# Patient Record
Sex: Female | Born: 1995 | Race: Black or African American | Hispanic: No | Marital: Single | Smoking: Never smoker
Health system: Southern US, Community
[De-identification: ages and names within clinical notes are randomized; demographics above are authoritative.]

## PROBLEM LIST (undated history)

## (undated) DIAGNOSIS — R0600 Dyspnea, unspecified: Secondary | ICD-10-CM

## (undated) DIAGNOSIS — K219 Gastro-esophageal reflux disease without esophagitis: Secondary | ICD-10-CM

## (undated) DIAGNOSIS — Z8679 Personal history of other diseases of the circulatory system: Secondary | ICD-10-CM

## (undated) DIAGNOSIS — R159 Full incontinence of feces: Secondary | ICD-10-CM

## (undated) DIAGNOSIS — J45909 Unspecified asthma, uncomplicated: Secondary | ICD-10-CM

## (undated) DIAGNOSIS — Z8709 Personal history of other diseases of the respiratory system: Secondary | ICD-10-CM

## (undated) DIAGNOSIS — K5900 Constipation, unspecified: Secondary | ICD-10-CM

## (undated) DIAGNOSIS — Z87440 Personal history of urinary (tract) infections: Secondary | ICD-10-CM

## (undated) DIAGNOSIS — G40309 Generalized idiopathic epilepsy and epileptic syndromes, not intractable, without status epilepticus: Secondary | ICD-10-CM

## (undated) DIAGNOSIS — M109 Gout, unspecified: Secondary | ICD-10-CM

## (undated) DIAGNOSIS — R32 Unspecified urinary incontinence: Secondary | ICD-10-CM

## (undated) DIAGNOSIS — R269 Unspecified abnormalities of gait and mobility: Secondary | ICD-10-CM

## (undated) DIAGNOSIS — R112 Nausea with vomiting, unspecified: Secondary | ICD-10-CM

## (undated) DIAGNOSIS — N318 Other neuromuscular dysfunction of bladder: Secondary | ICD-10-CM

## (undated) DIAGNOSIS — Z9889 Other specified postprocedural states: Secondary | ICD-10-CM

## (undated) DIAGNOSIS — G40209 Localization-related (focal) (partial) symptomatic epilepsy and epileptic syndromes with complex partial seizures, not intractable, without status epilepticus: Secondary | ICD-10-CM

## (undated) DIAGNOSIS — F840 Autistic disorder: Secondary | ICD-10-CM

## (undated) DIAGNOSIS — K5909 Other constipation: Secondary | ICD-10-CM

## (undated) DIAGNOSIS — F819 Developmental disorder of scholastic skills, unspecified: Secondary | ICD-10-CM

## (undated) DIAGNOSIS — G4733 Obstructive sleep apnea (adult) (pediatric): Secondary | ICD-10-CM

## (undated) DIAGNOSIS — F809 Developmental disorder of speech and language, unspecified: Secondary | ICD-10-CM

## (undated) DIAGNOSIS — R8271 Bacteriuria: Secondary | ICD-10-CM

## (undated) DIAGNOSIS — Z8489 Family history of other specified conditions: Secondary | ICD-10-CM

## (undated) DIAGNOSIS — F71 Moderate intellectual disabilities: Secondary | ICD-10-CM

## (undated) DIAGNOSIS — F919 Conduct disorder, unspecified: Secondary | ICD-10-CM

## (undated) DIAGNOSIS — N3010 Interstitial cystitis (chronic) without hematuria: Secondary | ICD-10-CM

## (undated) DIAGNOSIS — R569 Unspecified convulsions: Secondary | ICD-10-CM

## (undated) DIAGNOSIS — J189 Pneumonia, unspecified organism: Secondary | ICD-10-CM

## (undated) HISTORY — PX: TONSILLECTOMY: SUR1361

## (undated) HISTORY — DX: Unspecified asthma, uncomplicated: J45.909

## (undated) HISTORY — DX: Autistic disorder: F84.0

## (undated) HISTORY — PX: OTHER SURGICAL HISTORY: SHX169

---
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
## 2014-01-03 ENCOUNTER — Emergency Department (HOSPITAL_COMMUNITY)
Admission: EM | Admit: 2014-01-03 | Discharge: 2014-01-03 | Disposition: A | Discharge status: Home / Self Care | Payer: Medicaid Other | Attending: Emergency Medicine | Admitting: Emergency Medicine

## 2014-01-03 ENCOUNTER — Encounter (HOSPITAL_COMMUNITY): Payer: Self-pay | Admitting: Emergency Medicine

## 2014-01-03 DIAGNOSIS — Z8669 Personal history of other diseases of the nervous system and sense organs: Secondary | ICD-10-CM | POA: Diagnosis not present

## 2014-01-03 DIAGNOSIS — R0981 Nasal congestion: Secondary | ICD-10-CM | POA: Diagnosis present

## 2014-01-03 DIAGNOSIS — Z880 Allergy status to penicillin: Secondary | ICD-10-CM | POA: Insufficient documentation

## 2014-01-03 DIAGNOSIS — Z7951 Long term (current) use of inhaled steroids: Secondary | ICD-10-CM | POA: Diagnosis not present

## 2014-01-03 DIAGNOSIS — J069 Acute upper respiratory infection, unspecified: Secondary | ICD-10-CM

## 2014-01-03 DIAGNOSIS — K5900 Constipation, unspecified: Secondary | ICD-10-CM | POA: Diagnosis not present

## 2014-01-03 DIAGNOSIS — Z79899 Other long term (current) drug therapy: Secondary | ICD-10-CM | POA: Diagnosis not present

## 2014-01-03 DIAGNOSIS — J45909 Unspecified asthma, uncomplicated: Secondary | ICD-10-CM | POA: Insufficient documentation

## 2014-01-03 DIAGNOSIS — F840 Autistic disorder: Secondary | ICD-10-CM | POA: Insufficient documentation

## 2014-01-03 DIAGNOSIS — Z87448 Personal history of other diseases of urinary system: Secondary | ICD-10-CM | POA: Diagnosis not present

## 2014-01-03 DIAGNOSIS — G40909 Epilepsy, unspecified, not intractable, without status epilepticus: Secondary | ICD-10-CM | POA: Diagnosis not present

## 2014-01-03 DIAGNOSIS — F79 Unspecified intellectual disabilities: Secondary | ICD-10-CM | POA: Insufficient documentation

## 2014-01-03 LAB — RAPID STREP SCREEN (MED CTR MEBANE ONLY): Streptococcus, Group A Screen (Direct): NEGATIVE

## 2014-01-03 MED ORDER — DEXAMETHASONE 4 MG PO TABS
12.0000 mg | ORAL_TABLET | Freq: Once | ORAL | Status: AC
  Administered 2014-01-03: 12 mg via ORAL
  Filled 2014-01-03: qty 3

## 2014-01-03 NOTE — ED Provider Notes (Signed)
CSN: [id number]     Arrival date & time 01/03/14  0441 History   First MD Initiated Contact with Patient 01/03/14 [phone]     Chief Complaint  Patient presents with  . Asthma     (Consider location/radiation/quality/duration/timing/severity/associated sxs/prior Treatment) Patient is a 18 y.o. female presenting with asthma. The history is provided by a parent. The history is limited by the condition of the patient (Mental Retardation).  Asthma  Mother states that she was out walking yesterday. When she came home, she had nasal congestion with rhinorrhea and noted a whistling in her nose. She had been told in the past and whenever she had whistling in her nose that she should be seen in the ED. Nasal drainage has been clear. She has a low-grade fever as high as 100.0. There has been no cough and no vomiting or diarrhea. Mother treated her with albuterol and Pulmicort but was concerned because of the ongoing whistling. Patient also complained of a sore throat. There's been no vomiting or diarrhea. There have been sick contacts of family members who have sinusitis.  Past Medical History  Diagnosis Date  . CP (cerebral palsy)     mom denies  . Asthma   . Mental deficiency     hx of aggressive behavior,impaired speech   . Constipation   . Allergy   . Autism     mom states no actual dx, but admits she has some symptoms  . Frequency-urgency syndrome      some incontinence ; wears pull-ups  . Seizure     most recent sz " at the end of summer"   Past Surgical History  Procedure Laterality Date  . Cystoscopy    . Cystoscopy with hydrodistension and biopsy  04/14/2012    Procedure: CYSTOSCOPY/BIOPSY/HYDRODISTENSION;  Surgeon: Lindaann SloughMarc-Henry Nesi, MD;  [facility];  Service: Urology;;  instillation of marcaine and pyridium   Family History  Problem Relation Age of Onset  . Seizures Mother   . Seizures Sister     1 sister has   History  Substance Use Topics  . Smoking  status: Never Smoker   . Smokeless tobacco: Never Used  . Alcohol Use: No   OB History   Grav Para Term Preterm Abortions TAB SAB Ect Mult Living                 Review of Systems  All other systems reviewed and are negative.     Allergies  Abilify; Seroquel; Amoxicillin-pot clavulanate; and Keppra  Home Medications   Prior to Admission medications   Medication Sig Start Date End Date Taking? Authorizing Provider  albuterol (PROVENTIL) (2.5 MG/3ML) 0.083% nebulizer solution Take 2.5 mg by nebulization every 6 (six) hours as needed. Shortness of breath    Historical Provider, MD  budesonide (PULMICORT) 0.5 MG/2ML nebulizer solution Take 0.25 mg by nebulization every morning. Per Dr. Willa RoughHicks, Allergist    Historical Provider, MD  cetirizine (ZYRTEC) 10 MG tablet Take 10 mg by mouth daily.  08/07/11   Historical Provider, MD  cloNIDine (CATAPRES) 0.1 MG tablet Give 1 at bedtime 12/22/13   Keturah Shaverseza Nabizadeh, MD  Cranberry 500 MG CAPS Take 500 mg by mouth daily.    Historical Provider, MD  DEPAKOTE 500 MG DR tablet Take 1 tablet twice per day 11/11/13   Elveria Risingina Goodpasture, NP  Levonorgestrel-Ethinyl Estradiol (SEASONIQUE) 0.15-0.03 &0.01 MG tablet Take 1 tablet by mouth daily. Take 1 active tablet daily, skip non active pills. 10/27/13  Antionette CharLisa Jackson-Moore, MD  Melatonin 3 MG TABS Take 3 mg by mouth at bedtime. For insomnia    Historical Provider, MD  Meth-Hyo-M Bl-Na Phos-Ph Sal (URIBEL) 118 MG CAPS Take 1 capsule by mouth 2 (two) times daily.    Historical Provider, MD  montelukast (SINGULAIR) 5 MG chewable tablet Chew 5 mg by mouth daily.    Historical Provider, MD  Pediatric Multivit-Minerals-C (MULTIVITAMIN GUMMIES CHILDRENS PO) Take by mouth daily. Chew 2 by mouth every morning.    Historical Provider, MD  polyethylene glycol powder (MIRALAX) powder Take 17 g by mouth 2 (two) times daily. 05/26/13 05/27/14  Jon GillsJoseph H Clark, MD  senna (SENOKOT) 8.6 MG TABS tablet Take 1 tablet (8.6 mg total) by  mouth daily. 09/08/13 09/09/14  Jon GillsJoseph H Clark, MD  traZODone (DESYREL) 50 MG tablet Take 2 tabs by mouth at bedtime 09/15/13   Elveria Risingina Goodpasture, NP  trimethoprim (TRIMPEX) 100 MG tablet Take 100 mg by mouth daily.    Historical Provider, MD  vitamin C (ASCORBIC ACID) 500 MG tablet Take 500 mg by mouth daily.    Historical Provider, MD   BP 114/60  Pulse 107  Temp(Src) 98.2 F (36.8 C) (Oral)  Resp 16  Ht 4\' 9"  (1.448 m)  Wt 157 lb (71.215 kg)  BMI 33.97 kg/m2  SpO2 100% Physical Exam  Nursing note and vitals reviewed.  18 year old female, resting comfortably and in no acute distress. Vital signs are significant for mild tachycardia. Oxygen saturation is 100%, which is normal. Head is normocephalic and atraumatic. PERRLA, EOMI. Oropharynx is mildly erythematous. TMs are clear. Turbinates are moderately edematous. Neck is nontender and supple without adenopathy or JVD. Back is nontender and there is no CVA tenderness. Lungs are clear without rales, wheezes, or rhonchi. Chest is nontender. Heart has regular rate and rhythm without murmur. Abdomen is soft, flat, nontender without masses or hepatosplenomegaly and peristalsis is normoactive. Extremities have no cyanosis or edema, full range of motion is present. Skin is warm and dry without rash. Neurologic: She is alert and cooperative but nonverbal, cranial nerves are intact, there are no motor or sensory deficits.  ED Course  Procedures (including critical care time) Labs Review Results for orders placed during the hospital encounter of 01/03/14  RAPID STREP SCREEN      Result Value Ref Range   Streptococcus, Group A Screen (Direct) NEGATIVE  NEGATIVE   MDM   Final diagnoses:  URI (upper respiratory infection)    Upper respiratory infection. Strep screen will be obtained and she was given a dose of dexamethasone. I do not see evidence of asthma exacerbation.  Strep screen has come back negative and she is discharged. Mother is  advised to continue symptomatic treatment of URI. No indication for antibiotics at this time.  Dione Boozeavid Stavros Cail, MD 01/03/14 [phone]

## 2014-01-03 NOTE — ED Notes (Signed)
Per mother, pt having an asthma exacerbation, mom states she has a "whistling" in her nose since coming home from her weekend respite care.

## 2014-01-03 NOTE — Discharge Instructions (Signed)
Upper Respiratory Infection, Adult An upper respiratory infection (URI) is also sometimes known as the common cold. The upper respiratory tract includes the nose, sinuses, throat, trachea, and bronchi. Bronchi are the airways leading to the lungs. Most people improve within 1 week, but symptoms can last up to 2 weeks. A residual cough may last even longer.  CAUSES Many different viruses can infect the tissues lining the upper respiratory tract. The tissues become irritated and inflamed and often become very moist. Mucus production is also common. A cold is contagious. You can easily spread the virus to others by oral contact. This includes kissing, sharing a glass, coughing, or sneezing. Touching your mouth or nose and then touching a surface, which is then touched by another person, can also spread the virus. SYMPTOMS  Symptoms typically develop 1 to 3 days after you come in contact with a cold virus. Symptoms vary from person to person. They may include:  Runny nose.  Sneezing.  Nasal congestion.  Sinus irritation.  Sore throat.  Loss of voice (laryngitis).  Cough.  Fatigue.  Muscle aches.  Loss of appetite.  Headache.  Low-grade fever. DIAGNOSIS  You might diagnose your own cold based on familiar symptoms, since most people get a cold 2 to 3 times a year. Your caregiver can confirm this based on your exam. Most importantly, your caregiver can check that your symptoms are not due to another disease such as strep throat, sinusitis, pneumonia, asthma, or epiglottitis. Blood tests, throat tests, and X-rays are not necessary to diagnose a common cold, but they may sometimes be helpful in excluding other more serious diseases. Your caregiver will decide if any further tests are required. RISKS AND COMPLICATIONS  You may be at risk for a more severe case of the common cold if you smoke cigarettes, have chronic heart disease (such as heart failure) or lung disease (such as asthma), or if  you have a weakened immune system. The very young and very old are also at risk for more serious infections. Bacterial sinusitis, middle ear infections, and bacterial pneumonia can complicate the common cold. The common cold can worsen asthma and chronic obstructive pulmonary disease (COPD). Sometimes, these complications can require emergency medical care and may be life-threatening. PREVENTION  The best way to protect against getting a cold is to practice good hygiene. Avoid oral or hand contact with people with cold symptoms. Wash your hands often if contact occurs. There is no clear evidence that vitamin C, vitamin E, echinacea, or exercise reduces the chance of developing a cold. However, it is always recommended to get plenty of rest and practice good nutrition. TREATMENT  Treatment is directed at relieving symptoms. There is no cure. Antibiotics are not effective, because the infection is caused by a virus, not by bacteria. Treatment may include:  Increased fluid intake. Sports drinks offer valuable electrolytes, sugars, and fluids.  Breathing heated mist or steam (vaporizer or shower).  Eating chicken soup or other clear broths, and maintaining good nutrition.  Getting plenty of rest.  Using gargles or lozenges for comfort.  Controlling fevers with ibuprofen or acetaminophen as directed by your caregiver.  Increasing usage of your inhaler if you have asthma. Zinc gel and zinc lozenges, taken in the first 24 hours of the common cold, can shorten the duration and lessen the severity of symptoms. Pain medicines may help with fever, muscle aches, and throat pain. A variety of non-prescription medicines are available to treat congestion and runny nose. Your caregiver   can make recommendations and may suggest nasal or lung inhalers for other symptoms.  HOME CARE INSTRUCTIONS   Only take over-the-counter or prescription medicines for pain, discomfort, or fever as directed by your  caregiver.  Use a warm mist humidifier or inhale steam from a shower to increase air moisture. This may keep secretions moist and make it easier to breathe.  Drink enough water and fluids to keep your urine clear or pale yellow.  Rest as needed.  Return to work when your temperature has returned to normal or as your caregiver advises. You may need to stay home longer to avoid infecting others. You can also use a face mask and careful hand washing to prevent spread of the virus. SEEK MEDICAL CARE IF:   After the first few days, you feel you are getting worse rather than better.  You need your caregiver's advice about medicines to control symptoms.  You develop chills, worsening shortness of breath, or brown or red sputum. These may be signs of pneumonia.  You develop yellow or brown nasal discharge or pain in the face, especially when you bend forward. These may be signs of sinusitis.  You develop a fever, swollen neck glands, pain with swallowing, or white areas in the back of your throat. These may be signs of strep throat. SEEK IMMEDIATE MEDICAL CARE IF:   You have a fever.  You develop severe or persistent headache, ear pain, sinus pain, or chest pain.  You develop wheezing, a prolonged cough, cough up blood, or have a change in your usual mucus (if you have chronic lung disease).  You develop sore muscles or a stiff neck. Document Released: 08/28/2000 Document Revised: 05/27/2011 Document Reviewed: 06/09/2013 Exit[employer] Patient Information 2015 [employer]. This information is not intended to replace advice given to you by your health care provider. Make sure you discuss any questions you have with your health care provider.  

## 2014-01-05 LAB — CULTURE, GROUP A STREP

## 2014-01-12 ENCOUNTER — Other Ambulatory Visit: Payer: Self-pay | Admitting: *Deleted

## 2014-01-12 DIAGNOSIS — B379 Candidiasis, unspecified: Secondary | ICD-10-CM

## 2014-01-12 MED ORDER — FLUCONAZOLE 150 MG PO TABS: 150.0000 mg | ORAL_TABLET | Freq: Once | ORAL | Status: DC

## 2014-01-12 NOTE — Progress Notes (Signed)
Pt mother call to office stating that her daughter has yeast infection.  Pt mother request Diflucan be sent to pharmacy. Return call to pt mother making her aware that Diflucan will be sent to pharmacy.  Pt mother advised to contact office if no change after treatment.

## 2014-02-13 ENCOUNTER — Emergency Department (HOSPITAL_COMMUNITY)
Admission: EM | Admit: 2014-02-13 | Discharge: 2014-02-13 | Disposition: A | Discharge status: Home / Self Care | Payer: Medicaid Other | Attending: Emergency Medicine | Admitting: Emergency Medicine

## 2014-02-13 ENCOUNTER — Encounter (HOSPITAL_COMMUNITY): Payer: Self-pay | Admitting: Emergency Medicine

## 2014-02-13 DIAGNOSIS — Z793 Long term (current) use of hormonal contraceptives: Secondary | ICD-10-CM | POA: Diagnosis not present

## 2014-02-13 DIAGNOSIS — G40909 Epilepsy, unspecified, not intractable, without status epilepticus: Secondary | ICD-10-CM | POA: Insufficient documentation

## 2014-02-13 DIAGNOSIS — F840 Autistic disorder: Secondary | ICD-10-CM | POA: Insufficient documentation

## 2014-02-13 DIAGNOSIS — K5900 Constipation, unspecified: Secondary | ICD-10-CM | POA: Insufficient documentation

## 2014-02-13 DIAGNOSIS — Z79899 Other long term (current) drug therapy: Secondary | ICD-10-CM | POA: Insufficient documentation

## 2014-02-13 DIAGNOSIS — N3090 Cystitis, unspecified without hematuria: Secondary | ICD-10-CM | POA: Insufficient documentation

## 2014-02-13 DIAGNOSIS — Z880 Allergy status to penicillin: Secondary | ICD-10-CM | POA: Diagnosis not present

## 2014-02-13 DIAGNOSIS — Z87440 Personal history of urinary (tract) infections: Secondary | ICD-10-CM | POA: Diagnosis not present

## 2014-02-13 DIAGNOSIS — J45909 Unspecified asthma, uncomplicated: Secondary | ICD-10-CM | POA: Insufficient documentation

## 2014-02-13 DIAGNOSIS — R300 Dysuria: Secondary | ICD-10-CM | POA: Diagnosis present

## 2014-02-13 LAB — URINE MICROSCOPIC-ADD ON

## 2014-02-13 LAB — URINALYSIS, ROUTINE W REFLEX MICROSCOPIC
BILIRUBIN URINE: NEGATIVE
Glucose, UA: NEGATIVE mg/dL
HGB URINE DIPSTICK: NEGATIVE
KETONES UR: NEGATIVE mg/dL
Nitrite: NEGATIVE
PH: 6 (ref 5.0–8.0)
Protein, ur: NEGATIVE mg/dL
SPECIFIC GRAVITY, URINE: 1.031 — AB (ref 1.005–1.030)
Urobilinogen, UA: 0.2 mg/dL (ref 0.0–1.0)

## 2014-02-13 MED ORDER — CEPHALEXIN 250 MG PO CAPS: 250.0000 mg | ORAL_CAPSULE | Freq: Three times a day (TID) | ORAL | Status: DC

## 2014-02-13 NOTE — ED Provider Notes (Signed)
CSN: [id number]     Arrival date & time 02/13/14  1316 History   First MD Initiated Contact with Patient 02/13/14 1352     Chief Complaint  Patient presents with  . Dysuria   Lori Miles is a 18 y.o. female with a history of intellectual disability, epilepsy, chronic constipation and recurrent UTIs brought in by parents for evaluation for UTI.   History from mother includes uncharacteristic behavior from Lori Miles today. She has had odd smelling urine without hematuria and with increased urinary frequency. She's been reaching to her groin and having more aggressive behavior at lunch today so they brought her in to the ED. She has had no fevers, doesn't seem to have abdominal or back pain. No vaginal bleeding or discharge, sores or redness.   She has chronic constipation and recurrent UTIs on stool softeners/laxative as well as suppressive abx (trimethoprim) and has not had any missed doses.   (Consider location/radiation/quality/duration/timing/severity/associated sxs/prior Treatment) HPI  Past Medical History  Diagnosis Date  . CP (cerebral palsy)     mom denies  . Asthma   . Mental deficiency     hx of aggressive behavior,impaired speech   . Constipation   . Allergy   . Autism     mom states no actual dx, but admits she has some symptoms  . Frequency-urgency syndrome      some incontinence ; wears pull-ups  . Seizure     most recent sz " at the end of summer"   Past Surgical History  Procedure Laterality Date  . Cystoscopy    . Cystoscopy with hydrodistension and biopsy  04/14/2012    Procedure: CYSTOSCOPY/BIOPSY/HYDRODISTENSION;  Surgeon: Lindaann SloughMarc-Henry Nesi, MD;  [facility] [place];  Service: Urology;;  instillation of marcaine and pyridium   Family History  Problem Relation Age of Onset  . Seizures Mother   . Seizures Sister     1 sister has   History  Substance Use Topics  . Smoking status: Never Smoker   . Smokeless tobacco: Never Used  . Alcohol  Use: No   OB History    No data available     Review of Systems  Constitutional: Negative for fever, chills, diaphoresis and fatigue.  HENT: Negative for congestion and drooling.   Eyes: Negative for redness and itching.  Respiratory: Negative for cough, shortness of breath and wheezing.   Cardiovascular: Negative for leg swelling.  Gastrointestinal: Positive for abdominal pain and constipation. Negative for nausea, vomiting, diarrhea, blood in stool and abdominal distention.  Endocrine: Positive for polyuria. Negative for polydipsia.  Genitourinary: Positive for frequency and enuresis. Negative for hematuria, genital sores and menstrual problem.  Musculoskeletal: Negative for joint swelling and neck stiffness.  Skin: Negative for rash.  Neurological: Negative for syncope.  Psychiatric/Behavioral: Positive for agitation. Negative for self-injury. The patient is not hyperactive.    Allergies  Abilify; Seroquel; Amoxicillin-pot clavulanate; and Keppra  Home Medications   Prior to Admission medications   Medication Sig Start Date End Date Taking? Authorizing Provider  albuterol (PROVENTIL) (2.5 MG/3ML) 0.083% nebulizer solution Take 2.5 mg by nebulization every 6 (six) hours as needed for shortness of breath.    Yes Historical Provider, MD  budesonide (PULMICORT) 0.5 MG/2ML nebulizer solution Take 0.25 mg by nebulization every morning. Per Dr. Willa RoughHicks, Allergist   Yes Historical Provider, MD  cetirizine (ZYRTEC) 10 MG tablet Take 10 mg by mouth daily.  08/07/11  Yes Historical Provider, MD  cloNIDine (CATAPRES) 0.1 MG tablet Give  1 at bedtime 12/22/13  Yes Keturah Shaverseza Nabizadeh, MD  Cranberry 500 MG CAPS Take 500 mg by mouth daily.   Yes Historical Provider, MD  DEPAKOTE 500 MG DR tablet Take 1 tablet twice per day Patient taking differently: Take 500 mg by mouth 2 (two) times daily.  11/11/13  Yes Elveria Risingina Goodpasture, NP  Levonorgestrel-Ethinyl Estradiol (SEASONIQUE) 0.15-0.03 &0.01 MG tablet Take 1  tablet by mouth daily. Take 1 active tablet daily, skip non active pills. 10/27/13  Yes Antionette CharLisa Jackson-Moore, MD  Melatonin 3 MG TABS Take 3 mg by mouth at bedtime. For insomnia   Yes Historical Provider, MD  Meth-Hyo-M Bl-Na Phos-Ph Sal (URIBEL) 118 MG CAPS Take 1 capsule by mouth 2 (two) times daily.   Yes Historical Provider, MD  montelukast (SINGULAIR) 5 MG chewable tablet Chew 5 mg by mouth daily.   Yes Historical Provider, MD  Pediatric Multivit-Minerals-C (MULTIVITAMIN GUMMIES CHILDRENS PO) Take by mouth daily. Chew 2 by mouth every morning.   Yes Historical Provider, MD  polyethylene glycol powder (MIRALAX) powder Take 17 g by mouth 2 (two) times daily. 05/26/13 05/27/14 Yes Jon GillsJoseph H Clark, MD  senna (SENOKOT) 8.6 MG TABS tablet Take 1 tablet (8.6 mg total) by mouth daily. 09/08/13 09/09/14 Yes Jon GillsJoseph H Clark, MD  traZODone (DESYREL) 50 MG tablet Take 2 tabs by mouth at bedtime 09/15/13  Yes Elveria Risingina Goodpasture, NP  trimethoprim (TRIMPEX) 100 MG tablet Take 100 mg by mouth daily.   Yes Historical Provider, MD  vitamin C (ASCORBIC ACID) 500 MG tablet Take 500 mg by mouth daily.   Yes Historical Provider, MD  cephALEXin (KEFLEX) 250 MG capsule Take 1 capsule (250 mg total) by mouth 3 (three) times daily. 02/13/14   Tyrone Nineyan B Salwa Bai, MD  fluconazole (DIFLUCAN) 150 MG tablet Take 1 tablet (150 mg total) by mouth once. Every other day. Patient not taking: Reported on 02/13/2014 01/12/14   Antionette CharLisa Jackson-Moore, MD   BP 90/76 mmHg  Pulse 100  Temp(Src) 97.9 F (36.6 C) (Oral)  Resp 20  SpO2 98% Physical Exam  Constitutional: She is oriented to person, place, and time. No distress.  HENT:  Mouth/Throat: Oropharynx is clear and moist. No oropharyngeal exudate.  Eyes: Conjunctivae and EOM are normal. Pupils are equal, round, and reactive to light.  Neck: Normal range of motion. Neck supple.  Cardiovascular: Normal rate, regular rhythm and intact distal pulses.   No murmur heard. Pulmonary/Chest: No  respiratory distress.  Abdominal: Soft. Bowel sounds are normal. She exhibits no distension and no mass. There is tenderness (suprapubic tenderness). There is no guarding.  Palpable stool burden in LLQ.   Musculoskeletal: She exhibits no edema.  Neurological: She is alert and oriented to person, place, and time.  Skin: Skin is warm and dry. No rash noted. She is not diaphoretic.  Vitals reviewed.  ED Course  Procedures (including critical care time) Labs Review Labs Reviewed  URINE CULTURE  URINALYSIS, ROUTINE W REFLEX MICROSCOPIC    Imaging Review No results found.   EKG Interpretation None      MDM   Final diagnoses:  Cystitis    18 y.o. girl with behavioral symptoms consistent with past UTIs.  - Keflex 250mg  TID x7 days    Tyrone Nineyan B Christobal Morado, MD 02/13/14 1430  Tyrone Nineyan B Taqwa Deem, MD 02/13/14 1432  Nelia Shiobert L Beaton, MD 02/13/14 [phone]

## 2014-02-13 NOTE — ED Notes (Signed)
Pt c/o dysuria. Mother reports that after eating today patient started shaking leg and moving arm and this normally means she has a urinary tract infection. She reports that patient has been grabbing in between her legs which makes her think urination is painful. HX of autism.

## 2014-02-13 NOTE — Discharge Instructions (Signed)

## 2014-02-13 NOTE — ED Notes (Signed)
Pt mother sts that pt has hx UTI and is on medication all the time. Pt now grabbing her genital are which mother sts she does when she has UTI. Pt is eating and drinking ok. Pt is in NAD. Pt has hx of autism

## 2014-02-15 LAB — URINE CULTURE

## 2014-02-22 ENCOUNTER — Ambulatory Visit (INDEPENDENT_AMBULATORY_CARE_PROVIDER_SITE_OTHER): Payer: Medicaid Other | Admitting: Family

## 2014-02-22 ENCOUNTER — Encounter: Payer: Self-pay | Admitting: Family

## 2014-02-22 VITALS — BP 90/60 | HR 92 | Ht 58.25 in | Wt 171.8 lb

## 2014-02-22 DIAGNOSIS — G40309 Generalized idiopathic epilepsy and epileptic syndromes, not intractable, without status epilepticus: Secondary | ICD-10-CM

## 2014-02-22 DIAGNOSIS — E669 Obesity, unspecified: Secondary | ICD-10-CM

## 2014-02-22 DIAGNOSIS — F819 Developmental disorder of scholastic skills, unspecified: Secondary | ICD-10-CM

## 2014-02-22 DIAGNOSIS — G40209 Localization-related (focal) (partial) symptomatic epilepsy and epileptic syndromes with complex partial seizures, not intractable, without status epilepticus: Secondary | ICD-10-CM

## 2014-02-22 NOTE — Progress Notes (Signed)
Patient: Lori Miles MRN: [id number] Sex: female DOB: 1995/06/29  Provider: Elveria RisingGOODPASTURE, Amayia Ciano, NP Location of Care: [facility]  Note type: Routine return visit  History of Present Illness: Referral Source: Dr. Fleet ContrasEdwin Avbuere History from: her mother Chief Complaint: Epilepsy  Lori Miles is a 18 y.o. girl with history of localization related seizures with secondary generalization, intellectual disabilities, episodic incontinence, and insomnia. She was last seen by Dr Sharene SkeansHickling on August 23, 2013. She has been seizure free since she was last seen. Mom says that she is sleeping better on the combination of Clonidine and Trazodone. Lori Miles has been steadily gaining weight, and Mom has been working to limit her intake both at meals and with snacks. She has a large appetite and in fact has gained 13 lbs since last seen She has been otherwise generally healthy since last seen.   Lori Miles is making slow progress at school. Mom says that there are no new problems with behavior. She is impulsive and has extremely limited understanding of consequences of actions. Mom says that she is unable to dress herself without assistance. She can go to the toilet by herself but needs help with toilet hygiene. She is unable to bathe herself or administer medications to herself.   Review of Systems: 12 system review was remarkable for UTI and constipation  Past Medical History  Diagnosis Date  . CP (cerebral palsy)     mom denies  . Asthma   . Mental deficiency     hx of aggressive behavior,impaired speech   . Constipation   . Allergy   . Autism     mom states no actual dx, but admits she has some symptoms  . Frequency-urgency syndrome      some incontinence ; wears pull-ups  . Seizure     most recent sz " at the end of summer"   Hospitalizations: No., Head Injury: No., Nervous System Infections: No., Immunizations up to date: Yes.   Past Medical History Comments: Patient has been seen  in the ER due to UTI.  EEG on September 14, 2011, was normal. She was able to be successfully weaned off Keppra. The patient has significant issues with aggression, which was thought to be related to Keppra, but has persisted once it was discontinued. The patient was treated with Depakote both for behavior and possible seizures. She had palpitations and severe constipation on Seroquel. She had marked weight gain on Abilify and Depakote. When generic Divalproex was tried she had breakthrough seizures.   Nocturnal polysomnogram on October 27, 2011, failed to show significant periodic limb movements, cyanosis, sleep apnea, or cardiac arrhythmia. EEG on February 20, 2012, was a normal study with the patient awake.  Surgical History Past Surgical History  Procedure Laterality Date  . Cystoscopy    . Cystoscopy with hydrodistension and biopsy  04/14/2012    Procedure: CYSTOSCOPY/BIOPSY/HYDRODISTENSION;  Surgeon: Lindaann SloughMarc-Henry Nesi, MD;  [facility];  Service: Urology;;  instillation of marcaine and pyridium    Family History family history includes Seizures in her mother and sister. Family History is otherwise negative for migraines, seizures, cognitive impairment, blindness, deafness, birth defects, chromosomal disorder, autism.  Social History History   Social History  . Marital Status: Single    Spouse Name: N/A    Number of Children: N/A  . Years of Education: N/A   Social History Main Topics  . Smoking status: Never Smoker   . Smokeless tobacco: Never Used  . Alcohol Use: No  .  Drug Use: No  . Sexual Activity: No   Other Topics Concern  . None   Social History Narrative   Lives with mom Research scientist (physical sciences)([name]) and half-sister [name](Nancy Loeb) who is also mentally impaired.  Has CAP assistance, urinary incontinence, needs help with feeding,dressing, toileting   Goes to [employer].  In the 9th grade.   Educational level: 11th grade  School Attending:[employer] Living with:  mother and siblings  Hobbies/Interest: watching movies, reading and music School comments:  Lori Miles is making slow progress.  Physical Exam BP 90/60 mmHg  Pulse 92  Ht 4' 10.25" (1.48 m)  Wt 171 lb 12.8 oz (77.928 kg)  BMI 35.58 kg/m2 General: alert, well developed, obese with short stature, in no acute distress, black hair, brown eyes, right handed  Head: normocephalic, no dysmorphic features  Ears, Nose and Throat: Otoscopic: Tympanic membranes normal. Pharynx: oropharynx is pink without exudates or tonsillar hypertrophy.  Neck: supple, full range of motion, no cranial or cervical bruits  Respiratory: auscultation clear  Cardiovascular: no murmurs, pulses are normal  Musculoskeletal: no skeletal deformities or apparent scoliosis  Skin: no rashes or neurocutaneous lesions  Neurologic Exam  Mental Status: alert; she is able to follow some commands. She is able to name objects. She is impulsive. She asked me questions at times such as "are you married".  Cranial Nerves: visual fields are full to double simultaneous stimuli; extraocular movements are full and conjugate; pupils are round reactive to light; funduscopic examination shows sharp disc margins with normal vessels; symmetric facial strength; midline tongue and uvula; air conduction is greater than bone conduction bilaterally.  Motor: Normal strength, tone, and mass; good fine motor movements; no pronator drift.  Sensory: intact responses to touch and temperature Coordination: good finger-to-nose, rapid repetitive alternating movements and finger apposition  Gait and Station: Gait is slightly broad-based but not unsteady; patient is able to walk on heels, toes and tandem without difficulty; balance is poor; Romberg exam is negative; Gower response is negative  Reflexes: symmetric and diminished bilaterally; no clonus; bilateral flexor plantar responses.  Assessment and Plan Lori Miles is a 18  year old girl with history of localization related seizures with secondary generalization, intellectual disabilities, episodic incontinence, and insomnia. She has been seizure free on Depakote since her last visit. Lori Miles has been gaining weight and I talked with her mother about this problem. She continues to work on limiting her portions and snacks, and trying to help Lilyth be more active. Dr Sharene SkeansHickling was consulted and came in to talk to her mother. Lori Miles will return for follow up in 6 months or sooner if needed.

## 2014-02-24 ENCOUNTER — Encounter: Payer: Self-pay | Admitting: Family

## 2014-02-24 NOTE — Patient Instructions (Signed)
Continue Le's medications without change.  I am concerned about Lori Miles's weight gain as she has gained an additional 13 lbs today. Continue to work on limiting her portion sizes and her snacks. Help Lori Miles to be more physically active. Here are her weights from the last 3 visits: Wt Readings from Last 3 Encounters:  02/22/14 171 lb 12.8 oz (77.928 kg) (93 %*, Z = 1.44)  01/03/14 157 lb (71.215 kg) (86 %*, Z = 1.09)  09/13/13 160 lb (72.576 kg) (88 %*, Z = 1.20)   * Growth percentiles are based on CDC 2-20 Years data.    Please plan to return for follow up in 6 months or sooner if needed.

## 2014-02-27 ENCOUNTER — Emergency Department (HOSPITAL_COMMUNITY)
Admission: EM | Admit: 2014-02-27 | Discharge: 2014-02-28 | Disposition: A | Discharge status: Home / Self Care | Payer: Medicaid Other | Attending: Emergency Medicine | Admitting: Emergency Medicine

## 2014-02-27 ENCOUNTER — Encounter (HOSPITAL_COMMUNITY): Payer: Self-pay | Admitting: Emergency Medicine

## 2014-02-27 DIAGNOSIS — Z79899 Other long term (current) drug therapy: Secondary | ICD-10-CM | POA: Insufficient documentation

## 2014-02-27 DIAGNOSIS — Z880 Allergy status to penicillin: Secondary | ICD-10-CM | POA: Insufficient documentation

## 2014-02-27 DIAGNOSIS — K5900 Constipation, unspecified: Secondary | ICD-10-CM | POA: Diagnosis not present

## 2014-02-27 DIAGNOSIS — R062 Wheezing: Secondary | ICD-10-CM

## 2014-02-27 DIAGNOSIS — R112 Nausea with vomiting, unspecified: Secondary | ICD-10-CM | POA: Diagnosis not present

## 2014-02-27 DIAGNOSIS — Z3202 Encounter for pregnancy test, result negative: Secondary | ICD-10-CM | POA: Insufficient documentation

## 2014-02-27 DIAGNOSIS — Z792 Long term (current) use of antibiotics: Secondary | ICD-10-CM | POA: Insufficient documentation

## 2014-02-27 DIAGNOSIS — F840 Autistic disorder: Secondary | ICD-10-CM | POA: Diagnosis not present

## 2014-02-27 DIAGNOSIS — J45901 Unspecified asthma with (acute) exacerbation: Secondary | ICD-10-CM | POA: Diagnosis not present

## 2014-02-27 DIAGNOSIS — Z87448 Personal history of other diseases of urinary system: Secondary | ICD-10-CM | POA: Insufficient documentation

## 2014-02-27 DIAGNOSIS — R109 Unspecified abdominal pain: Secondary | ICD-10-CM | POA: Insufficient documentation

## 2014-02-27 DIAGNOSIS — G809 Cerebral palsy, unspecified: Secondary | ICD-10-CM | POA: Insufficient documentation

## 2014-02-27 DIAGNOSIS — Z87440 Personal history of urinary (tract) infections: Secondary | ICD-10-CM | POA: Diagnosis not present

## 2014-02-27 DIAGNOSIS — G40909 Epilepsy, unspecified, not intractable, without status epilepticus: Secondary | ICD-10-CM | POA: Insufficient documentation

## 2014-02-27 LAB — URINALYSIS, ROUTINE W REFLEX MICROSCOPIC
BILIRUBIN URINE: NEGATIVE
GLUCOSE, UA: NEGATIVE mg/dL
HGB URINE DIPSTICK: NEGATIVE
KETONES UR: NEGATIVE mg/dL
Leukocytes, UA: NEGATIVE
Nitrite: NEGATIVE
PROTEIN: NEGATIVE mg/dL
Specific Gravity, Urine: 1.039 — ABNORMAL HIGH (ref 1.005–1.030)
Urobilinogen, UA: 0.2 mg/dL (ref 0.0–1.0)
pH: 6.5 (ref 5.0–8.0)

## 2014-02-27 LAB — CBC WITH DIFFERENTIAL/PLATELET
Basophils Absolute: 0 10*3/uL (ref 0.0–0.1)
Basophils Relative: 0 % (ref 0–1)
Eosinophils Absolute: 0 10*3/uL (ref 0.0–0.7)
Eosinophils Relative: 0 % (ref 0–5)
HCT: 35.8 % — ABNORMAL LOW (ref 36.0–46.0)
HEMOGLOBIN: 11.7 g/dL — AB (ref 12.0–15.0)
LYMPHS ABS: 3.2 10*3/uL (ref 0.7–4.0)
Lymphocytes Relative: 25 % (ref 12–46)
MCH: 29.5 pg (ref 26.0–34.0)
MCHC: 32.7 g/dL (ref 30.0–36.0)
MCV: 90.4 fL (ref 78.0–100.0)
MONOS PCT: 16 % — AB (ref 3–12)
Monocytes Absolute: 2 10*3/uL — ABNORMAL HIGH (ref 0.1–1.0)
Neutro Abs: 7.3 10*3/uL (ref 1.7–7.7)
Neutrophils Relative %: 59 % (ref 43–77)
Platelets: 270 10*3/uL (ref 150–400)
RBC: 3.96 MIL/uL (ref 3.87–5.11)
RDW: 13.3 % (ref 11.5–15.5)
WBC: 12.6 10*3/uL — AB (ref 4.0–10.5)

## 2014-02-27 LAB — PREGNANCY, URINE: Preg Test, Ur: NEGATIVE

## 2014-02-27 LAB — COMPREHENSIVE METABOLIC PANEL
ALK PHOS: 49 U/L (ref 39–117)
ALT: 33 U/L (ref 0–35)
AST: 34 U/L (ref 0–37)
Albumin: 3 g/dL — ABNORMAL LOW (ref 3.5–5.2)
Anion gap: 21 — ABNORMAL HIGH (ref 5–15)
BUN: 19 mg/dL (ref 6–23)
CHLORIDE: 104 meq/L (ref 96–112)
CO2: 21 mEq/L (ref 19–32)
Calcium: 8.8 mg/dL (ref 8.4–10.5)
Creatinine, Ser: 0.8 mg/dL (ref 0.50–1.10)
Glucose, Bld: 95 mg/dL (ref 70–99)
POTASSIUM: 4.2 meq/L (ref 3.7–5.3)
SODIUM: 146 meq/L (ref 137–147)
Total Protein: 6.4 g/dL (ref 6.0–8.3)

## 2014-02-27 LAB — LIPASE, BLOOD: Lipase: 17 U/L (ref 11–59)

## 2014-02-27 MED ORDER — SODIUM CHLORIDE 0.9 % IV BOLUS (SEPSIS)
1000.0000 mL | Freq: Once | INTRAVENOUS | Status: AC
  Administered 2014-02-28: 1000 mL via INTRAVENOUS

## 2014-02-27 NOTE — ED Notes (Signed)
Awake. Verbally responsive. A/O. Resp even and unlabored. No audible adventitious breath sounds noted at this time. Mother reported pt having audible wheezing and pt needing breathing tx and to have throat swab.  ABC's intact.

## 2014-02-27 NOTE — ED Provider Notes (Signed)
CSN: [id number]     Arrival date & time 02/27/14  1949 History   First MD Initiated Contact with Patient 02/27/14 2302     Chief Complaint  Patient presents with  . Emesis  . Wheezing     (Consider location/radiation/quality/duration/timing/severity/associated sxs/prior Treatment) The history is provided by a parent. No language interpreter was used.  Lori Miles is a 18 y/o F with PMHx of developmentally delayed, cerebral palsy, asthma, constipation on laxatives, autism, seizures, allergies presenting to the ED with abdominal pain, nausea, and emesis that has been ongoing since Friday. Mother reported that patient has been grabbing her groin - mother reported that when patient grabs her groin she normally has a UTI. Mother reported that patient has been grabbing others and stated that this is normally how she acts when she gets a UTI. Mother reported that patient continues to get UTI's - stated that she was recently seen on 02/17/2014 and diagnosed with UTI and placed on Keflex - mother reported that Keflex normally does not work for the patient. Stated that patient had at least 2-3 episodes of emesis Friday and at least 4-5 episodes today of a yellowish consistency - NB/NB. Mother reported that patient was seen on Wednesday by her PCP regarding asthma attack and was given prednisone. Stated that patient has been having increased wheezing. Reported that the patient was complaining of sore throat today. Denied fever, chills, melena, hematochezia, neck pain, neck stiffness, headache, changes to personality, chest pain, difficulty breathing. Mother reported patient to be at baseline.  PCP Dr. Claudie ReveringAvubere  Past Medical History  Diagnosis Date  . CP (cerebral palsy)     mom denies  . Asthma   . Mental deficiency     hx of aggressive behavior,impaired speech   . Constipation   . Allergy   . Autism     mom states no actual dx, but admits she has some symptoms  . Frequency-urgency syndrome       some incontinence ; wears pull-ups  . Seizure     most recent sz " at the end of summer"   Past Surgical History  Procedure Laterality Date  . Cystoscopy    . Cystoscopy with hydrodistension and biopsy  04/14/2012    Procedure: CYSTOSCOPY/BIOPSY/HYDRODISTENSION;  Surgeon: Lindaann SloughMarc-Henry Nesi, MD;  [facility];  Service: Urology;;  instillation of marcaine and pyridium   Family History  Problem Relation Age of Onset  . Seizures Mother   . Seizures Sister     1 sister has   History  Substance Use Topics  . Smoking status: Never Smoker   . Smokeless tobacco: Never Used  . Alcohol Use: No   OB History    No data available     Review of Systems  Constitutional: Negative for fever and chills.  Respiratory: Positive for wheezing. Negative for chest tightness and shortness of breath.   Cardiovascular: Negative for chest pain.  Gastrointestinal: Positive for nausea, vomiting and abdominal pain. Negative for diarrhea, constipation, blood in stool and anal bleeding.  Musculoskeletal: Negative for back pain and neck pain.      Allergies  Abilify; Seroquel; Amoxicillin-pot clavulanate; and Keppra  Home Medications   Prior to Admission medications   Medication Sig Start Date End Date Taking? Authorizing Provider  albuterol (PROVENTIL) (2.5 MG/3ML) 0.083% nebulizer solution Take 2.5 mg by nebulization every 6 (six) hours as needed for shortness of breath.    Yes Historical Provider, MD  budesonide (PULMICORT) 0.5 MG/2ML nebulizer  solution Take 0.25 mg by nebulization every morning. Per Dr. Willa Rough, Allergist   Yes Historical Provider, MD  cetirizine (ZYRTEC) 10 MG tablet Take 10 mg by mouth daily.  08/07/11  Yes Historical Provider, MD  cloNIDine (CATAPRES) 0.1 MG tablet Give 1 at bedtime Patient taking differently: Take 0.1 mg by mouth at bedtime as needed (sleep).  12/22/13  Yes Keturah Shavers, MD  Cranberry 500 MG CAPS Take 500 mg by mouth daily.   Yes Historical  Provider, MD  DEPAKOTE 500 MG DR tablet Take 1 tablet twice per day Patient taking differently: Take 500 mg by mouth 2 (two) times daily.  11/11/13  Yes Elveria Rising, NP  Levonorgestrel-Ethinyl Estradiol (SEASONIQUE) 0.15-0.03 &0.01 MG tablet Take 1 tablet by mouth daily. Take 1 active tablet daily, skip non active pills. 10/27/13  Yes Antionette Char, MD  Melatonin 3 MG TABS Take 3 mg by mouth at bedtime. For insomnia   Yes Historical Provider, MD  Meth-Hyo-M Bl-Na Phos-Ph Sal (URIBEL) 118 MG CAPS Take 1 capsule by mouth 2 (two) times daily.   Yes Historical Provider, MD  montelukast (SINGULAIR) 5 MG chewable tablet Chew 5 mg by mouth daily.   Yes Historical Provider, MD  Pediatric Multivit-Minerals-C (MULTIVITAMIN GUMMIES CHILDRENS PO) Take by mouth daily. Chew 2 by mouth every morning.   Yes Historical Provider, MD  polyethylene glycol powder (MIRALAX) powder Take 17 g by mouth 2 (two) times daily. 05/26/13 05/27/14 Yes Jon Gills, MD  senna (SENOKOT) 8.6 MG TABS tablet Take 1 tablet (8.6 mg total) by mouth daily. 09/08/13 09/09/14 Yes Jon Gills, MD  traZODone (DESYREL) 50 MG tablet Take 2 tabs by mouth at bedtime 09/15/13  Yes Elveria Rising, NP  trimethoprim (TRIMPEX) 100 MG tablet Take 100 mg by mouth daily.   Yes Historical Provider, MD  vitamin C (ASCORBIC ACID) 500 MG tablet Take 500 mg by mouth daily.   Yes Historical Provider, MD  cephALEXin (KEFLEX) 250 MG capsule Take 1 capsule (250 mg total) by mouth 3 (three) times daily. 02/13/14   Tyrone Nine, MD  fluconazole (DIFLUCAN) 150 MG tablet Take 1 tablet (150 mg total) by mouth once. Every other day. Patient not taking: Reported on 02/28/2014 01/12/14   Antionette Char, MD   BP 105/78 mmHg  Pulse 78  Temp(Src) 98.1 F (36.7 C) (Oral)  Resp 18  SpO2 99%  LMP  Physical Exam  Constitutional: She is oriented to person, place, and time. She appears well-developed and well-nourished. No distress.  HENT:  Head:  Normocephalic and atraumatic.  Mouth/Throat: Oropharynx is clear and moist. No oropharyngeal exudate.  Eyes: Conjunctivae and EOM are normal. Pupils are equal, round, and reactive to light. Right eye exhibits no discharge. Left eye exhibits no discharge.  Neck: Normal range of motion. Neck supple. No tracheal deviation present.  Negative neck stiffness Negative nuchal rigidity  Negative cervical lymphadenopathy  Negative meningeal signs  Cardiovascular: Normal rate, regular rhythm and normal heart sounds.  Exam reveals no friction rub.   No murmur heard. Pulses:      Radial pulses are 2+ on the right side, and 2+ on the left side.       Dorsalis pedis pulses are 2+ on the right side, and 2+ on the left side.  Cap refill < 3 seconds  Pulmonary/Chest: Effort normal and breath sounds normal. No respiratory distress. She has no wheezes. She has no rales. She exhibits no tenderness.  Respirations even and unlabored  Abdominal: Soft.  Bowel sounds are normal. She exhibits no distension. There is tenderness. There is no rebound and no guarding.  Overweight BS normoactive in all 4 quadrants Abdomen soft upon palpation  Voluntary guarding upon palpation to the suprapubic region of the abdomen   Musculoskeletal: Normal range of motion.  Lymphadenopathy:    She has no cervical adenopathy.  Neurological: She is alert and oriented to person, place, and time. No cranial nerve deficit. She exhibits normal muscle tone. Coordination normal.  Skin: Skin is warm and dry. No rash noted. She is not diaphoretic. No erythema.  Psychiatric: She has a normal mood and affect. Her behavior is normal. Thought content normal.  Nursing note and vitals reviewed.   ED Course  Procedures (including critical care time)  Results for orders placed or performed during the hospital encounter of 02/27/14  Rapid strep screen  Result Value Ref Range   Streptococcus, Group A Screen (Direct) NEGATIVE NEGATIVE  CBC with  Differential  Result Value Ref Range   WBC 12.6 (H) 4.0 - 10.5 K/uL   RBC 3.96 3.87 - 5.11 MIL/uL   Hemoglobin 11.7 (L) 12.0 - 15.0 g/dL   HCT 16.1 (L) 09.6 - 04.5 %   MCV 90.4 78.0 - 100.0 fL   MCH 29.5 26.0 - 34.0 pg   MCHC 32.7 30.0 - 36.0 g/dL   RDW 40.9 81.1 - 91.4 %   Platelets 270 150 - 400 K/uL   Neutrophils Relative % 59 43 - 77 %   Neutro Abs 7.3 1.7 - 7.7 K/uL   Lymphocytes Relative 25 12 - 46 %   Lymphs Abs 3.2 0.7 - 4.0 K/uL   Monocytes Relative 16 (H) 3 - 12 %   Monocytes Absolute 2.0 (H) 0.1 - 1.0 K/uL   Eosinophils Relative 0 0 - 5 %   Eosinophils Absolute 0.0 0.0 - 0.7 K/uL   Basophils Relative 0 0 - 1 %   Basophils Absolute 0.0 0.0 - 0.1 K/uL  Comprehensive metabolic panel  Result Value Ref Range   Sodium 146 137 - 147 mEq/L   Potassium 4.2 3.7 - 5.3 mEq/L   Chloride 104 96 - 112 mEq/L   CO2 21 19 - 32 mEq/L   Glucose, Bld 95 70 - 99 mg/dL   BUN 19 6 - 23 mg/dL   Creatinine, Ser 7.82 0.50 - 1.10 mg/dL   Calcium 8.8 8.4 - 95.6 mg/dL   Total Protein 6.4 6.0 - 8.3 g/dL   Albumin 3.0 (L) 3.5 - 5.2 g/dL   AST 34 0 - 37 U/L   ALT 33 0 - 35 U/L   Alkaline Phosphatase 49 39 - 117 U/L   Total Bilirubin <0.2 (L) 0.3 - 1.2 mg/dL   GFR calc non Af Amer >90 >90 mL/min   GFR calc Af Amer >90 >90 mL/min   Anion gap 21 (H) 5 - 15  Lipase, blood  Result Value Ref Range   Lipase 17 11 - 59 U/L  Urinalysis, Routine w reflex microscopic  Result Value Ref Range   Color, Urine GREEN (A) YELLOW   APPearance CLEAR CLEAR   Specific Gravity, Urine 1.039 (H) 1.005 - 1.030   pH 6.5 5.0 - 8.0   Glucose, UA NEGATIVE NEGATIVE mg/dL   Hgb urine dipstick NEGATIVE NEGATIVE   Bilirubin Urine NEGATIVE NEGATIVE   Ketones, ur NEGATIVE NEGATIVE mg/dL   Protein, ur NEGATIVE NEGATIVE mg/dL   Urobilinogen, UA 0.2 0.0 - 1.0 mg/dL   Nitrite NEGATIVE NEGATIVE  Leukocytes, UA NEGATIVE NEGATIVE  Pregnancy, urine  Result Value Ref Range   Preg Test, Ur NEGATIVE NEGATIVE    Labs  Review Labs Reviewed  CBC WITH DIFFERENTIAL - Abnormal; Notable for the following:    WBC 12.6 (*)    Hemoglobin 11.7 (*)    HCT 35.8 (*)    Monocytes Relative 16 (*)    Monocytes Absolute 2.0 (*)    All other components within normal limits  COMPREHENSIVE METABOLIC PANEL - Abnormal; Notable for the following:    Albumin 3.0 (*)    Total Bilirubin <0.2 (*)    Anion gap 21 (*)    All other components within normal limits  URINALYSIS, ROUTINE W REFLEX MICROSCOPIC - Abnormal; Notable for the following:    Color, Urine GREEN (*)    Specific Gravity, Urine 1.039 (*)    All other components within normal limits  RAPID STREP SCREEN  CULTURE, GROUP A STREP  LIPASE, BLOOD  PREGNANCY, URINE    Imaging Review Dg Chest 2 View  02/28/2014   CLINICAL DATA:  Asthma, scarring urinary tract infection, vomiting and wheezing.  EXAM: CHEST  2 VIEW  COMPARISON:  Chest radiograph February 14, 2012  FINDINGS: Similar mild bronchitic changes without pleural effusion or focal consolidation. No pneumothorax. Soft tissue planes and included osseous structures are nonsuspicious. Mild scoliosis.  IMPRESSION: Similar mild bronchitis or reactive airway disease without focal consolidation.   Electronically Signed   By: Awilda Metroourtnay  Bloomer   On: 02/28/2014 00:37     EKG Interpretation None       1:02 PM This provider spoke with Dr. Norlene Campbelltter, discussed case in great detail. As per physician recommended CT abdomen and pelvis with contrast to rule out acute abdominal processes.   MDM   Final diagnoses:  None   Medications  iohexol (OMNIPAQUE) 300 MG/ML solution 100 mL (not administered)  sodium chloride 0.9 % bolus 1,000 mL (0 mLs Intravenous Stopped 02/28/14 0140)  albuterol (PROVENTIL) (2.5 MG/3ML) 0.083% nebulizer solution 5 mg (5 mg Nebulization Given 02/28/14 0114)  iohexol (OMNIPAQUE) 300 MG/ML solution 50 mL (50 mLs Oral Contrast Given 02/28/14 0235)   Filed Vitals:   02/27/14 2319 02/27/14 2325  02/28/14 0106 02/28/14 0130  BP:  98/62  105/78  Pulse:  94  78  Temp:   98.9 F (37.2 C) 98.1 F (36.7 C)  TempSrc:   Rectal Oral  Resp:  18  18  SpO2: 97% 98%  99%   This provider reviewed the patient's chart. Patient was seen and assessed in the ED setting on 02/13/2014 regarding changes to behavior secondary to UTI. Patient was started on Keflex.  CBC noted mildly elevated leukocytosis of 12.6. Hemoglobin 11.7, hematocrit 35.8. CMP noted mildly elevated anion gap of 21.0 mEq per liter-this appears to be due to dehydration. Lipase negative elevation. Urine pregnancy negative. Urinalysis noted urine to be green in color-this was again noted on 02/13/2014 where patient was diagnosed with UTI. Negative findings nitrites, leukocytes-negative findings of infection. Rapid strep negative. Chest x-ray noted mild bronchitis or reactive airway disease without focal consolidation. Negative findings of UTI or pyelonephritis. Negative findings of pneumonia. Patient appears to have mild bronchitis on chest x-ray-patient given albuterol nebulizer treatment while in ED setting - negative findings of respiratory distress, abdominal retractions when breathing. Patient presenting to the ED with lower abdominal pain-on physical exam bowel sounds normoactive, discomfort upon palpation to suprapubic region. Mildly elevated leukocytosis identified. Discussed case in great detail with attending physician,  Dr. Jenean Lindau - recommended CT abdomen and pelvis with contrast to rule out possible intra-abdominal infection.  CT abdomen and pelvis with contrast ordered and exam pending. BMP ordered to re-check anion gap. Discussed case with Antony Madura, PA-C. Transfer of care to [facility], PA-C at change in shift.   Raymon Mutton, PA-C 02/28/14 9147  Olivia Mackie, MD 02/28/14 [phone]

## 2014-02-27 NOTE — ED Notes (Signed)
Pt arrived to the ED with a complaint of emesis.  Pt takes medications for a continual UTI.  Pt began emesis today around 1800 hrs.  Pt states that she had been taking antibiotics as well.  Pt also is complaining of wheezing, panic attack, and constipation.  Pt also is autistic and has seizures

## 2014-02-28 ENCOUNTER — Encounter (HOSPITAL_COMMUNITY): Payer: Self-pay

## 2014-02-28 ENCOUNTER — Emergency Department (HOSPITAL_COMMUNITY): Payer: Medicaid Other

## 2014-02-28 LAB — BASIC METABOLIC PANEL
Anion gap: 12 (ref 5–15)
BUN: 18 mg/dL (ref 6–23)
CO2: 24 mEq/L (ref 19–32)
Calcium: 8.6 mg/dL (ref 8.4–10.5)
Chloride: 101 mEq/L (ref 96–112)
Creatinine, Ser: 0.88 mg/dL (ref 0.50–1.10)
Glucose, Bld: 90 mg/dL (ref 70–99)
POTASSIUM: 3.9 meq/L (ref 3.7–5.3)
Sodium: 137 mEq/L (ref 137–147)

## 2014-02-28 LAB — RAPID STREP SCREEN (MED CTR MEBANE ONLY): Streptococcus, Group A Screen (Direct): NEGATIVE

## 2014-02-28 MED ORDER — AZITHROMYCIN 250 MG PO TABS: 250.0000 mg | ORAL_TABLET | Freq: Every day | ORAL | Status: DC

## 2014-02-28 MED ORDER — DICYCLOMINE HCL 20 MG PO TABS: 20.0000 mg | ORAL_TABLET | Freq: Three times a day (TID) | ORAL | Status: DC

## 2014-02-28 MED ORDER — IOHEXOL 300 MG/ML  SOLN
100.0000 mL | Freq: Once | INTRAMUSCULAR | Status: AC | PRN
  Administered 2014-02-28: 100 mL via INTRAVENOUS

## 2014-02-28 MED ORDER — IOHEXOL 300 MG/ML  SOLN
50.0000 mL | Freq: Once | INTRAMUSCULAR | Status: AC | PRN
  Administered 2014-02-28: 50 mL via ORAL

## 2014-02-28 MED ORDER — ALBUTEROL SULFATE (2.5 MG/3ML) 0.083% IN NEBU
5.0000 mg | INHALATION_SOLUTION | Freq: Once | RESPIRATORY_TRACT | Status: AC
  Administered 2014-02-28: 5 mg via RESPIRATORY_TRACT
  Filled 2014-02-28: qty 6

## 2014-02-28 NOTE — Discharge Instructions (Signed)

## 2014-02-28 NOTE — ED Notes (Signed)
Resting quietly with eye closed. Easily arousable. Verbally responsive. Resp even and unlabored. ABC's intact. NAD noted.  

## 2014-02-28 NOTE — ED Provider Notes (Signed)
[id number] - Patient care assumed from [facility], PA-C at shift change. Patient with CT abdomen pelvis pending at shift change. CT findings reviewed which show a 6cm right renal cyst. Do not believe that this is the cause of patient's symptoms today. Patient is followed by urology and is able to follow-up with them regarding monitoring of this cyst, per mother. Patient to be discharged with prescription for Bentyl for abdominal pain. Mother also requesting antibiotic prescription for persistent wheezing. Prescription for Z-Pak provided. Primary care follow-up advised and return precautions discussed. Mother agreeable to plan with no unaddressed concerns.  Filed Vitals:   02/27/14 2325 02/28/14 0106 02/28/14 0130 02/28/14 0410  BP: 98/62  105/78 108/67  Pulse: 94  78 96  Temp:  98.9 F (37.2 C) 98.1 F (36.7 C) 98.4 F (36.9 C)  TempSrc:  Rectal Oral Oral  Resp: 18  18 14   SpO2: 98%  99% 98%    Results for orders placed or performed during the hospital encounter of 02/27/14  Rapid strep screen  Result Value Ref Range   Streptococcus, Group A Screen (Direct) NEGATIVE NEGATIVE  CBC with Differential  Result Value Ref Range   WBC 12.6 (H) 4.0 - 10.5 K/uL   RBC 3.96 3.87 - 5.11 MIL/uL   Hemoglobin 11.7 (L) 12.0 - 15.0 g/dL   HCT 65.735.8 (L) 84.636.0 - 96.246.0 %   MCV 90.4 78.0 - 100.0 fL   MCH 29.5 26.0 - 34.0 pg   MCHC 32.7 30.0 - 36.0 g/dL   RDW 95.213.3 84.111.5 - 32.415.5 %   Platelets 270 150 - 400 K/uL   Neutrophils Relative % 59 43 - 77 %   Neutro Abs 7.3 1.7 - 7.7 K/uL   Lymphocytes Relative 25 12 - 46 %   Lymphs Abs 3.2 0.7 - 4.0 K/uL   Monocytes Relative 16 (H) 3 - 12 %   Monocytes Absolute 2.0 (H) 0.1 - 1.0 K/uL   Eosinophils Relative 0 0 - 5 %   Eosinophils Absolute 0.0 0.0 - 0.7 K/uL   Basophils Relative 0 0 - 1 %   Basophils Absolute 0.0 0.0 - 0.1 K/uL  Comprehensive metabolic panel  Result Value Ref Range   Sodium 146 137 - 147 mEq/L   Potassium 4.2 3.7 - 5.3 mEq/L   Chloride 104 96 -  112 mEq/L   CO2 21 19 - 32 mEq/L   Glucose, Bld 95 70 - 99 mg/dL   BUN 19 6 - 23 mg/dL   Creatinine, Ser 4.010.80 0.50 - 1.10 mg/dL   Calcium 8.8 8.4 - 02.710.5 mg/dL   Total Protein 6.4 6.0 - 8.3 g/dL   Albumin 3.0 (L) 3.5 - 5.2 g/dL   AST 34 0 - 37 U/L   ALT 33 0 - 35 U/L   Alkaline Phosphatase 49 39 - 117 U/L   Total Bilirubin <0.2 (L) 0.3 - 1.2 mg/dL   GFR calc non Af Amer >90 >90 mL/min   GFR calc Af Amer >90 >90 mL/min   Anion gap 21 (H) 5 - 15  Lipase, blood  Result Value Ref Range   Lipase 17 11 - 59 U/L  Urinalysis, Routine w reflex microscopic  Result Value Ref Range   Color, Urine GREEN (A) YELLOW   APPearance CLEAR CLEAR   Specific Gravity, Urine 1.039 (H) 1.005 - 1.030   pH 6.5 5.0 - 8.0   Glucose, UA NEGATIVE NEGATIVE mg/dL   Hgb urine dipstick NEGATIVE NEGATIVE   Bilirubin Urine  NEGATIVE NEGATIVE   Ketones, ur NEGATIVE NEGATIVE mg/dL   Protein, ur NEGATIVE NEGATIVE mg/dL   Urobilinogen, UA 0.2 0.0 - 1.0 mg/dL   Nitrite NEGATIVE NEGATIVE   Leukocytes, UA NEGATIVE NEGATIVE  Pregnancy, urine  Result Value Ref Range   Preg Test, Ur NEGATIVE NEGATIVE   Dg Chest 2 View  02/28/2014   CLINICAL DATA:  Asthma, scarring urinary tract infection, vomiting and wheezing.  EXAM: CHEST  2 VIEW  COMPARISON:  Chest radiograph February 14, 2012  FINDINGS: Similar mild bronchitic changes without pleural effusion or focal consolidation. No pneumothorax. Soft tissue planes and included osseous structures are nonsuspicious. Mild scoliosis.  IMPRESSION: Similar mild bronchitis or reactive airway disease without focal consolidation.   Electronically Signed   By: Awilda Metroourtnay  Bloomer   On: 02/28/2014 00:37   Ct Abdomen Pelvis W Contrast  02/28/2014   CLINICAL DATA:  Emesis and wheezing. Nausea, vomiting common constipation  EXAM: CT ABDOMEN AND PELVIS WITH CONTRAST  TECHNIQUE: Multidetector CT imaging of the abdomen and pelvis was performed using the standard protocol following bolus  administration of intravenous contrast.  CONTRAST:  100mL OMNIPAQUE IOHEXOL 300 MG/ML  SOLN  COMPARISON:  None.  FINDINGS: BODY WALL: Unremarkable.  LOWER CHEST: Unremarkable.  ABDOMEN/PELVIS:  Liver: No focal abnormality.  Biliary: No evidence of biliary obstruction or stone.  Pancreas: Unremarkable.  Spleen: Unremarkable.  Adrenals: Unremarkable.  Kidneys and ureters: 6 cm right renal cyst, enlarged since 2013 renal ultrasound. No complexity to suggest recent hemorrhage or infection as a cause of the patient's symptoms. No hydronephrosis or urolithiasis.  Bladder: Limited evaluation given decompressed state.  Reproductive: Low-density appearance of the lower uterus is likely normal hypo enhancement of the lower uterine segment rather than a cervical mass. It is reassuring that the endometrial cavity is not distended superiorly and that the interface between enhancing and nonenhancing uterus is discrete. The ovaries are negative.  Bowel: No obstruction. Normal appendix.  Retroperitoneum: No mass or adenopathy.  Peritoneum: No ascites or pneumoperitoneum.  Vascular: No acute abnormality.  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  IMPRESSION: 1. No acute intra-abdominal findings. 2. 6 cm right renal cyst.   Electronically Signed   By: Tiburcio PeaJonathan  Watts M.D.   On: 02/28/2014 04:01      Antony MaduraKelly Kingdom Vanzanten, PA-C 02/28/14 [id number]  Olivia Mackielga M Otter, MD 02/28/14 [phone]

## 2014-02-28 NOTE — ED Notes (Signed)
Awake. Verbally responsive. A/O x4. Resp even and unlabored. No audible adventitious breath sounds noted. Pt has sneezing of clear secretions. ABC's intact.

## 2014-02-28 NOTE — ED Notes (Signed)
Awake. Verbally responsive. A/O x4. Resp even and unlabored. No audible adventitious breath sounds noted. ABC's intact. Family at bedside. 

## 2014-02-28 NOTE — ED Notes (Signed)
Patient to X-ray and returned to room without distress noted.

## 2014-02-28 NOTE — ED Notes (Signed)
Resting quietly with eye closed. Easily arousable. Verbally responsive. Resp even and unlabored. ABC's intact. IV completely infused NS without difficulty. NAD noted.

## 2014-02-28 NOTE — ED Notes (Signed)
Awake. Verbally responsive. A/O x4. Resp even and unlabored. No audible adventitious breath sounds noted. ABC's intact. NAD noted. 

## 2014-02-28 NOTE — ED Notes (Signed)
Patient transported to CT 

## 2014-03-02 LAB — CULTURE, GROUP A STREP

## 2014-03-14 ENCOUNTER — Other Ambulatory Visit: Payer: Self-pay

## 2014-03-14 ENCOUNTER — Encounter: Payer: Self-pay | Admitting: *Deleted

## 2014-03-14 DIAGNOSIS — G4700 Insomnia, unspecified: Secondary | ICD-10-CM

## 2014-03-14 DIAGNOSIS — N3944 Nocturnal enuresis: Secondary | ICD-10-CM

## 2014-03-14 MED ORDER — TRAZODONE HCL 50 MG PO TABS: ORAL_TABLET | ORAL | Status: DC

## 2014-03-14 MED ORDER — CLONIDINE HCL 0.1 MG PO TABS: ORAL_TABLET | ORAL | Status: DC

## 2014-03-15 ENCOUNTER — Encounter: Payer: Self-pay | Admitting: Obstetrics & Gynecology

## 2014-03-23 ENCOUNTER — Other Ambulatory Visit (HOSPITAL_COMMUNITY): Payer: Self-pay | Admitting: Urology

## 2014-03-23 DIAGNOSIS — N281 Cyst of kidney, acquired: Secondary | ICD-10-CM

## 2014-03-30 ENCOUNTER — Other Ambulatory Visit: Payer: Self-pay | Admitting: Radiology

## 2014-04-01 ENCOUNTER — Ambulatory Visit (HOSPITAL_COMMUNITY)
Admission: RE | Admit: 2014-04-01 | Discharge: 2014-04-01 | Disposition: A | Discharge status: Home / Self Care | Payer: Medicaid Other | Source: Ambulatory Visit | Attending: Urology | Admitting: Urology

## 2014-04-01 ENCOUNTER — Encounter (HOSPITAL_COMMUNITY): Payer: Self-pay

## 2014-04-01 DIAGNOSIS — K5900 Constipation, unspecified: Secondary | ICD-10-CM | POA: Insufficient documentation

## 2014-04-01 DIAGNOSIS — Z79899 Other long term (current) drug therapy: Secondary | ICD-10-CM | POA: Diagnosis not present

## 2014-04-01 DIAGNOSIS — N281 Cyst of kidney, acquired: Secondary | ICD-10-CM | POA: Diagnosis present

## 2014-04-01 DIAGNOSIS — F840 Autistic disorder: Secondary | ICD-10-CM | POA: Insufficient documentation

## 2014-04-01 DIAGNOSIS — J45909 Unspecified asthma, uncomplicated: Secondary | ICD-10-CM | POA: Diagnosis not present

## 2014-04-01 DIAGNOSIS — G809 Cerebral palsy, unspecified: Secondary | ICD-10-CM | POA: Diagnosis not present

## 2014-04-01 LAB — CBC WITH DIFFERENTIAL/PLATELET
BASOS PCT: 0 % (ref 0–1)
Basophils Absolute: 0 10*3/uL (ref 0.0–0.1)
EOS PCT: 0 % (ref 0–5)
Eosinophils Absolute: 0 10*3/uL (ref 0.0–0.7)
HCT: 35.1 % — ABNORMAL LOW (ref 36.0–46.0)
Hemoglobin: 11.6 g/dL — ABNORMAL LOW (ref 12.0–15.0)
Lymphocytes Relative: 36 % (ref 12–46)
Lymphs Abs: 4 10*3/uL (ref 0.7–4.0)
MCH: 30.3 pg (ref 26.0–34.0)
MCHC: 33 g/dL (ref 30.0–36.0)
MCV: 91.6 fL (ref 78.0–100.0)
MONO ABS: 1.2 10*3/uL — AB (ref 0.1–1.0)
Monocytes Relative: 11 % (ref 3–12)
NEUTROS ABS: 5.7 10*3/uL (ref 1.7–7.7)
NEUTROS PCT: 53 % (ref 43–77)
Platelets: 308 10*3/uL (ref 150–400)
RBC: 3.83 MIL/uL — ABNORMAL LOW (ref 3.87–5.11)
RDW: 13.8 % (ref 11.5–15.5)
WBC: 10.9 10*3/uL — ABNORMAL HIGH (ref 4.0–10.5)

## 2014-04-01 LAB — APTT: APTT: 25 s (ref 24–37)

## 2014-04-01 LAB — BASIC METABOLIC PANEL
ANION GAP: 8 (ref 5–15)
BUN: 14 mg/dL (ref 6–23)
CO2: 24 mmol/L (ref 19–32)
Calcium: 9 mg/dL (ref 8.4–10.5)
Chloride: 105 mEq/L (ref 96–112)
Creatinine, Ser: 0.75 mg/dL (ref 0.50–1.10)
GFR calc non Af Amer: >90 mL/min (ref >90)
GLUCOSE: 77 mg/dL (ref 70–99)
Potassium: 4.1 mmol/L (ref 3.5–5.1)
Sodium: 137 mmol/L (ref 135–145)

## 2014-04-01 LAB — PROTIME-INR
INR: 1.02 (ref 0.00–1.49)
PROTHROMBIN TIME: 13.5 s (ref 11.6–15.2)

## 2014-04-01 MED ORDER — NALOXONE HCL 0.4 MG/ML IJ SOLN
INTRAMUSCULAR | Status: AC
  Filled 2014-04-01: qty 1

## 2014-04-01 MED ORDER — MIDAZOLAM HCL 2 MG/2ML IJ SOLN
INTRAMUSCULAR | Status: AC | PRN
  Administered 2014-04-01 (×2): 1 mg via INTRAVENOUS

## 2014-04-01 MED ORDER — FENTANYL CITRATE 0.05 MG/ML IJ SOLN
INTRAMUSCULAR | Status: AC
  Filled 2014-04-01: qty 4

## 2014-04-01 MED ORDER — FLUMAZENIL 0.5 MG/5ML IV SOLN
INTRAVENOUS | Status: AC
  Filled 2014-04-01: qty 5

## 2014-04-01 MED ORDER — MIDAZOLAM HCL 2 MG/2ML IJ SOLN
INTRAMUSCULAR | Status: AC
  Filled 2014-04-01: qty 6

## 2014-04-01 MED ORDER — FENTANYL CITRATE 0.05 MG/ML IJ SOLN
INTRAMUSCULAR | Status: AC | PRN
  Administered 2014-04-01 (×2): 50 ug via INTRAVENOUS

## 2014-04-01 MED ORDER — SODIUM CHLORIDE 0.9 % IV SOLN
INTRAVENOUS | Status: DC
  Administered 2014-04-01: 11:00:00 via INTRAVENOUS

## 2014-04-01 NOTE — Discharge Instructions (Signed)
Fine Needle Aspiration °Fine needle aspiration is a procedure used to remove a piece of tissue. The tissue may be removed from a swelling or abnormal growth (tumor). It is also used to confirm a cyst. A cyst is a fluid filled sac. The procedure may be done by your caregiver or a specialist. °LET YOUR CAREGIVER KNOW ABOUT:  °· Any medications you take, especially blood thinners like aspirin. °· Any problems you may have had with similar procedures in the past. °RISKS AND COMPLICATIONS °This is a safe procedure. There is a very small risk of infection and or bleeding. Other complications can occur if the aspiration site is deep in the body. Your caregiver or specialist will explain this to you.  °BEFORE THE PROCEDURE  °· No special preparation is needed in most cases. Your caregiver will let you know if there are special requirements, such as an empty stomach. °· Be sure to ask your caregiver any questions before the procedure starts. °PROCEDURE  °· This procedure is done under local or no anesthetic. °· The skin is cleaned carefully. °· A thin needle is directed into a lump. The needle is directed in several different directions into the lump while suction is applied to the needle. When samples are removed, the needle is withdrawn. °· The contents obtained are placed on a slide. They are then fixed, stained and examined under the microscope. The slide is examined by a specialist in the examination of tissue (pathologist). °· A diagnosis can then be made. The pathologist will decide if the specimen is cancerous (malignant) or not cancerous (benign). If fluid is taken from a cyst, cells from the fluid can be examined. If no material is obtained from a fine needle aspiration, the sample may not rule out a problem. Sometimes the procedure is done again. The pathologist may need several days before a result is available. °AFTER THE PROCEDURE  °· There are usually no limits on diet or activity after a fine needle  aspiration. °· Your caregiver may give you instructions regarding the aspiration site. This will include information about keeping the site clean and dry. Follow these instructions carefully. °· Call for your test results as instructed by your caregiver. Remember, it is your responsibility to get the results of your testing. Do not think everything is fine if you have not heard from your caregiver. °· Keep a close watch on the aspiration site. Report any redness, swelling or drainage. °· You should not have much pain at the aspiration site. °· Take any medications as told by your caregiver. °SEEK MEDICAL CARE IF:  °· You have pain or drainage at the aspiration site that does not go away. °· Swelling at the aspiration site does not gradually go away. °SEEK IMMEDIATE MEDICAL CARE IF:  °· You develop a fever a day or two after the aspiration. °· You develop severe pain at the aspiration site. °· You develop a warm, tender swelling at the aspiration site. °Document Released: 03/01/2000 Document Revised: 05/27/2011 Document Reviewed: 11/13/2007 °ExitCare® Patient Information ©2015 [employer]. This information is not intended to replace advice given to you by your health care provider. Make sure you discuss any questions you have with your health care provider. °Conscious Sedation °Sedation is the use of medicines to promote relaxation and relieve discomfort and anxiety. Conscious sedation is a type of sedation. Under conscious sedation you are less alert than normal but are still able to respond to instructions or stimulation. Conscious sedation is used during   short medical and dental procedures. It is milder than deep sedation or general anesthesia and allows you to return to your regular activities sooner.  °LET YOUR HEALTH CARE PROVIDER KNOW ABOUT:  °· Any allergies you have. °· All medicines you are taking, including vitamins, herbs, eye drops, creams, and over-the-counter medicines. °· Use of steroids (by mouth  or creams). °· Previous problems you or members of your family have had with the use of anesthetics. °· Any blood disorders you have. °· Previous surgeries you have had. °· Medical conditions you have. °· Possibility of pregnancy, if this applies. °· Use of cigarettes, alcohol, or illegal drugs. °RISKS AND COMPLICATIONS °Generally, this is a safe procedure. However, as with any procedure, problems can occur. Possible problems include: °· Oversedation. °· Trouble breathing on your own. You may need to have a breathing tube until you are awake and breathing on your own. °· Allergic reaction to any of the medicines used for the procedure. °BEFORE THE PROCEDURE °· You may have blood tests done. These tests can help show how well your kidneys and liver are working. They can also show how well your blood clots. °· A physical exam will be done.   °· Only take medicines as directed by your health care provider. You may need to stop taking medicines (such as blood thinners, aspirin, or nonsteroidal anti-inflammatory drugs) before the procedure.   °· Do not eat or drink at least 6 hours before the procedure or as directed by your health care provider. °· Arrange for a responsible adult, family member, or friend to take you home after the procedure. He or she should stay with you for at least 24 hours after the procedure, until the medicine has worn off. °PROCEDURE  °· An intravenous (IV) catheter will be inserted into one of your veins. Medicine will be able to flow directly into your body through this catheter. You may be given medicine through this tube to help prevent pain and help you relax. °· The medical or dental procedure will be done. °AFTER THE PROCEDURE °· You will stay in a recovery area until the medicine has worn off. Your blood pressure and pulse will be checked.   °·  Depending on the procedure you had, you may be allowed to go home when you can tolerate liquids and your pain is under control. °Document  Released: 11/27/2000 Document Revised: 03/09/2013 Document Reviewed: 11/09/2012 °ExitCare® Patient Information ©2015 [employer]. This information is not intended to replace advice given to you by your health care provider. Make sure you discuss any questions you have with your health care provider. ° °

## 2014-04-01 NOTE — Procedures (Signed)
Interventional Radiology Procedure Note  Procedure: Aspiration of RIGHT renal cyst with US guidance Complications: None Recommendations: - Bedrest x 1 hr  Signed,  Sterling BigHeath K. McCullough, MD

## 2014-04-01 NOTE — H&P (Signed)
Chief Complaint: Abdominal pain.  Referring Physician(s): Nesi,Marc H  History of Present Illness: Lori Miles is a 19 y.o. female seen by Dr. Brunilda Payor on 03/22/14 for symptoms of abdominal pain and increasing size of right renal cyst. The patient mother provides most of the history today as she states the patient is autistic and unable to accurately provide history. The patient has recently been complaining of right sided abdominal pain and her mother was concerned about her renal cyst that she was told about back in 2013. A CT performed on 02/28/14 demonstrated increase in size of the cyst now measuring 6 cm. She is here today for a renal cyst aspiration.   Past Medical History  Diagnosis Date  . CP (cerebral palsy)     mom denies  . Asthma   . Mental deficiency     hx of aggressive behavior,impaired speech   . Constipation   . Allergy   . Autism     mom states no actual dx, but admits she has some symptoms  . Frequency-urgency syndrome      some incontinence ; wears pull-ups  . Seizure     most recent sz " at the end of summer"    Past Surgical History  Procedure Laterality Date  . Cystoscopy    . Cystoscopy with hydrodistension and biopsy  04/14/2012    Procedure: CYSTOSCOPY/BIOPSY/HYDRODISTENSION;  Surgeon: Lindaann Slough, MD;  Location: [facility] [place];  Service: Urology;;  instillation of marcaine and pyridium    Allergies: Abilify; Seroquel; Amoxicillin-pot clavulanate; and Keppra  Medications: Prior to Admission medications   Medication Sig Start Date End Date Taking? Authorizing Provider  albuterol (PROVENTIL) (2.5 MG/3ML) 0.083% nebulizer solution Take 2.5 mg by nebulization every 6 (six) hours as needed for wheezing or shortness of breath (wheezing).    Yes Historical Provider, MD  budesonide (PULMICORT) 0.5 MG/2ML nebulizer solution Take 0.25 mg by nebulization 2 (two) times daily. Per Dr. Willa Rough, Allergist   Yes Historical Provider, MD  cetirizine  (ZYRTEC) 10 MG tablet Take 10 mg by mouth daily.  08/07/11  Yes Historical Provider, MD  cloNIDine (CATAPRES) 0.1 MG tablet Take 1 tab by mouth at bedtime. 03/14/14  Yes Deetta Perla, MD  Cranberry 500 MG CAPS Take 500 mg by mouth daily.   Yes Historical Provider, MD  DEPAKOTE 500 MG DR tablet Take 1 tablet twice per day Patient taking differently: Take 500 mg by mouth 2 (two) times daily.  11/11/13  Yes Elveria Rising, NP  dicyclomine (BENTYL) 20 MG tablet Take 1 tablet (20 mg total) by mouth 4 (four) times daily -  before meals and at bedtime. Patient taking differently: Take 20 mg by mouth 3 (three) times daily before meals.  02/28/14  Yes Antony Madura, PA-C  Levonorgestrel-Ethinyl Estradiol (SEASONIQUE) 0.15-0.03 &0.01 MG tablet Take 1 tablet by mouth daily. Take 1 active tablet daily, skip non active pills. 10/27/13  Yes Antionette Char, MD  Melatonin 3 MG TABS Take 3 mg by mouth at bedtime. For insomnia   Yes Historical Provider, MD  montelukast (SINGULAIR) 5 MG chewable tablet Chew 5 mg by mouth daily.   Yes Historical Provider, MD  Multiple Vitamin (MULTIVITAMIN WITH MINERALS) TABS tablet Take 1 tablet by mouth daily.   Yes Historical Provider, MD  Olopatadine HCl 0.2 % SOLN Place 1 drop into both eyes daily.   Yes Historical Provider, MD  polyethylene glycol powder (MIRALAX) powder Take 17 g by mouth 2 (two) times daily.  05/26/13 05/27/14 Yes Jon GillsJoseph H Clark, MD  senna (SENOKOT) 8.6 MG TABS tablet Take 1 tablet (8.6 mg total) by mouth daily. 09/08/13 09/09/14 Yes Jon GillsJoseph H Clark, MD  traZODone (DESYREL) 50 MG tablet Take 2 tabs by mouth at bedtime 03/14/14  Yes Deetta PerlaWilliam H Hickling, MD  trimethoprim (TRIMPEX) 100 MG tablet Take 100 mg by mouth every morning.    Yes Historical Provider, MD  vitamin C (ASCORBIC ACID) 500 MG tablet Take 500 mg by mouth daily.   Yes Historical Provider, MD  azithromycin (ZITHROMAX) 250 MG tablet Take 1 tablet (250 mg total) by mouth daily. Take first 2 tablets  together, then 1 every day until finished. Patient not taking: Reported on 03/29/2014 02/28/14   Antony MaduraKelly Humes, PA-C  cephALEXin (KEFLEX) 250 MG capsule Take 1 capsule (250 mg total) by mouth 3 (three) times daily. Patient not taking: Reported on 03/29/2014 02/13/14   Tyrone Nineyan B Grunz, MD  fluconazole (DIFLUCAN) 150 MG tablet Take 1 tablet (150 mg total) by mouth once. Every other day. Patient not taking: Reported on 02/28/2014 01/12/14   Antionette CharLisa Jackson-Moore, MD    Family History  Problem Relation Age of Onset  . Seizures Mother   . Seizures Sister     1 sister has    History   Social History  . Marital Status: Single    Spouse Name: N/A    Number of Children: N/A  . Years of Education: N/A   Social History Main Topics  . Smoking status: Never Smoker   . Smokeless tobacco: Never Used  . Alcohol Use: No  . Drug Use: No  . Sexual Activity: No   Other Topics Concern  . None   Social History Narrative   Lives with mom Research scientist (physical sciences)([name]) and half-sister [name](Nancy Porras) who is also mentally impaired.  Has CAP assistance, urinary incontinence, needs help with feeding,dressing, toileting   Goes to [employer].  In the 9th grade.    Review of Systems: A 12 point ROS discussed and pertinent positives are indicated in the HPI above.  All other systems are negative.  Review of Systems  Vital Signs: BP 117/80 mmHg  Pulse 110  Temp(Src) 99 F (37.2 C) (Oral)  Resp 14  Ht 4\' 9"  (1.448 m)  Wt 156 lb (70.761 kg)  BMI 33.75 kg/m2  SpO2 100%  LMP  (LMP Unknown)  Physical Exam  Constitutional: She is oriented to person, place, and time. No distress.  HENT:  Head: Normocephalic and atraumatic.  Neck: No tracheal deviation present.  Cardiovascular: Exam reveals no gallop and no friction rub.   No murmur heard. Tachycardic  Pulmonary/Chest: Effort normal and breath sounds normal. No respiratory distress. She has no wheezes. She has no rales.  Abdominal: Soft. Bowel sounds  are normal. She exhibits no distension. There is no tenderness.  Neurological: She is alert and oriented to person, place, and time.  Skin: She is not diaphoretic.    Imaging: No results found.  Labs:  CBC:  Recent Labs  02/27/14 2043 04/01/14 1120  WBC 12.6* 10.9*  HGB 11.7* 11.6*  HCT 35.8* 35.1*  PLT 270 308    COAGS:  Recent Labs  04/01/14 1120  INR 1.02  APTT 25    BMP:  Recent Labs  02/27/14 2043 02/28/14 0402  NA 146 137  K 4.2 3.9  CL 104 101  CO2 21 24  GLUCOSE 95 90  BUN 19 18  CALCIUM 8.8 8.6  CREATININE 0.80 0.88  GFRNONAA >90 >90  GFRAA >90 >90    LIVER FUNCTION TESTS:  Recent Labs  02/27/14 2043  BILITOT <0.2*  AST 34  ALT 33  ALKPHOS 49  PROT 6.4  ALBUMIN 3.0*   Assessment and Plan: Vague abdominal pain Right renal cyst increased in size since 2013 CT, cyst measured 6 cm on CT 02/28/14 Seen in the office by Dr. Brunilda Payor on 03/22/14, UA done at that time with negative nitrite and small Leukocyte esterase, wbc 11-20 on Trimethoprim daily.  Scheduled today for right renal cyst aspiration with moderate sedation Patient has been NPO, no blood thinners, labs reviewed Risks and Benefits discussed with the patient's mother today, she is aware this may or may not be the etiology of the patient's vague pain and that the cyst may reoccur. All questions were answered, patient's mother is agreeable to proceed. Consent signed and in chart.   SignedBerneta Levins 04/01/2014, 12:02 PM

## 2014-05-10 ENCOUNTER — Other Ambulatory Visit: Payer: Self-pay

## 2014-05-10 DIAGNOSIS — G40309 Generalized idiopathic epilepsy and epileptic syndromes, not intractable, without status epilepticus: Secondary | ICD-10-CM

## 2014-05-10 DIAGNOSIS — G40209 Localization-related (focal) (partial) symptomatic epilepsy and epileptic syndromes with complex partial seizures, not intractable, without status epilepticus: Secondary | ICD-10-CM

## 2014-05-10 MED ORDER — DEPAKOTE 500 MG PO TBEC: DELAYED_RELEASE_TABLET | ORAL | Status: DC

## 2014-05-18 ENCOUNTER — Ambulatory Visit (INDEPENDENT_AMBULATORY_CARE_PROVIDER_SITE_OTHER): Payer: Medicaid Other | Admitting: Certified Nurse Midwife

## 2014-05-18 ENCOUNTER — Encounter: Payer: Self-pay | Admitting: Certified Nurse Midwife

## 2014-05-18 VITALS — BP 115/74 | HR 110 | Temp 98.5°F | Wt 176.0 lb

## 2014-05-18 DIAGNOSIS — Z3041 Encounter for surveillance of contraceptive pills: Secondary | ICD-10-CM

## 2014-05-18 DIAGNOSIS — B373 Candidiasis of vulva and vagina: Secondary | ICD-10-CM | POA: Diagnosis not present

## 2014-05-18 DIAGNOSIS — B3731 Acute candidiasis of vulva and vagina: Secondary | ICD-10-CM

## 2014-05-18 MED ORDER — FLUCONAZOLE 100 MG PO TABS: 100.0000 mg | ORAL_TABLET | Freq: Once | ORAL | Status: DC

## 2014-05-19 ENCOUNTER — Other Ambulatory Visit: Payer: Self-pay | Admitting: *Deleted

## 2014-05-19 DIAGNOSIS — B3731 Acute candidiasis of vulva and vagina: Secondary | ICD-10-CM

## 2014-05-19 DIAGNOSIS — B373 Candidiasis of vulva and vagina: Secondary | ICD-10-CM

## 2014-05-19 MED ORDER — FLUCONAZOLE 100 MG PO TABS: 100.0000 mg | ORAL_TABLET | Freq: Once | ORAL | Status: DC

## 2014-05-19 NOTE — Progress Notes (Signed)
Patient ID: Lori Miles, female   DOB: 1995-10-09, 19 y.o.   MRN: [id number]   Chief Complaint  Patient presents with  . Follow-up    HPI Lori Miles is a 19 y.o. female.  With hx of severe Autism, seizures, and mental impairments.   Here for annual exam/check up with her mother and sister.  Mom denies any problems with her periods.  On seasonique doing well.  C/O vaginal itching/white discharge, has frequent candidiasis infections according to her mother.   HPI  Past Medical History  Diagnosis Date  . CP (cerebral palsy)     mom denies  . Asthma   . Mental deficiency     hx of aggressive behavior,impaired speech   . Constipation   . Allergy   . Autism     mom states no actual dx, but admits she has some symptoms  . Frequency-urgency syndrome      some incontinence ; wears pull-ups  . Seizure     most recent sz " at the end of summer"    Past Surgical History  Procedure Laterality Date  . Cystoscopy    . Cystoscopy with hydrodistension and biopsy  04/14/2012    Procedure: CYSTOSCOPY/BIOPSY/HYDRODISTENSION;  Surgeon: Lindaann Slough, MD;  Location: [facility] [place];  Service: Urology;;  instillation of marcaine and pyridium    Family History  Problem Relation Age of Onset  . Seizures Mother   . Seizures Sister     1 sister has    Social History History  Substance Use Topics  . Smoking status: Never Smoker   . Smokeless tobacco: Never Used  . Alcohol Use: No    Allergies  Allergen Reactions  . Abilify [Aripiprazole] Swelling and Palpitations  . Seroquel [Quetiapine Fumarate] Palpitations  . Amoxicillin-Pot Clavulanate Hives  . Keppra [Levetiracetam] Other (See Comments)    Aggressive behavior     Current Outpatient Prescriptions  Medication Sig Dispense Refill  . albuterol (PROVENTIL) (2.5 MG/3ML) 0.083% nebulizer solution Take 2.5 mg by nebulization every 6 (six) hours as needed for wheezing or shortness of breath (wheezing).     .  budesonide (PULMICORT) 0.5 MG/2ML nebulizer solution Take 0.25 mg by nebulization 2 (two) times daily. Per Dr. Willa Rough, Allergist    . cetirizine (ZYRTEC) 10 MG tablet Take 10 mg by mouth daily.     . cloNIDine (CATAPRES) 0.1 MG tablet Take 1 tab by mouth at bedtime. 31 tablet 5  . Cranberry 500 MG CAPS Take 500 mg by mouth daily.    Marland Kitchen DEPAKOTE 500 MG DR tablet Take 1 tablet twice per day 60 tablet 4  . dicyclomine (BENTYL) 20 MG tablet Take 1 tablet (20 mg total) by mouth 4 (four) times daily -  before meals and at bedtime. (Patient taking differently: Take 20 mg by mouth 3 (three) times daily before meals. ) 30 tablet 0  . Levonorgestrel-Ethinyl Estradiol (SEASONIQUE) 0.15-0.03 &0.01 MG tablet Take 1 tablet by mouth daily. Take 1 active tablet daily, skip non active pills. 1 Package 11  . Melatonin 3 MG TABS Take 3 mg by mouth at bedtime. For insomnia    . montelukast (SINGULAIR) 5 MG chewable tablet Chew 5 mg by mouth daily.    . Multiple Vitamin (MULTIVITAMIN WITH MINERALS) TABS tablet Take 1 tablet by mouth daily.    . polyethylene glycol powder (MIRALAX) powder Take 17 g by mouth 2 (two) times daily. 850 g 5  . senna (SENOKOT) 8.6 MG TABS tablet Take 1  tablet (8.6 mg total) by mouth daily. 100 tablet 0  . traZODone (DESYREL) 50 MG tablet Take 2 tabs by mouth at bedtime 62 tablet 5  . trimethoprim (TRIMPEX) 100 MG tablet Take 100 mg by mouth every morning.     . vitamin C (ASCORBIC ACID) 500 MG tablet Take 500 mg by mouth daily.    . fluconazole (DIFLUCAN) 100 MG tablet Take 1 tablet (100 mg total) by mouth once. Repeat in 48-72 hours. 2 tablet 4  . Olopatadine HCl 0.2 % SOLN Place 1 drop into both eyes daily.     No current facility-administered medications for this visit.    Review of Systems Review of Systems Constitutional: negative for fatigue and weight loss Respiratory: negative for cough and wheezing Cardiovascular: negative for chest pain, fatigue and  palpitations Gastrointestinal: negative for abdominal pain and change in bowel habits Genitourinary:negative Integument/breast: negative for nipple discharge Musculoskeletal:negative for myalgias Neurological: negative for gait problems and tremors Behavioral/Psych: negative for abusive relationship, depression Endocrine: negative for temperature intolerance     Blood pressure 115/74, pulse 110, temperature 98.5 F (36.9 C), weight 79.833 kg (176 lb).  Physical Exam Physical Exam General:   alert  Skin:   no rash or abnormalities  Lungs:   clear to auscultation bilaterally  Heart:   regular rate and rhythm, S1, S2 normal, no murmur, click, rub or gallop  Abdomen:  normal findings: no organomegaly, soft, non-tender and no hernia  Pelvis:  deferred    95% of 15 min visit spent on counseling and coordination of care.   Data Reviewed Previous medical hx, labs  Assessment     Well woman exam Probable vaginal candidiasis     Plan    No orders of the defined types were placed in this encounter.   Meds ordered this encounter  Medications  . DISCONTD: fluconazole (DIFLUCAN) 100 MG tablet    Sig: Take 1 tablet (100 mg total) by mouth once. Repeat in 48-72 hours.    Dispense:  2 tablet    Refill:  4    Follow up as needed and yearly.

## 2014-05-31 ENCOUNTER — Encounter (HOSPITAL_COMMUNITY): Payer: Self-pay | Admitting: Emergency Medicine

## 2014-05-31 ENCOUNTER — Emergency Department (HOSPITAL_COMMUNITY)
Admission: EM | Admit: 2014-05-31 | Discharge: 2014-06-01 | Disposition: A | Discharge status: Home / Self Care | Payer: Medicaid Other | Attending: Emergency Medicine | Admitting: Emergency Medicine

## 2014-05-31 ENCOUNTER — Emergency Department (HOSPITAL_COMMUNITY): Payer: Medicaid Other

## 2014-05-31 DIAGNOSIS — F840 Autistic disorder: Secondary | ICD-10-CM | POA: Diagnosis not present

## 2014-05-31 DIAGNOSIS — Z3202 Encounter for pregnancy test, result negative: Secondary | ICD-10-CM | POA: Insufficient documentation

## 2014-05-31 DIAGNOSIS — Z7951 Long term (current) use of inhaled steroids: Secondary | ICD-10-CM | POA: Insufficient documentation

## 2014-05-31 DIAGNOSIS — J45901 Unspecified asthma with (acute) exacerbation: Secondary | ICD-10-CM | POA: Insufficient documentation

## 2014-05-31 DIAGNOSIS — R1111 Vomiting without nausea: Secondary | ICD-10-CM | POA: Diagnosis present

## 2014-05-31 DIAGNOSIS — K5900 Constipation, unspecified: Secondary | ICD-10-CM | POA: Diagnosis not present

## 2014-05-31 DIAGNOSIS — Z8669 Personal history of other diseases of the nervous system and sense organs: Secondary | ICD-10-CM | POA: Diagnosis not present

## 2014-05-31 DIAGNOSIS — Z87448 Personal history of other diseases of urinary system: Secondary | ICD-10-CM | POA: Diagnosis not present

## 2014-05-31 DIAGNOSIS — Z79899 Other long term (current) drug therapy: Secondary | ICD-10-CM | POA: Diagnosis not present

## 2014-05-31 DIAGNOSIS — Z880 Allergy status to penicillin: Secondary | ICD-10-CM | POA: Diagnosis not present

## 2014-05-31 HISTORY — DX: Interstitial cystitis (chronic) without hematuria: N30.10

## 2014-05-31 LAB — CBC WITH DIFFERENTIAL/PLATELET
BASOS PCT: 0 % (ref 0–1)
Basophils Absolute: 0 10*3/uL (ref 0.0–0.1)
EOS ABS: 0 10*3/uL (ref 0.0–0.7)
EOS PCT: 0 % (ref 0–5)
HCT: 37.8 % (ref 36.0–46.0)
Hemoglobin: 12.8 g/dL (ref 12.0–15.0)
Lymphocytes Relative: 13 % (ref 12–46)
Lymphs Abs: 1.3 10*3/uL (ref 0.7–4.0)
MCH: 30.3 pg (ref 26.0–34.0)
MCHC: 33.9 g/dL (ref 30.0–36.0)
MCV: 89.4 fL (ref 78.0–100.0)
Monocytes Absolute: 0.8 10*3/uL (ref 0.1–1.0)
Monocytes Relative: 7 % (ref 3–12)
Neutro Abs: 8.2 10*3/uL — ABNORMAL HIGH (ref 1.7–7.7)
Neutrophils Relative %: 80 % — ABNORMAL HIGH (ref 43–77)
PLATELETS: 274 10*3/uL (ref 150–400)
RBC: 4.23 MIL/uL (ref 3.87–5.11)
RDW: 13.4 % (ref 11.5–15.5)
WBC: 10.4 10*3/uL (ref 4.0–10.5)

## 2014-05-31 LAB — URINALYSIS, ROUTINE W REFLEX MICROSCOPIC
Bilirubin Urine: NEGATIVE
GLUCOSE, UA: NEGATIVE mg/dL
HGB URINE DIPSTICK: NEGATIVE
Ketones, ur: 40 mg/dL — AB
Nitrite: NEGATIVE
Protein, ur: NEGATIVE mg/dL
SPECIFIC GRAVITY, URINE: 1.038 — AB (ref 1.005–1.030)
UROBILINOGEN UA: 0.2 mg/dL (ref 0.0–1.0)
pH: 6 (ref 5.0–8.0)

## 2014-05-31 LAB — COMPREHENSIVE METABOLIC PANEL
ALT: 38 U/L — AB (ref 0–35)
AST: 32 U/L (ref 0–37)
Albumin: 3.3 g/dL — ABNORMAL LOW (ref 3.5–5.2)
Alkaline Phosphatase: 46 U/L (ref 39–117)
Anion gap: 5 (ref 5–15)
BUN: 15 mg/dL (ref 6–23)
CALCIUM: 8.8 mg/dL (ref 8.4–10.5)
CO2: 27 mmol/L (ref 19–32)
Chloride: 106 mmol/L (ref 96–112)
Creatinine, Ser: 0.88 mg/dL (ref 0.50–1.10)
GFR calc Af Amer: >90 mL/min (ref >90)
GFR calc non Af Amer: >90 mL/min (ref >90)
Glucose, Bld: 108 mg/dL — ABNORMAL HIGH (ref 70–99)
Potassium: 4.3 mmol/L (ref 3.5–5.1)
SODIUM: 138 mmol/L (ref 135–145)
TOTAL PROTEIN: 6.3 g/dL (ref 6.0–8.3)
Total Bilirubin: 0.4 mg/dL (ref 0.3–1.2)

## 2014-05-31 LAB — URINE MICROSCOPIC-ADD ON

## 2014-05-31 LAB — POC URINE PREG, ED: PREG TEST UR: NEGATIVE

## 2014-05-31 NOTE — ED Notes (Signed)
Mother c/o's of vomiting after running and drinking dairy products-- per mom "pt is  Not supposed to run after taking dairy products" -- mother also states she is constipated-- pt is special needs pt.

## 2014-05-31 NOTE — ED Notes (Signed)
Pt's mom states that [name] has been holding down fluids since 1600. Was given ginger ale

## 2014-06-01 MED ORDER — ONDANSETRON HCL 4 MG PO TABS: 4.0000 mg | ORAL_TABLET | Freq: Four times a day (QID) | ORAL | Status: DC

## 2014-06-01 NOTE — ED Notes (Signed)
Tolerated Fluid challenge.

## 2014-06-01 NOTE — ED Provider Notes (Signed)
CSN: [id number]     Arrival date & time 05/31/14  1947 History   First MD Initiated Contact with Patient 05/31/14 2258     Chief Complaint  Patient presents with  . Emesis     (Consider location/radiation/quality/duration/timing/severity/associated sxs/prior Treatment) HPI  Lori Miles is a 19 yo female presenting to the ED today with a chief complaint of vomiting and wheezing. The patient is a special needs patient and is accompanied by her mother. The patient's mother provided her history. Pt has a h/o asthma, seizures, interstititis, and constipation. Mother states that the patient was at school and running outside to train for the school sponsored [employer]. The running aggravated the patient's asthma and she began to wheeze and vomit. She had also eaten milk products just before walking (the patient was running but is not medically cleared to run per mom), the mom states that the patient is not supposed to eat milk products, so this also was contributing to the her vomiting. Mother denies diarrhea, but states that the patient is chronically constipated. Pt also has h/o chronic UTIs and is on prophylactic antibiotic treatment. The patient is able to state that she has no pain and feels OK.    Past Medical History  Diagnosis Date  . CP (cerebral palsy)     mom denies  . Asthma   . Mental deficiency     hx of aggressive behavior,impaired speech   . Constipation   . Allergy   . Autism     mom states no actual dx, but admits she has some symptoms  . Frequency-urgency syndrome      some incontinence ; wears pull-ups  . Seizure     most recent sz " at the end of summer"  . Interstitial cystitis    Past Surgical History  Procedure Laterality Date  . Cystoscopy    . Cystoscopy with hydrodistension and biopsy  04/14/2012    Procedure: CYSTOSCOPY/BIOPSY/HYDRODISTENSION;  Surgeon: Lindaann Slough, MD;  Location: [facility];  Service: Urology;;  instillation of marcaine and  pyridium   Family History  Problem Relation Age of Onset  . Seizures Mother   . Seizures Sister     1 sister has   History  Substance Use Topics  . Smoking status: Never Smoker   . Smokeless tobacco: Never Used  . Alcohol Use: No   OB History    No data available     Review of Systems  10 Systems reviewed and are negative for acute change except as noted in the HPI.     Allergies  Abilify; Seroquel; Amoxicillin-pot clavulanate; and Keppra  Home Medications   Prior to Admission medications   Medication Sig Start Date End Date Taking? Authorizing Provider  albuterol (PROVENTIL) (2.5 MG/3ML) 0.083% nebulizer solution Take 2.5 mg by nebulization every 6 (six) hours as needed for wheezing or shortness of breath (wheezing).    Yes Historical Provider, MD  budesonide (PULMICORT) 0.5 MG/2ML nebulizer solution Take 0.25 mg by nebulization 2 (two) times daily. Per Dr. Willa Rough, Allergist   Yes Historical Provider, MD  cetirizine (ZYRTEC) 10 MG tablet Take 10 mg by mouth daily.  08/07/11  Yes Historical Provider, MD  cloNIDine (CATAPRES) 0.1 MG tablet Take 1 tab by mouth at bedtime. 03/14/14  Yes Deetta Perla, MD  Cranberry 500 MG CAPS Take 500 mg by mouth daily.   Yes Historical Provider, MD  DEPAKOTE 500 MG DR tablet Take 1 tablet twice per day 05/10/14  Yes Princella Ion, NP  dicyclomine (BENTYL) 20 MG tablet Take 1 tablet (20 mg total) by mouth 4 (four) times daily -  before meals and at bedtime. Patient taking differently: Take 20 mg by mouth 3 (three) times daily before meals.  02/28/14  Yes Antony Madura, PA-C  Levonorgestrel-Ethinyl Estradiol (SEASONIQUE) 0.15-0.03 &0.01 MG tablet Take 1 tablet by mouth daily. Take 1 active tablet daily, skip non active pills. 10/27/13  Yes Antionette Char, MD  Melatonin 3 MG TABS Take 3 mg by mouth at bedtime. For insomnia   Yes Historical Provider, MD  montelukast (SINGULAIR) 5 MG chewable tablet Chew 5 mg by mouth daily.   Yes  Historical Provider, MD  Multiple Vitamin (MULTIVITAMIN WITH MINERALS) TABS tablet Take 1 tablet by mouth daily.   Yes Historical Provider, MD  Olopatadine HCl 0.2 % SOLN Place 1 drop into both eyes daily.   Yes Historical Provider, MD  senna (SENOKOT) 8.6 MG TABS tablet Take 1 tablet (8.6 mg total) by mouth daily. 09/08/13 09/09/14 Yes Jon Gills, MD  traZODone (DESYREL) 50 MG tablet Take 2 tabs by mouth at bedtime 03/14/14  Yes Deetta Perla, MD  trimethoprim (TRIMPEX) 100 MG tablet Take 100 mg by mouth every morning.    Yes Historical Provider, MD  vitamin C (ASCORBIC ACID) 500 MG tablet Take 500 mg by mouth daily.   Yes Historical Provider, MD  fluconazole (DIFLUCAN) 100 MG tablet Take 1 tablet (100 mg total) by mouth once. Repeat in 48-72 hours. 05/19/14   Brock Bad, MD  ondansetron (ZOFRAN) 4 MG tablet Take 1 tablet (4 mg total) by mouth every 6 (six) hours. 06/01/14   Tadan Shill Neva Seat, PA-C  polyethylene glycol powder (MIRALAX) powder Take 17 g by mouth 2 (two) times daily. 05/26/13 05/27/14  Jon Gills, MD   BP 92/59 mmHg  Pulse 99  Temp(Src) 98.2 F (36.8 C) (Oral)  Resp 20  Ht  (1.448 m)  Wt 150 lb (68.04 kg)  BMI 32.45 kg/m2  SpO2 99%  LMP  Physical Exam  Constitutional: She appears well-developed and well-nourished. No distress.  HENT:  Head: Normocephalic and atraumatic.  Eyes: Pupils are equal, round, and reactive to light.  Neck: Normal range of motion. Neck supple.  Cardiovascular: Normal rate and regular rhythm.   Pulmonary/Chest: Effort normal. No respiratory distress. She has no wheezes.  Abdominal: Soft. Bowel sounds are normal. She exhibits no distension and no fluid wave. There is no tenderness. There is no rigidity, no rebound, no guarding and no CVA tenderness.  Neurological: She is alert.  Skin: Skin is warm and dry.  Psychiatric:  Pt acting at baseline per mom  Nursing note and vitals reviewed.   ED Course  Procedures (including critical  care time) Labs Review Labs Reviewed  CBC WITH DIFFERENTIAL/PLATELET - Abnormal; Notable for the following:    Neutrophils Relative % 80 (*)    Neutro Abs 8.2 (*)    All other components within normal limits  COMPREHENSIVE METABOLIC PANEL - Abnormal; Notable for the following:    Glucose, Bld 108 (*)    Albumin 3.3 (*)    ALT 38 (*)    All other components within normal limits  URINALYSIS, ROUTINE W REFLEX MICROSCOPIC - Abnormal; Notable for the following:    Specific Gravity, Urine 1.038 (*)    Ketones, ur 40 (*)    Leukocytes, UA SMALL (*)    All other components within normal limits  URINE MICROSCOPIC-ADD  ON - Abnormal; Notable for the following:    Squamous Epithelial / LPF FEW (*)    All other components within normal limits  POC URINE PREG, ED    Imaging Review Dg Abd Acute W/chest  06/01/2014   CLINICAL DATA:  Wheezing and emesis  EXAM: ACUTE ABDOMEN SERIES (ABDOMEN 2 VIEW & CHEST 1 VIEW)  COMPARISON:  02/28/2014  FINDINGS: There is no evidence of dilated bowel loops or free intraperitoneal air. No radiopaque calculi or other significant radiographic abnormality is seen. Heart size and mediastinal contours are within normal limits. Both lungs are clear.  IMPRESSION: Negative abdominal radiographs.  No acute cardiopulmonary disease.   Electronically Signed   By: Ellery Plunkaniel R Mitchell M.D.   On: 06/01/2014 00:10     EKG Interpretation None      MDM   Final diagnoses:  Non-intractable vomiting without nausea, vomiting of unspecified type     Patient presents to the ED today complaining of wheezing and vomiting, both attributed to her asthma per patient's mother. Her labs in the ED were unremarkable. Chest and Abdomen XR were normal. The patient does not appear to be in any acute distress. Her breath sounds are clear bilaterally and her abdomen is soft and non-tender. Pending a PO challenge, she will be ready for discharge.  Pt has had no pain or nausea/vomiting since in the ER  for over the past 5 hours. Eaten and drank in the ED with no adverse events. She has not had any abdominal pain. No coughing. - the patients urine had small leukocytes, per mom she is on chronic antibiotics- will send out culture for further evaluation. Pt instructed to follow-up with PCP.    18 y.o.Lori Miles's evaluation in the Emergency Department is complete. It has been determined that no acute conditions requiring further emergency intervention are present at this time. The patient/guardian have been advised of the diagnosis and plan. We have discussed signs and symptoms that warrant return to the ED, such as changes or worsening in symptoms.  Vital signs are stable at discharge. Filed Vitals:   06/01/14 0016  BP: 92/59  Pulse: 99  Temp:   Resp: 20    Patient/guardian has voiced understanding and agreed to follow-up with the PCP or specialist.     Marlon Peliffany Adonia Porada, PA-C 06/01/14 0035  Marlon Peliffany Rosezetta Balderston, PA-C 06/01/14 0036  Marlon Peliffany Catrice Zuleta, PA-C 06/01/14 0037  Samuel JesterKathleen McManus, DO 06/03/14 1255

## 2014-06-02 LAB — URINE CULTURE: Colony Count: 60000

## 2014-07-03 ENCOUNTER — Emergency Department (HOSPITAL_COMMUNITY)
Admission: EM | Admit: 2014-07-03 | Discharge: 2014-07-03 | Disposition: A | Discharge status: Home / Self Care | Payer: Medicaid Other | Attending: Emergency Medicine | Admitting: Emergency Medicine

## 2014-07-03 ENCOUNTER — Encounter (HOSPITAL_COMMUNITY): Payer: Self-pay | Admitting: Emergency Medicine

## 2014-07-03 DIAGNOSIS — Z8719 Personal history of other diseases of the digestive system: Secondary | ICD-10-CM | POA: Insufficient documentation

## 2014-07-03 DIAGNOSIS — Z880 Allergy status to penicillin: Secondary | ICD-10-CM | POA: Diagnosis not present

## 2014-07-03 DIAGNOSIS — Z7951 Long term (current) use of inhaled steroids: Secondary | ICD-10-CM | POA: Insufficient documentation

## 2014-07-03 DIAGNOSIS — Z87448 Personal history of other diseases of urinary system: Secondary | ICD-10-CM | POA: Insufficient documentation

## 2014-07-03 DIAGNOSIS — Z79899 Other long term (current) drug therapy: Secondary | ICD-10-CM | POA: Diagnosis not present

## 2014-07-03 DIAGNOSIS — J029 Acute pharyngitis, unspecified: Secondary | ICD-10-CM | POA: Diagnosis not present

## 2014-07-03 DIAGNOSIS — R32 Unspecified urinary incontinence: Secondary | ICD-10-CM | POA: Insufficient documentation

## 2014-07-03 DIAGNOSIS — R350 Frequency of micturition: Secondary | ICD-10-CM | POA: Diagnosis not present

## 2014-07-03 DIAGNOSIS — F840 Autistic disorder: Secondary | ICD-10-CM | POA: Insufficient documentation

## 2014-07-03 DIAGNOSIS — Z8669 Personal history of other diseases of the nervous system and sense organs: Secondary | ICD-10-CM | POA: Insufficient documentation

## 2014-07-03 DIAGNOSIS — Z3202 Encounter for pregnancy test, result negative: Secondary | ICD-10-CM | POA: Insufficient documentation

## 2014-07-03 DIAGNOSIS — J45909 Unspecified asthma, uncomplicated: Secondary | ICD-10-CM | POA: Diagnosis not present

## 2014-07-03 LAB — URINALYSIS, ROUTINE W REFLEX MICROSCOPIC
BILIRUBIN URINE: NEGATIVE
Glucose, UA: NEGATIVE mg/dL
HGB URINE DIPSTICK: NEGATIVE
Ketones, ur: 15 mg/dL — AB
NITRITE: NEGATIVE
PH: 6 (ref 5.0–8.0)
Protein, ur: 30 mg/dL — AB
SPECIFIC GRAVITY, URINE: 1.04 — AB (ref 1.005–1.030)
UROBILINOGEN UA: 1 mg/dL (ref 0.0–1.0)

## 2014-07-03 LAB — URINE MICROSCOPIC-ADD ON

## 2014-07-03 LAB — PREGNANCY, URINE: Preg Test, Ur: NEGATIVE

## 2014-07-03 NOTE — ED Notes (Signed)
MD at bedside. 

## 2014-07-03 NOTE — ED Notes (Signed)
Mom believes pt may have a UTI because she has been incontinent of urine.  Also reports sore throat.

## 2014-07-03 NOTE — ED Provider Notes (Signed)
CSN: [id number]     Arrival date & time 07/03/14  2040 History   First MD Initiated Contact with Patient 07/03/14 2052     Chief Complaint  Patient presents with  . Sore Throat  . Urinary Tract Infection    HPI History is provided by the patient's mother. Patient is 19 year old and has history of autism and diminished mental capacity. She has very limited speech. She is able to use the restroom with some assistance. The patient has been having urinary frequency and incontinence this past week. Mom also thinks she may have an odor to the urine. Patient does have history of urinary tract infections as well as interstitial cystitis. Mom was concerned and brought her in for evaluation. She was complaining of some sore throat earlier but that has not persisted. She has not had any trouble with any fevers. No diarrhea. She did have one episode of vomiting but mom thinks that's when she became anxious and agitated. Her appetite has otherwise been fine. Past Medical History  Diagnosis Date  . CP (cerebral palsy)     mom denies  . Asthma   . Mental deficiency     hx of aggressive behavior,impaired speech   . Constipation   . Allergy   . Autism     mom states no actual dx, but admits she has some symptoms  . Frequency-urgency syndrome      some incontinence ; wears pull-ups  . Seizure     most recent sz " at the end of summer"  . Interstitial cystitis    Past Surgical History  Procedure Laterality Date  . Cystoscopy    . Cystoscopy with hydrodistension and biopsy  04/14/2012    Procedure: CYSTOSCOPY/BIOPSY/HYDRODISTENSION;  Surgeon: Lindaann SloughMarc-Henry Nesi, MD;  [facility];  Service: Urology;;  instillation of marcaine and pyridium   Family History  Problem Relation Age of Onset  . Seizures Mother   . Seizures Sister     1 sister has   History  Substance Use Topics  . Smoking status: Never Smoker   . Smokeless tobacco: Never Used  . Alcohol Use: No   OB History    No data available     Review of Systems  All other systems reviewed and are negative.     Allergies  Abilify; Seroquel; Amoxicillin-pot clavulanate; and Keppra  Home Medications   Prior to Admission medications   Medication Sig Start Date End Date Taking? Authorizing Provider  albuterol (PROVENTIL) (2.5 MG/3ML) 0.083% nebulizer solution Take 2.5 mg by nebulization every 6 (six) hours as needed for wheezing or shortness of breath (wheezing).     Historical Provider, MD  budesonide (PULMICORT) 0.5 MG/2ML nebulizer solution Take 0.25 mg by nebulization 2 (two) times daily. Per Dr. Willa RoughHicks, Allergist    Historical Provider, MD  cetirizine (ZYRTEC) 10 MG tablet Take 10 mg by mouth daily.  08/07/11   Historical Provider, MD  cloNIDine (CATAPRES) 0.1 MG tablet Take 1 tab by mouth at bedtime. 03/14/14   Deetta PerlaWilliam H Hickling, MD  Cranberry 500 MG CAPS Take 500 mg by mouth daily.    Historical Provider, MD  DEPAKOTE 500 MG DR tablet Take 1 tablet twice per day 05/10/14   Princella Ionina P Goodpasture, NP  dicyclomine (BENTYL) 20 MG tablet Take 1 tablet (20 mg total) by mouth 4 (four) times daily -  before meals and at bedtime. Patient taking differently: Take 20 mg by mouth 3 (three) times daily before meals.  02/28/14  Antony Madura, PA-C  fluconazole (DIFLUCAN) 100 MG tablet Take 1 tablet (100 mg total) by mouth once. Repeat in 48-72 hours. 05/19/14   Brock Bad, MD  Levonorgestrel-Ethinyl Estradiol (SEASONIQUE) 0.15-0.03 &0.01 MG tablet Take 1 tablet by mouth daily. Take 1 active tablet daily, skip non active pills. 10/27/13   Antionette Char, MD  Melatonin 3 MG TABS Take 3 mg by mouth at bedtime. For insomnia    Historical Provider, MD  montelukast (SINGULAIR) 5 MG chewable tablet Chew 5 mg by mouth daily.    Historical Provider, MD  Multiple Vitamin (MULTIVITAMIN WITH MINERALS) TABS tablet Take 1 tablet by mouth daily.    Historical Provider, MD  Olopatadine HCl 0.2 % SOLN Place 1 drop into both eyes  daily.    Historical Provider, MD  ondansetron (ZOFRAN) 4 MG tablet Take 1 tablet (4 mg total) by mouth every 6 (six) hours. 06/01/14   Tiffany Neva Seat, PA-C  polyethylene glycol powder (MIRALAX) powder Take 17 g by mouth 2 (two) times daily. 05/26/13 05/27/14   Gills, MD  senna (SENOKOT) 8.6 MG TABS tablet Take 1 tablet (8.6 mg total) by mouth daily. 09/08/13 09/09/14   Gills, MD  traZODone (DESYREL) 50 MG tablet Take 2 tabs by mouth at bedtime 03/14/14   Deetta Perla, MD  trimethoprim (TRIMPEX) 100 MG tablet Take 100 mg by mouth every morning.     Historical Provider, MD  vitamin C (ASCORBIC ACID) 500 MG tablet Take 500 mg by mouth daily.    Historical Provider, MD   BP 109/71 mmHg  Pulse 90  Temp(Src) 98.2 F (36.8 C) (Oral)  Resp 18  Wt 168 lb 2 oz (76.261 kg)  SpO2 98% Physical Exam  Constitutional: She appears well-developed and well-nourished. No distress.  HENT:  Head: Normocephalic and atraumatic.  Right Ear: External ear normal.  Left Ear: External ear normal.  Mouth/Throat: No oropharyngeal exudate.  Eyes: Conjunctivae are normal. Right eye exhibits no discharge. Left eye exhibits no discharge. No scleral icterus.  Neck: Neck supple. No tracheal deviation present.  Cardiovascular: Normal rate, regular rhythm and intact distal pulses.   Pulmonary/Chest: Effort normal and breath sounds normal. No stridor. No respiratory distress. She has no wheezes. She has no rales.  Abdominal: Soft. Bowel sounds are normal. She exhibits no distension. There is no tenderness. There is no rebound and no guarding.  Musculoskeletal: She exhibits no edema or tenderness.  Neurological: She is alert. She has normal strength. No cranial nerve deficit (no facial droop, extraocular movements intact, no slurred speech) or sensory deficit. She exhibits normal muscle tone. She displays no seizure activity. Coordination normal.  Skin: Skin is warm and dry. No rash noted. She is not  diaphoretic.  Psychiatric: She has a normal mood and affect.  Nursing note and vitals reviewed.   ED Course  Procedures (including critical care time) Labs Review Labs Reviewed  URINALYSIS, ROUTINE W REFLEX MICROSCOPIC - Abnormal; Notable for the following:    Color, Urine AMBER (*)    APPearance CLOUDY (*)    Specific Gravity, Urine 1.040 (*)    Ketones, ur 15 (*)    Protein, ur 30 (*)    Leukocytes, UA TRACE (*)    All other components within normal limits  URINE MICROSCOPIC-ADD ON - Abnormal; Notable for the following:    Bacteria, UA FEW (*)    All other components within normal limits  PREGNANCY, URINE    MDM   Final diagnoses:  Incontinence    Pt does have history of incontinence in the past.  Mom states it has been a while though so she was concerned about a possible uti.  Exam is otherwise unremarkable.  Afebrile.  Doubt uti based on ua.  Urine culture sent off.  Follow up with PCP  Linwood Dibbles, MD 07/03/14 2224

## 2014-07-05 LAB — URINE CULTURE

## 2014-07-18 ENCOUNTER — Encounter (HOSPITAL_COMMUNITY): Payer: Self-pay

## 2014-07-18 ENCOUNTER — Emergency Department (HOSPITAL_COMMUNITY)
Admission: EM | Admit: 2014-07-18 | Discharge: 2014-07-18 | Disposition: A | Discharge status: Home / Self Care | Payer: Medicaid Other | Attending: Emergency Medicine | Admitting: Emergency Medicine

## 2014-07-18 DIAGNOSIS — G40909 Epilepsy, unspecified, not intractable, without status epilepticus: Secondary | ICD-10-CM | POA: Diagnosis not present

## 2014-07-18 DIAGNOSIS — F840 Autistic disorder: Secondary | ICD-10-CM | POA: Diagnosis not present

## 2014-07-18 DIAGNOSIS — R1110 Vomiting, unspecified: Secondary | ICD-10-CM | POA: Diagnosis not present

## 2014-07-18 DIAGNOSIS — Z7951 Long term (current) use of inhaled steroids: Secondary | ICD-10-CM | POA: Diagnosis not present

## 2014-07-18 DIAGNOSIS — K5900 Constipation, unspecified: Secondary | ICD-10-CM | POA: Insufficient documentation

## 2014-07-18 DIAGNOSIS — Z880 Allergy status to penicillin: Secondary | ICD-10-CM | POA: Insufficient documentation

## 2014-07-18 DIAGNOSIS — Z79899 Other long term (current) drug therapy: Secondary | ICD-10-CM | POA: Diagnosis not present

## 2014-07-18 DIAGNOSIS — N390 Urinary tract infection, site not specified: Secondary | ICD-10-CM | POA: Insufficient documentation

## 2014-07-18 DIAGNOSIS — J45909 Unspecified asthma, uncomplicated: Secondary | ICD-10-CM | POA: Diagnosis not present

## 2014-07-18 DIAGNOSIS — R300 Dysuria: Secondary | ICD-10-CM | POA: Diagnosis present

## 2014-07-18 LAB — URINE MICROSCOPIC-ADD ON

## 2014-07-18 LAB — URINALYSIS, ROUTINE W REFLEX MICROSCOPIC
BILIRUBIN URINE: NEGATIVE
Glucose, UA: NEGATIVE mg/dL
Hgb urine dipstick: NEGATIVE
KETONES UR: 15 mg/dL — AB
Nitrite: NEGATIVE
PH: 6.5 (ref 5.0–8.0)
Protein, ur: NEGATIVE mg/dL
SPECIFIC GRAVITY, URINE: 1.028 (ref 1.005–1.030)
UROBILINOGEN UA: 1 mg/dL (ref 0.0–1.0)

## 2014-07-18 MED ORDER — ONDANSETRON 8 MG PO TBDP: 8.0000 mg | ORAL_TABLET | Freq: Three times a day (TID) | ORAL | Status: DC | PRN

## 2014-07-18 MED ORDER — NITROFURANTOIN MONOHYD MACRO 100 MG PO CAPS
100.0000 mg | ORAL_CAPSULE | Freq: Once | ORAL | Status: AC
  Administered 2014-07-18: 100 mg via ORAL
  Filled 2014-07-18: qty 1

## 2014-07-18 MED ORDER — PHENAZOPYRIDINE HCL 200 MG PO TABS: 200.0000 mg | ORAL_TABLET | Freq: Three times a day (TID) | ORAL | Status: DC

## 2014-07-18 MED ORDER — ONDANSETRON 4 MG PO TBDP
8.0000 mg | ORAL_TABLET | Freq: Once | ORAL | Status: AC
  Administered 2014-07-18: 8 mg via ORAL
  Filled 2014-07-18: qty 2

## 2014-07-18 MED ORDER — PHENAZOPYRIDINE HCL 100 MG PO TABS
200.0000 mg | ORAL_TABLET | Freq: Once | ORAL | Status: AC
  Administered 2014-07-18: 200 mg via ORAL
  Filled 2014-07-18: qty 2

## 2014-07-18 MED ORDER — NITROFURANTOIN MONOHYD MACRO 100 MG PO CAPS: 100.0000 mg | ORAL_CAPSULE | Freq: Two times a day (BID) | ORAL | Status: DC

## 2014-07-18 NOTE — ED Notes (Addendum)
Pt. Is having pain in her vaginal area and has a hx of interstitial cystitis.  Pt. Was at school and had an episode of vomiting on her self .  Vomiting can trigger seizures , which mother is worried. Pt has not had a seizure today.   Pt is alert and oriented to self and place.  Pt. Is autistic.  Mom is with the pt.

## 2014-07-18 NOTE — ED Provider Notes (Signed)
CSN: [id number]     Arrival date & time 07/18/14  1528 History   First MD Initiated Contact with Patient 07/18/14 1833     Chief Complaint  Patient presents with  . Urinary Tract Infection     (Consider location/radiation/quality/duration/timing/severity/associated sxs/prior Treatment) HPI Lori Miles is a 19 y.o. female history of cerebral palsy, autism, chronic constipation, interstitial cystitis, presents to emergency department because her mother was concerned of possible UTI. Mother states patient started pulling on her depends today. She had one episode of vomiting after she had a "panic attack." She states is not unusual for her to vomit after panic attacks. She states that she has seen a urologist, but states they're not doing anything for her except for keeping her on daily antibiotic. Patient is currently on Bactrim? Mother denies any fevers. She states patient mental status is at her baseline. Patient is denying abdominal back pain. No other complaints. Mother brought patient to the ER because she is worried that this may trigger her seizures. No seizures today.  Past Medical History  Diagnosis Date  . CP (cerebral palsy)     mom denies  . Asthma   . Mental deficiency     hx of aggressive behavior,impaired speech   . Constipation   . Allergy   . Autism     mom states no actual dx, but admits she has some symptoms  . Frequency-urgency syndrome      some incontinence ; wears pull-ups  . Seizure     most recent sz " at the end of summer"  . Interstitial cystitis    Past Surgical History  Procedure Laterality Date  . Cystoscopy    . Cystoscopy with hydrodistension and biopsy  04/14/2012    Procedure: CYSTOSCOPY/BIOPSY/HYDRODISTENSION;  Surgeon: Lindaann Slough, MD;  Location: [facility] [place];  Service: Urology;;  instillation of marcaine and pyridium   Family History  Problem Relation Age of Onset  . Seizures Mother   . Seizures Sister     1 sister has    History  Substance Use Topics  . Smoking status: Never Smoker   . Smokeless tobacco: Never Used  . Alcohol Use: No   OB History    No data available     Review of Systems  Unable to perform ROS: Patient nonverbal  Gastrointestinal: Positive for vomiting.  Genitourinary: Positive for dysuria.  All other systems reviewed and are negative.     Allergies  Abilify; Seroquel; Amoxicillin-pot clavulanate; and Keppra  Home Medications   Prior to Admission medications   Medication Sig Start Date End Date Taking? Authorizing Provider  albuterol (PROVENTIL) (2.5 MG/3ML) 0.083% nebulizer solution Take 2.5 mg by nebulization every 6 (six) hours as needed for wheezing or shortness of breath (wheezing).    Yes Historical Provider, MD  budesonide (PULMICORT) 0.5 MG/2ML nebulizer solution Take 0.25 mg by nebulization 2 (two) times daily. Per Dr. Willa Rough, Allergist   Yes Historical Provider, MD  cetirizine (ZYRTEC) 10 MG tablet Take 10 mg by mouth daily.  08/07/11  Yes Historical Provider, MD  cloNIDine (CATAPRES) 0.1 MG tablet Take 1 tab by mouth at bedtime. 03/14/14  Yes Deetta Perla, MD  Cranberry 500 MG CAPS Take 500 mg by mouth daily.   Yes Historical Provider, MD  DEPAKOTE 500 MG DR tablet Take 1 tablet twice per day 05/10/14  Yes Princella Ion, NP  dicyclomine (BENTYL) 20 MG tablet Take 1 tablet (20 mg total) by mouth 4 (four) times  daily -  before meals and at bedtime. Patient taking differently: Take 20 mg by mouth 3 (three) times daily before meals.  02/28/14  Yes Antony Madura, PA-C  fluconazole (DIFLUCAN) 100 MG tablet Take 1 tablet (100 mg total) by mouth once. Repeat in 48-72 hours. 05/19/14  Yes Brock Bad, MD  Levonorgestrel-Ethinyl Estradiol (SEASONIQUE) 0.15-0.03 &0.01 MG tablet Take 1 tablet by mouth daily. Take 1 active tablet daily, skip non active pills. 10/27/13  Yes Antionette Char, MD  Melatonin 3 MG TABS Take 3 mg by mouth at bedtime. For insomnia   Yes  Historical Provider, MD  montelukast (SINGULAIR) 5 MG chewable tablet Chew 5 mg by mouth daily.   Yes Historical Provider, MD  Multiple Vitamin (MULTIVITAMIN WITH MINERALS) TABS tablet Take 1 tablet by mouth daily.   Yes Historical Provider, MD  Olopatadine HCl 0.2 % SOLN Place 1 drop into both eyes daily.   Yes Historical Provider, MD  ondansetron (ZOFRAN) 4 MG tablet Take 1 tablet (4 mg total) by mouth every 6 (six) hours. Patient taking differently: Take 4 mg by mouth every 8 (eight) hours as needed.  06/01/14  Yes Tiffany Neva Seat, PA-C  senna (SENOKOT) 8.6 MG TABS tablet Take 1 tablet (8.6 mg total) by mouth daily. 09/08/13 09/09/14 Yes Jon Gills, MD  traZODone (DESYREL) 50 MG tablet Take 2 tabs by mouth at bedtime 03/14/14  Yes Deetta Perla, MD  trimethoprim (TRIMPEX) 100 MG tablet Take 100 mg by mouth every morning.    Yes Historical Provider, MD  vitamin C (ASCORBIC ACID) 500 MG tablet Take 500 mg by mouth daily.   Yes Historical Provider, MD  polyethylene glycol powder (MIRALAX) powder Take 17 g by mouth 2 (two) times daily. 05/26/13 05/27/14  Jon Gills, MD   BP 107/60 mmHg  Pulse 96  Temp(Src) 98.6 F (37 C) (Oral)  Resp 22  SpO2 99% Physical Exam  Constitutional: She appears well-developed and well-nourished. No distress.  HENT:  Head: Normocephalic.  Eyes: Conjunctivae are normal.  Neck: Neck supple.  Cardiovascular: Normal rate, regular rhythm and normal heart sounds.   Pulmonary/Chest: Effort normal and breath sounds normal. No respiratory distress. She has no wheezes. She has no rales.  Abdominal: Soft. Bowel sounds are normal. She exhibits no distension. There is no tenderness. There is no rebound and no guarding.  Musculoskeletal: She exhibits no edema.  Neurological: She is alert.  Skin: Skin is warm and dry.  Psychiatric: She has a normal mood and affect. Her behavior is normal.  Nursing note and vitals reviewed.   ED Course  Procedures (including  critical care time) Labs Review Labs Reviewed  URINALYSIS, ROUTINE W REFLEX MICROSCOPIC - Abnormal; Notable for the following:    Color, Urine AMBER (*)    APPearance CLOUDY (*)    Ketones, ur 15 (*)    Leukocytes, UA SMALL (*)    All other components within normal limits  URINE MICROSCOPIC-ADD ON - Abnormal; Notable for the following:    Squamous Epithelial / LPF FEW (*)    Bacteria, UA FEW (*)    All other components within normal limits  URINE CULTURE    Imaging Review No results found.   EKG Interpretation None      MDM   Final diagnoses:  UTI (lower urinary tract infection)    patient is a 19 year old with history of autism, cerebral palsy, with diagnosed interstitial cystitis. She is here with pain with urination. One episode of vomiting  earlier today. Patient's currently in no acute distress. She is well appearing on exam. Abdomen is nontender. Vital signs all within normal. Small leukocytes with few bacteria. Mother states the only thing that helps when this happens is Macrobid. Will start on macrobid. Also will add pyridium, zofran. Follow up with pcp. Pt was given first doses in ED. She is tolerating POs.   Filed Vitals:   07/18/14 1623 07/18/14 1821 07/18/14 1845  BP: 114/66 107/60 109/78  Pulse: 105 96 87  Temp: 98.6 F (37 C)    TempSrc: Oral    Resp: 22  20  SpO2: 96% 99% 100%     Jaynie Crumbleatyana Jensen [place], PA-C 07/18/14 2227  Jerelyn ScottMartha Linker, MD 07/18/14 2232

## 2014-07-18 NOTE — Discharge Instructions (Signed)
Start Macrobid as prescribed. Zofran as needed for nausea. Pyridium for dysuria. Follow-up with your doctor as soon as able. Return if worsening symptoms  Interstitial Cystitis Interstitial cystitis (IC) is a condition that results in discomfort or pain in the bladder and the surrounding pelvic region. The symptoms can be different from case to case and even in the same individual. People may experience:  Mild discomfort.  Pressure.  Tenderness.  Intense pain in the bladder and pelvic area. CAUSES  Because IC varies so much in symptoms and severity, people studying this disease believe it is not one but several diseases. Some caregivers use the term painful bladder syndrome (PBS) to describe cases with painful urinary symptoms. This may not meet the strictest definition of IC. The term IC / PBS includes all cases of urinary pain that cannot be connected to other causes, such as infection or urinary stones.  SYMPTOMS  Symptoms may include:  An urgent need to urinate.  A frequent need to urinate.  A combination of these symptoms. Pain may change in intensity as the bladder fills with urine or as it empties. Women's symptoms often get worse during menstruation. They may sometimes experience pain with vaginal intercourse. Some of the symptoms of IC / PBS seem like those of bacterial infection. Tests do not show infection. IC / PBS is far more common in women than in men.  DIAGNOSIS  The diagnosis of IC / PBS is based on:  Presence of pain related to the bladder, usually along with problems of frequency and urgency.  Not finding other diseases that could cause the symptoms.  Diagnostic tests that help rule out other diseases include:  Urinalysis.  Urine culture.  Cystoscopy.  Biopsy of the bladder wall.  Distension of the bladder under anesthesia.  Urine cytology.  Laboratory examination of prostate secretions. A biopsy is a tissue sample that can be looked at under a  microscope. Samples of the bladder and urethra may be removed during a cystoscopy. A biopsy helps rule out bladder cancer. TREATMENT  Scientists have not yet found a cure for IC / PBS. Patients with IC / PBS do not get better with antibiotic therapy. Caregivers cannot predict who will respond best to which treatment. Symptoms may disappear without explanation. Disappearing symptoms may coincide with an event such as a change in diet or treatment. Even when symptoms disappear, they may return after days, weeks, months, or years.  Because the causes of IC / PBS are unknown, current treatments are aimed at relieving symptoms. Many people are helped by one or a combination of the treatments. As researchers learn more about IC / PBS, the list of potential treatments will change. Patients should discuss their options with a caregiver. SURGERY  Surgery should be considered only if all available treatments have failed and the pain is disabling. Many approaches and techniques are used. Each approach has its own advantages and complications. Advantages and complications should be discussed with a urologist. Your caregiver may recommend consulting another urologist for a second opinion. Most caregivers are reluctant to operate because the outcome is unpredictable. Some people still have symptoms after surgery.  People considering surgery should discuss the potential risks and benefits, side effects, and long- and short-term complications with their family, as well as with people who have already had the procedure. Surgery requires anesthesia, hospitalization, and in some cases weeks or months of recovery. As the complexity of the procedure increases, so do the chances for complications and for failure.  HOME CARE INSTRUCTIONS   All drugs, even those sold over the counter, have side effects. Patients should always consult a caregiver before using any drug for an extended amount of time. Only take over-the-counter or  prescription medicines for pain, discomfort, or fever as directed by your caregiver.  Many patients feel that smoking makes their symptoms worse. How the by-products of tobacco that are excreted in the urine affect IC / PBS is unknown. Smoking is the major known cause of bladder cancer. One of the best things smokers can do for their bladder and their overall health is to quit.  Many patients feel that gentle stretching exercises help relieve IC / PBS symptoms.  Methods vary, but basically patients decide to empty their bladder at designated times and use relaxation techniques and distractions to keep to the schedule. Gradually, patients try to lengthen the time between scheduled voids. A diary in which to record voiding times is usually helpful in keeping track of progress. MAKE SURE YOU:   Understand these instructions.  Will watch your condition.  Will get help right away if you are not doing well or get worse. Document Released: 11/03/2003 Document Revised: 05/27/2011 Document Reviewed: 01/18/2008 [facility] Patient Information 2015 [place], [place]. This information is not intended to replace advice given to you by your health care provider. Make sure you discuss any questions you have with your health care provider.

## 2014-07-20 LAB — URINE CULTURE: Colony Count: 40000

## 2014-08-24 ENCOUNTER — Ambulatory Visit: Payer: Medicaid Other | Admitting: Family

## 2014-08-29 ENCOUNTER — Encounter: Payer: Self-pay | Admitting: Family

## 2014-08-29 ENCOUNTER — Ambulatory Visit (INDEPENDENT_AMBULATORY_CARE_PROVIDER_SITE_OTHER): Payer: Medicaid Other | Admitting: Family

## 2014-08-29 VITALS — BP 94/66 | HR 90 | Ht 71.0 in | Wt 166.6 lb

## 2014-08-29 DIAGNOSIS — N3944 Nocturnal enuresis: Secondary | ICD-10-CM

## 2014-08-29 DIAGNOSIS — G40209 Localization-related (focal) (partial) symptomatic epilepsy and epileptic syndromes with complex partial seizures, not intractable, without status epilepticus: Secondary | ICD-10-CM

## 2014-08-29 DIAGNOSIS — G40309 Generalized idiopathic epilepsy and epileptic syndromes, not intractable, without status epilepticus: Secondary | ICD-10-CM

## 2014-08-29 DIAGNOSIS — G4700 Insomnia, unspecified: Secondary | ICD-10-CM | POA: Diagnosis not present

## 2014-08-29 DIAGNOSIS — F79 Unspecified intellectual disabilities: Secondary | ICD-10-CM | POA: Diagnosis not present

## 2014-08-29 DIAGNOSIS — E669 Obesity, unspecified: Secondary | ICD-10-CM

## 2014-08-29 DIAGNOSIS — M205X9 Other deformities of toe(s) (acquired), unspecified foot: Secondary | ICD-10-CM

## 2014-08-29 NOTE — Progress Notes (Signed)
Patient: Lori Miles MRN: [id number] Sex: female DOB: Jul 10, 1995  Provider: Elveria Rising, NP Location of Care: [facility]  Note type: Routine return visit  History of Present Illness: Referral Source: Dr. Fleet Contras History from: mother Chief Complaint: Generalized Convulsive Epilepsy  Lori Miles is a 19 y.o. with history of localization related seizures with secondary generalization, intellectual disabilities, episodic recurrent urinary tract infections, episodic urinary incontinence, and insomnia. She was last seen on February 22, 2014. She has been seizure free since she was last seen. Mom says that she is sleeping fairly well on the combination of Clonidine and Trazodone. When she was last seen, Lori Miles had been steadily gaining weight. Mom has been working to limit her intake both at meals and with snacks, and Lori Miles has lost 5 lbs. She has a large appetite and Mom has to be vigilant about her diet.   Mom is concerned today because Lori Miles turns her toes inward, and fatigues quickly when walking. She also complains of some foot and lower leg pain when walking. Mom said that she took her to the circus this winter, and that Lori Miles became so fatigued walking that she ended up falling to the ground. EMS was summoned since they were in a public place. Mom asked about a PT referral for her legs as well as to complete the process for a wheelchair or stroller.    Lori Miles continues to  slow progress at school. Mom says that there are no new problems with behavior. She is impulsive and has extremely limited understanding of consequences of actions. Mom says that she is unable to dress herself without assistance. She can go to the toilet by herself but needs help with toilet hygiene. She is unable to bathe herself or administer medications to herself.   Lori Miles has had some problems with frequent UTI's, but Mom says that is improving. She has been otherwise healthy  and Mom has no other health concerns about Lori Miles than previously mentioned. Review of Systems: Please see the HPI for neurologic and other pertinent review of systems. Otherwise, the following systems are noncontributory including constitutional, eyes, ears, nose and throat, cardiovascular, respiratory, gastrointestinal, genitourinary, musculoskeletal, skin, endocrine, hematologic/lymph, allergic/immunologic and psychiatric.   Past Medical History  Diagnosis Date  . CP (cerebral palsy)     mom denies  . Asthma   . Mental deficiency     hx of aggressive behavior,impaired speech   . Constipation   . Allergy   . Autism     mom states no actual dx, but admits she has some symptoms  . Frequency-urgency syndrome      some incontinence ; wears pull-ups  . Seizure     most recent sz " at the end of summer"  . Interstitial cystitis    Hospitalizations: Yes.  , Head Injury: No., Nervous System Infections: No., Immunizations up to date: Yes.   Past Medical History Comments:  EEG on September 14, 2011, was normal. She was able to be successfully weaned off Keppra. The patient has significant issues with aggression, which was thought to be related to Keppra, but has persisted once it was discontinued. The patient was treated with Depakote both for behavior and possible seizures. She had palpitations and severe constipation on Seroquel. She had marked weight gain on Abilify and Depakote. When generic Divalproex was tried she had breakthrough seizures.   Nocturnal polysomnogram on October 27, 2011, failed to show significant periodic limb movements, cyanosis, sleep apnea, or cardiac arrhythmia.  EEG on February 20, 2012, was a normal study with the patient awake.  Surgical History Past Surgical History  Procedure Laterality Date  . Cystoscopy    . Cystoscopy with hydrodistension and biopsy  04/14/2012    Procedure: CYSTOSCOPY/BIOPSY/HYDRODISTENSION;  Surgeon: Lindaann Slough, MD;  [facility];  Service: Urology;;  instillation of marcaine and pyridium    Family History family history includes Seizures in her mother and sister. Family History is otherwise negative for migraines, seizures, cognitive impairment, blindness, deafness, birth defects, chromosomal disorder, autism.  Social History History   Social History  . Marital Status: Single    Spouse Name: N/A  . Number of Children: N/A  . Years of Education: N/A   Social History Main Topics  . Smoking status: Never Smoker   . Smokeless tobacco: Never Used  . Alcohol Use: No  . Drug Use: No  . Sexual Activity: No   Other Topics Concern  . None   Social History Narrative   Lives with mom Research scientist (physical sciences)) and half-sister Lori Miles) who is also mentally impaired.  Has CAP assistance, urinary incontinence, needs help with feeding,dressing, toileting   Goes to [employer].  In the 9th grade.   Educational level: 11th grade School Attending: [employer] Living with:  mother and sibling  Hobbies/Interest: Lori Miles enjoys eating, dancing, bowling and movies. School comments:  Lori Miles does a little work when she wants to.  Allergies Allergies  Allergen Reactions  . Abilify [Aripiprazole] Swelling and Palpitations  . Seroquel [Quetiapine Fumarate] Palpitations  . Amoxicillin-Pot Clavulanate Hives  . Keppra [Levetiracetam] Other (See Comments)    Aggressive behavior     Physical Exam BP 94/66 mmHg  Pulse 90  Ht 5\' 11"  (1.803 m)  Wt 166 lb 9.6 oz (75.569 kg)  BMI 23.25 kg/m2 General: alert, well developed, obese with short stature, in no acute distress, black hair, brown eyes, right handed  Head: normocephalic, no dysmorphic features  Ears, Nose and Throat: Otoscopic: Tympanic membranes normal. Pharynx: oropharynx is pink without exudates or tonsillar hypertrophy.  Neck: supple, full range of motion, no cranial or cervical bruits  Respiratory: auscultation clear  Cardiovascular:  no murmurs, pulses are normal  Musculoskeletal: no skeletal deformities or apparent scoliosis  Skin: no rashes or neurocutaneous lesions  Neurologic Exam  Mental Status: alert; she is able to follow some commands. She is able to name objects. She is impulsive. Cranial Nerves: visual fields are full to double simultaneous stimuli; extraocular movements are full and conjugate; pupils are round reactive to light; funduscopic examination shows sharp disc margins with normal vessels; symmetric facial strength; midline tongue and uvula; air conduction is greater than bone conduction bilaterally.  Motor: Normal strength, tone, and mass; good fine motor movements; no pronator drift.  Sensory: intact responses to touch and temperature Coordination: good finger-to-nose, rapid repetitive alternating movements and finger apposition  Gait and Station: Gait is slightly broad-based but not unsteady; she turns her toes in when she walks; she is able to walk on heels, toes and tandem with mild clumsiness; balance is poor; Romberg exam is negative; Gower response is negative  Reflexes: symmetric and diminished bilaterally; no clonus; bilateral flexor plantar responses.  Impression 1. Localization related seizures with secondary generalization 2. Intellectual disability 3. Insomnia 4. Episodic recurrent urinary tract infections and urinary incontinence 5. Toeing-in   Recommendations for plan of care The patient's previous [facility] records were reviewed. Lori Miles has neither had nor required imaging or lab studies  since the last visit. She is a 19 year old girl with history of localization related seizures with secondary generalization, intellectual disabilities, episodic urinary tract infections, episodic urinary incontinence, obesity, toeing in, limited endurance when walking and insomnia. She has been seizure free on Depakote since her last visit. I commended her mother for limiting Lori Miles's portions and  snacks, and trying to help Veola be more active. We talked about ways to help Bassy be more active. I talked with her about Witney's tendency to turn her toes in when she walks, and her easy fatigability when walking. I will order PT and will include an for a wheelchair or stroller. Tamiyah will return for follow up in 6 months or sooner if needed.    The medication list was reviewed and reconciled.  No changes were made in the prescribed medications today.  A complete medication list was provided to the patient/caregiver.  Total time spent with the patient was 25 minutes, of which 50% or more was spent in counseling and coordination of care.

## 2014-08-31 MED ORDER — CLONIDINE HCL 0.1 MG PO TABS: ORAL_TABLET | ORAL | Status: DC

## 2014-08-31 MED ORDER — DEPAKOTE 500 MG PO TBEC: DELAYED_RELEASE_TABLET | ORAL | Status: DC

## 2014-08-31 MED ORDER — TRAZODONE HCL 50 MG PO TABS: ORAL_TABLET | ORAL | Status: DC

## 2014-08-31 NOTE — Patient Instructions (Signed)
Continue giving Mi's medication as you have been giving it.   I will send in referral to PT as we discussed.   Please plan to return for follow up in 6 months or sooner if needed.

## 2014-09-01 ENCOUNTER — Ambulatory Visit: Payer: Medicaid Other | Admitting: Physical Therapy

## 2014-09-22 ENCOUNTER — Ambulatory Visit: Payer: Medicare Other | Attending: Family | Admitting: Physical Therapy

## 2014-09-22 ENCOUNTER — Encounter: Payer: Self-pay | Admitting: Physical Therapy

## 2014-09-22 DIAGNOSIS — R269 Unspecified abnormalities of gait and mobility: Secondary | ICD-10-CM | POA: Insufficient documentation

## 2014-09-22 NOTE — Therapy (Signed)
[facility] [address] [place], [place], [address] Phone: [phone]   Fax:  [phone]  Physical Therapy Evaluation  Patient Details  Name: Lori SchickJasmine Miles MRN: [id number] Date of Birth: [birth year] Referring Provider:  Elveria RisingGoodpasture, Tina, NP  Encounter Date: 09/22/2014      PT End of Session - 09/22/14 2004    Visit Number 1   Number of Visits 1   Authorization Type Medicaid   PT Start Time 1536   PT Stop Time 1614   PT Time Calculation (min) 38 min      Past Medical History  Diagnosis Date  . CP (cerebral palsy)     mom denies  . Asthma   . Mental deficiency     hx of aggressive behavior,impaired speech   . Constipation   . Allergy   . Autism     mom states no actual dx, but admits she has some symptoms  . Frequency-urgency syndrome      some incontinence ; wears pull-ups  . Seizure     most recent sz " at the end of summer"  . Interstitial cystitis     Past Surgical History  Procedure Laterality Date  . Cystoscopy    . Cystoscopy with hydrodistension and biopsy  04/14/2012    Procedure: CYSTOSCOPY/BIOPSY/HYDRODISTENSION;  Surgeon: Lindaann SloughMarc-Henry Nesi, MD;  [facility];  Service: Urology;;  instillation of marcaine and pyridium    There were no vitals filed for this visit.  Visit Diagnosis:  Abnormality of gait - Plan: PT plan of care cert/re-cert      Subjective Assessment - 09/22/14 1955    Subjective Pt is accompanied by her mother who reports pt si agitated today; states that they went to another provider for PT but that she wants a second opinion on pt.'s needs to address walking problem   Patient is accompained by: Family member  mother   Pertinent History intellectual disability; epilepsy; seizures; Autism; toeing in   Patient Stated Goals mother states she wants a 2nd opinion on whether or not pt needs orthotics as recommended by another PT   Currently in Pain?  No/denies            [facility] PT Assessment - 09/22/14 0001    Assessment   Medical Diagnosis Gait abnormality; toeing in   Onset Date/Surgical Date --  about 1 year ago for decline in walking   Prior Therapy about 1 month ago at another facility   Precautions   Precautions Fall   Precaution Comments decr. safety awareness   Balance Screen   Has the patient fallen in the past 6 months Yes   How many times? 3   Has the patient had a decrease in activity level because of a fear of falling?  No   Is the patient reluctant to leave their home because of a fear of falling?  No   ROM / Strength   AROM / PROM / Strength AROM;Strength  unable to assess strength due to cognitive deficits   Transfers   Transfers Sit to Stand   Ambulation/Gait   Ambulation/Gait Yes   Ambulation Distance (Feet) 120 Feet   Assistive device None   Gait Pattern --  LLE toes in   Ambulation Surface Level;Indoor   Gait Comments feet are internally rotated with LLE >RLE  PT Education - 09/22/14 2003    Education provided Yes   Education Details recommended consult for custom orthotics due to lack of arch support in both feet; gave names and numbers for [employer]   Person(s) Educated Parent(s)   Methods Explanation   Comprehension Verbalized understanding                    Plan - 09/22/14 2005    Clinical Impression Statement Pt. would benefit from custom orthotics to assist and provide arch support in bil. feet; pt. is unable to follow directions due to cognitive deficits and was easily agitated and distracted in crowded gym in this facility; pt is scheduled for wheelchair evaluation, which mother reports she is very interested in obtaining   PT Frequency 1x / week  eval only   PT Next Visit Plan N/A - eval ony with plan to complete wheelchair eval with vendor in August   Consulted and Agree with Plan of Care Family  member/caregiver;Patient   Family Member Consulted mother         Problem List Patient Active Problem List   Diagnosis Date Noted  . Toeing-in 08/29/2014  . Obesity (BMI 30-39.9) 08/23/2013  . 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 Partial epilepsy with impairment of consciousness 09/28/2012  . 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 Interstitial cystitis 04/28/2012  . Intellectual disability 08/01/2011  . Seizure disorder 08/01/2011  . Recurrent urinary tract infection 08/01/2011  . Asthma 08/01/2011  . Chronic constipation 08/01/2011  . Contraception 08/01/2011    Kary Kos, PT 09/22/2014, 8:15 PM  [facility] [address] [place], [place], [address] Phone: [phone]   Fax:  [phone]

## 2014-10-15 ENCOUNTER — Emergency Department (HOSPITAL_COMMUNITY)
Admission: EM | Admit: 2014-10-15 | Discharge: 2014-10-15 | Disposition: A | Discharge status: Home / Self Care | Payer: Medicare Other | Attending: Emergency Medicine | Admitting: Emergency Medicine

## 2014-10-15 ENCOUNTER — Encounter (HOSPITAL_COMMUNITY): Payer: Self-pay | Admitting: *Deleted

## 2014-10-15 DIAGNOSIS — Z8669 Personal history of other diseases of the nervous system and sense organs: Secondary | ICD-10-CM | POA: Diagnosis not present

## 2014-10-15 DIAGNOSIS — X58XXXA Exposure to other specified factors, initial encounter: Secondary | ICD-10-CM | POA: Diagnosis not present

## 2014-10-15 DIAGNOSIS — Z87448 Personal history of other diseases of urinary system: Secondary | ICD-10-CM | POA: Insufficient documentation

## 2014-10-15 DIAGNOSIS — J45909 Unspecified asthma, uncomplicated: Secondary | ICD-10-CM | POA: Insufficient documentation

## 2014-10-15 DIAGNOSIS — S00412A Abrasion of left ear, initial encounter: Secondary | ICD-10-CM | POA: Diagnosis not present

## 2014-10-15 DIAGNOSIS — F840 Autistic disorder: Secondary | ICD-10-CM | POA: Insufficient documentation

## 2014-10-15 DIAGNOSIS — Y9289 Other specified places as the place of occurrence of the external cause: Secondary | ICD-10-CM | POA: Diagnosis not present

## 2014-10-15 DIAGNOSIS — T162XXA Foreign body in left ear, initial encounter: Secondary | ICD-10-CM | POA: Insufficient documentation

## 2014-10-15 DIAGNOSIS — Z7951 Long term (current) use of inhaled steroids: Secondary | ICD-10-CM | POA: Insufficient documentation

## 2014-10-15 DIAGNOSIS — Y998 Other external cause status: Secondary | ICD-10-CM | POA: Diagnosis not present

## 2014-10-15 DIAGNOSIS — Z79899 Other long term (current) drug therapy: Secondary | ICD-10-CM | POA: Insufficient documentation

## 2014-10-15 DIAGNOSIS — Z880 Allergy status to penicillin: Secondary | ICD-10-CM | POA: Diagnosis not present

## 2014-10-15 DIAGNOSIS — Z8719 Personal history of other diseases of the digestive system: Secondary | ICD-10-CM | POA: Insufficient documentation

## 2014-10-15 DIAGNOSIS — Y9389 Activity, other specified: Secondary | ICD-10-CM | POA: Diagnosis not present

## 2014-10-15 NOTE — ED Notes (Signed)
Pt family reports hair bead in left ear that was placed approx 1 hour ago. Visible at triage.

## 2014-10-15 NOTE — ED Notes (Signed)
J Hedges, PA, along w/staff members x 3 in w/pt.

## 2014-10-15 NOTE — Discharge Instructions (Signed)
Ear Foreign Body °An ear foreign body is an object that is stuck in the ear. It is common for young children to put objects into the ear canal. These may include pebbles, beads, beans, and any other small objects which will fit. In adults, objects such as cotton swabs may become lodged in the ear canal. In all ages, the most common foreign bodies are insects that enter the ear canal.  °SYMPTOMS  °Foreign bodies may cause pain, buzzing or roaring sounds, hearing loss, and ear drainage.  °HOME CARE INSTRUCTIONS  °· Keep all follow-up appointments with your caregiver as told. °· Keep small objects out of reach of young children. Tell them not to put anything in their ears. °SEEK IMMEDIATE MEDICAL CARE IF:  °· You have bleeding from the ear. °· You have increased pain or swelling of the ear. °· You have reduced hearing. °· You have discharge coming from the ear. °· You have a fever. °· You have a headache. °MAKE SURE YOU:  °· Understand these instructions. °· Will watch your condition. °· Will get help right away if you are not doing well or get worse. °Document Released: 03/01/2000 Document Revised: 05/27/2011 Document Reviewed: 10/21/2007 °[employer]® Patient Information ©2015 [employer]. This information is not intended to replace advice given to you by your health care provider. Make sure you discuss any questions you have with your health care provider. ° °

## 2014-10-15 NOTE — ED Notes (Signed)
Pt tolerated procedure well of removing bead from left ear.

## 2014-10-15 NOTE — ED Provider Notes (Signed)
History  This chart was scribed for non-physician practitioner, Lori Mechanic, PA-C,working with Lori Mocha, MD, by Lori Miles, ED Scribe. This patient was seen in room TR07C/TR07C and the patient's care was started at 2:17 PM.  Chief Complaint  Patient presents with  . Foreign Body in Ear   The history is provided by the patient, medical records and a caregiver. No language interpreter was used.    HPI Comments:    5 caveat due to CP  Lori Miles is a 19 y.o. female with PMHx of cerebral palsy who presents to the Emergency Department complaining of a foreign body to the left ear about two hours ago. Pt's care giver states a hair bead got lodged in the ear canal. She has not done anything to treat the symptoms. She denies modifying factors. Caregiver denies fever, chills, nausea, vomiting, bleeding or drainage from the ear.      Past Medical History  Diagnosis Date  . CP (cerebral palsy)     mom denies  . Asthma   . Mental deficiency     hx of aggressive behavior,impaired speech   . Constipation   . Allergy   . Autism     mom states no actual dx, but admits she has some symptoms  . Frequency-urgency syndrome      some incontinence ; wears pull-ups  . Seizure     most recent sz " at the end of summer"  . Interstitial cystitis    Past Surgical History  Procedure Laterality Date  . Cystoscopy    . Cystoscopy with hydrodistension and biopsy  04/14/2012    Procedure: CYSTOSCOPY/BIOPSY/HYDRODISTENSION;  Surgeon: Lindaann Slough, MD;  Location: [facility] [place];  Service: Urology;;  instillation of marcaine and pyridium   Family History  Problem Relation Age of Onset  . Seizures Mother   . Seizures Sister     1 sister has   History  Substance Use Topics  . Smoking status: Never Smoker   . Smokeless tobacco: Never Used  . Alcohol Use: No   OB History    No data available     Review of Systems  Unable to perform ROS   Allergies  Abilify;  Seroquel; Amoxicillin-pot clavulanate; and Keppra  Home Medications   Prior to Admission medications   Medication Sig Start Date End Date Taking? Authorizing Provider  albuterol (PROVENTIL) (2.5 MG/3ML) 0.083% nebulizer solution Take 2.5 mg by nebulization every 6 (six) hours as needed for wheezing or shortness of breath (wheezing).     Historical Provider, MD  budesonide (PULMICORT) 0.5 MG/2ML nebulizer solution Take 0.25 mg by nebulization 2 (two) times daily. Per Dr. Willa Rough, Allergist    Historical Provider, MD  cetirizine (ZYRTEC) 10 MG tablet Take 10 mg by mouth daily.  08/07/11   Historical Provider, MD  cloNIDine (CATAPRES) 0.1 MG tablet Take 1 tab by mouth at bedtime. 08/31/14   Elveria Rising, NP  Cranberry 500 MG CAPS Take 500 mg by mouth daily.    Historical Provider, MD  DEPAKOTE 500 MG DR tablet Take 1 tablet twice per day 08/31/14   Elveria Rising, NP  fluconazole (DIFLUCAN) 100 MG tablet Take 1 tablet (100 mg total) by mouth once. Repeat in 48-72 hours. 05/19/14   Brock Bad, MD  hyoscyamine (LEVSIN SL) 0.125 MG SL tablet DISSOLVE 1 TABLET UNDER THE TONGUE 2 TIMES A DAY 08/08/14   Historical Provider, MD  Levonorgestrel-Ethinyl Estradiol (SEASONIQUE) 0.15-0.03 &0.01 MG tablet Take 1 tablet by mouth  daily. Take 1 active tablet daily, skip non active pills. 10/27/13   Antionette Char, MD  Melatonin 3 MG TABS Take 3 mg by mouth at bedtime. For insomnia    Historical Provider, MD  montelukast (SINGULAIR) 5 MG chewable tablet Chew 5 mg by mouth daily.    Historical Provider, MD  Multiple Vitamin (MULTIVITAMIN WITH MINERALS) TABS tablet Take 1 tablet by mouth daily.    Historical Provider, MD  nitrofurantoin, macrocrystal-monohydrate, (MACROBID) 100 MG capsule Take 1 capsule (100 mg total) by mouth 2 (two) times daily. 07/18/14   Tatyana Kirichenko, PA-C  Olopatadine HCl 0.2 % SOLN Place 1 drop into both eyes daily.    Historical Provider, MD  ondansetron (ZOFRAN ODT) 8 MG  disintegrating tablet Take 1 tablet (8 mg total) by mouth every 8 (eight) hours as needed for nausea or vomiting. 07/18/14   Tatyana Kirichenko, PA-C  ondansetron (ZOFRAN) 4 MG tablet Take 1 tablet (4 mg total) by mouth every 6 (six) hours. Patient taking differently: Take 4 mg by mouth every 8 (eight) hours as needed.  06/01/14   Tiffany Neva Seat, PA-C  phenazopyridine (PYRIDIUM) 200 MG tablet Take 1 tablet (200 mg total) by mouth 3 (three) times daily. 07/18/14   Tatyana Kirichenko, PA-C  senna (SENOKOT) 8.6 MG TABS tablet Take 1 tablet (8.6 mg total) by mouth daily. 09/08/13 09/09/14  Jon Gills, MD  traZODone (DESYREL) 50 MG tablet Take 2 tabs by mouth at bedtime 08/31/14   Elveria Rising, NP  trimethoprim (TRIMPEX) 100 MG tablet Take 100 mg by mouth every morning.     Historical Provider, MD  vitamin C (ASCORBIC ACID) 500 MG tablet Take 500 mg by mouth daily.    Historical Provider, MD   Triage Vitals: BP 100/61 mmHg  Pulse 108  Temp(Src) 98.5 F (36.9 C) (Oral)  Resp 18  SpO2 99% Physical Exam  Constitutional: She is oriented to person, place, and time. She appears well-developed and well-nourished.  HENT:  Head: Normocephalic and atraumatic.  Clear bead in left ear. No signs of infection. After removal of the bead, minor superficial abrasion noted to external canal on posterior wall.  Eyes: EOM are normal.  Neck: Normal range of motion.  Pulmonary/Chest: Effort normal.  Musculoskeletal: Normal range of motion.  Neurological: She is alert and oriented to person, place, and time.  Skin: Skin is warm and dry.  Psychiatric: She has a normal mood and affect. Her behavior is normal.  Nursing note and vitals reviewed.   ED Course  Procedures (including critical care time) DIAGNOSTIC STUDIES: Oxygen Saturation is 99% on RA, normal by my interpretation.   COORDINATION OF CARE: 2:20 PM- Will remove foreign body. Pt verbalizes understanding and agrees to plan.  Medications - No data to  display  Labs Review Labs Reviewed - No data to display  Imaging Review No results found.   EKG Interpretation None      MDM   Final diagnoses:  Foreign body in ear, left, initial encounter      Labs: none  Imaging: none  Consults: none  Therapeutics: none  Discharge Meds: none  Assessment/Plan: She presents with foreign body in her left ear, removed without complication with the assistance of nursing staff. Minor abrasion to the inside of the ear, no signs of infection. Should return precautions given.   I personally performed the services described in this documentation, which was scribed in my presence. The recorded information has been reviewed and is accurate.  Lori Mechanic, PA-C 10/15/14 1611  Lori Mocha, MD 10/16/14 [phone]

## 2014-10-19 ENCOUNTER — Emergency Department (HOSPITAL_COMMUNITY)
Admission: EM | Admit: 2014-10-19 | Discharge: 2014-10-20 | Disposition: A | Discharge status: Home / Self Care | Payer: Medicare Other | Attending: Emergency Medicine | Admitting: Emergency Medicine

## 2014-10-19 ENCOUNTER — Encounter (HOSPITAL_COMMUNITY): Payer: Self-pay | Admitting: *Deleted

## 2014-10-19 ENCOUNTER — Emergency Department (HOSPITAL_COMMUNITY): Payer: Medicare Other

## 2014-10-19 DIAGNOSIS — R1110 Vomiting, unspecified: Secondary | ICD-10-CM | POA: Diagnosis not present

## 2014-10-19 DIAGNOSIS — H9201 Otalgia, right ear: Secondary | ICD-10-CM | POA: Insufficient documentation

## 2014-10-19 DIAGNOSIS — Z880 Allergy status to penicillin: Secondary | ICD-10-CM | POA: Insufficient documentation

## 2014-10-19 DIAGNOSIS — J029 Acute pharyngitis, unspecified: Secondary | ICD-10-CM | POA: Diagnosis not present

## 2014-10-19 DIAGNOSIS — N3010 Interstitial cystitis (chronic) without hematuria: Secondary | ICD-10-CM | POA: Insufficient documentation

## 2014-10-19 DIAGNOSIS — Z793 Long term (current) use of hormonal contraceptives: Secondary | ICD-10-CM | POA: Insufficient documentation

## 2014-10-19 DIAGNOSIS — G40909 Epilepsy, unspecified, not intractable, without status epilepticus: Secondary | ICD-10-CM | POA: Diagnosis not present

## 2014-10-19 DIAGNOSIS — Z79899 Other long term (current) drug therapy: Secondary | ICD-10-CM | POA: Diagnosis not present

## 2014-10-19 DIAGNOSIS — F840 Autistic disorder: Secondary | ICD-10-CM | POA: Diagnosis not present

## 2014-10-19 DIAGNOSIS — K5900 Constipation, unspecified: Secondary | ICD-10-CM | POA: Insufficient documentation

## 2014-10-19 DIAGNOSIS — Z792 Long term (current) use of antibiotics: Secondary | ICD-10-CM | POA: Insufficient documentation

## 2014-10-19 DIAGNOSIS — J45901 Unspecified asthma with (acute) exacerbation: Secondary | ICD-10-CM

## 2014-10-19 DIAGNOSIS — R062 Wheezing: Secondary | ICD-10-CM | POA: Diagnosis present

## 2014-10-19 DIAGNOSIS — Z7951 Long term (current) use of inhaled steroids: Secondary | ICD-10-CM | POA: Insufficient documentation

## 2014-10-19 DIAGNOSIS — R05 Cough: Secondary | ICD-10-CM

## 2014-10-19 DIAGNOSIS — R059 Cough, unspecified: Secondary | ICD-10-CM

## 2014-10-19 DIAGNOSIS — J40 Bronchitis, not specified as acute or chronic: Secondary | ICD-10-CM

## 2014-10-19 MED ORDER — PREDNISONE 20 MG PO TABS
60.0000 mg | ORAL_TABLET | Freq: Once | ORAL | Status: AC
  Administered 2014-10-19: 60 mg via ORAL
  Filled 2014-10-19: qty 3

## 2014-10-19 NOTE — ED Notes (Signed)
MD at bedside. 

## 2014-10-19 NOTE — ED Notes (Signed)
The pt is autistic.  She had a bead caught in her lt ear Saturday.  Since then the pt has been c/o lt ear pain and swelling to the lt side of her face. She has been c/o her throat hurting.  She has been seen x 2

## 2014-10-19 NOTE — ED Provider Notes (Signed)
CSN: [id number]     Arrival date & time 10/19/14  2219 History   This chart was scribed for Lori Divine, MD by Arlan Organ, ED Scribe. This patient was seen in room B14C/B14C and the patient's care was started 11:09 PM.   Chief Complaint  Patient presents with  . Otalgia  . Asthma   The history is provided by a parent. No language interpreter was used.    HPI Comments: Lori Miles, here with her Mother is a 19 y.o. female with a PMHx of asthma who presents to the Emergency Department complaining of constant, ongoing, unchanged R sided otalgia x 4 days. No recent injury or trauma. However, pt was last evaluated Saturday 7/30 for same after a bead was lodged in her ear canal. Mother also reports swelling to the ear along with a sore throat, cough, postussive emesis, wheezing. At home nebulizers along with antiseptic sprays attempted prior to arrival without any improvement for symptoms. Mother states most symptoms are consistent with what she typically experiences with asthma exacerbations. Last bowel movement this evening prior to arrival.  Past Medical History  Diagnosis Date  . CP (cerebral palsy)     mom denies  . Asthma   . Mental deficiency     hx of aggressive behavior,impaired speech   . Constipation   . Allergy   . Autism     mom states no actual dx, but admits she has some symptoms  . Frequency-urgency syndrome      some incontinence ; wears pull-ups  . Seizure     most recent sz " at the end of summer"  . Interstitial cystitis    Past Surgical History  Procedure Laterality Date  . Cystoscopy    . Cystoscopy with hydrodistension and biopsy  04/14/2012    Procedure: CYSTOSCOPY/BIOPSY/HYDRODISTENSION;  Surgeon: Lindaann Slough, MD;  Location: [facility] [place];  Service: Urology;;  instillation of marcaine and pyridium   Family History  Problem Relation Age of Onset  . Seizures Mother   . Seizures Sister     1 sister has   History  Substance Use Topics   . Smoking status: Never Smoker   . Smokeless tobacco: Never Used  . Alcohol Use: No   OB History    No data available     Review of Systems  Unable to perform ROS: Patient nonverbal      Allergies  Abilify; Seroquel; Amoxicillin-pot clavulanate; and Keppra  Home Medications   Prior to Admission medications   Medication Sig Start Date End Date Taking? Authorizing Provider  albuterol (PROVENTIL) (2.5 MG/3ML) 0.083% nebulizer solution Take 2.5 mg by nebulization every 6 (six) hours as needed for wheezing or shortness of breath (wheezing).     Historical Provider, MD  budesonide (PULMICORT) 0.5 MG/2ML nebulizer solution Take 0.25 mg by nebulization 2 (two) times daily. Per Dr. Willa Rough, Allergist    Historical Provider, MD  cetirizine (ZYRTEC) 10 MG tablet Take 10 mg by mouth daily.  08/07/11   Historical Provider, MD  cloNIDine (CATAPRES) 0.1 MG tablet Take 1 tab by mouth at bedtime. 08/31/14   Elveria Rising, NP  Cranberry 500 MG CAPS Take 500 mg by mouth daily.    Historical Provider, MD  DEPAKOTE 500 MG DR tablet Take 1 tablet twice per day 08/31/14   Elveria Rising, NP  fluconazole (DIFLUCAN) 100 MG tablet Take 1 tablet (100 mg total) by mouth once. Repeat in 48-72 hours. 05/19/14   Brock Bad, MD  hyoscyamine (LEVSIN SL) 0.125 MG SL tablet DISSOLVE 1 TABLET UNDER THE TONGUE 2 TIMES A DAY 08/08/14   Historical Provider, MD  Levonorgestrel-Ethinyl Estradiol (SEASONIQUE) 0.15-0.03 &0.01 MG tablet Take 1 tablet by mouth daily. Take 1 active tablet daily, skip non active pills. 10/27/13   Antionette Char, MD  Melatonin 3 MG TABS Take 3 mg by mouth at bedtime. For insomnia    Historical Provider, MD  montelukast (SINGULAIR) 5 MG chewable tablet Chew 5 mg by mouth daily.    Historical Provider, MD  Multiple Vitamin (MULTIVITAMIN WITH MINERALS) TABS tablet Take 1 tablet by mouth daily.    Historical Provider, MD  nitrofurantoin, macrocrystal-monohydrate, (MACROBID) 100 MG capsule Take  1 capsule (100 mg total) by mouth 2 (two) times daily. 07/18/14   Tatyana Kirichenko, PA-C  Olopatadine HCl 0.2 % SOLN Place 1 drop into both eyes daily.    Historical Provider, MD  ondansetron (ZOFRAN ODT) 8 MG disintegrating tablet Take 1 tablet (8 mg total) by mouth every 8 (eight) hours as needed for nausea or vomiting. 07/18/14   Tatyana Kirichenko, PA-C  ondansetron (ZOFRAN) 4 MG tablet Take 1 tablet (4 mg total) by mouth every 6 (six) hours. Patient taking differently: Take 4 mg by mouth every 8 (eight) hours as needed.  06/01/14   Tiffany Neva Seat, PA-C  phenazopyridine (PYRIDIUM) 200 MG tablet Take 1 tablet (200 mg total) by mouth 3 (three) times daily. 07/18/14   Tatyana Kirichenko, PA-C  senna (SENOKOT) 8.6 MG TABS tablet Take 1 tablet (8.6 mg total) by mouth daily. 09/08/13 09/09/14  Jon Gills, MD  traZODone (DESYREL) 50 MG tablet Take 2 tabs by mouth at bedtime 08/31/14   Elveria Rising, NP  trimethoprim (TRIMPEX) 100 MG tablet Take 100 mg by mouth every morning.     Historical Provider, MD  vitamin C (ASCORBIC ACID) 500 MG tablet Take 500 mg by mouth daily.    Historical Provider, MD   Triage Vitals: BP 105/66 mmHg  Pulse 98  Temp(Src) 98.1 F (36.7 C) (Oral)  Resp 18  Ht 4\' 9"  (1.448 m)  Wt 170 lb 2 oz (77.168 kg)  BMI 36.80 kg/m2  SpO2 100%   Physical Exam  Constitutional: She appears well-developed and well-nourished. No distress.  HENT:  Head: Normocephalic and atraumatic.  Right Ear: Hearing, tympanic membrane and ear canal normal.  Left Ear: Hearing, tympanic membrane and ear canal normal.  Mouth/Throat: Oropharynx is clear and moist. No oropharyngeal exudate, posterior oropharyngeal edema, posterior oropharyngeal erythema or tonsillar abscesses.  Eyes: Conjunctivae are normal. Pupils are equal, round, and reactive to light. No scleral icterus.  Neck: Neck supple.  Cardiovascular: Normal rate, regular rhythm, normal heart sounds and intact distal pulses.   No murmur  heard. Pulmonary/Chest: Effort normal. No stridor. No respiratory distress. She has decreased breath sounds (mild decreased air movement. ). She has no rales.  Abdominal: Soft. Bowel sounds are normal. She exhibits no distension. There is no tenderness.  Musculoskeletal: Normal range of motion.  Neurological: She is alert.  Skin: Skin is warm and dry. No rash noted.  Psychiatric: She has a normal mood and affect. Her behavior is normal.  Nursing note and vitals reviewed.   ED Course  Procedures (including critical care time)  DIAGNOSTIC STUDIES: Oxygen Saturation is 100% on RA, Normal by my interpretation.    COORDINATION OF CARE: 11:16 PM- Will order CXR. Will send home with a prescription for Prednisone. Discussed treatment plan with pt at bedside and pt  agreed to plan.     Labs Review Labs Reviewed - No data to display  Imaging Review Dg Chest 2 View  10/20/2014   CLINICAL DATA:  Cough, vomiting and shortness of breath for 3 days. History of asthma, seizures.  EXAM: CHEST  2 VIEW  COMPARISON:  Chest radiograph May 31, 2014  FINDINGS: Cardiomediastinal silhouette is unremarkable. Low inspiratory examination. Mild bronchitic changes. The lungs are clear without pleural effusions or focal consolidations. Trachea projects midline and there is no pneumothorax. Soft tissue planes and included osseous structures are non-suspicious.  IMPRESSION: Mild bronchitic changes without focal consolidation.   Electronically Signed   By: Awilda Metro M.D.   On: 10/20/2014 00:29     EKG Interpretation None      MDM   Final diagnoses:  Cough  Bronchitis  Asthma, unspecified asthma severity, with acute exacerbation    19 yo female with hx of CP presenting with cough, wheezing.  Mother says this is typical of her asthma exacerbations.  She does have some decreased air movement, no respiratory distress.  Seemed to improve after albuterol.  Given prednisone.  CXR with bronchitic changes.  Due  to medical history/comorbidities, will treat with azithromycin.  Mother in agreement with plan.   I personally performed the services described in this documentation, which was scribed in my presence. The recorded information has been reviewed and is accurate.     Lori Divine, MD 10/20/14 [phone]

## 2014-10-19 NOTE — ED Notes (Signed)
Patient transported to X-ray 

## 2014-10-20 ENCOUNTER — Encounter: Payer: Self-pay | Admitting: Physical Therapy

## 2014-10-20 ENCOUNTER — Ambulatory Visit: Payer: Medicare Other | Attending: Family | Admitting: Physical Therapy

## 2014-10-20 DIAGNOSIS — R262 Difficulty in walking, not elsewhere classified: Secondary | ICD-10-CM | POA: Diagnosis present

## 2014-10-20 DIAGNOSIS — J45901 Unspecified asthma with (acute) exacerbation: Secondary | ICD-10-CM | POA: Diagnosis not present

## 2014-10-20 MED ORDER — AZITHROMYCIN 250 MG PO TABS: 250.0000 mg | ORAL_TABLET | Freq: Every day | ORAL | Status: DC

## 2014-10-20 MED ORDER — ALBUTEROL SULFATE (2.5 MG/3ML) 0.083% IN NEBU
2.5000 mg | INHALATION_SOLUTION | Freq: Once | RESPIRATORY_TRACT | Status: AC
  Administered 2014-10-20: 2.5 mg via RESPIRATORY_TRACT
  Filled 2014-10-20: qty 3

## 2014-10-20 MED ORDER — PREDNISONE 10 MG PO TABS: 50.0000 mg | ORAL_TABLET | Freq: Every day | ORAL | Status: DC

## 2014-10-20 NOTE — Therapy (Signed)
[facility] [address] [place], [place], [address] Phone: [phone]   Fax:  [phone]  Physical Therapy Evaluation  Patient Details  Name: Lori Miles MRN: [id number] Date of Birth: 20-Jul-1995 Referring Provider:  Fleet Contras, MD  Encounter Date: 10/20/2014      PT End of Session - 10/20/14 2248    Visit Number 1  G1   Number of Visits 8   Date for PT Re-Evaluation 11/20/14   Authorization Type Medicare/Medicaid   Authorization Time Period 11-20-14 - 12-19-14   PT Start Time 1450   PT Stop Time 1536   PT Time Calculation (min) 46 min      Past Medical History  Diagnosis Date  . CP (cerebral palsy)     mom denies  . Asthma   . Mental deficiency     hx of aggressive behavior,impaired speech   . Constipation   . Allergy   . Autism     mom states no actual dx, but admits she has some symptoms  . Frequency-urgency syndrome      some incontinence ; wears pull-ups  . Seizure     most recent sz " at the end of summer"  . Interstitial cystitis     Past Surgical History  Procedure Laterality Date  . Cystoscopy    . Cystoscopy with hydrodistension and biopsy  04/14/2012    Procedure: CYSTOSCOPY/BIOPSY/HYDRODISTENSION;  Surgeon: Lindaann Slough, MD;  Location: [facility];  Service: Urology;;  instillation of marcaine and pyridium    There were no vitals filed for this visit.  Visit Diagnosis:  Difficulty walking - Plan: PT plan of care cert/re-cert      Subjective Assessment - 10/20/14 1547    Subjective Pt. is accompanied by her mother to wheelchair evaluation today - mother states she is doing much better today compared to status at time of appt. a month ago due to her having a UTI at that time:  pt.'s mother states that she desperately needs a wheelchair for pt to  access community requiring long distance ambulation; mother reports that pt. begins to c/o pain in both elgs after  ambulating approx. 1 block and that her knees will slowly give way and she will sit down at that time, refusing/unable??  to amb. further distances      Patient is accompained by: Family member  mother   Pertinent History intellectual disability; epilepsy; seizures; Autism; toeing in   Patient Stated Goals obtain manual wheelchair; trial of some PT sessions to address strengthening of legs for safety with long distance ambulation   Currently in Pain? No/denies            [facility] PT Assessment - 10/20/14 0001    Assessment   Medical Diagnosis Gait abnormality; toeing in; Seizures   Onset Date/Surgical Date --  approx. 1 year ago for decline in mobility   Prior Therapy about 1 month ago at another facility   Precautions   Precautions Fall   Precaution Comments decr. safety awareness   Balance Screen   Has the patient fallen in the past 6 months Yes   How many times? 3   Has the patient had a decrease in activity level because of a fear of falling?  No   Is the patient reluctant to leave their home because of a fear of falling?  No   ROM / Strength   AROM / PROM / Strength AROM   AROM  Overall AROM  Within functional limits for tasks performed   Strength   Overall Strength Unable to assess;Due to impaired cognition   Ambulation/Gait   Ambulation/Gait Yes   Ambulation Distance (Feet) 100 Feet   Assistive device None   Gait Pattern --  LLE toes in   Ambulation Surface Level;Indoor   Gait Comments feet are internally rotated with LLE >RLE                                PT Long Term Goals - 2014/11/07 2255    PT LONG TERM GOAL #1   Title Pt. will amb. 1000' without c/o legs giving out or pt sitting down. (11-20-14)   Time 4   Period Weeks   Status New   PT LONG TERM GOAL #2   Title Complete wheelchair eval and LMN to obtain wheelchair from [facility] for community mobility. (11-20-14)   Time 4   Period Weeks   Status New   PT LONG TERM GOAL #3   Title Mother  will verbalize understanding of HEP to assist with LE strengthening.  (11-20-14)   Time 4   Period Weeks   Status New   PT LONG TERM GOAL #4   Title Pt. will perform 10" nonstop on bike to demo improved endurance/activity tolerance.  (11-20-14)   Time 4   Period Weeks   Status New               Plan - 11/07/14 Aug 11, 2248    Clinical Impression Statement Pt. would benefit from a manual wheelchair for safety with community ambulation and also due to behavioral issues with decr. safety awareness; Josh Cadle, ATP from [facility] present for wheelchair evaluation; pt's mother also wishes to trial PT to try to incr. strenth in bil. LE's to prevent falls   Pt will benefit from skilled therapeutic intervention in order to improve on the following deficits Decreased cognition;Decreased mobility;Decreased strength;Difficulty walking;Decreased safety awareness   Rehab Potential Good   PT Frequency 2x / week   PT Duration 4 weeks   PT Treatment/Interventions ADLs/Self Care Home Management;Functional mobility training;Stair training;Gait training;DME Instruction;Therapeutic activities;Therapeutic exercise;Balance training;Neuromuscular re-education;Patient/family education   PT Next Visit Plan functional strengthening for bil. LE's   PT Home Exercise Plan see above   Consulted and Agree with Plan of Care Family member/caregiver;Patient   Family Member Consulted mother          G-Codes - 2014-11-07 Aug 11, 2299    Functional Assessment Tool Used difficulty amb. prolonged distances resulting in falls per mother's report   Functional Limitation Mobility: Walking and moving around   Mobility: Walking and Moving Around Current Status [phone]) At least 20 percent but less than 40 percent impaired, limited or restricted   Mobility: Walking and Moving Around Goal Status [phone]) At least 1 percent but less than 20 percent impaired, limited or restricted       Problem List Patient Active Problem List   Diagnosis Date  Noted  . Toeing-in 08/29/2014  . Obesity (BMI 30-39.9) 08/23/2013  . 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 Partial epilepsy with impairment of consciousness 09/28/2012  . 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 Interstitial cystitis 04/28/2012  . Intellectual disability 08/01/2011  . Seizure disorder 08/01/2011  . Recurrent urinary tract infection 08/01/2011  . Asthma 08/01/2011  . Chronic constipation 08/01/2011  . Contraception 08/01/2011    Kellie Chisolm, Donavan Burnet, PT  10/20/2014, 11:06 PM  [facility] [address] [place], [place], [address] Phone: [phone]   Fax:  [phone]

## 2014-10-20 NOTE — Discharge Instructions (Signed)

## 2014-10-20 NOTE — ED Notes (Signed)
Pt ambulating independently w/ steady gait on d/c in no acute distress, A&Ox4. D/c instructions reviewed w/ pt and family - pt and family deny any further questions or concerns at present. Rx given x2  

## 2014-10-24 ENCOUNTER — Emergency Department (HOSPITAL_COMMUNITY)
Admission: EM | Admit: 2014-10-24 | Discharge: 2014-10-24 | Disposition: A | Discharge status: Home / Self Care | Payer: Medicare Other | Attending: Emergency Medicine | Admitting: Emergency Medicine

## 2014-10-24 ENCOUNTER — Encounter (HOSPITAL_COMMUNITY): Payer: Self-pay | Admitting: Emergency Medicine

## 2014-10-24 ENCOUNTER — Emergency Department (HOSPITAL_COMMUNITY): Payer: Medicare Other

## 2014-10-24 DIAGNOSIS — G40909 Epilepsy, unspecified, not intractable, without status epilepticus: Secondary | ICD-10-CM | POA: Insufficient documentation

## 2014-10-24 DIAGNOSIS — J069 Acute upper respiratory infection, unspecified: Secondary | ICD-10-CM | POA: Diagnosis not present

## 2014-10-24 DIAGNOSIS — Z7952 Long term (current) use of systemic steroids: Secondary | ICD-10-CM | POA: Diagnosis not present

## 2014-10-24 DIAGNOSIS — Z880 Allergy status to penicillin: Secondary | ICD-10-CM | POA: Insufficient documentation

## 2014-10-24 DIAGNOSIS — Z79899 Other long term (current) drug therapy: Secondary | ICD-10-CM | POA: Diagnosis not present

## 2014-10-24 DIAGNOSIS — R0602 Shortness of breath: Secondary | ICD-10-CM | POA: Diagnosis present

## 2014-10-24 DIAGNOSIS — Z87448 Personal history of other diseases of urinary system: Secondary | ICD-10-CM | POA: Insufficient documentation

## 2014-10-24 DIAGNOSIS — F79 Unspecified intellectual disabilities: Secondary | ICD-10-CM | POA: Insufficient documentation

## 2014-10-24 DIAGNOSIS — F840 Autistic disorder: Secondary | ICD-10-CM | POA: Diagnosis not present

## 2014-10-24 DIAGNOSIS — K5900 Constipation, unspecified: Secondary | ICD-10-CM | POA: Insufficient documentation

## 2014-10-24 DIAGNOSIS — J45901 Unspecified asthma with (acute) exacerbation: Secondary | ICD-10-CM | POA: Insufficient documentation

## 2014-10-24 LAB — URINALYSIS, ROUTINE W REFLEX MICROSCOPIC
Glucose, UA: NEGATIVE mg/dL
HGB URINE DIPSTICK: NEGATIVE
Ketones, ur: 15 mg/dL — AB
LEUKOCYTES UA: NEGATIVE
NITRITE: NEGATIVE
Protein, ur: NEGATIVE mg/dL
Specific Gravity, Urine: 1.037 — ABNORMAL HIGH (ref 1.005–1.030)
Urobilinogen, UA: 1 mg/dL (ref 0.0–1.0)
pH: 6.5 (ref 5.0–8.0)

## 2014-10-24 LAB — BRAIN NATRIURETIC PEPTIDE: B NATRIURETIC PEPTIDE 5: 16.1 pg/mL (ref 0.0–100.0)

## 2014-10-24 LAB — RAPID STREP SCREEN (MED CTR MEBANE ONLY): Streptococcus, Group A Screen (Direct): NEGATIVE

## 2014-10-24 MED ORDER — IPRATROPIUM-ALBUTEROL 0.5-2.5 (3) MG/3ML IN SOLN
3.0000 mL | Freq: Four times a day (QID) | RESPIRATORY_TRACT | Status: DC
  Administered 2014-10-24: 3 mL via RESPIRATORY_TRACT
  Filled 2014-10-24: qty 3

## 2014-10-24 MED ORDER — DEXAMETHASONE 4 MG PO TABS
12.0000 mg | ORAL_TABLET | Freq: Once | ORAL | Status: AC
  Administered 2014-10-24: 12 mg via ORAL
  Filled 2014-10-24: qty 3

## 2014-10-24 NOTE — ED Notes (Signed)
PT ambulated with baseline gait; VSS; A&Ox3; no signs of distress; respirations even and unlabored; skin warm and dry; no questions upon discharge.  

## 2014-10-24 NOTE — ED Notes (Signed)
MD at bedside. 

## 2014-10-24 NOTE — ED Provider Notes (Signed)
CSN: [id number]     Arrival date & time 10/24/14  0416 History   First MD Initiated Contact with Patient 10/24/14 [phone]     Chief Complaint  Patient presents with  . Shortness of Breath     (Consider location/radiation/quality/duration/timing/severity/associated sxs/prior Treatment) HPI Comments: Patient here with shortness of breath and cough. She is recent diagnosis of bronchitis and with history as well as put on azithromycin and prednisone. She did have some improvement, but after treatment stop she has gotten worse over the past couple days. No fevers. She has having no respiratory distress right now.  Patient is a 19 y.o. female presenting with shortness of breath. The history is provided by the patient.  Shortness of Breath Severity:  Mild Duration:  2 days Timing:  Constant Progression:  Unchanged Chronicity:  Recurrent Context: URI (recent diagnosis of bronchitis)   Relieved by: inhalers, steroids, antibiotics. Worsened by:  Nothing tried Associated symptoms: cough   Associated symptoms: no fever     Past Medical History  Diagnosis Date  . CP (cerebral palsy)     mom denies  . Asthma   . Mental deficiency     hx of aggressive behavior,impaired speech   . Constipation   . Allergy   . Autism     mom states no actual dx, but admits she has some symptoms  . Frequency-urgency syndrome      some incontinence ; wears pull-ups  . Seizure     most recent sz " at the end of summer"  . Interstitial cystitis    Past Surgical History  Procedure Laterality Date  . Cystoscopy    . Cystoscopy with hydrodistension and biopsy  04/14/2012    Procedure: CYSTOSCOPY/BIOPSY/HYDRODISTENSION;  Surgeon: Lindaann Slough, MD;  Location: [facility];  Service: Urology;;  instillation of marcaine and pyridium   Family History  Problem Relation Age of Onset  . Seizures Mother   . Seizures Sister     1 sister has   History  Substance Use Topics  . Smoking status: Never  Smoker   . Smokeless tobacco: Never Used  . Alcohol Use: No   OB History    No data available     Review of Systems  Constitutional: Negative for fever.  Respiratory: Positive for cough. Negative for shortness of breath.   All other systems reviewed and are negative.     Allergies  Abilify; Seroquel; Amoxicillin-pot clavulanate; and Keppra  Home Medications   Prior to Admission medications   Medication Sig Start Date End Date Taking? Authorizing Provider  albuterol (PROVENTIL) (2.5 MG/3ML) 0.083% nebulizer solution Take 2.5 mg by nebulization every 6 (six) hours as needed for wheezing or shortness of breath (wheezing).     Historical Provider, MD  azithromycin (ZITHROMAX) 250 MG tablet Take 1 tablet (250 mg total) by mouth daily. Take first 2 tablets together, then 1 every day until finished. 10/20/14   [name], MD  budesonide (PULMICORT) 0.5 MG/2ML nebulizer solution Take 0.25 mg by nebulization 2 (two) times daily. Per Dr. Willa Rough, Allergist    Historical Provider, MD  cetirizine (ZYRTEC) 10 MG tablet Take 10 mg by mouth daily.  08/07/11   Historical Provider, MD  cloNIDine (CATAPRES) 0.1 MG tablet Take 1 tab by mouth at bedtime. 08/31/14   Elveria Rising, NP  Cranberry 500 MG CAPS Take 500 mg by mouth daily.    Historical Provider, MD  DEPAKOTE 500 MG DR tablet Take 1 tablet twice per day 08/31/14  Elveria Rising, NP  fluconazole (DIFLUCAN) 100 MG tablet Take 1 tablet (100 mg total) by mouth once. Repeat in 48-72 hours. Patient not taking: Reported on 10/20/2014 05/19/14   Brock Bad, MD  hyoscyamine (LEVSIN SL) 0.125 MG SL tablet DISSOLVE 1 TABLET UNDER THE TONGUE 2 TIMES A DAY 08/08/14   Historical Provider, MD  Levonorgestrel-Ethinyl Estradiol (SEASONIQUE) 0.15-0.03 &0.01 MG tablet Take 1 tablet by mouth daily. Take 1 active tablet daily, skip non active pills. 10/27/13   Antionette Char, MD  Melatonin 3 MG TABS Take 3 mg by mouth at bedtime. For insomnia    Historical  Provider, MD  montelukast (SINGULAIR) 5 MG chewable tablet Chew 5 mg by mouth daily.    Historical Provider, MD  Multiple Vitamin (MULTIVITAMIN WITH MINERALS) TABS tablet Take 1 tablet by mouth daily.    Historical Provider, MD  nitrofurantoin, macrocrystal-monohydrate, (MACROBID) 100 MG capsule Take 1 capsule (100 mg total) by mouth 2 (two) times daily. Patient not taking: Reported on 10/20/2014 07/18/14   Jaynie Crumble, PA-C  Olopatadine HCl 0.2 % SOLN Place 1 drop into both eyes daily.    Historical Provider, MD  ondansetron (ZOFRAN ODT) 8 MG disintegrating tablet Take 1 tablet (8 mg total) by mouth every 8 (eight) hours as needed for nausea or vomiting. 07/18/14   Tatyana Kirichenko, PA-C  ondansetron (ZOFRAN) 4 MG tablet Take 1 tablet (4 mg total) by mouth every 6 (six) hours. Patient taking differently: Take 4 mg by mouth every 8 (eight) hours as needed.  06/01/14   Tiffany Neva Seat, PA-C  phenazopyridine (PYRIDIUM) 200 MG tablet Take 1 tablet (200 mg total) by mouth 3 (three) times daily. 07/18/14   Tatyana Kirichenko, PA-C  predniSONE (DELTASONE) 10 MG tablet Take 5 tablets (50 mg total) by mouth daily. 10/20/14   [name], MD  senna (SENOKOT) 8.6 MG TABS tablet Take 1 tablet (8.6 mg total) by mouth daily. 09/08/13 10/20/14  Jon Gills, MD  traZODone (DESYREL) 50 MG tablet Take 2 tabs by mouth at bedtime 08/31/14   Elveria Rising, NP  trimethoprim (TRIMPEX) 100 MG tablet Take 100 mg by mouth every morning.     Historical Provider, MD  vitamin C (ASCORBIC ACID) 500 MG tablet Take 500 mg by mouth daily.    Historical Provider, MD   BP 115/71 mmHg  Pulse 93  Temp(Src) 98.8 F (37.1 C) (Oral)  Resp 21  SpO2 100% Physical Exam  Constitutional: She is oriented to person, place, and time. She appears well-developed and well-nourished. No distress.  HENT:  Head: Normocephalic and atraumatic.  Mouth/Throat: Oropharynx is clear and moist.  Eyes: EOM are normal. Pupils are equal, round, and  reactive to light.  Neck: Normal range of motion. Neck supple.  Cardiovascular: Normal rate and regular rhythm.  Exam reveals no friction rub.   No murmur heard. Pulmonary/Chest: Effort normal. No respiratory distress. She has wheezes (mild, end-expiratory). She has no rales.  To finish lung sounds diffusely with very mild end-expiratory wheezes  Abdominal: Soft. She exhibits no distension. There is no tenderness. There is no rebound.  Musculoskeletal: Normal range of motion. She exhibits no edema.  Neurological: She is alert and oriented to person, place, and time.  Skin: She is not diaphoretic.  Nursing note and vitals reviewed.   ED Course  Procedures (including critical care time) Labs Review Labs Reviewed - No data to display  Imaging Review No results found.   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 She was recently diagnosed bronchitis and given an anti-biotic prescription and sterilely. She had some transient improvement while on the medications, but since they stopped she has gotten mildly worse. Mom is concerned she is having shortness of breath. Chest x-ray ordered because she's having diminished breath sounds diffusely with some mild end expiratory wheezes. Chest x-ray negative for pneumonia, however shows some interstitial fluid. Unclear reason for this fluid. No other prior chest x-rays discuss fluid. BNP sent, if negative will plan on discharge.    [name], MD 10/25/14 [phone]

## 2014-10-24 NOTE — Discharge Instructions (Signed)
Upper Respiratory Infection, Adult An upper respiratory infection (URI) is also sometimes known as the common cold. The upper respiratory tract includes the nose, sinuses, throat, trachea, and bronchi. Bronchi are the airways leading to the lungs. Most people improve within 1 week, but symptoms can last up to 2 weeks. A residual cough may last even longer.  CAUSES Many different viruses can infect the tissues lining the upper respiratory tract. The tissues become irritated and inflamed and often become very moist. Mucus production is also common. A cold is contagious. You can easily spread the virus to others by oral contact. This includes kissing, sharing a glass, coughing, or sneezing. Touching your mouth or nose and then touching a surface, which is then touched by another person, can also spread the virus. SYMPTOMS  Symptoms typically develop 1 to 3 days after you come in contact with a cold virus. Symptoms vary from person to person. They may include:  Runny nose.  Sneezing.  Nasal congestion.  Sinus irritation.  Sore throat.  Loss of voice (laryngitis).  Cough.  Fatigue.  Muscle aches.  Loss of appetite.  Headache.  Low-grade fever. DIAGNOSIS  You might diagnose your own cold based on familiar symptoms, since most people get a cold 2 to 3 times a year. Your caregiver can confirm this based on your exam. Most importantly, your caregiver can check that your symptoms are not due to another disease such as strep throat, sinusitis, pneumonia, asthma, or epiglottitis. Blood tests, throat tests, and X-rays are not necessary to diagnose a common cold, but they may sometimes be helpful in excluding other more serious diseases. Your caregiver will decide if any further tests are required. RISKS AND COMPLICATIONS  You may be at risk for a more severe case of the common cold if you smoke cigarettes, have chronic heart disease (such as heart failure) or lung disease (such as asthma), or if  you have a weakened immune system. The very young and very old are also at risk for more serious infections. Bacterial sinusitis, middle ear infections, and bacterial pneumonia can complicate the common cold. The common cold can worsen asthma and chronic obstructive pulmonary disease (COPD). Sometimes, these complications can require emergency medical care and may be life-threatening. PREVENTION  The best way to protect against getting a cold is to practice good hygiene. Avoid oral or hand contact with people with cold symptoms. Wash your hands often if contact occurs. There is no clear evidence that vitamin C, vitamin E, echinacea, or exercise reduces the chance of developing a cold. However, it is always recommended to get plenty of rest and practice good nutrition. TREATMENT  Treatment is directed at relieving symptoms. There is no cure. Antibiotics are not effective, because the infection is caused by a virus, not by bacteria. Treatment may include:  Increased fluid intake. Sports drinks offer valuable electrolytes, sugars, and fluids.  Breathing heated mist or steam (vaporizer or shower).  Eating chicken soup or other clear broths, and maintaining good nutrition.  Getting plenty of rest.  Using gargles or lozenges for comfort.  Controlling fevers with ibuprofen or acetaminophen as directed by your caregiver.  Increasing usage of your inhaler if you have asthma. Zinc gel and zinc lozenges, taken in the first 24 hours of the common cold, can shorten the duration and lessen the severity of symptoms. Pain medicines may help with fever, muscle aches, and throat pain. A variety of non-prescription medicines are available to treat congestion and runny nose. Your caregiver   can make recommendations and may suggest nasal or lung inhalers for other symptoms.  HOME CARE INSTRUCTIONS   Only take over-the-counter or prescription medicines for pain, discomfort, or fever as directed by your  caregiver.  Use a warm mist humidifier or inhale steam from a shower to increase air moisture. This may keep secretions moist and make it easier to breathe.  Drink enough water and fluids to keep your urine clear or pale yellow.  Rest as needed.  Return to work when your temperature has returned to normal or as your caregiver advises. You may need to stay home longer to avoid infecting others. You can also use a face mask and careful hand washing to prevent spread of the virus. SEEK MEDICAL CARE IF:   After the first few days, you feel you are getting worse rather than better.  You need your caregiver's advice about medicines to control symptoms.  You develop chills, worsening shortness of breath, or brown or red sputum. These may be signs of pneumonia.  You develop yellow or brown nasal discharge or pain in the face, especially when you bend forward. These may be signs of sinusitis.  You develop a fever, swollen neck glands, pain with swallowing, or white areas in the back of your throat. These may be signs of strep throat. SEEK IMMEDIATE MEDICAL CARE IF:   You have a fever.  You develop severe or persistent headache, ear pain, sinus pain, or chest pain.  You develop wheezing, a prolonged cough, cough up blood, or have a change in your usual mucus (if you have chronic lung disease).  You develop sore muscles or a stiff neck. Document Released: 08/28/2000 Document Revised: 05/27/2011 Document Reviewed: 06/09/2013 Exit[employer] Patient Information 2015 [employer]. This information is not intended to replace advice given to you by your health care provider. Make sure you discuss any questions you have with your health care provider.  

## 2014-10-24 NOTE — ED Notes (Signed)
Patient here with shortness of breath.  Mom states that she has been taking a Zpak and prednisone.  Patient has not been able to sleep.  Patient uses some albuterol and Pulmicort.

## 2014-10-24 NOTE — ED Notes (Signed)
resp tech at bedside to listen to lungs.

## 2014-10-24 NOTE — ED Provider Notes (Signed)
Pt seen by Dr Gwendolyn Grant earlier today.  Please see his note for additional information.  Labs were pending. No significant findings on labs and xray.  Pt stable for discharge.   Linwood Dibbles, MD 10/24/14 [phone]

## 2014-10-26 LAB — CULTURE, GROUP A STREP: STREP A CULTURE: NEGATIVE

## 2014-11-01 ENCOUNTER — Encounter (HOSPITAL_COMMUNITY): Payer: Self-pay | Admitting: Emergency Medicine

## 2014-11-01 DIAGNOSIS — N3010 Interstitial cystitis (chronic) without hematuria: Secondary | ICD-10-CM | POA: Diagnosis not present

## 2014-11-01 DIAGNOSIS — Z79899 Other long term (current) drug therapy: Secondary | ICD-10-CM | POA: Diagnosis not present

## 2014-11-01 DIAGNOSIS — Z792 Long term (current) use of antibiotics: Secondary | ICD-10-CM | POA: Diagnosis not present

## 2014-11-01 DIAGNOSIS — Z7951 Long term (current) use of inhaled steroids: Secondary | ICD-10-CM | POA: Diagnosis not present

## 2014-11-01 DIAGNOSIS — Z793 Long term (current) use of hormonal contraceptives: Secondary | ICD-10-CM | POA: Diagnosis not present

## 2014-11-01 DIAGNOSIS — F840 Autistic disorder: Secondary | ICD-10-CM | POA: Insufficient documentation

## 2014-11-01 DIAGNOSIS — J45901 Unspecified asthma with (acute) exacerbation: Secondary | ICD-10-CM | POA: Diagnosis not present

## 2014-11-01 DIAGNOSIS — G40909 Epilepsy, unspecified, not intractable, without status epilepticus: Secondary | ICD-10-CM | POA: Insufficient documentation

## 2014-11-01 DIAGNOSIS — Z880 Allergy status to penicillin: Secondary | ICD-10-CM | POA: Diagnosis not present

## 2014-11-01 DIAGNOSIS — E663 Overweight: Secondary | ICD-10-CM | POA: Diagnosis not present

## 2014-11-01 DIAGNOSIS — Z7952 Long term (current) use of systemic steroids: Secondary | ICD-10-CM | POA: Diagnosis not present

## 2014-11-01 DIAGNOSIS — Z8719 Personal history of other diseases of the digestive system: Secondary | ICD-10-CM | POA: Diagnosis not present

## 2014-11-01 DIAGNOSIS — R05 Cough: Secondary | ICD-10-CM | POA: Diagnosis present

## 2014-11-01 MED ORDER — ALBUTEROL SULFATE (2.5 MG/3ML) 0.083% IN NEBU
5.0000 mg | INHALATION_SOLUTION | Freq: Once | RESPIRATORY_TRACT | Status: AC
  Administered 2014-11-01: 5 mg via RESPIRATORY_TRACT
  Filled 2014-11-01: qty 6

## 2014-11-01 NOTE — ED Notes (Signed)
Pt. reports persistent productive cough , chest congestion , SOB / wheezing onset this week . Denies fever or chills.

## 2014-11-02 ENCOUNTER — Emergency Department (HOSPITAL_COMMUNITY): Payer: Medicare Other

## 2014-11-02 ENCOUNTER — Emergency Department (HOSPITAL_COMMUNITY)
Admission: EM | Admit: 2014-11-02 | Discharge: 2014-11-02 | Disposition: A | Discharge status: Home / Self Care | Payer: Medicare Other | Attending: Emergency Medicine | Admitting: Emergency Medicine

## 2014-11-02 DIAGNOSIS — J40 Bronchitis, not specified as acute or chronic: Secondary | ICD-10-CM

## 2014-11-02 DIAGNOSIS — J45901 Unspecified asthma with (acute) exacerbation: Secondary | ICD-10-CM | POA: Diagnosis not present

## 2014-11-02 LAB — URINALYSIS, ROUTINE W REFLEX MICROSCOPIC
Bilirubin Urine: NEGATIVE
GLUCOSE, UA: NEGATIVE mg/dL
HGB URINE DIPSTICK: NEGATIVE
Ketones, ur: 15 mg/dL — AB
Nitrite: NEGATIVE
PH: 5.5 (ref 5.0–8.0)
Protein, ur: NEGATIVE mg/dL
Specific Gravity, Urine: 1.034 — ABNORMAL HIGH (ref 1.005–1.030)
Urobilinogen, UA: 1 mg/dL (ref 0.0–1.0)

## 2014-11-02 LAB — URINE MICROSCOPIC-ADD ON

## 2014-11-02 MED ORDER — DEXAMETHASONE 4 MG PO TABS
10.0000 mg | ORAL_TABLET | Freq: Once | ORAL | Status: AC
  Administered 2014-11-02: 10 mg via ORAL
  Filled 2014-11-02: qty 3

## 2014-11-02 NOTE — ED Notes (Signed)
Pt ambulated well in hallway with no difficulties breathing or desating. Pt maintained O2 sats at 98-100.

## 2014-11-02 NOTE — Discharge Instructions (Signed)
Your child was seen today for wheezing and shortness of breath. She has been seen multiple times and has been on several doses of steroid-induced and antibiotics. Her exam and workup is reassuring at this time. You need to follow-up with primary physician. She was given one dose of long-acting steriods in the ER. No further sterile will be given at home.  Chronic Asthmatic Bronchitis Chronic asthmatic bronchitis is a complication of persistent asthma. After a period of time with asthma, some people develop airflow obstruction that is present all the time, even when not having an asthma attack.There is also persistent inflammation of the airways, and the bronchial tubes produce more mucus. Chronic asthmatic bronchitis usually is a permanent problem with the lungs. CAUSES  Chronic asthmatic bronchitis happens most often in people who have asthma and also smoke cigarettes. Occasionally, it can happen to a person with long-standing or severe asthma even if the person is not a smoker. SIGNS AND SYMPTOMS  Chronic asthmatic bronchitis usually causes symptoms of both asthma and chronic bronchitis, including:   Coughing.  Increased sputum production.  Wheezing and shortness of breath.  Chest discomfort.  Recurring infections. DIAGNOSIS  Your health care provider will take a medical history and perform a physical exam. Chronic asthmatic bronchitis is suspected when a person with asthma has abnormal results on breathing tests (pulmonary function tests) even when breathing symptoms are at their best. Other tests, such as a chest X-ray, may be performed to rule out other conditions.  TREATMENT  Treatment involves controlling symptoms with medicine and lifestyle changes.  Your health care provider may prescribe asthma medicines, including inhaler and nebulizer medicines.  Infection can be treated with medicine to kill germs (antibiotics). Serious infections may require hospitalization. These can  include:  Pneumonia.  Sinus infections.  Acute bronchitis.   Preventing infection and hospitalization is very important. Get an influenza vaccination every year as directed by your health care provider. Ask your health care provider whether you need a pneumonia vaccine.  Ask your health care provider whether you would benefit from a pulmonary rehabilitation program. HOME CARE INSTRUCTIONS  Take medicines only as directed by your health care provider.  If you are a cigarette smoker, the most important thing that you can do is quit. Talk to your health care provider for help with quitting smoking.  Avoid pollen, dust, animal dander, molds, smoke, and other things that cause attacks.  Regular exercise is very important to help you feel better. Discuss possible exercise routines with your health care provider.  If animal dander is the cause of asthma, you may not be able to keep pets.  It is important that you:  Become educated about your medical condition.  Participate in maintaining wellness.  Seek medical care as directed. Delay in seeking medical care could cause permanent injury and may be a risk to your life. SEEK MEDICAL CARE IF:  You have wheezing and shortness of breath even if taking medicine to prevent attacks.  You have muscle aches, chest pain, or thickening of sputum.  Your sputum changes from clear or white to yellow, green, gray, or bloody. SEEK IMMEDIATE MEDICAL CARE IF:  Your usual medicines do not stop your wheezing.  You have increased coughing or shortness of breath or both.  You have increased difficulty breathing.  You have any problems from the medicine you are taking, such as a rash, itching, swelling, or trouble breathing. MAKE SURE YOU:   Understand these instructions.  Will watch your condition.  Will get help right away if you are not doing well or get worse. Document Released: 12/20/2005 Document Revised: 07/19/2013 Document Reviewed:  04/12/2013 [facility] Patient Information 2015 [place], [place]. This information is not intended to replace advice given to you by your health care provider. Make sure you discuss any questions you have with your health care provider.

## 2014-11-02 NOTE — ED Notes (Signed)
Pt left with all belongings and ambulated out of treatment area.  

## 2014-11-02 NOTE — ED Provider Notes (Signed)
CSN: [id number]     Arrival date & time 11/01/14  2259 History   This chart was scribed for Shon Baton, MD by Arlan Organ, ED Scribe. This patient was seen in room B18C/B18C and the patient's care was started 3:21 AM.   Chief Complaint  Patient presents with  . Cough   The history is provided by the patient. No language interpreter was used.    HPI Comments: Zamari Bonsall here with her Mother is a 19 y.o. female with PMHx of cerebral palsy, bronchitis, and asthma who presents to the Emergency Department complaining of intermittent, ongoing, unchanged cough and wheezing onset 3-4 weeks. Worse at night. No aggravating or alleviating factors for symptoms. Pt has been evaluated in department for same. Prescribed Z-Pak, Decadron, and albuterol breathing treatments attempted previously without any improvement for symptoms. No recent chills, nausea, or vomiting.  Denies fever.  Past Medical History  Diagnosis Date  . CP (cerebral palsy)     mom denies  . Asthma   . Mental deficiency     hx of aggressive behavior,impaired speech   . Constipation   . Allergy   . Autism     mom states no actual dx, but admits she has some symptoms  . Frequency-urgency syndrome      some incontinence ; wears pull-ups  . Seizure     most recent sz " at the end of summer"  . Interstitial cystitis   . Bronchitis    Past Surgical History  Procedure Laterality Date  . Cystoscopy    . Cystoscopy with hydrodistension and biopsy  04/14/2012    Procedure: CYSTOSCOPY/BIOPSY/HYDRODISTENSION;  Surgeon: Lindaann Slough, MD;  Location: [facility];  Service: Urology;;  instillation of marcaine and pyridium   Family History  Problem Relation Age of Onset  . Seizures Mother   . Seizures Sister     1 sister has   Social History  Substance Use Topics  . Smoking status: Never Smoker   . Smokeless tobacco: Never Used  . Alcohol Use: No   OB History    No data available     Review of  Systems  Constitutional: Negative for fever and chills.  Respiratory: Positive for cough and wheezing.   Gastrointestinal: Negative for vomiting, abdominal pain and diarrhea.  Neurological: Negative for headaches.  All other systems reviewed and are negative.     Allergies  Abilify; Seroquel; Amoxicillin-pot clavulanate; and Keppra  Home Medications   Prior to Admission medications   Medication Sig Start Date End Date Taking? Authorizing Provider  albuterol (PROVENTIL) (2.5 MG/3ML) 0.083% nebulizer solution Take 2.5 mg by nebulization every 6 (six) hours as needed for wheezing or shortness of breath (wheezing).    Yes Historical Provider, MD  budesonide (PULMICORT) 0.5 MG/2ML nebulizer solution Take 0.25 mg by nebulization 2 (two) times daily. Per Dr. Willa Rough, Allergist   Yes Historical Provider, MD  cetirizine (ZYRTEC) 10 MG tablet Take 10 mg by mouth daily.  08/07/11  Yes Historical Provider, MD  cloNIDine (CATAPRES) 0.1 MG tablet Take 1 tab by mouth at bedtime. 08/31/14  Yes Elveria Rising, NP  Cranberry 500 MG CAPS Take 500 mg by mouth daily.   Yes Historical Provider, MD  DEPAKOTE 500 MG DR tablet Take 1 tablet twice per day 08/31/14  Yes Elveria Rising, NP  hyoscyamine (LEVSIN SL) 0.125 MG SL tablet DISSOLVE 1 TABLET UNDER THE TONGUE 2 TIMES A DAY 08/08/14  Yes Historical Provider, MD  Levonorgestrel-Ethinyl Estradiol (SEASONIQUE)  0.15-0.03 &0.01 MG tablet Take 1 tablet by mouth daily. Take 1 active tablet daily, skip non active pills. 10/27/13  Yes Antionette Char, MD  Melatonin 3 MG TABS Take 3 mg by mouth at bedtime. For insomnia   Yes Historical Provider, MD  montelukast (SINGULAIR) 5 MG chewable tablet Chew 5 mg by mouth daily.   Yes Historical Provider, MD  Multiple Vitamin (MULTIVITAMIN WITH MINERALS) TABS tablet Take 1 tablet by mouth daily.   Yes Historical Provider, MD  Olopatadine HCl 0.2 % SOLN Place 1 drop into both eyes daily.   Yes Historical Provider, MD   ondansetron (ZOFRAN ODT) 8 MG disintegrating tablet Take 1 tablet (8 mg total) by mouth every 8 (eight) hours as needed for nausea or vomiting. 07/18/14  Yes Tatyana Kirichenko, PA-C  ondansetron (ZOFRAN) 4 MG tablet Take 1 tablet (4 mg total) by mouth every 6 (six) hours. Patient taking differently: Take 4 mg by mouth every 8 (eight) hours as needed.  06/01/14  Yes Tiffany Neva Seat, PA-C  phenazopyridine (PYRIDIUM) 200 MG tablet Take 1 tablet (200 mg total) by mouth 3 (three) times daily. 07/18/14  Yes Tatyana Kirichenko, PA-C  predniSONE (DELTASONE) 10 MG tablet Take 5 tablets (50 mg total) by mouth daily. 10/20/14  Yes [name], MD  senna (SENOKOT) 8.6 MG TABS tablet Take 1 tablet (8.6 mg total) by mouth daily. 09/08/13 11/02/14 Yes Jon Gills, MD  traZODone (DESYREL) 50 MG tablet Take 2 tabs by mouth at bedtime 08/31/14  Yes Elveria Rising, NP  trimethoprim (TRIMPEX) 100 MG tablet Take 100 mg by mouth every morning.    Yes Historical Provider, MD  vitamin C (ASCORBIC ACID) 500 MG tablet Take 500 mg by mouth daily.   Yes Historical Provider, MD   Triage Vitals: BP 90/49 mmHg  Pulse 104  Temp(Src) 97.9 F (36.6 C) (Oral)  Resp 20  SpO2 97%   Physical Exam  Constitutional:  Overweight  HENT:  Head: Normocephalic and atraumatic.  Mouth/Throat: Oropharynx is clear and moist.  Bilateral TMs clear  Eyes: Pupils are equal, round, and reactive to light.  Cardiovascular: Normal rate, regular rhythm and normal heart sounds.   Pulmonary/Chest: Effort normal and breath sounds normal. No respiratory distress. She has no wheezes.  Abdominal: Soft. Bowel sounds are normal. There is no tenderness. There is no rebound.  Neurological: She is alert.  At baseline  Skin: Skin is warm and dry.  Psychiatric: She has a normal mood and affect.  Nursing note and vitals reviewed.   ED Course  Procedures (including critical care time)  DIAGNOSTIC STUDIES: Oxygen Saturation is 99% on RA, Normal by my  interpretation.    COORDINATION OF CARE: 3:27 AM- Will give breathing treatment. Will order CXR. Discussed treatment plan with pt at bedside and pt agreed to plan.     Labs Review Labs Reviewed  URINALYSIS, ROUTINE W REFLEX MICROSCOPIC (NOT AT [facility]) - Abnormal; Notable for the following:    Color, Urine AMBER (*)    APPearance CLOUDY (*)    Specific Gravity, Urine 1.034 (*)    Ketones, ur 15 (*)    Leukocytes, UA SMALL (*)    All other components within normal limits  URINE MICROSCOPIC-ADD ON - Abnormal; Notable for the following:    Squamous Epithelial / LPF FEW (*)    Bacteria, UA FEW (*)    All other components within normal limits    Imaging Review Dg Chest 2 View  11/02/2014   CLINICAL DATA:  Cough, shortness of breath, wheezing.  EXAM: CHEST  2 VIEW  COMPARISON:  10/24/2014  FINDINGS: Mild hypoventilation with interstitial crowding. There is no edema, consolidation, effusion, or pneumothorax. Normal heart size and mediastinal contours. Intact bony thorax.  IMPRESSION: No active cardiopulmonary disease.   Electronically Signed   By: Marnee Spring M.D.   On: 11/02/2014 01:10   I have personally reviewed and evaluated these images and lab results as part of my medical decision-making.   EKG Interpretation None      MDM   Final diagnoses:  Bronchitis    Patient presents with mother for persistent cough and shortness of breath. Not currently wheezing. Received a DuoNeb in triage. She is at her baseline. She has had multiple doses of steroids and antibiotics at this time. Afebrile. Symptoms seem to be worse at night during sleep. Discussed with mother possibility of sleep apnea or reflux. Given that she was given a DuoNeb in triage for wheezing, patient was given 1 dose of Decadron. Mother is requesting urinalysis be checked for infection. Urine is reassuring.  Patient was able to ambulate and maintain pulse ox greater than 92% with ambulation.  No further antibiotics indicated  at this time. Instructed mother to follow-up closely with pediatrician for possible sleep study and/or initiation of PPI for possible GERD. Mother stated understanding.  After history, exam, and medical workup I feel the patient has been appropriately medically screened and is safe for discharge home. Pertinent diagnoses were discussed with the patient. Patient was given return precautions.   I personally performed the services described in this documentation, which was scribed in my presence. The recorded information has been reviewed and is accurate.   Shon Baton, MD 11/02/14 2258

## 2014-11-02 NOTE — ED Notes (Signed)
Pt left with all belongings and was wheeled out of treament area with mother.

## 2014-11-02 NOTE — ED Notes (Signed)
Pt ambulated in hallway without difficulty. 02 saturation dropped to 90-92% with ambulation. Pulse 100-110.

## 2014-11-07 ENCOUNTER — Encounter (HOSPITAL_COMMUNITY): Payer: Self-pay | Admitting: Vascular Surgery

## 2014-11-07 ENCOUNTER — Emergency Department (HOSPITAL_COMMUNITY): Payer: Medicare Other

## 2014-11-07 DIAGNOSIS — R0989 Other specified symptoms and signs involving the circulatory and respiratory systems: Secondary | ICD-10-CM | POA: Insufficient documentation

## 2014-11-07 DIAGNOSIS — R05 Cough: Secondary | ICD-10-CM | POA: Diagnosis present

## 2014-11-07 DIAGNOSIS — G40909 Epilepsy, unspecified, not intractable, without status epilepticus: Secondary | ICD-10-CM | POA: Insufficient documentation

## 2014-11-07 DIAGNOSIS — Z79899 Other long term (current) drug therapy: Secondary | ICD-10-CM | POA: Diagnosis not present

## 2014-11-07 DIAGNOSIS — Z8719 Personal history of other diseases of the digestive system: Secondary | ICD-10-CM | POA: Diagnosis not present

## 2014-11-07 DIAGNOSIS — Z880 Allergy status to penicillin: Secondary | ICD-10-CM | POA: Insufficient documentation

## 2014-11-07 DIAGNOSIS — N3010 Interstitial cystitis (chronic) without hematuria: Secondary | ICD-10-CM | POA: Diagnosis not present

## 2014-11-07 DIAGNOSIS — Z793 Long term (current) use of hormonal contraceptives: Secondary | ICD-10-CM | POA: Diagnosis not present

## 2014-11-07 DIAGNOSIS — Z792 Long term (current) use of antibiotics: Secondary | ICD-10-CM | POA: Diagnosis not present

## 2014-11-07 DIAGNOSIS — J45901 Unspecified asthma with (acute) exacerbation: Secondary | ICD-10-CM | POA: Insufficient documentation

## 2014-11-07 DIAGNOSIS — Z7952 Long term (current) use of systemic steroids: Secondary | ICD-10-CM | POA: Insufficient documentation

## 2014-11-07 DIAGNOSIS — F840 Autistic disorder: Secondary | ICD-10-CM | POA: Insufficient documentation

## 2014-11-07 MED ORDER — ALBUTEROL SULFATE (2.5 MG/3ML) 0.083% IN NEBU
INHALATION_SOLUTION | RESPIRATORY_TRACT | Status: AC
  Filled 2014-11-07: qty 6

## 2014-11-07 MED ORDER — ALBUTEROL SULFATE (2.5 MG/3ML) 0.083% IN NEBU
5.0000 mg | INHALATION_SOLUTION | Freq: Once | RESPIRATORY_TRACT | Status: AC
  Administered 2014-11-07: 5 mg via RESPIRATORY_TRACT

## 2014-11-07 NOTE — ED Notes (Signed)
Pt reports to the ED for eval of wheezing. She also had a fall to the floor today and she had a possible seizure. She has hx of seizure. No tongue injury or incontinence. She was recently dx with bronchitis. She was seen a few days ago and dx with bronchitis and given dexamethasone. She has been through 2- Z-packs without relief. She has hx of asthma. She is also having increased urination and her mother thinks she may have a UTI. She has also had brown nasal discharge. Pt A&Ox4, resp e/u, and skin warm and dry.

## 2014-11-08 ENCOUNTER — Telehealth: Payer: Self-pay | Admitting: *Deleted

## 2014-11-08 ENCOUNTER — Emergency Department (HOSPITAL_COMMUNITY)
Admission: EM | Admit: 2014-11-08 | Discharge: 2014-11-08 | Disposition: A | Discharge status: Home / Self Care | Payer: Medicare Other | Attending: Emergency Medicine | Admitting: Emergency Medicine

## 2014-11-08 DIAGNOSIS — R05 Cough: Secondary | ICD-10-CM | POA: Diagnosis not present

## 2014-11-08 DIAGNOSIS — R059 Cough, unspecified: Secondary | ICD-10-CM

## 2014-11-08 LAB — URINALYSIS, ROUTINE W REFLEX MICROSCOPIC
BILIRUBIN URINE: NEGATIVE
Glucose, UA: NEGATIVE mg/dL
HGB URINE DIPSTICK: NEGATIVE
KETONES UR: 15 mg/dL — AB
Leukocytes, UA: NEGATIVE
NITRITE: NEGATIVE
Protein, ur: NEGATIVE mg/dL
Specific Gravity, Urine: 1.028 (ref 1.005–1.030)
UROBILINOGEN UA: 1 mg/dL (ref 0.0–1.0)
pH: 6.5 (ref 5.0–8.0)

## 2014-11-08 NOTE — Telephone Encounter (Signed)
Called mom and set up appt, I also advised her rx for rehabilitation was ready for pick up. She states she would like to get it at next appt.

## 2014-11-08 NOTE — Telephone Encounter (Signed)
I left a message for mother to call back.  She had a sleep study performed in August 2013 that did not show sleep apnea.  I need more information to see if something has changed.  I'm not certain where the sleep study was done.  She will be challenging in order to get a good study.  This is a 65 was carried out it [place] sleep lab.  The patient was seen last night in the emergency department and was noted to have wheezing or she has wheezing and coughing, I think that she has a lung problem rather than sleep apnea.  Please had up an appointment with me for my next opening.  It can be a new patient slot.

## 2014-11-08 NOTE — ED Provider Notes (Signed)
CSN: [id number]   Arrival date & time 11/07/14 2236  History  This chart was scribed for Zadie Rhine, MD by Bethel Born, ED Scribe. This patient was seen in room A10C/A10C and the patient's care was started at 2:00 AM.  Chief Complaint  Patient presents with  . Cough    HPI Patient is a 19 y.o. female presenting with cough. The history is provided by a parent. No language interpreter was used.  Cough Cough characteristics:  Non-productive Severity:  Moderate Onset quality:  Gradual Duration:  4 weeks Timing:  Intermittent Progression:  Unchanged Chronicity:  New Smoker: no   Relieved by:  Nothing Worsened by:  Lying down Ineffective treatments:  Home nebulizer Associated symptoms: wheezing    Lori Miles is a 19 y.o. female with PMHx of cerebral palsy, asthma, seizures  who presents to the Emergency Department with her mother complaining of a persistent and  intermittent dry cough with onset 3-4 weeks ago. The cough is worse at night while she sleeps. Associated symptoms include audible wheezing that is worse while sleeping and brown "mushy" nasal discharge. Pt was seen in the ED on 11/01/14 and diagnosed with bronchitis. Z-pack, Pulmicort, and home breathing treatments have not improved her symptoms. Pt had a single seizure tonight that her mother believes was triggered by a respiratory problem. Her mother has not been successful in her attempts to f/u with the patient's PCP.   Pt has h/o seizure and tonight's SZ was similar to prior  Past Medical History  Diagnosis Date  . CP (cerebral palsy)     mom denies  . Asthma   . Mental deficiency     hx of aggressive behavior,impaired speech   . Constipation   . Allergy   . Autism     mom states no actual dx, but admits she has some symptoms  . Frequency-urgency syndrome      some incontinence ; wears pull-ups  . Seizure     most recent sz " at the end of summer"  . Interstitial cystitis   . Bronchitis     Past  Surgical History  Procedure Laterality Date  . Cystoscopy    . Cystoscopy with hydrodistension and biopsy  04/14/2012    Procedure: CYSTOSCOPY/BIOPSY/HYDRODISTENSION;  Surgeon: Lindaann Slough, MD;  Location: [facility];  Service: Urology;;  instillation of marcaine and pyridium    Family History  Problem Relation Age of Onset  . Seizures Mother   . Seizures Sister     1 sister has    Social History  Substance Use Topics  . Smoking status: Never Smoker   . Smokeless tobacco: Never Used  . Alcohol Use: No     Review of Systems  HENT:       Nasal discharge  Respiratory: Positive for cough and wheezing.   Neurological: Positive for seizures.  All other systems reviewed and are negative.   Home Medications   Prior to Admission medications   Medication Sig Start Date End Date Taking? Authorizing Provider  albuterol (PROVENTIL) (2.5 MG/3ML) 0.083% nebulizer solution Take 2.5 mg by nebulization every 6 (six) hours as needed for wheezing or shortness of breath (wheezing).     Historical Provider, MD  budesonide (PULMICORT) 0.5 MG/2ML nebulizer solution Take 0.25 mg by nebulization 2 (two) times daily. Per Dr. Willa Rough, Allergist    Historical Provider, MD  cetirizine (ZYRTEC) 10 MG tablet Take 10 mg by mouth daily.  08/07/11   Historical Provider, MD  cloNIDine (  CATAPRES) 0.1 MG tablet Take 1 tab by mouth at bedtime. 08/31/14   Elveria Rising, NP  Cranberry 500 MG CAPS Take 500 mg by mouth daily.    Historical Provider, MD  DEPAKOTE 500 MG DR tablet Take 1 tablet twice per day 08/31/14   Elveria Rising, NP  hyoscyamine (LEVSIN SL) 0.125 MG SL tablet DISSOLVE 1 TABLET UNDER THE TONGUE 2 TIMES A DAY 08/08/14   Historical Provider, MD  Levonorgestrel-Ethinyl Estradiol (SEASONIQUE) 0.15-0.03 &0.01 MG tablet Take 1 tablet by mouth daily. Take 1 active tablet daily, skip non active pills. 10/27/13   Antionette Char, MD  Melatonin 3 MG TABS Take 3 mg by mouth at bedtime. For  insomnia    Historical Provider, MD  montelukast (SINGULAIR) 5 MG chewable tablet Chew 5 mg by mouth daily.    Historical Provider, MD  Multiple Vitamin (MULTIVITAMIN WITH MINERALS) TABS tablet Take 1 tablet by mouth daily.    Historical Provider, MD  Olopatadine HCl 0.2 % SOLN Place 1 drop into both eyes daily.    Historical Provider, MD  ondansetron (ZOFRAN ODT) 8 MG disintegrating tablet Take 1 tablet (8 mg total) by mouth every 8 (eight) hours as needed for nausea or vomiting. 07/18/14   Tatyana Kirichenko, PA-C  ondansetron (ZOFRAN) 4 MG tablet Take 1 tablet (4 mg total) by mouth every 6 (six) hours. Patient taking differently: Take 4 mg by mouth every 8 (eight) hours as needed.  06/01/14   Tiffany Neva Seat, PA-C  phenazopyridine (PYRIDIUM) 200 MG tablet Take 1 tablet (200 mg total) by mouth 3 (three) times daily. 07/18/14   Tatyana Kirichenko, PA-C  predniSONE (DELTASONE) 10 MG tablet Take 5 tablets (50 mg total) by mouth daily. 10/20/14   [name], MD  senna (SENOKOT) 8.6 MG TABS tablet Take 1 tablet (8.6 mg total) by mouth daily. 09/08/13 11/02/14  Jon Gills, MD  traZODone (DESYREL) 50 MG tablet Take 2 tabs by mouth at bedtime 08/31/14   Elveria Rising, NP  trimethoprim (TRIMPEX) 100 MG tablet Take 100 mg by mouth every morning.     Historical Provider, MD  vitamin C (ASCORBIC ACID) 500 MG tablet Take 500 mg by mouth daily.    Historical Provider, MD    Allergies  Abilify; Seroquel; Amoxicillin-pot clavulanate; and Keppra  Triage Vitals: BP 109/46 mmHg  Pulse 103  Temp(Src) 98.6 F (37 C) (Oral)  Resp 18  SpO2 100%  Physical Exam CONSTITUTIONAL: Well developed/well nourished, no distress noted HEAD: Normocephalic/atraumatic EYES: EOMI/PERRL ENMT: Mucous membranes moist, bilateral TMs clear and intact, clear nasal discharge noted NECK: supple no meningeal signs SPINE/BACK:entire spine nontender CV: S1/S2 noted, no murmurs/rubs/gallops noted LUNGS: Lungs are clear to  auscultation bilaterally, no apparent distress ABDOMEN: soft, nontender, no rebound or guarding, bowel sounds noted throughout abdomen GU:no cva tenderness NEURO: Pt is awake/alert/appropriate, moves all extremitiesx4.  No facial droop.   EXTREMITIES: pulses normal/equal, full ROM SKIN: warm, color normal PSYCH: no abnormalities of mood noted, alert and oriented to situation  ED Course  Procedures DIAGNOSTIC STUDIES: Oxygen Saturation is 100% on RA, normal by my interpretation.    COORDINATION OF CARE: 2:08 AM Discussed treatment plan which includes CXR and labs with patient's mother at bedside and she agreed to plan.  This patient appears very well No added lung sounds CXR negative She is smiling, interactive and asking for food Advised no further ER management required Advised close PCP followup and given info to [place] [facility] wellness BP 104/51 mmHg  Pulse 88  Temp(Src) 98.6 F (37 C) (Oral)  Resp 18  SpO2 100%   Labs Reviewed  URINALYSIS, ROUTINE W REFLEX MICROSCOPIC (NOT AT [facility]) - Abnormal; Notable for the following:    Color, Urine AMBER (*)    Ketones, ur 15 (*)    All other components within normal limits    I, Zadie Rhine, MD, personally reviewed and evaluated these images and lab results as part of my medical decision-making.  Imaging Review Dg Chest 2 View  11/07/2014   CLINICAL DATA:  Cough, congestion, vomiting, and fever. Symptoms for 1 month.  EXAM: CHEST  2 VIEW  COMPARISON:  11/02/2014  FINDINGS: The heart size and mediastinal contours are within normal limits. Both lungs are clear. The visualized skeletal structures are unremarkable.  IMPRESSION: No active cardiopulmonary disease.   Electronically Signed   By: Burman Nieves M.D.   On: 11/07/2014 23:52       MDM   Final diagnoses:  Cough     Nursing notes including past medical history and social history reviewed and considered in documentation xrays/imaging reviewed by myself and  considered during evaluation Labs/vital reviewed myself and considered during evaluation Previous records reviewed and considered  I personally performed the services described in this documentation, which was scribed in my presence. The recorded information has been reviewed and is accurate.      Zadie Rhine, MD 11/08/14 [phone]

## 2014-11-08 NOTE — Telephone Encounter (Signed)
Mom called and states that Lori Miles has been to the ER multiple times. She states that they diagnosed her with bronchitis and they found something in her ear. She states she would like to talk to [name] because the doctors in the ER stated that [name] should get a sleep study due to her not breathing while she is sleeping.  CB# [phone]

## 2014-11-14 ENCOUNTER — Encounter: Payer: Self-pay | Admitting: Pediatrics

## 2014-11-14 ENCOUNTER — Ambulatory Visit (INDEPENDENT_AMBULATORY_CARE_PROVIDER_SITE_OTHER): Payer: Medicare Other | Admitting: Pediatrics

## 2014-11-14 VITALS — BP 110/64 | HR 96 | Ht 58.25 in | Wt 172.0 lb

## 2014-11-14 DIAGNOSIS — G4700 Insomnia, unspecified: Secondary | ICD-10-CM

## 2014-11-14 DIAGNOSIS — G479 Sleep disorder, unspecified: Secondary | ICD-10-CM

## 2014-11-14 DIAGNOSIS — Z79899 Other long term (current) drug therapy: Secondary | ICD-10-CM | POA: Diagnosis not present

## 2014-11-14 DIAGNOSIS — F70 Mild intellectual disabilities: Secondary | ICD-10-CM

## 2014-11-14 DIAGNOSIS — G40309 Generalized idiopathic epilepsy and epileptic syndromes, not intractable, without status epilepticus: Secondary | ICD-10-CM

## 2014-11-14 DIAGNOSIS — G478 Other sleep disorders: Secondary | ICD-10-CM | POA: Insufficient documentation

## 2014-11-14 DIAGNOSIS — L83 Acanthosis nigricans: Secondary | ICD-10-CM | POA: Diagnosis not present

## 2014-11-14 DIAGNOSIS — G40209 Localization-related (focal) (partial) symptomatic epilepsy and epileptic syndromes with complex partial seizures, not intractable, without status epilepticus: Secondary | ICD-10-CM | POA: Diagnosis not present

## 2014-11-14 MED ORDER — CLONIDINE HCL 0.1 MG PO TABS: ORAL_TABLET | ORAL | Status: DC

## 2014-11-14 NOTE — Patient Instructions (Signed)
We will arrange an evaluation at [facility] for a sleep study at [facility] sleep lab.  Laboratories should be done in the next 2 weeks so that we can determine whether or not to change Depakote.

## 2014-11-14 NOTE — Progress Notes (Signed)
Patient: Lori Miles MRN: [id number] Sex: female DOB: 03-Dec-1995  Provider: Deetta Perla, MD Location of Care: [facility]  Note type: Routine return visit  History of Present Illness: Referral Source: Dr. Fleet Contras History from: Lori Miles chart and mother Chief Complaint: Epilepsy  Lori Miles is a 19 y.o. female who returns November 14, 2014, for the first time since August 29, 2014.  She has been seen in the past by my colleague, Elveria Rising.  She has localization related seizures with secondary generalization, mild intellectual disability, episodes of recurrent urinary tract infections and urinary incontinence and problems with sleep including insomnia and arousals.  Recently, Lori Miles was seen in the emergency department twice in a week for problems with wheezing that was related to bronchiolitis.  Mother told me that she thought there was a foreign body in the ear.  I looked at the emergency records from October 24, 2014, November 01, 2014, and November 07, 2014, and I am not able to find evidence of that in any of the examinations.  In her visit on November 01, 2014 and November 02, 2014, questions were raised about the possibility of sleep apnea, or gastroesophageal reflux because of her wheezing which has subsided.  She has had a polysomnogram in August 2013, I see no evidence of the study in the epic chart, which suggest that it was done elsewhere.  Nor can I find evidence of the study in the media section where it might have been scanned.  Mother says that Lori Miles will stop breathing for "10 minutes."  This happens without cyanosis.  I challenged on that, but she stood firm.  I believe that Lori Miles may be having some episodes of sleep apnea, but is not nearly as long as she states.  For difficulties falling asleep and staying asleep, she takes the combinations of clonidine, melatonin, and trazodone.  It is not uncommon for her to have arousals at nighttime.  Other  medical problem that she has is interstitial cystitis, she went to the bathroom on several occasions.  She had seizure-like behavior on November 08, 2014, when she fell to the floor and had some jerking movements.  She was shaking and her eyes rolled back.  This behavior that is seizure-like in nature.  If it was an epileptic event, it would have been the first in quite some time.  It has been some time since Lori Miles had an EEG.  The last was September 15, 2011, which was a normal record in the waking state and drowsiness.  Last drug level was 60.8 mcg/mL on January 01, 2012.  Review of Miles: 12 system review was unremarkable  Past Medical History Diagnosis Date  . CP (cerebral palsy)     mom denies  . Asthma   . Mental deficiency     hx of aggressive behavior,impaired speech   . Constipation   . Allergy   . Autism     mom states no actual dx, but admits she has some symptoms  . Frequency-urgency syndrome      some incontinence ; wears pull-ups  . Seizure     most recent sz " at the end of summer"  . Interstitial cystitis   . Bronchitis    Hospitalizations: No., Head Injury: No., Nervous System Infections: No., Immunizations up to date: Yes.    EEG on September 14, 2011, was normal. She was able to be successfully weaned off Keppra. The patient has significant issues with aggression, which was thought  to be related to Keppra, but has persisted once it was discontinued. The patient was treated with Depakote both for behavior and possible seizures. She had palpitations and severe constipation on Seroquel. She had marked weight gain on Abilify and Depakote. When generic Divalproex was tried she had breakthrough seizures.   Nocturnal polysomnogram on October 27, 2011, failed to show significant periodic limb movements, cyanosis, sleep apnea, or cardiac arrhythmia. EEG on February 20, 2012, was a normal study with the patient awake.  Behavior History none  Surgical History Procedure Laterality  Date  . Cystoscopy    . Cystoscopy with hydrodistension and biopsy  04/14/2012    Procedure: CYSTOSCOPY/BIOPSY/HYDRODISTENSION;  Surgeon: Lindaann Slough, MD;  Location: [facility];  Service: Urology;;  instillation of marcaine and pyridium   Family History family history includes Seizures in her mother and sister. Family history is negative for migraines, seizures, intellectual disabilities, blindness, deafness, birth defects, chromosomal disorder, or autism.  Social History . Marital Status: Single    Spouse Name: N/A  . Number of Children: N/A  . Years of Education: N/A   Social History Main Topics  . Smoking status: Never Smoker   . Smokeless tobacco: Never Used  . Alcohol Use: No  . Drug Use: No  . Sexual Activity: No   Social History Narrative   Lives with mom Lori scientist (physical sciences)) and half-sister Lori Miles) who is also mentally impaired.  Has CAP assistance, urinary incontinence, needs help with feeding,dressing, toileting   Goes to [employer].  In the 9th grade.  Educational level 12th grade School Attending: [employer]. Occupation: Lori Miles  Living with mother and half sister  School comments: Today is Lori Miles's first day of 12 th grade. She is very excited.   Allergies Allergen Reactions  . Abilify [Aripiprazole] Swelling and Palpitations  . Seroquel [Quetiapine Fumarate] Palpitations  . Amoxicillin-Pot Clavulanate Hives  . Keppra [Levetiracetam] Other (See Comments)    Aggressive behavior    Physical Exam BP 110/64 mmHg  Pulse 96  Ht 4' 10.25" (1.48 m)  Wt 172 lb (78.019 kg)  BMI 35.62 kg/m2  General: alert, well developed, obese with short stature, in no acute distress, black hair, brown eyes, right handed  Head: normocephalic, no dysmorphic features  Ears, Nose and Throat: Otoscopic: Tympanic membranes normal. Pharynx: oropharynx is pink without exudates or tonsillar hypertrophy.  Neck: supple, full range of motion, no  cranial or cervical bruits  Respiratory: auscultation clear  Cardiovascular: no murmurs, pulses are normal  Musculoskeletal: no skeletal deformities or apparent scoliosis  Skin: no rashes or neurocutaneous lesions  Neurologic Exam  Mental Status: alert; she is able to follow some commands. She is able to name objects. She is impulsive.  Cranial Nerves: visual fields are full to double simultaneous stimuli; extraocular movements are full and conjugate; pupils are round reactive to light; funduscopic examination shows sharp disc margins with normal vessels; symmetric facial strength; midline tongue and uvula; air conduction is greater than bone conduction bilaterally.  Motor: Normal strength, tone, and mass; good fine motor movements; no pronator drift.  Sensory: intact responses to touch and temperature Coordination: good finger-to-nose, rapid repetitive alternating movements and finger apposition  Gait and Station: Gait is slightly broad-based but not unsteady; patient is able to walk on heels, toes and tandem without difficulty; balance is poor; Romberg exam is negative; Gower response is negative  Reflexes: symmetric and diminished bilaterally; no clonus; bilateral flexor plantar responses.  Assessment 1.  Generalized  convulsive epilepsy, G40.309. 2.  Partial epilepsy with impairment of consciousness, G40.209. 3.  Mild intellectual disability, F70. 4.  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 5.  Insomnia, G47.00. 6.  Acanthosis nigricans, L83.  Discussion Callie seizures have been generally well controlled.  It is not clear whether the episode described above represents recurrent seizure.  This needs to be studied because it hasn't been looked at in a long time.  She had a severe problem with falling and staying asleep.  Etiology of this is unclear.  The last polysomnogram was indeterminate because she did not sleep well.  The purpose of the [facility] evaluation is to set conditions to get  a proper study.  Taking her medications away may be equally problematic given that were trying to study sleep apnea and I don't think that any these medicines are going to affect that if indeed it's present.  Plan Order a trough valproic acid level, CBC with differential, and ALT.  Prescription was issued to refill her Depakote.  At mother's request, I also will arrange for her to be seen at [facility] for a polysomnogram.  I do not know how cooperative Dezzie will be, as we try to obtain a polysomnogram to answer the question whether or not she has sleep apnea.  She has severe problems with insomnia and arousals.  Often before these studies have been the child have to go off of these medications.  She will return to see me in six months' time.  I spent 30 minutes of face-to-face time with Lori Miles and her mother more than half of it in consultation.   Medication List   This list is accurate as of: 11/14/14  2:02 PM.       albuterol (2.5 MG/3ML) 0.083% nebulizer solution  Commonly known as:  PROVENTIL  Take 2.5 mg by nebulization every 6 (six) hours as needed for wheezing or shortness of breath (wheezing).     budesonide 0.5 MG/2ML nebulizer solution  Commonly known as:  PULMICORT  Take 0.25 mg by nebulization 2 (two) times daily. Per Dr. Willa Rough, Allergist     cetirizine 10 MG tablet  Commonly known as:  ZYRTEC  Take 10 mg by mouth daily.     cloNIDine 0.1 MG tablet  Commonly known as:  CATAPRES  Take 1 1/2 tab by mouth at bedtime.     Cranberry 500 MG Caps  Take 500 mg by mouth daily.     DEPAKOTE 500 MG DR tablet  Generic drug:  divalproex  Take 1 tablet twice per day     hyoscyamine 0.125 MG SL tablet  Commonly known as:  LEVSIN SL  DISSOLVE 1 TABLET UNDER THE TONGUE 2 TIMES A DAY     Levonorgestrel-Ethinyl Estradiol 0.15-0.03 &0.01 MG tablet  Commonly known as:  SEASONIQUE  Take 1 tablet by mouth daily. Take 1 active tablet daily, skip non active pills.       Melatonin 3 MG Tabs  Take 3 mg by mouth at bedtime. For insomnia     montelukast 5 MG chewable tablet  Commonly known as:  SINGULAIR  Chew 5 mg by mouth daily.     multivitamin with minerals Tabs tablet  Take 1 tablet by mouth daily.     Olopatadine HCl 0.2 % Soln  Place 1 drop into both eyes daily.     ondansetron 4 MG tablet  Commonly known as:  ZOFRAN  Take 1 tablet (4 mg total) by mouth every 6 (six) hours.  ondansetron 8 MG disintegrating tablet  Commonly known as:  ZOFRAN ODT  Take 1 tablet (8 mg total) by mouth every 8 (eight) hours as needed for nausea or vomiting.     phenazopyridine 200 MG tablet  Commonly known as:  PYRIDIUM  Take 1 tablet (200 mg total) by mouth 3 (three) times daily.     predniSONE 10 MG tablet  Commonly known as:  DELTASONE  Take 5 tablets (50 mg total) by mouth daily.     senna 8.6 MG Tabs tablet  Commonly known as:  SENOKOT  Take 1 tablet (8.6 mg total) by mouth daily.     traZODone 50 MG tablet  Commonly known as:  DESYREL  Take 2 tabs by mouth at bedtime     trimethoprim 100 MG tablet  Commonly known as:  TRIMPEX  Take 100 mg by mouth every morning.     vitamin C 500 MG tablet  Commonly known as:  ASCORBIC ACID  Take 500 mg by mouth daily.      The medication list was reviewed and reconciled. All changes or newly prescribed medications were explained.  A complete medication list was provided to the patient/caregiver.  Deetta Perla MD

## 2014-11-16 ENCOUNTER — Telehealth: Payer: Self-pay | Admitting: *Deleted

## 2014-11-16 DIAGNOSIS — G4700 Insomnia, unspecified: Secondary | ICD-10-CM

## 2014-11-16 MED ORDER — CLONIDINE HCL 0.1 MG PO TABS: ORAL_TABLET | ORAL | Status: DC

## 2014-11-16 NOTE — Telephone Encounter (Signed)
Noted, I dropped the dose to one tablet one and a half, Thank you.

## 2014-11-16 NOTE — Telephone Encounter (Signed)
Mom called and she states that she has decided that she will keep Lori Miles at 1 tablet of cloNIDINE daily instead of the 1.5 tablets that she had talked to you about in clinic. She states she is afraid Lori Miles will be too sleepy when she is at school and she rather not risk that.

## 2014-11-21 ENCOUNTER — Emergency Department (INDEPENDENT_AMBULATORY_CARE_PROVIDER_SITE_OTHER)
Admission: EM | Admit: 2014-11-21 | Discharge: 2014-11-21 | Disposition: A | Discharge status: Home / Self Care | Payer: Medicare Other | Source: Home / Self Care | Attending: Family Medicine | Admitting: Family Medicine

## 2014-11-21 ENCOUNTER — Encounter (HOSPITAL_COMMUNITY): Payer: Self-pay | Admitting: Emergency Medicine

## 2014-11-21 DIAGNOSIS — N3010 Interstitial cystitis (chronic) without hematuria: Secondary | ICD-10-CM | POA: Diagnosis not present

## 2014-11-21 MED ORDER — NITROFURANTOIN MONOHYD MACRO 100 MG PO CAPS: 100.0000 mg | ORAL_CAPSULE | Freq: Two times a day (BID) | ORAL | Status: DC

## 2014-11-21 NOTE — ED Provider Notes (Signed)
CSN: [id number]     Arrival date & time 11/21/14  1437 History   First MD Initiated Contact with Patient 11/21/14 1519     Chief Complaint  Patient presents with  . interstitial cystitis    (Consider location/radiation/quality/duration/timing/severity/associated sxs/prior Treatment) HPI Comments: 19 year old female with history of CP and asthma presents to the urgent care for the first time with the possibility of having a flareup of her interstitial cystitis. Her mother's and we wanted to give any sort of history. The patient is nonverbal. She is screaming, crying, kicking, fighting and has to be restrained by her family. She usually goes to the emergency department but they came here because they were busy. The only information that I have is from the mother who states that she gets this way when ever she has pain due to her interstitial cystitis. Her mother states she has has pain in the back and elsewhere but am unable to confirm that.   Past Medical History  Diagnosis Date  . CP (cerebral palsy)     mom denies  . Asthma   . Mental deficiency     hx of aggressive behavior,impaired speech   . Constipation   . Allergy   . Autism     mom states no actual dx, but admits she has some symptoms  . Frequency-urgency syndrome      some incontinence ; wears pull-ups  . Seizure     most recent sz " at the end of summer"  . Interstitial cystitis   . Bronchitis    Past Surgical History  Procedure Laterality Date  . Cystoscopy    . Cystoscopy with hydrodistension and biopsy  04/14/2012    Procedure: CYSTOSCOPY/BIOPSY/HYDRODISTENSION;  Surgeon: Lindaann Slough, MD;  Location: [facility] [place];  Service: Urology;;  instillation of marcaine and pyridium   Family History  Problem Relation Age of Onset  . Seizures Mother   . Seizures Sister     1 sister has   Social History  Substance Use Topics  . Smoking status: Never Smoker   . Smokeless tobacco: Never Used  . Alcohol Use: No    OB History    No data available     Review of Systems  Unable to perform ROS   Allergies  Abilify; Seroquel; Amoxicillin-pot clavulanate; and Keppra  Home Medications   Prior to Admission medications   Medication Sig Start Date End Date Taking? Authorizing Provider  albuterol (PROVENTIL) (2.5 MG/3ML) 0.083% nebulizer solution Take 2.5 mg by nebulization every 6 (six) hours as needed for wheezing or shortness of breath (wheezing).     Historical Provider, MD  budesonide (PULMICORT) 0.5 MG/2ML nebulizer solution Take 0.25 mg by nebulization 2 (two) times daily. Per Dr. Willa Rough, Allergist    Historical Provider, MD  cetirizine (ZYRTEC) 10 MG tablet Take 10 mg by mouth daily.  08/07/11   Historical Provider, MD  cloNIDine (CATAPRES) 0.1 MG tablet Take 1 tab by mouth at bedtime. 11/16/14   Deetta Perla, MD  Cranberry 500 MG CAPS Take 500 mg by mouth daily.    Historical Provider, MD  DEPAKOTE 500 MG DR tablet Take 1 tablet twice per day 08/31/14   Elveria Rising, NP  hyoscyamine (LEVSIN SL) 0.125 MG SL tablet DISSOLVE 1 TABLET UNDER THE TONGUE 2 TIMES A DAY 08/08/14   Historical Provider, MD  Levonorgestrel-Ethinyl Estradiol (SEASONIQUE) 0.15-0.03 &0.01 MG tablet Take 1 tablet by mouth daily. Take 1 active tablet daily, skip non active pills. 10/27/13  Antionette Char, MD  Melatonin 3 MG TABS Take 3 mg by mouth at bedtime. For insomnia    Historical Provider, MD  montelukast (SINGULAIR) 5 MG chewable tablet Chew 5 mg by mouth daily.    Historical Provider, MD  Multiple Vitamin (MULTIVITAMIN WITH MINERALS) TABS tablet Take 1 tablet by mouth daily.    Historical Provider, MD  nitrofurantoin, macrocrystal-monohydrate, (MACROBID) 100 MG capsule Take 1 capsule (100 mg total) by mouth 2 (two) times daily. 11/21/14   Hayden Rasmussen, NP  Olopatadine HCl 0.2 % SOLN Place 1 drop into both eyes daily.    Historical Provider, MD  ondansetron (ZOFRAN ODT) 8 MG disintegrating tablet Take 1 tablet (8 mg  total) by mouth every 8 (eight) hours as needed for nausea or vomiting. 07/18/14   Tatyana Kirichenko, PA-C  ondansetron (ZOFRAN) 4 MG tablet Take 1 tablet (4 mg total) by mouth every 6 (six) hours. Patient taking differently: Take 4 mg by mouth every 8 (eight) hours as needed.  06/01/14   Tiffany Neva Seat, PA-C  phenazopyridine (PYRIDIUM) 200 MG tablet Take 1 tablet (200 mg total) by mouth 3 (three) times daily. 07/18/14   Tatyana Kirichenko, PA-C  predniSONE (DELTASONE) 10 MG tablet Take 5 tablets (50 mg total) by mouth daily. 10/20/14   [name], MD  senna (SENOKOT) 8.6 MG TABS tablet Take 1 tablet (8.6 mg total) by mouth daily. 09/08/13 11/02/14  Jon Gills, MD  traZODone (DESYREL) 50 MG tablet Take 2 tabs by mouth at bedtime 08/31/14   Elveria Rising, NP  trimethoprim (TRIMPEX) 100 MG tablet Take 100 mg by mouth every morning.     Historical Provider, MD  vitamin C (ASCORBIC ACID) 500 MG tablet Take 500 mg by mouth daily.    Historical Provider, MD   Meds Ordered and Administered this Visit  Medications - No data to display  BP 107/74 mmHg  Pulse 107  Temp(Src) 97.7 F (36.5 C) (Oral)  Resp 18  SpO2 99% No data found.   Physical Exam  Constitutional: She appears distressed.  Obese female crying, yelling and making strange noises, kicking, severely agitated, attempting to tear instruments off the wall. Unable to obtain any information. The patient will not allow a physical examination.  HENT:  No signs of respiratory distress.  Neurological: She is alert.  Psychiatric: She is agitated, aggressive, hyperactive and combative.  Nursing note and vitals reviewed.   ED Course  Procedures (including critical care time)  Labs Review Labs Reviewed - No data to display  Imaging Review No results found.   Visual Acuity Review  Right Eye Distance:   Left Eye Distance:   Bilateral Distance:    Right Eye Near:   Left Eye Near:    Bilateral Near:         MDM   1.  Interstitial cystitis    Macrodantin bid As per history of present illness and physical psychological evaluation unable to obtain information from the patient. Unable to examine her. Due to her extreme agitation will go ahead and prescribe the Macrobid. Her mother is advised that if she is not feeling better from taking this medicine she should take her back to the emergency department.    Hayden Rasmussen, NP 11/21/14 [phone]

## 2014-11-21 NOTE — Discharge Instructions (Signed)

## 2014-11-21 NOTE — ED Notes (Signed)
Mom states this is a recurrent problem with the pt.  The pt was given BBQ sauce last night and the pt started having "symptoms" today.  She has been pulling on her clothes, which her mother states is a sign she is having pain.  She reports past episodes and they have been given Macrobid for treatment.

## 2014-11-29 LAB — CBC WITH DIFFERENTIAL/PLATELET
BASOS ABS: 0 10*3/uL (ref 0.0–0.1)
Basophils Relative: 0 % (ref 0–1)
Eosinophils Absolute: 0 10*3/uL (ref 0.0–0.7)
Eosinophils Relative: 0 % (ref 0–5)
HEMATOCRIT: 34.6 % — AB (ref 36.0–46.0)
Hemoglobin: 11.6 g/dL — ABNORMAL LOW (ref 12.0–15.0)
LYMPHS ABS: 3.3 10*3/uL (ref 0.7–4.0)
Lymphocytes Relative: 37 % (ref 12–46)
MCH: 29.7 pg (ref 26.0–34.0)
MCHC: 33.5 g/dL (ref 30.0–36.0)
MCV: 88.7 fL (ref 78.0–100.0)
MPV: 9.9 fL (ref 8.6–12.4)
Monocytes Absolute: 1 10*3/uL (ref 0.1–1.0)
Monocytes Relative: 11 % (ref 3–12)
Neutro Abs: 4.6 10*3/uL (ref 1.7–7.7)
Neutrophils Relative %: 52 % (ref 43–77)
Platelets: 330 10*3/uL (ref 150–400)
RBC: 3.9 MIL/uL (ref 3.87–5.11)
RDW: 14.1 % (ref 11.5–15.5)
WBC: 8.8 10*3/uL (ref 4.0–10.5)

## 2014-11-29 LAB — ALT: ALT: 13 U/L (ref 6–29)

## 2014-11-29 LAB — VALPROIC ACID LEVEL: Valproic Acid Lvl: 79.5 ug/mL (ref 50.0–100.0)

## 2014-12-09 ENCOUNTER — Telehealth: Payer: Self-pay | Admitting: Pediatrics

## 2014-12-09 NOTE — Telephone Encounter (Signed)
I reviewed the laboratory work with mother.  I would not make changes.  She says that the patient is hitting herself in her sleep and sometimes waking herself up.  I suspect that this is a behavior, possibly myoclonus.  I don't think it represents a seizure.  I told her to try to make a video of the behavior and to give it to the sleep doctor when she sees the doctor next Tuesday.

## 2014-12-13 ENCOUNTER — Encounter: Payer: Self-pay | Admitting: Neurology

## 2014-12-13 ENCOUNTER — Ambulatory Visit (INDEPENDENT_AMBULATORY_CARE_PROVIDER_SITE_OTHER): Payer: Medicare Other | Admitting: Neurology

## 2014-12-13 DIAGNOSIS — F71 Moderate intellectual disabilities: Secondary | ICD-10-CM | POA: Diagnosis not present

## 2014-12-13 DIAGNOSIS — F5105 Insomnia due to other mental disorder: Secondary | ICD-10-CM | POA: Insufficient documentation

## 2014-12-13 DIAGNOSIS — G479 Sleep disorder, unspecified: Secondary | ICD-10-CM | POA: Diagnosis not present

## 2014-12-13 DIAGNOSIS — F489 Nonpsychotic mental disorder, unspecified: Secondary | ICD-10-CM | POA: Diagnosis not present

## 2014-12-13 NOTE — Addendum Note (Signed)
Addended by: Melvyn Novas on: 12/13/2014 03:28 PM   Modules accepted: Orders

## 2014-12-13 NOTE — Progress Notes (Signed)
S[facility]INE CLINIC   Provider:  Melvyn Miles, M D  Referring Provider: Fleet Contras, MD Primary Care Physician:  Lori German, MD  Chief Complaint  Patient presents with  . sleep consult    pt had sleep study done in 2014, unsure of where the the sleep study was performed, rm 11, with family    Chief complaint according to patient : the patient is MRDD, seizures and behavior changed, self inflicting wounds, beating herself.   The mother reports she is breathing loudly, perhaps snoring. She has been frequently in the local ED and there was supposingly witnessed to have  seep apnea.   HPI:  Lori Miles is a 19 y.o. female , seen here as a referral from Dr. Concepcion Miles for another sleep evaluation, she has had a sleep study before we went on [facility] but had no consultation visit.   I have a note here from Dr. Ellison Miles from 11-15-14 he reports that the patient is localized related seizures with secondary generalization, intellectual disability, episodes of recurrent urinary tract infections and incontinence and problems with sleep. Lori Miles was seen in the emergency department twice in 1 week in August 2016 with wheezing that was diagnosed as bronchiolitis. Supposedly this was a descending infection beginning at the ear there was a foreign body in the ear. In August on the eighth, 16th, 22nd and 17th of August questions were raised about the possibility of sleep apnea or GERD because of her wheezing which now has subsided. The patient is seen today as a new patient since her last visit with [name] is more than 3 years ago. Her sleep study at that time included a seizure montage and video, and was performed on 10-27-11. The patient did not have apnea and has sleep study than her AHI was 0.2. She was sick at the time and couldn't sleep her mother reported. She had no periodic limb movements no respiratory arousals and tachycardia throughout the night. The lowest oxygen saturation was 95% her  mother was falling asleep and loudly snoring in the same room and she accompanied her daughter to the sleep study,she was on the cell phone for the reminder of the night.    Sleep habits are as follows: She takes her PM medications at 7 pm and goes to bed at 8 Pm and she will frequently wake up. She will sometimes play with the eye pads or watch some TV and her mother hopes that she will go back to sleep. If she was very tired after school she may sleeps through the night. If she wakes up several times she usually has to rise in the morning at about 4:30 AM. Her mother feels she is jerking in her sleep , involuntary movements. She has not reported night terrors, no nightmares. Than she will hit herself.  Her bus arrives at 6 AM at the parental home. Her mother reports no naps in daytime. There are no nap times provided at school. Usually she has a breakfast at home with some sausage or cereal. She will have lunch at school.  Lori Miles is living with her mother and her older sister.    Review of Systems: Out of a complete 14 system review, the patient complains of only the following symptoms, and all other reviewed systems are negative.  Lori Miles sleepiness score is that of a pediatric candidate.  Child sleep habits usually just goes to bed at the same time at night and usually falls asleep within about 20 minutes,  usually in her own bed. She relies on a special objective fall asleep a stuffed animal or blankets. She rarely resists going to bed but she needs apparent often in the room to fall asleep she feels often that she sleeps too little she has enuresis, sleep talking and she is restless at her sleep. She is not so sleepy when she is physically active but if she is not moving or stimulated can fall asleep easily. She would naps during the day if she had the opportunity to.     Social History   Social History  . Marital Status: Single    Spouse Name: N/A  . Number of Children: N/A  . Years  of Education: N/A   Occupational History  . Not on file.   Social History Main Topics  . Smoking status: Never Smoker   . Smokeless tobacco: Never Used  . Alcohol Use: No  . Drug Use: No  . Sexual Activity: No   Other Topics Concern  . Not on file   Social History Narrative   Lives with mom Research scientist (physical sciences)) and half-sister Lori Miles) who is also mentally impaired.  Has CAP assistance, urinary incontinence, needs help with feeding,dressing, toileting   Goes to [employer].  In the 9th grade.    Family History  Problem Relation Age of Onset  . Seizures Mother   . Seizures Sister     1 sister has    Past Medical History  Diagnosis Date  . CP (cerebral palsy)     mom denies  . Asthma   . Mental deficiency     hx of aggressive behavior,impaired speech   . Constipation   . Allergy   . Autism     mom states no actual dx, but admits she has some symptoms  . Frequency-urgency syndrome      some incontinence ; wears pull-ups  . Seizure     most recent sz " at the end of summer"  . Interstitial cystitis   . Bronchitis     Past Surgical History  Procedure Laterality Date  . Cystoscopy    . Cystoscopy with hydrodistension and biopsy  04/14/2012    Procedure: CYSTOSCOPY/BIOPSY/HYDRODISTENSION;  Surgeon: Lindaann Slough, MD;  Location: [facility] [place];  Service: Urology;;  instillation of marcaine and pyridium    Current Outpatient Prescriptions  Medication Sig Dispense Refill  . albuterol (PROVENTIL) (2.5 MG/3ML) 0.083% nebulizer solution Take 2.5 mg by nebulization every 6 (six) hours as needed for wheezing or shortness of breath (wheezing).     . budesonide (PULMICORT) 0.5 MG/2ML nebulizer solution Take 0.25 mg by nebulization 2 (two) times daily. Per Dr. Willa Rough, Allergist    . cetirizine (ZYRTEC) 10 MG tablet Take 10 mg by mouth daily.     . cloNIDine (CATAPRES) 0.1 MG tablet Take 1 tab by mouth at bedtime. 31 tablet 5  . Cranberry 500 MG  CAPS Take 500 mg by mouth daily.    Marland Kitchen DEPAKOTE 500 MG DR tablet Take 1 tablet twice per day 60 tablet 5  . hyoscyamine (LEVSIN SL) 0.125 MG SL tablet DISSOLVE 1 TABLET UNDER THE TONGUE 2 TIMES A DAY  0  . Levonorgestrel-Ethinyl Estradiol (SEASONIQUE) 0.15-0.03 &0.01 MG tablet Take 1 tablet by mouth daily. Take 1 active tablet daily, skip non active pills. 1 Package 11  . Melatonin 3 MG TABS Take 3 mg by mouth at bedtime. For insomnia    . montelukast (SINGULAIR) 5 MG chewable tablet  Chew 5 mg by mouth daily.    . Multiple Vitamin (MULTIVITAMIN WITH MINERALS) TABS tablet Take 1 tablet by mouth daily.    . nitrofurantoin, macrocrystal-monohydrate, (MACROBID) 100 MG capsule Take 1 capsule (100 mg total) by mouth 2 (two) times daily. 10 capsule 0  . Olopatadine HCl 0.2 % SOLN Place 1 drop into both eyes daily.    . ondansetron (ZOFRAN ODT) 8 MG disintegrating tablet Take 1 tablet (8 mg total) by mouth every 8 (eight) hours as needed for nausea or vomiting. 10 tablet 0  . ondansetron (ZOFRAN) 4 MG tablet Take 1 tablet (4 mg total) by mouth every 6 (six) hours. (Patient taking differently: Take 4 mg by mouth every 8 (eight) hours as needed. ) 12 tablet 0  . phenazopyridine (PYRIDIUM) 200 MG tablet Take 1 tablet (200 mg total) by mouth 3 (three) times daily. 6 tablet 0  . predniSONE (DELTASONE) 10 MG tablet Take 5 tablets (50 mg total) by mouth daily. 20 tablet 0  . traZODone (DESYREL) 50 MG tablet Take 2 tabs by mouth at bedtime 62 tablet 5  . trimethoprim (TRIMPEX) 100 MG tablet Take 100 mg by mouth every morning.     . vitamin C (ASCORBIC ACID) 500 MG tablet Take 500 mg by mouth daily.    Marland Kitchen senna (SENOKOT) 8.6 MG TABS tablet Take 1 tablet (8.6 mg total) by mouth daily. 100 tablet 0   No current facility-administered medications for this visit.    Allergies as of 12/13/2014 - Review Complete 12/13/2014  Allergen Reaction Noted  . Abilify [aripiprazole] Swelling and Palpitations 10/11/2012  .  Seroquel [quetiapine fumarate] Palpitations 09/28/2012  . Amoxicillin-pot clavulanate Hives 02/07/2011  . Keppra [levetiracetam] Other (See Comments) 10/11/2012    Vitals: BP 120/70 mmHg  Pulse 88  Resp 20  Ht  (1.499 m)  Wt 181 lb (82.101 kg)  BMI 36.54 kg/m2 Last Weight:  Wt Readings from Last 1 Encounters:  12/13/14 181 lb (82.101 kg)   HYQ:MVHQ mass index is 36.54 kg/(m^2).     Last Height:   Ht Readings from Last 1 Encounters:  12/13/14  (1.499 m)    Physical exam:  General: The patient is awake, alert and appears not in acute distress. The patient is well groomed. Head: Normocephalic, atraumatic. Neck is supple. Mallampati 4 ,  neck circumference: 15 . Nasal airflow unrestricted  TMJ is not  evident . Retrognathia is  seen.  Cardiovascular:  Regular rate and rhythm , without  murmurs or carotid bruit, and without distended neck veins. Respiratory: Lungs are clear to auscultation. She has a wheezing almost whistling sound eminating from her nose when she inhaled.  Skin:  Without evidence of edema, or rash Trunk: BMI is elevated   Neurologic exam : The patient is awake and alert, but neither oriented to place nor time.   Attention span & concentration ability abnormal for numeric age  Speech is non-  fluent,  Mood and affect are regressed.   Cranial nerves: Pupils are equal and briskly reactive to light. Extraocular movement  in vertical and horizontal planes intact and without nystagmus. Visual fields by finger perimetry are intact. Hearing to finger rub intact.   Facial sensation intact to fine touch.  Facial motor strength is symmetric and tongue and uvula move midline. Shoulder shrug was symmetrical.   Motor exam: unable to follow multipte commands. Normal tone, muscle bulk and symmetric strength in all extremities.  Sensory:  Fine touch, pinprick and vibration  were intact - Proprioception tested in the upper extremities was normal.  Coordination:  Rapid alternating movements in the fingers/hands was normal. Finger-to-nose maneuver  normal without evidence of ataxia, dysmetria or tremor.  Gait and station: Patient walks without assistive device and is able unassisted to climb up to the exam table. Strength within normal limits.  Stance is stable and normal.  Toe and heel stand were difficult  .Tandem gait is fragmented. Turns with  4  Steps. Romberg testing is negative.  Deep tendon reflexes: in the  upper and lower extremities are symmetric and intact. Babinski maneuver response is downgoing.  The patient was advised of the nature of the diagnosed sleep disorder , the treatment options and risks for general a health and wellness arising from not treating the condition.  I spent more than 60 minutes of face to face time with this patient considered pediatric , MRDD with limited verbal output and a poor historian as a family member .  Greater than 50% of time was spent in counseling and coordination of care. We have discussed the diagnosis and differential and I answered the patient's mothers questions.     Assessment:  After physical and neurologic examination, review of laboratory studies,  Personal review of imaging studies, reports of other /same  Imaging studies ,  Results of polysomnography/ neurophysiology testing and pre-existing records as far as provided in visit., my assessment is   1) I was able to find an original referral for sleep study as well as her previous sleep study which did not reveal apnea over 3 years ago. Rucha has gained significant weight after we she was changed to Depakote and Abilify in 2013 and 2014.  Her sleep efficiency was 92% and the sleep study but the patient had called to school EEGs performed in December and interpreted by [name] were normal. She does have asthma wheezing and almost whistling. Her sleep behaviors have not changed over the last 3 years but during her recent ED visits the up  observation of possible  apnea was mentioned and therefore a new  referral initiated.  2) MRDD and seizures , followed by Dr Sharene Skeans.   3) obesity- this has been a problem in 2013 and remains difficult , the patient loses and gains weight frequently .    Plan:  Treatment plan and additional workup :  Dear Annette Stable and Dear Dorma Russell,  Mrs. Emilyrose Darrah is well known to you both. She has a moderate degree of mental retardation, but no aggressive behavior outbursts. According to her mother but she has believed to have problems with insomnia and unwanted arousals from sleep. She is here today because sleep apnea was supposedly witnessed by the emergency room staff. She had frequent emergency room visits. I will order a attended sleep study for this patient with an expanded EEG montage. I encouraged weight loss and activity programs. I would like to follow-up after the sleep study is completed I consider her a poor candidate for CPAP showed apnea been found. I am pleased that she is otherwise neurologically stable. She is trying to take trazodone for insomnia which has been prescribed by [name] and she takes also clonidine and melatonin or prescribed by pediatric neurology.  I will be evaluating her for insomnia , apnea - should these not be found , she will return to Dr Hickling's care .  There is no refill obtained through this office.    Porfirio Mylar Dohmeier MD  12/13/2014   CC: Chrissie Noa  Sharene Skeans , MD     Lori Contras, Md [address] [place], [place] [address]

## 2014-12-14 ENCOUNTER — Telehealth: Payer: Self-pay

## 2014-12-14 NOTE — Telephone Encounter (Signed)
Spoke with patient's mother, she stated to call her back. I spoke with Rosann Auerbach who was on the phone with her a few minutes before, and she stated that the patient's mother told her that the patient has bronchitis. Pt is using Pulmicort neb twice a day and it is not helping. Pt had OV with "sleep doctor", who said he heard wheezing in pt.   Please advise. Informed pt's mother the office would call her back.

## 2014-12-14 NOTE — Telephone Encounter (Signed)
Call Mom -- Increase Pulmicort to four times daily and use Albuteroll neb every 4 hours as needed.  Make appointment here or with primary MD.

## 2014-12-14 NOTE — Telephone Encounter (Signed)
Mom would like for a nurse to give her call about Lori Miles. She is having some asthma issues and also one of her medications is not covered by her insurance.

## 2014-12-15 NOTE — Telephone Encounter (Signed)
Spoke with patients mother and advised per Dr Willa Rough to increase Pulmicort and use the Alb neb PRN. Mother agreed to make appointment and she was transferred to front desk staff to make appointment.

## 2014-12-16 ENCOUNTER — Ambulatory Visit (INDEPENDENT_AMBULATORY_CARE_PROVIDER_SITE_OTHER): Payer: Medicare Other | Admitting: Allergy and Immunology

## 2014-12-16 VITALS — BP 120/75 | HR 90 | Temp 98.6°F | Resp 17

## 2014-12-16 DIAGNOSIS — J4530 Mild persistent asthma, uncomplicated: Secondary | ICD-10-CM

## 2014-12-16 DIAGNOSIS — J45909 Unspecified asthma, uncomplicated: Secondary | ICD-10-CM | POA: Diagnosis not present

## 2014-12-16 DIAGNOSIS — J310 Chronic rhinitis: Secondary | ICD-10-CM

## 2014-12-16 MED ORDER — BUDESONIDE 0.5 MG/2ML IN SUSP: 0.5000 mg | Freq: Three times a day (TID) | RESPIRATORY_TRACT | Status: DC

## 2014-12-16 MED ORDER — BUDESONIDE 0.5 MG/2ML IN SUSP: 0.2500 mg | Freq: Three times a day (TID) | RESPIRATORY_TRACT | Status: DC

## 2014-12-19 NOTE — Progress Notes (Signed)
History of present illness:  Lori Miles returns to the office with her Mother after recent ED visits related to breathing.  Mom states she had  intermittent nasal and chest symptoms in the last 7 weeks.  Mom reports after an ED  Visit related to bead in her ear canal, Lori Miles has had recurring respiratory symptoms prompting multiple subsequent ED visits, for cough, congestion, wheezing and questionable fever.  She completed Prednisone and Zithromax on more than one occasion, ED nebulizer treatments and Mom reports a CXR showed "bronchitis" which would be consistent with chronic changes related to her asthma.  There has been no recent fever, discolored nasal drainage, wheezing or difficulty in breathing.  There does not appear to be any pain, disrupted sleep, appetite or activity.  Any cough is very rare, but Mom wonders about noisy breathing which seems to have a different sound then usual.  Mom reports the Neurologist has scheduled a sleep study given complex history and Lori Miles's increased weight.     Outpatient medications:   Medication List       This list is accurate as of: 12/16/14 11:59 PM.  Always use your most recent med list.               albuterol (2.5 MG/3ML) 0.083% nebulizer solution  Commonly known as:  PROVENTIL  Take 2.5 mg by nebulization every 6 (six) hours as needed for wheezing or shortness of breath (wheezing).     budesonide 0.5 MG/2ML nebulizer solution  Commonly known as:  PULMICORT  Take 2 mLs (0.5 mg total) by nebulization 3 (three) times daily. Per Dr. Willa Rough, Allergist     cetirizine 10 MG tablet  Commonly known as:  ZYRTEC  Take 10 mg by mouth daily.     cloNIDine 0.1 MG tablet  Commonly known as:  CATAPRES  Take 1 tab by mouth at bedtime.     Cranberry 500 MG Caps  Take 500 mg by mouth daily.     DEPAKOTE 500 MG DR tablet  Generic drug:  divalproex  Take 1 tablet twice per day     hyoscyamine 0.125 MG SL tablet  Commonly known as:  LEVSIN SL   DISSOLVE 1 TABLET UNDER THE TONGUE 2 TIMES A DAY     Levonorgestrel-Ethinyl Estradiol 0.15-0.03 &0.01 MG tablet  Commonly known as:  SEASONIQUE  Take 1 tablet by mouth daily. Take 1 active tablet daily, skip non active pills.     Melatonin 3 MG Tabs  Take 3 mg by mouth at bedtime. For insomnia     montelukast 5 MG chewable tablet  Commonly known as:  SINGULAIR  Chew 5 mg by mouth daily.     multivitamin with minerals Tabs tablet  Take 1 tablet by mouth daily.     nitrofurantoin (macrocrystal-monohydrate) 100 MG capsule  Commonly known as:  MACROBID  Take 1 capsule (100 mg total) by mouth 2 (two) times daily.     Olopatadine HCl 0.2 % Soln  Place 1 drop into both eyes daily.     ondansetron 4 MG tablet  Commonly known as:  ZOFRAN  Take 1 tablet (4 mg total) by mouth every 6 (six) hours.     ondansetron 8 MG disintegrating tablet  Commonly known as:  ZOFRAN ODT  Take 1 tablet (8 mg total) by mouth every 8 (eight) hours as needed for nausea or vomiting.     phenazopyridine 200 MG tablet  Commonly known as:  PYRIDIUM  Take 1 tablet (200  mg total) by mouth 3 (three) times daily.     predniSONE 10 MG tablet  Commonly known as:  DELTASONE  Take 5 tablets (50 mg total) by mouth daily.     senna 8.6 MG Tabs tablet  Commonly known as:  SENOKOT  Take 1 tablet (8.6 mg total) by mouth daily.     traZODone 50 MG tablet  Commonly known as:  DESYREL  Take 2 tabs by mouth at bedtime     trimethoprim 100 MG tablet  Commonly known as:  TRIMPEX  Take 100 mg by mouth every morning.     vitamin C 500 MG tablet  Commonly known as:  ASCORBIC ACID  Take 500 mg by mouth daily.        Known medication allergies: Allergies  Allergen Reactions  . Abilify [Aripiprazole] Swelling and Palpitations  . Seroquel [Quetiapine Fumarate] Palpitations  . Amoxicillin-Pot Clavulanate Hives  . Keppra [Levetiracetam] Other (See Comments)    Aggressive behavior     Physical  examination: Blood pressure 120/75, pulse 90, temperature 98.6 F (37 C), resp. rate 17.  General: Alert, interactive, in no acute distress, with no noisy breathing during this exam. HEENT: TMs pearly gray, turbinates minimally edematous, post-pharynx not erythematous. Neck: Supple without lymphadenopathy. Lungs: Clear to auscultation without wheezing, rhonchi or rales, symmetric excellent aeration. CV: Normal S1, S2 without murmurs. Skin: Warm and dry, without lesions or rashes.  Diagnostics: FVC 1.Marland Kitchen92--75%;   FEV1  1.89--81%.     Assessment: 1.  History of mild persistent asthma with clear lung exam and normal in office spirometry, in patient who looks well today with  recent multiple ED visits and s/p systemic steroids and antibiotic treatment. 2.  Chronic rhinitis with intermittent increased symptoms. 3.  Complex medical history, including significant neurological history including seizures and developmental delay. 4.  Noisy nasal breathing in this obese young adult with upcoming sleep study.    Plan: 1.  Mom will increase Pulmicort for the next 5 days to 0.5mg  three times daily then decrease to twice daily with albuterol as needed every 4 hours. 2.  Continue other medication regime  --Singulair at  once daily-- including Patanase 1-2 sprays each nostril each evening after saline nasal wash and saline nasal wash as needed throughout the day. 3.  Albuterol as needed every 4 hours for cough or wheeze. 4.  Follow-up in the next week via phone update after consistent upper airway management and in office in 3 months or sooner if needed, as Lori Miles appears asymptomatic during this office visit. 5.  Await sleep study results.    Darris Carachure M. Willa Rough, MD

## 2014-12-20 ENCOUNTER — Other Ambulatory Visit: Payer: Self-pay | Admitting: Neurology

## 2014-12-20 DIAGNOSIS — J45901 Unspecified asthma with (acute) exacerbation: Secondary | ICD-10-CM

## 2014-12-20 MED ORDER — MONTELUKAST SODIUM 10 MG PO TABS: 10.0000 mg | ORAL_TABLET | Freq: Every day | ORAL | Status: DC

## 2014-12-21 ENCOUNTER — Other Ambulatory Visit: Payer: Self-pay | Admitting: Obstetrics

## 2014-12-26 ENCOUNTER — Telehealth: Payer: Self-pay | Admitting: Allergy and Immunology

## 2014-12-26 NOTE — Telephone Encounter (Signed)
Called mother advised that Dr.Hicks will be out of the office and will not get the message until 12/27/14 she will call her back or the nurses would. Asked to make sure patient is doing ok or if she can't wait, mother just states that she is doing the same as last visit.

## 2014-12-26 NOTE — Telephone Encounter (Signed)
Mom called and said that you were going to her back about Calli asthma.

## 2015-01-06 ENCOUNTER — Ambulatory Visit (INDEPENDENT_AMBULATORY_CARE_PROVIDER_SITE_OTHER): Payer: Medicare Other | Admitting: Neurology

## 2015-01-06 DIAGNOSIS — G4710 Hypersomnia, unspecified: Secondary | ICD-10-CM

## 2015-01-06 DIAGNOSIS — G479 Sleep disorder, unspecified: Secondary | ICD-10-CM

## 2015-01-06 DIAGNOSIS — F71 Moderate intellectual disabilities: Secondary | ICD-10-CM

## 2015-01-06 DIAGNOSIS — F5105 Insomnia due to other mental disorder: Secondary | ICD-10-CM

## 2015-01-07 NOTE — Sleep Study (Signed)
Please see the scanned sleep study interpretation located in the procedure tab within the chart review section.   

## 2015-01-12 NOTE — Telephone Encounter (Signed)
Lakeisa'S MOM CALLED TO RETURN SOMEONE'S PHONE CALL, SHE DOESN'T KNOW WHO CALLED HER.  PLEASE ADVISE  THANKS

## 2015-01-15 NOTE — Telephone Encounter (Signed)
Phone call to patient's Mom for update.  Left message on voicemail of my call today and earlier this week.  Will try again.

## 2015-01-16 ENCOUNTER — Telehealth: Payer: Self-pay

## 2015-01-16 ENCOUNTER — Telehealth: Payer: Self-pay | Admitting: Family

## 2015-01-16 DIAGNOSIS — R0683 Snoring: Secondary | ICD-10-CM

## 2015-01-16 NOTE — Telephone Encounter (Signed)
Spoke with pt's mother. She wants me to fax a copy of the sleep study to Dr. Annalee GentaShoemaker, pt's ENT, because she is going to call their office and make an appt. She says that she doesn't need a referral for them. A copy of the sleep study was faxed to Dr. Sharene SkeansHickling, Dr.  Concepcion ElkAvbuere, and Dr. Annalee GentaShoemaker. Referral to Dr. Myrtis SerKatz to evaluate for oral appliance also sent.

## 2015-01-16 NOTE — Addendum Note (Signed)
Addended by: Geronimo RunningINKINS, Loralyn Rachel A on: 01/16/2015 02:27 PM   Modules accepted: Orders

## 2015-01-16 NOTE — Telephone Encounter (Signed)
Mom [name] Knights left message about Lori Miles. She said that she had a sleep study and received the results from Dr Dohmeier's office. Mom wants to discuss the results with Dr Sharene SkeansHickling. She wonders if [place] needs a second opinion. Please call Mom at [phone]. TG

## 2015-01-16 NOTE — Telephone Encounter (Signed)
I spoke with mom for about 4-1/2 minutes.  The study was essentially normal.  She slept 91% of the time had normal percentages of different sleep states, had a little bit of periodic limb movements that did not wake her up did not have sleep apnea though she had snoring.  There is nothing to do about this.  She does not need a second opinion.

## 2015-01-16 NOTE — Telephone Encounter (Signed)
Dear Lori Miles, Your daughter Lori Miles was not diagnosed with a significant degree of sleep apnea nor limb movements at night. She would not be a candidate for a CPAP therapy. Since she does snore a dental device is usually better tolerated alternative. Ear nose and throat surgery can also be helpful in reducing snoring. Since your daughter has trouble weightbearing her retainer-braces at night, the dental device may not be a feasible option for her.  I understand that you did not want to attend a follow-up appointment and I will ask you referring physician to discuss the results with you based on my report which was forwarded to him.   Sincerely Melvyn Novasarmen Brendon Christoffel, MD

## 2015-01-16 NOTE — Telephone Encounter (Signed)
I called pt's mother. I advised her that Dr. Vickey Hugerohmeier recommended an ENT evaluation since pt cannot wear a retainer at night. [name] reports that she has already seen an ENT and doesn't want another one. [name] reports that she does want to try the oral appliance for the pt.  I advised her that we would send a referral to Dr. Myrtis SerKatz and their office would be contacting them to set up that appt. I advised pt's mother that we do not need to see her to follow up. [name] verbalized understanding.

## 2015-01-16 NOTE — Telephone Encounter (Signed)
Spoke to pt's mother regarding pt's sleep study results. I advised pt's mother that snoring, but no significant sleep apnea or significant plms were found during pt's study. Dr. Vickey Hugerohmeier recommended positional therapy and to consider a dental device to help reduce snoring and bruxism.  [name] reports that the pt cannot sleep on her side and cannot tolerate even wearing her retainer for her braces at night. She must wear her retainer during the day. [name] is wondering if there is anything else that Dr. Vickey Hugerohmeier recommends regarding the snoring. I offered a f/u appt to discuss these results with Dr. Vickey Hugerohmeier but she refused. She wants a phone call from Dr. Vickey Hugerohmeier. I advised pt's mother that I would send this message to her, and then I would call her back with recommendations. [name] verbalized understanding.

## 2015-01-16 NOTE — Telephone Encounter (Signed)
[name] called requesting to speak with Baxter HireKristen. Please call and advise

## 2015-01-17 ENCOUNTER — Other Ambulatory Visit: Payer: Self-pay | Admitting: Neurology

## 2015-01-17 DIAGNOSIS — J45909 Unspecified asthma, uncomplicated: Secondary | ICD-10-CM

## 2015-01-17 MED ORDER — BUDESONIDE 0.5 MG/2ML IN SUSP: 0.5000 mg | Freq: Two times a day (BID) | RESPIRATORY_TRACT | Status: DC

## 2015-01-17 NOTE — Telephone Encounter (Signed)
Please call [phone] and speak with patient's mother.  She would not give any information.  Thanks!

## 2015-01-17 NOTE — Telephone Encounter (Signed)
Spoke to pt's mother. She says they saw Dr. Sharene SkeansHickling yesterday and don't want the referral for Dr. Myrtis SerKatz now. I advised her that it was already sent, but if they call, just say that they don't want the appt. She also wanted to make sure the SS was faxed to Dr. Annalee GentaShoemaker, and it was faxed yesterday, and I advised her of this. [name] verbalized understanding.

## 2015-01-17 NOTE — Telephone Encounter (Signed)
Phone call to patient's Mom to review sleep study results.  She reports unclear information about study.  She reports on/off noisy nasal sounds without wheeze, difficulty in breathing or recurring cough.  Reviewed with Mom making appointment in follow-up with Dr. Annalee GentaShoemaker and also change Patanase to one spray twice daily.  Mom will also try to use saline nasal wash prior to Patanase in the evening.  Mom will decrease Pulmicort as it does not seem symptoms are chest related and keep follow-up as planned. Mom will update us on repeat sleep study possibility.  Mom agrees with plan.  Will send in additional Pulmicort refill today.

## 2015-01-17 NOTE — Telephone Encounter (Signed)
Returned pt's mother's call. No answer, left a message.  Please find out what she is calling regarding. I have already faxed the referral to Dr. Myrtis SerKatz for the oral appliance and their office will call them to set up an appt. A cop of her sleep study was faxed to Dr. Sharene SkeansHickling, Dr. Mallie MusselAvburere, and Dr. Annalee GentaShoemaker.

## 2015-01-18 ENCOUNTER — Telehealth: Payer: Self-pay | Admitting: *Deleted

## 2015-01-18 NOTE — Telephone Encounter (Signed)
Patient's mother called- patient is special needs and she is having a problem with urinary leakage. She sees a specialist in [place]- but the car ride is very hard for her. Dr Yetta FlockHodges is [facility] Urologist at [facility] and he is wanting to order some testing for her. Her mother wants it done here in [place] because it is so traumatic for her to travel. She wants to know if Dr Clearance CootsHarper can order the test. Told patient the only why that would happen is if Dr Yetta Flockhodges called him and spoke to him about the matter. She is aware and will have him call.

## 2015-02-13 ENCOUNTER — Emergency Department (HOSPITAL_COMMUNITY)
Admission: EM | Admit: 2015-02-13 | Discharge: 2015-02-13 | Disposition: A | Discharge status: Home / Self Care | Payer: Medicare Other | Attending: Emergency Medicine | Admitting: Emergency Medicine

## 2015-02-13 ENCOUNTER — Encounter (HOSPITAL_COMMUNITY): Payer: Self-pay | Admitting: Emergency Medicine

## 2015-02-13 ENCOUNTER — Emergency Department (HOSPITAL_COMMUNITY): Payer: Medicare Other

## 2015-02-13 ENCOUNTER — Telehealth: Payer: Self-pay | Admitting: *Deleted

## 2015-02-13 DIAGNOSIS — J069 Acute upper respiratory infection, unspecified: Secondary | ICD-10-CM

## 2015-02-13 DIAGNOSIS — J189 Pneumonia, unspecified organism: Secondary | ICD-10-CM

## 2015-02-13 DIAGNOSIS — J159 Unspecified bacterial pneumonia: Secondary | ICD-10-CM | POA: Insufficient documentation

## 2015-02-13 DIAGNOSIS — Z87448 Personal history of other diseases of urinary system: Secondary | ICD-10-CM | POA: Insufficient documentation

## 2015-02-13 DIAGNOSIS — Z79899 Other long term (current) drug therapy: Secondary | ICD-10-CM | POA: Diagnosis not present

## 2015-02-13 DIAGNOSIS — R062 Wheezing: Secondary | ICD-10-CM

## 2015-02-13 DIAGNOSIS — Z8669 Personal history of other diseases of the nervous system and sense organs: Secondary | ICD-10-CM | POA: Insufficient documentation

## 2015-02-13 DIAGNOSIS — Z880 Allergy status to penicillin: Secondary | ICD-10-CM | POA: Insufficient documentation

## 2015-02-13 DIAGNOSIS — R05 Cough: Secondary | ICD-10-CM | POA: Diagnosis present

## 2015-02-13 DIAGNOSIS — R059 Cough, unspecified: Secondary | ICD-10-CM

## 2015-02-13 DIAGNOSIS — Z8719 Personal history of other diseases of the digestive system: Secondary | ICD-10-CM | POA: Insufficient documentation

## 2015-02-13 DIAGNOSIS — F840 Autistic disorder: Secondary | ICD-10-CM | POA: Insufficient documentation

## 2015-02-13 DIAGNOSIS — J45901 Unspecified asthma with (acute) exacerbation: Secondary | ICD-10-CM

## 2015-02-13 DIAGNOSIS — R0602 Shortness of breath: Secondary | ICD-10-CM

## 2015-02-13 MED ORDER — IPRATROPIUM BROMIDE 0.02 % IN SOLN
0.5000 mg | Freq: Once | RESPIRATORY_TRACT | Status: AC
  Administered 2015-02-13: 0.5 mg via RESPIRATORY_TRACT
  Filled 2015-02-13: qty 2.5

## 2015-02-13 MED ORDER — PREDNISONE 20 MG PO TABS
60.0000 mg | ORAL_TABLET | Freq: Once | ORAL | Status: AC
  Administered 2015-02-13: 60 mg via ORAL
  Filled 2015-02-13: qty 3

## 2015-02-13 MED ORDER — PREDNISONE 20 MG PO TABS
ORAL_TABLET | ORAL | Status: DC
Start: 2015-02-13 — End: 2015-02-15

## 2015-02-13 MED ORDER — DOXYCYCLINE HYCLATE 100 MG PO CAPS: 100.0000 mg | ORAL_CAPSULE | Freq: Two times a day (BID) | ORAL | Status: DC

## 2015-02-13 MED ORDER — ALBUTEROL SULFATE (2.5 MG/3ML) 0.083% IN NEBU
5.0000 mg | INHALATION_SOLUTION | Freq: Once | RESPIRATORY_TRACT | Status: AC
  Administered 2015-02-13: 5 mg via RESPIRATORY_TRACT
  Filled 2015-02-13: qty 6

## 2015-02-13 NOTE — ED Notes (Signed)
MD at bedside. 

## 2015-02-13 NOTE — Discharge Instructions (Signed)
Continue to stay well-hydrated. Gargle warm salt water and spit it out. Continue to alternate between Tylenol and Ibuprofen for pain or fever. Use Mucinex for cough suppression/expectoration of mucus. Use netipot and flonase to help with nasal congestion. May consider over-the-counter Benadryl or other antihistamine to decrease secretions and for watery itchy eyes. Use your home nebulizer as directed for shortness of breath/wheezing/cough. Take prednisone as directed, starting tomorrow. Start taking antibiotics as directed and until completed. Followup with your primary care doctor in 5-7 days for recheck of ongoing symptoms. Return to emergency department for emergent changing or worsening of symptoms.   Cough, Adult A cough helps to clear your throat and lungs. A cough may last only 2-3 weeks (acute), or it may last longer than 8 weeks (chronic). Many different things can cause a cough. A cough may be a sign of an illness or another medical condition. HOME CARE  Pay attention to any changes in your cough.  Take medicines only as told by your doctor.  If you were prescribed an antibiotic medicine, take it as told by your doctor. Do not stop taking it even if you start to feel better.  Talk with your doctor before you try using a cough medicine.  Drink enough fluid to keep your pee (urine) clear or pale yellow.  If the air is dry, use a cold steam vaporizer or humidifier in your home.  Stay away from things that make you cough at work or at home.  If your cough is worse at night, try using extra pillows to raise your head up higher while you sleep.  Do not smoke, and try not to be around smoke. If you need help quitting, ask your doctor.  Do not have caffeine.  Do not drink alcohol.  Rest as needed. GET HELP IF:  You have new problems (symptoms).  You cough up yellow fluid (pus).  Your cough does not get better after 2-3 weeks, or your cough gets worse.  Medicine does not help  your cough and you are not sleeping well.  You have pain that gets worse or pain that is not helped with medicine.  You have a fever.  You are losing weight and you do not know why.  You have night sweats. GET HELP RIGHT AWAY IF:  You cough up blood.  You have trouble breathing.  Your heartbeat is very fast.   This information is not intended to replace advice given to you by your health care provider. Make sure you discuss any questions you have with your health care provider.   Document Released: 11/15/2010 Document Revised: 11/23/2014 Document Reviewed: 05/11/2014 Elsevier Interactive Patient Education 2016 [employer]  Asthma, Acute Bronchospasm Acute bronchospasm caused by asthma is also referred to as an asthma attack. Bronchospasm means your air passages become narrowed. The narrowing is caused by inflammation and tightening of the muscles in the air tubes (bronchi) in your lungs. This can make it hard to breathe or cause you to wheeze and cough. CAUSES Possible triggers are:  Animal dander from the skin, hair, or feathers of animals.  Dust mites contained in house dust.  Cockroaches.  Pollen from trees or grass.  Mold.  Cigarette or tobacco smoke.  Air pollutants such as dust, household cleaners, hair sprays, aerosol sprays, paint fumes, strong chemicals, or strong odors.  Cold air or weather changes. Cold air may trigger inflammation. Winds increase molds and pollens in the air.  Strong emotions such as crying or laughing  hard.  Stress.  Certain medicines such as aspirin or beta-blockers.  Sulfites in foods and drinks, such as dried fruits and wine.  Infections or inflammatory conditions, such as a flu, cold, or inflammation of the nasal membranes (rhinitis).  Gastroesophageal reflux disease (GERD). GERD is a condition where stomach acid backs up into your esophagus.  Exercise or strenuous activity. SIGNS AND SYMPTOMS   Wheezing.  Excessive  coughing, particularly at night.  Chest tightness.  Shortness of breath. DIAGNOSIS  Your health care provider will ask you about your medical history and perform a physical exam. A chest X-ray or blood testing may be performed to look for other causes of your symptoms or other conditions that may have triggered your asthma attack. TREATMENT  Treatment is aimed at reducing inflammation and opening up the airways in your lungs. Most asthma attacks are treated with inhaled medicines. These include quick relief or rescue medicines (such as bronchodilators) and controller medicines (such as inhaled corticosteroids). These medicines are sometimes given through an inhaler or a nebulizer. Systemic steroid medicine taken by mouth or given through an IV tube also can be used to reduce the inflammation when an attack is moderate or severe. Antibiotic medicines are only used if a bacterial infection is present.  HOME CARE INSTRUCTIONS   Rest.  Drink plenty of liquids. This helps the mucus to remain thin and be easily coughed up. Only use caffeine in moderation and do not use alcohol until you have recovered from your illness.  Do not smoke. Avoid being exposed to secondhand smoke.  You play a critical role in keeping yourself in good health. Avoid exposure to things that cause you to wheeze or to have breathing problems.  Keep your medicines up-to-date and available. Carefully follow your health care provider's treatment plan.  Take your medicine exactly as prescribed.  When pollen or pollution is bad, keep windows closed and use an air conditioner or go to places with air conditioning.  Asthma requires careful medical care. See your health care provider for a follow-up as advised. If you are more than [redacted] weeks pregnant and you were prescribed any new medicines, let your obstetrician know about the visit and how you are doing. Follow up with your health care provider as directed.  After you have  recovered from your asthma attack, make an appointment with your outpatient doctor to talk about ways to reduce the likelihood of future attacks. If you do not have a doctor who manages your asthma, make an appointment with a primary care doctor to discuss your asthma. SEEK IMMEDIATE MEDICAL CARE IF:   You are getting worse.  You have trouble breathing. If severe, call your local emergency services (911 in the [place]  You develop chest pain or discomfort.  You are vomiting.  You are not able to keep fluids down.  You are coughing up yellow, green, brown, or bloody sputum.  You have a fever and your symptoms suddenly get worse.  You have trouble swallowing. MAKE SURE YOU:   Understand these instructions.  Will watch your condition.  Will get help right away if you are not doing well or get worse.   This information is not intended to replace advice given to you by your health care provider. Make sure you discuss any questions you have with your health care provider.   Document Released: 06/19/2006 Document Revised: 03/09/2013 Document Reviewed: 09/09/2012 Elsevier Interactive Patient Education 2016 [employer]  Upper Respiratory Infection, Adult Most upper respiratory  infections (URIs) are a viral infection of the air passages leading to the lungs. A URI affects the nose, throat, and upper air passages. The most common type of URI is nasopharyngitis and is typically referred to as "the common cold." URIs run their course and usually go away on their own. Most of the time, a URI does not require medical attention, but sometimes a bacterial infection in the upper airways can follow a viral infection. This is called a secondary infection. Sinus and middle ear infections are common types of secondary upper respiratory infections. Bacterial pneumonia can also complicate a URI. A URI can worsen asthma and chronic obstructive pulmonary disease (COPD). Sometimes, these complications can  require emergency medical care and may be life threatening.  CAUSES Almost all URIs are caused by viruses. A virus is a type of germ and can spread from one person to another.  RISKS FACTORS You may be at risk for a URI if:   You smoke.   You have chronic heart or lung disease.  You have a weakened defense (immune) system.   You are very young or very old.   You have nasal allergies or asthma.  You work in crowded or poorly ventilated areas.  You work in health care facilities or schools. SIGNS AND SYMPTOMS  Symptoms typically develop 2-3 days after you come in contact with a cold virus. Most viral URIs last 7-10 days. However, viral URIs from the influenza virus (flu virus) can last 14-18 days and are typically more severe. Symptoms may include:   Runny or stuffy (congested) nose.   Sneezing.   Cough.   Sore throat.   Headache.   Fatigue.   Fever.   Loss of appetite.   Pain in your forehead, behind your eyes, and over your cheekbones (sinus pain).  Muscle aches.  DIAGNOSIS  Your health care provider may diagnose a URI by:  Physical exam.  Tests to check that your symptoms are not due to another condition such as:  Strep throat.  Sinusitis.  Pneumonia.  Asthma. TREATMENT  A URI goes away on its own with time. It cannot be cured with medicines, but medicines may be prescribed or recommended to relieve symptoms. Medicines may help:  Reduce your fever.  Reduce your cough.  Relieve nasal congestion. HOME CARE INSTRUCTIONS   Take medicines only as directed by your health care provider.   Gargle warm saltwater or take cough drops to comfort your throat as directed by your health care provider.  Use a warm mist humidifier or inhale steam from a shower to increase air moisture. This may make it easier to breathe.  Drink enough fluid to keep your urine clear or pale yellow.   Eat soups and other clear broths and maintain good nutrition.    Rest as needed.   Return to work when your temperature has returned to normal or as your health care provider advises. You may need to stay home longer to avoid infecting others. You can also use a face mask and careful hand washing to prevent spread of the virus.  Increase the usage of your inhaler if you have asthma.   Do not use any tobacco products, including cigarettes, chewing tobacco, or electronic cigarettes. If you need help quitting, ask your health care provider. PREVENTION  The best way to protect yourself from getting a cold is to practice good hygiene.   Avoid oral or hand contact with people with cold symptoms.   Wash your hands  often if contact occurs.  There is no clear evidence that vitamin C, vitamin E, echinacea, or exercise reduces the chance of developing a cold. However, it is always recommended to get plenty of rest, exercise, and practice good nutrition.  SEEK MEDICAL CARE IF:   You are getting worse rather than better.   Your symptoms are not controlled by medicine.   You have chills.  You have worsening shortness of breath.  You have brown or red mucus.  You have yellow or brown nasal discharge.  You have pain in your face, especially when you bend forward.  You have a fever.  You have swollen neck glands.  You have pain while swallowing.  You have white areas in the back of your throat. SEEK IMMEDIATE MEDICAL CARE IF:   You have severe or persistent:  Headache.  Ear pain.  Sinus pain.  Chest pain.  You have chronic lung disease and any of the following:  Wheezing.  Prolonged cough.  Coughing up blood.  A change in your usual mucus.  You have a stiff neck.  You have changes in your:  Vision.  Hearing.  Thinking.  Mood. MAKE SURE YOU:   Understand these instructions.  Will watch your condition.  Will get help right away if you are not doing well or get worse.   This information is not intended to  replace advice given to you by your health care provider. Make sure you discuss any questions you have with your health care provider.   Document Released: 08/28/2000 Document Revised: 07/19/2014 Document Reviewed: 06/09/2013 Elsevier Interactive Patient Education 2016 [employer]  Community-Acquired Pneumonia, Adult Pneumonia is an infection of the lungs. One type of pneumonia can happen while a person is in a hospital. A different type can happen when a person is not in a hospital (community-acquired pneumonia). It is easy for this kind to spread from person to person. It can spread to you if you breathe near an infected person who coughs or sneezes. Some symptoms include:  A dry cough.  A wet (productive) cough.  Fever.  Sweating.  Chest pain. HOME CARE  Take over-the-counter and prescription medicines only as told by your doctor.  Only take cough medicine if you are losing sleep.  If you were prescribed an antibiotic medicine, take it as told by your doctor. Do not stop taking the antibiotic even if you start to feel better.  Sleep with your head and neck raised (elevated). You can do this by putting a few pillows under your head, or you can sleep in a recliner.  Do not use tobacco products. These include cigarettes, chewing tobacco, and e-cigarettes. If you need help quitting, ask your doctor.  Drink enough water to keep your pee (urine) clear or pale yellow. A shot (vaccine) can help prevent pneumonia. Shots are often suggested for:  People older than 20 years of age.  People older than 20 years of age:  Who are having cancer treatment.  Who have long-term (chronic) lung disease.  Who have problems with their body's defense system (immune system). You may also prevent pneumonia if you take these actions:  Get the flu (influenza) shot every year.  Go to the dentist as often as told.  Wash your hands often. If soap and water are not available, use hand  sanitizer. GET HELP IF:  You have a fever.  You lose sleep because your cough medicine does not help. GET HELP RIGHT AWAY IF:  You are  short of breath and it gets worse.  You have more chest pain.  Your sickness gets worse. This is very serious if:  You are an older adult.  Your body's defense system is weak.  You cough up blood.   This information is not intended to replace advice given to you by your health care provider. Make sure you discuss any questions you have with your health care provider.   Document Released: 08/21/2007 Document Revised: 11/23/2014 Document Reviewed: 06/29/2014 Elsevier Interactive Patient Education [employer]

## 2015-02-13 NOTE — Telephone Encounter (Signed)
I called and talked to Mom. She said that Lori Miles's dentist recommended cleanings for her every 3 months because of her inability to adequately perform oral hygiene and because of her medical conditions. Mom asked for a note to support this so that her insurance will pay for it. I told Mom that I would write the note and that she could pick it up tomorrow. TG

## 2015-02-13 NOTE — ED Provider Notes (Signed)
CSN: [id number]     Arrival date & time 02/13/15  4098 History   First MD Initiated Contact with Patient 02/13/15 0610     No chief complaint on file.    (Consider location/radiation/quality/duration/timing/severity/associated sxs/prior Treatment) HPI Comments: Lori Miles is a 19 y.o. female with a PMHx of asthma, bronchitis, autism, CP/mental deficiency, seizures, and interstitial cystitis, who presents to the ED with complaints of URI symptoms 3 weeks which of gradually worsened over the last several days. Patient's mother provides most of the history due to patient's mental retardation. Patient's mother states that she has had rhinorrhea, cough with yellow sputum production, and complained of a sore throat for the last several weeks, and recently she began wheezing more and complained of shortness of breath. Symptoms worsen at night and with being outside, and has improved somewhat with Triaminic and her nebulizer at home. She denies any known sick contacts. She denies any fevers, chills, ear pain or drainage, drooling, trismus, eye redness, chest pain, abdominal pain, nausea, vomiting, or hematuria. LEVEL 5 CAVEAT: Pt with mental deficiency which limits ROS/history. [name] states that every year at this time of year she gets sick and usually requires an antibiotic because it "goes on for so long".  Patient is a 19 y.o. female presenting with URI. The history is provided by the patient. No language interpreter was used.  URI Presenting symptoms: cough, rhinorrhea and sore throat   Presenting symptoms: no ear pain and no fever   Severity:  Moderate Onset quality:  Gradual Duration:  3 weeks Timing:  Constant Progression:  Worsening Chronicity:  New Relieved by:  OTC medications and nebulizer treatments Exacerbated by: at night and with being outside. Ineffective treatments:  None tried Associated symptoms: wheezing   Associated symptoms: no arthralgias and no myalgias   Risk  factors: no diabetes mellitus, no immunosuppression and no sick contacts     Past Medical History  Diagnosis Date  . CP (cerebral palsy)     mom denies  . Asthma   . Mental deficiency     hx of aggressive behavior,impaired speech   . Constipation   . Allergy   . Autism     mom states no actual dx, but admits she has some symptoms  . Frequency-urgency syndrome      some incontinence ; wears pull-ups  . Seizure     most recent sz " at the end of summer"  . Interstitial cystitis   . Bronchitis    Past Surgical History  Procedure Laterality Date  . Cystoscopy    . Cystoscopy with hydrodistension and biopsy  04/14/2012    Procedure: CYSTOSCOPY/BIOPSY/HYDRODISTENSION;  Surgeon: Lindaann Slough, MD;  Location: [facility] ;  Service: Urology;;  instillation of marcaine and pyridium   Family History  Problem Relation Age of Onset  . Seizures Mother   . Seizures Sister     1 sister has   Social History  Substance Use Topics  . Smoking status: Never Smoker   . Smokeless tobacco: Never Used  . Alcohol Use: No   OB History    No data available      LEVEL 5 CAVEAT: Pt with mental deficiency which limits ROS/history Review of Systems  Unable to perform ROS: Other  Constitutional: Negative for fever and chills.  HENT: Positive for rhinorrhea and sore throat. Negative for drooling, ear discharge, ear pain and trouble swallowing.   Eyes: Negative for redness.  Respiratory: Positive for cough, shortness of breath  and wheezing.   Cardiovascular: Negative for chest pain.  Gastrointestinal: Negative for nausea, vomiting and abdominal pain.  Genitourinary: Negative for hematuria.  Musculoskeletal: Negative for myalgias and arthralgias.  Skin: Negative for color change.  Allergic/Immunologic: Negative for immunocompromised state.     Allergies  Abilify; Seroquel; Amoxicillin-pot clavulanate; and Keppra  Home Medications   Prior to Admission medications    Medication Sig Start Date End Date Taking? Authorizing Provider  albuterol (PROVENTIL) (2.5 MG/3ML) 0.083% nebulizer solution Take 2.5 mg by nebulization every 6 (six) hours as needed for wheezing or shortness of breath (wheezing).     Historical Provider, MD  budesonide (PULMICORT) 0.5 MG/2ML nebulizer solution Take 2 mLs (0.5 mg total) by nebulization 2 (two) times daily. Per Dr. Willa Rough, Allergist 01/17/15   Roselyn Kara Mead, MD  cetirizine (ZYRTEC) 10 MG tablet Take 10 mg by mouth daily.  08/07/11   Historical Provider, MD  cloNIDine (CATAPRES) 0.1 MG tablet Take 1 tab by mouth at bedtime. 11/16/14   Deetta Perla, MD  Cranberry 500 MG CAPS Take 500 mg by mouth daily.    Historical Provider, MD  DEPAKOTE 500 MG DR tablet Take 1 tablet twice per day 08/31/14   Elveria Rising, NP  fluconazole (DIFLUCAN) 100 MG tablet take 1 tablet by mouth once daily REPEAT IN 48-72 HOURS 12/21/14   Rachelle A Denney, CNM  hyoscyamine (LEVSIN SL) 0.125 MG SL tablet DISSOLVE 1 TABLET UNDER THE TONGUE 2 TIMES A DAY 08/08/14   Historical Provider, MD  Levonorgestrel-Ethinyl Estradiol (SEASONIQUE) 0.15-0.03 &0.01 MG tablet Take 1 tablet by mouth daily. Take 1 active tablet daily, skip non active pills. 10/27/13   Antionette Char, MD  Melatonin 3 MG TABS Take 3 mg by mouth at bedtime. For insomnia    Historical Provider, MD  montelukast (SINGULAIR) 10 MG tablet Take 1 tablet (10 mg total) by mouth at bedtime. 12/20/14   Roselyn Kara Mead, MD  montelukast (SINGULAIR) 5 MG chewable tablet Chew 5 mg by mouth daily.    Historical Provider, MD  Multiple Vitamin (MULTIVITAMIN WITH MINERALS) TABS tablet Take 1 tablet by mouth daily.    Historical Provider, MD  nitrofurantoin, macrocrystal-monohydrate, (MACROBID) 100 MG capsule Take 1 capsule (100 mg total) by mouth 2 (two) times daily. 11/21/14   Hayden Rasmussen, NP  Olopatadine HCl 0.2 % SOLN Place 1 drop into both eyes daily.    Historical Provider, MD  ondansetron (ZOFRAN ODT) 8 MG  disintegrating tablet Take 1 tablet (8 mg total) by mouth every 8 (eight) hours as needed for nausea or vomiting. Patient not taking: Reported on 12/16/2014 07/18/14   Tatyana Kirichenko, PA-C  ondansetron (ZOFRAN) 4 MG tablet Take 1 tablet (4 mg total) by mouth every 6 (six) hours. Patient taking differently: Take 4 mg by mouth every 8 (eight) hours as needed.  06/01/14   Tiffany Neva Seat, PA-C  phenazopyridine (PYRIDIUM) 200 MG tablet Take 1 tablet (200 mg total) by mouth 3 (three) times daily. Patient not taking: Reported on 12/16/2014 07/18/14   Lemont Fillers Kirichenko, PA-C  predniSONE (DELTASONE) 10 MG tablet Take 5 tablets (50 mg total) by mouth daily. Patient not taking: Reported on 12/16/2014 10/20/14   [name], MD  senna (SENOKOT) 8.6 MG TABS tablet Take 1 tablet (8.6 mg total) by mouth daily. 09/08/13 11/02/14  Jon Gills, MD  traZODone (DESYREL) 50 MG tablet Take 2 tabs by mouth at bedtime 08/31/14   Elveria Rising, NP  trimethoprim (TRIMPEX) 100 MG tablet Take 100  mg by mouth every morning.     Historical Provider, MD  vitamin C (ASCORBIC ACID) 500 MG tablet Take 500 mg by mouth daily.    Historical Provider, MD   Triage VS: BP 101/70 mmHg  Pulse 118  Temp(Src) 98.3 F (36.8 C) (Oral)  Resp 18  Ht 4\' 9"  (1.448 m)  Wt 79.379 kg  BMI 37.86 kg/m2  SpO2 96% Exam VS: BP 105/68 mmHg  Pulse 98  Temp(Src) 98.2 F (36.8 C) (Oral)  Resp 18  Ht 4\' 9"  (1.448 m)  Wt 79.379 kg  BMI 37.86 kg/m2  SpO2 100%  Physical Exam  Constitutional: She is oriented to person, place, and time. Vital signs are normal. She appears well-developed and well-nourished.  Non-toxic appearance. No distress.  Afebrile, nontoxic, NAD  HENT:  Head: Normocephalic and atraumatic.  Right Ear: Hearing, tympanic membrane, external ear and ear canal normal.  Left Ear: Hearing, tympanic membrane, external ear and ear canal normal.  Nose: Mucosal edema and rhinorrhea present.  Mouth/Throat: Uvula is midline, oropharynx is  clear and moist and mucous membranes are normal. No trismus in the jaw. No uvula swelling.  Ears are clear bilaterally. Nose with mild mucosal edema and clear rhinorrhea. Oropharynx clear and moist, without uvular swelling or deviation, no trismus or drooling, no tonsillar swelling or erythema, no exudates.    Eyes: Conjunctivae and EOM are normal. Right eye exhibits no discharge. Left eye exhibits no discharge.  Neck: Normal range of motion. Neck supple.  Cardiovascular: Normal rate, regular rhythm, normal heart sounds and intact distal pulses.  Exam reveals no gallop and no friction rub.   No murmur heard. Pulmonary/Chest: Effort normal. No respiratory distress. She has no decreased breath sounds. She has wheezes. She has no rhonchi. She has no rales.  Expiratory wheezing mostly noted in lower lung fields bilaterally without obvious rales/rhonchi although difficult to assess due to poor inspiratory effort, no hypoxia or increased WOB, speaking in full sentences, SpO2 100% on RA   Abdominal: Soft. Normal appearance and bowel sounds are normal. She exhibits no distension. There is no tenderness. There is no rigidity, no rebound and no guarding.  Musculoskeletal: Normal range of motion.  Lymphadenopathy:       Head (right side): No submandibular and no tonsillar adenopathy present.       Head (left side): No submandibular and no tonsillar adenopathy present.    She has no cervical adenopathy.  No head/neck LAD  Neurological: She is alert and oriented to person, place, and time. She has normal strength. No sensory deficit.  Skin: Skin is warm, dry and intact. No rash noted.  Psychiatric: She has a normal mood and affect.  Nursing note and vitals reviewed.   ED Course  Procedures (including critical care time) Labs Review Labs Reviewed - No data to display  Imaging Review Dg Chest 2 View  02/13/2015  CLINICAL DATA:  Cough for 3 weeks, worse today. EXAM: CHEST  2 VIEW COMPARISON:   11/07/2014. FINDINGS: Trachea is midline. Heart size normal. There is mild accentuation of the basilar pulmonary markings, which appears unchanged over several prior exams. However, there may be new airspace opacification in the right lower lobe. No airspace consolidation or pleural fluid. IMPRESSION: Suspect new airspace opacification in the right lower lobe, worrisome for pneumonia. Electronically Signed   By: Leanna Battles M.D.   On: 02/13/2015 07:23   I have personally reviewed and evaluated these images and lab results as part of my medical  decision-making.   EKG Interpretation None      MDM   Final diagnoses:  Cough  Asthma exacerbation  Wheezing  Shortness of breath  URI (upper respiratory infection)  CAP (community acquired pneumonia)    19 y.o. female here with URI symptoms 3 weeks which are gradually worsening. Pt is afebrile with Mild rhinorrhea, slight wheezing, throat clear. Will obtain CXR given that pt gives poor inspiratory effort, difficult to assess any areas of consolidation on lung exam. Likely viral URI but could be asthma exacerbation/bronchitis and given duration of symptoms she may benefit from abx. Will give nebs, prednisone, and obtain CXR then reassess shortly.  7:33 AM Lung sounds improved after nebulizer. CXR reveals new opacity in RLL, worrisome for PNA. Will treat with doxycycline. Will start on prednisone. Discussed use of nebulizer at home. F/up with PCP in 1wk. I explained the diagnosis and have given explicit precautions to return to the ER including for any other new or worsening symptoms. The patient and her mother understand and accept the medical plan as it's been dictated and I have answered their questions. Discharge instructions concerning home care and prescriptions have been given. The patient is STABLE and is discharged to home in good condition.   BP 105/68 mmHg  Pulse 98  Temp(Src) 98.2 F (36.8 C) (Oral)  Resp 18  Ht 4\' 9"  (1.448 m)   Wt 79.379 kg  BMI 37.86 kg/m2  SpO2 100%  Meds ordered this encounter  Medications  . predniSONE (DELTASONE) tablet 60 mg    Sig:   . albuterol (PROVENTIL) (2.5 MG/3ML) 0.083% nebulizer solution 5 mg    Sig:   . ipratropium (ATROVENT) nebulizer solution 0.5 mg    Sig:   . predniSONE (DELTASONE) 20 MG tablet    Sig: 3 tabs po daily x 3 days    Dispense:  9 tablet    Refill:  0    Order Specific Question:  Supervising Provider    Answer:  MILLER, BRIAN [[id number]]  . doxycycline (VIBRAMYCIN) 100 MG capsule    Sig: Take 1 capsule (100 mg total) by mouth 2 (two) times daily. One po bid x 7 days    Dispense:  14 capsule    Refill:  0    Order Specific Question:  Supervising Provider    Answer:  Eber HongMILLER, BRIAN [[id number]]     Aleigha Gilani Camprubi-Soms, PA-C 02/13/15 [id number]  Derwood KaplanAnkit Nanavati, MD 02/13/15 [phone]

## 2015-02-13 NOTE — Telephone Encounter (Signed)
Letter written and sent to Dr Sharene SkeansHickling for signature. Mom will pick up in the AM. TG

## 2015-02-13 NOTE — ED Notes (Signed)
Mother states that pt is now coughing up yellowish green phlegm, pt took prednisone without difficulty.

## 2015-02-13 NOTE — Telephone Encounter (Signed)
Patient's mother called and left a voicemail stating that she needs Lori Miles to call her back in regards to questions that she has for her.  CB: [phone]

## 2015-02-13 NOTE — ED Notes (Signed)
Mother states pt has been coughing for 3 weeks now and she has started wheezing now. Pt got to the point that when she laid down that she was short of breath.

## 2015-02-14 ENCOUNTER — Observation Stay (HOSPITAL_COMMUNITY)
Admission: EM | Admit: 2015-02-14 | Discharge: 2015-02-15 | Disposition: A | Discharge status: Home / Self Care | Payer: Medicare Other | Attending: Internal Medicine | Admitting: Internal Medicine

## 2015-02-14 ENCOUNTER — Encounter (HOSPITAL_COMMUNITY): Payer: Self-pay | Admitting: Emergency Medicine

## 2015-02-14 DIAGNOSIS — R112 Nausea with vomiting, unspecified: Secondary | ICD-10-CM | POA: Diagnosis present

## 2015-02-14 DIAGNOSIS — J9601 Acute respiratory failure with hypoxia: Secondary | ICD-10-CM | POA: Diagnosis not present

## 2015-02-14 DIAGNOSIS — J45909 Unspecified asthma, uncomplicated: Secondary | ICD-10-CM | POA: Diagnosis present

## 2015-02-14 DIAGNOSIS — J189 Pneumonia, unspecified organism: Principal | ICD-10-CM | POA: Insufficient documentation

## 2015-02-14 DIAGNOSIS — Z79899 Other long term (current) drug therapy: Secondary | ICD-10-CM | POA: Insufficient documentation

## 2015-02-14 DIAGNOSIS — G40909 Epilepsy, unspecified, not intractable, without status epilepticus: Secondary | ICD-10-CM | POA: Diagnosis not present

## 2015-02-14 DIAGNOSIS — N3010 Interstitial cystitis (chronic) without hematuria: Secondary | ICD-10-CM | POA: Diagnosis not present

## 2015-02-14 DIAGNOSIS — F840 Autistic disorder: Secondary | ICD-10-CM | POA: Diagnosis not present

## 2015-02-14 DIAGNOSIS — K5909 Other constipation: Secondary | ICD-10-CM | POA: Insufficient documentation

## 2015-02-14 DIAGNOSIS — E669 Obesity, unspecified: Secondary | ICD-10-CM | POA: Diagnosis not present

## 2015-02-14 DIAGNOSIS — F71 Moderate intellectual disabilities: Secondary | ICD-10-CM | POA: Diagnosis not present

## 2015-02-14 DIAGNOSIS — Z6837 Body mass index (BMI) 37.0-37.9, adult: Secondary | ICD-10-CM | POA: Diagnosis not present

## 2015-02-14 DIAGNOSIS — R6250 Unspecified lack of expected normal physiological development in childhood: Secondary | ICD-10-CM | POA: Insufficient documentation

## 2015-02-14 DIAGNOSIS — J9600 Acute respiratory failure, unspecified whether with hypoxia or hypercapnia: Secondary | ICD-10-CM | POA: Diagnosis not present

## 2015-02-14 DIAGNOSIS — G809 Cerebral palsy, unspecified: Secondary | ICD-10-CM | POA: Insufficient documentation

## 2015-02-14 DIAGNOSIS — J449 Chronic obstructive pulmonary disease, unspecified: Secondary | ICD-10-CM | POA: Diagnosis present

## 2015-02-14 DIAGNOSIS — Z8709 Personal history of other diseases of the respiratory system: Secondary | ICD-10-CM

## 2015-02-14 DIAGNOSIS — J181 Lobar pneumonia, unspecified organism: Secondary | ICD-10-CM

## 2015-02-14 DIAGNOSIS — Z7951 Long term (current) use of inhaled steroids: Secondary | ICD-10-CM | POA: Insufficient documentation

## 2015-02-14 DIAGNOSIS — R05 Cough: Secondary | ICD-10-CM | POA: Diagnosis present

## 2015-02-14 HISTORY — DX: Personal history of other diseases of the respiratory system: Z87.09

## 2015-02-14 LAB — CBC WITH DIFFERENTIAL/PLATELET
BASOS PCT: 0 %
Basophils Absolute: 0 10*3/uL (ref 0.0–0.1)
EOS ABS: 0 10*3/uL (ref 0.0–0.7)
Eosinophils Relative: 0 %
HEMATOCRIT: 36.8 % (ref 36.0–46.0)
HEMOGLOBIN: 11.9 g/dL — AB (ref 12.0–15.0)
LYMPHS ABS: 1.6 10*3/uL (ref 0.7–4.0)
Lymphocytes Relative: 16 %
MCH: 29.5 pg (ref 26.0–34.0)
MCHC: 32.3 g/dL (ref 30.0–36.0)
MCV: 91.3 fL (ref 78.0–100.0)
MONO ABS: 0.5 10*3/uL (ref 0.1–1.0)
MONOS PCT: 4 %
NEUTROS PCT: 80 %
Neutro Abs: 8.3 10*3/uL — ABNORMAL HIGH (ref 1.7–7.7)
Platelets: 290 10*3/uL (ref 150–400)
RBC: 4.03 MIL/uL (ref 3.87–5.11)
RDW: 13.3 % (ref 11.5–15.5)
WBC: 10.4 10*3/uL (ref 4.0–10.5)

## 2015-02-14 LAB — COMPREHENSIVE METABOLIC PANEL
ALBUMIN: 3 g/dL — AB (ref 3.5–5.0)
ALT: 42 U/L (ref 14–54)
ANION GAP: 10 (ref 5–15)
AST: 37 U/L (ref 15–41)
Alkaline Phosphatase: 41 U/L (ref 38–126)
BUN: 9 mg/dL (ref 6–20)
CO2: 23 mmol/L (ref 22–32)
Calcium: 9.2 mg/dL (ref 8.9–10.3)
Chloride: 106 mmol/L (ref 101–111)
Creatinine, Ser: 0.86 mg/dL (ref 0.44–1.00)
GFR calc Af Amer: >60 mL/min (ref >60)
GFR calc non Af Amer: >60 mL/min (ref >60)
GLUCOSE: 128 mg/dL — AB (ref 65–99)
POTASSIUM: 4.4 mmol/L (ref 3.5–5.1)
SODIUM: 139 mmol/L (ref 135–145)
TOTAL PROTEIN: 6.5 g/dL (ref 6.5–8.1)
Total Bilirubin: 0.2 mg/dL — ABNORMAL LOW (ref 0.3–1.2)

## 2015-02-14 LAB — INFLUENZA PANEL BY PCR (TYPE A & B)
H1N1FLUPCR: NOT DETECTED
INFLAPCR: NEGATIVE
Influenza B By PCR: NEGATIVE

## 2015-02-14 MED ORDER — DIVALPROEX SODIUM 500 MG PO DR TAB
500.0000 mg | DELAYED_RELEASE_TABLET | Freq: Two times a day (BID) | ORAL | Status: DC
  Filled 2015-02-14 (×3): qty 1

## 2015-02-14 MED ORDER — ACETAMINOPHEN 650 MG RE SUPP
650.0000 mg | RECTAL | Status: DC | PRN
  Administered 2015-02-15: 650 mg via RECTAL
  Filled 2015-02-14: qty 1

## 2015-02-14 MED ORDER — SODIUM CHLORIDE 0.9 % IV BOLUS (SEPSIS)
1000.0000 mL | Freq: Once | INTRAVENOUS | Status: AC
  Administered 2015-02-14: 1000 mL via INTRAVENOUS

## 2015-02-14 MED ORDER — LORATADINE 10 MG PO TABS
10.0000 mg | ORAL_TABLET | Freq: Every day | ORAL | Status: DC
  Filled 2015-02-14: qty 1

## 2015-02-14 MED ORDER — DEXTROSE 5 % IV SOLN
1.0000 g | INTRAVENOUS | Status: DC
  Administered 2015-02-14: 1 g via INTRAVENOUS
  Filled 2015-02-14 (×3): qty 10

## 2015-02-14 MED ORDER — IPRATROPIUM-ALBUTEROL 0.5-2.5 (3) MG/3ML IN SOLN
RESPIRATORY_TRACT | Status: AC
  Administered 2015-02-14: 3 mL via RESPIRATORY_TRACT
  Filled 2015-02-14: qty 3

## 2015-02-14 MED ORDER — MONTELUKAST SODIUM 10 MG PO TABS: 10.0000 mg | ORAL_TABLET | Freq: Every day | ORAL | Status: DC

## 2015-02-14 MED ORDER — ACETAMINOPHEN 325 MG PO TABS: 650.0000 mg | ORAL_TABLET | ORAL | Status: DC | PRN

## 2015-02-14 MED ORDER — OLOPATADINE HCL 0.1 % OP SOLN
1.0000 [drp] | Freq: Two times a day (BID) | OPHTHALMIC | Status: DC
  Filled 2015-02-14: qty 5

## 2015-02-14 MED ORDER — ONDANSETRON 4 MG PO TBDP: 4.0000 mg | ORAL_TABLET | Freq: Three times a day (TID) | ORAL | Status: DC | PRN

## 2015-02-14 MED ORDER — LEVONORGEST-ETH ESTRAD 91-DAY 0.15-0.03 &0.01 MG PO TABS: 1.0000 | ORAL_TABLET | Freq: Every day | ORAL | Status: DC

## 2015-02-14 MED ORDER — ALBUTEROL SULFATE (2.5 MG/3ML) 0.083% IN NEBU: 2.5000 mg | INHALATION_SOLUTION | RESPIRATORY_TRACT | Status: DC | PRN

## 2015-02-14 MED ORDER — CLONIDINE HCL 0.1 MG PO TABS: 0.1000 mg | ORAL_TABLET | Freq: Every day | ORAL | Status: DC

## 2015-02-14 MED ORDER — SODIUM CHLORIDE 0.9 % IV SOLN: INTRAVENOUS | Status: DC

## 2015-02-14 MED ORDER — ONDANSETRON HCL 4 MG/2ML IJ SOLN: 4.0000 mg | Freq: Four times a day (QID) | INTRAMUSCULAR | Status: DC | PRN

## 2015-02-14 MED ORDER — TRAZODONE HCL 100 MG PO TABS: 100.0000 mg | ORAL_TABLET | Freq: Every day | ORAL | Status: DC

## 2015-02-14 MED ORDER — METHYLPREDNISOLONE SODIUM SUCC 40 MG IJ SOLR
40.0000 mg | Freq: Two times a day (BID) | INTRAMUSCULAR | Status: DC
  Administered 2015-02-15: 40 mg via INTRAVENOUS
  Filled 2015-02-14: qty 1

## 2015-02-14 MED ORDER — ONDANSETRON 4 MG PO TBDP
4.0000 mg | ORAL_TABLET | Freq: Once | ORAL | Status: AC
  Administered 2015-02-14: 4 mg via ORAL
  Filled 2015-02-14: qty 1

## 2015-02-14 MED ORDER — METHYLPREDNISOLONE SODIUM SUCC 125 MG IJ SOLR
60.0000 mg | Freq: Two times a day (BID) | INTRAMUSCULAR | Status: DC
  Administered 2015-02-14: 60 mg via INTRAVENOUS
  Filled 2015-02-14: qty 2

## 2015-02-14 MED ORDER — SENNA 8.6 MG PO TABS
1.0000 | ORAL_TABLET | Freq: Every day | ORAL | Status: DC
  Filled 2015-02-14: qty 1

## 2015-02-14 MED ORDER — IPRATROPIUM-ALBUTEROL 0.5-2.5 (3) MG/3ML IN SOLN
3.0000 mL | Freq: Once | RESPIRATORY_TRACT | Status: AC
  Administered 2015-02-14: 3 mL via RESPIRATORY_TRACT

## 2015-02-14 MED ORDER — SODIUM CHLORIDE 0.9 % IV SOLN
INTRAVENOUS | Status: DC
  Administered 2015-02-15: 03:00:00 via INTRAVENOUS

## 2015-02-14 MED ORDER — GUAIFENESIN 100 MG/5ML PO SOLN
5.0000 mL | ORAL | Status: DC | PRN
  Administered 2015-02-15: 100 mg via ORAL
  Filled 2015-02-14 (×2): qty 5

## 2015-02-14 MED ORDER — HYOSCYAMINE SULFATE 0.125 MG SL SUBL
0.1250 mg | SUBLINGUAL_TABLET | Freq: Two times a day (BID) | SUBLINGUAL | Status: DC
  Filled 2015-02-14 (×3): qty 1

## 2015-02-14 MED ORDER — FLUCONAZOLE 100 MG PO TABS: 100.0000 mg | ORAL_TABLET | Freq: Every day | ORAL | Status: DC | PRN

## 2015-02-14 MED ORDER — ALBUTEROL SULFATE (2.5 MG/3ML) 0.083% IN NEBU
2.5000 mg | INHALATION_SOLUTION | Freq: Four times a day (QID) | RESPIRATORY_TRACT | Status: DC
  Administered 2015-02-14 – 2015-02-15 (×3): 2.5 mg via RESPIRATORY_TRACT
  Filled 2015-02-14 (×3): qty 3

## 2015-02-14 MED ORDER — ENOXAPARIN SODIUM 40 MG/0.4ML ~~LOC~~ SOLN: 40.0000 mg | SUBCUTANEOUS | Status: DC

## 2015-02-14 MED ORDER — BUDESONIDE 1 MG/2ML IN SUSP: 1.0000 mg | Freq: Three times a day (TID) | RESPIRATORY_TRACT | Status: DC | PRN

## 2015-02-14 MED ORDER — ONDANSETRON HCL 4 MG/2ML IJ SOLN: 4.0000 mg | Freq: Three times a day (TID) | INTRAMUSCULAR | Status: DC | PRN

## 2015-02-14 MED ORDER — NITROFURANTOIN MONOHYD MACRO 100 MG PO CAPS
100.0000 mg | ORAL_CAPSULE | Freq: Two times a day (BID) | ORAL | Status: DC | PRN
  Filled 2015-02-14: qty 1

## 2015-02-14 MED ORDER — BUDESONIDE 0.5 MG/2ML IN SUSP
0.5000 mg | Freq: Two times a day (BID) | RESPIRATORY_TRACT | Status: DC
  Administered 2015-02-14 – 2015-02-15 (×2): 0.5 mg via RESPIRATORY_TRACT
  Filled 2015-02-14 (×2): qty 2

## 2015-02-14 MED ORDER — DEXTROSE 5 % IV SOLN
500.0000 mg | INTRAVENOUS | Status: DC
  Administered 2015-02-14: 500 mg via INTRAVENOUS
  Filled 2015-02-14: qty 500

## 2015-02-14 NOTE — ED Notes (Signed)
Patient brought to room via wheelchair with family in tow; patient getting undressed and into a gown at this time; warm blankets given

## 2015-02-14 NOTE — ED Notes (Signed)
Patient drinking sprite.  

## 2015-02-14 NOTE — ED Notes (Signed)
Contacted lab to advise of new blood orders in for the tubes they are holding.

## 2015-02-14 NOTE — Progress Notes (Signed)
Called and talked to Pharmacy about patient's home medications. Pharmacy is to send Lauren to come to patient's room and talk to patient's mom about her home medications. Will update MD after Pharmacist seen patient.

## 2015-02-14 NOTE — Progress Notes (Signed)
Patient on clear liquid diet and awaiting for SLP evaluation, patient's mom stated patient takes medications with apple sauce or pudding to help swallow. Patient also has history of seizure, patient's mom concerned that patient not able take her seizure medications with clear liquid. Merdis DelayK. Schorr NP paged and notified.

## 2015-02-14 NOTE — ED Notes (Signed)
PA back at bedside speaking with parents. Patient alert and remains in no distress. IV infusing

## 2015-02-14 NOTE — Progress Notes (Signed)
Asked by nurse to speak to patient's mother about home medications.  Mother ademant that she uses home medications because "you gave her the wrong thing before." Tells me that Lori Miles can only use brand name medications because "she gets sick from generic and that's what you give her." She also stated "you don't even have everything she takes"- I looked over her home list and what was reordered and informed family the only medication we did not carry is the birth control, but we have everything else.  Repeatedly tells me multiple people told her she could use home medication and [facility] evening melatonin, trazodone, clonidine and Depakote.  I asked her for the Depakote bottle in order to package a capsule for the patient to take in the morning. Mother then tells me she only brought medications for tonight and denies bringing any other medications to the patient's room. Also informs me she was told the patient would go home in the morning. I asked the mother when she brings medication from home in the morning to let the daytime RN know so pharmacy can be called to double-verify with the RN prior to medication being given. At this suggestion, mother becomes angry with me and insists "she is not going through all that when the doctor said she could use [name]'s home meds".  During our encounter, I repeatedly informed the mother that it is not hospital policy to use home medications when we have them in supply, and if home medications are used, they need to have a hospital barcode for proper documentation. Regrettably, she had already been told it was ok to use home medications and insisted that "the doctor gets to have a say" despite me reiterating that the doctors do not always know all of the pharmacy department's policies. Patient's mother also told me "I know the policy as well, I am not stupid" to which I reassured her that this [name] was simply trying to follow hospital policy in  order to practice proper legal and safety standards.  Our interaction ended with patient's mother asking RN Lori Miles to call Dr. Sunnie Nielsenegalado so she could talk to her about this.   [name], PharmD, BCPS Clinical Pharmacist Pager: [phone] 02/14/2015 8:36 PM

## 2015-02-14 NOTE — ED Notes (Signed)
Pt sts cough and asthma sx; seen here yesterday for same and diagnosed with PNA but mother sts not doing better

## 2015-02-14 NOTE — Progress Notes (Addendum)
RN came to patient's room and patient's mom stated she already gave patient her home medications, including Clonidine, Depakote, Trazodone, and Melatonin. RN explained again that pharmacy needs to verify home medications and MD needs to place order or her to do that, patient mom stated "They already told me I can do that downstairs." K. Schorr NP paged and aware.

## 2015-02-14 NOTE — Progress Notes (Signed)
During shift report, patient's mother said that she will give home medicine to her daughter because she doesn't trust what she is receiving from hospital and she would like to have some apple sauce to help with her meds. RN trying to explain to her that the patient is on clear liquids diet and she can't have apple sauce but she said she will do it anyway. Night shift RN informed MD. Will continue to monitor.

## 2015-02-14 NOTE — Progress Notes (Signed)
Patient's mom is upset after talking to Leotis ShamesLauren, pharmacist, and requests to see MD. Merdis DelayK. Schorr NP paged and notified. Merdis DelayK. Schorr NP called back, stated patient's mom needs to talk to MD in the morning since this is not an emergent situation. RN also explained to patient's mom that she needs to wait for at least 2 RNs to verify morning medications before given. Patient's mom stated "If you're not here by 7am in the morning, I'm going to give it to her. She's leaving in the morning so I don't have to talk to the doctor anyway." Patient's mom refused for RN to take medications from her bag and send it to pharmacy.

## 2015-02-14 NOTE — Progress Notes (Signed)
Patient trasfered from ED to [phone] via stretcher; alert and oriented x 1; no complaints of pain; IV saline locked in RAC running NS@100cc /hr; skin intact. Orient patient to room and unit;  gave patient care guide; instructed how to use the call bell and  fall risk precautions. Will continue to monitor the patient.

## 2015-02-14 NOTE — ED Notes (Signed)
Patient coughing on reassessment but in no acute distress, cough remains non-productive

## 2015-02-14 NOTE — H&P (Signed)
[facility] History and Physical                                                                                    Lori Miles, is a 19 y.o. female  MRN: [id number]   DOB - [birth year]  Admit Date - 02/14/2015  Outpatient Primary MD for the patient is Dorrene German, MD  Referring MD: Corlis Leak / ER  With History of -  Past Medical History  Diagnosis Date  . CP (cerebral palsy) (HCC)     mom denies  . Asthma   . Mental deficiency     hx of aggressive behavior,impaired speech   . Constipation   . Allergy   . Autism     mom states no actual dx, but admits she has some symptoms  . Frequency-urgency syndrome      some incontinence ; wears pull-ups  . Seizure (HCC)     most recent sz " at the end of summer"  . Interstitial cystitis   . Bronchitis       Past Surgical History  Procedure Laterality Date  . Cystoscopy    . Cystoscopy with hydrodistension and biopsy  04/14/2012    Procedure: CYSTOSCOPY/BIOPSY/HYDRODISTENSION;  Surgeon: Lindaann Slough, MD;  Location: [facility];  Service: Urology;;  instillation of marcaine and pyridium    in for   Chief Complaint  Patient presents with  . Cough     HPI This is a 19 year old female patient without Autism spectrum disorder/developmental delay, chronic interstitial cystitis, chronic constipation, seizure disorder, and asthma. Patient's mother's the primary historian and reports for about 3 weeks patient has had increased cough. Patient does have chronic wheezing and occasional coughing but she has been progressively worsening and now has green productive sputum. She was evaluated as an outpatient by primary care physician and started on antibiotics and steroids and nebs without any improvement in her symptoms. The patient has been experiencing nausea and vomiting specially after coughing episode. Because of persistent symptomatology and patient inability to keep medications down her mother brought her to  the ER for further evaluation.  In the ER the patient had low-grade fever 99.3, BP was stable at 114/73 pulse was 98 and regular respirations 20. Room air saturations were 98%. Chest x-ray completed on 11/28 revealed new airspace opacification in the right lower lobe worrisome for pneumonia. In the ER today the patient was given duo neb along with a liter of fluid and Zofran with improvement in her wheezing but patient continued to have issues with nausea after eating although she has not yet vomited since attempting solid food earlier today. Chest x-ray concerning for aspiration component especially with history of chronic wheezing and recurrent cough and what sounds like symptoms consistent with vocal cord dysfunction.   Review of Systems   In addition to the HPI above,  No Headache, changes with Vision or hearing, new weakness, tingling, numbness in any extremity, No problems swallowing food or Liquids, indigestion/reflux No Chest pain, Shortness of Breath, palpitations, orthopnea or DOE No Abdominal pain, melena or hematochezia, no dark tarry stools No dysuria, hematuria or flank pain No new skin rashes, lesions,  masses or bruises, No new joints pains-aches No recent weight gain or loss No polyuria, polydypsia or polyphagia,  *A full 10 point Review of Systems was done, except as stated above, all other Review of Systems were negative.  Social History Social History  Substance Use Topics  . Smoking status: Never Smoker   . Smokeless tobacco: Never Used  . Alcohol Use: No    Resides at: Private residence  Lives with: Parents  Ambulatory status: Without assistive devices   Family History Family History  Problem Relation Age of Onset  . Seizures Mother   . Seizures Sister     1 sister has     Prior to Admission medications   Medication Sig Start Date End Date Taking? Authorizing Provider  albuterol (PROVENTIL) (2.5 MG/3ML) 0.083% nebulizer solution Take 2.5 mg by  nebulization every 6 (six) hours as needed for wheezing or shortness of breath (wheezing).     Historical Provider, MD  budesonide (PULMICORT) 0.5 MG/2ML nebulizer solution Take 2 mLs (0.5 mg total) by nebulization 2 (two) times daily. Per Dr. Willa Rough, Allergist 01/17/15   Roselyn Kara Mead, MD  budesonide (PULMICORT) 1 MG/2ML nebulizer solution Take 2 mLs (1 mg total) by nebulization every 8 (eight) hours as needed. 02/14/15   Ace Gins Sam, PA-C  cetirizine (ZYRTEC) 10 MG tablet Take 10 mg by mouth daily.  08/07/11   Historical Provider, MD  cloNIDine (CATAPRES) 0.1 MG tablet Take 1 tab by mouth at bedtime. 11/16/14   Deetta Perla, MD  Cranberry 500 MG CAPS Take 500 mg by mouth daily.    Historical Provider, MD  DEPAKOTE 500 MG DR tablet Take 1 tablet twice per day 08/31/14   Elveria Rising, NP  doxycycline (VIBRAMYCIN) 100 MG capsule Take 1 capsule (100 mg total) by mouth 2 (two) times daily. One po bid x 7 days 02/13/15   Mercedes Camprubi-Soms, PA-C  fluconazole (DIFLUCAN) 100 MG tablet take 1 tablet by mouth once daily REPEAT IN 48-72 HOURS 12/21/14   Rachelle A Denney, CNM  hyoscyamine (LEVSIN SL) 0.125 MG SL tablet DISSOLVE 1 TABLET UNDER THE TONGUE 2 TIMES A DAY 08/08/14   Historical Provider, MD  Levonorgestrel-Ethinyl Estradiol (SEASONIQUE) 0.15-0.03 &0.01 MG tablet Take 1 tablet by mouth daily. Take 1 active tablet daily, skip non active pills. 10/27/13   Antionette Char, MD  Melatonin 3 MG TABS Take 3 mg by mouth at bedtime. For insomnia    Historical Provider, MD  montelukast (SINGULAIR) 10 MG tablet Take 1 tablet (10 mg total) by mouth at bedtime. 12/20/14   Roselyn Kara Mead, MD  montelukast (SINGULAIR) 5 MG chewable tablet Chew 5 mg by mouth daily.    Historical Provider, MD  Multiple Vitamin (MULTIVITAMIN WITH MINERALS) TABS tablet Take 1 tablet by mouth daily.    Historical Provider, MD  nitrofurantoin, macrocrystal-monohydrate, (MACROBID) 100 MG capsule Take 1 capsule (100 mg total) by  mouth 2 (two) times daily. 11/21/14   Hayden Rasmussen, NP  Olopatadine HCl 0.2 % SOLN Place 1 drop into both eyes daily.    Historical Provider, MD  ondansetron (ZOFRAN ODT) 4 MG disintegrating tablet Take 1 tablet (4 mg total) by mouth every 8 (eight) hours as needed for nausea or vomiting. 02/14/15   Ace Gins Sam, PA-C  ondansetron (ZOFRAN ODT) 8 MG disintegrating tablet Take 1 tablet (8 mg total) by mouth every 8 (eight) hours as needed for nausea or vomiting. Patient not taking: Reported on 12/16/2014 07/18/14   Jaynie Crumble,  PA-C  ondansetron (ZOFRAN) 4 MG tablet Take 1 tablet (4 mg total) by mouth every 6 (six) hours. Patient taking differently: Take 4 mg by mouth every 8 (eight) hours as needed.  06/01/14   Tiffany Neva SeatGreene, PA-C  phenazopyridine (PYRIDIUM) 200 MG tablet Take 1 tablet (200 mg total) by mouth 3 (three) times daily. Patient not taking: Reported on 12/16/2014 07/18/14   Lemont Fillersatyana Kirichenko, PA-C  predniSONE (DELTASONE) 10 MG tablet Take 5 tablets (50 mg total) by mouth daily. Patient not taking: Reported on 12/16/2014 10/20/14   [name] DivineJohn Wofford, MD  predniSONE (DELTASONE) 20 MG tablet 3 tabs po daily x 3 days 02/13/15   Mercedes Camprubi-Soms, PA-C  senna (SENOKOT) 8.6 MG TABS tablet Take 1 tablet (8.6 mg total) by mouth daily. 09/08/13 11/02/14  Jon GillsJoseph H Clark, MD  traZODone (DESYREL) 50 MG tablet Take 2 tabs by mouth at bedtime 08/31/14   Elveria Risingina Goodpasture, NP  trimethoprim (TRIMPEX) 100 MG tablet Take 100 mg by mouth every morning.     Historical Provider, MD  vitamin C (ASCORBIC ACID) 500 MG tablet Take 500 mg by mouth daily.    Historical Provider, MD    Allergies  Allergen Reactions  . Abilify [Aripiprazole] Swelling and Palpitations  . Seroquel [Quetiapine Fumarate] Palpitations  . Amoxicillin-Pot Clavulanate Hives  . Keppra [Levetiracetam] Other (See Comments)    Aggressive behavior     Physical Exam  Vitals  Blood pressure 114/73, pulse 91, temperature 99.3 F (37.4 C),  temperature source Oral, resp. rate 20, SpO2 98 %.   General:  In no acute distress, appears healthy and well nourished  Psych:  Normal affect and appropriate for developmental age  Neuro:   No focal neurological deficits, CN II through XII intact, Strength 5/5 all 4 extremities, Sensation intact all 4 extremities.  ENT:  Ears and Eyes appear Normal, Conjunctivae clear, PER. Moist oral mucosa without erythema or exudates.  Neck:  Supple, No lymphadenopathy appreciated  Respiratory:  Symmetrical chest wall movement, Good air movement bilaterally although somewhat decreased on the right, right basilar crackles Room Air  Cardiac:  RRR, No Murmurs, no LE edema noted, no JVD, No carotid bruits, peripheral pulses palpable at 2+  Abdomen:  Positive bowel sounds, Soft, Non tender, Non distended,  No masses appreciated, no obvious hepatosplenomegaly  Skin:  No Cyanosis, Normal Skin Turgor, No Skin Rash or Bruise.  Extremities: Symmetrical without obvious trauma or injury,  no effusions.  Data Review  CBC No results for input(s): WBC, HGB, HCT, PLT, MCV, MCH, MCHC, RDW, LYMPHSABS, MONOABS, EOSABS, BASOSABS, BANDABS in the last 168 hours.  Invalid input(s): NEUTRABS, BANDSABD  Chemistries  No results for input(s): NA, K, CL, CO2, GLUCOSE, BUN, CREATININE, CALCIUM, MG, AST, ALT, ALKPHOS, BILITOT in the last 168 hours.  Invalid input(s): GFRCGP  CrCl cannot be calculated (Patient has no serum creatinine result on file.).  No results for input(s): TSH, T4TOTAL, T3FREE, THYROIDAB in the last 72 hours.  Invalid input(s): FREET3  Coagulation profile No results for input(s): INR, PROTIME in the last 168 hours.  No results for input(s): DDIMER in the last 72 hours.  Cardiac Enzymes No results for input(s): CKMB, TROPONINI, MYOGLOBIN in the last 168 hours.  Invalid input(s): CK  Invalid input(s): POCBNP  Urinalysis    Component Value Date/Time   COLORURINE AMBER* 11/08/2014 0006     APPEARANCEUR CLEAR 11/08/2014 0006   LABSPEC 1.028 11/08/2014 0006   PHURINE 6.5 11/08/2014 0006   GLUCOSEU NEGATIVE 11/08/2014 0006  HGBUR NEGATIVE 11/08/2014 0006   BILIRUBINUR NEGATIVE 11/08/2014 0006   BILIRUBINUR Negative 09/14/2013 1103   KETONESUR 15* 11/08/2014 0006   PROTEINUR NEGATIVE 11/08/2014 0006   PROTEINUR Trace 09/14/2013 1103   UROBILINOGEN 1.0 11/08/2014 0006   UROBILINOGEN negative 09/14/2013 1103   NITRITE NEGATIVE 11/08/2014 0006   NITRITE Negative 09/14/2013 1103   LEUKOCYTESUR NEGATIVE 11/08/2014 0006    Imaging results:   Dg Chest 2 View  02/13/2015  CLINICAL DATA:  Cough for 3 weeks, worse today. EXAM: CHEST  2 VIEW COMPARISON:  11/07/2014. FINDINGS: Trachea is midline. Heart size normal. There is mild accentuation of the basilar pulmonary markings, which appears unchanged over several prior exams. However, there may be new airspace opacification in the right lower lobe. No airspace consolidation or pleural fluid. IMPRESSION: Suspect new airspace opacification in the right lower lobe, worrisome for pneumonia. Electronically Signed   By: Leanna Battles M.D.   On: 02/13/2015 07:23      Assessment & Plan  Principal Problem:  Acute respiratory failure:   A) CAP    B) Asthma -Medical floor observation status -Not hypoxemic at rest will check ambulatory saturation since parents report increased symptoms when up moving about -Chest x-ray with right lower lobe pneumonic changes which may have an aspiration component therefore we'll asked speech therapy to evaluate for aspiration given developmental delay and history of seizures -Empiric Rocephin and Zithromax IV -Recent issues with wheezing so continue preadmission nebulizers and low-dose IV SoluMedrol 60 mg every 12 hours;  -Apparently the wheezing has a chronic component so ?? VCD/reflux mediated -Mother mentioned issues with difficulty obtaining a repeat prescriptions for preadmission nebulizers  secondary to Medicare part D so we'll ask case management to assist -Flutter valve and other supportive care is indicated (guaifenesin for mucolytic properties) -Chek baseline CMET and CBC -Blood cultures, HIV and urinary strep and Legionella per pneumonia protocol -Influenza PCR; mother states A she never received influenza vaccine -Continue Singulair  Active Problems:   Nausea & vomiting -Continue IV fluid -Clear liquids initially -Zofran when necessary -May have a post tussive component    Seizure disorder (HCC) -Continue preadmission medication -Mother does not report recent issues with breakthrough seizures    Chronic constipation -Continue preadmission laxatives    Interstitial cystitis -Continue preadmission antibiotic/antifungal prophylaxis    Obesity (BMI 30-39.9)    Mental retardation, 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)/Autism spectrum disorder -Mother will remain with patient during hospitalization    DVT Prophylaxis: Lovenox-if patient refuses shot would be appropriate to utilize SCDs  Family Communication: Mother and father at bedside    Code Status:  Full code  Condition:  Stable  Discharge disposition: Anticipate discharge back to home with parents within the next 24-48 hours  Time spent in minutes : 60      Nusayba Cadenas L. ANP on 02/14/2015 at 4:42 PM  Between 7am to 7pm - Pager - [phone]  After 7pm go to [email or web address] - password [email or web address]  And look for the night coverage person covering me after hours  [facility]

## 2015-02-14 NOTE — ED Provider Notes (Signed)
CSN: [id number]     Arrival date & time 02/14/15  1038 History   First MD Initiated Contact with Patient 02/14/15 1357     Chief Complaint  Patient presents with  . Cough    HPI   Lori Miles is an 19 y.o. female with history of CP/MR, asthma, seizures, bronchitis who presents to the ED for evaluation of cough. She was seen in the ED yesterday and found to have RLL pneumonia. She was started on doxycycline, prednisone, and nebulizer as an outpatient. Pt's mom reports she is back today because pt does not seem to be improving. She states that pt continues to cough and has some coughing fits where she gets wheezy and short of breath. Pt's mom reports she has a pulmicort nebulizer at home but it is 0.5mg  and PCP's office recommended higher dose of  neb. However, pt's mom reports that they are unable to get the rx from PCP and so they are here for re-eval and Pulmicort  nebulizer rx. Pt's mom states she started taking the first dose of PO doxy last night and took the first dose of prednisone this AM. States pt continues to cough and has had one episode of NBNB emesis today.   In the ED pt was given one duoneb tx prior to exam. On my exam she has poor inspiratory effort but good lung sounds. No wheezes or rhonchi are appreciable. She is breathing comfortably. No tachypnea. She is tachycardic to low 100s. She is afebrile. Pt appears comfortable and is smiling.   Past Medical History  Diagnosis Date  . CP (cerebral palsy) (HCC)     mom denies  . Asthma   . Mental deficiency     hx of aggressive behavior,impaired speech   . Constipation   . Allergy   . Autism     mom states no actual dx, but admits she has some symptoms  . Frequency-urgency syndrome      some incontinence ; wears pull-ups  . Seizure (HCC)     most recent sz " at the end of summer"  . Interstitial cystitis   . Bronchitis    Past Surgical History  Procedure Laterality Date  . Cystoscopy    . Cystoscopy with  hydrodistension and biopsy  04/14/2012    Procedure: CYSTOSCOPY/BIOPSY/HYDRODISTENSION;  Surgeon: Lindaann Slough, MD;  Location: [facility];  Service: Urology;;  instillation of marcaine and pyridium   Family History  Problem Relation Age of Onset  . Seizures Mother   . Seizures Sister     1 sister has   Social History  Substance Use Topics  . Smoking status: Never Smoker   . Smokeless tobacco: Never Used  . Alcohol Use: No   OB History    No data available     Review of Systems  Unable to perform ROS: Psychiatric disorder      Allergies  Abilify; Seroquel; Amoxicillin-pot clavulanate; and Keppra  Home Medications   Prior to Admission medications   Medication Sig Start Date End Date Taking? Authorizing Provider  albuterol (PROVENTIL) (2.5 MG/3ML) 0.083% nebulizer solution Take 2.5 mg by nebulization every 6 (six) hours as needed for wheezing or shortness of breath (wheezing).     Historical Provider, MD  budesonide (PULMICORT) 0.5 MG/2ML nebulizer solution Take 2 mLs (0.5 mg total) by nebulization 2 (two) times daily. Per Dr. Willa Rough, Allergist 01/17/15   Roselyn Kara Mead, MD  cetirizine (ZYRTEC) 10 MG tablet Take 10 mg by mouth  daily.  08/07/11   Historical Provider, MD  cloNIDine (CATAPRES) 0.1 MG tablet Take 1 tab by mouth at bedtime. 11/16/14   Deetta Perla, MD  Cranberry 500 MG CAPS Take 500 mg by mouth daily.    Historical Provider, MD  DEPAKOTE 500 MG DR tablet Take 1 tablet twice per day 08/31/14   Elveria Rising, NP  doxycycline (VIBRAMYCIN) 100 MG capsule Take 1 capsule (100 mg total) by mouth 2 (two) times daily. One po bid x 7 days 02/13/15   Mercedes Camprubi-Soms, PA-C  fluconazole (DIFLUCAN) 100 MG tablet take 1 tablet by mouth once daily REPEAT IN 48-72 HOURS 12/21/14   Rachelle A Denney, CNM  hyoscyamine (LEVSIN SL) 0.125 MG SL tablet DISSOLVE 1 TABLET UNDER THE TONGUE 2 TIMES A DAY 08/08/14   Historical Provider, MD  Levonorgestrel-Ethinyl  Estradiol (SEASONIQUE) 0.15-0.03 &0.01 MG tablet Take 1 tablet by mouth daily. Take 1 active tablet daily, skip non active pills. 10/27/13   Antionette Char, MD  Melatonin 3 MG TABS Take 3 mg by mouth at bedtime. For insomnia    Historical Provider, MD  montelukast (SINGULAIR) 10 MG tablet Take 1 tablet (10 mg total) by mouth at bedtime. 12/20/14   Roselyn Kara Mead, MD  montelukast (SINGULAIR) 5 MG chewable tablet Chew 5 mg by mouth daily.    Historical Provider, MD  Multiple Vitamin (MULTIVITAMIN WITH MINERALS) TABS tablet Take 1 tablet by mouth daily.    Historical Provider, MD  nitrofurantoin, macrocrystal-monohydrate, (MACROBID) 100 MG capsule Take 1 capsule (100 mg total) by mouth 2 (two) times daily. 11/21/14   Hayden Rasmussen, NP  Olopatadine HCl 0.2 % SOLN Place 1 drop into both eyes daily.    Historical Provider, MD  ondansetron (ZOFRAN ODT) 8 MG disintegrating tablet Take 1 tablet (8 mg total) by mouth every 8 (eight) hours as needed for nausea or vomiting. Patient not taking: Reported on 12/16/2014 07/18/14   Tatyana Kirichenko, PA-C  ondansetron (ZOFRAN) 4 MG tablet Take 1 tablet (4 mg total) by mouth every 6 (six) hours. Patient taking differently: Take 4 mg by mouth every 8 (eight) hours as needed.  06/01/14   Tiffany Neva Seat, PA-C  phenazopyridine (PYRIDIUM) 200 MG tablet Take 1 tablet (200 mg total) by mouth 3 (three) times daily. Patient not taking: Reported on 12/16/2014 07/18/14   Lemont Fillers Kirichenko, PA-C  predniSONE (DELTASONE) 10 MG tablet Take 5 tablets (50 mg total) by mouth daily. Patient not taking: Reported on 12/16/2014 10/20/14   [name], MD  predniSONE (DELTASONE) 20 MG tablet 3 tabs po daily x 3 days 02/13/15   Mercedes Camprubi-Soms, PA-C  senna (SENOKOT) 8.6 MG TABS tablet Take 1 tablet (8.6 mg total) by mouth daily. 09/08/13 11/02/14  Jon Gills, MD  traZODone (DESYREL) 50 MG tablet Take 2 tabs by mouth at bedtime 08/31/14   Elveria Rising, NP  trimethoprim (TRIMPEX) 100 MG  tablet Take 100 mg by mouth every morning.     Historical Provider, MD  vitamin C (ASCORBIC ACID) 500 MG tablet Take 500 mg by mouth daily.    Historical Provider, MD   BP 113/67 mmHg  Pulse 103  Temp(Src) 99.3 F (37.4 C) (Oral)  Resp 20  SpO2 100% Physical Exam  Constitutional: No distress.  HENT:  Right Ear: External ear normal.  Left Ear: External ear normal.  Nose: Nose normal.  Mouth/Throat: Oropharynx is clear and moist. No oropharyngeal exudate.  Eyes: Conjunctivae and EOM are normal. Pupils are equal, round, and  reactive to light.  Neck: Normal range of motion. Neck supple.  Cardiovascular: Regular rhythm and normal heart sounds.  Tachycardia present.   No murmur heard. Pulmonary/Chest: Effort normal and breath sounds normal. No stridor. No respiratory distress. She has no wheezes. She has no rales.  Abdominal: Soft. Bowel sounds are normal. She exhibits no distension. There is no tenderness.  Musculoskeletal: Normal range of motion. She exhibits no edema or tenderness.  Lymphadenopathy:    She has no cervical adenopathy.  Neurological: She is alert. No cranial nerve deficit. Coordination normal.  Skin: Skin is warm and dry. No rash noted. She is not diaphoretic.  Nursing note and vitals reviewed.   ED Course  Procedures (including critical care time) Labs Review Labs Reviewed - No data to display  Imaging Review Dg Chest 2 View  02/13/2015  CLINICAL DATA:  Cough for 3 weeks, worse today. EXAM: CHEST  2 VIEW COMPARISON:  11/07/2014. FINDINGS: Trachea is midline. Heart size normal. There is mild accentuation of the basilar pulmonary markings, which appears unchanged over several prior exams. However, there may be new airspace opacification in the right lower lobe. No airspace consolidation or pleural fluid. IMPRESSION: Suspect new airspace opacification in the right lower lobe, worrisome for pneumonia. Electronically Signed   By: Leanna BattlesMelinda  Blietz M.D.   On: 02/13/2015  07:23   I have personally reviewed and evaluated these images and lab results as part of my medical decision-making.   EKG Interpretation None      MDM   Final diagnoses:  Right lower lobe pneumonia    Pt was reportedly SOB in triage. I saw her after one duoneb tx. Pt appears comfortable. Lungs sound clear though not great inspiratory effort. Pt mildly tachycardic which is improving with fluids. Pt's mom reports she is not able to keep food down but pt was able to tolerate PO in the ED with no problems. She is afebrile, maintaining SpO2 99-100% on RA. She has only just started abx and steroid therapy last night. However, mother spoke with both me and attending MD [facility] and is essentially demanding admission as she feels she cannot care for pt and states she is afraid pt will acutely decompensate at home. At this time I do not agree that pt warrants inpatient admission. However, pt's mom is insistent that she is unable to tolerate PO and is significantly short of breath at home. Attending spoke with pt's mom and stated that we can talk to hospitalist service but cannot guarantee admission and cannot guarantee insurance coverage.  Hospitalist will admit to obs.    Carlene CoriaSerena Y Jenilyn Magana, PA-C 02/15/15 [id number]  Courteney Randall AnLyn [facility], MD 02/16/15 [phone]

## 2015-02-14 NOTE — Progress Notes (Signed)
Lab staff came to patient's room to draw blood. Patient's mom refused, stated she already had lab drawn just awhile ago, also stated "My daughter don't have HIV, she doesn't have to be tested." K. Schorr NP paged and notified.

## 2015-02-15 ENCOUNTER — Telehealth: Payer: Self-pay | Admitting: Allergy and Immunology

## 2015-02-15 ENCOUNTER — Encounter (HOSPITAL_COMMUNITY): Payer: Self-pay

## 2015-02-15 DIAGNOSIS — E669 Obesity, unspecified: Secondary | ICD-10-CM

## 2015-02-15 DIAGNOSIS — J189 Pneumonia, unspecified organism: Secondary | ICD-10-CM | POA: Diagnosis not present

## 2015-02-15 DIAGNOSIS — F840 Autistic disorder: Secondary | ICD-10-CM

## 2015-02-15 MED ORDER — FLUCONAZOLE 100 MG PO TABS
ORAL_TABLET | ORAL | Status: DC
Start: 2015-02-15 — End: 2015-07-01

## 2015-02-15 MED ORDER — GUAIFENESIN 100 MG/5ML PO SOLN: 5.0000 mL | ORAL | Status: DC | PRN

## 2015-02-15 MED ORDER — ONDANSETRON 8 MG PO TBDP: 8.0000 mg | ORAL_TABLET | Freq: Three times a day (TID) | ORAL | Status: DC | PRN

## 2015-02-15 MED ORDER — PREDNISONE 10 MG PO TABS: ORAL_TABLET | ORAL | Status: DC

## 2015-02-15 MED ORDER — BUDESONIDE 1 MG/2ML IN SUSP: 1.0000 mg | Freq: Three times a day (TID) | RESPIRATORY_TRACT | Status: DC | PRN

## 2015-02-15 NOTE — Progress Notes (Signed)
Chameka Buchbinder to be D/C'd to home per MD order.  Discussed with the patient and mother and all questions fully answered.  VSS, Skin clean, dry and intact without evidence of skin break down, no evidence of skin tears noted. IV catheter discontinued intact. Site without signs and symptoms of complications. Dressing and pressure applied.  An After Visit Summary was printed and given to the patient. Patient received prescriptions.  D/c education completed with patient/family including follow up instructions, medication list, d/c activities limitations if indicated, with other d/c instructions as indicated by MD - patient mother able to verbalize understanding, all questions fully answered.   Patient instructed to return to ED, call 911, or call MD for any changes in condition.   Patient escorted via WC, and D/C home via private auto.  Joellyn HaffKayla L Price 02/15/2015 12:40 PM

## 2015-02-15 NOTE — Evaluation (Signed)
Clinical/Bedside Swallow Evaluation Patient Details  Name: Lori Miles MRN: [id number] Date of Birth: 13-Aug-1995  Today's Date: 02/15/2015 Time: SLP Start Time (ACUTE ONLY): 7829 SLP Stop Time (ACUTE ONLY): 0852 SLP Time Calculation (min) (ACUTE ONLY): 24 min  Past Medical History:  Past Medical History  Diagnosis Date  . CP (cerebral palsy) (HCC)     mom denies  . Asthma   . Mental deficiency     hx of aggressive behavior,impaired speech   . Constipation   . Allergy   . Autism     mom states no actual dx, but admits she has some symptoms  . Frequency-urgency syndrome      some incontinence ; wears pull-ups  . Seizure (HCC)     most recent sz " at the end of summer"  . Interstitial cystitis   . Bronchitis    Past Surgical History:  Past Surgical History  Procedure Laterality Date  . Cystoscopy    . Cystoscopy with hydrodistension and biopsy  04/14/2012    Procedure: CYSTOSCOPY/BIOPSY/HYDRODISTENSION;  Surgeon: Lori Slough, MD;  Location: [facility];  Service: Urology;;  instillation of marcaine and pyridium   HPI:  19 year old female patient with history of developmental delay, autism like behaviors, chronic interstitial cystitis, chronic constipation, seizure disorder, and asthma, chronic wheezing admitted for increased cough x 3 weeks, nausea and vomiting specially after coughing episode. Chest x-ray completed on 11/28 revealed new airspace opacification in the right lower lobe worrisome for pneumonia. Per MD notes, chest x-ray concerning for aspiration component especially with history of chronic wheezing and recurrent cough and what sounds like symptoms consistent with vocal cord dysfunction.   Assessment / Plan / Recommendation Clinical Impression  Pt alert, mom at bedside who reports sometimes coughs with liquid when her asthma "acts up." Lori Miles sitting up in bed upon SLP arrival; respiratory rate/pattern normal, no wet coughs or audible  respirations. She required tactile and verbal cues to decrease bite size with solids. Pt does not appear to be aspirating; behavioral aspects increase risks (eating fast, large bites etc) and pt's reflux. Discussed this with mom and provided education to increase safety during meals. Upgrade to regular texture, thin liquids, straws allowed, pills whole in applesauce. No further ST needed.    Aspiration Risk  Moderate aspiration risk    Diet Recommendation   Regular/thin  Medication Administration: Whole meds with puree    Other  Recommendations Oral Care Recommendations: Oral care BID   Follow up Recommendations  None    Frequency and Duration            Prognosis        Swallow Study   General HPI: 19 year old female patient with history of developmental delay, autism like behaviors, chronic interstitial cystitis, chronic constipation, seizure disorder, and asthma, chronic wheezing admitted for increased cough x 3 weeks, nausea and vomiting specially after coughing episode. Chest x-ray completed on 11/28 revealed new airspace opacification in the right lower lobe worrisome for pneumonia. Per MD notes, chest x-ray concerning for aspiration component especially with history of chronic wheezing and recurrent cough and what sounds like symptoms consistent with vocal cord dysfunction. Type of Study: Bedside Swallow Evaluation Previous Swallow Assessment:  (none found) Diet Prior to this Study: Thin liquids (clears) Temperature Spikes Noted: No Respiratory Status: Room air History of Recent Intubation: No Behavior/Cognition: Alert;Cooperative;Pleasant mood;Requires cueing Oral Cavity Assessment: Within Functional Limits Oral Care Completed by SLP: No Oral Cavity - Dentition: Adequate natural dentition Vision: Functional  for self-feeding Self-Feeding Abilities: Able to feed self Patient Positioning: Upright in bed Baseline Vocal Quality: Normal Volitional Cough: Weak Volitional  Swallow: Able to elicit    Oral/Motor/Sensory Function Overall Oral Motor/Sensory Function: Within functional limits   Ice Chips Ice chips: Not tested   Thin Liquid Thin Liquid: Within functional limits Presentation: Cup;Straw    Nectar Thick Nectar Thick Liquid: Not tested   Honey Thick Honey Thick Liquid: Not tested   Puree Puree: Within functional limits   Solid Solid: Within functional limits (impulsive)       Lori CashLitaker, Lori CoonsLisa Miles 02/15/2015,9:12 AM  Lori CoonsLisa Miles Lori FaceLitaker M.Ed [employer] Pager [phone]

## 2015-02-15 NOTE — Telephone Encounter (Signed)
Tried to call Mom.  No answer.  Left voicemail for Mom to call back on Wednesday morning since our office is closed at this time.

## 2015-02-15 NOTE — Care Management Note (Signed)
Case Management Note  Patient Details  Name: Daneen SchickJasmine Ocallaghan MRN: [id number] Date of Birth: 03-05-1996  Subjective/Objective:     Date: 02/15/15 Spoke with patient at the bedside along with mother.  Introduced self as Sports coachcase manager and explained role in discharge planning and how to be reached.  Verified patient lives in town, with mom.  Expressed potential need for no other DME.  Verified patient anticipates to go home with family, at time of discharge and will have full-time supervision by family at this time to best of their knowledge. Mom denied needing help with their medication. Mom stated that she has medicare and medicaid and that she only needs to get the pulmonologist to sign a form and she will get what other meds she needs for her asthma and this is not a problem for her.  NCM gave Mom a list of pulmonologist doctors to follow up with. Patient is driven by mom to MD appointments.  Verified patient has PCP [facility].   Plan: CM will continue to follow for discharge planning and [facility] resources.                Action/Plan:   Expected Discharge Date:                  Expected Discharge Plan:  Home/Self Care  In-House Referral:     Discharge planning Services  CM Consult  Post Acute Care Choice:    Choice offered to:     DME Arranged:    DME Agency:     HH Arranged:    HH Agency:     Status of Service:  Completed, signed off  Medicare Important Message Given:    Date Medicare IM Given:    Medicare IM give by:    Date Additional Medicare IM Given:    Additional Medicare Important Message give by:     If discussed at Long Length of Stay Meetings, dates discussed:    Additional Comments:  Leone Havenaylor, Reana Chacko Clinton, RN 02/15/2015, 2:50 PM

## 2015-02-15 NOTE — Discharge Summary (Signed)
Physician Discharge Summary  Lori Miles ZOX:[id number] DOB: December 16, 1995 DOA: 02/14/2015  PCP: Dorrene German, MD  Admit date: 02/14/2015 Discharge date: 02/15/2015  Time spent: 35 minutes  Recommendations for Outpatient Follow-up:  Note given for school   Discharge Diagnoses:  Principal Problem:   CAP (community acquired pneumonia) Active Problems:   Seizure disorder (HCC)   Asthma   Chronic constipation   Interstitial cystitis   Obesity (BMI 30-39.9)   Mental retardation, 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)   Autism spectrum disorder   Acute respiratory failure (HCC)   Nausea & vomiting   Discharge Condition: improved  Diet recommendation: regular  Filed Weights   02/14/15 1901  Weight: 78.699 kg (173 lb 8 oz)    History of present illness:  This is a 19 year old female patient without Autism spectrum disorder/developmental delay, chronic interstitial cystitis, chronic constipation, seizure disorder, and asthma. Patient's mother's the primary historian and reports for about 3 weeks patient has had increased cough. Patient does have chronic wheezing and occasional coughing but she has been progressively worsening and now has green productive sputum. She was evaluated as an outpatient by primary care physician and started on antibiotics and steroids and nebs without any improvement in her symptoms. The patient has been experiencing nausea and vomiting specially after coughing episode. Because of persistent symptomatology and patient inability to keep medications down her mother brought her to the ER for further evaluation.  In the ER the patient had low-grade fever 99.3, BP was stable at 114/73 pulse was 98 and regular respirations 20. Room air saturations were 98%. Chest x-ray completed on 11/28 revealed new airspace opacification in the right lower lobe worrisome for pneumonia. In the ER today the patient was given duo neb along with a liter of fluid and Zofran with improvement in her  wheezing but patient continued to have issues with nausea after eating although she has not yet vomited since attempting solid food earlier today. Chest x-ray concerning for aspiration component especially with history of chronic wheezing and recurrent cough and what sounds like symptoms consistent with vocal cord dysfunction.  Hospital Course:  [facility]NA -steroid taper -finish course of abx -much improved -no aspiration issue  Procedures:    Consultations:    Discharge Exam: Filed Vitals:   02/14/15 2121 02/15/15 0535  BP: 113/65 102/66  Pulse: 92 87  Temp: 98.6 F (37 C) 98.2 F (36.8 C)  Resp: 20 16    General: much improved, mom anxious to go home Cardiovascular: rrr Respiratory: clear  Discharge Instructions   Discharge Instructions    Diet general    Complete by:  As directed      Increase activity slowly    Complete by:  As directed           Discharge Medication List as of 02/15/2015 12:20 PM    START taking these medications   Details  guaiFENesin (ROBITUSSIN) 100 MG/5ML SOLN Take 5 mLs (100 mg total) by mouth every 4 (four) hours as needed for cough or to loosen phlegm., Starting 02/15/2015, Until Discontinued, Print    budesonide (PULMICORT) 1 MG/2ML nebulizer solution Take 2 mLs (1 mg total) by nebulization every 8 (eight) hours as needed., Starting 02/14/2015, Until Discontinued, Print    !! ondansetron (ZOFRAN ODT) 4 MG disintegrating tablet Take 1 tablet (4 mg total) by mouth every 8 (eight) hours as needed for nausea or vomiting., Starting 02/14/2015, Until Discontinued, Print     !! - Potential duplicate medications found. Please discuss with  provider.    CONTINUE these medications which have CHANGED   Details  fluconazole (DIFLUCAN) 100 MG tablet take 1 tablet by mouth once daily as needed for yeast infection, No Print    predniSONE (DELTASONE) 10 MG tablet 40 mg x 2 days, 30 mg x 2 days, 20 mg x 2 days, 10 mg x 2 days then d/c, Print       CONTINUE these medications which have NOT CHANGED   Details  albuterol (PROVENTIL) (2.5 MG/3ML) 0.083% nebulizer solution Take 2.5 mg by nebulization every 6 (six) hours as needed for wheezing or shortness of breath (wheezing). , Until Discontinued, Historical Med    cetirizine (ZYRTEC) 10 MG tablet Take 10 mg by mouth at bedtime. , Starting 08/07/2011, Until Discontinued, Historical Med    cloNIDine (CATAPRES) 0.1 MG tablet Take 1 tab by mouth at bedtime., Normal    Cranberry 500 MG CAPS Take 500 mg by mouth daily., Until Discontinued, Historical Med    DEPAKOTE 500 MG DR tablet Take 1 tablet twice per day, Print    doxycycline (VIBRAMYCIN) 100 MG capsule Take 1 capsule (100 mg total) by mouth 2 (two) times daily. One po bid x 7 days, Starting 02/13/2015, Until Discontinued, Print    hyoscyamine (LEVSIN SL) 0.125 MG SL tablet DISSOLVE 1 TABLET UNDER THE TONGUE 2 TIMES A DAY, Historical Med    Levonorgestrel-Ethinyl Estradiol (SEASONIQUE) 0.15-0.03 &0.01 MG tablet Take 1 tablet by mouth daily. Take 1 active tablet daily, skip non active pills., Starting 10/27/2013, Until Discontinued, Normal    Melatonin 3 MG TABS Take 3 mg by mouth at bedtime. For insomnia, Until Discontinued, Historical Med    montelukast (SINGULAIR) 10 MG tablet Take 1 tablet (10 mg total) by mouth at bedtime., Starting 12/20/2014, Until Discontinued, Normal    Multiple Vitamin (MULTIVITAMIN WITH MINERALS) TABS tablet Take 1 tablet by mouth daily., Until Discontinued, Historical Med    nitrofurantoin, macrocrystal-monohydrate, (MACROBID) 100 MG capsule Take 1 capsule (100 mg total) by mouth 2 (two) times daily., Starting 11/21/2014, Until Discontinued, Print    nystatin-triamcinolone (MYCOLOG II) cream Apply 1 application topically daily., Starting 02/10/2015, Until Discontinued, Historical Med    Olopatadine HCl 0.2 % SOLN Place 1 drop into both eyes daily as needed (allergies). , Until Discontinued, Historical Med     traZODone (DESYREL) 50 MG tablet Take 2 tabs by mouth at bedtime, Normal    trimethoprim (TRIMPEX) 100 MG tablet Take 100 mg by mouth every morning. , Until Discontinued, Historical Med    vitamin C (ASCORBIC ACID) 500 MG tablet Take 500 mg by mouth daily., Until Discontinued, Historical Med    budesonide (PULMICORT) 0.5 MG/2ML nebulizer solution Take 2 mLs (0.5 mg total) by nebulization 2 (two) times daily. Per Dr. Willa Rough, Allergist, Starting 01/17/2015, Until Discontinued, Phone In    senna (SENOKOT) 8.6 MG TABS tablet Take 1 tablet (8.6 mg total) by mouth daily., Starting 09/08/2013, Until Wed 11/02/14, OTC    !! ondansetron (ZOFRAN ODT) 8 MG disintegrating tablet Take 1 tablet (8 mg total) by mouth every 8 (eight) hours as needed for nausea or vomiting., Starting 07/18/2014, Until Discontinued, Print     !! - Potential duplicate medications found. Please discuss with provider.    STOP taking these medications     ondansetron (ZOFRAN) 4 MG tablet      phenazopyridine (PYRIDIUM) 200 MG tablet        Allergies  Allergen Reactions  . Abilify [Aripiprazole] Swelling and Palpitations  .  Seroquel [Quetiapine Fumarate] Palpitations  . Amoxicillin-Pot Clavulanate Hives  . Keppra [Levetiracetam] Other (See Comments)    Aggressive behavior    Follow-up Information    Follow up with AVBUERE,EDWIN A, MD In 1 week.   Specialty:  Internal Medicine   Contact information:   [address] [place] [address] [phone]        The results of significant diagnostics from this hospitalization (including imaging, microbiology, ancillary and laboratory) are listed below for reference.    Significant Diagnostic Studies: Dg Chest 2 View  02/13/2015  CLINICAL DATA:  Cough for 3 weeks, worse today. EXAM: CHEST  2 VIEW COMPARISON:  11/07/2014. FINDINGS: Trachea is midline. Heart size normal. There is mild accentuation of the basilar pulmonary markings, which appears unchanged over several  prior exams. However, there may be new airspace opacification in the right lower lobe. No airspace consolidation or pleural fluid. IMPRESSION: Suspect new airspace opacification in the right lower lobe, worrisome for pneumonia. Electronically Signed   By: Leanna BattlesMelinda  Blietz M.D.   On: 02/13/2015 07:23    Microbiology: Recent Results (from the past 240 hour(s))  Culture, blood (routine x 2)     Status: None (Preliminary result)   Collection Time: 02/14/15  5:44 PM  Result Value Ref Range Status   Specimen Description BLOOD RIGHT HAND  Final   Special Requests BOTTLES DRAWN AEROBIC ONLY 5CC  Final   Culture NO GROWTH < 24 HOURS  Final   Report Status PENDING  Incomplete  Culture, blood (routine x 2)     Status: None (Preliminary result)   Collection Time: 02/14/15  5:46 PM  Result Value Ref Range Status   Specimen Description BLOOD RIGHT WRIST  Final   Special Requests BOTTLES DRAWN AEROBIC ONLY 5CC  Final   Culture NO GROWTH < 24 HOURS  Final   Report Status PENDING  Incomplete     Labs: Basic Metabolic Panel:  Recent Labs Lab 02/14/15 1450  NA 139  K 4.4  CL 106  CO2 23  GLUCOSE 128*  BUN 9  CREATININE 0.86  CALCIUM 9.2   Liver Function Tests:  Recent Labs Lab 02/14/15 1450  AST 37  ALT 42  ALKPHOS 41  BILITOT 0.2*  PROT 6.5  ALBUMIN 3.0*   No results for input(s): LIPASE, AMYLASE in the last 168 hours. No results for input(s): AMMONIA in the last 168 hours. CBC:  Recent Labs Lab 02/14/15 1450  WBC 10.4  NEUTROABS 8.3*  HGB 11.9*  HCT 36.8  MCV 91.3  PLT 290   Cardiac Enzymes: No results for input(s): CKTOTAL, CKMB, CKMBINDEX, TROPONINI in the last 168 hours. BNP: BNP (last 3 results)  Recent Labs  10/24/14 0745  BNP 16.1    ProBNP (last 3 results) No results for input(s): PROBNP in the last 8760 hours.  CBG: No results for input(s): GLUCAP in the last 168 hours.     Signed:  JESSICA Benjamine MolaVANN  [facility] 02/15/2015, 2:10  PM

## 2015-02-15 NOTE — Progress Notes (Signed)
Called pharmacist to see if available to verify patient's morning meds with RN. RN was asked by pharmacist to bring patient's home meds to pharmacy to be verified. Patient's mother refused to do so. Patient's mother gave home medications, including Depakote, Vitamin C, Levsin, Trimpex, and cranberry caps.

## 2015-02-15 NOTE — Telephone Encounter (Signed)
Mother called about pt. i was confused at what she was telling me. What i gathered was that Verdis was admitted to the ER, maybe? They gave her Pulmicort 1mg . We have given her .05 pulmicort in the past. Mom said pharm was sending a PA for this medication. She was at the pharm when she called and i let her know that it would be towards the end of the day before we could take care of this. Fax is still not here

## 2015-02-16 ENCOUNTER — Ambulatory Visit: Payer: Medicare Other | Admitting: Allergy and Immunology

## 2015-02-16 ENCOUNTER — Telehealth: Payer: Self-pay

## 2015-02-16 NOTE — Telephone Encounter (Signed)
Mom called stating had to take Lori Miles [facility].  Per Mom, CXR done and diagnosed with Pneumonia.  Mom states was given Doxycycline Hyclate 100mg  and Prednisone and was sent home.  Mom had to take her back on Tuesday 02/14/15 and was admitted, per Mom.  Mom states she was dehydrated and gasping for breath, red in face and trouble sleeping.  Mom states her Pulmicort strength was increase to 1mg  and was give Doxycycline Hyclate via IV.  Mom states Lori Miles was then sent home with Prednisone, Z-Pack, and Pulmicort 1mg .  She states that Lori Miles is still not feeling well and gasps for breath and gets red in the face.  We [facility] and [facility] to be [facility] out for the Pulmicort 1 mg.  I advised Mom that we need to see Lori Miles today.  I offered her a 10:30 AM and 1:30 PM.  Mom chose the 1:30 PM appointment because she stated had to go pay bills this morning.  Lori Miles is scheduled for the 1:30 PM and Mom is advised if Lori Miles gets worse to either go to the ED or call 911 if in distress.  Mom voiced understood.

## 2015-02-16 NOTE — Telephone Encounter (Signed)
Mom called back sometime before lunch and cancelled Lori Miles's appointment that was scheduled for today at 1:30 PM.  She rescheduled the appointment for 03/08/15 at 3:45 PM.  Please review telephone contact taken earlier today 02-16-15 which detailed the situation with [name]'s breathing.  Mom asked if we are still going to do the Prior authorization and DOW form sent [facility] for the Pulmicort 1 mg, which was given by [facility].  Please advise what to do about the prior authorization.

## 2015-02-17 NOTE — Telephone Encounter (Signed)
PA completed waiting on response  

## 2015-02-17 NOTE — Telephone Encounter (Signed)
Complete prior authorization for only one month supply.

## 2015-02-19 LAB — CULTURE, BLOOD (ROUTINE X 2)
CULTURE: NO GROWTH
Culture: NO GROWTH

## 2015-02-21 ENCOUNTER — Ambulatory Visit: Payer: Medicare Other | Admitting: Pediatrics

## 2015-02-22 NOTE — Telephone Encounter (Signed)
PA was denied. Please advise

## 2015-02-26 ENCOUNTER — Emergency Department (HOSPITAL_COMMUNITY)
Admission: EM | Admit: 2015-02-26 | Discharge: 2015-02-26 | Disposition: A | Discharge status: Home / Self Care | Payer: Medicare Other | Attending: Emergency Medicine | Admitting: Emergency Medicine

## 2015-02-26 ENCOUNTER — Emergency Department (HOSPITAL_COMMUNITY): Payer: Medicare Other

## 2015-02-26 ENCOUNTER — Encounter (HOSPITAL_COMMUNITY): Payer: Self-pay | Admitting: Emergency Medicine

## 2015-02-26 DIAGNOSIS — Z8669 Personal history of other diseases of the nervous system and sense organs: Secondary | ICD-10-CM | POA: Insufficient documentation

## 2015-02-26 DIAGNOSIS — Z79899 Other long term (current) drug therapy: Secondary | ICD-10-CM | POA: Insufficient documentation

## 2015-02-26 DIAGNOSIS — Z79818 Long term (current) use of other agents affecting estrogen receptors and estrogen levels: Secondary | ICD-10-CM | POA: Insufficient documentation

## 2015-02-26 DIAGNOSIS — Z7951 Long term (current) use of inhaled steroids: Secondary | ICD-10-CM | POA: Diagnosis not present

## 2015-02-26 DIAGNOSIS — R062 Wheezing: Secondary | ICD-10-CM | POA: Diagnosis present

## 2015-02-26 DIAGNOSIS — Z87448 Personal history of other diseases of urinary system: Secondary | ICD-10-CM | POA: Insufficient documentation

## 2015-02-26 DIAGNOSIS — Z880 Allergy status to penicillin: Secondary | ICD-10-CM | POA: Insufficient documentation

## 2015-02-26 DIAGNOSIS — F840 Autistic disorder: Secondary | ICD-10-CM | POA: Diagnosis not present

## 2015-02-26 DIAGNOSIS — J45901 Unspecified asthma with (acute) exacerbation: Secondary | ICD-10-CM | POA: Insufficient documentation

## 2015-02-26 DIAGNOSIS — Z792 Long term (current) use of antibiotics: Secondary | ICD-10-CM | POA: Insufficient documentation

## 2015-02-26 DIAGNOSIS — R059 Cough, unspecified: Secondary | ICD-10-CM

## 2015-02-26 DIAGNOSIS — R05 Cough: Secondary | ICD-10-CM

## 2015-02-26 MED ORDER — IPRATROPIUM-ALBUTEROL 0.5-2.5 (3) MG/3ML IN SOLN
3.0000 mL | Freq: Once | RESPIRATORY_TRACT | Status: AC
  Administered 2015-02-26: 3 mL via RESPIRATORY_TRACT
  Filled 2015-02-26: qty 3

## 2015-02-26 NOTE — ED Notes (Signed)
Pt with Hx of autism and asthma c/o wheezing. Pt was recently hospitalized and prescribed doxycycline, prednisone for pneumonia. Pulmicort and albuterol not helping. Family concerned that more medication is needed for patient.

## 2015-02-26 NOTE — Discharge Instructions (Signed)

## 2015-02-26 NOTE — ED Notes (Signed)
Awake. Verbally responsive. A/O x4. Resp even and unlabored. No audible adventitious breath sounds noted. ABC's intact.  

## 2015-02-26 NOTE — ED Provider Notes (Signed)
CSN: [id number]     Arrival date & time 02/26/15  1441 History   First MD Initiated Contact with Patient 02/26/15 1544     Chief Complaint  Patient presents with  . Wheezing   HPI   19 year old female presents today with wheezing. 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 Patient was diagnosed with pneumonia on 02/13/2015 was started on doxycycline, prednisone and nebulized albuterol. She returned to the ED the following day because mother reported symptoms were not improving and patient was short of breath. She insisted on hospital admission; patient was admitted overnight for obvious with no significant changes discharged home on antibiotics, steroids, and 1 mg Pulmicort. She reports that since then she has been unable to fill the 1 mg nebulized solution has primary care has not filled out the proper paperwork. She reports that symptoms have the cough has improved, but she has episodes where she has coughing followed by bilious, bloody nonprojectile vomiting. She reports audible wheezing, not improved with 0.5 mg Pulmicort. She reports coughing throughout the night, but denies any respiratory distress. She reports patient has been afebrile, eating and drinking appropriately with no complaints of chest pain, abdominal pain, or any other concerning signs or symptoms. Mother requesting reevaluation for persistent cough.  Past Medical History  Diagnosis Date  . CP (cerebral palsy) (HCC)     mom denies  . Asthma   . Mental deficiency     hx of aggressive behavior,impaired speech   . Constipation   . Allergy   . Autism     mom states no actual dx, but admits she has some symptoms  . Frequency-urgency syndrome      some incontinence ; wears pull-ups  . Seizure (HCC)     most recent sz " at the end of summer"  . Interstitial cystitis   . Bronchitis    Past Surgical History  Procedure Laterality Date  . Cystoscopy    . Cystoscopy with hydrodistension and biopsy   04/14/2012    Procedure: CYSTOSCOPY/BIOPSY/HYDRODISTENSION;  Surgeon: Lindaann SloughMarc-Henry Nesi, MD;  Location[facility];  Service: Urology;;  instillation of marcaine and pyridium   Family History  Problem Relation Age of Onset  . Seizures Mother   . Seizures Sister     1 sister has   Social History  Substance Use Topics  . Smoking status: Never Smoker   . Smokeless tobacco: Never Used  . Alcohol Use: No   OB History    No data available     Review of Systems  All other systems reviewed and are negative.   Allergies  Abilify; Seroquel; Amoxicillin-pot clavulanate; and Keppra  Home Medications   Prior to Admission medications   Medication Sig Start Date End Date Taking? Authorizing Provider  albuterol (PROVENTIL) (2.5 MG/3ML) 0.083% nebulizer solution Take 2.5 mg by nebulization every 6 (six) hours as needed for wheezing or shortness of breath (wheezing).     Historical Provider, MD  budesonide (PULMICORT) 1 MG/2ML nebulizer solution Take 2 mLs (1 mg total) by nebulization every 8 (eight) hours as needed. 02/15/15   Hoyt KochJessica Upchurch Vann, DO  cetirizine (ZYRTEC) 10 MG tablet Take 10 mg by mouth at bedtime.  08/07/11   Historical Provider, MD  cloNIDine (CATAPRES) 0.1 MG tablet Take 1 tab by mouth at bedtime. Patient taking differently: Take 0.15 mg by mouth at bedtime. Take 1 tab by mouth at bedtime. 11/16/14   Deetta PerlaWilliam H Hickling, MD  Cranberry  500 MG CAPS Take 500 mg by mouth daily.    Historical Provider, MD  DEPAKOTE 500 MG DR tablet Take 1 tablet twice per day Patient taking differently: Take 500 mg by mouth 2 (two) times daily. Take 1 tablet twice per day 08/31/14   Elveria Rising, NP  doxycycline (VIBRAMYCIN) 100 MG capsule Take 1 capsule (100 mg total) by mouth 2 (two) times daily. One po bid x 7 days 02/13/15   Mercedes Camprubi-Soms, PA-C  fluconazole (DIFLUCAN) 100 MG tablet take 1 tablet by mouth once daily as needed for yeast infection 02/15/15   Hoyt Koch, DO  guaiFENesin (ROBITUSSIN) 100 MG/5ML SOLN Take 5 mLs (100 mg total) by mouth every 4 (four) hours as needed for cough or to loosen phlegm. 02/15/15   Hoyt Koch, DO  hyoscyamine (LEVSIN SL) 0.125 MG SL tablet DISSOLVE 1 TABLET UNDER THE TONGUE 2 TIMES A DAY 08/08/14   Historical Provider, MD  Levonorgestrel-Ethinyl Estradiol (SEASONIQUE) 0.15-0.03 &0.01 MG tablet Take 1 tablet by mouth daily. Take 1 active tablet daily, skip non active pills. Patient taking differently: Take 1 tablet by mouth at bedtime. Take 1 active tablet daily, skip non active pills. 10/27/13   Antionette Char, MD  Melatonin 3 MG TABS Take 3 mg by mouth at bedtime. For insomnia    Historical Provider, MD  montelukast (SINGULAIR) 10 MG tablet Take 1 tablet (10 mg total) by mouth at bedtime. 12/20/14   Roselyn Kara Mead, MD  Multiple Vitamin (MULTIVITAMIN WITH MINERALS) TABS tablet Take 1 tablet by mouth daily.    Historical Provider, MD  nitrofurantoin, macrocrystal-monohydrate, (MACROBID) 100 MG capsule Take 1 capsule (100 mg total) by mouth 2 (two) times daily. Patient taking differently: Take 100 mg by mouth 2 (two) times daily as needed (alternates with Trimpex).  11/21/14   Hayden Rasmussen, NP  nystatin-triamcinolone (MYCOLOG II) cream Apply 1 application topically daily. 02/10/15   Historical Provider, MD  Olopatadine HCl 0.2 % SOLN Place 1 drop into both eyes daily as needed (allergies).     Historical Provider, MD  ondansetron (ZOFRAN ODT) 8 MG disintegrating tablet Take 1 tablet (8 mg total) by mouth every 8 (eight) hours as needed for nausea or vomiting. 02/15/15   Hoyt Koch, DO  predniSONE (DELTASONE) 10 MG tablet 40 mg x 2 days, 30 mg x 2 days, 20 mg x 2 days, 10 mg x 2 days then d/c 02/15/15   Hoyt Koch, DO  senna (SENOKOT) 8.6 MG TABS tablet Take 1 tablet (8.6 mg total) by mouth daily. 09/08/13 11/02/14  Jon Gills, MD  traZODone (DESYREL) 50 MG tablet Take 2 tabs by mouth  at bedtime Patient taking differently: Take 100 mg by mouth at bedtime. Take 2 tabs by mouth at bedtime 08/31/14   Elveria Rising, NP  trimethoprim (TRIMPEX) 100 MG tablet Take 100 mg by mouth every morning.     Historical Provider, MD  vitamin C (ASCORBIC ACID) 500 MG tablet Take 500 mg by mouth daily.    Historical Provider, MD   BP 117/87 mmHg  Pulse 116  Temp(Src) 98.1 F (36.7 C) (Oral)  Resp 15  SpO2 97%   Physical Exam  Constitutional: She is oriented to person, place, and time. She appears well-developed and well-nourished.  Well-appearing in no acute distress, nontoxic  HENT:  Head: Normocephalic and atraumatic.  Eyes: Conjunctivae are normal. Pupils are equal, round, and reactive to light. Right eye exhibits no discharge. Left eye exhibits  no discharge. No scleral icterus.  Neck: Normal range of motion. No JVD present. No tracheal deviation present.  Pulmonary/Chest: Effort normal and breath sounds normal. No stridor. No respiratory distress. She has no wheezes. She has no rales. She exhibits no tenderness.  Poor patient effort  Abdominal: She exhibits no distension and no mass. There is no tenderness. There is no rebound and no guarding.  Musculoskeletal: Normal range of motion. She exhibits no edema or tenderness.  Neurological: She is alert and oriented to person, place, and time. Coordination normal.  Skin: Skin is warm and dry. No rash noted. No erythema.  Psychiatric: She has a normal mood and affect. Her behavior is normal. Judgment and thought content normal.  Nursing note and vitals reviewed.     ED Course  Procedures (including critical care time) Labs Review Labs Reviewed - No data to display  Imaging Review Dg Chest 2 View  02/26/2015  CLINICAL DATA:  Cough EXAM: CHEST - 2 VIEW COMPARISON:  02/13/2015 FINDINGS: The heart size and mediastinal contours are within normal limits. Both lungs are clear. The visualized skeletal structures are unremarkable.  IMPRESSION: No acute abnormality noted. Electronically Signed   By: Alcide Clever M.D.   On: 02/26/2015 16:47   I have personally reviewed and evaluated these images and lab results as part of my medical decision-making.   EKG Interpretation None      MDM   Final diagnoses:  Cough   Labs:  Imaging: DG chest 2 (findings  Consults:  Therapeutics: DuoNeb  Discharge Meds:    Assessment/Plan: Patient presents with her mother reports episodic wheezing history pneumonia. Chest x-ray here today shows no abnormality, she is afebrile with reassuring vital signs, nontoxic appearing in no acute distress. She had no episodes of coughing or wheeze during my evaluation, physical exam shows no wheezing. She was given a DuoNeb here at [employer] request. Patient will be discharged home with instructions to follow-up with her pulmonologist as previously scheduled, she return precautions given, verbalized understanding and agreement to today's plan.         [name], PA-C 02/26/15 1721  Alvira Monday, MD 02/27/15 1243

## 2015-02-28 ENCOUNTER — Telehealth: Payer: Self-pay | Admitting: Allergy and Immunology

## 2015-02-28 NOTE — Telephone Encounter (Signed)
Phone call to Lori Miles's Mom.  Left message on her voicemail.

## 2015-02-28 NOTE — Telephone Encounter (Signed)
Mom calling back to see status of Medication. See prior TC pls advise

## 2015-02-28 NOTE — Telephone Encounter (Signed)
Advised Mom that Prior Authorization was denied for Pulmicort 1 mg.  Mom states that she [facility] because she was still cough, gasping for breath, turning red in the face , wheezing, trouble sleeping due to symptoms, sore throat with rawness.  Mom states she is coughing up white mucous, but sometimes it is green.  She is giving her Cepacol mix berry lozenges to try and help with the throat rawness.  Mom states she is already using Pulmicort 0.5 mg TID to QID and using Albuterol nebs every 4-6 hours around the clock, and sometimes using an extra albuterol treatment 1 hour after the last.  Mom doesn't know if Lori Miles is running fever today, because she just got home from school.  Mom states that her pharmacy told her if the doctor did a letter, then it would be approved.  This letter she is speaking of may be an appeal letter.  Lori Miles has an appointment with Dr. Willa RoughHicks on 03/08/15@3 :45 PM and an [facility] 03/15/15@3 :15 PM.  Please advise what to do.

## 2015-03-01 ENCOUNTER — Ambulatory Visit: Payer: Medicare Other | Admitting: Allergy and Immunology

## 2015-03-01 ENCOUNTER — Other Ambulatory Visit: Payer: Self-pay | Admitting: *Deleted

## 2015-03-01 ENCOUNTER — Other Ambulatory Visit: Payer: Self-pay | Admitting: Allergy and Immunology

## 2015-03-01 DIAGNOSIS — G4700 Insomnia, unspecified: Secondary | ICD-10-CM

## 2015-03-01 DIAGNOSIS — G40309 Generalized idiopathic epilepsy and epileptic syndromes, not intractable, without status epilepticus: Secondary | ICD-10-CM

## 2015-03-01 DIAGNOSIS — G40209 Localization-related (focal) (partial) symptomatic epilepsy and epileptic syndromes with complex partial seizures, not intractable, without status epilepticus: Secondary | ICD-10-CM

## 2015-03-01 MED ORDER — TRAZODONE HCL 50 MG PO TABS: ORAL_TABLET | ORAL | Status: DC

## 2015-03-01 MED ORDER — DEPAKOTE 500 MG PO TBEC: DELAYED_RELEASE_TABLET | ORAL | Status: DC

## 2015-03-01 NOTE — Telephone Encounter (Signed)
Phone call to Mom---[facility] to [facility] today at 345pm.  Mom is at a dental appointment now likely from 2 to 3 pm.  She will do best to bring her---Lori Miles is currently in school.

## 2015-03-01 NOTE — Telephone Encounter (Signed)
Called Mom second time left message.  Third call to Mom--reviewed how Lori Miles is doing.  She reports Lori Miles was hospitalized after thanksgiving for Pneumonia now completed  Antibiotics and Prednisone with only improvement but not 100%.Lori Miles is at school this morning and is maintaining increased Pulmicort with Albuterol and other meds.  Reviewed with Mom having sooner appointment then 21st in our office and will call her back this afternoon for appointment time.  And will review ability to obtain 1mg  Pulmicort through appeal.  Mom agreed with plan and she will keep Pulmonology appointment as scheduled.

## 2015-03-02 ENCOUNTER — Telehealth: Payer: Self-pay

## 2015-03-02 MED ORDER — ALBUTEROL SULFATE (2.5 MG/3ML) 0.083% IN NEBU: 2.5000 mg | INHALATION_SOLUTION | RESPIRATORY_TRACT | Status: DC | PRN

## 2015-03-02 NOTE — Telephone Encounter (Signed)
[facility] and Pulmicort 1mg .

## 2015-03-02 NOTE — Telephone Encounter (Signed)
Spoke to mom to see how Lori Miles did through the night.  Mom reports no disruption of sleep therefore did not use albuterol.  She reports using the Pulmicort 3 x daily and Albuterol q 4 hours prn.  Lori Miles went to school today.  Mom states she has continued cough and wheeze but "not as bad".  She feels as though Lori Miles has improved some.  Mom is concerned she is going to run out of Albuterol and Pulmicort by the weekend.  She states pharmacy advised her we were running 1mg  [facility] (B should cover med).  Mom states we need to complete the "DWO and Authorization paper that pharmacy faxed yesterday".    Reviewed with Dr. Willa RoughHicks.  She requested to speak to PMD (Avbuere) [phone].  I left message on his voicemail for him to return her call.  Albuterol rx sent to pharmacy and will complete paperwork for Pulmicort 1mg .  If we can't obtain authorization today will have to send in 0.5mg .

## 2015-03-03 NOTE — Telephone Encounter (Signed)
I called [facility] on [address] to inquire about the status on Pulmicort.  I spoke to [place] and he confirmed that the Pulmicort was approved.  He will need to order it.  Kathlene NovemberMike stated that he had contacted Mom.

## 2015-03-07 ENCOUNTER — Other Ambulatory Visit: Payer: Self-pay

## 2015-03-07 ENCOUNTER — Telehealth: Payer: Self-pay | Admitting: Allergy and Immunology

## 2015-03-07 MED ORDER — PULMICORT 0.5 MG/2ML IN SUSP: 0.5000 mg | Freq: Three times a day (TID) | RESPIRATORY_TRACT | Status: DC

## 2015-03-07 MED ORDER — BUDESONIDE 0.5 MG/2ML IN SUSP: 0.5000 mg | Freq: Four times a day (QID) | RESPIRATORY_TRACT | Status: DC

## 2015-03-07 NOTE — Telephone Encounter (Signed)
Rx sent for four times daily and mom notified.  She will follow-up with Dr. Willa RoughHicks tomorrow in [place].

## 2015-03-07 NOTE — Telephone Encounter (Signed)
Mom call and said that the albuterol for the neb. Solution 1.0 is on back order. Mom said that she needs some albuterol because she is out. If you do the 0.5 and write it differ she can get if before 03/13/2015. Her number is [phone]. And pharmacy is [facility] [address].

## 2015-03-08 ENCOUNTER — Encounter: Payer: Self-pay | Admitting: Allergy and Immunology

## 2015-03-08 ENCOUNTER — Ambulatory Visit (INDEPENDENT_AMBULATORY_CARE_PROVIDER_SITE_OTHER): Payer: Medicare Other | Admitting: Allergy and Immunology

## 2015-03-08 VITALS — BP 120/76 | HR 98 | Temp 98.0°F | Resp 20

## 2015-03-08 DIAGNOSIS — J310 Chronic rhinitis: Secondary | ICD-10-CM

## 2015-03-08 DIAGNOSIS — R0683 Snoring: Secondary | ICD-10-CM | POA: Diagnosis not present

## 2015-03-08 DIAGNOSIS — J4530 Mild persistent asthma, uncomplicated: Secondary | ICD-10-CM | POA: Diagnosis not present

## 2015-03-08 MED ORDER — OLOPATADINE HCL 0.6 % NA SOLN: NASAL | Status: DC

## 2015-03-08 NOTE — Patient Instructions (Addendum)
   Decrease Pulmicort to 0.5mg  (one vial) in the morning and 1mg  in the evening for the next week then further decrease per Pulmonology.  Consider change to Pulmicort flexihaler.  Use Patanase 1 spray twice daily, with Saline nasal wash prior to Patanase.  Continue Singulair and Zyrtec daily.  Albuterol neb as needed every 4 hours for cough or wheeze.  Consider repeat aeroallergen testing.  Follow-up in 3 months or sooner if needed.

## 2015-03-08 NOTE — Progress Notes (Signed)
FOLLOW UP NOTE  RE: Lori Miles MRN: [id number] DOB: 1996/02/03 [facility] [place] [address] [place] [address] Date of Office Visit: 03/08/2015  Subjective:  Lori Miles is a 19 y.o. female who presents today for Cough and Follow-up  Assessment:   1. Mild persistent asthma.   2. Snoring, question of sleep disturbance.  3. Chronic rhinitis, possible new aeroallergen hypersensitivities.  4.      Complex medical history. Plan:   Meds ordered this encounter  Medications  . Olopatadine HCl 0.6 % SOLN    Sig: Use one spray twice daily for stuffy nose or drainage.    Dispense:  1 Bottle    Refill:  5   Patient Instructions   1.  Decrease Pulmicort to 0.5mg  (one vial) in the morning and  in the evening for the next week then further decrease per Pulmonology. 2.  Consider change to Pulmicort flexihaler. 3.  Use Patanase 1 spray twice daily, with Saline nasal wash prior to Patanase. 4.  Continue Singulair and Zyrtec daily. 5.. Albuterol neb as needed every 4 hours for cough or wheeze. 6.  Consider repeat aeroallergen testing. 7.  Follow-up in 3 months or sooner if needed.  HPI: Lori Miles returns to the office with her Mother.  Since her last visit she has had a few ED visits and a hospitalization related to pneumonia (late Nov) but more for not tolerating oral intake/meds (completed Prednisone and antibiotics).  Mom has continued to report cough and congestion occasionally associated with noisy breathing, possibly wheezing more at night which appears to be significant snoring.  We have increased Lori Miles's Pulmicort neb dose which had been helpful in the past and also the recommendation of the ED physician.  Mom now reports improvement with no further wheeze, only rare cough and no disrupted sleep the last few nights.  She has not needed albuterol in the recent nights either.  Lori Miles saw Dr. Annalee Genta, who recommended Flonase, however Mom has  difficulty administering.  She has been able to use Patanase intermittently and Saline nasal spray which Mom feels are both helpful.  There may still be slight nasal congestion--more at night.  There is no further fever, difficulty in breathing or other concerns.  She continues to follow with her other physicians. Reports sleep and activity are normal now.  Here appetite is normal and maintains other medications as previously.  She last had allergy testing in 1999, which did not show any hypersensitivity and recent sleep study did not show any sleep apnea per Mom's report, but Mom was concerned about the application of the testing modalities by the tech.  Current Medications: 1.  Pulmicort  twice daily. 2.  Patanase one spray once daily as needed. 3.  Singulair  daily. 4.  Zyrtec  once daily. 5.  Albuterol as needed. 6.  Continues Clonidine, Depakote, Levsin, Seasonique, Melatonin, MVI, Trazodone, Trimethoprim and Vit C daily.  Drug Allergies: Allergies  Allergen Reactions  . Abilify [Aripiprazole] Swelling and Palpitations  . Seroquel [Quetiapine Fumarate] Palpitations  . Amoxicillin-Pot Clavulanate Hives  . Keppra [Levetiracetam] Other (See Comments)    Aggressive behavior     Objective:   Filed Vitals:   03/08/15 1527  BP: 120/76  Pulse: 98  Temp: 98 F (36.7 C)  Resp: 20   SpO2 Readings from Last 1 Encounters:  03/08/15 98%   Physical Exam  Constitutional:  Alert interactive in no acute distress without noisy breathing.  HENT:  Head: Atraumatic.  Right Ear: Tympanic membrane and ear canal normal.  Left Ear: Tympanic membrane and ear canal normal.  Nose: Mucosal edema (scant clear mucus) present. No epistaxis.  Mouth/Throat: Oropharynx is clear and moist and mucous membranes are normal. No oropharyngeal exudate, posterior oropharyngeal edema or posterior oropharyngeal erythema.  Neck: Neck supple.  Cardiovascular: Normal rate, S1 normal and S2 normal.   No  murmur heard. Pulmonary/Chest: Effort normal. She has no wheezes. She has no rhonchi. She has no rales.  Excellent aeration without adventious breath sounds.  Lymphadenopathy:    She has no cervical adenopathy.    Diagnostics: Spirometry:  FVC 2.20--88%;  FEV1 2.10--92%.    Christiana Gurevich M. Willa RoughHicks, MD  cc: Dorrene GermanAVBUERE,EDWIN A, MD

## 2015-03-09 ENCOUNTER — Telehealth: Payer: Self-pay

## 2015-03-09 MED ORDER — ALBUTEROL SULFATE (2.5 MG/3ML) 0.083% IN NEBU
2.5000 mg | INHALATION_SOLUTION | RESPIRATORY_TRACT | Status: DC | PRN
Start: 2015-03-09 — End: 2015-03-28

## 2015-03-09 NOTE — Telephone Encounter (Signed)
[name] mom would like a nurse to give her a call.

## 2015-03-09 NOTE — Telephone Encounter (Signed)
Spoke with mother who said that she had a question on the nasal spay. Advised patient it was one spray twice daily. Mother also asked if we could send in another box of albuterol for her day. Spoke with Dr Willa RoughHicks and she said we could send in one more box. Albuterol sent to patients pharmacy.

## 2015-03-14 NOTE — Telephone Encounter (Signed)
Patient didn't get to come in to be seen by Dr. Willa RoughHicks until 03/08/15.

## 2015-03-15 ENCOUNTER — Encounter: Payer: Self-pay | Admitting: Internal Medicine

## 2015-03-15 ENCOUNTER — Ambulatory Visit (INDEPENDENT_AMBULATORY_CARE_PROVIDER_SITE_OTHER): Payer: Medicare Other | Admitting: Internal Medicine

## 2015-03-15 VITALS — BP 130/80 | HR 125 | Ht <= 58 in | Wt 180.0 lb

## 2015-03-15 DIAGNOSIS — J45991 Cough variant asthma: Secondary | ICD-10-CM | POA: Insufficient documentation

## 2015-03-15 DIAGNOSIS — N390 Urinary tract infection, site not specified: Secondary | ICD-10-CM | POA: Diagnosis not present

## 2015-03-15 MED ORDER — PANTOPRAZOLE SODIUM 40 MG PO TBEC: 40.0000 mg | DELAYED_RELEASE_TABLET | Freq: Every day | ORAL | Status: DC

## 2015-03-15 MED ORDER — FAMOTIDINE 20 MG PO TABS: ORAL_TABLET | ORAL | Status: DC

## 2015-03-15 MED ORDER — PROMETHAZINE-CODEINE 6.25-10 MG/5ML PO SYRP: 5.0000 mL | ORAL_SOLUTION | ORAL | Status: DC | PRN

## 2015-03-15 NOTE — Progress Notes (Signed)
Subjective:    Patient ID: Lori SchickJasmine Miles, female    DOB: 09/24/1995,     MRN: [id number]  HPI  19 yobf special ed pt ? Autistic/ never smoker followed by Dr Willa RoughHicks / Kozlow referred to pulmonary clinic 03/15/2015 s/p admit   Admit date: 02/14/2015 Discharge date: 02/15/2015     Recommendations for Outpatient Follow-up:  Note given for school   Discharge Diagnoses:  Principal Problem:  CAP (community acquired pneumonia) Active Problems:  Seizure disorder (HCC)  Asthma  Chronic constipation  Interstitial cystitis  Obesity (BMI 30-39.9)  Mental retardation, 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)  Autism spectrum disorder  Acute respiratory failure (HCC)  Nausea & vomiting   Discharge Condition: improved  Diet recommendation: regular  Filed Weights   02/14/15 1901  Weight: 78.699 kg (173 lb 8 oz)    History of present illness:  This is a 19 year old female patient without Autism spectrum disorder/developmental delay, chronic interstitial cystitis, chronic constipation, seizure disorder, and asthma. Patient's mother's the primary historian and reports for about 3 weeks patient has had increased cough. Patient does have chronic wheezing and occasional coughing but she has been progressively worsening and now has green productive sputum. She was evaluated as an outpatient by primary care physician and started on antibiotics and steroids and nebs without any improvement in her symptoms. The patient has been experiencing nausea and vomiting specially after coughing episode. Because of persistent symptomatology and patient inability to keep medications down her mother brought her to the ER for further evaluation.  In the ER the patient had low-grade fever 99.3, BP was stable at 114/73 pulse was 98 and regular respirations 20. Room air saturations were 98%. Chest x-ray completed on 11/28 revealed new airspace opacification in the right lower lobe worrisome for pneumonia. In the  ER today the patient was given duo neb along with a liter of fluid and Zofran with improvement in her wheezing but patient continued to have issues with nausea after eating although she has not yet vomited since attempting solid food earlier today. Chest x-ray concerning for aspiration component especially with history of chronic wheezing and recurrent cough and what sounds like symptoms consistent with vocal cord dysfunction.  Hospital Course:  [facility] -steroid taper -finish course of abx -much improved -no aspiration issue      03/15/2015 1st [facility] office visit/ Lori Miles  On pulmicort 0.5 qid and prn saba hasn't used latter x 24 h Chief Complaint  Patient presents with  . Pulmonary Consult    Hospital discharge stating pt had pneumonia. Mother states breathing problems since August 2016. Mother states pt increased SOB, wheezing, and "gasping for air" at night   had been on chronic prednisone x months but apparently no more pred since early December 2016 (mother an nurse  held up bottle of doxy confused with pred, and singulair confused with gerd rx) - noct wheeze no better on saba per mother. When pt is asked if she's trouble breathing she nods know, when asked where she has pain she point to her neck (middle to upper) and mother does report severe dry coughing fits esp supine  The mother, the main caretaker, failed  to answer a single question asked in a straightforward manner, tending to go off on tangents or answer questions with ambiguous medical terms or diagnoses and seemed aggravated to the point of anger when asked the same question more than once for clarification.   As far as I could tell through the mother >>  No obvious  patterns in day to day or daytime variabilty or assoc excess/ purulent sputum or mucus plugs  or cp or chest tightness, subjective wheeze overt sinus or hb symptoms. No unusual exp hx or h/o childhood pna/ asthma or knowledge of premature birth.  Also  denies any obvious fluctuation of symptoms with weather or environmental changes or other aggravating or alleviating factors except as outlined above   Current Medications, Allergies, Complete Past Medical History, Past Surgical History, Family History, and Social History were reviewed in [employer] record.          .    Review of Systems  Constitutional: Negative for fever, chills and unexpected weight change.  HENT: Negative for congestion, dental problem, ear pain, nosebleeds, postnasal drip, rhinorrhea, sinus pressure, sneezing, sore throat, trouble swallowing and voice change.   Eyes: Negative for visual disturbance.  Respiratory: Positive for shortness of breath and wheezing. Negative for cough and choking.   Cardiovascular: Negative for chest pain and leg swelling.  Gastrointestinal: Negative for vomiting, abdominal pain and diarrhea.  Genitourinary: Negative for difficulty urinating.  Musculoskeletal: Negative for arthralgias.  Skin: Negative for rash.  Neurological: Negative for tremors, syncope and headaches.  Hematological: Does not bruise/bleed easily.       Objective:   Physical Exam   amb young obese  bf nad with prominent pseudowheeze  Wt Readings from Last 3 Encounters:  03/15/15 180 lb (81.647 kg)  02/14/15 173 lb 8 oz (78.699 kg)  02/13/15 175 lb (79.379 kg)    Vital signs reviewed    HEENT: nl dentition, turbinates, and oropharynx. Nl external ear canals without cough reflex   NECK :  without JVD/Nodes/TM/ nl carotid upstrokes bilaterally   LUNGS: no acc muscle use,  Nl contour chest which is clear to A and P bilaterally without cough on insp or exp maneuvers   CV:  RRR  no s3 or murmur or increase in P2, no edema   ABD:  soft and nontender with nl inspiratory excursion in the supine position. No bruits or organomegaly, bowel sounds nl  MS:  Nl gait/ ext warm without deformities, calf tenderness, cyanosis or clubbing No  obvious joint restrictions   SKIN: warm and dry without lesions    NEURO:  alert, approp, nl sensorium with  no motor deficits        I personally reviewed images and agree with radiology impression as follows:  CXR:  02/26/15  No acute abnormality noted.          Assessment & Plan:

## 2015-03-15 NOTE — Patient Instructions (Addendum)
Pantoprazole (protonix) 40 mg   Take  30-60 min before first meal of the day and Pepcid (famotidine)  20 mg one @  bedtime until return to office - this is the best way to tell whether stomach acid is contributing to your problem.    GERD (REFLUX)  is an extremely common cause of respiratory symptoms just like yours , many times with no obvious heartburn at all.    It can be treated with medication, but also with lifestyle changes including elevation of the head of your bed (ideally with 6 inch  bed blocks),  Smoking cessation, avoidance of late meals, excessive alcohol, and avoid fatty foods, chocolate, peppermint, colas, red wine, and acidic juices such as orange juice.  NO MINT OR MENTHOL PRODUCTS SO NO COUGH DROPS  USE SUGARLESS CANDY INSTEAD (Jolley ranchers or Stover's or Life Savers) or even ice chips will also do - the key is to swallow to prevent all throat clearing. NO OIL BASED VITAMINS - use powdered substitutes.   Reduce the pulmocort 0.5 twice daily   For cough > phenergan with codeine  2 tsp every 4 hours as needed   For wheezing > albuterol 2.5 mg every 4 hours as needed  F/u in 4 weeks, sooner if needed, with all medications in hand

## 2015-03-16 ENCOUNTER — Encounter: Payer: Self-pay | Admitting: Internal Medicine

## 2015-03-16 NOTE — Assessment & Plan Note (Signed)
Would like to avoid macrodantin in the setting of already having difficult to control resp issues but the mother informs me "nothing else works" so we'll need to monitor her for ILD going forward and not assume all sob/ cough due to asthma/ uacs

## 2015-03-16 NOTE — Assessment & Plan Note (Signed)
Allergy testing 07/21/97 completely neg Spirometry 03/15/2015 completely nl including fef 25 74 off saba x > 24 h NO 03/15/2015  =  Not able to perform   Although she may well have asthma, she is not "responding" to very high doses of ICS and her main finding is all upper airway as is the pt's (but definitely not the mom's) concern.  I expressed point blank to the mother my concern that it's extremely challenging even for a nurse to assess upper vs lower resp complaints and that there was a definite risk of over treatment for asthma in this setting leading to excess steroids in multiple forms which needs to be avoided at all costs given her daughters wt gain and ? cushingnoid appearance  rec instead focus on controlling the upper airway at this point > rec cyclical cough rx/ max gerd rx/ min ICS = pulmicort 0.5 mg bid with albuterol prn   I had an extended discussion with the mother  reviewing all relevant studies completed to date and  lasting  35 minutes of a 60 minute visit    Each maintenance medication was reviewed in detail including most importantly the difference between maintenance and prns and under what circumstances the prns are to be triggered using an action plan format that is not reflected in the computer generated alphabetically organized AVS.    Please see instructions for details which were reviewed in writing and the patient given a copy highlighting the part that I personally wrote and discussed at today's ov.

## 2015-03-16 NOTE — Assessment & Plan Note (Signed)
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  Lab Results  Component Value Date   TSH 0.507 10/07/2011     Contributing to gerd tendency/ doe/reviewed the need and the process to achieve and maintain neg calorie balance > defer f/u primary care including intermittently monitoring thyroid status

## 2015-03-17 ENCOUNTER — Emergency Department (HOSPITAL_COMMUNITY)
Admission: EM | Admit: 2015-03-17 | Discharge: 2015-03-17 | Disposition: A | Discharge status: Home / Self Care | Payer: Medicare Other | Attending: Emergency Medicine | Admitting: Emergency Medicine

## 2015-03-17 ENCOUNTER — Encounter (HOSPITAL_COMMUNITY): Payer: Self-pay | Admitting: Emergency Medicine

## 2015-03-17 ENCOUNTER — Emergency Department (HOSPITAL_COMMUNITY): Payer: Medicare Other

## 2015-03-17 DIAGNOSIS — R062 Wheezing: Secondary | ICD-10-CM | POA: Diagnosis present

## 2015-03-17 DIAGNOSIS — Z87448 Personal history of other diseases of urinary system: Secondary | ICD-10-CM | POA: Diagnosis not present

## 2015-03-17 DIAGNOSIS — Z7952 Long term (current) use of systemic steroids: Secondary | ICD-10-CM | POA: Diagnosis not present

## 2015-03-17 DIAGNOSIS — R112 Nausea with vomiting, unspecified: Secondary | ICD-10-CM | POA: Insufficient documentation

## 2015-03-17 DIAGNOSIS — K5900 Constipation, unspecified: Secondary | ICD-10-CM | POA: Diagnosis not present

## 2015-03-17 DIAGNOSIS — F79 Unspecified intellectual disabilities: Secondary | ICD-10-CM | POA: Diagnosis not present

## 2015-03-17 DIAGNOSIS — J45901 Unspecified asthma with (acute) exacerbation: Secondary | ICD-10-CM | POA: Insufficient documentation

## 2015-03-17 DIAGNOSIS — F840 Autistic disorder: Secondary | ICD-10-CM | POA: Diagnosis not present

## 2015-03-17 DIAGNOSIS — Z7951 Long term (current) use of inhaled steroids: Secondary | ICD-10-CM | POA: Insufficient documentation

## 2015-03-17 DIAGNOSIS — J069 Acute upper respiratory infection, unspecified: Secondary | ICD-10-CM | POA: Diagnosis not present

## 2015-03-17 DIAGNOSIS — Z79899 Other long term (current) drug therapy: Secondary | ICD-10-CM | POA: Insufficient documentation

## 2015-03-17 LAB — RAPID STREP SCREEN (MED CTR MEBANE ONLY): Streptococcus, Group A Screen (Direct): NEGATIVE

## 2015-03-17 MED ORDER — IPRATROPIUM BROMIDE 0.02 % IN SOLN
0.5000 mg | Freq: Once | RESPIRATORY_TRACT | Status: AC
  Administered 2015-03-17: 0.5 mg via RESPIRATORY_TRACT
  Filled 2015-03-17: qty 2.5

## 2015-03-17 MED ORDER — PREDNISONE 20 MG PO TABS: 40.0000 mg | ORAL_TABLET | Freq: Every day | ORAL | Status: DC

## 2015-03-17 MED ORDER — ALBUTEROL SULFATE (2.5 MG/3ML) 0.083% IN NEBU
5.0000 mg | INHALATION_SOLUTION | Freq: Once | RESPIRATORY_TRACT | Status: AC
  Administered 2015-03-17: 5 mg via RESPIRATORY_TRACT
  Filled 2015-03-17: qty 6

## 2015-03-17 MED ORDER — DEXAMETHASONE SODIUM PHOSPHATE 10 MG/ML IJ SOLN
10.0000 mg | Freq: Once | INTRAMUSCULAR | Status: AC
  Administered 2015-03-17: 10 mg via INTRAMUSCULAR
  Filled 2015-03-17: qty 1

## 2015-03-17 NOTE — Discharge Instructions (Signed)
1. Medications: prednisone, albuterol inhaler, usual home medications 2. Treatment: rest, drink plenty of fluids  3. Follow Up: please followup with your primary doctor next week for discussion of your diagnoses and further evaluation after today's visit; if you do not have a primary care doctor use the resource guide provided to find one; please return to the ER for high fever, increased wheezing, persistent vomiting, new or worsening symptoms   Upper Respiratory Infection, Adult Most upper respiratory infections (URIs) are caused by a virus. A URI affects the nose, throat, and upper air passages. The most common type of URI is often called "the common cold." HOME CARE   Take medicines only as told by your doctor.  Gargle warm saltwater or take cough drops to comfort your throat as told by your doctor.  Use a warm mist humidifier or inhale steam from a shower to increase air moisture. This may make it easier to breathe.  Drink enough fluid to keep your pee (urine) clear or pale yellow.  Eat soups and other clear broths.  Have a healthy diet.  Rest as needed.  Go back to work when your fever is gone or your doctor says it is okay.  You may need to stay home longer to avoid giving your URI to others.  You can also wear a face mask and wash your hands often to prevent spread of the virus.  Use your inhaler more if you have asthma.  Do not use any tobacco products, including cigarettes, chewing tobacco, or electronic cigarettes. If you need help quitting, ask your doctor. GET HELP IF:  You are getting worse, not better.  Your symptoms are not helped by medicine.  You have chills.  You are getting more short of breath.  You have brown or red mucus.  You have yellow or brown discharge from your nose.  You have pain in your face, especially when you bend forward.  You have a fever.  You have puffy (swollen) neck glands.  You have pain while swallowing.  You have white  areas in the back of your throat. GET HELP RIGHT AWAY IF:   You have very bad or constant:  Headache.  Ear pain.  Pain in your forehead, behind your eyes, and over your cheekbones (sinus pain).  Chest pain.  You have long-lasting (chronic) lung disease and any of the following:  Wheezing.  Long-lasting cough.  Coughing up blood.  A change in your usual mucus.  You have a stiff neck.  You have changes in your:  Vision.  Hearing.  Thinking.  Mood. MAKE SURE YOU:   Understand these instructions.  Will watch your condition.  Will get help right away if you are not doing well or get worse.   This information is not intended to replace advice given to you by your health care provider. Make sure you discuss any questions you have with your health care provider.   Document Released: 08/21/2007 Document Revised: 07/19/2014 Document Reviewed: 06/09/2013 Elsevier Interactive Patient Education 2016 [employer]   Emergency Department Resource Guide 1) Find a Doctor and Pay Out of Pocket Although you won't have to find out who is covered by your insurance plan, it is a good idea to ask around and get recommendations. You will then need to call the office and see if the doctor you have chosen will accept you as a new patient and what types of options they offer for patients who are self-pay. Some doctors offer discounts  or will set up payment plans for their patients who do not have insurance, but you will need to ask so you aren't surprised when you get to your appointment.  2) Contact Your Local Health Department Not all health departments have doctors that can see patients for sick visits, but many do, so it is worth a call to see if yours does. If you don't know where your local health department is, you can check in your phone book. The CDC also has a tool to help you locate your state's health department, and many state websites also have listings of all of their local  health departments.  3) Find a Walk-in Clinic If your illness is not likely to be very severe or complicated, you may want to try a walk in clinic. These are popping up all over the country in pharmacies, drugstores, and shopping centers. They're usually staffed by nurse practitioners or physician assistants that have been trained to treat common illnesses and complaints. They're usually fairly quick and inexpensive. However, if you have serious medical issues or chronic medical problems, these are probably not your best option.  No Primary Care Doctor: - Call Health Connect at  [phone] - they can help you locate a primary care doctor that  accepts your insurance, provides certain services, etc. - Physician Referral Service- [phone]  Chronic Pain Problems: Organization         Address  Phone   Notes  [facility]  [phone] Patients need to be referred by their primary care doctor.   Medication Assistance: Organization         Address  Phone   Notes  [facility] Medication [facility] [address] [place] [place], [place] [address] [phone] --Must be a resident of [facility] -- Must have NO insurance coverage whatsoever (no Medicaid/ Medicare, etc.) -- The pt. MUST have a primary care doctor that directs their care regularly and follows them in the community   [facility]  [phone]   [employer]  [phone]    Agencies that provide inexpensive medical care: Organization         Address  Phone   Notes  [facility]  [phone]   [facility]    [phone]   [facility] [address] [place], [place] [address] [phone]   [facility] [address] [place] [place] [phone]   Planned Parenthood    [phone]   [facility]    [phone]   [facility] and [facility]  [address], [place] Phone:   [phone], Fax:  [phone] Hours of Operation:  9 am - 6 pm, M-F.  Also accepts Medicaid/Medicare and self-pay.  [facility]  [address], [place] Phone: [phone], Fax: [phone]. Hours of Operation:  8:30 am - 5:30 pm, M-F.  Also accepts Medicaid and self-pay.  [facility] [place] [address], [place] Phone: [phone]   [facility] [address] Natasha Bence [place], [place] [phone], Ext. [phone] Mondays & Thursdays: 7-9 AM.  First 15 patients are seen on a first come, first serve basis.    Medicaid-accepting [facility] Providers:  Organization         Address  Phone   Notes  [facility] [address], Ervin Knack, [place] (  [phone] Also accepts self-pay patients.  [facility] [address] Laurell Josephs [name], [place]  [phone]   [facility] [address], [place] [phone]   [facility] [address] [place] [phone]   Renaye Rakers [address], [place]   [phone] Only accepts [place] Access [place] patients after they have their name applied to their card.   Self-Pay (no insurance) in [place]:  Organization         Address  Phone   Notes  Sickle Cell Patients, [facility] [address] [place], [place] [phone]   [facility] [address] [place], [place] [phone]   [facility] [place]  [address], [place] [phone]   [facility]/Dr. Osei-Bonsu  [address], [place] or [address], [place] [phone] Phone number for both [place] and [place] locations is the same.  [facility] [address] [place] [phone]   [facility] [address] [place] or [address] [phone]   [facility] [address] [place] [phone], phone; [phone], fax Sees patients 1st and 3rd Saturday of every month.  Must not qualify for public or private insurance (i.e. Medicaid, Medicare, [place] Health Choice, Veterans' Benefits)  Household income should be no more than 200% of the poverty level The clinic cannot treat you if you are pregnant or think you are pregnant  Sexually transmitted diseases are not treated at the clinic.    Dental Care: Organization         Address  Phone  Notes  [facility] Department of [facility] [address] [place], [place] [phone] Accepts children up to age 72 who are enrolled in [place] or [place] Health Choice; pregnant women with a Medicaid card; and children who have applied for Medicaid or [place] Health Choice, but were declined, whose parents can pay a reduced fee at time of service.  [facility] Department of [facility]  [address], [place] [phone] Accepts children up to age 57 who are enrolled in [place] or [place] Health Choice; pregnant women with a Medicaid card; and children who have applied for Medicaid or  Health Choice, but were declined, whose parents can pay a reduced fee at time of service.  [facility]  [address] [place], [place] [phone] Patients are seen by appointment only. Walk-ins are not accepted. [facility] will see patients 70 years of age and older. Monday - Tuesday (8am-5pm) Most Wednesdays (8:30-5pm) $30 per visit, cash only  [facility]  [address], [facility] [phone] Patients are seen by appointment only. Walk-ins are not accepted. [facility] will see patients 32 years of age and older. One Wednesday Evening (Monthly: Volunteer Based).  $30 per visit, cash only  [employer]  [phone] for adults;  Children under age 99, call Graduate Pediatric Dentistry at [phone]. Children aged 57-14, please call [phone] to request a pediatric application.  Dental services are provided in all areas of dental care including fillings, crowns and bridges, complete and partial dentures, implants, gum treatment, root canals, and extractions. Preventive care is also  provided. Treatment is provided to both adults and children. Patients are selected via a lottery and there is often a waiting list.   [facility] [address] [place]  [phone] [email or web address]   [facility] [address] [place], [place] [phone], Ext. [phone] Second and Fourth Thursday of each month, opens at 6:30 AM; Clinic ends at 9 AM.  Patients are seen on a first-come first-served basis, and a limited number are seen during each clinic.   [facility]  [address] Ether Griffins [place], [place] [phone]   Eligibility Requirements You must have lived in [place], [place], or [place] counties for at least the last three months.   You cannot be eligible for state or federal sponsored [place], including [employer], [place], or [employer].   You generally cannot be eligible for healthcare insurance through your employer.    How to apply: Eligibility screenings are held every Tuesday and Wednesday afternoon from 1:00 pm until 4:00 pm. You do not need an appointment for the interview!  [facility] [address] [place], [place] [phone]   [facility] Health Department  [phone]   [facility] Health Department  [phone]   [facility] Health Department  [phone]    Behavioral Health Resources in the Community: Intensive Outpatient Programs Organization         Address  Phone  Notes  [facility] Services [address] [place], [place] [phone]   [facility] Outpatient [address] [place], [place] [phone]   [facility] [address] [place], [place]  [phone]   [facility] [address]  [place], [place] [phone] or [phone]   Substance Abuse Resources Organization         Address  Phone  Notes  [facility] Drug Services  [phone]   [facility]  [phone]   [facility]  [phone]   Floydene Flock  [phone]   Residential & Outpatient Substance Abuse Program  [phone]   Psychological Services Organization         Address  Phone  Notes  [facility] Behavioral Health  [phone]   [facility]  [phone]   [facility] Mental [facility] [address], [place] [phone] or [phone]    Mobile Crisis Teams Organization         Address  Phone  Notes  [facility], Mobile Crisis Care Unit  [phone]   [facility]  [address] [place], [place] [phone]   Doristine Locks [address] [place] [phone]    Self-Help/Support Groups Organization         Address  Phone             Notes  [facility] [place] - variety of support groups  336- I7437963 Call for more information  Narcotics Anonymous (NA), [facility] [address], [employer] [place]  2 meetings at this location   Statistician         Address  Phone  Notes  [facility] [address],    [place]  [phone]   [facility]  [address] [place] [address], [place], [place] [phone]   [facility] [address] [place], [place] [phone] Admissions: 8am-3pm M-F  [facility] [address]    [place], [place] [phone]   [facility] [address] Starling Manns [place], [place] [phone]   [facility] [address]  [place], [place] [phone]   [facility] - Intensive  Outpatient [address] Laurell Josephs [address], [place], [place] [phone]   [facility] ([facility]iction [facility] [address] [place]  [place], [place] [phone] or [phone]   [facility] ([facility]) [address], [place], [place] [phone] Accepts Medicaid  [facility] [place] [address],  [place] [phone] Substance Abuse/Addiction Treatment   [facility] Organization         Address  Phone  Notes  [facility]  6186671022   Angie Fava, PhD [address] Ervin Knack [place], [place]   [phone] or [phone]   [facility] Behavioral   [address] [place], [place] [phone]   [facility] [address] [place], [place] [phone] Insurance/Medicaid/sponsorship through [facility] and Families [address]                                    [place], [place] [phone] Therapy/tele-psych/case  [facility] [address], [place] [phone]    Dr. Lolly Mustache  [phone]   [facility]  [employer] [facility] Dept. 1) [address] [place] 2) [address], [place] 3)  [address], [place] [phone]  [phone]   [facility] Child Abuse Hotline 539-157-8238 or [phone] (After Hours)

## 2015-03-17 NOTE — ED Notes (Signed)
Patient coming from home with c/o of wheezing since Tuesday with productive cough.  Patient was diagnosed with pneumonia on 02/13/15.

## 2015-03-17 NOTE — ED Provider Notes (Signed)
CSN: [id number]     Arrival date & time 03/17/15  1058 History   First MD Initiated Contact with Patient 03/17/15 1100     Chief Complaint  Patient presents with  . Wheezing    HPI   Lori Miles is a 19 y.o. female with a PMH of CP, asthma, seizures who presents to the ED with nasal congestion, sore throat, cough productive of green sputum, and wheezing since Tuesday. Her mom is present at bedside, and reports her symptoms have been constant. She denies exacerbating factors. She has tried OTC cough and cold medication and her home albuterol inhaler without significant symptom relief. She denies fever, chills. She reports intermittent vomiting with eating.    Past Medical History  Diagnosis Date  . CP (cerebral palsy) (HCC)     mom denies  . Asthma   . Mental deficiency     hx of aggressive behavior,impaired speech   . Constipation   . Allergy   . Autism     mom states no actual dx, but admits she has some symptoms  . Frequency-urgency syndrome      some incontinence ; wears pull-ups  . Seizure (HCC)     most recent sz " at the end of summer"  . Interstitial cystitis   . Bronchitis    Past Surgical History  Procedure Laterality Date  . Cystoscopy    . Cystoscopy with hydrodistension and biopsy  04/14/2012    Procedure: CYSTOSCOPY/BIOPSY/HYDRODISTENSION;  Surgeon: Lindaann Slough, MD;  Location: [facility];  Service: Urology;;  instillation of marcaine and pyridium   Family History  Problem Relation Age of Onset  . Seizures Mother   . Seizures Sister     1 sister has   Social History  Substance Use Topics  . Smoking status: Never Smoker   . Smokeless tobacco: Never Used  . Alcohol Use: No   OB History    No data available      Review of Systems  Constitutional: Negative for fever and chills.  HENT: Positive for congestion and sore throat. Negative for trouble swallowing.   Respiratory: Positive for cough.   Gastrointestinal: Positive for  nausea and vomiting. Negative for abdominal pain.  All other systems reviewed and are negative.     Allergies  Abilify; Seroquel; Amoxicillin-pot clavulanate; and Keppra  Home Medications   Prior to Admission medications   Medication Sig Start Date End Date Taking? Authorizing Provider  albuterol (PROVENTIL) (2.5 MG/3ML) 0.083% nebulizer solution Take 3 mLs (2.5 mg total) by nebulization every 4 (four) hours as needed for wheezing or shortness of breath (wheezing). 03/09/15  Yes Roselyn Kara Mead, MD  budesonide (PULMICORT) 0.5 MG/2ML nebulizer solution One vial twice daily 03/15/15  Yes Nyoka Cowden, MD  cetirizine (ZYRTEC) 10 MG tablet take 1 tablet by mouth once daily 03/01/15  Yes Roselyn Kara Mead, MD  cloNIDine (CATAPRES) 0.1 MG tablet Take 1 tab by mouth at bedtime. Patient taking differently: Take 0.15 mg by mouth at bedtime. Take 1 tab by mouth at bedtime. 11/16/14  Yes Deetta Perla, MD  Cranberry 500 MG CAPS Take 500 mg by mouth daily.   Yes Historical Provider, MD  DEPAKOTE 500 MG DR tablet Take 1 tablet twice per day Patient taking differently: Take 500 mg by mouth 2 (two) times daily. Take 1 tablet twice per day 03/01/15  Yes Elveria Rising, NP  famotidine (PEPCID) 20 MG tablet One at bedtime Patient taking differently: Take 20 mg  by mouth at bedtime. One at bedtime 03/15/15  Yes Nyoka Cowden, MD  fluconazole (DIFLUCAN) 100 MG tablet take 1 tablet by mouth once daily as needed for yeast infection 02/15/15  Yes Jessica U Vann, DO  fluticasone (FLONASE) 50 MCG/ACT nasal spray Place 2 sprays into both nostrils daily.   Yes Historical Provider, MD  hyoscyamine (LEVSIN SL) 0.125 MG SL tablet DISSOLVE 1 TABLET UNDER THE TONGUE 2 TIMES A DAY 08/08/14  Yes Historical Provider, MD  Levonorgestrel-Ethinyl Estradiol (SEASONIQUE) 0.15-0.03 &0.01 MG tablet Take 1 tablet by mouth daily. Take 1 active tablet daily, skip non active pills. Patient taking differently: Take 1 tablet by  mouth at bedtime. Take 1 active tablet daily, skip non active pills. 10/27/13  Yes Antionette Char, MD  Melatonin 3 MG TABS Take 3 mg by mouth at bedtime. For insomnia   Yes Historical Provider, MD  montelukast (SINGULAIR) 10 MG tablet Take 1 tablet (10 mg total) by mouth at bedtime. 12/20/14  Yes Roselyn Kara Mead, MD  Multiple Vitamin (MULTIVITAMIN WITH MINERALS) TABS tablet Take 1 tablet by mouth daily.   Yes Historical Provider, MD  nitrofurantoin, macrocrystal-monohydrate, (MACROBID) 100 MG capsule Take 1 capsule (100 mg total) by mouth 2 (two) times daily. Patient taking differently: Take 100 mg by mouth 2 (two) times daily as needed.  11/21/14  Yes Hayden Rasmussen, NP  nystatin-triamcinolone (MYCOLOG II) cream Apply 1 application topically daily. Reported on 03/08/2015 02/10/15  Yes Historical Provider, MD  Olopatadine HCl 0.2 % SOLN Place 1 drop into both eyes daily as needed (allergies). Reported on 03/08/2015   Yes Historical Provider, MD  Olopatadine HCl 0.6 % SOLN Use one spray twice daily for stuffy nose or drainage. 03/08/15  Yes Roselyn Kara Mead, MD  ondansetron (ZOFRAN ODT) 8 MG disintegrating tablet Take 1 tablet (8 mg total) by mouth every 8 (eight) hours as needed for nausea or vomiting. 02/15/15  Yes Jessica U Vann, DO  pantoprazole (PROTONIX) 40 MG tablet Take 1 tablet (40 mg total) by mouth daily. Take 30-60 min before first meal of the day 03/15/15  Yes Nyoka Cowden, MD  promethazine-codeine [facility] WITH CODEINE) 6.25-10 MG/5ML syrup Take 5 mLs by mouth every 4 (four) hours as needed for cough. 03/15/15  Yes Nyoka Cowden, MD  senna (SENOKOT) 8.6 MG tablet Take 1 tablet by mouth daily.   Yes Historical Provider, MD  traZODone (DESYREL) 50 MG tablet Take 2 tabs by mouth at bedtime Patient taking differently: Take 100 mg by mouth at bedtime. Take 2 tabs by mouth at bedtime 03/01/15  Yes Elveria Rising, NP  trimethoprim (TRIMPEX) 100 MG tablet Take 100 mg by mouth every morning.     Yes Historical Provider, MD  vitamin C (ASCORBIC ACID) 500 MG tablet Take 500 mg by mouth daily.   Yes Historical Provider, MD  predniSONE (DELTASONE) 20 MG tablet Take 2 tablets (40 mg total) by mouth daily. 03/17/15   Mady Gemma, PA-C  senna (SENOKOT) 8.6 MG TABS tablet Take 1 tablet (8.6 mg total) by mouth daily. 09/08/13 11/02/14  Jon Gills, MD    BP 116/52 mmHg  Pulse 89  Temp(Src) 97.8 F (36.6 C) (Oral)  Resp 18  Ht 4\' 9"  (1.448 m)  Wt 81.647 kg  BMI 38.94 kg/m2  SpO2 98% Physical Exam  Constitutional: She is oriented to person, place, and time. She appears well-developed and well-nourished. No distress.  HENT:  Head: Normocephalic and atraumatic.  Right Ear: External  ear normal.  Left Ear: External ear normal.  Nose: Nose normal.  Mouth/Throat: Uvula is midline, oropharynx is clear and moist and mucous membranes are normal.  Eyes: Conjunctivae, EOM and lids are normal. Pupils are equal, round, and reactive to light. Right eye exhibits no discharge. Left eye exhibits no discharge. No scleral icterus.  Neck: Normal range of motion. Neck supple.  Cardiovascular: Normal rate, regular rhythm, normal heart sounds, intact distal pulses and normal pulses.   Pulmonary/Chest: Effort normal. No respiratory distress. She has wheezes. She has no rales.  Mild wheezing to upper lung fields bilaterally.  Abdominal: Soft. Normal appearance and bowel sounds are normal. She exhibits no distension and no mass. There is no tenderness. There is no rigidity, no rebound and no guarding.  Musculoskeletal: Normal range of motion. She exhibits no edema or tenderness.  Neurological: She is alert and oriented to person, place, and time. She has normal strength. No cranial nerve deficit or sensory deficit.  Skin: Skin is warm, dry and intact. No rash noted. She is not diaphoretic. No erythema. No pallor.  Psychiatric: She has a normal mood and affect. Her speech is normal and behavior is  normal.  Nursing note and vitals reviewed.   ED Course  Procedures (including critical care time)  Labs Review Labs Reviewed  RAPID STREP SCREEN (NOT AT [facility])  CULTURE, GROUP A STREP    Imaging Review Dg Chest 2 View  03/17/2015  CLINICAL DATA:  Cough with wheezing and sinus drainage, recent pneumonia in November. History of asthma. EXAM: CHEST  2 VIEW COMPARISON:  Chest x-rays dated 02/26/2015 and 02/13/2015. FINDINGS: Study is hypoinspiratory with crowding of the perihilar and bibasilar bronchovascular markings. Given the low lung volumes, lungs appear clear. No evidence of pneumonia on today's exam. No pleural effusion. No pneumothorax. Osseous and soft tissue structures about the chest are unremarkable. IMPRESSION: Hypoinspiratory exam with low lung volumes. No evidence of acute cardiopulmonary abnormality. Electronically Signed   By: Bary Richard M.D.   On: 03/17/2015 13:06   I have personally reviewed and evaluated these images and lab results as part of my medical decision-making.   EKG Interpretation None      MDM   Final diagnoses:  URI (upper respiratory infection)  Wheezing    19 year old female presents with nasal congestion, sore throat, cough productive of green sputum, and wheezing since Tuesday. Denies fever, chills. Reports intermittent vomiting with eating.   Of note, patient was diagnosed with pneumonia on November 28. She was discharged and subsequently returned to the ED on the 29th due to persistent symptoms. She was admitted overnight for observation and was then discharged. She was seen again in the emergency department on December 11 for the same symptoms and was sent home with instructions to follow-up with her pulmonologist.  Patient is afebrile. Heart regular rate and rhythm. Mild wheezing to upper lung fields bilaterally. Abdomen soft, nontender, nondistended. No lower extremity edema.  Rapid strep negative. Chest x-ray no evidence of acute  cardiopulmonary abnormality. Patient given breathing treatment in the ED. Wheezing resolved on repeat exam.  Patient is nontoxic and well-appearing, feel she is stable for discharge at this time. Mildly tachycardic, likely due to albuterol. No vomiting in the ED, patient able to tolerate PO intake. Will treat with short steroid course. Advised mom to try OTC cough and cold medications for additional symptom relief and to increase fluid intake. Patient to follow up with PCP next week for further evaluation and management. Return  precautions discussed. Patient's mom verbalizes her understanding and is in agreement with plan.  BP 101/67 mmHg  Pulse 117  Temp(Src) 97.8 F (36.6 C) (Oral)  Resp 18  Ht 4\' 9"  (1.448 m)  Wt 81.647 kg  BMI 38.94 kg/m2  SpO2 98%     Mady Gemmalizabeth C Westfall, PA-C 03/17/15 1441  Laurence Spatesachel Morgan Little, MD 03/18/15 [phone]

## 2015-03-18 ENCOUNTER — Encounter (HOSPITAL_COMMUNITY): Payer: Self-pay | Admitting: Emergency Medicine

## 2015-03-18 ENCOUNTER — Emergency Department (HOSPITAL_COMMUNITY)
Admission: EM | Admit: 2015-03-18 | Discharge: 2015-03-18 | Disposition: A | Discharge status: Home / Self Care | Payer: Medicare Other | Attending: Emergency Medicine | Admitting: Emergency Medicine

## 2015-03-18 ENCOUNTER — Emergency Department (HOSPITAL_COMMUNITY): Payer: Medicare Other

## 2015-03-18 DIAGNOSIS — F840 Autistic disorder: Secondary | ICD-10-CM | POA: Insufficient documentation

## 2015-03-18 DIAGNOSIS — Z880 Allergy status to penicillin: Secondary | ICD-10-CM | POA: Insufficient documentation

## 2015-03-18 DIAGNOSIS — Z8669 Personal history of other diseases of the nervous system and sense organs: Secondary | ICD-10-CM | POA: Diagnosis not present

## 2015-03-18 DIAGNOSIS — K5900 Constipation, unspecified: Secondary | ICD-10-CM | POA: Insufficient documentation

## 2015-03-18 DIAGNOSIS — R112 Nausea with vomiting, unspecified: Secondary | ICD-10-CM | POA: Insufficient documentation

## 2015-03-18 DIAGNOSIS — R000 Tachycardia, unspecified: Secondary | ICD-10-CM | POA: Diagnosis not present

## 2015-03-18 DIAGNOSIS — Z79899 Other long term (current) drug therapy: Secondary | ICD-10-CM | POA: Diagnosis not present

## 2015-03-18 DIAGNOSIS — E860 Dehydration: Secondary | ICD-10-CM | POA: Diagnosis not present

## 2015-03-18 DIAGNOSIS — Z7951 Long term (current) use of inhaled steroids: Secondary | ICD-10-CM | POA: Insufficient documentation

## 2015-03-18 DIAGNOSIS — Z3202 Encounter for pregnancy test, result negative: Secondary | ICD-10-CM | POA: Diagnosis not present

## 2015-03-18 DIAGNOSIS — Z87448 Personal history of other diseases of urinary system: Secondary | ICD-10-CM | POA: Diagnosis not present

## 2015-03-18 DIAGNOSIS — J45901 Unspecified asthma with (acute) exacerbation: Secondary | ICD-10-CM | POA: Diagnosis not present

## 2015-03-18 LAB — COMPREHENSIVE METABOLIC PANEL
ALK PHOS: 44 U/L (ref 38–126)
ALT: 19 U/L (ref 14–54)
AST: 27 U/L (ref 15–41)
Albumin: 2.9 g/dL — ABNORMAL LOW (ref 3.5–5.0)
Anion gap: 12 (ref 5–15)
BILIRUBIN TOTAL: 0.3 mg/dL (ref 0.3–1.2)
BUN: 11 mg/dL (ref 6–20)
CALCIUM: 9.1 mg/dL (ref 8.9–10.3)
CO2: 23 mmol/L (ref 22–32)
CREATININE: 0.9 mg/dL (ref 0.44–1.00)
Chloride: 104 mmol/L (ref 101–111)
Glucose, Bld: 219 mg/dL — ABNORMAL HIGH (ref 65–99)
Potassium: 4.4 mmol/L (ref 3.5–5.1)
Sodium: 139 mmol/L (ref 135–145)
Total Protein: 6.1 g/dL — ABNORMAL LOW (ref 6.5–8.1)

## 2015-03-18 LAB — CBC WITH DIFFERENTIAL/PLATELET
Basophils Absolute: 0 10*3/uL (ref 0.0–0.1)
Basophils Relative: 0 %
EOS PCT: 0 %
Eosinophils Absolute: 0 10*3/uL (ref 0.0–0.7)
HEMATOCRIT: 37.7 % (ref 36.0–46.0)
HEMOGLOBIN: 12.1 g/dL (ref 12.0–15.0)
LYMPHS ABS: 1.5 10*3/uL (ref 0.7–4.0)
LYMPHS PCT: 19 %
MCH: 29.4 pg (ref 26.0–34.0)
MCHC: 32.1 g/dL (ref 30.0–36.0)
MCV: 91.5 fL (ref 78.0–100.0)
Monocytes Absolute: 0.6 10*3/uL (ref 0.1–1.0)
Monocytes Relative: 7 %
Neutro Abs: 5.9 10*3/uL (ref 1.7–7.7)
Neutrophils Relative %: 74 %
PLATELETS: 334 10*3/uL (ref 150–400)
RBC: 4.12 MIL/uL (ref 3.87–5.11)
RDW: 14.5 % (ref 11.5–15.5)
WBC: 8 10*3/uL (ref 4.0–10.5)

## 2015-03-18 LAB — LIPASE, BLOOD: LIPASE: 19 U/L (ref 11–51)

## 2015-03-18 LAB — URINALYSIS, ROUTINE W REFLEX MICROSCOPIC
BILIRUBIN URINE: NEGATIVE
Ketones, ur: 15 mg/dL — AB
Leukocytes, UA: NEGATIVE
Nitrite: NEGATIVE
PROTEIN: NEGATIVE mg/dL
Specific Gravity, Urine: 1.039 — ABNORMAL HIGH (ref 1.005–1.030)
pH: 6 (ref 5.0–8.0)

## 2015-03-18 LAB — I-STAT CG4 LACTIC ACID, ED
LACTIC ACID, VENOUS: 1.88 mmol/L (ref 0.5–2.0)
Lactic Acid, Venous: 3.22 mmol/L (ref 0.5–2.0)

## 2015-03-18 LAB — URINE MICROSCOPIC-ADD ON
BACTERIA UA: NONE SEEN
WBC UA: NONE SEEN WBC/hpf (ref 0–5)

## 2015-03-18 LAB — PREGNANCY, URINE: PREG TEST UR: NEGATIVE

## 2015-03-18 MED ORDER — AZITHROMYCIN 250 MG PO TABS: ORAL_TABLET | ORAL | Status: DC

## 2015-03-18 MED ORDER — ALBUTEROL SULFATE (2.5 MG/3ML) 0.083% IN NEBU
INHALATION_SOLUTION | RESPIRATORY_TRACT | Status: AC
  Filled 2015-03-18: qty 6

## 2015-03-18 MED ORDER — SODIUM CHLORIDE 0.9 % IV BOLUS (SEPSIS)
1000.0000 mL | Freq: Once | INTRAVENOUS | Status: AC
  Administered 2015-03-18: 1000 mL via INTRAVENOUS

## 2015-03-18 MED ORDER — ONDANSETRON HCL 4 MG/2ML IJ SOLN
4.0000 mg | Freq: Once | INTRAMUSCULAR | Status: AC
  Administered 2015-03-18: 4 mg via INTRAVENOUS
  Filled 2015-03-18: qty 2

## 2015-03-18 MED ORDER — ALBUTEROL SULFATE (2.5 MG/3ML) 0.083% IN NEBU
5.0000 mg | INHALATION_SOLUTION | Freq: Once | RESPIRATORY_TRACT | Status: AC
  Administered 2015-03-18: 5 mg via RESPIRATORY_TRACT

## 2015-03-18 MED ORDER — ONDANSETRON HCL 4 MG PO TABS: 4.0000 mg | ORAL_TABLET | Freq: Three times a day (TID) | ORAL | Status: DC | PRN

## 2015-03-18 MED ORDER — IPRATROPIUM-ALBUTEROL 0.5-2.5 (3) MG/3ML IN SOLN
3.0000 mL | Freq: Once | RESPIRATORY_TRACT | Status: AC
  Administered 2015-03-18: 3 mL via RESPIRATORY_TRACT
  Filled 2015-03-18: qty 3

## 2015-03-18 MED ORDER — SODIUM CHLORIDE 0.9 % IV BOLUS (SEPSIS)
2000.0000 mL | Freq: Once | INTRAVENOUS | Status: AC
  Administered 2015-03-18: 2000 mL via INTRAVENOUS

## 2015-03-18 NOTE — ED Notes (Signed)
[name]/caregive verbalized understanding of d/c instructions and has no further questions. Pt stable and NAD.

## 2015-03-18 NOTE — ED Notes (Signed)
Patient transported to X-ray 

## 2015-03-18 NOTE — Discharge Instructions (Signed)
Push fluids: take small frequent sips of water or Gatorade, do not drink any soda, juice or caffeinated beverages.    Slowly resume solid diet as desired. Avoid food that are spicy, contain dairy and/or have high fat content.  Aviod NSAIDs (aspirin, motrin, ibuprofen, naproxen, Aleve et Karie Sodacetera) for pain control because they will irritate your stomach.  Please follow with your primary care doctor in the next 2 days for a check-up. They must obtain records for further management.   Do not hesitate to return to the Emergency Department for any new, worsening or concerning symptoms.    Dehydration, Adult Dehydration means your body does not have as much fluid or water as it needs. It happens when you take in less fluid than you lose. Your kidneys, brain, and heart will not work properly without the right amount of fluids.  Dehydration can range from mild to severe. It should be treated right away to help prevent it from becoming severe. HOME CARE  Drink enough fluid to keep your pee (urine) clear or pale yellow.  Drink water or fluid slowly by taking small sips. You can also try sucking on ice cubes.  Have food or drinks that contain electrolytes. Examples include bananas and sports drinks.  Take over-the-counter and prescription medicines only as told by your doctor.  Prepare oral rehydration solution (ORS) according to the instructions that came with it. Take sips of ORS every 5 minutes until your pee returns to normal.  If you are throwing up (vomiting) or have watery poop (diarrhea), keep trying to drink water, ORS, or both.  If you have watery poop, avoid:  Drinks with caffeine.  Fruit juice.  Milk.  Carbonated soft drinks.  Do not take salt tablets. This can lead to having too much sodium in your body (hypernatremia). GET HELP IF:  You cannot eat or drink without throwing up.  You have had mild watery poop for longer than 24 hours.  You have a fever. GET HELP RIGHT  AWAY IF:   You have very strong thirst.  You have very bad watery poop.  You have not peed in 6-8 hours, or you have peed only a small amount of very dark pee.  You have shriveled skin.  You are dizzy, confused, or both.   This information is not intended to replace advice given to you by your health care provider. Make sure you discuss any questions you have with your health care provider.   Document Released: 12/29/2008 Document Revised: 11/23/2014 Document Reviewed: 07/20/2014 Elsevier Interactive Patient Education [employer]2016 [employer]

## 2015-03-18 NOTE — ED Notes (Signed)
Pt. reports persistent wheezing with productive cough this evening , seen here today received medications , chest x-ray done with discharged prescription.

## 2015-03-18 NOTE — ED Provider Notes (Signed)
CSN: [id number]     Arrival date & time 03/18/15  0145 History   First MD Initiated Contact with Patient 03/18/15 0225     Chief Complaint  Patient presents with  . Asthma     (Consider location/radiation/quality/duration/timing/severity/associated sxs/prior Treatment) HPI   Blood pressure 115/58, pulse 114, temperature 97.6 F (36.4 C), temperature source Oral, SpO2 98 %.  Lori Miles is a 19 y.o. female with past medical history significant for cerebral palsy, asthma, seizure disorder brought in by parents complaining of rhinorrhea, cough, or throat, wheezing, shortness of breath, multiple episodes of emesis today. Patient was seen for asthma exacerbation this a.m., had negative chest x-ray and was written a prescription for prednisone, mother is adamant that this patient needs antibiotics. Reports tactile fevers yesterday, now resolved. Denies diarrhea. On review of systems mother notes rigors, denies seizure like activity. Level V caveat.   Past Medical History  Diagnosis Date  . CP (cerebral palsy) (HCC)     mom denies  . Asthma   . Mental deficiency     hx of aggressive behavior,impaired speech   . Constipation   . Allergy   . Autism     mom states no actual dx, but admits she has some symptoms  . Frequency-urgency syndrome      some incontinence ; wears pull-ups  . Seizure (HCC)     most recent sz " at the end of summer"  . Interstitial cystitis   . Bronchitis    Past Surgical History  Procedure Laterality Date  . Cystoscopy    . Cystoscopy with hydrodistension and biopsy  04/14/2012    Procedure: CYSTOSCOPY/BIOPSY/HYDRODISTENSION;  Surgeon: Lindaann Slough, MD;  Location: [facility];  Service: Urology;;  instillation of marcaine and pyridium   Family History  Problem Relation Age of Onset  . Seizures Mother   . Seizures Sister     1 sister has   Social History  Substance Use Topics  . Smoking status: Never Smoker   . Smokeless tobacco:  Never Used  . Alcohol Use: No   OB History    No data available     Review of Systems  10 systems reviewed and found to be negative, except as noted in the HPI.   Allergies  Abilify; Seroquel; Amoxicillin-pot clavulanate; and Keppra  Home Medications   Prior to Admission medications   Medication Sig Start Date End Date Taking? Authorizing Provider  albuterol (PROVENTIL) (2.5 MG/3ML) 0.083% nebulizer solution Take 3 mLs (2.5 mg total) by nebulization every 4 (four) hours as needed for wheezing or shortness of breath (wheezing). 03/09/15  Yes Roselyn Kara Mead, MD  budesonide (PULMICORT) 0.5 MG/2ML nebulizer solution One vial twice daily 03/15/15  Yes Nyoka Cowden, MD  cetirizine (ZYRTEC) 10 MG tablet take 1 tablet by mouth once daily 03/01/15  Yes Roselyn Kara Mead, MD  cloNIDine (CATAPRES) 0.1 MG tablet Take 1 tab by mouth at bedtime. Patient taking differently: Take 0.15 mg by mouth at bedtime. Take 1 tab by mouth at bedtime. 11/16/14  Yes Deetta Perla, MD  Cranberry 500 MG CAPS Take 500 mg by mouth daily.   Yes Historical Provider, MD  DEPAKOTE 500 MG DR tablet Take 1 tablet twice per day Patient taking differently: Take 500 mg by mouth 2 (two) times daily. Take 1 tablet twice per day 03/01/15  Yes Elveria Rising, NP  famotidine (PEPCID) 20 MG tablet One at bedtime Patient taking differently: Take 20 mg by mouth at  bedtime. One at bedtime 03/15/15  Yes Nyoka Cowden, MD  fluticasone [facility]) 50 MCG/ACT nasal spray Place 2 sprays into both nostrils daily.   Yes Historical Provider, MD  hyoscyamine (LEVSIN SL) 0.125 MG SL tablet DISSOLVE 1 TABLET UNDER THE TONGUE 2 TIMES A DAY 08/08/14  Yes Historical Provider, MD  Levonorgestrel-Ethinyl Estradiol (SEASONIQUE) 0.15-0.03 &0.01 MG tablet Take 1 tablet by mouth daily. Take 1 active tablet daily, skip non active pills. Patient taking differently: Take 1 tablet by mouth at bedtime. Take 1 active tablet daily, skip non active pills.  10/27/13  Yes Antionette Char, MD  Melatonin 3 MG TABS Take 3 mg by mouth at bedtime. For insomnia   Yes Historical Provider, MD  montelukast (SINGULAIR) 10 MG tablet Take 1 tablet (10 mg total) by mouth at bedtime. 12/20/14  Yes Roselyn Kara Mead, MD  Multiple Vitamin (MULTIVITAMIN WITH MINERALS) TABS tablet Take 1 tablet by mouth daily.   Yes Historical Provider, MD  nystatin-triamcinolone (MYCOLOG II) cream Apply 1 application topically daily. Reported on 03/08/2015 02/10/15  Yes Historical Provider, MD  Olopatadine HCl 0.2 % SOLN Place 1 drop into both eyes daily as needed (allergies). Reported on 03/08/2015   Yes Historical Provider, MD  Olopatadine HCl 0.6 % SOLN Use one spray twice daily for stuffy nose or drainage. 03/08/15  Yes Roselyn Kara Mead, MD  ondansetron (ZOFRAN ODT) 8 MG disintegrating tablet Take 1 tablet (8 mg total) by mouth every 8 (eight) hours as needed for nausea or vomiting. 02/15/15  Yes Jessica U Vann, DO  pantoprazole (PROTONIX) 40 MG tablet Take 1 tablet (40 mg total) by mouth daily. Take 30-60 min before first meal of the day 03/15/15  Yes Nyoka Cowden, MD  predniSONE (DELTASONE) 20 MG tablet Take 2 tablets (40 mg total) by mouth daily. 03/17/15  Yes Mady Gemma, PA-C  promethazine-codeine (PHENERGAN WITH CODEINE) 6.25-10 MG/5ML syrup Take 5 mLs by mouth every 4 (four) hours as needed for cough. 03/15/15  Yes Nyoka Cowden, MD  senna (SENOKOT) 8.6 MG tablet Take 1 tablet by mouth daily.   Yes Historical Provider, MD  traZODone (DESYREL) 50 MG tablet Take 2 tabs by mouth at bedtime Patient taking differently: Take 100 mg by mouth at bedtime. Take 2 tabs by mouth at bedtime 03/01/15  Yes Elveria Rising, NP  trimethoprim (TRIMPEX) 100 MG tablet Take 100 mg by mouth every morning.    Yes Historical Provider, MD  vitamin C (ASCORBIC ACID) 500 MG tablet Take 500 mg by mouth daily.   Yes Historical Provider, MD  azithromycin (ZITHROMAX Z-PAK) 250 MG tablet 2 po day  one, then 1 daily x 4 days 03/18/15   Joni Reining Avante Carneiro, PA-C  fluconazole (DIFLUCAN) 100 MG tablet take 1 tablet by mouth once daily as needed for yeast infection Patient not taking: Reported on 03/18/2015 02/15/15   Joseph Art, DO  nitrofurantoin, macrocrystal-monohydrate, (MACROBID) 100 MG capsule Take 1 capsule (100 mg total) by mouth 2 (two) times daily. Patient not taking: Reported on 03/18/2015 11/21/14   Hayden Rasmussen, NP  ondansetron (ZOFRAN) 4 MG tablet Take 1 tablet (4 mg total) by mouth every 8 (eight) hours as needed for nausea or vomiting. 03/18/15   Wynetta Emery, PA-C  senna (SENOKOT) 8.6 MG TABS tablet Take 1 tablet (8.6 mg total) by mouth daily. Patient not taking: Reported on 03/18/2015 09/08/13 11/02/14  Jon Gills, MD   BP 114/80 mmHg  Pulse 97  Temp(Src) 97.6 F (36.4  C) (Oral)  Resp 18  SpO2 99% Physical Exam  Constitutional: She appears well-developed and well-nourished. No distress.  HENT:  Head: Normocephalic.  Mouth/Throat: Oropharynx is clear and moist.  Eyes: Conjunctivae and EOM are normal. Pupils are equal, round, and reactive to light.  Neck: Normal range of motion.  Cardiovascular: Regular rhythm and intact distal pulses.   Mildly tachycardic  Pulmonary/Chest: Effort normal and breath sounds normal. No stridor. No respiratory distress. She has no wheezes. She has no rales. She exhibits no tenderness.  Abdominal: Soft. She exhibits no distension and no mass. There is no tenderness. There is no rebound and no guarding.  Musculoskeletal: Normal range of motion.  Neurological: She is alert.  Psychiatric: She has a normal mood and affect.  Nursing note and vitals reviewed.   ED Course  Procedures (including critical care time) Labs Review Labs Reviewed  COMPREHENSIVE METABOLIC PANEL - Abnormal; Notable for the following:    Glucose, Bld 219 (*)    Total Protein 6.1 (*)    Albumin 2.9 (*)    All other components within normal limits  URINALYSIS,  ROUTINE W REFLEX MICROSCOPIC (NOT AT [facility]) - Abnormal; Notable for the following:    Specific Gravity, Urine 1.039 (*)    Glucose, UA >1000 (*)    Hgb urine dipstick TRACE (*)    Ketones, ur 15 (*)    All other components within normal limits  URINE MICROSCOPIC-ADD ON - Abnormal; Notable for the following:    Squamous Epithelial / LPF 0-5 (*)    All other components within normal limits  I-STAT CG4 LACTIC ACID, ED - Abnormal; Notable for the following:    Lactic Acid, Venous 3.22 (*)    All other components within normal limits  CULTURE, BLOOD (ROUTINE X 2)  CULTURE, BLOOD (ROUTINE X 2)  CBC WITH DIFFERENTIAL/PLATELET  LIPASE, BLOOD  PREGNANCY, URINE  I-STAT CG4 LACTIC ACID, ED    Imaging Review Dg Chest 2 View  03/18/2015  CLINICAL DATA:  19 year old female with cough and wheezing EXAM: CHEST  2 VIEW COMPARISON:  Radiograph dated 03/17/2015 FINDINGS: Two views of the chest demonstrate mild bibasilar atelectatic changes and interstitial crowding. No focal consolidation, pleural effusion, or pneumothorax. Cardiac silhouette is within normal limits. The osseous structures appear unremarkable. IMPRESSION: No focal consolidation. Electronically Signed   By: Elgie CollardArash  Radparvar M.D.   On: 03/18/2015 03:14   Dg Chest 2 View  03/17/2015  CLINICAL DATA:  Cough with wheezing and sinus drainage, recent pneumonia in November. History of asthma. EXAM: CHEST  2 VIEW COMPARISON:  Chest x-rays dated 02/26/2015 and 02/13/2015. FINDINGS: Study is hypoinspiratory with crowding of the perihilar and bibasilar bronchovascular markings. Given the low lung volumes, lungs appear clear. No evidence of pneumonia on today's exam. No pleural effusion. No pneumothorax. Osseous and soft tissue structures about the chest are unremarkable. IMPRESSION: Hypoinspiratory exam with low lung volumes. No evidence of acute cardiopulmonary abnormality. Electronically Signed   By: Bary RichardStan  Maynard M.D.   On: 03/17/2015 13:06   I have  personally reviewed and evaluated these images and lab results as part of my medical decision-making.   EKG Interpretation None      MDM   Final diagnoses:  Non-intractable vomiting with nausea, vomiting of unspecified type  Dehydration    Filed Vitals:   03/18/15 0430 03/18/15 0445 03/18/15 0515 03/18/15 0545  BP: 116/75 96/68 104/75 114/80  Pulse: 111 109 98 97  Temp:      TempSrc:  Resp:   18   SpO2: 93% 94% 96% 99%    Medications  albuterol (PROVENTIL) (2.5 MG/3ML) 0.083% nebulizer solution ( Nebulization Canceled Entry 03/18/15 0326)  albuterol (PROVENTIL) (2.5 MG/3ML) 0.083% nebulizer solution 5 mg (5 mg Nebulization Given 03/18/15 0157)  sodium chloride 0.9 % bolus 1,000 mL (0 mLs Intravenous Stopped 03/18/15 0352)  ondansetron (ZOFRAN) injection 4 mg (4 mg Intravenous Given 03/18/15 0255)  sodium chloride 0.9 % bolus 2,000 mL (0 mLs Intravenous Stopped 03/18/15 0339)  ipratropium-albuterol (DUONEB) 0.5-2.5 (3) MG/3ML nebulizer solution 3 mL (3 mLs Nebulization Given 03/18/15 0550)    Lori Miles is 19 y.o. female presenting with multiple episodes of emesis, Rigors, persistent wheezing and rhinorrhea. Sounds clear to auscultation on my exam, she is mildly tachycardic. Abdominal exam is benign. Given tachycardia and emesis, will give fluids, blood work, chest x-ray.  Chest x-rays without infiltrate. Blood work reassuring except for an elevated lactic acid of 3.4. I think this is likely secondary to dehydration from the emesis. Repeat abdominal exam is benign, lung sounds remained clear to auscultation. Patient has been given 3 L normal saline, lactic acid has normalized.  Antibiotics given at mother's request.  Evaluation does not show pathology that would require ongoing emergent intervention or inpatient treatment. Pt is hemodynamically stable and mentating appropriately. Discussed findings and plan with patient/guardian, who agrees with care plan. All questions  answered. Return precautions discussed and outpatient follow up given.   New Prescriptions   AZITHROMYCIN (ZITHROMAX Z-PAK) 250 MG TABLET    2 po day one, then 1 daily x 4 days   ONDANSETRON (ZOFRAN) 4 MG TABLET    Take 1 tablet (4 mg total) by mouth every 8 (eight) hours as needed for nausea or vomiting.         Wynetta Emery, PA-C 03/18/15 0272  Blane Ohara, MD 03/18/15 [phone]

## 2015-03-19 LAB — CULTURE, GROUP A STREP: STREP A CULTURE: NEGATIVE

## 2015-03-22 ENCOUNTER — Telehealth: Payer: Self-pay | Admitting: Internal Medicine

## 2015-03-22 ENCOUNTER — Emergency Department (HOSPITAL_COMMUNITY): Payer: Medicare Other

## 2015-03-22 ENCOUNTER — Emergency Department (HOSPITAL_COMMUNITY)
Admission: EM | Admit: 2015-03-22 | Discharge: 2015-03-22 | Disposition: A | Discharge status: Home / Self Care | Payer: Medicare Other | Attending: Emergency Medicine | Admitting: Emergency Medicine

## 2015-03-22 ENCOUNTER — Ambulatory Visit (INDEPENDENT_AMBULATORY_CARE_PROVIDER_SITE_OTHER): Payer: Medicare Other | Admitting: Pediatrics

## 2015-03-22 ENCOUNTER — Encounter: Payer: Self-pay | Admitting: Pediatrics

## 2015-03-22 ENCOUNTER — Telehealth: Payer: Self-pay | Admitting: Allergy and Immunology

## 2015-03-22 ENCOUNTER — Encounter (HOSPITAL_COMMUNITY): Payer: Self-pay | Admitting: Emergency Medicine

## 2015-03-22 VITALS — BP 102/68 | HR 104 | Ht 58.25 in | Wt 178.2 lb

## 2015-03-22 DIAGNOSIS — Z792 Long term (current) use of antibiotics: Secondary | ICD-10-CM | POA: Diagnosis not present

## 2015-03-22 DIAGNOSIS — F840 Autistic disorder: Secondary | ICD-10-CM | POA: Diagnosis not present

## 2015-03-22 DIAGNOSIS — R269 Unspecified abnormalities of gait and mobility: Secondary | ICD-10-CM | POA: Diagnosis not present

## 2015-03-22 DIAGNOSIS — Z7951 Long term (current) use of inhaled steroids: Secondary | ICD-10-CM | POA: Diagnosis not present

## 2015-03-22 DIAGNOSIS — Z87448 Personal history of other diseases of urinary system: Secondary | ICD-10-CM | POA: Insufficient documentation

## 2015-03-22 DIAGNOSIS — J45901 Unspecified asthma with (acute) exacerbation: Secondary | ICD-10-CM | POA: Diagnosis not present

## 2015-03-22 DIAGNOSIS — F71 Moderate intellectual disabilities: Secondary | ICD-10-CM | POA: Diagnosis not present

## 2015-03-22 DIAGNOSIS — R1110 Vomiting, unspecified: Secondary | ICD-10-CM | POA: Diagnosis not present

## 2015-03-22 DIAGNOSIS — R05 Cough: Secondary | ICD-10-CM | POA: Diagnosis not present

## 2015-03-22 DIAGNOSIS — Z793 Long term (current) use of hormonal contraceptives: Secondary | ICD-10-CM | POA: Diagnosis not present

## 2015-03-22 DIAGNOSIS — F919 Conduct disorder, unspecified: Secondary | ICD-10-CM | POA: Diagnosis not present

## 2015-03-22 DIAGNOSIS — Z79899 Other long term (current) drug therapy: Secondary | ICD-10-CM | POA: Insufficient documentation

## 2015-03-22 DIAGNOSIS — E669 Obesity, unspecified: Secondary | ICD-10-CM | POA: Diagnosis not present

## 2015-03-22 DIAGNOSIS — Z880 Allergy status to penicillin: Secondary | ICD-10-CM | POA: Diagnosis not present

## 2015-03-22 DIAGNOSIS — Z8719 Personal history of other diseases of the digestive system: Secondary | ICD-10-CM | POA: Insufficient documentation

## 2015-03-22 DIAGNOSIS — Z8669 Personal history of other diseases of the nervous system and sense organs: Secondary | ICD-10-CM | POA: Insufficient documentation

## 2015-03-22 LAB — CBC WITH DIFFERENTIAL/PLATELET
BASOS PCT: 1 %
Basophils Absolute: 0.2 10*3/uL — ABNORMAL HIGH (ref 0.0–0.1)
EOS PCT: 0 %
Eosinophils Absolute: 0 10*3/uL (ref 0.0–0.7)
HEMATOCRIT: 38.8 % (ref 36.0–46.0)
Hemoglobin: 12.4 g/dL (ref 12.0–15.0)
Lymphocytes Relative: 24 %
Lymphs Abs: 4.7 10*3/uL — ABNORMAL HIGH (ref 0.7–4.0)
MCH: 30 pg (ref 26.0–34.0)
MCHC: 32 g/dL (ref 30.0–36.0)
MCV: 93.7 fL (ref 78.0–100.0)
MONO ABS: 2 10*3/uL — AB (ref 0.1–1.0)
Monocytes Relative: 10 %
NEUTROS PCT: 65 %
Neutro Abs: 12.6 10*3/uL — ABNORMAL HIGH (ref 1.7–7.7)
Platelets: 429 10*3/uL — ABNORMAL HIGH (ref 150–400)
RBC: 4.14 MIL/uL (ref 3.87–5.11)
RDW: 14.4 % (ref 11.5–15.5)
WBC: 19.5 10*3/uL — ABNORMAL HIGH (ref 4.0–10.5)

## 2015-03-22 LAB — I-STAT CHEM 8, ED
BUN: 19 mg/dL (ref 6–20)
CHLORIDE: 101 mmol/L (ref 101–111)
CREATININE: 0.8 mg/dL (ref 0.44–1.00)
Calcium, Ion: 1.14 mmol/L (ref 1.12–1.23)
GLUCOSE: 181 mg/dL — AB (ref 65–99)
HCT: 41 % (ref 36.0–46.0)
Hemoglobin: 13.9 g/dL (ref 12.0–15.0)
POTASSIUM: 3.9 mmol/L (ref 3.5–5.1)
Sodium: 140 mmol/L (ref 135–145)
TCO2: 28 mmol/L (ref 0–100)

## 2015-03-22 MED ORDER — PREDNISONE 20 MG PO TABS: ORAL_TABLET | ORAL | Status: DC

## 2015-03-22 MED ORDER — ALBUTEROL SULFATE (2.5 MG/3ML) 0.083% IN NEBU
5.0000 mg | INHALATION_SOLUTION | Freq: Once | RESPIRATORY_TRACT | Status: AC
  Administered 2015-03-22: 5 mg via RESPIRATORY_TRACT
  Filled 2015-03-22: qty 6

## 2015-03-22 NOTE — Telephone Encounter (Signed)
lmtcb

## 2015-03-22 NOTE — Progress Notes (Signed)
Patient: Lori Miles MRN: [id number] Sex: female DOB: [birth year]  Provider: Deetta Perla, MD Location of Care: [facility]  Note type: Routine return visit  History of Present Illness: Referral Source: Fleet Contras, MD History from: mother and sibling, patient and [facility] chart Chief Complaint: Epilepsy  Lori Miles is a 20 y.o. female who was evaluated March 22, 2015, for the first time since November 14, 2014.  She was here today for face-to-face evaluation to obtain a wheelchair.  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  When out in the community, she is unable to walk for more than 100 feet without becoming fatigued complaining of pain in her legs and often collapsing.  This significantly limits her ability to be out in the community in stores, malls, and entertainment areas such as the [place].  Though she is able to move independently about home, even there after she has been on her feet for some time or if she gets on uneven surface, she will fall.  In an attempt to increase her opportunity to be in the community and to increase safety at home, I recommend a folding tilt-in- space wheelchair.  This will address her organic gait disorder.  She also has a problem with disruptive behavior and at times will break away from her mother if she becomes frustrated.  If she is securely fastened with a wheelchair, it will be difficult for this to happen in public settings, which are the situations that are most challenging for her.  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  She is unable to sense danger, a wheelchair also will help prevent an accident in a parking lot if she breaks away from her mother.  This wheelchair needs to be portable so that it can be placed in the car.  In all likelihood this may be the greatest utility of the chair; outside the home.  Her mother will push Lori Miles because she lacks coordination to push herself.  She has been evaluated by  Kerry Fort a physical therapist at [facility].  I reviewed her evaluation performed on October 20, 2014, and agreed with the therapist's findings.  I was able to get Via to cooperate somewhat more today for physical examination, which is noted below.  Lori Miles is unable to independently dress herself.  She can feed herself as long as the food has been prepared.  She drinks from a closed cup.  She is dependent on others for hygiene after elimination.  She is also dependent on others for hygiene to bath at home.  She will not be well served with a cane or a walker.  She does not have the cognitive ability to understand how those might stabilize her balance.  She is impulsive and would likely let go of the cane or walker.  These devices would not address the issues of pain in her legs and easy fatigability that cause her to suddenly give-way and fall.  Lori Miles is obese and it is difficult for her to carry her weight for distances more than a 100 feet.  Review of Systems: 12 system review was unremarkable  Past Medical History Diagnosis Date  . CP (cerebral palsy) (HCC)     mom denies  . Asthma   . Mental deficiency     hx of aggressive behavior,impaired speech   . Constipation   . Allergy   . Autism     mom states no actual dx, but admits she has some symptoms  .  Frequency-urgency syndrome      some incontinence ; wears pull-ups  . Seizure (HCC)     most recent sz " at the end of summer"  . Interstitial cystitis   . Bronchitis    Hospitalizations: Yes.  , Head Injury: No., Nervous System Infections: No., Immunizations up to date: Yes.    EEG on September 14, 2011, was normal. She was able to be successfully weaned off Keppra. The patient has significant issues with aggression, which was thought to be related to Keppra, but has persisted once it was discontinued. The patient was treated with Depakote both for behavior and possible seizures. She had  palpitations and severe constipation on Seroquel. She had marked weight gain on Abilify and Depakote. When generic Divalproex was tried she had breakthrough seizures.   Nocturnal polysomnogram on October 27, 2011, failed to show significant periodic limb movements, cyanosis, sleep apnea, or cardiac arrhythmia. EEG on February 20, 2012, was a normal study with the patient awake.  Behavior History sensory seeking behavior  Surgical History Procedure Laterality Date  . Cystoscopy    . Cystoscopy with hydrodistension and biopsy  04/14/2012    Procedure: CYSTOSCOPY/BIOPSY/HYDRODISTENSION;  Surgeon: Lindaann Slough, MD;  Location: [facility];  Service: Urology;;  instillation of marcaine and pyridium   Family History family history includes Seizures in her mother and sister. Family history is negative for migraines, intellectual disabilities, blindness, deafness, birth defects, chromosomal disorder, or autism.  Social History . Marital Status: Single    Spouse Name: N/A  . Number of Children: N/A  . Years of Education: N/A   Social History Main Topics  . Smoking status: Never Smoker   . Smokeless tobacco: Never Used  . Alcohol Use: No  . Drug Use: No  . Sexual Activity: No   Social History Narrative    Lives with mom Research scientist (physical sciences)) and half-sister Ramisa Duman) who is also mentally impaired.  Has CAP assistance, urinary incontinence, needs help with feeding,dressing, toileting    Goes to [employer], 12th grade, will be graduating this summer.  She enjoys playing with cars, dancing, and dogs.   Allergies Allergen Reactions  . Abilify [Aripiprazole] Swelling and Palpitations  . Seroquel [Quetiapine Fumarate] Palpitations  . Amoxicillin-Pot Clavulanate Hives  . Keppra [Levetiracetam] Other (See Comments)    Aggressive behavior    Physical Exam BP 102/68 mmHg  Pulse 104  Ht 4' 10.25" (1.48 m)  Wt 178 lb 3.2 oz (80.831 kg)  BMI 36.90  kg/m2  General: alert, well developed, obese with short stature, in no acute distress, black hair, brown eyes, right handed  Head: normocephalic, no dysmorphic features  Ears, Nose and Throat: Otoscopic: Tympanic membranes normal. Pharynx: oropharynx is pink without exudates or tonsillar hypertrophy.  Neck: supple, full range of motion, no cranial or cervical bruits  Respiratory: auscultation clear  Cardiovascular: no murmurs, pulses are normal  Musculoskeletal: no skeletal deformities or apparent scoliosis  Skin: no rashes or neurocutaneous lesions  Neurologic Exam  Mental Status: alert; she is able to follow some commands. She is able to name objects. She is impulsive.  Cranial Nerves: visual fields are full to double simultaneous stimuli; extraocular movements are full and conjugate; pupils are round reactive to light; funduscopic examination shows sharp disc margins with normal vessels; symmetric facial strength; midline tongue and uvula; air conduction is greater than bone conduction bilaterally.  Motor: Normal strength, tone, and mass; good fine motor movements; no pronator drift.  Sensory: intact responses  to touch and temperature Coordination: good finger-to-nose, rapid repetitive alternating movements and finger apposition  Gait and Station: Gait is broad-based slightly unsteady; balance is poor; Romberg exam is negative; Gower response is negative but difficult Reflexes: symmetric and diminished bilaterally; no clonus; bilateral flexor plantar responses.  Assessment 1. Gait disorder, R26.9. 2. 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 3. Intellectual disability, moderate, F71. 4. Obesity (BMI 30-39.9), E66.9.  Discussion In my opinion the folding tilt-in-space wheelchair is medically necessary and meets the needs for safe ambulation in the community and at home for this patient.  It also provides restraint for safety when she moves from a car into a parking lot or to  an open space where her impulsivity might lead her to break physical contact with her caregivers.  The chair has number of features that are customized that will enhance her comfort and safety.  I agree with all recommendations that have been made.  She will need this for lifetime.  I do not expect that she will improve to a point where assisted ambulation with a wheelchair is not necessary.  She will return to see me in six months' time.  I spent 30 minutes of face-to-face time with [facility] and her mother, more than half of it in consultation.   Medication List   This list is accurate as of: 03/22/15  6:23 PM.       albuterol (2.5 MG/3ML) 0.083% nebulizer solution  Commonly known as:  PROVENTIL  Take 3 mLs (2.5 mg total) by nebulization every 4 (four) hours as needed for wheezing or shortness of breath (wheezing).     azithromycin 250 MG tablet  Commonly known as:  ZITHROMAX Z-PAK  2 po day one, then 1 daily x 4 days     cetirizine 10 MG tablet  Commonly known as:  ZYRTEC  take 1 tablet by mouth once daily     cloNIDine 0.1 MG tablet  Commonly known as:  CATAPRES  Take 1 tab by mouth at bedtime.     Cranberry 500 MG Caps  Take 500 mg by mouth daily.     DEPAKOTE 500 MG DR tablet  Generic drug:  divalproex  Take 1 tablet twice per day     famotidine 20 MG tablet  Commonly known as:  PEPCID  One at bedtime     fluconazole 100 MG tablet  Commonly known as:  DIFLUCAN  take 1 tablet by mouth once daily as needed for yeast infection     hyoscyamine 0.125 MG SL tablet  Commonly known as:  LEVSIN SL  DISSOLVE 1 TABLET UNDER THE TONGUE 2 TIMES A DAY     Levonorgestrel-Ethinyl Estradiol 0.15-0.03 &0.01 MG tablet  Commonly known as:  SEASONIQUE  Take 1 tablet by mouth daily. Take 1 active tablet daily, skip non active pills.     Melatonin 3 MG Tabs  Take 3 mg by mouth at bedtime. For insomnia     montelukast 10 MG tablet  Commonly known as:  SINGULAIR  Take 1 tablet (10 mg  total) by mouth at bedtime.     multivitamin with minerals Tabs tablet  Take 1 tablet by mouth daily.     nitrofurantoin (macrocrystal-monohydrate) 100 MG capsule  Commonly known as:  MACROBID  Take 1 capsule (100 mg total) by mouth 2 (two) times daily.     nystatin-triamcinolone cream  Commonly known as:  MYCOLOG II  Apply 1 application topically daily. Reported on 03/08/2015     Olopatadine HCl  0.2 % Soln  Place 1 drop into both eyes daily as needed (allergies). Reported on 03/22/2015     Olopatadine HCl 0.6 % Soln  Use one spray twice daily for stuffy nose or drainage.     ondansetron 4 MG tablet  Commonly known as:  ZOFRAN  Take 1 tablet (4 mg total) by mouth every 8 (eight) hours as needed for nausea or vomiting.     ondansetron 8 MG disintegrating tablet  Commonly known as:  ZOFRAN ODT  Take 1 tablet (8 mg total) by mouth every 8 (eight) hours as needed for nausea or vomiting.     pantoprazole 40 MG tablet  Commonly known as:  PROTONIX  Take 1 tablet (40 mg total) by mouth daily. Take 30-60 min before first meal of the day     predniSONE 20 MG tablet  Commonly known as:  DELTASONE  Take 2 tablets (40 mg total) by mouth daily.     promethazine-codeine 6.25-10 MG/5ML syrup  Commonly known as:  PHENERGAN with CODEINE  Take 5 mLs by mouth every 4 (four) hours as needed for cough.     PULMICORT 0.5 MG/2ML nebulizer solution  Generic drug:  budesonide  Take 0.5 mg by nebulization 2 (two) times daily. One vial twice daily     senna 8.6 MG tablet  Commonly known as:  SENOKOT  Take 1 tablet by mouth daily.     senna 8.6 MG Tabs tablet  Commonly known as:  SENOKOT  Take 1 tablet (8.6 mg total) by mouth daily.     traZODone 50 MG tablet  Commonly known as:  DESYREL  Take 2 tabs by mouth at bedtime     trimethoprim 100 MG tablet  Commonly known as:  TRIMPEX  Take 100 mg by mouth every morning.     vitamin C 500 MG tablet  Commonly known as:  ASCORBIC ACID  Take  500 mg by mouth daily.      The medication list was reviewed and reconciled. All changes or newly prescribed medications were explained.  A complete medication list was provided to the patient/caregiver.  Deetta PerlaWilliam H Patt Steinhardt MD

## 2015-03-22 NOTE — ED Notes (Addendum)
Heart rate has decreased to 96, no active wheezing or abnormal lung sounds auscultated. Family insisting on a breathing treatment, stating she has a productive cough. No cough observed in triage.

## 2015-03-22 NOTE — ED Provider Notes (Addendum)
CSN: [id number]     Arrival date & time 03/22/15  1811 History  By signing my name below, I, Budd Palmer, attest that this documentation has been prepared under the direction and in the presence of Arman Filter, NP. Electronically Signed: Budd Palmer, ED Scribe. 03/22/2015. 11:22 PM.    Chief Complaint  Patient presents with  . Cough  . Emesis   The history is provided by a parent. No language interpreter was used.   HPI Comments: Lori Miles is a 20 y.o. female with a PMHx of Asthma, allergy, CP, autism, and mental deficiency brought in by mother to the Emergency Department complaining of chronic, productive (dark sputum) cough that has worsened within the past few days. Per mom, pt was given antibiotics at her last hospital visit for the same, and took her last dose today. She states pt has been taking her albuterol every 4 hours  and has an appointment with her allergist for tomorrow. She states pt is "not breathing right." She reports pt having associated vomiting as of today. She notes pt has been given Zofran without effect. She notes pt is coughing up thick, progressively darkening sputum. She states pt has been given nasal spray for congestion without effect. She notes pt was wheezing until receiving a breathing treatment in the ED today.     Past Medical History  Diagnosis Date  . CP (cerebral palsy) (HCC)     mom denies  . Asthma   . Mental deficiency     hx of aggressive behavior,impaired speech   . Constipation   . Allergy   . Autism     mom states no actual dx, but admits she has some symptoms  . Frequency-urgency syndrome      some incontinence ; wears pull-ups  . Seizure (HCC)     most recent sz " at the end of summer"  . Interstitial cystitis   . Bronchitis    Past Surgical History  Procedure Laterality Date  . Cystoscopy    . Cystoscopy with hydrodistension and biopsy  04/14/2012    Procedure: CYSTOSCOPY/BIOPSY/HYDRODISTENSION;  Surgeon: Lindaann Slough, MD;  Location: [facility];  Service: Urology;;  instillation of marcaine and pyridium   Family History  Problem Relation Age of Onset  . Seizures Mother   . Seizures Sister     1 sister has   Social History  Substance Use Topics  . Smoking status: Never Smoker   . Smokeless tobacco: Never Used  . Alcohol Use: No   OB History    No data available     Review of Systems  HENT: Positive for congestion.   Respiratory: Positive for cough and wheezing.   Gastrointestinal: Positive for vomiting.    Allergies  Abilify; Seroquel; Amoxicillin-pot clavulanate; and Keppra  Home Medications   Prior to Admission medications   Medication Sig Start Date End Date Taking? Authorizing Provider  albuterol (PROVENTIL) (2.5 MG/3ML) 0.083% nebulizer solution Take 3 mLs (2.5 mg total) by nebulization every 4 (four) hours as needed for wheezing or shortness of breath (wheezing). 03/09/15  Yes Roselyn Kara Mead, MD  budesonide (PULMICORT) 0.5 MG/2ML nebulizer solution Take 0.5 mg by nebulization 2 (two) times daily. One vial twice daily 03/15/15  Yes Nyoka Cowden, MD  cetirizine (ZYRTEC) 10 MG tablet take 1 tablet by mouth once daily Patient taking differently: Take 10 mg by mouth once daily 03/01/15  Yes Roselyn Kara Mead, MD  cloNIDine (CATAPRES) 0.1 MG tablet  Take 1 tab by mouth at bedtime. Patient taking differently: Take 0.15 mg by mouth at bedtime. Take 1 tab by mouth at bedtime. 11/16/14  Yes Deetta PerlaWilliam H Hickling, MD  Cranberry 500 MG CAPS Take 500 mg by mouth daily.   Yes Historical Provider, MD  DEPAKOTE 500 MG DR tablet Take 1 tablet twice per day Patient taking differently: Take 500 mg by mouth 2 (two) times daily. Take 1 tablet twice per day 03/01/15  Yes Elveria Risingina Goodpasture, NP  famotidine (PEPCID) 20 MG tablet One at bedtime Patient taking differently: Take 20 mg by mouth at bedtime. One at bedtime 03/15/15  Yes Nyoka CowdenMichael B Wert, MD  fluconazole (DIFLUCAN) 100 MG tablet take  1 tablet by mouth once daily as needed for yeast infection 02/15/15  Yes Joseph ArtJessica U Vann, DO  hyoscyamine (LEVSIN SL) 0.125 MG SL tablet DISSOLVE 1 TABLET UNDER THE TONGUE 2 TIMES A DAY 08/08/14  Yes Historical Provider, MD  Levonorgestrel-Ethinyl Estradiol (SEASONIQUE) 0.15-0.03 &0.01 MG tablet Take 1 tablet by mouth daily. Take 1 active tablet daily, skip non active pills. Patient taking differently: Take 1 tablet by mouth at bedtime. Take 1 active tablet daily, skip non active pills. 10/27/13  Yes Antionette CharLisa Jackson-Moore, MD  Melatonin 3 MG TABS Take 3 mg by mouth at bedtime. For insomnia   Yes Historical Provider, MD  montelukast (SINGULAIR) 10 MG tablet Take 1 tablet (10 mg total) by mouth at bedtime. 12/20/14  Yes Roselyn Kara MeadM Hicks, MD  Multiple Vitamin (MULTIVITAMIN WITH MINERALS) TABS tablet Take 1 tablet by mouth daily.   Yes Historical Provider, MD  nystatin-triamcinolone (MYCOLOG II) cream Apply 1 application topically daily. Reported on 03/08/2015 02/10/15  Yes Historical Provider, MD  Olopatadine HCl 0.6 % SOLN Use one spray twice daily for stuffy nose or drainage. 03/08/15  Yes Roselyn Kara MeadM Hicks, MD  ondansetron (ZOFRAN) 4 MG tablet Take 1 tablet (4 mg total) by mouth every 8 (eight) hours as needed for nausea or vomiting. 03/18/15  Yes Nicole Pisciotta, PA-C  pantoprazole (PROTONIX) 40 MG tablet Take 1 tablet (40 mg total) by mouth daily. Take 30-60 min before first meal of the day 03/15/15  Yes Nyoka CowdenMichael B Wert, MD  senna (SENOKOT) 8.6 MG tablet Take 1 tablet by mouth daily.   Yes Historical Provider, MD  traZODone (DESYREL) 50 MG tablet Take 2 tabs by mouth at bedtime Patient taking differently: Take 100 mg by mouth at bedtime. Take 2 tabs by mouth at bedtime 03/01/15  Yes Elveria Risingina Goodpasture, NP  trimethoprim (TRIMPEX) 100 MG tablet Take 100 mg by mouth every morning.    Yes Historical Provider, MD  vitamin C (ASCORBIC ACID) 500 MG tablet Take 500 mg by mouth daily.   Yes Historical Provider, MD   azithromycin (ZITHROMAX Z-PAK) 250 MG tablet 2 po day one, then 1 daily x 4 days Patient not taking: Reported on 03/22/2015 03/18/15   Joni ReiningNicole Pisciotta, PA-C  nitrofurantoin, macrocrystal-monohydrate, (MACROBID) 100 MG capsule Take 1 capsule (100 mg total) by mouth 2 (two) times daily. Patient not taking: Reported on 03/22/2015 11/21/14   Hayden Rasmussenavid Mabe, NP  ondansetron (ZOFRAN ODT) 8 MG disintegrating tablet Take 1 tablet (8 mg total) by mouth every 8 (eight) hours as needed for nausea or vomiting. Patient not taking: Reported on 03/22/2015 02/15/15   Joseph ArtJessica U Vann, DO  predniSONE (DELTASONE) 20 MG tablet 3 Tabs PO Days 1-3, then 2 tabs PO Days 4-6, then 1 tab PO Day 7-9, then Half Tab PO Day 10-12  03/22/15   Earley Favor, NP  promethazine-codeine (PHENERGAN WITH CODEINE) 6.25-10 MG/5ML syrup Take 5 mLs by mouth every 4 (four) hours as needed for cough. Patient not taking: Reported on 03/22/2015 03/15/15   Nyoka Cowden, MD   BP 112/65 mmHg  Pulse 77  Temp(Src) 98.3 F (36.8 C) (Oral)  Resp 20  SpO2 97% Physical Exam  Constitutional: She appears well-developed and well-nourished.  HENT:  Head: Normocephalic.  Eyes: Pupils are equal, round, and reactive to light.  Cardiovascular: Normal rate and regular rhythm.   Pulmonary/Chest: Effort normal. She has wheezes.  Abdominal: Soft.  Musculoskeletal: Normal range of motion.  Neurological: She is alert.  Skin: Skin is warm.  Vitals reviewed.   ED Course  Procedures  DIAGNOSTIC STUDIES: Oxygen Saturation is 97% on RA, adequate by my interpretation.    COORDINATION OF CARE: 10:08 PM - Discussed previous XR results and normal ED vitals. Discussed plans to order diagnostic studies. Parent advised of plan for treatment and parent agrees.  Labs Review Labs Reviewed  CBC WITH DIFFERENTIAL/PLATELET - Abnormal; Notable for the following:    WBC 19.5 (*)    Platelets 429 (*)    Neutro Abs 12.6 (*)    Lymphs Abs 4.7 (*)    Monocytes Absolute 2.0 (*)     Basophils Absolute 0.2 (*)    All other components within normal limits  I-STAT CHEM 8, ED - Abnormal; Notable for the following:    Glucose, Bld 181 (*)    All other components within normal limits    Imaging Review Dg Chest 2 View  03/22/2015  CLINICAL DATA:  Pneumonia and bronchitis. Productive cough. History of asthma. EXAM: CHEST  2 VIEW COMPARISON:  03/18/2015 FINDINGS: Normal cardiac silhouette. Airways normal. There is mild coarsening of the central bronchovascular markings. No focal infiltrate. No pleural fluid. No pneumothorax. IMPRESSION: Coarsened central bronchovascular markings suggests reactive airway disease or bronchiolitis. No evidence of pneumonia. Electronically Signed   By: Genevive Bi M.D.   On: 03/22/2015 20:57   I have personally reviewed and evaluated these images and lab results as part of my medical decision-making.   EKG Interpretation None     chest x-ray, again, is negative.  Patient has been observed in the emergency department for several hours.  She's had no difficulty breathing, no coughing episodes.  No vomiting.  Labs have been reviewed, although she does have an elevated white count.  She is on a steroid taper at this point, most likely resulting in elevated WBC with a essentially normal differential.  Electrolytes are normal.  She will be given an extended steroid taper.  She has an appointment in the morning with her allergist and she will be given a note for school  MDM   Final diagnoses:  Asthma exacerbation   I personally performed the services described in this documentation, which was scribed in my presence. The recorded information has been reviewed and is accurate.  Earley Favor, NP 03/22/15 1610  Lorre Nick, MD 03/23/15 1540  Earley Favor, NP 04/14/15 [id number]  Lorre Nick, MD 04/14/15 [phone]

## 2015-03-22 NOTE — ED Notes (Signed)
Patient was alert, oriented and stable upon discharge. RN went over AVS and family had no further questions.

## 2015-03-22 NOTE — Telephone Encounter (Signed)
We addressed coughing and wheezing in the instructions already and she should be following them with up to q4 h rx as listed   We did not address a runny nose but best way to stop this is For nasal drainage / throat tickle try take CHLORPHENIRAMINE  4 mg - take one every 4 hours as needed - available over the counter- may cause drowsiness so start with just a bedtime dose or two and see how you tolerate it before trying in daytime

## 2015-03-22 NOTE — Discharge Instructions (Signed)
Asthma, Adult Asthma is a condition of the lungs in which the airways tighten and narrow. Asthma can make it hard to breathe. Asthma cannot be cured, but medicine and lifestyle changes can help control it. Asthma may be started (triggered) by:  Animal skin flakes (dander).  Dust.  Cockroaches.  Pollen.  Mold.  Smoke.  Cleaning products.  Hair sprays or aerosol sprays.  Paint fumes or strong smells.  Cold air, weather changes, and winds.  Crying or laughing hard.  Stress.  Certain medicines or drugs.  Foods, such as dried fruit, potato chips, and sparkling grape juice.  Infections or conditions (colds, flu).  Exercise.  Certain medical conditions or diseases.  Exercise or tiring activities. HOME CARE   Take medicine as told by your doctor.  Use a peak flow meter as told by your doctor. A peak flow meter is a tool that measures how well the lungs are working.  Record and keep track of the peak flow meter's readings.  Understand and use the asthma action plan. An asthma action plan is a written plan for taking care of your asthma and treating your attacks.  To help prevent asthma attacks:  Do not smoke. Stay away from secondhand smoke.  Change your heating and air conditioning filter often.  Limit your use of fireplaces and wood stoves.  Get rid of pests (such as roaches and mice) and their droppings.  Throw away plants if you see mold on them.  Clean your floors. Dust regularly. Use cleaning products that do not smell.  Have someone vacuum when you are not home. Use a vacuum cleaner with a HEPA filter if possible.  Replace carpet with wood, tile, or vinyl flooring. Carpet can trap animal skin flakes and dust.  Use allergy-proof pillows, mattress covers, and box spring covers.  Wash bed sheets and blankets every week in hot water and dry them in a dryer.  Use blankets that are made of polyester or cotton.  Clean bathrooms and kitchens with bleach.  If possible, have someone repaint the walls in these rooms with mold-resistant paint. Keep out of the rooms that are being cleaned and painted.  Wash hands often. GET HELP IF:  You have make a whistling sound when breaking (wheeze), have shortness of breath, or have a cough even if taking medicine to prevent attacks.  The colored mucus you cough up (sputum) is thicker than usual.  The colored mucus you cough up changes from clear or white to yellow, green, gray, or bloody.  You have problems from the medicine you are taking such as:  A rash.  Itching.  Swelling.  Trouble breathing.  You need reliever medicines more than 2-3 times a week.  Your peak flow measurement is still at 50-79% of your personal best after following the action plan for 1 hour.  You have a fever. GET HELP RIGHT AWAY IF:   You seem to be worse and are not responding to medicine during an asthma attack.  You are short of breath even at rest.  You get short of breath when doing very little activity.  You have trouble eating, drinking, or talking.  You have chest pain.  You have a fast heartbeat.  Your lips or fingernails start to turn blue.  You are light-headed, dizzy, or faint.  Your peak flow is less than 50% of your personal best.   This information is not intended to replace advice given to you by your health care provider. Make sure   you discuss any questions you have with your health care provider.   Document Released: 08/21/2007 Document Revised: 11/23/2014 Document Reviewed: 10/01/2012 Elsevier Interactive Patient Education 2016 [employer] Inc.  

## 2015-03-22 NOTE — Telephone Encounter (Signed)
Spoke with the pt's mother  She states since the time her call today, she has scheduled pt with Dr Willa RoughHicks and may go to ER tonight  Pt is wheezing and has runny nose  He states this has been going on ever since her last visit here  She states that "none of those meds he gave her are helping" I advised for her to keep ov with Dr Willa RoughHicks for now, since we have no openings She should seek emergent care if she can not wait until then  Will forward to MW to make him aware  Below are last ov instructions:   Pantoprazole (protonix) 40 mg Take 30-60 min before first meal of the day and Pepcid (famotidine) 20 mg one @ bedtime until return to office - this is the best way to tell whether stomach acid is contributing to your problem.   GERD (REFLUX) is an extremely common cause of respiratory symptoms just like yours , many times with no obvious heartburn at all.   It can be treated with medication, but also with lifestyle changes including elevation of the head of your bed (ideally with 6 inch bed blocks), Smoking cessation, avoidance of late meals, excessive alcohol, and avoid fatty foods, chocolate, peppermint, colas, red wine, and acidic juices such as orange juice.  NO MINT OR MENTHOL PRODUCTS SO NO COUGH DROPS  USE SUGARLESS CANDY INSTEAD (Jolley ranchers or Stover's or Life Savers) or even ice chips will also do - the key is to swallow to prevent all throat clearing. NO OIL BASED VITAMINS - use powdered substitutes.  Reduce the pulmocort 0.5 twice daily   For cough > phenergan with codeine 2 tsp every 4 hours as needed   For wheezing > albuterol 2.5 mg every 4 hours as needed  F/u in 4 weeks, sooner if needed, with all medications in hand

## 2015-03-22 NOTE — ED Notes (Addendum)
Pt BIB mom.  States that she has had pna and bronchitis.  States she has not been able to shake it.  Still having productive cough and post tussive emesis.  Mom states that pt was "almost admitted the last time she was here".  Sees a pulmonologist for her asthma.  Lung sounds clear in triage.

## 2015-03-22 NOTE — Telephone Encounter (Signed)
Mom called in, Pt was admitted several days ago to ER for breathing issues and infection. She was given a z-pack, 20mg  of prednisone and a shot in er. She just finished prednisone and zpack yesterday. She made an apt for tomorrow at 430 with dr. Alver FisherHicks Mom might be taking her back to er bc pt is still having breathing and wheezing issues. She is using her albuterol and nasal spray, etc. Mom very worried. i let her know id get this info back to ER

## 2015-03-23 ENCOUNTER — Encounter: Payer: Self-pay | Admitting: Allergy and Immunology

## 2015-03-23 ENCOUNTER — Ambulatory Visit (INDEPENDENT_AMBULATORY_CARE_PROVIDER_SITE_OTHER): Payer: Medicare Other | Admitting: Allergy and Immunology

## 2015-03-23 ENCOUNTER — Ambulatory Visit: Payer: Medicare Other | Admitting: Allergy and Immunology

## 2015-03-23 VITALS — BP 118/74 | HR 84 | Temp 98.5°F | Resp 18

## 2015-03-23 DIAGNOSIS — J4530 Mild persistent asthma, uncomplicated: Secondary | ICD-10-CM | POA: Diagnosis not present

## 2015-03-23 LAB — CULTURE, BLOOD (ROUTINE X 2)
CULTURE: NO GROWTH
Culture: NO GROWTH

## 2015-03-23 MED ORDER — MOMETASONE FUROATE 50 MCG/ACT NA SUSP: NASAL | Status: DC

## 2015-03-23 NOTE — Telephone Encounter (Signed)
Spoke to mother who said [facility].

## 2015-03-23 NOTE — Patient Instructions (Addendum)
  Complete Prednisone as prescribed.  Decrease Pulmicort (Budesonide) to three times daily, then in 7 days decrease to twice daily  Continue other meds as previously.  Use saline nasal wash 2-4 times daily.  Use Nasonex 1 spray each nostril each morning.  (Stop Flonase)  Use Patanase 1 spray each evening.    If needed over the weekend may use new med ( 4mg  chlorphenramine 1/2-1 tablet) per Dr. Sherene SiresWert --potential for drowsiness  Follow-up in 3 weeks or sooner if needed.  Albuterol as needed.

## 2015-03-23 NOTE — Progress Notes (Signed)
FOLLOW UP NOTE  RE: Lori Miles Vanegas MRN: [id number] DOB: 11/09/1995 [facility] [address] [place] [id number] Date of Office Visit: 03/23/2015  Subjective:  Lori Miles Cutillo is a 20 y.o. female who presents today for Cough and Wheezing  Assessment:   1. Mild persistent asthma, improved control, completing prednisone taper.   2.      Allergic rhinoconjunctivitis with recent increasing nasal congestion, afebrile and in no respiratory distress. 3.      Improved lower respiratory symptoms with maternal concern greater then exam presentation. 4.      Complex medical history on multiple medication regime. 5.      Recent frequent ED visits. 6.      Suspected multifactorial association to cough. Plan:   Meds ordered this encounter  Medications  . mometasone (NASONEX) 50 MCG/ACT nasal spray    Sig: One-Two sprays each in each nostril    Dispense:  17 g    Refill:  3   Patient Instructions  1.  Complete Prednisone as prescribed. 2.  Decrease Pulmicort (Budesonide) to three times daily, then in 7 days decrease to twice daily. 3.  Continue other meds as previously. 4.  Use saline nasal wash 2-4 times daily. 5.  Use Nasonex 1 spray each nostril each morning.  (Stop Flonase) 6.  Use Patanase 1 spray each evening.   7.  If needed over the weekend may use new med ( 4mg  chlorphenramine 1/2-1 tablet) per Dr. Sherene SiresWert --potential for drowsiness 8.  Follow-up in 3 weeks or sooner if needed. 9.  Albuterol as needed and have reviewed with mom at length the importance of avoiding recurring prednisone other medication overuse in particular for upper respiratory symptoms. 10.  Review notes from Dr. Annalee GentaShoemaker for further upper respiratory management which are not available today.   HPI: Lori Miles returns to the office with mom reporting increasing congestion and noisy breathing.  Mom was not sure how consistently, she is improved with the recent visits,  including emergency department on 2 occasions.  The ED prescribed azithromycin given increasing phlegm discolored mucus and associated cough, now complete. And prednisone was added from the emergency department last night, which appeared to be more related to nasal congestion and not lower symptoms.  Mom describes trying to use Flonase as Dr. Annale[facility]Shoemaker had recommended but [facility].  She is tolerating the Patanase twice a day and occasionally, has added Ocean nasal and Triaminic.  She reported Dr. Sherene SiresWert had recommended a different antihistamine in the recent days, as well as managing for the possibility of reflux (Protonix and Pepcid though not started) related to any cough.  Mom feels reflux is not a part of Lori Miles symptoms.  Mom reports no recurring use of albuterol, but has maintained on increased Pulmicort.  Denies ED or urgent care visits, prednisone or antibiotic courses. Reports sleep and activity are normal.  Current Medications: 1.  Albuterol as needed. 2.  Pulmicort 0.5 mg 2-4 times daily. 3.  Patanase one spray once twice daily. 4.  Ocean saline wash as needed. 5.  Triaminic over-the-counter congestion as needed. 6.  Continues Depakote, cranberry caps, Levsin, Seasonique, trazodone, Trimpex, vitamin C, melatonin, Macrobid and multivitamin.  Drug Allergies: Allergies  Allergen Reactions  . Abilify [Aripiprazole] Swelling and Palpitations  . Seroquel [Quetiapine Fumarate] Palpitations  . Amoxicillin-Pot Clavulanate Hives  . Keppra [Levetiracetam] Other (See Comments)    Aggressive behavior     Objective:   Filed  Vitals:   03/23/15 1609  BP: 118/74  Pulse: 84  Temp: 98.5 F (36.9 C)  Resp: 18   Physical Exam  Constitutional: She is well-developed, well-nourished, and in no distress.  HENT:  Head: Atraumatic.  Right Ear: Tympanic membrane and ear canal normal.  Left Ear: Tympanic membrane and ear canal normal.  Nose: Mucosal edema (left greater  than right) and rhinorrhea (scant clear mucus) present. No epistaxis.  Mouth/Throat: Oropharynx is clear and moist and mucous membranes are normal. No oropharyngeal exudate, posterior oropharyngeal edema or posterior oropharyngeal erythema.  Neck: Neck supple.  Cardiovascular: Normal rate, S1 normal and S2 normal.   No murmur heard. Pulmonary/Chest: Effort normal. She has no wheezes. She has no rhonchi. She has no rales.  Lymphadenopathy:    She has no cervical adenopathy.   Diagnostics: Spirometry:  FVC  1.87--75%, FEV1 1.82--80%.    Davius Goudeau M. Willa Rough, MD  cc: Fleet Contras, MD

## 2015-03-23 NOTE — Telephone Encounter (Signed)
Spoke with pt mom, aware of rec's per MW. Will call back if not better in the next few days. Nothing further needed.

## 2015-03-27 ENCOUNTER — Telehealth: Payer: Self-pay | Admitting: Internal Medicine

## 2015-03-27 NOTE — Telephone Encounter (Signed)
Called and spoke with patient's mother. Informed her of MW's recs. She explained that Dr. Willa RoughHicks told her to call MW because he felt like it was a lung issue. I offered to make an ov with MW. She stated that she was unable to make ov at this time. She was eating supper and would need to get her calendar out in order to make ov but she would call our office tomorrow to make an ov. Patient's mother voiced understanding and had no further questions.

## 2015-03-27 NOTE — Telephone Encounter (Signed)
Can try taking the zyrtec 10 mg qam and the chlorpheniramine 4 mg one at bedtime as doesn't eat in her sleep and the undesired side effects should be gone by am when she does

## 2015-03-27 NOTE — Telephone Encounter (Signed)
Would not change rx without ov/ best bet is see Dr Willa RoughHicks as this is not a lung problem

## 2015-03-27 NOTE — Telephone Encounter (Signed)
Spoke with pt's mother, states that pt is having a reaction to chlorpheniramine: increased appetite.  Denies any other worsening s/s aside from this.   Pt's respiratory symptoms are improving but still present: PND, sinus congestion, runny nose.   Pt uses [facility] on [address].    MW please advise on further recs.  Thanks.   Pt was put on chlorpheniramine on 03/22/15 phone note.  Below are last ov instructions: Below are last ov instructions:   Pantoprazole (protonix) 40 mg Take 30-60 min before first meal of the day and Pepcid (famotidine) 20 mg one @ bedtime until return to office - this is the best way to tell whether stomach acid is contributing to your problem.   GERD (REFLUX) is an extremely common cause of respiratory symptoms just like yours , many times with no obvious heartburn at all.   It can be treated with medication, but also with lifestyle changes including elevation of the head of your bed (ideally with 6 inch bed blocks), Smoking cessation, avoidance of late meals, excessive alcohol, and avoid fatty foods, chocolate, peppermint, colas, red wine, and acidic juices such as orange juice.  NO MINT OR MENTHOL PRODUCTS SO NO COUGH DROPS  USE SUGARLESS CANDY INSTEAD (Jolley ranchers or Stover's or Life Savers) or even ice chips will also do - the key is to swallow to prevent all throat clearing. NO OIL BASED VITAMINS - use powdered substitutes.  Reduce the pulmocort 0.5 twice daily   For cough > phenergan with codeine 2 tsp every 4 hours as needed   For wheezing > albuterol 2.5 mg every 4 hours as needed  F/u in 4 weeks, sooner if needed, with all medications in hand

## 2015-03-27 NOTE — Telephone Encounter (Signed)
Spoke with pt's mother, states pt is already taking zyrtec qam and taking the chlorpheniramine qhs.  [name] is requesting that something else be called in to help clear up pt's sinus congestion.    MW please advise. Thanks.

## 2015-03-28 ENCOUNTER — Other Ambulatory Visit: Payer: Self-pay | Admitting: *Deleted

## 2015-03-28 ENCOUNTER — Telehealth: Payer: Self-pay | Admitting: Allergy and Immunology

## 2015-03-28 MED ORDER — ALBUTEROL SULFATE (2.5 MG/3ML) 0.083% IN NEBU: 2.5000 mg | INHALATION_SOLUTION | RESPIRATORY_TRACT | Status: DC | PRN

## 2015-03-28 MED ORDER — BUDESONIDE 0.5 MG/2ML IN SUSP: 0.5000 mg | Freq: Two times a day (BID) | RESPIRATORY_TRACT | Status: DC

## 2015-03-28 NOTE — Telephone Encounter (Signed)
Mom called and needs to talk with a nurse about the prednisone that Dr Willa RoughHicks gave her for Lori Miles. [phone].

## 2015-03-28 NOTE — Telephone Encounter (Signed)
Patient mom questions regarding Prednisone. Advised her to follow directions Dr Willa RoughHicks told her. Also refilled Pulmicort and Albuterol per mom request

## 2015-03-30 ENCOUNTER — Other Ambulatory Visit: Payer: Self-pay | Admitting: Neurology

## 2015-03-30 MED ORDER — CETIRIZINE HCL 10 MG PO TABS: 10.0000 mg | ORAL_TABLET | Freq: Every day | ORAL | Status: DC

## 2015-04-04 ENCOUNTER — Telehealth: Payer: Self-pay | Admitting: Internal Medicine

## 2015-04-04 ENCOUNTER — Encounter: Payer: Self-pay | Admitting: Pulmonary Disease

## 2015-04-04 ENCOUNTER — Ambulatory Visit (INDEPENDENT_AMBULATORY_CARE_PROVIDER_SITE_OTHER): Payer: Medicare Other | Admitting: Pulmonary Disease

## 2015-04-04 ENCOUNTER — Telehealth: Payer: Self-pay | Admitting: Pulmonary Disease

## 2015-04-04 VITALS — BP 112/80 | HR 118 | Temp 98.9°F | Ht <= 58 in | Wt 191.6 lb

## 2015-04-04 DIAGNOSIS — J45991 Cough variant asthma: Secondary | ICD-10-CM

## 2015-04-04 NOTE — Progress Notes (Signed)
Subjective:    Patient ID: Lori Miles, female    DOB: [birth year], 20 y.o.   MRN: [id number]  PROBLEM LIST: Mild persistent asthma Chronic cough Allergic rhinitis, sinusitis, postnasal drip GERD  HPI Lori Miles is here for followup of her asthma. She has history of Autism spectrum disorder/developmental delay, chronic interstitial cystitis, chronic constipation, seizure disorder. She had seen Dr. Sherene Sires in December for similar complaints. She's has recurrent attacks of bronchitis, wheezing, dyspnea, upper respiratory tract symptoms including sinusitis, nasal discharge, postnasal drip, cough. She's had an extensive evaluation including ENT examination by Dr. Annalee Genta and allergy testing by Dr. Willa Rough.   Her chief complaint today appears to be mainly upper respiratory tract/sinusitis. Mother states that she has copious nasal discharge at night, no subjective fevers but chills. No dyspnea, wheezing. She saw Dr. Willa Rough earlier this month and Flonase switched to Nasonex. She also continues on Patanase antihistamine nasal spray and acid suppression with Protonix and Pepcid.  DATA: PFTs 03/16/15 FVC 2.20 (80%) FEV1 2.10 [92%] F/F 95 No obstruction  PFTs 03/22/13 FEV1 1.87 [75%) FVC 1.82 [80%) F/F 97 Mild restriction suggested by reduction in FEV1/FVC  CXR 03/22/15 Coarsened central bronchovascular markings suggests reactive airway disease or bronchiolitis. No evidence of pneumonia.  Past Medical History  Diagnosis Date  . CP (cerebral palsy) (HCC)     mom denies  . Asthma   . Mental deficiency     hx of aggressive behavior,impaired speech   . Constipation   . Allergy   . Autism     mom states no actual dx, but admits she has some symptoms  . Frequency-urgency syndrome      some incontinence ; wears pull-ups  . Seizure (HCC)     most recent sz " at the end of summer"  . Interstitial cystitis   . Bronchitis      Current outpatient prescriptions:  .  albuterol (PROVENTIL)  (2.5 MG/3ML) 0.083% nebulizer solution, Take 3 mLs (2.5 mg total) by nebulization every 4 (four) hours as needed for wheezing or shortness of breath (wheezing)., Disp: 180 mL, Rfl: 1 .  azithromycin (ZITHROMAX Z-PAK) 250 MG tablet, 2 po day one, then 1 daily x 4 days, Disp: 5 tablet, Rfl: 0 .  budesonide (PULMICORT) 0.5 MG/2ML nebulizer solution, Take 2 mLs (0.5 mg total) by nebulization 2 (two) times daily. One vial twice daily, Disp: 180 mL, Rfl: 4 .  cetirizine (ZYRTEC) 10 MG tablet, Take 1 tablet (10 mg total) by mouth daily., Disp: 30 tablet, Rfl: 4 .  cloNIDine (CATAPRES) 0.1 MG tablet, Take 1 tab by mouth at bedtime. (Patient taking differently: Take 0.15 mg by mouth at bedtime. Take 1 tab by mouth at bedtime.), Disp: 31 tablet, Rfl: 5 .  Cranberry 500 MG CAPS, Take 500 mg by mouth daily., Disp: , Rfl:  .  DEPAKOTE 500 MG DR tablet, Take 1 tablet twice per day (Patient taking differently: Take 500 mg by mouth 2 (two) times daily. Take 1 tablet twice per day), Disp: 60 tablet, Rfl: 1 .  famotidine (PEPCID) 20 MG tablet, One at bedtime (Patient taking differently: Take 20 mg by mouth at bedtime. One at bedtime), Disp: 30 tablet, Rfl: 2 .  fluconazole (DIFLUCAN) 100 MG tablet, take 1 tablet by mouth once daily as needed for yeast infection, Disp: 2 tablet, Rfl: 4 .  hyoscyamine (LEVSIN SL) 0.125 MG SL tablet, DISSOLVE 1 TABLET UNDER THE TONGUE 2 TIMES A DAY, Disp: , Rfl: 0 .  Levonorgestrel-Ethinyl  Estradiol (SEASONIQUE) 0.15-0.03 &0.01 MG tablet, Take 1 tablet by mouth daily. Take 1 active tablet daily, skip non active pills. (Patient taking differently: Take 1 tablet by mouth at bedtime. Take 1 active tablet daily, skip non active pills.), Disp: 1 Package, Rfl: 11 .  Melatonin 3 MG TABS, Take 3 mg by mouth at bedtime. For insomnia, Disp: , Rfl:  .  mometasone (NASONEX) 50 MCG/ACT nasal spray, One-Two sprays each in each nostril, Disp: 17 g, Rfl: 3 .  montelukast (SINGULAIR) 10 MG tablet, Take 1  tablet (10 mg total) by mouth at bedtime., Disp: 30 tablet, Rfl: 3 .  Multiple Vitamin (MULTIVITAMIN WITH MINERALS) TABS tablet, Take 1 tablet by mouth daily., Disp: , Rfl:  .  nitrofurantoin, macrocrystal-monohydrate, (MACROBID) 100 MG capsule, Take 1 capsule (100 mg total) by mouth 2 (two) times daily., Disp: 10 capsule, Rfl: 0 .  nystatin-triamcinolone (MYCOLOG II) cream, Apply 1 application topically daily. Reported on 03/08/2015, Disp: , Rfl: 0 .  Olopatadine HCl 0.6 % SOLN, Use one spray twice daily for stuffy nose or drainage., Disp: 1 Bottle, Rfl: 5 .  ondansetron (ZOFRAN ODT) 8 MG disintegrating tablet, Take 1 tablet (8 mg total) by mouth every 8 (eight) hours as needed for nausea or vomiting., Disp: 10 tablet, Rfl: 0 .  ondansetron (ZOFRAN) 4 MG tablet, Take 1 tablet (4 mg total) by mouth every 8 (eight) hours as needed for nausea or vomiting., Disp: 10 tablet, Rfl: 0 .  pantoprazole (PROTONIX) 40 MG tablet, Take 1 tablet (40 mg total) by mouth daily. Take 30-60 min before first meal of the day, Disp: 30 tablet, Rfl: 2 .  predniSONE (DELTASONE) 20 MG tablet, 3 Tabs PO Days 1-3, then 2 tabs PO Days 4-6, then 1 tab PO Day 7-9, then Half Tab PO Day 10-12, Disp: 20 tablet, Rfl: 0 .  promethazine-codeine (PHENERGAN WITH CODEINE) 6.25-10 MG/5ML syrup, Take 5 mLs by mouth every 4 (four) hours as needed for cough., Disp: 473 mL, Rfl: 0 .  senna (SENOKOT) 8.6 MG tablet, Take 1 tablet by mouth daily., Disp: , Rfl:  .  traZODone (DESYREL) 50 MG tablet, Take 2 tabs by mouth at bedtime (Patient taking differently: Take 100 mg by mouth at bedtime. Take 2 tabs by mouth at bedtime), Disp: 62 tablet, Rfl: 1 .  trimethoprim (TRIMPEX) 100 MG tablet, Take 100 mg by mouth every morning. , Disp: , Rfl:  .  vitamin C (ASCORBIC ACID) 500 MG tablet, Take 500 mg by mouth daily., Disp: , Rfl:   Review of Systems Review of systems obtained from the mother. Nasal discharge, mucus, postnasal drip, cough. No wheezing,  chest pain, palpitation. No fevers, malaise, loss of weight, loss of appetite. + chills No nausea, vomiting, diarrhea, constipation. All other review of systems are negative    Objective:   Physical Exam Blood pressure 112/80, pulse 118, temperature 98.9 F (37.2 C), temperature source Oral, height 4' 9.5" (1.461 m), weight 191 lb 9.6 oz (86.909 kg), SpO2 98 %.  Gen: No apparent distress Neuro: No gross focal deficits. Ear, nose throat: Erythematous nasal mucus membranes, No JVD, lymphadenopathy, thyromegaly. RS: Clear, no wheeze, crackles CVS: S1-S2 heard, no murmurs rubs gallops. Abdomen: Soft, positive bowel sounds. Extremities: No edema.    Assessment & Plan:  Mild persistent asthma.  Her pulmonary symptoms appear to be well controlled on current inhalers including Proventil and budesonide nebs. Main symptoms appear to be upper respiratory. She has chronic allergic rhinitis, postnasal drip cough,  GERD. She is being actively treated with nasal sprays, double acid suppression medication. Her mother is requesting another course of antibiotic, however I told her that this is not a good idea as she does not appear to be infected. We will try to avoid prednisone as well.   Plan: - Continue nebs - Flonase, Patanase, Protonix, pepcid. - Try to obtain ENT notes.  RTC in 3 months.  Chilton Greathouse MD [facility] Pager [phone] If no answer or after 3pm call: [phone] 04/04/2015, 5:23 PM

## 2015-04-04 NOTE — Telephone Encounter (Signed)
[name] called to request antibiotic for nasal and chest congestion. Pt just seen by Dr Isaiah Serge earlier in day and his note indicates that antibiotics were not indicated. Mother is very frustrated. I indicated that I would pass this along to Dr Isaiah Serge and Dr Sherene Sires who can address this in the morning 04/05/15  Billy Fischer, MD PCCM service Mobile [phone] Pager [phone]

## 2015-04-04 NOTE — Telephone Encounter (Signed)
Opened note in error.  Closing.

## 2015-04-05 NOTE — Telephone Encounter (Signed)
Let her mother know I reviewed Dr Shirlee More and Willa Rough latest notes and agree with both of them that there is very little asthma here and most of her symptoms are nasal so needs f/u with ENT / shoemaker, not in our office.  If she does return to our office, she'll need to bring every active medication her daughter's  taking and if she fails to do so and it's not an emergency visit we will need to reschedule her for when she can provide this needed detail in her daughter's care to assure we're all on the same page. I strongly rec she do so with every doctor she sees.  No further phone in care for this matter (sinus draingage) thru this office.  Copy to Dr Willa Rough and Annalee Genta please

## 2015-04-05 NOTE — Telephone Encounter (Signed)
Spoke with pt's mother. She is aware of MW's recommendation. She declined an appointment with [name]. Nothing further was needed.

## 2015-04-08 ENCOUNTER — Emergency Department (HOSPITAL_COMMUNITY)
Admission: EM | Admit: 2015-04-08 | Discharge: 2015-04-08 | Disposition: A | Discharge status: Home / Self Care | Payer: Medicare Other | Attending: Emergency Medicine | Admitting: Emergency Medicine

## 2015-04-08 ENCOUNTER — Emergency Department (HOSPITAL_COMMUNITY): Payer: Medicare Other

## 2015-04-08 ENCOUNTER — Encounter (HOSPITAL_COMMUNITY): Payer: Self-pay | Admitting: Emergency Medicine

## 2015-04-08 DIAGNOSIS — R05 Cough: Secondary | ICD-10-CM | POA: Diagnosis not present

## 2015-04-08 DIAGNOSIS — Z79899 Other long term (current) drug therapy: Secondary | ICD-10-CM | POA: Insufficient documentation

## 2015-04-08 DIAGNOSIS — Z7952 Long term (current) use of systemic steroids: Secondary | ICD-10-CM | POA: Insufficient documentation

## 2015-04-08 DIAGNOSIS — Z880 Allergy status to penicillin: Secondary | ICD-10-CM | POA: Insufficient documentation

## 2015-04-08 DIAGNOSIS — J45909 Unspecified asthma, uncomplicated: Secondary | ICD-10-CM | POA: Diagnosis not present

## 2015-04-08 DIAGNOSIS — R1110 Vomiting, unspecified: Secondary | ICD-10-CM | POA: Diagnosis not present

## 2015-04-08 DIAGNOSIS — F840 Autistic disorder: Secondary | ICD-10-CM | POA: Diagnosis not present

## 2015-04-08 DIAGNOSIS — R059 Cough, unspecified: Secondary | ICD-10-CM

## 2015-04-08 LAB — CBC WITH DIFFERENTIAL/PLATELET
BASOS PCT: 0 %
Basophils Absolute: 0 10*3/uL (ref 0.0–0.1)
EOS ABS: 0 10*3/uL (ref 0.0–0.7)
Eosinophils Relative: 0 %
HEMATOCRIT: 33.9 % — AB (ref 36.0–46.0)
Hemoglobin: 11.4 g/dL — ABNORMAL LOW (ref 12.0–15.0)
LYMPHS ABS: 3.2 10*3/uL (ref 0.7–4.0)
Lymphocytes Relative: 28 %
MCH: 31 pg (ref 26.0–34.0)
MCHC: 33.6 g/dL (ref 30.0–36.0)
MCV: 92.1 fL (ref 78.0–100.0)
MONO ABS: 1.2 10*3/uL — AB (ref 0.1–1.0)
MONOS PCT: 10 %
NEUTROS ABS: 7 10*3/uL (ref 1.7–7.7)
Neutrophils Relative %: 62 %
PLATELETS: 194 10*3/uL (ref 150–400)
RBC: 3.68 MIL/uL — ABNORMAL LOW (ref 3.87–5.11)
RDW: 15.2 % (ref 11.5–15.5)
WBC: 11.4 10*3/uL — ABNORMAL HIGH (ref 4.0–10.5)

## 2015-04-08 LAB — COMPREHENSIVE METABOLIC PANEL
ALT: 13 U/L — ABNORMAL LOW (ref 14–54)
ANION GAP: 12 (ref 5–15)
AST: 18 U/L (ref 15–41)
Albumin: 2.6 g/dL — ABNORMAL LOW (ref 3.5–5.0)
Alkaline Phosphatase: 35 U/L — ABNORMAL LOW (ref 38–126)
BUN: 7 mg/dL (ref 6–20)
CHLORIDE: 105 mmol/L (ref 101–111)
CO2: 24 mmol/L (ref 22–32)
Calcium: 8.8 mg/dL — ABNORMAL LOW (ref 8.9–10.3)
Creatinine, Ser: 0.81 mg/dL (ref 0.44–1.00)
Glucose, Bld: 109 mg/dL — ABNORMAL HIGH (ref 65–99)
POTASSIUM: 4.1 mmol/L (ref 3.5–5.1)
Sodium: 141 mmol/L (ref 135–145)
TOTAL PROTEIN: 5.1 g/dL — AB (ref 6.5–8.1)
Total Bilirubin: 0.2 mg/dL — ABNORMAL LOW (ref 0.3–1.2)

## 2015-04-08 LAB — LIPASE, BLOOD: Lipase: 24 U/L (ref 11–51)

## 2015-04-08 LAB — I-STAT CG4 LACTIC ACID, ED: LACTIC ACID, VENOUS: 1.45 mmol/L (ref 0.5–2.0)

## 2015-04-08 MED ORDER — GI COCKTAIL ~~LOC~~
30.0000 mL | Freq: Once | ORAL | Status: AC
  Administered 2015-04-08: 30 mL via ORAL
  Filled 2015-04-08: qty 30

## 2015-04-08 MED ORDER — ONDANSETRON HCL 4 MG/2ML IJ SOLN
4.0000 mg | Freq: Once | INTRAMUSCULAR | Status: AC
  Administered 2015-04-08: 4 mg via INTRAVENOUS
  Filled 2015-04-08: qty 2

## 2015-04-08 MED ORDER — SODIUM CHLORIDE 0.9 % IV BOLUS (SEPSIS)
1000.0000 mL | Freq: Once | INTRAVENOUS | Status: AC
  Administered 2015-04-08: 1000 mL via INTRAVENOUS

## 2015-04-08 NOTE — ED Provider Notes (Signed)
CSN: [id number]     Arrival date & time 04/08/15  1048 History   First MD Initiated Contact with Patient 04/08/15 1118     Chief Complaint  Patient presents with  . Cough  . Gastroesophageal Reflux  . Emesis   HPI   Lori Miles is an 20 y.o. female with history of autism spectrum d/o, seizures, MR, asthma, chronic bronchitis who presents to the ED with her mother for evaluation of emesis, cough, and congestion. [name] reports that she has been following up regularly with both ENT and Pulm for ongoing respiratory issues as pt has chronic cough, congestion, and wheezing. Pt has apparently been cleared from an ENT standpoint and pt's mother states she is in the process of seeing a new pulmonologist as Lori Miles's current doctor "never does anything" about her cough and breathing issues. [name] states they come to the ED today because pt has reportedly been having multiple episodes of NBNB emesis throughout the night since last night, most recent episode 10 min PTA. [name] states that pt has recurrent issues with emesis for the past year and had previously seen GI but has not seen GI in years because her previous provider retired. Pt 's mother reports pt tends to have increased emesis laying down, improved with sitting up. [name] is concerned not only about pt's intermittent vomiting but also possible aspiration pneumonia as pt has chronic productive cough and sometimes it sounds like pt is choking and cannot breathe. Pt has apparently not been able to tolerate PO at all since last night. In the room during my exam, however, she has not had any episodes of dry heaving or vomiting. [name] states that pt has not had any diarrhea, has not complained of abdominal pain. States pt has been taking protonix and pepcid as prescribed by PCP as there was thought that her recurrent episodes of emesis and choking/wheezing were due to GERD, but pt's mother feels her symptoms are not improving.   Past  Medical History  Diagnosis Date  . CP (cerebral palsy) (HCC)     mom denies  . Asthma   . Mental deficiency     hx of aggressive behavior,impaired speech   . Constipation   . Allergy   . Autism     mom states no actual dx, but admits she has some symptoms  . Frequency-urgency syndrome      some incontinence ; wears pull-ups  . Seizure (HCC)     most recent sz " at the end of summer"  . Interstitial cystitis   . Bronchitis    Past Surgical History  Procedure Laterality Date  . Cystoscopy    . Cystoscopy with hydrodistension and biopsy  04/14/2012    Procedure: CYSTOSCOPY/BIOPSY/HYDRODISTENSION;  Surgeon: Lindaann Slough, MD;  Location: [facility];  Service: Urology;;  instillation of marcaine and pyridium   Family History  Problem Relation Age of Onset  . Seizures Mother   . Seizures Sister     1 sister has   Social History  Substance Use Topics  . Smoking status: Never Smoker   . Smokeless tobacco: Never Used  . Alcohol Use: No   OB History    No data available     Review of Systems  Unable to perform ROS: Patient nonverbal      Allergies  Abilify; Seroquel; Amoxicillin-pot clavulanate; and Keppra  Home Medications   Prior to Admission medications   Medication Sig Start Date End Date Taking?  Authorizing Provider  albuterol (PROVENTIL) (2.5 MG/3ML) 0.083% nebulizer solution Take 3 mLs (2.5 mg total) by nebulization every 4 (four) hours as needed for wheezing or shortness of breath (wheezing). 03/28/15  Yes Roselyn Kara Mead, MD  budesonide (PULMICORT) 0.5 MG/2ML nebulizer solution Take 2 mLs (0.5 mg total) by nebulization 2 (two) times daily. One vial twice daily 03/28/15  Yes Roselyn Kara Mead, MD  cetirizine (ZYRTEC) 10 MG tablet Take 1 tablet (10 mg total) by mouth daily. 03/30/15  Yes Roselyn Kara Mead, MD  cloNIDine (CATAPRES) 0.1 MG tablet Take 1 tab by mouth at bedtime. Patient taking differently: Take 0.15 mg by mouth at bedtime. Take 1 tab by  mouth at bedtime. 11/16/14  Yes Deetta Perla, MD  Cranberry 500 MG CAPS Take 500 mg by mouth daily.   Yes Historical Provider, MD  DEPAKOTE 500 MG DR tablet Take 1 tablet twice per day Patient taking differently: Take 500 mg by mouth 2 (two) times daily. Take 1 tablet twice per day 03/01/15  Yes Elveria Rising, NP  famotidine (PEPCID) 20 MG tablet One at bedtime Patient taking differently: Take 20 mg by mouth at bedtime. One at bedtime 03/15/15  Yes Nyoka Cowden, MD  fluconazole (DIFLUCAN) 100 MG tablet take 1 tablet by mouth once daily as needed for yeast infection 02/15/15  Yes Joseph Art, DO  hyoscyamine (LEVSIN SL) 0.125 MG SL tablet DISSOLVE 1 TABLET UNDER THE TONGUE 2 TIMES A DAY 08/08/14  Yes Historical Provider, MD  Levonorgestrel-Ethinyl Estradiol (SEASONIQUE) 0.15-0.03 &0.01 MG tablet Take 1 tablet by mouth daily. Take 1 active tablet daily, skip non active pills. 10/27/13  Yes Antionette Char, MD  Melatonin 3 MG TABS Take 3 mg by mouth at bedtime. For insomnia   Yes Historical Provider, MD  mometasone (NASONEX) 50 MCG/ACT nasal spray One-Two sprays each in each nostril 03/23/15  Yes Roselyn Kara Mead, MD  montelukast (SINGULAIR) 10 MG tablet Take 1 tablet (10 mg total) by mouth at bedtime. Patient taking differently: Take 10 mg by mouth daily. Takes at 1500 12/20/14  Yes Roselyn Kara Mead, MD  Multiple Vitamin (MULTIVITAMIN WITH MINERALS) TABS tablet Take 1 tablet by mouth daily.   Yes Historical Provider, MD  nitrofurantoin, macrocrystal-monohydrate, (MACROBID) 100 MG capsule Take 1 capsule (100 mg total) by mouth 2 (two) times daily. Patient taking differently: Take 100 mg by mouth 2 (two) times daily as needed.  11/21/14  Yes Hayden Rasmussen, NP  nystatin-triamcinolone (MYCOLOG II) cream Apply 1 application topically daily as needed. Reported on 03/08/2015 02/10/15  Yes Historical Provider, MD  Olopatadine HCl 0.6 % SOLN Use one spray twice daily for stuffy nose or drainage. 03/08/15   Yes Roselyn Kara Mead, MD  ondansetron (ZOFRAN ODT) 8 MG disintegrating tablet Take 1 tablet (8 mg total) by mouth every 8 (eight) hours as needed for nausea or vomiting. 02/15/15  Yes Joseph Art, DO  ondansetron (ZOFRAN) 4 MG tablet Take 1 tablet (4 mg total) by mouth every 8 (eight) hours as needed for nausea or vomiting. 03/18/15  Yes Nicole Pisciotta, PA-C  pantoprazole (PROTONIX) 40 MG tablet Take 1 tablet (40 mg total) by mouth daily. Take 30-60 min before first meal of the day 03/15/15  Yes Nyoka Cowden, MD  promethazine-codeine [facility] WITH CODEINE) 6.25-10 MG/5ML syrup Take 5 mLs by mouth every 4 (four) hours as needed for cough. 03/15/15  Yes Nyoka Cowden, MD  senna (SENOKOT) 8.6 MG tablet Take 1 tablet by  mouth daily.   Yes Historical Provider, MD  traZODone (DESYREL) 50 MG tablet Take 2 tabs by mouth at bedtime Patient taking differently: Take 100 mg by mouth at bedtime. Take 2 tabs by mouth at bedtime 03/01/15  Yes Elveria Rising, NP  trimethoprim (TRIMPEX) 100 MG tablet Take 100 mg by mouth every morning.    Yes Historical Provider, MD  vitamin C (ASCORBIC ACID) 500 MG tablet Take 500 mg by mouth daily.   Yes Historical Provider, MD   BP 98/67 mmHg  Pulse 106  Temp(Src) 98.6 F (37 C)  Resp 20  SpO2 100% Physical Exam  Constitutional: No distress.  HENT:  Right Ear: External ear normal.  Left Ear: External ear normal.  Nose: Mucosal edema and rhinorrhea present.  Mouth/Throat: Oropharynx is clear and moist and mucous membranes are normal. No trismus in the jaw. No oropharyngeal exudate, posterior oropharyngeal edema or posterior oropharyngeal erythema.  Eyes: Conjunctivae and EOM are normal. Pupils are equal, round, and reactive to light.  Neck: Normal range of motion. Neck supple.  Cardiovascular: Normal rate, regular rhythm, normal heart sounds and intact distal pulses.   Pulmonary/Chest: Effort normal. No respiratory distress. She has no wheezes.  Coarse upper  respiratory breath sounds. No wheezing.  Abdominal: Soft. Bowel sounds are normal. She exhibits no distension. There is no tenderness. There is no rebound and no guarding.  Musculoskeletal: She exhibits no edema.  Lymphadenopathy:    She has no cervical adenopathy.  Neurological: She is alert.  Skin: Skin is warm and dry. She is not diaphoretic.  Psychiatric: She has a normal mood and affect.  Nursing note and vitals reviewed.   ED Course  Procedures (including critical care time) Labs Review Labs Reviewed  CBC WITH DIFFERENTIAL/PLATELET - Abnormal; Notable for the following:    WBC 11.4 (*)    RBC 3.68 (*)    Hemoglobin 11.4 (*)    HCT 33.9 (*)    Monocytes Absolute 1.2 (*)    All other components within normal limits  COMPREHENSIVE METABOLIC PANEL - Abnormal; Notable for the following:    Glucose, Bld 109 (*)    Calcium 8.8 (*)    Total Protein 5.1 (*)    Albumin 2.6 (*)    ALT 13 (*)    Alkaline Phosphatase 35 (*)    Total Bilirubin 0.2 (*)    All other components within normal limits  LIPASE, BLOOD  I-STAT CG4 LACTIC ACID, ED    Imaging Review Dg Chest 2 View  04/08/2015  CLINICAL DATA:  20 year old female with bronchitis in November. Continued cough. Vomiting at night when lying down. Possible aspiration. Initial encounter. EXAM: CHEST  2 VIEW COMPARISON:  03/22/2015 and earlier. FINDINGS: Improved lung volumes compared to earlier this month. Normal cardiac size and mediastinal contours. Visualized tracheal air column is within normal limits. No pneumothorax, pulmonary edema, pleural effusion or confluent pulmonary opacity. Mildly coarse increased pulmonary interstitial markings again noted but appear chronic to a degree (mildly progressed compared to 2015). Mild scoliosis. No acute osseous abnormality identified. IMPRESSION: No acute cardiopulmonary abnormality. Suspicion of mild chronic interstitial pulmonary changes which have increased since 2015. Electronically Signed    By: Odessa Fleming M.D.   On: 04/08/2015 13:26   Dg Abd 1 View  04/08/2015  CLINICAL DATA:  Emesis EXAM: ABDOMEN - 1 VIEW COMPARISON:  Acute abdomen series dated 05/31/2014. FINDINGS: Overall bowel gas pattern is nonobstructive. Fairly large amount of stool is seen within the colon and rectal  vault. No evidence of soft tissue mass or abnormal fluid collection. No evidence of free intraperitoneal air. Osseous structures are unremarkable. IMPRESSION: Nonobstructive bowel gas pattern. Fairly large amount of stool within the colon and rectal vault (constipation? ). Electronically Signed   By: Bary Richard M.D.   On: 04/08/2015 13:26   I have personally reviewed and evaluated these images and lab results as part of my medical decision-making.   EKG Interpretation None      MDM   Final diagnoses:  Emesis  Cough    Labs with slight WBC coung to 11.4, otherwise at pt's baseline. CMP at baseline. Negative lactic acid. Tachycardia improved with 1L NS bolus. CXR negative. Abd XR suggestive of possible constipation but no e/o SBO. [name] endorses constipation which is chronic. Pt is well-appearing and cheerful. She is drinking Sprite and eating peanut butter crackers on multiple reassessments and I have not witnessed any episodes of emesis or dry heaving. Given report of ongoing intermittent emesis will refer to GI. Otherwise instructed to f/u with PCP and pulm as scheduled. ER return precautions given.    Carlene Coria, PA-C 04/08/15 1457  Pricilla Loveless, MD 04/09/15 [phone]

## 2015-04-08 NOTE — Discharge Instructions (Signed)
Lori Miles was seen in the ER today for evaluation of vomiting and coughing. Her x-rays and bloodwork were normal. As we discussed, please call GI to schedule an evaluation for [facility] since she has ongoing issues with vomiting. Please also follow up with her pulmonologist regarding her ongoing cough and intermittent wheezing. Return to the ER for new or worsening symptoms.

## 2015-04-08 NOTE — ED Notes (Signed)
Per Pt mother- Pt has had a cough when lying down at night. Pt mother reports that when she coughs she throws up. Pt has seen a lung MD for a diagnosis in November/Dec of bronchitis and pneumonia. Pt has also seen a ENT for this. Pt mother upset with the care she has received at [facility]. Pt has been taking protonix and other medications for GERD but still will cough when lying down leading to emesis.

## 2015-04-10 ENCOUNTER — Other Ambulatory Visit: Payer: Self-pay | Admitting: Allergy and Immunology

## 2015-04-10 NOTE — Telephone Encounter (Signed)
Mom called about meds not been sent in she needs her meds because she is almost out. Need albuterol and pulmicort and nose spray. [phone]. Pharmacy is [facility] on [address].

## 2015-04-10 NOTE — Telephone Encounter (Signed)
Spoke with mother about medications sent to pharmacy and mother said that the pharmacy is waiting for [name] to fill out a form from the pharmacy that we just received from the fax machine today. Advised that we would speak with the pharmacist at [facility] on [address]. We only received a form for Budesonide sup.

## 2015-04-10 NOTE — Telephone Encounter (Signed)
Refaxed forms to patients pharmacy. Called and left voicemail for mother to return phone call.

## 2015-04-11 ENCOUNTER — Other Ambulatory Visit: Payer: Self-pay

## 2015-04-11 MED ORDER — MOMETASONE FUROATE 50 MCG/ACT NA SUSP: NASAL | Status: DC

## 2015-04-13 ENCOUNTER — Telehealth: Payer: Self-pay | Admitting: Internal Medicine

## 2015-04-13 NOTE — Telephone Encounter (Signed)
Patient Returned call  [phone]

## 2015-04-13 NOTE — Telephone Encounter (Signed)
Fine with me but needs to be sure: When return bring your medications in 2 separate bags, the ones you take no matter(automatically)  what vs the as needed (only when you feel you need them)

## 2015-04-13 NOTE — Telephone Encounter (Signed)
Spoke with pt's mother. States that she would like a second opinion with BQ. Feels that seeing MW is not going to get her daughter better.  MW - are you okay with pt switching to BQ? Thanks.

## 2015-04-13 NOTE — Telephone Encounter (Signed)
lmtcb x1 for pt's mother. 

## 2015-04-13 NOTE — Telephone Encounter (Signed)
Dr Kendrick Fries, please advise if okay to schedule appt, thanks!

## 2015-04-14 NOTE — Telephone Encounter (Signed)
Spoke with patient's mom, she needed to reschedule the appointment on Monday 04/17/15 due to conflict with another appointment; patient has been rescheduled for 06/16/15 at 2:45pm.  Patient's mom says that she will contact our office periodically in February to see if BQ has any cancellations so patient can be seen sooner.  Nothing further needed.

## 2015-04-14 NOTE — Telephone Encounter (Signed)
Called and spoke with patient's mom, scheduled patient to see BQ on Monday 04/17/15 at 4:30pm.  Mom says that she will check her calendar and call me back because she may need to change that appointment due to other Dr. Sarita Bottom that are already scheduled for patient. Advised mom that BQ's next opening is not until March 28th.   Mom says she will call back today. Awaiting call back.

## 2015-04-14 NOTE — Telephone Encounter (Signed)
Mom calling back [phone]

## 2015-04-14 NOTE — Telephone Encounter (Signed)
OK 

## 2015-04-17 ENCOUNTER — Ambulatory Visit: Payer: Medicare Other | Admitting: Pulmonary Disease

## 2015-04-17 DIAGNOSIS — J4520 Mild intermittent asthma, uncomplicated: Secondary | ICD-10-CM | POA: Diagnosis not present

## 2015-04-17 DIAGNOSIS — E669 Obesity, unspecified: Secondary | ICD-10-CM | POA: Diagnosis not present

## 2015-04-17 DIAGNOSIS — R600 Localized edema: Secondary | ICD-10-CM | POA: Diagnosis not present

## 2015-04-17 DIAGNOSIS — R569 Unspecified convulsions: Secondary | ICD-10-CM | POA: Diagnosis not present

## 2015-04-17 DIAGNOSIS — Z6841 Body Mass Index (BMI) 40.0 and over, adult: Secondary | ICD-10-CM | POA: Diagnosis not present

## 2015-04-18 DIAGNOSIS — Z0000 Encounter for general adult medical examination without abnormal findings: Secondary | ICD-10-CM | POA: Diagnosis not present

## 2015-04-18 DIAGNOSIS — R350 Frequency of micturition: Secondary | ICD-10-CM | POA: Diagnosis not present

## 2015-04-18 DIAGNOSIS — N281 Cyst of kidney, acquired: Secondary | ICD-10-CM | POA: Diagnosis not present

## 2015-05-02 ENCOUNTER — Other Ambulatory Visit: Payer: Self-pay

## 2015-05-02 DIAGNOSIS — G40309 Generalized idiopathic epilepsy and epileptic syndromes, not intractable, without status epilepticus: Secondary | ICD-10-CM

## 2015-05-02 DIAGNOSIS — G40209 Localization-related (focal) (partial) symptomatic epilepsy and epileptic syndromes with complex partial seizures, not intractable, without status epilepticus: Secondary | ICD-10-CM

## 2015-05-02 MED ORDER — DEPAKOTE 500 MG PO TBEC: DELAYED_RELEASE_TABLET | ORAL | Status: DC

## 2015-05-03 ENCOUNTER — Other Ambulatory Visit: Payer: Self-pay

## 2015-05-03 DIAGNOSIS — J45901 Unspecified asthma with (acute) exacerbation: Secondary | ICD-10-CM

## 2015-05-03 MED ORDER — MONTELUKAST SODIUM 10 MG PO TABS: 10.0000 mg | ORAL_TABLET | Freq: Every day | ORAL | Status: DC

## 2015-05-03 NOTE — Telephone Encounter (Signed)
Refill done in epic and pharmacy notified.

## 2015-05-03 NOTE — Telephone Encounter (Signed)
R-A called for a refill request for Singulair pls advise

## 2015-05-05 ENCOUNTER — Ambulatory Visit (INDEPENDENT_AMBULATORY_CARE_PROVIDER_SITE_OTHER): Payer: Medicare Other | Admitting: Adult Health

## 2015-05-05 ENCOUNTER — Other Ambulatory Visit (INDEPENDENT_AMBULATORY_CARE_PROVIDER_SITE_OTHER): Payer: Medicare Other

## 2015-05-05 ENCOUNTER — Encounter: Payer: Self-pay | Admitting: Adult Health

## 2015-05-05 VITALS — BP 116/78 | HR 119 | Temp 97.3°F | Ht <= 58 in | Wt 198.0 lb

## 2015-05-05 DIAGNOSIS — R0600 Dyspnea, unspecified: Secondary | ICD-10-CM | POA: Diagnosis not present

## 2015-05-05 DIAGNOSIS — J40 Bronchitis, not specified as acute or chronic: Secondary | ICD-10-CM | POA: Insufficient documentation

## 2015-05-05 DIAGNOSIS — J209 Acute bronchitis, unspecified: Secondary | ICD-10-CM

## 2015-05-05 LAB — SEDIMENTATION RATE: SED RATE: 19 mm/h (ref 0–22)

## 2015-05-05 LAB — TSH: TSH: 1.33 u[IU]/mL (ref 0.35–5.50)

## 2015-05-05 LAB — BRAIN NATRIURETIC PEPTIDE: PRO B NATRI PEPTIDE: 9 pg/mL (ref 0.0–100.0)

## 2015-05-05 MED ORDER — CEFDINIR 300 MG PO CAPS: 300.0000 mg | ORAL_CAPSULE | Freq: Two times a day (BID) | ORAL | Status: DC

## 2015-05-05 NOTE — Progress Notes (Signed)
Agree with this plan with Omnicef, may need CT.

## 2015-05-05 NOTE — Progress Notes (Signed)
Subjective:    Patient ID: Lori Miles, female    DOB: 09/24/1995,     MRN: [id number]  HPI  19 yobf special ed pt ? Autistic/ never smoker followed by Dr Willa RoughHicks / Kozlow referred to pulmonary clinic 03/15/2015 s/p admit   Admit date: 02/14/2015 Discharge date: 02/15/2015     Recommendations for Outpatient Follow-up:  Note given for school   Discharge Diagnoses:  Principal Problem:  CAP (community acquired pneumonia) Active Problems:  Seizure disorder (HCC)  Asthma  Chronic constipation  Interstitial cystitis  Obesity (BMI 30-39.9)  Mental retardation, 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)  Autism spectrum disorder  Acute respiratory failure (HCC)  Nausea & vomiting   Discharge Condition: improved  Diet recommendation: regular  Filed Weights   02/14/15 1901  Weight: 78.699 kg (173 lb 8 oz)    History of present illness:  This is a 20 year old female patient without Autism spectrum disorder/developmental delay, chronic interstitial cystitis, chronic constipation, seizure disorder, and asthma. Patient's mother's the primary historian and reports for about 3 weeks patient has had increased cough. Patient does have chronic wheezing and occasional coughing but she has been progressively worsening and now has green productive sputum. She was evaluated as an outpatient by primary care physician and started on antibiotics and steroids and nebs without any improvement in her symptoms. The patient has been experiencing nausea and vomiting specially after coughing episode. Because of persistent symptomatology and patient inability to keep medications down her mother brought her to the ER for further evaluation.  In the ER the patient had low-grade fever 99.3, BP was stable at 114/73 pulse was 98 and regular respirations 20. Room air saturations were 98%. Chest x-ray completed on 11/28 revealed new airspace opacification in the right lower lobe worrisome for pneumonia. In the  ER today the patient was given duo neb along with a liter of fluid and Zofran with improvement in her wheezing but patient continued to have issues with nausea after eating although she has not yet vomited since attempting solid food earlier today. Chest x-ray concerning for aspiration component especially with history of chronic wheezing and recurrent cough and what sounds like symptoms consistent with vocal cord dysfunction.  Hospital Course:  [facility] PNA -steroid taper -finish course of abx -much improved -no aspiration issue      03/15/2015 1st [facility] office visit/ Wert  On pulmicort 0.5 qid and prn saba hasn't used latter x 24 h Chief Complaint  Patient presents with  . Pulmonary Consult    Hospital discharge stating pt had pneumonia. Mother states breathing problems since August 2016. Mother states pt increased SOB, wheezing, and "gasping for air" at night   had been on chronic prednisone x months but apparently no more pred since early December 2016 (mother an nurse  held up bottle of doxy confused with pred, and singulair confused with gerd rx) - noct wheeze no better on saba per mother. When pt is asked if she's trouble breathing she nods know, when asked where she has pain she point to her neck (middle to upper) and mother does report severe dry coughing fits esp supine  The mother, the main caretaker, failed  to answer a single question asked in a straightforward manner, tending to go off on tangents or answer questions with ambiguous medical terms or diagnoses and seemed aggravated to the point of anger when asked the same question more than once for clarification.   As far as I could tell through the mother >>  No obvious  patterns in day to day or daytime variabilty or assoc excess/ purulent sputum or mucus plugs  or cp or chest tightness, subjective wheeze overt sinus or hb symptoms. No unusual exp hx or h/o childhood pna/ asthma or knowledge of premature birth.  Also  denies any obvious fluctuation of symptoms with weather or environmental changes or other aggravating or alleviating factors except as outlined above    05/05/2015 Acute OV : Cough /sob  Pt presents with her mother for an acute office visit.  pt has prod cough with green to brown colored mucus, SOB, chest congestion, wheezing, and sinus drainage. She has been having trouble for several months with ongoing cough, sob , decreased activity level. Mom says her normal activity is she plays and runs at school but for several months does not want to do anything but sit around . She will walk for a distance then just sit down in floor to rest.  She is special needs with Cerebral palsy and autism with seizure disorder. She is followed by neuro on depakote.  Walk test in office today with no desats but HR goes up to 140 with walking.  She is on BCP for menses management.  She does have chronic UTI /IC uses macrodantin but not on regular basis , says not taken in >6 month.  Has constant nasal drainage.. Says she does have snoring but sleep test done 102016 was neg for OSA>  Previous spirometry showed no airflow obstruction. Unable to do FENO testing in office previously.  She does have ankle swelling.  WT  Is up 50lbs over last 2 years.   Denies any chest tightness, sinus congestion, fever, nausea or vomiting.    Current Medications, Allergies, Complete Past Medical History, Past Surgical History, Family History, and Social History were reviewed in [employer] record.          .    Review of Systems  Constitutional: Negative for fever, chills and unexpected weight change.  HENT: Negative for congestion, dental problem, ear pain, nosebleeds, postnasal drip, rhinorrhea, sinus pressure, sneezing, sore throat, trouble swallowing and voice change.   Eyes: Negative for visual disturbance.  Respiratory: Positive for shortness of breath Negative for cough and choking.     Cardiovascular: Negative for chest pain and leg swelling.  Gastrointestinal: Negative for vomiting, abdominal pain and diarrhea.  Genitourinary: Negative for difficulty urinating.  Musculoskeletal: Negative for arthralgias.  Skin: Negative for rash.  Neurological: Negative for tremors, syncope and headaches.  Hematological: Does not bruise/bleed easily.       Objective:   Physical Exam   amb young obese  bf nad watching IPAD show , responses w/ simple reply  Filed Vitals:   05/05/15 1456  BP: 116/78  Pulse: 119  Temp: 97.3 F (36.3 C)  TempSrc: Oral  Height:  (1.448 m)  Weight: 198 lb (89.812 kg)  SpO2: 99%    Vital signs reviewed    HEENT: nl dentition, turbinates, and oropharynx. Nl external ear canals without cough reflex Class 2-3 MP airway     NECK :  without JVD/Nodes/TM/ nl carotid upstrokes bilaterally   LUNGS: no acc muscle use,  Nl contour chest which is clear to A and P bilaterally without cough on insp or exp maneuvers   CV:  RRR  no s3 or murmur or increase in P2, no edema   ABD:  soft and nontender with nl inspiratory excursion in the supine position. No bruits or  organomegaly, bowel sounds nl  MS:  Nl gait/ ext warm without deformities, calf tenderness, cyanosis or clubbing No obvious joint restrictions   SKIN: warm and dry without lesions    NEURO:  alert, approp, nl sensorium with  no motor deficits          CXR:  02/26/15  No acute abnormality noted.          Assessment & Plan:

## 2015-05-05 NOTE — Patient Instructions (Addendum)
Omnicef  Twice daily  For 7 days  Labs today .  Mucinex DM Twice daily  As needed  Cough/congestion.  Follow up Dr Kendrick Fries next month as planned and As needed

## 2015-05-05 NOTE — Assessment & Plan Note (Signed)
Ongoing Dyspnea ? Etiology  Previous spirometry with no airflow obstruction noted.  No desats with walking  Will check D Dimer as she is on BCP . If positive will need CTA chest  Also check TSH and ESR .  Check BNP - if elevated consider 2 D echo .  follow up in few with DR. McQuaid,   

## 2015-05-05 NOTE — Assessment & Plan Note (Signed)
Recurrent flare  Recent cxr w/ no acute process . Did show some mild increased markings.  If persist consider CT chest to evaluate lung parenchymal closer.   Plan  Omnicef  Twice daily  For 7 days  Labs today .  Mucinex DM Twice daily  As needed  Cough/congestion.  Follow up Dr Kendrick Fries next month as planned and As needed

## 2015-05-06 LAB — D-DIMER, QUANTITATIVE: D-Dimer, Quant: 0.53 ug/mL-FEU — ABNORMAL HIGH (ref 0.00–0.48)

## 2015-05-09 ENCOUNTER — Other Ambulatory Visit: Payer: Self-pay | Admitting: Adult Health

## 2015-05-09 ENCOUNTER — Telehealth: Payer: Self-pay | Admitting: Adult Health

## 2015-05-09 ENCOUNTER — Telehealth: Payer: Self-pay | Admitting: Pulmonary Disease

## 2015-05-09 ENCOUNTER — Ambulatory Visit (INDEPENDENT_AMBULATORY_CARE_PROVIDER_SITE_OTHER)
Admission: RE | Admit: 2015-05-09 | Discharge: 2015-05-09 | Disposition: A | Discharge status: Home / Self Care | Payer: Medicare Other | Source: Ambulatory Visit | Attending: Adult Health | Admitting: Adult Health

## 2015-05-09 DIAGNOSIS — R0602 Shortness of breath: Secondary | ICD-10-CM | POA: Diagnosis not present

## 2015-05-09 DIAGNOSIS — R0600 Dyspnea, unspecified: Secondary | ICD-10-CM

## 2015-05-09 DIAGNOSIS — I2699 Other pulmonary embolism without acute cor pulmonale: Secondary | ICD-10-CM

## 2015-05-09 DIAGNOSIS — R000 Tachycardia, unspecified: Secondary | ICD-10-CM

## 2015-05-09 MED ORDER — IOHEXOL 350 MG/ML SOLN
80.0000 mL | Freq: Once | INTRAVENOUS | Status: AC | PRN
  Administered 2015-05-09: 80 mL via INTRAVENOUS

## 2015-05-09 NOTE — Telephone Encounter (Signed)
Per CTA: Result Notes     Notes Recorded by Julio Sicks, NP on 05/09/2015 at 11:34 AM No sign of PE , nothing to explain dyspnea.  Cont w/ ov rec s Please contact office for sooner follow up if symptoms do not improve or worsen or seek emergency care    --  Called spoke with pt mother and she is aware of results. She had no questions. She reports that pt heart rate is still elevated. She wants to know if we should still do something to check out her heart per mother. Also reports pt is still having a lot of congestion that is causing her to vomit. Pt is on ABX cefdinir that she recently started. Please advise TP thanks

## 2015-05-09 NOTE — Telephone Encounter (Signed)
Yes can refer to cardiology for evaluation with persistent tachycardia.

## 2015-05-09 NOTE — Telephone Encounter (Signed)
Called spoke with pt mom and is aware of below.  I have placed referral. Nothing further needed

## 2015-05-09 NOTE — Telephone Encounter (Signed)
Patient's mom called and wanted to know if patient can stop taking the PEPCID and PRILOSEC?  She said that she does not think that her daughter has acid reflux, she said that the cough is causing her to throw up.  She said that when she was coughing up her cold last night, she was coughing up blood and her nose bleeding.  She said that she doesn't think that the [facility] and PRILOSEC are working.  TP - please advise.

## 2015-05-09 NOTE — Telephone Encounter (Signed)
Per Verbal order from TP Okay to stop the Prilosec and Pepcid   Called and spoke with the pt's mother. Informed her of TP's recs. She voiced understanding and had no further questions. Nothing further needed at this time.

## 2015-05-10 ENCOUNTER — Telehealth: Payer: Self-pay | Admitting: Adult Health

## 2015-05-10 ENCOUNTER — Encounter: Payer: Self-pay | Admitting: Internal Medicine

## 2015-05-10 DIAGNOSIS — K219 Gastro-esophageal reflux disease without esophagitis: Secondary | ICD-10-CM

## 2015-05-10 DIAGNOSIS — R112 Nausea with vomiting, unspecified: Secondary | ICD-10-CM

## 2015-05-10 NOTE — Telephone Encounter (Signed)
Called and spoke to pt's mother, [name]. [name] is requesting a referral to GI for pt's reflux and emesis. [name] states she spoke with TP about this at pt's last OV on 2.17.17. [name] is requesting the referral be sent to Dr. Rhea Belton as this is who the pt's mother sees for GI.   TP please advise. Thanks.

## 2015-05-10 NOTE — Telephone Encounter (Signed)
That is fine,t hank you.

## 2015-05-10 NOTE — Telephone Encounter (Signed)
Called spoke with pt mother and is aware referral placed. Nothing further needed

## 2015-05-14 NOTE — Progress Notes (Signed)
Electrophysiology Office Note   Date:  05/15/2015   ID:  Lori Miles, DOB Dec 21, 1995, MRN [id number]  PCP:  Dorrene German, MD  Cardiologist:   Regan Lemming, MD   Chief Complaint  Patient presents with  . New Patient (Initial Visit)     History of Present Illness: Lori Miles is a 20 y.o. female who presents today for electrophysiology evaluation.   She has a history of cerebral palsy, seizures, and autism. She presents today with tachycardia, and shortness of breath. Per her mother, she has been tachycardic for the last year, which has not been investigated. Her EKG shows sinus tachycardia with a rate of 103. Her mother says that her heart rate does go up and down at times this usually happens when she is been sick. She is also been short of breath. She is unable to walk long distances and does not climb stairs anymore. She wakes up in the middle of night feeling short of breath and is unable to lie flat at this time.she also has lower extremity edema. She has been on in his own in the past but has not taken it for a year.  Today, she denies symptoms of palpitations, chest pain, claudication, dizziness, presyncope, syncope, bleeding, or neurologic sequela. The patient is tolerating medications without difficulties and is otherwise without complaint today.    Past Medical History  Diagnosis Date  . CP (cerebral palsy) (HCC)     mom denies  . Asthma   . Mental deficiency     hx of aggressive behavior,impaired speech   . Constipation   . Allergy   . Autism     mom states no actual dx, but admits she has some symptoms  . Frequency-urgency syndrome      some incontinence ; wears pull-ups  . Seizure (HCC)     most recent sz " at the end of summer"  . Interstitial cystitis   . Bronchitis    Past Surgical History  Procedure Laterality Date  . Cystoscopy    . Cystoscopy with hydrodistension and biopsy  04/14/2012    Procedure: CYSTOSCOPY/BIOPSY/HYDRODISTENSION;   Surgeon: Lindaann Slough, MD;  Location: [facility];  Service: Urology;;  instillation of marcaine and pyridium     Current Outpatient Prescriptions  Medication Sig Dispense Refill  . albuterol (PROVENTIL) (2.5 MG/3ML) 0.083% nebulizer solution Take 3 mLs (2.5 mg total) by nebulization every 4 (four) hours as needed for wheezing or shortness of breath (wheezing). 180 mL 1  . budesonide (PULMICORT) 0.5 MG/2ML nebulizer solution Take 2 mLs (0.5 mg total) by nebulization 2 (two) times daily. One vial twice daily 180 mL 4  . cefdinir (OMNICEF) 300 MG capsule Take 1 capsule (300 mg total) by mouth 2 (two) times daily. 14 capsule 0  . cetirizine (ZYRTEC) 10 MG tablet Take 1 tablet (10 mg total) by mouth daily. 30 tablet 4  . cloNIDine (CATAPRES) 0.1 MG tablet Take 1 tab by mouth at bedtime. (Patient taking differently: Take 0.15 mg by mouth at bedtime. Take 1 tab by mouth at bedtime.) 31 tablet 5  . Cranberry 500 MG CAPS Take 500 mg by mouth daily.    Marland Kitchen DEPAKOTE 500 MG DR tablet Take 1 tablet twice per day 60 tablet 5  . famotidine (PEPCID) 20 MG tablet One at bedtime (Patient taking differently: Take 20 mg by mouth at bedtime. One at bedtime) 30 tablet 2  . fluconazole (DIFLUCAN) 100 MG tablet take 1 tablet by  mouth once daily as needed for yeast infection 2 tablet 4  . hyoscyamine (LEVSIN SL) 0.125 MG SL tablet DISSOLVE 1 TABLET UNDER THE TONGUE 2 TIMES A DAY  0  . Levonorgestrel-Ethinyl Estradiol (SEASONIQUE) 0.15-0.03 &0.01 MG tablet Take 1 tablet by mouth daily. Take 1 active tablet daily, skip non active pills. 1 Package 11  . Melatonin 3 MG TABS Take 3 mg by mouth at bedtime. For insomnia    . mometasone (NASONEX) 50 MCG/ACT nasal spray One-Two sprays each in each nostril 17 g 5  . montelukast (SINGULAIR) 10 MG tablet Take 1 tablet (10 mg total) by mouth at bedtime. 30 tablet 3  . Multiple Vitamin (MULTIVITAMIN WITH MINERALS) TABS tablet Take 1 tablet by mouth daily.    .  nitrofurantoin, macrocrystal-monohydrate, (MACROBID) 100 MG capsule Take 1 capsule (100 mg total) by mouth 2 (two) times daily. (Patient taking differently: Take 100 mg by mouth 2 (two) times daily as needed. ) 10 capsule 0  . nystatin-triamcinolone (MYCOLOG II) cream Apply 1 application topically daily as needed. Reported on 03/08/2015  0  . Olopatadine HCl 0.6 % SOLN Use one spray twice daily for stuffy nose or drainage. 1 Bottle 5  . ondansetron (ZOFRAN ODT) 8 MG disintegrating tablet Take 1 tablet (8 mg total) by mouth every 8 (eight) hours as needed for nausea or vomiting. 10 tablet 0  . pantoprazole (PROTONIX) 40 MG tablet Take 1 tablet (40 mg total) by mouth daily. Take 30-60 min before first meal of the day 30 tablet 2  . promethazine-codeine (PHENERGAN WITH CODEINE) 6.25-10 MG/5ML syrup Take 5 mLs by mouth every 4 (four) hours as needed for cough. 473 mL 0  . senna (SENOKOT) 8.6 MG tablet Take 1 tablet by mouth daily.    Marland Kitchen torsemide (DEMADEX) 10 MG tablet Take 10 mg by mouth daily.    . traZODone (DESYREL) 50 MG tablet Take 2 tabs by mouth at bedtime (Patient taking differently: Take 100 mg by mouth at bedtime. Take 2 tabs by mouth at bedtime) 62 tablet 1  . trimethoprim (TRIMPEX) 100 MG tablet Take 100 mg by mouth every morning.     . vitamin C (ASCORBIC ACID) 500 MG tablet Take 500 mg by mouth daily.     No current facility-administered medications for this visit.    Allergies:   Abilify; Seroquel; Amoxicillin-pot clavulanate; and Keppra   Social History:  The patient  reports that she has never smoked. She has never used smokeless tobacco. She reports that she does not drink alcohol or use illicit drugs.   Family History:  The patient's family history includes Seizures in her mother and sister.    ROS:  Please see the history of present illness.   Otherwise, review of systems is positive for leg swelling, waking up SOB, SOB when lying flat, cough, DOE, snoring, wheezing.   All other  systems are reviewed and negative.    PHYSICAL EXAM: VS:  BP 126/78 mmHg  Pulse 103  Ht 4' 9.5" (1.461 m)  Wt 194 lb 3.2 oz (88.089 kg)  BMI 41.27 kg/m2 , BMI Body mass index is 41.27 kg/(m^2). GEN: Well nourished, well developed, in no acute distress HEENT: normal Neck: no JVD, carotid bruits, or masses Cardiac: RRR; no murmurs, rubs, or gallops, 1+ edema Respiratory:  clear to auscultation bilaterally, normal work of breathing GI: soft, nontender, nondistended, + BS MS: no deformity or atrophy Skin: warm and dry Neuro:  Strength and sensation are intact Psych:  euthymic mood, full affect  EKG:  EKG is ordered today. The ekg ordered today shows sinus tachycardia, rate 103  Recent Labs: 10/24/2014: B Natriuretic Peptide 16.1 04/08/2015: ALT 13*; BUN 7; Creatinine, Ser 0.81; Hemoglobin 11.4*; Platelets 194; Potassium 4.1; Sodium 141 05/05/2015: Pro B Natriuretic peptide (BNP) 9.0; TSH 1.33    Lipid Panel  No results found for: CHOL, TRIG, HDL, CHOLHDL, VLDL, LDLCALC, LDLDIRECT   Wt Readings from Last 3 Encounters:  05/15/15 194 lb 3.2 oz (88.089 kg)  05/05/15 198 lb (89.812 kg)  04/04/15 191 lb 9.6 oz (86.909 kg)   ASSESSMENT AND PLAN:  1.  Shortness of breath: currently she has shortness of breath, with possibly PND type symptoms as well as orthopnea. She also has lower extremity edema on her exam and is in sinus tachycardia. Due to those findings, we'll order a echocardiogram to determine if she has any heart failure.  2. Sinus tachycardia: possibly due to heart failure, we'll order an echocardiogram. If that is negative I have told the family that we might benefit from a 48 hour monitor to determine what her average heart rate is.    Current medicines are reviewed at length with the patient today.   The patient does not have concerns regarding her medicines.  The following changes were made today:  none  Labs/ tests ordered today include:  Orders Placed This Encounter    Procedures  . EKG 12-Lead  . ECHOCARDIOGRAM COMPLETE     Disposition:   FU with Shelanda Duvall pending TTE results  Signed, Lenisha Lacap Jorja Loa, MD  05/15/2015 10:56 AM     [facility] [address] [place] [address] [phone] (office) [phone] (fax)

## 2015-05-15 ENCOUNTER — Encounter: Payer: Self-pay | Admitting: *Deleted

## 2015-05-15 ENCOUNTER — Ambulatory Visit (INDEPENDENT_AMBULATORY_CARE_PROVIDER_SITE_OTHER): Payer: Medicare Other | Admitting: Cardiology

## 2015-05-15 ENCOUNTER — Encounter: Payer: Self-pay | Admitting: Cardiology

## 2015-05-15 VITALS — BP 126/78 | HR 103 | Ht <= 58 in | Wt 194.2 lb

## 2015-05-15 DIAGNOSIS — R0601 Orthopnea: Secondary | ICD-10-CM | POA: Diagnosis not present

## 2015-05-15 DIAGNOSIS — R0602 Shortness of breath: Secondary | ICD-10-CM

## 2015-05-15 DIAGNOSIS — R000 Tachycardia, unspecified: Secondary | ICD-10-CM | POA: Diagnosis not present

## 2015-05-15 NOTE — Patient Instructions (Addendum)
Medication Instructions:  Your physician recommends that you continue on your current medications as directed. Please refer to the Current Medication list given to you today.  Labwork: None ordered  Testing/Procedures: Your physician has requested that you have an echocardiogram. Echocardiography is a painless test that uses sound waves to create images of your heart. It provides your doctor with information about the size and shape of your heart and how well your heart's chambers and valves are working. This procedure takes approximately one hour. There are no restrictions for this procedure.  Follow-Up: To be determined once echo is reviewed by physician.  Any Other Special Instructions Will Be Listed Below (If Applicable).  If you need a refill on your cardiac medications before your next appointment, please call your pharmacy.  Thank you for choosing [facility]!!   Dory Horn, RN [phone]

## 2015-05-28 ENCOUNTER — Encounter (HOSPITAL_COMMUNITY): Payer: Self-pay | Admitting: Emergency Medicine

## 2015-05-28 ENCOUNTER — Emergency Department (HOSPITAL_COMMUNITY): Payer: Medicare Other

## 2015-05-28 ENCOUNTER — Emergency Department (HOSPITAL_COMMUNITY)
Admission: EM | Admit: 2015-05-28 | Discharge: 2015-05-28 | Disposition: A | Discharge status: Home / Self Care | Payer: Medicare Other | Attending: Emergency Medicine | Admitting: Emergency Medicine

## 2015-05-28 DIAGNOSIS — R040 Epistaxis: Secondary | ICD-10-CM | POA: Diagnosis not present

## 2015-05-28 DIAGNOSIS — Z8669 Personal history of other diseases of the nervous system and sense organs: Secondary | ICD-10-CM | POA: Insufficient documentation

## 2015-05-28 DIAGNOSIS — R1110 Vomiting, unspecified: Secondary | ICD-10-CM | POA: Insufficient documentation

## 2015-05-28 DIAGNOSIS — Z880 Allergy status to penicillin: Secondary | ICD-10-CM | POA: Insufficient documentation

## 2015-05-28 DIAGNOSIS — Z87448 Personal history of other diseases of urinary system: Secondary | ICD-10-CM | POA: Insufficient documentation

## 2015-05-28 DIAGNOSIS — K5900 Constipation, unspecified: Secondary | ICD-10-CM | POA: Insufficient documentation

## 2015-05-28 DIAGNOSIS — Z79899 Other long term (current) drug therapy: Secondary | ICD-10-CM | POA: Diagnosis not present

## 2015-05-28 DIAGNOSIS — J45909 Unspecified asthma, uncomplicated: Secondary | ICD-10-CM | POA: Insufficient documentation

## 2015-05-28 DIAGNOSIS — R000 Tachycardia, unspecified: Secondary | ICD-10-CM | POA: Diagnosis not present

## 2015-05-28 DIAGNOSIS — R05 Cough: Secondary | ICD-10-CM | POA: Diagnosis not present

## 2015-05-28 DIAGNOSIS — F840 Autistic disorder: Secondary | ICD-10-CM | POA: Diagnosis not present

## 2015-05-28 DIAGNOSIS — J329 Chronic sinusitis, unspecified: Secondary | ICD-10-CM | POA: Diagnosis not present

## 2015-05-28 DIAGNOSIS — Z7951 Long term (current) use of inhaled steroids: Secondary | ICD-10-CM | POA: Insufficient documentation

## 2015-05-28 DIAGNOSIS — R0981 Nasal congestion: Secondary | ICD-10-CM | POA: Diagnosis present

## 2015-05-28 DIAGNOSIS — D649 Anemia, unspecified: Secondary | ICD-10-CM | POA: Diagnosis not present

## 2015-05-28 LAB — COMPREHENSIVE METABOLIC PANEL
ALBUMIN: 3.1 g/dL — AB (ref 3.5–5.0)
ALK PHOS: 45 U/L (ref 38–126)
ALT: 14 U/L (ref 14–54)
ANION GAP: 10 (ref 5–15)
AST: 21 U/L (ref 15–41)
BUN: 5 mg/dL — ABNORMAL LOW (ref 6–20)
CALCIUM: 8.9 mg/dL (ref 8.9–10.3)
CO2: 25 mmol/L (ref 22–32)
Chloride: 105 mmol/L (ref 101–111)
Creatinine, Ser: 0.91 mg/dL (ref 0.44–1.00)
GFR calc non Af Amer: >60 mL/min (ref >60)
Glucose, Bld: 129 mg/dL — ABNORMAL HIGH (ref 65–99)
Potassium: 3.9 mmol/L (ref 3.5–5.1)
SODIUM: 140 mmol/L (ref 135–145)
TOTAL PROTEIN: 6.3 g/dL — AB (ref 6.5–8.1)
Total Bilirubin: 0.5 mg/dL (ref 0.3–1.2)

## 2015-05-28 LAB — CBC
HCT: 36.7 % (ref 36.0–46.0)
HEMOGLOBIN: 11.8 g/dL — AB (ref 12.0–15.0)
MCH: 29.2 pg (ref 26.0–34.0)
MCHC: 32.2 g/dL (ref 30.0–36.0)
MCV: 90.8 fL (ref 78.0–100.0)
Platelets: 318 10*3/uL (ref 150–400)
RBC: 4.04 MIL/uL (ref 3.87–5.11)
RDW: 14.1 % (ref 11.5–15.5)
WBC: 8.5 10*3/uL (ref 4.0–10.5)

## 2015-05-28 MED ORDER — CEFDINIR 300 MG PO CAPS: 300.0000 mg | ORAL_CAPSULE | Freq: Two times a day (BID) | ORAL | Status: DC

## 2015-05-28 NOTE — ED Notes (Signed)
Family member attempted to bring a 3rd visitor into triage. Informed visitor that really only 1 visitor is supposed to be in triage with patient. 3rd visitor heard conversation and went to sit back down in waiting area without any complaint. Family member upset that a 3rd family member was not allowed back into triage room. Explained to visitor that 1 visitor is usually allowed in triage area. Visitor then reports that the 3rd visitor works here and he knows the code to get into the door. Reports, "If he wants to come in he can just use the code." After triage visitor continued to talk about the fact that 3rd visitor could not come in. Offered for her to switch out the 2nd visitor, but she refused.

## 2015-05-28 NOTE — ED Provider Notes (Signed)
CSN: [id number]     Arrival date & time 05/28/15  1026 History   First MD Initiated Contact with Patient 05/28/15 1111     Chief Complaint  Patient presents with  . Epistaxis  . Nasal Congestion  . Emesis   Level V caveat patient mental retarded history is obtained from her parents  (Consider location/radiation/quality/duration/timing/severity/associated sxs/prior Treatment) HPI Complains of epistaxis from both nares and nasal congestion for several months, intermittent, waxes and wanes, essentially unchanged today. Also with cough for several months and she's been treated with antibiotics and with nasal spray, with transient relief. Other associated symptoms include subjective fever yesterday. She is presently asymptomatic. Denies shortness of breath. Past Medical History  Diagnosis Date  . CP (cerebral palsy) (HCC)     mom denies  . Asthma   . Mental deficiency     hx of aggressive behavior,impaired speech   . Constipation   . Allergy   . Autism     mom states no actual dx, but admits she has some symptoms  . Frequency-urgency syndrome      some incontinence ; wears pull-ups  . Seizure (HCC)     most recent sz " at the end of summer"  . Interstitial cystitis   . Bronchitis    Past Surgical History  Procedure Laterality Date  . Cystoscopy    . Cystoscopy with hydrodistension and biopsy  04/14/2012    Procedure: CYSTOSCOPY/BIOPSY/HYDRODISTENSION;  Surgeon: Lindaann Slough, MD;  Location: [facility] [place];  Service: Urology;;  instillation of marcaine and pyridium   Family History  Problem Relation Age of Onset  . Seizures Mother   . Seizures Sister     1 sister has   Social History  Substance Use Topics  . Smoking status: Never Smoker   . Smokeless tobacco: Never Used  . Alcohol Use: No   OB History    No data available     Review of Systems  Unable to perform ROS: Other  HENT: Positive for congestion and nosebleeds.   Respiratory: Positive for cough.    Cardiovascular:       Chronic resting tachycardia   mentally retarded    Allergies  Abilify; Seroquel; Amoxicillin-pot clavulanate; and Keppra  Home Medications   Prior to Admission medications   Medication Sig Start Date End Date Taking? Authorizing Provider  albuterol (PROVENTIL) (2.5 MG/3ML) 0.083% nebulizer solution Take 3 mLs (2.5 mg total) by nebulization every 4 (four) hours as needed for wheezing or shortness of breath (wheezing). 03/28/15   Roselyn Kara Mead, MD  budesonide (PULMICORT) 0.5 MG/2ML nebulizer solution Take 2 mLs (0.5 mg total) by nebulization 2 (two) times daily. One vial twice daily 03/28/15   Baxter Hire, MD  cefdinir (OMNICEF) 300 MG capsule Take 1 capsule (300 mg total) by mouth 2 (two) times daily. 05/05/15   [name] S Parrett, NP  cetirizine (ZYRTEC) 10 MG tablet Take 1 tablet (10 mg total) by mouth daily. 03/30/15   Roselyn Kara Mead, MD  cloNIDine (CATAPRES) 0.1 MG tablet Take 1 tab by mouth at bedtime. Patient taking differently: Take 0.15 mg by mouth at bedtime. Take 1 tab by mouth at bedtime. 11/16/14   Deetta Perla, MD  Cranberry 500 MG CAPS Take 500 mg by mouth daily.    Historical Provider, MD  DEPAKOTE 500 MG DR tablet Take 1 tablet twice per day 05/02/15   Elveria Rising, NP  famotidine (PEPCID) 20 MG tablet One at bedtime Patient taking differently:  Take 20 mg by mouth at bedtime. One at bedtime 03/15/15   Nyoka Cowden, MD  fluconazole (DIFLUCAN) 100 MG tablet take 1 tablet by mouth once daily as needed for yeast infection 02/15/15   Joseph Art, DO  hyoscyamine (LEVSIN SL) 0.125 MG SL tablet DISSOLVE 1 TABLET UNDER THE TONGUE 2 TIMES A DAY 08/08/14   Historical Provider, MD  Levonorgestrel-Ethinyl Estradiol (SEASONIQUE) 0.15-0.03 &0.01 MG tablet Take 1 tablet by mouth daily. Take 1 active tablet daily, skip non active pills. 10/27/13   Antionette Char, MD  Melatonin 3 MG TABS Take 3 mg by mouth at bedtime. For insomnia    Historical  Provider, MD  mometasone (NASONEX) 50 MCG/ACT nasal spray One-Two sprays each in each nostril 04/11/15   Roselyn Kara Mead, MD  montelukast (SINGULAIR) 10 MG tablet Take 1 tablet (10 mg total) by mouth at bedtime. 05/03/15   Roselyn Kara Mead, MD  Multiple Vitamin (MULTIVITAMIN WITH MINERALS) TABS tablet Take 1 tablet by mouth daily.    Historical Provider, MD  nitrofurantoin, macrocrystal-monohydrate, (MACROBID) 100 MG capsule Take 1 capsule (100 mg total) by mouth 2 (two) times daily. Patient taking differently: Take 100 mg by mouth 2 (two) times daily as needed.  11/21/14   Hayden Rasmussen, NP  nystatin-triamcinolone (MYCOLOG II) cream Apply 1 application topically daily as needed. Reported on 03/08/2015 02/10/15   Historical Provider, MD  Olopatadine HCl 0.6 % SOLN Use one spray twice daily for stuffy nose or drainage. 03/08/15   Roselyn Kara Mead, MD  ondansetron (ZOFRAN ODT) 8 MG disintegrating tablet Take 1 tablet (8 mg total) by mouth every 8 (eight) hours as needed for nausea or vomiting. 02/15/15   Joseph Art, DO  pantoprazole (PROTONIX) 40 MG tablet Take 1 tablet (40 mg total) by mouth daily. Take 30-60 min before first meal of the day 03/15/15   Nyoka Cowden, MD  promethazine-codeine [facility] WITH CODEINE) 6.25-10 MG/5ML syrup Take 5 mLs by mouth every 4 (four) hours as needed for cough. 03/15/15   Nyoka Cowden, MD  senna (SENOKOT) 8.6 MG tablet Take 1 tablet by mouth daily.    Historical Provider, MD  torsemide (DEMADEX) 10 MG tablet Take 10 mg by mouth daily.    Historical Provider, MD  traZODone (DESYREL) 50 MG tablet Take 2 tabs by mouth at bedtime Patient taking differently: Take 100 mg by mouth at bedtime. Take 2 tabs by mouth at bedtime 03/01/15   Elveria Rising, NP  trimethoprim (TRIMPEX) 100 MG tablet Take 100 mg by mouth every morning.     Historical Provider, MD  vitamin C (ASCORBIC ACID) 500 MG tablet Take 500 mg by mouth daily.    Historical Provider, MD   BP 87/73 mmHg   Pulse 110  Temp(Src) 98.7 F (37.1 C) (Oral)  Resp 18  Ht 4' 9.5" (1.461 m)  Wt 192 lb 9 oz (87.346 kg)  BMI 40.92 kg/m2  SpO2 99% Physical Exam  Constitutional: She appears well-developed and well-nourished. No distress.  HENT:  Head: Normocephalic and atraumatic.  No blood in oropharynx no blood in nares  Eyes: Conjunctivae are normal. Pupils are equal, round, and reactive to light.  Neck: Neck supple. No tracheal deviation present. No thyromegaly present.  Cardiovascular: Normal rate and regular rhythm.   No murmur heard. Pulmonary/Chest: Effort normal and breath sounds normal.  Abdominal: Soft. Bowel sounds are normal. She exhibits no distension. There is no tenderness.  Obese  Musculoskeletal: Normal range of motion.  She exhibits no edema or tenderness.  Neurological: She is alert. Coordination normal.  Skin: Skin is warm and dry. No rash noted.  Psychiatric: She has a normal mood and affect.  Nursing note and vitals reviewed.   ED Course  Procedures (including critical care time) Labs Review Labs Reviewed  COMPREHENSIVE METABOLIC PANEL  CBC    Imaging Review No results found. I have personally reviewed and evaluated these images and lab results as part of my medical decision-making.   EKG Interpretation None     Chest x-ray viewed by me. Results for orders placed or performed during the hospital encounter of 05/28/15  Comprehensive metabolic panel  Result Value Ref Range   Sodium 140 135 - 145 mmol/L   Potassium 3.9 3.5 - 5.1 mmol/L   Chloride 105 101 - 111 mmol/L   CO2 25 22 - 32 mmol/L   Glucose, Bld 129 (H) 65 - 99 mg/dL   BUN 5 (L) 6 - 20 mg/dL   Creatinine, Ser 1.61 0.44 - 1.00 mg/dL   Calcium 8.9 8.9 - 09.6 mg/dL   Total Protein 6.3 (L) 6.5 - 8.1 g/dL   Albumin 3.1 (L) 3.5 - 5.0 g/dL   AST 21 15 - 41 U/L   ALT 14 14 - 54 U/L   Alkaline Phosphatase 45 38 - 126 U/L   Total Bilirubin 0.5 0.3 - 1.2 mg/dL   GFR calc non Af Amer >60 >60 mL/min   GFR  calc Af Amer >60 >60 mL/min   Anion gap 10 5 - 15  CBC  Result Value Ref Range   WBC 8.5 4.0 - 10.5 K/uL   RBC 4.04 3.87 - 5.11 MIL/uL   Hemoglobin 11.8 (L) 12.0 - 15.0 g/dL   HCT 04.5 40.9 - 81.1 %   MCV 90.8 78.0 - 100.0 fL   MCH 29.2 26.0 - 34.0 pg   MCHC 32.2 30.0 - 36.0 g/dL   RDW 91.4 78.2 - 95.6 %   Platelets 318 150 - 400 K/uL   Dg Chest 2 View  05/28/2015  CLINICAL DATA:  Cough, congestion, vomiting since November EXAM: CHEST  2 VIEW COMPARISON:  04/08/2015 FINDINGS: Heart and mediastinal contours are within normal limits. No focal opacities or effusions. No acute bony abnormality. IMPRESSION: No active cardiopulmonary disease. Electronically Signed   By: Charlett Nose M.D.   On: 05/28/2015 12:01   Ct Angio Chest W/cm &/or Wo Cm  05/09/2015  CLINICAL DATA:  20 year old female with persistent shortness of breath, congestion, severe cough with vomiting following pneumonia in November. Subsequent encounter. EXAM: CT ANGIOGRAPHY CHEST WITH CONTRAST TECHNIQUE: Multidetector CT imaging of the chest was performed using the standard protocol during bolus administration of intravenous contrast. Multiplanar CT image reconstructions and MIPs were obtained to evaluate the vascular anatomy. CONTRAST:  80mL OMNIPAQUE IOHEXOL 350 MG/ML SOLN COMPARISON:  Chest radiographs 04/08/2015 and earlier. CT Abdomen and Pelvis 02/28/2014 FINDINGS: Good contrast bolus timing in the pulmonary arterial tree. Low lung volumes. No focal filling defect identified in the pulmonary arterial tree to suggest the presence of acute pulmonary embolism. Major airways are patent. Aside from crowding of lung markings which is slightly greater on the left, both lungs appear clear. No pleural effusion. No pericardial effusion. Negative thoracic aorta. No mediastinal or hilar lymphadenopathy. Negative thoracic inlet. No axillary lymphadenopathy. Stable and negative visualized liver, spleen, pancreas, adrenal glands, kidneys, and bowel  in the upper abdomen. No osseous abnormality identified. Review of the MIP images confirms the  above findings. IMPRESSION: 1.  No evidence of acute pulmonary embolus. 2. No abnormality identified in the chest other than crowding of lung markings from somewhat low lung volumes. These results will be called to the ordering clinician or representative by the Radiology Department at the imaging location. Electronically Signed   By: Odessa FlemingH  Hall M.D.   On: 05/09/2015 11:23    MDM  Left prolonged nasal congestion will treat with antibiotic. Patient's mother reports that she has [facility] well [facility]. We'll prescribe Omnicef. Also encourage saline nasal spray in each nostril every 2 hours while awake. Mother is encouraged to follow up with Ms.Parrett ,NP tomorrow Anemia is mild and chronic Final diagnoses:  None   Diagnosis #1sinusitis #2 anemia      Doug SouSam Afua Hoots, MD 05/28/15 1258

## 2015-05-28 NOTE — Discharge Instructions (Signed)
Sinusitis, Adult Squirt saline nasal spray one time into each nostril every 2 hours while awake.. If nose rebleeds, pinch the nostrils shut with fingers for 20 minutes. If bleeding won't stop, return to the emergency department. Contact Lori Miles tomorrow for follow-up in the office and please advise the office that Lori Miles was seen here. Sinusitis is redness, soreness, and inflammation of the paranasal sinuses. Paranasal sinuses are air pockets within the bones of your face. They are located beneath your eyes, in the middle of your forehead, and above your eyes. In healthy paranasal sinuses, mucus is able to drain out, and air is able to circulate through them by way of your nose. However, when your paranasal sinuses are inflamed, mucus and air can become trapped. This can allow bacteria and other germs to grow and cause infection. Sinusitis can develop quickly and last only a short time (acute) or continue over a long period (chronic). Sinusitis that lasts for more than 12 weeks is considered chronic. CAUSES Causes of sinusitis include:  Allergies.  Structural abnormalities, such as displacement of the cartilage that separates your nostrils (deviated septum), which can decrease the air flow through your nose and sinuses and affect sinus drainage.  Functional abnormalities, such as when the small hairs (cilia) that line your sinuses and help remove mucus do not work properly or are not present. SIGNS AND SYMPTOMS Symptoms of acute and chronic sinusitis are the same. The primary symptoms are pain and pressure around the affected sinuses. Other symptoms include:  Upper toothache.  Earache.  Headache.  Bad breath.  Decreased sense of smell and taste.  A cough, which worsens when you are lying flat.  Fatigue.  Fever.  Thick drainage from your nose, which often is green and may contain pus (purulent).  Swelling and warmth over the affected sinuses. DIAGNOSIS Your health care  provider will perform a physical exam. During your exam, your health care provider may perform any of the following to help determine if you have acute sinusitis or chronic sinusitis:  Look in your nose for signs of abnormal growths in your nostrils (nasal polyps).  Tap over the affected sinus to check for signs of infection.  View the inside of your sinuses using an imaging device that has a light attached (endoscope). If your health care provider suspects that you have chronic sinusitis, one or more of the following tests may be recommended:  Allergy tests.  Nasal culture. A sample of mucus is taken from your nose, sent to a lab, and screened for bacteria.  Nasal cytology. A sample of mucus is taken from your nose and examined by your health care provider to determine if your sinusitis is related to an allergy. TREATMENT Most cases of acute sinusitis are related to a viral infection and will resolve on their own within 10 days. Sometimes, medicines are prescribed to help relieve symptoms of both acute and chronic sinusitis. These may include pain medicines, decongestants, nasal steroid sprays, or saline sprays. However, for sinusitis related to a bacterial infection, your health care provider will prescribe antibiotic medicines. These are medicines that will help kill the bacteria causing the infection. Rarely, sinusitis is caused by a fungal infection. In these cases, your health care provider will prescribe antifungal medicine. For some cases of chronic sinusitis, surgery is needed. Generally, these are cases in which sinusitis recurs more than 3 times per year, despite other treatments. HOME CARE INSTRUCTIONS  Drink plenty of water. Water helps thin the mucus so your  sinuses can drain more easily.  Use a humidifier.  Inhale steam 3-4 times a day (for example, sit in the bathroom with the shower running).  Apply a warm, moist washcloth to your face 3-4 times a day, or as directed by  your health care provider.  Use saline nasal sprays to help moisten and clean your sinuses.  Take medicines only as directed by your health care provider.  If you were prescribed either an antibiotic or antifungal medicine, finish it all even if you start to feel better. SEEK IMMEDIATE MEDICAL CARE IF:  You have increasing pain or severe headaches.  You have nausea, vomiting, or drowsiness.  You have swelling around your face.  You have vision problems.  You have a stiff neck.  You have difficulty breathing.   This information is not intended to replace advice given to you by your health care provider. Make sure you discuss any questions you have with your health care provider.   Document Released: 03/04/2005 Document Revised: 03/25/2014 Document Reviewed: 03/19/2011 Elsevier Interactive Patient Education [employer]

## 2015-05-28 NOTE — ED Notes (Signed)
Patient transported to X-ray 

## 2015-05-28 NOTE — ED Notes (Signed)
Family reports that pt has had ongoing issues of nosebleed, nasal congestion, cold symptoms and vomiting since before Thanksgiving.

## 2015-05-29 ENCOUNTER — Encounter (HOSPITAL_COMMUNITY): Payer: Self-pay | Admitting: *Deleted

## 2015-05-29 ENCOUNTER — Encounter: Payer: Self-pay | Admitting: Adult Health

## 2015-05-29 ENCOUNTER — Telehealth: Payer: Self-pay | Admitting: Pulmonary Disease

## 2015-05-29 ENCOUNTER — Other Ambulatory Visit: Payer: Self-pay

## 2015-05-29 ENCOUNTER — Ambulatory Visit (INDEPENDENT_AMBULATORY_CARE_PROVIDER_SITE_OTHER): Payer: Medicare Other | Admitting: Adult Health

## 2015-05-29 ENCOUNTER — Ambulatory Visit (HOSPITAL_COMMUNITY): Payer: Medicare Other | Attending: Cardiology

## 2015-05-29 VITALS — BP 112/72 | HR 104 | Temp 98.2°F | Ht <= 58 in | Wt 192.0 lb

## 2015-05-29 DIAGNOSIS — R000 Tachycardia, unspecified: Secondary | ICD-10-CM | POA: Diagnosis not present

## 2015-05-29 DIAGNOSIS — R0602 Shortness of breath: Secondary | ICD-10-CM | POA: Insufficient documentation

## 2015-05-29 DIAGNOSIS — I071 Rheumatic tricuspid insufficiency: Secondary | ICD-10-CM | POA: Diagnosis not present

## 2015-05-29 DIAGNOSIS — R0601 Orthopnea: Secondary | ICD-10-CM | POA: Insufficient documentation

## 2015-05-29 DIAGNOSIS — J209 Acute bronchitis, unspecified: Secondary | ICD-10-CM

## 2015-05-29 DIAGNOSIS — R0600 Dyspnea, unspecified: Secondary | ICD-10-CM

## 2015-05-29 NOTE — Telephone Encounter (Signed)
See if she can come this am , Merry ProudBrandi has some openings

## 2015-05-29 NOTE — Telephone Encounter (Signed)
Patients mom called back concerned about her daughter waiting in the waiting room to see TP this afternoon.  She said that patient does not do well in a room full of people and she will "freak out".  I advised her that if we have an empty room that another provider is not using that we could put her in that room to wait until TP is ready for her to come back.  Mom seemed ok with that idea.  Advised her that I am not sure if we will have an empty room, but that we could try.    FYI to [employer]

## 2015-05-29 NOTE — Telephone Encounter (Signed)
Per TP  Okay to scheduled with her on 05/30/15

## 2015-05-29 NOTE — Telephone Encounter (Signed)
Patients mom called, says that patient had blood coming from mouth and nose this weekend and went to ER.  Blood pressure was very low, she doesn't understand why she was sent back home from hospital.  She was placed on another antibiotic. Cardiologist does not think it is heart related, he thinks it is something to do with her airway.  At hospital, patient's blood pressure was 87/73. Mom says that Saturday, 05/27/15 patient fell asleep at 10am and will not wake up until 5pm.  She said that before she started bleeding, she started screaming like she was in pain, then she started bleeding, so mom rushed her to ER.    ER Report stated:   Complains of epistaxis from both nares and nasal congestion for several months, intermittent, waxes and wanes, essentially unchanged today. Also with cough for several months and she's been treated with antibiotics and with nasal spray, with transient relief. Other associated symptoms include subjective fever yesterday. She is presently asymptomatic. Denies shortness of breath.  MDM  Left prolonged nasal congestion will treat with antibiotic. Patient's mother reports that [facility]. We'll prescribe Omnicef. Also encourage saline nasal spray in each nostril every 2 hours while awake. Mother is encouraged to follow up with Ms.Parrett ,NP tomorrow Anemia is mild and chronic       Patient has an appointment with Dr. Kendrick FriesMcQuaid on 06/16/15. TP, please advise.

## 2015-05-29 NOTE — Telephone Encounter (Signed)
Spoke with patient's mom and she would prefer to see TP.  (Brandi's schedule is full this morning as well) Please advise.

## 2015-05-29 NOTE — Patient Instructions (Signed)
Saline nasal rinses and gel as needed No picking at nose.  Continue on Zyrtec daily  Follow up with Dr. Kendrick FriesMcQuaid as planned and.As needed   Please contact office for sooner follow up if symptoms do not improve or worsen or seek emergency care

## 2015-05-29 NOTE — Telephone Encounter (Addendum)
Pt mother states that she cannot do the appt tomorrow, has to have transportation.  Pt requesting OV today if at all possible.  Per TP, can add patient on today at 345p Nothing further needed.

## 2015-06-01 ENCOUNTER — Telehealth: Payer: Self-pay | Admitting: Adult Health

## 2015-06-01 ENCOUNTER — Other Ambulatory Visit: Payer: Self-pay

## 2015-06-01 ENCOUNTER — Encounter: Payer: Self-pay | Admitting: Cardiology

## 2015-06-01 MED ORDER — ALBUTEROL SULFATE (2.5 MG/3ML) 0.083% IN NEBU: 2.5000 mg | INHALATION_SOLUTION | RESPIRATORY_TRACT | Status: DC | PRN

## 2015-06-01 NOTE — Telephone Encounter (Signed)
Spoke with pt's mother, aware of letter.  Will pick up letter tomorrow.  Letter typed and left up front.  Nothing further needed.

## 2015-06-01 NOTE — Telephone Encounter (Signed)
I'm afraid I can't do that. Lori Miles did not treat her for an infectious problem or even an asthma flare.  Her instructions were to use saline rinses, zyrtec, and stop picking her nose (literally, see her instructions).  As I have never actually examined this lady and this diagnosis does not seem very serious I don't think that this justifies missing school.  You can read this note to mom if needed.

## 2015-06-01 NOTE — Telephone Encounter (Signed)
New message ° ° ° ° ° °Calling to get echo results °

## 2015-06-01 NOTE — Telephone Encounter (Signed)
Spoke with TP, OK to give school note for the next 2 days

## 2015-06-01 NOTE — Telephone Encounter (Signed)
Spoke with pt's mother-pt's mother is upset that pt cannot get a sick note for school.  [name] states that pt is still coughing up mucus tinged with blood and that pt is not picking her nose, but picking AT her nose.  [name] states she does not want to send pt back to school until she is feeling better in an attempt to keep pt from catching something else.  [name] asking BQ to reconsider school note for pt.    BQ please advise.  Thanks.

## 2015-06-01 NOTE — Telephone Encounter (Signed)
Refill sent for Albuterol neb

## 2015-06-01 NOTE — Telephone Encounter (Signed)
Called spoke with pt mother and made aware of below. Made aware word for word below. Nothing further needed

## 2015-06-01 NOTE — Telephone Encounter (Signed)
This encounter was created in error - please disregard.

## 2015-06-01 NOTE — Progress Notes (Signed)
Subjective:    Patient ID: Lori Miles, female    DOB: 09/24/1995,     MRN: [id number]  HPI  19 yobf special ed pt ? Autistic/ never smoker followed by Dr Willa RoughHicks / Kozlow referred to pulmonary clinic 03/15/2015 s/p admit   Admit date: 02/14/2015 Discharge date: 02/15/2015     Recommendations for Outpatient Follow-up:  Note given for school   Discharge Diagnoses:  Principal Problem:  CAP (community acquired pneumonia) Active Problems:  Seizure disorder (HCC)  Asthma  Chronic constipation  Interstitial cystitis  Obesity (BMI 30-39.9)  Mental retardation, 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)  Autism spectrum disorder  Acute respiratory failure (HCC)  Nausea & vomiting   Discharge Condition: improved  Diet recommendation: regular  Filed Weights   02/14/15 1901  Weight: 78.699 kg (173 lb 8 oz)    History of present illness:  This is a 20 year old female patient without Autism spectrum disorder/developmental delay, chronic interstitial cystitis, chronic constipation, seizure disorder, and asthma. Patient's mother's the primary historian and reports for about 3 weeks patient has had increased cough. Patient does have chronic wheezing and occasional coughing but she has been progressively worsening and now has green productive sputum. She was evaluated as an outpatient by primary care physician and started on antibiotics and steroids and nebs without any improvement in her symptoms. The patient has been experiencing nausea and vomiting specially after coughing episode. Because of persistent symptomatology and patient inability to keep medications down her mother brought her to the ER for further evaluation.  In the ER the patient had low-grade fever 99.3, BP was stable at 114/73 pulse was 98 and regular respirations 20. Room air saturations were 98%. Chest x-ray completed on 11/28 revealed new airspace opacification in the right lower lobe worrisome for pneumonia. In the  ER today the patient was given duo neb along with a liter of fluid and Zofran with improvement in her wheezing but patient continued to have issues with nausea after eating although she has not yet vomited since attempting solid food earlier today. Chest x-ray concerning for aspiration component especially with history of chronic wheezing and recurrent cough and what sounds like symptoms consistent with vocal cord dysfunction.  Hospital Course:  [facility] -steroid taper -finish course of abx -much improved -no aspiration issue      03/15/2015 1st [facility] office visit/ Wert  On pulmicort 0.5 qid and prn saba hasn't used latter x 24 h Chief Complaint  Patient presents with  . Pulmonary Consult    Hospital discharge stating pt had pneumonia. Mother states breathing problems since August 2016. Mother states pt increased SOB, wheezing, and "gasping for air" at night   had been on chronic prednisone x months but apparently no more pred since early December 2016 (mother an nurse  held up bottle of doxy confused with pred, and singulair confused with gerd rx) - noct wheeze no better on saba per mother. When pt is asked if she's trouble breathing she nods know, when asked where she has pain she point to her neck (middle to upper) and mother does report severe dry coughing fits esp supine  The mother, the main caretaker, failed  to answer a single question asked in a straightforward manner, tending to go off on tangents or answer questions with ambiguous medical terms or diagnoses and seemed aggravated to the point of anger when asked the same question more than once for clarification.   As far as I could tell through the mother >>  No obvious  patterns in day to day or daytime variabilty or assoc excess/ purulent sputum or mucus plugs  or cp or chest tightness, subjective wheeze overt sinus or hb symptoms. No unusual exp hx or h/o childhood pna/ asthma or knowledge of premature birth.  Also  denies any obvious fluctuation of symptoms with weather or environmental changes or other aggravating or alleviating factors except as outlined above    05/29/15  Acute OV : Nasal congestion  Pt presents with mother and family .  She was recently seen last month for progressive dyspena.  She has been having trouble for several months with ongoing cough, sob , decreased activity level. Mom says her normal activity is she plays and runs at school but for several months does not want to do anything but sit around . She will walk for a distance then just sit down in floor to rest.  She is special needs with Cerebral palsy and autism with seizure disorder. She is followed by neuro on depakote.  Walk test in office today with no desats but HR goes up to 140 with walking.   She was set up for a CT chest (D dimer was elevated ) that was neg for PE . No acute process .  BNP was normal  She was referred to cardiology for tachycardia. She has been set up for an Echo.   Complains of ongoing  sinus congestion bloody nasal discharge. , prod cough with blood mucus, SOB and wheezing starting on 05/28/15. Denies any chest congestion/tightness, fever, nausea or vomiting.  Seen in ER yesterday with normal CXR.  Started on MalintaOmnicef and encouraged to use saline nasal rinses. Mom says she keeps picking at her nose. Has bloody nasal discharge and mucus with blood mixed in it in her mouth as well. Pt does not understand to not pick at nose even though mom tells her to not do that.      Denies any chest tightness, sinus congestion, fever, nausea or vomiting.    Current Medications, Allergies, Complete Past Medical History, Past Surgical History, Family History, and Social History were reviewed in [facility] electronic medical record.          .    Review of Systems  Constitutional: Negative for fever, chills and unexpected weight change.  HENT: Negative for congestion, dental problem, ear pain, nosebleeds,  postnasal drip, rhinorrhea, sinus pressure, sneezing, sore throat, trouble swallowing and voice change.   Eyes: Negative for visual disturbance.  Respiratory: Positive for shortness of breath Negative for cough and choking.   Cardiovascular: Negative for chest pain and leg swelling.  Gastrointestinal: Negative for vomiting, abdominal pain and diarrhea.  Genitourinary: Negative for difficulty urinating.  Musculoskeletal: Negative for arthralgias.  Skin: Negative for rash.  Neurological: Negative for tremors, syncope and headaches.  Hematological: Does not bruise/bleed easily.       Objective:   Physical Exam  amb young obese , responses w/ simple reply  Filed Vitals:   05/29/15 1438  BP: 112/72  Pulse: 104  Temp: 98.2 F (36.8 C)  TempSrc: Oral  Height: 4\' 9"  (1.448 m)  Weight: 192 lb (87.091 kg)  SpO2: 100%    Vital signs reviewed    HEENT: nl dentition, turbinates, and oropharynx. Nl external ear canals without cough reflex, nasal mucosa with crusted mucus, no active bleeding noted.  Class 2-3 MP airway   NECK :  without JVD/Nodes/TM/ nl carotid upstrokes bilaterally   LUNGS: no acc muscle use,  Nl contour chest which is clear to A and P bilaterally without cough on insp or exp maneuvers   CV:  RRR  no s3 or murmur or increase in P2, no edema   ABD:  soft and nontender with nl inspiratory excursion in the supine position. No bruits or organomegaly, bowel sounds nl  MS:  Nl gait/ ext warm without deformities, calf tenderness, cyanosis or clubbing No obvious joint restrictions   SKIN: warm and dry without lesions    NEURO:  alert, approp, nl sensorium with  no motor deficits     Matika Bartell NP-C  [facility]  05/29/15      CXR:  02/26/15  No acute abnormality noted.          Assessment & Plan:

## 2015-06-01 NOTE — Telephone Encounter (Signed)
Called spoke with pt Mother.She is requesting a letter for pt to be out of school today and tomorrow. She reports she is still coughing a lot. TP NA this week. Pt saw TP on 05/29/15. Please advise Dr. Kendrick FriesMcQuaid if okay to give letter? Mother is requesting this ASAP. thanks

## 2015-06-02 NOTE — Assessment & Plan Note (Signed)
Ongoing dyspnea ? Etiology -may be multifactoral with obesity , deconditioning.   CT chest neg for PE .  Cont tx for rhinitis /bronchitis .  Follow up with cards for echo as directed.   Plan  Saline nasal rinses and gel as needed No picking at nose.  Continue on Zyrtec daily  Follow up with Dr. Kendrick FriesMcQuaid as planned and.As needed   Please contact office for sooner follow up if symptoms do not improve or worsen or seek emergency care

## 2015-06-02 NOTE — Assessment & Plan Note (Signed)
Recent flare with rhinitis  Finish abx as directed Add saline nasal rinses As needed    Plan  Saline nasal rinses and gel as needed No picking at nose.  Continue on Zyrtec daily  Follow up with Dr. Kendrick FriesMcQuaid as planned and.As needed   Please contact office for sooner follow up if symptoms do not improve or worsen or seek emergency care

## 2015-06-05 ENCOUNTER — Other Ambulatory Visit: Payer: Self-pay | Admitting: Certified Nurse Midwife

## 2015-06-07 ENCOUNTER — Other Ambulatory Visit: Payer: Self-pay

## 2015-06-07 NOTE — Telephone Encounter (Signed)
Forms are in Dr. Willa RoughHicks office

## 2015-06-07 NOTE — Telephone Encounter (Signed)
Mom is calling to see if we did the prior auth for the Albuterol.   Pt uses [facility] on [address].  Please Advise   Pt last seen 03/23/2015 by Dr. Willa RoughHicks

## 2015-06-09 ENCOUNTER — Telehealth: Payer: Self-pay

## 2015-06-09 DIAGNOSIS — G40309 Generalized idiopathic epilepsy and epileptic syndromes, not intractable, without status epilepticus: Secondary | ICD-10-CM

## 2015-06-09 DIAGNOSIS — F819 Developmental disorder of scholastic skills, unspecified: Secondary | ICD-10-CM

## 2015-06-09 DIAGNOSIS — G40209 Localization-related (focal) (partial) symptomatic epilepsy and epileptic syndromes with complex partial seizures, not intractable, without status epilepticus: Secondary | ICD-10-CM

## 2015-06-09 NOTE — Telephone Encounter (Signed)
"  Lori Miles", mom lvm stating that PT has suggested a bedside commode and a transfer tub bench.  Mom is asking for Rx's for the items. Mom  will come to the office to retrieve the Rx's once they have been written.  She also mentioned that Medicaid covered the cost of the wheelchair and child has been using it. CB# [phone]

## 2015-06-12 ENCOUNTER — Telehealth: Payer: Self-pay | Admitting: *Deleted

## 2015-06-12 ENCOUNTER — Ambulatory Visit: Payer: Medicare Other | Admitting: Internal Medicine

## 2015-06-12 DIAGNOSIS — R000 Tachycardia, unspecified: Secondary | ICD-10-CM

## 2015-06-12 NOTE — Telephone Encounter (Signed)
Spoke to mom and informed that the form has been sent to the pharmacy.

## 2015-06-12 NOTE — Telephone Encounter (Signed)
Called mom and le there know that the Rx's were placed at the front desk for the bedside commode and shower transfer chair. She said that she would be in tomorrow to pick it up. I let her know our office hours, closed at lunch. She thanked me for reminding her.

## 2015-06-12 NOTE — Telephone Encounter (Signed)
Please let Mom know that the order is at the front desk. Thanks, [name]

## 2015-06-12 NOTE — Telephone Encounter (Signed)
Spoke with patient's mother about 48 hour monitor to determine average heart rate. (per [facility] OV note - if echo negative, may benefit from 48 holter) She is agreeable and understands office will call her to arrange this.

## 2015-06-14 ENCOUNTER — Telehealth: Payer: Self-pay | Admitting: Cardiology

## 2015-06-14 DIAGNOSIS — R569 Unspecified convulsions: Secondary | ICD-10-CM | POA: Diagnosis not present

## 2015-06-14 DIAGNOSIS — J4520 Mild intermittent asthma, uncomplicated: Secondary | ICD-10-CM | POA: Diagnosis not present

## 2015-06-14 DIAGNOSIS — R600 Localized edema: Secondary | ICD-10-CM

## 2015-06-14 DIAGNOSIS — F840 Autistic disorder: Secondary | ICD-10-CM | POA: Diagnosis not present

## 2015-06-14 NOTE — Telephone Encounter (Signed)
Calling c/o of dtr's leg swollen.  States it is getting worse and PCP won't do anything about it and saw him today. Increased Lasix is not helping.  Patient's mother would like Dr. Gershon Craneamnitz's advice on this matter.  She would like to know if he would recommend her to see someone who can help. Should she go see a vascular doctor?  Should it be drained? (these are her questions)  She would like advisement. She is aware I am out of the office tomorrow and it may be Friday before calling her back with recommendations.   [facility]ing [facility] to advise on.

## 2015-06-14 NOTE — Telephone Encounter (Signed)
New Message  Pt requested to speak w/ rN- Please call back and discuss.   

## 2015-06-16 ENCOUNTER — Encounter: Payer: Self-pay | Admitting: Pulmonary Disease

## 2015-06-16 ENCOUNTER — Ambulatory Visit: Payer: Medicare Other | Admitting: Pulmonary Disease

## 2015-06-16 ENCOUNTER — Telehealth: Payer: Self-pay | Admitting: Pulmonary Disease

## 2015-06-16 VITALS — BP 124/68 | HR 113 | Ht <= 58 in | Wt 196.0 lb

## 2015-06-16 DIAGNOSIS — J45991 Cough variant asthma: Secondary | ICD-10-CM

## 2015-06-16 NOTE — Telephone Encounter (Signed)
Checked in to see how patient is doing since we spoke on Wednesday -  mom reports pitting BLEE.  States pt c/o that both legs hurt.  Discussed with Dr Elberta Fortisamnitz this afternoon. Advised that this is not heart related but we can order LE study to evaluate for venous insufficiency. She is so appreciative of the help and understands office will call to arrange this test.

## 2015-06-16 NOTE — Assessment & Plan Note (Addendum)
Lori Miles's mother describes multiple recurrent episodes of coughing up significant amounts of mucus which is green and sometimes bloody. This occurs she says on nearly a daily basis and occurs at night.  She tells me that her daughter has "a chronic lung disease" which is the cause of her problem.  However, today on exam Lori CellaJasmine is not ill-appearing at all her lungs are completely clear, she is breathing comfortably, and her oxygenation is normal. So while her mother feels that she currently has an acute illness I disagree based on her physical exam. Further, I see no evidence of an underlying chronic lung disease. She has been told in the past that she has asthma, but I have no objective evidence for this in that all spirometry test which are available to me (there are multiple) has shown no evidence of airflow obstruction. Today we attempted to perform an exhaled nitric oxide test but her mother insisted that the daughter could not perform this. When we explained that she should be able to because it's a very simple test, in fact simpler than spirometry testing, the mother refused.  When I explained to her mother that I saw very little evidence of any lung disease much less a chronic lung disease she flatly denied this. I explained to her and actually showed her the images from the CT angiogram of her chest from a few months ago which showed no evidence of an underlying lung disease. She disagrees with me and says that the patient has a lung problem.  I explained that I'm concerned about the possibility of regurgitation of food based on the history of the sudden onset of thick mucus and a history of aspiration pneumonia 1 year ago. I explained that I thought it would be reasonable to perform a barium swallow to evaluate her esophagus. Her mother is insistent that she's had this test even though I explained to her that the only test I can see most recently was a modified barium swallow. Her mother argues  with me extensively that she has had a barium swallow recently even though the only one I can find is from the year 2000.  At the end of our visit, her mother requested a bottle of codeine cough syrup, "the big one". She also demanded antibiotics but I explained to her that I saw no indication for narcotics or antibiotics but that I wanted to evaluate her daughter for esophageal dysmotility or stricture with a barium swallow. I also tried once again to encourage the mother to let us perform exhaled nitric oxide testing, but she once again refused.  The patient and her mother were in the clinic for over an hour. For most of this time the mother was arguing with us over our assessment and demanding antibiotics and narcotics. I explained that the patient needed neither of those. I also explained that I think she needs to find another pulmonary clinic as she has seen 3 of our physicians here and she disagrees with our assessments that there is no significant lung disease. Further, the mother's demanding nature and what appears to me to be narcotic seeking behavior make it unlikely that we will be able to enter into a good therapeutic relationship with her at this clinic any further.  > 50 minutes spent, > 50% face to face

## 2015-06-16 NOTE — Patient Instructions (Signed)
I see no indication for an antibiotic or narcotic today  I believe that she needs further testing to prove a diagnosis of asthma, we will attempt an exhaled nitric oxide today We will try to track down the results of the barium swallow Follow-up 4-6 weeks

## 2015-06-16 NOTE — Progress Notes (Signed)
Subjective:    Patient ID: Lori Miles, female    DOB: [birth year], 20 y.o.   MRN: [id number]  Synopsis: History of asthma in the setting of underlying autism. She has been evaluated by asthma and allergy in the past. She has also seen to pulmonologist at our group.  HPI Chief Complaint  Patient presents with   Follow-up    pt still has runny nose, wheezing, prod cough with mucus sometimes tinged with mucus- cough going until pt vomits.     Lori Miles has bene struggling with cough. She will have a lot of chest congestion, sometimes green to bloody, it can come up her nose from time to time. She will have periods of this that are more severe. Her mother says that antibiotics will help. She has had pneumonia in the past, last was around November.  She was admitted for the same. Her heart rate has been elevated as well.  Apparently she has asthma but with regurgitation lately she has been having more asthma lately.  Still takes pulmicort bid Still takes albuterol every four hours.  Past Medical History  Diagnosis Date   CP (cerebral palsy) (HCC)     mom denies   Asthma    Mental deficiency     hx of aggressive behavior,impaired speech    Constipation    Allergy    Autism     mom states no actual dx, but admits she has some symptoms   Frequency-urgency syndrome      some incontinence ; wears pull-ups   Seizure (HCC)     most recent sz " at the end of summer"   Interstitial cystitis    Bronchitis       Review of Systems     Objective:   Physical Exam Filed Vitals:   06/16/15 1436  BP: 124/68  Pulse: 113  Height:  (1.448 m)  Weight: 196 lb (88.905 kg)  SpO2: 99%   RA  Gen: well appearing female HENT: OP clear, TM's clear, neck supple PULM: CTA B, normal percussion CV: RRR, no mgr, trace edema GI: BS+, soft, nontender Derm: no cyanosis or rash Psyche: normal mood and affect Neuro: playing on iPAD        Assessment & Plan:  Cough  variant asthma vs UACS/ vcd  Lori Miles's mother describes multiple recurrent episodes of coughing up significant amounts of mucus which is green and sometimes bloody. This occurs she says on nearly a daily basis and occurs at night.  She tells me that her daughter has "a chronic lung disease" which is the cause of her problem.  However, today on exam Lori Miles is not ill-appearing at all her lungs are completely clear, she is breathing comfortably, and her oxygenation is normal. So while her mother feels that she currently has an acute illness I disagree based on her physical exam. Further, I see no evidence of an underlying chronic lung disease. She has been told in the past that she has asthma, but I have no objective evidence for this in that all spirometry test which are available to me (there are multiple) has shown no evidence of airflow obstruction. Today we attempted to perform an exhaled nitric oxide test but her mother insisted that the daughter could not perform this. When we explained that she should be able to because it's a very simple test, in fact simpler than spirometry testing, the mother refused.  When I explained to her mother that I saw very little evidence  of any lung disease much less a chronic lung disease she flatly denied this. I explained to her and actually showed her the images from the CT angiogram of her chest from a few months ago which showed no evidence of an underlying lung disease. She disagrees with me and says that the patient has a lung problem.  I explained that I'm concerned about the possibility of regurgitation of food based on the history of the sudden onset of thick mucus and a history of aspiration pneumonia 1 year ago. I explained that I thought it would be reasonable to perform a barium swallow to evaluate her esophagus. Her mother is insistent that she's had this test even though I explained to her that the only test I can see most recently was a modified barium  swallow. Her mother argues with me extensively that she has had a barium swallow recently even though the only one I can find is from the year 2000.  At the end of our visit, her mother requested a bottle of codeine cough syrup, "the big one". She also demanded antibiotics but I explained to her that I saw no indication for narcotics or antibiotics but that I wanted to evaluate her daughter for esophageal dysmotility or stricture with a barium swallow. I also tried once again to encourage the mother to let us perform exhaled nitric oxide testing, but she once again refused.  The patient and her mother were in the clinic for over an hour. For most of this time the mother was arguing with us over our assessment and demanding antibiotics and narcotics. I explained that the patient needed neither of those. I also explained that I think she needs to find another pulmonary clinic as she has seen 3 of our physicians here and she disagrees with our assessments that there is no significant lung disease. Further, the mother's demanding nature and what appears to me to be narcotic seeking behavior make it unlikely that we will be able to enter into a good therapeutic relationship with her at this clinic any further.  > 50 minutes spent, > 50% face to face      Current outpatient prescriptions:    albuterol (PROVENTIL) (2.5 MG/3ML) 0.083% nebulizer solution, Take 3 mLs (2.5 mg total) by nebulization every 4 (four) hours as needed for wheezing or shortness of breath (wheezing)., Disp: 180 mL, Rfl: 1   budesonide (PULMICORT) 0.5 MG/2ML nebulizer solution, Take 2 mLs (0.5 mg total) by nebulization 2 (two) times daily. One vial twice daily, Disp: 180 mL, Rfl: 4   cetirizine (ZYRTEC) 10 MG tablet, Take 1 tablet (10 mg total) by mouth daily., Disp: 30 tablet, Rfl: 4   cloNIDine (CATAPRES) 0.1 MG tablet, Take 1 tab by mouth at bedtime. (Patient taking differently: Take 0.15 mg by mouth at bedtime. Take 1 tab by  mouth at bedtime.), Disp: 31 tablet, Rfl: 5   Cranberry 500 MG CAPS, Take 500 mg by mouth daily., Disp: , Rfl:    DEPAKOTE 500 MG DR tablet, Take 1 tablet twice per day, Disp: 60 tablet, Rfl: 5   fluconazole (DIFLUCAN) 100 MG tablet, take 1 tablet by mouth once daily as needed for yeast infection, Disp: 2 tablet, Rfl: 4   fluconazole (DIFLUCAN) 100 MG tablet, take 1 tablet by mouth once daily REPEAT IN 48-72 HOURS, Disp: 2 tablet, Rfl: 4   hyoscyamine (LEVSIN SL) 0.125 MG SL tablet, DISSOLVE 1 TABLET UNDER THE TONGUE 2 TIMES A DAY, Disp: , Rfl:  0   Levonorgestrel-Ethinyl Estradiol (SEASONIQUE) 0.15-0.03 &0.01 MG tablet, Take 1 tablet by mouth daily. Take 1 active tablet daily, skip non active pills., Disp: 1 Package, Rfl: 11   Melatonin 3 MG TABS, Take 3 mg by mouth at bedtime. For insomnia, Disp: , Rfl:    mometasone (NASONEX) 50 MCG/ACT nasal spray, One-Two sprays each in each nostril, Disp: 17 g, Rfl: 5   montelukast (SINGULAIR) 10 MG tablet, Take 1 tablet (10 mg total) by mouth at bedtime., Disp: 30 tablet, Rfl: 3   Multiple Vitamin (MULTIVITAMIN WITH MINERALS) TABS tablet, Take 1 tablet by mouth daily., Disp: , Rfl:    nitrofurantoin, macrocrystal-monohydrate, (MACROBID) 100 MG capsule, Take 1 capsule (100 mg total) by mouth 2 (two) times daily., Disp: 10 capsule, Rfl: 0   nystatin-triamcinolone (MYCOLOG II) cream, Apply 1 application topically daily as needed. Reported on 05/29/2015, Disp: , Rfl: 0   Olopatadine HCl 0.6 % SOLN, Use one spray twice daily for stuffy nose or drainage., Disp: 1 Bottle, Rfl: 5   ondansetron (ZOFRAN ODT) 8 MG disintegrating tablet, Take 1 tablet (8 mg total) by mouth every 8 (eight) hours as needed for nausea or vomiting., Disp: 10 tablet, Rfl: 0   promethazine-codeine (PHENERGAN WITH CODEINE) 6.25-10 MG/5ML syrup, Take 5 mLs by mouth every 4 (four) hours as needed for cough., Disp: 473 mL, Rfl: 0   senna (SENOKOT) 8.6 MG tablet, Take 1 tablet by mouth  daily., Disp: , Rfl:    torsemide (DEMADEX) 10 MG tablet, Take 10 mg by mouth daily., Disp: , Rfl:    traZODone (DESYREL) 50 MG tablet, Take 2 tabs by mouth at bedtime (Patient taking differently: Take 100 mg by mouth at bedtime. Take 2 tabs by mouth at bedtime), Disp: 62 tablet, Rfl: 1   trimethoprim (TRIMPEX) 100 MG tablet, Take 100 mg by mouth every morning. Reported on 05/29/2015, Disp: , Rfl:    vitamin C (ASCORBIC ACID) 500 MG tablet, Take 500 mg by mouth daily., Disp: , Rfl:

## 2015-06-19 NOTE — Telephone Encounter (Signed)
[name] came in on Friday, who spoke with team lead and assistant director to handle her several questions.  Nothing further needed.

## 2015-06-23 ENCOUNTER — Telehealth: Payer: Self-pay | Admitting: Pulmonary Disease

## 2015-06-23 NOTE — Telephone Encounter (Signed)
Patients mom calling back to switch back to Dr. Sherene SiresWert because she says that Dr. Kendrick FriesMcQuaid would not give her the cough medication for [facility] and she needs the cough medication.  She says that patient needs refill on Cough syrup and would like to know if Dr. Sherene SiresWert will refill it for her.  Dr. Sherene SiresWert, please advise if okay to switch and if ok for cough med? Dr. Kendrick FriesMcQuaid, please advise if okay to switch back to Dr. Sherene SiresWert?

## 2015-06-23 NOTE — Telephone Encounter (Signed)
Can't rx with codeine, best option is delsym Strongly rec refer to [facility] ent Dr Delford FieldWright who specializes in this type of problem   Since she switched from me I do not feel comfortable accepting her back because she did not accept my recs the first time around and they were the best I had to offer

## 2015-06-23 NOTE — Telephone Encounter (Signed)
Called, spoke with pt's mother.  As she answered the phone, she (the mother) apologized for sounding "different" stating she was "in pain and on the way to the hospital."  I apologized for mother being in pain and discussed below per Dr. Sherene SiresWert.  High recommended [facility] referral for pt.  Mother states "this is not an option.  Conception can't get on the highway, or she will have a seizure.  We almost lost her because of it before."  Advised BQ and MW feel [facility] referral is [facility].  Mother still declining this referral and states she would like Rayen to see Dr. Isaiah SergeMannam "because [name] has seen him.  I didn't have a problem with him, but I couldn't understand him."  Advised of office protocol on switching MDs and that both, BQ and PM, would have to approve of the switch.  Mother then states she is fine with "staying with Dr. Kendrick FriesMcQuaid if he listens" and wanted to ensure [name] P, NP would be available to see Marshia if [name] continued to see Dr. Kendrick FriesMcQuaid.  Explained to mother TP is only available to see pts when the provider's schedule is full and/or not available and pt's must have a primary pulmonologist in the office.  Advised I would need to discuss further with Dr. Kendrick FriesMcQuaid and would call her back.  Pt verbalized understanding.  Will discuss with BQ when he returns to office on 07/03/15.

## 2015-07-01 ENCOUNTER — Encounter (HOSPITAL_COMMUNITY): Payer: Self-pay | Admitting: *Deleted

## 2015-07-01 ENCOUNTER — Ambulatory Visit (HOSPITAL_COMMUNITY)
Admission: EM | Admit: 2015-07-01 | Discharge: 2015-07-01 | Disposition: A | Discharge status: Home / Self Care | Payer: Medicare Other | Attending: Emergency Medicine | Admitting: Emergency Medicine

## 2015-07-01 DIAGNOSIS — J0181 Other acute recurrent sinusitis: Secondary | ICD-10-CM

## 2015-07-01 HISTORY — DX: Pneumonia, unspecified organism: J18.9

## 2015-07-01 MED ORDER — PREDNISONE 20 MG PO TABS: 20.0000 mg | ORAL_TABLET | Freq: Every day | ORAL | Status: DC

## 2015-07-01 MED ORDER — CEFDINIR 300 MG PO CAPS: 300.0000 mg | ORAL_CAPSULE | Freq: Two times a day (BID) | ORAL | Status: DC

## 2015-07-01 NOTE — ED Notes (Signed)
Mother reports pt having had 3 courses of abx in past 5 months for sinus infections.  Started again with sxs 2 wks ago - cough at night, coughing up large amts thick sputum at night, pt picking at nose, nasal congestion.  Was most recently on cefdinir.

## 2015-07-01 NOTE — ED Provider Notes (Signed)
CSN: [id number]     Arrival date & time 07/01/15  1647 History   First MD Initiated Contact with Patient 07/01/15 1811     Chief Complaint  Patient presents with  . Cough   (Consider location/radiation/quality/duration/timing/severity/associated sxs/prior Treatment) HPI  She is a 20 year old woman here with her parents for evaluation of cough and nasal congestion. History is obtained from mom. For the last 5 months she has been having recurring sinus infections. 2 days ago she developed recurrent nasal congestion, cough, postnasal drainage, and feeling poorly. Mom denies known fevers. Mom states Truman HaywardOmnicef seemed to work well last time.  Past Medical History  Diagnosis Date  . CP (cerebral palsy) (HCC)     mom denies  . Asthma   . Mental deficiency     hx of aggressive behavior,impaired speech   . Constipation   . Allergy   . Autism     mom states no actual dx, but admits she has some symptoms  . Frequency-urgency syndrome      some incontinence ; wears pull-ups  . Seizure (HCC)     most recent sz " at the end of summer"  . Interstitial cystitis   . Bronchitis   . Pneumonia    Past Surgical History  Procedure Laterality Date  . Cystoscopy    . Cystoscopy with hydrodistension and biopsy  04/14/2012    Procedure: CYSTOSCOPY/BIOPSY/HYDRODISTENSION;  Surgeon: Lindaann SloughMarc-Henry Nesi, MD;  [facility] [place];  Service: Urology;;  instillation of marcaine and pyridium   Family History  Problem Relation Age of Onset  . Seizures Mother   . Seizures Sister     1 sister has   Social History  Substance Use Topics  . Smoking status: Never Smoker   . Smokeless tobacco: Never Used  . Alcohol Use: No   OB History    No data available     Review of Systems As in history of present illness Allergies  Abilify; Seroquel; Amoxicillin-pot clavulanate; and Keppra  Home Medications   Prior to Admission medications   Medication Sig Start Date End Date Taking? Authorizing  Provider  albuterol (PROVENTIL) (2.5 MG/3ML) 0.083% nebulizer solution Take 3 mLs (2.5 mg total) by nebulization every 4 (four) hours as needed for wheezing or shortness of breath (wheezing). 06/01/15  Yes Roselyn Kara MeadM Hicks, MD  budesonide (PULMICORT) 0.5 MG/2ML nebulizer solution Take 2 mLs (0.5 mg total) by nebulization 2 (two) times daily. One vial twice daily 03/28/15  Yes Roselyn Kara MeadM Hicks, MD  cetirizine (ZYRTEC) 10 MG tablet Take 1 tablet (10 mg total) by mouth daily. 03/30/15  Yes Roselyn Kara MeadM Hicks, MD  cloNIDine (CATAPRES) 0.1 MG tablet Take 1 tab by mouth at bedtime. Patient taking differently: Take 0.15 mg by mouth at bedtime. Take 1 tab by mouth at bedtime. 11/16/14  Yes Deetta PerlaWilliam H Hickling, MD  Cranberry 500 MG CAPS Take 500 mg by mouth daily.   Yes Historical Provider, MD  DEPAKOTE 500 MG DR tablet Take 1 tablet twice per day 05/02/15  Yes Elveria Risingina Goodpasture, NP  hyoscyamine (LEVSIN SL) 0.125 MG SL tablet DISSOLVE 1 TABLET UNDER THE TONGUE 2 TIMES A DAY 08/08/14  Yes Historical Provider, MD  Levonorgestrel-Ethinyl Estradiol (SEASONIQUE) 0.15-0.03 &0.01 MG tablet Take 1 tablet by mouth daily. Take 1 active tablet daily, skip non active pills. 10/27/13  Yes Antionette CharLisa Jackson-Moore, MD  Melatonin 3 MG TABS Take 3 mg by mouth at bedtime. For insomnia   Yes Historical Provider, MD  montelukast (SINGULAIR)  10 MG tablet Take 1 tablet (10 mg total) by mouth at bedtime. 05/03/15  Yes Roselyn Kara Mead, MD  Multiple Vitamin (MULTIVITAMIN WITH MINERALS) TABS tablet Take 1 tablet by mouth daily.   Yes Historical Provider, MD  Olopatadine HCl 0.6 % SOLN Use one spray twice daily for stuffy nose or drainage. 03/08/15  Yes Roselyn Kara Mead, MD  ondansetron (ZOFRAN ODT) 8 MG disintegrating tablet Take 1 tablet (8 mg total) by mouth every 8 (eight) hours as needed for nausea or vomiting. 02/15/15  Yes Joseph Art, DO  promethazine-codeine (PHENERGAN WITH CODEINE) 6.25-10 MG/5ML syrup Take 5 mLs by mouth every 4 (four) hours  as needed for cough. 03/15/15  Yes Nyoka Cowden, MD  senna (SENOKOT) 8.6 MG tablet Take 1 tablet by mouth daily.   Yes Historical Provider, MD  torsemide (DEMADEX) 10 MG tablet Take 10 mg by mouth daily.   Yes Historical Provider, MD  traZODone (DESYREL) 50 MG tablet Take 2 tabs by mouth at bedtime Patient taking differently: Take 100 mg by mouth at bedtime. Take 2 tabs by mouth at bedtime 03/01/15  Yes Elveria Rising, NP  trimethoprim (TRIMPEX) 100 MG tablet Take 100 mg by mouth every morning. Reported on 05/29/2015   Yes Historical Provider, MD  vitamin C (ASCORBIC ACID) 500 MG tablet Take 500 mg by mouth daily.   Yes Historical Provider, MD  cefdinir (OMNICEF) 300 MG capsule Take 1 capsule (300 mg total) by mouth 2 (two) times daily. 07/01/15   Charm Rings, MD  nystatin-triamcinolone (MYCOLOG II) cream Apply 1 application topically daily as needed. Reported on 05/29/2015 02/10/15   Historical Provider, MD  predniSONE (DELTASONE) 20 MG tablet Take 1 tablet (20 mg total) by mouth daily. 07/01/15   Charm Rings, MD   Meds Ordered and Administered this Visit  Medications - No data to display  BP 108/61 mmHg  Pulse 104  Temp(Src) 99.1 F (37.3 C) (Oral)  SpO2 97% No data found.   Physical Exam  Constitutional: She appears well-developed and well-nourished. No distress.  HENT:  Mouth/Throat: No oropharyngeal exudate.  Oropharynx is erythematous with clear postnasal drainage. TMs normal bilaterally. Nasal mucosa is somewhat inflamed.  Neck: Neck supple.  Cardiovascular: Normal rate, regular rhythm and normal heart sounds.   No murmur heard. Pulmonary/Chest: Effort normal and breath sounds normal. No respiratory distress. She has no wheezes. She has no rales.  Lymphadenopathy:    She has no cervical adenopathy.    ED Course  Procedures (including critical care time)  Labs Review Labs Reviewed - No data to display  Imaging Review No results found.   MDM   1. Other acute  recurrent sinusitis    We'll treat with Omnicef and prednisone. Follow-up as needed.    Charm Rings, MD 07/01/15 [phone]

## 2015-07-01 NOTE — Discharge Instructions (Signed)
She has recurrent sinusitis. Give her Omnicef twice a day for 10 days. Give her prednisone daily for 5 days. Follow-up as needed.

## 2015-07-02 NOTE — Telephone Encounter (Signed)
No, the problem here is not [name], it is her mother.  I see no evidence of a lung disease here.  However the mother argues with the doctor for the entire visit and demands narcotics.  In our last visit I explained to the mother face to face that she needs to find another pulmonologist if she continues to believe that [name] has a lung problem.  Further, I explained that she was exhibiting drug seeking behavior and I would NOT fill the prescription with narcotic.    So I'm not seeing her again.  I [facility] if she wants a second (really fourth) opinion.

## 2015-07-04 ENCOUNTER — Encounter: Payer: Self-pay | Admitting: *Deleted

## 2015-07-04 NOTE — Telephone Encounter (Signed)
Per Dr. Kendrick FriesMcQuaid, pt to be discharged [facility].  Discharge letter completed and sent to Medical Records per protocol.

## 2015-07-05 ENCOUNTER — Encounter (HOSPITAL_COMMUNITY): Payer: Self-pay

## 2015-07-05 ENCOUNTER — Ambulatory Visit (HOSPITAL_COMMUNITY)
Admission: RE | Admit: 2015-07-05 | Discharge: 2015-07-05 | Disposition: A | Discharge status: Home / Self Care | Payer: Medicare Other | Source: Ambulatory Visit | Attending: Internal Medicine | Admitting: Internal Medicine

## 2015-07-05 ENCOUNTER — Telehealth: Payer: Self-pay | Admitting: Pulmonary Disease

## 2015-07-05 DIAGNOSIS — R600 Localized edema: Secondary | ICD-10-CM | POA: Diagnosis not present

## 2015-07-05 NOTE — Telephone Encounter (Signed)
Patient dismissed from [facility] by Lupita Leashouglas B McQuaid MD , effective July 04, 2015. Dismissal letter sent out by certified / registered mail. DAJ  Received signed domestic return receipt verifying delivery of certified letter on July 10, 2015. Article number [id number] DAJ

## 2015-07-05 NOTE — Telephone Encounter (Signed)
Pt can be seen by Rubye Oaksammy Parrett, NP tomorrow to f/u from Urgent Care Visit on 07/01/15.  Appt scheduled - mom aware.

## 2015-07-06 ENCOUNTER — Ambulatory Visit (INDEPENDENT_AMBULATORY_CARE_PROVIDER_SITE_OTHER): Payer: Medicare Other | Admitting: Adult Health

## 2015-07-06 ENCOUNTER — Encounter: Payer: Self-pay | Admitting: Adult Health

## 2015-07-06 ENCOUNTER — Other Ambulatory Visit: Payer: Self-pay | Admitting: Family

## 2015-07-06 VITALS — BP 124/62 | HR 102 | Temp 98.1°F | Ht <= 58 in | Wt 189.0 lb

## 2015-07-06 DIAGNOSIS — J329 Chronic sinusitis, unspecified: Secondary | ICD-10-CM

## 2015-07-06 DIAGNOSIS — G4700 Insomnia, unspecified: Secondary | ICD-10-CM

## 2015-07-06 DIAGNOSIS — J45991 Cough variant asthma: Secondary | ICD-10-CM | POA: Diagnosis not present

## 2015-07-06 MED ORDER — TRAZODONE HCL 50 MG PO TABS: ORAL_TABLET | ORAL | Status: DC

## 2015-07-06 NOTE — Progress Notes (Signed)
Subjective:    Patient ID: Lori Miles, female    DOB: 11-Jan-1996,     MRN: [id number]  HPI 19 yobf special ed pt ? Autistic/ never smoker followed by Dr Willa Rough / Kozlow referred to pulmonary clinic 03/15/2015 s/p admit for CAP 01/2015 .    03/15/2015 1st [facility] office visit/ Wert  On pulmicort 0.5 qid and prn saba hasn't used latter x 24 h Chief Complaint  Patient presents with  . Pulmonary Consult    Hospital discharge stating pt had pneumonia. Mother states breathing problems since August 2016. Mother states pt increased SOB, wheezing, and "gasping for air" at night   had been on chronic prednisone x months but apparently no more pred since early December 2016 (mother an nurse  held up bottle of doxy confused with pred, and singulair confused with gerd rx) - noct wheeze no better on saba per mother. When pt is asked if she's trouble breathing she nods know, when asked where she has pain she point to her neck (middle to upper) and mother does report severe dry coughing fits esp supine  The mother, the main caretaker, failed  to answer a single question asked in a straightforward manner, tending to go off on tangents or answer questions with ambiguous medical terms or diagnoses and seemed aggravated to the point of anger when asked the same question more than once for clarification.   As far as I could tell through the mother >> No obvious  patterns in day to day or daytime variabilty or assoc excess/ purulent sputum or mucus plugs  or cp or chest tightness, subjective wheeze overt sinus or hb symptoms. No unusual exp hx or h/o childhood pna/ asthma or knowledge of premature birth.  Also denies any obvious fluctuation of symptoms with weather or environmental changes or other aggravating or alleviating factors except as outlined above    07/06/15  Follow up : Cough and wheezing   Pt presents with mother and family .  She was recently seen last month for progressive dyspena.    She has been having trouble for several months with ongoing cough, sob , decreased activity level. Mom says her normal activity is she plays and runs at school but for several months does not want to do anything but sit around . She will walk for a distance then just sit down in floor to rest.  She is special needs with Cerebral palsy and autism with seizure disorder. She is followed by neuro on depakote.  Walk test in office today with no desats but HR goes up to 140 with walking.   She was set up for a CT chest (D dimer was elevated ) that was neg for PE . No acute process .  BNP was normal  She was referred to cardiology for tachycardia. She has been set up for an Echo.  This was normal with EF 55-60%. trival TV regurg.   Seen in ER 05/28/15 for nose bleeding , recommended to have saline nasal rinses.  She returns to our pulmonary clinic on 06/16/15 with recommendations for spirometry and feno testing but was unable to conmplete, ? Reason why as she has done spirometry in past.  Went back to ER on 07/01/15 for nasal congestioin , rx Omnicef for 10 days  She returns today with mother and step dad. She is some better but still has ongoing nasal symptoms, cough and intermittent wheezing. Discussed with mother that taking too many antibiotics and steroids  is not a good idea if not indicated as many side effects. Her weight has increased a lot over last year.  Complains that nose feels stopped up . Gets winded and has cough intermittently .  Denies chest pain, orthopnea ,edema or fever.    Current Medications, Allergies, Complete Past Medical History, Past Surgical History, Family History, and Social History were reviewed in [facility] electronic medical record.     Review of Systems  Constitutional: Negative for fever, chills and unexpected weight change.  HENT: Negative for trouble swallowing and voice change.   Eyes: Negative for visual disturbance.  Respiratory: Positive for shortness  of breath Negative for cough and choking.   Cardiovascular: Negative for chest pain and leg swelling.  Gastrointestinal: Negative for vomiting, abdominal pain and diarrhea.  Genitourinary: Negative for difficulty urinating.  Musculoskeletal: Negative for arthralgias.  Skin: Negative for rash.  Neurological: Negative for tremors, syncope and headaches.  Hematological: Does not bruise/bleed easily.       Objective:   Physical Exam  amb young obese , responses w/ simple reply  Filed Vitals:   07/06/15 1426  BP: 124/62  Pulse: 102  Temp: 98.1 F (36.7 C)  TempSrc: Oral  Height: 4\' 9"  (1.448 m)  Weight: 189 lb (85.73 kg)  SpO2: 97%    Vital signs reviewed    HEENT: nl dentition, turbinates, and oropharynx. Nl external ear canals without cough reflex, nasal mucosa with crusted mucus, no active bleeding noted.  Class 2-3 MP airway   NECK :  without JVD/Nodes/TM/ nl carotid upstrokes bilaterally   LUNGS: no acc muscle use,  Nl contour chest which is clear to A and P bilaterally without cough on insp or exp maneuvers   CV:  RRR  no s3 or murmur or increase in P2, no edema   ABD:  soft and nontender with nl inspiratory excursion in the supine position. No bruits or organomegaly, bowel sounds nl  MS:  Nl gait/ ext warm without deformities, calf tenderness, cyanosis or clubbing No obvious joint restrictions   SKIN: warm and dry without lesions    NEURO:  alert, approp, nl sensorium with  no motor deficits     [name] Parrett NP-C  [facility]  05/29/15      CXR:  02/26/15  No acute abnormality noted.          Assessment & Plan:

## 2015-07-06 NOTE — Patient Instructions (Addendum)
Foot LockerFinish Omnicef as directed.  Check CT sinus .  Continue on Singulair .  Saline nasal rinses and gel as needed Continue on Zyrtec daily  Please contact office for sooner follow up if symptoms do not improve or worsen or seek emergency care

## 2015-07-07 ENCOUNTER — Encounter: Payer: Self-pay | Admitting: Internal Medicine

## 2015-07-07 ENCOUNTER — Ambulatory Visit (INDEPENDENT_AMBULATORY_CARE_PROVIDER_SITE_OTHER): Payer: Medicare Other

## 2015-07-07 ENCOUNTER — Ambulatory Visit (INDEPENDENT_AMBULATORY_CARE_PROVIDER_SITE_OTHER): Payer: Medicare Other | Admitting: Internal Medicine

## 2015-07-07 ENCOUNTER — Encounter: Payer: Self-pay | Admitting: *Deleted

## 2015-07-07 ENCOUNTER — Ambulatory Visit (INDEPENDENT_AMBULATORY_CARE_PROVIDER_SITE_OTHER)
Admission: RE | Admit: 2015-07-07 | Discharge: 2015-07-07 | Disposition: A | Discharge status: Home / Self Care | Payer: Medicare Other | Source: Ambulatory Visit | Attending: Adult Health | Admitting: Adult Health

## 2015-07-07 VITALS — BP 100/62 | HR 92 | Ht <= 58 in | Wt 189.0 lb

## 2015-07-07 DIAGNOSIS — K5900 Constipation, unspecified: Secondary | ICD-10-CM | POA: Diagnosis not present

## 2015-07-07 DIAGNOSIS — J329 Chronic sinusitis, unspecified: Secondary | ICD-10-CM | POA: Diagnosis not present

## 2015-07-07 DIAGNOSIS — K219 Gastro-esophageal reflux disease without esophagitis: Secondary | ICD-10-CM

## 2015-07-07 DIAGNOSIS — K5909 Other constipation: Secondary | ICD-10-CM

## 2015-07-07 DIAGNOSIS — R000 Tachycardia, unspecified: Secondary | ICD-10-CM | POA: Diagnosis not present

## 2015-07-07 DIAGNOSIS — R05 Cough: Secondary | ICD-10-CM | POA: Diagnosis not present

## 2015-07-07 DIAGNOSIS — R059 Cough, unspecified: Secondary | ICD-10-CM

## 2015-07-07 NOTE — Progress Notes (Signed)
Patient ID: Lori SchickJasmine Kashuba, female   DOB: Nov 18, 1995, 20 y.o.   MRN: [id number] HPI: Lori Miles is a 20 year old female with seizure disorder, possible cerebral palsy, autism, chronic constipation, interstitial cystitis, obesity who is seen to evaluate coughing and wheezing possibly related to reflux and also constipation. She's here today with her mother. The patient her mother presented an hour and 45 minutes early for her appointment and when I entered the room lights were off and the patient was sleeping soundly on the examining table. She did awake to answer questions but would return to sleep and was snoring.  Her mother states that she is "handicapped" and needs tremendous help in functioning on a day-to-day basis. Her mother has been worried about asthma and gagging which she has been having a different times mainly in the day. She feels that this contributes to wheezing and feels convinced that she has lung disease. This is despite eating seen by him several pulmonologist recently who found no objective evidence of lung disease. She reports that the patient denies heartburn though she has been tried on pantoprazole and Pepcid. They stop pantoprazole because it "hurt her stomach". Pepcid was tolerated but they noticed no change in symptoms. She's had chronic constipation and she is using MiraLAX 17 g twice daily and senna. She's previously been seen by Dr. Chestine Sporelark with pediatric gastroenterology. They deny blood in her stool or melena. She's been gaining weight and her mother feels that this is secondary to increasing Depakote doses. She does occasionally have nausea for which she will occasionally use promethazine or Zofran. Ivannia denies dysphagia or odynophagia. Denies early satiety. Her mother reports normal menstrual cycles monthly for her daughter.   Past Medical History  Diagnosis Date  . CP (cerebral palsy) (HCC)     mom denies  . Asthma   . Mental deficiency     hx of aggressive  behavior,impaired speech   . Constipation   . Allergy   . Autism     mom states no actual dx, but admits she has some symptoms  . Frequency-urgency syndrome      some incontinence ; wears pull-ups  . Seizure (HCC)     most recent sz " at the end of summer"  . Interstitial cystitis   . Bronchitis   . Pneumonia     Past Surgical History  Procedure Laterality Date  . Cystoscopy    . Cystoscopy with hydrodistension and biopsy  04/14/2012    Procedure: CYSTOSCOPY/BIOPSY/HYDRODISTENSION;  Surgeon: Lindaann SloughMarc-Henry Nesi, MD;  [facility];  Service: Urology;;  instillation of marcaine and pyridium    Outpatient Prescriptions Prior to Visit  Medication Sig Dispense Refill  . albuterol (PROVENTIL) (2.5 MG/3ML) 0.083% nebulizer solution Take 3 mLs (2.5 mg total) by nebulization every 4 (four) hours as needed for wheezing or shortness of breath (wheezing). 180 mL 1  . budesonide (PULMICORT) 0.5 MG/2ML nebulizer solution Take 2 mLs (0.5 mg total) by nebulization 2 (two) times daily. One vial twice daily 180 mL 4  . cefdinir (OMNICEF) 300 MG capsule Take 1 capsule (300 mg total) by mouth 2 (two) times daily. 20 capsule 0  . cetirizine (ZYRTEC) 10 MG tablet Take 1 tablet (10 mg total) by mouth daily. 30 tablet 4  . cloNIDine (CATAPRES) 0.1 MG tablet Take 1 tab by mouth at bedtime. (Patient taking differently: Take 0.15 mg by mouth at bedtime. Take 1 tab by mouth at bedtime.) 31 tablet 5  . Cranberry 500 MG  CAPS Take 500 mg by mouth daily.    Marland Kitchen DEPAKOTE 500 MG DR tablet Take 1 tablet twice per day 60 tablet 5  . hyoscyamine (LEVSIN SL) 0.125 MG SL tablet DISSOLVE 1 TABLET UNDER THE TONGUE 2 TIMES A DAY  0  . Levonorgestrel-Ethinyl Estradiol (SEASONIQUE) 0.15-0.03 &0.01 MG tablet Take 1 tablet by mouth daily. Take 1 active tablet daily, skip non active pills. 1 Package 11  . Melatonin 3 MG TABS Take 3 mg by mouth at bedtime. For insomnia    . montelukast (SINGULAIR) 10 MG tablet  Take 1 tablet (10 mg total) by mouth at bedtime. 30 tablet 3  . Multiple Vitamin (MULTIVITAMIN WITH MINERALS) TABS tablet Take 1 tablet by mouth daily.    Marland Kitchen nystatin-triamcinolone (MYCOLOG II) cream Apply 1 application topically daily as needed. Reported on 05/29/2015  0  . Olopatadine HCl 0.6 % SOLN Use one spray twice daily for stuffy nose or drainage. 1 Bottle 5  . ondansetron (ZOFRAN ODT) 8 MG disintegrating tablet Take 1 tablet (8 mg total) by mouth every 8 (eight) hours as needed for nausea or vomiting. 10 tablet 0  . promethazine-codeine (PHENERGAN WITH CODEINE) 6.25-10 MG/5ML syrup Take 5 mLs by mouth every 4 (four) hours as needed for cough. 473 mL 0  . senna (SENOKOT) 8.6 MG tablet Take 1 tablet by mouth daily.    Marland Kitchen torsemide (DEMADEX) 10 MG tablet Take 10 mg by mouth daily.    . traZODone (DESYREL) 50 MG tablet Take 2 tabs by mouth at bedtime 62 tablet 3  . trimethoprim (TRIMPEX) 100 MG tablet Take 100 mg by mouth every morning. Reported on 05/29/2015    . vitamin C (ASCORBIC ACID) 500 MG tablet Take 500 mg by mouth daily.     No facility-administered medications prior to visit.    Allergies  Allergen Reactions  . Abilify [Aripiprazole] Swelling and Palpitations  . Seroquel [Quetiapine Fumarate] Palpitations  . Amoxicillin-Pot Clavulanate Hives  . Keppra [Levetiracetam] Other (See Comments)    Aggressive behavior     Family History  Problem Relation Age of Onset  . Seizures Mother   . Seizures Sister     1 sister has    Social History  Substance Use Topics  . Smoking status: Never Smoker   . Smokeless tobacco: Never Used  . Alcohol Use: No    ROS: As per history of present illness, otherwise negative  BP 100/62 mmHg  Pulse 92  Ht  (1.448 m)  Wt 189 lb (85.73 kg)  BMI 40.89 kg/m2 Constitutional: Well-developed and well-nourished. No distress. HEENT: Normocephalic and atraumatic. Oropharynx is clear and moist. No oropharyngeal exudate. Conjunctivae are  normal.  No scleral icterus. Neck: Neck supple. Trachea midline. Cardiovascular: Normal rate, regular rhythm and intact distal pulses. No M/R/G Pulmonary/chest: Effort normal and breath sounds normal. No wheezing, rales or rhonchi. Abdominal: Soft, obese, nontender, nondistended. Bowel sounds active throughout. There are no masses palpable.  Extremities: no clubbing, cyanosis, or edema Lymphadenopathy: No cervical adenopathy noted. Neurological: Alert and oriented to person place and time. Skin: Skin is warm and dry. No rashes noted. Psychiatric: Normal mood and affect. Behavior is normal.  RELEVANT LABS AND IMAGING: CBC    Component Value Date/Time   WBC 8.5 05/28/2015 1113   RBC 4.04 05/28/2015 1113   HGB 11.8* 05/28/2015 1113   HCT 36.7 05/28/2015 1113   PLT 318 05/28/2015 1113   MCV 90.8 05/28/2015 1113   MCH 29.2 05/28/2015 1113  MCHC 32.2 05/28/2015 1113   RDW 14.1 05/28/2015 1113   LYMPHSABS 3.2 04/08/2015 1210   MONOABS 1.2* 04/08/2015 1210   EOSABS 0.0 04/08/2015 1210   BASOSABS 0.0 04/08/2015 1210    CMP     Component Value Date/Time   NA 140 05/28/2015 1113   K 3.9 05/28/2015 1113   CL 105 05/28/2015 1113   CO2 25 05/28/2015 1113   GLUCOSE 129* 05/28/2015 1113   BUN 5* 05/28/2015 1113   CREATININE 0.91 05/28/2015 1113   CALCIUM 8.9 05/28/2015 1113   PROT 6.3* 05/28/2015 1113   ALBUMIN 3.1* 05/28/2015 1113   AST 21 05/28/2015 1113   ALT 14 05/28/2015 1113   ALKPHOS 45 05/28/2015 1113   BILITOT 0.5 05/28/2015 1113   GFRNONAA >60 05/28/2015 1113   GFRAA >60 05/28/2015 1113    ASSESSMENT/PLAN: 20 year old female with seizure disorder, possible cerebral palsy, autism, chronic constipation, interstitial cystitis, obesity who is seen to evaluate coughing and wheezing possibly related to reflux and also constipation.  1. Intermittent cough/wheezing -- possibly a manifestation of reflux disease. We discussed GERD diet and GERD lifestyle. Her recent weight gain  could also be contributing to reflux disease. We will perform an objective study to evaluate esophageal motility and for reflux with barium swallow with upper GI. This was also previously discussed and recommended by Dr. Kendrick Fries with pulmonary  2. Chronic constipation -- high-fiber diet recommended. She can continue MiraLAX 17 g twice daily.  Senna on an as-needed basis    ZO:XWRUE [place] [address] [place], [place] [address]

## 2015-07-07 NOTE — Patient Instructions (Signed)
Please continue Miralax twice daily.  Continue Senna.  You have been scheduled for a Barium Esophogram and upper gi series at [facility] (1st floor of the hospital) on Tuesday, 07/11/15 at 9:00 am. Please arrive to admitting 15 minutes prior to your appointment for registration. Make certain not to have anything to eat or drink 6 hours prior to your test. If you need to reschedule for any reason, please contact radiology at [phone] to do so. __________________________________________________________________ A barium swallow is an examination that concentrates on views of the esophagus. This tends to be a double contrast exam (barium and two liquids which, when combined, create a gas to distend the wall of the oesophagus) or single contrast (non-ionic iodine based). The study is usually tailored to your symptoms so a good history is essential. Attention is paid during the study to the form, structure and configuration of the esophagus, looking for functional disorders (such as aspiration, dysphagia, achalasia, motility and reflux) EXAMINATION You may be asked to change into a gown, depending on the type of swallow being performed. A radiologist and radiographer will perform the procedure. The radiologist will advise you of the type of contrast selected for your procedure and direct you during the exam. You will be asked to stand, sit or lie in several different positions and to hold a small amount of fluid in your mouth before being asked to swallow while the imaging is performed .In some instances you may be asked to swallow barium coated marshmallows to assess the motility of a solid food bolus. The exam can be recorded as a digital or video fluoroscopy procedure. POST PROCEDURE It will take 1-2 days for the barium to pass through your system. To facilitate this, it is important, unless otherwise directed, to increase your fluids for the next 24-48hrs and to resume your normal diet.  This  test typically takes about 30 minutes to perform. __________________________________________________________________________________ If you are age 21 or older, your body mass index should be between 23-30. Your Body mass index is 40.89 kg/(m^2). If this is out of the aforementioned range listed, please consider follow up with your Primary Care Provider.  If you are age 21 or younger, your body mass index should be between 19-25. Your Body mass index is 40.89 kg/(m^2). If this is out of the aformentioned range listed, please consider follow up with your Primary Care Provider.

## 2015-07-10 ENCOUNTER — Telehealth: Payer: Self-pay

## 2015-07-10 ENCOUNTER — Telehealth: Payer: Self-pay | Admitting: Cardiology

## 2015-07-10 ENCOUNTER — Telehealth: Payer: Self-pay | Admitting: Adult Health

## 2015-07-10 DIAGNOSIS — J339 Nasal polyp, unspecified: Secondary | ICD-10-CM

## 2015-07-10 DIAGNOSIS — G4700 Insomnia, unspecified: Secondary | ICD-10-CM

## 2015-07-10 MED ORDER — CLONIDINE HCL 0.1 MG PO TABS: ORAL_TABLET | ORAL | Status: DC

## 2015-07-10 NOTE — Telephone Encounter (Signed)
She wanted us to know that she would return monitor this afternoon.   Patient pulled off leads on Saturday and would not keep them on.  So there will me limited data. Informed mother that I will inform Dr. Elberta Fortisamnitz and once he reviews what information was obtained we will call her with results/advisement. Patient's mother verbalized understanding and agreeable to plan.

## 2015-07-10 NOTE — Telephone Encounter (Signed)
I called and talked to Atalya's mother. I apologized for the problems she had with refills and [facility]. TG

## 2015-07-10 NOTE — Telephone Encounter (Signed)
Per TP after reviewing sinus CT CT shows no infection Refer to ENT for questionable nasal polyp  Called spoke with pt's mother. Informed her of results and recs. [name] voiced understanding and had no further questions. Referral placed. Nothing further needed.

## 2015-07-10 NOTE — Telephone Encounter (Signed)
[phone] mother calling back

## 2015-07-10 NOTE — Telephone Encounter (Signed)
Mom lvm stating that child[facility] and should have been sent [facility]. She said that [facility] clon[facility], however, it was sent for 1 tab po and she is taking 1.5 tabs. Mom picked up the clonidine Rx, but is going to run out before refill date. CB# [phone].

## 2015-07-10 NOTE — Telephone Encounter (Signed)
New Message  Pt mother will be returning heart monitor today- 4/24. Pt mother has additional questions for RN. Please call back and discuss.

## 2015-07-11 ENCOUNTER — Ambulatory Visit (HOSPITAL_COMMUNITY)
Admission: RE | Admit: 2015-07-11 | Discharge: 2015-07-11 | Disposition: A | Discharge status: Home / Self Care | Payer: Medicare Other | Source: Ambulatory Visit | Attending: Internal Medicine | Admitting: Internal Medicine

## 2015-07-11 ENCOUNTER — Other Ambulatory Visit: Payer: Self-pay | Admitting: Internal Medicine

## 2015-07-11 ENCOUNTER — Telehealth: Payer: Self-pay | Admitting: Adult Health

## 2015-07-11 ENCOUNTER — Telehealth: Payer: Self-pay | Admitting: Pulmonary Disease

## 2015-07-11 DIAGNOSIS — J0141 Acute recurrent pansinusitis: Secondary | ICD-10-CM | POA: Insufficient documentation

## 2015-07-11 DIAGNOSIS — K219 Gastro-esophageal reflux disease without esophagitis: Secondary | ICD-10-CM | POA: Insufficient documentation

## 2015-07-11 DIAGNOSIS — K224 Dyskinesia of esophagus: Secondary | ICD-10-CM | POA: Insufficient documentation

## 2015-07-11 DIAGNOSIS — R1310 Dysphagia, unspecified: Secondary | ICD-10-CM | POA: Diagnosis not present

## 2015-07-11 DIAGNOSIS — K449 Diaphragmatic hernia without obstruction or gangrene: Secondary | ICD-10-CM | POA: Diagnosis not present

## 2015-07-11 DIAGNOSIS — J343 Hypertrophy of nasal turbinates: Secondary | ICD-10-CM | POA: Diagnosis not present

## 2015-07-11 DIAGNOSIS — J3489 Other specified disorders of nose and nasal sinuses: Secondary | ICD-10-CM | POA: Diagnosis not present

## 2015-07-11 DIAGNOSIS — J302 Other seasonal allergic rhinitis: Secondary | ICD-10-CM | POA: Diagnosis not present

## 2015-07-11 NOTE — Telephone Encounter (Signed)
Called spoke with pt's mother. I informed her of the below. I asked her to clarify who Dr. Kathrynn RunningManning is. She states that he is within our office. I verified that she meant PM. She states [facility] and is requesting a call from Philipp Deputyammy Davis to discuss why her daughter is being discharged. She is requesting to see PM again instead of BQ. She explained the only reason she wanted to switch to BQ is because she was unable to communicate well with PM. She insists that her daughter has to see a pulmonologist due to her breathing issues. I explained to her that the letter was a dismissal letter from our practice. She states that she does not agree and would like to speak to BQ's superior. I explained that I would make Philipp Deputyammy Davis aware. She voiced understanding and had no further questions.

## 2015-07-11 NOTE — Assessment & Plan Note (Signed)
Ongoing symptoms with AR and cough ? Cough variant asthma +/- AR trigger Finish abx , hold on additional abx . No steroids at this time.   Check CT sinus   Plan  [facility].  Check CT sinus .  Continue on Singulair .  Saline nasal rinses and gel as needed Continue on Zyrtec daily  Please contact office for sooner follow up if symptoms do not improve or worsen or seek emergency care

## 2015-07-11 NOTE — Telephone Encounter (Signed)
I apologize what was I to speak to Dr. Kathrynn RunningManning about .  Lori Miles is feeling better.

## 2015-07-11 NOTE — Telephone Encounter (Signed)
Called spoke with pt's mother. She states that she was seen by Dr. Annalee GentaShoemaker today. She wanted to make sure that the visit information was sent to our office for review. I explained to her that we did receive the information and that I would place it in TP's look at. I explained to that I would talk to Tribune [employer]ammy Davis tomorrow when she returns to office about the information we discussed in the previous phone message from 07/11/15. She voiced understanding and had no further questions. Nothing further needed at this time.

## 2015-07-11 NOTE — Telephone Encounter (Signed)
Spoke with pt's mother. States that she needs to know if TP talked to Dr. Kathrynn RunningManning about pt. She also brought up that she received the certified letter from our office. States that the letter doesn't state that the pt was dismissed and that she is going to get a Clinical research associatelawyer and fight this. "You guys can't do this to her just because Dr. Kendrick FriesMcQuaid doesn't know how to listen."  TP - please advise if you spoke to Dr. Kathrynn RunningManning. Thanks.

## 2015-07-12 ENCOUNTER — Other Ambulatory Visit: Payer: Self-pay

## 2015-07-13 NOTE — Telephone Encounter (Signed)
Spoke with Mellody DanceKeith and Philipp Deputyammy Davis to make them aware of situation. Nothing further needed at this time.

## 2015-07-14 ENCOUNTER — Telehealth: Payer: Self-pay | Admitting: Internal Medicine

## 2015-07-14 NOTE — Telephone Encounter (Signed)
Pt scheduled for EM and mother received the instructions in the mail. States she will have to be sedated because there is no way she will be still for this. Please advise.

## 2015-07-14 NOTE — Telephone Encounter (Signed)
This procedure requires participation and is used to eval esophagus function One is required to be awake for this test. If she cannot tolerate it, it will have to be canceled

## 2015-07-17 NOTE — Telephone Encounter (Signed)
Spoke with pts mother and she is aware. They will leave appt as scheduled.

## 2015-07-25 ENCOUNTER — Telehealth: Payer: Self-pay | Admitting: Internal Medicine

## 2015-07-25 NOTE — Telephone Encounter (Signed)
EGD could be offered given delayed passage of barium tablet to exclude stricture, but motility cannot be eval'ed by EGD

## 2015-07-25 NOTE — Telephone Encounter (Signed)
Mother calling back and states she really does not think pt will be able to complete EM since she will not be sedated. She would like to know if there are any other tests or procedures Dr. Rhea BeltonPyrtle would like to order instead. Please advise.

## 2015-07-26 NOTE — Telephone Encounter (Signed)
Yes, will ZO:XWRUEcc:Nulty to ensure he agrees

## 2015-07-26 NOTE — Telephone Encounter (Signed)
EM cancelled. Pts mother would like for her to have EGD done. Dr. Rhea BeltonPyrtle do you think pt can be done in the [facility]? Please advise.

## 2015-07-27 NOTE — Telephone Encounter (Signed)
Pt scheduled for previsit 08/08/15@2pm , pts mother will come for this appt. Pt scheduled for EGD in the [facility] 08/15/15@2pm . Pts mother aware of appts.

## 2015-07-27 NOTE — Telephone Encounter (Signed)
Dr. Rhea BeltonPyrtle,  This pt is cleared for [facility] at [facility].  Best regards,  Cathlyn ParsonsJohn Bauer Ausborn

## 2015-07-31 ENCOUNTER — Encounter (HOSPITAL_COMMUNITY): Admission: RE | Payer: Self-pay | Source: Ambulatory Visit

## 2015-07-31 ENCOUNTER — Ambulatory Visit (HOSPITAL_COMMUNITY): Admission: RE | Admit: 2015-07-31 | Payer: Medicare Other | Source: Ambulatory Visit | Admitting: Internal Medicine

## 2015-07-31 SURGERY — MANOMETRY, ESOPHAGUS
Anesthesia: LOCAL

## 2015-08-08 ENCOUNTER — Ambulatory Visit (AMBULATORY_SURGERY_CENTER): Payer: Self-pay | Admitting: *Deleted

## 2015-08-08 VITALS — Ht <= 58 in | Wt 185.0 lb

## 2015-08-08 DIAGNOSIS — R05 Cough: Secondary | ICD-10-CM

## 2015-08-08 DIAGNOSIS — K219 Gastro-esophageal reflux disease without esophagitis: Secondary | ICD-10-CM

## 2015-08-08 DIAGNOSIS — R059 Cough, unspecified: Secondary | ICD-10-CM

## 2015-08-08 NOTE — Progress Notes (Signed)
No egg or soy allergy known to patient  No issues with past sedation with any surgeries  or procedures, no intubation problems  No diet pills per patient No home 02 use per patient  No blood thinners per patient  Pt has  issues with constipation - takes senna bedtime and uses miralax bid Mother in [place] today and received instructions for pt.

## 2015-08-09 ENCOUNTER — Encounter: Payer: Medicare Other | Admitting: Internal Medicine

## 2015-08-11 DIAGNOSIS — J4520 Mild intermittent asthma, uncomplicated: Secondary | ICD-10-CM | POA: Diagnosis not present

## 2015-08-11 DIAGNOSIS — Z6839 Body mass index (BMI) 39.0-39.9, adult: Secondary | ICD-10-CM | POA: Diagnosis not present

## 2015-08-11 DIAGNOSIS — R600 Localized edema: Secondary | ICD-10-CM | POA: Diagnosis not present

## 2015-08-15 ENCOUNTER — Ambulatory Visit (AMBULATORY_SURGERY_CENTER): Payer: Medicare Other | Admitting: Internal Medicine

## 2015-08-15 ENCOUNTER — Encounter: Payer: Self-pay | Admitting: Internal Medicine

## 2015-08-15 VITALS — BP 115/78 | HR 63 | Temp 97.4°F | Resp 16 | Ht 59.5 in | Wt 185.0 lb

## 2015-08-15 DIAGNOSIS — K2970 Gastritis, unspecified, without bleeding: Secondary | ICD-10-CM | POA: Diagnosis not present

## 2015-08-15 DIAGNOSIS — K2990 Gastroduodenitis, unspecified, without bleeding: Secondary | ICD-10-CM

## 2015-08-15 DIAGNOSIS — K2950 Unspecified chronic gastritis without bleeding: Secondary | ICD-10-CM | POA: Diagnosis not present

## 2015-08-15 DIAGNOSIS — I10 Essential (primary) hypertension: Secondary | ICD-10-CM | POA: Diagnosis not present

## 2015-08-15 DIAGNOSIS — F329 Major depressive disorder, single episode, unspecified: Secondary | ICD-10-CM | POA: Diagnosis not present

## 2015-08-15 DIAGNOSIS — R05 Cough: Secondary | ICD-10-CM | POA: Diagnosis not present

## 2015-08-15 DIAGNOSIS — K219 Gastro-esophageal reflux disease without esophagitis: Secondary | ICD-10-CM

## 2015-08-15 DIAGNOSIS — F840 Autistic disorder: Secondary | ICD-10-CM | POA: Diagnosis not present

## 2015-08-15 DIAGNOSIS — J45909 Unspecified asthma, uncomplicated: Secondary | ICD-10-CM | POA: Diagnosis not present

## 2015-08-15 DIAGNOSIS — R059 Cough, unspecified: Secondary | ICD-10-CM

## 2015-08-15 DIAGNOSIS — R569 Unspecified convulsions: Secondary | ICD-10-CM | POA: Diagnosis not present

## 2015-08-15 MED ORDER — SODIUM CHLORIDE 0.9 % IV SOLN: 500.0000 mL | INTRAVENOUS | Status: DC

## 2015-08-15 MED ORDER — RANITIDINE HCL 150 MG PO TABS: ORAL_TABLET | ORAL | Status: DC

## 2015-08-15 NOTE — Op Note (Signed)
[facility] Patient Name: Lori Miles Procedure Date: 08/15/2015 1:14 PM MRN: [id number] Endoscopist: Beverley Fiedler , MD Age: 20 Referring MD:  Date of Birth: 07/23/1995 Gender: Female Procedure:                Upper GI endoscopy Indications:              Chronic cough, intermittent trouble swallowing,                            suspicion for GERD/LPR with esophageal dysmotility,                            abnormal barium esophagram Medicines:                Monitored Anesthesia Care Procedure:                Pre-Anesthesia Assessment:                           - Prior to the procedure, a History and Physical                            was performed, and patient medications and                            allergies were reviewed. The patient's tolerance of                            previous anesthesia was also reviewed. The risks                            and benefits of the procedure and the sedation                            options and risks were discussed with the patient.                            All questions were answered, and informed consent                            was obtained. Prior Anticoagulants: The patient has                            taken no previous anticoagulant or antiplatelet                            agents. ASA Grade Assessment: II - A patient with                            mild systemic disease. After reviewing the risks                            and benefits, the patient was deemed in  satisfactory condition to undergo the procedure.                           After obtaining informed consent, the endoscope was                            passed under direct vision. Throughout the                            procedure, the patient's blood pressure, pulse, and                            oxygen saturations were monitored continuously. The                            Model GIF-[id number]) scope was introduced                             through the mouth, and advanced to the second part                            of duodenum. The upper GI endoscopy was                            accomplished without difficulty. The patient                            tolerated the procedure well. Scope In: Scope Out: Findings:                 The examined esophagus was normal. Multiple                            biopsies were obtained in the middle third of the                            esophagus and in the lower third of the esophagus                            with cold forceps for evaluation of eosinophilic                            esophagitis.                           The Z-line was regular and was found 35 cm from the                            incisors.                           Localized mild inflammation characterized by                            erythema was found in the prepyloric region of the  stomach. Biopsies were taken with a cold forceps                            for histology and Helicobacter pylori testing.                           The exam of the stomach was otherwise normal.                           The cardia and gastric fundus were normal on                            retroflexion.                           The examined duodenum was normal. Complications:            No immediate complications. Estimated Blood Loss:     Estimated blood loss: none. Impression:               - Normal esophagus. Biopsied.                           - Gastritis. Biopsied.                           - Normal examined duodenum. Recommendation:           - Patient has a contact number available for                            emergencies. The signs and symptoms of potential                            delayed complications were discussed with the                            patient. Return to normal activities tomorrow.                            Written discharge instructions were  provided to the                            patient.                           - Resume previous diet.                           - Continue present medications.                           - Await pathology results.                           - Given esophageal dysmotility and LPR symptoms,  trial of Zantac (ranitidine) 150 mg PO twice daily                            (morning and evening). Beverley Fiedler, MD 08/15/2015 1:33:00 PM This report has been signed electronically.

## 2015-08-15 NOTE — Progress Notes (Signed)
Report to PACU, RN, vss, BBS= Clear.  

## 2015-08-15 NOTE — Patient Instructions (Signed)
YOU HAD AN ENDOSCOPIC PROCEDURE TODAY AT THE [facility]:   Refer to the procedure report that was given to you for any specific questions about what was found during the examination.  If the procedure report does not answer your questions, please call your gastroenterologist to clarify.  If you requested that your care partner not be given the details of your procedure findings, then the procedure report has been included in a sealed envelope for you to review at your convenience later.  YOU SHOULD EXPECT: Some feelings of bloating in the abdomen. Passage of more gas than usual.  Walking can help get rid of the air that was put into your GI tract during the procedure and reduce the bloating. If you had a lower endoscopy (such as a colonoscopy or flexible sigmoidoscopy) you may notice spotting of blood in your stool or on the toilet paper. If you underwent a bowel prep for your procedure, you may not have a normal bowel movement for a few days.  Please Note:  You might notice some irritation and congestion in your nose or some drainage.  This is from the oxygen used during your procedure.  There is no need for concern and it should clear up in a day or so.  SYMPTOMS TO REPORT IMMEDIATELY:    Following upper endoscopy (EGD)  Vomiting of blood or coffee ground material  New chest pain or pain under the shoulder blades  Painful or persistently difficult swallowing  New shortness of breath  Fever of 100F or higher  Black, tarry-looking stools  For urgent or emergent issues, a gastroenterologist can be reached at any hour by calling [phone].   DIET: Your first meal following the procedure should be a small meal and then it is ok to progress to your normal diet. Heavy or fried foods are harder to digest and may make you feel nauseous or bloated.  Likewise, meals heavy in dairy and vegetables can increase bloating.  Drink plenty of fluids but you should avoid alcoholic beverages  for 24 hours.  ACTIVITY:  You should plan to take it easy for the rest of today and you should NOT DRIVE or use heavy machinery until tomorrow (because of the sedation medicines used during the test).    FOLLOW UP: Our staff will call the number listed on your records the next business day following your procedure to check on you and address any questions or concerns that you may have regarding the information given to you following your procedure. If we do not reach you, we will leave a message.  However, if you are feeling well and you are not experiencing any problems, there is no need to return our call.  We will assume that you have returned to your regular daily activities without incident.  If any biopsies were taken you will be contacted by phone or by letter within the next 1-3 weeks.  Please call us at [phone] if you have not heard about the biopsies in 3 weeks.    SIGNATURES/CONFIDENTIALITY: You and/or your care partner have signed paperwork which will be entered into your electronic medical record.  These signatures attest to the fact that that the information above on your After Visit Summary has been reviewed and is understood.  Full responsibility of the confidentiality of this discharge information lies with you and/or your care-partner. 

## 2015-08-15 NOTE — Progress Notes (Signed)
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  They were asking if they could ask him for his autograph.  He is currently in the bay next to the pt in admitting.  I explained that with HIPAA laws, I could not allow the pt's parents to ask for the other's patients autograph while he was waiting for his procedure.

## 2015-08-15 NOTE — Progress Notes (Signed)
Mother taking a photo by her cell phone of  Patient sedated and unresponsive at that time.instructed mother to put away her phone.

## 2015-08-15 NOTE — Progress Notes (Signed)
Called to room to assist during endoscopic procedure.  Patient ID and intended procedure confirmed with present staff. Received instructions for my participation in the procedure from the performing physician.  

## 2015-08-16 ENCOUNTER — Telehealth: Payer: Self-pay | Admitting: Internal Medicine

## 2015-08-16 ENCOUNTER — Telehealth: Payer: Self-pay | Admitting: *Deleted

## 2015-08-16 ENCOUNTER — Telehealth: Payer: Self-pay | Admitting: Allergy and Immunology

## 2015-08-16 ENCOUNTER — Encounter: Payer: Self-pay | Admitting: Internal Medicine

## 2015-08-16 NOTE — Telephone Encounter (Signed)
  Follow up Call-  Call back number 08/15/2015  Post procedure Call Back phone  # [phone]  Permission to leave phone message Yes     Patient questions:  Do you have a fever, pain , or abdominal swelling? No. Pain Score  0 *  Have you tolerated food without any problems? Yes.    Have you been able to return to your normal activities? Yes.    Do you have any questions about your discharge instructions: Diet   No. Medications  No. Follow up visit  No.  Do you have questions or concerns about your Care? No.  Actions: * If pain score is 4 or above: No action needed, pain <4.

## 2015-08-16 NOTE — Telephone Encounter (Signed)
Please call mom

## 2015-08-16 NOTE — Telephone Encounter (Signed)
Error

## 2015-08-16 NOTE — Telephone Encounter (Signed)
Mom called and wants to talk with a nurse about her daughter. [phone].

## 2015-08-16 NOTE — Telephone Encounter (Signed)
Pts mother wants to make sure Dr. Rhea BeltonPyrtle got records from Dr. Chestine Sporelark and Dr. Levie HeritagePendse. Explained it may be difficult to get the records from Dr. Levie HeritagePendse as he has been retired for many years.

## 2015-08-18 ENCOUNTER — Telehealth: Payer: Self-pay | Admitting: Allergy and Immunology

## 2015-08-18 ENCOUNTER — Other Ambulatory Visit: Payer: Self-pay

## 2015-08-18 MED ORDER — ALBUTEROL SULFATE (2.5 MG/3ML) 0.083% IN NEBU: 2.5000 mg | INHALATION_SOLUTION | RESPIRATORY_TRACT | Status: DC | PRN

## 2015-08-18 NOTE — Telephone Encounter (Signed)
Mom called again about Lori Miles albuterol for neb she needs it called in. [facility] and [facility]

## 2015-08-18 NOTE — Telephone Encounter (Signed)
I have canceled refill of albuterol

## 2015-08-18 NOTE — Telephone Encounter (Signed)
Sent in refill

## 2015-08-21 NOTE — Telephone Encounter (Signed)
Spoke with mom last week on 08/18/15, reviewed with her the necessity for Eden to be seen in office.  (Last OV January 2017).  Mom indicated multiple reasons why she was not able to come in, including transportation, time of visit--Reviewed with Mom early morning visits and after 5 PM visits and our office is available to communicate with Medicaid transportation--she refused.   Reviewed again with Mom if necessity of medications refills, OV is required.  Able to give 25 vials of albuterol only today .  (One box)--pharmacy was updated.  Mom verbalized understanding of carbonate for office visit.

## 2015-08-22 ENCOUNTER — Encounter: Payer: Self-pay | Admitting: Internal Medicine

## 2015-08-23 ENCOUNTER — Telehealth: Payer: Self-pay | Admitting: Internal Medicine

## 2015-08-23 NOTE — Telephone Encounter (Signed)
Dr. Rhea BeltonPyrtle do you want to see the pts records from a Dr. Levie HeritagePendse? 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 Pts mother states we can get these if we request them. Please advise.

## 2015-08-23 NOTE — Telephone Encounter (Signed)
I do not think we need them at present

## 2015-08-31 ENCOUNTER — Encounter (HOSPITAL_COMMUNITY): Payer: Self-pay

## 2015-08-31 ENCOUNTER — Emergency Department (HOSPITAL_COMMUNITY)
Admission: EM | Admit: 2015-08-31 | Discharge: 2015-09-01 | Disposition: A | Discharge status: Home / Self Care | Payer: Medicare Other | Attending: Emergency Medicine | Admitting: Emergency Medicine

## 2015-08-31 DIAGNOSIS — J45909 Unspecified asthma, uncomplicated: Secondary | ICD-10-CM | POA: Insufficient documentation

## 2015-08-31 DIAGNOSIS — J019 Acute sinusitis, unspecified: Secondary | ICD-10-CM

## 2015-08-31 DIAGNOSIS — R0981 Nasal congestion: Secondary | ICD-10-CM | POA: Diagnosis present

## 2015-08-31 LAB — URINALYSIS, ROUTINE W REFLEX MICROSCOPIC
BILIRUBIN URINE: NEGATIVE
Glucose, UA: NEGATIVE mg/dL
Hgb urine dipstick: NEGATIVE
KETONES UR: 15 mg/dL — AB
NITRITE: NEGATIVE
Protein, ur: 30 mg/dL — AB
Specific Gravity, Urine: 1.033 — ABNORMAL HIGH (ref 1.005–1.030)
pH: 6.5 (ref 5.0–8.0)

## 2015-08-31 LAB — URINE MICROSCOPIC-ADD ON

## 2015-08-31 MED ORDER — ALBUTEROL SULFATE (2.5 MG/3ML) 0.083% IN NEBU
2.5000 mg | INHALATION_SOLUTION | Freq: Once | RESPIRATORY_TRACT | Status: AC
  Administered 2015-08-31: 2.5 mg via RESPIRATORY_TRACT
  Filled 2015-08-31: qty 3

## 2015-08-31 NOTE — ED Provider Notes (Signed)
CSN: [id number]     Arrival date & time 08/31/15  1952 History   First MD Initiated Contact with Patient 08/31/15 2238     Chief Complaint  Patient presents with  . Recurrent Sinusitis  . Emesis     (Consider location/radiation/quality/duration/timing/severity/associated sxs/prior Treatment) Patient is a 20 y.o. female presenting with vomiting and general illness. The history is provided by the patient.  Emesis Associated symptoms: no arthralgias, no chills, no headaches and no myalgias   Illness Severity:  Mild Onset quality:  Gradual Duration:  3 months Timing:  Constant Progression:  Worsening Chronicity:  New Associated symptoms: congestion, cough, nausea and vomiting   Associated symptoms: no chest pain, no fever, no headaches, no myalgias, no rhinorrhea, no shortness of breath and no wheezing    20 yo F With a chief complaint of sinus congestion. This been going on for quite some time. Patient has been seen multiple times by multiple different providers and thought to have recurrent sinusitis. She was referred to an ear nose and throat physician who did not feel like surgery was warranted. Denies any fevers or chills. Has had some cough and vomiting with this. Vomiting mostly phlegm but also some food. Has been eating and drinking without difficulty. Denies abdominal pain.  Past Medical History  Diagnosis Date  . Asthma   . Mental deficiency     hx of aggressive behavior,impaired speech   . Constipation   . Allergy   . Autism     mom states no actual dx, but admits she has some symptoms  . Frequency-urgency syndrome      some incontinence ; wears pull-ups  . Interstitial cystitis   . Bronchitis   . Pneumonia   . Seizure (HCC)     most recent sz " at the end of summer"-last sz years ago per mother   Past Surgical History  Procedure Laterality Date  . Cystoscopy    . Cystoscopy with hydrodistension and biopsy  04/14/2012    Procedure:  CYSTOSCOPY/BIOPSY/HYDRODISTENSION;  Surgeon: Lindaann Slough, MD;  Location: [facility] [place];  Service: Urology;;  instillation of marcaine and pyridium   Family History  Problem Relation Age of Onset  . Seizures Mother   . Seizures Sister     1 sister has  . Pancreatic cancer Sister   . Colon cancer Neg Hx   . Colon polyps Neg Hx   . Esophageal cancer Neg Hx   . Rectal cancer Neg Hx   . Stomach cancer Neg Hx    Social History  Substance Use Topics  . Smoking status: Never Smoker   . Smokeless tobacco: Never Used  . Alcohol Use: No   OB History    No data available     Review of Systems  Constitutional: Negative for fever and chills.  HENT: Positive for congestion. Negative for rhinorrhea.   Eyes: Negative for redness and visual disturbance.  Respiratory: Positive for cough. Negative for shortness of breath and wheezing.   Cardiovascular: Negative for chest pain and palpitations.  Gastrointestinal: Positive for nausea and vomiting.  Genitourinary: Negative for dysuria and urgency.  Musculoskeletal: Negative for myalgias and arthralgias.  Skin: Negative for pallor and wound.  Neurological: Negative for dizziness and headaches.      Allergies  Abilify; Seroquel; Amoxicillin-pot clavulanate; and Keppra  Home Medications   Prior to Admission medications   Medication Sig Start Date End Date Taking? Authorizing Provider  albuterol (PROVENTIL) (2.5 MG/3ML) 0.083% nebulizer solution Take 3  mLs (2.5 mg total) by nebulization every 4 (four) hours as needed for wheezing or shortness of breath (wheezing). 08/18/15  Yes Roselyn Kara MeadM Hicks, MD  budesonide (PULMICORT) 0.5 MG/2ML nebulizer solution Take 2 mLs (0.5 mg total) by nebulization 2 (two) times daily. One vial twice daily 03/28/15  Yes Roselyn Kara MeadM Hicks, MD  cetirizine (ZYRTEC) 10 MG tablet Take 1 tablet (10 mg total) by mouth daily. 03/30/15  Yes Roselyn Kara MeadM Hicks, MD  cloNIDine (CATAPRES) 0.1 MG tablet Take 1+1/2  tab by  mouth at bedtime. 07/10/15  Yes Elveria Risingina Goodpasture, NP  Cranberry 500 MG CAPS Take 500 mg by mouth daily.   Yes Historical Provider, MD  DEPAKOTE 500 MG DR tablet Take 1 tablet twice per day 05/02/15  Yes Elveria Risingina Goodpasture, NP  hyoscyamine (LEVSIN SL) 0.125 MG SL tablet DISSOLVE 1 TABLET UNDER THE TONGUE 2 TIMES A DAY 08/08/14  Yes Historical Provider, MD  Levonorgestrel-Ethinyl Estradiol (SEASONIQUE) 0.15-0.03 &0.01 MG tablet Take 1 tablet by mouth daily. Take 1 active tablet daily, skip non active pills. 10/27/13  Yes Antionette CharLisa Jackson-Moore, MD  Melatonin 3 MG TABS Take 3 mg by mouth at bedtime. For insomnia   Yes Historical Provider, MD  montelukast (SINGULAIR) 10 MG tablet Take 1 tablet (10 mg total) by mouth at bedtime. 05/03/15  Yes Roselyn Kara MeadM Hicks, MD  Multiple Vitamin (MULTIVITAMIN WITH MINERALS) TABS tablet Take 1 tablet by mouth daily.   Yes Historical Provider, MD  nystatin-triamcinolone (MYCOLOG II) cream Apply 1 application topically daily as needed. Reported on 08/08/2015 02/10/15  Yes Historical Provider, MD  Olopatadine HCl 0.6 % SOLN Use one spray twice daily for stuffy nose or drainage. 03/08/15  Yes Roselyn Kara MeadM Hicks, MD  ondansetron (ZOFRAN ODT) 8 MG disintegrating tablet Take 1 tablet (8 mg total) by mouth every 8 (eight) hours as needed for nausea or vomiting. 02/15/15  Yes Joseph ArtJessica U Vann, DO  polyethylene glycol (MIRALAX / GLYCOLAX) packet Take 17 g by mouth daily.   Yes Historical Provider, MD  ranitidine (ZANTAC) 150 MG tablet Twice daily, morning and evening. 08/15/15  Yes Beverley FiedlerJay M Pyrtle, MD  senna (SENOKOT) 8.6 MG tablet Take 1 tablet by mouth daily.   Yes Historical Provider, MD  torsemide (DEMADEX) 10 MG tablet Take 15 mg by mouth daily.    Yes Historical Provider, MD  traZODone (DESYREL) 50 MG tablet Take 2 tabs by mouth at bedtime 07/06/15  Yes Elveria Risingina Goodpasture, NP  vitamin C (ASCORBIC ACID) 500 MG tablet Take 500 mg by mouth daily. Reported on 08/08/2015   Yes Historical Provider, MD   cefdinir (OMNICEF) 300 MG capsule Take 1 capsule (300 mg total) by mouth 2 (two) times daily. 09/01/15   Melene Planan Roxy Filler, DO  promethazine-codeine (PHENERGAN WITH CODEINE) 6.25-10 MG/5ML syrup Take 5 mLs by mouth every 4 (four) hours as needed for cough. Patient not taking: Reported on 08/08/2015 03/15/15   Nyoka CowdenMichael B Wert, MD   BP 92/75 mmHg  Pulse 94  Temp(Src) 97.4 F (36.3 C) (Oral)  Resp 16  SpO2 100% Physical Exam  Constitutional: She is oriented to person, place, and time. She appears well-developed and well-nourished. No distress.  HENT:  Head: Normocephalic and atraumatic.  Swollen turbinates, posterior nasal drip, no noted sinus ttp, tm normal bilaterally.    Eyes: EOM are normal. Pupils are equal, round, and reactive to light.  Neck: Normal range of motion. Neck supple.  Cardiovascular: Normal rate and regular rhythm.  Exam reveals no gallop and no friction  rub.   No murmur heard. Pulmonary/Chest: Effort normal. She has no wheezes. She has no rales.  Abdominal: Soft. She exhibits no distension. There is no tenderness. There is no rebound.  Musculoskeletal: She exhibits no edema or tenderness.  Neurological: She is alert and oriented to person, place, and time.  Skin: Skin is warm and dry. She is not diaphoretic.  Psychiatric: She has a normal mood and affect. Her behavior is normal.  Nursing note and vitals reviewed.   ED Course  Procedures (including critical care time) Labs Review Labs Reviewed  URINALYSIS, ROUTINE W REFLEX MICROSCOPIC (NOT AT [facility]) - Abnormal; Notable for the following:    Color, Urine AMBER (*)    Specific Gravity, Urine 1.033 (*)    Ketones, ur 15 (*)    Protein, ur 30 (*)    Leukocytes, UA TRACE (*)    All other components within normal limits  URINE MICROSCOPIC-ADD ON - Abnormal; Notable for the following:    Squamous Epithelial / LPF 6-30 (*)    Bacteria, UA RARE (*)    All other components within normal limits    Imaging Review No results  found. I have personally reviewed and evaluated these images and lab results as part of my medical decision-making.   EKG Interpretation None      MDM   Final diagnoses:  Acute sinusitis, recurrence not specified, unspecified location    20 yo F with a chief complaints of sinus congestion. This is been worked up by multiple providers. She also has a history of interstitial cystitis. Mom would like a urine checked while she is here. Family thinks it normally improves with Omnicef. We'll have her follow-up with her ear nose and throat physician as well as her PCP.  12:31 AM:  I have discussed the diagnosis/risks/treatment options with the patient and family and believe the pt to be eligible for discharge home to follow-up with PCP. We also discussed returning to the ED immediately if new or worsening sx occur. We discussed the sx which are most concerning (e.g., sudden worsening pain, fever, inability to tolerate by mouth) that necessitate immediate return. Medications administered to the patient during their visit and any new prescriptions provided to the patient are listed below.  Medications given during this visit Medications  albuterol (PROVENTIL) (2.5 MG/3ML) 0.083% nebulizer solution 2.5 mg (2.5 mg Nebulization Given 08/31/15 2331)    New Prescriptions   CEFDINIR (OMNICEF) 300 MG CAPSULE    Take 1 capsule (300 mg total) by mouth 2 (two) times daily.    The patient appears reasonably screen and/or stabilized for discharge and I doubt any other medical condition or other [facility] requiring further screening, evaluation, or treatment in the ED at this time prior to discharge.     Melene Plan, DO 09/01/15 0031

## 2015-08-31 NOTE — ED Notes (Signed)
Mother states pt has been coughing and wheezing as well

## 2015-08-31 NOTE — ED Notes (Signed)
Pt c/o vomiting, cough, possible sinus infection, mother at bedside would also like urine checked and other tests.

## 2015-09-01 ENCOUNTER — Other Ambulatory Visit: Payer: Self-pay

## 2015-09-01 DIAGNOSIS — J45901 Unspecified asthma with (acute) exacerbation: Secondary | ICD-10-CM

## 2015-09-01 MED ORDER — CEFDINIR 250 MG/5ML PO SUSR: 300.0000 mg | Freq: Two times a day (BID) | ORAL | Status: DC

## 2015-09-01 MED ORDER — CEFDINIR 300 MG PO CAPS: 300.0000 mg | ORAL_CAPSULE | Freq: Two times a day (BID) | ORAL | Status: DC

## 2015-09-01 MED ORDER — MONTELUKAST SODIUM 10 MG PO TABS: ORAL_TABLET | ORAL | Status: DC

## 2015-09-01 MED ORDER — CETIRIZINE HCL 10 MG PO TABS: ORAL_TABLET | ORAL | Status: DC

## 2015-09-01 NOTE — Discharge Instructions (Signed)
See your ear nose and throat doctor, and your allergist. Sinusitis, Adult Sinusitis is redness, soreness, and inflammation of the paranasal sinuses. Paranasal sinuses are air pockets within the bones of your face. They are located beneath your eyes, in the middle of your forehead, and above your eyes. In healthy paranasal sinuses, mucus is able to drain out, and air is able to circulate through them by way of your nose. However, when your paranasal sinuses are inflamed, mucus and air can become trapped. This can allow bacteria and other germs to grow and cause infection. Sinusitis can develop quickly and last only a short time (acute) or continue over a long period (chronic). Sinusitis that lasts for more than 12 weeks is considered chronic. CAUSES Causes of sinusitis include:  Allergies.  Structural abnormalities, such as displacement of the cartilage that separates your nostrils (deviated septum), which can decrease the air flow through your nose and sinuses and affect sinus drainage.  Functional abnormalities, such as when the small hairs (cilia) that line your sinuses and help remove mucus do not work properly or are not present. SIGNS AND SYMPTOMS Symptoms of acute and chronic sinusitis are the same. The primary symptoms are pain and pressure around the affected sinuses. Other symptoms include:  Upper toothache.  Earache.  Headache.  Bad breath.  Decreased sense of smell and taste.  A cough, which worsens when you are lying flat.  Fatigue.  Fever.  Thick drainage from your nose, which often is green and may contain pus (purulent).  Swelling and warmth over the affected sinuses. DIAGNOSIS Your health care provider will perform a physical exam. During your exam, your health care provider may perform any of the following to help determine if you have acute sinusitis or chronic sinusitis:  Look in your nose for signs of abnormal growths in your nostrils (nasal polyps).  Tap  over the affected sinus to check for signs of infection.  View the inside of your sinuses using an imaging device that has a light attached (endoscope). If your health care provider suspects that you have chronic sinusitis, one or more of the following tests may be recommended:  Allergy tests.  Nasal culture. A sample of mucus is taken from your nose, sent to a lab, and screened for bacteria.  Nasal cytology. A sample of mucus is taken from your nose and examined by your health care provider to determine if your sinusitis is related to an allergy. TREATMENT Most cases of acute sinusitis are related to a viral infection and will resolve on their own within 10 days. Sometimes, medicines are prescribed to help relieve symptoms of both acute and chronic sinusitis. These may include pain medicines, decongestants, nasal steroid sprays, or saline sprays. However, for sinusitis related to a bacterial infection, your health care provider will prescribe antibiotic medicines. These are medicines that will help kill the bacteria causing the infection. Rarely, sinusitis is caused by a fungal infection. In these cases, your health care provider will prescribe antifungal medicine. For some cases of chronic sinusitis, surgery is needed. Generally, these are cases in which sinusitis recurs more than 3 times per year, despite other treatments. HOME CARE INSTRUCTIONS  Drink plenty of water. Water helps thin the mucus so your sinuses can drain more easily.  Use a humidifier.  Inhale steam 3-4 times a day (for example, sit in the bathroom with the shower running).  Apply a warm, moist washcloth to your face 3-4 times a day, or as directed by your  health care provider.  Use saline nasal sprays to help moisten and clean your sinuses.  Take medicines only as directed by your health care provider.  If you were prescribed either an antibiotic or antifungal medicine, finish it all even if you start to feel  better. SEEK IMMEDIATE MEDICAL CARE IF:  You have increasing pain or severe headaches.  You have nausea, vomiting, or drowsiness.  You have swelling around your face.  You have vision problems.  You have a stiff neck.  You have difficulty breathing.   This information is not intended to replace advice given to you by your health care provider. Make sure you discuss any questions you have with your health care provider.   Document Released: 03/04/2005 Document Revised: 03/25/2014 Document Reviewed: 03/19/2011 Elsevier Interactive Patient Education [employer]2016 [employer]

## 2015-09-07 ENCOUNTER — Ambulatory Visit (INDEPENDENT_AMBULATORY_CARE_PROVIDER_SITE_OTHER): Payer: Medicare Other | Admitting: Allergy and Immunology

## 2015-09-07 ENCOUNTER — Encounter: Payer: Self-pay | Admitting: Allergy and Immunology

## 2015-09-07 VITALS — BP 100/62 | HR 115 | Temp 98.7°F | Resp 20

## 2015-09-07 DIAGNOSIS — R059 Cough, unspecified: Secondary | ICD-10-CM

## 2015-09-07 DIAGNOSIS — J4530 Mild persistent asthma, uncomplicated: Secondary | ICD-10-CM | POA: Diagnosis not present

## 2015-09-07 DIAGNOSIS — R05 Cough: Secondary | ICD-10-CM | POA: Diagnosis not present

## 2015-09-07 DIAGNOSIS — J310 Chronic rhinitis: Secondary | ICD-10-CM | POA: Diagnosis not present

## 2015-09-07 MED ORDER — ALBUTEROL SULFATE (2.5 MG/3ML) 0.083% IN NEBU: INHALATION_SOLUTION | RESPIRATORY_TRACT | Status: DC

## 2015-09-07 MED ORDER — MONTELUKAST SODIUM 10 MG PO TABS: ORAL_TABLET | ORAL | Status: DC

## 2015-09-07 NOTE — Patient Instructions (Addendum)
   Decrease Pulmicort neb to morning only.  Continue Singulair and Zyrtec evening.    Saline nasal wash 1-2 times daily.  Patanase one spray each morning and as needed in evening.  Albuterol neb every 6 hours as needed for cough or wheeze.  Follow-up in November-December 2017 or sooner if needed.

## 2015-09-07 NOTE — Progress Notes (Signed)
FOLLOW UP NOTE  RE: Lori Miles MRN: [id number] DOB: October 01, 1995 [facility] [address] [place] [id number] Date of Office Visit: 09/07/2015  Subjective:  Lori Miles is a 20 y.o. female who presents today for Asthma  Assessment:   1. Mild persistent asthma, well controlled.   2. Cough, multifactorial, likely associated with GI history and component of postnasal drip.   3. Chronic rhinitis--previous negative aeroallergen testing.   4.      Complex medical history.  Plan:   Meds ordered this encounter  Medications  . albuterol (PROVENTIL) (2.5 MG/3ML) 0.083% nebulizer solution    Sig: Nebulize one ampule every four hours as needed for cough or wheeze.    Dispense:  100 mL    Refill:  1  . montelukast (SINGULAIR) 10 MG tablet    Sig: Take one tablet each evening to prevent cough or wheeze.    Dispense:  30 tablet    Refill:  5  1.  Decrease Pulmicort neb to morning only, (I have reviewed with Mom reconsidering Pulmicort flexihaler as I do believe Lori Miles is capable of using that apparatus). 2.  Continue Singulair and Zyrtec evening.   3.  Saline nasal wash 1-2 times daily. 4.  Patanase one spray each morning and as needed in evening. 5.  Albuterol neb every 6 hours as needed for cough or wheeze. 6.  Reviewed with Mom at length the importance of avoiding nebulizer medication overuse in particular for nasal congestion and the need to receive antibiotic and prednisone courses very appropriately   7.  Follow-up in November-December 2017 or sooner if needed.   HPI: Lori Miles returns to the office in follow-up of recurring upper and lower respiratory symptoms and request for medication refills.  Since her last visit in January, Mom feels her breathing is better. She did see Dr. Annalee GentaShoemaker, ENT who preferred to hold off on any procedure and thought our regime was beneficial (though had previously recommended intranasal steroid, which  Lori Miles had not tolerated using).  Deletha was not able to continue following with pulmonology, though had obtained a sinus CT during her symptomatic time which showed only retention cysts, no acute infection.    Mom's most recent concern has been difficulty with vomiting (which occasionally may contribute cough) and she is maintaining on GI medications and had several evaluations with them including endoscopy.  Mom understands there was a normal GI biopsy and has further follow-up appointments pending.  Mom describes overall she is improved as no wheezing, difficulty in breathing or chest/nasal congestion.  No recurring albuterol use and has maintained on twice daily Pulmicort, and daily Singulair, Zyrtec, Patanase.  Denies urgent care visits, prednisone or antibiotic courses. Reports sleep and activity are normal.  Lori Miles has a current medication list which includes the following prescription(s): albuterol, budesonide, cetirizine, clonidine, cranberry, depakote, hyoscyamine, levonorgestrel-ethinyl estradiol, melatonin, montelukast, multivitamin with minerals, olopatadine hcl, ondansetron, polyethylene glycol, ranitidine, senna, torsemide, trazodone.   Drug Allergies: Allergies  Allergen Reactions  . Abilify [Aripiprazole] Swelling and Palpitations  . Seroquel [Quetiapine Fumarate] Palpitations  . Amoxicillin-Pot Clavulanate Hives  . Keppra [Levetiracetam] Other (See Comments)    Aggressive behavior    Objective:   Filed Vitals:   09/07/15 1553  BP: 100/62  Pulse: 115  Temp: 98.7 F (37.1 C)  Resp: 20   SpO2 Readings from Last 1 Encounters:  09/07/15 98%   Physical Exam  Constitutional:  Alert interactive in no acute distress.  HENT:  Head: Atraumatic.  Right Ear: Tympanic membrane and ear canal normal.  Left Ear: Tympanic membrane and ear canal normal.  Nose: Mucosal edema (minimal) present. No rhinorrhea. No epistaxis.  Mouth/Throat: Oropharynx is clear and moist and mucous  membranes are normal. No oropharyngeal exudate, posterior oropharyngeal edema or posterior oropharyngeal erythema.  Neck: Neck supple.  Cardiovascular: Normal rate, S1 normal and S2 normal.   No murmur heard. Pulmonary/Chest: Effort normal. She has no wheezes. She has no rhonchi. She has no rales.  Lymphadenopathy:    She has no cervical adenopathy.   Diagnostics: Spirometry:  FVC 2.29--92%, FEV1 2.05--90%.    Roselyn M. Willa RoughHicks, MD  Cc:

## 2015-09-14 ENCOUNTER — Telehealth: Payer: Self-pay

## 2015-09-14 NOTE — Telephone Encounter (Signed)
I agree with information given to Mom. TG

## 2015-09-14 NOTE — Telephone Encounter (Signed)
Mom lvm asking [name] to make an ENT referral for pt to see Dr. Annalee GentaShoemaker.  I called mom and explained that the referral would need to be made by Rexanne's PCP. I let her know child is due for a f/u with Dr. Sharene SkeansHickling. Mom said that she would call our office later today to schedule.

## 2015-09-18 ENCOUNTER — Other Ambulatory Visit: Payer: Self-pay | Admitting: Allergy and Immunology

## 2015-09-18 ENCOUNTER — Ambulatory Visit (HOSPITAL_COMMUNITY)
Admission: EM | Admit: 2015-09-18 | Discharge: 2015-09-18 | Disposition: A | Discharge status: Home / Self Care | Payer: Medicare Other | Attending: Family Medicine | Admitting: Family Medicine

## 2015-09-18 ENCOUNTER — Encounter (HOSPITAL_COMMUNITY): Payer: Self-pay | Admitting: Nurse Practitioner

## 2015-09-18 DIAGNOSIS — J0101 Acute recurrent maxillary sinusitis: Secondary | ICD-10-CM

## 2015-09-18 MED ORDER — CEFDINIR 300 MG PO CAPS: 300.0000 mg | ORAL_CAPSULE | Freq: Two times a day (BID) | ORAL | Status: DC

## 2015-09-18 MED ORDER — IPRATROPIUM BROMIDE 0.06 % NA SOLN: 2.0000 | Freq: Four times a day (QID) | NASAL | Status: DC

## 2015-09-18 NOTE — ED Provider Notes (Signed)
CSN: [id number]     Arrival date & time 09/18/15  1612 History   First MD Initiated Contact with Patient 09/18/15 1705     Chief Complaint  Patient presents with  . Recurrent Sinusitis   (Consider location/radiation/quality/duration/timing/severity/associated sxs/prior Treatment) Patient is a 20 y.o. female presenting with URI. The history is provided by a parent.  URI Presenting symptoms: congestion and rhinorrhea   Presenting symptoms: no fever   Severity:  Mild Onset quality:  Gradual Duration:  1 week Chronicity:  Recurrent Relieved by:  None tried Worsened by:  Nothing tried Ineffective treatments:  None tried   Past Medical History  Diagnosis Date  . Asthma   . Mental deficiency     hx of aggressive behavior,impaired speech   . Constipation   . Allergy   . Autism     mom states no actual dx, but admits she has some symptoms  . Frequency-urgency syndrome      some incontinence ; wears pull-ups  . Interstitial cystitis   . Bronchitis   . Pneumonia   . Seizure (HCC)     most recent sz " at the end of summer"-last sz years ago per mother   Past Surgical History  Procedure Laterality Date  . Cystoscopy    . Cystoscopy with hydrodistension and biopsy  04/14/2012    Procedure: CYSTOSCOPY/BIOPSY/HYDRODISTENSION;  Surgeon: Lindaann SloughMarc-Henry Nesi, MD;  [facility];  Service: Urology;;  instillation of marcaine and pyridium   Family History  Problem Relation Age of Onset  . Seizures Mother   . Seizures Sister     1 sister has  . Pancreatic cancer Sister   . Colon cancer Neg Hx   . Colon polyps Neg Hx   . Esophageal cancer Neg Hx   . Rectal cancer Neg Hx   . Stomach cancer Neg Hx    Social History  Substance Use Topics  . Smoking status: Never Smoker   . Smokeless tobacco: Never Used  . Alcohol Use: No   OB History    No data available     Review of Systems  Constitutional: Negative.  Negative for fever.  HENT: Positive for congestion,  postnasal drip, rhinorrhea and sinus pressure.   All other systems reviewed and are negative.   Allergies  Abilify; Seroquel; Amoxicillin-pot clavulanate; and Keppra  Home Medications   Prior to Admission medications   Medication Sig Start Date End Date Taking? Authorizing Provider  albuterol (PROVENTIL) (2.5 MG/3ML) 0.083% nebulizer solution Nebulize one ampule every four hours as needed for cough or wheeze. 09/07/15   Roselyn Kara MeadM Hicks, MD  budesonide (PULMICORT) 0.5 MG/2ML nebulizer solution Take 2 mLs (0.5 mg total) by nebulization 2 (two) times daily. One vial twice daily 03/28/15   Baxter Hireoselyn M Hicks, MD  cefdinir (OMNICEF) 300 MG capsule Take 1 capsule (300 mg total) by mouth 2 (two) times daily. 09/18/15   Linna HoffJames D Huntley Demedeiros, MD  cetirizine (ZYRTEC) 10 MG tablet Take one tablet daily for runny nose or itching. 09/01/15   Roselyn Kara MeadM Hicks, MD  cloNIDine (CATAPRES) 0.1 MG tablet Take 1+1/2  tab by mouth at bedtime. 07/10/15   Elveria Risingina Goodpasture, NP  Cranberry 500 MG CAPS Take 500 mg by mouth daily. Reported on 09/07/2015    Historical Provider, MD  DEPAKOTE 500 MG DR tablet Take 1 tablet twice per day 05/02/15   Elveria Risingina Goodpasture, NP  hyoscyamine (LEVSIN SL) 0.125 MG SL tablet DISSOLVE 1 TABLET UNDER THE TONGUE 2 TIMES A  DAY 08/08/14   Historical Provider, MD  ipratropium (ATROVENT) 0.06 % nasal spray Place 2 sprays into both nostrils 4 (four) times daily. 09/18/15   Linna HoffJames D Harika Laidlaw, MD  Levonorgestrel-Ethinyl Estradiol (SEASONIQUE) 0.15-0.03 &0.01 MG tablet Take 1 tablet by mouth daily. Take 1 active tablet daily, skip non active pills. 10/27/13   Antionette CharLisa Jackson-Moore, MD  Melatonin 3 MG TABS Take 3 mg by mouth at bedtime. For insomnia    Historical Provider, MD  montelukast (SINGULAIR) 10 MG tablet Take one tablet each evening to prevent cough or wheeze. 09/07/15   Roselyn Kara MeadM Hicks, MD  Multiple Vitamin (MULTIVITAMIN WITH MINERALS) TABS tablet Take 1 tablet by mouth daily.    Historical Provider, MD   nystatin-triamcinolone (MYCOLOG II) cream Apply 1 application topically daily as needed. Reported on 08/08/2015 02/10/15   Historical Provider, MD  Olopatadine HCl 0.6 % SOLN Use one spray twice daily for stuffy nose or drainage. 03/08/15   Roselyn Kara MeadM Hicks, MD  ondansetron (ZOFRAN ODT) 8 MG disintegrating tablet Take 1 tablet (8 mg total) by mouth every 8 (eight) hours as needed for nausea or vomiting. 02/15/15   Joseph ArtJessica U Vann, DO  polyethylene glycol (MIRALAX / GLYCOLAX) packet Take 17 g by mouth daily.    Historical Provider, MD  promethazine-codeine (PHENERGAN WITH CODEINE) 6.25-10 MG/5ML syrup Take 5 mLs by mouth every 4 (four) hours as needed for cough. Patient not taking: Reported on 09/07/2015 03/15/15   Nyoka CowdenMichael B Wert, MD  ranitidine (ZANTAC) 150 MG tablet Twice daily, morning and evening. 08/15/15   Beverley FiedlerJay M Pyrtle, MD  senna (SENOKOT) 8.6 MG tablet Take 1 tablet by mouth daily.    Historical Provider, MD  torsemide (DEMADEX) 10 MG tablet Take 15 mg by mouth daily.     Historical Provider, MD  traZODone (DESYREL) 50 MG tablet Take 2 tabs by mouth at bedtime 07/06/15   Elveria Risingina Goodpasture, NP  vitamin C (ASCORBIC ACID) 500 MG tablet Take 500 mg by mouth daily. Reported on 09/07/2015    Historical Provider, MD   Meds Ordered and Administered this Visit  Medications - No data to display  BP 103/73 mmHg  Pulse 106  Temp(Src) 99.4 F (37.4 C) (Oral)  Resp 17  SpO2 97% No data found.   Physical Exam  Constitutional: She is oriented to person, place, and time. She appears well-developed and well-nourished. No distress.  HENT:  Right Ear: External ear normal.  Left Ear: External ear normal.  Mouth/Throat: Oropharynx is clear and moist.  Neck: Normal range of motion. Neck supple.  Pulmonary/Chest: Effort normal and breath sounds normal.  Lymphadenopathy:    She has no cervical adenopathy.  Neurological: She is alert and oriented to person, place, and time.  Skin: Skin is warm and dry.   Nursing note and vitals reviewed.   ED Course  Procedures (including critical care time)  Labs Review Labs Reviewed - No data to display  Imaging Review No results found.   Visual Acuity Review  Right Eye Distance:   Left Eye Distance:   Bilateral Distance:    Right Eye Near:   Left Eye Near:    Bilateral Near:         MDM   1. Acute recurrent maxillary sinusitis        Linna HoffJames D Senaida Chilcote, MD 09/18/15 1718

## 2015-09-18 NOTE — ED Notes (Signed)
Pt c/o recurrent sinus infections. She reports worsening symptoms this week, fevers, facial pain. She has been using her nebulizers with no relief. She is alert, breathing easily

## 2015-09-21 ENCOUNTER — Telehealth: Payer: Self-pay | Admitting: Internal Medicine

## 2015-09-21 NOTE — Telephone Encounter (Signed)
Sent to Doc of the day-Dr.Gessner please advise for scheduling

## 2015-09-21 NOTE — Telephone Encounter (Signed)
Pts mother called to give an update and states the pt has been doing better. She did have some problems when she had lots of drainage from a sinus infection but is doing better. Dr. Rhea BeltonPyrtle notified.

## 2015-09-21 NOTE — Telephone Encounter (Signed)
"  [name]", mom, lvm returning my call to [facility] Sharene SkeansHickling. I called mom and scheduled appt for 10-04-15.

## 2015-09-22 NOTE — Telephone Encounter (Signed)
Please disregard message below

## 2015-10-02 DIAGNOSIS — J310 Chronic rhinitis: Secondary | ICD-10-CM | POA: Diagnosis not present

## 2015-10-02 DIAGNOSIS — J0141 Acute recurrent pansinusitis: Secondary | ICD-10-CM | POA: Diagnosis not present

## 2015-10-04 ENCOUNTER — Encounter: Payer: Self-pay | Admitting: Pediatrics

## 2015-10-04 ENCOUNTER — Ambulatory Visit (INDEPENDENT_AMBULATORY_CARE_PROVIDER_SITE_OTHER): Payer: Medicare Other | Admitting: Pediatrics

## 2015-10-04 VITALS — BP 100/60 | HR 102 | Ht <= 58 in | Wt 184.8 lb

## 2015-10-04 DIAGNOSIS — G4700 Insomnia, unspecified: Secondary | ICD-10-CM | POA: Insufficient documentation

## 2015-10-04 DIAGNOSIS — G40209 Localization-related (focal) (partial) symptomatic epilepsy and epileptic syndromes with complex partial seizures, not intractable, without status epilepticus: Secondary | ICD-10-CM

## 2015-10-04 DIAGNOSIS — G40309 Generalized idiopathic epilepsy and epileptic syndromes, not intractable, without status epilepticus: Secondary | ICD-10-CM

## 2015-10-04 DIAGNOSIS — F70 Mild intellectual disabilities: Secondary | ICD-10-CM

## 2015-10-04 MED ORDER — CLONIDINE HCL 0.1 MG PO TABS: ORAL_TABLET | ORAL | Status: DC

## 2015-10-04 MED ORDER — DEPAKOTE 500 MG PO TBEC: DELAYED_RELEASE_TABLET | ORAL | Status: DC

## 2015-10-04 MED ORDER — TRAZODONE HCL 50 MG PO TABS: ORAL_TABLET | ORAL | Status: DC

## 2015-10-04 NOTE — Progress Notes (Signed)
Patient: Lori Miles MRN: [id number] Sex: female DOB: 1996/01/28  Provider: Deetta Perla, MD Location of Care: [facility]  Note type: Routine return visit  History of Present Illness: Referral Source: Lori Contras, MD History from: mother, patient and [facility] chart Chief Complaint: Epilepsy  Lori Miles is a 20 y.o. female who was evaluated on October 04, 2015 for the first time since March 22, 2015.  At that time, I had a face-to-face evaluation to obtain a wheelchair which has been procured and is helping her safely ambulate in public.  Mother has walk as much she can around home.  She has a daily CAPS worker 84 hours a week and 9 hours for respite.  Lori Miles's appetite is good.  Her weight has gone up 6 pounds since her last visit which is relatively low.  Her mother says that she weighed considerably more and has lost some weight.  I have no way to verify this.  If she is kept awake during the day, she falls asleep rather easily at nighttime.  She has occasional arousals at nighttime.  She has had frequent episodes of sinusitis.  There was some question about whether she should have surgery in her nasal passages.  For some reason the ENT surgeon did not want to proceed.  Lori Miles lives with her mother who provides for her care.  Her seizures have been well controlled.  I have no interest in discontinuing her antiepileptic medication because it was not easy to control them in the past.  Review of Systems: 12 system review was assessed and was negative  Past Medical History Diagnosis Date  . Asthma   . Intellectual disability     hx of aggressive behavior,impaired speech   . Constipation   . Allergy   . Autism     mom states no actual dx, but admits she has some symptoms  . Frequency-urgency syndrome      some incontinence ; wears pull-ups  . Interstitial cystitis   . Bronchitis   . Pneumonia   . Seizure (HCC)     most recent sz " at the end of  summer"-last sz years ago per mother   Hospitalizations: Yes.  , Head Injury: No., Nervous System Infections: No., Immunizations up to date: Yes.    EEG on September 14, 2011, was normal. She was able to be successfully weaned off Keppra. The patient has significant issues with aggression, which was thought to be related to Keppra, but has persisted once it was discontinued. The patient was treated with Depakote both for behavior and possible seizures. She had palpitations and severe constipation on Seroquel. She had marked weight gain on Abilify and Depakote. When generic Divalproex was tried she had breakthrough seizures.   Nocturnal polysomnogram on October 27, 2011, failed to show significant periodic limb movements, cyanosis, sleep apnea, or cardiac arrhythmia. EEG on February 20, 2012, was a normal study with the patient awake.  Behavior History sensory seeking behavior  Surgical History Procedure Laterality Date  . Cystoscopy    . Cystoscopy with hydrodistension and biopsy  04/14/2012    Procedure: CYSTOSCOPY/BIOPSY/HYDRODISTENSION;  Surgeon: Lindaann Slough, MD;  Location: [facility];  Service: Urology;;  instillation of marcaine and pyridium   Family History family history includes Pancreatic cancer in her sister; Seizures in her mother and sister. There is no history of Colon cancer, Colon polyps, Esophageal cancer, Rectal cancer, or Stomach cancer. Family history is negative for migraines, seizures, intellectual disabilities, blindness,  deafness, birth defects, chromosomal disorder, or autism.  Social History . Marital Status: Single    Spouse Name: N/A  . Number of Children: N/A  . Years of Education: N/A   Social History Main Topics  . Smoking status: Never Smoker   . Smokeless tobacco: Never Used  . Alcohol Use: No  . Drug Use: No  . Sexual Activity: No   Social History Narrative    Lives with mom Research scientist (physical sciences)(Lori Miles) and half-sister Lori Miles(Lori Miles) who is  also mentally impaired.  Has CAP assistance, urinary incontinence, needs help with feeding,dressing, toileting    Graduated from [employer].  She enjoys playing with cars, dancing, and dogs.   Allergies Allergen Reactions  . Abilify [Aripiprazole] Swelling and Palpitations  . Seroquel [Quetiapine Fumarate] Palpitations  . Amoxicillin-Pot Clavulanate Hives  . Keppra [Levetiracetam] Other (See Comments)    Aggressive behavior    Physical Exam BP 100/60 mmHg  Pulse 102  Ht 4' 9.5" (1.461 m)  Wt 184 lb 12.8 oz (83.825 kg)  BMI 39.27 kg/m2  General: alert, well developed, obese with short stature, in no acute distress, black hair, brown eyes, right handed  Head: normocephalic, no dysmorphic features  Ears, Nose and Throat: Otoscopic: Tympanic membranes normal. Pharynx: oropharynx is pink without exudates or tonsillar hypertrophy.  Neck: supple, full range of motion, no cranial or cervical bruits  Respiratory: auscultation clear  Cardiovascular: no murmurs, pulses are normal  Musculoskeletal: no skeletal deformities or apparent scoliosis  Skin: no rashes or neurocutaneous lesions  Neurologic Exam  Mental Status: alert; she is able to follow some commands. She is able to name objects. She is impulsive.  Cranial Nerves: visual fields are full to double simultaneous stimuli; extraocular movements are full and conjugate; pupils are round reactive to light; funduscopic examination shows sharp disc margins with normal vessels; symmetric facial strength; midline tongue and uvula; air conduction is greater than bone conduction bilaterally.  Motor: Normal strength, tone, and mass; good fine motor movements; no pronator drift.  Sensory: intact responses to touch and temperature Coordination: good finger-to-nose, rapid repetitive alternating movements and finger apposition  Gait and Station: Gait is broad-based slightly unsteady; balance is poor; Romberg exam is negative; Gower  response is negative but difficult Reflexes: symmetric and diminished bilaterally; no clonus; bilateral flexor plantar responses.  Assessment 1. 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 3. Insomnia, G47.00. 4. Mild intellectual disability, F70.  Discussion Lori Miles is neurologically stable.  I am pleased that her seizures are in good control.  Plan I refilled prescriptions for Depakote, clonidine, and trazodone.  She will return to see me in six months' time.  I spent 30 minutes of face-to-face time with [facility] and her mother.   Medication List   This list is accurate as of: 10/04/15 11:59 PM.       albuterol (2.5 MG/3ML) 0.083% nebulizer solution  Commonly known as:  PROVENTIL  Nebulize one ampule every four hours as needed for cough or wheeze.     budesonide 0.5 MG/2ML nebulizer solution  Commonly known as:  PULMICORT  Take 2 mLs (0.5 mg total) by nebulization 2 (two) times daily. One vial twice daily     cefdinir 300 MG capsule  Commonly known as:  OMNICEF  Take 1 capsule (300 mg total) by mouth 2 (two) times daily.     cetirizine 10 MG tablet  Commonly known as:  ZYRTEC  Take one tablet daily for runny nose or  itching.     cloNIDine 0.1 MG tablet  Commonly known as:  CATAPRES  Take 1+1/2  tab by mouth at bedtime.     Cranberry 500 MG Caps  Take 500 mg by mouth daily. Reported on 09/07/2015     DEPAKOTE 500 MG DR tablet  Generic drug:  divalproex  Take 1 tablet twice per day     hyoscyamine 0.125 MG SL tablet  Commonly known as:  LEVSIN SL  DISSOLVE 1 TABLET UNDER THE TONGUE 2 TIMES A DAY     ipratropium 0.06 % nasal spray  Commonly known as:  ATROVENT  Place 2 sprays into both nostrils 4 (four) times daily.     Levonorgestrel-Ethinyl Estradiol 0.15-0.03 &0.01 MG tablet  Commonly known as:  SEASONIQUE  Take 1 tablet by mouth daily. Take 1 active tablet daily, skip non active pills.      Melatonin 3 MG Tabs  Take 3 mg by mouth at bedtime. For insomnia     montelukast 10 MG tablet  Commonly known as:  SINGULAIR  Take one tablet each evening to prevent cough or wheeze.     multivitamin with minerals Tabs tablet  Take 1 tablet by mouth daily.     nystatin-triamcinolone cream  Commonly known as:  MYCOLOG II  Apply 1 application topically daily as needed. Reported on 08/08/2015     Olopatadine HCl 0.6 % Soln  Use one spray twice daily for stuffy nose or drainage.     ondansetron 8 MG disintegrating tablet  Commonly known as:  ZOFRAN ODT  Take 1 tablet (8 mg total) by mouth every 8 (eight) hours as needed for nausea or vomiting.     polyethylene glycol packet  Commonly known as:  MIRALAX / GLYCOLAX  Take 17 g by mouth daily.     promethazine-codeine 6.25-10 MG/5ML syrup  Commonly known as:  PHENERGAN with CODEINE  Take 5 mLs by mouth every 4 (four) hours as needed for cough.     ranitidine 150 MG tablet  Commonly known as:  ZANTAC  Twice daily, morning and evening.     senna 8.6 MG tablet  Commonly known as:  SENOKOT  Take 1 tablet by mouth daily.     torsemide 10 MG tablet  Commonly known as:  DEMADEX  Take 15 mg by mouth daily.     traZODone 50 MG tablet  Commonly known as:  DESYREL  Take 2 tabs by mouth at bedtime     vitamin C 500 MG tablet  Commonly known as:  ASCORBIC ACID  Take 500 mg by mouth daily. Reported on 09/07/2015      The medication list was reviewed and reconciled. All changes or newly prescribed medications were explained.  A complete medication list was provided to the patient/caregiver.  Deetta Perla MD

## 2015-10-27 ENCOUNTER — Telehealth: Payer: Self-pay

## 2015-10-27 NOTE — Telephone Encounter (Signed)
Lori Miles, mom, lvm stating that she would like [name] to call [name] at [facility], [phone]. She said they got the Rx that she gave them for [name]'s Depakote but it is requiring a PA. Lori Miles's CB# [phone]  I called and informed mom that [name] was out of the office. Lori Miles has enough medication to get her through the weekend.

## 2015-10-30 NOTE — Telephone Encounter (Signed)
I obtained prior authorization for the Depakote and called Mom and the pharmacy to let them know. TG

## 2015-10-31 DIAGNOSIS — J310 Chronic rhinitis: Secondary | ICD-10-CM | POA: Diagnosis not present

## 2015-10-31 DIAGNOSIS — J0141 Acute recurrent pansinusitis: Secondary | ICD-10-CM | POA: Diagnosis not present

## 2015-11-13 DIAGNOSIS — K30 Functional dyspepsia: Secondary | ICD-10-CM | POA: Diagnosis not present

## 2015-11-13 DIAGNOSIS — J4520 Mild intermittent asthma, uncomplicated: Secondary | ICD-10-CM | POA: Diagnosis not present

## 2015-11-13 DIAGNOSIS — Z131 Encounter for screening for diabetes mellitus: Secondary | ICD-10-CM | POA: Diagnosis not present

## 2015-11-13 DIAGNOSIS — Z23 Encounter for immunization: Secondary | ICD-10-CM | POA: Diagnosis not present

## 2015-11-13 DIAGNOSIS — N3010 Interstitial cystitis (chronic) without hematuria: Secondary | ICD-10-CM | POA: Diagnosis not present

## 2015-11-13 DIAGNOSIS — Z79899 Other long term (current) drug therapy: Secondary | ICD-10-CM | POA: Diagnosis not present

## 2015-11-13 DIAGNOSIS — Z13220 Encounter for screening for lipoid disorders: Secondary | ICD-10-CM | POA: Diagnosis not present

## 2015-11-13 DIAGNOSIS — R569 Unspecified convulsions: Secondary | ICD-10-CM | POA: Diagnosis not present

## 2015-11-15 ENCOUNTER — Other Ambulatory Visit: Payer: Self-pay | Admitting: *Deleted

## 2015-11-15 MED ORDER — ALBUTEROL SULFATE (2.5 MG/3ML) 0.083% IN NEBU: INHALATION_SOLUTION | RESPIRATORY_TRACT | 2 refills | Status: DC

## 2015-11-25 ENCOUNTER — Emergency Department (HOSPITAL_COMMUNITY)
Admission: EM | Admit: 2015-11-25 | Discharge: 2015-11-26 | Disposition: A | Discharge status: Home / Self Care | Payer: Medicare Other | Attending: Emergency Medicine | Admitting: Emergency Medicine

## 2015-11-25 ENCOUNTER — Encounter (HOSPITAL_COMMUNITY): Payer: Self-pay | Admitting: Oncology

## 2015-11-25 ENCOUNTER — Emergency Department (HOSPITAL_COMMUNITY): Payer: Medicare Other

## 2015-11-25 DIAGNOSIS — J0191 Acute recurrent sinusitis, unspecified: Secondary | ICD-10-CM | POA: Insufficient documentation

## 2015-11-25 DIAGNOSIS — F840 Autistic disorder: Secondary | ICD-10-CM | POA: Diagnosis not present

## 2015-11-25 DIAGNOSIS — J4521 Mild intermittent asthma with (acute) exacerbation: Secondary | ICD-10-CM | POA: Insufficient documentation

## 2015-11-25 DIAGNOSIS — Z79899 Other long term (current) drug therapy: Secondary | ICD-10-CM | POA: Diagnosis not present

## 2015-11-25 DIAGNOSIS — R0602 Shortness of breath: Secondary | ICD-10-CM | POA: Diagnosis not present

## 2015-11-25 DIAGNOSIS — R0981 Nasal congestion: Secondary | ICD-10-CM | POA: Diagnosis not present

## 2015-11-25 MED ORDER — PREDNISONE 20 MG PO TABS
60.0000 mg | ORAL_TABLET | Freq: Once | ORAL | Status: AC
  Administered 2015-11-25: 60 mg via ORAL
  Filled 2015-11-25: qty 3

## 2015-11-25 MED ORDER — DOXYCYCLINE HYCLATE 100 MG PO CAPS: 100.0000 mg | ORAL_CAPSULE | Freq: Two times a day (BID) | ORAL | 0 refills | Status: DC

## 2015-11-25 MED ORDER — DOXYCYCLINE HYCLATE 100 MG PO TABS
100.0000 mg | ORAL_TABLET | Freq: Once | ORAL | Status: AC
  Administered 2015-11-25: 100 mg via ORAL
  Filled 2015-11-25: qty 1

## 2015-11-25 MED ORDER — PREDNISONE 20 MG PO TABS: 40.0000 mg | ORAL_TABLET | Freq: Every day | ORAL | 0 refills | Status: DC

## 2015-11-25 NOTE — ED Provider Notes (Signed)
[facility]-EMER[facility]ENCY [facility] Provider Note   CSN: [id number] Arrival date & time: 11/25/15  2050  By signing my name below, I, Rosario Adie, attest that this documentation has been prepared under the direction and in the presence of [employer], PA-C.  Electronically Signed: Rosario Adie, ED Scribe. 11/25/15. 10:04 PM.  History   Chief Complaint Chief Complaint  Patient presents with  . Shortness of Breath   The history is provided by the patient and a parent. No language interpreter was used.   HPI Comments: Lori Miles is a 20 y.o. female BIB parents, with a PMHx significant of asthma, who presents to the Emergency Department complaining of a gradually improving episode of SOB onset PTA. Per mother, [place] and upon returning home she noticed her SOB and states that the pt experienced several episodes of emesis. She also notes that the pt was making grunting noises with inspirations. Mother reports that her SOB has improved mildly since onset.  Pt recieved Albuterol and Pulmicort, Zyrtec, and Singulair prior to coming into the ED with moderate relief of her symptoms. Her mother expresses that she believes the pt has had a sinus infection over the past several days prior to the onset of her symptoms, with symptoms including nasal congestion and fevers. Pt has not been taking her rx'd Atrovent daily because she does not tolerate nasal sprays well. No other associated symptoms or complaints.    Past Medical History:  Diagnosis Date  . Allergy   . Asthma   . Autism    mom states no actual dx, but admits she has some symptoms  . Bronchitis   . Constipation   . Frequency-urgency syndrome     some incontinence ; wears pull-ups  . Interstitial cystitis   . Mental deficiency    hx of aggressive behavior,impaired speech   . Pneumonia   . Seizure (HCC)    most recent sz " at the end of summer"-last sz years ago per mother   Patient Active Problem List   Diagnosis Date Noted  . Insomnia 10/04/2015  . Acute bronchitis 05/05/2015  . Dyspnea 05/05/2015  . Gait disorder 03/22/2015  . 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 Cough variant asthma vs UACS/ vcd  03/15/2015  . CAP (community acquired pneumonia) 02/14/2015  . Autism spectrum disorder 02/14/2015  . Acute respiratory failure (HCC) 02/14/2015  . Nausea & vomiting 02/14/2015  . Disturbance in sleep behavior 12/13/2014  . Obesity, morbid (HCC) 12/13/2014  . 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 Acanthosis nigricans 11/14/2014  . Toeing-in 08/29/2014  . Obesity (BMI 30-39.9) 08/23/2013  . 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 Interstitial cystitis 04/28/2012  . Recurrent urinary tract infection 08/01/2011  . Asthma 08/01/2011  . Chronic constipation 08/01/2011  . Contraception 08/01/2011   Past Surgical History:  Procedure Laterality Date  . CYSTOSCOPY    . CYSTOSCOPY WITH HYDRODISTENSION AND BIOPSY  04/14/2012   Procedure: CYSTOSCOPY/BIOPSY/HYDRODISTENSION;  Surgeon: Lindaann Slough, MD;  Location: [facility];  Service: Urology;;  instillation of marcaine and pyridium   OB History    No data available     Home Medications    Prior to Admission medications   Medication Sig Start Date End Date  Taking? Authorizing Provider  albuterol (PROVENTIL) (2.5 MG/3ML) 0.083% nebulizer solution Nebulize one ampule every four hours as needed for cough or wheeze. 11/15/15   Jessica PriestEric J Kozlow, MD  budesonide (PULMICORT) 0.5 MG/2ML nebulizer solution Take 2 mLs (0.5 mg total) by nebulization 2 (two) times daily. One vial twice daily 03/28/15   Baxter Hireoselyn M Hicks, MD  cefdinir (OMNICEF) 300 MG capsule Take 1  capsule (300 mg total) by mouth 2 (two) times daily. 09/18/15   Linna HoffJames D Kindl, MD  cetirizine (ZYRTEC) 10 MG tablet Take one tablet daily for runny nose or itching. 09/01/15   Roselyn Kara MeadM Hicks, MD  cloNIDine (CATAPRES) 0.1 MG tablet Take 1+1/2  tab by mouth at bedtime. 10/04/15   Deetta PerlaWilliam H Hickling, MD  Cranberry 500 MG CAPS Take 500 mg by mouth daily. Reported on 09/07/2015    Historical Provider, MD  DEPAKOTE 500 MG DR tablet Take 1 tablet twice per day 10/04/15   Deetta PerlaWilliam H Hickling, MD  doxycycline (VIBRAMYCIN) 100 MG capsule Take 1 capsule (100 mg total) by mouth 2 (two) times daily. For sinus infection. Take with a full glass of water or milk. 11/25/15   Antony MaduraKelly Hamzah Savoca, PA-C  hyoscyamine (LEVSIN SL) 0.125 MG SL tablet DISSOLVE 1 TABLET UNDER THE TONGUE 2 TIMES A DAY 08/08/14   Historical Provider, MD  ipratropium (ATROVENT) 0.06 % nasal spray Place 2 sprays into both nostrils 4 (four) times daily. 09/18/15   Linna HoffJames D Kindl, MD  Levonorgestrel-Ethinyl Estradiol (SEASONIQUE) 0.15-0.03 &0.01 MG tablet Take 1 tablet by mouth daily. Take 1 active tablet daily, skip non active pills. 10/27/13   Antionette CharLisa Jackson-Moore, MD  Melatonin 3 MG TABS Take 3 mg by mouth at bedtime. For insomnia    Historical Provider, MD  montelukast (SINGULAIR) 10 MG tablet Take one tablet each evening to prevent cough or wheeze. 09/07/15   Roselyn Kara MeadM Hicks, MD  Multiple Vitamin (MULTIVITAMIN WITH MINERALS) TABS tablet Take 1 tablet by mouth daily.    Historical Provider, MD  nystatin-triamcinolone (MYCOLOG II) cream Apply 1 application topically daily as needed. Reported on 08/08/2015 02/10/15   Historical Provider, MD  Olopatadine HCl 0.6 % SOLN Use one spray twice daily for stuffy nose or drainage. 03/08/15   Roselyn Kara MeadM Hicks, MD  ondansetron (ZOFRAN ODT) 8 MG disintegrating tablet Take 1 tablet (8 mg total) by mouth every 8 (eight) hours as needed for nausea or vomiting. 02/15/15   Joseph ArtJessica U Vann, DO  polyethylene glycol (MIRALAX / GLYCOLAX) packet  Take 17 g by mouth daily.    Historical Provider, MD  predniSONE (DELTASONE) 20 MG tablet Take 2 tablets (40 mg total) by mouth daily. 11/25/15   Antony MaduraKelly Kden Wagster, PA-C  promethazine-codeine (PHENERGAN WITH CODEINE) 6.25-10 MG/5ML syrup Take 5 mLs by mouth every 4 (four) hours as needed for cough. 03/15/15   Nyoka CowdenMichael B Wert, MD  ranitidine (ZANTAC) 150 MG tablet Twice daily, morning and evening. 08/15/15   Beverley FiedlerJay M Pyrtle, MD  senna (SENOKOT) 8.6 MG tablet Take 1 tablet by mouth daily.    Historical Provider, MD  torsemide (DEMADEX) 10 MG tablet Take 15 mg by mouth daily.     Historical Provider, MD  traZODone (DESYREL) 50 MG tablet Take 2 tabs by mouth at bedtime 10/04/15   Deetta PerlaWilliam H Hickling, MD  vitamin C (ASCORBIC ACID) 500 MG tablet Take 500 mg by mouth daily. Reported on 09/07/2015    Historical Provider, MD    Family History Family History  Problem Relation Age of  Onset  . Seizures Mother   . Seizures Sister     1 sister has  . Pancreatic cancer Sister   . Colon cancer Neg Hx   . Colon polyps Neg Hx   . Esophageal cancer Neg Hx   . Rectal cancer Neg Hx   . Stomach cancer Neg Hx    Social History Social History  Substance Use Topics  . Smoking status: Never Smoker  . Smokeless tobacco: Never Used  . Alcohol use No   Allergies   Abilify [aripiprazole]; Seroquel [quetiapine fumarate]; Amoxicillin-pot clavulanate; and Keppra [levetiracetam]  Review of Systems Review of Systems  Constitutional: Positive for fever.  HENT: Positive for congestion.   Respiratory: Positive for shortness of breath.   Gastrointestinal: Positive for vomiting.  A complete 10 system review of systems was obtained and all systems are negative except as noted in the HPI and PMH.    Physical Exam Updated Vital Signs BP 99/64   Pulse 75   Temp 97.8 F (36.6 C) (Oral)   Resp 18   Ht 4' 9.5" (1.461 m)   Wt 83.6 kg   SpO2 97%   BMI 39.19 kg/m   Physical Exam  Constitutional: She is oriented to person,  place, and time. She appears well-developed and well-nourished. No distress.  Nontoxic appearing and in no distress  HENT:  Head: Normocephalic and atraumatic.  No middle ear effusion noted bilaterally. No bulging, retraction, or perforation to bilateral TMs. Bilateral nares patent. No septal deviation or hematoma. Uvula midline and oropharynx clear. Patient tolerating secretions without difficulty.  Eyes: Conjunctivae and EOM are normal. No scleral icterus.  Neck: Normal range of motion.  Cardiovascular: Normal rate, regular rhythm and intact distal pulses.   Pulmonary/Chest: Effort normal. No respiratory distress. She has no wheezes. She has no rales.  Lungs grossly clear bilaterally. No accessory muscle use or stridor. Chest expansion symmetric.  Musculoskeletal: Normal range of motion.  Neurological: She is alert and oriented to person, place, and time.  Skin: Skin is warm and dry. No rash noted. She is not diaphoretic. No erythema. No pallor.  Psychiatric: She has a normal mood and affect. Her behavior is normal.  Nursing note and vitals reviewed.  ED Treatments / Results  DIAGNOSTIC STUDIES: Oxygen Saturation is 100% on RA, normal by my interpretation.   COORDINATION OF CARE: 10:04 PM-Discussed next steps with pt. Pt verbalized understanding and is agreeable with the plan.   Labs (all labs ordered are listed, but only abnormal results are displayed) Labs Reviewed - No data to display  EKG  EKG Interpretation None       Radiology Dg Chest 2 View  Result Date: 11/25/2015 CLINICAL DATA:  Decreased lung sounds. Sinus congestion and shortness of breath. EXAM: CHEST  2 VIEW COMPARISON:  05/28/2015 FINDINGS: The heart size and mediastinal contours are within normal limits. Both lungs are clear. The visualized skeletal structures are unremarkable. IMPRESSION: No active cardiopulmonary disease. Electronically Signed   By: Signa Kell M.D.   On: 11/25/2015 21:41     Procedures Procedures (including critical care time)  Medications Ordered in ED Medications  predniSONE (DELTASONE) tablet 60 mg (60 mg Oral Given 11/25/15 2218)  doxycycline (VIBRA-TABS) tablet 100 mg (100 mg Oral Given 11/25/15 2217)     Initial Impression / Assessment and Plan / ED Course  I have reviewed the triage vital signs and the nursing notes.  Pertinent labs & imaging results that were available during my care of the  patient were reviewed by me and considered in my medical decision making (see chart for details).  Clinical Course    20 year old female presents to the emergency department for evaluation of shortness of breath after being at a fair today. Mother gave albuterol and Pulmicort treatments prior to arrival. Symptoms have resolved with these medications. Chest x-ray shows no complicating process such as pleural effusion or pneumonia. Patient is afebrile and without hypoxia. Mother reports that she is now breathing at baseline. Plan to start on a 5 day burst of steroids to prevent symptom recurrence.  Mother also expresses concern about sinusitis. Patient has a well-documented history of recurrent sinusitis which is often treated with antibiotics. Mother is primarily concerned about this because it often worsens the patient's asthma. Mother reports a fever 2 days ago. I have discussed that symptoms are likely secondary to a viral process. Mother is requesting treatment with antibiotics despite our discussion. Will start on doxycycline and have the patient follow up with her PCP. I do not believe further emergent workup or monitoring is indicated. Patient discharged in satisfactory condition; family with no unaddressed concerns.   Final Clinical Impressions(s) / ED Diagnoses   Final diagnoses:  Acute asthma exacerbation, mild intermittent  Acute recurrent sinusitis, unspecified location    Vitals:   11/25/15 2054 11/25/15 2247  BP: 113/73 99/64  Pulse: 94 75   Resp: 12 18  Temp: 98.7 F (37.1 C) 97.8 F (36.6 C)  TempSrc: Oral Oral  SpO2: 100% 97%  Weight: 83.6 kg   Height: 4' 9.5" (1.461 m)     New Prescriptions Discharge Medication List as of 11/25/2015 10:31 PM    START taking these medications   Details  doxycycline (VIBRAMYCIN) 100 MG capsule Take 1 capsule (100 mg total) by mouth 2 (two) times daily. For sinus infection. Take with a full glass of water or milk., Starting Sat 11/25/2015, Print    predniSONE (DELTASONE) 20 MG tablet Take 2 tablets (40 mg total) by mouth daily., Starting Sat 11/25/2015, Print        I personally performed the services described in this documentation, which was scribed in my presence. The recorded information has been reviewed and is accurate.       Antony Madura, PA-C 11/26/15 1610    Lavera Guise, MD 11/26/15 1520

## 2015-11-25 NOTE — ED Triage Notes (Signed)
Pt was bib mom d/t sinus congestion and shob.  Pt lung sounds are decreased however pt will not take a deep breath.  Pt's mom reports that pt has [place] the [place] all [place].  Upon returning home pt's mom noticed these sx.

## 2015-11-25 NOTE — Discharge Instructions (Signed)
Follow up with your primary care doctor regarding your ED visit today. Take doxycyline as prescribed for a sinus infection and prednisone to help improve your asthma symptoms. Continue your daily medications. Return, as needed, for new or worsening symptoms.

## 2015-11-26 ENCOUNTER — Telehealth (HOSPITAL_BASED_OUTPATIENT_CLINIC_OR_DEPARTMENT_OTHER): Payer: Self-pay

## 2015-11-26 MED ORDER — CEFDINIR 300 MG PO CAPS: 300.0000 mg | ORAL_CAPSULE | Freq: Two times a day (BID) | ORAL | 0 refills | Status: DC

## 2015-11-26 NOTE — ED Provider Notes (Signed)
She presents complaining of being intolerant of doxycycline and requested cefdinir. I wrote her the prescription.   Mancel BaleElliott Abygayle Deltoro, MD 11/26/15 2034

## 2015-11-26 NOTE — ED Notes (Signed)
Dr. Effie ShyWentz gave new rx for antibiotic as pt had an allergy to the one prescribed during her previous visit.

## 2015-11-29 ENCOUNTER — Telehealth: Payer: Self-pay | Admitting: *Deleted

## 2015-11-29 NOTE — Telephone Encounter (Signed)
Patient's mother is calling to remind Lori Miles to [facility]. They would like him to order a urology [facility] so she doesn't have to travel in the car all the way to [place]. She doesn't do well in the vehicle.

## 2015-12-04 ENCOUNTER — Other Ambulatory Visit: Payer: Self-pay | Admitting: Obstetrics

## 2015-12-05 DIAGNOSIS — R350 Frequency of micturition: Secondary | ICD-10-CM | POA: Diagnosis not present

## 2015-12-05 DIAGNOSIS — N3020 Other chronic cystitis without hematuria: Secondary | ICD-10-CM | POA: Diagnosis not present

## 2015-12-11 ENCOUNTER — Other Ambulatory Visit: Payer: Self-pay | Admitting: Obstetrics

## 2015-12-13 DIAGNOSIS — N281 Cyst of kidney, acquired: Secondary | ICD-10-CM | POA: Diagnosis not present

## 2015-12-13 DIAGNOSIS — R32 Unspecified urinary incontinence: Secondary | ICD-10-CM | POA: Diagnosis not present

## 2015-12-13 DIAGNOSIS — R350 Frequency of micturition: Secondary | ICD-10-CM | POA: Diagnosis not present

## 2015-12-13 DIAGNOSIS — K5900 Constipation, unspecified: Secondary | ICD-10-CM | POA: Diagnosis not present

## 2015-12-13 DIAGNOSIS — N390 Urinary tract infection, site not specified: Secondary | ICD-10-CM | POA: Diagnosis not present

## 2015-12-18 ENCOUNTER — Ambulatory Visit (HOSPITAL_COMMUNITY)
Admission: EM | Admit: 2015-12-18 | Discharge: 2015-12-18 | Disposition: A | Discharge status: Home / Self Care | Payer: Medicare Other | Attending: Internal Medicine | Admitting: Internal Medicine

## 2015-12-18 ENCOUNTER — Encounter (HOSPITAL_COMMUNITY): Payer: Self-pay | Admitting: Emergency Medicine

## 2015-12-18 DIAGNOSIS — J069 Acute upper respiratory infection, unspecified: Secondary | ICD-10-CM | POA: Diagnosis not present

## 2015-12-18 DIAGNOSIS — W57XXXA Bitten or stung by nonvenomous insect and other nonvenomous arthropods, initial encounter: Secondary | ICD-10-CM

## 2015-12-18 MED ORDER — HYDROCORTISONE VALERATE 0.2 % EX OINT: 1.0000 "application " | TOPICAL_OINTMENT | Freq: Two times a day (BID) | CUTANEOUS | 0 refills | Status: DC

## 2015-12-18 MED ORDER — BENZONATATE 200 MG PO CAPS: 200.0000 mg | ORAL_CAPSULE | Freq: Three times a day (TID) | ORAL | 1 refills | Status: DC | PRN

## 2015-12-18 NOTE — ED Provider Notes (Signed)
[facility]    CSN: [id number] Arrival date & time: 12/18/15  1926     History   Chief Complaint Chief Complaint  Patient presents with  . Cough    HPI Lori Miles is a 20 y.o. female. She presents today with a couple days' history of runny/congested nose, coughing spells, tactile temperature.  Post tussive emesis x 1 today.  Discolored/blood streaked nasal discharge.  Frequent episodes of upper respiratory infection.    HPI  Past Medical History:  Diagnosis Date  . Allergy   . Asthma   . Autism    mom states no actual dx, but admits she has some symptoms  . Bronchitis   . Constipation   . Frequency-urgency syndrome     some incontinence ; wears pull-ups  . Interstitial cystitis   . Mental deficiency    hx of aggressive behavior,impaired speech   . Pneumonia   . Seizure (HCC)    most recent sz " at the end of summer"-last sz years ago per mother    Patient Active Problem List   Diagnosis Date Noted  . Insomnia 10/04/2015  . Acute bronchitis 05/05/2015  . Dyspnea 05/05/2015  . Gait disorder 03/22/2015  . 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 Cough variant asthma vs UACS/ vcd  03/15/2015  . CAP (community acquired pneumonia) 02/14/2015  . Autism spectrum disorder 02/14/2015  . Acute respiratory failure (HCC) 02/14/2015  . Nausea & vomiting 02/14/2015  . Disturbance in sleep behavior 12/13/2014  . Obesity, morbid (HCC) 12/13/2014  . 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 Acanthosis nigricans 11/14/2014  . Toeing-in 08/29/2014  . Obesity (BMI 30-39.9) 08/23/2013  . 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 Interstitial cystitis 04/28/2012  . Recurrent urinary tract infection  08/01/2011  . Asthma 08/01/2011  . Chronic constipation 08/01/2011  . Contraception 08/01/2011    Past Surgical History:  Procedure Laterality Date  . CYSTOSCOPY    . CYSTOSCOPY WITH HYDRODISTENSION AND BIOPSY  04/14/2012   Procedure: CYSTOSCOPY/BIOPSY/HYDRODISTENSION;  Surgeon: Lindaann Slough, MD;  Location: [facility];  Service: Urology;;  instillation of marcaine and pyridium     Home Medications    Prior to Admission medications   Medication Sig Start Date End Date Taking? Authorizing Provider  albuterol (PROVENTIL) (2.5 MG/3ML) 0.083% nebulizer solution Nebulize one ampule every four hours as needed for cough or wheeze. 11/15/15   Jessica Priest, MD  benzonatate (TESSALON) 200 MG capsule Take 1 capsule (200 mg total) by mouth 3 (three) times daily as needed for cough. 12/18/15   Eustace Moore, MD  budesonide (PULMICORT) 0.5 MG/2ML nebulizer solution Take 2 mLs (0.5 mg total) by nebulization 2 (two) times daily. One vial twice daily 03/28/15   Baxter Hire, MD  cetirizine (ZYRTEC) 10 MG tablet Take one tablet daily for runny nose or itching. 09/01/15   Roselyn Kara Mead, MD  cloNIDine (CATAPRES) 0.1 MG tablet Take 1+1/2  tab by mouth at bedtime. 10/04/15   Deetta Perla, MD  Cranberry 500 MG CAPS Take 500 mg by mouth daily. Reported on 09/07/2015    Historical Provider, MD  DEPAKOTE 500 MG DR tablet Take 1 tablet twice per day 10/04/15   Deetta Perla, MD  fluconazole (DIFLUCAN) 100 MG  tablet TAKE 1 TABLET ONCE REPEAT IN 48 TO 72 HOURS 12/13/15   Rachelle A Denney, CNM  hydrocortisone valerate ointment (WESTCORT) 0.2 % Apply 1 application topically 2 (two) times daily. 12/18/15   Eustace MooreLaura W Irma Roulhac, MD  hyoscyamine (LEVSIN SL) 0.125 MG SL tablet DISSOLVE 1 TABLET UNDER THE TONGUE 2 TIMES A DAY 08/08/14   Historical Provider, MD  ipratropium (ATROVENT) 0.06 % nasal spray Place 2 sprays into both nostrils 4 (four) times daily. 09/18/15   Linna HoffJames D Kindl, MD    Levonorgestrel-Ethinyl Estradiol (SEASONIQUE) 0.15-0.03 &0.01 MG tablet Take 1 tablet by mouth daily. Take 1 active tablet daily, skip non active pills. 10/27/13   Antionette CharLisa Jackson-Moore, MD  Melatonin 3 MG TABS Take 3 mg by mouth at bedtime. For insomnia    Historical Provider, MD  montelukast (SINGULAIR) 10 MG tablet Take one tablet each evening to prevent cough or wheeze. 09/07/15   Roselyn Kara MeadM Hicks, MD  Multiple Vitamin (MULTIVITAMIN WITH MINERALS) TABS tablet Take 1 tablet by mouth daily.    Historical Provider, MD  nystatin-triamcinolone (MYCOLOG II) cream Apply 1 application topically daily as needed. Reported on 08/08/2015 02/10/15   Historical Provider, MD  Olopatadine HCl 0.6 % SOLN Use one spray twice daily for stuffy nose or drainage. 03/08/15   Roselyn Kara MeadM Hicks, MD  ondansetron (ZOFRAN ODT) 8 MG disintegrating tablet Take 1 tablet (8 mg total) by mouth every 8 (eight) hours as needed for nausea or vomiting. 02/15/15   Joseph ArtJessica U Vann, DO  polyethylene glycol (MIRALAX / GLYCOLAX) packet Take 17 g by mouth daily.    Historical Provider, MD  predniSONE (DELTASONE) 20 MG tablet Take 2 tablets (40 mg total) by mouth daily. 11/25/15   Antony MaduraKelly Humes, PA-C  ranitidine (ZANTAC) 150 MG tablet Twice daily, morning and evening. 08/15/15   Beverley FiedlerJay M Pyrtle, MD  senna (SENOKOT) 8.6 MG tablet Take 1 tablet by mouth daily.    Historical Provider, MD  torsemide (DEMADEX) 10 MG tablet Take 15 mg by mouth daily.     Historical Provider, MD  traZODone (DESYREL) 50 MG tablet Take 2 tabs by mouth at bedtime 10/04/15   Deetta PerlaWilliam H Hickling, MD  vitamin C (ASCORBIC ACID) 500 MG tablet Take 500 mg by mouth daily. Reported on 09/07/2015    Historical Provider, MD    Family History Family History  Problem Relation Age of Onset  . Seizures Mother   . Seizures Sister     1 sister has  . Pancreatic cancer Sister   . Colon cancer Neg Hx   . Colon polyps Neg Hx   . Esophageal cancer Neg Hx   . Rectal cancer Neg Hx   . Stomach  cancer Neg Hx     Social History Social History  Substance Use Topics  . Smoking status: Never Smoker  . Smokeless tobacco: Never Used  . Alcohol use No     Allergies   Abilify [aripiprazole]; Seroquel [quetiapine fumarate]; Amoxicillin-pot clavulanate; and Keppra [levetiracetam]   Review of Systems Review of Systems  All other systems reviewed and are negative.    Physical Exam Triage Vital Signs ED Triage Vitals [12/18/15 2014]  Enc Vitals Group     BP 100/81     Pulse Rate 101     Resp 14     Temp 99.3 F (37.4 C)     Temp Source Oral     SpO2 99 %     Weight      Height  Updated Vital Signs BP 100/81 (BP Location: Left Arm)   Pulse 101   Temp 99.3 F (37.4 C) (Oral)   Resp 14   SpO2 99%  Physical Exam  Constitutional: She is oriented to person, place, and time. No distress.  Alert, nicely groomed  HENT:  Head: Atraumatic.  B TMs dull, no erythema Mod nasal congestion bilat, mucosa excoriated Throat a little red  Eyes:  Conjugate gaze, no eye redness/drainage  Neck: Neck supple.  Cardiovascular: Normal rate.   HR 100s  Pulmonary/Chest: No respiratory distress. She has no wheezes. She has no rales.  Lungs clear, symmetric breath sounds  Abdominal: She exhibits no distension.  Musculoskeletal: Normal range of motion.  No leg swelling  Neurological: She is alert and oriented to person, place, and time.  Skin: Skin is warm and dry.  No cyanosis  Nursing note and vitals reviewed.    UC Treatments / Results   Procedures Procedures (including critical care time)      None today  Final Clinical Impressions(s) / UC Diagnoses   Final diagnoses:  Acute upper respiratory infection  Bug bite, initial encounter    New Prescriptions New Prescriptions   BENZONATATE (TESSALON) 200 MG CAPSULE    Take 1 capsule (200 mg total) by mouth 3 (three) times daily as needed for cough.   HYDROCORTISONE VALERATE OINTMENT (WESTCORT) 0.2 %    Apply 1  application topically 2 (two) times daily.     Eustace Moore, MD 12/20/15 [phone]

## 2015-12-18 NOTE — ED Triage Notes (Addendum)
Symptoms started last night.  Patient returned to her home from a weekend away.  Mother reports patient has respiratory symptoms.  Patient's mother reports patient was coughing up discolored , ?bloody phlegm.  Color of phlegm is burgundy in color.    Patient also has possibly 3 insect bites to left leg

## 2015-12-31 ENCOUNTER — Encounter (HOSPITAL_COMMUNITY): Payer: Self-pay | Admitting: Emergency Medicine

## 2015-12-31 ENCOUNTER — Ambulatory Visit (HOSPITAL_COMMUNITY)
Admission: EM | Admit: 2015-12-31 | Discharge: 2015-12-31 | Disposition: A | Discharge status: Home / Self Care | Payer: Medicare Other | Attending: Internal Medicine | Admitting: Internal Medicine

## 2015-12-31 ENCOUNTER — Emergency Department (HOSPITAL_COMMUNITY): Payer: Medicare Other

## 2015-12-31 ENCOUNTER — Emergency Department (HOSPITAL_COMMUNITY)
Admission: EM | Admit: 2015-12-31 | Discharge: 2015-12-31 | Disposition: A | Discharge status: Home / Self Care | Payer: Medicare Other | Attending: Emergency Medicine | Admitting: Emergency Medicine

## 2015-12-31 ENCOUNTER — Encounter (HOSPITAL_COMMUNITY): Payer: Self-pay | Admitting: Family Medicine

## 2015-12-31 DIAGNOSIS — J40 Bronchitis, not specified as acute or chronic: Secondary | ICD-10-CM | POA: Diagnosis not present

## 2015-12-31 DIAGNOSIS — J029 Acute pharyngitis, unspecified: Secondary | ICD-10-CM | POA: Diagnosis present

## 2015-12-31 DIAGNOSIS — J209 Acute bronchitis, unspecified: Secondary | ICD-10-CM

## 2015-12-31 DIAGNOSIS — J4521 Mild intermittent asthma with (acute) exacerbation: Secondary | ICD-10-CM

## 2015-12-31 DIAGNOSIS — Z79899 Other long term (current) drug therapy: Secondary | ICD-10-CM | POA: Diagnosis not present

## 2015-12-31 DIAGNOSIS — R112 Nausea with vomiting, unspecified: Secondary | ICD-10-CM | POA: Diagnosis not present

## 2015-12-31 DIAGNOSIS — R05 Cough: Secondary | ICD-10-CM | POA: Diagnosis not present

## 2015-12-31 LAB — URINE MICROSCOPIC-ADD ON

## 2015-12-31 LAB — POC URINE PREG, ED: Preg Test, Ur: NEGATIVE

## 2015-12-31 LAB — URINALYSIS, ROUTINE W REFLEX MICROSCOPIC
BILIRUBIN URINE: NEGATIVE
GLUCOSE, UA: 500 mg/dL — AB
HGB URINE DIPSTICK: NEGATIVE
KETONES UR: NEGATIVE mg/dL
NITRITE: NEGATIVE
PH: 5.5 (ref 5.0–8.0)
Protein, ur: NEGATIVE mg/dL
SPECIFIC GRAVITY, URINE: 1.014 (ref 1.005–1.030)

## 2015-12-31 MED ORDER — IPRATROPIUM-ALBUTEROL 0.5-2.5 (3) MG/3ML IN SOLN
3.0000 mL | Freq: Once | RESPIRATORY_TRACT | Status: AC
  Administered 2015-12-31: 3 mL via RESPIRATORY_TRACT

## 2015-12-31 MED ORDER — PREDNISONE 10 MG PO TABS: 20.0000 mg | ORAL_TABLET | Freq: Every day | ORAL | 0 refills | Status: DC

## 2015-12-31 MED ORDER — ONDANSETRON HCL 4 MG PO TABS: 4.0000 mg | ORAL_TABLET | Freq: Four times a day (QID) | ORAL | 0 refills | Status: DC

## 2015-12-31 MED ORDER — CEFDINIR 300 MG PO CAPS: 300.0000 mg | ORAL_CAPSULE | Freq: Two times a day (BID) | ORAL | 0 refills | Status: DC

## 2015-12-31 MED ORDER — IPRATROPIUM-ALBUTEROL 0.5-2.5 (3) MG/3ML IN SOLN
RESPIRATORY_TRACT | Status: AC
  Filled 2015-12-31: qty 3

## 2015-12-31 MED ORDER — AZITHROMYCIN 250 MG PO TABS: 250.0000 mg | ORAL_TABLET | Freq: Every day | ORAL | 0 refills | Status: DC

## 2015-12-31 NOTE — ED Notes (Signed)
Patient called to room x2, no answer. Patient not in lobby or outside ED

## 2015-12-31 NOTE — ED Triage Notes (Signed)
Patient recently getting over sinus infection and last night started having cough. patient gets to coughing so hard she gets SOB and vomits brown/green liquid.  Patient used neb treatment at home before coming here with some relief then cough starts again.

## 2015-12-31 NOTE — ED Provider Notes (Signed)
[facility]-EMERGENCY DEPT Provider Note   CSN: [id number] Arrival date & time: 12/31/15  1004    Level V History   Chief Complaint Chief Complaint  Patient presents with  . Cough  . sore throat  . Dysuria  History obtained from mother. Level V caveat.mt mentally reyarded.  HPI Mackenna Kamer is a 20 y.o. female. Patient has cough and wheezing since last night. Has had nasal congestion for one week. No fever. Treated with albuterol nebulizer at home, patient presently denies any discomfort. Mother reports that she gets more short of breath with lying flat. She's had 4 episodes of vomiting in the past 24 hours. No nausea present and presently states she's hungry. She denies any abdominal pain mother states she becomes incontinent of urine with coughing  HPI  Past Medical History:  Diagnosis Date  . Allergy   . Asthma   . Autism    mom states no actual dx, but admits she has some symptoms  . Bronchitis   . Constipation   . Frequency-urgency syndrome     some incontinence ; wears pull-ups  . Interstitial cystitis   . Mental deficiency    hx of aggressive behavior,impaired speech   . Pneumonia   . Seizure (HCC)    most recent sz " at the end of summer"-last sz years ago per mother    Patient Active Problem List   Diagnosis Date Noted  . Insomnia 10/04/2015  . Acute bronchitis 05/05/2015  . Dyspnea 05/05/2015  . Gait disorder 03/22/2015  . 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 Cough variant asthma vs UACS/ vcd  03/15/2015  . CAP (community acquired pneumonia) 02/14/2015  . Autism spectrum disorder 02/14/2015  . Acute respiratory failure (HCC) 02/14/2015  . Nausea & vomiting 02/14/2015  . Disturbance in sleep behavior 12/13/2014  . Obesity, morbid (HCC) 12/13/2014  . 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 Acanthosis nigricans 11/14/2014  . Toeing-in 08/29/2014  .  Obesity (BMI 30-39.9) 08/23/2013  . 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 Interstitial cystitis 04/28/2012  . Recurrent urinary tract infection 08/01/2011  . Asthma 08/01/2011  . Chronic constipation 08/01/2011  . Contraception 08/01/2011    Past Surgical History:  Procedure Laterality Date  . CYSTOSCOPY    . CYSTOSCOPY WITH HYDRODISTENSION AND BIOPSY  04/14/2012   Procedure: CYSTOSCOPY/BIOPSY/HYDRODISTENSION;  Surgeon: Lindaann Slough, MD;  Location: [facility];  Service: Urology;;  instillation of marcaine and pyridium    OB History    No data available       Home Medications    Prior to Admission medications   Medication Sig Start Date End Date Taking? Authorizing Provider  albuterol (PROVENTIL) (2.5 MG/3ML) 0.083% nebulizer solution Nebulize one ampule every four hours as needed for cough or wheeze. 11/15/15   Jessica Priest, MD  benzonatate (TESSALON) 200 MG capsule Take 1 capsule (200 mg total) by mouth 3 (three) times daily as needed for cough. 12/18/15   Eustace Moore, MD  budesonide (PULMICORT) 0.5 MG/2ML nebulizer solution Take 2 mLs (0.5 mg total) by nebulization 2 (two) times daily. One vial twice daily 03/28/15   Baxter Hire, MD  cetirizine (ZYRTEC) 10 MG tablet Take one tablet daily for runny nose or itching. 09/01/15   Roselyn Kara Mead,  MD  cloNIDine (CATAPRES) 0.1 MG tablet Take 1+1/2  tab by mouth at bedtime. 10/04/15   Deetta Perla, MD  Cranberry 500 MG CAPS Take 500 mg by mouth daily. Reported on 09/07/2015    Historical Provider, MD  DEPAKOTE 500 MG DR tablet Take 1 tablet twice per day 10/04/15   Deetta Perla, MD  fluconazole (DIFLUCAN) 100 MG tablet TAKE 1 TABLET ONCE REPEAT IN 48 TO 72 HOURS 12/13/15   Rachelle A Denney, CNM  hydrocortisone valerate ointment  (WESTCORT) 0.2 % Apply 1 application topically 2 (two) times daily. 12/18/15   Eustace Moore, MD  hyoscyamine (LEVSIN SL) 0.125 MG SL tablet DISSOLVE 1 TABLET UNDER THE TONGUE 2 TIMES A DAY 08/08/14   Historical Provider, MD  ipratropium (ATROVENT) 0.06 % nasal spray Place 2 sprays into both nostrils 4 (four) times daily. 09/18/15   Linna Hoff, MD  Levonorgestrel-Ethinyl Estradiol (SEASONIQUE) 0.15-0.03 &0.01 MG tablet Take 1 tablet by mouth daily. Take 1 active tablet daily, skip non active pills. 10/27/13   Antionette Char, MD  Melatonin 3 MG TABS Take 3 mg by mouth at bedtime. For insomnia    Historical Provider, MD  montelukast (SINGULAIR) 10 MG tablet Take one tablet each evening to prevent cough or wheeze. 09/07/15   Roselyn Kara Mead, MD  Multiple Vitamin (MULTIVITAMIN WITH MINERALS) TABS tablet Take 1 tablet by mouth daily.    Historical Provider, MD  nystatin-triamcinolone (MYCOLOG II) cream Apply 1 application topically daily as needed. Reported on 08/08/2015 02/10/15   Historical Provider, MD  Olopatadine HCl 0.6 % SOLN Use one spray twice daily for stuffy nose or drainage. 03/08/15   Roselyn Kara Mead, MD  ondansetron (ZOFRAN ODT) 8 MG disintegrating tablet Take 1 tablet (8 mg total) by mouth every 8 (eight) hours as needed for nausea or vomiting. 02/15/15   Joseph Art, DO  polyethylene glycol (MIRALAX / GLYCOLAX) packet Take 17 g by mouth daily.    Historical Provider, MD  predniSONE (DELTASONE) 20 MG tablet Take 2 tablets (40 mg total) by mouth daily. 11/25/15   Antony Madura, PA-C  ranitidine (ZANTAC) 150 MG tablet Twice daily, morning and evening. 08/15/15   Beverley Fiedler, MD  senna (SENOKOT) 8.6 MG tablet Take 1 tablet by mouth daily.    Historical Provider, MD  torsemide (DEMADEX) 10 MG tablet Take 15 mg by mouth daily.     Historical Provider, MD  traZODone (DESYREL) 50 MG tablet Take 2 tabs by mouth at bedtime 10/04/15   Deetta Perla, MD  vitamin C (ASCORBIC ACID) 500 MG tablet  Take 500 mg by mouth daily. Reported on 09/07/2015    Historical Provider, MD    Family History Family History  Problem Relation Age of Onset  . Seizures Mother   . Seizures Sister     1 sister has  . Pancreatic cancer Sister   . Colon cancer Neg Hx   . Colon polyps Neg Hx   . Esophageal cancer Neg Hx   . Rectal cancer Neg Hx   . Stomach cancer Neg Hx     Social History Social History  Substance Use Topics  . Smoking status: Never Smoker  . Smokeless tobacco: Never Used  . Alcohol use No     Allergies   Abilify [aripiprazole]; Seroquel [quetiapine fumarate]; Amoxicillin-pot clavulanate; and Keppra [levetiracetam]   Review of Systems Review of Systems  Unable to perform ROS: Other  Respiratory: Positive for cough and wheezing.  Genitourinary:       Urinary stress and incontinence   unobtainable for review of systems, patient mentally retarded   Physical Exam Updated Vital Signs BP 111/75 (BP Location: Right Arm)   Pulse 109   Temp 98.5 F (36.9 C) (Oral)   Resp 18   SpO2 97%   Physical Exam  Constitutional: She appears well-developed and well-nourished.  HENT:  Head: Normocephalic and atraumatic.  Eyes: Conjunctivae are normal. Pupils are equal, round, and reactive to light.  Neck: Neck supple. No tracheal deviation present. No thyromegaly present.  Cardiovascular: Normal rate and regular rhythm.   No murmur heard. Pulmonary/Chest: Effort normal and breath sounds normal.  Abdominal: Soft. Bowel sounds are normal. She exhibits no distension. There is no tenderness.  Obese  Musculoskeletal: Normal range of motion. She exhibits no edema or tenderness.  Neurological: She is alert. Coordination normal.  Skin: Skin is warm and dry. No rash noted.  Psychiatric: She has a normal mood and affect.  Nursing note and vitals reviewed.    ED Treatments / Results  Labs (all labs ordered are listed, but only abnormal results are displayed) Labs Reviewed    URINALYSIS, ROUTINE W REFLEX MICROSCOPIC (NOT AT [facility]) - Abnormal; Notable for the following:       Result Value   Glucose, UA 500 (*)    Leukocytes, UA TRACE (*)    All other components within normal limits  URINE MICROSCOPIC-ADD ON - Abnormal; Notable for the following:    Squamous Epithelial / LPF 6-30 (*)    Bacteria, UA RARE (*)    All other components within normal limits  POC URINE PREG, ED    EKG  EKG Interpretation None       Radiology No results found.  Procedures Procedures (including critical care time)  Medications Ordered in ED Medications - No data to display  Chest x-ray viewed by me Results for orders placed or performed during the hospital encounter of 12/31/15  Urinalysis, Routine w reflex microscopic- may I&O cath if menses  Result Value Ref Range   Color, Urine YELLOW YELLOW   APPearance CLEAR CLEAR   Specific Gravity, Urine 1.014 1.005 - 1.030   pH 5.5 5.0 - 8.0   Glucose, UA 500 (A) NEGATIVE mg/dL   Hgb urine dipstick NEGATIVE NEGATIVE   Bilirubin Urine NEGATIVE NEGATIVE   Ketones, ur NEGATIVE NEGATIVE mg/dL   Protein, ur NEGATIVE NEGATIVE mg/dL   Nitrite NEGATIVE NEGATIVE   Leukocytes, UA TRACE (A) NEGATIVE  Urine microscopic-add on  Result Value Ref Range   Squamous Epithelial / LPF 6-30 (A) NONE SEEN   WBC, UA 6-30 0 - 5 WBC/hpf   RBC / HPF 0-5 0 - 5 RBC/hpf   Bacteria, UA RARE (A) NONE SEEN  POC urine preg, ED  Result Value Ref Range   Preg Test, Ur NEGATIVE NEGATIVE   Dg Chest 2 View  Result Date: 12/31/2015 CLINICAL DATA:  Cough. EXAM: CHEST  2 VIEW COMPARISON:  November 25, 2015 FINDINGS: No pneumothorax. The heart, hila, mediastinum, lungs, and pleura are unremarkable. IMPRESSION: No active cardiopulmonary disease. Electronically Signed   By: Gerome Samavid  Williams III M.D   On: 12/31/2015 13:19    Initial Impression / Assessment and Plan / ED Course  I have reviewed the triage vital signs and the nursing notes.  Pertinent labs &  imaging results that were available during my care of the patient were reviewed by me and considered in my medical decision making (see  chart for details).  Clinical Course     2 PM patient sleeping in bed easily arousable from supine position. No respiratory distress. I stressed with mother that she does not need further medication or antibiotic. Use albuterol nebulizer every 4 hours as needed for cough or shortness of breath. Return if needed more than 4 hours or follow up with Dr.Avbuere Final Clinical Impressions(s) / ED Diagnoses  Diagnosis asthmatic bronchitis Final diagnoses:  None    New Prescriptions New Prescriptions   No medications on file     Doug Sou, MD 12/31/15 1405

## 2015-12-31 NOTE — ED Triage Notes (Signed)
Pt here for vomiting that started yesterday. Per mom pt was seen yesterday and dx with bronchitis. sts vomited 6 times.

## 2015-12-31 NOTE — ED Provider Notes (Signed)
CSN: [id number]     Arrival date & time 12/31/15  1440 History   First MD Initiated Contact with Patient 12/31/15 1608     Chief Complaint  Patient presents with  . Emesis  . Cough   (Consider location/radiation/quality/duration/timing/severity/associated sxs/prior Treatment) HPI 20 Y/O Lori Miles, WITH COUGH CAUSING VOMITING, WHEEZING MOTHER STATES THAT SHE NEEDS MEDICATION. WAS AT ER, BUT LEFT DUE TO LONG WAIT TIMES.  SYMPTOMS HAVE BEEN PRESENT FOR A FEW DAYS.  Past Medical History:  Diagnosis Date  . Allergy   . Asthma   . Autism    mom states no actual dx, but admits she has some symptoms  . Bronchitis   . Constipation   . Frequency-urgency syndrome     some incontinence ; wears pull-ups  . Interstitial cystitis   . Mental deficiency    hx of aggressive behavior,impaired speech   . Pneumonia   . Seizure (HCC)    most recent sz " at the end of summer"-last sz years ago per mother   Past Surgical History:  Procedure Laterality Date  . CYSTOSCOPY    . CYSTOSCOPY WITH HYDRODISTENSION AND BIOPSY  04/14/2012   Procedure: CYSTOSCOPY/BIOPSY/HYDRODISTENSION;  Surgeon: Lindaann Slough, MD;  Location: [facility];  Service: Urology;;  instillation of marcaine and pyridium   Family History  Problem Relation Age of Onset  . Seizures Mother   . Seizures Sister     1 sister has  . Pancreatic cancer Sister   . Colon cancer Neg Hx   . Colon polyps Neg Hx   . Esophageal cancer Neg Hx   . Rectal cancer Neg Hx   . Stomach cancer Neg Hx    Social History  Substance Use Topics  . Smoking status: Never Smoker  . Smokeless tobacco: Never Used  . Alcohol use No   OB History    No data available     Review of Systems  Denies: HEADACHE,   ABDOMINAL PAIN, CHEST PAIN, CONGESTION, DYSURIA, SHORTNESS OF BREATH  Allergies  Abilify [aripiprazole]; Seroquel [quetiapine fumarate]; Amoxicillin-pot clavulanate; and Keppra [levetiracetam]  Home Medications   Prior to  Admission medications   Medication Sig Start Date End Date Taking? Authorizing Provider  albuterol (PROVENTIL) (2.5 MG/3ML) 0.083% nebulizer solution Nebulize one ampule every four hours as needed for cough or wheeze. 11/15/15   Jessica Priest, MD  benzonatate (TESSALON) 200 MG capsule Take 1 capsule (200 mg total) by mouth 3 (three) times daily as needed for cough. 12/18/15   Eustace Moore, MD  budesonide (PULMICORT) 0.5 MG/2ML nebulizer solution Take 2 mLs (0.5 mg total) by nebulization 2 (two) times daily. One vial twice daily 03/28/15   Baxter Hire, MD  cefdinir (OMNICEF) 300 MG capsule Take 1 capsule (300 mg total) by mouth 2 (two) times daily. 12/31/15   Tharon Aquas, PA  cetirizine (ZYRTEC) 10 MG tablet Take one tablet daily for runny nose or itching. 09/01/15   Roselyn Kara Mead, MD  cloNIDine (CATAPRES) 0.1 MG tablet Take 1+1/2  tab by mouth at bedtime. 10/04/15   Deetta Perla, MD  Cranberry 500 MG CAPS Take 500 mg by mouth daily. Reported on 09/07/2015    Historical Provider, MD  DEPAKOTE 500 MG DR tablet Take 1 tablet twice per day 10/04/15   Deetta Perla, MD  fluconazole (DIFLUCAN) 100 MG tablet TAKE 1 TABLET ONCE REPEAT IN 48 TO 72 HOURS 12/13/15   Rachelle A Denney, CNM  hydrocortisone valerate ointment (  WESTCORT) 0.2 % Apply 1 application topically 2 (two) times daily. 12/18/15   Eustace MooreLaura W Murray, MD  hyoscyamine (LEVSIN SL) 0.125 MG SL tablet DISSOLVE 1 TABLET UNDER THE TONGUE 2 TIMES A DAY 08/08/14   Historical Provider, MD  ipratropium (ATROVENT) 0.06 % nasal spray Place 2 sprays into both nostrils 4 (four) times daily. 09/18/15   Linna HoffJames D Kindl, MD  Levonorgestrel-Ethinyl Estradiol (SEASONIQUE) 0.15-0.03 &0.01 MG tablet Take 1 tablet by mouth daily. Take 1 active tablet daily, skip non active pills. 10/27/13   Antionette CharLisa Jackson-Moore, MD  Melatonin 3 MG TABS Take 3 mg by mouth at bedtime. For insomnia    Historical Provider, MD  montelukast (SINGULAIR) 10 MG tablet Take one tablet each  evening to prevent cough or wheeze. 09/07/15   Roselyn Kara MeadM Hicks, MD  Multiple Vitamin (MULTIVITAMIN WITH MINERALS) TABS tablet Take 1 tablet by mouth daily.    Historical Provider, MD  nystatin-triamcinolone (MYCOLOG II) cream Apply 1 application topically daily as needed. Reported on 08/08/2015 02/10/15   Historical Provider, MD  Olopatadine HCl 0.6 % SOLN Use one spray twice daily for stuffy nose or drainage. 03/08/15   Roselyn Kara MeadM Hicks, MD  ondansetron (ZOFRAN ODT) 8 MG disintegrating tablet Take 1 tablet (8 mg total) by mouth every 8 (eight) hours as needed for nausea or vomiting. 02/15/15   Joseph ArtJessica U Vann, DO  ondansetron (ZOFRAN) 4 MG tablet Take 1 tablet (4 mg total) by mouth every 6 (six) hours. 12/31/15   Tharon AquasFrank C Patrick, PA  polyethylene glycol [facility](MIRALAX / GLYCOLAX) packet Take 17 g by mouth daily.    Historical Provider, MD  predniSONE (DELTASONE) 10 MG tablet Take 2 tablets (20 mg total) by mouth daily. 12/31/15   Tharon AquasFrank C Patrick, PA  predniSONE (DELTASONE) 20 MG tablet Take 2 tablets (40 mg total) by mouth daily. 11/25/15   Antony MaduraKelly Humes, PA-C  ranitidine (ZANTAC) 150 MG tablet Twice daily, morning and evening. 08/15/15   Beverley FiedlerJay M Pyrtle, MD  senna (SENOKOT) 8.6 MG tablet Take 1 tablet by mouth daily.    Historical Provider, MD  torsemide (DEMADEX) 10 MG tablet Take 15 mg by mouth daily.     Historical Provider, MD  traZODone (DESYREL) 50 MG tablet Take 2 tabs by mouth at bedtime 10/04/15   Deetta PerlaWilliam H Hickling, MD  vitamin C (ASCORBIC ACID) 500 MG tablet Take 500 mg by mouth daily. Reported on 09/07/2015    Historical Provider, MD   Meds Ordered and Administered this Visit   Medications  ipratropium-albuterol (DUONEB) 0.5-2.5 (3) MG/3ML nebulizer solution 3 mL (3 mLs Nebulization Given 12/31/15 1652)    BP 90/76   Pulse 105   Temp 97.8 F (36.6 C) (Oral)   Resp 18   SpO2 99%  No data found.   Physical Exam NURSES NOTES AND VITAL SIGNS REVIEWED. CONSTITUTIONAL: Well developed, well  nourished, no acute distress HEENT: normocephalic, atraumatic EYES: Conjunctiva normal NECK:normal ROM, supple, no adenopathy PULMONARY:No respiratory distress, normal effort, DIFFUSE WHEEZE.  ABDOMINAL: Soft, ND, NT BS+, No CVAT MUSCULOSKELETAL: Normal ROM of all extremities,  SKIN: warm and dry without rash PSYCHIATRIC: Mood and affect, behavior are normal  Urgent Care Course   Clinical Course    Procedures (including critical care time)  Labs Review Labs Reviewed - No data to display  Imaging Review Dg Chest 2 View  Result Date: 12/31/2015 CLINICAL DATA:  Cough. EXAM: CHEST  2 VIEW COMPARISON:  November 25, 2015 FINDINGS: No pneumothorax. The heart, hila, mediastinum,  lungs, and pleura are unremarkable. IMPRESSION: No active cardiopulmonary disease. Electronically Signed   By: Gerome Sam III M.D   On: 12/31/2015 13:19     Visual Acuity Review  Right Eye Distance:   Left Eye Distance:   Bilateral Distance:    Right Eye Near:   Left Eye Near:    Bilateral Near:         MDM   1. Acute bronchitis, unspecified organism   2. Nausea and vomiting, intractability of vomiting not specified, unspecified vomiting type         Tharon Aquas, [place] 12/31/15 1953

## 2015-12-31 NOTE — Discharge Instructions (Signed)
Jolette can use her albuterol nebulizer every 4 hours as needed for cough or shortness of breath. Return if needed more than every 4 hours or contact Dr.Avbuere

## 2016-01-24 ENCOUNTER — Encounter (HOSPITAL_COMMUNITY): Payer: Self-pay | Admitting: *Deleted

## 2016-01-24 ENCOUNTER — Ambulatory Visit (HOSPITAL_COMMUNITY)
Admission: EM | Admit: 2016-01-24 | Discharge: 2016-01-24 | Disposition: A | Discharge status: Home / Self Care | Payer: Medicare Other | Attending: Family Medicine | Admitting: Family Medicine

## 2016-01-24 DIAGNOSIS — J0100 Acute maxillary sinusitis, unspecified: Secondary | ICD-10-CM | POA: Diagnosis not present

## 2016-01-24 MED ORDER — IPRATROPIUM BROMIDE 0.02 % IN SOLN
0.5000 mg | Freq: Once | RESPIRATORY_TRACT | Status: AC
  Administered 2016-01-24: 0.5 mg via RESPIRATORY_TRACT

## 2016-01-24 MED ORDER — AZITHROMYCIN 250 MG PO TABS: ORAL_TABLET | ORAL | 0 refills | Status: DC

## 2016-01-24 MED ORDER — PREDNISONE 50 MG PO TABS: ORAL_TABLET | ORAL | 0 refills | Status: DC

## 2016-01-24 MED ORDER — ALBUTEROL SULFATE (2.5 MG/3ML) 0.083% IN NEBU
5.0000 mg | INHALATION_SOLUTION | Freq: Once | RESPIRATORY_TRACT | Status: AC
  Administered 2016-01-24: 5 mg via RESPIRATORY_TRACT

## 2016-01-24 MED ORDER — ALBUTEROL SULFATE (2.5 MG/3ML) 0.083% IN NEBU
INHALATION_SOLUTION | RESPIRATORY_TRACT | Status: AC
  Filled 2016-01-24: qty 3

## 2016-01-24 MED ORDER — IPRATROPIUM BROMIDE 0.02 % IN SOLN
RESPIRATORY_TRACT | Status: AC
  Filled 2016-01-24: qty 2.5

## 2016-01-24 MED ORDER — CEFDINIR 300 MG PO CAPS: 300.0000 mg | ORAL_CAPSULE | Freq: Two times a day (BID) | ORAL | 0 refills | Status: DC

## 2016-01-24 NOTE — ED Provider Notes (Signed)
[facility]    CSN: [id number] Arrival date & time: 01/24/16  1511     History   Chief Complaint Chief Complaint  Patient presents with  . Cough    HPI Lori Miles is a 20 y.o. female.   The history is provided by the patient and a parent.  Cough  Cough characteristics:  Non-productive and hacking Sputum characteristics:  White Severity:  Mild Onset quality:  Gradual Duration:  3 days Chronicity:  New Smoker: no   Context: sick contacts   Context comment:  Recent uri, Associated symptoms: rhinorrhea   Associated symptoms: no fever     Past Medical History:  Diagnosis Date  . Allergy   . Asthma   . Autism    mom states no actual dx, but admits she has some symptoms  . Bronchitis   . Constipation   . Frequency-urgency syndrome     some incontinence ; wears pull-ups  . Interstitial cystitis   . Mental deficiency    hx of aggressive behavior,impaired speech   . Pneumonia   . Seizure (HCC)    most recent sz " at the end of summer"-last sz years ago per mother    Patient Active Problem List   Diagnosis Date Noted  . Insomnia 10/04/2015  . Acute bronchitis 05/05/2015  . Dyspnea 05/05/2015  . Gait disorder 03/22/2015  . 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 Cough variant asthma vs UACS/ vcd  03/15/2015  . CAP (community acquired pneumonia) 02/14/2015  . Autism spectrum disorder 02/14/2015  . Acute respiratory failure (HCC) 02/14/2015  . Nausea & vomiting 02/14/2015  . Disturbance in sleep behavior 12/13/2014  . Obesity, morbid (HCC) 12/13/2014  . 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 Acanthosis nigricans 11/14/2014  . Toeing-in 08/29/2014  . Obesity (BMI 30-39.9) 08/23/2013  . 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 Partial epilepsy with impairment of consciousness (HCC)  09/28/2012  . 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 Interstitial cystitis 04/28/2012  . Recurrent urinary tract infection 08/01/2011  . Asthma 08/01/2011  . Chronic constipation 08/01/2011  . Contraception 08/01/2011    Past Surgical History:  Procedure Laterality Date  . CYSTOSCOPY    . CYSTOSCOPY WITH HYDRODISTENSION AND BIOPSY  04/14/2012   Procedure: CYSTOSCOPY/BIOPSY/HYDRODISTENSION;  Surgeon: Lindaann SloughMarc-Henry Nesi, MD;  Location: [facility];  Service: Urology;;  instillation of marcaine and pyridium    OB History    No data available       Home Medications    Prior to Admission medications   Medication Sig Start Date End Date Taking? Authorizing Provider  albuterol (PROVENTIL) (2.5 MG/3ML) 0.083% nebulizer solution Nebulize one ampule every four hours as needed for cough or wheeze. 11/15/15   Jessica PriestEric J Kozlow, MD  benzonatate (TESSALON) 200 MG capsule Take 1 capsule (200 mg total) by mouth 3 (three) times daily as needed for cough. 12/18/15   Eustace MooreLaura W Murray, MD  budesonide (PULMICORT) 0.5 MG/2ML nebulizer solution Take 2 mLs (0.5 mg total) by nebulization 2 (two) times daily. One vial twice daily 03/28/15   Baxter Hireoselyn M Hicks, MD  cefdinir (OMNICEF) 300 MG capsule Take 1 capsule (300 mg total) by mouth 2 (two) times daily. 12/31/15   Tharon AquasFrank C Patrick, PA  cetirizine (ZYRTEC) 10 MG tablet Take one tablet daily for runny nose or itching. 09/01/15   Roselyn Kara MeadM Hicks, MD  cloNIDine (CATAPRES) 0.1 MG tablet Take 1+1/2  tab by mouth at bedtime. 10/04/15   Deetta Perla, MD  Cranberry 500 MG CAPS Take 500 mg by mouth daily. Reported on 09/07/2015    Historical Provider, MD  DEPAKOTE 500 MG DR tablet Take 1 tablet twice per day 10/04/15   Deetta Perla, MD  fluconazole (DIFLUCAN) 100 MG tablet TAKE 1 TABLET ONCE REPEAT IN 48 TO 72 HOURS 12/13/15   Rachelle A Denney, CNM  hydrocortisone valerate ointment (WESTCORT) 0.2 % Apply 1 application topically 2 (two) times daily.  12/18/15   Eustace Moore, MD  hyoscyamine (LEVSIN SL) 0.125 MG SL tablet DISSOLVE 1 TABLET UNDER THE TONGUE 2 TIMES A DAY 08/08/14   Historical Provider, MD  ipratropium (ATROVENT) 0.06 % nasal spray Place 2 sprays into both nostrils 4 (four) times daily. 09/18/15   Linna Hoff, MD  Levonorgestrel-Ethinyl Estradiol (SEASONIQUE) 0.15-0.03 &0.01 MG tablet Take 1 tablet by mouth daily. Take 1 active tablet daily, skip non active pills. 10/27/13   Antionette Char, MD  Melatonin 3 MG TABS Take 3 mg by mouth at bedtime. For insomnia    Historical Provider, MD  montelukast (SINGULAIR) 10 MG tablet Take one tablet each evening to prevent cough or wheeze. 09/07/15   Roselyn Kara Mead, MD  Multiple Vitamin (MULTIVITAMIN WITH MINERALS) TABS tablet Take 1 tablet by mouth daily.    Historical Provider, MD  nystatin-triamcinolone (MYCOLOG II) cream Apply 1 application topically daily as needed. Reported on 08/08/2015 02/10/15   Historical Provider, MD  Olopatadine HCl 0.6 % SOLN Use one spray twice daily for stuffy nose or drainage. 03/08/15   Roselyn Kara Mead, MD  ondansetron (ZOFRAN ODT) 8 MG disintegrating tablet Take 1 tablet (8 mg total) by mouth every 8 (eight) hours as needed for nausea or vomiting. 02/15/15   Joseph Art, DO  ondansetron (ZOFRAN) 4 MG tablet Take 1 tablet (4 mg total) by mouth every 6 (six) hours. 12/31/15   Tharon Aquas, PA  polyethylene glycol [facility] / GLYCOLAX) packet Take 17 g by mouth daily.    Historical Provider, MD  predniSONE (DELTASONE) 10 MG tablet Take 2 tablets (20 mg total) by mouth daily. 12/31/15   Tharon Aquas, PA  predniSONE (DELTASONE) 20 MG tablet Take 2 tablets (40 mg total) by mouth daily. 11/25/15   Antony Madura, PA-C  ranitidine (ZANTAC) 150 MG tablet Twice daily, morning and evening. 08/15/15   Beverley Fiedler, MD  senna (SENOKOT) 8.6 MG tablet Take 1 tablet by mouth daily.    Historical Provider, MD  torsemide (DEMADEX) 10 MG tablet Take 15 mg by mouth daily.      Historical Provider, MD  traZODone (DESYREL) 50 MG tablet Take 2 tabs by mouth at bedtime 10/04/15   Deetta Perla, MD  vitamin C (ASCORBIC ACID) 500 MG tablet Take 500 mg by mouth daily. Reported on 09/07/2015    Historical Provider, MD    Family History Family History  Problem Relation Age of Onset  . Seizures Mother   . Seizures Sister     1 sister has  . Pancreatic cancer Sister   . Colon cancer Neg Hx   . Colon polyps Neg Hx   . Esophageal cancer Neg Hx   . Rectal cancer Neg Hx   . Stomach cancer Neg Hx     Social History Social History  Substance Use Topics  . Smoking status: Never Smoker  . Smokeless tobacco: Never  Used  . Alcohol use No     Allergies   Abilify [aripiprazole]; Seroquel [quetiapine fumarate]; Amoxicillin-pot clavulanate; and Keppra [levetiracetam]   Review of Systems Review of Systems  Constitutional: Negative.  Negative for fever.  HENT: Positive for congestion, postnasal drip and rhinorrhea.   Respiratory: Positive for cough.   Cardiovascular: Negative.   Gastrointestinal: Negative.   All other systems reviewed and are negative.    Physical Exam Triage Vital Signs ED Triage Vitals  Enc Vitals Group     BP 01/24/16 1532 (!) 111/54     Pulse Rate 01/24/16 1532 111     Resp 01/24/16 1532 18     Temp 01/24/16 1532 98.8 F (37.1 C)     Temp Source 01/24/16 1532 Oral     SpO2 01/24/16 1532 100 %     Weight --      Height --      Head Circumference --      Peak Flow --      Pain Score 01/24/16 1535 8     Pain Loc --      Pain Edu? --      Excl. in GC? --    No data found.   Updated Vital Signs BP (!) 111/54 (BP Location: Left Arm)   Pulse 111   Temp 98.8 F (37.1 C) (Oral)   Resp 18   SpO2 100%   Visual Acuity Right Eye Distance:   Left Eye Distance:   Bilateral Distance:    Right Eye Near:   Left Eye Near:    Bilateral Near:     Physical Exam  Constitutional: She appears well-developed and well-nourished. No  distress.  HENT:  Right Ear: External ear normal.  Left Ear: External ear normal.  Nursing note and vitals reviewed.    UC Treatments / Results  Labs (all labs ordered are listed, but only abnormal results are displayed) Labs Reviewed - No data to display  EKG  EKG Interpretation None       Radiology No results found.  Procedures Procedures (including critical care time)  Medications Ordered in UC Medications  albuterol (PROVENTIL) (2.5 MG/3ML) 0.083% nebulizer solution 5 mg (not administered)  ipratropium (ATROVENT) nebulizer solution 0.5 mg (not administered)     Initial Impression / Assessment and Plan / UC Course  I have reviewed the triage vital signs and the nursing notes.  Pertinent labs & imaging results that were available during my care of the patient were reviewed by me and considered in my medical decision making (see chart for details).  Clinical Course       Final Clinical Impressions(s) / UC Diagnoses   Final diagnoses:  None    New Prescriptions New Prescriptions   No medications on file     Linna HoffJames D Layden Caterino, MD 01/24/16 [phone]

## 2016-01-24 NOTE — Discharge Instructions (Signed)
Drink plenty of fluids as discussed, use medicine as prescribed, and mucinex or delsym for cough. Return or see your doctor if further problems °

## 2016-01-24 NOTE — ED Triage Notes (Signed)
Pt  Reports      Cough   Congested    Sinus  Drainage   With  Symptoms   X  sev  Days   Pt  Has  Asthma  Just  Got  Over  A   Sinus    Infection       sev  Weeks  Ago

## 2016-01-25 ENCOUNTER — Emergency Department (HOSPITAL_COMMUNITY)
Admission: EM | Admit: 2016-01-25 | Discharge: 2016-01-25 | Disposition: A | Discharge status: Home / Self Care | Payer: Medicare Other | Attending: Emergency Medicine | Admitting: Emergency Medicine

## 2016-01-25 ENCOUNTER — Encounter (HOSPITAL_COMMUNITY): Payer: Self-pay | Admitting: Emergency Medicine

## 2016-01-25 ENCOUNTER — Emergency Department (HOSPITAL_COMMUNITY): Payer: Medicare Other

## 2016-01-25 DIAGNOSIS — R05 Cough: Secondary | ICD-10-CM | POA: Diagnosis not present

## 2016-01-25 DIAGNOSIS — J45909 Unspecified asthma, uncomplicated: Secondary | ICD-10-CM | POA: Diagnosis not present

## 2016-01-25 DIAGNOSIS — J069 Acute upper respiratory infection, unspecified: Secondary | ICD-10-CM

## 2016-01-25 DIAGNOSIS — Z79899 Other long term (current) drug therapy: Secondary | ICD-10-CM | POA: Insufficient documentation

## 2016-01-25 DIAGNOSIS — R51 Headache: Secondary | ICD-10-CM | POA: Diagnosis not present

## 2016-01-25 DIAGNOSIS — F840 Autistic disorder: Secondary | ICD-10-CM | POA: Diagnosis not present

## 2016-01-25 LAB — RAPID STREP SCREEN (MED CTR MEBANE ONLY): STREPTOCOCCUS, GROUP A SCREEN (DIRECT): NEGATIVE

## 2016-01-25 MED ORDER — ALBUTEROL SULFATE HFA 108 (90 BASE) MCG/ACT IN AERS
2.0000 | INHALATION_SPRAY | RESPIRATORY_TRACT | Status: DC | PRN
  Administered 2016-01-25: 2 via RESPIRATORY_TRACT
  Filled 2016-01-25: qty 6.7

## 2016-01-25 NOTE — ED Notes (Signed)
Mother asked nurse to ask provider medication for wheezing. Provider notified.

## 2016-01-25 NOTE — ED Provider Notes (Signed)
[facility]   CSN: [id number] Arrival date & time: 01/25/16  1010     History   Chief Complaint Chief Complaint  Patient presents with  . Sore Throat  . Cough    HPI Lori SchickJasmine Miles is a 20 y.o. female.  HPI   20 year old autistic female with history of asthma and allergy and prior diagnosis of pneumonia brought in by mom for evaluation of cold symptoms. Past 2 days patient has had congestion, occasional cough, decreased appetite, subjective fever. She was seen at urgent care yesterday for the symptoms. She was discharged with prednisone, as well as cefdinir antibiotic. Mom brought patient in the ED today because she is concerned the patient did not receive a chest x-ray since she had pneumonia in the past. She is requesting for chest x-ray. Otherwise no other complaint. Patient has been eating.  Past Medical History:  Diagnosis Date  . Allergy   . Asthma   . Autism    mom states no actual dx, but admits she has some symptoms  . Bronchitis   . Constipation   . Frequency-urgency syndrome     some incontinence ; wears pull-ups  . Interstitial cystitis   . Mental deficiency    hx of aggressive behavior,impaired speech   . Pneumonia   . Seizure (HCC)    most recent sz " at the end of summer"-last sz years ago per mother    Patient Active Problem List   Diagnosis Date Noted  . Insomnia 10/04/2015  . Acute bronchitis 05/05/2015  . Dyspnea 05/05/2015  . Gait disorder 03/22/2015  . 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 Cough variant asthma vs UACS/ vcd  03/15/2015  . CAP (community acquired pneumonia) 02/14/2015  . Autism spectrum disorder 02/14/2015  . Acute respiratory failure (HCC) 02/14/2015  . Nausea & vomiting 02/14/2015  . Disturbance in sleep behavior 12/13/2014  . Obesity, morbid (HCC) 12/13/2014  . 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 Acanthosis nigricans 11/14/2014  . Toeing-in 08/29/2014  . Obesity (BMI 30-39.9) 08/23/2013  . 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 Interstitial cystitis 04/28/2012  . Recurrent urinary tract infection 08/01/2011  . Asthma 08/01/2011  . Chronic constipation 08/01/2011  . Contraception 08/01/2011    Past Surgical History:  Procedure Laterality Date  . CYSTOSCOPY    . CYSTOSCOPY WITH HYDRODISTENSION AND BIOPSY  04/14/2012   Procedure: CYSTOSCOPY/BIOPSY/HYDRODISTENSION;  Surgeon: Lindaann SloughMarc-Henry Nesi, MD;  Location: [facility];  Service: Urology;;  instillation of marcaine and pyridium    OB History    No data available       Home Medications    Prior to Admission medications   Medication Sig Start Date End Date Taking? Authorizing Provider  albuterol (PROVENTIL) (2.5 MG/3ML) 0.083% nebulizer solution Nebulize one ampule every four hours as needed for cough or wheeze. 11/15/15  Yes Jessica PriestEric J Kozlow, MD  budesonide (PULMICORT) 0.5 MG/2ML nebulizer solution Take 2 mLs (0.5 mg total) by nebulization 2 (two) times daily. One vial twice daily 03/28/15  Yes Roselyn Kara MeadM Hicks, MD  cefdinir (OMNICEF) 300 MG capsule Take 1 capsule (300 mg total) by mouth 2 (two) times daily. 01/24/16  Yes Linna HoffJames D Kindl, MD  predniSONE (DELTASONE) 50 MG tablet 1 tab daily for 2 days  then 1/2 tab daily for 2 days. Patient taking differently: Take 25-50 mg by mouth See admin instructions. Take 50 mg by mouth daily for 2 days then 25 mg by mouth daily for 2 days. 01/24/16  Yes Linna Hoff, MD  benzonatate (TESSALON) 200 MG capsule Take 1 capsule (200 mg total) by mouth 3 (three) times daily as needed for cough. Patient not taking: Reported on 01/25/2016 12/18/15   Eustace Moore, MD  cetirizine (ZYRTEC) 10 MG tablet Take one tablet daily  for runny nose or itching. 09/01/15   Roselyn Kara Mead, MD  cloNIDine (CATAPRES) 0.1 MG tablet Take 1+1/2  tab by mouth at bedtime. 10/04/15   Deetta Perla, MD  Cranberry 500 MG CAPS Take 500 mg by mouth daily. Reported on 09/07/2015    Historical Provider, MD  DEPAKOTE 500 MG DR tablet Take 1 tablet twice per day 10/04/15   Deetta Perla, MD  fluconazole (DIFLUCAN) 100 MG tablet TAKE 1 TABLET ONCE REPEAT IN 48 TO 72 HOURS 12/13/15   Rachelle A Denney, CNM  hydrocortisone valerate ointment (WESTCORT) 0.2 % Apply 1 application topically 2 (two) times daily. 12/18/15   Eustace Moore, MD  hyoscyamine (LEVSIN SL) 0.125 MG SL tablet DISSOLVE 1 TABLET UNDER THE TONGUE 2 TIMES A DAY 08/08/14   Historical Provider, MD  ipratropium (ATROVENT) 0.06 % nasal spray Place 2 sprays into both nostrils 4 (four) times daily. 09/18/15   Linna Hoff, MD  Levonorgestrel-Ethinyl Estradiol (SEASONIQUE) 0.15-0.03 &0.01 MG tablet Take 1 tablet by mouth daily. Take 1 active tablet daily, skip non active pills. 10/27/13   Antionette Char, MD  Melatonin 3 MG TABS Take 3 mg by mouth at bedtime. For insomnia    Historical Provider, MD  montelukast (SINGULAIR) 10 MG tablet Take one tablet each evening to prevent cough or wheeze. 09/07/15   Roselyn Kara Mead, MD  Multiple Vitamin (MULTIVITAMIN WITH MINERALS) TABS tablet Take 1 tablet by mouth daily.    Historical Provider, MD  nystatin-triamcinolone (MYCOLOG II) cream Apply 1 application topically daily as needed. Reported on 08/08/2015 02/10/15   Historical Provider, MD  Olopatadine HCl 0.6 % SOLN Use one spray twice daily for stuffy nose or drainage. 03/08/15   Roselyn Kara Mead, MD  ondansetron (ZOFRAN ODT) 8 MG disintegrating tablet Take 1 tablet (8 mg total) by mouth every 8 (eight) hours as needed for nausea or vomiting. 02/15/15   Joseph Art, DO  ondansetron (ZOFRAN) 4 MG tablet Take 1 tablet (4 mg total) by mouth every 6 (six) hours. 12/31/15   Tharon Aquas, PA    polyethylene glycol [facility] / GLYCOLAX) packet Take 17 g by mouth daily.    Historical Provider, MD  ranitidine (ZANTAC) 150 MG tablet Twice daily, morning and evening. 08/15/15   Beverley Fiedler, MD  senna (SENOKOT) 8.6 MG tablet Take 1 tablet by mouth daily.    Historical Provider, MD  torsemide (DEMADEX) 10 MG tablet Take 15 mg by mouth daily.     Historical Provider, MD  traZODone (DESYREL) 50 MG tablet Take 2 tabs by mouth at bedtime 10/04/15   Deetta Perla, MD  vitamin C (ASCORBIC ACID) 500 MG tablet Take 500 mg by mouth daily. Reported on 09/07/2015    Historical Provider, MD    Family History Family History  Problem Relation Age of Onset  . Seizures Mother   . Seizures Sister     1 sister has  . Pancreatic cancer  Sister   . Colon cancer Neg Hx   . Colon polyps Neg Hx   . Esophageal cancer Neg Hx   . Rectal cancer Neg Hx   . Stomach cancer Neg Hx     Social History Social History  Substance Use Topics  . Smoking status: Never Smoker  . Smokeless tobacco: Never Used  . Alcohol use No     Allergies   Abilify [aripiprazole]; Seroquel [quetiapine fumarate]; Amoxicillin-pot clavulanate; and Keppra [levetiracetam]   Review of Systems Review of Systems  Constitutional: Positive for fever.  HENT: Positive for sore throat.   Respiratory: Negative for shortness of breath.   Gastrointestinal: Negative for abdominal pain.  Neurological: Positive for headaches.  All other systems reviewed and are negative.    Physical Exam Updated Vital Signs BP 129/67 (BP Location: Right Arm)   Pulse 118   Temp 98.2 F (36.8 C) (Oral)   Resp 26   Ht 4' 9.5" (1.461 m)   Wt 83.9 kg   SpO2 100%   BMI 39.34 kg/m   Physical Exam  Constitutional: She appears well-developed and well-nourished. No distress.  Patient is sitting in the bed, playing on the ipad and in no acute discomfort, smiling occasionally.  HENT:  Head: Atraumatic.  Right Ear: External ear normal.  Left Ear:  External ear normal.  Mouth/Throat: Oropharynx is clear and moist.  Eyes: Conjunctivae are normal.  Neck: Neck supple.  Cardiovascular: Normal rate and regular rhythm.   Pulmonary/Chest: Effort normal and breath sounds normal.  Abdominal: Soft. There is no tenderness.  Neurological: She is alert.  Skin: No rash noted.  Psychiatric: She has a normal mood and affect.  Nursing note and vitals reviewed.    ED Treatments / Results  Labs (all labs ordered are listed, but only abnormal results are displayed) Labs Reviewed  RAPID STREP SCREEN (NOT [facility])  CULTURE, GROUP A STREP [facility]    EKG  EKG Interpretation None       Radiology Dg Chest 2 View  Result Date: 01/25/2016 CLINICAL DATA:  Cough and wheezing EXAM: CHEST  2 VIEW COMPARISON:  December 31, 2015 FINDINGS: Lungs clear. Heart size and pulmonary vascularity are normal. No adenopathy. No bone lesions. IMPRESSION: No edema or consolidation. Electronically Signed   By: Bretta BangWilliam  Woodruff III M.D.   On: 01/25/2016 11:45    Procedures Procedures (including critical care time)  Medications Ordered in ED Medications - No data to display   Initial Impression / Assessment and Plan / ED Course  I have reviewed the triage vital signs and the nursing notes.  Pertinent labs & imaging results that were available during my care of the patient were reviewed by me and considered in my medical decision making (see chart for details).  Clinical Course     BP 120/70   Pulse 116   Temp 98.2 F (36.8 C) (Oral)   Resp 20   Ht 4' 9.5" (1.461 m)   Wt 83.9 kg   SpO2 100%   BMI 39.34 kg/m    Final Clinical Impressions(s) / ED Diagnoses   Final diagnoses:  Viral upper respiratory tract infection    New Prescriptions New Prescriptions   No medications on file   1:23 PM Symptoms consistence with a viral URI. Chest x-ray negative for focal infiltrate concerning for pneumonia, strep test is negative, throat culture sent.  Reassurance given.   Fayrene HelperBowie Kyo Cocuzza, PA-C 01/25/16 1323    Arby BarretteMarcy Pfeiffer, MD 01/30/16 [phone]

## 2016-01-25 NOTE — ED Triage Notes (Signed)
Pt from home with c/o sore throat and cough x 2 days.  [facility].  Mother is concerned for pneumonia with hx of the same 8 months ago.  Pt in NAD, A&O.

## 2016-01-28 ENCOUNTER — Emergency Department (HOSPITAL_COMMUNITY)
Admission: EM | Admit: 2016-01-28 | Discharge: 2016-01-29 | Disposition: A | Discharge status: Home / Self Care | Payer: Medicare Other | Attending: Emergency Medicine | Admitting: Emergency Medicine

## 2016-01-28 ENCOUNTER — Encounter (HOSPITAL_COMMUNITY): Payer: Self-pay | Admitting: Emergency Medicine

## 2016-01-28 DIAGNOSIS — N281 Cyst of kidney, acquired: Secondary | ICD-10-CM | POA: Diagnosis not present

## 2016-01-28 DIAGNOSIS — R112 Nausea with vomiting, unspecified: Secondary | ICD-10-CM | POA: Diagnosis not present

## 2016-01-28 DIAGNOSIS — R6889 Other general symptoms and signs: Secondary | ICD-10-CM

## 2016-01-28 DIAGNOSIS — R05 Cough: Secondary | ICD-10-CM | POA: Diagnosis present

## 2016-01-28 DIAGNOSIS — Z79899 Other long term (current) drug therapy: Secondary | ICD-10-CM | POA: Diagnosis not present

## 2016-01-28 DIAGNOSIS — J45909 Unspecified asthma, uncomplicated: Secondary | ICD-10-CM | POA: Insufficient documentation

## 2016-01-28 DIAGNOSIS — N3289 Other specified disorders of bladder: Secondary | ICD-10-CM | POA: Diagnosis not present

## 2016-01-28 DIAGNOSIS — J111 Influenza due to unidentified influenza virus with other respiratory manifestations: Secondary | ICD-10-CM | POA: Diagnosis not present

## 2016-01-28 LAB — URINALYSIS, ROUTINE W REFLEX MICROSCOPIC
Glucose, UA: NEGATIVE mg/dL
HGB URINE DIPSTICK: NEGATIVE
Ketones, ur: NEGATIVE mg/dL
Nitrite: NEGATIVE
PH: 5.5 (ref 5.0–8.0)
Protein, ur: NEGATIVE mg/dL
SPECIFIC GRAVITY, URINE: 1.04 — AB (ref 1.005–1.030)

## 2016-01-28 LAB — CBC WITH DIFFERENTIAL/PLATELET
Basophils Absolute: 0 10*3/uL (ref 0.0–0.1)
Basophils Relative: 0 %
EOS ABS: 0 10*3/uL (ref 0.0–0.7)
EOS PCT: 0 %
HCT: 38.6 % (ref 36.0–46.0)
HEMOGLOBIN: 12.9 g/dL (ref 12.0–15.0)
LYMPHS ABS: 5.4 10*3/uL — AB (ref 0.7–4.0)
LYMPHS PCT: 32 %
MCH: 30.3 pg (ref 26.0–34.0)
MCHC: 33.4 g/dL (ref 30.0–36.0)
MCV: 90.6 fL (ref 78.0–100.0)
MONOS PCT: 9 %
Monocytes Absolute: 1.4 10*3/uL — ABNORMAL HIGH (ref 0.1–1.0)
Neutro Abs: 10 10*3/uL — ABNORMAL HIGH (ref 1.7–7.7)
Neutrophils Relative %: 59 %
PLATELETS: 342 10*3/uL (ref 150–400)
RBC: 4.26 MIL/uL (ref 3.87–5.11)
RDW: 14.2 % (ref 11.5–15.5)
WBC: 16.8 10*3/uL — ABNORMAL HIGH (ref 4.0–10.5)

## 2016-01-28 LAB — CULTURE, GROUP A STREP (THRC)

## 2016-01-28 LAB — URINE MICROSCOPIC-ADD ON

## 2016-01-28 LAB — BASIC METABOLIC PANEL
Anion gap: 15 (ref 5–15)
BUN: 19 mg/dL (ref 6–20)
CHLORIDE: 103 mmol/L (ref 101–111)
CO2: 21 mmol/L — ABNORMAL LOW (ref 22–32)
CREATININE: 1.29 mg/dL — AB (ref 0.44–1.00)
Calcium: 9.3 mg/dL (ref 8.9–10.3)
GFR calc Af Amer: >60 mL/min (ref >60)
GFR calc non Af Amer: 59 mL/min — ABNORMAL LOW (ref >60)
GLUCOSE: 158 mg/dL — AB (ref 65–99)
POTASSIUM: 3.2 mmol/L — AB (ref 3.5–5.1)
SODIUM: 139 mmol/L (ref 135–145)

## 2016-01-28 LAB — POC URINE PREG, ED: PREG TEST UR: NEGATIVE

## 2016-01-28 MED ORDER — ONDANSETRON HCL 4 MG/2ML IJ SOLN
4.0000 mg | Freq: Once | INTRAMUSCULAR | Status: AC
  Administered 2016-01-28: 4 mg via INTRAVENOUS
  Filled 2016-01-28: qty 2

## 2016-01-28 MED ORDER — SODIUM CHLORIDE 0.9 % IV SOLN
INTRAVENOUS | Status: DC
Start: 2016-01-28 — End: 2016-01-29
  Administered 2016-01-28: 22:00:00 via INTRAVENOUS

## 2016-01-28 MED ORDER — IPRATROPIUM-ALBUTEROL 0.5-2.5 (3) MG/3ML IN SOLN
3.0000 mL | Freq: Once | RESPIRATORY_TRACT | Status: AC
  Administered 2016-01-28: 3 mL via RESPIRATORY_TRACT
  Filled 2016-01-28: qty 3

## 2016-01-28 NOTE — ED Provider Notes (Signed)
[facility]-EMERGENCY DEPT Provider Note   CSN: [id number] Arrival date & time: 01/28/16  2058   By signing my name below, I, Lori Miles, attest that this documentation has been prepared under the direction and in the presence of [facility]. Electronically Signed: Clarisse Miles, Scribe. 01/29/16. 1:55 AM.   History   Chief Complaint Chief Complaint  Patient presents with  . Cough  . URI   The history is provided by a parent. No language interpreter was used.   HPI Comments: Lori Miles is a 20 y.o. female who presents to the Emergency Department complaining of gradual onset, gradual worsening, constant cold-like symptoms x 3 weeks. Mother reports bronchitis last week and sinus infection the week before. She reports associated rhinorrhea, fever and chills, blood vomit, productive (green sputum) cough, ear ache, sore throat and congestion. Pt was seen at [facility] this past week for similar symptoms. Pt is currently taking omnicef, pulmicort and prednisone for symptoms with little relief. Mother further reports administering albuterol treatments at home with minimal relief. Chest x-ray 2 days ago was normal.   Past Medical History:  Diagnosis Date  . Allergy   . Asthma   . Autism    mom states no actual dx, but admits she has some symptoms  . Bronchitis   . Constipation   . Frequency-urgency syndrome     some incontinence ; wears pull-ups  . Interstitial cystitis   . Mental deficiency    hx of aggressive behavior,impaired speech   . Pneumonia   . Seizure (HCC)    most recent sz " at the end of summer"-last sz years ago per mother    Patient Active Problem List   Diagnosis Date Noted  . Insomnia 10/04/2015  . Acute bronchitis 05/05/2015  . Dyspnea 05/05/2015  . Gait disorder 03/22/2015  . 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 Cough variant asthma vs UACS/ vcd  03/15/2015  . CAP (community acquired pneumonia) 02/14/2015  . Autism spectrum disorder 02/14/2015  . Acute  respiratory failure (HCC) 02/14/2015  . Nausea & vomiting 02/14/2015  . Disturbance in sleep behavior 12/13/2014  . Obesity, morbid (HCC) 12/13/2014  . 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 Acanthosis nigricans 11/14/2014  . Toeing-in 08/29/2014  . Obesity (BMI 30-39.9) 08/23/2013  . 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 Interstitial cystitis 04/28/2012  . Recurrent urinary tract infection 08/01/2011  . Asthma 08/01/2011  . Chronic constipation 08/01/2011  . Contraception 08/01/2011    Past Surgical History:  Procedure Laterality Date  . CYSTOSCOPY    . CYSTOSCOPY WITH HYDRODISTENSION AND BIOPSY  04/14/2012   Procedure: CYSTOSCOPY/BIOPSY/HYDRODISTENSION;  Surgeon: Lindaann Slough, MD;  Location: [facility];  Service: Urology;;  instillation of marcaine and pyridium    OB History    No data available       Home Medications    Prior to Admission medications   Medication Sig Start Date End Date Taking? Authorizing Provider  albuterol (PROVENTIL) (2.5 MG/3ML) 0.083% nebulizer solution Nebulize one ampule every four hours as needed for cough or wheeze. 11/15/15   Jessica Priest, MD  benzonatate (TESSALON) 200 MG capsule Take 1 capsule (200 mg total) by mouth 3 (three) times daily as needed for cough. Patient not taking: Reported on 01/25/2016 12/18/15  Eustace MooreLaura W Murray, MD  budesonide (PULMICORT) 0.5 MG/2ML nebulizer solution Take 2 mLs (0.5 mg total) by nebulization 2 (two) times daily. One vial twice daily 03/28/15   Baxter Hireoselyn M Hicks, MD  cefdinir (OMNICEF) 300 MG capsule Take 1 capsule (300 mg total) by mouth 2 (two) times daily. 01/24/16   Linna HoffJames D Kindl, MD  cetirizine (ZYRTEC) 10 MG tablet Take one  tablet daily for runny nose or itching. 09/01/15   Roselyn Kara MeadM Hicks, MD  cloNIDine (CATAPRES) 0.1 MG tablet Take 1+1/2  tab by mouth at bedtime. 10/04/15   Deetta PerlaWilliam H Hickling, MD  Cranberry 500 MG CAPS Take 500 mg by mouth daily. Reported on 09/07/2015    Historical Provider, MD  DEPAKOTE 500 MG DR tablet Take 1 tablet twice per day 10/04/15   Deetta PerlaWilliam H Hickling, MD  fluconazole (DIFLUCAN) 100 MG tablet TAKE 1 TABLET ONCE REPEAT IN 48 TO 72 HOURS 12/13/15   Rachelle A Denney, CNM  hydrocortisone valerate ointment (WESTCORT) 0.2 % Apply 1 application topically 2 (two) times daily. 12/18/15   Eustace MooreLaura W Murray, MD  hyoscyamine (LEVSIN SL) 0.125 MG SL tablet DISSOLVE 1 TABLET UNDER THE TONGUE 2 TIMES A DAY 08/08/14   Historical Provider, MD  ipratropium (ATROVENT) 0.06 % nasal spray Place 2 sprays into both nostrils 4 (four) times daily. 09/18/15   Linna HoffJames D Kindl, MD  Levonorgestrel-Ethinyl Estradiol (SEASONIQUE) 0.15-0.03 &0.01 MG tablet Take 1 tablet by mouth daily. Take 1 active tablet daily, skip non active pills. 10/27/13   Antionette CharLisa Jackson-Moore, MD  Melatonin 3 MG TABS Take 3 mg by mouth at bedtime. For insomnia    Historical Provider, MD  montelukast (SINGULAIR) 10 MG tablet Take one tablet each evening to prevent cough or wheeze. 09/07/15   Roselyn Kara MeadM Hicks, MD  Multiple Vitamin (MULTIVITAMIN WITH MINERALS) TABS tablet Take 1 tablet by mouth daily.    Historical Provider, MD  nystatin-triamcinolone (MYCOLOG II) cream Apply 1 application topically daily as needed. Reported on 08/08/2015 02/10/15   Historical Provider, MD  Olopatadine HCl 0.6 % SOLN Use one spray twice daily for stuffy nose or drainage. 03/08/15   Roselyn Kara MeadM Hicks, MD  ondansetron (ZOFRAN ODT) 8 MG disintegrating tablet Take 1 tablet (8 mg total) by mouth every 8 (eight) hours as needed for nausea or vomiting. 02/15/15   Joseph ArtJessica U Vann, DO  ondansetron (ZOFRAN) 4 MG tablet Take 1 tablet (4 mg total) by mouth every 6 (six) hours. 12/31/15   Tharon AquasFrank C  Patrick, PA  polyethylene glycol [facility](MIRALAX / GLYCOLAX) packet Take 17 g by mouth daily.    Historical Provider, MD  predniSONE (DELTASONE) 10 MG tablet Take 2 tablets (20 mg total) by mouth daily. 01/29/16   Hope Orlene OchM Neese, NP  ranitidine (ZANTAC) 150 MG tablet Twice daily, morning and evening. 08/15/15   Beverley FiedlerJay M Pyrtle, MD  senna (SENOKOT) 8.6 MG tablet Take 1 tablet by mouth daily.    Historical Provider, MD  torsemide (DEMADEX) 10 MG tablet Take 15 mg by mouth daily.     Historical Provider, MD  traZODone (DESYREL) 50 MG tablet Take 2 tabs by mouth at bedtime 10/04/15   Deetta PerlaWilliam H Hickling, MD  vitamin C (ASCORBIC ACID) 500 MG tablet Take 500 mg by mouth daily. Reported on 09/07/2015    Historical Provider, MD    Family History Family History  Problem Relation Age of Onset  . Seizures Mother   . Seizures Sister     1 sister has  .  Pancreatic cancer Sister   . Colon cancer Neg Hx   . Colon polyps Neg Hx   . Esophageal cancer Neg Hx   . Rectal cancer Neg Hx   . Stomach cancer Neg Hx     Social History Social History  Substance Use Topics  . Smoking status: Never Smoker  . Smokeless tobacco: Never Used  . Alcohol use No     Allergies   Abilify [aripiprazole]; Seroquel [quetiapine fumarate]; Amoxicillin-pot clavulanate; and Keppra [levetiracetam]   Review of Systems Review of Systems  Constitutional: Positive for chills and fever.  HENT: Positive for congestion, ear pain, rhinorrhea and sore throat.   Respiratory: Positive for cough (productive (green); episodes lead to vomit.).   Gastrointestinal: Positive for nausea and vomiting (blood).     Physical Exam Updated Vital Signs BP 92/62 (BP Location: Left Arm)   Pulse 92   Temp 97.6 F (36.4 C) (Oral)   Resp 15   Ht 4' 9.5" (1.461 m)   Wt 83.9 kg   SpO2 99%   BMI 39.34 kg/m   Physical Exam  Constitutional: She is oriented to person, place, and time. She appears well-developed and well-nourished. No distress.  HENT:    Head: Normocephalic and atraumatic.  Eyes: EOM are normal.  Neck: Normal range of motion.  Cardiovascular: Normal rate and regular rhythm.   Pulmonary/Chest: Effort normal. She has wheezes. She has no rales.  Abdominal: Soft. She exhibits no distension. There is tenderness. There is no rebound and no guarding.  Musculoskeletal: Normal range of motion.  Neurological: She is alert and oriented to person, place, and time.  Skin: Skin is warm and dry.  Psychiatric: She has a normal mood and affect. Judgment normal.  Nursing note and vitals reviewed.    ED Treatments / Results  DIAGNOSTIC STUDIES: Oxygen Saturation is 98% on RA, normal by my interpretation.    COORDINATION OF CARE: 1:55 AM Discussed treatment plan with pt at bedside and pt agreed to plan.  Dr. Adriana Simas in to examine the patient and patient is tender with palpation of the abdomen. Dr. Adriana Simas request CT of abdomen and IV hydration.   Labs (all labs ordered are listed, but only abnormal results are displayed) Labs Reviewed  URINALYSIS, ROUTINE W REFLEX MICROSCOPIC (NOT AT [facility]) - Abnormal; Notable for the following:       Result Value   Color, Urine AMBER (*)    APPearance CLOUDY (*)    Specific Gravity, Urine 1.040 (*)    Bilirubin Urine SMALL (*)    Leukocytes, UA SMALL (*)    All other components within normal limits  CBC WITH DIFFERENTIAL/PLATELET - Abnormal; Notable for the following:    WBC 16.8 (*)    Neutro Abs 10.0 (*)    Lymphs Abs 5.4 (*)    Monocytes Absolute 1.4 (*)    All other components within normal limits  BASIC METABOLIC PANEL - Abnormal; Notable for the following:    Potassium 3.2 (*)    CO2 21 (*)    Glucose, Bld 158 (*)    Creatinine, Ser 1.29 (*)    GFR calc non Af Amer 59 (*)    All other components within normal limits  URINE MICROSCOPIC-ADD ON - Abnormal; Notable for the following:    Squamous Epithelial / LPF 0-5 (*)    Bacteria, UA MANY (*)    All other components within normal limits   URINE CULTURE  VALPROIC ACID LEVEL  POC URINE PREG, ED  Radiology Ct Abdomen Pelvis W Contrast  Result Date: 01/29/2016 CLINICAL DATA:  Acute onset of hemoptysis. Nausea and vomiting. Initial encounter. EXAM: CT ABDOMEN AND PELVIS WITH CONTRAST TECHNIQUE: Multidetector CT imaging of the abdomen and pelvis was performed using the standard protocol following bolus administration of intravenous contrast. CONTRAST:  100 mL of Isovue 300 IV contrast COMPARISON:  CT of the abdomen and pelvis from 02/28/2014 FINDINGS: Lower chest: The visualized lung bases are grossly clear. The visualized portions of the mediastinum are unremarkable. Hepatobiliary: The liver is unremarkable in appearance. The gallbladder is unremarkable in appearance. The common bile duct remains normal in caliber. Pancreas: The pancreas is within normal limits. Spleen: The spleen is unremarkable in appearance. Adrenals/Urinary Tract: A 4.9 cm right renal cyst is noted. The kidneys are otherwise unremarkable. There is no evidence of hydronephrosis. No renal or ureteral stones are seen. No perinephric stranding is appreciated. Stomach/Bowel: The stomach is unremarkable in appearance. The small bowel is within normal limits. The appendix is normal in caliber, without evidence of appendicitis. The colon is unremarkable in appearance. Vascular/Lymphatic: The abdominal aorta is unremarkable in appearance. The inferior vena cava is grossly unremarkable. No retroperitoneal lymphadenopathy is seen. No pelvic sidewall lymphadenopathy is identified. Reproductive: The bladder is mildly distended and within normal limits. The uterus is grossly unremarkable in appearance. The ovaries are relatively symmetric. No suspicious adnexal masses are seen. Other: No additional soft tissue abnormalities are seen. Musculoskeletal: No acute osseous abnormalities are identified. Chronic bilateral pars defects are noted at L5, without evidence of anterolisthesis. The  visualized musculature is unremarkable in appearance. IMPRESSION: 1. No acute abnormality seen within the abdomen or pelvis. 2. Chronic bilateral pars defects at L5, without evidence of anterolisthesis. 3. Right renal cyst seen. Electronically Signed   By: Roanna RaiderJeffery  Chang M.D.   On: 01/29/2016 01:02    Procedures Procedures (including critical care time) After neb treatment patient lungs are clear.  IV hydration  Medications Ordered in ED Medications  0.9 %  sodium chloride infusion ( Intravenous Stopped 01/29/16 0138)  iopamidol (ISOVUE-300) 61 % injection (not administered)  ondansetron (ZOFRAN) injection 4 mg (4 mg Intravenous Given 01/28/16 2221)  ipratropium-albuterol (DUONEB) 0.5-2.5 (3) MG/3ML nebulizer solution 3 mL (3 mLs Nebulization Given 01/28/16 2303)   Dr. Preston FleetingGlick in to examine the patient prior to d/c.  Urine sent for culture. Plan is to continue current meds and f/u with PCP.  Patient's mother is persistent that the patient needs additional steroids for her wheezing.  Dr. Preston FleetingGlick will add Prednisone 20 mg BID x 5 days.   Initial Impression / Assessment and Plan / ED Course  I have reviewed the triage vital signs and the nursing notes.  Pertinent labs & imaging results that were available during my care of the patient were reviewed by me and considered in my medical decision making (see chart for details).  Clinical Course    Final Clinical Impressions(s) / ED Diagnoses   Final diagnoses:  Flu-like symptoms    New Prescriptions New Prescriptions   PREDNISONE (DELTASONE) 10 MG TABLET    Take 2 tablets (20 mg total) by mouth daily.  I personally performed the services described in this documentation, which was scribed in my presence. The recorded information has been reviewed and is accurate.    [address]Hope GenoaM Neese, NP 01/29/16 0201    Donnetta HutchingBrian Cook, MD 02/02/16 [phone]

## 2016-01-28 NOTE — ED Notes (Signed)
Pt requesting Sprite and a snack. Pt began coughing and spat up some yellow phlegm. Emesis bag at bedside.

## 2016-01-28 NOTE — ED Triage Notes (Signed)
Mom reports continued runny nose, productive cough with green sputum, sore throat, and congestion.  [facility] but mom doesn't feel like she got enough steroids.

## 2016-01-28 NOTE — ED Notes (Signed)
Per main lab, urine sample sent by mini lab had leaked out into biohazard bag. Will collect another sample from patient.

## 2016-01-29 ENCOUNTER — Emergency Department (HOSPITAL_COMMUNITY): Payer: Medicare Other

## 2016-01-29 DIAGNOSIS — N281 Cyst of kidney, acquired: Secondary | ICD-10-CM | POA: Diagnosis not present

## 2016-01-29 DIAGNOSIS — J111 Influenza due to unidentified influenza virus with other respiratory manifestations: Secondary | ICD-10-CM | POA: Diagnosis not present

## 2016-01-29 LAB — VALPROIC ACID LEVEL: Valproic Acid Lvl: 65 ug/mL (ref 50.0–100.0)

## 2016-01-29 MED ORDER — IOPAMIDOL (ISOVUE-300) INJECTION 61%
INTRAVENOUS | Status: AC
  Filled 2016-01-29: qty 100

## 2016-01-29 MED ORDER — PREDNISONE 10 MG PO TABS: 20.0000 mg | ORAL_TABLET | Freq: Every day | ORAL | 0 refills | Status: DC

## 2016-01-29 NOTE — Discharge Instructions (Signed)
Call your doctor later today for follow up. Continue the medications you are currently taking. Be sure you are drinking plenty of fluids. We have sent your urine for culture. If you need additional medication someone will call you.

## 2016-01-29 NOTE — ED Notes (Addendum)
Pt repositioned and BP assessed on opposite (right) arm resulting 104/71. BP reassessed on left arm again resulting in 84/53. EDP notified.

## 2016-01-29 NOTE — ED Notes (Signed)
Pt returned from CT °

## 2016-01-29 NOTE — ED Notes (Signed)
EDP at bedside  

## 2016-01-29 NOTE — ED Provider Notes (Signed)
At 20 year old female with autism comes in with persistent cough and wheezing. She been treated for an upper Esther infection and is currently on an antibiotic Truman Hayward(Omnicef). Exam earlier in the evening had suggested some abdominal tenderness and she was sent for CT of abdomen and pelvis which was unremarkable. WBC is elevated, but with normal differential and probably is elevated related to recent course of steroids. Lungs are clear following albuterol nebulizer treatment in the ED. Urine showed questionable infection and is sent for culture. No indication to change antibiotics at this point. She is sent home with a longer course of prednisone.  Medical screening examination/treatment/procedure(s) were conducted as a shared visit with non-physician practitioner(s) and myself.  I personally evaluated the patient during the encounter.     Dione Boozeavid Zeola Brys, MD 02/05/16 [phone]

## 2016-01-29 NOTE — ED Notes (Addendum)
EDP notified of 88/52 BP. No new orders placed, waiting on CT and valproic acid to result.

## 2016-01-30 ENCOUNTER — Encounter (HOSPITAL_COMMUNITY): Payer: Self-pay | Admitting: Emergency Medicine

## 2016-01-30 ENCOUNTER — Ambulatory Visit (HOSPITAL_COMMUNITY)
Admission: EM | Admit: 2016-01-30 | Discharge: 2016-01-30 | Disposition: A | Discharge status: Home / Self Care | Payer: Medicare Other | Attending: Family Medicine | Admitting: Family Medicine

## 2016-01-30 DIAGNOSIS — J40 Bronchitis, not specified as acute or chronic: Secondary | ICD-10-CM | POA: Diagnosis not present

## 2016-01-30 LAB — URINE CULTURE

## 2016-01-30 MED ORDER — AZITHROMYCIN 250 MG PO TABS: 250.0000 mg | ORAL_TABLET | Freq: Every day | ORAL | 0 refills | Status: DC

## 2016-01-30 NOTE — ED Triage Notes (Addendum)
[facility] a [facility] of a follow up for a visit to the ED on 01/28/2016. The patient was evaluated for shortness of breath and a sore throat and reported that she was given prednisone and she reported that she has been taking it as described and is not feeling any better.

## 2016-01-30 NOTE — ED Provider Notes (Signed)
CSN: [id number]     Arrival date & time 01/30/16  1518 History   None    Chief Complaint  Patient presents with  . Shortness of Breath   (Consider location/radiation/quality/duration/timing/severity/associated sxs/prior Treatment) Patient is having some URI sx's and has just finished abx's.  She is coughing a lot and she was rx'd steriods and has not taken any per family.    The history is provided by the patient.  Shortness of Breath  Severity:  Moderate Onset quality:  Gradual Duration:  3 weeks Timing:  Constant Chronicity:  Recurrent Context: activity   Relieved by:  Nothing Worsened by:  Nothing Ineffective treatments:  None tried Associated symptoms: cough and wheezing     Past Medical History:  Diagnosis Date  . Allergy   . Asthma   . Autism    mom states no actual dx, but admits she has some symptoms  . Bronchitis   . Constipation   . Frequency-urgency syndrome     some incontinence ; wears pull-ups  . Interstitial cystitis   . Mental deficiency    hx of aggressive behavior,impaired speech   . Pneumonia   . Seizure (HCC)    most recent sz " at the end of summer"-last sz years ago per mother   Past Surgical History:  Procedure Laterality Date  . CYSTOSCOPY    . CYSTOSCOPY WITH HYDRODISTENSION AND BIOPSY  04/14/2012   Procedure: CYSTOSCOPY/BIOPSY/HYDRODISTENSION;  Surgeon: Lindaann Slough, MD;  Location: [facility];  Service: Urology;;  instillation of marcaine and pyridium   Family History  Problem Relation Age of Onset  . Seizures Mother   . Seizures Sister     1 sister has  . Pancreatic cancer Sister   . Colon cancer Neg Hx   . Colon polyps Neg Hx   . Esophageal cancer Neg Hx   . Rectal cancer Neg Hx   . Stomach cancer Neg Hx    Social History  Substance Use Topics  . Smoking status: Never Smoker  . Smokeless tobacco: Never Used  . Alcohol use No   OB History    No data available     Review of Systems  Constitutional:  Positive for fatigue.  HENT: Negative.   Eyes: Negative.   Respiratory: Positive for cough, shortness of breath and wheezing.   Gastrointestinal: Negative.   Endocrine: Negative.   Genitourinary: Negative.   Musculoskeletal: Negative.   Skin: Negative.   Allergic/Immunologic: Negative.   Neurological: Negative.   Hematological: Negative.   Psychiatric/Behavioral: Negative.     Allergies  Abilify [aripiprazole]; Seroquel [quetiapine fumarate]; Amoxicillin-pot clavulanate; and Keppra [levetiracetam]  Home Medications   Prior to Admission medications   Medication Sig Start Date End Date Taking? Authorizing Provider  cefdinir (OMNICEF) 300 MG capsule Take 1 capsule (300 mg total) by mouth 2 (two) times daily. 01/24/16  Yes Linna Hoff, MD  predniSONE (DELTASONE) 10 MG tablet Take 2 tablets (20 mg total) by mouth daily. 01/29/16  Yes Hope Orlene Och, NP  albuterol (PROVENTIL) (2.5 MG/3ML) 0.083% nebulizer solution Nebulize one ampule every four hours as needed for cough or wheeze. 11/15/15   Jessica Priest, MD  azithromycin (ZITHROMAX) 250 MG tablet Take 1 tablet (250 mg total) by mouth daily. Take first 2 tablets together, then 1 every day until finished. 01/30/16   Deatra Canter, FNP  benzonatate (TESSALON) 200 MG capsule Take 1 capsule (200 mg total) by mouth 3 (three) times daily as needed for cough.  Patient not taking: Reported on 01/25/2016 12/18/15   Eustace MooreLaura W Murray, MD  budesonide (PULMICORT) 0.5 MG/2ML nebulizer solution Take 2 mLs (0.5 mg total) by nebulization 2 (two) times daily. One vial twice daily 03/28/15   Baxter Hireoselyn M Hicks, MD  cetirizine (ZYRTEC) 10 MG tablet Take one tablet daily for runny nose or itching. 09/01/15   Roselyn Kara MeadM Hicks, MD  cloNIDine (CATAPRES) 0.1 MG tablet Take 1+1/2  tab by mouth at bedtime. 10/04/15   Deetta PerlaWilliam H Hickling, MD  Cranberry 500 MG CAPS Take 500 mg by mouth daily. Reported on 09/07/2015    Historical Provider, MD  DEPAKOTE 500 MG DR tablet Take 1  tablet twice per day 10/04/15   Deetta PerlaWilliam H Hickling, MD  fluconazole (DIFLUCAN) 100 MG tablet TAKE 1 TABLET ONCE REPEAT IN 48 TO 72 HOURS 12/13/15   Rachelle A Denney, CNM  hydrocortisone valerate ointment (WESTCORT) 0.2 % Apply 1 application topically 2 (two) times daily. 12/18/15   Eustace MooreLaura W Murray, MD  hyoscyamine (LEVSIN SL) 0.125 MG SL tablet DISSOLVE 1 TABLET UNDER THE TONGUE 2 TIMES A DAY 08/08/14   Historical Provider, MD  ipratropium (ATROVENT) 0.06 % nasal spray Place 2 sprays into both nostrils 4 (four) times daily. 09/18/15   Linna HoffJames D Kindl, MD  Levonorgestrel-Ethinyl Estradiol (SEASONIQUE) 0.15-0.03 &0.01 MG tablet Take 1 tablet by mouth daily. Take 1 active tablet daily, skip non active pills. 10/27/13   Antionette CharLisa Jackson-Moore, MD  Melatonin 3 MG TABS Take 3 mg by mouth at bedtime. For insomnia    Historical Provider, MD  montelukast (SINGULAIR) 10 MG tablet Take one tablet each evening to prevent cough or wheeze. 09/07/15   Roselyn Kara MeadM Hicks, MD  Multiple Vitamin (MULTIVITAMIN WITH MINERALS) TABS tablet Take 1 tablet by mouth daily.    Historical Provider, MD  nystatin-triamcinolone (MYCOLOG II) cream Apply 1 application topically daily as needed. Reported on 08/08/2015 02/10/15   Historical Provider, MD  Olopatadine HCl 0.6 % SOLN Use one spray twice daily for stuffy nose or drainage. 03/08/15   Roselyn Kara MeadM Hicks, MD  ondansetron (ZOFRAN ODT) 8 MG disintegrating tablet Take 1 tablet (8 mg total) by mouth every 8 (eight) hours as needed for nausea or vomiting. 02/15/15   Joseph ArtJessica U Vann, DO  ondansetron (ZOFRAN) 4 MG tablet Take 1 tablet (4 mg total) by mouth every 6 (six) hours. 12/31/15   Tharon AquasFrank C Patrick, PA  polyethylene glycol [facility](MIRALAX / GLYCOLAX) packet Take 17 g by mouth daily.    Historical Provider, MD  ranitidine (ZANTAC) 150 MG tablet Twice daily, morning and evening. 08/15/15   Beverley FiedlerJay M Pyrtle, MD  senna (SENOKOT) 8.6 MG tablet Take 1 tablet by mouth daily.    Historical Provider, MD  torsemide  (DEMADEX) 10 MG tablet Take 15 mg by mouth daily.     Historical Provider, MD  traZODone (DESYREL) 50 MG tablet Take 2 tabs by mouth at bedtime 10/04/15   Deetta PerlaWilliam H Hickling, MD  vitamin C (ASCORBIC ACID) 500 MG tablet Take 500 mg by mouth daily. Reported on 09/07/2015    Historical Provider, MD   Meds Ordered and Administered this Visit  Medications - No data to display  BP 130/93 (BP Location: Left Arm)   Pulse 102   Temp 99.1 F (37.3 C) (Oral)   Resp 18   SpO2 98%  No data found.   Physical Exam  Constitutional: She appears well-developed and well-nourished.  HENT:  Head: Normocephalic and atraumatic.  Right Ear: External ear  normal.  Left Ear: External ear normal.  Mouth/Throat: Oropharynx is clear and moist.  Eyes: Conjunctivae and EOM are normal. Pupils are equal, round, and reactive to light.  Neck: Normal range of motion. Neck supple.  Cardiovascular: Normal rate, regular rhythm and normal heart sounds.   Pulmonary/Chest: Effort normal and breath sounds normal.  Abdominal: Soft. Bowel sounds are normal.  Nursing note and vitals reviewed.   Urgent Care Course   Clinical Course     Procedures (including critical care time)  Labs Review Labs Reviewed - No data to display  Imaging Review Ct Abdomen Pelvis W Contrast  Result Date: 01/29/2016 CLINICAL DATA:  Acute onset of hemoptysis. Nausea and vomiting. Initial encounter. EXAM: CT ABDOMEN AND PELVIS WITH CONTRAST TECHNIQUE: Multidetector CT imaging of the abdomen and pelvis was performed using the standard protocol following bolus administration of intravenous contrast. CONTRAST:  100 mL of Isovue 300 IV contrast COMPARISON:  CT of the abdomen and pelvis from 02/28/2014 FINDINGS: Lower chest: The visualized lung bases are grossly clear. The visualized portions of the mediastinum are unremarkable. Hepatobiliary: The liver is unremarkable in appearance. The gallbladder is unremarkable in appearance. The common bile duct  remains normal in caliber. Pancreas: The pancreas is within normal limits. Spleen: The spleen is unremarkable in appearance. Adrenals/Urinary Tract: A 4.9 cm right renal cyst is noted. The kidneys are otherwise unremarkable. There is no evidence of hydronephrosis. No renal or ureteral stones are seen. No perinephric stranding is appreciated. Stomach/Bowel: The stomach is unremarkable in appearance. The small bowel is within normal limits. The appendix is normal in caliber, without evidence of appendicitis. The colon is unremarkable in appearance. Vascular/Lymphatic: The abdominal aorta is unremarkable in appearance. The inferior vena cava is grossly unremarkable. No retroperitoneal lymphadenopathy is seen. No pelvic sidewall lymphadenopathy is identified. Reproductive: The bladder is mildly distended and within normal limits. The uterus is grossly unremarkable in appearance. The ovaries are relatively symmetric. No suspicious adnexal masses are seen. Other: No additional soft tissue abnormalities are seen. Musculoskeletal: No acute osseous abnormalities are identified. Chronic bilateral pars defects are noted at L5, without evidence of anterolisthesis. The visualized musculature is unremarkable in appearance. IMPRESSION: 1. No acute abnormality seen within the abdomen or pelvis. 2. Chronic bilateral pars defects at L5, without evidence of anterolisthesis. 3. Right renal cyst seen. Electronically Signed   By: Roanna RaiderJeffery  Chang M.D.   On: 01/29/2016 01:02     Visual Acuity Review  Right Eye Distance:   Left Eye Distance:   Bilateral Distance:    Right Eye Near:   Left Eye Near:    Bilateral Near:         MDM   1. Bronchitis    Zithromax 250mg  tak3 2po first day and then one po qd x 4 days #6. Start Steroids 20mg  po qd x 4 days as rx'd by ED. Reassured patient family she is doing better.      Deatra CanterWilliam J Oxford, FNP 01/30/16 1745

## 2016-02-07 ENCOUNTER — Telehealth: Payer: Self-pay | Admitting: Internal Medicine

## 2016-02-07 ENCOUNTER — Ambulatory Visit: Payer: Medicare Other | Admitting: Allergy

## 2016-02-07 ENCOUNTER — Emergency Department (HOSPITAL_COMMUNITY)
Admission: EM | Admit: 2016-02-07 | Discharge: 2016-02-07 | Disposition: A | Discharge status: Home / Self Care | Payer: Medicare Other | Attending: Emergency Medicine | Admitting: Emergency Medicine

## 2016-02-07 ENCOUNTER — Emergency Department (HOSPITAL_COMMUNITY): Payer: Medicare Other

## 2016-02-07 ENCOUNTER — Encounter (HOSPITAL_COMMUNITY): Payer: Self-pay

## 2016-02-07 DIAGNOSIS — F840 Autistic disorder: Secondary | ICD-10-CM | POA: Insufficient documentation

## 2016-02-07 DIAGNOSIS — J45909 Unspecified asthma, uncomplicated: Secondary | ICD-10-CM | POA: Diagnosis not present

## 2016-02-07 DIAGNOSIS — J069 Acute upper respiratory infection, unspecified: Secondary | ICD-10-CM | POA: Insufficient documentation

## 2016-02-07 DIAGNOSIS — R05 Cough: Secondary | ICD-10-CM | POA: Diagnosis not present

## 2016-02-07 DIAGNOSIS — J029 Acute pharyngitis, unspecified: Secondary | ICD-10-CM | POA: Diagnosis present

## 2016-02-07 LAB — CBC WITH DIFFERENTIAL/PLATELET
BASOS PCT: 1 %
Basophils Absolute: 0.2 10*3/uL — ABNORMAL HIGH (ref 0.0–0.1)
EOS ABS: 0 10*3/uL (ref 0.0–0.7)
Eosinophils Relative: 0 %
HCT: 36.9 % (ref 36.0–46.0)
HEMOGLOBIN: 12.1 g/dL (ref 12.0–15.0)
LYMPHS PCT: 41 %
Lymphs Abs: 8.5 10*3/uL — ABNORMAL HIGH (ref 0.7–4.0)
MCH: 29.7 pg (ref 26.0–34.0)
MCHC: 32.8 g/dL (ref 30.0–36.0)
MCV: 90.7 fL (ref 78.0–100.0)
Monocytes Absolute: 1.9 10*3/uL — ABNORMAL HIGH (ref 0.1–1.0)
Monocytes Relative: 9 %
NEUTROS ABS: 10.2 10*3/uL — AB (ref 1.7–7.7)
Neutrophils Relative %: 49 %
PLATELETS: 303 10*3/uL (ref 150–400)
RBC: 4.07 MIL/uL (ref 3.87–5.11)
RDW: 14.1 % (ref 11.5–15.5)
WBC: 20.8 10*3/uL — ABNORMAL HIGH (ref 4.0–10.5)

## 2016-02-07 LAB — COMPREHENSIVE METABOLIC PANEL
ALK PHOS: 47 U/L (ref 38–126)
ALT: 18 U/L (ref 14–54)
AST: 21 U/L (ref 15–41)
Albumin: 3 g/dL — ABNORMAL LOW (ref 3.5–5.0)
Anion gap: 11 (ref 5–15)
BUN: 7 mg/dL (ref 6–20)
CALCIUM: 8.7 mg/dL — AB (ref 8.9–10.3)
CO2: 26 mmol/L (ref 22–32)
CREATININE: 0.88 mg/dL (ref 0.44–1.00)
Chloride: 103 mmol/L (ref 101–111)
Glucose, Bld: 77 mg/dL (ref 65–99)
Potassium: 3.8 mmol/L (ref 3.5–5.1)
Sodium: 140 mmol/L (ref 135–145)
Total Bilirubin: 0.1 mg/dL — ABNORMAL LOW (ref 0.3–1.2)
Total Protein: 5.7 g/dL — ABNORMAL LOW (ref 6.5–8.1)

## 2016-02-07 LAB — RAPID STREP SCREEN (MED CTR MEBANE ONLY): Streptococcus, Group A Screen (Direct): NEGATIVE

## 2016-02-07 LAB — MONONUCLEOSIS SCREEN: MONO SCREEN: NEGATIVE

## 2016-02-07 LAB — D-DIMER, QUANTITATIVE: D-Dimer, Quant: <0.27 ug/mL-FEU (ref 0.00–0.50)

## 2016-02-07 LAB — I-STAT BETA HCG BLOOD, ED (MC, WL, AP ONLY)

## 2016-02-07 MED ORDER — ALBUTEROL SULFATE HFA 108 (90 BASE) MCG/ACT IN AERS: 1.0000 | INHALATION_SPRAY | Freq: Four times a day (QID) | RESPIRATORY_TRACT | 0 refills | Status: DC | PRN

## 2016-02-07 MED ORDER — BENZONATATE 200 MG PO CAPS: 200.0000 mg | ORAL_CAPSULE | Freq: Three times a day (TID) | ORAL | 0 refills | Status: DC | PRN

## 2016-02-07 MED FILL — BENZONATATE 100 MG CAPSULE: 100 | 10 days supply | Qty: 60 | Fill #0

## 2016-02-07 NOTE — Telephone Encounter (Signed)
[name] was advised by hospital case manager to [facility] due to high cost and medicaid not covering all of them.  Pt not ready to schedule HFU appt at this time but is eligible to [facility].

## 2016-02-07 NOTE — ED Notes (Signed)
Pt mother agitated with EDP, wants a "further workup and a new doctor" EDP and charge RN aware. Was able to talk pt mother down and explain that EDP has added on another test. Primary RN also aware of situation

## 2016-02-07 NOTE — Discharge Instructions (Addendum)
Xray is normal. There is no evidence of pneumonia or blood clot in the lung. There is no indication for further antibiotics or steroids. Followup with your doctor. Return to the ED if you develop new or worsening symptoms.

## 2016-02-07 NOTE — ED Notes (Signed)
[name] upset with discharge and the care of her daughter. Attempted to discuss discharge instructions and reviewed medications. [name] wanted to speak to charge RN. Charge RN speaking with pt.

## 2016-02-07 NOTE — ED Notes (Signed)
pts mother asking about the wait pt sleeping

## 2016-02-07 NOTE — Discharge Planning (Signed)
[facility].  Pt has Medicare and does not qualify for [facility].  EDCM talked with pt mom at bedside regarding this matter; mom verbalized understanding.  No further CM needs identified at this time.

## 2016-02-07 NOTE — ED Provider Notes (Signed)
[facility]-EMERGENCY DEPT Provider Note   CSN: [id number] Arrival date & time: 02/07/16  0111 By signing my name below, I, Linus GalasMaharshi Patel, attest that this documentation has been prepared under the direction and in the presence of Glynn OctaveStephen Chantee Cerino, MD. Electronically Signed: Linus GalasMaharshi Patel, ED Scribe. 02/07/16. 3:47 AM.  History   Chief Complaint Chief Complaint  Patient presents with  . Sore Throat  . Cough   The history is provided by the patient and a parent. No language interpreter was used.   HPI Comments: Lori Miles is a 20 y.o. female who presents to the Emergency Department with a PMHx of autism complaining of an ongoing sore throat for the past "7 weeks." Pt also reports green productive cough, emesis, and fever. She was seen at Urgent Care for similar symptoms last week. Pt was dx with a URI and finished a course of Omnicef and steroids.  Mother denies any CP, SOB, abdominal pain, or any other symptoms at this time. Pt is on birth control. Pt denies any sick contacts.   Past Medical History:  Diagnosis Date  . Allergy   . Asthma   . Autism    mom states no actual dx, but admits she has some symptoms  . Bronchitis   . Constipation   . Frequency-urgency syndrome     some incontinence ; wears pull-ups  . Interstitial cystitis   . Mental deficiency    hx of aggressive behavior,impaired speech   . Pneumonia   . Seizure (HCC)    most recent sz " at the end of summer"-last sz years ago per mother   Patient Active Problem List   Diagnosis Date Noted  . Insomnia 10/04/2015  . Acute bronchitis 05/05/2015  . Dyspnea 05/05/2015  . Gait disorder 03/22/2015  . 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 Cough variant asthma vs UACS/ vcd  03/15/2015  . CAP (community acquired pneumonia) 02/14/2015  . Autism spectrum disorder 02/14/2015  . Acute respiratory failure (HCC) 02/14/2015  . Nausea & vomiting 02/14/2015  . Disturbance in sleep behavior 12/13/2014  . Obesity, morbid  (HCC) 12/13/2014  . 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 Acanthosis nigricans 11/14/2014  . Toeing-in 08/29/2014  . Obesity (BMI 30-39.9) 08/23/2013  . 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 Interstitial cystitis 04/28/2012  . Recurrent urinary tract infection 08/01/2011  . Asthma 08/01/2011  . Chronic constipation 08/01/2011  . Contraception 08/01/2011   Past Surgical History:  Procedure Laterality Date  . CYSTOSCOPY    . CYSTOSCOPY WITH HYDRODISTENSION AND BIOPSY  04/14/2012   Procedure: CYSTOSCOPY/BIOPSY/HYDRODISTENSION;  Surgeon: Lindaann SloughMarc-Henry Nesi, MD;  Location: [facility];  Service: Urology;;  instillation of marcaine and pyridium   OB History    No data available     Home Medications    Prior to Admission medications   Medication Sig Start Date End Date Taking? Authorizing Provider  albuterol (PROVENTIL) (2.5 MG/3ML) 0.083% nebulizer solution Nebulize one ampule every four hours as needed for cough or wheeze. 11/15/15   Jessica PriestEric J Kozlow, MD  azithromycin (ZITHROMAX) 250 MG tablet Take 1 tablet (250 mg total) by mouth daily. Take first 2 tablets together, then 1 every day until finished. 01/30/16   Deatra CanterWilliam J Oxford, FNP  benzonatate (TESSALON) 200 MG capsule Take 1  capsule (200 mg total) by mouth 3 (three) times daily as needed for cough. Patient not taking: Reported on 01/25/2016 12/18/15   Eustace Moore, MD  budesonide (PULMICORT) 0.5 MG/2ML nebulizer solution Take 2 mLs (0.5 mg total) by nebulization 2 (two) times daily. One vial twice daily 03/28/15   Baxter Hire, MD  cefdinir (OMNICEF) 300 MG capsule Take 1 capsule (300 mg total) by mouth 2 (two) times daily. 01/24/16   Linna Hoff, MD    cetirizine (ZYRTEC) 10 MG tablet Take one tablet daily for runny nose or itching. 09/01/15   Roselyn Kara Mead, MD  cloNIDine (CATAPRES) 0.1 MG tablet Take 1+1/2  tab by mouth at bedtime. 10/04/15   Deetta Perla, MD  Cranberry 500 MG CAPS Take 500 mg by mouth daily. Reported on 09/07/2015    Historical Provider, MD  DEPAKOTE 500 MG DR tablet Take 1 tablet twice per day 10/04/15   Deetta Perla, MD  fluconazole (DIFLUCAN) 100 MG tablet TAKE 1 TABLET ONCE REPEAT IN 48 TO 72 HOURS 12/13/15   Rachelle A Denney, CNM  hydrocortisone valerate ointment (WESTCORT) 0.2 % Apply 1 application topically 2 (two) times daily. 12/18/15   Eustace Moore, MD  hyoscyamine (LEVSIN SL) 0.125 MG SL tablet DISSOLVE 1 TABLET UNDER THE TONGUE 2 TIMES A DAY 08/08/14   Historical Provider, MD  ipratropium (ATROVENT) 0.06 % nasal spray Place 2 sprays into both nostrils 4 (four) times daily. 09/18/15   Linna Hoff, MD  Levonorgestrel-Ethinyl Estradiol (SEASONIQUE) 0.15-0.03 &0.01 MG tablet Take 1 tablet by mouth daily. Take 1 active tablet daily, skip non active pills. 10/27/13   Antionette Char, MD  Melatonin 3 MG TABS Take 3 mg by mouth at bedtime. For insomnia    Historical Provider, MD  montelukast (SINGULAIR) 10 MG tablet Take one tablet each evening to prevent cough or wheeze. 09/07/15   Roselyn Kara Mead, MD  Multiple Vitamin (MULTIVITAMIN WITH MINERALS) TABS tablet Take 1 tablet by mouth daily.    Historical Provider, MD  nystatin-triamcinolone (MYCOLOG II) cream Apply 1 application topically daily as needed. Reported on 08/08/2015 02/10/15   Historical Provider, MD  Olopatadine HCl 0.6 % SOLN Use one spray twice daily for stuffy nose or drainage. 03/08/15   Roselyn Kara Mead, MD  ondansetron (ZOFRAN ODT) 8 MG disintegrating tablet Take 1 tablet (8 mg total) by mouth every 8 (eight) hours as needed for nausea or vomiting. 02/15/15   Joseph Art, DO  ondansetron (ZOFRAN) 4 MG tablet Take 1 tablet (4 mg total) by mouth  every 6 (six) hours. 12/31/15   Tharon Aquas, PA  polyethylene glycol [facility] / GLYCOLAX) packet Take 17 g by mouth daily.    Historical Provider, MD  predniSONE (DELTASONE) 10 MG tablet Take 2 tablets (20 mg total) by mouth daily. 01/29/16   Hope Orlene Och, NP  ranitidine (ZANTAC) 150 MG tablet Twice daily, morning and evening. 08/15/15   Beverley Fiedler, MD  senna (SENOKOT) 8.6 MG tablet Take 1 tablet by mouth daily.    Historical Provider, MD  torsemide (DEMADEX) 10 MG tablet Take 15 mg by mouth daily.     Historical Provider, MD  traZODone (DESYREL) 50 MG tablet Take 2 tabs by mouth at bedtime 10/04/15   Deetta Perla, MD  vitamin C (ASCORBIC ACID) 500 MG tablet Take 500 mg by mouth daily. Reported on 09/07/2015    Historical Provider, MD   Family History Family History  Problem Relation Age of Onset  . Seizures Mother   . Seizures Sister     1 sister has  . Pancreatic cancer Sister   . Colon cancer Neg Hx   . Colon polyps Neg Hx   . Esophageal cancer Neg Hx   . Rectal cancer Neg Hx   . Stomach cancer Neg Hx    Social History Social History  Substance Use Topics  . Smoking status: Never Smoker  . Smokeless tobacco: Never Used  . Alcohol use No   Allergies   Abilify [aripiprazole]; Seroquel [quetiapine fumarate]; Amoxicillin-pot clavulanate; and Keppra [levetiracetam]  Review of Systems Review of Systems A complete 10 system review of systems was obtained and all systems are negative except as noted in the HPI and PMH.   Physical Exam Updated Vital Signs BP 108/69   Pulse 103   Temp 98.4 F (36.9 C) (Oral)   Resp 16   SpO2 100%   Physical Exam  Constitutional: She is oriented to person, place, and time. She appears well-developed and well-nourished. No distress.  HENT:  Head: Normocephalic and atraumatic.  Right Ear: Tympanic membrane is not erythematous. A middle ear effusion is present.  Left Ear: Tympanic membrane is not erythematous. A middle ear effusion is  present.  Mouth/Throat: Oropharynx is clear and moist. No oropharyngeal exudate.  Eyes: Conjunctivae and EOM are normal. Pupils are equal, round, and reactive to light.  Neck: Normal range of motion. Neck supple.  No meningismus.  Cardiovascular: Normal rate, regular rhythm, normal heart sounds and intact distal pulses.   No murmur heard. Pulmonary/Chest: Effort normal and breath sounds normal. No respiratory distress. She has no wheezes.  Good air exchange, no wheezing  Abdominal: Soft. There is no tenderness. There is no rebound and no guarding.  Obese  Musculoskeletal: Normal range of motion. She exhibits no edema or tenderness.  Neurological: She is alert and oriented to person, place, and time. No cranial nerve deficit. She exhibits normal muscle tone. Coordination normal.   5/5 strength throughout. CN 2-12 intact.Equal grip strength.   Skin: Skin is warm.  Psychiatric: She has a normal mood and affect. Her behavior is normal.  Nursing note and vitals reviewed.  ED Treatments / Results  DIAGNOSTIC STUDIES: Oxygen Saturation is 100% on room air, normal by my interpretation.    COORDINATION OF CARE: 3:48 AM Discussed treatment plan with pt at bedside and pt agreed to plan.  Labs (all labs ordered are listed, but only abnormal results are displayed) Labs Reviewed  CBC WITH DIFFERENTIAL/PLATELET - Abnormal; Notable for the following:       Result Value   WBC 20.8 (*)    Neutro Abs 10.2 (*)    Lymphs Abs 8.5 (*)    Monocytes Absolute 1.9 (*)    Basophils Absolute 0.2 (*)    All other components within normal limits  COMPREHENSIVE METABOLIC PANEL - Abnormal; Notable for the following:    Calcium 8.7 (*)    Total Protein 5.7 (*)    Albumin 3.0 (*)    Total Bilirubin 0.1 (*)    All other components within normal limits  RAPID STREP SCREEN (NOT AT [facility])  CULTURE, GROUP A STREP ([facility])  MONONUCLEOSIS SCREEN  D-DIMER, QUANTITATIVE (NOT AT [facility])  I-STAT BETA HCG BLOOD, ED ([facility],  [facility], [facility] ONLY)    EKG  EKG Interpretation None      Radiology Dg Chest 2 View  Result Date: 02/07/2016 CLINICAL DATA:  Cough, vomiting, and fever  for over a week. EXAM: CHEST  2 VIEW COMPARISON:  01/25/2016 FINDINGS: Shallow inspiration. Normal heart size and pulmonary vascularity. No focal airspace disease or consolidation in the lungs. No blunting of costophrenic angles. No pneumothorax. Mediastinal contours appear intact. IMPRESSION: No active cardiopulmonary disease. Electronically Signed   By: Burman Nieves M.D.   On: 02/07/2016 04:52    Procedures Procedures (including critical care time)  Medications Ordered in ED Medications - No data to display  Initial Impression / Assessment and Plan / ED Course  I have reviewed the triage vital signs and the nursing notes.  Pertinent labs & imaging results that were available during my care of the patient were reviewed by me and considered in my medical decision making (see chart for details).  Clinical Course   Patient with history of autism with recurrent visits for cough and wheezing. Recently completed course of antibiotics and steroids. Mother reports she has been sick for "7 weeks". She's had multiple visits to the ED and urgent care.  Patient is well-appearing and afebrile. X-ray shows no pneumonia. She is not wheezing. Rapid strep is negative.  Labs show leukocytosis is likely from recent steroid use. D-dimer is negative.  No indication for further antibiotics or steroids at this point. Mother insistent that patient needs additional antibiotics and steroids and is not satisfied with workup performed. Mother states patient has an appointment with Dr. Concepcion Elk on 11/27. MOther reports they are no longer welcome at the [place] [facility] clinic.  Instructed mother to use albuterol neb/inhaler every 4 hours for the next 2 days and then as needed. Tried to reassure mother that further antibiotics and steroids can be harmful when not  needed. Mother also wants flu testing which would not be helpful at this point or change management given patient's length of illness.  Final Clinical Impressions(s) / ED Diagnoses   Final diagnoses:  Upper respiratory tract infection, unspecified type   New Prescriptions New Prescriptions   No medications on file   I personally performed the services described in this documentation, which was scribed in my presence. The recorded information has been reviewed and is accurate.     Glynn Octave, MD 02/07/16 [phone]

## 2016-02-07 NOTE — ED Triage Notes (Signed)
Pt family states pt has dx of URI in more than a week ago; family states pt still has sore throat; pt has had antibiotics; family states pt continues cough up mucus; Pt has hx of chronic health issues. Pt has labored breathing at triage;

## 2016-02-07 NOTE — ED Notes (Signed)
Pt given water and sprite. Tolerating liquids

## 2016-02-09 LAB — CULTURE, GROUP A STREP (THRC)

## 2016-02-12 DIAGNOSIS — J302 Other seasonal allergic rhinitis: Secondary | ICD-10-CM | POA: Diagnosis not present

## 2016-02-12 DIAGNOSIS — R569 Unspecified convulsions: Secondary | ICD-10-CM | POA: Diagnosis not present

## 2016-02-12 DIAGNOSIS — J069 Acute upper respiratory infection, unspecified: Secondary | ICD-10-CM | POA: Diagnosis not present

## 2016-02-12 DIAGNOSIS — J4520 Mild intermittent asthma, uncomplicated: Secondary | ICD-10-CM | POA: Diagnosis not present

## 2016-02-13 ENCOUNTER — Encounter: Payer: Self-pay | Admitting: Certified Nurse Midwife

## 2016-02-13 ENCOUNTER — Ambulatory Visit (INDEPENDENT_AMBULATORY_CARE_PROVIDER_SITE_OTHER): Payer: Medicare Other | Admitting: Certified Nurse Midwife

## 2016-02-13 VITALS — BP 105/70 | Temp 98.6°F | Wt 189.3 lb

## 2016-02-13 DIAGNOSIS — B373 Candidiasis of vulva and vagina: Secondary | ICD-10-CM

## 2016-02-13 DIAGNOSIS — F70 Mild intellectual disabilities: Secondary | ICD-10-CM

## 2016-02-13 DIAGNOSIS — Z3041 Encounter for surveillance of contraceptive pills: Secondary | ICD-10-CM | POA: Diagnosis not present

## 2016-02-13 DIAGNOSIS — F919 Conduct disorder, unspecified: Secondary | ICD-10-CM

## 2016-02-13 DIAGNOSIS — R269 Unspecified abnormalities of gait and mobility: Secondary | ICD-10-CM

## 2016-02-13 DIAGNOSIS — F840 Autistic disorder: Secondary | ICD-10-CM

## 2016-02-13 DIAGNOSIS — R112 Nausea with vomiting, unspecified: Secondary | ICD-10-CM

## 2016-02-13 DIAGNOSIS — F819 Developmental disorder of scholastic skills, unspecified: Secondary | ICD-10-CM | POA: Diagnosis not present

## 2016-02-13 DIAGNOSIS — Z01419 Encounter for gynecological examination (general) (routine) without abnormal findings: Secondary | ICD-10-CM | POA: Diagnosis not present

## 2016-02-13 DIAGNOSIS — G40309 Generalized idiopathic epilepsy and epileptic syndromes, not intractable, without status epilepticus: Secondary | ICD-10-CM

## 2016-02-13 DIAGNOSIS — F71 Moderate intellectual disabilities: Secondary | ICD-10-CM

## 2016-02-13 DIAGNOSIS — K5909 Other constipation: Secondary | ICD-10-CM | POA: Diagnosis not present

## 2016-02-13 DIAGNOSIS — J4530 Mild persistent asthma, uncomplicated: Secondary | ICD-10-CM | POA: Diagnosis not present

## 2016-02-13 DIAGNOSIS — B3731 Acute candidiasis of vulva and vagina: Secondary | ICD-10-CM

## 2016-02-13 DIAGNOSIS — G40209 Localization-related (focal) (partial) symptomatic epilepsy and epileptic syndromes with complex partial seizures, not intractable, without status epilepticus: Secondary | ICD-10-CM

## 2016-02-13 DIAGNOSIS — Z113 Encounter for screening for infections with a predominantly sexual mode of transmission: Secondary | ICD-10-CM | POA: Diagnosis not present

## 2016-02-13 DIAGNOSIS — R0609 Other forms of dyspnea: Secondary | ICD-10-CM

## 2016-02-13 MED ORDER — FLUCONAZOLE 200 MG PO TABS: 200.0000 mg | ORAL_TABLET | Freq: Once | ORAL | 4 refills | Status: AC

## 2016-02-13 MED ORDER — LEVONORGEST-ETH ESTRAD 91-DAY 0.15-0.03 &0.01 MG PO TABS: 1.0000 | ORAL_TABLET | Freq: Every day | ORAL | 11 refills | Status: DC

## 2016-02-13 NOTE — Progress Notes (Signed)
Subjective:        Lori SchickJasmine Miles is a 20 y.o. female here for a routine exam.  Current complaints: occasional itching.  On Seasonique for period control. Here for exam with sister and mother.      Personal health questionnaire:  Is patient Ashkenazi Jewish, have a family history of breast and/or ovarian cancer: no Is there a family history of uterine cancer diagnosed at age < 5350, gastrointestinal cancer, urinary tract cancer, family member who is a Personnel officerLynch syndrome-associated carrier: no Is the patient overweight and hypertensive, family history of diabetes, personal history of gestational diabetes, preeclampsia or PCOS: yes Is patient over 4655, have PCOS,  family history of premature CHD under age 21, diabetes, smoke, have hypertension or peripheral artery disease:  yes At any time, has a partner hit, kicked or otherwise hurt or frightened you?: no Over the past 2 weeks, have you felt down, depressed or hopeless?: no Over the past 2 weeks, have you felt little interest or pleasure in doing things?:no   Gynecologic History No LMP recorded. Patient is not currently having periods (Reason: Oral contraceptives). Contraception: OCP (estrogen/progesterone) Last Pap: n/a, 20 years of age; no prior pap smear.  Last mammogram: n/a <21 years of age.   Obstetric History OB History  No data available    Past Medical History:  Diagnosis Date  . Allergy   . Asthma   . Autism    mom states no actual dx, but admits she has some symptoms  . Bronchitis   . Constipation   . Frequency-urgency syndrome     some incontinence ; wears pull-ups  . Interstitial cystitis   . Mental deficiency    hx of aggressive behavior,impaired speech   . Pneumonia   . Seizure (HCC)    most recent sz " at the end of summer"-last sz years ago per mother    Past Surgical History:  Procedure Laterality Date  . CYSTOSCOPY    . CYSTOSCOPY WITH HYDRODISTENSION AND BIOPSY  04/14/2012   Procedure:  CYSTOSCOPY/BIOPSY/HYDRODISTENSION;  Surgeon: Lindaann SloughMarc-Henry Nesi, MD;  [facility]  [facility];  Service: Urology;;  instillation of marcaine and pyridium     Current Outpatient Prescriptions:  .  albuterol (PROVENTIL HFA;VENTOLIN HFA) 108 (90 Base) MCG/ACT inhaler, Inhale 1-2 puffs into the lungs every 6 (six) hours as needed for wheezing or shortness of breath., Disp: 1 Inhaler, Rfl: 0 .  budesonide (PULMICORT) 0.5 MG/2ML nebulizer solution, Take 2 mLs (0.5 mg total) by nebulization 2 (two) times daily. One vial twice daily, Disp: 180 mL, Rfl: 4 .  cetirizine (ZYRTEC) 10 MG tablet, Take one tablet daily for runny nose or itching., Disp: 30 tablet, Rfl: 5 .  cloNIDine (CATAPRES) 0.1 MG tablet, Take 1+1/2  tab by mouth at bedtime., Disp: 45 tablet, Rfl: 5 .  Cranberry 500 MG CAPS, Take 500 mg by mouth daily. Reported on 09/07/2015, Disp: , Rfl:  .  DEPAKOTE 500 MG DR tablet, Take 1 tablet twice per day, Disp: 60 tablet, Rfl: 5 .  hyoscyamine (LEVSIN SL) 0.125 MG SL tablet, DISSOLVE 1 TABLET UNDER THE TONGUE 2 TIMES A DAY, Disp: , Rfl: 0 .  ipratropium (ATROVENT) 0.06 % nasal spray, Place 2 sprays into both nostrils 4 (four) times daily., Disp: 15 mL, Rfl: 1 .  Levonorgestrel-Ethinyl Estradiol (SEASONIQUE) 0.15-0.03 &0.01 MG tablet, Take 1 tablet by mouth daily. Take 1 active tablet daily, skip non active pills., Disp: 1 Package, Rfl: 11 .  Melatonin 3 MG  TABS, Take 3 mg by mouth at bedtime. For insomnia, Disp: , Rfl:  .  montelukast (SINGULAIR) 10 MG tablet, Take one tablet each evening to prevent cough or wheeze., Disp: 30 tablet, Rfl: 5 .  ondansetron (ZOFRAN ODT) 8 MG disintegrating tablet, Take 1 tablet (8 mg total) by mouth every 8 (eight) hours as needed for nausea or vomiting., Disp: 10 tablet, Rfl: 0 .  polyethylene glycol (MIRALAX / GLYCOLAX) packet, Take 17 g by mouth daily., Disp: , Rfl:  .  ranitidine (ZANTAC) 150 MG tablet, Twice daily, morning and evening., Disp: 60 tablet,  Rfl: 11 .  senna (SENOKOT) 8.6 MG tablet, Take 1 tablet by mouth daily., Disp: , Rfl:  .  torsemide (DEMADEX) 10 MG tablet, Take 15 mg by mouth daily. , Disp: , Rfl:  .  traZODone (DESYREL) 50 MG tablet, Take 2 tabs by mouth at bedtime, Disp: 62 tablet, Rfl: 5 .  azithromycin (ZITHROMAX) 250 MG tablet, Take 1 tablet (250 mg total) by mouth daily. Take first 2 tablets together, then 1 every day until finished. (Patient not taking: Reported on 02/13/2016), Disp: 6 tablet, Rfl: 0 .  benzonatate (TESSALON) 200 MG capsule, Take 1 capsule (200 mg total) by mouth 3 (three) times daily as needed for cough., Disp: 30 capsule, Rfl: 0 .  cefdinir (OMNICEF) 300 MG capsule, Take 1 capsule (300 mg total) by mouth 2 (two) times daily. (Patient not taking: Reported on 02/13/2016), Disp: 20 capsule, Rfl: 0 .  fluconazole (DIFLUCAN) 200 MG tablet, Take 1 tablet (200 mg total) by mouth once. Repeat dose in 48-72 hours., Disp: 3 tablet, Rfl: 4 .  hydrocortisone valerate ointment (WESTCORT) 0.2 %, Apply 1 application topically 2 (two) times daily. (Patient not taking: Reported on 02/13/2016), Disp: 45 g, Rfl: 0 .  Multiple Vitamin (MULTIVITAMIN WITH MINERALS) TABS tablet, Take 1 tablet by mouth daily., Disp: , Rfl:  .  nystatin-triamcinolone (MYCOLOG II) cream, Apply 1 application topically daily as needed. Reported on 08/08/2015, Disp: , Rfl: 0 .  Olopatadine HCl 0.6 % SOLN, Use one spray twice daily for stuffy nose or drainage., Disp: 1 Bottle, Rfl: 5 .  ondansetron (ZOFRAN) 4 MG tablet, Take 1 tablet (4 mg total) by mouth every 6 (six) hours., Disp: 12 tablet, Rfl: 0 .  predniSONE (DELTASONE) 10 MG tablet, Take 2 tablets (20 mg total) by mouth daily. (Patient not taking: Reported on 02/13/2016), Disp: 16 tablet, Rfl: 0 .  vitamin C (ASCORBIC ACID) 500 MG tablet, Take 500 mg by mouth daily. Reported on 09/07/2015, Disp: , Rfl:  Allergies  Allergen Reactions  . Abilify [Aripiprazole] Swelling and Palpitations  .  Seroquel [Quetiapine Fumarate] Palpitations  . Amoxicillin-Pot Clavulanate Hives  . Keppra [Levetiracetam] Other (See Comments)    Aggressive behavior     Social History  Substance Use Topics  . Smoking status: Never Smoker  . Smokeless tobacco: Never Used  . Alcohol use No    Family History  Problem Relation Age of Onset  . Seizures Mother   . Seizures Sister     1 sister has  . Pancreatic cancer Sister   . Colon cancer Neg Hx   . Colon polyps Neg Hx   . Esophageal cancer Neg Hx   . Rectal cancer Neg Hx   . Stomach cancer Neg Hx       Review of Systems  Constitutional: negative for fatigue and weight loss, + obesity Respiratory: negative for cough and wheezing Cardiovascular: negative for chest pain,  fatigue and palpitations Gastrointestinal: negative for abdominal pain and change in bowel habits Musculoskeletal:negative for myalgias Neurological: negative for gait problems and tremors Behavioral/Psych: negative for abusive relationship, depression Endocrine: negative for temperature intolerance    Genitourinary:negative for abnormal menstrual periods, genital lesions, hot flashes, sexual problems and vaginal discharge Integument/breast: negative for breast lump, breast tenderness, nipple discharge and skin lesion(s)    Objective:       BP 105/70   Temp 98.6 F (37 C)   Wt 189 lb 4.8 oz (85.9 kg)   BMI 40.25 kg/m  General:   alert  Skin:   no rash or abnormalities  Lungs:   clear to auscultation bilaterally  Heart:   regular rate and rhythm, S1, S2 normal, no murmur, click, rub or gallop  Breasts:   normal without suspicious masses, skin or nipple changes or axillary nodes  Abdomen:  normal findings: no organomegaly, soft, non-tender and no hernia obese  Pelvis:  External genitalia: normal general appearance, large mons pubis Urinary system: urethral meatus normal and bladder without fullness, nontender, has incontenence Vaginal: unable to perform, very small  introitus Cervix: unable to perform Adnexa: unable to perform Uterus: unable to perform   Lab Review Urine pregnancy test Labs reviewed yes Radiologic studies reviewed no  50% of 30 min visit spent on counseling and coordination of care.    Assessment:    Healthy female exam.    Contraception management: Seasonique  Yeast Vaginitis  Mental retardation  Epilepsy  Chronic constipation  H/O N&V  H/O Asthma  Gait disorder   Autism   Plan:   Needs exam under anesthesia, unable to tolerate vaginal touching/exam.     Meds ordered this encounter  Medications  . fluconazole (DIFLUCAN) 200 MG tablet    Sig: Take 1 tablet (200 mg total) by mouth once. Repeat dose in 48-72 hours.    Dispense:  3 tablet    Refill:  4  . Levonorgestrel-Ethinyl Estradiol (SEASONIQUE) 0.15-0.03 &0.01 MG tablet    Sig: Take 1 tablet by mouth daily. Take 1 active tablet daily, skip non active pills.    Dispense:  1 Package    Refill:  11    Pt is taking active pills continously.   Orders Placed This Encounter  Procedures  . NuSwab Vaginitis Plus (VG+)

## 2016-02-14 ENCOUNTER — Encounter (HOSPITAL_COMMUNITY): Payer: Self-pay

## 2016-02-15 ENCOUNTER — Encounter: Payer: Self-pay | Admitting: Certified Nurse Midwife

## 2016-02-15 LAB — NUSWAB VAGINITIS PLUS (VG+)
Candida albicans, NAA: NEGATIVE
Candida glabrata, NAA: NEGATIVE
Chlamydia trachomatis, NAA: NEGATIVE
NEISSERIA GONORRHOEAE, NAA: NEGATIVE
Trich vag by NAA: NEGATIVE

## 2016-02-16 DIAGNOSIS — J310 Chronic rhinitis: Secondary | ICD-10-CM | POA: Diagnosis not present

## 2016-02-21 ENCOUNTER — Telehealth (INDEPENDENT_AMBULATORY_CARE_PROVIDER_SITE_OTHER): Payer: Self-pay

## 2016-02-21 ENCOUNTER — Telehealth: Payer: Self-pay | Admitting: Neurology

## 2016-02-21 DIAGNOSIS — G4733 Obstructive sleep apnea (adult) (pediatric): Secondary | ICD-10-CM

## 2016-02-21 DIAGNOSIS — F840 Autistic disorder: Secondary | ICD-10-CM

## 2016-02-21 NOTE — Telephone Encounter (Signed)
Sleep study done 01/11/2015. Do you want me to bring her in for an appointment?

## 2016-02-21 NOTE — Telephone Encounter (Signed)
Thank you, noted.  [facility]

## 2016-02-21 NOTE — Telephone Encounter (Signed)
[name] called said she was contacted 4-5 mths ago and asked what she wanted to do- oral appliance or CPAP. She said she told the person to let her think about it. She said the person advised her to call back when she has decided what she wants to do. [name] said the pt will use CPAP.  I advised her there was not any documentation and did not know who contacted her but I would send the msg Please call

## 2016-02-21 NOTE — Telephone Encounter (Signed)
[name], mom, lvm stating that they are trying to get [facility] and possible [facility]. [name] would like the number for the provider that did the Sleep Test. CB# [phone]

## 2016-02-21 NOTE — Telephone Encounter (Signed)
Can go directly on to CPAP titration. CD

## 2016-02-21 NOTE — Telephone Encounter (Signed)
I called and talked to Mom. She said that Analaura's ENT doctor recommended that she have CPAP and told her to contact Dr Dohmeier's office. [name] said that she hasn't been able to get through to that office and asked if I knew another number to try. I told her that there was not another number and suggested that she continue to try, or ask the ENT doctor to contact Dr Dohmeier directly. Mom agreed with this plan. TG

## 2016-02-22 NOTE — Telephone Encounter (Signed)
Titration study placed

## 2016-02-23 NOTE — Telephone Encounter (Signed)
[name] called back. Operator directed to her to [facility]. She will leave a msg on her VM

## 2016-02-25 ENCOUNTER — Encounter (HOSPITAL_COMMUNITY): Payer: Self-pay | Admitting: Emergency Medicine

## 2016-02-25 DIAGNOSIS — J320 Chronic maxillary sinusitis: Secondary | ICD-10-CM | POA: Diagnosis not present

## 2016-02-25 DIAGNOSIS — F840 Autistic disorder: Secondary | ICD-10-CM | POA: Insufficient documentation

## 2016-02-25 DIAGNOSIS — R0981 Nasal congestion: Secondary | ICD-10-CM | POA: Diagnosis present

## 2016-02-25 DIAGNOSIS — R05 Cough: Secondary | ICD-10-CM | POA: Diagnosis not present

## 2016-02-25 DIAGNOSIS — J45909 Unspecified asthma, uncomplicated: Secondary | ICD-10-CM | POA: Diagnosis not present

## 2016-02-25 NOTE — ED Triage Notes (Addendum)
Per family member, pt been having URI symptoms x10 weeks. Hx of bronchitis. Congestion. Cough. Sees a heart doctor and a lung doctor. Pt has MR

## 2016-02-26 ENCOUNTER — Emergency Department (HOSPITAL_COMMUNITY): Payer: Medicare Other

## 2016-02-26 ENCOUNTER — Telehealth: Payer: Self-pay | Admitting: *Deleted

## 2016-02-26 ENCOUNTER — Emergency Department (HOSPITAL_COMMUNITY)
Admission: EM | Admit: 2016-02-26 | Discharge: 2016-02-26 | Disposition: A | Discharge status: Home / Self Care | Payer: Medicare Other | Attending: Emergency Medicine | Admitting: Emergency Medicine

## 2016-02-26 DIAGNOSIS — R05 Cough: Secondary | ICD-10-CM | POA: Diagnosis not present

## 2016-02-26 DIAGNOSIS — J320 Chronic maxillary sinusitis: Secondary | ICD-10-CM | POA: Diagnosis not present

## 2016-02-26 MED ORDER — BENZONATATE 100 MG PO CAPS: 200.0000 mg | ORAL_CAPSULE | Freq: Two times a day (BID) | ORAL | 0 refills | Status: DC | PRN

## 2016-02-26 MED ORDER — AZITHROMYCIN 250 MG PO TABS: 250.0000 mg | ORAL_TABLET | Freq: Every day | ORAL | 0 refills | Status: DC

## 2016-02-26 MED ORDER — DOXYCYCLINE HYCLATE 100 MG PO CAPS: 100.0000 mg | ORAL_CAPSULE | Freq: Two times a day (BID) | ORAL | 0 refills | Status: DC

## 2016-02-26 MED ORDER — OXYMETAZOLINE HCL 0.05 % NA SOLN: 1.0000 | Freq: Two times a day (BID) | NASAL | 0 refills | Status: DC

## 2016-02-26 NOTE — Telephone Encounter (Signed)
Patient's mother called stating that she spoke with Dawn, sleep lab and was told that patient needed a sleep study done. Mother stated that Dr Vickey Hugerohmeier said patient did not need another sleep study, can begin CPAP. Mother requested RN call her back regarding this issue. She stated her daughter had to go to ED last night due to issues with breathing. Please call mother.

## 2016-02-26 NOTE — ED Notes (Signed)
Transported to xray 

## 2016-02-26 NOTE — Discharge Instructions (Signed)
Take your antibiotic as prescribed until completed. I also recommend using your nose spray and cough medicine as prescribed as needed. Continue drinking fluids at home to remain hydrated. I also recommend continuing to use her breathing treatments at home as needed for shortness of breath/wheezing. Follow-up with your primary care provider in the next 3-4 days if your symptoms have not improved. Please return to the Emergency Department if symptoms worsen or new onset of fever, headache, facial/neck swelling, difficulty breathing, coughing up blood, chest pain, vomiting, unable to keep fluids down.

## 2016-02-26 NOTE — ED Provider Notes (Signed)
[facility]-EMERGENCY DEPT Provider Note   CSN: [id number] Arrival date & time: 02/25/16  2236     History   Chief Complaint No chief complaint on file.   HPI Lori Miles is a 20 y.o. female.  HPI   Patient is a 20 year old female with history of autism, MR and epilepsy presents the ED with complaint of URI symptoms. Mother reports patient has continued to have upper respiratory symptoms for the past 10 weeks. She reports she has been seen in the ED and by her PCP multiple times for these symptoms. Mother reports patient initially received antibiotics including Ceftin air and Z-Pak, notes patient last took antibiotics over a month ago. Mother states over the past week patient has had worsening nasal congestion, facial swelling, sinus pressure, productive cough, sore throat, wheezing. Mother states she has been giving the patient her home neb treatments with mild intermittent relief. Mother also reports pt had low-grade fever this week (temp 100 at home). Mother also reports pt has had multiple episodes of post-tussive emesis but notes pt has been tolerating PO at home. Denies any known sick contacts. Denies any recent hospitalizations. Denies chills, body aches, hemoptysis, shortness of breath, chest pain, abdominal pain, diarrhea, rash. Mother reports patient has history of sinus infections and reports patient needs nasal surgery but physician is unwilling to perform the surgery due to patient's other medical problems.  Past Medical History:  Diagnosis Date  . Allergy   . Asthma   . Autism    mom states no actual dx, but admits she has some symptoms  . Bronchitis   . Constipation   . Frequency-urgency syndrome     some incontinence ; wears pull-ups  . Interstitial cystitis   . Mental deficiency    hx of aggressive behavior,impaired speech   . Pneumonia   . Seizure (HCC)    most recent sz " at the end of summer"-last sz years ago per mother    Patient Active Problem List   Diagnosis Date Noted  . Insomnia 10/04/2015  . Acute bronchitis 05/05/2015  . Dyspnea 05/05/2015  . Gait disorder 03/22/2015  . 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 Cough variant asthma vs UACS/ vcd  03/15/2015  . CAP (community acquired pneumonia) 02/14/2015  . Autism spectrum disorder 02/14/2015  . Acute respiratory failure (HCC) 02/14/2015  . Nausea & vomiting 02/14/2015  . Disturbance in sleep behavior 12/13/2014  . Obesity, morbid (HCC) 12/13/2014  . 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 Acanthosis nigricans 11/14/2014  . Toeing-in 08/29/2014  . Obesity (BMI 30-39.9) 08/23/2013  . 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 Interstitial cystitis 04/28/2012  . Recurrent urinary tract infection 08/01/2011  . Asthma 08/01/2011  . Chronic constipation 08/01/2011  . Contraception 08/01/2011    Past Surgical History:  Procedure Laterality Date  . CYSTOSCOPY    . CYSTOSCOPY WITH HYDRODISTENSION AND BIOPSY  04/14/2012   Procedure: CYSTOSCOPY/BIOPSY/HYDRODISTENSION;  Surgeon: Lindaann Slough, MD;  Location: [facility];  Service: Urology;;  instillation of marcaine and pyridium    OB History    No data available       Home Medications    Prior to Admission medications   Medication Sig Start Date End Date Taking? Authorizing Provider  albuterol (PROVENTIL HFA;VENTOLIN HFA) 108 (  90 Base) MCG/ACT inhaler Inhale 1-2 puffs into the lungs every 6 (six) hours as needed for wheezing or shortness of breath. 02/07/16   Glynn Octave, MD  azithromycin (ZITHROMAX) 250 MG tablet Take 1 tablet (250 mg total) by mouth daily. Take first 2 tablets together, then 1 every day until finished. Patient not  taking: Reported on 02/13/2016 01/30/16   Deatra Canter, FNP  benzonatate (TESSALON) 100 MG capsule Take 2 capsules (200 mg total) by mouth 2 (two) times daily as needed for cough. 02/26/16   Trussell Henle, PA-C  budesonide (PULMICORT) 0.5 MG/2ML nebulizer solution Take 2 mLs (0.5 mg total) by nebulization 2 (two) times daily. One vial twice daily 03/28/15   Baxter Hire, MD  cefdinir (OMNICEF) 300 MG capsule Take 1 capsule (300 mg total) by mouth 2 (two) times daily. Patient not taking: Reported on 02/13/2016 01/24/16   Linna Hoff, MD  cetirizine (ZYRTEC) 10 MG tablet Take one tablet daily for runny nose or itching. 09/01/15   Roselyn Kara Mead, MD  cloNIDine (CATAPRES) 0.1 MG tablet Take 1+1/2  tab by mouth at bedtime. 10/04/15   Deetta Perla, MD  Cranberry 500 MG CAPS Take 500 mg by mouth daily. Reported on 09/07/2015    Historical Provider, MD  DEPAKOTE 500 MG DR tablet Take 1 tablet twice per day 10/04/15   Deetta Perla, MD  doxycycline (VIBRAMYCIN) 100 MG capsule Take 1 capsule (100 mg total) by mouth 2 (two) times daily. 02/26/16   Manrique Henle, PA-C  hydrocortisone valerate ointment (WESTCORT) 0.2 % Apply 1 application topically 2 (two) times daily. Patient not taking: Reported on 02/13/2016 12/18/15   Eustace Moore, MD  hyoscyamine (LEVSIN SL) 0.125 MG SL tablet DISSOLVE 1 TABLET UNDER THE TONGUE 2 TIMES A DAY 08/08/14   Historical Provider, MD  ipratropium (ATROVENT) 0.06 % nasal spray Place 2 sprays into both nostrils 4 (four) times daily. 09/18/15   Linna Hoff, MD  Levonorgestrel-Ethinyl Estradiol (SEASONIQUE) 0.15-0.03 &0.01 MG tablet Take 1 tablet by mouth daily. Take 1 active tablet daily, skip non active pills. 02/13/16   Rachelle A Denney, CNM  Melatonin 3 MG TABS Take 3 mg by mouth at bedtime. For insomnia    Historical Provider, MD  montelukast (SINGULAIR) 10 MG tablet Take one tablet each evening to prevent cough or wheeze. 09/07/15   Roselyn Kara Mead, MD  Multiple Vitamin (MULTIVITAMIN WITH MINERALS) TABS tablet Take 1 tablet by mouth daily.    Historical Provider, MD  nystatin-triamcinolone (MYCOLOG II) cream Apply 1 application topically daily as needed. Reported on 08/08/2015 02/10/15   Historical Provider, MD  Olopatadine HCl 0.6 % SOLN Use one spray twice daily for stuffy nose or drainage. 03/08/15   Roselyn Kara Mead, MD  ondansetron (ZOFRAN ODT) 8 MG disintegrating tablet Take 1 tablet (8 mg total) by mouth every 8 (eight) hours as needed for nausea or vomiting. 02/15/15   Joseph Art, DO  ondansetron (ZOFRAN) 4 MG tablet Take 1 tablet (4 mg total) by mouth every 6 (six) hours. 12/31/15   Tharon Aquas, PA  oxymetazoline (AFRIN NASAL SPRAY) 0.05 % nasal spray Place 1 spray into both nostrils 2 (two) times daily. Spray once into each nostril twice daily for up to the next 3 days. Do not use for more than 3 days to prevent rebound rhinorrhea. 02/26/16   Cisnero Henle, PA-C  polyethylene glycol [facility] / GLYCOLAX) packet Take 17 g by  mouth daily.    Historical Provider, MD  predniSONE (DELTASONE) 10 MG tablet Take 2 tablets (20 mg total) by mouth daily. Patient not taking: Reported on 02/13/2016 01/29/16   Janne NapoleonHope M Neese, NP  ranitidine (ZANTAC) 150 MG tablet Twice daily, morning and evening. 08/15/15   Beverley FiedlerJay M Pyrtle, MD  senna (SENOKOT) 8.6 MG tablet Take 1 tablet by mouth daily.    Historical Provider, MD  torsemide (DEMADEX) 10 MG tablet Take 15 mg by mouth daily.     Historical Provider, MD  traZODone (DESYREL) 50 MG tablet Take 2 tabs by mouth at bedtime 10/04/15   Deetta PerlaWilliam H Hickling, MD  vitamin C (ASCORBIC ACID) 500 MG tablet Take 500 mg by mouth daily. Reported on 09/07/2015    Historical Provider, MD    Family History Family History  Problem Relation Age of Onset  . Seizures Mother   . Seizures Sister     1 sister has  . Pancreatic cancer Sister   . Colon cancer Neg Hx   . Colon polyps Neg Hx   . Esophageal  cancer Neg Hx   . Rectal cancer Neg Hx   . Stomach cancer Neg Hx     Social History Social History  Substance Use Topics  . Smoking status: Never Smoker  . Smokeless tobacco: Never Used  . Alcohol use No     Allergies   Abilify [aripiprazole]; Seroquel [quetiapine fumarate]; Amoxicillin-pot clavulanate; and Keppra [levetiracetam]   Review of Systems Review of Systems  Constitutional: Positive for fever.  HENT: Positive for congestion, facial swelling, rhinorrhea, sinus pain and sore throat.   Respiratory: Positive for cough and wheezing.   All other systems reviewed and are negative.    Physical Exam Updated Vital Signs BP 105/79   Pulse 98   Temp 97.6 F (36.4 C) (Oral)   Resp 18   SpO2 100%   Physical Exam  Constitutional: She is oriented to person, place, and time. She appears well-developed and well-nourished. No distress.  HENT:  Head: Normocephalic and atraumatic.  Right Ear: Tympanic membrane normal.  Left Ear: Tympanic membrane normal.  Nose: Rhinorrhea present. Right sinus exhibits maxillary sinus tenderness. Right sinus exhibits no frontal sinus tenderness. Left sinus exhibits maxillary sinus tenderness. Left sinus exhibits no frontal sinus tenderness.  Mouth/Throat: Uvula is midline, oropharynx is clear and moist and mucous membranes are normal. No oropharyngeal exudate, posterior oropharyngeal edema, posterior oropharyngeal erythema or tonsillar abscesses. No tonsillar exudate.  Post-nasal drip present.  Eyes: Conjunctivae and EOM are normal. Pupils are equal, round, and reactive to light. Right eye exhibits no discharge. Left eye exhibits no discharge. No scleral icterus.  Neck: Normal range of motion. Neck supple.  Cardiovascular: Normal rate, regular rhythm, normal heart sounds and intact distal pulses.   HR 97  Pulmonary/Chest: Effort normal and breath sounds normal. No respiratory distress. She has no wheezes. She has no rales. She exhibits no  tenderness.  Abdominal: Soft. Bowel sounds are normal. She exhibits no distension and no mass. There is no tenderness. There is no rebound and no guarding. No hernia.  Musculoskeletal: Normal range of motion. She exhibits no edema.  Lymphadenopathy:    She has no cervical adenopathy.  Neurological: She is alert and oriented to person, place, and time.  Skin: Skin is warm and dry. She is not diaphoretic.  Nursing note and vitals reviewed.    ED Treatments / Results  Labs (all labs ordered are listed, but only abnormal results are displayed)  Labs Reviewed - No data to display  EKG  EKG Interpretation None       Radiology Dg Chest 2 View  Result Date: 02/26/2016 CLINICAL DATA:  20 year old female with cough and congestion and fever EXAM: CHEST  2 VIEW COMPARISON:  Chest radiograph dated 02/07/2016 FINDINGS: There is shallow inspiration. No focal consolidation, pleural effusion, or pneumothorax. The cardiac silhouette is within normal limits. No acute osseous pathology identified. IMPRESSION: No active cardiopulmonary disease. Electronically Signed   By: Elgie CollardArash  Radparvar M.D.   On: 02/26/2016 04:00    Procedures Procedures (including critical care time)  Medications Ordered in ED Medications - No data to display   Initial Impression / Assessment and Plan / ED Course  I have reviewed the triage vital signs and the nursing notes.  Pertinent labs & imaging results that were available during my care of the patient were reviewed by me and considered in my medical decision making (see chart for details).  Clinical Course    Patient presents with worsening cough, wheezing, nasal congestion, sinus pressure and sore throat. Mother reports patient having a low-grade fever at home this week. She notes the patient has had the symptoms for the past 10 weeks and has been seen by her PCP and in the ED multiple times. Mother reports patient last used antibiotics over a month ago. VSS. Exam  revealed rhinorrhea and bilateral maxillary sinus tenderness. Remaining exam unremarkable. Lungs clear to auscultation bilaterally. Patient is laying in bed resting comfortably, well nontoxic appearing.  Chart review shows patient was last seen in the ED on 02/07/16. Workup revealed negative chest x-ray, negative strep, negative d-dimer, negative mono. Patient was diagnosed with URI and discharged home with symptomatic treatment.  Chest x-ray negative. On reexamination patient is resting, sleeping in bed comfortably. No signs of respiratory distress. Suspect patient's symptoms are likely due to to sinusitis. Due to mother reporting continuation of symptoms over the past few weeks with worsening of symptoms and associated low-grade fever this week will discharge patient home with antibiotics, decongestion and antitussive. Discussed with mother (of following up with PCP for further management and reevaluation. Discussed return precautions.   Final Clinical Impressions(s) / ED Diagnoses   Final diagnoses:  Maxillary sinusitis, unspecified chronicity    New Prescriptions New Prescriptions   BENZONATATE (TESSALON) 100 MG CAPSULE    Take 2 capsules (200 mg total) by mouth 2 (two) times daily as needed for cough.   DOXYCYCLINE (VIBRAMYCIN) 100 MG CAPSULE    Take 1 capsule (100 mg total) by mouth 2 (two) times daily.   OXYMETAZOLINE (AFRIN NASAL SPRAY) 0.05 % NASAL SPRAY    Place 1 spray into both nostrils 2 (two) times daily. Spray once into each nostril twice daily for up to the next 3 days. Do not use for more than 3 days to prevent rebound rhinorrhea.     Satira Sarkicole Elizabeth SourisNadeau, [place] JerseyPA-C 02/26/16 [id number]    Layla MawKristen N Ward, DO 03/01/16 0010

## 2016-02-26 NOTE — ED Notes (Signed)
School note given to pt.

## 2016-02-27 ENCOUNTER — Telehealth: Payer: Self-pay | Admitting: Neurology

## 2016-02-27 NOTE — Telephone Encounter (Signed)
She needs a new sleep study to get a CPAP- one year past is too long. May see Np.

## 2016-02-27 NOTE — Telephone Encounter (Signed)
Sleep study is over a year old and patient has not been seen in over a year. Patient needs appt with NP per Dr. Vickey Hugerohmeier.

## 2016-02-27 NOTE — Telephone Encounter (Signed)
I spoke to mother and gave verbal recommendations from Dr. Vickey Hugerohmeier. Sleep study is over a year old, last seen over a year. Patient needs to come in and be seen , by NP if needed. Per Dr. Vickey Hugerohmeier, NP can wrote orders for patient to start AutoPAP.  Mother was unable to make appt at this time but states that she will call back in morning to make appt.

## 2016-02-27 NOTE — Telephone Encounter (Signed)
Patient has Medicare/Medicaid and will need an office visit before she can be schedule for a sleep study.  It has been over a year since she has been in the office.  She also just wants the CPAP without having a sleep study.

## 2016-02-27 NOTE — Telephone Encounter (Signed)
See other phone note. Sleep study is over a year old and patient has not been seen in over a year. Patient needs appt with NP per Dr. Vickey Hugerohmeier.

## 2016-02-28 ENCOUNTER — Telehealth: Payer: Self-pay | Admitting: Neurology

## 2016-02-28 DIAGNOSIS — J302 Other seasonal allergic rhinitis: Secondary | ICD-10-CM | POA: Diagnosis not present

## 2016-02-28 DIAGNOSIS — G4730 Sleep apnea, unspecified: Secondary | ICD-10-CM | POA: Diagnosis not present

## 2016-02-28 DIAGNOSIS — J069 Acute upper respiratory infection, unspecified: Secondary | ICD-10-CM | POA: Diagnosis not present

## 2016-02-28 DIAGNOSIS — J4520 Mild intermittent asthma, uncomplicated: Secondary | ICD-10-CM | POA: Diagnosis not present

## 2016-02-28 NOTE — Telephone Encounter (Signed)
Patient's mother called again today to speak with nurse.  She doesn't want an appointment but said at the end of the call she would schedule.  But I didn't get her scheduled.  She would like to talk to the nurse again.

## 2016-02-28 NOTE — Telephone Encounter (Signed)
[name] called back. Pt is scheduled for 10:00 tomorrow with Eber JonesCarolyn but mother is going to try to find a ride. I will call her back at 4:30 for status of transportation as she said she has tried all day to get thru on the phones.

## 2016-02-28 NOTE — Telephone Encounter (Signed)
Spoke with pt's mother, confirmed she will be at appt tomorrow

## 2016-02-29 ENCOUNTER — Encounter: Payer: Self-pay | Admitting: *Deleted

## 2016-02-29 ENCOUNTER — Telehealth (INDEPENDENT_AMBULATORY_CARE_PROVIDER_SITE_OTHER): Payer: Self-pay

## 2016-02-29 ENCOUNTER — Encounter: Payer: Self-pay | Admitting: Nurse Practitioner

## 2016-02-29 ENCOUNTER — Ambulatory Visit (INDEPENDENT_AMBULATORY_CARE_PROVIDER_SITE_OTHER): Payer: Self-pay | Admitting: Nurse Practitioner

## 2016-02-29 VITALS — BP 102/65 | HR 101 | Ht <= 58 in | Wt 189.8 lb

## 2016-02-29 DIAGNOSIS — G4700 Insomnia, unspecified: Secondary | ICD-10-CM

## 2016-02-29 NOTE — Telephone Encounter (Addendum)
I called and talked to Lori Miles. She said that Lori Miles had an appointment today at Lori Miles with [facility] NP and that it didn't go well. She said that Lori Miles saw her because Lori Miles was out of the office and that she was told to be seen to get an order for CPAP equipment. Lori Miles said that Lori Miles has been seen by ENT and having problems with gasping and choking in her sleep. Lori Miles has video of this and when her ENT saw the video recommended CPAP. Lori Miles said that Lori Miles told her that Lori Miles would order for her but when she arrived, Lori Miles told her no and that she would need new sleep study to prove need. Lori Miles said that they argued about it, because Lori Miles is sleeping so poorly now that she doesn't think that she can do another sleep study and that Lori Miles ended up walking out of the visit and refused to return to discuss it. [facility]. Lori Miles asked for help with the situation. Because of the difficulties with understanding this situation, I asked Lori Miles to bring Lori Miles and the videos in on Tuesday Dec [facility] at [facility] so that I can better determine if there is anything that I can do to help sort this out. I will consult with Lori Miles at the time of the visit. Lori Miles agreed with this plan. TG

## 2016-02-29 NOTE — Telephone Encounter (Signed)
Noted, thank you

## 2016-02-29 NOTE — Progress Notes (Signed)
[facility]  PATIENT: Lori Miles DOB: [birth year]   REASON FOR VISIT: Follow-up for sleep issues HISTORY FROM: Mom    HISTORY OF PRESENT ILLNESS: Mom is here because she has been told patient the patient can be  set up for auto pap . I tried to explain to the mother that her most recent sleep study was negative for obstructive sleep apnea and therefore we cannot order equipment. She claims Dr. Annalee Genta and Dr. Vickey Huger discussed this however I do not see any documentation in the chart. Butch Penny then came in and talked to mom because she was adamant that she was not leaving  until she got the equipment order. Aundra Millet made the mother aware there  would be no charge for this visit.   Mother to get note from Dr. Annalee Genta for [name].  Will be speaking to Dr. Vickey Huger on monday about this, and she will need to call mother.     REVIEW OF SYSTEMS: Full 14 system review of systems performed and notable only for those listed, all others are neg:  Constitutional: neg  Cardiovascular: neg Ear/Nose/Throat: neg  Skin: neg Eyes: neg Respiratory: neg Gastroitestinal: neg  Hematology/Lymphatic: neg  Endocrine: neg Musculoskeletal:neg Allergy/Immunology: neg Neurological: neg Psychiatric: neg Sleep : neg   ALLERGIES: Allergies  Allergen Reactions  . Abilify [Aripiprazole] Swelling and Palpitations  . Seroquel [Quetiapine Fumarate] Palpitations  . Doxycycline Other (See Comments)    Stomach cramps  . Amoxicillin-Pot Clavulanate Hives  . Keppra [Levetiracetam] Other (See Comments)    Aggressive behavior     HOME MEDICATIONS: Outpatient Medications Prior to Visit  Medication Sig Dispense Refill  . albuterol (PROVENTIL HFA;VENTOLIN HFA) 108 (90 Base) MCG/ACT inhaler Inhale 1-2 puffs into the lungs every 6 (six) hours as needed for wheezing or shortness of breath. 1 Inhaler 0  . azithromycin (ZITHROMAX) 250 MG tablet Take 1 tablet (250 mg total) by mouth  daily. Take first 2 tablets together, then 1 every day until finished. 6 tablet 0  . benzonatate (TESSALON) 100 MG capsule Take 2 capsules (200 mg total) by mouth 2 (two) times daily as needed for cough. 20 capsule 0  . budesonide (PULMICORT) 0.5 MG/2ML nebulizer solution Take 2 mLs (0.5 mg total) by nebulization 2 (two) times daily. One vial twice daily 180 mL 4  . cetirizine (ZYRTEC) 10 MG tablet Take one tablet daily for runny nose or itching. 30 tablet 5  . cloNIDine (CATAPRES) 0.1 MG tablet Take 1+1/2  tab by mouth at bedtime. 45 tablet 5  . Cranberry 500 MG CAPS Take 500 mg by mouth daily. Reported on 09/07/2015    . DEPAKOTE 500 MG DR tablet Take 1 tablet twice per day (Patient taking differently: Take 1 tablet twice per day  (BRAND NAME)) 60 tablet 5  . hyoscyamine (LEVSIN SL) 0.125 MG SL tablet DISSOLVE 1 TABLET UNDER THE TONGUE 2 TIMES A DAY  0  . Levonorgestrel-Ethinyl Estradiol (SEASONIQUE) 0.15-0.03 &0.01 MG tablet Take 1 tablet by mouth daily. Take 1 active tablet daily, skip non active pills. 1 Package 11  . Melatonin 3 MG TABS Take 3 mg by mouth at bedtime. For insomnia    . montelukast (SINGULAIR) 10 MG tablet Take one tablet each evening to prevent cough or wheeze. 30 tablet 5  . ondansetron (ZOFRAN ODT) 8 MG disintegrating tablet Take 1 tablet (8 mg total) by mouth every 8 (eight) hours as needed for nausea or vomiting. 10 tablet 0  . ondansetron (ZOFRAN) 4  MG tablet Take 1 tablet (4 mg total) by mouth every 6 (six) hours. 12 tablet 0  . polyethylene glycol (MIRALAX / GLYCOLAX) packet Take 17 g by mouth daily.    . predniSONE (DELTASONE) 10 MG tablet Take 2 tablets (20 mg total) by mouth daily. 16 tablet 0  . ranitidine (ZANTAC) 150 MG tablet Twice daily, morning and evening. 60 tablet 11  . senna (SENOKOT) 8.6 MG tablet Take 1 tablet by mouth daily.    Marland Kitchen. torsemide (DEMADEX) 10 MG tablet Take 15 mg by mouth daily.     . traZODone (DESYREL) 50 MG tablet Take 2 tabs by mouth at  bedtime 62 tablet 5  . vitamin C (ASCORBIC ACID) 500 MG tablet Take 500 mg by mouth daily. Reported on 09/07/2015    . benzonatate (TESSALON) 100 MG capsule Take 2 capsules (200 mg total) by mouth 2 (two) times daily as needed for cough. (Patient not taking: Reported on 02/29/2016) 20 capsule 0  . cefdinir (OMNICEF) 300 MG capsule Take 1 capsule (300 mg total) by mouth 2 (two) times daily. (Patient not taking: Reported on 02/29/2016) 20 capsule 0  . hydrocortisone valerate ointment (WESTCORT) 0.2 % Apply 1 application topically 2 (two) times daily. (Patient not taking: Reported on 02/29/2016) 45 g 0  . ipratropium (ATROVENT) 0.06 % nasal spray Place 2 sprays into both nostrils 4 (four) times daily. (Patient not taking: Reported on 02/29/2016) 15 mL 1  . Multiple Vitamin (MULTIVITAMIN WITH MINERALS) TABS tablet Take 1 tablet by mouth daily.    Marland Kitchen. nystatin-triamcinolone (MYCOLOG II) cream Apply 1 application topically daily as needed. Reported on 08/08/2015  0  . Olopatadine HCl 0.6 % SOLN Use one spray twice daily for stuffy nose or drainage. 1 Bottle 5  . oxymetazoline (AFRIN NASAL SPRAY) 0.05 % nasal spray Place 1 spray into both nostrils 2 (two) times daily. Spray once into each nostril twice daily for up to the next 3 days. Do not use for more than 3 days to prevent rebound rhinorrhea. (Patient not taking: Reported on 02/29/2016) 30 mL 0   No facility-administered medications prior to visit.     PAST MEDICAL HISTORY: Past Medical History:  Diagnosis Date  . Allergy   . Asthma   . Autism    mom states no actual dx, but admits she has some symptoms  . Bronchitis   . Constipation   . Frequency-urgency syndrome     some incontinence ; wears pull-ups  . Interstitial cystitis   . Mental deficiency    hx of aggressive behavior,impaired speech   . Pneumonia   . Seizure (HCC)    most recent sz " at the end of summer"-last sz years ago per mother    PAST SURGICAL HISTORY: Past Surgical  History:  Procedure Laterality Date  . CYSTOSCOPY    . CYSTOSCOPY WITH HYDRODISTENSION AND BIOPSY  04/14/2012   Procedure: CYSTOSCOPY/BIOPSY/HYDRODISTENSION;  Surgeon: Lindaann SloughMarc-Henry Nesi, MD;  Location: [facility];  Service: Urology;;  instillation of marcaine and pyridium    FAMILY HISTORY: Family History  Problem Relation Age of Onset  . Seizures Mother   . Seizures Sister     1 sister has  . Pancreatic cancer Sister   . Colon cancer Neg Hx   . Colon polyps Neg Hx   . Esophageal cancer Neg Hx   . Rectal cancer Neg Hx   . Stomach cancer Neg Hx     SOCIAL HISTORY: Social History   Social  History  . Marital status: Single    Spouse name: N/A  . Number of children: N/A  . Years of education: N/A   Occupational History  . Not on file.   Social History Main Topics  . Smoking status: Never Smoker  . Smokeless tobacco: Never Used  . Alcohol use No  . Drug use: No  . Sexual activity: No   Other Topics Concern  . Not on file   Social History Narrative   Lives with mom Research scientist (physical sciences)([name]) and half-sister [name](Elder Davidian Bosak) who is also mentally impaired.  Has CAP assistance, urinary incontinence, needs help with feeding,dressing, toileting   Graduated from [employer].  She enjoys playing with cars, dancing, and dogs.     PHYSICAL EXAM  Vitals:   02/29/16 0954  BP: 102/65  Pulse: (!) 101  SpO2: 97%  Weight: 189 lb 12.8 oz (86.1 kg)  Height: 4' 9.5" (1.461 m)   Body mass index is 40.36 kg/m.    DIAGNOSTIC DATA (LABS, IMAGING, TESTING) -     ASSESSMENT AND PLAN  20 y.o. year old female  has a past medical history of Allergy; Asthma; Autism; Bronchitis; Constipation; Frequency-urgency syndrome; Interstitial cystitis; Mental deficiency; Pneumonia; and Seizure (HCC). here with sleep issues however sleep study was negative Mom will need to discuss with Dr. Vickey Hugerohmeier on her return Monday Nilda RiggsNancy Carolyn Idamay Hosein, [facility] LLCGNP, [facility]BC, [facility] HospitalPRN  [facility] [address], [address] [place], [place] [address] [phone]

## 2016-02-29 NOTE — Telephone Encounter (Signed)
"  Lori Miles", mom, lvm requesting a call back from [place]. She can be reached at : [phone]. I called mom back and it went straight to VM. The vmb was full and I was ua to leave a message.

## 2016-03-04 ENCOUNTER — Encounter: Payer: Self-pay | Admitting: Neurology

## 2016-03-04 NOTE — Telephone Encounter (Signed)
Dr. Vickey Hugerohmeier,  This patient came to see Eber JonesCarolyn on 12/14. Eber JonesCarolyn was unable to order PAP treatment because sleep study did not show OSA, AHI >1. Mother appeared to become very upset and stated that patient needs it because she is waking up at night, choking.   The mother insisted that whomever she spoke with in the CAP program (Medicaid/ Community Alternative Program for Disabled Adults) said that they will pay for CPAP with only a letter stating that she needs CPAP. I contacted her insurance/ CAP program. They state that patient will only be approved for CPAP with AHI <15, or AHI 5-14 with Dx of Excessive Daytime Sleepiness, Hypertension, Stroke, and/or Behavioral changes.  We offered the patient to take home HST, for free, to see if AHI has changed. Mother refused saying that patient will be unable to tolerate. We advised the mother that we would talk to you about next steps.  It appears from another phone note that the mother has been in contact with [facility], [facility]

## 2016-03-04 NOTE — Telephone Encounter (Signed)
FYI: Patient had a sleep study with us over a year ago. She did not have sleep apnea and slept well during the study. During the visit with Darrol Angelarolyn Martin NP, we tried to explain to the mother that insurance will not pay for CPAP if she does not have the diagnosis of OSA. The mother insisted that whomever she spoke with in the CAP program (Medicaid/ Community Alternative Program for Disabled Adults) said that they will pay for it with only a letter stating that she needs CPAP.  I called CAP and they stated that patient must have the diagnosis of OSA to be approved.  During that appointment we offered to send the patient home with a home sleep test, for free. Mother refused stating that patient would not be able to tolerate it. Dr. Vickey Hugerohmeier will be notified as well.

## 2016-03-04 NOTE — Patient Instructions (Addendum)
Your procedure is scheduled on:  Tuesday, Dec. 26, 2017  [facility]  12 [facility]  Pick up the phone at the desk and dial [phone].  Call this number if you have problems the morning of surgery: [phone].  Remember: Do NOT eat food:  After Midnight Monday  Do NOT drink clear liquids after:  9:30 AM day of surgery  Take these medicines the morning of surgery with a SIP OF WATER:  Depakote, Zantac, Torsemide, Zofran, birth control  Stop ALL herbal medications at this time   Do NOT wear jewelry (body piercing), metal hair clips/bobby pins, make-up, or nail polish. Do NOT wear lotions, powders, or perfumes.  You may wear deodorant. Do NOT shave for 48 hours prior to surgery. Do NOT bring valuables to the hospital. Contacts, dentures, or bridgework may not be worn into surgery.  Have a responsible adult drive you home and stay with you for 24 hours after your procedure

## 2016-03-04 NOTE — Telephone Encounter (Signed)
Please note transfer of care from [facility] to pulmonary sleep, per mother's request.

## 2016-03-04 NOTE — Telephone Encounter (Signed)
This note is very helpful.  I'm sorry that this was such a difficult visit.

## 2016-03-05 ENCOUNTER — Encounter (HOSPITAL_COMMUNITY)
Admission: RE | Admit: 2016-03-05 | Discharge: 2016-03-05 | Disposition: A | Discharge status: Home / Self Care | Payer: Medicare Other | Source: Ambulatory Visit | Attending: Obstetrics & Gynecology | Admitting: Obstetrics & Gynecology

## 2016-03-05 ENCOUNTER — Encounter (HOSPITAL_COMMUNITY): Payer: Self-pay

## 2016-03-05 ENCOUNTER — Ambulatory Visit (INDEPENDENT_AMBULATORY_CARE_PROVIDER_SITE_OTHER): Payer: Medicare Other | Admitting: Family

## 2016-03-05 ENCOUNTER — Encounter (INDEPENDENT_AMBULATORY_CARE_PROVIDER_SITE_OTHER): Payer: Self-pay | Admitting: *Deleted

## 2016-03-05 ENCOUNTER — Other Ambulatory Visit (INDEPENDENT_AMBULATORY_CARE_PROVIDER_SITE_OTHER): Payer: Self-pay

## 2016-03-05 ENCOUNTER — Telehealth (INDEPENDENT_AMBULATORY_CARE_PROVIDER_SITE_OTHER): Payer: Self-pay

## 2016-03-05 VITALS — BP 106/68 | HR 84 | Ht <= 58 in | Wt 185.6 lb

## 2016-03-05 DIAGNOSIS — G40209 Localization-related (focal) (partial) symptomatic epilepsy and epileptic syndromes with complex partial seizures, not intractable, without status epilepticus: Secondary | ICD-10-CM | POA: Diagnosis not present

## 2016-03-05 DIAGNOSIS — Z01818 Encounter for other preprocedural examination: Secondary | ICD-10-CM | POA: Insufficient documentation

## 2016-03-05 DIAGNOSIS — G4719 Other hypersomnia: Secondary | ICD-10-CM | POA: Diagnosis not present

## 2016-03-05 DIAGNOSIS — G40309 Generalized idiopathic epilepsy and epileptic syndromes, not intractable, without status epilepticus: Secondary | ICD-10-CM

## 2016-03-05 DIAGNOSIS — G478 Other sleep disorders: Secondary | ICD-10-CM | POA: Diagnosis not present

## 2016-03-05 DIAGNOSIS — F70 Mild intellectual disabilities: Secondary | ICD-10-CM | POA: Diagnosis not present

## 2016-03-05 DIAGNOSIS — F819 Developmental disorder of scholastic skills, unspecified: Secondary | ICD-10-CM

## 2016-03-05 HISTORY — DX: Gastro-esophageal reflux disease without esophagitis: K21.9

## 2016-03-05 LAB — CBC
HCT: 35.8 % — ABNORMAL LOW (ref 36.0–46.0)
Hemoglobin: 12.2 g/dL (ref 12.0–15.0)
MCH: 30.4 pg (ref 26.0–34.0)
MCHC: 34.1 g/dL (ref 30.0–36.0)
MCV: 89.3 fL (ref 78.0–100.0)
PLATELETS: 339 10*3/uL (ref 150–400)
RBC: 4.01 MIL/uL (ref 3.87–5.11)
RDW: 13.9 % (ref 11.5–15.5)
WBC: 13.4 10*3/uL — ABNORMAL HIGH (ref 4.0–10.5)

## 2016-03-05 LAB — BASIC METABOLIC PANEL
Anion gap: 10 (ref 5–15)
BUN: 12 mg/dL (ref 6–20)
CHLORIDE: 102 mmol/L (ref 101–111)
CO2: 24 mmol/L (ref 22–32)
CREATININE: 0.84 mg/dL (ref 0.44–1.00)
Calcium: 8.7 mg/dL — ABNORMAL LOW (ref 8.9–10.3)
GFR calc Af Amer: >60 mL/min (ref >60)
GLUCOSE: 130 mg/dL — AB (ref 65–99)
POTASSIUM: 3.8 mmol/L (ref 3.5–5.1)
SODIUM: 136 mmol/L (ref 135–145)

## 2016-03-05 NOTE — Progress Notes (Signed)
Patient: Lori Miles MRN: [id number] Sex: female DOB: [birth year]  Provider: Elveria Risingina Mashal Slavick, NP Location of Care:  Child Neurology  Note type: Urgent return visit  History of Present Illness: Referral Source: Fleet ContrasEdwin Avbuere, MD History from: patient, [facility] chart and parent Chief Complaint: Epilepsy  Lori Miles is a 20 y.o. 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  She was last seen by Dr Sharene SkeansHickling on October 04, 2015. Lori Miles is seen on urgent basis today because Lori Miles called on February 29, 2016 to report that Lori Miles was experiencing choking and gasping in her sleep, and that she was frustrated with efforts to get CPAP ordered for her. Lori Miles was quite distraught and I asked her to bring [facility] in so that I could better understand this situation. Lori Miles reports to me today that Lori Miles has been sick with frequent upper respiratory infections and has had frequent trips to the ER for the past several months. Lori Miles says that she has been evaluated by her ENT, Dr Annalee GentaShoemaker and that some sort of nasal or sinus surgery was indicated but that he felt that [facility] not be a good candidate. Lori Miles says that he felt that [facility] because she awakens at night coughing, choking and gasping. [facility] Miles [facility] where she was told that she would need to repeat the sleep study and Lori Miles apparently disagreed with that recommendation. Lori Miles says that she was told by someone at the CAP services office that CPAP would be provided with an order and a letter from her doctor.   Lori Miles brought a video in for me to see today in which Latorria was asleep, awakened suddenly with coughing and choking, and ultimately coughed until she vomited mucus. Lori Miles felt that was proof that [facility] to help her sleep at night. Lori Miles says that Aracelia sleeps during the day because  she sleeps so poorly at night. Dailyn slept through most of the visit today and Lori Miles said that they were awake last night with her coughing and choking.   Lori Miles had a sleep study in October 2016 that did not show evidence of Obstructive Sleep Apnea. Lori Miles is concerned with her ongoing nighttime arousals with coughing and choking, and is quite convinced that CPAP will help her to sleep better. Lori Miles says that Lori Miles has been doing fairly well otherwise and has no other health concerns for her today other than previously mentioned.   Review of Systems: Please see the HPI for neurologic and other pertinent review of systems. Otherwise, the following systems are noncontributory including constitutional, eyes, ears, nose and throat, cardiovascular, respiratory, gastrointestinal, genitourinary, musculoskeletal, skin, endocrine, hematologic/lymph, allergic/immunologic and psychiatric.   Past Medical History:  Diagnosis Date  . Allergy   . Asthma   . Autism    Lori Miles states no actual dx, but admits she has some symptoms  . Bronchitis   . Constipation   . Frequency-urgency syndrome     some incontinence ; wears pull-ups  . Interstitial cystitis   . Mental deficiency    hx of aggressive behavior,impaired speech   . Pneumonia   . Seizure (HCC)    most recent sz " at the end of summer"-last sz years ago per Miles   Hospitalizations: No., Head Injury: No., Nervous System Infections: No., Immunizations up to date: Yes.   Past Medical History Comments: EEG on September 14, 2011, was normal. She  was able to be successfully weaned off Keppra. The patient has significant issues with aggression, which was thought to be related to Keppra, but has persisted once it was discontinued. The patient was treated with Depakote both for behavior and possible seizures. She had palpitations and severe constipation on Seroquel. She had marked weight gain on Abilify and Depakote. When generic Divalproex was tried she  had breakthrough seizures.   Nocturnal polysomnogram on October 27, 2011, failed to show significant periodic limb movements, cyanosis, sleep apnea, or cardiac arrhythmia. EEG on February 20, 2012, was a normal study with the patient awake.   Surgical History Past Surgical History:  Procedure Laterality Date  . CYSTOSCOPY    . CYSTOSCOPY WITH HYDRODISTENSION AND BIOPSY  04/14/2012   Procedure: CYSTOSCOPY/BIOPSY/HYDRODISTENSION;  Surgeon: Lindaann Slough, MD;  Location: [facility];  Service: Urology;;  instillation of marcaine and pyridium    Family History family history includes Pancreatic cancer in her sister; Seizures in her Miles and sister. Family History is otherwise negative for migraines, seizures, cognitive impairment, blindness, deafness, birth defects, chromosomal disorder, autism.  Social History Social History   Social History  . Marital status: Single    Spouse name: N/A  . Number of children: N/A  . Years of education: N/A   Social History Main Topics  . Smoking status: Never Smoker  . Smokeless tobacco: Never Used  . Alcohol use No  . Drug use: No  . Sexual activity: No   Other Topics Concern  . Not on file   Social History Narrative   Lives with Lori Miles Research scientist (physical sciences)) and half-sister Ermelinda Eckert) who is also mentally impaired.  Has CAP assistance, urinary incontinence, needs help with feeding,dressing, toileting   Graduated from [employer].  She enjoys playing with cars, dancing, and dogs.    Allergies Allergies  Allergen Reactions  . Abilify [Aripiprazole] Swelling and Palpitations  . Seroquel [Quetiapine Fumarate] Palpitations  . Doxycycline Other (See Comments)    Stomach cramps  . Amoxicillin-Pot Clavulanate Hives  . Keppra [Levetiracetam] Other (See Comments)    Aggressive behavior     Physical Exam BP 106/68   Pulse 84   Ht 4' 9.5" (1.461 m)   Wt 185 lb 9.6 oz (84.2 kg)   BMI 39.47 kg/m  General: alert, well  developed, obese with short stature, in no acute distress, black hair, brown eyes, right handed  Head: normocephalic, no dysmorphic features  Ears, Nose and Throat: Otoscopic: Tympanic membranes normal. Pharynx: oropharynx is pink without exudates or tonsillar hypertrophy.  Neck: supple, full range of motion, no cranial or cervical bruits  Respiratory: auscultation clear  Cardiovascular: no murmurs, pulses are normal  Musculoskeletal: no skeletal deformities or apparent scoliosis  Skin: no rashes or neurocutaneous lesions  Neurologic Exam  Mental Status: Slept through much of the visit, but was able to be awakened and became alert; she is able to follow some commands. She is able to name objects. She is impulsive.  Cranial Nerves: visual fields are full to double simultaneous stimuli; extraocular movements are full and conjugate; pupils are round reactive to light; funduscopic examination shows sharp disc margins with normal vessels; symmetric facial strength; midline tongue and uvula; air conduction is greater than bone conduction bilaterally.  Motor: Normal strength, tone, and mass; good fine motor movements; no pronator drift.  Sensory: intact responses to touch and temperature Coordination: good finger-to-nose, rapid repetitive alternating movements and finger apposition  Gait and Station: Gait is broad-based slightly unsteady;  balance is poor; Romberg exam is negative; Gower response is negative but difficult Reflexes: symmetric and diminished bilaterally; no clonus; bilateral flexor plantar responses.  Impression 1. 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 3. Insomnia, G47.00. 4. Mild intellectual disability, F70. 5. Sleep arousals 6. Excessive daytime sleepiness   Recommendations for plan of care The [facility] were reviewed. Lori Miles has neither had nor required imaging or lab  studies since the last visit other than x-rays and lab studies done during ER visits for respiratory infections. 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 Karalina is having frequent episodes of awakening during sleep with coughing, choking and gasping. It is not clear that the episodes are related to a sleep disorder or her respiratory infections. Lori Miles says that CPAP has been recommended and it is difficult to help her to understand that CPAP is a treatment indicated for obstructive sleep apnea. However Lori Miles is experiencing frequent arousals with the behaviors and sleeping during the day, so it is reasonable to repeat the sleep study. Lori Miles wants the study performed at [facility]. Dr Sharene SkeansHickling was consulted, came in to talk to Lori Miles and recommended a split night study. I attempted to explain to Lori Miles that if the study does not show Obstructive Sleep Apnea that CPAP will not be an indicated treatment and that we will need to follow up with her ENT for further treatment options. Lori Miles agreed with the plans made today.   The medication list was reviewed and reconciled.  No changes were made in the prescribed medications today.  A complete medication list was provided to the patient/caregiver.  Allergies as of 03/05/2016      Reactions   Abilify [aripiprazole] Swelling, Palpitations   Seroquel [quetiapine Fumarate] Palpitations   Doxycycline Other (See Comments)   Stomach cramps   Amoxicillin-pot Clavulanate Hives   Keppra [levetiracetam] Other (See Comments)   Aggressive behavior       Medication List       Accurate as of 03/05/16 11:59 PM. Always use your most recent med list.          albuterol (2.5 MG/3ML) 0.083% nebulizer solution Commonly known as:  PROVENTIL inhale contents of 1 vial in nebulizer every 4 to 6 hours if needed for cough or wheezing     azithromycin 250 MG tablet Commonly known as:  ZITHROMAX Take 1 tablet (250 mg total) by mouth daily. Take first 2 tablets together, then 1 every day until finished.   benzonatate 100 MG capsule Commonly known as:  TESSALON Take 2 capsules (200 mg total) by mouth 2 (two) times daily as needed for cough.   budesonide 0.5 MG/2ML nebulizer solution Commonly known as:  PULMICORT Take 2 mLs (0.5 mg total) by nebulization 2 (two) times daily. One vial twice daily   cetirizine 10 MG tablet Commonly known as:  ZYRTEC Take one tablet daily for runny nose or itching.   cloNIDine 0.1 MG tablet Commonly known as:  CATAPRES Take 1+1/2  tab by mouth at bedtime.   Cranberry 500 MG Caps Take 500 mg by mouth daily. Reported on 09/07/2015   DEPAKOTE 500 MG DR tablet Generic drug:  divalproex Take 1 tablet twice per day   hyoscyamine 0.125 MG SL tablet Commonly known as:  LEVSIN SL DISSOLVE 1 TABLET UNDER THE TONGUE 2 TIMES A DAY  Levonorgestrel-Ethinyl Estradiol 0.15-0.03 &0.01 MG tablet Commonly known as:  SEASONIQUE Take 1 tablet by mouth daily. Take 1 active tablet daily, skip non active pills.   Melatonin 3 MG Tabs Take 3 mg by mouth at bedtime. For insomnia   montelukast 10 MG tablet Commonly known as:  SINGULAIR Take one tablet each evening to prevent cough or wheeze.   nitrofurantoin 100 MG capsule Commonly known as:  MACRODANTIN   ondansetron 4 MG tablet Commonly known as:  ZOFRAN Take 1 tablet (4 mg total) by mouth every 6 (six) hours.   ondansetron 8 MG disintegrating tablet Commonly known as:  ZOFRAN ODT Take 1 tablet (8 mg total) by mouth every 8 (eight) hours as needed for nausea or vomiting.   polyethylene glycol powder powder Commonly known as:  GLYCOLAX/MIRALAX   predniSONE 10 MG tablet Commonly known as:  DELTASONE Take 2 tablets (20 mg total) by mouth daily.   ranitidine 150 MG tablet Commonly known as:  ZANTAC Twice daily, morning and evening.    senna 8.6 MG tablet Commonly known as:  SENOKOT Take 1 tablet by mouth daily.   torsemide 10 MG tablet Commonly known as:  DEMADEX Take 15 mg by mouth daily.   traZODone 50 MG tablet Commonly known as:  DESYREL Take 2 tabs by mouth at bedtime   vitamin C 500 MG tablet Commonly known as:  ASCORBIC ACID Take 500 mg by mouth daily. Reported on 09/07/2015       Total time spent with the patient was 35 minutes, of which 50% or more was spent in counseling and coordination of care.   Elveria Rising NP-C

## 2016-03-05 NOTE — Patient Instructions (Signed)
We will scheduled Lori Miles for a sleep study at [facility]. We will try to do it as a split night study, meaning that for part of the night they will put CPAP on to see how she does with it. Dr Sharene SkeansHickling or I will be in touch with you after we receive the sleep study results and make a treatment plan after that.   We will see Lori Miles back in follow up in 3 months or sooner if needed.

## 2016-03-05 NOTE — Telephone Encounter (Signed)
I called [name], mom, and gave her the phone number to [facility], [phone]. She will call and schedule the study. Lyndsie has Medicare and Medicaid, does not require PA.

## 2016-03-06 NOTE — Telephone Encounter (Signed)
Noted, thank you Koichi Platte!

## 2016-03-12 ENCOUNTER — Telehealth: Payer: Self-pay | Admitting: *Deleted

## 2016-03-12 ENCOUNTER — Ambulatory Visit (HOSPITAL_COMMUNITY)
Admission: RE | Admit: 2016-03-12 | Discharge: 2016-03-12 | Disposition: A | Discharge status: Home / Self Care | Payer: Medicare Other | Source: Ambulatory Visit | Attending: Obstetrics & Gynecology | Admitting: Obstetrics & Gynecology

## 2016-03-12 ENCOUNTER — Encounter (HOSPITAL_COMMUNITY)
Admission: RE | Disposition: A | Discharge status: Home / Self Care | Payer: Self-pay | Source: Ambulatory Visit | Attending: Obstetrics & Gynecology

## 2016-03-12 ENCOUNTER — Ambulatory Visit (HOSPITAL_COMMUNITY): Payer: Medicare Other | Admitting: Anesthesiology

## 2016-03-12 ENCOUNTER — Encounter (HOSPITAL_COMMUNITY): Payer: Self-pay

## 2016-03-12 DIAGNOSIS — F71 Moderate intellectual disabilities: Secondary | ICD-10-CM | POA: Insufficient documentation

## 2016-03-12 DIAGNOSIS — G40409 Other generalized epilepsy and epileptic syndromes, not intractable, without status epilepticus: Secondary | ICD-10-CM | POA: Insufficient documentation

## 2016-03-12 DIAGNOSIS — Z01419 Encounter for gynecological examination (general) (routine) without abnormal findings: Secondary | ICD-10-CM | POA: Diagnosis not present

## 2016-03-12 DIAGNOSIS — F5105 Insomnia due to other mental disorder: Secondary | ICD-10-CM | POA: Insufficient documentation

## 2016-03-12 DIAGNOSIS — N3010 Interstitial cystitis (chronic) without hematuria: Secondary | ICD-10-CM | POA: Diagnosis not present

## 2016-03-12 DIAGNOSIS — K219 Gastro-esophageal reflux disease without esophagitis: Secondary | ICD-10-CM | POA: Diagnosis not present

## 2016-03-12 DIAGNOSIS — Z800 Family history of malignant neoplasm of digestive organs: Secondary | ICD-10-CM | POA: Insufficient documentation

## 2016-03-12 DIAGNOSIS — Z881 Allergy status to other antibiotic agents status: Secondary | ICD-10-CM | POA: Insufficient documentation

## 2016-03-12 DIAGNOSIS — Z124 Encounter for screening for malignant neoplasm of cervix: Secondary | ICD-10-CM | POA: Diagnosis not present

## 2016-03-12 DIAGNOSIS — F840 Autistic disorder: Secondary | ICD-10-CM | POA: Diagnosis not present

## 2016-03-12 DIAGNOSIS — Z6841 Body Mass Index (BMI) 40.0 and over, adult: Secondary | ICD-10-CM | POA: Insufficient documentation

## 2016-03-12 DIAGNOSIS — R3915 Urgency of urination: Secondary | ICD-10-CM | POA: Insufficient documentation

## 2016-03-12 DIAGNOSIS — Z888 Allergy status to other drugs, medicaments and biological substances status: Secondary | ICD-10-CM | POA: Diagnosis not present

## 2016-03-12 DIAGNOSIS — G4730 Sleep apnea, unspecified: Secondary | ICD-10-CM | POA: Diagnosis not present

## 2016-03-12 DIAGNOSIS — F79 Unspecified intellectual disabilities: Secondary | ICD-10-CM | POA: Diagnosis not present

## 2016-03-12 DIAGNOSIS — J45909 Unspecified asthma, uncomplicated: Secondary | ICD-10-CM | POA: Insufficient documentation

## 2016-03-12 DIAGNOSIS — R350 Frequency of micturition: Secondary | ICD-10-CM | POA: Diagnosis not present

## 2016-03-12 DIAGNOSIS — Z880 Allergy status to penicillin: Secondary | ICD-10-CM | POA: Insufficient documentation

## 2016-03-12 HISTORY — DX: Nausea with vomiting, unspecified: R11.2

## 2016-03-12 HISTORY — DX: Other specified postprocedural states: Z98.890

## 2016-03-12 SURGERY — EXAM UNDER ANESTHESIA
Anesthesia: Monitor Anesthesia Care

## 2016-03-12 MED ORDER — ONDANSETRON HCL 4 MG/2ML IJ SOLN
INTRAMUSCULAR | Status: DC | PRN
  Administered 2016-03-12: 4 mg via INTRAVENOUS

## 2016-03-12 MED ORDER — FENTANYL CITRATE (PF) 100 MCG/2ML IJ SOLN
INTRAMUSCULAR | Status: AC
  Filled 2016-03-12: qty 2

## 2016-03-12 MED ORDER — PROPOFOL 10 MG/ML IV BOLUS
INTRAVENOUS | Status: AC
  Filled 2016-03-12: qty 20

## 2016-03-12 MED ORDER — LACTATED RINGERS IV SOLN
INTRAVENOUS | Status: DC
  Administered 2016-03-12: 10:00:00 via INTRAVENOUS

## 2016-03-12 MED ORDER — MIDAZOLAM HCL 2 MG/2ML IJ SOLN
INTRAMUSCULAR | Status: AC
  Filled 2016-03-12: qty 2

## 2016-03-12 MED ORDER — ONDANSETRON HCL 4 MG/2ML IJ SOLN
INTRAMUSCULAR | Status: AC
  Filled 2016-03-12: qty 2

## 2016-03-12 MED ORDER — PROPOFOL 500 MG/50ML IV EMUL
INTRAVENOUS | Status: DC | PRN
  Administered 2016-03-12 (×2): 50 mg via INTRAVENOUS

## 2016-03-12 MED ORDER — LACTATED RINGERS IV SOLN: INTRAVENOUS | Status: DC

## 2016-03-12 MED ORDER — LIDOCAINE HCL (CARDIAC) 20 MG/ML IV SOLN
INTRAVENOUS | Status: DC | PRN
  Administered 2016-03-12: 80 mg via INTRAVENOUS

## 2016-03-12 SURGICAL SUPPLY — 9 items
CLOTH BEACON ORANGE TIMEOUT ST (SAFETY) ×3 IMPLANT
GAUZE SPONGE 4X4 16PLY XRAY LF (GAUZE/BANDAGES/DRESSINGS) ×3 IMPLANT
GLOVE BIO SURGEON STRL SZ7 (GLOVE) ×3 IMPLANT
GLOVE BIOGEL PI IND STRL 7.0 (GLOVE) ×2 IMPLANT
GLOVE BIOGEL PI INDICATOR 7.0 (GLOVE) ×4
GOWN STRL REUS W/TWL LRG LVL3 (GOWN DISPOSABLE) ×4 IMPLANT
GOWN STRL REUS W/TWL XL LVL3 (GOWN DISPOSABLE) ×3 IMPLANT
SCOPETTES 8  STERILE (MISCELLANEOUS)
SCOPETTES 8 STERILE (MISCELLANEOUS) IMPLANT

## 2016-03-12 NOTE — Anesthesia Postprocedure Evaluation (Signed)
Anesthesia Post Note  Patient: Lori Miles  Procedure(s) Performed: Procedure(s) (LRB): EXAM UNDER ANESTHESIA, PAP with Breat Exam (N/A)  Patient location during evaluation: PACU Anesthesia Type: MAC Level of consciousness: awake and alert Pain management: pain level controlled Vital Signs Assessment: post-procedure vital signs reviewed and stable Respiratory status: spontaneous breathing, nonlabored ventilation, respiratory function stable and patient connected to nasal cannula oxygen Cardiovascular status: stable and blood pressure returned to baseline Anesthetic complications: no        Last Vitals:  Vitals:   03/12/16 1345 03/12/16 1400  BP: 108/71 106/72  Pulse: 95 92  Resp: (!) 25 20  Temp:      Last Pain:  Vitals:   03/12/16 0946  TempSrc: Oral   Pain Goal: Patients Stated Pain Goal: 3 (03/12/16 0946)               Kennieth RadFitzgerald, Melitta Tigue E

## 2016-03-12 NOTE — H&P (Signed)
Preoperative History and Physical  Lori Miles is a 20 y.o. No obstetric history on file. here for surgical management of autism; inability to tolerate awake pelvic exam.   Proposed surgery: Exam under anesthesia with PAP  Past Medical History:  Diagnosis Date  . Allergy   . Asthma   . Autism    mom states no actual dx, but admits she has some symptoms  . Bronchitis   . Constipation   . Frequency-urgency syndrome     some incontinence ; wears pull-ups  . GERD (gastroesophageal reflux disease)   . Interstitial cystitis   . Mental deficiency    hx of aggressive behavior,impaired speech   . Pneumonia   . PONV (postoperative nausea and vomiting)   . Seizure (HCC)    most recent sz " at the end of summer"-last sz years ago per mother  . Sleep apnea    needing to order CPAP   Past Surgical History:  Procedure Laterality Date  . CYSTOSCOPY    . CYSTOSCOPY WITH HYDRODISTENSION AND BIOPSY  04/14/2012   Procedure: CYSTOSCOPY/BIOPSY/HYDRODISTENSION;  Surgeon: Lindaann Slough, MD;  [facility];  Service: Urology;;  instillation of marcaine and pyridium  . TONSILLECTOMY     OB History    No data available     Patient denies any cervical dysplasia or STIs. Prescriptions Prior to Admission  Medication Sig Dispense Refill Last Dose  . albuterol (PROVENTIL) (2.5 MG/3ML) 0.083% nebulizer solution inhale contents of 1 vial in nebulizer every 4 to 6 hours if needed for cough or wheezing  1 03/12/2016 at 0600  . benzonatate (TESSALON) 100 MG capsule Take 2 capsules (200 mg total) by mouth 2 (two) times daily as needed for cough. 20 capsule 0 Past Week at Unknown time  . budesonide (PULMICORT) 0.5 MG/2ML nebulizer solution Take 2 mLs (0.5 mg total) by nebulization 2 (two) times daily. One vial twice daily 180 mL 4 03/12/2016 at 0600  . cetirizine (ZYRTEC) 10 MG tablet Take one tablet daily for runny nose or itching. 30 tablet 5 03/11/2016 at Unknown time  .  cloNIDine (CATAPRES) 0.1 MG tablet Take 1+1/2  tab by mouth at bedtime. 45 tablet 5 03/11/2016 at Unknown time  . Cranberry 500 MG CAPS Take 500 mg by mouth daily. Reported on 09/07/2015   03/11/2016 at Unknown time  . DEPAKOTE 500 MG DR tablet Take 1 tablet twice per day (Patient taking differently: Take 1 tablet twice per day  (BRAND NAME)) 60 tablet 5 03/12/2016 at 0600  . hyoscyamine (LEVSIN SL) 0.125 MG SL tablet DISSOLVE 1 TABLET UNDER THE TONGUE 2 TIMES A DAY  0 03/12/2016 at 0600  . Levonorgestrel-Ethinyl Estradiol (SEASONIQUE) 0.15-0.03 &0.01 MG tablet Take 1 tablet by mouth daily. Take 1 active tablet daily, skip non active pills. 1 Package 11 03/12/2016 at 0600  . Melatonin 3 MG TABS Take 3 mg by mouth at bedtime. For insomnia   03/11/2016 at Unknown time  . montelukast (SINGULAIR) 10 MG tablet Take one tablet each evening to prevent cough or wheeze. 30 tablet 5 03/11/2016 at Unknown time  . ondansetron (ZOFRAN ODT) 8 MG disintegrating tablet Take 1 tablet (8 mg total) by mouth every 8 (eight) hours as needed for nausea or vomiting. 10 tablet 0 03/12/2016 at 0600  . ondansetron (ZOFRAN) 4 MG tablet Take 1 tablet (4 mg total) by mouth every 6 (six) hours. 12 tablet 0 03/12/2016 at 0600  . polyethylene glycol powder (GLYCOLAX/MIRALAX) powder  03/11/2016 at Unknown time  . ranitidine (ZANTAC) 150 MG tablet Twice daily, morning and evening. 60 tablet 11 03/12/2016 at 0600  . senna (SENOKOT) 8.6 MG tablet Take 1 tablet by mouth daily.   03/11/2016 at Unknown time  . torsemide (DEMADEX) 10 MG tablet Take 15 mg by mouth daily.    03/12/2016 at 0600  . traZODone (DESYREL) 50 MG tablet Take 2 tabs by mouth at bedtime 62 tablet 5 03/11/2016 at Unknown time  . vitamin C (ASCORBIC ACID) 500 MG tablet Take 500 mg by mouth daily. Reported on 09/07/2015   03/11/2016 at Unknown time  . azithromycin (ZITHROMAX) 250 MG tablet Take 1 tablet (250 mg total) by mouth daily. Take first 2 tablets together, then 1  every day until finished. 6 tablet 0 Taking  . nitrofurantoin (MACRODANTIN) 100 MG capsule    More than a month at Unknown time  . predniSONE (DELTASONE) 10 MG tablet Take 2 tablets (20 mg total) by mouth daily. 16 tablet 0 Taking    Allergies  Allergen Reactions  . Abilify [Aripiprazole] Swelling and Palpitations  . Seroquel [Quetiapine Fumarate] Palpitations  . Doxycycline Other (See Comments)    Stomach cramps  . Amoxicillin-Pot Clavulanate Hives  . Keppra [Levetiracetam] Other (See Comments)    Aggressive behavior    Social History:   reports that she has never smoked. She has never used smokeless tobacco. She reports that she does not drink alcohol or use drugs. Family History  Problem Relation Age of Onset  . Seizures Mother   . Seizures Sister     1 sister has  . Pancreatic cancer Sister   . Colon cancer Neg Hx   . Colon polyps Neg Hx   . Esophageal cancer Neg Hx   . Rectal cancer Neg Hx   . Stomach cancer Neg Hx     Review of Systems: Noncontributory  PHYSICAL EXAM: Blood pressure 115/66, temperature 98.1 F (36.7 C), temperature source Oral, resp. rate (!) 22, SpO2 99 %. General appearance - alert, well appearing, and in no distress Chest - clear to auscultation, no wheezes, rales or rhonchi, symmetric air entry Heart - normal rate and regular rhythm Abdomen - soft, nontender, nondistended, no masses or organomegaly Pelvic - examination not indicated Extremities - peripheral pulses normal, no pedal edema, no clubbing or cyanosis  Labs: Results for orders placed or performed during the hospital encounter of 03/05/16 (from the past 336 hour(s))  CBC   Collection Time: 03/05/16  4:05 PM  Result Value Ref Range   WBC 13.4 (H) 4.0 - 10.5 K/uL   RBC 4.01 3.87 - 5.11 MIL/uL   Hemoglobin 12.2 12.0 - 15.0 g/dL   HCT 02.735.8 (L) 25.336.0 - 66.446.0 %   MCV 89.3 78.0 - 100.0 fL   MCH 30.4 26.0 - 34.0 pg   MCHC 34.1 30.0 - 36.0 g/dL   RDW 40.313.9 47.411.5 - 25.915.5 %   Platelets 339 150  - 400 K/uL  Basic metabolic panel   Collection Time: 03/05/16  4:05 PM  Result Value Ref Range   Sodium 136 135 - 145 mmol/L   Potassium 3.8 3.5 - 5.1 mmol/L   Chloride 102 101 - 111 mmol/L   CO2 24 22 - 32 mmol/L   Glucose, Bld 130 (H) 65 - 99 mg/dL   BUN 12 6 - 20 mg/dL   Creatinine, Ser 5.630.84 0.44 - 1.00 mg/dL   Calcium 8.7 (L) 8.9 - 10.3 mg/dL   GFR calc non  Af Amer >60 >60 mL/min   GFR calc Af Amer >60 >60 mL/min   Anion gap 10 5 - 15    Imaging Studies: Dg Chest 2 View  Result Date: 02/26/2016 CLINICAL DATA:  20 year old female with cough and congestion and fever EXAM: CHEST  2 VIEW COMPARISON:  Chest radiograph dated 02/07/2016 FINDINGS: There is shallow inspiration. No focal consolidation, pleural effusion, or pneumothorax. The cardiac silhouette is within normal limits. No acute osseous pathology identified. IMPRESSION: No active cardiopulmonary disease. Electronically Signed   By: Elgie CollardArash  Radparvar M.D.   On: 02/26/2016 04:00    Assessment: Patient Active Problem List   Diagnosis Date Noted  . Excessive daytime sleepiness 03/05/2016  . Insomnia 10/04/2015  . Acute bronchitis 05/05/2015  . Dyspnea 05/05/2015  . Gait disorder 03/22/2015  . 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 Cough variant asthma vs UACS/ vcd  03/15/2015  . CAP (community acquired pneumonia) 02/14/2015  . Autism spectrum disorder 02/14/2015  . Acute respiratory failure (HCC) 02/14/2015  . Nausea & vomiting 02/14/2015  . Disturbance in sleep behavior 12/13/2014  . Obesity, morbid (HCC) 12/13/2014  . 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 Acanthosis nigricans 11/14/2014  . Toeing-in 08/29/2014  . Obesity (BMI 30-39.9) 08/23/2013  . 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 Interstitial cystitis 04/28/2012  . Recurrent urinary tract infection 08/01/2011  . Asthma 08/01/2011  . Chronic constipation 08/01/2011  . Contraception 08/01/2011    Plan: Patient will undergo surgical management with Exam under anesthesia with PAP.   The risks of surgery were discussed in detail with the patient including but not limited to: bleeding which may require transfusion or reoperation; infection which may require antibiotics; injury to surrounding organs which may involve bowel, bladder, ureters ; need for additional procedures including laparoscopy or laparotomy; thromboembolic phenomenon, surgical site problems and other postoperative/anesthesia complications. Likelihood of success in alleviating the patient's condition was discussed. Routine postoperative instructions will be reviewed with the patient and her family in detail after surgery.  The patient concurred with the proposed plan, giving informed written consent for the surgery.  Patient has been NPO since last night she will remain NPO for procedure.  Anesthesia and OR aware.  Preoperative prophylactic antibiotics and SCDs ordered on call to the OR.  To OR when ready.  Waylin Dorko L. Erin FullingHarraway-Smith, M.D., [facility] 03/12/2016 11:33 AM

## 2016-03-12 NOTE — Telephone Encounter (Signed)
Pt mother called to office stating that her daughter had her pap done today at hospital and is now bleeding a lot. Pt had pap/exam under anesthesia today. Pt mom states that she has previously been on medication in the past when she bleeding. Reviewed with Dr Clearance CootsHarper, continue to take birth control and monitor bleeding. Pt mother made aware that if pt is bleeding and filling a pad an hour she [facility].  Mother advised she can give Ibuprofen and Tylenol for cramping.  Mother states understanding and will call office tomorrow if bleeding/pain is worse.

## 2016-03-12 NOTE — Transfer of Care (Signed)
Immediate Anesthesia Transfer of Care Note  Patient: Lori Miles  Procedure(s) Performed: Procedure(s): EXAM UNDER ANESTHESIA, PAP with Breat Exam (N/A)  Patient Location: PACU  Anesthesia Type:MAC  Level of Consciousness: awake, alert  and oriented  Airway & Oxygen Therapy: Patient Spontanous Breathing and Patient connected to nasal cannula oxygen  Post-op Assessment: Report given to RN, Post -op Vital signs reviewed and stable and Patient moving all extremities  Post vital signs: Reviewed and stable  Last Vitals:  Vitals:   03/12/16 0946  BP: 115/66  Resp: (!) 22  Temp: 36.7 C    Last Pain:  Vitals:   03/12/16 0946  TempSrc: Oral      Patients Stated Pain Goal: 3 (03/12/16 0946)  Complications: No apparent anesthesia complications

## 2016-03-12 NOTE — Addendum Note (Signed)
Addendum  created 03/12/16 1559 by Algis GreenhouseLinda A Adrain Butrick, CRNA   Charge Capture section accepted, Visit diagnoses modified

## 2016-03-12 NOTE — Op Note (Signed)
03/12/2016  1:47 PM  PATIENT:  Lori Miles  20 y.o. female  PRE-OPERATIVE DIAGNOSIS:  MENTAL RETARDATION  POST-OPERATIVE DIAGNOSIS:  MENTAL RETARDATION  PROCEDURE:  Procedure(s): EXAM UNDER ANESTHESIA, PAP with Breat Exam (N/A)  SURGEON:  Surgeon(s) and Role:    * Willodean Rosenthalarolyn Harraway-Smith, MD - Primary  ANESTHESIA:   MAC  EBL:  Total I/O In: 500 [I.V.:500] Out: 0   BLOOD ADMINISTERED:none  DRAINS: none   LOCAL MEDICATIONS USED:  NONE  SPECIMEN:  Source of Specimen:  cervix  DISPOSITION OF SPECIMEN:  PATHOLOGY  COUNTS:  YES  TOURNIQUET:  * No tourniquets in log *  DICTATION: .Note written in EPIC  PLAN OF CARE: Discharge to home after PACU  PATIENT DISPOSITION:  PACU - hemodynamically stable.   Delay start of Pharmacological VTE agent (>24hrs) due to surgical blood loss or risk of bleeding: not applicable  Complications: none  Procedure: pt was taken to the OR and was sedated with MAC.    Breast Exam:    No tenderness, masses, or nipple abnormality; axilla without masses.  Abdomen:     Soft, non-tender, bowel sounds active all four quadrants,    no masses, no organomegaly  Genitalia:    Normal female without lesion, discharge or tenderness; PAP of cervix obtained without difficulty       Pt was awakened from anesthesia and take to recovery.  There were no complications and the patient tolerated the procedure well.  I spoke to her mother and the patient needs a repeat exam in 5 years pending a normal PAP.  Keilyn Nadal L. Harraway-Smith, M.D., Evern CoreFACOG

## 2016-03-12 NOTE — Anesthesia Preprocedure Evaluation (Addendum)
Anesthesia Evaluation  Patient identified by MRN, date of birth, ID band Patient confused    Reviewed: Allergy & Precautions, NPO status , Patient's Chart, lab work & pertinent test results  Airway Mallampati: II  TM Distance: >3 FB     Dental   Pulmonary asthma , sleep apnea ,    breath sounds clear to auscultation       Cardiovascular negative cardio ROS   Rhythm:Regular Rate:Normal     Neuro/Psych Seizures -,  ?Autism. Mental retardation    GI/Hepatic Neg liver ROS, GERD  ,  Endo/Other  negative endocrine ROS  Renal/GU negative Renal ROS     Musculoskeletal negative musculoskeletal ROS (+)   Abdominal   Peds  Hematology negative hematology ROS (+)   Anesthesia Other Findings   Reproductive/Obstetrics                             Lab Results  Component Value Date   WBC 13.4 (H) 03/05/2016   HGB 12.2 03/05/2016   HCT 35.8 (L) 03/05/2016   MCV 89.3 03/05/2016   PLT 339 03/05/2016   Lab Results  Component Value Date   CREATININE 0.84 03/05/2016   BUN 12 03/05/2016   NA 136 03/05/2016   K 3.8 03/05/2016   CL 102 03/05/2016   CO2 24 03/05/2016    Anesthesia Physical Anesthesia Plan  ASA: III  Anesthesia Plan: MAC   Post-op Pain Management:    Induction: Intravenous  Airway Management Planned: Natural Airway and Simple Face Mask  Additional Equipment:   Intra-op Plan:   Post-operative Plan:   Informed Consent: I have reviewed the patients History and Physical, chart, labs and discussed the procedure including the risks, benefits and alternatives for the proposed anesthesia with the patient or authorized representative who has indicated his/her understanding and acceptance.   Consent reviewed with POA  Plan Discussed with: CRNA  Anesthesia Plan Comments:        Anesthesia Quick Evaluation

## 2016-03-12 NOTE — Brief Op Note (Signed)
03/12/2016  1:47 PM  PATIENT:  Lori Miles  20 y.o. female  PRE-OPERATIVE DIAGNOSIS:  MENTAL RETARDATION  POST-OPERATIVE DIAGNOSIS:  MENTAL RETARDATION  PROCEDURE:  Procedure(s): EXAM UNDER ANESTHESIA, PAP with Breat Exam (N/A)  SURGEON:  Surgeon(s) and Role:    * Willodean Rosenthalarolyn Harraway-Smith, MD - Primary  ANESTHESIA:   MAC  EBL:  Total I/O In: 500 [I.V.:500] Out: 0   BLOOD ADMINISTERED:none  DRAINS: none   LOCAL MEDICATIONS USED:  NONE  SPECIMEN:  Source of Specimen:  cervix  DISPOSITION OF SPECIMEN:  PATHOLOGY  COUNTS:  YES  TOURNIQUET:  * No tourniquets in log *  DICTATION: .Note written in EPIC  PLAN OF CARE: Discharge to home after PACU  PATIENT DISPOSITION:  PACU - hemodynamically stable.   Delay start of Pharmacological VTE agent (>24hrs) due to surgical blood loss or risk of bleeding: not applicable  Complications: none  Lucky Alverson L. Harraway-Smith, M.D., Evern CoreFACOG

## 2016-03-13 LAB — CYTOLOGY - PAP: Diagnosis: NEGATIVE

## 2016-03-27 ENCOUNTER — Ambulatory Visit (HOSPITAL_BASED_OUTPATIENT_CLINIC_OR_DEPARTMENT_OTHER): Payer: Medicare Other | Attending: Family | Admitting: Internal Medicine

## 2016-03-27 DIAGNOSIS — G4719 Other hypersomnia: Secondary | ICD-10-CM

## 2016-03-27 DIAGNOSIS — R0683 Snoring: Secondary | ICD-10-CM | POA: Insufficient documentation

## 2016-03-27 DIAGNOSIS — G478 Other sleep disorders: Secondary | ICD-10-CM

## 2016-03-27 DIAGNOSIS — R400 Somnolence: Secondary | ICD-10-CM | POA: Insufficient documentation

## 2016-03-28 ENCOUNTER — Telehealth (INDEPENDENT_AMBULATORY_CARE_PROVIDER_SITE_OTHER): Payer: Self-pay

## 2016-03-28 NOTE — Telephone Encounter (Signed)
I called and talked to Mom. She said that Lori Miles had the sleep study last night and that she had loud snoring and that she was told that she would benefit from CPAP. I told Mom that we would call her when we received the results. Mom agreed with this plan. TG

## 2016-03-28 NOTE — Telephone Encounter (Signed)
[name], mom, lvm requesting a cb from [facility] regarding sleep study. She said that the study that was performed was not the correct one. CB# [phone]

## 2016-03-31 DIAGNOSIS — G478 Other sleep disorders: Secondary | ICD-10-CM | POA: Diagnosis not present

## 2016-03-31 NOTE — Procedures (Signed)
  Patient Name: Lori Miles, Ted Study Date: 03/27/2016 Gender: Female D.O.B: 1996-01-24 Age (years): 20 Referring Provider: Elveria Risingina Goodpasture NP Height (inches): 60 Interpreting Physician: Jetty Duhamellinton Young MD, ABSM Weight (lbs): 185 RPSGT: [name], [name] BMI: 37 MRN: [id number] Neck Size: 15.00 CLINICAL INFORMATION Sleep Study Type: NPSG  Indication for sleep study: Excessive Daytime Sleepiness  Epworth Sleepiness Score: 24   Most recent polysomnogram dated 01/06/2015 revealed an AHI of 0.4/h and RDI of 0.4/h. SLEEP STUDY TECHNIQUE As per the AASM Manual for the Scoring of Sleep and Associated Events v2.3 (April 2016) with a hypopnea requiring 4% desaturations.  The channels recorded and monitored were frontal, central and occipital EEG, electrooculogram (EOG), submentalis EMG (chin), nasal and oral airflow, thoracic and abdominal wall motion, anterior tibialis EMG, snore microphone, electrocardiogram, and pulse oximetry.  MEDICATIONS Medications self-administered by patient taken the night of the study : none reported  SLEEP ARCHITECTURE The study was initiated at 8:56:24 PM and ended at 4:47:46 AM.  Sleep onset time was 0.3 minutes and the sleep efficiency was 99.8%. The total sleep time was 470.5 minutes.  Stage REM latency was 62.0 minutes.  The patient spent 0.21% of the night in stage N1 sleep, 48.67% in stage N2 sleep, 12.96% in stage N3 and 38.15% in REM.  Alpha intrusion was absent.  Supine sleep was 100.00%.  RESPIRATORY PARAMETERS The overall apnea/hypopnea index (AHI) was 0.1 per hour. There were 0 total apneas, including 0 obstructive, 0 central and 0 mixed apneas. There were 1 hypopneas and 8 RERAs.  The AHI during Stage REM sleep was 0.0 per hour.  AHI while supine was 0.1 per hour.  The mean oxygen saturation was 95.62%. The minimum SpO2 during sleep was 90.00%.  Moderate snoring was noted during this study.  CARDIAC DATA The 2 lead EKG  demonstrated sinus rhythm. The mean heart rate was 94.69 beats per minute. Other EKG findings include: None.  LEG MOVEMENT DATA The total PLMS were 4 with a resulting PLMS index of 0.51. Associated arousal with leg movement index was 0.0 .  IMPRESSIONS - No significant obstructive sleep apnea occurred during this study (AHI = 0.1/h). - No significant central sleep apnea occurred during this study (CAI = 0.0/h). - The patient had minimal or no oxygen desaturation during the study (Min O2 = 90.00%) - The patient snored with Moderate snoring volume. - No cardiac abnormalities were noted during this study. - Clinically significant periodic limb movements did not occur during sleep. No significant associated arousals. - Review of clinical referral records suggests gastroesophageal reflux may best explain nocturnal choking episodes.  DIAGNOSIS - Primary Snoring (786.09 [R06.83 ICD-10])  RECOMMENDATIONS - Consider evaluating for esophageal reflux  - Avoid alcohol, sedatives and other CNS depressants that may worsen sleep apnea and disrupt normal sleep architecture. - Sleep hygiene should be reviewed to assess factors that may improve sleep quality. - Weight management and regular exercise should be initiated or continued if appropriate.  [Electronically signed] 03/31/2016 04:12 PM  Jetty Duhamellinton Young MD, ABSM Diplomate, American Board of Sleep Medicine   NPI: [id number]  Waymon BudgeYOUNG,CLINTON D Diplomate, American Board of Sleep Medicine  ELECTRONICALLY SIGNED ON:  03/31/2016, 4:09 PM [facility] PH: [phone]   FX: [phone] ACCREDITED BY THE AMERICAN ACADEMY OF SLEEP MEDICINE

## 2016-04-01 ENCOUNTER — Telehealth (INDEPENDENT_AMBULATORY_CARE_PROVIDER_SITE_OTHER): Payer: Self-pay | Admitting: Pediatrics

## 2016-04-01 NOTE — Telephone Encounter (Signed)
I recounted the results to mother who still believes inaccurately that the child has apnea and that the "wrong test" was done.  That is not the case.  She says that she talked with [name] about this and I will discuss this with [name].  We got the patient had snoring, but it had no effect on arousal nor did limb movements which were infrequent.  There appears to be no sleep disorder at this point.  Coughing at nighttime could represent gastroesophageal reflux.  This will have to be evaluated by her primary physician.

## 2016-04-01 NOTE — Telephone Encounter (Signed)
"  Lori Miles", mom, lvm stating that [name] is on medication for "gas" and does not believe that is causing the apnea. She is requesting a cb from [facility]. CB# [phone]

## 2016-04-02 NOTE — Telephone Encounter (Signed)
[facility]. [facility] said that the technician that did the sleep test recommended another test and that she wrote it down but didn't have it with her. [name] said that she will find it and call me back. [name] insists that [facility] doesn't have reflux because she is on reflux medicine. I told [name] that if she found the name of the other sleep test that she could let me know and that I would investigate it. TG

## 2016-04-08 DIAGNOSIS — J302 Other seasonal allergic rhinitis: Secondary | ICD-10-CM | POA: Diagnosis not present

## 2016-04-08 DIAGNOSIS — J4520 Mild intermittent asthma, uncomplicated: Secondary | ICD-10-CM | POA: Diagnosis not present

## 2016-04-08 DIAGNOSIS — R569 Unspecified convulsions: Secondary | ICD-10-CM | POA: Diagnosis not present

## 2016-04-12 NOTE — Telephone Encounter (Signed)
[name], mom, lvm stating that she found the "paper"with the test name on it. She said that she was going to talk to [facility]  Care Coordinator as well. CB# [phone]  I called [name] back and she said the name of the test is the CPAP Titration test. I let mom know that [name] was out of the office and would return next week. I offered to send a note to Dr. Sharene SkeansHickling, but mom declined. She would like [name] to call her back when she returns to the office. CB# [phone]

## 2016-04-22 DIAGNOSIS — R350 Frequency of micturition: Secondary | ICD-10-CM | POA: Diagnosis not present

## 2016-04-23 ENCOUNTER — Ambulatory Visit (INDEPENDENT_AMBULATORY_CARE_PROVIDER_SITE_OTHER): Payer: Medicare Other | Admitting: Allergy and Immunology

## 2016-04-23 ENCOUNTER — Encounter: Payer: Self-pay | Admitting: Allergy and Immunology

## 2016-04-23 ENCOUNTER — Encounter (INDEPENDENT_AMBULATORY_CARE_PROVIDER_SITE_OTHER): Payer: Self-pay

## 2016-04-23 VITALS — BP 92/70 | HR 100 | Resp 22 | Ht 59.0 in | Wt 189.2 lb

## 2016-04-23 DIAGNOSIS — J4540 Moderate persistent asthma, uncomplicated: Secondary | ICD-10-CM

## 2016-04-23 DIAGNOSIS — K219 Gastro-esophageal reflux disease without esophagitis: Secondary | ICD-10-CM

## 2016-04-23 DIAGNOSIS — J3089 Other allergic rhinitis: Secondary | ICD-10-CM | POA: Diagnosis not present

## 2016-04-23 MED ORDER — FORMOTEROL FUMARATE 20 MCG/2ML IN NEBU: 20.0000 ug | INHALATION_SOLUTION | Freq: Two times a day (BID) | RESPIRATORY_TRACT | 5 refills | Status: DC

## 2016-04-23 MED ORDER — ALBUTEROL SULFATE (2.5 MG/3ML) 0.083% IN NEBU: INHALATION_SOLUTION | RESPIRATORY_TRACT | 1 refills | Status: DC

## 2016-04-23 NOTE — Patient Instructions (Addendum)
  1. Every day use a combination of the following:   A. budesonide 0.5 mg nebulized twice a day  B. Perforomist nebulized twice a day  2. Every day use a combination of the following:   A. Nasonex one spray each nostril twice a day  B. Patanase 1 spray each nostril twice a day   3. Continue montelukast 10 mg daily  4. Continue cetirizine 10 mg daily  5. Continue ranitidine 150 mg twice a day  6. Continue albuterol nebulization every 4-6 hours if needed  7.  Return to clinic in 4 weeks or earlier if problem

## 2016-04-23 NOTE — Progress Notes (Signed)
Follow-up Note  Referring Provider: Fleet Contras, MD Primary Provider: Dorrene German, MD Date of Office Visit: 04/23/2016  Subjective:   Lori Miles (DOB: [birth year]) is a 21 y.o. female [facility] in re-evaluation of the following:  HPI: Lori Miles presents to this clinic in evaluation of respiratory tract problems. I have not seen her in this clinic in many years. It is somewhat confusing story as her mom describes events from over 6 years ago and last week and sometimes those events get confused.  As best as I can tell she has a history of asthma requiring her to use nebulized bronchodilator because she cannot coordinate an inhaled steroid and she must use a short-acting bronchodilator on a pretty frequent basis recently using it daily. She can't really exercise otherwise she precipitates wheezing and coughing.  As well, she's been to the hospital "every weekend" with "sinus". Her sinus problem is manifested as nasal congestion for the most part without recurrent fevers or ugly nasal discharge. It is hard to tell whether or not she's being treated with antibiotics.  She has issues with vomiting. Apparently she's been treated with therapy directed against reflux but still occasionally develops some issues with posttussive emesis.  Things get quite confusing when working through the issue of possible sleep apnea. According to her mom she has obstructive events at nighttime and completely stops breathing. She's apparently had 2 sleep studies, 1 which identified sleep apnea and the other which did not. She has seen Dr. Annalee Genta who feels as though she would benefit from a CPAP machine.  She has occasionally had some epistaxis.  Allergies as of 04/23/2016      Reactions   Abilify [aripiprazole] Swelling, Palpitations   Seroquel [quetiapine Fumarate] Palpitations   Doxycycline Other (See Comments)   Stomach cramps   Amoxicillin-pot  Clavulanate Hives   Keppra [levetiracetam] Other (See Comments)   Aggressive behavior       Medication List      albuterol (2.5 MG/3ML) 0.083% nebulizer solution Commonly known as:  PROVENTIL inhale contents of 1 vial in nebulizer every 4 to 6 hours if needed for cough or wheezing   benzonatate 100 MG capsule Commonly known as:  TESSALON Take 2 capsules (200 mg total) by mouth 2 (two) times daily as needed for cough.   budesonide 0.5 MG/2ML nebulizer solution Commonly known as:  PULMICORT Take 2 mLs (0.5 mg total) by nebulization 2 (two) times daily. One vial twice daily   cetirizine 10 MG tablet Commonly known as:  ZYRTEC Take one tablet daily for runny nose or itching.   cloNIDine 0.1 MG tablet Commonly known as:  CATAPRES Take 1+1/2  tab by mouth at bedtime.   Cranberry 500 MG Caps Take 500 mg by mouth daily. Reported on 09/07/2015   DEPAKOTE 500 MG DR tablet Generic drug:  divalproex Take 1 tablet twice per day   hyoscyamine 0.125 MG SL tablet Commonly known as:  LEVSIN SL DISSOLVE 1 TABLET UNDER THE TONGUE 2 TIMES A DAY   Levonorgestrel-Ethinyl Estradiol 0.15-0.03 &0.01 MG tablet Commonly known as:  SEASONIQUE Take 1 tablet by mouth daily. Take 1 active tablet daily, skip non active pills.   Melatonin 3 MG Tabs Take 3 mg by mouth at bedtime. For insomnia   montelukast 10 MG tablet Commonly known as:  SINGULAIR Take one tablet each evening to prevent cough or wheeze.   nitrofurantoin 100 MG capsule Commonly known as:  MACRODANTIN   ondansetron 4 MG tablet Commonly known as:  ZOFRAN Take 1 tablet (4 mg total) by mouth every 6 (six) hours.   polyethylene glycol powder powder Commonly known as:  GLYCOLAX/MIRALAX   ranitidine 150 MG tablet Commonly known as:  ZANTAC Twice daily, morning and evening.   senna 8.6 MG tablet Commonly known as:  SENOKOT Take 1 tablet by mouth daily.   torsemide 10 MG tablet Commonly known as:  DEMADEX Take 15 mg by  mouth daily.   traZODone 50 MG tablet Commonly known as:  DESYREL Take 2 tabs by mouth at bedtime       Past Medical History:  Diagnosis Date  . Allergy   . Asthma   . Autism    mom states no actual dx, but admits she has some symptoms  . Bronchitis   . Constipation   . Frequency-urgency syndrome     some incontinence ; wears pull-ups  . GERD (gastroesophageal reflux disease)   . Interstitial cystitis   . Mental deficiency    hx of aggressive behavior,impaired speech   . Pneumonia   . PONV (postoperative nausea and vomiting)   . Seizure (HCC)    most recent sz " at the end of summer"-last sz years ago per mother  . Sleep apnea    needing to order CPAP    Past Surgical History:  Procedure Laterality Date  . CYSTOSCOPY    . CYSTOSCOPY WITH HYDRODISTENSION AND BIOPSY  04/14/2012   Procedure: CYSTOSCOPY/BIOPSY/HYDRODISTENSION;  Surgeon: Lindaann Slough, MD;  Location: [facility];  Service: Urology;;  instillation of marcaine and pyridium  . TONSILLECTOMY      Review of systems negative except as noted in HPI / PMHx or noted below:  Review of Systems  Constitutional: Negative.   HENT: Negative.   Eyes: Negative.   Respiratory: Negative.   Cardiovascular: Negative.   Gastrointestinal: Negative.   Genitourinary: Negative.   Musculoskeletal: Negative.   Skin: Negative.   Neurological: Negative.   Endo/Heme/Allergies: Negative.   Psychiatric/Behavioral: Negative.      Objective:   Vitals:   04/23/16 1700  BP: 92/70  Pulse: 100  Resp: (!) 22   Height: 4\' 11"  (149.9 cm)  Weight: 189 lb 3.2 oz (85.8 kg)   Physical Exam  Constitutional: She is well-developed, well-nourished, and in no distress.  HENT:  Head: Normocephalic.  Right Ear: Tympanic membrane, external ear and ear canal normal.  Left Ear: Tympanic membrane, external ear and ear canal normal.  Nose: Mucosal edema present. No rhinorrhea.  Mouth/Throat: Uvula is midline, oropharynx  is clear and moist and mucous membranes are normal. No oropharyngeal exudate.  Eyes: Conjunctivae are normal.  Neck: Trachea normal. No tracheal tenderness present. No tracheal deviation present. No thyromegaly present.  Cardiovascular: Normal rate, regular rhythm, S1 normal, S2 normal and normal heart sounds.   No murmur heard. Pulmonary/Chest: Breath sounds normal. No stridor. No respiratory distress. She has no wheezes. She has no rales.  Musculoskeletal: She exhibits no edema.  Lymphadenopathy:       Head (right side): No tonsillar adenopathy present.       Head (left side): No tonsillar adenopathy present.    She has no cervical adenopathy.  Neurological: She is alert. Gait normal.  Skin: No rash noted. She is not diaphoretic. No erythema. Nails show no clubbing.  Psychiatric: Mood and affect normal.    Diagnostics: Spirometry was performed. She had an FEV1 of 1.85 which was 77% of  predicted  Assessment and Plan:   1. Asthma, moderate persistent, well-controlled   2. Other allergic rhinitis   3. Gastroesophageal reflux disease, esophagitis presence not specified     1. Every day use a combination of the following:   A. budesonide 0.5 mg nebulized twice a day  B. Perforomist nebulized twice a day  2. Every day use a combination of the following:   A. Nasonex one spray each nostril twice a day  B. Patanase 1 spray each nostril twice a day   3. Continue montelukast 10 mg daily  4. Continue cetirizine 10 mg daily  5. Continue ranitidine 150 mg twice a day  6. Continue albuterol nebulization every 4-6 hours if needed  7.  Return to clinic in 4 weeks or earlier if problem  [name] will use a combination of anti-inflammatory medications for both her upper and lower airways and we'll see what kind of response we get over the course of the next 4 weeks or so. She will also continue to use her therapy directed against reflux. Obviously we need to work through the issue of her  possible sleep apnea and I'll collect all the studies that have been performed in the past and also review Dr. Thurmon FairShoemaker's notes and make a decision about how to proceed regarding that issue. I'll see her back in this clinic in 4 weeks or earlier if there is a problem.  Laurette SchimkeEric Collette Pescador, MD Allergy / Immunology [facility]

## 2016-04-25 ENCOUNTER — Other Ambulatory Visit: Payer: Self-pay | Admitting: *Deleted

## 2016-04-25 MED ORDER — FORMOTEROL FUMARATE 20 MCG/2ML IN NEBU: 20.0000 ug | INHALATION_SOLUTION | Freq: Two times a day (BID) | RESPIRATORY_TRACT | 5 refills | Status: DC

## 2016-04-29 ENCOUNTER — Telehealth: Payer: Self-pay | Admitting: *Deleted

## 2016-04-29 ENCOUNTER — Other Ambulatory Visit: Payer: Self-pay | Admitting: Pediatrics

## 2016-04-29 DIAGNOSIS — G40209 Localization-related (focal) (partial) symptomatic epilepsy and epileptic syndromes with complex partial seizures, not intractable, without status epilepticus: Secondary | ICD-10-CM

## 2016-04-29 DIAGNOSIS — G4700 Insomnia, unspecified: Secondary | ICD-10-CM

## 2016-04-29 DIAGNOSIS — G40309 Generalized idiopathic epilepsy and epileptic syndromes, not intractable, without status epilepticus: Secondary | ICD-10-CM

## 2016-04-29 NOTE — Telephone Encounter (Signed)
Can we use [facility]?

## 2016-04-29 NOTE — Telephone Encounter (Signed)
Perforomist is denied. Please advise.

## 2016-04-30 NOTE — Telephone Encounter (Signed)
Will make sure pharmacy is running medication under Part B.

## 2016-05-02 NOTE — Telephone Encounter (Signed)
Perforomist was ran through Part B and patient has already picked [facility] script per [facility].

## 2016-05-13 DIAGNOSIS — R569 Unspecified convulsions: Secondary | ICD-10-CM | POA: Diagnosis not present

## 2016-05-13 DIAGNOSIS — F840 Autistic disorder: Secondary | ICD-10-CM | POA: Diagnosis not present

## 2016-05-13 DIAGNOSIS — J4520 Mild intermittent asthma, uncomplicated: Secondary | ICD-10-CM | POA: Diagnosis not present

## 2016-05-21 ENCOUNTER — Ambulatory Visit (INDEPENDENT_AMBULATORY_CARE_PROVIDER_SITE_OTHER): Payer: Medicare Other | Admitting: Allergy and Immunology

## 2016-05-21 VITALS — BP 102/62 | HR 108 | Temp 98.4°F | Resp 16 | Ht <= 58 in | Wt 182.0 lb

## 2016-05-21 DIAGNOSIS — J3089 Other allergic rhinitis: Secondary | ICD-10-CM

## 2016-05-21 DIAGNOSIS — J4540 Moderate persistent asthma, uncomplicated: Secondary | ICD-10-CM

## 2016-05-21 DIAGNOSIS — K219 Gastro-esophageal reflux disease without esophagitis: Secondary | ICD-10-CM | POA: Diagnosis not present

## 2016-05-21 NOTE — Progress Notes (Signed)
Follow-up Note  Referring Provider: Fleet Contras, MD Primary Provider: Dorrene German, MD Date of Office Visit: 05/21/2016  Subjective:   Lori Miles (DOB: 1995/10/14) is a 21 y.o. female [facility] in re-evaluation of the following:  HPI: Lori Miles returns to this clinic in reevaluation of her respiratory tract issues addressed 04/23/2016 which included administration of anti-inflammatory medications for her respiratory tract and therapy directed against reflux.  She has improved tremendously. She has no problems with coughing. She does not need to use a nebulized bronchodilator. She has no problems with her nose. Her nose is much more open. Her sleeping is a lot better. She does not have obstruction at nighttime. She has no issues with vomiting.  Allergies as of 05/21/2016      Reactions   Abilify [aripiprazole] Swelling, Palpitations   Seroquel [quetiapine Fumarate] Palpitations   Doxycycline Other (See Comments)   Stomach cramps   Amoxicillin-pot Clavulanate Hives   Keppra [levetiracetam] Other (See Comments)   Aggressive behavior       Medication List      albuterol (2.5 MG/3ML) 0.083% nebulizer solution Commonly known as:  PROVENTIL Inhale the contents of one vial in nebulizer every four to six hours as needed for cough or wheeze.   benzonatate 100 MG capsule Commonly known as:  TESSALON Take 2 capsules (200 mg total) by mouth 2 (two) times daily as needed for cough.   budesonide 0.5 MG/2ML nebulizer solution Commonly known as:  PULMICORT Take 2 mLs (0.5 mg total) by nebulization 2 (two) times daily. One vial twice daily   cetirizine 10 MG tablet Commonly known as:  ZYRTEC Take one tablet daily for runny nose or itching.   cloNIDine 0.1 MG tablet Commonly known as:  CATAPRES Take 1+1/2  tab by mouth at bedtime.   Cranberry 500 MG Caps Take 500 mg by mouth daily. Reported on 09/07/2015   DEPAKOTE 500 MG  DR tablet Generic drug:  divalproex take 1 tablet by mouth twice a day   formoterol 20 MCG/2ML nebulizer solution Commonly known as:  PERFOROMIST Take 2 mLs (20 mcg total) by nebulization 2 (two) times daily.   hyoscyamine 0.125 MG SL tablet Commonly known as:  LEVSIN SL DISSOLVE 1 TABLET UNDER THE TONGUE 2 TIMES A DAY   Levonorgestrel-Ethinyl Estradiol 0.15-0.03 &0.01 MG tablet Commonly known as:  SEASONIQUE Take 1 tablet by mouth daily. Take 1 active tablet daily, skip non active pills.   Melatonin 3 MG Tabs Take 3 mg by mouth at bedtime. For insomnia   montelukast 10 MG tablet Commonly known as:  SINGULAIR Take one tablet each evening to prevent cough or wheeze.   nitrofurantoin 100 MG capsule Commonly known as:  MACRODANTIN   ondansetron 4 MG tablet Commonly known as:  ZOFRAN Take 1 tablet (4 mg total) by mouth every 6 (six) hours.   polyethylene glycol powder powder Commonly known as:  GLYCOLAX/MIRALAX   ranitidine 150 MG tablet Commonly known as:  ZANTAC Twice daily, morning and evening.   senna 8.6 MG tablet Commonly known as:  SENOKOT Take 1 tablet by mouth daily.   torsemide 10 MG tablet Commonly known as:  DEMADEX Take 15 mg by mouth daily.   traZODone 50 MG tablet Commonly known as:  DESYREL take 2 tablets by mouth at bedtime       Past Medical History:  Diagnosis Date  . Allergy   . Asthma   .  Autism    mom states no actual dx, but admits she has some symptoms  . Bronchitis   . Constipation   . Frequency-urgency syndrome     some incontinence ; wears pull-ups  . GERD (gastroesophageal reflux disease)   . Interstitial cystitis   . Mental deficiency    hx of aggressive behavior,impaired speech   . Pneumonia   . PONV (postoperative nausea and vomiting)   . Seizure (HCC)    most recent sz " at the end of summer"-last sz years ago per mother  . Sleep apnea    needing to order CPAP    Past Surgical History:  Procedure Laterality Date    . CYSTOSCOPY    . CYSTOSCOPY WITH HYDRODISTENSION AND BIOPSY  04/14/2012   Procedure: CYSTOSCOPY/BIOPSY/HYDRODISTENSION;  Surgeon: Lindaann SloughMarc-Henry Nesi, MD;  [facility];  Service: Urology;;  instillation of marcaine and pyridium  . TONSILLECTOMY      Review of systems negative except as noted in HPI / PMHx or noted below:  Review of Systems  Constitutional: Negative.   HENT: Negative.   Eyes: Negative.   Respiratory: Negative.   Cardiovascular: Negative.   Gastrointestinal: Negative.   Genitourinary: Negative.   Musculoskeletal: Negative.   Skin: Negative.   Neurological: Negative.   Endo/Heme/Allergies: Negative.   Psychiatric/Behavioral: Negative.      Objective:   Vitals:   05/21/16 1410  BP: 102/62  Pulse: (!) 108  Resp: 16  Temp: 98.4 F (36.9 C)   Height: 4\' 9"  (144.8 cm)  Weight: 182 lb (82.6 kg)   Physical Exam  Constitutional: She is well-developed, well-nourished, and in no distress.  HENT:  Head: Normocephalic.  Right Ear: Tympanic membrane, external ear and ear canal normal.  Left Ear: Tympanic membrane, external ear and ear canal normal.  Nose: Nose normal. No mucosal edema or rhinorrhea.  Mouth/Throat: Uvula is midline, oropharynx is clear and moist and mucous membranes are normal. No oropharyngeal exudate.  Eyes: Conjunctivae are normal.  Neck: Trachea normal. No tracheal tenderness present. No tracheal deviation present. No thyromegaly present.  Cardiovascular: Normal rate, regular rhythm, S1 normal, S2 normal and normal heart sounds.   No murmur heard. Pulmonary/Chest: Breath sounds normal. No stridor. No respiratory distress. She has no wheezes. She has no rales.  Musculoskeletal: She exhibits no edema.  Lymphadenopathy:       Head (right side): No tonsillar adenopathy present.       Head (left side): No tonsillar adenopathy present.    She has no cervical adenopathy.  Neurological: She is alert. Gait normal.  Skin: No  rash noted. She is not diaphoretic. No erythema. Nails show no clubbing.  Psychiatric: Mood and affect normal.    Diagnostics: Results of the sleep study obtained 03/27/2016 identified the following:  - No significant obstructive sleep apnea occurred during this study (AHI = 0.1/h). - No significant central sleep apnea occurred during this study (CAI = 0.0/h). - The patient had minimal or no oxygen desaturation during the study (Min O2 = 90.00%) - The patient snored with Moderate snoring volume. - No cardiac abnormalities were noted during this study. - Clinically significant periodic limb movements did not occur during sleep. No significant associated arousals. - Review of clinical referral records suggests gastroesophageal reflux may best explain nocturnal choking episodes.   Spirometry was performed and demonstrated an FEV1 of 1.88 at 83 % of predicted.  The patient had an Asthma Control Test with the following results:  .  Assessment and Plan:   1. Asthma, moderate persistent, well-controlled   2. Other allergic rhinitis   3. Gastroesophageal reflux disease, esophagitis presence not specified     1. Continue a combination of the following:   A. budesonide 0.5 mg nebulized twice a day  B. Perforomist nebulized twice a day  2. Continue a combination of the following:   A. Nasonex or Flonase one spray each nostril twice a day  B. Patanase 1 spray each nostril twice a day   3. Continue montelukast 10 mg daily  4. Continue cetirizine 10 mg daily  5. Continue ranitidine 150 mg twice a day  6. Continue albuterol nebulization every 4-6 hours if needed  7. "Action plan" for asthma flare up:   A. increase budesonide to 4 times a day  B. continue Perforomist twice a day  C. use albuterol if needed  8.  Return to clinic in 12 weeks or earlier if problem  [name]'s doing much better regarding her respiratory tract and her reflux and her sleeping. I am now going to have her  continue to use the plan mentioned above and we'll see her back in this clinic in 12 weeks or earlier if there is a problem.  Laurette Schimke, MD Allergy / Immunology [facility]

## 2016-05-21 NOTE — Patient Instructions (Addendum)
  1. Continue a combination of the following:   A. budesonide 0.5 mg nebulized twice a day  B. Perforomist nebulized twice a day  2. Continue a combination of the following:   A. Nasonex or Flonase one spray each nostril twice a day  B. Patanase 1 spray each nostril twice a day   3. Continue montelukast 10 mg daily  4. Continue cetirizine 10 mg daily  5. Continue ranitidine 150 mg twice a day  6. Continue albuterol nebulization every 4-6 hours if needed  7. "Action plan" for asthma flare up:   A. increase budesonide to 4 times a day  B. continue Perforomist twice a day  C. use albuterol if needed  8.  Return to clinic in 12 weeks or earlier if problem

## 2016-05-22 ENCOUNTER — Encounter: Payer: Self-pay | Admitting: Allergy and Immunology

## 2016-06-20 ENCOUNTER — Other Ambulatory Visit: Payer: Self-pay

## 2016-06-20 MED ORDER — CETIRIZINE HCL 10 MG PO TABS: ORAL_TABLET | ORAL | 5 refills | Status: DC

## 2016-06-20 NOTE — Telephone Encounter (Signed)
Received fax from [facility] on [address] in regards to a refill for Cetirizine. I sent refill.

## 2016-06-24 DIAGNOSIS — G4730 Sleep apnea, unspecified: Secondary | ICD-10-CM | POA: Diagnosis not present

## 2016-06-24 DIAGNOSIS — J4520 Mild intermittent asthma, uncomplicated: Secondary | ICD-10-CM | POA: Diagnosis not present

## 2016-06-24 DIAGNOSIS — J302 Other seasonal allergic rhinitis: Secondary | ICD-10-CM | POA: Diagnosis not present

## 2016-06-25 ENCOUNTER — Ambulatory Visit (INDEPENDENT_AMBULATORY_CARE_PROVIDER_SITE_OTHER): Payer: Medicare Other | Admitting: Allergy and Immunology

## 2016-06-25 ENCOUNTER — Telehealth: Payer: Self-pay | Admitting: Allergy and Immunology

## 2016-06-25 ENCOUNTER — Encounter: Payer: Self-pay | Admitting: Allergy and Immunology

## 2016-06-25 VITALS — BP 110/60 | HR 120 | Resp 18

## 2016-06-25 DIAGNOSIS — J4540 Moderate persistent asthma, uncomplicated: Secondary | ICD-10-CM | POA: Diagnosis not present

## 2016-06-25 DIAGNOSIS — K219 Gastro-esophageal reflux disease without esophagitis: Secondary | ICD-10-CM

## 2016-06-25 DIAGNOSIS — J029 Acute pharyngitis, unspecified: Secondary | ICD-10-CM

## 2016-06-25 DIAGNOSIS — J3089 Other allergic rhinitis: Secondary | ICD-10-CM

## 2016-06-25 DIAGNOSIS — H6501 Acute serous otitis media, right ear: Secondary | ICD-10-CM

## 2016-06-25 MED ORDER — AZITHROMYCIN 500 MG PO TABS: ORAL_TABLET | ORAL | 0 refills | Status: DC

## 2016-06-25 NOTE — Progress Notes (Signed)
Follow-up Note  Referring Provider: Fleet Contras, MD Primary Provider: Dorrene German, MD Date of Office Visit: 06/25/2016  Subjective:   Lori Miles (DOB: [birth year]) is a 21 y.o. female who [facility]2016 in re-evaluation of the following:  HPI: Lori Miles presents to this clinic in evaluation of "wheezing" over the course of the past week. I last saw her in this clinic 05/21/2016 at which point in time she was doing relatively well with her allergic rhinitis and asthma and reflux.  Sometime last week she developed problems with wheezing and a slight cough and complaining about her throat hurting. She just hasn't felt very good this past week without any specific complaints. There has apparently not been any high fever but her throat hurts so bad that if she finds it hard to swallow on occasion.  Prior to this event she was really doing very well and had very little problem requiring the use of as needed medications but continued to consistently use preventative medications with budesonide and Perforomist and Flonase and montelukast and continued on therapy for reflux with the use of ranitidine. Her mom did increase her budesonide to 4 times a day recently and had her use albuterol..  Allergies as of 06/25/2016      Reactions   Abilify [aripiprazole] Swelling, Palpitations   Seroquel [quetiapine Fumarate] Palpitations   Doxycycline Other (See Comments)   Stomach cramps   Amoxicillin-pot Clavulanate Hives   Keppra [levetiracetam] Other (See Comments)   Aggressive behavior       Medication List      albuterol (2.5 MG/3ML) 0.083% nebulizer solution Commonly known as:  PROVENTIL Inhale the contents of one vial in nebulizer every four to six hours as needed for cough or wheeze.   benzonatate 100 MG capsule Commonly known as:  TESSALON Take 2 capsules (200 mg total) by mouth 2 (two) times daily as needed for cough.   budesonide 0.5  MG/2ML nebulizer solution Commonly known as:  PULMICORT Take 2 mLs (0.5 mg total) by nebulization 2 (two) times daily. One vial twice daily   cetirizine 10 MG tablet Commonly known as:  ZYRTEC Take one tablet daily for runny nose or itching.   cloNIDine 0.1 MG tablet Commonly known as:  CATAPRES Take 1+1/2  tab by mouth at bedtime.   Cranberry 500 MG Caps Take 500 mg by mouth daily. Reported on 09/07/2015   DEPAKOTE 500 MG DR tablet Generic drug:  divalproex take 1 tablet by mouth twice a day   formoterol 20 MCG/2ML nebulizer solution Commonly known as:  PERFOROMIST Take 2 mLs (20 mcg total) by nebulization 2 (two) times daily.   hyoscyamine 0.125 MG SL tablet Commonly known as:  LEVSIN SL DISSOLVE 1 TABLET UNDER THE TONGUE 2 TIMES A DAY   Levonorgestrel-Ethinyl Estradiol 0.15-0.03 &0.01 MG tablet Commonly known as:  SEASONIQUE Take 1 tablet by mouth daily. Take 1 active tablet daily, skip non active pills.   Melatonin 3 MG Tabs Take 3 mg by mouth at bedtime. For insomnia   montelukast 10 MG tablet Commonly known as:  SINGULAIR Take one tablet each evening to prevent cough or wheeze.   nitrofurantoin 100 MG capsule Commonly known as:  MACRODANTIN   ondansetron 4 MG tablet Commonly known as:  ZOFRAN Take 1 tablet (4 mg total) by mouth every 6 (six) hours.   polyethylene glycol powder powder Commonly known as:  GLYCOLAX/MIRALAX   ranitidine 150 MG tablet Commonly  known as:  ZANTAC Twice daily, morning and evening.   senna 8.6 MG tablet Commonly known as:  SENOKOT Take 1 tablet by mouth daily.   torsemide 10 MG tablet Commonly known as:  DEMADEX Take 15 mg by mouth daily.   traZODone 50 MG tablet Commonly known as:  DESYREL take 2 tablets by mouth at bedtime       Past Medical History:  Diagnosis Date  . Allergy   . Asthma   . Autism    mom states no actual dx, but admits she has some symptoms  . Bronchitis   . Constipation   . Frequency-urgency  syndrome     some incontinence ; wears pull-ups  . GERD (gastroesophageal reflux disease)   . Interstitial cystitis   . Mental deficiency    hx of aggressive behavior,impaired speech   . Pneumonia   . PONV (postoperative nausea and vomiting)   . Seizure (HCC)    most recent sz " at the end of summer"-last sz years ago per mother  . Sleep apnea    needing to order CPAP    Past Surgical History:  Procedure Laterality Date  . CYSTOSCOPY    . CYSTOSCOPY WITH HYDRODISTENSION AND BIOPSY  04/14/2012   Procedure: CYSTOSCOPY/BIOPSY/HYDRODISTENSION;  Surgeon: Lindaann Slough, MD;  Location: [facility];  Service: Urology;;  instillation of marcaine and pyridium  . TONSILLECTOMY      Review of systems negative except as noted in HPI / PMHx or noted below:  Review of Systems  Constitutional: Negative.   HENT: Negative.   Eyes: Negative.   Respiratory: Negative.   Cardiovascular: Negative.   Gastrointestinal: Negative.   Genitourinary: Negative.   Musculoskeletal: Negative.   Skin: Negative.   Neurological: Negative.   Endo/Heme/Allergies: Negative.   Psychiatric/Behavioral: Negative.      Objective:   Vitals:   06/25/16 1344  BP: 110/60  Pulse: (!) 120  Resp: 18          Physical Exam  Constitutional: She is well-developed, well-nourished, and in no distress.  HENT:  Head: Normocephalic.  Right Ear: External ear and ear canal normal. Tympanic membrane is erythematous.  Left Ear: Tympanic membrane, external ear and ear canal normal.  Nose: Mucosal edema (erythematous) present. No rhinorrhea.  Mouth/Throat: Uvula is midline and mucous membranes are normal. Posterior oropharyngeal erythema present. No oropharyngeal exudate.  Eyes: Conjunctivae are normal.  Neck: Trachea normal. No tracheal tenderness present. No tracheal deviation present. No thyromegaly present.  Cardiovascular: Normal rate, regular rhythm, S1 normal, S2 normal and normal heart sounds.     No murmur heard. Pulmonary/Chest: Breath sounds normal. No stridor. No respiratory distress. She has no wheezes. She has no rales.  Musculoskeletal: She exhibits no edema.  Lymphadenopathy:       Head (right side): No tonsillar adenopathy present.       Head (left side): No tonsillar adenopathy present.    She has no cervical adenopathy.  Neurological: She is alert. Gait normal.  Skin: No rash noted. She is not diaphoretic. No erythema. Nails show no clubbing.    Diagnostics:    Spirometry was performed and demonstrated an PEFR of 323 at 92 % of predicted.  Assessment and Plan:   1. Asthma, moderate persistent, well-controlled   2. Other allergic rhinitis   3. Gastroesophageal reflux disease, esophagitis presence not specified   4. Right acute serous otitis media, recurrence not specified   5. Pharyngitis, unspecified etiology     1.  Continue a combination of the following:   A. budesonide 0.5 mg nebulized twice a day  B. Perforomist nebulized twice a day  2. Continue a combination of the following:   A. Nasonex or Flonase one spray each nostril twice a day  B. Patanase 1 spray each nostril twice a day   3. Continue montelukast 10 mg daily  4. Continue cetirizine 10 mg daily  5. Continue ranitidine 150 mg twice a day  6. Continue albuterol nebulization every 4-6 hours if needed  7. "Action plan" for asthma flare up:   A. increase budesonide to 4 times a day  B. continue Perforomist twice a day  C. use albuterol if needed  8. For this most recent episode:   A. Prednisone  - two tablet one time per day for three days  B. Azithromycin 500 - one tablet one time per day for three days  9.  Return to clinic in 12 weeks or earlier if problem  I suspect that Lori Miles has a respiratory tract infection in the form of pharyngitis that also appears to be involving her right ear. I'll treat her with broad-spectrum antibiotics covering mycoplasma and a short course of  systemic steroids to help with the inflammatory component of this issue and assume that she'll continued to do well regarding her inflammatory respiratory condition and reflux condition that was previously under good control on her current medical plan. Her mom will keep in contact with me noting her response and if she does well then I will see her back in this clinic for her prearranged follow-up appointment which would be in June of this year.  Laurette Schimke, MD Allergy / Immunology [facility]

## 2016-06-25 NOTE — Patient Instructions (Addendum)
  1. Continue a combination of the following:   A. budesonide 0.5 mg nebulized twice a day  B. Perforomist nebulized twice a day  2. Continue a combination of the following:   A. Nasonex or Flonase one spray each nostril twice a day  B. Patanase 1 spray each nostril twice a day   3. Continue montelukast 10 mg daily  4. Continue cetirizine 10 mg daily  5. Continue ranitidine 150 mg twice a day  6. Continue albuterol nebulization every 4-6 hours if needed  7. "Action plan" for asthma flare up:   A. increase budesonide to 4 times a day  B. continue Perforomist twice a day  C. use albuterol if needed  8. For this most recent episode:   A. Prednisone  - two tablet one time per day for three days  B. Azithromycin 500 - one tablet one time per day for three days  9.  Return to clinic in 12 weeks or earlier if problem

## 2016-06-25 NOTE — Telephone Encounter (Signed)
Mom called and needs to talk with a nurse about Lori Miles asthma that is flare up, she needs to know to bring her in tomorrow morning or can she need predisone to help. [phone]

## 2016-06-25 NOTE — Telephone Encounter (Signed)
Appointment scheduled at 130 today

## 2016-06-28 ENCOUNTER — Telehealth: Payer: Self-pay | Admitting: Allergy and Immunology

## 2016-06-28 NOTE — Telephone Encounter (Signed)
Form has been filled out. Per Waynetta Sandy, form needs to be looked at by Dr. Lucie Leather before faxing to pharmacy due to excessive use of albuterol.

## 2016-06-28 NOTE — Telephone Encounter (Signed)
Mom called and said that pharmacy said they have sent paper work on albuterol 2 times and has not heard anything .[phone].

## 2016-07-02 NOTE — Telephone Encounter (Signed)
Will send medicare part B form today

## 2016-07-15 ENCOUNTER — Other Ambulatory Visit (HOSPITAL_BASED_OUTPATIENT_CLINIC_OR_DEPARTMENT_OTHER): Payer: Self-pay

## 2016-07-15 DIAGNOSIS — G4730 Sleep apnea, unspecified: Secondary | ICD-10-CM

## 2016-07-17 ENCOUNTER — Other Ambulatory Visit: Payer: Self-pay | Admitting: Pediatrics

## 2016-07-17 DIAGNOSIS — G4700 Insomnia, unspecified: Secondary | ICD-10-CM

## 2016-07-18 ENCOUNTER — Telehealth: Payer: Self-pay | Admitting: Allergy and Immunology

## 2016-07-18 ENCOUNTER — Emergency Department (HOSPITAL_COMMUNITY)
Admission: EM | Admit: 2016-07-18 | Discharge: 2016-07-19 | Disposition: A | Discharge status: Home / Self Care | Payer: Medicare Other | Attending: Emergency Medicine | Admitting: Emergency Medicine

## 2016-07-18 ENCOUNTER — Emergency Department (HOSPITAL_COMMUNITY): Payer: Medicare Other

## 2016-07-18 ENCOUNTER — Encounter (HOSPITAL_COMMUNITY): Payer: Self-pay | Admitting: Emergency Medicine

## 2016-07-18 DIAGNOSIS — R05 Cough: Secondary | ICD-10-CM | POA: Diagnosis not present

## 2016-07-18 DIAGNOSIS — F840 Autistic disorder: Secondary | ICD-10-CM | POA: Insufficient documentation

## 2016-07-18 DIAGNOSIS — J189 Pneumonia, unspecified organism: Secondary | ICD-10-CM

## 2016-07-18 DIAGNOSIS — R0602 Shortness of breath: Secondary | ICD-10-CM | POA: Diagnosis present

## 2016-07-18 DIAGNOSIS — Z79899 Other long term (current) drug therapy: Secondary | ICD-10-CM | POA: Diagnosis not present

## 2016-07-18 DIAGNOSIS — J181 Lobar pneumonia, unspecified organism: Secondary | ICD-10-CM | POA: Insufficient documentation

## 2016-07-18 DIAGNOSIS — J45909 Unspecified asthma, uncomplicated: Secondary | ICD-10-CM | POA: Diagnosis not present

## 2016-07-18 MED ORDER — IPRATROPIUM-ALBUTEROL 0.5-2.5 (3) MG/3ML IN SOLN
3.0000 mL | Freq: Once | RESPIRATORY_TRACT | Status: AC
  Administered 2016-07-18: 3 mL via RESPIRATORY_TRACT
  Filled 2016-07-18: qty 3

## 2016-07-18 MED ORDER — ACETAMINOPHEN 500 MG PO TABS
1000.0000 mg | ORAL_TABLET | Freq: Once | ORAL | Status: AC
  Administered 2016-07-19: 1000 mg via ORAL
  Filled 2016-07-18: qty 2

## 2016-07-18 NOTE — ED Triage Notes (Addendum)
Pt's mom st's pt has had a productive cough for several days also has been wheezing.  Pt received breathing tx prior to arrival to ED.  Np resp distress present at this time.  Mom st's pt coughs until she vomits

## 2016-07-18 NOTE — ED Provider Notes (Signed)
[facility]-EMERGENCY DEPT Provider Note   CSN: [id number] Arrival date & time: 07/18/16  2119     History   Chief Complaint Chief Complaint  Patient presents with  . Asthma    HPI Lori Miles is a 21 y.o. female.  Patient with past medical history of autism, asthma, and seizures, presents to the emergency department with chief complaint of shortness breath and cough. Mother states that she has been acting like she is having an asthma intact for the past couple of days. She reports that she was lightheaded and dizzy at school, felt like she is going to pass out. She states that she has been having decreased appetite. Mother reports associated productive cough, but denies any measured fever. Patient was seen recently by her pulmonologist, and was recently given some azithromycin.  She has not had any improvement.   The history is provided by the patient and a parent. No language interpreter was used.    Past Medical History:  Diagnosis Date  . Allergy   . Asthma   . Autism    mom states no actual dx, but admits she has some symptoms  . Bronchitis   . Constipation   . Frequency-urgency syndrome     some incontinence ; wears pull-ups  . GERD (gastroesophageal reflux disease)   . Interstitial cystitis   . Mental deficiency    hx of aggressive behavior,impaired speech   . Pneumonia   . PONV (postoperative nausea and vomiting)   . Seizure (HCC)    most recent sz " at the end of summer"-last sz years ago per mother  . Sleep apnea    needing to order CPAP    Patient Active Problem List   Diagnosis Date Noted  . Encounter for gynecological examination with Papanicolaou smear of cervix 03/12/2016  . Excessive daytime sleepiness 03/05/2016  . Insomnia 10/04/2015  . Acute bronchitis 05/05/2015  . Dyspnea 05/05/2015  . Gait disorder 03/22/2015  . 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 Cough variant asthma vs UACS/ vcd  03/15/2015  . CAP (community acquired pneumonia)  02/14/2015  . Autism spectrum disorder 02/14/2015  . Acute respiratory failure (HCC) 02/14/2015  . Nausea & vomiting 02/14/2015  . Disturbance in sleep behavior 12/13/2014  . Obesity, morbid (HCC) 12/13/2014  . 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 Acanthosis nigricans 11/14/2014  . Toeing-in 08/29/2014  . Obesity (BMI 30-39.9) 08/23/2013  . 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 Interstitial cystitis 04/28/2012  . Recurrent urinary tract infection 08/01/2011  . Asthma 08/01/2011  . Chronic constipation 08/01/2011  . Contraception 08/01/2011    Past Surgical History:  Procedure Laterality Date  . CYSTOSCOPY    . CYSTOSCOPY WITH HYDRODISTENSION AND BIOPSY  04/14/2012   Procedure: CYSTOSCOPY/BIOPSY/HYDRODISTENSION;  Surgeon: Lindaann Slough, MD;  Location: [facility];  Service: Urology;;  instillation of marcaine and pyridium  . TONSILLECTOMY      OB History    No data available       Home Medications    Prior to Admission medications   Medication Sig Start Date End Date Taking? Authorizing Provider  albuterol (PROVENTIL) (2.5 MG/3ML) 0.083% nebulizer solution Inhale the contents of one vial in nebulizer every four to six hours as needed for cough or wheeze. 04/23/16  Jessica PriestEric J Kozlow, MD  azithromycin (ZITHROMAX) 500 MG tablet Take one tablet once daily for 3 days 06/25/16   Jessica PriestEric J Kozlow, MD  benzonatate (TESSALON) 100 MG capsule Take 2 capsules (200 mg total) by mouth 2 (two) times daily as needed for cough. 02/26/16   Steuart HenleNicole Elizabeth Nadeau, PA-C  budesonide (PULMICORT) 0.5 MG/2ML nebulizer solution Take 2 mLs (0.5 mg total) by nebulization 2 (two) times daily. One vial twice daily 03/28/15    Baxter Hireoselyn M Hicks, MD  cetirizine (ZYRTEC) 10 MG tablet Take one tablet daily for runny nose or itching. 06/20/16   Jessica PriestEric J Kozlow, MD  cloNIDine (CATAPRES) 0.1 MG tablet take 1 and 1/2 tablets by mouth at bedtime 07/17/16   Elveria Risingina Goodpasture, NP  Cranberry 500 MG CAPS Take 500 mg by mouth daily. Reported on 09/07/2015    Historical Provider, MD  DEPAKOTE 500 MG DR tablet take 1 tablet by mouth twice a day 04/29/16   Elveria Risingina Goodpasture, NP  formoterol (PERFOROMIST) 20 MCG/2ML nebulizer solution Take 2 mLs (20 mcg total) by nebulization 2 (two) times daily. 04/25/16   Jessica PriestEric J Kozlow, MD  hyoscyamine (LEVSIN SL) 0.125 MG SL tablet DISSOLVE 1 TABLET UNDER THE TONGUE 2 TIMES A DAY 08/08/14   Historical Provider, MD  Levonorgestrel-Ethinyl Estradiol (SEASONIQUE) 0.15-0.03 &0.01 MG tablet Take 1 tablet by mouth daily. Take 1 active tablet daily, skip non active pills. 02/13/16   Rachelle A Denney, CNM  Melatonin 3 MG TABS Take 3 mg by mouth at bedtime. For insomnia    Historical Provider, MD  montelukast (SINGULAIR) 10 MG tablet Take one tablet each evening to prevent cough or wheeze. 09/07/15   Roselyn Kara MeadM Hicks, MD  nitrofurantoin (MACRODANTIN) 100 MG capsule  01/02/16   Historical Provider, MD  ondansetron (ZOFRAN) 4 MG tablet Take 1 tablet (4 mg total) by mouth every 6 (six) hours. 12/31/15   Tharon AquasFrank C Patrick, PA  polyethylene glycol powder [facility](GLYCOLAX/MIRALAX) powder  01/02/16   Historical Provider, MD  ranitidine (ZANTAC) 150 MG tablet Twice daily, morning and evening. 08/15/15   Beverley FiedlerJay M Pyrtle, MD  senna (SENOKOT) 8.6 MG tablet Take 1 tablet by mouth daily.    Historical Provider, MD  torsemide (DEMADEX) 10 MG tablet Take 15 mg by mouth daily.     Historical Provider, MD  traZODone (DESYREL) 50 MG tablet take 2 tablets by mouth at bedtime 04/29/16   Elveria Risingina Goodpasture, NP    Family History Family History  Problem Relation Age of Onset  . Seizures Mother   . Seizures Sister     1 sister has  . Pancreatic cancer Sister    . Colon cancer Neg Hx   . Colon polyps Neg Hx   . Esophageal cancer Neg Hx   . Rectal cancer Neg Hx   . Stomach cancer Neg Hx     Social History Social History  Substance Use Topics  . Smoking status: Never Smoker  . Smokeless tobacco: Never Used  . Alcohol use No     Allergies   Abilify [aripiprazole]; Seroquel [quetiapine fumarate]; Doxycycline; Amoxicillin-pot clavulanate; and Keppra [levetiracetam]   Review of Systems Review of Systems  All other systems reviewed and are negative.    Physical Exam Updated Vital Signs BP (!) 97/59 (BP Location: Right Arm)   Pulse 94   Temp 99.4 F (37.4 C) (Oral)   Resp 20   Ht 4\' 9"  (1.448 m)   Wt 82.6 kg   SpO2 100%   BMI 39.38  kg/m   Physical Exam  Constitutional: She is oriented to person, place, and time. She appears well-developed and well-nourished.  HENT:  Head: Normocephalic and atraumatic.  Eyes: Conjunctivae and EOM are normal. Pupils are equal, round, and reactive to light.  Neck: Normal range of motion. Neck supple.  Cardiovascular: Normal rate and regular rhythm.  Exam reveals no gallop and no friction rub.   No murmur heard. Pulmonary/Chest: Effort normal and breath sounds normal. No respiratory distress. She has no wheezes. She has no rales. She exhibits no tenderness.  Diminished breath sounds in bilateral bases  Abdominal: Soft. Bowel sounds are normal. She exhibits no distension and no mass. There is no tenderness. There is no rebound and no guarding.  Musculoskeletal: Normal range of motion. She exhibits no edema or tenderness.  Neurological: She is alert and oriented to person, place, and time.  Skin: Skin is warm and dry.  Psychiatric: She has a normal mood and affect. Her behavior is normal. Judgment and thought content normal.  Nursing note and vitals reviewed.    ED Treatments / Results  Labs (all labs ordered are listed, but only abnormal results are displayed) Labs Reviewed  CBC WITH  DIFFERENTIAL/PLATELET  BASIC METABOLIC PANEL  I-STAT CG4 LACTIC ACID, ED    EKG  EKG Interpretation None       Radiology Dg Chest 2 View  Result Date: 07/18/2016 CLINICAL DATA:  Initial evaluation for acute productive cough. Wheezing. EXAM: CHEST  2 VIEW COMPARISON:  Prior left central/ lung/17. FINDINGS: Cardiac and mediastinal silhouettes are stable in size and contour, and remain within normal limits. Lungs mildly hypoinflated. Mild scattered diffuse peribronchial thickening, which may reflect sequelae reactive airways disease and/or bronchiolitis. There are subtle superimposed more patchy densities within the left lung base, which may reflect superimposed bronchopneumonia. No other focal infiltrates. No pulmonary edema or pleural effusion. No pneumothorax. No acute osseus abnormality. IMPRESSION: Scattered diffuse peribronchial thickening, which may reflect sequelae of reactive airways disease and/or bronchiolitis. Few scattered more confluent patchy densities within the left lung base could reflect superimposed mild/early bronchopneumonia in the correct clinical setting. Electronically Signed   By: Rise Mu M.D.   On: 07/18/2016 22:05    Procedures Procedures (including critical care time)  Medications Ordered in ED Medications  acetaminophen (TYLENOL) tablet 1,000 mg (not administered)  ipratropium-albuterol (DUONEB) 0.5-2.5 (3) MG/3ML nebulizer solution 3 mL (3 mLs Nebulization Given 07/18/16 2353)     Initial Impression / Assessment and Plan / ED Course  I have reviewed the triage vital signs and the nursing notes.  Pertinent labs & imaging results that were available during my care of the patient were reviewed by me and considered in my medical decision making (see chart for details).     Patient with cough and chest tightness.  Afebrile, but has had productive cough.  Diminished breath sounds.  NAD.  Will give breathing treatment and check labs.  Mild  leukocytosis to 13.5. Patient ambulates maintaining greater than 94% pulse oxygenation. She is well-appearing. She is in no apparent distress. She is tolerating oral intake. I will treat her with Levaquin and prednisone for any asthma exacerbation. Levaquin for mild early pneumonia. Recommend close follow-up with PCP. Patient and mother understand and agree with the plan. Return precautions discussed. She is feeling improved. Eating injury to the emergency department.  Final Clinical Impressions(s) / ED Diagnoses   Final diagnoses:  Community acquired pneumonia of left lower lobe of lung (HCC)    New  Prescriptions New Prescriptions   LEVOFLOXACIN (LEVAQUIN) 250 MG TABLET    Take 3 tablets (750 mg total) by mouth daily.   PREDNISONE (DELTASONE) 20 MG TABLET    Take 2 tablets (40 mg total) by mouth daily.     Roxy Horseman, PA-C 07/19/16 1610    Geoffery Lyons, MD 07/19/16 [phone]

## 2016-07-18 NOTE — Telephone Encounter (Signed)
Mom called and said that she has started wheezing again like before and wanted to know if she needs another round of prednisone. [facility] on [address]. [phone].

## 2016-07-19 DIAGNOSIS — J181 Lobar pneumonia, unspecified organism: Secondary | ICD-10-CM | POA: Diagnosis not present

## 2016-07-19 LAB — CBC WITH DIFFERENTIAL/PLATELET
BASOS ABS: 0 10*3/uL (ref 0.0–0.1)
BASOS PCT: 0 %
EOS ABS: 0 10*3/uL (ref 0.0–0.7)
Eosinophils Relative: 0 %
HEMATOCRIT: 34.2 % — AB (ref 36.0–46.0)
HEMOGLOBIN: 11.1 g/dL — AB (ref 12.0–15.0)
Lymphocytes Relative: 26 %
Lymphs Abs: 3.5 10*3/uL (ref 0.7–4.0)
MCH: 28.8 pg (ref 26.0–34.0)
MCHC: 32.5 g/dL (ref 30.0–36.0)
MCV: 88.6 fL (ref 78.0–100.0)
MONOS PCT: 11 %
Monocytes Absolute: 1.5 10*3/uL — ABNORMAL HIGH (ref 0.1–1.0)
NEUTROS ABS: 8.5 10*3/uL — AB (ref 1.7–7.7)
NEUTROS PCT: 63 %
Platelets: 254 10*3/uL (ref 150–400)
RBC: 3.86 MIL/uL — AB (ref 3.87–5.11)
RDW: 13.9 % (ref 11.5–15.5)
WBC: 13.5 10*3/uL — AB (ref 4.0–10.5)

## 2016-07-19 LAB — BASIC METABOLIC PANEL
ANION GAP: 10 (ref 5–15)
BUN: 9 mg/dL (ref 6–20)
CHLORIDE: 105 mmol/L (ref 101–111)
CO2: 25 mmol/L (ref 22–32)
Calcium: 8.7 mg/dL — ABNORMAL LOW (ref 8.9–10.3)
Creatinine, Ser: 1.06 mg/dL — ABNORMAL HIGH (ref 0.44–1.00)
GFR calc non Af Amer: >60 mL/min (ref >60)
Glucose, Bld: 98 mg/dL (ref 65–99)
POTASSIUM: 3.9 mmol/L (ref 3.5–5.1)
SODIUM: 140 mmol/L (ref 135–145)

## 2016-07-19 LAB — I-STAT CG4 LACTIC ACID, ED: LACTIC ACID, VENOUS: 1.24 mmol/L (ref 0.5–1.9)

## 2016-07-19 MED ORDER — PREDNISONE 20 MG PO TABS: 40.0000 mg | ORAL_TABLET | Freq: Every day | ORAL | 0 refills | Status: DC

## 2016-07-19 MED ORDER — LEVOFLOXACIN 250 MG PO TABS: 750.0000 mg | ORAL_TABLET | Freq: Every day | ORAL | 0 refills | Status: DC

## 2016-07-19 NOTE — Telephone Encounter (Signed)
Patient was given Levaquin and Prednisone.

## 2016-07-19 NOTE — ED Notes (Signed)
Patient ambulated without incident and had a consistent 94% SPO2.

## 2016-07-19 NOTE — Telephone Encounter (Signed)
Patient went to the ER yesterday  evening

## 2016-07-19 NOTE — Telephone Encounter (Signed)
Please have her seen in clinic today.

## 2016-07-22 ENCOUNTER — Telehealth: Payer: Self-pay | Admitting: Allergy and Immunology

## 2016-07-22 MED ORDER — BUDESONIDE 0.5 MG/2ML IN SUSP: 0.5000 mg | Freq: Two times a day (BID) | RESPIRATORY_TRACT | 2 refills | Status: DC

## 2016-07-22 NOTE — Telephone Encounter (Signed)
Pt mom called and said that she was seen in the er and was given Levaquin and Prednisone, but mom said that she will be out of prednisone tomorrow and thinks she needs more. [phone].

## 2016-07-22 NOTE — Telephone Encounter (Addendum)
[name] states she is still wheezing and coughing. She is using her nebulizer BID, Pulmicort 4 times daily, and albuterol as needed with no relief. Mother is requesting some more Prednisone. Please advise.

## 2016-07-22 NOTE — Telephone Encounter (Signed)
[name] requested to see Dr. Lucie LeatherKozlow today. However, she did not want to drive to [place]. An appt was made for Tuesday, 07/23/16 at 3:15.

## 2016-07-22 NOTE — Telephone Encounter (Signed)
HAVE HER SEEN TODAY IN CLINIC

## 2016-07-23 ENCOUNTER — Ambulatory Visit (INDEPENDENT_AMBULATORY_CARE_PROVIDER_SITE_OTHER): Payer: Medicare Other | Admitting: Allergy and Immunology

## 2016-07-23 ENCOUNTER — Encounter: Payer: Self-pay | Admitting: Allergy and Immunology

## 2016-07-23 VITALS — BP 92/70 | HR 88 | Resp 18

## 2016-07-23 DIAGNOSIS — J3089 Other allergic rhinitis: Secondary | ICD-10-CM

## 2016-07-23 DIAGNOSIS — K219 Gastro-esophageal reflux disease without esophagitis: Secondary | ICD-10-CM | POA: Diagnosis not present

## 2016-07-23 DIAGNOSIS — J189 Pneumonia, unspecified organism: Secondary | ICD-10-CM | POA: Diagnosis not present

## 2016-07-23 DIAGNOSIS — J4541 Moderate persistent asthma with (acute) exacerbation: Secondary | ICD-10-CM | POA: Diagnosis not present

## 2016-07-23 MED ORDER — BUDESONIDE 1 MG/2ML IN SUSP: 1.0000 mg | Freq: Two times a day (BID) | RESPIRATORY_TRACT | 5 refills | Status: DC

## 2016-07-23 MED ORDER — OLOPATADINE HCL 0.6 % NA SOLN: NASAL | 5 refills | Status: DC

## 2016-07-23 MED ORDER — FLUTICASONE PROPIONATE 50 MCG/ACT NA SUSP: NASAL | 5 refills | Status: DC

## 2016-07-23 MED ORDER — CLINDAMYCIN HCL 150 MG PO CAPS: 150.0000 mg | ORAL_CAPSULE | Freq: Three times a day (TID) | ORAL | 0 refills | Status: AC

## 2016-07-23 NOTE — Progress Notes (Signed)
Follow-up Note  Referring Provider: Fleet Contras, MD Primary Provider: Fleet Contras, MD Date of Office Visit: 07/23/2016  Subjective:   Lori Miles (DOB: 1996/03/13) is a 21 y.o. female [facility] in re-evaluation of the following:  HPI: Lori Miles returns to this clinic in reevaluation of her asthma and allergic rhinitis and reflux-induced respiratory disease. I last saw her in his clinic 06/25/2016 . At that point in time she was treated with a short course of systemic steroids and azithromycin for what appeared to be a infectious disease-induced flare of her respiratory tract inflammatory condition. Unfortunately, she ended up in the emergency room this weekend and was given levofloxacin 750 mg daily and prednisone 40 mg a day for 5 days for issues with coughing and wheezing and ugly sputum production. Interestingly, she still continues to make ugly sputum production in the face of this therapy. She has not had any high fever or other significant problems other than cough and continued wheezing. Her nose is congested as well and she has not been having any problems with posttussive emesis.  Allergies as of 07/23/2016      Reactions   Abilify [aripiprazole] Swelling, Palpitations   Seroquel [quetiapine Fumarate] Palpitations   Doxycycline Other (See Comments)   Stomach cramps   Amoxicillin-pot Clavulanate Hives   Keppra [levetiracetam] Other (See Comments)   Aggressive behavior       Medication List      albuterol (2.5 MG/3ML) 0.083% nebulizer solution Commonly known as:  PROVENTIL Inhale the contents of one vial in nebulizer every four to six hours as needed for cough or wheeze.   benzonatate 100 MG capsule Commonly known as:  TESSALON Take 2 capsules (200 mg total) by mouth 2 (two) times daily as needed for cough.   budesonide 0.5 MG/2ML nebulizer solution Commonly known as:  PULMICORT Take 2 mLs (0.5 mg total) by  nebulization 2 (two) times daily. One vial twice daily. May increase to 4 times daily during asthma flares.   cetirizine 10 MG tablet Commonly known as:  ZYRTEC Take one tablet daily for runny nose or itching.   cloNIDine 0.1 MG tablet Commonly known as:  CATAPRES take 1 and 1/2 tablets by mouth at bedtime   Cranberry 500 MG Caps Take 500 mg by mouth daily. Reported on 09/07/2015   DEPAKOTE 500 MG DR tablet Generic drug:  divalproex take 1 tablet by mouth twice a day   formoterol 20 MCG/2ML nebulizer solution Commonly known as:  PERFOROMIST Take 2 mLs (20 mcg total) by nebulization 2 (two) times daily.   hyoscyamine 0.125 MG SL tablet Commonly known as:  LEVSIN SL DISSOLVE 1 TABLET UNDER THE TONGUE 2 TIMES A DAY   levofloxacin 250 MG tablet Commonly known as:  LEVAQUIN Take 3 tablets (750 mg total) by mouth daily.   Levonorgestrel-Ethinyl Estradiol 0.15-0.03 &0.01 MG tablet Commonly known as:  SEASONIQUE Take 1 tablet by mouth daily. Take 1 active tablet daily, skip non active pills.   Melatonin 3 MG Tabs Take 3 mg by mouth at bedtime. For insomnia   montelukast 10 MG tablet Commonly known as:  SINGULAIR Take one tablet each evening to prevent cough or wheeze.   ondansetron 4 MG tablet Commonly known as:  ZOFRAN Take 1 tablet (4 mg total) by mouth every 6 (six) hours.   ranitidine 150 MG tablet Commonly known as:  ZANTAC Twice daily, morning and evening.   senna 8.6 MG  tablet Commonly known as:  SENOKOT Take 1 tablet by mouth daily.   torsemide 10 MG tablet Commonly known as:  DEMADEX Take 15 mg by mouth daily.   traZODone 50 MG tablet Commonly known as:  DESYREL take 2 tablets by mouth at bedtime       Past Medical History:  Diagnosis Date  . Allergy   . Asthma   . Autism    mom states no actual dx, but admits she has some symptoms  . Bronchitis   . Constipation   . Frequency-urgency syndrome     some incontinence ; wears pull-ups  . GERD  (gastroesophageal reflux disease)   . Interstitial cystitis   . Mental deficiency    hx of aggressive behavior,impaired speech   . Pneumonia   . PONV (postoperative nausea and vomiting)   . Seizure (HCC)    most recent sz " at the end of summer"-last sz years ago per mother  . Sleep apnea    needing to order CPAP    Past Surgical History:  Procedure Laterality Date  . CYSTOSCOPY    . CYSTOSCOPY WITH HYDRODISTENSION AND BIOPSY  04/14/2012   Procedure: CYSTOSCOPY/BIOPSY/HYDRODISTENSION;  Surgeon: Lindaann SloughMarc-Henry Nesi, MD;  [facility];  Service: Urology;;  instillation of marcaine and pyridium  . TONSILLECTOMY      Review of systems negative except as noted in HPI / PMHx or noted below:  Review of Systems  Constitutional: Negative.   HENT: Negative.   Eyes: Negative.   Respiratory: Negative.   Cardiovascular: Negative.   Gastrointestinal: Negative.   Genitourinary: Negative.   Musculoskeletal: Negative.   Skin: Negative.   Neurological: Negative.   Endo/Heme/Allergies: Negative.   Psychiatric/Behavioral: Negative.      Objective:   Vitals:   07/23/16 1520  BP: 92/70  Pulse: 88  Resp: 18          Physical Exam  Constitutional: She is well-developed, well-nourished, and in no distress.  Green sputum in tissue, coughing  HENT:  Head: Normocephalic.  Right Ear: Tympanic membrane, external ear and ear canal normal.  Left Ear: Tympanic membrane, external ear and ear canal normal.  Nose: Nose normal. No mucosal edema or rhinorrhea.  Mouth/Throat: Uvula is midline, oropharynx is clear and moist and mucous membranes are normal. No oropharyngeal exudate.  Eyes: Conjunctivae are normal.  Neck: Trachea normal. No tracheal tenderness present. No tracheal deviation present. No thyromegaly present.  Cardiovascular: Normal rate, regular rhythm, S1 normal, S2 normal and normal heart sounds.   No murmur heard. Pulmonary/Chest: Breath sounds normal. No  stridor. No respiratory distress. She has no wheezes. She has no rales.  Musculoskeletal: She exhibits no edema.  Lymphadenopathy:       Head (right side): No tonsillar adenopathy present.       Head (left side): No tonsillar adenopathy present.    She has no cervical adenopathy.  Neurological: She is alert. Gait normal.  Skin: No rash noted. She is not diaphoretic. No erythema. Nails show no clubbing.  Psychiatric: Mood and affect normal.    Diagnostics: Results of a chest x-ray obtained on 07/18/2016 identify the following:  Lungs mildly hypoinflated. Mild scattered diffuse peribronchial thickening, which may reflect sequelae reactive airways disease and/or bronchiolitis. There are subtle superimposed more patchy densities within the left lung base, which may reflect superimposed bronchopneumonia. No other focal infiltrates. No pulmonary edema or pleural effusion. No pneumothorax.  Results of blood tests obtained on 07/19/2016 identified a white blood  cell count of 13.5 with a left shift, and absolute eosinophil count of 0, hemoglobin 11.1, platelet 254.  Assessment and Plan:   1. Asthma, not well controlled, moderate persistent, with acute exacerbation   2. Other allergic rhinitis   3. Gastroesophageal reflux disease, esophagitis presence not specified   4. Pneumonia of both lungs due to infectious organism, unspecified part of lung     1. Continue a combination of the following:   A. budesonide 0.5 mg nebulized twice a day  B. Perforomist nebulized twice a day  2. Continue a combination of the following:   A. Nasonex or Flonase one spray each nostril twice a day  B. Patanase 1 spray each nostril twice a day   3. Continue montelukast 10 mg daily  4. Continue cetirizine 10 mg daily  5. Continue ranitidine 150 mg twice a day  6. Continue albuterol nebulization every 4-6 hours if needed  7. "Action plan" for asthma flare up:   A. increase budesonide to 4 times a  day  B. continue Perforomist twice a day  C. use albuterol if needed  8. For this most recent episode:   A. Clindamycin 150 one tablet three times a day for 10 days   9. Blood - IgA/G/M, IgE, anti-pneumococcal ab, antitetanus ab, area two profile  9.  Return to clinic in 2 weeks or earlier if problem  I will have Maleta use clindamycin for the next 10 days to treat what possibly could be a persistent pneumonia possibly with MRSA given the fact that she has not responded adequately to good medical therapy including the use of Levaquin for the past 5 days. She will continue on anti-inflammatory agents for her respiratory tract and therapy directed against reflux as noted above. I'm going to check her blood to see if she does have a intact B-cell immune system to mount a response against infection and also to work through whether or not she is atopic. I'll see her back in this clinic in 2 weeks or earlier if there is a problem.  Laurette Schimke, MD Allergy / Immunology [facility]

## 2016-07-23 NOTE — Patient Instructions (Addendum)
  1. Continue a combination of the following:   A. budesonide 0.5 mg nebulized twice a day  B. Perforomist nebulized twice a day  2. Continue a combination of the following:   A. Nasonex or Flonase one spray each nostril twice a day  B. Patanase 1 spray each nostril twice a day   3. Continue montelukast 10 mg daily  4. Continue cetirizine 10 mg daily  5. Continue ranitidine 150 mg twice a day  6. Continue albuterol nebulization every 4-6 hours if needed  7. "Action plan" for asthma flare up:   A. increase budesonide to 4 times a day  B. continue Perforomist twice a day  C. use albuterol if needed  8. For this most recent episode:   A. Clindamycin 150 one tablet three times a day for 10 days   9. Blood - IgA/G/M, IgE, anti-pneumococcal ab, antitetanus ab, area two profile  9.  Return to clinic in 2 weeks or earlier if problem

## 2016-07-24 ENCOUNTER — Other Ambulatory Visit: Payer: Self-pay | Admitting: Internal Medicine

## 2016-07-24 ENCOUNTER — Telehealth: Payer: Self-pay | Admitting: Internal Medicine

## 2016-07-24 DIAGNOSIS — K2990 Gastroduodenitis, unspecified, without bleeding: Secondary | ICD-10-CM

## 2016-07-24 DIAGNOSIS — K2970 Gastritis, unspecified, without bleeding: Secondary | ICD-10-CM

## 2016-07-24 DIAGNOSIS — R059 Cough, unspecified: Secondary | ICD-10-CM

## 2016-07-24 DIAGNOSIS — K219 Gastro-esophageal reflux disease without esophagitis: Secondary | ICD-10-CM

## 2016-07-24 DIAGNOSIS — R05 Cough: Secondary | ICD-10-CM

## 2016-07-24 NOTE — Telephone Encounter (Signed)
Pts mother called and wanted to let us know that the pharmacy sent in a refill request for zantac. Discussed with her that the refill has already been sent.

## 2016-07-25 ENCOUNTER — Telehealth: Payer: Self-pay | Admitting: Allergy and Immunology

## 2016-07-25 DIAGNOSIS — J189 Pneumonia, unspecified organism: Secondary | ICD-10-CM

## 2016-07-25 MED ORDER — PREDNISONE 10 MG PO TABS: ORAL_TABLET | ORAL | 0 refills | Status: DC

## 2016-07-25 NOTE — Telephone Encounter (Signed)
Mom called and want to talk with someone about her cough and congestion. She is cough up yellowish green and was up all night coughing. Mom wants some predisone for her

## 2016-07-25 NOTE — Telephone Encounter (Signed)
Please have her start prednisone 10 mg a day every day until she comes back to see me in 2 weeks. Please get her an appointment with [facility] for persistent pulmonary infection.

## 2016-07-25 NOTE — Telephone Encounter (Signed)
I called patient. Spoke to mom informed her that we sent in the Prednisone. I also informed her to follow up with pulmonary. Mom stated that [facility] dismissed her. Mom wants to know if you could recommend a pulmonary doctor in [place]?   Please advise.

## 2016-07-29 NOTE — Addendum Note (Signed)
Addended by: Clifton JamesLARK, Jordan Caraveo L on: 07/29/2016 11:53 AM   Modules accepted: Orders

## 2016-07-29 NOTE — Addendum Note (Signed)
Addended by: Clifton JamesLARK, HEATHER L on: 07/29/2016 12:02 PM   Modules accepted: Orders

## 2016-07-29 NOTE — Telephone Encounter (Signed)
No other pulmonary doctors in [place]. Have her get a sputum sample for AFB smear, AFB culture, bacterial culture and gram stain.

## 2016-07-30 NOTE — Telephone Encounter (Signed)
Informed patient's mom that need to get a sputum sample on patient. Mom states patient is not coughing anything up currently but if she start again she will take her to the lab for a sample.

## 2016-07-31 DIAGNOSIS — J4541 Moderate persistent asthma with (acute) exacerbation: Secondary | ICD-10-CM | POA: Diagnosis not present

## 2016-07-31 DIAGNOSIS — J189 Pneumonia, unspecified organism: Secondary | ICD-10-CM | POA: Diagnosis not present

## 2016-07-31 DIAGNOSIS — J3089 Other allergic rhinitis: Secondary | ICD-10-CM | POA: Diagnosis not present

## 2016-08-06 ENCOUNTER — Ambulatory Visit (INDEPENDENT_AMBULATORY_CARE_PROVIDER_SITE_OTHER): Payer: Medicare Other | Admitting: Allergy and Immunology

## 2016-08-06 VITALS — BP 90/68 | HR 100 | Resp 20

## 2016-08-06 DIAGNOSIS — K219 Gastro-esophageal reflux disease without esophagitis: Secondary | ICD-10-CM

## 2016-08-06 DIAGNOSIS — J3089 Other allergic rhinitis: Secondary | ICD-10-CM | POA: Diagnosis not present

## 2016-08-06 DIAGNOSIS — J4540 Moderate persistent asthma, uncomplicated: Secondary | ICD-10-CM

## 2016-08-06 DIAGNOSIS — B999 Unspecified infectious disease: Secondary | ICD-10-CM | POA: Diagnosis not present

## 2016-08-06 LAB — PNEUMOCOCCAL AB (23 SEROTYPE)
PNEUMO AB TYPE 1: 2.1 ug/mL (ref >1.3)
PNEUMO AB TYPE 34 (10A): 0.1 ug/mL — AB (ref >1.3)
PNEUMO AB TYPE 3: 0.2 ug/mL — AB (ref >1.3)
Pneumo Ab Type 14*: <0.1 ug/mL — ABNORMAL LOW (ref >1.3)
Pneumo Ab Type 2*: 0.5 ug/mL — ABNORMAL LOW (ref >1.3)
Pneumo Ab Type 20*: 0.1 ug/mL — ABNORMAL LOW (ref >1.3)
Pneumo Ab Type 22 (22F)*: <0.1 ug/mL — ABNORMAL LOW (ref >1.3)
Pneumo Ab Type 23 (23F)*: <0.1 ug/mL — ABNORMAL LOW (ref >1.3)
Pneumo Ab Type 26 (6B)*: <0.1 ug/mL — ABNORMAL LOW (ref >1.3)
Pneumo Ab Type 4*: <0.1 ug/mL — ABNORMAL LOW (ref >1.3)
Pneumo Ab Type 51 (7F)*: 0.1 ug/mL — ABNORMAL LOW (ref >1.3)
Pneumo Ab Type 54 (15B)*: 0.5 ug/mL — ABNORMAL LOW (ref >1.3)
Pneumo Ab Type 56 (18C)*: <0.1 ug/mL — ABNORMAL LOW (ref >1.3)
Pneumo Ab Type 57 (19A)*: 0.2 ug/mL — ABNORMAL LOW (ref >1.3)
Pneumo Ab Type 68 (9V)*: <0.1 ug/mL — ABNORMAL LOW (ref >1.3)
Pneumo Ab Type 70 (33F)*: 0.1 ug/mL — ABNORMAL LOW (ref >1.3)

## 2016-08-06 LAB — ALLERGENS W/TOTAL IGE AREA 2
Aspergillus Fumigatus IgE: <0.1 kU/L
Bermuda Grass IgE: <0.1 kU/L
Cockroach, German IgE: <0.1 kU/L
Cottonwood IgE: <0.1 kU/L
Dog Dander IgE: <0.1 kU/L
IgE (Immunoglobulin E), Serum: 7 IU/mL (ref 0–100)
Johnson Grass IgE: <0.1 kU/L
Mouse Urine IgE: <0.1 kU/L
Pecan, Hickory IgE: <0.1 kU/L
Pigweed, Rough IgE: <0.1 kU/L
Ragweed, Short IgE: <0.1 kU/L
Timothy Grass IgE: <0.1 kU/L
White Mulberry IgE: <0.1 kU/L

## 2016-08-06 LAB — IGG, IGA, IGM
IGA/IMMUNOGLOBULIN A, SERUM: 72 mg/dL — AB (ref 87–352)
IGG (IMMUNOGLOBIN G), SERUM: 753 mg/dL (ref 700–1600)
IGM (IMMUNOGLOBULIN M), SRM: 90 mg/dL (ref 26–217)

## 2016-08-06 LAB — TETANUS ANTIBODY, IGG: Tetanus Ab, IgG: <0.1 IU/mL — ABNORMAL LOW (ref <0.10)

## 2016-08-06 MED ORDER — PNEUMOCOCCAL VAC POLYVALENT 25 MCG/0.5ML IJ INJ
0.5000 mL | INJECTION | Freq: Once | INTRAMUSCULAR | Status: AC
  Administered 2016-08-06: 0.5 mL via INTRAMUSCULAR

## 2016-08-06 NOTE — Patient Instructions (Addendum)
  1. Continue a combination of the following:   A. budesonide 0.5 mg nebulized twice a day  B. Perforomist nebulized twice a day  2. Continue a combination of the following:   A. Nasonex or Flonase one spray each nostril twice a day  B. Patanase 1 spray each nostril twice a day   3. Continue montelukast 10 mg daily  4. Continue cetirizine 10 mg daily  5. Continue ranitidine 150 mg twice a day  6. Continue albuterol nebulization every 4-6 hours if needed  7. "Action plan" for asthma flare up:   A. increase budesonide to 4 times a day  B. continue Perforomist twice a day  C. use albuterol if needed  8. Blood - B cell memory panel, immunoelectrophoresis  9. Pneumovax delivered in clinic today  10. Return to clinic in 4 weeks or earlier if problem  11. Get a DPT booster with primary care physician

## 2016-08-06 NOTE — Progress Notes (Signed)
Follow-up Note  Referring Provider: Fleet ContrasAvbuere, Edwin, MD Primary Provider: Fleet ContrasAvbuere, Edwin, MD Date of Office Visit: 08/06/2016  Subjective:   Lori Miles (DOB: 03-01-1996) is a 21 y.o. female [facility] in re-evaluation of the following:  HPI: [name] returns to this clinic in reevaluation of her asthma and allergic rhinoconjunctivitis and history of reflux-induced respiratory disease and recurrent infections. I last saw her in this clinic on 07/23/2016 at which point in time she utilized a collection of medical therapy to address these issues.  She is doing much better at this point in time and has resolved her cough and sputum production and any type of breathing issue involving either her nose or chest. She still continues to use a large collection of medical treatment directed against respiratory tract inflammation but has no need to use any short acting bronchodilator at this point. She has not been having any issues with reflux.  Allergies as of 08/06/2016      Reactions   Abilify [aripiprazole] Swelling, Palpitations   Seroquel [quetiapine Fumarate] Palpitations   Doxycycline Other (See Comments)   Stomach cramps   Amoxicillin-pot Clavulanate Hives   Keppra [levetiracetam] Other (See Comments)   Aggressive behavior       Medication List      albuterol (2.5 MG/3ML) 0.083% nebulizer solution Commonly known as:  PROVENTIL Inhale the contents of one vial in nebulizer every four to six hours as needed for cough or wheeze.   benzonatate 100 MG capsule Commonly known as:  TESSALON Take 2 capsules (200 mg total) by mouth 2 (two) times daily as needed for cough.   budesonide 0.5 MG/2ML nebulizer solution Commonly known as:  PULMICORT Take 2 mLs (0.5 mg total) by nebulization 2 (two) times daily. One vial twice daily. May increase to 4 times daily during asthma flares.   budesonide 1 MG/2ML nebulizer solution Commonly known  as:  PULMICORT Take 2 mLs (1 mg total) by nebulization 2 (two) times daily.   cetirizine 10 MG tablet Commonly known as:  ZYRTEC Take one tablet daily for runny nose or itching.   cloNIDine 0.1 MG tablet Commonly known as:  CATAPRES take 1 and 1/2 tablets by mouth at bedtime   Cranberry 500 MG Caps Take 500 mg by mouth daily. Reported on 09/07/2015   DEPAKOTE 500 MG DR tablet Generic drug:  divalproex take 1 tablet by mouth twice a day   formoterol 20 MCG/2ML nebulizer solution Commonly known as:  PERFOROMIST Take 2 mLs (20 mcg total) by nebulization 2 (two) times daily.   hyoscyamine 0.125 MG SL tablet Commonly known as:  LEVSIN SL DISSOLVE 1 TABLET UNDER THE TONGUE 2 TIMES A DAY   Levonorgestrel-Ethinyl Estradiol 0.15-0.03 &0.01 MG tablet Commonly known as:  SEASONIQUE Take 1 tablet by mouth daily. Take 1 active tablet daily, skip non active pills.   Melatonin 3 MG Tabs Take 3 mg by mouth at bedtime. For insomnia   montelukast 10 MG tablet Commonly known as:  SINGULAIR Take one tablet each evening to prevent cough or wheeze.   Olopatadine HCl 0.6 % Soln Use one spray in each nostril twice a day.   ondansetron 4 MG tablet Commonly known as:  ZOFRAN Take 1 tablet (4 mg total) by mouth every 6 (six) hours.   ranitidine 150 MG tablet Commonly known as:  ZANTAC take 1 tablet by mouth twice a day MORNING AND EVENING   senna 8.6 MG tablet  Commonly known as:  SENOKOT Take 1 tablet by mouth daily.   torsemide 10 MG tablet Commonly known as:  DEMADEX Take 15 mg by mouth daily.   traZODone 50 MG tablet Commonly known as:  DESYREL take 2 tablets by mouth at bedtime       Past Medical History:  Diagnosis Date  . Allergy   . Asthma   . Autism    mom states no actual dx, but admits she has some symptoms  . Bronchitis   . Constipation   . Frequency-urgency syndrome     some incontinence ; wears pull-ups  . GERD (gastroesophageal reflux disease)   .  Interstitial cystitis   . Mental deficiency    hx of aggressive behavior,impaired speech   . Pneumonia   . PONV (postoperative nausea and vomiting)   . Seizure (HCC)    most recent sz " at the end of summer"-last sz years ago per mother  . Sleep apnea    needing to order CPAP    Past Surgical History:  Procedure Laterality Date  . CYSTOSCOPY    . CYSTOSCOPY WITH HYDRODISTENSION AND BIOPSY  04/14/2012   Procedure: CYSTOSCOPY/BIOPSY/HYDRODISTENSION;  Surgeon: Lindaann Slough, MD;  Location: [facility] [employer];  Service: Urology;;  instillation of marcaine and pyridium  . TONSILLECTOMY      Review of systems negative except as noted in HPI / PMHx or noted below:  Review of Systems  Constitutional: Negative.   HENT: Negative.   Eyes: Negative.   Respiratory: Negative.   Cardiovascular: Negative.   Gastrointestinal: Negative.   Genitourinary: Negative.   Musculoskeletal: Negative.   Skin: Negative.   Neurological: Negative.   Endo/Heme/Allergies: Negative.   Psychiatric/Behavioral: Negative.      Objective:   Vitals:   08/06/16 1458  BP: 90/68  Pulse: 100  Resp: 20          Physical Exam  Constitutional: She is well-developed, well-nourished, and in no distress.  HENT:  Head: Normocephalic.  Right Ear: Tympanic membrane, external ear and ear canal normal.  Left Ear: Tympanic membrane, external ear and ear canal normal.  Nose: Nose normal. No mucosal edema or rhinorrhea.  Mouth/Throat: Uvula is midline, oropharynx is clear and moist and mucous membranes are normal. No oropharyngeal exudate.  Eyes: Conjunctivae are normal.  Neck: Trachea normal. No tracheal tenderness present. No tracheal deviation present. No thyromegaly present.  Cardiovascular: Normal rate, regular rhythm, S1 normal, S2 normal and normal heart sounds.   No murmur heard. Pulmonary/Chest: Breath sounds normal. No stridor. No respiratory distress. She has no wheezes. She has no rales.    Musculoskeletal: She exhibits no edema.  Lymphadenopathy:       Head (right side): No tonsillar adenopathy present.       Head (left side): No tonsillar adenopathy present.    She has no cervical adenopathy.  Neurological: She is alert. Gait normal.  Skin: No rash noted. She is not diaphoretic. No erythema. Nails show no clubbing.  Psychiatric: Mood and affect normal.    Diagnostics: Results of blood tests obtained on 07/31/2016 identified a serum IgG 753, IgM 90, and IgA 72 MG/DL. She had no significant titer of antibodies against a screening of pneumococcal serotypes. She demonstrated no IgE antibodies against a collection of various aeroallergens in the area 2 aero allergen profile area. She demonstrated less than 0.10 international units/mL of antitetanus toxoid antibody.  Results of a chest CT angio performed 09 May 2015 identified the following:  Good contrast bolus timing in the pulmonary arterial tree. Low lung volumes. No focal filling defect identified in the pulmonary arterial tree to suggest the presence of acute pulmonary embolism.  Major airways are patent. Aside from crowding of lung markings which is slightly greater on the left, both lungs appear clear. No pleural effusion.  No pericardial effusion. Negative thoracic aorta. No mediastinal or hilar lymphadenopathy.  Negative thoracic inlet. No axillary lymphadenopathy. Stable and negative visualized liver, spleen, pancreas, adrenal glands, kidneys, and bowel in the upper abdomen.  Results of a sinus CT scan obtained 07/07/2015 identified the following:  Small retention cysts or polyps in bilateral maxillary sinuses. Remainder visualized paranasal sinuses normally developed and well aerated on survey images. Visualized mastoid air cells are normally aerated. Nasal septum midline. Regional bones unremarkable. Regional soft tissues unremarkable. Limited visualization of intracranial contents grossly  unremarkable.  Results of a chest x-ray obtained 07/18/2016 identified the following:  Cardiac and mediastinal silhouettes are stable in size and contour, and remain within normal limits.  Lungs mildly hypoinflated. Mild scattered diffuse peribronchial thickening, which may reflect sequelae reactive airways disease and/or bronchiolitis. There are subtle superimposed more patchy densities within the left lung base, which may reflect superimposed bronchopneumonia. No other focal infiltrates. No pulmonary edema or pleural effusion. No pneumothorax.   Spirometry was performed and demonstrated an FEV1 of 1.93 at 86 % of predicted.  The patient had an Asthma Control Test with the following results: ACT Total Score: 25.    Assessment and Plan:   1. Asthma, moderate persistent, well-controlled   2. Other allergic rhinitis   3. Recurrent infections   4. Gastroesophageal reflux disease, esophagitis presence not specified     1. Continue a combination of the following:   A. budesonide 0.5 mg nebulized twice a day  B. Perforomist nebulized twice a day  2. Continue a combination of the following:   A. Nasonex or Flonase one spray each nostril twice a day  B. Patanase 1 spray each nostril twice a day   3. Continue montelukast 10 mg daily  4. Continue cetirizine 10 mg daily  5. Continue ranitidine 150 mg twice a day  6. Continue albuterol nebulization every 4-6 hours if needed  7. "Action plan" for asthma flare up:   A. increase budesonide to 4 times a day  B. continue Perforomist twice a day  C. use albuterol if needed  8. Blood - B cell memory panel, immunoelectrophoresis  9. Pneumovax delivered in clinic today  10. Return to clinic in 4 weeks or earlier if problem  11. Get a DPT booster with primary care physician  Although Kemonie is doing well regarding symptoms she certainly has abnormal laboratory analysis suggesting that she cannot make antigen specific antibodies  directed against common pathogens including pneumococcus and tetanus. We will evaluate her B cell memory, administer the Pneumovax today, and get her a DPT booster in anticipation of rechecking her anti-pneumococcal antibody titers and tetanus antibody titers in 4 weeks. If she has difficulty during the interval her mom will contact me for further evaluation and treatment.  Laurette Schimke, MD Allergy / Immunology [facility]

## 2016-08-07 ENCOUNTER — Encounter: Payer: Self-pay | Admitting: Allergy and Immunology

## 2016-08-08 ENCOUNTER — Telehealth: Payer: Self-pay | Admitting: Allergy and Immunology

## 2016-08-08 NOTE — Telephone Encounter (Signed)
Gave mother instructions on how to use the plumicort. She understood

## 2016-08-08 NOTE — Telephone Encounter (Signed)
Pt mom called and needs the instruction on the pulmicort

## 2016-08-20 ENCOUNTER — Telehealth: Payer: Self-pay | Admitting: Allergy and Immunology

## 2016-08-20 DIAGNOSIS — B999 Unspecified infectious disease: Secondary | ICD-10-CM | POA: Diagnosis not present

## 2016-08-20 NOTE — Telephone Encounter (Signed)
Dr. Lucie LeatherKozlow, do you know what Mom is referring to?

## 2016-08-20 NOTE — Telephone Encounter (Signed)
Pt mom called and said that they were at the place for labs now. And they need a order for them to test for what she is coughing up. [phone].

## 2016-08-20 NOTE — Telephone Encounter (Signed)
Not entirely sure. I assume if this is a sputum culture it would be for bacteria culture, fungus culture, and mycoplasma culture with AFB smear.

## 2016-08-22 ENCOUNTER — Other Ambulatory Visit: Payer: Self-pay | Admitting: Certified Nurse Midwife

## 2016-08-22 NOTE — Telephone Encounter (Signed)
Spoke with patient's mom and she has disposed of the sputum now. Will call back if occurs again. She wanted me to let you know that she has began using the pulmicort nebulizer solution 1 mg again. States that she feels the 1 mg works better during the flare ups than the 0.5 mg.

## 2016-08-23 ENCOUNTER — Other Ambulatory Visit: Payer: Self-pay | Admitting: Obstetrics

## 2016-08-23 ENCOUNTER — Other Ambulatory Visit: Payer: Self-pay

## 2016-08-23 LAB — IMMUNOFIXATION ELECTROPHORESIS
IGG (IMMUNOGLOBIN G), SERUM: 836 mg/dL (ref 700–1600)
IgA/Immunoglobulin A, Serum: 101 mg/dL (ref 87–352)
IgM (Immunoglobulin M), Srm: 113 mg/dL (ref 26–217)
TOTAL PROTEIN: 6.6 g/dL (ref 6.0–8.5)

## 2016-08-23 LAB — B-CELL MEMORY AND NAIVE PANEL
B-CELLS ABSOLUTE CD19: 373 {cells}/uL (ref 58–558)
B-cells % CD19: 11 % (ref 5–26)
Class-switched Abs: 24 cells/uL (ref 4–62)
Class-switched Memory %: 6 % (ref 3–23)
IgM Only Memory %: 1.3 % (ref 0.3–6.0)
IgM Only Memory Abs: 4.8 cells/uL (ref 0.6–16.4)
NAIVE B-CELL % CD19+/CD27-/IGD+: 75 % (ref 29–93)
NAIVE BCL ABS CD19+/CD27-/IGD+: 280 {cells}/uL (ref 22–423)
NON-SWITCH ABS: 34 {cells}/uL (ref 4–66)
NON-SWITCHED MEMORY %: 9 % (ref 2–25)
Tot Mem BCL Absol CD19+/CD27+: 63 cells/uL (ref 13–148)
Total Memory B-cell%CD19/CD27+: 17 % (ref 7–48)

## 2016-08-23 MED ORDER — MONTELUKAST SODIUM 10 MG PO TABS: ORAL_TABLET | ORAL | 5 refills | Status: DC

## 2016-08-28 ENCOUNTER — Other Ambulatory Visit: Payer: Self-pay | Admitting: Internal Medicine

## 2016-08-28 DIAGNOSIS — K219 Gastro-esophageal reflux disease without esophagitis: Secondary | ICD-10-CM

## 2016-08-28 DIAGNOSIS — K2990 Gastroduodenitis, unspecified, without bleeding: Secondary | ICD-10-CM

## 2016-08-28 DIAGNOSIS — R059 Cough, unspecified: Secondary | ICD-10-CM

## 2016-08-28 DIAGNOSIS — K2970 Gastritis, unspecified, without bleeding: Secondary | ICD-10-CM

## 2016-08-28 DIAGNOSIS — R05 Cough: Secondary | ICD-10-CM

## 2016-08-30 ENCOUNTER — Telehealth: Payer: Self-pay | Admitting: Allergy and Immunology

## 2016-08-30 NOTE — Telephone Encounter (Signed)
Spoke with patient's mom regarding Cheyrl and her Pulmicort.  Per mom, papers were faxed yesterday from [facility] to fill out and patient is almost out of her medication.  Forms were done in May with refills on both Pulmicort 1 mg and 0.5 mg.   Patient has had recent flare and has increased Pulmicort 0.5 mg to four times a day.  Mom is insisting that same forms need to completed again.  [facility] or today.  [facility] and spoke to the pharmacist Aurther Loft(Terry).  Per Aurther Lofterry, we did not need to do the Medicare forms again, the ones completed in May were still good.  Patient needs PA done in order to get more Pulmicort at this time due to it being too soon for a refill this week.  Per Aurther Lofterry, he will hold PA and run medication for refill at the beginning of next week since patient still has medication per mom.  Called mom back and she insisted that I was given the incorrect information by pharmacy.  I gave her the name of who I spoke with and informed her she needed to contact the pharmacy.  [facility] and also [facility] follow up appointment made with Dr. Lucie LeatherKozlow.  Patient's mother stated that she thought she needed to be seen later than 4 weeks from last OV and that she was not at home and would call back to make an appointment.

## 2016-08-30 NOTE — Telephone Encounter (Signed)
Pt mom called and needs papers filed out for pulmicort 1mg . And pulmicort 0.5 to go to pharmacy. [phone].

## 2016-09-03 ENCOUNTER — Other Ambulatory Visit: Payer: Self-pay | Admitting: Internal Medicine

## 2016-09-03 ENCOUNTER — Telehealth: Payer: Self-pay | Admitting: Internal Medicine

## 2016-09-03 DIAGNOSIS — R05 Cough: Secondary | ICD-10-CM

## 2016-09-03 DIAGNOSIS — K2990 Gastroduodenitis, unspecified, without bleeding: Secondary | ICD-10-CM

## 2016-09-03 DIAGNOSIS — K2970 Gastritis, unspecified, without bleeding: Secondary | ICD-10-CM

## 2016-09-03 DIAGNOSIS — R059 Cough, unspecified: Secondary | ICD-10-CM

## 2016-09-03 DIAGNOSIS — K219 Gastro-esophageal reflux disease without esophagitis: Secondary | ICD-10-CM

## 2016-09-03 MED ORDER — RANITIDINE HCL 150 MG PO CAPS: 150.0000 mg | ORAL_CAPSULE | Freq: Two times a day (BID) | ORAL | 3 refills | Status: DC

## 2016-09-03 MED ORDER — RANITIDINE HCL 150 MG PO TABS: ORAL_TABLET | ORAL | 0 refills | Status: DC

## 2016-09-03 NOTE — Telephone Encounter (Signed)
Pts mother states pt has been having a lot of pulmonary issues/sleep studies and will schedule an appointment when she can. Needs refill on zantac. Script sent to pharmacy for pt.

## 2016-09-06 ENCOUNTER — Ambulatory Visit (HOSPITAL_BASED_OUTPATIENT_CLINIC_OR_DEPARTMENT_OTHER): Payer: Medicare Other | Attending: Internal Medicine | Admitting: Internal Medicine

## 2016-09-06 VITALS — Ht 59.5 in | Wt 189.0 lb

## 2016-09-06 DIAGNOSIS — G4733 Obstructive sleep apnea (adult) (pediatric): Secondary | ICD-10-CM | POA: Diagnosis not present

## 2016-09-06 DIAGNOSIS — I472 Ventricular tachycardia: Secondary | ICD-10-CM | POA: Insufficient documentation

## 2016-09-06 DIAGNOSIS — F518 Other sleep disorders not due to a substance or known physiological condition: Secondary | ICD-10-CM

## 2016-09-06 DIAGNOSIS — G4730 Sleep apnea, unspecified: Secondary | ICD-10-CM

## 2016-09-10 ENCOUNTER — Telehealth: Payer: Self-pay | Admitting: Allergy and Immunology

## 2016-09-10 NOTE — Telephone Encounter (Signed)
Spoke with mom. She states that Lori Miles has had the sleep study performed. She has an appointment with you for 09-19-16. She also said something about Jeslynn being given the mask and the "water thing" for the machine, but she needs a prescription for the rest of it. I am not sure what she means. She did say that you could call her with any questions if needed.

## 2016-09-10 NOTE — Telephone Encounter (Signed)
Mom needs to talk with the nurse [phone].

## 2016-09-11 ENCOUNTER — Telehealth (INDEPENDENT_AMBULATORY_CARE_PROVIDER_SITE_OTHER): Payer: Self-pay | Admitting: Family

## 2016-09-11 NOTE — Telephone Encounter (Signed)
°  Who's calling (name and relationship to patient) : [name] (mom) Best contact number: [phone] Provider they see: Blane OharaGoodpasture  Reason for call: Called would not leave a message.  Wants to talk with Elveria Risingina Goodpasture    PRESCRIPTION REFILL ONLY  Name of prescription:  Pharmacy:

## 2016-09-11 NOTE — Telephone Encounter (Signed)
I have no experience with ordering or managing a CPAP machine.  We are going to have to reach out to the adult pulmonary physicians for this.  I don't think that [facility] will see this patient.  Perhaps because she is an adult, they will be able to refuse.

## 2016-09-11 NOTE — Telephone Encounter (Signed)
I called and talked to Mom, Lori Miles. She said that Lori Miles has been diagnosed with sleep apnea and has the mask and tubing but not the CPAP machine. She said that her PCP wants Lori Miles or Lori Miles to manage ordering the CPAP. I told Mom that we haven't seen Lori Miles since September 2017 and she said that she has an appointment with Lori Miles next week and will talk with him about that when she is seen there. I also scheduled Lori Miles for a follow up in this office on July 16th. I will consult with Lori Miles at the time of her visit.   I also talked with Mom about the guardianship papers issue and explained why we must have a copy on file. Mom said that she had brought them in before and doesn't know where the papers are now. I asked her to please look for them at home and told her that I would see if I can find the in the scanned documents in Epic. Mom agreed with this plan. TG

## 2016-09-11 NOTE — Telephone Encounter (Signed)
Call to [name]- asked if she has a copy of the guardianship papers reports yes- requested she bring them to her next appt. So a copy can be made- she does not want to bring them reports should already have a copy of them. RN attempted to explain reason.  She wants to speak with [name] about her dx. Of sleep apnea. She had one test that was neg. And a repeat that was positive for sleep apnea. They gave her a mask to use. Adv will send message to [name] and she will call her back .

## 2016-09-12 NOTE — Telephone Encounter (Signed)
I called Mom and told her that I had searched through the chart but that I was unable to find the guardianship papers. I asked her to please bring them to me at her next appointment in July and she agreed to do so. TG

## 2016-09-15 DIAGNOSIS — G4733 Obstructive sleep apnea (adult) (pediatric): Secondary | ICD-10-CM

## 2016-09-15 NOTE — Procedures (Signed)
  Patient Name: Lori Miles, [name] Study Date: 09/06/2016 Gender: Female D.O.B: 10/22/1995 Age (years): 20 Referring Provider: Fleet ContrasEdwin Avbuere Height (inches): 60 Interpreting Physician: [name] MD, ABSM Weight (lbs): 185 RPSGT: [name], [name] BMI: 37 MRN: [id number] Neck Size: 15.00 CLINICAL INFORMATION The patient is referred for a CPAP titration to treat sleep apnea.  Date of NPSG, Split Night or HST: Diagnostic NPSG 03/27/16   AHI 0.1/ hr, desaturation to a nadir of 90%, body weight 188 lbs  SLEEP STUDY TECHNIQUE As per the AASM Manual for the Scoring of Sleep and Associated Events v2.3 (April 2016) with a hypopnea requiring 4% desaturations.  The channels recorded and monitored were frontal, central and occipital EEG, electrooculogram (EOG), submentalis EMG (chin), nasal and oral airflow, thoracic and abdominal wall motion, anterior tibialis EMG, snore microphone, electrocardiogram, and pulse oximetry. Continuous positive airway pressure (CPAP) was initiated at the beginning of the study and titrated to treat sleep-disordered breathing.  MEDICATIONS Medications self-administered by patient taken the night of the study : none reported  TECHNICIAN COMMENTS Comments added by technician: NO RESTROOM VISTED  Comments added by scorer: N/A  RESPIRATORY PARAMETERS Optimal PAP Pressure (cm): 10 AHI at Optimal Pressure (/hr): 0.7 Overall Minimal O2 (%): 87.00 Supine % at Optimal Pressure (%): 14 Minimal O2 at Optimal Pressure (%): 87.0    SLEEP ARCHITECTURE The study was initiated at 8:59:06 PM and ended at 3:11:23 AM.  Sleep onset time was 0.4 minutes and the sleep efficiency was 98.8%. The total sleep time was 367.8 minutes.  The patient spent 0.27% of the night in stage N1 sleep, 50.39% in stage N2 sleep, 30.45% in stage N3 and 18.89% in REM.Stage REM latency was 84.5 minutes  Wake after sleep onset was 4.0. Alpha intrusion was absent. Supine sleep was 74.26%.  CARDIAC  DATA The 2 lead EKG demonstrated sinus rhythm. The mean heart rate was 94.35 beats per minute. Other EKG findings include: Ventricular Tachycardia.  LEG MOVEMENT DATA The total Periodic Limb Movements of Sleep (PLMS) were 0. The PLMS index was 0.00. A PLMS index of <15 is considered normal in adults.  IMPRESSIONS The optimal PAP pressure was 10 cm of water. Central sleep apnea was not noted during this titration (CAI = 0.0/h). Moderate oxygen desaturations were observed during this titration (min O2 = 87.00%). No snoring was audible during this study. 2-lead EKG demonstrated: Ventricular Tachycardia Clinically significant periodic limb movements were not noted during this study. Arousals associated with PLMs were rare.  DIAGNOSIS Obstructive Sleep Apnea (327.23 [G47.33 ICD-10])  RECOMMENDATIONS Trial of CPAP therapy on 10 cm H2O with a Small size Resmed Full Face Mask AirFit F20 mask and heated humidification. Anticipate additional documentation would be needed because the baseline diagnostic NPSG available to us from 03/27/16 did not show clinically significant OSA.  Be careful with alcohol, sedatives and other CNS depressants that may worsen sleep apnea and disrupt normal sleep architecture. Sleep hygiene should be reviewed to assess factors that may improve sleep quality. Weight management and regular exercise should be initiated or continued.   [Electronically signed] 09/15/2016 11:33 AM  Jetty Duhamellinton Coreon Simkins MD, ABSM Diplomate, American Board of Sleep Medicine   NPI: [id number]  Waymon BudgeYOUNG,Nihar Klus D Diplomate, American Board of Sleep Medicine  ELECTRONICALLY SIGNED ON:  09/15/2016, 11:23 AM [facility] PH: [phone]   FX: [phone] ACCREDITED BY THE AMERICAN ACADEMY OF SLEEP MEDICINE

## 2016-09-17 ENCOUNTER — Ambulatory Visit (INDEPENDENT_AMBULATORY_CARE_PROVIDER_SITE_OTHER): Payer: Medicare Other | Admitting: Allergy and Immunology

## 2016-09-17 ENCOUNTER — Encounter: Payer: Self-pay | Admitting: Allergy and Immunology

## 2016-09-17 VITALS — BP 102/68 | HR 72 | Resp 20

## 2016-09-17 DIAGNOSIS — G4730 Sleep apnea, unspecified: Secondary | ICD-10-CM

## 2016-09-17 DIAGNOSIS — J4540 Moderate persistent asthma, uncomplicated: Secondary | ICD-10-CM | POA: Diagnosis not present

## 2016-09-17 DIAGNOSIS — J3089 Other allergic rhinitis: Secondary | ICD-10-CM | POA: Diagnosis not present

## 2016-09-17 DIAGNOSIS — K219 Gastro-esophageal reflux disease without esophagitis: Secondary | ICD-10-CM

## 2016-09-17 DIAGNOSIS — B999 Unspecified infectious disease: Secondary | ICD-10-CM | POA: Diagnosis not present

## 2016-09-17 MED ORDER — DEXLANSOPRAZOLE 30 MG PO CPDR: 30.0000 mg | DELAYED_RELEASE_CAPSULE | ORAL | 5 refills | Status: DC

## 2016-09-17 NOTE — Telephone Encounter (Signed)
Mom Hulen Shoutsquanetta called and said that she had searched her home and was unable to find the guardianship papers. She had tried to contact her case manager for help but she is on vacation. Mom was very upset and asked if she was not going to be allowed to be seen in this office. I told Mom that she could bring Lori Miles in for her appointment and that we could continue to work on locating the guardianship papers. TG

## 2016-09-17 NOTE — Progress Notes (Signed)
Follow-up Note  Referring Provider: Fleet Contras, MD Primary Provider: Fleet Contras, MD Date of Office Visit: 09/17/2016  Subjective:   Lori Miles (DOB: 04-Jun-1995) is a 21 y.o. female [facility] in re-evaluation of the following:  HPI: Rennae returns to this clinic in evaluation of her asthma and allergic rhinoconjunctivitis and history of reflux-induced respiratory disease and history of recurrent infections. Her last visit to this clinic was 08/06/2016. During that visit we did immunize her with Pneumovax as she did not demonstrate any pneumococcal specific antibodies on previous evaluation. As well, we made a recommendation that she get a DPT injection as she did not have any tetanus antibodies.  Overall she is done relatively well. Most of her cough and sputum production has resolved. She does not appear to have much issue with her nose. She is not using her bronchodilator greater than a few times per week. There apparently is no issues with reflux but she still has these choking episodes that sometimes appear to occur at nighttime for which her mom will give her albuterol which she is not really sure will help.  Apparently she had a CPAP titration study in investigation of possible sleep apnea. Her mom arrives today with tubing and a facemask and a reservoir but no machine.  She did not receive the tetanus booster yet.  Allergies as of 09/17/2016      Reactions   Abilify [aripiprazole] Swelling, Palpitations   Seroquel [quetiapine Fumarate] Palpitations   Doxycycline Other (See Comments)   Stomach cramps   Amoxicillin-pot Clavulanate Hives   Keppra [levetiracetam] Other (See Comments)   Aggressive behavior       Medication List      albuterol (2.5 MG/3ML) 0.083% nebulizer solution Commonly known as:  PROVENTIL Inhale the contents of one vial in nebulizer every four to six hours as needed for cough or wheeze.     benzonatate 100 MG capsule Commonly known as:  TESSALON Take 2 capsules (200 mg total) by mouth 2 (two) times daily as needed for cough.   budesonide 0.5 MG/2ML nebulizer solution Commonly known as:  PULMICORT Take 2 mLs (0.5 mg total) by nebulization 2 (two) times daily. One vial twice daily. May increase to 4 times daily during asthma flares.   budesonide 1 MG/2ML nebulizer solution Commonly known as:  PULMICORT Take 2 mLs (1 mg total) by nebulization 2 (two) times daily.   cetirizine 10 MG tablet Commonly known as:  ZYRTEC Take one tablet daily for runny nose or itching.   cloNIDine 0.1 MG tablet Commonly known as:  CATAPRES take 1 and 1/2 tablets by mouth at bedtime   Cranberry 500 MG Caps Take 500 mg by mouth daily. Reported on 09/07/2015   DEPAKOTE 500 MG DR tablet Generic drug:  divalproex take 1 tablet by mouth twice a day   fluconazole 200 MG tablet Commonly known as:  DIFLUCAN take 1 tablet by mouth once daily may repeat in 48 hours to 72 hours   formoterol 20 MCG/2ML nebulizer solution Commonly known as:  PERFOROMIST Take 2 mLs (20 mcg total) by nebulization 2 (two) times daily.   hyoscyamine 0.125 MG SL tablet Commonly known as:  LEVSIN SL DISSOLVE 1 TABLET UNDER THE TONGUE 2 TIMES A DAY   Levonorgestrel-Ethinyl Estradiol 0.15-0.03 &0.01 MG tablet Commonly known as:  SEASONIQUE Take 1 tablet by mouth daily. Take 1 active tablet daily, skip non active pills.   Melatonin 3  MG Tabs Take 3 mg by mouth at bedtime. For insomnia   montelukast 10 MG tablet Commonly known as:  SINGULAIR Take one tablet each evening to prevent cough or wheeze.   Olopatadine HCl 0.6 % Soln Use one spray in each nostril twice a day.   ondansetron 4 MG tablet Commonly known as:  ZOFRAN Take 1 tablet (4 mg total) by mouth every 6 (six) hours.   ranitidine 150 MG capsule Commonly known as:  ZANTAC Take 1 capsule (150 mg total) by mouth 2 (two) times daily.   ranitidine 150  MG tablet Commonly known as:  ZANTAC take 1 tablet by mouth twice a day MORNING AND EVENING   senna 8.6 MG tablet Commonly known as:  SENOKOT Take 1 tablet by mouth daily.   torsemide 10 MG tablet Commonly known as:  DEMADEX Take 15 mg by mouth daily.   traZODone 50 MG tablet Commonly known as:  DESYREL take 2 tablets by mouth at bedtime       Past Medical History:  Diagnosis Date  . Allergy   . Asthma   . Autism    mom states no actual dx, but admits she has some symptoms  . Bronchitis   . Constipation   . Frequency-urgency syndrome     some incontinence ; wears pull-ups  . GERD (gastroesophageal reflux disease)   . Interstitial cystitis   . Mental deficiency    hx of aggressive behavior,impaired speech   . Pneumonia   . PONV (postoperative nausea and vomiting)   . Seizure (HCC)    most recent sz " at the end of summer"-last sz years ago per mother  . Sleep apnea    needing to order CPAP    Past Surgical History:  Procedure Laterality Date  . CYSTOSCOPY    . CYSTOSCOPY WITH HYDRODISTENSION AND BIOPSY  04/14/2012   Procedure: CYSTOSCOPY/BIOPSY/HYDRODISTENSION;  Surgeon: Lindaann Slough, MD;  Location: [facility] ;  Service: Urology;;  instillation of marcaine and pyridium  . TONSILLECTOMY      Review of systems negative except as noted in HPI / PMHx or noted below:  Review of Systems  Constitutional: Negative.   HENT: Negative.   Eyes: Negative.   Respiratory: Negative.   Cardiovascular: Negative.   Gastrointestinal: Negative.   Genitourinary: Negative.   Musculoskeletal: Negative.   Skin: Negative.   Neurological: Negative.   Endo/Heme/Allergies: Negative.   Psychiatric/Behavioral: Negative.      Objective:   Vitals:   09/17/16 1716  BP: 102/68  Pulse: 72  Resp: 20          Physical Exam  Constitutional: She is well-developed, well-nourished, and in no distress.  HENT:  Head: Normocephalic.  Right Ear: Tympanic membrane,  external ear and ear canal normal.  Left Ear: Tympanic membrane, external ear and ear canal normal.  Nose: Nose normal. No mucosal edema or rhinorrhea.  Mouth/Throat: Uvula is midline, oropharynx is clear and moist and mucous membranes are normal. No oropharyngeal exudate.  Eyes: Conjunctivae are normal.  Neck: Trachea normal. No tracheal tenderness present. No tracheal deviation present. No thyromegaly present.  Cardiovascular: Normal rate, regular rhythm, S1 normal, S2 normal and normal heart sounds.   No murmur heard. Pulmonary/Chest: Breath sounds normal. No stridor. No respiratory distress. She has no wheezes. She has no rales.  Musculoskeletal: She exhibits no edema.  Lymphadenopathy:       Head (right side): No tonsillar adenopathy present.       Head (left  side): No tonsillar adenopathy present.    She has no cervical adenopathy.  Neurological: She is alert. Gait normal.  Skin: No rash noted. She is not diaphoretic. No erythema. Nails show no clubbing.  Psychiatric: Mood and affect normal.    Diagnostics: FEV1 was 1.81 which was 80% of predicted.  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  A sleep study obtained 06 September 2016 had the following results:  The optimal PAP pressure was 10 cm of water. Central sleep apnea was not noted during this titration (CAI = 0.0/h). Moderate oxygen desaturations were observed during this titration (min O2 = 87.00%). No snoring was audible during this study. 2-lead EKG demonstrated: Ventricular Tachycardia Clinically significant periodic limb movements were not noted during this study. Arousals associated with PLMs were rare.  A sleep study obtained 03/27/2016 of the following results:  - No significant obstructive sleep apnea occurred during this study (AHI = 0.1/h). - No significant central sleep apnea occurred during this study (CAI = 0.0/h). - The patient had minimal  or no oxygen desaturation during the study (Min O2 = 90.00%) - The patient snored with Moderate snoring volume. - No cardiac abnormalities were noted during this study. - Clinically significant periodic limb movements did not occur during sleep. No significant associated arousals.   Assessment and Plan:   1. Asthma, moderate persistent, well-controlled   2. Other allergic rhinitis   3. Recurrent infections   4. Gastroesophageal reflux disease, esophagitis presence not specified   5. Sleep apnea, unspecified type     1. Continue a combination of the following:   A. budesonide 1 mg nebulized twice a day  B. Perforomist nebulized twice a day  2. Continue a combination of the following:   A. Nasonex or Flonase one spray each nostril twice a day  B. Patanase 1 spray each nostril twice a day   3. Continue montelukast 10 mg daily  4. Continue cetirizine 10 mg daily  5. Treat reflux with a combination of the following:   A. Dexilant 30 mg  - one tablet in morning (P.A.)  B. ranitidine 150 mg - 2 tablets in evening  C. No caffeine or chocolate consumption   6. Continue albuterol nebulization every 4-6 hours if needed  7. "Action plan" for asthma flare up:   A. increase budesonide to 4 times a day  B. continue Perforomist twice a day  C. use albuterol if needed  8. Have pediatric neurologist or sleep pulmonologist write prescription for CPAP machine  9. Obtain a tetanus booster  10. Return in 3 months or earlier if problem  There are still a few outstanding issues that need to be addressed concerning Micah's health. She needs to obtain a tetanus booster which she will obtain at her primary care doctor's office. She needs to have further evaluation of possible sleep apnea. I do not really understand why a CPAP titration study was performed and her original sleep study did not identify any significant sleep apnea. It is quite possible that she is having reflux events at  nighttime that are disturbing her sleep and we will aggressively treat her for reflux as noted above. She has had problems with proton pump inhibitors in the past including omeprazole and pantoprazole and Nexium and we'll try Dexilant. She will continue to use anti-inflammatory medications for her respiratory tract and I will see her back in this clinic in 3 months or earlier if  there is a problem.  Allena Katz, MD Allergy / Immunology [place]

## 2016-09-17 NOTE — Patient Instructions (Addendum)
  1. Continue a combination of the following:   A. budesonide 1 mg nebulized twice a day  B. Perforomist nebulized twice a day  2. Continue a combination of the following:   A. Nasonex or Flonase one spray each nostril twice a day  B. Patanase 1 spray each nostril twice a day   3. Continue montelukast 10 mg daily  4. Continue cetirizine 10 mg daily  5. Treat reflux with a combination of the following:   A. Dexilant 30 mg  - one tablet in morning (P.A.)  B. ranitidine 150 mg - 2 tablets in evening  C. No caffeine or chocolate consumption   6. Continue albuterol nebulization every 4-6 hours if needed  7. "Action plan" for asthma flare up:   A. increase budesonide to 4 times a day  B. continue Perforomist twice a day  C. use albuterol if needed  8. Have pediatric neurologist or sleep pulmonologist write prescription for CPAP machine  9. Obtain a tetanus booster  10. Return in 3 months or earlier if problem

## 2016-09-19 ENCOUNTER — Telehealth: Payer: Self-pay | Admitting: *Deleted

## 2016-09-19 NOTE — Telephone Encounter (Signed)
-----   Message from Jessica PriestEric J Kozlow, MD sent at 09/19/2016  7:44 AM EDT ----- Please make Lori Miles a appointment to see [facility] Pul[facility] to address her possible sleep apnea. Please inform Mom about visit date.

## 2016-09-19 NOTE — Telephone Encounter (Signed)
Patient has MCD, will send a request for her to be seen by a Peds Pulmonologist. I contacted [facility] and I was told they do not have a [facility] and also patients 21 and over will have to go to a regular Pulmonologist. I will let the PCP decide where to refer the patient due to her having MCD and MCR.  Thanks

## 2016-09-25 ENCOUNTER — Telehealth: Payer: Self-pay | Admitting: *Deleted

## 2016-09-25 NOTE — Telephone Encounter (Signed)
Patient's mother is requesting refills on patient's zantac twice daily. I have advised that patient needs an appointment. Appointment has been scheduled for 12/03/16 @ 9:45 am. There is already an rx on file at pharmacy per pharmacist. Patient cannot pick up rx until 10/04/16.

## 2016-09-30 ENCOUNTER — Ambulatory Visit (INDEPENDENT_AMBULATORY_CARE_PROVIDER_SITE_OTHER): Payer: Medicare Other | Admitting: Family

## 2016-09-30 ENCOUNTER — Encounter (INDEPENDENT_AMBULATORY_CARE_PROVIDER_SITE_OTHER): Payer: Self-pay | Admitting: Family

## 2016-09-30 VITALS — BP 110/70 | HR 86 | Ht <= 58 in | Wt 188.0 lb

## 2016-09-30 DIAGNOSIS — F819 Developmental disorder of scholastic skills, unspecified: Secondary | ICD-10-CM

## 2016-09-30 DIAGNOSIS — E669 Obesity, unspecified: Secondary | ICD-10-CM

## 2016-09-30 DIAGNOSIS — G478 Other sleep disorders: Secondary | ICD-10-CM | POA: Diagnosis not present

## 2016-09-30 DIAGNOSIS — G479 Sleep disorder, unspecified: Secondary | ICD-10-CM | POA: Diagnosis not present

## 2016-09-30 DIAGNOSIS — G4719 Other hypersomnia: Secondary | ICD-10-CM | POA: Diagnosis not present

## 2016-09-30 DIAGNOSIS — G4733 Obstructive sleep apnea (adult) (pediatric): Secondary | ICD-10-CM | POA: Insufficient documentation

## 2016-09-30 DIAGNOSIS — F840 Autistic disorder: Secondary | ICD-10-CM | POA: Diagnosis not present

## 2016-09-30 NOTE — Progress Notes (Signed)
Patient: Lori Miles MRN: [id number] Sex: female DOB: 01-26-1996  Provider: Elveria Rising, NP Location of Care:  Child [facility]rology  Note type: Routine return visit  History of Present Illness: Referral Source: Fleet Contras, MD History from: mother Chief Complaint: follow up on seizures and sleep issues needs CPAP machine per mom  Lori Miles is a 21 y.o. 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  She was last seen March 05, 2016. Lori Miles had a sleep study in October 2016 that did not show evidence of Obstructive Sleep Apnea. She had follow up sleep study in January 2017 that also did not show sleep apnea but she had a CPAP titration on September 06, 2016 and a trial of CPAP was was performed. Mom says that Lori Miles did well with this and that she was more awake and alert the following day. Because of this Mom wants CPAP ordered for the patient. Mom says that Lori Miles sleeps during the day each day, and continues to have ongoing nighttime arousals with coughing and choking, and is quite convinced that CPAP will help her to sleep better. Lori Miles has been to see Lori Miles and is being aggressively treated for GERD. Mom says that Lori Miles has remained seizure free and been doing fairly well otherwise. Mom has no other health concerns for her today other than previously mentioned.  Review of Systems: Please see the HPI for neurologic and other pertinent review of systems. Otherwise, the following systems are noncontributory including constitutional, eyes, ears, nose and throat, cardiovascular, respiratory, gastrointestinal, genitourinary, musculoskeletal, skin, endocrine, hematologic/lymph, allergic/immunologic and psychiatric.   Past Medical History:  Diagnosis Date  . Allergy   . Asthma   . Autism    mom states no actual dx, but admits she has some symptoms  .  Bronchitis   . Constipation   . Frequency-urgency syndrome     some incontinence ; wears pull-ups  . GERD (gastroesophageal reflux disease)   . Interstitial cystitis   . Mental deficiency    hx of aggressive behavior,impaired speech   . Pneumonia   . PONV (postoperative nausea and vomiting)   . Seizure (HCC)    most recent sz " at the end of summer"-last sz years ago per mother  . Sleep apnea    needing to order CPAP   Hospitalizations: No., Head Injury: No., Nervous System Infections: No., Immunizations up to date: Yes.   Past Medical History Comments: EEG on September 14, 2011, was normal. She was able to be successfully weaned off Keppra. The patient has significant issues with aggression, which was thought to be related to Keppra, but has persisted once it was discontinued. The patient was treated with Depakote both for behavior and possible seizures. She had palpitations and severe constipation on Seroquel. She had marked weight gain on Abilify and Depakote. When generic Divalproex was tried she had breakthrough seizures.   Nocturnal polysomnogram on October 27, 2011, failed to show significant periodic limb movements, cyanosis, sleep apnea, or cardiac arrhythmia. EEG on February 20, 2012, was a normal study with the patient awake.    Surgical History Past Surgical History:  Procedure Laterality Date  . CYSTOSCOPY    . CYSTOSCOPY WITH HYDRODISTENSION AND BIOPSY  04/14/2012   Procedure: CYSTOSCOPY/BIOPSY/HYDRODISTENSION;  Surgeon: Lindaann Slough, MD;  Location: [facility];  Service: Urology;;  instillation of marcaine and pyridium  . TONSILLECTOMY      Family  History family history includes Pancreatic cancer in her sister; Seizures in her mother and sister. Family History is otherwise negative for migraines, seizures, cognitive impairment, blindness, deafness, birth defects, chromosomal disorder, autism.  Social History Social History   Social History  .  Marital status: Single    Spouse name: N/A  . Number of children: N/A  . Years of education: N/A   Social History Main Topics  . Smoking status: Never Smoker  . Smokeless tobacco: Never Used  . Alcohol use No  . Drug use: No  . Sexual activity: No   Other Topics Concern  . None   Social History Narrative   Lives with mom Research scientist (physical sciences)) and half-sister Lori Miles) who is also mentally impaired.  Has CAP assistance, urinary incontinence, needs help with feeding,dressing, toileting   Graduated from [employer].  She enjoys playing with cars, dancing, and dogs.    Allergies Allergies  Allergen Reactions  . Abilify [Aripiprazole] Swelling and Palpitations  . Seroquel [Quetiapine Fumarate] Palpitations  . Doxycycline Other (See Comments)    Stomach cramps  . Amoxicillin-Pot Clavulanate Hives  . Keppra [Levetiracetam] Other (See Comments)    Aggressive behavior     Physical Exam BP 110/70   Pulse 86   Ht 4\' 9"  (1.448 m)   Wt 188 lb (85.3 kg)   BMI 40.68 kg/m  General: alert, well developed, obese with short stature, in no acute distress, black hair, brown eyes, right handed  Head: normocephalic, no dysmorphic features  Ears, Nose and Throat: Otoscopic: Tympanic membranes normal. Pharynx: oropharynx is pink without exudates or tonsillar hypertrophy.  Neck:supple, full range of motion, no cranial or cervical bruits  Respiratory:auscultation clear  Cardiovascular:no murmurs, pulses are normal  Musculoskeletal:no skeletal deformities or apparent scoliosis  Skin:no rashes or neurocutaneous lesions  Neurologic Exam  Mental Status: Slept through much of the visit, but was able to be awakened and became alert; she is able to follow some commands. She is able to name objects. She is impulsive.  Cranial Nerves: visual fields are full to double simultaneous stimuli; extraocular movements are full and conjugate; pupils are round reactive to light;  funduscopic examination shows sharp disc margins with normal vessels; symmetric facial strength; midline tongue and uvula; air conduction is greater than bone conduction bilaterally.  Motor: Normal strength, tone, and mass; good fine motor movements; no pronator drift.  Sensory: intact responses to touch and temperature Coordination: good finger-to-nose, rapid repetitive alternating movements and finger apposition  Gait and Station: Gait is broad-based slightly unsteady; balance is poor; Romberg exam is negative; Gower response is negative but difficult Reflexes: symmetric and diminished bilaterally; no clonus; bilateral flexor plantar responses.  Impression 1. 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 3. Insomnia, G47.00. 4. Mild intellectual disability, F70. 5. Sleep arousals 6. Excessive daytime sleepiness  Recommendations for plan of care The [facility] were [facility]. Lori Miles has neither had nor required imaging or lab studies since the last visit. 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 Lori Miles continues to have nightly episodes of awakening during sleep with coughing, choking and gasping. In addition, she is excessively sleepy during the day, and tends to sleep most of the day. Lori Miles has had polysomnogram as well as CPAP titration studies. The polysomnograms have not proven obstructive sleep apnea but a trial of CPAP during the  CPAP titration study showed benefit to Lori Miles. Mom said that she was told that this would suffice for CPAP to be ordered so I will order the CPAP treatment for her. I asked Lori Miles to return for follow up in 3 months or sooner if needed. Mom agreed with these plans.   The medication list was reviewed and reconciled.  No changes were made in the prescribed  medications today.  A complete medication list was provided to the patient's mother.  Allergies as of 09/30/2016      Reactions   Abilify [aripiprazole] Swelling, Palpitations   Seroquel [quetiapine Fumarate] Palpitations   Doxycycline Other (See Comments)   Stomach cramps   Amoxicillin-pot Clavulanate Hives   Keppra [levetiracetam] Other (See Comments)   Aggressive behavior       Medication List       Accurate as of 09/30/16 11:59 PM. Always use your most recent med list.          albuterol (2.5 MG/3ML) 0.083% nebulizer solution Commonly known as:  PROVENTIL Inhale the contents of one vial in nebulizer every four to six hours as needed for cough or wheeze.   benzonatate 100 MG capsule Commonly known as:  TESSALON Take 2 capsules (200 mg total) by mouth 2 (two) times daily as needed for cough.   budesonide 1 MG/2ML nebulizer solution Commonly known as:  PULMICORT Take 2 mLs (1 mg total) by nebulization 2 (two) times daily.   PULMICORT 0.5 MG/2ML nebulizer solution Generic drug:  budesonide inhale contents of 1 vial in nebulizer twice a day MAY INCREASE T...  (REFER TO PRESCRIPTION NOTES).   cetirizine 10 MG tablet Commonly known as:  ZYRTEC Take one tablet daily for runny nose or itching.   cloNIDine 0.1 MG tablet Commonly known as:  CATAPRES take 1 and 1/2 tablets by mouth at bedtime   Cranberry 500 MG Caps Take 500 mg by mouth daily. Reported on 09/07/2015   DEPAKOTE 500 MG Lori tablet Generic drug:  divalproex take 1 tablet by mouth twice a day   Dexlansoprazole 30 MG capsule Commonly known as:  DEXILANT Take 1 capsule (30 mg total) by mouth every morning.   formoterol 20 MCG/2ML nebulizer solution Commonly known as:  PERFOROMIST Take 2 mLs (20 mcg total) by nebulization 2 (two) times daily.   hyoscyamine 0.125 MG SL tablet Commonly known as:  LEVSIN SL DISSOLVE 1 TABLET UNDER THE TONGUE 2 TIMES A DAY   Levonorgestrel-Ethinyl Estradiol 0.15-0.03 &0.01 MG  tablet Commonly known as:  SEASONIQUE Take 1 tablet by mouth daily. Take 1 active tablet daily, skip non active pills.   Melatonin 3 MG Tabs Take 3 mg by mouth at bedtime. For insomnia   montelukast 10 MG tablet Commonly known as:  SINGULAIR Take one tablet each evening to prevent cough or wheeze.   Olopatadine HCl 0.6 % Soln Use one spray in each nostril twice a day.   ondansetron 4 MG tablet Commonly known as:  ZOFRAN Take 1 tablet (4 mg total) by mouth every 6 (six) hours.   ranitidine 150 MG capsule Commonly known as:  ZANTAC Take 1 capsule (150 mg total) by mouth 2 (two) times daily.   senna 8.6 MG tablet Commonly known as:  SENOKOT Take 1 tablet by mouth daily.   torsemide 10 MG tablet Commonly known as:  DEMADEX Take 15 mg by mouth daily.   traZODone 50 MG tablet Commonly known as:  DESYREL take 2 tablets by mouth at bedtime  Lori. Sharene SkeansHickling was consulted and came in to see the patient.  Total time spent with the patient was 30 minutes, of which 50% or more was spent in counseling and coordination of care.   Elveria Risingina Darriel Sinquefield NP-C

## 2016-10-01 NOTE — Patient Instructions (Signed)
I will send CPAP order to [facility].   Please plan to return for follow up in 3 months or sooner if needed.

## 2016-10-02 ENCOUNTER — Other Ambulatory Visit: Payer: Self-pay | Admitting: Internal Medicine

## 2016-10-02 ENCOUNTER — Telehealth (INDEPENDENT_AMBULATORY_CARE_PROVIDER_SITE_OTHER): Payer: Self-pay | Admitting: Family

## 2016-10-02 DIAGNOSIS — R059 Cough, unspecified: Secondary | ICD-10-CM

## 2016-10-02 DIAGNOSIS — K219 Gastro-esophageal reflux disease without esophagitis: Secondary | ICD-10-CM

## 2016-10-02 DIAGNOSIS — K2970 Gastritis, unspecified, without bleeding: Secondary | ICD-10-CM

## 2016-10-02 DIAGNOSIS — R05 Cough: Secondary | ICD-10-CM

## 2016-10-02 DIAGNOSIS — K2990 Gastroduodenitis, unspecified, without bleeding: Secondary | ICD-10-CM

## 2016-10-02 NOTE — Telephone Encounter (Signed)
I called Mom [name] and told her that the order for the CPAP machine was rejected by [facility] because it would not meet insurance guidelines. She said that her caseworker told her that she knew how to get it approved and that she would have the caseworker contact me. I told her that I would be happy to talk with the caseworker. TG

## 2016-10-11 NOTE — Telephone Encounter (Signed)
I attempted to call Lori Miles several other times but received messages that her voicemail was full. I attempted to call Lori Miles's mother back and let her know but received a message that the call could not be completed at this time. I will call again on Monday. TG

## 2016-10-11 NOTE — Telephone Encounter (Signed)
-  Mom called me about the CPAP machine and said that she was told by Ms Jarold Mottoatterson, her community guide, that insurance would pay with a letter of medical necessity. She gave me Ms Patterson's phone number of [phone] and I attempted to call her to get more information but did not receive an answer at that number. I will try again later. TG

## 2016-10-14 NOTE — Telephone Encounter (Signed)
I called [name] and let her know that I was unable to reach Lori Miles on Friday July 27th or today. I asked her to ask Lori. [name] to call me. She agreed to do so. TG

## 2016-10-24 DIAGNOSIS — N3020 Other chronic cystitis without hematuria: Secondary | ICD-10-CM | POA: Diagnosis not present

## 2016-10-28 ENCOUNTER — Encounter (HOSPITAL_COMMUNITY): Payer: Self-pay | Admitting: Emergency Medicine

## 2016-10-28 ENCOUNTER — Emergency Department (HOSPITAL_COMMUNITY)
Admission: EM | Admit: 2016-10-28 | Discharge: 2016-10-28 | Disposition: A | Discharge status: Home / Self Care | Payer: Medicare Other | Attending: Emergency Medicine | Admitting: Emergency Medicine

## 2016-10-28 ENCOUNTER — Emergency Department (HOSPITAL_COMMUNITY): Payer: Medicare Other

## 2016-10-28 DIAGNOSIS — J4521 Mild intermittent asthma with (acute) exacerbation: Secondary | ICD-10-CM | POA: Diagnosis not present

## 2016-10-28 DIAGNOSIS — F840 Autistic disorder: Secondary | ICD-10-CM | POA: Insufficient documentation

## 2016-10-28 DIAGNOSIS — J4520 Mild intermittent asthma, uncomplicated: Secondary | ICD-10-CM | POA: Diagnosis not present

## 2016-10-28 DIAGNOSIS — Z79899 Other long term (current) drug therapy: Secondary | ICD-10-CM | POA: Insufficient documentation

## 2016-10-28 DIAGNOSIS — F71 Moderate intellectual disabilities: Secondary | ICD-10-CM | POA: Insufficient documentation

## 2016-10-28 DIAGNOSIS — R05 Cough: Secondary | ICD-10-CM | POA: Insufficient documentation

## 2016-10-28 DIAGNOSIS — R000 Tachycardia, unspecified: Secondary | ICD-10-CM | POA: Diagnosis not present

## 2016-10-28 DIAGNOSIS — R0602 Shortness of breath: Secondary | ICD-10-CM | POA: Diagnosis not present

## 2016-10-28 MED ORDER — ALBUTEROL SULFATE (2.5 MG/3ML) 0.083% IN NEBU
5.0000 mg | INHALATION_SOLUTION | Freq: Once | RESPIRATORY_TRACT | Status: AC
  Administered 2016-10-28: 5 mg via RESPIRATORY_TRACT
  Filled 2016-10-28: qty 6

## 2016-10-28 MED ORDER — AZITHROMYCIN 250 MG PO TABS: ORAL_TABLET | ORAL | 0 refills | Status: DC

## 2016-10-28 MED ORDER — PREDNISONE 20 MG PO TABS: 60.0000 mg | ORAL_TABLET | Freq: Every day | ORAL | 0 refills | Status: AC

## 2016-10-28 MED ORDER — PREDNISONE 20 MG PO TABS
60.0000 mg | ORAL_TABLET | Freq: Once | ORAL | Status: AC
  Administered 2016-10-28: 60 mg via ORAL
  Filled 2016-10-28: qty 3

## 2016-10-28 NOTE — ED Provider Notes (Signed)
[facility]-EMERGENCY DEPT Provider Note   CSN: [id number] Arrival date & time: 10/28/16  0536     History   Chief Complaint Chief Complaint  Lori Miles presents with  . Shortness of Breath    HPI Lori Miles is a 21 y.o. female.  HPI 21 y.o. female with a hx of Asthma, Autism, presents to the Emergency Department today due to cough since Friday. No fevers. No N/V/D. Notes cough worsening last night. Attempted albuterol treatments with minimal relief. Lori Miles mother worried for PNA due to hx same. Notes cough with productive sputum. No N/V/D. No CP/ABD pain. No rhinorrhea. No congestion. No earaches. No pain currently. No other symptoms noted.    Past Medical History:  Diagnosis Date  . Allergy   . Asthma   . Autism    mom states no actual dx, but admits she has some symptoms  . Bronchitis   . Constipation   . Frequency-urgency syndrome     some incontinence ; wears pull-ups  . GERD (gastroesophageal reflux disease)   . Interstitial cystitis   . Mental deficiency    hx of aggressive behavior,impaired speech   . Pneumonia   . PONV (postoperative nausea and vomiting)   . Seizure (HCC)    most recent sz " at the end of summer"-last sz years ago per mother  . Sleep apnea    needing to order CPAP    Lori Miles Active Problem List   Diagnosis Date Noted  . Obstructive sleep apnea 09/30/2016  . Encounter for gynecological examination with Papanicolaou smear of cervix 03/12/2016  . Excessive daytime sleepiness 03/05/2016  . Insomnia 10/04/2015  . Acute bronchitis 05/05/2015  . Dyspnea 05/05/2015  . Gait disorder 03/22/2015  . 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 Cough variant asthma vs UACS/ vcd  03/15/2015  . CAP (community acquired pneumonia) 02/14/2015  . Autism spectrum disorder 02/14/2015  . Acute respiratory failure (HCC) 02/14/2015  . Nausea & vomiting 02/14/2015  . Disturbance in sleep behavior 12/13/2014  . Obesity, morbid (HCC) 12/13/2014  . Mental retardation,  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) 12/13/2014  . 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 Acanthosis nigricans 11/14/2014  . Toeing-in 08/29/2014  . Obesity (BMI 30-39.9) 08/23/2013  . 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 Interstitial cystitis 04/28/2012  . Recurrent urinary tract infection 08/01/2011  . Asthma 08/01/2011  . Chronic constipation 08/01/2011  . Contraception 08/01/2011    Past Surgical History:  Procedure Laterality Date  . CYSTOSCOPY    . CYSTOSCOPY WITH HYDRODISTENSION AND BIOPSY  04/14/2012   Procedure: CYSTOSCOPY/BIOPSY/HYDRODISTENSION;  Surgeon: Lindaann SloughMarc-Henry Nesi, MD;  [facility];  Service: Urology;;  instillation of marcaine and pyridium  . TONSILLECTOMY      OB History    No data available       Home Medications    Prior to Admission medications   Medication Sig Start Date End Date Taking? Authorizing Provider  albuterol (PROVENTIL) (2.5 MG/3ML) 0.083% nebulizer solution Inhale the contents of one vial in nebulizer every four to six hours as needed for cough or wheeze. 04/23/16   Kozlow, Alvira PhilipsEric J, MD  benzonatate (TESSALON) 100 MG capsule Take 2 capsules (200 mg total) by mouth 2 (two) times daily as needed for cough. 02/26/16   Breuer HenleNadeau, Nicole Elizabeth, PA-C  budesonide (PULMICORT) 1 MG/2ML nebulizer solution  Take 2 mLs (1 mg total) by nebulization 2 (two) times daily. 07/23/16   Kozlow, Alvira Philips, MD  cetirizine (ZYRTEC) 10 MG tablet Take one tablet daily for runny nose or itching. Lori Miles taking differently: Take 10 mg by mouth daily.  06/20/16   Kozlow, Alvira Philips, MD  cloNIDine (CATAPRES) 0.1 MG tablet take 1 and 1/2 tablets by mouth at bedtime 07/17/16   Elveria Rising, NP  Cranberry 500 MG CAPS Take 500 mg by mouth daily. Reported on  09/07/2015    [provider]  DEPAKOTE 500 MG DR tablet take 1 tablet by mouth twice a day 04/29/16   Elveria Rising, NP  Dexlansoprazole (DEXILANT) 30 MG capsule Take 1 capsule (30 mg total) by mouth every morning. 09/17/16   Kozlow, Alvira Philips, MD  formoterol (PERFOROMIST) 20 MCG/2ML nebulizer solution Take 2 mLs (20 mcg total) by nebulization 2 (two) times daily. 04/25/16   Kozlow, Alvira Philips, MD  hyoscyamine (LEVSIN SL) 0.125 MG SL tablet DISSOLVE 1 TABLET UNDER THE TONGUE 2 TIMES A DAY 08/08/14   [provider]  Levonorgestrel-Ethinyl Estradiol (SEASONIQUE) 0.15-0.03 &0.01 MG tablet Take 1 tablet by mouth daily. Take 1 active tablet daily, skip non active pills. 02/13/16   Orvilla Cornwall A, CNM  Melatonin 3 MG TABS Take 3 mg by mouth at bedtime. For insomnia    [provider]  montelukast (SINGULAIR) 10 MG tablet Take one tablet each evening to prevent cough or wheeze. 08/23/16   Kozlow, Alvira Philips, MD  Olopatadine HCl 0.6 % SOLN Use one spray in each nostril twice a day. 07/23/16   Kozlow, Alvira Philips, MD  ondansetron (ZOFRAN) 4 MG tablet Take 1 tablet (4 mg total) by mouth every 6 (six) hours. 12/31/15   Tharon Aquas, PA  PULMICORT 0.5 MG/2ML nebulizer solution inhale contents of 1 vial in nebulizer twice a day MAY INCREASE T...  (REFER TO PRESCRIPTION NOTES). 07/23/16   [provider]  ranitidine (ZANTAC) 150 MG capsule Take 1 capsule (150 mg total) by mouth 2 (two) times daily. 09/03/16   Pyrtle, Carie Caddy, MD  senna (SENOKOT) 8.6 MG tablet Take 1 tablet by mouth daily.    [provider]  torsemide (DEMADEX) 10 MG tablet Take 15 mg by mouth daily.     [provider]  traZODone (DESYREL) 50 MG tablet take 2 tablets by mouth at bedtime 04/29/16   Elveria Rising, NP    Family History Family History  Problem Relation Age of Onset  . Seizures Mother   . Seizures Sister        1 sister has  . Pancreatic cancer Sister   . Colon cancer Neg Hx   . Colon  polyps Neg Hx   . Esophageal cancer Neg Hx   . Rectal cancer Neg Hx   . Stomach cancer Neg Hx     Social History Social History  Substance Use Topics  . Smoking status: Never Smoker  . Smokeless tobacco: Never Used  . Alcohol use No     Allergies   Abilify [aripiprazole]; Seroquel [quetiapine fumarate]; Doxycycline; Amoxicillin-pot clavulanate; and Keppra [levetiracetam]   Review of Systems Review of Systems ROS reviewed and all are negative for acute change except as noted in the HPI.  Physical Exam Updated Vital Signs BP 108/70 (BP Location: Right Arm)   Pulse (!) 117   Temp 98.4 F (36.9 C) (Oral)   Resp 20   SpO2 99%   Physical Exam  Constitutional: She  is oriented to person, place, and time. She appears well-developed and well-nourished. No distress.  HENT:  Head: Normocephalic and atraumatic.  Right Ear: Tympanic membrane, external ear and ear canal normal.  Left Ear: Tympanic membrane, external ear and ear canal normal.  Nose: Nose normal.  Mouth/Throat: Uvula is midline, oropharynx is clear and moist and mucous membranes are normal. No trismus in the jaw. No oropharyngeal exudate, posterior oropharyngeal erythema or tonsillar abscesses.  Eyes: Pupils are equal, round, and reactive to light. EOM are normal.  Neck: Normal range of motion. Neck supple. No tracheal deviation present.  Cardiovascular: Normal rate, regular rhythm, S1 normal, S2 normal, normal heart sounds, intact distal pulses and normal pulses.   Pulmonary/Chest: Effort normal. No respiratory distress. She has no decreased breath sounds. She has wheezes in the right upper field, the right lower field, the left upper field and the left lower field. She has no rhonchi. She has no rales.  Abdominal: Normal appearance and bowel sounds are normal. There is no tenderness.  Musculoskeletal: Normal range of motion.  Neurological: She is alert and oriented to person, place, and time.  Skin: Skin is warm and  dry.  Psychiatric: She has a normal mood and affect. Her speech is normal and behavior is normal. Thought content normal.  Nursing note and vitals reviewed.    ED Treatments / Results  Labs (all labs ordered are listed, but only abnormal results are displayed) Labs Reviewed - No data to display  EKG  EKG Interpretation  Date/Time:  Monday October 28 2016 05:50:28 EDT Ventricular Rate:  109 PR Interval:    QRS Duration: 80 QT Interval:  320 QTC Calculation: 431 R Axis:   68 Text Interpretation:  Sinus tachycardia Borderline T wave abnormalities Baseline wander in lead(s) II III aVL aVF No significant change was found Confirmed by Glynn Octave [phone]) on 10/28/2016 6:06:54 AM       Radiology Dg Chest 2 View  Result Date: 10/28/2016 CLINICAL DATA:  Acute onset of shortness of breath, cough and wheezing. Initial encounter. EXAM: CHEST  2 VIEW COMPARISON:  Chest radiograph performed 07/18/2016 FINDINGS: The lungs are well-aerated and clear. There is no evidence of focal opacification, pleural effusion or pneumothorax. The heart is normal in size; the mediastinal contour is within normal limits. No acute osseous abnormalities are seen. IMPRESSION: No acute cardiopulmonary process seen. Electronically Signed   By: Roanna Raider M.D.   On: 10/28/2016 06:08    Procedures Procedures (including critical care time)  Medications Ordered in ED Medications  predniSONE (DELTASONE) tablet 60 mg (not administered)  albuterol (PROVENTIL) (2.5 MG/3ML) 0.083% nebulizer solution 5 mg (5 mg Nebulization Given 10/28/16 6045)     Initial Impression / Assessment and Plan / ED Course  I have reviewed the triage vital signs and the nursing notes.  Pertinent labs & imaging results that were available during my care of the Lori Miles were reviewed by me and considered in my medical decision making (see chart for details).  Final Clinical Impressions(s) / ED Diagnoses   {I have reviewed and evaluated  the relevant imaging studies. {I have interpreted the relevant EKG. {I have reviewed the relevant previous healthcare records.  {I obtained HPI from historian.   ED Course:  Assessment: Lori Miles is a 21 y.o. female with hx Asthma who presents with Asthma exacerbation x 2-3  days. No fevers. On exam, Lori Miles in NAD. Nontoxic/nonseptic appearing. VSS. Afebrile. Lungs with bilateral wheeze. Heart RRR. Abdomen nontender soft. CXR unremarkable.  Given albuterol, steroids in ED with improvement. Lori Miles mother requesting ABX. Discussed results of CXR with mother, but mother adamant for coverage as Lori Miles always develops PNA with these symptoms. Due to sinus congestion and productive sputum, will Rx ABX. Discussed risks and benefits of ABX with parents and overuse and Lori Miles agree. Plan is to DC Home with follow up to PCP. Given Rx Steroids and Albuterol. At time of discharge, Lori Miles is in no acute distress. Vital Signs are stable. Lori Miles is able to ambulate. Lori Miles able to tolerate PO.   Disposition/Plan:  DC Home Additional Verbal discharge instructions given and discussed with Lori Miles.  Lori Miles Instructed to f/u with PCP in the next week for evaluation and treatment of symptoms. Return precautions given Lori Miles acknowledges and agrees with plan  Supervising Physician Rancour, Jeannett Senior, MD  Final diagnoses:  Mild intermittent asthma with exacerbation    New Prescriptions New Prescriptions   No medications on file     Audry Pili, Cordelia Poche 10/28/16 4098    Glynn Octave, MD 10/28/16 [phone]

## 2016-10-28 NOTE — Discharge Instructions (Signed)
Please read and follow all provided instructions.  Your diagnoses today include:  1. Mild intermittent asthma with exacerbation     Tests performed today include: TESTS Vital signs. See below for your results today.   Medications prescribed:   Take any prescribed medications only as directed.  Home care instructions:  Follow any educational materials contained in this packet.  Follow-up instructions: Please follow-up with your primary care provider in the next 3 days for further evaluation of your symptoms and management of your asthma.  Return instructions:  Please return to the Emergency Department if you experience worsening symptoms. Please return with worsening wheezing, shortness of breath, or difficulty breathing. Return with persistent fever above 101F.  Please return if you have any other emergent concerns.  Additional Information:  Your vital signs today were: BP 108/70 (BP Location: Right Arm)    Pulse (!) 117    Temp 98.4 F (36.9 C) (Oral)    Resp 20    SpO2 99%  If your blood pressure (BP) was elevated above 135/85 this visit, please have this repeated by your doctor within one month. --------------

## 2016-10-28 NOTE — ED Triage Notes (Signed)
Patient with shortness of breath, cough and mucous production.  Mom states that this has been going on since getting home last night.  Mom states she was wheezing and giving her her asthma meds.

## 2016-10-28 NOTE — ED Notes (Signed)
Pt departed in NAD.  

## 2016-10-30 ENCOUNTER — Other Ambulatory Visit (INDEPENDENT_AMBULATORY_CARE_PROVIDER_SITE_OTHER): Payer: Self-pay | Admitting: Family

## 2016-10-30 DIAGNOSIS — Z87898 Personal history of other specified conditions: Secondary | ICD-10-CM | POA: Diagnosis not present

## 2016-10-30 DIAGNOSIS — K5900 Constipation, unspecified: Secondary | ICD-10-CM | POA: Diagnosis not present

## 2016-10-30 DIAGNOSIS — G40309 Generalized idiopathic epilepsy and epileptic syndromes, not intractable, without status epilepticus: Secondary | ICD-10-CM

## 2016-10-30 DIAGNOSIS — R32 Unspecified urinary incontinence: Secondary | ICD-10-CM | POA: Diagnosis not present

## 2016-10-30 DIAGNOSIS — N390 Urinary tract infection, site not specified: Secondary | ICD-10-CM | POA: Diagnosis not present

## 2016-10-30 DIAGNOSIS — G40209 Localization-related (focal) (partial) symptomatic epilepsy and epileptic syndromes with complex partial seizures, not intractable, without status epilepticus: Secondary | ICD-10-CM

## 2016-10-30 DIAGNOSIS — N3281 Overactive bladder: Secondary | ICD-10-CM | POA: Diagnosis not present

## 2016-10-30 MED ORDER — DEPAKOTE 500 MG PO TBEC: 500.0000 mg | DELAYED_RELEASE_TABLET | Freq: Two times a day (BID) | ORAL | 5 refills | Status: DC

## 2016-10-31 ENCOUNTER — Encounter (HOSPITAL_COMMUNITY): Payer: Self-pay | Admitting: Emergency Medicine

## 2016-10-31 ENCOUNTER — Emergency Department (HOSPITAL_COMMUNITY): Payer: Medicare Other

## 2016-10-31 ENCOUNTER — Telehealth: Payer: Self-pay | Admitting: Allergy and Immunology

## 2016-10-31 DIAGNOSIS — J45901 Unspecified asthma with (acute) exacerbation: Secondary | ICD-10-CM | POA: Insufficient documentation

## 2016-10-31 DIAGNOSIS — Z79899 Other long term (current) drug therapy: Secondary | ICD-10-CM | POA: Insufficient documentation

## 2016-10-31 DIAGNOSIS — Z7983 Long term (current) use of bisphosphonates: Secondary | ICD-10-CM | POA: Insufficient documentation

## 2016-10-31 DIAGNOSIS — R062 Wheezing: Secondary | ICD-10-CM | POA: Diagnosis present

## 2016-10-31 DIAGNOSIS — R0602 Shortness of breath: Secondary | ICD-10-CM | POA: Diagnosis not present

## 2016-10-31 NOTE — Telephone Encounter (Signed)
I called patient's mother back today. [facility] but no fever noted in ED note. She also [facility] and now. She said that it was 100 something this morning when she checked it and she was unsure of temp now because she was unable to find the thermometer. I asked about the asthma action plan so far that they had been following. She told me that she had just started giving her the albuterol nebulizer today because she had gotten worse. Mom [facility] up bright yellow mucus as well. She did start having some runny nose this morning, but no color to it. Mom did want me to know [facility] because insurance would not cover it. Mom advised me after I offered an appointment that she had already planned on taking her to the emergency room.

## 2016-10-31 NOTE — Telephone Encounter (Signed)
[place] [facility] [facility] [place] [facility] [place]

## 2016-10-31 NOTE — ED Triage Notes (Signed)
[name] reports fever, shortness of breath and cough since prior visit this week. Seen by PCP and ED MD, already on antibiotics and prednisone. Reports throat pain. Mother states pt isn't any better. No wheezing in triage, no fever in triage.

## 2016-10-31 NOTE — Telephone Encounter (Signed)
Pt mom called and wants to talk with a nurse about her running a high fever and wheezing and went to [facility] room on monday  And on a anib. [phone]

## 2016-11-01 ENCOUNTER — Emergency Department (HOSPITAL_COMMUNITY)
Admission: EM | Admit: 2016-11-01 | Discharge: 2016-11-01 | Disposition: A | Discharge status: Home / Self Care | Payer: Medicare Other | Attending: Emergency Medicine | Admitting: Emergency Medicine

## 2016-11-01 DIAGNOSIS — J45901 Unspecified asthma with (acute) exacerbation: Secondary | ICD-10-CM

## 2016-11-01 MED ORDER — LEVOFLOXACIN 750 MG PO TABS: 750.0000 mg | ORAL_TABLET | Freq: Every day | ORAL | 0 refills | Status: DC

## 2016-11-01 MED ORDER — IPRATROPIUM-ALBUTEROL 0.5-2.5 (3) MG/3ML IN SOLN
3.0000 mL | Freq: Once | RESPIRATORY_TRACT | Status: AC
  Administered 2016-11-01: 3 mL via RESPIRATORY_TRACT
  Filled 2016-11-01: qty 3

## 2016-11-01 MED ORDER — DEXAMETHASONE SODIUM PHOSPHATE 10 MG/ML IJ SOLN
10.0000 mg | Freq: Once | INTRAMUSCULAR | Status: AC
  Administered 2016-11-01: 10 mg via INTRAMUSCULAR
  Filled 2016-11-01: qty 1

## 2016-11-01 NOTE — ED Provider Notes (Signed)
[facility]-EMERGENCY DEPT Provider Note   CSN: [id number] Arrival date & time: 10/31/16  2146     History   Chief Complaint Chief Complaint  Patient presents with  . Asthma    HPI Lori Miles is a 21 y.o. female.  Patient presents to the emergency department with chief complaint of wheezing. She has company by her mother, who states she has been wheezing for the past several days. She was seen recently and prescribed prednisone and azithromycin, and was rapidly improving, but her symptoms came back yesterday and have progressively worsened throughout the day today. She reports fever to 100. Mother is concerned about pneumonia. Patient also complains of sore throat and runny nose.   The history is provided by the patient. No language interpreter was used.    Past Medical History:  Diagnosis Date  . Allergy   . Asthma   . Autism    mom states no actual dx, but admits she has some symptoms  . Bronchitis   . Constipation   . Frequency-urgency syndrome     some incontinence ; wears pull-ups  . GERD (gastroesophageal reflux disease)   . Interstitial cystitis   . Mental deficiency    hx of aggressive behavior,impaired speech   . Pneumonia   . PONV (postoperative nausea and vomiting)   . Seizure (HCC)    most recent sz " at the end of summer"-last sz years ago per mother  . Sleep apnea    needing to order CPAP    Patient Active Problem List   Diagnosis Date Noted  . Obstructive sleep apnea 09/30/2016  . Encounter for gynecological examination with Papanicolaou smear of cervix 03/12/2016  . Excessive daytime sleepiness 03/05/2016  . Insomnia 10/04/2015  . Acute bronchitis 05/05/2015  . Dyspnea 05/05/2015  . Gait disorder 03/22/2015  . 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 Cough variant asthma vs UACS/ vcd  03/15/2015  . CAP (community acquired pneumonia) 02/14/2015  . Autism spectrum disorder 02/14/2015  . Acute respiratory failure (HCC) 02/14/2015  . Nausea &  vomiting 02/14/2015  . Disturbance in sleep behavior 12/13/2014  . Obesity, morbid (HCC) 12/13/2014  . 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 Acanthosis nigricans 11/14/2014  . Toeing-in 08/29/2014  . Obesity (BMI 30-39.9) 08/23/2013  . 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 Interstitial cystitis 04/28/2012  . Recurrent urinary tract infection 08/01/2011  . Asthma 08/01/2011  . Chronic constipation 08/01/2011  . Contraception 08/01/2011    Past Surgical History:  Procedure Laterality Date  . CYSTOSCOPY    . CYSTOSCOPY WITH HYDRODISTENSION AND BIOPSY  04/14/2012   Procedure: CYSTOSCOPY/BIOPSY/HYDRODISTENSION;  Surgeon: Lindaann Slough, MD;  Location: [facility];  Service: Urology;;  instillation of marcaine and pyridium  . TONSILLECTOMY      OB History    No data available       Home Medications    Prior to Admission medications   Medication Sig Start Date End Date Taking? Authorizing Provider  albuterol (PROVENTIL) (2.5 MG/3ML) 0.083% nebulizer solution Inhale the contents of one vial in nebulizer every four to six hours as needed for cough or wheeze. 04/23/16   Kozlow, Alvira Philips, MD  azithromycin (ZITHROMAX Z-PAK) 250 MG tablet 500mg  PO once daily on day 1. THEN 250mg  PO once  daily for 4 days 10/28/16   Audry Pili, PA-C  benzonatate (TESSALON) 100 MG capsule Take 2 capsules (200 mg total) by mouth 2 (two) times daily as needed for cough. 02/26/16   Gignac Henle, PA-C  budesonide (PULMICORT) 1 MG/2ML nebulizer solution Take 2 mLs (1 mg total) by nebulization 2 (two) times daily. 07/23/16   Kozlow, Alvira Philips, MD  cetirizine (ZYRTEC) 10 MG tablet Take one tablet daily for runny nose or  itching. Patient taking differently: Take 10 mg by mouth daily.  06/20/16   Kozlow, Alvira Philips, MD  cloNIDine (CATAPRES) 0.1 MG tablet take 1 and 1/2 tablets by mouth at bedtime 07/17/16   Elveria Rising, NP  Cranberry 500 MG CAPS Take 500 mg by mouth daily. Reported on 09/07/2015    [provider]  DEPAKOTE 500 MG DR tablet Take 1 tablet (500 mg total) by mouth 2 (two) times daily. 10/30/16   Elveria Rising, NP  Dexlansoprazole (DEXILANT) 30 MG capsule Take 1 capsule (30 mg total) by mouth every morning. 09/17/16   Kozlow, Alvira Philips, MD  formoterol (PERFOROMIST) 20 MCG/2ML nebulizer solution Take 2 mLs (20 mcg total) by nebulization 2 (two) times daily. 04/25/16   Kozlow, Alvira Philips, MD  hyoscyamine (LEVSIN SL) 0.125 MG SL tablet DISSOLVE 1 TABLET UNDER THE TONGUE 2 TIMES A DAY 08/08/14   [provider]  Levonorgestrel-Ethinyl Estradiol (SEASONIQUE) 0.15-0.03 &0.01 MG tablet Take 1 tablet by mouth daily. Take 1 active tablet daily, skip non active pills. 02/13/16   Orvilla Cornwall A, CNM  Melatonin 3 MG TABS Take 3 mg by mouth at bedtime. For insomnia    [provider]  montelukast (SINGULAIR) 10 MG tablet Take one tablet each evening to prevent cough or wheeze. 08/23/16   Kozlow, Alvira Philips, MD  Olopatadine HCl 0.6 % SOLN Use one spray in each nostril twice a day. 07/23/16   Kozlow, Alvira Philips, MD  ondansetron (ZOFRAN) 4 MG tablet Take 1 tablet (4 mg total) by mouth every 6 (six) hours. 12/31/15   Tharon Aquas, PA  predniSONE (DELTASONE) 20 MG tablet Take 3 tablets (60 mg total) by mouth daily. 10/28/16 11/02/16  Audry Pili, PA-C  PULMICORT 0.5 MG/2ML nebulizer solution inhale contents of 1 vial in nebulizer twice a day MAY INCREASE T...  (REFER TO PRESCRIPTION NOTES). 07/23/16   [provider]  ranitidine (ZANTAC) 150 MG capsule Take 1 capsule (150 mg total) by mouth 2 (two) times daily. 09/03/16   Pyrtle, Carie Caddy, MD  senna (SENOKOT) 8.6 MG tablet Take 1 tablet by mouth daily.     [provider]  torsemide (DEMADEX) 10 MG tablet Take 15 mg by mouth daily.     [provider]  traZODone (DESYREL) 50 MG tablet take 2 tablets by mouth at bedtime 04/29/16   Elveria Rising, NP    Family History Family History  Problem Relation Age of Onset  . Seizures Mother   . Seizures Sister        1 sister has  . Pancreatic cancer Sister   . Colon cancer Neg Hx   . Colon polyps Neg Hx   . Esophageal cancer Neg Hx   . Rectal cancer Neg Hx   . Stomach cancer Neg Hx     Social History Social History  Substance Use Topics  . Smoking status: Never Smoker  . Smokeless tobacco: Never Used  . Alcohol use No     Allergies   Abilify [aripiprazole]; Seroquel [  quetiapine fumarate]; Doxycycline; Amoxicillin-pot clavulanate; and Keppra [levetiracetam]   Review of Systems Review of Systems  All other systems reviewed and are negative.    Physical Exam Updated Vital Signs BP (!) 116/103 (BP Location: Left Arm)   Pulse 93   Temp 98.6 F (37 C) (Oral)   Resp 16   SpO2 97%   Physical Exam  Constitutional: She is oriented to person, place, and time. She appears well-developed and well-nourished.  HENT:  Head: Normocephalic and atraumatic.  Eyes: Pupils are equal, round, and reactive to light. Conjunctivae and EOM are normal.  Neck: Normal range of motion. Neck supple.  Cardiovascular: Normal rate and regular rhythm.  Exam reveals no gallop and no friction rub.   No murmur heard. Pulmonary/Chest: Effort normal and breath sounds normal. No respiratory distress. She has no wheezes. She has no rales. She exhibits no tenderness.  CTAB  Abdominal: Soft. Bowel sounds are normal. She exhibits no distension and no mass. There is no tenderness. There is no rebound and no guarding.  Musculoskeletal: Normal range of motion. She exhibits no edema or tenderness.  Neurological: She is alert and oriented to person, place, and time.  Skin: Skin is warm and dry.   Psychiatric: She has a normal mood and affect. Her behavior is normal. Judgment and thought content normal.  Nursing note and vitals reviewed.    ED Treatments / Results  Labs (all labs ordered are listed, but only abnormal results are displayed) Labs Reviewed - No data to display  EKG  EKG Interpretation None       Radiology Dg Chest 2 View  Result Date: 10/31/2016 CLINICAL DATA:  Shortness of breath. Asthma. Progressive shortness of breath. EXAM: CHEST  2 VIEW COMPARISON:  Radiographs 10/28/2016 FINDINGS: Low lung volumes bronchovascular crowding. The cardiomediastinal contours are normal. Pulmonary vasculature is normal. No consolidation, pleural effusion, or pneumothorax. No acute osseous abnormalities are seen. IMPRESSION: Low lung volumes with bronchovascular crowding. Electronically Signed   By: Rubye Oaks M.D.   On: 10/31/2016 22:35    Procedures Procedures (including critical care time)  Medications Ordered in ED Medications  ipratropium-albuterol (DUONEB) 0.5-2.5 (3) MG/3ML nebulizer solution 3 mL (not administered)  dexamethasone (DECADRON) injection 10 mg (not administered)     Initial Impression / Assessment and Plan / ED Course  I have reviewed the triage vital signs and the nursing notes.  Pertinent labs & imaging results that were available during my care of the patient were reviewed by me and considered in my medical decision making (see chart for details).     Patient with reported asthma exacerbation. No wheezing heard on exam. Breathing treatment given, patient ambulates maintaining 97% pulse oxygenation. She is well-appearing. She is in no apparent distress. Mother does report initial improvement with azithromycin several days ago, then worsening symptoms over the past day or 2 with associated fever to 100. Patient may benefit from switching to Levaquin, for which the patient has had good response in the past.  Final Clinical Impressions(s) / ED  Diagnoses   Final diagnoses:  Exacerbation of asthma, unspecified asthma severity, unspecified whether persistent    New Prescriptions New Prescriptions   LEVOFLOXACIN (LEVAQUIN) 750 MG TABLET    Take 1 tablet (750 mg total) by mouth daily.     Roxy Horseman, PA-C 11/01/16 0981    Derwood Kaplan, MD 11/04/16 [id number]

## 2016-11-01 NOTE — ED Notes (Signed)
Patient ambulated in hallway 02 saturation remained at 97% while ambulating.

## 2016-11-01 NOTE — ED Notes (Signed)
Pt updated on wait times, father remains upset and sts " I dont want to hear that" when explaining longest wait and pt wait time. I explained that I couldn't lie and continued to give him current longest wait time along with his daughters wait time. Assure we were moving as fast as possible.

## 2016-11-01 NOTE — ED Notes (Signed)
Pt and family upset regarding wait times. We have taken to reevalutae x 1.

## 2016-11-05 ENCOUNTER — Encounter: Payer: Self-pay | Admitting: Allergy and Immunology

## 2016-11-05 ENCOUNTER — Ambulatory Visit (INDEPENDENT_AMBULATORY_CARE_PROVIDER_SITE_OTHER): Payer: Medicare Other | Admitting: Allergy and Immunology

## 2016-11-05 ENCOUNTER — Encounter (HOSPITAL_COMMUNITY): Payer: Self-pay | Admitting: *Deleted

## 2016-11-05 VITALS — BP 108/68 | HR 112 | Temp 98.9°F | Resp 20

## 2016-11-05 DIAGNOSIS — J4540 Moderate persistent asthma, uncomplicated: Secondary | ICD-10-CM | POA: Diagnosis not present

## 2016-11-05 DIAGNOSIS — R062 Wheezing: Secondary | ICD-10-CM | POA: Insufficient documentation

## 2016-11-05 DIAGNOSIS — J3089 Other allergic rhinitis: Secondary | ICD-10-CM

## 2016-11-05 DIAGNOSIS — J0100 Acute maxillary sinusitis, unspecified: Secondary | ICD-10-CM | POA: Insufficient documentation

## 2016-11-05 DIAGNOSIS — K219 Gastro-esophageal reflux disease without esophagitis: Secondary | ICD-10-CM

## 2016-11-05 DIAGNOSIS — Z79899 Other long term (current) drug therapy: Secondary | ICD-10-CM | POA: Diagnosis not present

## 2016-11-05 DIAGNOSIS — B999 Unspecified infectious disease: Secondary | ICD-10-CM | POA: Diagnosis not present

## 2016-11-05 DIAGNOSIS — F840 Autistic disorder: Secondary | ICD-10-CM | POA: Insufficient documentation

## 2016-11-05 DIAGNOSIS — R0981 Nasal congestion: Secondary | ICD-10-CM | POA: Diagnosis present

## 2016-11-05 DIAGNOSIS — R05 Cough: Secondary | ICD-10-CM | POA: Diagnosis not present

## 2016-11-05 LAB — CBC
HEMATOCRIT: 37.7 % (ref 36.0–46.0)
HEMOGLOBIN: 12.7 g/dL (ref 12.0–15.0)
MCH: 30 pg (ref 26.0–34.0)
MCHC: 33.7 g/dL (ref 30.0–36.0)
MCV: 88.9 fL (ref 78.0–100.0)
Platelets: 284 10*3/uL (ref 150–400)
RBC: 4.24 MIL/uL (ref 3.87–5.11)
RDW: 13.6 % (ref 11.5–15.5)
WBC: 19.3 10*3/uL — AB (ref 4.0–10.5)

## 2016-11-05 LAB — COMPREHENSIVE METABOLIC PANEL
ALK PHOS: 49 U/L (ref 38–126)
ALT: 26 U/L (ref 14–54)
AST: 20 U/L (ref 15–41)
Albumin: 3 g/dL — ABNORMAL LOW (ref 3.5–5.0)
Anion gap: 9 (ref 5–15)
BUN: 11 mg/dL (ref 6–20)
CHLORIDE: 99 mmol/L — AB (ref 101–111)
CO2: 31 mmol/L (ref 22–32)
Calcium: 8.4 mg/dL — ABNORMAL LOW (ref 8.9–10.3)
Creatinine, Ser: 1.07 mg/dL — ABNORMAL HIGH (ref 0.44–1.00)
GFR calc Af Amer: >60 mL/min (ref >60)
GFR calc non Af Amer: >60 mL/min (ref >60)
GLUCOSE: 159 mg/dL — AB (ref 65–99)
Potassium: 3.2 mmol/L — ABNORMAL LOW (ref 3.5–5.1)
Sodium: 139 mmol/L (ref 135–145)
Total Bilirubin: 0.2 mg/dL — ABNORMAL LOW (ref 0.3–1.2)
Total Protein: 6.4 g/dL — ABNORMAL LOW (ref 6.5–8.1)

## 2016-11-05 NOTE — Patient Instructions (Addendum)
  1. Continue a combination of the following:   A. budesonide 1 mg nebulized twice a day  B. Perforomist nebulized twice a day  2. Continue a combination of the following:   A. Nasonex or Flonase one spray each nostril twice a day  B. Patanase 1 spray each nostril twice a day   3. Continue montelukast 10 mg daily  4. Continue cetirizine 10 mg daily  5. Treat reflux with a combination of the following:   A. increase ranitidine 300 mg twice a day  B. No caffeine or chocolate consumption  6. Continue albuterol nebulization every 4-6 hours if needed  7. "Action plan" for asthma flare up:   A. increase budesonide to 4 times a day  B. continue Perforomist twice a day  C. use albuterol if needed  8. Have pediatric neurologist write prescription for CPAP machine  9. Obtain a tetanus booster  10. Blood - mannose-binding protein, CH 50, Pneumo - 14  11. Obtain modified barium swallow for possible aspiration  12. Return in 3 months or earlier if problem

## 2016-11-05 NOTE — ED Triage Notes (Addendum)
[name] reports pt has had nasal congestion or sinus congestion x 4 weeks.  Has been to her asthma doctor and has had a CXR to r/o PNA but was negative.  She reports she was given prednisone shot which helped but did not last long.  Pt started to feel the same, was given levaquin abx without relief.  Mom also reports that pt does not know how to blow her nose d/t developmental delay.  Pt is alert.  Does not appear to be in distress.

## 2016-11-05 NOTE — Progress Notes (Signed)
Follow-up Note  Referring Provider: Fleet Contras, MD Primary Provider: Fleet Contras, MD Date of Office Visit: 11/05/2016  Subjective:   Lori Miles (DOB: [birth year]) is a 21 y.o. female [facility] in re-evaluation of the following:  HPI: Lori Miles returns to this clinic in reevaluation of her asthma and allergic rhinoconjunctivitis and reflux-induced respiratory disease. Her last visit to this clinic was July 2018 at which point in time it appeared as though reflux might of been one of the issues giving rise to her recurrent respiratory tract symptoms and possibly even triggering off recurrent infections with microaspiration.  Apparently over the course of the past week or so she has been to the emergency room on 3 occasions with coughing and sputum production and a fever along with nasal congestion and rhinorrhea. Her mom states that her sputum production is yellow. She has gagging and retching. She has apparently had a chest x-ray which is normal and she has been given 2 courses of antibiotics including Levaquin and prednisone. She may be better at this point in time in that she does not have a fever and her coughing is less but she still has some nasal congestion.  She was unable to tolerate the proton pump inhibitor I gave her during her last visit. She continues to use ranitidine which she can tolerate. She has been good about not consuming caffeine.  She is still waiting for the CPAP machine ordered by her neurologist. As confusing as the story is concerning whether or not she truly has sleep apnea there has been an effort to obtain a CPAP machine for her because she apparently sleeps better with the machine and feels more alert the day after using the machine.  She had low levels of anti-pneumococcal antibodies documented in May and she did receive the Pneumovax and she is due for rechecking her anti-pneumococcal antibody titers.  She has also had very low titers of antibodies directed against tetanus but she has not received her tetanus booster yet. It should be noted that her memory B-cell population appears to be normal as assessed earlier this spring.  Allergies as of 11/05/2016      Reactions   Abilify [aripiprazole] Swelling, Palpitations   Seroquel [quetiapine Fumarate] Palpitations   Doxycycline Other (See Comments)   Stomach cramps   Amoxicillin-pot Clavulanate Hives   Keppra [levetiracetam] Other (See Comments)   Aggressive behavior       Medication List      albuterol (2.5 MG/3ML) 0.083% nebulizer solution Commonly known as:  PROVENTIL Inhale the contents of one vial in nebulizer every four to six hours as needed for cough or wheeze.   benzonatate 100 MG capsule Commonly known as:  TESSALON Take 2 capsules (200 mg total) by mouth 2 (two) times daily as needed for cough.   budesonide 1 MG/2ML nebulizer solution Commonly known as:  PULMICORT Take 2 mLs (1 mg total) by nebulization 2 (two) times daily.   PULMICORT 0.5 MG/2ML nebulizer solution Generic drug:  budesonide inhale contents of 1 vial in nebulizer twice a day MAY INCREASE T...  (REFER TO PRESCRIPTION NOTES).   cetirizine 10 MG tablet Commonly known as:  ZYRTEC Take one tablet daily for runny nose or itching.   cloNIDine 0.1 MG tablet Commonly known as:  CATAPRES take 1 and 1/2 tablets by mouth at bedtime   Cranberry 500 MG Caps Take 500 mg by mouth daily. Reported on 09/07/2015  DEPAKOTE 500 MG DR tablet Generic drug:  divalproex Take 1 tablet (500 mg total) by mouth 2 (two) times daily.   Dexlansoprazole 30 MG capsule Commonly known as:  DEXILANT Take 1 capsule (30 mg total) by mouth every morning.   formoterol 20 MCG/2ML nebulizer solution Commonly known as:  PERFOROMIST Take 2 mLs (20 mcg total) by nebulization 2 (two) times daily.   hyoscyamine 0.125 MG SL tablet Commonly known as:  LEVSIN SL DISSOLVE 1 TABLET UNDER  THE TONGUE 2 TIMES A DAY   levofloxacin 750 MG tablet Commonly known as:  LEVAQUIN Take 1 tablet (750 mg total) by mouth daily.   Levonorgestrel-Ethinyl Estradiol 0.15-0.03 &0.01 MG tablet Commonly known as:  SEASONIQUE Take 1 tablet by mouth daily. Take 1 active tablet daily, skip non active pills.   Melatonin 3 MG Tabs Take 3 mg by mouth at bedtime. For insomnia   montelukast 10 MG tablet Commonly known as:  SINGULAIR Take one tablet each evening to prevent cough or wheeze.   Olopatadine HCl 0.6 % Soln Use one spray in each nostril twice a day.   ondansetron 4 MG tablet Commonly known as:  ZOFRAN Take 1 tablet (4 mg total) by mouth every 6 (six) hours.   ranitidine 150 MG capsule Commonly known as:  ZANTAC Take 1 capsule (150 mg total) by mouth 2 (two) times daily.   senna 8.6 MG tablet Commonly known as:  SENOKOT Take 1 tablet by mouth daily.   torsemide 10 MG tablet Commonly known as:  DEMADEX Take 15 mg by mouth daily.   traZODone 50 MG tablet Commonly known as:  DESYREL take 2 tablets by mouth at bedtime       Past Medical History:  Diagnosis Date  . Allergy   . Asthma   . Autism    mom states no actual dx, but admits she has some symptoms  . Bronchitis   . Constipation   . Frequency-urgency syndrome     some incontinence ; wears pull-ups  . GERD (gastroesophageal reflux disease)   . Interstitial cystitis   . Mental deficiency    hx of aggressive behavior,impaired speech   . Pneumonia   . PONV (postoperative nausea and vomiting)   . Seizure (HCC)    most recent sz " at the end of summer"-last sz years ago per mother  . Sleep apnea    needing to order CPAP    Past Surgical History:  Procedure Laterality Date  . CYSTOSCOPY    . CYSTOSCOPY WITH HYDRODISTENSION AND BIOPSY  04/14/2012   Procedure: CYSTOSCOPY/BIOPSY/HYDRODISTENSION;  Surgeon: Lindaann Slough, MD;  Location: [facility];  Service: Urology;;  instillation of marcaine  and pyridium  . TONSILLECTOMY      Review of systems negative except as noted in HPI / PMHx or noted below:  Review of Systems  Constitutional: Negative.   HENT: Negative.   Eyes: Negative.   Respiratory: Negative.   Cardiovascular: Negative.   Gastrointestinal: Negative.   Genitourinary: Negative.   Musculoskeletal: Negative.   Skin: Negative.   Neurological: Negative.   Endo/Heme/Allergies: Negative.   Psychiatric/Behavioral: Negative.      Objective:   Vitals:   11/05/16 1059  BP: 108/68  Pulse: (!) 112  Resp: 20  Temp: 98.9 F (37.2 C)  SpO2: 98%          Physical Exam  Constitutional: She is well-developed, well-nourished, and in no distress.  HENT:  Head: Normocephalic.  Right Ear: External ear  and ear canal normal. Tympanic membrane is erythematous (slight).  Left Ear: External ear and ear canal normal. Tympanic membrane is erythematous (slight).  Nose: Mucosal edema (erythematous) present. No rhinorrhea.  Mouth/Throat: Uvula is midline, oropharynx is clear and moist and mucous membranes are normal. No oropharyngeal exudate.  Eyes: Conjunctivae are normal.  Neck: Trachea normal. No tracheal tenderness present. No tracheal deviation present. No thyromegaly present.  Cardiovascular: Normal rate, regular rhythm, S1 normal, S2 normal and normal heart sounds.   No murmur heard. Pulmonary/Chest: Breath sounds normal. No stridor. No respiratory distress. She has no wheezes. She has no rales.  Musculoskeletal: She exhibits no edema.  Lymphadenopathy:       Head (right side): No tonsillar adenopathy present.       Head (left side): No tonsillar adenopathy present.    She has no cervical adenopathy.  Neurological: She is alert. Gait normal.  Skin: No rash noted. She is not diaphoretic. No erythema. Nails show no clubbing.    Diagnostics: Results of a chest x-ray obtained 10/28/2016 identified the following:  The lungs are well-aerated and clear. There is no  evidence of focal opacification, pleural effusion or pneumothorax.  The heart is normal in size; the mediastinal contour is within normal limits. No acute osseous abnormalities are seen.   Spirometry was performed and demonstrated an FEV1 of 1.44 at 64 % of predicted.  The patient had an Asthma Control Test with the following results: ACT Total Score: 8.    Assessment and Plan:   1. Not well controlled moderate persistent asthma   2. Other allergic rhinitis   3. Gastroesophageal reflux disease, esophagitis presence not specified   4. Recurrent infections     1. Continue a combination of the following:   A. budesonide 1 mg nebulized twice a day  B. Perforomist nebulized twice a day  2. Continue a combination of the following:   A. Nasonex or Flonase one spray each nostril twice a day  B. Patanase 1 spray each nostril twice a day   3. Continue montelukast 10 mg daily  4. Continue cetirizine 10 mg daily  5. Treat reflux with a combination of the following:   A. increase ranitidine 300 mg twice a day  B. No caffeine or chocolate consumption  6. Continue albuterol nebulization every 4-6 hours if needed  7. "Action plan" for asthma flare up:   A. increase budesonide to 4 times a day  B. continue Perforomist twice a day  C. use albuterol if needed  8. Have pediatric neurologist write prescription for CPAP machine  9. Obtain a tetanus booster  10. Blood - mannose-binding protein, CH 50, Pneumo - 14  11. Obtain modified barium swallow for possible aspiration  12. Return in 3 months or earlier if problem  [name] appears to be doing poorly regarding all of her respiratory tract issues. To work through why she continues to have issues with recurrent coughing and sputum production and recurrent fevers I'm going to have her obtain some additional blood tests looking for a possible immunodeficiency and to assess her response to the pneumococcal vaccine. In addition, there does  appear to be a history consistent with reflux-induced respiratory disease and I question whether she is having some microaspiration that may be precipitating some of her febrile respiratory issues and we will obtain a modified barium swallow to investigate this issue. Her last investigation for a swallowing disorder was 18 years ago. As well I will have her use high-dose ranitidine  to address her reflux issue as she is intolerant of a proton pump inhibitor. Concerning the issue with possible sleep apnea she can follow-up with her neurologist concerning further management of this issue. I do not think that she requires any additional antibiotics or systemic steroids at this point in time. She is already had too many antibiotics and systemic steroids over the course of the past year and giving her any additional steroids will just put her at risk of getting immunosuppressed and developing cataract and osteoporosis.  Laurette Schimke, MD Allergy / Immunology [facility]

## 2016-11-06 ENCOUNTER — Emergency Department (HOSPITAL_COMMUNITY)
Admission: EM | Admit: 2016-11-06 | Discharge: 2016-11-06 | Disposition: A | Discharge status: Home / Self Care | Payer: Medicare Other | Attending: Emergency Medicine | Admitting: Emergency Medicine

## 2016-11-06 ENCOUNTER — Emergency Department (HOSPITAL_COMMUNITY): Payer: Medicare Other

## 2016-11-06 DIAGNOSIS — J0100 Acute maxillary sinusitis, unspecified: Secondary | ICD-10-CM | POA: Diagnosis not present

## 2016-11-06 DIAGNOSIS — R062 Wheezing: Secondary | ICD-10-CM

## 2016-11-06 DIAGNOSIS — R05 Cough: Secondary | ICD-10-CM | POA: Diagnosis not present

## 2016-11-06 LAB — URINALYSIS, ROUTINE W REFLEX MICROSCOPIC
BACTERIA UA: NONE SEEN
Glucose, UA: NEGATIVE mg/dL
HGB URINE DIPSTICK: NEGATIVE
KETONES UR: 5 mg/dL — AB
LEUKOCYTES UA: NEGATIVE
Nitrite: NEGATIVE
Protein, ur: 30 mg/dL — AB
SPECIFIC GRAVITY, URINE: 1.032 — AB (ref 1.005–1.030)
pH: 5 (ref 5.0–8.0)

## 2016-11-06 LAB — PREGNANCY, URINE: PREG TEST UR: NEGATIVE

## 2016-11-06 MED ORDER — IPRATROPIUM-ALBUTEROL 0.5-2.5 (3) MG/3ML IN SOLN
RESPIRATORY_TRACT | Status: AC
  Filled 2016-11-06: qty 3

## 2016-11-06 MED ORDER — IPRATROPIUM-ALBUTEROL 0.5-2.5 (3) MG/3ML IN SOLN
3.0000 mL | Freq: Once | RESPIRATORY_TRACT | Status: AC
  Administered 2016-11-06: 3 mL via RESPIRATORY_TRACT

## 2016-11-06 MED ORDER — PREDNISONE 10 MG PO TABS: ORAL_TABLET | ORAL | 0 refills | Status: DC

## 2016-11-06 MED ORDER — PREDNISONE 20 MG PO TABS
60.0000 mg | ORAL_TABLET | Freq: Once | ORAL | Status: AC
  Administered 2016-11-06: 60 mg via ORAL

## 2016-11-06 MED ORDER — PREDNISONE 20 MG PO TABS
ORAL_TABLET | ORAL | Status: AC
  Administered 2016-11-06: 03:00:00
  Filled 2016-11-06: qty 3

## 2016-11-06 NOTE — ED Provider Notes (Signed)
[facility]-EMERGENCY DEPT Provider Note   CSN: [id number] Arrival date & time: 11/05/16  2109     History   Chief Complaint Chief Complaint  Patient presents with  . Nasal Congestion  . Emesis    HPI Lori Miles is a 21 y.o. female.  HPI Lori Miles is a 21 y.o. female with history of autism, asthma, bronchitis, acid reflux, seizures, presents to emergency department complaining of nasal congestion, cough, wheezing, fever for 4 weeks. Patient has been seen multiple times by ER, primary care provider, urgent care, even asthma/allergy specialist. She recently finished Levaquin which she states helped while she was on it. She also received a steroid injection about a week ago for the same. Patient's mother states that once patient was off of the antibiotic her symptoms came back. Mother states the patient sometimes short of breath, constantly wheezing, has productive cough, nasal congestion. She is unable to blow her nose due to her developmental delays. She has tried intranasal saline. She is currently on anti-acid medication. She takes breathing treatments at home every few hours. She states tonight her symptoms seemed to get worse so she brought her to emergency department. Mother also states the patient has intermittent emesis, she is not eating as much, states she has no appetite. She also reports fever up to 101 for several days, last fever documented this evening at home.  Past Medical History:  Diagnosis Date  . Allergy   . Asthma   . Autism    mom states no actual dx, but admits she has some symptoms  . Bronchitis   . Constipation   . Frequency-urgency syndrome     some incontinence ; wears pull-ups  . GERD (gastroesophageal reflux disease)   . Interstitial cystitis   . Mental deficiency    hx of aggressive behavior,impaired speech   . Pneumonia   . PONV (postoperative nausea and vomiting)   . Seizure (HCC)    most recent sz " at the end of summer"-last sz years ago per  mother  . Sleep apnea    needing to order CPAP    Patient Active Problem List   Diagnosis Date Noted  . Obstructive sleep apnea 09/30/2016  . Encounter for gynecological examination with Papanicolaou smear of cervix 03/12/2016  . Excessive daytime sleepiness 03/05/2016  . Insomnia 10/04/2015  . Acute bronchitis 05/05/2015  . Dyspnea 05/05/2015  . Gait disorder 03/22/2015  . 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 Cough variant asthma vs UACS/ vcd  03/15/2015  . CAP (community acquired pneumonia) 02/14/2015  . Autism spectrum disorder 02/14/2015  . Acute respiratory failure (HCC) 02/14/2015  . Nausea & vomiting 02/14/2015  . Disturbance in sleep behavior 12/13/2014  . Obesity, morbid (HCC) 12/13/2014  . 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 Acanthosis nigricans 11/14/2014  . Toeing-in 08/29/2014  . Obesity (BMI 30-39.9) 08/23/2013  . 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 Interstitial cystitis 04/28/2012  . Recurrent urinary tract infection 08/01/2011  . Asthma 08/01/2011  . Chronic constipation 08/01/2011  . Contraception 08/01/2011    Past Surgical History:  Procedure Laterality Date  . CYSTOSCOPY    . CYSTOSCOPY WITH HYDRODISTENSION AND BIOPSY  04/14/2012   Procedure: CYSTOSCOPY/BIOPSY/HYDRODISTENSION;  Surgeon: Lindaann Slough, MD;  Location: [facility];  Service:  Urology;;  instillation of marcaine and pyridium  . TONSILLECTOMY      OB History    No data available       Home Medications    Prior to Admission medications   Medication Sig Start Date End Date Taking? Authorizing Provider  albuterol (PROVENTIL) (2.5 MG/3ML) 0.083% nebulizer solution Inhale the contents of one  vial in nebulizer every four to six hours as needed for cough or wheeze. 04/23/16  Yes Kozlow, Alvira Philips, MD  benzonatate (TESSALON) 100 MG capsule Take 2 capsules (200 mg total) by mouth 2 (two) times daily as needed for cough. 02/26/16  Yes Puskarich Henle, PA-C  budesonide (PULMICORT) 1 MG/2ML nebulizer solution Take 2 mLs (1 mg total) by nebulization 2 (two) times daily. 07/23/16  Yes Kozlow, Alvira Philips, MD  cetirizine (ZYRTEC) 10 MG tablet Take one tablet daily for runny nose or itching. Patient taking differently: Take 10 mg by mouth daily.  06/20/16  Yes Kozlow, Alvira Philips, MD  cloNIDine (CATAPRES) 0.1 MG tablet take 1 and 1/2 tablets by mouth at bedtime 07/17/16  Yes Goodpasture, Inetta Fermo, NP  Cranberry 500 MG CAPS Take 500 mg by mouth daily. Reported on 09/07/2015   Yes [provider]  DEPAKOTE 500 MG DR tablet Take 1 tablet (500 mg total) by mouth 2 (two) times daily. 10/30/16  Yes Elveria Rising, NP  Dexlansoprazole (DEXILANT) 30 MG capsule Take 1 capsule (30 mg total) by mouth every morning. 09/17/16  Yes Kozlow, Alvira Philips, MD  formoterol (PERFOROMIST) 20 MCG/2ML nebulizer solution Take 2 mLs (20 mcg total) by nebulization 2 (two) times daily. 04/25/16  Yes Kozlow, Alvira Philips, MD  hyoscyamine (LEVSIN SL) 0.125 MG SL tablet DISSOLVE 1 TABLET UNDER THE TONGUE 2 TIMES A DAY 08/08/14  Yes [provider]  Levonorgestrel-Ethinyl Estradiol (SEASONIQUE) 0.15-0.03 &0.01 MG tablet Take 1 tablet by mouth daily. Take 1 active tablet daily, skip non active pills. 02/13/16  Yes Denney, Rachelle A, CNM  Melatonin 3 MG TABS Take 3 mg by mouth at bedtime. For insomnia   Yes [provider]  montelukast (SINGULAIR) 10 MG tablet Take one tablet each evening to prevent cough or wheeze. 08/23/16  Yes Kozlow, Alvira Philips, MD  Olopatadine HCl 0.6 % SOLN Use one spray in each nostril twice a day. 07/23/16  Yes Kozlow, Alvira Philips, MD  ondansetron (ZOFRAN) 4 MG tablet Take 1 tablet (4 mg total) by mouth every 6 (six) hours.  12/31/15  Yes Barbra Sarks C, PA  PULMICORT 0.5 MG/2ML nebulizer solution inhale contents of 1 vial in nebulizer twice a day MAY INCREASE T...  (REFER TO PRESCRIPTION NOTES). 07/23/16  Yes [provider]  ranitidine (ZANTAC) 150 MG capsule Take 1 capsule (150 mg total) by mouth 2 (two) times daily. 09/03/16  Yes Pyrtle, Carie Caddy, MD  senna (SENOKOT) 8.6 MG tablet Take 1 tablet by mouth daily.   Yes [provider]  torsemide (DEMADEX) 10 MG tablet Take 15 mg by mouth daily.    Yes [provider]  traZODone (DESYREL) 50 MG tablet take 2 tablets by mouth at bedtime 04/29/16  Yes Goodpasture, Inetta Fermo, NP  levofloxacin (LEVAQUIN) 750 MG tablet Take 1 tablet (750 mg total) by mouth daily. Patient not taking: Reported on 11/06/2016 11/01/16   Roxy Horseman, PA-C    Family History Family History  Problem Relation Age of Onset  . Seizures Mother   . Seizures Sister        1 sister has  .  Pancreatic cancer Sister   . Colon cancer Neg Hx   . Colon polyps Neg Hx   . Esophageal cancer Neg Hx   . Rectal cancer Neg Hx   . Stomach cancer Neg Hx     Social History Social History  Substance Use Topics  . Smoking status: Never Smoker  . Smokeless tobacco: Never Used  . Alcohol use No     Allergies   Abilify [aripiprazole]; Seroquel [quetiapine fumarate]; Doxycycline; Amoxicillin-pot clavulanate; and Keppra [levetiracetam]   Review of Systems Review of Systems  Constitutional: Negative for chills and fever.  HENT: Positive for congestion, postnasal drip, rhinorrhea and sinus pressure.   Respiratory: Positive for cough, shortness of breath and wheezing. Negative for chest tightness.   Cardiovascular: Negative for chest pain, palpitations and leg swelling.  Gastrointestinal: Negative for abdominal pain, diarrhea, nausea and vomiting.  Genitourinary: Negative for dysuria, flank pain and pelvic pain.  Musculoskeletal: Negative for arthralgias, myalgias, neck pain and neck  stiffness.  Skin: Negative for rash.  Neurological: Negative for dizziness, weakness and headaches.  All other systems reviewed and are negative.    Physical Exam Updated Vital Signs BP 128/64 (BP Location: Right Arm)   Pulse (!) 108   Temp (!) 97.4 F (36.3 C) (Oral)   Resp 16   Ht 4' 9.5" (1.461 m)   Wt 83.5 kg (184 lb)   SpO2 96%   BMI 39.13 kg/m   Physical Exam  Constitutional: She appears well-developed and well-nourished. No distress.  HENT:  Head: Normocephalic.  Nose: Mucosal edema and rhinorrhea present.  Mouth/Throat: Uvula is midline, oropharynx is clear and moist and mucous membranes are normal.  Eyes: Conjunctivae are normal.  Neck: Neck supple.  Cardiovascular: Normal rate, regular rhythm and normal heart sounds.   Pulmonary/Chest: Effort normal. No respiratory distress. She has wheezes. She has no rales.  Expiratory wheezes in all lung fields bilaterally.  Abdominal: Soft. Bowel sounds are normal. She exhibits no distension. There is no tenderness. There is no rebound.  Musculoskeletal: She exhibits no edema.  Neurological: She is alert.  Skin: Skin is warm and dry.  Psychiatric: She has a normal mood and affect. Her behavior is normal.  Nursing note and vitals reviewed.    ED Treatments / Results  Labs (all labs ordered are listed, but only abnormal results are displayed) Labs Reviewed  COMPREHENSIVE METABOLIC PANEL - Abnormal; Notable for the following:       Result Value   Potassium 3.2 (*)    Chloride 99 (*)    Glucose, Bld 159 (*)    Creatinine, Ser 1.07 (*)    Calcium 8.4 (*)    Total Protein 6.4 (*)    Albumin 3.0 (*)    Total Bilirubin 0.2 (*)    All other components within normal limits  CBC - Abnormal; Notable for the following:    WBC 19.3 (*)    All other components within normal limits  URINALYSIS, ROUTINE W REFLEX MICROSCOPIC - Abnormal; Notable for the following:    Color, Urine AMBER (*)    APPearance HAZY (*)    Specific  Gravity, Urine 1.032 (*)    Bilirubin Urine SMALL (*)    Ketones, ur 5 (*)    Protein, ur 30 (*)    Squamous Epithelial / LPF 0-5 (*)    All other components within normal limits  PREGNANCY, URINE    EKG  EKG Interpretation None       Radiology No results found.  Procedures Procedures (including critical care time)  Medications Ordered in ED Medications  ipratropium-albuterol (DUONEB) 0.5-2.5 (3) MG/3ML nebulizer solution 3 mL (not administered)  predniSONE (DELTASONE) tablet 60 mg (not administered)     Initial Impression / Assessment and Plan / ED Course  I have reviewed the triage vital signs and the nursing notes.  Pertinent labs & imaging results that were available during my care of the patient were reviewed by me and considered in my medical decision making (see chart for details).     Patient in emergency department with persistent nasal congestion, off, wheezing. Symptoms have been going on for several weeks, worse in the last week. Patient just completed a course of Levaquin just few days ago with no improvement of her symptoms. On exam, mild expiratory wheezing noted. Oxygen saturation is 100% on room air. Patient is afebrile. Patient is congested with nasal rhinorrhea noted. Her oropharynx is normal. Will repeat chest x-ray to rule out pneumonia. Labs obtained   Chest x-ray is negative. Mother is requesting more and a bionics and prednisone. Patient's white blood cell count is elevated, but she recently had IM steroids. She is afebrile and nontoxic appearing here. I discussed with mother that I do not think that she needs any more antibiotics. Her symptoms seem to be a viral or allergic in nature. I will place her on a 5 day burst of steroids. I asked her to follow-up with family doctor for recheck especially if not improving.  Vitals:   11/05/16 2126 11/06/16 0119 11/06/16 0232  BP: 112/77 128/64   Pulse: (!) 109 (!) 108   Resp: 20 16   Temp: (!) 97.4 F  (36.3 C)    TempSrc: Oral    SpO2: 97% 96% 100%  Weight: 83.5 kg (184 lb)    Height: 4' 9.5" (1.461 m)       Final Clinical Impressions(s) / ED Diagnoses   Final diagnoses:  Acute maxillary sinusitis, recurrence not specified  Wheezing    New Prescriptions New Prescriptions   PREDNISONE (DELTASONE) 10 MG TABLET    Take 5 tablets PO daily     Jaynie Crumble, PA-C 11/06/16 5809    Devoria Albe, MD 11/06/16 [phone]

## 2016-11-06 NOTE — Discharge Instructions (Signed)
Continue breathing treatments at home and intranasal saline. Take prednisone as prescribed until all gone. Follow up with family doctor if not improving.

## 2016-11-07 ENCOUNTER — Telehealth: Payer: Self-pay | Admitting: Internal Medicine

## 2016-11-07 ENCOUNTER — Telehealth (INDEPENDENT_AMBULATORY_CARE_PROVIDER_SITE_OTHER): Payer: Self-pay | Admitting: Family

## 2016-11-07 ENCOUNTER — Other Ambulatory Visit: Payer: Self-pay | Admitting: Family

## 2016-11-07 DIAGNOSIS — G4700 Insomnia, unspecified: Secondary | ICD-10-CM

## 2016-11-07 MED ORDER — TRAZODONE HCL 50 MG PO TABS: 100.0000 mg | ORAL_TABLET | Freq: Every day | ORAL | 5 refills | Status: DC

## 2016-11-07 NOTE — Telephone Encounter (Signed)
  Who's calling (name and relationship to patient) : Lonie Peak, mother  Best contact number: [phone]  Provider they see: Elveria Rising, FNP  Reason for call: Mother called in requesting to speak with Inetta Fermo regarding an order/Rx for [facility].  Please call her back at [phone].     PRESCRIPTION REFILL ONLY  Name of prescription:  Pharmacy:

## 2016-11-07 NOTE — Telephone Encounter (Signed)
I called and talked to Mom. She said that Lori Miles's caseworker has found a payor for the CPAP but needs a copy of the referral information that was sent to [facility]. Mom asked to pick that up tomorrow. I told her that it would be at the front desk for her. TG

## 2016-11-07 NOTE — Telephone Encounter (Signed)
Pts mother wanted to know if pt had ever had a barium swallow. Dr. Lucie Leather ordered one in November of 2000. Upper GI series was ordered by Dr. Rhea Belton in April of 2017. She will let Dr. Lucie Leather know and have him call to speak with Dr. Rhea Belton is he has questions.

## 2016-11-11 ENCOUNTER — Other Ambulatory Visit (INDEPENDENT_AMBULATORY_CARE_PROVIDER_SITE_OTHER): Payer: Self-pay | Admitting: Family

## 2016-11-11 DIAGNOSIS — G4733 Obstructive sleep apnea (adult) (pediatric): Secondary | ICD-10-CM

## 2016-11-11 DIAGNOSIS — G4719 Other hypersomnia: Secondary | ICD-10-CM

## 2016-11-11 DIAGNOSIS — F840 Autistic disorder: Secondary | ICD-10-CM

## 2016-11-11 DIAGNOSIS — G478 Other sleep disorders: Secondary | ICD-10-CM

## 2016-11-11 DIAGNOSIS — F819 Developmental disorder of scholastic skills, unspecified: Secondary | ICD-10-CM

## 2016-11-11 DIAGNOSIS — E669 Obesity, unspecified: Secondary | ICD-10-CM

## 2016-11-11 LAB — STREP PNEUMONIAE 14 SEROTYPES IGG
PNEUMO AB TYPE 14: 1.5 ug/mL (ref >1.3)
PNEUMO AB TYPE 3: 2.3 ug/mL (ref >1.3)
PNEUMO AB TYPE 4: 19.4 ug/mL (ref >1.3)
PNEUMO AB TYPE 8: 14.6 ug/mL (ref >1.3)
PNEUMO AB TYPE 9 (9N): 7.6 ug/mL (ref >1.3)
Pneumo Ab Type 1*: >33.8 ug/mL (ref >1.3)
Pneumo Ab Type 12 (12F)*: 0.9 ug/mL — ABNORMAL LOW (ref >1.3)
Pneumo Ab Type 19 (19F)*: 2.9 ug/mL (ref >1.3)
Pneumo Ab Type 23 (23F)*: 1.6 ug/mL (ref >1.3)
Pneumo Ab Type 26 (6B)*: 20.1 ug/mL (ref >1.3)
Pneumo Ab Type 51 (7F)*: 15.4 ug/mL (ref >1.3)
Pneumo Ab Type 56 (18C)*: 0.9 ug/mL — ABNORMAL LOW (ref >1.3)
Pneumo Ab Type 57 (19A)*: 3 ug/mL (ref >1.3)
Pneumo Ab Type 68 (9V)*: 21 ug/mL (ref >1.3)

## 2016-11-11 LAB — COMPLEMENT, TOTAL: Compl, Total (CH50): >60 U/mL (ref >41)

## 2016-11-11 LAB — MANNOSE BINDING LECTIN (MBL): MANNOSE BINDING LECTIN (MBL): 3046 ng/mL

## 2016-12-03 ENCOUNTER — Ambulatory Visit: Payer: Medicare Other | Admitting: Internal Medicine

## 2016-12-03 ENCOUNTER — Telehealth: Payer: Self-pay | Admitting: Pulmonary Disease

## 2016-12-03 NOTE — Telephone Encounter (Signed)
Spoke with pt's mother and wants to see if she could get back in to see BQ even after dismissal/ She states they have no where else to go in town. Please advise and discuss with Mellody Dance if this is ok BQ. Thanks.

## 2016-12-04 NOTE — Telephone Encounter (Signed)
Per [facility] policy she cannot be seen here.

## 2016-12-04 NOTE — Telephone Encounter (Signed)
Will hold message in triage until tomorrow (9/19) to further discuss with [name] Davis/Keith.

## 2016-12-05 NOTE — Telephone Encounter (Signed)
[name] is aware that pt has been dismissed from practice, but is insistent that pt is to be seen in clinic for tx of asthma, and is requesting to speak with Philipp Deputy regarding this matter.

## 2016-12-09 ENCOUNTER — Telehealth: Payer: Self-pay

## 2016-12-09 NOTE — Telephone Encounter (Signed)
Please inform mom that for some of the montelukast it was replaced with a different medicine. Please contact pharmacy and see if [facility] received the defective montelukast and let mom know.

## 2016-12-09 NOTE — Telephone Encounter (Signed)
Called the [facility] and the pharmacist stated that Lori Miles's Montelukast Is not the lot number on recall. I called mom and informed her.

## 2016-12-09 NOTE — Telephone Encounter (Signed)
Patients mom called and stated that she got a letter from the pharmacy in regards to a recall for Montelukast. Patients mom asked if we knew about it and is there anything else they could use?

## 2016-12-09 NOTE — Telephone Encounter (Signed)
Discussed this further with Philipp Deputy.  [name] is aware that we will no longer be able to treat pt in this clinic.  Nothing further needed.

## 2016-12-11 ENCOUNTER — Encounter (INDEPENDENT_AMBULATORY_CARE_PROVIDER_SITE_OTHER): Payer: Self-pay

## 2016-12-11 ENCOUNTER — Encounter: Payer: Self-pay | Admitting: Internal Medicine

## 2016-12-11 ENCOUNTER — Ambulatory Visit (INDEPENDENT_AMBULATORY_CARE_PROVIDER_SITE_OTHER): Payer: Medicare Other | Admitting: Internal Medicine

## 2016-12-11 VITALS — BP 118/76 | HR 72 | Ht <= 58 in | Wt 188.4 lb

## 2016-12-11 DIAGNOSIS — K219 Gastro-esophageal reflux disease without esophagitis: Secondary | ICD-10-CM | POA: Diagnosis not present

## 2016-12-11 DIAGNOSIS — K5909 Other constipation: Secondary | ICD-10-CM | POA: Diagnosis not present

## 2016-12-11 NOTE — Progress Notes (Signed)
   Subjective:    Patient ID: Lori Miles, female    DOB: 1995-04-08, 21 y.o.   MRN: [id number]  HPI Shailee Foots is a 21 year old female with a history of GERD, Chronic constipation, seizure disorder, autism,Obesity, and interstitial cystitis who is here for follow-up. She was seen in April 2017 to evaluate coughing and reflux along with constipation. She is here today with her mother. During this evaluation she had an upper endoscopy which was performed on 08/15/2015.EGD revealed normal esophagus, mild erythema in the prepyloric stomach and otherwise normal exam. 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 Pylori. Esophageal biopsies were normal.  She has been doing well on ranitidine 150 mg twice a day. This has helped her cough and is controlling reflux. She is using MiraLAX 17 g twice a day which is controlling constipation. Appetite has been good. No nausea or vomiting. No trouble swallowing. No blood in her stool or melena. She denies abdominal pain. No seizures in over 2 years.  She was diagnosed recently with obstructive sleep apnea and CPAP has been ordered. They're having a hard time getting her CPAP machine covered by insurance  Review of Systems As per history of present illness, otherwise negative  Current Medications, Allergies, Past Medical History, Past Surgical History, Family History and Social History were reviewed in [employer] record.     Objective:   Physical Exam BP 118/76   Pulse 72   Ht 4' 9.5" (1.461 m)   Wt 188 lb 6 oz (85.4 kg)   BMI 40.06 kg/m  Constitutional: Well-developed and well-nourished. No distress. HEENT: Normocephalic and atraumatic. No scleral icterus. Neck: Neck supple. Trachea midline. Abdominal: Soft, nontender, nondistended. Bowel sounds active throughout.  Extremities: no clubbing, cyanosis, or edema Neurological: Alert and oriented to person place and time. Skin: Skin is warm and  dry. Psychiatric: Normal mood and affect. Behavior is normal.     Assessment & Plan:  20 year old female with a history of GERD, Chronic constipation, seizure disorder, autism,Obesity, and interstitial cystitis who is here for follow-up.  1. GERD with cough -- Well controlled with ranitidine 150 mg 2 times a day. Continue this dose  2. Chronic constipation -- well-controlled with MiraLAX, continue MiraLAX 17 g twice a day  3. Sleep apnea -- attempting to obtain CPAP after positive sleep study revealing obstructive sleep apnea. I would expect controlling her sleep apnea to also improve her issues with GERD and cough.  1-2 year office follow-up [place] if needed  15 minutes spent with the patient today. Greater than 50% was spent in counseling and coordination of care with the patient

## 2016-12-11 NOTE — Patient Instructions (Signed)
Please follow up with Dr Rhea Belton in 1-2 years.  If you are age 22 or older, your body mass index should be between 23-30. Your Body mass index is 40.06 kg/m. If this is out of the aforementioned range listed, please consider follow up with your Primary Care Provider.  If you are age 85 or younger, your body mass index should be between 19-25. Your Body mass index is 40.06 kg/m. If this is out of the aformentioned range listed, please consider follow up with your Primary Care Provider.

## 2016-12-25 ENCOUNTER — Encounter (HOSPITAL_COMMUNITY): Payer: Self-pay | Admitting: Family Medicine

## 2016-12-25 ENCOUNTER — Ambulatory Visit (HOSPITAL_COMMUNITY)
Admission: EM | Admit: 2016-12-25 | Discharge: 2016-12-25 | Disposition: A | Discharge status: Home / Self Care | Payer: Medicare Other | Attending: Urgent Care | Admitting: Urgent Care

## 2016-12-25 ENCOUNTER — Telehealth: Payer: Self-pay

## 2016-12-25 DIAGNOSIS — J029 Acute pharyngitis, unspecified: Secondary | ICD-10-CM | POA: Diagnosis not present

## 2016-12-25 DIAGNOSIS — R05 Cough: Secondary | ICD-10-CM

## 2016-12-25 DIAGNOSIS — J9801 Acute bronchospasm: Secondary | ICD-10-CM

## 2016-12-25 DIAGNOSIS — R059 Cough, unspecified: Secondary | ICD-10-CM

## 2016-12-25 MED ORDER — PREDNISONE 20 MG PO TABS: ORAL_TABLET | ORAL | 0 refills | Status: DC

## 2016-12-25 MED ORDER — BENZONATATE 100 MG PO CAPS: 100.0000 mg | ORAL_CAPSULE | Freq: Three times a day (TID) | ORAL | 0 refills | Status: DC | PRN

## 2016-12-25 NOTE — Telephone Encounter (Signed)
Patient's mom called and stated that Lori Miles has had a sore throat for a couple of days and some wheezing. They have done the albuterol inhaler and nebulizer which did help. Mom would like to know if we can send in prednisone or something along that line. I did ask if appointment was needed and mom said no just wanted to know what could be sent in.     Please advise and thank you.

## 2016-12-25 NOTE — Telephone Encounter (Signed)
Mom called back before Dr. Lucie Leather could reply to my earlier message. Stated that [place] began "throwing up mucus" while she was wheezing and coughing. She also said that her throat was beginning to hurt worse so she went ahead and took her to urgent care where she was given prednisone. There is a ED note in the chart.

## 2016-12-25 NOTE — Telephone Encounter (Signed)
Mom contacted [name] by telephone and stated that she took Brelee to the emergency room and she was once again administered systemic steroids.

## 2016-12-25 NOTE — Discharge Instructions (Signed)
For sore throat try using a honey-based tea. Use 3 teaspoons of honey with juice squeezed from half lemon. Place shaved pieces of ginger into 1/2-1 cup of water and warm over stove top. Then mix the ingredients and repeat every 4 hours as needed. 

## 2016-12-25 NOTE — ED Provider Notes (Signed)
MRN: [id number] DOB: 1995-11-24  Subjective:   Lori Miles is a 21 y.o. female presenting for chief complaint of Wheezing and Sore Throat  Reports 3 day history of dry cough, wheezing, sore throat, fever, post-tussive emesis. Patient has autism and cannot communicate symptoms very well. Patient has a history of asthma, managed with albuterol, Pulmicort nebulizer. Patient has allergies, mother reports that she always gets flares of her asthma and allergies same time each year. Takes Zyrtec and Singulair for this.   No current facility-administered medications for this encounter.   Current Outpatient Prescriptions:  .  albuterol (PROVENTIL) (2.5 MG/3ML) 0.083% nebulizer solution, Inhale the contents of one vial in nebulizer every four to six hours as needed for cough or wheeze., Disp: 120 mL, Rfl: 1 .  benzonatate (TESSALON) 100 MG capsule, Take 1-2 capsules (100-200 mg total) by mouth 3 (three) times daily as needed for cough., Disp: 60 capsule, Rfl: 0 .  budesonide (PULMICORT) 1 MG/2ML nebulizer solution, Take 2 mLs (1 mg total) by nebulization 2 (two) times daily., Disp: 120 mL, Rfl: 5 .  cetirizine (ZYRTEC) 10 MG tablet, Take one tablet daily for runny nose or itching. (Patient taking differently: Take 10 mg by mouth daily. ), Disp: 30 tablet, Rfl: 5 .  cloNIDine (CATAPRES) 0.1 MG tablet, take 1 and 1/2 tablets by mouth at bedtime, Disp: 45 tablet, Rfl: 5 .  Cranberry 500 MG CAPS, Take 500 mg by mouth daily. Reported on 09/07/2015, Disp: , Rfl:  .  DEPAKOTE 500 MG DR tablet, Take 1 tablet (500 mg total) by mouth 2 (two) times daily., Disp: 60 tablet, Rfl: 5 .  formoterol (PERFOROMIST) 20 MCG/2ML nebulizer solution, Take 2 mLs (20 mcg total) by nebulization 2 (two) times daily., Disp: 120 mL, Rfl: 5 .  hyoscyamine (LEVSIN SL) 0.125 MG SL tablet, DISSOLVE 1 TABLET UNDER THE TONGUE 2 TIMES A DAY, Disp: , Rfl: 0 .  Levonorgestrel-Ethinyl Estradiol (SEASONIQUE) 0.15-0.03 &0.01 MG tablet,  Take 1 tablet by mouth daily. Take 1 active tablet daily, skip non active pills., Disp: 1 Package, Rfl: 11 .  Melatonin 3 MG TABS, Take 3 mg by mouth at bedtime. For insomnia, Disp: , Rfl:  .  montelukast (SINGULAIR) 10 MG tablet, Take one tablet each evening to prevent cough or wheeze., Disp: 30 tablet, Rfl: 5 .  ondansetron (ZOFRAN) 4 MG tablet, Take 1 tablet (4 mg total) by mouth every 6 (six) hours., Disp: 12 tablet, Rfl: 0 .  predniSONE (DELTASONE) 20 MG tablet, Take 1 tablet daily with breakfast., Disp: 5 tablet, Rfl: 0 .  PULMICORT 0.5 MG/2ML nebulizer solution, inhale contents of 1 vial in nebulizer twice a day MAY INCREASE T...  (REFER TO PRESCRIPTION NOTES)., Disp: , Rfl: 0 .  ranitidine (ZANTAC) 150 MG capsule, Take 1 capsule (150 mg total) by mouth 2 (two) times daily., Disp: 60 capsule, Rfl: 3 .  senna (SENOKOT) 8.6 MG tablet, Take 1 tablet by mouth daily., Disp: , Rfl:  .  torsemide (DEMADEX) 10 MG tablet, Take 15 mg by mouth daily. , Disp: , Rfl:  .  traZODone (DESYREL) 50 MG tablet, Take 2 tablets (100 mg total) by mouth at bedtime., Disp: 62 tablet, Rfl: 5   Lori Miles is allergic to abilify [aripiprazole]; seroquel [quetiapine fumarate]; doxycycline; amoxicillin-pot clavulanate; and keppra [levetiracetam].  Lori Miles  has a past medical history of Allergy; Asthma; Autism; Bronchitis; Constipation; Frequency-urgency syndrome; GERD (gastroesophageal reflux disease); Interstitial cystitis; Mental deficiency; Pneumonia; PONV (postoperative nausea and vomiting); Seizure (  HCC); and Sleep apnea. Also  has a past surgical history that includes Cystoscopy; Cystoscopy with hydrodistension and biopsy (04/14/2012); and Tonsillectomy.  Objective:   Vitals: BP 116/78   Pulse (!) 116   Temp 98.8 F (37.1 C)   Resp 18   SpO2 95%   Physical Exam  Constitutional: She is oriented to person, place, and time. She appears well-developed and well-nourished.  HENT:  Mouth/Throat: Oropharynx is clear  and moist.  Neck: Normal range of motion. Neck supple.  Cardiovascular: Normal rate, regular rhythm and intact distal pulses.  Exam reveals no gallop and no friction rub.   No murmur heard. Pulmonary/Chest: No respiratory distress. She has wheezes (mild over mid-lower lung fields bilaterally). She has no rales.  Lymphadenopathy:    She has no cervical adenopathy.  Neurological: She is alert and oriented to person, place, and time.  Skin: Skin is warm and dry.   Assessment and Plan :   Sore throat  Bronchospasm  Cough  Will start patient on steroid course for her asthma. Schedule albuterol nebulizer at home every 6 hours. Use honey based tea for sore throat. Physical exam findings reassuring against strep throat. Return-to-clinic precautions discussed, patient verbalized understanding.   Wallis Bamberg, PA-C [facility]  12/25/2016  10:19 AM    Wallis Bamberg, PA-C 12/25/16 1045

## 2016-12-25 NOTE — ED Triage Notes (Signed)
Pt here for wheezing, cough and sore throat.

## 2016-12-30 ENCOUNTER — Other Ambulatory Visit: Payer: Self-pay

## 2016-12-30 ENCOUNTER — Telehealth: Payer: Self-pay | Admitting: Allergy and Immunology

## 2016-12-30 MED ORDER — CETIRIZINE HCL 10 MG PO TABS: ORAL_TABLET | ORAL | 2 refills | Status: DC

## 2016-12-30 NOTE — Telephone Encounter (Signed)
I spoke with mom today and I have sent that prescription in.

## 2016-12-30 NOTE — Telephone Encounter (Signed)
Verbally called in and gave rx to pharmacy

## 2016-12-30 NOTE — Telephone Encounter (Signed)
Pt mom called and said that the [facility] [address] did not recieve the zyrtec last week because they were with out power and you have to call it in because there system is down.

## 2016-12-30 NOTE — Addendum Note (Signed)
Addended by: Mliss Fritz I on: 12/30/2016 02:00 PM   Modules accepted: Orders

## 2017-01-02 ENCOUNTER — Telehealth: Payer: Self-pay | Admitting: *Deleted

## 2017-01-02 ENCOUNTER — Other Ambulatory Visit: Payer: Self-pay | Admitting: Obstetrics

## 2017-01-02 ENCOUNTER — Other Ambulatory Visit: Payer: Self-pay | Admitting: Family

## 2017-01-02 DIAGNOSIS — G4700 Insomnia, unspecified: Secondary | ICD-10-CM

## 2017-01-02 NOTE — Telephone Encounter (Signed)
Mother called and states patient got a new insurance and needs pa for pulmicort due to being an exception. Mother advised we would contact pharmacy and if she could bring us new insurance card. Writer called pharmacy and they state that generic is covered but mother does not want to get generic since it does not work. [facility] is going to send info about pa.

## 2017-01-03 NOTE — Telephone Encounter (Signed)
Mom called and need the nurse to call her [phone].

## 2017-01-06 NOTE — Telephone Encounter (Signed)
I spoke with mom and she did get the generic Pulmicort. Mom says that she is going to bring the new insurance info in on Wednesday. She said that she would give the generic a try because I explained that she would have to try/fail the generic first in order to get the new insurance to pay for the name brand.

## 2017-01-06 NOTE — Telephone Encounter (Signed)
Will need new insurance information before PA can be submitted

## 2017-01-24 ENCOUNTER — Telehealth: Payer: Self-pay | Admitting: Internal Medicine

## 2017-01-24 MED ORDER — RANITIDINE HCL 150 MG PO CAPS: 150.0000 mg | ORAL_CAPSULE | Freq: Two times a day (BID) | ORAL | 3 refills | Status: DC

## 2017-01-24 NOTE — Telephone Encounter (Signed)
Rx sent 

## 2017-01-27 ENCOUNTER — Telehealth: Payer: Self-pay | Admitting: Allergy and Immunology

## 2017-01-27 MED ORDER — CETIRIZINE HCL 10 MG PO TABS: ORAL_TABLET | ORAL | 0 refills | Status: DC

## 2017-01-27 NOTE — Telephone Encounter (Signed)
Mom needs the zyrtec [facility] and she made appointment on 02/12/2017 at 10:45.

## 2017-01-27 NOTE — Telephone Encounter (Signed)
Prescription has been sent in with note to pharmacy stating that the scheduled OV must be kept.

## 2017-01-28 ENCOUNTER — Telehealth: Payer: Self-pay | Admitting: Adult Health

## 2017-01-28 NOTE — Telephone Encounter (Signed)
Discussed with Philipp Deputyammy Davis, who requests that we follow up with this message when Mellody DanceKeith returns to clinic tomorrow (11/14).

## 2017-01-28 NOTE — Telephone Encounter (Signed)
Spoke with pt's mother (dpr on file), pt is requesting to schedule pt with clinic.  I advised pt's mother that we have discussed many times that pt has been dismissed from this clinic and cannot be scheduled with our practice.  [name] is requesting to speak to our director as she is unsatisfied with the information that I and our Chiropodistassistant director have shared with her on multiple documented phone and personal encounters.

## 2017-01-31 ENCOUNTER — Ambulatory Visit: Payer: Medicare Other | Admitting: Internal Medicine

## 2017-01-31 ENCOUNTER — Telehealth: Payer: Self-pay | Admitting: Internal Medicine

## 2017-01-31 NOTE — Telephone Encounter (Signed)
Called and spoke with pt's mother, Lori Miles, regarding her wanting to see if she can get her daughter re-established at our practice after she was dismissed April 2017.  Told [name] that MR has agreed to see her daughter as a patient and that we will get her back in our practice.  [name] was very grateful that MR had agreed to do this.  I asked [name] if she wanted me to go ahead and scheduled an appt for [facility] to come in for an appt, and she told me that she would.  Told [name] that [name] would have to come in for a new consult since she is re-establishing with our practice, and [name] expressed understanding.  Scheduled Alnita for a consult appt 03/03/17 at 3:45 with MR.  Nothing further needed.

## 2017-02-03 ENCOUNTER — Other Ambulatory Visit: Payer: Self-pay | Admitting: Certified Nurse Midwife

## 2017-02-03 ENCOUNTER — Telehealth: Payer: Self-pay

## 2017-02-03 MED ORDER — LEVONORGEST-ETH ESTRAD 91-DAY 0.15-0.03 &0.01 MG PO TABS: 1.0000 | ORAL_TABLET | Freq: Every day | ORAL | 4 refills | Status: DC

## 2017-02-03 NOTE — Progress Notes (Unsigned)
ame

## 2017-02-03 NOTE — Telephone Encounter (Signed)
Mom informed that insurance will no longer cover seasonique. Lori Miles has prescribed a different birth control 

## 2017-02-05 ENCOUNTER — Telehealth (INDEPENDENT_AMBULATORY_CARE_PROVIDER_SITE_OTHER): Payer: Self-pay | Admitting: Family

## 2017-02-05 DIAGNOSIS — G4700 Insomnia, unspecified: Secondary | ICD-10-CM

## 2017-02-05 MED ORDER — CLONIDINE HCL 0.1 MG PO TABS: ORAL_TABLET | ORAL | 0 refills | Status: DC

## 2017-02-05 NOTE — Telephone Encounter (Signed)
°  Who's calling (name and relationship to patient) : [name] (mom) Best contact number: [phone] Provider they see: Blane OharaGoodpasture  Reason for call: Mom called for a refill of medication. Please call    PRESCRIPTION REFILL ONLY  Name of prescription: Clonidine 0.1mg   [facility]

## 2017-02-05 NOTE — Telephone Encounter (Signed)
I left a message with the family and also the pharmacy that they need to make an appointment before we can refill further prescriptions.

## 2017-02-12 ENCOUNTER — Ambulatory Visit (INDEPENDENT_AMBULATORY_CARE_PROVIDER_SITE_OTHER): Payer: Medicare Other | Admitting: Allergy and Immunology

## 2017-02-12 ENCOUNTER — Telehealth (INDEPENDENT_AMBULATORY_CARE_PROVIDER_SITE_OTHER): Payer: Self-pay | Admitting: Family

## 2017-02-12 VITALS — BP 110/70 | HR 76 | Resp 20

## 2017-02-12 DIAGNOSIS — G4700 Insomnia, unspecified: Secondary | ICD-10-CM

## 2017-02-12 DIAGNOSIS — J3089 Other allergic rhinitis: Secondary | ICD-10-CM | POA: Diagnosis not present

## 2017-02-12 DIAGNOSIS — J4540 Moderate persistent asthma, uncomplicated: Secondary | ICD-10-CM | POA: Diagnosis not present

## 2017-02-12 DIAGNOSIS — K219 Gastro-esophageal reflux disease without esophagitis: Secondary | ICD-10-CM

## 2017-02-12 MED ORDER — CLONIDINE HCL 0.1 MG PO TABS: ORAL_TABLET | ORAL | 2 refills | Status: DC

## 2017-02-12 NOTE — Telephone Encounter (Signed)
I called Mom back. I explained that Lori Miles needs a routine follow up appointment, and scheduled her for April 01, 2016 @ 10AM. Mom said that she needed Clonidine refill so I sent that in for her. TG

## 2017-02-12 NOTE — Progress Notes (Signed)
Follow-up Note  Referring Provider: Fleet ContrasAvbuere, Edwin, MD Primary Provider: Fleet ContrasAvbuere, Edwin, MD Date of Office Visit: 02/12/2017  Subjective:   Lori Miles (DOB: 04/27/1995) is a 21 y.o. female [facility] in re-evaluation of the following:  HPI: Lori Miles returns to this clinic in reevaluation of her asthma and allergic rhinoconjunctivitis and reflux induced respiratory disease.  I last saw her in this clinic on 05 November 2016.  Overall, according to her mom, she is doing much better in general.  She has been consistently using her nebulized steroid and long-acting bronchodilator and therapy for reflux and has had very little problems with her respiratory tract and it does not sound as though she has required a systemic steroid over the course of the past several months to treat any type of respiratory tract issue and it does not sound as though she has required an antibiotic over the course of the past several months to treat any type of respiratory tract issue.  She does not use a nasal steroid very often but does continue on all other prescribed medications..  Allergies as of 02/12/2017      Reactions   Abilify [aripiprazole] Swelling, Palpitations   Seroquel [quetiapine Fumarate] Palpitations   Doxycycline Other (See Comments)   Stomach cramps   Amoxicillin-pot Clavulanate Hives   Keppra [levetiracetam] Other (See Comments)   Aggressive behavior       Medication List      albuterol (2.5 MG/3ML) 0.083% nebulizer solution Commonly known as:  PROVENTIL Inhale the contents of one vial in nebulizer every four to six hours as needed for cough or wheeze.   benzonatate 100 MG capsule Commonly known as:  TESSALON Take 1-2 capsules (100-200 mg total) by mouth 3 (three) times daily as needed for cough.   budesonide 1 MG/2ML nebulizer solution Commonly known as:  PULMICORT Take 2 mLs (1 mg total) by nebulization 2 (two) times daily.   PULMICORT 0.5 MG/2ML nebulizer solution Generic drug:  budesonide inhale contents of 1 vial in nebulizer twice a day MAY INCREASE T...  (REFER TO PRESCRIPTION NOTES).   cetirizine 10 MG tablet Commonly known as:  ZYRTEC Take one tablet daily for runny nose or itching.   cloNIDine 0.1 MG tablet Commonly known as:  CATAPRES take 1 and 1/2 tablet by mouth at bedtime   Cranberry 500 MG Caps Take 500 mg by mouth daily. Reported on 09/07/2015   DEPAKOTE 500 MG DR tablet Generic drug:  divalproex Take 1 tablet (500 mg total) by mouth 2 (two) times daily.   formoterol 20 MCG/2ML nebulizer solution Commonly known as:  PERFOROMIST Take 2 mLs (20 mcg total) by nebulization 2 (two) times daily.   hyoscyamine 0.125 MG SL tablet Commonly known as:  LEVSIN SL DISSOLVE 1 TABLET UNDER THE TONGUE 2 TIMES A DAY   Levonorgestrel-Ethinyl Estradiol 0.15-0.03 &0.01 MG tablet Commonly known as:  AMETHIA Take 1 tablet daily by mouth.   Melatonin 3 MG Tabs Take 3 mg by mouth at bedtime. For insomnia   montelukast 10 MG tablet Commonly known as:  SINGULAIR Take one tablet each evening to prevent cough or wheeze.   ondansetron 4 MG tablet Commonly known as:  ZOFRAN Take 1 tablet (4 mg total) by mouth every 6 (six) hours.   ranitidine 150 MG capsule Commonly known as:  ZANTAC Take 1 capsule (150 mg total) 2 (two) times daily by mouth.   senna 8.6 MG tablet Commonly  known as:  SENOKOT Take 1 tablet by mouth daily.   torsemide 10 MG tablet Commonly known as:  DEMADEX Take 15 mg by mouth daily.   traZODone 50 MG tablet Commonly known as:  DESYREL Take 2 tablets (100 mg total) by mouth at bedtime.       Past Medical History:  Diagnosis Date  . Allergy   . Asthma   . Autism    mom states no actual dx, but admits she has some symptoms  . Bronchitis   . Constipation   . Frequency-urgency syndrome     some incontinence ; wears pull-ups  . GERD (gastroesophageal reflux disease)   .  Interstitial cystitis   . Mental deficiency    hx of aggressive behavior,impaired speech   . Pneumonia   . PONV (postoperative nausea and vomiting)   . Seizure (HCC)    most recent sz " at the end of summer"-last sz years ago per mother  . Sleep apnea    needing to order CPAP    Past Surgical History:  Procedure Laterality Date  . CYSTOSCOPY    . CYSTOSCOPY WITH HYDRODISTENSION AND BIOPSY  04/14/2012   Procedure: CYSTOSCOPY/BIOPSY/HYDRODISTENSION;  Surgeon: Lindaann SloughMarc-Henry Nesi, MD;  [facility];  Service: Urology;;  instillation of marcaine and pyridium  . TONSILLECTOMY      Review of systems negative except as noted in HPI / PMHx or noted below:  Review of Systems  Constitutional: Negative.   HENT: Negative.   Eyes: Negative.   Respiratory: Negative.   Cardiovascular: Negative.   Gastrointestinal: Negative.   Genitourinary: Negative.   Musculoskeletal: Negative.   Skin: Negative.   Neurological: Negative.   Endo/Heme/Allergies: Negative.   Psychiatric/Behavioral: Negative.      Objective:   Vitals:   02/12/17 1012  BP: 110/70  Pulse: 76  Resp: 20          Physical Exam  Constitutional: She is well-developed, well-nourished, and in no distress.  HENT:  Head: Normocephalic.  Right Ear: Tympanic membrane, external ear and ear canal normal.  Left Ear: Tympanic membrane, external ear and ear canal normal.  Nose: Nose normal. No mucosal edema or rhinorrhea.  Mouth/Throat: Uvula is midline, oropharynx is clear and moist and mucous membranes are normal. No oropharyngeal exudate.  Eyes: Conjunctivae are normal.  Neck: Trachea normal. No tracheal tenderness present. No tracheal deviation present. No thyromegaly present.  Cardiovascular: Normal rate, regular rhythm, S1 normal, S2 normal and normal heart sounds.  No murmur heard. Pulmonary/Chest: Breath sounds normal. No stridor. No respiratory distress. She has no wheezes. She has no rales.    Musculoskeletal: She exhibits no edema.  Lymphadenopathy:       Head (right side): No tonsillar adenopathy present.       Head (left side): No tonsillar adenopathy present.    She has no cervical adenopathy.  Neurological: She is alert. Gait normal.  Skin: No rash noted. She is not diaphoretic. No erythema. Nails show no clubbing.    Diagnostics: Results of blood tests obtained at 05 November 2016 identifies a very significant antibody response to her Pneumovax vaccination with high levels of antibodies directed against various serotypes of pneumococcus.   Spirometry was performed and demonstrated an FEV1 of 1.58 at 69 % of predicted.  The patient had an Asthma Control Test with the following results: ACT Total Score: 19.    Assessment and Plan:   1. Asthma, moderate persistent, well-controlled   2. Other allergic rhinitis  3. Gastroesophageal reflux disease, esophagitis presence not specified     1. Continue a combination of the following:   A. budesonide 1 mg nebulized twice a day  B. Perforomist nebulized twice a day  C. Nasonex or Flonase one spray a few times per week  D. montelukast 10 mg daily  E. cetirizine 10 mg daily  2. Treat reflux with a combination of the following:   A. ranitidine 300 mg twice a day  B. No caffeine or chocolate consumption  3. Continue albuterol nebulization every 4-6 hours if needed  4. "Action plan" for asthma flare up:   A. increase budesonide to 4 times a day  B. continue Perforomist twice a day  C. use albuterol if needed  5. Obtain a tetanus booster  6. Return in 3 months or earlier if problem  Overall Julius appears to be doing relatively well on her current medical plan and I would like for her to continue on this plan for the next 3 months and I will see her back in this clinic at that point in time or earlier if there is a problem.  She needs to obtain the tetanus booster as she does not have any protective antibodies directed  against tetanus and her mom will get that arranged sometime in the near future.  Laurette Schimke, MD Allergy / Immunology [facility]

## 2017-02-12 NOTE — Patient Instructions (Addendum)
  1. Continue a combination of the following:   A. budesonide 1 mg nebulized twice a day  B. Perforomist nebulized twice a day  C. Nasonex or Flonase one spray a few times per week  D. montelukast 10 mg daily  E. cetirizine 10 mg daily  2. Treat reflux with a combination of the following:   A. ranitidine 300 mg twice a day  B. No caffeine or chocolate consumption  3. Continue albuterol nebulization every 4-6 hours if needed  4. "Action plan" for asthma flare up:   A. increase budesonide to 4 times a day  B. continue Perforomist twice a day  C. use albuterol if needed  5. Obtain a tetanus booster  6. Return in 3 months or earlier if problem

## 2017-02-12 NOTE — Telephone Encounter (Signed)
°  Who's calling (name and relationship to patient) : [name] (mom) Best contact number: [phone] Provider they see: Blane OharaGoodpasture Reason for call: Mom called to speak with Lori Miles, stating she ask for a refill and the nurse told her that she need an appt.  She would not leave message for you,    PRESCRIPTION REFILL ONLY  Name of prescription:  Pharmacy:

## 2017-02-13 ENCOUNTER — Encounter: Payer: Self-pay | Admitting: Allergy and Immunology

## 2017-02-14 ENCOUNTER — Telehealth: Payer: Self-pay | Admitting: Internal Medicine

## 2017-02-14 NOTE — Telephone Encounter (Signed)
Is requesting Dr. Okey Duprerawford to take on as a patient.

## 2017-02-17 NOTE — Telephone Encounter (Signed)
Pt is scheduled to see MR to re-establish on 12.17.18 Will sign off

## 2017-02-18 NOTE — Telephone Encounter (Signed)
Fine

## 2017-02-19 ENCOUNTER — Encounter (HOSPITAL_COMMUNITY): Payer: Self-pay | Admitting: Emergency Medicine

## 2017-02-19 ENCOUNTER — Ambulatory Visit (HOSPITAL_COMMUNITY)
Admission: EM | Admit: 2017-02-19 | Discharge: 2017-02-19 | Disposition: A | Discharge status: Home / Self Care | Payer: Medicare Other | Attending: Family Medicine | Admitting: Family Medicine

## 2017-02-19 DIAGNOSIS — J069 Acute upper respiratory infection, unspecified: Secondary | ICD-10-CM

## 2017-02-19 DIAGNOSIS — J45901 Unspecified asthma with (acute) exacerbation: Secondary | ICD-10-CM

## 2017-02-19 MED ORDER — PREDNISONE 10 MG (21) PO TBPK: ORAL_TABLET | ORAL | 0 refills | Status: DC

## 2017-02-19 MED ORDER — AZITHROMYCIN 250 MG PO TABS: 250.0000 mg | ORAL_TABLET | Freq: Every day | ORAL | 0 refills | Status: DC

## 2017-02-19 NOTE — ED Triage Notes (Signed)
Pt here for wheezing and cough x 2 days

## 2017-02-19 NOTE — ED Provider Notes (Signed)
[facility]   [id number] 02/19/17 Arrival Time: 1536  ASSESSMENT & PLAN:  1. Viral upper respiratory tract infection   2. Moderate asthma with exacerbation, unspecified whether persistent     Meds ordered this encounter  Medications  . predniSONE (STERAPRED UNI-PAK 21 TAB) 10 MG (21) TBPK tablet    Sig: Take as directed.    Dispense:  21 tablet    Refill:  0  . azithromycin (ZITHROMAX) 250 MG tablet    Sig: Take 1 tablet (250 mg total) by mouth daily. Take first 2 tablets together, then 1 every day until finished.    Dispense:  6 tablet    Refill:  0   Observation. Will f/u with PCP or here if needed; ED if acute worsening. OTC symptom care as needed.  Reviewed expectations re: course of current medical issues. Questions answered. Outlined signs and symptoms indicating need for more acute intervention. Patient verbalized understanding. After Visit Summary given.   SUBJECTIVE:  Lori Miles is a 21 y.o. female who presents with complaint of nasal congestion, post-nasal drainage, and a persistent cough. Onset abrupt approximately 2 days ago. Overall fatigued. SOB: none. Wheezing: moderate at times. Fever: no. Overall normal PO intake without n/v. Sick contacts: no. Coughing and wheezing worse at night per caregiver. Using albuterol neb when needed.  Social History   Tobacco Use  Smoking Status Never Smoker  Smokeless Tobacco Never Used   OTC treatment: none.  ROS: As per HPI.   OBJECTIVE:  Vitals:   02/19/17 1558  BP: 122/90  Pulse: (!) 112  Resp: 18  Temp: 98.4 F (36.9 C)  TempSrc: Oral  SpO2: 100%    General appearance: alert; no distress HEENT: nasal congestion; clear runny nose; throat irritation secondary to post-nasal drainage Neck: supple without LAD Lungs: mild to moderate exp wheezes bilaterally; no resp distress; mild cough Skin: warm and dry Psychological: alert and cooperative; normal mood and affect   Allergies  Allergen  Reactions  . Abilify [Aripiprazole] Swelling and Palpitations  . Seroquel [Quetiapine Fumarate] Palpitations  . Doxycycline Other (See Comments)    Stomach cramps  . Amoxicillin-Pot Clavulanate Hives  . Keppra [Levetiracetam] Other (See Comments)    Aggressive behavior     Past Medical History:  Diagnosis Date  . Allergy   . Asthma   . Autism    mom states no actual dx, but admits she has some symptoms  . Bronchitis   . Constipation   . Frequency-urgency syndrome     some incontinence ; wears pull-ups  . GERD (gastroesophageal reflux disease)   . Interstitial cystitis   . Mental deficiency    hx of aggressive behavior,impaired speech   . Pneumonia   . PONV (postoperative nausea and vomiting)   . Seizure (HCC)    most recent sz " at the end of summer"-last sz years ago per mother  . Sleep apnea    needing to order CPAP    Family History  Problem Relation Age of Onset  . Seizures Mother   . Seizures Sister        1 sister has  . Pancreatic cancer Sister   . Colon cancer Neg Hx   . Colon polyps Neg Hx   . Esophageal cancer Neg Hx   . Rectal cancer Neg Hx   . Stomach cancer Neg Hx     Social History   Socioeconomic History  . Marital status: Single    Spouse name: Not on file  .  Number of children: Not on file  . Years of education: Not on file  . Highest education level: Not on file  Social Needs  . Financial resource strain: Not on file  . Food insecurity - worry: Not on file  . Food insecurity - inability: Not on file  . Transportation needs - medical: Not on file  . Transportation needs - non-medical: Not on file  Occupational History  . Not on file  Tobacco Use  . Smoking status: Never Smoker  . Smokeless tobacco: Never Used  Substance and Sexual Activity  . Alcohol use: No    Alcohol/week: 0.0 oz  . Drug use: No  . Sexual activity: No    Birth control/protection: Pill  Other Topics Concern  . Not on file  Social History Narrative   Lives with  mom Research scientist (physical sciences)([name]) and half-sister [name](Nancy Butz) who is also mentally impaired.  Has CAP assistance, urinary incontinence, needs help with feeding,dressing, toileting   Graduated from [employer].  She enjoys playing with cars, dancing, and dogs.           Mardella LaymanHagler, Slyvester Latona, MD 02/20/17 [phone]

## 2017-02-20 NOTE — Telephone Encounter (Signed)
Patient scheduled.

## 2017-02-25 ENCOUNTER — Telehealth: Payer: Self-pay | Admitting: Allergy and Immunology

## 2017-02-25 ENCOUNTER — Other Ambulatory Visit: Payer: Self-pay | Admitting: Allergy and Immunology

## 2017-02-25 MED ORDER — MONTELUKAST SODIUM 10 MG PO TABS: ORAL_TABLET | ORAL | 2 refills | Status: DC

## 2017-02-25 NOTE — Telephone Encounter (Signed)
Script refilled today [facility]

## 2017-02-25 NOTE — Telephone Encounter (Signed)
Mom called and said that the singulair has not [facility] [address].[phone].

## 2017-02-25 NOTE — Telephone Encounter (Signed)
Received fax for refill for montelukast. Patient was last seen 02/12/2017. Refill sent in.

## 2017-02-27 ENCOUNTER — Emergency Department (HOSPITAL_COMMUNITY): Payer: Medicare Other

## 2017-02-27 ENCOUNTER — Emergency Department (HOSPITAL_COMMUNITY)
Admission: EM | Admit: 2017-02-27 | Discharge: 2017-02-27 | Disposition: A | Discharge status: Home / Self Care | Payer: Medicare Other | Attending: Emergency Medicine | Admitting: Emergency Medicine

## 2017-02-27 ENCOUNTER — Other Ambulatory Visit: Payer: Self-pay

## 2017-02-27 ENCOUNTER — Encounter (HOSPITAL_COMMUNITY): Payer: Self-pay

## 2017-02-27 ENCOUNTER — Telehealth: Payer: Self-pay | Admitting: Internal Medicine

## 2017-02-27 DIAGNOSIS — F840 Autistic disorder: Secondary | ICD-10-CM | POA: Insufficient documentation

## 2017-02-27 DIAGNOSIS — B9789 Other viral agents as the cause of diseases classified elsewhere: Secondary | ICD-10-CM | POA: Diagnosis not present

## 2017-02-27 DIAGNOSIS — J069 Acute upper respiratory infection, unspecified: Secondary | ICD-10-CM | POA: Diagnosis not present

## 2017-02-27 DIAGNOSIS — J45909 Unspecified asthma, uncomplicated: Secondary | ICD-10-CM | POA: Insufficient documentation

## 2017-02-27 DIAGNOSIS — R05 Cough: Secondary | ICD-10-CM | POA: Diagnosis present

## 2017-02-27 DIAGNOSIS — J029 Acute pharyngitis, unspecified: Secondary | ICD-10-CM | POA: Insufficient documentation

## 2017-02-27 DIAGNOSIS — Z79899 Other long term (current) drug therapy: Secondary | ICD-10-CM | POA: Diagnosis not present

## 2017-02-27 DIAGNOSIS — F79 Unspecified intellectual disabilities: Secondary | ICD-10-CM | POA: Insufficient documentation

## 2017-02-27 LAB — BASIC METABOLIC PANEL
ANION GAP: 11 (ref 5–15)
BUN: 8 mg/dL (ref 6–20)
CHLORIDE: 99 mmol/L — AB (ref 101–111)
CO2: 27 mmol/L (ref 22–32)
Calcium: 8.5 mg/dL — ABNORMAL LOW (ref 8.9–10.3)
Creatinine, Ser: 0.97 mg/dL (ref 0.44–1.00)
GFR calc non Af Amer: >60 mL/min (ref >60)
Glucose, Bld: 170 mg/dL — ABNORMAL HIGH (ref 65–99)
Potassium: 3.3 mmol/L — ABNORMAL LOW (ref 3.5–5.1)
Sodium: 137 mmol/L (ref 135–145)

## 2017-02-27 LAB — CBC WITH DIFFERENTIAL/PLATELET
BASOS PCT: 0 %
Basophils Absolute: 0 10*3/uL (ref 0.0–0.1)
Eosinophils Absolute: 0.1 10*3/uL (ref 0.0–0.7)
Eosinophils Relative: 0 %
HEMATOCRIT: 38.2 % (ref 36.0–46.0)
HEMOGLOBIN: 12.4 g/dL (ref 12.0–15.0)
LYMPHS ABS: 3.8 10*3/uL (ref 0.7–4.0)
Lymphocytes Relative: 27 %
MCH: 30.1 pg (ref 26.0–34.0)
MCHC: 32.5 g/dL (ref 30.0–36.0)
MCV: 92.7 fL (ref 78.0–100.0)
MONOS PCT: 9 %
Monocytes Absolute: 1.3 10*3/uL — ABNORMAL HIGH (ref 0.1–1.0)
NEUTROS ABS: 8.7 10*3/uL — AB (ref 1.7–7.7)
NEUTROS PCT: 64 %
Platelets: 295 10*3/uL (ref 150–400)
RBC: 4.12 MIL/uL (ref 3.87–5.11)
RDW: 13.9 % (ref 11.5–15.5)
WBC: 13.8 10*3/uL — ABNORMAL HIGH (ref 4.0–10.5)

## 2017-02-27 LAB — URINALYSIS, ROUTINE W REFLEX MICROSCOPIC
BILIRUBIN URINE: NEGATIVE
Glucose, UA: NEGATIVE mg/dL
HGB URINE DIPSTICK: NEGATIVE
Ketones, ur: NEGATIVE mg/dL
Leukocytes, UA: NEGATIVE
NITRITE: NEGATIVE
PROTEIN: NEGATIVE mg/dL
Specific Gravity, Urine: 1.012 (ref 1.005–1.030)
pH: 7 (ref 5.0–8.0)

## 2017-02-27 LAB — RAPID STREP SCREEN (MED CTR MEBANE ONLY): Streptococcus, Group A Screen (Direct): NEGATIVE

## 2017-02-27 MED ORDER — ACETAMINOPHEN 325 MG PO TABS: 650.0000 mg | ORAL_TABLET | Freq: Four times a day (QID) | ORAL | 0 refills | Status: AC | PRN

## 2017-02-27 MED ORDER — PREDNISONE 20 MG PO TABS: 40.0000 mg | ORAL_TABLET | Freq: Every day | ORAL | 0 refills | Status: DC

## 2017-02-27 MED ORDER — ACETAMINOPHEN 325 MG PO TABS
650.0000 mg | ORAL_TABLET | Freq: Once | ORAL | Status: AC
  Administered 2017-02-27: 650 mg via ORAL
  Filled 2017-02-27: qty 2

## 2017-02-27 MED ORDER — SODIUM CHLORIDE 0.9 % IV BOLUS (SEPSIS)
1000.0000 mL | Freq: Once | INTRAVENOUS | Status: AC
  Administered 2017-02-27: 1000 mL via INTRAVENOUS

## 2017-02-27 MED ORDER — IPRATROPIUM-ALBUTEROL 0.5-2.5 (3) MG/3ML IN SOLN
3.0000 mL | Freq: Once | RESPIRATORY_TRACT | Status: AC
  Administered 2017-02-27: 3 mL via RESPIRATORY_TRACT
  Filled 2017-02-27: qty 3

## 2017-02-27 NOTE — ED Notes (Signed)
Pt is sleeping , arouses easily. Swallows medicine without difficulty.

## 2017-02-27 NOTE — ED Provider Notes (Signed)
[facility] Provider Note   CSN: [id number] Arrival date & time: 02/27/17  0908     History   Chief Complaint No chief complaint on file.   HPI Lori SchickJasmine Miles is a 21 y.o. female.  Lori Miles is a 21 y.o. Female with a history of autism, DD and seizures who presents to the ED with her mother who reports the patient has been having cough, sore throat, wheezing and hoarse voice over the past two days. Mother reports has had about 2 weeks of URI symptoms including cough.  She reports that the patient felt better after a course of steroids provided to her on December 6 when her symptoms returned about 2 days ago.  She reports a temperature around 100.0 yesterday.  No fever today.  She has had no antipyretics prior to arrival today.  Mother also reports one episode of posttussive emesis prior to arrival today.  Patient has not been complaining of abdominal pain.  Patient did receive her albuterol inhalers and breathing treatment prior to arrival today.  Mother feels like she has been wheezing.  She reports the patient has had diminished appetite and she believes this is due to her sore throat.  Her immunizations are up-to-date.  She is supposed to be on CPAP at night for sleep apnea, however they have not received this yet.  No abdominal pain, diarrhea, rashes, decreased urination, drooling, neck pain, ear pulling.    The history is provided by the patient and medical records. No language interpreter was used.    Past Medical History:  Diagnosis Date  . Allergy   . Asthma   . Autism    mom states no actual dx, but admits she has some symptoms  . Bronchitis   . Constipation   . Frequency-urgency syndrome     some incontinence ; wears pull-ups  . GERD (gastroesophageal reflux disease)   . Interstitial cystitis   . Mental deficiency    hx of aggressive behavior,impaired speech   . Pneumonia   . PONV (postoperative nausea and vomiting)   .  Seizure (HCC)    most recent sz " at the end of summer"-last sz years ago per mother  . Sleep apnea    needing to order CPAP    Patient Active Problem List   Diagnosis Date Noted  . Obstructive sleep apnea 09/30/2016  . Encounter for gynecological examination with Papanicolaou smear of cervix 03/12/2016  . Excessive daytime sleepiness 03/05/2016  . Insomnia 10/04/2015  . Acute bronchitis 05/05/2015  . Dyspnea 05/05/2015  . Gait disorder 03/22/2015  . 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 Cough variant asthma vs UACS/ vcd  03/15/2015  . CAP (community acquired pneumonia) 02/14/2015  . Autism spectrum disorder 02/14/2015  . Acute respiratory failure (HCC) 02/14/2015  . Nausea & vomiting 02/14/2015  . Disturbance in sleep behavior 12/13/2014  . Obesity, morbid (HCC) 12/13/2014  . 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 Acanthosis nigricans 11/14/2014  . Toeing-in 08/29/2014  . Obesity (BMI 30-39.9) 08/23/2013  . 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 Interstitial cystitis 04/28/2012  . Recurrent urinary tract infection 08/01/2011  . Asthma 08/01/2011  . Chronic constipation 08/01/2011  . Contraception 08/01/2011    Past Surgical History:  Procedure Laterality  Date  . CYSTOSCOPY    . CYSTOSCOPY WITH HYDRODISTENSION AND BIOPSY  04/14/2012   Procedure: CYSTOSCOPY/BIOPSY/HYDRODISTENSION;  Surgeon: Lindaann SloughMarc-Henry Nesi, MD;  [facility] [place];  Service: Urology;;  instillation of marcaine and pyridium  . TONSILLECTOMY      OB History    No data available       Home Medications    Prior to Admission medications   Medication Sig Start Date End Date Taking? Authorizing Provider    acetaminophen (TYLENOL) 325 MG tablet Take 2 tablets (650 mg total) by mouth every 6 (six) hours as needed for mild pain or moderate pain. 02/27/17   Everlene Farrieransie, Shalinda Burkholder, PA-C  albuterol (PROVENTIL) (2.5 MG/3ML) 0.083% nebulizer solution Inhale the contents of one vial in nebulizer every four to six hours as needed for cough or wheeze. 04/23/16   Kozlow, Alvira PhilipsEric J, MD  azithromycin (ZITHROMAX) 250 MG tablet Take 1 tablet (250 mg total) by mouth daily. Take first 2 tablets together, then 1 every day until finished. 02/19/17   Mardella LaymanHagler, Brian, MD  benzonatate (TESSALON) 100 MG capsule Take 1-2 capsules (100-200 mg total) by mouth 3 (three) times daily as needed for cough. 12/25/16   Wallis BambergMani, Mario, PA-C  budesonide (PULMICORT) 1 MG/2ML nebulizer solution Take 2 mLs (1 mg total) by nebulization 2 (two) times daily. 07/23/16   Kozlow, Alvira PhilipsEric J, MD  cetirizine (ZYRTEC) 10 MG tablet Take one tablet daily for runny nose or itching. 01/27/17   Kozlow, Alvira PhilipsEric J, MD  cloNIDine (CATAPRES) 0.1 MG tablet take 1 and 1/2 tablet by mouth at bedtime 02/12/17   Elveria RisingGoodpasture, Tina, NP  Cranberry 500 MG CAPS Take 500 mg by mouth daily. Reported on 09/07/2015    [provider]  DEPAKOTE 500 MG DR tablet Take 1 tablet (500 mg total) by mouth 2 (two) times daily. 10/30/16   Elveria RisingGoodpasture, Tina, NP  fluconazole (DIFLUCAN) 200 MG tablet TAKE 1 TABLET ONCE AS DIRECTED AND MAY REPEAT IN 48 TO 72 HOURS. 01/03/17   Brock BadHarper, Charles A, MD  formoterol (PERFOROMIST) 20 MCG/2ML nebulizer solution Take 2 mLs (20 mcg total) by nebulization 2 (two) times daily. 04/25/16   Kozlow, Alvira PhilipsEric J, MD  hyoscyamine (LEVSIN SL) 0.125 MG SL tablet DISSOLVE 1 TABLET UNDER THE TONGUE 2 TIMES A DAY 08/08/14   [provider]  Levonorgestrel-Ethinyl Estradiol (AMETHIA) 0.15-0.03 &0.01 MG tablet Take 1 tablet daily by mouth. 02/03/17   Denney, Rachelle A, CNM  Melatonin 3 MG TABS Take 3 mg by mouth at bedtime. For insomnia    [provider]  montelukast  (SINGULAIR) 10 MG tablet Take one tablet each evening to prevent cough or wheeze. 02/25/17   Kozlow, Alvira PhilipsEric J, MD  ondansetron (ZOFRAN) 4 MG tablet Take 1 tablet (4 mg total) by mouth every 6 (six) hours. 12/31/15   Tharon AquasPatrick, Frank C, PA  predniSONE (DELTASONE) 20 MG tablet Take 2 tablets (40 mg total) by mouth daily. 02/27/17   Everlene Farrieransie, Bobette Leyh, PA-C  PULMICORT 0.5 MG/2ML nebulizer solution inhale contents of 1 vial in nebulizer twice a day MAY INCREASE T...  (REFER TO PRESCRIPTION NOTES). 07/23/16   [provider]  ranitidine (ZANTAC) 150 MG capsule Take 1 capsule (150 mg total) 2 (two) times daily by mouth. 01/24/17   Pyrtle, Carie CaddyJay M, MD  senna (SENOKOT) 8.6 MG tablet Take 1 tablet by mouth daily.    [provider]  torsemide (DEMADEX) 10 MG tablet Take 15 mg by mouth daily.     [provider]  traZODone (DESYREL) 50 MG tablet Take 2 tablets (100 mg total) by mouth at bedtime. 11/07/16   Elveria Rising, NP    Family History Family History  Problem Relation Age of Onset  . Seizures Mother   . Seizures Sister        1 sister has  . Pancreatic cancer Sister   . Colon cancer Neg Hx   . Colon polyps Neg Hx   . Esophageal cancer Neg Hx   . Rectal cancer Neg Hx   . Stomach cancer Neg Hx     Social History Social History   Tobacco Use  . Smoking status: Never Smoker  . Smokeless tobacco: Never Used  Substance Use Topics  . Alcohol use: No    Alcohol/week: 0.0 oz  . Drug use: No     Allergies   Abilify [aripiprazole]; Seroquel [quetiapine fumarate]; Doxycycline; Amoxicillin-pot clavulanate; and Keppra [levetiracetam]   Review of Systems Review of Systems  Constitutional: Positive for appetite change and fever.  HENT: Positive for congestion, postnasal drip, rhinorrhea, sneezing, sore throat and voice change. Negative for drooling.   Eyes: Negative for visual disturbance.  Respiratory: Positive for cough and wheezing. Negative for shortness of breath.     Cardiovascular: Negative for chest pain.  Gastrointestinal: Positive for vomiting. Negative for abdominal pain, diarrhea and nausea.  Genitourinary: Negative for difficulty urinating and frequency.  Musculoskeletal: Negative for back pain and neck pain.  Skin: Negative for rash.  Neurological: Negative for syncope and headaches.     Physical Exam Updated Vital Signs BP 115/71 (BP Location: Right Arm)   Pulse (!) 122   Temp 98.5 F (36.9 C) (Oral)   Resp 16   SpO2 97%   Physical Exam  Constitutional: She appears well-developed and well-nourished. No distress.  HENT:  Head: Normocephalic and atraumatic.  Mouth/Throat: Oropharynx is clear and moist.  Mild bilateral tonsillar hypertrophy without exudates.  Uvula is midline without edema.  No peritonsillar abscess.  No trismus.  No drooling.  Eyes: Conjunctivae are normal. Pupils are equal, round, and reactive to light. Right eye exhibits no discharge. Left eye exhibits no discharge.  Neck: Neck supple. No JVD present. No tracheal deviation present.  Cardiovascular: Regular rhythm, normal heart sounds and intact distal pulses. Exam reveals no gallop and no friction rub.  No murmur heard. Heart rate is 118 on exam.  Pulmonary/Chest: Effort normal and breath sounds normal. No respiratory distress. She has no rales.  Diminished lung sounds to bilateral bases with slight scattered wheezing.  No increased work of breathing.  No rales or rhonchi.  Abdominal: Soft. She exhibits no mass. There is no tenderness. There is no guarding.  Abdomen is soft and nontender to palpation.  Musculoskeletal: She exhibits no edema or tenderness.  Lymphadenopathy:    She has no cervical adenopathy.  Neurological: She is alert. Coordination normal.  Skin: Skin is warm and dry. No rash noted. She is not diaphoretic. No erythema. No pallor.  Psychiatric: She has a normal mood and affect. Her behavior is normal.  Nursing note and vitals reviewed.    ED  Treatments / Results  Labs (all labs ordered are listed, but only abnormal results are displayed) Labs Reviewed  BASIC METABOLIC PANEL - Abnormal; Notable for the following components:      Result Value   Potassium 3.3 (*)    Chloride 99 (*)    Glucose, Bld 170 (*)    Calcium 8.5 (*)    All  other components within normal limits  CBC WITH DIFFERENTIAL/PLATELET - Abnormal; Notable for the following components:   WBC 13.8 (*)    Neutro Abs 8.7 (*)    Monocytes Absolute 1.3 (*)    All other components within normal limits  RAPID STREP SCREEN (NOT AT [facility])  CULTURE, GROUP A STREP ([facility])  URINALYSIS, ROUTINE W REFLEX MICROSCOPIC    EKG  EKG Interpretation None       Radiology Dg Chest 2 View  Result Date: 02/27/2017 CLINICAL DATA:  Cough and fever for several days EXAM: CHEST  2 VIEW COMPARISON:  11/06/2016 FINDINGS: Cardiac shadow is stable. The overall inspiratory effort is again poor with crowding of the vascular markings. No focal confluent infiltrate is seen. Some mild peribronchial changes are seen although these may be accentuated by the poor inspiratory effort. Alternatively it could represent a viral bronchitis. No bony abnormality is seen. The visualized upper abdomen is within normal limits. IMPRESSION: Mild prominent peribronchial markings although may be accentuated by the poor inspiratory effort. A viral etiology could not be excluded. No focal infiltrate is seen. Electronically Signed   By: Alcide Clever M.D.   On: 02/27/2017 10:35    Procedures Procedures (including critical care time)  Medications Ordered in ED Medications  acetaminophen (TYLENOL) tablet 650 mg (650 mg Oral Given 02/27/17 1040)  ipratropium-albuterol (DUONEB) 0.5-2.5 (3) MG/3ML nebulizer solution 3 mL (3 mLs Nebulization Given 02/27/17 0956)  sodium chloride 0.9 % bolus 1,000 mL (0 mLs Intravenous Stopped 02/27/17 1356)  ipratropium-albuterol (DUONEB) 0.5-2.5 (3) MG/3ML nebulizer solution 3 mL (3  mLs Nebulization Given 02/27/17 1356)     Initial Impression / Assessment and Plan / ED Course  I have reviewed the triage vital signs and the nursing notes.  Pertinent labs & imaging results that were available during my care of the patient were reviewed by me and considered in my medical decision making (see chart for details).    This is a 21 y.o. Female with a history of autism, DD and seizures who presents to the ED with her mother who reports the patient has been having cough, sore throat, wheezing and hoarse voice over the past two days. Mother reports has had about 2 weeks of URI symptoms including cough.  She reports that the patient felt better after a course of steroids provided to her on December 6 when her symptoms returned about 2 days ago.  She reports a temperature around 100.0 yesterday.  No fever today.  She has had no antipyretics prior to arrival today.  Mother also reports one episode of posttussive emesis prior to arrival today.  Patient has not been complaining of abdominal pain.  Patient did receive her albuterol inhalers and breathing treatment prior to arrival today.  Mother feels like she has been wheezing.  She reports the patient has had diminished appetite and she believes this is due to her sore throat.  Her immunizations are up-to-date.  On exam the patient is afebrile nontoxic-appearing.  Rhinorrhea is present.  She has mild bilateral tonsillar hypertrophy without exudates.  No drooling.  No trismus.  Uvula is midline without edema.  No peritonsillar abscess.  She is slightly diminished lung sounds to her bilateral bases with scattered wheezes.  No increased work of breathing.  Oxygen saturation is 99% on room air.  She is tachycardic with a heart rate of 118 on exam.  Abdomen is soft and nontender to palpation. Chest x-ray shows mild prominent peribronchial thickening.  Suspect viral  etiology.  No focal infiltrate. Rapid strep is negative. Patient is still tachycardic.   On chart review patient appears to have slight tachycardia and her most recent visits in the emergency department in urgent care.  Usually her heart rate is around 108-116 on chart review. Will check labs and urine and provide with fluid bolus. She does not appear to be in respiratory distress. Low suspicion for PE in this patient.  CBC is remarkable only for a white count of 13,000.  BMP is unremarkable.  Urinalysis without sign of infection.  Following fluid bolus patient's heart rate is still in the 120s.  I suspect this is around her baseline.  She is also receiving breathing treatments which would also elevate her heart rate.  She is in no apparent distress at reevaluation.  She is eating and drinking normally.  She has normal voice at reevaluation.  Suspect viral infection.  Encouraged to push fluids.  Return precautions discussed. I advised the patient to follow-up with their primary care provider this week. I advised the patient to return to the emergency department with new or worsening symptoms or new concerns. The patient verbalized understanding and agreement with plan.    This patient was discussed with and evaluated by Dr. Rubin Payor who agrees with assessment and plan.  Final Clinical Impressions(s) / ED Diagnoses   Final diagnoses:  Viral URI with cough  Viral pharyngitis    ED Discharge Orders        Ordered    acetaminophen (TYLENOL) 325 MG tablet  Every 6 hours PRN     02/27/17 1428    predniSONE (DELTASONE) 20 MG tablet  Daily     02/27/17 1428       Everlene Farrier, PA-C 02/27/17 1438    Benjiman Core, MD 02/27/17 1646

## 2017-02-27 NOTE — ED Triage Notes (Signed)
Patient complains of 1 week of ongoing cough, congestion and wheezing with sore throat, wheezing noted on assessment

## 2017-02-27 NOTE — ED Notes (Signed)
Pt. returned from XR. 

## 2017-02-27 NOTE — Telephone Encounter (Signed)
Pt is dismissed from the practice,  Called Dionne to let her know, unable to leave VM. Will try to CB later.

## 2017-02-28 ENCOUNTER — Other Ambulatory Visit: Payer: Self-pay

## 2017-02-28 ENCOUNTER — Emergency Department (HOSPITAL_COMMUNITY): Payer: Medicare Other

## 2017-02-28 DIAGNOSIS — J069 Acute upper respiratory infection, unspecified: Secondary | ICD-10-CM | POA: Insufficient documentation

## 2017-02-28 DIAGNOSIS — R509 Fever, unspecified: Secondary | ICD-10-CM | POA: Diagnosis present

## 2017-02-28 DIAGNOSIS — Z79899 Other long term (current) drug therapy: Secondary | ICD-10-CM | POA: Insufficient documentation

## 2017-02-28 DIAGNOSIS — J40 Bronchitis, not specified as acute or chronic: Secondary | ICD-10-CM | POA: Insufficient documentation

## 2017-02-28 DIAGNOSIS — F70 Mild intellectual disabilities: Secondary | ICD-10-CM | POA: Insufficient documentation

## 2017-02-28 DIAGNOSIS — F840 Autistic disorder: Secondary | ICD-10-CM | POA: Insufficient documentation

## 2017-02-28 DIAGNOSIS — J45909 Unspecified asthma, uncomplicated: Secondary | ICD-10-CM | POA: Insufficient documentation

## 2017-02-28 MED ORDER — ALBUTEROL SULFATE (2.5 MG/3ML) 0.083% IN NEBU
INHALATION_SOLUTION | RESPIRATORY_TRACT | Status: AC
  Filled 2017-02-28: qty 6

## 2017-02-28 MED ORDER — ALBUTEROL SULFATE (2.5 MG/3ML) 0.083% IN NEBU
2.5000 mg | INHALATION_SOLUTION | Freq: Once | RESPIRATORY_TRACT | Status: AC
  Administered 2017-02-28: 2.5 mg via RESPIRATORY_TRACT

## 2017-02-28 MED ORDER — ALBUTEROL SULFATE (2.5 MG/3ML) 0.083% IN NEBU
5.0000 mg | INHALATION_SOLUTION | Freq: Once | RESPIRATORY_TRACT | Status: AC
  Administered 2017-02-28: 5 mg via RESPIRATORY_TRACT
  Filled 2017-02-28: qty 6

## 2017-02-28 NOTE — Telephone Encounter (Signed)
Pt is scheduled to re-establish care with this clinic on 12/18 with MR.    ATC Dionne X2, no answer and vm full.  Wcb.

## 2017-02-28 NOTE — ED Notes (Signed)
Pt's visitor up to front desk requesting updates, provided answers to questions and apologized for wait.

## 2017-02-28 NOTE — ED Triage Notes (Signed)
Pt has been coughing and having chills. Pt has not had an appetite according to mother. Pt also has been running a low grade fever at home. Heart rate has been tachy.

## 2017-02-28 NOTE — ED Notes (Signed)
Pt was given tylenol from mother before she came her at 6:00 PM TODAY. NOT GIVEN ANY HERE.

## 2017-03-01 ENCOUNTER — Encounter (HOSPITAL_COMMUNITY): Payer: Self-pay | Admitting: Emergency Medicine

## 2017-03-01 ENCOUNTER — Ambulatory Visit (HOSPITAL_COMMUNITY)
Admission: EM | Admit: 2017-03-01 | Discharge: 2017-03-01 | Disposition: A | Discharge status: Home / Self Care | Payer: Medicare Other | Attending: Family Medicine | Admitting: Family Medicine

## 2017-03-01 ENCOUNTER — Emergency Department (HOSPITAL_COMMUNITY)
Admission: EM | Admit: 2017-03-01 | Discharge: 2017-03-01 | Disposition: A | Discharge status: Home / Self Care | Payer: Medicare Other | Attending: Emergency Medicine | Admitting: Emergency Medicine

## 2017-03-01 DIAGNOSIS — J069 Acute upper respiratory infection, unspecified: Secondary | ICD-10-CM

## 2017-03-01 DIAGNOSIS — J40 Bronchitis, not specified as acute or chronic: Secondary | ICD-10-CM

## 2017-03-01 LAB — CBC WITH DIFFERENTIAL/PLATELET
BASOS PCT: 0 %
Basophils Absolute: 0 10*3/uL (ref 0.0–0.1)
Eosinophils Absolute: 0 10*3/uL (ref 0.0–0.7)
Eosinophils Relative: 0 %
HEMATOCRIT: 34.4 % — AB (ref 36.0–46.0)
HEMOGLOBIN: 11.3 g/dL — AB (ref 12.0–15.0)
LYMPHS ABS: 2.5 10*3/uL (ref 0.7–4.0)
LYMPHS PCT: 14 %
MCH: 30 pg (ref 26.0–34.0)
MCHC: 32.8 g/dL (ref 30.0–36.0)
MCV: 91.2 fL (ref 78.0–100.0)
MONOS PCT: 10 %
Monocytes Absolute: 1.8 10*3/uL — ABNORMAL HIGH (ref 0.1–1.0)
NEUTROS ABS: 13.6 10*3/uL — AB (ref 1.7–7.7)
Neutrophils Relative %: 76 %
Platelets: 259 10*3/uL (ref 150–400)
RBC: 3.77 MIL/uL — ABNORMAL LOW (ref 3.87–5.11)
RDW: 14.1 % (ref 11.5–15.5)
WBC: 17.9 10*3/uL — ABNORMAL HIGH (ref 4.0–10.5)

## 2017-03-01 LAB — RAPID STREP SCREEN (MED CTR MEBANE ONLY): Streptococcus, Group A Screen (Direct): NEGATIVE

## 2017-03-01 LAB — URINALYSIS, ROUTINE W REFLEX MICROSCOPIC
BILIRUBIN URINE: NEGATIVE
GLUCOSE, UA: 50 mg/dL — AB
Hgb urine dipstick: NEGATIVE
Ketones, ur: 5 mg/dL — AB
Leukocytes, UA: NEGATIVE
NITRITE: NEGATIVE
PH: 5 (ref 5.0–8.0)
Protein, ur: NEGATIVE mg/dL
SPECIFIC GRAVITY, URINE: 1.032 — AB (ref 1.005–1.030)

## 2017-03-01 LAB — COMPREHENSIVE METABOLIC PANEL
ALBUMIN: 2.9 g/dL — AB (ref 3.5–5.0)
ALK PHOS: 44 U/L (ref 38–126)
ALT: 21 U/L (ref 14–54)
ANION GAP: 11 (ref 5–15)
AST: 24 U/L (ref 15–41)
BILIRUBIN TOTAL: 0.4 mg/dL (ref 0.3–1.2)
BUN: 10 mg/dL (ref 6–20)
CALCIUM: 8.7 mg/dL — AB (ref 8.9–10.3)
CO2: 22 mmol/L (ref 22–32)
Chloride: 103 mmol/L (ref 101–111)
Creatinine, Ser: 0.97 mg/dL (ref 0.44–1.00)
GFR calc Af Amer: >60 mL/min (ref >60)
GFR calc non Af Amer: >60 mL/min (ref >60)
GLUCOSE: 156 mg/dL — AB (ref 65–99)
Potassium: 4.3 mmol/L (ref 3.5–5.1)
Sodium: 136 mmol/L (ref 135–145)
TOTAL PROTEIN: 5.7 g/dL — AB (ref 6.5–8.1)

## 2017-03-01 LAB — MONONUCLEOSIS SCREEN: Mono Screen: NEGATIVE

## 2017-03-01 LAB — CULTURE, GROUP A STREP (THRC)

## 2017-03-01 LAB — I-STAT BETA HCG BLOOD, ED (MC, WL, AP ONLY): I-stat hCG, quantitative: <5 m[IU]/mL (ref <5)

## 2017-03-01 LAB — LIPASE, BLOOD: Lipase: 19 U/L (ref 11–51)

## 2017-03-01 MED ORDER — ACETAMINOPHEN 325 MG PO TABS
650.0000 mg | ORAL_TABLET | Freq: Once | ORAL | Status: DC
  Filled 2017-03-01: qty 2

## 2017-03-01 MED ORDER — ONDANSETRON 4 MG PO TBDP
4.0000 mg | ORAL_TABLET | Freq: Once | ORAL | Status: AC
  Administered 2017-03-01: 4 mg via ORAL
  Filled 2017-03-01: qty 1

## 2017-03-01 MED ORDER — ONDANSETRON HCL 4 MG/2ML IJ SOLN
4.0000 mg | Freq: Once | INTRAMUSCULAR | Status: AC
  Administered 2017-03-01: 4 mg via INTRAVENOUS

## 2017-03-01 MED ORDER — CEFDINIR 300 MG PO CAPS: 300.0000 mg | ORAL_CAPSULE | Freq: Two times a day (BID) | ORAL | 0 refills | Status: DC

## 2017-03-01 NOTE — Discharge Instructions (Signed)
Keep your appointment to see your primary doctor on Monday. You may follow up here as needed.

## 2017-03-01 NOTE — ED Triage Notes (Signed)
PT C/O: persistent cold sx .... Seen here on 12/05 and [facility]  ONSET: x2 months  SX ALSO INCLUDE: cough, wheezing, vomiting, fevers  TAKING MEDS:   A&O x4... NAD... Ambulatory

## 2017-03-01 NOTE — Discharge Instructions (Signed)
Please follow with your primary care doctor in the next 2 days for a check-up. They must obtain records for further management.  ° °Do not hesitate to return to the Emergency Department for any new, worsening or concerning symptoms.  ° °

## 2017-03-01 NOTE — ED Provider Notes (Signed)
[facility]   [id number] 03/01/17 Arrival Time: 1400  ASSESSMENT & PLAN:  1. Upper respiratory tract infection, unspecified type     Meds ordered this encounter  Medications  . cefdinir (OMNICEF) 300 MG capsule    Sig: Take 1 capsule (300 mg total) by mouth 2 (two) times daily.    Dispense:  20 capsule    Refill:  0   Discussed with caregiver possibility of sinus infection. Her two CXRs over the past two days without evidence of pneumonia. Will try a course of cefdinir.  She has f/u with her PCP in 48 hours.  OTC symptom care as needed.  Reviewed expectations re: course of current medical issues. Questions answered. Outlined signs and symptoms indicating need for more acute intervention. Patient verbalized understanding. After Visit Summary given.   SUBJECTIVE:  Lori SchickJasmine Miles is a 21 y.o. female who presents with complaint of nasal congestion, post-nasal drainage, and a persistent cough. Her mother reports symptoms intermittent over the past two months. Overall fatigued. SOB: none. Wheezing: moderate at times. Fever: no. Overall normal PO intake without n/v. Sick contacts: no. Does describe facial pressure now; increased nasal congestion. Recently completed ZPAK.  Social History   Tobacco Use  Smoking Status Never Smoker  Smokeless Tobacco Never Used   OTC treatment: None currently.  Recent visits to the ED for similar symptoms.  ROS: As per HPI.   OBJECTIVE:  Vitals:   03/01/17 1413  BP: 129/81  Pulse: (!) 107  Resp: 20  Temp: 98.5 F (36.9 C)  TempSrc: Oral  SpO2: 95%    General appearance: alert; no distress HEENT: nasal congestion; clear runny nose; throat irritation secondary to post-nasal drainage Neck: supple without LAD Lungs: clear to auscultation bilaterally; cough absent Skin: warm and dry Psychological: alert and cooperative; normal mood and affect  Recent Labs:  Results for orders placed or performed during the hospital  encounter of 03/01/17  Rapid strep screen  Result Value Ref Range   Streptococcus, Group A Screen (Direct) NEGATIVE NEGATIVE  Mononucleosis screen  Result Value Ref Range   Mono Screen NEGATIVE NEGATIVE  CBC with Differential  Result Value Ref Range   WBC 17.9 (H) 4.0 - 10.5 K/uL   RBC 3.77 (L) 3.87 - 5.11 MIL/uL   Hemoglobin 11.3 (L) 12.0 - 15.0 g/dL   HCT 82.934.4 (L) 56.236.0 - 13.046.0 %   MCV 91.2 78.0 - 100.0 fL   MCH 30.0 26.0 - 34.0 pg   MCHC 32.8 30.0 - 36.0 g/dL   RDW 86.514.1 78.411.5 - 69.615.5 %   Platelets 259 150 - 400 K/uL   Neutrophils Relative % 76 %   Neutro Abs 13.6 (H) 1.7 - 7.7 K/uL   Lymphocytes Relative 14 %   Lymphs Abs 2.5 0.7 - 4.0 K/uL   Monocytes Relative 10 %   Monocytes Absolute 1.8 (H) 0.1 - 1.0 K/uL   Eosinophils Relative 0 %   Eosinophils Absolute 0.0 0.0 - 0.7 K/uL   Basophils Relative 0 %   Basophils Absolute 0.0 0.0 - 0.1 K/uL  Urinalysis, Routine w reflex microscopic  Result Value Ref Range   Color, Urine YELLOW YELLOW   APPearance HAZY (A) CLEAR   Specific Gravity, Urine 1.032 (H) 1.005 - 1.030   pH 5.0 5.0 - 8.0   Glucose, UA 50 (A) NEGATIVE mg/dL   Hgb urine dipstick NEGATIVE NEGATIVE   Bilirubin Urine NEGATIVE NEGATIVE   Ketones, ur 5 (A) NEGATIVE mg/dL   Protein, ur NEGATIVE  NEGATIVE mg/dL   Nitrite NEGATIVE NEGATIVE   Leukocytes, UA NEGATIVE NEGATIVE  Comprehensive metabolic panel  Result Value Ref Range   Sodium 136 135 - 145 mmol/L   Potassium 4.3 3.5 - 5.1 mmol/L   Chloride 103 101 - 111 mmol/L   CO2 22 22 - 32 mmol/L   Glucose, Bld 156 (H) 65 - 99 mg/dL   BUN 10 6 - 20 mg/dL   Creatinine, Ser 9.140.97 0.44 - 1.00 mg/dL   Calcium 8.7 (L) 8.9 - 10.3 mg/dL   Total Protein 5.7 (L) 6.5 - 8.1 g/dL   Albumin 2.9 (L) 3.5 - 5.0 g/dL   AST 24 15 - 41 U/L   ALT 21 14 - 54 U/L   Alkaline Phosphatase 44 38 - 126 U/L   Total Bilirubin 0.4 0.3 - 1.2 mg/dL   GFR calc non Af Amer >60 >60 mL/min   GFR calc Af Amer >60 >60 mL/min   Anion gap 11 5 - 15    Lipase, blood  Result Value Ref Range   Lipase 19 11 - 51 U/L  I-Stat Beta hCG blood, ED ([facility], [facility], [facility] only)  Result Value Ref Range   I-stat hCG, quantitative <5.0 <5 mIU/mL   Comment 3            Recent Imaging:  Dg Chest 2 View  Result Date: 02/28/2017 CLINICAL DATA:  Cough and chills since December 5th. EXAM: CHEST  2 VIEW COMPARISON:  02/27/2017 FINDINGS: Low lung volumes with mild peribronchial thickening and increased interstitial lung markings suspicious for small airway inflammation, likely viral in etiology. No alveolar consolidation. Heart and mediastinal contours are stable. No acute osseous abnormality is seen. IMPRESSION: Low lung volumes with increased interstitial lung markings suspicious for small airway inflammation, likely viral in etiology. Electronically Signed   By: Tollie Ethavid  Kwon M.D.   On: 02/28/2017 21:10   Dg Chest 2 View  Result Date: 02/27/2017 CLINICAL DATA:  Cough and fever for several days EXAM: CHEST  2 VIEW COMPARISON:  11/06/2016 FINDINGS: Cardiac shadow is stable. The overall inspiratory effort is again poor with crowding of the vascular markings. No focal confluent infiltrate is seen. Some mild peribronchial changes are seen although these may be accentuated by the poor inspiratory effort. Alternatively it could represent a viral bronchitis. No bony abnormality is seen. The visualized upper abdomen is within normal limits. IMPRESSION: Mild prominent peribronchial markings although may be accentuated by the poor inspiratory effort. A viral etiology could not be excluded. No focal infiltrate is seen. Electronically Signed   By: Alcide CleverMark  Lukens M.D.   On: 02/27/2017 10:35     Allergies  Allergen Reactions  . Abilify [Aripiprazole] Swelling and Palpitations  . Seroquel [Quetiapine Fumarate] Palpitations  . Doxycycline Other (See Comments)    Stomach cramps  . Amoxicillin-Pot Clavulanate Hives  . Keppra [Levetiracetam] Other (See Comments)    Aggressive  behavior     Past Medical History:  Diagnosis Date  . Allergy   . Asthma   . Autism    mom states no actual dx, but admits she has some symptoms  . Bronchitis   . Constipation   . Frequency-urgency syndrome     some incontinence ; wears pull-ups  . GERD (gastroesophageal reflux disease)   . Interstitial cystitis   . Mental deficiency    hx of aggressive behavior,impaired speech   . Pneumonia   . PONV (postoperative nausea and vomiting)   . Seizure (HCC)  most recent sz " at the end of summer"-last sz years ago per mother  . Sleep apnea    needing to order CPAP    Family History  Problem Relation Age of Onset  . Seizures Mother   . Seizures Sister        1 sister has  . Pancreatic cancer Sister   . Colon cancer Neg Hx   . Colon polyps Neg Hx   . Esophageal cancer Neg Hx   . Rectal cancer Neg Hx   . Stomach cancer Neg Hx     Social History   Socioeconomic History  . Marital status: Single    Spouse name: Not on file  . Number of children: Not on file  . Years of education: Not on file  . Highest education level: Not on file  Social Needs  . Financial resource strain: Not on file  . Food insecurity - worry: Not on file  . Food insecurity - inability: Not on file  . Transportation needs - medical: Not on file  . Transportation needs - non-medical: Not on file  Occupational History  . Not on file  Tobacco Use  . Smoking status: Never Smoker  . Smokeless tobacco: Never Used  Substance and Sexual Activity  . Alcohol use: No    Alcohol/week: 0.0 oz  . Drug use: No  . Sexual activity: No    Birth control/protection: Pill  Other Topics Concern  . Not on file  Social History Narrative   Lives with mom Research scientist (physical sciences)) and half-sister Jerusha Reising) who is also mentally impaired.  Has CAP assistance, urinary incontinence, needs help with feeding,dressing, toileting   Graduated from [employer].  She enjoys playing with cars, dancing, and dogs.            Mardella Layman, MD 03/05/17 [phone]

## 2017-03-01 NOTE — ED Provider Notes (Signed)
[facility] EMERGENCY DEPARTMENT Provider Note   CSN: [id number] Arrival date & time: 02/28/17  1957     History   Chief Complaint Chief Complaint  Patient presents with  . Wheezing    HPI   Blood pressure 106/73, pulse 99, temperature 98.2 F (36.8 C), temperature source Oral, resp. rate 16, height 4\' 9"  (1.448 m), weight 85.3 kg (188 lb), SpO2 98 %.  Lori Miles is a 21 y.o. female with past medical history significant for asthma, autism and seizure disorder accompanied by mother who is her main caregiver and supplies most of the history is as patient is nonverbal, according to mother patient has been sick for weeks to months she is reporting tactile fever, vomiting phlegm, she has been treated with multiple courses of antibiotics and steroids for bronchitis.  Mother feels that she has a sore throat, no known diarrhea, constipation, issues with urination.  Level 5 caveat secondary to autism and nonverbal state at baseline  Past Medical History:  Diagnosis Date  . Allergy   . Asthma   . Autism    mom states no actual dx, but admits she has some symptoms  . Bronchitis   . Constipation   . Frequency-urgency syndrome     some incontinence ; wears pull-ups  . GERD (gastroesophageal reflux disease)   . Interstitial cystitis   . Mental deficiency    hx of aggressive behavior,impaired speech   . Pneumonia   . PONV (postoperative nausea and vomiting)   . Seizure (HCC)    most recent sz " at the end of summer"-last sz years ago per mother  . Sleep apnea    needing to order CPAP    Patient Active Problem List   Diagnosis Date Noted  . Obstructive sleep apnea 09/30/2016  . Encounter for gynecological examination with Papanicolaou smear of cervix 03/12/2016  . Excessive daytime sleepiness 03/05/2016  . Insomnia 10/04/2015  . Acute bronchitis 05/05/2015  . Dyspnea 05/05/2015  . Gait disorder 03/22/2015  . 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  Cough variant asthma vs UACS/ vcd  03/15/2015  . CAP (community acquired pneumonia) 02/14/2015  . Autism spectrum disorder 02/14/2015  . Acute respiratory failure (HCC) 02/14/2015  . Nausea & vomiting 02/14/2015  . Disturbance in sleep behavior 12/13/2014  . Obesity, morbid (HCC) 12/13/2014  . 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 Acanthosis nigricans 11/14/2014  . Toeing-in 08/29/2014  . Obesity (BMI 30-39.9) 08/23/2013  . 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 Interstitial cystitis 04/28/2012  . Recurrent urinary tract infection 08/01/2011  . Asthma 08/01/2011  . Chronic constipation 08/01/2011  . Contraception 08/01/2011    Past Surgical History:  Procedure Laterality Date  . CYSTOSCOPY    . CYSTOSCOPY WITH HYDRODISTENSION AND BIOPSY  04/14/2012   Procedure: CYSTOSCOPY/BIOPSY/HYDRODISTENSION;  Surgeon: Lindaann Slough, MD;  Location: [facility];  Service: Urology;;  instillation of marcaine and pyridium  . TONSILLECTOMY      OB History    No data available       Home Medications    Prior to Admission medications   Medication Sig Start Date End Date Taking? Authorizing Provider  acetaminophen (TYLENOL) 325 MG tablet Take 2 tablets (650 mg total) by mouth every 6 (six) hours  as needed for mild pain or moderate pain. 02/27/17  Yes Everlene Farrier, PA-C  albuterol (PROVENTIL) (2.5 MG/3ML) 0.083% nebulizer solution Inhale the contents of one vial in nebulizer every four to six hours as needed for cough or wheeze. 04/23/16  Yes Kozlow, Alvira Philips, MD  budesonide (PULMICORT) 1 MG/2ML nebulizer solution Take 2 mLs (1 mg total) by nebulization 2 (two) times daily. 07/23/16  Yes Kozlow, Alvira Philips, MD    cetirizine (ZYRTEC) 10 MG tablet Take one tablet daily for runny nose or itching. 01/27/17  Yes Kozlow, Alvira Philips, MD  cloNIDine (CATAPRES) 0.1 MG tablet take 1 and 1/2 tablet by mouth at bedtime 02/12/17  Yes Goodpasture, Inetta Fermo, NP  Cranberry 500 MG CAPS Take 500 mg by mouth daily. Reported on 09/07/2015   Yes [provider]  DEPAKOTE 500 MG DR tablet Take 1 tablet (500 mg total) by mouth 2 (two) times daily. 10/30/16  Yes Elveria Rising, NP  formoterol (PERFOROMIST) 20 MCG/2ML nebulizer solution Take 2 mLs (20 mcg total) by nebulization 2 (two) times daily. 04/25/16  Yes Kozlow, Alvira Philips, MD  hyoscyamine (LEVSIN SL) 0.125 MG SL tablet DISSOLVE 1 TABLET UNDER THE TONGUE 2 TIMES A DAY 08/08/14  Yes [provider]  Levonorgestrel-Ethinyl Estradiol (AMETHIA) 0.15-0.03 &0.01 MG tablet Take 1 tablet daily by mouth. 02/03/17  Yes Denney, Rachelle A, CNM  Melatonin 3 MG TABS Take 3 mg by mouth at bedtime. For insomnia   Yes [provider]  montelukast (SINGULAIR) 10 MG tablet Take one tablet each evening to prevent cough or wheeze. Patient taking differently: Take 10 mg by mouth at bedtime.  02/25/17  Yes Kozlow, Alvira Philips, MD  ondansetron (ZOFRAN) 4 MG tablet Take 1 tablet (4 mg total) by mouth every 6 (six) hours. 12/31/15  Yes Tharon Aquas, PA  predniSONE (DELTASONE) 20 MG tablet Take 2 tablets (40 mg total) by mouth daily. 02/27/17  Yes Everlene Farrier, PA-C  PULMICORT 0.5 MG/2ML nebulizer solution inhale contents of 1 vial in nebulizer twice a day MAY INCREASE T...  (REFER TO PRESCRIPTION NOTES). 07/23/16  Yes [provider]  ranitidine (ZANTAC) 150 MG capsule Take 1 capsule (150 mg total) 2 (two) times daily by mouth. 01/24/17  Yes Pyrtle, Carie Caddy, MD  senna (SENOKOT) 8.6 MG tablet Take 1 tablet by mouth daily.   Yes [provider]  torsemide (DEMADEX) 10 MG tablet Take 15 mg by mouth daily.    Yes [provider]  traZODone (DESYREL) 50 MG tablet Take  2 tablets (100 mg total) by mouth at bedtime. 11/07/16  Yes Elveria Rising, NP  azithromycin (ZITHROMAX) 250 MG tablet Take 1 tablet (250 mg total) by mouth daily. Take first 2 tablets together, then 1 every day until finished. Patient not taking: Reported on 03/01/2017 02/19/17   Mardella Layman, MD  benzonatate (TESSALON) 100 MG capsule Take 1-2 capsules (100-200 mg total) by mouth 3 (three) times daily as needed for cough. Patient not taking: Reported on 03/01/2017 12/25/16   Wallis Bamberg, PA-C  fluconazole (DIFLUCAN) 200 MG tablet TAKE 1 TABLET ONCE AS DIRECTED AND MAY REPEAT IN 48 TO 72 HOURS. Patient not taking: Reported on 03/01/2017 01/03/17   Brock Bad, MD    Family History Family History  Problem Relation Age of Onset  . Seizures Mother   . Seizures Sister        1 sister has  . Pancreatic cancer Sister   . Colon cancer Neg Hx   .  Colon polyps Neg Hx   . Esophageal cancer Neg Hx   . Rectal cancer Neg Hx   . Stomach cancer Neg Hx     Social History Social History   Tobacco Use  . Smoking status: Never Smoker  . Smokeless tobacco: Never Used  Substance Use Topics  . Alcohol use: No    Alcohol/week: 0.0 oz  . Drug use: No     Allergies   Abilify [aripiprazole]; Seroquel [quetiapine fumarate]; Doxycycline; Amoxicillin-pot clavulanate; and Keppra [levetiracetam]   Review of Systems Review of Systems  A complete review of systems was obtained and all systems are negative except as noted in the HPI and PMH.    Physical Exam Updated Vital Signs BP 111/62 (BP Location: Left Arm)   Pulse 82   Temp 98.2 F (36.8 C) (Oral)   Resp 15   Ht 4\' 9"  (1.448 m)   Wt 85.3 kg (188 lb)   SpO2 98%   BMI 40.68 kg/m   Physical Exam  Constitutional: She is oriented to person, place, and time. She appears well-developed and well-nourished. No distress.  HENT:  Head: Normocephalic and atraumatic.  Mouth/Throat: Oropharynx is clear and moist.  Eyes: Conjunctivae and  EOM are normal. Pupils are equal, round, and reactive to light.  Neck: Normal range of motion.  Cardiovascular: Normal rate, regular rhythm and intact distal pulses. Exam reveals no gallop and no friction rub.  No murmur heard. Pulmonary/Chest: Effort normal and breath sounds normal. No stridor. No respiratory distress. She has no wheezes. She has no rales. She exhibits no tenderness.  Abdominal: Soft. She exhibits no distension and no mass. There is no tenderness. There is no rebound and no guarding. No hernia.  Musculoskeletal: Normal range of motion.  Neurological: She is alert and oriented to person, place, and time.  Skin: Capillary refill takes less than 2 seconds. She is not diaphoretic.  Psychiatric: She has a normal mood and affect.  Nursing note and vitals reviewed.    ED Treatments / Results  Labs (all labs ordered are listed, but only abnormal results are displayed) Labs Reviewed  CBC WITH DIFFERENTIAL/PLATELET - Abnormal; Notable for the following components:      Result Value   WBC 17.9 (*)    RBC 3.77 (*)    Hemoglobin 11.3 (*)    HCT 34.4 (*)    Neutro Abs 13.6 (*)    Monocytes Absolute 1.8 (*)    All other components within normal limits  URINALYSIS, ROUTINE W REFLEX MICROSCOPIC - Abnormal; Notable for the following components:   APPearance HAZY (*)    Specific Gravity, Urine 1.032 (*)    Glucose, UA 50 (*)    Ketones, ur 5 (*)    All other components within normal limits  COMPREHENSIVE METABOLIC PANEL - Abnormal; Notable for the following components:   Glucose, Bld 156 (*)    Calcium 8.7 (*)    Total Protein 5.7 (*)    Albumin 2.9 (*)    All other components within normal limits  RAPID STREP SCREEN (NOT AT [facility])  CULTURE, BLOOD (ROUTINE X 2)  CULTURE, BLOOD (ROUTINE X 2)  URINE CULTURE  CULTURE, GROUP A STREP ([facility])  MONONUCLEOSIS SCREEN  LIPASE, BLOOD  I-STAT BETA HCG BLOOD, ED ([facility], [facility], [facility] ONLY)    EKG  EKG Interpretation None        Radiology Dg Chest 2 View  Result Date: 02/28/2017 CLINICAL DATA:  Cough and chills since December 5th. EXAM: CHEST  2 VIEW COMPARISON:  02/27/2017 FINDINGS: Low lung volumes with mild peribronchial thickening and increased interstitial lung markings suspicious for small airway inflammation, likely viral in etiology. No alveolar consolidation. Heart and mediastinal contours are stable. No acute osseous abnormality is seen. IMPRESSION: Low lung volumes with increased interstitial lung markings suspicious for small airway inflammation, likely viral in etiology. Electronically Signed   By: Tollie Ethavid  Kwon M.D.   On: 02/28/2017 21:10   Dg Chest 2 View  Result Date: 02/27/2017 CLINICAL DATA:  Cough and fever for several days EXAM: CHEST  2 VIEW COMPARISON:  11/06/2016 FINDINGS: Cardiac shadow is stable. The overall inspiratory effort is again poor with crowding of the vascular markings. No focal confluent infiltrate is seen. Some mild peribronchial changes are seen although these may be accentuated by the poor inspiratory effort. Alternatively it could represent a viral bronchitis. No bony abnormality is seen. The visualized upper abdomen is within normal limits. IMPRESSION: Mild prominent peribronchial markings although may be accentuated by the poor inspiratory effort. A viral etiology could not be excluded. No focal infiltrate is seen. Electronically Signed   By: Alcide CleverMark  Lukens M.D.   On: 02/27/2017 10:35    Procedures Procedures (including critical care time)  Medications Ordered in ED Medications  albuterol (PROVENTIL) (2.5 MG/3ML) 0.083% nebulizer solution (  Not Given 03/01/17 0257)  acetaminophen (TYLENOL) tablet 650 mg (0 mg Oral Hold 03/01/17 0042)  albuterol (PROVENTIL) (2.5 MG/3ML) 0.083% nebulizer solution 5 mg (5 mg Nebulization Given 02/28/17 2020)  albuterol (PROVENTIL) (2.5 MG/3ML) 0.083% nebulizer solution 2.5 mg (2.5 mg Nebulization Given 02/28/17 2355)  ondansetron (ZOFRAN-ODT)  disintegrating tablet 4 mg (4 mg Oral Given 03/01/17 0021)  ondansetron (ZOFRAN) injection 4 mg (4 mg Intravenous Given 03/01/17 0037)     Initial Impression / Assessment and Plan / ED Course  I have reviewed the triage vital signs and the nursing notes.  Pertinent labs & imaging results that were available during my care of the patient were reviewed by me and considered in my medical decision making (see chart for details).     Vitals:   03/01/17 0445 03/01/17 0600 03/01/17 0630 03/01/17 0633  BP: 112/66 (!) 107/59 103/71 111/62  Pulse: (!) 112 (!) 108 (!) 110 82  Resp:    15  Temp:      TempSrc:      SpO2: 97% 97% 97% 98%  Weight:      Height:        Medications  albuterol (PROVENTIL) (2.5 MG/3ML) 0.083% nebulizer solution (  Not Given 03/01/17 0257)  acetaminophen (TYLENOL) tablet 650 mg (0 mg Oral Hold 03/01/17 0042)  albuterol (PROVENTIL) (2.5 MG/3ML) 0.083% nebulizer solution 5 mg (5 mg Nebulization Given 02/28/17 2020)  albuterol (PROVENTIL) (2.5 MG/3ML) 0.083% nebulizer solution 2.5 mg (2.5 mg Nebulization Given 02/28/17 2355)  ondansetron (ZOFRAN-ODT) disintegrating tablet 4 mg (4 mg Oral Given 03/01/17 0021)  ondansetron (ZOFRAN) injection 4 mg (4 mg Intravenous Given 03/01/17 0037)    Lori Miles is 21 y.o. female presenting with cough, persistent wheezing, unclear if she is vomiting or coughing up phlegm.  Abdominal exam is benign, lung sounds clear.  Chest x-ray with viral infection.  Mother is very concerned that something is severely wrong with her child.  She states that she is not herself, not active as she normally would be.  She has had a persistent issue over the course of the last several months.  They have recently been able to establish primary care.  Patient with high white count likely secondary to steroid use.   Patient tolerating p.o.'s in the ED.  Evaluation does not show pathology that would require ongoing emergent intervention or inpatient  treatment. Pt is hemodynamically stable and mentating appropriately. Discussed findings and plan with patient/guardian, who agrees with care plan. All questions answered. Return precautions discussed and outpatient follow up given.   Final Clinical Impressions(s) / ED Diagnoses   Final diagnoses:  Viral URI  Bronchitis    ED Discharge Orders    None       Lori Miles, Mardella Laymanicole, PA-C 03/01/17 0700    Geoffery Lyonselo, [place], MD 03/01/17 [phone]

## 2017-03-01 NOTE — ED Notes (Signed)
ED Provider at bedside. 

## 2017-03-01 NOTE — ED Notes (Signed)
Mother reports daughter has not gotten sick after receiving the PO fluids.

## 2017-03-02 LAB — URINE CULTURE

## 2017-03-03 ENCOUNTER — Ambulatory Visit (INDEPENDENT_AMBULATORY_CARE_PROVIDER_SITE_OTHER): Payer: Medicare Other | Admitting: Internal Medicine

## 2017-03-03 ENCOUNTER — Encounter: Payer: Self-pay | Admitting: Internal Medicine

## 2017-03-03 VITALS — BP 110/80 | HR 77 | Temp 97.5°F | Ht <= 58 in | Wt 190.0 lb

## 2017-03-03 VITALS — BP 118/70 | HR 87 | Ht <= 58 in | Wt 191.2 lb

## 2017-03-03 DIAGNOSIS — J4530 Mild persistent asthma, uncomplicated: Secondary | ICD-10-CM | POA: Diagnosis not present

## 2017-03-03 DIAGNOSIS — J209 Acute bronchitis, unspecified: Secondary | ICD-10-CM | POA: Diagnosis not present

## 2017-03-03 DIAGNOSIS — F840 Autistic disorder: Secondary | ICD-10-CM

## 2017-03-03 DIAGNOSIS — K5909 Other constipation: Secondary | ICD-10-CM | POA: Diagnosis not present

## 2017-03-03 DIAGNOSIS — F819 Developmental disorder of scholastic skills, unspecified: Secondary | ICD-10-CM | POA: Diagnosis not present

## 2017-03-03 DIAGNOSIS — G40309 Generalized idiopathic epilepsy and epileptic syndromes, not intractable, without status epilepticus: Secondary | ICD-10-CM | POA: Diagnosis not present

## 2017-03-03 DIAGNOSIS — J45991 Cough variant asthma: Secondary | ICD-10-CM

## 2017-03-03 DIAGNOSIS — Z23 Encounter for immunization: Secondary | ICD-10-CM

## 2017-03-03 LAB — CULTURE, GROUP A STREP (THRC)

## 2017-03-03 MED ORDER — PREDNISONE 20 MG PO TABS: ORAL_TABLET | ORAL | 0 refills | Status: DC

## 2017-03-03 NOTE — Telephone Encounter (Signed)
Spoke with Lori Miles, advised pt has an appt tomorrow with MR. She will call them to remind them but this is probably what is needed for them to start CPAP again. [facility] since she has an appt tomorrow.

## 2017-03-03 NOTE — Progress Notes (Signed)
   Subjective:    Patient ID: Lori SchickJasmine Miles, female    DOB: 1995/03/29, 21 y.o.   MRN: [id number]  HPI The patient is a new 21 YO female coming in for problems with sinuses and breathing. Her mother is here as well and helps with history. 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 She does have chronic breathing problems and recently took short dose of prednisone which did not help. Coughing for the last 3 weeks and SOB. She is not eating well and vomiting with coughing. She is using tessalon perles for cough which helps some. She has albuterol and pulmicort nebulizer solution. She just started omnicef about 2 days ago and this is helping some. She does have some chills but denies fevers. Muscle aches and fatigue as well. She is also wanting continuation of medical care including seizure disorder (seeing neurology and severe seizure disorder which they feel has negatively impacted her IQ, taking depakote at this time without recent seizure, they feel that her coughing too much can induce a seizure), and her blood pressure (she is taking torsemide and clonidine and BP at goal, recent labs at the ER stable renal function).   [facility], [facility] reviewed and updated.   Review of Systems  Unable to perform ROS: Mental status change  Constitutional: Positive for activity change, appetite change, chills and fatigue. Negative for fever and unexpected weight change.  HENT: Positive for congestion, ear pain, postnasal drip, rhinorrhea, sinus pressure and sore throat. Negative for ear discharge, sinus pain, sneezing, tinnitus, trouble swallowing and voice change.   Eyes: Negative.   Respiratory: Positive for cough and shortness of breath. Negative for chest tightness and wheezing.   Cardiovascular: Negative.   Gastrointestinal: Positive for constipation, nausea and vomiting. Negative for abdominal distention, abdominal pain and anal bleeding.  Musculoskeletal: Positive for  myalgias. Negative for arthralgias, back pain and gait problem.  Skin: Negative.   Neurological: Negative.   Psychiatric/Behavioral: Positive for behavioral problems, decreased concentration and dysphoric mood.       Stable from prior      Objective:   Physical Exam  Constitutional: She appears well-developed and well-nourished.  Overweight  HENT:  Head: Normocephalic and atraumatic.  Ears with bulging TM but clear fluid, oropharynx with redness, turbinates swollen.   Eyes: EOM are normal.  Neck: Normal range of motion. No JVD present.  Cardiovascular: Normal rate and regular rhythm.  Pulmonary/Chest: Effort normal and breath sounds normal. No respiratory distress. She has no wheezes. She has no rales.  Abdominal: Soft. Bowel sounds are normal. She exhibits no distension and no mass. There is no tenderness. There is no rebound and no guarding.  Musculoskeletal: She exhibits no edema.  Lymphadenopathy:    She has no cervical adenopathy.  Neurological: She is alert. Coordination normal.  Skin: Skin is warm and dry.  Psychiatric:  Not able to answer questions accurately and has a hard time following the conversation, mom provides info but is also a wandering historian.    Vitals:   03/03/17 0920  BP: 110/80  Pulse: 77  Temp: (!) 97.5 F (36.4 C)  TempSrc: Oral  SpO2: 98%  Weight: 190 lb (86.2 kg)  Height: 4\' 9"  (1.448 m)      Assessment & Plan:  Tdap given at visit

## 2017-03-03 NOTE — Assessment & Plan Note (Signed)
Rx for prednisone taper today and continue omnicef. No indicaiton for extension or broadening of antibiotics today. Will establish with pulmonary today for management of her ongoing asthma/breathing concerns. She is taking singulair and has albuterol and pulmicort and formoterol nebulizer. They are concerned that she has OSA and is unable to get CPAP paid for due to some settings problem. They will ask pulmonary today. Recommended call back with worsening or problems.

## 2017-03-03 NOTE — Assessment & Plan Note (Signed)
BMI 41 and not able to discuss today due to sick visit.

## 2017-03-03 NOTE — Addendum Note (Signed)
Addended by: Berton LanGULCH, BRIANA R on: 03/03/2017 11:24 AM   Modules accepted: Orders

## 2017-03-03 NOTE — Assessment & Plan Note (Signed)
Establishing with pulmonary today, right now seeing allergy/asthma specialist. Taking pulmicort, formoterol, albuterol nebs and singulair. No overt wheezing today but some rhonchi which clear mostly with coughing.

## 2017-03-03 NOTE — Progress Notes (Signed)
Subjective:    Patient ID: Lori Miles, female    DOB: 1995-12-25, 21 y.o.   MRN: [id number]  HPI   Synopsis: History of asthma in the setting of underlying autism. She has been evaluated by asthma and allergy in the past. She has also seen to pulmonologist at our group.  HPI  Chief Complaint  Patient presents with  . Follow-up    pt still has runny nose, wheezing, prod cough with mucus sometimes tinged with mucus- cough going until pt vomits.     Lori Miles has bene struggling with cough. She will have a lot of chest congestion, sometimes green to bloody, it can come up her nose from time to time. She will have periods of this that are more severe. Her mother says that antibiotics will help. She has had pneumonia in the past, last was around November.  She was admitted for the same. Her heart rate has been elevated as well.  Apparently she has asthma but with regurgitation lately she has been having more asthma lately.  Still takes pulmicort bid Still takes albuterol every four hours.    03/15/2015 1st [facility] office visit/ [facility]  On pulmicort 0.5 qid and prn saba hasn't used latter x 24 h Chief Complaint  Patient presents with  . Pulmonary Consult    Hospital discharge stating pt had pneumonia. Mother states breathing problems since August 2016. Mother states pt increased SOB, wheezing, and "gasping for air" at night   had been on chronic prednisone x months but apparently no more pred since early December 2016 (mother an nurse  held up bottle of doxy confused with pred, and singulair confused with gerd rx) - noct wheeze no better on saba per mother. When pt is asked if she's trouble breathing she nods know, when asked where she has pain she point to her neck (middle to upper) and mother does report severe dry coughing fits esp supine  The mother, the main caretaker, failed  to answer a single question asked in a straightforward manner, tending to go off on tangents or  answer questions with ambiguous medical terms or diagnoses and seemed aggravated to the point of anger when asked the same question more than once for clarification.   As far as I could tell through the mother >> No obvious  patterns in day to day or daytime variabilty or assoc excess/ purulent sputum or mucus plugs  or cp or chest tightness, subjective wheeze overt sinus or hb symptoms. No unusual exp hx or h/o childhood pna/ asthma or knowledge of premature birth.  Also denies any obvious fluctuation of symptoms with weather or environmental changes or other aggravating or alleviating factors except as outlined above    07/06/15  Follow up : Cough and wheezing   Pt presents with mother and family .  She was recently seen last month for progressive dyspena.  She has been having trouble for several months with ongoing cough, sob , decreased activity level. Mom says her normal activity is she plays and runs at school but for several months does not want to do anything but sit around . She will walk for a distance then just sit down in floor to rest.  She is special needs with Cerebral palsy and autism with seizure disorder. She is followed by neuro on depakote.  Walk test in office today with no desats but HR goes up to 140 with walking.   She was set up for a CT chest (  D dimer was elevated ) that was neg for PE . No acute process .  BNP was normal  She was referred to cardiology for tachycardia. She has been set up for an Echo.  This was normal with EF 55-60%. trival TV regurg.   Seen in ER 05/28/15 for nose bleeding , recommended to have saline nasal rinses.  She returns to our pulmonary clinic on 06/16/15 with recommendations for spirometry and feno testing but was unable to conmplete, ? Reason why as she has done spirometry in past.  Went back to ER on 07/01/15 for nasal congestioin , rx Omnicef for 10 days  She returns today with mother and step dad. She is some better but still has ongoing  nasal symptoms, cough and intermittent wheezing. Discussed with mother that taking too many antibiotics and steroids is not a good idea if not indicated as many side effects. Her weight has increased a lot over last year.  Complains that nose feels stopped up . Gets winded and has cough intermittently .  Denies chest pain, orthopnea ,edema or fever.    OV 03/03/2017  Chief Complaint  Patient presents with  . Advice Only    Pt is re-establishing with our practice. Pt was recently in the ED 03/01/17 due to URI.  Pt is currently on prednisone due to bronchitis. Has complaints of cough, SOB, fatigue.    This unfortunate 21 year old obese autistic [fairly significant autism] African-American female who presents with her mother.  They are trying to reestablish care for asthma.  The patient gives no history because of her autism.  The mother gives some of the history and she is a poor historian but it appears that currently patient is stable but might be coming down with a bronchitis.  The question of asthma versus VCD exists.  No one has ever been able to rule out asthma because patient has never been able to comply with pulmonary function testing or methacholine challenge test on nitric oxide testing.  In 2018 review of the chart shows that her allergy workup and IgE were negative/normal.  Mother wants prescription for her CPAP.  She does not want any antibiotic or prednisone at this visit.    Review of Systems  Constitutional: Positive for fever. Negative for unexpected weight change.  HENT: Positive for congestion, ear pain, postnasal drip, sinus pressure, sneezing, sore throat and trouble swallowing. Negative for dental problem and rhinorrhea.   Eyes: Positive for redness. Negative for itching.  Respiratory: Positive for cough, chest tightness, shortness of breath and wheezing.   Cardiovascular: Positive for palpitations and leg swelling.  Gastrointestinal: Positive for nausea and vomiting.    Genitourinary: Negative for dysuria.  Musculoskeletal: Negative for joint swelling.  Skin: Negative for rash.  Allergic/Immunologic: Positive for environmental allergies. Negative for food allergies and immunocompromised state.  Neurological: Negative for headaches.  Hematological: Does not bruise/bleed easily.  Psychiatric/Behavioral: Negative for dysphoric mood. The patient is not nervous/anxious.      has a past medical history of Allergy, Asthma, Autism, Bronchitis, Constipation, Frequency-urgency syndrome, GERD (gastroesophageal reflux disease), Interstitial cystitis, Mental deficiency, Pneumonia, PONV (postoperative nausea and vomiting), Seizure (HCC), and Sleep apnea.   reports that  has never smoked. she has never used smokeless tobacco.  Past Surgical History:  Procedure Laterality Date  . CYSTOSCOPY    . CYSTOSCOPY WITH HYDRODISTENSION AND BIOPSY  04/14/2012   Procedure: CYSTOSCOPY/BIOPSY/HYDRODISTENSION;  Surgeon: Lindaann Slough, MD;  Location: [facility] [place];  Service: Urology;;  instillation of  marcaine and pyridium  . TONSILLECTOMY      Allergies  Allergen Reactions  . Abilify [Aripiprazole] Swelling and Palpitations  . Seroquel [Quetiapine Fumarate] Palpitations  . Doxycycline Other (See Comments)    Stomach cramps  . Amoxicillin-Pot Clavulanate Hives  . Keppra [Levetiracetam] Other (See Comments)    Aggressive behavior     Immunization History  Administered Date(s) Administered  . Pneumococcal Polysaccharide-23 08/06/2016  . Tdap 03/03/2017    Family History  Problem Relation Age of Onset  . Seizures Mother   . Seizures Sister        1 sister has  . Pancreatic cancer Sister   . Colon cancer Neg Hx   . Colon polyps Neg Hx   . Esophageal cancer Neg Hx   . Rectal cancer Neg Hx   . Stomach cancer Neg Hx      Current Outpatient Medications:  .  acetaminophen (TYLENOL) 325 MG tablet, Take 2 tablets (650 mg total) by mouth every 6 (six)  hours as needed for mild pain or moderate pain., Disp: 60 tablet, Rfl: 0 .  albuterol (PROVENTIL) (2.5 MG/3ML) 0.083% nebulizer solution, Inhale the contents of one vial in nebulizer every four to six hours as needed for cough or wheeze., Disp: 120 mL, Rfl: 1 .  benzonatate (TESSALON) 100 MG capsule, Take 1-2 capsules (100-200 mg total) by mouth 3 (three) times daily as needed for cough., Disp: 60 capsule, Rfl: 0 .  budesonide (PULMICORT) 1 MG/2ML nebulizer solution, Take 2 mLs (1 mg total) by nebulization 2 (two) times daily., Disp: 120 mL, Rfl: 5 .  cefdinir (OMNICEF) 300 MG capsule, Take 1 capsule (300 mg total) by mouth 2 (two) times daily., Disp: 20 capsule, Rfl: 0 .  cetirizine (ZYRTEC) 10 MG tablet, Take one tablet daily for runny nose or itching., Disp: 30 tablet, Rfl: 0 .  cloNIDine (CATAPRES) 0.1 MG tablet, take 1 and 1/2 tablet by mouth at bedtime, Disp: 45 tablet, Rfl: 2 .  Cranberry 500 MG CAPS, Take 500 mg by mouth daily. Reported on 09/07/2015, Disp: , Rfl:  .  DEPAKOTE 500 MG DR tablet, Take 1 tablet (500 mg total) by mouth 2 (two) times daily., Disp: 60 tablet, Rfl: 5 .  fluconazole (DIFLUCAN) 200 MG tablet, TAKE 1 TABLET ONCE AS DIRECTED AND MAY REPEAT IN 48 TO 72 HOURS., Disp: 3 tablet, Rfl: 4 .  formoterol (PERFOROMIST) 20 MCG/2ML nebulizer solution, Take 2 mLs (20 mcg total) by nebulization 2 (two) times daily., Disp: 120 mL, Rfl: 5 .  hyoscyamine (LEVSIN SL) 0.125 MG SL tablet, DISSOLVE 1 TABLET UNDER THE TONGUE 2 TIMES A DAY, Disp: , Rfl: 0 .  Melatonin 3 MG TABS, Take 3 mg by mouth at bedtime. For insomnia, Disp: , Rfl:  .  montelukast (SINGULAIR) 10 MG tablet, Take one tablet each evening to prevent cough or wheeze. (Patient taking differently: Take 10 mg by mouth at bedtime. ), Disp: 30 tablet, Rfl: 2 .  ondansetron (ZOFRAN) 4 MG tablet, Take 1 tablet (4 mg total) by mouth every 6 (six) hours., Disp: 12 tablet, Rfl: 0 .  predniSONE (DELTASONE) 20 MG tablet, Days 1-4 take 3  pills daily, days 5-8 take 2 pills daily, days 9-12 take 1 pill daily., Disp: 26 tablet, Rfl: 0 .  PULMICORT 0.5 MG/2ML nebulizer solution, inhale contents of 1 vial in nebulizer twice a day MAY INCREASE T...  (REFER TO PRESCRIPTION NOTES)., Disp: , Rfl: 0 .  ranitidine (ZANTAC) 150 MG capsule, Take  1 capsule (150 mg total) 2 (two) times daily by mouth., Disp: 180 capsule, Rfl: 3 .  senna (SENOKOT) 8.6 MG tablet, Take 1 tablet by mouth daily., Disp: , Rfl:  .  torsemide (DEMADEX) 10 MG tablet, Take 15 mg by mouth daily. , Disp: , Rfl:  .  traZODone (DESYREL) 50 MG tablet, Take 2 tablets (100 mg total) by mouth at bedtime., Disp: 62 tablet, Rfl: 5 .  Levonorgestrel-Ethinyl Estradiol (AMETHIA) 0.15-0.03 &0.01 MG tablet, Take 1 tablet daily by mouth. (Patient not taking: Reported on 03/03/2017), Disp: 1 Package, Rfl: 4     Objective:   Physical Exam  Obese female lying down on the bed.  Friendly manner but clearly autistic.  No wheeze no crackles no respiratory distress.  Mallampati class IV no edema skin appears intact on the exposed areas  Vitals:   03/03/17 1536  BP: 118/70  Pulse: 87  SpO2: 96%  Weight: 191 lb 3.2 oz (86.7 kg)  Height: 4' 9.5" (1.461 m)         Assessment & Plan:     ICD-10-CM   1. Cough variant asthma vs UACS/ vcd  J45.991   2. Morbid obesity, unspecified obesity type (HCC) E66.01   3. Autism F84.0        Stable Continue nebulizers as before restablish with sleep doc for cpap  followup 3 months or sooner if needed   Dr. Kalman Shan, M.D., [facility].C.P Pulmonary and Critical Care Medicine Staff Physician, [facility] Center Director - Interstitial Lung Disease  Program  Pulmonary Fibrosis [facility] at [facility] [place], [place], [address]  Pager: [phone], If no answer or between  15:00h - 7:00h: call [phone] Telephone: [phone]

## 2017-03-03 NOTE — Patient Instructions (Signed)
We have sent in prednisone to take 3 pills daily for days 1-4, 2 pills daily for days 5-8, 1 pill daily for days 9-12.

## 2017-03-03 NOTE — Patient Instructions (Addendum)
ICD-10-CM   1. Cough variant asthma vs UACS/ vcd  J45.991   2. Morbid obesity, unspecified obesity type (HCC) E66.01   3. Autism F84.0     Stable Continue nebulizers as before restablish with sleep doc for cpap  followup 3 months or sooner if needed

## 2017-03-03 NOTE — Assessment & Plan Note (Signed)
Seeing neurology and is on depakote with no recent seizures.

## 2017-03-03 NOTE — Assessment & Plan Note (Signed)
Seeing GI as well and has hyoscamine for discomfort. Likely some due to inactivity and diet. No time today for in depth discussion but will talk next time more.

## 2017-03-03 NOTE — Assessment & Plan Note (Signed)
She is not able to help much with history as mom states she will answer questions but not accurately. She has guardian (mother) who she lives with. Mom states she is not able to prepare food, sometimes incontinence. Will get into ability further at next visit.

## 2017-03-03 NOTE — Assessment & Plan Note (Signed)
Unable to assess today.

## 2017-03-04 ENCOUNTER — Telehealth: Payer: Self-pay | Admitting: Internal Medicine

## 2017-03-04 NOTE — Telephone Encounter (Signed)
Pt was re-established with our practice by MR to follow up on cough and bronchitis-like symptoms after being dismissed from the practice by BQ due to issues with pt's mother.  With the consult with MR on 03/03/17, pt's mother stated that they have been having issues trying to get CPAP supplies.   Pt is not currently established with a sleep doctor and needs to get re-established with one.  Have spoken with VS to see if he would accept the pt to get her re-established with a sleep doctor.  Spoke with VS briefly about the pt and why the pt was dismissed from the practice by BQ, but VS wants BQ to tell him more about the situation that happened between him and the mother before he agrees to have the pt re-established with him as her new sleep doctor.  BQ, please advise on this information.  Thanks!

## 2017-03-05 ENCOUNTER — Telehealth: Payer: Self-pay | Admitting: Nurse Practitioner

## 2017-03-05 ENCOUNTER — Emergency Department (HOSPITAL_COMMUNITY): Payer: Medicare Other

## 2017-03-05 ENCOUNTER — Telehealth: Payer: Self-pay | Admitting: Internal Medicine

## 2017-03-05 ENCOUNTER — Emergency Department (HOSPITAL_COMMUNITY)
Admission: EM | Admit: 2017-03-05 | Discharge: 2017-03-05 | Disposition: A | Discharge status: Home / Self Care | Payer: Medicare Other | Attending: Emergency Medicine | Admitting: Emergency Medicine

## 2017-03-05 ENCOUNTER — Encounter (HOSPITAL_COMMUNITY): Payer: Self-pay | Admitting: *Deleted

## 2017-03-05 ENCOUNTER — Other Ambulatory Visit: Payer: Self-pay

## 2017-03-05 ENCOUNTER — Telehealth: Payer: Self-pay | Admitting: Allergy and Immunology

## 2017-03-05 DIAGNOSIS — J45909 Unspecified asthma, uncomplicated: Secondary | ICD-10-CM | POA: Insufficient documentation

## 2017-03-05 DIAGNOSIS — Z79899 Other long term (current) drug therapy: Secondary | ICD-10-CM | POA: Diagnosis not present

## 2017-03-05 DIAGNOSIS — J069 Acute upper respiratory infection, unspecified: Secondary | ICD-10-CM | POA: Diagnosis not present

## 2017-03-05 DIAGNOSIS — F840 Autistic disorder: Secondary | ICD-10-CM | POA: Diagnosis not present

## 2017-03-05 DIAGNOSIS — R05 Cough: Secondary | ICD-10-CM | POA: Diagnosis present

## 2017-03-05 LAB — CBC
HEMATOCRIT: 37.1 % (ref 36.0–46.0)
HEMOGLOBIN: 12.2 g/dL (ref 12.0–15.0)
MCH: 30.3 pg (ref 26.0–34.0)
MCHC: 32.9 g/dL (ref 30.0–36.0)
MCV: 92.1 fL (ref 78.0–100.0)
Platelets: 283 10*3/uL (ref 150–400)
RBC: 4.03 MIL/uL (ref 3.87–5.11)
RDW: 13.9 % (ref 11.5–15.5)
WBC: 28.1 10*3/uL — ABNORMAL HIGH (ref 4.0–10.5)

## 2017-03-05 LAB — COMPREHENSIVE METABOLIC PANEL
ALBUMIN: 2.7 g/dL — AB (ref 3.5–5.0)
ALK PHOS: 50 U/L (ref 38–126)
ALT: 24 U/L (ref 14–54)
ANION GAP: 10 (ref 5–15)
AST: 22 U/L (ref 15–41)
BILIRUBIN TOTAL: 0.4 mg/dL (ref 0.3–1.2)
BUN: 15 mg/dL (ref 6–20)
CALCIUM: 8.3 mg/dL — AB (ref 8.9–10.3)
CO2: 27 mmol/L (ref 22–32)
Chloride: 97 mmol/L — ABNORMAL LOW (ref 101–111)
Creatinine, Ser: 0.94 mg/dL (ref 0.44–1.00)
GLUCOSE: 190 mg/dL — AB (ref 65–99)
POTASSIUM: 4 mmol/L (ref 3.5–5.1)
Sodium: 134 mmol/L — ABNORMAL LOW (ref 135–145)
TOTAL PROTEIN: 5.5 g/dL — AB (ref 6.5–8.1)

## 2017-03-05 MED ORDER — IPRATROPIUM-ALBUTEROL 0.5-2.5 (3) MG/3ML IN SOLN
3.0000 mL | Freq: Once | RESPIRATORY_TRACT | Status: AC
  Administered 2017-03-05: 3 mL via RESPIRATORY_TRACT
  Filled 2017-03-05: qty 3

## 2017-03-05 MED ORDER — BUDESONIDE 1 MG/2ML IN SUSP: 1.0000 mg | Freq: Two times a day (BID) | RESPIRATORY_TRACT | 5 refills | Status: DC

## 2017-03-05 NOTE — Discharge Instructions (Signed)
The chest x-ray does not show pneumonia.  Her white blood cell count was elevated, likely due to the steroid burst that she has been taking.  Please inform your pulmonologist.  Continue treating her with Ceftin ear antibiotic, steroid taper and albuterol treatments as needed.  Continue to use nasal bulb syringe to help clear the nasal secretions.  Return to the emergency department if your daughter appears to be having trouble breathing or has vomiting that will not stop.

## 2017-03-05 NOTE — Telephone Encounter (Signed)
Patients mother informed of MD response

## 2017-03-05 NOTE — Telephone Encounter (Signed)
If she cannot walk needs ER visit. Should finish antibiotic and prednisone then re-evaluate if not better. If worsening should call pulmonary to see if they can offer her other treatment.

## 2017-03-05 NOTE — Telephone Encounter (Signed)
If she is that weak, should go to ER

## 2017-03-05 NOTE — Telephone Encounter (Signed)
Pt was re-established with our practice by MR to follow up on cough and bronchitis-like symptoms after being dismissed from the practice by BQ due to issues with pt's mother.  With the consult with MR on 03/03/17, pt's mother stated that they have been having issues trying to get CPAP supplies.   Pt is not currently established with a sleep doctor and needs to get re-established with one.  Have spoken with VS to see if he would accept the pt to get her re-established with a sleep doctor.  Spoke with VS briefly about the pt and why the pt was dismissed from the practice by BQ, but VS wants BQ to tell him more about the situation that happened between him and the mother before he agrees to have the pt re-established with him as her new sleep doctor.  BQ, please advise on this information.  Thanks! 

## 2017-03-05 NOTE — Telephone Encounter (Signed)
Prescription has been sent in  

## 2017-03-05 NOTE — Telephone Encounter (Signed)
Patient mother returning call - she can be reached at [phone] -pr

## 2017-03-05 NOTE — Telephone Encounter (Signed)
Copied from CRM [phone]. Topic: General - Other >> Mar 05, 2017 10:31 AM Viviann SpareWhite, Selina wrote: Reason for CRM: Patient mother is requesting a call back, the patient was in the office to see Dr. Okey Duprerawford on Monday and she is not doing any better. Patient mother is requesting a call back asap @ [phone]

## 2017-03-05 NOTE — ED Triage Notes (Signed)
The pt has asthma  With difficulty breathing since dec 1st cold cough vomiting  lmp   none

## 2017-03-05 NOTE — Telephone Encounter (Signed)
Spoke with pt's mother, [name]. States that pt is still not feeling well. Reports SOB, congestion, fever and cough. [name] states that the pt almost fell in the bathroom this morning due to being so weak. She is wanting to know if MR has any other recommendations.  MR - please advise. Thanks.

## 2017-03-05 NOTE — Telephone Encounter (Signed)
Called and spoke to pt's mother. Informed her of the recs per MR. [name] states they are already at the ER. Nothing further needed.   Will forward to MR as FYI.

## 2017-03-05 NOTE — Telephone Encounter (Signed)
Attempted to call pt's mother, [name]. She did not answer and I could not leave a message due to her voicemail being full. Will try back.

## 2017-03-05 NOTE — Telephone Encounter (Signed)
Patient's mother calling. She needs to discuss getting a CPAP. I advised she needs an appointment since has been over a year. She said she wants to leave a message for Aundra MilletMegan because she has discussed patient with her before.

## 2017-03-05 NOTE — Telephone Encounter (Signed)
Mom called and said that the pulmicort was not called in yesterday and needs it sent to [facility] on [address].

## 2017-03-05 NOTE — Telephone Encounter (Signed)
Patient and mother came to see MR for a consult 03/04/17.  Pt needs to be re-established with a sleep doctor for further help with cpap.  Have spoken with VS to see if he will accept patient and waiting to hear from vs. Will go ahead and close this encounter as a new one was created 03/04/17

## 2017-03-05 NOTE — ED Provider Notes (Signed)
[facility] Provider Note   CSN: [id number] Arrival date & time: 03/05/17  1738     History   Chief Complaint Chief Complaint  Patient presents with  . Asthma    HPI Lori Miles is a 21 y.o. female.  HPI  Lori Miles is a 21 year old female with a history of asthma, autism and seizure disorder accompanied by her mother who is her main caregiver. Mother provides the history as patient is nonverbal.  She states that patient has had a cough, wheezing, congestion, tactile fever and vomiting mucus for about a month now.  She has been seen in the emergency department twice and urgent care twice over the past month. Was seen by pulmonologist two days ago. She has taken multiple rounds of antibiotics including a Z-Pak and currently taking cefdinir.  She is currently taking 60mg  prednisone daily.  Mother states that she is also given her child Tessalon Perles and Zyrtec.  States that she has used albuterol nebulizer and Pulmicort at home with mild relief. States that patient was diagnosed with pneumonia in the past and she wants to make sure that her daughter does not have this currently.  States that patient's cough is productive of a green/yellow/brown phlegm.  Denies shortness of breath.  Denies sick contacts.  States that her daughter has normal p.o. fluid intake.  Level 5 caveat applies given patient autism and nonverbal.  Past Medical History:  Diagnosis Date  . Allergy   . Asthma   . Autism    mom states no actual dx, but admits she has some symptoms  . Bronchitis   . Constipation   . Frequency-urgency syndrome     some incontinence ; wears pull-ups  . GERD (gastroesophageal reflux disease)   . Interstitial cystitis   . Mental deficiency    hx of aggressive behavior,impaired speech   . Pneumonia   . PONV (postoperative nausea and vomiting)   . Seizure (HCC)    most recent sz " at the end of summer"-last sz years ago per mother  .  Sleep apnea    needing to order CPAP    Patient Active Problem List   Diagnosis Date Noted  . Obstructive sleep apnea 09/30/2016  . Excessive daytime sleepiness 03/05/2016  . Acute bronchitis 05/05/2015  . Gait disorder 03/22/2015  . 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 Cough variant asthma vs UACS/ vcd  03/15/2015  . Autism spectrum disorder 02/14/2015  . Nausea & vomiting 02/14/2015  . Obesity, morbid (HCC) 12/13/2014  . 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 Acanthosis nigricans 11/14/2014  . Toeing-in 08/29/2014  . 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 Interstitial cystitis 04/28/2012  . Recurrent urinary tract infection 08/01/2011  . Asthma 08/01/2011  . Chronic constipation 08/01/2011  . Contraception 08/01/2011    Past Surgical History:  Procedure Laterality Date  . CYSTOSCOPY    . CYSTOSCOPY WITH HYDRODISTENSION AND BIOPSY  04/14/2012   Procedure: CYSTOSCOPY/BIOPSY/HYDRODISTENSION;  Surgeon: Lindaann SloughMarc-Henry Nesi, MD;  [facility] [place];  Service: Urology;;  instillation of marcaine and pyridium  . TONSILLECTOMY      OB History    No data available       Home Medications    Prior to Admission medications   Medication Sig Start Date End  Date Taking? Authorizing Provider  acetaminophen (TYLENOL) 325 MG tablet Take 2 tablets (650 mg total) by mouth every 6 (six) hours as needed for mild pain or moderate pain. 02/27/17  Yes Everlene Farrier, PA-C  albuterol (PROVENTIL) (2.5 MG/3ML) 0.083% nebulizer solution Inhale the contents of one vial in nebulizer every four to six hours as needed for cough or wheeze. 04/23/16  Yes Kozlow, Alvira Philips, MD  benzonatate (TESSALON) 100 MG capsule Take 1-2 capsules (100-200 mg total) by mouth 3  (three) times daily as needed for cough. 12/25/16  Yes Wallis Bamberg, PA-C  budesonide (PULMICORT) 1 MG/2ML nebulizer solution Take 2 mLs (1 mg total) by nebulization 2 (two) times daily. 03/05/17  Yes Kozlow, Alvira Philips, MD  cefdinir (OMNICEF) 300 MG capsule Take 1 capsule (300 mg total) by mouth 2 (two) times daily. 03/01/17  Yes Hagler, [name], MD  cetirizine (ZYRTEC) 10 MG tablet Take one tablet daily for runny nose or itching. 01/27/17  Yes Kozlow, Alvira Philips, MD  cloNIDine (CATAPRES) 0.1 MG tablet take 1 and 1/2 tablet by mouth at bedtime 02/12/17  Yes Goodpasture, Inetta Fermo, NP  Cranberry 500 MG CAPS Take 500 mg by mouth daily. Reported on 09/07/2015   Yes [provider]  DEPAKOTE 500 MG DR tablet Take 1 tablet (500 mg total) by mouth 2 (two) times daily. 10/30/16  Yes Goodpasture, Inetta Fermo, NP  fluconazole (DIFLUCAN) 200 MG tablet TAKE 1 TABLET ONCE AS DIRECTED AND MAY REPEAT IN 48 TO 72 HOURS. 01/03/17  Yes Brock Bad, MD  formoterol (PERFOROMIST) 20 MCG/2ML nebulizer solution Take 2 mLs (20 mcg total) by nebulization 2 (two) times daily. 04/25/16  Yes Kozlow, Alvira Philips, MD  hyoscyamine (LEVSIN SL) 0.125 MG SL tablet DISSOLVE 1 TABLET UNDER THE TONGUE 2 TIMES A DAY 08/08/14  Yes [provider]  Melatonin 3 MG TABS Take 3 mg by mouth at bedtime. For insomnia   Yes [provider]  montelukast (SINGULAIR) 10 MG tablet Take one tablet each evening to prevent cough or wheeze. Patient taking differently: Take 10 mg by mouth at bedtime.  02/25/17  Yes Kozlow, Alvira Philips, MD  ondansetron (ZOFRAN) 4 MG tablet Take 1 tablet (4 mg total) by mouth every 6 (six) hours. 12/31/15  Yes Tharon Aquas, PA  predniSONE (DELTASONE) 20 MG tablet Days 1-4 take 3 pills daily, days 5-8 take 2 pills daily, days 9-12 take 1 pill daily. 03/03/17  Yes Myrlene Broker, MD  ranitidine (ZANTAC) 150 MG capsule Take 1 capsule (150 mg total) 2 (two) times daily by mouth. 01/24/17  Yes Pyrtle, Carie Caddy, MD  senna  (SENOKOT) 8.6 MG tablet Take 1 tablet by mouth daily.   Yes [provider]  torsemide (DEMADEX) 10 MG tablet Take 15 mg by mouth daily.    Yes [provider]  traZODone (DESYREL) 50 MG tablet Take 2 tablets (100 mg total) by mouth at bedtime. 11/07/16  Yes Elveria Rising, NP  Levonorgestrel-Ethinyl Estradiol (AMETHIA) 0.15-0.03 &0.01 MG tablet Take 1 tablet daily by mouth. Patient not taking: Reported on 03/03/2017 02/03/17   Roe Coombs, CNM    Family History Family History  Problem Relation Age of Onset  . Seizures Mother   . Seizures Sister        1 sister has  . Pancreatic cancer Sister   . Colon cancer Neg Hx   . Colon polyps Neg Hx   . Esophageal cancer Neg Hx   . Rectal cancer Neg Hx   .  Stomach cancer Neg Hx     Social History Social History   Tobacco Use  . Smoking status: Never Smoker  . Smokeless tobacco: Never Used  Substance Use Topics  . Alcohol use: No    Alcohol/week: 0.0 oz  . Drug use: No     Allergies   Abilify [aripiprazole]; Seroquel [quetiapine fumarate]; Doxycycline; Amoxicillin-pot clavulanate; and Keppra [levetiracetam]   Review of Systems Review of Systems  Unable to perform ROS: Patient nonverbal     Physical Exam Updated Vital Signs BP 115/81   Pulse 100   Temp 98.5 F (36.9 C)   Resp 20   Ht 4\' 9"  (1.448 m)   Wt 86.6 kg (191 lb)   SpO2 98%   BMI 41.33 kg/m   Physical Exam  Constitutional: She appears well-developed and well-nourished. No distress.  Patient appears comfortable, in no acute distress.  HENT:  Head: Normocephalic and atraumatic.  Right Ear: External ear normal.  Left Ear: External ear normal.  Mouth/Throat: Oropharynx is clear and moist. No oropharyngeal exudate.  Bilateral TMs with good cone of light.  Clear rhinorrhea in the nasal cavity.  Eyes: Conjunctivae are normal. Pupils are equal, round, and reactive to light. Right eye exhibits no discharge. Left eye exhibits no  discharge.  Neck: Normal range of motion. Neck supple.  Cardiovascular: Normal rate, regular rhythm and intact distal pulses. Exam reveals no friction rub.  No murmur heard. Pulmonary/Chest: Effort normal. No stridor. No respiratory distress. She has no rales.  Good air movement.  Coarse expiratory breath sounds in bilateral lower lung fields.  No wheezing, stridor or rales.  Abdominal: Soft. Bowel sounds are normal. There is no tenderness. There is no guarding.  Musculoskeletal: Normal range of motion.  Lymphadenopathy:    She has no cervical adenopathy.  Neurological: She is alert. Coordination normal.  Skin: Skin is warm and dry. Capillary refill takes less than 2 seconds. She is not diaphoretic.  Psychiatric: She has a normal mood and affect.  Nursing note and vitals reviewed.    ED Treatments / Results  Labs (all labs ordered are listed, but only abnormal results are displayed) Labs Reviewed - No data to display  EKG  EKG Interpretation None       Radiology No results found.  Procedures Procedures (including critical care time)  Medications Ordered in ED Medications  ipratropium-albuterol (DUONEB) 0.5-2.5 (3) MG/3ML nebulizer solution 3 mL (not administered)     Initial Impression / Assessment and Plan / ED Course  I have reviewed the triage vital signs and the nursing notes.  Pertinent labs & imaging results that were available during my care of the patient were reviewed by me and considered in my medical decision making (see chart for details).  Clinical Course as of Mar 05 2310  Wed Mar 05, 2017  2144 On recheck, patient is sleeping in the room.  Breathing comfortably on room air.  Lungs clear to auscultation.  [ES]    Clinical Course User Index [ES] Kellie Shropshire, PA-C   Patient presents with ongoing cough and congestion for the past month now.  She has been seen in the emergency department and urgent care several times for similar.  Was seen by  pulmonologist 2 days ago who has her on Cefdinir and prednisone.  She is currently on 60 mg prednisone daily.  Patient is afebrile, nontoxic-appearing and in no acute distress.  She is smiling and playful on exam.  No respiratory distress, breathing comfortably on  room air (pulse ox 98%.) She has coarse exipatory lung sounds on exam, good air movement.  Was given 1 DuoNeb treatment with subsequent improvement on lung exam.  Patient has had extensive workup in both the urgent care in the emergency department over the past month.  Blood cultures, urine cultures, rapid strep screen were all negative four days ago.  Given mother's concern for pneumonia and her history of pneumonia requiring hospitalization in the past, chest x-ray ordered.    Chest x-ray reviewed.  No evidence of edema or consolidation.  Patient is afebrile (rectal temp 99.80F.)  Basic labs reveal an elevated white count with leukocytosis of 28.1. Discussed this patient with Dr. Rush Landmarkegeler who agrees that her leukocytosis is likely due to the fact that patient is on a high dose of steroids.  She does not have any abdominal tenderness on exam, no fever and is active and playful.  Patient's blood glucose elevated (bg 190), counseled mother to have this rechecked.  She agrees.  Have counseled patient's mother to schedule a follow-up appointment with her pulmonologist for recheck and further management of her ongoing cough.  Counseled mother that she should continue patient's cefdinir and prednisone which was prescribed by her pulmonologist.  Jovita GammaGave her nasal suction bulbs in the emergency department to help clear any congestion the patient may have.  Discussed return precautions and patient's mother agrees and voiced understanding to this plan.  She has no complaints prior to discharge.  Final Clinical Impressions(s) / ED Diagnoses   Final diagnoses:  Upper respiratory tract infection, unspecified type    ED Discharge Orders    None         Kellie ShropshireShrosbree, Ivianna Notch J, PA-C 03/06/17 0103    Tegeler, Canary Brimhristopher J, MD 03/06/17 [phone]

## 2017-03-05 NOTE — Telephone Encounter (Signed)
Mother states that her daughter is burning hot and still congested almost worse and was pointing to her chest because it hurts, small appetite. States her daughter was looking like she was going to fall when she was walking to the bathroom, and that she is wetting herself a lot.

## 2017-03-06 ENCOUNTER — Telehealth (INDEPENDENT_AMBULATORY_CARE_PROVIDER_SITE_OTHER): Payer: Self-pay | Admitting: Family

## 2017-03-06 ENCOUNTER — Ambulatory Visit: Payer: Medicare Other | Admitting: Family Medicine

## 2017-03-06 ENCOUNTER — Telehealth: Payer: Self-pay | Admitting: Internal Medicine

## 2017-03-06 ENCOUNTER — Ambulatory Visit: Payer: Self-pay

## 2017-03-06 LAB — CULTURE, BLOOD (ROUTINE X 2)
Culture: NO GROWTH
Culture: NO GROWTH

## 2017-03-06 NOTE — Telephone Encounter (Signed)
I would recommend to continue medication course with omnicef and prednisone and follow up in at least 1 week from now. By changing antibiotics too often we risk overtreating her problem. This will take 2-3 weeks to resolve even if antibiotics have worked. She had an x-ray  Seek care in ER only for shortness of breath or problems breathing. If she is able to walk normally with problems breathing she does not need to go to ER. Can offer zofran for nausea. The white count is not a problem this is because of the prednisone.

## 2017-03-06 NOTE — Telephone Encounter (Signed)
Spoke with pt's mother, [name]. It is very hard to get information from the pt's mother. She is not clear as to what is bothering Br[place]. Pt was taken to the ED yesterday and they told the pt's mother that there wasn't anything they needed to treat the pt for. ED recommended that she call us back this morning. [name] kept stating that the pt's antibiotic needs to be changed but we do not have an antibiotic listed on her current medication list.  MR - please advise. Thanks.

## 2017-03-06 NOTE — Telephone Encounter (Signed)
I called Mom and answered her questions about when a refill was due. She had no further questions. TG

## 2017-03-06 NOTE — Telephone Encounter (Signed)
Spoke with pt's mother, [name]. She is aware of MR's response. Nothing further was needed.

## 2017-03-06 NOTE — Telephone Encounter (Signed)
So she should call whoever gave the omnicef to make the change. We did not  Dr. Kalman ShanMurali Patricia Fargo, M.D., [facility].P Pulmonary and Critical Care Medicine Staff Physician, [facility] Director - Interstitial Lung Disease  Program  Pulmonary Fibrosis [facility] [place], [place], [id number]  Pager: [phone], If no answer or between  15:00h - 7:00h: call [phone] Telephone: [phone]

## 2017-03-06 NOTE — Telephone Encounter (Signed)
Pt mother has called and states that she has spoken with Lori Miles before re: pt and states that Lori Miles told her to call back and ask for her if she had any other concerns re: the care of her daughter.  Please call

## 2017-03-06 NOTE — Telephone Encounter (Signed)
Pt calling back she couldn't get an appt wiith the primary doctor. They made another appt at another [facility] office

## 2017-03-06 NOTE — Telephone Encounter (Signed)
°  Who's calling (name and relationship to patient) : Lori Miles (pt) Best contact number: [phone] Provider they see: Goodpasture  Reason for call: Please call patient.  No detail message left     PRESCRIPTION REFILL ONLY  Name of prescription:  Pharmacy:

## 2017-03-06 NOTE — Telephone Encounter (Signed)
I see omnicef in the MAR. Is she on it?

## 2017-03-06 NOTE — Telephone Encounter (Signed)
Mother calling stated that her daughter has had cough since 02/19/17. Mother states she is coughing up yellow brown secretions and is wheezing. Pt is using Albuterol nebulizer.  Mother states pt is drinking but is vomiting up when coughing. States that pt is not eating and has fatigue. Mother states she is on Cefdinir and prednisone and wants another antiibiotic called in because she feels the abx is not working. Pt was seen in the ED yesterday. Pt has been on "several abx" and Mom feels nothing is working. Explained to Mother that pt will need to see a physician in the ED. Mother refusing stating, "I took her there yesterday and they did nothing for her." Called PCP office and spoke with Sam and updated on condition. Stated that pt needs to go to ED or find [facility]. [facility] Dr[facility] Selena BattenKim.   Reason for Disposition . Wheezing is present  Answer Assessment - Initial Assessment Questions 1. ONSET: "When did the cough begin?"      02/19/17 2. SEVERITY: "How bad is the cough today?"      Coughing till she vomits  3. RESPIRATORY DISTRESS: "Describe your breathing."      Breathing hard and fast 4. FEVER: "Do you have a fever?" If so, ask: "What is your temperature, how was it measured, and when did it start?"     Feels warm to touch 5. SPUTUM: "Describe the color of your sputum" (clear, white, yellow, green)     Yellow brown to green 6. HEMOPTYSIS: "Are you coughing up any blood?" If so ask: "How much?" (flecks, streaks, tablespoons, etc.)     Yes streaks with mucous 7. CARDIAC HISTORY: "Do you have any history of heart disease?" (e.g., heart attack, congestive heart failure)      no 8. LUNG HISTORY: "Do you have any history of lung disease?"  (e.g., pulmonary embolus, asthma, emphysema)     asthma 9. PE RISK FACTORS: "Do you have a history of blood clots?" (or: recent major surgery, recent prolonged travel, bedridden )    Wheel chair  10. OTHER SYMPTOMS: "Do  you have any other symptoms?" (e.g., runny nose, wheezing, chest pain)       Wheezing runny nose chest pain with or without coughing 11. PREGNANCY: "Is there any chance you are pregnant?" "When was your last menstrual period?"       No LMP on BCP 12. TRAVEL: "Have you traveled out of the country in the last month?" (e.g., travel history, exposures)       no  Protocols used: COUGH - ACUTE PRODUCTIVE-A-AH

## 2017-03-06 NOTE — Telephone Encounter (Signed)
I informed her mother of the note below. She did not seem to understand why we could not do anything else. After explaining it a couple of times, she finally seem to understand.

## 2017-03-06 NOTE — Telephone Encounter (Signed)
Called the patient's mother and she states that the patient had new sleep studies completed that has stated that the patient needs a cpap set up for her daughter. I have informed her that the most recent sleep studies I see on file are from Dr Maple HudsonYoung and that the study didn't not clinically show sleep apnea. The patient's mother is insistant that the patient does need it based off his recommendation. I informed the pt that Dr Vickey Hugerohmeier likes to read her own sleep studies and that I do not feel that Dr Vickey Hugerohmeier would be comfortable ordering a CPAP based off of another person's recommendation. The patient's mother says she is willing to have the pt come and complete a sleep study here again. I explained that I don't know if this would be necessary given that her sleep study through us was also negative. I will call the patient's mother back once I hear what Dr Vickey Hugerohmeier would like to know.

## 2017-03-06 NOTE — Telephone Encounter (Signed)
I spoke with patient's mother. I had spoken with Dr.Crawford and by the ED notes she needs to follow up with pulmonary. Patient states she feels like she is getting the run around. Pulmonary keeps telling her, she needs to see her PCP. They can not do nothing about her white blood count being high. Then [facility]. She states she just left the Ed this morning. She stated she only has 2 pills left of the abx and the patient is still so sick. She stated the ED is doing nothing for them so there is no need to go back. She states she just wants her better before Christmas. She does not know where to take the patient or what to do.

## 2017-03-06 NOTE — Telephone Encounter (Signed)
I dont have appt then and agree - sounds like she needs fluids, further eval and possibly iv treatment if failing cefdinir, vomiting, breathing trouble.

## 2017-03-06 NOTE — Telephone Encounter (Signed)
Yes, my apologizes. I didn't see that when I first looked at the med list.

## 2017-03-06 NOTE — Telephone Encounter (Signed)
I called the pt and spoke with her mother and informed her of hte message below.  Patient's mother stated she cancelled the appt and a nurse at the pts office is to call her back with more information as to a medication they are going to call in.

## 2017-03-07 ENCOUNTER — Telehealth (INDEPENDENT_AMBULATORY_CARE_PROVIDER_SITE_OTHER): Payer: Self-pay | Admitting: Pediatrics

## 2017-03-07 NOTE — Telephone Encounter (Signed)
Noted thank you

## 2017-03-07 NOTE — Telephone Encounter (Signed)
I called and talked to Mom. She said that she forgot to tell me yesterday that Lori Miles has been sick and is on antibiotics and steroids. She said that her WBC was elevated at recent ER visit, and that her primary is following that but that she wanted to make me aware. She said that Lori Miles has not had seizures but did have some muscle jerks in her sleep.  I reviewed the recent labs that showed WBC of 28.1 K/ul. I instructed Mom to continue to follow up closely with PCP and to let me know if [name] any [facility]. TG

## 2017-03-07 NOTE — Telephone Encounter (Signed)
°  Who's calling (name and relationship to patient) : [name] (mom) Best contact number: [phone] Provider they see: Dr. Artis FlockWolfe Reason for call: Mom returning Tina's call. Would like for [name] to call her back.

## 2017-03-10 ENCOUNTER — Telehealth: Payer: Self-pay

## 2017-03-10 NOTE — Telephone Encounter (Signed)
We do not have a verified dosing with "water pill". What medicine and dose is she asking for. Also please remind next visit needs to bring pill bottles to visit to help verify.

## 2017-03-10 NOTE — Telephone Encounter (Signed)
Patients mother requesting refill of water pill

## 2017-03-12 ENCOUNTER — Telehealth: Payer: Self-pay | Admitting: Internal Medicine

## 2017-03-12 ENCOUNTER — Ambulatory Visit (HOSPITAL_COMMUNITY)
Admission: EM | Admit: 2017-03-12 | Discharge: 2017-03-12 | Disposition: A | Discharge status: Home / Self Care | Payer: Medicare Other | Attending: Family Medicine | Admitting: Family Medicine

## 2017-03-12 ENCOUNTER — Encounter (HOSPITAL_COMMUNITY): Payer: Self-pay | Admitting: Emergency Medicine

## 2017-03-12 ENCOUNTER — Ambulatory Visit (INDEPENDENT_AMBULATORY_CARE_PROVIDER_SITE_OTHER): Payer: Medicare Other

## 2017-03-12 DIAGNOSIS — R05 Cough: Secondary | ICD-10-CM | POA: Diagnosis not present

## 2017-03-12 DIAGNOSIS — R062 Wheezing: Secondary | ICD-10-CM

## 2017-03-12 MED ORDER — TORSEMIDE 10 MG PO TABS: 15.0000 mg | ORAL_TABLET | Freq: Every day | ORAL | 0 refills | Status: DC

## 2017-03-12 MED ORDER — PREDNISONE 10 MG (48) PO TBPK: ORAL_TABLET | ORAL | 0 refills | Status: DC

## 2017-03-12 NOTE — Discharge Instructions (Signed)
Please keep your appointment with Dr. Okey Duprerawford on January 3rd. I do not see a need for further antibiotic treatment at this time.

## 2017-03-12 NOTE — Telephone Encounter (Signed)
Sent in torsemide. Would need re-evaluation of the cough later this week or early next week to evaluate for the need for antibiotics. She was seen in the ER recently and chest x-ray without any signs of pneumonia and cough (with or without sputum) can last 2-3 weeks after appropriate course of antibiotics. Needs visit sooner for shortness of breath.

## 2017-03-12 NOTE — Telephone Encounter (Signed)
Spoke with mom, she wanted to make sure we called when it was time to make an appt with VS. I advised her the message was sent to VS and we are waiting on a response from him on whether he will accept her as a pt. She understood and would like a call to make the appt when he responds. Nothing further is needed. FYI VS

## 2017-03-12 NOTE — ED Triage Notes (Signed)
PT C/O: persistent cold sx associated w/fevers, nasal congestion, SOB, hoarseness, fevers, prod cough    Alert... NAD... Ambulatory

## 2017-03-12 NOTE — Telephone Encounter (Signed)
See previous msg spoke w/mother concerning another antibiotic...Raechel Chute/lmb

## 2017-03-12 NOTE — Telephone Encounter (Signed)
Copied from CRM [id number]. Topic: Quick Communication - Rx Refill/Question >> Mar 12, 2017 11:06 AM Crist InfanteHarrald, Kathy J wrote: Mom following up on a med request for the pt to get another abx. Pt still has congestion, hoarseness, and upper resp issues. Mom states the prednisone did help some, but pt is still congested. And cannot talk.  Pt went to the ED 12/13, 12/15 and1 2/19, dx with bronchitis.  But mom states they cannot keep going to the ED, now that pt has a PCP. Mom states she cannot wait a week as the nurse had instructed at last call. Mom states pt needs the higher dose zpak.  Pt triaged 12/20. There was a phone encounter created. Mother was called, but is not happy with the outcome from that request.  [facility] - [place], [place] - [address] [phone] (Phone) [phone] (Fax)

## 2017-03-12 NOTE — Telephone Encounter (Signed)
Called pt mom back she states she takes the 15 mg every am. She takes a whole 10 mg tab and a half pill every morning. She also states she called and spoke w/nurse about her daughter cough. She is still coughing up yellowish/brownish phlegm, fever at times, and now starting to loose her voice. She is requesting a higher mg on the zpac, or have MD to rx something else. She does not want her to end up w/pneumonia...Raechel Chute/lmb

## 2017-03-12 NOTE — Telephone Encounter (Signed)
[name] requesting that another abx be called in.

## 2017-03-12 NOTE — Telephone Encounter (Signed)
Okay to schedule appointment with me. ?

## 2017-03-12 NOTE — Telephone Encounter (Signed)
Called and spoke with pt's mother letting her know VS has okayed to see pt for an appt. Scheduled a sleep consult for pt with VS on 04/15/16. Nothing further needed.

## 2017-03-12 NOTE — Telephone Encounter (Signed)
PT called to check on status:  torsemide (DEMADEX) 10 MG tablet    1 and 1/2 tablet once a day if needed for swelling  [facility] - [place], [place] - [address]

## 2017-03-12 NOTE — Telephone Encounter (Signed)
Called pt mom back gave her MD response. Mom is still insisting that she is needing higher dose of the zpac. She states she is not coughing a lot. She does have chest congestion just can't seem to get all of it up, and now she is starting to loose her voice. Tried to explain to mom that's why daughter need to be seen so that the provider can recheck her, and listen to her lungs especially if she is wheezing. Mom is only wanting to see Dr. Okey Duprerawford which I have inform her that she has left for today, and tomorrow. She states she feel like its something that they are not checking. When she went to the ED her white count was elevated. Mom states she is going [facility] or back to the ED, but she did make an appt for next Thurs 03/20/17 for follow w/Dr. Okey Duprerawford...Raechel Chute/lmb

## 2017-03-12 NOTE — Telephone Encounter (Signed)
Did they verify dosing with the patient or is this automatic from the medication list?

## 2017-03-13 ENCOUNTER — Encounter (HOSPITAL_COMMUNITY): Payer: Self-pay | Admitting: Emergency Medicine

## 2017-03-13 ENCOUNTER — Telehealth (INDEPENDENT_AMBULATORY_CARE_PROVIDER_SITE_OTHER): Payer: Self-pay | Admitting: Family

## 2017-03-13 ENCOUNTER — Telehealth: Payer: Self-pay | Admitting: Internal Medicine

## 2017-03-13 ENCOUNTER — Other Ambulatory Visit: Payer: Self-pay

## 2017-03-13 ENCOUNTER — Emergency Department (HOSPITAL_COMMUNITY)
Admission: EM | Admit: 2017-03-13 | Discharge: 2017-03-13 | Disposition: A | Discharge status: Home / Self Care | Payer: Medicare Other | Attending: Emergency Medicine | Admitting: Emergency Medicine

## 2017-03-13 ENCOUNTER — Emergency Department (HOSPITAL_COMMUNITY): Payer: Medicare Other

## 2017-03-13 DIAGNOSIS — J45909 Unspecified asthma, uncomplicated: Secondary | ICD-10-CM | POA: Insufficient documentation

## 2017-03-13 DIAGNOSIS — R5381 Other malaise: Secondary | ICD-10-CM | POA: Diagnosis not present

## 2017-03-13 DIAGNOSIS — D72829 Elevated white blood cell count, unspecified: Secondary | ICD-10-CM

## 2017-03-13 DIAGNOSIS — R509 Fever, unspecified: Secondary | ICD-10-CM | POA: Diagnosis present

## 2017-03-13 DIAGNOSIS — Z79899 Other long term (current) drug therapy: Secondary | ICD-10-CM

## 2017-03-13 DIAGNOSIS — R569 Unspecified convulsions: Secondary | ICD-10-CM | POA: Insufficient documentation

## 2017-03-13 LAB — BASIC METABOLIC PANEL
Anion gap: 15 (ref 5–15)
BUN: 15 mg/dL (ref 6–20)
CALCIUM: 8.9 mg/dL (ref 8.9–10.3)
CO2: 22 mmol/L (ref 22–32)
CREATININE: 1.06 mg/dL — AB (ref 0.44–1.00)
Chloride: 99 mmol/L — ABNORMAL LOW (ref 101–111)
GFR calc Af Amer: >60 mL/min (ref >60)
GFR calc non Af Amer: >60 mL/min (ref >60)
GLUCOSE: 150 mg/dL — AB (ref 65–99)
Potassium: 4.4 mmol/L (ref 3.5–5.1)
Sodium: 136 mmol/L (ref 135–145)

## 2017-03-13 LAB — URINALYSIS, ROUTINE W REFLEX MICROSCOPIC
BILIRUBIN URINE: NEGATIVE
Glucose, UA: >=500 mg/dL — AB
Hgb urine dipstick: NEGATIVE
Ketones, ur: 5 mg/dL — AB
NITRITE: NEGATIVE
PROTEIN: NEGATIVE mg/dL
SPECIFIC GRAVITY, URINE: 1.015 (ref 1.005–1.030)
pH: 8 (ref 5.0–8.0)

## 2017-03-13 LAB — CBC WITH DIFFERENTIAL/PLATELET
BASOS PCT: 0 %
Basophils Absolute: 0 10*3/uL (ref 0.0–0.1)
EOS ABS: 0 10*3/uL (ref 0.0–0.7)
Eosinophils Relative: 0 %
HCT: 40.9 % (ref 36.0–46.0)
Hemoglobin: 13.1 g/dL (ref 12.0–15.0)
Lymphocytes Relative: 14 %
Lymphs Abs: 3.4 10*3/uL (ref 0.7–4.0)
MCH: 30.1 pg (ref 26.0–34.0)
MCHC: 32 g/dL (ref 30.0–36.0)
MCV: 94 fL (ref 78.0–100.0)
MONO ABS: 1 10*3/uL (ref 0.1–1.0)
Monocytes Relative: 4 %
NEUTROS ABS: 19.8 10*3/uL — AB (ref 1.7–7.7)
Neutrophils Relative %: 82 %
PLATELETS: 215 10*3/uL (ref 150–400)
RBC: 4.35 MIL/uL (ref 3.87–5.11)
RDW: 14.4 % (ref 11.5–15.5)
WBC: 24.2 10*3/uL — ABNORMAL HIGH (ref 4.0–10.5)

## 2017-03-13 LAB — I-STAT CG4 LACTIC ACID, ED
LACTIC ACID, VENOUS: 5.52 mmol/L — AB (ref 0.5–1.9)
Lactic Acid, Venous: 2.8 mmol/L (ref 0.5–1.9)
Lactic Acid, Venous: 2.63 mmol/L (ref 0.5–1.9)

## 2017-03-13 LAB — VALPROIC ACID LEVEL: VALPROIC ACID LVL: 98 ug/mL (ref 50.0–100.0)

## 2017-03-13 MED ORDER — SODIUM CHLORIDE 0.9 % IV BOLUS (SEPSIS)
1000.0000 mL | Freq: Once | INTRAVENOUS | Status: AC
  Administered 2017-03-13: 1000 mL via INTRAVENOUS

## 2017-03-13 MED ORDER — VANCOMYCIN HCL IN DEXTROSE 1-5 GM/200ML-% IV SOLN: 1000.0000 mg | Freq: Two times a day (BID) | INTRAVENOUS | Status: DC

## 2017-03-13 MED ORDER — LEVOFLOXACIN IN D5W 750 MG/150ML IV SOLN
750.0000 mg | INTRAVENOUS | Status: DC
  Administered 2017-03-13: 750 mg via INTRAVENOUS
  Filled 2017-03-13: qty 150

## 2017-03-13 MED ORDER — VANCOMYCIN HCL 10 G IV SOLR
1750.0000 mg | Freq: Once | INTRAVENOUS | Status: AC
  Administered 2017-03-13: 1750 mg via INTRAVENOUS
  Filled 2017-03-13: qty 1750

## 2017-03-13 NOTE — ED Triage Notes (Addendum)
Pt mom states she has been diagnosed with a URI, has had fevers of 104 at home. Given a prescription for prednisone last night. Pt mother states "she needs another antibiotic" Pt mother states today she had a seizure when she was standing, and shook her right leg while standing. Then had a syncopal episode and fell to the ground. Pt is alert and oriented per baseline. Not post ictal on assessment. Vitals all within normal limits

## 2017-03-13 NOTE — ED Notes (Signed)
[name] verbalizes concerns for daughter's care, stating nobody has told her why she has been shaking and can't seem to get rid of her sickness, insisting on antibiotics. States her fingers on both hands seem to be more swollen than normal and daughter is normally more continent. No episodes of incontinence here.

## 2017-03-13 NOTE — ED Provider Notes (Signed)
[facility] EMERGENCY DEPARTMENT Provider Note   CSN: [id number] Arrival date & time: 03/13/17  7829     History   Chief Complaint Chief Complaint  Patient presents with  . Seizures  . URI    HPI Lori Miles is a 21 y.o. female.  She is here for evaluation of multiple problems including fevers, cough, falling, shaking, sore throat, rash, and maternal concern for a serious undiagnosed illness.  She has been seen numerous times during last several weeks when the symptoms have been present.  Most recently, seen yesterday, diagnosed with bronchitis and prescribed prednisone, which she has not started taking it.  Her mother wants her to have a strong antibiotic, to help improve her symptoms.  Patient has autism, is mostly nonverbal, gets care from caregivers during the day while her mother is at work, and occasionally goes out of the home for caregiver assistance.  Patient is working with her pulmonologist, to get CPAP.  She is not currently have not yet.  She has not been vomiting, did take morning medicines, but did not eat breakfast yet.  She has a cough which is occasionally productive of yellow to green sputum.  There are no other known modifying factors.  HPI  Past Medical History:  Diagnosis Date  . Allergy   . Asthma   . Autism    mom states no actual dx, but admits she has some symptoms  . Bronchitis   . Constipation   . Frequency-urgency syndrome     some incontinence ; wears pull-ups  . GERD (gastroesophageal reflux disease)   . Interstitial cystitis   . Mental deficiency    hx of aggressive behavior,impaired speech   . Pneumonia   . PONV (postoperative nausea and vomiting)   . Seizure (HCC)    most recent sz " at the end of summer"-last sz years ago per mother  . Sleep apnea    needing to order CPAP    Patient Active Problem List   Diagnosis Date Noted  . Obstructive sleep apnea 09/30/2016  . Excessive daytime sleepiness 03/05/2016  .  Acute bronchitis 05/05/2015  . Gait disorder 03/22/2015  . 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 Cough variant asthma vs UACS/ vcd  03/15/2015  . Autism spectrum disorder 02/14/2015  . Nausea & vomiting 02/14/2015  . Obesity, morbid (HCC) 12/13/2014  . 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 Acanthosis nigricans 11/14/2014  . Toeing-in 08/29/2014  . 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 Interstitial cystitis 04/28/2012  . Recurrent urinary tract infection 08/01/2011  . Asthma 08/01/2011  . Chronic constipation 08/01/2011  . Contraception 08/01/2011    Past Surgical History:  Procedure Laterality Date  . CYSTOSCOPY    . CYSTOSCOPY WITH HYDRODISTENSION AND BIOPSY  04/14/2012   Procedure: CYSTOSCOPY/BIOPSY/HYDRODISTENSION;  Surgeon: Lindaann Slough, MD;  Location: [facility];  Service: Urology;;  instillation of marcaine and pyridium  . TONSILLECTOMY      OB History    No data available       Home Medications    Prior to Admission medications   Medication Sig Start Date End Date Taking? Authorizing Provider  acetaminophen (TYLENOL) 325 MG tablet Take 2 tablets (650 mg total) by mouth every 6 (six) hours as needed for mild pain or moderate pain.  02/27/17   Everlene Farrieransie, William, PA-C  albuterol (PROVENTIL) (2.5 MG/3ML) 0.083% nebulizer solution Inhale the contents of one vial in nebulizer every four to six hours as needed for cough or wheeze. 04/23/16   Kozlow, Alvira PhilipsEric J, MD  benzonatate (TESSALON) 100 MG capsule Take 1-2 capsules (100-200 mg total) by mouth 3 (three) times daily as needed for cough. 12/25/16   Wallis BambergMani, Mario, PA-C  budesonide (PULMICORT) 1 MG/2ML nebulizer solution Take 2 mLs (1 mg total) by nebulization 2 (two) times daily.  03/05/17   Kozlow, Alvira PhilipsEric J, MD  cefdinir (OMNICEF) 300 MG capsule Take 1 capsule (300 mg total) by mouth 2 (two) times daily. 03/01/17   Mardella LaymanHagler, Brian, MD  cetirizine (ZYRTEC) 10 MG tablet Take one tablet daily for runny nose or itching. 01/27/17   Kozlow, Alvira PhilipsEric J, MD  cloNIDine (CATAPRES) 0.1 MG tablet take 1 and 1/2 tablet by mouth at bedtime 02/12/17   Elveria RisingGoodpasture, Tina, NP  Cranberry 500 MG CAPS Take 500 mg by mouth daily. Reported on 09/07/2015    [provider]  DEPAKOTE 500 MG DR tablet Take 1 tablet (500 mg total) by mouth 2 (two) times daily. 10/30/16   Elveria RisingGoodpasture, Tina, NP  fluconazole (DIFLUCAN) 200 MG tablet TAKE 1 TABLET ONCE AS DIRECTED AND MAY REPEAT IN 48 TO 72 HOURS. 01/03/17   Brock BadHarper, Charles A, MD  formoterol (PERFOROMIST) 20 MCG/2ML nebulizer solution Take 2 mLs (20 mcg total) by nebulization 2 (two) times daily. 04/25/16   Kozlow, Alvira PhilipsEric J, MD  hyoscyamine (LEVSIN SL) 0.125 MG SL tablet DISSOLVE 1 TABLET UNDER THE TONGUE 2 TIMES A DAY 08/08/14   [provider]  Levonorgestrel-Ethinyl Estradiol (AMETHIA) 0.15-0.03 &0.01 MG tablet Take 1 tablet daily by mouth. 02/03/17   Denney, Rachelle A, CNM  Melatonin 3 MG TABS Take 3 mg by mouth at bedtime. For insomnia    [provider]  montelukast (SINGULAIR) 10 MG tablet Take one tablet each evening to prevent cough or wheeze. Patient taking differently: Take 10 mg by mouth at bedtime.  02/25/17   Kozlow, Alvira PhilipsEric J, MD  ondansetron (ZOFRAN) 4 MG tablet Take 1 tablet (4 mg total) by mouth every 6 (six) hours. 12/31/15   Tharon AquasPatrick, Frank C, PA  predniSONE (STERAPRED UNI-PAK 48 TAB) 10 MG (48) TBPK tablet Take as directed. 03/12/17   Mardella LaymanHagler, Brian, MD  ranitidine (ZANTAC) 150 MG capsule Take 1 capsule (150 mg total) 2 (two) times daily by mouth. 01/24/17   Pyrtle, Carie CaddyJay M, MD  senna (SENOKOT) 8.6 MG tablet Take 1 tablet by mouth daily.    [provider]  torsemide (DEMADEX) 10 MG tablet Take 1.5 tablets (15 mg total)  by mouth daily. 03/12/17   Myrlene Brokerrawford, Elizabeth A, MD  traZODone (DESYREL) 50 MG tablet Take 2 tablets (100 mg total) by mouth at bedtime. 11/07/16   Elveria RisingGoodpasture, Tina, NP    Family History Family History  Problem Relation Age of Onset  . Seizures Mother   . Seizures Sister        1 sister has  . Pancreatic cancer Sister   . Colon cancer Neg Hx   . Colon polyps Neg Hx   . Esophageal cancer Neg Hx   . Rectal cancer Neg Hx   . Stomach cancer Neg Hx     Social History Social History   Tobacco Use  . Smoking status: Never Smoker  . Smokeless tobacco: Never Used  Substance Use Topics  . Alcohol use: No    Alcohol/week:  0.0 oz  . Drug use: No     Allergies   Abilify [aripiprazole]; Seroquel [quetiapine fumarate]; Doxycycline; Amoxicillin-pot clavulanate; and Keppra [levetiracetam]   Review of Systems Review of Systems  All other systems reviewed and are negative.    Physical Exam Updated Vital Signs BP 114/73   Pulse (!) 108   Temp 98.4 F (36.9 C) (Oral)   Resp (!) 21   Ht 4\' 9"  (1.448 m)   Wt 83.9 kg (185 lb)   SpO2 97%   BMI 40.03 kg/m   Physical Exam  Constitutional: She appears well-developed. No distress.  Overweight.  Patient interacts well, appears happy, is comfortable, and appears nontoxic.  HENT:  Head: Normocephalic and atraumatic.  Eyes: Conjunctivae and EOM are normal. Pupils are equal, round, and reactive to light.  Neck: Normal range of motion and phonation normal. Neck supple.  Cardiovascular: Normal rate and regular rhythm.  Pulmonary/Chest: Effort normal and breath sounds normal. No respiratory distress. She exhibits no tenderness.  Abdominal: Soft. She exhibits no distension. There is no tenderness. There is no guarding.  Musculoskeletal: Normal range of motion.  Neurological: She is alert. She exhibits normal muscle tone.  Skin: Skin is warm and dry.  Psychiatric: She has a normal mood and affect. Her behavior is normal.  Nursing note  and vitals reviewed.    ED Treatments / Results  Labs (all labs ordered are listed, but only abnormal results are displayed) Labs Reviewed  BASIC METABOLIC PANEL - Abnormal; Notable for the following components:      Result Value   Chloride 99 (*)    Glucose, Bld 150 (*)    Creatinine, Ser 1.06 (*)    All other components within normal limits  CBC WITH DIFFERENTIAL/PLATELET - Abnormal; Notable for the following components:   WBC 24.2 (*)    Neutro Abs 19.8 (*)    All other components within normal limits  URINALYSIS, ROUTINE W REFLEX MICROSCOPIC - Abnormal; Notable for the following components:   Glucose, UA >=500 (*)    Ketones, ur 5 (*)    Leukocytes, UA SMALL (*)    Bacteria, UA RARE (*)    Squamous Epithelial / LPF 0-5 (*)    All other components within normal limits  I-STAT CG4 LACTIC ACID, ED - Abnormal; Notable for the following components:   Lactic Acid, Venous 2.80 (*)    All other components within normal limits  I-STAT CG4 LACTIC ACID, ED - Abnormal; Notable for the following components:   Lactic Acid, Venous 5.52 (*)    All other components within normal limits  I-STAT CG4 LACTIC ACID, ED - Abnormal; Notable for the following components:   Lactic Acid, Venous 2.63 (*)    All other components within normal limits  CULTURE, BLOOD (ROUTINE X 2)  CULTURE, BLOOD (ROUTINE X 2)  URINE CULTURE  VALPROIC ACID LEVEL    EKG  EKG Interpretation  Date/Time:  Thursday March 13 2017 07:23:43 EST Ventricular Rate:  103 PR Interval:  134 QRS Duration: 72 QT Interval:  334 QTC Calculation: 437 R Axis:   79 Text Interpretation:  Sinus tachycardia Otherwise normal ECG since last tracing no significant change Confirmed by Mancel Bale [phone]) on 03/13/2017 7:43:02 AM       Radiology Dg Chest 2 View  Result Date: 03/12/2017 CLINICAL DATA:  Productive cough and fever. EXAM: CHEST  2 VIEW COMPARISON:  03/05/2017 FINDINGS: Cardiomediastinal silhouette is normal.  Mediastinal contours appear intact. There is no evidence of  focal airspace consolidation, pleural effusion or pneumothorax. Osseous structures are without acute abnormality. Soft tissues are grossly normal. IMPRESSION: No active cardiopulmonary disease. Electronically Signed   By: Ted Mcalpineobrinka  Dimitrova M.D.   On: 03/12/2017 20:18   Dg Chest Port 1 View  Result Date: 03/13/2017 CLINICAL DATA:  21 year old female post seizure. Subsequent encounter. EXAM: PORTABLE CHEST 1 VIEW COMPARISON:  03/12/2017. FINDINGS: Poor inspiratory exam. Heart size top-normal. No infiltrate, congestive heart failure or pneumothorax. No acute osseous abnormality. IMPRESSION: No active disease. Electronically Signed   By: Lacy DuverneySteven  Olson M.D.   On: 03/13/2017 12:59    Procedures Procedures (including critical care time)  Medications Ordered in ED Medications  levofloxacin (LEVAQUIN) IVPB 750 mg (0 mg Intravenous Stopped 03/13/17 1514)  vancomycin (VANCOCIN) IVPB 1000 mg/200 mL premix (not administered)  sodium chloride 0.9 % bolus 1,000 mL (0 mLs Intravenous Stopped 03/13/17 1313)    And  sodium chloride 0.9 % bolus 1,000 mL (0 mLs Intravenous Stopped 03/13/17 1413)    And  sodium chloride 0.9 % bolus 1,000 mL (0 mLs Intravenous Stopped 03/13/17 1532)  vancomycin (VANCOCIN) 1,750 mg in sodium chloride 0.9 % 500 mL IVPB (0 mg Intravenous Stopped 03/13/17 1550)     Initial Impression / Assessment and Plan / ED Course  I have reviewed the triage vital signs and the nursing notes.  Pertinent labs & imaging results that were available during my care of the patient were reviewed by me and considered in my medical decision making (see chart for details).  Clinical Course as of Mar 13 1625  Thu Mar 13, 2017  1240 Initial lactate was mildly elevated, she was given oral fluids and fluid, and repeat lactate was more elevated.  She has been afebrile here.  Possible infection, with cough.  At this time IV fluids, blood  cultures, and empiric antibiotics for respiratory infection were ordered.  [EW]  1241 Normal Valproic Acid,S: 98 [EW]  1241 Normal Sodium: 136 [EW]  1241 Normal Potassium: 4.4 [EW]  1241 High Glucose: (!) 150 [EW]  1242 Slight elevation Creatinine: (!) 1.06 [EW]  1242 High Lactic Acid, Venous: (!!) 5.52 [EW]  1242 Abnormal WBC, UA: 6-30 [EW]  1242 Abnormal Bacteria, UA: (!) RARE [EW]  1242 High WBC: (!) 24.2 [EW]  1242 Normal Hemoglobin: 13.1 [EW]  1624 Lactate improving with IV fluids. Lactic Acid, Venous: (!!) 2.63 [EW]    Clinical Course User Index [EW] Mancel BaleWentz, Navi Erber, MD    WBC  Date Value Ref Range Status  03/13/2017 24.2 (H) 4.0 - 10.5 K/uL Final  03/05/2017 28.1 (H) 4.0 - 10.5 K/uL Final  03/01/2017 17.9 (H) 4.0 - 10.5 K/uL Final  02/27/2017 13.8 (H) 4.0 - 10.5 K/uL Final     Patient Vitals for the past 24 hrs:  BP Temp Temp src Pulse Resp SpO2 Height Weight  03/13/17 1530 114/73 - - (!) 108 (!) 21 97 % - -  03/13/17 1500 114/75 - - (!) 108 19 98 % - -  03/13/17 1330 121/74 - - (!) 109 (!) 23 97 % - -  03/13/17 1242 - - - - - - 4\' 9"  (1.448 m) 83.9 kg (185 lb)  03/13/17 1230 120/83 - - 96 19 97 % - -  03/13/17 1200 133/66 - - 96 - 98 % - -  03/13/17 1100 124/89 - - 100 20 95 % - -  03/13/17 1015 110/85 - - - - - - -  03/13/17 0945 101/68 - - 72 15  99 % - -  03/13/17 0930 (!) 125/98 - - 83 18 98 % - -  03/13/17 0915 135/88 - - 85 (!) 26 99 % - -  03/13/17 0845 (!) 135/92 - - 81 (!) 21 98 % - -  03/13/17 0830 (!) 129/95 - - 88 20 98 % - -  03/13/17 0815 (!) 127/91 - - 76 (!) 26 98 % - -  03/13/17 0745 124/88 - - 97 17 99 % - -  03/13/17 0741 127/86 98.4 F (36.9 C) Oral 91 18 100 % - -    4:24 PM Reevaluation with update and discussion. After initial assessment and treatment, an updated evaluation reveals she remains comfortable has reassuring vital signs.  Findings discussed with patient's mother and all questions were answered. Mancel Bale      Final  Clinical Impressions(s) / ED Diagnoses   Final diagnoses:  Malaise    Nonspecific complaints, with abnormal initial screening labs, lactate elevated, and increased, however this improved with oral and IV fluids.  Doubt serious bacterial infection, metabolic instability or impending vascular collapse.  Patient has chronically elevated white blood cell count.  No indication for further evaluation or treatment in the ED.  Nursing Notes Reviewed/ Care Coordinated Applicable Imaging Reviewed Interpretation of Laboratory Data incorporated into ED treatment  The patient appears reasonably screened and/or stabilized for discharge and I doubt any other medical condition or other [facility] requiring further screening, evaluation, or treatment in the ED at this time prior to discharge.  Plan: Home Medications-continue current medications; Home Treatments-rest, fluids; return here if the recommended treatment, does not improve the symptoms; Recommended follow up-PCP, as needed    ED Discharge Orders    None       Mancel Bale, MD 03/13/17 1627

## 2017-03-13 NOTE — ED Notes (Signed)
Pt ambulatory to bathroom accompanied by mother with steady gait

## 2017-03-13 NOTE — Telephone Encounter (Signed)
°  Who's calling (name and relationship to patient) : Lori Miles (mom) Best contact number: [phone] Provider they see: Blane OharaGoodpasture  Reason for call: Mom would like for Lori Miles to call her when she comes back to work.  No detail message     PRESCRIPTION REFILL ONLY  Name of prescription:  Pharmacy:

## 2017-03-13 NOTE — Telephone Encounter (Signed)
Routing to dr crawford, fyi... 

## 2017-03-13 NOTE — Telephone Encounter (Signed)
Copied from CRM [phone]#[id number]. Topic: Quick Communication - See Telephone Encounter >> Mar 13, 2017  1:05 PM Louie BunPalacios Medina, [name] wrote: CRM for notification. See Telephone encounter for: 03/13/17. Patient mom called and said that she is in the ED with patient right now and is getting treated. She just wanted to let Dr. Okey Duprerawford know.

## 2017-03-13 NOTE — ED Notes (Signed)
MD notified of pt Lactic acid. Pt eating and drinking by MD request of Po challenge.

## 2017-03-13 NOTE — Progress Notes (Signed)
Pharmacy Antibiotic Note  Daneen SchickJasmine Mechling is a 21 y.o. female admitted on 03/13/2017 with upper respiratory symptoms. She has tried multiple antibiotics recently. Reported Tm of 104, current LA 5.5, WBC up to 24. To start broad pneumonia coverage.   Plan: -Vancomycin 1750 mg IV x1 then 1g/12h -Levaquin 750 mg IV q24h -Monitor renal fx, cultures, VR as needed   Height: 4\' 9"  (144.8 cm) Weight: 185 lb (83.9 kg) IBW/kg (Calculated) : 38.6  Temp (24hrs), Avg:98.4 F (36.9 C), Min:98.4 F (36.9 C), Max:98.4 F (36.9 C)  Recent Labs  Lab 03/13/17 1001 03/13/17 1020 03/13/17 1222  WBC 24.2*  --   --   CREATININE 1.06*  --   --   LATICACIDVEN  --  2.80* 5.52*    Estimated Creatinine Clearance: 74.5 mL/min (A) (by C-G formula based on SCr of 1.06 mg/dL (H)).    Allergies  Allergen Reactions  . Abilify [Aripiprazole] Swelling and Palpitations  . Seroquel [Quetiapine Fumarate] Palpitations  . Doxycycline Other (See Comments)    Stomach cramps  . Amoxicillin-Pot Clavulanate Hives  . Keppra [Levetiracetam] Other (See Comments)    Aggressive behavior     Antimicrobials this admission: 12/27 vancomycin > 12/27 levaquin >  Dose adjustments this admission: N/A  Microbiology results: 12/27 urine cx: 12/27 blood cx:   Baldemar FridayMasters, Katrece Roediger M 03/13/2017 1:20 PM

## 2017-03-13 NOTE — ED Notes (Signed)
Pt provided with ice water and graham crackers with peanut butter

## 2017-03-13 NOTE — ED Notes (Signed)
Pt ambulatory to bathroom accompanied by mother to provide urine sample for culture

## 2017-03-13 NOTE — ED Notes (Signed)
Patient verbalizes understanding of discharge instructions. Opportunity for questioning and answers were provided. Armband removed by staff, pt discharged from ED via wheelchair accompanied by mother.

## 2017-03-13 NOTE — Telephone Encounter (Signed)
Spoke with mom and she refused to give any details. She said that she spoke with [name] two days ago. She stated that when they talked, she told [name] that if anything happened, she would call hr back. She just wants to talk with [name]

## 2017-03-13 NOTE — ED Notes (Signed)
Writer notified EDP Wentz of abnormal I stat lactic result

## 2017-03-13 NOTE — Discharge Instructions (Signed)
Try to drink more fluids, and eat 3 meals each day.  Take all of your medications as directed.  Consider seeing her primary care doctor as well as GI doctor if you have ongoing problems with eating or vomiting.

## 2017-03-13 NOTE — ED Triage Notes (Signed)
Family member states pt was seen this morning for seizures and sent home, per family pt had another seizure tonight and she wants her to be check. Hx of MR, no neuro deficit noticed on triage.

## 2017-03-13 NOTE — ED Notes (Signed)
Md at bedside

## 2017-03-14 ENCOUNTER — Telehealth (INDEPENDENT_AMBULATORY_CARE_PROVIDER_SITE_OTHER): Payer: Self-pay | Admitting: Family

## 2017-03-14 ENCOUNTER — Emergency Department (HOSPITAL_COMMUNITY)
Admission: EM | Admit: 2017-03-14 | Discharge: 2017-03-14 | Disposition: A | Discharge status: Home / Self Care | Payer: Medicare Other | Source: Home / Self Care | Attending: Emergency Medicine | Admitting: Emergency Medicine

## 2017-03-14 DIAGNOSIS — R569 Unspecified convulsions: Secondary | ICD-10-CM

## 2017-03-14 DIAGNOSIS — D72829 Elevated white blood cell count, unspecified: Secondary | ICD-10-CM

## 2017-03-14 DIAGNOSIS — R5381 Other malaise: Secondary | ICD-10-CM | POA: Diagnosis not present

## 2017-03-14 LAB — BASIC METABOLIC PANEL WITH GFR
Anion gap: 8 (ref 5–15)
BUN: 14 mg/dL (ref 6–20)
CO2: 25 mmol/L (ref 22–32)
Calcium: 9 mg/dL (ref 8.9–10.3)
Chloride: 105 mmol/L (ref 101–111)
Creatinine, Ser: 0.91 mg/dL (ref 0.44–1.00)
GFR calc Af Amer: >60 mL/min (ref >60)
GFR calc non Af Amer: >60 mL/min (ref >60)
Glucose, Bld: 160 mg/dL — ABNORMAL HIGH (ref 65–99)
Potassium: 4.7 mmol/L (ref 3.5–5.1)
Sodium: 138 mmol/L (ref 135–145)

## 2017-03-14 LAB — CBC WITH DIFFERENTIAL/PLATELET
BASOS PCT: 0 %
Basophils Absolute: 0 10*3/uL (ref 0.0–0.1)
EOS PCT: 0 %
Eosinophils Absolute: 0 10*3/uL (ref 0.0–0.7)
HEMATOCRIT: 37.7 % (ref 36.0–46.0)
HEMOGLOBIN: 12.1 g/dL (ref 12.0–15.0)
LYMPHS ABS: 4.7 10*3/uL — AB (ref 0.7–4.0)
Lymphocytes Relative: 17 %
MCH: 30.1 pg (ref 26.0–34.0)
MCHC: 32.1 g/dL (ref 30.0–36.0)
MCV: 93.8 fL (ref 78.0–100.0)
MONOS PCT: 6 %
Monocytes Absolute: 1.7 10*3/uL — ABNORMAL HIGH (ref 0.1–1.0)
NEUTROS ABS: 21.1 10*3/uL — AB (ref 1.7–7.7)
Neutrophils Relative %: 77 %
Platelets: 223 10*3/uL (ref 150–400)
RBC: 4.02 MIL/uL (ref 3.87–5.11)
RDW: 14.6 % (ref 11.5–15.5)
WBC: 27.5 10*3/uL — ABNORMAL HIGH (ref 4.0–10.5)

## 2017-03-14 LAB — URINE CULTURE

## 2017-03-14 LAB — CK: Total CK: 257 U/L — ABNORMAL HIGH (ref 38–234)

## 2017-03-14 LAB — I-STAT CG4 LACTIC ACID, ED: Lactic Acid, Venous: 1.52 mmol/L (ref 0.5–1.9)

## 2017-03-14 MED ORDER — LEVOFLOXACIN 500 MG PO TABS: 500.0000 mg | ORAL_TABLET | Freq: Every day | ORAL | 0 refills | Status: DC

## 2017-03-14 NOTE — Telephone Encounter (Signed)
°  Who's calling (name and relationship to patient) : [name] (mom) Best contact number: [phone] Provider they see: Blane OharaGoodpasture  Reason for call: Mom called to speak with Lori Miles or Dr Sharene SkeansHickling.  She was concern about patient not walking. Patient was seen in ED yesterday per mom. She ask to speak with Tiffanie, call transfer to Tiffanie, MA.    PRESCRIPTION REFILL ONLY  Name of prescription:  Pharmacy:

## 2017-03-14 NOTE — Discharge Instructions (Signed)
Continue taking all of your usual medications.

## 2017-03-14 NOTE — Telephone Encounter (Signed)
Copied from CRM [phone]. Topic: Quick Communication - See Telephone Encounter >> Mar 13, 2017  1:05 PM Louie BunPalacios Medina, [name] wrote: CRM for notification. See Telephone encounter for: 03/13/17. Patient mom called and said that she is in the ED with patient right now and is getting treated. She just wanted to let Dr. Okey Duprerawford know. >> Mar 14, 2017 10:18 AM Raquel SarnaHayes, Teresa G wrote: [name] called to speak with Dr. Frutoso Chaserawford's nurse/cma.  She wanted to discuss pt care and hospital visit.  Mother has concern for pt's medication dosage given at the hospital.  Please call mother back asap.

## 2017-03-14 NOTE — Telephone Encounter (Signed)
I tried to speak with mom but the phone kept disconnecting

## 2017-03-14 NOTE — Telephone Encounter (Signed)
°  Who's calling (name and relationship to patient) : [name] (mom) Best contact number: [phone] Provider they see: Aviva Kluverina Goodpature Reason for call: Mom called back and stated that she would like to speak with Tiffanie again.

## 2017-03-14 NOTE — ED Notes (Signed)
EDP at bedside  

## 2017-03-14 NOTE — Telephone Encounter (Signed)
Spoke to pt mother. Pt is home now and she has appt with PCP and specialists coming up. Pt mother had called just to give us an FYI.

## 2017-03-14 NOTE — ED Provider Notes (Signed)
[facility] EMERGENCY [facility] Provider Note   CSN: [id number] Arrival date & time: 03/13/17  2255     History   Chief Complaint Chief Complaint  Patient presents with  . Seizures    HPI Lori SchickJasmine Miles is a 21 y.o. female.  The history is provided by a parent. The history is limited by the condition of the patient (Autism).  Mother brought her back to the emergency department because of seizure.  She had been in the ED during the day following a seizure at home.  Mother states she only gets seizures when she is sick.  She also had a seizure about 10 days ago.  Mother is very confusing historian but she insists that patient only gets seizures when she is secondary normally very well controlled.  She states that for the last month or more, she has had fevers going as high as 104 degrees.  She has had some yellowish to brownish rhinorrhea and has had a cough which is occasionally productive.  There is been no vomiting or diarrhea, and she is actually a usually constipated.  She apparently was incontinent of urine and stool with her seizure this morning, but not the one this evening.  She started shaking and started to fall back, but did not hit anything.  Mother is very unclear as to how long she was shaking but states that shaking was generalized.  There is no bit lip or tongue.  Mother is very focused on the fact that the doctor this morning found some infection and gave her some antibiotics and was going to admit her but then decided to send her home and she went home without any prescription.  Mother is very insistent that the patient needs to be admitted for observation.  Past Medical History:  Diagnosis Date  . Allergy   . Asthma   . Autism    mom states no actual dx, but admits she has some symptoms  . Bronchitis   . Constipation   . Frequency-urgency syndrome     some incontinence ; wears pull-ups  . GERD (gastroesophageal reflux disease)   . Interstitial  cystitis   . Mental deficiency    hx of aggressive behavior,impaired speech   . Pneumonia   . PONV (postoperative nausea and vomiting)   . Seizure (HCC)    most recent sz " at the end of summer"-last sz years ago per mother  . Sleep apnea    needing to order CPAP    Patient Active Problem List   Diagnosis Date Noted  . Obstructive sleep apnea 09/30/2016  . Excessive daytime sleepiness 03/05/2016  . Acute bronchitis 05/05/2015  . Gait disorder 03/22/2015  . 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 Cough variant asthma vs UACS/ vcd  03/15/2015  . Autism spectrum disorder 02/14/2015  . Nausea & vomiting 02/14/2015  . Obesity, morbid (HCC) 12/13/2014  . 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 Acanthosis nigricans 11/14/2014  . Toeing-in 08/29/2014  . 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 Interstitial cystitis 04/28/2012  . Recurrent urinary tract infection 08/01/2011  . Asthma 08/01/2011  . Chronic constipation 08/01/2011  . Contraception 08/01/2011    Past Surgical History:  Procedure Laterality Date  . CYSTOSCOPY    . CYSTOSCOPY WITH HYDRODISTENSION AND BIOPSY  04/14/2012  Procedure: CYSTOSCOPY/BIOPSY/HYDRODISTENSION;  Surgeon: Lindaann Slough, MD;  Location: [facility];  Service: Urology;;  instillation of marcaine and pyridium  . TONSILLECTOMY      OB History    No data available       Home Medications    Prior to Admission medications   Medication Sig Start Date End Date Taking? Authorizing Provider  acetaminophen (TYLENOL) 325 MG tablet Take 2 tablets (650 mg total) by mouth every 6 (six) hours as needed for mild pain or moderate pain. 02/27/17   Everlene Farrier, PA-C  albuterol (PROVENTIL) (2.5 MG/3ML) 0.083%  nebulizer solution Inhale the contents of one vial in nebulizer every four to six hours as needed for cough or wheeze. 04/23/16   Kozlow, Alvira Philips, MD  benzonatate (TESSALON) 100 MG capsule Take 1-2 capsules (100-200 mg total) by mouth 3 (three) times daily as needed for cough. 12/25/16   Wallis Bamberg, PA-C  budesonide (PULMICORT) 1 MG/2ML nebulizer solution Take 2 mLs (1 mg total) by nebulization 2 (two) times daily. 03/05/17   Kozlow, Alvira Philips, MD  cefdinir (OMNICEF) 300 MG capsule Take 1 capsule (300 mg total) by mouth 2 (two) times daily. 03/01/17   Mardella Layman, MD  cetirizine (ZYRTEC) 10 MG tablet Take one tablet daily for runny nose or itching. 01/27/17   Kozlow, Alvira Philips, MD  cloNIDine (CATAPRES) 0.1 MG tablet take 1 and 1/2 tablet by mouth at bedtime 02/12/17   Elveria Rising, NP  Cranberry 500 MG CAPS Take 500 mg by mouth daily. Reported on 09/07/2015    [provider]  DEPAKOTE 500 MG DR tablet Take 1 tablet (500 mg total) by mouth 2 (two) times daily. 10/30/16   Elveria Rising, NP  fluconazole (DIFLUCAN) 200 MG tablet TAKE 1 TABLET ONCE AS DIRECTED AND MAY REPEAT IN 48 TO 72 HOURS. 01/03/17   Brock Bad, MD  formoterol (PERFOROMIST) 20 MCG/2ML nebulizer solution Take 2 mLs (20 mcg total) by nebulization 2 (two) times daily. 04/25/16   Kozlow, Alvira Philips, MD  hyoscyamine (LEVSIN SL) 0.125 MG SL tablet DISSOLVE 1 TABLET UNDER THE TONGUE 2 TIMES A DAY 08/08/14   [provider]  Levonorgestrel-Ethinyl Estradiol (AMETHIA) 0.15-0.03 &0.01 MG tablet Take 1 tablet daily by mouth. 02/03/17   Denney, Rachelle A, CNM  Melatonin 3 MG TABS Take 3 mg by mouth at bedtime. For insomnia    [provider]  montelukast (SINGULAIR) 10 MG tablet Take one tablet each evening to prevent cough or wheeze. Patient taking differently: Take 10 mg by mouth at bedtime.  02/25/17   Kozlow, Alvira Philips, MD  ondansetron (ZOFRAN) 4 MG tablet Take 1 tablet (4 mg total) by mouth every 6 (six) hours.  12/31/15   Tharon Aquas, PA  predniSONE (STERAPRED UNI-PAK 48 TAB) 10 MG (48) TBPK tablet Take as directed. 03/12/17   Mardella Layman, MD  ranitidine (ZANTAC) 150 MG capsule Take 1 capsule (150 mg total) 2 (two) times daily by mouth. 01/24/17   Pyrtle, Carie Caddy, MD  senna (SENOKOT) 8.6 MG tablet Take 1 tablet by mouth daily.    [provider]  torsemide (DEMADEX) 10 MG tablet Take 1.5 tablets (15 mg total) by mouth daily. 03/12/17   Myrlene Broker, MD  traZODone (DESYREL) 50 MG tablet Take 2 tablets (100 mg total) by mouth at bedtime. 11/07/16   Elveria Rising, NP    Family History Family History  Problem Relation Age of Onset  . Seizures Mother   .  Seizures Sister        1 sister has  . Pancreatic cancer Sister   . Colon cancer Neg Hx   . Colon polyps Neg Hx   . Esophageal cancer Neg Hx   . Rectal cancer Neg Hx   . Stomach cancer Neg Hx     Social History Social History   Tobacco Use  . Smoking status: Never Smoker  . Smokeless tobacco: Never Used  Substance Use Topics  . Alcohol use: No    Alcohol/week: 0.0 oz  . Drug use: No     Allergies   Abilify [aripiprazole]; Seroquel [quetiapine fumarate]; Doxycycline; Amoxicillin-pot clavulanate; and Keppra [levetiracetam]   Review of Systems Review of Systems  Unable to perform ROS: Patient nonverbal     Physical Exam Updated Vital Signs BP 118/71 (BP Location: Left Arm)   Pulse 84   Temp 98.1 F (36.7 C) (Oral)   Resp 20   Ht 4\' 9"  (1.448 m)   Wt 83.9 kg (185 lb)   SpO2 100%   BMI 40.03 kg/m   Physical Exam  Nursing note and vitals reviewed.  21 year old female, resting comfortably and in no acute distress. Vital signs are normal. Oxygen saturation is 100%, which is normal.  She is completely nontoxic in appearance. Head is normocephalic and atraumatic. PERRLA, EOMI. Oropharynx is clear.  There is no sinus tenderness. Neck is nontender and supple without adenopathy or JVD. Back is nontender  and there is no CVA tenderness. Lungs are clear without rales, wheezes, or rhonchi. Chest is nontender. Heart has regular rate and rhythm without murmur. Abdomen is soft, flat, nontender without masses or hepatosplenomegaly and peristalsis is normoactive. Extremities have no cyanosis or edema, full range of motion is present. Skin is warm and dry without rash. Neurologic: She is sleeping but arousable but nonverbal, cranial nerves are intact, there are no motor or sensory deficits.  ED Treatments / Results  Labs (all labs ordered are listed, but only abnormal results are displayed) Labs Reviewed  BASIC METABOLIC PANEL - Abnormal; Notable for the following components:      Result Value   Glucose, Bld 160 (*)    All other components within normal limits  CBC WITH DIFFERENTIAL/PLATELET - Abnormal; Notable for the following components:   WBC 27.5 (*)    Neutro Abs 21.1 (*)    Lymphs Abs 4.7 (*)    Monocytes Absolute 1.7 (*)    All other components within normal limits  CK - Abnormal; Notable for the following components:   Total CK 257 (*)    All other components within normal limits  I-STAT CG4 LACTIC ACID, ED  I-STAT CG4 LACTIC ACID, ED     Radiology Dg Chest 2 View  Result Date: 03/12/2017 CLINICAL DATA:  Productive cough and fever. EXAM: CHEST  2 VIEW COMPARISON:  03/05/2017 FINDINGS: Cardiomediastinal silhouette is normal. Mediastinal contours appear intact. There is no evidence of focal airspace consolidation, pleural effusion or pneumothorax. Osseous structures are without acute abnormality. Soft tissues are grossly normal. IMPRESSION: No active cardiopulmonary disease. Electronically Signed   By: Ted Mcalpineobrinka  Dimitrova M.D.   On: 03/12/2017 20:18   Dg Chest Port 1 View  Result Date: 03/13/2017 CLINICAL DATA:  21 year old female post seizure. Subsequent encounter. EXAM: PORTABLE CHEST 1 VIEW COMPARISON:  03/12/2017. FINDINGS: Poor inspiratory exam. Heart size top-normal. No  infiltrate, congestive heart failure or pneumothorax. No acute osseous abnormality. IMPRESSION: No active disease. Electronically Signed   By: Viviann SpareSteven  Constance Goltz M.D.   On: 03/13/2017 12:59    Procedures Procedures (including critical care time)  Medications Ordered in ED Medications - No data to display   Initial Impression / Assessment and Plan / ED Course  I have reviewed the triage vital signs and the nursing notes.  Pertinent labs & imaging results that were available during my care of the patient were reviewed by me and considered in my medical decision making (see chart for details).  Recurrent seizure.  I have reviewed her records from prior ED visit and apparently she was started on sepsis protocol because of elevated lactic acid which did come back down.  Lactic acid could have been elevated from seizure.  She was given a dose of vancomycin and levofloxacin, but decision was made that she did not have infection with they were going to wait for culture results.  Blood and urine cultures have been sent.  Patient does not appear infected currently and does not appear toxic.  Chest x-ray had been read as unremarkable.  She has chronically elevated WBC.  Will recheck CBC and lactic acid level.  I do not see indication for hospitalization.  Also, she had just completed a 10-day course of Cefdinir, but mother states that that antibiotic just was not strong enough.  ED workup shows leukocytosis which has been chronic.  WBC is slightly increased over what it was earlier, but the remainder of her picture indicates no serious infection.  Lactic acid level is come back normal (had been over 5 earlier).  Cultures are not available yet.  It does seem reasonable to continue with antibiotics since they were started for possible sepsis.  She is discharged with prescription for levofloxacin.  I have explained all of the issues to the patient's mother and father and they express understanding.  She will need to  follow-up with her pediatric neurologist regarding the change in seizures, and may need to be started on new anticonvulsants.  She is discharged with prescription for levofloxacin.  Final Clinical Impressions(s) / ED Diagnoses   Final diagnoses:  Seizure-like activity (HCC)  Leukocytosis, unspecified type    ED Discharge Orders        Ordered    levofloxacin (LEVAQUIN) 500 MG tablet  Daily     03/14/17 0333       Dione Booze, MD 03/14/17 [phone]

## 2017-03-15 NOTE — ED Provider Notes (Signed)
[facility]   [id number] 03/12/17 Arrival Time: 1854  ASSESSMENT & PLAN:  1. Wheezing     Meds ordered this encounter  Medications  . predniSONE (STERAPRED UNI-PAK 48 TAB) 10 MG (48) TBPK tablet    Sig: Take as directed.    Dispense:  48 tablet    Refill:  0   Mother is convinced that MaliJasmine needs a medication. I do not feel she needs additional antibiotics at this time. She is mainly concerned over wheezing and continuing cough.  Reminded her of upcoming appt with her PCP. May f/u here as needed.  Reviewed expectations re: course of current medical issues. Questions answered. Outlined signs and symptoms indicating need for more acute intervention. Patient verbalized understanding. After Visit Summary given.   SUBJECTIVE: History from: caregiver.  Zenaya Wicklund is a 21 y.o. female who presents with her mother. Her mother reports concern over continuing wheezing. Albuterol helps temporarily. No SOB. Worse at night. Continues to have intermittent fevers at night. No hemoptysis. Weight stable. Normal appetite. No OTC medications. No specific aggravating or alleviating factors reported..  Social History   Tobacco Use  Smoking Status Never Smoker  Smokeless Tobacco Never Used    ROS: As per HPI.   OBJECTIVE:  Vitals:   03/12/17 1910  BP: 110/71  Pulse: (!) 114  Resp: 20  Temp: 98.4 F (36.9 C)  TempSrc: Oral  SpO2: 95%     General appearance: alert; no distress HEENT: nasal congestion; clear runny nose; throat irritation secondary to post-nasal drainage Neck: supple without LAD Lungs: mild expiratory wheezes bilaterally; no resp distress; slightly dry cough Skin: warm and dry Psychological: alert and cooperative; normal mood and affect   Allergies  Allergen Reactions  . Abilify [Aripiprazole] Swelling and Palpitations  . Seroquel [Quetiapine Fumarate] Palpitations  . Doxycycline Other (See Comments)    Stomach cramps  . Amoxicillin-Pot  Clavulanate Hives  . Keppra [Levetiracetam] Other (See Comments)    Aggressive behavior     Past Medical History:  Diagnosis Date  . Allergy   . Asthma   . Autism    mom states no actual dx, but admits she has some symptoms  . Bronchitis   . Constipation   . Frequency-urgency syndrome     some incontinence ; wears pull-ups  . GERD (gastroesophageal reflux disease)   . Interstitial cystitis   . Mental deficiency    hx of aggressive behavior,impaired speech   . Pneumonia   . PONV (postoperative nausea and vomiting)   . Seizure (HCC)    most recent sz " at the end of summer"-last sz years ago per mother  . Sleep apnea    needing to order CPAP   Family History  Problem Relation Age of Onset  . Seizures Mother   . Seizures Sister        1 sister has  . Pancreatic cancer Sister   . Colon cancer Neg Hx   . Colon polyps Neg Hx   . Esophageal cancer Neg Hx   . Rectal cancer Neg Hx   . Stomach cancer Neg Hx    Social History   Socioeconomic History  . Marital status: Single    Spouse name: Not on file  . Number of children: Not on file  . Years of education: Not on file  . Highest education level: Not on file  Social Needs  . Financial resource strain: Not on file  . Food insecurity - worry: Not on file  .  Food insecurity - inability: Not on file  . Transportation needs - medical: Not on file  . Transportation needs - non-medical: Not on file  Occupational History  . Not on file  Tobacco Use  . Smoking status: Never Smoker  . Smokeless tobacco: Never Used  Substance and Sexual Activity  . Alcohol use: No    Alcohol/week: 0.0 oz  . Drug use: No  . Sexual activity: No    Birth control/protection: Pill  Other Topics Concern  . Not on file  Social History Narrative   Lives with mom Research scientist (physical sciences)([name]) and half-sister [name](Nancy Priola) who is also mentally impaired.  Has CAP assistance, urinary incontinence, needs help with feeding,dressing, toileting   Graduated  from [employer].  She enjoys playing with cars, dancing, and dogs.           Mardella LaymanHagler, Derotha Fishbaugh, MD 03/15/17 1200

## 2017-03-18 ENCOUNTER — Other Ambulatory Visit: Payer: Self-pay

## 2017-03-18 ENCOUNTER — Telehealth (INDEPENDENT_AMBULATORY_CARE_PROVIDER_SITE_OTHER): Payer: Self-pay | Admitting: Pediatrics

## 2017-03-18 ENCOUNTER — Encounter (HOSPITAL_COMMUNITY): Payer: Self-pay | Admitting: Emergency Medicine

## 2017-03-18 ENCOUNTER — Emergency Department (HOSPITAL_COMMUNITY)
Admission: EM | Admit: 2017-03-18 | Discharge: 2017-03-18 | Disposition: A | Discharge status: Home / Self Care | Payer: Medicare Other | Attending: Emergency Medicine | Admitting: Emergency Medicine

## 2017-03-18 ENCOUNTER — Emergency Department (HOSPITAL_COMMUNITY): Payer: Medicare Other

## 2017-03-18 DIAGNOSIS — F840 Autistic disorder: Secondary | ICD-10-CM | POA: Insufficient documentation

## 2017-03-18 DIAGNOSIS — Z79899 Other long term (current) drug therapy: Secondary | ICD-10-CM | POA: Diagnosis not present

## 2017-03-18 DIAGNOSIS — R531 Weakness: Secondary | ICD-10-CM | POA: Diagnosis not present

## 2017-03-18 DIAGNOSIS — J45909 Unspecified asthma, uncomplicated: Secondary | ICD-10-CM | POA: Diagnosis not present

## 2017-03-18 DIAGNOSIS — F71 Moderate intellectual disabilities: Secondary | ICD-10-CM | POA: Insufficient documentation

## 2017-03-18 DIAGNOSIS — R569 Unspecified convulsions: Secondary | ICD-10-CM

## 2017-03-18 DIAGNOSIS — G40309 Generalized idiopathic epilepsy and epileptic syndromes, not intractable, without status epilepticus: Secondary | ICD-10-CM

## 2017-03-18 DIAGNOSIS — G40209 Localization-related (focal) (partial) symptomatic epilepsy and epileptic syndromes with complex partial seizures, not intractable, without status epilepticus: Secondary | ICD-10-CM

## 2017-03-18 LAB — CBC WITH DIFFERENTIAL/PLATELET
BASOS PCT: 0 %
Basophils Absolute: 0 10*3/uL (ref 0.0–0.1)
EOS PCT: 0 %
Eosinophils Absolute: 0 10*3/uL (ref 0.0–0.7)
HCT: 38 % (ref 36.0–46.0)
HEMOGLOBIN: 12.4 g/dL (ref 12.0–15.0)
LYMPHS PCT: 16 %
Lymphs Abs: 3.3 10*3/uL (ref 0.7–4.0)
MCH: 30.2 pg (ref 26.0–34.0)
MCHC: 32.6 g/dL (ref 30.0–36.0)
MCV: 92.5 fL (ref 78.0–100.0)
MONO ABS: 1.2 10*3/uL — AB (ref 0.1–1.0)
Monocytes Relative: 6 %
NEUTROS PCT: 78 %
Neutro Abs: 16.1 10*3/uL — ABNORMAL HIGH (ref 1.7–7.7)
PLATELETS: 247 10*3/uL (ref 150–400)
RBC: 4.11 MIL/uL (ref 3.87–5.11)
RDW: 14.6 % (ref 11.5–15.5)
WBC: 20.6 10*3/uL — AB (ref 4.0–10.5)

## 2017-03-18 LAB — URINALYSIS, ROUTINE W REFLEX MICROSCOPIC
BILIRUBIN URINE: NEGATIVE
Glucose, UA: 50 mg/dL — AB
Hgb urine dipstick: NEGATIVE
KETONES UR: NEGATIVE mg/dL
Leukocytes, UA: NEGATIVE
Nitrite: NEGATIVE
PH: 7 (ref 5.0–8.0)
PROTEIN: NEGATIVE mg/dL
Specific Gravity, Urine: 1.006 (ref 1.005–1.030)

## 2017-03-18 LAB — CULTURE, BLOOD (ROUTINE X 2)
CULTURE: NO GROWTH
CULTURE: NO GROWTH

## 2017-03-18 LAB — I-STAT BETA HCG BLOOD, ED (MC, WL, AP ONLY): I-stat hCG, quantitative: <5 m[IU]/mL (ref <5)

## 2017-03-18 LAB — BASIC METABOLIC PANEL
ANION GAP: 14 (ref 5–15)
BUN: 22 mg/dL — ABNORMAL HIGH (ref 6–20)
CHLORIDE: 96 mmol/L — AB (ref 101–111)
CO2: 24 mmol/L (ref 22–32)
Calcium: 8.7 mg/dL — ABNORMAL LOW (ref 8.9–10.3)
Creatinine, Ser: 1.2 mg/dL — ABNORMAL HIGH (ref 0.44–1.00)
GFR calc Af Amer: >60 mL/min (ref >60)
GLUCOSE: 235 mg/dL — AB (ref 65–99)
POTASSIUM: 4.4 mmol/L (ref 3.5–5.1)
SODIUM: 134 mmol/L — AB (ref 135–145)

## 2017-03-18 LAB — VALPROIC ACID LEVEL: VALPROIC ACID LVL: 37 ug/mL — AB (ref 50.0–100.0)

## 2017-03-18 MED ORDER — SODIUM CHLORIDE 0.9 % IV BOLUS (SEPSIS)
1000.0000 mL | Freq: Once | INTRAVENOUS | Status: AC
  Administered 2017-03-18: 1000 mL via INTRAVENOUS

## 2017-03-18 NOTE — ED Notes (Signed)
Mother called me in stating her daughter was having an active seizure. Upon arrival patient had her eyes closed normally and her feet and ankles were moving without vigor. Patient had no other visual signs of seizure activity.

## 2017-03-18 NOTE — ED Notes (Signed)
Mother, who is primary caregiver,  states that patient has been having "episodes" and is not her usual self today. Mother states that she has a regular neurologist and has an appointment but wants an MRI today so she can bring the results to her next appointment.

## 2017-03-18 NOTE — ED Notes (Signed)
Patient mother refused discharge until husband returned with special wipes to clean patient up.

## 2017-03-18 NOTE — ED Triage Notes (Signed)
Mother reported that pt. had 2 episodes of seizures today and fell at 2nd episode , no seizures at arrival , alert and conscious /respirations unlabored , pt. stated pain at bilateral thighs .

## 2017-03-18 NOTE — Discharge Instructions (Signed)
It was my pleasure taking care of you today!   It is very important to take Depakote daily as directed with no missed doses.   The neurology clinic should be calling you tomorrow to inform you on a follow up appointment and schedule your EEG.   Return to ER for new or worsening symptoms, any additional concerns.

## 2017-03-18 NOTE — ED Notes (Signed)
PA in room at this time 

## 2017-03-18 NOTE — Telephone Encounter (Signed)
Mother called from the emergency room, reports multiple concerns including shaking of her legs all over, "like parkinsons", difficulty walking. Mother feels this is related to her seizures.  She has had multiple concerns since the beginning of the month, including pneumonia and cystitis.  In review of the records, it appears she has been repeatedly diagnosed with URI.  Is on cefdinir and prednisone from the pulmonologist however. Mother requesting MRI.  CT head has been done and normal.  I explained that MRI will not likely give us an answer.  She has history of possible seizures, but last EEG 2013 and looks like seizue diagnosis was never confirmed.  I discussed with mother that EEG would be more helpful to diagnose seizure, but may not be able to get it today.   I called the ED provider, who also felt mother had mulitple concerns, but does not see any acute problems for [facility] today.  Mother reporting one seizure while here in the ED, but nurse witnessed the event and said she wasn't shaking. Mother had reported shaking in the bed, but provided was just in there and sad Kaylenn was sleepy but interactive, not shaking.   After discussion of the possible work-up, we both agree she does not appear to have an acute neurologic problem that needs emergent evaluation.  She may have had a seizure, but with history of possible seizure disorder and other recent illness. Not concerned for subclinical status. Depakote level was low, but this was likely a trough whereas last depakote level was likely a peak.  She has been on this dose for some time and looks that it is mostly for behavior. Blood suger high but has been on high dose steroids. We discussed our plan would be to get an EEG as an outpatient this week and try to get her in sooner than 1/15 to see [name].  [facility] and get these planned.    She has an appointment with her PCP on 1/3 as well.   Lorenz CoasterStephanie Marcus Schwandt MD MPH

## 2017-03-18 NOTE — ED Provider Notes (Signed)
[facility] EMERGENCY DEPARTMENT Provider Note   CSN: [id number] Arrival date & time: 03/18/17  0217     History   Chief Complaint Chief Complaint  Patient presents with  . Seizures    HPI Lori Miles is a 22 y.o. female.  The history is provided by medical records and a parent. No language interpreter was used.  Seizures     Lori Miles is a 22 y.o. female  with a PMH of autism, interstitial cystitis who presents to the with mother for weakness and change in her usual baseline health.  Her mother, she has been having "episodes" while she is walking where her arms will begin to shake and she appears very weak.  While having these shaking spells, she is able to stay standing most of the time.  This morning, she did have an episode where she fell which concerned mother and prompted her to come to the emergency department today.  She did not have any incontinence during these episodes, but did urinate in bed while alert and interactive with nursing staff.  Mother states that she typically can get from bed to chair or other small distances slowly without falling, but has appeared very weak and shakes when trying to do so.  She is very concerned that these may be seizures or other neurologic problem. No fever or chills. Has had URI symptoms over the last few weeks but these have been improving.   Level V caveat applies 2/2 autism. Majority of history obtained by mother.    Past Medical History:  Diagnosis Date  . Allergy   . Asthma   . Autism    mom states no actual dx, but admits she has some symptoms  . Bronchitis   . Constipation   . Frequency-urgency syndrome     some incontinence ; wears pull-ups  . GERD (gastroesophageal reflux disease)   . Interstitial cystitis   . Mental deficiency    hx of aggressive behavior,impaired speech   . Pneumonia   . PONV (postoperative nausea and vomiting)   . Seizure (HCC)    most recent sz " at the end of  summer"-last sz years ago per mother  . Sleep apnea    needing to order CPAP    Patient Active Problem List   Diagnosis Date Noted  . Obstructive sleep apnea 09/30/2016  . Excessive daytime sleepiness 03/05/2016  . Acute bronchitis 05/05/2015  . Gait disorder 03/22/2015  . 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 Cough variant asthma vs UACS/ vcd  03/15/2015  . Autism spectrum disorder 02/14/2015  . Nausea & vomiting 02/14/2015  . Obesity, morbid (HCC) 12/13/2014  . 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 Acanthosis nigricans 11/14/2014  . Toeing-in 08/29/2014  . 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 Interstitial cystitis 04/28/2012  . Recurrent urinary tract infection 08/01/2011  . Asthma 08/01/2011  . Chronic constipation 08/01/2011  . Contraception 08/01/2011    Past Surgical History:  Procedure Laterality Date  . CYSTOSCOPY    . CYSTOSCOPY WITH HYDRODISTENSION AND BIOPSY  04/14/2012   Procedure: CYSTOSCOPY/BIOPSY/HYDRODISTENSION;  Surgeon: Lindaann Slough, MD;  Location: [facility];  Service: Urology;;  instillation of marcaine and pyridium  . TONSILLECTOMY      OB History    No data available  Home Medications    Prior to Admission medications   Medication Sig Start Date End Date Taking? Authorizing Provider  acetaminophen (TYLENOL) 325 MG tablet Take 2 tablets (650 mg total) by mouth every 6 (six) hours as needed for mild pain or moderate pain. 02/27/17  Yes Everlene Farrieransie, William, PA-C  albuterol (PROVENTIL) (2.5 MG/3ML) 0.083% nebulizer solution Inhale the contents of one vial in nebulizer every four to six hours as needed for cough or wheeze. 04/23/16  Yes Kozlow, Alvira PhilipsEric J, MD  benzonatate (TESSALON) 100 MG capsule Take 1-2  capsules (100-200 mg total) by mouth 3 (three) times daily as needed for cough. 12/25/16  Yes Wallis BambergMani, Mario, PA-C  budesonide (PULMICORT) 1 MG/2ML nebulizer solution Take 2 mLs (1 mg total) by nebulization 2 (two) times daily. 03/05/17  Yes Kozlow, Alvira PhilipsEric J, MD  cetirizine (ZYRTEC) 10 MG tablet Take one tablet daily for runny nose or itching. 01/27/17  Yes Kozlow, Alvira PhilipsEric J, MD  cloNIDine (CATAPRES) 0.1 MG tablet take 1 and 1/2 tablet by mouth at bedtime 02/12/17  Yes Goodpasture, [name], NP  Cranberry 500 MG CAPS Take 500 mg by mouth daily. Reported on 09/07/2015   Yes [provider]  DEPAKOTE 500 MG DR tablet Take 1 tablet (500 mg total) by mouth 2 (two) times daily. 10/30/16  Yes Goodpasture, [name], NP  fluconazole (DIFLUCAN) 200 MG tablet TAKE 1 TABLET ONCE AS DIRECTED AND MAY REPEAT IN 48 TO 72 HOURS. 01/03/17  Yes Brock BadHarper, Charles A, MD  formoterol (PERFOROMIST) 20 MCG/2ML nebulizer solution Take 2 mLs (20 mcg total) by nebulization 2 (two) times daily. 04/25/16  Yes Kozlow, Alvira PhilipsEric J, MD  hyoscyamine (LEVSIN SL) 0.125 MG SL tablet DISSOLVE 1 TABLET UNDER THE TONGUE 2 TIMES A DAY 08/08/14  Yes [provider]  levofloxacin (LEVAQUIN) 500 MG tablet Take 1 tablet (500 mg total) by mouth daily. 03/14/17  Yes Dione BoozeGlick, David, MD  Levonorgestrel-Ethinyl Estradiol (AMETHIA) 0.15-0.03 &0.01 MG tablet Take 1 tablet daily by mouth. 02/03/17  Yes Denney, Rachelle A, CNM  Melatonin 3 MG TABS Take 3 mg by mouth at bedtime. For insomnia   Yes [provider]  montelukast (SINGULAIR) 10 MG tablet Take one tablet each evening to prevent cough or wheeze. Patient taking differently: Take 10 mg by mouth at bedtime.  02/25/17  Yes Kozlow, Alvira PhilipsEric J, MD  ondansetron (ZOFRAN) 4 MG tablet Take 1 tablet (4 mg total) by mouth every 6 (six) hours. 12/31/15  Yes Tharon AquasPatrick, Frank C, PA  predniSONE (STERAPRED UNI-PAK 48 TAB) 10 MG (48) TBPK tablet Take as directed. 03/12/17  Yes Mardella LaymanHagler, Brian, MD  ranitidine (ZANTAC) 150  MG capsule Take 1 capsule (150 mg total) 2 (two) times daily by mouth. 01/24/17  Yes Pyrtle, Carie CaddyJay M, MD  senna (SENOKOT) 8.6 MG tablet Take 1 tablet by mouth daily.   Yes [provider]  torsemide (DEMADEX) 10 MG tablet Take 1.5 tablets (15 mg total) by mouth daily. 03/12/17  Yes Myrlene Brokerrawford, Elizabeth A, MD  traZODone (DESYREL) 50 MG tablet Take 2 tablets (100 mg total) by mouth at bedtime. 11/07/16  Yes Elveria RisingGoodpasture, Tina, NP  cefdinir (OMNICEF) 300 MG capsule Take 1 capsule (300 mg total) by mouth 2 (two) times daily. Patient not taking: Reported on 03/18/2017 03/01/17   Mardella LaymanHagler, Brian, MD    Family History Family History  Problem Relation Age of Onset  . Seizures Mother   . Seizures Sister        1 sister has  . Pancreatic cancer  Sister   . Colon cancer Neg Hx   . Colon polyps Neg Hx   . Esophageal cancer Neg Hx   . Rectal cancer Neg Hx   . Stomach cancer Neg Hx     Social History Social History   Tobacco Use  . Smoking status: Never Smoker  . Smokeless tobacco: Never Used  Substance Use Topics  . Alcohol use: No    Alcohol/week: 0.0 oz  . Drug use: No     Allergies   Abilify [aripiprazole]; Seroquel [quetiapine fumarate]; Doxycycline; Amoxicillin-pot clavulanate; and Keppra [levetiracetam]   Review of Systems Review of Systems  Reason unable to perform ROS: Patient's baseline mentation. ROS per mother.  Neurological: Positive for weakness.       + shaking / seizure - like - activity.     Physical Exam Updated Vital Signs BP 111/74 (BP Location: Right Arm)   Pulse (!) 101   Temp 97.9 F (36.6 C) (Oral)   Resp (!) 26   Ht 5' (1.524 m)   Wt 86.2 kg (190 lb)   SpO2 97%   BMI 37.11 kg/m   Physical Exam  Constitutional: She appears well-developed and well-nourished. No distress.  Non-toxic appearing.  HENT:  Head: Normocephalic and atraumatic.  Cardiovascular: Normal rate, regular rhythm and normal heart sounds.  No murmur heard. Pulmonary/Chest:  Effort normal and breath sounds normal. No respiratory distress.  Abdominal: Soft. She exhibits no distension. There is no tenderness.  Musculoskeletal: She exhibits no edema.  Neurological: She is alert.  Alert, interactive. Follows commands. Moves all four extremities independently with 5/5 muscle strength and full ROM. CN 2-12 intact. Responds to sensation in all four extremities as well.   Skin: Skin is warm and dry.  Nursing note and vitals reviewed.    ED Treatments / Results  Labs (all labs ordered are listed, but only abnormal results are displayed) Labs Reviewed  CBC WITH DIFFERENTIAL/PLATELET - Abnormal; Notable for the following components:      Result Value   WBC 20.6 (*)    Neutro Abs 16.1 (*)    Monocytes Absolute 1.2 (*)    All other components within normal limits  BASIC METABOLIC PANEL - Abnormal; Notable for the following components:   Sodium 134 (*)    Chloride 96 (*)    Glucose, Bld 235 (*)    BUN 22 (*)    Creatinine, Ser 1.20 (*)    Calcium 8.7 (*)    All other components within normal limits  VALPROIC ACID LEVEL - Abnormal; Notable for the following components:   Valproic Acid Lvl 37 (*)    All other components within normal limits  URINALYSIS, ROUTINE W REFLEX MICROSCOPIC - Abnormal; Notable for the following components:   Color, Urine COLORLESS (*)    Glucose, UA 50 (*)    All other components within normal limits  I-STAT BETA HCG BLOOD, ED ([facility], [facility], [facility] ONLY)    EKG  EKG Interpretation  Date/Time:  Tuesday March 18 2017 02:26:57 EST Ventricular Rate:  99 PR Interval:  138 QRS Duration: 80 QT Interval:  326 QTC Calculation: 418 R Axis:   71 Text Interpretation:  Normal sinus rhythm Nonspecific ST and T wave abnormality Abnormal ECG No significant change since last tracing Confirmed by Frederick Peers [phone]) on 03/18/2017 10:29:25 AM       Radiology Ct Head Wo Contrast  Result Date: 03/18/2017 CLINICAL DATA:  22 year old female with a  history of seizure EXAM: CT HEAD WITHOUT  CONTRAST TECHNIQUE: Contiguous axial images were obtained from the base of the skull through the vertex without intravenous contrast. COMPARISON:  07/07/2015 FINDINGS: Brain: No acute intracranial hemorrhage. No midline shift or mass effect. Gray-white differentiation maintained. Unremarkable appearance of the ventricular system. Vascular: Unremarkable. Skull: No acute fracture.  No aggressive bone lesion identified. Sinuses/Orbits: Frothy material within the right maxillary sinus. Sphenoid sinuses clear. Left maxillary sinus clear. Ethmoid air cells and frontal sinuses are clear. No mastoid effusion. Unremarkable appearance of the orbits. Other: None IMPRESSION: Negative head CT for acute intracranial abnormality. Right maxillary sinus disease. Electronically Signed   By: Gilmer Mor D.O.   On: 03/18/2017 09:37    Procedures Procedures (including critical care time)  Medications Ordered in ED Medications  sodium chloride 0.9 % bolus 1,000 mL (0 mLs Intravenous Stopped 03/18/17 0941)     Initial Impression / Assessment and Plan / ED Course  I have reviewed the triage vital signs and the nursing notes.  Pertinent labs & imaging results that were available during my care of the patient were reviewed by me and considered in my medical decision making (see chart for details).    Eboni Whitfill is a 22 y.o. female who presents to ED for seizure like activity and weakness. Per mother, patient had three falls over the last two weeks where she would get weak, arms and legs would tremble and she would seem as if she could not stand, then fall. Mother also reports that she has been having seizure-like-activity where her face will twitch. Upon my evaluation, patient is alert and interactive with no focal neuro deficits.  She is nontoxic appearing.  She states that she feels good and is hungry.  While in the room during examination, she did urinate in the bed.  This  was while she was sitting up alert and looking around the room.  Do not believe this was incontinence due to seizure.  While observing in ED, mother called out to the nurse for concern of seizure activity.  She recorded only the patient's face.  Upon watching the video, patient with eyes closed and does have some twitching of the lips.  Difficult to determine if this is seizure activity or not given that I can only see patients face.  Nursing staff reports that they walked into the room immediately and that her eyes were closed and arms and legs were moving, but not as if she was having a seizure.  Nurse did not feel like this was seizure-like activity, but again difficult for me to say with certainty as I did not witness the fall event.  She does have leukocytosis which is actually improved from 4 days ago down from 27.5-20.6.  Lungs are clear to auscultation and previous x-ray was negative as well.  Doubt PNA. UA with no signs of infection. Valproic acid level is low at 37. Patient took her home medication while in ED today.  CT head negative.  Spoke with the patient's pediatric neurology clinic, Dr. Sheppard Penton, about patient condition and mother's concerns.  I appreciate her assistance with patient's care today. Per Dr. Sheppard Penton (and I agree), patient does not require ongoing emergent intervention or inpatient treatment. Will need EEG which Dr. Sheppard Penton has graciously offered to arrange this week. She will also work on getting patient a close neurology follow up appointment. I have spoken with mother at length about plan of care and return precautions which she understands. All questions answered.   Patient discussed with Dr.  Little who agrees with treatment plan.    Final Clinical Impressions(s) / ED Diagnoses   Final diagnoses:  Seizure-like activity [facility])  Weakness    ED Discharge Orders    None       Ward, Chase Picket, PA-C 03/18/17 1312    Little, Ambrose Finland, MD 03/19/17 [phone]

## 2017-03-18 NOTE — ED Notes (Signed)
Patient brought back into room and during this time patient urinated in chair. Patient was unable to give warning.

## 2017-03-19 ENCOUNTER — Ambulatory Visit (INDEPENDENT_AMBULATORY_CARE_PROVIDER_SITE_OTHER): Payer: Medicare Other | Admitting: Family

## 2017-03-19 ENCOUNTER — Encounter: Payer: Self-pay | Admitting: Family

## 2017-03-19 ENCOUNTER — Encounter (HOSPITAL_COMMUNITY): Payer: Self-pay

## 2017-03-19 ENCOUNTER — Telehealth (INDEPENDENT_AMBULATORY_CARE_PROVIDER_SITE_OTHER): Payer: Self-pay | Admitting: Family

## 2017-03-19 ENCOUNTER — Other Ambulatory Visit: Payer: Self-pay

## 2017-03-19 ENCOUNTER — Emergency Department (HOSPITAL_COMMUNITY)
Admission: EM | Admit: 2017-03-19 | Discharge: 2017-03-20 | Disposition: A | Discharge status: Home / Self Care | Payer: Medicare Other | Attending: Emergency Medicine | Admitting: Emergency Medicine

## 2017-03-19 VITALS — BP 108/78 | HR 121 | Temp 97.9°F

## 2017-03-19 DIAGNOSIS — Z79899 Other long term (current) drug therapy: Secondary | ICD-10-CM | POA: Insufficient documentation

## 2017-03-19 DIAGNOSIS — R000 Tachycardia, unspecified: Secondary | ICD-10-CM | POA: Diagnosis not present

## 2017-03-19 DIAGNOSIS — I951 Orthostatic hypotension: Secondary | ICD-10-CM | POA: Insufficient documentation

## 2017-03-19 DIAGNOSIS — R7309 Other abnormal glucose: Secondary | ICD-10-CM

## 2017-03-19 DIAGNOSIS — G40309 Generalized idiopathic epilepsy and epileptic syndromes, not intractable, without status epilepticus: Secondary | ICD-10-CM

## 2017-03-19 DIAGNOSIS — F840 Autistic disorder: Secondary | ICD-10-CM | POA: Insufficient documentation

## 2017-03-19 DIAGNOSIS — G40209 Localization-related (focal) (partial) symptomatic epilepsy and epileptic syndromes with complex partial seizures, not intractable, without status epilepticus: Secondary | ICD-10-CM

## 2017-03-19 DIAGNOSIS — R55 Syncope and collapse: Secondary | ICD-10-CM | POA: Diagnosis present

## 2017-03-19 DIAGNOSIS — N179 Acute kidney failure, unspecified: Secondary | ICD-10-CM | POA: Diagnosis not present

## 2017-03-19 DIAGNOSIS — J45909 Unspecified asthma, uncomplicated: Secondary | ICD-10-CM | POA: Insufficient documentation

## 2017-03-19 LAB — CBC WITH DIFFERENTIAL/PLATELET
BASOS PCT: 1 %
Basophils Absolute: 0.2 10*3/uL — ABNORMAL HIGH (ref 0.0–0.1)
EOS ABS: 0 10*3/uL (ref 0.0–0.7)
Eosinophils Relative: 0 %
HEMATOCRIT: 39.4 % (ref 36.0–46.0)
Hemoglobin: 12.8 g/dL (ref 12.0–15.0)
LYMPHS PCT: 38 %
Lymphs Abs: 8 10*3/uL — ABNORMAL HIGH (ref 0.7–4.0)
MCH: 30.5 pg (ref 26.0–34.0)
MCHC: 32.5 g/dL (ref 30.0–36.0)
MCV: 94 fL (ref 78.0–100.0)
MONOS PCT: 7 %
Monocytes Absolute: 1.5 10*3/uL — ABNORMAL HIGH (ref 0.1–1.0)
NEUTROS ABS: 11.4 10*3/uL — AB (ref 1.7–7.7)
Neutrophils Relative %: 54 %
Platelets: 261 10*3/uL (ref 150–400)
RBC: 4.19 MIL/uL (ref 3.87–5.11)
RDW: 15 % (ref 11.5–15.5)
WBC: 21.1 10*3/uL — ABNORMAL HIGH (ref 4.0–10.5)

## 2017-03-19 LAB — BASIC METABOLIC PANEL
Anion gap: 12 (ref 5–15)
BUN: 23 mg/dL — AB (ref 6–20)
CO2: 26 mmol/L (ref 22–32)
CREATININE: 1.1 mg/dL — AB (ref 0.44–1.00)
Calcium: 8.5 mg/dL — ABNORMAL LOW (ref 8.9–10.3)
Chloride: 98 mmol/L — ABNORMAL LOW (ref 101–111)
GFR calc Af Amer: >60 mL/min (ref >60)
GFR calc non Af Amer: >60 mL/min (ref >60)
Glucose, Bld: 184 mg/dL — ABNORMAL HIGH (ref 65–99)
Potassium: 3.9 mmol/L (ref 3.5–5.1)
Sodium: 136 mmol/L (ref 135–145)

## 2017-03-19 MED ORDER — SODIUM CHLORIDE 0.9 % IV BOLUS (SEPSIS)
1000.0000 mL | Freq: Once | INTRAVENOUS | Status: AC
  Administered 2017-03-19: 1000 mL via INTRAVENOUS

## 2017-03-19 MED ORDER — DEPAKOTE 500 MG PO TBEC: DELAYED_RELEASE_TABLET | ORAL | 5 refills | Status: DC

## 2017-03-19 NOTE — Telephone Encounter (Signed)
°  Who's calling (name and relationship to patient) : [name] (mom)  Best contact number: [phone]  Provider they see: Blane OharaGoodpasture   Reason for call: Caller statese she spoke to Dr Artis FlockWolfe earlier and she is wanting her to call back.  Caller states her daughter had another seizure when she was coming in the house and her heart beat was racing.  Mother states she was able to catch her before she hit the ground.  Dr Artis FlockWolfe handled the call on 03/18/2017   [facility]     PRESCRIPTION REFILL ONLY  Name of prescription:  Pharmacy:

## 2017-03-19 NOTE — ED Provider Notes (Signed)
[facility] EMERGENCY DEPARTMENT Provider Note  CSN: [id number] Arrival date & time: 03/19/17 1645  Chief Complaint(s) No chief complaint on file.  HPI Lori Miles is a 22 y.o. female with a history of autism, interstitial cystitis, asthma, seizures who presents to the emergency department after being evaluated by her primary care provider to be assessed for possible admission.  The patient has been treated for bronchitis with steroids and possible urinary tract infection treated with Levaquin.  The patient has been evaluated multiple times in the emergency department over the past month and had an initial chest x-ray concerning for viral process.  Subsequent chest x-rays revealed resolved findings with no evidence of pneumonia.  There were several UAs concerning for possible urinary tract infection but the last UA obtained yesterday was normal.  Labs noted that the patient had mild AK I.  Patient was also noted to be mildly tachycardic.  Due to the patient being on steroids, they also noted that the patient's sugars were elevated.  Given these findings, her primary care provider wanted her to be evaluated for possible admission.  In addition family reports that the patient has been having difficulty ambulating.  They state that after standing and taking a few steps the patient becomes unsteady and starts jerking, causing her to fall to the floor.  They also report that the patient has had increased urination.  She is currently on torsemide for peripheral edema.  No change in her dosing.  They deny any recent fevers over the last several days.  Remainder of history, ROS, and physical exam limited due to patient's condition (developmental delay). Additional information was obtained from family.   Level V Caveat.    HPI  Past Medical History Past Medical History:  Diagnosis Date  . Allergy   . Asthma   . Autism    mom states no actual dx, but admits she has some  symptoms  . Bronchitis   . Constipation   . Frequency-urgency syndrome     some incontinence ; wears pull-ups  . GERD (gastroesophageal reflux disease)   . Interstitial cystitis   . Mental deficiency    hx of aggressive behavior,impaired speech   . Pneumonia   . PONV (postoperative nausea and vomiting)   . Seizure (HCC)    most recent sz " at the end of summer"-last sz years ago per mother  . Sleep apnea    needing to order CPAP   Patient Active Problem List   Diagnosis Date Noted  . Obstructive sleep apnea 09/30/2016  . Excessive daytime sleepiness 03/05/2016  . Acute bronchitis 05/05/2015  . Gait disorder 03/22/2015  . 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 Cough variant asthma vs UACS/ vcd  03/15/2015  . Autism spectrum disorder 02/14/2015  . Nausea & vomiting 02/14/2015  . Obesity, morbid (HCC) 12/13/2014  . 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 Acanthosis nigricans 11/14/2014  . Toeing-in 08/29/2014  . 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 Interstitial cystitis 04/28/2012  . Recurrent urinary tract infection 08/01/2011  . Asthma 08/01/2011  . Chronic constipation 08/01/2011  . Contraception 08/01/2011   Home Medication(s) Prior to Admission medications   Medication Sig Start Date End Date Taking? Authorizing Provider  acetaminophen (TYLENOL) 325 MG tablet Take 2 tablets (650 mg total) by mouth  every 6 (six) hours as needed for mild pain or moderate pain. 02/27/17  Yes Everlene Farrieransie, William, PA-C  albuterol (PROVENTIL) (2.5 MG/3ML) 0.083% nebulizer solution Inhale the contents of one vial in nebulizer every four to six hours as needed for cough or wheeze. 04/23/16  Yes Kozlow, Alvira PhilipsEric J, MD  benzonatate (TESSALON) 100 MG capsule Take 1-2 capsules  (100-200 mg total) by mouth 3 (three) times daily as needed for cough. 12/25/16  Yes Wallis BambergMani, Mario, PA-C  budesonide (PULMICORT) 1 MG/2ML nebulizer solution Take 2 mLs (1 mg total) by nebulization 2 (two) times daily. 03/05/17  Yes Kozlow, Alvira PhilipsEric J, MD  cetirizine (ZYRTEC) 10 MG tablet Take one tablet daily for runny nose or itching. 01/27/17  Yes Kozlow, Alvira PhilipsEric J, MD  cloNIDine (CATAPRES) 0.1 MG tablet take 1 and 1/2 tablet by mouth at bedtime 02/12/17  Yes Goodpasture, [name], NP  Cranberry 500 MG CAPS Take 500 mg by mouth daily. Reported on 09/07/2015   Yes [provider]  DEPAKOTE 500 MG DR tablet Give 1 tablet in the morning and 2 tablets at night 03/19/17  Yes Goodpasture, [name], NP  fluconazole (DIFLUCAN) 200 MG tablet TAKE 1 TABLET ONCE AS DIRECTED AND MAY REPEAT IN 48 TO 72 HOURS. Patient taking differently: TAKE 200 mg TABLET ONCE AS DIRECTED AND MAY REPEAT IN 48 TO 72 HOURS. 01/03/17  Yes Brock BadHarper, Charles A, MD  formoterol (PERFOROMIST) 20 MCG/2ML nebulizer solution Take 2 mLs (20 mcg total) by nebulization 2 (two) times daily. 04/25/16  Yes Kozlow, Alvira PhilipsEric J, MD  hyoscyamine (LEVSIN SL) 0.125 MG SL tablet DISSOLVE 0.125mg  TABLET UNDER THE TONGUE 2 TIMES A DAY 08/08/14  Yes [provider]  levofloxacin (LEVAQUIN) 500 MG tablet Take 1 tablet (500 mg total) by mouth daily. 03/14/17  Yes Dione BoozeGlick, David, MD  Levonorgestrel-Ethinyl Estradiol (SEASONIQUE) 0.15-0.03 &0.01 MG tablet Take 1 tablet by mouth daily.   Yes [provider]  Melatonin 3 MG TABS Take 3 mg by mouth at bedtime. For insomnia   Yes [provider]  montelukast (SINGULAIR) 10 MG tablet Take one tablet each evening to prevent cough or wheeze. Patient taking differently: Take 10 mg by mouth at bedtime.  02/25/17  Yes Kozlow, Alvira PhilipsEric J, MD  ondansetron (ZOFRAN) 4 MG tablet Take 1 tablet (4 mg total) by mouth every 6 (six) hours. 12/31/15  Yes Tharon AquasPatrick, Frank C, PA  ranitidine (ZANTAC) 150 MG capsule Take 1 capsule  (150 mg total) 2 (two) times daily by mouth. 01/24/17  Yes Pyrtle, Carie CaddyJay M, MD  senna (SENOKOT) 8.6 MG tablet Take 1 tablet by mouth daily.   Yes [provider]  torsemide (DEMADEX) 10 MG tablet Take 1.5 tablets (15 mg total) by mouth daily. 03/12/17  Yes Myrlene Brokerrawford, Elizabeth A, MD  traZODone (DESYREL) 50 MG tablet Take 2 tablets (100 mg total) by mouth at bedtime. 11/07/16  Yes Elveria RisingGoodpasture, Tina, NP  cefdinir (OMNICEF) 300 MG capsule Take 1 capsule (300 mg total) by mouth 2 (two) times daily. Patient not taking: Reported on 03/18/2017 03/01/17   Mardella LaymanHagler, Brian, MD  Levonorgestrel-Ethinyl Estradiol (AMETHIA) 0.15-0.03 &0.01 MG tablet Take 1 tablet daily by mouth. Patient not taking: Reported on 03/19/2017 02/03/17   Roe Coombsenney, Rachelle A, CNM  predniSONE (STERAPRED UNI-PAK 48 TAB) 10 MG (48) TBPK tablet Take as directed. Patient not taking: Reported on 03/19/2017 03/12/17   Mardella LaymanHagler, Brian, MD  Past Surgical History Past Surgical History:  Procedure Laterality Date  . CYSTOSCOPY    . CYSTOSCOPY WITH HYDRODISTENSION AND BIOPSY  04/14/2012   Procedure: CYSTOSCOPY/BIOPSY/HYDRODISTENSION;  Surgeon: Lindaann Slough, MD;  Location: [facility] [place];  Service: Urology;;  instillation of marcaine and pyridium  . TONSILLECTOMY     Family History Family History  Problem Relation Age of Onset  . Seizures Mother   . Seizures Sister        1 sister has  . Pancreatic cancer Sister   . Colon cancer Neg Hx   . Colon polyps Neg Hx   . Esophageal cancer Neg Hx   . Rectal cancer Neg Hx   . Stomach cancer Neg Hx     Social History Social History   Tobacco Use  . Smoking status: Never Smoker  . Smokeless tobacco: Never Used  Substance Use Topics  . Alcohol use: No    Alcohol/week: 0.0 oz  . Drug use: No   Allergies Abilify [aripiprazole]; Seroquel [quetiapine  fumarate]; Doxycycline; Amoxicillin-pot clavulanate; and Keppra [levetiracetam]  Review of Systems Review of Systems  Unable to perform ROS: Other  see above  Physical Exam Vital Signs  I have reviewed the triage vital signs BP 100/68   Pulse 99   Temp 98.9 F (37.2 C) (Oral)   Resp (!) 24   SpO2 98%   Physical Exam  Constitutional: She is oriented to person, place, and time. She appears well-developed and well-nourished. No distress.  HENT:  Head: Normocephalic and atraumatic.  Nose: Nose normal.  Eyes: Conjunctivae and EOM are normal. Pupils are equal, round, and reactive to light. Right eye exhibits no discharge. Left eye exhibits no discharge. No scleral icterus.  Neck: Normal range of motion. Neck supple.  Cardiovascular: Normal rate and regular rhythm. Exam reveals no gallop and no friction rub.  No murmur heard. Pulmonary/Chest: Effort normal and breath sounds normal. No stridor. No respiratory distress. She has no rales.  Abdominal: Soft. She exhibits no distension. There is no tenderness.  Musculoskeletal: She exhibits no edema or tenderness.  Neurological: She is alert and oriented to person, place, and time.  Equal strength throughout, moves all extremities. Given antalgic gait, requiring mild assistance.  During ambulation, patient complaining of feeling lightheaded.  Skin: Skin is warm and dry. No rash noted. She is not diaphoretic. No erythema.  Psychiatric: She has a normal mood and affect.  Vitals reviewed.   ED Results and Treatments Labs (all labs ordered are listed, but only abnormal results are displayed) Labs Reviewed  CBC WITH DIFFERENTIAL/PLATELET - Abnormal; Notable for the following components:      Result Value   WBC 21.1 (*)    Neutro Abs 11.4 (*)    Lymphs Abs 8.0 (*)    Monocytes Absolute 1.5 (*)    Basophils Absolute 0.2 (*)    All other components within normal limits  BASIC METABOLIC PANEL - Abnormal; Notable for the following  components:   Chloride 98 (*)    Glucose, Bld 184 (*)    BUN 23 (*)    Creatinine, Ser 1.10 (*)    Calcium 8.5 (*)    All other components within normal limits  EKG  EKG Interpretation  Date/Time:    Ventricular Rate:    PR Interval:    QRS Duration:   QT Interval:    QTC Calculation:   R Axis:     Text Interpretation:        Radiology No results found. Pertinent labs & imaging results that were available during my care of the patient were reviewed by me and considered in my medical decision making (see chart for details).  Medications Ordered in ED Medications  sodium chloride 0.9 % bolus 1,000 mL (not administered)                                                                                                                                    Procedures Procedures  (including critical care time)  Medical Decision Making / ED Course I have reviewed the nursing notes for this encounter and the patient's prior records (if available in EHR or on provided paperwork).    Orthostatics obtain an positive.  Labs revealed improving renal function.  CBC with leukocytosis which is likely secondary to steroid use and recent seizure which she was evaluated for yesterday.  Patient had negative infectious workup yesterday and do not feel that repeat evaluation is necessary at this time.  She was provided with IV fluids.  Will repeat orthostatics after IV fluids.  If orthostatics have improved, patient should be safe for discharge.   Recommended patient take a several day holiday from torsemide.  And taper up over 6 days to her regular dose.  Patient care turned over to Dr Ward at 0100. Patient case and results discussed in detail; please see their note for further ED managment.     This chart was dictated using voice recognition software.  Despite best efforts  to proofread,  errors can occur which can change the documentation meaning.   Nira Conn, MD 03/20/17 [phone]

## 2017-03-19 NOTE — ED Notes (Signed)
ED Provider at bedside. 

## 2017-03-19 NOTE — ED Triage Notes (Signed)
[facility] d/t kidney function elevated, elevated glucose, and elevated heart rate. NP told mother she needed to be admitted

## 2017-03-19 NOTE — Telephone Encounter (Signed)
Per Dr Brett Fairy the patient went and had another sleep study completed and was read by Dr Baird Lyons. Orders were written by Dr Annamaria Boots. The patient would need to reach out and follow up with Dr Annamaria Boots about getting her needs met. This was taken as a transfer of care when she went to another doctor and had another sleep study completed. Dr Brett Fairy would like the patient to follow up and continue her care through with Dr Annamaria Boots. Her sleep studies have not shown apnea that would indicate the need for a cpap machine or classify her with a diagnosis of sleep apnea.  Please advise the patient mother of this if she calls back. She will need to take this issue up with Dr Annamaria Boots since he has done most recent sleep study and is ordering for her.

## 2017-03-19 NOTE — Telephone Encounter (Signed)
I reviewed your note and agree with this plan, thank you. 

## 2017-03-19 NOTE — Telephone Encounter (Signed)
Patient has been scheduled for January 8 @ 9:30 am. To arrive at 9:15

## 2017-03-19 NOTE — Addendum Note (Signed)
Addended by: Princella IonGOODPASTURE, Leonie Amacher P on: 03/19/2017 08:17 AM   Modules accepted: Orders

## 2017-03-19 NOTE — Telephone Encounter (Signed)
I placed order for EEG and will call Mom when scheduled. TG

## 2017-03-19 NOTE — Telephone Encounter (Signed)
°  Who's calling (name and relationship to patient) : Aquentta (mom) Best contact number: [phone] Provider they see:  Goodpasture  Reason for call: Caller states her daughter is very disoriented to the point she cannot stand up. She is having a seizure now.  Dr Artis FlockWolfe handled call on 03/18/2017  [facility]     PRESCRIPTION REFILL ONLY  Name of prescription:  Pharmacy:

## 2017-03-19 NOTE — Telephone Encounter (Signed)
I called and talked to Mom. [facility] at around 1[facility]. She said that when she got her up to go to the bathroom afterwards that Nakyia's leg was turned in awkward position and that she had trouble walking. She also said tha that [facility] trembling and shaking, but after she returned to bed, she went to sleep and had no further seizures. Mom said that [name] seems back to herself this morning. She has been sick with bronchitis but Mom said that is improving. Mom was very worried about low Depakote level noted in ER and I explained to her that it may have been timing of the blood draw or interaction with other medications but that we need to increase the dose and recheck the level in 1 week. I instructed Mom to increase Depakote dose to 1 AM and 2PM. I also told her that [facility] and gave her directions for that. Finally, I moved her appointment with me from 04/01/17 to 03/27/17, and told her that I will review the EEG report with her at that time. I will also give her a lab order to recheck the Depakote level. Mom agreed with the plans made today. TG

## 2017-03-20 ENCOUNTER — Encounter: Payer: Self-pay | Admitting: Internal Medicine

## 2017-03-20 ENCOUNTER — Telehealth: Payer: Self-pay | Admitting: Internal Medicine

## 2017-03-20 ENCOUNTER — Ambulatory Visit (INDEPENDENT_AMBULATORY_CARE_PROVIDER_SITE_OTHER): Payer: Medicare Other | Admitting: Internal Medicine

## 2017-03-20 VITALS — BP 98/60 | HR 122 | Temp 97.7°F | Ht 60.0 in

## 2017-03-20 DIAGNOSIS — R296 Repeated falls: Secondary | ICD-10-CM

## 2017-03-20 DIAGNOSIS — J209 Acute bronchitis, unspecified: Secondary | ICD-10-CM

## 2017-03-20 DIAGNOSIS — F71 Moderate intellectual disabilities: Secondary | ICD-10-CM | POA: Diagnosis not present

## 2017-03-20 DIAGNOSIS — R531 Weakness: Secondary | ICD-10-CM

## 2017-03-20 DIAGNOSIS — I951 Orthostatic hypotension: Secondary | ICD-10-CM | POA: Diagnosis not present

## 2017-03-20 DIAGNOSIS — I872 Venous insufficiency (chronic) (peripheral): Secondary | ICD-10-CM

## 2017-03-20 DIAGNOSIS — G40309 Generalized idiopathic epilepsy and epileptic syndromes, not intractable, without status epilepticus: Secondary | ICD-10-CM | POA: Diagnosis not present

## 2017-03-20 MED ORDER — LEVOFLOXACIN 750 MG PO TABS: 750.0000 mg | ORAL_TABLET | Freq: Every day | ORAL | 0 refills | Status: DC

## 2017-03-20 NOTE — ED Provider Notes (Signed)
Assumed care from Dr. Eudelia Bunchardama at shift change.  See prior notes for full H&P.  Briefly a 22 year old female who is autistic was sent in by PCP for possible admission for dehydration and lightheadedness.  Has been treated for bronchitis with steroids and possible UTI with levaquin recently.  Patient was found to have mild AKI on labs by PCP.  CBG's also elevated.  Dr. Eudelia Bunchardama has spent a great deal of time explaining elevated WBC as well as hyperglycemia in the setting of steroid use.  Patient also with increased urination due to her torsemide.  UA yesterday was negative. Family reported some issues walking which sounds presyncopal.  Has been seen in the ED multiple times recently with rather benign work-ups.  Plan:  Patient has been orthostatic here.  Plan for IVF, repeat orthostatics.  If WNL, can discharge home.  Plan for hold diuretics for a few days, taper back to normal dosing which has been discussed with family.  2:14 AM Patient has had liter bolus.  She appears well.  Orthostatics are now WNL.  She is eating peanut butter crackers in room currently, NAD.  At this time, feel she can be discharged home.  Family is not very keen on taking her home, however after talking with Dr. Eudelia Bunchardama, they seem to understand a little better.  Mother still is very concerned about her her having another possible syncopal/near syncopal episode and her home health nurses not wanting to care for her because of this.  I have explained to her that those concerns alone do not warrant admission and these symptoms of orthostasis should improve with removal of her diuretics from medication regimen.  Again reassured about work-up and improved VS.  They have previously scheduled follow-up tomorrow.   Garlon HatchetSanders, Emelly Wurtz M, PA-C 03/20/17 0220    Ward, Layla MawKristen N, DO 03/20/17 [phone]

## 2017-03-20 NOTE — Telephone Encounter (Signed)
Was taken care of at patients appointment

## 2017-03-20 NOTE — Telephone Encounter (Signed)
Copied from CRM [phone]#[id number]. Topic: Quick Communication - See Telephone Encounter >> Mar 20, 2017  9:14 AM Oneal GroutSebastian, Jennifer S wrote: CRM for notification. See Telephone encounter for:  Patient was yesterday in ofc, was sent to ER. Mother states that Dr Okey Duprerawford stated patient needed to be admitted, patient was sent home by ER. Please advise 03/20/17.

## 2017-03-20 NOTE — Patient Instructions (Signed)
Keep your appointment with Dr. Okey Duprerawford for tomorrow if she is not admitted to hospital

## 2017-03-20 NOTE — Discharge Instructions (Signed)
Stop taking Torsemide for 2 days. On Saturday, start with 1/2 a tablet for 2 days. On Monday, start with 1 tablet for 2 days. On Wednesday, go back to her normal dosing.

## 2017-03-20 NOTE — Telephone Encounter (Signed)
I did not see patient yesterday and did not recommend that she be admitted. She has visit today that she should keep.

## 2017-03-20 NOTE — Patient Instructions (Addendum)
We have sent in levaquin for you to take 1 pill daily for 2 weeks.   We will get the physical therapy to the house.  Stop the prednisone and stay off it.  It is okay to stop the torsemide. We have given you a prescription for the compression stockings instead.   We should see some benefit from stopping the prednisone in 1 week.   See the neurologist as this could be related to seizures as well.

## 2017-03-20 NOTE — Progress Notes (Signed)
   Subjective:    Patient ID: Lori SchickJasmine Miles, female    DOB: 29-Jul-1995, 22 y.o.   MRN: [id number]  HPI The patient is a 22 YO female coming in with her mother (who provides all history) for continuing cough (still present, drainage present, sometimes still coughing and vomiting, has completed course of azithromycin, some levaquin, doxycycline and been on steroids for about the last 2 weeks, denies fevers or chills, no SOB but she is not walking around much or doing much of anything at home, cannot provide independent history outside mom) and many other concerns including weakness (legs are weak, giving out on her, they stopped taking prednisone yesterday after the ER, they have been at the ER multiple times in the last several days with many imaging tests including normal x-ray, head CT, urine culture, blood culture, taken several antibiotics and steroids recently for breathing problems), falls (due to weakness, she will just sit down or fall at home, mom has caught her twice while walking, using a wheelchair at home, previously she used this sometimes but not often, BP low in the ER and they asked her to decrease torsemide dosing but her mother has not done that as she did not trust that provider and did not feel her concerns were addressed adequately), and seizures (treated by neurologist, recent adjustment of dosing of her medications, she has been having sounds like partial seizures at home, recent head CT without changes, some prolonged weakness in her legs at home, visit with them Tuesday).   Review of Systems  Unable to perform ROS: Patient nonverbal  Constitutional: Positive for activity change, appetite change and chills. Negative for fatigue, fever and unexpected weight change.  HENT: Positive for congestion. Negative for ear discharge and ear pain.   Respiratory: Positive for cough. Negative for chest tightness, shortness of breath and wheezing.   Cardiovascular: Negative.     Gastrointestinal: Positive for nausea and vomiting.  Musculoskeletal: Negative for arthralgias, back pain, myalgias and neck stiffness.  Skin: Negative.   Neurological: Positive for dizziness, seizures, speech difficulty and weakness. Negative for light-headedness, numbness and headaches.      Objective:   Physical Exam  Constitutional: She is oriented to person, place, and time. She appears well-developed and well-nourished.  HENT:  Head: Normocephalic and atraumatic.  Oropharynx redness and clear drainage, sinus pain on palpation, ear TM normal.   Eyes: EOM are normal.  Neck: Normal range of motion.  Cardiovascular: Normal rate and regular rhythm.  Pulmonary/Chest: Effort normal. No respiratory distress. She has no wheezes. She has no rales.  Exam limited due to lack of ability to follow commands to take deep inspiration  Abdominal: Soft. Bowel sounds are normal. She exhibits no distension. There is no tenderness. There is no rebound.  Musculoskeletal: She exhibits edema.  1+ edema bilaterally to mid shin  Neurological: She is alert and oriented to person, place, and time. Coordination abnormal.  In wheelchair.  Skin: Skin is warm and dry.  Psychiatric:  Does not respond to questions and distractible.    Vitals:   03/20/17 1542  BP: 98/60  Pulse: (!) 122  Temp: 97.7 F (36.5 C)  TempSrc: Oral  SpO2: 99%  Height: 5' (1.524 m)      Assessment & Plan:

## 2017-03-20 NOTE — Progress Notes (Signed)
Lori Miles is a 22 y.o. female with the following history as recorded in [employer]:  Patient Active Problem List   Diagnosis Date Noted  . Obstructive sleep apnea 09/30/2016  . Excessive daytime sleepiness 03/05/2016  . Acute bronchitis 05/05/2015  . Gait disorder 03/22/2015  . 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 Cough variant asthma vs UACS/ vcd  03/15/2015  . Autism spectrum disorder 02/14/2015  . Nausea & vomiting 02/14/2015  . Obesity, morbid (HCC) 12/13/2014  . 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 Acanthosis nigricans 11/14/2014  . Toeing-in 08/29/2014  . 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 Interstitial cystitis 04/28/2012  . Recurrent urinary tract infection 08/01/2011  . Asthma 08/01/2011  . Chronic constipation 08/01/2011  . Contraception 08/01/2011    Current Outpatient Medications  Medication Sig Dispense Refill  . acetaminophen (TYLENOL) 325 MG tablet Take 2 tablets (650 mg total) by mouth every 6 (six) hours as needed for mild pain or moderate pain. 60 tablet 0  . albuterol (PROVENTIL) (2.5 MG/3ML) 0.083% nebulizer solution Inhale the contents of one vial in nebulizer every four to six hours as needed for cough or wheeze. 120 mL 1  . benzonatate (TESSALON) 100 MG capsule Take 1-2 capsules (100-200 mg total) by mouth 3 (three) times daily as needed for cough. 60 capsule 0  . budesonide (PULMICORT) 1 MG/2ML nebulizer solution Take 2 mLs (1 mg total) by nebulization 2 (two) times daily. 120 mL 5  . cefdinir (OMNICEF) 300 MG capsule Take 1 capsule (300 mg total) by mouth 2 (two) times daily. (Patient not taking: Reported on 03/18/2017) 20 capsule 0  . cetirizine (ZYRTEC) 10 MG tablet Take one tablet daily for runny nose or itching. 30  tablet 0  . cloNIDine (CATAPRES) 0.1 MG tablet take 1 and 1/2 tablet by mouth at bedtime 45 tablet 2  . Cranberry 500 MG CAPS Take 500 mg by mouth daily. Reported on 09/07/2015    . DEPAKOTE 500 MG DR tablet Give 1 tablet in the morning and 2 tablets at night 90 tablet 5  . fluconazole (DIFLUCAN) 200 MG tablet TAKE 1 TABLET ONCE AS DIRECTED AND MAY REPEAT IN 48 TO 72 HOURS. (Patient taking differently: TAKE 200 mg TABLET ONCE AS DIRECTED AND MAY REPEAT IN 48 TO 72 HOURS.) 3 tablet 4  . formoterol (PERFOROMIST) 20 MCG/2ML nebulizer solution Take 2 mLs (20 mcg total) by nebulization 2 (two) times daily. 120 mL 5  . hyoscyamine (LEVSIN SL) 0.125 MG SL tablet DISSOLVE 0.125mg  TABLET UNDER THE TONGUE 2 TIMES A DAY  0  . levofloxacin (LEVAQUIN) 500 MG tablet Take 1 tablet (500 mg total) by mouth daily. 7 tablet 0  . Levonorgestrel-Ethinyl Estradiol (AMETHIA) 0.15-0.03 &0.01 MG tablet Take 1 tablet daily by mouth. (Patient not taking: Reported on 03/19/2017) 1 Package 4  . Levonorgestrel-Ethinyl Estradiol (SEASONIQUE) 0.15-0.03 &0.01 MG tablet Take 1 tablet by mouth daily.    . Melatonin 3 MG TABS Take 3 mg by mouth at bedtime. For insomnia    . montelukast (SINGULAIR) 10 MG tablet Take one tablet each evening to prevent cough or wheeze. (Patient taking differently: Take 10 mg by mouth at bedtime. ) 30 tablet 2  . ondansetron (ZOFRAN) 4 MG tablet Take 1 tablet (4 mg total) by mouth every 6 (six) hours. 12  tablet 0  . predniSONE (STERAPRED UNI-PAK 48 TAB) 10 MG (48) TBPK tablet Take as directed. (Patient not taking: Reported on 03/19/2017) 48 tablet 0  . ranitidine (ZANTAC) 150 MG capsule Take 1 capsule (150 mg total) 2 (two) times daily by mouth. 180 capsule 3  . senna (SENOKOT) 8.6 MG tablet Take 1 tablet by mouth daily.    Marland Kitchen torsemide (DEMADEX) 10 MG tablet Take 1.5 tablets (15 mg total) by mouth daily. 45 tablet 0  . traZODone (DESYREL) 50 MG tablet Take 2 tablets (100 mg total) by mouth at bedtime. 62  tablet 5   No current facility-administered medications for this visit.     Allergies: Abilify [aripiprazole]; Seroquel [quetiapine fumarate]; Doxycycline; Amoxicillin-pot clavulanate; and Keppra [levetiracetam]  Past Medical History:  Diagnosis Date  . Allergy   . Asthma   . Autism    mom states no actual dx, but admits she has some symptoms  . Bronchitis   . Constipation   . Frequency-urgency syndrome     some incontinence ; wears pull-ups  . GERD (gastroesophageal reflux disease)   . Interstitial cystitis   . Mental deficiency    hx of aggressive behavior,impaired speech   . Pneumonia   . PONV (postoperative nausea and vomiting)   . Seizure (HCC)    most recent sz " at the end of summer"-last sz years ago per mother  . Sleep apnea    needing to order CPAP    Past Surgical History:  Procedure Laterality Date  . CYSTOSCOPY    . CYSTOSCOPY WITH HYDRODISTENSION AND BIOPSY  04/14/2012   Procedure: CYSTOSCOPY/BIOPSY/HYDRODISTENSION;  Surgeon: Lindaann Slough, MD;  Location: [facility];  Service: Urology;;  instillation of marcaine and pyridium  . TONSILLECTOMY      Family History  Problem Relation Age of Onset  . Seizures Mother   . Seizures Sister        1 sister has  . Pancreatic cancer Sister   . Colon cancer Neg Hx   . Colon polyps Neg Hx   . Esophageal cancer Neg Hx   . Rectal cancer Neg Hx   . Stomach cancer Neg Hx     Social History   Tobacco Use  . Smoking status: Never Smoker  . Smokeless tobacco: Never Used  Substance Use Topics  . Alcohol use: No    Alcohol/week: 0.0 oz    Subjective:  Patient is very pleasant, limited verbal skills, wheelchair bound; accompanied by her mother and sister; mother provides the history. Patient has been sick since mid-December with bronchitis/ respiratory infections; seen at the ER multiple times in the past few days due to recent seizure activity; does have a follow-up with her neurologist for EEG in the  next week; mother notes patient was "burning hot" this morning and she is "just really worried that something is bad wrong." Mother is requesting Rx for Levaquin 750 mg- states this is the only thing that will get her daughter better. Patient has completed Omnicef and Levaquin 500 mg in the past 3 weeks; CT done at ER yesterday did show right sinus disease but otherwise normal; labs from ER yesterday concerning for elevated blood glucose but patient has taken numerous rounds of oral steroids and elevated creatinine level ( ? Mild AKI); she is tachycardic in office today at 121;   Objective:  Vitals:   03/19/17 1451  BP: 108/78  Pulse: (!) 121  Temp: 97.9 F (36.6 C)  SpO2: 97%  General: Well developed, well nourished, in no acute distress  Skin : Warm and dry.  Head: Normocephalic and atraumatic  Eyes: Sclera and conjunctiva clear; pupils round and reactive to light; extraocular movements intact  Ears: External normal; canals clear; tympanic membranes normal  Oropharynx: Pink, supple. No suspicious lesions  Neck: Supple without thyromegaly, adenopathy  Lungs: Respirations unlabored; clear to auscultation bilaterally without wheeze, rales, rhonchi  CVS exam: normal rate, regular rhythm, normal S1, S2, no murmurs, rubs, clicks or gallops, tachycardic.  Neurologic: Patient smiles in response to attention; limited verbal skills; wheelchair bound;   Assessment:  1. Tachycardia   2. AKI (acute kidney injury) (HCC)   3. Elevated glucose     Plan:  Patient is seen at 3 pm and am unable to get follow-up labs today; based on elevated heart rate and mother's notice of fever this morning, am concerned for possible systemic reaction ( early sepsis vs dehydration); even thought patient appears well in office, recommend ER evaluation again for stat repeat labs, IV fluids and evaluation for admission; mother expressed understanding and agrees; ER notified that patient is coming for evaluation.   No  Follow-up on file.  No orders of the defined types were placed in this encounter.   Requested Prescriptions    No prescriptions requested or ordered in this encounter

## 2017-03-21 DIAGNOSIS — R296 Repeated falls: Secondary | ICD-10-CM | POA: Insufficient documentation

## 2017-03-21 DIAGNOSIS — I872 Venous insufficiency (chronic) (peripheral): Secondary | ICD-10-CM | POA: Insufficient documentation

## 2017-03-21 DIAGNOSIS — R531 Weakness: Secondary | ICD-10-CM | POA: Insufficient documentation

## 2017-03-21 NOTE — Assessment & Plan Note (Signed)
Given my limited number of interactions with her it is not clear if she is in her normal state of health or not. Her mother feels like she is not doing much and weak and not wanting to walk and is using wheelchair with her at home.

## 2017-03-21 NOTE — Assessment & Plan Note (Signed)
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 She is advised to stop all steroids and do not resume. We should see improvement in 1-2 weeks with weakness. Ordered home health to work with her individually in the home for strength and safety to prevent injury from fall and assess baseline capacity and improvement potential.

## 2017-03-21 NOTE — Assessment & Plan Note (Signed)
Will give rx for levaquin 750 mg daily for 2 weeks. Will stop steroids as she could have steroid induced myopathy (mildly high CK).

## 2017-03-21 NOTE — Assessment & Plan Note (Signed)
Some of these sound like she is sitting down. It is unclear to me if she is getting SOB while walking, muscle weakness, if this is sequelae of recent seizures. She did have mildly high CK recently which could be seizure related or related to her prednisone therapy recently which can cause myopathy. Will get PT to work with her to see if we can help improve her walking and reduce her reliance on wheelchair at home.

## 2017-03-21 NOTE — Assessment & Plan Note (Signed)
Low BP could also be contributing to her instability and falls. Will stop torsemide altogether. The mother does not recall why it was started except some leg swelling. Will give rx for compression stockings and remove torsemide from her medication list. She is asked to stop that today.

## 2017-03-21 NOTE — Assessment & Plan Note (Signed)
She will continue to see her neurologist and changed depakote to 200 qam and 400 qpm. She will follow up with them for eeg on Tuesday to look for underlying seizure activity which could be affecting her.

## 2017-03-25 ENCOUNTER — Telehealth: Payer: Self-pay | Admitting: Cardiology

## 2017-03-25 ENCOUNTER — Ambulatory Visit (HOSPITAL_COMMUNITY)
Admission: RE | Admit: 2017-03-25 | Discharge: 2017-03-25 | Disposition: A | Discharge status: Home / Self Care | Payer: Medicare Other | Source: Ambulatory Visit | Attending: Family | Admitting: Family

## 2017-03-25 DIAGNOSIS — G40209 Localization-related (focal) (partial) symptomatic epilepsy and epileptic syndromes with complex partial seizures, not intractable, without status epilepticus: Secondary | ICD-10-CM

## 2017-03-25 DIAGNOSIS — R569 Unspecified convulsions: Secondary | ICD-10-CM

## 2017-03-25 DIAGNOSIS — G40309 Generalized idiopathic epilepsy and epileptic syndromes, not intractable, without status epilepticus: Secondary | ICD-10-CM

## 2017-03-25 NOTE — Progress Notes (Signed)
EEG completed; results pending.    

## 2017-03-25 NOTE — Telephone Encounter (Signed)
Mom calling in to report that dtr is having seizures again, started back up in December.  She also kept falling. Mother would like Dr. Elberta Fortisamnitz to review pt's chart.  Pt has had multiple admissions since 02/2017 and mother reports HRs have been elevated. Mother also reports pt has been having to take steroids also.  (Upon reviewing chart - multiple (4) viral adx during December.  Another adx for wheezing. Another adx for malaise. And one adx at the end of the December for seizure-like activity.  Pt had one adx this month for seizure-like activity and then another one for orthostasis)  Educated mother that viral diseases, seizures, malaise and steroids may all have played a factor in increased HRs to pt. Advised that I would forward to Dr. Elberta Fortisamnitz for review and advisement, if any, he may have. She understands I will call her by the end of the week once he has reviewed. Mom is appreciative and agreeable to plan.

## 2017-03-25 NOTE — Telephone Encounter (Signed)
New Message   Patients mother Lori Miles is calling. She is requesting a call back after 10am. She would not provide any details as why. She just states its private.

## 2017-03-26 NOTE — Procedures (Signed)
Patient: Lori SchickJasmine Miles MRN: [id number] Sex: female DOB: Jul 22, 1995  Clinical History: Lori CellaJasmine is a 22 y.o. with history of seizures in the past.  She has had recurrent falls over the past 2 weeks where she will become weak arms and legs are trembling she would appear to be unable to stand and fall.  She is also had activity where she her face will twitch.  This study is performed to look for the presence of seizures..  Medications: none  Procedure: The tracing is carried out on a 32-channel digital Natus recorder, reformatted into 16-channel montages with 1 devoted to EKG.  The patient was awake during the recording.  The international 10/20 system lead placement used.  Recording time 25 minutes.   Description of Findings: Dominant frequency is 25 V, 8 hz, alpha range activity that is well modulated and well regulated, posteriorly and symmetrically distributed, and attenuates with eye-opening.    Background activity consists of under 10 V theta and upper delta range activity with frontally predominant beta range activity.  There was no interictal epileptiform activity in the form of spikes or sharp waves..  Activating procedures included intermittent photic stimulation, and hyperventilation.  Intermittent photic stimulation induced a driving response at 9 and 11 hz.  Hyperventilation caused no significant background change.  EKG showed a sinus tachycardia with a ventricular response of 102 beats per minute.  Impression: This is a normal record with the patient awake.  A normal EEG does not rule out the presence of seizures.  Lori CarwinWilliam Faith Branan, MD

## 2017-03-27 ENCOUNTER — Ambulatory Visit (INDEPENDENT_AMBULATORY_CARE_PROVIDER_SITE_OTHER): Payer: Medicare Other | Admitting: Family

## 2017-03-27 ENCOUNTER — Encounter (INDEPENDENT_AMBULATORY_CARE_PROVIDER_SITE_OTHER): Payer: Self-pay | Admitting: Family

## 2017-03-27 VITALS — BP 110/80 | HR 100 | Ht <= 58 in | Wt 195.4 lb

## 2017-03-27 DIAGNOSIS — G40309 Generalized idiopathic epilepsy and epileptic syndromes, not intractable, without status epilepticus: Secondary | ICD-10-CM | POA: Diagnosis not present

## 2017-03-27 DIAGNOSIS — F819 Developmental disorder of scholastic skills, unspecified: Secondary | ICD-10-CM | POA: Diagnosis not present

## 2017-03-27 DIAGNOSIS — R296 Repeated falls: Secondary | ICD-10-CM

## 2017-03-27 DIAGNOSIS — G40209 Localization-related (focal) (partial) symptomatic epilepsy and epileptic syndromes with complex partial seizures, not intractable, without status epilepticus: Secondary | ICD-10-CM

## 2017-03-27 DIAGNOSIS — R531 Weakness: Secondary | ICD-10-CM

## 2017-03-27 DIAGNOSIS — R269 Unspecified abnormalities of gait and mobility: Secondary | ICD-10-CM | POA: Diagnosis not present

## 2017-03-27 NOTE — Progress Notes (Signed)
Patient: Lori Miles MRN: [id number] Sex: female DOB: 1995-12-01  Provider: Elveria Rising, NP Location of Care: [facility]  Note type: Routine return visit  History of Present Illness: Referral Source: Fleet Contras, MD History from: mother and [facility] chart Chief Complaint: EEG results  Lori Miles is a 22 y.o. woman with history of generalized convulsive and partial epilepsy, intellectual disabilities, and problems with sleep. She was last seen September 30, 2016. She returns today because she has been seen in the ER recently for possible seizure episodes. Mom described that Lori Miles has trouble walking, and when doing so is "jerking" and unusually weak. Lori Miles has also been sick with respiratory illness during this time. Mom contacted me by phone and reported seizures at home with Lori Miles jerking and being unable to walk. She had a Valproic Acid level drawn during one of her ER visits that revealed a level of 37 mcg/ml. The Depakote dose was increased because of the reports of seizures and the recent low Depakote level. She had an EEG performed earlier this week that was normal.  Mom tells me that Lori Miles is scheduled for a physical therapy evaluation tomorrow because of her recent difficulties walking. Lori Miles has been otherwise healthy and Mom has no other health concerns for Lori Miles today other than previously mentioned.   Review of Systems: Please see the HPI for neurologic and other pertinent review of systems. Otherwise, all other systems were reviewed and were negative.    Past Medical History:  Diagnosis Date  . Allergy   . Asthma   . Autism    mom states no actual dx, but admits she has some symptoms  . Bronchitis   . Constipation   . Frequency-urgency syndrome     some incontinence ; wears pull-ups  . GERD (gastroesophageal reflux disease)   . Interstitial cystitis   . Mental deficiency    hx of aggressive behavior,impaired speech   . Pneumonia   .  PONV (postoperative nausea and vomiting)   . Seizure (HCC)    most recent sz " at the end of summer"-last sz years ago per mother  . Sleep apnea    needing to order CPAP   Hospitalizations: No., Head Injury: No., Nervous System Infections: No., Immunizations up to date: Yes.   Past Medical History Comments: EEG on September 14, 2011, was normal. She was able to be successfully weaned off Keppra. The patient has significant issues with aggression, which was thought to be related to Keppra, but has persisted once it was discontinued. The patient was treated with Depakote both for behavior and possible seizures. She had palpitations and severe constipation on Seroquel. She had marked weight gain on Abilify and Depakote. When generic Divalproex was tried she had breakthrough seizures.   Nocturnal polysomnogram on October 27, 2011, failed to show significant periodic limb movements, cyanosis, sleep apnea, or cardiac arrhythmia. EEG on February 20, 2012, was a normal study with the patient awake.   EEG performed March 25, 2017 was normal.  Surgical History Past Surgical History:  Procedure Laterality Date  . CYSTOSCOPY    . CYSTOSCOPY WITH HYDRODISTENSION AND BIOPSY  04/14/2012   Procedure: CYSTOSCOPY/BIOPSY/HYDRODISTENSION;  Surgeon: Lindaann Slough, MD;  Location: [facility];  Service: Urology;;  instillation of marcaine and pyridium  . TONSILLECTOMY      Family History family history includes Pancreatic cancer in her sister; Seizures in her mother and sister. Family History is otherwise negative for migraines, seizures, cognitive impairment, blindness,  deafness, birth defects, chromosomal disorder, autism.  Social History Social History   Socioeconomic History  . Marital status: Single    Spouse name: None  . Number of children: None  . Years of education: None  . Highest education level: None  Social Needs  . Financial resource strain: None  . Food insecurity - worry:  None  . Food insecurity - inability: None  . Transportation needs - medical: None  . Transportation needs - non-medical: None  Occupational History  . None  Tobacco Use  . Smoking status: Never Smoker  . Smokeless tobacco: Never Used  Substance and Sexual Activity  . Alcohol use: No    Alcohol/week: 0.0 oz  . Drug use: No  . Sexual activity: No    Birth control/protection: Pill  Other Topics Concern  . None  Social History Narrative   Lives with mom (Lori Miles) and half-sister Lori Miles) who is also mentally impaired.  Has CAP assistance, urinary incontinence, needs help with feeding,dressing, toileting   Graduated from [employer].  She enjoys playing with cars, dancing, and dogs.    Allergies Allergies  Allergen Reactions  . Abilify [Aripiprazole] Swelling and Palpitations  . Seroquel [Quetiapine Fumarate] Palpitations  . Doxycycline Other (See Comments)    Stomach cramps  . Amoxicillin-Pot Clavulanate Hives  . Keppra [Levetiracetam] Other (See Comments)    Aggressive behavior     Physical Exam BP 110/80   Pulse 100   Ht 4\' 9"  (1.448 m)   Wt 195 lb 6.4 oz (88.6 kg)   BMI 42.28 kg/m  General: well developed, well nourished obese young woman, seated in exam room, in no evident distress; black hair, brown eyes, right handed Head: normocephalic and atraumatic. Oropharynx benign. No dysmorphic features. Neck: supple with no carotid bruits. No focal tenderness. Cardiovascular: regular rate and rhythm, no murmurs. Respiratory: Clear to auscultation bilaterally Abdomen: Bowel sounds present all four quadrants, abdomen soft, non-tender, non-distended. No hepatosplenomegaly or masses palpated. Musculoskeletal: No skeletal deformities or obvious scoliosis Skin: no rashes or neurocutaneous lesions  Neurologic Exam Mental Status: Awake and fully alert.  Attention span, concentration, and fund of knowledge subnormal for age.  Speech with dysarthria.   Able to  follow simple commands and participate in examination. She is impulsive and immature for her age. Cranial Nerves: Fundoscopic exam - red reflex present.  Unable to fully visualize fundus.  Pupils equal briskly reactive to light.  Extraocular movements full without nystagmus.  Visual fields full to confrontation.  Hearing intact and symmetric to finger rub.  Facial sensation intact.  Face, tongue, palate move normally and symmetrically.  Neck flexion and extension normal. Motor: Normal bulk and tone.  Normal strength in all tested extremity muscles. Sensory: Intact to touch and temperature in all extremities. Coordination: Rapid movements: finger and toe tapping normal and symmetric bilaterally.  Finger-to-nose and heel-to-shin intact bilaterally.  Able to balance on either foot. Romberg negative. Gait and Station: Arises from chair, without difficulty. Stance is normal.  Gait broad based and unsteady. Balance is poor.  Reflexes: Diminished and symmetric. Toes downgoing. No clonus.  Impression 1.  Partial epilepsy with impairment of consciousness 2.  Generalized convulsive epilepsy 3.  Insomnia 4.  Intellectual disability 5.  Sleep arousals 6.  Excessive daytime sleepiness 7.  Gait disorder  Recommendations for plan of care The patient's previous [facility] records were reviewed. Lori Miles has neither had nor required imaging or lab studies since the last visit. She had an EEG done  March 25, 2017 that was normal and I reviewed that with Lori Miles's mother. I talked with Mom about the recent ER visits and about Oneisha's difficulty walking. I told Mom that Lori Miles needs to be drinking more fluids, and stressed that she needs sugar and caffeine free fluids. Lori Miles has a very unsteady gait and I gave Mom an order to get a lightweight folding wheelchair. I also asked Mom to talk with her physical therapist about her gait and also asked Mom to ask the physical therapist to send me the report of her evaluation.   I will see Lori Miles back in follow up in 1 month. Mom agreed with the plans made today.   The medication list was reviewed and reconciled.  No changes were made in the prescribed medications today.  A complete medication list was provided to the patient's mother.  Allergies as of 03/27/2017      Reactions   Abilify [aripiprazole] Swelling, Palpitations   Seroquel [quetiapine Fumarate] Palpitations   Doxycycline Other (See Comments)   Stomach cramps   Amoxicillin-pot Clavulanate Hives   Keppra [levetiracetam] Other (See Comments)   Aggressive behavior       Medication List        Accurate as of 03/27/17 11:59 PM. Always use your most recent med list.          acetaminophen 325 MG tablet Commonly known as:  TYLENOL Take 2 tablets (650 mg total) by mouth every 6 (six) hours as needed for mild pain or moderate pain.   albuterol (2.5 MG/3ML) 0.083% nebulizer solution Commonly known as:  PROVENTIL Inhale the contents of one vial in nebulizer every four to six hours as needed for cough or wheeze.   benzonatate 100 MG capsule Commonly known as:  TESSALON Take 1-2 capsules (100-200 mg total) by mouth 3 (three) times daily as needed for cough.   budesonide 1 MG/2ML nebulizer solution Commonly known as:  PULMICORT Take 2 mLs (1 mg total) by nebulization 2 (two) times daily.   cetirizine 10 MG tablet Commonly known as:  ZYRTEC Take one tablet daily for runny nose or itching.   cloNIDine 0.1 MG tablet Commonly known as:  CATAPRES take 1 and 1/2 tablet by mouth at bedtime   Cranberry 500 MG Caps Take 500 mg by mouth daily. Reported on 09/07/2015   DEPAKOTE 500 MG DR tablet Generic drug:  divalproex Give 1 tablet in the morning and 2 tablets at night   fluconazole 200 MG tablet Commonly known as:  DIFLUCAN TAKE 1 TABLET ONCE AS DIRECTED AND MAY REPEAT IN 48 TO 72 HOURS.   formoterol 20 MCG/2ML nebulizer solution Commonly known as:  PERFOROMIST Take 2 mLs (20 mcg total) by  nebulization 2 (two) times daily.   hyoscyamine 0.125 MG SL tablet Commonly known as:  LEVSIN SL DISSOLVE 0.125mg  TABLET UNDER THE TONGUE 2 TIMES A DAY   levofloxacin 750 MG tablet Commonly known as:  LEVAQUIN Take 1 tablet (750 mg total) by mouth daily.   Melatonin 3 MG Tabs Take 3 mg by mouth at bedtime. For insomnia   montelukast 10 MG tablet Commonly known as:  SINGULAIR Take one tablet each evening to prevent cough or wheeze.   ondansetron 4 MG tablet Commonly known as:  ZOFRAN Take 1 tablet (4 mg total) by mouth every 6 (six) hours.   ranitidine 150 MG capsule Commonly known as:  ZANTAC Take 1 capsule (150 mg total) 2 (two) times daily by mouth.   SEASONIQUE 0.15-0.03 &  0.01 MG tablet Generic drug:  Levonorgestrel-Ethinyl Estradiol Take 1 tablet by mouth daily.   senna 8.6 MG tablet Commonly known as:  SENOKOT Take 1 tablet by mouth daily.   traZODone 50 MG tablet Commonly known as:  DESYREL Take 2 tablets (100 mg total) by mouth at bedtime.       [name] was consulted regarding the patient.   Total time spent with the patient was 30 minutes, of which 50% or more was spent in counseling and coordination of care.   Elveria Rising NP-C

## 2017-03-27 NOTE — Telephone Encounter (Signed)
Advised mom that Dr. Elberta Fortisamnitz reviewed chart/ekgs and he is not concerned from cardiology standpoint, at this time.   Informed that sinus tach most likely r/t current medical issues reported. She does not need to see him at this time.  Mom appreciates me calling and letting them know, she just wanted to make sure there was nothing else concerning.

## 2017-03-27 NOTE — Patient Instructions (Signed)
Thank you for coming in today.   Instructions for you until your next appointment are as follows: 1. Continue giving Thurza's medications as you have been giving them.  2. Calyssa needs to be drinking plenty of fluids each day. Water is best but she can also drink sugar free and caffeine fluids as well. Madisynn needs to be drinking at least 48-60 oz of fluid each day.  3. When [name] is evaluated by the physical therapist tomorrow, please ask the therapist to send me a copy of the evaluation.  4. Also, please ask the therapist if AFO's (leg braces) would be helpful for Danila. 5. I have given you an order for a lightweight folding wheelchair. Give the order to the therapist and see if they can help you with that.  6. I cancelled the appointment for January 15th with me. Instead, please plan to return in 1 month for follow up.

## 2017-03-29 ENCOUNTER — Other Ambulatory Visit: Payer: Self-pay

## 2017-03-29 ENCOUNTER — Emergency Department (HOSPITAL_COMMUNITY): Payer: Medicare Other

## 2017-03-29 ENCOUNTER — Encounter (HOSPITAL_COMMUNITY): Payer: Self-pay | Admitting: Emergency Medicine

## 2017-03-29 ENCOUNTER — Emergency Department (HOSPITAL_COMMUNITY)
Admission: EM | Admit: 2017-03-29 | Discharge: 2017-03-29 | Disposition: A | Discharge status: Home / Self Care | Payer: Medicare Other | Attending: Emergency Medicine | Admitting: Emergency Medicine

## 2017-03-29 DIAGNOSIS — R251 Tremor, unspecified: Secondary | ICD-10-CM | POA: Insufficient documentation

## 2017-03-29 DIAGNOSIS — R21 Rash and other nonspecific skin eruption: Secondary | ICD-10-CM | POA: Insufficient documentation

## 2017-03-29 DIAGNOSIS — Z79899 Other long term (current) drug therapy: Secondary | ICD-10-CM | POA: Diagnosis not present

## 2017-03-29 DIAGNOSIS — J45909 Unspecified asthma, uncomplicated: Secondary | ICD-10-CM | POA: Insufficient documentation

## 2017-03-29 LAB — URINALYSIS, ROUTINE W REFLEX MICROSCOPIC
Bilirubin Urine: NEGATIVE
Glucose, UA: 50 mg/dL — AB
HGB URINE DIPSTICK: NEGATIVE
Ketones, ur: 5 mg/dL — AB
Leukocytes, UA: NEGATIVE
NITRITE: NEGATIVE
Protein, ur: NEGATIVE mg/dL
Specific Gravity, Urine: 1.009 (ref 1.005–1.030)
pH: 7 (ref 5.0–8.0)

## 2017-03-29 LAB — CBC WITH DIFFERENTIAL/PLATELET
Basophils Absolute: 0 10*3/uL (ref 0.0–0.1)
Basophils Relative: 0 %
EOS ABS: 0 10*3/uL (ref 0.0–0.7)
EOS PCT: 0 %
HCT: 35.9 % — ABNORMAL LOW (ref 36.0–46.0)
HEMOGLOBIN: 11.7 g/dL — AB (ref 12.0–15.0)
LYMPHS ABS: 3.4 10*3/uL (ref 0.7–4.0)
Lymphocytes Relative: 38 %
MCH: 30.8 pg (ref 26.0–34.0)
MCHC: 32.6 g/dL (ref 30.0–36.0)
MCV: 94.5 fL (ref 78.0–100.0)
MONO ABS: 0.9 10*3/uL (ref 0.1–1.0)
MONOS PCT: 10 %
NEUTROS PCT: 52 %
Neutro Abs: 4.5 10*3/uL (ref 1.7–7.7)
Platelets: 224 10*3/uL (ref 150–400)
RBC: 3.8 MIL/uL — ABNORMAL LOW (ref 3.87–5.11)
RDW: 15.3 % (ref 11.5–15.5)
WBC: 8.8 10*3/uL (ref 4.0–10.5)

## 2017-03-29 LAB — COMPREHENSIVE METABOLIC PANEL
ALK PHOS: 40 U/L (ref 38–126)
ALT: 17 U/L (ref 14–54)
ANION GAP: 10 (ref 5–15)
AST: 19 U/L (ref 15–41)
Albumin: 2.9 g/dL — ABNORMAL LOW (ref 3.5–5.0)
BUN: 7 mg/dL (ref 6–20)
CALCIUM: 8.6 mg/dL — AB (ref 8.9–10.3)
CO2: 23 mmol/L (ref 22–32)
Chloride: 106 mmol/L (ref 101–111)
Creatinine, Ser: 0.79 mg/dL (ref 0.44–1.00)
GFR calc Af Amer: >60 mL/min (ref >60)
GFR calc non Af Amer: >60 mL/min (ref >60)
Glucose, Bld: 106 mg/dL — ABNORMAL HIGH (ref 65–99)
POTASSIUM: 4.1 mmol/L (ref 3.5–5.1)
SODIUM: 139 mmol/L (ref 135–145)
Total Bilirubin: 0.8 mg/dL (ref 0.3–1.2)
Total Protein: 5.6 g/dL — ABNORMAL LOW (ref 6.5–8.1)

## 2017-03-29 LAB — AMMONIA: AMMONIA: 41 umol/L — AB (ref 9–35)

## 2017-03-29 LAB — VALPROIC ACID LEVEL: Valproic Acid Lvl: 60 ug/mL (ref 50.0–100.0)

## 2017-03-29 LAB — I-STAT CG4 LACTIC ACID, ED: Lactic Acid, Venous: 1.34 mmol/L (ref 0.5–1.9)

## 2017-03-29 LAB — CK: CK TOTAL: 80 U/L (ref 38–234)

## 2017-03-29 MED ORDER — SODIUM CHLORIDE 0.9 % IV BOLUS (SEPSIS)
1000.0000 mL | Freq: Once | INTRAVENOUS | Status: AC
Start: 2017-03-29 — End: 2017-03-29
  Administered 2017-03-29: 1000 mL via INTRAVENOUS

## 2017-03-29 MED ORDER — HYDROXYZINE HCL 25 MG PO TABS: 25.0000 mg | ORAL_TABLET | Freq: Four times a day (QID) | ORAL | 0 refills | Status: DC

## 2017-03-29 MED ORDER — SODIUM CHLORIDE 0.9 % IV BOLUS (SEPSIS)
1000.0000 mL | Freq: Once | INTRAVENOUS | Status: AC
  Administered 2017-03-29: 1000 mL via INTRAVENOUS

## 2017-03-29 NOTE — ED Notes (Signed)
Pure wick in place 

## 2017-03-29 NOTE — ED Provider Notes (Signed)
[facility] EMERGENCY DEPARTMENT Provider Note   CSN: [id number] Arrival date & time: 03/29/17  1357     History   Chief Complaint Chief Complaint  Patient presents with  . Shaking    HPI Lori Miles is a 22 y.o. female.  HPI Lori Miles is a 22 y.o. female with hx of MR, presents to ED with shaking episodes.  Mother is providing all the history.  Mother states that patient has started having this shaking episodes in both arms and legs approximately 1 month ago.  She has been to emergency department multiple times for the same and has seen a neurologist.  Had an outpatient EEG performed which did not show any epileptic activity, however mother is concerned of these still could be seizures.  She is here because "nobody will do anything about them."  She states that these episodes occur daily, sometimes multiple times a day.  She states today episode lasted an hour.  She states that she is certain that this is seizures.  She also reports patient has been sick with upper respiratory symptoms over the last month.  She has had steroids and antibiotics which she finished.  She states that she continues to have fever up to 104 at home.  She states that she had physical therapy 2 days ago and fell during 1 of these shaking episodes.  Mother states that in the last several days, patient has been having trouble walking due to this episodes and weakness.  She has had to come to emergency department to get IV fluids.  Mother is concerned because patient's skin and lips are always dry, but she states that she is drinking plenty of fluids.  Mother is also concerned about a spot on the patient's left lower leg that has a red rash on it, that patient has been scratching.  Past Medical History:  Diagnosis Date  . Allergy   . Asthma   . Autism    mom states no actual dx, but admits she has some symptoms  . Bronchitis   . Constipation   . Frequency-urgency syndrome     some  incontinence ; wears pull-ups  . GERD (gastroesophageal reflux disease)   . Interstitial cystitis   . Mental deficiency    hx of aggressive behavior,impaired speech   . Pneumonia   . PONV (postoperative nausea and vomiting)   . Seizure (HCC)    most recent sz " at the end of summer"-last sz years ago per mother  . Sleep apnea    needing to order CPAP    Patient Active Problem List   Diagnosis Date Noted  . Recurrent falls 03/21/2017  . Weakness 03/21/2017  . Venous insufficiency 03/21/2017  . Obstructive sleep apnea 09/30/2016  . Excessive daytime sleepiness 03/05/2016  . Acute bronchitis 05/05/2015  . Gait disorder 03/22/2015  . 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 Cough variant asthma vs UACS/ vcd  03/15/2015  . Autism spectrum disorder 02/14/2015  . Nausea & vomiting 02/14/2015  . Obesity, morbid (HCC) 12/13/2014  . 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 Acanthosis nigricans 11/14/2014  . Toeing-in 08/29/2014  . 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 Recurrent urinary tract infection 08/01/2011  . Asthma 08/01/2011  . Chronic constipation 08/01/2011  . Contraception 08/01/2011  Past Surgical History:  Procedure Laterality Date  . CYSTOSCOPY    . CYSTOSCOPY WITH HYDRODISTENSION AND BIOPSY  04/14/2012   Procedure: CYSTOSCOPY/BIOPSY/HYDRODISTENSION;  Surgeon: Lindaann Slough, MD;  Location: [facility];  Service: Urology;;  instillation of marcaine and pyridium  . TONSILLECTOMY      OB History    No data available       Home Medications    Prior to Admission medications   Medication Sig Start Date End Date Taking? Authorizing Provider  acetaminophen (TYLENOL) 325 MG tablet Take 2 tablets (650 mg total) by mouth  every 6 (six) hours as needed for mild pain or moderate pain. 02/27/17  Yes Everlene Farrier, PA-C  albuterol (PROVENTIL) (2.5 MG/3ML) 0.083% nebulizer solution Inhale the contents of one vial in nebulizer every four to six hours as needed for cough or wheeze. 04/23/16  Yes Kozlow, Alvira Philips, MD  budesonide (PULMICORT) 1 MG/2ML nebulizer solution Take 2 mLs (1 mg total) by nebulization 2 (two) times daily. 03/05/17  Yes Kozlow, Alvira Philips, MD  cetirizine (ZYRTEC) 10 MG tablet Take one tablet daily for runny nose or itching. 01/27/17  Yes Kozlow, Alvira Philips, MD  cloNIDine (CATAPRES) 0.1 MG tablet take 1 and 1/2 tablet by mouth at bedtime 02/12/17  Yes Goodpasture, Inetta Fermo, NP  Cranberry 500 MG CAPS Take 500 mg by mouth daily. Reported on 09/07/2015   Yes [provider]  DEPAKOTE 500 MG DR tablet Give 1 tablet in the morning and 2 tablets at night 03/19/17  Yes Goodpasture, Inetta Fermo, NP  formoterol (PERFOROMIST) 20 MCG/2ML nebulizer solution Take 2 mLs (20 mcg total) by nebulization 2 (two) times daily. 04/25/16  Yes Kozlow, Alvira Philips, MD  hyoscyamine (LEVSIN SL) 0.125 MG SL tablet DISSOLVE 0.125mg  TABLET UNDER THE TONGUE 2 TIMES A DAY 08/08/14  Yes [provider]  Levonorgestrel-Ethinyl Estradiol (SEASONIQUE) 0.15-0.03 &0.01 MG tablet Take 1 tablet by mouth daily.   Yes [provider]  Melatonin 3 MG TABS Take 3 mg by mouth at bedtime. For insomnia   Yes [provider]  montelukast (SINGULAIR) 10 MG tablet Take one tablet each evening to prevent cough or wheeze. Patient taking differently: Take 10 mg by mouth at bedtime.  02/25/17  Yes Kozlow, Alvira Philips, MD  ondansetron (ZOFRAN) 4 MG tablet Take 1 tablet (4 mg total) by mouth every 6 (six) hours. 12/31/15  Yes Tharon Aquas, PA  ranitidine (ZANTAC) 150 MG capsule Take 1 capsule (150 mg total) 2 (two) times daily by mouth. 01/24/17  Yes Pyrtle, Carie Caddy, MD  senna (SENOKOT) 8.6 MG tablet Take 1 tablet by mouth daily.   Yes [provider]   traZODone (DESYREL) 50 MG tablet Take 2 tablets (100 mg total) by mouth at bedtime. 11/07/16  Yes Elveria Rising, NP    Family History Family History  Problem Relation Age of Onset  . Seizures Mother   . Seizures Sister        1 sister has  . Pancreatic cancer Sister   . Colon cancer Neg Hx   . Colon polyps Neg Hx   . Esophageal cancer Neg Hx   . Rectal cancer Neg Hx   . Stomach cancer Neg Hx     Social History Social History   Tobacco Use  . Smoking status: Never Smoker  . Smokeless tobacco: Never Used  Substance Use Topics  . Alcohol use: No    Alcohol/week: 0.0 oz  . Drug use: No  Allergies   Abilify [aripiprazole]; Seroquel [quetiapine fumarate]; Doxycycline; Amoxicillin-pot clavulanate; and Keppra [levetiracetam]   Review of Systems Review of Systems  Unable to perform ROS: Psychiatric disorder     Physical Exam Updated Vital Signs BP 103/75 (BP Location: Left Arm)   Pulse (!) 111   Temp 98.6 F (37 C) (Oral)   Resp 18   SpO2 99%   Physical Exam  Constitutional: She appears well-developed and well-nourished. No distress.  HENT:  Head: Normocephalic.  Lips, tongue, mouth dry  Eyes: Conjunctivae are normal.  Neck: Normal range of motion. Neck supple.  Cardiovascular: Regular rhythm and normal heart sounds.  Tachycardic  Pulmonary/Chest: Effort normal and breath sounds normal. No respiratory distress. She has no wheezes. She has no rales.  Abdominal: Soft. Bowel sounds are normal. She exhibits no distension. There is no tenderness. There is no rebound.  Musculoskeletal: She exhibits no edema.  Neurological: She is alert.  Follow simple commands.  Skin: Skin is warm and dry.  Mild erythema, with no papules, vesicles, pustules to the left lower anterior shin.  Patient is scratching it.  There is several erythematous, nonblanching, approximately 0.5-1 cm in diameter areas of rash to the right posterior leg.  Psychiatric: She has a normal mood and  affect. Her behavior is normal.  Nursing note and vitals reviewed.    ED Treatments / Results  Labs (all labs ordered are listed, but only abnormal results are displayed) Labs Reviewed  CBC WITH DIFFERENTIAL/PLATELET - Abnormal; Notable for the following components:      Result Value   RBC 3.80 (*)    Hemoglobin 11.7 (*)    HCT 35.9 (*)    All other components within normal limits  COMPREHENSIVE METABOLIC PANEL - Abnormal; Notable for the following components:   Glucose, Bld 106 (*)    Calcium 8.6 (*)    Total Protein 5.6 (*)    Albumin 2.9 (*)    All other components within normal limits  URINALYSIS, ROUTINE W REFLEX MICROSCOPIC - Abnormal; Notable for the following components:   Color, Urine STRAW (*)    Glucose, UA 50 (*)    Ketones, ur 5 (*)    All other components within normal limits  AMMONIA - Abnormal; Notable for the following components:   Ammonia 41 (*)    All other components within normal limits  CK  VALPROIC ACID LEVEL  I-STAT CG4 LACTIC ACID, ED  I-STAT CG4 LACTIC ACID, ED    EKG  EKG Interpretation None       Radiology Dg Chest 2 View  Result Date: 03/29/2017 CLINICAL DATA:  Cough and fever EXAM: CHEST  2 VIEW COMPARISON:  03/13/2017 FINDINGS: Low lung volumes. No consolidation or effusion. Cardiomediastinal silhouette within normal limits. No pneumothorax. IMPRESSION: No active cardiopulmonary disease.  Low lung volumes. Electronically Signed   By: Janaiya Pang M.D.   On: 03/29/2017 19:21    Procedures Procedures (including critical care time)  Medications Ordered in ED Medications - No data to display   Initial Impression / Assessment and Plan / ED Course  I have reviewed the triage vital signs and the nursing notes.  Pertinent labs & imaging results that were available during my care of the patient were reviewed by me and considered in my medical decision making (see chart for details).     Patient in emergency department for  continuing to have shaking episodes.  She has been evaluated in the emergency department several times for the same  he has seen neurologist.  Patient appears to be dry, otherwise nontoxic.  Heart rate elevated at 111.  Will check some labs, electrolytes, kidney function, Depakote level.  Will give IV fluids and reassess.   9:36 PM Heart rate improved with IV fluids, now in the 80s.  Blood pressure, respiratory rate, oxygen saturation, all within normal.  Patient's labs are unremarkable.  Urine with no signs of infection.  I discussed with Dr. Dalene SeltzerSchlossman patient's rash, which is now visible in the right leg as well.  Considered bruising, given the rash is nonblanching, versus vasculitis versus meningitis, however doubt with no nuchal rigidity or fever.  Lactic acid and white blood cell count normal as well.  Will treat with Atarax for itching rather than Benadryl, since mother states that Benadryl is not helping.  We will have him follow closely with her family doctor.  Gust with mother importance of following up with neurology, regarding the shakes.  Mother agreed.   Vitals:   03/29/17 2100 03/29/17 2115 03/29/17 2120 03/29/17 2130  BP: 105/63 112/74  112/62  Pulse: 100 97  97  Resp:      Temp:   99.5 F (37.5 C)   TempSrc:   Oral   SpO2: 97% 99%  98%    Final Clinical Impressions(s) / ED Diagnoses   Final diagnoses:  Episode of shaking  Rash    ED Discharge Orders        Ordered    hydrOXYzine (ATARAX/VISTARIL) 25 MG tablet  Every 6 hours     03/29/17 2123       Jaynie CrumbleKirichenko, Ahmani Daoud, PA-C 03/29/17 2139    Alvira MondaySchlossman, Erin, MD 03/30/17 1410

## 2017-03-29 NOTE — ED Triage Notes (Signed)
Pt presents to ED with mom for shaking episodes with a hx of seizures, but patient has been having episodes of shaking, more pronounced in her right arm.  [name] has spoken to cardiologist, neurologist, and PCP, but states she wants a note from us showing that patient is having these episodes.

## 2017-03-29 NOTE — ED Notes (Signed)
Patient transported to X-ray 

## 2017-03-29 NOTE — ED Notes (Addendum)
[name] also pointed out a rash with dry, itchy skin to the right shin.

## 2017-03-29 NOTE — ED Notes (Signed)
Unable to start IV fluids d/t no IV access after unsuccessful attempts at IV starts by 2 RNs. Order for IV team placed. Patient very difficult stick.

## 2017-03-29 NOTE — Discharge Instructions (Signed)
Take atarax for itching instead of benadryl. Try cortisone cream for itching. Please follow up with neurology and family doctor for further testing. Continue to encourage oral fluids.

## 2017-03-29 NOTE — ED Notes (Signed)
IV Team at bedside at this time 

## 2017-03-29 NOTE — ED Notes (Signed)
Pt in room watching TV on phone not currently shaking

## 2017-03-31 ENCOUNTER — Telehealth: Payer: Self-pay | Admitting: Pulmonary Disease

## 2017-03-31 ENCOUNTER — Telehealth: Payer: Self-pay | Admitting: Internal Medicine

## 2017-03-31 ENCOUNTER — Encounter (INDEPENDENT_AMBULATORY_CARE_PROVIDER_SITE_OTHER): Payer: Self-pay | Admitting: Family

## 2017-03-31 ENCOUNTER — Telehealth: Payer: Self-pay | Admitting: Cardiology

## 2017-03-31 ENCOUNTER — Encounter (HOSPITAL_COMMUNITY): Payer: Self-pay | Admitting: Emergency Medicine

## 2017-03-31 ENCOUNTER — Other Ambulatory Visit: Payer: Self-pay

## 2017-03-31 DIAGNOSIS — R569 Unspecified convulsions: Secondary | ICD-10-CM | POA: Insufficient documentation

## 2017-03-31 DIAGNOSIS — J45909 Unspecified asthma, uncomplicated: Secondary | ICD-10-CM | POA: Insufficient documentation

## 2017-03-31 DIAGNOSIS — Z79899 Other long term (current) drug therapy: Secondary | ICD-10-CM | POA: Insufficient documentation

## 2017-03-31 DIAGNOSIS — F71 Moderate intellectual disabilities: Secondary | ICD-10-CM | POA: Insufficient documentation

## 2017-03-31 DIAGNOSIS — F840 Autistic disorder: Secondary | ICD-10-CM | POA: Insufficient documentation

## 2017-03-31 DIAGNOSIS — R5383 Other fatigue: Secondary | ICD-10-CM | POA: Diagnosis present

## 2017-03-31 LAB — BASIC METABOLIC PANEL
ANION GAP: 11 (ref 5–15)
BUN: 5 mg/dL — AB (ref 6–20)
CALCIUM: 8.6 mg/dL — AB (ref 8.9–10.3)
CO2: 20 mmol/L — AB (ref 22–32)
Chloride: 105 mmol/L (ref 101–111)
Creatinine, Ser: 0.92 mg/dL (ref 0.44–1.00)
GFR calc Af Amer: >60 mL/min (ref >60)
GFR calc non Af Amer: >60 mL/min (ref >60)
GLUCOSE: 182 mg/dL — AB (ref 65–99)
Potassium: 3.5 mmol/L (ref 3.5–5.1)
Sodium: 136 mmol/L (ref 135–145)

## 2017-03-31 LAB — CBG MONITORING, ED: GLUCOSE-CAPILLARY: 165 mg/dL — AB (ref 65–99)

## 2017-03-31 LAB — CBC
HCT: 33.9 % — ABNORMAL LOW (ref 36.0–46.0)
HEMOGLOBIN: 11.2 g/dL — AB (ref 12.0–15.0)
MCH: 30.7 pg (ref 26.0–34.0)
MCHC: 33 g/dL (ref 30.0–36.0)
MCV: 92.9 fL (ref 78.0–100.0)
Platelets: 195 10*3/uL (ref 150–400)
RBC: 3.65 MIL/uL — ABNORMAL LOW (ref 3.87–5.11)
RDW: 14.7 % (ref 11.5–15.5)
WBC: 8 10*3/uL (ref 4.0–10.5)

## 2017-03-31 LAB — VALPROIC ACID LEVEL: VALPROIC ACID LVL: 60 ug/mL (ref 50.0–100.0)

## 2017-03-31 LAB — I-STAT BETA HCG BLOOD, ED (MC, WL, AP ONLY)

## 2017-03-31 NOTE — Telephone Encounter (Signed)
Called and spoke with pt's mother, Lori Miles letting her know that the appt we have pt scheduled for with VS on 1/29 is the soonest we can get her in for an appt.  [name] expressed understanding and just wanted to make Lori Miles aware that pt's sleep is getting worse to where she is awake at night and is wanting to sleep during the day.  Stated to pt's mother that we will see pt on 04/15/17 and go from there to see if we can get pt back on CPAP after that visit.  [name] expressed understanding. Nothing further needed.

## 2017-03-31 NOTE — ED Triage Notes (Signed)
Pt arrives POV from home with seizures x1 today, seen by PCP, PT, neurologist who do not believe she's having seizures. Hx MR, pt's mother primary care provider at home.

## 2017-03-31 NOTE — Telephone Encounter (Signed)
Patient's mother calling about patient's recent hospital visit and about her PT visit on Friday.  Patient's mother stated patient's BP was 110/70 HR 102 when sitting and when PT got patient up standing her HR 130. Patient's mother wanted Dr. Elberta Fortisamnitz nurse to know about the increase heart rate when standing with PT. Will forward message to Roanna RaiderSherri so she is aware and can f/u.

## 2017-03-31 NOTE — Telephone Encounter (Signed)
[facility] is going to send an order over for a bed and wanted to let us know that PT has been to the house and OT is coming today. States that her daughter has been doing a lot of shaking and that she was told about tests to get done  EMG  and [facility] was not sure if she could get these ordered by you.

## 2017-03-31 NOTE — Telephone Encounter (Signed)
Copied from CRM [id number]. Topic: General - Other >> Mar 31, 2017  9:06 AM Debroah LoopLander, Lumin L wrote: Reason for CRM: Mom calling for a call back RE patient's care under [facility].

## 2017-03-31 NOTE — Telephone Encounter (Signed)
She should continue to see the neurologist for the shaking and if they feel she needs these tests they would [facility] them [facility] and make sure that they are done.

## 2017-03-31 NOTE — Telephone Encounter (Signed)
New message  Pt verbalized that she is calling for RN  Did not disclose reason

## 2017-04-01 ENCOUNTER — Telehealth (INDEPENDENT_AMBULATORY_CARE_PROVIDER_SITE_OTHER): Payer: Self-pay | Admitting: Family

## 2017-04-01 ENCOUNTER — Ambulatory Visit (INDEPENDENT_AMBULATORY_CARE_PROVIDER_SITE_OTHER): Payer: Medicare Other | Admitting: Family

## 2017-04-01 ENCOUNTER — Emergency Department (HOSPITAL_COMMUNITY)
Admission: EM | Admit: 2017-04-01 | Discharge: 2017-04-01 | Disposition: A | Discharge status: Home / Self Care | Payer: Medicare Other | Attending: Emergency Medicine | Admitting: Emergency Medicine

## 2017-04-01 ENCOUNTER — Telehealth (INDEPENDENT_AMBULATORY_CARE_PROVIDER_SITE_OTHER): Payer: Self-pay | Admitting: Neurology

## 2017-04-01 DIAGNOSIS — G40309 Generalized idiopathic epilepsy and epileptic syndromes, not intractable, without status epilepticus: Secondary | ICD-10-CM

## 2017-04-01 DIAGNOSIS — G478 Other sleep disorders: Secondary | ICD-10-CM

## 2017-04-01 DIAGNOSIS — F819 Developmental disorder of scholastic skills, unspecified: Secondary | ICD-10-CM

## 2017-04-01 DIAGNOSIS — R569 Unspecified convulsions: Secondary | ICD-10-CM

## 2017-04-01 DIAGNOSIS — G40209 Localization-related (focal) (partial) symptomatic epilepsy and epileptic syndromes with complex partial seizures, not intractable, without status epilepticus: Secondary | ICD-10-CM

## 2017-04-01 NOTE — Telephone Encounter (Signed)
I reviewed your note and agree with this plan. 

## 2017-04-01 NOTE — Addendum Note (Signed)
Addended by: Princella IonGOODPASTURE, Tinamarie Przybylski P on: 04/01/2017 02:31 PM   Modules accepted: Orders

## 2017-04-01 NOTE — Discharge Instructions (Signed)
It is not clear if she is having seizures at this time.  She will need to follow-up with neurology for further studies.  This can be through her normal outpatient neurologist, [facility] and epilepsy has been provided as well.

## 2017-04-01 NOTE — ED Notes (Signed)
ED Provider at bedside. 

## 2017-04-01 NOTE — ED Notes (Signed)
[name] reports she wants to see another provider or the charge nurse. [name] reports she will not leave until she sees someone. Pt and pt's family have already received discharge papers and this RN went over them with him.

## 2017-04-01 NOTE — Telephone Encounter (Signed)
I attempted to call Mom but did not receive an answer. I will try again later. TG

## 2017-04-01 NOTE — Telephone Encounter (Signed)
Who's calling (name and relationship to patient) : Mother/[name]  Best contact number: [phone]  Provider they see: Sula Sodaina G.  Reason for call: symptomatic.request for health information Caller states her daughter is having seizures. NP [name] ran a test on this past Friday that states she hasn't been having them, but she has.  Result: 03/31/17  @ 703pm - spoke to On Call Provider   Call ID:  [phone]

## 2017-04-01 NOTE — Telephone Encounter (Addendum)
I called and talked with Mom. She said that [facility] for seizures. Mom said that [facility] was asleep, then went into deep sleep with heavy breathing, had jerking movements of her body, then one eye rolled up and stayed there until she awakened. Mom said that when she awakened that she was confused and then returned to deep sleep. Mom also says that Lori Miles has been having more jerking movements of her body and extremities during the day and is having more trouble walking. She is sleeping very poorly at night, awakening frequently gasping, then sleeps excessively during the day. Lori Miles has pulmonary appointment to follow up on sleep difficulties on January 29th. She was evaluated by PT yesterday and Mom said that the PT had great difficulty getting her awake to do the evaluation. Mom said that the PT noted that [facility] had difficulty walking and was unsafe walking without assistance. She also recommended hospital bed because of her limited ability to move herself, her swollen legs and feet, and her excessive sleeping (being in the bed more than expected) during the day. Mom said that she hadn't been able to video any of the episodes because of dealing with Ronan. Mom is convinced that what she is seeing is seizures. I attempted to explain to Mom that all abnormal movements are not seizures. However since she is having behaviors both awake and asleep, I recommended to Mom that we perform a prolonged video EEG and Mom agreed with that. I told her that I would order it through [facility] and explained that process. I asked Mom to continue to try to get video of her behaviors and to let me know if she did. Mom agreed with these plans.

## 2017-04-01 NOTE — Telephone Encounter (Signed)
°  Who's calling (name and relationship to patient) : Lonie Peakcquanetta, mother Best contact number: [phone] Provider they see:  Reason for call: [facility] report 03/31/2017 at 7:03pm. Caller states her daughter is having a seizure. [name] ran a test for this on Friday that states patient has not been having seizures, but she has. Dr Nab on call. [facility] report stated on call was reached.  ID [id number]     PRESCRIPTION REFILL ONLY  Name of prescription:  Pharmacy:

## 2017-04-01 NOTE — ED Provider Notes (Signed)
[facility] Note   CSN: [id number] Arrival date & time: 03/31/17  2005     History   Chief Complaint Chief Complaint  Patient presents with  . Seizures    HPI [name] is a 22 y.o. female.   Patient was brought to the emergency department by her parents for evaluation of possible seizures.  Mother reports that she does have a seizure history, takes Depakote.  She has been seen in the ER and by multiple other physicians recently for increased shaking behavior that mother is concerned his seizures.  She had episodes today and mother also reports that she has been more tired today, could not perform her physical therapy because she would not get out of bed.  She slept much of the day.      Past Medical History:  Diagnosis Date  . Allergy   . Asthma   . Autism    mom states no actual dx, but admits she has some symptoms  . Bronchitis   . Constipation   . Frequency-urgency syndrome     some incontinence ; wears pull-ups  . GERD (gastroesophageal reflux disease)   . Interstitial cystitis   . Mental deficiency    hx of aggressive behavior,impaired speech   . Pneumonia   . PONV (postoperative nausea and vomiting)   . Seizure (HCC)    most recent sz " at the end of summer"-last sz years ago per mother  . Sleep apnea    needing to order CPAP    Patient Active Problem List   Diagnosis Date Noted  . Recurrent falls 03/21/2017  . Weakness 03/21/2017  . Venous insufficiency 03/21/2017  . Obstructive sleep apnea 09/30/2016  . Excessive daytime sleepiness 03/05/2016  . Acute bronchitis 05/05/2015  . Gait disorder 03/22/2015  . 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 Cough variant asthma vs UACS/ vcd  03/15/2015  . Autism spectrum disorder 02/14/2015  . Nausea & vomiting 02/14/2015  . Obesity, morbid (HCC) 12/13/2014  . 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 Insomnia due to mental disorder  12/13/2014  . 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 Acanthosis nigricans 11/14/2014  . Toeing-in 08/29/2014  . 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 Recurrent urinary tract infection 08/01/2011  . Asthma 08/01/2011  . Chronic constipation 08/01/2011  . Contraception 08/01/2011    Past Surgical History:  Procedure Laterality Date  . CYSTOSCOPY    . CYSTOSCOPY WITH HYDRODISTENSION AND BIOPSY  04/14/2012   Procedure: CYSTOSCOPY/BIOPSY/HYDRODISTENSION;  Surgeon: Lindaann SloughMarc-Henry Nesi, MD;  [facility];  Service: Urology;;  instillation of marcaine and pyridium  . TONSILLECTOMY      OB History    No data available       Home Medications    Prior to Admission medications   Medication Sig Start Date End Date Taking? Authorizing Provider  acetaminophen (TYLENOL) 325 MG tablet Take 2 tablets (650 mg total) by mouth every 6 (six) hours as needed for mild pain or moderate pain. 02/27/17   Everlene Farrieransie, William, PA-C  albuterol (PROVENTIL) (2.5 MG/3ML) 0.083% nebulizer solution Inhale the contents of one vial in nebulizer every four to six hours as needed for cough or wheeze. 04/23/16   Kozlow, Alvira PhilipsEric J, MD  budesonide (PULMICORT) 1 MG/2ML nebulizer solution Take 2 mLs (1 mg total) by nebulization 2 (two) times daily. 03/05/17   Kozlow,  Alvira Philips, MD  cetirizine (ZYRTEC) 10 MG tablet Take one tablet daily for runny nose or itching. 01/27/17   Kozlow, Alvira Philips, MD  cloNIDine (CATAPRES) 0.1 MG tablet take 1 and 1/2 tablet by mouth at bedtime 02/12/17   Elveria Rising, NP  Cranberry 500 MG CAPS Take 500 mg by mouth daily. Reported on 09/07/2015    [provider]  DEPAKOTE 500 MG DR tablet Give 1 tablet in the morning and 2 tablets at night 03/19/17   Elveria Rising, NP  formoterol (PERFOROMIST) 20 MCG/2ML nebulizer solution Take 2 mLs (20 mcg total) by  nebulization 2 (two) times daily. 04/25/16   Kozlow, Alvira Philips, MD  hydrOXYzine (ATARAX/VISTARIL) 25 MG tablet Take 1 tablet (25 mg total) by mouth every 6 (six) hours. 03/29/17   Kirichenko, Tatyana, PA-C  hyoscyamine (LEVSIN SL) 0.125 MG SL tablet DISSOLVE 0.125mg  TABLET UNDER THE TONGUE 2 TIMES A DAY 08/08/14   [provider]  Levonorgestrel-Ethinyl Estradiol (SEASONIQUE) 0.15-0.03 &0.01 MG tablet Take 1 tablet by mouth daily.    [provider]  Melatonin 3 MG TABS Take 3 mg by mouth at bedtime. For insomnia    [provider]  montelukast (SINGULAIR) 10 MG tablet Take one tablet each evening to prevent cough or wheeze. Patient taking differently: Take 10 mg by mouth at bedtime.  02/25/17   Kozlow, Alvira Philips, MD  ondansetron (ZOFRAN) 4 MG tablet Take 1 tablet (4 mg total) by mouth every 6 (six) hours. 12/31/15   Tharon Aquas, PA  ranitidine (ZANTAC) 150 MG capsule Take 1 capsule (150 mg total) 2 (two) times daily by mouth. 01/24/17   Pyrtle, Carie Caddy, MD  senna (SENOKOT) 8.6 MG tablet Take 1 tablet by mouth daily.    [provider]  traZODone (DESYREL) 50 MG tablet Take 2 tablets (100 mg total) by mouth at bedtime. 11/07/16   Elveria Rising, NP    Family History Family History  Problem Relation Age of Onset  . Seizures Mother   . Seizures Sister        1 sister has  . Pancreatic cancer Sister   . Colon cancer Neg Hx   . Colon polyps Neg Hx   . Esophageal cancer Neg Hx   . Rectal cancer Neg Hx   . Stomach cancer Neg Hx     Social History Social History   Tobacco Use  . Smoking status: Never Smoker  . Smokeless tobacco: Never Used  Substance Use Topics  . Alcohol use: No    Alcohol/week: 0.0 oz  . Drug use: No     Allergies   Abilify [aripiprazole]; Seroquel [quetiapine fumarate]; Doxycycline; Amoxicillin-pot clavulanate; and Keppra [levetiracetam]   Review of Systems Review of Systems  Constitutional: Positive for fatigue.  Respiratory:  Positive for cough.   Neurological: Positive for seizures.  All other systems reviewed and are negative.    Physical Exam Updated Vital Signs BP 100/70   Pulse 88   Temp 98.8 F (37.1 C) (Oral)   Resp 18   Ht 4\' 9"  (1.448 m)   Wt 88.5 kg (195 lb)   SpO2 99%   BMI 42.20 kg/m    Physical Exam  Constitutional: She appears well-developed and well-nourished. No distress.  HENT:  Head: Normocephalic and atraumatic.  Right Ear: Hearing normal.  Left Ear: Hearing normal.  Nose: Nose normal.  Mouth/Throat: Oropharynx is clear and moist and mucous membranes are normal.  Eyes: Conjunctivae and EOM are normal. Pupils are  equal, round, and reactive to light.  Neck: Normal range of motion. Neck supple.  Cardiovascular: Regular rhythm, S1 normal and S2 normal. Exam reveals no gallop and no friction rub.  No murmur heard. Pulmonary/Chest: Effort normal and breath sounds normal. No respiratory distress. She exhibits no tenderness.  Abdominal: Soft. Normal appearance and bowel sounds are normal. There is no hepatosplenomegaly. There is no tenderness. There is no rebound, no guarding, no tenderness at McBurney's point and negative Murphy's sign. No hernia.  Musculoskeletal: Normal range of motion.  Neurological: She is alert. She has normal strength. No cranial nerve deficit or sensory deficit. Coordination normal. GCS eye subscore is 4. GCS verbal subscore is 5. GCS motor subscore is 6.  Skin: Skin is warm, dry and intact. No rash noted. No cyanosis.  Psychiatric: She has a normal mood and affect. Her speech is normal and behavior is normal. Thought content normal.  Nursing note and vitals reviewed.    ED Treatments / Results  Labs (all labs ordered are listed, but only abnormal results are displayed) Labs Reviewed  BASIC METABOLIC PANEL - Abnormal; Notable for the following components:      Result Value   CO2 20 (*)    Glucose, Bld 182 (*)    BUN 5 (*)    Calcium 8.6 (*)    All  other components within normal limits  CBC - Abnormal; Notable for the following components:   RBC 3.65 (*)    Hemoglobin 11.2 (*)    HCT 33.9 (*)    All other components within normal limits  CBG MONITORING, ED - Abnormal; Notable for the following components:   Glucose-Capillary 165 (*)    All other components within normal limits  VALPROIC ACID LEVEL  I-STAT BETA HCG BLOOD, ED ([facility], [facility], [facility] ONLY)    EKG  EKG Interpretation None       Radiology No results found.  Procedures Procedures (including critical care time)  Medications Ordered in ED Medications - No data to display   Initial Impression / Assessment and Plan / ED Course  I have reviewed the triage vital signs and the nursing notes.  Pertinent labs & imaging results that were available during my care of the patient were reviewed by me and considered in my medical decision making (see chart for details).     Patient brought to emergency department by mother because of concern over possible seizures.  Patient reportedly has a distant history of seizuresIt is unclear if she has had any recent seizures.  Mother has brought her to the emergency department numerous times for brief shaking episodes that were not felt to be seizures.  Patient has been seen by her neurologist as an outpatient and hadAn EEG which was normal.  Mother is very convinced that these are seizures and will not entertain the possibility that they are not.She insists that she needs to have a second opinion.  She reports that the patient has been seen at [facility] years ago.  I will recommend that she recontact [facility] for possible workup to rule out (or in) seizure.  Patient does not appear to be having generalized tonic-clonic or dangerous seizures.Her episodes are most likely mild clonus or other behavioral, but if they are seizures they are focal and therefore we will not make any medication changes at this time.  Final Clinical Impressions(s) / ED  Diagnoses   Final diagnoses:  Seizure-like activity [facility])    ED Discharge Orders    None  Gilda Crease, MD 04/01/17 0130

## 2017-04-02 ENCOUNTER — Encounter: Payer: Self-pay | Admitting: Family

## 2017-04-02 ENCOUNTER — Ambulatory Visit: Payer: Self-pay

## 2017-04-02 ENCOUNTER — Ambulatory Visit (INDEPENDENT_AMBULATORY_CARE_PROVIDER_SITE_OTHER): Payer: Medicare Other | Admitting: Family

## 2017-04-02 ENCOUNTER — Other Ambulatory Visit (INDEPENDENT_AMBULATORY_CARE_PROVIDER_SITE_OTHER): Payer: Medicare Other

## 2017-04-02 ENCOUNTER — Other Ambulatory Visit: Payer: Medicare Other

## 2017-04-02 ENCOUNTER — Telehealth (INDEPENDENT_AMBULATORY_CARE_PROVIDER_SITE_OTHER): Payer: Self-pay | Admitting: Family

## 2017-04-02 VITALS — BP 110/72 | HR 102 | Temp 98.2°F | Ht <= 58 in | Wt 198.0 lb

## 2017-04-02 DIAGNOSIS — Q159 Congenital malformation of eye, unspecified: Secondary | ICD-10-CM

## 2017-04-02 DIAGNOSIS — R8290 Unspecified abnormal findings in urine: Secondary | ICD-10-CM | POA: Diagnosis not present

## 2017-04-02 DIAGNOSIS — R7309 Other abnormal glucose: Secondary | ICD-10-CM

## 2017-04-02 DIAGNOSIS — R300 Dysuria: Secondary | ICD-10-CM

## 2017-04-02 DIAGNOSIS — R404 Transient alteration of awareness: Secondary | ICD-10-CM

## 2017-04-02 LAB — HEMOGLOBIN A1C: Hgb A1c MFr Bld: 7 % — ABNORMAL HIGH (ref 4.6–6.5)

## 2017-04-02 LAB — POC URINALSYSI DIPSTICK (AUTOMATED)
Bilirubin, UA: NEGATIVE
Glucose, UA: NEGATIVE
Ketones, UA: 5
Leukocytes, UA: NEGATIVE
NITRITE UA: NEGATIVE
PH UA: 8.5 — AB (ref 5.0–8.0)
Protein, UA: NEGATIVE
RBC UA: NEGATIVE
Spec Grav, UA: 1.015 (ref 1.010–1.025)
UROBILINOGEN UA: 1 U/dL

## 2017-04-02 NOTE — Telephone Encounter (Signed)
Mom contacted me to report that Menaal's right eye is swollen. She is fearful that she has had a stroke because the eye is swollen. She said that it occurred yesterday after Rosario went into deep sleep and possible seizure. Mom gave the phone to Woodrow's caregiver who said that it was swollen in the lower lid and eyelash area and looked like it might have some drainage. I talked to Mom and told her that Delecia needed to see her PCP or eye doctor to have the eye checked and that her description did not sound like a stroke. I also reminded Mom that it is very important for Mom to video Nolah's behaviors so that I can see what she is doing at home while awake and asleep. Mom agreed and will call me when she has video available. TG

## 2017-04-02 NOTE — Telephone Encounter (Signed)
Noted  

## 2017-04-02 NOTE — Progress Notes (Signed)
Lori Miles is a 22 y.o. female with the following history as recorded in EpicCare:  Patient Active Problem List   Diagnosis Date Noted  . Recurrent falls 03/21/2017  . Weakness 03/21/2017  . Venous insufficiency 03/21/2017  . Obstructive sleep apnea 09/30/2016  . Excessive daytime sleepiness 03/05/2016  . Acute bronchitis 05/05/2015  . Gait disorder 03/22/2015  . 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 Cough variant asthma vs UACS/ vcd  03/15/2015  . Autism spectrum disorder 02/14/2015  . Nausea & vomiting 02/14/2015  . Obesity, morbid (HCC) 12/13/2014  . 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 Acanthosis nigricans 11/14/2014  . Toeing-in 08/29/2014  . 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 Recurrent urinary tract infection 08/01/2011  . Asthma 08/01/2011  . Chronic constipation 08/01/2011  . Contraception 08/01/2011    Current Outpatient Medications  Medication Sig Dispense Refill  . acetaminophen (TYLENOL) 325 MG tablet Take 2 tablets (650 mg total) by mouth every 6 (six) hours as needed for mild pain or moderate pain. 60 tablet 0  . albuterol (PROVENTIL) (2.5 MG/3ML) 0.083% nebulizer solution Inhale the contents of one vial in nebulizer every four to six hours as needed for cough or wheeze. 120 mL 1  . budesonide (PULMICORT) 1 MG/2ML nebulizer solution Take 2 mLs (1 mg total) by nebulization 2 (two) times daily. 120 mL 5  . cetirizine (ZYRTEC) 10 MG tablet Take one tablet daily for runny nose or itching. 30 tablet 0  . cloNIDine (CATAPRES) 0.1 MG tablet take 1 and 1/2 tablet by mouth at bedtime (Patient taking differently: Take 0.15 mg by mouth at bedtime. ) 45 tablet 2  . Cranberry 500 MG CAPS Take 500 mg by mouth daily. Reported on 09/07/2015     . DEPAKOTE 500 MG DR tablet Give 1 tablet in the morning and 2 tablets at night (Patient taking differently: Take 500-1,000 mg by mouth See admin instructions. Give 1 tablet in the morning and 2 tablets at night) 90 tablet 5  . formoterol (PERFOROMIST) 20 MCG/2ML nebulizer solution Take 2 mLs (20 mcg total) by nebulization 2 (two) times daily. 120 mL 5  . hydrOXYzine (ATARAX/VISTARIL) 25 MG tablet Take 1 tablet (25 mg total) by mouth every 6 (six) hours. 20 tablet 0  . hyoscyamine (LEVSIN SL) 0.125 MG SL tablet DISSOLVE 0.125mg  TABLET UNDER THE TONGUE 2 TIMES A DAY  0  . Levonorgestrel-Ethinyl Estradiol (SEASONIQUE) 0.15-0.03 &0.01 MG tablet Take 1 tablet by mouth daily.    . Melatonin 3 MG TABS Take 3 mg by mouth at bedtime. For insomnia    . montelukast (SINGULAIR) 10 MG tablet Take one tablet each evening to prevent cough or wheeze. (Patient taking differently: Take 10 mg by mouth at bedtime. ) 30 tablet 2  . ondansetron (ZOFRAN) 4 MG tablet Take 1 tablet (4 mg total) by mouth every 6 (six) hours. 12 tablet 0  . ranitidine (ZANTAC) 150 MG capsule Take 1 capsule (150 mg total) 2 (two) times daily by mouth. 180 capsule 3  . senna (SENOKOT) 8.6 MG tablet Take 1 tablet by mouth daily.    . traZODone (DESYREL) 50 MG tablet Take 2 tablets (100 mg total) by mouth at bedtime. 62 tablet 5  . fluconazole (DIFLUCAN) 200 MG tablet   0   No current facility-administered medications  for this visit.     Allergies: Abilify [aripiprazole]; Seroquel [quetiapine fumarate]; Doxycycline; Amoxicillin-pot clavulanate; and Keppra [levetiracetam]  Past Medical History:  Diagnosis Date  . Allergy   . Asthma   . Autism    mom states no actual dx, but admits she has some symptoms  . Bronchitis   . Constipation   . Frequency-urgency syndrome     some incontinence ; wears pull-ups  . GERD (gastroesophageal reflux disease)   . Interstitial cystitis   . Mental deficiency    hx of aggressive behavior,impaired  speech   . Pneumonia   . PONV (postoperative nausea and vomiting)   . Seizure (HCC)    most recent sz " at the end of summer"-last sz years ago per mother  . Sleep apnea    needing to order CPAP    Past Surgical History:  Procedure Laterality Date  . CYSTOSCOPY    . CYSTOSCOPY WITH HYDRODISTENSION AND BIOPSY  04/14/2012   Procedure: CYSTOSCOPY/BIOPSY/HYDRODISTENSION;  Surgeon: Lindaann Slough, MD;  Location: [facility];  Service: Urology;;  instillation of marcaine and pyridium  . TONSILLECTOMY      Family History  Problem Relation Age of Onset  . Seizures Mother   . Seizures Sister        1 sister has  . Pancreatic cancer Sister   . Colon cancer Neg Hx   . Colon polyps Neg Hx   . Esophageal cancer Neg Hx   . Rectal cancer Neg Hx   . Stomach cancer Neg Hx     Social History   Tobacco Use  . Smoking status: Never Smoker  . Smokeless tobacco: Never Used  Substance Use Topics  . Alcohol use: No    Alcohol/week: 0.0 oz    Subjective:  Patient is brought back to the office by her mother with concerns for complications from another seizure; was in the ER again on 1/12 with concerns for seizure; mother notes that since this time, patient's right eye "looks swollen" especially right beneath her lower lid; mother wants to "make sure she didn't have a stroke in the eye." Has already spoken to neurologist who felt this was unlikely; has not scheduled appointment with eye doctor;  Mother is also asking that her daughter's urine be checked for an infection due to concerns regarding "strong odor." Patient does get UTIs and mother keeps antibiotic prescription to use if needed; she has not started daughter on antibiotics;   Objective:  Vitals:   04/02/17 1341  BP: 110/72  Pulse: (!) 102  Temp: 98.2 F (36.8 C)  TempSrc: Oral  SpO2: 95%  Weight: 198 lb 0.6 oz (89.8 kg)  Height: 4\' 9"  (1.448 m)    General: Well developed, well nourished, in no acute distress  Skin :  Warm and dry.  Head: Normocephalic and atraumatic  Eyes: Sclera and conjunctiva clear; pupils round and reactive to light; extraocular movements intact  Ears: External normal; canals clear; tympanic membranes normal  Oropharynx: Pink, supple. No suspicious lesions  Neck: Supple without thyromegaly, adenopathy  Lungs: Respirations unlabored; clear to auscultation bilaterally without wheeze, rales, rhonchi  Neurologic: Alert and oriented; in wheelchair; smiling, engaged, pleasant;   Assessment:  1. Elevated glucose   2. Urine abnormality   3. Dysuria   4. Eye anomaly     Plan:  1. Mother is questioning if her daughter is diabetic; notes she was told that her daughter's sugar was high when she went to ER but she  thought it was due to amount of steroids she has had to take recently; will check Hgba1c today; 2. & 3. U/A is not remarkable; patient appears dehydrated; check urine culture before starting another antibiotic; 4. Reassurance regarding eye; mother is encouraged to schedule a follow-up with patient's neurologist at [facility] and her eye doctor; mother agrees.   No Follow-up on file.  Orders Placed This Encounter  Procedures  . HgB A1c    Standing Status:   Future    Number of Occurrences:   1    Standing Expiration Date:   04/02/2018  . POCT Urinalysis Dipstick (Automated)    Requested Prescriptions    No prescriptions requested or ordered in this encounter

## 2017-04-02 NOTE — Telephone Encounter (Signed)
Patient has an appointment with laura today

## 2017-04-02 NOTE — Telephone Encounter (Signed)
I talked with Mom about this yesterday. See phone note from yesterday. TG

## 2017-04-02 NOTE — Telephone Encounter (Signed)
I agree with your advice.  I wonder if this was not a stye

## 2017-04-02 NOTE — Telephone Encounter (Signed)
[name] calling regarding right lower lid swelling. Pt not scratching it nor is there any discharge. [name] states she has been scratching a rash on her legs (which has been seen and tx at an ER visit 03/29/17). Pt to be seen today with Ria ClockLaura Murray NP at 1:20 pm.  Reason for Disposition . MODERATE-SEVERE eyelid swelling on one side  (Exception: due to a mosquito bite)  Answer Assessment - Initial Assessment Questions 1. ONSET: "When did the swelling start?" (e.g., minutes, hours, days)     Monday 2. LOCATION: "What part of the eyelids is swollen?"     Right lower lid 3. SEVERITY: "How swollen is it?"     Limited to the lower lid 4. ITCHING: "Is there any itching?" If so, ask: "How much?"   (Scale 1-10; mild, moderate or severe)     no 5. PAIN: "Is the swelling painful to touch?" If so, ask: "How painful is it?"   (Scale 1-10; mild, moderate or severe)     None noted 6. FEVER: "Do you have a fever?" If so, ask: "What is it, how was it measured, and when did it start?"      No fever 7. CAUSE: "What do you think is causing the swelling?"     Not sure what's causing the swelling 8. RECURRENT SYMPTOM: "Have you had eyelid swelling before?" If so, ask: "When was the last time?" "What happened that time?"     no 9. OTHER SYMPTOMS: "Do you have any other symptoms?" (e.g., blurred vision, eye discharge, rash, runny nose)     No discharge, rash on her legs (red dots- under tx) no runny nose 10. PREGNANCY: "Is there any chance you are pregnant?" "When was your last menstrual period?" On BCP  That prevents periods  Protocols used: [facility]

## 2017-04-02 NOTE — Telephone Encounter (Signed)
Copied from CRM [phone]. Topic: General - Other >> Apr 02, 2017  8:32 AM Lelon FrohlichGolden, Milo Solana, RMA wrote: Reason for CRM: pt mom called and would like a call back

## 2017-04-03 ENCOUNTER — Encounter: Payer: Self-pay | Admitting: Internal Medicine

## 2017-04-03 ENCOUNTER — Ambulatory Visit (INDEPENDENT_AMBULATORY_CARE_PROVIDER_SITE_OTHER): Payer: Medicare Other | Admitting: Internal Medicine

## 2017-04-03 DIAGNOSIS — J310 Chronic rhinitis: Secondary | ICD-10-CM | POA: Insufficient documentation

## 2017-04-03 DIAGNOSIS — J301 Allergic rhinitis due to pollen: Secondary | ICD-10-CM | POA: Diagnosis not present

## 2017-04-03 MED ORDER — OLOPATADINE HCL 0.1 % OP SOLN: 1.0000 [drp] | Freq: Two times a day (BID) | OPHTHALMIC | 0 refills | Status: DC | PRN

## 2017-04-03 NOTE — Progress Notes (Signed)
   Subjective:    Patient ID: Lori Miles, female    DOB: [birth year], 22 y.o.   MRN: [id number]  HPI The patient is a 22 YO female coming in for eye problem. They were seen yesterday afternoon for eye problem and they were told it was benign and they were advised to follow up with their eye doctor. The mother felt that it has changed and that there is drainage now from both eyes. Denies sick contacts. She is not able to answer questions and is not able to tell us if she is having any double, blurred vision. She is not able to tell us if she can see normally. The drainage was clear this morning and her mother wiped it off with a warm washcloth. She is still taking singulair and zyrtec. Denies fevers or chills. Still having problems with shaking that her mother is convinced is seizures. They are getting a home 24 hour EEG from neurology soon. She was not running into things walking in the last 24 hours.   Review of Systems  Unable to perform ROS: Patient nonverbal  HENT: Negative.   Eyes: Positive for discharge. Negative for photophobia, pain, redness, itching and visual disturbance.  Respiratory: Negative for cough, chest tightness and shortness of breath.   Cardiovascular: Negative for leg swelling.  Gastrointestinal: Negative for vomiting.      Objective:   Physical Exam  Constitutional: She appears well-developed and well-nourished.  Overweight  HENT:  Head: Normocephalic and atraumatic.  Right Ear: External ear normal.  Left Ear: External ear normal.  Eyes: Conjunctivae and EOM are normal. Pupils are equal, round, and reactive to light.  Mild clear drainage from the eyes, likely allergic, no changes to conjunctivae. She is not cooperative with visual field testing  Cardiovascular: Normal rate and regular rhythm.  Pulmonary/Chest: Effort normal.  Abdominal: Soft.  Neurological: She is alert.   Vitals:   04/03/17 0839  BP: 106/60  Pulse: (!) 106  Temp: 98.2 F (36.8 C)    TempSrc: Oral  SpO2: 98%  Weight: 198 lb (89.8 kg)  Height: 4\' 9"  (1.448 m)      Assessment & Plan:

## 2017-04-03 NOTE — Assessment & Plan Note (Signed)
Rx for patanol eye drops for the likely allergic drainage. Can use warm wash cloth for any drainage. No indication for antibiotics or steroids today. Explained to her mother that this is not a serious condition. Have recommended vision testing with an eye specialist still to ensure no visual changes.

## 2017-04-03 NOTE — Patient Instructions (Signed)
This is allergies in the eyes. Keep taking the zyrtec and singulair. It is okay to use benadryl for any itching in the eyes.   You can use a warm washcloth for the drainage.

## 2017-04-04 ENCOUNTER — Telehealth: Payer: Self-pay | Admitting: Internal Medicine

## 2017-04-05 LAB — URINE CULTURE
MICRO NUMBER: 90066406
SPECIMEN QUALITY: ADEQUATE

## 2017-04-07 NOTE — Telephone Encounter (Signed)
Unable to reach.

## 2017-04-08 ENCOUNTER — Other Ambulatory Visit: Payer: Self-pay | Admitting: Family

## 2017-04-08 DIAGNOSIS — Z87440 Personal history of urinary (tract) infections: Secondary | ICD-10-CM

## 2017-04-08 MED ORDER — CEFUROXIME AXETIL 500 MG PO TABS: 500.0000 mg | ORAL_TABLET | Freq: Two times a day (BID) | ORAL | 0 refills | Status: DC

## 2017-04-08 NOTE — Telephone Encounter (Signed)
I called Mom and scheduled an appointment for her to bring in the videos on Thurs Jan 24th at 1:30pm. TG

## 2017-04-08 NOTE — Telephone Encounter (Signed)
Mom Hulen Shoutsquanetta Guillotte contacted me to let me know that she has video of Lori Miles behaviors and that she can bring the video for me to see on Thurs Jan 24th. TG

## 2017-04-08 NOTE — Telephone Encounter (Signed)
At present I have nothing scheduled for January 24 at 1:30 PM

## 2017-04-08 NOTE — Progress Notes (Signed)
Patient's mother is aware of results; has follow-up with her PCP to review and discuss further.

## 2017-04-09 ENCOUNTER — Telehealth: Payer: Self-pay | Admitting: Family

## 2017-04-09 ENCOUNTER — Other Ambulatory Visit: Payer: Self-pay

## 2017-04-09 ENCOUNTER — Encounter: Payer: Self-pay | Admitting: Internal Medicine

## 2017-04-09 ENCOUNTER — Other Ambulatory Visit: Payer: Self-pay | Admitting: Family

## 2017-04-09 ENCOUNTER — Ambulatory Visit: Payer: Medicare Other | Admitting: Family

## 2017-04-09 ENCOUNTER — Telehealth: Payer: Self-pay

## 2017-04-09 MED ORDER — FOSFOMYCIN TROMETHAMINE 3 G PO PACK: 3.0000 g | PACK | Freq: Once | ORAL | 0 refills | Status: AC

## 2017-04-09 NOTE — Telephone Encounter (Signed)
Copied from CRM [phone]. Topic: Inquiry >> Apr 09, 2017 10:04 AM Everardo PacificMoton, Kelly, NT wrote: Reason for CRM: Patient mother  called and would like to speak with Ria ClockLaura Murray about her daughter medications (She doesn't know the name of meds) Stated that she was speaking with her and call was disconnected. If she could give her a call back at [phone]

## 2017-04-09 NOTE — Telephone Encounter (Signed)
Additional CRM: Patient called back, had not heard anything and was calling back .

## 2017-04-09 NOTE — Progress Notes (Signed)
I talked with patient's PCP about my concerns for UTI/ limited treatment options due to patient's allergies and numerous resistant strains; discussed my concern about giving Rocephin with patient's Augmentin allergy ( hives) even though mother states patient has had Keflex in the past; she is in agreement that mother may not be the best historian for patient's past health needs; reviewed culture again and will treat with Fosfomycin since Tobramycin is an option to treat. I contacted patient's mother and recommended that we cancel Rocephin injection and treat with Fosfomycin instead; Rx has been called to pharmacy; mother in agreement and appointment for today will be cancelled; she will drop off sample next week for repeat culture to ensure resolution of UTI.

## 2017-04-09 NOTE — Telephone Encounter (Signed)
Spoke with Mrs. Offner today to let her know order had been faxed and she would be contacted by [facility] regarding apt date and time. [name] voiced understanding and said she would contact [facility] if she had not heard from them tomorrow regarding appointment.

## 2017-04-10 NOTE — Telephone Encounter (Signed)
Disregard, allergy list has been faxed

## 2017-04-10 NOTE — Telephone Encounter (Signed)
Called [facility] to advise that I would be faxing copy over to them---fax #[phone]---Zandra's mother advised, copy of allergies have been faxed

## 2017-04-10 NOTE — Telephone Encounter (Signed)
Mom calling back because [facility] does not have the med list of what pt is allergic to. Mom states pt is allergic to many things, that is why pt was not treated in the office for her UTI. Mom states [facility] is waiting for this information,  And they cannot and/or will not treat her until they get this.  Mom states needs to be faxed to [facility] Attn: Kim Dr Mena GoesEskridge    [facility] has called her 3 times to get this info  Mom states urgent She is on the way to [facility] and hopes she can get a call back when done.

## 2017-04-10 NOTE — Telephone Encounter (Addendum)
Mom Hulen Shoutsquanetta Portugal came in today to show videos of Shaundra's behaviors. The videos were difficult to see but showed mild jerking of Tiana's right shoulder, and her hand in some of the videos. Mom said that if [facility] was jerking and had to stand that she would shake harder, need assistance to walk and nearly fall. Mom felt that with the jerking Shristi became less alert. Dr Sharene SkeansHickling came in to view the videos and advised Mom to get smelling salts to wave under her nose during these episodes, and try to video when this occurs. 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 Mom was fairly insistent that all behaviors were seizures but agreed to do as instructed. We may need to consider 2nd opinion if Mom continues to be concerned. TG

## 2017-04-11 ENCOUNTER — Telehealth: Payer: Self-pay | Admitting: Family

## 2017-04-11 ENCOUNTER — Telehealth: Payer: Self-pay | Admitting: Internal Medicine

## 2017-04-11 MED ORDER — AMMONIA AROMATIC IN INHA: 1.0000 | Freq: Once | RESPIRATORY_TRACT | 0 refills | Status: AC

## 2017-04-11 NOTE — Telephone Encounter (Signed)
Spoke with pts mother and let her know Dr. Lauro FranklinPyrtle's recommendations. She continued to ask about stool being backed up and pt not being able to pass the stool. Discussed with her that if pt has an impaction she would have to go to the ER to have this addressed also.

## 2017-04-11 NOTE — Telephone Encounter (Signed)
Copied from CRM [id number]. Topic: General - Other >> Apr 11, 2017  8:00 AM Crist InfanteHarrald, Kathy J wrote: Reason for CRM: mom called to let dr know [facility] gave pt the med Cefpodoxime 100 mg /8 pills for 3 days Also, mom states the dad is coming over to get the cups this am. Mom prefers to bring the urine back to the office and not [facility] to have the UA done.  Pt's nose started started running and has some blood mixed in. Mom thinks pt may have a sinus infection. Not sure if the dr wants to give pt an abx for that right now while  she is on this other med. Mom states if this does not work they will have to IV.

## 2017-04-11 NOTE — Telephone Encounter (Signed)
Patient's mom is calling back, she really wants a call from the nurse. Please advise.

## 2017-04-11 NOTE — Telephone Encounter (Signed)
In reviewing notes, mother called back and did decide for treatment from her urology; will not pursue [facility] at this time;

## 2017-04-11 NOTE — Telephone Encounter (Signed)
If she is having issues with constipation I would recommend she begin MiraLAX 17 g on a daily basis Ultrasound would not show constipation or bowel obstruction If she is having nausea, vomiting, inability to eat then she should go to urgent care to rule out obstruction however more likely this is constipation which can be treated with MiraLAX as above Call if ineffective

## 2017-04-11 NOTE — Addendum Note (Signed)
Addended by: Princella IonGOODPASTURE, Laron Boorman P on: 04/11/2017 05:11 PM   Modules accepted: Orders

## 2017-04-11 NOTE — Telephone Encounter (Signed)
Lori Miles, [name], Lori Miles  Lori Miles, Lori Miles Lori Woodruff, FNP        Cleone SlimHi Vernona RiegerLaura. We received the culture results on Lekia Stettler. First off, I wouldn't treat it if Shelanda was asymptomatic (no fever, bladder pain, dysuria). Nonetheless, we called [name]'s mom to bring her in for I&O cath UA/cx and to get IM Natasha BenceGent, but she said she can't bring Nivea to the office. She said to just "call something in". I think your plan for Monurol is a good one or you can refer her to ID. Also, we told Mom to take her to ED if high fever. She said she had a "102" fever last week but no fever this week. Let me know if we can be of any other assistance.  Thanks,  [employer]Matt Eskridge  [facility]

## 2017-04-11 NOTE — Telephone Encounter (Signed)
Pts mother calling wanting to know if he thinks it is time for pt to have an ultrasound. States she has been having some issues with constipation and she wonders if stool may be backed up in there causing an obstruction. States she has had US's done in the past and wondered if it was time to have one done. Please advise.

## 2017-04-14 ENCOUNTER — Other Ambulatory Visit: Payer: Self-pay | Admitting: Family

## 2017-04-14 ENCOUNTER — Other Ambulatory Visit: Payer: Medicare Other

## 2017-04-14 ENCOUNTER — Telehealth: Payer: Self-pay | Admitting: Internal Medicine

## 2017-04-14 DIAGNOSIS — Z87440 Personal history of urinary (tract) infections: Secondary | ICD-10-CM

## 2017-04-14 NOTE — Telephone Encounter (Signed)
Copied from CRM [id number]. Topic: Inquiry >> Apr 14, 2017 10:34 AM Alexander BergeronBarksdale, Harvey B wrote: Reason for CRM: pt called to notify Dr. Okey Duprerawford and NP Dayton ScrapeMurray that she is coming to the office to drop off a urine sample

## 2017-04-14 NOTE — Telephone Encounter (Signed)
Rhyanna Edell's mother phoned in to request a refill on ondansetron 4mg  Take one tab by mouth Q6h. She stated [name] takes this med 3 times a day for nausea with her meals. She reports this was discussed at Kimaria's last OV on 04/03/17 with Dr. Okey Duprerawford. She stated the last refill came from her previous provider.  Preferred pharmacy is [facility] on [address] and is noted on chart as pharmacy of choice.

## 2017-04-14 NOTE — Telephone Encounter (Signed)
Patient's mother called to [phone] to ask about why the patient needs a refill on Zofran, unable to leave message due to mailbox full. This med last filled 12/2015.

## 2017-04-14 NOTE — Telephone Encounter (Signed)
Copied from CRM [id number]. Topic: Quick Communication - Rx Refill/Question >> Apr 14, 2017  3:30 PM Floria RavelingStovall, Shana A wrote: Medication: ondansetron (ZOFRAN) 4 MG tablet [[id number]][[id number]]   Has the patient contacted their pharmacy? no  (Agent: If no, request that the patient contact the pharmacy for the refill.)   Preferred Pharmacy (with phone number or street name): [facility] on file    Agent: Please be advised that RX refills may take up to 3 business days. We ask that you follow-up with your pharmacy.

## 2017-04-15 ENCOUNTER — Institutional Professional Consult (permissible substitution): Payer: Medicare Other | Admitting: Pulmonary Disease

## 2017-04-15 ENCOUNTER — Telehealth: Payer: Self-pay | Admitting: Internal Medicine

## 2017-04-15 ENCOUNTER — Telehealth (INDEPENDENT_AMBULATORY_CARE_PROVIDER_SITE_OTHER): Payer: Self-pay | Admitting: Family

## 2017-04-15 ENCOUNTER — Other Ambulatory Visit: Payer: Self-pay | Admitting: Internal Medicine

## 2017-04-15 DIAGNOSIS — G40309 Generalized idiopathic epilepsy and epileptic syndromes, not intractable, without status epilepticus: Secondary | ICD-10-CM

## 2017-04-15 LAB — URINE CULTURE
MICRO NUMBER:: 90115762
Result:: NO GROWTH
SPECIMEN QUALITY:: ADEQUATE

## 2017-04-15 MED ORDER — ONDANSETRON HCL 4 MG PO TABS: 4.0000 mg | ORAL_TABLET | Freq: Two times a day (BID) | ORAL | 0 refills | Status: DC

## 2017-04-15 NOTE — Telephone Encounter (Signed)
Patient's mother is requesting a refill on zofran. Please advise.

## 2017-04-15 NOTE — Telephone Encounter (Signed)
Will call in 1 month and can discuss at next visit. Prior visits have focused on acute breathing concerns more than chronic problems.

## 2017-04-15 NOTE — Telephone Encounter (Signed)
Patient's mother states that patient take zofran odt 4 mg twice daily for nausea/vomiting associated with multiple medical problems. She states patient has seizures which she takes medications that cause her to have nausea/vomiting and she also has GERD, IC etc that contribute to her nausea/vomiting. Mother states that patient was getting the zofran from PCP but they have since stopped seeing that PCP and Dr Rhea BeltonPyrtle told them he would refill for them. Mother has been giving patient her zofran to substitute for patient's while she has been out of hers. Dr Rhea BeltonPyrtle please advise..... (of note, it appears Ms. Kotula is scheduled to see Dr Okey Duprerawford 05/2017).

## 2017-04-15 NOTE — Telephone Encounter (Signed)
Copied from CRM [id number]. Topic: General - Other >> Apr 15, 2017  8:26 AM Debroah LoopLander, Lumin L wrote: Reason for CRM: Mother, Artelia Larochequinetta would like a call back from Dr.  Lawana Chambersrawfords cma or other do discuss her daughters medications. She was asking about getting her an IV for her recent infection.

## 2017-04-15 NOTE — Telephone Encounter (Signed)
Notified pt mom w/MD response. Mom states daughter takes the Ondansetron twice a day as needed. She [facility]key Duprerawford. Due to her acid reflux and asthma sxs sometimes she gags and then start vomiting. This meds was rx by her previous pcp to help, and which it does. Mom is requesting rx to be refilled because she is out. She also states she see Dr. Rhea Miles but he has been out of the office.Marland Kitchen.Raechel Chute/lmb

## 2017-04-15 NOTE — Telephone Encounter (Signed)
[name] called to report that she did not purchase the smelling salts because 2 pharmacists told her that when people jerk their head away from the smell that they can hurt their neck. She also wanted to let me know that the prolonged video EEG had been put in place today. TG

## 2017-04-15 NOTE — Telephone Encounter (Signed)
Patient mom states that she is returning phone call. Best [phone]

## 2017-04-15 NOTE — Telephone Encounter (Signed)
Notified pt w/MD response.../lmb 

## 2017-04-15 NOTE — Telephone Encounter (Signed)
See telephone note from yesterday from Dr Okey Duprerawford (I missed this earlier).

## 2017-04-15 NOTE — Telephone Encounter (Signed)
Tried calling patient back in regard to this message mailbox is full, if this is in regard to recent infection that she saw laura for message will need to be routed to her

## 2017-04-15 NOTE — Telephone Encounter (Signed)
Dr Okey Duprerawford has given a 1 month rx. I advised that Dr Frutoso Chaserawford's note indicates she will discuss this further with her at office visit next month. Mother states that she was told that Dr Okey Duprerawford will fill it 1 month, then Dr Rhea BeltonPyrtle will fill it from then on.

## 2017-04-15 NOTE — Telephone Encounter (Signed)
We have not talked about her taking this 3 times a day with meals. She should not be taking this long term. Just only when needed for nausea. If she is having nausea long term we need to investigate this.

## 2017-04-15 NOTE — Telephone Encounter (Signed)
Refused, patient should not be taking medication with every meal if patient is having nausea that bad patient needs an appointment to discuss

## 2017-04-15 NOTE — Telephone Encounter (Signed)
Ok for zofran odt 4 mg BIDPRN

## 2017-04-16 ENCOUNTER — Telehealth: Payer: Self-pay | Admitting: Internal Medicine

## 2017-04-16 DIAGNOSIS — G40309 Generalized idiopathic epilepsy and epileptic syndromes, not intractable, without status epilepticus: Secondary | ICD-10-CM | POA: Diagnosis not present

## 2017-04-16 NOTE — Telephone Encounter (Signed)
Copied from CRM [id number]. Topic: Quick Communication - See Telephone Encounter >> Apr 16, 2017  8:22 AM Eston Mouldavis, Malini Flemings B wrote: CRM for notification. See Telephone encounter for:  Pts mother is asking if the pts urine was cultured and if so  she is asking for results  04/16/17.

## 2017-04-16 NOTE — Telephone Encounter (Signed)
Contacted mother taken care of in result notes

## 2017-04-16 NOTE — Progress Notes (Signed)
LM for mom to call back to schedule

## 2017-04-16 NOTE — Telephone Encounter (Signed)
Lori Miles, Looks like Dr. Okey Duprerawford refilled the Zofran yesterday

## 2017-04-16 NOTE — Telephone Encounter (Signed)
Pts mother just wanted to let us know that Dr. Okey Duprerawford filled the zofran yesterday but future refills Dr. Rhea BeltonPyrtle will take care of. Note in the chart from yesterday.

## 2017-04-17 ENCOUNTER — Telehealth: Payer: Self-pay | Admitting: Pulmonary Disease

## 2017-04-17 ENCOUNTER — Encounter: Payer: Self-pay | Admitting: Family Medicine

## 2017-04-17 ENCOUNTER — Ambulatory Visit (INDEPENDENT_AMBULATORY_CARE_PROVIDER_SITE_OTHER): Payer: Medicare Other | Admitting: Family Medicine

## 2017-04-17 ENCOUNTER — Ambulatory Visit (INDEPENDENT_AMBULATORY_CARE_PROVIDER_SITE_OTHER)
Admission: RE | Admit: 2017-04-17 | Discharge: 2017-04-17 | Disposition: A | Discharge status: Home / Self Care | Payer: Medicare Other | Source: Ambulatory Visit | Attending: Family Medicine | Admitting: Family Medicine

## 2017-04-17 ENCOUNTER — Telehealth (INDEPENDENT_AMBULATORY_CARE_PROVIDER_SITE_OTHER): Payer: Self-pay | Admitting: Pediatrics

## 2017-04-17 ENCOUNTER — Other Ambulatory Visit (INDEPENDENT_AMBULATORY_CARE_PROVIDER_SITE_OTHER): Payer: Medicare Other

## 2017-04-17 ENCOUNTER — Ambulatory Visit: Payer: Self-pay | Admitting: *Deleted

## 2017-04-17 ENCOUNTER — Telehealth: Payer: Self-pay | Admitting: Internal Medicine

## 2017-04-17 VITALS — BP 134/66 | HR 106 | Temp 99.0°F | Ht <= 58 in | Wt 191.0 lb

## 2017-04-17 DIAGNOSIS — R509 Fever, unspecified: Secondary | ICD-10-CM

## 2017-04-17 DIAGNOSIS — Z87440 Personal history of urinary (tract) infections: Secondary | ICD-10-CM

## 2017-04-17 LAB — URINALYSIS, ROUTINE W REFLEX MICROSCOPIC
Ketones, ur: 15 — AB
NITRITE: NEGATIVE
PH: 6.5 (ref 5.0–8.0)
SPECIFIC GRAVITY, URINE: 1.025 (ref 1.000–1.030)
Urine Glucose: NEGATIVE
Urobilinogen, UA: 1 (ref 0.0–1.0)

## 2017-04-17 LAB — CBC WITH DIFFERENTIAL/PLATELET
Basophils Absolute: 0.1 10*3/uL (ref 0.0–0.1)
Basophils Relative: 0.6 % (ref 0.0–3.0)
EOS PCT: 0.2 % (ref 0.0–5.0)
Eosinophils Absolute: 0 10*3/uL (ref 0.0–0.7)
HCT: 35.5 % — ABNORMAL LOW (ref 36.0–46.0)
Hemoglobin: 11.8 g/dL — ABNORMAL LOW (ref 12.0–15.0)
LYMPHS ABS: 3.4 10*3/uL (ref 0.7–4.0)
Lymphocytes Relative: 34.2 % (ref 12.0–46.0)
MCHC: 33.1 g/dL (ref 30.0–36.0)
MCV: 92.3 fl (ref 78.0–100.0)
MONO ABS: 1.1 10*3/uL — AB (ref 0.1–1.0)
MONOS PCT: 10.6 % (ref 3.0–12.0)
NEUTROS ABS: 5.5 10*3/uL (ref 1.4–7.7)
NEUTROS PCT: 54.4 % (ref 43.0–77.0)
PLATELETS: 258 10*3/uL (ref 150.0–400.0)
RBC: 3.85 Mil/uL — ABNORMAL LOW (ref 3.87–5.11)
RDW: 14.7 % (ref 11.5–15.5)
WBC: 10 10*3/uL (ref 4.0–10.5)

## 2017-04-17 NOTE — Assessment & Plan Note (Signed)
History obtained from mother. She has tachycardia but this seems to be persistent for her. Has a history of repeated UTI's and incontinence. Has repeated yeast infections and takes diflucan as needed. Having some throat pain. Possible for flu.  - chest xray - urinalysis and urine culture  - CBC w/ diff - counseled on supportive care  - given indications to follow up.

## 2017-04-17 NOTE — Patient Instructions (Signed)
We will call with the results from today  Continue the nasal saline  Please let us know if her symptoms change or they worse  Please try a warm wash cloth for her eyes. Four times a day for 20 minutes at a time.

## 2017-04-17 NOTE — Telephone Encounter (Signed)
Pt mother is calling back about the size of the mask the wears. She states it is on the paper from the sleep study. Cb is [phone].

## 2017-04-17 NOTE — Telephone Encounter (Signed)
Spoke with Mrs. Lori PikesSusan and the orders are for Occupational Therapy. She states that she is declining in all ADLs and she is having some falls. They will fax over the forms

## 2017-04-17 NOTE — Telephone Encounter (Signed)
OT is going to make 3 visits to show mom how to safely transfer [place].  I will sign the form.

## 2017-04-17 NOTE — Telephone Encounter (Signed)
She had sleep study-CPAP titration study  08/2016  Why did she not get CPAP after this  I am fine to order CPAP to get started -Trial of CPAP therapy on 10 cm H2O  But according to note initial sleep study 03/2016  did not show OSA so not sure if CPAP will be covered.   Most likely will need to be covered at ov as she may have another type of sleep disorder.

## 2017-04-17 NOTE — Telephone Encounter (Signed)
Patient's mom call to give her an appointment today with Daphene JaegerJ. Schmitz at 2:40 today. She voiced understanding.

## 2017-04-17 NOTE — Telephone Encounter (Signed)
Called and spoke with pts mother and she stated that the appt for her daughter has been pushed back 2 times and that she is now not going to be seen until the end of Feb.  Pts mother stated that her daughter is up all night and then sleeps off and on during the day.  She is wanting to know if TP or another doctor could get the cpap mask and machine ordered for the pt?  TP please advise.

## 2017-04-17 NOTE — Progress Notes (Addendum)
Lori Miles - 22 y.o. female MRN [id number]  Date of birth: 03-11-1996  SUBJECTIVE:  Including CC & ROS.  Chief Complaint  Patient presents with  . Emesis    Lori Miles is a 22 y.o. female that is presenting with vomiting and bloody nose. Ongoing for one week has been getting worse. Has not been able to tolerate food or liquids. Admits to chills and body aches. Patient is incontinent and wears diapers. Has a history of UTI's. She had som sputum production that the PT was concerned about today. Had a recent fever of 101.8. She recently finished taking levaquin. She is having an eye discharge but no red eyes.   Urine culture from 1/16 grew proteus   Review of chest xray from 1/12 shows no active disease   History provided by mother.    Review of Systems  Constitutional: Positive for fever.  HENT: Positive for sore throat.   Eyes: Positive for discharge.  Respiratory: Negative for cough.   Cardiovascular: Negative for chest pain.  Gastrointestinal: Negative for abdominal pain.  Genitourinary: Negative for vaginal discharge.  Musculoskeletal: Negative for back pain.  Skin: Negative for color change.  Neurological: Negative for weakness.  Hematological: Negative for adenopathy.  Psychiatric/Behavioral: Negative for agitation.    HISTORY: Past Medical, Surgical, Social, and Family History Reviewed & Updated per EMR.   Pertinent Historical Findings include:  Past Medical History:  Diagnosis Date  . Allergy   . Asthma   . Autism    mom states no actual dx, but admits she has some symptoms  . Bronchitis   . Constipation   . Frequency-urgency syndrome     some incontinence ; wears pull-ups  . GERD (gastroesophageal reflux disease)   . Interstitial cystitis   . Mental deficiency    hx of aggressive behavior,impaired speech   . Pneumonia   . PONV (postoperative nausea and vomiting)   . Seizure (HCC)    most recent sz " at the end of summer"-last sz years ago per  mother  . Sleep apnea    needing to order CPAP    Past Surgical History:  Procedure Laterality Date  . CYSTOSCOPY    . CYSTOSCOPY WITH HYDRODISTENSION AND BIOPSY  04/14/2012   Procedure: CYSTOSCOPY/BIOPSY/HYDRODISTENSION;  Surgeon: Lindaann Slough, MD;  Location: [facility];  Service: Urology;;  instillation of marcaine and pyridium  . TONSILLECTOMY      Allergies  Allergen Reactions  . Abilify [Aripiprazole] Swelling and Palpitations  . Seroquel [Quetiapine Fumarate] Palpitations  . Doxycycline Other (See Comments)    Stomach cramps  . Amoxicillin-Pot Clavulanate Hives  . Keppra [Levetiracetam] Other (See Comments)    Aggressive behavior     Family History  Problem Relation Age of Onset  . Seizures Mother   . Seizures Sister        1 sister has  . Pancreatic cancer Sister   . Colon cancer Neg Hx   . Colon polyps Neg Hx   . Esophageal cancer Neg Hx   . Rectal cancer Neg Hx   . Stomach cancer Neg Hx      Social History   Socioeconomic History  . Marital status: Single    Spouse name: Not on file  . Number of children: Not on file  . Years of education: Not on file  . Highest education level: Not on file  Social Needs  . Financial resource strain: Not on file  . Food insecurity - worry: Not on file  .  Food insecurity - inability: Not on file  . Transportation needs - medical: Not on file  . Transportation needs - non-medical: Not on file  Occupational History  . Not on file  Tobacco Use  . Smoking status: Never Smoker  . Smokeless tobacco: Never Used  Substance and Sexual Activity  . Alcohol use: No    Alcohol/week: 0.0 oz  . Drug use: No  . Sexual activity: No    Birth control/protection: Pill  Other Topics Concern  . Not on file  Social History Narrative   Lives with mom (Lori Miles) and half-sister Lori Miles(Lori Miles) who is also mentally impaired.  Has CAP assistance, urinary incontinence, needs help with feeding,dressing,  toileting   Graduated from [employer].  She enjoys playing with cars, dancing, and dogs.     PHYSICAL EXAM:  VS: BP 134/66 (BP Location: Left Arm, Patient Position: Sitting, Cuff Size: Normal)   Pulse (!) 106   Temp 99 F (37.2 C) (Oral)   Ht 4\' 9"  (1.448 m)   Wt 191 lb (86.6 kg)   SpO2 96%   BMI 41.33 kg/m  Physical Exam Gen: NAD, alert, cooperative with exam, nonverbal ENT: normal lips, normal nasal mucosa, tympanic membranes clear and intact bilaterally, normal oropharynx,  Eye: normal EOM, normal conjunctiva and lids, discharge eye lashes  CV:  no edema, +2 pedal pulses, regular rhythm, S1-S2   Resp: no accessory muscle use, non-labored, clear to auscultation bilaterally, no crackles or wheezes GI: no masses or tenderness, no hernia  Skin: no rashes, no areas of induration  Neuro: normal tone, normal sensation to touch Psych:  normal insight, alert and oriented MSK: Normal gait, normal strength       ASSESSMENT & PLAN:   Fever History obtained from mother. She has tachycardia but this seems to be persistent for her. Has a history of repeated UTI's and incontinence. Has repeated yeast infections and takes diflucan as needed. Having some throat pain. Possible for flu.  - chest xray - urinalysis and urine culture  - CBC w/ diff - counseled on supportive care  - given indications to follow up.

## 2017-04-17 NOTE — Telephone Encounter (Signed)
Pt's mom called saying that Lori Miles has been vomiting since yesterday and now has some bleeding from her nose. She did not want to be triaged just make an appointment for her to be seen today.  Reaching out to flow for an appointment.

## 2017-04-17 NOTE — Telephone Encounter (Signed)
°  Who's calling (name and relationship to patient) : Beaulah CorinSusan Davis [facility]  Best contact number: [phone] Provider they see: Sharene SkeansHickling  Reason for call: Darl PikesSusan left a voice message stating she need verbal orders for patient.  They will sent the hardcopy for Dr Sharene SkeansHickling to sign.  Please call.     PRESCRIPTION REFILL ONLY  Name of prescription:  Pharmacy:

## 2017-04-18 ENCOUNTER — Telehealth: Payer: Self-pay | Admitting: Family Medicine

## 2017-04-18 DIAGNOSIS — F840 Autistic disorder: Secondary | ICD-10-CM

## 2017-04-18 DIAGNOSIS — G40309 Generalized idiopathic epilepsy and epileptic syndromes, not intractable, without status epilepticus: Secondary | ICD-10-CM

## 2017-04-18 DIAGNOSIS — F819 Developmental disorder of scholastic skills, unspecified: Secondary | ICD-10-CM

## 2017-04-18 MED ORDER — LEVOFLOXACIN 750 MG PO TABS: 750.0000 mg | ORAL_TABLET | Freq: Every day | ORAL | 0 refills | Status: DC

## 2017-04-18 NOTE — Telephone Encounter (Signed)
Called and spoke with pt's mother regarding CPAP machine set up and supplies  [name] requesting pt to receive cpap machine and supplies; pt doesn't have cpap in home at this time [facility] spoke with Vicente Males, she advised pt has no cpap machine with them as Animator where not met, that pt's mother has the option to pay out of pocket for cpap and supplies range from $1200- $1500. The script for cpap machine with settings and supplies did not come from [facility], came from [facility] Ludwig NP 10/02/16  Advised pt's mother that VS will see pt on 05/14/2017 for further evaluation Nothing further needed at this time

## 2017-04-18 NOTE — Telephone Encounter (Signed)
Called and spoke with Lori Miles, pt's mother Royann ShiversCB [phone] [name] [facility] She con't to interrupts while relying info from TP recommendations below. She hung up quick and said she would have to call back later  Tried to explain to pt's mother that we can order the CPAP machine at 10cm H2O per TP recommendations But advised that per initial sleep study in 04/04/2016 did not show OSA, not sure if insurance will pay for it Tried to let her know that VS will see the pt on 05/14/2017 and at that time determine if CPAP is needed or if there is another sleep disorder possible, but the pt's mother hung up the phone call

## 2017-04-18 NOTE — Telephone Encounter (Signed)
Spoke with patient's mother about her urine and chest xray. Will wait to see what urine culture shows. Chest xray was not definitive for infection. Will provided levaquin so they can have on hand if she feels her daughter is doing worse over the weekend. Discussed sending in a nurse with home health.   Schmitz, Jeremy E, MD [facility] 04/18/2017, 1:44 PM

## 2017-04-18 NOTE — Telephone Encounter (Signed)
[name] returning call. Upset about her daughter's need for a Cpap. Would like a nurse to call her back.  Cb is [phone]

## 2017-04-20 LAB — URINE CULTURE
MICRO NUMBER:: 90133691
SPECIMEN QUALITY:: ADEQUATE

## 2017-04-21 ENCOUNTER — Telehealth: Payer: Self-pay | Admitting: Internal Medicine

## 2017-04-21 ENCOUNTER — Encounter (HOSPITAL_COMMUNITY): Payer: Self-pay | Admitting: *Deleted

## 2017-04-21 ENCOUNTER — Emergency Department (HOSPITAL_COMMUNITY)
Admission: EM | Admit: 2017-04-21 | Discharge: 2017-04-22 | Disposition: A | Discharge status: Home / Self Care | Payer: Medicare Other | Attending: Emergency Medicine | Admitting: Emergency Medicine

## 2017-04-21 ENCOUNTER — Other Ambulatory Visit: Payer: Self-pay

## 2017-04-21 DIAGNOSIS — F840 Autistic disorder: Secondary | ICD-10-CM | POA: Diagnosis not present

## 2017-04-21 DIAGNOSIS — Z79899 Other long term (current) drug therapy: Secondary | ICD-10-CM | POA: Diagnosis not present

## 2017-04-21 DIAGNOSIS — N390 Urinary tract infection, site not specified: Secondary | ICD-10-CM | POA: Insufficient documentation

## 2017-04-21 DIAGNOSIS — R32 Unspecified urinary incontinence: Secondary | ICD-10-CM | POA: Diagnosis present

## 2017-04-21 DIAGNOSIS — J45909 Unspecified asthma, uncomplicated: Secondary | ICD-10-CM | POA: Insufficient documentation

## 2017-04-21 LAB — URINALYSIS, ROUTINE W REFLEX MICROSCOPIC
GLUCOSE, UA: 150 mg/dL — AB
Hgb urine dipstick: NEGATIVE
KETONES UR: 20 mg/dL — AB
Nitrite: NEGATIVE
PH: 5 (ref 5.0–8.0)
Protein, ur: 30 mg/dL — AB
Specific Gravity, Urine: 1.03 (ref 1.005–1.030)

## 2017-04-21 LAB — CBC WITH DIFFERENTIAL/PLATELET
BASOS ABS: 0.1 10*3/uL (ref 0.0–0.1)
Basophils Relative: 1 %
Eosinophils Absolute: 0 10*3/uL (ref 0.0–0.7)
Eosinophils Relative: 0 %
HEMATOCRIT: 35.5 % — AB (ref 36.0–46.0)
HEMOGLOBIN: 11.4 g/dL — AB (ref 12.0–15.0)
LYMPHS ABS: 4.5 10*3/uL — AB (ref 0.7–4.0)
LYMPHS PCT: 36 %
MCH: 29.8 pg (ref 26.0–34.0)
MCHC: 32.1 g/dL (ref 30.0–36.0)
MCV: 92.9 fL (ref 78.0–100.0)
Monocytes Absolute: 1.3 10*3/uL — ABNORMAL HIGH (ref 0.1–1.0)
Monocytes Relative: 10 %
NEUTROS ABS: 6.4 10*3/uL (ref 1.7–7.7)
Neutrophils Relative %: 53 %
Platelets: 240 10*3/uL (ref 150–400)
RBC: 3.82 MIL/uL — AB (ref 3.87–5.11)
RDW: 14.4 % (ref 11.5–15.5)
WBC: 12.3 10*3/uL — AB (ref 4.0–10.5)

## 2017-04-21 LAB — BASIC METABOLIC PANEL
Anion gap: 9 (ref 5–15)
BUN: 6 mg/dL (ref 6–20)
CHLORIDE: 109 mmol/L (ref 101–111)
CO2: 20 mmol/L — AB (ref 22–32)
Calcium: 8.7 mg/dL — ABNORMAL LOW (ref 8.9–10.3)
Creatinine, Ser: 0.73 mg/dL (ref 0.44–1.00)
GFR calc Af Amer: >60 mL/min (ref >60)
GLUCOSE: 89 mg/dL (ref 65–99)
POTASSIUM: 4.1 mmol/L (ref 3.5–5.1)
Sodium: 138 mmol/L (ref 135–145)

## 2017-04-21 LAB — I-STAT CG4 LACTIC ACID, ED: Lactic Acid, Venous: 1.93 mmol/L — ABNORMAL HIGH (ref 0.5–1.9)

## 2017-04-21 MED ORDER — SODIUM CHLORIDE 0.9 % IV BOLUS (SEPSIS)
1000.0000 mL | Freq: Once | INTRAVENOUS | Status: AC
  Administered 2017-04-22: 1000 mL via INTRAVENOUS

## 2017-04-21 MED ORDER — DEXTROSE 5 % IV SOLN
1.0000 g | Freq: Once | INTRAVENOUS | Status: AC
  Administered 2017-04-22: 1 g via INTRAVENOUS
  Filled 2017-04-21: qty 10

## 2017-04-21 NOTE — ED Notes (Signed)
Called and no answer.

## 2017-04-21 NOTE — Telephone Encounter (Addendum)
Mom following up on the request for a new med. Mom hoping for a call back asap concerning a new prescription  Rebeca had called mom this morning about the urine culture. Mom states Rebeca was to call her back. Mom states they will go to the ED for IV drugs if she needs to.  Mom states pt is wetting everywhere and has a foul smell.

## 2017-04-21 NOTE — Telephone Encounter (Signed)
Pt mother is calling to let Rebec[facility] know that [facility].

## 2017-04-21 NOTE — ED Triage Notes (Signed)
Pt Family was call by MD office to tell come to Hospital for IV antibx. . Parent reports Pt has fequent UTI's. Pt is antibx resistant.

## 2017-04-21 NOTE — ED Provider Notes (Signed)
Patient placed in Quick Look pathway, seen and evaluated   Chief Complaint: frequent uti   HPI:   22 year old female with frequent recurrent UTIs that presents to the emergency department today after request from PCP for "IV antibiotics per family".  Patient has history of frequent UTIs that are resistant to antibiotics.  Last urine culture that was obtained 4 days ago shows resistance to Macrobid, Levaquin, Bactrim.  Mother states that patient has had strong smelling urine and urinary incontinence.  Was seen by [facility] last week.  Patient with history of autism and unable to provide any history.  ROS: Reports urinary urgency, frequency, incontinence, strong smelling urine, fever, tachycardia, emesis.  (one)  Physical Exam:   Gen: No distress  Neuro: Awake and Alert  Skin: Warm    Focused Exam: Bilateral CVA tenderness.  Normal bowel sounds.  No focal abdominal tenderness to palpation.  Abdomen appears benign.  No distention noted.  Tachycardia is noted.   Initiation of care has begun. The patient has been counseled on the process, plan, and necessity for staying for the completion/evaluation, and the remainder of the medical screening examination    Wallace KellerLeaphart, Leontae Bostock T, PA-C 04/21/17 1723    Margarita Grizzleay, Danielle, MD 04/22/17 [phone]

## 2017-04-21 NOTE — Telephone Encounter (Signed)
Spoke with Patient's mother concerning antibiotic.[facility]. Declined offer to send in prescription.

## 2017-04-21 NOTE — ED Provider Notes (Signed)
[facility] EMERGENCY DEPARTMENT Provider Note   CSN: [id number] Arrival date & time: 04/21/17  1459     History   Chief Complaint Chief Complaint  Patient presents with  . Urinary Tract Infection    HPI Lori Miles is a 22 y.o. female history of recurrent UTIs, urinary incontinence, seizures, mental retardation is here with parents for recurrent UTI. Mom states that she has UTIs at least once a month, last one was one week ago. She began having her typical UTI symptoms including subjective fever, foul-smelling urine, darker urine, worsening urinary incontinence on Thursday. She has also had sinus congestion and rhinorrhea, h/o recurrent sinusitis as well. Was seen by her PCP for both UTI and sinus congestion last week. PCP obtained urine culture, patient's mother was called today regarding results. Mother is concerned that patient may need IV antibiotics for UTI.  HPI  Past Medical History:  Diagnosis Date  . Allergy   . Asthma   . Autism    mom states no actual dx, but admits she has some symptoms  . Bronchitis   . Constipation   . Frequency-urgency syndrome     some incontinence ; wears pull-ups  . GERD (gastroesophageal reflux disease)   . Interstitial cystitis   . Mental deficiency    hx of aggressive behavior,impaired speech   . Pneumonia   . PONV (postoperative nausea and vomiting)   . Seizure (HCC)    most recent sz " at the end of summer"-last sz years ago per mother  . Sleep apnea    needing to order CPAP    Patient Active Problem List   Diagnosis Date Noted  . Fever 04/17/2017  . Allergic rhinitis 04/03/2017  . Recurrent falls 03/21/2017  . Weakness 03/21/2017  . Venous insufficiency 03/21/2017  . Obstructive sleep apnea 09/30/2016  . Excessive daytime sleepiness 03/05/2016  . Gait disorder 03/22/2015  . 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 Cough variant asthma vs UACS/ vcd  03/15/2015  . Autism spectrum disorder 02/14/2015    . Nausea & vomiting 02/14/2015  . Obesity, morbid (HCC) 12/13/2014  . 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 Acanthosis nigricans 11/14/2014  . Toeing-in 08/29/2014  . 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 Recurrent urinary tract infection 08/01/2011  . Asthma 08/01/2011  . Chronic constipation 08/01/2011  . Contraception 08/01/2011    Past Surgical History:  Procedure Laterality Date  . CYSTOSCOPY    . CYSTOSCOPY WITH HYDRODISTENSION AND BIOPSY  04/14/2012   Procedure: CYSTOSCOPY/BIOPSY/HYDRODISTENSION;  Surgeon: Lindaann Slough, MD;  Location: [facility];  Service: Urology;;  instillation of marcaine and pyridium  . TONSILLECTOMY      OB History    No data available       Home Medications    Prior to Admission medications   Medication Sig Start Date End Date Taking? Authorizing Provider  acetaminophen (TYLENOL) 325 MG tablet Take 2 tablets (650 mg total) by mouth every 6 (six) hours as needed for mild pain or moderate pain. 02/27/17   Everlene Farrier, PA-C  albuterol (PROVENTIL) (2.5 MG/3ML) 0.083% nebulizer solution Inhale the contents of one vial in nebulizer every four to six hours as needed for cough or wheeze. 04/23/16   Kozlow, Alvira Philips, MD  amoxicillin-clavulanate (AUGMENTIN) 250-62.5 MG/5ML suspension Take 10  mLs (500 mg total) by mouth 2 (two) times daily for 5 days. 04/22/17 04/27/17  Liberty Handy, PA-C  budesonide (PULMICORT) 1 MG/2ML nebulizer solution Take 2 mLs (1 mg total) by nebulization 2 (two) times daily. 03/05/17   Kozlow, Alvira Philips, MD  cetirizine (ZYRTEC) 10 MG tablet Take one tablet daily for runny nose or itching. 01/27/17   Kozlow, Alvira Philips, MD  cloNIDine (CATAPRES) 0.1 MG tablet take 1 and 1/2 tablet by mouth at  bedtime Patient taking differently: Take 0.15 mg by mouth at bedtime.  02/12/17   Elveria Rising, NP  Cranberry 500 MG CAPS Take 500 mg by mouth daily. Reported on 09/07/2015    [provider]  DEPAKOTE 500 MG DR tablet Give 1 tablet in the morning and 2 tablets at night Patient taking differently: Take 500-1,000 mg by mouth See admin instructions. Give 1 tablet in the morning and 2 tablets at night 03/19/17   Elveria Rising, NP  fluconazole (DIFLUCAN) 200 MG tablet  03/31/17   [provider]  formoterol (PERFOROMIST) 20 MCG/2ML nebulizer solution Take 2 mLs (20 mcg total) by nebulization 2 (two) times daily. 04/25/16   Kozlow, Alvira Philips, MD  hydrOXYzine (ATARAX/VISTARIL) 25 MG tablet Take 1 tablet (25 mg total) by mouth every 6 (six) hours. 03/29/17   Kirichenko, Tatyana, PA-C  hyoscyamine (LEVSIN SL) 0.125 MG SL tablet DISSOLVE 0.125mg  TABLET UNDER THE TONGUE 2 TIMES A DAY 08/08/14   [provider]  levofloxacin (LEVAQUIN) 750 MG tablet Take 1 tablet (750 mg total) by mouth daily. 04/18/17   Myra Rude, MD  Levonorgestrel-Ethinyl Estradiol (SEASONIQUE) 0.15-0.03 &0.01 MG tablet Take 1 tablet by mouth daily.    [provider]  Melatonin 3 MG TABS Take 3 mg by mouth at bedtime. For insomnia    [provider]  montelukast (SINGULAIR) 10 MG tablet Take one tablet each evening to prevent cough or wheeze. Patient taking differently: Take 10 mg by mouth at bedtime.  02/25/17   Kozlow, Alvira Philips, MD  olopatadine (PATANOL) 0.1 % ophthalmic solution Place 1 drop into both eyes 2 (two) times daily as needed for allergies. 04/03/17   Myrlene Broker, MD  ondansetron (ZOFRAN) 4 MG tablet Take 1 tablet (4 mg total) by mouth 2 (two) times daily. 04/15/17   Myrlene Broker, MD  ranitidine (ZANTAC) 150 MG capsule Take 1 capsule (150 mg total) 2 (two) times daily by mouth. 01/24/17   Pyrtle, Carie Caddy, MD  senna (SENOKOT) 8.6 MG tablet Take 1 tablet by mouth  daily.    [provider]  traZODone (DESYREL) 50 MG tablet Take 2 tablets (100 mg total) by mouth at bedtime. 11/07/16   Elveria Rising, NP    Family History Family History  Problem Relation Age of Onset  . Seizures Mother   . Seizures Sister        1 sister has  . Pancreatic cancer Sister   . Colon cancer Neg Hx   . Colon polyps Neg Hx   . Esophageal cancer Neg Hx   . Rectal cancer Neg Hx   . Stomach cancer Neg Hx     Social History Social History   Tobacco Use  . Smoking status: Never Smoker  . Smokeless tobacco: Never Used  Substance Use Topics  . Alcohol use: No    Alcohol/week: 0.0 oz  . Drug use: No     Allergies   Abilify [aripiprazole]; Seroquel [quetiapine fumarate]; Doxycycline; Amoxicillin-pot clavulanate; and  Keppra [levetiracetam]   Review of Systems Review of Systems  Reason unable to perform ROS: Mental retardation.  Constitutional: Positive for fever.  HENT: Positive for congestion and rhinorrhea.   Genitourinary: Positive for difficulty urinating.       Darker and foul smelling urine. Increased urinary incontinence.  All other systems reviewed and are negative.    Physical Exam Updated Vital Signs BP 109/75   Pulse (!) 113   Temp 99.3 F (37.4 C) (Oral)   Resp 18   SpO2 98%   Physical Exam  Constitutional: She is oriented to person, place, and time. She appears well-developed and well-nourished. No distress.  NAD. Smiling. Asking for chips.  HENT:  Head: Normocephalic and atraumatic.  Right Ear: External ear normal.  Left Ear: External ear normal.  Nose: Nose normal.  Mild mucosal edema, no rhinorrhea. No sinus tenderness. Moist mucous membranes. Oropharynx and tonsils normal.  Eyes: Conjunctivae and EOM are normal. No scleral icterus.  Neck: Normal range of motion. Neck supple.  Cardiovascular: Normal rate, regular rhythm and normal heart sounds.  No murmur heard. Pulmonary/Chest: Effort normal and breath sounds normal.  She has no wheezes.  Abdominal: Soft. There is no tenderness.  No suprapubic or CVA tenderness. No guarding, rigidity, rebound.  Musculoskeletal: Normal range of motion. She exhibits no deformity.  Neurological: She is alert and oriented to person, place, and time.  Skin: Skin is warm and dry. Capillary refill takes less than 2 seconds.  Psychiatric: She has a normal mood and affect. Her behavior is normal. Judgment and thought content normal.  Nursing note and vitals reviewed.    ED Treatments / Results  Labs (all labs ordered are listed, but only abnormal results are displayed) Labs Reviewed  CBC WITH DIFFERENTIAL/PLATELET - Abnormal; Notable for the following components:      Result Value   WBC 12.3 (*)    RBC 3.82 (*)    Hemoglobin 11.4 (*)    HCT 35.5 (*)    Lymphs Abs 4.5 (*)    Monocytes Absolute 1.3 (*)    All other components within normal limits  BASIC METABOLIC PANEL - Abnormal; Notable for the following components:   CO2 20 (*)    Calcium 8.7 (*)    All other components within normal limits  URINALYSIS, ROUTINE W REFLEX MICROSCOPIC - Abnormal; Notable for the following components:   Color, Urine AMBER (*)    APPearance HAZY (*)    Glucose, UA 150 (*)    Bilirubin Urine SMALL (*)    Ketones, ur 20 (*)    Protein, ur 30 (*)    Leukocytes, UA TRACE (*)    Bacteria, UA RARE (*)    Squamous Epithelial / LPF 0-5 (*)    All other components within normal limits  I-STAT CG4 LACTIC ACID, ED - Abnormal; Notable for the following components:   Lactic Acid, Venous 1.93 (*)    All other components within normal limits  URINE CULTURE  I-STAT CG4 LACTIC ACID, ED    EKG  EKG Interpretation None       Radiology Dg Chest 2 View  Result Date: 04/22/2017 CLINICAL DATA:  Fever and leukocytosis EXAM: CHEST  2 VIEW COMPARISON:  04/17/2017 FINDINGS: Low lung volumes. Patchy atelectasis at the left base. Stable cardiomediastinal silhouette. No pneumothorax. IMPRESSION: Low  lung volumes with patchy atelectasis at the left lung base. No focal consolidation. Electronically Signed   By: Lolamae Pang M.D.   On: 04/22/2017 00:59  Procedures Procedures (including critical care time)  Medications Ordered in ED Medications  sodium chloride 0.9 % bolus 1,000 mL (1,000 mLs Intravenous New Bag/Given 04/22/17 0029)  cefTRIAXone (ROCEPHIN) 1 g in dextrose 5 % 50 mL IVPB (1 g Intravenous New Bag/Given 04/22/17 0029)     Initial Impression / Assessment and Plan / ED Course  I have reviewed the triage vital signs and the nursing notes.  Pertinent labs & imaging results that were available during my care of the patient were reviewed by me and considered in my medical decision making (see chart for details).  Clinical Course as of Apr 22 109  Mon Apr 21, 2017  2348 Appearance: (!) HAZY [CG]  2348 Ketones, ur: (!) 20 [CG]  2348 Leukocytes, UA: (!) TRACE [CG]  2348 WBC: (!) 12.3 [CG]  2348 Lactic Acid, Venous: (!) 1.93 [CG]    Clinical Course User Index [CG] Liberty HandyGibbons, Samariyah Cowles J, PA-C   22 year old with history of mental retardation, recurrent UTIs, recurrent sinusitis here for evaluation of recurrent UTI. Also having flare of sinusitis. Began having her typical UTI/sinusitis symptoms on Thursday, seen by PCP who sent urinalysis and culture. Culture 1/31 grew proteus. She has no signs of pyelonephritis. Abdomen tender. No fever. Has had good response to augmentin syrup in the past. Rash from augmentin pills per mom but no problems with syrup.   U/A is contaminated, resent for culture. Mild leukocytosis WBC 12.3 and elevated lactic acid 1.93. No AKI. She is tolerating PO. Patient will get rocephin, IV in ED and d/c with augmentin. Explain to mother urine is sensitive to these antibiotics an she is agreeable. She is to f/u with urology/PCP in 2 days for re-check. Discussed return precautions. Pt discussed with SP.   Final Clinical Impressions(s) / ED Diagnoses   Final  diagnoses:  Recurrent urinary tract infection    ED Discharge Orders        Ordered    amoxicillin-clavulanate (AUGMENTIN) 875-125 MG tablet  Every 12 hours,   Status:  Discontinued     04/22/17 0107    amoxicillin-clavulanate (AUGMENTIN) 250-62.5 MG/5ML suspension  2 times daily     04/22/17 0110       Liberty HandyGibbons, Marlei Glomski J, PA-C 04/22/17 0111    Margarita Grizzleay, Danielle, MD 04/23/17 1306

## 2017-04-21 NOTE — Telephone Encounter (Signed)
Routing to Rebeca. Please advise. Thank you

## 2017-04-21 NOTE — Telephone Encounter (Signed)
Copied from CRM [id number]. Topic: General - Other >> Apr 21, 2017  8:38 AM Debroah LoopLander, Lumin L wrote: Reason for CRM: Mom, calling to notify Dr. Okey Duprerawford amd Ria ClockLaura Murray that the patient is still having nose bleeds and vomiting and her x-ray from pulmonary shows an infection but not sure what kind. She will be starting antibiotics today.   >> Apr 21, 2017  8:51 AM Windy KalataMichael, Taylor L, NT wrote: [facility] ( Dr. Nurse? )   [phone]

## 2017-04-22 ENCOUNTER — Emergency Department (HOSPITAL_COMMUNITY): Payer: Medicare Other

## 2017-04-22 DIAGNOSIS — N390 Urinary tract infection, site not specified: Secondary | ICD-10-CM | POA: Diagnosis not present

## 2017-04-22 MED ORDER — CEFDINIR 300 MG PO CAPS: 300.0000 mg | ORAL_CAPSULE | Freq: Two times a day (BID) | ORAL | 0 refills | Status: DC

## 2017-04-22 MED ORDER — AMOXICILLIN-POT CLAVULANATE 875-125 MG PO TABS: 1.0000 | ORAL_TABLET | Freq: Two times a day (BID) | ORAL | 0 refills | Status: DC

## 2017-04-22 MED ORDER — AMOXICILLIN-POT CLAVULANATE 250-62.5 MG/5ML PO SUSR: 500.0000 mg | Freq: Two times a day (BID) | ORAL | 0 refills | Status: DC

## 2017-04-22 MED ORDER — LACTINEX PO PACK: PACK | ORAL | 0 refills | Status: DC

## 2017-04-22 NOTE — Telephone Encounter (Signed)
Mother calling, Patient was prescribed an antibiotic and lactobacillus. states she was given augmentin 875-125, she is allergic.  Call back [phone]

## 2017-04-22 NOTE — Discharge Instructions (Addendum)
Your urine sample today was contaminated. We used recent culture to give you antibiotic that works with the bacteria your urine grew.   Discuss with your doctor whether of not your child has taken [facility] for this infection before. If not, take Omnicef as prescribed until finished. If your infection has known to be resistant to [facility] do NOT fill this prescription and take Augmentin as prescribed instead. Stay well hydrated. Either of these antibiotics should also help if you have a bacterial sinusitis. We did a chest x-ray given reported fever, but this was normal. No pneumonia.   Follow up with your doctor in 1-2 days to ensure there is improvement.

## 2017-04-22 NOTE — ED Provider Notes (Signed)
1:50 AM Per RN, patient's mother desiring antibiotics to be prescribed in a pill form rather than a liquid form.   On further review of the patient's chart, she appears to be allergic to Augmentin.  This was confirmed with mother who states the medication was recommended because it seemed to be the only option to treat the patient's infection.  I did contact Fayrene FearingJames, pharmacist, who reviewed the urine culture results.  With sinusitis symptoms and UTI, he recommends treatment with Omnicef 300 mg every 12 hours.  Will prescribe this with Lactinex to try and prevent diarrhea.   2:12 AM Mother reports that patient has taken Omnicef in the past for infection without successful treatment.  Have advised that she consult with her primary care doctor on whether this antibiotic would be recommended.  If not, we will proceed with Augmentin and have provided appropriate precautions given history of allergy.  Mother verbalizes understanding with the plan to fill either Augmentin OR Omnicef and not have the patient take these antibiotics together.   Antony MaduraHumes, Graziella Connery, PA-C 04/22/17 [id number]    Ward, Layla MawKristen N, DO 04/22/17 [id number]

## 2017-04-22 NOTE — ED Notes (Signed)
Unable to obtain signature from parent d/t malfunction of signature pad. Pt departed in NAD.

## 2017-04-23 ENCOUNTER — Institutional Professional Consult (permissible substitution): Payer: Medicare Other | Admitting: Pulmonary Disease

## 2017-04-23 LAB — URINE CULTURE: Culture: <10000 — AB

## 2017-04-23 NOTE — Telephone Encounter (Signed)
Spoke with Patient's mother, daughter is improving. Mother stated she had some hives from antibiotic but treated with oral benadryl which helped. Mother states her daughter is retaining a lot of fluid-difficulty waliking since she stopped torsemide, she cannot wear compression stockings as recommended would like to know if there is something else she can take? Please advise

## 2017-04-24 ENCOUNTER — Telehealth: Payer: Self-pay | Admitting: Cardiology

## 2017-04-24 NOTE — Telephone Encounter (Signed)
Would need visit cannot advise over phone regarding this complex issue.

## 2017-04-24 NOTE — Telephone Encounter (Signed)
Called mother back taken care of in other telephone note

## 2017-04-24 NOTE — Telephone Encounter (Signed)
FYI Called patients mother and she states that [place]Frankie- IT sales professionalT manager or Herbert SetaHeather ford will be getting in contact with us about getting a nurse out at the house. Mother states that patients legs are so swollen she can't get shoes on her feet and that she is wetting everywhere and that is why she does not want to bring her in. Mother states that the patient cant use the stocking because they cannot get them on anymore and the swelling seems to be going up her legs. Was informed that we cannot send anything in without seeing patient she stated understanding. And will call later to schedule an appointment.

## 2017-04-24 NOTE — Telephone Encounter (Addendum)
Pt w/ current illness/fever. Advised HR not abnormal during illness. Mom will continue to monitor and call back if pt remains elevated when in better health, symptoms begin w/ elevated rates and/or HR remains elevated.  Mom is appreciative of my call.

## 2017-04-24 NOTE — Telephone Encounter (Signed)
[name] states she is not bringing her in she is wetting everywhere and she wants Dr. Frutoso Chaserawford's nurse to call her back. I have explained Dr. Okey Duprerawford is not going to call anything in over the phone she must come in and be seen.

## 2017-04-24 NOTE — Telephone Encounter (Signed)
New message  Patient mother calling to report HR. Please call  STAT if HR is under 50 or over 120 (normal HR is 60-100 beats per minute)  1) What is your heart rate? 120  2) Do you have a log of your heart rate readings (document readings)?   3) Do you have any other symptoms? Fever

## 2017-04-24 NOTE — Telephone Encounter (Unsigned)
Copied from CRM [id number]. Topic: General - Other >> Apr 24, 2017  9:38 AM Raquel SarnaHayes, Teresa G wrote: Tomma LightningFrankie - PT  manager - is going to ask Dr. Okey Duprerawford to approve for pt to have a nurse to come in home to evaluate pt.   Swelling is worse and fluid pill is not working.  Nose is still bleeding.  Feet are very swollen.  Pt has uncontrolled urination.  Pt has received the bed and wheelchair.  Please call pt's mother to discuss.

## 2017-04-25 ENCOUNTER — Encounter (INDEPENDENT_AMBULATORY_CARE_PROVIDER_SITE_OTHER): Payer: Self-pay | Admitting: Pediatrics

## 2017-04-25 ENCOUNTER — Telehealth (INDEPENDENT_AMBULATORY_CARE_PROVIDER_SITE_OTHER): Payer: Self-pay | Admitting: Family

## 2017-04-25 ENCOUNTER — Telehealth: Payer: Self-pay | Admitting: Pulmonary Disease

## 2017-04-25 NOTE — Telephone Encounter (Signed)
[name] called me today to ask for order for CPAP to be sent to [facility]. I talked with Mom and she said that Lori Miles was not scheduled to see pulmonology until the end of this month and that she was gasping and having increased difficulty during sleep. I called [facility] and was told that the current sleep study was more than 6 months old and that to get CPAP she needed new sleep study, new orders and that she would have to qualify by need based on the sleep study. I called Mom back and explained that to her. I told her that she would need to see the pulmonology dr later this month to discuss this issue.   I also told Mom that the prolonged EEG done earlier was normal. Mom had no further questions. TG

## 2017-04-25 NOTE — Telephone Encounter (Signed)
Called and spoke with pt's mother Was able to move appt from 05-14-17 to 05-02-17 [name] was happy as pt is not sleeping well at night Nothing further needed at this time

## 2017-04-25 NOTE — Progress Notes (Signed)
AMBULATORY ELECTROENCEPHALOGRAM WITH VIDEO     PATIENT NAME:  Lori Miles GENDER: Female DATE OF BIRTH:  May 16, 1995 STUDY NAME: [id number] ORDERED: 48 Hour Ambulatory with Video DURATION: 42 Hours with Video STUDY START DATE/TIME: 1/29 at 3:00 PM STUDY END DATE/TIME: 1/31 at 8:41 AM BILLING DAYS: 2 READING PHYSICIAN:  Ellison Carwin, M.D. REFERRING PHYSICIAN: Elveria Rising, NP TECHNOLOGIST: Lenell Antu, R.EEG T VIDEO: Yes EKG: Yes  AUDIO: Yes   MEDICATIONS: Depakote Clonidine Trazodone  CLINICAL NOTES This is a 48-hour video ambulatory EEG study that was recorded for 42 hours in duration. The study was recorded from April 15, 2017 to April 17, 2017 being remotely monitored by a registered technologist to insure integrity of the video and EEG for the entire duration of the recording. If needed the physician was contacted to intervene with the option to diagnose and treat the patient and alter or end the recording. he patient was educated on the procedure prior to starting the study. The patients head was measured and marked using the international 10/20 system, 23 channel digital bipolar EEG connections (over temporal over parasagittal montage).  Additional channels for EOG and EKG.  Recording was continuous and recorded in a bipolar montage that can be re-montaged.  Calibration and impedances were recorded in all channels at 10kohms. The EEG may be flagged at the direction of the patient by the use of a push button. The AEEG was analyzed using the [employer] IEEG spike and seizure analysis. All captured spike and seizures were reviewed by the scanning technologist. A Patient Daily Log" sheet is provided to document patient daily activities as well as "Patient Event Log" sheet for any episodes in question.  HYPERVENTILATION Hyperventilation was not performed for this study.   PHOTIC STIMULATION Photic Stimulation was not performed for this study.   HISTORY The  patient is a 22- year-old female with a history of seizure like events. She had seizure like events when she was a child, the events have resumed recently. Mother states she falls and shakes. 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 This study was ordered for evaluation.   SLEEP FEATURES Stages 1, 2, 3, and REM sleep were observed. The patient had a couple of arousals over the night and slept for about 10 hours. Sleep variants like sleep spindles, vertex sharp waves and k-complexes were all noted during sleeping portions of the study.  Day 1 - Sleep at 8:16 PM; Wake at 6:45 AM Day 2 - Sleep at 7:32 PM; Wake at 6:44 AM  SUMMARY The study was recorded and remotely monitored by a registered technologist for 42 hours to insure integrity of the video and EEG for the entire duration of the recording. The patient returned the Patient Log Sheets. Dominate background rhythm of 8 -9 Hz with an average amplitude of 31uV, predominately seen in the posterior regions was noted during waking hours. Background was reactive to eye movements, attenuated with opening and repopulated with closure. EKG artifact is noted throughout the study. There were no apparent abnormalities or asymmetries noted by the scanning technologist.  All and any possible abnormalities have been clipped for further review by the physician.   EVENTS The patient logged 1 event and there were no "patient event" button pushes noted.  #1 - 1/30 at 2:59 PM Event button was not pressed. Mother logged, "shaking, trembling, seizure" patient was seen on camera for this event. Patient is resting on couch with light sleep, moving  is seen possibly shaking, family member writes event down, there were no EEG correlations noted.  SPIKE/SEIZURE DETECTION Seizure and Spike analysis were performed and reviewed. There were no epileptiform discharges noted throughout the recording. The usual muscle,  chewing, eye movement and wire sway artifacts were noted.   EKG EKG was regular with a heart rate of 90 bpm with no arrhythmias noted.   Impressions are by ABRET registered EEG technologist with [facility] and does not signify final interpretation, physician can and may over-ride these findings upon review. Scanned by Annell GreeningMary Kay Johnston, R.EEG T, CLTM   PHYSICAN CONCLUSION/IMPRESSION: I reviewed the entire record and agree that the study is normal awake and asleep.  There was no evidence of change in background activity throughout the record and especially during the time the patient had a clinical event.  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   X__William H. Hickling, M.D.________________________________ Ellison CarwinWilliam Hickling, M.D.         8-9 Hz/31uV   Posterior dominate rhythm   Day 1 sleep onset   Day sleep onset    Patient Event #1 1/30 at 2:59 PM Mother logged, "shaking, trembling, seizure"     REM Sleep   Sleep

## 2017-04-29 ENCOUNTER — Telehealth: Payer: Self-pay | Admitting: *Deleted

## 2017-04-29 NOTE — Telephone Encounter (Signed)
Pt called in wanting to up the dosage of the Diflucan to 250 mg, as previously discussed stating she is currently taking 200 mg that was prescribed by Dr. Clearance CootsHarper, she would also like a callback from [facility], please advise.Marland Kitchen..Marland Kitchen

## 2017-04-30 ENCOUNTER — Other Ambulatory Visit: Payer: Self-pay | Admitting: *Deleted

## 2017-04-30 ENCOUNTER — Other Ambulatory Visit: Payer: Self-pay | Admitting: Certified Nurse Midwife

## 2017-04-30 DIAGNOSIS — T3695XA Adverse effect of unspecified systemic antibiotic, initial encounter: Principal | ICD-10-CM

## 2017-04-30 DIAGNOSIS — B379 Candidiasis, unspecified: Secondary | ICD-10-CM

## 2017-04-30 MED ORDER — FLUCONAZOLE 100 MG PO TABS
100.0000 mg | ORAL_TABLET | Freq: Every day | ORAL | 3 refills | Status: DC
Start: 2017-04-30 — End: 2017-08-20

## 2017-04-30 MED ORDER — TERCONAZOLE 0.8 % VA CREA: TOPICAL_CREAM | VAGINAL | 3 refills | Status: DC

## 2017-04-30 NOTE — Progress Notes (Signed)
Pt mother called to office stating that her daughter needs treatment for yeast. Pt mother states that Lori Miles has been getting IV antibiotics and has developed a severe yeast infection due to use of pull ups and would like tx.  Reviewed with R.Denney CNM-  Approved to send Terazol 3 and Diflucan 100mg  per day.  Rx sent to pharmacy and mother made aware.  Advised to also use Vaseline as a barrier cream to help protect skin. Advised to contact office if any problems after tx.

## 2017-04-30 NOTE — Telephone Encounter (Signed)
Diflucan only comes in 200 mg.  I will forward this onto [place].   Thank you.

## 2017-05-02 ENCOUNTER — Telehealth: Payer: Self-pay | Admitting: Pulmonary Disease

## 2017-05-02 ENCOUNTER — Encounter: Payer: Self-pay | Admitting: Pulmonary Disease

## 2017-05-02 ENCOUNTER — Ambulatory Visit (INDEPENDENT_AMBULATORY_CARE_PROVIDER_SITE_OTHER): Payer: Medicare Other | Admitting: Pulmonary Disease

## 2017-05-02 ENCOUNTER — Telehealth: Payer: Self-pay | Admitting: Internal Medicine

## 2017-05-02 VITALS — BP 118/66 | HR 106 | Ht <= 58 in | Wt 189.0 lb

## 2017-05-02 DIAGNOSIS — Z6841 Body Mass Index (BMI) 40.0 and over, adult: Secondary | ICD-10-CM | POA: Diagnosis not present

## 2017-05-02 DIAGNOSIS — R29818 Other symptoms and signs involving the nervous system: Secondary | ICD-10-CM | POA: Diagnosis not present

## 2017-05-02 NOTE — Telephone Encounter (Signed)
Copied from CRM [id number]. Topic: Quick Communication - See Telephone Encounter >> May 02, 2017  8:06 AM Louie BunPalacios Medina, [name] wrote: CRM for notification. See Telephone encounter for: 05/02/17. Patient mom called and said that if Dr. Okey Duprerawford can put an order in for urine test for patient. She is still not feeling well and patient is urine every where. The UTI is still there and not going around. Please call patient mom [name].

## 2017-05-02 NOTE — Progress Notes (Signed)
   Subjective:    Patient ID: Daneen SchickJasmine Hauth, female    DOB: 1995/04/24, 22 y.o.   MRN: [id number]  HPI    Review of Systems  Constitutional: Negative for fever and unexpected weight change.  HENT: Positive for postnasal drip and sinus pressure. Negative for congestion, dental problem, ear pain, nosebleeds, rhinorrhea, sneezing, sore throat and trouble swallowing.   Eyes: Negative for redness and itching.  Respiratory: Positive for cough, chest tightness, shortness of breath and wheezing.   Cardiovascular: Positive for leg swelling. Negative for palpitations.  Gastrointestinal: Negative for nausea and vomiting.  Genitourinary: Negative for dysuria.  Musculoskeletal: Positive for joint swelling.  Skin: Negative for rash.  Allergic/Immunologic: Positive for environmental allergies and food allergies. Negative for immunocompromised state.  Neurological: Negative for headaches.  Hematological: Does not bruise/bleed easily.  Psychiatric/Behavioral: Negative for dysphoric mood. The patient is nervous/anxious.        Objective:   Physical Exam        Assessment & Plan:

## 2017-05-02 NOTE — Telephone Encounter (Signed)
Called and spoke with patients mother. She wanted to let Dr. Craige CottaSood know that patient was previously seen by Dr. Kendrick FriesMcquaid. Nothing further needed.

## 2017-05-02 NOTE — Telephone Encounter (Signed)
Can you please schedule patient for an appointment thank you

## 2017-05-02 NOTE — Progress Notes (Signed)
[facility]  Chief Complaint  Patient presents with  . sleep consult    Pt's day and nights are mixed up, pt snores loudly, gasps for air, stops breathing on occassion. Pt wakes up choking and cannot breath in the mornings, and some during the night. Pt has productive cough-yellow/green sometimes blood, wheezing, and SOB with exertion.    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  She has trouble staying awake.  She falls asleep randomly during the day.  She snores, and will wake up choking.  She then gags and coughs.  She has been using trazodone and melatonin to help sleep, but these don't make a difference.  She isn't using anything to help stay awake.  She will sometimes talk in her sleep.  She had a sleep study previously that didn't show sleep apnea.  Her Epworth score is 24 out of 24.   Physical Exam:  General - pleasant Eyes - pupils reactive ENT - no sinus tenderness, no oral exudate, no LAN, MP 4 Cardiac - regular, no murmur Chest - no wheeze, rales Abd - soft, non tender Ext - no edema Skin - no rashes Neuro - normal strength Psych - normal mood  Discussion: She has snoring, apnea, sleep disruption, and daytime sleepiness.  She feel asleep several times during examination today.  I am still concerned she could have sleep apnea.  Assessment/Plan:  Suspected sleep apnea. - will arrange for home sleep study   Obesity. - discussed importance of weight loss  Asthma, allergic rhinitis. - she is followed by Dr. Lucie Leather   Patient Instructions  Will arrange for home sleep study Will call to arrange for follow up after sleep study reviewed    Coralyn Helling, MD [facility] 05/02/2017, 2:36 PM Pager:   [phone]  Flow Sheet  Pulmonary tests: Spirometry 02/12/17 >> FEV1 1.58 (69%), FEV1% 93  Sleep tests:  Cardiac tests: Echo 05/29/15 >> EF 55 to 60%  Review of Systems: Constitutional: Negative for fever and unexpected weight change.  HENT: Positive for postnasal drip and sinus pressure. Negative for congestion, dental problem, ear pain, nosebleeds, rhinorrhea, sneezing, sore throat and trouble swallowing.   Eyes: Negative for redness and itching.  Respiratory: Positive for cough, chest tightness, shortness of breath and wheezing.   Cardiovascular: Positive for leg swelling. Negative for palpitations.  Gastrointestinal: Negative for nausea and vomiting.  Genitourinary: Negative for dysuria.  Musculoskeletal: Positive for joint swelling.  Skin: Negative for rash.  Allergic/Immunologic: Positive for environmental allergies and food allergies. Negative for immunocompromised state.  Neurological: Negative for headaches.  Hematological: Does not bruise/bleed easily.  Psychiatric/Behavioral: Negative for dysphoric mood. The patient is nervous/anxious.    Past Medical History: She  has a past medical history of Allergy, Asthma, Autism, Bronchitis, Constipation, Frequency-urgency syndrome, GERD (gastroesophageal reflux disease), Interstitial cystitis, Mental deficiency, Pneumonia, PONV (postoperative nausea and vomiting), Seizure (HCC), and Sleep apnea.  Past Surgical History: She  has a past surgical history that includes Cystoscopy; Cystoscopy with hydrodistension and biopsy (04/14/2012); and Tonsillectomy.  Family History: Her family history includes Pancreatic cancer in her sister; Seizures in her mother and sister.  Social History: She  reports that  has never  smoked. she has never used smokeless tobacco. She reports that she does not drink alcohol or use drugs.  Medications: Allergies as of 05/02/2017      Reactions   Abilify [aripiprazole] Swelling, Palpitations   Seroquel  [quetiapine Fumarate] Palpitations   Doxycycline Other (See Comments)   Stomach cramps   Amoxicillin-pot Clavulanate Hives   Keppra [levetiracetam] Other (See Comments)   Aggressive behavior       Medication List        Accurate as of 05/02/17  2:36 PM. Always use your most recent med list.          acetaminophen 325 MG tablet Commonly known as:  TYLENOL Take 2 tablets (650 mg total) by mouth every 6 (six) hours as needed for mild pain or moderate pain.   albuterol (2.5 MG/3ML) 0.083% nebulizer solution Commonly known as:  PROVENTIL Inhale the contents of one vial in nebulizer every four to six hours as needed for cough or wheeze.   amoxicillin-clavulanate 875-125 MG tablet Commonly known as:  AUGMENTIN Take 1 tablet by mouth every 12 (twelve) hours.   budesonide 1 MG/2ML nebulizer solution Commonly known as:  PULMICORT Take 2 mLs (1 mg total) by nebulization 2 (two) times daily.   cefdinir 300 MG capsule Commonly known as:  OMNICEF Take 1 capsule (300 mg total) by mouth 2 (two) times daily.   cetirizine 10 MG tablet Commonly known as:  ZYRTEC Take one tablet daily for runny nose or itching.   cloNIDine 0.1 MG tablet Commonly known as:  CATAPRES take 1 and 1/2 tablet by mouth at bedtime   Cranberry 500 MG Caps Take 500 mg by mouth daily. Reported on 09/07/2015   DEPAKOTE 500 MG DR tablet Generic drug:  divalproex Give 1 tablet in the morning and 2 tablets at night   fluconazole 200 MG tablet Commonly known as:  DIFLUCAN   fluconazole 100 MG tablet Commonly known as:  DIFLUCAN Take 1 tablet (100 mg total) by mouth daily.   formoterol 20 MCG/2ML nebulizer solution Commonly known as:  PERFOROMIST Take 2 mLs (20 mcg total) by nebulization 2 (two) times daily.   hydrOXYzine 25 MG tablet Commonly known as:  ATARAX/VISTARIL Take 1 tablet (25 mg total) by mouth every 6 (six) hours.   hyoscyamine 0.125 MG SL tablet Commonly known as:  LEVSIN SL DISSOLVE  0.125mg  TABLET UNDER THE TONGUE 2 TIMES A DAY   LACTINEX Pack Mix 1/2 packet with soft food and give twice a day for 5 days.   levofloxacin 750 MG tablet Commonly known as:  LEVAQUIN Take 1 tablet (750 mg total) by mouth daily.   Melatonin 3 MG Tabs Take 3 mg by mouth at bedtime. For insomnia   montelukast 10 MG tablet Commonly known as:  SINGULAIR Take one tablet each evening to prevent cough or wheeze.   olopatadine 0.1 % ophthalmic solution Commonly known as:  PATANOL Place 1 drop into both eyes 2 (two) times daily as needed for allergies.   ondansetron 4 MG tablet Commonly known as:  ZOFRAN Take 1 tablet (4 mg total) by mouth 2 (two) times daily.   ranitidine 150 MG capsule Commonly known as:  ZANTAC Take 1 capsule (150 mg total) 2 (two) times daily by mouth.   SEASONIQUE 0.15-0.03 &0.01 MG tablet Generic drug:  Levonorgestrel-Ethinyl Estradiol Take 1 tablet by mouth daily.   senna 8.6 MG tablet Commonly known as:  SENOKOT Take 1 tablet by mouth daily.   terconazole 0.8 %  vaginal cream Commonly known as:  TERAZOL 3 Use topically as needed for yeast.   traZODone 50 MG tablet Commonly known as:  DESYREL Take 2 tablets (100 mg total) by mouth at bedtime.

## 2017-05-02 NOTE — Patient Instructions (Signed)
Will arrange for home sleep study Will call to arrange for follow up after sleep study reviewed  

## 2017-05-02 NOTE — Telephone Encounter (Signed)
Would need to come in for evaluation

## 2017-05-02 NOTE — Telephone Encounter (Signed)
Called mother and she will call back to make appointment

## 2017-05-08 ENCOUNTER — Other Ambulatory Visit: Payer: Self-pay

## 2017-05-08 MED ORDER — FORMOTEROL FUMARATE 20 MCG/2ML IN NEBU: 20.0000 ug | INHALATION_SOLUTION | Freq: Two times a day (BID) | RESPIRATORY_TRACT | 5 refills | Status: DC

## 2017-05-12 ENCOUNTER — Other Ambulatory Visit: Payer: Self-pay | Admitting: Allergy and Immunology

## 2017-05-12 ENCOUNTER — Telehealth: Payer: Self-pay | Admitting: Pulmonary Disease

## 2017-05-12 ENCOUNTER — Encounter: Payer: Self-pay | Admitting: Pulmonary Disease

## 2017-05-12 NOTE — Telephone Encounter (Signed)
Called and spoke with patients mother, she states that someone from our office keeps calling her and not leaving messages. [facility] to not seeing any documentation that someone has called. Patients mother asked that kelly give her a call.

## 2017-05-12 NOTE — Telephone Encounter (Signed)
I had tried to call last week to o schedule home sleep study & tried again this morning - mailbox is full.  Just spoke to mom & scheduled study.  Nothing further needed at this time.

## 2017-05-12 NOTE — Telephone Encounter (Signed)
[facility] this patient's mother Someone has tried contacting her, I have not reached out to this patient recently

## 2017-05-14 ENCOUNTER — Institutional Professional Consult (permissible substitution): Payer: Medicare Other | Admitting: Pulmonary Disease

## 2017-05-15 ENCOUNTER — Telehealth: Payer: Self-pay | Admitting: Cardiology

## 2017-05-15 NOTE — Telephone Encounter (Signed)
New Message   Patients mother is calling and requesting a call back. Did not want to provide any information as to why. Please call.

## 2017-05-15 NOTE — Telephone Encounter (Signed)
Patient's mother states she calling to inform Sherrie that patient's HR is still elevated during activity. Patient was working with PT today. At rest HR was 101, while walking for 12 mins patient HR increased to 150s. She also stated that patient had a UTI and was refusing to take pills, so has been getting IV antibiotics. Informed her that Charlton AmorSherrie was off today but I would forward to Sherrie to review once she returns. She verbalized understanding and thanked me for the call

## 2017-05-16 NOTE — Telephone Encounter (Signed)
Mom tells me pt is still having elevated HR issues. She reports ongoing uti for the last year/more. Last dose of steroids pt recievied was January.  Mom states she will not given her anymore steroids b/c dtr doesn't do well on them. She takes Albuterol "once in awhile".  Last yesterday.  She reports pt is having a hard time walking.  She is too tired to ambulate. Reports dtr also has gout in her feet.  Informed that I would discuss w/ Camnitz next week and call her w/ advisement.  Explained that I don't think there is anything to do at this point.  Further explained that d/t pt's deconditioned state/ongoing uti & fever and asthma meds her HRs are related to these issues.

## 2017-05-21 ENCOUNTER — Other Ambulatory Visit: Payer: Self-pay

## 2017-05-21 ENCOUNTER — Emergency Department (HOSPITAL_COMMUNITY)
Admission: EM | Admit: 2017-05-21 | Discharge: 2017-05-21 | Disposition: A | Discharge status: Home / Self Care | Payer: Medicare Other | Attending: Emergency Medicine | Admitting: Emergency Medicine

## 2017-05-21 ENCOUNTER — Emergency Department (HOSPITAL_COMMUNITY): Payer: Medicare Other

## 2017-05-21 ENCOUNTER — Encounter (HOSPITAL_COMMUNITY): Payer: Self-pay | Admitting: Emergency Medicine

## 2017-05-21 DIAGNOSIS — J45909 Unspecified asthma, uncomplicated: Secondary | ICD-10-CM | POA: Diagnosis not present

## 2017-05-21 DIAGNOSIS — F840 Autistic disorder: Secondary | ICD-10-CM | POA: Diagnosis not present

## 2017-05-21 DIAGNOSIS — E860 Dehydration: Secondary | ICD-10-CM

## 2017-05-21 DIAGNOSIS — I959 Hypotension, unspecified: Secondary | ICD-10-CM | POA: Insufficient documentation

## 2017-05-21 DIAGNOSIS — R1111 Vomiting without nausea: Secondary | ICD-10-CM | POA: Insufficient documentation

## 2017-05-21 DIAGNOSIS — F79 Unspecified intellectual disabilities: Secondary | ICD-10-CM | POA: Insufficient documentation

## 2017-05-21 DIAGNOSIS — K5900 Constipation, unspecified: Secondary | ICD-10-CM | POA: Diagnosis not present

## 2017-05-21 DIAGNOSIS — Z79899 Other long term (current) drug therapy: Secondary | ICD-10-CM | POA: Insufficient documentation

## 2017-05-21 DIAGNOSIS — N390 Urinary tract infection, site not specified: Secondary | ICD-10-CM | POA: Diagnosis not present

## 2017-05-21 DIAGNOSIS — R1110 Vomiting, unspecified: Secondary | ICD-10-CM

## 2017-05-21 LAB — BASIC METABOLIC PANEL
Anion gap: 10 (ref 5–15)
BUN: 8 mg/dL (ref 6–20)
CALCIUM: 8.4 mg/dL — AB (ref 8.9–10.3)
CO2: 22 mmol/L (ref 22–32)
CREATININE: 0.87 mg/dL (ref 0.44–1.00)
Chloride: 107 mmol/L (ref 101–111)
GFR calc non Af Amer: >60 mL/min (ref >60)
Glucose, Bld: 193 mg/dL — ABNORMAL HIGH (ref 65–99)
Potassium: 3.6 mmol/L (ref 3.5–5.1)
Sodium: 139 mmol/L (ref 135–145)

## 2017-05-21 LAB — CBC WITH DIFFERENTIAL/PLATELET
BASOS ABS: 0 10*3/uL (ref 0.0–0.1)
Basophils Relative: 0 %
EOS ABS: 0 10*3/uL (ref 0.0–0.7)
EOS PCT: 0 %
HCT: 35.9 % — ABNORMAL LOW (ref 36.0–46.0)
Hemoglobin: 11.7 g/dL — ABNORMAL LOW (ref 12.0–15.0)
LYMPHS PCT: 45 %
Lymphs Abs: 3.6 10*3/uL (ref 0.7–4.0)
MCH: 30.3 pg (ref 26.0–34.0)
MCHC: 32.6 g/dL (ref 30.0–36.0)
MCV: 93 fL (ref 78.0–100.0)
MONO ABS: 0.7 10*3/uL (ref 0.1–1.0)
Monocytes Relative: 8 %
Neutro Abs: 3.8 10*3/uL (ref 1.7–7.7)
Neutrophils Relative %: 47 %
PLATELETS: 218 10*3/uL (ref 150–400)
RBC: 3.86 MIL/uL — ABNORMAL LOW (ref 3.87–5.11)
RDW: 14.4 % (ref 11.5–15.5)
WBC: 8 10*3/uL (ref 4.0–10.5)

## 2017-05-21 LAB — HEPATIC FUNCTION PANEL
ALT: 11 U/L — ABNORMAL LOW (ref 14–54)
AST: 21 U/L (ref 15–41)
Albumin: 2.9 g/dL — ABNORMAL LOW (ref 3.5–5.0)
Alkaline Phosphatase: 48 U/L (ref 38–126)
Bilirubin, Direct: 0.2 mg/dL (ref 0.1–0.5)
Indirect Bilirubin: 0.2 mg/dL — ABNORMAL LOW (ref 0.3–0.9)
Total Bilirubin: 0.4 mg/dL (ref 0.3–1.2)
Total Protein: 5.9 g/dL — ABNORMAL LOW (ref 6.5–8.1)

## 2017-05-21 LAB — URINALYSIS, ROUTINE W REFLEX MICROSCOPIC
BILIRUBIN URINE: NEGATIVE
Bacteria, UA: NONE SEEN
HGB URINE DIPSTICK: NEGATIVE
KETONES UR: 20 mg/dL — AB
Leukocytes, UA: NEGATIVE
NITRITE: NEGATIVE
PROTEIN: NEGATIVE mg/dL
Specific Gravity, Urine: 1.028 (ref 1.005–1.030)
pH: 5 (ref 5.0–8.0)

## 2017-05-21 LAB — I-STAT CG4 LACTIC ACID, ED: LACTIC ACID, VENOUS: 1.85 mmol/L (ref 0.5–1.9)

## 2017-05-21 LAB — POC URINE PREG, ED: Preg Test, Ur: NEGATIVE

## 2017-05-21 MED ORDER — SODIUM CHLORIDE 0.9 % IV SOLN: 1000.0000 mL | INTRAVENOUS | Status: DC

## 2017-05-21 MED ORDER — SODIUM CHLORIDE 0.9 % IV BOLUS (SEPSIS)
1000.0000 mL | Freq: Once | INTRAVENOUS | Status: AC
  Administered 2017-05-21: 1000 mL via INTRAVENOUS

## 2017-05-21 MED ORDER — SODIUM CHLORIDE 0.9 % IV SOLN
1.0000 g | Freq: Once | INTRAVENOUS | Status: AC
  Administered 2017-05-21: 1 g via INTRAVENOUS
  Filled 2017-05-21: qty 10

## 2017-05-21 MED ORDER — AMOXICILLIN-POT CLAVULANATE 875-125 MG PO TABS: 1.0000 | ORAL_TABLET | Freq: Two times a day (BID) | ORAL | 0 refills | Status: DC

## 2017-05-21 MED ORDER — ONDANSETRON HCL 4 MG/2ML IJ SOLN
4.0000 mg | Freq: Once | INTRAMUSCULAR | Status: DC
  Filled 2017-05-21: qty 2

## 2017-05-21 MED ORDER — ONDANSETRON 4 MG PO TBDP: 4.0000 mg | ORAL_TABLET | Freq: Three times a day (TID) | ORAL | 0 refills | Status: DC | PRN

## 2017-05-21 NOTE — ED Triage Notes (Signed)
Pt brought to ED by care giver stating she is been vomiting since last night, per care giver she is possible having a UTI again.

## 2017-05-21 NOTE — ED Provider Notes (Signed)
Medical screening examination/treatment/procedure(s) were conducted as a shared visit with non-physician practitioner(s) and myself.  I personally evaluated the patient during the encounter. Briefly, the patient is a 22 y.o. female with recurrent UTIs, urinary incontinence, autism, seizure-like disorder, GERD, sleep apnea who is brought in by her mother to the emergency department today complaining of nausea/vomiting, fever and possible recurrent UTI. On review, pt grew out proteus that is MDR. Given empiric Rocephin during work up. UA reassuring, but prior UAs have not been consistent with UTI, but cultures have been positive. Other labs reassuring. CT obtained to rule out renal stones, which was negative. Will provide Rx for AUgmentin until cultures result. Pt already has close follow up with Urology. The patient is safe for discharge with strict return precautions. .    EKG Interpretation None           Cardama, Amadeo GarnetPedro Eduardo, MD 05/21/17 2042

## 2017-05-21 NOTE — Discharge Instructions (Signed)
Take antibiotics as prescribed. Follow up closely with your urologist and keep scheduled appointment for cystoscopy. Take zofran as needed fo rN/V. Encourage bland food diet for now. Continue bowel regimen at home with miralax. Return to the ED your you develop severe worsening of your symptoms, altered mental status, loss of consciousness, abdominal pain, lower than normal blood pressure.

## 2017-05-21 NOTE — ED Notes (Signed)
Pt asking for "cookout" and sipping on sprite. Denies any nausea.

## 2017-05-21 NOTE — ED Provider Notes (Signed)
[facility] EMERGENCY DEPARTMENT Provider Note   CSN: [id number] Arrival date & time: 05/21/17  4098     History   Chief Complaint Chief Complaint  Patient presents with  . Emesis    HPI Lori Miles is a 22 y.o. female recurrent UTIs, urinary incontinence, autism, seizure-like disorder, GERD, sleep apnea who is brought in by her mother to the emergency department today complaining of nausea/vomiting, fever and possible recurrent UTI. Mother states the patient has been having recurrent UTIs in succession for the last several months. She has been on multiple antibiotics including IV Rocephin, Omnicef, Augmentin. She was most recently treated the first week of February with Augmentin however mother states that her symptoms Are improved. Patient continues to have urinary incontinence, dark, foul-smelling urine. She also has intermittent nausea and vomiting. Last night she vomited a dark brown and very malodorous emesis. She does have chronic constipation and takes MiraLAX at home. She is unsure when the patient's last bowel movement was. Last night she also had a fever greater than 100 and was tachycardic after working with physical therapy. Mother states that her blood pressure is typically systolic over 100. Mother states that she's been following up with [facility] and she is supposed to be scheduled to "have the inside of her bladder looked at". This procedure has not yet been scheduled. Per mother patient has not been complaining of abdominal pain, increased shortness of breath. No reported hematemesis, melena, hematochezia.  HPI  Past Medical History:  Diagnosis Date  . Allergy   . Asthma   . Autism    mom states no actual dx, but admits she has some symptoms  . Bronchitis   . Constipation   . Frequency-urgency syndrome     some incontinence ; wears pull-ups  . GERD (gastroesophageal reflux disease)   . Interstitial cystitis   . Mental deficiency    hx  of aggressive behavior,impaired speech   . Pneumonia   . PONV (postoperative nausea and vomiting)   . Seizure (HCC)    most recent sz " at the end of summer"-last sz years ago per mother  . Sleep apnea    needing to order CPAP    Patient Active Problem List   Diagnosis Date Noted  . Fever 04/17/2017  . Allergic rhinitis 04/03/2017  . Recurrent falls 03/21/2017  . Weakness 03/21/2017  . Venous insufficiency 03/21/2017  . Obstructive sleep apnea 09/30/2016  . Excessive daytime sleepiness 03/05/2016  . Gait disorder 03/22/2015  . 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 Cough variant asthma vs UACS/ vcd  03/15/2015  . Autism spectrum disorder 02/14/2015  . Nausea & vomiting 02/14/2015  . Obesity, morbid (HCC) 12/13/2014  . 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 Acanthosis nigricans 11/14/2014  . Toeing-in 08/29/2014  . 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 Recurrent urinary tract infection 08/01/2011  . Asthma 08/01/2011  . Chronic constipation 08/01/2011  . Contraception 08/01/2011    Past Surgical History:  Procedure Laterality Date  . CYSTOSCOPY    . CYSTOSCOPY WITH HYDRODISTENSION AND BIOPSY  04/14/2012   Procedure: CYSTOSCOPY/BIOPSY/HYDRODISTENSION;  Surgeon: Lindaann Slough, MD;  Location: [facility];  Service: Urology;;  instillation of marcaine and pyridium  . TONSILLECTOMY      OB History    No  data available       Home Medications    Prior to Admission medications   Medication Sig Start Date End Date Taking? Authorizing Provider  acetaminophen (TYLENOL) 325 MG tablet Take 2 tablets (650 mg total) by mouth every 6 (six) hours as needed for mild pain or moderate pain. 02/27/17   Everlene Farrieransie, William, PA-C  albuterol  (PROVENTIL) (2.5 MG/3ML) 0.083% nebulizer solution Inhale the contents of one vial in nebulizer every four to six hours as needed for cough or wheeze. 04/23/16   Kozlow, Alvira PhilipsEric J, MD  amoxicillin-clavulanate (AUGMENTIN) 875-125 MG tablet Take 1 tablet by mouth every 12 (twelve) hours. 04/22/17   Antony MaduraHumes, Kelly, PA-C  budesonide (PULMICORT) 1 MG/2ML nebulizer solution Take 2 mLs (1 mg total) by nebulization 2 (two) times daily. 03/05/17   Kozlow, Alvira PhilipsEric J, MD  cefdinir (OMNICEF) 300 MG capsule Take 1 capsule (300 mg total) by mouth 2 (two) times daily. 04/22/17   Antony MaduraHumes, Kelly, PA-C  cetirizine (ZYRTEC) 10 MG tablet TAKE 1 TABLET BY MOUTH DAILY F OR RUNNY NOSE OR ITCHING 05/12/17   Kozlow, Alvira PhilipsEric J, MD  cloNIDine (CATAPRES) 0.1 MG tablet take 1 and 1/2 tablet by mouth at bedtime Patient taking differently: Take 0.15 mg by mouth at bedtime.  02/12/17   Elveria RisingGoodpasture, Tina, NP  Cranberry 500 MG CAPS Take 500 mg by mouth daily. Reported on 09/07/2015    [provider]  DEPAKOTE 500 MG DR tablet Give 1 tablet in the morning and 2 tablets at night Patient taking differently: Take 500-1,000 mg by mouth See admin instructions. Give 1 tablet in the morning and 2 tablets at night 03/19/17   Elveria RisingGoodpasture, Tina, NP  fluconazole (DIFLUCAN) 100 MG tablet Take 1 tablet (100 mg total) by mouth daily. 04/30/17   Orvilla Cornwallenney, Rachelle A, CNM  fluconazole (DIFLUCAN) 200 MG tablet  03/31/17   [provider]  formoterol (PERFOROMIST) 20 MCG/2ML nebulizer solution Take 2 mLs (20 mcg total) by nebulization 2 (two) times daily. 05/08/17   Kozlow, Alvira PhilipsEric J, MD  hydrOXYzine (ATARAX/VISTARIL) 25 MG tablet Take 1 tablet (25 mg total) by mouth every 6 (six) hours. 03/29/17   Kirichenko, Tatyana, PA-C  hyoscyamine (LEVSIN SL) 0.125 MG SL tablet DISSOLVE 0.125mg  TABLET UNDER THE TONGUE 2 TIMES A DAY 08/08/14   [provider]  Lactobacillus (LACTINEX) PACK Mix 1/2 packet with soft food and give twice a day for 5 days. 04/22/17   Antony MaduraHumes,  Kelly, PA-C  levofloxacin (LEVAQUIN) 750 MG tablet Take 1 tablet (750 mg total) by mouth daily. 04/18/17   Myra RudeSchmitz, Jeremy E, MD  Levonorgestrel-Ethinyl Estradiol (SEASONIQUE) 0.15-0.03 &0.01 MG tablet Take 1 tablet by mouth daily.    [provider]  Melatonin 3 MG TABS Take 3 mg by mouth at bedtime. For insomnia    [provider]  montelukast (SINGULAIR) 10 MG tablet Take one tablet each evening to prevent cough or wheeze. Patient taking differently: Take 10 mg by mouth at bedtime.  02/25/17   Kozlow, Alvira PhilipsEric J, MD  olopatadine (PATANOL) 0.1 % ophthalmic solution Place 1 drop into both eyes 2 (two) times daily as needed for allergies. 04/03/17   Myrlene Brokerrawford, Elizabeth A, MD  ondansetron (ZOFRAN) 4 MG tablet Take 1 tablet (4 mg total) by mouth 2 (two) times daily. 04/15/17   Myrlene Brokerrawford, Elizabeth A, MD  ranitidine (ZANTAC) 150 MG capsule Take 1 capsule (150 mg total) 2 (two) times daily by mouth. 01/24/17   Pyrtle, Carie CaddyJay M, MD  senna (SENOKOT) 8.6  MG tablet Take 1 tablet by mouth daily.    [provider]  terconazole (TERAZOL 3) 0.8 % vaginal cream Use topically as needed for yeast. 04/30/17   Denney, Rachelle A, CNM  traZODone (DESYREL) 50 MG tablet Take 2 tablets (100 mg total) by mouth at bedtime. 11/07/16   Elveria Rising, NP    Family History Family History  Problem Relation Age of Onset  . Seizures Mother   . Seizures Sister        1 sister has  . Pancreatic cancer Sister   . Colon cancer Neg Hx   . Colon polyps Neg Hx   . Esophageal cancer Neg Hx   . Rectal cancer Neg Hx   . Stomach cancer Neg Hx     Social History Social History   Tobacco Use  . Smoking status: Never Smoker  . Smokeless tobacco: Never Used  Substance Use Topics  . Alcohol use: No    Alcohol/week: 0.0 oz  . Drug use: No     Allergies   Abilify [aripiprazole]; Seroquel [quetiapine fumarate]; Doxycycline; Amoxicillin-pot clavulanate; and Keppra [levetiracetam]   Review of  Systems Review of Systems  Unable to perform ROS: Patient nonverbal     Physical Exam Updated Vital Signs Ht 4\' 9"  (1.448 m)   Wt 85.7 kg (189 lb)   BMI 40.90 kg/m   Physical Exam  Constitutional: No distress.  HENT:  Head: Normocephalic and atraumatic.  Mouth/Throat: No oropharyngeal exudate.  Eyes: Conjunctivae and EOM are normal. Pupils are equal, round, and reactive to light. Right eye exhibits no discharge. Left eye exhibits no discharge. No scleral icterus.  Cardiovascular: Normal rate, regular rhythm, normal heart sounds and intact distal pulses. Exam reveals no gallop and no friction rub.  No murmur heard. Pulmonary/Chest: Effort normal and breath sounds normal. No respiratory distress. She has no wheezes. She has no rales. She exhibits no tenderness.  Abdominal: Soft. She exhibits no distension. There is no tenderness. There is no guarding.  Musculoskeletal: Normal range of motion. She exhibits no edema.  Neurological: She is alert.  Intellectual disability  Skin: Skin is warm and dry. No rash noted. She is not diaphoretic. No erythema. No pallor.  Psychiatric: She has a normal mood and affect. Her behavior is normal.  Nursing note and vitals reviewed.    ED Treatments / Results  Labs (all labs ordered are listed, but only abnormal results are displayed) Labs Reviewed - No data to display  EKG  EKG Interpretation None       Radiology No results found.  Procedures Procedures (including critical care time)  Medications Ordered in ED Medications - No data to display   Initial Impression / Assessment and Plan / ED Course  I have reviewed the triage vital signs and the nursing notes.  Pertinent labs & imaging results that were available during my care of the patient were reviewed by me and considered in my medical decision making (see chart for details).     22 year old female with a history of autism, sleep apnea, recurrent UTIs presents to the ED  today with ongoing nausea vomiting, fever, urinary incontinence and dark/foul-smelling urine. The symptoms ongoing for several months. She has had recurrent UTIs and has been on multiple antibiotics. Most recently she took Augmentin the second week in February but mother states her symptoms have improved. On arrival to ED she is hypotensive systolic blood pressure in the 90s, tachycardic. Clinically patient does not appear to be toxic however  she is gagging in the exam room without true emesis.  She meets SIRS criteria will initiate code sepsis and given IV fluids. Per chart review her last urine culture was sensitive to Rocephin. Given her ongoing constipation and questionable feculent emesis we'll also obtain KUB to evaluate for ileus or possible stool burden/fecal impaction which could be worsening her nausea and vomiting.  Upon further chart review, last UTI grew proteus. This can be associated with struvite renal stones. Will obtain CT renal stone study to see if this is etiology of recurrent UTIs.   No leukocytosis. KUB without excessive stool retention or impaction. UA actually does not show any evidence of infection at this time. Blood pressure has improved with some fluids. She received 1 dose of IV Rocephin here. Given her normal labs and now stable vital signs feel that she is safe to be discharged with close urology follow-up. Patient's mother states that she is scheduled for a cystoscopy. He soon. Per chart review she has had borderline soft blood pressures at majority of her inpatient as well as outpatient visits.  Patient was discussed with and seen by Dr. Eudelia Bunch who agrees with the treatment plan. Return precautions outlined in patient discharge instructions.    Final Clinical Impressions(s) / ED Diagnoses   Final diagnoses:  Dehydration  Lower urinary tract infectious disease  Vomiting in pediatric patient    ED Discharge Orders    None       Dub Mikes,  PA-C 05/21/17 1721

## 2017-05-22 ENCOUNTER — Telehealth: Payer: Self-pay | Admitting: Internal Medicine

## 2017-05-22 NOTE — Telephone Encounter (Unsigned)
Copied from CRM [id number]. Topic: Quick Communication - See Telephone Encounter >> May 22, 2017  2:33 PM Waymon AmatoBurton, Donna F wrote: CRM for notification. See Telephone encounter for: [facility] needs orders for nursing 2x 2 weeks   Best number [phone]  05/22/17.

## 2017-05-22 NOTE — Telephone Encounter (Signed)
LVM for [facility] to call back and let us know what exactly the verbal orders were for

## 2017-05-22 NOTE — Telephone Encounter (Signed)
Nursing for what? We need more information on what is requested to ensure it is appropriate.

## 2017-05-23 LAB — URINE CULTURE: Culture: 60000 — AB

## 2017-05-23 NOTE — Telephone Encounter (Signed)
Advised that Dr. Elberta Fortisamnitz does not recommend starting any medication at this time.  Mom states that RN/PT would like to discuss with us more.   Advised to have them call the office to discuss. Patient verbalized understanding and agreeable to plan.

## 2017-05-24 ENCOUNTER — Telehealth: Payer: Self-pay | Admitting: *Deleted

## 2017-05-24 NOTE — Telephone Encounter (Signed)
Post ED Visit - Positive Culture Follow-up  Culture report reviewed by antimicrobial stewardship pharmacist:  []  Enzo BiNathan Batchelder, Pharm.D. []  Celedonio MiyamotoJeremy Frens, Pharm.D., BCPS AQ-ID []  Garvin FilaMike Maccia, Pharm.D., BCPS []  Georgina PillionElizabeth Martin, Pharm.D., BCPS []  San MarMinh Pham, [address] Rainbow BoulevardPharm.D., BCPS, AAHIVP []  Estella HuskMichelle Turner, Pharm.D., BCPS, AAHIVP []  Lysle Pearlachel Rumbarger, PharmD, BCPS []  [name] DivineShannon Parkey, PharmD []  Pollyann SamplesAndy Johnston, PharmD, BCPS Sharin MonsEmily Sinclair, PharmD  Positive urine culture Treated with Amoxicillin-Pot Clavulanate, organism sensitive to the same and no further patient follow-up is required at this time.  Virl AxeRobertson, Cristan Hout [facility]LLCalley 05/24/2017, 11:35 AM

## 2017-05-26 LAB — CULTURE, BLOOD (ROUTINE X 2)
Culture: NO GROWTH
Culture: NO GROWTH
Special Requests: ADEQUATE
Special Requests: ADEQUATE

## 2017-05-30 ENCOUNTER — Encounter (HOSPITAL_BASED_OUTPATIENT_CLINIC_OR_DEPARTMENT_OTHER): Payer: Self-pay | Admitting: *Deleted

## 2017-05-30 ENCOUNTER — Other Ambulatory Visit: Payer: Self-pay | Admitting: Urology

## 2017-06-02 ENCOUNTER — Encounter (HOSPITAL_BASED_OUTPATIENT_CLINIC_OR_DEPARTMENT_OTHER): Payer: Self-pay | Admitting: *Deleted

## 2017-06-02 ENCOUNTER — Other Ambulatory Visit: Payer: Self-pay

## 2017-06-02 NOTE — Progress Notes (Addendum)
Spoke with mother Lori Miles due to patient with cognitive delays Npo after midnight arrive 830 am [facility] 06-03-17 meds to take albuter neb prn, pulmicort, perforomist, zantac, hyoscyamine, birth control pill, depakote, diflucan sip of water in am. Patient has sleep apnea no cpap yet per mother [facility] 04-06-17 chart/epic, ekg 05-21-17 chart/epic, chest xray 04-21-17 chart/epic cbc with dif, hepatic function panel , bmet urine pregnacy  all done 05-21-17 chart/epic, ecg 04-25-17 chart/epic, [facility] 11-05-16 chart/epic. Mother will help if patient needs help with transfers, patient usually transfers on own from wheelchair.   Driver is mother's aunt Lori Miles Mother Lori Miles signs consents for patient due to developmental delays.

## 2017-06-02 NOTE — Anesthesia Preprocedure Evaluation (Addendum)
Anesthesia Evaluation  Patient identified by MRN, date of birth, ID band Patient awake    Reviewed: Allergy & Precautions, NPO status , Patient's Chart, lab work & pertinent test results  History of Anesthesia Complications (+) PONV  Airway Mallampati: III  TM Distance: >3 FB Neck ROM: Full    Dental no notable dental hx.    Pulmonary asthma , sleep apnea ,    Pulmonary exam normal breath sounds clear to auscultation       Cardiovascular negative cardio ROS Normal cardiovascular exam Rhythm:Regular Rate:Normal     Neuro/Psych    GI/Hepatic Neg liver ROS, GERD  ,  Endo/Other    Renal/GU      Musculoskeletal   Abdominal (+) + obese,   Peds  Hematology   Anesthesia Other Findings   Reproductive/Obstetrics                            Lab Results  Component Value Date   WBC 8.0 05/21/2017   HGB 11.7 (L) 05/21/2017   HCT 35.9 (L) 05/21/2017   MCV 93.0 05/21/2017   PLT 218 05/21/2017    Anesthesia Physical Anesthesia Plan  ASA: II  Anesthesia Plan: MAC   Post-op Pain Management:    Induction: Intravenous  PONV Risk Score and Plan: 2 and Treatment may vary due to age or medical condition, Dexamethasone and Ondansetron  Airway Management Planned: Mask and Natural Airway  Additional Equipment:   Intra-op Plan:   Post-operative Plan:   Informed Consent: I have reviewed the patients History and Physical, chart, labs and discussed the procedure including the risks, benefits and alternatives for the proposed anesthesia with the patient or authorized representative who has indicated his/her understanding and acceptance.     Plan Discussed with: CRNA  Anesthesia Plan Comments:        Anesthesia Quick Evaluation

## 2017-06-03 ENCOUNTER — Ambulatory Visit (HOSPITAL_BASED_OUTPATIENT_CLINIC_OR_DEPARTMENT_OTHER)
Admission: RE | Admit: 2017-06-03 | Discharge: 2017-06-03 | Disposition: A | Discharge status: Home / Self Care | Payer: Medicare Other | Source: Ambulatory Visit | Attending: Urology | Admitting: Urology

## 2017-06-03 ENCOUNTER — Encounter (HOSPITAL_BASED_OUTPATIENT_CLINIC_OR_DEPARTMENT_OTHER)
Admission: RE | Disposition: A | Discharge status: Home / Self Care | Payer: Self-pay | Source: Ambulatory Visit | Attending: Urology

## 2017-06-03 ENCOUNTER — Other Ambulatory Visit: Payer: Self-pay

## 2017-06-03 ENCOUNTER — Ambulatory Visit (HOSPITAL_BASED_OUTPATIENT_CLINIC_OR_DEPARTMENT_OTHER): Payer: Medicare Other | Admitting: Anesthesiology

## 2017-06-03 ENCOUNTER — Telehealth: Payer: Self-pay | Admitting: Internal Medicine

## 2017-06-03 ENCOUNTER — Telehealth: Payer: Self-pay | Admitting: Cardiology

## 2017-06-03 ENCOUNTER — Encounter (HOSPITAL_BASED_OUTPATIENT_CLINIC_OR_DEPARTMENT_OTHER): Payer: Self-pay | Admitting: *Deleted

## 2017-06-03 DIAGNOSIS — Z6841 Body Mass Index (BMI) 40.0 and over, adult: Secondary | ICD-10-CM | POA: Diagnosis not present

## 2017-06-03 DIAGNOSIS — Z888 Allergy status to other drugs, medicaments and biological substances status: Secondary | ICD-10-CM | POA: Insufficient documentation

## 2017-06-03 DIAGNOSIS — E669 Obesity, unspecified: Secondary | ICD-10-CM | POA: Diagnosis not present

## 2017-06-03 DIAGNOSIS — F840 Autistic disorder: Secondary | ICD-10-CM | POA: Diagnosis not present

## 2017-06-03 DIAGNOSIS — K219 Gastro-esophageal reflux disease without esophagitis: Secondary | ICD-10-CM | POA: Insufficient documentation

## 2017-06-03 DIAGNOSIS — G40409 Other generalized epilepsy and epileptic syndromes, not intractable, without status epilepticus: Secondary | ICD-10-CM | POA: Insufficient documentation

## 2017-06-03 DIAGNOSIS — R350 Frequency of micturition: Secondary | ICD-10-CM | POA: Insufficient documentation

## 2017-06-03 DIAGNOSIS — M109 Gout, unspecified: Secondary | ICD-10-CM | POA: Diagnosis not present

## 2017-06-03 DIAGNOSIS — K5909 Other constipation: Secondary | ICD-10-CM | POA: Diagnosis not present

## 2017-06-03 DIAGNOSIS — N3941 Urge incontinence: Secondary | ICD-10-CM | POA: Diagnosis not present

## 2017-06-03 DIAGNOSIS — R6250 Unspecified lack of expected normal physiological development in childhood: Secondary | ICD-10-CM | POA: Diagnosis not present

## 2017-06-03 DIAGNOSIS — Z881 Allergy status to other antibiotic agents status: Secondary | ICD-10-CM | POA: Insufficient documentation

## 2017-06-03 DIAGNOSIS — J45909 Unspecified asthma, uncomplicated: Secondary | ICD-10-CM | POA: Insufficient documentation

## 2017-06-03 DIAGNOSIS — G4733 Obstructive sleep apnea (adult) (pediatric): Secondary | ICD-10-CM | POA: Insufficient documentation

## 2017-06-03 DIAGNOSIS — N3289 Other specified disorders of bladder: Secondary | ICD-10-CM | POA: Diagnosis not present

## 2017-06-03 DIAGNOSIS — Z79899 Other long term (current) drug therapy: Secondary | ICD-10-CM | POA: Diagnosis not present

## 2017-06-03 DIAGNOSIS — Z880 Allergy status to penicillin: Secondary | ICD-10-CM | POA: Diagnosis not present

## 2017-06-03 HISTORY — DX: Obstructive sleep apnea (adult) (pediatric): G47.33

## 2017-06-03 HISTORY — DX: Unspecified abnormalities of gait and mobility: R26.9

## 2017-06-03 HISTORY — DX: Personal history of other diseases of the circulatory system: Z86.79

## 2017-06-03 HISTORY — DX: Gout, unspecified: M10.9

## 2017-06-03 HISTORY — DX: Developmental disorder of scholastic skills, unspecified: F81.9

## 2017-06-03 HISTORY — DX: Conduct disorder, unspecified: F91.9

## 2017-06-03 HISTORY — DX: Moderate intellectual disabilities: F71

## 2017-06-03 HISTORY — DX: Family history of other specified conditions: Z84.89

## 2017-06-03 HISTORY — DX: Personal history of urinary (tract) infections: Z87.440

## 2017-06-03 HISTORY — DX: Bacteriuria: R82.71

## 2017-06-03 HISTORY — DX: Other constipation: K59.09

## 2017-06-03 HISTORY — DX: Unspecified urinary incontinence: R32

## 2017-06-03 HISTORY — DX: Localization-related (focal) (partial) symptomatic epilepsy and epileptic syndromes with complex partial seizures, not intractable, without status epilepticus: G40.209

## 2017-06-03 HISTORY — DX: Personal history of other diseases of the respiratory system: Z87.09

## 2017-06-03 HISTORY — PX: CYSTOSCOPY: SHX5120

## 2017-06-03 HISTORY — DX: Generalized idiopathic epilepsy and epileptic syndromes, not intractable, without status epilepticus: G40.309

## 2017-06-03 SURGERY — CYSTOSCOPY
Anesthesia: Monitor Anesthesia Care | Site: Bladder

## 2017-06-03 MED ORDER — FENTANYL CITRATE (PF) 250 MCG/5ML IJ SOLN
INTRAMUSCULAR | Status: DC | PRN
  Administered 2017-06-03 (×2): 25 ug via INTRAVENOUS

## 2017-06-03 MED ORDER — MIDAZOLAM HCL 2 MG/2ML IJ SOLN
INTRAMUSCULAR | Status: AC
  Filled 2017-06-03: qty 2

## 2017-06-03 MED ORDER — FENTANYL CITRATE (PF) 100 MCG/2ML IJ SOLN
INTRAMUSCULAR | Status: AC
  Filled 2017-06-03: qty 2

## 2017-06-03 MED ORDER — MEPERIDINE HCL 25 MG/ML IJ SOLN
6.2500 mg | INTRAMUSCULAR | Status: DC | PRN
  Filled 2017-06-03: qty 1

## 2017-06-03 MED ORDER — HYDROMORPHONE HCL 1 MG/ML IJ SOLN
0.2500 mg | INTRAMUSCULAR | Status: DC | PRN
  Filled 2017-06-03: qty 0.5

## 2017-06-03 MED ORDER — ACETAMINOPHEN 500 MG PO TABS
1000.0000 mg | ORAL_TABLET | Freq: Once | ORAL | Status: AC
  Administered 2017-06-03: 1000 mg via ORAL
  Filled 2017-06-03: qty 2

## 2017-06-03 MED ORDER — MIDAZOLAM HCL 5 MG/5ML IJ SOLN
INTRAMUSCULAR | Status: DC | PRN
  Administered 2017-06-03: 2 mg via INTRAVENOUS

## 2017-06-03 MED ORDER — PROPOFOL 500 MG/50ML IV EMUL
INTRAVENOUS | Status: AC
  Filled 2017-06-03: qty 50

## 2017-06-03 MED ORDER — CEFAZOLIN SODIUM-DEXTROSE 2-4 GM/100ML-% IV SOLN
2.0000 g | Freq: Once | INTRAVENOUS | Status: AC
  Administered 2017-06-03: 2 g via INTRAVENOUS
  Filled 2017-06-03: qty 100

## 2017-06-03 MED ORDER — PROPOFOL 10 MG/ML IV BOLUS
INTRAVENOUS | Status: DC | PRN
  Administered 2017-06-03: 30 mg via INTRAVENOUS
  Administered 2017-06-03: 20 mg via INTRAVENOUS
  Administered 2017-06-03: 50 mg via INTRAVENOUS

## 2017-06-03 MED ORDER — LACTATED RINGERS IV SOLN
INTRAVENOUS | Status: DC
  Administered 2017-06-03: 10:00:00 via INTRAVENOUS
  Filled 2017-06-03: qty 1000

## 2017-06-03 MED ORDER — GABAPENTIN 300 MG PO CAPS
300.0000 mg | ORAL_CAPSULE | Freq: Once | ORAL | Status: AC
  Administered 2017-06-03: 300 mg via ORAL
  Filled 2017-06-03: qty 1

## 2017-06-03 MED ORDER — ACETAMINOPHEN 500 MG PO TABS
1000.0000 mg | ORAL_TABLET | Freq: Once | ORAL | Status: DC
  Filled 2017-06-03: qty 2

## 2017-06-03 MED ORDER — PROPOFOL 500 MG/50ML IV EMUL
INTRAVENOUS | Status: DC | PRN
  Administered 2017-06-03: 75 ug/kg/min via INTRAVENOUS

## 2017-06-03 MED ORDER — CEFAZOLIN SODIUM-DEXTROSE 2-4 GM/100ML-% IV SOLN
INTRAVENOUS | Status: AC
  Filled 2017-06-03: qty 100

## 2017-06-03 MED ORDER — HYDROCODONE-ACETAMINOPHEN 7.5-325 MG PO TABS
1.0000 | ORAL_TABLET | Freq: Once | ORAL | Status: DC | PRN
  Filled 2017-06-03: qty 1

## 2017-06-03 MED ORDER — GABAPENTIN 300 MG PO CAPS
300.0000 mg | ORAL_CAPSULE | Freq: Once | ORAL | Status: DC
  Filled 2017-06-03: qty 1

## 2017-06-03 MED ORDER — ACETAMINOPHEN 500 MG PO TABS
ORAL_TABLET | ORAL | Status: AC
  Filled 2017-06-03: qty 2

## 2017-06-03 MED ORDER — GABAPENTIN 300 MG PO CAPS
ORAL_CAPSULE | ORAL | Status: AC
  Filled 2017-06-03: qty 1

## 2017-06-03 MED ORDER — PROMETHAZINE HCL 25 MG/ML IJ SOLN
6.2500 mg | INTRAMUSCULAR | Status: DC | PRN
  Filled 2017-06-03: qty 1

## 2017-06-03 MED ORDER — LIDOCAINE 2% (20 MG/ML) 5 ML SYRINGE
INTRAMUSCULAR | Status: DC | PRN
  Administered 2017-06-03: 80 mg via INTRAVENOUS

## 2017-06-03 MED ORDER — ACETAMINOPHEN 10 MG/ML IV SOLN
1000.0000 mg | Freq: Once | INTRAVENOUS | Status: DC | PRN
Start: 2017-06-03 — End: 2017-06-03
  Filled 2017-06-03: qty 100

## 2017-06-03 MED ORDER — LIDOCAINE HCL 2 % EX GEL
CUTANEOUS | Status: DC | PRN
  Administered 2017-06-03: 1 via URETHRAL

## 2017-06-03 MED ORDER — LIDOCAINE 2% (20 MG/ML) 5 ML SYRINGE
INTRAMUSCULAR | Status: AC
  Filled 2017-06-03: qty 5

## 2017-06-03 SURGICAL SUPPLY — 16 items
BAG DRAIN URO-CYSTO SKYTR STRL (DRAIN) ×3 IMPLANT
BAG DRN UROCATH (DRAIN) ×1
CLOTH BEACON ORANGE TIMEOUT ST (SAFETY) ×3 IMPLANT
ELECT REM PT RETURN 9FT ADLT (ELECTROSURGICAL)
ELECTRODE REM PT RTRN 9FT ADLT (ELECTROSURGICAL) ×1 IMPLANT
GLOVE BIO SURGEON STRL SZ7.5 (GLOVE) ×3 IMPLANT
GOWN STRL REUS W/ TWL XL LVL3 (GOWN DISPOSABLE) ×1 IMPLANT
GOWN STRL REUS W/TWL XL LVL3 (GOWN DISPOSABLE) ×3
KIT TURNOVER CYSTO (KITS) ×3 IMPLANT
MANIFOLD NEPTUNE II (INSTRUMENTS) IMPLANT
NEEDLE HYPO 22GX1.5 SAFETY (NEEDLE) IMPLANT
NS IRRIG 500ML POUR BTL (IV SOLUTION) IMPLANT
PACK CYSTO (CUSTOM PROCEDURE TRAY) ×3 IMPLANT
TUBE CONNECTING 12'X1/4 (SUCTIONS)
TUBE CONNECTING 12X1/4 (SUCTIONS) IMPLANT
WATER STERILE IRR 3000ML UROMA (IV SOLUTION) ×3 IMPLANT

## 2017-06-03 NOTE — Telephone Encounter (Signed)
New Message  Pt mom states she has questions regarding surgery. Please call

## 2017-06-03 NOTE — Anesthesia Postprocedure Evaluation (Signed)
Anesthesia Post Note  Patient: Lori Miles  Procedure(s) Performed: CYSTOSCOPY WITH EXAM UNDER ANESTHESIA (N/A Bladder)     Patient location during evaluation: PACU Anesthesia Type: MAC Level of consciousness: awake and alert Pain management: pain level controlled Vital Signs Assessment: post-procedure vital signs reviewed and stable Respiratory status: spontaneous breathing, nonlabored ventilation, respiratory function stable and patient connected to nasal cannula oxygen Cardiovascular status: stable and blood pressure returned to baseline Postop Assessment: no apparent nausea or vomiting Anesthetic complications: no    Last Vitals:  Vitals:   06/03/17 1145 06/03/17 1200  BP: 108/73 112/77  Pulse: 94 86  Resp: (!) 21 18  Temp:    SpO2: 100% 100%    Last Pain:  Vitals:   06/03/17 0624  TempSrc: Oral                 Barnet Glasgow

## 2017-06-03 NOTE — H&P (Addendum)
H&P  Chief Complaint: Incontinence, bacteriuria  History of Present Illness: Lori Miles is a 22 year old African-American female with a history of generalized convulsive and partial epilepsy, intellectual disabilities, problems with sleep and incontinence.  As a child she has a urologic history of vesicoureteral reflux and underwent Deflux.  As she is gotten older she has had episodes of frequency, urgency and urge incontinence.  On questioning, it sounds like Lori Miles has always been in a diaper or pull-up, but her mom feels like over the past year Lori Miles's frequency had improved such that she was not in a diaper or pull-up and was staying dry "day and night".  More recently Lori Miles's voiding again in a diaper or pull-up and her mom associates this with urinary tract infection.  Her mom reports she has been voids about every hour to and usually in a pull-up.  Lori Miles will head off to the bathroom or point to a picture but she also simply voids into the diaper.  Her mom admits her seizure activity, and intellectual abilities have declined over the past few years such that Lori Miles's communication and speech has declined and she is not able to tie her shoes anymore.  Her mom also associates urinary tract infection with fever and tachycardia.  She had a recent episode of fever and tachycardia and went to emergency May 21, 2017.  She has been had a temperature of 98.8, her blood pressure was soft and heart rate 115.  Her white count was 8.  CT scan of the abdomen and pelvis was normal.  There was no perivesical or perirenal stranding or inflammation.  I restarted Lori Miles on Augmentin yesterday simply for the procedure.  Lori Miles continues to have a higher heart rate with a heart rate of 97.  Mom also said at the tail end of the last antibiotics Lori Miles has a fast heart rate at home and home health checks it.  We discussed the tachycardia is not related to a urinary tract infection. Mom said Lori Miles has not had fever.  Lori Miles looks like her normal happy self today.  She is smiling and looking at the iPad.  She asked for something to drink.  Past Medical History:  Diagnosis Date  . Asthma   . Autism    mom states no actual dx, but admits she has some symptoms  . Bacteriuria   . Chronic constipation   . Cognitive developmental delay   . Disruptive behavior disorder   . Family history of adverse reaction to anesthesia    mother had itching after anesthesia one time  . Frequency-urgency syndrome   . Gait disorder    ORGANIC PER NERUOLGIST NOTE (DR HICKLING)  . 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 GERD (gastroesophageal reflux disease)   . Gout   . H/O sinus tachycardia   . History of acute respiratory failure 02/14/2015   SECONDARY TO CAP  . History of recurrent UTIs   . Interstitial cystitis   . Moderate intellectual disability   . OSA (obstructive sleep apnea)    no machine yet   . Partial epilepsy with impairment of consciousness (HCC)    FOLLOWED BY DR HICKLING  . Pneumonia 2018 last time  . PONV (postoperative nausea and vomiting)   . Seizure (HCC)    most recent sz " at the end of summer"-last sz years ago per mother  . Urinary incontinence    Past Surgical History:  Procedure Laterality Date  . CYSTOSCOPY WITH HYDRODISTENSION AND BIOPSY  04/14/2012   Procedure: CYSTOSCOPY/BIOPSY/HYDRODISTENSION;  Surgeon: Lindaann SloughMarc-Henry Nesi, MD;  [facility] [place];  Service: Urology;;  instillation of marcaine and pyridium  . EUA/  PAP SMEAR/  BREAST EXAM  03-12-2016   [facility]  . EUA/ VAGINOSCOPY/ COLPOSCOPY FO THE HYMENAL RING  10-04-2009    [facility]  . TONSILLECTOMY      Home Medications:  Medications Prior to Admission  Medication Sig Dispense Refill Last Dose  . acetaminophen (TYLENOL) 325 MG tablet Take 2 tablets (650 mg total) by mouth every 6 (six) hours as needed for mild pain or moderate pain. 60 tablet 0 Past Week at  Unknown time  . albuterol (PROVENTIL) (2.5 MG/3ML) 0.083% nebulizer solution Inhale the contents of one vial in nebulizer every four to six hours as needed for cough or wheeze. 120 mL 1 06/03/2017 at Unknown time  . amoxicillin-clavulanate (AUGMENTIN) 875-125 MG tablet Take 1 tablet by mouth every 12 (twelve) hours. (Patient taking differently: Take 1 tablet by mouth every 12 (twelve) hours. Started 06-02-17) 14 tablet 0 06/02/2017 at Unknown time  . budesonide (PULMICORT) 1 MG/2ML nebulizer solution Take 2 mLs (1 mg total) by nebulization 2 (two) times daily. 120 mL 5 06/03/2017 at Unknown time  . cetirizine (ZYRTEC) 10 MG tablet TAKE 1 TABLET BY MOUTH DAILY F OR RUNNY NOSE OR ITCHING 30 tablet 3 06/02/2017 at Unknown time  . cloNIDine (CATAPRES) 0.1 MG tablet take 1 and 1/2 tablet by mouth at bedtime (Patient taking differently: Take 0.15 mg by mouth at bedtime. ) 45 tablet 2 06/02/2017 at Unknown time  . DEPAKOTE 500 MG DR tablet Give 1 tablet in the morning and 2 tablets at night (Patient taking differently: Take 500-1,000 mg by mouth See admin instructions. Give 1 tablet in the morning and 2 tablets at night) 90 tablet 5 06/03/2017 at Unknown time  . fluconazole (DIFLUCAN) 100 MG tablet Take 1 tablet (100 mg total) by mouth daily. 30 tablet 3 06/03/2017 at Unknown time  . formoterol (PERFOROMIST) 20 MCG/2ML nebulizer solution Take 2 mLs (20 mcg total) by nebulization 2 (two) times daily. 120 mL 5 06/03/2017 at Unknown time  . hyoscyamine (LEVSIN SL) 0.125 MG SL tablet Place 0.125 mg under the tongue every 4 (four) hours as needed.     . Levonorgestrel-Ethinyl Estradiol (SEASONIQUE) 0.15-0.03 &0.01 MG tablet Take 1 tablet by mouth daily.   06/03/2017 at Unknown time  . Melatonin 3 MG TABS Take 3 mg by mouth at bedtime. For insomnia   06/02/2017 at Unknown time  . montelukast (SINGULAIR) 10 MG tablet Take one tablet each evening to prevent cough or wheeze. (Patient taking differently: Take 10 mg by mouth. Takes  at 300 pm) 30 tablet 2 06/02/2017 at Unknown time  . ondansetron (ZOFRAN ODT) 4 MG disintegrating tablet Take 1 tablet (4 mg total) by mouth every 8 (eight) hours as needed for nausea or vomiting. 10 tablet 0 06/03/2017 at Unknown time  . polyethylene glycol (MIRALAX / GLYCOLAX) packet Take 17 g by mouth 2 (two) times daily.   06/02/2017 at Unknown time  . ranitidine (ZANTAC) 150 MG capsule Take 1 capsule (150 mg total) 2 (two) times daily by mouth. 180 capsule 3 06/03/2017 at Unknown time  . senna (SENOKOT) 8.6 MG tablet Take 1 tablet by mouth at bedtime.    06/02/2017 at Unknown time  . traZODone (DESYREL) 50 MG tablet Take 2 tablets (100 mg total) by mouth at bedtime. 62 tablet 5  06/02/2017 at Unknown time  . cefdinir (OMNICEF) 300 MG capsule Take 1 capsule (300 mg total) by mouth 2 (two) times daily. (Patient taking differently: Take 300 mg by mouth 2 (two) times daily. Pt did not take) 14 capsule 0 Unknown at Unknown time   Allergies:  Allergies  Allergen Reactions  . Abilify [Aripiprazole] Swelling and Palpitations  . Seroquel [Quetiapine Fumarate] Palpitations  . Doxycycline Other (See Comments)    Stomach cramps  . Amoxicillin-Pot Clavulanate Hives  . Keppra [Levetiracetam] Other (See Comments)    Aggressive behavior     Family History  Problem Relation Age of Onset  . Seizures Mother   . Seizures Sister        1 sister has  . Pancreatic cancer Sister   . Colon cancer Neg Hx   . Colon polyps Neg Hx   . Esophageal cancer Neg Hx   . Rectal cancer Neg Hx   . Stomach cancer Neg Hx    Social History:  reports that  has never smoked. she has never used smokeless tobacco. She reports that she does not drink alcohol or use drugs.  ROS: A complete review of systems was performed.  All systems are negative except for pertinent findings as noted. Review of Systems  All other systems reviewed and are negative.    Physical Exam:  Vital signs in last 24 hours: Temp:  [98 F (36.7 C)]  98 F (36.7 C) (03/19 0624) Pulse Rate:  [97] 97 (03/19 0624) Resp:  [22] 22 (03/19 0624) BP: (110)/(66) 110/66 (03/19 0624) SpO2:  [100 %] 100 % (03/19 0624) Weight:  [83.9 kg (185 lb)-84.1 kg (185 lb 8 oz)] 84.1 kg (185 lb 8 oz) (03/19 4098) General:  Alert and oriented, No acute distress HEENT: Normocephalic, atraumatic Cardiovascular: Regular rate and rhythm Lungs: Regular rate and effort Abdomen: Soft, nontender, nondistended, no abdominal masses Back: No CVA tenderness Extremities: No edema Neurologic: Grossly intact  Laboratory Data:  No results found for this or any previous visit (from the past 24 hour(s)). No results found for this or any previous visit (from the past 240 hour(s)). Creatinine: No results for input(s): CREATININE in the last 168 hours.  Impression/Assessment/plan:  Frequency, incontinence- I suspect some of the incontinence is functional related to Lota's intellectual disabilities and there may a component of overactive bladder which is common in patients with seizure disorder or other neurologic deficits. Certainly fever or tachycardia needs to be evaluated, but even in recent emergency Lori Miles had no fever or white count. Also, even at the tail end of her last antibiotics her mom reports she continued to have tachycardia on vital signs at home with home health. Recent imaging normal.  Lori Miles's cultures are likely to be positive as they are voided specimens and her skin will be colonized from pull up use.  She is brought today for exam under anesthesia, measure post void residual, cystoscopy and measure a bladder capacity.  Hopefully this will shed some light on her situation.  Lori Miles 06/03/2017, 10:47 AM

## 2017-06-03 NOTE — Op Note (Signed)
Preoperative diagnosis: Frequency, urgency, incontinence Postoperative diagnosis: Frequency, urgency, incontinence, small capacity unstable bladder with high pressure contractions   Procedure: Exam under anesthesia, cystoscopy  Surgeon: Mena GoesEskridge  Anesthesia: General   Findings: On exam under anesthesia the vulva and vagina appeared normal.  The bladder was palpably normal as well as the urethra.  There was no evidence of urethral diverticulum.  On cystoscopy the urethra was normal without stricture.  The trigone and ureteral orifice ease were in the normal orthotopic position.  There was evidence of prior reflux with a mound under the mucosa under each ureteral orifice.  There was clear efflux from each ureteral orifice.  The bladder was small capacity and trabeculated.  Post void less than 50 mL (pt voided about an hour before). She held only about 150 mL and became noticeably uncomfortable, a bladder contraction developed and she voided not only around the scope but forced fluid back up the tubing.  I measured again and got the same result and capacity about 150 mL.  Description of procedure: After consent was obtained the patient brought to the operating room.  After adequate anesthesia she was placed in lithotomy position and prepped and draped in the usual sterile fashion.  Of note, even while sedated with no stimulation her heart rate was around 95 and with any stimulation up to 115.  A small speculum was used and the ureteral orifice was visualized.  The 3722 [place] scope did not seem to engage the urethra easily and therefore I switched to the 15 French flexible cystoscope was passed easily.  The post void was measured.  The bladder was carefully inspected.  The ureteral orifice ease were carefully inspected.  I measured the bladder capacity twice.  The bladder was drained and the scope removed.  Some lidocaine jelly was instilled per urethra.  She was awakened and taken to the recovery room in  stable condition.  Complications: None  Blood loss: Minimal  Specimens: None  Drains: None

## 2017-06-03 NOTE — Transfer of Care (Signed)
Immediate Anesthesia Transfer of Care Note  Patient: Lori Miles  Procedure(s) Performed: CYSTOSCOPY WITH EXAM UNDER ANESTHESIA (N/A Bladder)  Patient Location: PACU  Anesthesia Type:MAC  Level of Consciousness: sedated and responds to stimulation  Airway & Oxygen Therapy: Patient Spontanous Breathing and Patient connected to nasal cannula oxygen  Post-op Assessment: Report given to RN  Post vital signs: Reviewed and stable  Last Vitals: 99/67, 99, 22, 100% Vitals:   06/03/17 0624 06/03/17 1137  BP: 110/66   Pulse: 97   Resp: (!) 22   Temp: 36.7 C (P) 36.7 C  SpO2: 100%     Last Pain:  Vitals:   06/03/17 0624  TempSrc: Oral         Complications: No apparent anesthesia complications

## 2017-06-03 NOTE — Anesthesia Procedure Notes (Signed)
Procedure Name: MAC Date/Time: 06/03/2017 10:51 AM Performed by: Bonney Aid, CRNA Pre-anesthesia Checklist: Timeout performed, Patient identified, Emergency Drugs available, Suction available and Patient being monitored Patient Re-evaluated:Patient Re-evaluated prior to induction Oxygen Delivery Method: Nasal cannula Placement Confirmation: positive ETCO2

## 2017-06-03 NOTE — Discharge Instructions (Signed)
Postoperative Anesthesia Instructions-Pediatric  Activity: Your child should rest for the remainder of the day. A responsible individual must stay with your child for 24 hours.  Meals: Your child should start with liquids and light foods such as gelatin or soup unless otherwise instructed by the physician. Progress to regular foods as tolerated. Avoid spicy, greasy, and heavy foods. If nausea and/or vomiting occur, drink only clear liquids such as apple juice or Pedialyte until the nausea and/or vomiting subsides. Call your physician if vomiting continues.  Special Instructions/Symptoms: Your child may be drowsy for the rest of the day, although some children experience some hyperactivity a few hours after the surgery. Your child may also experience some irritability or crying episodes due to the operative procedure and/or anesthesia. Your child's throat may feel dry or sore from the anesthesia or the breathing tube placed in the throat during surgery. Use throat lozenges, sprays, or ice chips if needed. Cystoscopy, Care After Refer to this sheet in the next few weeks. These instructions provide you with information about caring for yourself after your procedure. Your health care provider may also give you more specific instructions. Your treatment has been planned according to current medical practices, but problems sometimes occur. Call your health care provider if you have any problems or questions after your procedure. What can I expect after the procedure? After the procedure, it is common to have:  Mild pain when you urinate. Pain should stop within a few minutes after you urinate. This may last for up to 1 week.  A small amount of blood in your urine for several days.  Feeling like you need to urinate but producing only a small amount of urine.  Follow these instructions at home:  Medicines  Take over-the-counter and prescription medicines only as told by your health care  provider.  If you were prescribed an antibiotic medicine, take it as told by your health care provider. Do not stop taking the antibiotic even if you start to feel better. General instructions   Return to your normal activities as told by your health care provider. Ask your health care provider what activities are safe for you.  Do not drive for 24 hours if you received a sedative.  Watch for any blood in your urine. If the amount of blood in your urine increases, call your health care provider.  Follow instructions from your health care provider about eating or drinking restrictions.  If a tissue sample was removed for testing (biopsy) during your procedure, it is your responsibility to get your test results. Ask your health care provider or the department performing the test when your results will be ready.  Drink enough fluid to keep your urine clear or pale yellow.  Keep all follow-up visits as told by your health care provider. This is important. Contact a health care provider if:  You have pain that gets worse or does not get better with medicine, especially pain when you urinate.  You have difficulty urinating. Get help right away if:  You have more blood in your urine.  You have blood clots in your urine.  You have abdominal pain.  You have a fever or chills.  You are unable to urinate. This information is not intended to replace advice given to you by your health care provider. Make sure you discuss any questions you have with your health care provider. Document Released: 09/21/2004 Document Revised: 08/10/2015 Document Reviewed: 01/19/2015 Elsevier Interactive Patient Education  [employer]2018 [employer]

## 2017-06-03 NOTE — Telephone Encounter (Signed)
Copied from CRM [id number]. Topic: Quick Communication - See Telephone Encounter >> Jun 03, 2017  4:53 PM Arlyss Gandyichardson, Miron Marxen N, NT wrote: CRM for notification. See Telephone encounter for: Pts mom calling and states that pt has been wetting herself more and that she believes Dr. Mena GoesEskridge is not listening to them. They are going to discuss with Dr. Okey Duprerawford tomorrow to see if they can get a new dr. She said she knows her daughter has a bacteria infection.   06/03/17.

## 2017-06-04 ENCOUNTER — Ambulatory Visit (INDEPENDENT_AMBULATORY_CARE_PROVIDER_SITE_OTHER): Payer: Medicare Other | Admitting: Internal Medicine

## 2017-06-04 ENCOUNTER — Telehealth: Payer: Self-pay | Admitting: Internal Medicine

## 2017-06-04 ENCOUNTER — Encounter (HOSPITAL_BASED_OUTPATIENT_CLINIC_OR_DEPARTMENT_OTHER): Payer: Self-pay | Admitting: Urology

## 2017-06-04 VITALS — BP 122/80 | HR 97 | Temp 98.0°F | Ht <= 58 in | Wt 199.0 lb

## 2017-06-04 DIAGNOSIS — N3941 Urge incontinence: Secondary | ICD-10-CM

## 2017-06-04 DIAGNOSIS — N390 Urinary tract infection, site not specified: Secondary | ICD-10-CM

## 2017-06-04 MED ORDER — ONDANSETRON 4 MG PO TBDP: 4.0000 mg | ORAL_TABLET | Freq: Three times a day (TID) | ORAL | 0 refills | Status: DC | PRN

## 2017-06-04 NOTE — Patient Instructions (Signed)
We will get you in with the urologist for the second opinion.   We will not start back any fluid pills.   As we talked about the bladder is just not able to hold much urine so the leaking will likely not be changed with infection or not.

## 2017-06-04 NOTE — Telephone Encounter (Signed)
[name] came back by the front desk after pt's appt this morning with Dr Okey Duprerawford inquiring about the procedure note from yesterday (Dr Mena GoesEskridge).  [name] stated Dr Okey Duprerawford was going to print it off for her.

## 2017-06-04 NOTE — Telephone Encounter (Signed)
FYI

## 2017-06-04 NOTE — Assessment & Plan Note (Signed)
Her mother is very convinced that she is having infections although recent exam with urology suggests just urinary incontinence and poor bladder capacity. She got leaking with only 150 cc urine during cystoscopy. She has not been able to tolerate oxybutynin or myrbetriq with side effects. Referral to second opinion urology per patient's mother request.

## 2017-06-04 NOTE — Telephone Encounter (Signed)
Mom reports that hospital doctor was going to send a note to Dr. Elberta Fortis about dtr's HR being elevated during procedure yesterday (cystoscopy).  Reviewed OP report. Informed mother that I would have Dr. Elberta Fortis review and call her back today/tomorrow with plan.  Discussed if pt would wear holter monitor to evaluate tachycardia further and mom reports pt should wear with no issues.  She understands I will call her soon with recommendation/s.

## 2017-06-04 NOTE — Progress Notes (Signed)
   Subjective:    Patient ID: Lori Miles, female    DOB: 1995/08/07, 22 y.o.   MRN: [id number]  HPI The patient is a 22 YO female coming in with mother for concerns. She does not have mental capacity and is involved in visit in limited way. Her mother is concerned that her daughter has a uti and no one is doing anything about it. She has had cytoscopy and exam under aneasthia yesterday and had severe urge incontinence and spasm of bladder with very small capacity. She is still finishing some antibiotics but she feels that the urologist is not listening. Her daughter does have chronic incontinence and leaking but her mother thinks it is worsening lately. She denies fevers or chills. She denies SOB.   Review of Systems  Unable to perform ROS: Patient nonverbal      Objective:   Physical Exam  Constitutional: She appears well-developed and well-nourished.  HENT:  Head: Normocephalic and atraumatic.  Eyes: EOM are normal.  Neck: Normal range of motion.  Cardiovascular: Normal rate and regular rhythm.  Pulmonary/Chest: Effort normal and breath sounds normal. No respiratory distress. She has no wheezes. She has no rales.  Abdominal: Soft. Bowel sounds are normal. She exhibits no distension. There is no tenderness. There is no rebound.  Musculoskeletal: She exhibits no edema.  Neurological: She is alert. Coordination normal.  Follows 1 step commands  Skin: Skin is warm and dry.   Vitals:   06/04/17 0958  BP: 122/80  Pulse: 97  Temp: 98 F (36.7 C)  TempSrc: Oral  SpO2: 99%  Weight: 199 lb (90.3 kg)  Height: 4' 8.5" (1.435 m)      Assessment & Plan:

## 2017-06-04 NOTE — Telephone Encounter (Signed)
FYI, We were not sure if we were able to print these off for patient since they were notes from [facility] and told patient she would have to wait for Dr. Okey Duprerawford to get back to the office before there was a definitive answer

## 2017-06-05 NOTE — Telephone Encounter (Signed)
Mom tells me that her and PCP feel that elevated HRs are r/t recurrent UTI issues. She further explains that she is going to speak with Dr. Yetta FlockHodges, urologist, next week to discuss pt's case further. She wants to wait for holter monitoring until speaking w/ Dr. Yetta FlockHodges. If mom feels like she is ready to proceed with holter monitoring she will call the office back and we will arrange.  She appreciates me speaking with her at length about her concerns.

## 2017-06-09 ENCOUNTER — Other Ambulatory Visit (INDEPENDENT_AMBULATORY_CARE_PROVIDER_SITE_OTHER): Payer: Self-pay | Admitting: Family

## 2017-06-09 ENCOUNTER — Other Ambulatory Visit: Payer: Self-pay | Admitting: Allergy and Immunology

## 2017-06-09 DIAGNOSIS — G4700 Insomnia, unspecified: Secondary | ICD-10-CM

## 2017-06-09 NOTE — Telephone Encounter (Signed)
Fine to print op note from the hospital.

## 2017-06-10 ENCOUNTER — Telehealth: Payer: Self-pay | Admitting: Internal Medicine

## 2017-06-10 ENCOUNTER — Telehealth: Payer: Self-pay | Admitting: Pulmonary Disease

## 2017-06-10 DIAGNOSIS — R300 Dysuria: Secondary | ICD-10-CM

## 2017-06-10 NOTE — Telephone Encounter (Signed)
Mother states that patient is needing another ABX because daughter is having another fever again, throwing up, and urinating more and more. States daughter has been off the ABX for a week but everything is coming back. Said that the doctor across the street is not sending anything else and the other doctor is in surgery so cant send anything in. Wants to know if she needs to make an appointment, go to ED, or is something going to be sent in. Also wanted us to know they do have urine cups at home if a urine sample need to be brought in.

## 2017-06-10 NOTE — Telephone Encounter (Signed)
Pt called to make sure her pcp knows this is an emergency, pt's mother called and states she spoke w/ Dr. Lawana Chambersrawfords nurse and is expecting a call back here shortly, call pt to advise, see notes below

## 2017-06-10 NOTE — Telephone Encounter (Signed)
Spoke with pt's mom, she stated she spoke with Ms. Cordelia PenSherry and they are scheduling another sleep study because the HST only had 30 mins on it for data and this was an incomplete test. Nothing further is needed.

## 2017-06-10 NOTE — Telephone Encounter (Signed)
Placed up front and patient informed

## 2017-06-10 NOTE — Telephone Encounter (Signed)
Copied from CRM [id number]. Topic: Quick Communication - See Telephone Encounter >> Jun 10, 2017 12:34 PM Louie BunPalacios Medina, [name] wrote: CRM for notification. See Telephone encounter for: 06/10/17. Patient mom called and said that she would like to talk to Dr. Okey Duprerawford CMA about patient and urinating on herself. Please call Ms. Barrette back, thanks.

## 2017-06-11 ENCOUNTER — Encounter (HOSPITAL_COMMUNITY): Payer: Self-pay | Admitting: Emergency Medicine

## 2017-06-11 ENCOUNTER — Emergency Department (HOSPITAL_COMMUNITY)
Admission: EM | Admit: 2017-06-11 | Discharge: 2017-06-11 | Discharge status: Against Medical Advice | Payer: Medicare Other | Source: Home / Self Care

## 2017-06-11 ENCOUNTER — Telehealth: Payer: Self-pay | Admitting: Internal Medicine

## 2017-06-11 ENCOUNTER — Other Ambulatory Visit: Payer: Self-pay

## 2017-06-11 DIAGNOSIS — R509 Fever, unspecified: Secondary | ICD-10-CM | POA: Diagnosis present

## 2017-06-11 DIAGNOSIS — N12 Tubulo-interstitial nephritis, not specified as acute or chronic: Secondary | ICD-10-CM | POA: Insufficient documentation

## 2017-06-11 DIAGNOSIS — J45909 Unspecified asthma, uncomplicated: Secondary | ICD-10-CM | POA: Diagnosis not present

## 2017-06-11 DIAGNOSIS — Z79899 Other long term (current) drug therapy: Secondary | ICD-10-CM | POA: Diagnosis not present

## 2017-06-11 NOTE — Telephone Encounter (Signed)
Copied from CRM [id number]. Topic: Quick Communication - See Telephone Encounter >> Jun 11, 2017 12:06 PM Cipriano BunkerLambe, Annette S wrote: CRM for notification.   Pt. Mom, asking for doctor to please call her - before she takes to ER  See Telephone encounter for: 06/11/17.

## 2017-06-11 NOTE — Telephone Encounter (Signed)
Mom states she is sure pt has a UTI and she has no way to get there today or tomorrow.  Mom states she is going to take pt to the ED tonight after the father gets home tonight. Pt has fever, smell with urination and frequent urination.

## 2017-06-11 NOTE — Telephone Encounter (Signed)
Order placed for u/a

## 2017-06-11 NOTE — ED Notes (Signed)
Pt states that they are leaving due to wait times  

## 2017-06-11 NOTE — Telephone Encounter (Signed)
Contacted patient back to inform that a U/A was placed and they could come anytime or bring urine in at anytime. Patients mother states that they do not have any transportation to the office for the next two days and that since the office will be closed by the time she does has transportation that she will take patient to the ED tonight since the fever, frequent urination, and odor is getting worse. Also informed patient that the referral for [facility] was done and they have an appointment set up on 4/10 at 9:50. Stated understanding

## 2017-06-11 NOTE — Telephone Encounter (Signed)
Fine

## 2017-06-11 NOTE — ED Triage Notes (Signed)
Pt brought in by care giver and states she thinks she has a UTI  Pt was treated for one on the 6th but her symptoms have not improved

## 2017-06-11 NOTE — Telephone Encounter (Signed)
Copied from CRM [phone]. Topic: Quick Communication - See Telephone Encounter >> Jun 11, 2017 10:12 AM Louie BunPalacios Medina, [name] wrote: CRM for notification. See Telephone encounter for: 06/11/17. Heather from [facility] is requesting skill nurse for patient to help out with patients current UTI issues. She can be reached at [phone] and fax number is [phone].

## 2017-06-11 NOTE — Telephone Encounter (Signed)
LVM giving verbals 

## 2017-06-12 ENCOUNTER — Telehealth: Payer: Self-pay

## 2017-06-12 ENCOUNTER — Emergency Department (HOSPITAL_COMMUNITY)
Admission: EM | Admit: 2017-06-12 | Discharge: 2017-06-12 | Disposition: A | Discharge status: Home / Self Care | Payer: Medicare Other | Attending: Emergency Medicine | Admitting: Emergency Medicine

## 2017-06-12 DIAGNOSIS — N12 Tubulo-interstitial nephritis, not specified as acute or chronic: Secondary | ICD-10-CM

## 2017-06-12 LAB — URINALYSIS, ROUTINE W REFLEX MICROSCOPIC
GLUCOSE, UA: NEGATIVE mg/dL
HGB URINE DIPSTICK: NEGATIVE
KETONES UR: 5 mg/dL — AB
Nitrite: NEGATIVE
PROTEIN: 30 mg/dL — AB
Specific Gravity, Urine: 1.035 — ABNORMAL HIGH (ref 1.005–1.030)
pH: 5 (ref 5.0–8.0)

## 2017-06-12 LAB — PREGNANCY, URINE: Preg Test, Ur: NEGATIVE

## 2017-06-12 MED ORDER — ONDANSETRON 4 MG PO TBDP: 4.0000 mg | ORAL_TABLET | Freq: Three times a day (TID) | ORAL | 0 refills | Status: DC | PRN

## 2017-06-12 MED ORDER — SODIUM CHLORIDE 0.9 % IV SOLN
1.0000 g | Freq: Once | INTRAVENOUS | Status: AC
  Administered 2017-06-12: 1 g via INTRAVENOUS
  Filled 2017-06-12: qty 10

## 2017-06-12 MED ORDER — SODIUM CHLORIDE 0.9 % IV BOLUS
1000.0000 mL | Freq: Once | INTRAVENOUS | Status: AC
  Administered 2017-06-12: 1000 mL via INTRAVENOUS

## 2017-06-12 MED ORDER — CEPHALEXIN 500 MG PO CAPS: 500.0000 mg | ORAL_CAPSULE | Freq: Two times a day (BID) | ORAL | 0 refills | Status: DC

## 2017-06-12 NOTE — Telephone Encounter (Signed)
Copied from CRM [id number]. Topic: Quick Communication - See Telephone Encounter >> Jun 11, 2017 12:06 PM Cipriano BunkerLambe, Annette S wrote: CRM for notification.   Pt. Mom, asking for doctor to please call her - before she takes to ER  See Telephone encounter for: 06/11/17. >> Jun 12, 2017  1:00 PM Jolayne Hainesaylor, Brittany L wrote: Pt's mom said they took her Ed and she has UTI and they told her to call the PCP to let her know and she wants the nurse to call her back at [phone]

## 2017-06-12 NOTE — Discharge Instructions (Signed)
Please see your primary care doctor in 1 week. We have started you on appropriate antibiotics for what we suspect to be a complicated urinary infection.  Please return to the ER if your symptoms worsen; you have increased pain, fevers, chills, inability to keep any medications down, confusion. Otherwise see the outpatient doctor as requested.

## 2017-06-12 NOTE — ED Provider Notes (Signed)
[facility]-EMERGENCY DEPT Provider Note   CSN: [id number] Arrival date & time: 06/11/17  2238     History   Chief Complaint Chief Complaint  Patient presents with  . Recurrent UTI    HPI Lori Miles is a 22 y.o. female.  HPI Level 5 caveat for autism related cognitive delay.  Patient's mother provides history. 22 year old female comes in with chief complaint of fevers, reduced p.o. Intake and perineal discomfort. According to patient's mother patient has been scratching in her genital area, which is indicative of her typically being uncomfortable in that area.  Patient typically has the symptoms when she has UTI.  In addition patient has had low-grade fevers, and nausea.  Patient was recently started on antibiotics but it was only for 5 days.  Mother thinks that patient needed to be on antibiotic for a longer period, as her symptoms initially improved but then got worse again.  Patient has not complaint of abdominal pain and she does not have any URI-like symptoms.  She has had recurrent UTIs, and has developed some resistance.  Past Medical History:  Diagnosis Date  . Asthma   . Autism    mom states no actual dx, but admits she has some symptoms  . Bacteriuria   . Chronic constipation   . Cognitive developmental delay   . Disruptive behavior disorder   . Family history of adverse reaction to anesthesia    mother had itching after anesthesia one time  . Frequency-urgency syndrome   . Gait disorder    ORGANIC PER NERUOLGIST NOTE (DR HICKLING)  . 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 GERD (gastroesophageal reflux disease)   . Gout   . H/O sinus tachycardia   . History of acute respiratory failure 02/14/2015   SECONDARY TO CAP  . History of recurrent UTIs   . Interstitial cystitis   . Moderate intellectual disability   . OSA (obstructive sleep apnea)    no machine yet   . Partial epilepsy with impairment of  consciousness (HCC)    FOLLOWED BY DR HICKLING  . Pneumonia 2018 last time  . PONV (postoperative nausea and vomiting)   . Seizure (HCC)    most recent sz " at the end of summer"-last sz years ago per mother  . Urinary incontinence     Patient Active Problem List   Diagnosis Date Noted  . Fever 04/17/2017  . Allergic rhinitis 04/03/2017  . Recurrent falls 03/21/2017  . Weakness 03/21/2017  . Venous insufficiency 03/21/2017  . Obstructive sleep apnea 09/30/2016  . Excessive daytime sleepiness 03/05/2016  . Gait disorder 03/22/2015  . 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 Cough variant asthma vs UACS/ vcd  03/15/2015  . Autism spectrum disorder 02/14/2015  . Nausea & vomiting 02/14/2015  . Obesity, morbid (HCC) 12/13/2014  . 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 Acanthosis nigricans 11/14/2014  . Toeing-in 08/29/2014  . 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 Recurrent urinary tract infection 08/01/2011  . Asthma 08/01/2011  . Chronic constipation 08/01/2011  . Contraception 08/01/2011    Past Surgical History:  Procedure Laterality Date  . CYSTOSCOPY N/A 06/03/2017   Procedure: CYSTOSCOPY WITH EXAM UNDER ANESTHESIA;  Surgeon: Jerilee FieldEskridge, [name], MD;  [facility];  Service: Urology;  Laterality: N/A;  .  CYSTOSCOPY WITH HYDRODISTENSION AND BIOPSY  04/14/2012   Procedure: CYSTOSCOPY/BIOPSY/HYDRODISTENSION;  Surgeon: Lindaann Slough, MD;  Location: [facility];  Service: Urology;;  instillation of marcaine and pyridium  . EUA/  PAP SMEAR/  BREAST EXAM  03-12-2016   dr Erin Fulling [facility]  . EUA/ VAGINOSCOPY/ COLPOSCOPY FO THE HYMENAL RING  10-04-2009    dr Tamela Oddi  [facility]  . TONSILLECTOMY       OB History     None      Home Medications    Prior to Admission medications   Medication Sig Start Date End Date Taking? Authorizing Provider  acetaminophen (TYLENOL) 325 MG tablet Take 2 tablets (650 mg total) by mouth every 6 (six) hours as needed for mild pain or moderate pain. 02/27/17  Yes Everlene Farrier, PA-C  albuterol (PROVENTIL) (2.5 MG/3ML) 0.083% nebulizer solution Inhale the contents of one vial in nebulizer every four to six hours as needed for cough or wheeze. 04/23/16  Yes Kozlow, Alvira Philips, MD  budesonide (PULMICORT) 1 MG/2ML nebulizer solution Take 2 mLs (1 mg total) by nebulization 2 (two) times daily. 03/05/17  Yes Kozlow, Alvira Philips, MD  cetirizine (ZYRTEC) 10 MG tablet TAKE 1 TABLET BY MOUTH DAILY F OR RUNNY NOSE OR ITCHING 05/12/17  Yes Kozlow, Alvira Philips, MD  cloNIDine (CATAPRES) 0.1 MG tablet TAKE 1 AND 1/2 TABLET BY MOUTH AT BEDTIME 06/09/17  Yes Goodpasture, Inetta Fermo, NP  DEPAKOTE 500 MG DR tablet Give 1 tablet in the morning and 2 tablets at night Patient taking differently: Take 500-1,000 mg by mouth See admin instructions. Give 1 tablet in the morning and 2 tablets at night 03/19/17  Yes Goodpasture, Inetta Fermo, NP  fluconazole (DIFLUCAN) 100 MG tablet Take 1 tablet (100 mg total) by mouth daily. 04/30/17  Yes Denney, Rachelle A, CNM  formoterol (PERFOROMIST) 20 MCG/2ML nebulizer solution Take 2 mLs (20 mcg total) by nebulization 2 (two) times daily. 05/08/17  Yes Kozlow, Alvira Philips, MD  hyoscyamine (LEVSIN SL) 0.125 MG SL tablet Place 0.125 mg under the tongue every 4 (four) hours as needed for cramping.    Yes [provider]  Levonorgestrel-Ethinyl Estradiol (SEASONIQUE) 0.15-0.03 &0.01 MG tablet Take 1 tablet by mouth daily.   Yes [provider]  Melatonin 3 MG TABS Take 3 mg by mouth at bedtime. For insomnia   Yes [provider]  montelukast (SINGULAIR) 10 MG tablet TAKE 1 TABLET BY MOUTH EVERY EVENING TO PREVENT COUGH OR WHEEZE 06/09/17  Yes Kozlow, Alvira Philips, MD  polyethylene  glycol (MIRALAX / GLYCOLAX) packet Take 17 g by mouth 2 (two) times daily.   Yes [provider]  ranitidine (ZANTAC) 150 MG capsule Take 1 capsule (150 mg total) 2 (two) times daily by mouth. 01/24/17  Yes Pyrtle, Carie Caddy, MD  senna (SENOKOT) 8.6 MG tablet Take 1 tablet by mouth at bedtime.    Yes [provider]  traZODone (DESYREL) 50 MG tablet TAKE 2 TABLETS BY MOUTH AT BEDTIME 06/09/17  Yes Goodpasture, Inetta Fermo, NP  cephALEXin (KEFLEX) 500 MG capsule Take 1 capsule (500 mg total) by mouth 2 (two) times daily. 06/12/17   Derwood Kaplan, MD  ondansetron (ZOFRAN ODT) 4 MG disintegrating tablet Take 1 tablet (4 mg total) by mouth every 8 (eight) hours as needed for nausea or vomiting. 06/12/17   Derwood Kaplan, MD    Family History Family History  Problem Relation Age of Onset  . Seizures Mother   . Seizures Sister  1 sister has  . Pancreatic cancer Sister   . Colon cancer Neg Hx   . Colon polyps Neg Hx   . Esophageal cancer Neg Hx   . Rectal cancer Neg Hx   . Stomach cancer Neg Hx     Social History Social History   Tobacco Use  . Smoking status: Never Smoker  . Smokeless tobacco: Never Used  Substance Use Topics  . Alcohol use: No    Alcohol/week: 0.0 oz  . Drug use: No     Allergies   Abilify [aripiprazole]; Seroquel [quetiapine fumarate]; Doxycycline; Amoxicillin-pot clavulanate; and Keppra [levetiracetam]   Review of Systems Review of Systems  Unable to perform ROS: Other     Physical Exam Updated Vital Signs BP 91/79 (BP Location: Left Arm)   Pulse 86   Temp 98.3 F (36.8 C) (Oral)   Resp 15   Ht 4' 8.5" (1.435 m)   Wt 90.3 kg (199 lb)   SpO2 100%   BMI 43.83 kg/m   Physical Exam  Constitutional: She is oriented to person, place, and time. She appears well-developed.  HENT:  Head: Normocephalic and atraumatic.  Eyes: EOM are normal.  Neck: Normal range of motion. Neck supple.  Cardiovascular: Normal rate.  Pulmonary/Chest:  Effort normal.  Abdominal: Bowel sounds are normal. There is no tenderness.  No flank tenderness  Neurological: She is alert and oriented to person, place, and time.  Skin: Skin is warm and dry.  Nursing note and vitals reviewed.    ED Treatments / Results  Labs (all labs ordered are listed, but only abnormal results are displayed) Labs Reviewed  URINALYSIS, ROUTINE W REFLEX MICROSCOPIC - Abnormal; Notable for the following components:      Result Value   Color, Urine AMBER (*)    Specific Gravity, Urine 1.035 (*)    Bilirubin Urine SMALL (*)    Ketones, ur 5 (*)    Protein, ur 30 (*)    Leukocytes, UA MODERATE (*)    Bacteria, UA RARE (*)    Squamous Epithelial / LPF 0-5 (*)    All other components within normal limits  URINE CULTURE  PREGNANCY, URINE    EKG None  Radiology No results found.  Procedures Procedures (including critical care time)  Medications Ordered in ED Medications  sodium chloride 0.9 % bolus 1,000 mL (1,000 mLs Intravenous New Bag/Given 06/12/17 0319)  cefTRIAXone (ROCEPHIN) 1 g in sodium chloride 0.9 % 100 mL IVPB (1 g Intravenous New Bag/Given 06/12/17 0319)     Initial Impression / Assessment and Plan / ED Course  I have reviewed the triage vital signs and the nursing notes.  Pertinent labs & imaging results that were available during my care of the patient were reviewed by me and considered in my medical decision making (see chart for details).     22 year old female with history of autism and recurrent UTI comes in with chief complaint of nausea, fevers, perineal discomfort.  On my exam there is no abdominal tenderness, no flank tenderness.  Patient was comfortably asleep throughout the exam.  Mother states that patient has had UTI with the exact same symptoms in the past, and she was recently treated for UTI but was given only 5 days of antibiotics.  She suspects that patient required longer duration of antibiotics.  Patient's urine  analysis does show some elevated WBCs.  It is hard to make a clinical diagnosis in this scenario, but I have to honor patient's mothers assessment -  and we will treat her like a pyelonephritis given that she is now having some systemic symptoms.  No indication for any lab work at this time.  We will give IV ceftriaxone and IV fluids.  Final Clinical Impressions(s) / ED Diagnoses   Final diagnoses:  Pyelonephritis    ED Discharge Orders        Ordered    cephALEXin (KEFLEX) 500 MG capsule  2 times daily     06/12/17 0414    ondansetron (ZOFRAN ODT) 4 MG disintegrating tablet  Every 8 hours PRN     06/12/17 0414       Derwood Kaplan, MD 06/12/17 [id number]

## 2017-06-12 NOTE — Telephone Encounter (Signed)
Patient went to ED

## 2017-06-13 NOTE — Telephone Encounter (Signed)
Routing to dr crawford, patient just wanted to make you aware of what was done at ED---she's not sure if abx patient was placed on is going to work, and she may be calling you later to get something else called in----fyi.Marland Kitchen..Marland Kitchen

## 2017-06-16 LAB — URINE CULTURE

## 2017-06-17 ENCOUNTER — Telehealth: Payer: Self-pay | Admitting: *Deleted

## 2017-06-17 NOTE — Progress Notes (Signed)
ED Antimicrobial Stewardship Positive Culture Follow Up   [name] is an 22 y.o. female who [facility]2017 with a chief complaint of  Chief Complaint  Patient presents with  . Recurrent UTI    Recent Results (from the past 720 hour(s))  Culture, blood (routine x 2)     Status: None   Collection Time: 05/21/17  6:58 AM  Result Value Ref Range Status   Specimen Description BLOOD RIGHT ANTECUBITAL  Final   Special Requests   Final    BOTTLES DRAWN AEROBIC AND ANAEROBIC Blood Culture adequate volume   Culture   Final    NO GROWTH 5 DAYS Performed at [facility], [address] [place], [place] [address]    Report Status 05/26/2017 FINAL  Final  Urine culture     Status: Abnormal   Collection Time: 05/21/17  7:45 AM  Result Value Ref Range Status   Specimen Description URINE, CLEAN CATCH  Final   Special Requests   Final    NONE Performed at [facility], [address] [place], [place] [address]    Culture 60,000 COLONIES/mL PROTEUS MIRABILIS (A)  Final   Report Status 05/23/2017 FINAL  Final   Organism ID, Bacteria PROTEUS MIRABILIS (A)  Final      Susceptibility   Proteus mirabilis - MIC*    AMPICILLIN <=2 SENSITIVE Sensitive     CEFAZOLIN <=4 SENSITIVE Sensitive     CEFTRIAXONE <=1 SENSITIVE Sensitive     CIPROFLOXACIN >=4 RESISTANT Resistant     GENTAMICIN <=1 SENSITIVE Sensitive     IMIPENEM 1 SENSITIVE Sensitive     NITROFURANTOIN 128 RESISTANT Resistant     TRIMETH/SULFA >=320 RESISTANT Resistant     AMPICILLIN/SULBACTAM <=2 SENSITIVE Sensitive     PIP/TAZO <=4 SENSITIVE Sensitive     * 60,000 COLONIES/mL PROTEUS MIRABILIS  Culture, blood (routine x 2)     Status: None   Collection Time: 05/21/17  8:10 AM  Result Value Ref Range Status   Specimen Description BLOOD LEFT ANTECUBITAL  Final   Special Requests   Final    BOTTLES DRAWN AEROBIC AND ANAEROBIC Blood Culture adequate volume   Culture   Final    NO GROWTH 5  DAYS Performed at [facility], [address] [place], [place] [address]    Report Status 05/26/2017 FINAL  Final  Urine culture     Status: Abnormal   Collection Time: 06/11/17 11:43 PM  Result Value Ref Range Status   Specimen Description   Final    URINE, RANDOM Performed at [facility], [address] [place], [place] [address]    Special Requests   Final    NONE Performed at [facility], [address] [place], [place] [address]    Culture (A)  Final    50,000 COLONIES/mL PROTEUS MIRABILIS >=100,000 COLONIES/mL KLEBSIELLA OXYTOCA    Report Status 06/16/2017 FINAL  Final   Organism ID, Bacteria PROTEUS MIRABILIS (A)  Final   Organism ID, Bacteria KLEBSIELLA OXYTOCA (A)  Final      Susceptibility   Klebsiella oxytoca - MIC*    AMPICILLIN >=32 RESISTANT Resistant     CEFAZOLIN >=64 RESISTANT Resistant     CEFTRIAXONE 16 INTERMEDIATE Intermediate     CIPROFLOXACIN <=0.25 SENSITIVE Sensitive     GENTAMICIN <=1 SENSITIVE Sensitive     IMIPENEM <=0.25 SENSITIVE Sensitive     NITROFURANTOIN 64 INTERMEDIATE Intermediate  TRIMETH/SULFA <=20 SENSITIVE Sensitive     AMPICILLIN/SULBACTAM >=32 RESISTANT Resistant     PIP/TAZO >=128 RESISTANT Resistant     * >=100,000 COLONIES/mL KLEBSIELLA OXYTOCA   Proteus mirabilis - MIC*    AMPICILLIN <=2 SENSITIVE Sensitive     CEFAZOLIN <=4 SENSITIVE Sensitive     CEFTRIAXONE <=1 SENSITIVE Sensitive     CIPROFLOXACIN >=4 RESISTANT Resistant     GENTAMICIN <=1 SENSITIVE Sensitive     IMIPENEM 2 SENSITIVE Sensitive     NITROFURANTOIN 128 RESISTANT Resistant     TRIMETH/SULFA >=320 RESISTANT Resistant     AMPICILLIN/SULBACTAM <=2 SENSITIVE Sensitive     PIP/TAZO <=4 SENSITIVE Sensitive     * 50,000 COLONIES/mL PROTEUS MIRABILIS    [x]  Treated with cephalexin 500mg  PO BID x14 days and one organism (Klebsiella) resistant to prescribed antimicrobial.  New antibiotic prescription:  Trimethoprim-sulfamethoxazole (160-800mg ) tablets. Take one tablet by mouth twice daily for 7 days.   Finish cephalexin course.  ED Provider: Jodi Geralds PA-C  Praveen Coia 06/17/2017, 10:30 AM Infectious Diseases Pharmacist Phone# [phone]

## 2017-06-17 NOTE — Telephone Encounter (Signed)
Post ED Visit - Positive Culture Follow-up: Unsuccessful Patient Follow-up  Culture assessed and recommendations reviewed by:  []  Enzo BiNathan Batchelder, Pharm.D. []  Celedonio MiyamotoJeremy Frens, Pharm.D., BCPS AQ-ID []  Garvin FilaMike Maccia, Pharm.D., BCPS []  Georgina PillionElizabeth Martin, [address]D., BCPS []  NichollsMinh Pham, [address]D., BCPS, AAHIVP [x]  Estella HuskMichelle Turner, Pharm.D., BCPS, AAHIVP []  Lysle Pearlachel Rumbarger, PharmD, BCPS []  Casilda Carlsaylor Stone, PharmD, BCPS []  Pollyann SamplesAndy Johnston, PharmD, BCPS  Positive urine culture, reviewed by Jodi GeraldsKelsey [name], PA-C  []  Patient discharged without antimicrobial prescription and treatment is now indicated []  Organism is resistant to prescribed ED discharge antimicrobial []  Patient with positive blood cultures   Needs additional antibiotic therapy, continue Cephalexin course and add Trimethoprim-Sulfamethoxazole (160/800) PO BID x 7 days  Unable to contact patient after 3 attempts, letter will be sent to address on file  Lysle PearlRobertson, Kathlean Cinco Talley 06/17/2017, 11:35 AM

## 2017-06-20 ENCOUNTER — Telehealth: Payer: Self-pay | Admitting: *Deleted

## 2017-06-20 ENCOUNTER — Telehealth: Payer: Self-pay | Admitting: Internal Medicine

## 2017-06-20 NOTE — Telephone Encounter (Signed)
Pt's mom returned call. Made her aware that provider is out of the office. She would like to have a call back from Guy[place]riana

## 2017-06-20 NOTE — Telephone Encounter (Signed)
Spoke with mother.  Trimethoprim-Sulfa Methoxazole (160/800mg ) PO BID x 7 days.  [facility], [address], [phone]

## 2017-06-20 NOTE — Telephone Encounter (Signed)
LVM for patients mother that I received her message and if she wants to call back she can but Dr. Okey Duprerawford is out of the office until Monday.

## 2017-06-20 NOTE — Telephone Encounter (Signed)
Ms. Raford PitcherBarrett is calling to let Luanna ColeBriana know the antibiotic is Sulfameth-trimethoprim  800/160mg  tablet 2x a day. Please advise.

## 2017-06-20 NOTE — Telephone Encounter (Signed)
Copied from CRM [id number]. Topic: Quick Communication - See Telephone Encounter >> Jun 20, 2017 11:15 AM Louie BunPalacios Medina, [name] wrote: CRM for notification. See Telephone encounter for: 06/20/17. Patient mom called and would like a call back from Dr. Lawana Chambersrawfords CMA to discuss about pt uti issues and about the provider that saw her yesterday. Please call patient mom back.

## 2017-06-20 NOTE — Telephone Encounter (Signed)
FYI Patient's mother wants us to know she did see Dr. Yetta FlockHodges and states botox could help and they are looking into that but is not sure this is the route they want to go because does not want daughter to be put to sleep. At the ED was given Keflex but patient is immune to it and never got better was given another ABX from the ED doctor. ED states that Diamone is really sick so the UTI will probably come back after finished with ABX. Patient's mother just wants to know why daughter keeps getting the infections and no one seems to know the answer.

## 2017-06-23 NOTE — Telephone Encounter (Signed)
Can you please call and schedule patient for a visit. Thank you

## 2017-06-23 NOTE — Telephone Encounter (Signed)
I think we do have some answers and can discuss them again at a visit.

## 2017-06-24 NOTE — Telephone Encounter (Signed)
Noted  

## 2017-06-24 NOTE — Telephone Encounter (Signed)
Mother has scheduled appt for this Thursday

## 2017-06-24 NOTE — Telephone Encounter (Signed)
Pt. Wants Sam to call back.   Did not want to schedule an appt.

## 2017-06-24 NOTE — Telephone Encounter (Signed)
Pt mother will call back to get an appointment made.

## 2017-06-26 ENCOUNTER — Other Ambulatory Visit: Payer: Self-pay | Admitting: *Deleted

## 2017-06-26 ENCOUNTER — Other Ambulatory Visit (INDEPENDENT_AMBULATORY_CARE_PROVIDER_SITE_OTHER): Payer: Medicare Other

## 2017-06-26 ENCOUNTER — Encounter: Payer: Self-pay | Admitting: Internal Medicine

## 2017-06-26 ENCOUNTER — Ambulatory Visit (INDEPENDENT_AMBULATORY_CARE_PROVIDER_SITE_OTHER): Payer: Medicare Other | Admitting: Internal Medicine

## 2017-06-26 VITALS — BP 110/72 | HR 109 | Ht <= 58 in | Wt 195.0 lb

## 2017-06-26 DIAGNOSIS — R300 Dysuria: Secondary | ICD-10-CM

## 2017-06-26 DIAGNOSIS — N390 Urinary tract infection, site not specified: Secondary | ICD-10-CM | POA: Diagnosis not present

## 2017-06-26 DIAGNOSIS — R29818 Other symptoms and signs involving the nervous system: Secondary | ICD-10-CM

## 2017-06-26 LAB — URINALYSIS, ROUTINE W REFLEX MICROSCOPIC
Hgb urine dipstick: NEGATIVE
Leukocytes, UA: NEGATIVE
Nitrite: NEGATIVE
Total Protein, Urine: 30 — AB
URINE GLUCOSE: NEGATIVE
Urobilinogen, UA: 1 (ref 0.0–1.0)
pH: 6 (ref 5.0–8.0)

## 2017-06-26 NOTE — Patient Instructions (Signed)
We have put in the orders to bring back the urine.

## 2017-06-26 NOTE — Progress Notes (Signed)
   Subjective:    Patient ID: Lori Miles, female    DOB: 03/25/1995, 22 y.o.   MRN: [id number]  HPI The patient is a 22 YO female with her guardian mother who comes in with some questions. Her mom is wondering why she is getting utis some. They have seen several different urologists and they have discordant answers about her health. She is currently on antibiotics from the ER from possible UTI. Although the U/A was not infected appearing the culture did grew several bacteria. Her mother feels that her symptoms are vomiting and shaking and pulling "down there".   Review of Systems  Unable to perform ROS: Patient nonverbal      Objective:   Physical Exam  Constitutional: She appears well-developed and well-nourished.  Sleeping during much of the exam  HENT:  Head: Normocephalic and atraumatic.  Eyes: EOM are normal.  Neck: Normal range of motion.  Cardiovascular: Normal rate and regular rhythm.  Pulmonary/Chest: Effort normal and breath sounds normal. No respiratory distress. She has no wheezes. She has no rales.  Abdominal: Soft. Bowel sounds are normal. She exhibits no distension. There is no tenderness. There is no rebound.  Neurological: Coordination normal.  Will not answer questions even while awake as she is sleepy.   Skin: Skin is warm and dry.   Vitals:   06/26/17 1407  BP: 110/72  Pulse: (!) 109  SpO2: 96%  Weight: 195 lb (88.5 kg)  Height: 4' 8.5" (1.435 m)      Assessment & Plan:

## 2017-06-27 LAB — URINE CULTURE
MICRO NUMBER: 90448081
SPECIMEN QUALITY:: ADEQUATE

## 2017-06-27 NOTE — Assessment & Plan Note (Signed)
Checking U/A and urine culture today and adjust if needed. She is almost done with her antibiotics.

## 2017-06-30 ENCOUNTER — Ambulatory Visit: Payer: Self-pay

## 2017-06-30 NOTE — Telephone Encounter (Signed)
FYI

## 2017-06-30 NOTE — Telephone Encounter (Signed)
Patient's mother called and says "this weekend Lori Miles ate a salad with olive oil dressing and the dressing was too rich for her. She had diarrhea and it was bad going all night over into the morning. Then there was blood coming out with it. I bought some imodium and gave it to her. The diarrhea stopped around 1130 at night and she hasn't had any more blood coming out. I have another doctor on the other line, so I will call back if the diarrhea or blood starts back up." I asked did she want to make an appointment or triage the symptoms, she says "no, she's fine now. I will call back if I need to."    Answer Assessment - Initial Assessment Questions 1. REASON FOR CALL or QUESTION: "What is your reason for calling today?" or "How can I best help you?" or "What question do you have that I can help answer?"     She was having diarrhea, but now she's not and I will call back if it starts back up with the blood.  Protocols used: INFORMATION ONLY CALL-A-AH

## 2017-07-04 ENCOUNTER — Other Ambulatory Visit: Payer: Self-pay | Admitting: Internal Medicine

## 2017-07-05 ENCOUNTER — Other Ambulatory Visit (INDEPENDENT_AMBULATORY_CARE_PROVIDER_SITE_OTHER): Payer: Self-pay | Admitting: Internal Medicine

## 2017-07-05 DIAGNOSIS — G4700 Insomnia, unspecified: Secondary | ICD-10-CM

## 2017-07-06 DIAGNOSIS — R070 Pain in throat: Secondary | ICD-10-CM | POA: Diagnosis not present

## 2017-07-06 DIAGNOSIS — J45909 Unspecified asthma, uncomplicated: Secondary | ICD-10-CM | POA: Insufficient documentation

## 2017-07-06 DIAGNOSIS — R509 Fever, unspecified: Secondary | ICD-10-CM | POA: Diagnosis present

## 2017-07-06 DIAGNOSIS — Z79899 Other long term (current) drug therapy: Secondary | ICD-10-CM | POA: Diagnosis not present

## 2017-07-06 DIAGNOSIS — R05 Cough: Secondary | ICD-10-CM | POA: Diagnosis not present

## 2017-07-06 DIAGNOSIS — J189 Pneumonia, unspecified organism: Secondary | ICD-10-CM | POA: Diagnosis not present

## 2017-07-06 NOTE — ED Triage Notes (Signed)
Per pt's mother "she took abx for UTI  But she started running a fever yesterday afternoon.   She also has a runny nose."

## 2017-07-06 NOTE — ED Notes (Signed)
Pt coughed up mucus that is yellow.

## 2017-07-07 ENCOUNTER — Other Ambulatory Visit: Payer: Self-pay | Admitting: Allergy and Immunology

## 2017-07-07 ENCOUNTER — Emergency Department (HOSPITAL_COMMUNITY)
Admission: EM | Admit: 2017-07-07 | Discharge: 2017-07-07 | Disposition: A | Discharge status: Home / Self Care | Payer: Medicare Other | Attending: Emergency Medicine | Admitting: Emergency Medicine

## 2017-07-07 ENCOUNTER — Telehealth (INDEPENDENT_AMBULATORY_CARE_PROVIDER_SITE_OTHER): Payer: Self-pay | Admitting: Family

## 2017-07-07 ENCOUNTER — Telehealth: Payer: Self-pay | Admitting: Allergy and Immunology

## 2017-07-07 ENCOUNTER — Telehealth: Payer: Self-pay | Admitting: Internal Medicine

## 2017-07-07 ENCOUNTER — Emergency Department (HOSPITAL_COMMUNITY): Payer: Medicare Other

## 2017-07-07 ENCOUNTER — Telehealth: Payer: Self-pay

## 2017-07-07 DIAGNOSIS — J189 Pneumonia, unspecified organism: Secondary | ICD-10-CM | POA: Diagnosis not present

## 2017-07-07 DIAGNOSIS — G4700 Insomnia, unspecified: Secondary | ICD-10-CM

## 2017-07-07 LAB — CBC
HEMATOCRIT: 34.1 % — AB (ref 36.0–46.0)
Hemoglobin: 11.2 g/dL — ABNORMAL LOW (ref 12.0–15.0)
MCH: 30.2 pg (ref 26.0–34.0)
MCHC: 32.8 g/dL (ref 30.0–36.0)
MCV: 91.9 fL (ref 78.0–100.0)
Platelets: 267 10*3/uL (ref 150–400)
RBC: 3.71 MIL/uL — AB (ref 3.87–5.11)
RDW: 14.1 % (ref 11.5–15.5)
WBC: 9.4 10*3/uL (ref 4.0–10.5)

## 2017-07-07 LAB — BASIC METABOLIC PANEL
Anion gap: 11 (ref 5–15)
BUN: 11 mg/dL (ref 6–20)
CHLORIDE: 106 mmol/L (ref 101–111)
CO2: 23 mmol/L (ref 22–32)
CREATININE: 0.87 mg/dL (ref 0.44–1.00)
Calcium: 8.8 mg/dL — ABNORMAL LOW (ref 8.9–10.3)
GFR calc Af Amer: >60 mL/min (ref >60)
GFR calc non Af Amer: >60 mL/min (ref >60)
Glucose, Bld: 90 mg/dL (ref 65–99)
POTASSIUM: 4.3 mmol/L (ref 3.5–5.1)
Sodium: 140 mmol/L (ref 135–145)

## 2017-07-07 LAB — URINALYSIS, ROUTINE W REFLEX MICROSCOPIC
BACTERIA UA: NONE SEEN
Bilirubin Urine: NEGATIVE
Glucose, UA: NEGATIVE mg/dL
Ketones, ur: 20 mg/dL — AB
Leukocytes, UA: NEGATIVE
NITRITE: NEGATIVE
PROTEIN: NEGATIVE mg/dL
Specific Gravity, Urine: 1.028 (ref 1.005–1.030)
pH: 5 (ref 5.0–8.0)

## 2017-07-07 LAB — I-STAT BETA HCG BLOOD, ED (MC, WL, AP ONLY): I-stat hCG, quantitative: <5 m[IU]/mL (ref <5)

## 2017-07-07 MED ORDER — ALBUTEROL SULFATE (2.5 MG/3ML) 0.083% IN NEBU: INHALATION_SOLUTION | RESPIRATORY_TRACT | 5 refills | Status: DC

## 2017-07-07 MED ORDER — ONDANSETRON 8 MG PO TBDP
8.0000 mg | ORAL_TABLET | Freq: Once | ORAL | Status: AC
  Administered 2017-07-07: 8 mg via ORAL
  Filled 2017-07-07: qty 1

## 2017-07-07 MED ORDER — LEVOFLOXACIN 500 MG PO TABS: 500.0000 mg | ORAL_TABLET | Freq: Once | ORAL | Status: DC

## 2017-07-07 MED ORDER — LEVOFLOXACIN 750 MG PO TABS: 750.0000 mg | ORAL_TABLET | Freq: Every day | ORAL | 0 refills | Status: DC

## 2017-07-07 MED ORDER — TRAZODONE HCL 50 MG PO TABS: 100.0000 mg | ORAL_TABLET | Freq: Every day | ORAL | 0 refills | Status: DC

## 2017-07-07 MED ORDER — ALBUTEROL SULFATE (2.5 MG/3ML) 0.083% IN NEBU
5.0000 mg | INHALATION_SOLUTION | Freq: Once | RESPIRATORY_TRACT | Status: AC
  Administered 2017-07-07: 5 mg via RESPIRATORY_TRACT
  Filled 2017-07-07: qty 6

## 2017-07-07 MED ORDER — LEVOFLOXACIN 750 MG PO TABS
750.0000 mg | ORAL_TABLET | Freq: Once | ORAL | Status: AC
  Administered 2017-07-07: 750 mg via ORAL
  Filled 2017-07-07: qty 1

## 2017-07-07 MED ORDER — CLONIDINE HCL 0.1 MG PO TABS: ORAL_TABLET | ORAL | 0 refills | Status: DC

## 2017-07-07 NOTE — Telephone Encounter (Signed)
FYI

## 2017-07-07 NOTE — Telephone Encounter (Signed)
I am not sure why the medication has been denied. The patient has [employer] as primary and Medicaid as secondary. I resent the prescriptions

## 2017-07-07 NOTE — Telephone Encounter (Signed)
It appears Dr Okey Duprerawford refilled rx while I was awaiting response from Dr Rhea BeltonPyrtle, so no refill needed from Dr Rhea BeltonPyrtle at this time.

## 2017-07-07 NOTE — Telephone Encounter (Signed)
Script sent to Albuterol.

## 2017-07-07 NOTE — ED Notes (Signed)
Mother requesting pt have a breathing tx.  LS clear throughout with ausculation.

## 2017-07-07 NOTE — ED Provider Notes (Signed)
[facility]-EMERGENCY DEPT Provider Note   CSN: [id number] Arrival date & time: 07/06/17  2210     History   Chief [id number] Chief Complaint  Patient presents with  . Fever  . Sore Throat   Level 5 caveat: Autism  HPI Lori Miles is a 22 y.o. female.  HPI  Patient is a 22 year old female with a history of autism who was brought to the emergency department by her mother for upper respiratory symptoms over the past several days with associated fever.  She has had productive cough.  No reports of abdominal or back pain.  Patient also has a history of recurrent urinary tract infections and the mother reports the patient has been touching her groin more often which is usually an indicator that she may have a UTI.  Patient also has a history of asthma.     Past Medical History:  Diagnosis Date  . Asthma   . Autism    mom states no actual dx, but admits she has some symptoms  . Bacteriuria   . Chronic constipation   . Cognitive developmental delay   . Disruptive behavior disorder   . Family history of adverse reaction to anesthesia    mother had itching after anesthesia one time  . Frequency-urgency syndrome   . Gait disorder    ORGANIC PER NERUOLGIST NOTE (DR HICKLING)  . 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 GERD (gastroesophageal reflux disease)   . Gout   . H/O sinus tachycardia   . History of acute respiratory failure 02/14/2015   SECONDARY TO CAP  . History of recurrent UTIs   . Interstitial cystitis   . Moderate intellectual disability   . OSA (obstructive sleep apnea)    no machine yet   . Partial epilepsy with impairment of consciousness (HCC)    FOLLOWED BY DR HICKLING  . Pneumonia 2018 last time  . PONV (postoperative nausea and vomiting)   . Seizure (HCC)    most recent sz " at the end of summer"-last sz years ago per mother  . Urinary incontinence     Patient Active Problem List   Diagnosis Date  Noted  . Allergic rhinitis 04/03/2017  . Recurrent falls 03/21/2017  . Weakness 03/21/2017  . Venous insufficiency 03/21/2017  . Obstructive sleep apnea 09/30/2016  . Excessive daytime sleepiness 03/05/2016  . Gait disorder 03/22/2015  . 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 Cough variant asthma vs UACS/ vcd  03/15/2015  . Autism spectrum disorder 02/14/2015  . Nausea & vomiting 02/14/2015  . Obesity, morbid (HCC) 12/13/2014  . 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 Acanthosis nigricans 11/14/2014  . Toeing-in 08/29/2014  . 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 Recurrent urinary tract infection 08/01/2011  . Asthma 08/01/2011  . Chronic constipation 08/01/2011  . Contraception 08/01/2011    Past Surgical History:  Procedure Laterality Date  . CYSTOSCOPY N/A 06/03/2017   Procedure: CYSTOSCOPY WITH EXAM UNDER ANESTHESIA;  Surgeon: Jerilee FieldEskridge, [name], MD;  [facility];  Service: Urology;  Laterality: N/A;  . CYSTOSCOPY WITH HYDRODISTENSION AND BIOPSY  04/14/2012   Procedure: CYSTOSCOPY/BIOPSY/HYDRODISTENSION;  Surgeon: Lindaann SloughMarc-Henry Nesi, MD;  Location: [facility];  Service: Urology;;  instillation of marcaine and pyridium  . EUA/  PAP SMEAR/  BREAST EXAM  03-12-2016   dr Erin Fulling [facility]  . EUA/ VAGINOSCOPY/ COLPOSCOPY FO THE HYMENAL RING  10-04-2009    dr Tamela Oddi  [facility]  . TONSILLECTOMY       OB History   None      Home Medications    Prior to Admission medications   Medication Sig Start Date End Date Taking? Authorizing Provider  acetaminophen (TYLENOL) 325 MG tablet Take 2 tablets (650 mg total) by mouth every 6 (six) hours as needed for mild pain or moderate pain. 02/27/17   Everlene Farrier, PA-C    albuterol (PROVENTIL) (2.5 MG/3ML) 0.083% nebulizer solution Inhale the contents of one vial in nebulizer every four to six hours as needed for cough or wheeze. 04/23/16   Kozlow, Alvira Philips, MD  budesonide (PULMICORT) 1 MG/2ML nebulizer solution Take 2 mLs (1 mg total) by nebulization 2 (two) times daily. 03/05/17   Kozlow, Alvira Philips, MD  cephALEXin (KEFLEX) 500 MG capsule Take 1 capsule (500 mg total) by mouth 2 (two) times daily. 06/12/17   Derwood Kaplan, MD  cetirizine (ZYRTEC) 10 MG tablet TAKE 1 TABLET BY MOUTH DAILY F OR RUNNY NOSE OR ITCHING 05/12/17   Kozlow, Alvira Philips, MD  cloNIDine (CATAPRES) 0.1 MG tablet TAKE 1 AND 1/2 TABLET BY MOUTH AT BEDTIME 06/09/17   Elveria Rising, NP  DEPAKOTE 500 MG DR tablet Give 1 tablet in the morning and 2 tablets at night Patient taking differently: Take 500-1,000 mg by mouth See admin instructions. Give 1 tablet in the morning and 2 tablets at night 03/19/17   Elveria Rising, NP  fluconazole (DIFLUCAN) 100 MG tablet Take 1 tablet (100 mg total) by mouth daily. 04/30/17   Denney, Boykin Reaper A, CNM  formoterol (PERFOROMIST) 20 MCG/2ML nebulizer solution Take 2 mLs (20 mcg total) by nebulization 2 (two) times daily. 05/08/17   Kozlow, Alvira Philips, MD  hyoscyamine (LEVSIN SL) 0.125 MG SL tablet Place 0.125 mg under the tongue every 4 (four) hours as needed for cramping.     [provider]  levofloxacin (LEVAQUIN) 750 MG tablet Take 1 tablet (750 mg total) by mouth daily. 07/07/17   Azalia Bilis, MD  Levonorgestrel-Ethinyl Estradiol (SEASONIQUE) 0.15-0.03 &0.01 MG tablet Take 1 tablet by mouth daily.    [provider]  Melatonin 3 MG TABS Take 3 mg by mouth at bedtime. For insomnia    [provider]  montelukast (SINGULAIR) 10 MG tablet TAKE 1 TABLET BY MOUTH EVERY EVENING TO PREVENT COUGH OR WHEEZE 06/09/17   Kozlow, Alvira Philips, MD  ondansetron (ZOFRAN ODT) 4 MG disintegrating tablet Take 1 tablet (4 mg total) by mouth every 8 (eight) hours as needed  for nausea or vomiting. 06/12/17   Derwood Kaplan, MD  polyethylene glycol (MIRALAX / GLYCOLAX) packet Take 17 g by mouth 2 (two) times daily.    [provider]  ranitidine (ZANTAC) 150 MG capsule Take 1 capsule (150 mg total) 2 (two) times daily by mouth. 01/24/17   Pyrtle, Carie Caddy, MD  senna (SENOKOT) 8.6 MG tablet Take 1 tablet by mouth at bedtime.     [provider]  traZODone (DESYREL) 50 MG tablet TAKE 2 TABLETS BY MOUTH AT BEDTIME 06/09/17   Elveria Rising, NP    Family History Family History  Problem Relation Age of Onset  . Seizures Mother   . Seizures Sister        1 sister has  . Pancreatic cancer Sister   . Colon cancer Neg  Hx   . Colon polyps Neg Hx   . Esophageal cancer Neg Hx   . Rectal cancer Neg Hx   . Stomach cancer Neg Hx     Social History Social History   Tobacco Use  . Smoking status: Never Smoker  . Smokeless tobacco: Never Used  Substance Use Topics  . Alcohol use: No    Alcohol/week: 0.0 oz  . Drug use: No     Allergies   Abilify [aripiprazole]; Seroquel [quetiapine fumarate]; Doxycycline; Amoxicillin-pot clavulanate; and Keppra [levetiracetam]   Review of Systems Review of Systems  Unable to perform ROS: Other     Physical Exam Updated Vital Signs BP (!) 111/48 (BP Location: Left Arm)   Pulse 68   Temp (!) 97.5 F (36.4 C) (Oral)   Resp 19   Ht 4' 9.5" (1.461 m)   Wt 88.5 kg (195 lb)   SpO2 98%   BMI 41.47 kg/m   Physical Exam   ED Treatments / Results  Labs (all labs ordered are listed, but only abnormal results are displayed) Labs Reviewed  CBC - Abnormal; Notable for the following components:      Result Value   RBC 3.71 (*)    Hemoglobin 11.2 (*)    HCT 34.1 (*)    All other components within normal limits  BASIC METABOLIC PANEL - Abnormal; Notable for the following components:   Calcium 8.8 (*)    All other components within normal limits  I-STAT BETA HCG BLOOD, ED ([facility], [facility], [facility] ONLY)     EKG None  Radiology Dg Chest 2 View  Result Date: 07/07/2017 CLINICAL DATA:  Acute onset of cough and fever. EXAM: CHEST - 2 VIEW COMPARISON:  Chest radiograph performed 04/22/2017 FINDINGS: The lungs are mildly hypoexpanded. Mild left basilar airspace opacity raises concern for pneumonia. There is no evidence of pleural effusion or pneumothorax. The heart is normal in size; the mediastinal contour is within normal limits. No acute osseous abnormalities are seen. IMPRESSION: Mild left basilar pneumonia.  Lungs mildly hypoexpanded. Electronically Signed   By: Roanna Raider M.D.   On: 07/07/2017 04:30    Procedures Procedures (including critical care time)  Medications Ordered in ED Medications  levofloxacin (LEVAQUIN) tablet 750 mg (has no administration in time range)  ondansetron (ZOFRAN-ODT) disintegrating tablet 8 mg (8 mg Oral Given 07/07/17 0455)  albuterol (PROVENTIL) (2.5 MG/3ML) 0.083% nebulizer solution 5 mg (5 mg Nebulization Given 07/07/17 0455)     Initial Impression / Assessment and Plan / ED Course  I have reviewed the triage vital signs and the nursing notes.  Pertinent labs & imaging results that were available during my care of the patient were reviewed by me and considered in my medical decision making (see chart for details).     No hypoxia.  No increased work of breathing.  Chest x-ray concerning for developing pneumonia.  Patient will be started on Levaquin.  Patient does have a history of frequent UTIs it sounds like she could have one again.  She is unable to give [name] a urine sample here.  The mother does not want In-N-Out catheter at this time.  The mother would prefer to try and collect her urine at home and take it to her primary care doctor.  Levaquin has decent urinary coverage and hopefully this will help as well.  Mother understands that we are unable to tell her whether or not she has a UTI.  The mother takes responsibility for this.  Vital signs are  stable.  Discharged home in good condition.  Primary care follow-up.  Mother understands to return the patient to the ER for new or worsening symptoms  Dispo: Home  Final Clinical Impressions(s) / ED Diagnoses   Final diagnoses:  None    ED Discharge Orders        Ordered    levofloxacin (LEVAQUIN) 750 MG tablet  Daily     07/07/17 0618       Azalia Bilis, MD 07/07/17 [phone]

## 2017-07-07 NOTE — ED Notes (Signed)
Mother signed for pt

## 2017-07-07 NOTE — ED Notes (Addendum)
Pt is able to rest comfortably. No audible wheezing is heard.  Mother stated that the pt has been scratching and tugging on private parts (normally when she has a UTI). Pt has hx of recurrent UTIs.

## 2017-07-07 NOTE — Telephone Encounter (Signed)
Pt mom called and said that she needs the albut. For neb called into pharmacy.

## 2017-07-07 NOTE — Telephone Encounter (Signed)
Ok for zofran refill

## 2017-07-07 NOTE — Telephone Encounter (Signed)
Dr Rhea BeltonPyrtle- Please advise.... Okay to refill zofran? Looks like patient was in ER today with pneumonia and presumed UTI?!

## 2017-07-07 NOTE — Telephone Encounter (Signed)
°  Who's calling (name and relationship to patient) : Lonie Peakcquanetta (Mother) Best contact number: [phone] Provider they see: [name] Reason for call: Mom stated that medication refills for Clonodine and Trazodone were denied. Mom would like to know why. Mom stated that pt has been sick which is why she has not been able to make an appt with [name]. Mom also would like for [name] to know that pt has been experiencing UTIs that she suspects is causing pt's heart rate to be elevated.

## 2017-07-07 NOTE — Telephone Encounter (Signed)
Copied from CRM [phone]. Topic: Quick Communication - See Telephone Encounter >> Jul 07, 2017  2:42 PM Waymon AmatoBurton, Donna F wrote: CRM for notification. See Telephone encounter for: 07/07/17.pt is wanitng to let dr crawford to know that she went to  the hospital with pnemonia  Best number [phone]

## 2017-07-08 LAB — URINE CULTURE

## 2017-07-08 NOTE — Telephone Encounter (Signed)
Mom emailed and asked me to call her. I called and she said that Lori Miles has been diagnosed with pneumonia and may also have a urinary tract infection. She said that she was seen in the ER yesterday and was not any better today. Mom plans to take her back to ER if she does not show improvement soon. I asked Mom about the refills and she said that she had not been to [facility] to pick them up today. I instructed her to have the pharmacy contact this office directly if there continue to be problems with the refills. Mom agreed with this plan. TG

## 2017-07-09 ENCOUNTER — Telehealth: Payer: Self-pay

## 2017-07-09 ENCOUNTER — Emergency Department (HOSPITAL_COMMUNITY)
Admission: EM | Admit: 2017-07-09 | Discharge: 2017-07-10 | Disposition: A | Discharge status: Home / Self Care | Payer: Medicare Other | Attending: Emergency Medicine | Admitting: Emergency Medicine

## 2017-07-09 ENCOUNTER — Encounter (HOSPITAL_COMMUNITY): Payer: Self-pay | Admitting: Emergency Medicine

## 2017-07-09 ENCOUNTER — Emergency Department (HOSPITAL_COMMUNITY): Payer: Medicare Other

## 2017-07-09 DIAGNOSIS — Z79899 Other long term (current) drug therapy: Secondary | ICD-10-CM | POA: Insufficient documentation

## 2017-07-09 DIAGNOSIS — F840 Autistic disorder: Secondary | ICD-10-CM | POA: Insufficient documentation

## 2017-07-09 DIAGNOSIS — R112 Nausea with vomiting, unspecified: Secondary | ICD-10-CM | POA: Insufficient documentation

## 2017-07-09 DIAGNOSIS — J45901 Unspecified asthma with (acute) exacerbation: Secondary | ICD-10-CM | POA: Diagnosis not present

## 2017-07-09 NOTE — ED Triage Notes (Signed)
Pt arrives with family/guardian with wheezing improved with albuterol treatments. Family reports recent dx of pneumonia, currently being treated with levoquin for PNA. No wheezing in LS today.

## 2017-07-09 NOTE — ED Notes (Signed)
Pt and family up to nurse first desk inquiring about wait times, pt getting restless per family and wants to leave. Encouraged pt and family to stay, apologized for delays.

## 2017-07-09 NOTE — Telephone Encounter (Signed)
Copied from CRM [id number]. Topic: General - Other >> Jul 09, 2017 11:31 AM Percival SpanishKennedy, Cheryl W wrote:  Pt Mom call and ask for Dr Okey Duprerawford nurse to give her a call back. Did not want to elaborate on the reason why she is requesting the call back

## 2017-07-09 NOTE — Telephone Encounter (Signed)
Called patient's mother back and she states that she is worried because the Levaquin Novali has been put on for the pneumonia has not worked for patient in the past and does not seem to be working for her now. Patient has only take 4 out of the 10 pills but mother states that patient is still congested, patients mother was informed that it takes time it wont fix it immediatly. Patient is also still unable to keep solids down and is throwing up, but is able to keep liquids down. Patients mother is also requesting for the zofran to be sent in as 60 or 30 with refills because patient takes it twice a day. Also states she asked for refills of Zofran  from Dr. Rhea BeltonPyrtle and was unsure how it ended up with us. Patient's mother states that she know that she will need to do a follow up for the ED visit but since family is down to one car they have not been able to make a visit yet. Patient's mother informed that Dr. Okey Duprerawford is out of the office till tomorrow.

## 2017-07-10 DIAGNOSIS — R112 Nausea with vomiting, unspecified: Secondary | ICD-10-CM | POA: Diagnosis not present

## 2017-07-10 MED ORDER — PREDNISONE 20 MG PO TABS
60.0000 mg | ORAL_TABLET | Freq: Once | ORAL | Status: AC
  Administered 2017-07-10: 60 mg via ORAL
  Filled 2017-07-10: qty 3

## 2017-07-10 MED ORDER — ONDANSETRON 4 MG PO TBDP
8.0000 mg | ORAL_TABLET | Freq: Once | ORAL | Status: AC
  Administered 2017-07-10: 8 mg via ORAL
  Filled 2017-07-10: qty 2

## 2017-07-10 MED ORDER — PROMETHAZINE HCL 25 MG RE SUPP: 25.0000 mg | Freq: Four times a day (QID) | RECTAL | 0 refills | Status: DC | PRN

## 2017-07-10 MED ORDER — PREDNISONE 10 MG PO TABS: 60.0000 mg | ORAL_TABLET | Freq: Every day | ORAL | 0 refills | Status: DC

## 2017-07-10 NOTE — ED Provider Notes (Signed)
[facility] EMERGENCY DEPARTMENT Provider Note   CSN: [id number] Arrival date & time: 07/09/17  2130     History   Chief Complaint Chief Complaint  Patient presents with  . Pneumonia    HPI Lori Miles is a 22 y.o. female.  HPI  Level 5 caveat for autism. 23 year old female brought in by mother with chief complaint of nausea, vomiting and wheezing.  Patient was recently started on Levaquin for pneumonia.  Patient states that earlier today she was wheezing and required nebulizer treatment at home by the home nurse.  The home nurse advised the patient come to the ER for further evaluation.  Additionally, mother states that patient has been having nausea and vomiting.  She is unsure if patient is able to keep her medications down.  Finally, patient's mother wants to go home as she has been waiting for a long period of time and has other applications to take care of.  Past Medical History:  Diagnosis Date  . Asthma   . Autism    mom states no actual dx, but admits she has some symptoms  . Bacteriuria   . Chronic constipation   . Cognitive developmental delay   . Disruptive behavior disorder   . Family history of adverse reaction to anesthesia    mother had itching after anesthesia one time  . Frequency-urgency syndrome   . Gait disorder    ORGANIC PER NERUOLGIST NOTE (DR HICKLING)  . 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 GERD (gastroesophageal reflux disease)   . Gout   . H/O sinus tachycardia   . History of acute respiratory failure 02/14/2015   SECONDARY TO CAP  . History of recurrent UTIs   . Interstitial cystitis   . Moderate intellectual disability   . OSA (obstructive sleep apnea)    no machine yet   . Partial epilepsy with impairment of consciousness (HCC)    FOLLOWED BY DR HICKLING  . Pneumonia 2018 last time  . PONV (postoperative nausea and vomiting)   . Seizure (HCC)    most recent sz " at the end of  summer"-last sz years ago per mother  . Urinary incontinence     Patient Active Problem List   Diagnosis Date Noted  . Allergic rhinitis 04/03/2017  . Recurrent falls 03/21/2017  . Weakness 03/21/2017  . Venous insufficiency 03/21/2017  . Obstructive sleep apnea 09/30/2016  . Excessive daytime sleepiness 03/05/2016  . Gait disorder 03/22/2015  . 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 Cough variant asthma vs UACS/ vcd  03/15/2015  . Autism spectrum disorder 02/14/2015  . Nausea & vomiting 02/14/2015  . Obesity, morbid (HCC) 12/13/2014  . 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 Acanthosis nigricans 11/14/2014  . Toeing-in 08/29/2014  . 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 Recurrent urinary tract infection 08/01/2011  . Asthma 08/01/2011  . Chronic constipation 08/01/2011  . Contraception 08/01/2011    Past Surgical History:  Procedure Laterality Date  . CYSTOSCOPY N/A 06/03/2017   Procedure: CYSTOSCOPY WITH EXAM UNDER ANESTHESIA;  Surgeon: Jerilee Field, MD;  Location: [facility];  Service: Urology;  Laterality: N/A;  . CYSTOSCOPY WITH HYDRODISTENSION AND BIOPSY  04/14/2012   Procedure: CYSTOSCOPY/BIOPSY/HYDRODISTENSION;  Surgeon: Lindaann Slough, MD;  Location: [facility];  Service:  Urology;;  instillation of marcaine and pyridium  . EUA/  PAP SMEAR/  BREAST EXAM  03-12-2016   dr Erin Fulling [facility]  . EUA/ VAGINOSCOPY/ COLPOSCOPY FO THE HYMENAL RING  10-04-2009    dr Tamela Oddi  [facility]  . TONSILLECTOMY       OB History   None      Home Medications    Prior to Admission medications   Medication Sig Start Date End Date Taking? Authorizing Provider  acetaminophen (TYLENOL) 325 MG tablet Take 2 tablets (650 mg  total) by mouth every 6 (six) hours as needed for mild pain or moderate pain. 02/27/17   Everlene Farrier, PA-C  albuterol (PROVENTIL) (2.5 MG/3ML) 0.083% nebulizer solution INHALE CONTENTS OF 1 VIAL IN NEBULIZER EVERY 4 TO 6 HOURS IF NEEDED FOR COUGH OR WHEEZING 07/07/17   Kozlow, Alvira Philips, MD  budesonide (PULMICORT) 1 MG/2ML nebulizer solution Take 2 mLs (1 mg total) by nebulization 2 (two) times daily. 03/05/17   Kozlow, Alvira Philips, MD  cephALEXin (KEFLEX) 500 MG capsule Take 1 capsule (500 mg total) by mouth 2 (two) times daily. 06/12/17   Derwood Kaplan, MD  cetirizine (ZYRTEC) 10 MG tablet TAKE 1 TABLET BY MOUTH DAILY F OR RUNNY NOSE OR ITCHING 05/12/17   Kozlow, Alvira Philips, MD  cloNIDine (CATAPRES) 0.1 MG tablet TAKE 1 AND 1/2 TABLET BY MOUTH AT BEDTIME 07/07/17   Elveria Rising, NP  DEPAKOTE 500 MG DR tablet Give 1 tablet in the morning and 2 tablets at night Patient taking differently: Take 500-1,000 mg by mouth See admin instructions. Give 1 tablet in the morning and 2 tablets at night 03/19/17   Elveria Rising, NP  fluconazole (DIFLUCAN) 100 MG tablet Take 1 tablet (100 mg total) by mouth daily. 04/30/17   Denney, Boykin Reaper A, CNM  formoterol (PERFOROMIST) 20 MCG/2ML nebulizer solution Take 2 mLs (20 mcg total) by nebulization 2 (two) times daily. 05/08/17   Kozlow, Alvira Philips, MD  hyoscyamine (LEVSIN SL) 0.125 MG SL tablet Place 0.125 mg under the tongue every 4 (four) hours as needed for cramping.     [provider]  levofloxacin (LEVAQUIN) 750 MG tablet Take 1 tablet (750 mg total) by mouth daily. 07/07/17   Azalia Bilis, MD  Levonorgestrel-Ethinyl Estradiol (SEASONIQUE) 0.15-0.03 &0.01 MG tablet Take 1 tablet by mouth daily.    [provider]  Melatonin 3 MG TABS Take 3 mg by mouth at bedtime. For insomnia    [provider]  montelukast (SINGULAIR) 10 MG tablet TAKE 1 TABLET BY MOUTH EVERY EVENING TO PREVENT COUGH OR WHEEZE 06/09/17   Kozlow, Alvira Philips, MD  ondansetron (ZOFRAN  ODT) 4 MG disintegrating tablet Take 1 tablet (4 mg total) by mouth every 8 (eight) hours as needed for nausea or vomiting. 06/12/17   Derwood Kaplan, MD  ondansetron (ZOFRAN-ODT) 4 MG disintegrating tablet DISSOLVE 1 TABLET(4 MG) ON THE TONGUE EVERY 8 HOURS AS NEEDED FOR NAUSEA OR VOMITING 07/07/17   Myrlene Broker, MD  polyethylene glycol (MIRALAX / GLYCOLAX) packet Take 17 g by mouth 2 (two) times daily.    [provider]  predniSONE (DELTASONE) 10 MG tablet Take 6 tablets (60 mg total) by mouth daily. 07/10/17   Derwood Kaplan, MD  promethazine (PHENERGAN) 25 MG suppository Place 1 suppository (25 mg total) rectally every 6 (six) hours as needed for nausea or vomiting. 07/10/17   Derwood Kaplan, MD  ranitidine (ZANTAC) 150 MG capsule Take 1 capsule (150 mg total) 2 (two) times  daily by mouth. 01/24/17   Pyrtle, Carie CaddyJay M, MD  senna (SENOKOT) 8.6 MG tablet Take 1 tablet by mouth at bedtime.     [provider]  traZODone (DESYREL) 50 MG tablet Take 2 tablets (100 mg total) by mouth at bedtime. 07/07/17   Elveria RisingGoodpasture, Tina, NP    Family History Family History  Problem Relation Age of Onset  . Seizures Mother   . Seizures Sister        1 sister has  . Pancreatic cancer Sister   . Colon cancer Neg Hx   . Colon polyps Neg Hx   . Esophageal cancer Neg Hx   . Rectal cancer Neg Hx   . Stomach cancer Neg Hx     Social History Social History   Tobacco Use  . Smoking status: Never Smoker  . Smokeless tobacco: Never Used  Substance Use Topics  . Alcohol use: No    Alcohol/week: 0.0 oz  . Drug use: No     Allergies   Abilify [aripiprazole]; Seroquel [quetiapine fumarate]; Doxycycline; Amoxicillin-pot clavulanate; and Keppra [levetiracetam]   Review of Systems Review of Systems  Unable to perform ROS: Mental status change     Physical Exam Updated Vital Signs BP 110/70   Pulse (!) 103   Temp 98.5 F (36.9 C) (Oral)   Resp 18   SpO2 98%   Physical Exam    Constitutional: She appears well-developed.  HENT:  Head: Normocephalic and atraumatic.  Moist mucous membranes  Eyes: EOM are normal.  Neck: Normal range of motion. Neck supple.  Cardiovascular: Normal rate.  Heart rate in the 90s  Pulmonary/Chest: Effort normal.  Abdominal: Bowel sounds are normal. There is no tenderness.  Lymphadenopathy:    She has no cervical adenopathy.  Neurological: She is alert.  Skin: Skin is warm and dry.  Nursing note and vitals reviewed.    ED Treatments / Results  Labs (all labs ordered are listed, but only abnormal results are displayed) Labs Reviewed - No data to display  EKG None  Radiology Dg Chest 2 View  Result Date: 07/09/2017 CLINICAL DATA:  22 y/o F; wheezing. Recent pneumonia diagnosis. Treatment with Levaquin and albuterol. EXAM: CHEST - 2 VIEW COMPARISON:  07/07/2017 chest radiograph FINDINGS: Normal cardiac silhouette. Clear lungs. No pleural effusion or pneumothorax. No acute osseous abnormality is evident. IMPRESSION: No acute pulmonary process identified. Left basilar opacity no longer appreciated. Electronically Signed   By: Mitzi HansenLance  Furusawa-Stratton M.D.   On: 07/09/2017 22:35    Procedures Procedures (including critical care time)  Medications Ordered in ED Medications  predniSONE (DELTASONE) tablet 60 mg (60 mg Oral Given 07/10/17 0214)  ondansetron (ZOFRAN-ODT) disintegrating tablet 8 mg (8 mg Oral Given 07/10/17 0214)     Initial Impression / Assessment and Plan / ED Course  I have reviewed the triage vital signs and the nursing notes.  Pertinent labs & imaging results that were available during my care of the patient were reviewed by me and considered in my medical decision making (see chart for details).     22 year old comes in with chief complaint of nausea, vomiting and wheezing.  On my exam the wheezing had cleared, however patient had received breathing treatments prior to ED arrival and potentially at triage.   Patient is being treated with Levaquin for pneumonia and her chest x-ray today is actually looking better.  Patient has been having nausea and vomiting as well according to mother.  She has history of UTI with  similar symptoms in the past.  Urine results from few days ago reviewed and was normal.  I asked patient's mother if she would be okay with [name] checking basic labs and get a urine test, however mother states that she really has to leave because of other obligations.  She will bring her daughter into the ER if her symptoms get worse.  Patient does not appear to be profoundly dehydrated, she is alert and responsive.  Patient's mother wants to leave against medical advice. She understands that her actions will lead to inadequate medical workup, and that her daughter is at risk of complications of missed diagnosis, which includes morbidity and mortality.  Alternative options discussed (I-stat chem 8, in and out cath) Opportunity to change mind given. Mother is demonstrating good capacity to make decision. She understands that she needs to return to the ER immediately if the daughter's symptoms get worse. Strict ER return precautions have been discussed, and mother is agreeing with the plan and is comfortable with the workup done and the recommendations from the ER.     Final Clinical Impressions(s) / ED Diagnoses   Final diagnoses:  Non-intractable vomiting with nausea, unspecified vomiting type  Moderate asthma with exacerbation, unspecified whether persistent    ED Discharge Orders        Ordered    predniSONE (DELTASONE) 10 MG tablet  Daily     07/10/17 0226    promethazine (PHENERGAN) 25 MG suppository  Every 6 hours PRN     07/10/17 0226       Derwood Kaplan, MD 07/10/17 0235

## 2017-07-10 NOTE — Telephone Encounter (Signed)
Mother took patient to ED

## 2017-07-10 NOTE — Telephone Encounter (Signed)
Congestion is likely from pollen and allergies but recommend finish course of levaquin and then return for visit and chest x-ray to determine effectiveness.

## 2017-07-10 NOTE — Discharge Instructions (Addendum)
We saw you in the ER for your asthma related complains. We gave you some breathing treatments in the ER, and seems like your symptoms have improved. Please take albuterol as needed every 4 hours. Please take the medications prescribed.  Please see a primary care doctor in 1 week. Return to the ER if your symptoms worsen.  Please return to the ER if your symptoms worsen; you have increased pain, fevers, chills, inability to keep any medications down, confusion. Otherwise see the outpatient doctor as requested.

## 2017-07-10 NOTE — Telephone Encounter (Signed)
Patient's mother says she was supposed to get a call back from [place] today. Post ED visit, patient is still vomiting and mother believes she needs another antibiotic. She believe Amijah is immune to Levaquin. Please advise.

## 2017-07-11 MED ORDER — BENZONATATE 200 MG PO CAPS: 200.0000 mg | ORAL_CAPSULE | Freq: Three times a day (TID) | ORAL | 0 refills | Status: DC | PRN

## 2017-07-11 NOTE — Addendum Note (Signed)
Addended by: Hillard DankerRAWFORD, Lovetta Condie A on: 07/11/2017 03:11 PM   Modules accepted: Orders

## 2017-07-11 NOTE — Telephone Encounter (Signed)
On the phone over 18 minutes with patients mother. Patients mother wanted to inform us of recent ED visit and find out about the daughters ABX. I informed patient of MD response of finishing ABX then making an appointment. Mother stated understanding although she disagreed because she does not think it is working. I informed mother that it was working because the chest X-ray from the hospital came back that patients lungs where looking better. Patients mother stated that daughter is done with ABX but when I asked how daughter was taking it she replied "1 a day" I informed mother that patient should not be done with medication till May 1st because she was given a 10 day supply to take 1 a day. response was "well she does have some left." Mother than went on to ask if daughter could get an Rx for the Phenergan that is not a suppository since daughter will not let her do that. And wanting to know if Tessalon pearls could be sent in for her congestion cough that she still has causing her to throw up. Please advise.

## 2017-07-11 NOTE — Telephone Encounter (Signed)
Rx for tessalon perles sent in. Already has zofran from us just refilled 07/07/17 and should use this for nausea. Rest can be discussed at visit when we can listen to her lungs and assess.

## 2017-07-14 ENCOUNTER — Other Ambulatory Visit (INDEPENDENT_AMBULATORY_CARE_PROVIDER_SITE_OTHER): Payer: Medicare Other

## 2017-07-14 DIAGNOSIS — N390 Urinary tract infection, site not specified: Secondary | ICD-10-CM | POA: Diagnosis not present

## 2017-07-14 LAB — URINALYSIS, ROUTINE W REFLEX MICROSCOPIC
BILIRUBIN URINE: NEGATIVE
Hgb urine dipstick: NEGATIVE
KETONES UR: 15 — AB
LEUKOCYTES UA: NEGATIVE
NITRITE: NEGATIVE
RBC / HPF: NONE SEEN (ref 0–?)
Specific Gravity, Urine: >=1.03 — AB (ref 1.000–1.030)
Urine Glucose: NEGATIVE
Urobilinogen, UA: 1 (ref 0.0–1.0)
pH: 6 (ref 5.0–8.0)

## 2017-07-21 ENCOUNTER — Telehealth: Payer: Self-pay

## 2017-07-21 ENCOUNTER — Emergency Department (HOSPITAL_COMMUNITY)
Admission: EM | Admit: 2017-07-21 | Discharge: 2017-07-22 | Disposition: A | Discharge status: Home / Self Care | Payer: Medicare Other | Attending: Emergency Medicine | Admitting: Emergency Medicine

## 2017-07-21 ENCOUNTER — Encounter (HOSPITAL_COMMUNITY): Payer: Self-pay

## 2017-07-21 ENCOUNTER — Other Ambulatory Visit: Payer: Medicare Other

## 2017-07-21 DIAGNOSIS — J45909 Unspecified asthma, uncomplicated: Secondary | ICD-10-CM | POA: Diagnosis not present

## 2017-07-21 DIAGNOSIS — N390 Urinary tract infection, site not specified: Secondary | ICD-10-CM | POA: Diagnosis not present

## 2017-07-21 DIAGNOSIS — Z79899 Other long term (current) drug therapy: Secondary | ICD-10-CM | POA: Diagnosis not present

## 2017-07-21 DIAGNOSIS — R350 Frequency of micturition: Secondary | ICD-10-CM | POA: Diagnosis present

## 2017-07-21 NOTE — Telephone Encounter (Signed)
FYI:  Mother states she is taking patient to the ED this afternoon for urinalysis.

## 2017-07-21 NOTE — Telephone Encounter (Signed)
Patient's mother brought urine by to the lab this morning at 7:30am and requested urinalysis/culture---dr crawford is not in the office today or tomorrow, but according to dr crawford's last note in patient's chart---patient would need to be seen in the office for further tx---last urinalysis performed showed no infection found---patient's mother advised of that today--there was also no significant infection found at the hospital encounter, either---patient's mother advised that there is no order currently in the patient's chart for any tx and patient would need to be seen today---I do have openings in the office today for the patient to be seen and the urine she brought this morning would be good until 5pm today---after that, the urine will be disgarded---patient's mother hung up on me.

## 2017-07-21 NOTE — Telephone Encounter (Signed)
Patient's mother states that there was a standing order Dr. Okey Dupre placed for patient to get a urinalysis done at any time.  Please follow up with Dr. Okey Dupre and call back in regard once she is back in the office.  Mother is going to hold off on an appointment for UTI at this point until Dr. Okey Dupre can be spoken with.

## 2017-07-22 DIAGNOSIS — N390 Urinary tract infection, site not specified: Secondary | ICD-10-CM | POA: Diagnosis not present

## 2017-07-22 LAB — CBC WITH DIFFERENTIAL/PLATELET
BASOS ABS: 0 10*3/uL (ref 0.0–0.1)
BASOS PCT: 0 %
Eosinophils Absolute: 0 10*3/uL (ref 0.0–0.7)
Eosinophils Relative: 0 %
HEMATOCRIT: 34.4 % — AB (ref 36.0–46.0)
HEMOGLOBIN: 11.1 g/dL — AB (ref 12.0–15.0)
Lymphocytes Relative: 42 %
Lymphs Abs: 5.3 10*3/uL — ABNORMAL HIGH (ref 0.7–4.0)
MCH: 29.1 pg (ref 26.0–34.0)
MCHC: 32.3 g/dL (ref 30.0–36.0)
MCV: 90.3 fL (ref 78.0–100.0)
Monocytes Absolute: 1.6 10*3/uL — ABNORMAL HIGH (ref 0.1–1.0)
Monocytes Relative: 12 %
NEUTROS ABS: 5.7 10*3/uL (ref 1.7–7.7)
NEUTROS PCT: 46 %
Platelets: 198 10*3/uL (ref 150–400)
RBC: 3.81 MIL/uL — ABNORMAL LOW (ref 3.87–5.11)
RDW: 14.7 % (ref 11.5–15.5)
WBC: 12.6 10*3/uL — AB (ref 4.0–10.5)

## 2017-07-22 LAB — URINALYSIS, ROUTINE W REFLEX MICROSCOPIC
BACTERIA UA: NONE SEEN
Glucose, UA: NEGATIVE mg/dL
Hgb urine dipstick: NEGATIVE
KETONES UR: 5 mg/dL — AB
Nitrite: NEGATIVE
Protein, ur: 100 mg/dL — AB
Specific Gravity, Urine: 1.039 — ABNORMAL HIGH (ref 1.005–1.030)
WBC, UA: >50 WBC/hpf — ABNORMAL HIGH (ref 0–5)
pH: 5 (ref 5.0–8.0)

## 2017-07-22 LAB — COMPREHENSIVE METABOLIC PANEL
ALBUMIN: 2.6 g/dL — AB (ref 3.5–5.0)
ALT: 17 U/L (ref 14–54)
AST: 20 U/L (ref 15–41)
Alkaline Phosphatase: 47 U/L (ref 38–126)
Anion gap: 9 (ref 5–15)
BILIRUBIN TOTAL: 0.5 mg/dL (ref 0.3–1.2)
BUN: 7 mg/dL (ref 6–20)
CO2: 21 mmol/L — ABNORMAL LOW (ref 22–32)
Calcium: 8.2 mg/dL — ABNORMAL LOW (ref 8.9–10.3)
Chloride: 109 mmol/L (ref 101–111)
Creatinine, Ser: 0.79 mg/dL (ref 0.44–1.00)
GFR calc Af Amer: >60 mL/min (ref >60)
GFR calc non Af Amer: >60 mL/min (ref >60)
GLUCOSE: 139 mg/dL — AB (ref 65–99)
POTASSIUM: 4.1 mmol/L (ref 3.5–5.1)
Sodium: 139 mmol/L (ref 135–145)
TOTAL PROTEIN: 5.8 g/dL — AB (ref 6.5–8.1)

## 2017-07-22 LAB — LIPASE, BLOOD: Lipase: 27 U/L (ref 11–51)

## 2017-07-22 MED ORDER — ONDANSETRON HCL 4 MG/2ML IJ SOLN
4.0000 mg | Freq: Once | INTRAMUSCULAR | Status: AC
  Administered 2017-07-22: 4 mg via INTRAVENOUS
  Filled 2017-07-22: qty 2

## 2017-07-22 MED ORDER — SULFAMETHOXAZOLE-TRIMETHOPRIM 800-160 MG PO TABS
1.0000 | ORAL_TABLET | Freq: Once | ORAL | Status: AC
  Administered 2017-07-22: 1 via ORAL
  Filled 2017-07-22: qty 1

## 2017-07-22 MED ORDER — CEPHALEXIN 250 MG PO CAPS
250.0000 mg | ORAL_CAPSULE | Freq: Once | ORAL | Status: AC
Start: 2017-07-22 — End: 2017-07-22
  Administered 2017-07-22: 250 mg via ORAL
  Filled 2017-07-22: qty 1

## 2017-07-22 MED ORDER — SULFAMETHOXAZOLE-TRIMETHOPRIM 800-160 MG PO TABS: 1.0000 | ORAL_TABLET | Freq: Two times a day (BID) | ORAL | 0 refills | Status: AC

## 2017-07-22 MED ORDER — CEPHALEXIN 500 MG PO CAPS: 500.0000 mg | ORAL_CAPSULE | Freq: Two times a day (BID) | ORAL | 0 refills | Status: DC

## 2017-07-22 MED ORDER — SODIUM CHLORIDE 0.9 % IV BOLUS
1000.0000 mL | Freq: Once | INTRAVENOUS | Status: AC
  Administered 2017-07-22: 1000 mL via INTRAVENOUS

## 2017-07-22 NOTE — ED Notes (Signed)
Bed: WA22 Expected date:  Expected time:  Means of arrival:  Comments: Rm 8 

## 2017-07-22 NOTE — ED Notes (Signed)
Pt complains of UTI symptoms that have continued after antibiotics, pt also has a hx of pneumonia

## 2017-07-22 NOTE — ED Provider Notes (Signed)
[facility]-EMERGENCY DEPT Provider Note   CSN: [id number] Arrival date & time: 07/21/17  2049     History   Chief Complaint Chief Complaint  Patient presents with  . Urinary Frequency    HPI Lori Miles is a 21 y.o. female.  Patient brought to the emergency department by mother for evaluation of possible urinary tract infection.  Mother reports that the patient has a history of recurrent urinary tract infection.  She has been incontinent of urine which is unusual.  Patient has autism, cannot provide much further information.  Mother has not noticed fever.  She has had recurrent nausea and vomiting, mother feels that she might be dehydrated. Level V Caveat due to autism.     Past Medical History:  Diagnosis Date  . Asthma   . Autism    mom states no actual dx, but admits she has some symptoms  . Bacteriuria   . Chronic constipation   . Cognitive developmental delay   . Disruptive behavior disorder   . Family history of adverse reaction to anesthesia    mother had itching after anesthesia one time  . Frequency-urgency syndrome   . Gait disorder    ORGANIC PER NERUOLGIST NOTE (DR HICKLING)  . 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 GERD (gastroesophageal reflux disease)   . Gout   . H/O sinus tachycardia   . History of acute respiratory failure 02/14/2015   SECONDARY TO CAP  . History of recurrent UTIs   . Interstitial cystitis   . Moderate intellectual disability   . OSA (obstructive sleep apnea)    no machine yet   . Partial epilepsy with impairment of consciousness (HCC)    FOLLOWED BY DR HICKLING  . Pneumonia 2018 last time  . PONV (postoperative nausea and vomiting)   . Seizure (HCC)    most recent sz " at the end of summer"-last sz years ago per mother  . Urinary incontinence     Patient Active Problem List   Diagnosis Date Noted  . Allergic rhinitis 04/03/2017  . Recurrent falls 03/21/2017  . Weakness  03/21/2017  . Venous insufficiency 03/21/2017  . Obstructive sleep apnea 09/30/2016  . Excessive daytime sleepiness 03/05/2016  . Gait disorder 03/22/2015  . 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 Cough variant asthma vs UACS/ vcd  03/15/2015  . Autism spectrum disorder 02/14/2015  . Nausea & vomiting 02/14/2015  . Obesity, morbid (HCC) 12/13/2014  . 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 Acanthosis nigricans 11/14/2014  . Toeing-in 08/29/2014  . 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 Recurrent urinary tract infection 08/01/2011  . Asthma 08/01/2011  . Chronic constipation 08/01/2011  . Contraception 08/01/2011    Past Surgical History:  Procedure Laterality Date  . CYSTOSCOPY N/A 06/03/2017   Procedure: CYSTOSCOPY WITH EXAM UNDER ANESTHESIA;  Surgeon: Jerilee Field, MD;  Location: [facility];  Service: Urology;  Laterality: N/A;  . CYSTOSCOPY WITH HYDRODISTENSION AND BIOPSY  04/14/2012   Procedure: CYSTOSCOPY/BIOPSY/HYDRODISTENSION;  Surgeon: Lindaann Slough, MD;  Location: [facility];  Service: Urology;;  instillation of marcaine and pyridium  . EUA/  PAP SMEAR/  BREAST EXAM  03-12-2016   dr Erin Fulling [facility]  . EUA/ VAGINOSCOPY/ COLPOSCOPY FO THE HYMENAL RING  10-04-2009  dr Tamela Oddi  [facility]  . TONSILLECTOMY       OB History   None      Home Medications    Prior to Admission medications   Medication Sig Start Date End Date Taking? Authorizing Provider  acetaminophen (TYLENOL) 325 MG tablet Take 2 tablets (650 mg total) by mouth every 6 (six) hours as needed for mild pain or moderate pain. 02/27/17  Yes Everlene Farrier, PA-C  albuterol (PROVENTIL) (2.5 MG/3ML) 0.083% nebulizer solution INHALE CONTENTS OF 1  VIAL IN NEBULIZER EVERY 4 TO 6 HOURS IF NEEDED FOR COUGH OR WHEEZING 07/07/17  Yes Kozlow, Alvira Philips, MD  benzonatate (TESSALON) 200 MG capsule Take 1 capsule (200 mg total) by mouth 3 (three) times daily as needed. Patient taking differently: Take 200 mg by mouth 3 (three) times daily as needed for cough.  07/11/17  Yes Myrlene Broker, MD  budesonide (PULMICORT) 1 MG/2ML nebulizer solution Take 2 mLs (1 mg total) by nebulization 2 (two) times daily. 03/05/17  Yes Kozlow, Alvira Philips, MD  cetirizine (ZYRTEC) 10 MG tablet TAKE 1 TABLET BY MOUTH DAILY F OR RUNNY NOSE OR ITCHING 05/12/17  Yes Kozlow, Alvira Philips, MD  cloNIDine (CATAPRES) 0.1 MG tablet TAKE 1 AND 1/2 TABLET BY MOUTH AT BEDTIME 07/07/17  Yes Goodpasture, Inetta Fermo, NP  DEPAKOTE 500 MG DR tablet Give 1 tablet in the morning and 2 tablets at night Patient taking differently: Take 500-1,000 mg by mouth See admin instructions. Give 1 tablet in the morning and 2 tablets at night 03/19/17  Yes Goodpasture, Inetta Fermo, NP  fluconazole (DIFLUCAN) 100 MG tablet Take 1 tablet (100 mg total) by mouth daily. 04/30/17  Yes Denney, Rachelle A, CNM  formoterol (PERFOROMIST) 20 MCG/2ML nebulizer solution Take 2 mLs (20 mcg total) by nebulization 2 (two) times daily. 05/08/17  Yes Kozlow, Alvira Philips, MD  hyoscyamine (LEVSIN SL) 0.125 MG SL tablet Place 0.125 mg under the tongue every 4 (four) hours as needed for cramping.    Yes [provider]  Levonorgestrel-Ethinyl Estradiol (SEASONIQUE) 0.15-0.03 &0.01 MG tablet Take 1 tablet by mouth daily.   Yes [provider]  Melatonin 3 MG TABS Take 3 mg by mouth at bedtime. For insomnia   Yes [provider]  montelukast (SINGULAIR) 10 MG tablet TAKE 1 TABLET BY MOUTH EVERY EVENING TO PREVENT COUGH OR WHEEZE 06/09/17  Yes Kozlow, Alvira Philips, MD  ondansetron (ZOFRAN-ODT) 4 MG disintegrating tablet DISSOLVE 1 TABLET(4 MG) ON THE TONGUE EVERY 8 HOURS AS NEEDED FOR NAUSEA OR VOMITING 07/07/17  Yes Myrlene Broker, MD    polyethylene glycol (MIRALAX / GLYCOLAX) packet Take 17 g by mouth 2 (two) times daily.   Yes [provider]  promethazine (PHENERGAN) 25 MG suppository Place 1 suppository (25 mg total) rectally every 6 (six) hours as needed for nausea or vomiting. 07/10/17  Yes Nanavati, Ankit, MD  ranitidine (ZANTAC) 150 MG capsule Take 1 capsule (150 mg total) 2 (two) times daily by mouth. 01/24/17  Yes Pyrtle, Carie Caddy, MD  senna (SENOKOT) 8.6 MG tablet Take 1 tablet by mouth at bedtime.    Yes [provider]  traZODone (DESYREL) 50 MG tablet Take 2 tablets (100 mg total) by mouth at bedtime. 07/07/17  Yes Elveria Rising, NP  cephALEXin (KEFLEX) 500 MG capsule Take 1 capsule (500 mg total) by mouth 2 (two) times daily. 07/22/17   Gilda Crease, MD  levofloxacin (LEVAQUIN) 750 MG tablet Take 1 tablet (750 mg total) by  mouth daily. Patient not taking: Reported on 07/22/2017 07/07/17   Azalia Bilis, MD  predniSONE (DELTASONE) 10 MG tablet Take 6 tablets (60 mg total) by mouth daily. Patient not taking: Reported on 07/22/2017 07/10/17   Derwood Kaplan, MD  sulfamethoxazole-trimethoprim (BACTRIM DS,SEPTRA DS) 800-160 MG tablet Take 1 tablet by mouth 2 (two) times daily for 7 days. 07/22/17 07/29/17  Gilda Crease, MD    Family History Family History  Problem Relation Age of Onset  . Seizures Mother   . Seizures Sister        1 sister has  . Pancreatic cancer Sister   . Colon cancer Neg Hx   . Colon polyps Neg Hx   . Esophageal cancer Neg Hx   . Rectal cancer Neg Hx   . Stomach cancer Neg Hx     Social History Social History   Tobacco Use  . Smoking status: Never Smoker  . Smokeless tobacco: Never Used  Substance Use Topics  . Alcohol use: No    Alcohol/week: 0.0 oz  . Drug use: No     Allergies   Abilify [aripiprazole]; Seroquel [quetiapine fumarate]; Doxycycline; Amoxicillin-pot clavulanate; and Keppra [levetiracetam]   Review of Systems Review of Systems   Unable to perform ROS: Other     Physical Exam Updated Vital Signs BP 99/64   Pulse 96   Temp 98.2 F (36.8 C) (Oral)   Resp 17   Ht  (1.448 m)   Wt 86.2 kg (190 lb)   SpO2 99%   BMI 41.12 kg/m   Physical Exam  Constitutional: She is oriented to person, place, and time. She appears well-developed and well-nourished. No distress.  HENT:  Head: Normocephalic and atraumatic.  Right Ear: Hearing normal.  Left Ear: Hearing normal.  Nose: Nose normal.  Mouth/Throat: Oropharynx is clear and moist and mucous membranes are normal.  Eyes: Pupils are equal, round, and reactive to light. Conjunctivae and EOM are normal.  Neck: Normal range of motion. Neck supple.  Cardiovascular: Regular rhythm, S1 normal and S2 normal. Exam reveals no gallop and no friction rub.  No murmur heard. Pulmonary/Chest: Effort normal and breath sounds normal. No respiratory distress. She exhibits no tenderness.  Abdominal: Soft. Normal appearance and bowel sounds are normal. There is no hepatosplenomegaly. There is no tenderness. There is no rebound, no guarding, no tenderness at McBurney's point and negative Murphy's sign. No hernia.  Musculoskeletal: Normal range of motion.  Neurological: She is alert and oriented to person, place, and time. She has normal strength. No cranial nerve deficit or sensory deficit. Coordination normal. GCS eye subscore is 4. GCS verbal subscore is 5. GCS motor subscore is 6.  Skin: Skin is warm, dry and intact. No rash noted. No cyanosis.  Psychiatric: She has a normal mood and affect. Her speech is normal and behavior is normal. Thought content normal.  Nursing note and vitals reviewed.    ED Treatments / Results  Labs (all labs ordered are listed, but only abnormal results are displayed) Labs Reviewed  URINALYSIS, ROUTINE W REFLEX MICROSCOPIC - Abnormal; Notable for the following components:      Result Value   Color, Urine AMBER (*)    APPearance CLOUDY (*)     Specific Gravity, Urine 1.039 (*)    Bilirubin Urine MODERATE (*)    Ketones, ur 5 (*)    Protein, ur 100 (*)    Leukocytes, UA MODERATE (*)    WBC, UA >50 (*)  Non Squamous Epithelial 0-5 (*)    All other components within normal limits  CBC WITH DIFFERENTIAL/PLATELET - Abnormal; Notable for the following components:   WBC 12.6 (*)    RBC 3.81 (*)    Hemoglobin 11.1 (*)    HCT 34.4 (*)    Lymphs Abs 5.3 (*)    Monocytes Absolute 1.6 (*)    All other components within normal limits  COMPREHENSIVE METABOLIC PANEL - Abnormal; Notable for the following components:   CO2 21 (*)    Glucose, Bld 139 (*)    Calcium 8.2 (*)    Total Protein 5.8 (*)    Albumin 2.6 (*)    All other components within normal limits  URINE CULTURE  LIPASE, BLOOD  CBC WITH DIFFERENTIAL/PLATELET    EKG None  Radiology No results found.  Procedures Procedures (including critical care time)  Medications Ordered in ED Medications  cephALEXin (KEFLEX) capsule 250 mg (has no administration in time range)  sulfamethoxazole-trimethoprim (BACTRIM DS,SEPTRA DS) 800-160 MG per tablet 1 tablet (has no administration in time range)  ondansetron (ZOFRAN) injection 4 mg (4 mg Intravenous Given 07/22/17 0022)  sodium chloride 0.9 % bolus 1,000 mL (0 mLs Intravenous Stopped 07/22/17 0230)     Initial Impression / Assessment and Plan / ED Course  I have reviewed the triage vital signs and the nursing notes.  Pertinent labs & imaging results that were available during my care of the patient were reviewed by me and considered in my medical decision making (see chart for details).     Patient brought to the emergency department for evaluation of nausea, vomiting and urinary incontinence.  Patient has a history of recurrent urinary tract infections.  Urinalysis today does suggest infection.  Reviewing recent culture from March 27, patient grew Klebsiella and Proteus.  Reviewing the sensitivities, there was no single  agent to cover both.  She was sent home on Keflex and Bactrim was added after culture results were available.  This was discussed with on-call pharmacy.  It is recommended that the patient be covered empirically for both agents once again.  Patient will therefore be given a prescription for 7-day course of Keflex and Bactrim.  Follow-up with PCP.  Final Clinical Impressions(s) / ED Diagnoses   Final diagnoses:  Urinary tract infection without hematuria, site unspecified    ED Discharge Orders        Ordered    sulfamethoxazole-trimethoprim (BACTRIM DS,SEPTRA DS) 800-160 MG tablet  2 times daily     07/22/17 0506    cephALEXin (KEFLEX) 500 MG capsule  2 times daily     07/22/17 0506       Gilda Crease, MD 07/22/17 [id number]

## 2017-07-23 LAB — URINE CULTURE: Culture: <10000 — AB

## 2017-07-28 ENCOUNTER — Ambulatory Visit (INDEPENDENT_AMBULATORY_CARE_PROVIDER_SITE_OTHER)
Admission: RE | Admit: 2017-07-28 | Discharge: 2017-07-28 | Disposition: A | Discharge status: Home / Self Care | Payer: Medicare Other | Source: Ambulatory Visit | Attending: Internal Medicine | Admitting: Internal Medicine

## 2017-07-28 ENCOUNTER — Encounter: Payer: Self-pay | Admitting: Internal Medicine

## 2017-07-28 ENCOUNTER — Ambulatory Visit (INDEPENDENT_AMBULATORY_CARE_PROVIDER_SITE_OTHER): Payer: Medicare Other | Admitting: Internal Medicine

## 2017-07-28 ENCOUNTER — Other Ambulatory Visit: Payer: Medicare Other

## 2017-07-28 VITALS — BP 124/68 | HR 118 | Temp 97.5°F | Ht <= 58 in | Wt 190.0 lb

## 2017-07-28 DIAGNOSIS — R112 Nausea with vomiting, unspecified: Secondary | ICD-10-CM | POA: Diagnosis not present

## 2017-07-28 DIAGNOSIS — R300 Dysuria: Secondary | ICD-10-CM

## 2017-07-28 DIAGNOSIS — N390 Urinary tract infection, site not specified: Secondary | ICD-10-CM

## 2017-07-28 LAB — POC URINALSYSI DIPSTICK (AUTOMATED)
Bilirubin, UA: NEGATIVE
Glucose, UA: NEGATIVE
Ketones, UA: NEGATIVE
Nitrite, UA: NEGATIVE
PROTEIN UA: 15
RBC UA: NEGATIVE
UROBILINOGEN UA: 0.2 U/dL
pH, UA: 6 (ref 5.0–8.0)

## 2017-07-28 MED ORDER — ONDANSETRON 4 MG PO TBDP: ORAL_TABLET | ORAL | 1 refills | Status: DC

## 2017-07-28 NOTE — Patient Instructions (Signed)
We are checking the urine and x-ray today and will call you back later today if we need to change the plan with the antibiotics.   Until you hear from [name] keep taking the medicines as prescribed.

## 2017-07-28 NOTE — Assessment & Plan Note (Signed)
Checking U/A and culture today and currently on antibiotics keflex and bactrim.

## 2017-07-28 NOTE — Assessment & Plan Note (Signed)
Concern for urinary symptoms underlying given history. Prior culture without significant growth. U/A done in the office which is unchanged from ER. Checking urine culture today. Checking CXR for atypical pneumonia symptoms as the patient is not verbal. For now continue bactrim and keflex. Zofran ODT given in the office and refilled today.

## 2017-07-28 NOTE — Progress Notes (Signed)
   Subjective:    Patient ID: Lori Miles, female    DOB: June 18, 1995, 22 y.o.   MRN: [id number]  HPI The patient is a 22 YO female coming in for possible UTI. She is still on keflex and bactrim from the ER for possible infection. Culture later grew only insignificant amount of bacteria. She has previously had many different antibiotics. She is still working with 2 different urologists about what to do to prevent these UTIs. She is having some fevers and chills at home per mom. She is not able to verbalize and tell any symptoms. Still having dark urine at home. Moving bowels normally. Is nausea and vomiting which is typically a sign of UTI per mom. She has been using breathing treatments about as usual. She has been taking the antibiotics and her mom does not feel like they are working.   Review of Systems  Unable to perform ROS: Patient nonverbal      Objective:   Physical Exam  Constitutional: She appears well-developed and well-nourished.  HENT:  Head: Normocephalic and atraumatic.  Eyes: EOM are normal.  Neck: Normal range of motion.  Cardiovascular: Normal rate and regular rhythm.  Pulmonary/Chest: Effort normal and breath sounds normal. No respiratory distress. She has no wheezes. She has no rales.  Abdominal: Soft. Bowel sounds are normal. She exhibits no distension. There is no tenderness. There is no rebound.  Vomiting during visit, abdomen non-tender  Musculoskeletal: She exhibits no edema.  Neurological: Coordination normal.  Skin: Skin is warm and dry.   Vitals:   07/28/17 0851  BP: 124/68  Pulse: (!) 118  Temp: (!) 97.5 F (36.4 C)  TempSrc: Oral  SpO2: 96%  Weight: 190 lb (86.2 kg)  Height:  (1.448 m)   Zofran ODT given in office    Assessment & Plan:

## 2017-07-29 LAB — URINE CULTURE
MICRO NUMBER:: 90579797
RESULT: NO GROWTH
SPECIMEN QUALITY:: ADEQUATE

## 2017-07-30 ENCOUNTER — Telehealth: Payer: Self-pay | Admitting: Internal Medicine

## 2017-07-30 DIAGNOSIS — R1110 Vomiting, unspecified: Secondary | ICD-10-CM

## 2017-07-30 NOTE — Telephone Encounter (Signed)
Copied from CRM [id number]. Topic: Quick Communication - See Telephone Encounter >> Jul 30, 2017 10:59 AM Oneal Grout wrote: CRM for notification. See Telephone encounter for: 07/30/17. Mother calling wanting to let Dr Okey Dupre know she is still getting sick, last time was last night. Did finish antibiotics. Please advise. Also is requesting results of xray and lab work

## 2017-07-30 NOTE — Telephone Encounter (Signed)
FYI Mother states that patient has not thrown up today so I informed her to keep an eye on patient and let us know if the nausea and vomiting does not get better, but it sounds like she is doing okay today. Patient's mother stated understanding

## 2017-07-31 ENCOUNTER — Other Ambulatory Visit (INDEPENDENT_AMBULATORY_CARE_PROVIDER_SITE_OTHER): Payer: Self-pay | Admitting: Family

## 2017-07-31 DIAGNOSIS — G4700 Insomnia, unspecified: Secondary | ICD-10-CM

## 2017-07-31 MED ORDER — CLONIDINE HCL 0.1 MG PO TABS: ORAL_TABLET | ORAL | 1 refills | Status: DC

## 2017-07-31 MED ORDER — TRAZODONE HCL 50 MG PO TABS: 100.0000 mg | ORAL_TABLET | Freq: Every day | ORAL | 1 refills | Status: DC

## 2017-07-31 NOTE — Telephone Encounter (Signed)
She does not have a current UTI, the culture showed no growth per recent result note. The retching is likely coming from another cause or source. We have put in another order for urine test if her mom wants to get this rechecked. She can take the nausea medicine.

## 2017-07-31 NOTE — Telephone Encounter (Signed)
Patient's mother calling and states that she is still throwing up. States that she thinks the patient still has a UTI. States that the patient is resistant to some antibiotics. Would like to know what the next steps are? Please call back. CB#: [phone]

## 2017-07-31 NOTE — Telephone Encounter (Signed)
Gave patients mother MD response. And states that if we look in the records that she always has to have extra ABX because the puking is coming from the UTI "she knows her daughter" that even if it does not show an infection that she still does have it. States that it is in her chart that she resists medicines and patient is still wetting her clothes so she knows she is still having UTI issues. I informed patients mother that if there is nothing showing in the culture that an ABX would not be sent in but that I would send the message to Dr. Okey Dupre anyway. Patients mother informed that another order was placed for urine test if she wants to do that. Patients mother stated that she might take her to the ER to get an IV since that is usually what helps. Was also informed to have patient continue the nausea medicine and mother states she will but it is not working.

## 2017-08-04 ENCOUNTER — Telehealth: Payer: Self-pay | Admitting: Internal Medicine

## 2017-08-04 NOTE — Telephone Encounter (Signed)
Copied from CRM [id number]. Topic: Quick Communication - See Telephone Encounter >> Aug 04, 2017 11:39 AM Landry Mellow wrote: CRM for notification. See Telephone encounter for: 08/04/17. Mom called - she said that even though there is an order,mom can not bring pt to the office to have urine labs done,  she is for sure that pt has a uti.  Pt has fever, chills, vomiting, and is wetting her self.  Mom is asking for abx to be called into [facility] on [address]

## 2017-08-05 ENCOUNTER — Other Ambulatory Visit (INDEPENDENT_AMBULATORY_CARE_PROVIDER_SITE_OTHER): Payer: Medicare Other

## 2017-08-05 DIAGNOSIS — R1110 Vomiting, unspecified: Secondary | ICD-10-CM

## 2017-08-05 LAB — URINALYSIS, ROUTINE W REFLEX MICROSCOPIC
Hgb urine dipstick: NEGATIVE
Nitrite: NEGATIVE
SPECIFIC GRAVITY, URINE: 1.02 (ref 1.000–1.030)
UROBILINOGEN UA: 4 — AB (ref 0.0–1.0)
Urine Glucose: NEGATIVE
pH: 8 (ref 5.0–8.0)

## 2017-08-05 NOTE — Telephone Encounter (Signed)
I am aware but she would need to have a urine sample done before an antibiotic will be given since it is not responsible to treat an infection when there is not one. No bacteria at all grew from the last sample. We have put in orders for another sample and if evidence of bacteria we will treat her appropriately. There is a lot of harm from antibiotics when they are not needed.

## 2017-08-05 NOTE — Telephone Encounter (Signed)
Patient's mother refuses to believe that dr Okey Dupre has reviewed all notes for patient's previous condition  Mother feels dr Okey Dupre should already know that lots of times in the past, culture shows no infection but patient ends up needing abx--she wants to avoid IV abx at hospital and having to take patient to ED---And she wants dr Okey Dupre to call her personally--routing to dr crawford ---please advise, thanks

## 2017-08-05 NOTE — Telephone Encounter (Signed)
I have verified that future orders have been placed for urinalysis and culture at our lab

## 2017-08-05 NOTE — Telephone Encounter (Signed)
Patient's mother has been advised that per brianna and per dr crawford---order has been placed at [facility] for patient to bring in urine sample---dr crawford will not prescribe abx without sample and culture showing need for abx usage

## 2017-08-05 NOTE — Telephone Encounter (Signed)
Can you please call patients mother and let her know that nothing will be sent in because the last urine culture came back clear and without a new sample with something proving she has an infection we cannot send anything in.

## 2017-08-05 NOTE — Telephone Encounter (Signed)
Patient mother calling and states that she is going to bring a urine sample to the office. States it is very yellow.

## 2017-08-08 ENCOUNTER — Other Ambulatory Visit: Payer: Self-pay | Admitting: Internal Medicine

## 2017-08-08 ENCOUNTER — Telehealth: Payer: Self-pay

## 2017-08-08 LAB — URINE CULTURE
MICRO NUMBER:: 90616496
SPECIMEN QUALITY:: ADEQUATE

## 2017-08-08 MED ORDER — CEPHALEXIN 500 MG PO CAPS: 500.0000 mg | ORAL_CAPSULE | Freq: Two times a day (BID) | ORAL | 0 refills | Status: DC

## 2017-08-08 MED ORDER — FOSFOMYCIN TROMETHAMINE 3 G PO PACK: 3.0000 g | PACK | Freq: Once | ORAL | 0 refills | Status: AC

## 2017-08-08 NOTE — Telephone Encounter (Signed)
Pt mother called in and said that ins will not cover this med so she used a coupon card and just paid out of pocket

## 2017-08-08 NOTE — Telephone Encounter (Signed)
PA started on CoverMyMeds KEY: [id number]

## 2017-08-12 ENCOUNTER — Telehealth: Payer: Self-pay | Admitting: Internal Medicine

## 2017-08-12 DIAGNOSIS — R112 Nausea with vomiting, unspecified: Secondary | ICD-10-CM

## 2017-08-12 NOTE — Telephone Encounter (Signed)
Patients mother informed of MD response and stated understanding

## 2017-08-12 NOTE — Telephone Encounter (Signed)
She should stop keflex and fosfomycin is only 1 packet so refill of that is not appropriate. She should come back for U/A and culture about 5 days after taking the packet. Orders at the lab already.

## 2017-08-12 NOTE — Telephone Encounter (Signed)
Copied from CRM [id number]. Topic: General - Other >> Aug 12, 2017  8:43 AM Leafy Ro wrote: Reason for CRM: pt was only given one packet of monurol instead of 3 packet. Pt mom has started keflex today and pt is wetting a lot. Pt mom would like to know should she finished the keflex. Pt mom would like 2 packets of monurol also. Pharm [facility]  [address]

## 2017-08-12 NOTE — Telephone Encounter (Signed)
Called patients mother and informed her it was 1 packet that last in the body for three days and was supposed to stop the keflex once she got the powder. Patient was given the powder over the weekend and mother states that today which is day 3 patient is still wetting everywhere and it is strong smelling wanting to know if she can get another packet of the [facility] and if she should take the keflex. She gave patient one keflex today.

## 2017-08-13 ENCOUNTER — Telehealth: Payer: Self-pay | Admitting: Internal Medicine

## 2017-08-13 NOTE — Telephone Encounter (Signed)
Copied from CRM [id number]. Topic: Quick Communication - See Telephone Encounter >> Aug 13, 2017  3:56 PM Lorrine Kin, NT wrote: CRM for notification. See Telephone encounter for: 08/13/17. Patient's mother calling and is requesting a call back from Dr Frutoso Chase nurse. Please advise. States she is wanting to discuss the other doctors options and what would be best for the patient. CB#: [phone]

## 2017-08-13 NOTE — Telephone Encounter (Signed)
Patient needs to wait four days and do a repeat urinalysis that has already been placed and can go directly to the lab to do that. As far as discussing another visit with another doctor and options for patient a visit would need to be made and those office notes would need to be brought to visit.  Routing to [facility] CRM Created

## 2017-08-15 NOTE — Telephone Encounter (Signed)
Pt's mom would like to speak with Lori Miles again about something she has found out. She stated she would let Lori Miles know what is going on. CB# [phone].

## 2017-08-20 ENCOUNTER — Other Ambulatory Visit: Payer: Self-pay | Admitting: Certified Nurse Midwife

## 2017-08-20 ENCOUNTER — Telehealth: Payer: Self-pay | Admitting: Internal Medicine

## 2017-08-20 DIAGNOSIS — B379 Candidiasis, unspecified: Secondary | ICD-10-CM

## 2017-08-20 DIAGNOSIS — T3695XA Adverse effect of unspecified systemic antibiotic, initial encounter: Principal | ICD-10-CM

## 2017-08-20 NOTE — Telephone Encounter (Signed)
Copied from CRM [id number]. Topic: General - Other >> Aug 20, 2017  6:50 PM Stephannie LiSimmons, Jonnie Truxillo L, NT wrote: Reason for CRM: Patients mom called to follow up with previous request for a return call from someone from practice , please call her at [phone] as soon as possible ,

## 2017-08-21 ENCOUNTER — Telehealth: Payer: Self-pay | Admitting: Allergy and Immunology

## 2017-08-21 ENCOUNTER — Other Ambulatory Visit: Payer: Self-pay | Admitting: Obstetrics

## 2017-08-21 ENCOUNTER — Other Ambulatory Visit (INDEPENDENT_AMBULATORY_CARE_PROVIDER_SITE_OTHER): Payer: Medicare Other

## 2017-08-21 DIAGNOSIS — R112 Nausea with vomiting, unspecified: Secondary | ICD-10-CM

## 2017-08-21 LAB — URINALYSIS, ROUTINE W REFLEX MICROSCOPIC
BILIRUBIN URINE: NEGATIVE
Bacteria, UA: NONE SEEN
HGB URINE DIPSTICK: NEGATIVE
NITRITE: NEGATIVE
Specific Gravity, Urine: 1.025 (ref 1.000–1.030)
Total Protein, Urine: NEGATIVE
URINE GLUCOSE: NEGATIVE
UROBILINOGEN UA: 1 (ref 0.0–1.0)
pH: 6 (ref 5.0–8.0)

## 2017-08-21 NOTE — Telephone Encounter (Signed)
Mom is calling about patient Patient is having UTI issues and they are seeing several physicians at the moment Patient will come to appts when she is well enough and feeling better Mom will call in and give updates on the patient as needed.  This is an FYI to [facility]

## 2017-08-21 NOTE — Telephone Encounter (Signed)
Routing to dr crawford---patient's mother is aware dr Okey Duprecrawford is out of office this week, but would like dr crawford to know that patient's neurologist does not think going[facility] is good idea due to patient's seizures---ms. Lard is asking if there's someone else that is a specialist she can see locally---please advise, I will call her back

## 2017-08-21 NOTE — Telephone Encounter (Signed)
Noted  

## 2017-08-22 LAB — URINE CULTURE
MICRO NUMBER:: 90681484
SPECIMEN QUALITY:: ADEQUATE

## 2017-08-27 ENCOUNTER — Telehealth: Payer: Self-pay | Admitting: Internal Medicine

## 2017-08-27 NOTE — Telephone Encounter (Signed)
She has already seen [facility] and the mother was not happy with provider there. There is urology in [place] instead if mother wants to go there. The mother has good relationship with the [facility] provider and I would probably recommend to continue going there if possible.

## 2017-08-27 NOTE — Telephone Encounter (Signed)
Copied from CRM [id number]. Topic: Quick Communication - See Telephone Encounter >> Aug 27, 2017  7:57 AM Oneal GroutSebastian, Jennifer S wrote: CRM for notification. See Telephone encounter for: 08/27/17. Patient mother calling, still having symptoms of UTI, still peeing frequently, wetting pull up at night, pull and tugging in pain at private area. Does not want believe test results. Would like providers advice. Requesting call back from provider

## 2017-08-27 NOTE — Telephone Encounter (Signed)
This [facility] and routed to Dr. Okey Duprerawford. Patient will not be getting an ABX if there is not infection found.

## 2017-09-01 ENCOUNTER — Other Ambulatory Visit: Payer: Self-pay | Admitting: Allergy and Immunology

## 2017-09-01 ENCOUNTER — Telehealth: Payer: Self-pay | Admitting: Internal Medicine

## 2017-09-01 DIAGNOSIS — N390 Urinary tract infection, site not specified: Secondary | ICD-10-CM

## 2017-09-01 NOTE — Telephone Encounter (Signed)
Order placed for sample.  

## 2017-09-01 NOTE — Telephone Encounter (Signed)
Copied from CRM [id number]. Topic: Quick Communication - See Telephone Encounter >> Sep 01, 2017  8:56 AM Louie BunPalacios Medina, [name] wrote: CRM for notification. See Telephone encounter for: 09/01/17. Mom called and would like to talk to Dr. Okey Duprerawford or her CMA about pt possible UTI. She wants for Dr. Okey Duprerawford to put in a order for her to take a order for urine sample. Please call mom back, thanks.

## 2017-09-01 NOTE — Telephone Encounter (Signed)
Patient's mother informed

## 2017-09-02 ENCOUNTER — Other Ambulatory Visit (INDEPENDENT_AMBULATORY_CARE_PROVIDER_SITE_OTHER): Payer: Medicare Other

## 2017-09-02 DIAGNOSIS — N390 Urinary tract infection, site not specified: Secondary | ICD-10-CM

## 2017-09-02 LAB — URINALYSIS, ROUTINE W REFLEX MICROSCOPIC
Bilirubin Urine: NEGATIVE
Leukocytes, UA: NEGATIVE
Nitrite: NEGATIVE
PH: 5.5 (ref 5.0–8.0)
Total Protein, Urine: NEGATIVE
UROBILINOGEN UA: 1 (ref 0.0–1.0)
Urine Glucose: NEGATIVE

## 2017-09-03 ENCOUNTER — Other Ambulatory Visit (INDEPENDENT_AMBULATORY_CARE_PROVIDER_SITE_OTHER): Payer: Self-pay | Admitting: Family

## 2017-09-03 ENCOUNTER — Other Ambulatory Visit: Payer: Self-pay | Admitting: Internal Medicine

## 2017-09-03 ENCOUNTER — Other Ambulatory Visit: Payer: Self-pay | Admitting: Allergy and Immunology

## 2017-09-03 DIAGNOSIS — G40309 Generalized idiopathic epilepsy and epileptic syndromes, not intractable, without status epilepticus: Secondary | ICD-10-CM

## 2017-09-03 DIAGNOSIS — G40209 Localization-related (focal) (partial) symptomatic epilepsy and epileptic syndromes with complex partial seizures, not intractable, without status epilepticus: Secondary | ICD-10-CM

## 2017-09-03 LAB — URINE CULTURE
MICRO NUMBER:: 90728074
SPECIMEN QUALITY: ADEQUATE

## 2017-09-03 NOTE — Telephone Encounter (Signed)
Mom called to let us know that she dropped off urine yesterday, just wanted provider to be aware  No cb needed

## 2017-09-04 ENCOUNTER — Ambulatory Visit (INDEPENDENT_AMBULATORY_CARE_PROVIDER_SITE_OTHER): Payer: Medicare Other | Admitting: Family

## 2017-09-04 ENCOUNTER — Telehealth: Payer: Self-pay

## 2017-09-04 ENCOUNTER — Encounter (INDEPENDENT_AMBULATORY_CARE_PROVIDER_SITE_OTHER): Payer: Self-pay | Admitting: Family

## 2017-09-04 ENCOUNTER — Other Ambulatory Visit: Payer: Self-pay | Admitting: *Deleted

## 2017-09-04 VITALS — BP 110/80 | HR 84 | Ht <= 58 in | Wt 188.0 lb

## 2017-09-04 DIAGNOSIS — R296 Repeated falls: Secondary | ICD-10-CM | POA: Diagnosis not present

## 2017-09-04 DIAGNOSIS — G40209 Localization-related (focal) (partial) symptomatic epilepsy and epileptic syndromes with complex partial seizures, not intractable, without status epilepticus: Secondary | ICD-10-CM

## 2017-09-04 DIAGNOSIS — G40309 Generalized idiopathic epilepsy and epileptic syndromes, not intractable, without status epilepticus: Secondary | ICD-10-CM | POA: Diagnosis not present

## 2017-09-04 DIAGNOSIS — R269 Unspecified abnormalities of gait and mobility: Secondary | ICD-10-CM

## 2017-09-04 DIAGNOSIS — F819 Developmental disorder of scholastic skills, unspecified: Secondary | ICD-10-CM

## 2017-09-04 MED ORDER — CETIRIZINE HCL 10 MG PO TABS: ORAL_TABLET | ORAL | 0 refills | Status: DC

## 2017-09-04 MED ORDER — MONTELUKAST SODIUM 10 MG PO TABS: ORAL_TABLET | ORAL | 0 refills | Status: DC

## 2017-09-04 MED ORDER — DEPAKOTE 500 MG PO TBEC: DELAYED_RELEASE_TABLET | ORAL | 5 refills | Status: DC

## 2017-09-04 NOTE — Telephone Encounter (Signed)
Patient's mother advised of negative culture results, mother still insistent that patient has infection--she already has appt to see/talk with dr Okey Duprecrawford on Monday 6/24, mother reminded of appt, she did not want another appt any sooner---pt's mother wanted to let dr crawford know, she has worked it out for patient to see another [facility] so that the patient won't have to travel too far with seizure tendancies---routing to dr Okey Duprecrawford, [name] Childesfyi.Marland Kitchen..Marland Kitchen

## 2017-09-04 NOTE — Progress Notes (Signed)
Patient: Lori Miles MRN: [id number] Sex: female DOB: 02/21/1996  Provider: Elveria Risingina Emani Morad, NP [facility]  Note type: Routine return visit  History of Present Illness: Referral Source: Fleet ContrasEdwin Avbuere, MD History from: mother, patient and [facility] chart Chief Complaint: Epilepsy  Lori Miles is a 22 y.o. young woman with history of generalized convulsive and partial epilepsy, intellectual disabilities and problems with sleep. She was last seen March 27, 2017. Lori Miles is taking and tolerating Depakote for her seizure disorder and has remained seizure free since her last visit. She has been having frequent respiratory and urinary tract infections. Mom feels that Lori Miles currently has a urinary tract infection because she is urinating frequently and seems to be uncomfortable. She has contacted her PCP about that.   Lori Miles is overweight, and Mom has been working with her to drink more water and less sweet tea and soft drinks. She has asked her workers to give her water as well and has asked them to give her smaller food portions and snacks. Lori Miles has insomnia and Mom has difficulty getting her to sleep as well as staying asleep. She frequently has sleepiness during the day and has to nap in order to continue activities.  Lori Miles can have jerking behaviors and increased difficulty walking when she has an infection but those symptoms are not present at this time. Mom has noted that [facility] has decreased endurance and stamina when walking, and fatigues quickly. Mom says that Lori Miles falls at times when walking more than a few minutes or for any distance.  Lori Miles has been otherwise healthy since she was last seen. Mom has no other health concerns for [facility] today other than previously mentioned.   Review of Systems: Please see the HPI for neurologic and other pertinent review of systems. Otherwise, all other systems were reviewed and were negative.     Past Medical History:  Diagnosis Date  . Asthma   . Autism    mom states no actual dx, but admits she has some symptoms  . Bacteriuria   . Chronic constipation   . Cognitive developmental delay   . Disruptive behavior disorder   . Family history of adverse reaction to anesthesia    mother had itching after anesthesia one time  . Frequency-urgency syndrome   . Gait disorder    ORGANIC PER NERUOLGIST NOTE (DR HICKLING)  . 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 GERD (gastroesophageal reflux disease)   . Gout   . H/O sinus tachycardia   . History of acute respiratory failure 02/14/2015   SECONDARY TO CAP  . History of recurrent UTIs   . Interstitial cystitis   . Moderate intellectual disability   . OSA (obstructive sleep apnea)    no machine yet   . Partial epilepsy with impairment of consciousness (HCC)    FOLLOWED BY DR HICKLING  . Pneumonia 2018 last time  . PONV (postoperative nausea and vomiting)   . Seizure (HCC)    most recent sz " at the end of summer"-last sz years ago per mother  . Urinary incontinence    Hospitalizations: No., Head Injury: No., Nervous System Infections: No., Immunizations up to date: Yes.   Past Medical History Comments: EEG on September 14, 2011, was normal. She was able to be successfully weaned off Keppra. The patient has significant issues with aggression, which was thought to be related to Keppra, but has persisted once it was discontinued. The patient was treated with  Depakote both for behavior and possible seizures. She had palpitations and severe constipation on Seroquel. She had marked weight gain on Abilify and Depakote. When generic Divalproex was tried she had breakthrough seizures.   Nocturnal polysomnogram on October 27, 2011, failed to show significant periodic limb movements, cyanosis, sleep apnea, or cardiac arrhythmia. EEG on February 20, 2012, was a normal study with the patient awake.   EEG performed  March 25, 2017 was normal  Surgical History Past Surgical History:  Procedure Laterality Date  . CYSTOSCOPY N/A 06/03/2017   Procedure: CYSTOSCOPY WITH EXAM UNDER ANESTHESIA;  Surgeon: Jerilee Field, MD;  Location: [facility];  Service: Urology;  Laterality: N/A;  . CYSTOSCOPY WITH HYDRODISTENSION AND BIOPSY  04/14/2012   Procedure: CYSTOSCOPY/BIOPSY/HYDRODISTENSION;  Surgeon: Lindaann Slough, MD;  Location: [facility];  Service: Urology;;  instillation of marcaine and pyridium  . EUA/  PAP SMEAR/  BREAST EXAM  03-12-2016   dr Erin Fulling [facility]  . EUA/ VAGINOSCOPY/ COLPOSCOPY FO THE HYMENAL RING  10-04-2009    dr Tamela Oddi  [facility]  . TONSILLECTOMY      Family History family history includes Pancreatic cancer in her sister; Seizures in her mother and sister. Family History is otherwise negative for migraines, seizures, cognitive impairment, blindness, deafness, birth defects, chromosomal disorder, autism.  Social History Social History   Socioeconomic History  . Marital status: Single    Spouse name: Not on file  . Number of children: Not on file  . Years of education: Not on file  . Highest education level: Not on file  Occupational History  . Not on file  Social Needs  . Financial resource strain: Not on file  . Food insecurity:    Worry: Not on file    Inability: Not on file  . Transportation needs:    Medical: Not on file    Non-medical: Not on file  Tobacco Use  . Smoking status: Never Smoker  . Smokeless tobacco: Never Used  Substance and Sexual Activity  . Alcohol use: No    Alcohol/week: 0.0 oz  . Drug use: No  . Sexual activity: Never    Birth control/protection: Pill  Lifestyle  . Physical activity:    Days per week: Not on file    Minutes per session: Not on file  . Stress: Not on file  Relationships  . Social connections:    Talks on phone: Not on file    Gets together: Not on file    Attends religious service: Not  on file    Active member of club or organization: Not on file    Attends meetings of clubs or organizations: Not on file    Relationship status: Not on file  Other Topics Concern  . Not on file  Social History Narrative   Lives with mom ([name] Speth) and half-sister Keidy Thurgood) who is also mentally impaired.  Has CAP assistance, urinary incontinence, needs help with feeding,dressing, toileting   Graduated from [employer].  She enjoys playing with cars, dancing, and dogs.    Allergies Allergies  Allergen Reactions  . Abilify [Aripiprazole] Swelling and Palpitations  . Seroquel [Quetiapine Fumarate] Palpitations  . Doxycycline Other (See Comments)    Stomach cramps  . Amoxicillin-Pot Clavulanate Hives  . Keppra [Levetiracetam] Other (See Comments)    Aggressive behavior     Physical Exam BP 110/80   Pulse 84   Ht 4\' 9"  (1.448 m)   Wt 188 lb (85.3 kg)  BMI 40.68 kg/m  General: well developed, well nourished and obese young woman, seated in exam room, in no evident distress; black hair, brown eyes, right handed Head: normocephalic and atraumatic. Oropharynx benign. No dysmorphic features. Neck: supple with no carotid bruits. No focal tenderness. Cardiovascular: regular rate and rhythm, no murmurs. Respiratory: Clear to auscultation bilaterally Abdomen: Bowel sounds present all four quadrants, abdomen soft, non-tender, non-distended. Musculoskeletal: No skeletal deformities or obvious scoliosis Skin: no rashes or neurocutaneous lesions  Neurologic Exam Mental Status: Awake and fully alert.  Attention span, concentration, and fund of knowledge subnormal for age. She is impulsive and immature for her age. Language is limited and speech has dysarthria.  Able to follow very simple commands and was tolerant with examination. She needed frequent redirection to accomplish tasks. Cranial Nerves: Fundoscopic exam - red reflex present.  Unable to fully visualize fundus.   Pupils equal briskly reactive to light.  Extraocular movements full without nystagmus.  Visual fields full to confrontation.  Hearing intact and symmetric to whisper.  Facial sensation intact.  Face, tongue, palate move normally and symmetrically.  Neck flexion and extension normal. Motor: Normal functional bulk, tone and strength but she had difficulty following instructions for adequate strength testing. She has no tremor.  Sensory: Intact to touch and temperature in all extremities. Coordination: Rapid movements: finger and toe tapping fairly slow and symmetric bilaterally.  Finger-to-nose and heel-to-shin intact bilaterally.  Balance is poor. Romberg negative. Gait and Station: Arises from chair, without difficulty. Stance and stance are wide based. She was clumsy and could not perform heel, toe and tandem walk. She fatigued while performing these tasks and sat down at one point.  Reflexes: Diminished and symmetric. Toes downgoing. No clonus.  Impression 1.  Partial epilepsy with impairment of consciousness 2.  Generalized convulsive epilepsy 3.  Insomnia and sleep arousals 4.  Intellectual disability 5.  Gait disorder 6.  At risk for falls  Recommendations for plan of care The patient's previous [facility] records were reviewed. Lori Miles has neither had nor required imaging or lab studies since the last visit other than what has been performed at the ER or by her PCP. Mom is aware of all these results. She is a 22 year old young woman with history of generalized convulsive and partial epilepsy, intellectual disability, insomnia and sleep arousals, as well as gait disorder. She is taking and tolerating Depakote for her seizure disorder and has remained seizure free since her last visit. Lori Miles has problems with walking as well as decreased endurance and stamina. She has experienced some falls because of her difficulty walking. I recommended a course of physical therapy and will need therapy sessions in  a quiet environment because of her easy distractibility. I will see Lori Miles back in follow up in 6 months or sooner if needed. Mom agreed with these plans.   The medication list was reviewed and reconciled.  No changes were made in the prescribed medications today.  A complete medication list was provided to the patient's mother.   Allergies as of 09/04/2017      Reactions   Abilify [aripiprazole] Swelling, Palpitations   Seroquel [quetiapine Fumarate] Palpitations   Doxycycline Other (See Comments)   Stomach cramps   Amoxicillin-pot Clavulanate Hives   Keppra [levetiracetam] Other (See Comments)   Aggressive behavior       Medication List        Accurate as of 09/04/17  8:45 PM. Always use your most recent med list.  acetaminophen 325 MG tablet Commonly known as:  TYLENOL Take 2 tablets (650 mg total) by mouth every 6 (six) hours as needed for mild pain or moderate pain.   albuterol (2.5 MG/3ML) 0.083% nebulizer solution Commonly known as:  PROVENTIL INHALE CONTENTS OF 1 VIAL IN NEBULIZER EVERY 4 TO 6 HOURS IF NEEDED FOR COUGH OR WHEEZING   benzonatate 200 MG capsule Commonly known as:  TESSALON Take 1 capsule (200 mg total) by mouth 3 (three) times daily as needed.   budesonide 1 MG/2ML nebulizer solution Commonly known as:  PULMICORT Take 2 mLs (1 mg total) by nebulization 2 (two) times daily.   budesonide 0.5 MG/2ML nebulizer solution Commonly known as:  PULMICORT Use one vial in the nebulizer twice daily as directed   cephALEXin 500 MG capsule Commonly known as:  KEFLEX Take 1 capsule (500 mg total) by mouth 2 (two) times daily.   cetirizine 10 MG tablet Commonly known as:  ZYRTEC TAKE 1 TABLET BY MOUTH DAILY F OR RUNNY NOSE OR ITCHING   cloNIDine 0.1 MG tablet Commonly known as:  CATAPRES TAKE 1 AND 1/2 TABLET BY MOUTH AT BEDTIME   DEPAKOTE 500 MG DR tablet Generic drug:  divalproex Give 1 tablet in the morning and 2 tablets at night    fluconazole 100 MG tablet Commonly known as:  DIFLUCAN TAKE 1 TABLET BY MOUTH DAILY   hyoscyamine 0.125 MG SL tablet Commonly known as:  LEVSIN SL Place 0.125 mg under the tongue every 4 (four) hours as needed for cramping.   Melatonin 3 MG Tabs Take 3 mg by mouth at bedtime. For insomnia   montelukast 10 MG tablet Commonly known as:  SINGULAIR TAKE 1 TABLET BY MOUTH EVERY EVENING TO PREVENT COUGH OR WHEEZE   ondansetron 4 MG disintegrating tablet Commonly known as:  ZOFRAN-ODT DISSOLVE 1 TABLET(4 MG) ON THE TONGUE EVERY 8 HOURS AS NEEDED FOR NAUSEA OR VOMITING   PERFOROMIST 20 MCG/2ML nebulizer solution Generic drug:  formoterol PLACE 2 MLS BY NEBULIZATION 2 TIMES DAILY   polyethylene glycol packet Commonly known as:  MIRALAX / GLYCOLAX Take 17 g by mouth 2 (two) times daily.   promethazine 25 MG suppository Commonly known as:  PHENERGAN Place 1 suppository (25 mg total) rectally every 6 (six) hours as needed for nausea or vomiting.   ranitidine 150 MG capsule Commonly known as:  ZANTAC Take 1 capsule (150 mg total) 2 (two) times daily by mouth.   SEASONIQUE 0.15-0.03 &0.01 MG tablet Generic drug:  Levonorgestrel-Ethinyl Estradiol Take 1 tablet by mouth daily.   senna 8.6 MG tablet Commonly known as:  SENOKOT Take 1 tablet by mouth at bedtime.   traZODone 50 MG tablet Commonly known as:  DESYREL Take 2 tablets (100 mg total) by mouth at bedtime.       [name] was consulted regarding the patient.   Total time spent with the patient was 30 minutes, of which 50% or more was spent in counseling and coordination of care.   Elveria Rising NP-C

## 2017-09-04 NOTE — Telephone Encounter (Signed)
Patients mom is calling to get a refill on zyrtec and montelukast to [facility] [address]

## 2017-09-04 NOTE — Telephone Encounter (Signed)
Pt mother has worked it out to see another provider at [facility] that she feels she will like better, pt will not have to travel long distance

## 2017-09-04 NOTE — Telephone Encounter (Signed)
Copied from CRM [phone]. Topic: Quick Communication - See Telephone Encounter >> Sep 01, 2017  8:56 AM Louie BunPalacios Medina, [name] wrote: CRM for notification. See Telephone encounter for: 09/01/17. Mom called and would like to talk to Dr. Okey Duprerawford or her CMA about pt possible UTI. She wants for Dr. Okey Duprerawford to put in a order for her to take a order for urine sample. Please call mom back, thanks. >> Sep 04, 2017  9:33 AM Floria RavelingStovall, Shana A wrote: Pt mother called in and stated that pt is stilling throwing up and using the restroom on her self.  Would like to know if something could be called in for her today?

## 2017-09-04 NOTE — Telephone Encounter (Signed)
Can you please call patient's mother for me. Culture did not show any growth.

## 2017-09-04 NOTE — Patient Instructions (Signed)
Thank you for coming in today.   Instructions for you until your next appointment are as follows: 1. I will refer Lori Miles to [facility] in [place]. Their number is [phone] 2. Continue all her medications as you have been giving them. 3. Lori Miles should be drinking at least 48 oz of water each day. Try to avoid giving her soft drinks and sweet tea. 4. Please plan to return for follow up in 6 months or sooner if needed.

## 2017-09-04 NOTE — Telephone Encounter (Signed)
Prescription have been sent in with 0 refills. This is a courtesy refill.

## 2017-09-08 ENCOUNTER — Encounter: Payer: Self-pay | Admitting: Internal Medicine

## 2017-09-08 ENCOUNTER — Other Ambulatory Visit (INDEPENDENT_AMBULATORY_CARE_PROVIDER_SITE_OTHER): Payer: Medicare Other

## 2017-09-08 ENCOUNTER — Ambulatory Visit (INDEPENDENT_AMBULATORY_CARE_PROVIDER_SITE_OTHER): Payer: Medicare Other | Admitting: Internal Medicine

## 2017-09-08 VITALS — BP 100/60 | HR 118 | Temp 98.0°F | Ht <= 58 in | Wt 189.0 lb

## 2017-09-08 DIAGNOSIS — R531 Weakness: Secondary | ICD-10-CM | POA: Diagnosis not present

## 2017-09-08 DIAGNOSIS — N390 Urinary tract infection, site not specified: Secondary | ICD-10-CM | POA: Diagnosis not present

## 2017-09-08 DIAGNOSIS — J301 Allergic rhinitis due to pollen: Secondary | ICD-10-CM | POA: Diagnosis not present

## 2017-09-08 LAB — URINALYSIS, ROUTINE W REFLEX MICROSCOPIC
Hgb urine dipstick: NEGATIVE
Ketones, ur: 15 — AB
Leukocytes, UA: NEGATIVE
Nitrite: NEGATIVE
PH: 5.5 (ref 5.0–8.0)
Urine Glucose: NEGATIVE
Urobilinogen, UA: 2 — AB (ref 0.0–1.0)

## 2017-09-08 NOTE — Patient Instructions (Signed)
We are checking the urine today and will call you back.  The vomiting could be coming from allergy drainage inside the stomach.

## 2017-09-08 NOTE — Progress Notes (Signed)
   Subjective:    Patient ID: Daneen SchickJasmine Crance, female    DOB: June 04, 1995, 22 y.o.   MRN: [id number]  HPI The patient is a 22 YO female coming in for follow up with her mother (legal guardian). She is walking well today and no nausea during visit. She is not coughing during visit. Her mother is still concerned about ongoing uti symptoms including urinary incontinence. She is worried about uti as she has had several in the last few months. She is not pulling at her groin any longer. She is still having some episodes of vomiting without preceding nausea. She does have allergic rhinitis and this is bad currently. She is not using her medications all the time. She usually throws up clear fluid or sometimes yellow material. No food contents usually. She has stopped giving her daughter sprite and she only drinks water now. She is repeatedly asking for sprite during the visit.   Review of Systems  Unable to perform ROS: Patient nonverbal      Objective:   Physical Exam  Constitutional: She appears well-developed and well-nourished.  HENT:  Head: Normocephalic and atraumatic.  Clear drainage in the oropharynx, TMs normal bilaterally  Eyes: EOM are normal.  Neck: Normal range of motion.  Cardiovascular: Normal rate and regular rhythm.  Pulmonary/Chest: Effort normal and breath sounds normal. No respiratory distress. She has no wheezes. She has no rales.  Abdominal: Soft. Bowel sounds are normal. She exhibits no distension. There is no tenderness. There is no rebound.  Musculoskeletal: She exhibits no edema.  Neurological: She is alert. Coordination normal.  Does not follow commands always, sometimes can do this  Skin: Skin is warm and dry.   Vitals:   09/08/17 1007  BP: 100/60  Pulse: (!) 118  Temp: 98 F (36.7 C)  TempSrc: Oral  SpO2: 97%  Weight: 189 lb (85.7 kg)  Height: 4\' 9"  (1.448 m)      Assessment & Plan:

## 2017-09-09 NOTE — Assessment & Plan Note (Signed)
Mom is now not giving her sprite which is a positive step. We talked about how her weight may be causing some health concerns and worsening others including reflux and breathing.

## 2017-09-09 NOTE — Assessment & Plan Note (Signed)
She does have a lot of drainage on exam today and has not been back to her allergy provider. Mom does continue to provider albuterol treatments for perceived wheezing. Last visit her mom perceived wheezing which was actually upper airway noise. We tried to educate about this and how her daughter was not wheezing. She likely needs better control of allergy symptoms rather than breathing symptoms as her breathing was normal on exam today.

## 2017-09-09 NOTE — Assessment & Plan Note (Signed)
She is walking normally today and seems very steady. Encouraged ongoing activity and exercise to help with her overall strength and wellbeing.

## 2017-09-09 NOTE — Assessment & Plan Note (Signed)
She has had several recent urinary cultures without infection however her mother is insistent that she has infection. Sample collected today and will order U/A and culture. She is concerned about vomiting as symptom of infection which her neurologist has unfortunately reinforced. We talked about how there could be other causes for the vomiting including her allergic rhinitis causing drainage going to the stomach which could cause regurgitation. She currently does not have nausea although mom is giving her zofran quite often. She usually has vomiting unprovoked and without nausea afterwards and can eat or drink even afterwards. Some of this could be behavioral as well. It is unclear and mom is a poor historian which makes management and determination of etiology difficult.

## 2017-09-10 ENCOUNTER — Telehealth (INDEPENDENT_AMBULATORY_CARE_PROVIDER_SITE_OTHER): Payer: Self-pay | Admitting: Family

## 2017-09-10 LAB — URINE CULTURE
MICRO NUMBER: 90751435
SPECIMEN QUALITY:: ADEQUATE

## 2017-09-10 NOTE — Telephone Encounter (Signed)
At Rozalyn's recent visit, I referred Lori Miles to [facility]. She called today to tell me that [facility] called her and said that they could not see [place] in [place] but could see [place] in [place]. Mom does not have transportation to [place]. I told Mom that I would see if I can locate another physical therapy choice for her in [place] and will call her tomorrow. Mom agreed with this plan. TG

## 2017-09-11 ENCOUNTER — Telehealth: Payer: Self-pay | Admitting: *Deleted

## 2017-09-11 NOTE — Telephone Encounter (Signed)
Please call the mother Hulen Shoutsquanetta in regards to a medication refill she said she was told by you to call her when prescriptions gets low..Marland Kitchen

## 2017-09-11 NOTE — Telephone Encounter (Signed)
Pt mother called to office for refills on Diflucan. States that Lori Miles is taking Diflucan daily and needs new Rx with refills sent in to pharmacy.    Please review and send refills.

## 2017-09-12 NOTE — Telephone Encounter (Signed)
Why does she need to take Diflucan daily?

## 2017-09-19 ENCOUNTER — Telehealth: Payer: Self-pay

## 2017-09-19 ENCOUNTER — Telehealth (INDEPENDENT_AMBULATORY_CARE_PROVIDER_SITE_OTHER): Payer: Self-pay | Admitting: Family

## 2017-09-19 DIAGNOSIS — G4700 Insomnia, unspecified: Secondary | ICD-10-CM

## 2017-09-19 MED ORDER — CLONIDINE HCL 0.1 MG PO TABS: ORAL_TABLET | ORAL | 1 refills | Status: DC

## 2017-09-19 MED ORDER — TRAZODONE HCL 50 MG PO TABS: 100.0000 mg | ORAL_TABLET | Freq: Every day | ORAL | 1 refills | Status: DC

## 2017-09-19 NOTE — Telephone Encounter (Signed)
Please review above message

## 2017-09-19 NOTE — Telephone Encounter (Signed)
Mother called requesting rx for diflucan. Mom states that she takes this daily. Pt is taking this daily due to being ATB daily. Pt wears adult briefs, and is always has vaginal irritation.

## 2017-09-19 NOTE — Telephone Encounter (Signed)
Please have Lori Miles address this.

## 2017-09-19 NOTE — Telephone Encounter (Signed)
Rx has been electronically sent to the pharmacy 

## 2017-09-23 ENCOUNTER — Telehealth: Payer: Self-pay | Admitting: *Deleted

## 2017-09-23 NOTE — Telephone Encounter (Signed)
Patient needs to be seen.  Has not been in the office for a while.

## 2017-09-23 NOTE — Telephone Encounter (Signed)
Ms. Raford PitcherBarrett called in.  She is frustrated and is trying to get refills of Diflucan for her daughter.  I relayed the previously documented messages to her that since her daughter has not been seen in our office since 2017, her provider would not approve this refill.  I stressed the importance of her daughter making an appointment.  Mom states her daughter is too sick right now and has an upcoming surgery and will not be able to schedule to come in for a while.    I staff messaged her PCP to see if this was some thing she could manage, since she was seen by her PCP two weeks ago.

## 2017-09-30 ENCOUNTER — Ambulatory Visit: Payer: Medicare Other | Admitting: Allergy and Immunology

## 2017-09-30 ENCOUNTER — Encounter: Payer: Self-pay | Admitting: Allergy and Immunology

## 2017-09-30 ENCOUNTER — Ambulatory Visit (INDEPENDENT_AMBULATORY_CARE_PROVIDER_SITE_OTHER): Payer: Medicare Other | Admitting: Allergy and Immunology

## 2017-09-30 VITALS — BP 110/72 | HR 100 | Resp 18

## 2017-09-30 DIAGNOSIS — J3089 Other allergic rhinitis: Secondary | ICD-10-CM | POA: Diagnosis not present

## 2017-09-30 DIAGNOSIS — J4540 Moderate persistent asthma, uncomplicated: Secondary | ICD-10-CM | POA: Diagnosis not present

## 2017-09-30 DIAGNOSIS — K219 Gastro-esophageal reflux disease without esophagitis: Secondary | ICD-10-CM

## 2017-09-30 MED ORDER — MOMETASONE FUROATE 50 MCG/ACT NA SUSP: 2.0000 | Freq: Every day | NASAL | 5 refills | Status: DC

## 2017-09-30 MED ORDER — RANITIDINE HCL 300 MG PO TABS: 300.0000 mg | ORAL_TABLET | Freq: Every day | ORAL | 5 refills | Status: DC

## 2017-09-30 NOTE — Progress Notes (Signed)
Follow-up Note  Referring Provider: Fleet Contras, MD Primary Provider: Fleet Contras, MD Date of Office Visit: 09/30/2017  Subjective:   Lori Miles (DOB: 1995-11-17) is a 22 y.o. female who [facility] in re-evaluation of the following:  HPI: Lori Miles returns to this clinic in reevaluation of her asthma and allergic rhinoconjunctivitis and reflux induced respiratory disease.  Her last visit to this clinic was 12 February 2017.  She has had an excellent interval of time regarding her asthma and has not required a systemic steroid or antibiotic to treat any type of respiratory tract issue while consistently using nebulized bronchodilator and steroid as well as consistent use of a leukotriene modifier.  Rarely does she use a short acting bronchodilator.  She will not use a nasal steroid.  Her reflux has been under excellent control.  She has no complaints relating to her throat.  She has been having significant issues with recurrent urinary tract infections during the interval.  She apparently had no immunity directed against tetanus based on previous evaluation of anti-tetanus antibody levels and apparently she did receive the tetanus booster during the interval.  Allergies as of 09/30/2017      Reactions   Abilify [aripiprazole] Swelling, Palpitations   Seroquel [quetiapine Fumarate] Palpitations   Doxycycline Other (See Comments)   Stomach cramps   Amoxicillin-pot Clavulanate Hives   Keppra [levetiracetam] Other (See Comments)   Aggressive behavior       Medication List      acetaminophen 325 MG tablet Commonly known as:  TYLENOL Take 2 tablets (650 mg total) by mouth every 6 (six) hours as needed for mild pain or moderate pain.   albuterol (2.5 MG/3ML) 0.083% nebulizer solution Commonly known as:  PROVENTIL INHALE CONTENTS OF 1 VIAL IN NEBULIZER EVERY 4 TO 6 HOURS IF NEEDED FOR COUGH OR WHEEZING   benzonatate 200 MG  capsule Commonly known as:  TESSALON Take 1 capsule (200 mg total) by mouth 3 (three) times daily as needed.   budesonide 1 MG/2ML nebulizer solution Commonly known as:  PULMICORT Take 2 mLs (1 mg total) by nebulization 2 (two) times daily.   budesonide 0.5 MG/2ML nebulizer solution Commonly known as:  PULMICORT Use one vial in the nebulizer twice daily as directed   cetirizine 10 MG tablet Commonly known as:  ZYRTEC TAKE 1 TABLET BY MOUTH DAILY F OR RUNNY NOSE OR ITCHING   cloNIDine 0.1 MG tablet Commonly known as:  CATAPRES TAKE 1 AND 1/2 TABLET BY MOUTH AT BEDTIME   DEPAKOTE 500 MG DR tablet Generic drug:  divalproex Give 1 tablet in the morning and 2 tablets at night   fluconazole 100 MG tablet Commonly known as:  DIFLUCAN TAKE 1 TABLET BY MOUTH DAILY   hyoscyamine 0.125 MG SL tablet Commonly known as:  LEVSIN SL Place 0.125 mg under the tongue every 4 (four) hours as needed for cramping.   Melatonin 3 MG Tabs Take 3 mg by mouth at bedtime. For insomnia   montelukast 10 MG tablet Commonly known as:  SINGULAIR TAKE 1 TABLET BY MOUTH EVERY EVENING TO PREVENT COUGH OR WHEEZE   ondansetron 4 MG disintegrating tablet Commonly known as:  ZOFRAN-ODT DISSOLVE 1 TABLET(4 MG) ON THE TONGUE EVERY 8 HOURS AS NEEDED FOR NAUSEA OR VOMITING   PERFOROMIST 20 MCG/2ML nebulizer solution Generic drug:  formoterol PLACE 2 MLS BY NEBULIZATION 2 TIMES DAILY   polyethylene glycol packet Commonly known as:  MIRALAX /  GLYCOLAX Take 17 g by mouth 2 (two) times daily.   promethazine 25 MG suppository Commonly known as:  PHENERGAN Place 1 suppository (25 mg total) rectally every 6 (six) hours as needed for nausea or vomiting.   ranitidine 150 MG capsule Commonly known as:  ZANTAC Take 1 capsule (150 mg total) 2 (two) times daily by mouth.   SEASONIQUE 0.15-0.03 &0.01 MG tablet Generic drug:  Levonorgestrel-Ethinyl Estradiol Take 1 tablet by mouth daily.   senna 8.6 MG  tablet Commonly known as:  SENOKOT Take 1 tablet by mouth at bedtime.   tolterodine 2 MG 24 hr capsule Commonly known as:  DETROL LA TK 1 C PO QD   traZODone 50 MG tablet Commonly known as:  DESYREL Take 2 tablets (100 mg total) by mouth at bedtime.       Past Medical History:  Diagnosis Date  . Asthma   . Autism    mom states no actual dx, but admits she has some symptoms  . Bacteriuria   . Chronic constipation   . Cognitive developmental delay   . Disruptive behavior disorder   . Family history of adverse reaction to anesthesia    mother had itching after anesthesia one time  . Frequency-urgency syndrome   . Gait disorder    ORGANIC PER NERUOLGIST NOTE (DR HICKLING)  . 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 GERD (gastroesophageal reflux disease)   . Gout   . H/O sinus tachycardia   . History of acute respiratory failure 02/14/2015   SECONDARY TO CAP  . History of recurrent UTIs   . Interstitial cystitis   . Moderate intellectual disability   . OSA (obstructive sleep apnea)    no machine yet   . Partial epilepsy with impairment of consciousness (HCC)    FOLLOWED BY DR HICKLING  . Pneumonia 2018 last time  . PONV (postoperative nausea and vomiting)   . Seizure (HCC)    most recent sz " at the end of summer"-last sz years ago per mother  . Urinary incontinence     Past Surgical History:  Procedure Laterality Date  . CYSTOSCOPY N/A 06/03/2017   Procedure: CYSTOSCOPY WITH EXAM UNDER ANESTHESIA;  Surgeon: Jerilee FieldEskridge, [name], MD;  [facility];  Service: Urology;  Laterality: N/A;  . CYSTOSCOPY WITH HYDRODISTENSION AND BIOPSY  04/14/2012   Procedure: CYSTOSCOPY/BIOPSY/HYDRODISTENSION;  Surgeon: Lindaann SloughMarc-Henry Nesi, MD;  Location: [facility];  Service: Urology;;  instillation of marcaine and pyridium  . EUA/  PAP SMEAR/  BREAST EXAM  03-12-2016   [facility]  . EUA/ VAGINOSCOPY/ COLPOSCOPY FO THE  HYMENAL RING  10-04-2009    [facility]  . TONSILLECTOMY      Review of systems negative except as noted in HPI / PMHx or noted below:  Review of Systems  Constitutional: Negative.   HENT: Negative.   Eyes: Negative.   Respiratory: Negative.   Cardiovascular: Negative.   Gastrointestinal: Negative.   Genitourinary: Negative.   Musculoskeletal: Negative.   Skin: Negative.   Neurological: Negative.   Endo/Heme/Allergies: Negative.   Psychiatric/Behavioral: Negative.      Objective:   Vitals:   09/30/17 0952  BP: 110/72  Pulse: 100  Resp: 18  SpO2: 98%          Physical Exam  HENT:  Head: Normocephalic.  Right Ear: Tympanic membrane, external ear and ear canal normal.  Left Ear: Tympanic membrane, external ear and ear canal normal.  Nose: Nose normal. No mucosal edema or rhinorrhea.  Mouth/Throat: Uvula is midline, oropharynx is clear and moist and mucous membranes are normal. No oropharyngeal exudate.  Eyes: Conjunctivae are normal.  Neck: Trachea normal. No tracheal tenderness present. No tracheal deviation present. No thyromegaly present.  Cardiovascular: Normal rate, regular rhythm, S1 normal, S2 normal and normal heart sounds.  No murmur heard. Pulmonary/Chest: Breath sounds normal. No stridor. No respiratory distress. She has no wheezes. She has no rales.  Musculoskeletal: She exhibits no edema.  Lymphadenopathy:       Head (right side): No tonsillar adenopathy present.       Head (left side): No tonsillar adenopathy present.    She has no cervical adenopathy.  Neurological: She is alert.  Skin: No rash noted. She is not diaphoretic. No erythema. Nails show no clubbing.    Diagnostics:   The patient had an Asthma Control Test with the following results: ACT Total Score: 21.    Assessment and Plan:   1. Asthma, moderate persistent, well-controlled   2. Other allergic rhinitis   3. Gastroesophageal reflux disease, esophagitis presence not  specified     1. Continue a combination of the following:   A. Decrease budesonide 1 mg nebulized ONCE a day  B. Decrease Perforomist nebulized ONCE a day  C. Attempt to use Nasonex or Flonase one spray a few times per week  D. montelukast 10 mg daily  E. cetirizine 10 mg daily  2. Continue to Treat reflux with a combination of the following:   A. ranitidine 300 mg twice a day  B. No caffeine or chocolate consumption  3. Continue albuterol nebulization every 4-6 hours if needed  4. "Action plan" for asthma flare up:   A. increase budesonide to 4 times a day  B. continue Perforomist twice a day  C. use albuterol if needed  5. Obtain fall flu vaccine  6. Return in November 2019 or earlier if problem  Kjerstin has really done quite well during the interval in time in which we have seen her in this clinic in December 2018 and I think there may be an opportunity to consolidate some of her medical treatment as noted above by decreasing her budesonide and Perforomist to just morning use.  She will also continue to treat reflux as noted above.  I will see her back in this clinic in November 2019 or earlier if there is a problem.  Laurette Schimke, MD Allergy / Immunology [facility]

## 2017-09-30 NOTE — Patient Instructions (Signed)
  1. Continue a combination of the following:   A. Decrease budesonide 1 mg nebulized ONCE a day  B. Decrease Perforomist nebulized ONCE a day  C. Attempt to use Nasonex or Flonase one spray a few times per week  D. montelukast 10 mg daily  E. cetirizine 10 mg daily  2. Continue to Treat reflux with a combination of the following:   A. ranitidine 300 mg twice a day  B. No caffeine or chocolate consumption  3. Continue albuterol nebulization every 4-6 hours if needed  4. "Action plan" for asthma flare up:   A. increase budesonide to 4 times a day  B. continue Perforomist twice a day  C. use albuterol if needed  5. Obtain fall flu vaccine  6. Return in November 2019 or earlier if problem

## 2017-10-01 ENCOUNTER — Encounter: Payer: Self-pay | Admitting: Allergy and Immunology

## 2017-10-01 MED ORDER — RANITIDINE HCL 300 MG PO TABS: 300.0000 mg | ORAL_TABLET | Freq: Two times a day (BID) | ORAL | 5 refills | Status: DC

## 2017-10-01 NOTE — Addendum Note (Signed)
Addended by: Mliss FritzBLACK, Naveyah Iacovelli I on: 10/01/2017 08:20 AM   Modules accepted: Orders

## 2017-10-02 ENCOUNTER — Encounter (HOSPITAL_COMMUNITY): Payer: Self-pay

## 2017-10-02 ENCOUNTER — Emergency Department (HOSPITAL_COMMUNITY): Payer: Medicare Other

## 2017-10-02 ENCOUNTER — Emergency Department (HOSPITAL_COMMUNITY)
Admission: EM | Admit: 2017-10-02 | Discharge: 2017-10-02 | Disposition: A | Discharge status: Home / Self Care | Payer: Medicare Other | Attending: Emergency Medicine | Admitting: Emergency Medicine

## 2017-10-02 ENCOUNTER — Other Ambulatory Visit: Payer: Self-pay

## 2017-10-02 ENCOUNTER — Telehealth: Payer: Self-pay

## 2017-10-02 DIAGNOSIS — Z79899 Other long term (current) drug therapy: Secondary | ICD-10-CM | POA: Diagnosis not present

## 2017-10-02 DIAGNOSIS — J45909 Unspecified asthma, uncomplicated: Secondary | ICD-10-CM | POA: Diagnosis not present

## 2017-10-02 DIAGNOSIS — N390 Urinary tract infection, site not specified: Secondary | ICD-10-CM | POA: Insufficient documentation

## 2017-10-02 DIAGNOSIS — R1110 Vomiting, unspecified: Secondary | ICD-10-CM | POA: Insufficient documentation

## 2017-10-02 DIAGNOSIS — R509 Fever, unspecified: Secondary | ICD-10-CM | POA: Diagnosis present

## 2017-10-02 LAB — CBC
HCT: 37.1 % (ref 36.0–46.0)
HEMOGLOBIN: 12.1 g/dL (ref 12.0–15.0)
MCH: 30.4 pg (ref 26.0–34.0)
MCHC: 32.6 g/dL (ref 30.0–36.0)
MCV: 93.2 fL (ref 78.0–100.0)
PLATELETS: 267 10*3/uL (ref 150–400)
RBC: 3.98 MIL/uL (ref 3.87–5.11)
RDW: 14.6 % (ref 11.5–15.5)
WBC: 10.8 10*3/uL — AB (ref 4.0–10.5)

## 2017-10-02 LAB — URINALYSIS, ROUTINE W REFLEX MICROSCOPIC
Bilirubin Urine: NEGATIVE
Glucose, UA: NEGATIVE mg/dL
Ketones, ur: NEGATIVE mg/dL
LEUKOCYTES UA: NEGATIVE
NITRITE: NEGATIVE
PROTEIN: NEGATIVE mg/dL
pH: 6 (ref 5.0–8.0)

## 2017-10-02 LAB — COMPREHENSIVE METABOLIC PANEL
ALK PHOS: 52 U/L (ref 38–126)
ALT: 24 U/L (ref 0–44)
ANION GAP: 9 (ref 5–15)
AST: 21 U/L (ref 15–41)
Albumin: 2.9 g/dL — ABNORMAL LOW (ref 3.5–5.0)
BUN: 11 mg/dL (ref 6–20)
CALCIUM: 8.8 mg/dL — AB (ref 8.9–10.3)
CO2: 25 mmol/L (ref 22–32)
CREATININE: 0.78 mg/dL (ref 0.44–1.00)
Chloride: 108 mmol/L (ref 98–111)
GFR calc Af Amer: >60 mL/min (ref >60)
Glucose, Bld: 96 mg/dL (ref 70–99)
Potassium: 4.3 mmol/L (ref 3.5–5.1)
SODIUM: 142 mmol/L (ref 135–145)
Total Bilirubin: 0.4 mg/dL (ref 0.3–1.2)
Total Protein: 6.6 g/dL (ref 6.5–8.1)

## 2017-10-02 LAB — URINALYSIS, MICROSCOPIC (REFLEX)

## 2017-10-02 LAB — I-STAT BETA HCG BLOOD, ED (MC, WL, AP ONLY)

## 2017-10-02 LAB — LIPASE, BLOOD: LIPASE: 31 U/L (ref 11–51)

## 2017-10-02 MED ORDER — CEPHALEXIN 500 MG PO CAPS: 500.0000 mg | ORAL_CAPSULE | Freq: Two times a day (BID) | ORAL | 0 refills | Status: AC

## 2017-10-02 NOTE — Telephone Encounter (Signed)
Spoke with mother and she will call back to make appt

## 2017-10-02 NOTE — ED Triage Notes (Signed)
Pt presents to ED from home for UTI, fever, and emesis for about a week. [name] reports that pt started abx for UTI about a week ago, but developed fever yesterday. [name] reports that pt has had increased wheezing since she started the medicine.

## 2017-10-02 NOTE — Discharge Instructions (Addendum)
Please take the antibiotic, Keflex, as prescribed for possible urinary tract infection.  We have sent your daughter's urine off to be cultured, you will be contacted regarding the results of this test. Please follow-up with your primary care provider as soon as possible regarding your visit today. Please return to the emergency department for any new or worsening symptoms.  Contact a doctor if: You have back pain. You have a fever. You feel sick to your stomach (nauseous). You throw up (vomit). Your symptoms do not get better after 3 days. Your symptoms go away and then come back. Get help right away if: You have very bad back pain. You have very bad lower belly (abdominal) pain. You are throwing up and cannot keep down any medicines or water.

## 2017-10-02 NOTE — ED Provider Notes (Signed)
[facility]-EMERGENCY DEPT Provider Note   CSN: [id number] Arrival date & time: 10/02/17  1191     History   Chief Complaint Chief Complaint  Patient presents with  . Recurrent UTI  . Emesis  . Fever    HPI history gathered from patient's mother, patient is nonverbal.  Lori Miles is a 22 y.o. female presenting with her mother for concerns of fever and urinary tract infection as well as emesis for approximately 1 week.  Patient's mother states that she has been on multiple antibiotics for UTIs over the past few months but keeps developing these infections.  Patient's mother states for the past week the patient has also had an increase in vomiting, however patient's mother states that vomiting is normal for the patient.    Patient's mother also reports the patient has been wheezing occasionally.  Per 09/30/2017 office visit at Dr.Kozlow office patient with history of asthma, moderate persistent, well-controlled with budesonide, Perforomist and albuterol as needed.    Patient's mother also reports the patient has felt warm however has not taken the patient's temperature.  Patient's mother denies that the patient has experienced hematuria, hematemesis, hemoptysis, cough.  HPI  Past Medical History:  Diagnosis Date  . Asthma   . Autism    mom states no actual dx, but admits she has some symptoms  . Bacteriuria   . Chronic constipation   . Cognitive developmental delay   . Disruptive behavior disorder   . Family history of adverse reaction to anesthesia    mother had itching after anesthesia one time  . Frequency-urgency syndrome   . Gait disorder    ORGANIC PER NERUOLGIST NOTE (DR HICKLING)  . 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 GERD (gastroesophageal reflux disease)   . Gout   . H/O sinus tachycardia   . History of acute respiratory failure 02/14/2015   SECONDARY TO CAP  . History of recurrent UTIs   . Interstitial  cystitis   . Moderate intellectual disability   . OSA (obstructive sleep apnea)    no machine yet   . Partial epilepsy with impairment of consciousness (HCC)    FOLLOWED BY DR HICKLING  . Pneumonia 2018 last time  . PONV (postoperative nausea and vomiting)   . Seizure (HCC)    most recent sz " at the end of summer"-last sz years ago per mother  . Urinary incontinence     Patient Active Problem List   Diagnosis Date Noted  . Allergic rhinitis 04/03/2017  . Recurrent falls 03/21/2017  . Weakness 03/21/2017  . Venous insufficiency 03/21/2017  . Obstructive sleep apnea 09/30/2016  . Excessive daytime sleepiness 03/05/2016  . 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 Cough variant asthma vs UACS/ vcd  03/15/2015  . Autism spectrum disorder 02/14/2015  . Obesity, morbid (HCC) 12/13/2014  . 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 Acanthosis nigricans 11/14/2014  . Toeing-in 08/29/2014  . 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 Recurrent urinary tract infection 08/01/2011  . Asthma 08/01/2011  . Chronic constipation 08/01/2011  . Contraception 08/01/2011    Past Surgical History:  Procedure Laterality Date  . CYSTOSCOPY N/A 06/03/2017   Procedure: CYSTOSCOPY WITH EXAM UNDER ANESTHESIA;  Surgeon: Jerilee Field, MD;  Location: [facility];  Service: Urology;  Laterality: N/A;  . CYSTOSCOPY WITH HYDRODISTENSION AND BIOPSY  04/14/2012   Procedure: CYSTOSCOPY/BIOPSY/HYDRODISTENSION;  Surgeon: Lindaann Slough, MD;  Location: [facility];  Service: Urology;;  instillation of marcaine and pyridium  . EUA/  PAP SMEAR/  BREAST EXAM  03-12-2016   dr Erin Fulling [facility]  . EUA/ VAGINOSCOPY/ COLPOSCOPY FO THE HYMENAL RING  10-04-2009    dr  Tamela Oddi  [facility]  . TONSILLECTOMY       OB History   None      Home Medications    Prior to Admission medications   Medication Sig Start Date End Date Taking? Authorizing Provider  acetaminophen (TYLENOL) 325 MG tablet Take 2 tablets (650 mg total) by mouth every 6 (six) hours as needed for mild pain or moderate pain. 02/27/17  Yes Everlene Farrier, PA-C  albuterol (PROVENTIL) (2.5 MG/3ML) 0.083% nebulizer solution INHALE CONTENTS OF 1 VIAL IN NEBULIZER EVERY 4 TO 6 HOURS IF NEEDED FOR COUGH OR WHEEZING 07/07/17  Yes Kozlow, Alvira Philips, MD  benzonatate (TESSALON) 200 MG capsule Take 1 capsule (200 mg total) by mouth 3 (three) times daily as needed. Patient taking differently: Take 200 mg by mouth 3 (three) times daily as needed for cough.  07/11/17  Yes Myrlene Broker, MD  budesonide (PULMICORT) 0.5 MG/2ML nebulizer solution Use one vial in the nebulizer twice daily as directed Patient taking differently: Take 1 mg by nebulization. Use one vial in the nebulizer twice daily as directed 09/01/17  Yes Kozlow, Alvira Philips, MD  cetirizine (ZYRTEC) 10 MG tablet TAKE 1 TABLET BY MOUTH DAILY F OR RUNNY NOSE OR ITCHING 09/04/17  Yes Kozlow, Alvira Philips, MD  cloNIDine (CATAPRES) 0.1 MG tablet TAKE 1 AND 1/2 TABLET BY MOUTH AT BEDTIME 09/19/17  Yes Elveria Rising, NP  DEPAKOTE 500 MG DR tablet Give 1 tablet in the morning and 2 tablets at night 09/04/17  Yes Goodpasture, Inetta Fermo, NP  fluconazole (DIFLUCAN) 100 MG tablet TAKE 1 TABLET BY MOUTH DAILY 08/21/17  Yes Brock Bad, MD  Levonorgestrel-Ethinyl Estradiol (SEASONIQUE) 0.15-0.03 &0.01 MG tablet Take 1 tablet by mouth daily.   Yes [provider]  Melatonin 3 MG TABS Take 3 mg by mouth at bedtime. For insomnia   Yes [provider]  montelukast (SINGULAIR) 10 MG tablet TAKE 1 TABLET BY MOUTH EVERY EVENING TO PREVENT COUGH OR WHEEZE 09/04/17  Yes Kozlow, Alvira Philips, MD  ondansetron (ZOFRAN-ODT) 4 MG disintegrating tablet DISSOLVE 1 TABLET(4 MG)  ON THE TONGUE EVERY 8 HOURS AS NEEDED FOR NAUSEA OR VOMITING 07/28/17  Yes Myrlene Broker, MD  PERFOROMIST 20 MCG/2ML nebulizer solution PLACE 2 MLS BY NEBULIZATION 2 TIMES DAILY 09/01/17  Yes Kozlow, Alvira Philips, MD  polyethylene glycol (MIRALAX / GLYCOLAX) packet Take 17 g by mouth 2 (two) times daily.   Yes [provider]  ranitidine (ZANTAC) 300 MG tablet Take 1 tablet (300 mg total) by mouth 2 (two) times daily. 10/01/17  Yes Kozlow, Alvira Philips, MD  senna (SENOKOT) 8.6 MG tablet Take 1 tablet by mouth at bedtime.    Yes [provider]  traZODone (DESYREL) 50 MG tablet Take 2 tablets (100 mg total) by mouth at bedtime. 09/19/17  Yes Goodpasture, Inetta Fermo, NP  budesonide (PULMICORT) 1 MG/2ML nebulizer solution Take 2 mLs (1 mg total) by nebulization 2 (two) times daily. Patient not taking: Reported on 10/02/2017 03/05/17   Jessica Priest, MD  cephALEXin (KEFLEX) 500 MG capsule Take 1 capsule (500 mg total) by mouth  2 (two) times daily for 7 days. 10/02/17 10/09/17  Harlene Salts A, PA-C  mometasone (NASONEX) 50 MCG/ACT nasal spray Place 2 sprays into the nose daily. Patient not taking: Reported on 10/02/2017 09/30/17   Jessica Priest, MD  promethazine (PHENERGAN) 25 MG suppository Place 1 suppository (25 mg total) rectally every 6 (six) hours as needed for nausea or vomiting. Patient not taking: Reported on 10/02/2017 07/10/17   Derwood Kaplan, MD    Family History Family History  Problem Relation Age of Onset  . Seizures Mother   . Seizures Sister        1 sister has  . Pancreatic cancer Sister   . Colon cancer Neg Hx   . Colon polyps Neg Hx   . Esophageal cancer Neg Hx   . Rectal cancer Neg Hx   . Stomach cancer Neg Hx     Social History Social History   Tobacco Use  . Smoking status: Never Smoker  . Smokeless tobacco: Never Used  Substance Use Topics  . Alcohol use: No    Alcohol/week: 0.0 oz  . Drug use: No     Allergies   Abilify [aripiprazole]; Seroquel  [quetiapine fumarate]; Doxycycline; Amoxicillin-pot clavulanate; and Keppra [levetiracetam]   Review of Systems Review of Systems  Unable to perform ROS: Patient is nonverbal/autism.   Physical Exam Updated Vital Signs BP 118/78 (BP Location: Left Arm)   Pulse 91   Temp 97.8 F (36.6 C) (Oral)   Resp 16   Ht 4\' 9"  (1.448 m)   Wt 83.9 kg (185 lb)   SpO2 100%   BMI 40.03 kg/m   Physical Exam  Constitutional: She appears well-developed and well-nourished. No distress.  HENT:  Head: Normocephalic and atraumatic.  Right Ear: External ear normal.  Left Ear: External ear normal.  Nose: Nose normal.  Mouth/Throat: Oropharynx is clear and moist.  Eyes: Pupils are equal, round, and reactive to light. Conjunctivae and EOM are normal.  Neck: Normal range of motion. Neck supple. No JVD present. No tracheal deviation present.  Cardiovascular: Normal rate, regular rhythm, normal heart sounds and intact distal pulses.  Pulmonary/Chest: Effort normal and breath sounds normal. No accessory muscle usage. No respiratory distress. She has no wheezes.  There are no wheezing, rhonchi or rales on auscultation of the lungs bilaterally.  Abdominal: Soft. Bowel sounds are normal. There is no tenderness. There is no rebound and no guarding.  Musculoskeletal: Normal range of motion. She exhibits no tenderness or deformity.  Neurological: She is alert.  Skin: Skin is warm and dry. Capillary refill takes less than 2 seconds. She is not diaphoretic.     ED Treatments / Results  Labs (all labs ordered are listed, but only abnormal results are displayed) Labs Reviewed  COMPREHENSIVE METABOLIC PANEL - Abnormal; Notable for the following components:      Result Value   Calcium 8.8 (*)    Albumin 2.9 (*)    All other components within normal limits  CBC - Abnormal; Notable for the following components:   WBC 10.8 (*)    All other components within normal limits  URINALYSIS, ROUTINE W REFLEX  MICROSCOPIC - Abnormal; Notable for the following components:   Specific Gravity, Urine >1.030 (*)    Hgb urine dipstick TRACE (*)    All other components within normal limits  URINALYSIS, MICROSCOPIC (REFLEX) - Abnormal; Notable for the following components:   Bacteria, UA FEW (*)    All other components within normal limits  URINE CULTURE  LIPASE, BLOOD  I-STAT BETA HCG BLOOD, ED ([facility], [facility], [facility] ONLY)    EKG None  Radiology Dg Chest 2 View  Result Date: 10/02/2017 CLINICAL DATA:  UTI.  Fever. EXAM: CHEST - 2 VIEW COMPARISON:  07/28/2017. FINDINGS: Mediastinum and hilar structures normal. Low lung volumes with mild bibasilar atelectasis. No pleural effusion or pneumothorax. No acute bony abnormality. IMPRESSION: Low lung volumes with mild bibasilar atelectasis. Electronically Signed   By: Maisie Fushomas  Register   On: 10/02/2017 07:32   Dg Abd 2 Views  Result Date: 10/02/2017 CLINICAL DATA:  Vomiting.  Urinary frequency. EXAM: ABDOMEN - 2 VIEW COMPARISON:  05/21/2017 FINDINGS: There is no evidence of intraperitoneal free air. A moderate amount of stool is present throughout the colon. No dilated loops of bowel are seen to suggest obstruction. The osseous structures are unremarkable. The visualized lung bases are clear. IMPRESSION: Moderate colonic stool burden.  No evidence of bowel obstruction. Electronically Signed   By: Sebastian AcheAllen  Grady M.D.   On: 10/02/2017 07:58    Procedures Procedures (including critical care time)  Medications Ordered in ED Medications - No data to display   Initial Impression / Assessment and Plan / ED Course  I have reviewed the triage vital signs and the nursing notes.  Pertinent labs & imaging results that were available during my care of the patient were reviewed by me and considered in my medical decision making (see chart for details).  Urinalysis unremarkable, will send for urine culture.  Will treat empirically with Keflex pending urine culture. On  reevaluation patient sitting comfortably in room, no acute distress, eating [employer].  No vomiting, patient smiling and happy. Abdominal imaging shows constipation, no evidence of obstruction.  Patient with history of chronic constipation.  Patient is afebrile, nontoxic-appearing, sitting on examination table comfortably, eating food without vomiting or obvious signs of pain.  Patient does not meet SIRS or sepsis criteria.  Patient does not have a surgical abdomen, no peritoneal signs, no rebound. Labs reviewed, mildly elevated white count, trace bacteria in urine, may be sign of contamination, I have sent urine off for culture and I am treating prophylactically with Keflex.  Vital signs stable. Patient's mother states that she will call the primary care doctor today to schedule a follow-up appointment.   Patient has also been seen and evaluated by Dr. Manus Gunningancour who agrees with discharge, empiric treatment for UTI and close follow-up with primary care provider.  At this time there does not appear to be any evidence of an acute emergency medical condition and the patient appears stable for discharge with appropriate outpatient follow up. Diagnosis was discussed with patient's mother who verbalizes understanding and is agreeable to discharge. I have discussed return precautions with patient's parents who verbalize understanding of return precautions. Patient strongly encouraged to follow-up with their PCP.  Patient's case discussed with Dr. Manus Gunningancour who agrees with plan to discharge with follow-up.     This note was dictated using DragonOne dictation software; please contact for any inconsistencies within the note.   Final Clinical Impressions(s) / ED Diagnoses   Final diagnoses:  Vomiting  Lower urinary tract infectious disease    ED Discharge Orders        Ordered    cephALEXin (KEFLEX) 500 MG capsule  2 times daily     10/02/17 0857       Bill SalinasMorelli, Eleftherios Dudenhoeffer A, PA-C 10/02/17 1434      Glynn Octaveancour, Stephen, MD 10/02/17 2135

## 2017-10-02 NOTE — Telephone Encounter (Signed)
Copied from CRM [phone]. Topic: Quick Communication - See Telephone Encounter >> Oct 02, 2017 10:31 AM Mare LoanBurton, Donna F wrote: Pt  mom is wanting to let dr Okey Duprerawford know that pt was back in the ER yesterday with UTI and vomiting and she would like a call back to discuss things  Best number [phone]

## 2017-10-02 NOTE — ED Notes (Signed)
Attempted to collect urine. Pt was able to collect a small sample. Will try to send off. Sample was not enough to send for culture. [name] aware to collect a second urine sample. [name] refuses the in and out cath at this time.

## 2017-10-02 NOTE — Telephone Encounter (Signed)
Patient will need an ER follow up to discuss anything. Can you please make appointment. Thank you

## 2017-10-02 NOTE — ED Notes (Signed)
Pt tolerating PO fluids with no difficulties

## 2017-10-03 LAB — URINE CULTURE

## 2017-10-04 ENCOUNTER — Encounter (HOSPITAL_COMMUNITY): Payer: Self-pay | Admitting: *Deleted

## 2017-10-04 ENCOUNTER — Emergency Department (HOSPITAL_COMMUNITY)
Admission: EM | Admit: 2017-10-04 | Discharge: 2017-10-05 | Disposition: A | Discharge status: Home / Self Care | Payer: Medicare Other | Attending: Emergency Medicine | Admitting: Emergency Medicine

## 2017-10-04 ENCOUNTER — Other Ambulatory Visit: Payer: Self-pay

## 2017-10-04 DIAGNOSIS — R112 Nausea with vomiting, unspecified: Secondary | ICD-10-CM | POA: Diagnosis present

## 2017-10-04 DIAGNOSIS — G43A0 Cyclical vomiting, not intractable: Secondary | ICD-10-CM | POA: Diagnosis not present

## 2017-10-04 DIAGNOSIS — R1115 Cyclical vomiting syndrome unrelated to migraine: Secondary | ICD-10-CM

## 2017-10-04 NOTE — ED Triage Notes (Signed)
Pt mother states pt is being treated for UTI, but does not feel like it is working, She has had fevers and vomiting.

## 2017-10-05 DIAGNOSIS — G43A0 Cyclical vomiting, not intractable: Secondary | ICD-10-CM | POA: Diagnosis not present

## 2017-10-05 LAB — CBC WITH DIFFERENTIAL/PLATELET
BASOS ABS: 0 10*3/uL (ref 0.0–0.1)
Basophils Relative: 0 %
Eosinophils Absolute: 0 10*3/uL (ref 0.0–0.7)
Eosinophils Relative: 0 %
HCT: 33.5 % — ABNORMAL LOW (ref 36.0–46.0)
Hemoglobin: 11 g/dL — ABNORMAL LOW (ref 12.0–15.0)
Lymphocytes Relative: 43 %
Lymphs Abs: 5 10*3/uL — ABNORMAL HIGH (ref 0.7–4.0)
MCH: 30.4 pg (ref 26.0–34.0)
MCHC: 32.8 g/dL (ref 30.0–36.0)
MCV: 92.5 fL (ref 78.0–100.0)
MONOS PCT: 10 %
Monocytes Absolute: 1.2 10*3/uL — ABNORMAL HIGH (ref 0.1–1.0)
NEUTROS ABS: 5.3 10*3/uL (ref 1.7–7.7)
NEUTROS PCT: 47 %
Platelets: 274 10*3/uL (ref 150–400)
RBC: 3.62 MIL/uL — ABNORMAL LOW (ref 3.87–5.11)
RDW: 14.4 % (ref 11.5–15.5)
WBC: 11.5 10*3/uL — ABNORMAL HIGH (ref 4.0–10.5)

## 2017-10-05 LAB — URINALYSIS, ROUTINE W REFLEX MICROSCOPIC
Bilirubin Urine: NEGATIVE
Glucose, UA: 50 mg/dL — AB
Hgb urine dipstick: NEGATIVE
Ketones, ur: NEGATIVE mg/dL
Leukocytes, UA: NEGATIVE
Nitrite: NEGATIVE
Protein, ur: NEGATIVE mg/dL
Specific Gravity, Urine: 1.021 (ref 1.005–1.030)
pH: 6 (ref 5.0–8.0)

## 2017-10-05 LAB — VALPROIC ACID LEVEL: Valproic Acid Lvl: 50 ug/mL (ref 50.0–100.0)

## 2017-10-05 LAB — BASIC METABOLIC PANEL
ANION GAP: 7 (ref 5–15)
BUN: 13 mg/dL (ref 6–20)
CALCIUM: 8.7 mg/dL — AB (ref 8.9–10.3)
CO2: 26 mmol/L (ref 22–32)
Chloride: 106 mmol/L (ref 98–111)
Creatinine, Ser: 0.72 mg/dL (ref 0.44–1.00)
Glucose, Bld: 142 mg/dL — ABNORMAL HIGH (ref 70–99)
POTASSIUM: 3.7 mmol/L (ref 3.5–5.1)
Sodium: 139 mmol/L (ref 135–145)

## 2017-10-05 LAB — POC URINE PREG, ED: Preg Test, Ur: NEGATIVE

## 2017-10-05 MED ORDER — SULFAMETHOXAZOLE-TRIMETHOPRIM 800-160 MG PO TABS: 1.0000 | ORAL_TABLET | Freq: Two times a day (BID) | ORAL | 0 refills | Status: AC

## 2017-10-05 MED ORDER — SODIUM CHLORIDE 0.9 % IV BOLUS (SEPSIS)
1000.0000 mL | Freq: Once | INTRAVENOUS | Status: AC
  Administered 2017-10-05: 1000 mL via INTRAVENOUS

## 2017-10-05 MED ORDER — ONDANSETRON HCL 4 MG/2ML IJ SOLN
4.0000 mg | Freq: Once | INTRAMUSCULAR | Status: AC
  Administered 2017-10-05: 4 mg via INTRAVENOUS
  Filled 2017-10-05: qty 2

## 2017-10-05 NOTE — ED Provider Notes (Signed)
[facility]-EMERGENCY DEPT Provider Note   CSN: [id number] Arrival date & time: 10/04/17  2334     History   Chief Complaint Chief Complaint  Patient presents with  . Emesis   Level 5 caveat due to patient is nonverbal HPI Lori Miles is a 22 y.o. female.  The history is provided by a parent.  Emesis   This is a recurrent problem. Episode onset: Several hours ago. The problem has been gradually worsening. The emesis has an appearance of stomach contents. Maximum temperature: "Felt warm" Associated symptoms include abdominal pain. Pertinent negatives include no diarrhea.   Patient with history of cognitive delay, nonverbal, seizures presents with recurrent vomiting and suspicion for UTI. Mother reports patient was diagnosed with a UTI about 2 days ago and placed on Keflex.  Since that time she has had recurrent vomiting.  Mother reports she felt warm but no recorded fever.  No change in bowel habits.  She does report like she has abdominal pain.  This is similar to prior UTIs.  She frequently will require multiple antibiotics There is no concern for foreign body ingestion  Patient is otherwise at her baseline.  No recent seizures.  Past Medical History:  Diagnosis Date  . Asthma   . Autism    mom states no actual dx, but admits she has some symptoms  . Bacteriuria   . Chronic constipation   . Cognitive developmental delay   . Disruptive behavior disorder   . Family history of adverse reaction to anesthesia    mother had itching after anesthesia one time  . Frequency-urgency syndrome   . Gait disorder    ORGANIC PER NERUOLGIST NOTE (DR HICKLING)  . 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 GERD (gastroesophageal reflux disease)   . Gout   . H/O sinus tachycardia   . History of acute respiratory failure 02/14/2015   SECONDARY TO CAP  . History of recurrent UTIs   . Interstitial cystitis   . Moderate intellectual  disability   . OSA (obstructive sleep apnea)    no machine yet   . Partial epilepsy with impairment of consciousness (HCC)    FOLLOWED BY DR HICKLING  . Pneumonia 2018 last time  . PONV (postoperative nausea and vomiting)   . Seizure (HCC)    most recent sz " at the end of summer"-last sz years ago per mother  . Urinary incontinence     Patient Active Problem List   Diagnosis Date Noted  . Allergic rhinitis 04/03/2017  . Recurrent falls 03/21/2017  . Weakness 03/21/2017  . Venous insufficiency 03/21/2017  . Obstructive sleep apnea 09/30/2016  . Excessive daytime sleepiness 03/05/2016  . 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 Cough variant asthma vs UACS/ vcd  03/15/2015  . Autism spectrum disorder 02/14/2015  . Obesity, morbid (HCC) 12/13/2014  . 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 Acanthosis nigricans 11/14/2014  . Toeing-in 08/29/2014  . 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 Recurrent urinary tract infection 08/01/2011  . Asthma 08/01/2011  . Chronic constipation 08/01/2011  . Contraception 08/01/2011    Past Surgical History:  Procedure Laterality Date  . CYSTOSCOPY N/A 06/03/2017   Procedure: CYSTOSCOPY WITH EXAM UNDER ANESTHESIA;  Surgeon: Jerilee FieldEskridge, [name], MD;  [facility];  Service:  Urology;  Laterality: N/A;  . CYSTOSCOPY WITH HYDRODISTENSION AND BIOPSY  04/14/2012   Procedure: CYSTOSCOPY/BIOPSY/HYDRODISTENSION;  Surgeon: Lindaann Slough, MD;  Location: [facility];  Service: Urology;;  instillation of marcaine and pyridium  . EUA/  PAP SMEAR/  BREAST EXAM  03-12-2016   dr Erin Fulling [facility]  . EUA/ VAGINOSCOPY/ COLPOSCOPY FO THE HYMENAL RING  10-04-2009    dr Tamela Oddi  [facility]  . TONSILLECTOMY        OB History   None      Home Medications    Prior to Admission medications   Medication Sig Start Date End Date Taking? Authorizing Provider  acetaminophen (TYLENOL) 325 MG tablet Take 2 tablets (650 mg total) by mouth every 6 (six) hours as needed for mild pain or moderate pain. 02/27/17   Everlene Farrier, PA-C  albuterol (PROVENTIL) (2.5 MG/3ML) 0.083% nebulizer solution INHALE CONTENTS OF 1 VIAL IN NEBULIZER EVERY 4 TO 6 HOURS IF NEEDED FOR COUGH OR WHEEZING 07/07/17   Kozlow, Alvira Philips, MD  benzonatate (TESSALON) 200 MG capsule Take 1 capsule (200 mg total) by mouth 3 (three) times daily as needed. Patient taking differently: Take 200 mg by mouth 3 (three) times daily as needed for cough.  07/11/17   Myrlene Broker, MD  budesonide (PULMICORT) 0.5 MG/2ML nebulizer solution Use one vial in the nebulizer twice daily as directed Patient taking differently: Take 1 mg by nebulization. Use one vial in the nebulizer twice daily as directed 09/01/17   Kozlow, Alvira Philips, MD  budesonide (PULMICORT) 1 MG/2ML nebulizer solution Take 2 mLs (1 mg total) by nebulization 2 (two) times daily. Patient not taking: Reported on 10/02/2017 03/05/17   Jessica Priest, MD  cephALEXin (KEFLEX) 500 MG capsule Take 1 capsule (500 mg total) by mouth 2 (two) times daily for 7 days. 10/02/17 10/09/17  Harlene Salts A, PA-C  cetirizine (ZYRTEC) 10 MG tablet TAKE 1 TABLET BY MOUTH DAILY F OR RUNNY NOSE OR ITCHING 09/04/17   Kozlow, Alvira Philips, MD  cloNIDine (CATAPRES) 0.1 MG tablet TAKE 1 AND 1/2 TABLET BY MOUTH AT BEDTIME 09/19/17   Elveria Rising, NP  DEPAKOTE 500 MG DR tablet Give 1 tablet in the morning and 2 tablets at night 09/04/17   Elveria Rising, NP  fluconazole (DIFLUCAN) 100 MG tablet TAKE 1 TABLET BY MOUTH DAILY 08/21/17   Brock Bad, MD  Levonorgestrel-Ethinyl Estradiol (SEASONIQUE) 0.15-0.03 &0.01 MG tablet Take 1 tablet by mouth daily.    [provider]  Melatonin 3 MG TABS Take 3 mg by  mouth at bedtime. For insomnia    [provider]  mometasone (NASONEX) 50 MCG/ACT nasal spray Place 2 sprays into the nose daily. Patient not taking: Reported on 10/02/2017 09/30/17   Jessica Priest, MD  montelukast (SINGULAIR) 10 MG tablet TAKE 1 TABLET BY MOUTH EVERY EVENING TO PREVENT COUGH OR WHEEZE 09/04/17   Kozlow, Alvira Philips, MD  ondansetron (ZOFRAN-ODT) 4 MG disintegrating tablet DISSOLVE 1 TABLET(4 MG) ON THE TONGUE EVERY 8 HOURS AS NEEDED FOR NAUSEA OR VOMITING 07/28/17   Myrlene Broker, MD  PERFOROMIST 20 MCG/2ML nebulizer solution PLACE 2 MLS BY NEBULIZATION 2 TIMES DAILY 09/01/17   Kozlow, Alvira Philips, MD  polyethylene glycol (MIRALAX / GLYCOLAX) packet Take 17 g by mouth 2 (two) times daily.    [provider]  promethazine (PHENERGAN) 25 MG suppository Place 1 suppository (25 mg total) rectally every 6 (six) hours as needed for nausea or vomiting. Patient  not taking: Reported on 10/02/2017 07/10/17   Derwood Kaplan, MD  ranitidine (ZANTAC) 300 MG tablet Take 1 tablet (300 mg total) by mouth 2 (two) times daily. 10/01/17   Kozlow, Alvira Philips, MD  senna (SENOKOT) 8.6 MG tablet Take 1 tablet by mouth at bedtime.     [provider]  traZODone (DESYREL) 50 MG tablet Take 2 tablets (100 mg total) by mouth at bedtime. 09/19/17   Elveria Rising, NP    Family History Family History  Problem Relation Age of Onset  . Seizures Mother   . Seizures Sister        1 sister has  . Pancreatic cancer Sister   . Colon cancer Neg Hx   . Colon polyps Neg Hx   . Esophageal cancer Neg Hx   . Rectal cancer Neg Hx   . Stomach cancer Neg Hx     Social History Social History   Tobacco Use  . Smoking status: Never Smoker  . Smokeless tobacco: Never Used  Substance Use Topics  . Alcohol use: No    Alcohol/week: 0.0 oz  . Drug use: No     Allergies   Abilify [aripiprazole]; Seroquel [quetiapine fumarate]; Doxycycline; Amoxicillin-pot clavulanate; and Keppra  [levetiracetam]   Review of Systems Review of Systems  Unable to perform ROS: Patient nonverbal  Gastrointestinal: Positive for abdominal pain and vomiting. Negative for diarrhea.     Physical Exam Updated Vital Signs BP 113/76   Pulse 88   Temp 98.3 F (36.8 C) (Oral)   Resp 16   SpO2 97%   Physical Exam CONSTITUTIONAL: Anxious but in no distress HEAD: Normocephalic/atraumatic EYES: EOMI/PERRL, no icterus ENMT: Mucous membranes dry NECK: supple no meningeal signs SPINE/BACK:entire spine nontender CV: S1/S2 noted, no murmurs/rubs/gallops noted LUNGS: Lungs are clear to auscultation bilaterally, no apparent distress ABDOMEN: soft, nontender, no rebound or guarding, bowel sounds noted throughout abdomen GU:no cva tenderness NEURO: Pt is awake/alert, moves all extremitiesx4.  She has minimal verbal interaction.  She follows commands and is very cooperative. EXTREMITIES: pulses normal/equal, full ROM SKIN: warm, color normal PSYCH: Mildly anxious  ED Treatments / Results  Labs (all labs ordered are listed, but only abnormal results are displayed) Labs Reviewed  BASIC METABOLIC PANEL - Abnormal; Notable for the following components:      Result Value   Glucose, Bld 142 (*)    Calcium 8.7 (*)    All other components within normal limits  CBC WITH DIFFERENTIAL/PLATELET - Abnormal; Notable for the following components:   WBC 11.5 (*)    RBC 3.62 (*)    Hemoglobin 11.0 (*)    HCT 33.5 (*)    Lymphs Abs 5.0 (*)    Monocytes Absolute 1.2 (*)    All other components within normal limits  URINALYSIS, ROUTINE W REFLEX MICROSCOPIC - Abnormal; Notable for the following components:   Glucose, UA 50 (*)    All other components within normal limits  URINE CULTURE  VALPROIC ACID LEVEL  POC URINE PREG, ED    EKG None  Radiology No results found.  Procedures Procedures    Medications Ordered in ED Medications  sodium chloride 0.9 % bolus 1,000 mL (0 mLs Intravenous  Stopped 10/05/17 0233)  ondansetron (ZOFRAN) injection 4 mg (4 mg Intravenous Given 10/05/17 0038)     Initial Impression / Assessment and Plan / ED Course  I have reviewed the triage vital signs and the nursing notes.  Pertinent labs results that were available during my  care of the patient were reviewed by me and considered in my medical decision making (see chart for details).     3:01 AM Patient here with recurrent vomiting, mother felt was likely due to UTI. Here in the ER, patient is improved.  She is taking p.o. fluids.  Labs are reassuring. Vitals are appropriate.  She is not septic appearing. We discussed further options including CT imaging.  Mother would like to decline CT imaging since she is improved. Mother would like to place her on Bactrim as she feels strongly that she still has residual UTI.  I did inform her of normal urinalysis, but she insists on a new antibiotic.  She will hold the Keflex, and start Bactrim for 7 days. Urine cultures are pending. Overall patient appears at baseline.  Final Clinical Impressions(s) / ED Diagnoses   Final diagnoses:  Non-intractable cyclical vomiting with nausea    ED Discharge Orders        Ordered    sulfamethoxazole-trimethoprim (BACTRIM DS,SEPTRA DS) 800-160 MG tablet  2 times daily     10/05/17 0300       Zadie Rhine, MD 10/05/17 0302

## 2017-10-06 LAB — URINE CULTURE: Culture: <10000 — AB

## 2017-10-07 ENCOUNTER — Other Ambulatory Visit: Payer: Self-pay | Admitting: Allergy and Immunology

## 2017-10-07 ENCOUNTER — Telehealth: Payer: Self-pay

## 2017-10-07 NOTE — Telephone Encounter (Signed)
Please inform mom that she can use 150 mg two times a day or 300 mg one time per day.

## 2017-10-07 NOTE — Telephone Encounter (Signed)
pts mom called in and was wondering why you had changed pt from the 150 to 300 mg of ranitidine? In your notes you stated she was doing well on the current regiment. Mom stated her GI dr would be wanting to know why she was changed.

## 2017-10-08 ENCOUNTER — Telehealth: Payer: Self-pay | Admitting: Internal Medicine

## 2017-10-08 NOTE — Telephone Encounter (Signed)
Pts mother states pt was written a prescription for zantac 300 by her asthma doctor but she told him that Dr. Rhea BeltonPyrtle prescribes zantac for her and she told the asthma doctor that he made a mistake. Also wanted to report that pt has been diagnosed with sleep apnea and that is why she vomits all the time. States her throat closes at night and makes her vomit. States they are working on getting pt a sleep apnea machine. Also states pts PCP said it was fine for Dr. Rhea BeltonPyrtle to prescribe her "vomit" medicine, it did not matter if it was the PCP or Dr. Rhea BeltonPyrtle. Let pts mother know that this would be noted in the chart.

## 2017-10-08 NOTE — Telephone Encounter (Signed)
Informed mom of what dr Lucie Leatherkozlow stated

## 2017-10-10 ENCOUNTER — Other Ambulatory Visit: Payer: Self-pay | Admitting: Urology

## 2017-10-10 DIAGNOSIS — R350 Frequency of micturition: Secondary | ICD-10-CM

## 2017-10-10 DIAGNOSIS — Q6101 Congenital single renal cyst: Secondary | ICD-10-CM

## 2017-10-14 ENCOUNTER — Other Ambulatory Visit: Payer: Self-pay | Admitting: Allergy and Immunology

## 2017-10-14 MED ORDER — BUDESONIDE 1 MG/2ML IN SUSP: 1.0000 mg | Freq: Two times a day (BID) | RESPIRATORY_TRACT | 5 refills | Status: DC

## 2017-10-16 ENCOUNTER — Ambulatory Visit (HOSPITAL_COMMUNITY)
Admission: RE | Admit: 2017-10-16 | Discharge: 2017-10-16 | Disposition: A | Discharge status: Home / Self Care | Payer: Medicare Other | Source: Ambulatory Visit | Attending: Urology | Admitting: Urology

## 2017-10-16 DIAGNOSIS — N281 Cyst of kidney, acquired: Secondary | ICD-10-CM | POA: Diagnosis not present

## 2017-10-16 DIAGNOSIS — R350 Frequency of micturition: Secondary | ICD-10-CM | POA: Insufficient documentation

## 2017-10-16 DIAGNOSIS — Q6101 Congenital single renal cyst: Secondary | ICD-10-CM

## 2017-10-22 ENCOUNTER — Ambulatory Visit (INDEPENDENT_AMBULATORY_CARE_PROVIDER_SITE_OTHER): Payer: Medicare Other | Admitting: Internal Medicine

## 2017-10-22 ENCOUNTER — Encounter: Payer: Self-pay | Admitting: Internal Medicine

## 2017-10-22 ENCOUNTER — Other Ambulatory Visit: Payer: Medicare Other

## 2017-10-22 DIAGNOSIS — N390 Urinary tract infection, site not specified: Secondary | ICD-10-CM

## 2017-10-22 DIAGNOSIS — F819 Developmental disorder of scholastic skills, unspecified: Secondary | ICD-10-CM | POA: Diagnosis not present

## 2017-10-22 LAB — POCT URINALYSIS DIPSTICK
BILIRUBIN UA: NEGATIVE
Glucose, UA: NEGATIVE
KETONES UA: NEGATIVE
Leukocytes, UA: NEGATIVE
Nitrite, UA: NEGATIVE
PH UA: 6 (ref 5.0–8.0)
Protein, UA: NEGATIVE
RBC UA: NEGATIVE
Spec Grav, UA: >=1.03 — AB (ref 1.010–1.025)
UROBILINOGEN UA: 0.2 U/dL

## 2017-10-22 MED ORDER — ONDANSETRON 4 MG PO TBDP: ORAL_TABLET | ORAL | 1 refills | Status: DC

## 2017-10-22 NOTE — Assessment & Plan Note (Addendum)
It is unclear to me if these do represent urinary infections. It is possible that her seizure disorder is affecting her bladder function. I also think that some behavioral delay or attention seeking could be related to the recurrent gagging and then vomiting clear fluid. Both urine cultures recently have been negative but she has gotten 2 antibiotics. I worry that so many antibiotics are causing her health problems and that her mother is seeking ER visits where she is given her antibiotic even though it is not indicated. She is asked to return to urology from outside facility to see if they can come up with a plan.

## 2017-10-22 NOTE — Addendum Note (Signed)
Addended by: Berton LanGULCH, BRIANA R on: 10/22/2017 08:50 AM   Modules accepted: Orders

## 2017-10-22 NOTE — Assessment & Plan Note (Signed)
I worry that her recent vomiting and gagging herself could represent a behavioral issue rather than a health issue. I have breached this topic with the mother and she states that this could not be. Will continue to counsel.

## 2017-10-22 NOTE — Progress Notes (Signed)
   Subjective:    Patient ID: Lori Miles, female    DOB: [birth year], 22 y.o.   MRN: [id number]  HPI The patient is a 22 YO female coming in with mother (guardian) for ER follow up. She has been taken to the ER twice in the last 2 weeks by mom. She has thought that the patient has a UTI and insists on antibiotics from the ER. These were provided and she has taken keflex and bactrim in the last 2 weeks (despite unconvincing U/A and ultimately negative urine culture). Her mom today is still thinking that she has urinary infection and is asking for another antibiotic. They have not seen urology recently and were not happy with urology in town due to them not agreeing with her about these "infections". They were seeing urology at outside facility and they thought that she has some kind of rare condition (mom cannot remember name) that the infection is hiding in muscle. The mom is asking for another CT today (prior in March normal, recent US bladder normal). She does have a history of interstitial cystitis. Patient is sleeping throughout visit and lying down. Urology has prescribed daily trimethoprim but they have not started this yet.   Review of Systems  Unable to perform ROS: Patient nonverbal      Objective:   Physical Exam  Constitutional: She appears well-developed and well-nourished. No distress.  Sleeping during visit, able to sit up independently. Can talk but does not always answer questions appropriately.   HENT:  Head: Normocephalic and atraumatic.  Right Ear: External ear normal.  Left Ear: External ear normal.  Eyes: Pupils are equal, round, and reactive to light. EOM are normal.  Neck: Neck supple.  Cardiovascular: Regular rhythm.  fast  Pulmonary/Chest: Effort normal and breath sounds normal.  Abdominal: Soft. Bowel sounds are normal.  Neurological: No cranial nerve deficit. Coordination normal.  Skin: Skin is warm and dry.   Vitals:   10/22/17 0811  BP: 90/60  Pulse: (!)  113  Temp: 98.6 F (37 C)  TempSrc: Oral  SpO2: 97%  Weight: 187 lb (84.8 kg)  Height: 4\' 9"  (1.448 m)      Assessment & Plan:  Visit time 25 minutes: greater than 50% of that time was spent in face to face counseling and coordination of care with the patient: counseled about recent negative urine cultures, possibility of behavioral change causing the vomiting, talked with her about the importance of physical activity.

## 2017-10-22 NOTE — Patient Instructions (Signed)
We are checking the urine and have sent in zofran.   I would not recommend phenergan with her seizures.

## 2017-10-23 LAB — URINE CULTURE
MICRO NUMBER:: 90934450
SPECIMEN QUALITY:: ADEQUATE

## 2017-10-28 ENCOUNTER — Telehealth: Payer: Self-pay

## 2017-10-28 ENCOUNTER — Telehealth: Payer: Self-pay | Admitting: Pulmonary Disease

## 2017-10-28 DIAGNOSIS — G4733 Obstructive sleep apnea (adult) (pediatric): Secondary | ICD-10-CM

## 2017-10-28 NOTE — Telephone Encounter (Signed)
Patients mother informed that there is no bacteria found in urine culture and stated understanding

## 2017-10-28 NOTE — Telephone Encounter (Signed)
[phone] [name]-mother to patient is calling back.

## 2017-10-28 NOTE — Telephone Encounter (Signed)
Attempted to call patient's mother to return call. I did not receive an answer at time of call. I have left a voicemail message for pt to return call. X1

## 2017-10-28 NOTE — Telephone Encounter (Signed)
atc mother [name], no answer, vm full.  Wcb.

## 2017-10-28 NOTE — Telephone Encounter (Signed)
Copied from CRM [phone]. Topic: General - Other >> Oct 27, 2017  4:54 PM Marylen PontoMcneil, Ja-Kwan wrote: Reason for CRM: Pt mother Hulen Shoutsquanetta requests lab results. Cb# [phone]

## 2017-10-30 ENCOUNTER — Other Ambulatory Visit: Payer: Self-pay | Admitting: Allergy and Immunology

## 2017-10-30 NOTE — Telephone Encounter (Signed)
Spoke with the pt's mother, [name]  She is asking if we can order another sleep study to be done at the sleep center or if there are any alternatives  Pt tried HST x 2 and could not get any usable information  Please advise thanks

## 2017-10-30 NOTE — Telephone Encounter (Signed)
Pt Mother is calling back [phone]

## 2017-10-30 NOTE — Telephone Encounter (Signed)
Attempted to contact pt's mother. I did not receive an answer. There was no option for me to leave a message. Will try back.

## 2017-10-31 NOTE — Telephone Encounter (Signed)
Order for Spilt night study placed.  Patient's Mother notified about upcoming test and that someone will contact her about scheduling, after insurance approval. Patient's mother stated understanding.  Nothing further at this time.

## 2017-10-31 NOTE — Telephone Encounter (Signed)
Please send order for in lab splint night sleep study.  Please let her mother know that this will require prior authorization from insurance before this can get scheduled.

## 2017-11-11 ENCOUNTER — Other Ambulatory Visit (INDEPENDENT_AMBULATORY_CARE_PROVIDER_SITE_OTHER): Payer: Self-pay | Admitting: Family

## 2017-11-11 ENCOUNTER — Telehealth: Payer: Self-pay | Admitting: Allergy and Immunology

## 2017-11-11 ENCOUNTER — Telehealth (INDEPENDENT_AMBULATORY_CARE_PROVIDER_SITE_OTHER): Payer: Self-pay | Admitting: Family

## 2017-11-11 ENCOUNTER — Other Ambulatory Visit: Payer: Self-pay | Admitting: Internal Medicine

## 2017-11-11 DIAGNOSIS — G4700 Insomnia, unspecified: Secondary | ICD-10-CM

## 2017-11-11 MED ORDER — TRAZODONE HCL 50 MG PO TABS: 100.0000 mg | ORAL_TABLET | Freq: Every day | ORAL | 5 refills | Status: DC

## 2017-11-11 MED ORDER — CLONIDINE HCL 0.1 MG PO TABS: ORAL_TABLET | ORAL | 5 refills | Status: DC

## 2017-11-11 MED ORDER — CLONIDINE HCL 0.1 MG PO TABS
ORAL_TABLET | ORAL | 5 refills | Status: DC
Start: 2017-11-11 — End: 2017-11-11

## 2017-11-11 NOTE — Telephone Encounter (Signed)
Rx has been electronically sent to the pharmacy 

## 2017-11-11 NOTE — Telephone Encounter (Signed)
I called and spoke with mom and informed her of the treatment plan.

## 2017-11-11 NOTE — Telephone Encounter (Signed)
Please increase neb bud / form to twice a day

## 2017-11-11 NOTE — Telephone Encounter (Signed)
Pt mom called and wanted to go back giving the neb treatments 2 times a day instead of 1 time aday. [phone].

## 2017-11-11 NOTE — Telephone Encounter (Signed)
I called and spoke with the mother and she feels that decreasing her budesonide and performorist nebulizer treatments to once a day are not sufficient for Lori Miles. She states that she still has wheezing and shortness of breath and her mother is now having to give her an albuterol nebulizer treatment every morning along with her other nebulizer treatments. She wants to go back to nebulizer treatments for twice a day and wanted for you to advise.

## 2017-11-12 ENCOUNTER — Telehealth: Payer: Self-pay | Admitting: Internal Medicine

## 2017-11-12 ENCOUNTER — Other Ambulatory Visit: Payer: Self-pay | Admitting: *Deleted

## 2017-11-12 MED ORDER — BUDESONIDE 1 MG/2ML IN SUSP: 1.0000 mg | Freq: Two times a day (BID) | RESPIRATORY_TRACT | 1 refills | Status: DC

## 2017-11-12 MED ORDER — RANITIDINE HCL 150 MG PO TABS: 150.0000 mg | ORAL_TABLET | Freq: Two times a day (BID) | ORAL | 4 refills | Status: DC

## 2017-11-12 NOTE — Telephone Encounter (Signed)
Noted. Will also cc Dr Lucie LeatherKozlow.

## 2017-11-12 NOTE — Telephone Encounter (Signed)
Dr Rhea BeltonPyrtle, please advise... (see 10/08/17 telephone note as well). Dr Lucie LeatherKozlow has been sending zantac 300 mg script for patient but mother continues to ask for 150 mg script. Do you want us to send 150 mg or keep her on the 300 mg script Dr Lucie LeatherKozlow has been prescribing for her? Looks like Dr Lucie LeatherKozlow sent 300 mg, we switched back to 150 mg and then it was switched back again to 300 mg?

## 2017-11-12 NOTE — Telephone Encounter (Signed)
150 mg twice daily should be sufficient if symptoms are controlled

## 2017-11-12 NOTE — Telephone Encounter (Signed)
Patient mother calling stating she needs Dr.Pyrtle to refill pt [facility]ac medication for [facility]. Patients mother states her pcp accidentally sent in a prescription for her but it was the wrong dose and she would like Dr.Pyrtle to fix it. See phone note from 7.24.19 in regards to similar note.

## 2017-11-26 ENCOUNTER — Telehealth (INDEPENDENT_AMBULATORY_CARE_PROVIDER_SITE_OTHER): Payer: Self-pay | Admitting: Family

## 2017-11-26 NOTE — Telephone Encounter (Signed)
I was contacted by Lori Miles's mother for help with hospital bed. She said that she was being billed for use of hospital bed for Lori Miles but that she hasn't been using it since she sold her house and looking for new house. Lori Miles and Lori Miles have been living with Lori Miles's sister and Lori hospital bed could not be moved there. Lori Miles asked me to call [facility], attention Lori Miles at ext [phone] about Lori billing for Lori hospital bed. I called and left a message for Lori Miles and asked her to call me back. Lori Miles

## 2017-11-27 ENCOUNTER — Ambulatory Visit (INDEPENDENT_AMBULATORY_CARE_PROVIDER_SITE_OTHER): Payer: Medicare Other | Admitting: Internal Medicine

## 2017-11-27 ENCOUNTER — Encounter: Payer: Self-pay | Admitting: Internal Medicine

## 2017-11-27 ENCOUNTER — Other Ambulatory Visit: Payer: Medicare Other

## 2017-11-27 ENCOUNTER — Ambulatory Visit (INDEPENDENT_AMBULATORY_CARE_PROVIDER_SITE_OTHER)
Admission: RE | Admit: 2017-11-27 | Discharge: 2017-11-27 | Disposition: A | Discharge status: Home / Self Care | Payer: Medicare Other | Source: Ambulatory Visit | Attending: Internal Medicine | Admitting: Internal Medicine

## 2017-11-27 VITALS — BP 118/80 | HR 112 | Temp 98.9°F | Ht <= 58 in | Wt 190.0 lb

## 2017-11-27 DIAGNOSIS — N390 Urinary tract infection, site not specified: Secondary | ICD-10-CM | POA: Diagnosis not present

## 2017-11-27 DIAGNOSIS — R112 Nausea with vomiting, unspecified: Secondary | ICD-10-CM | POA: Diagnosis not present

## 2017-11-27 DIAGNOSIS — R05 Cough: Secondary | ICD-10-CM

## 2017-11-27 DIAGNOSIS — R059 Cough, unspecified: Secondary | ICD-10-CM

## 2017-11-27 DIAGNOSIS — J4530 Mild persistent asthma, uncomplicated: Secondary | ICD-10-CM | POA: Diagnosis not present

## 2017-11-27 LAB — POCT URINALYSIS DIPSTICK
BILIRUBIN UA: NEGATIVE
Blood, UA: NEGATIVE
GLUCOSE UA: NEGATIVE
KETONES UA: NEGATIVE
Nitrite, UA: NEGATIVE
PH UA: 6 (ref 5.0–8.0)
PROTEIN UA: NEGATIVE
Spec Grav, UA: >=1.03 — AB (ref 1.010–1.025)
UROBILINOGEN UA: 0.2 U/dL

## 2017-11-27 NOTE — Assessment & Plan Note (Signed)
U/A done in the office without signs of infection so urine culture is ordered. As we previously talked to mom about there could be some behavioral component here to the wetting with the change in environment and stability. She is not having any signs that make me suspect UTI today. No antibiotics unless significant growth on culture due to many antibiotics in the last year.

## 2017-11-27 NOTE — Progress Notes (Signed)
   Subjective:    Patient ID: Lori Miles, female    DOB: [birth year], 22 y.o.   MRN: [id number]  HPI The patient is a 22 YO female coming in with her mother (who provides all history) with several concerns including sore throat (started in the last week or so, they have been around several sick contacts, denies fevers or chills, taking her allergy medicines, taking her nebulizers and using albuterol more often due to wheezing per mom), recurrent UTI (not wetting as much per mom, does notice some odor to pull ups, denies any burning or pain from patient while urinating, denies fevers or chills at home), nausea and vomiting (still vomiting some since last visit, using zofran with good success, still living with patient's aunt and has no bed but sleeping on couch, denies blood in vomit, appetite is fairly normal, weight stable).   Review of Systems  Unable to perform ROS: Patient nonverbal      Objective:   Physical Exam  Constitutional: She appears well-developed and well-nourished.  No acute distress, playing on ipad during the visit  HENT:  Head: Normocephalic and atraumatic.  Oropharynx with redness, no sinus tenderness. TMs normal.   Eyes: EOM are normal.  Neck: Normal range of motion.  Cardiovascular: Regular rhythm.  HR fast   Pulmonary/Chest: Effort normal and breath sounds normal. No respiratory distress. She has no wheezes. She has no rales.  Abdominal: Soft. Bowel sounds are normal. She exhibits no distension. There is no tenderness. There is no rebound.  Musculoskeletal: She exhibits no edema.  Neurological: She is alert. Coordination normal.  Responds to direct questions and follows direction on exam  Skin: Skin is warm and dry.  Psychiatric:  Appears non-ill on exam and playing on ipad during visit.    Vitals:   11/27/17 0844  BP: 118/80  Pulse: (!) 112  Temp: 98.9 F (37.2 C)  TempSrc: Oral  SpO2: 98%  Weight: 190 lb (86.2 kg)  Height: 4\' 9"  (1.448 m)        Assessment & Plan:

## 2017-11-27 NOTE — Assessment & Plan Note (Signed)
Do not suspect that this is related to an infection but suspect that this is behavioral due to recent stress and lifestyle change with move and housing instability. Rx for zofran for nausea.

## 2017-11-27 NOTE — Telephone Encounter (Signed)
I received a call back from Coty with [facility]. She faxed me a [facility] complete [facility]. I completed the form and faxed it back to her. TG

## 2017-11-27 NOTE — Patient Instructions (Signed)
We are checking the urine culture and the chest x-ray today.

## 2017-11-27 NOTE — Assessment & Plan Note (Signed)
No wheezing on exam and lungs sound clear. Will check CXR. Suspect that allergies are causing her symptoms or viral illness. Previously we have heard "wheezing" of the patient which mom thinks is wheezing but which is actually upper airway noise from allergies and advised that they return to allergist. She will not take nose medications.

## 2017-11-28 LAB — URINE CULTURE
MICRO NUMBER:: 91094146
SPECIMEN QUALITY:: ADEQUATE

## 2017-11-30 ENCOUNTER — Emergency Department (HOSPITAL_COMMUNITY)
Admission: EM | Admit: 2017-11-30 | Discharge: 2017-11-30 | Disposition: A | Discharge status: Home / Self Care | Payer: Medicare Other | Attending: Emergency Medicine | Admitting: Emergency Medicine

## 2017-11-30 ENCOUNTER — Other Ambulatory Visit: Payer: Self-pay

## 2017-11-30 DIAGNOSIS — N390 Urinary tract infection, site not specified: Secondary | ICD-10-CM | POA: Insufficient documentation

## 2017-11-30 DIAGNOSIS — R070 Pain in throat: Secondary | ICD-10-CM | POA: Diagnosis present

## 2017-11-30 DIAGNOSIS — J45909 Unspecified asthma, uncomplicated: Secondary | ICD-10-CM | POA: Diagnosis not present

## 2017-11-30 DIAGNOSIS — Z79899 Other long term (current) drug therapy: Secondary | ICD-10-CM | POA: Diagnosis not present

## 2017-11-30 DIAGNOSIS — J069 Acute upper respiratory infection, unspecified: Secondary | ICD-10-CM

## 2017-11-30 LAB — URINALYSIS, ROUTINE W REFLEX MICROSCOPIC
BILIRUBIN URINE: NEGATIVE
Bacteria, UA: NONE SEEN
GLUCOSE, UA: 150 mg/dL — AB
Ketones, ur: 20 mg/dL — AB
NITRITE: NEGATIVE
Protein, ur: 30 mg/dL — AB
Specific Gravity, Urine: 1.03 (ref 1.005–1.030)
pH: 5 (ref 5.0–8.0)

## 2017-11-30 MED ORDER — CEPHALEXIN 500 MG PO CAPS: 500.0000 mg | ORAL_CAPSULE | Freq: Three times a day (TID) | ORAL | 0 refills | Status: DC

## 2017-11-30 MED ORDER — OXYMETAZOLINE HCL 0.05 % NA SOLN
1.0000 | Freq: Once | NASAL | Status: DC
  Filled 2017-11-30: qty 15

## 2017-11-30 MED ORDER — CEPHALEXIN 500 MG PO CAPS
500.0000 mg | ORAL_CAPSULE | Freq: Once | ORAL | Status: AC
  Administered 2017-11-30: 500 mg via ORAL
  Filled 2017-11-30: qty 1

## 2017-11-30 NOTE — ED Triage Notes (Addendum)
Pt to ed with complaints of sneezing, throwing up, and wetting herself. Pt guardian states that when she is having all these symptoms she usually has a UTI. Pt guardian states Pt has been having these symptoms for over a week now, tried going to Dr Isidore Moosffice but didn't hear back any results of urine test or chest x-ray. Pt did state her self "her throat hurt"  Pt has hx of asthma, developmental delay, and seizures. A & O to her baseline and tachycardic.

## 2017-11-30 NOTE — ED Provider Notes (Signed)
[facility]-EMERGENCY DEPT Provider Note   CSN: [id number] Arrival date & time: 11/30/17  1858     History   Chief Complaint Chief Complaint  Patient presents with  . Urinary Tract Infection  . Sore Throat    HPI Lori Miles is a 22 y.o. female.  HPI Patient with history of developmental delay and is nonverbal.  Level 5 caveat applies.  Brought in by mother.  Mother states patient has frequent urinary tract infections that do not show evidence of infection on UA.  Believes that she has another urinary tract infection due to urinary incontinence.  Mother also states that patient always gets a sinus infection when he she gets a urinary tract infection.  She is having nasal congestion and intermittent wheezing.  Patient also is having intermittent vomiting after gagging on secretions.  No blood in vomit or stool.  Mother states patient has had subjective fevers but no definitive fever.  Was seen by her primary care doctor 2 days ago where she had a chest x-ray and UA.  No evidence of infection.  Urine was sent for culture.  Patient's mother is adamant that the patient needs an antibiotic.  Patient is asking for chips. Past Medical History:  Diagnosis Date  . Asthma   . Autism    mom states no actual dx, but admits she has some symptoms  . Bacteriuria   . Chronic constipation   . Cognitive developmental delay   . Disruptive behavior disorder   . Family history of adverse reaction to anesthesia    mother had itching after anesthesia one time  . Frequency-urgency syndrome   . Gait disorder    ORGANIC PER NERUOLGIST NOTE (DR HICKLING)  . 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 GERD (gastroesophageal reflux disease)   . Gout   . H/O sinus tachycardia   . History of acute respiratory failure 02/14/2015   SECONDARY TO CAP  . History of recurrent UTIs   . Interstitial cystitis   . Moderate intellectual disability   . OSA  (obstructive sleep apnea)    no machine yet   . Partial epilepsy with impairment of consciousness (HCC)    FOLLOWED BY DR HICKLING  . Pneumonia 2018 last time  . PONV (postoperative nausea and vomiting)   . Seizure (HCC)    most recent sz " at the end of summer"-last sz years ago per mother  . Urinary incontinence     Patient Active Problem List   Diagnosis Date Noted  . Allergic rhinitis 04/03/2017  . Recurrent falls 03/21/2017  . Weakness 03/21/2017  . Venous insufficiency 03/21/2017  . Obstructive sleep apnea 09/30/2016  . Excessive daytime sleepiness 03/05/2016  . 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 Cough variant asthma vs UACS/ vcd  03/15/2015  . Autism spectrum disorder 02/14/2015  . Nausea with vomiting 02/14/2015  . Obesity, morbid (HCC) 12/13/2014  . 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 Acanthosis nigricans 11/14/2014  . Toeing-in 08/29/2014  . 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 Recurrent urinary tract infection 08/01/2011  . Asthma 08/01/2011  . Chronic constipation 08/01/2011  . Contraception 08/01/2011    Past Surgical History:  Procedure Laterality Date  . CYSTOSCOPY N/A 06/03/2017   Procedure: CYSTOSCOPY WITH EXAM UNDER ANESTHESIA;  Surgeon: Mena Goes,  Molli HazardMatthew, MD;  [facility];  Service: Urology;  Laterality: N/A;  . CYSTOSCOPY WITH HYDRODISTENSION AND BIOPSY  04/14/2012   Procedure: CYSTOSCOPY/BIOPSY/HYDRODISTENSION;  Surgeon: Lindaann SloughMarc-Henry Nesi, MD;  Location:  [facility];  Service: Urology;;  instillation of marcaine and pyridium  . EUA/  PAP SMEAR/  BREAST EXAM  03-12-2016   dr [facility]way[facility]  . EUA/ VAGINOSCOPY/ COLPOSCOPY FO THE HYMENAL RING  10-04-2009    dr Tamela Odd[facility]ackson-moore  [facility]    . TONSILLECTOMY       OB History   None      Home Medications    Prior to Admission medications   Medication Sig Start Date End Date Taking? Authorizing Provider  acetaminophen (TYLENOL) 325 MG tablet Take 2 tablets (650 mg total) by mouth every 6 (six) hours as needed for mild pain or moderate pain. 02/27/17  Yes Everlene Farrieransie, William, PA-C  albuterol (PROVENTIL) (2.5 MG/3ML) 0.083% nebulizer solution INHALE CONTENTS OF 1 VIAL IN NEBULIZER EVERY 4 TO 6 HOURS IF NEEDED FOR COUGH OR WHEEZING 07/07/17  Yes Kozlow, Alvira PhilipsEric J, MD  budesonide (PULMICORT) 1 MG/2ML nebulizer solution Take 2 mLs (1 mg total) by nebulization 2 (two) times daily. 11/12/17  Yes Kozlow, Alvira PhilipsEric J, MD  cetirizine (ZYRTEC) 10 MG tablet TAKE 1 TABLET BY MOUTH EVERY DAY FOR RUNNY NOSE OR ITCHING. NEED OFFICE VISIT FOR REFILLS Patient taking differently: Take 10 mg by mouth daily.  10/31/17  Yes Kozlow, Alvira PhilipsEric J, MD  cloNIDine (CATAPRES) 0.1 MG tablet TAKE 1 AND 1/2 TABLET BY MOUTH AT BEDTIME Patient taking differently: Take 0.15 mg by mouth at bedtime.  11/11/17  Yes Elveria RisingGoodpasture, Tina, NP  DEPAKOTE 500 MG DR tablet Give 1 tablet in the morning and 2 tablets at night Patient taking differently: Take 500-1,000 mg by mouth See admin instructions.  09/04/17  Yes Elveria RisingGoodpasture, Tina, NP  fluconazole (DIFLUCAN) 100 MG tablet TAKE 1 TABLET BY MOUTH DAILY Patient taking differently: Take 100 mg by mouth daily.  08/21/17  Yes Brock BadHarper, Charles A, MD  Levonorgestrel-Ethinyl Estradiol (SEASONIQUE) 0.15-0.03 &0.01 MG tablet Take 1 tablet by mouth daily.   Yes [provider]  Melatonin 3 MG TABS Take 3 mg by mouth at bedtime. For insomnia   Yes [provider]  mometasone (NASONEX) 50 MCG/ACT nasal spray Place 2 sprays into the nose daily. 09/30/17  Yes Kozlow, Alvira PhilipsEric J, MD  montelukast (SINGULAIR) 10 MG tablet TAKE 1 TABLET BY MOUTH EVERY EVENING TO PREVENT COUGH OR WHEEZING NEEDS AN OFFICE VISIT FOR FURTHER REFILLS Patient taking  differently: Take 10 mg by mouth daily.  10/31/17  Yes Kozlow, Alvira PhilipsEric J, MD  ondansetron (ZOFRAN-ODT) 4 MG disintegrating tablet DISSOLVE 1 TABLET(4 MG) ON THE TONGUE EVERY 8 HOURS AS NEEDED FOR NAUSEA OR VOMITING Patient taking differently: Take 4 mg by mouth every 8 (eight) hours as needed for nausea or vomiting.  10/22/17  Yes Myrlene Brokerrawford, Elizabeth A, MD  PERFOROMIST 20 MCG/2ML nebulizer solution PLACE 2 MLS BY NEBULIZATION 2 TIMES DAILY Patient taking differently: Take 20 mcg by nebulization 2 (two) times daily.  09/01/17  Yes Kozlow, Alvira PhilipsEric J, MD  polyethylene glycol (MIRALAX / GLYCOLAX) packet Take 17 g by mouth 2 (two) times daily.   Yes [provider]  promethazine (PHENERGAN) 25 MG suppository Place 1 suppository (25 mg total) rectally every 6 (six) hours as needed for nausea or vomiting. 07/10/17  Yes Nanavati, Ankit, MD  ranitidine (ZANTAC) 150 MG tablet Take 1 tablet (150 mg total) by  mouth 2 (two) times daily. Pharmacy-please d/c rx for 300 mg dosing 11/12/17  Yes Pyrtle, Carie Caddy, MD  senna (SENOKOT) 8.6 MG tablet Take 1 tablet by mouth at bedtime.    Yes [provider]  terconazole (TERAZOL 3) 0.8 % vaginal cream Place 1 applicator vaginally daily as needed (yeast).  11/24/17  Yes [provider]  traZODone (DESYREL) 50 MG tablet Take 2 tablets (100 mg total) by mouth at bedtime. 11/11/17  Yes Elveria Rising, NP  cephALEXin (KEFLEX) 500 MG capsule Take 1 capsule (500 mg total) by mouth 3 (three) times daily. 11/30/17   Loren Racer, MD    Family History Family History  Problem Relation Age of Onset  . Seizures Mother   . Seizures Sister        1 sister has  . Pancreatic cancer Sister   . Colon cancer Neg Hx   . Colon polyps Neg Hx   . Esophageal cancer Neg Hx   . Rectal cancer Neg Hx   . Stomach cancer Neg Hx     Social History Social History   Tobacco Use  . Smoking status: Never Smoker  . Smokeless tobacco: Never Used  Substance Use Topics  .  Alcohol use: No    Alcohol/week: 0.0 standard drinks  . Drug use: No     Allergies   Abilify [aripiprazole]; Seroquel [quetiapine fumarate]; Doxycycline; Amoxicillin-pot clavulanate; and Keppra [levetiracetam]   Review of Systems Review of Systems  Unable to perform ROS: Psychiatric disorder     Physical Exam Updated Vital Signs BP (!) 111/57 (BP Location: Left Arm)   Pulse (!) 114   Temp 98.3 F (36.8 C) (Oral)   Resp 18   Ht 4\' 9"  (1.448 m)   Wt 83.9 kg   SpO2 99%   BMI 40.03 kg/m   Physical Exam  Constitutional: She appears well-developed and well-nourished. No distress.  Patient is resting.  HENT:  Head: Normocephalic and atraumatic.  Mouth/Throat: Oropharynx is clear and moist. No oropharyngeal exudate.  Bilateral nasal mucosal edema.  Oropharynx is mildly erythematous.  Eyes: Pupils are equal, round, and reactive to light. EOM are normal.  Neck: Normal range of motion. Neck supple. No JVD present.  No meningismus.  Cardiovascular: Normal rate and regular rhythm. Exam reveals no gallop and no friction rub.  No murmur heard. Pulmonary/Chest: Effort normal and breath sounds normal.  Lungs are clear.  No respiratory distress.  Abdominal: Soft. Bowel sounds are normal. There is no tenderness. There is no rebound and no guarding.  Musculoskeletal: Normal range of motion. She exhibits no edema or tenderness.  No CVA tenderness bilaterally.  Distal pulses are 2+.  Lymphadenopathy:    She has no cervical adenopathy.  Neurological: She is alert.  Following commands.  Moving all extremities without deficit.  Sensation intact.  Skin: Skin is warm and dry. Capillary refill takes less than 2 seconds. No rash noted. She is not diaphoretic. No erythema.  Nursing note and vitals reviewed.    ED Treatments / Results  Labs (all labs ordered are listed, but only abnormal results are displayed) Labs Reviewed  URINALYSIS, ROUTINE W REFLEX MICROSCOPIC - Abnormal; Notable for  the following components:      Result Value   Glucose, UA 150 (*)    Hgb urine dipstick SMALL (*)    Ketones, ur 20 (*)    Protein, ur 30 (*)    Leukocytes, UA LARGE (*)    All other components within normal  limits  URINE CULTURE    EKG None  Radiology No results found.  Procedures Procedures (including critical care time)  Medications Ordered in ED Medications  oxymetazoline (AFRIN) 0.05 % nasal spray 1 spray (has no administration in time range)  cephALEXin (KEFLEX) capsule 500 mg (has no administration in time range)     Initial Impression / Assessment and Plan / ED Course  I have reviewed the triage vital signs and the nursing notes.  Pertinent labs & imaging results that were available during my care of the patient were reviewed by me and considered in my medical decision making (see chart for details).     Patient is very well-appearing.  Likely URI/sinus disease. Large leukocytes and white blood cell in urine.  Urine sent for culture.  Will start on antibiotic.  Patient's vomiting appears to be posttussive.  Abdominal exam is benign.  Advised to follow-up with her urologist and her primary physician.  Return precautions given. Final Clinical Impressions(s) / ED Diagnoses   Final diagnoses:  Lower urinary tract infectious disease  Viral upper respiratory tract infection    ED Discharge Orders         Ordered    cephALEXin (KEFLEX) 500 MG capsule  3 times daily     11/30/17 2227           Loren Racer, MD 11/30/17 2232

## 2017-12-01 ENCOUNTER — Telehealth: Payer: Self-pay

## 2017-12-01 NOTE — Telephone Encounter (Signed)
fyi

## 2017-12-01 NOTE — Telephone Encounter (Signed)
Copied from CRM [phone]. Topic: General - Other >> Dec 01, 2017 10:00 AM Arlyss Gandyichardson, Taren N, NT wrote: Reason for CRM: Patients mom calling and states that she left a message about an issue they had at the ER last night during her daughters visit. She states that they were able to get a antibiotic for her daughters UTI from the ER doctor last night. She wanted to make Dr. Okey Duprerawford aware.

## 2017-12-02 ENCOUNTER — Telehealth: Payer: Self-pay | Admitting: Allergy and Immunology

## 2017-12-02 LAB — URINE CULTURE

## 2017-12-02 MED ORDER — BENZONATATE 100 MG PO CAPS: 200.0000 mg | ORAL_CAPSULE | Freq: Two times a day (BID) | ORAL | 0 refills | Status: DC | PRN

## 2017-12-02 NOTE — Telephone Encounter (Signed)
Pt mom called and said that she is coughing really bad and needs to have Tessalon called into [facility] on [address]. [phone]

## 2017-12-02 NOTE — Telephone Encounter (Signed)
Dr. Lucie LeatherKozlow is this okay to do or no?

## 2017-12-02 NOTE — Telephone Encounter (Signed)
Please use previous prescription / renewal

## 2017-12-02 NOTE — Telephone Encounter (Signed)
Previous prescription was refilled as per instructed.

## 2017-12-02 NOTE — Telephone Encounter (Signed)
Can call in tessalon

## 2017-12-02 NOTE — Telephone Encounter (Signed)
Please advise on dose 100 or 200 how much to dispense

## 2017-12-03 ENCOUNTER — Emergency Department (HOSPITAL_COMMUNITY)
Admission: EM | Admit: 2017-12-03 | Discharge: 2017-12-04 | Disposition: A | Discharge status: Home / Self Care | Payer: Medicare Other | Attending: Emergency Medicine | Admitting: Emergency Medicine

## 2017-12-03 ENCOUNTER — Encounter (HOSPITAL_BASED_OUTPATIENT_CLINIC_OR_DEPARTMENT_OTHER): Payer: Medicare Other

## 2017-12-03 ENCOUNTER — Encounter (HOSPITAL_COMMUNITY): Payer: Self-pay

## 2017-12-03 DIAGNOSIS — Z79899 Other long term (current) drug therapy: Secondary | ICD-10-CM | POA: Insufficient documentation

## 2017-12-03 DIAGNOSIS — R112 Nausea with vomiting, unspecified: Secondary | ICD-10-CM | POA: Insufficient documentation

## 2017-12-03 DIAGNOSIS — J45909 Unspecified asthma, uncomplicated: Secondary | ICD-10-CM | POA: Insufficient documentation

## 2017-12-03 DIAGNOSIS — J189 Pneumonia, unspecified organism: Secondary | ICD-10-CM | POA: Insufficient documentation

## 2017-12-03 DIAGNOSIS — R05 Cough: Secondary | ICD-10-CM | POA: Diagnosis present

## 2017-12-03 DIAGNOSIS — J181 Lobar pneumonia, unspecified organism: Secondary | ICD-10-CM

## 2017-12-03 LAB — URINALYSIS, ROUTINE W REFLEX MICROSCOPIC
BILIRUBIN URINE: NEGATIVE
Glucose, UA: 50 mg/dL — AB
Hgb urine dipstick: NEGATIVE
KETONES UR: 5 mg/dL — AB
LEUKOCYTES UA: NEGATIVE
Nitrite: NEGATIVE
PH: 7 (ref 5.0–8.0)
Protein, ur: NEGATIVE mg/dL
Specific Gravity, Urine: 1.023 (ref 1.005–1.030)

## 2017-12-03 LAB — COMPREHENSIVE METABOLIC PANEL
ALT: 17 U/L (ref 0–44)
ANION GAP: 10 (ref 5–15)
AST: 22 U/L (ref 15–41)
Albumin: 2.6 g/dL — ABNORMAL LOW (ref 3.5–5.0)
Alkaline Phosphatase: 52 U/L (ref 38–126)
BILIRUBIN TOTAL: 0.4 mg/dL (ref 0.3–1.2)
BUN: 6 mg/dL (ref 6–20)
CO2: 22 mmol/L (ref 22–32)
Calcium: 8.6 mg/dL — ABNORMAL LOW (ref 8.9–10.3)
Chloride: 105 mmol/L (ref 98–111)
Creatinine, Ser: 0.75 mg/dL (ref 0.44–1.00)
GFR calc Af Amer: >60 mL/min (ref >60)
GFR calc non Af Amer: >60 mL/min (ref >60)
Glucose, Bld: 103 mg/dL — ABNORMAL HIGH (ref 70–99)
POTASSIUM: 4.2 mmol/L (ref 3.5–5.1)
Sodium: 137 mmol/L (ref 135–145)
TOTAL PROTEIN: 5.6 g/dL — AB (ref 6.5–8.1)

## 2017-12-03 LAB — CBC
HEMATOCRIT: 35.5 % — AB (ref 36.0–46.0)
Hemoglobin: 11.2 g/dL — ABNORMAL LOW (ref 12.0–15.0)
MCH: 29.6 pg (ref 26.0–34.0)
MCHC: 31.5 g/dL (ref 30.0–36.0)
MCV: 93.7 fL (ref 78.0–100.0)
PLATELETS: 240 10*3/uL (ref 150–400)
RBC: 3.79 MIL/uL — ABNORMAL LOW (ref 3.87–5.11)
RDW: 13.8 % (ref 11.5–15.5)
WBC: 9.6 10*3/uL (ref 4.0–10.5)

## 2017-12-03 LAB — LIPASE, BLOOD: Lipase: 32 U/L (ref 11–51)

## 2017-12-03 LAB — I-STAT BETA HCG BLOOD, ED (MC, WL, AP ONLY): I-stat hCG, quantitative: <5 m[IU]/mL (ref <5)

## 2017-12-03 MED ORDER — SODIUM CHLORIDE 0.9 % IV BOLUS (SEPSIS)
1000.0000 mL | Freq: Once | INTRAVENOUS | Status: AC
  Administered 2017-12-04: 1000 mL via INTRAVENOUS

## 2017-12-03 MED ORDER — ONDANSETRON HCL 4 MG/2ML IJ SOLN
4.0000 mg | Freq: Once | INTRAMUSCULAR | Status: AC
  Administered 2017-12-04: 4 mg via INTRAVENOUS
  Filled 2017-12-03: qty 2

## 2017-12-03 NOTE — ED Triage Notes (Signed)
Pt has been seen multiple times for URI and UTI, pt [facility]. States that she has been fevers, vomiting and incontinent of urine, coughing up blood, sore throat.

## 2017-12-03 NOTE — ED Provider Notes (Signed)
TIME SEEN: 11:32 PM  CHIEF COMPLAINT: Flulike symptoms  HPI: Patient is a 22 year old female with history of autism, epilepsy who presents to the emergency department with over 1 week of dry cough, intermittent nausea, vomiting and diarrhea, chills.  Also having urinary incontinence.  Was seen by primary care physician last week and was told to have a normal urinalysis and chest x-ray.  [facility] and was diagnosed with a UTI and started on Keflex.  Mother reports persistent symptoms despite antibiotics.  No known sick contacts.  ROS: Level 5 caveat secondary to autism and cognitive delay  PAST MEDICAL HISTORY/PAST SURGICAL HISTORY:  Past Medical History:  Diagnosis Date  . Asthma   . Autism    mom states no actual dx, but admits she has some symptoms  . Bacteriuria   . Chronic constipation   . Cognitive developmental delay   . Disruptive behavior disorder   . Family history of adverse reaction to anesthesia    mother had itching after anesthesia one time  . Frequency-urgency syndrome   . Gait disorder    ORGANIC PER NERUOLGIST NOTE (DR HICKLING)  . 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 GERD (gastroesophageal reflux disease)   . Gout   . H/O sinus tachycardia   . History of acute respiratory failure 02/14/2015   SECONDARY TO CAP  . History of recurrent UTIs   . Interstitial cystitis   . Moderate intellectual disability   . OSA (obstructive sleep apnea)    no machine yet   . Partial epilepsy with impairment of consciousness (HCC)    FOLLOWED BY DR HICKLING  . Pneumonia 2018 last time  . PONV (postoperative nausea and vomiting)   . Seizure (HCC)    most recent sz " at the end of summer"-last sz years ago per mother  . Urinary incontinence     MEDICATIONS:  Prior to Admission medications   Medication Sig Start Date End Date Taking? Authorizing Provider  acetaminophen (TYLENOL) 325 MG tablet Take 2  tablets (650 mg total) by mouth every 6 (six) hours as needed for mild pain or moderate pain. 02/27/17   Everlene Farrieransie, William, PA-C  albuterol (PROVENTIL) (2.5 MG/3ML) 0.083% nebulizer solution INHALE CONTENTS OF 1 VIAL IN NEBULIZER EVERY 4 TO 6 HOURS IF NEEDED FOR COUGH OR WHEEZING 07/07/17   Kozlow, Alvira PhilipsEric J, MD  benzonatate (TESSALON) 100 MG capsule Take 2 capsules (200 mg total) by mouth 2 (two) times daily as needed for cough. 12/02/17   Kozlow, Alvira PhilipsEric J, MD  budesonide (PULMICORT) 1 MG/2ML nebulizer solution Take 2 mLs (1 mg total) by nebulization 2 (two) times daily. 11/12/17   Kozlow, Alvira PhilipsEric J, MD  cephALEXin (KEFLEX) 500 MG capsule Take 1 capsule (500 mg total) by mouth 3 (three) times daily. 11/30/17   Loren RacerYelverton, David, MD  cetirizine (ZYRTEC) 10 MG tablet TAKE 1 TABLET BY MOUTH EVERY DAY FOR RUNNY NOSE OR ITCHING. NEED OFFICE VISIT FOR REFILLS Patient taking differently: Take 10 mg by mouth daily.  10/31/17   Kozlow, Alvira PhilipsEric J, MD  cloNIDine (CATAPRES) 0.1 MG tablet TAKE 1 AND 1/2 TABLET BY MOUTH AT BEDTIME Patient taking differently: Take 0.15 mg by mouth at bedtime.  11/11/17   Elveria RisingGoodpasture, Tina, NP  DEPAKOTE 500 MG DR tablet Give 1 tablet in the morning and 2 tablets at night Patient taking differently: Take 500-1,000 mg by mouth See admin instructions.  09/04/17   Elveria RisingGoodpasture, Tina, NP  fluconazole (DIFLUCAN) 100 MG tablet TAKE 1 TABLET BY MOUTH DAILY Patient taking differently: Take 100 mg by mouth daily.  08/21/17   Brock Bad, MD  Levonorgestrel-Ethinyl Estradiol (SEASONIQUE) 0.15-0.03 &0.01 MG tablet Take 1 tablet by mouth daily.    [provider]  Melatonin 3 MG TABS Take 3 mg by mouth at bedtime. For insomnia    [provider]  mometasone (NASONEX) 50 MCG/ACT nasal spray Place 2 sprays into the nose daily. 09/30/17   Kozlow, Alvira Philips, MD  montelukast (SINGULAIR) 10 MG tablet TAKE 1 TABLET BY MOUTH EVERY EVENING TO PREVENT COUGH OR WHEEZING NEEDS AN OFFICE VISIT FOR FURTHER  REFILLS Patient taking differently: Take 10 mg by mouth daily.  10/31/17   Kozlow, Alvira Philips, MD  ondansetron (ZOFRAN-ODT) 4 MG disintegrating tablet DISSOLVE 1 TABLET(4 MG) ON THE TONGUE EVERY 8 HOURS AS NEEDED FOR NAUSEA OR VOMITING Patient taking differently: Take 4 mg by mouth every 8 (eight) hours as needed for nausea or vomiting.  10/22/17   Myrlene Broker, MD  PERFOROMIST 20 MCG/2ML nebulizer solution PLACE 2 MLS BY NEBULIZATION 2 TIMES DAILY Patient taking differently: Take 20 mcg by nebulization 2 (two) times daily.  09/01/17   Kozlow, Alvira Philips, MD  polyethylene glycol (MIRALAX / Ethelene Hal) packet Take 17 g by mouth 2 (two) times daily.    [provider]  promethazine (PHENERGAN) 25 MG suppository Place 1 suppository (25 mg total) rectally every 6 (six) hours as needed for nausea or vomiting. 07/10/17   Derwood Kaplan, MD  ranitidine (ZANTAC) 150 MG tablet Take 1 tablet (150 mg total) by mouth 2 (two) times daily. Pharmacy-please d/c rx for 300 mg dosing 11/12/17   Pyrtle, Carie Caddy, MD  senna (SENOKOT) 8.6 MG tablet Take 1 tablet by mouth at bedtime.     [provider]  terconazole (TERAZOL 3) 0.8 % vaginal cream Place 1 applicator vaginally daily as needed (yeast).  11/24/17   [provider]  traZODone (DESYREL) 50 MG tablet Take 2 tablets (100 mg total) by mouth at bedtime. 11/11/17   Elveria Rising, NP    ALLERGIES:  Allergies  Allergen Reactions  . Abilify [Aripiprazole] Swelling and Palpitations  . Seroquel [Quetiapine Fumarate] Palpitations  . Doxycycline Other (See Comments)    Stomach cramps  . Amoxicillin-Pot Clavulanate Hives    Has patient had a PCN reaction causing immediate rash, facial/tongue/throat swelling, SOB or lightheadedness with hypotension: No Has patient had a PCN reaction causing severe rash involving mucus membranes or skin necrosis: No Has patient had a PCN reaction that required hospitalization: No Has patient had a PCN reaction  occurring within the last 10 years: No If all of the above answers are "NO", then may proceed with Cephalosporin use.   Marland Kitchen Keppra [Levetiracetam] Other (See Comments)    Aggressive behavior     SOCIAL HISTORY:  Social History   Tobacco Use  . Smoking status: Never Smoker  . Smokeless tobacco: Never Used  Substance Use Topics  . Alcohol use: No    Alcohol/week: 0.0 standard drinks    FAMILY HISTORY: Family History  Problem Relation Age of Onset  . Seizures Mother   . Seizures Sister        1 sister has  . Pancreatic cancer Sister   . Colon cancer Neg Hx   . Colon polyps Neg Hx   . Esophageal cancer Neg Hx   . Rectal cancer Neg Hx   . Stomach cancer Neg  Hx     EXAM: BP 96/69 (BP Location: Right Arm)   Pulse 98   Temp 98.6 F (37 C) (Oral)   Resp 16   SpO2 97%  CONSTITUTIONAL: Alert and pleasant.  Well-appearing, nontoxic, appears well-hydrated. HEAD: Normocephalic EYES: Conjunctivae clear, pupils appear equal, EOMI ENT: normal nose; moist mucous membranes; and has pharyngeal erythema without petechiae,, no tonsillar hypertrophy or exudate, no uvular deviation, no unilateral swelling, no trismus or drooling, no muffled voice, normal phonation, no stridor, no dental caries present, no drainable dental abscess noted, no Ludwig's angina, tongue sits flat in the bottom of the mouth, no angioedema, no facial erythema or warmth, no facial swelling; no pain with movement of the neck. NECK: Supple, no meningismus, no nuchal rigidity, no LAD  CARD: RRR; S1 and S2 appreciated; no murmurs, no clicks, no rubs, no gallops RESP: Normal chest excursion without splinting or tachypnea; breath sounds clear and equal bilaterally; no wheezes, no rhonchi, no rales, no hypoxia or respiratory distress, speaking full sentences ABD/GI: Normal bowel sounds; non-distended; soft, non-tender, no rebound, no guarding, no peritoneal signs, no hepatosplenomegaly BACK:  The back appears normal and is  non-tender to palpation, there is no CVA tenderness EXT: Normal ROM in all joints; non-tender to palpation; no edema; normal capillary refill; no cyanosis, no calf tenderness or swelling    SKIN: Normal color for age and race; warm; no rash NEURO: Moves all extremities equally PSYCH: The patient's mood and manner are appropriate. Grooming and personal hygiene are appropriate.  MEDICAL DECISION MAKING: Patient here with flulike symptoms.  Labs are unremarkable.  Urine previously showed large leukocytes but culture grew many bacteria.  Today urine looks great.  Her previous cultures have grown Proteus and Klebsiella that were sensitive to cephalosporins and penicillins.  Have advised family to continue Keflex.  Mother requesting repeat chest x-ray today.  We will also obtain flu swab and strep swab.  Will give IV fluids, Zofran for symptomatic relief.  Patient is asking for something to eat.  ED PROGRESS: Patient's strep swab is negative.  Flu swab negative.  Chest x-ray concerning for left lower lobe pneumonia.  Will treat with ceftriaxone and azithromycin.  She has not been hypoxic in the ED. family comfortable with taking patient home.  Will discharge on azithromycin and have them continue Keflex.  They have a PCP for follow-up.   At this time, I do not feel there is any life-threatening condition present. I have reviewed and discussed all results (EKG, imaging, lab, urine as appropriate) and exam findings with patient/family. I have reviewed nursing notes and appropriate previous records.  I feel the patient is safe to be discharged home without further emergent workup and can continue workup as an outpatient as needed. Discussed usual and customary return precautions. Patient/family verbalize understanding and are comfortable with this plan.  Outpatient follow-up has been provided if needed. All questions have been answered.     Ward, Layla Maw, DO 12/04/17 [phone]

## 2017-12-04 ENCOUNTER — Emergency Department (HOSPITAL_COMMUNITY): Payer: Medicare Other

## 2017-12-04 ENCOUNTER — Telehealth: Payer: Self-pay | Admitting: Pulmonary Disease

## 2017-12-04 DIAGNOSIS — J189 Pneumonia, unspecified organism: Secondary | ICD-10-CM | POA: Diagnosis not present

## 2017-12-04 LAB — INFLUENZA PANEL BY PCR (TYPE A & B)
Influenza A By PCR: NEGATIVE
Influenza B By PCR: NEGATIVE

## 2017-12-04 LAB — GROUP A STREP BY PCR: Group A Strep by PCR: NOT DETECTED

## 2017-12-04 MED ORDER — AZITHROMYCIN 250 MG PO TABS: 250.0000 mg | ORAL_TABLET | Freq: Every day | ORAL | 0 refills | Status: DC

## 2017-12-04 MED ORDER — SODIUM CHLORIDE 0.9 % IV SOLN
500.0000 mg | Freq: Once | INTRAVENOUS | Status: AC
  Administered 2017-12-04: 500 mg via INTRAVENOUS
  Filled 2017-12-04: qty 500

## 2017-12-04 MED ORDER — SODIUM CHLORIDE 0.9 % IV SOLN
1.0000 g | Freq: Once | INTRAVENOUS | Status: AC
  Administered 2017-12-04: 1 g via INTRAVENOUS
  Filled 2017-12-04: qty 10

## 2017-12-04 MED ORDER — ONDANSETRON HCL 4 MG/2ML IJ SOLN
4.0000 mg | Freq: Once | INTRAMUSCULAR | Status: AC
  Administered 2017-12-04: 4 mg via INTRAVENOUS
  Filled 2017-12-04: qty 2

## 2017-12-04 MED ORDER — ONDANSETRON 4 MG PO TBDP: 4.0000 mg | ORAL_TABLET | Freq: Four times a day (QID) | ORAL | 0 refills | Status: DC | PRN

## 2017-12-04 NOTE — Telephone Encounter (Signed)
Spoke with patient's mother, she wanted to make VS aware patient went to ER last night, she as given IV antibiotics and sent home on Zpack. They had to call her asthma doctor to get cough pearls. She said she would call the office back once she spoke with her husband to get her an appoinment. Will route to VS as FYI. Will await call back to get patient scheduled for Hospital follow up.

## 2017-12-04 NOTE — Discharge Instructions (Signed)
Please continue your Keflex until complete.   You may alternate Tylenol 1000 mg every 6 hours as needed for pain and Ibuprofen 800 mg every 8 hours as needed for pain.  Please take Ibuprofen with food.

## 2017-12-10 ENCOUNTER — Ambulatory Visit (HOSPITAL_BASED_OUTPATIENT_CLINIC_OR_DEPARTMENT_OTHER): Payer: Medicare Other | Attending: Pulmonary Disease | Admitting: Pulmonary Disease

## 2017-12-10 VITALS — Ht 59.5 in | Wt 187.0 lb

## 2017-12-10 DIAGNOSIS — E669 Obesity, unspecified: Secondary | ICD-10-CM | POA: Diagnosis not present

## 2017-12-10 DIAGNOSIS — Z6837 Body mass index (BMI) 37.0-37.9, adult: Secondary | ICD-10-CM | POA: Diagnosis not present

## 2017-12-10 DIAGNOSIS — R0683 Snoring: Secondary | ICD-10-CM | POA: Insufficient documentation

## 2017-12-10 DIAGNOSIS — Z79899 Other long term (current) drug therapy: Secondary | ICD-10-CM | POA: Insufficient documentation

## 2017-12-10 DIAGNOSIS — G4733 Obstructive sleep apnea (adult) (pediatric): Secondary | ICD-10-CM

## 2017-12-11 ENCOUNTER — Telehealth: Payer: Self-pay | Admitting: Pulmonary Disease

## 2017-12-11 ENCOUNTER — Ambulatory Visit: Payer: Medicare Other | Admitting: Internal Medicine

## 2017-12-11 NOTE — Telephone Encounter (Signed)
Called and spoke to Patient's Mother.  She stated that the Patient had her split night 12/10/17.  She stated that she was told by the nurse during test that she may not qualify doe a CPAP, but she feels they need it.  She stated that she has had recurrent pneumonias, sinus infections, and UTI"s.  She recently finished a Z Pak prescribed by last ED visit.  Informed her that Dr. Craige Cotta was not in the office this week, and results do take time before they are read, but someone will let her know results as soon as they are available.  Explained that I would send message to Dr. Craige Cotta and Harvin Hazel. Patient's Mother stated understanding.   Will route to Dr. Craige Cotta and Harvin Hazel

## 2017-12-12 DIAGNOSIS — G4733 Obstructive sleep apnea (adult) (pediatric): Secondary | ICD-10-CM | POA: Diagnosis not present

## 2017-12-12 NOTE — Telephone Encounter (Signed)
PSG 12/10/17 >> AJI 0.3, SpO2 low 93%.   Please let her know sleep study did not show sleep apnea.  Please schedule ROV with me to discuss further options to assess her daytime sleepiness.

## 2017-12-12 NOTE — Procedures (Signed)
   Patient Name: Lori Miles, Lori Miles Date: 12/10/2017   Gender: Female  D.O.B: [birth year]  Age (years): 22  Referring Provider: Coralyn Helling MD, ABSM  Height (inches): 60  Interpreting Physician: Coralyn Helling MD, ABSM  Weight (lbs): 187  RPSGT: [name]  BMI: 37  MRN: [id number]  Neck Size: 14.00  <br> <br>  CLINICAL INFORMATION  Sleep Study Type: NPSG Indication for sleep study: Obesity, OSA Epworth Sleepiness Score: 21  Most recent polysomnogram dated 03/27/2016 revealed an AHI of 0.1/h and RDI of 1.1/h. Most recent titration study dated 09/06/2016 was optimal at 10cm H2O with an AHI of 0.2/h.  SLEEP STUDY TECHNIQUE  As per the AASM Manual for the Scoring of Sleep and Associated Events v2.3 (April 2016) with a hypopnea requiring 4% desaturations. The channels recorded and monitored were frontal, central and occipital EEG, electrooculogram (EOG), submentalis EMG (chin), nasal and oral airflow, thoracic and abdominal wall motion, anterior tibialis EMG, snore microphone, electrocardiogram, and pulse oximetry. MEDICATIONS  Medications self-administered by patient taken the night of the study : CLONIDINE, MELATONIN, TRAZODONE SLEEP ARCHITECTURE  The study was initiated at 9:18:59 PM and ended at 4:23:49 AM. Sleep onset time was 9.6 minutes and the sleep efficiency was 97.3%%. The total sleep time was 413.5 minutes. Stage REM latency was 101.5 minutes. The patient spent 0.5%% of the night in stage N1 sleep, 83.2%% in stage N2 sleep, 6.0%% in stage N3 and 10.3% in REM. Alpha intrusion was absent. Supine sleep was 62.36%. RESPIRATORY PARAMETERS  The overall apnea/hypopnea index (AHI) was 0.3 per hour. There were 0 total apneas, including 0 obstructive, 0 central and 0 mixed apneas. There were 2 hypopneas and 1 RERAs. The AHI during Stage REM sleep was 0.0 per hour. AHI while supine was 0.0 per hour. The mean oxygen saturation was 97.9%. The minimum SpO2 during sleep was  93.0%. soft snoring was noted during this study. CARDIAC DATA  The 2 lead EKG demonstrated sinus rhythm. The mean heart rate was 84.5 beats per minute. Other EKG findings include: None.  LEG MOVEMENT DATA  The total PLMS were 0 with a resulting PLMS index of 0.0. Associated arousal with leg movement index was 0.0 . IMPRESSIONS  - No significant obstructive sleep apnea occurred during this study (AHI = 0.3/h). - No significant central sleep apnea occurred during this study (CAI = 0.0/h).  - The patient had minimal or no oxygen desaturation during the study (Min O2 = 93.0%)  - The patient snored with soft snoring volume.  - No cardiac abnormalities were noted during this study.  - Clinically significant periodic limb movements did not occur during sleep. No significant associated arousals. DIAGNOSIS  - Snoring. RECOMMENDATIONS  - Avoid alcohol, sedatives and other CNS depressants that may worsen sleep apnea and disrupt normal sleep architecture.  - Sleep hygiene should be reviewed to assess factors that may improve sleep quality.  - Weight management and regular exercise should be initiated or continued if appropriate. [Electronically signed] 12/12/2017 03:26 PM Coralyn Helling MD, ABSM  Diplomate, American Board of Sleep Medicine  NPI: [id number]

## 2017-12-15 ENCOUNTER — Telehealth: Payer: Self-pay | Admitting: Pulmonary Disease

## 2017-12-15 NOTE — Telephone Encounter (Signed)
Called and spoke with pt's mother, Lori Miles regarding Sleep Study results She advised that the pt's throat closes in her sleep and that her daughter has OSA Advised her of VS results  Note    PSG 12/10/17 >> AJI 0.3, SpO2 low 93%.  Please let her know sleep study did not show sleep apnea.  Please schedule ROV with me to discuss further options to assess her daytime sleepiness.     She is requesting on OV with VS in October 2019; she will call back to schedule with VS. Will f/u with pt later this week.

## 2017-12-15 NOTE — Telephone Encounter (Signed)
Spoke with pt's mother, [name]. She is aware of the pt's sleep study results. She became very argumentative when I relayed the results. Stated, "She does have sleep apney, she does have sleep apney." I attempted to schedule the pt's OV with Dr. Craige Cotta and the pt's mother stated that she would have to call back to schedule this.

## 2017-12-16 NOTE — Telephone Encounter (Signed)
Called and spoke with pt's mother, [name] regarding another appt with NP or VS VS is not available with pt's mother's schedule at this time. Scheduled appt with TP on 12-26-17 at 9:30am [name] is requesting pt be placed on cpap machine as soon as possible. The sleep study completed last week states pt doesn't need cpap machine, Not does she qualify for cpap machine at this time. [name] would like to speak with VS and TP about this concern. Nothing further needed at this time.

## 2017-12-18 ENCOUNTER — Ambulatory Visit (INDEPENDENT_AMBULATORY_CARE_PROVIDER_SITE_OTHER): Payer: Medicare Other

## 2017-12-18 ENCOUNTER — Ambulatory Visit (HOSPITAL_COMMUNITY)
Admission: EM | Admit: 2017-12-18 | Discharge: 2017-12-18 | Disposition: A | Discharge status: Home / Self Care | Payer: Medicare Other | Attending: Family Medicine | Admitting: Family Medicine

## 2017-12-18 ENCOUNTER — Other Ambulatory Visit: Payer: Self-pay

## 2017-12-18 DIAGNOSIS — G4733 Obstructive sleep apnea (adult) (pediatric): Secondary | ICD-10-CM | POA: Insufficient documentation

## 2017-12-18 DIAGNOSIS — M109 Gout, unspecified: Secondary | ICD-10-CM | POA: Insufficient documentation

## 2017-12-18 DIAGNOSIS — R062 Wheezing: Secondary | ICD-10-CM | POA: Diagnosis present

## 2017-12-18 DIAGNOSIS — R05 Cough: Secondary | ICD-10-CM | POA: Diagnosis not present

## 2017-12-18 DIAGNOSIS — J029 Acute pharyngitis, unspecified: Secondary | ICD-10-CM

## 2017-12-18 DIAGNOSIS — F840 Autistic disorder: Secondary | ICD-10-CM | POA: Insufficient documentation

## 2017-12-18 DIAGNOSIS — R32 Unspecified urinary incontinence: Secondary | ICD-10-CM | POA: Diagnosis not present

## 2017-12-18 DIAGNOSIS — F71 Moderate intellectual disabilities: Secondary | ICD-10-CM | POA: Insufficient documentation

## 2017-12-18 DIAGNOSIS — R8290 Unspecified abnormal findings in urine: Secondary | ICD-10-CM | POA: Diagnosis not present

## 2017-12-18 DIAGNOSIS — R82998 Other abnormal findings in urine: Secondary | ICD-10-CM | POA: Diagnosis not present

## 2017-12-18 DIAGNOSIS — Z87440 Personal history of urinary (tract) infections: Secondary | ICD-10-CM | POA: Insufficient documentation

## 2017-12-18 DIAGNOSIS — L83 Acanthosis nigricans: Secondary | ICD-10-CM | POA: Insufficient documentation

## 2017-12-18 DIAGNOSIS — J45909 Unspecified asthma, uncomplicated: Secondary | ICD-10-CM | POA: Diagnosis not present

## 2017-12-18 DIAGNOSIS — K5909 Other constipation: Secondary | ICD-10-CM | POA: Insufficient documentation

## 2017-12-18 DIAGNOSIS — I872 Venous insufficiency (chronic) (peripheral): Secondary | ICD-10-CM | POA: Insufficient documentation

## 2017-12-18 DIAGNOSIS — Z79899 Other long term (current) drug therapy: Secondary | ICD-10-CM | POA: Diagnosis not present

## 2017-12-18 DIAGNOSIS — R569 Unspecified convulsions: Secondary | ICD-10-CM | POA: Insufficient documentation

## 2017-12-18 LAB — POCT URINALYSIS DIP (DEVICE)
GLUCOSE, UA: 100 mg/dL — AB
Hgb urine dipstick: NEGATIVE
Ketones, ur: 15 mg/dL — AB
LEUKOCYTES UA: NEGATIVE
Nitrite: NEGATIVE
PROTEIN: 100 mg/dL — AB
SPECIFIC GRAVITY, URINE: 1.02 (ref 1.005–1.030)
UROBILINOGEN UA: 4 mg/dL — AB (ref 0.0–1.0)
pH: 7 (ref 5.0–8.0)

## 2017-12-18 LAB — POCT RAPID STREP A: Streptococcus, Group A Screen (Direct): NEGATIVE

## 2017-12-18 MED ORDER — LIDOCAINE-EPINEPHRINE (PF) 2 %-1:200000 IJ SOLN
INTRAMUSCULAR | Status: AC
  Filled 2017-12-18: qty 20

## 2017-12-18 MED ORDER — IPRATROPIUM-ALBUTEROL 0.5-2.5 (3) MG/3ML IN SOLN
RESPIRATORY_TRACT | Status: AC
  Filled 2017-12-18: qty 3

## 2017-12-18 MED ORDER — SULFAMETHOXAZOLE-TRIMETHOPRIM 800-160 MG PO TABS: 1.0000 | ORAL_TABLET | Freq: Two times a day (BID) | ORAL | 0 refills | Status: DC

## 2017-12-18 MED ORDER — BENZONATATE 100 MG PO CAPS: 200.0000 mg | ORAL_CAPSULE | Freq: Two times a day (BID) | ORAL | 0 refills | Status: DC | PRN

## 2017-12-18 NOTE — Discharge Instructions (Addendum)
Urine was negative for infection- increase fluids and limit sugary drinks.  Chest x ray was negative for pneumonia Strep negative for infection Continue the inhaler and breathing treatments and tessalon for cough.  Ensure you are taking the zyrtec and Singulair.

## 2017-12-18 NOTE — ED Triage Notes (Signed)
Pt mom states she has been wheezing and sore throat  3 days

## 2017-12-18 NOTE — ED Provider Notes (Signed)
[facility]    CSN: [id number] Arrival date & time: 12/18/17  1410     History   Chief Complaint Chief Complaint  Patient presents with  . Wheezing  . Urinary Tract Infection    HPI Diasia Henken is a 22 y.o. female.   Pt is a 33 year olf female with PMH of asthma, pneumonia, autism, bacteruria. She presents with 3 days of cough, wheezing, drainage and sore throat. Symptoms have been constant and remained the same. She was recently seen and treated with Z pac and Rocephin for left lower lobe PNA in the ER on 12/03/17. She has had some improvement but still a lot of wheezing. No fever. Mom also reports some dark yellow staining in her pull up and foul smell from urine. Urine was checked at previous hospital visit and appeared to be normal and she had already previously been treated for UTI with keflex. Mom asking for repeat x ray to ensure the PNA is not worse and to recheck the urine. They have been using inhalers as prescribed with limited relief and last night she did not sleep well. She has also been taking her allergy medication.   ROS per HPI      Past Medical History:  Diagnosis Date  . Asthma   . Autism    mom states no actual dx, but admits she has some symptoms  . Bacteriuria   . Chronic constipation   . Cognitive developmental delay   . Disruptive behavior disorder   . Family history of adverse reaction to anesthesia    mother had itching after anesthesia one time  . Frequency-urgency syndrome   . Gait disorder    ORGANIC PER NERUOLGIST NOTE (DR HICKLING)  . 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 GERD (gastroesophageal reflux disease)   . Gout   . H/O sinus tachycardia   . History of acute respiratory failure 02/14/2015   SECONDARY TO CAP  . History of recurrent UTIs   . Interstitial cystitis   . Moderate intellectual disability   . OSA (obstructive sleep apnea)    no machine yet   . Partial epilepsy with  impairment of consciousness (HCC)    FOLLOWED BY DR HICKLING  . Pneumonia 2018 last time  . PONV (postoperative nausea and vomiting)   . Seizure (HCC)    most recent sz " at the end of summer"-last sz years ago per mother  . Urinary incontinence     Patient Active Problem List   Diagnosis Date Noted  . Allergic rhinitis 04/03/2017  . Recurrent falls 03/21/2017  . Weakness 03/21/2017  . Venous insufficiency 03/21/2017  . Obstructive sleep apnea 09/30/2016  . Excessive daytime sleepiness 03/05/2016  . 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 Cough variant asthma vs UACS/ vcd  03/15/2015  . Autism spectrum disorder 02/14/2015  . Nausea with vomiting 02/14/2015  . Obesity, morbid (HCC) 12/13/2014  . 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 Acanthosis nigricans 11/14/2014  . Toeing-in 08/29/2014  . 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 Recurrent urinary tract infection 08/01/2011  . Asthma 08/01/2011  . Chronic constipation 08/01/2011  . Contraception 08/01/2011    Past Surgical History:  Procedure Laterality Date  . CYSTOSCOPY N/A 06/03/2017   Procedure: CYSTOSCOPY WITH EXAM UNDER ANESTHESIA;  Surgeon: Jerilee Field, MD;  Location: [facility];  Service: Urology;  Laterality: N/A;  . CYSTOSCOPY WITH HYDRODISTENSION AND BIOPSY  04/14/2012   Procedure: CYSTOSCOPY/BIOPSY/HYDRODISTENSION;  Surgeon: Lindaann Slough, MD;  Location: [facility];  Service: Urology;;  instillation of marcaine and pyridium  . EUA/  PAP SMEAR/  BREAST EXAM  03-12-2016   dr Erin Fulling [facility]  . EUA/ VAGINOSCOPY/ COLPOSCOPY FO THE HYMENAL RING  10-04-2009    dr Tamela Oddi  [facility]  . TONSILLECTOMY      OB History   None      Home Medications      Prior to Admission medications   Medication Sig Start Date End Date Taking? Authorizing Provider  acetaminophen (TYLENOL) 325 MG tablet Take 2 tablets (650 mg total) by mouth every 6 (six) hours as needed for mild pain or moderate pain. 02/27/17   Everlene Farrier, PA-C  albuterol (PROVENTIL) (2.5 MG/3ML) 0.083% nebulizer solution INHALE CONTENTS OF 1 VIAL IN NEBULIZER EVERY 4 TO 6 HOURS IF NEEDED FOR COUGH OR WHEEZING Patient taking differently: Take 2.5 mg by nebulization every 4 (four) hours as needed for wheezing.  07/07/17   Kozlow, Alvira Philips, MD  azithromycin (ZITHROMAX) 250 MG tablet Take 1 tablet (250 mg total) by mouth daily. Take first 2 tablets together, then 1 every day until finished. 12/04/17   Ward, Layla Maw, DO  benzonatate (TESSALON) 100 MG capsule Take 2 capsules (200 mg total) by mouth 2 (two) times daily as needed for cough. 12/18/17   Sophya Vanblarcom A, NP  budesonide (PULMICORT) 1 MG/2ML nebulizer solution Take 2 mLs (1 mg total) by nebulization 2 (two) times daily. 11/12/17   Kozlow, Alvira Philips, MD  cephALEXin (KEFLEX) 500 MG capsule Take 1 capsule (500 mg total) by mouth 3 (three) times daily. 11/30/17   Loren Racer, MD  cetirizine (ZYRTEC) 10 MG tablet TAKE 1 TABLET BY MOUTH EVERY DAY FOR RUNNY NOSE OR ITCHING. NEED OFFICE VISIT FOR REFILLS Patient taking differently: Take 10 mg by mouth daily.  10/31/17   Kozlow, Alvira Philips, MD  cloNIDine (CATAPRES) 0.1 MG tablet TAKE 1 AND 1/2 TABLET BY MOUTH AT BEDTIME Patient taking differently: Take 0.15 mg by mouth at bedtime.  11/11/17   Elveria Rising, NP  DEPAKOTE 500 MG DR tablet Give 1 tablet in the morning and 2 tablets at night Patient taking differently: Take 500-1,000 mg by mouth See admin instructions. Take 1 tablet in the morning and take 2 tablets at night 09/04/17   Elveria Rising, NP  fluconazole (DIFLUCAN) 100 MG tablet TAKE 1 TABLET BY MOUTH DAILY Patient taking differently: Take 100 mg by mouth daily.  08/21/17   Brock Bad, MD  Levonorgestrel-Ethinyl Estradiol (SEASONIQUE) 0.15-0.03 &0.01 MG tablet Take 1 tablet by mouth daily.    [provider]  Melatonin 3 MG TABS Take 3 mg by mouth at bedtime. For insomnia    [provider]  mometasone (NASONEX) 50 MCG/ACT nasal spray Place 2 sprays into the nose daily. 09/30/17   Kozlow, Alvira Philips, MD  montelukast (SINGULAIR) 10 MG tablet TAKE 1 TABLET BY MOUTH EVERY EVENING TO PREVENT COUGH OR WHEEZING NEEDS AN OFFICE VISIT FOR FURTHER REFILLS Patient taking differently: Take 10 mg by mouth daily.  10/31/17   Kozlow, Alvira Philips, MD  ondansetron (ZOFRAN ODT) 4 MG disintegrating tablet Take 1 tablet (4 mg total) by mouth every 6 (six) hours as needed. 12/04/17   Ward, Layla Maw, DO  PERFOROMIST 20  MCG/2ML nebulizer solution PLACE 2 MLS BY NEBULIZATION 2 TIMES DAILY Patient taking differently: Take 20 mcg by nebulization 2 (two) times daily.  09/01/17   Kozlow, Alvira Philips, MD  polyethylene glycol (MIRALAX / Ethelene Hal) packet Take 17 g by mouth 2 (two) times daily.    [provider]  promethazine (PHENERGAN) 25 MG suppository Place 1 suppository (25 mg total) rectally every 6 (six) hours as needed for nausea or vomiting. 07/10/17   Derwood Kaplan, MD  ranitidine (ZANTAC) 150 MG tablet Take 1 tablet (150 mg total) by mouth 2 (two) times daily. Pharmacy-please d/c rx for 300 mg dosing 11/12/17   Pyrtle, Carie Caddy, MD  senna (SENOKOT) 8.6 MG tablet Take 1 tablet by mouth at bedtime.     [provider]  sulfamethoxazole-trimethoprim (BACTRIM DS,SEPTRA DS) 800-160 MG tablet Take 1 tablet by mouth 2 (two) times daily for 7 days. 12/18/17 12/25/17  Dahlia Byes A, NP  terconazole (TERAZOL 3) 0.8 % vaginal cream Place 1 applicator vaginally daily as needed (yeast).  11/24/17   [provider]  traZODone (DESYREL) 50 MG tablet Take 2 tablets (100 mg total) by mouth at bedtime. 11/11/17   Elveria Rising, NP    Family History Family History  Problem Relation Age  of Onset  . Seizures Mother   . Seizures Sister        1 sister has  . Pancreatic cancer Sister   . Colon cancer Neg Hx   . Colon polyps Neg Hx   . Esophageal cancer Neg Hx   . Rectal cancer Neg Hx   . Stomach cancer Neg Hx     Social History Social History   Tobacco Use  . Smoking status: Never Smoker  . Smokeless tobacco: Never Used  Substance Use Topics  . Alcohol use: No    Alcohol/week: 0.0 standard drinks  . Drug use: No     Allergies   Abilify [aripiprazole]; Seroquel [quetiapine fumarate]; Doxycycline; Amoxicillin-pot clavulanate; and Keppra [levetiracetam]   Review of Systems Review of Systems  Constitutional: Positive for activity change, appetite change and fatigue.  HENT: Positive for congestion and postnasal drip.   Respiratory: Positive for cough and wheezing.   Gastrointestinal: Negative for constipation, diarrhea, nausea and vomiting.     Physical Exam Triage Vital Signs ED Triage Vitals  Enc Vitals Group     BP 12/18/17 1425 120/68     Pulse Rate 12/18/17 1425 (!) 101     Resp 12/18/17 1425 18     Temp 12/18/17 1425 98.3 F (36.8 C)     Temp src --      SpO2 12/18/17 1425 99 %     Weight 12/18/17 1424 185 lb (83.9 kg)     Height --      Head Circumference --      Peak Flow --      Pain Score 12/18/17 1427 10     Pain Loc --      Pain Edu? --      Excl. in GC? --    No data found.  Updated Vital Signs BP 120/68 (BP Location: Right Arm)   Pulse (!) 101   Temp 98.3 F (36.8 C)   Resp 18   Wt 185 lb (83.9 kg)   SpO2 99%   BMI 36.74 kg/m   Visual Acuity Right Eye Distance:   Left Eye Distance:   Bilateral Distance:    Right Eye Near:   Left Eye Near:  Bilateral Near:     Physical Exam  Constitutional: She is oriented to person, place, and time. She appears well-developed and well-nourished.  Very pleasant. Non toxic or ill appearing.     HENT:  Head: Normocephalic and atraumatic.  Bilateral TMs normal.  External  ears normal.  Without posterior oropharyngeal erythema, tonsillar swelling or exudates. No lesions.  Drainage noted in throat No lymphadenopathy.     Eyes: Conjunctivae are normal.  Neck: Normal range of motion.  Cardiovascular: Regular rhythm and normal heart sounds.  Mildly tachycardic  Pulmonary/Chest: Effort normal and breath sounds normal.  Lungs clear in all fields. No dyspnea or distress. No retractions or nasal flaring.     Abdominal: Soft.  Musculoskeletal: Normal range of motion.  Lymphadenopathy:    She has no cervical adenopathy.  Neurological: She is alert and oriented to person, place, and time.  Skin: Skin is warm and dry.  Psychiatric: She has a normal mood and affect.  Nursing note and vitals reviewed.    UC Treatments / Results  Labs (all labs ordered are listed, but only abnormal results are displayed) Labs Reviewed  POCT URINALYSIS DIP (DEVICE) - Abnormal; Notable for the following components:      Result Value   Glucose, UA 100 (*)    Bilirubin Urine SMALL (*)    Ketones, ur 15 (*)    Protein, ur 100 (*)    Urobilinogen, UA 4.0 (*)    All other components within normal limits  CULTURE, GROUP A STREP [facility])  URINE CULTURE  POCT RAPID STREP A    EKG None  Radiology Dg Chest 2 View  Result Date: 12/18/2017 CLINICAL DATA:  Cough and wheezing.  Recent history of pneumonia. EXAM: CHEST - 2 VIEW COMPARISON:  PA and lateral chest 12/04/2017 and 11/06/2016. FINDINGS: Lungs are clear. Heart size is normal. There is no pneumothorax or pleural fluid. No acute or focal bony abnormality. IMPRESSION: Negative chest. Electronically Signed   By: Drusilla Kanner M.D.   On: 12/18/2017 15:08    Procedures Procedures (including critical care time)  Medications Ordered in UC Medications - No data to display  Initial Impression / Assessment and Plan / UC Course  I have reviewed the triage vital signs and the nursing notes.  Pertinent labs & imaging results  that were available during my care of the patient were reviewed by me and considered in my medical decision making (see chart for details).     Checking xray for worsening PNA.  Checking urine for infection Strep swab obtained  Xray negative for PNA Urine negative for infection, will send for culture Strep negative will send for culture.   Mom requesting abx  For UTI today based on presentation, and the way she has been acting, until culture results come back. Will send abx to pharmacy and call with culture results.  Tessalon for cough.  Continue the inhalers and allergy meds.  Benadryl may help symptoms and promote sleeping.  Mom understanding and agreeable to plan    Final Clinical Impressions(s) / UC Diagnoses   Final diagnoses:  Wheezing  Foul smelling urine     Discharge Instructions     Urine was negative for infection- increase fluids and limit sugary drinks.  Chest x ray was negative for pneumonia Strep negative for infection Continue the inhaler and breathing treatments and tessalon for cough.  Ensure you are taking the zyrtec and Singulair.     ED Prescriptions    Medication Sig Dispense  Auth. Provider   sulfamethoxazole-trimethoprim (BACTRIM DS,SEPTRA DS) 800-160 MG tablet Take 1 tablet by mouth 2 (two) times daily for 7 days. 14 tablet Harly Pipkins A, NP   benzonatate (TESSALON) 100 MG capsule Take 2 capsules (200 mg total) by mouth 2 (two) times daily as needed for cough. 20 capsule Dahlia Byes A, NP     Controlled Substance Prescriptions [place] Controlled Substance Registry consulted? Not Applicable   Janace Aris, NP 12/18/17 1606

## 2017-12-19 LAB — URINE CULTURE

## 2017-12-20 LAB — CULTURE, GROUP A STREP (THRC)

## 2017-12-22 ENCOUNTER — Ambulatory Visit (INDEPENDENT_AMBULATORY_CARE_PROVIDER_SITE_OTHER)
Admission: EM | Admit: 2017-12-22 | Discharge: 2017-12-22 | Disposition: A | Discharge status: Home / Self Care | Payer: Medicare Other | Source: Home / Self Care | Attending: Family Medicine | Admitting: Family Medicine

## 2017-12-22 ENCOUNTER — Encounter (HOSPITAL_COMMUNITY): Payer: Self-pay | Admitting: Emergency Medicine

## 2017-12-22 ENCOUNTER — Other Ambulatory Visit: Payer: Self-pay

## 2017-12-22 ENCOUNTER — Emergency Department (HOSPITAL_COMMUNITY)
Admission: EM | Admit: 2017-12-22 | Discharge: 2017-12-22 | Disposition: A | Discharge status: Home / Self Care | Payer: Medicare Other | Attending: Emergency Medicine | Admitting: Emergency Medicine

## 2017-12-22 DIAGNOSIS — J45909 Unspecified asthma, uncomplicated: Secondary | ICD-10-CM | POA: Diagnosis not present

## 2017-12-22 DIAGNOSIS — R1110 Vomiting, unspecified: Secondary | ICD-10-CM | POA: Diagnosis not present

## 2017-12-22 DIAGNOSIS — F840 Autistic disorder: Secondary | ICD-10-CM | POA: Diagnosis not present

## 2017-12-22 DIAGNOSIS — F71 Moderate intellectual disabilities: Secondary | ICD-10-CM | POA: Diagnosis not present

## 2017-12-22 DIAGNOSIS — Z79899 Other long term (current) drug therapy: Secondary | ICD-10-CM | POA: Diagnosis not present

## 2017-12-22 LAB — COMPREHENSIVE METABOLIC PANEL
ALT: 31 U/L (ref 0–44)
AST: 43 U/L — ABNORMAL HIGH (ref 15–41)
Albumin: 2.7 g/dL — ABNORMAL LOW (ref 3.5–5.0)
Alkaline Phosphatase: 56 U/L (ref 38–126)
Anion gap: 8 (ref 5–15)
BUN: 7 mg/dL (ref 6–20)
CHLORIDE: 104 mmol/L (ref 98–111)
CO2: 22 mmol/L (ref 22–32)
Calcium: 8.6 mg/dL — ABNORMAL LOW (ref 8.9–10.3)
Creatinine, Ser: 0.92 mg/dL (ref 0.44–1.00)
GFR calc non Af Amer: >60 mL/min (ref >60)
Glucose, Bld: 125 mg/dL — ABNORMAL HIGH (ref 70–99)
Potassium: 3.8 mmol/L (ref 3.5–5.1)
SODIUM: 134 mmol/L — AB (ref 135–145)
Total Bilirubin: 0.3 mg/dL (ref 0.3–1.2)
Total Protein: 6.8 g/dL (ref 6.5–8.1)

## 2017-12-22 LAB — URINALYSIS, ROUTINE W REFLEX MICROSCOPIC
BILIRUBIN URINE: NEGATIVE
Glucose, UA: NEGATIVE mg/dL
Hgb urine dipstick: NEGATIVE
Ketones, ur: 5 mg/dL — AB
Leukocytes, UA: NEGATIVE
Nitrite: NEGATIVE
Protein, ur: 30 mg/dL — AB
Specific Gravity, Urine: 1.028 (ref 1.005–1.030)
pH: 5 (ref 5.0–8.0)

## 2017-12-22 LAB — CBC
HCT: 33.7 % — ABNORMAL LOW (ref 36.0–46.0)
Hemoglobin: 10.7 g/dL — ABNORMAL LOW (ref 12.0–15.0)
MCH: 29.6 pg (ref 26.0–34.0)
MCHC: 31.8 g/dL (ref 30.0–36.0)
MCV: 93.4 fL (ref 78.0–100.0)
Platelets: 310 10*3/uL (ref 150–400)
RBC: 3.61 MIL/uL — AB (ref 3.87–5.11)
RDW: 14.1 % (ref 11.5–15.5)
WBC: 8.1 10*3/uL (ref 4.0–10.5)

## 2017-12-22 LAB — POCT URINALYSIS DIP (DEVICE)
GLUCOSE, UA: 100 mg/dL — AB
Leukocytes, UA: NEGATIVE
Nitrite: NEGATIVE
PROTEIN: 30 mg/dL — AB
Specific Gravity, Urine: >=1.03 (ref 1.005–1.030)
Urobilinogen, UA: 4 mg/dL — ABNORMAL HIGH (ref 0.0–1.0)
pH: 6 (ref 5.0–8.0)

## 2017-12-22 LAB — I-STAT BETA HCG BLOOD, ED (MC, WL, AP ONLY): I-stat hCG, quantitative: <5 m[IU]/mL (ref <5)

## 2017-12-22 LAB — LIPASE, BLOOD: Lipase: 90 U/L — ABNORMAL HIGH (ref 11–51)

## 2017-12-22 MED ORDER — ONDANSETRON 4 MG PO TBDP
ORAL_TABLET | ORAL | Status: AC
  Filled 2017-12-22: qty 2

## 2017-12-22 MED ORDER — LACTATED RINGERS IV BOLUS
1000.0000 mL | Freq: Once | INTRAVENOUS | Status: AC
  Administered 2017-12-22: 1000 mL via INTRAVENOUS

## 2017-12-22 MED ORDER — ONDANSETRON 4 MG PO TBDP
8.0000 mg | ORAL_TABLET | Freq: Once | ORAL | Status: AC
  Administered 2017-12-22: 8 mg via ORAL

## 2017-12-22 NOTE — ED Triage Notes (Signed)
[name] reports emesis x 1 week. She has been unable to keep anything down. She reports pt has been wetting her pull-ups which is not typical for the patient, reports this happens when she has a UTI. Pt reports abdominal pain. Pt recently taking Bactrim for UTI last week but did not finish course.

## 2017-12-22 NOTE — Discharge Instructions (Signed)
NO evidence of urine infection today Continue to push water and fluids She is a little dehydrated, but does not need to stay in the hospital No chang in usual medicines Follow up with Dr Okey Dupre next week

## 2017-12-22 NOTE — ED Notes (Signed)
Pt put on purewick at 17:45.

## 2017-12-22 NOTE — ED Triage Notes (Signed)
Mother reports vomiting, abdominal pain for a week. PT last vomited this AM

## 2017-12-22 NOTE — ED Notes (Signed)
Pt given Sprite per resident.

## 2017-12-22 NOTE — ED Provider Notes (Signed)
  [facility]       duplicate                  Eustace Moore, MD 12/22/17 1600

## 2017-12-22 NOTE — ED Provider Notes (Signed)
[facility] EMERGENCY DEPARTMENT Provider Note   CSN: [id number] Arrival date & time: 12/22/17  1457     History   Chief Complaint Chief Complaint  Patient presents with  . Emesis  . Urinary Tract Infection    HPI Lori Miles is a 22 y.o. female.  HPI   22 year old female with PMH stable for autism, asthma, recurrent UTIs presents with chief complaint of concern for UTI.  Mother states for the last 1 month patient has had intermittent wheezing after an episode of pneumonia treated with azithromycin.  Patient was seen on 12/18/2017 for concern for persistence of pneumonia found to have negative chest x-ray and concern for UTI given Bactrim on mother's urging after negative urine until culture came back.  Culture came back with multiple organisms concerning for contamination recommending redraw.  Mother stopped Bactrim after 2 days stating Bactrim "does not work" for the patient.  Patient represented today at urgent care for emesis, abdominal pain and concern for UTI.  Patient was able to keep down Sprite after Zofran.  Patient had negative UA dipstick.  Mother took patient to [facility], ED for a second opinion for patient's symptoms.  Mother states the patient has persistence of nausea, abdominal pain secondary to spasms, and wheezing from asthma.  Mother states patient has been receiving her home Zofran with recurrent large volume emesis, nonbloody.  Patient has been receiving recurrent neb laser treatments at home with recurrence of wheezing per mother.  Patient endorses fever, tactile, productive cough, congestion and runny nose for several weeks.  States that patient has endorsed abdominal pain due to patient being nonverbal, unable to specific character quality or location of pain, which began over the last 2 days.  Mother states that patient has had increasing urinary incontinence, now wearing depends, which the patient only has when she has UTI, foul odor to urine,  and dark color of urine.  Patient is unable to contribute to history.   Past Medical History:  Diagnosis Date  . Asthma   . Autism    mom states no actual dx, but admits she has some symptoms  . Bacteriuria   . Chronic constipation   . Cognitive developmental delay   . Disruptive behavior disorder   . Family history of adverse reaction to anesthesia    mother had itching after anesthesia one time  . Frequency-urgency syndrome   . Gait disorder    ORGANIC PER NERUOLGIST NOTE (DR HICKLING)  . 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 GERD (gastroesophageal reflux disease)   . Gout   . H/O sinus tachycardia   . History of acute respiratory failure 02/14/2015   SECONDARY TO CAP  . History of recurrent UTIs   . Interstitial cystitis   . Moderate intellectual disability   . OSA (obstructive sleep apnea)    no machine yet   . Partial epilepsy with impairment of consciousness (HCC)    FOLLOWED BY DR HICKLING  . Pneumonia 2018 last time  . PONV (postoperative nausea and vomiting)   . Seizure (HCC)    most recent sz " at the end of summer"-last sz years ago per mother  . Urinary incontinence     Patient Active Problem List   Diagnosis Date Noted  . Allergic rhinitis 04/03/2017  . Recurrent falls 03/21/2017  . Weakness 03/21/2017  . Venous insufficiency 03/21/2017  . Obstructive sleep apnea 09/30/2016  . Excessive daytime sleepiness 03/05/2016  . 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  Cough variant asthma vs UACS/ vcd  03/15/2015  . Autism spectrum disorder 02/14/2015  . Nausea with vomiting 02/14/2015  . Obesity, morbid (HCC) 12/13/2014  . 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 Acanthosis nigricans 11/14/2014  . Toeing-in 08/29/2014  . 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 Partial epilepsy with impairment of consciousness (HCC)  09/28/2012  . 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 Recurrent urinary tract infection 08/01/2011  . Asthma 08/01/2011  . Chronic constipation 08/01/2011  . Contraception 08/01/2011    Past Surgical History:  Procedure Laterality Date  . CYSTOSCOPY N/A 06/03/2017   Procedure: CYSTOSCOPY WITH EXAM UNDER ANESTHESIA;  Surgeon: Jerilee Field, MD;  Location: [facility];  Service: Urology;  Laterality: N/A;  . CYSTOSCOPY WITH HYDRODISTENSION AND BIOPSY  04/14/2012   Procedure: CYSTOSCOPY/BIOPSY/HYDRODISTENSION;  Surgeon: Lindaann Slough, MD;  Location: [facility];  Service: Urology;;  instillation of marcaine and pyridium  . EUA/  PAP SMEAR/  BREAST EXAM  03-12-2016   dr Erin Fulling [facility]  . EUA/ VAGINOSCOPY/ COLPOSCOPY FO THE HYMENAL RING  10-04-2009    dr Tamela Oddi  [facility]  . TONSILLECTOMY       OB History   None      Home Medications    Prior to Admission medications   Medication Sig Start Date End Date Taking? Authorizing Provider  acetaminophen (TYLENOL) 325 MG tablet Take 2 tablets (650 mg total) by mouth every 6 (six) hours as needed for mild pain or moderate pain. 02/27/17   Everlene Farrier, PA-C  albuterol (PROVENTIL) (2.5 MG/3ML) 0.083% nebulizer solution INHALE CONTENTS OF 1 VIAL IN NEBULIZER EVERY 4 TO 6 HOURS IF NEEDED FOR COUGH OR WHEEZING Patient taking differently: Take 2.5 mg by nebulization every 4 (four) hours as needed for wheezing.  07/07/17   Kozlow, Alvira Philips, MD  benzonatate (TESSALON) 100 MG capsule Take 2 capsules (200 mg total) by mouth 2 (two) times daily as needed for cough. 12/18/17   Bast, Traci A, NP  budesonide (PULMICORT) 1 MG/2ML nebulizer solution Take 2 mLs (1 mg total) by nebulization 2 (two) times daily. 11/12/17   Kozlow, Alvira Philips, MD  cetirizine (ZYRTEC) 10 MG tablet TAKE 1 TABLET BY MOUTH EVERY DAY FOR RUNNY NOSE OR ITCHING. NEED OFFICE VISIT FOR REFILLS Patient taking differently: Take 10 mg by mouth daily.   10/31/17   Kozlow, Alvira Philips, MD  cloNIDine (CATAPRES) 0.1 MG tablet TAKE 1 AND 1/2 TABLET BY MOUTH AT BEDTIME Patient taking differently: Take 0.15 mg by mouth at bedtime.  11/11/17   Elveria Rising, NP  DEPAKOTE 500 MG DR tablet Give 1 tablet in the morning and 2 tablets at night Patient taking differently: Take 500-1,000 mg by mouth See admin instructions. Take 1 tablet in the morning and take 2 tablets at night 09/04/17   Elveria Rising, NP  Levonorgestrel-Ethinyl Estradiol (SEASONIQUE) 0.15-0.03 &0.01 MG tablet Take 1 tablet by mouth daily.    [provider]  Melatonin 3 MG TABS Take 3 mg by mouth at bedtime. For insomnia    [provider]  mometasone (NASONEX) 50 MCG/ACT nasal spray Place 2 sprays into the nose daily. 09/30/17   Kozlow, Alvira Philips, MD  montelukast (SINGULAIR) 10 MG tablet TAKE 1 TABLET BY MOUTH EVERY EVENING TO PREVENT COUGH OR WHEEZING NEEDS AN OFFICE VISIT FOR FURTHER REFILLS Patient taking differently: Take 10 mg by mouth daily.  10/31/17  Kozlow, Alvira Philips, MD  ondansetron (ZOFRAN ODT) 4 MG disintegrating tablet Take 1 tablet (4 mg total) by mouth every 6 (six) hours as needed. 12/04/17   Ward, Layla Maw, DO  PERFOROMIST 20 MCG/2ML nebulizer solution PLACE 2 MLS BY NEBULIZATION 2 TIMES DAILY Patient taking differently: Take 20 mcg by nebulization 2 (two) times daily.  09/01/17   Kozlow, Alvira Philips, MD  polyethylene glycol (MIRALAX / Ethelene Hal) packet Take 17 g by mouth 2 (two) times daily.    [provider]  promethazine (PHENERGAN) 25 MG suppository Place 1 suppository (25 mg total) rectally every 6 (six) hours as needed for nausea or vomiting. 07/10/17   Derwood Kaplan, MD  ranitidine (ZANTAC) 150 MG tablet Take 1 tablet (150 mg total) by mouth 2 (two) times daily. Pharmacy-please d/c rx for 300 mg dosing 11/12/17   Pyrtle, Carie Caddy, MD  senna (SENOKOT) 8.6 MG tablet Take 1 tablet by mouth at bedtime.     [provider]  sulfamethoxazole-trimethoprim  (BACTRIM DS,SEPTRA DS) 800-160 MG tablet Take 1 tablet by mouth 2 (two) times daily for 7 days. 12/18/17 12/25/17  Dahlia Byes A, NP  terconazole (TERAZOL 3) 0.8 % vaginal cream Place 1 applicator vaginally daily as needed (yeast).  11/24/17   [provider]  traZODone (DESYREL) 50 MG tablet Take 2 tablets (100 mg total) by mouth at bedtime. 11/11/17   Elveria Rising, NP    Family History Family History  Problem Relation Age of Onset  . Seizures Mother   . Seizures Sister        1 sister has  . Pancreatic cancer Sister   . Colon cancer Neg Hx   . Colon polyps Neg Hx   . Esophageal cancer Neg Hx   . Rectal cancer Neg Hx   . Stomach cancer Neg Hx     Social History Social History   Tobacco Use  . Smoking status: Never Smoker  . Smokeless tobacco: Never Used  Substance Use Topics  . Alcohol use: No    Alcohol/week: 0.0 standard drinks  . Drug use: No     Allergies   Abilify [aripiprazole]; Seroquel [quetiapine fumarate]; Doxycycline; Amoxicillin-pot clavulanate; and Keppra [levetiracetam]   Review of Systems Review of Systems  Constitutional: Positive for fever. Negative for chills.  HENT: Positive for congestion and rhinorrhea. Negative for ear pain and sore throat.   Eyes: Negative for pain and visual disturbance.  Respiratory: Positive for cough and wheezing. Negative for shortness of breath.   Cardiovascular: Negative for chest pain and palpitations.  Gastrointestinal: Positive for abdominal pain, nausea and vomiting.  Genitourinary: Positive for frequency. Negative for dysuria and hematuria.  Musculoskeletal: Negative for arthralgias and back pain.  Skin: Negative for color change and rash.  Neurological: Negative for seizures and syncope.  All other systems reviewed and are negative.    Physical Exam Updated Vital Signs BP 116/78 (BP Location: Right Arm)   Pulse (!) 103   Temp 98.5 F (36.9 C) (Oral)   Resp (!) 32   SpO2 99%   Physical Exam    Constitutional: She appears well-developed and well-nourished. No distress.  HENT:  Head: Normocephalic and atraumatic.  Mouth/Throat: Mucous membranes are not dry.  Eyes: Conjunctivae are normal.  Neck: Neck supple.  Cardiovascular: Normal rate and regular rhythm.  No murmur heard. Pulmonary/Chest: Effort normal and breath sounds normal. No stridor. No respiratory distress. She has no wheezes. She has no rales.  Abdominal: Soft. Normal appearance and bowel  sounds are normal. There is no tenderness. There is no rigidity, no rebound, no guarding, no tenderness at McBurney's point and negative Murphy's sign.  Musculoskeletal: She exhibits no edema.  Neurological: She is alert. She is not disoriented. GCS eye subscore is 4. GCS verbal subscore is 5. GCS motor subscore is 6.  Skin: Skin is warm and dry.  Psychiatric: She has a normal mood and affect.  Nursing note and vitals reviewed.    ED Treatments / Results  Labs (all labs ordered are listed, but only abnormal results are displayed) Labs Reviewed  LIPASE, BLOOD - Abnormal; Notable for the following components:      Result Value   Lipase 90 (*)    All other components within normal limits  COMPREHENSIVE METABOLIC PANEL - Abnormal; Notable for the following components:   Sodium 134 (*)    Glucose, Bld 125 (*)    Calcium 8.6 (*)    Albumin 2.7 (*)    AST 43 (*)    All other components within normal limits  CBC - Abnormal; Notable for the following components:   RBC 3.61 (*)    Hemoglobin 10.7 (*)    HCT 33.7 (*)    All other components within normal limits  URINALYSIS, ROUTINE W REFLEX MICROSCOPIC - Abnormal; Notable for the following components:   Color, Urine AMBER (*)    APPearance TURBID (*)    Ketones, ur 5 (*)    Protein, ur 30 (*)    Bacteria, UA RARE (*)    All other components within normal limits  I-STAT BETA HCG BLOOD, ED ([facility], [facility], [facility] ONLY)    EKG None  Radiology No results found.  Procedures Procedures  (including critical care time)  Medications Ordered in ED Medications  lactated ringers bolus 1,000 mL (0 mLs Intravenous Stopped 12/22/17 2049)     Initial Impression / Assessment and Plan / ED Course  I have reviewed the triage vital signs and the nursing notes.  Pertinent labs & imaging results that were available during my care of the patient were reviewed by me and considered in my medical decision making (see chart for details).     22 year old female with PMH stable for autism, asthma, recurrent UTIs presents with chief complaint of concern for UTI.  History as above.  DDX includes UTI versus pyelonephritis, asthma exacerbation, pneumonia, bronchitis, viral gastroenteritis.  Patient given 1 L LR in the setting of recent ketones in urine at urgent care and parental insistence.  Physical exam inconsistent with pyelonephritis or pneumonia.  Patient without wheezing or signs of increased work of breathing, doubt asthma exacerbation.  Possible bronchitis.  Labs imaging performed.  Minor elevations in AST or lipase without corresponding tenderness on exam.  Doubt obstructive pathology or pancreatitis.  UA negative for infection.  Patient without episode of emesis while in ED.  Patient stable for discharge home for normal work-up.  Agree with prior provider at urgent care.  Upon informing mother was patient of results, mother states that our urinalysis must be wrong and that our machines must be taking incorrect measurements.  Mother continues to insist that patient is sick despite normal evaluation.  Patient is medically stable for discharge.  Patient seen contact with my attending Dr.Steinl, who agrees with disposition and plan.  Final Clinical Impressions(s) / ED Diagnoses   Final diagnoses:  Non-intractable vomiting, presence of nausea not specified, unspecified vomiting type    ED Discharge Orders    None  Margit Banda, MD 12/22/17 1610    Cathren Laine,  MD 12/23/17 [phone]

## 2017-12-22 NOTE — ED Notes (Signed)
Please note: urine culture also collected in addition to UA and sent down to main lab.

## 2017-12-22 NOTE — ED Notes (Signed)
Pt attempted to use bedside commode for several minutes, but unable to produce any urine. [name] continued to state that her daughter is dehydrated and needs fluids. RN Marijean Niemann aware.

## 2017-12-22 NOTE — ED Provider Notes (Signed)
[facility]    CSN: [id number] Arrival date & time: 12/22/17  1457     History   Chief Complaint Chief Complaint  Patient presents with  . Emesis  . Urinary Tract Infection    HPI Lori Miles is a 22 y.o. female.   HPI  This is a complicated patient with multiple medical problems who is cared for largely by her mother.  She is here today complaining of vomiting.  Complaints the child has abdominal pain.  She complains her child has a bladder infection.  Apparently these are chronic ongoing complaints of many years duration.  The mother believes that the bladder infections trigger her asthma.  She has been giving her albuterol treatments at home.  She vomited twice this morning in spite of having 4 mg of Zofran.  She is not eating or drinking as much.  Mother feels that she is dehydrated.  She feels like she has a bladder infection.  She is currently taking Septra 1 pill twice a day for a visit, same complaints, 12/18/2017.  The culture from that visit she grew multiple organisms with no predominant infecting agent.  I told the mother the child does not have a bladder infection.  She is not at all willing to entertain this opinion.  She also tells me that she has a flare of her asthma and is wheezing.  She does breathe a little bit rapidly, but comes down with reassurance and has no wheezing on examination.  She demonstrated no vomiting while in the office. I gave her 8 mg of Zofran and she drank a Sprite within 30 minutes.  When I went back into the room to discuss with her discharge and treatment, the mother had her bundled up in a winter coat and announce that she was taking her to the emergency room where they could do a "better" urine test. I called the patient's primary care Dr. Hillard Danker.  She tells me that these vomiting, urinary complaints, somatic complaints are largely behavioral.  I feel like the mother is not listening to medical diagnoses and reason..  Dr.  Okey Dupre theorizes that the mother comes here instead of to her office because she does not like Dr. Frutoso Chase opinion.  Past Medical History:  Diagnosis Date  . Asthma   . Autism    mom states no actual dx, but admits she has some symptoms  . Bacteriuria   . Chronic constipation   . Cognitive developmental delay   . Disruptive behavior disorder   . Family history of adverse reaction to anesthesia    mother had itching after anesthesia one time  . Frequency-urgency syndrome   . Gait disorder    ORGANIC PER NERUOLGIST NOTE (DR HICKLING)  . 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 GERD (gastroesophageal reflux disease)   . Gout   . H/O sinus tachycardia   . History of acute respiratory failure 02/14/2015   SECONDARY TO CAP  . History of recurrent UTIs   . Interstitial cystitis   . Moderate intellectual disability   . OSA (obstructive sleep apnea)    no machine yet   . Partial epilepsy with impairment of consciousness (HCC)    FOLLOWED BY DR HICKLING  . Pneumonia 2018 last time  . PONV (postoperative nausea and vomiting)   . Seizure (HCC)    most recent sz " at the end of summer"-last sz years ago per mother  . Urinary incontinence  Patient Active Problem List   Diagnosis Date Noted  . Allergic rhinitis 04/03/2017  . Recurrent falls 03/21/2017  . Weakness 03/21/2017  . Venous insufficiency 03/21/2017  . Obstructive sleep apnea 09/30/2016  . Excessive daytime sleepiness 03/05/2016  . 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 Cough variant asthma vs UACS/ vcd  03/15/2015  . Autism spectrum disorder 02/14/2015  . Nausea with vomiting 02/14/2015  . Obesity, morbid (HCC) 12/13/2014  . 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 Acanthosis nigricans 11/14/2014  . Toeing-in 08/29/2014  . 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 Partial  epilepsy with impairment of consciousness (HCC) 09/28/2012  . 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 Recurrent urinary tract infection 08/01/2011  . Asthma 08/01/2011  . Chronic constipation 08/01/2011  . Contraception 08/01/2011    Past Surgical History:  Procedure Laterality Date  . CYSTOSCOPY N/A 06/03/2017   Procedure: CYSTOSCOPY WITH EXAM UNDER ANESTHESIA;  Surgeon: Jerilee Field, MD;  Location: [facility];  Service: Urology;  Laterality: N/A;  . CYSTOSCOPY WITH HYDRODISTENSION AND BIOPSY  04/14/2012   Procedure: CYSTOSCOPY/BIOPSY/HYDRODISTENSION;  Surgeon: Lindaann Slough, MD;  Location: [facility];  Service: Urology;;  instillation of marcaine and pyridium  . EUA/  PAP SMEAR/  BREAST EXAM  03-12-2016   dr Erin Fulling [facility]  . EUA/ VAGINOSCOPY/ COLPOSCOPY FO THE HYMENAL RING  10-04-2009    dr Tamela Oddi  [facility]  . TONSILLECTOMY      OB History   None      Home Medications    Prior to Admission medications   Medication Sig Start Date End Date Taking? Authorizing Provider  acetaminophen (TYLENOL) 325 MG tablet Take 2 tablets (650 mg total) by mouth every 6 (six) hours as needed for mild pain or moderate pain. 02/27/17   Everlene Farrier, PA-C  albuterol (PROVENTIL) (2.5 MG/3ML) 0.083% nebulizer solution INHALE CONTENTS OF 1 VIAL IN NEBULIZER EVERY 4 TO 6 HOURS IF NEEDED FOR COUGH OR WHEEZING Patient taking differently: Take 2.5 mg by nebulization every 4 (four) hours as needed for wheezing.  07/07/17   Kozlow, Alvira Philips, MD  benzonatate (TESSALON) 100 MG capsule Take 2 capsules (200 mg total) by mouth 2 (two) times daily as needed for cough. 12/18/17   Bast, Traci A, NP  budesonide (PULMICORT) 1 MG/2ML nebulizer solution Take 2 mLs (1 mg total) by nebulization 2 (two) times daily. 11/12/17   Kozlow, Alvira Philips, MD  cetirizine (ZYRTEC) 10 MG tablet TAKE 1 TABLET BY MOUTH EVERY DAY FOR RUNNY NOSE OR ITCHING. NEED OFFICE VISIT FOR REFILLS Patient taking  differently: Take 10 mg by mouth daily.  10/31/17   Kozlow, Alvira Philips, MD  cloNIDine (CATAPRES) 0.1 MG tablet TAKE 1 AND 1/2 TABLET BY MOUTH AT BEDTIME Patient taking differently: Take 0.15 mg by mouth at bedtime.  11/11/17   Elveria Rising, NP  DEPAKOTE 500 MG DR tablet Give 1 tablet in the morning and 2 tablets at night Patient taking differently: Take 500-1,000 mg by mouth See admin instructions. Take 1 tablet in the morning and take 2 tablets at night 09/04/17   Elveria Rising, NP  Levonorgestrel-Ethinyl Estradiol (SEASONIQUE) 0.15-0.03 &0.01 MG tablet Take 1 tablet by mouth daily.    [provider]  Melatonin 3 MG TABS Take 3 mg by mouth at bedtime. For insomnia    [provider]  mometasone (NASONEX) 50 MCG/ACT nasal spray Place 2 sprays  into the nose daily. 09/30/17   Kozlow, Alvira Philips, MD  montelukast (SINGULAIR) 10 MG tablet TAKE 1 TABLET BY MOUTH EVERY EVENING TO PREVENT COUGH OR WHEEZING NEEDS AN OFFICE VISIT FOR FURTHER REFILLS Patient taking differently: Take 10 mg by mouth daily.  10/31/17   Kozlow, Alvira Philips, MD  ondansetron (ZOFRAN ODT) 4 MG disintegrating tablet Take 1 tablet (4 mg total) by mouth every 6 (six) hours as needed. 12/04/17   Ward, Layla Maw, DO  PERFOROMIST 20 MCG/2ML nebulizer solution PLACE 2 MLS BY NEBULIZATION 2 TIMES DAILY Patient taking differently: Take 20 mcg by nebulization 2 (two) times daily.  09/01/17   Kozlow, Alvira Philips, MD  polyethylene glycol (MIRALAX / Ethelene Hal) packet Take 17 g by mouth 2 (two) times daily.    [provider]  promethazine (PHENERGAN) 25 MG suppository Place 1 suppository (25 mg total) rectally every 6 (six) hours as needed for nausea or vomiting. 07/10/17   Derwood Kaplan, MD  ranitidine (ZANTAC) 150 MG tablet Take 1 tablet (150 mg total) by mouth 2 (two) times daily. Pharmacy-please d/c rx for 300 mg dosing 11/12/17   Pyrtle, Carie Caddy, MD  senna (SENOKOT) 8.6 MG tablet Take 1 tablet by mouth at bedtime.     [provider]  sulfamethoxazole-trimethoprim (BACTRIM DS,SEPTRA DS) 800-160 MG tablet Take 1 tablet by mouth 2 (two) times daily for 7 days. 12/18/17 12/25/17  Dahlia Byes A, NP  terconazole (TERAZOL 3) 0.8 % vaginal cream Place 1 applicator vaginally daily as needed (yeast).  11/24/17   [provider]  traZODone (DESYREL) 50 MG tablet Take 2 tablets (100 mg total) by mouth at bedtime. 11/11/17   Elveria Rising, NP    Family History Family History  Problem Relation Age of Onset  . Seizures Mother   . Seizures Sister        1 sister has  . Pancreatic cancer Sister   . Colon cancer Neg Hx   . Colon polyps Neg Hx   . Esophageal cancer Neg Hx   . Rectal cancer Neg Hx   . Stomach cancer Neg Hx     Social History Social History   Tobacco Use  . Smoking status: Never Smoker  . Smokeless tobacco: Never Used  Substance Use Topics  . Alcohol use: No    Alcohol/week: 0.0 standard drinks  . Drug use: No     Allergies   Abilify [aripiprazole]; Seroquel [quetiapine fumarate]; Doxycycline; Amoxicillin-pot clavulanate; and Keppra [levetiracetam]   Review of Systems Review of Systems  Constitutional: Positive for appetite change. Negative for chills and fever.  HENT: Negative for congestion, dental problem, ear pain and sore throat.   Eyes: Negative for pain and visual disturbance.  Respiratory: Positive for shortness of breath and wheezing. Negative for cough.   Cardiovascular: Negative for chest pain and palpitations.  Gastrointestinal: Positive for nausea and vomiting. Negative for abdominal pain.  Genitourinary: Positive for frequency. Negative for dysuria and hematuria.  Musculoskeletal: Negative for arthralgias and back pain.  Skin: Negative for color change and rash.  Neurological: Negative for seizures and syncope.  All other systems reviewed and are negative.    Physical Exam Triage Vital Signs BP 113/76   Pulse (!) 108   Temp 98.5 F (36.9 C) (Oral)    Resp 18   SpO2 97%    Physical Exam  Constitutional: She appears well-developed and well-nourished. No distress.  No distress.  Minimally conversant  HENT:  Head: Normocephalic and atraumatic.  Right Ear: External ear normal.  Left Ear: External ear normal.  Mouth/Throat: Oropharynx is clear and moist.  Eyes: Pupils are equal, round, and reactive to light. Conjunctivae are normal.  Neck: Normal range of motion.  Cardiovascular: Normal rate, regular rhythm and normal heart sounds.  Pulmonary/Chest: Effort normal and breath sounds normal. No respiratory distress. She has no wheezes.  Lungs are clear  Abdominal: Soft. She exhibits no distension. There is no tenderness.  NO tnederness  Musculoskeletal: Normal range of motion. She exhibits no edema.  Lymphadenopathy:    She has no cervical adenopathy.  Neurological: She is alert.  Skin: Skin is warm and dry.     UC Treatments / Results  Labs (all labs ordered are listed, but only abnormal results are displayed) Labs Reviewed  LIPASE, BLOOD - Abnormal; Notable for the following components:      Result Value   Lipase 90 (*)    All other components within normal limits  COMPREHENSIVE METABOLIC PANEL - Abnormal; Notable for the following components:   Sodium 134 (*)    Glucose, Bld 125 (*)    Calcium 8.6 (*)    Albumin 2.7 (*)    AST 43 (*)    All other components within normal limits  CBC - Abnormal; Notable for the following components:   RBC 3.61 (*)    Hemoglobin 10.7 (*)    HCT 33.7 (*)    All other components within normal limits  URINALYSIS, ROUTINE W REFLEX MICROSCOPIC  I-STAT BETA HCG BLOOD, ED ([facility], [facility], [facility] ONLY)    Dip UA: Results for JIOVANNA, FREI (MRN [id number]) as of 12/22/2017 16:19  Ref. Range 12/22/2017 14:06  Bilirubin Urine Latest Ref Range: NEGATIVE  MODERATE (A)  Glucose, UA Latest Ref Range: NEGATIVE mg/dL 409 (A)  Hgb urine dipstick Latest Ref Range: NEGATIVE  TRACE (A)  Ketones, ur Latest Ref  Range: NEGATIVE mg/dL TRACE (A)  Leukocytes, UA Latest Ref Range: NEGATIVE  NEGATIVE  Nitrite Latest Ref Range: NEGATIVE  NEGATIVE  pH Latest Ref Range: 5.0 - 8.0  6.0  Protein Latest Ref Range: NEGATIVE mg/dL 30 (A)  Specific Gravity, Urine Latest Ref Range: 1.005 - 1.030  >=1.030  Urobilinogen, UA Latest Ref Range: 0.0 - 1.0 mg/dL 4.0 (H)    Medications Ordered in UC Zofran 8 mg  Initial Impression / Assessment and Plan / UC Course  I have reviewed the triage vital signs and the nursing notes.  Pertinent labs & imaging results that were available during my care of the patient were reviewed by me and considered in my medical decision making (see chart for details).     The child is completely nontoxic in appearance.  Sitting up and smiling.  Talking in brief sentences.  Drinking her Sprite.  Appears in no discomfort.  Her urinalysis today does not show any evidence of infection.  Mother is advised that she is slightly dehydrated.  Mother is not happy with my opinion and is taking her to the emergency room for care. Final Clinical Impressions(s) / UC Diagnoses    Emesis   ED Prescriptions    None     Controlled Substance Prescriptions [place] Controlled Substance Registry consulted? Not Applicable   Eustace Moore, MD 12/22/17 2016

## 2017-12-23 ENCOUNTER — Ambulatory Visit (INDEPENDENT_AMBULATORY_CARE_PROVIDER_SITE_OTHER): Payer: Medicare Other | Admitting: Family

## 2017-12-23 ENCOUNTER — Encounter: Payer: Self-pay | Admitting: Family

## 2017-12-23 VITALS — BP 122/78 | HR 96 | Temp 98.4°F | Ht 59.5 in

## 2017-12-23 DIAGNOSIS — N390 Urinary tract infection, site not specified: Secondary | ICD-10-CM | POA: Diagnosis not present

## 2017-12-23 MED ORDER — CEFUROXIME AXETIL 500 MG PO TABS: 500.0000 mg | ORAL_TABLET | Freq: Two times a day (BID) | ORAL | 0 refills | Status: DC

## 2017-12-23 NOTE — Progress Notes (Signed)
Lori Miles is a 22 y.o. female with the following history as recorded in EpicCare:  Patient Active Problem List   Diagnosis Date Noted  . Allergic rhinitis 04/03/2017  . Recurrent falls 03/21/2017  . Weakness 03/21/2017  . Venous insufficiency 03/21/2017  . Obstructive sleep apnea 09/30/2016  . Excessive daytime sleepiness 03/05/2016  . 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 Cough variant asthma vs UACS/ vcd  03/15/2015  . Autism spectrum disorder 02/14/2015  . Nausea with vomiting 02/14/2015  . Obesity, morbid (HCC) 12/13/2014  . 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 Acanthosis nigricans 11/14/2014  . Toeing-in 08/29/2014  . 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 Recurrent urinary tract infection 08/01/2011  . Asthma 08/01/2011  . Chronic constipation 08/01/2011  . Contraception 08/01/2011    Current Outpatient Medications  Medication Sig Dispense Refill  . acetaminophen (TYLENOL) 325 MG tablet Take 2 tablets (650 mg total) by mouth every 6 (six) hours as needed for mild pain or moderate pain. 60 tablet 0  . albuterol (PROVENTIL) (2.5 MG/3ML) 0.083% nebulizer solution INHALE CONTENTS OF 1 VIAL IN NEBULIZER EVERY 4 TO 6 HOURS IF NEEDED FOR COUGH OR WHEEZING (Patient taking differently: Take 2.5 mg by nebulization every 4 (four) hours as needed for wheezing. ) 150 mL 5  . benzonatate (TESSALON) 100 MG capsule Take 2 capsules (200 mg total) by mouth 2 (two) times daily as needed for cough. 20 capsule 0  . budesonide (PULMICORT) 1 MG/2ML nebulizer solution Take 2 mLs (1 mg total) by nebulization 2 (two) times daily. 120 mL 1  . cetirizine (ZYRTEC) 10 MG tablet TAKE 1 TABLET BY MOUTH EVERY DAY FOR RUNNY NOSE OR ITCHING. NEED OFFICE VISIT FOR REFILLS  (Patient taking differently: Take 10 mg by mouth daily. ) 30 tablet 3  . cloNIDine (CATAPRES) 0.1 MG tablet TAKE 1 AND 1/2 TABLET BY MOUTH AT BEDTIME (Patient taking differently: Take 0.15 mg by mouth at bedtime. ) 45 tablet 5  . DEPAKOTE 500 MG DR tablet Give 1 tablet in the morning and 2 tablets at night (Patient taking differently: Take 500-1,000 mg by mouth See admin instructions. Take 1 tablet in the morning and take 2 tablets at night) 90 tablet 5  . Levonorgestrel-Ethinyl Estradiol (SEASONIQUE) 0.15-0.03 &0.01 MG tablet Take 1 tablet by mouth daily.    . Melatonin 3 MG TABS Take 3 mg by mouth at bedtime. For insomnia    . mometasone (NASONEX) 50 MCG/ACT nasal spray Place 2 sprays into the nose daily. 17 g 5  . montelukast (SINGULAIR) 10 MG tablet TAKE 1 TABLET BY MOUTH EVERY EVENING TO PREVENT COUGH OR WHEEZING NEEDS AN OFFICE VISIT FOR FURTHER REFILLS (Patient taking differently: Take 10 mg by mouth daily. ) 30 tablet 3  . ondansetron (ZOFRAN ODT) 4 MG disintegrating tablet Take 1 tablet (4 mg total) by mouth every 6 (six) hours as needed. 20 tablet 0  . PERFOROMIST 20 MCG/2ML nebulizer solution PLACE 2 MLS BY NEBULIZATION 2 TIMES DAILY (Patient taking differently: Take 20 mcg by nebulization 2 (two) times daily. ) 120 mL 5  . polyethylene glycol (MIRALAX / GLYCOLAX) packet Take 17 g by mouth 2 (two) times daily.    . promethazine (PHENERGAN) 25 MG suppository Place 1 suppository (25 mg total) rectally every 6 (six) hours as needed  for nausea or vomiting. 12 each 0  . ranitidine (ZANTAC) 150 MG tablet Take 1 tablet (150 mg total) by mouth 2 (two) times daily. Pharmacy-please d/c rx for 300 mg dosing 60 tablet 4  . senna (SENOKOT) 8.6 MG tablet Take 1 tablet by mouth at bedtime.     Marland Kitchen terconazole (TERAZOL 3) 0.8 % vaginal cream Place 1 applicator vaginally daily as needed (yeast).   2  . traZODone (DESYREL) 50 MG tablet Take 2 tablets (100 mg total) by mouth at bedtime. 62 tablet 5  .  cefUROXime (CEFTIN) 500 MG tablet Take 1 tablet (500 mg total) by mouth 2 (two) times daily with a meal. 10 tablet 0   No current facility-administered medications for this visit.     Allergies: Abilify [aripiprazole]; Seroquel [quetiapine fumarate]; Doxycycline; Amoxicillin-pot clavulanate; and Keppra [levetiracetam]  Past Medical History:  Diagnosis Date  . Asthma   . Autism    mom states no actual dx, but admits she has some symptoms  . Bacteriuria   . Chronic constipation   . Cognitive developmental delay   . Disruptive behavior disorder   . Family history of adverse reaction to anesthesia    mother had itching after anesthesia one time  . Frequency-urgency syndrome   . Gait disorder    ORGANIC PER NERUOLGIST NOTE (DR HICKLING)  . 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 GERD (gastroesophageal reflux disease)   . Gout   . H/O sinus tachycardia   . History of acute respiratory failure 02/14/2015   SECONDARY TO CAP  . History of recurrent UTIs   . Interstitial cystitis   . Moderate intellectual disability   . OSA (obstructive sleep apnea)    no machine yet   . Partial epilepsy with impairment of consciousness (HCC)    FOLLOWED BY DR HICKLING  . Pneumonia 2018 last time  . PONV (postoperative nausea and vomiting)   . Seizure (HCC)    most recent sz " at the end of summer"-last sz years ago per mother  . Urinary incontinence     Past Surgical History:  Procedure Laterality Date  . CYSTOSCOPY N/A 06/03/2017   Procedure: CYSTOSCOPY WITH EXAM UNDER ANESTHESIA;  Surgeon: Jerilee Field, MD;  Location: [facility];  Service: Urology;  Laterality: N/A;  . CYSTOSCOPY WITH HYDRODISTENSION AND BIOPSY  04/14/2012   Procedure: CYSTOSCOPY/BIOPSY/HYDRODISTENSION;  Surgeon: Lindaann Slough, MD;  Location: [facility];  Service: Urology;;  instillation of marcaine and pyridium  . EUA/  PAP SMEAR/  BREAST EXAM  03-12-2016   dr  Erin Fulling [facility]  . EUA/ VAGINOSCOPY/ COLPOSCOPY FO THE HYMENAL RING  10-04-2009    dr Tamela Oddi  [facility]  . TONSILLECTOMY      Family History  Problem Relation Age of Onset  . Seizures Mother   . Seizures Sister        1 sister has  . Pancreatic cancer Sister   . Colon cancer Neg Hx   . Colon polyps Neg Hx   . Esophageal cancer Neg Hx   . Rectal cancer Neg Hx   . Stomach cancer Neg Hx     Social History   Tobacco Use  . Smoking status: Never Smoker  . Smokeless tobacco: Never Used  Substance Use Topics  . Alcohol use: No    Alcohol/week: 0.0 standard drinks    Subjective:  Mother brings patient in with complaint of continued wheezing/ vomiting; patient's mother is adamant that her daughter  has a UTI that has been missed by the ER yesterday; in reviewing notes, was seen at [facility] last Thursday and started on Bactrim DS/ urine culture pending; CXR done was normal- showed resolution of recent pneumonia.  Mother notes she stopped Bactrim after 2 days because she didn't think her daughter was doing well on the medication. She took her back to the ER yesterday with complaints of vomiting/ UTI/ wheezing; Mother notes ER gave her daughter 1L of fluid yesterday but would not treat for UTI;    Objective:  Vitals:   12/23/17 1114  BP: 122/78  Pulse: 96  Temp: 98.4 F (36.9 C)  TempSrc: Oral  SpO2: 96%  Height: 4' 11.5" (1.511 m)    General: Well developed, well nourished, in no acute distress; upon initial exam, patient is sleeping/ resting comfortably on exam table; upon waking the patient, she is pleasant and smiling;   Skin : Warm and dry.  Head: Normocephalic and atraumatic  Eyes: Sclera and conjunctiva clear; pupils round and reactive to light; extraocular movements intact  Ears: External normal; canals clear; tympanic membranes normal  Oropharynx: Pink, supple. No suspicious lesions  Neck: Supple without thyromegaly, adenopathy  Lungs: Respirations unlabored; clear to  auscultation bilaterally without wheeze, rales, rhonchi  CVS exam: normal rate and regular rhythm.  Abdomen: Soft; nontender; nondistended; normoactive bowel sounds; no masses or hepatosplenomegaly  Neurologic: Patient is not verbal; provides no history;   Assessment:  1. Recurrent UTI (urinary tract infection)     Plan:  Reassurance given to patient's mother that do not hear wheezing; do not see any concerns for persisting vomiting/ secondary dehydration; Sample cup is sent home-they will bring back sample and will update urine culture; will treat with Ceftin 500 mg bid x 5 days in the interim; mother is comfortable with this treatment plan and notes her daughter has done well on this medication in the past. No acute symptoms noted in office today;   Spent 30 minutes with patient; greater than 50% spent in counseling;     No follow-ups on file.  No orders of the defined types were placed in this encounter.   Requested Prescriptions   Signed Prescriptions Disp Refills  . cefUROXime (CEFTIN) 500 MG tablet 10 tablet 0    Sig: Take 1 tablet (500 mg total) by mouth 2 (two) times daily with a meal.

## 2017-12-24 ENCOUNTER — Other Ambulatory Visit: Payer: Self-pay | Admitting: Family

## 2017-12-24 ENCOUNTER — Other Ambulatory Visit: Payer: Medicare Other

## 2017-12-24 DIAGNOSIS — N390 Urinary tract infection, site not specified: Secondary | ICD-10-CM

## 2017-12-25 ENCOUNTER — Telehealth: Payer: Self-pay

## 2017-12-25 ENCOUNTER — Ambulatory Visit (INDEPENDENT_AMBULATORY_CARE_PROVIDER_SITE_OTHER): Payer: Medicare Other | Admitting: Adult Health

## 2017-12-25 ENCOUNTER — Encounter: Payer: Self-pay | Admitting: Adult Health

## 2017-12-25 DIAGNOSIS — G4719 Other hypersomnia: Secondary | ICD-10-CM

## 2017-12-25 DIAGNOSIS — J45991 Cough variant asthma: Secondary | ICD-10-CM | POA: Diagnosis not present

## 2017-12-25 LAB — URINE CULTURE
MICRO NUMBER:: 91214652
Result:: NO GROWTH
SPECIMEN QUALITY:: ADEQUATE

## 2017-12-25 NOTE — Assessment & Plan Note (Signed)
Appears stable , cont current regimen.  follow up as planned with Dr. Marchelle Gearing

## 2017-12-25 NOTE — Assessment & Plan Note (Signed)
Unclear what is causing patient's sleep disturbances and daytime sleepiness.  Patient does have significant snoring however there is no sleep apnea noted on 3 separate sleep studies that showed sufficient sleep efficacy.  Patient has seen ENT and I recommend that patient be reevaluated by ENT to see if anything possibly is contributing to her ongoing snoring that is correctable.  It is unclear if patient will be able to tolerate a procedure.  Dictated.  Patient's mother would like to wait and call back if she wants to go forward with this.  We will call DME to see if CPAP would be approved for snoring only with a negative sleep study.  I have suggested patient use nasal strips avoid sleeping on her back/supine position. If daytime symptoms persist may need to consider a MSLT to rule out narcolepsy. Recommend a healthy sleep regimen.  Plan  Patient Instructions  Please refer to ENT for evaluation of severe snoring . -call back if you change your mind .  Healthy sleep regimen  Will call DME to see if CPAP will be covered for snoring .  Follow up with Dr. Marchelle Gearing in 6 months for asthma and allergies .  Please contact office for sooner follow up if symptoms do not improve or worsen or seek emergency care

## 2017-12-25 NOTE — Progress Notes (Signed)
@Patient  ID: Lori Miles, female    DOB: 10/08/1995, 22 y.o.   MRN: [id number]  Chief Complaint  Patient presents with  . Follow-up    Referring provider: Myrlene Broker, *  HPI: 22 year old female never smoker that has a complicated medical history that includes cerebral palsy, mental insufficiency with severe behavioral issues and autism.  And seizure disorder she has underlying asthma and allergic rhinitis has been seen by multiple medical providers in our office and is followed by Dr. Lucie Leather with asthma and allergy. Sleep evaluation February 2019 for sleep disturbances suspicious for sleep apnea.  However has had 3 negative sleep studies for sleep apnea, see below . Sleep study positive for primary snoring   TEST /Events :  Sleep study January 06, 2015 at [place] sleep study at [facility] AHI 0.4 (total sleep time 428 minutes) moderate to loud snoring Sleep study March 27, 2016 AHI 0.1 SaO2 low 90%, positive snoring Sleep study December 10, 2017 AHI 0.3, SaO2 low 93% (sleep time 413 minutes) positive snoring  A CPAP titration study was ordered June 2018 with known negative diagnostic sleep study with elimination of snoring on CPAP 10 cm H2O total sleep time 367 minutes with sleep efficiency 98%    12/25/2017 follow-up sleep study results Patient returns for a follow-up visit.  Patient is accompanied by her mother who gives her medical history and answers her medical questions.  Patient has history of seizure disorder, autism mental insufficiency and cerebral palsy.  She has a behavioral disorder.  Patient was seen in February of this year for suspected sleep apnea.  Patient has trouble getting to sleep wakes up choking and cannot breathe in the mornings.  She has trouble staying awake during the daytime and has sleep issues according to her mother.  Is underwent 3 known sleep studies which were performed in October 2016, January 2018 and most  recently September 2019 all 3 of the studies showed no significant sleep apnea with AHI scores from 0.1-0.4.  In June 2018 patient was ordered a CPAP titration study despite a negative diagnostic sleep study this was performed and showed elimination of snoring.  On CPAP 10 cm H2O.  Patient's previous 3 sleep studies did show significant snoring during her exam.  She has been seen by ENT but due to her underlying behavioral issues it is difficult for an exam and was felt that patient might not be able to undergo any sinus surgeries if this was indicated.  Went over all these sleep study results and gave patient education to patient's mother and patient patient's mother is very concerned because she feels that the CPAP would benefit her daughter with her snoring and that is probably the only option as she would not be able to undergo any type of surgery if indicated.  Mother says that she does not think that she would a be able to wear an oral appliance.  She says that patient was able to wear the CPAP machine during the study and it did help her sleep and help with the snoring.    Allergies  Allergen Reactions  . Abilify [Aripiprazole] Swelling and Palpitations  . Seroquel [Quetiapine Fumarate] Palpitations  . Doxycycline Other (See Comments)    Stomach cramps  . Amoxicillin-Pot Clavulanate Hives    Has patient had a PCN reaction causing immediate rash, facial/tongue/throat swelling, SOB or lightheadedness with hypotension: No Has patient had a PCN reaction causing severe rash involving mucus membranes or skin necrosis: No  Has patient had a PCN reaction that required hospitalization: No Has patient had a PCN reaction occurring within the last 10 years: No If all of the above answers are "NO", then may proceed with Cephalosporin use.   Marland Kitchen Keppra [Levetiracetam] Other (See Comments)    Aggressive behavior     Immunization History  Administered Date(s) Administered  . Pneumococcal  Polysaccharide-23 08/06/2016  . Tdap 03/03/2017    Past Medical History:  Diagnosis Date  . Asthma   . Autism    mom states no actual dx, but admits she has some symptoms  . Bacteriuria   . Chronic constipation   . Cognitive developmental delay   . Disruptive behavior disorder   . Family history of adverse reaction to anesthesia    mother had itching after anesthesia one time  . Frequency-urgency syndrome   . Gait disorder    ORGANIC PER NERUOLGIST NOTE (DR HICKLING)  . 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 GERD (gastroesophageal reflux disease)   . Gout   . H/O sinus tachycardia   . History of acute respiratory failure 02/14/2015   SECONDARY TO CAP  . History of recurrent UTIs   . Interstitial cystitis   . Moderate intellectual disability   . OSA (obstructive sleep apnea)    no machine yet   . Partial epilepsy with impairment of consciousness (HCC)    FOLLOWED BY DR HICKLING  . Pneumonia 2018 last time  . PONV (postoperative nausea and vomiting)   . Seizure (HCC)    most recent sz " at the end of summer"-last sz years ago per mother  . Urinary incontinence     Tobacco History: Social History   Tobacco Use  Smoking Status Never Smoker  Smokeless Tobacco Never Used   Counseling given: Not Answered   Outpatient Medications Prior to Visit  Medication Sig Dispense Refill  . acetaminophen (TYLENOL) 325 MG tablet Take 2 tablets (650 mg total) by mouth every 6 (six) hours as needed for mild pain or moderate pain. 60 tablet 0  . albuterol (PROVENTIL) (2.5 MG/3ML) 0.083% nebulizer solution INHALE CONTENTS OF 1 VIAL IN NEBULIZER EVERY 4 TO 6 HOURS IF NEEDED FOR COUGH OR WHEEZING (Patient taking differently: Take 2.5 mg by nebulization every 4 (four) hours as needed for wheezing. ) 150 mL 5  . benzonatate (TESSALON) 100 MG capsule Take 2 capsules (200 mg total) by mouth 2 (two) times daily as needed for cough. 20 capsule 0  . budesonide  (PULMICORT) 1 MG/2ML nebulizer solution Take 2 mLs (1 mg total) by nebulization 2 (two) times daily. 120 mL 1  . cefUROXime (CEFTIN) 500 MG tablet Take 1 tablet (500 mg total) by mouth 2 (two) times daily with a meal. 10 tablet 0  . cetirizine (ZYRTEC) 10 MG tablet TAKE 1 TABLET BY MOUTH EVERY DAY FOR RUNNY NOSE OR ITCHING. NEED OFFICE VISIT FOR REFILLS (Patient taking differently: Take 10 mg by mouth daily. ) 30 tablet 3  . cloNIDine (CATAPRES) 0.1 MG tablet TAKE 1 AND 1/2 TABLET BY MOUTH AT BEDTIME (Patient taking differently: Take 0.15 mg by mouth at bedtime. ) 45 tablet 5  . DEPAKOTE 500 MG DR tablet Give 1 tablet in the morning and 2 tablets at night (Patient taking differently: Take 500-1,000 mg by mouth See admin instructions. Take 1 tablet in the morning and take 2 tablets at night) 90 tablet 5  . Levonorgestrel-Ethinyl Estradiol (SEASONIQUE) 0.15-0.03 &0.01 MG tablet Take 1 tablet by  mouth daily.    . Melatonin 3 MG TABS Take 3 mg by mouth at bedtime. For insomnia    . mometasone (NASONEX) 50 MCG/ACT nasal spray Place 2 sprays into the nose daily. 17 g 5  . montelukast (SINGULAIR) 10 MG tablet TAKE 1 TABLET BY MOUTH EVERY EVENING TO PREVENT COUGH OR WHEEZING NEEDS AN OFFICE VISIT FOR FURTHER REFILLS (Patient taking differently: Take 10 mg by mouth daily. ) 30 tablet 3  . ondansetron (ZOFRAN ODT) 4 MG disintegrating tablet Take 1 tablet (4 mg total) by mouth every 6 (six) hours as needed. 20 tablet 0  . PERFOROMIST 20 MCG/2ML nebulizer solution PLACE 2 MLS BY NEBULIZATION 2 TIMES DAILY (Patient taking differently: Take 20 mcg by nebulization 2 (two) times daily. ) 120 mL 5  . polyethylene glycol (MIRALAX / GLYCOLAX) packet Take 17 g by mouth 2 (two) times daily.    . promethazine (PHENERGAN) 25 MG suppository Place 1 suppository (25 mg total) rectally every 6 (six) hours as needed for nausea or vomiting. 12 each 0  . ranitidine (ZANTAC) 150 MG tablet Take 1 tablet (150 mg total) by mouth 2 (two)  times daily. Pharmacy-please d/c rx for 300 mg dosing 60 tablet 4  . senna (SENOKOT) 8.6 MG tablet Take 1 tablet by mouth at bedtime.     Marland Kitchen terconazole (TERAZOL 3) 0.8 % vaginal cream Place 1 applicator vaginally daily as needed (yeast).   2  . traZODone (DESYREL) 50 MG tablet Take 2 tablets (100 mg total) by mouth at bedtime. 62 tablet 5   No facility-administered medications prior to visit.      Review of Systems  Constitutional:   No  weight loss, night sweats,  Fevers, chills, fatigue, or  lassitude.  HEENT:   No headaches,  Difficulty swallowing,    or  Sore throat,                No sneezing, itching, ear ache,  Poor dentition  +nasal congestion, post nasal drip,   CV:  No chest pain,  Orthopnea, PND, swelling in lower extremities, anasarca, dizziness, palpitations, syncope.   GI  No heartburn, indigestion, abdominal pain, nausea, vomiting, diarrhea, change in bowel habits, loss of appetite, bloody stools.   Resp: No shortness of breath with exertion or at rest.  No excess mucus, no productive cough,  No non-productive cough,  No coughing up of blood.  No change in color of mucus.  No wheezing.  No chest wall deformity  Skin: no rash or lesions.  GU: no dysuria, change in color of urine, no urgency or frequency.  No flank pain, no hematuria   MS:  No joint pain or swelling.  No decreased range of motion.  No back pain.    Physical Exam  BP 106/68 (BP Location: Left Arm, Cuff Size: Normal)   Pulse (!) 111   SpO2 99%   GEN: A/Ox3; pleasant , NAD, obese    HEENT:  [place]/AT,  EACs-clear, TMs-wnl, NOSE-mucosa inflammation  THROAT-clear, no lesions, no postnasal drip or exudate noted. Class 4 MP airway with thickened tongue and narrow post pharynx   NECK:  Supple w/ fair ROM; no JVD; normal carotid impulses w/o bruits; no thyromegaly or nodules palpated; no lymphadenopathy.    RESP  Clear  P & A; w/o, wheezes/ rales/ or rhonchi. no accessory muscle use, no dullness to  percussion  CARD:  RRR, no m/r/g, no peripheral edema, pulses intact, no cyanosis or clubbing.  GI:  Soft & nt; nml bowel sounds; no organomegaly or masses detected.   Musco: Warm bil, no deformities or joint swelling noted.   Neuro: alert, no focal deficits noted.    Skin: Warm, no lesions or rashes    Lab Results:  CBC     Imaging: Dg Chest 2 View  Result Date: 12/18/2017 CLINICAL DATA:  Cough and wheezing.  Recent history of pneumonia. EXAM: CHEST - 2 VIEW COMPARISON:  PA and lateral chest 12/04/2017 and 11/06/2016. FINDINGS: Lungs are clear. Heart size is normal. There is no pneumothorax or pleural fluid. No acute or focal bony abnormality. IMPRESSION: Negative chest. Electronically Signed   By: Drusilla Kanner M.D.   On: 12/18/2017 15:08   Dg Chest 2 View  Result Date: 12/04/2017 CLINICAL DATA:  22 year old female seen multiple times for upper respiratory and urinary tract infections. Patient has had fevers, vomiting and has been incontinent of urine. Hemoptysis. EXAM: CHEST - 2 VIEW COMPARISON:  11/27/2017 FINDINGS: Low lung volumes are redemonstrated with crowding of interstitial lung markings, peribronchial thickening and subtle opacity at the left lung base. Findings are likely related to viral mediated small airway inflammation. Superimposed pneumonia at the left lung base be difficult to entirely exclude. Heart size is normal. No mediastinal widening, adenopathy or aneurysm. No acute osseous abnormality. IMPRESSION: Low lung volumes with increased interstitial lung markings likely representing viral mediated small airway inflammation. Slightly more confluent opacities at the left lung base raise concern for superimposed pneumonia. Electronically Signed   By: Tollie Eth M.D.   On: 12/04/2017 00:46   Dg Chest 2 View  Result Date: 11/27/2017 CLINICAL DATA:  Wheezing. EXAM: CHEST - 2 VIEW COMPARISON:  10/02/2017 FINDINGS: Cardiomediastinal silhouette is normal. Mediastinal  contours appear intact. There is no evidence of focal airspace consolidation, pleural effusion or pneumothorax. Mild peribronchial thickening with central predominance. Osseous structures are without acute abnormality. Soft tissues are grossly normal. IMPRESSION: Mild peribronchial thickening with central predominance consistent with acute bronchitis or airway disease. Electronically Signed   By: Ted Mcalpine M.D.   On: 11/27/2017 14:19     Assessment & Plan:   Excessive daytime sleepiness Unclear what is causing patient's sleep disturbances and daytime sleepiness.  Patient does have significant snoring however there is no sleep apnea noted on 3 separate sleep studies that showed sufficient sleep efficacy.  Patient has seen ENT and I recommend that patient be reevaluated by ENT to see if anything possibly is contributing to her ongoing snoring that is correctable.  It is unclear if patient will be able to tolerate a procedure.  Dictated.  Patient's mother would like to wait and call back if she wants to go forward with this.  We will call DME to see if CPAP would be approved for snoring only with a negative sleep study.  I have suggested patient use nasal strips avoid sleeping on her back/supine position. If daytime symptoms persist may need to consider a MSLT to rule out narcolepsy. Recommend a healthy sleep regimen.  Plan  Patient Instructions  Please refer to ENT for evaluation of severe snoring . -call back if you change your mind .  Healthy sleep regimen  Will call DME to see if CPAP will be covered for snoring .  Follow up with Dr. Marchelle Gearing in 6 months for asthma and allergies .  Please contact office for sooner follow up if symptoms do not improve or worsen or seek emergency care        Cough  variant asthma vs UACS/ vcd  Appears stable , cont current regimen.  follow up as planned with Dr. Tonette Bihari, NP 12/25/2017

## 2017-12-25 NOTE — Patient Instructions (Addendum)
Please refer to ENT for evaluation of severe snoring . -call back if you change your mind .  Healthy sleep regimen  Will call DME to see if CPAP will be covered for snoring .  Follow up with Dr. Marchelle Gearing in 6 months for asthma and allergies .  Please contact office for sooner follow up if symptoms do not improve or worsen or seek emergency care

## 2017-12-25 NOTE — Telephone Encounter (Signed)
FYI

## 2017-12-25 NOTE — Telephone Encounter (Signed)
Copied from CRM [id number]. Topic: General - Other >> Dec 25, 2017 10:51 AM Maia Petties wrote: Reason for CRM: Mother said that pulmonary and ENT agree that pt has something wrong with her nose and the sinus surgery takes 6 months to heal in addition to the 2 weeks initial recovery with packing is too much for the patient. They don't feel she can handle this kind of surgery. Pt is special needs. Pulmonary is going to try some other options such as strips on her nose and to try and get pt a cpap. Please review notes from pulmonary. She wanted to notify Ria Clock and Dr. Okey Dupre.

## 2017-12-29 ENCOUNTER — Telehealth: Payer: Self-pay | Admitting: Internal Medicine

## 2017-12-29 NOTE — Telephone Encounter (Signed)
Copied from CRM [id number]. Topic: Quick Communication - See Telephone Encounter >> Dec 29, 2017  7:29 AM Windy Kalata, NT wrote: Reason for CRM: patient mother is calling and states that patient urine is still strong and is still having spasm. She would like to know can cefUROXime (CEFTIN) 500 MG tablet be refilled. She states she can not come in for another appointment due to transportation. She states she has another cup to bring in a urine sample if they would like for her to do that.   [facility] [phone] - [place], [place] - [address] [place] [address] Phone: [phone] Fax: [phone]

## 2017-12-29 NOTE — Telephone Encounter (Signed)
She cannot have refill and since all urine cultures recently have come up negative for any bacteria I would not recommend treatment. Recommend increase water intake.

## 2017-12-29 NOTE — Telephone Encounter (Signed)
Patients mother informed by carla when giving result notes of recent urine culture from visit with laura

## 2017-12-29 NOTE — Progress Notes (Signed)
Reviewed and agree with assessment/plan.   Captain Blucher, MD [facility] 03/13/2016, 12:24 PM Pager:  [phone]  

## 2018-01-12 ENCOUNTER — Other Ambulatory Visit: Payer: Self-pay | Admitting: Allergy and Immunology

## 2018-01-13 ENCOUNTER — Telehealth: Payer: Self-pay | Admitting: Nurse Practitioner

## 2018-01-13 NOTE — Telephone Encounter (Signed)
I spoke to mother of this pt.  She is asking for a call form you.  Even though pt is seeing Dr. Craige Cotta, [name] NP, for cpap and has been in touch with them, she feels like for pt to get approval with cpap and mask that she needs letter from [name].  I tried to explain from last note that only one MD needed for cpap and supplies and since she saw Dr. Maple Hudson back in 2018 and had SS done with him, to follow up with his office.  (Dr. Craige Cotta now).  Im not sure she got what I was saying, so focused on getting note and pts weight almost 200lbs now and 79f 9.5in.

## 2018-01-13 NOTE — Telephone Encounter (Signed)
Pts mother called stating she wanted to leave a message for NP Aundra Millet, Stating during her OV they had discussed [name]. Wanting to inform Aundra Millet that Lori Miles is "still over weight and struggles with day time sleepiness" Requesting a call to advise

## 2018-01-13 NOTE — Telephone Encounter (Signed)
Please advise that we can't provide a note. I am not sure that I ever saw her daughter as a patient?

## 2018-01-14 NOTE — Telephone Encounter (Signed)
I called mother of pt.  I relayed that per MM/NP, that we cannot provide note for her.  I relayed this to her but she was not able to talk at the time.  She asked to be called back.

## 2018-01-14 NOTE — Telephone Encounter (Signed)
Attempted to call and VM Full. (could not LM).

## 2018-01-15 NOTE — Telephone Encounter (Signed)
I called mother again and relayed that we cannot write letter for her.  She is seeing Dr Craige Cotta now and he will need to write letter.  She states again that they will be in touch with [name] Dr. Vickey Huger in that they need all MD involved to write note.

## 2018-01-19 ENCOUNTER — Encounter: Payer: Self-pay | Admitting: Family

## 2018-01-19 ENCOUNTER — Ambulatory Visit (INDEPENDENT_AMBULATORY_CARE_PROVIDER_SITE_OTHER): Payer: Medicare Other | Admitting: Family

## 2018-01-19 ENCOUNTER — Other Ambulatory Visit (INDEPENDENT_AMBULATORY_CARE_PROVIDER_SITE_OTHER): Payer: Medicare Other

## 2018-01-19 VITALS — BP 112/78 | HR 110 | Temp 97.5°F | Ht 59.5 in | Wt 194.0 lb

## 2018-01-19 DIAGNOSIS — R300 Dysuria: Secondary | ICD-10-CM | POA: Diagnosis not present

## 2018-01-19 DIAGNOSIS — M79671 Pain in right foot: Secondary | ICD-10-CM

## 2018-01-19 DIAGNOSIS — M79672 Pain in left foot: Secondary | ICD-10-CM | POA: Diagnosis not present

## 2018-01-19 LAB — POC URINALSYSI DIPSTICK (AUTOMATED)
Glucose, UA: NEGATIVE
NITRITE UA: NEGATIVE
PH UA: 6 (ref 5.0–8.0)
Protein, UA: POSITIVE — AB
RBC UA: NEGATIVE
UROBILINOGEN UA: 1 U/dL

## 2018-01-19 LAB — URIC ACID: URIC ACID, SERUM: 5.6 mg/dL (ref 2.4–7.0)

## 2018-01-19 MED ORDER — CEFUROXIME AXETIL 500 MG PO TABS: 500.0000 mg | ORAL_TABLET | Freq: Two times a day (BID) | ORAL | 0 refills | Status: DC

## 2018-01-19 NOTE — Addendum Note (Signed)
Addended by: Karma Ganja on: 01/19/2018 11:21 AM   Modules accepted: Orders

## 2018-01-19 NOTE — Progress Notes (Signed)
Lori Miles is a 22 y.o. female with the following history as recorded in EpicCare:  Patient Active Problem List   Diagnosis Date Noted  . Allergic rhinitis 04/03/2017  . Recurrent falls 03/21/2017  . Weakness 03/21/2017  . Venous insufficiency 03/21/2017  . Excessive daytime sleepiness 03/05/2016  . 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 Cough variant asthma vs UACS/ vcd  03/15/2015  . Autism spectrum disorder 02/14/2015  . Nausea with vomiting 02/14/2015  . Obesity, morbid (HCC) 12/13/2014  . 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 Acanthosis nigricans 11/14/2014  . Toeing-in 08/29/2014  . 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 Recurrent urinary tract infection 08/01/2011  . Asthma 08/01/2011  . Chronic constipation 08/01/2011  . Contraception 08/01/2011    Current Outpatient Medications  Medication Sig Dispense Refill  . acetaminophen (TYLENOL) 325 MG tablet Take 2 tablets (650 mg total) by mouth every 6 (six) hours as needed for mild pain or moderate pain. 60 tablet 0  . albuterol (PROVENTIL) (2.5 MG/3ML) 0.083% nebulizer solution INHALE CONTENTS OF 1 VIAL IN NEBULIZER EVERY 4 TO 6 HOURS IF NEEDED FOR COUGH OR WHEEZING (Patient taking differently: Take 2.5 mg by nebulization every 4 (four) hours as needed for wheezing. ) 150 mL 5  . benzonatate (TESSALON) 100 MG capsule Take 2 capsules (200 mg total) by mouth 2 (two) times daily as needed for cough. 20 capsule 0  . budesonide (PULMICORT) 0.5 MG/2ML nebulizer solution TAKE 4 MILLILITERS BY NEBULIZATION TWICE A DAY AS DIRECTED  3  . budesonide (PULMICORT) 1 MG/2ML nebulizer solution Take 2 mLs (1 mg total) by nebulization 2 (two) times daily. 120 mL 1  . cetirizine (ZYRTEC) 10 MG tablet TAKE 1 TABLET  BY MOUTH EVERY DAY FOR RUNNY NOSE OR ITCHING. NEED OFFICE VISIT FOR REFILLS (Patient taking differently: Take 10 mg by mouth daily. ) 30 tablet 3  . cloNIDine (CATAPRES) 0.1 MG tablet TAKE 1 AND 1/2 TABLET BY MOUTH AT BEDTIME (Patient taking differently: Take 0.15 mg by mouth at bedtime. ) 45 tablet 5  . DEPAKOTE 500 MG DR tablet Give 1 tablet in the morning and 2 tablets at night (Patient taking differently: Take 500-1,000 mg by mouth See admin instructions. Take 1 tablet in the morning and take 2 tablets at night) 90 tablet 5  . Levonorgestrel-Ethinyl Estradiol (SEASONIQUE) 0.15-0.03 &0.01 MG tablet Take 1 tablet by mouth daily.    . Melatonin 3 MG TABS Take 3 mg by mouth at bedtime. For insomnia    . mometasone (NASONEX) 50 MCG/ACT nasal spray Place 2 sprays into the nose daily. 17 g 5  . montelukast (SINGULAIR) 10 MG tablet TAKE 1 TABLET BY MOUTH EVERY EVENING TO PREVENT COUGH OR WHEEZING NEEDS AN OFFICE VISIT FOR FURTHER REFILLS (Patient taking differently: Take 10 mg by mouth daily. ) 30 tablet 3  . ondansetron (ZOFRAN ODT) 4 MG disintegrating tablet Take 1 tablet (4 mg total) by mouth every 6 (six) hours as needed. 20 tablet 0  . PERFOROMIST 20 MCG/2ML nebulizer solution PLACE 2 MLS BY NEBULIZATION 2 TIMES DAILY 120 mL 2  . polyethylene glycol (MIRALAX / GLYCOLAX) packet Take 17 g by mouth 2 (two) times daily.    . polyethylene glycol powder (GLYCOLAX/MIRALAX) powder DIS 17 GRAMS IN LIQUID AND DRINK PO BID  4  .  promethazine (PHENERGAN) 25 MG suppository Place 1 suppository (25 mg total) rectally every 6 (six) hours as needed for nausea or vomiting. 12 each 0  . ranitidine (ZANTAC) 150 MG tablet Take 1 tablet (150 mg total) by mouth 2 (two) times daily. Pharmacy-please d/c rx for 300 mg dosing 60 tablet 4  . senna (SENOKOT) 8.6 MG tablet Take 1 tablet by mouth at bedtime.     Marland Kitchen terconazole (TERAZOL 3) 0.8 % vaginal cream Place 1 applicator vaginally daily as needed (yeast).   2  . traZODone  (DESYREL) 50 MG tablet Take 2 tablets (100 mg total) by mouth at bedtime. 62 tablet 5  . cefUROXime (CEFTIN) 500 MG tablet Take 1 tablet (500 mg total) by mouth 2 (two) times daily with a meal. 10 tablet 0   No current facility-administered medications for this visit.     Allergies: Abilify [aripiprazole]; Seroquel [quetiapine fumarate]; Doxycycline; Amoxicillin-pot clavulanate; and Keppra [levetiracetam]  Past Medical History:  Diagnosis Date  . Asthma   . Autism    mom states no actual dx, but admits she has some symptoms  . Bacteriuria   . Chronic constipation   . Cognitive developmental delay   . Disruptive behavior disorder   . Family history of adverse reaction to anesthesia    mother had itching after anesthesia one time  . Frequency-urgency syndrome   . Gait disorder    ORGANIC PER NERUOLGIST NOTE (DR HICKLING)  . 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 GERD (gastroesophageal reflux disease)   . Gout   . H/O sinus tachycardia   . History of acute respiratory failure 02/14/2015   SECONDARY TO CAP  . History of recurrent UTIs   . Interstitial cystitis   . Moderate intellectual disability   . OSA (obstructive sleep apnea)    no machine yet   . Partial epilepsy with impairment of consciousness (HCC)    FOLLOWED BY DR HICKLING  . Pneumonia 2018 last time  . PONV (postoperative nausea and vomiting)   . Seizure (HCC)    most recent sz " at the end of summer"-last sz years ago per mother  . Urinary incontinence     Past Surgical History:  Procedure Laterality Date  . CYSTOSCOPY N/A 06/03/2017   Procedure: CYSTOSCOPY WITH EXAM UNDER ANESTHESIA;  Surgeon: Jerilee Field, MD;  Location: [facility];  Service: Urology;  Laterality: N/A;  . CYSTOSCOPY WITH HYDRODISTENSION AND BIOPSY  04/14/2012   Procedure: CYSTOSCOPY/BIOPSY/HYDRODISTENSION;  Surgeon: Lindaann Slough, MD;  Location: [facility];  Service: Urology;;   instillation of marcaine and pyridium  . EUA/  PAP SMEAR/  BREAST EXAM  03-12-2016   dr Erin Fulling [facility]  . EUA/ VAGINOSCOPY/ COLPOSCOPY FO THE HYMENAL RING  10-04-2009    dr Tamela Oddi  [facility]  . TONSILLECTOMY      Family History  Problem Relation Age of Onset  . Seizures Mother   . Seizures Sister        1 sister has  . Pancreatic cancer Sister   . Colon cancer Neg Hx   . Colon polyps Neg Hx   . Esophageal cancer Neg Hx   . Rectal cancer Neg Hx   . Stomach cancer Neg Hx     Social History   Tobacco Use  . Smoking status: Never Smoker  . Smokeless tobacco: Never Used  Substance Use Topics  . Alcohol use: No    Alcohol/week: 0.0 standard drinks    Subjective:  Presents with her mother today with concerns for UTI; having increased problems with blood in urine, strong-smelling urine; prone to UTIs; urine culture done in early October indicated that previous infection had resolved completely.   Mother is also requesting that patient be checked for gout; notes she was told by a provider in the past that her daughter has gout- wants to make sure she does not need treatment;      Objective:  Vitals:   01/19/18 1037  BP: 112/78  Pulse: (!) 110  Temp: (!) 97.5 F (36.4 C)  TempSrc: Oral  SpO2: 99%  Weight: 194 lb (88 kg)  Height: 4' 11.5" (1.511 m)    General: Well developed, well nourished, in no acute distress  Skin : Warm and dry.  Head: Normocephalic and atraumatic  Lungs: Respirations unlabored; Musculoskeletal: No deformities; no active joint inflammation  Extremities: No edema, cyanosis, clubbing  Vessels: Symmetric bilaterally  Neurologic: Alert and oriented; speech intact; face symmetrical; moves all extremities well; CNII-XII intact without focal deficit   Assessment:  1. Dysuria   2. Pain in both feet     Plan:  1. Update U/A and urine culture today; Rx for Ceftin 500 mg bid which patient has tolerated well in the past; keep planned follow-up with  urology; 2. Update uric acid per mother's request today.   No follow-ups on file.  Orders Placed This Encounter  Procedures  . Urine Culture    Standing Status:   Future    Standing Expiration Date:   01/19/2019  . Uric acid    Standing Status:   Future    Standing Expiration Date:   01/19/2019    Requested Prescriptions   Signed Prescriptions Disp Refills  . cefUROXime (CEFTIN) 500 MG tablet 10 tablet 0    Sig: Take 1 tablet (500 mg total) by mouth 2 (two) times daily with a meal.

## 2018-01-20 LAB — URINE CULTURE
MICRO NUMBER:: 91323970
SPECIMEN QUALITY:: ADEQUATE

## 2018-01-21 ENCOUNTER — Telehealth: Payer: Self-pay | Admitting: Internal Medicine

## 2018-01-21 ENCOUNTER — Telehealth: Payer: Self-pay | Admitting: Adult Health

## 2018-01-21 NOTE — Telephone Encounter (Signed)
Copied from CRM [id number]. Topic: Quick Communication - See Telephone Encounter >> Jan 21, 2018  4:15 PM Arlyss Gandy, NT wrote: CRM for notification. See Telephone encounter for: 01/21/18. Pts mom calling and states right now the medicine that was prescribed on Monday by Ria Clock is working. She reports that her urine now does not have any odor. She states if they have any issues she will call back.

## 2018-01-21 NOTE — Telephone Encounter (Signed)
Called patients mother, unable to reach left message to give [name] a call back.

## 2018-01-22 NOTE — Telephone Encounter (Signed)
[name] is calling back [phone]

## 2018-01-22 NOTE — Telephone Encounter (Signed)
Spoke with and she would like to speak to [place] only. Will route to [facility]

## 2018-01-22 NOTE — Telephone Encounter (Signed)
Called and spoke with patient's mother today regarding HST results She advised the pt is in need of cpap machine Patient would like for VS to call her when returns to office on 01/26/18 Advised pt's mother VS is out of the office until 01/26/18  VS please advise.

## 2018-01-22 NOTE — Telephone Encounter (Signed)
Called pt's mother and was unable to leave message. Will try again later.

## 2018-01-29 ENCOUNTER — Encounter: Payer: Self-pay | Admitting: Certified Nurse Midwife

## 2018-01-29 ENCOUNTER — Ambulatory Visit (INDEPENDENT_AMBULATORY_CARE_PROVIDER_SITE_OTHER): Payer: Medicare Other | Admitting: Certified Nurse Midwife

## 2018-01-29 VITALS — BP 102/71 | HR 112 | Ht <= 58 in | Wt 187.6 lb

## 2018-01-29 DIAGNOSIS — Z87440 Personal history of urinary (tract) infections: Secondary | ICD-10-CM

## 2018-01-29 DIAGNOSIS — Z01419 Encounter for gynecological examination (general) (routine) without abnormal findings: Secondary | ICD-10-CM

## 2018-01-29 DIAGNOSIS — Z3041 Encounter for surveillance of contraceptive pills: Secondary | ICD-10-CM

## 2018-01-29 MED ORDER — LEVONORGEST-ETH ESTRAD 91-DAY 0.15-0.03 &0.01 MG PO TABS: 1.0000 | ORAL_TABLET | Freq: Every day | ORAL | 3 refills | Status: DC

## 2018-01-29 NOTE — Progress Notes (Signed)
GYNECOLOGY ANNUAL PREVENTATIVE CARE ENCOUNTER NOTE  Subjective:   Lori Miles is a 22 y.o. No obstetric history on file. female here for a routine annual gynecologic exam.  Current complaints: none.   Denies abnormal vaginal bleeding, discharge, pelvic pain,  or other gynecologic concerns.    Gynecologic History No LMP recorded. (Menstrual status: Oral contraceptives). Contraception: OCP (estrogen/progesterone) Last Pap: 02/2016. Results were: normal with negative HPV  Obstetric History OB History  No data available    Past Medical History:  Diagnosis Date  . Asthma   . Autism    mom states no actual dx, but admits she has some symptoms  . Bacteriuria   . Chronic constipation   . Cognitive developmental delay   . Disruptive behavior disorder   . Family history of adverse reaction to anesthesia    mother had itching after anesthesia one time  . Frequency-urgency syndrome   . Gait disorder    ORGANIC PER NERUOLGIST NOTE (DR HICKLING)  . 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 GERD (gastroesophageal reflux disease)   . Gout   . H/O sinus tachycardia   . History of acute respiratory failure 02/14/2015   SECONDARY TO CAP  . History of recurrent UTIs   . Interstitial cystitis   . Moderate intellectual disability   . OSA (obstructive sleep apnea)    no machine yet   . Partial epilepsy with impairment of consciousness (HCC)    FOLLOWED BY DR HICKLING  . Pneumonia 2018 last time  . PONV (postoperative nausea and vomiting)   . Seizure (HCC)    most recent sz " at the end of summer"-last sz years ago per mother  . Urinary incontinence     Past Surgical History:  Procedure Laterality Date  . CYSTOSCOPY N/A 06/03/2017   Procedure: CYSTOSCOPY WITH EXAM UNDER ANESTHESIA;  Surgeon: Jerilee Field, MD;  Location: [facility];  Service: Urology;  Laterality: N/A;  . CYSTOSCOPY WITH HYDRODISTENSION AND BIOPSY  04/14/2012   Procedure: CYSTOSCOPY/BIOPSY/HYDRODISTENSION;  Surgeon: Lindaann Slough, MD;  [facility];  Service: Urology;;  instillation of marcaine and pyridium  . EUA/  PAP SMEAR/  BREAST EXAM  03-12-2016   dr Erin Fulling [facility]  . EUA/ VAGINOSCOPY/ COLPOSCOPY FO THE HYMENAL RING  10-04-2009    dr Tamela Oddi  [facility]  . TONSILLECTOMY      Current Outpatient Medications on File Prior to Visit  Medication Sig Dispense Refill  . acetaminophen (TYLENOL) 325 MG tablet Take 2 tablets (650 mg total) by mouth every 6 (six) hours as needed for mild pain or moderate pain. 60 tablet 0  . albuterol (PROVENTIL) (2.5 MG/3ML) 0.083% nebulizer solution INHALE CONTENTS OF 1 VIAL IN NEBULIZER EVERY 4 TO 6 HOURS IF NEEDED FOR COUGH OR WHEEZING (Patient taking differently: Take 2.5 mg by nebulization every 4 (four) hours as needed for wheezing. ) 150 mL 5  . benzonatate (TESSALON) 100 MG capsule Take 2 capsules (200 mg total) by mouth 2 (two) times daily as needed for cough. 20 capsule 0  . budesonide (PULMICORT) 0.5 MG/2ML nebulizer solution TAKE 4 MILLILITERS BY NEBULIZATION TWICE A DAY AS DIRECTED  3  . budesonide (PULMICORT) 1 MG/2ML nebulizer solution Take 2 mLs (1 mg total) by nebulization 2 (two) times daily. 120 mL 1  . cetirizine (ZYRTEC) 10 MG tablet TAKE 1 TABLET BY MOUTH EVERY DAY FOR RUNNY NOSE OR ITCHING. NEED OFFICE VISIT FOR REFILLS (Patient taking differently: Take 10  mg by mouth daily. ) 30 tablet 3  . cloNIDine (CATAPRES) 0.1 MG tablet TAKE 1 AND 1/2 TABLET BY MOUTH AT BEDTIME (Patient taking differently: Take 0.15 mg by mouth at bedtime. ) 45 tablet 5  . DEPAKOTE 500 MG DR tablet Give 1 tablet in the morning and 2 tablets at night (Patient taking differently: Take 500-1,000 mg by mouth See admin instructions. Take 1 tablet in the morning and take 2 tablets at night) 90 tablet 5  . Melatonin 3 MG TABS Take 3 mg by mouth at bedtime. For insomnia    . mometasone (NASONEX) 50 MCG/ACT nasal  spray Place 2 sprays into the nose daily. 17 g 5  . montelukast (SINGULAIR) 10 MG tablet TAKE 1 TABLET BY MOUTH EVERY EVENING TO PREVENT COUGH OR WHEEZING NEEDS AN OFFICE VISIT FOR FURTHER REFILLS (Patient taking differently: Take 10 mg by mouth daily. ) 30 tablet 3  . ondansetron (ZOFRAN ODT) 4 MG disintegrating tablet Take 1 tablet (4 mg total) by mouth every 6 (six) hours as needed. 20 tablet 0  . PERFOROMIST 20 MCG/2ML nebulizer solution PLACE 2 MLS BY NEBULIZATION 2 TIMES DAILY 120 mL 2  . polyethylene glycol (MIRALAX / GLYCOLAX) packet Take 17 g by mouth 2 (two) times daily.    . polyethylene glycol powder (GLYCOLAX/MIRALAX) powder DIS 17 GRAMS IN LIQUID AND DRINK PO BID  4  . promethazine (PHENERGAN) 25 MG suppository Place 1 suppository (25 mg total) rectally every 6 (six) hours as needed for nausea or vomiting. 12 each 0  . ranitidine (ZANTAC) 150 MG tablet Take 1 tablet (150 mg total) by mouth 2 (two) times daily. Pharmacy-please d/c rx for 300 mg dosing 60 tablet 4  . senna (SENOKOT) 8.6 MG tablet Take 1 tablet by mouth at bedtime.     . traZODone (DESYREL) 50 MG tablet Take 2 tablets (100 mg total) by mouth at bedtime. 62 tablet 5   No current facility-administered medications on file prior to visit.     Allergies  Allergen Reactions  . Abilify [Aripiprazole] Swelling and Palpitations  . Seroquel [Quetiapine Fumarate] Palpitations  . Doxycycline Other (See Comments)    Stomach cramps  . Amoxicillin-Pot Clavulanate Hives    Has patient had a PCN reaction causing immediate rash, facial/tongue/throat swelling, SOB or lightheadedness with hypotension: No Has patient had a PCN reaction causing severe rash involving mucus membranes or skin necrosis: No Has patient had a PCN reaction that required hospitalization: No Has patient had a PCN reaction occurring within the last 10 years: No If all of the above answers are "NO", then may proceed with Cephalosporin use.   Marland Kitchen. Keppra  [Levetiracetam] Other (See Comments)    Aggressive behavior     Social History:  reports that she has never smoked. She has never used smokeless tobacco. She reports that she does not drink alcohol or use drugs.  Family History  Problem Relation Age of Onset  . Seizures Mother   . Seizures Sister        1 sister has  . Pancreatic cancer Sister   . Colon cancer Neg Hx   . Colon polyps Neg Hx   . Esophageal cancer Neg Hx   . Rectal cancer Neg Hx   . Stomach cancer Neg Hx     The following portions of the patient's history were reviewed and updated as appropriate: allergies, current medications, past family history, past medical history, past social history, past surgical history and problem list.  Review of Systems Pertinent items noted in HPI and remainder of comprehensive ROS otherwise negative.   Objective:  BP 102/71   Pulse (!) 112   Ht 4\' 9"  (1.448 m)   Wt 187 lb 9.6 oz (85.1 kg)   BMI 40.60 kg/m  CONSTITUTIONAL: female in no acute distress.  HENT:  Normocephalic, atraumatic, External right and left ear normal. Oropharynx is clear and moist EYES: Conjunctivae and EOM are normal. Pupils are equal, round, and reactive to light. SKIN: Skin is warm and dry. No rash noted. Not diaphoretic. No erythema. No pallor. MUSCULOSKELETAL: Normal range of motion. No tenderness.  No cyanosis, clubbing, or edema.  2+ distal pulses. NEUROLOGIC:  Normal reflexes, muscle tone coordination.  PSYCHIATRIC: Normal mood and affect. Normal behavior. Normal judgment and thought content. CARDIOVASCULAR: Normal heart rate noted, regular rhythm RESPIRATORY: Clear to auscultation bilaterally. Effort and breath sounds normal, no problems with respiration noted. ABDOMEN: Soft, normal bowel sounds, no distention noted.  No tenderness, rebound or guarding.   Assessment and Plan:  1. Encounter for annual routine gynecological examination - Normal well women examination  - Next pap smear due 02/2019,  patient to be sedated for pelvic examination   2. History of recurrent UTIs - Hx of recurrent UTIs, patient currently on daily suppression  - POCT urine negative for nitrates  - Urine Culture  3. Encounter for birth control pills maintenance - Rx for yearly refill sent to pharmacy, no complaints with OCP - Levonorgestrel-Ethinyl Estradiol (SEASONIQUE) 0.15-0.03 &0.01 MG tablet; Take 1 tablet by mouth daily.  Dispense: 3 Package; Refill: 3   Routine preventative health maintenance measures emphasized. Please refer to After Visit Summary for other counseling recommendations.    Sharyon Cable, CNM [facility] [employer], [facility]

## 2018-01-29 NOTE — Progress Notes (Signed)
Pt presents for an annual.

## 2018-01-30 ENCOUNTER — Encounter (HOSPITAL_COMMUNITY): Payer: Self-pay

## 2018-01-30 ENCOUNTER — Emergency Department (HOSPITAL_COMMUNITY): Payer: Medicare Other

## 2018-01-30 ENCOUNTER — Other Ambulatory Visit: Payer: Self-pay

## 2018-01-30 ENCOUNTER — Emergency Department (HOSPITAL_COMMUNITY)
Admission: EM | Admit: 2018-01-30 | Discharge: 2018-01-30 | Disposition: A | Discharge status: Home / Self Care | Payer: Medicare Other | Attending: Emergency Medicine | Admitting: Emergency Medicine

## 2018-01-30 DIAGNOSIS — R112 Nausea with vomiting, unspecified: Secondary | ICD-10-CM

## 2018-01-30 DIAGNOSIS — F840 Autistic disorder: Secondary | ICD-10-CM | POA: Insufficient documentation

## 2018-01-30 DIAGNOSIS — Z79899 Other long term (current) drug therapy: Secondary | ICD-10-CM | POA: Insufficient documentation

## 2018-01-30 DIAGNOSIS — F71 Moderate intellectual disabilities: Secondary | ICD-10-CM | POA: Diagnosis not present

## 2018-01-30 DIAGNOSIS — J45909 Unspecified asthma, uncomplicated: Secondary | ICD-10-CM | POA: Insufficient documentation

## 2018-01-30 LAB — CBC WITH DIFFERENTIAL/PLATELET
ABS IMMATURE GRANULOCYTES: 0.14 10*3/uL — AB (ref 0.00–0.07)
BASOS ABS: 0.1 10*3/uL (ref 0.0–0.1)
BASOS PCT: 1 %
Eosinophils Absolute: 0.1 10*3/uL (ref 0.0–0.5)
Eosinophils Relative: 1 %
HCT: 35.7 % — ABNORMAL LOW (ref 36.0–46.0)
HEMOGLOBIN: 11.3 g/dL — AB (ref 12.0–15.0)
IMMATURE GRANULOCYTES: 1 %
LYMPHS PCT: 42 %
Lymphs Abs: 4.6 10*3/uL — ABNORMAL HIGH (ref 0.7–4.0)
MCH: 29.8 pg (ref 26.0–34.0)
MCHC: 31.7 g/dL (ref 30.0–36.0)
MCV: 94.2 fL (ref 80.0–100.0)
Monocytes Absolute: 0.9 10*3/uL (ref 0.1–1.0)
Monocytes Relative: 8 %
NEUTROS ABS: 5.3 10*3/uL (ref 1.7–7.7)
NRBC: 0 % (ref 0.0–0.2)
Neutrophils Relative %: 47 %
PLATELETS: 248 10*3/uL (ref 150–400)
RBC: 3.79 MIL/uL — AB (ref 3.87–5.11)
RDW: 14.3 % (ref 11.5–15.5)
WBC: 11.1 10*3/uL — AB (ref 4.0–10.5)

## 2018-01-30 LAB — COMPREHENSIVE METABOLIC PANEL
ALBUMIN: 2.7 g/dL — AB (ref 3.5–5.0)
ALT: 17 U/L (ref 0–44)
AST: 24 U/L (ref 15–41)
Alkaline Phosphatase: 45 U/L (ref 38–126)
Anion gap: 7 (ref 5–15)
BUN: 6 mg/dL (ref 6–20)
CHLORIDE: 108 mmol/L (ref 98–111)
CO2: 24 mmol/L (ref 22–32)
CREATININE: 0.91 mg/dL (ref 0.44–1.00)
Calcium: 8.4 mg/dL — ABNORMAL LOW (ref 8.9–10.3)
GFR calc non Af Amer: >60 mL/min (ref >60)
GLUCOSE: 133 mg/dL — AB (ref 70–99)
Potassium: 3.8 mmol/L (ref 3.5–5.1)
SODIUM: 139 mmol/L (ref 135–145)
Total Bilirubin: 0.5 mg/dL (ref 0.3–1.2)
Total Protein: 5.7 g/dL — ABNORMAL LOW (ref 6.5–8.1)

## 2018-01-30 LAB — URINALYSIS, ROUTINE W REFLEX MICROSCOPIC
Bilirubin Urine: NEGATIVE
GLUCOSE, UA: NEGATIVE mg/dL
HGB URINE DIPSTICK: NEGATIVE
Ketones, ur: 5 mg/dL — AB
Nitrite: NEGATIVE
PROTEIN: NEGATIVE mg/dL
Specific Gravity, Urine: 1.027 (ref 1.005–1.030)
pH: 5 (ref 5.0–8.0)

## 2018-01-30 LAB — LIPASE, BLOOD: Lipase: 32 U/L (ref 11–51)

## 2018-01-30 LAB — I-STAT BETA HCG BLOOD, ED (MC, WL, AP ONLY): I-stat hCG, quantitative: <5 m[IU]/mL (ref <5)

## 2018-01-30 MED ORDER — SODIUM CHLORIDE 0.9 % IV SOLN
1000.0000 mL | Freq: Once | INTRAVENOUS | Status: AC
  Administered 2018-01-30: 1000 mL via INTRAVENOUS

## 2018-01-30 MED ORDER — ONDANSETRON HCL 4 MG PO TABS: 4.0000 mg | ORAL_TABLET | Freq: Four times a day (QID) | ORAL | 0 refills | Status: DC

## 2018-01-30 MED ORDER — ONDANSETRON HCL 4 MG/2ML IJ SOLN
4.0000 mg | Freq: Once | INTRAMUSCULAR | Status: AC
  Administered 2018-01-30: 4 mg via INTRAVENOUS
  Filled 2018-01-30: qty 2

## 2018-01-30 MED ORDER — CEFUROXIME AXETIL 500 MG PO TABS: 500.0000 mg | ORAL_TABLET | Freq: Two times a day (BID) | ORAL | 0 refills | Status: DC

## 2018-01-30 NOTE — ED Triage Notes (Signed)
Pt brought in with her mother for nausea since yesterday. Pt mother states pt also has had a nose bleed and sore throat x2 days. Pt mother states pt was just treated for UTI x1 week ago.

## 2018-01-30 NOTE — Discharge Instructions (Addendum)
You may continue taking Ceftin for 3 additional days.  Take Zofran as needed for nausea.  Followup with your doctor for further care.

## 2018-01-30 NOTE — ED Notes (Signed)
Patient transported to X-ray 

## 2018-01-30 NOTE — ED Notes (Signed)
Patient verbalizes understanding of discharge instructions. Opportunity for questioning and answers were provided. Armband removed by staff, pt discharged from ED in wheelchair with mother.  

## 2018-01-30 NOTE — ED Provider Notes (Signed)
[facility] Provider Note   CSN: [id number] Arrival date & time: 01/30/18  1656     History   Chief Complaint No chief complaint on file.   HPI Lori Miles is a 22 y.o. female.  The history is provided by a parent and medical records. The history is limited by a developmental delay. No language interpreter was used.     22 year old female with history of mental retardation, recurrent urinary tract infection, disruptive behaviors, partial epilepsy presenting for evaluation of nausea and vomiting.  History obtained through mother who is at bedside.  Per mom, patient was treated with a urinary tract infection with antibiotic and finish it approximately a week ago.  For the past few days, patient has increased strong urine odor, increased urinary frequency, having nausea and vomiting, and having increased wheezing.  No report of fever or chills.  Mom is concerned that patient may have a recurrent urinary tract infection.  She also mention patient often will develop pneumonia after she is sick with UTI.  She has been using her inhaler more frequent.  Her vomitus has trace of blood today which concerns mom.  Past Medical History:  Diagnosis Date  . Asthma   . Autism    mom states no actual dx, but admits she has some symptoms  . Bacteriuria   . Chronic constipation   . Cognitive developmental delay   . Disruptive behavior disorder   . Family history of adverse reaction to anesthesia    mother had itching after anesthesia one time  . Frequency-urgency syndrome   . Gait disorder    ORGANIC PER NERUOLGIST NOTE (DR HICKLING)  . 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 GERD (gastroesophageal reflux disease)   . Gout   . H/O sinus tachycardia   . History of acute respiratory failure 02/14/2015   SECONDARY TO CAP  . History of recurrent UTIs   . Interstitial cystitis   . Moderate intellectual disability   . OSA  (obstructive sleep apnea)    no machine yet   . Partial epilepsy with impairment of consciousness (HCC)    FOLLOWED BY DR HICKLING  . Pneumonia 2018 last time  . PONV (postoperative nausea and vomiting)   . Seizure (HCC)    most recent sz " at the end of summer"-last sz years ago per mother  . Urinary incontinence     Patient Active Problem List   Diagnosis Date Noted  . Allergic rhinitis 04/03/2017  . Recurrent falls 03/21/2017  . Weakness 03/21/2017  . Venous insufficiency 03/21/2017  . Excessive daytime sleepiness 03/05/2016  . 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 Cough variant asthma vs UACS/ vcd  03/15/2015  . Autism spectrum disorder 02/14/2015  . Nausea with vomiting 02/14/2015  . Obesity, morbid (HCC) 12/13/2014  . 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 Acanthosis nigricans 11/14/2014  . Toeing-in 08/29/2014  . 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 Recurrent urinary tract infection 08/01/2011  . Asthma 08/01/2011  . Chronic constipation 08/01/2011  . Contraception 08/01/2011    Past Surgical History:  Procedure Laterality Date  . CYSTOSCOPY N/A 06/03/2017   Procedure: CYSTOSCOPY WITH EXAM UNDER ANESTHESIA;  Surgeon: Jerilee FieldEskridge, [name], MD;  [facility];  Service: Urology;  Laterality: N/A;  .  CYSTOSCOPY WITH HYDRODISTENSION AND BIOPSY  04/14/2012   Procedure: CYSTOSCOPY/BIOPSY/HYDRODISTENSION;  Surgeon: Lindaann Slough, MD;  Location: [facility] [place];  Service: Urology;;  instillation of marcaine and pyridium  . EUA/  PAP SMEAR/  BREAST EXAM  03-12-2016   dr Erin Fulling [facility]  . EUA/ VAGINOSCOPY/ COLPOSCOPY FO THE HYMENAL RING  10-04-2009    dr Tamela Oddi  [facility]  . TONSILLECTOMY       OB History     None      Home Medications    Prior to Admission medications   Medication Sig Start Date End Date Taking? Authorizing Provider  acetaminophen (TYLENOL) 325 MG tablet Take 2 tablets (650 mg total) by mouth every 6 (six) hours as needed for mild pain or moderate pain. 02/27/17   Everlene Farrier, PA-C  albuterol (PROVENTIL) (2.5 MG/3ML) 0.083% nebulizer solution INHALE CONTENTS OF 1 VIAL IN NEBULIZER EVERY 4 TO 6 HOURS IF NEEDED FOR COUGH OR WHEEZING Patient taking differently: Take 2.5 mg by nebulization every 4 (four) hours as needed for wheezing.  07/07/17   Kozlow, Alvira Philips, MD  benzonatate (TESSALON) 100 MG capsule Take 2 capsules (200 mg total) by mouth 2 (two) times daily as needed for cough. 12/18/17   Bast, Traci A, NP  budesonide (PULMICORT) 0.5 MG/2ML nebulizer solution TAKE 4 MILLILITERS BY NEBULIZATION TWICE A DAY AS DIRECTED 01/11/18   [provider]  budesonide (PULMICORT) 1 MG/2ML nebulizer solution Take 2 mLs (1 mg total) by nebulization 2 (two) times daily. 11/12/17   Kozlow, Alvira Philips, MD  cetirizine (ZYRTEC) 10 MG tablet TAKE 1 TABLET BY MOUTH EVERY DAY FOR RUNNY NOSE OR ITCHING. NEED OFFICE VISIT FOR REFILLS Patient taking differently: Take 10 mg by mouth daily.  10/31/17   Kozlow, Alvira Philips, MD  cloNIDine (CATAPRES) 0.1 MG tablet TAKE 1 AND 1/2 TABLET BY MOUTH AT BEDTIME Patient taking differently: Take 0.15 mg by mouth at bedtime.  11/11/17   Elveria Rising, NP  DEPAKOTE 500 MG DR tablet Give 1 tablet in the morning and 2 tablets at night Patient taking differently: Take 500-1,000 mg by mouth See admin instructions. Take 1 tablet in the morning and take 2 tablets at night 09/04/17   Elveria Rising, NP  Levonorgestrel-Ethinyl Estradiol (SEASONIQUE) 0.15-0.03 &0.01 MG tablet Take 1 tablet by mouth daily. 01/29/18   Sharyon Cable, CNM  Melatonin 3 MG TABS Take 3 mg by mouth at bedtime. For insomnia    [provider]  mometasone (NASONEX) 50 MCG/ACT nasal  spray Place 2 sprays into the nose daily. 09/30/17   Kozlow, Alvira Philips, MD  montelukast (SINGULAIR) 10 MG tablet TAKE 1 TABLET BY MOUTH EVERY EVENING TO PREVENT COUGH OR WHEEZING NEEDS AN OFFICE VISIT FOR FURTHER REFILLS Patient taking differently: Take 10 mg by mouth daily.  10/31/17   Kozlow, Alvira Philips, MD  ondansetron (ZOFRAN ODT) 4 MG disintegrating tablet Take 1 tablet (4 mg total) by mouth every 6 (six) hours as needed. 12/04/17   Ward, Layla Maw, DO  PERFOROMIST 20 MCG/2ML nebulizer solution PLACE 2 MLS BY NEBULIZATION 2 TIMES DAILY 01/12/18   Kozlow, Alvira Philips, MD  polyethylene glycol (MIRALAX / GLYCOLAX) packet Take 17 g by mouth 2 (two) times daily.    [provider]  polyethylene glycol powder (GLYCOLAX/MIRALAX) powder DIS 17 GRAMS IN LIQUID AND DRINK PO BID 01/14/18   [provider]  promethazine (PHENERGAN) 25 MG suppository Place 1 suppository (25 mg total) rectally every 6 (six) hours as  needed for nausea or vomiting. 07/10/17   Derwood Kaplan, MD  ranitidine (ZANTAC) 150 MG tablet Take 1 tablet (150 mg total) by mouth 2 (two) times daily. Pharmacy-please d/c rx for 300 mg dosing 11/12/17   Pyrtle, Carie Caddy, MD  senna (SENOKOT) 8.6 MG tablet Take 1 tablet by mouth at bedtime.     [provider]  traZODone (DESYREL) 50 MG tablet Take 2 tablets (100 mg total) by mouth at bedtime. 11/11/17   Elveria Rising, NP    Family History Family History  Problem Relation Age of Onset  . Seizures Mother   . Seizures Sister        1 sister has  . Pancreatic cancer Sister   . Colon cancer Neg Hx   . Colon polyps Neg Hx   . Esophageal cancer Neg Hx   . Rectal cancer Neg Hx   . Stomach cancer Neg Hx     Social History Social History   Tobacco Use  . Smoking status: Never Smoker  . Smokeless tobacco: Never Used  Substance Use Topics  . Alcohol use: No    Alcohol/week: 0.0 standard drinks  . Drug use: No     Allergies   Abilify [aripiprazole]; Seroquel [quetiapine  fumarate]; Doxycycline; Amoxicillin-pot clavulanate; and Keppra [levetiracetam]   Review of Systems Review of Systems  All other systems reviewed and are negative.    Physical Exam Updated Vital Signs Ht 4\' 9"  (1.448 m)   Wt 85 kg   BMI 40.55 kg/m   Physical Exam  Constitutional: She appears well-developed and well-nourished. No distress.  Patient resting bed comfortably in no acute discomfort.  She is smiling  HENT:  Head: Atraumatic.  Mouth/Throat: Oropharynx is clear and moist.  Eyes: Conjunctivae are normal.  Neck: Neck supple.  Cardiovascular: Normal rate and regular rhythm.  Pulmonary/Chest: Effort normal and breath sounds normal. She has no wheezes. She has no rales.  Abdominal: Soft. Bowel sounds are normal. She exhibits no distension. There is no tenderness.  Lymphadenopathy:    She has no cervical adenopathy.  Neurological: She is alert.  Skin: No rash noted.  Psychiatric: She has a normal mood and affect.  Nursing note and vitals reviewed.    ED Treatments / Results  Labs (all labs ordered are listed, but only abnormal results are displayed) Labs Reviewed  CBC WITH DIFFERENTIAL/PLATELET - Abnormal; Notable for the following components:      Result Value   WBC 11.1 (*)    RBC 3.79 (*)    Hemoglobin 11.3 (*)    HCT 35.7 (*)    Lymphs Abs 4.6 (*)    Abs Immature Granulocytes 0.14 (*)    All other components within normal limits  COMPREHENSIVE METABOLIC PANEL - Abnormal; Notable for the following components:   Glucose, Bld 133 (*)    Calcium 8.4 (*)    Total Protein 5.7 (*)    Albumin 2.7 (*)    All other components within normal limits  URINALYSIS, ROUTINE W REFLEX MICROSCOPIC - Abnormal; Notable for the following components:   Ketones, ur 5 (*)    Leukocytes, UA TRACE (*)    Bacteria, UA RARE (*)    All other components within normal limits  LIPASE, BLOOD  I-STAT BETA HCG BLOOD, ED ([facility], [facility], [facility] ONLY)    EKG None  Radiology Dg Chest 2  View  Result Date: 01/30/2018 CLINICAL DATA:  Two-day history of nausea and sore throat. EXAM: CHEST - 2 VIEW COMPARISON:  12/18/2017 FINDINGS: Low lung volumes without focal consolidation, edema, or pleural effusion. The cardiopericardial silhouette is within normal limits for size. The visualized bony structures of the thorax are intact. IMPRESSION: No active cardiopulmonary disease. Electronically Signed   By: Kennith Center M.D.   On: 01/30/2018 18:29    Procedures Procedures (including critical care time)  Medications Ordered in ED Medications  0.9 %  sodium chloride infusion (1,000 mLs Intravenous New Bag/Given 01/30/18 1751)  ondansetron (ZOFRAN) injection 4 mg (4 mg Intravenous Given 01/30/18 1752)     Initial Impression / Assessment and Plan / ED Course  I have reviewed the triage vital signs and the nursing notes.  Pertinent labs & imaging results that were available during my care of the patient were reviewed by me and considered in my medical decision making (see chart for details).     Ht 4\' 9"  (1.448 m)   Wt 85 kg   BMI 40.55 kg/m    Final Clinical Impressions(s) / ED Diagnoses   Final diagnoses:  Non-intractable vomiting with nausea, unspecified vomiting type    ED Discharge Orders         Ordered    cefUROXime (CEFTIN) 500 MG tablet  2 times daily with meals     01/30/18 2000    ondansetron (ZOFRAN) 4 MG tablet  Every 6 hours     01/30/18 2000         5:22 PM Patient here due to nausea and vomiting and mom voiced concern for worsening urinary tract infection as she was recently treated for UTI.  She also having coughing and wheezing.  She does not appears to be in any acute respiratory discomfort, no wheezing on exam.  Abdomen is soft nontender, patient is well-appearing.  She does have history of autism and history was obtained primarily through mother.  Work-up initiated.  IV fluid and antinausea medication ordered.  7:58 PM Labs are mostly  reassuring, mildly elevated WBC of 11.1, pregnancy test is negative, electro lites panels are reassuring, UA without signs of urinary tract infection, chest x-ray unremarkable.  Patient was treated with Ceftin for sinus infection and UTI recently for 5 days.  Mom felt a slightly longer course can be helpful for her sinus complaint.  I will prescribe an additional 3-day course of Ceftin and encouraged patient to follow-up with PCP for further care.   Fayrene Helper, PA-C 01/30/18 2217    Sabas Sous, MD 01/30/18 [phone]

## 2018-02-02 LAB — URINE CULTURE

## 2018-02-02 NOTE — Telephone Encounter (Signed)
Kelli and Dr. Craige CottaSood, please advise if you have an update for us. Thanks!

## 2018-02-02 NOTE — Telephone Encounter (Signed)
Tried to call pt's mother back, VM is full at this time and cannot leave a message. Will try again tomorrow VS will be back in office 02/03/18

## 2018-02-03 NOTE — Telephone Encounter (Signed)
Tried to call pt's mother back, VM is full at this time and cannot leave a message. Will try again tomorrow VS will be back in office 02/04/18

## 2018-02-04 ENCOUNTER — Other Ambulatory Visit: Payer: Medicare Other

## 2018-02-04 ENCOUNTER — Encounter: Payer: Self-pay | Admitting: Family

## 2018-02-04 ENCOUNTER — Ambulatory Visit (INDEPENDENT_AMBULATORY_CARE_PROVIDER_SITE_OTHER): Payer: Medicare Other | Admitting: Family

## 2018-02-04 VITALS — BP 106/78 | HR 106 | Temp 97.7°F | Ht <= 58 in | Wt 188.1 lb

## 2018-02-04 DIAGNOSIS — R300 Dysuria: Secondary | ICD-10-CM | POA: Diagnosis not present

## 2018-02-04 LAB — POC URINALSYSI DIPSTICK (AUTOMATED)
BILIRUBIN UA: 1
Blood, UA: NEGATIVE
Glucose, UA: NEGATIVE
KETONES UA: 1
LEUKOCYTES UA: NEGATIVE
NITRITE UA: NEGATIVE
PH UA: 6 (ref 5.0–8.0)
PROTEIN UA: POSITIVE — AB
Spec Grav, UA: >=1.03 — AB (ref 1.010–1.025)
Urobilinogen, UA: 1 E.U./dL

## 2018-02-04 MED ORDER — CEFUROXIME AXETIL 500 MG PO TABS: 500.0000 mg | ORAL_TABLET | Freq: Two times a day (BID) | ORAL | 0 refills | Status: DC

## 2018-02-04 NOTE — Patient Instructions (Signed)
You need to schedule a follow-up with her urologist; she is having too many infections.

## 2018-02-04 NOTE — Progress Notes (Signed)
Lori Miles is a 22 y.o. female with the following history as recorded in Epic[employer]:  Patient Active Problem List   Diagnosis Date Noted  . Allergic rhinitis 04/03/2017  . Recurrent falls 03/21/2017  . Weakness 03/21/2017  . Venous insufficiency 03/21/2017  . Excessive daytime sleepiness 03/05/2016  . 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 Cough variant asthma vs UACS/ vcd  03/15/2015  . Autism spectrum disorder 02/14/2015  . Nausea with vomiting 02/14/2015  . Obesity, morbid (HCC) 12/13/2014  . 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 Acanthosis nigricans 11/14/2014  . Toeing-in 08/29/2014  . 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 Recurrent urinary tract infection 08/01/2011  . Asthma 08/01/2011  . Chronic constipation 08/01/2011  . Contraception 08/01/2011    Current Outpatient Medications  Medication Sig Dispense Refill  . acetaminophen (TYLENOL) 325 MG tablet Take 2 tablets (650 mg total) by mouth every 6 (six) hours as needed for mild pain or moderate pain. 60 tablet 0  . albuterol (PROVENTIL) (2.5 MG/3ML) 0.083% nebulizer solution INHALE CONTENTS OF 1 VIAL IN NEBULIZER EVERY 4 TO 6 HOURS IF NEEDED FOR COUGH OR WHEEZING (Patient taking differently: Take 2.5 mg by nebulization every 4 (four) hours as needed for wheezing. ) 150 mL 5  . benzonatate (TESSALON) 100 MG capsule Take 2 capsules (200 mg total) by mouth 2 (two) times daily as needed for cough. 20 capsule 0  . budesonide (PULMICORT) 0.5 MG/2ML nebulizer solution TAKE 4 MILLILITERS BY NEBULIZATION TWICE A DAY AS DIRECTED  3  . budesonide (PULMICORT) 1 MG/2ML nebulizer solution Take 2 mLs (1 mg total) by nebulization 2 (two) times daily. 120 mL 1  . cefUROXime (CEFTIN) 500 MG tablet Take 1  tablet (500 mg total) by mouth 2 (two) times daily with a meal. 6 tablet 0  . cetirizine (ZYRTEC) 10 MG tablet TAKE 1 TABLET BY MOUTH EVERY DAY FOR RUNNY NOSE OR ITCHING. NEED OFFICE VISIT FOR REFILLS (Patient taking differently: Take 10 mg by mouth daily. ) 30 tablet 3  . cloNIDine (CATAPRES) 0.1 MG tablet TAKE 1 AND 1/2 TABLET BY MOUTH AT BEDTIME (Patient taking differently: Take 0.15 mg by mouth at bedtime. ) 45 tablet 5  . DEPAKOTE 500 MG DR tablet Give 1 tablet in the morning and 2 tablets at night (Patient taking differently: Take 500-1,000 mg by mouth See admin instructions. Take 1 tablet in the morning and take 2 tablets at night) 90 tablet 5  . Levonorgestrel-Ethinyl Estradiol (SEASONIQUE) 0.15-0.03 &0.01 MG tablet Take 1 tablet by mouth daily. 3 Package 3  . Melatonin 3 MG TABS Take 3 mg by mouth at bedtime. For insomnia    . mometasone (NASONEX) 50 MCG/ACT nasal spray Place 2 sprays into the nose daily. 17 g 5  . montelukast (SINGULAIR) 10 MG tablet TAKE 1 TABLET BY MOUTH EVERY EVENING TO PREVENT COUGH OR WHEEZING NEEDS AN OFFICE VISIT FOR FURTHER REFILLS (Patient taking differently: Take 10 mg by mouth daily. ) 30 tablet 3  . ondansetron (ZOFRAN ODT) 4 MG disintegrating tablet Take 1 tablet (4 mg total) by mouth every 6 (six) hours as needed. 20 tablet 0  . ondansetron (ZOFRAN) 4 MG tablet Take 1 tablet (4 mg total) by mouth every 6 (six) hours. 12 tablet 0  . PERFOROMIST 20 MCG/2ML nebulizer solution PLACE  2 MLS BY NEBULIZATION 2 TIMES DAILY 120 mL 2  . polyethylene glycol (MIRALAX / GLYCOLAX) packet Take 17 g by mouth 2 (two) times daily.    . polyethylene glycol powder (GLYCOLAX/MIRALAX) powder DIS 17 GRAMS IN LIQUID AND DRINK PO BID  4  . promethazine (PHENERGAN) 25 MG suppository Place 1 suppository (25 mg total) rectally every 6 (six) hours as needed for nausea or vomiting. 12 each 0  . ranitidine (ZANTAC) 150 MG tablet Take 1 tablet (150 mg total) by mouth 2 (two) times daily.  Pharmacy-please d/c rx for 300 mg dosing 60 tablet 4  . senna (SENOKOT) 8.6 MG tablet Take 1 tablet by mouth at bedtime.     . traZODone (DESYREL) 50 MG tablet Take 2 tablets (100 mg total) by mouth at bedtime. 62 tablet 5   No current facility-administered medications for this visit.     Allergies: Abilify [aripiprazole]; Seroquel [quetiapine fumarate]; Doxycycline; Amoxicillin-pot clavulanate; and Keppra [levetiracetam]  Past Medical History:  Diagnosis Date  . Asthma   . Autism    mom states no actual dx, but admits she has some symptoms  . Bacteriuria   . Chronic constipation   . Cognitive developmental delay   . Disruptive behavior disorder   . Family history of adverse reaction to anesthesia    mother had itching after anesthesia one time  . Frequency-urgency syndrome   . Gait disorder    ORGANIC PER NERUOLGIST NOTE (DR HICKLING)  . 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 GERD (gastroesophageal reflux disease)   . Gout   . H/O sinus tachycardia   . History of acute respiratory failure 02/14/2015   SECONDARY TO CAP  . History of recurrent UTIs   . Interstitial cystitis   . Moderate intellectual disability   . OSA (obstructive sleep apnea)    no machine yet   . Partial epilepsy with impairment of consciousness (HCC)    FOLLOWED BY DR HICKLING  . Pneumonia 2018 last time  . PONV (postoperative nausea and vomiting)   . Seizure (HCC)    most recent sz " at the end of summer"-last sz years ago per mother  . Urinary incontinence     Past Surgical History:  Procedure Laterality Date  . CYSTOSCOPY N/A 06/03/2017   Procedure: CYSTOSCOPY WITH EXAM UNDER ANESTHESIA;  Surgeon: Jerilee Field, MD;  [facility]: [facility];  Service: Urology;  Laterality: N/A;  . CYSTOSCOPY WITH HYDRODISTENSION AND BIOPSY  04/14/2012   Procedure: CYSTOSCOPY/BIOPSY/HYDRODISTENSION;  Surgeon: Lindaann Slough, MD;  [facility]  [facility];   Service: Urology;;  instillation of marcaine and pyridium  . EUA/  PAP SMEAR/  BREAST EXAM  03-12-2016   dr Erin Fulling [facility]  . EUA/ VAGINOSCOPY/ COLPOSCOPY FO THE HYMENAL RING  10-04-2009    dr Tamela Oddi  [facility]  . TONSILLECTOMY      Family History  Problem Relation Age of Onset  . Seizures Mother   . Seizures Sister        1 sister has  . Pancreatic cancer Sister   . Colon cancer Neg Hx   . Colon polyps Neg Hx   . Esophageal cancer Neg Hx   . Rectal cancer Neg Hx   . Stomach cancer Neg Hx     Social History   Tobacco Use  . Smoking status: Never Smoker  . Smokeless tobacco: Never Used  Substance Use Topics  . Alcohol use: No    Alcohol/week: 0.0 standard drinks  Subjective:  Patient presents with her mother with concerns for another UTI; was treated in early November with Ceftin- that infection did seem to clear; went to GYN last Wednesday- urine culture done there did show UTI; went back to ER on 11/15 with concerns for vomiting; was given 3 days of Ceftin from the ER; known history of interstitial cystitis; mother feels that eating oranges is setting off the recurrent UTIs;   Objective:  Vitals:   02/04/18 0810  BP: 106/78  Pulse: (!) 106  Temp: 97.7 F (36.5 C)  TempSrc: Oral  SpO2: 98%  Weight: 188 lb 1.9 oz (85.3 kg)  Height: 4\' 9"  (1.448 m)    General: Well developed, well nourished, in no acute distress  Skin : Warm and dry.  Head: Normocephalic and atraumatic  Lungs: Respirations unlabored; Neurologic: Alert and oriented; speech intact; face symmetrical; moves all extremities well; CNII-XII intact without focal deficit   Assessment:  1. Dysuria     Plan:  Repeat urine culture today; based on culture from 11/14, suspect patient still has proteus infection; will extend treatment with Ceftin x 7 days; stressed the absolute necessity of getting the patient back to her urologist due to recurrent infections.  Also encouraged mother to schedule a follow-up  with patient's PCP as she has not seen her in a number of months.   No follow-ups on file.  Orders Placed This Encounter  Procedures  . Urine Culture    Standing Status:   Future    Standing Expiration Date:   02/04/2019    Requested Prescriptions    No prescriptions requested or ordered in this encounter

## 2018-02-04 NOTE — Addendum Note (Signed)
Addended by: Karma GanjaSMITH, Brithney Bensen J on: 02/04/2018 09:29 AM   Modules accepted: Orders

## 2018-02-04 NOTE — Telephone Encounter (Signed)
Spoke to VS regarding below message VS advised he will call patient this week.  VS please advise once patient has been contacted. Thank you.

## 2018-02-06 LAB — URINE CULTURE
MICRO NUMBER: 91398919
SPECIMEN QUALITY: ADEQUATE

## 2018-02-06 NOTE — Telephone Encounter (Signed)
Spoke with pt's mother.  She want to get her daughter a CPAP machine.  She reviewed sleep apnea on-line and said her daughter has all the symptoms.    I explained to the pt's mother that Lori Miles has undergone several sleeps studies, but she doesn't meet diagnostic criteria for sleep apnea.  [name] says she spoke with insurance provider and was told that Lori Miles can get a CPAP machine anyway.  I asked the [name] to verify again with insurance, and ask them to forward information to me.  It this is indeed the case, then can arrange for CPAP set up.  I did explain that in my experience it would be unusual for insurance to cover CPAP therapy without a sleep study documenting sleep apnea with an AHI > 5 events per hour.

## 2018-02-16 NOTE — Telephone Encounter (Signed)
LVM for pt's mother regarding insurance info for sleep cpap machine. X1

## 2018-02-18 ENCOUNTER — Other Ambulatory Visit: Payer: Self-pay | Admitting: *Deleted

## 2018-02-18 DIAGNOSIS — G4733 Obstructive sleep apnea (adult) (pediatric): Secondary | ICD-10-CM | POA: Diagnosis not present

## 2018-02-18 DIAGNOSIS — G809 Cerebral palsy, unspecified: Secondary | ICD-10-CM | POA: Diagnosis not present

## 2018-02-18 MED ORDER — POLYETHYLENE GLYCOL 3350 17 GM/SCOOP PO POWD: ORAL | 3 refills | Status: DC

## 2018-02-19 ENCOUNTER — Ambulatory Visit (INDEPENDENT_AMBULATORY_CARE_PROVIDER_SITE_OTHER): Payer: Medicare Other | Admitting: Family

## 2018-02-19 ENCOUNTER — Encounter (INDEPENDENT_AMBULATORY_CARE_PROVIDER_SITE_OTHER): Payer: Self-pay | Admitting: Family

## 2018-02-19 ENCOUNTER — Telehealth: Payer: Self-pay | Admitting: Internal Medicine

## 2018-02-19 VITALS — BP 110/68 | HR 92 | Ht <= 58 in | Wt 184.8 lb

## 2018-02-19 DIAGNOSIS — G40209 Localization-related (focal) (partial) symptomatic epilepsy and epileptic syndromes with complex partial seizures, not intractable, without status epilepticus: Secondary | ICD-10-CM

## 2018-02-19 DIAGNOSIS — G4700 Insomnia, unspecified: Secondary | ICD-10-CM

## 2018-02-19 DIAGNOSIS — G478 Other sleep disorders: Secondary | ICD-10-CM | POA: Diagnosis not present

## 2018-02-19 DIAGNOSIS — F5105 Insomnia due to other mental disorder: Secondary | ICD-10-CM

## 2018-02-19 DIAGNOSIS — G40309 Generalized idiopathic epilepsy and epileptic syndromes, not intractable, without status epilepticus: Secondary | ICD-10-CM

## 2018-02-19 DIAGNOSIS — F919 Conduct disorder, unspecified: Secondary | ICD-10-CM

## 2018-02-19 DIAGNOSIS — G4719 Other hypersomnia: Secondary | ICD-10-CM

## 2018-02-19 DIAGNOSIS — F840 Autistic disorder: Secondary | ICD-10-CM

## 2018-02-19 DIAGNOSIS — F819 Developmental disorder of scholastic skills, unspecified: Secondary | ICD-10-CM | POA: Diagnosis not present

## 2018-02-19 DIAGNOSIS — R296 Repeated falls: Secondary | ICD-10-CM

## 2018-02-19 NOTE — Telephone Encounter (Signed)
LVM for pt's mother regarding insurance info for sleep cpap machine. X2

## 2018-02-19 NOTE — Progress Notes (Signed)
Patient: Lori Miles MRN: [id number] Sex: female DOB: 11-05-1995  Provider: Elveria Rising, NP Location of Care: [facility]  Note type: Routine return visit  History of Present Illness: Referral Source: Lori Contras, MD History from: mother, patient and [facility] chart Chief Complaint: Epilepsy  Lori Miles is a 22 y.o. young woman with history of generalized convulsive and partial epilepsy, intellectual disabilities and problems with sleep. She was last seen September 04, 2017. Lori Miles is taking and tolerating Depakote for her seizure disorder and has remained seizure free for more than a year. Mom reports today that Lori Miles has been recently treated for a urinary tract infection and that she typically has a sinus infection along with or right after a UTI. Mom said that for the past couple of days, Lori Miles has been having increased nasal secretions and complaining of pain in her nose. Mom has contacted her PCP but says that she may take her to ER or Urgent Care for treatment after this appointment. Lori Miles tends to be more moody and less compliant when she doesn't feel well, and is exhibiting that today. She also has jerking behavior and more trouble walking when she has an infection and is doing that as well.   Lori Miles continues to be quite overweight and Mom says that she has asked her day program to reduce her snacks and to give her water instead of soft drinks. She has trouble with getting Lori Miles to exercise because she has reduced stamina and endurance, and fatigues quickly. She can fall easily, particularly when walking for more than a few minutes. Mom frequently uses a wheelchair for outings for this reason.  Lori Miles requires help with dressing and bathing, as well as toilet hygiene. She is unable to tie her shoes.   Lori Miles has disordered sleep and tends to sleep more during the day than at night. When she is awake at night, she is not disruptive but tends to play in  her room. When she does sleep at night, she tends to have gasping respirations and Mom believes that she needs CPAP. It is my understanding that sleep studies have not proven that need.  Lori Miles has been otherwise generally healthy since she was last seen. Mom has no other health concerns for Lori Miles today other than previously mentioned.  Review of Systems: Please see the HPI for neurologic and other pertinent review of systems. Otherwise, all other systems were reviewed and were negative.    Past Medical History:  Diagnosis Date  . Asthma   . Autism    mom states no actual dx, but admits she has some symptoms  . Bacteriuria   . Chronic constipation   . Cognitive developmental delay   . Disruptive behavior disorder   . Family history of adverse reaction to anesthesia    mother had itching after anesthesia one time  . Frequency-urgency syndrome   . Gait disorder    ORGANIC PER NERUOLGIST NOTE (DR HICKLING)  . 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 GERD (gastroesophageal reflux disease)   . Gout   . H/O sinus tachycardia   . History of acute respiratory failure 02/14/2015   SECONDARY TO CAP  . History of recurrent UTIs   . Interstitial cystitis   . Moderate intellectual disability   . OSA (obstructive sleep apnea)    no machine yet   . Partial epilepsy with impairment of consciousness (HCC)    FOLLOWED BY DR HICKLING  . Pneumonia 2018 last time  .  PONV (postoperative nausea and vomiting)   . Seizure (HCC)    most recent sz " at the end of summer"-last sz years ago per mother  . Urinary incontinence    Hospitalizations: No., Head Injury: No., Nervous System Infections: No., Immunizations up to date: Yes.   Past Medical History Comments: See HPI Copied from previous record: EEG on September 14, 2011, was normal. She was able to be successfully weaned off Keppra. The patient has significant issues with aggression, which was thought to be related to  Keppra, but has persisted once it was discontinued. The patient was treated with Depakote both for behavior and possible seizures. She had palpitations and severe constipation on Seroquel. She had marked weight gain on Abilify and Depakote. When generic Divalproex was tried she had breakthrough seizures.   Nocturnal polysomnogram on October 27, 2011, failed to show significant periodic limb movements, cyanosis, sleep apnea, or cardiac arrhythmia. EEG on February 20, 2012, was a normal study with the patient awake.   EEG performed March 25, 2017 was normal  Surgical History Past Surgical History:  Procedure Laterality Date  . CYSTOSCOPY Miles/A 06/03/2017   Procedure: CYSTOSCOPY WITH EXAM UNDER ANESTHESIA;  Surgeon: Jerilee Field, MD;  Location: [facility];  Service: Urology;  Laterality: Miles/A;  . CYSTOSCOPY WITH HYDRODISTENSION AND BIOPSY  04/14/2012   Procedure: CYSTOSCOPY/BIOPSY/HYDRODISTENSION;  Surgeon: Lindaann Slough, MD;  Location: [facility];  Service: Urology;;  instillation of marcaine and pyridium  . EUA/  PAP SMEAR/  BREAST EXAM  03-12-2016   dr Erin Fulling [facility]  . EUA/ VAGINOSCOPY/ COLPOSCOPY FO THE HYMENAL RING  10-04-2009    dr Tamela Oddi  [facility]  . TONSILLECTOMY      Family History family history includes Pancreatic cancer in her sister; Seizures in her mother and sister. Family History is otherwise negative for migraines, seizures, cognitive impairment, blindness, deafness, birth defects, chromosomal disorder, autism.  Social History Social History   Socioeconomic History  . Marital status: Single    Spouse name: Not on file  . Number of children: Not on file  . Years of education: Not on file  . Highest education level: Not on file  Occupational History  . Not on file  Social Needs  . Financial resource strain: Not on file  . Food insecurity:    Worry: Not on file    Inability: Not on file  . Transportation needs:    Medical:  Not on file    Non-medical: Not on file  Tobacco Use  . Smoking status: Never Smoker  . Smokeless tobacco: Never Used  Substance and Sexual Activity  . Alcohol use: No    Alcohol/week: 0.0 standard drinks  . Drug use: No  . Sexual activity: Never    Birth control/protection: Pill  Lifestyle  . Physical activity:    Days per week: Not on file    Minutes per session: Not on file  . Stress: Not on file  Relationships  . Social connections:    Talks on phone: Not on file    Gets together: Not on file    Attends religious service: Not on file    Active member of club or organization: Not on file    Attends meetings of clubs or organizations: Not on file    Relationship status: Not on file  Other Topics Concern  . Not on file  Social History Narrative   Lives with mom ([name] Lascano) and half-sister Yanice Maqueda) who is also mentally  impaired.  Has CAP assistance, urinary incontinence, needs help with feeding,dressing, toileting   Graduated from [employer].  She enjoys playing with cars, dancing, and dogs.    Allergies Allergies  Allergen Reactions  . Abilify [Aripiprazole] Swelling and Palpitations  . Seroquel [Quetiapine Fumarate] Palpitations  . Doxycycline Other (See Comments)    Stomach cramps  . Amoxicillin-Pot Clavulanate Hives    Has patient had a PCN reaction causing immediate rash, facial/tongue/throat swelling, SOB or lightheadedness with hypotension: No Has patient had a PCN reaction causing severe rash involving mucus membranes or skin necrosis: No Has patient had a PCN reaction that required hospitalization: No Has patient had a PCN reaction occurring within the last 10 years: No If all of the above answers are "NO", then may proceed with Cephalosporin use.   Marland Kitchen Keppra [Levetiracetam] Other (See Comments)    Aggressive behavior     Physical Exam BP 110/68   Pulse 92   Ht 4' 9.5" (1.461 m)   Wt 184 lb 12.8 oz (83.8 kg)   BMI 39.30 kg/m    General: well developed, well nourished obese young woman, seated on exam room, in no evident distress; black hair, brown eyes, right handed Head: normocephalic and atraumatic. Oropharynx benign. No dysmorphic features. Neck: supple with no carotid bruits. Cardiovascular: regular rate and rhythm, no murmurs. Respiratory: Clear to auscultation bilaterally Abdomen: Bowel sounds present all four quadrants, abdomen soft, non-tender, non-distended.  Musculoskeletal: No skeletal deformities or obvious scoliosis.  Skin: no rashes or neurocutaneous lesions  Neurologic Exam Mental Status: Awake and fully alert. Attention, concentration and fund of knowledge subnormal for age. She is irritable and resistant to examination today. Language is limited. Speech with dysarthria.  Smiles responsively. Has variable eye contact. Watching a video on a tablet and holding a baby doll in her arms.  Cranial Nerves: Fundoscopic exam - red reflex present.  Unable to fully visualize fundus.  Pupils equal briskly reactive to light.  Turns to localize faces and objects in the periphery. Turns to localize sounds in the periphery. Facial movements are symmetric.  Neck flexion and extension normal.  Motor: Normal functional bulk, tone and strength. She had difficulty following instructions for adequate strength testing. Sensory: Withdrawal x 4 Coordination: Unable to adequately assess due to patient's inability to participate in examination. No dysmetria when reaching for objects. Gait and Station: Able to independently stand and bear weight. When walking, she has a waddling gait and trips easily.  Reflexes: Unable to adequately test due to her inability to cooperate.   Impression 1.  Partial epilepsy with impairment of consciousness 2.  Generalized convulsive epilepsy 3.  Insomnia and sleep arousals 4.  Intellectual disability 5.  Gait disorder 6.  At risk for falls   Recommendations for plan of care The patient's  previous [facility] records were reviewed. Lori Miles has neither had nor required imaging or lab studies since the last visit other than what was performed by her PCP. She is a 22 year old young woman with history of generalized and partial epilepsy, intellectual disability, insomnia and sleep arousals, and gait disorder. She is taking and tolerating Depakote for her seizure disorder and has remained seizure free for over a year. Mom feels that Lori Miles has a sinus infection because she has had increased nasal congestion and pain in her nose. Mom has contacted her PCP but also says that she may take her to Urgent Care or ER for her symptoms. I will see Lori Miles back in follow  up in 6 months or sooner if needed. Mom agreed with the plans made today.   The medication list was reviewed and reconciled.  No changes were made in the prescribed medications today.  A complete medication list was provided to the patient's mother.  Allergies as of 02/19/2018      Reactions   Abilify [aripiprazole] Swelling, Palpitations   Seroquel [quetiapine Fumarate] Palpitations   Doxycycline Other (See Comments)   Stomach cramps   Amoxicillin-pot Clavulanate Hives   Has patient had a PCN reaction causing immediate rash, facial/tongue/throat swelling, SOB or lightheadedness with hypotension: No Has patient had a PCN reaction causing severe rash involving mucus membranes or skin necrosis: No Has patient had a PCN reaction that required hospitalization: No Has patient had a PCN reaction occurring within the last 10 years: No If all of the above answers are "NO", then may proceed with Cephalosporin use.   Keppra [levetiracetam] Other (See Comments)   Aggressive behavior       Medication List        Accurate as of 02/19/18 10:39 AM. Always use your most recent med list.          acetaminophen 325 MG tablet Commonly known as:  TYLENOL Take 2 tablets (650 mg total) by mouth every 6 (six) hours as needed for mild pain or  moderate pain.   albuterol (2.5 MG/3ML) 0.083% nebulizer solution Commonly known as:  PROVENTIL INHALE CONTENTS OF 1 VIAL IN NEBULIZER EVERY 4 TO 6 HOURS IF NEEDED FOR COUGH OR WHEEZING   benzonatate 100 MG capsule Commonly known as:  TESSALON Take 2 capsules (200 mg total) by mouth 2 (two) times daily as needed for cough.   budesonide 1 MG/2ML nebulizer solution Commonly known as:  PULMICORT Take 2 mLs (1 mg total) by nebulization 2 (two) times daily.   budesonide 0.5 MG/2ML nebulizer solution Commonly known as:  PULMICORT TAKE 4 MILLILITERS BY NEBULIZATION TWICE A DAY AS DIRECTED   cefUROXime 500 MG tablet Commonly known as:  CEFTIN Take 1 tablet (500 mg total) by mouth 2 (two) times daily with a meal.   cefUROXime 500 MG tablet Commonly known as:  CEFTIN Take 1 tablet (500 mg total) by mouth 2 (two) times daily with a meal.   cetirizine 10 MG tablet Commonly known as:  ZYRTEC TAKE 1 TABLET BY MOUTH EVERY DAY FOR RUNNY NOSE OR ITCHING. NEED OFFICE VISIT FOR REFILLS   cloNIDine 0.1 MG tablet Commonly known as:  CATAPRES TAKE 1 AND 1/2 TABLET BY MOUTH AT BEDTIME   DEPAKOTE 500 MG DR tablet Generic drug:  divalproex Give 1 tablet in the morning and 2 tablets at night   Levonorgestrel-Ethinyl Estradiol 0.15-0.03 &0.01 MG tablet Commonly known as:  AMETHIA,CAMRESE Take 1 tablet by mouth daily.   Melatonin 3 MG Tabs Take 3 mg by mouth at bedtime. For insomnia   mometasone 50 MCG/ACT nasal spray Commonly known as:  NASONEX Place 2 sprays into the nose daily.   montelukast 10 MG tablet Commonly known as:  SINGULAIR TAKE 1 TABLET BY MOUTH EVERY EVENING TO PREVENT COUGH OR WHEEZING NEEDS AN OFFICE VISIT FOR FURTHER REFILLS   ondansetron 4 MG disintegrating tablet Commonly known as:  ZOFRAN-ODT Take 1 tablet (4 mg total) by mouth every 6 (six) hours as needed.   ondansetron 4 MG tablet Commonly known as:  ZOFRAN Take 1 tablet (4 mg total) by mouth every 6 (six)  hours.   PERFOROMIST 20 MCG/2ML nebulizer solution Generic  drug:  formoterol PLACE 2 MLS BY NEBULIZATION 2 TIMES DAILY   polyethylene glycol powder powder Commonly known as:  GLYCOLAX/MIRALAX Dissolve 17 grasm in at least 8 ounces water/juice and drink by mouth twice daily   ranitidine 150 MG tablet Commonly known as:  ZANTAC Take 1 tablet (150 mg total) by mouth 2 (two) times daily. Pharmacy-please d/c rx for 300 mg dosing   senna 8.6 MG tablet Commonly known as:  SENOKOT Take 1 tablet by mouth at bedtime.   traZODone 50 MG tablet Commonly known as:  DESYREL Take 2 tablets (100 mg total) by mouth at bedtime.       Total time spent with the patient was 20 minutes, of which 50% or more was spent in counseling and coordination of care.   Lori Risingina Eda Magnussen NP-C

## 2018-02-19 NOTE — Telephone Encounter (Signed)
Mother calling stating that script for miralax is written for powder to be mixed in water or juice. States patient can only have water or she gets a UTI which then lands her with pneumonia and then in hospital. I explained that the script is written for miralax 17 grams or 1 capful twice daily in water or juice as this is how the medication is typically written. However, if Novelle cannot have juice, she is free to give it to her in water only. Mrs. Chandonnet then indicated that "the nurse" kept changing the script. I advised that the script remains the same as in 11/2016 office visit. I did offer to call the pharmacy to ask that they write on her script only the water portion and leave "juice" portion off but she says her pharmacy knows to do this already. She continues telling me that [name] can only have water. I again advised that if this is her preference, she should absolutely give her water.

## 2018-02-20 ENCOUNTER — Other Ambulatory Visit: Payer: Medicare Other

## 2018-02-20 ENCOUNTER — Encounter (HOSPITAL_COMMUNITY): Payer: Self-pay

## 2018-02-20 ENCOUNTER — Emergency Department (HOSPITAL_COMMUNITY)
Admission: EM | Admit: 2018-02-20 | Discharge: 2018-02-20 | Disposition: A | Discharge status: Home / Self Care | Payer: Medicare Other | Attending: Emergency Medicine | Admitting: Emergency Medicine

## 2018-02-20 ENCOUNTER — Encounter (INDEPENDENT_AMBULATORY_CARE_PROVIDER_SITE_OTHER): Payer: Self-pay | Admitting: Family

## 2018-02-20 ENCOUNTER — Ambulatory Visit (INDEPENDENT_AMBULATORY_CARE_PROVIDER_SITE_OTHER): Payer: Medicare Other | Admitting: Internal Medicine

## 2018-02-20 ENCOUNTER — Encounter: Payer: Self-pay | Admitting: Internal Medicine

## 2018-02-20 VITALS — BP 120/80 | HR 86 | Temp 98.6°F | Ht <= 58 in

## 2018-02-20 DIAGNOSIS — R300 Dysuria: Secondary | ICD-10-CM | POA: Diagnosis not present

## 2018-02-20 DIAGNOSIS — J301 Allergic rhinitis due to pollen: Secondary | ICD-10-CM

## 2018-02-20 DIAGNOSIS — R0981 Nasal congestion: Secondary | ICD-10-CM | POA: Diagnosis not present

## 2018-02-20 DIAGNOSIS — J45909 Unspecified asthma, uncomplicated: Secondary | ICD-10-CM | POA: Insufficient documentation

## 2018-02-20 DIAGNOSIS — R82998 Other abnormal findings in urine: Secondary | ICD-10-CM | POA: Insufficient documentation

## 2018-02-20 DIAGNOSIS — R112 Nausea with vomiting, unspecified: Secondary | ICD-10-CM

## 2018-02-20 DIAGNOSIS — J310 Chronic rhinitis: Secondary | ICD-10-CM

## 2018-02-20 DIAGNOSIS — F79 Unspecified intellectual disabilities: Secondary | ICD-10-CM | POA: Diagnosis not present

## 2018-02-20 DIAGNOSIS — N390 Urinary tract infection, site not specified: Secondary | ICD-10-CM | POA: Diagnosis not present

## 2018-02-20 DIAGNOSIS — Z79899 Other long term (current) drug therapy: Secondary | ICD-10-CM | POA: Insufficient documentation

## 2018-02-20 LAB — POCT URINALYSIS DIPSTICK
Bilirubin, UA: POSITIVE
Glucose, UA: NEGATIVE
Nitrite, UA: NEGATIVE
PH UA: 6 (ref 5.0–8.0)
PROTEIN UA: POSITIVE — AB
RBC UA: NEGATIVE
Spec Grav, UA: >=1.03 — AB (ref 1.010–1.025)
UROBILINOGEN UA: 1 U/dL

## 2018-02-20 LAB — URINALYSIS, ROUTINE W REFLEX MICROSCOPIC
Bacteria, UA: NONE SEEN
Glucose, UA: 50 mg/dL — AB
Hgb urine dipstick: NEGATIVE
Ketones, ur: 5 mg/dL — AB
Nitrite: NEGATIVE
Protein, ur: NEGATIVE mg/dL
Specific Gravity, Urine: 1.034 — ABNORMAL HIGH (ref 1.005–1.030)
pH: 5 (ref 5.0–8.0)

## 2018-02-20 MED ORDER — CLONIDINE HCL 0.1 MG PO TABS: ORAL_TABLET | ORAL | 5 refills | Status: DC

## 2018-02-20 MED ORDER — DEPAKOTE 500 MG PO TBEC: DELAYED_RELEASE_TABLET | ORAL | 5 refills | Status: DC

## 2018-02-20 MED ORDER — TRAZODONE HCL 50 MG PO TABS: 100.0000 mg | ORAL_TABLET | Freq: Every day | ORAL | 5 refills | Status: DC

## 2018-02-20 MED ORDER — METHYLPREDNISOLONE ACETATE 40 MG/ML IJ SUSP
40.0000 mg | Freq: Once | INTRAMUSCULAR | Status: AC
  Administered 2018-02-20: 40 mg via INTRAMUSCULAR

## 2018-02-20 NOTE — ED Notes (Signed)
Spoke to APP about patient and states due to recent notes from PCP and concerns for patient's mental status and other concerns, she does not feel patient is appropriate for minor care area.  Will upgrade acuity and focus on rooming patient somewhere more appropriate.

## 2018-02-20 NOTE — Patient Instructions (Signed)
Thank you for coming in today.   Instructions for you until your next appointment are as follows: 1. Continue giving Lori Miles her medications as you have been doing. 2. Let me know if she has any seizures 3. Please sign up for MyChart if you have not done so 4. Please plan to return for follow up in 6 months or sooner if needed.

## 2018-02-20 NOTE — Addendum Note (Signed)
Addended by: Berton LanGULCH, Hoda Hon R on: 02/20/2018 08:38 AM   Modules accepted: Orders

## 2018-02-20 NOTE — Assessment & Plan Note (Signed)
Her gagging and regurgitation on exam appears to be triggered by inability to manage nasal drainage rather than true nausea. Mom states she is not able to keep anything down recently and feel this represents gagging on secretions rather than true stomach problems and mom agrees.

## 2018-02-20 NOTE — Patient Instructions (Signed)
We have given you the steroid injection today to help the sinuses.   We are sending the urine for culture. It looks more dehydrated that infected so we will wait for the culture.

## 2018-02-20 NOTE — Assessment & Plan Note (Signed)
Mom is sure that daughter has infection. POC u/a done at visit which is more consistent with dehydration than infection but urine culture is sent to ensure no bacteria. Treat as appropriate. They are having a hard time finding a urologist who will treat them.

## 2018-02-20 NOTE — Discharge Instructions (Signed)
You were seen in the ER for nasal congestion, darker urine.  Urine sample today showed some white blood cells and leukocytes but no bacteria.  Given the number of UTIs and multiple rounds of antibiotics that you have been on, we will wait for urine culture today to determine if you need antibiotics.  Monitor for fever greater than 100.59F.  Follow-up with primary care doctor in the next 48 hours for reevaluation.

## 2018-02-20 NOTE — ED Triage Notes (Signed)
Pt reports continued urinary symptoms, was treated with abx for UTI for the past few weeks but mother reports she is still having symptoms. Cough, congestion and sore throat.

## 2018-02-20 NOTE — Assessment & Plan Note (Signed)
Recent ceftin course would cover bacterial sinusitis. I suspect just overproduction due to allergies. She is not able to use nasal corticosteroids. Asked mom to try sinus rinses as these would help ease burden of production. Do not feel additional antibiotics are warranted here. Depo-medrol 40 mg IM given at visit.

## 2018-02-20 NOTE — ED Provider Notes (Signed)
[facility] EMERGENCY DEPARTMENT Provider Note   CSN: [id number] Arrival date & time: 02/20/18  2040     History   Chief Complaint Chief Complaint  Patient presents with  . Recurrent UTI  . URI    HPI Lori Miles is a 22 y.o. female with history of epilepsy, intellectual disabilities, insomnia, chronic rhinitis, frequent UTIs, is here for evaluation of concern for UTI and sinus infection.  Patient is asleep in the bed, easily arousable.  She is smiling.  She denies any pain to me.  She is asking for Sprite and peanut butter graham crackers.  History is obtained from mother at bedside.  Mother states that patient has had symptoms of UTI and sinus infeciton for at least 4 to 5 weeks.  She was on cefuroxime 11/15-12/6, finished yesterday for UTI that grew preteus on urine culture, sensitive to cephalosporin.  Mother states UTI symptoms have not improved.  She reports orange urine, strong odor to her urine and patient has been pulling on her groin/pants more frequently.  Additionally has noticed that patient is sleeping more and she is "shaking" intermittently throughout the day.  She reports a fever up to "100 and something" 3 days ago but none since.  Mother states every time pt has a bad UTI she also gets a sinus infection.  She reports increased nasal congestion, nose and throat burning, stuffiness and drainage for over 1 month. Patient is having post nasal drip causing her to gag and vomit her food.  Mother is concerned that patient is resistant to cefuroxime and she needs something "stronger".  Unfortunately, patient's mother is a difficult historian.  She is tangential and speaks very fast.  States that patient has had the symptoms for years but lately antibiotics are not clearing the symptoms. Mother states pt has been seen by multiple specialists and no one has prescribed the right antibiotic.  She has been by ENT, pulmonology, OB/GYN, PCP several times for recurrent  chronic sinus congestion and UTIs.  Mother states if her sinus congestion lingers too long she will pneumonia. In the last year she has had 11 chest x-rays. She has had monthly urinalysis in the last year. States that urology Dr. Mena Goes has told her that there are no further interventions for the symptoms.  States ENT doctor is "scared of her" and does not want to intervene because of her intellectual disability.  Patient was seen recently by PCP yesterday for the same symptoms.  At that time the urine sample did not show infection, a urine culture was sent.  Mother feels like patient needs a stronger antibiotic to "clear the infection".  HPI  Past Medical History:  Diagnosis Date  . Asthma   . Autism    mom states no actual dx, but admits she has some symptoms  . Bacteriuria   . Chronic constipation   . Cognitive developmental delay   . Disruptive behavior disorder   . Family history of adverse reaction to anesthesia    mother had itching after anesthesia one time  . Frequency-urgency syndrome   . Gait disorder    ORGANIC PER NERUOLGIST NOTE (DR HICKLING)  . 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 GERD (gastroesophageal reflux disease)   . Gout   . H/O sinus tachycardia   . History of acute respiratory failure 02/14/2015   SECONDARY TO CAP  . History of recurrent UTIs   . Interstitial cystitis   . Moderate intellectual disability   .  OSA (obstructive sleep apnea)    no machine yet   . Partial epilepsy with impairment of consciousness (HCC)    FOLLOWED BY DR HICKLING  . Pneumonia 2018 last time  . PONV (postoperative nausea and vomiting)   . Seizure (HCC)    most recent sz " at the end of summer"-last sz years ago per mother  . Urinary incontinence     Patient Active Problem List   Diagnosis Date Noted  . Dysuria 02/20/2018  . Allergic rhinitis 04/03/2017  . Recurrent falls 03/21/2017  . Weakness 03/21/2017  . Venous insufficiency 03/21/2017  .  Excessive daytime sleepiness 03/05/2016  . 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 Cough variant asthma vs UACS/ vcd  03/15/2015  . Autism spectrum disorder 02/14/2015  . Nausea with vomiting 02/14/2015  . Obesity, morbid (HCC) 12/13/2014  . 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 Acanthosis nigricans 11/14/2014  . Toeing-in 08/29/2014  . 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 Recurrent urinary tract infection 08/01/2011  . Asthma 08/01/2011  . Chronic constipation 08/01/2011  . Contraception 08/01/2011    Past Surgical History:  Procedure Laterality Date  . CYSTOSCOPY N/A 06/03/2017   Procedure: CYSTOSCOPY WITH EXAM UNDER ANESTHESIA;  Surgeon: Jerilee Field, MD;  Location: [facility];  Service: Urology;  Laterality: N/A;  . CYSTOSCOPY WITH HYDRODISTENSION AND BIOPSY  04/14/2012   Procedure: CYSTOSCOPY/BIOPSY/HYDRODISTENSION;  Surgeon: Lindaann Slough, MD;  Location:  SURGERY CENTER;  Service: Urology;;  instillation of marcaine and pyridium  . EUA/  PAP SMEAR/  BREAST EXAM  03-12-2016   dr Erin Fulling [facility]  . EUA/ VAGINOSCOPY/ COLPOSCOPY FO THE HYMENAL RING  10-04-2009    dr Tamela Oddi  [facility]  . TONSILLECTOMY       OB History   None      Home Medications    Prior to Admission medications   Medication Sig Start Date End Date Taking? Authorizing Provider  acetaminophen (TYLENOL) 325 MG tablet Take 2 tablets (650 mg total) by mouth every 6 (six) hours as needed for mild pain or moderate pain. 02/27/17   Everlene Farrier, PA-C  albuterol (PROVENTIL) (2.5 MG/3ML) 0.083% nebulizer solution INHALE CONTENTS OF 1 VIAL IN NEBULIZER EVERY 4 TO 6 HOURS IF NEEDED FOR COUGH OR WHEEZING Patient taking differently: Take 2.5 mg by  nebulization every 4 (four) hours as needed for wheezing.  07/07/17   Kozlow, Alvira Philips, MD  benzonatate (TESSALON) 100 MG capsule Take 2 capsules (200 mg total) by mouth 2 (two) times daily as needed for cough. 12/18/17   Bast, Traci A, NP  budesonide (PULMICORT) 0.5 MG/2ML nebulizer solution TAKE 4 MILLILITERS BY NEBULIZATION TWICE A DAY AS DIRECTED 01/11/18   [provider]  budesonide (PULMICORT) 1 MG/2ML nebulizer solution Take 2 mLs (1 mg total) by nebulization 2 (two) times daily. 11/12/17   Kozlow, Alvira Philips, MD  cetirizine (ZYRTEC) 10 MG tablet TAKE 1 TABLET BY MOUTH EVERY DAY FOR RUNNY NOSE OR ITCHING. NEED OFFICE VISIT FOR REFILLS Patient taking differently: Take 10 mg by mouth daily.  10/31/17   Kozlow, Alvira Philips, MD  cloNIDine (CATAPRES) 0.1 MG tablet TAKE 1 AND 1/2 TABLET BY MOUTH AT BEDTIME 02/20/18   Elveria Rising, NP  DEPAKOTE 500 MG DR tablet Give 1 tablet in the morning and 2 tablets  at night 02/20/18   Elveria Rising, NP  Levonorgestrel-Ethinyl Estradiol (SEASONIQUE) 0.15-0.03 &0.01 MG tablet Take 1 tablet by mouth daily. 01/29/18   Sharyon Cable, CNM  Melatonin 3 MG TABS Take 3 mg by mouth at bedtime. For insomnia    [provider]  mometasone (NASONEX) 50 MCG/ACT nasal spray Place 2 sprays into the nose daily. 09/30/17   Kozlow, Alvira Philips, MD  montelukast (SINGULAIR) 10 MG tablet TAKE 1 TABLET BY MOUTH EVERY EVENING TO PREVENT COUGH OR WHEEZING NEEDS AN OFFICE VISIT FOR FURTHER REFILLS Patient taking differently: Take 10 mg by mouth daily.  10/31/17   Kozlow, Alvira Philips, MD  ondansetron (ZOFRAN ODT) 4 MG disintegrating tablet Take 1 tablet (4 mg total) by mouth every 6 (six) hours as needed. 12/04/17   Ward, Layla Maw, DO  ondansetron (ZOFRAN) 4 MG tablet Take 1 tablet (4 mg total) by mouth every 6 (six) hours. 01/30/18   Fayrene Helper, PA-C  PERFOROMIST 20 MCG/2ML nebulizer solution PLACE 2 MLS BY NEBULIZATION 2 TIMES DAILY 01/12/18   Kozlow, Alvira Philips, MD  polyethylene  glycol powder (GLYCOLAX/MIRALAX) powder Dissolve 17 grasm in at least 8 ounces water/juice and drink by mouth twice daily 02/18/18   Pyrtle, Carie Caddy, MD  ranitidine (ZANTAC) 150 MG tablet Take 1 tablet (150 mg total) by mouth 2 (two) times daily. Pharmacy-please d/c rx for 300 mg dosing 11/12/17   Pyrtle, Carie Caddy, MD  senna (SENOKOT) 8.6 MG tablet Take 1 tablet by mouth at bedtime.     [provider]  traZODone (DESYREL) 50 MG tablet Take 2 tablets (100 mg total) by mouth at bedtime. 02/20/18   Elveria Rising, NP    Family History Family History  Problem Relation Age of Onset  . Seizures Mother   . Seizures Sister        1 sister has  . Pancreatic cancer Sister   . Colon cancer Neg Hx   . Colon polyps Neg Hx   . Esophageal cancer Neg Hx   . Rectal cancer Neg Hx   . Stomach cancer Neg Hx     Social History Social History   Tobacco Use  . Smoking status: Never Smoker  . Smokeless tobacco: Never Used  Substance Use Topics  . Alcohol use: No    Alcohol/week: 0.0 standard drinks  . Drug use: No     Allergies   Abilify [aripiprazole]; Seroquel [quetiapine fumarate]; Doxycycline; Amoxicillin-pot clavulanate; and Keppra [levetiracetam]   Review of Systems Review of Systems  Reason unable to perform ROS: intellectual disabilities   All other systems reviewed and are negative.    Physical Exam Updated Vital Signs BP 112/66 (BP Location: Right Arm)   Pulse 96   Temp 98.7 F (37.1 C) (Oral)   Resp 18   SpO2 97%   Physical Exam  Constitutional: She is oriented to person, place, and time. She appears well-developed and well-nourished.  Non toxic  HENT:  Head: Normocephalic and atraumatic.  Nose: Nose normal.  Moderate nasal mucosal edema, erythema but no rhinorrhea.  No sinus tenderness with percussion.  TMs normal bilaterally.  Oropharynx is normal without erythema.  Large tonsils but without erythema, exudates.  Uvula is midline.  Eyes: Pupils are equal, round,  and reactive to light. Conjunctivae and EOM are normal.  Neck: Normal range of motion.  Cardiovascular: Normal rate and regular rhythm.  Pulmonary/Chest: Effort normal and breath sounds normal.  No wheezing, crackles.  Abdominal: Soft. Bowel sounds are normal.  There is no tenderness.  No G/R/R. No suprapubic or CVA tenderness. Negative Murphy's and McBurney's. Active BS to lower quadrants.   Musculoskeletal: Normal range of motion.  Neurological: She is alert and oriented to person, place, and time.  Skin: Skin is warm and dry. Capillary refill takes less than 2 seconds.  Psychiatric: She has a normal mood and affect. Her behavior is normal.  Nursing note and vitals reviewed.    ED Treatments / Results  Labs (all labs ordered are listed, but only abnormal results are displayed) Labs Reviewed  URINALYSIS, ROUTINE W REFLEX MICROSCOPIC - Abnormal; Notable for the following components:      Result Value   APPearance HAZY (*)    Specific Gravity, Urine 1.034 (*)    Glucose, UA 50 (*)    Bilirubin Urine SMALL (*)    Ketones, ur 5 (*)    Leukocytes, UA LARGE (*)    All other components within normal limits    EKG None  Radiology No results found.  Procedures Procedures (including critical care time)  Medications Ordered in ED Medications - No data to display   Initial Impression / Assessment and Plan / ED Course  I have reviewed the triage vital signs and the nursing notes.  Pertinent labs & imaging results that were available during my care of the patient were reviewed by me and considered in my medical decision making (see chart for details).    Urinalysis today shows large leukocytes, 21-50 WBCs but no bacteria.  Patient is afebrile.  She has no suprapubic or CVA tenderness. Low suspicion for pyelonephritis, renal stone.  Given parental concern I did consider treating her with a cephalosporin however chart review shows that patient has had multiple rounds of antibiotics  for months.  I am concerned that continued overuse of antibiotics will lead to antibiotic resistance.  Most recently, patient had urine culture grew Proteus which was susceptible to cefuroxime she just finished yesterday.  I recommended that we await urine culture to determine if there is a true infection again, at that time appropriate antibiotics can be sent to the pharmacy.  Mother initially was adamant about getting an antibiotic prescription today but ultimately was agreeable to awaiting urine culture results to avoid overuse of antibiotics.  In regards to her nasal congestion, I reviewed Dr. Thurmon Fair last note in 2017.  At that time Dr. Annalee Genta documented chronic, recurrent symptoms of sore throat, cough, fever, nausea, vomiting, gagging, sinusitis.  This is a chronic issue.  Today there is no fever.  There is no nasal drainage.  No sinus tenderness.  She is afebrile.  I recommended nasal rinses, intranasal steroids, humidifier.  I do not think antibiotics for this are indicated.  Mother expressed concern that patient may have a pneumonia because her sinus congestion has been going on for weeks.  Patient's lung exam is completely normal.  She is afebrile without tachycardia, tachypnea, hypoxia.  No wheezing, crackles on lung exam.  My suspicion is low for pneumonia.  I have reassured mother about clinical exam and she is comfortable deferring chest x-ray today.  Patient has had 11 chest x-rays in the last month.  She has had monthly urinalysis in the last year.    Patient is ambulatory in the ER.  She is drinking Sprite and eating without any nausea, vomiting.  I recommended patient follow-up with PCP in the next 48 hours for reevaluation.  At that time, urine culture should be back.  Final Clinical Impressions(s) /  ED Diagnoses   Final diagnoses:  Nasal congestion  Rhinitis, chronic  Dark urine    ED Discharge Orders    None       Jerrell MylarGibbons, Declin Rajan J, PA-C 02/21/18 0019    Raeford RazorKohut,  Stephen, MD 02/21/18 1556

## 2018-02-20 NOTE — Progress Notes (Signed)
   Subjective:    Patient ID: Lori SchickJasmine Miles, female    DOB: [birth year], 22 y.o.   MRN: [id number]  HPI The patient is a 22 YO female coming in with mother for multiple concerns including sinus problems (feels she has had this 3-4 weeks, some sneezing, congestion, sinus pain, is gagging on snot and vomiting due to gagging, does taking zyrtec and singulair, mother states fevers during this time, just finished antibiotic a couple of days ago ceftin) and urine problems (mom feels she has urine infection, just finished ceftin a few days ago for uti, is having some dark urine and smell is different, her appetite is poor, denies incontinence or abdominal pain, admits fevers intermittent) and breathing concerns (mother doing nebulizer more, she is not moving around a lot recently so breathing is hard to watch, mother states hears wheezing, giving nebulizer treatments more often, does state she is having fevers, using zyrtec and singulair, she refuses to do nasal spray, sometimes will allow nose spray saline).   Review of Systems  Unable to perform ROS: Patient unresponsive (see HPI for mother's ROS)      Objective:   Physical Exam  Constitutional: She appears well-developed and well-nourished.  HENT:  Head: Normocephalic and atraumatic.  Some sinus soreness, some nose turbinate swelling, mouth with oropharynx red without drainage, gagging on snot during exam and gags up snot and saliva several times during exam  Eyes: EOM are normal.  Neck: Normal range of motion.  Cardiovascular: Normal rate and regular rhythm.  Rate slightly fast  Pulmonary/Chest: No stridor. No respiratory distress. She has no wheezes. She has no rales.  Effort poor as she did not follow commands, no audible wheezing and lung sounds normal given that  Abdominal: Soft. Bowel sounds are normal. She exhibits no distension and no mass. There is no tenderness. There is no rebound and no guarding.  Musculoskeletal: She exhibits no  edema.  Neurological: Coordination abnormal.  Sleepy during visit, would not follow commands due to drowsiness, mother put her in wheelchair due to concerns about gait instability and would not allow her to walk at all during visit  Skin: Skin is warm and dry.   Vitals:   02/20/18 0740  BP: 120/80  Pulse: 86  Temp: 98.6 F (37 C)  TempSrc: Oral  SpO2: 96%  Height: 4' 9.5" (1.461 m)      Assessment & Plan:  Depo-medrol 40 mg IM given at visit

## 2018-02-22 LAB — URINE CULTURE
MICRO NUMBER:: 91463661
SPECIMEN QUALITY: ADEQUATE

## 2018-02-23 ENCOUNTER — Other Ambulatory Visit: Payer: Self-pay | Admitting: Internal Medicine

## 2018-02-23 ENCOUNTER — Telehealth: Payer: Self-pay | Admitting: Internal Medicine

## 2018-02-23 ENCOUNTER — Other Ambulatory Visit: Payer: Self-pay

## 2018-02-23 MED ORDER — SULFAMETHOXAZOLE-TRIMETHOPRIM 800-160 MG PO TABS: 1.0000 | ORAL_TABLET | Freq: Two times a day (BID) | ORAL | 0 refills | Status: DC

## 2018-02-23 NOTE — Telephone Encounter (Signed)
Can give tylenol for pain. Would recommend to take the bactrim.

## 2018-02-23 NOTE — Telephone Encounter (Signed)
[name] states that Bactrim causes stomach cramps/pain and shaking.  She states she was told by ER physician not to give this medication to [facility].  Please advise.  [name] also states that Hala's UTI sx have not improved and she seems to be in some discomfort/pain.

## 2018-02-23 NOTE — Telephone Encounter (Signed)
Copied from CRM [phone]. Topic: General - Other >> Feb 23, 2018 10:02 AM Gean BirchwoodWilliams-Neal, Sade R wrote: Patients mother is calling in stating she cant take Bactrim due to it messing up her stomach  CB# [phone]

## 2018-02-23 NOTE — Telephone Encounter (Signed)
Patient's mother calling back requesting a call back from Dr. Frutoso Chaserawford's CMA regarding this medication.

## 2018-02-23 NOTE — Telephone Encounter (Signed)
Given how many antibiotics she has been on we have limited options and this is the best option. What does she mean, nausea/vomiting? Diarrhea? She has nausea medicine to take if needed and can take imodium for diarrhea if needed.

## 2018-02-23 NOTE — Telephone Encounter (Signed)
Phone went to voicemail again couldn't leave message [facility] to [facility] incase patients mother calls back

## 2018-02-23 NOTE — Telephone Encounter (Signed)
Tried calling phone when straight to VM unable to leave message [facility] inc[facility] mother calls back.

## 2018-02-24 NOTE — Telephone Encounter (Signed)
This reaction that she is describing is not an allergy but an intolerance. I would recommend to proceed with the previous plan documented in my note to use tylenol for pain if needed and take bactrim. Given the amount of antibiotics she has had recently there are only some options that we can use and given that [facility] already has several other listed allergies this also narrows the medications we can use also. I'm not sure what medication the prior note is referencing with sleep if this is a related issue or a separate issue. If separate issue I would be happy to talk to them about Lori Miles's sleeping at a visit.

## 2018-02-24 NOTE — Telephone Encounter (Signed)
[name] called back in to follow up on message sent. Relayed message in chart per provider.  Mother says that pt is not sleeping well. She said that pt said that the medication makes her feel out of it. Mom says that she told Luanna ColeBriana that pt is allergic to Bactrim. (Mom asked that I document this). Mom would has stopped pt from taking medication but would like to know if there is something different that pt could take that's like bactrum that could help?   CB: [phone]

## 2018-02-24 NOTE — Telephone Encounter (Signed)
Patients mother informed of MD response and stated understanding and has been giving Iisha the ABX and will give tylenol with the medication

## 2018-02-25 DIAGNOSIS — N390 Urinary tract infection, site not specified: Secondary | ICD-10-CM | POA: Diagnosis not present

## 2018-02-25 DIAGNOSIS — K5900 Constipation, unspecified: Secondary | ICD-10-CM | POA: Diagnosis not present

## 2018-02-25 DIAGNOSIS — R569 Unspecified convulsions: Secondary | ICD-10-CM | POA: Diagnosis not present

## 2018-02-25 DIAGNOSIS — N3281 Overactive bladder: Secondary | ICD-10-CM | POA: Diagnosis not present

## 2018-03-08 ENCOUNTER — Other Ambulatory Visit: Payer: Self-pay

## 2018-03-08 ENCOUNTER — Encounter (HOSPITAL_COMMUNITY): Payer: Self-pay | Admitting: Emergency Medicine

## 2018-03-08 ENCOUNTER — Emergency Department (HOSPITAL_COMMUNITY)
Admission: EM | Admit: 2018-03-08 | Discharge: 2018-03-08 | Disposition: A | Discharge status: Home / Self Care | Payer: Medicare Other | Attending: Emergency Medicine | Admitting: Emergency Medicine

## 2018-03-08 ENCOUNTER — Emergency Department (HOSPITAL_COMMUNITY): Payer: Medicare Other

## 2018-03-08 DIAGNOSIS — F840 Autistic disorder: Secondary | ICD-10-CM | POA: Insufficient documentation

## 2018-03-08 DIAGNOSIS — Z79899 Other long term (current) drug therapy: Secondary | ICD-10-CM | POA: Insufficient documentation

## 2018-03-08 DIAGNOSIS — J45909 Unspecified asthma, uncomplicated: Secondary | ICD-10-CM | POA: Diagnosis not present

## 2018-03-08 DIAGNOSIS — J4520 Mild intermittent asthma, uncomplicated: Secondary | ICD-10-CM | POA: Diagnosis not present

## 2018-03-08 DIAGNOSIS — R350 Frequency of micturition: Secondary | ICD-10-CM | POA: Diagnosis present

## 2018-03-08 DIAGNOSIS — R05 Cough: Secondary | ICD-10-CM | POA: Diagnosis not present

## 2018-03-08 DIAGNOSIS — R3915 Urgency of urination: Secondary | ICD-10-CM | POA: Diagnosis not present

## 2018-03-08 LAB — URINALYSIS, ROUTINE W REFLEX MICROSCOPIC
GLUCOSE, UA: NEGATIVE mg/dL
HGB URINE DIPSTICK: NEGATIVE
KETONES UR: NEGATIVE mg/dL
Leukocytes, UA: NEGATIVE
Nitrite: NEGATIVE
PH: 5.5 (ref 5.0–8.0)
Specific Gravity, Urine: >1.03 — ABNORMAL HIGH (ref 1.005–1.030)

## 2018-03-08 LAB — CBC WITH DIFFERENTIAL/PLATELET
ABS IMMATURE GRANULOCYTES: 0.23 10*3/uL — AB (ref 0.00–0.07)
Basophils Absolute: 0 10*3/uL (ref 0.0–0.1)
Basophils Relative: 0 %
Eosinophils Absolute: 0 10*3/uL (ref 0.0–0.5)
Eosinophils Relative: 0 %
HCT: 36.9 % (ref 36.0–46.0)
Hemoglobin: 11.5 g/dL — ABNORMAL LOW (ref 12.0–15.0)
Immature Granulocytes: 3 %
Lymphocytes Relative: 27 %
Lymphs Abs: 2.6 10*3/uL (ref 0.7–4.0)
MCH: 29.5 pg (ref 26.0–34.0)
MCHC: 31.2 g/dL (ref 30.0–36.0)
MCV: 94.6 fL (ref 80.0–100.0)
MONO ABS: 1.4 10*3/uL — AB (ref 0.1–1.0)
MONOS PCT: 15 %
NEUTROS ABS: 5.2 10*3/uL (ref 1.7–7.7)
Neutrophils Relative %: 55 %
Platelets: 220 10*3/uL (ref 150–400)
RBC: 3.9 MIL/uL (ref 3.87–5.11)
RDW: 13.7 % (ref 11.5–15.5)
WBC: 9.4 10*3/uL (ref 4.0–10.5)
nRBC: 0 % (ref 0.0–0.2)

## 2018-03-08 LAB — BASIC METABOLIC PANEL
Anion gap: 12 (ref 5–15)
BUN: 10 mg/dL (ref 6–20)
CALCIUM: 8.5 mg/dL — AB (ref 8.9–10.3)
CO2: 24 mmol/L (ref 22–32)
CREATININE: 1.03 mg/dL — AB (ref 0.44–1.00)
Chloride: 103 mmol/L (ref 98–111)
GFR calc Af Amer: >60 mL/min (ref >60)
GLUCOSE: 128 mg/dL — AB (ref 70–99)
Potassium: 4 mmol/L (ref 3.5–5.1)
Sodium: 139 mmol/L (ref 135–145)

## 2018-03-08 LAB — I-STAT BETA HCG BLOOD, ED (MC, WL, AP ONLY): I-stat hCG, quantitative: <5 m[IU]/mL (ref <5)

## 2018-03-08 MED ORDER — PREDNISONE 20 MG PO TABS
40.0000 mg | ORAL_TABLET | Freq: Once | ORAL | Status: AC
  Administered 2018-03-08: 40 mg via ORAL
  Filled 2018-03-08: qty 2

## 2018-03-08 MED ORDER — PREDNISONE 20 MG PO TABS: 40.0000 mg | ORAL_TABLET | Freq: Every day | ORAL | 0 refills | Status: AC

## 2018-03-08 NOTE — ED Provider Notes (Signed)
[facility] Provider Note   CSN: [id number] Arrival date & time: 03/08/18  0750     History   Chief Complaint Chief Complaint  Patient presents with  . Dysuria    HPI Lori SchickJasmine Miles is a 22 y.o. female.  The history is provided by the patient and a caregiver.  Urinary Frequency  This is a recurrent problem. The current episode started yesterday. The problem occurs constantly. The problem has not changed since onset.Pertinent negatives include no chest pain, no abdominal pain, no headaches and no shortness of breath. Associated symptoms comments: Wheezing/URI w/ relief of symptoms. Nothing aggravates the symptoms. Nothing relieves the symptoms. She has tried nothing for the symptoms. The treatment provided no relief.    Past Medical History:  Diagnosis Date  . Asthma   . Autism    mom states no actual dx, but admits she has some symptoms  . Bacteriuria   . Chronic constipation   . Cognitive developmental delay   . Disruptive behavior disorder   . Family history of adverse reaction to anesthesia    mother had itching after anesthesia one time  . Frequency-urgency syndrome   . Gait disorder    ORGANIC PER NERUOLGIST NOTE (DR HICKLING)  . 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 GERD (gastroesophageal reflux disease)   . Gout   . H/O sinus tachycardia   . History of acute respiratory failure 02/14/2015   SECONDARY TO CAP  . History of recurrent UTIs   . Interstitial cystitis   . Moderate intellectual disability   . OSA (obstructive sleep apnea)    no machine yet   . Partial epilepsy with impairment of consciousness (HCC)    FOLLOWED BY DR HICKLING  . Pneumonia 2018 last time  . PONV (postoperative nausea and vomiting)   . Seizure (HCC)    most recent sz " at the end of summer"-last sz years ago per mother  . Urinary incontinence     Patient Active Problem List   Diagnosis Date Noted  . Dysuria  02/20/2018  . Allergic rhinitis 04/03/2017  . Recurrent falls 03/21/2017  . Weakness 03/21/2017  . Venous insufficiency 03/21/2017  . Excessive daytime sleepiness 03/05/2016  . 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 Cough variant asthma vs UACS/ vcd  03/15/2015  . Autism spectrum disorder 02/14/2015  . Nausea with vomiting 02/14/2015  . Obesity, morbid (HCC) 12/13/2014  . 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 Acanthosis nigricans 11/14/2014  . Toeing-in 08/29/2014  . 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 Recurrent urinary tract infection 08/01/2011  . Asthma 08/01/2011  . Chronic constipation 08/01/2011  . Contraception 08/01/2011    Past Surgical History:  Procedure Laterality Date  . CYSTOSCOPY N/A 06/03/2017   Procedure: CYSTOSCOPY WITH EXAM UNDER ANESTHESIA;  Surgeon: Jerilee FieldEskridge, [name], MD;  [facility];  Service: Urology;  Laterality: N/A;  . CYSTOSCOPY WITH HYDRODISTENSION AND BIOPSY  04/14/2012   Procedure: CYSTOSCOPY/BIOPSY/HYDRODISTENSION;  Surgeon: Lindaann SloughMarc-Henry Nesi, MD;  Location: [facility];  Service: Urology;;  instillation of marcaine and pyridium  . EUA/  PAP SMEAR/  BREAST EXAM  03-12-2016   [facility]  . EUA/ VAGINOSCOPY/ COLPOSCOPY FO THE HYMENAL RING  10-04-2009    dr [facility]  .  TONSILLECTOMY       OB History   No obstetric history on file.      Home Medications    Prior to Admission medications   Medication Sig Start Date End Date Taking? Authorizing Provider  acetaminophen (TYLENOL) 325 MG tablet Take 2 tablets (650 mg total) by mouth every 6 (six) hours as needed for mild pain or moderate pain. 02/27/17   Everlene Farrier, PA-C  albuterol (PROVENTIL) (2.5 MG/3ML)  0.083% nebulizer solution INHALE CONTENTS OF 1 VIAL IN NEBULIZER EVERY 4 TO 6 HOURS IF NEEDED FOR COUGH OR WHEEZING Patient taking differently: Take 2.5 mg by nebulization every 4 (four) hours as needed for wheezing.  07/07/17   Kozlow, Alvira Philips, MD  benzonatate (TESSALON) 100 MG capsule Take 2 capsules (200 mg total) by mouth 2 (two) times daily as needed for cough. 12/18/17   Bast, Traci A, NP  budesonide (PULMICORT) 0.5 MG/2ML nebulizer solution TAKE 4 MILLILITERS BY NEBULIZATION TWICE A DAY AS DIRECTED 01/11/18   [provider]  budesonide (PULMICORT) 1 MG/2ML nebulizer solution Take 2 mLs (1 mg total) by nebulization 2 (two) times daily. 11/12/17   Kozlow, Alvira Philips, MD  cetirizine (ZYRTEC) 10 MG tablet TAKE 1 TABLET BY MOUTH EVERY DAY FOR RUNNY NOSE OR ITCHING. NEED OFFICE VISIT FOR REFILLS Patient taking differently: Take 10 mg by mouth daily.  10/31/17   Kozlow, Alvira Philips, MD  cloNIDine (CATAPRES) 0.1 MG tablet TAKE 1 AND 1/2 TABLET BY MOUTH AT BEDTIME 02/20/18   Elveria Rising, NP  DEPAKOTE 500 MG DR tablet Give 1 tablet in the morning and 2 tablets at night 02/20/18   Elveria Rising, NP  Levonorgestrel-Ethinyl Estradiol (SEASONIQUE) 0.15-0.03 &0.01 MG tablet Take 1 tablet by mouth daily. 01/29/18   Sharyon Cable, CNM  Melatonin 3 MG TABS Take 3 mg by mouth at bedtime. For insomnia    [provider]  mometasone (NASONEX) 50 MCG/ACT nasal spray Place 2 sprays into the nose daily. 09/30/17   Kozlow, Alvira Philips, MD  montelukast (SINGULAIR) 10 MG tablet TAKE 1 TABLET BY MOUTH EVERY EVENING TO PREVENT COUGH OR WHEEZING NEEDS AN OFFICE VISIT FOR FURTHER REFILLS Patient taking differently: Take 10 mg by mouth daily.  10/31/17   Kozlow, Alvira Philips, MD  ondansetron (ZOFRAN ODT) 4 MG disintegrating tablet Take 1 tablet (4 mg total) by mouth every 6 (six) hours as needed. 12/04/17   Ward, Layla Maw, DO  ondansetron (ZOFRAN) 4 MG tablet Take 1 tablet (4 mg total) by mouth every 6 (six) hours.  01/30/18   Fayrene Helper, PA-C  PERFOROMIST 20 MCG/2ML nebulizer solution PLACE 2 MLS BY NEBULIZATION 2 TIMES DAILY 01/12/18   Kozlow, Alvira Philips, MD  polyethylene glycol powder (GLYCOLAX/MIRALAX) powder Dissolve 17 grasm in at least 8 ounces water/juice and drink by mouth twice daily 02/18/18   Pyrtle, Carie Caddy, MD  predniSONE (DELTASONE) 20 MG tablet Take 2 tablets (40 mg total) by mouth daily for 4 days. 03/08/18 03/12/18  Nazanin Kinner, DO  ranitidine (ZANTAC) 150 MG tablet Take 1 tablet (150 mg total) by mouth 2 (two) times daily. Pharmacy-please d/c rx for 300 mg dosing 11/12/17   Pyrtle, Carie Caddy, MD  senna (SENOKOT) 8.6 MG tablet Take 1 tablet by mouth at bedtime.     [provider]  sulfamethoxazole-trimethoprim (BACTRIM DS,SEPTRA DS) 800-160 MG tablet Take 1 tablet by mouth 2 (two) times daily. 02/23/18   Myrlene Broker, MD  traZODone (DESYREL) 50 MG tablet Take  2 tablets (100 mg total) by mouth at bedtime. 02/20/18   Elveria RisingGoodpasture, Tina, NP    Family History Family History  Problem Relation Age of Onset  . Seizures Mother   . Seizures Sister        1 sister has  . Pancreatic cancer Sister   . Colon cancer Neg Hx   . Colon polyps Neg Hx   . Esophageal cancer Neg Hx   . Rectal cancer Neg Hx   . Stomach cancer Neg Hx     Social History Social History   Tobacco Use  . Smoking status: Never Smoker  . Smokeless tobacco: Never Used  Substance Use Topics  . Alcohol use: No    Alcohol/week: 0.0 standard drinks  . Drug use: No     Allergies   Abilify [aripiprazole]; Seroquel [quetiapine fumarate]; Doxycycline; Amoxicillin-pot clavulanate; and Keppra [levetiracetam]   Review of Systems Review of Systems  Constitutional: Negative for chills and fever.  HENT: Negative for ear pain and sore throat.   Eyes: Negative for pain and visual disturbance.  Respiratory: Positive for wheezing. Negative for cough and shortness of breath.   Cardiovascular: Negative for chest pain and  palpitations.  Gastrointestinal: Negative for abdominal pain and vomiting.  Genitourinary: Positive for frequency and urgency. Negative for difficulty urinating, dysuria, hematuria, vaginal bleeding and vaginal discharge.  Musculoskeletal: Negative for arthralgias and back pain.  Skin: Negative for color change and rash.  Neurological: Negative for seizures, syncope and headaches.  All other systems reviewed and are negative.    Physical Exam Updated Vital Signs  ED Triage Vitals [03/08/18 0759]  Enc Vitals Group     BP      Pulse      Resp      Temp 98.3 F (36.8 C)     Temp Source Oral     SpO2      Weight      Height      Head Circumference      Peak Flow      Pain Score      Pain Loc      Pain Edu?      Excl. in GC?     Physical Exam Vitals signs and nursing note reviewed.  Constitutional:      General: She is not in acute distress.    Appearance: She is well-developed.  HENT:     Head: Normocephalic and atraumatic.     Nose: Nose normal.     Mouth/Throat:     Mouth: Mucous membranes are moist.  Eyes:     Extraocular Movements: Extraocular movements intact.     Conjunctiva/sclera: Conjunctivae normal.     Pupils: Pupils are equal, round, and reactive to light.  Neck:     Musculoskeletal: Normal range of motion and neck supple.  Cardiovascular:     Rate and Rhythm: Normal rate and regular rhythm.     Pulses: Normal pulses.     Heart sounds: Normal heart sounds. No murmur.  Pulmonary:     Effort: Pulmonary effort is normal. No respiratory distress.     Breath sounds: Wheezing present.  Abdominal:     General: There is no distension.     Palpations: Abdomen is soft.     Tenderness: There is no abdominal tenderness.  Musculoskeletal: Normal range of motion.     Right lower leg: No edema.     Left lower leg: No edema.  Skin:    General: Skin is warm  and dry.     Capillary Refill: Capillary refill takes less than 2 seconds.  Neurological:     General:  No focal deficit present.     Mental Status: She is alert.     Cranial Nerves: No cranial nerve deficit.     Motor: No weakness.  Psychiatric:        Mood and Affect: Mood normal.      ED Treatments / Results  Labs (all labs ordered are listed, but only abnormal results are displayed) Labs Reviewed  BASIC METABOLIC PANEL - Abnormal; Notable for the following components:      Result Value   Glucose, Bld 128 (*)    Creatinine, Ser 1.03 (*)    Calcium 8.5 (*)    All other components within normal limits  URINALYSIS, ROUTINE W REFLEX MICROSCOPIC - Abnormal; Notable for the following components:   Color, Urine YELLOW (*)    APPearance CLEAR (*)    Specific Gravity, Urine >1.030 (*)    Bilirubin Urine SMALL (*)    Protein, ur TRACE (*)    All other components within normal limits  CBC WITH DIFFERENTIAL/PLATELET - Abnormal; Notable for the following components:   Hemoglobin 11.5 (*)    Monocytes Absolute 1.4 (*)    Abs Immature Granulocytes 0.23 (*)    All other components within normal limits  URINE CULTURE  CBC WITH DIFFERENTIAL/PLATELET  I-STAT BETA HCG BLOOD, ED ([facility], [facility], [facility] ONLY)    EKG None  Radiology Dg Chest 2 View  Result Date: 03/08/2018 CLINICAL DATA:  Cough, vomiting, upper respiratory symptoms EXAM: CHEST - 2 VIEW COMPARISON:  01/30/2018 FINDINGS: Low lung volumes with minor basilar streaky opacities compatible with atelectasis. Normal heart size and vascularity. No definite focal pneumonia, collapse or consolidation. Negative for edema, effusion or pneumothorax. Trachea is midline. Mild scoliosis of the spine. No acute osseous finding. IMPRESSION: Low volume exam with minor atelectasis. Electronically Signed   By: Judie Petit.  Shick M.D.   On: 03/08/2018 08:58    Procedures Procedures (including critical care time)  Medications Ordered in ED Medications  predniSONE (DELTASONE) tablet 40 mg (has no administration in time range)     Initial Impression / Assessment  and Plan / ED Course  I have reviewed the triage vital signs and the nursing notes.  Pertinent labs & imaging results that were available during my care of the patient were reviewed by me and considered in my medical decision making (see chart for details).     Lori Miles is a 21 year old female with history of asthma, autism who presents to the ED with cough, URI symptoms.  Concern for possible UTI.  Patient with normal vitals.  No fever.  Patient with coarse breath sounds on exam.  Otherwise normal work of breathing.  Exam is overall unremarkable.  Urinalysis was collected that showed no signs of infection.  Chest x-ray showed no signs of pneumonia, pneumothorax, pleural effusion.  Patient with no significant anemia, electrolyte abnormality, kidney injury.  Patient overall likely with mild asthma exacerbation.  Patient has been using nebulizer at home with good relief.  Patient given prednisone while in the ED.  Recommend continued use of breathing treatments at home and given short course of steroids for likely mild asthma exacerbation.  Given return precautions and discharged in ED in good condition.  This chart was dictated using voice recognition software.  Despite best efforts to proofread,  errors can occur which can change the documentation meaning.   Final  Clinical Impressions(s) / ED Diagnoses   Final diagnoses:  Mild asthma without complication, unspecified whether persistent    ED Discharge Orders         Ordered    predniSONE (DELTASONE) 20 MG tablet  Daily     03/08/18 1012           [place], DO 03/08/18 1013

## 2018-03-08 NOTE — ED Notes (Signed)
Pt returned from xray

## 2018-03-08 NOTE — ED Notes (Signed)
Patient transported to X-ray 

## 2018-03-08 NOTE — ED Triage Notes (Addendum)
Pt arrives to ED from home with complaints of pain on urination, frequent urination, cough, fever, and emesis. Pt placed in position of comfort with bed locked and lowered, call bell in reach.

## 2018-03-08 NOTE — ED Notes (Signed)
Patient & patient family member verbalizes understanding of discharge instructions. Opportunity for questioning and answers were provided. Armband removed by staff, pt discharged from ED.

## 2018-03-09 ENCOUNTER — Telehealth: Payer: Self-pay | Admitting: Allergy and Immunology

## 2018-03-09 DIAGNOSIS — R569 Unspecified convulsions: Secondary | ICD-10-CM | POA: Diagnosis not present

## 2018-03-09 DIAGNOSIS — J4520 Mild intermittent asthma, uncomplicated: Secondary | ICD-10-CM | POA: Diagnosis not present

## 2018-03-09 LAB — URINE CULTURE

## 2018-03-09 NOTE — Telephone Encounter (Signed)
Mom called and said that she need a predisone 60mg  . She was seen in er and they only gave 10mg  and dr Lucie Leatherkozlow always gave her 60mg  and it would help. [facility]

## 2018-03-09 NOTE — Telephone Encounter (Signed)
Please advise 

## 2018-03-10 ENCOUNTER — Emergency Department (HOSPITAL_COMMUNITY)
Admission: EM | Admit: 2018-03-10 | Discharge: 2018-03-10 | Disposition: A | Discharge status: Home / Self Care | Payer: Medicare Other | Attending: Emergency Medicine | Admitting: Emergency Medicine

## 2018-03-10 ENCOUNTER — Encounter (HOSPITAL_COMMUNITY): Payer: Self-pay | Admitting: Emergency Medicine

## 2018-03-10 ENCOUNTER — Other Ambulatory Visit: Payer: Self-pay

## 2018-03-10 DIAGNOSIS — R0981 Nasal congestion: Secondary | ICD-10-CM | POA: Insufficient documentation

## 2018-03-10 DIAGNOSIS — R479 Unspecified speech disturbances: Secondary | ICD-10-CM | POA: Diagnosis not present

## 2018-03-10 DIAGNOSIS — R062 Wheezing: Secondary | ICD-10-CM | POA: Diagnosis not present

## 2018-03-10 DIAGNOSIS — Z79899 Other long term (current) drug therapy: Secondary | ICD-10-CM | POA: Insufficient documentation

## 2018-03-10 DIAGNOSIS — N3000 Acute cystitis without hematuria: Secondary | ICD-10-CM | POA: Diagnosis not present

## 2018-03-10 DIAGNOSIS — R32 Unspecified urinary incontinence: Secondary | ICD-10-CM | POA: Diagnosis present

## 2018-03-10 DIAGNOSIS — J069 Acute upper respiratory infection, unspecified: Secondary | ICD-10-CM | POA: Insufficient documentation

## 2018-03-10 LAB — URINALYSIS, ROUTINE W REFLEX MICROSCOPIC
GLUCOSE, UA: NEGATIVE mg/dL
Hgb urine dipstick: NEGATIVE
Ketones, ur: 5 mg/dL — AB
Nitrite: NEGATIVE
PH: 5 (ref 5.0–8.0)
Protein, ur: 30 mg/dL — AB
Specific Gravity, Urine: 1.038 — ABNORMAL HIGH (ref 1.005–1.030)

## 2018-03-10 LAB — PREGNANCY, URINE: Preg Test, Ur: NEGATIVE

## 2018-03-10 MED ORDER — CEPHALEXIN 500 MG PO CAPS: ORAL_CAPSULE | ORAL | 0 refills | Status: DC

## 2018-03-10 MED ORDER — ONDANSETRON HCL 4 MG PO TABS: 4.0000 mg | ORAL_TABLET | Freq: Three times a day (TID) | ORAL | 0 refills | Status: DC | PRN

## 2018-03-10 MED ORDER — PREDNISONE 10 MG (21) PO TBPK: ORAL_TABLET | ORAL | 0 refills | Status: DC

## 2018-03-10 NOTE — ED Provider Notes (Signed)
[facility] EMERGENCY DEPARTMENT Provider Note   CSN: [id number] Arrival date & time: 03/10/18  0335     History   Chief Complaint Chief Complaint  Patient presents with  . Urinary Incontinence  . Nasal Congestion    HPI Lori Miles is a 22 y.o. female who is nonverbal and handicapped. The patient's mother gives the history.  She states that she has a history of recurrent urinary tract infections and URIs.  She was seen yesterday and given prednisone.  The mother states that she has had multiple episodes of wheezing in the past and that 40 mg prednisone never works for her and that she always uses 60 mg.  Patient has also had foul-smelling urine with incontinence which is very abnormal for her.  Patient's mother is very worried that she will have UTI and pneumonia.  Patient has had increased lethargy and been less active over the past 24 hours.  She comes for further evaluation.  HPI  Past Medical History:  Diagnosis Date  . Asthma   . Autism    mom states no actual dx, but admits she has some symptoms  . Bacteriuria   . Chronic constipation   . Cognitive developmental delay   . Disruptive behavior disorder   . Family history of adverse reaction to anesthesia    mother had itching after anesthesia one time  . Frequency-urgency syndrome   . Gait disorder    ORGANIC PER NERUOLGIST NOTE (DR HICKLING)  . 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 GERD (gastroesophageal reflux disease)   . Gout   . H/O sinus tachycardia   . History of acute respiratory failure 02/14/2015   SECONDARY TO CAP  . History of recurrent UTIs   . Interstitial cystitis   . Moderate intellectual disability   . OSA (obstructive sleep apnea)    no machine yet   . Partial epilepsy with impairment of consciousness (HCC)    FOLLOWED BY DR HICKLING  . Pneumonia 2018 last time  . PONV (postoperative nausea and vomiting)   . Seizure (HCC)    most recent sz  " at the end of summer"-last sz years ago per mother  . Urinary incontinence     Patient Active Problem List   Diagnosis Date Noted  . Dysuria 02/20/2018  . Allergic rhinitis 04/03/2017  . Recurrent falls 03/21/2017  . Weakness 03/21/2017  . Venous insufficiency 03/21/2017  . Excessive daytime sleepiness 03/05/2016  . 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 Cough variant asthma vs UACS/ vcd  03/15/2015  . Autism spectrum disorder 02/14/2015  . Nausea with vomiting 02/14/2015  . Obesity, morbid (HCC) 12/13/2014  . 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 Acanthosis nigricans 11/14/2014  . Toeing-in 08/29/2014  . 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 Recurrent urinary tract infection 08/01/2011  . Asthma 08/01/2011  . Chronic constipation 08/01/2011  . Contraception 08/01/2011    Past Surgical History:  Procedure Laterality Date  . CYSTOSCOPY N/A 06/03/2017   Procedure: CYSTOSCOPY WITH EXAM UNDER ANESTHESIA;  Surgeon: Jerilee Field, MD;  Location: [facility];  Service: Urology;  Laterality: N/A;  . CYSTOSCOPY WITH HYDRODISTENSION AND BIOPSY  04/14/2012   Procedure: CYSTOSCOPY/BIOPSY/HYDRODISTENSION;  Surgeon: Lindaann Slough, MD;  Location: [facility];  Service: Urology;;  instillation of marcaine and pyridium  . EUA/  PAP SMEAR/  BREAST EXAM  03-12-2016   dr Erin Fulling [facility]  . EUA/ VAGINOSCOPY/ COLPOSCOPY FO THE HYMENAL RING  10-04-2009    dr Tamela Oddi  [facility]  . TONSILLECTOMY       OB History   No obstetric history on file.      Home Medications    Prior to Admission medications   Medication Sig Start Date End Date Taking? Authorizing Provider  acetaminophen (TYLENOL) 325 MG tablet Take 2 tablets (650 mg  total) by mouth every 6 (six) hours as needed for mild pain or moderate pain. 02/27/17   Everlene Farrier, PA-C  albuterol (PROVENTIL) (2.5 MG/3ML) 0.083% nebulizer solution INHALE CONTENTS OF 1 VIAL IN NEBULIZER EVERY 4 TO 6 HOURS IF NEEDED FOR COUGH OR WHEEZING Patient taking differently: Take 2.5 mg by nebulization every 4 (four) hours as needed for wheezing.  07/07/17   Kozlow, Alvira Philips, MD  benzonatate (TESSALON) 100 MG capsule Take 2 capsules (200 mg total) by mouth 2 (two) times daily as needed for cough. 12/18/17   Bast, Traci A, NP  budesonide (PULMICORT) 0.5 MG/2ML nebulizer solution TAKE 4 MILLILITERS BY NEBULIZATION TWICE A DAY AS DIRECTED 01/11/18   [provider]  budesonide (PULMICORT) 1 MG/2ML nebulizer solution Take 2 mLs (1 mg total) by nebulization 2 (two) times daily. 11/12/17   Kozlow, Alvira Philips, MD  cetirizine (ZYRTEC) 10 MG tablet TAKE 1 TABLET BY MOUTH EVERY DAY FOR RUNNY NOSE OR ITCHING. NEED OFFICE VISIT FOR REFILLS Patient taking differently: Take 10 mg by mouth daily.  10/31/17   Kozlow, Alvira Philips, MD  cloNIDine (CATAPRES) 0.1 MG tablet TAKE 1 AND 1/2 TABLET BY MOUTH AT BEDTIME 02/20/18   Elveria Rising, NP  DEPAKOTE 500 MG DR tablet Give 1 tablet in the morning and 2 tablets at night 02/20/18   Elveria Rising, NP  Levonorgestrel-Ethinyl Estradiol (SEASONIQUE) 0.15-0.03 &0.01 MG tablet Take 1 tablet by mouth daily. 01/29/18   Sharyon Cable, CNM  Melatonin 3 MG TABS Take 3 mg by mouth at bedtime. For insomnia    [provider]  mometasone (NASONEX) 50 MCG/ACT nasal spray Place 2 sprays into the nose daily. 09/30/17   Kozlow, Alvira Philips, MD  montelukast (SINGULAIR) 10 MG tablet TAKE 1 TABLET BY MOUTH EVERY EVENING TO PREVENT COUGH OR WHEEZING NEEDS AN OFFICE VISIT FOR FURTHER REFILLS Patient taking differently: Take 10 mg by mouth daily.  10/31/17   Kozlow, Alvira Philips, MD  ondansetron (ZOFRAN ODT) 4 MG disintegrating tablet Take 1 tablet (4 mg total) by mouth every 6  (six) hours as needed. 12/04/17   Ward, Layla Maw, DO  ondansetron (ZOFRAN) 4 MG tablet Take 1 tablet (4 mg total) by mouth every 6 (six) hours. 01/30/18   Fayrene Helper, PA-C  PERFOROMIST 20 MCG/2ML nebulizer solution PLACE 2 MLS BY NEBULIZATION 2 TIMES DAILY 01/12/18   Kozlow, Alvira Philips, MD  polyethylene glycol powder (GLYCOLAX/MIRALAX) powder Dissolve 17 grasm in at least 8 ounces water/juice and drink by mouth twice daily 02/18/18   Pyrtle, Carie Caddy, MD  predniSONE (DELTASONE) 20 MG tablet Take 2 tablets (40 mg total) by mouth daily for 4 days. 03/08/18 03/12/18  Curatolo, Adam, DO  ranitidine (ZANTAC) 150 MG tablet Take 1 tablet (150 mg total) by mouth 2 (two) times daily. Pharmacy-please d/c rx for 300 mg dosing 11/12/17   Pyrtle, Carie Caddy, MD  senna (SENOKOT) 8.6 MG tablet Take 1 tablet by  mouth at bedtime.     [provider]  sulfamethoxazole-trimethoprim (BACTRIM DS,SEPTRA DS) 800-160 MG tablet Take 1 tablet by mouth 2 (two) times daily. 02/23/18   Myrlene Brokerrawford, Elizabeth A, MD  traZODone (DESYREL) 50 MG tablet Take 2 tablets (100 mg total) by mouth at bedtime. 02/20/18   Elveria RisingGoodpasture, Tina, NP    Family History Family History  Problem Relation Age of Onset  . Seizures Mother   . Seizures Sister        1 sister has  . Pancreatic cancer Sister   . Colon cancer Neg Hx   . Colon polyps Neg Hx   . Esophageal cancer Neg Hx   . Rectal cancer Neg Hx   . Stomach cancer Neg Hx     Social History Social History   Tobacco Use  . Smoking status: Never Smoker  . Smokeless tobacco: Never Used  Substance Use Topics  . Alcohol use: No    Alcohol/week: 0.0 standard drinks  . Drug use: No     Allergies   Abilify [aripiprazole]; Seroquel [quetiapine fumarate]; Doxycycline; Amoxicillin-pot clavulanate; and Keppra [levetiracetam]   Review of Systems Review of Systems  Unable to review systems as patient is nonverbal Physical Exam Updated Vital Signs BP 108/79 (BP Location: Right Arm)    Pulse 86   Temp 98.3 F (36.8 C) (Oral)   Resp 18   SpO2 98%   Physical Exam Vitals signs and nursing note reviewed.  Constitutional:      General: She is not in acute distress.    Appearance: Normal appearance. She is well-developed. She is obese. She is not diaphoretic.     Comments: Patient resting comfortably  HENT:     Head: Normocephalic and atraumatic.  Eyes:     General: No scleral icterus.    Conjunctiva/sclera: Conjunctivae normal.     Pupils: Pupils are equal, round, and reactive to light.  Neck:     Musculoskeletal: Normal range of motion.  Cardiovascular:     Rate and Rhythm: Normal rate and regular rhythm.     Heart sounds: Normal heart sounds. No murmur. No friction rub. No gallop.   Pulmonary:     Effort: Pulmonary effort is normal. No respiratory distress.     Breath sounds: Wheezing present.     Comments: Minimal expiratory wheeze Abdominal:     General: Bowel sounds are normal. There is no distension.     Palpations: Abdomen is soft. There is no mass.     Tenderness: There is no abdominal tenderness. There is no right CVA tenderness, left CVA tenderness or guarding.  Skin:    General: Skin is warm and dry.  Neurological:     Mental Status: She is alert and oriented to person, place, and time.  Psychiatric:        Behavior: Behavior normal.      ED Treatments / Results  Labs (all labs ordered are listed, but only abnormal results are displayed) Labs Reviewed  URINALYSIS, ROUTINE W REFLEX MICROSCOPIC - Abnormal; Notable for the following components:      Result Value   Color, Urine AMBER (*)    APPearance HAZY (*)    Specific Gravity, Urine 1.038 (*)    Bilirubin Urine SMALL (*)    Ketones, ur 5 (*)    Protein, ur 30 (*)    Leukocytes, UA TRACE (*)    Bacteria, UA RARE (*)    All other components within normal limits  PREGNANCY, URINE  EKG None  Radiology Dg Chest 2 View  Result Date: 03/08/2018 CLINICAL DATA:  Cough, vomiting,  upper respiratory symptoms EXAM: CHEST - 2 VIEW COMPARISON:  01/30/2018 FINDINGS: Low lung volumes with minor basilar streaky opacities compatible with atelectasis. Normal heart size and vascularity. No definite focal pneumonia, collapse or consolidation. Negative for edema, effusion or pneumothorax. Trachea is midline. Mild scoliosis of the spine. No acute osseous finding. IMPRESSION: Low volume exam with minor atelectasis. Electronically Signed   By: Judie PetitM.  Shick M.D.   On: 03/08/2018 08:58    Procedures Procedures (including critical care time)  Medications Ordered in ED Medications - No data to display   Initial Impression / Assessment and Plan / ED Course  I have reviewed the triage vital signs and the nursing notes.  Pertinent labs & imaging results that were available during my care of the patient were reviewed by me and considered in my medical decision making (see chart for details).     Patient with history of recurrent urinary tract infection and URI.  Patient will be given a prednisone taper to complete after her burst.  I have also ordered Keflex for UTI.  Patient appears appropriate for discharge with outpatient follow-up.  Discussed return precautions with the mother  Final Clinical Impressions(s) / ED Diagnoses   Final diagnoses:  Upper respiratory tract infection, unspecified type  Acute cystitis without hematuria    ED Discharge Orders    None       Arthor CaptainHarris, Sicilia Killough, PA-C 03/10/18 0847    Lorre NickAllen, Anthony, MD 03/11/18 [phone]

## 2018-03-10 NOTE — Discharge Instructions (Addendum)
When your have finished the steroid burst start the prednisone taper. Begin taking the antibiotics today.  Use tylenol and ibuprofen for pain relief. Zofran is for nausea should she have nausea from the antibiotics. Get help right away if you: Feel pain or pressure in your chest. Have shortness of breath. Faint or feel like you will faint. Have severe and persistent vomiting. Feel confused or disoriented.  Get help right away if you have: Severe pain in your back or your lower abdomen. A fever. Nausea or vomiting.

## 2018-03-10 NOTE — ED Triage Notes (Signed)
Mom concerned that pt has UTI.  Reports urinary incontinence and foul smelling urine x 2 weeks. Also reports nasal congestion and "sinus infection" since Sunday. States pt is taking Prednisone 40 mg and mom states pt needs to take 60 mg.

## 2018-03-10 NOTE — ED Notes (Signed)
No response for vitals x1 ?

## 2018-03-14 ENCOUNTER — Emergency Department (HOSPITAL_COMMUNITY)
Admission: EM | Admit: 2018-03-14 | Discharge: 2018-03-14 | Disposition: A | Discharge status: Home / Self Care | Payer: Medicare Other | Attending: Emergency Medicine | Admitting: Emergency Medicine

## 2018-03-14 ENCOUNTER — Emergency Department (HOSPITAL_COMMUNITY): Payer: Medicare Other

## 2018-03-14 DIAGNOSIS — J45909 Unspecified asthma, uncomplicated: Secondary | ICD-10-CM | POA: Diagnosis not present

## 2018-03-14 DIAGNOSIS — F840 Autistic disorder: Secondary | ICD-10-CM | POA: Diagnosis not present

## 2018-03-14 DIAGNOSIS — R0981 Nasal congestion: Secondary | ICD-10-CM | POA: Diagnosis present

## 2018-03-14 DIAGNOSIS — J321 Chronic frontal sinusitis: Secondary | ICD-10-CM

## 2018-03-14 DIAGNOSIS — J019 Acute sinusitis, unspecified: Secondary | ICD-10-CM | POA: Diagnosis not present

## 2018-03-14 DIAGNOSIS — R05 Cough: Secondary | ICD-10-CM | POA: Diagnosis not present

## 2018-03-14 LAB — CBC WITH DIFFERENTIAL/PLATELET
Abs Immature Granulocytes: 1.07 10*3/uL — ABNORMAL HIGH (ref 0.00–0.07)
BASOS ABS: 0.1 10*3/uL (ref 0.0–0.1)
Basophils Relative: 0 %
Eosinophils Absolute: 0 10*3/uL (ref 0.0–0.5)
Eosinophils Relative: 0 %
HCT: 39.3 % (ref 36.0–46.0)
Hemoglobin: 12.9 g/dL (ref 12.0–15.0)
IMMATURE GRANULOCYTES: 5 %
Lymphocytes Relative: 18 %
Lymphs Abs: 4 10*3/uL (ref 0.7–4.0)
MCH: 30.6 pg (ref 26.0–34.0)
MCHC: 32.8 g/dL (ref 30.0–36.0)
MCV: 93.1 fL (ref 80.0–100.0)
Monocytes Absolute: 1.7 10*3/uL — ABNORMAL HIGH (ref 0.1–1.0)
Monocytes Relative: 8 %
NRBC: 0 % (ref 0.0–0.2)
Neutro Abs: 15.9 10*3/uL — ABNORMAL HIGH (ref 1.7–7.7)
Neutrophils Relative %: 69 %
Platelets: 266 10*3/uL (ref 150–400)
RBC: 4.22 MIL/uL (ref 3.87–5.11)
RDW: 13.5 % (ref 11.5–15.5)
WBC: 22.8 10*3/uL — ABNORMAL HIGH (ref 4.0–10.5)

## 2018-03-14 LAB — URINALYSIS, ROUTINE W REFLEX MICROSCOPIC
Glucose, UA: 150 mg/dL — AB
Hgb urine dipstick: NEGATIVE
Ketones, ur: 5 mg/dL — AB
Leukocytes, UA: NEGATIVE
NITRITE: NEGATIVE
Protein, ur: NEGATIVE mg/dL
Specific Gravity, Urine: 1.043 — ABNORMAL HIGH (ref 1.005–1.030)
pH: 5 (ref 5.0–8.0)

## 2018-03-14 LAB — COMPREHENSIVE METABOLIC PANEL
ALT: 20 U/L (ref 0–44)
AST: 16 U/L (ref 15–41)
Albumin: 2.9 g/dL — ABNORMAL LOW (ref 3.5–5.0)
Alkaline Phosphatase: 40 U/L (ref 38–126)
Anion gap: 10 (ref 5–15)
BUN: 15 mg/dL (ref 6–20)
CO2: 25 mmol/L (ref 22–32)
Calcium: 8.7 mg/dL — ABNORMAL LOW (ref 8.9–10.3)
Chloride: 104 mmol/L (ref 98–111)
Creatinine, Ser: 0.83 mg/dL (ref 0.44–1.00)
GFR calc Af Amer: >60 mL/min (ref >60)
GFR calc non Af Amer: >60 mL/min (ref >60)
Glucose, Bld: 149 mg/dL — ABNORMAL HIGH (ref 70–99)
POTASSIUM: 4 mmol/L (ref 3.5–5.1)
SODIUM: 139 mmol/L (ref 135–145)
Total Bilirubin: 0.2 mg/dL — ABNORMAL LOW (ref 0.3–1.2)
Total Protein: 5.8 g/dL — ABNORMAL LOW (ref 6.5–8.1)

## 2018-03-14 LAB — INFLUENZA PANEL BY PCR (TYPE A & B)
INFLBPCR: NEGATIVE
Influenza A By PCR: NEGATIVE

## 2018-03-14 MED ORDER — LEVOFLOXACIN 750 MG PO TABS: 750.0000 mg | ORAL_TABLET | Freq: Every day | ORAL | 0 refills | Status: DC

## 2018-03-14 MED ORDER — LEVOFLOXACIN 750 MG PO TABS
750.0000 mg | ORAL_TABLET | Freq: Once | ORAL | Status: AC
  Administered 2018-03-14: 750 mg via ORAL
  Filled 2018-03-14: qty 1

## 2018-03-14 MED ORDER — IBUPROFEN 100 MG/5ML PO SUSP
800.0000 mg | Freq: Once | ORAL | Status: AC
  Administered 2018-03-14: 800 mg via ORAL
  Filled 2018-03-14: qty 40

## 2018-03-14 NOTE — ED Triage Notes (Signed)
[name] reports multiple complaints, mainly congestion and her daughter is "not herself."  Pt is not eating, sleeping or doing her daily activities per her mother.

## 2018-03-14 NOTE — ED Notes (Signed)
Patient transported to X-ray 

## 2018-03-14 NOTE — ED Provider Notes (Signed)
Emergency Department Provider Note   I have reviewed the triage vital signs and the nursing notes.   HISTORY  Chief Complaint multiple complaints and Nasal Congestion   HPI Lori Miles is a 22 y.o. female with history of some type of developmental delay the presents to the emergency department today secondary to worsening sinus drainage and ear pain.  Had a fever at home and somewhat decreased energy.  Patient has been seen in the emergency department multiple times over the last year for similar symptoms.  Mom states that the decreased energy seems to be worse than normal.  She is currently on Keflex for presumed urinary tract infection.  Has had some cough as well.  Patient not able to offer any history secondary to developmental delay. No other associated or modifying symptoms.    Past Medical History:  Diagnosis Date  . Asthma   . Autism    mom states no actual dx, but admits she has some symptoms  . Bacteriuria   . Chronic constipation   . Cognitive developmental delay   . Disruptive behavior disorder   . Family history of adverse reaction to anesthesia    mother had itching after anesthesia one time  . Frequency-urgency syndrome   . Gait disorder    ORGANIC PER NERUOLGIST NOTE (DR HICKLING)  . 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 GERD (gastroesophageal reflux disease)   . Gout   . H/O sinus tachycardia   . History of acute respiratory failure 02/14/2015   SECONDARY TO CAP  . History of recurrent UTIs   . Interstitial cystitis   . Moderate intellectual disability   . OSA (obstructive sleep apnea)    no machine yet   . Partial epilepsy with impairment of consciousness (HCC)    FOLLOWED BY DR HICKLING  . Pneumonia 2018 last time  . PONV (postoperative nausea and vomiting)   . Seizure (HCC)    most recent sz " at the end of summer"-last sz years ago per mother  . Urinary incontinence     Patient Active Problem List   Diagnosis Date Noted  . Dysuria 02/20/2018  . Allergic rhinitis 04/03/2017  . Recurrent falls 03/21/2017  . Weakness 03/21/2017  . Venous insufficiency 03/21/2017  . Excessive daytime sleepiness 03/05/2016  . 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 Cough variant asthma vs UACS/ vcd  03/15/2015  . Autism spectrum disorder 02/14/2015  . Nausea with vomiting 02/14/2015  . Obesity, morbid (HCC) 12/13/2014  . 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 Acanthosis nigricans 11/14/2014  . Toeing-in 08/29/2014  . 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 Recurrent urinary tract infection 08/01/2011  . Asthma 08/01/2011  . Chronic constipation 08/01/2011  . Contraception 08/01/2011    Past Surgical History:  Procedure Laterality Date  . CYSTOSCOPY N/A 06/03/2017   Procedure: CYSTOSCOPY WITH EXAM UNDER ANESTHESIA;  Surgeon: Jerilee FieldEskridge, [name], MD;  [facility];  Service: Urology;  Laterality: N/A;  . CYSTOSCOPY WITH HYDRODISTENSION AND BIOPSY  04/14/2012   Procedure: CYSTOSCOPY/BIOPSY/HYDRODISTENSION;  Surgeon: Lindaann SloughMarc-Henry Nesi, MD;  Location: [facility];  Service: Urology;;  instillation of marcaine and pyridium  . EUA/  PAP SMEAR/  BREAST EXAM  03-12-2016   [facility]  . EUA/ VAGINOSCOPY/ COLPOSCOPY FO THE  HYMENAL RING  10-04-2009    [facility]  . TONSILLECTOMY      Current Outpatient Rx  . Order #: [id number] Class: Print  . Order #: [id number] Class: Normal  . Order #: [id number] Class: Normal  . Order #: [id number] Class: Historical Med  . Order #: [id number] Class: Normal  . Order #: [id number] Class: Normal  . Order #: [id number] Class: Print  . Order #: [id number] Class: Normal  . Order #: [id number] Class: Historical  Med  . Order #: [id number] Class: Normal  . Order #: [id number] Class: Normal  . Order #: [id number] Class: Normal  . Order #: [id number] Class: Normal  . Order #: [id number] Class: Normal  . Order #: [id number] Class: Normal  . Order #: [id number] Class: Historical Med  . Order #: [id number] Class: Normal  . Order #: [id number] Class: Print    Allergies Abilify [aripiprazole]; Seroquel [quetiapine fumarate]; Doxycycline; Amoxicillin-pot clavulanate; and Keppra [levetiracetam]  Family History  Problem Relation Age of Onset  . Seizures Mother   . Seizures Sister        1 sister has  . Pancreatic cancer Sister   . Colon cancer Neg Hx   . Colon polyps Neg Hx   . Esophageal cancer Neg Hx   . Rectal cancer Neg Hx   . Stomach cancer Neg Hx     Social History Social History   Tobacco Use  . Smoking status: Never Smoker  . Smokeless tobacco: Never Used  Substance Use Topics  . Alcohol use: No    Alcohol/week: 0.0 standard drinks  . Drug use: No    Review of Systems  All other systems negative except as documented in the HPI. All pertinent positives and negatives as reviewed in the HPI. ____________________________________________   PHYSICAL EXAM:  VITAL SIGNS: ED Triage Vitals  Enc Vitals Group     BP 03/14/18 0630 111/77     Pulse Rate 03/14/18 0630 96     Resp 03/14/18 0630 18     Temp 03/14/18 0754 97.8 F (36.6 C)     Temp Source 03/14/18 0754 Oral     SpO2 03/14/18 0630 96 %   Constitutional: Alert and oriented. Well appearing and in no acute distress. Eyes: Conjunctivae are normal. PERRL. EOMI. Head: Atraumatic. Nose: boggy turbinates, mild rhinorrhea Mouth/Throat: Mucous membranes are moist.  Oropharynx non-erythematous. Neck: No stridor.  No meningeal signs.   Cardiovascular: Normal rate, regular rhythm. Good peripheral circulation. Grossly normal heart sounds.   Respiratory: Normal respiratory effort.  No retractions. Lungs CTAB. Gastrointestinal: Soft and nontender.  No distention.  Musculoskeletal: No lower extremity tenderness nor edema. No gross deformities of extremities. Neurologic:  Normal speech and language. No gross focal neurologic deficits are appreciated.  Skin:  Skin is warm, dry and intact. No rash noted.  ____________________________________________   LABS (all labs ordered are listed, but only abnormal results are displayed)  Labs Reviewed  COMPREHENSIVE METABOLIC PANEL - Abnormal; Notable for the following components:      Result Value   Glucose, Bld 149 (*)    Calcium 8.7 (*)    Total Protein 5.8 (*)    Albumin 2.9 (*)    Total Bilirubin 0.2 (*)    All other components within normal limits  CBC WITH DIFFERENTIAL/PLATELET - Abnormal; Notable for the following components:   WBC 22.8 (*)    Neutro Abs 15.9 (*)    Monocytes Absolute 1.7 (*)    Abs Immature Granulocytes 1.07 (*)    All other components within normal limits  URINALYSIS, ROUTINE W REFLEX MICROSCOPIC -  Abnormal; Notable for the following components:   Specific Gravity, Urine 1.043 (*)    Glucose, UA 150 (*)    Bilirubin Urine SMALL (*)    Ketones, ur 5 (*)    All other components within normal limits  INFLUENZA PANEL BY PCR (TYPE A & B)   ____________________________________________  RADIOLOGY  Dg Chest 2 View  Result Date: 03/14/2018 CLINICAL DATA:  Cough. EXAM: CHEST - 2 VIEW COMPARISON:  03/08/2018, 01/30/2018 and 12/18/2017 FINDINGS: The heart size and mediastinal contours are within normal limits. Both lungs are clear. The visualized skeletal structures are unremarkable. IMPRESSION: No active cardiopulmonary disease. Electronically Signed   By: Francene Boyers M.D.   On: 03/14/2018 08:58    ____________________________________________   PROCEDURES  Procedure(s) performed:   Procedures   ____________________________________________   INITIAL IMPRESSION / ASSESSMENT AND PLAN / ED COURSE  Likely sinusitis. Will add abx to cover sinuses. Needs  closer pcp/specialist follow up.  Pertinent labs & imaging results that were available during my care of the patient were reviewed by me and considered in my medical decision making (see chart for details).  ____________________________________________  FINAL CLINICAL IMPRESSION(S) / ED DIAGNOSES  Final diagnoses:  Frontal sinusitis, unspecified chronicity     MEDICATIONS GIVEN DURING THIS VISIT:  Medications  ibuprofen (ADVIL,MOTRIN) 100 MG/5ML suspension 800 mg (800 mg Oral Given 03/14/18 0933)  levofloxacin (LEVAQUIN) tablet 750 mg (750 mg Oral Given 03/14/18 1212)     NEW OUTPATIENT MEDICATIONS STARTED DURING THIS VISIT:  Discharge Medication List as of 03/14/2018 11:51 AM    START taking these medications   Details  levofloxacin (LEVAQUIN) 750 MG tablet Take 1 tablet (750 mg total) by mouth daily. X 7 days, Starting Sat 03/14/2018, Print        Note:  This note was prepared with assistance of Dragon voice recognition software. Occasional wrong-word or sound-a-like substitutions may have occurred due to the inherent limitations of voice recognition software.   Marily Memos, MD 03/14/18 [phone]

## 2018-03-14 NOTE — ED Notes (Signed)
Unable to get temp at this time. °

## 2018-03-14 NOTE — ED Notes (Signed)
Got patient undress on the monitor patient is resting with call bell in reach and family at bedside 

## 2018-03-16 ENCOUNTER — Other Ambulatory Visit: Payer: Self-pay | Admitting: *Deleted

## 2018-03-16 ENCOUNTER — Telehealth (INDEPENDENT_AMBULATORY_CARE_PROVIDER_SITE_OTHER): Payer: Self-pay | Admitting: Family

## 2018-03-16 ENCOUNTER — Other Ambulatory Visit (INDEPENDENT_AMBULATORY_CARE_PROVIDER_SITE_OTHER): Payer: Self-pay | Admitting: Family

## 2018-03-16 DIAGNOSIS — G40309 Generalized idiopathic epilepsy and epileptic syndromes, not intractable, without status epilepticus: Secondary | ICD-10-CM

## 2018-03-16 DIAGNOSIS — G40209 Localization-related (focal) (partial) symptomatic epilepsy and epileptic syndromes with complex partial seizures, not intractable, without status epilepticus: Secondary | ICD-10-CM

## 2018-03-16 MED ORDER — CETIRIZINE HCL 10 MG PO TABS: 10.0000 mg | ORAL_TABLET | Freq: Every day | ORAL | 3 refills | Status: DC

## 2018-03-16 MED ORDER — MONTELUKAST SODIUM 10 MG PO TABS: 10.0000 mg | ORAL_TABLET | Freq: Every day | ORAL | 3 refills | Status: DC

## 2018-03-16 MED ORDER — BUDESONIDE 1 MG/2ML IN SUSP: 1.0000 mg | Freq: Two times a day (BID) | RESPIRATORY_TRACT | 0 refills | Status: DC

## 2018-03-16 MED ORDER — FORMOTEROL FUMARATE 20 MCG/2ML IN NEBU: 20.0000 ug | INHALATION_SOLUTION | Freq: Two times a day (BID) | RESPIRATORY_TRACT | 2 refills | Status: DC

## 2018-03-16 NOTE — Telephone Encounter (Signed)
Have her seen today in clinic 

## 2018-03-16 NOTE — Telephone Encounter (Signed)
She had to go back to the ER on Saturday and they started Levaquin for a sinus infection and ear infection. She is still really congested and having to use Albuterol twice daily. Is there anything else that we can give her to help? Patient is also using all medications as prescribed.    Apologized to mom for the error and delay in returning her call.

## 2018-03-16 NOTE — Telephone Encounter (Signed)
Patient does not have transportation and mother does not have funds to get here

## 2018-03-16 NOTE — Telephone Encounter (Signed)
°  Who's calling (name and relationship to patient) : Lori CzechAcquanetta Miles, mom  Best contact number: [phone]  Provider they see: Lori Miles  Reason for call: Mom states she called the pharmacy and they said they did not receive the prescription, but she know's Lori Miles sent is so is thinking the pharmacy lost it, and would like Lori Fermoina to follow up on this and call her back when she can.     PRESCRIPTION REFILL ONLY  Name of prescription:  Pharmacy:

## 2018-03-16 NOTE — Telephone Encounter (Signed)
I have been out of the office for the past 10 days. Please check on status of patient.

## 2018-03-16 NOTE — Telephone Encounter (Signed)
Please inform mom to use the treatment prescribed by the ER and see us when transportation can be arranged.

## 2018-03-16 NOTE — Telephone Encounter (Signed)
Rx has been faxed to the pharmacy. The prescription had not yet been signed when mom called. The pharmacy should have it

## 2018-03-16 NOTE — Telephone Encounter (Signed)
Please send this medication to the pharmacy

## 2018-03-16 NOTE — Telephone Encounter (Signed)
Scheduled appointment for tomorrow. Mom will try to bring patient but they are short on funds and gas. Will call us if unable to bring patient.

## 2018-03-17 ENCOUNTER — Ambulatory Visit (INDEPENDENT_AMBULATORY_CARE_PROVIDER_SITE_OTHER): Payer: Medicare Other | Admitting: Allergy and Immunology

## 2018-03-17 ENCOUNTER — Encounter: Payer: Self-pay | Admitting: Allergy and Immunology

## 2018-03-17 VITALS — BP 122/68 | HR 97 | Temp 98.6°F | Resp 18

## 2018-03-17 DIAGNOSIS — J4540 Moderate persistent asthma, uncomplicated: Secondary | ICD-10-CM

## 2018-03-17 DIAGNOSIS — J449 Chronic obstructive pulmonary disease, unspecified: Secondary | ICD-10-CM | POA: Diagnosis not present

## 2018-03-17 DIAGNOSIS — K219 Gastro-esophageal reflux disease without esophagitis: Secondary | ICD-10-CM | POA: Diagnosis not present

## 2018-03-17 DIAGNOSIS — J3089 Other allergic rhinitis: Secondary | ICD-10-CM | POA: Diagnosis not present

## 2018-03-17 DIAGNOSIS — B999 Unspecified infectious disease: Secondary | ICD-10-CM

## 2018-03-17 MED ORDER — MOMETASONE FUROATE 50 MCG/ACT NA SUSP: 2.0000 | Freq: Every day | NASAL | 5 refills | Status: DC

## 2018-03-17 MED ORDER — ALBUTEROL SULFATE (2.5 MG/3ML) 0.083% IN NEBU: INHALATION_SOLUTION | RESPIRATORY_TRACT | 5 refills | Status: DC

## 2018-03-17 MED ORDER — RANITIDINE HCL 150 MG PO TABS: 150.0000 mg | ORAL_TABLET | Freq: Two times a day (BID) | ORAL | 4 refills | Status: DC

## 2018-03-17 NOTE — Progress Notes (Addendum)
Follow-up Note  Referring Provider: Myrlene Broker, * Primary Provider: Myrlene Broker, MD Date of Office Visit: 03/17/2018  Subjective:   Lori Miles (DOB: 16-Jan-1996) is a 22 y.o. female [facility] in re-evaluation of the following:  HPI: Lori Miles returns to this clinic in reevaluation of asthma and allergic rhinoconjunctivitis and reflux induced respiratory disease.  I last saw her in this clinic on 30 September 2017.  At that point in time she had really had an excellent interval of time of approximately 8 months without any significant respiratory tract issues or issues involved with reflux.  According to her mom she has had a terrible November and December regarding her respiratory tract as well as issues with urinary tract infections.  Apparently she has been in the emergency room 6 or 8 times in November and December for issues related to urinary tract infections and issues with nasal congestion and coughing and has been treated with approximately 8 antibiotics with most recent administration including Levaquin and prednisone during her last emergency room evaluation of 14 March 2018.  She is better from this therapy but her mom thinks that overall she has had lots of problems with congestion of her nose and lots of cough and apparently she has been given the diagnosis of sinusitis and otitis media and pneumonia over the course of the past month..  There does not appear to be a significant issue with reflux at this point.  She has been seen in this clinic for a while and we have had her undergo evaluation for her immune deficiency but all evaluation to date has not really identified a significant defect in her immunological responsiveness.  She has been evaluated for possible biological agent administration but she has never really demonstrated significant eosinophilia or elevation in her IgE level.  Because of an issue  involved with cognitive function we have been giving her nebulized forms of anti-inflammatory agents for her airway instead of MDI formulations.  Allergies as of 03/17/2018      Reactions   Abilify [aripiprazole] Swelling, Palpitations   Seroquel [quetiapine Fumarate] Palpitations   Bactrim [sulfamethoxazole-trimethoprim] Other (See Comments)   Abdominal pain   Doxycycline Other (See Comments)   Stomach cramps   Amoxicillin-pot Clavulanate Hives   Has patient had a PCN reaction causing immediate rash, facial/tongue/throat swelling, SOB or lightheadedness with hypotension: No Has patient had a PCN reaction causing severe rash involving mucus membranes or skin necrosis: No Has patient had a PCN reaction that required hospitalization: No Has patient had a PCN reaction occurring within the last 10 years: No If all of the above answers are "NO", then may proceed with Cephalosporin use.   Keppra [levetiracetam] Other (See Comments)   Aggressive behavior       Medication List      acetaminophen 325 MG tablet Commonly known as:  TYLENOL Take 2 tablets (650 mg total) by mouth every 6 (six) hours as needed for mild pain or moderate pain.   albuterol (2.5 MG/3ML) 0.083% nebulizer solution Commonly known as:  PROVENTIL INHALE CONTENTS OF 1 VIAL IN NEBULIZER EVERY 4 TO 6 HOURS IF NEEDED FOR COUGH OR WHEEZING   benzonatate 100 MG capsule Commonly known as:  TESSALON Take 2 capsules (200 mg total) by mouth 2 (two) times daily as needed for cough.   budesonide 1 MG/2ML nebulizer solution Commonly known as:  PULMICORT Take 2 mLs (1 mg total) by  nebulization 2 (two) times daily.   cetirizine 10 MG tablet Commonly known as:  ZYRTEC Take 1 tablet (10 mg total) by mouth daily.   cloNIDine 0.1 MG tablet Commonly known as:  CATAPRES TAKE 1 AND 1/2 TABLET BY MOUTH AT BEDTIME   DEPAKOTE 500 MG DR tablet Generic drug:  divalproex TAKE 1 TABLET BY MOUTH IN THE MORNING AND 2 AT NIGHT     formoterol 20 MCG/2ML nebulizer solution Commonly known as:  PERFOROMIST Take 2 mLs (20 mcg total) by nebulization 2 (two) times daily.   levofloxacin 750 MG tablet Commonly known as:  LEVAQUIN Take 1 tablet (750 mg total) by mouth daily. X 7 days   Levonorgestrel-Ethinyl Estradiol 0.15-0.03 &0.01 MG tablet Commonly known as:  SEASONIQUE Take 1 tablet by mouth daily.   Melatonin 3 MG Tabs Take 3 mg by mouth at bedtime as needed (for sleep).   mometasone 50 MCG/ACT nasal spray Commonly known as:  NASONEX Place 2 sprays into the nose daily.   montelukast 10 MG tablet Commonly known as:  SINGULAIR Take 1 tablet (10 mg total) by mouth at bedtime.   ondansetron 4 MG disintegrating tablet Commonly known as:  ZOFRAN ODT Take 1 tablet (4 mg total) by mouth every 6 (six) hours as needed.   polyethylene glycol powder powder Commonly known as:  GLYCOLAX/MIRALAX Dissolve 17 grasm in at least 8 ounces water/juice and drink by mouth twice daily   ranitidine 150 MG tablet Commonly known as:  ZANTAC Take 1 tablet (150 mg total) by mouth 2 (two) times daily. Pharmacy-please d/c rx for 300 mg dosing   senna 8.6 MG tablet Commonly known as:  SENOKOT Take 1 tablet by mouth at bedtime.   traZODone 50 MG tablet Commonly known as:  DESYREL Take 2 tablets (100 mg total) by mouth at bedtime.   YUPELRI 175 MCG/3ML Soln Generic drug:  Revefenacin Inhale into the lungs.       Past Medical History:  Diagnosis Date  . Asthma   . Autism    mom states no actual dx, but admits she has some symptoms  . Bacteriuria   . Chronic constipation   . Cognitive developmental delay   . Disruptive behavior disorder   . Family history of adverse reaction to anesthesia    mother had itching after anesthesia one time  . Frequency-urgency syndrome   . Gait disorder    ORGANIC PER NERUOLGIST NOTE (DR HICKLING)  . 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 GERD  (gastroesophageal reflux disease)   . Gout   . H/O sinus tachycardia   . History of acute respiratory failure 02/14/2015   SECONDARY TO CAP  . History of recurrent UTIs   . Interstitial cystitis   . Moderate intellectual disability   . OSA (obstructive sleep apnea)    no machine yet   . Partial epilepsy with impairment of consciousness (HCC)    FOLLOWED BY DR HICKLING  . Pneumonia 2018 last time  . PONV (postoperative nausea and vomiting)   . Seizure (HCC)    most recent sz " at the end of summer"-last sz years ago per mother  . Urinary incontinence     Past Surgical History:  Procedure Laterality Date  . CYSTOSCOPY N/A 06/03/2017   Procedure: CYSTOSCOPY WITH EXAM UNDER ANESTHESIA;  Surgeon: Jerilee Field, MD;  Location: [facility];  Service: Urology;  Laterality: N/A;  . CYSTOSCOPY WITH HYDRODISTENSION AND BIOPSY  04/14/2012   Procedure:  CYSTOSCOPY/BIOPSY/HYDRODISTENSION;  Surgeon: Lindaann SloughMarc-Henry Nesi, MD;  [facility] ;  Service: Urology;;  instillation of marcaine and pyridium  . EUA/  PAP SMEAR/  BREAST EXAM  03-12-2016   [facility]  . EUA/ VAGINOSCOPY/ COLPOSCOPY FO THE HYMENAL RING  10-04-2009    dr Tam[facility]  . TONSILLECTOMY      Review of systems negative except as noted in HPI / PMHx or noted below:  Review of Systems  Constitutional: Negative.   HENT: Negative.   Eyes: Negative.   Respiratory: Negative.   Cardiovascular: Negative.   Gastrointestinal: Negative.   Genitourinary: Negative.   Musculoskeletal: Negative.   Skin: Negative.   Neurological: Negative.   Endo/Heme/Allergies: Negative.   Psychiatric/Behavioral: Negative.      Objective:   Vitals:   03/17/18 1331  BP: 122/68  Pulse: 97  Resp: 18  Temp: 98.6 F (37 C)  SpO2: 98%          Physical Exam Constitutional:      Appearance: She is not diaphoretic.  HENT:     Head: Normocephalic.     Right Ear: Tympanic membrane, ear canal  and external ear normal.     Left Ear: Tympanic membrane, ear canal and external ear normal.     Nose: Nose normal. No mucosal edema or rhinorrhea.     Mouth/Throat:     Pharynx: Uvula midline. No oropharyngeal exudate.  Eyes:     Conjunctiva/sclera: Conjunctivae normal.  Neck:     Thyroid: No thyromegaly.     Trachea: Trachea normal. No tracheal tenderness or tracheal deviation.  Cardiovascular:     Rate and Rhythm: Normal rate and regular rhythm.     Heart sounds: Normal heart sounds, S1 normal and S2 normal. No murmur.  Pulmonary:     Effort: No respiratory distress.     Breath sounds: Normal breath sounds. No stridor. No wheezing or rales.  Lymphadenopathy:     Head:     Right side of head: No tonsillar adenopathy.     Left side of head: No tonsillar adenopathy.     Cervical: No cervical adenopathy.  Skin:    Findings: No erythema or rash.     Nails: There is no clubbing.      Diagnostics:   Results of a chest x-ray obtained 08 March 2018 identified the following:   Low lung volumes with minor basilar streaky opacities compatible with atelectasis. Normal heart size and vascularity. No definite focal pneumonia, collapse or consolidation. Negative for edema, effusion or pneumothorax. Trachea is midline. Mild scoliosis of the spine. No acute osseous finding.  Results of a chest x-ray obtained 14 March 2018 identified the following:  The heart size and mediastinal contours are within normal limits. Both lungs are clear. The visualized skeletal structures are Unremarkable.  Results of a sinus CT scan obtained 31 October 2015 identified the following:  Trace bilateral ethmoid anterior and posterior air cell mucosal thickening. Subcentimeter bilateral maxillary mucosal retention cysts present on prior examination. No antral expansion, bony wall thickening. No destructive bony lesions.  Nasal septum slightly deviated to the LEFT. Bilateral ostiomeatal units,  frontoethmoidal and sphenoid ethmoidal recesses are widely Patent.  Results of blood tests obtained 08 March 2018 identified WBC 9.4, absolute eosinophil 0, absolute lymphocyte 2600, hemoglobin 11.5, platelet 220.  Results of blood tests obtained 14 March 2018 identified WBC 22.8, absolute eosinophil 0, absolute lymphocyte 4000, hemoglobin 12.9, platelet 266.  Results of blood tests obtained 31 Jul 2016 identified no IgE antibodies directed against a panel of common aeroallergens, serum IgE 7 IU/mL, IgG 753 mg/DL, IgA 72 mg/DL, IgM 90 mg/DL, normal B-cell memory panel.  Assessment and Plan:   1. Not well controlled moderate persistent asthma   2. COPD with asthma (HCC)   3. Other allergic rhinitis   4. Gastroesophageal reflux disease, esophagitis presence not specified   5. Recurrent infections     1. Continue a combination of the following:   A. budesonide 1 mg nebulized twice a day  B. Perforomist nebulized twice a day  C. Yupelri nebulized daily  C. Nasonex one spray daily  D. montelukast 10 mg daily  E. cetirizine 10 mg daily  2. Continue to Treat reflux with a combination of the following:   A. ranitidine 300 mg twice a day  B. No caffeine or chocolate consumption  3. Continue albuterol nebulization every 4-6 hours if needed  4. "Action plan" for asthma flare up:   A. increase budesonide to 4 times a day  B. continue Perforomist twice a day  C. use albuterol if needed  5. Finish current prescription for Levaquin and prednisone  6. Return to clinic in 4 weeks or earlier if problem  At this point I will assume that once Nancyjo finishes her very broad-spectrum antibiotic with the use of Levaquin and her current dose of systemic steroids and consistently uses anti-inflammatory agents for her airway as noted above she will do relatively well over the course of this upcoming 4 weeks.  At that point in time I will see her back in this clinic to make a decision about  further evaluation or treatment based upon her response.  Laurette SchimkeEric Kozlow, MD Allergy / Immunology [facility]

## 2018-03-17 NOTE — Patient Instructions (Addendum)
  1. Continue a combination of the following:   A. budesonide 1 mg nebulized twice a day  B. Perforomist nebulized twice a day  C. Yupelri nebulized daily  C. Nasonex one spray daily  D. montelukast 10 mg daily  E. cetirizine 10 mg daily  2. Continue to Treat reflux with a combination of the following:   A. ranitidine 300 mg twice a day  B. No caffeine or chocolate consumption  3. Continue albuterol nebulization every 4-6 hours if needed  4. "Action plan" for asthma flare up:   A. increase budesonide to 4 times a day  B. continue Perforomist twice a day  C. use albuterol if needed  5. Finish current prescription for Levaquin and prednisone  6. Return to clinic in 4 weeks or earlier if problem

## 2018-03-18 HISTORY — PX: CYSTOSCOPY: SUR368

## 2018-03-19 ENCOUNTER — Telehealth: Payer: Self-pay

## 2018-03-19 ENCOUNTER — Telehealth (INDEPENDENT_AMBULATORY_CARE_PROVIDER_SITE_OTHER): Payer: Self-pay | Admitting: Family

## 2018-03-19 ENCOUNTER — Telehealth: Payer: Self-pay | Admitting: *Deleted

## 2018-03-19 ENCOUNTER — Encounter: Payer: Self-pay | Admitting: Allergy and Immunology

## 2018-03-19 ENCOUNTER — Telehealth: Payer: Self-pay | Admitting: Pulmonary Disease

## 2018-03-19 ENCOUNTER — Telehealth: Payer: Self-pay | Admitting: Allergy and Immunology

## 2018-03-19 DIAGNOSIS — J45909 Unspecified asthma, uncomplicated: Secondary | ICD-10-CM | POA: Diagnosis not present

## 2018-03-19 DIAGNOSIS — G4719 Other hypersomnia: Secondary | ICD-10-CM

## 2018-03-19 MED ORDER — REVEFENACIN 175 MCG/3ML IN SOLN: 1.0000 | Freq: Every day | RESPIRATORY_TRACT | 2 refills | Status: DC

## 2018-03-19 MED ORDER — FAMOTIDINE 20 MG PO TABS: 20.0000 mg | ORAL_TABLET | Freq: Two times a day (BID) | ORAL | 3 refills | Status: DC

## 2018-03-19 NOTE — Telephone Encounter (Signed)
Spoke with pt's mother, [name]. She is wanting Dr. Craige Cotta to order an ONO for the pt. [facility] advised her to ask to have this done since the pt doesn't qualify for a CPAP. [name] wanted Dr. Craige Cotta to address this message.  Dr. Craige Cotta - please advise. Thanks.

## 2018-03-19 NOTE — Telephone Encounter (Signed)
Left message requesting  call back.

## 2018-03-19 NOTE — Telephone Encounter (Signed)
Mom called and needs to talk with a nurse [phone].

## 2018-03-19 NOTE — Telephone Encounter (Signed)
Prescription for Yupelri sent to [facility]. [phone]. I called to follow up and was advised that [facility] has not received the prescription from [facility] just yet. She told me to wait until after 1 pm and call back to see what the status is.

## 2018-03-19 NOTE — Telephone Encounter (Signed)
°  Who's calling (name and relationship to patient) : [name] Beddow - Mom   Best contact number: [phone]  Provider they see: Elveria Rising   Reason for call: Mom called in stating that Lori Miles is having a really hard time walking. Her left leg and entire left side of her body is very weak. She has emailed Inetta Fermo but I did advise she is out of the office at this time. Mom would like to speak with Inetta Fermo or her Medical assistant as soon as possible for some advice.

## 2018-03-19 NOTE — Telephone Encounter (Signed)
Mom called back to inform us that [facility] had reached out regarding the Yupelri and they would be getting it issued to patient. I do still want to follow up with [facility] to see how long it will be before the patient receives the medication.

## 2018-03-19 NOTE — Telephone Encounter (Signed)
Spoke with mom about her phone message. She requested to speak with Lori Miles when she returns on Monday

## 2018-03-19 NOTE — Addendum Note (Signed)
Addended by: Mliss Fritz I on: 03/19/2018 08:12 AM   Modules accepted: Orders

## 2018-03-19 NOTE — Telephone Encounter (Signed)
See other telephone note.  

## 2018-03-19 NOTE — Telephone Encounter (Signed)
Patient's mother requests that patient be changed from ranitidine to famotidine due to recent studies showing possible carcinogens in ranitidine. Per Dr Rhea BeltonPyrtle verbal orders, patient may change from ranitidine 150 mg twice daily to famotidine 20 mg every 12 hours. Rx sent to pharmacy.

## 2018-03-20 NOTE — Telephone Encounter (Signed)
Okay to send order for ONO on room air.

## 2018-03-20 NOTE — Telephone Encounter (Signed)
Spoke with pt's mother. She is aware of Dr. Evlyn Courier response. Order has been placed for ONO on room air. Nothing further was needed.

## 2018-03-20 NOTE — Telephone Encounter (Signed)
Message previously routed to Dr. Craige Cotta.  Dr. Craige Cotta returns to office 03/23/18.

## 2018-03-21 DIAGNOSIS — G809 Cerebral palsy, unspecified: Secondary | ICD-10-CM | POA: Diagnosis not present

## 2018-03-21 DIAGNOSIS — G4733 Obstructive sleep apnea (adult) (pediatric): Secondary | ICD-10-CM | POA: Diagnosis not present

## 2018-03-23 ENCOUNTER — Telehealth (INDEPENDENT_AMBULATORY_CARE_PROVIDER_SITE_OTHER): Payer: Self-pay | Admitting: Family

## 2018-03-23 ENCOUNTER — Telehealth: Payer: Self-pay | Admitting: Internal Medicine

## 2018-03-23 NOTE — Telephone Encounter (Signed)
Noted, thank you

## 2018-03-23 NOTE — Telephone Encounter (Signed)
[name] has questions concerning change in med from ranitidine to famotidine.  Please CB

## 2018-03-23 NOTE — Telephone Encounter (Signed)
Spoke with mom to get more information about her phone message. She stated that she can come up here on Thursday morning.

## 2018-03-23 NOTE — Telephone Encounter (Signed)
I will be seeing the patient tomorrow. TG

## 2018-03-23 NOTE — Telephone Encounter (Signed)
I called and talked to Mom. She said that [facility] had several back to back infections in December and received 3 antibiotics 3 times. Mom says that the infections seem to be gone but that since last week, Lidya won't bear weight on her left leg and has trouble using her left arm. Mom said that she requires assistance to walk and to eat. Mom has also noted that the leg shakes at times and she has video of that. Mom says that [facility] occasionally says that the left arm and leg hurts. Mom is very worried that Minal has had a stroke or has MS, and wants an MRI of the brain performed to evaluate why she is having trouble walking. I told Mom that I would review her chart, talk with Dr Sharene Skeans and call her back. I asked her to email the video to me and if that doesn't work, we can arrange for her to bring the video in for me to view. Mom agreed with this plan. TG

## 2018-03-23 NOTE — Telephone Encounter (Signed)
I called Mom and asked her to bring Lori Miles in at 12noon tomorrow. She will have to arrange a ride but otherwise agreed with this plan. TG

## 2018-03-23 NOTE — Telephone Encounter (Signed)
I discussed this patient with Dr Sharene Skeans. I will see [facility] tomorrow and will consult with Dr Sharene Skeans when she is in the office. TG

## 2018-03-23 NOTE — Telephone Encounter (Signed)
Patient's mother wondered if every 12 hours was the same as twice daily. Advised that it is.

## 2018-03-23 NOTE — Telephone Encounter (Signed)
°  Who's calling (name and relationship to patient) : Verna Czech, mom  Best contact number: [phone]  Provider they see: Elveria Rising  Reason for call: Mom says for Lori Miles to return her call, no other message.   PRESCRIPTION REFILL ONLY  Name of prescription:  Pharmacy:

## 2018-03-24 ENCOUNTER — Ambulatory Visit (INDEPENDENT_AMBULATORY_CARE_PROVIDER_SITE_OTHER): Payer: Medicare Other | Admitting: Family

## 2018-03-24 ENCOUNTER — Encounter (INDEPENDENT_AMBULATORY_CARE_PROVIDER_SITE_OTHER): Payer: Self-pay | Admitting: Family

## 2018-03-24 VITALS — HR 88 | Ht <= 58 in | Wt 184.8 lb

## 2018-03-24 DIAGNOSIS — M205X9 Other deformities of toe(s) (acquired), unspecified foot: Secondary | ICD-10-CM

## 2018-03-24 DIAGNOSIS — F819 Developmental disorder of scholastic skills, unspecified: Secondary | ICD-10-CM | POA: Diagnosis not present

## 2018-03-24 DIAGNOSIS — R296 Repeated falls: Secondary | ICD-10-CM

## 2018-03-24 DIAGNOSIS — R531 Weakness: Secondary | ICD-10-CM

## 2018-03-24 DIAGNOSIS — R5381 Other malaise: Secondary | ICD-10-CM

## 2018-03-24 DIAGNOSIS — R251 Tremor, unspecified: Secondary | ICD-10-CM

## 2018-03-24 NOTE — Telephone Encounter (Signed)
Per [facility] representative patient has received medication.

## 2018-03-24 NOTE — Progress Notes (Signed)
Patient: Lori Miles MRN: [id number] Sex: female DOB: 02-13-1996  Provider: Elveria Rising, NP Location of Care: [facility]  Note type: Routine return visit  History of Present Illness: Referral Source: Fleet Contras, MD History from: mother, patient and [facility] chart Chief Complaint: Epilepsy  Lori Miles is a 23 y.o. woman with history of generalized convulsive and partial epilepsy, intellectual disabilities, and problems with sleep. She was last seen February 19, 2018. Lori Miles is seen today because Mom contacted me with concerns about Lori Miles's left arm and leg shaking and needing assistance to walk. Family members have told Mom that she has a "nerve problem in her left side", and Mom has googled symptoms as well. Mom is very worried that Lori Miles has MS or MD, or has had a stroke. Mom says that Lori Miles has frequent shaking of the left leg and arm. Mom said that she recently spilled food due to the shaking. Mom read online that urinary incontinence is a symptom of MS and is very worried that Lori Miles has MS.   Lori Miles fatigues easily and tends to sit down when she is tired. She has reduced stamina and prefers to lie down rather than be active. She tells me that [facility] had 3 infections in December, a UTI, and 2 respiratory infections, and Mom feels that her left sided symptoms worsened with these illnesses. [name] regarded her mother carefully while Mom was relaying her concerns and occasionally pointed to herself as Mom talked. Mom wants an MRI of the brain done to evaluate her condition, wants lab studies done and/or wants her admitted for work up of her problems.   Mom has no other health concerns for Lori Miles today other than previously mentioned.  Review of Systems: Please see the HPI for neurologic and other pertinent review of systems. Otherwise, all other systems were reviewed and were negative.    Past Medical History:  Diagnosis Date  . Asthma   .  Autism    mom states no actual dx, but admits she has some symptoms  . Bacteriuria   . Chronic constipation   . Cognitive developmental delay   . Disruptive behavior disorder   . Family history of adverse reaction to anesthesia    mother had itching after anesthesia one time  . Frequency-urgency syndrome   . Gait disorder    ORGANIC PER NERUOLGIST NOTE (DR HICKLING)  . 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 GERD (gastroesophageal reflux disease)   . Gout   . H/O sinus tachycardia   . History of acute respiratory failure 02/14/2015   SECONDARY TO CAP  . History of recurrent UTIs   . Interstitial cystitis   . Moderate intellectual disability   . OSA (obstructive sleep apnea)    no machine yet   . Partial epilepsy with impairment of consciousness (HCC)    FOLLOWED BY DR HICKLING  . Pneumonia 2018 last time  . PONV (postoperative nausea and vomiting)   . Seizure (HCC)    most recent sz " at the end of summer"-last sz years ago per mother  . Urinary incontinence    Hospitalizations: No., Head Injury: No., Nervous System Infections: No., Immunizations up to date: Yes.   Past Medical History Comments: see HPI Copied from previous record: EEG on September 14, 2011, was normal. She was able to be successfully weaned off Keppra. The patient has significant issues with aggression, which was thought to be related to Keppra, but has persisted once  it was discontinued. The patient was treated with Depakote both for behavior and possible seizures. She had palpitations and severe constipation on Seroquel. She had marked weight gain on Abilify and Depakote. When generic Divalproex was tried she had breakthrough seizures.   Nocturnal polysomnogram on October 27, 2011, failed to show significant periodic limb movements, cyanosis, sleep apnea, or cardiac arrhythmia. EEG on February 20, 2012, was a normal study with the patient awake.   EEG performed March 25, 2017 was  normal  Surgical History Past Surgical History:  Procedure Laterality Date  . CYSTOSCOPY N/A 06/03/2017   Procedure: CYSTOSCOPY WITH EXAM UNDER ANESTHESIA;  Surgeon: Jerilee Field, MD;  Location: [facility];  Service: Urology;  Laterality: N/A;  . CYSTOSCOPY WITH HYDRODISTENSION AND BIOPSY  04/14/2012   Procedure: CYSTOSCOPY/BIOPSY/HYDRODISTENSION;  Surgeon: Lindaann Slough, MD;  Location: [facility];  Service: Urology;;  instillation of marcaine and pyridium  . EUA/  PAP SMEAR/  BREAST EXAM  03-12-2016   dr Erin Fulling [facility]  . EUA/ VAGINOSCOPY/ COLPOSCOPY FO THE HYMENAL RING  10-04-2009    dr Tamela Oddi  [facility]  . TONSILLECTOMY      Family History family history includes Pancreatic cancer in her sister; Seizures in her mother and sister. Family History is otherwise negative for migraines, seizures, cognitive impairment, blindness, deafness, birth defects, chromosomal disorder, autism.  Social History Social History   Socioeconomic History  . Marital status: Single    Spouse name: Not on file  . Number of children: Not on file  . Years of education: Not on file  . Highest education level: Not on file  Occupational History  . Not on file  Social Needs  . Financial resource strain: Not on file  . Food insecurity:    Worry: Not on file    Inability: Not on file  . Transportation needs:    Medical: Not on file    Non-medical: Not on file  Tobacco Use  . Smoking status: Never Smoker  . Smokeless tobacco: Never Used  Substance and Sexual Activity  . Alcohol use: No    Alcohol/week: 0.0 standard drinks  . Drug use: No  . Sexual activity: Never    Birth control/protection: Pill  Lifestyle  . Physical activity:    Days per week: Not on file    Minutes per session: Not on file  . Stress: Not on file  Relationships  . Social connections:    Talks on phone: Not on file    Gets together: Not on file    Attends religious service: Not on file      Active member of club or organization: Not on file    Attends meetings of clubs or organizations: Not on file    Relationship status: Not on file  Other Topics Concern  . Not on file  Social History Narrative   Lives with mom ([name] Lovan) and half-sister Narely Degraw) who is also mentally impaired.  Has CAP assistance, urinary incontinence, needs help with feeding,dressing, toileting   Graduated from [employer].  She enjoys playing with cars, dancing, and dogs.   Allergies Allergies  Allergen Reactions  . Abilify [Aripiprazole] Swelling and Palpitations  . Seroquel [Quetiapine Fumarate] Palpitations  . Bactrim [Sulfamethoxazole-Trimethoprim] Other (See Comments)    Abdominal pain  . Doxycycline Other (See Comments)    Stomach cramps  . Amoxicillin-Pot Clavulanate Hives    Has patient had a PCN reaction causing immediate rash, facial/tongue/throat swelling, SOB or lightheadedness with  hypotension: No Has patient had a PCN reaction causing severe rash involving mucus membranes or skin necrosis: No Has patient had a PCN reaction that required hospitalization: No Has patient had a PCN reaction occurring within the last 10 years: No If all of the above answers are "NO", then may proceed with Cephalosporin use.   Marland Kitchen. Keppra [Levetiracetam] Other (See Comments)    Aggressive behavior     Physical Exam Pulse 88   Ht 4' 9.5" (1.461 m)   Wt 184 lb 12.8 oz (83.8 kg)   BMI 39.30 kg/m  General: well developed, well nourished obese woman, lying on exam table, in no evident distress; black hair, brown eyes, right handed Head: normocephalic and atraumatic. Oropharynx benign. No dysmorphic features. Neck: supple with no carotid bruits. Cardiovascular: regular rate and rhythm, no murmurs. Respiratory: Clear to auscultation bilaterally Abdomen: Bowel sounds present all four quadrants, abdomen soft, non-tender, non-distended.  Musculoskeletal: No skeletal deformities or obvious  scoliosis.  Skin: no rashes or neurocutaneous lesions  Neurologic Exam Mental Status: Awake and fully alert. Language is limited.  Smiles responsively. Holds onto baby doll when not engaged in examination. Has variable eye contact. Tolerant of invasions in to her space today and did well following instructions with praise and coaching. Cranial Nerves: Fundoscopic exam - red reflex present.  Unable to fully visualize fundus.  Pupils equal briskly reactive to light.  Turns to localize faces and objects in the periphery. Turns to localize sounds in the periphery. Facial movements are symmetric. Motor: Normal functional bulk, tone and strength. She had occasional shaking of her left lower leg that stopped when I placed my hand on the shaking. Sensory: Withdrawal x 4 Coordination: Unable to adequately assess due to patient's inability to participate in examination. No dysmetria when reaching for objects. Gait and Station: Able to independently stand and bear weight. When walking, she turns the left toe inward. When we walked in the exam room, she held both of my hands and looked toward her mother. As I praised her for walking and took her into the hall, she did better and walked holding to one of my hands. She did fatigue and stop but when I allowed her brief rest she was able to continue. Reflexes: Diminished and symmetric. Toes neutral. No clonus  Impression 1. Partial epilepsy with impairment of consciousness 2. Generalized convulsive epilepsy 3. Insomnia and sleep arousals 4.  Intellectual disability 5.  Gait disorder 6.  Intermittent tremors 7.  At risk for falls  Recommendations for plan of care [facility]. Lori Miles has neither had nor required imaging or lab studies since the last visit other than what was performed by her PCP for her recent illnesses. I talked with Mom about Lori Miles and attempted to reassure her that Lori Miles is likely deconditioned but  does not have an ominous condition that Mom fears. I recommended physical therapy to help her to improve her strength and endurance, as well as her gait. She may need a walker or some other assistive device but I will await the physical therapist's recommendations. Mom continued to ask for MRI scan, blood tests and hospital admission for work up. I consulted with Dr Sharene SkeansHickling who came into the exam room to talk with Mom. He evaluated Helyne's gait as well, and told Mom that he also recommended physical therapy rather than extensive work up as Mom requested. He attempted to explain to Mom that she does not have symptoms consistent with the disorders  that she read about online but Mom persisted in asking for testing to be done. We were finally able to get Mom to agree to physical therapy and I attempted to reassure Mom that if further testing needs to be done that it will be done. I also talked with Mom about being careful about talking about her fears within Lori Miles's earshot and recommended that she praise and encourage Analiya to walk and be more active.  Mom called me after leaving the office again asking for the MRI, blood tests and/or admission to the hospital, saying that when she told her family about the office visit they convinced her that the information and recommendations given her was incorrect and that she needed to insist on the testing. I talked with Mom and again reassured her and asked her to try the physical therapy as recommended. Mom asked for inpatient rehabilitation admission [facility] and I explained that was not appropriate for Icy's condition.   I will see Malkia back in follow up in 1 month or sooner if needed.   The medication list was reviewed and reconciled.  No changes were made in the prescribed medications today.  A complete medication list was provided to the patient's mother.  Allergies as of 03/24/2018      Reactions    Abilify [aripiprazole] Swelling, Palpitations   Seroquel [quetiapine Fumarate] Palpitations   Bactrim [sulfamethoxazole-trimethoprim] Other (See Comments)   Abdominal pain   Doxycycline Other (See Comments)   Stomach cramps   Amoxicillin-pot Clavulanate Hives   Has patient had a PCN reaction causing immediate rash, facial/tongue/throat swelling, SOB or lightheadedness with hypotension: No Has patient had a PCN reaction causing severe rash involving mucus membranes or skin necrosis: No Has patient had a PCN reaction that required hospitalization: No Has patient had a PCN reaction occurring within the last 10 years: No If all of the above answers are "NO", then may proceed with Cephalosporin use.   Keppra [levetiracetam] Other (See Comments)   Aggressive behavior       Medication List       Accurate as of March 24, 2018 10:07 AM. Always use your most recent med list.        acetaminophen 325 MG tablet Commonly known as:  TYLENOL Take 2 tablets (650 mg total) by mouth every 6 (six) hours as needed for mild pain or moderate pain.   albuterol (2.5 MG/3ML) 0.083% nebulizer solution Commonly known as:  PROVENTIL INHALE CONTENTS OF 1 VIAL IN NEBULIZER EVERY 4 TO 6 HOURS IF NEEDED FOR COUGH OR WHEEZING   benzonatate 100 MG capsule Commonly known as:  TESSALON Take 2 capsules (200 mg total) by mouth 2 (two) times daily as needed for cough.   budesonide 1 MG/2ML nebulizer solution Commonly known as:  PULMICORT Take 2 mLs (1 mg total) by nebulization 2 (two) times daily.   cetirizine 10 MG tablet Commonly known as:  ZYRTEC Take 1 tablet (10 mg total) by mouth daily.   cloNIDine 0.1 MG tablet Commonly known as:  CATAPRES TAKE 1 AND 1/2 TABLET BY MOUTH AT BEDTIME   DEPAKOTE 500 MG DR tablet Generic drug:  divalproex TAKE 1 TABLET BY MOUTH IN THE MORNING AND 2 AT NIGHT   famotidine 20 MG tablet Commonly known as:  PEPCID Take 1 tablet (20 mg total) by mouth every 12 (twelve)  hours. Pharmacy-please d/c rx for ranitidine   formoterol 20 MCG/2ML nebulizer solution Commonly known as:  PERFOROMIST Take 2 mLs (20 mcg total) by nebulization 2 (two) times daily.   levofloxacin 750 MG tablet Commonly known as:  LEVAQUIN Take 1 tablet (750 mg total) by mouth daily. X 7 days   Levonorgestrel-Ethinyl Estradiol 0.15-0.03 &0.01 MG tablet Commonly known as:  SEASONIQUE Take 1 tablet by mouth daily.   Melatonin 3 MG Tabs Take 3 mg by mouth at bedtime as needed (for sleep).   mometasone 50 MCG/ACT nasal spray Commonly known as:  NASONEX Place 2 sprays into the nose daily.   montelukast 10 MG tablet Commonly known as:  SINGULAIR Take 1 tablet (10 mg total) by mouth at bedtime.   ondansetron 4 MG disintegrating tablet Commonly known as:  ZOFRAN ODT Take 1 tablet (4 mg total) by mouth every 6 (six) hours as needed.   polyethylene glycol powder powder Commonly known as:  GLYCOLAX/MIRALAX Dissolve 17 grasm in at least 8 ounces water/juice and drink by mouth twice daily   Revefenacin 175 MCG/3ML Soln Commonly known as:  YUPELRI Inhale 1 vial into the lungs daily.   senna 8.6 MG tablet Commonly known as:  SENOKOT Take 1 tablet by mouth at bedtime.   traZODone 50 MG tablet Commonly known as:  DESYREL Take 2 tablets (100 mg total) by mouth at bedtime.      [name] was consulted regarding the patient.   Total time spent with the patient was 65 minutes, of which 50% or more was spent in counseling and coordination of care.   Lori Rising NP-C

## 2018-03-26 ENCOUNTER — Encounter (INDEPENDENT_AMBULATORY_CARE_PROVIDER_SITE_OTHER): Payer: Self-pay | Admitting: Family

## 2018-03-26 DIAGNOSIS — R400 Somnolence: Secondary | ICD-10-CM | POA: Diagnosis not present

## 2018-03-26 NOTE — Patient Instructions (Signed)
Thank you for coming in today.   Instructions for you until your next appointment are as follows: 1. I will refer Lori Miles for physical therapy at [facility]. 2. Try to work on being positive and encouraging Lori Miles to be active. Try to avoid talking about your fears about her condition when she can hear you as we discussed today 3. I will see Lori Miles back in follow up in 1 month or sooner if needed.

## 2018-03-28 DIAGNOSIS — R569 Unspecified convulsions: Secondary | ICD-10-CM | POA: Diagnosis not present

## 2018-03-30 ENCOUNTER — Telehealth: Payer: Self-pay | Admitting: Pulmonary Disease

## 2018-03-30 NOTE — Telephone Encounter (Addendum)
Called and spoke to pt's mother, Lori Miles (DPR). Lori Miles is requesting ONO results.  I have contacted [facility] and requested that ONO results be faxed to our office. Will hold message in triage until results are received.

## 2018-03-31 NOTE — Telephone Encounter (Signed)
Kelli please look out for ONO results and have Dr. Craige Cotta review. Thanks

## 2018-03-31 NOTE — Telephone Encounter (Signed)
I do apologize however the pharmacy is stating that we need to have it added to the office note so that I can send to them. Please and thank you.

## 2018-03-31 NOTE — Telephone Encounter (Signed)
[facility] pharmacy called to request a different diagnosis for Yupelri. The covered diagnosis are emphysema, chronic bronchitis or COPD. Please advise on alternative diagnosis if appropriate. [phone] ext [phone]

## 2018-03-31 NOTE — Telephone Encounter (Signed)
Detailed message advising [facility] of the alternative diagnosis. I did request a call back with further questions or concerns.

## 2018-03-31 NOTE — Telephone Encounter (Signed)
Can use COPD with asthma.

## 2018-04-01 NOTE — Telephone Encounter (Signed)
The note has been updated. Please forward note.

## 2018-04-01 NOTE — Telephone Encounter (Signed)
Aneta Mins from [facility] called and wanted to know the status of the office notes being faxed over for patient. Needs to have Dx code regarding COPD in the notes. Please fax notes to the at [phone]

## 2018-04-01 NOTE — Telephone Encounter (Signed)
Note faxed to pharmacy

## 2018-04-02 NOTE — Telephone Encounter (Signed)
ONO results are placed in VS green folder to look at this morning. Vs is out of the office this week, will review once back in the office. LVM for patient's mother regarding above info on ONO results.  VS please advise once this is completed. Thank you.

## 2018-04-06 ENCOUNTER — Emergency Department (HOSPITAL_COMMUNITY)
Admission: EM | Admit: 2018-04-06 | Discharge: 2018-04-06 | Disposition: A | Discharge status: Home / Self Care | Payer: Medicare Other | Attending: Emergency Medicine | Admitting: Emergency Medicine

## 2018-04-06 DIAGNOSIS — Z79899 Other long term (current) drug therapy: Secondary | ICD-10-CM | POA: Insufficient documentation

## 2018-04-06 DIAGNOSIS — F809 Developmental disorder of speech and language, unspecified: Secondary | ICD-10-CM | POA: Insufficient documentation

## 2018-04-06 DIAGNOSIS — R8290 Unspecified abnormal findings in urine: Secondary | ICD-10-CM | POA: Diagnosis present

## 2018-04-06 DIAGNOSIS — R062 Wheezing: Secondary | ICD-10-CM | POA: Diagnosis not present

## 2018-04-06 DIAGNOSIS — B9789 Other viral agents as the cause of diseases classified elsewhere: Secondary | ICD-10-CM | POA: Diagnosis not present

## 2018-04-06 DIAGNOSIS — R112 Nausea with vomiting, unspecified: Secondary | ICD-10-CM | POA: Insufficient documentation

## 2018-04-06 DIAGNOSIS — J069 Acute upper respiratory infection, unspecified: Secondary | ICD-10-CM | POA: Diagnosis not present

## 2018-04-06 LAB — URINALYSIS, ROUTINE W REFLEX MICROSCOPIC
Bilirubin Urine: NEGATIVE
GLUCOSE, UA: NEGATIVE mg/dL
Hgb urine dipstick: NEGATIVE
Ketones, ur: 5 mg/dL — AB
Leukocytes, UA: NEGATIVE
Nitrite: NEGATIVE
PROTEIN: NEGATIVE mg/dL
Specific Gravity, Urine: 1.034 — ABNORMAL HIGH (ref 1.005–1.030)
pH: 5 (ref 5.0–8.0)

## 2018-04-06 LAB — POC URINE PREG, ED: Preg Test, Ur: NEGATIVE

## 2018-04-06 MED ORDER — ONDANSETRON 4 MG PO TBDP: 4.0000 mg | ORAL_TABLET | Freq: Three times a day (TID) | ORAL | 0 refills | Status: DC | PRN

## 2018-04-06 MED ORDER — ONDANSETRON 4 MG PO TBDP
4.0000 mg | ORAL_TABLET | Freq: Once | ORAL | Status: AC
  Administered 2018-04-06: 4 mg via ORAL
  Filled 2018-04-06: qty 1

## 2018-04-06 MED ORDER — IPRATROPIUM-ALBUTEROL 0.5-2.5 (3) MG/3ML IN SOLN
3.0000 mL | Freq: Once | RESPIRATORY_TRACT | Status: AC
  Administered 2018-04-06: 3 mL via RESPIRATORY_TRACT
  Filled 2018-04-06: qty 3

## 2018-04-06 NOTE — Telephone Encounter (Signed)
ATC pt's mother, [name]. Due to her VM being full I could not leave a message. 

## 2018-04-06 NOTE — ED Notes (Signed)
Pt drank soda with meds

## 2018-04-06 NOTE — ED Provider Notes (Signed)
[facility]   CSN: [id number] Arrival date & time: 04/06/18  [id number]     History   Chief Complaint Chief Complaint  Patient presents with  . URI  . Urinary Frequency    HPI Lori Miles is a 23 y.o. female.  23 yo F with a significant past medical history to developmental delay comes in with a chief complaint of foul-smelling urine for the past 3 days as well as cough and congestion.  The URI symptoms been going on for a couple months now.  Patient has been seen multiple times in the ED for this.  Has had different rounds of antibiotics most recently with Levaquin.  Mom said that they followed up in the office and the allergist independently evaluated the x-rays and said that she had pneumonia as well.  She has been worse over the past couple days with some shaking which mom says normally happens when she gets an infection of some sort.  The history is provided by a parent.  URI  Presenting symptoms: congestion, cough and fever   Presenting symptoms: no rhinorrhea   Associated symptoms: wheezing   Associated symptoms: no arthralgias, no headaches and no myalgias   Urinary Frequency  Associated symptoms include shortness of breath. Pertinent negatives include no chest pain and no headaches.  Illness  Severity:  Moderate Onset quality:  Gradual Duration:  1 month Timing:  Constant Progression:  Worsening Chronicity:  Recurrent Associated symptoms: congestion, cough, fever, shortness of breath, vomiting and wheezing   Associated symptoms: no chest pain, no headaches, no myalgias, no nausea and no rhinorrhea     Past Medical History:  Diagnosis Date  . Asthma   . Autism    mom states no actual dx, but admits she has some symptoms  . Bacteriuria   . Chronic constipation   . Cognitive developmental delay   . Disruptive behavior disorder   . Family history of adverse reaction to anesthesia    mother had itching after  anesthesia one time  . Frequency-urgency syndrome   . Gait disorder    ORGANIC PER NERUOLGIST NOTE (DR HICKLING)  . 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 GERD (gastroesophageal reflux disease)   . Gout   . H/O sinus tachycardia   . History of acute respiratory failure 02/14/2015   SECONDARY TO CAP  . History of recurrent UTIs   . Interstitial cystitis   . Moderate intellectual disability   . OSA (obstructive sleep apnea)    no machine yet   . Partial epilepsy with impairment of consciousness (HCC)    FOLLOWED BY DR HICKLING  . Pneumonia 2018 last time  . PONV (postoperative nausea and vomiting)   . Seizure (HCC)    most recent sz " at the end of summer"-last sz years ago per mother  . Urinary incontinence     Patient Active Problem List   Diagnosis Date Noted  . Dysuria 02/20/2018  . Allergic rhinitis 04/03/2017  . Recurrent falls 03/21/2017  . Weakness 03/21/2017  . Venous insufficiency 03/21/2017  . Excessive daytime sleepiness 03/05/2016  . 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 Cough variant asthma vs UACS/ vcd  03/15/2015  . Autism spectrum disorder 02/14/2015  . Nausea with vomiting 02/14/2015  . Obesity, morbid (HCC) 12/13/2014  . 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 Acanthosis nigricans 11/14/2014  .  Toeing-in 08/29/2014  . 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 Recurrent urinary tract infection 08/01/2011  . Asthma 08/01/2011  . Chronic constipation 08/01/2011  . Contraception 08/01/2011    Past Surgical History:  Procedure Laterality Date  . CYSTOSCOPY N/A 06/03/2017   Procedure: CYSTOSCOPY WITH EXAM UNDER ANESTHESIA;  Surgeon: Jerilee Field, MD;  Location: [facility];   Service: Urology;  Laterality: N/A;  . CYSTOSCOPY WITH HYDRODISTENSION AND BIOPSY  04/14/2012   Procedure: CYSTOSCOPY/BIOPSY/HYDRODISTENSION;  Surgeon: Lindaann Slough, MD;  Location: [facility];  Service: Urology;;  instillation of marcaine and pyridium  . EUA/  PAP SMEAR/  BREAST EXAM  03-12-2016   dr Erin Fulling [facility]  . EUA/ VAGINOSCOPY/ COLPOSCOPY FO THE HYMENAL RING  10-04-2009    dr Tamela Oddi  [facility]  . TONSILLECTOMY       OB History   No obstetric history on file.      Home Medications    Prior to Admission medications   Medication Sig Start Date End Date Taking? Authorizing Provider  acetaminophen (TYLENOL) 325 MG tablet Take 2 tablets (650 mg total) by mouth every 6 (six) hours as needed for mild pain or moderate pain. 02/27/17   Everlene Farrier, PA-C  albuterol (PROVENTIL) (2.5 MG/3ML) 0.083% nebulizer solution INHALE CONTENTS OF 1 VIAL IN NEBULIZER EVERY 4 TO 6 HOURS IF NEEDED FOR COUGH OR WHEEZING 03/17/18   Kozlow, Alvira Philips, MD  benzonatate (TESSALON) 100 MG capsule Take 2 capsules (200 mg total) by mouth 2 (two) times daily as needed for cough. 12/18/17   Bast, Traci A, NP  budesonide (PULMICORT) 1 MG/2ML nebulizer solution Take 2 mLs (1 mg total) by nebulization 2 (two) times daily. 03/16/18 04/15/18  Kozlow, Alvira Philips, MD  cetirizine (ZYRTEC) 10 MG tablet Take 1 tablet (10 mg total) by mouth daily. 03/16/18   Kozlow, Alvira Philips, MD  cloNIDine (CATAPRES) 0.1 MG tablet TAKE 1 AND 1/2 TABLET BY MOUTH AT BEDTIME Patient taking differently: Take 0.15 mg by mouth at bedtime.  02/20/18   Elveria Rising, NP  DEPAKOTE 500 MG DR tablet TAKE 1 TABLET BY MOUTH IN THE MORNING AND 2 AT NIGHT 03/16/18   Elveria Rising, NP  famotidine (PEPCID) 20 MG tablet Take 1 tablet (20 mg total) by mouth every 12 (twelve) hours. Pharmacy-please d/c rx for ranitidine 03/19/18   Pyrtle, Carie Caddy, MD  formoterol (PERFOROMIST) 20 MCG/2ML nebulizer solution Take 2 mLs (20 mcg total) by nebulization 2  (two) times daily. 03/16/18   Kozlow, Alvira Philips, MD  levofloxacin (LEVAQUIN) 750 MG tablet Take 1 tablet (750 mg total) by mouth daily. X 7 days 03/14/18   Mesner, Barbara Cower, MD  Levonorgestrel-Ethinyl Estradiol (SEASONIQUE) 0.15-0.03 &0.01 MG tablet Take 1 tablet by mouth daily. 01/29/18   Sharyon Cable, CNM  Melatonin 3 MG TABS Take 3 mg by mouth at bedtime as needed (for sleep).     [provider]  mometasone (NASONEX) 50 MCG/ACT nasal spray Place 2 sprays into the nose daily. 03/17/18   Kozlow, Alvira Philips, MD  montelukast (SINGULAIR) 10 MG tablet Take 1 tablet (10 mg total) by mouth at bedtime. 03/16/18   Kozlow, Alvira Philips, MD  ondansetron (ZOFRAN ODT) 4 MG disintegrating tablet Take 1 tablet (4 mg total) by mouth every 8 (eight) hours as needed for nausea or vomiting. 04/06/18   Melene Plan, DO  polyethylene glycol powder (GLYCOLAX/MIRALAX)  powder Dissolve 17 grasm in at least 8 ounces water/juice and drink by mouth twice daily Patient taking differently: Take 17 g by mouth daily as needed for mild constipation.  02/18/18   Pyrtle, Carie CaddyJay M, MD  Revefenacin (YUPELRI) 175 MCG/3ML SOLN Inhale 1 vial into the lungs daily. 03/19/18   Kozlow, Alvira PhilipsEric J, MD  senna (SENOKOT) 8.6 MG tablet Take 1 tablet by mouth at bedtime.     [provider]  traZODone (DESYREL) 50 MG tablet Take 2 tablets (100 mg total) by mouth at bedtime. 02/20/18   Elveria RisingGoodpasture, Tina, NP    Family History Family History  Problem Relation Age of Onset  . Seizures Mother   . Seizures Sister        1 sister has  . Pancreatic cancer Sister   . Colon cancer Neg Hx   . Colon polyps Neg Hx   . Esophageal cancer Neg Hx   . Rectal cancer Neg Hx   . Stomach cancer Neg Hx     Social History Social History   Tobacco Use  . Smoking status: Never Smoker  . Smokeless tobacco: Never Used  Substance Use Topics  . Alcohol use: No    Alcohol/week: 0.0 standard drinks  . Drug use: No     Allergies   Abilify [aripiprazole];  Seroquel [quetiapine fumarate]; Bactrim [sulfamethoxazole-trimethoprim]; Doxycycline; Amoxicillin-pot clavulanate; and Keppra [levetiracetam]   Review of Systems Review of Systems  Constitutional: Positive for fever. Negative for chills.  HENT: Positive for congestion. Negative for rhinorrhea.   Eyes: Negative for redness and visual disturbance.  Respiratory: Positive for cough, shortness of breath and wheezing.   Cardiovascular: Negative for chest pain and palpitations.  Gastrointestinal: Positive for vomiting. Negative for nausea.  Genitourinary: Positive for frequency. Negative for dysuria and urgency.  Musculoskeletal: Negative for arthralgias and myalgias.  Skin: Negative for pallor and wound.  Neurological: Negative for dizziness and headaches.     Physical Exam Updated Vital Signs BP 96/88 (BP Location: Right Arm)   Pulse 80   Temp 97.8 F (36.6 C) (Oral)   Resp 18   SpO2 99%   Physical Exam Vitals signs and nursing note reviewed.  Constitutional:      General: She is not in acute distress.    Appearance: She is well-developed. She is not diaphoretic.  HENT:     Head: Normocephalic and atraumatic.  Eyes:     Pupils: Pupils are equal, round, and reactive to light.  Neck:     Musculoskeletal: Normal range of motion and neck supple.  Cardiovascular:     Rate and Rhythm: Normal rate and regular rhythm.     Heart sounds: No murmur. No friction rub. No gallop.   Pulmonary:     Effort: Pulmonary effort is normal.     Breath sounds: No wheezing or rales.  Abdominal:     General: There is no distension.     Palpations: Abdomen is soft.     Tenderness: There is no abdominal tenderness.  Musculoskeletal:        General: No tenderness.  Skin:    General: Skin is warm and dry.  Neurological:     Mental Status: She is alert.  Psychiatric:        Mood and Affect: Mood is anxious.        Behavior: Behavior is cooperative.      ED Treatments / Results  Labs (all  labs ordered are listed, but only abnormal results are displayed) Labs  Reviewed  URINALYSIS, ROUTINE W REFLEX MICROSCOPIC - Abnormal; Notable for the following components:      Result Value   Color, Urine AMBER (*)    Specific Gravity, Urine 1.034 (*)    Ketones, ur 5 (*)    All other components within normal limits  POC URINE PREG, ED    EKG None  Radiology No results found.  Procedures Procedures (including critical care time)  Medications Ordered in ED Medications  ipratropium-albuterol (DUONEB) 0.5-2.5 (3) MG/3ML nebulizer solution 3 mL (3 mLs Nebulization Given 04/06/18 0751)  ondansetron (ZOFRAN-ODT) disintegrating tablet 4 mg (4 mg Oral Given 04/06/18 0806)     Initial Impression / Assessment and Plan / ED Course  I have reviewed the triage vital signs and the nursing notes.  Pertinent labs & imaging results that were available during my care of the patient were reviewed by me and considered in my medical decision making (see chart for details).     23 yo F with a chief complaint of cough congestion and foul-smelling urine.  Going on for the past 3 days or so.  Patient actually has had symptoms for the past month or so.  Mom thinks it got a little bit better after treatment with Levaquin and thinks that she needs a few more tablets.  On my exam she is well-appearing and nontoxic.  She is not coughing she has clear lung sounds.  Mom is fairly insistent that the patient needs a chest x-ray urine and lab work though with clear lungs and no hypoxia I do not feel that a chest x-ray would be helpful, will obtain a UA.  Mom is reporting some wheezing at home which I think more likely is upper airway noises but will give a DuoNeb here.  Patient was just on 2 rounds of antibiotics.  She had 13 chest x-rays in the past year.   UA is negative for infection negative nitrates negative leukocyte esterase.  She had small amount of ketones and increased specific gravity.  More consistent  with dehydration.  Patient was able to tolerate p.o., no vomiting here.  I discussed this with the mother who is refusing to leave until she is prescribed antibiotics.  I told her that this was inappropriate and I felt that she had been given some likely inappropriate prescriptions for antibiotics that would more likely harm than help her.  The mom is unwilling to listen to me and is demanding to speak to another provider.  I suggested that she could be discharged and check back into the emergency department if she felt this way, I did discuss with her that is probably more appropriate to see the family physician or the urologist for possible treatment with antibiotics with a negative urine, as they noted the patient likely better than I had and if they feel that she needs antibiotics based on his history than they could prescribe them.  8:41 AM:  I have discussed the diagnosis/risks/treatment options with the family and believe the pt to be eligible for discharge home to follow-up with PCP. We also discussed returning to the ED immediately if new or worsening sx occur. We discussed the sx which are most concerning (e.g., sudden worsening pain, fever, inability to tolerate by mouth) that necessitate immediate return. Medications administered to the patient during their visit and any new prescriptions provided to the patient are listed below.  Medications given during this visit Medications  ipratropium-albuterol (DUONEB) 0.5-2.5 (3) MG/3ML nebulizer solution 3 mL (3  mLs Nebulization Given 04/06/18 0751)  ondansetron (ZOFRAN-ODT) disintegrating tablet 4 mg (4 mg Oral Given 04/06/18 0806)     The patient appears reasonably screen and/or stabilized for discharge and I doubt any other medical condition or other [facility] requiring further screening, evaluation, or treatment in the ED at this time prior to discharge.    Final Clinical Impressions(s) / ED Diagnoses   Final diagnoses:  Viral URI with cough   Nausea and vomiting in adult    ED Discharge Orders         Ordered    ondansetron (ZOFRAN ODT) 4 MG disintegrating tablet  Every 8 hours PRN     04/06/18 0828           Melene Plan, DO 04/06/18 [phone]

## 2018-04-06 NOTE — Telephone Encounter (Signed)
Pt mom  is calling back is calling back [phone]

## 2018-04-06 NOTE — Discharge Instructions (Signed)
Your urine was clean for infection.  Follow up with your family doctor.

## 2018-04-06 NOTE — ED Triage Notes (Signed)
Pt arrives with mother with c/o of URI with wheezing at times. Mom states pt has been incontinent of foul smelling urine that started 3 days ago. Pt just recently treated for possible pneumonia.

## 2018-04-06 NOTE — ED Notes (Signed)
Mom states had UTI, pneumonia and sinus infection  X 2 weeks ago , now still having smelly urine

## 2018-04-07 NOTE — Telephone Encounter (Signed)
ATC pt's mother, [name]. Due to her VM being full I could not leave a message.

## 2018-04-08 ENCOUNTER — Telehealth (INDEPENDENT_AMBULATORY_CARE_PROVIDER_SITE_OTHER): Payer: Self-pay | Admitting: Family

## 2018-04-08 DIAGNOSIS — J4520 Mild intermittent asthma, uncomplicated: Secondary | ICD-10-CM | POA: Diagnosis not present

## 2018-04-08 DIAGNOSIS — R569 Unspecified convulsions: Secondary | ICD-10-CM | POA: Diagnosis not present

## 2018-04-08 LAB — URINE CULTURE
Culture: <10000 — AB
Special Requests: NORMAL

## 2018-04-08 NOTE — Telephone Encounter (Signed)
ATC pt's mother, [name]. Due to her VM being full I could not leave a message. 

## 2018-04-08 NOTE — Telephone Encounter (Signed)
Called and spoke with  Patients Mother, Lori Miles. She was requesting ONO results.  Informed her results were not available at this time, but someone would call as soon as Dr. Craige Cotta resulted them.  She stated that she wanted Dr. Craige Cotta to know that the Patient had recently been in the ED,for UTI, ear infection, and asthma.  She wanted Dr. Craige Cotta to know that she is on Perforomist, Pulmicort, and Yupelri nebs. She said that she was having spells gasping for breath, and she felt like her asthma was worse. She is requesting Dr. Craige Cotta recommendations.  Will route message Dr. Craige Cotta, to advise

## 2018-04-08 NOTE — Telephone Encounter (Signed)
Mom called and said that Lori Miles has been sick this week with URI. She said that Lori Miles's face swelled on one side, and that her mouth was crooked. She said that it improved a little then came back. Mom said that others tell her that it looks like a stroke and she is very fearful that it is a stroke. Mom also reports ongoing problems with Lori Miles being incontinent, dropping things, having trouble walking and is concerned that all these things also point to Lori Miles having a stroke. [name] has PT evaluation next week. I attempted to reassure Mom that her description of the facial swelling does not indicate stroke. She remained unconvinced but agreed to continue to monitor Lori Miles and to take her to PT evaluation next week. TG

## 2018-04-08 NOTE — Telephone Encounter (Signed)
Patient's mother Lori Miles is returning phone call.  Phone number is [phone].

## 2018-04-08 NOTE — Telephone Encounter (Signed)
Thank you for taking this call, noted.

## 2018-04-09 NOTE — Telephone Encounter (Signed)
Spoke with pt's mother and notified of results per Dr Craige Cotta  She verbalized understanding  She is not at her calendar at the moment and she will call back to schedule appt later  Nothing further needed

## 2018-04-09 NOTE — Telephone Encounter (Signed)
ONO with RA 03/25/18 >> test time 7 hrs 21 min.  Basal SpO2 96%, low SpO2 85%.  Spent 2.1 min with SpO2 < 88%.   Please let her know that her ONO did not show oxygen desaturation, and she does not require supplemental oxygen at night.  Please schedule her for an ROV with me or an NP to review status of her asthma.

## 2018-04-13 ENCOUNTER — Telehealth (INDEPENDENT_AMBULATORY_CARE_PROVIDER_SITE_OTHER): Payer: Self-pay | Admitting: Family

## 2018-04-13 DIAGNOSIS — J449 Chronic obstructive pulmonary disease, unspecified: Secondary | ICD-10-CM | POA: Diagnosis not present

## 2018-04-13 NOTE — Telephone Encounter (Signed)
Mom [name] Berko called and said that she was thinking of changing [name] primary care to [facility]. She wanted to know if that would be ok with Dr Sharene Skeans. I told Mom that I would call her back if he disagreed. TG

## 2018-04-13 NOTE — Telephone Encounter (Signed)
I have no problem with her switching providers.  This is a good group.

## 2018-04-14 ENCOUNTER — Other Ambulatory Visit: Payer: Self-pay

## 2018-04-14 ENCOUNTER — Ambulatory Visit: Payer: Medicare Other | Attending: Family | Admitting: Physical Therapy

## 2018-04-14 DIAGNOSIS — M6281 Muscle weakness (generalized): Secondary | ICD-10-CM | POA: Insufficient documentation

## 2018-04-14 DIAGNOSIS — R2689 Other abnormalities of gait and mobility: Secondary | ICD-10-CM | POA: Insufficient documentation

## 2018-04-14 DIAGNOSIS — R2681 Unsteadiness on feet: Secondary | ICD-10-CM | POA: Insufficient documentation

## 2018-04-15 NOTE — Therapy (Signed)
[facility] [address] [place], [place], [address] Phone: [phone]   Fax:  [phone]  Physical Therapy Evaluation  Patient Details  Name: Lori GreenerJasmine J Miles MRN: [id number] Date of Birth: [birth year] Referring Provider (PT): Elveria Risingina Goodpasture, NP   Encounter Date: 04/14/2018  PT End of Session - 04/15/18 1934    Visit Number  1    Number of Visits  5    Date for PT Re-Evaluation  05/22/18    Authorization Type  [employer] Medicare    Authorization Time Period  04-14-18 - 07-11-18    PT Start Time  0805    PT Stop Time  0847    PT Time Calculation (min)  42 min    Activity Tolerance  Patient tolerated treatment well    Behavior During Therapy  Flat affect   pt appeared sleepy and tired during eval      Past Medical History:  Diagnosis Date  . Asthma   . Autism    mom states no actual dx, but admits she has some symptoms  . Bacteriuria   . Chronic constipation   . Cognitive developmental delay   . Disruptive behavior disorder   . Family history of adverse reaction to anesthesia    mother had itching after anesthesia one time  . Frequency-urgency syndrome   . Gait disorder    ORGANIC PER NERUOLGIST NOTE (DR HICKLING)  . 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 GERD (gastroesophageal reflux disease)   . Gout   . H/O sinus tachycardia   . History of acute respiratory failure 02/14/2015   SECONDARY TO CAP  . History of recurrent UTIs   . Interstitial cystitis   . Moderate intellectual disability   . OSA (obstructive sleep apnea)    no machine yet   . Partial epilepsy with impairment of consciousness (HCC)    FOLLOWED BY DR HICKLING  . Pneumonia 2018 last time  . PONV (postoperative nausea and vomiting)   . Seizure (HCC)    most recent sz " at the end of summer"-last sz years ago per mother  . Urinary incontinence     Past Surgical History:  Procedure Laterality Date  .  CYSTOSCOPY N/A 06/03/2017   Procedure: CYSTOSCOPY WITH EXAM UNDER ANESTHESIA;  Surgeon: Jerilee FieldEskridge, [name], MD;  [facility];  Service: Urology;  Laterality: N/A;  . CYSTOSCOPY WITH HYDRODISTENSION AND BIOPSY  04/14/2012   Procedure: CYSTOSCOPY/BIOPSY/HYDRODISTENSION;  Surgeon: Lindaann SloughMarc-Henry Nesi, MD;  Location: [facility];  Service: Urology;;  instillation of marcaine and pyridium  . EUA/  PAP SMEAR/  BREAST EXAM  03-12-2016   [facility]  . EUA/ VAGINOSCOPY/ COLPOSCOPY FO THE HYMENAL RING  10-04-2009    dr Tam[facility]  . TONSILLECTOMY      There were no vitals filed for this visit.   Subjective Assessment - 04/15/18 1907    Subjective  Pt is accompanied by her mother to PT eval who provides history; mother reports pt has had UTI's and has had episodes of severe tremors with near paralysis during these episodes; mother states pt must sit down to avoid falling down.  Mother states these episodes occur daily, lasting for a few minutes; mother says she wonders if pt has MS or is having mini strokes; cause of these severe tremors is unknown at this time:  mother states LLE is more impaired than RLE and LLE turns in - says pt is "  pigeon-toed"     Patient is accompained by:  Family member   mother Lori Miles   Pertinent History  Cognitive developmentally delayed:  recurrent falls:  weakness:  tremor, unspecified:  generalized convulsive epilepsy;  autism spectrum disorder    Patient Stated Goals  improve walking and try to stop the tremors    Currently in Pain?  Other (Comment)   pt touches left knee when she is asked if she is in pain; unable to verbally rate intensity and does not verbalize exact location of pain   Pain Location  Knee    Pain Orientation  Left    Pain Descriptors / Indicators  Other (Comment)   pt unable to describe pain   Pain Type  Other (Comment)   pain type is unknown due to inability to verbalize and describe pain         [facility] PT Assessment - 04/15/18 0001      Assessment   Medical Diagnosis  Gait Disorder; Tremors    Referring Provider (PT)  Elveria Risingina Goodpasture, NP    Onset Date/Surgical Date  --   Dec. 2019     Precautions   Precautions  Fall;Other (comment)   episodes of severe tremors with near paralysis per Mom     Restrictions   Weight Bearing Restrictions  No      Balance Screen   Has the patient fallen in the past 6 months  Yes    How many times?  1    Has the patient had a decrease in activity level because of a fear of falling?   No    Is the patient reluctant to leave their home because of a fear of falling?   No      Prior Function   Level of Independence  Needs assistance with ADLs;Independent with household mobility without device   Due to cognitive dev. delay     ROM / Strength   AROM / PROM / Strength  Strength      Strength   Overall Strength Comments  unable to accurately assess due to cognitive deficits;       Transfers   Transfers  Sit to Stand    Sit to Stand  5: Supervision    Comments  Pt used UE support from mat       Ambulation/Gait   Ambulation/Gait  Yes    Ambulation/Gait Assistance  5: Supervision    Ambulation Distance (Feet)  100 Feet    Assistive device  None    Gait Pattern  Step-through pattern   Lt foot turns in   Ambulation Surface  Level;Indoor    Gait velocity   14.69 secs = 2.23 ft/sec     Stairs  Yes    Stairs Assistance  5: Supervision    Stair Management Technique  Two rails;Forwards;Step to pattern    Number of Stairs  4    Height of Stairs  6      Standardized Balance Assessment   Standardized Balance Assessment  Timed Up and Go Test      Timed Up and Go Test   Normal TUG (seconds)  15.63                Objective measurements completed on examination: See above findings.        Rolling walker not recommended at this time due to no tremors occurred during PT evaluation; will continue to monitor need for  assistive device  PT Education - 04/15/18 1933    Education Details  recommended to pt's mother that pt would benefit from custom orthotic inserts due to very flat feet with minimal arch    Person(s) Educated  Patient;Parent(s)    Methods  Explanation    Comprehension  Verbalized understanding          PT Long Term Goals - 04/15/18 2002      PT LONG TERM GOAL #1   Title  Pt. will amb. 500' on flat, even surface without LOB.      Time  4    Period  Weeks    Status  New    Target Date  05/22/18      PT LONG TERM GOAL #2   Title  Pt will perform at least 10" on recumbent bike nonstop for improved endurance/activity tolerance.    Time  4    Period  Weeks    Status  New    Target Date  05/22/18      PT LONG TERM GOAL #3   Title  Mother will verbalize understanding of HEP to assist with LE strengthening.  (11-20-14)    Time  4    Period  Weeks    Status  New    Target Date  05/22/18      PT LONG TERM GOAL #4   Title  Pt. will perform 10" nonstop on bike to demo improved endurance/activity tolerance.  (11-20-14)    Time  4    Status  New    Target Date  05/22/18             Plan - 04/15/18 1943    Clinical Impression Statement  Pt is a 23 yr old lady with developmental delay, gait disorder, weakness with LLE more impaired than RLE, and episodic tremors (note - tremors did not occur during PT eval).  Mother reports pt has near paralysis when these tremors occur. Pt exhibits toeing in of LLE and has significantly flat feet.  Pt's TUG score is 15.63 secs, indicative of moderate fall risk.  Pt would benefit from PT to address gait deviations, LE weakness, and decreased endurance.                                                                                                                            History and Personal Factors relevant to plan of care:  generalized convulsive epilepsy, autism spectrum disorder, developmental delay    Clinical Presentation  Stable     Clinical Presentation due to:  episodes of tremors/near paralysis per mother's report - tremors of unknown etiology     Rehab Potential  Good    PT Frequency  1x / week    PT Duration  4 weeks    PT Treatment/Interventions  ADLs/Self Care Home Management;DME Instruction;Gait training;Stair training;Orthotic Fit/Training;Patient/family education;Neuromuscular re-education;Balance training;Therapeutic exercise;Therapeutic activities    PT Next Visit Plan  HEP for LE strengthening  Consulted and Agree with Plan of Care  Patient       Patient will benefit from skilled therapeutic intervention in order to improve the following deficits and impairments:  Abnormal gait, Decreased balance, Decreased strength, Decreased activity tolerance, Decreased endurance, Decreased safety awareness, Pain, Decreased coordination  Visit Diagnosis: Other abnormalities of gait and mobility - Plan: PT plan of care cert/re-cert  Muscle weakness (generalized) - Plan: PT plan of care cert/re-cert  Unsteadiness on feet - Plan: PT plan of care cert/re-cert     Problem List Patient Active Problem List   Diagnosis Date Noted  . Dysuria 02/20/2018  . Allergic rhinitis 04/03/2017  . Recurrent falls 03/21/2017  . Weakness 03/21/2017  . Venous insufficiency 03/21/2017  . Excessive daytime sleepiness 03/05/2016  . 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 Cough variant asthma vs UACS/ vcd  03/15/2015  . Autism spectrum disorder 02/14/2015  . Nausea with vomiting 02/14/2015  . Obesity, morbid (HCC) 12/13/2014  . 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 Acanthosis nigricans 11/14/2014  . Toeing-in 08/29/2014  . 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 Recurrent urinary tract infection  08/01/2011  . Asthma 08/01/2011  . Chronic constipation 08/01/2011  . Contraception 08/01/2011    Kary Kos, PT 04/15/2018, 8:13 PM  [facility] [address] [place], [place], [address] Phone: [phone]   Fax:  [phone]  Name: DAYANA HERGERT MRN: [id number] Date of Birth: Oct 17, 1995

## 2018-04-16 ENCOUNTER — Telehealth (INDEPENDENT_AMBULATORY_CARE_PROVIDER_SITE_OTHER): Payer: Self-pay | Admitting: Family

## 2018-04-16 NOTE — Telephone Encounter (Signed)
Noted, thanks!

## 2018-04-16 NOTE — Telephone Encounter (Signed)
Mom [name] called with concerns about Adilenne having seizures. She said that [facility] was evaluated by PT and they saw the one sided weakness that Mom had described at her last visit. She said that the PT said that she may be having seizures causing intermittent paralysis. Mom wants testing done to see if Meaghann is having non-convulsive seizures. Mom said that when Osiris was a child, Dr Sharene Skeans didn't believe that she was having seizures, then she had evaluation at [facility] and they determined that Jahzara's behavior was seizures. Mom wants further evaluation for seizures. Mom said that Aniqa's legs jerked at times and that then she had one sided weakness for awhile afterwards. Mom said that this behavior could last minutes to hours. She said that she hadn't been able to video the behavior because of taking care of Alianna at the time in order to keep her from falling. I talked with Mom and instructed her to teach her family members how to video using Mom's phone or iPad so that the behavior could be captured on video and Mom could still care for [facility], and then we could determine if more testing was indicated after seeing a video. Mom persisted in wanting immediate testing done but then agreed with this plan. TG

## 2018-04-20 ENCOUNTER — Other Ambulatory Visit: Payer: Self-pay | Admitting: Allergy and Immunology

## 2018-04-21 DIAGNOSIS — G809 Cerebral palsy, unspecified: Secondary | ICD-10-CM | POA: Diagnosis not present

## 2018-04-21 DIAGNOSIS — G4733 Obstructive sleep apnea (adult) (pediatric): Secondary | ICD-10-CM | POA: Diagnosis not present

## 2018-04-24 ENCOUNTER — Other Ambulatory Visit: Payer: Self-pay

## 2018-04-24 ENCOUNTER — Encounter (HOSPITAL_COMMUNITY): Payer: Self-pay

## 2018-04-24 ENCOUNTER — Emergency Department (HOSPITAL_COMMUNITY)
Admission: EM | Admit: 2018-04-24 | Discharge: 2018-04-24 | Disposition: A | Discharge status: Home / Self Care | Payer: Medicare Other | Attending: Emergency Medicine | Admitting: Emergency Medicine

## 2018-04-24 ENCOUNTER — Encounter

## 2018-04-24 DIAGNOSIS — J0101 Acute recurrent maxillary sinusitis: Secondary | ICD-10-CM | POA: Insufficient documentation

## 2018-04-24 DIAGNOSIS — Z79899 Other long term (current) drug therapy: Secondary | ICD-10-CM | POA: Diagnosis not present

## 2018-04-24 DIAGNOSIS — R5383 Other fatigue: Secondary | ICD-10-CM | POA: Diagnosis present

## 2018-04-24 DIAGNOSIS — J45909 Unspecified asthma, uncomplicated: Secondary | ICD-10-CM | POA: Insufficient documentation

## 2018-04-24 DIAGNOSIS — F840 Autistic disorder: Secondary | ICD-10-CM | POA: Insufficient documentation

## 2018-04-24 LAB — URINALYSIS, ROUTINE W REFLEX MICROSCOPIC
BACTERIA UA: NONE SEEN
Bilirubin Urine: NEGATIVE
Glucose, UA: NEGATIVE mg/dL
Hgb urine dipstick: NEGATIVE
Ketones, ur: 5 mg/dL — AB
Nitrite: NEGATIVE
Protein, ur: NEGATIVE mg/dL
SPECIFIC GRAVITY, URINE: 1.029 (ref 1.005–1.030)
pH: 5 (ref 5.0–8.0)

## 2018-04-24 MED ORDER — LEVOFLOXACIN 750 MG PO TABS: 750.0000 mg | ORAL_TABLET | Freq: Every day | ORAL | 0 refills | Status: DC

## 2018-04-24 NOTE — ED Provider Notes (Signed)
[facility]-EMERGENCY DEPT Provider Note   CSN: [id number] Arrival date & time: 04/24/18  0002     History   Chief Complaint Chief Complaint  Patient presents with  . Fatigue    HPI Lori Miles is a 23 y.o. female.  Patient to ED with mother who reports the patient has a behavior change, becoming less active and sleeping more. The patient has a history of developmental delay and does not say when she feels bad or has pain. There has been no fever that mom has identified. She reports that her urine is foul smelling. She has a history of recurrent urinary tract infections and is followed by [facility] for same. Mother states her urinalysis is usually negative but that she has been found to have UTI's by her urologist after a negative test result, and is usually treated with an antibiotic.  Last regimen ended just over one week ago. Mom is also concerned about nasal congestion, and redness and puffiness to face with recurrent sinus infections. No vomiting, diarrhea.   The history is provided by the patient and a parent. No language interpreter was used.    Past Medical History:  Diagnosis Date  . Asthma   . Autism    mom states no actual dx, but admits she has some symptoms  . Bacteriuria   . Chronic constipation   . Cognitive developmental delay   . Disruptive behavior disorder   . Family history of adverse reaction to anesthesia    mother had itching after anesthesia one time  . Frequency-urgency syndrome   . Gait disorder    ORGANIC PER NERUOLGIST NOTE (DR HICKLING)  . 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 GERD (gastroesophageal reflux disease)   . Gout   . H/O sinus tachycardia   . History of acute respiratory failure 02/14/2015   SECONDARY TO CAP  . History of recurrent UTIs   . Interstitial cystitis   . Moderate intellectual disability   . OSA (obstructive sleep apnea)    no machine yet   . Partial epilepsy  with impairment of consciousness (HCC)    FOLLOWED BY DR HICKLING  . Pneumonia 2018 last time  . PONV (postoperative nausea and vomiting)   . Seizure (HCC)    most recent sz " at the end of summer"-last sz years ago per mother  . Urinary incontinence     Patient Active Problem List   Diagnosis Date Noted  . Dysuria 02/20/2018  . Allergic rhinitis 04/03/2017  . Recurrent falls 03/21/2017  . Weakness 03/21/2017  . Venous insufficiency 03/21/2017  . Excessive daytime sleepiness 03/05/2016  . 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 Cough variant asthma vs UACS/ vcd  03/15/2015  . Autism spectrum disorder 02/14/2015  . Nausea with vomiting 02/14/2015  . Obesity, morbid (HCC) 12/13/2014  . 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 Acanthosis nigricans 11/14/2014  . Toeing-in 08/29/2014  . 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 Recurrent urinary tract infection 08/01/2011  . Asthma 08/01/2011  . Chronic constipation 08/01/2011  . Contraception 08/01/2011    Past Surgical History:  Procedure Laterality Date  . CYSTOSCOPY N/A 06/03/2017   Procedure: CYSTOSCOPY WITH EXAM UNDER ANESTHESIA;  Surgeon: Jerilee Field, MD;  [facility];  Service: Urology;  Laterality: N/A;  . CYSTOSCOPY WITH HYDRODISTENSION AND BIOPSY  04/14/2012   Procedure: CYSTOSCOPY/BIOPSY/HYDRODISTENSION;  Surgeon: Lindaann SloughMarc-Henry Nesi, MD;  Location: [facility];  Service: Urology;;  instillation of marcaine and pyridium  . EUA/  PAP SMEAR/  BREAST EXAM  03-12-2016   [facility]  . EUA/ VAGINOSCOPY/ COLPOSCOPY FO THE HYMENAL RING  10-04-2009    dr Tam[facility]son[facility]moore  [facility]  . TONSILLECTOMY       OB History   No obstetric history on file.      Home  Medications    Prior to Admission medications   Medication Sig Start Date End Date Taking? Authorizing Provider  acetaminophen (TYLENOL) 325 MG tablet Take 2 tablets (650 mg total) by mouth every 6 (six) hours as needed for mild pain or moderate pain. 02/27/17  Yes Everlene Farrieransie, William, PA-C  albuterol (PROVENTIL) (2.5 MG/3ML) 0.083% nebulizer solution INHALE CONTENTS OF 1 VIAL IN NEBULIZER EVERY 4 TO 6 HOURS IF NEEDED FOR COUGH OR WHEEZING 03/17/18  Yes Kozlow, Alvira PhilipsEric J, MD  benzonatate (TESSALON) 100 MG capsule Take 2 capsules (200 mg total) by mouth 2 (two) times daily as needed for cough. 12/18/17  Yes Bast, Traci A, NP  budesonide (PULMICORT) 1 MG/2ML nebulizer solution INHALE 2 ML VIA NEBULIZER TWICE DAILY Patient taking differently: Take 1 mg by nebulization 2 (two) times daily.  04/20/18  Yes Kozlow, Alvira PhilipsEric J, MD  cetirizine (ZYRTEC) 10 MG tablet Take 1 tablet (10 mg total) by mouth daily. 03/16/18  Yes Kozlow, Alvira PhilipsEric J, MD  cloNIDine (CATAPRES) 0.1 MG tablet TAKE 1 AND 1/2 TABLET BY MOUTH AT BEDTIME Patient taking differently: Take 0.15 mg by mouth at bedtime.  02/20/18  Yes Goodpasture, [name], NP  DEPAKOTE 500 MG DR tablet TAKE 1 TABLET BY MOUTH IN THE MORNING AND 2 AT NIGHT Patient taking differently: Take 500-1,000 mg by mouth as directed. TAKE 1 (500 MG) TABLET BY MOUTH IN THE MORNING AND 2 (1000 MG) tablet AT NIGHT 03/16/18  Yes Goodpasture, [name], NP  famotidine (PEPCID) 20 MG tablet Take 1 tablet (20 mg total) by mouth every 12 (twelve) hours. Pharmacy-please d/c rx for ranitidine 03/19/18  Yes Pyrtle, Carie CaddyJay M, MD  formoterol (PERFOROMIST) 20 MCG/2ML nebulizer solution Take 2 mLs (20 mcg total) by nebulization 2 (two) times daily. 03/16/18  Yes Kozlow, Alvira PhilipsEric J, MD  Levonorgestrel-Ethinyl Estradiol (SEASONIQUE) 0.15-0.03 &0.01 MG tablet Take 1 tablet by mouth daily. 01/29/18  Yes Sharyon Cableogers, Veronica C, CNM  Melatonin 3 MG TABS Take 3 mg by mouth at bedtime as needed (for sleep).    Yes [provider]  mometasone (NASONEX) 50 MCG/ACT nasal spray Place 2 sprays into the nose daily. 03/17/18  Yes Kozlow, Alvira PhilipsEric J, MD  montelukast (SINGULAIR) 10 MG tablet Take 1 tablet (10 mg total) by mouth at bedtime. 03/16/18  Yes Kozlow, Alvira PhilipsEric J, MD  ondansetron (ZOFRAN ODT) 4 MG disintegrating tablet Take 1 tablet (4 mg total) by mouth every 8 (eight) hours as needed for nausea or vomiting. 04/06/18  Yes Melene PlanFloyd, Dan, DO  polyethylene glycol powder (GLYCOLAX/MIRALAX) powder Dissolve 17 grasm in at least 8 ounces water/juice and drink by mouth twice daily Patient taking differently: Take 17 g by mouth daily as needed for mild constipation.  02/18/18  Yes Pyrtle, Carie CaddyJay M, MD  Revefenacin (YUPELRI) 175 MCG/3ML SOLN Inhale 1 vial into the lungs daily. 03/19/18  Yes Kozlow, Alvira PhilipsEric J, MD  senna (SENOKOT) 8.6 MG tablet Take 1 tablet by mouth at bedtime.  Yes [provider]  traZODone (DESYREL) 50 MG tablet Take 2 tablets (100 mg total) by mouth at bedtime. 02/20/18  Yes Elveria RisingGoodpasture, Tina, NP  levofloxacin (LEVAQUIN) 750 MG tablet Take 1 tablet (750 mg total) by mouth daily. X 7 days Patient not taking: Reported on 04/24/2018 03/14/18   Mesner, Barbara CowerJason, MD    Family History Family History  Problem Relation Age of Onset  . Seizures Mother   . Seizures Sister        1 sister has  . Pancreatic cancer Sister   . Colon cancer Neg Hx   . Colon polyps Neg Hx   . Esophageal cancer Neg Hx   . Rectal cancer Neg Hx   . Stomach cancer Neg Hx     Social History Social History   Tobacco Use  . Smoking status: Never Smoker  . Smokeless tobacco: Never Used  Substance Use Topics  . Alcohol use: No    Alcohol/week: 0.0 standard drinks  . Drug use: No     Allergies   Abilify [aripiprazole]; Seroquel [quetiapine fumarate]; Bactrim [sulfamethoxazole-trimethoprim]; Doxycycline; Amoxicillin-pot clavulanate; and Keppra [levetiracetam]   Review of Systems Review of Systems  Constitutional: Positive for activity change  and fatigue. Negative for chills and fever.  HENT: Positive for congestion and facial swelling.   Respiratory: Negative.   Cardiovascular: Negative.   Gastrointestinal: Negative.   Genitourinary:       See HPI.  Musculoskeletal: Negative.   Skin: Negative.   Neurological: Positive for weakness (generalized).     Physical Exam Updated Vital Signs BP 100/68 (BP Location: Right Arm)   Pulse 80   Temp 98.7 F (37.1 C) (Oral)   Resp 16   Ht 4' 9.5" (1.461 m)   Wt 83.9 kg   SpO2 99%   BMI 39.34 kg/m   Physical Exam Vitals signs and nursing note reviewed.  Constitutional:      Appearance: She is well-developed. She is obese.     Comments: Awake, alert, answers questions appropriately.  HENT:     Head: Normocephalic.     Comments: There is very mild maxillary facial redness.     Right Ear: Tympanic membrane normal.     Left Ear: Tympanic membrane normal.     Nose: Mucosal edema and congestion present.     Right Sinus: Maxillary sinus tenderness present.     Left Sinus: Maxillary sinus tenderness present.  Neck:     Musculoskeletal: Normal range of motion and neck supple.  Cardiovascular:     Rate and Rhythm: Normal rate and regular rhythm.     Heart sounds: No murmur.  Pulmonary:     Effort: Pulmonary effort is normal.     Breath sounds: Normal breath sounds. No wheezing, rhonchi or rales.  Abdominal:     General: Bowel sounds are normal.     Palpations: Abdomen is soft.     Tenderness: There is no abdominal tenderness. There is no guarding or rebound.  Musculoskeletal: Normal range of motion.  Skin:    General: Skin is warm and dry.     Findings: No rash.  Neurological:     General: No focal deficit present.     Mental Status: She is alert.      ED Treatments / Results  Labs (all labs ordered are listed, but only abnormal results are displayed) Labs Reviewed  URINALYSIS, ROUTINE W REFLEX MICROSCOPIC - Abnormal; Notable for the following components:       Result Value  Ketones, ur 5 (*)    Leukocytes, UA SMALL (*)    All other components within normal limits  URINE CULTURE    EKG None  Radiology No results found.  Procedures Procedures (including critical care time)  Medications Ordered in ED Medications - No data to display   Initial Impression / Assessment and Plan / ED Course  I have reviewed the triage vital signs and the nursing notes.  Pertinent labs & imaging results that were available during my care of the patient were reviewed by me and considered in my medical decision making (see chart for details).     Patient with developmental delay to ED with mom who is concerned about infection having noticed that the patient is less active, sleeping more and generally weak. Mom is concerned about infection. She has recurrent UTI and recurrent sinus infections.  UA is negative. Mom states this sometimes happens. Culture pending. She does have evidence of sinusitis and has history of same. Given that she is having behavioral changes that mom states are indicative of illness, will start on Levaquin for sinus infection. She is instructed to follow up with her doctor for recheck this week.   Final Clinical Impressions(s) / ED Diagnoses   Final diagnoses:  None   1. acute sinusitis  ED Discharge Orders    None       Elpidio Anis, PA-C 04/24/18 0604    Sabas Sous, MD 04/24/18 [phone]

## 2018-04-24 NOTE — ED Triage Notes (Addendum)
Pt BIB mother. Per mother, pt is weak, fatigued, and not acting herself. Mother states that patient gets this way when she gets real sick. Pt has special needs and mother is legal guardian. Pt has an extensive hx.

## 2018-04-24 NOTE — Discharge Instructions (Addendum)
Follow up with your doctor for recheck this week. Take Levaquin as directed. The urine test did not show infection tonight but there is a culture pending that can be followed up with your urologist. If you have any urine infection starting, it will be covered with Levaquin being used to treat sinus infection.

## 2018-04-26 LAB — URINE CULTURE: Culture: 20000 — AB

## 2018-04-27 ENCOUNTER — Encounter: Payer: Self-pay | Admitting: Physical Therapy

## 2018-04-27 ENCOUNTER — Ambulatory Visit: Payer: Medicare Other | Attending: Family | Admitting: Physical Therapy

## 2018-04-27 DIAGNOSIS — R2681 Unsteadiness on feet: Secondary | ICD-10-CM | POA: Insufficient documentation

## 2018-04-27 DIAGNOSIS — M6281 Muscle weakness (generalized): Secondary | ICD-10-CM | POA: Diagnosis not present

## 2018-04-27 DIAGNOSIS — R2689 Other abnormalities of gait and mobility: Secondary | ICD-10-CM

## 2018-04-27 NOTE — Therapy (Signed)
[facility] [address] [place], [place], [address] Phone: [phone]   Fax:  [phone]  Physical Therapy Treatment  Patient Details  Name: Lori Miles MRN: [id number] Date of Birth: 01-29-1996 Referring Provider (PT): Elveria Rising, NP   Encounter Date: 04/27/2018  PT End of Session - 04/27/18 1905    Visit Number  2    Date for PT Re-Evaluation  05/22/18    Authorization Type  [employer] Medicare    Authorization Time Period  04-14-18 - 07-11-18    PT Start Time  0802    PT Stop Time  0846    PT Time Calculation (min)  44 min    Equipment Utilized During Treatment  Gait belt       Past Medical History:  Diagnosis Date  . Asthma   . Autism    mom states no actual dx, but admits she has some symptoms  . Bacteriuria   . Chronic constipation   . Cognitive developmental delay   . Disruptive behavior disorder   . Family history of adverse reaction to anesthesia    mother had itching after anesthesia one time  . Frequency-urgency syndrome   . Gait disorder    ORGANIC PER NERUOLGIST NOTE (DR HICKLING)  . 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 GERD (gastroesophageal reflux disease)   . Gout   . H/O sinus tachycardia   . History of acute respiratory failure 02/14/2015   SECONDARY TO CAP  . History of recurrent UTIs   . Interstitial cystitis   . Moderate intellectual disability   . OSA (obstructive sleep apnea)    no machine yet   . Partial epilepsy with impairment of consciousness (HCC)    FOLLOWED BY DR HICKLING  . Pneumonia 2018 last time  . PONV (postoperative nausea and vomiting)   . Seizure (HCC)    most recent sz " at the end of summer"-last sz years ago per mother  . Urinary incontinence     Past Surgical History:  Procedure Laterality Date  . CYSTOSCOPY N/A 06/03/2017   Procedure: CYSTOSCOPY WITH EXAM UNDER ANESTHESIA;  Surgeon: Jerilee Field, MD;  Location:  [facility];  Service: Urology;  Laterality: N/A;  . CYSTOSCOPY WITH HYDRODISTENSION AND BIOPSY  04/14/2012   Procedure: CYSTOSCOPY/BIOPSY/HYDRODISTENSION;  Surgeon: Lindaann Slough, MD;  Location: [facility];  Service: Urology;;  instillation of marcaine and pyridium  . EUA/  PAP SMEAR/  BREAST EXAM  03-12-2016   dr Erin Fulling [facility]  . EUA/ VAGINOSCOPY/ COLPOSCOPY FO THE HYMENAL RING  10-04-2009    dr Tamela Oddi  [facility]  . TONSILLECTOMY      There were no vitals filed for this visit.  Subjective Assessment - 04/27/18 1855    Subjective  Mother reports pt is now on Levaquin antibiotic for sinus infection - went to [facility] last Thursday night; mother states that pt has not had any "near paralysis" episodes    Patient is accompained by:  Family member    Pertinent History  Cognitive developmentally delayed:  recurrent falls:  weakness:  tremor, unspecified:  generalized convulsive epilepsy;  autism spectrum disorder    Patient Stated Goals  improve walking and try to stop the tremors    Currently in Pain?  Other (Comment)   pt unable to verbalize pain - occasionally points to left knee and lower leg region when asked if she is having pain  [facility] Adult PT Treatment/Exercise - 04/27/18 0826      Ambulation/Gait   Ambulation/Gait  Yes    Ambulation/Gait Assistance  4: Min guard    Ambulation Distance (Feet)  115 Feet   20', 10', 15' with min assist   Assistive device  None    Gait Pattern  Step-through pattern   LLE internally rotates with foot flat contact at stance   Ambulation Surface  Level;Indoor    Gait Comments  mother brought custom orthotics to PT appt; these orthotics were placed in pt's shoes - worn during gait training      Self Care; discussed symptoms of tremors/LE instability with knees buckling with pt's mother;  Questioned if some of this could Be behavioral as it occurred prior to 2nd rep of gait  training with pt wanting to lie down on mat after initial rep of gait training  Recommended to pt's mother that the custom orthotics be worn in pt's shoes (shoe insole was removed on each shoe)   Attempted to manual muscle test quads but pt unable to follow direction to maintain knee extended in order to perform accurate  Manual muscle test; also attempted to test strength of Lt hamstrings but pt unable to follow direction to keep Lt knee flexed and resist Movement          PT Long Term Goals - 04/15/18 2002      PT LONG TERM GOAL #1   Title  Pt. will amb. 500' on flat, even surface without LOB.      Time  4    Period  Weeks    Status  New    Target Date  05/22/18      PT LONG TERM GOAL #2   Title  Pt will perform at least 10" on recumbent bike nonstop for improved endurance/activity tolerance.    Time  4    Period  Weeks    Status  New    Target Date  05/22/18      PT LONG TERM GOAL #3   Title  Mother will verbalize understanding of HEP to assist with LE strengthening.  (11-20-14)    Time  4    Period  Weeks    Status  New    Target Date  05/22/18      PT LONG TERM GOAL #4   Title  Pt. will perform 10" nonstop on bike to demo improved endurance/activity tolerance.  (11-20-14)    Time  4    Status  New    Target Date  05/22/18            Plan - 04/27/18 1905    Clinical Impression Statement  Pt demonstrated no tremors or LOB on initial rep of gait training at beginning of session; pt stood from mat to begin 2nd rep and legs began tremoring so pt sat back down.  Pt stood again and knees buckled with pt returning to sitting on mat table.  Pt gait trained again with 2nd person following with wheelchair for safety - pt amb. 20' with min. tremors noted in bil. LE's. Mother attempted to video the tremors on her Ipad but it did not tape.  3rd rep of gait training 10' with no tremors noted on this rep.  Etiology of tremors/LE instability unknown.  Rehab Potential  Good    PT Frequency  1x / week    PT Duration  4 weeks    PT Treatment/Interventions  ADLs/Self Care Home Management;DME Instruction;Gait training;Stair training;Orthotic Fit/Training;Patient/family education;Neuromuscular re-education;Balance training;Therapeutic exercise;Therapeutic activities    PT Next Visit Plan  HEP for LE strengthening       Patient will benefit from skilled therapeutic intervention in order to improve the following deficits and impairments:  Abnormal gait, Decreased balance, Decreased strength, Decreased activity tolerance, Decreased endurance, Decreased safety awareness, Pain, Decreased coordination  Visit Diagnosis: Other abnormalities of gait and mobility  Unsteadiness on feet     Problem List Patient Active Problem List   Diagnosis Date Noted  . Dysuria 02/20/2018  . Allergic rhinitis 04/03/2017  . Recurrent falls 03/21/2017  . Weakness 03/21/2017  . Venous insufficiency 03/21/2017  . Excessive daytime sleepiness 03/05/2016  . 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 Cough variant asthma vs UACS/ vcd  03/15/2015  . Autism spectrum disorder 02/14/2015  . Nausea with vomiting 02/14/2015  . Obesity, morbid (HCC) 12/13/2014  . 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 Acanthosis nigricans 11/14/2014  . Toeing-in 08/29/2014  . 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 Recurrent urinary tract infection 08/01/2011  . Asthma 08/01/2011  . Chronic constipation 08/01/2011  . Contraception 08/01/2011    Kary Kos, PT  04/27/2018, 7:13 PM  [facility] [address] [place], [place], [address] Phone: [phone]   Fax:   [phone]  Name: SHEREA FARNELL MRN: [id number] Date of Birth: 09/05/1995

## 2018-04-28 ENCOUNTER — Telehealth: Payer: Self-pay | Admitting: Pulmonary Disease

## 2018-04-28 ENCOUNTER — Emergency Department (HOSPITAL_COMMUNITY): Payer: Medicare Other

## 2018-04-28 ENCOUNTER — Telehealth: Payer: Self-pay | Admitting: Allergy and Immunology

## 2018-04-28 ENCOUNTER — Encounter (HOSPITAL_COMMUNITY): Payer: Self-pay | Admitting: Emergency Medicine

## 2018-04-28 ENCOUNTER — Telehealth: Payer: Self-pay | Admitting: Internal Medicine

## 2018-04-28 ENCOUNTER — Encounter (INDEPENDENT_AMBULATORY_CARE_PROVIDER_SITE_OTHER): Payer: Self-pay | Admitting: Family

## 2018-04-28 ENCOUNTER — Emergency Department (HOSPITAL_COMMUNITY)
Admission: EM | Admit: 2018-04-28 | Discharge: 2018-04-29 | Disposition: A | Discharge status: Home / Self Care | Payer: Medicare Other | Attending: Emergency Medicine | Admitting: Emergency Medicine

## 2018-04-28 ENCOUNTER — Other Ambulatory Visit: Payer: Self-pay

## 2018-04-28 ENCOUNTER — Ambulatory Visit (INDEPENDENT_AMBULATORY_CARE_PROVIDER_SITE_OTHER): Payer: Medicare Other | Admitting: Family

## 2018-04-28 VITALS — BP 120/78 | HR 82

## 2018-04-28 DIAGNOSIS — R1110 Vomiting, unspecified: Secondary | ICD-10-CM

## 2018-04-28 DIAGNOSIS — R569 Unspecified convulsions: Secondary | ICD-10-CM | POA: Diagnosis not present

## 2018-04-28 DIAGNOSIS — F71 Moderate intellectual disabilities: Secondary | ICD-10-CM | POA: Diagnosis not present

## 2018-04-28 DIAGNOSIS — R0981 Nasal congestion: Secondary | ICD-10-CM | POA: Insufficient documentation

## 2018-04-28 DIAGNOSIS — G40209 Localization-related (focal) (partial) symptomatic epilepsy and epileptic syndromes with complex partial seizures, not intractable, without status epilepticus: Secondary | ICD-10-CM

## 2018-04-28 DIAGNOSIS — R0602 Shortness of breath: Secondary | ICD-10-CM | POA: Insufficient documentation

## 2018-04-28 DIAGNOSIS — R220 Localized swelling, mass and lump, head: Secondary | ICD-10-CM | POA: Insufficient documentation

## 2018-04-28 DIAGNOSIS — J45909 Unspecified asthma, uncomplicated: Secondary | ICD-10-CM | POA: Diagnosis not present

## 2018-04-28 DIAGNOSIS — R5383 Other fatigue: Secondary | ICD-10-CM | POA: Insufficient documentation

## 2018-04-28 DIAGNOSIS — Z79899 Other long term (current) drug therapy: Secondary | ICD-10-CM | POA: Diagnosis not present

## 2018-04-28 DIAGNOSIS — R509 Fever, unspecified: Secondary | ICD-10-CM | POA: Diagnosis not present

## 2018-04-28 DIAGNOSIS — F819 Developmental disorder of scholastic skills, unspecified: Secondary | ICD-10-CM

## 2018-04-28 DIAGNOSIS — R531 Weakness: Secondary | ICD-10-CM | POA: Insufficient documentation

## 2018-04-28 DIAGNOSIS — F840 Autistic disorder: Secondary | ICD-10-CM | POA: Diagnosis not present

## 2018-04-28 DIAGNOSIS — R112 Nausea with vomiting, unspecified: Secondary | ICD-10-CM | POA: Diagnosis not present

## 2018-04-28 DIAGNOSIS — G40309 Generalized idiopathic epilepsy and epileptic syndromes, not intractable, without status epilepticus: Secondary | ICD-10-CM

## 2018-04-28 LAB — COMPREHENSIVE METABOLIC PANEL
ALT: 12 U/L (ref 0–44)
AST: 16 U/L (ref 15–41)
Albumin: 2.7 g/dL — ABNORMAL LOW (ref 3.5–5.0)
Alkaline Phosphatase: 37 U/L — ABNORMAL LOW (ref 38–126)
Anion gap: 11 (ref 5–15)
BUN: 11 mg/dL (ref 6–20)
CO2: 21 mmol/L — ABNORMAL LOW (ref 22–32)
Calcium: 8.5 mg/dL — ABNORMAL LOW (ref 8.9–10.3)
Chloride: 104 mmol/L (ref 98–111)
Creatinine, Ser: 0.87 mg/dL (ref 0.44–1.00)
GFR calc Af Amer: >60 mL/min (ref >60)
GFR calc non Af Amer: >60 mL/min (ref >60)
Glucose, Bld: 186 mg/dL — ABNORMAL HIGH (ref 70–99)
Potassium: 3.9 mmol/L (ref 3.5–5.1)
Sodium: 136 mmol/L (ref 135–145)
Total Bilirubin: 0.4 mg/dL (ref 0.3–1.2)
Total Protein: 5.5 g/dL — ABNORMAL LOW (ref 6.5–8.1)

## 2018-04-28 LAB — URINALYSIS, ROUTINE W REFLEX MICROSCOPIC
Bilirubin Urine: NEGATIVE
Glucose, UA: NEGATIVE mg/dL
HGB URINE DIPSTICK: NEGATIVE
Ketones, ur: 5 mg/dL — AB
Nitrite: NEGATIVE
Protein, ur: NEGATIVE mg/dL
Specific Gravity, Urine: 1.031 — ABNORMAL HIGH (ref 1.005–1.030)
pH: 5 (ref 5.0–8.0)

## 2018-04-28 LAB — CBC
HCT: 35.9 % — ABNORMAL LOW (ref 36.0–46.0)
Hemoglobin: 11.3 g/dL — ABNORMAL LOW (ref 12.0–15.0)
MCH: 29.6 pg (ref 26.0–34.0)
MCHC: 31.5 g/dL (ref 30.0–36.0)
MCV: 94 fL (ref 80.0–100.0)
Platelets: 219 10*3/uL (ref 150–400)
RBC: 3.82 MIL/uL — ABNORMAL LOW (ref 3.87–5.11)
RDW: 14.2 % (ref 11.5–15.5)
WBC: 12.3 10*3/uL — AB (ref 4.0–10.5)
nRBC: 0 % (ref 0.0–0.2)

## 2018-04-28 LAB — I-STAT BETA HCG BLOOD, ED (MC, WL, AP ONLY)

## 2018-04-28 LAB — LIPASE, BLOOD: Lipase: 26 U/L (ref 11–51)

## 2018-04-28 MED ORDER — SODIUM CHLORIDE 0.9% FLUSH: 3.0000 mL | Freq: Once | INTRAVENOUS | Status: DC

## 2018-04-28 NOTE — Progress Notes (Signed)
Patient: Lori Miles MRN: [id number] Sex: female DOB: 1995-07-24  Provider: Elveria Rising, NP Location of Care: [facility]  Note type: Routine return visit  History of Present Illness: Referral Source: Fleet Contras, MD History from: mother and [facility] chart Chief Complaint: seizure  Lori Miles is a 23 y.o. with history of generalized convulsive and partial epilepsy, intellectual disabilities, autism spectrum disorder and disordered sleep. She was last seen March 24, 2018. Elexys is taking and tolerating Depakote and has remained seizure free since January 2019. At her last visit Mom was very concerned about Lori Miles's ability to walk and shaking movements of her left side. Physical therapy has been started and Mom reports that [facility] displayed her problems with walking while working with the therapist. She said that the therapist had to use a gait belt because of Lori Miles's weakness and that after walking around the track once, Lori Miles stopped, her legs were shaking and she could not go further. Mom had a video of some of this behavior. The therapist questioned if some of her problems was lack of motivation to walk and behavioral in nature.   Mom has noted fumbling movements of Lori Miles's hands at times and brought a video for review. In the video, Lori Miles was staring and was unresponsive to her name being called. She was moving her fingers in a non-purposeful manner. The video lasted about 30 seconds, then Lori Miles blinked and looked toward her mother. The fumbling movements of her fingers stopped. Prior to that occurring, Mom notes that she was holding a cup and spilled it. Mom says that this behavior occurs at least once or twice per week.   Mom continues to be very concerned that something is wrong with Lori Miles that has not yet been diagnosed, and is being pressured by her family to push for further diagnostic testing to be performed. Lori Miles has been otherwise  generally healthy since she was last seen. Mom has no other health concerns for [facility] other than previously mentioned.   Review of Systems: Please see the HPI for neurologic and other pertinent review of systems. Otherwise, all other systems were reviewed and were negative.    Past Medical History:  Diagnosis Date  . Asthma   . Autism    mom states no actual dx, but admits she has some symptoms  . Bacteriuria   . Chronic constipation   . Cognitive developmental delay   . Disruptive behavior disorder   . Family history of adverse reaction to anesthesia    mother had itching after anesthesia one time  . Frequency-urgency syndrome   . Gait disorder    ORGANIC PER NERUOLGIST NOTE (DR HICKLING)  . 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 GERD (gastroesophageal reflux disease)   . Gout   . H/O sinus tachycardia   . History of acute respiratory failure 02/14/2015   SECONDARY TO CAP  . History of recurrent UTIs   . Interstitial cystitis   . Moderate intellectual disability   . OSA (obstructive sleep apnea)    no machine yet   . Partial epilepsy with impairment of consciousness (HCC)    FOLLOWED BY DR HICKLING  . Pneumonia 2018 last time  . PONV (postoperative nausea and vomiting)   . Seizure (HCC)    most recent sz " at the end of summer"-last sz years ago per mother  . Urinary incontinence    Hospitalizations: No., Head Injury: No., Nervous System Infections: No., Immunizations up to  date: Yes.   Past Medical History Comments: see HPI Copied from previous record: EEG on September 14, 2011, was normal. She was able to be successfully weaned off Keppra. The patient has significant issues with aggression, which was thought to be related to Keppra, but has persisted once it was discontinued. The patient was treated with Depakote both for behavior and possible seizures. She had palpitations and severe constipation on Seroquel. She had marked weight gain on Abilify  and Depakote. When generic Divalproex was tried she had breakthrough seizures.   Nocturnal polysomnogram on October 27, 2011, failed to show significant periodic limb movements, cyanosis, sleep apnea, or cardiac arrhythmia. EEG on February 20, 2012, was a normal study with the patient awake.   EEG performed March 25, 2017 was normal  Surgical History Past Surgical History:  Procedure Laterality Date  . CYSTOSCOPY N/A 06/03/2017   Procedure: CYSTOSCOPY WITH EXAM UNDER ANESTHESIA;  Surgeon: Lori Field, MD;  Location: [facility];  Service: Urology;  Laterality: N/A;  . CYSTOSCOPY WITH HYDRODISTENSION AND BIOPSY  04/14/2012   Procedure: CYSTOSCOPY/BIOPSY/HYDRODISTENSION;  Surgeon: Lori Slough, MD;  Location: [facility];  Service: Urology;;  instillation of marcaine and pyridium  . EUA/  PAP SMEAR/  BREAST EXAM  03-12-2016   dr Lori Miles [facility]  . EUA/ VAGINOSCOPY/ COLPOSCOPY FO THE HYMENAL RING  10-04-2009    dr Lori Miles  [facility]  . TONSILLECTOMY      Family History family history includes Pancreatic cancer in her sister; Seizures in her mother and sister. Family History is otherwise negative for migraines, seizures, cognitive impairment, blindness, deafness, birth defects, chromosomal disorder, autism.  Social History Social History   Socioeconomic History  . Marital status: Single    Spouse name: Not on file  . Number of children: Not on file  . Years of education: Not on file  . Highest education level: Not on file  Occupational History  . Not on file  Social Needs  . Financial resource strain: Not on file  . Food insecurity:    Worry: Not on file    Inability: Not on file  . Transportation needs:    Medical: Not on file    Non-medical: Not on file  Tobacco Use  . Smoking status: Never Smoker  . Smokeless tobacco: Never Used  Substance and Sexual Activity  . Alcohol use: No    Alcohol/week: 0.0 standard drinks  . Drug use: No   . Sexual activity: Never    Birth control/protection: Pill  Lifestyle  . Physical activity:    Days per week: Not on file    Minutes per session: Not on file  . Stress: Not on file  Relationships  . Social connections:    Talks on phone: Not on file    Gets together: Not on file    Attends religious service: Not on file    Active member of club or organization: Not on file    Attends meetings of clubs or organizations: Not on file    Relationship status: Not on file  Other Topics Concern  . Not on file  Social History Narrative   Lives with mom ([name] Thetford) and half-sister Autie Vasudevan) who is also mentally impaired.  Has CAP assistance, urinary incontinence, needs help with feeding,dressing, toileting   Graduated from [employer].  She enjoys playing with cars, dancing, and dogs.    Allergies Allergies  Allergen Reactions  . Abilify [Aripiprazole] Swelling and Palpitations  . Seroquel [  Quetiapine Fumarate] Palpitations  . Bactrim [Sulfamethoxazole-Trimethoprim] Other (See Comments)    Abdominal pain  . Doxycycline Other (See Comments)    Stomach cramps  . Amoxicillin-Pot Clavulanate Hives    Has patient had a PCN reaction causing immediate rash, facial/tongue/throat swelling, SOB or lightheadedness with hypotension: No Has patient had a PCN reaction causing severe rash involving mucus membranes or skin necrosis: No Has patient had a PCN reaction that required hospitalization: No Has patient had a PCN reaction occurring within the last 10 years: No If all of the above answers are "NO", then may proceed with Cephalosporin use.   Marland Kitchen Keppra [Levetiracetam] Other (See Comments)    Aggressive behavior     Physical Exam BP 120/78   Pulse 82  Patient refused height and weight measurement General: well developed, well nourished obese woman, seated in exam room, in no evident distress; black hair, brown eyes, right handed Head: normocephalic and atraumatic.  Oropharynx benign. No dysmorphic features. Neck: supple with no carotid bruits. Cardiovascular: regular rate and rhythm, no murmurs. Respiratory: Clear to auscultation bilaterally Abdomen: Bowel sounds present all four quadrants, abdomen soft, non-tender, non-distended. No hepatosplenomegaly or masses palpated. Musculoskeletal: No skeletal deformities or obvious scoliosis.  Skin: no rashes or neurocutaneous lesions  Neurologic Exam Mental Status: Awake and fully alert. Language is limited.  Smiles responsively. Has variable eye contact. Engaged in watching a video on her tablet and was resistant to invasions in to her space. Had difficulty following commands and instructions today because she wanted to look at the tablet continuously. Cranial Nerves: Fundoscopic exam - red reflex present.  Unable to fully visualize fundus.  Pupils equal briskly reactive to light.  Turns to localize faces and objects in the periphery. Turns to localize sounds in the periphery. Facial movements are symmetric, has lower facial weakness with drooling.  Motor: Normal functional bulk, tone and strength. She had no shaking of her extremities today Sensory: Withdrawal x 4 Coordination: Unable to adequately assess due to patient's inability to participate in examination. No dysmetria when reaching for objects. Gait and Station: Aable to independently stand and bear weight. She turns left toe inward while walking. When allowed to hold the tablet and walk, she did quite well, even walking in rhythm to the music at times. Reflexes: Diminished and symmetric. Toes neutral. No clonus  Impression 1.  Partial epilepsy with impairment of consciousness 2.  Generalized convulsive epilepsy 3.  Intellectual disability 4.  Insomnia and sleep arousals 5.  Gait disorder 6.  Intermittent tremors 7. Autism spectrum disorder 8.  At risk for falls.    Recommendations for plan of care The patient's previous [facility] records were  reviewed. Evelyna has neither had nor required imaging or lab studies since the last visit. She is a 23 year old young woman with history of epilepsy, intellectual disability, autism spectrum disorder, insomnia, gait disorder and intermittent tremors. She is taking and tolerating Depakote and has remained seizure free since January 2019. The video that Mom provided today was concerning for complex partial seizure. I recommended an EEG and Mom agreed. Jadelynn's gait is better today while she is distracted looking at her tablet. I will contact her physical therapist to talk with her about Mayerly's condition. I will otherwise see Quyen back in follow up in 1 month or sooner if needed.   The medication list was reviewed and reconciled.  No changes were made in the prescribed medications today.  A complete medication list was provided to her mother.  Allergies as of 04/28/2018      Reactions   Abilify [aripiprazole] Swelling, Palpitations   Seroquel [quetiapine Fumarate] Palpitations   Bactrim [sulfamethoxazole-trimethoprim] Other (See Comments)   Abdominal pain   Doxycycline Other (See Comments)   Stomach cramps   Amoxicillin-pot Clavulanate Hives   Has patient had a PCN reaction causing immediate rash, facial/tongue/throat swelling, SOB or lightheadedness with hypotension: No Has patient had a PCN reaction causing severe rash involving mucus membranes or skin necrosis: No Has patient had a PCN reaction that required hospitalization: No Has patient had a PCN reaction occurring within the last 10 years: No If all of the above answers are "NO", then may proceed with Cephalosporin use.   Keppra [levetiracetam] Other (See Comments)   Aggressive behavior       Medication List       Accurate as of April 28, 2018  7:41 AM. Always use your most recent med list.        acetaminophen 325 MG tablet Commonly known as:  TYLENOL Take 2 tablets (650 mg total) by mouth every 6 (six) hours as needed  for mild pain or moderate pain.   albuterol (2.5 MG/3ML) 0.083% nebulizer solution Commonly known as:  PROVENTIL INHALE CONTENTS OF 1 VIAL IN NEBULIZER EVERY 4 TO 6 HOURS IF NEEDED FOR COUGH OR WHEEZING   benzonatate 100 MG capsule Commonly known as:  TESSALON Take 2 capsules (200 mg total) by mouth 2 (two) times daily as needed for cough.   budesonide 1 MG/2ML nebulizer solution Commonly known as:  PULMICORT INHALE 2 ML VIA NEBULIZER TWICE DAILY   cetirizine 10 MG tablet Commonly known as:  ZYRTEC Take 1 tablet (10 mg total) by mouth daily.   cloNIDine 0.1 MG tablet Commonly known as:  CATAPRES TAKE 1 AND 1/2 TABLET BY MOUTH AT BEDTIME   DEPAKOTE 500 MG DR tablet Generic drug:  divalproex TAKE 1 TABLET BY MOUTH IN THE MORNING AND 2 AT NIGHT   famotidine 20 MG tablet Commonly known as:  PEPCID Take 1 tablet (20 mg total) by mouth every 12 (twelve) hours. Pharmacy-please d/c rx for ranitidine   formoterol 20 MCG/2ML nebulizer solution Commonly known as:  PERFOROMIST Take 2 mLs (20 mcg total) by nebulization 2 (two) times daily.   levofloxacin 750 MG tablet Commonly known as:  LEVAQUIN Take 1 tablet (750 mg total) by mouth daily. X 7 days   Levonorgestrel-Ethinyl Estradiol 0.15-0.03 &0.01 MG tablet Commonly known as:  SEASONIQUE Take 1 tablet by mouth daily.   Melatonin 3 MG Tabs Take 3 mg by mouth at bedtime as needed (for sleep).   mometasone 50 MCG/ACT nasal spray Commonly known as:  NASONEX Place 2 sprays into the nose daily.   montelukast 10 MG tablet Commonly known as:  SINGULAIR Take 1 tablet (10 mg total) by mouth at bedtime.   ondansetron 4 MG disintegrating tablet Commonly known as:  ZOFRAN ODT Take 1 tablet (4 mg total) by mouth every 8 (eight) hours as needed for nausea or vomiting.   polyethylene glycol powder powder Commonly known as:  GLYCOLAX/MIRALAX Dissolve 17 grasm in at least 8 ounces water/juice and drink by mouth twice daily     Revefenacin 175 MCG/3ML Soln Commonly known as:  YUPELRI Inhale 1 vial into the lungs daily.   senna 8.6 MG tablet Commonly known as:  SENOKOT Take 1 tablet by mouth at bedtime.   traZODone 50 MG tablet Commonly known as:  DESYREL Take 2 tablets (100 mg  total) by mouth at bedtime.       Dr. Sharene SkeansHickling was consulted regarding the patient.   Total time spent with the patient was 25 minutes, of which 50% or more was spent in counseling and coordination of care.   Elveria Risingina Jamie Hafford NP-C

## 2018-04-28 NOTE — Telephone Encounter (Signed)
Called and spoke with patients mother. She stated that the patient was seen by her ENT and the allergist and they have only given her the [facility] for her problems. Patients mother stated that patient is still having issues with sinus infections that eventually turn into pneumonia. Patients mother is requesting that Dr. Craige Cotta provide the patient with another medication. Dr. Lucie Leather will not do surgery for the patient. Patients mother stated that she doesn't think the patient is getting enough oxygen. Asked patients mother if the patient is currently having SOB or trouble breathing and she said no. She just thinks that the patient "has it sometimes". Before I could ask any other questions patients mother stated that she had to go because someone else was calling but she wanted the patient to have a new breathing machine. Tried to call patients mother back and I was unable to reach her. VS please advise, thank you.

## 2018-04-28 NOTE — Telephone Encounter (Signed)
Please inform mom that she missed her 4 weeks follow-up visit.  She needs to be seen tomorrow in this clinic.  We can have her continually going to urgent care or emergency room for her medical care.

## 2018-04-28 NOTE — Patient Instructions (Signed)
Thank you for coming in today.   Instructions for you until your next appointment are as follows: 1. We will schedule an EEG for [facility] at [facility]. I will call you when the EEG has been read.  2.Continue her physical therapy and try to get Mieko to be more active at home 3. Continue her medication without change for now 4. Continue to try to video episodes that are concerning to you 5. Please plan to return for follow up in 1 month or sooner if needed.

## 2018-04-28 NOTE — Telephone Encounter (Signed)
Spoke with mother.  Patient was seen in the ED on 04/24/2018, and diagnosised with a sinus infection.  They started her on Levaquin, but mother states that she believes her symptoms are worse.  States that she believes patient is now also having some wheezing and needs a dose of Prednisone to help.  Please advise if it is okay to send in Prednisone or another plan of action.

## 2018-04-28 NOTE — Telephone Encounter (Signed)
Pt mom called and said that she thinks she needs some predisone to help her. [facility] on [address] [phone].

## 2018-04-28 NOTE — ED Triage Notes (Addendum)
Patient here with fatigue, weak, falling and just not acting herself.  She has been vomiting and seizing.  Pt does have extensive history.  She is wheezing, having facial swelling which is a little bit better than before per Mom.  Patient has been having fevers at home.

## 2018-04-28 NOTE — Telephone Encounter (Signed)
Copied from CRM [id number]. Topic: General - Other >> Apr 28, 2018  3:15 PM Tamela Oddi wrote: Reason for CRM: Patient's mother  called to request that Dr. Frutoso Chase nurse call her regarding her daughter's condition.  CB# [phone]

## 2018-04-29 ENCOUNTER — Ambulatory Visit (INDEPENDENT_AMBULATORY_CARE_PROVIDER_SITE_OTHER): Payer: Medicare Other | Admitting: Allergy

## 2018-04-29 ENCOUNTER — Encounter: Payer: Self-pay | Admitting: Allergy

## 2018-04-29 VITALS — BP 102/65 | HR 120 | Temp 98.5°F | Resp 16 | Ht <= 58 in | Wt 185.0 lb

## 2018-04-29 DIAGNOSIS — B999 Unspecified infectious disease: Secondary | ICD-10-CM

## 2018-04-29 DIAGNOSIS — J4540 Moderate persistent asthma, uncomplicated: Secondary | ICD-10-CM

## 2018-04-29 DIAGNOSIS — K219 Gastro-esophageal reflux disease without esophagitis: Secondary | ICD-10-CM

## 2018-04-29 DIAGNOSIS — R112 Nausea with vomiting, unspecified: Secondary | ICD-10-CM | POA: Diagnosis not present

## 2018-04-29 DIAGNOSIS — J310 Chronic rhinitis: Secondary | ICD-10-CM | POA: Diagnosis not present

## 2018-04-29 MED ORDER — PREDNISONE 20 MG PO TABS
60.0000 mg | ORAL_TABLET | Freq: Once | ORAL | Status: AC
  Administered 2018-04-29: 60 mg via ORAL
  Filled 2018-04-29: qty 3

## 2018-04-29 MED ORDER — BUDESONIDE 1 MG/2ML IN SUSP: 1.0000 mg | Freq: Four times a day (QID) | RESPIRATORY_TRACT | 2 refills | Status: DC

## 2018-04-29 MED ORDER — PREDNISONE 20 MG PO TABS: 40.0000 mg | ORAL_TABLET | Freq: Every day | ORAL | 0 refills | Status: DC

## 2018-04-29 MED ORDER — ONDANSETRON 4 MG PO TBDP
4.0000 mg | ORAL_TABLET | Freq: Once | ORAL | Status: AC
  Administered 2018-04-29: 4 mg via ORAL
  Filled 2018-04-29: qty 1

## 2018-04-29 NOTE — Assessment & Plan Note (Addendum)
Not well controlled asthma. Symptoms flaring with coughing and vomiting the last week. Mother believes she has an URI and noted some fevers but did not measure.   On physical exam lungs were clear. CXR from 04/28/18 was normal. . Start prednisone as 40mg  daily as prescribed for the next 4 days. . Increase budesonide nebulizer to 4 times a day for the next 1-2 weeks. . Pretreat with albuterol nebulizer for the next 3-5 days. Doreatha Martin. Finish Levaquin . Daily controller medication(s): budesonide 1mg  nebulizer twice a day. o Perforomist nebulizer twice a day o Yupelri nebulizer once a day . Prior to physical activity: May use albuterol rescue inhaler 2 puffs 5 to 15 minutes prior to strenuous physical activities. Marland Kitchen. Rescue medications: May use albuterol rescue inhaler 2 puffs or nebulizer every 4 to 6 hours as needed for shortness of breath, chest tightness, coughing, and wheezing. Monitor frequency of use.  . During upper respiratory infections: Start budesonide 1mg  four times a day for 1-2 weeks.   Continue montelukast 10mg  daily.  Continue cetirizine 10mg  daily.  Will check alpha-1 antitrypsin level and rule out CF at next visit.

## 2018-04-29 NOTE — Assessment & Plan Note (Signed)
Past history - 2018 Immunocal was negative with IgE of 7. Interim history -not using nasal sprays daily.  Continue nasonex 1 spray daily.

## 2018-04-29 NOTE — Assessment & Plan Note (Signed)
Past history - normal IgG values and robust response to pneumovax.  Interim history - still having frequent URIs.   Keep track of infections. If persistent, recheck immunoglobulin levels and s pneumo titers.   Recommend annual flu vaccine. Did not receive it this flu season.

## 2018-04-29 NOTE — ED Notes (Addendum)
Pt mother refuses vital. Was told pt would have a room very quickly. Pt mother states pt can be seen over in peds if she can't be seen on the adult side quickly. Informed of CN Tori.

## 2018-04-29 NOTE — Telephone Encounter (Signed)
Called pts mother and had an in depth conversation regarding the patients visit to the ED on 04/28/2018. She was not satisfied with the treatment and did not feel that they were listening to her. States that the patient is not doing well today. Scheduled appt for pt to see Dr. Selena Batten today.

## 2018-04-29 NOTE — Telephone Encounter (Signed)
Patients mother wanted dr Okey Dupre to know that she tried to get appt scheduled for Scotty during her last episode---no available appts with NP or with dr Okey Dupre, so she ended up going to ED----routing to dr Okey Dupre, fyi.Marland KitchenMarland Kitchen

## 2018-04-29 NOTE — Patient Instructions (Addendum)
.   Start prednisone as 40mg  daily as prescribed for the next 4 days. . Increase budesonide nebulizer to 4 times a day for the next 1-2 weeks. . Pretreat with albuterol nebulizer for the next 3-5 days. Doreatha Martin levaquin  . Daily controller medication(s): budesonide 1mg  nebulizer twice a day. o Perforomist nebulizer twice a day o yupelri nebulizer once a day . Prior to physical activity: May use albuterol rescue inhaler 2 puffs 5 to 15 minutes prior to strenuous physical activities. Marland Kitchen Rescue medications: May use albuterol rescue inhaler 2 puffs or nebulizer every 4 to 6 hours as needed for shortness of breath, chest tightness, coughing, and wheezing. Monitor frequency of use.  . During upper respiratory infections: Start budesonide 1mg  four times a day for 1-2 weeks.  . Asthma control goals:  o Full participation in all desired activities (may need albuterol before activity) o Albuterol use two times or less a week on average (not counting use with activity) o Cough interfering with sleep two times or less a month o Oral steroids no more than once a year o No hospitalizations   Continue montelukast 10mg  daily.  Continue cetirizine 10mg  daily.  Continue nasonex 1 spray daily.  Continue pepcid 20mg  twice a day.  Return to clinic in 4 weeks or earlier if problem

## 2018-04-29 NOTE — ED Notes (Addendum)
Pt tolerated sprite without difficulty, no vomiting.  She is walking around her treatment room at this time.

## 2018-04-29 NOTE — Assessment & Plan Note (Signed)
Continue Pepcid 20mg BID.

## 2018-04-29 NOTE — ED Notes (Signed)
Pt mother called operator wanting to speak with myself and AC. She is demanding that if we do not see pt soon that she wants her to be seen in peds. Peds wait time is just as long as adult and peds PA will not see the pt.

## 2018-04-29 NOTE — Discharge Instructions (Signed)
Steroids were prescribed at your request for added management of sinusitis control.  We discussed the risks and benefits of steroid use.  Continue Levaquin as previously prescribed.  Follow-up with your primary care doctor as well as your specialists.

## 2018-04-29 NOTE — ED Provider Notes (Signed)
[facility] Provider Note   CSN: [id number] Arrival date & time: 04/28/18  2110     History   Chief Complaint Chief Complaint  Patient presents with  . Emesis    HPI Lori Miles is a 23 y.o. female.  23 y/o female with hx of cognitive developmental delay, general convulsive epilepsy, GERD presents to the ED with mother. Mother expresses multiple complaints. History is limited by patient's largely nonverbal state given cognitive delay and mother's tangential speech.  Mother states that patient was seen in the ED 5 days ago for sinus infection. She references the patient experiencing facial swelling on either sides of her nose.  She was started on Levaquin which she has been taking.  The mother feels that this has subjectively improved the patient's facial swelling, but has not completely resolved the issue.  Mother reports that her prior ED provider referenced need for potential prednisone, but this was not prescribed.  She is requesting that the patient receive this medication today.  As an aside, mother also feels that her daughter may be experiencing shortness of breath.  She states that sometimes albuterol does not help her asthmatic symptoms on their own and she requires prednisone to aid in breathing as well.  Denies any specific complaints of shortness of breath today, but segways into history of pneumonia secondary to recurrent sinusitis.  Mother then reports the patient has been experiencing ongoing nausea and vomiting today.  She states that she noticed some blood in the patient's emesis at times.  Notes that vomiting is not always controlled by Zofran and she needs to be seen in the emergency department for this.  Mother continues to then state that she believes that the patient may have had a seizure prior to arrival due to a staring spell which occurred after she vomited on one occasion.  Mother references need for ongoing physical  therapy with respect to her seizures.  She was seen for seizure disorder by her neurologist yesterday.  Apparently, the patient needs a gait belt at times for instability; possibly precipitated by seizures, but difficult to discern based on mother's history.  The patient is being seen by PT for this.  The history is provided by a parent (history provided by mother). No language interpreter was used.  Emesis    Past Medical History:  Diagnosis Date  . Asthma   . Autism    mom states no actual dx, but admits she has some symptoms  . Bacteriuria   . Chronic constipation   . Cognitive developmental delay   . Disruptive behavior disorder   . Family history of adverse reaction to anesthesia    mother had itching after anesthesia one time  . Frequency-urgency syndrome   . Gait disorder    ORGANIC PER NERUOLGIST NOTE (DR HICKLING)  . 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 GERD (gastroesophageal reflux disease)   . Gout   . H/O sinus tachycardia   . History of acute respiratory failure 02/14/2015   SECONDARY TO CAP  . History of recurrent UTIs   . Interstitial cystitis   . Moderate intellectual disability   . OSA (obstructive sleep apnea)    no machine yet   . Partial epilepsy with impairment of consciousness (HCC)    FOLLOWED BY DR HICKLING  . Pneumonia 2018 last time  . PONV (postoperative nausea and vomiting)   . Seizure (HCC)    most recent sz " at  the end of summer"-last sz years ago per mother  . Urinary incontinence     Patient Active Problem List   Diagnosis Date Noted  . Dysuria 02/20/2018  . Allergic rhinitis 04/03/2017  . Recurrent falls 03/21/2017  . Weakness 03/21/2017  . Venous insufficiency 03/21/2017  . Excessive daytime sleepiness 03/05/2016  . 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 Cough variant asthma vs UACS/ vcd  03/15/2015  . Autism spectrum disorder 02/14/2015  . Nausea with vomiting 02/14/2015  . Obesity, morbid (HCC)  12/13/2014  . 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 Acanthosis nigricans 11/14/2014  . Toeing-in 08/29/2014  . 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 Recurrent urinary tract infection 08/01/2011  . Asthma 08/01/2011  . Chronic constipation 08/01/2011  . Contraception 08/01/2011    Past Surgical History:  Procedure Laterality Date  . CYSTOSCOPY N/A 06/03/2017   Procedure: CYSTOSCOPY WITH EXAM UNDER ANESTHESIA;  Surgeon: Jerilee Field, MD;  Location: [facility];  Service: Urology;  Laterality: N/A;  . CYSTOSCOPY WITH HYDRODISTENSION AND BIOPSY  04/14/2012   Procedure: CYSTOSCOPY/BIOPSY/HYDRODISTENSION;  Surgeon: Lindaann Slough, MD;  Location: [facility];  Service: Urology;;  instillation of marcaine and pyridium  . EUA/  PAP SMEAR/  BREAST EXAM  03-12-2016   dr Erin Fulling [facility]  . EUA/ VAGINOSCOPY/ COLPOSCOPY FO THE HYMENAL RING  10-04-2009    dr Tamela Oddi  [facility]  . TONSILLECTOMY       OB History   No obstetric history on file.      Home Medications    Prior to Admission medications   Medication Sig Start Date End Date Taking? Authorizing Provider  acetaminophen (TYLENOL) 325 MG tablet Take 2 tablets (650 mg total) by mouth every 6 (six) hours as needed for mild pain or moderate pain. 02/27/17   Everlene Farrier, PA-C  albuterol (PROVENTIL) (2.5 MG/3ML) 0.083% nebulizer solution INHALE CONTENTS OF 1 VIAL IN NEBULIZER EVERY 4 TO 6 HOURS IF NEEDED FOR COUGH OR WHEEZING 03/17/18   Kozlow, Alvira Philips, MD  benzonatate (TESSALON) 100 MG capsule Take 2 capsules (200 mg total) by mouth 2 (two) times daily as needed for cough. 12/18/17   Bast, Traci A, NP  budesonide (PULMICORT) 1 MG/2ML nebulizer solution INHALE 2 ML VIA NEBULIZER  TWICE DAILY Patient taking differently: Take 1 mg by nebulization 2 (two) times daily.  04/20/18   Kozlow, Alvira Philips, MD  cetirizine (ZYRTEC) 10 MG tablet Take 1 tablet (10 mg total) by mouth daily. 03/16/18   Kozlow, Alvira Philips, MD  cloNIDine (CATAPRES) 0.1 MG tablet TAKE 1 AND 1/2 TABLET BY MOUTH AT BEDTIME Patient taking differently: Take 0.15 mg by mouth at bedtime.  02/20/18   Elveria Rising, NP  DEPAKOTE 500 MG DR tablet TAKE 1 TABLET BY MOUTH IN THE MORNING AND 2 AT NIGHT Patient taking differently: Take 500-1,000 mg by mouth as directed. TAKE 1 (500 MG) TABLET BY MOUTH IN THE MORNING AND 2 (1000 MG) tablet AT NIGHT 03/16/18   Elveria Rising, NP  famotidine (PEPCID) 20 MG tablet Take 1 tablet (20 mg total) by mouth every 12 (twelve) hours. Pharmacy-please d/c rx for ranitidine 03/19/18   Pyrtle, Carie Caddy, MD  formoterol (PERFOROMIST) 20 MCG/2ML nebulizer solution Take 2 mLs (20 mcg total) by nebulization 2 (two) times daily. 03/16/18  Kozlow, Alvira PhilipsEric J, MD  levofloxacin (LEVAQUIN) 750 MG tablet Take 1 tablet (750 mg total) by mouth daily. X 7 days 04/24/18   Elpidio AnisUpstill, Shari, PA-C  Levonorgestrel-Ethinyl Estradiol (SEASONIQUE) 0.15-0.03 &0.01 MG tablet Take 1 tablet by mouth daily. 01/29/18   Sharyon Cableogers, Veronica C, CNM  Melatonin 3 MG TABS Take 3 mg by mouth at bedtime as needed (for sleep).     [provider]  mometasone (NASONEX) 50 MCG/ACT nasal spray Place 2 sprays into the nose daily. Patient not taking: Reported on 04/28/2018 03/17/18   Jessica PriestKozlow, Eric J, MD  montelukast (SINGULAIR) 10 MG tablet Take 1 tablet (10 mg total) by mouth at bedtime. 03/16/18   Kozlow, Alvira PhilipsEric J, MD  ondansetron (ZOFRAN ODT) 4 MG disintegrating tablet Take 1 tablet (4 mg total) by mouth every 8 (eight) hours as needed for nausea or vomiting. 04/06/18   Melene PlanFloyd, Dan, DO  polyethylene glycol powder (GLYCOLAX/MIRALAX) powder Dissolve 17 grasm in at least 8 ounces water/juice and drink by mouth twice daily Patient taking  differently: Take 17 g by mouth daily as needed for mild constipation.  02/18/18   Pyrtle, Carie CaddyJay M, MD  predniSONE (DELTASONE) 20 MG tablet Take 2 tablets (40 mg total) by mouth daily. 04/29/18   Antony MaduraHumes, Hubert Derstine, PA-C  Revefenacin (YUPELRI) 175 MCG/3ML SOLN Inhale 1 vial into the lungs daily. 03/19/18   Kozlow, Alvira PhilipsEric J, MD  senna (SENOKOT) 8.6 MG tablet Take 1 tablet by mouth at bedtime.     [provider]  traZODone (DESYREL) 50 MG tablet Take 2 tablets (100 mg total) by mouth at bedtime. 02/20/18   Elveria RisingGoodpasture, Tina, NP    Family History Family History  Problem Relation Age of Onset  . Seizures Mother   . Seizures Sister        1 sister has  . Pancreatic cancer Sister   . Colon cancer Neg Hx   . Colon polyps Neg Hx   . Esophageal cancer Neg Hx   . Rectal cancer Neg Hx   . Stomach cancer Neg Hx     Social History Social History   Tobacco Use  . Smoking status: Never Smoker  . Smokeless tobacco: Never Used  Substance Use Topics  . Alcohol use: No    Alcohol/week: 0.0 standard drinks  . Drug use: No     Allergies   Abilify [aripiprazole]; Seroquel [quetiapine fumarate]; Bactrim [sulfamethoxazole-trimethoprim]; Doxycycline; Amoxicillin-pot clavulanate; and Keppra [levetiracetam]   Review of Systems Review of Systems  Unable to perform ROS: Patient nonverbal  Gastrointestinal: Positive for vomiting.     Physical Exam Updated Vital Signs BP 104/77 (BP Location: Right Arm)   Pulse 73   Temp 98.2 F (36.8 C) (Oral)   Resp (!) 21   SpO2 99%   Physical Exam Vitals signs and nursing note reviewed.  Constitutional:      General: She is not in acute distress.    Appearance: She is well-developed. She is not diaphoretic.     Comments: Alert, nontoxic.  HENT:     Head: Normocephalic and atraumatic.     Right Ear: External ear normal.     Left Ear: External ear normal.     Mouth/Throat:     Mouth: Mucous membranes are moist.     Comments: Clear posterior oropharynx.   Patient tolerating secretions without difficulty. Eyes:     General: No scleral icterus.    Conjunctiva/sclera: Conjunctivae normal.  Neck:     Musculoskeletal: Normal range of motion.  Pulmonary:     Effort: Pulmonary effort is normal. No respiratory distress.  Abdominal:     Palpations: Abdomen is soft.     Tenderness: There is no guarding.     Comments: Soft, obese abdomen.  No palpable masses or peritoneal signs.  Musculoskeletal: Normal range of motion.  Skin:    General: Skin is warm and dry.     Coloration: Skin is not pale.     Findings: No erythema or rash.  Neurological:     Mental Status: She is alert and oriented to person, place, and time.  Psychiatric:        Speech: Speech is delayed.      ED Treatments / Results  Labs (all labs ordered are listed, but only abnormal results are displayed) Labs Reviewed  COMPREHENSIVE METABOLIC PANEL - Abnormal; Notable for the following components:      Result Value   CO2 21 (*)    Glucose, Bld 186 (*)    Calcium 8.5 (*)    Total Protein 5.5 (*)    Albumin 2.7 (*)    Alkaline Phosphatase 37 (*)    All other components within normal limits  CBC - Abnormal; Notable for the following components:   WBC 12.3 (*)    RBC 3.82 (*)    Hemoglobin 11.3 (*)    HCT 35.9 (*)    All other components within normal limits  URINALYSIS, ROUTINE W REFLEX MICROSCOPIC - Abnormal; Notable for the following components:   Specific Gravity, Urine 1.031 (*)    Ketones, ur 5 (*)    Leukocytes,Ua TRACE (*)    Bacteria, UA RARE (*)    All other components within normal limits  LIPASE, BLOOD  I-STAT BETA HCG BLOOD, ED ([facility], [facility], [facility] ONLY)    EKG None  Radiology Dg Chest 2 View  Result Date: 04/28/2018 CLINICAL DATA:  Initial evaluation for acute fever. EXAM: CHEST - 2 VIEW COMPARISON:  Prior radiograph from 03/14/2018 FINDINGS: Cardiac and mediastinal silhouettes within normal limits. Lungs normally inflated. No consolidative airspace  disease to suggest pneumonia. No edema or effusion. No pneumothorax. No acute osseous finding. IMPRESSION: No active cardiopulmonary disease. Electronically Signed   By: Rise Mu M.D.   On: 04/28/2018 22:00    Procedures Procedures (including critical care time)  Medications Ordered in ED Medications  sodium chloride flush (NS) 0.9 % injection 3 mL (has no administration in time range)  predniSONE (DELTASONE) tablet 60 mg (60 mg Oral Given 04/29/18 0339)  ondansetron (ZOFRAN-ODT) disintegrating tablet 4 mg (4 mg Oral Given 04/29/18 0339)     Initial Impression / Assessment and Plan / ED Course  I have reviewed the triage vital signs and the nursing notes.  Pertinent labs & imaging results that were available during my care of the patient were reviewed by me and considered in my medical decision making (see chart for details).     23 year old female with history of cognitive delay as well as seizure disorder (followed by pediatric neurology) presents to the emergency department for evaluation.  Mother with a multitude of complaints.  History is very tangential, though route of visit seems to be request for prednisone for her daughter.  Mother believes this would aid in management of acute sinusitis.  The patient was evaluated 5 days ago for sinus congestion; placed on Levaquin.  Mother also reports frequent emesis today, unrelieved by home Zofran.  On my assessment, patient is calm, nontoxic appearing.  She is in no  visible or audible discomfort.  With respect to her history of sinusitis, the patient has no visible nasal drainage.  No significant congestion.  Mother continues to advocate for use of prednisone due to subjective complaints of facial swelling and her daughter.  I do not appreciate this to be the case.  I discussed with mother that I did not specifically feel that steroids were indicated for continued treatment.  Mother became actively frustrated, began raising her voice,  stating that her daughter needed prednisone to help with her breathing.  I tried to reassure the mother that her child's lungs were clear to auscultation.  Her chest x-ray today does not show any evidence of pneumonia or airway narrowing.  She has had no hypoxia while in the ED today.  Despite ongoing conversations with the mother about appropriate management of acute sinusitis, she continues to demand this medication be prescribed for her daughter.  I have discussed the risks of long-term steroid use to which mother reports that she has not required this medication and a few months.  Mother's conversation then pivots to her vomiting prior to arrival and potential seizure activity.  She has not witnessed to experience any seizure activity while in the waiting room for over 6 hours.  She has no clinical signs of dehydration and no evidence of active emesis.  Now tolerating oral fluids in the ED without difficulty.  Laboratory evaluation is largely consistent with prior evaluations.  No significant electrolyte derangements.  Unfortunately, I fear this patient will continue to be difficult to treat in the future.  More due to mother impeding appropriate care (I.e. trying to dictate course of treatment, multiple ED visits with care by different providers).  The patient is, sadly, unable to advocate for herself due to cognitive delay.  I do not feel that a 5-day course of prednisone is highly detrimental to the patient currently; however can cause issues if recurrently used.  I repeatedly discussed with mother increased risk of pathologic fracture with steroid use.  She wishes the patient be prescribed this medication anyway.  It was prescribed at 40 mg daily x5 days to appease mother today.  I have continued to encourage follow up with her outpatient providers.  No indication for continued emergent work up at this time.  Patient discharged in stable condition.   Final Clinical Impressions(s) / ED Diagnoses    Final diagnoses:  Non-intractable vomiting, presence of nausea not specified, unspecified vomiting type    ED Discharge Orders         Ordered    predniSONE (DELTASONE) 20 MG tablet  Daily     04/29/18 0431           Antony Madura, PA-C 04/29/18 0730    Zadie Rhine, MD 04/30/18 [phone]

## 2018-04-29 NOTE — Progress Notes (Signed)
Follow Up Note  RE: Lori Miles MRN: [id number] DOB: 07-17-1995 Date of Office Visit: 04/29/2018  Referring provider: Myrlene Broker, * Primary care provider: Myrlene Broker, MD  Chief Complaint: Asthma and Hospitalization Follow-up  History of Present Illness: I had the pleasure of seeing Lori Miles for a follow up visit at the [facility] on 04/29/2018. She is a 23 y.o. female, who is being followed for asthma, allergic rhinitis, GERD and recurrent infections. Today she is here for new complaint of not feeling well and asthma issues. She is accompanied today by her mother who provided/contributed to the history. Her previous allergy office visit was on 03/17/2018 with Dr. Lucie Leather.   Patient went to the ER on 04/24/2018 due to change in baseline behavior with increased sleeping. She was placed on Levaquin for maxillary sinus infection which did not help.   On 04/28/2018 she went to the ER for vomiting and coughing. Mother is very concerned about this going down to her lungs and becoming pneumonia. She also mentions that her face is puffy and is wheezing. Patient felt warm the last few days but mother did not take her temperature.  Initially mother said that only using budesonide 1mg  nebulizer BID but then corrected herself later during the visit that they increased to 4 times a day as instructed to do so during URIs. Still taking perforomist nebulizer BID and yupelri QHS with no benefit. Using albuterol nebulizer twice a day with minimal benefit.   She is only using nasonex as needed. Still taking Singulair and zyrtec 10mg  daily, Pepcid 20mg  BID.  Last month she had a flare with pneumonia. Patient is not up to date with flu vaccine.    Patient was never on injections for asthma.   She also has sleep apnea but not able to get the machine yet.   Assessment and Plan: Lori Miles is a 23 y.o. female with: Not well controlled moderate persistent  asthma Not well controlled asthma. Symptoms flaring with coughing and vomiting the last week. Mother believes she has an URI and noted some fevers but did not measure.   On physical exam lungs were clear. CXR from 04/28/18 was normal. . Start prednisone as 40mg  daily as prescribed for the next 4 days. . Increase budesonide nebulizer to 4 times a day for the next 1-2 weeks. . Pretreat with albuterol nebulizer for the next 3-5 days. Doreatha Martin Levaquin . Daily controller medication(s): budesonide 1mg  nebulizer twice a day. o Perforomist nebulizer twice a day o Yupelri nebulizer once a day . Prior to physical activity: May use albuterol rescue inhaler 2 puffs 5 to 15 minutes prior to strenuous physical activities. Marland Kitchen Rescue medications: May use albuterol rescue inhaler 2 puffs or nebulizer every 4 to 6 hours as needed for shortness of breath, chest tightness, coughing, and wheezing. Monitor frequency of use.  . During upper respiratory infections: Start budesonide 1mg  four times a day for 1-2 weeks.   Continue montelukast 10mg  daily.  Continue cetirizine 10mg  daily.  Will check alpha-1 antitrypsin level and rule out CF at next visit.  Chronic rhinitis Past history - 2018 Immunocal was negative with IgE of 7. Interim history -not using nasal sprays daily.  Continue nasonex 1 spray daily.  Gastroesophageal reflux disease  Continue Pepcid 20mg  BID.  Recurrent infections Past history - normal IgG values and robust response to pneumovax.  Interim history - still having frequent URIs.   Keep track of infections. If  persistent, recheck immunoglobulin levels and s pneumo titers.   Recommend annual flu vaccine. Did not receive it this flu season.  Return in about 4 weeks (around 05/27/2018).  Meds ordered this encounter  Medications  . budesonide (PULMICORT) 1 MG/2ML nebulizer solution    Sig: Take 2 mLs (1 mg total) by nebulization 4 (four) times daily. During upper respiratory infections  and asthma flares for 1-2 weeks.    Dispense:  120 mL    Refill:  2   Diagnostics: None.  Medication List:  Current Outpatient Medications  Medication Sig Dispense Refill  . acetaminophen (TYLENOL) 325 MG tablet Take 2 tablets (650 mg total) by mouth every 6 (six) hours as needed for mild pain or moderate pain. 60 tablet 0  . albuterol (PROVENTIL) (2.5 MG/3ML) 0.083% nebulizer solution INHALE CONTENTS OF 1 VIAL IN NEBULIZER EVERY 4 TO 6 HOURS IF NEEDED FOR COUGH OR WHEEZING 150 mL 5  . benzonatate (TESSALON) 100 MG capsule Take 2 capsules (200 mg total) by mouth 2 (two) times daily as needed for cough. 20 capsule 0  . budesonide (PULMICORT) 1 MG/2ML nebulizer solution Take 2 mLs (1 mg total) by nebulization 4 (four) times daily. During upper respiratory infections and asthma flares for 1-2 weeks. 120 mL 2  . cetirizine (ZYRTEC) 10 MG tablet Take 1 tablet (10 mg total) by mouth daily. 30 tablet 3  . cloNIDine (CATAPRES) 0.1 MG tablet TAKE 1 AND 1/2 TABLET BY MOUTH AT BEDTIME (Patient taking differently: Take 0.15 mg by mouth at bedtime. ) 45 tablet 5  . DEPAKOTE 500 MG DR tablet TAKE 1 TABLET BY MOUTH IN THE MORNING AND 2 AT NIGHT (Patient taking differently: Take 500-1,000 mg by mouth as directed. TAKE 1 (500 MG) TABLET BY MOUTH IN THE MORNING AND 2 (1000 MG) tablet AT NIGHT) 90 tablet 5  . famotidine (PEPCID) 20 MG tablet Take 1 tablet (20 mg total) by mouth every 12 (twelve) hours. Pharmacy-please d/c rx for ranitidine 60 tablet 3  . formoterol (PERFOROMIST) 20 MCG/2ML nebulizer solution Take 2 mLs (20 mcg total) by nebulization 2 (two) times daily. 120 mL 2  . levofloxacin (LEVAQUIN) 750 MG tablet Take 1 tablet (750 mg total) by mouth daily. X 7 days 7 tablet 0  . Levonorgestrel-Ethinyl Estradiol (SEASONIQUE) 0.15-0.03 &0.01 MG tablet Take 1 tablet by mouth daily. 3 Package 3  . Melatonin 3 MG TABS Take 3 mg by mouth at bedtime as needed (for sleep).     . mometasone (NASONEX) 50 MCG/ACT  nasal spray Place 2 sprays into the nose daily. 17 g 5  . montelukast (SINGULAIR) 10 MG tablet Take 1 tablet (10 mg total) by mouth at bedtime. 30 tablet 3  . ondansetron (ZOFRAN ODT) 4 MG disintegrating tablet Take 1 tablet (4 mg total) by mouth every 8 (eight) hours as needed for nausea or vomiting. 20 tablet 0  . polyethylene glycol powder (GLYCOLAX/MIRALAX) powder Dissolve 17 grasm in at least 8 ounces water/juice and drink by mouth twice daily (Patient taking differently: Take 17 g by mouth daily as needed for mild constipation. ) 1020 g 3  . predniSONE (DELTASONE) 20 MG tablet Take 2 tablets (40 mg total) by mouth daily. 8 tablet 0  . Revefenacin (YUPELRI) 175 MCG/3ML SOLN Inhale 1 vial into the lungs daily. 270 mL 2  . senna (SENOKOT) 8.6 MG tablet Take 1 tablet by mouth at bedtime.     Marland Kitchen. terconazole (TERAZOL 3) 0.8 % vaginal cream USE TOPICALLY  IF NEEDED FOR YEAST    . traZODone (DESYREL) 50 MG tablet Take 2 tablets (100 mg total) by mouth at bedtime. 62 tablet 5   No current facility-administered medications for this visit.    Allergies: Allergies  Allergen Reactions  . Abilify [Aripiprazole] Swelling and Palpitations  . Seroquel [Quetiapine Fumarate] Palpitations  . Bactrim [Sulfamethoxazole-Trimethoprim] Other (See Comments)    Abdominal pain  . Doxycycline Other (See Comments)    Stomach cramps  . Amoxicillin-Pot Clavulanate Hives    Has patient had a PCN reaction causing immediate rash, facial/tongue/throat swelling, SOB or lightheadedness with hypotension: No Has patient had a PCN reaction causing severe rash involving mucus membranes or skin necrosis: No Has patient had a PCN reaction that required hospitalization: No Has patient had a PCN reaction occurring within the last 10 years: No If all of the above answers are "NO", then may proceed with Cephalosporin use.   Marland Kitchen Keppra [Levetiracetam] Other (See Comments)    Aggressive behavior    I reviewed her past medical  history, social history, family history, and environmental history and no significant changes have been reported from previous visit on 03/17/2018.  Review of Systems  Constitutional: Negative for appetite change, chills, fever and unexpected weight change.  HENT: Negative for congestion and rhinorrhea.   Eyes: Negative for itching.  Respiratory: Positive for cough. Negative for chest tightness, shortness of breath and wheezing.   Gastrointestinal: Positive for vomiting. Negative for abdominal pain.  Skin: Negative for rash.  Neurological: Negative for headaches.   Objective: BP 102/65   Pulse (!) 120   Temp 98.5 F (36.9 C) (Oral)   Resp 16   Ht 4' 9.5" (1.461 m)   Wt 185 lb (83.9 kg)   SpO2 91%   BMI 39.34 kg/m  Body mass index is 39.34 kg/m. Physical Exam  Constitutional: She is oriented to person, place, and time. She appears well-nourished.  HENT:  Head: Normocephalic and atraumatic.  Right Ear: External ear normal.  Left Ear: External ear normal.  Nose: Nose normal.  Mouth/Throat: Oropharynx is clear and moist.  Eyes: Conjunctivae and EOM are normal.  Neck: Neck supple.  Cardiovascular: Normal rate, regular rhythm and normal heart sounds. Exam reveals no gallop and no friction rub.  No murmur heard. Pulmonary/Chest: Effort normal and breath sounds normal. She has no wheezes. She has no rales.  Lymphadenopathy:    She has no cervical adenopathy.  Neurological: She is alert and oriented to person, place, and time.  Skin: Skin is warm. No rash noted.  Psychiatric: She has a normal mood and affect. Her behavior is normal.  Nursing note and vitals reviewed.  Previous notes and tests were reviewed. The plan was reviewed with the patient/family, and all questions/concerned were addressed.  It was my pleasure to see Kelanie today and participate in her care. Please feel free to contact me with any questions or concerns.  Sincerely,  Wyline Mood, DO Allergy &  Immunology  [facility] office: [phone] [facility] office: [phone]

## 2018-04-30 ENCOUNTER — Other Ambulatory Visit: Payer: Self-pay | Admitting: Internal Medicine

## 2018-04-30 ENCOUNTER — Encounter (INDEPENDENT_AMBULATORY_CARE_PROVIDER_SITE_OTHER): Payer: Self-pay | Admitting: Family

## 2018-04-30 NOTE — Telephone Encounter (Signed)
Tried to call patient to advise dr crawfords instructions, her mailbox is full---will keep trying later

## 2018-04-30 NOTE — Telephone Encounter (Signed)
Spoke with pt's mother to make aware of VS' recs.  Offered an appt with both VS or a sooner appt with a NP.  [name] states that "pt was just seen last month, she doesn't need an appt".  I advised that pt was last seen in 12/2017, and explained that if there is concern about pt not getting enough oxygen that we would need to test her levels in clinic.  [name] then stated that her daughter could not come in for an appt, and that she would like to schedule an appt so just she could speak to VS.  I advised that we could not schedule an appt in this way, and that Eldine would have to be present at the OV as she is the patient.  The pt then said that she refused to speak to me any further, and wanted to speak to someone else, then hung up the phone.    VS please advise- I'm not sure how else to proceed.  Thanks.

## 2018-04-30 NOTE — Telephone Encounter (Signed)
Patient mother called states that she would like to come in and see Dr. Craige CottaSood or Babette Relicammy Parrett without the patient because she states the patient is falling more frequently and she is unable to carry her and that she prefers not to discuss things in front of the patient - She can be reached at [phone]

## 2018-04-30 NOTE — Telephone Encounter (Signed)
She needs to be schedule for ROV with me or NP to discuss in more detail.

## 2018-04-30 NOTE — Telephone Encounter (Signed)
Patient advised to ask to be transferred to [facility] office, can talk with tamara, if she can't get appt for her daughter----try not to go to ED if possible---also , mother is requesting refill on zofran, states last rx was done by ED doctor, but dr crawford usually does this for her--routing to dr crawford, please advise on rx request, I will call patient's mother back if needed, thanks

## 2018-04-30 NOTE — Telephone Encounter (Signed)
When she calls in and her daughter is sick she needs to see anyone who is available as we all share our records and this is more appropriate than going to the ER for these problems. We have many doctors in our practice for this reason so we can make sure to see our patients when they are sick.

## 2018-05-04 NOTE — Telephone Encounter (Signed)
Message previously routed to Dr Craige Cotta.

## 2018-05-05 ENCOUNTER — Ambulatory Visit: Payer: Medicare Other | Admitting: Physical Therapy

## 2018-05-05 ENCOUNTER — Other Ambulatory Visit: Payer: Self-pay | Admitting: *Deleted

## 2018-05-05 ENCOUNTER — Telehealth: Payer: Self-pay | Admitting: Allergy and Immunology

## 2018-05-05 DIAGNOSIS — R2681 Unsteadiness on feet: Secondary | ICD-10-CM | POA: Diagnosis not present

## 2018-05-05 DIAGNOSIS — R2689 Other abnormalities of gait and mobility: Secondary | ICD-10-CM | POA: Diagnosis not present

## 2018-05-05 DIAGNOSIS — M6281 Muscle weakness (generalized): Secondary | ICD-10-CM
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
## 2018-05-05 NOTE — Telephone Encounter (Signed)
Please advise 

## 2018-05-05 NOTE — Telephone Encounter (Signed)
Pt mom called and said that she was still not well and is bleeding out of nose and side of her face is swolling. Needs prednisone and another round of Levaquin. [facility] [address] [phone].

## 2018-05-06 ENCOUNTER — Telehealth: Payer: Self-pay

## 2018-05-06 NOTE — Telephone Encounter (Signed)
Copied from CRM [id number]. Topic: General - Other >> May 06, 2018 10:14 AM Tamela Oddi wrote: Reason for CRM: Hasini's mother called to speak with Dr. Frutoso Chase nurse, Luanna Cole.  She stated that she does not want to speak with anyone but [place].  CB# [phone]

## 2018-05-06 NOTE — Telephone Encounter (Signed)
Please inform mom that Selah's medical condition is too complex for telephone care and she needs to be seen.

## 2018-05-06 NOTE — Therapy (Signed)
[facility] [address] [place], [place], [address] Phone: [phone]   Fax:  [phone]  Physical Therapy Treatment  Patient Details  Name: Lori Miles MRN: [id number] Date of Birth: 01-09-1996 Referring Provider (PT): Elveria Rising, NP   Encounter Date: 05/05/2018  PT End of Session - 05/06/18 1447    Visit Number  3    Number of Visits  5    Date for PT Re-Evaluation  05/22/18    Authorization Type  UHC Medicare    Authorization Time Period  04-14-18 - 07-11-18    PT Start Time  0801    PT Stop Time  0849    PT Time Calculation (min)  48 min    Equipment Utilized During Treatment  Gait belt       Past Medical History:  Diagnosis Date  . Asthma   . Autism    mom states no actual dx, but admits she has some symptoms  . Bacteriuria   . Chronic constipation   . Cognitive developmental delay   . Disruptive behavior disorder   . Family history of adverse reaction to anesthesia    mother had itching after anesthesia one time  . Frequency-urgency syndrome   . Gait disorder    ORGANIC PER NERUOLGIST NOTE (DR HICKLING)  . 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 GERD (gastroesophageal reflux disease)   . Gout   . H/O sinus tachycardia   . History of acute respiratory failure 02/14/2015   SECONDARY TO CAP  . History of recurrent UTIs   . Interstitial cystitis   . Moderate intellectual disability   . OSA (obstructive sleep apnea)    no machine yet   . Partial epilepsy with impairment of consciousness (HCC)    FOLLOWED BY DR HICKLING  . Pneumonia 2018 last time  . PONV (postoperative nausea and vomiting)   . Seizure (HCC)    most recent sz " at the end of summer"-last sz years ago per mother  . Urinary incontinence     Past Surgical History:  Procedure Laterality Date  . CYSTOSCOPY N/A 06/03/2017   Procedure: CYSTOSCOPY WITH EXAM UNDER ANESTHESIA;  Surgeon: Jerilee Field, MD;  Location: [facility];  Service: Urology;  Laterality: N/A;  . CYSTOSCOPY WITH HYDRODISTENSION AND BIOPSY  04/14/2012   Procedure: CYSTOSCOPY/BIOPSY/HYDRODISTENSION;  Surgeon: Lindaann Slough, MD;  Location: [facility];  Service: Urology;;  instillation of marcaine and pyridium  . EUA/  PAP SMEAR/  BREAST EXAM  03-12-2016   dr Erin Fulling [facility]  . EUA/ VAGINOSCOPY/ COLPOSCOPY FO THE HYMENAL RING  10-04-2009    dr Tamela Oddi  [facility]  . TONSILLECTOMY      There were no vitals filed for this visit.  Subjective Assessment - 05/06/18 1434    Subjective  Mother states that they took pt back to ED last week after PT due to some difficulty with breathing, although mother had given pt breathing treatment prior to PT; states pt was seen at Dr. Darl Householder office on Tuesday last week; mother continues to report that pt has episodes of legs trembling and dropping things out of her hands    Patient is accompained by:  Family member    Pertinent History  Cognitive developmentally delayed:  recurrent falls:  weakness:  tremor, unspecified:  generalized convulsive epilepsy;  autism spectrum disorder    Patient Stated Goals  improve walking and try to stop the tremors  Currently in Pain?  No/denies                       [facility] Adult PT Treatment/Exercise - 05/06/18 0001      Ambulation/Gait   Ambulation/Gait  Yes    Ambulation/Gait Assistance  4: Min guard    Ambulation Distance (Feet)  230 Feet   without device with CGA   Assistive device  None    Gait Pattern  Step-through pattern   LLE internally rotates with foot flat contact at stance   Ambulation Surface  Level;Indoor    Stairs  Yes    Stairs Assistance  5: Supervision    Stair Management Technique  Two rails;Forwards;Step to pattern    Number of Stairs  4    Height of Stairs  6    Gait Comments  Trialed standard RW for approx. 100' with assistance guiding walker; then trialed  rollatorfor 115' with CGA; pt had mild episode of legs trembling with use of standard RW but did not require sitting down; pt able to stand approx. 5-10 secs and then complete lap of ambulation      Exercises   Exercises  Knee/Hip      Knee/Hip Exercises: Aerobic   Recumbent Bike  SciFit level 2.0 x 5"       Knee/Hip Exercises: Standing   Other Standing Knee Exercises  Pt performed stepping over and back of  black balance beam 5 reps each leg with min hand held assist; pt internally rotated LLE when lifting leg over beam to floor - unable to maintain LLE in neutral position       Knee/Hip Exercises: Seated   Long Arc Quad  AROM;Both;1 set;10 reps      Attempted stepping over and back of orange hurdle but pt unable to keep balance (hand held assist was given) and pt Appeared anxious and fearful of falling with this activity so black balance beam was used             PT Long Term Goals - 04/15/18 2002      PT LONG TERM GOAL #1   Title  Pt. will amb. 500' on flat, even surface without LOB.      Time  4    Period  Weeks    Status  New    Target Date  05/22/18      PT LONG TERM GOAL #2   Title  Pt will perform at least 10" on recumbent bike nonstop for improved endurance/activity tolerance.    Time  4    Period  Weeks    Status  New    Target Date  05/22/18      PT LONG TERM GOAL #3   Title  Mother will verbalize understanding of HEP to assist with LE strengthening.  (11-20-14)    Time  4    Period  Weeks    Status  New    Target Date  05/22/18      PT LONG TERM GOAL #4   Title  Pt. will perform 10" nonstop on bike to demo improved endurance/activity tolerance.  (11-20-14)    Time  4    Status  New    Target Date  05/22/18            Plan - 05/06/18 1448    Clinical Impression Statement  Pt did well with gait training with pt amb. 230' at beginning of session with no device; standard  RW was trialed and pt had 1 occurrence of mild tremors in legs with standing  rest break taken prior to continuing with ambulation around track. Pt's gait not signficanlty improved with use of RW or with use of rollator - pt needs cues and assistance for maneuvering RW in crowded environment - pt is easily distracted and does not consistently focus on task at hand.                                                                                Rehab Potential  Good    PT Frequency  1x / week    PT Duration  4 weeks    PT Treatment/Interventions  ADLs/Self Care Home Management;DME Instruction;Gait training;Stair training;Orthotic Fit/Training;Patient/family education;Neuromuscular re-education;Balance training;Therapeutic exercise;Therapeutic activities    PT Next Visit Plan  HEP for LE strengthening    Consulted and Agree with Plan of Care  Patient       Patient will benefit from skilled therapeutic intervention in order to improve the following deficits and impairments:  Abnormal gait, Decreased balance, Decreased strength, Decreased activity tolerance, Decreased endurance, Decreased safety awareness, Pain, Decreased coordination  Visit Diagnosis: Other abnormalities of gait and mobility  Unsteadiness on feet  Muscle weakness (generalized)     Problem List Patient Active Problem List   Diagnosis Date Noted  . Not well controlled moderate persistent asthma 04/29/2018  . Gastroesophageal reflux disease 04/29/2018  . Recurrent infections 04/29/2018  . Dysuria 02/20/2018  . Chronic rhinitis 04/03/2017  . Recurrent falls 03/21/2017  . Weakness 03/21/2017  . Venous insufficiency 03/21/2017  . Excessive daytime sleepiness 03/05/2016  . 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 Cough variant asthma vs UACS/ vcd  03/15/2015  . Autism spectrum disorder 02/14/2015  . Nausea with vomiting 02/14/2015  . Obesity, morbid (HCC) 12/13/2014  . 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 Sleep arousal disorder  11/14/2014  . Acanthosis nigricans 11/14/2014  . Toeing-in 08/29/2014  . 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 Recurrent urinary tract infection 08/01/2011  . Asthma 08/01/2011  . Chronic constipation 08/01/2011  . Contraception 08/01/2011    Kary Kosilday, Arliss Hepburn Suzanne, PT 05/06/2018, 3:07 PM   [facility] [address] [place], [place], [address] Phone: [phone]   Fax:  [phone]  Name: Lori Miles MRN: [id number] Date of Birth: 16-Feb-1996

## 2018-05-06 NOTE — Telephone Encounter (Signed)
She needs to be seen before doing any additional antibiotics or prednisone.

## 2018-05-06 NOTE — Telephone Encounter (Signed)
Dr Lucie Leather please advise mother does not have transportation and mother wants you to address this issue.

## 2018-05-06 NOTE — Telephone Encounter (Signed)
Spoke to mother states she cannot bring Lori Miles to be seen states she does not have transportation and all she wants is antibiotics and prednisone dose to be higher. Mother states she is going to wait until it out she does not have gas money to be bringing her back to see [name] since she is the same she was last week. Mother states she will call back

## 2018-05-06 NOTE — Telephone Encounter (Signed)
Patient's mother was calling to talk about the fact that when she calls in she is always told that there is no availability with any provider to make an appointment. Patient's mother was also saying that she had talked to Delaney Meigs and was told that we can try and squeeze patient in so she does not have to keep going to the ED. States that when she tells the call center that they tell her that it cannot be done and will not transfer her to the ladies upfront to talk to or that a message wont be sent back to [name] to ask. Patient was asking for some sort of note to be put in the chart so when she calls she can talk to the girls upfront or that the [facility] will ask [name] if we are willing to fit her in.

## 2018-05-07 ENCOUNTER — Other Ambulatory Visit: Payer: Self-pay

## 2018-05-07 NOTE — Telephone Encounter (Signed)
In theory they should be able to do this for anyone.

## 2018-05-07 NOTE — Patient Outreach (Signed)
[facility]) Care Management  05/07/2018  Airis Avina Chaires 26-Jun-1995 [phone]   Initial outreach to patient's mother/caregiver regarding social work referral for transportation resources to appointment in [place].  Per Ms. Monsanto, she asked that referral not be submitted to social work.  She said that she plans to continue trying to connect with patient's [employer] and Care Coordinator.  BSW encouraged her to reach out to their supervisors if she continues to have difficulty connecting with them. BSW is closing case.  Malachy Chamber, BSW Social Worker [phone]

## 2018-05-07 NOTE — Telephone Encounter (Signed)
Noted  

## 2018-05-08 DIAGNOSIS — J449 Chronic obstructive pulmonary disease, unspecified: Secondary | ICD-10-CM | POA: Diagnosis not present

## 2018-05-11 NOTE — Telephone Encounter (Signed)
She can call back to schedule appointment with her daughter if she would like.  Otherwise, she can f/u with her PCP and neurologist to assess cause of her excessive daytime sleepiness.

## 2018-05-12 ENCOUNTER — Ambulatory Visit: Payer: Medicare Other | Admitting: Physical Therapy

## 2018-05-12 DIAGNOSIS — M6281 Muscle weakness (generalized): Secondary | ICD-10-CM

## 2018-05-12 DIAGNOSIS — R2689 Other abnormalities of gait and mobility: Secondary | ICD-10-CM | POA: Diagnosis not present

## 2018-05-12 DIAGNOSIS — R2681 Unsteadiness on feet: Secondary | ICD-10-CM | POA: Diagnosis not present

## 2018-05-12 NOTE — Telephone Encounter (Signed)
Left message for patients mother to call back. ° °

## 2018-05-13 ENCOUNTER — Ambulatory Visit (HOSPITAL_COMMUNITY): Payer: Medicare Other

## 2018-05-13 NOTE — Telephone Encounter (Signed)
LMTCB x2 for pt's mother.  

## 2018-05-13 NOTE — Therapy (Signed)
[facility] [address] [place], [place], [address] Phone: [phone]   Fax:  [phone]  Physical Therapy Treatment  Patient Details  Name: Lori Miles MRN: [id number] Date of Birth: [birth year] Referring Provider (PT): Elveria Rising, NP   Encounter Date: 05/12/2018  PT End of Session - 05/13/18 1656    Visit Number  4    Number of Visits  5    Date for PT Re-Evaluation  05/22/18    Authorization Type  UHC Medicare    Authorization Time Period  04-14-18 - 07-11-18    PT Start Time  0802    PT Stop Time  0848    PT Time Calculation (min)  46 min    Equipment Utilized During Treatment  Gait belt       Past Medical History:  Diagnosis Date  . Asthma   . Autism    mom states no actual dx, but admits she has some symptoms  . Bacteriuria   . Chronic constipation   . Cognitive developmental delay   . Disruptive behavior disorder   . Family history of adverse reaction to anesthesia    mother had itching after anesthesia one time  . Frequency-urgency syndrome   . Gait disorder    ORGANIC PER NERUOLGIST NOTE (DR HICKLING)  . 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 GERD (gastroesophageal reflux disease)   . Gout   . H/O sinus tachycardia   . History of acute respiratory failure 02/14/2015   SECONDARY TO CAP  . History of recurrent UTIs   . Interstitial cystitis   . Moderate intellectual disability   . OSA (obstructive sleep apnea)    no machine yet   . Partial epilepsy with impairment of consciousness (HCC)    FOLLOWED BY DR HICKLING  . Pneumonia 2018 last time  . PONV (postoperative nausea and vomiting)   . Seizure (HCC)    most recent sz " at the end of summer"-last sz years ago per mother  . Urinary incontinence     Past Surgical History:  Procedure Laterality Date  . CYSTOSCOPY N/A 06/03/2017   Procedure: CYSTOSCOPY WITH EXAM UNDER ANESTHESIA;  Surgeon: Jerilee Field, MD;  [facility]  Service: Urology;  Laterality: N/A;  . CYSTOSCOPY WITH HYDRODISTENSION AND BIOPSY  04/14/2012   Procedure: CYSTOSCOPY/BIOPSY/HYDRODISTENSION;  Surgeon: Lindaann Slough, MD;  Location: [facility];  Service: Urology;;  instillation of marcaine and pyridium  . EUA/  PAP SMEAR/  BREAST EXAM  03-12-2016   dr Erin Fulling [facility]  . EUA/ VAGINOSCOPY/ COLPOSCOPY FO THE HYMENAL RING  10-04-2009    dr Tamela Oddi  [facility]  . TONSILLECTOMY      There were no vitals filed for this visit.  Subjective Assessment - 05/13/18 1647    Subjective  Mother states that pt had an episode over the weekend but says it wasn't bad; mother wonders if pt may have a UTI; mother reports that pt has appt for EEG but she is going to have to reschedule it - states she was unaware of this appt until just recently      Patient is accompained by:  Family member    Pertinent History  Cognitive developmentally delayed:  recurrent falls:  weakness:  tremor, unspecified:  generalized convulsive epilepsy;  autism spectrum disorder    Patient Stated Goals  improve walking and try to stop the tremors    Currently in Pain?  No/denies  [facility] Adult PT Treatment/Exercise - 05/13/18 0001      Ambulation/Gait   Ambulation/Gait  Yes    Ambulation/Gait Assistance  4: Min guard   pt prefers HHA   Ambulation Distance (Feet)  200 Feet   2 reps for 400' total distance   Assistive device  1 person hand held assist    Gait Pattern  Step-through pattern   LLE internally rotates with foot flat contact at stance   Ambulation Surface  Level;Indoor    Stairs  Yes    Stairs Assistance  4: Min guard    Stair Management Technique  Two rails;Alternating pattern;Step to pattern   step to pattern with descension   Number of Stairs  4   2 reps = 8 steps total   Height of Stairs  6      Knee/Hip Exercises: Aerobic   Recumbent Bike  SciFit level 2.0  x 5"       Knee/Hip Exercises: Standing   Hip Flexion  Stengthening;Both;1 set;10 reps;Knee bent;Knee straight   2# weight    Hip Abduction  Stengthening;Both;1 set;10 reps;Knee straight   2# weight   Hip Extension  Stengthening;Both;1 set;10 reps;Knee straight   2# weight      TherAct:  Pt performed standing zoom ball activity for LE strengthening and for UE strengthening/functional use  Pt performed standing balance activity - reaching for 5 bean bags on mat on Rt side and for 5 bean bags on Lt side and tossing in basket- CGA for safety   Pt performed stepping over and back of black balance beam with hand held assist - 5 reps each leg for improved SLS            PT Long Term Goals - 04/15/18 2002      PT LONG TERM GOAL #1   Title  Pt. will amb. 500' on flat, even surface without LOB.      Time  4    Period  Weeks    Status  New    Target Date  05/22/18      PT LONG TERM GOAL #2   Title  Pt will perform at least 10" on recumbent bike nonstop for improved endurance/activity tolerance.    Time  4    Period  Weeks    Status  New    Target Date  05/22/18      PT LONG TERM GOAL #3   Title  Mother will verbalize understanding of HEP to assist with LE strengthening.  (11-20-14)    Time  4    Period  Weeks    Status  New    Target Date  05/22/18      PT LONG TERM GOAL #4   Title  Pt. will perform 10" nonstop on bike to demo improved endurance/activity tolerance.  (11-20-14)    Time  4    Status  New    Target Date  05/22/18            Plan - 05/13/18 1658    Clinical Impression Statement  Pt had 2 episodes of mild Rt knee instability during standing balance activities but pt had no LOB with either of these occurrences; pt was performing the balance activities by side of mat table and pt sat down for few seconds after each occurrence; no tremors/trembling/ or shakiness was noted in UE's nor in LE's during today's PT session.  Pt performed more standing activities  today than she has tolerated in  previous PT sessions.     Rehab Potential  Good    PT Frequency  1x / week    PT Duration  4 weeks    PT Treatment/Interventions  ADLs/Self Care Home Management;DME Instruction;Gait training;Stair training;Orthotic Fit/Training;Patient/family education;Neuromuscular re-education;Balance training;Therapeutic exercise;Therapeutic activities    PT Next Visit Plan  HEP for LE strengthening    Consulted and Agree with Plan of Care  Patient       Patient will benefit from skilled therapeutic intervention in order to improve the following deficits and impairments:  Abnormal gait, Decreased balance, Decreased strength, Decreased activity tolerance, Decreased endurance, Decreased safety awareness, Pain, Decreased coordination  Visit Diagnosis: Other abnormalities of gait and mobility  Unsteadiness on feet  Muscle weakness (generalized)     Problem List Patient Active Problem List   Diagnosis Date Noted  . Not well controlled moderate persistent asthma 04/29/2018  . Gastroesophageal reflux disease 04/29/2018  . Recurrent infections 04/29/2018  . Dysuria 02/20/2018  . Chronic rhinitis 04/03/2017  . Recurrent falls 03/21/2017  . Weakness 03/21/2017  . Venous insufficiency 03/21/2017  . Excessive daytime sleepiness 03/05/2016  . 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 Cough variant asthma vs UACS/ vcd  03/15/2015  . Autism spectrum disorder 02/14/2015  . Nausea with vomiting 02/14/2015  . Obesity, morbid (HCC) 12/13/2014  . 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 Acanthosis nigricans 11/14/2014  . Toeing-in 08/29/2014  . 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 Recurrent urinary tract infection 08/01/2011  . Asthma 08/01/2011   . Chronic constipation 08/01/2011  . Contraception 08/01/2011    Kary Kos, PT 05/13/2018, 7:25 PM  [facility] [address] [place], [place], [address] Phone: [phone]   Fax:  [phone]  Name: Lori Miles MRN: [id number] Date of Birth: [birth year]

## 2018-05-14 NOTE — Telephone Encounter (Signed)
Called and spoke with Patient's mother. And she would bring in her daughter for an appointment with Dr. Craige Cotta. Patient's mother said she will call back to make appointment as she doesn't have her schedule with her at this time.

## 2018-05-15 ENCOUNTER — Other Ambulatory Visit: Payer: Self-pay | Admitting: Family

## 2018-05-15 DIAGNOSIS — G40901 Epilepsy, unspecified, not intractable, with status epilepticus: Secondary | ICD-10-CM

## 2018-05-15 NOTE — Telephone Encounter (Signed)
Called and spoke with Patients Mother.  She stated that the Patient was between therapy and has a EEG scheduled next week.  She will call back to schedule OV with Dr Craige Cotta, once she speaks with therapy about upcoming visits.

## 2018-05-18 NOTE — Telephone Encounter (Signed)
Called and spoke with Patient's Mother. Patient's Mother stated that she would call and make OV with Dr Craige Cotta, once she has other OV, and testing scheduled.

## 2018-05-20 ENCOUNTER — Ambulatory Visit: Payer: Medicare Other | Admitting: Family Medicine

## 2018-05-20 ENCOUNTER — Telehealth: Payer: Self-pay | Admitting: Internal Medicine

## 2018-05-20 ENCOUNTER — Ambulatory Visit (HOSPITAL_COMMUNITY): Payer: Medicare Other

## 2018-05-20 ENCOUNTER — Other Ambulatory Visit: Payer: Self-pay

## 2018-05-20 ENCOUNTER — Encounter (HOSPITAL_COMMUNITY): Payer: Self-pay | Admitting: Emergency Medicine

## 2018-05-20 ENCOUNTER — Ambulatory Visit (HOSPITAL_COMMUNITY)
Admission: EM | Admit: 2018-05-20 | Discharge: 2018-05-20 | Disposition: A | Discharge status: Home / Self Care | Payer: Medicare Other | Attending: Internal Medicine | Admitting: Internal Medicine

## 2018-05-20 DIAGNOSIS — Z87440 Personal history of urinary (tract) infections: Secondary | ICD-10-CM | POA: Diagnosis not present

## 2018-05-20 DIAGNOSIS — J0120 Acute ethmoidal sinusitis, unspecified: Secondary | ICD-10-CM

## 2018-05-20 DIAGNOSIS — G4733 Obstructive sleep apnea (adult) (pediatric): Secondary | ICD-10-CM | POA: Diagnosis not present

## 2018-05-20 DIAGNOSIS — Z1389 Encounter for screening for other disorder: Secondary | ICD-10-CM | POA: Diagnosis not present

## 2018-05-20 DIAGNOSIS — R350 Frequency of micturition: Secondary | ICD-10-CM

## 2018-05-20 DIAGNOSIS — R8290 Unspecified abnormal findings in urine: Secondary | ICD-10-CM

## 2018-05-20 LAB — POCT URINALYSIS DIP (DEVICE)
Glucose, UA: 100 mg/dL — AB
Hgb urine dipstick: NEGATIVE
Leukocytes,Ua: NEGATIVE
NITRITE: NEGATIVE
Protein, ur: 30 mg/dL — AB
Specific Gravity, Urine: >=1.03 (ref 1.005–1.030)
Urobilinogen, UA: 4 mg/dL — ABNORMAL HIGH (ref 0.0–1.0)
pH: 6 (ref 5.0–8.0)

## 2018-05-20 MED ORDER — PREDNISONE 20 MG PO TABS: 40.0000 mg | ORAL_TABLET | Freq: Every day | ORAL | 0 refills | Status: DC

## 2018-05-20 MED ORDER — AMOXICILLIN-POT CLAVULANATE 875-125 MG PO TABS: 1.0000 | ORAL_TABLET | Freq: Two times a day (BID) | ORAL | 0 refills | Status: DC

## 2018-05-20 MED ORDER — ONDANSETRON 4 MG PO TBDP
ORAL_TABLET | ORAL | Status: AC
  Filled 2018-05-20: qty 1

## 2018-05-20 MED ORDER — CEFTRIAXONE SODIUM 1 G IJ SOLR
INTRAMUSCULAR | Status: AC
  Filled 2018-05-20: qty 10

## 2018-05-20 MED ORDER — ONDANSETRON 4 MG PO TBDP
4.0000 mg | ORAL_TABLET | Freq: Once | ORAL | Status: AC
  Administered 2018-05-20: 4 mg via ORAL

## 2018-05-20 MED ORDER — METHYLPREDNISOLONE SODIUM SUCC 125 MG IJ SOLR
INTRAMUSCULAR | Status: AC
  Filled 2018-05-20: qty 2

## 2018-05-20 MED ORDER — METHYLPREDNISOLONE SODIUM SUCC 125 MG IJ SOLR
80.0000 mg | Freq: Once | INTRAMUSCULAR | Status: AC
  Administered 2018-05-20: 80 mg via INTRAMUSCULAR

## 2018-05-20 MED ORDER — CEFTRIAXONE SODIUM 1 G IJ SOLR
1.0000 g | Freq: Once | INTRAMUSCULAR | Status: AC
  Administered 2018-05-20: 1 g via INTRAMUSCULAR

## 2018-05-20 NOTE — Discharge Instructions (Signed)
FINISH ALL OF YOUR ANTIBIOTICS

## 2018-05-20 NOTE — Telephone Encounter (Signed)
Patients mom called at 4pm stating that patient started vomiting about two hours prior.  She states that she usually throws up with her asthma but is afraid that Tamee could be sick because she was around a child recently who was sick.   I could not get patient scheduled this late in the day.  I did however find a 4:30pm at [facility].  Mom said she would not be able to make that appointment in time and that she would not have transportation that soon.   Mom states that patient has two appointments in the morning that she wanted to keep.  She really wanted daughter to feel better before those appointments.  I informed mom of Dr. Frutoso Chase 8am tomorrow morning.  Patient's prior appointments are during that time frame.   Mom states that the person taking her will only take her to [facility] ED's or to [facility].  Mom was going to try [facility] first.   Delaney Meigs, Please follow up in the morning with patient.  Thanks!

## 2018-05-20 NOTE — ED Triage Notes (Signed)
Runny nose and wheezing started 2 days ago.    Has frequent uti.  Mother says patient has uti's every month.  Urine with odor and orange in color and urinating more frequently

## 2018-05-21 ENCOUNTER — Ambulatory Visit (HOSPITAL_COMMUNITY)
Admission: RE | Admit: 2018-05-21 | Discharge: 2018-05-21 | Disposition: A | Discharge status: Home / Self Care | Payer: Medicare Other | Source: Ambulatory Visit | Attending: Family | Admitting: Family

## 2018-05-21 DIAGNOSIS — Z79899 Other long term (current) drug therapy: Secondary | ICD-10-CM | POA: Diagnosis not present

## 2018-05-21 DIAGNOSIS — R259 Unspecified abnormal involuntary movements: Secondary | ICD-10-CM

## 2018-05-21 DIAGNOSIS — F819 Developmental disorder of scholastic skills, unspecified: Secondary | ICD-10-CM | POA: Insufficient documentation

## 2018-05-21 DIAGNOSIS — F840 Autistic disorder: Secondary | ICD-10-CM | POA: Diagnosis not present

## 2018-05-21 DIAGNOSIS — G40209 Localization-related (focal) (partial) symptomatic epilepsy and epileptic syndromes with complex partial seizures, not intractable, without status epilepticus: Secondary | ICD-10-CM | POA: Insufficient documentation

## 2018-05-21 DIAGNOSIS — G40309 Generalized idiopathic epilepsy and epileptic syndromes, not intractable, without status epilepticus: Secondary | ICD-10-CM | POA: Insufficient documentation

## 2018-05-21 NOTE — Telephone Encounter (Signed)
Pt mother returned call to office. Pt mother stated pt did not go to ER and she would like Brianna to call her back so she can explain what happened and what they were told at [facility].

## 2018-05-21 NOTE — Telephone Encounter (Addendum)
Mom called to update the dr on what the UC gave the pt last night.  She states she was instructed to call back at 7:30 am this morning.  She would like a call back.  She states they gave her a shot, and an abx, but she is not sure the pt should take the abx.

## 2018-05-21 NOTE — Telephone Encounter (Signed)
Blood in urine is not a sign that you need antibiotic. She should not take antibiotic if this was the reason per previous note.

## 2018-05-21 NOTE — Progress Notes (Signed)
EEG completed; results pending. Pt did have one episode of twitching in hands and feet which her mother reports was consistent for the events of concern.

## 2018-05-21 NOTE — Telephone Encounter (Signed)
Pt mother call to check status. Pt would like a call back as soon as possible   Cb#[phone]

## 2018-05-21 NOTE — Telephone Encounter (Signed)
Tried calling patient back mailbox full

## 2018-05-21 NOTE — Telephone Encounter (Signed)
There are no notes from the ER visit yet so I cannot tell what main complaints were but urine looked clean so she does not need antibiotic for urine.

## 2018-05-21 NOTE — Telephone Encounter (Signed)
Tried calling patient mailbox is full could not leave a message please give MD response when mother calls back

## 2018-05-21 NOTE — Telephone Encounter (Signed)
Patient was given two shots of ABX at UC there was blood in her urine and was throwing up and was given ABX to take home but it was amoxicillin but states that she is not supposed to take that because of side effects. UC told mother to call PCP and see if she can have urine rechecked after ABX are done.

## 2018-05-21 NOTE — Telephone Encounter (Signed)
Patients mother wanting to know if daughter should take the ABX given at ED

## 2018-05-22 NOTE — Procedures (Signed)
Patient: Lori Miles MRN: [id number] Sex: female DOB: [birth year]  Clinical History: [name] is a 23 y.o. with history of autism spectrum disorder with intellectual disability and language delays, disordered sleep, and a history of generalized convulsive and partial epilepsy.  The patient has experienced a number of behaviors that are concerning for seizure activity.  This EEG was performed to evaluate her for seizures.  Medications: valproic acid (Depakote)  Procedure: The tracing is carried out on a 32-channel digital Natus recorder, reformatted into 16-channel montages with 1 devoted to EKG.  The patient was awake during the recording.  The international 10/20 system lead placement used.  Recording time 25.3 minutes.   Description of Findings: Dominant frequency is 30 V, 8 hz, alpha range activity that is well modulated and well regulated, posteriorly and symmetrically distributed, and attenuates with eye opening.    Background activity consists of low voltage predominantly alpha and beta and upper theta range activity.  Patient does not change state of arousal.  She has a number of behaviors with shaking and tremors movement of her hands, right hand, and right leg none of which were associated with change in EEG activity..  Activating procedures included intermittent photic stimulation.  Intermittent photic stimulation failed to induce a driving response.  Hyperventilation was not performed due to a history of chronic asthma.  EKG showed a sinus tachycardia with a ventricular response of 114 beats per minute.  Impression: This is a normal record with the patient awake.  Normal EEG does not rule out the presence of seizures however the behaviors witnessed were nonepileptic in nature.  Ellison Carwin, MD

## 2018-05-23 ENCOUNTER — Emergency Department (HOSPITAL_COMMUNITY): Payer: Medicare Other

## 2018-05-23 ENCOUNTER — Encounter (HOSPITAL_COMMUNITY): Payer: Self-pay

## 2018-05-23 ENCOUNTER — Other Ambulatory Visit: Payer: Self-pay

## 2018-05-23 ENCOUNTER — Emergency Department (HOSPITAL_COMMUNITY)
Admission: EM | Admit: 2018-05-23 | Discharge: 2018-05-24 | Disposition: A | Discharge status: Home / Self Care | Payer: Medicare Other | Attending: Emergency Medicine | Admitting: Emergency Medicine

## 2018-05-23 DIAGNOSIS — R350 Frequency of micturition: Secondary | ICD-10-CM | POA: Insufficient documentation

## 2018-05-23 DIAGNOSIS — R05 Cough: Secondary | ICD-10-CM | POA: Diagnosis not present

## 2018-05-23 DIAGNOSIS — J45909 Unspecified asthma, uncomplicated: Secondary | ICD-10-CM | POA: Diagnosis not present

## 2018-05-23 DIAGNOSIS — R81 Glycosuria: Secondary | ICD-10-CM

## 2018-05-23 DIAGNOSIS — F840 Autistic disorder: Secondary | ICD-10-CM | POA: Diagnosis not present

## 2018-05-23 DIAGNOSIS — J069 Acute upper respiratory infection, unspecified: Secondary | ICD-10-CM | POA: Insufficient documentation

## 2018-05-23 DIAGNOSIS — Z79899 Other long term (current) drug therapy: Secondary | ICD-10-CM | POA: Diagnosis not present

## 2018-05-23 DIAGNOSIS — F71 Moderate intellectual disabilities: Secondary | ICD-10-CM | POA: Diagnosis not present

## 2018-05-23 LAB — URINALYSIS, ROUTINE W REFLEX MICROSCOPIC
BACTERIA UA: NONE SEEN
Bilirubin Urine: NEGATIVE
Glucose, UA: >=500 mg/dL — AB
Hgb urine dipstick: NEGATIVE
Ketones, ur: 5 mg/dL — AB
Leukocytes,Ua: NEGATIVE
Nitrite: NEGATIVE
Protein, ur: 30 mg/dL — AB
SPECIFIC GRAVITY, URINE: 1.032 — AB (ref 1.005–1.030)
pH: 5 (ref 5.0–8.0)

## 2018-05-23 LAB — CBC WITH DIFFERENTIAL/PLATELET
Abs Immature Granulocytes: 0.12 10*3/uL — ABNORMAL HIGH (ref 0.00–0.07)
Basophils Absolute: 0 10*3/uL (ref 0.0–0.1)
Basophils Relative: 0 %
Eosinophils Absolute: 0 10*3/uL (ref 0.0–0.5)
Eosinophils Relative: 0 %
HCT: 40.4 % (ref 36.0–46.0)
Hemoglobin: 12.4 g/dL (ref 12.0–15.0)
Immature Granulocytes: 1 %
Lymphocytes Relative: 17 %
Lymphs Abs: 1.6 10*3/uL (ref 0.7–4.0)
MCH: 30 pg (ref 26.0–34.0)
MCHC: 30.7 g/dL (ref 30.0–36.0)
MCV: 97.6 fL (ref 80.0–100.0)
Monocytes Absolute: 1.5 10*3/uL — ABNORMAL HIGH (ref 0.1–1.0)
Monocytes Relative: 15 %
NEUTROS PCT: 67 %
Neutro Abs: 6.7 10*3/uL (ref 1.7–7.7)
Platelets: 262 10*3/uL (ref 150–400)
RBC: 4.14 MIL/uL (ref 3.87–5.11)
RDW: 14.6 % (ref 11.5–15.5)
WBC: 9.9 10*3/uL (ref 4.0–10.5)
nRBC: 0 % (ref 0.0–0.2)

## 2018-05-23 LAB — BASIC METABOLIC PANEL
Anion gap: 7 (ref 5–15)
BUN: 20 mg/dL (ref 6–20)
CHLORIDE: 107 mmol/L (ref 98–111)
CO2: 25 mmol/L (ref 22–32)
Calcium: 8.6 mg/dL — ABNORMAL LOW (ref 8.9–10.3)
Creatinine, Ser: 0.89 mg/dL (ref 0.44–1.00)
GFR calc Af Amer: >60 mL/min (ref >60)
GFR calc non Af Amer: >60 mL/min (ref >60)
Glucose, Bld: 124 mg/dL — ABNORMAL HIGH (ref 70–99)
POTASSIUM: 4.1 mmol/L (ref 3.5–5.1)
Sodium: 139 mmol/L (ref 135–145)

## 2018-05-23 LAB — I-STAT BETA HCG BLOOD, ED (MC, WL, AP ONLY): I-stat hCG, quantitative: <5 m[IU]/mL (ref <5)

## 2018-05-23 LAB — CBG MONITORING, ED: Glucose-Capillary: 157 mg/dL — ABNORMAL HIGH (ref 70–99)

## 2018-05-23 MED ORDER — AZITHROMYCIN 250 MG PO TABS
500.0000 mg | ORAL_TABLET | Freq: Once | ORAL | Status: AC
  Administered 2018-05-24: 500 mg via ORAL
  Filled 2018-05-23: qty 2

## 2018-05-23 MED ORDER — AZITHROMYCIN 250 MG PO TABS: 250.0000 mg | ORAL_TABLET | Freq: Every day | ORAL | 0 refills | Status: AC

## 2018-05-23 NOTE — ED Notes (Signed)
Patient transported to X-ray 

## 2018-05-23 NOTE — ED Triage Notes (Addendum)
Patient's mother reports that the patient is currently being treated for a cough and UTI with Amoxicillin and Prednisone (last dose today). Patient's mother states, "I think she needs more Prednisone because we always have more than what they prescribe.  Patient's mother reports that the patient is still having urinary frequency.

## 2018-05-23 NOTE — ED Notes (Signed)
Bed: JO83 Expected date:  Expected time:  Means of arrival:  Comments: Hold per charge

## 2018-05-23 NOTE — Discharge Instructions (Addendum)
Please read and follow all provided instructions.  Your diagnoses today include:  1. Upper respiratory tract infection, unspecified type   2. Urinary frequency   3. Glucosuria      Tests performed today include: Vital signs. See below for your results today.   Medications prescribed:   Take any prescribed medications only as directed. Treatment for your infection is aimed at treating the symptoms. There are no medications, such as antibiotics, that will cure your infection.   Please take all of your antibiotics until finished.   You may develop abdominal discomfort or nausea from the antibiotic. If this occurs, you may take it with food. Some patients also get diarrhea with antibiotics. You may help offset this with probiotics which you can buy or get in yogurt. Do not eat or take the probiotics until 2 hours after your antibiotic. Some women develop vaginal yeast infections after antibiotics. If you develop unusual vaginal discharge after being on this medication, please see your primary care provider.   Some people develop allergies to antibiotics. Symptoms of antibiotic allergy can be mild and include a flat rash and itching. They can also be more serious and include:  ?Hives - Hives are raised, red patches of skin that are usually very itchy.  ?Lip or tongue swelling  ?Trouble swallowing or breathing  ?Blistering of the skin or mouth.  If you have any of these serious symptoms, please seek emergency medical care immediately.  Home care instructions:  Follow any educational materials contained in this packet.   Your illness is contagious and can be spread to others, especially during the first 3 or 4 days. It cannot be cured by antibiotics or other medicines. Take basic precautions such as washing your hands often, covering your mouth when you cough or sneeze, and avoiding public places where you could spread your illness to others.   Please continue drinking plenty of fluids.   Use over-the-counter medicines as needed as directed on packaging for symptom relief.  You may also use ibuprofen or tylenol as directed on packaging for pain or fever.  Do not take multiple medicines containing Tylenol or acetaminophen to avoid taking too much of this medication.  Follow-up instructions: Please follow-up with your primary care provider in the next 3 days for further evaluation of your symptoms if you are not feeling better.   Olanna also needs a recheck of her urine by primary care provider within the next week.  We need to make sure that the sugar is clearing from the urine.  Return instructions:  Please return to the Emergency Department if you experience worsening symptoms.  RETURN IMMEDIATELY IF you develop shortness of breath, chest pain, confusion or altered mental status, a new rash, become dizzy, faint, or poorly responsive, or are unable to be cared for at home. Please return if you have persistent vomiting and cannot keep down fluids or develop a fever that is not controlled by tylenol or motrin.   Please return if you have any other emergent concerns.  Additional Information:  Your vital signs today were: BP 107/71 (BP Location: Right Arm)    Pulse 66    Temp 99.2 F (37.3 C) (Oral)    Resp 18    Ht 4' 9.5" (1.461 m)    Wt 83.9 kg    SpO2 99%    BMI 39.34 kg/m  If your blood pressure (BP) was elevated above 135/85 this visit, please have this repeated by your doctor within one month. --------------

## 2018-05-24 ENCOUNTER — Telehealth: Payer: Self-pay | Admitting: Family Medicine

## 2018-05-24 DIAGNOSIS — J069 Acute upper respiratory infection, unspecified: Secondary | ICD-10-CM | POA: Diagnosis not present

## 2018-05-24 MED ORDER — PREDNISONE 20 MG PO TABS: ORAL_TABLET | ORAL | 0 refills | Status: DC

## 2018-05-24 NOTE — Telephone Encounter (Signed)
On Call note [name] requested to speak with MD about her daughters recent ER visit. Hx of asthma  ER note reviewed.  Dx with Upper respiratory tract infection, unspecified type Urinary frequency  Glucosuria .. glucose in blood 124 , no DM  Added Z-pack to augmentin she is already on for UTI. Per mother.. pt with wheezing, no SOB... need prednisone to stop asthma flare.   Prednisone course is appropriate to add and no clear indication for pt to be re-seen today.  Given borderline glucose and glucosuria.Marland Kitchen recommend following up closely with PCP.  Sent in rx for prednsione taper.

## 2018-05-24 NOTE — ED Provider Notes (Signed)
[facility]-EMERGENCY DEPT Provider Note   CSN: [id number] Arrival date & time: 05/23/18  1629    History   Chief Complaint Chief Complaint  Patient presents with  . Cough  . Hoarse  . Urinary Frequency    HPI Lori Miles is a 23 y.o. female.     HPI   Patient is a 23 year old female with a history of asthma, epilepsy, autism (per mother) presenting for cough and nasal congestion, and urinary frequency.  Patient presents with her mother and father.  Patient's mother provides most of the history, but patient does answer questions and is verbal.  According to family, patient has had a approximately 4 to 5-day history of nasal congestion and cough.  She was seen in urgent care in 05-20-2018 and prescribed Augmentin and given a shot of Rocephin.  She was also prescribed a course of prednisone.  Patient's mother believes that this prednisone needs to be extended.  She feels that the patient is wheezing.  Patient is in no distress, and denying any shortness of breath or wheezing here.  No fevers at home.  Patient has chronic vomiting, and takes Zofran for this.  No increased vomiting.  Patient's mother does report urinary frequency.  No poly-dips ER.  Patient does not have a history of diabetes.  She has had a history of glucosuria per the patient's mother, and is being monitored by primary care provider.  Past Medical History:  Diagnosis Date  . Asthma   . Autism    mom states no actual dx, but admits she has some symptoms  . Bacteriuria   . Chronic constipation   . Cognitive developmental delay   . Disruptive behavior disorder   . Family history of adverse reaction to anesthesia    mother had itching after anesthesia one time  . Frequency-urgency syndrome   . Gait disorder    ORGANIC PER NERUOLGIST NOTE (DR HICKLING)  . 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 GERD (gastroesophageal reflux disease)   . Gout   . H/O sinus  tachycardia   . History of acute respiratory failure 02/14/2015   SECONDARY TO CAP  . History of recurrent UTIs   . Interstitial cystitis   . Moderate intellectual disability   . OSA (obstructive sleep apnea)    no machine yet   . Partial epilepsy with impairment of consciousness (HCC)    FOLLOWED BY DR HICKLING  . Pneumonia 2018 last time  . PONV (postoperative nausea and vomiting)   . Seizure (HCC)    most recent sz " at the end of summer"-last sz years ago per mother  . Urinary incontinence     Patient Active Problem List   Diagnosis Date Noted  . Not well controlled moderate persistent asthma 04/29/2018  . Gastroesophageal reflux disease 04/29/2018  . Recurrent infections 04/29/2018  . Dysuria 02/20/2018  . Chronic rhinitis 04/03/2017  . Recurrent falls 03/21/2017  . Weakness 03/21/2017  . Venous insufficiency 03/21/2017  . Excessive daytime sleepiness 03/05/2016  . 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 Cough variant asthma vs UACS/ vcd  03/15/2015  . Autism spectrum disorder 02/14/2015  . Nausea with vomiting 02/14/2015  . Obesity, morbid (HCC) 12/13/2014  . 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 Acanthosis nigricans 11/14/2014  . Toeing-in 08/29/2014  . 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 Partial epilepsy with impairment of  consciousness (HCC) 09/28/2012  . 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 Recurrent urinary tract infection 08/01/2011  . Asthma 08/01/2011  . Chronic constipation 08/01/2011  . Contraception 08/01/2011    Past Surgical History:  Procedure Laterality Date  . CYSTOSCOPY N/A 06/03/2017   Procedure: CYSTOSCOPY WITH EXAM UNDER ANESTHESIA;  Surgeon: Jerilee Field, MD;  Location: [facility];  Service: Urology;  Laterality: N/A;  . CYSTOSCOPY WITH HYDRODISTENSION AND BIOPSY  04/14/2012   Procedure:  CYSTOSCOPY/BIOPSY/HYDRODISTENSION;  Surgeon: Lindaann Slough, MD;  Location: [facility];  Service: Urology;;  instillation of marcaine and pyridium  . EUA/  PAP SMEAR/  BREAST EXAM  03-12-2016   dr Erin Fulling [facility]  . EUA/ VAGINOSCOPY/ COLPOSCOPY FO THE HYMENAL RING  10-04-2009    dr Tamela Oddi  [facility]  . TONSILLECTOMY       OB History   No obstetric history on file.      Home Medications    Prior to Admission medications   Medication Sig Start Date End Date Taking? Authorizing Provider  acetaminophen (TYLENOL) 325 MG tablet Take 2 tablets (650 mg total) by mouth every 6 (six) hours as needed for mild pain or moderate pain. 02/27/17   Everlene Farrier, PA-C  albuterol (PROVENTIL) (2.5 MG/3ML) 0.083% nebulizer solution INHALE CONTENTS OF 1 VIAL IN NEBULIZER EVERY 4 TO 6 HOURS IF NEEDED FOR COUGH OR WHEEZING 03/17/18   Kozlow, Alvira Philips, MD  amoxicillin-clavulanate (AUGMENTIN) 875-125 MG tablet Take 1 tablet by mouth every 12 (twelve) hours. 05/20/18   Arnaldo Natal, MD  azithromycin (ZITHROMAX) 250 MG tablet Take 1 tablet (250 mg total) by mouth daily for 4 days. 05/23/18 05/27/18  Aviva Kluver B, PA-C  benzonatate (TESSALON) 100 MG capsule Take 2 capsules (200 mg total) by mouth 2 (two) times daily as needed for cough. 12/18/17   Bast, Traci A, NP  budesonide (PULMICORT) 1 MG/2ML nebulizer solution Take 2 mLs (1 mg total) by nebulization 4 (four) times daily. During upper respiratory infections and asthma flares for 1-2 weeks. 04/29/18   Ellamae Sia, DO  cetirizine (ZYRTEC) 10 MG tablet Take 1 tablet (10 mg total) by mouth daily. 03/16/18   Kozlow, Alvira Philips, MD  cloNIDine (CATAPRES) 0.1 MG tablet TAKE 1 AND 1/2 TABLET BY MOUTH AT BEDTIME Patient taking differently: Take 0.15 mg by mouth at bedtime.  02/20/18   Elveria Rising, NP  DEPAKOTE 500 MG DR tablet TAKE 1 TABLET BY MOUTH IN THE MORNING AND 2 AT NIGHT Patient taking differently: Take 500-1,000 mg by mouth as directed. TAKE  1 (500 MG) TABLET BY MOUTH IN THE MORNING AND 2 (1000 MG) tablet AT NIGHT 03/16/18   Elveria Rising, NP  famotidine (PEPCID) 20 MG tablet Take 1 tablet (20 mg total) by mouth every 12 (twelve) hours. Pharmacy-please d/c rx for ranitidine 03/19/18   Pyrtle, Carie Caddy, MD  formoterol (PERFOROMIST) 20 MCG/2ML nebulizer solution Take 2 mLs (20 mcg total) by nebulization 2 (two) times daily. 03/16/18   Kozlow, Alvira Philips, MD  Levonorgestrel-Ethinyl Estradiol (SEASONIQUE) 0.15-0.03 &0.01 MG tablet Take 1 tablet by mouth daily. 01/29/18   Sharyon Cable, CNM  Melatonin 3 MG TABS Take 3 mg by mouth at bedtime as needed (for sleep).     [provider]  mometasone (NASONEX) 50 MCG/ACT nasal spray Place 2 sprays into the nose daily. 03/17/18   Kozlow, Alvira Philips, MD  montelukast (SINGULAIR) 10 MG tablet Take 1 tablet (10 mg total) by mouth at bedtime.  03/16/18   Kozlow, Alvira Philips, MD  ondansetron (ZOFRAN-ODT) 4 MG disintegrating tablet DISSOLVE 1 TABLET(4 MG) ON THE TONGUE EVERY 8 HOURS AS NEEDED FOR NAUSEA OR VOMITING 04/30/18   Myrlene Broker, MD  polyethylene glycol powder (GLYCOLAX/MIRALAX) powder Dissolve 17 grasm in at least 8 ounces water/juice and drink by mouth twice daily Patient taking differently: Take 17 g by mouth daily as needed for mild constipation.  02/18/18   Pyrtle, Carie Caddy, MD  predniSONE (DELTASONE) 20 MG tablet Take 2 tablets (40 mg total) by mouth daily. 05/20/18   Arnaldo Natal, MD  Revefenacin (YUPELRI) 175 MCG/3ML SOLN Inhale 1 vial into the lungs daily. 03/19/18   Kozlow, Alvira Philips, MD  senna (SENOKOT) 8.6 MG tablet Take 1 tablet by mouth at bedtime.     [provider]  terconazole (TERAZOL 3) 0.8 % vaginal cream USE TOPICALLY IF NEEDED FOR YEAST 04/20/18   [provider]  traZODone (DESYREL) 50 MG tablet Take 2 tablets (100 mg total) by mouth at bedtime. 02/20/18   Elveria Rising, NP    Family History Family History  Problem Relation Age of Onset  . Seizures  Mother   . Seizures Sister        1 sister has  . Pancreatic cancer Sister   . Colon cancer Neg Hx   . Colon polyps Neg Hx   . Esophageal cancer Neg Hx   . Rectal cancer Neg Hx   . Stomach cancer Neg Hx     Social History Social History   Tobacco Use  . Smoking status: Never Smoker  . Smokeless tobacco: Never Used  Substance Use Topics  . Alcohol use: No    Alcohol/week: 0.0 standard drinks  . Drug use: No     Allergies   Abilify [aripiprazole]; Seroquel [quetiapine fumarate]; Bactrim [sulfamethoxazole-trimethoprim]; Doxycycline; Amoxicillin-pot clavulanate; and Keppra [levetiracetam]   Review of Systems Review of Systems  Constitutional: Negative for chills and fever.  HENT: Positive for congestion and rhinorrhea. Negative for sinus pain and sore throat.   Eyes: Negative for visual disturbance.  Respiratory: Positive for cough. Negative for chest tightness and shortness of breath.   Cardiovascular: Negative for chest pain.  Gastrointestinal: Positive for vomiting. Negative for abdominal pain, diarrhea and nausea.  Genitourinary: Negative for dysuria and flank pain.  Musculoskeletal: Negative for back pain and myalgias.  Skin: Negative for rash.  Neurological: Negative for dizziness, syncope, light-headedness and headaches.     Physical Exam Updated Vital Signs BP 115/83   Pulse 95   Temp 99.2 F (37.3 C) (Oral)   Resp 20   Ht 4' 9.5" (1.461 m)   Wt 83.9 kg   SpO2 98%   BMI 39.34 kg/m   Physical Exam Vitals signs and nursing note reviewed.  Constitutional:      General: She is not in acute distress.    Appearance: She is well-developed.  HENT:     Head: Normocephalic and atraumatic.     Right Ear: Tympanic membrane normal.     Left Ear: Tympanic membrane normal.     Nose: No congestion or rhinorrhea.     Mouth/Throat:     Comments: Normal phonation. No muffled voice sounds. Patient swallows secretions without difficulty. Dentition normal. No lesions  of tongue or buccal mucosa. Uvula midline. No asymmetric swelling of the posterior pharynx. No erythema of posterior pharynx. No tonsillar exuduate. No lingual swelling. No induration inferior to tongue. No submandibular tenderness, swelling, or induration.  Tissues  of the neck supple. No cervical lymphadenopathy. Right TM without erythema or effusion; left TM without erythema or effusion.  Eyes:     Conjunctiva/sclera: Conjunctivae normal.     Pupils: Pupils are equal, round, and reactive to light.  Neck:     Musculoskeletal: Normal range of motion and neck supple.  Cardiovascular:     Rate and Rhythm: Normal rate and regular rhythm.     Heart sounds: S1 normal and S2 normal. No murmur.  Pulmonary:     Effort: Pulmonary effort is normal.     Breath sounds: Normal breath sounds. No wheezing or rales.  Abdominal:     General: There is no distension.     Palpations: Abdomen is soft.     Tenderness: There is no abdominal tenderness. There is no guarding.  Musculoskeletal: Normal range of motion.        General: No deformity.  Lymphadenopathy:     Cervical: No cervical adenopathy.  Skin:    General: Skin is warm and dry.     Findings: No erythema or rash.  Neurological:     Mental Status: She is alert.     Comments: Cranial nerves grossly intact. Patient moves extremities symmetrically and with good coordination.  Psychiatric:        Behavior: Behavior normal.        Thought Content: Thought content normal.        Judgment: Judgment normal.      ED Treatments / Results  Labs (all labs ordered are listed, but only abnormal results are displayed) Labs Reviewed  URINALYSIS, ROUTINE W REFLEX MICROSCOPIC - Abnormal; Notable for the following components:      Result Value   Specific Gravity, Urine 1.032 (*)    Glucose, UA >=500 (*)    Ketones, ur 5 (*)    Protein, ur 30 (*)    All other components within normal limits  BASIC METABOLIC PANEL - Abnormal; Notable for the following  components:   Glucose, Bld 124 (*)    Calcium 8.6 (*)    All other components within normal limits  CBC WITH DIFFERENTIAL/PLATELET - Abnormal; Notable for the following components:   Monocytes Absolute 1.5 (*)    Abs Immature Granulocytes 0.12 (*)    All other components within normal limits  CBG MONITORING, ED - Abnormal; Notable for the following components:   Glucose-Capillary 157 (*)    All other components within normal limits  URINE CULTURE  I-STAT BETA HCG BLOOD, ED ([facility], [facility], [facility] ONLY)    EKG None  Radiology Dg Chest 2 View  Result Date: 05/23/2018 CLINICAL DATA:  Cough and UTI.  Patient on antibiotics. EXAM: CHEST - 2 VIEW COMPARISON:  April 28, 2018 FINDINGS: The study is limited due to low volumes. The cardiomediastinal silhouette is stable. No pneumothorax. Increased interstitial markings centrally. No other abnormalities. IMPRESSION: Increased lung markings centrally could be due to the low lung volumes, pulmonary venous congestion, or an atypical infection. Electronically Signed   By: Gerome Sam III M.D   On: 05/23/2018 22:25    Procedures Procedures (including critical care time)  Medications Ordered in ED Medications  azithromycin [facility]) tablet 500 mg (500 mg Oral Given 05/24/18 0008)     Initial Impression / Assessment and Plan / ED Course  I have reviewed the triage vital signs and the nursing notes.  Pertinent labs & imaging results that were available during my care of the patient were reviewed by me and considered in  my medical decision making (see chart for details).  Clinical Course as of May 23 36  Sat May 23, 2018  2233 May be transiently steroid induced. Will have PCP follow up on.   Glucose, UA(!): >=500 [AM]    Clinical Course User Index [AM] [name] Ponder, PA-C       Patient is nontoxic-appearing, hemodynamically stable, and in no acute distress.  She is resting on my examination, and woke up for exam but quickly went back to  sleep.  She does not appear to have any increased work of breathing, congestion, and has a nonfocal lung exam.  Patient does have soft rhonchi.  No wheezing.  I do not feel that further prednisone will be indicated.  Lab work performed due to glucosuria greater than 500.  Patient has been on a course of prednisone, and may be steroid induced and transient.  Patient has no excessive hyperglycemia on lab work, evidence of acidemia nor anion gap.  Do not feel that this is a new onset of diabetes.  Feel that patient will require follow-up with primary care for repeat urine.  Chest x-ray is demonstrating low lung volumes, but possible atypical lung infection versus pulmonary edema.  Do not feel that patient's presentation is indicative of pulmonary edema as she is not fluid overloaded on exam.  Patient is already on Augmentin.  Feel that a course of azithromycin is reasonable given the chest x-ray finding.  Patient is on Zofran chronically.  EKG from 2 days ago reviewed and shows no prolonged QTC.  Patient given return precautions for any increased work of breathing, wheezing, inability to tolerate p.o. or any new or worsening symptoms.  Patient and family are in understanding and agree with the plan of care.  Final Clinical Impressions(s) / ED Diagnoses   Final diagnoses:  Upper respiratory tract infection, unspecified type  Urinary frequency  Glucosuria    ED Discharge Orders         Ordered    azithromycin (ZITHROMAX) 250 MG tablet  Daily     05/23/18 2344           [name] Ponder, PA-C 05/24/18 0045    Melene Plan, DO 05/24/18 1501

## 2018-05-25 ENCOUNTER — Telehealth (INDEPENDENT_AMBULATORY_CARE_PROVIDER_SITE_OTHER): Payer: Self-pay | Admitting: Family

## 2018-05-25 ENCOUNTER — Ambulatory Visit: Payer: Medicare Other | Attending: Family | Admitting: Physical Therapy

## 2018-05-25 DIAGNOSIS — R2681 Unsteadiness on feet: Secondary | ICD-10-CM | POA: Diagnosis not present

## 2018-05-25 DIAGNOSIS — M6281 Muscle weakness (generalized): Secondary | ICD-10-CM

## 2018-05-25 DIAGNOSIS — R2689 Other abnormalities of gait and mobility: Secondary | ICD-10-CM

## 2018-05-25 LAB — URINE CULTURE: Culture: 30000 — AB

## 2018-05-25 NOTE — Therapy (Signed)
[facility] [address] [place], [place], [address] Phone: [phone]   Fax:  [phone]  Physical Therapy Treatment  Patient Details  Name: Lori Miles MRN: [id number] Date of Birth: 07-Apr-1995 Referring Provider (PT): Rockwell Germany, NP   Encounter Date: 05/25/2018  PT End of Session - 05/25/18 1939    Visit Number  5    Number of Visits  9    Date for PT Re-Evaluation  06/16/18    Authorization Type  [employer] Medicare    Authorization Time Period  04-14-18 - 07-11-18    PT Start Time  0801    PT Stop Time  0845    PT Time Calculation (min)  44 min    Equipment Utilized During Treatment  Gait belt    Activity Tolerance  Patient tolerated treatment well    Behavior During Therapy  [facility] for tasks assessed/performed       Past Medical History:  Diagnosis Date  . Asthma   . Autism    mom states no actual dx, but admits she has some symptoms  . Bacteriuria   . Chronic constipation   . Cognitive developmental delay   . Disruptive behavior disorder   . Family history of adverse reaction to anesthesia    mother had itching after anesthesia one time  . Frequency-urgency syndrome   . Gait disorder    ORGANIC PER NERUOLGIST NOTE (DR HICKLING)  . Generalized convulsive epilepsy ([place]) NEUROLOGIST-  DR HICKLING  . GERD (gastroesophageal reflux disease)   . Gout   . H/O sinus tachycardia   . History of acute respiratory failure 02/14/2015   SECONDARY TO CAP  . History of recurrent UTIs   . Interstitial cystitis   . Moderate intellectual disability   . OSA (obstructive sleep apnea)    no machine yet   . Partial epilepsy with impairment of consciousness ([place])    FOLLOWED BY DR HICKLING  . Pneumonia 2018 last time  . PONV (postoperative nausea and vomiting)   . Seizure ([place])    most recent sz " at the end of summer"-last sz years ago per mother  . Urinary incontinence     Past Surgical History:  Procedure  Laterality Date  . CYSTOSCOPY N/A 06/03/2017   Procedure: CYSTOSCOPY WITH EXAM UNDER ANESTHESIA;  Surgeon: Festus Aloe, MD;  Location: [facility];  Service: Urology;  Laterality: N/A;  . CYSTOSCOPY WITH HYDRODISTENSION AND BIOPSY  04/14/2012   Procedure: CYSTOSCOPY/BIOPSY/HYDRODISTENSION;  Surgeon: Hanley Ben, MD;  Location: [facility];  Service: Urology;;  instillation of marcaine and pyridium  . EUA/  PAP SMEAR/  BREAST EXAM  03-12-2016   dr Ihor Dow [name]  . EUA/ VAGINOSCOPY/ COLPOSCOPY FO THE HYMENAL RING  10-04-2009    dr Delsa Sale  [facility]  . TONSILLECTOMY      There were no vitals filed for this visit.  Subjective Assessment - 05/25/18 1926    Subjective  Mother states pt was seen in ED on Saturday (05-23-18) and is now being treated for pneumonia; mother states that pt's legs are shaking worse now that she is sick - says the tremors and weakness get worse when pt is sick     Patient is accompained by:  Family member   mother   Pertinent History  Cognitive developmentally delayed:  recurrent falls:  weakness:  tremor, unspecified:  generalized convulsive epilepsy;  autism spectrum disorder    Patient Stated Goals  improve walking and  try to stop the tremors    Currently in Pain?  Other (Comment)   pt points to Rt lateral lower leg when asked if she is having pain   Pain Score  --   unable to rate   Pain Location  Leg    Pain Orientation  Right;Lateral;Lower    Pain Type  Chronic pain                       [facility] Adult PT Treatment/Exercise - 05/25/18 6063      Ambulation/Gait   Ambulation/Gait  Yes    Ambulation/Gait Assistance  4: Min guard   hand held assist as pt requests to hold PT's hand   Ambulation Distance (Feet)  230 Feet   125' 2nd rep   Assistive device  1 person hand held assist    Gait Pattern  Step-through pattern   LLE internally rotates with foot flat contact at stance   Ambulation Surface   Level;Indoor    Stairs  Yes    Stairs Assistance  5: Supervision    Stair Management Technique  Two rails;Alternating pattern;Step to pattern   step to pattern with descension   Number of Stairs  4    Height of Stairs  6      Knee/Hip Exercises: Aerobic   Recumbent Bike  SciFit level 2.0 x 6 1/2"       Knee/Hip Exercises: Standing   Forward Step Up  Right;Left;1 set;5 reps;Hand Hold: 2;Step Height: 6"   5 reps on each leg    Other Standing Knee Exercises  Pt performed stepping over and back of  black balance beam 5 reps each leg with min hand held assist; pt internally rotated LLE when lifting leg over beam to floor - unable to maintain LLE in neutral position     Other Standing Knee Exercises  Pt performed tap ups to 4" step 5 reps with each foot with min hand held assist       Knee/Hip Exercises: Seated   Long Arc Quad  Strengthening;Both;1 set;10 reps;Weights   3#     Pt performed zoom ball activity in standing to facilitate LE weight bearing with dynamic balance activity - pt braced legs  Against mat table for stability             PT Long Term Goals - 05/25/18 1941      PT LONG TERM GOAL #1   Title  Pt. will amb. 500' on flat, even surface without LOB.      Baseline  230' on 05-25-18 - mother states pt's legs are weaker today due to illness (mother states she has pneumonia)    Time  4    Period  Weeks    Status  On-going    Target Date  06/16/18      PT LONG TERM GOAL #2   Title  Pt will perform at least 10" on recumbent bike nonstop for improved endurance/activity tolerance.    Baseline  6 1/2" on 05-25-18    Time  4    Period  Weeks    Status  Partially Met    Target Date  06/16/18      PT LONG TERM GOAL #3   Title  Mother will verbalize understanding of HEP to assist with LE strengthening.      Baseline  met 05-25-18    Status  Achieved  Plan - 05/25/18 1946    Clinical Impression Statement  Pt continues to have intermittent occurrences of  LE instability with shaking/tremors but not to severe extent which pt's mother describes occurring at home in which she must sit down to avoid falling down.  Mother states the tremors are worse when pt is sick (mother states pt currently has pneumonia and was seen in ED on 05-23-18).  LTG's are partially met but not fully met due to LE weakness with tremors occasionally.  RLE was noted to tremor in today's session - in previous PT sessions only LLE has been noted to have the tremors.      Rehab Potential  Good    PT Frequency  1x / week    PT Duration  4 weeks   renewal completed for 4 additional visits - 05-25-18   PT Treatment/Interventions  ADLs/Self Care Home Management;DME Instruction;Gait training;Stair training;Orthotic Fit/Training;Patient/family education;Neuromuscular re-education;Balance training;Therapeutic exercise;Therapeutic activities    PT Next Visit Plan  cont LE strengthening    Consulted and Agree with Plan of Care  Patient       Patient will benefit from skilled therapeutic intervention in order to improve the following deficits and impairments:  Abnormal gait, Decreased balance, Decreased strength, Decreased activity tolerance, Decreased endurance, Decreased safety awareness, Pain, Decreased coordination  Visit Diagnosis: Other abnormalities of gait and mobility  Unsteadiness on feet  Muscle weakness (generalized)     Problem List Patient Active Problem List   Diagnosis Date Noted  . Not well controlled moderate persistent asthma 04/29/2018  . Gastroesophageal reflux disease 04/29/2018  . Recurrent infections 04/29/2018  . Dysuria 02/20/2018  . Chronic rhinitis 04/03/2017  . Recurrent falls 03/21/2017  . Weakness 03/21/2017  . Venous insufficiency 03/21/2017  . Excessive daytime sleepiness 03/05/2016  . 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 Cough variant asthma vs UACS/ vcd  03/15/2015  . Autism spectrum disorder 02/14/2015  . Nausea with vomiting  02/14/2015  . Obesity, morbid ([place]) 12/13/2014  . 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 Acanthosis nigricans 11/14/2014  . Toeing-in 08/29/2014  . Generalized convulsive epilepsy ([place]) 09/28/2012  . Partial epilepsy with impairment of consciousness ([place]) 09/28/2012  . 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 Recurrent urinary tract infection 08/01/2011  . Asthma 08/01/2011  . Chronic constipation 08/01/2011  . Contraception 08/01/2011    Alda Lea, [place]  05/25/2018, 7:55 PM  [facility] [address] [place], [place], [address] Phone: [phone]   Fax:  [phone]  Name: Lori Miles MRN: [id number] Date of Birth: 02-Jan-1996

## 2018-05-25 NOTE — Telephone Encounter (Signed)
I called normal EEG results to Mom. She was relieved that it was normal but was asking again for an MRI.  Mom said that Lori Miles was currently being treated for pneumonia and that her symptoms are worse since she has been sick. Lori Miles has an appointment in this office tomorrow. I will talk with her again tomorrow about Lori Miles's problems. TG

## 2018-05-25 NOTE — Telephone Encounter (Signed)
Noted  

## 2018-05-26 ENCOUNTER — Encounter (INDEPENDENT_AMBULATORY_CARE_PROVIDER_SITE_OTHER): Payer: Self-pay | Admitting: Family

## 2018-05-26 ENCOUNTER — Telehealth: Payer: Self-pay

## 2018-05-26 ENCOUNTER — Ambulatory Visit (INDEPENDENT_AMBULATORY_CARE_PROVIDER_SITE_OTHER): Payer: Medicare Other | Admitting: Family

## 2018-05-26 VITALS — BP 110/80 | HR 88 | Ht 58.5 in | Wt 183.2 lb

## 2018-05-26 DIAGNOSIS — R531 Weakness: Secondary | ICD-10-CM

## 2018-05-26 NOTE — Progress Notes (Signed)
ANIJAH SPOHR   MRN:  [id number]  01-17-1996   Provider: Elveria Rising NP-C Location of Care: [facility] Child Neurology  Visit type: Routine visit  Last visit: 04/28/2018  Referral source: Fleet Contras, MD History from: mother and [facility] chart  Brief history:  Generalized and convulsive epilepsy, intellectual disability, autism spectrum disorder, disordered sleep and gait disorder  Today's concerns: Ongoing problems with gait, shaking movements of her extremities when walking, recurrent pneumonia diagnosed a few days ago. Jisselle has been going to physical therapy and while she has made some progress, she continues to have intermittent problems with becoming fatigued when walking and tremulous movements of left leg when this occurs. Mom said that various assistive devices have been tried but Mickey has been unable to learn to use any of then. She walks fairly well with a gait belt and person assistance. An EEG was performed last week to determine if any of the movements were epileptic in nature The EEG was normal and behaviors captured during the EEG did not correlate with change in EEG pattern. Mom continues to be fearful that there is something wrong with Nitasha's nerves or brain causing these problems. She asked for a blood test today to check for muscle problems.   Review of systems: Please see HPI for neurologic and other pertinent review of systems. Otherwise all other systems were reviewed and were negative.  Problem List: Patient Active Problem List   Diagnosis Date Noted  . Not well controlled moderate persistent asthma 04/29/2018  . Gastroesophageal reflux disease 04/29/2018  . Recurrent infections 04/29/2018  . Dysuria 02/20/2018  . Chronic rhinitis 04/03/2017  . Recurrent falls 03/21/2017  . Weakness 03/21/2017  . Venous insufficiency 03/21/2017  . Excessive daytime sleepiness 03/05/2016  . 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 Cough variant asthma vs  UACS/ vcd  03/15/2015  . Autism spectrum disorder 02/14/2015  . Nausea with vomiting 02/14/2015  . Obesity, morbid ([facility]) 12/13/2014  . 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 Acanthosis nigricans 11/14/2014  . Toeing-in 08/29/2014  . 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 Recurrent urinary tract infection 08/01/2011  . Asthma 08/01/2011  . Chronic constipation 08/01/2011  . Contraception 08/01/2011     Past Medical History:  Diagnosis Date  . Asthma   . Autism    mom states no actual dx, but admits she has some symptoms  . Bacteriuria   . Chronic constipation   . Cognitive developmental delay   . Disruptive behavior disorder   . Family history of adverse reaction to anesthesia    mother had itching after anesthesia one time  . Frequency-urgency syndrome   . Gait disorder    ORGANIC PER NERUOLGIST NOTE (DR HICKLING)  . 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 GERD (gastroesophageal reflux disease)   . Gout   . H/O sinus tachycardia   . History of acute respiratory failure 02/14/2015   SECONDARY TO CAP  . History of recurrent UTIs   . Interstitial cystitis   . Moderate intellectual disability   . OSA (obstructive sleep apnea)    no machine yet   . Partial epilepsy with impairment of consciousness ([facility])    FOLLOWED BY DR HICKLING  . Pneumonia 2018 last time  . PONV (postoperative nausea and vomiting)   .  Seizure ([facility])    most recent sz " at the end of summer"-last sz years ago per mother  . Urinary incontinence     Past medical history comments: See HPI Copied from previous record: EEG on September 14, 2011, was normal. She was able to be successfully weaned off Keppra. The patient has significant issues with aggression,  which was thought to be related to Keppra, but has persisted once it was discontinued. The patient was treated with Depakote both for behavior and possible seizures. She had palpitations and severe constipation on Seroquel. She had marked weight gain on Abilify and Depakote. When generic Divalproex was tried she had breakthrough seizures.   Nocturnal polysomnogram on October 27, 2011, failed to show significant periodic limb movements, cyanosis, sleep apnea, or cardiac arrhythmia. EEG on February 20, 2012, was a normal study with the patient awake.   EEG performed March 25, 2017 was normal  EEG performed May 21, 2018 was normal  Surgical history: Past Surgical History:  Procedure Laterality Date  . CYSTOSCOPY N/A 06/03/2017   Procedure: CYSTOSCOPY WITH EXAM UNDER ANESTHESIA;  Surgeon: Jerilee Field, MD;  Location: [facility];  Service: Urology;  Laterality: N/A;  . CYSTOSCOPY WITH HYDRODISTENSION AND BIOPSY  04/14/2012   Procedure: CYSTOSCOPY/BIOPSY/HYDRODISTENSION;  Surgeon: Lindaann Slough, MD;  Location: [facility];  Service: Urology;;  instillation of marcaine and pyridium  . EUA/  PAP SMEAR/  BREAST EXAM  03-12-2016   dr Erin Fulling [facility]  . EUA/ VAGINOSCOPY/ COLPOSCOPY FO THE HYMENAL RING  10-04-2009    dr Tamela Oddi  [facility]  . TONSILLECTOMY       Family history: family history includes Pancreatic cancer in her sister; Seizures in her mother and sister.   Social history: Social History   Socioeconomic History  . Marital status: Single    Spouse name: Not on file  . Number of children: Not on file  . Years of education: Not on file  . Highest education level: Not on file  Occupational History  . Not on file  Social Needs  . Financial resource strain: Not on file  . Food insecurity:    Worry: Not on file    Inability: Not on file  . Transportation needs:    Medical: Not on file    Non-medical: Not on file  Tobacco Use  . Smoking  status: Never Smoker  . Smokeless tobacco: Never Used  Substance and Sexual Activity  . Alcohol use: No    Alcohol/week: 0.0 standard drinks  . Drug use: No  . Sexual activity: Never    Birth control/protection: Pill  Lifestyle  . Physical activity:    Days per week: Not on file    Minutes per session: Not on file  . Stress: Not on file  Relationships  . Social connections:    Talks on phone: Not on file    Gets together: Not on file    Attends religious service: Not on file    Active member of club or organization: Not on file    Attends meetings of clubs or organizations: Not on file    Relationship status: Not on file  . Intimate partner violence:    Fear of current or ex partner: Not on file    Emotionally abused: Not on file    Physically abused: Not on file    Forced sexual activity: Not on file  Other Topics Concern  . Not on file  Social History Narrative   Lives  with mom ([name] Fabro) and half-sister Caiyah Loven) who is also mentally impaired.  Has CAP assistance, urinary incontinence, needs help with feeding,dressing, toileting   Graduated from [employer].  She enjoys playing with cars, dancing, and dogs.     Allergies: Allergies  Allergen Reactions  . Abilify [Aripiprazole] Swelling and Palpitations  . Seroquel [Quetiapine Fumarate] Palpitations  . Bactrim [Sulfamethoxazole-Trimethoprim] Other (See Comments)    Abdominal pain  . Doxycycline Other (See Comments)    Stomach cramps  . Amoxicillin-Pot Clavulanate Hives    Has patient had a PCN reaction causing immediate rash, facial/tongue/throat swelling, SOB or lightheadedness with hypotension: No Has patient had a PCN reaction causing severe rash involving mucus membranes or skin necrosis: No Has patient had a PCN reaction that required hospitalization: No Has patient had a PCN reaction occurring within the last 10 years: No If all of the above answers are "NO", then may proceed with  Cephalosporin use.   Marland Kitchen Keppra [Levetiracetam] Other (See Comments)    Aggressive behavior       Immunizations: Immunization History  Administered Date(s) Administered  . Pneumococcal Polysaccharide-23 08/06/2016  . Tdap 03/03/2017      Diagnostics/Screenings:  03/27/17 - CT head - normal   Physical Exam: BP 110/80   Pulse 88   Ht 4' 10.5" (1.486 m)   Wt 183 lb 3.2 oz (83.1 kg)   BMI 37.64 kg/m   General: well developed, well nourished obese woman, lying on exam table, in no evident distress; black hair, brown eyes, right handed Head: normocephalic and atraumatic. Oropharynx benign. No dysmorphic features. Neck: supple with no carotid bruits. Cardiovascular: regular rate and rhythm, no murmurs. Respiratory: Clear to auscultation bilaterally Abdomen: Bowel sounds present all four quadrants, abdomen soft, non-tender, non-distended. No hepatosplenomegaly or masses palpated. Musculoskeletal: No skeletal deformities or obvious scoliosis.  Skin: no rashes or neurocutaneous lesions  Neurologic Exam Mental Status: Awake and fully alert. Language is limited. Has variable eye contact. Smiles responsively. Fairly tolerant of invasions in to her space. Holding a baby doll in her arms during the entire visit. She was able to follow some simple directions but needed frequent coaching and redirection.  Cranial Nerves: Fundoscopic exam - red reflex present.  Unable to fully visualize fundus.  Pupils equal briskly reactive to light.  Turns to localize faces and objects in the periphery. Turns to localize sounds in the periphery. Facial movements are symmetric. Motor: Normal functional bulk, tone and strength. I saw no shaking of her extremities today. Sensory: Withdrawal x 4 Coordination: Unable to adequately assess due to patient's inability to participate in examination. No dysmetria when reaching for objects. Gait and Station: Able to independently stand and bear weight. She turns the left  toe in when walking. Holds her doll in her arms when walking. Reflexes: Diminished and symmetric. Toes neutral. No clonus  Impression: 1. Partial epilepsy with impairment of consciousness 2. Generalized convulsive epilepsy 3. Intellectual disability 4. Insomnia and sleep arousals 5. Gait disorder 6. Intermittent tremors 7. Autism spectrum disorder 8. At risk for falls  Recommendations for plan of care: The patient's previus [facility] records were reviewed. Talina has neither had nor required imaging or lab studies since the last visit, other than what was performed in the ER last week and the EEG performed last week. Mom is aware of all the results. Beth currently has a respiratory illness and is receiving antibiotic and prednisone. She tends to have more fatigue and difficulty walking when she is  sick. [name] tends to sit down when she gets tired walking, and I talked with Mom about using a wheelchair when in public for Alisa's safety. Mom asked about a blood test to check her muscles and I agreed to order a CK level. I attempted to reassure Mom that as we have discussed in the past, Yicel's examination is normal and there is no indication for imaging. Mom continues to be fearful that [name] has a disorder such as MS but agreed to get the CK level and continue with physical therapy. I will see Shadee back in follow up in 2 months or sooner if needed. Mom agreed with the plans made today.  The medication list was reviewed and reconciled. No changes were made in the prescribed medications today. A complete medication list was provided to the patient's mother.  Allergies as of 05/26/2018      Reactions   Abilify [aripiprazole] Swelling, Palpitations   Seroquel [quetiapine Fumarate] Palpitations   Bactrim [sulfamethoxazole-trimethoprim] Other (See Comments)   Abdominal pain   Doxycycline Other (See Comments)   Stomach cramps   Amoxicillin-pot Clavulanate Hives   Has patient had a PCN  reaction causing immediate rash, facial/tongue/throat swelling, SOB or lightheadedness with hypotension: No Has patient had a PCN reaction causing severe rash involving mucus membranes or skin necrosis: No Has patient had a PCN reaction that required hospitalization: No Has patient had a PCN reaction occurring within the last 10 years: No If all of the above answers are "NO", then may proceed with Cephalosporin use.   Keppra [levetiracetam] Other (See Comments)   Aggressive behavior       Medication List       Accurate as of May 26, 2018  9:01 AM. Always use your most recent med list.        acetaminophen 325 MG tablet Commonly known as:  TYLENOL Take 2 tablets (650 mg total) by mouth every 6 (six) hours as needed for mild pain or moderate pain.   albuterol (2.5 MG/3ML) 0.083% nebulizer solution Commonly known as:  PROVENTIL INHALE CONTENTS OF 1 VIAL IN NEBULIZER EVERY 4 TO 6 HOURS IF NEEDED FOR COUGH OR WHEEZING   amoxicillin-clavulanate 875-125 MG tablet Commonly known as:  AUGMENTIN Take 1 tablet by mouth every 12 (twelve) hours.   azithromycin 250 MG tablet Commonly known as:  ZITHROMAX Take 1 tablet (250 mg total) by mouth daily for 4 days.   benzonatate 100 MG capsule Commonly known as:  TESSALON Take 2 capsules (200 mg total) by mouth 2 (two) times daily as needed for cough.   budesonide 1 MG/2ML nebulizer solution Commonly known as:  PULMICORT Take 2 mLs (1 mg total) by nebulization 4 (four) times daily. During upper respiratory infections and asthma flares for 1-2 weeks.   cetirizine 10 MG tablet Commonly known as:  ZYRTEC Take 1 tablet (10 mg total) by mouth daily.   cloNIDine 0.1 MG tablet Commonly known as:  CATAPRES TAKE 1 AND 1/2 TABLET BY MOUTH AT BEDTIME   Depakote 500 MG DR tablet Generic drug:  divalproex TAKE 1 TABLET BY MOUTH IN THE MORNING AND 2 AT NIGHT   famotidine 20 MG tablet Commonly known as:  Pepcid Take 1 tablet (20 mg total) by  mouth every 12 (twelve) hours. Pharmacy-please d/c rx for ranitidine   formoterol 20 MCG/2ML nebulizer solution Commonly known as:  Perforomist Take 2 mLs (20 mcg total) by nebulization 2 (two) times daily.   Levonorgestrel-Ethinyl Estradiol 0.15-0.03 &0.01 MG tablet  Commonly known as:  Seasonique Take 1 tablet by mouth daily.   Melatonin 3 MG Tabs Take 3 mg by mouth at bedtime as needed (for sleep).   mometasone 50 MCG/ACT nasal spray Commonly known as:  NASONEX Place 2 sprays into the nose daily.   montelukast 10 MG tablet Commonly known as:  SINGULAIR Take 1 tablet (10 mg total) by mouth at bedtime.   ondansetron 4 MG disintegrating tablet Commonly known as:  ZOFRAN-ODT DISSOLVE 1 TABLET(4 MG) ON THE TONGUE EVERY 8 HOURS AS NEEDED FOR NAUSEA OR VOMITING   polyethylene glycol powder powder Commonly known as:  GLYCOLAX/MIRALAX Dissolve 17 grasm in at least 8 ounces water/juice and drink by mouth twice daily   predniSONE 20 MG tablet Commonly known as:  DELTASONE 3 tabs by mouth daily x 3 days, then 2 tabs by mouth daily x 2 days then 1 tab by mouth daily x 2 days   Revefenacin 175 MCG/3ML Soln Commonly known as:  Yupelri Inhale 1 vial into the lungs daily.   senna 8.6 MG tablet Commonly known as:  SENOKOT Take 1 tablet by mouth at bedtime.   terconazole 0.8 % vaginal cream Commonly known as:  TERAZOL 3 USE TOPICALLY IF NEEDED FOR YEAST   traZODone 50 MG tablet Commonly known as:  DESYREL Take 2 tablets (100 mg total) by mouth at bedtime.        Total time spent with the patient was 30 minutes, of which 50% or more was spent in counseling and coordination of care.  Elveria Rising NP-C [facility] Ph. [phone] Fax [phone]

## 2018-05-26 NOTE — Telephone Encounter (Signed)
Post ED Visit - Positive Culture Follow-up  Culture report reviewed by antimicrobial stewardship pharmacist: [facility] []  Enzo Bi, Pharm.D. []  Celedonio Miyamoto, Pharm.D., BCPS AQ-ID []  Garvin Fila, Pharm.D., BCPS []  Georgina Pillion, Pharm.D., BCPS []  [place], [place].D., BCPS, AAHIVP []  Estella Husk, Pharm.D., BCPS, AAHIVP []  Lysle Pearl, PharmD, BCPS []  Phillips Climes, PharmD, BCPS []  Agapito Games, PharmD, BCPS []  Verlan Friends, PharmD []  Mervyn Gay, PharmD, BCPS []  Vinnie Level, PharmD  [facility] []  Len Childs, PharmD []  Greer Pickerel, PharmD []  Adalberto Cole, PharmD []  Perlie Gold, Rph []  Lonell Face) Jean Rosenthal, PharmD []  Earl Many, PharmD []  Junita Push, PharmD []  Dorna Leitz, PharmD []  Terrilee Files, PharmD []  Lynann Beaver, PharmD []  Keturah Barre, PharmD []  Loralee Pacas, PharmD [x]  Bernadene Person, PharmD   Positive urine culture and no further patient follow-up is required at this time.  Jerry Caras 05/26/2018, 12:41 PM

## 2018-05-27 ENCOUNTER — Encounter (INDEPENDENT_AMBULATORY_CARE_PROVIDER_SITE_OTHER): Payer: Self-pay | Admitting: Family

## 2018-05-27 DIAGNOSIS — G809 Cerebral palsy, unspecified: Secondary | ICD-10-CM | POA: Diagnosis not present

## 2018-05-27 DIAGNOSIS — J4520 Mild intermittent asthma, uncomplicated: Secondary | ICD-10-CM | POA: Diagnosis not present

## 2018-05-27 NOTE — Patient Instructions (Signed)
Thank you for coming in today.   Instructions for you until your next appointment are as follows: 1. Use a wheelchair when you take Lori Miles away from your home. This will help to keep her safe and prevent injury.  2. I have given you a lab order that will look at her muscle strength. You can get this done at any time - she does not need to be fasting. I will call you when I receive the results.  3. Continue physical therapy.  4. Please plan to return for follow up in 2 months or sooner if needed.

## 2018-05-28 ENCOUNTER — Encounter: Payer: Self-pay | Admitting: Adult Health

## 2018-05-28 ENCOUNTER — Telehealth: Payer: Self-pay | Admitting: Adult Health

## 2018-05-28 ENCOUNTER — Other Ambulatory Visit: Payer: Self-pay

## 2018-05-28 ENCOUNTER — Telehealth: Payer: Self-pay

## 2018-05-28 ENCOUNTER — Ambulatory Visit (INDEPENDENT_AMBULATORY_CARE_PROVIDER_SITE_OTHER): Payer: Medicare Other | Admitting: Adult Health

## 2018-05-28 DIAGNOSIS — J4540 Moderate persistent asthma, uncomplicated: Secondary | ICD-10-CM | POA: Diagnosis not present

## 2018-05-28 DIAGNOSIS — J310 Chronic rhinitis: Secondary | ICD-10-CM

## 2018-05-28 NOTE — Assessment & Plan Note (Addendum)
Recent asthmatic bronchitic exacerbation with sinusitis and acute bronchitis.  Now resolving.  No further antibiotics or steroids needed at this time.  Patient is continue on her preventative regimen.  Plan  Patient Instructions  Continue current regimen Follow-up with Dr. Craige Cotta  in 2 months and As needed.

## 2018-05-28 NOTE — Telephone Encounter (Signed)
Copied from CRM [id number]. Topic: General - Other >> May 28, 2018 12:54 PM Trula Slade wrote: Reason for CRM:  Patient would like for Dr. Frutoso Chase medical assistant to give her a call back about her symptoms.  Per dr Okey Dupre, patient needs to be seen in office visit to discuss symptoms---routing to [place]/sched, can you please schedule first availability with dr Okey Dupre, thanks

## 2018-05-28 NOTE — Telephone Encounter (Signed)
[name] called this afternoon stating that pt is having severe diarrhea and she would like for something to be called in regarding this concern.  Asked patient if she has contacted her PCP Hillard Danker MD. [name] states that she tried calling PCP since last week and no return call. Advised her to try to call there office again today. She agreed that she will call back today. Pt verbalized understanding and denied any questions or concerns at this time.  Nothing further needed.

## 2018-05-28 NOTE — Progress Notes (Signed)
  ID: Lori Miles, female    DOB: 1995/07/26, 23 y.o.   MRN: [id number]  Chief Complaint  Patient presents with   Follow-up    Bronchiits     Referring provider: Myrlene Broker, *  HPI: 23 year old female never smoker that has a complicated medical history that includes cerebral palsy, mental insufficiency with severe behavioral issues and autism., She has a seizure disorder and underlying asthma and allergic rhinitis has been seen by multiple medical providers in our office and is followed by Dr. Lucie Leather with asthma and allergy. Sleep evaluation February 2019 for sleep disturbances suspicious for sleep apnea.  However has had 3 negative sleep studies for sleep apnea, see below . Sleep study positive for primary snoring   TEST /Events :  Sleep study January 06, 2015 at [place] sleep study at [facility] AHI 0.4 (total sleep time 428 minutes) moderate to loud snoring Sleep study March 27, 2016 AHI 0.1 SaO2 low 90%, positive snoring Sleep study December 10, 2017 AHI 0.3, SaO2 low 93% (sleep time 413 minutes) positive snoring  A CPAP titration study was ordered June 2018 with known negative diagnostic sleep study with elimination of snoring on CPAP 10 cm H2O total sleep time 367 minutes with sleep efficiency 98%  05/28/2018 Follow up : Bronchitis  Patient presents for a follow-up visit.  Patient was recently seen in the emergency room for a slow to resolve bronchitis.  She had been treated for an acute bronchitis and sinusitis with antibiotics and steroid taper.  Patient was continued to have cough and congestion.  In the emergency room chest x-ray showed no acute process.  She was given a Z-Pak.  Patient is accompanied by her mother.  She says that she is starting to feel better cough has totally resolved.  She has some lingering nasal congestion and postnasal drainage.  Denies any fever, chest pain orthopnea or edema.  Appetite is improved.  No  nausea vomiting or diarrhea.  Patient is followed by asthma and allergy for moderate persistent asthma.  She is on budesonide nebulizer, Perforomist nebulizer twice daily and you calorie daily.  She takes Singulair and Zyrtec daily.  Along with Nasonex daily.   Allergies  Allergen Reactions   Abilify [Aripiprazole] Swelling and Palpitations   Seroquel [Quetiapine Fumarate] Palpitations   Bactrim [Sulfamethoxazole-Trimethoprim] Other (See Comments)    Abdominal pain   Doxycycline Other (See Comments)    Stomach cramps   Amoxicillin-Pot Clavulanate Hives    Has patient had a PCN reaction causing immediate rash, facial/tongue/throat swelling, SOB or lightheadedness with hypotension: No Has patient had a PCN reaction causing severe rash involving mucus membranes or skin necrosis: No Has patient had a PCN reaction that required hospitalization: No Has patient had a PCN reaction occurring within the last 10 years: No If all of the above answers are "NO", then may proceed with Cephalosporin use.    Keppra [Levetiracetam] Other (See Comments)    Aggressive behavior     Immunization History  Administered Date(s) Administered   Pneumococcal Polysaccharide-23 08/06/2016   Tdap 03/03/2017    Past Medical History:  Diagnosis Date   Asthma    Autism    mom states no actual dx, but admits she has some symptoms   Bacteriuria    Chronic constipation    Cognitive developmental delay    Disruptive behavior disorder    Family history of adverse reaction to anesthesia    mother had itching after anesthesia one time  Frequency-urgency syndrome    Gait disorder    ORGANIC PER NERUOLGIST NOTE (DR HICKLING)   Generalized convulsive epilepsy (HCC) NEUROLOGIST-  DR HICKLING   GERD (gastroesophageal reflux disease)    Gout    H/O sinus tachycardia    History of acute respiratory failure 02/14/2015   SECONDARY TO CAP   History of recurrent UTIs    Interstitial cystitis     Moderate intellectual disability    OSA (obstructive sleep apnea)    no machine yet    Partial epilepsy with impairment of consciousness (HCC)    FOLLOWED BY DR HICKLING   Pneumonia 2018 last time   PONV (postoperative nausea and vomiting)    Seizure (HCC)    most recent sz " at the end of summer"-last sz years ago per mother   Urinary incontinence     Tobacco History: Social History   Tobacco Use  Smoking Status Never Smoker  Smokeless Tobacco Never Used   Counseling given: Not Answered   Outpatient Medications Prior to Visit  Medication Sig Dispense Refill   acetaminophen (TYLENOL) 325 MG tablet Take 2 tablets (650 mg total) by mouth every 6 (six) hours as needed for mild pain or moderate pain. 60 tablet 0   albuterol (PROVENTIL) (2.5 MG/3ML) 0.083% nebulizer solution INHALE CONTENTS OF 1 VIAL IN NEBULIZER EVERY 4 TO 6 HOURS IF NEEDED FOR COUGH OR WHEEZING 150 mL 5   benzonatate (TESSALON) 100 MG capsule Take 2 capsules (200 mg total) by mouth 2 (two) times daily as needed for cough. 20 capsule 0   budesonide (PULMICORT) 1 MG/2ML nebulizer solution Take 2 mLs (1 mg total) by nebulization 4 (four) times daily. During upper respiratory infections and asthma flares for 1-2 weeks. 120 mL 2   cetirizine (ZYRTEC) 10 MG tablet Take 1 tablet (10 mg total) by mouth daily. 30 tablet 3   cloNIDine (CATAPRES) 0.1 MG tablet TAKE 1 AND 1/2 TABLET BY MOUTH AT BEDTIME (Patient taking differently: Take 0.15 mg by mouth at bedtime. ) 45 tablet 5   DEPAKOTE 500 MG DR tablet TAKE 1 TABLET BY MOUTH IN THE MORNING AND 2 AT NIGHT (Patient taking differently: Take 500-1,000 mg by mouth as directed. TAKE 1 (500 MG) TABLET BY MOUTH IN THE MORNING AND 2 (1000 MG) tablet AT NIGHT) 90 tablet 5   famotidine (PEPCID) 20 MG tablet Take 1 tablet (20 mg total) by mouth every 12 (twelve) hours. Pharmacy-please d/c rx for ranitidine 60 tablet 3   formoterol (PERFOROMIST) 20 MCG/2ML nebulizer  solution Take 2 mLs (20 mcg total) by nebulization 2 (two) times daily. 120 mL 2   Levonorgestrel-Ethinyl Estradiol (SEASONIQUE) 0.15-0.03 &0.01 MG tablet Take 1 tablet by mouth daily. 3 Package 3   Melatonin 3 MG TABS Take 3 mg by mouth at bedtime as needed (for sleep).      mometasone (NASONEX) 50 MCG/ACT nasal spray Place 2 sprays into the nose daily. 17 g 5   montelukast (SINGULAIR) 10 MG tablet Take 1 tablet (10 mg total) by mouth at bedtime. 30 tablet 3   ondansetron (ZOFRAN-ODT) 4 MG disintegrating tablet DISSOLVE 1 TABLET(4 MG) ON THE TONGUE EVERY 8 HOURS AS NEEDED FOR NAUSEA OR VOMITING 60 tablet 2   polyethylene glycol powder (GLYCOLAX/MIRALAX) powder Dissolve 17 grasm in at least 8 ounces water/juice and drink by mouth twice daily (Patient taking differently: Take 17 g by mouth daily as needed for mild constipation. ) 1020 g 3   Revefenacin (YUPELRI) 175 MCG/3ML  SOLN Inhale 1 vial into the lungs daily. 270 mL 2   senna (SENOKOT) 8.6 MG tablet Take 1 tablet by mouth at bedtime.      terconazole (TERAZOL 3) 0.8 % vaginal cream USE TOPICALLY IF NEEDED FOR YEAST     traZODone (DESYREL) 50 MG tablet Take 2 tablets (100 mg total) by mouth at bedtime. 62 tablet 5   amoxicillin-clavulanate (AUGMENTIN) 875-125 MG tablet Take 1 tablet by mouth every 12 (twelve) hours. 14 tablet 0   predniSONE (DELTASONE) 20 MG tablet 3 tabs by mouth daily x 3 days, then 2 tabs by mouth daily x 2 days then 1 tab by mouth daily x 2 days 15 tablet 0   No facility-administered medications prior to visit.      Review of Systems:   Constitutional:   No  weight loss, night sweats,  Fevers, chills, fatigue, or  lassitude.  HEENT:   No headaches,  Difficulty swallowing,  Tooth/dental problems, or  Sore throat,                No sneezing, itching, ear ache,  +nasal congestion, post nasal drip,   CV:  No chest pain,  Orthopnea, PND, swelling in lower extremities, anasarca, dizziness, palpitations,  syncope.   GI  No heartburn, indigestion, abdominal pain, nausea, vomiting, diarrhea, change in bowel habits, loss of appetite, bloody stools.   Resp:   No chest wall deformity  Skin: no rash or lesions.  GU: no dysuria, change in color of urine, no urgency or frequency.  No flank pain, no hematuria   MS:  No joint pain or swelling.  No decreased range of motion.  No back pain.    Physical Exam  BP 104/70 (BP Location: Left Arm, Cuff Size: Normal)    Pulse 77    Temp 98.3 F (36.8 C) (Oral)    Ht 4' 10.5" (1.486 m)    Wt 185 lb (83.9 kg)    SpO2 96%    BMI 38.01 kg/m   GEN: A/Ox3; pleasant , NAD, obese   HEENT:  [place]/AT,  EACs-clear, TMs-wnl, NOSE-clear drainage, THROAT-clear, no lesions, no postnasal drip or exudate noted.  Class III MP airway  NECK:  Supple w/ fair ROM; no JVD; normal carotid impulses w/o bruits; no thyromegaly or nodules palpated; no lymphadenopathy.    RESP  Clear  P & A; w/o, wheezes/ rales/ or rhonchi. no accessory muscle use, no dullness to percussion  CARD:  RRR, no m/r/g, no peripheral edema, pulses intact, no cyanosis or clubbing.  GI:   Soft & nt; nml bowel sounds; no organomegaly or masses detected.   Musco: Warm bil, no deformities or joint swelling noted.   Neuro: alert, no focal deficits noted.    Skin: Warm, no lesions or rashes    Lab Results:  CBC    Component Value Date/Time   WBC 9.9 05/23/2018 2139   RBC 4.14 05/23/2018 2139   HGB 12.4 05/23/2018 2139   HCT 40.4 05/23/2018 2139   PLT 262 05/23/2018 2139   MCV 97.6 05/23/2018 2139   MCH 30.0 05/23/2018 2139   MCHC 30.7 05/23/2018 2139   RDW 14.6 05/23/2018 2139   LYMPHSABS 1.6 05/23/2018 2139   MONOABS 1.5 (H) 05/23/2018 2139   EOSABS 0.0 05/23/2018 2139   BASOSABS 0.0 05/23/2018 2139    BMET    Component Value Date/Time   NA 139 05/23/2018 2139   K 4.1 05/23/2018 2139   CL 107 05/23/2018 2139  CO2 25 05/23/2018 2139   GLUCOSE 124 (H) 05/23/2018 2139   BUN 20  05/23/2018 2139   CREATININE 0.89 05/23/2018 2139   CALCIUM 8.6 (L) 05/23/2018 2139   GFRNONAA >60 05/23/2018 2139   GFRAA >60 05/23/2018 2139    BNP    Component Value Date/Time   BNP 16.1 10/24/2014 0745    ProBNP    Component Value Date/Time   PROBNP 9.0 05/05/2015 1604    Imaging: Dg Chest 2 View  Result Date: 05/23/2018 CLINICAL DATA:  Cough and UTI.  Patient on antibiotics. EXAM: CHEST - 2 VIEW COMPARISON:  April 28, 2018 FINDINGS: The study is limited due to low volumes. The cardiomediastinal silhouette is stable. No pneumothorax. Increased interstitial markings centrally. No other abnormalities. IMPRESSION: Increased lung markings centrally could be due to the low lung volumes, pulmonary venous congestion, or an atypical infection. Electronically Signed   By: Gerome Sam III M.D   On: 05/23/2018 22:25   Dg Chest 2 View  Result Date: 04/28/2018 CLINICAL DATA:  Initial evaluation for acute fever. EXAM: CHEST - 2 VIEW COMPARISON:  Prior radiograph from 03/14/2018 FINDINGS: Cardiac and mediastinal silhouettes within normal limits. Lungs normally inflated. No consolidative airspace disease to suggest pneumonia. No edema or effusion. No pneumothorax. No acute osseous finding. IMPRESSION: No active cardiopulmonary disease. Electronically Signed   By: Rise Mu M.D.   On: 04/28/2018 22:00      No flowsheet data found.  No results found for: NITRICOXIDE      Assessment & Plan:   Asthma Recent asthmatic bronchitic exacerbation with sinusitis and acute bronchitis.  Now resolving.  No further antibiotics or steroids needed at this time.  Patient is continue on her preventative regimen.  Plan  Patient Instructions  Continue current regimen Follow-up with Dr. Craige Cotta  in 2 months and As needed.     Chronic rhinitis Cont on preventive regimen       Rubye Oaks, NP 05/28/2018

## 2018-05-28 NOTE — Assessment & Plan Note (Signed)
Cont on preventive regimen

## 2018-05-28 NOTE — Patient Instructions (Addendum)
Continue current regimen Follow-up with Dr. Craige Cotta  in 2 months and As needed.

## 2018-05-29 NOTE — Telephone Encounter (Signed)
Avoid juice soda, milk. Try bananas, rice, toast diet to help.

## 2018-05-29 NOTE — Telephone Encounter (Signed)
Called patient's mother. She said that she is doing better but is having pretty bad diarrhea. They have tried imodium and it does not seem to help. Is there anything else she can try? Patient is scheduled to be seen on Monday morning because she would like to wait to see how she does over the weekend.

## 2018-05-29 NOTE — Telephone Encounter (Signed)
Routing to dr crawford, please advise, thanks 

## 2018-06-01 ENCOUNTER — Telehealth: Payer: Self-pay

## 2018-06-01 ENCOUNTER — Ambulatory Visit (INDEPENDENT_AMBULATORY_CARE_PROVIDER_SITE_OTHER): Payer: Medicare Other | Admitting: Internal Medicine

## 2018-06-01 ENCOUNTER — Ambulatory Visit: Payer: Medicare Other | Admitting: Internal Medicine

## 2018-06-01 ENCOUNTER — Encounter: Payer: Self-pay | Admitting: Internal Medicine

## 2018-06-01 ENCOUNTER — Other Ambulatory Visit: Payer: Self-pay

## 2018-06-01 ENCOUNTER — Telehealth: Payer: Self-pay | Admitting: Pulmonary Disease

## 2018-06-01 VITALS — BP 110/70 | HR 106 | Temp 98.3°F | Ht 58.5 in | Wt 190.0 lb

## 2018-06-01 DIAGNOSIS — R399 Unspecified symptoms and signs involving the genitourinary system: Secondary | ICD-10-CM | POA: Diagnosis not present

## 2018-06-01 DIAGNOSIS — J310 Chronic rhinitis: Secondary | ICD-10-CM | POA: Diagnosis not present

## 2018-06-01 DIAGNOSIS — E099 Drug or chemical induced diabetes mellitus without complications: Secondary | ICD-10-CM | POA: Diagnosis not present

## 2018-06-01 DIAGNOSIS — R81 Glycosuria: Secondary | ICD-10-CM | POA: Diagnosis not present

## 2018-06-01 DIAGNOSIS — J4540 Moderate persistent asthma, uncomplicated: Secondary | ICD-10-CM

## 2018-06-01 DIAGNOSIS — T380X5A Adverse effect of glucocorticoids and synthetic analogues, initial encounter: Secondary | ICD-10-CM

## 2018-06-01 LAB — POCT URINALYSIS DIPSTICK
Bilirubin, UA: NEGATIVE
Blood, UA: NEGATIVE
Glucose, UA: NEGATIVE
Leukocytes, UA: NEGATIVE
NITRITE UA: NEGATIVE
PH UA: 6 (ref 5.0–8.0)
Protein, UA: POSITIVE — AB
Spec Grav, UA: >=1.03 — AB (ref 1.010–1.025)
UROBILINOGEN UA: 0.2 U/dL

## 2018-06-01 LAB — POCT GLYCOSYLATED HEMOGLOBIN (HGB A1C): Hemoglobin A1C: 6.5 % — AB (ref 4.0–5.6)

## 2018-06-01 NOTE — Telephone Encounter (Signed)
Copied from CRM [id number]. Topic: General - Other >> Jun 01, 2018  2:25 PM Jaquita Rector A wrote: Reason for CRM: Patient mother request a call back to discuss patients illness. Request a call back at Ph# [phone]

## 2018-06-01 NOTE — Assessment & Plan Note (Addendum)
HgA1c 6.5 today in light of chronic prednisone courses for her sinuses. Mom is very persistent in requesting prednisone courses from many healthcare providers. 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 I have tried again today to remind her that not all sinus symptoms need prednisone but mom is not very receptive to this.

## 2018-06-01 NOTE — Assessment & Plan Note (Signed)
Concerning given high sugar blood levels in several recent ER visits as well as many courses of steroids in the recent past due to persistent requests from this from mom despite lackluster symptoms in patient. 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 Mom is in denial and states that her daughter could not have diabetes.

## 2018-06-01 NOTE — Progress Notes (Signed)
   Subjective:   Patient ID: Lori Miles, female    DOB: 1995/06/13, 23 y.o.   MRN: [id number]  HPI The patient is a 23 YO female coming in for several concerns including sinus congestion (mom is concerned that her face is still mildly swollen and some difficulty with more congestion at night time, chronic snoring, denies fevers or chills, recently treated with at least 2 courses of antibiotics and steroids, concurrent asthma and nebulizer medications, mom does not do nasal treatment with her, ) and asthma () and urine problems (mom is concerned about infection as she states that she always gets one, no current symptoms, several u/a with significant glucosuria in ER, many recent courses of steroids recently, denies nausea or vomiting recently). Patient does not speak for herself and mom voices all symptoms and concerns. Patient states that she is not having any problems but mom states she does not know.   Review of Systems  Constitutional: Negative.  Negative for fatigue, fever and unexpected weight change.  HENT: Positive for congestion, postnasal drip, rhinorrhea and sinus pressure. Negative for ear discharge, ear pain, sinus pain, sneezing, sore throat, tinnitus, trouble swallowing and voice change.   Eyes: Negative.   Respiratory: Negative for cough, chest tightness, shortness of breath and wheezing.   Cardiovascular: Negative.  Negative for chest pain, palpitations and leg swelling.  Gastrointestinal: Negative.  Negative for abdominal distention, abdominal pain, constipation, diarrhea, nausea and vomiting.  Musculoskeletal: Negative.   Skin: Negative.   Neurological: Negative.   Psychiatric/Behavioral: Negative.     Objective:  Physical Exam Constitutional:      Appearance: She is well-developed. She is obese.  HENT:     Head: Normocephalic and atraumatic.     Comments: Oropharynx with redness and clear drainage, nose with swollen turbinates, TMs normal bilaterally.  Neck:   Musculoskeletal: Normal range of motion.     Thyroid: No thyromegaly.  Cardiovascular:     Rate and Rhythm: Normal rate and regular rhythm.  Pulmonary:     Effort: Pulmonary effort is normal. No respiratory distress.     Breath sounds: Normal breath sounds. No wheezing or rales.  Abdominal:     Palpations: Abdomen is soft.  Musculoskeletal:        General: Tenderness present.  Lymphadenopathy:     Cervical: No cervical adenopathy.  Skin:    General: Skin is warm and dry.  Neurological:     Mental Status: She is alert and oriented to person, place, and time.     Vitals:   06/01/18 1015  BP: 110/70  Pulse: (!) 106  Temp: 98.3 F (36.8 C)  TempSrc: Oral  SpO2: 95%  Weight: 190 lb (86.2 kg)  Height: 4' 10.5" (1.486 m)    Assessment & Plan:

## 2018-06-01 NOTE — Patient Instructions (Signed)
We are checking you for diabetes today and will call back about the results.   I do not think she needs steroids today and the lungs are clear which is great news.   The urine does not show any signs of infection.

## 2018-06-01 NOTE — Telephone Encounter (Signed)
Called and spoke with patient's mother Lori Miles regarding pt not feeling well [name] advised that patient is still not sleeping well, falls asleep so deep that pt wakes up gasping at times [name] advised if VS can order another inlab or HST for patient.  Also patient's mother states that pt's face is swollen around the eyes and nose. She advised that pt was not well, had coughing and not acting right today. Asked pt's mother if pt had sinus pressure-she was not sure, pt still not feeling well, still has diarrhea Advised patient that if she feels the face is swollen she will need to take her to ER or urgent care She advised that she took pt to PCP today and nothing was done.  Routing message to VS to review, and advise.  VS please advise. Thank you.

## 2018-06-01 NOTE — Assessment & Plan Note (Signed)
No notable signs for infection today. No antibiotics or steroids given. She is asked to keep taking zyrtec daily. Again advised to consider nasal medication to help but mom does not want to do this. They will likely seek alternate opinion about this as mom was not happy with decision to not give prednisone. 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 I would ask that other health care providers refrain from steroid courses unless there are real indications for both antibiotics and steroids for the health of the patient.

## 2018-06-01 NOTE — Assessment & Plan Note (Signed)
Mom is concerned but no wheezing on exam and last CXR reviewed and not convincing for pneumonia despite mom's perception that this was pneumonia. She is under care of allergy and asthma and will continue on her chronic nebulization medication currently. No indication for either antibiotics or steroids today. However given HgA1c 6.5 today I would highly caution others involved in her care from giving unnecessary steroid treatment as her mother does request steroids and antibiotics with regularity and often in situations where there are not adequate indications for such treatment.

## 2018-06-01 NOTE — Telephone Encounter (Signed)
Patient was seen in clinic today, and I have already given lab results to pt's mother. If there are further questions or concerns, please advise pt's mother to provide this information specifically so I can discuss with Dr. Okey Dupre before making repeated, unnecessary call backs.

## 2018-06-02 ENCOUNTER — Ambulatory Visit: Payer: Medicare Other | Admitting: Physical Therapy

## 2018-06-02 DIAGNOSIS — R2689 Other abnormalities of gait and mobility: Secondary | ICD-10-CM

## 2018-06-02 DIAGNOSIS — R2681 Unsteadiness on feet: Secondary | ICD-10-CM

## 2018-06-02 DIAGNOSIS — J449 Chronic obstructive pulmonary disease, unspecified: Secondary | ICD-10-CM | POA: Diagnosis not present

## 2018-06-02 DIAGNOSIS — M6281 Muscle weakness (generalized): Secondary | ICD-10-CM | POA: Diagnosis not present

## 2018-06-02 NOTE — Telephone Encounter (Signed)
She would need face to face visit prior to ordering another sleep study.  Can be with me or NP.    She would need in lab NPSG followed by [facility].  Current symptoms sound like allergies.    She should continue using zyrtec, singulair, and nasonex.  If her symptoms progress, then she should f/u again with her PCP.

## 2018-06-02 NOTE — Telephone Encounter (Signed)
Called and spoke with patient regarding VS recommendations.  Scheduled appt for sleep study for Friday 07/24/2018 at 1030am Informed the patient of results and recommendations today. Pt verbalized understanding and denied any questions or concerns at this time.  Nothing further needed.

## 2018-06-03 ENCOUNTER — Encounter: Payer: Self-pay | Admitting: Physical Therapy

## 2018-06-03 NOTE — Therapy (Signed)
[facility] [address] [place], [place], [address] Phone: [phone]   Fax:  [phone]  Physical Therapy Treatment  Patient Details  Name: Lori Miles MRN: [id number] Date of Birth: [birth year] Referring Provider (PT): Rockwell Germany, NP   Encounter Date: 06/02/2018  PT End of Session - 06/03/18 1841    Visit Number  6    Number of Visits  9    Date for PT Re-Evaluation  06/16/18    Authorization Type  [employer] Medicare    Authorization Time Period  04-14-18 - 07-11-18    PT Start Time  0802    PT Stop Time  0847    PT Time Calculation (min)  45 min    Activity Tolerance  Patient tolerated treatment well    Behavior During Therapy  [facility] for tasks assessed/performed       Past Medical History:  Diagnosis Date  . Asthma   . Autism    mom states no actual dx, but admits she has some symptoms  . Bacteriuria   . Chronic constipation   . Cognitive developmental delay   . Disruptive behavior disorder   . Family history of adverse reaction to anesthesia    mother had itching after anesthesia one time  . Frequency-urgency syndrome   . Gait disorder    ORGANIC PER NERUOLGIST NOTE (DR HICKLING)  . Generalized convulsive epilepsy ([place]) NEUROLOGIST-  DR HICKLING  . GERD (gastroesophageal reflux disease)   . Gout   . H/O sinus tachycardia   . History of acute respiratory failure 02/14/2015   SECONDARY TO CAP  . History of recurrent UTIs   . Interstitial cystitis   . Moderate intellectual disability   . OSA (obstructive sleep apnea)    no machine yet   . Partial epilepsy with impairment of consciousness ([place])    FOLLOWED BY DR HICKLING  . Pneumonia 2018 last time  . PONV (postoperative nausea and vomiting)   . Seizure ([place])    most recent sz " at the end of summer"-last sz years ago per mother  . Urinary incontinence     Past Surgical History:  Procedure Laterality Date  . CYSTOSCOPY N/A 06/03/2017   Procedure: CYSTOSCOPY WITH EXAM UNDER ANESTHESIA;  Surgeon: Festus Aloe, MD;  Location: [facility];  Service: Urology;  Laterality: N/A;  . CYSTOSCOPY WITH HYDRODISTENSION AND BIOPSY  04/14/2012   Procedure: CYSTOSCOPY/BIOPSY/HYDRODISTENSION;  Surgeon: Hanley Ben, MD;  Location: [facility];  Service: Urology;;  instillation of marcaine and pyridium  . EUA/  PAP SMEAR/  BREAST EXAM  03-12-2016   dr Ihor Dow [facility]  . EUA/ VAGINOSCOPY/ COLPOSCOPY FO THE HYMENAL RING  10-04-2009    dr Delsa Sale  [facility]  . TONSILLECTOMY      There were no vitals filed for this visit.  Subjective Assessment - 06/03/18 1734    Subjective  [name] states she took pt to PCP yesterday with UTI and swollen face due to sinus congestion     Patient is accompained by:  Family member    Pertinent History  Cognitive developmentally delayed:  recurrent falls:  weakness:  tremor, unspecified:  generalized convulsive epilepsy;  autism spectrum disorder    Patient Stated Goals  improve walking and try to stop the tremors    Currently in Pain?  No/denies                       [facility]  PT Treatment/Exercise - 06/03/18 0001      Ambulation/Gait   Ambulation/Gait  Yes    Ambulation/Gait Assistance  4: Min guard    Ambulation Distance (Feet)  250 Feet    Assistive device  1 person hand held assist    Gait Pattern  Step-through pattern   LLE internally rotates with foot flat contact at stance   Ambulation Surface  Level;Indoor    Stairs  Yes    Stairs Assistance  5: Supervision    Stair Management Technique  Two rails;Alternating pattern;Step to pattern   step to pattern with descension   Number of Stairs  4   2 reps = 8 steps total   Height of Stairs  6      Knee/Hip Exercises: Aerobic   Recumbent Bike  SciFit level 2.0 x 5"   pt took approx. 3 rest breaks during 5" total duration on bi     Knee/Hip Exercises: Standing   Hip Flexion   Stengthening;Both;1 set;10 reps;Knee bent;Knee straight   3# weight    Hip Abduction  Stengthening;Both;1 set;10 reps;Knee straight   3# weight   Hip Extension  Stengthening;Both;1 set;10 reps;Knee straight   3# weight    Other Standing Knee Exercises  Pt performed stepping over and back of  black balance beam 5 reps each leg with min hand held assist; pt internally rotated LLE when lifting leg over beam to floor - unable to maintain LLE in neutral position        Pt performed ladder activity on floor to facilitate longer steps on each leg - pt stepped in ladder with cues to avoid  touching rungs of ladder - attempted step over step sequence in ladder  Pt performed zoom ball activity in standing position on floor - approx. 20 reps for closed chain strengthening each leg and for  Improved standing balance            PT Long Term Goals - 05/25/18 1941      PT LONG TERM GOAL #1   Title  Pt. will amb. 500' on flat, even surface without LOB.      Baseline  230' on 05-25-18 - mother states pt's legs are weaker today due to illness (mother states she has pneumonia)    Time  4    Period  Weeks    Status  On-going    Target Date  06/16/18      PT LONG TERM GOAL #2   Title  Pt will perform at least 10" on recumbent bike nonstop for improved endurance/activity tolerance.    Baseline  6 1/2" on 05-25-18    Time  4    Period  Weeks    Status  Partially Met    Target Date  06/16/18      PT LONG TERM GOAL #3   Title  Mother will verbalize understanding of HEP to assist with LE strengthening.      Baseline  met 05-25-18    Status  Achieved            Plan - 06/03/18 1841    Clinical Impression Statement  Pt had one occurrence of LLE shaking (form of tremor) while performing step up exercise; pt stated she wanted to discontinue this exercise so pt amb. back to mat table with SBA; no shaking noted during gait training in clinic; pt continues to internally rotate LLE with stepping over  balance beam  Rehab Potential  Good    PT Frequency  1x / week    PT Duration  4 weeks    PT Treatment/Interventions  ADLs/Self Care Home Management;DME Instruction;Gait training;Stair training;Orthotic Fit/Training;Patient/family education;Neuromuscular re-education;Balance training;Therapeutic exercise;Therapeutic activities    PT Next Visit Plan  cont LE strengthening    Consulted and Agree with Plan of Care  Patient       Patient will benefit from skilled therapeutic intervention in order to improve the following deficits and impairments:  Abnormal gait, Decreased balance, Decreased strength, Decreased activity tolerance, Decreased endurance, Decreased safety awareness, Pain, Decreased coordination  Visit Diagnosis: Other abnormalities of gait and mobility  Unsteadiness on feet  Muscle weakness (generalized)     Problem List Patient Active Problem List   Diagnosis Date Noted  . Glucosuria 06/01/2018  . Steroid-induced diabetes ([place]) 06/01/2018  . Not well controlled moderate persistent asthma 04/29/2018  . Gastroesophageal reflux disease 04/29/2018  . Recurrent infections 04/29/2018  . Dysuria 02/20/2018  . Chronic rhinitis 04/03/2017  . Recurrent falls 03/21/2017  . Weakness 03/21/2017  . Venous insufficiency 03/21/2017  . Excessive daytime sleepiness 03/05/2016  . 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 Cough variant asthma vs UACS/ vcd  03/15/2015  . Autism spectrum disorder 02/14/2015  . Nausea with vomiting 02/14/2015  . Obesity, morbid ([place]) 12/13/2014  . 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 Acanthosis nigricans 11/14/2014  . Toeing-in 08/29/2014  . Generalized convulsive epilepsy ([place]) 09/28/2012  . Partial epilepsy with impairment of consciousness ([place]) 09/28/2012  . 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 Recurrent urinary  tract infection 08/01/2011  . Asthma 08/01/2011  . Chronic constipation 08/01/2011  . Contraception 08/01/2011    Alda Lea, PT 06/03/2018, 6:46 PM  [facility] [address] [place], [place], [address] Phone: [phone]   Fax:  [phone]  Name: Lori Miles MRN: [id number] Date of Birth: 08/27/1995

## 2018-06-04 ENCOUNTER — Telehealth: Payer: Self-pay | Admitting: Internal Medicine

## 2018-06-04 DIAGNOSIS — R319 Hematuria, unspecified: Secondary | ICD-10-CM | POA: Diagnosis not present

## 2018-06-04 DIAGNOSIS — N390 Urinary tract infection, site not specified: Secondary | ICD-10-CM | POA: Diagnosis not present

## 2018-06-04 DIAGNOSIS — R300 Dysuria: Secondary | ICD-10-CM | POA: Diagnosis not present

## 2018-06-04 NOTE — Telephone Encounter (Signed)
Noted  

## 2018-06-04 NOTE — Telephone Encounter (Signed)
fyi

## 2018-06-04 NOTE — Telephone Encounter (Signed)
Copied from CRM [id number]. Topic: General - Other >> Jun 04, 2018 11:38 AM Maia Petties wrote: CRM for notification. See Telephone encounter for: 06/04/18. Pt mother called stating she took daughter to [facility] urgent care and pt was confirmed with a UTI. She said that she told the office she had a UTI. The worker gave pt pickles the other day and she can't have them. She said now the pt will probably end up with a sinus infection and pneumonia. She wants Dr. Okey Dupre to know that anytime the pt is given something she isn't supposed to have that ABX need to be prescribed to prevent a UTI. She said it happens anytime she eats something she shouldn't. Pt is not able to hold her urine, etc.   She said she will probably have to find her another doctor if no one is going to listen to her.

## 2018-06-05 DIAGNOSIS — G809 Cerebral palsy, unspecified: Secondary | ICD-10-CM | POA: Diagnosis not present

## 2018-06-05 DIAGNOSIS — J4520 Mild intermittent asthma, uncomplicated: Secondary | ICD-10-CM | POA: Diagnosis not present

## 2018-06-06 ENCOUNTER — Other Ambulatory Visit: Payer: Self-pay

## 2018-06-06 ENCOUNTER — Emergency Department (HOSPITAL_COMMUNITY): Payer: Medicare Other

## 2018-06-06 ENCOUNTER — Encounter (HOSPITAL_COMMUNITY): Payer: Self-pay

## 2018-06-06 ENCOUNTER — Telehealth: Payer: Self-pay | Admitting: Pulmonary Disease

## 2018-06-06 ENCOUNTER — Emergency Department (HOSPITAL_COMMUNITY)
Admission: EM | Admit: 2018-06-06 | Discharge: 2018-06-07 | Disposition: A | Discharge status: Home / Self Care | Payer: Medicare Other | Attending: Emergency Medicine | Admitting: Emergency Medicine

## 2018-06-06 DIAGNOSIS — R059 Cough, unspecified: Secondary | ICD-10-CM

## 2018-06-06 DIAGNOSIS — Z79899 Other long term (current) drug therapy: Secondary | ICD-10-CM | POA: Diagnosis not present

## 2018-06-06 DIAGNOSIS — F840 Autistic disorder: Secondary | ICD-10-CM | POA: Diagnosis not present

## 2018-06-06 DIAGNOSIS — J189 Pneumonia, unspecified organism: Secondary | ICD-10-CM | POA: Insufficient documentation

## 2018-06-06 DIAGNOSIS — F71 Moderate intellectual disabilities: Secondary | ICD-10-CM | POA: Insufficient documentation

## 2018-06-06 DIAGNOSIS — R0602 Shortness of breath: Secondary | ICD-10-CM | POA: Diagnosis not present

## 2018-06-06 DIAGNOSIS — J45909 Unspecified asthma, uncomplicated: Secondary | ICD-10-CM | POA: Diagnosis not present

## 2018-06-06 DIAGNOSIS — R05 Cough: Secondary | ICD-10-CM | POA: Diagnosis not present

## 2018-06-06 LAB — CBC WITH DIFFERENTIAL/PLATELET
Abs Immature Granulocytes: 0.68 10*3/uL — ABNORMAL HIGH (ref 0.00–0.07)
Basophils Absolute: 0.1 10*3/uL (ref 0.0–0.1)
Basophils Relative: 1 %
EOS ABS: 0 10*3/uL (ref 0.0–0.5)
Eosinophils Relative: 0 %
HCT: 36.9 % (ref 36.0–46.0)
Hemoglobin: 11.7 g/dL — ABNORMAL LOW (ref 12.0–15.0)
Immature Granulocytes: 5 %
LYMPHS ABS: 4.6 10*3/uL — AB (ref 0.7–4.0)
Lymphocytes Relative: 34 %
MCH: 30.5 pg (ref 26.0–34.0)
MCHC: 31.7 g/dL (ref 30.0–36.0)
MCV: 96.1 fL (ref 80.0–100.0)
Monocytes Absolute: 1.3 10*3/uL — ABNORMAL HIGH (ref 0.1–1.0)
Monocytes Relative: 10 %
NRBC: 0 % (ref 0.0–0.2)
Neutro Abs: 6.7 10*3/uL (ref 1.7–7.7)
Neutrophils Relative %: 50 %
Platelets: 172 10*3/uL (ref 150–400)
RBC: 3.84 MIL/uL — ABNORMAL LOW (ref 3.87–5.11)
RDW: 14.3 % (ref 11.5–15.5)
WBC: 13.4 10*3/uL — ABNORMAL HIGH (ref 4.0–10.5)

## 2018-06-06 LAB — BASIC METABOLIC PANEL
Anion gap: 9 (ref 5–15)
BUN: 14 mg/dL (ref 6–20)
CO2: 24 mmol/L (ref 22–32)
Calcium: 8.5 mg/dL — ABNORMAL LOW (ref 8.9–10.3)
Chloride: 105 mmol/L (ref 98–111)
Creatinine, Ser: 0.82 mg/dL (ref 0.44–1.00)
GFR calc Af Amer: >60 mL/min (ref >60)
GFR calc non Af Amer: >60 mL/min (ref >60)
Glucose, Bld: 151 mg/dL — ABNORMAL HIGH (ref 70–99)
Potassium: 4.2 mmol/L (ref 3.5–5.1)
Sodium: 138 mmol/L (ref 135–145)

## 2018-06-06 LAB — GROUP A STREP BY PCR: Group A Strep by PCR: NOT DETECTED

## 2018-06-06 MED ORDER — SODIUM CHLORIDE 0.9 % IV BOLUS
1000.0000 mL | Freq: Once | INTRAVENOUS | Status: AC
  Administered 2018-06-06: 1000 mL via INTRAVENOUS

## 2018-06-06 MED ORDER — ONDANSETRON HCL 4 MG/2ML IJ SOLN
4.0000 mg | Freq: Once | INTRAMUSCULAR | Status: AC
  Administered 2018-06-06: 4 mg via INTRAVENOUS
  Filled 2018-06-06: qty 2

## 2018-06-06 NOTE — Telephone Encounter (Signed)
Called by Yarelly's mother who is concerned that she is very congested and wheezing more than usual. Mom denies fever or productive cough. However, I am not sure that she actually took her temperature. Mom could not tell me the patient's respiratory rate. it is very hard to follow what she is saying. The patient has an extremely complex PMH including seizure disorder, chronic persistent asthma, OSA, recurrent UTI's, GERD, autism and moderate intellectual disability. Given the challenged communication, inability to examine the patient, complicated medical history and recent history of pneumonia. I recommended that the patient come to the Emergency Department for evaluation and possible hospital admission.

## 2018-06-06 NOTE — ED Triage Notes (Signed)
Per patient's mother, patient has been experiencing shortness of breath as well as coughing. Patient has been having a hard time keeping her food and medications down as well. Patient's mother called her lung doctor and they instructed her to bring her to the hospital to be reevaluated as she was recently treated for pneumonia.     Patient is currently being treated for a UTI with Cipro.

## 2018-06-06 NOTE — ED Provider Notes (Signed)
[facility]-EMERGENCY DEPT Provider Note   CSN: [id number] Arrival date & time: 06/06/18  2213    History   Chief Complaint Chief Complaint  Patient presents with  . Shortness of Breath    HPI Lori Miles is a 23 y.o. female.     The history is provided by the patient and medical records.  Shortness of Breath    Level 5 caveat: Developmental delay, autism 23 y.o. F with hx of asthma, developmental delay, disruptive behavior disorder, gait disorder, epilepsy, GERD, reported autism, history of frequent UTIs, presenting to the ED with mother for concern of pneumonia.  Patient is not really able to provide any reliable history, majority of information is obtained from mother.  Reportedly she was treated for pneumonia 2 weeks ago with course of antibiotics and steroids.  She seemed to get better but tonight mother has noticed some labored breathing and felt like she was wheezing.  She gave her some albuterol to try to break this up but did not have any noted improvement.  She apparently called patient's pulmonologist recommended to come to the ER for evaluation of possible recurrent pneumonia.  Patient has not had any fevers.  Has also had sore throat and a few episodes of vomiting today.  No reported diarrhea.  She is not had any sick contacts.  She has history of sleep apnea, however has been fighting with insurance to get her mask because she is apparently "just below the threshold" of requiring mask per their standards.  She is currently on ciprofloxacin for UTI, will finish this on Monday.  Past Medical History:  Diagnosis Date  . Asthma   . Autism    mom states no actual dx, but admits she has some symptoms  . Bacteriuria   . Chronic constipation   . Cognitive developmental delay   . Disruptive behavior disorder   . Family history of adverse reaction to anesthesia    mother had itching after anesthesia one time  . Frequency-urgency syndrome   . Gait  disorder    ORGANIC PER NERUOLGIST NOTE (DR HICKLING)  . 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 GERD (gastroesophageal reflux disease)   . Gout   . H/O sinus tachycardia   . History of acute respiratory failure 02/14/2015   SECONDARY TO CAP  . History of recurrent UTIs   . Interstitial cystitis   . Moderate intellectual disability   . OSA (obstructive sleep apnea)    no machine yet   . Partial epilepsy with impairment of consciousness (HCC)    FOLLOWED BY DR HICKLING  . Pneumonia 2018 last time  . PONV (postoperative nausea and vomiting)   . Seizure (HCC)    most recent sz " at the end of summer"-last sz years ago per mother  . Urinary incontinence     Patient Active Problem List   Diagnosis Date Noted  . Glucosuria 06/01/2018  . 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 Not well controlled moderate persistent asthma 04/29/2018  . Gastroesophageal reflux disease 04/29/2018  . Recurrent infections 04/29/2018  . Dysuria 02/20/2018  . Chronic rhinitis 04/03/2017  . Recurrent falls 03/21/2017  . Weakness 03/21/2017  . Venous insufficiency 03/21/2017  . Excessive daytime sleepiness 03/05/2016  . 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 Cough variant asthma vs UACS/ vcd  03/15/2015  . Autism spectrum disorder 02/14/2015  . Nausea with vomiting 02/14/2015  . Obesity, morbid (HCC) 12/13/2014  . Mental retardation, moderate (I.Q.  35-49) 12/13/2014  . 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 Acanthosis nigricans 11/14/2014  . Toeing-in 08/29/2014  . 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 Recurrent urinary tract infection 08/01/2011  . Asthma 08/01/2011  . Chronic constipation 08/01/2011  . Contraception 08/01/2011    Past Surgical History:  Procedure Laterality Date  .  CYSTOSCOPY N/A 06/03/2017   Procedure: CYSTOSCOPY WITH EXAM UNDER ANESTHESIA;  Surgeon: Jerilee Field, MD;  Location: [facility];  Service: Urology;  Laterality: N/A;  . CYSTOSCOPY WITH HYDRODISTENSION AND BIOPSY  04/14/2012   Procedure: CYSTOSCOPY/BIOPSY/HYDRODISTENSION;  Surgeon: Lindaann Slough, MD;  Location: [facility];  Service: Urology;;  instillation of marcaine and pyridium  . EUA/  PAP SMEAR/  BREAST EXAM  03-12-2016   dr Erin Fulling [facility]  . EUA/ VAGINOSCOPY/ COLPOSCOPY FO THE HYMENAL RING  10-04-2009    dr Tamela Oddi  [facility]  . TONSILLECTOMY       OB History   No obstetric history on file.      Home Medications    Prior to Admission medications   Medication Sig Start Date End Date Taking? Authorizing Provider  acetaminophen (TYLENOL) 325 MG tablet Take 2 tablets (650 mg total) by mouth every 6 (six) hours as needed for mild pain or moderate pain. 02/27/17   Everlene Farrier, PA-C  albuterol (PROVENTIL) (2.5 MG/3ML) 0.083% nebulizer solution INHALE CONTENTS OF 1 VIAL IN NEBULIZER EVERY 4 TO 6 HOURS IF NEEDED FOR COUGH OR WHEEZING 03/17/18   Kozlow, Alvira Philips, MD  benzonatate (TESSALON) 100 MG capsule Take 2 capsules (200 mg total) by mouth 2 (two) times daily as needed for cough. 12/18/17   Bast, Traci A, NP  budesonide (PULMICORT) 1 MG/2ML nebulizer solution Take 2 mLs (1 mg total) by nebulization 4 (four) times daily. During upper respiratory infections and asthma flares for 1-2 weeks. 04/29/18   Ellamae Sia, DO  cetirizine (ZYRTEC) 10 MG tablet Take 1 tablet (10 mg total) by mouth daily. 03/16/18   Kozlow, Alvira Philips, MD  cloNIDine (CATAPRES) 0.1 MG tablet TAKE 1 AND 1/2 TABLET BY MOUTH AT BEDTIME Patient taking differently: Take 0.15 mg by mouth at bedtime.  02/20/18   Elveria Rising, NP  DEPAKOTE 500 MG DR tablet TAKE 1 TABLET BY MOUTH IN THE MORNING AND 2 AT NIGHT Patient taking differently: Take 500-1,000 mg by mouth as directed. TAKE 1 (500 MG)  TABLET BY MOUTH IN THE MORNING AND 2 (1000 MG) tablet AT NIGHT 03/16/18   Elveria Rising, NP  famotidine (PEPCID) 20 MG tablet Take 1 tablet (20 mg total) by mouth every 12 (twelve) hours. Pharmacy-please d/c rx for ranitidine 03/19/18   Pyrtle, Carie Caddy, MD  formoterol (PERFOROMIST) 20 MCG/2ML nebulizer solution Take 2 mLs (20 mcg total) by nebulization 2 (two) times daily. 03/16/18   Kozlow, Alvira Philips, MD  Levonorgestrel-Ethinyl Estradiol (SEASONIQUE) 0.15-0.03 &0.01 MG tablet Take 1 tablet by mouth daily. 01/29/18   Sharyon Cable, CNM  Melatonin 3 MG TABS Take 3 mg by mouth at bedtime as needed (for sleep).     [provider]  mometasone (NASONEX) 50 MCG/ACT nasal spray Place 2 sprays into the nose daily. 03/17/18   Kozlow, Alvira Philips, MD  montelukast (SINGULAIR) 10 MG tablet Take 1 tablet (10 mg total) by mouth at bedtime. 03/16/18   Kozlow, Alvira Philips, MD  ondansetron (ZOFRAN-ODT) 4 MG disintegrating tablet DISSOLVE 1 TABLET(4 MG) ON THE TONGUE EVERY 8 HOURS AS NEEDED FOR NAUSEA OR VOMITING 04/30/18   Myrlene Broker, MD  polyethylene glycol powder (GLYCOLAX/MIRALAX) powder Dissolve 17 grasm in at least 8 ounces water/juice and drink by mouth twice daily Patient taking differently: Take 17 g by mouth daily as needed for mild constipation.  02/18/18   Pyrtle, Carie Caddy, MD  Revefenacin (YUPELRI) 175 MCG/3ML SOLN Inhale 1 vial into the lungs daily. 03/19/18   Kozlow, Alvira Philips, MD  senna (SENOKOT) 8.6 MG tablet Take 1 tablet by mouth at bedtime.     [provider]  terconazole (TERAZOL 3) 0.8 % vaginal cream USE TOPICALLY IF NEEDED FOR YEAST 04/20/18   [provider]  traZODone (DESYREL) 50 MG tablet Take 2 tablets (100 mg total) by mouth at bedtime. 02/20/18   Elveria Rising, NP    Family History Family History  Problem Relation Age of Onset  . Seizures Mother   . Seizures Sister        1 sister has  . Pancreatic cancer Sister   . Colon cancer Neg Hx   . Colon polyps Neg  Hx   . Esophageal cancer Neg Hx   . Rectal cancer Neg Hx   . Stomach cancer Neg Hx     Social History Social History   Tobacco Use  . Smoking status: Never Smoker  . Smokeless tobacco: Never Used  Substance Use Topics  . Alcohol use: No    Alcohol/week: 0.0 standard drinks  . Drug use: No     Allergies   Abilify [aripiprazole]; Seroquel [quetiapine fumarate]; Bactrim [sulfamethoxazole-trimethoprim]; Doxycycline; Amoxicillin-pot clavulanate; and Keppra [levetiracetam]   Review of Systems Review of Systems  Unable to perform ROS: Other     Physical Exam Updated Vital Signs BP 100/69 (BP Location: Left Arm)   Pulse 92   Temp 98.3 F (36.8 C) (Oral)   Resp 20   Ht 4\' 9"  (1.448 m)   Wt 83 kg   SpO2 100%   BMI 39.60 kg/m   Physical Exam Vitals signs and nursing note reviewed.  Constitutional:      Appearance: She is well-developed.  HENT:     Head: Normocephalic and atraumatic.     Right Ear: Tympanic membrane and ear canal normal.     Left Ear: Tympanic membrane and ear canal normal.     Nose: Nose normal.     Mouth/Throat:     Lips: Pink.     Mouth: Mucous membranes are moist.     Pharynx: Oropharynx is clear.     Comments: Tonsils overall normal in appearance bilaterally without exudate; uvula midline without evidence of peritonsillar abscess; handling secretions appropriately; no difficulty swallowing or speaking; normal phonation without stridor Eyes:     Conjunctiva/sclera: Conjunctivae normal.     Pupils: Pupils are equal, round, and reactive to light.  Neck:     Musculoskeletal: Normal range of motion.  Cardiovascular:     Rate and Rhythm: Normal rate and regular rhythm.     Heart sounds: Normal heart sounds.  Pulmonary:     Effort: Pulmonary effort is normal.     Breath sounds: Normal breath sounds. No decreased breath sounds, wheezing or rhonchi.     Comments: Lungs are clear without any noted wheezes or rhonchi, her respirations are unlabored,  she did have few episodes of dry cough (mother looks to me and states this is how she was "  gasping" at home); O2 sats 100% on RA Abdominal:     General: Bowel sounds are normal.     Palpations: Abdomen is soft.  Musculoskeletal: Normal range of motion.  Skin:    General: Skin is warm and dry.  Neurological:     Mental Status: She is alert and oriented to person, place, and time.      ED Treatments / Results  Labs (all labs ordered are listed, but only abnormal results are displayed) Labs Reviewed  CBC WITH DIFFERENTIAL/PLATELET - Abnormal; Notable for the following components:      Result Value   WBC 13.4 (*)    RBC 3.84 (*)    Hemoglobin 11.7 (*)    Lymphs Abs 4.6 (*)    Monocytes Absolute 1.3 (*)    Abs Immature Granulocytes 0.68 (*)    All other components within normal limits  BASIC METABOLIC PANEL - Abnormal; Notable for the following components:   Glucose, Bld 151 (*)    Calcium 8.5 (*)    All other components within normal limits  GROUP A STREP BY PCR    EKG None  Radiology Dg Chest 2 View  Result Date: 06/06/2018 CLINICAL DATA:  Shortness of breath and cough. EXAM: CHEST - 2 VIEW COMPARISON:  05/23/2018 FINDINGS: AP and lateral views obtained. Low lung volumes. Basilar hazy opacities likely related to atelectasis and hypo expansion. Pneumonia can not be excluded. No evidence for pleural effusion. The cardiopericardial silhouette is within normal limits for size. The visualized bony structures of the thorax are intact. IMPRESSION: Low lung volumes with hazy basilar opacities likely atelectasis although pneumonia not excluded. Electronically Signed   By: Kennith Center M.D.   On: 06/06/2018 23:14    Procedures Procedures (including critical care time)  Medications Ordered in ED Medications  sodium chloride 0.9 % bolus 1,000 mL (0 mLs Intravenous Stopped 06/07/18 0038)  ondansetron (ZOFRAN) injection 4 mg (4 mg Intravenous Given 06/06/18 2320)     Initial  Impression / Assessment and Plan / ED Course  I have reviewed the triage vital signs and the nursing notes.  Pertinent labs & imaging results that were available during my care of the patient were reviewed by me and considered in my medical decision making (see chart for details).  23 year old female here with mother with concern of recurrent pneumonia.  She was treated for this about 2 weeks ago.  Has also complained of sore throat and some vomiting today.  She is afebrile and nontoxic in appearance here.  She does not have any signs of respiratory distress, intermittent dry cough but no "gasping for air" as mother has reported.  Her vitals are stable and she overall appears clinically well.  Plan for screening labs, chest x-ray, rapid strep.  She was given IV fluids and Zofran.  12:07 AM On re-check, patient resting comfortably.  Her O2 sats remain stable on RA.  She still has some dry coughing but I do not appreciate any "gasping for air" as mother depicts.  Her respirations are unlabored she does not have any wheezes or rhonchi.  She has not had any active emesis here but mother reports she has "coughed up" some phlegm.  Labs and chest x-ray reviewed, overall reassuring, leukocytosis noted but this seems around her baseline compared with prior values.  Rapid strep is negative.  Chest x-ray concerning for hazy basilar opacities, likely felt to represent atelectasis although cannot fully exclude pneumonia.  Given her reported symptoms and recent treatment  for pneumonia, will will start on antibiotics for this.  Recent treatment with azithromycin, so will use levaquin  (allergies to penicillin, doxycycline, sulfa, etc).  Currently on cipro for UTI and due to finish in 2 days, but will have her stop this as Levaquin will give adequate coverage for this as well.  Given her stable vital signs and without any clinical signs concerning for sepsis, I feel she is stable for outpatient management.  She has not had  any recent travel or known exposure to COVID.  Patient is extremely well-appearing here without any fever so I feel this is very unlikely.  Mother will continue normal precautions at home, limited visitation aside from nursing staff will continue.  Can follow-up with PCP.  Return here for any new/acute changes.  Final Clinical Impressions(s) / ED Diagnoses   Final diagnoses:  Cough  Community acquired pneumonia, unspecified laterality    ED Discharge Orders         Ordered    levofloxacin (LEVAQUIN) 750 MG tablet  Daily     06/07/18 0019    predniSONE (DELTASONE) 20 MG tablet     06/07/18 0019    ondansetron (ZOFRAN ODT) 4 MG disintegrating tablet  Every 8 hours PRN     06/07/18 0020    benzonatate (TESSALON) 100 MG capsule  Every 8 hours     06/07/18 0036           Garlon Hatchet, PA-C 06/07/18 2841    Mancel Bale, MD 06/07/18 1110

## 2018-06-07 MED ORDER — ONDANSETRON 4 MG PO TBDP: 4.0000 mg | ORAL_TABLET | Freq: Three times a day (TID) | ORAL | 0 refills | Status: DC | PRN

## 2018-06-07 MED ORDER — BENZONATATE 100 MG PO CAPS: 100.0000 mg | ORAL_CAPSULE | Freq: Three times a day (TID) | ORAL | 0 refills | Status: DC

## 2018-06-07 MED ORDER — LEVOFLOXACIN 750 MG PO TABS: 750.0000 mg | ORAL_TABLET | Freq: Every day | ORAL | 0 refills | Status: DC

## 2018-06-07 MED ORDER — PREDNISONE 20 MG PO TABS: ORAL_TABLET | ORAL | 0 refills | Status: DC

## 2018-06-07 NOTE — Discharge Instructions (Addendum)
Take the prescribed medication as directed.  You can stop the ciprofloxacin as of now, the Levaquin will cover for UTI so no need to take both antibiotics. Follow-up with your primary care doctor. Return to the ED for new or worsening symptoms.

## 2018-06-08 ENCOUNTER — Telehealth: Payer: Self-pay | Admitting: Allergy and Immunology

## 2018-06-08 ENCOUNTER — Telehealth: Payer: Self-pay | Admitting: Internal Medicine

## 2018-06-08 ENCOUNTER — Telehealth: Payer: Self-pay | Admitting: Pulmonary Disease

## 2018-06-08 ENCOUNTER — Telehealth: Payer: Self-pay | Admitting: Physical Therapy

## 2018-06-08 MED ORDER — BENZONATATE 200 MG PO CAPS: 200.0000 mg | ORAL_CAPSULE | Freq: Three times a day (TID) | ORAL | 0 refills | Status: DC | PRN

## 2018-06-08 NOTE — Telephone Encounter (Signed)
Called patients mother, unable to reach LMTCB 

## 2018-06-08 NOTE — Telephone Encounter (Signed)
Recommend Mucinex and Delsym cough syrup twice a day (she can get these over the counter).   I am fine with sending tessalon perles 200mg  three times a day for cough. There are coupons on [employer]. ([employer], [facility] and [facility] on [address] have discount with coupon between [employer]).   Encourage fluids and tylenol prn fever. Continue Levaquin as prescribed and prn albuterol every 4-6 hours. If breathing worsens or she develops persistent fever return to ED.

## 2018-06-08 NOTE — Telephone Encounter (Signed)
Pt mom  is calling back  [phone]

## 2018-06-08 NOTE — Telephone Encounter (Signed)
A little confused by message, original call stated that mother could not pick up tessalon perles 200mg  until Friday d.t cost. If she has prescription already continue taking as directed. Unsure why she cant take over the counter medication, however, we can order a flutter valve to assist with mucus clearance. Looks like prednisone taper was already prescribed, take as directed. Continue Albuterol every 4-6 hours for wheezing/shortness of breath. Encourage patient practice deep breathing exercises. Please order flutter valve for patient to use every hour for congestion re: cough. Thanks

## 2018-06-08 NOTE — Telephone Encounter (Signed)
Call to pt's mom to inform her of the message from Dr Lucie Leather.  Mom states that she does not want to bring her out in the community.  She is still waiting to talk to her pulmonology clinic.

## 2018-06-08 NOTE — Addendum Note (Signed)
Addended by: Hillard Danker A on: 06/08/2018 09:28 AM   Modules accepted: Orders

## 2018-06-08 NOTE — Telephone Encounter (Signed)
Pt mom states that she still has pneumonia.  Mom states she took her to [facility], now back on Levaquin.  No new fevers, body aches or chills that are new.  Mom states that medication Yupelri not working. Mom is asking for a steroid shot.   Mom states that when she is laying down at night she is still gasping for air.  Does not want to take her back to ER because she was told to wait. Will call the pulmonology clinic back.  Wants to know what your recommendations are for her.

## 2018-06-08 NOTE — Telephone Encounter (Signed)
Called patients mother, unable to reach [facility]

## 2018-06-08 NOTE — Telephone Encounter (Signed)
Please inform Lori Miles's mom that she is just too complex to address issues over the phone.  I will be happy to see her in clinic to address these issues.  I did look over her chest x-ray that she had on 06 June 2018 and there did not appear to be any significant pneumonia but sometimes it is difficult to tell with a chest x-ray what is exactly going on in the lung..  It would be best if she did not go to the emergency room any further given all the problems you are having with viral infections in the ER.

## 2018-06-08 NOTE — Telephone Encounter (Signed)
Lashonda's mother, Karolee Ohs, was contacted today regarding the temporary closing of [facility] due to Covid-19.  Therapist discussed: plan to reschedule PT appt(s) when [facility] reopens - and pt has recovered from her current illness, as mother reports pt has pneumonia at this time.   Patient's mother is not interested in further information for an e-visit, virtual check in, or telehealth visit, if those services become available.    [facility] will follow up with patients when we are able to resume care.  Kerry Fort, PT [facility] [address] [place], [place]  [address] Phone:  [phone] Fax:  [phone] \

## 2018-06-08 NOTE — Telephone Encounter (Signed)
Called and spoke with pt's mother Lonie Peak stating the information per Lemay. Per [name], pt cannot take any OTC meds. Pt also already has tessalon perles.   I stated to [name] that if pt's breathing was worse, she needs to return back to the ED per Beth's recommendations and [name] stated she called the ED and was told to call our office.  [name] states that she believes pt might need to have a steroid injection due to being so congested and since per [name] pt cannot take any OTC meds.  Beth, please advise. Thanks!

## 2018-06-08 NOTE — Telephone Encounter (Signed)
Pt mother called back, wanted for her to be seen tomorrow.  Pt mother was offered an appointment at 1030 to see Dr Lucie Leather.  Mom states that she still can't breathe.  I explained to mom that if she could not breathe, she may need to take her back to the ER, and if she did if she could call us and cancel the appointment for tomorrow.

## 2018-06-08 NOTE — Telephone Encounter (Signed)
Pt's mom is calling back [phone]

## 2018-06-08 NOTE — Telephone Encounter (Signed)
Primary Pulmonologist: Sood Last office visit and with whom: 05/28/18, TP What do we see them for (pulmonary problems): asthma Last OV assessment/plan:  Instructions      Return in about 2 months (around 07/28/2018) for Follow up Dr. Craige Cotta.  Continue current regimen Follow-up with Dr. Craige Cotta  in 2 months and As needed.          Was appointment offered to patient (explain)?  No per office protocol   Reason for call:  Patient's Mother stated she took the Patient to th [facility] ED, 06/06/18, for [facility] and cough.  Patient's Mother stated they were prescribed tessalon 100mg , she stated 200mg  is the only dose that will work.  Patient's Mother stated she is unable to get Tessalon until Friday, because of cost. Patient's Mother did get the Levaquin prescription, and that is started. Patient's Mother stated Patient is having a productive cough, with thick green sputum, wheezing, vomiting, and SHOB.  Patient's Mother stated Patient is grabbing at her throat and chest.  Patient's Mother denies fever. Patient's Mother stated the ED tried swabbing her throat, but could not get a good swab, from the back of her throat. Patient is being given Albuterol for her wheezing and SHOB.  Patient's Mother stated nothing is helping for long. Patient's Mother stated the ED wanted to admit Patient for pna, but decided to send her home, to avoid being exposed other illnesses at this time.  Message routed to Ames Dura, NP to advise

## 2018-06-08 NOTE — Telephone Encounter (Signed)
Sent in

## 2018-06-08 NOTE — Telephone Encounter (Signed)
noted 

## 2018-06-08 NOTE — Telephone Encounter (Signed)
Left message for patients mother to call back. ° °

## 2018-06-08 NOTE — Telephone Encounter (Signed)
Called and spoke with patients mother, they did go to the ER over the weekend. The note was sent to [facility] per patients mother. Patients mom called in today with concern of patients cough. See other phone note dated 06/08/18   Will close this encounter.

## 2018-06-08 NOTE — Telephone Encounter (Signed)
Mom called to give an update on some new medicine that Lori Miles was put on. Mom says she has not gotten better.

## 2018-06-08 NOTE — Telephone Encounter (Signed)
[name] called to request higher dose of Tessalon Perles.  Was in ER over weekend and was diagnosed with pneumonia; was prescribed Tessalon Perles 100 mg every 8 hrs.  Stated she has been on Tessalon Perles 200 mg. in the past.  Is asking for Dr. Okey Dupre to order the higher dose.  Mother reported the pt. Is wheezing and coughing up green phlegm.  Also stated she is gasping for air.  Advised she should send pt. Back to ER.  Mother declined, and stated she prefers to call her Pulmonologist.  Advised her symptoms warrant taking her back to hospital.  Mother declined.  Will send message to Dr. Okey Dupre to make her aware of current symptoms.

## 2018-06-09 ENCOUNTER — Other Ambulatory Visit: Payer: Self-pay

## 2018-06-09 ENCOUNTER — Ambulatory Visit (INDEPENDENT_AMBULATORY_CARE_PROVIDER_SITE_OTHER): Payer: Medicare Other | Admitting: Allergy and Immunology

## 2018-06-09 ENCOUNTER — Ambulatory Visit: Payer: Medicare Other | Admitting: Physical Therapy

## 2018-06-09 VITALS — HR 102 | Temp 99.4°F | Resp 20

## 2018-06-09 DIAGNOSIS — J4540 Moderate persistent asthma, uncomplicated: Secondary | ICD-10-CM

## 2018-06-09 DIAGNOSIS — K219 Gastro-esophageal reflux disease without esophagitis: Secondary | ICD-10-CM

## 2018-06-09 DIAGNOSIS — J310 Chronic rhinitis: Secondary | ICD-10-CM | POA: Diagnosis not present

## 2018-06-09 MED ORDER — BUDESONIDE 1 MG/2ML IN SUSP: 1.0000 mg | Freq: Four times a day (QID) | RESPIRATORY_TRACT | 2 refills | Status: DC

## 2018-06-09 NOTE — Telephone Encounter (Signed)
Sorry to hear she can not take these .  Called patient . She was seen this morning with Asthma /Allergy MD  Nothing further needed.

## 2018-06-09 NOTE — Patient Instructions (Addendum)
  1. Continue a combination of the following:   A. budesonide 1 mg nebulized twice a day  B. Perforomist nebulized twice a day  C. Yupelri nebulized daily  C. Nasonex one spray daily  D. montelukast 10 mg daily  E. cetirizine 10 mg daily  2. Continue to Treat reflux with a combination of the following:   A. Famotidine 40mg  twice a day  B. No caffeine or chocolate consumption  3. Continue albuterol nebulization every 4-6 hours if needed  4. "Action plan" for asthma flare up:   A. increase budesonide to 4 times a day  5. Finish current prescription for Levaquin and prednisone. Attempt to remain away from prednisone in the future.  6. Return to clinic in 4 weeks or earlier if problem

## 2018-06-09 NOTE — Progress Notes (Signed)
[place] [facility] [place] [facility] [place] [facility] [place] [facility] [place]   Follow-up Note  Referring Provider: Myrlene Broker, * Primary Provider: Myrlene Broker, MD Date of Office Visit: 06/09/2018  Subjective:   Lori Miles (DOB: 05/07/1995) is a 23 y.o. female who [facility] in re-evaluation of the following:  HPI: Lori Miles returns to this clinic in reevaluation of multiple respiratory tract issues and reflux issues.  I have not seen her in this clinic since 17 March 2018.  At that point in time she had a history of recurrent medical interventions icluding emergency room, urgent care, and primary doctor requiring the administration of systemic steroids and antibiotics on a frequent basis for a combination of respiratory tract symptoms and urinary tract symptoms.  She has continued to visit with the emergency room and pulmonology and primary care and urgent care for recurrent issues with coughing.  Her mom is especially concerned about the fact that she has "gasping" at nighttime.  Her mom is absolutely convinced that she needs more steroids and the more steroid she has the better she does and the more antibiotic she has the better she does.  Just over the course of the past several weeks she has been treated with amoxicillin, ciprofloxacin, and is currently on Levaquin for her respiratory tract symptoms and has been treated with 3 courses of systemic steroids during the past month or so.  It is hard to nail down exactly what the problem is regarding Fancy asshe does not communicate and has very difficult time answering questions.  According to her mom, as stated above, her mom is very concerned about the fact that she has gasping at nighttime.  She has apparently had a sleep study which did not identify any significant sleep apnea but there has been an attempt to obtain serial oxygen saturation studies while sleeping but for some reason  those are not completed.  She remains on a large collection of anti-inflammatory agents for her airway including nebulized steroids and nebulized long-acting bronchodilator and nebulized anticholinergic agent and she continues to use Nasonex and Singulair on a regular basis.  Her reflux appears to be okay but she still has some issues with complaining about her throat hurting and occasionally it hurts when she swallows and she has been diagnosed with recurrent pharyngitis and once again treated with antibiotics for this issue.  She is currently using Pepcid 20 mg twice a day.  Allergies as of 06/09/2018      Reactions   Abilify [aripiprazole] Swelling, Palpitations   Seroquel [quetiapine Fumarate] Palpitations   Bactrim [sulfamethoxazole-trimethoprim] Other (See Comments)   Abdominal pain   Doxycycline Other (See Comments)   Stomach cramps   Amoxicillin-pot Clavulanate Hives   Has patient had a PCN reaction causing immediate rash, facial/tongue/throat swelling, SOB or lightheadedness with hypotension: No Has patient had a PCN reaction causing severe rash involving mucus membranes or skin necrosis: No Has patient had a PCN reaction that required hospitalization: No Has patient had a PCN reaction occurring within the last 10 years: No If all of the above answers are "NO", then may proceed with Cephalosporin use.   Keppra [levetiracetam] Other (See Comments)   Aggressive behavior       Medication List      acetaminophen 325 MG tablet Commonly known as:  TYLENOL Take 2 tablets (650 mg total) by mouth every 6 (six) hours as needed for mild pain or moderate pain.  albuterol (2.5 MG/3ML) 0.083% nebulizer solution Commonly known as:  PROVENTIL INHALE CONTENTS OF 1 VIAL IN NEBULIZER EVERY 4 TO 6 HOURS IF NEEDED FOR COUGH OR WHEEZING   benzonatate 200 MG capsule Commonly known as:  TESSALON Take 1 capsule (200 mg total) by mouth 3 (three) times daily as needed.   budesonide 1 MG/2ML  nebulizer solution Commonly known as:  PULMICORT Take 2 mLs (1 mg total) by nebulization 4 (four) times daily. During upper respiratory infections and asthma flares for 1-2 weeks.   cetirizine 10 MG tablet Commonly known as:  ZYRTEC Take 1 tablet (10 mg total) by mouth daily.   cloNIDine 0.1 MG tablet Commonly known as:  CATAPRES TAKE 1 AND 1/2 TABLET BY MOUTH AT BEDTIME   Depakote 500 MG DR tablet Generic drug:  divalproex TAKE 1 TABLET BY MOUTH IN THE MORNING AND 2 AT NIGHT   famotidine 20 MG tablet Commonly known as:  Pepcid Take 1 tablet (20 mg total) by mouth every 12 (twelve) hours. Pharmacy-please d/c rx for ranitidine   formoterol 20 MCG/2ML nebulizer solution Commonly known as:  Perforomist Take 2 mLs (20 mcg total) by nebulization 2 (two) times daily.   levofloxacin 750 MG tablet Commonly known as:  Levaquin Take 1 tablet (750 mg total) by mouth daily.   Levonorgestrel-Ethinyl Estradiol 0.15-0.03 &0.01 MG tablet Commonly known as:  Seasonique Take 1 tablet by mouth daily.   Melatonin 3 MG Tabs Take 3 mg by mouth at bedtime as needed (for sleep).   mometasone 50 MCG/ACT nasal spray Commonly known as:  NASONEX Place 2 sprays into the nose daily.   montelukast 10 MG tablet Commonly known as:  SINGULAIR Take 1 tablet (10 mg total) by mouth at bedtime.   ondansetron 4 MG disintegrating tablet Commonly known as:  Zofran ODT Take 1 tablet (4 mg total) by mouth every 8 (eight) hours as needed for nausea.   polyethylene glycol powder powder Commonly known as:  GLYCOLAX/MIRALAX Dissolve 17 grasm in at least 8 ounces water/juice and drink by mouth twice daily   predniSONE 20 MG tablet Commonly known as:  DELTASONE Take 40 mg by mouth daily for 3 days, then 20mg  by mouth daily for 3 days, then 10mg  daily for 3 days   Revefenacin 175 MCG/3ML Soln Commonly known as:  Yupelri Inhale 1 vial into the lungs daily.   senna 8.6 MG tablet Commonly known as:  SENOKOT  Take 1 tablet by mouth at bedtime.   terconazole 0.8 % vaginal cream Commonly known as:  TERAZOL 3 USE TOPICALLY IF NEEDED FOR YEAST   traZODone 50 MG tablet Commonly known as:  DESYREL Take 2 tablets (100 mg total) by mouth at bedtime.       Past Medical History:  Diagnosis Date  . Asthma   . Autism    mom states no actual dx, but admits she has some symptoms  . Bacteriuria   . Chronic constipation   . Cognitive developmental delay   . Disruptive behavior disorder   . Family history of adverse reaction to anesthesia    mother had itching after anesthesia one time  . Frequency-urgency syndrome   . Gait disorder    ORGANIC PER NERUOLGIST NOTE (DR HICKLING)  . 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 GERD (gastroesophageal reflux disease)   . Gout   . H/O sinus tachycardia   . History of acute respiratory failure 02/14/2015   SECONDARY TO CAP  . History  of recurrent UTIs   . Interstitial cystitis   . Moderate intellectual disability   . OSA (obstructive sleep apnea)    no machine yet   . Partial epilepsy with impairment of consciousness (HCC)    FOLLOWED BY DR HICKLING  . Pneumonia 2018 last time  . PONV (postoperative nausea and vomiting)   . Seizure (HCC)    most recent sz " at the end of summer"-last sz years ago per mother  . Urinary incontinence     Past Surgical History:  Procedure Laterality Date  . CYSTOSCOPY N/A 06/03/2017   Procedure: CYSTOSCOPY WITH EXAM UNDER ANESTHESIA;  Surgeon: Jerilee Field, MD;  Location: [facility];  Service: Urology;  Laterality: N/A;  . CYSTOSCOPY WITH HYDRODISTENSION AND BIOPSY  04/14/2012   Procedure: CYSTOSCOPY/BIOPSY/HYDRODISTENSION;  Surgeon: Lindaann Slough, MD;  Location: [facility];  Service: Urology;;  instillation of marcaine and pyridium  . EUA/  PAP SMEAR/  BREAST EXAM  03-12-2016   dr Erin Fulling [facility]  . EUA/ VAGINOSCOPY/ COLPOSCOPY FO THE HYMENAL RING   10-04-2009    dr Tamela Oddi  [facility]  . TONSILLECTOMY      Review of systems negative except as noted in HPI / PMHx or noted below:  Review of Systems  Constitutional: Negative.   HENT: Negative.   Eyes: Negative.   Respiratory: Negative.   Cardiovascular: Negative.   Gastrointestinal: Negative.   Genitourinary: Negative.   Musculoskeletal: Negative.   Skin: Negative.   Neurological: Negative.   Endo/Heme/Allergies: Negative.   Psychiatric/Behavioral: Negative.      Objective:   Vitals:   06/09/18 0916  Pulse: (!) 102  Resp: 20  Temp: 99.4 F (37.4 C)  SpO2: 94%          Physical Exam Constitutional:      Appearance: She is not diaphoretic.  HENT:     Head: Normocephalic.     Right Ear: Tympanic membrane, ear canal and external ear normal.     Left Ear: Tympanic membrane, ear canal and external ear normal.     Nose: Nose normal. No mucosal edema or rhinorrhea.     Mouth/Throat:     Pharynx: Uvula midline. Posterior oropharyngeal erythema present. No oropharyngeal exudate.  Eyes:     Conjunctiva/sclera: Conjunctivae normal.  Neck:     Thyroid: No thyromegaly.     Trachea: Trachea normal. No tracheal tenderness or tracheal deviation.  Cardiovascular:     Rate and Rhythm: Normal rate and regular rhythm.     Heart sounds: Normal heart sounds, S1 normal and S2 normal. No murmur.  Pulmonary:     Effort: No respiratory distress.     Breath sounds: Normal breath sounds. No stridor. No wheezing or rales.  Lymphadenopathy:     Head:     Right side of head: No tonsillar adenopathy.     Left side of head: No tonsillar adenopathy.     Cervical: No cervical adenopathy.  Skin:    Findings: No erythema or rash.     Nails: There is no clubbing.   Neurological:     Mental Status: She is alert.     Diagnostics:    Spirometry was not performed.  Results of blood tests obtained 28 April 2018 identified creatinine 0.87 mg/DL, AST 16 U/L, ALT 12 U/L  Results of  blood tests obtained 21 March 202020 identified WBC 13.4, absolute eosinophil 0, absolute lymphocyte 4600, hemoglobin 11.7, platelet 172, immature granulocytes 0.68.  Results of a pharyngeal strep  test obtained 06 June 2018 was negative.  Results of a CXR obtained 06 June 2018 identified the following:  Low lung volumes with hazy basilar opacities likely atelectasis although pneumonia not excluded.   Assessment and Plan:   1. Not well controlled moderate persistent asthma   2. Chronic rhinitis   3. Gastroesophageal reflux disease, esophagitis presence not specified     1. Continue a combination of the following:   A. budesonide 1 mg nebulized twice a day  B. Perforomist nebulized twice a day  C. Yupelri nebulized daily  C. Nasonex one spray daily  D. montelukast 10 mg daily  E. cetirizine 10 mg daily  2. Continue to Treat reflux with a combination of the following:   A. Famotidine 40mg  twice a day  B. No caffeine or chocolate consumption  3. Continue albuterol nebulization every 4-6 hours if needed  4. "Action plan" for asthma flare up:   A. increase budesonide to 4 times a day  5. Finish current prescription for Levaquin and prednisone. Attempt to remain away from prednisone in the future.  6. Return to clinic in 4 weeks or earlier if problem  There always appears to be a significant disconnect between objective evidence of significant organ dysfunction and Amrit's mother's assessment of how Erikka is doing symptomatically.  I have asked Nabila's mother on several occasions not to have her get recurrent antibiotics and steroids as we will probably end up producing a significant iatrogenic problem with this approach.  After I had a discussion with Tommie's mom today about this issue she immediately asked for a steroid shot to help Shelsy's respiratory tract symptoms.  I did have her increase her dose of H2 receptor blocker as there may be a component of reflux giving  rise to some of her symptoms.  I will see her back in this clinic in 4 weeks or earlier if there is a problem.  Laurette Schimke, MD Allergy / Immunology [facility]

## 2018-06-09 NOTE — Telephone Encounter (Signed)
Called and spoke with patient's mother. She stated that the patient could not take the tessalon perles due to VS not wanting the patient to take these. Patient's mother asked that we send a message over to TP to have her call the patients mother back. Patients mother would not tell me current symptoms of patient or what was going on. She asked that TP call her personally. Please advise, thank you.

## 2018-06-09 NOTE — Telephone Encounter (Signed)
Pt's other called back throh answering service stating that daughter is unable to take medicine  Prescribed she is throwing up medicine.Caren Griffins

## 2018-06-10 ENCOUNTER — Encounter: Payer: Self-pay | Admitting: Allergy and Immunology

## 2018-06-11 ENCOUNTER — Other Ambulatory Visit: Payer: Self-pay

## 2018-06-11 MED ORDER — BUDESONIDE 1 MG/2ML IN SUSP: 1.0000 mg | Freq: Four times a day (QID) | RESPIRATORY_TRACT | 1 refills | Status: DC

## 2018-06-11 NOTE — Telephone Encounter (Signed)
Mom called back to request more Pulmicort nebulizer solution. I have sent this in to the pharmacy.

## 2018-06-12 ENCOUNTER — Telehealth (INDEPENDENT_AMBULATORY_CARE_PROVIDER_SITE_OTHER): Payer: Self-pay | Admitting: Family

## 2018-06-12 DIAGNOSIS — F819 Developmental disorder of scholastic skills, unspecified: Secondary | ICD-10-CM

## 2018-06-12 DIAGNOSIS — Z79899 Other long term (current) drug therapy: Secondary | ICD-10-CM

## 2018-06-12 DIAGNOSIS — G40209 Localization-related (focal) (partial) symptomatic epilepsy and epileptic syndromes with complex partial seizures, not intractable, without status epilepticus: Secondary | ICD-10-CM

## 2018-06-12 DIAGNOSIS — G40309 Generalized idiopathic epilepsy and epileptic syndromes, not intractable, without status epilepticus: Secondary | ICD-10-CM

## 2018-06-12 NOTE — Addendum Note (Signed)
Addended by: Princella Ion on: 06/12/2018 09:54 AM   Modules accepted: Orders

## 2018-06-12 NOTE — Telephone Encounter (Signed)
Mom sent an email Wednesday night asking me to call. I was out of the office yesterday. I called her today and Mom said that Lori Miles had a seizure on Wednesday 06/10/18 like she had years ago. Mom said that Lori Miles was walking toward her when she suddenly began jerking and Mom caught her as she fell to the floor. Lori Miles was not injured when she fell. Mom said that Lori Miles was jerking all extremities, not responsive to her, eyes rolled back. Mom estimates event lasted 10 minutes before Lori Miles stopped jerking and looked at her mother. Lori Miles has been sick recently and has been treated for pneumonia, but has been improving. I told Mom that the seizure may have been triggered by illness but that we should check trough Depakote level. I explained to Mom had to do that and Mom will take her early next week to get blood drawn. I asked Mom to let me know if Lori Miles had more seizures. Mom knows to call 911 if she has more seizures that are prolonged, if she turns blue or if she is injured as part of seizure. I will call Mom when lab results are available. TG

## 2018-06-12 NOTE — Telephone Encounter (Signed)
Noted, I agree with this plan, thank you

## 2018-06-15 DIAGNOSIS — R531 Weakness: Secondary | ICD-10-CM | POA: Diagnosis not present

## 2018-06-15 DIAGNOSIS — Z79899 Other long term (current) drug therapy: Secondary | ICD-10-CM | POA: Diagnosis not present

## 2018-06-15 DIAGNOSIS — F819 Developmental disorder of scholastic skills, unspecified: Secondary | ICD-10-CM | POA: Diagnosis not present

## 2018-06-16 ENCOUNTER — Ambulatory Visit: Payer: Medicare Other | Admitting: Physical Therapy

## 2018-06-16 LAB — CK: CK TOTAL: 45 U/L (ref 29–143)

## 2018-06-16 LAB — VALPROIC ACID LEVEL: Valproic Acid Lvl: 103.1 mg/L — ABNORMAL HIGH (ref 50.0–100.0)

## 2018-06-20 DIAGNOSIS — G4733 Obstructive sleep apnea (adult) (pediatric): Secondary | ICD-10-CM | POA: Diagnosis not present

## 2018-06-27 DIAGNOSIS — J4520 Mild intermittent asthma, uncomplicated: Secondary | ICD-10-CM | POA: Diagnosis not present

## 2018-06-27 DIAGNOSIS — G809 Cerebral palsy, unspecified: Secondary | ICD-10-CM | POA: Diagnosis not present

## 2018-06-29 ENCOUNTER — Other Ambulatory Visit: Payer: Self-pay

## 2018-06-29 ENCOUNTER — Telehealth (INDEPENDENT_AMBULATORY_CARE_PROVIDER_SITE_OTHER): Payer: Self-pay | Admitting: Family

## 2018-06-29 ENCOUNTER — Encounter (HOSPITAL_COMMUNITY): Payer: Self-pay | Admitting: Family Medicine

## 2018-06-29 ENCOUNTER — Ambulatory Visit (HOSPITAL_COMMUNITY)
Admission: EM | Admit: 2018-06-29 | Discharge: 2018-06-29 | Disposition: A | Discharge status: Home / Self Care | Payer: Medicare Other | Attending: Family Medicine | Admitting: Family Medicine

## 2018-06-29 DIAGNOSIS — R21 Rash and other nonspecific skin eruption: Secondary | ICD-10-CM

## 2018-06-29 DIAGNOSIS — I951 Orthostatic hypotension: Secondary | ICD-10-CM

## 2018-06-29 DIAGNOSIS — R32 Unspecified urinary incontinence: Secondary | ICD-10-CM | POA: Diagnosis not present

## 2018-06-29 DIAGNOSIS — J449 Chronic obstructive pulmonary disease, unspecified: Secondary | ICD-10-CM | POA: Diagnosis not present

## 2018-06-29 LAB — POCT URINALYSIS DIP (DEVICE)
Glucose, UA: 100 mg/dL — AB
Ketones, ur: 15 mg/dL — AB
Leukocytes,Ua: NEGATIVE
Nitrite: NEGATIVE
Protein, ur: 30 mg/dL — AB
Specific Gravity, Urine: >=1.03 (ref 1.005–1.030)
Urobilinogen, UA: 1 mg/dL (ref 0.0–1.0)
pH: 5 (ref 5.0–8.0)

## 2018-06-29 LAB — GLUCOSE, CAPILLARY: Glucose-Capillary: 163 mg/dL — ABNORMAL HIGH (ref 70–99)

## 2018-06-29 MED ORDER — CLOTRIMAZOLE-BETAMETHASONE 1-0.05 % EX CREA: TOPICAL_CREAM | CUTANEOUS | 0 refills | Status: DC

## 2018-06-29 NOTE — Discharge Instructions (Signed)
Stop the evening clonidine since it lowers blood pressure so much.  If symptoms continue, you can return for further evaluation or see your doctors.

## 2018-06-29 NOTE — ED Triage Notes (Signed)
C/o discolored urine with odor, also states possible perineal abscess

## 2018-06-29 NOTE — Telephone Encounter (Signed)
I read your note and agree.  I am reluctant to consider a medicine like trazodone which might work out better for her as regards going to sleep.

## 2018-06-29 NOTE — ED Provider Notes (Signed)
[facility]    CSN: [id number] Arrival date & time: 06/29/18  4098     History   Chief Complaint Chief Complaint  Patient presents with  . Urinary Tract Infection    HPI Lori Miles is a 23 y.o. female.   23 yo autistic woman who, as established [facility] patient, presents with UTI symptoms.  She has been taking first Keflex, then switched to Levaquin but still having frequency.  Patient also falling quite a bit.  She normally goes to PT for her unstable gait, but that stopped with the coronavirus lockdown.  She also has urinary incontinence and bilateral inner thigh irritation.  She keeps scratching the inner aspect of each thigh.  History given by caretaker.     Past Medical History:  Diagnosis Date  . Asthma   . Autism    mom states no actual dx, but admits she has some symptoms  . Bacteriuria   . Chronic constipation   . Cognitive developmental delay   . Disruptive behavior disorder   . Family history of adverse reaction to anesthesia    mother had itching after anesthesia one time  . Frequency-urgency syndrome   . Gait disorder    ORGANIC PER NERUOLGIST NOTE (DR HICKLING)  . 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 GERD (gastroesophageal reflux disease)   . Gout   . H/O sinus tachycardia   . History of acute respiratory failure 02/14/2015   SECONDARY TO CAP  . History of recurrent UTIs   . Interstitial cystitis   . Moderate intellectual disability   . OSA (obstructive sleep apnea)    no machine yet   . Partial epilepsy with impairment of consciousness (HCC)    FOLLOWED BY DR HICKLING  . Pneumonia 2018 last time  . PONV (postoperative nausea and vomiting)   . Seizure (HCC)    most recent sz " at the end of summer"-last sz years ago per mother  . Urinary incontinence     Patient Active Problem List   Diagnosis Date Noted  . Glucosuria 06/01/2018  . 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 Not well  controlled moderate persistent asthma 04/29/2018  . Gastroesophageal reflux disease 04/29/2018  . Recurrent infections 04/29/2018  . Dysuria 02/20/2018  . Chronic rhinitis 04/03/2017  . Recurrent falls 03/21/2017  . Weakness 03/21/2017  . Venous insufficiency 03/21/2017  . Excessive daytime sleepiness 03/05/2016  . 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 Cough variant asthma vs UACS/ vcd  03/15/2015  . Autism spectrum disorder 02/14/2015  . Nausea with vomiting 02/14/2015  . Obesity, morbid (HCC) 12/13/2014  . 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 Acanthosis nigricans 11/14/2014  . Toeing-in 08/29/2014  . 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 Recurrent urinary tract infection 08/01/2011  . Asthma 08/01/2011  . Chronic constipation 08/01/2011  . Contraception 08/01/2011    Past Surgical History:  Procedure Laterality Date  . CYSTOSCOPY N/A 06/03/2017   Procedure: CYSTOSCOPY WITH EXAM UNDER ANESTHESIA;  Surgeon: Jerilee Field, MD;  Location: [facility];  Service: Urology;  Laterality: N/A;  . CYSTOSCOPY WITH HYDRODISTENSION AND BIOPSY  04/14/2012   Procedure: CYSTOSCOPY/BIOPSY/HYDRODISTENSION;  Surgeon: Lindaann Slough, MD;  Location: [facility];  Service: Urology;;  instillation of marcaine and pyridium  . EUA/  PAP SMEAR/  BREAST EXAM  03-12-2016   dr Erin Fulling [facility]  . EUA/ VAGINOSCOPY/ COLPOSCOPY FO THE HYMENAL RING  10-04-2009    dr Tamela Oddi  [facility]  . TONSILLECTOMY      OB History   No obstetric history on file.      Home Medications    Prior to Admission medications   Medication Sig Start Date End Date Taking? Authorizing Provider  acetaminophen (TYLENOL) 325 MG tablet Take 2 tablets (650 mg total)  by mouth every 6 (six) hours as needed for mild pain or moderate pain. 02/27/17   Everlene Farrier, PA-C  albuterol (PROVENTIL) (2.5 MG/3ML) 0.083% nebulizer solution INHALE CONTENTS OF 1 VIAL IN NEBULIZER EVERY 4 TO 6 HOURS IF NEEDED FOR COUGH OR WHEEZING 03/17/18   Kozlow, Alvira Philips, MD  budesonide (PULMICORT) 1 MG/2ML nebulizer solution Take 2 mLs (1 mg total) by nebulization 4 (four) times daily. During upper respiratory infections and asthma flares for 1-2 weeks. 06/11/18   Kozlow, Alvira Philips, MD  cetirizine (ZYRTEC) 10 MG tablet Take 1 tablet (10 mg total) by mouth daily. 03/16/18   Kozlow, Alvira Philips, MD  clotrimazole-betamethasone (LOTRISONE) cream Apply to affected area 2 times daily prn 06/29/18   Elvina Sidle, MD  DEPAKOTE 500 MG DR tablet TAKE 1 TABLET BY MOUTH IN THE MORNING AND 2 AT NIGHT Patient taking differently: Take 500-1,000 mg by mouth as directed. TAKE 1 (500 MG) TABLET BY MOUTH IN THE MORNING AND 2 (1000 MG) tablet AT NIGHT 03/16/18   Elveria Rising, NP  famotidine (PEPCID) 20 MG tablet Take 1 tablet (20 mg total) by mouth every 12 (twelve) hours. Pharmacy-please d/c rx for ranitidine 03/19/18   Pyrtle, Carie Caddy, MD  formoterol (PERFOROMIST) 20 MCG/2ML nebulizer solution Take 2 mLs (20 mcg total) by nebulization 2 (two) times daily. 03/16/18   Kozlow, Alvira Philips, MD  Levonorgestrel-Ethinyl Estradiol (SEASONIQUE) 0.15-0.03 &0.01 MG tablet Take 1 tablet by mouth daily. 01/29/18   Sharyon Cable, CNM  Melatonin 3 MG TABS Take 3 mg by mouth at bedtime as needed (for sleep).     [provider]  mometasone (NASONEX) 50 MCG/ACT nasal spray Place 2 sprays into the nose daily. 03/17/18   Kozlow, Alvira Philips, MD  montelukast (SINGULAIR) 10 MG tablet Take 1 tablet (10 mg total) by mouth at bedtime. 03/16/18   Kozlow, Alvira Philips, MD  ondansetron (ZOFRAN ODT) 4 MG disintegrating tablet Take 1 tablet (4 mg total) by mouth every 8 (eight) hours as needed for nausea. 06/07/18   Garlon Hatchet, PA-C   polyethylene glycol powder (GLYCOLAX/MIRALAX) powder Dissolve 17 grasm in at least 8 ounces water/juice and drink by mouth twice daily Patient taking differently: Take 17 g by mouth daily as needed for mild constipation.  02/18/18   Pyrtle, Carie Caddy, MD  Revefenacin (YUPELRI) 175 MCG/3ML SOLN Inhale 1 vial into the lungs daily. 03/19/18   Kozlow, Alvira Philips, MD  senna (SENOKOT) 8.6 MG tablet Take 1 tablet by mouth at bedtime.     [provider]  terconazole (TERAZOL 3) 0.8 % vaginal cream USE TOPICALLY IF NEEDED FOR YEAST 04/20/18   [provider]  traZODone (DESYREL) 50 MG tablet Take 2 tablets (100 mg total) by mouth at bedtime. 02/20/18   Elveria Rising, NP    Family History Family History  Problem Relation Age of Onset  . Seizures Mother   . Seizures Sister        1 sister has  .  Pancreatic cancer Sister   . Colon cancer Neg Hx   . Colon polyps Neg Hx   . Esophageal cancer Neg Hx   . Rectal cancer Neg Hx   . Stomach cancer Neg Hx     Social History Social History   Tobacco Use  . Smoking status: Never Smoker  . Smokeless tobacco: Never Used  Substance Use Topics  . Alcohol use: No    Alcohol/week: 0.0 standard drinks  . Drug use: No     Allergies   Abilify [aripiprazole]; Seroquel [quetiapine fumarate]; Bactrim [sulfamethoxazole-trimethoprim]; Doxycycline; Amoxicillin-pot clavulanate; and Keppra [levetiracetam]   Review of Systems Review of Systems   Physical Exam Triage Vital Signs ED Triage Vitals  Enc Vitals Group     BP      Pulse      Resp      Temp      Temp src      SpO2      Weight      Height      Head Circumference      Peak Flow      Pain Score      Pain Loc      Pain Edu?      Excl. in GC?    Orthostatic VS for the past 24 hrs:  BP- Lying Pulse- Lying BP- Sitting Pulse- Sitting  06/29/18 0917 96/62 112 (!) 86/65 117    Updated Vital Signs BP (!) 78/46   Pulse (!) 114   Temp 98.5 F (36.9 C)   SpO2 99%    Physical  Exam Vitals signs and nursing note reviewed.  Constitutional:      Appearance: Normal appearance. She is obese.  HENT:     Head: Normocephalic.     Mouth/Throat:     Mouth: Mucous membranes are moist.  Eyes:     Conjunctiva/sclera: Conjunctivae normal.  Neck:     Musculoskeletal: Normal range of motion and neck supple.  Cardiovascular:     Rate and Rhythm: Tachycardia present.     Heart sounds: Normal heart sounds.  Pulmonary:     Effort: Pulmonary effort is normal.  Musculoskeletal: Normal range of motion.  Skin:    General: Skin is warm and dry.     Comments: Hyperpigmented inner thighs.  Neurological:     Mental Status: She is alert.     Comments: Patient develops tremor when standing      UC Treatments / Results  Labs (all labs ordered are listed, but only abnormal results are displayed) Labs Reviewed  GLUCOSE, CAPILLARY - Abnormal; Notable for the following components:      Result Value   Glucose-Capillary 163 (*)    All other components within normal limits  POCT URINALYSIS DIP (DEVICE) - Abnormal; Notable for the following components:   Glucose, UA 100 (*)    Bilirubin Urine SMALL (*)    Ketones, ur 15 (*)    Hgb urine dipstick TRACE (*)    Protein, ur 30 (*)    All other components within normal limits  URINE CULTURE  CBG MONITORING, ED    EKG None  Radiology No results found.  Procedures Procedures (including critical care time)  Medications Ordered in UC Medications - No data to display  Initial Impression / Assessment and Plan / UC Course  I have reviewed the triage vital signs and the nursing notes.  Pertinent labs & imaging results that were available during my care of the patient were reviewed  by me and considered in my medical decision making (see chart for details).    Final Clinical Impressions(s) / UC Diagnoses   Final diagnoses:  Rash and nonspecific skin eruption  Orthostatic hypotension  Urinary incontinence, unspecified type      Discharge Instructions     Stop the evening clonidine since it lowers blood pressure so much.  If symptoms continue, you can return for further evaluation or see your doctors.    ED Prescriptions    Medication Sig Dispense Auth. Provider   clotrimazole-betamethasone (LOTRISONE) cream Apply to affected area 2 times daily prn 45 g Elvina SidleLauenstein, Adileny Delon, MD     Controlled Substance Prescriptions [place] Controlled Substance Registry consulted? Not Applicable   Elvina SidleLauenstein, Remmie Bembenek, MD 06/29/18 [phone]

## 2018-06-29 NOTE — Telephone Encounter (Signed)
I received a call from [name] Gagliardo regarding Lori Miles. She said that she was seen in urgent care this morning and that her BP was low. She said that the urgent care provider told her to stop Clonidine because it was making her BP too low, causing her to be weak and causing her feet to swell. Mom said that [facility] was not sleeping at night despite taking Clonidine. I told Mom that it was ok to stop the Clonidine to see if her condition improved. I also told Mom that it was important for [facility] to be well hydrated as inadequate hydration can result in low BP. Mom said that she felt that Aliceson has a UTI because of the appearance of the urine, but that she was told at urgent care that she did not. Mom plans to call her PCP about possible UTI. TG

## 2018-06-30 LAB — URINE CULTURE: Culture: NO GROWTH

## 2018-07-02 ENCOUNTER — Encounter (HOSPITAL_COMMUNITY): Payer: Self-pay

## 2018-07-02 ENCOUNTER — Ambulatory Visit (HOSPITAL_COMMUNITY)
Admission: EM | Admit: 2018-07-02 | Discharge: 2018-07-02 | Disposition: A | Discharge status: Home / Self Care | Payer: Medicare Other | Attending: Internal Medicine | Admitting: Internal Medicine

## 2018-07-02 ENCOUNTER — Other Ambulatory Visit: Payer: Self-pay

## 2018-07-02 ENCOUNTER — Ambulatory Visit (INDEPENDENT_AMBULATORY_CARE_PROVIDER_SITE_OTHER): Payer: Medicare Other

## 2018-07-02 DIAGNOSIS — R05 Cough: Secondary | ICD-10-CM

## 2018-07-02 DIAGNOSIS — N390 Urinary tract infection, site not specified: Secondary | ICD-10-CM

## 2018-07-02 DIAGNOSIS — R059 Cough, unspecified: Secondary | ICD-10-CM

## 2018-07-02 DIAGNOSIS — R062 Wheezing: Secondary | ICD-10-CM

## 2018-07-02 LAB — POCT URINALYSIS DIP (DEVICE)
Glucose, UA: 100 mg/dL — AB
Hgb urine dipstick: NEGATIVE
Leukocytes,Ua: NEGATIVE
Nitrite: NEGATIVE
Protein, ur: NEGATIVE mg/dL
Specific Gravity, Urine: >=1.03 (ref 1.005–1.030)
Urobilinogen, UA: 1 mg/dL (ref 0.0–1.0)
pH: 5 (ref 5.0–8.0)

## 2018-07-02 MED ORDER — LEVOFLOXACIN 500 MG PO TABS: ORAL_TABLET | ORAL | 0 refills | Status: DC

## 2018-07-02 NOTE — ED Provider Notes (Signed)
[facility]    CSN: [id number] Arrival date & time: 07/02/18  1714     History   Chief Complaint Chief Complaint  Patient presents with  . Emesis    HPI Lori Miles is a 23 y.o. female.   The history is provided by a parent. No language interpreter was used.  Emesis  Severity:  Moderate Timing:  Constant Progression:  Worsening Chronicity:  Recurrent Recent urination:  Normal Relieved by:  Nothing Worsened by:  Nothing Ineffective treatments:  None tried Associated symptoms: cough   Associated symptoms: no fever    Patient's history is per mother who has multiple complaints.  Patient is currently on Levaquin for a UTI.  Patient's primary care doctor prescribed 5 days of Levaquin.  Mother reports today patient vomited after having an asthma attack.  She reports patient frequently gets pneumonia and she is worried that she has pneumonia as well as being worried patient still has a UTI.  Patient had a breathing treatment for coming in and mother states she is no longer wheezing.  He was given Zofran which she takes frequently for vomiting Past Medical History:  Diagnosis Date  . Asthma   . Autism    mom states no actual dx, but admits she has some symptoms  . Bacteriuria   . Chronic constipation   . Cognitive developmental delay   . Disruptive behavior disorder   . Family history of adverse reaction to anesthesia    mother had itching after anesthesia one time  . Frequency-urgency syndrome   . Gait disorder    ORGANIC PER NERUOLGIST NOTE (DR HICKLING)  . 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 GERD (gastroesophageal reflux disease)   . Gout   . H/O sinus tachycardia   . History of acute respiratory failure 02/14/2015   SECONDARY TO CAP  . History of recurrent UTIs   . Interstitial cystitis   . Moderate intellectual disability   . OSA (obstructive sleep apnea)    no machine yet   . Partial epilepsy with impairment of  consciousness (HCC)    FOLLOWED BY DR HICKLING  . Pneumonia 2018 last time  . PONV (postoperative nausea and vomiting)   . Seizure (HCC)    most recent sz " at the end of summer"-last sz years ago per mother  . Urinary incontinence     Patient Active Problem List   Diagnosis Date Noted  . Glucosuria 06/01/2018  . 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 Not well controlled moderate persistent asthma 04/29/2018  . Gastroesophageal reflux disease 04/29/2018  . Recurrent infections 04/29/2018  . Dysuria 02/20/2018  . Chronic rhinitis 04/03/2017  . Recurrent falls 03/21/2017  . Weakness 03/21/2017  . Venous insufficiency 03/21/2017  . Excessive daytime sleepiness 03/05/2016  . 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 Cough variant asthma vs UACS/ vcd  03/15/2015  . Autism spectrum disorder 02/14/2015  . Nausea with vomiting 02/14/2015  . Obesity, morbid (HCC) 12/13/2014  . 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 Acanthosis nigricans 11/14/2014  . Toeing-in 08/29/2014  . 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 Recurrent urinary tract infection 08/01/2011  . Asthma 08/01/2011  . Chronic constipation 08/01/2011  . Contraception 08/01/2011    Past Surgical History:  Procedure Laterality Date  . CYSTOSCOPY  N/A 06/03/2017   Procedure: CYSTOSCOPY WITH EXAM UNDER ANESTHESIA;  Surgeon: Jerilee Field, MD;  Location: [facility]  Service: Urology;  Laterality: N/A;  . CYSTOSCOPY WITH HYDRODISTENSION AND BIOPSY  04/14/2012   Procedure: CYSTOSCOPY/BIOPSY/HYDRODISTENSION;  Surgeon: Lindaann Slough, MD;  Location: [facility];  Service: Urology;;  instillation of marcaine and pyridium  . EUA/  PAP SMEAR/  BREAST EXAM  03-12-2016    dr Erin Fulling [facility]  . EUA/ VAGINOSCOPY/ COLPOSCOPY FO THE HYMENAL RING  10-04-2009    dr Tamela Oddi  [facility]  . TONSILLECTOMY      OB History   No obstetric history on file.      Home Medications    Prior to Admission medications   Medication Sig Start Date End Date Taking? Authorizing Provider  acetaminophen (TYLENOL) 325 MG tablet Take 2 tablets (650 mg total) by mouth every 6 (six) hours as needed for mild pain or moderate pain. 02/27/17   Everlene Farrier, PA-C  albuterol (PROVENTIL) (2.5 MG/3ML) 0.083% nebulizer solution INHALE CONTENTS OF 1 VIAL IN NEBULIZER EVERY 4 TO 6 HOURS IF NEEDED FOR COUGH OR WHEEZING 03/17/18   Kozlow, Alvira Philips, MD  budesonide (PULMICORT) 1 MG/2ML nebulizer solution Take 2 mLs (1 mg total) by nebulization 4 (four) times daily. During upper respiratory infections and asthma flares for 1-2 weeks. 06/11/18   Kozlow, Alvira Philips, MD  cetirizine (ZYRTEC) 10 MG tablet Take 1 tablet (10 mg total) by mouth daily. 03/16/18   Kozlow, Alvira Philips, MD  clotrimazole-betamethasone (LOTRISONE) cream Apply to affected area 2 times daily prn 06/29/18   Elvina Sidle, MD  DEPAKOTE 500 MG DR tablet TAKE 1 TABLET BY MOUTH IN THE MORNING AND 2 AT NIGHT Patient taking differently: Take 500-1,000 mg by mouth as directed. TAKE 1 (500 MG) TABLET BY MOUTH IN THE MORNING AND 2 (1000 MG) tablet AT NIGHT 03/16/18   Elveria Rising, NP  famotidine (PEPCID) 20 MG tablet Take 1 tablet (20 mg total) by mouth every 12 (twelve) hours. Pharmacy-please d/c rx for ranitidine 03/19/18   Pyrtle, Carie Caddy, MD  formoterol (PERFOROMIST) 20 MCG/2ML nebulizer solution Take 2 mLs (20 mcg total) by nebulization 2 (two) times daily. 03/16/18   Kozlow, Alvira Philips, MD  levofloxacin (LEVAQUIN) 500 MG tablet One tablet a day for 5 days 07/02/18   Elson Areas, PA-C  Levonorgestrel-Ethinyl Estradiol (SEASONIQUE) 0.15-0.03 &0.01 MG tablet Take 1 tablet by mouth daily. 01/29/18   Sharyon Cable, CNM  Melatonin 3 MG TABS Take  3 mg by mouth at bedtime as needed (for sleep).     [provider]  mometasone (NASONEX) 50 MCG/ACT nasal spray Place 2 sprays into the nose daily. 03/17/18   Kozlow, Alvira Philips, MD  montelukast (SINGULAIR) 10 MG tablet Take 1 tablet (10 mg total) by mouth at bedtime. 03/16/18   Kozlow, Alvira Philips, MD  ondansetron (ZOFRAN ODT) 4 MG disintegrating tablet Take 1 tablet (4 mg total) by mouth every 8 (eight) hours as needed for nausea. 06/07/18   Garlon Hatchet, PA-C  polyethylene glycol powder (GLYCOLAX/MIRALAX) powder Dissolve 17 grasm in at least 8 ounces water/juice and drink by mouth twice daily Patient taking differently: Take 17 g by mouth daily as needed for mild constipation.  02/18/18   Pyrtle, Carie Caddy, MD  Revefenacin (YUPELRI) 175 MCG/3ML SOLN Inhale 1 vial into the lungs daily. 03/19/18   Kozlow, Alvira Philips, MD  senna (SENOKOT) 8.6 MG tablet Take 1 tablet by mouth at  bedtime.     [provider]  terconazole (TERAZOL 3) 0.8 % vaginal cream USE TOPICALLY IF NEEDED FOR YEAST 04/20/18   [provider]  traZODone (DESYREL) 50 MG tablet Take 2 tablets (100 mg total) by mouth at bedtime. 02/20/18   Elveria Rising, NP    Family History Family History  Problem Relation Age of Onset  . Seizures Mother   . Seizures Sister        1 sister has  . Pancreatic cancer Sister   . Colon cancer Neg Hx   . Colon polyps Neg Hx   . Esophageal cancer Neg Hx   . Rectal cancer Neg Hx   . Stomach cancer Neg Hx     Social History Social History   Tobacco Use  . Smoking status: Never Smoker  . Smokeless tobacco: Never Used  Substance Use Topics  . Alcohol use: No    Alcohol/week: 0.0 standard drinks  . Drug use: No     Allergies   Abilify [aripiprazole]; Seroquel [quetiapine fumarate]; Bactrim [sulfamethoxazole-trimethoprim]; Doxycycline; Amoxicillin-pot clavulanate; and Keppra [levetiracetam]   Review of Systems Review of Systems  Constitutional: Negative for fever.   Respiratory: Positive for cough.   Gastrointestinal: Positive for vomiting.     Physical Exam Triage Vital Signs ED Triage Vitals  Enc Vitals Group     BP 07/02/18 1802 (!) 84/60     Pulse Rate 07/02/18 1802 (!) 103     Resp 07/02/18 1802 18     Temp 07/02/18 1802 98.6 F (37 C)     Temp Source 07/02/18 1802 Oral     SpO2 07/02/18 1802 99 %     Weight --      Height --      Head Circumference --      Peak Flow --      Pain Score 07/02/18 1801 0     Pain Loc --      Pain Edu? --      Excl. in GC? --    No data found.  Updated Vital Signs BP (!) 84/60   Pulse (!) 103   Temp 98.6 F (37 C) (Oral)   Resp 18   SpO2 99%   Visual Acuity Right Eye Distance:   Left Eye Distance:   Bilateral Distance:    Right Eye Near:   Left Eye Near:    Bilateral Near:     Physical Exam Vitals signs and nursing note reviewed.  Constitutional:      Appearance: She is well-developed.  HENT:     Head: Normocephalic.     Right Ear: Tympanic membrane normal.     Left Ear: Tympanic membrane normal.     Nose: Nose normal.  Eyes:     Pupils: Pupils are equal, round, and reactive to light.  Neck:     Musculoskeletal: Normal range of motion.  Cardiovascular:     Rate and Rhythm: Normal rate.  Pulmonary:     Effort: Pulmonary effort is normal.  Abdominal:     General: Abdomen is flat. There is no distension.  Musculoskeletal: Normal range of motion.  Skin:    General: Skin is warm.  Neurological:     Mental Status: She is alert and oriented to person, place, and time.  Psychiatric:        Mood and Affect: Mood normal.      UC Treatments / Results  Labs (all labs ordered are listed, but only abnormal results are displayed)  Labs Reviewed  POCT URINALYSIS DIP (DEVICE) - Abnormal; Notable for the following components:      Result Value   Glucose, UA 100 (*)    Bilirubin Urine SMALL (*)    Ketones, ur TRACE (*)    All other components within normal limits     EKG None  Radiology Dg Chest 2 View  Result Date: 07/02/2018 CLINICAL DATA:  Cough and wheezing EXAM: CHEST - 2 VIEW COMPARISON:  06/06/2018 FINDINGS: Cardiac shadows within normal limits. The lungs are hypoinflated. No focal infiltrate or effusion is seen. Mild crowding of the vascular markings is noted related to the poor inspiratory effort. No bony abnormality is seen. IMPRESSION: No acute abnormality noted. Poor inspiratory effort. Electronically Signed   By: Alcide Clever M.D.   On: 07/02/2018 19:26    Procedures Procedures (including critical care time)  Medications Ordered in UC Medications - No data to display  Initial Impression / Assessment and Plan / UC Course  I have reviewed the triage vital signs and the nursing notes.  Pertinent labs & imaging results that were available during my care of the patient were reviewed by me and considered in my medical decision making (see chart for details).     MDM patient has a normal chest x-ray urine is negative the relays a complicated past medical history of juggling UTIs and pneumonia.  I have advised her to continue Levaquin.  She is to follow-up with her primary care physician.  Patient's blood pressure is 84/60.  I reviewed previous blood pressures and this is in the general range.  The reports primary care is working on reducing patient's medicines due to blood pressure.  I have advised mother to return with patient if any problems.  I have advised her to monitor patient's temperature. Final Clinical Impressions(s) / UC Diagnoses   Final diagnoses:  Cough  Urinary tract infection without hematuria, site unspecified   Discharge Instructions   None    ED Prescriptions    Medication Sig Dispense Auth. Provider   levofloxacin (LEVAQUIN) 500 MG tablet One tablet a day for 5 days 5 tablet Elson Areas, [place]     Controlled Substance Prescriptions [place] Controlled Substance Registry consulted? Not Applicable   Elson Areas, [place] 07/02/18 2030

## 2018-07-02 NOTE — ED Notes (Signed)
Patient verbalizes understanding of discharge instructions. Opportunity for questioning and answers were provided. Patient discharged from UCC by provider.  

## 2018-07-02 NOTE — ED Triage Notes (Signed)
Mother states pt has had vomiting for few days , states Dr Yetta Flock called in levaquin

## 2018-07-08 ENCOUNTER — Other Ambulatory Visit: Payer: Self-pay | Admitting: Internal Medicine

## 2018-07-08 ENCOUNTER — Other Ambulatory Visit: Payer: Self-pay | Admitting: Allergy and Immunology

## 2018-07-08 DIAGNOSIS — G809 Cerebral palsy, unspecified: Secondary | ICD-10-CM | POA: Diagnosis not present

## 2018-07-08 DIAGNOSIS — J4520 Mild intermittent asthma, uncomplicated: Secondary | ICD-10-CM | POA: Diagnosis not present

## 2018-07-11 ENCOUNTER — Encounter (HOSPITAL_COMMUNITY): Payer: Self-pay | Admitting: Emergency Medicine

## 2018-07-11 ENCOUNTER — Ambulatory Visit (HOSPITAL_COMMUNITY)
Admission: EM | Admit: 2018-07-11 | Discharge: 2018-07-11 | Disposition: A | Discharge status: Home / Self Care | Payer: Medicare Other | Attending: Family Medicine | Admitting: Family Medicine

## 2018-07-11 ENCOUNTER — Other Ambulatory Visit: Payer: Self-pay

## 2018-07-11 DIAGNOSIS — R1110 Vomiting, unspecified: Secondary | ICD-10-CM

## 2018-07-11 DIAGNOSIS — R062 Wheezing: Secondary | ICD-10-CM | POA: Diagnosis not present

## 2018-07-11 LAB — POCT URINALYSIS DIP (DEVICE)
Glucose, UA: NEGATIVE mg/dL
Hgb urine dipstick: NEGATIVE
Ketones, ur: 15 mg/dL — AB
Leukocytes,Ua: NEGATIVE
Nitrite: NEGATIVE
Protein, ur: 30 mg/dL — AB
Specific Gravity, Urine: 1.02 (ref 1.005–1.030)
Urobilinogen, UA: 2 mg/dL — ABNORMAL HIGH (ref 0.0–1.0)
pH: 7.5 (ref 5.0–8.0)

## 2018-07-11 LAB — GLUCOSE, CAPILLARY: Glucose-Capillary: 136 mg/dL — ABNORMAL HIGH (ref 70–99)

## 2018-07-11 MED ORDER — PREDNISONE 10 MG (21) PO TBPK: ORAL_TABLET | Freq: Every day | ORAL | 0 refills | Status: DC

## 2018-07-11 NOTE — ED Triage Notes (Signed)
Pt c/o vomiting for a few days, taking zofran at home.

## 2018-07-13 ENCOUNTER — Telehealth: Payer: Self-pay | Admitting: Pulmonary Disease

## 2018-07-13 ENCOUNTER — Telehealth: Payer: Self-pay | Admitting: Internal Medicine

## 2018-07-13 ENCOUNTER — Telehealth (HOSPITAL_COMMUNITY): Payer: Self-pay | Admitting: Family Medicine

## 2018-07-13 LAB — URINE CULTURE

## 2018-07-13 NOTE — Telephone Encounter (Signed)
Patient mother called would like to speak to someone in regards to the patient and her her self. Patient mother refused to give me details regarding the situation said she does not want to give me details of her personal business. Would like to speak to the nurse.

## 2018-07-13 NOTE — Telephone Encounter (Signed)
Spoke to mother, poor historian, lengthy conversation, mother reports patient has been "sick" off and on and while sick and in the hospital, antibiotics cause bloody diarrhea 1 month ago that has now resolved. Mother reported that the patient was having normal stools and was taking her Miralax, senna, and famotidine regularly. Mother reported that she preferred not to have a virtual visit and would rather come into the office when the COVID-19 restrictions has been lifted. Mother needed reassurance that she would still be able to get medications, this RN explained to the mother that she should contact the pharmacy for refills.

## 2018-07-13 NOTE — Telephone Encounter (Signed)
Mother calls demanding an antibiotic for a urine infection.  She was seen here 2 d ago with multiple complaints and a urine culture was done.  It was contaminated.  She has had a number of urine cultures that fail to show a true UTI with 100,000 colonies of bacteria.  Mother insists she knows her child and she is "pulling at herself" and acts like her back hurts, and "smells bad' No fever or difficulty with urination I suggested she try to keep child at home since there is a COVID 19 crisis and she is not willing I suggested bathing, pushing fluid and tylenol for pain I suggested calling and keeping regular visits with her PCP I suggested she call her urologist but mother does not want to go to [facility] Mother is talkative and does not listed to suggestion, is argumentative and insists child needs to go to the ER where she can be catheterized. I was unable to convince her to obtain appropriate care for [facility]. YSN

## 2018-07-13 NOTE — Telephone Encounter (Signed)
Called the patients mother back and she stated that she has been trying to get her daughter a CPAP machine, but insurance will not cover it. She contacted the Governor's office and was told to contact the insurance company directly. Medicaid/Medicare told her they would need a letter from [name] saying she was denied the CPAP machine. And that doing this would allow the other insurance to cover the cost of the machine.   Patient mother stated her daughter does not sleep at night and sleeps during the day. She has taken her to urgent care twice and was told to bring her back today so they can check on her. Patient mother stated her choking episodes have been witnessed and they do not understand why she cannot get the CPAP machine to help her.  I asked her for the name of the contact person and phone number, but she did not have it in front of her.  Patient already scheduled for televisit with Rubye Oaks, NP on 07/24/18   Above info routed to [facility] (Dr. Evlyn Courier assistant).  Harvin Hazel, do you happen to know anything about this issue for this patient?

## 2018-07-15 NOTE — Telephone Encounter (Signed)
Called and spoke with patient's mother Lori Miles regarding below message Pt would like VS or TP to write Medicaid a letter stating pt's diagnosis, current health concerns and conditions. The letter needs to include that patient has conducted two separate sleep studies and has not qualified for cpap machine.  [name] stated that she spoke with agent with medicaid regarding her daughters medical condition, and she feels that it is imparitive that the pt have a cpap machine at night. The lady from the Medicaid office advised patient's mother that our office needs to write a letter that pt has not qualified for cpap machine due to having two sleep studies then Medicaid will approve her for the cpap machine as that's pt's mother request. [name] requests pt is in need of a cpap machine to better help her sleep and stop choking at night time. I tried to explain to her that Medicaid cannot approve it like it that, but she would not let me speak much regarding this situation. That Medicaid nor insurance will not cover a cpap machine, mask or supplies without a qualifying sleep study on the patient. The patient's mother disagreed with me, requested the letter be written this week. Advised pt's mother VS is out of the office due to COVID-19, that I will pass this information to TP. TP is out of the office today, once she returns to the office tomorrow, she will advise.  TP please advise. Thank you.

## 2018-07-16 DIAGNOSIS — N3000 Acute cystitis without hematuria: Secondary | ICD-10-CM | POA: Diagnosis not present

## 2018-07-16 NOTE — Telephone Encounter (Signed)
  May send the following letter per mother's request   To whom it may concern :   Lori Miles is a 22 year old female patient at our [facility] that is never smoker that has a complicated medical history that includes cerebral palsy, mental insufficiency with severe behavioral issues and autism., She has a seizure disorder and underlying asthma and allergic rhinitis . Sleep evaluation February 2019 for sleep disturbances suspicious for sleep apnea. However has had 3negative sleep studies for sleep apnea, see below . Sleep study positive for primary snoring . These are her following tests. Although her sleep studies have been negative.  A CPAP titration study did show elimination of her snoring and improved sleep efficiency. Please re-consider approval of CPAP device for her regarding this .   TEST [name] : Sleep study January 06, 2015 at [place] sleep study at [facility] AHI 0.4 (total sleep time 428 minutes)moderate to loud snoring Sleep study March 27, 2016 AHI 0.1 SaO2 low 90%, positive snoring Sleep study December 10, 2017 AHI 0.3, SaO2 low 93% (sleep time 413 minutes) positive snoring  A CPAP titration study was ordered June 2018 with known negative diagnostic sleep study with elimination of snoring on CPAP 10 cm H2O total sleep time 367 minutes with sleep efficiency 98%  Best Regards   Taji Sather NP-C  [facility]   07/16/2018

## 2018-07-16 NOTE — Telephone Encounter (Signed)
Letter drafted for final review and signature by [name] Parrett.

## 2018-07-16 NOTE — Telephone Encounter (Signed)
Called the patient's mother and let her know the letter was ready, but we need to know the name of the person at [facility] and their contact number so we can get the fax # to make sure it has been received. A copy of the letter will be mailed to her as well.  She voiced understanding and will call back with the contact information.

## 2018-07-20 ENCOUNTER — Telehealth: Payer: Self-pay | Admitting: Allergy and Immunology

## 2018-07-20 DIAGNOSIS — G4733 Obstructive sleep apnea (adult) (pediatric): Secondary | ICD-10-CM | POA: Diagnosis not present

## 2018-07-20 MED ORDER — NEBULIZER MISC: 5 refills | Status: DC

## 2018-07-20 NOTE — Telephone Encounter (Signed)
LMTCB x1 for pt's mother.  

## 2018-07-20 NOTE — Telephone Encounter (Signed)
Patient's mom called to request a new prescription be sent for the patient's nebulizer mask and tubing kits. She would like to have this sent to [facility] for processing as she has had a severe issue with the old pharmacy. I have sent this as requested.

## 2018-07-20 NOTE — Telephone Encounter (Signed)
Mom wants to talk, to [facility], about Lori Miles's mask for her nebulizer.

## 2018-07-20 NOTE — Telephone Encounter (Signed)
Returned call to patient's mom, no answer. Unable to leave message.

## 2018-07-21 NOTE — Telephone Encounter (Signed)
Spoke with patient's mother and she still doesn't have name of representative. She will contact office when she get info. FYI: patient sleeping all day per mother. She is breath heavily, mother is having to use a wheelchair. Will leave message open until info received.

## 2018-07-22 ENCOUNTER — Telehealth: Payer: Self-pay | Admitting: Pulmonary Disease

## 2018-07-22 NOTE — Telephone Encounter (Signed)
Kandice from [facility] states can do appeal for the CPAP machine.  The phone number to call is [phone].  Fax number is [phone].  For authorization number phone number is [phone].

## 2018-07-22 NOTE — Telephone Encounter (Signed)
Letter has been printed, stamped with TP's signature and faxed to [facility]. Copy of letter has also been placed in the mail.   Will close this encounter.

## 2018-07-22 NOTE — Telephone Encounter (Signed)
Please refer back to message from 4/27 as there is already a message in there in regards to appeal for CPAP.

## 2018-07-22 NOTE — ED Provider Notes (Signed)
[facility]   [id number] 07/11/18 Arrival Time: 1626  ASSESSMENT & PLAN:  1. Wheezing   2. Non-intractable vomiting, presence of nausea not specified, unspecified vomiting type     Meds ordered this encounter  Medications  . predniSONE (STERAPRED UNI-PAK 21 TAB) 10 MG (21) TBPK tablet    Sig: Take by mouth daily. Take as directed.    Dispense:  21 tablet    Refill:  0   Declines Rx Zofran.  Labs Reviewed  GLUCOSE, CAPILLARY - Abnormal; Notable for the following components:      Result Value   Glucose-Capillary 136 (*)    All other components within normal limits  POCT URINALYSIS DIP (DEVICE) - Abnormal; Notable for the following components:   Bilirubin Urine SMALL (*)    Ketones, ur 15 (*)    Protein, ur 30 (*)    Urobilinogen, UA 2.0 (*)    All other components within normal limits   Asthma precautions given. OTC symptom care as needed.  Follow-up Information    Schedule an appointment as soon as possible for a visit  with Myrlene Broker, MD.   Specialty:  Internal Medicine Contact information: [address] [place] [address] [phone]           Reviewed expectations re: course of current medical issues. Questions answered. Outlined signs and symptoms indicating need for more acute intervention. Patient verbalized understanding. After Visit Summary given.  SUBJECTIVE: History from: patient.  Lori Miles is a 23 y.o. female who presents with complaint of intermittent nausea without emesis. Gradual onset; a few days. Stable. No abdominal or back pain. Slightly decreased PO intake secondary to nausea "but I've been able to eat more today". Has Zofran at home; helps. No diarrhea or constipation. No LMP recorded. (Menstrual status: Oral contraceptives).  No urinary symptoms including frequency, dysuria, hematuria. Also reports mild and intermittent wheezing for the past few days. Stable. Worse at night. No recent illnesses. No  SOB. Albuterol inhaler helps. Ambulatory without difficulty. No CP. Afebrile. No OTC tx.  Social History   Tobacco Use  Smoking Status Never Smoker  Smokeless Tobacco Never Used    ROS: As per HPI. All other systems negative.    OBJECTIVE:  Vitals:   07/11/18 1650  BP: 117/78    RR: 18 Pulse: 78  General appearance: alert; no distress HEENT: [place]; AT; oropharynx moist Neck: supple without LAD Cv: RRR without murmer Lungs: unlabored respirations, mild bilateral expiratory wheezing; cough: absent; no significant respiratory distress Abd: soft; non-tender; normal bowel sounds; no guarding or rebound tenderness Skin: warm and dry Psychological: alert and cooperative; normal mood and affect  Imaging Reviewed: Dg Chest 2 View  Result Date: 07/02/2018 CLINICAL DATA:  Cough and wheezing EXAM: CHEST - 2 VIEW COMPARISON:  06/06/2018 FINDINGS: Cardiac shadows within normal limits. The lungs are hypoinflated. No focal infiltrate or effusion is seen. Mild crowding of the vascular markings is noted related to the poor inspiratory effort. No bony abnormality is seen. IMPRESSION: No acute abnormality noted. Poor inspiratory effort. Electronically Signed   By: Alcide Clever M.D.   On: 07/02/2018 19:26   Allergies  Allergen Reactions  . Abilify [Aripiprazole] Swelling and Palpitations  . Seroquel [Quetiapine Fumarate] Palpitations  . Bactrim [Sulfamethoxazole-Trimethoprim] Other (See Comments)    Abdominal pain  . Doxycycline Other (See Comments)    Stomach cramps  . Amoxicillin-Pot Clavulanate Hives    Has patient had a PCN reaction causing immediate rash, facial/tongue/throat  swelling, SOB or lightheadedness with hypotension: No Has patient had a PCN reaction causing severe rash involving mucus membranes or skin necrosis: No Has patient had a PCN reaction that required hospitalization: No Has patient had a PCN reaction occurring within the last 10 years: No If all of the above answers  are "NO", then may proceed with Cephalosporin use.   Marland Kitchen. Keppra [Levetiracetam] Other (See Comments)    Aggressive behavior     Past Medical History:  Diagnosis Date  . Asthma   . Autism    mom states no actual dx, but admits she has some symptoms  . Bacteriuria   . Chronic constipation   . Cognitive developmental delay   . Disruptive behavior disorder   . Family history of adverse reaction to anesthesia    mother had itching after anesthesia one time  . Frequency-urgency syndrome   . Gait disorder    ORGANIC PER NERUOLGIST NOTE (DR HICKLING)  . 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 GERD (gastroesophageal reflux disease)   . Gout   . H/O sinus tachycardia   . History of acute respiratory failure 02/14/2015   SECONDARY TO CAP  . History of recurrent UTIs   . Interstitial cystitis   . Moderate intellectual disability   . OSA (obstructive sleep apnea)    no machine yet   . Partial epilepsy with impairment of consciousness (HCC)    FOLLOWED BY DR HICKLING  . Pneumonia 2018 last time  . PONV (postoperative nausea and vomiting)   . Seizure (HCC)    most recent sz " at the end of summer"-last sz years ago per mother  . Urinary incontinence    Family History  Problem Relation Age of Onset  . Seizures Mother   . Seizures Sister        1 sister has  . Pancreatic cancer Sister   . Colon cancer Neg Hx   . Colon polyps Neg Hx   . Esophageal cancer Neg Hx   . Rectal cancer Neg Hx   . Stomach cancer Neg Hx    Social History   Socioeconomic History  . Marital status: Single    Spouse name: Not on file  . Number of children: Not on file  . Years of education: Not on file  . Highest education level: Not on file  Occupational History  . Not on file  Social Needs  . Financial resource strain: Not on file  . Food insecurity:    Worry: Not on file    Inability: Not on file  . Transportation needs:    Medical: Not on file    Non-medical: Not on  file  Tobacco Use  . Smoking status: Never Smoker  . Smokeless tobacco: Never Used  Substance and Sexual Activity  . Alcohol use: No    Alcohol/week: 0.0 standard drinks  . Drug use: No  . Sexual activity: Never    Birth control/protection: Pill  Lifestyle  . Physical activity:    Days per week: Not on file    Minutes per session: Not on file  . Stress: Not on file  Relationships  . Social connections:    Talks on phone: Not on file    Gets together: Not on file    Attends religious service: Not on file    Active member of club or organization: Not on file    Attends meetings of clubs or organizations: Not on file    Relationship status:  Not on file  . Intimate partner violence:    Fear of current or ex partner: Not on file    Emotionally abused: Not on file    Physically abused: Not on file    Forced sexual activity: Not on file  Other Topics Concern  . Not on file  Social History Narrative   Lives with mom (Lori Miles) and half-sister Lori Miles) who is also mentally impaired.  Has CAP assistance, urinary incontinence, needs help with feeding,dressing, toileting   Graduated from [employer].  She enjoys playing with cars, dancing, and dogs.            Mardella Layman, MD 07/25/18 [phone]

## 2018-07-23 ENCOUNTER — Emergency Department (HOSPITAL_COMMUNITY)
Admission: EM | Admit: 2018-07-23 | Discharge: 2018-07-23 | Disposition: A | Discharge status: Home / Self Care | Payer: Medicare Other | Attending: Emergency Medicine | Admitting: Emergency Medicine

## 2018-07-23 ENCOUNTER — Telehealth (INDEPENDENT_AMBULATORY_CARE_PROVIDER_SITE_OTHER): Payer: Self-pay | Admitting: Family

## 2018-07-23 ENCOUNTER — Encounter (HOSPITAL_COMMUNITY): Payer: Self-pay | Admitting: Emergency Medicine

## 2018-07-23 ENCOUNTER — Telehealth: Payer: Self-pay | Admitting: Pulmonary Disease

## 2018-07-23 ENCOUNTER — Other Ambulatory Visit: Payer: Self-pay

## 2018-07-23 ENCOUNTER — Telehealth: Payer: Self-pay | Admitting: Internal Medicine

## 2018-07-23 DIAGNOSIS — R300 Dysuria: Secondary | ICD-10-CM | POA: Insufficient documentation

## 2018-07-23 DIAGNOSIS — Z79899 Other long term (current) drug therapy: Secondary | ICD-10-CM | POA: Diagnosis not present

## 2018-07-23 DIAGNOSIS — N39498 Other specified urinary incontinence: Secondary | ICD-10-CM | POA: Diagnosis present

## 2018-07-23 DIAGNOSIS — R569 Unspecified convulsions: Secondary | ICD-10-CM | POA: Diagnosis not present

## 2018-07-23 DIAGNOSIS — F840 Autistic disorder: Secondary | ICD-10-CM | POA: Diagnosis not present

## 2018-07-23 DIAGNOSIS — N390 Urinary tract infection, site not specified: Secondary | ICD-10-CM | POA: Diagnosis not present

## 2018-07-23 DIAGNOSIS — Z87440 Personal history of urinary (tract) infections: Secondary | ICD-10-CM | POA: Insufficient documentation

## 2018-07-23 LAB — URINALYSIS, ROUTINE W REFLEX MICROSCOPIC
Bilirubin Urine: NEGATIVE
Glucose, UA: >=500 mg/dL — AB
Hgb urine dipstick: NEGATIVE
Ketones, ur: 5 mg/dL — AB
Leukocytes,Ua: NEGATIVE
Nitrite: NEGATIVE
Protein, ur: NEGATIVE mg/dL
Specific Gravity, Urine: 1.022 (ref 1.005–1.030)
pH: 8 (ref 5.0–8.0)

## 2018-07-23 LAB — COMPREHENSIVE METABOLIC PANEL
ALT: 13 U/L (ref 0–44)
AST: 16 U/L (ref 15–41)
Albumin: 2.7 g/dL — ABNORMAL LOW (ref 3.5–5.0)
Alkaline Phosphatase: 39 U/L (ref 38–126)
Anion gap: 9 (ref 5–15)
BUN: 6 mg/dL (ref 6–20)
CO2: 24 mmol/L (ref 22–32)
Calcium: 9.1 mg/dL (ref 8.9–10.3)
Chloride: 106 mmol/L (ref 98–111)
Creatinine, Ser: 0.9 mg/dL (ref 0.44–1.00)
GFR calc Af Amer: >60 mL/min (ref >60)
GFR calc non Af Amer: >60 mL/min (ref >60)
Glucose, Bld: 172 mg/dL — ABNORMAL HIGH (ref 70–99)
Potassium: 4.1 mmol/L (ref 3.5–5.1)
Sodium: 139 mmol/L (ref 135–145)
Total Bilirubin: 0.2 mg/dL — ABNORMAL LOW (ref 0.3–1.2)
Total Protein: 5.7 g/dL — ABNORMAL LOW (ref 6.5–8.1)

## 2018-07-23 LAB — CBC
HCT: 36 % (ref 36.0–46.0)
Hemoglobin: 11.6 g/dL — ABNORMAL LOW (ref 12.0–15.0)
MCH: 30.7 pg (ref 26.0–34.0)
MCHC: 32.2 g/dL (ref 30.0–36.0)
MCV: 95.2 fL (ref 80.0–100.0)
Platelets: 204 10*3/uL (ref 150–400)
RBC: 3.78 MIL/uL — ABNORMAL LOW (ref 3.87–5.11)
RDW: 14.5 % (ref 11.5–15.5)
WBC: 12 10*3/uL — ABNORMAL HIGH (ref 4.0–10.5)
nRBC: 0 % (ref 0.0–0.2)

## 2018-07-23 LAB — LIPASE, BLOOD: Lipase: 27 U/L (ref 11–51)

## 2018-07-23 LAB — I-STAT BETA HCG BLOOD, ED (MC, WL, AP ONLY): I-stat hCG, quantitative: <5 m[IU]/mL (ref <5)

## 2018-07-23 LAB — VALPROIC ACID LEVEL: Valproic Acid Lvl: 67 ug/mL (ref 50.0–100.0)

## 2018-07-23 MED ORDER — LEVOFLOXACIN 750 MG PO TABS
750.0000 mg | ORAL_TABLET | Freq: Every day | ORAL | Status: DC
  Administered 2018-07-23: 750 mg via ORAL
  Filled 2018-07-23: qty 1

## 2018-07-23 MED ORDER — LEVOFLOXACIN 750 MG PO TABS: 750.0000 mg | ORAL_TABLET | Freq: Every day | ORAL | 0 refills | Status: AC

## 2018-07-23 MED ORDER — SODIUM CHLORIDE 0.9% FLUSH: 3.0000 mL | Freq: Once | INTRAVENOUS | Status: DC

## 2018-07-23 NOTE — ED Notes (Signed)
Patient verbalizes understanding of discharge instructions. Opportunity for questioning and answers were provided. Armband removed by staff, pt discharged from ED via wheelchair to home.  

## 2018-07-23 NOTE — Telephone Encounter (Signed)
[name] left a message asking for call back. I called Mom and she said that Lori Miles had a seizure in the car today and that she was currently in the ER being evaluated. I told Mom that I will call her tomorrow to discuss further and she agreed. TG

## 2018-07-23 NOTE — Telephone Encounter (Signed)
Patients mother called stating she believes patient has a UTI.  Also, states that patient just had a seizure and that her feet were swollen.  After speaking with Delaney Meigs I have advised patient's mother to take patient to the ED.  I have informed her to call back in the morning to schedule a follow up with Dr. Okey Dupre.

## 2018-07-23 NOTE — ED Provider Notes (Signed)
[facility] EMERGENCY DEPARTMENT Provider Note   CSN: [id number] Arrival date & time: 07/23/18  1638    History   Chief Complaint Chief Complaint  Patient presents with  . Recurrent UTI  . Emesis    HPI Lori Miles is a 23 y.o. female with a  PMHx of of autism, seizures and recurrent UTIs who presents to the ED with concern for recurrent UTI.  Mom states that she recently had a UTI and was put on cefdinir that ended yesterday however mom feels that her UTIs still there is patient is still having incontinence at night.  Mom states that she only has incontinence at night with UTIs. Mom also states she had an episode of emesis 2 days ago that are common with her UTIs. Also endorses a seizure today that lasted a couple seconds.      HPI  Past Medical History:  Diagnosis Date  . Asthma   . Autism    mom states no actual dx, but admits she has some symptoms  . Bacteriuria   . Chronic constipation   . Cognitive developmental delay   . Disruptive behavior disorder   . Family history of adverse reaction to anesthesia    mother had itching after anesthesia one time  . Frequency-urgency syndrome   . Gait disorder    ORGANIC PER NERUOLGIST NOTE (DR HICKLING)  . 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 GERD (gastroesophageal reflux disease)   . Gout   . H/O sinus tachycardia   . History of acute respiratory failure 02/14/2015   SECONDARY TO CAP  . History of recurrent UTIs   . Interstitial cystitis   . Moderate intellectual disability   . OSA (obstructive sleep apnea)    no machine yet   . Partial epilepsy with impairment of consciousness (HCC)    FOLLOWED BY DR HICKLING  . Pneumonia 2018 last time  . PONV (postoperative nausea and vomiting)   . Seizure (HCC)    most recent sz " at the end of summer"-last sz years ago per mother  . Urinary incontinence     Patient Active Problem List   Diagnosis Date Noted  . Glucosuria  06/01/2018  . 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 Not well controlled moderate persistent asthma 04/29/2018  . Gastroesophageal reflux disease 04/29/2018  . Recurrent infections 04/29/2018  . Dysuria 02/20/2018  . Chronic rhinitis 04/03/2017  . Recurrent falls 03/21/2017  . Weakness 03/21/2017  . Venous insufficiency 03/21/2017  . Excessive daytime sleepiness 03/05/2016  . 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 Cough variant asthma vs UACS/ vcd  03/15/2015  . Autism spectrum disorder 02/14/2015  . Nausea with vomiting 02/14/2015  . Obesity, morbid (HCC) 12/13/2014  . 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 Acanthosis nigricans 11/14/2014  . Toeing-in 08/29/2014  . 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 Recurrent urinary tract infection 08/01/2011  . Asthma 08/01/2011  . Chronic constipation 08/01/2011  . Contraception 08/01/2011    Past Surgical History:  Procedure Laterality Date  . CYSTOSCOPY N/A 06/03/2017   Procedure: CYSTOSCOPY WITH EXAM UNDER ANESTHESIA;  Surgeon: Jerilee Field, MD;  Location: [facility];  Service: Urology;  Laterality: N/A;  . CYSTOSCOPY WITH HYDRODISTENSION AND BIOPSY  04/14/2012   Procedure: CYSTOSCOPY/BIOPSY/HYDRODISTENSION;  Surgeon:  Lindaann SloughMarc-Henry Nesi, MD;  Location: [facility];  Service: Urology;;  instillation of marcaine and pyridium  . EUA/  PAP SMEAR/  BREAST EXAM  03-12-2016   dr [facility]way[facility]  . EUA/ VAGINOSCOPY/ COLPOSCOPY FO THE HYMENAL RING  10-04-2009    dr Tam[facility]  . TONSILLECTOMY       OB History   No obstetric history on file.      Home Medications    Prior to Admission medications   Medication Sig Start Date End Date Taking? Authorizing Provider   acetaminophen (TYLENOL) 325 MG tablet Take 2 tablets (650 mg total) by mouth every 6 (six) hours as needed for mild pain or moderate pain. 02/27/17   Everlene Farrieransie, William, PA-C  albuterol (PROVENTIL) (2.5 MG/3ML) 0.083% nebulizer solution INHALE CONTENTS OF 1 VIAL IN NEBULIZER EVERY 4 TO 6 HOURS IF NEEDED FOR COUGH OR WHEEZING 03/17/18   Kozlow, Alvira PhilipsEric J, MD  budesonide (PULMICORT) 1 MG/2ML nebulizer solution Take 2 mLs (1 mg total) by nebulization 4 (four) times daily. During upper respiratory infections and asthma flares for 1-2 weeks. 06/11/18   Kozlow, Alvira PhilipsEric J, MD  cetirizine (ZYRTEC) 10 MG tablet TAKE 1 TABLET(10 MG) BY MOUTH DAILY 07/08/18   Kozlow, Alvira PhilipsEric J, MD  clotrimazole-betamethasone (LOTRISONE) cream Apply to affected area 2 times daily prn 06/29/18   Elvina SidleLauenstein, Kurt, MD  DEPAKOTE 500 MG DR tablet TAKE 1 TABLET BY MOUTH IN THE MORNING AND 2 AT NIGHT Patient taking differently: Take 500-1,000 mg by mouth as directed. TAKE 1 (500 MG) TABLET BY MOUTH IN THE MORNING AND 2 (1000 MG) tablet AT NIGHT 03/16/18   Elveria RisingGoodpasture, Tina, NP  famotidine (PEPCID) 20 MG tablet TAKE 1 TABLET BY MOUTH EVERY 12 HOURS. DISCONTINUE RANITIDINE 07/08/18   Pyrtle, Carie CaddyJay M, MD  formoterol (PERFOROMIST) 20 MCG/2ML nebulizer solution Take 2 mLs (20 mcg total) by nebulization 2 (two) times daily. 03/16/18   Kozlow, Alvira PhilipsEric J, MD  levofloxacin (LEVAQUIN) 750 MG tablet Take 1 tablet (750 mg total) by mouth daily for 4 days. 07/23/18 07/27/18  Dicky DoeFord, Kaysi Ourada, MD  Levonorgestrel-Ethinyl Estradiol (SEASONIQUE) 0.15-0.03 &0.01 MG tablet Take 1 tablet by mouth daily. 01/29/18   Sharyon Cableogers, Veronica C, CNM  Melatonin 3 MG TABS Take 3 mg by mouth at bedtime as needed (for sleep).     [provider]  mometasone (NASONEX) 50 MCG/ACT nasal spray Place 2 sprays into the nose daily. 03/17/18   Kozlow, Alvira PhilipsEric J, MD  montelukast (SINGULAIR) 10 MG tablet TAKE 1 TABLET(10 MG) BY MOUTH AT BEDTIME 07/08/18   Kozlow, Alvira PhilipsEric J, MD  Nebulizer MISC Adult mask  with tubing. Use as directed with nebulizer device. 07/20/18   Kozlow, Alvira PhilipsEric J, MD  ondansetron (ZOFRAN ODT) 4 MG disintegrating tablet Take 1 tablet (4 mg total) by mouth every 8 (eight) hours as needed for nausea. 06/07/18   Garlon HatchetSanders, Lisa M, PA-C  polyethylene glycol powder (GLYCOLAX/MIRALAX) powder Dissolve 17 grasm in at least 8 ounces water/juice and drink by mouth twice daily Patient taking differently: Take 17 g by mouth daily as needed for mild constipation.  02/18/18   Pyrtle, Carie CaddyJay M, MD  predniSONE (STERAPRED UNI-PAK 21 TAB) 10 MG (21) TBPK tablet Take by mouth daily. Take as directed. 07/11/18   Mardella LaymanHagler, Brian, MD  Revefenacin (YUPELRI) 175 MCG/3ML SOLN Inhale 1 vial into the lungs daily. 03/19/18   Kozlow, Alvira PhilipsEric J, MD  senna (SENOKOT) 8.6 MG tablet Take 1 tablet by mouth at bedtime.  [provider]  terconazole (TERAZOL 3) 0.8 % vaginal cream USE TOPICALLY IF NEEDED FOR YEAST 04/20/18   [provider]  traZODone (DESYREL) 50 MG tablet Take 2 tablets (100 mg total) by mouth at bedtime. 02/20/18   Elveria Rising, NP    Family History Family History  Problem Relation Age of Onset  . Seizures Mother   . Seizures Sister        1 sister has  . Pancreatic cancer Sister   . Colon cancer Neg Hx   . Colon polyps Neg Hx   . Esophageal cancer Neg Hx   . Rectal cancer Neg Hx   . Stomach cancer Neg Hx     Social History Social History   Tobacco Use  . Smoking status: Never Smoker  . Smokeless tobacco: Never Used  Substance Use Topics  . Alcohol use: No    Alcohol/week: 0.0 standard drinks  . Drug use: No     Allergies   Abilify [aripiprazole]; Seroquel [quetiapine fumarate]; Bactrim [sulfamethoxazole-trimethoprim]; Doxycycline; Amoxicillin-pot clavulanate; and Keppra [levetiracetam]   Review of Systems Review of Systems  Constitutional: Negative for chills, fatigue and fever.  HENT: Negative for ear pain and sore throat.   Eyes: Negative for pain and visual  disturbance.  Respiratory: Negative for cough and shortness of breath.   Cardiovascular: Negative for chest pain and palpitations.  Gastrointestinal: Positive for vomiting (one episode 2 days prior). Negative for abdominal pain.  Genitourinary: Positive for dysuria and enuresis. Negative for hematuria.  Musculoskeletal: Negative for arthralgias and back pain.  Skin: Negative for color change and rash.  Neurological: Positive for seizures (one that lasted "seconds" today). Negative for syncope and headaches.  Psychiatric/Behavioral: Negative for confusion.  All other systems reviewed and are negative.    Physical Exam Updated Vital Signs BP (!) 136/100   Pulse 95   Temp 98.2 F (36.8 C) (Oral)   Resp (!) 28   SpO2 99%   Physical Exam Vitals signs and nursing note reviewed.  Constitutional:      General: She is not in acute distress.    Appearance: She is well-developed. She is not ill-appearing.  HENT:     Head: Normocephalic and atraumatic.  Eyes:     Conjunctiva/sclera: Conjunctivae normal.  Neck:     Musculoskeletal: Neck supple.  Cardiovascular:     Rate and Rhythm: Normal rate and regular rhythm.     Heart sounds: No murmur.  Pulmonary:     Effort: Pulmonary effort is normal. No respiratory distress.     Breath sounds: Normal breath sounds.  Abdominal:     General: There is no distension.     Palpations: Abdomen is soft.     Tenderness: There is no abdominal tenderness. There is no right CVA tenderness, left CVA tenderness or guarding.  Musculoskeletal:     Comments: Edema to the bilateral feet  Skin:    General: Skin is warm and dry.  Neurological:     General: No focal deficit present.     Mental Status: She is alert and oriented to person, place, and time. Mental status is at baseline.      ED Treatments / Results  Labs (all labs ordered are listed, but only abnormal results are displayed) Labs Reviewed  COMPREHENSIVE METABOLIC PANEL - Abnormal;  Notable for the following components:      Result Value   Glucose, Bld 172 (*)    Total Protein 5.7 (*)    Albumin 2.7 (*)  Total Bilirubin 0.2 (*)    All other components within normal limits  CBC - Abnormal; Notable for the following components:   WBC 12.0 (*)    RBC 3.78 (*)    Hemoglobin 11.6 (*)    All other components within normal limits  URINALYSIS, ROUTINE W REFLEX MICROSCOPIC - Abnormal; Notable for the following components:   Glucose, UA >=500 (*)    Ketones, ur 5 (*)    Bacteria, UA RARE (*)    All other components within normal limits  LIPASE, BLOOD  VALPROIC ACID LEVEL  I-STAT BETA HCG BLOOD, ED ([facility], [facility], [facility] ONLY)    EKG None  Radiology No results found.  Procedures Procedures (including critical care time)  Medications Ordered in ED Medications  sodium chloride flush (NS) 0.9 % injection 3 mL (3 mLs Intravenous Not Given 07/23/18 2123)  levofloxacin (LEVAQUIN) tablet 750 mg (750 mg Oral Given 07/23/18 2130)     Initial Impression / Assessment and Plan / ED Course  I have reviewed the triage vital signs and the nursing notes.  Pertinent labs & imaging results that were available during my care of the patient were reviewed by me and considered in my medical decision making (see chart for details).        Lori Miles is a 23 y.o. female with a  PMHx of of autism, seizures and recurrent UTIs who presents to the ED with concern for recurrent UTI.  Mom states that she recently had a UTI and was put on cefdinir that ended yesterday however mom feels that her UTIs still there is patient is still having incontinence at night.  Mom states that she only has incontinence at night with UTIs. Mom also states she had an episode of emesis 2 days ago that are common with her UTIs. Also endorses a seizure today that lasted a couple seconds.  On initial exam patient is well appearing, not in acute distress. VSS (initially tachycardic in triage but 90s on my exam).  Physical exam as above, patient alert and oriented. Non-focal neuro exam. Does not appear post-ictal.   ED interpretation of Labs: lipase WNL, CBC and CMP without emergent findings. UA with rare bacteria, Valproic acid 67   Medications given in ED: Levaquin    Given history, concern for ongoing UTI after recent abx. Mom states Levaquin is the only thing that works for her. Discussed the risk with Levaquin, mom states understanding and is still requesting it.  Discharge instructions: I explained the diagnosis and have given explicit precautions to return to the ER for any other new or worsening symptoms. Patient is in understanding of plan. All questions answered at this time.  Discharge instructions concerning home care and prescriptions have been given.  The patient is STABLE and is discharged to home in good condition.      Final Clinical Impressions(s) / ED Diagnoses   Final diagnoses:  Lower urinary tract infectious disease    ED Discharge Orders         Ordered    levofloxacin (LEVAQUIN) 750 MG tablet  Daily     07/23/18 2208           Dicky Doe, MD 07/23/18 2308    Geoffery Lyons, MD 07/24/18 1528

## 2018-07-23 NOTE — ED Triage Notes (Signed)
Pt BIB mother, mom reports pt recently treated for UTI but is still having urinary frequency, nausea and vomiting. Also reports bilateral foot/ankle swelling.

## 2018-07-23 NOTE — Telephone Encounter (Addendum)
Spoke with mom, she states the [facility] insurance needed office visit notes, xrays, and anything that would help with the appeal. In the previous notes it looks like we spoke to someone named Katrina so I was going to call her to see what I should fax. I called [employer], they have no recollection of a CPAP denial. Pt's mom stated they were coming in tomorrow for an office visit with TP. We can discuss this at the OV because most likely they will need the notes from the OV as well.

## 2018-07-23 NOTE — Discharge Instructions (Signed)
Patient with bacteria in urine. Abx switched to Levaquin.

## 2018-07-24 ENCOUNTER — Other Ambulatory Visit: Payer: Self-pay

## 2018-07-24 ENCOUNTER — Telehealth: Payer: Self-pay | Admitting: Adult Health

## 2018-07-24 ENCOUNTER — Ambulatory Visit (INDEPENDENT_AMBULATORY_CARE_PROVIDER_SITE_OTHER): Payer: Medicare Other | Admitting: Adult Health

## 2018-07-24 ENCOUNTER — Encounter: Payer: Self-pay | Admitting: Adult Health

## 2018-07-24 ENCOUNTER — Ambulatory Visit: Payer: Medicare Other | Admitting: Pulmonary Disease

## 2018-07-24 DIAGNOSIS — J310 Chronic rhinitis: Secondary | ICD-10-CM

## 2018-07-24 DIAGNOSIS — J4540 Moderate persistent asthma, uncomplicated: Secondary | ICD-10-CM

## 2018-07-24 DIAGNOSIS — G4719 Other hypersomnia: Secondary | ICD-10-CM | POA: Diagnosis not present

## 2018-07-24 DIAGNOSIS — N3020 Other chronic cystitis without hematuria: Secondary | ICD-10-CM | POA: Diagnosis not present

## 2018-07-24 NOTE — Telephone Encounter (Signed)
Noted  

## 2018-07-24 NOTE — Telephone Encounter (Signed)
I called the number listed and this only an outbound number service line. I could not get in contact with Lori Miles. Will await her return call, pleas see if someone can get on the phone when she calls back.

## 2018-07-24 NOTE — Telephone Encounter (Signed)
I called Mom to check on Lori Miles's ER visit yesterday. She said that [facility] was given Levaquin for UTI. I explained to Mom that illness and infection lowers seizure threshold, and that Lori Miles will likely continue to have breakthrough seizures when she is sick, and that increasing her Depakote is not indicated at this time.   Mom was also very concerned about increasing edema in Lori Miles's feet and ankles, and asked what to do for that. I told Mom that [name] needs to be seen by PCP or internal medicine for this problem. I recommended that Mom call for an appointment and in the interim, do ankle and calf measurements every day, write them down and take them with her to the PCP appointment. Mom agreed. TG

## 2018-07-24 NOTE — Assessment & Plan Note (Signed)
Currently without flare - prone to exacerbation   No changes today  Cont follow up with Dr. Sharyn Lull .   Plan  Patient Instructions  Continue current regimen Follow-up with Dr. Craige Cotta  in 4 months and As needed.

## 2018-07-24 NOTE — Telephone Encounter (Signed)
Thank you, noted.

## 2018-07-24 NOTE — Progress Notes (Signed)
@Patient  ID: Lori Miles, female    DOB: [birth year], 23 y.o.   MRN: [id number]  Chief Complaint  Patient presents with   Follow-up    Asthma    Referring provider: Myrlene Broker, *  HPI: 23 year old female never smoker that has a complicated medical history that includes cerebral palsy, mental insufficiency with severe behavioral issues and autism., She has a seizure disorder and underlying asthma and allergic rhinitis has been seen by multiple medical providers in our office and is followed by Dr. Lucie Leather with asthma and allergy. Sleep evaluation February 2019 for sleep disturbances suspicious for sleep apnea. However has had 3negative sleep studies for sleep apnea, see below . Sleep study positive for primary snoring    TEST/EVENTS :  Sleep study January 06, 2015 at [place] sleep study at [facility] AHI 0.4 (total sleep time 428 minutes)moderate to loud snoring Sleep study March 27, 2016 AHI 0.1 SaO2 low 90%, positive snoring Sleep study December 10, 2017 AHI 0.3, SaO2 low 93% (sleep time 413 minutes) positive snoring  A CPAP titration study was ordered June 2018 with known negative diagnostic sleep study with elimination of snoring on CPAP 10 cm H2O total sleep time 367 minutes with sleep efficiency 98%  07/24/2018 Follow up : Asthma, Snoring  Patient presents for a 56-month follow-up.  She is accompanied by her mother today. Several ER visit for various complaints. Seen yesterday for UTI , rx Levaquin. And  Asthma flare x 2 in April .  Chest x-ray in April with no acute process noted. Says she is feeling some better today .  Remains on pulmicort neb and perforomist Twice daily  , yupelri daily . Zyrtec , nasonex, and singulair daily .  Has good and bad days with asthma . No flare of cough or wheezing currently .   Recent request by her mother, a letter was written to her insurance provider requesting for CPAP at bedtime to help with the  snoring and sleep disturbances.  As above patient has had negative sleep studies however continued to have significant snoring and sleep disturbances.  A CPAP titration study that was done in June 2018 showed elimination of snoring and improved sleep efficiency on CPAP at 10 cm H2O.  A request has been made to her insurance provider to provide a CPAP at bedtime. Says she has not been sleeping much , very disrupted sleep . She likes to sleep on/off during daytime. Restless at night . This has been chronic .     Allergies  Allergen Reactions   Abilify [Aripiprazole] Swelling and Palpitations   Seroquel [Quetiapine Fumarate] Palpitations   Bactrim [Sulfamethoxazole-Trimethoprim] Other (See Comments)    Abdominal pain   Doxycycline Other (See Comments)    Stomach cramps   Amoxicillin-Pot Clavulanate Hives    Has patient had a PCN reaction causing immediate rash, facial/tongue/throat swelling, SOB or lightheadedness with hypotension: No Has patient had a PCN reaction causing severe rash involving mucus membranes or skin necrosis: No Has patient had a PCN reaction that required hospitalization: No Has patient had a PCN reaction occurring within the last 10 years: No If all of the above answers are "NO", then may proceed with Cephalosporin use.    Keppra [Levetiracetam] Other (See Comments)    Aggressive behavior     Immunization History  Administered Date(s) Administered   Pneumococcal Polysaccharide-23 08/06/2016   Tdap 03/03/2017    Past Medical History:  Diagnosis Date   Asthma  Autism    mom states no actual dx, but admits she has some symptoms   Bacteriuria    Chronic constipation    Cognitive developmental delay    Disruptive behavior disorder    Family history of adverse reaction to anesthesia    mother had itching after anesthesia one time   Frequency-urgency syndrome    Gait disorder    ORGANIC PER NERUOLGIST NOTE (DR HICKLING)   Generalized  convulsive epilepsy (HCC) NEUROLOGIST-  DR HICKLING   GERD (gastroesophageal reflux disease)    Gout    H/O sinus tachycardia    History of acute respiratory failure 02/14/2015   SECONDARY TO CAP   History of recurrent UTIs    Interstitial cystitis    Moderate intellectual disability    OSA (obstructive sleep apnea)    no machine yet    Partial epilepsy with impairment of consciousness (HCC)    FOLLOWED BY DR HICKLING   Pneumonia 2018 last time   PONV (postoperative nausea and vomiting)    Seizure (HCC)    most recent sz " at the end of summer"-last sz years ago per mother   Urinary incontinence     Tobacco History: Social History   Tobacco Use  Smoking Status Never Smoker  Smokeless Tobacco Never Used   Counseling given: Not Answered   Outpatient Medications Prior to Visit  Medication Sig Dispense Refill   acetaminophen (TYLENOL) 325 MG tablet Take 2 tablets (650 mg total) by mouth every 6 (six) hours as needed for mild pain or moderate pain. 60 tablet 0   albuterol (PROVENTIL) (2.5 MG/3ML) 0.083% nebulizer solution INHALE CONTENTS OF 1 VIAL IN NEBULIZER EVERY 4 TO 6 HOURS IF NEEDED FOR COUGH OR WHEEZING 150 mL 5   budesonide (PULMICORT) 1 MG/2ML nebulizer solution Take 2 mLs (1 mg total) by nebulization 4 (four) times daily. During upper respiratory infections and asthma flares for 1-2 weeks. 240 mL 1   cetirizine (ZYRTEC) 10 MG tablet TAKE 1 TABLET(10 MG) BY MOUTH DAILY 30 tablet 3   clotrimazole-betamethasone (LOTRISONE) cream Apply to affected area 2 times daily prn 45 g 0   DEPAKOTE 500 MG DR tablet TAKE 1 TABLET BY MOUTH IN THE MORNING AND 2 AT NIGHT (Patient taking differently: Take 500-1,000 mg by mouth as directed. TAKE 1 (500 MG) TABLET BY MOUTH IN THE MORNING AND 2 (1000 MG) tablet AT NIGHT) 90 tablet 5   famotidine (PEPCID) 20 MG tablet TAKE 1 TABLET BY MOUTH EVERY 12 HOURS. DISCONTINUE RANITIDINE 60 tablet 3   formoterol (PERFOROMIST) 20  MCG/2ML nebulizer solution Take 2 mLs (20 mcg total) by nebulization 2 (two) times daily. 120 mL 2   levofloxacin (LEVAQUIN) 750 MG tablet Take 1 tablet (750 mg total) by mouth daily for 4 days. 4 tablet 0   Levonorgestrel-Ethinyl Estradiol (SEASONIQUE) 0.15-0.03 &0.01 MG tablet Take 1 tablet by mouth daily. 3 Package 3   Melatonin 3 MG TABS Take 3 mg by mouth at bedtime as needed (for sleep).      mometasone (NASONEX) 50 MCG/ACT nasal spray Place 2 sprays into the nose daily. 17 g 5   montelukast (SINGULAIR) 10 MG tablet TAKE 1 TABLET(10 MG) BY MOUTH AT BEDTIME 30 tablet 3   Nebulizer MISC Adult mask with tubing. Use as directed with nebulizer device. 4 each 5   ondansetron (ZOFRAN ODT) 4 MG disintegrating tablet Take 1 tablet (4 mg total) by mouth every 8 (eight) hours as needed for nausea. 10 tablet 0  polyethylene glycol powder (GLYCOLAX/MIRALAX) powder Dissolve 17 grasm in at least 8 ounces water/juice and drink by mouth twice daily (Patient taking differently: Take 17 g by mouth daily as needed for mild constipation. ) 1020 g 3   predniSONE (STERAPRED UNI-PAK 21 TAB) 10 MG (21) TBPK tablet Take by mouth daily. Take as directed. 21 tablet 0   Revefenacin (YUPELRI) 175 MCG/3ML SOLN Inhale 1 vial into the lungs daily. 270 mL 2   senna (SENOKOT) 8.6 MG tablet Take 1 tablet by mouth at bedtime.      terconazole (TERAZOL 3) 0.8 % vaginal cream USE TOPICALLY IF NEEDED FOR YEAST     traZODone (DESYREL) 50 MG tablet Take 2 tablets (100 mg total) by mouth at bedtime. 62 tablet 5   No facility-administered medications prior to visit.      Review of Systems:   Constitutional:   No  weight loss, night sweats,  Fevers, chills, + fatigue, or  lassitude.  HEENT:   No headaches,  Difficulty swallowing,  Tooth/dental problems, or  Sore throat,                No sneezing, itching, ear ache,  +nasal congestion, post nasal drip,   CV:  No chest pain,  Orthopnea, PND, swelling in lower  extremities, anasarca, dizziness, palpitations, syncope.   GI  No heartburn, indigestion, abdominal pain, nausea, vomiting, diarrhea, change in bowel habits, loss of appetite, bloody stools.   Resp:    No chest wall deformity  Skin: no rash or lesions.  GU: no dysuria, change in color of urine, no urgency or frequency.  No flank pain, no hematuria   MS:  No joint pain or swelling.  No decreased range of motion.  No back pain.    Physical Exam  BP 110/62 (BP Location: Left Arm, Cuff Size: Normal)    Pulse (!) 111    Ht 4' 9.5" (1.461 m)    Wt 187 lb (84.8 kg)    SpO2 95%    BMI 39.77 kg/m   GEN: A/Ox3; pleasant , NAD, morbidly obese   HEENT:  [place]/AT,   NOSE-clear, THROAT-clear, no lesions, no postnasal drip or exudate noted. Class 2-3 MP airway   NECK:  Supple w/ fair ROM; no JVD; normal carotid impulses w/o bruits; no thyromegaly or nodules palpated; no lymphadenopathy.    RESP  Clear  P & A; w/o, wheezes/ rales/ or rhonchi. no accessory muscle use, no dullness to percussion  CARD:  RRR, no m/r/g, 1+ peripheral edema, pulses intact, no cyanosis or clubbing.  GI:   Soft & nt; nml bowel sounds; no organomegaly or masses detected.   Musco: Warm bil, no deformities or joint swelling noted.   Neuro: alert, no focal deficits noted.    Skin: Warm, no lesions or rashes    Lab Results:  CBC    Component Value Date/Time   WBC 12.0 (H) 07/23/2018 1715   RBC 3.78 (L) 07/23/2018 1715   HGB 11.6 (L) 07/23/2018 1715   HCT 36.0 07/23/2018 1715   PLT 204 07/23/2018 1715   MCV 95.2 07/23/2018 1715   MCH 30.7 07/23/2018 1715   MCHC 32.2 07/23/2018 1715   RDW 14.5 07/23/2018 1715   LYMPHSABS 4.6 (H) 06/06/2018 2310   MONOABS 1.3 (H) 06/06/2018 2310   EOSABS 0.0 06/06/2018 2310   BASOSABS 0.1 06/06/2018 2310    BMET    Component Value Date/Time   NA 139 07/23/2018 1715   K 4.1 07/23/2018  1715   CL 106 07/23/2018 1715   CO2 24 07/23/2018 1715   GLUCOSE 172 (H) 07/23/2018  1715   BUN 6 07/23/2018 1715   CREATININE 0.90 07/23/2018 1715   CALCIUM 9.1 07/23/2018 1715   GFRNONAA >60 07/23/2018 1715   GFRAA >60 07/23/2018 1715    BNP    Component Value Date/Time   BNP 16.1 10/24/2014 0745    ProBNP    Component Value Date/Time   PROBNP 9.0 05/05/2015 1604    Imaging: Dg Chest 2 View  Result Date: 07/02/2018 CLINICAL DATA:  Cough and wheezing EXAM: CHEST - 2 VIEW COMPARISON:  06/06/2018 FINDINGS: Cardiac shadows within normal limits. The lungs are hypoinflated. No focal infiltrate or effusion is seen. Mild crowding of the vascular markings is noted related to the poor inspiratory effort. No bony abnormality is seen. IMPRESSION: No acute abnormality noted. Poor inspiratory effort. Electronically Signed   By: Alcide Clever M.D.   On: 07/02/2018 19:26      No flowsheet data found.  No results found for: NITRICOXIDE      Assessment & Plan:   Asthma Currently without flare - prone to exacerbation   No changes today  Cont follow up with Dr. Sharyn Lull .   Plan  Patient Instructions  Continue current regimen Follow-up with Dr. Craige Cotta  in 4 months and As needed.      Chronic rhinitis Cont on trigger prevention   Plan  Patient Instructions  Continue current regimen Follow-up with Dr. Craige Cotta  in 4 months and As needed.      Excessive daytime sleepiness Sleep disturbances, Snoring , daytime sleepiness  No OSA on sleep study however improved sleep hygiene and elimination of snoring with CPAP  Letter to insurance provider for appeal for nocturnal CPAP .       Rubye Oaks, NP 07/24/2018

## 2018-07-24 NOTE — Assessment & Plan Note (Signed)
Cont on trigger prevention   Plan  Patient Instructions  Continue current regimen Follow-up with Dr. Craige Cotta  in 4 months and As needed.

## 2018-07-24 NOTE — Assessment & Plan Note (Signed)
Sleep disturbances, Snoring , daytime sleepiness  No OSA on sleep study however improved sleep hygiene and elimination of snoring with CPAP  Letter to insurance provider for appeal for nocturnal CPAP .

## 2018-07-24 NOTE — Patient Instructions (Signed)
Continue current regimen Follow-up with Dr. Craige Cotta  in 4 months and As needed.

## 2018-07-27 DIAGNOSIS — G809 Cerebral palsy, unspecified: Secondary | ICD-10-CM | POA: Diagnosis not present

## 2018-07-27 DIAGNOSIS — J4520 Mild intermittent asthma, uncomplicated: Secondary | ICD-10-CM | POA: Diagnosis not present

## 2018-07-28 DIAGNOSIS — J449 Chronic obstructive pulmonary disease, unspecified: Secondary | ICD-10-CM | POA: Diagnosis not present

## 2018-07-28 NOTE — Telephone Encounter (Signed)
[name] is calling in regards to Cpap. Per pt mother, there were notes on original paperwork that was not correct. [employer] is requesting order for Cpap be edited and returned.  [employer] fax number-[phone] Appeals Dept Cb is [phone]

## 2018-07-28 NOTE — Telephone Encounter (Signed)
Called and spoke with Whitney with [facility] insurance at phone [phone] She advised that Thana Ates was needing CPT code for PA for cpap machine Advised Whitney there is no order for CPAP machine on this patient Advised that there have been sleep studies confirming patient doesn't qualify for OSA doesn't need cpap  Whitney placed notes in the system that they are unable to provide PA DME at this time has not placed PA for CPAP at this time because there is no order for a CPAP for this pt Whitney advised that insurance nor DME can process an order for CPAP upon request of pt's mother She also advised that this request will be cancelled and she made notes for this call Reference number for call is [phone] Nothing further is needed at this time.

## 2018-07-28 NOTE — Telephone Encounter (Signed)
Lori Miles, [facility], is calling in regards to PA for Cpap. Per Lori Miles, please call to start a new PA for cpap however if there is already one that has been denied please call the appeals dept.   PA dept [phone] Appeals Dept- [phone] Provider Line for Questions- [phone]

## 2018-07-28 NOTE — Telephone Encounter (Signed)
Spoke with Lori Miles at [facility] and she states we need to have a CPT code to start a PA. There is no PA on file. Pcc's do you know the CPT code for a cpap machine? [phone] option 3

## 2018-07-28 NOTE — Telephone Encounter (Signed)
CPAP's are all billed thru DME company so we wouldn't have that info.  Whoever she got her CPAP thru should be the one's to do the appeal.  They would have CPT codes.

## 2018-08-03 ENCOUNTER — Other Ambulatory Visit: Payer: Self-pay

## 2018-08-03 ENCOUNTER — Ambulatory Visit: Payer: Medicare Other | Attending: Family | Admitting: Physical Therapy

## 2018-08-03 DIAGNOSIS — M6281 Muscle weakness (generalized): Secondary | ICD-10-CM

## 2018-08-03 DIAGNOSIS — R2681 Unsteadiness on feet: Secondary | ICD-10-CM

## 2018-08-03 DIAGNOSIS — R2689 Other abnormalities of gait and mobility: Secondary | ICD-10-CM | POA: Diagnosis not present

## 2018-08-03 NOTE — Therapy (Signed)
[facility] [address] [place], [place], [address] Phone: [phone]   Fax:  [phone]  Physical Therapy Treatment  Patient Details  Name: Lori Miles MRN: [id number] Date of Birth: [birth year] Referring Provider (PT): Rockwell Germany, NP   CLINIC OPERATION CHANGES: [facility] is operating at a low capacity due to COVID-19.  The patient was brought into the clinic for evaluation and/or treatment following universal masking by staff, social distancing, and <10 people in the clinic.  The patient's COVID risk of complications score is 3.   Encounter Date: 08/03/2018  PT End of Session - 08/04/18 1339    Visit Number  7    Number of Visits  11    Date for PT Re-Evaluation  09/15/18    Authorization Type  [employer] Medicare    Authorization Time Period  04-14-18 - 07-11-18; 08-03-18 - 10-16-18    PT Start Time  0940   pt arrived early for 11:45 appt due to transportation   PT Stop Time  1015    PT Time Calculation (min)  35 min    Activity Tolerance  Patient tolerated treatment well    Behavior During Therapy  [facility] for tasks assessed/performed       Past Medical History:  Diagnosis Date  . Asthma   . Autism    mom states no actual dx, but admits she has some symptoms  . Bacteriuria   . Chronic constipation   . Cognitive developmental delay   . Disruptive behavior disorder   . Family history of adverse reaction to anesthesia    mother had itching after anesthesia one time  . Frequency-urgency syndrome   . Gait disorder    ORGANIC PER NERUOLGIST NOTE (DR HICKLING)  . Generalized convulsive epilepsy ([place]) NEUROLOGIST-  DR HICKLING  . GERD (gastroesophageal reflux disease)   . Gout   . H/O sinus tachycardia   . History of acute respiratory failure 02/14/2015   SECONDARY TO CAP  . History of recurrent UTIs   . Interstitial cystitis   . Moderate intellectual disability   . OSA  (obstructive sleep apnea)    no machine yet   . Partial epilepsy with impairment of consciousness ([place])    FOLLOWED BY DR HICKLING  . Pneumonia 2018 last time  . PONV (postoperative nausea and vomiting)   . Seizure ([place])    most recent sz " at the end of summer"-last sz years ago per mother  . Urinary incontinence     Past Surgical History:  Procedure Laterality Date  . CYSTOSCOPY N/A 06/03/2017   Procedure: CYSTOSCOPY WITH EXAM UNDER ANESTHESIA;  Surgeon: Festus Aloe, MD;  Location: [facility];  Service: Urology;  Laterality: N/A;  . CYSTOSCOPY WITH HYDRODISTENSION AND BIOPSY  04/14/2012   Procedure: CYSTOSCOPY/BIOPSY/HYDRODISTENSION;  Surgeon: Hanley Ben, MD;  Location: [facility];  Service: Urology;;  instillation of marcaine and pyridium  . EUA/  PAP SMEAR/  BREAST EXAM  03-12-2016   dr Ihor Dow [facility]  . EUA/ VAGINOSCOPY/ COLPOSCOPY FO THE HYMENAL RING  10-04-2009    dr Delsa Sale  [facility]  . TONSILLECTOMY      There were no vitals filed for this visit.  Subjective Assessment - 08/04/18 1330    Subjective  [name] states pt had seizure last weekend; states pt's feet and ankles are very swollen - states she does not know what is causing her feet to swell    Patient is  accompained by:  Family member   mother   Pertinent History  Cognitive developmentally delayed:  recurrent falls:  weakness:  tremor, unspecified:  generalized convulsive epilepsy;  autism spectrum disorder    Patient Stated Goals  improve walking and try to stop the tremors    Currently in Pain?  No/denies                    [facility] Adult PT Treatment/Exercise - 08/04/18 0001      Transfers   Transfers  Sit to Stand    Number of Reps  Other reps (comment)   5 reps - no UE support used     Ambulation/Gait   Ambulation/Gait  Yes    Ambulation/Gait Assistance  5: Supervision    Ambulation Distance (Feet)  230 Feet    Assistive device  1 person hand  held assist;None    Gait Pattern  Step-through pattern   LLE internally rotates with foot flat contact at stance   Ambulation Surface  Level;Indoor    Gait velocity  10.91 = 3.01 ft/sec    Stairs  Yes    Stairs Assistance  5: Supervision    Stair Management Technique  Two rails;Alternating pattern;Step to pattern   step to pattern with descension   Number of Stairs  4    Height of Stairs  6      Neuro Re-ed    Neuro Re-ed Details   pt performed agility ladder on floor activity to facilitate increased step length and for improved SLS on each leg - min hand held assist given for safety with this activity       Knee/Hip Exercises: Aerobic   Recumbent Bike  SciFit level 2.5 x 5" with UE's and LE's      Knee/Hip Exercises: Standing   Heel Raises  Both;1 set;10 reps   with bil. UE support on rails at steps   Hip Flexion  AROM;Both;1 set;10 reps    Hip Abduction  AROM;Both;1 set;10 reps    Hip Extension  AROM;Both;1 set;10 reps      TUG score 13.10 secs without use of assistive device        PT Education - 08/04/18 1337    Education Details  mother requested that pt's lower legs be measured in girth in order for correct size for compression stockings; RLE 10.5";  LLE 10.25" measured at distal leg - just above sock crease on each leg    advised mother to follow up with pt's PCP regarding edema in bil. LE's   Person(s) Educated  Patient;Parent(s)    Methods  Explanation    Comprehension  Verbalized understanding          PT Long Term Goals - 08/04/18 1341      PT LONG TERM GOAL #1   Title  Pt. will amb. 500' on flat, even surface without LOB.      Baseline  230' on 08-03-18 with hand held assist - pt able to amb. without assist    Time  4    Period  Weeks    Status  On-going    Target Date  09/15/18      PT LONG TERM GOAL #2   Title  Pt will perform at least 10" on recumbent bike nonstop for improved endurance/activity tolerance.    Baseline  6 1/2" on 05-25-18;pt  performed 5" on 08-03-18    Time  4    Period  Weeks  Status  On-going    Target Date  09/15/18      PT LONG TERM GOAL #3   Title  Mother will verbalize understanding of HEP to assist with LE strengthening.      Baseline  met 05-25-18    Status  Achieved      PT LONG TERM GOAL #4   Title  Perform sit to stand 10 reps without UE support from mat to demo increased bil. LE strength.    Baseline  5 reps performed on 08-03-18    Time  4    Period  Weeks    Status  New    Target Date  09/15/18            Plan - 08/04/18 1348    Clinical Impression Statement  Pt has not been seen in clinic since 06-02-18 due to Covid 19 exposure risk; pt returns for follow up; pt has not LTG #3; LTG's #1 and 2 improved since time of initial eval but not fully met per stated goal.  Mother reports pt is now having edema in bil. LE's which has worsened since time of previous PT session on 06-02-18.      PT Treatment/Interventions  ADLs/Self Care Home Management;DME Instruction;Gait training;Stair training;Orthotic Fit/Training;Patient/family education;Neuromuscular re-education;Balance training;Therapeutic exercise;Therapeutic activities    PT Next Visit Plan  cont LE strengthening; balance and gait training    Consulted and Agree with Plan of Care  Patient       Patient will benefit from skilled therapeutic intervention in order to improve the following deficits and impairments:  Abnormal gait, Decreased balance, Decreased strength, Decreased activity tolerance, Decreased endurance, Decreased safety awareness, Pain, Decreased coordination  Visit Diagnosis: Other abnormalities of gait and mobility - Plan: PT plan of care cert/re-cert  Unsteadiness on feet - Plan: PT plan of care cert/re-cert  Muscle weakness (generalized) - Plan: PT plan of care cert/re-cert     Problem List Patient Active Problem List   Diagnosis Date Noted  . Glucosuria 06/01/2018  . 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  Not well controlled moderate persistent asthma 04/29/2018  . Gastroesophageal reflux disease 04/29/2018  . Recurrent infections 04/29/2018  . Dysuria 02/20/2018  . Chronic rhinitis 04/03/2017  . Recurrent falls 03/21/2017  . Weakness 03/21/2017  . Venous insufficiency 03/21/2017  . Excessive daytime sleepiness 03/05/2016  . 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 Cough variant asthma vs UACS/ vcd  03/15/2015  . Autism spectrum disorder 02/14/2015  . Nausea with vomiting 02/14/2015  . Obesity, morbid ([place]) 12/13/2014  . 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 Acanthosis nigricans 11/14/2014  . Toeing-in 08/29/2014  . Generalized convulsive epilepsy ([place]) 09/28/2012  . Partial epilepsy with impairment of consciousness ([place]) 09/28/2012  . 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 Recurrent urinary tract infection 08/01/2011  . Asthma 08/01/2011  . Chronic constipation 08/01/2011  . Contraception 08/01/2011    Alda Lea, PT 08/04/2018, 1:57 PM  [facility] [address] [place], [place], [address] Phone: [phone]   Fax:  [phone]  Name: Lori Miles MRN: [id number] Date of Birth: [birth year]

## 2018-08-04 ENCOUNTER — Telehealth: Payer: Self-pay | Admitting: Pulmonary Disease

## 2018-08-04 NOTE — Telephone Encounter (Signed)
Returned call to patient's mother. She wants to speak with [facility] about another matter. Made aware Harvin Hazel not working triage today.   FYI:  She says [employer] called her again on late yesterday evening wanting to do another 3 way call. Mother states her daughter's throat closed up again and she is sleeping during PT. Made aware appeal letter written and mailed on 5/12.

## 2018-08-05 ENCOUNTER — Telehealth: Payer: Self-pay | Admitting: Pulmonary Disease

## 2018-08-05 DIAGNOSIS — K5900 Constipation, unspecified: Secondary | ICD-10-CM | POA: Diagnosis not present

## 2018-08-05 DIAGNOSIS — N390 Urinary tract infection, site not specified: Secondary | ICD-10-CM | POA: Diagnosis not present

## 2018-08-05 NOTE — Telephone Encounter (Signed)
Called and spoke with pt's mother regarding patient is in need of cpap machine [name] advised that pt is falling asleep during the day during daily tasks, therapy, and eating. Pt is gasping for air, stops breathing at times and choking during the night. The pt is up all night long And sleeps all day during the daytime [name] advised that Medicare did receive the letter form TP The insurance is not able to place an order for the cpap as explained to pt's mother [name] requested ov with TP over the phone to discuss this matter more in depth Scheduled televisit with patient with TP for 08/13/18 at 1030am Nothing further needed

## 2018-08-05 NOTE — Telephone Encounter (Signed)
LVM for patient's mother to return call back.X1

## 2018-08-06 ENCOUNTER — Telehealth (INDEPENDENT_AMBULATORY_CARE_PROVIDER_SITE_OTHER): Payer: Self-pay | Admitting: Family

## 2018-08-06 NOTE — Telephone Encounter (Signed)
It appears that pt has had multiple sleep studies all being negative. I do not believe additional sleep studies are indicated at this time. I have discussed this with TP via telephone. She agrees.   If insurance has received TPs letter what is the status of that?   Has the patient received a denial or they continuing to review?  I do not think we need to have additional sleep studies at this time.  Even if I did order a sleep study this still would be completed for 6 to 8 weeks due to the backlog of test as well as COVID-19 restrictions.  I recommend that the patient keep the scheduled follow-up with TP and this can be discussed in more detail.  Will route this message to TP and Dr. Craige Cotta as [name] Childes.   [name] Headland FNP

## 2018-08-06 NOTE — Telephone Encounter (Signed)
Agree that she should keep appointment as scheduled to discuss in more detail.

## 2018-08-06 NOTE — Telephone Encounter (Signed)
Called and spoke with pt's [name]. Per [name], she is wanting to know if we could go ahead and place an order for pt to have sleep study performed as she stated she was pretty sure insurance would not deny the sleep study. I did see encounter from 5/20 where [facility] called and spoke with [name] regarding pt and the cPAP machine and saw where Medicare did receive the letter from TP but it states that insurance is not able to place order for CPAP which was explained to Joyce from [place]. Due to this, [name] had requested to have a televisit with TP which was set up 5/28 at 10:30 with TP.  Now, [name] is wanting to know if pt could just go ahead and have an order placed to have the sleep study redone instead of having televisit with TP.    Due to [name] being out of the office, sending to APP of the day. [name], please advise on this if you are okay for [name] to go ahead and order sleep study for pt or if you believe that we should keep televisit as currently scheduled for TP to discuss all info with pt's [name] at the time of the visit?

## 2018-08-06 NOTE — Telephone Encounter (Signed)
I received a call from [facility] with [employer] regarding Lori Miles. She said that Lori Miles asked her to call me about getting a sleep study performed for Lori Miles. She said that Lori Miles had submitted letter for CPAP but that it was denied because all prior sleep studies did not fit the criteria. She said that Lori Miles wanted me to order a sleep study since Lori Miles Miles has refused to order more studies. I told her that I did not see the benefit in ordering it given the information provided but that I would be happy to look into the situation. I talked to Lori Miles and she said that Lori Miles is falling asleep during the day, even during activities such as PT. Lori Miles said that Lori Miles continues to snore loudly and have episodes of coughing and gasping for breath at night, and is insistent upon sleep study and/or obtaining CPAP for her. I told Lori Miles that I do not have a solution at this time and that I will call her back. TG

## 2018-08-07 NOTE — Telephone Encounter (Signed)
I called Mom and attempted to review and explain about my conversation with [employer] [facility]) yesterday. She argued with me and continued to ask for a sleep study to be ordered so Deira could get CPAP. I asked her to bring [facility] in on May 26th at 8AM so that we can talk in person. Mom agreed to this plan. TG

## 2018-08-07 NOTE — Telephone Encounter (Signed)
Spoke with [name] and notified of response per Dr Craige Cotta

## 2018-08-11 ENCOUNTER — Ambulatory Visit (INDEPENDENT_AMBULATORY_CARE_PROVIDER_SITE_OTHER): Payer: Self-pay | Admitting: Family

## 2018-08-12 ENCOUNTER — Telehealth: Payer: Self-pay | Admitting: *Deleted

## 2018-08-12 NOTE — Telephone Encounter (Signed)
Spoke with pt mom, Lori Miles, she states pt is to have Urodynamic testing done and would like to know if we could help in coordination for procedure.  Lori Miles states pt cannot travel to [place] due to seizure activity and that provider can come to town to preform. Lori Miles made aware that our office does not preform this procedure therefore cannot coordinate. She was advised to contact Urology office and ask for them to coordinate tx at a [facility] facility if that provider has privileges here.  Lori Miles states understanding. Made her aware that Lori Miles is due for AEX in November.

## 2018-08-13 ENCOUNTER — Telehealth: Payer: Self-pay | Admitting: Adult Health

## 2018-08-13 ENCOUNTER — Encounter: Payer: Self-pay | Admitting: Adult Health

## 2018-08-13 ENCOUNTER — Ambulatory Visit (INDEPENDENT_AMBULATORY_CARE_PROVIDER_SITE_OTHER): Payer: Medicare Other | Admitting: Adult Health

## 2018-08-13 ENCOUNTER — Other Ambulatory Visit: Payer: Self-pay

## 2018-08-13 DIAGNOSIS — R0683 Snoring: Secondary | ICD-10-CM

## 2018-08-13 DIAGNOSIS — G4700 Insomnia, unspecified: Secondary | ICD-10-CM

## 2018-08-13 DIAGNOSIS — G4719 Other hypersomnia: Secondary | ICD-10-CM | POA: Diagnosis not present

## 2018-08-13 NOTE — Progress Notes (Signed)
Virtual Visit via Telephone Note  I connected with Lori Miles on 08/13/18 at 10:30 AM EDT by telephone and verified that I am speaking with the correct person using two identifiers.  Location: Patient: Home with Mother (Both present for visit )  Provider: Office    I discussed the limitations, risks, security and privacy concerns of performing an evaluation and management service by telephone and the availability of in person appointments. I also discussed with the patient that there may be a patient responsible charge related to this service. The patient expressed understanding and agreed to proceed.   History of Present Illness: Today tele-visit is a follow up for snoring and daytime sleepiness   23 year old female never smoker that has a complicated medical history that includes cerebral palsy, mental insufficiency with severe behavioral issues and autism., She has aseizure disorderandunderlying asthma and allergic rhinitis has been seen by multiple medical providers in our office and is followed by Dr. Lucie Leather with asthma and allergy. Sleep evaluation February 2019 for sleep disturbances suspicious for sleep apnea. However has had 3negative sleep studies for sleep apnea, see below . Sleep study positive for primary snoring She had a CPAP titration study that was ordered in June 2018 that was negative diagnostic sleep study but showed elimination of her snoring and 98% sleep efficiency on CPAP 10 cm H2O.  Patient has ongoing snoring, significant sleep disturbances, insomnia and daytime sleepiness with frequent falling asleep during the daytime.  Patient's mother gives most of the history.  Patient has a nighttime nurse that reports  patient does not sleep much at night at all.  During the daytime patient patient falls asleep frequently.  Is unable to participate in activities.  Patient is on trazodone 50 mg 2 at bedtime and melatonin.  Patient's mom says that patient goes to bed at  night at 7:00. Pm . And that is when she gives her medications trazodone and melatonin. Patient's mom is requested a CPAP machine multiple times but due to her 3 negative  sleep studies has been declined by insurance.  A letter was written recently requested a CPAP machine due to previous CPAP titration study that showed elimination of snoring and significantly improved sleep efficiency.  This is currently pending.       Observations/Objective: Sleep study January 06, 2015 at [place] sleep study at [facility] AHI 0.4 (total sleep time 428 minutes)moderate to loud snoring Sleep study March 27, 2016 AHI 0.1 SaO2 low 90%, positive snoring Sleep study December 10, 2017 AHI 0.3, SaO2 low 93% (sleep time 413 minutes) positive snoring  A CPAP titration study was ordered June 2018 with known negative diagnostic sleep study with elimination of snoring on CPAP 10 cm H2O total sleep time 367 minutes with sleep efficiency 98%  Assessment and Plan: Daytime hypersomnolence, nocturnal snoring and insomnia-patient has had 3 sleep studies that have been negative. CPAP titration study did show improved sleep efficiency and elimination of snoring.  Do feel that she would benefit from a CPAP at nighttime.  We will also adjust her nighttime medication regimen for trazodone and melatonin to later in the night around 10 PM to see if this will help encouraged on a healthy sleep regimen and elimination of daytime sleeping, naps.  We will set patient up for a multi-latency sleep test (MLST) to check for possible underlying narcolepsy.  Plan  Patient Instructions  Move to Trazodone 50mg  2 At bedtime  At 10:00 pm .  Move Melatonin At bedtime  At 10:00 pm  Set her up for a MLST .  Stay active during daytime.  Follow up with Dr. Craige CottaSood  In 2-3 months and As needed   Please contact office for sooner follow up if symptoms do not improve or worsen or seek emergency care       Discussed  preprocedural COVID testing   Follow Up Instructions: Follow up with Dr. Craige CottaSood  In 2 months and As needed   Please contact office for sooner follow up if symptoms do not improve or worsen or seek emergency care    I discussed the assessment and treatment plan with the patient. The patient was provided an opportunity to ask questions and all were answered. The patient agreed with the plan and demonstrated an understanding of the instructions.   The patient was advised to call back or seek an in-person evaluation if the symptoms worsen or if the condition fails to improve as anticipated.  I provided 25 minutes of non-face-to-face time during this encounter.   Rubye Oaksammy , NP

## 2018-08-13 NOTE — Addendum Note (Signed)
Addended by: Edwina Barth I on: 08/13/2018 11:23 AM   Modules accepted: Orders

## 2018-08-13 NOTE — Patient Instructions (Addendum)
Move to Trazodone 50mg  2 At bedtime  At 10:00 pm .  Move Melatonin At bedtime  At 10:00 pm  Set her up for a MLST .  Stay active during daytime.  Follow up with Dr. Craige Cotta  In 2-3 months and As needed   Please contact office for sooner follow up if symptoms do not improve or worsen or seek emergency care

## 2018-08-13 NOTE — Addendum Note (Signed)
Addended by: Edwina Barth I on: 08/13/2018 02:04 PM   Modules accepted: Orders

## 2018-08-13 NOTE — Telephone Encounter (Signed)
Patient had televisit today with [place] NP Patient [name] unsure if [facility] received the appeal letter for sleep study [employer] - after 3 transfers and on the phone was informed that will need to call [phone] opt [employer]  This may be null and void now because a Multiple Sleep Latency Test was order at the visit today and this must be done with an NPSG  Will wait to see if the NPSG is covered with the MSLT

## 2018-08-14 NOTE — Telephone Encounter (Signed)
Left message for patients mother to call back. ° °

## 2018-08-17 NOTE — Telephone Encounter (Signed)
Spoke with patient's mother Patient is scheduled for a NPSG and MSLT on 09/22/2018 and 09/23/2018 respectively Patient's mother is aware  Nothing further needed; will sign off

## 2018-08-20 ENCOUNTER — Emergency Department (HOSPITAL_COMMUNITY)
Admission: EM | Admit: 2018-08-20 | Discharge: 2018-08-21 | Disposition: A | Discharge status: Home / Self Care | Payer: Medicare Other | Attending: Emergency Medicine | Admitting: Emergency Medicine

## 2018-08-20 ENCOUNTER — Telehealth: Payer: Self-pay | Admitting: Adult Health

## 2018-08-20 ENCOUNTER — Emergency Department (HOSPITAL_COMMUNITY): Payer: Medicare Other

## 2018-08-20 ENCOUNTER — Other Ambulatory Visit: Payer: Self-pay

## 2018-08-20 ENCOUNTER — Encounter (HOSPITAL_COMMUNITY): Payer: Self-pay | Admitting: *Deleted

## 2018-08-20 DIAGNOSIS — R350 Frequency of micturition: Secondary | ICD-10-CM | POA: Insufficient documentation

## 2018-08-20 DIAGNOSIS — N390 Urinary tract infection, site not specified: Secondary | ICD-10-CM | POA: Diagnosis not present

## 2018-08-20 DIAGNOSIS — Z87440 Personal history of urinary (tract) infections: Secondary | ICD-10-CM | POA: Insufficient documentation

## 2018-08-20 DIAGNOSIS — G4733 Obstructive sleep apnea (adult) (pediatric): Secondary | ICD-10-CM | POA: Diagnosis not present

## 2018-08-20 DIAGNOSIS — Z79899 Other long term (current) drug therapy: Secondary | ICD-10-CM | POA: Insufficient documentation

## 2018-08-20 DIAGNOSIS — R062 Wheezing: Secondary | ICD-10-CM | POA: Diagnosis not present

## 2018-08-20 DIAGNOSIS — R300 Dysuria: Secondary | ICD-10-CM | POA: Diagnosis present

## 2018-08-20 DIAGNOSIS — J029 Acute pharyngitis, unspecified: Secondary | ICD-10-CM | POA: Diagnosis not present

## 2018-08-20 LAB — URINALYSIS, ROUTINE W REFLEX MICROSCOPIC
Bacteria, UA: NONE SEEN
Bilirubin Urine: NEGATIVE
Glucose, UA: 150 mg/dL — AB
Hgb urine dipstick: NEGATIVE
Ketones, ur: 5 mg/dL — AB
Nitrite: NEGATIVE
Protein, ur: 100 mg/dL — AB
Specific Gravity, Urine: 1.033 — ABNORMAL HIGH (ref 1.005–1.030)
WBC, UA: >50 WBC/hpf — ABNORMAL HIGH (ref 0–5)
pH: 5 (ref 5.0–8.0)

## 2018-08-20 MED ORDER — ALBUTEROL SULFATE (2.5 MG/3ML) 0.083% IN NEBU: 5.0000 mg | INHALATION_SOLUTION | Freq: Once | RESPIRATORY_TRACT | Status: DC

## 2018-08-20 NOTE — Telephone Encounter (Signed)
Pt's mom is calling back [phone]

## 2018-08-20 NOTE — Telephone Encounter (Signed)
Call returned to patient's mother, she states she was returning a phone call to her and she did not want to speak to anyone else besides [place]. States she is willing to wait until Monday.

## 2018-08-20 NOTE — Telephone Encounter (Signed)
Attempted to return call to [place].  No answer and voicemail is full - unable to leave message.

## 2018-08-20 NOTE — Telephone Encounter (Signed)
Spoke with pt's mom and she would like to speak to [place]. I attempted to get more information but she did not want to discuss the matter with me. Please advise.

## 2018-08-20 NOTE — ED Triage Notes (Addendum)
[name] reports pt with recurrent UTI, recently was treated with Augmentin x 10 days with no relief.  Pt is still having "orange" urine and is incontinent with urine.  She states pt is normally prescribed 14 days worth of abx but they only gave her 10 days worth this time.  She called her doctor's office today and was instructed to bring pt's urine to the office tomorrow but she did not want to wait because pt is also wheezing.  She has given the pt her inhaler without relief.  Pt has hx of PNA in the past also.

## 2018-08-20 NOTE — Telephone Encounter (Signed)
Thank you for letting me know.  My current duty does not have phone access and I am off tomorrow, will return on Monday.  It may be more helpful for her to at least let someone know what she needs so that this can go ahead and be worked on until I return to the office.

## 2018-08-21 DIAGNOSIS — N390 Urinary tract infection, site not specified: Secondary | ICD-10-CM | POA: Diagnosis not present

## 2018-08-21 MED ORDER — LEVOFLOXACIN 750 MG PO TABS: 750.0000 mg | ORAL_TABLET | Freq: Every day | ORAL | 0 refills | Status: DC

## 2018-08-21 MED ORDER — LEVOFLOXACIN 750 MG PO TABS
750.0000 mg | ORAL_TABLET | Freq: Once | ORAL | Status: AC
  Administered 2018-08-21: 750 mg via ORAL
  Filled 2018-08-21: qty 1

## 2018-08-21 NOTE — ED Notes (Signed)
Patient verbalizes understanding of discharge instructions. Opportunity for questioning and answers were provided. Armband removed by staff, pt discharged from ED ambulatory.   

## 2018-08-21 NOTE — ED Provider Notes (Addendum)
[facility]   CSN: [id number] Arrival date & time: 08/20/18  2049    History   Chief Complaint Chief Complaint  Patient presents with  . Recurrent UTI  . Wheezing    HPI Lori Miles is a 23 y.o. female.     Patient presents to the emergency department with a chief complaint of dysuria and urinary frequency.  She is accompanied by her mother, who reports that the symptoms have been worsening.  She has been taking Augmentin, but has not been having any relief.  She has frequent and recurrent urinary tract infections.  She also has Keflex at home, but states that this does not work.  Mother reports that Levaquin seems to be the only thing that works for her.  She also notes that she had some wheezing earlier, and was concerned about developing pneumonia.  She gave a breathing treatment with some relief.  Denies any fevers.  States that she has been less active than normal.  The history is provided by the patient. No language interpreter was used.    Past Medical History:  Diagnosis Date  . Asthma   . Autism    mom states no actual dx, but admits she has some symptoms  . Bacteriuria   . Chronic constipation   . Cognitive developmental delay   . Disruptive behavior disorder   . Family history of adverse reaction to anesthesia    mother had itching after anesthesia one time  . Frequency-urgency syndrome   . Gait disorder    ORGANIC PER NERUOLGIST NOTE (DR HICKLING)  . 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 GERD (gastroesophageal reflux disease)   . Gout   . H/O sinus tachycardia   . History of acute respiratory failure 02/14/2015   SECONDARY TO CAP  . History of recurrent UTIs   . Interstitial cystitis   . Moderate intellectual disability   . OSA (obstructive sleep apnea)    no machine yet   . Partial epilepsy with impairment of consciousness (HCC)    FOLLOWED BY DR HICKLING  .  Pneumonia 2018 last time  . PONV (postoperative nausea and vomiting)   . Seizure (HCC)    most recent sz " at the end of summer"-last sz years ago per mother  . Urinary incontinence     Patient Active Problem List   Diagnosis Date Noted  . Glucosuria 06/01/2018  . 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 Not well controlled moderate persistent asthma 04/29/2018  . Gastroesophageal reflux disease 04/29/2018  . Recurrent infections 04/29/2018  . Dysuria 02/20/2018  . Chronic rhinitis 04/03/2017  . Recurrent falls 03/21/2017  . Weakness 03/21/2017  . Venous insufficiency 03/21/2017  . Excessive daytime sleepiness 03/05/2016  . 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 Cough variant asthma vs UACS/ vcd  03/15/2015  . Autism spectrum disorder 02/14/2015  . Nausea with vomiting 02/14/2015  . Obesity, morbid (HCC) 12/13/2014  . 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 Acanthosis nigricans 11/14/2014  . Toeing-in 08/29/2014  . 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 Recurrent urinary tract infection 08/01/2011  . Asthma 08/01/2011  . Chronic constipation 08/01/2011  . Contraception 08/01/2011    Past Surgical History:  Procedure Laterality Date  . CYSTOSCOPY N/A 06/03/2017  Procedure: CYSTOSCOPY WITH EXAM UNDER ANESTHESIA;  Surgeon: Jerilee Field, MD;  [facility];  Service: Urology;  Laterality: N/A;  . CYSTOSCOPY WITH HYDRODISTENSION AND BIOPSY  04/14/2012   Procedure: CYSTOSCOPY/BIOPSY/HYDRODISTENSION;  Surgeon: Lindaann Slough, MD;  Location: [facility];  Service: Urology;;  instillation of marcaine and pyridium  . EUA/  PAP SMEAR/  BREAST EXAM  03-12-2016   dr Erin Fulling [facility]  . EUA/ VAGINOSCOPY/  COLPOSCOPY FO THE HYMENAL RING  10-04-2009    dr Tamela Oddi  [facility]  . TONSILLECTOMY       OB History   No obstetric history on file.      Home Medications    Prior to Admission medications   Medication Sig Start Date End Date Taking? Authorizing Provider  acetaminophen (TYLENOL) 325 MG tablet Take 2 tablets (650 mg total) by mouth every 6 (six) hours as needed for mild pain or moderate pain. 02/27/17   Everlene Farrier, PA-C  albuterol (PROVENTIL) (2.5 MG/3ML) 0.083% nebulizer solution INHALE CONTENTS OF 1 VIAL IN NEBULIZER EVERY 4 TO 6 HOURS IF NEEDED FOR COUGH OR WHEEZING 03/17/18   Kozlow, Alvira Philips, MD  budesonide (PULMICORT) 1 MG/2ML nebulizer solution Take 2 mLs (1 mg total) by nebulization 4 (four) times daily. During upper respiratory infections and asthma flares for 1-2 weeks. 06/11/18   Kozlow, Alvira Philips, MD  cetirizine (ZYRTEC) 10 MG tablet TAKE 1 TABLET(10 MG) BY MOUTH DAILY 07/08/18   Kozlow, Alvira Philips, MD  clotrimazole-betamethasone (LOTRISONE) cream Apply to affected area 2 times daily prn 06/29/18   Elvina Sidle, MD  DEPAKOTE 500 MG DR tablet TAKE 1 TABLET BY MOUTH IN THE MORNING AND 2 AT NIGHT Patient taking differently: Take 500-1,000 mg by mouth as directed. TAKE 1 (500 MG) TABLET BY MOUTH IN THE MORNING AND 2 (1000 MG) tablet AT NIGHT 03/16/18   Elveria Rising, NP  famotidine (PEPCID) 20 MG tablet TAKE 1 TABLET BY MOUTH EVERY 12 HOURS. DISCONTINUE RANITIDINE 07/08/18   Pyrtle, Carie Caddy, MD  formoterol (PERFOROMIST) 20 MCG/2ML nebulizer solution Take 2 mLs (20 mcg total) by nebulization 2 (two) times daily. 03/16/18   Kozlow, Alvira Philips, MD  levofloxacin (LEVAQUIN) 750 MG tablet Take 1 tablet (750 mg total) by mouth daily. 08/21/18   Roxy Horseman, PA-C  Levonorgestrel-Ethinyl Estradiol (SEASONIQUE) 0.15-0.03 &0.01 MG tablet Take 1 tablet by mouth daily. 01/29/18   Sharyon Cable, CNM  Melatonin 3 MG TABS Take 3 mg by mouth at bedtime as needed (for sleep).     [provider]  mometasone (NASONEX) 50 MCG/ACT nasal spray Place 2 sprays into the nose daily. 03/17/18   Kozlow, Alvira Philips, MD  montelukast (SINGULAIR) 10 MG tablet TAKE 1 TABLET(10 MG) BY MOUTH AT BEDTIME 07/08/18   Kozlow, Alvira Philips, MD  Nebulizer MISC Adult mask with tubing. Use as directed with nebulizer device. 07/20/18   Kozlow, Alvira Philips, MD  ondansetron (ZOFRAN ODT) 4 MG disintegrating tablet Take 1 tablet (4 mg total) by mouth every 8 (eight) hours as needed for nausea. 06/07/18   Garlon Hatchet, PA-C  polyethylene glycol powder (GLYCOLAX/MIRALAX) powder Dissolve 17 grasm in at least 8 ounces water/juice and drink by mouth twice daily Patient taking differently: Take 17 g by mouth daily as needed for mild constipation.  02/18/18   Pyrtle, Carie Caddy, MD  Revefenacin (YUPELRI) 175 MCG/3ML SOLN Inhale 1 vial into the lungs daily. 03/19/18   Kozlow, Alvira Philips, MD  senna (SENOKOT) 8.6 MG  tablet Take 1 tablet by mouth at bedtime.     [provider]  terconazole (TERAZOL 3) 0.8 % vaginal cream USE TOPICALLY IF NEEDED FOR YEAST 04/20/18   [provider]  traZODone (DESYREL) 50 MG tablet Take 2 tablets (100 mg total) by mouth at bedtime. 02/20/18   Elveria Rising, NP    Family History Family History  Problem Relation Age of Onset  . Seizures Mother   . Seizures Sister        1 sister has  . Pancreatic cancer Sister   . Colon cancer Neg Hx   . Colon polyps Neg Hx   . Esophageal cancer Neg Hx   . Rectal cancer Neg Hx   . Stomach cancer Neg Hx     Social History Social History   Tobacco Use  . Smoking status: Never Smoker  . Smokeless tobacco: Never Used  Substance Use Topics  . Alcohol use: No    Alcohol/week: 0.0 standard drinks  . Drug use: No     Allergies   Abilify [aripiprazole]; Seroquel [quetiapine fumarate]; Bactrim [sulfamethoxazole-trimethoprim]; Doxycycline; Amoxicillin-pot clavulanate; and Keppra [levetiracetam]   Review of Systems Review of Systems  All other  systems reviewed and are negative.    Physical Exam Updated Vital Signs BP 108/82 (BP Location: Right Arm)   Pulse (!) 115   Temp 98.2 F (36.8 C) (Oral)   Resp 16   SpO2 98%   Physical Exam Vitals signs and nursing note reviewed.  Constitutional:      General: She is not in acute distress.    Appearance: She is well-developed.  HENT:     Head: Normocephalic and atraumatic.  Eyes:     Conjunctiva/sclera: Conjunctivae normal.  Neck:     Musculoskeletal: Neck supple.  Cardiovascular:     Rate and Rhythm: Normal rate and regular rhythm.     Heart sounds: No murmur.  Pulmonary:     Effort: Pulmonary effort is normal. No respiratory distress.     Breath sounds: Normal breath sounds.  Abdominal:     Palpations: Abdomen is soft.     Tenderness: There is no abdominal tenderness.     Comments: No focal abdominal tenderness, no RLQ tenderness or pain at McBurney's point, no RUQ tenderness or Murphy's sign, no left-sided abdominal tenderness, no fluid wave, or signs of peritonitis   Skin:    General: Skin is warm and dry.  Neurological:     Mental Status: She is alert.  Psychiatric:        Mood and Affect: Mood normal.      ED Treatments / Results  Labs (all labs ordered are listed, but only abnormal results are displayed) Labs Reviewed  URINALYSIS, ROUTINE W REFLEX MICROSCOPIC - Abnormal; Notable for the following components:      Result Value   Color, Urine AMBER (*)    Specific Gravity, Urine 1.033 (*)    Glucose, UA 150 (*)    Ketones, ur 5 (*)    Protein, ur 100 (*)    Leukocytes,Ua LARGE (*)    WBC, UA >50 (*)    All other components within normal limits  URINE CULTURE    EKG None  Radiology Dg Chest 2 View  Result Date: 08/20/2018 CLINICAL DATA:  Wheezing and pharyngitis for the past 2-3 days. EXAM: CHEST - 2 VIEW COMPARISON:  07/02/2018. FINDINGS: Poor inspiration. Grossly normal sized heart. Clear lungs. Normal appearing bones. IMPRESSION: Normal  examination with a poor inspiration noted.  Electronically Signed   By: Beckie SaltsSteven  Reid M.D.   On: 08/20/2018 21:39    Procedures Procedures (including critical care time)  Medications Ordered in ED Medications  levofloxacin (LEVAQUIN) tablet 750 mg (has no administration in time range)     Initial Impression / Assessment and Plan / ED Course  I have reviewed the triage vital signs and the nursing notes.  Pertinent labs & imaging results that were available during my care of the patient were reviewed by me and considered in my medical decision making (see chart for details).        Patient with recurrent UTIs.  Has been taking Augmentin, but has not had any relief.  Also has Keflex at home, but states this does not work either.  Mother reports that Levaquin is the only medicine that seems to work with any bloody.  Prior urine cultures reviewed, many different bacteria have grown with varying levels of resistance.  Will give Levaquin based on past benefit and patient request.  Urine sent for culture.    Final Clinical Impressions(s) / ED Diagnoses   Final diagnoses:  Recurrent UTI    ED Discharge Orders         Ordered    levofloxacin (LEVAQUIN) 750 MG tablet  Daily     08/21/18 0316           Roxy HorsemanBrowning, Naila Elizondo, PA-C 08/21/18 0321    Roxy HorsemanBrowning, Traylon Schimming, PA-C 08/21/18 [id number]    Wickline, Donald, MD 08/21/18 [id number]

## 2018-08-23 LAB — URINE CULTURE: Culture: >=100000 — AB

## 2018-08-24 ENCOUNTER — Telehealth: Payer: Self-pay

## 2018-08-24 ENCOUNTER — Other Ambulatory Visit: Payer: Self-pay | Admitting: *Deleted

## 2018-08-24 NOTE — Telephone Encounter (Signed)
Post ED Visit - Positive Culture Follow-up  Culture report reviewed by antimicrobial stewardship pharmacist: [place] Team []  Elenor Quinones, Pharm.D. []  Heide Guile, Pharm.D., BCPS AQ-ID []  Parks Neptune, Pharm.D., BCPS []  Alycia Rossetti, Pharm.D., BCPS []  [place], Pharm.D., BCPS, AAHIVP []  Legrand Como, Pharm.D., BCPS, AAHIVP []  Salome Arnt, PharmD, BCPS []  Johnnette Gourd, PharmD, BCPS []  Hughes Better, PharmD, BCPS []  Leeroy Cha, PharmD []  Laqueta Linden, PharmD, BCPS []  Albertina Parr, PharmD [place] Team []  Leodis Sias, PharmD []  Lindell Spar, PharmD []  Royetta Asal, PharmD []  Graylin Shiver, Rph []  Rema Fendt) Glennon Mac, PharmD []  Arlyn Dunning, PharmD []  Netta Cedars, PharmD []  Dia Sitter, PharmD []  Leone Haven, PharmD []  Gretta Arab, PharmD []  Theodis Shove, PharmD []  Peggyann Juba, PharmD []  Reuel Boom, PharmD   Positive urine culture Treated with Levofloxacin, organism sensitive to the same and no further patient follow-up is required at this time.  [name] 08/24/2018, 9:24 AM

## 2018-08-24 NOTE — Telephone Encounter (Signed)
Keane Scrape is working in [employer] today and currently she does not have a phone. Per pt's mother, she is only wanting to speak with Jess.

## 2018-08-24 NOTE — Patient Outreach (Signed)
[facility]) Care Management  08/24/2018  Lori Miles Dec 30, 1995 [id number]   Care coordination  [facility] RN CM received a call from the patient's mother, [name], inquiring if [facility] RN CM called her. She was informed that [name] did not call. She states a staff member stating they were from [facility] called to check on [name] and the call was not a pleasant one. She reports a nurse present during the call also heard the person stating they were from [facility].  She voices being aware of most [facility] staff who has previously contacted her. CM attempted to locate information in Epic to assist with connecting the mother to the appropriate staff without success. [facility] RN CM consulted [facility] CMAs.  [facility] RN CM notes [name] was recently at the ED for UTI and there were calls from hospital staff about the UTI  [facility] RN CM returned a call to Patient's mother to inform her that RN CM was unsuccessful in locating the [facility] staff member. Her mother states it was not one of the documented hospital staff.  CM offered to assist prn  Her mother discussed various medical concerns [name] is having recently to include recurrent UTI, moments of singing abnormally until she falls asleep, frequent voiding incontinently, emesis , sinus infection, swelling of her feet with extension to her leg (she is not able to apply shoes). Pt per mother with a history of seizures, GERD, Gout,  CM made suggestions as requested, discussed local infectious disease providers ([address]), lympedema and use of ace wraps vs Jobst/TED hose for possible edema reduction. This patient has not been able to travel to [facility] for further seizure testing.  Her mother voiced appreciation of the assistance offered and discussions completed  Plan  assist prn   [name] L. Lavina Hamman, RN, BSN, [place] Coordinator Office number [phone] Mobile number [phone]  Main [facility] number [phone] Fax  number [phone]

## 2018-08-25 ENCOUNTER — Other Ambulatory Visit: Payer: Self-pay

## 2018-08-25 ENCOUNTER — Ambulatory Visit: Payer: Medicare Other | Attending: Family | Admitting: Physical Therapy

## 2018-08-25 ENCOUNTER — Encounter (HOSPITAL_COMMUNITY): Payer: Self-pay | Admitting: Emergency Medicine

## 2018-08-25 ENCOUNTER — Emergency Department (HOSPITAL_COMMUNITY)
Admission: EM | Admit: 2018-08-25 | Discharge: 2018-08-26 | Disposition: A | Discharge status: Home / Self Care | Payer: Medicare Other | Attending: Emergency Medicine | Admitting: Emergency Medicine

## 2018-08-25 DIAGNOSIS — R2689 Other abnormalities of gait and mobility: Secondary | ICD-10-CM | POA: Insufficient documentation

## 2018-08-25 DIAGNOSIS — Z79899 Other long term (current) drug therapy: Secondary | ICD-10-CM | POA: Diagnosis not present

## 2018-08-25 DIAGNOSIS — N390 Urinary tract infection, site not specified: Secondary | ICD-10-CM | POA: Insufficient documentation

## 2018-08-25 DIAGNOSIS — M6281 Muscle weakness (generalized): Secondary | ICD-10-CM | POA: Insufficient documentation

## 2018-08-25 DIAGNOSIS — J45909 Unspecified asthma, uncomplicated: Secondary | ICD-10-CM | POA: Insufficient documentation

## 2018-08-25 DIAGNOSIS — R2681 Unsteadiness on feet: Secondary | ICD-10-CM | POA: Insufficient documentation

## 2018-08-25 DIAGNOSIS — R32 Unspecified urinary incontinence: Secondary | ICD-10-CM | POA: Diagnosis present

## 2018-08-25 LAB — URINALYSIS, ROUTINE W REFLEX MICROSCOPIC
Bacteria, UA: NONE SEEN
Bilirubin Urine: NEGATIVE
Glucose, UA: NEGATIVE mg/dL
Hgb urine dipstick: NEGATIVE
Ketones, ur: 20 mg/dL — AB
Leukocytes,Ua: NEGATIVE
Nitrite: NEGATIVE
Protein, ur: 30 mg/dL — AB
Specific Gravity, Urine: 1.028 (ref 1.005–1.030)
pH: 5 (ref 5.0–8.0)

## 2018-08-25 LAB — CBG MONITORING, ED: Glucose-Capillary: 148 mg/dL — ABNORMAL HIGH (ref 70–99)

## 2018-08-25 NOTE — ED Notes (Signed)
Sent urine culture with specimen 

## 2018-08-25 NOTE — ED Triage Notes (Signed)
[name] reports pt has been recently treated for UTI, and has had her antibiotics changed twice now. Mother states pt is still having urinary frequency.

## 2018-08-26 ENCOUNTER — Telehealth: Payer: Self-pay | Admitting: Internal Medicine

## 2018-08-26 ENCOUNTER — Other Ambulatory Visit: Payer: Self-pay | Admitting: Internal Medicine

## 2018-08-26 DIAGNOSIS — J449 Chronic obstructive pulmonary disease, unspecified: Secondary | ICD-10-CM | POA: Diagnosis not present

## 2018-08-26 DIAGNOSIS — N390 Urinary tract infection, site not specified: Secondary | ICD-10-CM | POA: Diagnosis not present

## 2018-08-26 MED ORDER — CEPHALEXIN 500 MG PO CAPS: 500.0000 mg | ORAL_CAPSULE | Freq: Three times a day (TID) | ORAL | 0 refills | Status: DC

## 2018-08-26 MED ORDER — CEPHALEXIN 250 MG PO CAPS
500.0000 mg | ORAL_CAPSULE | Freq: Once | ORAL | Status: AC
  Administered 2018-08-26: 500 mg via ORAL
  Filled 2018-08-26: qty 2

## 2018-08-26 NOTE — Therapy (Signed)
[facility] [address] [place], [place], [address] Phone: [phone]   Fax:  [phone]  Physical Therapy Treatment  Patient Details  Name: Lori Miles MRN: [id number] Date of Birth: 07-21-1995 Referring Provider (PT): Rockwell Germany, NP   CLINIC OPERATION CHANGES: Outpatient Neuro Rehab is open at lower capacity following universal masking, social distancing, and patient screening.  The patient's COVID risk of  complications score is 3.  Encounter Date: 08/25/2018  PT End of Session - 08/26/18 1948    Visit Number  8    Number of Visits  11    Date for PT Re-Evaluation  09/15/18    Authorization Type  [employer] Medicare    Authorization Time Period  04-14-18 - 07-11-18; 08-03-18 - 10-16-18    PT Start Time  0802    PT Stop Time  3532   pt ended session early due to not feeling well   PT Time Calculation (min)  33 min    Equipment Utilized During Treatment  Gait belt    Activity Tolerance  Patient limited by fatigue;Other (comment)   c/o not feeling well - mother states she thinks pt has UTI   Behavior During Therapy  Restless       Past Medical History:  Diagnosis Date  . Asthma   . Autism    mom states no actual dx, but admits she has some symptoms  . Bacteriuria   . Chronic constipation   . Cognitive developmental delay   . Disruptive behavior disorder   . Family history of adverse reaction to anesthesia    mother had itching after anesthesia one time  . Frequency-urgency syndrome   . Gait disorder    ORGANIC PER NERUOLGIST NOTE (DR HICKLING)  . Generalized convulsive epilepsy ([place]) NEUROLOGIST-  DR HICKLING  . GERD (gastroesophageal reflux disease)   . Gout   . H/O sinus tachycardia   . History of acute respiratory failure 02/14/2015   SECONDARY TO CAP  . History of recurrent UTIs   . Interstitial cystitis   . Moderate intellectual disability   . OSA (obstructive sleep apnea)    no machine yet    . Partial epilepsy with impairment of consciousness ([place])    FOLLOWED BY DR HICKLING  . Pneumonia 2018 last time  . PONV (postoperative nausea and vomiting)   . Seizure ([place])    most recent sz " at the end of summer"-last sz years ago per mother  . Urinary incontinence     Past Surgical History:  Procedure Laterality Date  . CYSTOSCOPY N/A 06/03/2017   Procedure: CYSTOSCOPY WITH EXAM UNDER ANESTHESIA;  Surgeon: Festus Aloe, MD;  Location: [facility];  Service: Urology;  Laterality: N/A;  . CYSTOSCOPY WITH HYDRODISTENSION AND BIOPSY  04/14/2012   Procedure: CYSTOSCOPY/BIOPSY/HYDRODISTENSION;  Surgeon: Hanley Ben, MD;  Location: [facility];  Service: Urology;;  instillation of marcaine and pyridium  . EUA/  PAP SMEAR/  BREAST EXAM  03-12-2016   dr Ihor Dow [facility]  . EUA/ VAGINOSCOPY/ COLPOSCOPY FO THE HYMENAL RING  10-04-2009    dr Delsa Sale  [facility]  . TONSILLECTOMY      There were no vitals filed for this visit.  Subjective Assessment - 08/26/18 1940    Subjective  [name] states she had UTI - was seen in ED last week and was prescribed an antibiotic; mother reports pt continues to wet herself, thinks this is indicative of persistent UTI:  mother states  pt was unable to tolerate wearing the compression hose - says they were too tight, and says the stockings with the zippers were not comfortable for her - pt continues to have swellling in her bil. lower legs and ankles    Patient is accompained by:  Family member    Pertinent History  Cognitive developmentally delayed:  recurrent falls:  weakness:  tremor, unspecified:  generalized convulsive epilepsy;  autism spectrum disorder    Patient Stated Goals  improve walking and try to stop the tremors    Currently in Pain?  No/denies                       [facility] Adult PT Treatment/Exercise - 08/26/18 0001      Transfers   Transfers  Sit to Stand    Number of Reps  Other reps  (comment)   5 reps with UE support  - feet on Airex for balance training     Ambulation/Gait   Ambulation/Gait  Yes    Ambulation/Gait Assistance  5: Supervision    Ambulation Distance (Feet)  115 Feet    Assistive device  1 person hand held assist;None    Gait Pattern  Step-through pattern   LLE internally rotates with foot flat contact at stance   Ambulation Surface  Level;Indoor      Knee/Hip Exercises: Aerobic   Recumbent Bike  SciFit level 1.5 -- 2 1/2 minutes with rest period needed; pt declined to continue with this ex. after the initial 2.5 minutes      Knee/Hip Exercises: Standing   Hip Abduction  Stengthening;Right;Left;1 set;10 reps;Knee straight   2# weight used on each leg   Hip Extension  Stengthening;Right;Left;1 set;10 reps;Knee straight   2# weight used on each leg       Neuro Re-ed:  Standing balance activity - touching colored discs for improved SLS and coordination on each leg  Rockerboard inside // bars - bil. UE support - with min guard assist - 10 reps anterior/posterior for improved weight shift          PT Long Term Goals - 08/04/18 1341      PT LONG TERM GOAL #1   Title  Pt. will amb. 500' on flat, even surface without LOB.      Baseline  230' on 08-03-18 with hand held assist - pt able to amb. without assist    Time  4    Period  Weeks    Status  On-going    Target Date  09/15/18      PT LONG TERM GOAL #2   Title  Pt will perform at least 10" on recumbent bike nonstop for improved endurance/activity tolerance.    Baseline  6 1/2" on 05-25-18;pt performed 5" on 08-03-18    Time  4    Period  Weeks    Status  On-going    Target Date  09/15/18      PT LONG TERM GOAL #3   Title  Mother will verbalize understanding of HEP to assist with LE strengthening.      Baseline  met 05-25-18    Status  Achieved      PT LONG TERM GOAL #4   Title  Perform sit to stand 10 reps without UE support from mat to demo increased bil. LE strength.    Baseline   5 reps performed on 08-03-18    Time  4    Period  Weeks  Status  New    Target Date  09/15/18            Plan - 08/26/18 1951    Clinical Impression Statement  Pt had decreased participation/compliance in today's PT session due to c/o fatigue and not generalized not feeling well; pt's mother states she thinks pt still has UTI as she continues to wet herself.  Mother concerned about edema in pt's bil. LE's, however, she states pt is unable to tolerate wearing the compression stockings.  Progress limited by decreased participation and compliance, possibly due to other medical complications.      Rehab Potential  Good    PT Frequency  1x / week    PT Duration  4 weeks    PT Treatment/Interventions  ADLs/Self Care Home Management;DME Instruction;Gait training;Stair training;Orthotic Fit/Training;Patient/family education;Neuromuscular re-education;Balance training;Therapeutic exercise;Therapeutic activities    PT Next Visit Plan  cont LE strengthening; balance and gait training    Consulted and Agree with Plan of Care  Patient       Patient will benefit from skilled therapeutic intervention in order to improve the following deficits and impairments:  Abnormal gait, Decreased balance, Decreased strength, Decreased activity tolerance, Decreased endurance, Decreased safety awareness, Pain, Decreased coordination  Visit Diagnosis: Other abnormalities of gait and mobility  Unsteadiness on feet  Muscle weakness (generalized)     Problem List Patient Active Problem List   Diagnosis Date Noted  . Glucosuria 06/01/2018  . Steroid-induced diabetes ([place]) 06/01/2018  . Not well controlled moderate persistent asthma 04/29/2018  . Gastroesophageal reflux disease 04/29/2018  . Recurrent infections 04/29/2018  . Dysuria 02/20/2018  . Chronic rhinitis 04/03/2017  . Recurrent falls 03/21/2017  . Weakness 03/21/2017  . Venous insufficiency 03/21/2017  . Excessive daytime sleepiness  03/05/2016  . 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 Cough variant asthma vs UACS/ vcd  03/15/2015  . Autism spectrum disorder 02/14/2015  . Nausea with vomiting 02/14/2015  . Obesity, morbid ([place]) 12/13/2014  . 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 Acanthosis nigricans 11/14/2014  . Toeing-in 08/29/2014  . Generalized convulsive epilepsy ([place]) 09/28/2012  . Partial epilepsy with impairment of consciousness ([place]) 09/28/2012  . 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 Recurrent urinary tract infection 08/01/2011  . Asthma 08/01/2011  . Chronic constipation 08/01/2011  . Contraception 08/01/2011    Alda Lea, PT 08/26/2018, 7:55 PM  [facility] [address] [place], [place], [address] Phone: [phone]   Fax:  [phone]  Name: Lori Miles MRN: [id number] Date of Birth: 08-15-1995

## 2018-08-26 NOTE — ED Notes (Signed)
Discharge instructions and follow up care discussed with mother. Mother verbalized understanding. All questions answered.

## 2018-08-26 NOTE — Discharge Instructions (Addendum)
Her urine culture from June 4 showed she had infection with two different germs - Klebsiella pneumoniae and Proteus mirabilis. Continue taking the levofloxacin. That will treat the Klebsiella pneumoniae infection. Start giving her cephalexin to treat the Proteus mirabilis infection.  Follow up with her urologist after she has finished taking all of the antibiotics.  You will have to work with her gastroenterologist regarding her problems with her bowels. Antibiotics will frequently cause diarrhea because of killing some of the good germs in the colon.

## 2018-08-26 NOTE — Telephone Encounter (Signed)
Pt mother called stating pt has been back and forth with urology regarding UTIs. They've been dropping off urine samples and pt has been back and forth to the ER. Pt continues wetting herself and she had to use 2 pull ups just to keep her from wetting the car to take her to ER. She is continuing to have a cough. Pt has another refill on her zofran so doesn't need that yet. She will bring pt in to Dr. Sharlet Salina once UTIs are resolved. Please advise.

## 2018-08-26 NOTE — Telephone Encounter (Signed)
[name] reported that pt is constipated and bleeding rectally.  Please advise.

## 2018-08-26 NOTE — ED Provider Notes (Signed)
[facility] Provider Note   CSN: [id number] Arrival date & time: 08/25/18  2104    History   Chief Complaint Chief Complaint  Patient presents with  . Recurrent UTI    HPI Lori Miles is a 23 y.o. female.   The history is provided by a parent. The history is limited by the condition of the patient (Developmental delay, nonverbal).  She has history of asthma, autism, seizures and has frequent urinary tract infections.  She is currently taking levofloxacin for urinary tract infection.  However, mother noted that she has been wetting herself which she usually only does when she has a urinary infection.  She has not had fever and there has been no vomiting.  Mother also expresses some concern about problems with constipation and diarrhea.  She is seeing a gastroenterologist regarding that.  Past Medical History:  Diagnosis Date  . Asthma   . Autism    mom states no actual dx, but admits she has some symptoms  . Bacteriuria   . Chronic constipation   . Cognitive developmental delay   . Disruptive behavior disorder   . Family history of adverse reaction to anesthesia    mother had itching after anesthesia one time  . Frequency-urgency syndrome   . Gait disorder    ORGANIC PER NERUOLGIST NOTE (DR HICKLING)  . 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 GERD (gastroesophageal reflux disease)   . Gout   . H/O sinus tachycardia   . History of acute respiratory failure 02/14/2015   SECONDARY TO CAP  . History of recurrent UTIs   . Interstitial cystitis   . Moderate intellectual disability   . OSA (obstructive sleep apnea)    no machine yet   . Partial epilepsy with impairment of consciousness (HCC)    FOLLOWED BY DR HICKLING  . Pneumonia 2018 last time  . PONV (postoperative nausea and vomiting)   . Seizure (HCC)    most recent sz " at the end of summer"-last sz years ago per mother  . Urinary incontinence      Patient Active Problem List   Diagnosis Date Noted  . Glucosuria 06/01/2018  . 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 Not well controlled moderate persistent asthma 04/29/2018  . Gastroesophageal reflux disease 04/29/2018  . Recurrent infections 04/29/2018  . Dysuria 02/20/2018  . Chronic rhinitis 04/03/2017  . Recurrent falls 03/21/2017  . Weakness 03/21/2017  . Venous insufficiency 03/21/2017  . Excessive daytime sleepiness 03/05/2016  . 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 Cough variant asthma vs UACS/ vcd  03/15/2015  . Autism spectrum disorder 02/14/2015  . Nausea with vomiting 02/14/2015  . Obesity, morbid (HCC) 12/13/2014  . 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 Acanthosis nigricans 11/14/2014  . Toeing-in 08/29/2014  . 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 Recurrent urinary tract infection 08/01/2011  . Asthma 08/01/2011  . Chronic constipation 08/01/2011  . Contraception 08/01/2011    Past Surgical History:  Procedure Laterality Date  . CYSTOSCOPY N/A 06/03/2017   Procedure: CYSTOSCOPY WITH EXAM UNDER ANESTHESIA;  Surgeon: Jerilee FieldEskridge, [name], MD;  [facility];  Service: Urology;  Laterality: N/A;  . CYSTOSCOPY WITH HYDRODISTENSION AND BIOPSY  04/14/2012   Procedure: CYSTOSCOPY/BIOPSY/HYDRODISTENSION;  Surgeon: Lindaann SloughMarc-Henry Nesi, MD;  Location: [facility];  Service: Urology;;  instillation of marcaine and pyridium  . EUA/  PAP SMEAR/  BREAST EXAM  03-12-2016   dr [facility]way[facility]  . EUA/ VAGINOSCOPY/ COLPOSCOPY FO THE HYMENAL RING  10-04-2009    [facility]  . TONSILLECTOMY       OB History   No obstetric history on file.      Home Medications    Prior to Admission  medications   Medication Sig Start Date End Date Taking? Authorizing Provider  acetaminophen (TYLENOL) 325 MG tablet Take 2 tablets (650 mg total) by mouth every 6 (six) hours as needed for mild pain or moderate pain. 02/27/17   Everlene Farrieransie, William, PA-C  albuterol (PROVENTIL) (2.5 MG/3ML) 0.083% nebulizer solution INHALE CONTENTS OF 1 VIAL IN NEBULIZER EVERY 4 TO 6 HOURS IF NEEDED FOR COUGH OR WHEEZING 03/17/18   Kozlow, Alvira PhilipsEric J, MD  budesonide (PULMICORT) 1 MG/2ML nebulizer solution Take 2 mLs (1 mg total) by nebulization 4 (four) times daily. During upper respiratory infections and asthma flares for 1-2 weeks. 06/11/18   Kozlow, Alvira PhilipsEric J, MD  cetirizine (ZYRTEC) 10 MG tablet TAKE 1 TABLET(10 MG) BY MOUTH DAILY 07/08/18   Kozlow, Alvira PhilipsEric J, MD  clotrimazole-betamethasone (LOTRISONE) cream Apply to affected area 2 times daily prn 06/29/18   Elvina SidleLauenstein, Kurt, MD  DEPAKOTE 500 MG DR tablet TAKE 1 TABLET BY MOUTH IN THE MORNING AND 2 AT NIGHT Patient taking differently: Take 500-1,000 mg by mouth as directed. TAKE 1 (500 MG) TABLET BY MOUTH IN THE MORNING AND 2 (1000 MG) tablet AT NIGHT 03/16/18   Elveria RisingGoodpasture, Tina, NP  famotidine (PEPCID) 20 MG tablet TAKE 1 TABLET BY MOUTH EVERY 12 HOURS. DISCONTINUE RANITIDINE 07/08/18   Pyrtle, Carie CaddyJay M, MD  formoterol (PERFOROMIST) 20 MCG/2ML nebulizer solution Take 2 mLs (20 mcg total) by nebulization 2 (two) times daily. 03/16/18   Kozlow, Alvira PhilipsEric J, MD  levofloxacin (LEVAQUIN) 750 MG tablet Take 1 tablet (750 mg total) by mouth daily. 08/21/18   Roxy HorsemanBrowning, Robert, PA-C  Levonorgestrel-Ethinyl Estradiol (SEASONIQUE) 0.15-0.03 &0.01 MG tablet Take 1 tablet by mouth daily. 01/29/18   Sharyon Cableogers, Veronica C, CNM  Melatonin 3 MG TABS Take 3 mg by mouth at bedtime as needed (for sleep).     [provider]  mometasone (NASONEX) 50 MCG/ACT nasal spray Place 2 sprays into the nose daily. 03/17/18   Kozlow, Alvira PhilipsEric J, MD  montelukast (SINGULAIR) 10 MG tablet TAKE 1 TABLET(10 MG) BY MOUTH AT  BEDTIME 07/08/18   Kozlow, Alvira PhilipsEric J, MD  Nebulizer MISC Adult mask with tubing. Use as directed with nebulizer device. 07/20/18   Kozlow, Alvira PhilipsEric J, MD  ondansetron (ZOFRAN ODT) 4 MG disintegrating tablet Take 1 tablet (4 mg total) by mouth every 8 (eight) hours as needed for nausea. 06/07/18   Garlon HatchetSanders, Lisa M, PA-C  polyethylene glycol powder (GLYCOLAX/MIRALAX) powder Dissolve 17 grasm in at least 8 ounces water/juice and drink by mouth twice daily Patient taking differently: Take 17 g by mouth daily as needed for mild constipation.  02/18/18   Pyrtle, Carie CaddyJay M, MD  Revefenacin (YUPELRI) 175 MCG/3ML SOLN Inhale 1 vial into the lungs daily. 03/19/18   Kozlow, Alvira PhilipsEric J, MD  senna (SENOKOT) 8.6 MG tablet Take 1 tablet by mouth at bedtime.     [provider]  terconazole (TERAZOL 3) 0.8 % vaginal cream USE TOPICALLY IF NEEDED FOR YEAST 04/20/18   [provider]  traZODone (DESYREL) 50 MG tablet Take  2 tablets (100 mg total) by mouth at bedtime. 02/20/18   Rockwell Germany, NP    Family History Family History  Problem Relation Age of Onset  . Seizures Mother   . Seizures Sister        1 sister has  . Pancreatic cancer Sister   . Colon cancer Neg Hx   . Colon polyps Neg Hx   . Esophageal cancer Neg Hx   . Rectal cancer Neg Hx   . Stomach cancer Neg Hx     Social History Social History   Tobacco Use  . Smoking status: Never Smoker  . Smokeless tobacco: Never Used  Substance Use Topics  . Alcohol use: No    Alcohol/week: 0.0 standard drinks  . Drug use: No     Allergies   Abilify [aripiprazole]; Seroquel [quetiapine fumarate]; Bactrim [sulfamethoxazole-trimethoprim]; Doxycycline; Amoxicillin-pot clavulanate; and Keppra [levetiracetam]   Review of Systems Review of Systems  Unable to perform ROS: Patient nonverbal     Physical Exam Updated Vital Signs BP 118/86   Pulse (!) 110   Temp 98.5 F (36.9 C) (Oral)   Resp 20   Ht 4' 9.5" (1.461 m)   Wt 83.9 kg   SpO2 95%    BMI 39.34 kg/m   Physical Exam Vitals signs and nursing note reviewed.    23 year old female, resting comfortably and in no acute distress. Vital signs are significant for mildly elevated heart rate. Oxygen saturation is 95%, which is normal. Head is normocephalic and atraumatic. PERRLA, EOMI. Oropharynx is clear. Neck is nontender and supple without adenopathy or JVD. Back is nontender and there is no CVA tenderness. Lungs are clear without rales, wheezes, or rhonchi. Chest is nontender. Heart has regular rate and rhythm without murmur. Abdomen is soft, flat, nontender without masses or hepatosplenomegaly and peristalsis is normoactive. Extremities have no cyanosis or edema, full range of motion is present. Skin is warm and dry without rash. Neurologic: Awake, responds to pain but does not follow commands well, cranial nerves are intact, there are no motor or sensory deficits.  ED Treatments / Results  Labs (all labs ordered are listed, but only abnormal results are displayed) Labs Reviewed  URINALYSIS, ROUTINE W REFLEX MICROSCOPIC - Abnormal; Notable for the following components:      Result Value   Ketones, ur 20 (*)    Protein, ur 30 (*)    All other components within normal limits  CBG MONITORING, ED - Abnormal; Notable for the following components:   Glucose-Capillary 148 (*)    All other components within normal limits   Procedures Procedures   Medications Ordered in ED Medications  cephALEXin (KEFLEX) capsule 500 mg (500 mg Oral Given 08/26/18 0214)     Initial Impression / Assessment and Plan / ED Course  I have reviewed the triage vital signs and the nursing notes.  Pertinent lab results that were available during my care of the patient were reviewed by me and considered in my medical decision making (see chart for details).  Urinary incontinence suggestive of urinary tract infection.  However, urinalysis looks essentially normal.  Old records are reviewed, and  she was seen on June 4 at which time urine culture showed greater than 100,000 Klebsiella pneumoniae, and 50,000 Proteus mirabilis.  She was prescribed levofloxacin, which would adequately treat Klebsiella pneumoniae, but not Proteus mirabilis.  Proteus is sensitive to cephalexin.  In spite of essentially clean urinalysis, I feel it is appropriate to give her a  cephalosporin to treat the Proteus infection.  She is discharged with prescription for cephalexin.  She is referred back to her urologist for evaluation following completion of course of both antibiotics.  I have also talked with mother about the problems with multiple antibiotics and tendency to diarrhea from antibiotics.  Final Clinical Impressions(s) / ED Diagnoses   Final diagnoses:  Urinary tract infection without hematuria, site unspecified    ED Discharge Orders         Ordered    cephALEXin (KEFLEX) 500 MG capsule  3 times daily     08/26/18 0208           Dione BoozeGlick, Noami Bove, MD 08/26/18 0225

## 2018-08-26 NOTE — Telephone Encounter (Signed)
Pts mother states pt has been having frequent UTI's, she is currently finishing Levaquin and has been placed on another antibiotic. States pt is having issues with constipation and rectal bleeding. She is taking miralax and has a BM every day but some days the stool is hard and other days loose. Mother wonders if she needs an xray to see if she may have a blockage. Please advise.

## 2018-08-27 DIAGNOSIS — J4520 Mild intermittent asthma, uncomplicated: Secondary | ICD-10-CM | POA: Diagnosis not present

## 2018-08-27 DIAGNOSIS — G809 Cerebral palsy, unspecified: Secondary | ICD-10-CM | POA: Diagnosis not present

## 2018-08-27 LAB — URINE CULTURE: Culture: <10000 — AB

## 2018-08-27 MED ORDER — LUBIPROSTONE 8 MCG PO CAPS: 8.0000 ug | ORAL_CAPSULE | Freq: Two times a day (BID) | ORAL | 3 refills | Status: DC

## 2018-08-27 NOTE — Telephone Encounter (Signed)
Ok, thanks Let's try her on amitiza 8 mcg BID with food MiraLax can become the back-up laxative if ineffective This medication should be taken BID for maximally efficacy THey can let me know if not helping

## 2018-08-27 NOTE — Telephone Encounter (Signed)
She had an xray last summer for these symptoms and it should increased colonic stool, which is very likely the case now Would increase to MiraLax 17 g BID, rather than once daily.  Can skip 2nd dose on any given day if stools are too loose If not better, she should let me know

## 2018-08-27 NOTE — Telephone Encounter (Signed)
Spoke with pts mother and she states she is already giving her the miralax bid. Please advise.

## 2018-08-27 NOTE — Telephone Encounter (Signed)
Called spoke with patient's mother [name] - she had called to inquire about appt information for patient's upcoming NPSG and MSLT.  [name] has already received said information in the mail.  [name] is aware she may speak with anyone in the office and they are able to help her - [name] is okay with this and voiced her understanding.  Nothing further needed at this time; will sign off.

## 2018-08-27 NOTE — Telephone Encounter (Signed)
Spoke with pts mother and she is aware, script sent to pharmacy.

## 2018-08-27 NOTE — Telephone Encounter (Signed)
Per the pt's mother, she will only speak to [place]. Will make sure that Janett Billow is aware of this so that she may contact the pt's mother.

## 2018-08-28 NOTE — Telephone Encounter (Signed)
[name] requested a call back regarding "what we had been discussing."

## 2018-08-28 NOTE — Telephone Encounter (Signed)
All questions answered about new start Amitiza.  She will call back for additional questions or concerns.

## 2018-09-01 ENCOUNTER — Other Ambulatory Visit: Payer: Self-pay

## 2018-09-01 ENCOUNTER — Ambulatory Visit: Payer: Medicare Other | Admitting: Physical Therapy

## 2018-09-01 DIAGNOSIS — R2689 Other abnormalities of gait and mobility: Secondary | ICD-10-CM

## 2018-09-01 DIAGNOSIS — R2681 Unsteadiness on feet: Secondary | ICD-10-CM

## 2018-09-01 DIAGNOSIS — M6281 Muscle weakness (generalized): Secondary | ICD-10-CM

## 2018-09-02 NOTE — Therapy (Signed)
[facility] [address] [place], [place], [address] Phone: [phone]   Fax:  [phone]  Physical Therapy Treatment  Patient Details  Name: Lori Miles MRN: [id number] Date of Birth: Apr 01, 1995 Referring Provider (PT): Rockwell Germany, NP  CLINIC OPERATION CHANGES: Outpatient Neuro Rehab is open at lower capacity following universal masking, social distancing, and patient screening.  The patient's COVID risk of  complications score is 3.   Encounter Date: 09/01/2018  PT End of Session - 09/02/18 1936    Visit Number  9    Number of Visits  11    Date for PT Re-Evaluation  09/15/18    Authorization Type  [employer] Medicare    Authorization Time Period  04-14-18 - 07-11-18; 08-03-18 - 10-16-18    PT Start Time  0803    PT Stop Time  0850    PT Time Calculation (min)  47 min    Activity Tolerance  Patient tolerated treatment well   c/o not feeling well - mother states she thinks pt has UTI   Behavior During Therapy  [facility] for tasks assessed/performed       Past Medical History:  Diagnosis Date  . Asthma   . Autism    mom states no actual dx, but admits she has some symptoms  . Bacteriuria   . Chronic constipation   . Cognitive developmental delay   . Disruptive behavior disorder   . Family history of adverse reaction to anesthesia    mother had itching after anesthesia one time  . Frequency-urgency syndrome   . Gait disorder    ORGANIC PER NERUOLGIST NOTE (DR HICKLING)  . Generalized convulsive epilepsy ([place]) NEUROLOGIST-  DR HICKLING  . GERD (gastroesophageal reflux disease)   . Gout   . H/O sinus tachycardia   . History of acute respiratory failure 02/14/2015   SECONDARY TO CAP  . History of recurrent UTIs   . Interstitial cystitis   . Moderate intellectual disability   . OSA (obstructive sleep apnea)    no machine yet   . Partial epilepsy with impairment of consciousness ([place])    FOLLOWED BY DR  HICKLING  . Pneumonia 2018 last time  . PONV (postoperative nausea and vomiting)   . Seizure ([place])    most recent sz " at the end of summer"-last sz years ago per mother  . Urinary incontinence     Past Surgical History:  Procedure Laterality Date  . CYSTOSCOPY N/A 06/03/2017   Procedure: CYSTOSCOPY WITH EXAM UNDER ANESTHESIA;  Surgeon: Festus Aloe, MD;  [facility];  Service: Urology;  Laterality: N/A;  . CYSTOSCOPY WITH HYDRODISTENSION AND BIOPSY  04/14/2012   Procedure: CYSTOSCOPY/BIOPSY/HYDRODISTENSION;  Surgeon: Hanley Ben, MD;  Location: [place];  Service: Urology;;  instillation of marcaine and pyridium  . EUA/  PAP SMEAR/  BREAST EXAM  03-12-2016   dr Ihor Dow [facility]  . EUA/ VAGINOSCOPY/ COLPOSCOPY FO THE HYMENAL RING  10-04-2009    dr Delsa Sale  [facility]  . TONSILLECTOMY      There were no vitals filed for this visit.  Subjective Assessment - 09/02/18 1929    Subjective  [name] states pt is feeling better today compared to last week    Patient is accompained by:  Family member    Pertinent History  Cognitive developmentally delayed:  recurrent falls:  weakness:  tremor, unspecified:  generalized convulsive epilepsy;  autism spectrum disorder    Patient Stated Goals  improve walking and try to stop the tremors    Currently in Pain?  No/denies                       [facility] Adult PT Treatment/Exercise - 09/02/18 0001      Transfers   Transfers  Sit to Stand    Number of Reps  Other reps (comment)   5   Comments  feet on blue Airex pad; 1 UE support used      Ambulation/Gait   Ambulation/Gait  Yes    Ambulation/Gait Assistance  5: Supervision    Ambulation Distance (Feet)  210 Feet    Gait Pattern  Step-through pattern   LLE internally rotates with foot flat contact at stance   Ambulation Surface  Level;Indoor    Stairs  Yes    Stairs Assistance  5: Supervision    Stair Management Technique  Two  rails;Alternating pattern;Step to pattern   step to pattern with descension   Number of Stairs  4    Height of Stairs  6      Neuro Re-ed    Neuro Re-ed Details   pt performed standing balance activity with Zoom ball for UE AROM and coordination 20 reps       Knee/Hip Exercises: Aerobic   Recumbent Bike  SciFit level 1.5 -- 2 minutes with rest period needed; pt perfomed 3" after resting for 5" total       Knee/Hip Exercises: Machines for Strengthening   Cybex Leg Press  bil. LE's 40# 3 sets 10 reps      Knee/Hip Exercises: Standing   Hip Flexion  Both;1 set;10 reps;Stengthening;Left   3# weight   Hip Abduction  Stengthening;Right;Left;1 set;10 reps;Knee straight   3# weight used on each leg   Hip Extension  Stengthening;Right;Left;1 set;10 reps;Knee straight   3# weight used on each leg      NeuroRe-ed:   Pt performed ladder activity on floor to facilitate incr. Step length - min hand held assist  Performed stepping stones activity with 6 stones of various heights to improve SLS balance and coordination of each LE -  min hand held assist given for support            PT Long Term Goals - 09/02/18 1939      PT LONG TERM GOAL #1   Title  Pt. will amb. 500' on flat, even surface without LOB.      Baseline  230' on 08-03-18 with hand held assist - pt able to amb. without assist    Time  4    Period  Weeks    Status  On-going      PT LONG TERM GOAL #2   Title  Pt will perform at least 10" on recumbent bike nonstop for improved endurance/activity tolerance.    Baseline  6 1/2" on 05-25-18;pt performed 5" on 08-03-18    Time  4    Period  Weeks    Status  On-going      PT LONG TERM GOAL #3   Title  Mother will verbalize understanding of HEP to assist with LE strengthening.      Baseline  met 05-25-18    Status  Achieved      PT LONG TERM GOAL #4   Title  Perform sit to stand 10 reps without UE support from mat to demo increased bil. LE strength.    Baseline  5 reps  performed on  08-03-18    Time  4    Period  Weeks    Status  New            Plan - 09/02/18 1937    Clinical Impression Statement  Pt had increased participation in today's PT session with less c/o fatigue; pt performed variety of standing balance activities on floor with min guard assist; increased endurance and activity tolerance noted in today's PT session    Rehab Potential  Good    PT Frequency  1x / week    PT Duration  4 weeks    PT Treatment/Interventions  ADLs/Self Care Home Management;DME Instruction;Gait training;Stair training;Orthotic Fit/Training;Patient/family education;Neuromuscular re-education;Balance training;Therapeutic exercise;Therapeutic activities    PT Next Visit Plan  cont LE strengthening; balance and gait training    Consulted and Agree with Plan of Care  Patient       Patient will benefit from skilled therapeutic intervention in order to improve the following deficits and impairments:  Abnormal gait, Decreased balance, Decreased strength, Decreased activity tolerance, Decreased endurance, Decreased safety awareness, Pain, Decreased coordination  Visit Diagnosis: 1. Other abnormalities of gait and mobility   2. Unsteadiness on feet   3. Muscle weakness (generalized)        Problem List Patient Active Problem List   Diagnosis Date Noted  . Glucosuria 06/01/2018  . Steroid-induced diabetes ([place]) 06/01/2018  . Not well controlled moderate persistent asthma 04/29/2018  . Gastroesophageal reflux disease 04/29/2018  . Recurrent infections 04/29/2018  . Dysuria 02/20/2018  . Chronic rhinitis 04/03/2017  . Recurrent falls 03/21/2017  . Weakness 03/21/2017  . Venous insufficiency 03/21/2017  . Excessive daytime sleepiness 03/05/2016  . 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 Cough variant asthma vs UACS/ vcd  03/15/2015  . Autism spectrum disorder 02/14/2015  . Nausea with vomiting 02/14/2015  . Obesity, morbid ([place]) 12/13/2014  . Mental  retardation, 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) 12/13/2014  . 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 Acanthosis nigricans 11/14/2014  . Toeing-in 08/29/2014  . Generalized convulsive epilepsy ([place]) 09/28/2012  . Partial epilepsy with impairment of consciousness ([place]) 09/28/2012  . 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 Recurrent urinary tract infection 08/01/2011  . Asthma 08/01/2011  . Chronic constipation 08/01/2011  . Contraception 08/01/2011    Alda Lea, [place] 09/02/2018, 7:41 PM  [facility] [address] [place], [place], [address] Phone: [phone]   Fax:  [phone]  Name: Lori Miles MRN: [id number] Date of Birth: 08/28/1995

## 2018-09-03 IMAGING — CT CT HEAD W/O CM
4 series · 15 of 47 positions shown, 17 images · non-contrast
Comparison: 07/07/2015

CLINICAL DATA: 21-year-old female with a history of seizure

EXAM:
CT HEAD WITHOUT CONTRAST
TECHNIQUE: Contiguous axial images were obtained from the base of the skull
through the vertex without intravenous contrast.

[Series 3: head wo · axial · 0.42mm/px · z∈[-152,-52]mm · 7 of 28 slices shown, 9 images]
[im 4/28  brain]
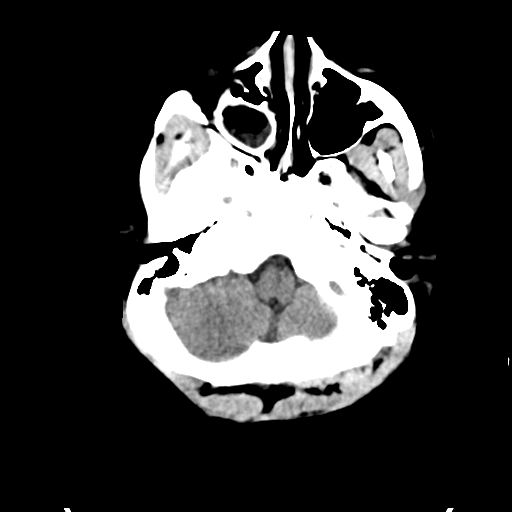
[im 4/28  bone]
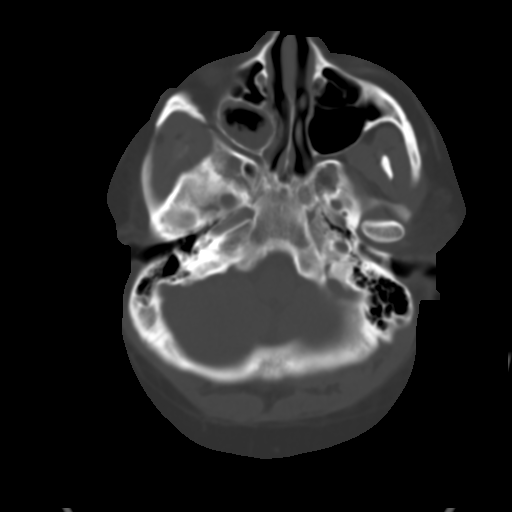
[im 7/28  brain]
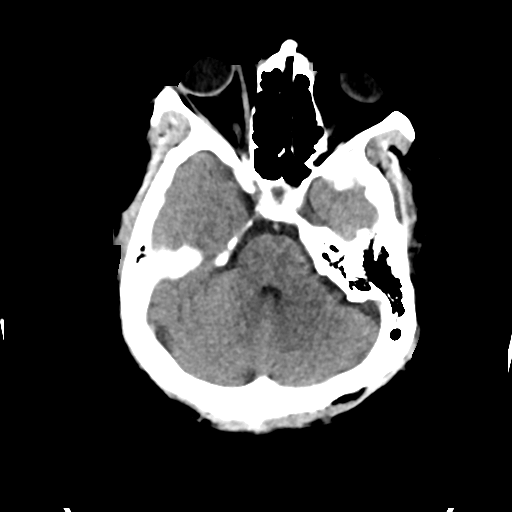
[im 11/28  brain]
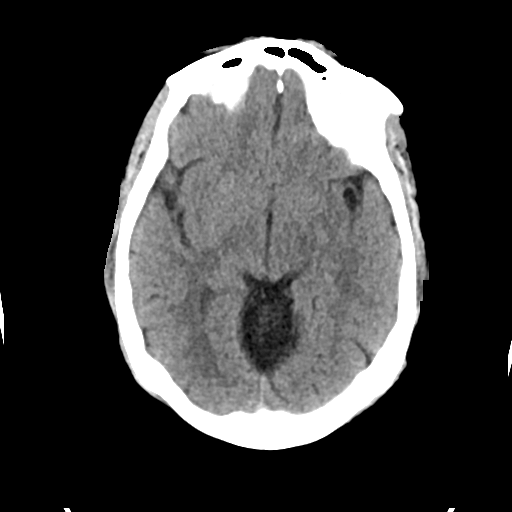
[im 14/28  brain]
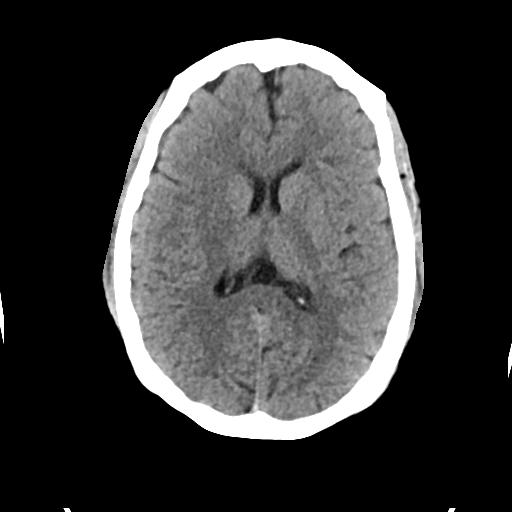
[im 17/28  brain]
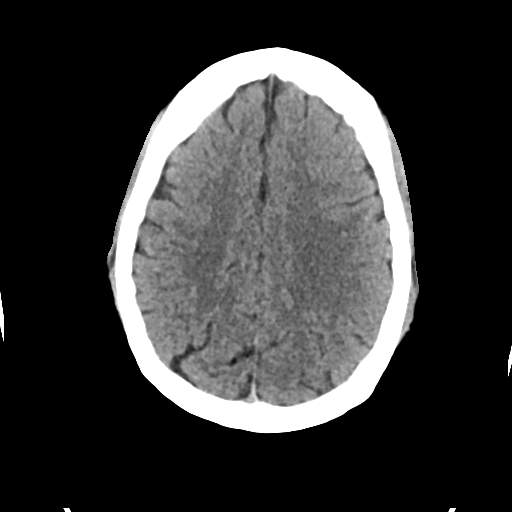
[im 17/28  bone]
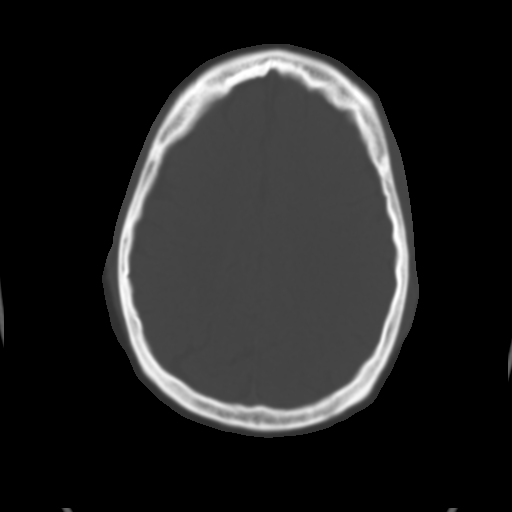
[im 21/28  brain]
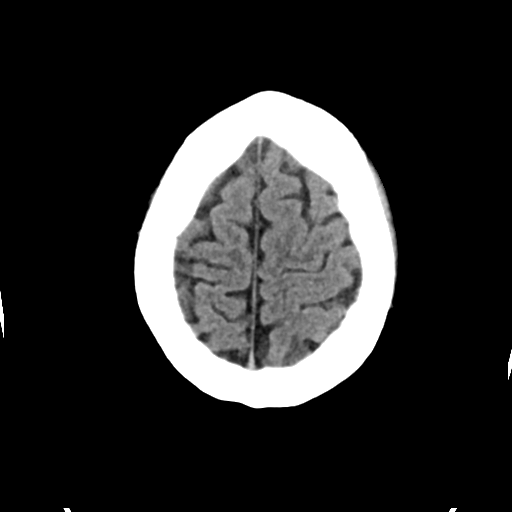
[im 24/28  brain]
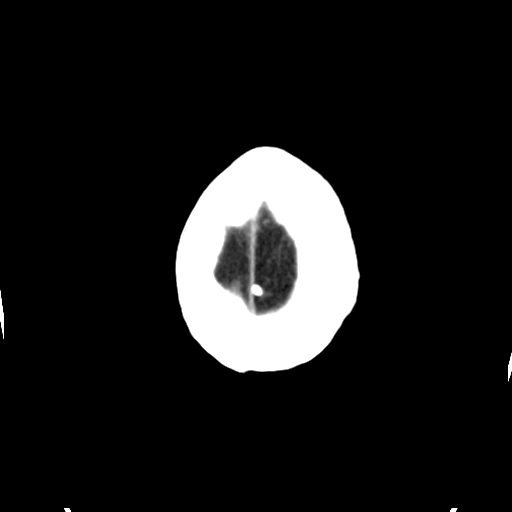

[Series 4: head bone · axial · 0.42mm/px · z∈[-156,-142]mm · 2 of 69 slices shown]
[im 7/69  bone]
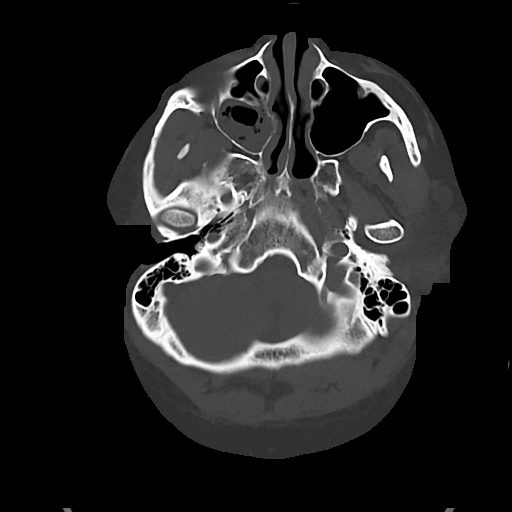
[im 14/69  bone]
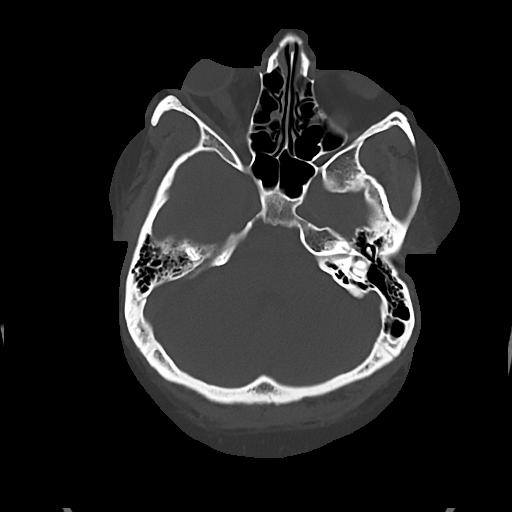

[Series 5: cor soft · coronal · 0.27mm/px · 3 of 67 slices shown]
[im 23/67  brain]
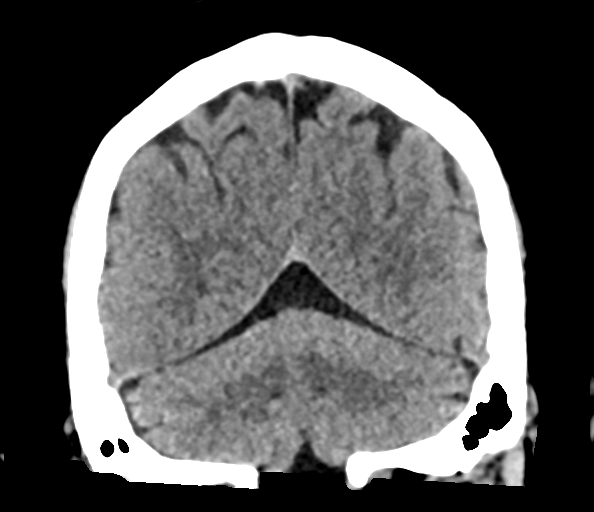
[im 30/67  brain]
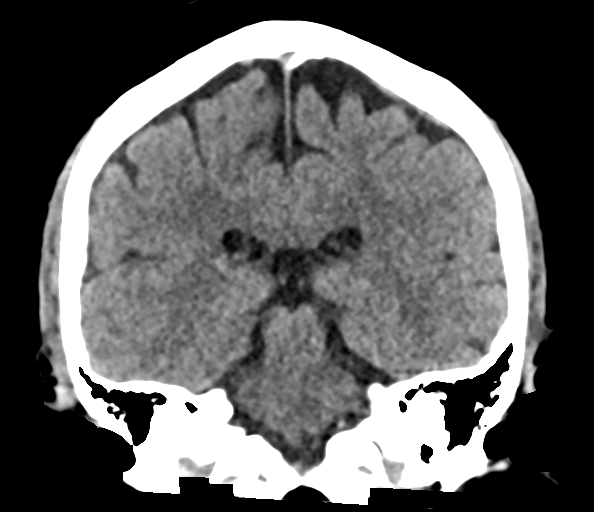
[im 37/67  brain]
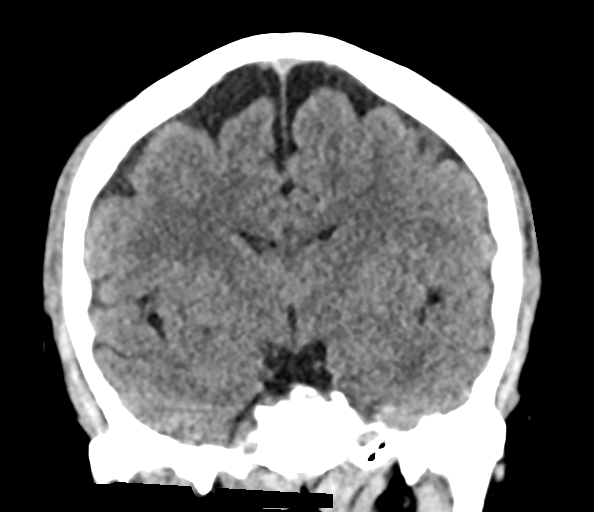

[Series 6: sag soft · sagittal · 0.27mm/px · 3 of 55 slices shown]
[im 19/55  brain]
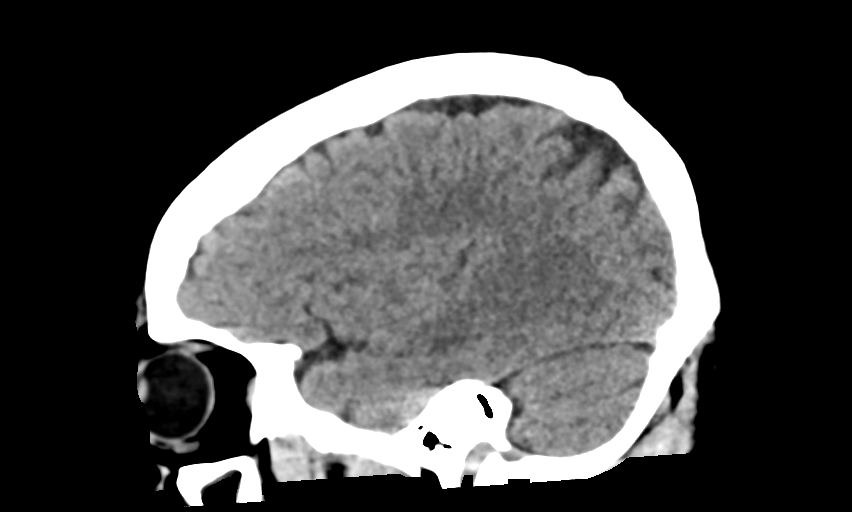
[im 28/55  brain]
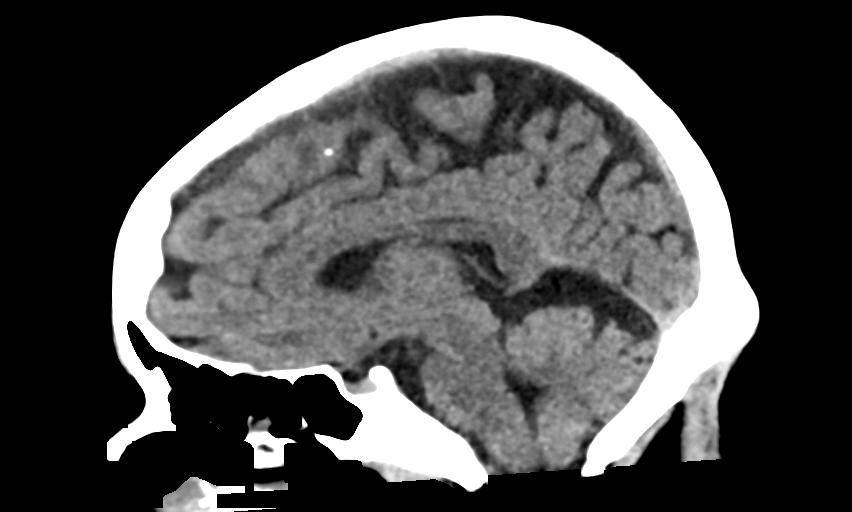
[im 37/55  brain]
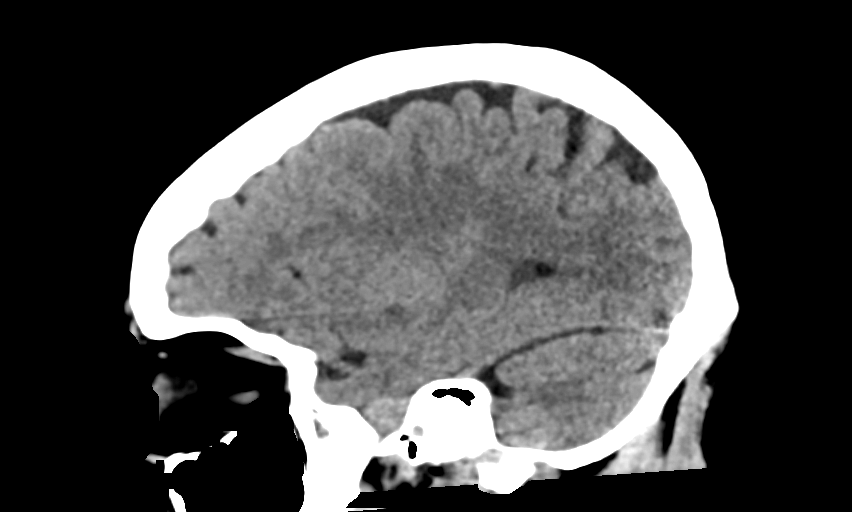

[15 of 47 positions shown; findings below may reference images not displayed]

FINDINGS: Brain: No acute intracranial hemorrhage. No midline shift or mass
effect. Gray-white differentiation maintained. Unremarkable
appearance of the ventricular system.

Vascular: Unremarkable.

Skull: No acute fracture.  No aggressive bone lesion identified.

Sinuses/Orbits: Frothy material within the right maxillary sinus.
Sphenoid sinuses clear. Left maxillary sinus clear. Ethmoid air
cells and frontal sinuses are clear. No mastoid effusion.

Unremarkable appearance of the orbits.

Other: None
IMPRESSION: Negative head CT for acute intracranial abnormality.

Right maxillary sinus disease.

## 2018-09-03 NOTE — Progress Notes (Signed)
Reviewed and agree with assessment/plan.   Katerin Negrete, MD [place] [facility] 03/13/2016, 12:24 PM Pager:  [phone]  

## 2018-09-04 ENCOUNTER — Other Ambulatory Visit: Payer: Self-pay | Admitting: Allergy and Immunology

## 2018-09-04 DIAGNOSIS — R569 Unspecified convulsions: Secondary | ICD-10-CM | POA: Diagnosis not present

## 2018-09-04 DIAGNOSIS — H538 Other visual disturbances: Secondary | ICD-10-CM | POA: Diagnosis not present

## 2018-09-04 DIAGNOSIS — H5210 Myopia, unspecified eye: Secondary | ICD-10-CM | POA: Diagnosis not present

## 2018-09-07 ENCOUNTER — Other Ambulatory Visit (INDEPENDENT_AMBULATORY_CARE_PROVIDER_SITE_OTHER): Payer: Self-pay | Admitting: Family

## 2018-09-07 DIAGNOSIS — G40309 Generalized idiopathic epilepsy and epileptic syndromes, not intractable, without status epilepticus: Secondary | ICD-10-CM

## 2018-09-07 DIAGNOSIS — G4700 Insomnia, unspecified: Secondary | ICD-10-CM

## 2018-09-07 DIAGNOSIS — G40209 Localization-related (focal) (partial) symptomatic epilepsy and epileptic syndromes with complex partial seizures, not intractable, without status epilepticus: Secondary | ICD-10-CM

## 2018-09-07 MED ORDER — TRAZODONE HCL 50 MG PO TABS: 100.0000 mg | ORAL_TABLET | Freq: Every day | ORAL | 5 refills | Status: DC

## 2018-09-07 MED ORDER — DEPAKOTE 500 MG PO TBEC: DELAYED_RELEASE_TABLET | ORAL | 5 refills | Status: DC

## 2018-09-07 NOTE — Telephone Encounter (Signed)
Please send to the pharmacy °

## 2018-09-08 ENCOUNTER — Ambulatory Visit: Payer: Medicare Other | Admitting: Physical Therapy

## 2018-09-08 ENCOUNTER — Other Ambulatory Visit: Payer: Self-pay

## 2018-09-08 ENCOUNTER — Encounter (HOSPITAL_COMMUNITY): Payer: Self-pay

## 2018-09-08 ENCOUNTER — Emergency Department (HOSPITAL_COMMUNITY)
Admission: EM | Admit: 2018-09-08 | Discharge: 2018-09-09 | Disposition: A | Discharge status: Home / Self Care | Payer: Medicare Other | Attending: Emergency Medicine | Admitting: Emergency Medicine

## 2018-09-08 DIAGNOSIS — R531 Weakness: Secondary | ICD-10-CM | POA: Insufficient documentation

## 2018-09-08 DIAGNOSIS — R2681 Unsteadiness on feet: Secondary | ICD-10-CM

## 2018-09-08 DIAGNOSIS — R2689 Other abnormalities of gait and mobility: Secondary | ICD-10-CM

## 2018-09-08 DIAGNOSIS — Z79899 Other long term (current) drug therapy: Secondary | ICD-10-CM | POA: Diagnosis not present

## 2018-09-08 DIAGNOSIS — F70 Mild intellectual disabilities: Secondary | ICD-10-CM | POA: Insufficient documentation

## 2018-09-08 DIAGNOSIS — J90 Pleural effusion, not elsewhere classified: Secondary | ICD-10-CM | POA: Diagnosis not present

## 2018-09-08 DIAGNOSIS — J45909 Unspecified asthma, uncomplicated: Secondary | ICD-10-CM | POA: Insufficient documentation

## 2018-09-08 DIAGNOSIS — R918 Other nonspecific abnormal finding of lung field: Secondary | ICD-10-CM | POA: Diagnosis not present

## 2018-09-08 DIAGNOSIS — F840 Autistic disorder: Secondary | ICD-10-CM | POA: Insufficient documentation

## 2018-09-08 DIAGNOSIS — R112 Nausea with vomiting, unspecified: Secondary | ICD-10-CM | POA: Diagnosis not present

## 2018-09-08 DIAGNOSIS — R1110 Vomiting, unspecified: Secondary | ICD-10-CM | POA: Diagnosis not present

## 2018-09-08 DIAGNOSIS — M6281 Muscle weakness (generalized): Secondary | ICD-10-CM

## 2018-09-08 NOTE — ED Triage Notes (Signed)
Per patient family member, she has been diagnosed with a UTI and told to come to the ED. Stating she was not acting per her normal at the doctors office. Pts mother stating when she has a UTI she gets seizures and its concerned she will have one.

## 2018-09-09 ENCOUNTER — Emergency Department (HOSPITAL_COMMUNITY): Payer: Medicare Other

## 2018-09-09 ENCOUNTER — Emergency Department (HOSPITAL_COMMUNITY)
Admission: EM | Admit: 2018-09-09 | Discharge: 2018-09-10 | Disposition: A | Discharge status: Home / Self Care | Payer: Medicare Other | Source: Home / Self Care | Attending: Emergency Medicine | Admitting: Emergency Medicine

## 2018-09-09 ENCOUNTER — Encounter (HOSPITAL_COMMUNITY): Payer: Self-pay | Admitting: Emergency Medicine

## 2018-09-09 ENCOUNTER — Other Ambulatory Visit: Payer: Self-pay

## 2018-09-09 DIAGNOSIS — F840 Autistic disorder: Secondary | ICD-10-CM | POA: Insufficient documentation

## 2018-09-09 DIAGNOSIS — Z79899 Other long term (current) drug therapy: Secondary | ICD-10-CM | POA: Insufficient documentation

## 2018-09-09 DIAGNOSIS — R112 Nausea with vomiting, unspecified: Secondary | ICD-10-CM

## 2018-09-09 DIAGNOSIS — J90 Pleural effusion, not elsewhere classified: Secondary | ICD-10-CM | POA: Diagnosis not present

## 2018-09-09 DIAGNOSIS — R531 Weakness: Secondary | ICD-10-CM | POA: Diagnosis not present

## 2018-09-09 DIAGNOSIS — R918 Other nonspecific abnormal finding of lung field: Secondary | ICD-10-CM | POA: Diagnosis not present

## 2018-09-09 DIAGNOSIS — E099 Drug or chemical induced diabetes mellitus without complications: Secondary | ICD-10-CM | POA: Insufficient documentation

## 2018-09-09 DIAGNOSIS — J45909 Unspecified asthma, uncomplicated: Secondary | ICD-10-CM | POA: Insufficient documentation

## 2018-09-09 LAB — CBC WITH DIFFERENTIAL/PLATELET
Abs Immature Granulocytes: 0.16 10*3/uL — ABNORMAL HIGH (ref 0.00–0.07)
Basophils Absolute: 0.1 10*3/uL (ref 0.0–0.1)
Basophils Relative: 0 %
Eosinophils Absolute: 0 10*3/uL (ref 0.0–0.5)
Eosinophils Relative: 0 %
HCT: 36.1 % (ref 36.0–46.0)
Hemoglobin: 11.6 g/dL — ABNORMAL LOW (ref 12.0–15.0)
Immature Granulocytes: 1 %
Lymphocytes Relative: 41 %
Lymphs Abs: 4.8 10*3/uL — ABNORMAL HIGH (ref 0.7–4.0)
MCH: 30.9 pg (ref 26.0–34.0)
MCHC: 32.1 g/dL (ref 30.0–36.0)
MCV: 96 fL (ref 80.0–100.0)
Monocytes Absolute: 1.2 10*3/uL — ABNORMAL HIGH (ref 0.1–1.0)
Monocytes Relative: 10 %
Neutro Abs: 5.4 10*3/uL (ref 1.7–7.7)
Neutrophils Relative %: 48 %
Platelets: 241 10*3/uL (ref 150–400)
RBC: 3.76 MIL/uL — ABNORMAL LOW (ref 3.87–5.11)
RDW: 13.3 % (ref 11.5–15.5)
WBC: 11.6 10*3/uL — ABNORMAL HIGH (ref 4.0–10.5)
nRBC: 0 % (ref 0.0–0.2)

## 2018-09-09 LAB — BASIC METABOLIC PANEL
Anion gap: 7 (ref 5–15)
BUN: 14 mg/dL (ref 6–20)
CO2: 23 mmol/L (ref 22–32)
Calcium: 8.2 mg/dL — ABNORMAL LOW (ref 8.9–10.3)
Chloride: 108 mmol/L (ref 98–111)
Creatinine, Ser: 0.71 mg/dL (ref 0.44–1.00)
GFR calc Af Amer: >60 mL/min (ref >60)
GFR calc non Af Amer: >60 mL/min (ref >60)
Glucose, Bld: 112 mg/dL — ABNORMAL HIGH (ref 70–99)
Potassium: 3.6 mmol/L (ref 3.5–5.1)
Sodium: 138 mmol/L (ref 135–145)

## 2018-09-09 LAB — URINALYSIS, ROUTINE W REFLEX MICROSCOPIC
Bilirubin Urine: NEGATIVE
Bilirubin Urine: NEGATIVE
Glucose, UA: NEGATIVE mg/dL
Glucose, UA: NEGATIVE mg/dL
Hgb urine dipstick: NEGATIVE
Hgb urine dipstick: NEGATIVE
Ketones, ur: 5 mg/dL — AB
Ketones, ur: 5 mg/dL — AB
Leukocytes,Ua: NEGATIVE
Nitrite: NEGATIVE
Nitrite: NEGATIVE
Protein, ur: NEGATIVE mg/dL
Protein, ur: NEGATIVE mg/dL
Specific Gravity, Urine: 1.023 (ref 1.005–1.030)
Specific Gravity, Urine: 1.028 (ref 1.005–1.030)
pH: 6 (ref 5.0–8.0)
pH: 5 (ref 5.0–8.0)

## 2018-09-09 LAB — LACTIC ACID, PLASMA: Lactic Acid, Venous: 0.9 mmol/L (ref 0.5–1.9)

## 2018-09-09 MED ORDER — SODIUM CHLORIDE 0.9 % IV BOLUS
1000.0000 mL | Freq: Once | INTRAVENOUS | Status: AC
  Administered 2018-09-10: 1000 mL via INTRAVENOUS

## 2018-09-09 NOTE — ED Provider Notes (Signed)
[facility]-EMERGENCY DEPT Provider Note   CSN: [id number] Arrival date & time: 09/08/18  2009    History   Chief Complaint Chief Complaint  Patient presents with   Urinary Tract Infection    HPI Lori Miles is a 23 y.o. female.     Brought to the emergency department by mother for evaluation of possible persistent UTI.  Patient recently diagnosed with a UTI by primary care doctor and placed on antibiotics.  Symptoms seem to be persistent, was seen in the ER a week ago.  At that time culture was reviewed and patient was switched to Levaquin and Keflex which were appropriate for her pathogens.  Mother reports that she still has been behaving differently.  Today at therapy she was weak and refusing to walk.  Mother also reports that her urine is still dark and smells differently.     Past Medical History:  Diagnosis Date   Asthma    Autism    mom states no actual dx, but admits she has some symptoms   Bacteriuria    Chronic constipation    Cognitive developmental delay    Disruptive behavior disorder    Family history of adverse reaction to anesthesia    mother had itching after anesthesia one time   Frequency-urgency syndrome    Gait disorder    ORGANIC PER NERUOLGIST NOTE (DR HICKLING)   Generalized convulsive epilepsy (HCC) NEUROLOGIST-  DR HICKLING   GERD (gastroesophageal reflux disease)    Gout    H/O sinus tachycardia    History of acute respiratory failure 02/14/2015   SECONDARY TO CAP   History of recurrent UTIs    Interstitial cystitis    Moderate intellectual disability    OSA (obstructive sleep apnea)    no machine yet    Partial epilepsy with impairment of consciousness (HCC)    FOLLOWED BY DR HICKLING   Pneumonia 2018 last time   PONV (postoperative nausea and vomiting)    Seizure (HCC)    most recent sz " at the end of summer"-last sz years ago per mother   Urinary incontinence     Patient Active  Problem List   Diagnosis Date Noted   Glucosuria 06/01/2018   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   Not well controlled moderate persistent asthma 04/29/2018   Gastroesophageal reflux disease 04/29/2018   Recurrent infections 04/29/2018   Dysuria 02/20/2018   Chronic rhinitis 04/03/2017   Recurrent falls 03/21/2017   Weakness 03/21/2017   Venous insufficiency 03/21/2017   Excessive daytime sleepiness 03/05/2016   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   Acanthosis nigricans 11/14/2014   Toeing-in 08/29/2014   Generalized convulsive epilepsy (HCC) 09/28/2012   Partial epilepsy with impairment of consciousness (HCC) 09/28/2012   Cognitive developmental delay 09/28/2012   Recurrent urinary tract infection 08/01/2011   Asthma 08/01/2011   Chronic constipation 08/01/2011   Contraception 08/01/2011    Past Surgical History:  Procedure Laterality Date   CYSTOSCOPY N/A 06/03/2017   Procedure: CYSTOSCOPY WITH EXAM UNDER ANESTHESIA;  Surgeon: Jerilee Field, MD;  Location: [facility];  Service: Urology;  Laterality: N/A;   CYSTOSCOPY WITH HYDRODISTENSION AND BIOPSY  04/14/2012   Procedure: CYSTOSCOPY/BIOPSY/HYDRODISTENSION;  Surgeon: Hanley Ben, MD;  Location: [facility];  Service: Urology;;  instillation of marcaine and pyridium   EUA/  PAP SMEAR/  BREAST EXAM  03-12-2016   dr Ihor Dow [facility]   EUA/ VAGINOSCOPY/ COLPOSCOPY FO THE HYMENAL RING  10-04-2009    dr Delsa Sale  [place]       OB History   No obstetric history on file.      Home Medications    Prior to Admission medications   Medication  Sig Start Date End Date Taking? Authorizing Provider  acetaminophen (TYLENOL) 325 MG tablet Take 2 tablets (650 mg total) by mouth every 6 (six) hours as needed for mild pain or moderate pain. 02/27/17  Yes Waynetta Pean, PA-C  albuterol (PROVENTIL) (2.5 MG/3ML) 0.083% nebulizer solution INHALE CONTENTS OF 1 VIAL IN NEBULIZER EVERY 4 TO 6 HOURS IF NEEDED FOR COUGH OR WHEEZING Patient taking differently: Take 2.5 mg by nebulization every 4 (four) hours as needed for wheezing or shortness of breath.  03/17/18  Yes Kozlow, Donnamarie Poag, MD  benzonatate (TESSALON) 200 MG capsule TAKE 1 CAPSULE(200 MG) BY MOUTH THREE TIMES DAILY AS NEEDED Patient taking differently: Take 200 mg by mouth 3 (three) times daily as needed for cough.  08/26/18  Yes Hoyt Koch, MD  budesonide (PULMICORT) 1 MG/2ML nebulizer solution INHALE 1 VIAL BY NEBULIZATION 4 TIMES A DAY. DURING UPPER RESPIRATORY INFECTIONS AND ASTHMA FLARES FOR 1-2 WEEKS Patient taking differently: Take 1 mg by nebulization 4 (four) times daily.  09/07/18  Yes Kozlow, Donnamarie Poag, MD  cephALEXin (KEFLEX) 500 MG capsule Take 1 capsule (500 mg total) by mouth 3 (three) times daily. 0/25/85  Yes Delora Fuel, MD  cetirizine (ZYRTEC) 10 MG tablet TAKE 1 TABLET(10 MG) BY MOUTH DAILY Patient taking differently: Take 10 mg by mouth daily.  07/08/18  Yes Kozlow, Donnamarie Poag, MD  clotrimazole-betamethasone (LOTRISONE) cream Apply to affected area 2 times daily prn Patient taking differently: Apply 1 application topically daily.  06/29/18  Yes Robyn Haber, MD  DEPAKOTE 500 MG DR tablet TAKE 1 TABLET BY MOUTH IN THE MORNING AND 2 AT NIGHT Patient taking differently: Take 500-1,000 mg by mouth See admin instructions. Take 500 mg by mouth in the morning and 1000 mg by mouth at bedtime 09/07/18  Yes Goodpasture, Otila Kluver, NP  famotidine (PEPCID) 20 MG tablet TAKE 1 TABLET BY MOUTH EVERY 12 HOURS. DISCONTINUE RANITIDINE Patient taking differently: Take 20 mg by mouth 2 (two)  times daily.  07/08/18  Yes Pyrtle, Lajuan Lines, MD  fluconazole (DIFLUCAN) 150 MG tablet Take 150 mg by mouth daily as needed (yeast infection).   Yes [provider]  Levonorgestrel-Ethinyl Estradiol (SEASONIQUE) 0.15-0.03 &0.01 MG tablet Take 1 tablet by mouth daily. 01/29/18  Yes Lajean Manes, CNM  Melatonin 3 MG TABS Take 3 mg by mouth at bedtime.    Yes [provider]  montelukast (SINGULAIR) 10 MG tablet TAKE 1 TABLET(10 MG) BY MOUTH AT BEDTIME Patient taking differently: Take 10 mg by mouth daily at 3 pm.  07/08/18  Yes Kozlow, Donnamarie Poag, MD  ondansetron (ZOFRAN ODT) 4 MG disintegrating tablet Take 1 tablet (4 mg total) by mouth every 8 (eight) hours as needed for nausea. Patient taking differently: Take 8 mg by mouth every 8 (eight) hours as needed for nausea.  06/07/18  Yes Larene Pickett, PA-C  PERFOROMIST 20 MCG/2ML nebulizer solution INHALE 2 ML VIA NEBULIZER TWICE DAILY Patient taking differently: Take 20  mcg by nebulization 2 (two) times daily.  09/07/18  Yes Kozlow, Alvira PhilipsEric J, MD  polyethylene glycol powder (GLYCOLAX/MIRALAX) powder Dissolve 17 grasm in at least 8 ounces water/juice and drink by mouth twice daily Patient taking differently: Take 17 g by mouth 2 (two) times a day. Dissolve in at least 8 ounces of water/juice and drink 02/18/18  Yes Pyrtle, Carie CaddyJay M, MD  Revefenacin (YUPELRI) 175 MCG/3ML SOLN Inhale 1 vial into the lungs daily. 03/19/18  Yes Kozlow, Alvira PhilipsEric J, MD  senna (SENOKOT) 8.6 MG tablet Take 1 tablet by mouth at bedtime.    Yes [provider]  traZODone (DESYREL) 50 MG tablet Take 2 tablets (100 mg total) by mouth at bedtime. 09/07/18  Yes Elveria RisingGoodpasture, Tina, NP  levofloxacin (LEVAQUIN) 750 MG tablet Take 1 tablet (750 mg total) by mouth daily. Patient not taking: Reported on 09/09/2018 08/21/18   Roxy HorsemanBrowning, Robert, PA-C  lubiprostone (AMITIZA) 8 MCG capsule Take 1 capsule (8 mcg total) by mouth 2 (two) times daily with a meal. Patient not taking: Reported  on 09/09/2018 08/27/18   Pyrtle, Carie CaddyJay M, MD  mometasone (NASONEX) 50 MCG/ACT nasal spray Place 2 sprays into the nose daily. Patient not taking: Reported on 09/09/2018 03/17/18   Jessica PriestKozlow, Eric J, MD  Nebulizer MISC Adult mask with tubing. Use as directed with nebulizer device. 07/20/18   Kozlow, Alvira PhilipsEric J, MD    Family History Family History  Problem Relation Age of Onset   Seizures Mother    Seizures Sister        1 sister has   Pancreatic cancer Sister    Colon cancer Neg Hx    Colon polyps Neg Hx    Esophageal cancer Neg Hx    Rectal cancer Neg Hx    Stomach cancer Neg Hx     Social History Social History   Tobacco Use   Smoking status: Never Smoker   Smokeless tobacco: Never Used  Substance Use Topics   Alcohol use: No    Alcohol/week: 0.0 standard drinks   Drug use: No     Allergies   Abilify [aripiprazole], Seroquel [quetiapine fumarate], Bactrim [sulfamethoxazole-trimethoprim], Doxycycline, Amoxicillin-pot clavulanate, and Keppra [levetiracetam]   Review of Systems Review of Systems  Constitutional: Positive for fatigue.  All other systems reviewed and are negative.    Physical Exam Updated Vital Signs BP 100/66    Pulse 86    Temp 98.5 F (36.9 C) (Oral)    Resp (!) 22    SpO2 98%   Physical Exam Vitals signs and nursing note reviewed.  Constitutional:      General: She is not in acute distress.    Appearance: Normal appearance. She is well-developed.  HENT:     Head: Normocephalic and atraumatic.     Right Ear: Hearing normal.     Left Ear: Hearing normal.     Nose: Nose normal.  Eyes:     Conjunctiva/sclera: Conjunctivae normal.     Pupils: Pupils are equal, round, and reactive to light.  Neck:     Musculoskeletal: Normal range of motion and neck supple.  Cardiovascular:     Rate and Rhythm: Regular rhythm.     Heart sounds: S1 normal and S2 normal. No murmur. No friction rub. No gallop.   Pulmonary:     Effort: Pulmonary effort is  normal. No respiratory distress.     Breath sounds: Normal breath sounds.  Chest:     Chest wall: No tenderness.  Abdominal:  General: Bowel sounds are normal.     Palpations: Abdomen is soft.     Tenderness: There is no abdominal tenderness. There is no guarding or rebound. Negative signs include Murphy's sign and McBurney's sign.     Hernia: No hernia is present.  Musculoskeletal: Normal range of motion.  Skin:    General: Skin is warm and dry.     Findings: No rash.  Neurological:     Mental Status: She is alert.     GCS: GCS eye subscore is 4. GCS verbal subscore is 5. GCS motor subscore is 6.     Cranial Nerves: No cranial nerve deficit.     Sensory: No sensory deficit.     Coordination: Coordination normal.      ED Treatments / Results  Labs (all labs ordered are listed, but only abnormal results are displayed) Labs Reviewed  URINALYSIS, ROUTINE W REFLEX MICROSCOPIC - Abnormal; Notable for the following components:      Result Value   Ketones, ur 5 (*)    All other components within normal limits  CBC WITH DIFFERENTIAL/PLATELET - Abnormal; Notable for the following components:   WBC 11.6 (*)    RBC 3.76 (*)    Hemoglobin 11.6 (*)    Lymphs Abs 4.8 (*)    Monocytes Absolute 1.2 (*)    Abs Immature Granulocytes 0.16 (*)    All other components within normal limits  BASIC METABOLIC PANEL - Abnormal; Notable for the following components:   Glucose, Bld 112 (*)    Calcium 8.2 (*)    All other components within normal limits  URINE CULTURE  LACTIC ACID, PLASMA    EKG None  Radiology Dg Chest 2 View  Result Date: 09/09/2018 CLINICAL DATA:  Weakness EXAM: CHEST - 2 VIEW COMPARISON:  August 20, 2018 FINDINGS: The lung volumes are very low. There are small bilateral pleural effusions. There are bibasilar airspace opacities. There is no acute osseous abnormality. The heart size is stable from prior studies. There is no pneumothorax. IMPRESSION: Low lung volumes with  possible small bilateral pleural effusions and bibasilar airspace opacities which may represent atelectasis or infiltrate. Electronically Signed   By: Katherine Mantlehristopher  Green M.D.   On: 09/09/2018 00:52    Procedures Procedures (including critical care time)  Medications Ordered in ED Medications - No data to display   Initial Impression / Assessment and Plan / ED Course  I have reviewed the triage vital signs and the nursing notes.  Pertinent labs & imaging results that were available during my care of the patient were reviewed by me and considered in my medical decision making (see chart for details).       Patient brought to the emergency department because mother was concerned that the recently diagnosed and treated urinary tract infection had not cleared.  Mother was concerned because the patient's seemed like she was weak and not acting like herself at physical therapy earlier today.  She appears well here in the ER.  Examination is entirely unremarkable.  Work-up was also unremarkable.  When I review her records she did have antibiotics adjusted for her culture appropriately and repeat culture did not grow any pathogens.  Urinalysis today is completely normal, very reassuring.  Radiologic interpretation of chest x-ray is nonspecific.  I looked at the images and I do not feel that she has an active pneumonia.  She just finished Levaquin.  She is breathing normally with normal oxygen saturations.  Mother was reassured, urinary tract  infection has cleared, repeat cultures pending.  Follow-up with PCP.   Final Clinical Impressions(s) / ED Diagnoses   Final diagnoses:  Weakness    ED Discharge Orders    None       Christyann Manolis, Canary Brimhristopher J, MD 09/09/18 [phone]

## 2018-09-09 NOTE — ED Triage Notes (Signed)
Patient here from home with family member with complaints of UTI. Reports that she was seen last night for same with no relief. Reports that she needs "more meds".

## 2018-09-09 NOTE — ED Provider Notes (Signed)
[facility]-EMERGENCY DEPT Provider Note   CSN: [id number] Arrival date & time: 09/09/18  2018     History   Chief Complaint Chief Complaint  Patient presents with  . Urinary Tract Infection    HPI Lori Miles is a 23 y.o. female.     Level 5 caveat for autism nonverbal.  Patient here with mother with concern for recurrent UTI.  Seen yesterday for same.  Mother reports patient has had generalized weakness, not able to participate in physical therapy, nausea and vomiting since being seen yesterday.  She has not had a fever but has had some chills.  No documented temperature.  Mother reports patient finished antibiotics 2 days ago after prolonged course for UTI with both Keflex and Levaquin.  Last antibiotic dose was 2 days ago.  Mother reports intractable nausea and vomiting today with generalized weakness and chills.  Too weak to participate in therapy.  The history is provided by the patient and a relative.  Urinary Tract Infection   Past Medical History:  Diagnosis Date  . Asthma   . Autism    mom states no actual dx, but admits she has some symptoms  . Bacteriuria   . Chronic constipation   . Cognitive developmental delay   . Disruptive behavior disorder   . Family history of adverse reaction to anesthesia    mother had itching after anesthesia one time  . Frequency-urgency syndrome   . Gait disorder    ORGANIC PER NERUOLGIST NOTE (DR HICKLING)  . 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 GERD (gastroesophageal reflux disease)   . Gout   . H/O sinus tachycardia   . History of acute respiratory failure 02/14/2015   SECONDARY TO CAP  . History of recurrent UTIs   . Interstitial cystitis   . Moderate intellectual disability   . OSA (obstructive sleep apnea)    no machine yet   . Partial epilepsy with impairment of consciousness (HCC)    FOLLOWED BY DR HICKLING  . Pneumonia 2018 last time  . PONV (postoperative  nausea and vomiting)   . Seizure (HCC)    most recent sz " at the end of summer"-last sz years ago per mother  . Urinary incontinence     Patient Active Problem List   Diagnosis Date Noted  . Glucosuria 06/01/2018  . 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 Not well controlled moderate persistent asthma 04/29/2018  . Gastroesophageal reflux disease 04/29/2018  . Recurrent infections 04/29/2018  . Dysuria 02/20/2018  . Chronic rhinitis 04/03/2017  . Recurrent falls 03/21/2017  . Weakness 03/21/2017  . Venous insufficiency 03/21/2017  . Excessive daytime sleepiness 03/05/2016  . 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 Cough variant asthma vs UACS/ vcd  03/15/2015  . Autism spectrum disorder 02/14/2015  . Nausea with vomiting 02/14/2015  . Obesity, morbid (HCC) 12/13/2014  . 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 Acanthosis nigricans 11/14/2014  . Toeing-in 08/29/2014  . 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 Recurrent urinary tract infection 08/01/2011  . Asthma 08/01/2011  . Chronic constipation 08/01/2011  . Contraception 08/01/2011    Past Surgical History:  Procedure Laterality Date  . CYSTOSCOPY N/A 06/03/2017   Procedure: CYSTOSCOPY WITH EXAM UNDER ANESTHESIA;  Surgeon: Jerilee FieldEskridge, [name], MD;  [facility];  Service: Urology;  Laterality: N/A;  . CYSTOSCOPY WITH HYDRODISTENSION AND BIOPSY  04/14/2012   Procedure: CYSTOSCOPY/BIOPSY/HYDRODISTENSION;  Surgeon: Lindaann Slough, MD;  Location: [facility];  Service: Urology;;  instillation of marcaine and pyridium  . EUA/  PAP SMEAR/  BREAST EXAM  03-12-2016   dr Erin Fulling [facility]  . EUA/ VAGINOSCOPY/ COLPOSCOPY FO THE HYMENAL RING  10-04-2009    dr  Tamela Oddi  [facility]  . TONSILLECTOMY       OB History   No obstetric history on file.      Home Medications    Prior to Admission medications   Medication Sig Start Date End Date Taking? Authorizing Provider  acetaminophen (TYLENOL) 325 MG tablet Take 2 tablets (650 mg total) by mouth every 6 (six) hours as needed for mild pain or moderate pain. 02/27/17  Yes Everlene Farrier, PA-C  albuterol (PROVENTIL) (2.5 MG/3ML) 0.083% nebulizer solution INHALE CONTENTS OF 1 VIAL IN NEBULIZER EVERY 4 TO 6 HOURS IF NEEDED FOR COUGH OR WHEEZING Patient taking differently: Take 2.5 mg by nebulization every 4 (four) hours as needed for wheezing or shortness of breath.  03/17/18  Yes Kozlow, Alvira Philips, MD  benzonatate (TESSALON) 200 MG capsule TAKE 1 CAPSULE(200 MG) BY MOUTH THREE TIMES DAILY AS NEEDED Patient taking differently: Take 200 mg by mouth 3 (three) times daily as needed for cough.  08/26/18  Yes Myrlene Broker, MD  budesonide (PULMICORT) 1 MG/2ML nebulizer solution INHALE 1 VIAL BY NEBULIZATION 4 TIMES A DAY. DURING UPPER RESPIRATORY INFECTIONS AND ASTHMA FLARES FOR 1-2 WEEKS Patient taking differently: Take 1 mg by nebulization 4 (four) times daily.  09/07/18  Yes Kozlow, Alvira Philips, MD  cephALEXin (KEFLEX) 500 MG capsule Take 1 capsule (500 mg total) by mouth 3 (three) times daily. 08/26/18  Yes Dione Booze, MD  cetirizine (ZYRTEC) 10 MG tablet TAKE 1 TABLET(10 MG) BY MOUTH DAILY Patient taking differently: Take 10 mg by mouth daily.  07/08/18  Yes Kozlow, Alvira Philips, MD  clotrimazole-betamethasone (LOTRISONE) cream Apply to affected area 2 times daily prn Patient taking differently: Apply 1 application topically daily.  06/29/18  Yes Elvina Sidle, MD  DEPAKOTE 500 MG DR tablet TAKE 1 TABLET BY MOUTH IN THE MORNING AND 2 AT NIGHT Patient taking differently: Take 500-1,000 mg by mouth See admin instructions. Take 500 mg by mouth in the morning and 1000 mg by mouth at bedtime 09/07/18  Yes Goodpasture,  Inetta Fermo, NP  famotidine (PEPCID) 20 MG tablet TAKE 1 TABLET BY MOUTH EVERY 12 HOURS. DISCONTINUE RANITIDINE Patient taking differently: Take 20 mg by mouth 2 (two) times daily.  07/08/18  Yes Pyrtle, Carie Caddy, MD  fluconazole (DIFLUCAN) 150 MG tablet Take 150 mg by mouth daily as needed (yeast infection).   Yes [provider]  Levonorgestrel-Ethinyl Estradiol (SEASONIQUE) 0.15-0.03 &0.01 MG tablet Take 1 tablet by mouth daily. 01/29/18  Yes Sharyon Cable, CNM  Melatonin 3 MG TABS Take 3 mg by mouth at bedtime.    Yes [provider]  montelukast (SINGULAIR) 10 MG tablet TAKE 1 TABLET(10 MG) BY MOUTH AT BEDTIME Patient taking differently: Take 10 mg by mouth daily at 3 pm.  07/08/18  Yes Kozlow, Alvira Philips, MD  ondansetron (ZOFRAN ODT) 4 MG disintegrating tablet Take 1 tablet (4 mg total) by mouth every 8 (eight) hours as needed for nausea. Patient taking differently: Take 8 mg by mouth every 8 (eight) hours as needed for nausea.  06/07/18  Yes Garlon HatchetSanders, Lisa M, PA-C  PERFOROMIST 20 MCG/2ML nebulizer solution INHALE 2 ML VIA NEBULIZER TWICE DAILY Patient taking differently: Take 20 mcg by nebulization 2 (two) times daily.  09/07/18  Yes Kozlow, Alvira PhilipsEric J, MD  polyethylene glycol powder (GLYCOLAX/MIRALAX) powder Dissolve 17 grasm in at least 8 ounces water/juice and drink by mouth twice daily Patient taking differently: Take 17 g by mouth 2 (two) times a day. Dissolve in at least 8 ounces of water/juice and drink 02/18/18  Yes Pyrtle, Carie CaddyJay M, MD  Revefenacin (YUPELRI) 175 MCG/3ML SOLN Inhale 1 vial into the lungs daily. 03/19/18  Yes Kozlow, Alvira PhilipsEric J, MD  senna (SENOKOT) 8.6 MG tablet Take 1 tablet by mouth at bedtime.    Yes [provider]  traZODone (DESYREL) 50 MG tablet Take 2 tablets (100 mg total) by mouth at bedtime. 09/07/18  Yes Elveria RisingGoodpasture, Tina, NP  levofloxacin (LEVAQUIN) 750 MG tablet Take 1 tablet (750 mg total) by mouth daily. Patient not taking: Reported on 09/09/2018 08/21/18    Roxy HorsemanBrowning, Robert, PA-C  lubiprostone (AMITIZA) 8 MCG capsule Take 1 capsule (8 mcg total) by mouth 2 (two) times daily with a meal. Patient not taking: Reported on 09/09/2018 08/27/18   Pyrtle, Carie CaddyJay M, MD  mometasone (NASONEX) 50 MCG/ACT nasal spray Place 2 sprays into the nose daily. Patient not taking: Reported on 09/09/2018 03/17/18   Jessica PriestKozlow, Eric J, MD  Nebulizer MISC Adult mask with tubing. Use as directed with nebulizer device. 07/20/18   Kozlow, Alvira PhilipsEric J, MD    Family History Family History  Problem Relation Age of Onset  . Seizures Mother   . Seizures Sister        1 sister has  . Pancreatic cancer Sister   . Colon cancer Neg Hx   . Colon polyps Neg Hx   . Esophageal cancer Neg Hx   . Rectal cancer Neg Hx   . Stomach cancer Neg Hx     Social History Social History   Tobacco Use  . Smoking status: Never Smoker  . Smokeless tobacco: Never Used  Substance Use Topics  . Alcohol use: No    Alcohol/week: 0.0 standard drinks  . Drug use: No     Allergies   Abilify [aripiprazole], Seroquel [quetiapine fumarate], Bactrim [sulfamethoxazole-trimethoprim], Doxycycline, Amoxicillin-pot clavulanate, and Keppra [levetiracetam]   Review of Systems Review of Systems  Unable to perform ROS: Patient nonverbal     Physical Exam Updated Vital Signs BP (!) 102/58 (BP Location: Left Arm)   Pulse 92   Temp 99.2 F (37.3 C) (Oral)   Resp 16   Ht 4' 9.5" (1.461 m)   Wt 83.9 kg   SpO2 98%   BMI 39.34 kg/m   Physical Exam Vitals signs and nursing note reviewed.  Constitutional:      General: She is not in acute distress.    Appearance: She is well-developed. She is obese. She is not ill-appearing.     Comments: Well-hydrated, smiling  HENT:     Head: Normocephalic and atraumatic.     Mouth/Throat:     Mouth: Mucous membranes are moist.     Pharynx: No oropharyngeal exudate.  Eyes:     Conjunctiva/sclera: Conjunctivae normal.     Pupils: Pupils are equal, round, and reactive  to light.  Neck:     Musculoskeletal: Normal range of motion and neck supple.     Comments: No meningismus. Cardiovascular:     Rate and Rhythm: Normal rate and regular rhythm.  Heart sounds: Normal heart sounds. No murmur.  Pulmonary:     Effort: Pulmonary effort is normal. No respiratory distress.     Breath sounds: Normal breath sounds.  Abdominal:     Palpations: Abdomen is soft.     Tenderness: There is no abdominal tenderness. There is no guarding or rebound.  Musculoskeletal: Normal range of motion.        General: No tenderness.  Skin:    General: Skin is warm.     Capillary Refill: Capillary refill takes less than 2 seconds.  Neurological:     General: No focal deficit present.     Mental Status: She is alert.     Cranial Nerves: No cranial nerve deficit.     Motor: No abnormal muscle tone.     Coordination: Coordination normal.     Comments: Moves all extremities equally, no focal deficits Does not follow commands consistently.  Psychiatric:        Behavior: Behavior normal.      ED Treatments / Results  Labs (all labs ordered are listed, but only abnormal results are displayed) Labs Reviewed  URINALYSIS, ROUTINE W REFLEX MICROSCOPIC - Abnormal; Notable for the following components:      Result Value   Ketones, ur 5 (*)    Leukocytes,Ua TRACE (*)    Bacteria, UA RARE (*)    All other components within normal limits  VALPROIC ACID LEVEL - Abnormal; Notable for the following components:   Valproic Acid Lvl 103 (*)    All other components within normal limits  CBC WITH DIFFERENTIAL/PLATELET - Abnormal; Notable for the following components:   WBC 12.0 (*)    RBC 3.79 (*)    Hemoglobin 11.8 (*)    Lymphs Abs 5.2 (*)    Monocytes Absolute 1.3 (*)    Abs Immature Granulocytes 0.15 (*)    All other components within normal limits  COMPREHENSIVE METABOLIC PANEL - Abnormal; Notable for the following components:   Glucose, Bld 103 (*)    Calcium 8.8 (*)     Total Protein 6.4 (*)    Albumin 3.1 (*)    Alkaline Phosphatase 37 (*)    Total Bilirubin 0.2 (*)    All other components within normal limits  AMMONIA - Abnormal; Notable for the following components:   Ammonia 48 (*)    All other components within normal limits  CULTURE, BLOOD (ROUTINE X 2)  CULTURE, BLOOD (ROUTINE X 2)  URINE CULTURE  LACTIC ACID, PLASMA  LACTIC ACID, PLASMA  I-STAT BETA HCG BLOOD, ED ([facility], [facility], [facility] ONLY)    EKG    Radiology Dg Chest 2 View  Result Date: 09/09/2018 CLINICAL DATA:  Weakness EXAM: CHEST - 2 VIEW COMPARISON:  August 20, 2018 FINDINGS: The lung volumes are very low. There are small bilateral pleural effusions. There are bibasilar airspace opacities. There is no acute osseous abnormality. The heart size is stable from prior studies. There is no pneumothorax. IMPRESSION: Low lung volumes with possible small bilateral pleural effusions and bibasilar airspace opacities which may represent atelectasis or infiltrate. Electronically Signed   By: Katherine Mantlehristopher  Green M.D.   On: 09/09/2018 00:52   Dg Abdomen Acute W/chest  Result Date: 09/10/2018 CLINICAL DATA:  23 year old female with vomiting. EXAM: DG ABDOMEN ACUTE W/ 1V CHEST COMPARISON:  Chest radiograph dated 09/09/2018 FINDINGS: Evaluation is limited due to body habitus. Hazy density over the right lung most likely soft tissue attenuation. No focal consolidation, pleural effusion, or pneumothorax. The  cardiac silhouette is within normal limits. No bowel dilatation or evidence of obstruction. No free air. The osseous structures are grossly unremarkable. IMPRESSION: Negative abdominal radiographs. No acute cardiopulmonary disease. Electronically Signed   By: Anner Crete M.D.   On: 09/10/2018 01:32    Procedures Procedures (including critical care time)  Medications Ordered in ED Medications  sodium chloride 0.9 % bolus 1,000 mL (has no administration in time range)     Initial Impression / Assessment  and Plan / ED Course  I have reviewed the triage vital signs and the nursing notes.  Pertinent labs & imaging results that were available during my care of the patient were reviewed by me and considered in my medical decision making (see chart for details).       Patient here with concern for recurrent UTI, nausea, vomiting, generalized weakness and behavior change.  Mother reassured with a urinalysis today and yesterday are both negative appearing and culture is pending.  Culture from June 4 grew Proteus as well as Klebsiella.  Culture from June 10 with insignificant growth.  Culture from yesterday is negative so far.   work-up is reassuring.  Depakote level slightly elevated at 103.  Will check ammonia.  CBC and chemistries are normal.  Abdominal series is negative.  Ammonia mildly elevated at 48.  Will have patient hold her Depakote doses for the next 2 days.  Discussed with mother there is no indication of UTI at this point.  Cultures are pending.  Patient tolerating p.o. with a soft abdomen.  Chest x-ray is negative for pneumonia.  She has no respiratory distress no hypoxia.  No cough.  Patient tolerating p.o. and ambulatory.  Mother reassured that urinary tract infection has cleared and repeat cultures are pending.  Follow-up with her doctor.  Return precautions discussed.  Final Clinical Impressions(s) / ED Diagnoses   Final diagnoses:  Non-intractable vomiting with nausea, unspecified vomiting type    ED Discharge Orders    None       Shikira Folino, Annie Main, MD 09/10/18 0430

## 2018-09-09 NOTE — Therapy (Signed)
[facility] [address] [place], [place], [address] Phone: [phone]   Fax:  [phone]  Physical Therapy Treatment  Patient Details  Name: Lori Miles MRN: [id number] Date of Birth: 1995/06/13 Referring Provider (PT): Rockwell Germany, NP CLINIC OPERATION CHANGES: [facility]patient [facility] is open at lower capacity following universal masking, social distancing, and patient screening.  The patient's COVID risk of complications score is 3.  Encounter Date: 09/08/2018  PT End of Session - 09/09/18 2112    Visit Number  10    Number of Visits  11    Date for PT Re-Evaluation  09/15/18    Authorization Type  [employer] Medicare    Authorization Time Period  04-14-18 - 07-11-18; 08-03-18 - 10-16-18    PT Start Time  0755    PT Stop Time  0843    PT Time Calculation (min)  48 min    Activity Tolerance  Patient tolerated treatment well   c/o not feeling well - mother states she thinks pt has UTI   Behavior During Therapy  [facility] for tasks assessed/performed       Past Medical History:  Diagnosis Date  . Asthma   . Autism    mom states no actual dx, but admits she has some symptoms  . Bacteriuria   . Chronic constipation   . Cognitive developmental delay   . Disruptive behavior disorder   . Family history of adverse reaction to anesthesia    mother had itching after anesthesia one time  . Frequency-urgency syndrome   . Gait disorder    ORGANIC PER NERUOLGIST NOTE (DR HICKLING)  . Generalized convulsive epilepsy ([place]) NEUROLOGIST-  DR HICKLING  . GERD (gastroesophageal reflux disease)   . Gout   . H/O sinus tachycardia   . History of acute respiratory failure 02/14/2015   SECONDARY TO CAP  . History of recurrent UTIs   . Interstitial cystitis   . Moderate intellectual disability   . OSA (obstructive sleep apnea)    no machine yet   . Partial epilepsy with impairment of consciousness ([place])    FOLLOWED BY DR HICKLING   . Pneumonia 2018 last time  . PONV (postoperative nausea and vomiting)   . Seizure ([place])    most recent sz " at the end of summer"-last sz years ago per mother  . Urinary incontinence     Past Surgical History:  Procedure Laterality Date  . CYSTOSCOPY N/A 06/03/2017   Procedure: CYSTOSCOPY WITH EXAM UNDER ANESTHESIA;  Surgeon: Festus Aloe, MD;  Location: [facility]  Service: Urology;  Laterality: N/A;  . CYSTOSCOPY WITH HYDRODISTENSION AND BIOPSY  04/14/2012   Procedure: CYSTOSCOPY/BIOPSY/HYDRODISTENSION;  Surgeon: Hanley Ben, MD;  Location: [place];  Service: Urology;;  instillation of marcaine and pyridium  . EUA/  PAP SMEAR/  BREAST EXAM  03-12-2016   dr Ihor Dow [facility]  . EUA/ VAGINOSCOPY/ COLPOSCOPY FO THE HYMENAL RING  10-04-2009    dr Delsa Sale  [facility]  . TONSILLECTOMY      There were no vitals filed for this visit.  Subjective Assessment - 09/09/18 2107    Subjective  [name] reports pt has had a good week    Patient is accompained by:  Family member    Pertinent History  Cognitive developmentally delayed:  recurrent falls:  weakness:  tremor, unspecified:  generalized convulsive epilepsy;  autism spectrum disorder    Patient Stated Goals  improve walking and try to  stop the tremors    Currently in Pain?  Other (Comment)   pt points to left knee when asked if she has pain                      [facility] Adult PT Treatment/Exercise - 09/09/18 0001      Ambulation/Gait   Ambulation/Gait  Yes    Ambulation/Gait Assistance  4: Min guard    Ambulation Distance (Feet)  205 Feet    Gait Pattern  Step-through pattern   LLE internally rotates with foot flat contact at stance   Ambulation Surface  Level;Indoor    Stairs  Yes    Stairs Assistance  5: Supervision    Stair Management Technique  Two rails;Alternating pattern;Step to pattern   step to pattern with descension   Number of Stairs  4    Height of Stairs   6      Knee/Hip Exercises: Aerobic   Recumbent Bike  SciFit level 1.5 1" with rest period - then 5" additional with short intermittent rest periods       Knee/Hip Exercises: Machines for Strengthening   Cybex Leg Press  bil. LE's 40# 3 sets 10 reps      Knee/Hip Exercises: Standing   Heel Raises  Both;1 set;10 reps   with bil. UE support on rails at steps   Hip Flexion  Both;1 set;10 reps;Stengthening;Left   2# weight   Hip Abduction  Stengthening;Right;Left;1 set;10 reps;Knee straight   2# weight used on each leg   Hip Extension  Stengthening;Right;Left;1 set;10 reps;Knee straight   2# weight used on each leg                 PT Long Term Goals - 09/09/18 2118      PT LONG TERM GOAL #1   Title  Pt. will amb. 500' on flat, even surface without LOB.      Baseline  230' on 08-03-18 with hand held assist - pt able to amb. without assist    Time  4    Period  Weeks    Status  On-going      PT LONG TERM GOAL #2   Title  Pt will perform at least 10" on recumbent bike nonstop for improved endurance/activity tolerance.    Baseline  6 1/2" on 05-25-18;pt performed 5" on 08-03-18    Time  4    Period  Weeks    Status  On-going      PT LONG TERM GOAL #3   Title  Mother will verbalize understanding of HEP to assist with LE strengthening.      Baseline  met 05-25-18    Status  Achieved      PT LONG TERM GOAL #4   Title  Perform sit to stand 10 reps without UE support from mat to demo increased bil. LE strength.    Baseline  5 reps performed on 08-03-18    Time  4    Period  Weeks    Status  New            Plan - 09/09/18 2113    Clinical Impression Statement  Pt participated well for initial half of PT session today and then expressed pain in bilateral LE's, pointing to Rt knee, Lt knee and Lt lower leg when asked specifics about pain, etc. Pt turned Rt foot in (supination) in seated position with slight tremors noted in seated position; in standing pt had  knee  instability with LOB and was assisted back onto mat to prevent full LOB/potential fall.  Pt was able to ascend and descend steps after LOB episode; pt was transported to car by wheelchair due to her expressing inability to walk out - however, pt stood up from wheelchair and amb. 2-3 steps to get into car without difficulty.  Symptoms of tremors and instability appear to be partially behavioral in etiology.    Rehab Potential  Good    PT Frequency  1x / week    PT Duration  4 weeks    PT Next Visit Plan  cont LE strengthening; balance and gait training - D/C next session    Consulted and Agree with Plan of Care  Patient       Patient will benefit from skilled therapeutic intervention in order to improve the following deficits and impairments:  Abnormal gait, Decreased balance, Decreased strength, Decreased activity tolerance, Decreased endurance, Decreased safety awareness, Pain, Decreased coordination  Visit Diagnosis: 1. Other abnormalities of gait and mobility   2. Unsteadiness on feet   3. Muscle weakness (generalized)        Problem List Patient Active Problem List   Diagnosis Date Noted  . Glucosuria 06/01/2018  . Steroid-induced diabetes ([place]) 06/01/2018  . Not well controlled moderate persistent asthma 04/29/2018  . Gastroesophageal reflux disease 04/29/2018  . Recurrent infections 04/29/2018  . Dysuria 02/20/2018  . Chronic rhinitis 04/03/2017  . Recurrent falls 03/21/2017  . Weakness 03/21/2017  . Venous insufficiency 03/21/2017  . Excessive daytime sleepiness 03/05/2016  . 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 Cough variant asthma vs UACS/ vcd  03/15/2015  . Autism spectrum disorder 02/14/2015  . Nausea with vomiting 02/14/2015  . Obesity, morbid ([place]) 12/13/2014  . 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 Acanthosis nigricans 11/14/2014  . Toeing-in 08/29/2014   . Generalized convulsive epilepsy ([place]) 09/28/2012  . Partial epilepsy with impairment of consciousness ([place]) 09/28/2012  . 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 Recurrent urinary tract infection 08/01/2011  . Asthma 08/01/2011  . Chronic constipation 08/01/2011  . Contraception 08/01/2011    Alda Lea, [place] 09/09/2018, 9:20 PM  [facility] [address] [place], [place], [address] Phone: [phone]   Fax:  [phone]  Name: Lori Miles MRN: [id number] Date of Birth: 08-23-1995

## 2018-09-09 NOTE — ED Notes (Signed)
Patient transported to radiology

## 2018-09-09 NOTE — Discharge Instructions (Addendum)
Work-up today was reassuring.  It definitely appears that the urinary tract infection has cleared up.  We have repeated a culture, will let you know if anything changes.  Follow-up with your urologist.  Also follow-up with your primary care doctor.

## 2018-09-10 ENCOUNTER — Telehealth (INDEPENDENT_AMBULATORY_CARE_PROVIDER_SITE_OTHER): Payer: Self-pay | Admitting: Family

## 2018-09-10 ENCOUNTER — Other Ambulatory Visit (INDEPENDENT_AMBULATORY_CARE_PROVIDER_SITE_OTHER): Payer: Self-pay | Admitting: Family

## 2018-09-10 ENCOUNTER — Emergency Department (HOSPITAL_COMMUNITY): Payer: Medicare Other

## 2018-09-10 DIAGNOSIS — E7220 Disorder of urea cycle metabolism, unspecified: Secondary | ICD-10-CM

## 2018-09-10 DIAGNOSIS — R531 Weakness: Secondary | ICD-10-CM | POA: Diagnosis not present

## 2018-09-10 DIAGNOSIS — G40309 Generalized idiopathic epilepsy and epileptic syndromes, not intractable, without status epilepticus: Secondary | ICD-10-CM

## 2018-09-10 DIAGNOSIS — Z79899 Other long term (current) drug therapy: Secondary | ICD-10-CM

## 2018-09-10 DIAGNOSIS — R1110 Vomiting, unspecified: Secondary | ICD-10-CM | POA: Diagnosis not present

## 2018-09-10 DIAGNOSIS — R918 Other nonspecific abnormal finding of lung field: Secondary | ICD-10-CM | POA: Diagnosis not present

## 2018-09-10 LAB — CBC WITH DIFFERENTIAL/PLATELET
Abs Immature Granulocytes: 0.15 10*3/uL — ABNORMAL HIGH (ref 0.00–0.07)
Basophils Absolute: 0 10*3/uL (ref 0.0–0.1)
Basophils Relative: 0 %
Eosinophils Absolute: 0 10*3/uL (ref 0.0–0.5)
Eosinophils Relative: 0 %
HCT: 36.1 % (ref 36.0–46.0)
Hemoglobin: 11.8 g/dL — ABNORMAL LOW (ref 12.0–15.0)
Immature Granulocytes: 1 %
Lymphocytes Relative: 43 %
Lymphs Abs: 5.2 10*3/uL — ABNORMAL HIGH (ref 0.7–4.0)
MCH: 31.1 pg (ref 26.0–34.0)
MCHC: 32.7 g/dL (ref 30.0–36.0)
MCV: 95.3 fL (ref 80.0–100.0)
Monocytes Absolute: 1.3 10*3/uL — ABNORMAL HIGH (ref 0.1–1.0)
Monocytes Relative: 11 %
Neutro Abs: 5.3 10*3/uL (ref 1.7–7.7)
Neutrophils Relative %: 45 %
Platelets: 254 10*3/uL (ref 150–400)
RBC: 3.79 MIL/uL — ABNORMAL LOW (ref 3.87–5.11)
RDW: 13.4 % (ref 11.5–15.5)
WBC: 12 10*3/uL — ABNORMAL HIGH (ref 4.0–10.5)
nRBC: 0 % (ref 0.0–0.2)

## 2018-09-10 LAB — COMPREHENSIVE METABOLIC PANEL
ALT: 14 U/L (ref 0–44)
AST: 15 U/L (ref 15–41)
Albumin: 3.1 g/dL — ABNORMAL LOW (ref 3.5–5.0)
Alkaline Phosphatase: 37 U/L — ABNORMAL LOW (ref 38–126)
Anion gap: 8 (ref 5–15)
BUN: 14 mg/dL (ref 6–20)
CO2: 24 mmol/L (ref 22–32)
Calcium: 8.8 mg/dL — ABNORMAL LOW (ref 8.9–10.3)
Chloride: 107 mmol/L (ref 98–111)
Creatinine, Ser: 0.74 mg/dL (ref 0.44–1.00)
GFR calc Af Amer: >60 mL/min (ref >60)
GFR calc non Af Amer: >60 mL/min (ref >60)
Glucose, Bld: 103 mg/dL — ABNORMAL HIGH (ref 70–99)
Potassium: 3.8 mmol/L (ref 3.5–5.1)
Sodium: 139 mmol/L (ref 135–145)
Total Bilirubin: 0.2 mg/dL — ABNORMAL LOW (ref 0.3–1.2)
Total Protein: 6.4 g/dL — ABNORMAL LOW (ref 6.5–8.1)

## 2018-09-10 LAB — AMMONIA: Ammonia: 48 umol/L — ABNORMAL HIGH (ref 9–35)

## 2018-09-10 LAB — VALPROIC ACID LEVEL: Valproic Acid Lvl: 103 ug/mL — ABNORMAL HIGH (ref 50.0–100.0)

## 2018-09-10 LAB — I-STAT BETA HCG BLOOD, ED (MC, WL, AP ONLY): I-stat hCG, quantitative: <5 m[IU]/mL (ref <5)

## 2018-09-10 LAB — URINE CULTURE

## 2018-09-10 LAB — LACTIC ACID, PLASMA: Lactic Acid, Venous: 1 mmol/L (ref 0.5–1.9)

## 2018-09-10 MED ORDER — ONDANSETRON HCL 4 MG/2ML IJ SOLN
4.0000 mg | Freq: Once | INTRAMUSCULAR | Status: AC
  Administered 2018-09-10: 4 mg via INTRAVENOUS
  Filled 2018-09-10: qty 2

## 2018-09-10 NOTE — Telephone Encounter (Signed)
This is not significantly elevated for a person who is on Depakote.  The drug level is fine.  No changes should be made.  We discussed this.  Please reassure mother that the Depakote and ammonia level are not concerning and that we should leave things as they are particularly given that Wynell may have had a seizure.

## 2018-09-10 NOTE — Discharge Instructions (Signed)
Urinalysis today looks normal.  Her culture is pending from yesterday and today.  There is no indication for further antibiotics.  Your Depakote level is high.  Do not take it for 2 days and follow-up with your doctor for recheck of this level as well as the ammonia blood test. Return to the ED with fever, persistent vomiting, behavior change or any other concerns.

## 2018-09-10 NOTE — Telephone Encounter (Signed)
Mom [name] Sammon called to report that Hermelinda has been sick for 2 days with weakness, vomiting, and possible new UTI. Mom reports that she had a seizure prior to ER visit with full body shaking and gritting her teeth that lasted "a few minutes". The description of weakness was the same as in previous visits and Mom described vomiting as "spitting up".  At the ER last night, Mom was told that Shaunta's ammonia level was too high and that she should stop the Depakote for 2 days. Mom was concerned about that since Camden had recent seizure. In reviewing the chart, the Ammonia level was 48 mcg/ml and the non-trough Depakote level was 103 mcg/ml. A previous Ammonia level in January 2019 was 41 mcg/ml. I told Mom that I was uncomfortable with her stopping Depaakote, and to continue it for now. I recommended repeating Ammonia level tomorrow and Mom agreed to doing so. I asked her to let me know if Surina has more seizures. TG

## 2018-09-11 DIAGNOSIS — E7220 Disorder of urea cycle metabolism, unspecified: Secondary | ICD-10-CM | POA: Diagnosis not present

## 2018-09-11 DIAGNOSIS — Z79899 Other long term (current) drug therapy: Secondary | ICD-10-CM | POA: Diagnosis not present

## 2018-09-11 DIAGNOSIS — N390 Urinary tract infection, site not specified: Secondary | ICD-10-CM | POA: Diagnosis not present

## 2018-09-11 LAB — URINE CULTURE

## 2018-09-12 LAB — AMMONIA: Ammonia: 77 umol/L — ABNORMAL HIGH (ref <=72)

## 2018-09-14 IMAGING — DX DG CHEST 2V
2 series · 2 of 2 positions shown · non-contrast
Comparison: 03/13/2017

CLINICAL DATA: Cough and fever

EXAM:
CHEST  2 VIEW

[chest pa]
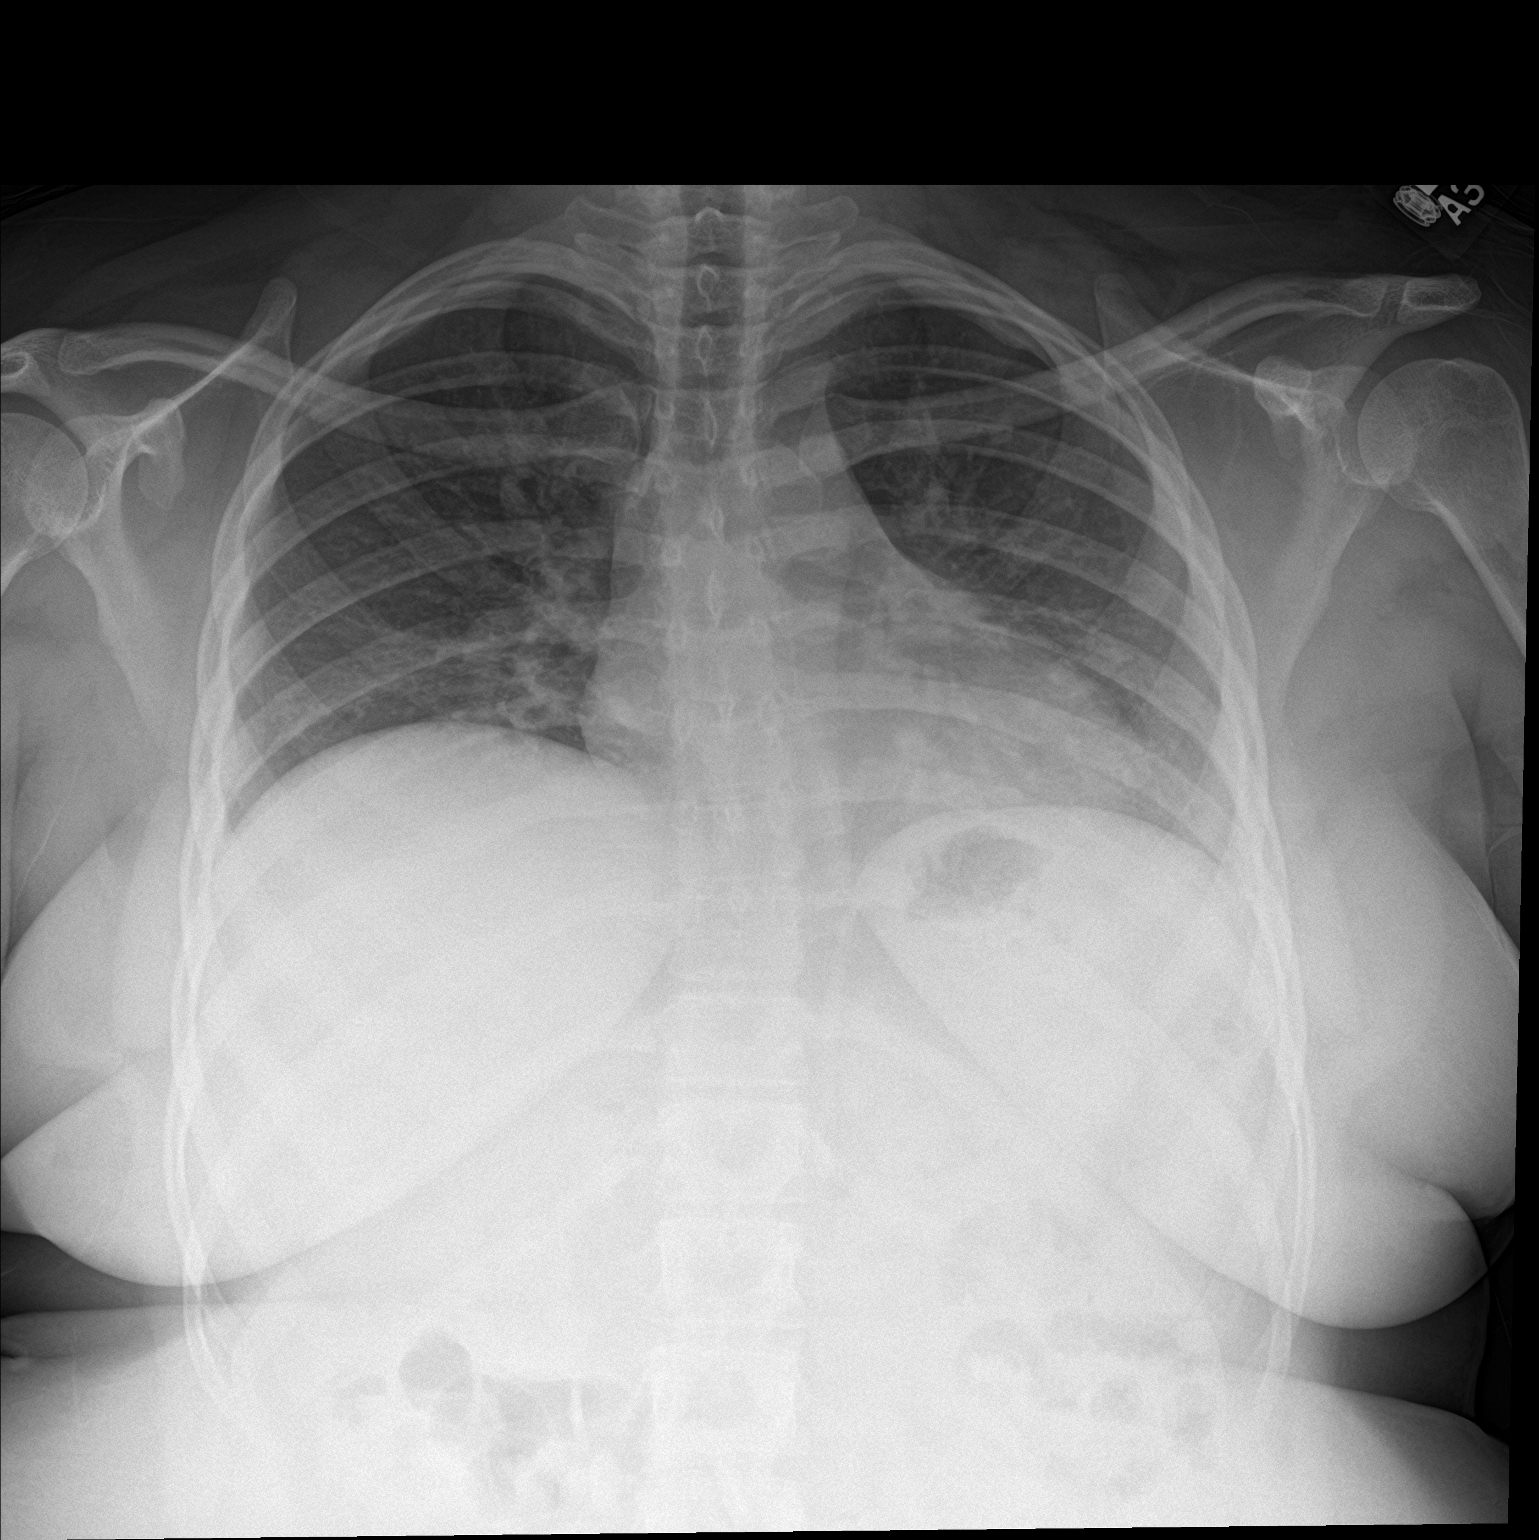

[chest lat]
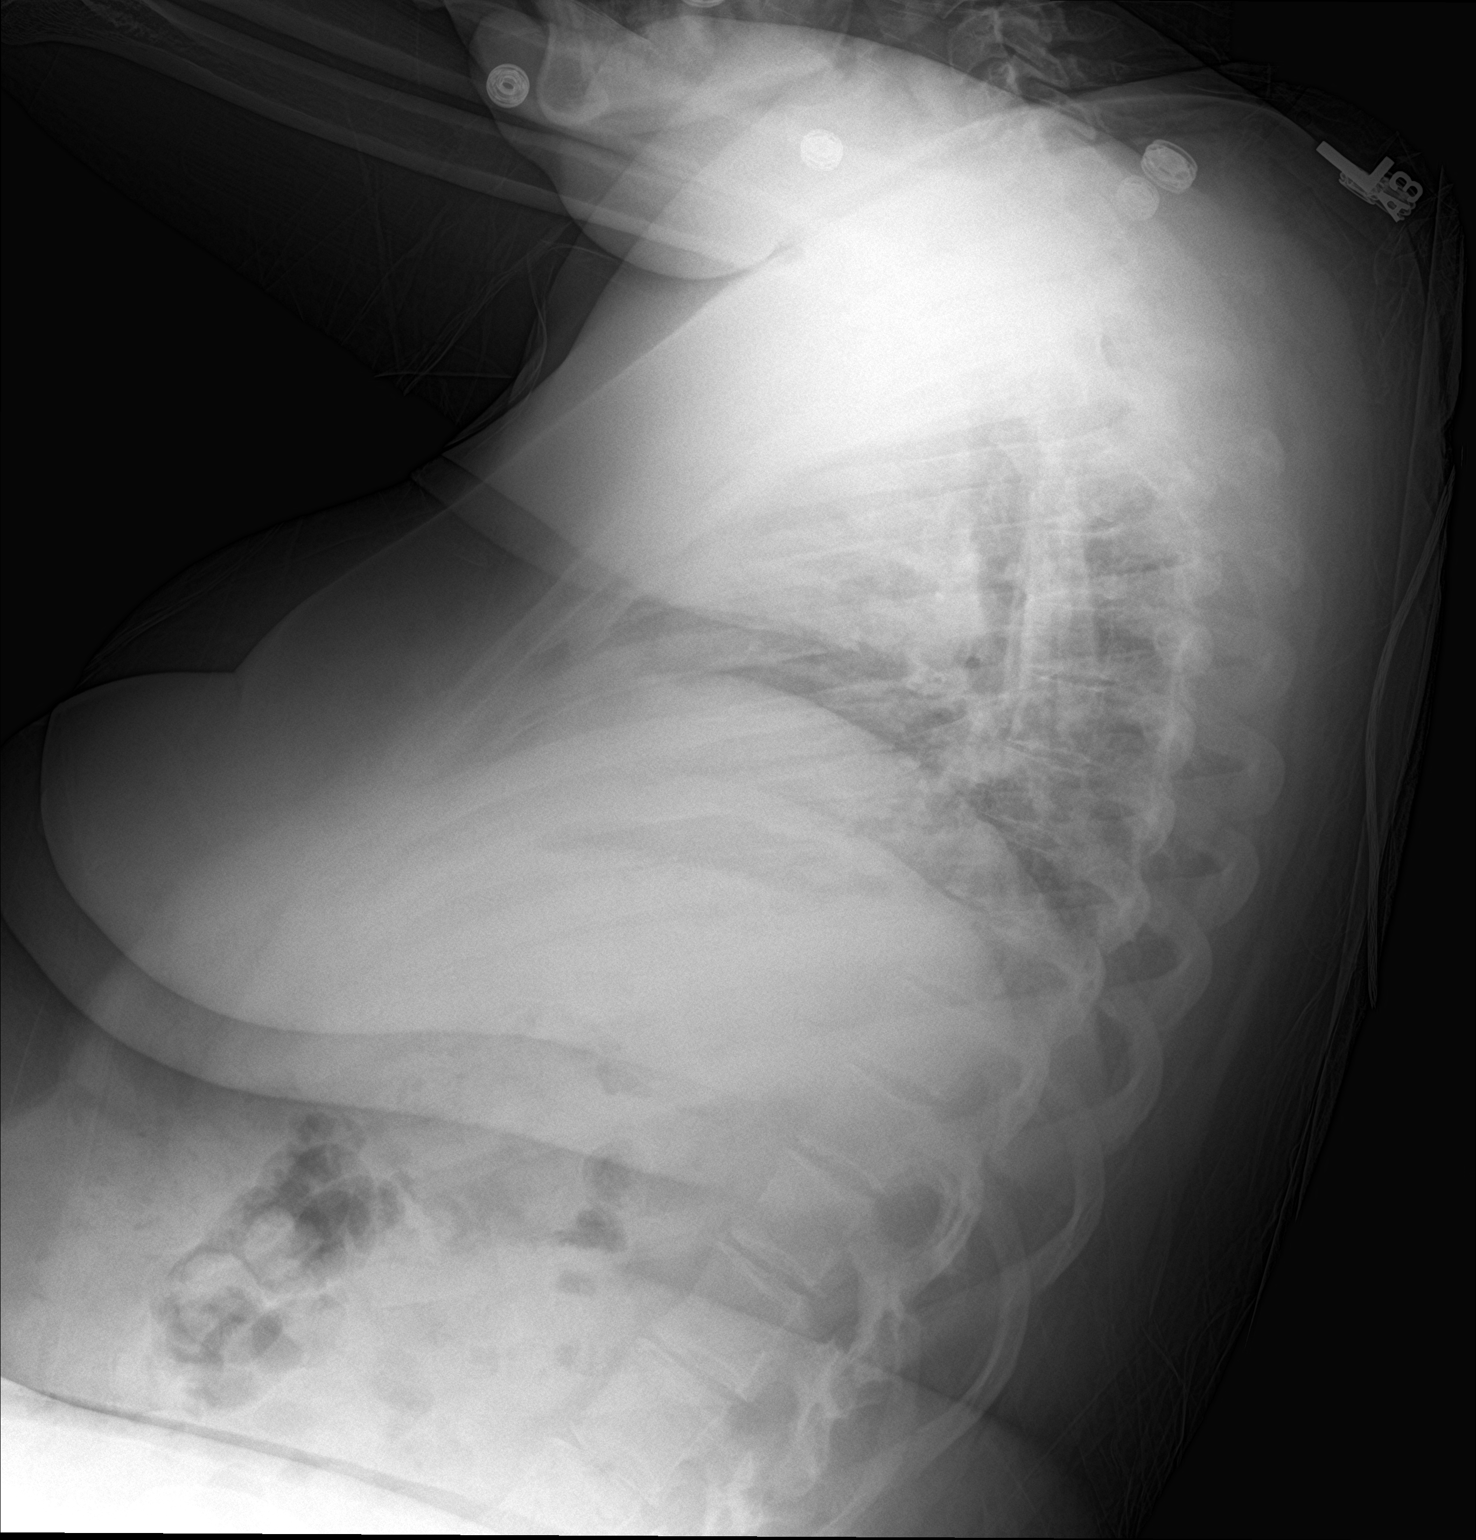

[2 of 2 positions shown; findings below may reference images not displayed]

FINDINGS: Low lung volumes. No consolidation or effusion. Cardiomediastinal
silhouette within normal limits. No pneumothorax.
IMPRESSION: No active cardiopulmonary disease.  Low lung volumes.

## 2018-09-15 ENCOUNTER — Ambulatory Visit: Payer: Medicare Other | Admitting: Physical Therapy

## 2018-09-15 ENCOUNTER — Other Ambulatory Visit: Payer: Self-pay

## 2018-09-15 DIAGNOSIS — R2681 Unsteadiness on feet: Secondary | ICD-10-CM | POA: Diagnosis not present

## 2018-09-15 DIAGNOSIS — R2689 Other abnormalities of gait and mobility: Secondary | ICD-10-CM

## 2018-09-15 DIAGNOSIS — M6281 Muscle weakness (generalized): Secondary | ICD-10-CM

## 2018-09-15 LAB — CULTURE, BLOOD (ROUTINE X 2)
Culture: NO GROWTH
Culture: NO GROWTH
Special Requests: ADEQUATE
Special Requests: ADEQUATE

## 2018-09-15 NOTE — Therapy (Addendum)
[facility] [address] [place], [place], [address] Phone: [phone]   Fax:  [phone]  Physical Therapy Treatment  Patient Details  Name: Lori Miles MRN: [id number] Date of Birth: 1995-06-29 Referring Provider (PT): Rockwell Germany, NP  CLINIC OPERATION CHANGES: Outpatient Neuro Rehab is open at lower capacity following universal masking, social distancing, and patient screening.  The patient's COVID risk of complications score is 3.  Encounter Date: 09/15/2018  PT End of Session - 09/15/18 2057    Visit Number  11    Number of Visits  11    Date for PT Re-Evaluation  09/15/18    Authorization Type  UHC Medicare    Authorization Time Period  04-14-18 - 07-11-18; 08-03-18 - 10-16-18    PT Start Time  0800    PT Stop Time  0850    PT Time Calculation (min)  50 min    Activity Tolerance  Patient tolerated treatment well   c/o not feeling well - mother states she thinks pt has UTI   Behavior During Therapy  [facility] for tasks assessed/performed       Past Medical History:  Diagnosis Date  . Asthma   . Autism    mom states no actual dx, but admits she has some symptoms  . Bacteriuria   . Chronic constipation   . Cognitive developmental delay   . Disruptive behavior disorder   . Family history of adverse reaction to anesthesia    mother had itching after anesthesia one time  . Frequency-urgency syndrome   . Gait disorder    ORGANIC PER NERUOLGIST NOTE (DR HICKLING)  . Generalized convulsive epilepsy ([place]) NEUROLOGIST-  DR HICKLING  . GERD (gastroesophageal reflux disease)   . Gout   . H/O sinus tachycardia   . History of acute respiratory failure 02/14/2015   SECONDARY TO CAP  . History of recurrent UTIs   . Interstitial cystitis   . Moderate intellectual disability   . OSA (obstructive sleep apnea)    no machine yet   . Partial epilepsy with impairment of consciousness ([place])    FOLLOWED BY DR  HICKLING  . Pneumonia 2018 last time  . PONV (postoperative nausea and vomiting)   . Seizure ([place])    most recent sz " at the end of summer"-last sz years ago per mother  . Urinary incontinence     Past Surgical History:  Procedure Laterality Date  . CYSTOSCOPY N/A 06/03/2017   Procedure: CYSTOSCOPY WITH EXAM UNDER ANESTHESIA;  Surgeon: Festus Aloe, MD;  Location[facility];  Service: Urology;  Laterality: N/A;  . CYSTOSCOPY WITH HYDRODISTENSION AND BIOPSY  04/14/2012   Procedure: CYSTOSCOPY/BIOPSY/HYDRODISTENSION;  Surgeon: Hanley Ben, MD;  Location: [facility];  Service: Urology;;  instillation of marcaine and pyridium  . EUA/  PAP SMEAR/  BREAST EXAM  03-12-2016   dr Ihor Dow [facility]  . EUA/ VAGINOSCOPY/ COLPOSCOPY FO THE HYMENAL RING  10-04-2009    dr Delsa Sale  [facility]  . TONSILLECTOMY      There were no vitals filed for this visit.  Subjective Assessment - 09/15/18 2055    Subjective  [name] states pt had episode of trembling and inability to stand/walk on Saturday (says she had a seizure)    Patient is accompained by:  Family member    Pertinent History  Cognitive developmentally delayed:  recurrent falls:  weakness:  tremor, unspecified:  generalized convulsive epilepsy;  autism spectrum disorder  Patient Stated Goals  improve walking and try to stop the tremors    Currently in Pain?  No/denies                       [facility] Adult PT Treatment/Exercise - 09/16/18 0001      Ambulation/Gait   Ambulation/Gait  Yes    Ambulation/Gait Assistance  4: Min guard    Ambulation Distance (Feet)  200 Feet    Gait Pattern  Step-through pattern   LLE internally rotates with foot flat contact at stance   Ambulation Surface  Level;Indoor    Stairs  Yes    Stairs Assistance  5: Supervision    Stair Management Technique  Two rails;Alternating pattern;Step to pattern   step to pattern with descension   Number of Stairs  4     Height of Stairs  6      Knee/Hip Exercises: Aerobic   Recumbent Bike  SciFit level 1.5 1" with rest period - then 5" additional with short intermittent rest periods       Knee/Hip Exercises: Machines for Strengthening   Cybex Leg Press  bil. LE's 40# 3 sets 10 reps      Knee/Hip Exercises: Standing   Hip Abduction  Stengthening;Left;1 set;10 reps;Knee straight   2# weight used      NeuroRed-ed;  Agility on floor - pt negotiated through ladder using step over step sequence with min hand held assist  Obstacle course - pt negotiated around cones (fig. 8's) with min hand held assist; performed coordination activity - stepping on  Stepping stones of various heights on floor - min to mod assist and with verbal cues to take big steps in order to step directly on top Of stepping stone  Pt performed zoom ball in standing for reactive balance reactions and to improve standing balance and LE strength- approx. 20 reps         PT Long Term Goals - 09/15/18 2058      PT LONG TERM GOAL #1   Title  Pt. will amb. 500' on flat, even surface without LOB.      Baseline  230' on 08-03-18 with hand held assist - pt able to amb. without assist    Time  4    Period  Weeks    Status  On-going      PT LONG TERM GOAL #2   Title  Pt will perform at least 10" on recumbent bike nonstop for improved endurance/activity tolerance.    Baseline  6 1/2" on 05-25-18;pt performed 5" on 08-03-18    Time  4    Period  Weeks    Status  On-going      PT LONG TERM GOAL #3   Title  Mother will verbalize understanding of HEP to assist with LE strengthening.      Baseline  met 05-25-18    Status  Achieved      PT LONG TERM GOAL #4   Title  Perform sit to stand 10 reps without UE support from mat to demo increased bil. LE strength.    Baseline  5 reps performed on 08-03-18    Time  4    Period  Weeks    Status  New              Patient will benefit from skilled therapeutic intervention in order to  improve the following deficits and impairments:     Visit Diagnosis: 1. Other  abnormalities of gait and mobility   2. Muscle weakness (generalized)   3. Unsteadiness on feet        Problem List Patient Active Problem List   Diagnosis Date Noted  . Glucosuria 06/01/2018  . Steroid-induced diabetes ([place]) 06/01/2018  . Not well controlled moderate persistent asthma 04/29/2018  . Gastroesophageal reflux disease 04/29/2018  . Recurrent infections 04/29/2018  . Dysuria 02/20/2018  . Chronic rhinitis 04/03/2017  . Recurrent falls 03/21/2017  . Weakness 03/21/2017  . Venous insufficiency 03/21/2017  . Excessive daytime sleepiness 03/05/2016  . 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 Cough variant asthma vs UACS/ vcd  03/15/2015  . Autism spectrum disorder 02/14/2015  . Nausea with vomiting 02/14/2015  . Obesity, morbid ([place]) 12/13/2014  . 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 Acanthosis nigricans 11/14/2014  . Toeing-in 08/29/2014  . Generalized convulsive epilepsy ([place]) 09/28/2012  . Partial epilepsy with impairment of consciousness ([place]) 09/28/2012  . 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 Recurrent urinary tract infection 08/01/2011  . Asthma 08/01/2011  . Chronic constipation 08/01/2011  . Contraception 08/01/2011    Alda Lea, [employer] [place] 09/16/2018, 8:06 PM  [facility] [address] [place], [place], [address] Phone: [phone]   Fax:  [phone]  Name: Lori Miles MRN: [id number] Date of Birth: May 06, 1995

## 2018-09-17 ENCOUNTER — Other Ambulatory Visit: Payer: Self-pay | Admitting: *Deleted

## 2018-09-17 NOTE — Patient Outreach (Addendum)
[facility]) Care Management  09/17/2018  Sharen HeckJasmine J Miles [birth year] [id number]   Telephone Screen  Referral Date:09/16/2018 [facility] - Westley ChandlerKaren Darden 09/15/18 Referral Reason: referring for care management and social work Member's mother is primary caregiver ans is very overwhelmed with care based on most recent phone call to mother. Mother is Dispensing opticianAcquanetta. member is Lori Miles who is nonverbal and autistic, Mother is POA, Mother states the member has had a UTI for months. Mother just finished two different antibiotics a few days ago  When member is in pain, she pulls on the area of her pain currently puling on her genital area. Mother also uses pictures to communicate with the member. Mother states when member gets a UTI she usually has pneumonia right after. Mother usually takes the member to [facility] [address] or a [facility] urgent care and they will treat member with the correct antibiotics. Mother states sometimes her husband gets off work too late to go to urgent care. Mother took member to ER on 6/23 and 6/24 and states both ER doctors did nothing to help. Those ER doctors also did not provide member with an antibiotic treatment because the UTI did not show on the cultures. Mother states she has told the ER doctors on multiple occasions that the members UTI does not show on a regular urine culture. Mother is changing members pcp because the pcp was not treating member properly and keep telling mother the member was not sick. The first available new patient appointment is in October which she scheduled. Mother could not remember the new pcps name but she said her [facility], Mother would like a sooner pcp appointment if possible with new pcp.  Mother states member had Medicaid as well and a community guide. She states the community guide have all tried to help her. Mother states she was told that [facility] doing [facility] visits. Member  is wetting herself and has run out of adult diapers. Mother states she has called the guy who helps her with adult diapers and is waiting on a call back from him.  Mother said she know about the [facility] catalog but cannot call them again until July. Mother makes sure member is staying hydrated. Members feet are swollen and she cannot put on her shoes. Member cannot take fluid pills dure to her heart and had to stop clonidine because it was making her blood pressure too low. Mother states member has a bedside commode, shower seat, two wheelchairs and home health aide the maximum hours with [employer] [facility] Um ER team provided numbers for [facility] caregiver support, customer service department to request. [facility] CM assist as well   Insurance: united healthcare medicare, medicaid of [place]   Outreach attempt # 1 successful  Patient's mother is able to verify HIPAA [name] Donald hs cognitive developmental delay, autistic, nonverbal Reviewed and addressed referral [facility] with [facility]   Her mother reports on Friday September 18 2018 the patient will be having a procedure to put her to sleep with the attempt to explore why patient is having recurrent UTIs Her mother informs [facility] RN CM she is wanting home visits.  [facility] RN CM explained to Lori Miles twice that [facility] staff are not providing home visits at this time related to COVID 19. She insisted x 1 that [facility] providing [facility] This was denied. [facility] RN CM inquired if she was confusing [facility] with home  health. [facility] RN CM discussed the differences in [facility] and Home health agencies. [facility] RN CM encouraged her to speak with a MD about home health visits if needed. The mother states she is changing primary care providers and has not had there first office visit and "think the seizure doctor may help"  She continues to share that she does "not care" for [facility] but "like [facility]" She states she knows a [facility] staff member  [facility] RN CM discussed a [facility] SW may  be able to offer her a list of local home health and personal care agencies The mother shares that she knows most of the local agencies but does "not care for most of them"   Last followed by Dr Pricilla Holm 08/13/18   She agreed to referrals to [facility] community RN CM and [facility] SW    Social: [name] lives with her family and her mother [name] is the primary caregiver.She is assist/dependent with all her care. A Gait belt is used for mobility instability  The family provides transportation to medical appointments   Conditions: venous insufficiency, asthma, chronic rhinitis, chronic constipation, N/V, GERD, generalized convulsive epilepsy, cognitive developmental delay, Acanthosis nigricans, recurrent UTI, sleep arousal disorder, obesity, moderate mental retardation, recurrent falls autism spectrum disorder, insomnia due to mental disorder   Medications: denies concerns with taking medications as prescribed, affording medications, side effects of medications and questions about medications  Appointments: 05/26/2018 child neurology T Goodpast[place], NP was seen 09/22/18  2000 [name] parrett 09/23/18 0700 [name] parrett 11/02/18 0800 neuro rehab PT weekly until 11/30/18   Advance Directives: Denies need for assist with advance directives Consent: [facility] RN CM reviewed [facility] services with patient. Patient gave verbal consent for services [facility] telephonic RN CM, [facility] SW and [facility] community RN CM.   Plan: [facility] RN CM will refer Ms Savo to [facility] community RN CM for the requested UM referral for care management related 14 ED visits in the last 6 months Hx of seizure,  recurrent UTIs and recurrent pneumonia  [facility] RN CM will refer Ms Brindle to [facility] SW for the requested UM referral for SW services This patient will need a further SW assessment The mentioned probably need of community resources, DME resources (adult diapers) and possible transportation resources to the telephonic RN CM   Pt encouraged  to return a call to [facility] RN CM prn  [facility] RN CM sent a successful outreach letter as discussed with [facility] brochure enclosed for review   Devansh Riese L. Lavina Hamman, RN, BSN, [place] Coordinator Office number [phone] Mobile number [phone]  Main [facility] number [phone] Fax number [phone]

## 2018-09-19 DIAGNOSIS — G4733 Obstructive sleep apnea (adult) (pediatric): Secondary | ICD-10-CM | POA: Diagnosis not present

## 2018-09-21 ENCOUNTER — Other Ambulatory Visit: Payer: Self-pay

## 2018-09-21 ENCOUNTER — Telehealth: Payer: Self-pay | Admitting: Internal Medicine

## 2018-09-21 NOTE — Telephone Encounter (Signed)
Attempted to call pt back but received message that voicemail box if full and cannot accept messages at this time.

## 2018-09-21 NOTE — Telephone Encounter (Signed)
Spoke with pts mother and she wanted to let Dr. Hilarie Fredrickson know that she is afraid to give pt the amitiza due to the listed side effects. States she is just increasing her miralax some. Dr. Hilarie Fredrickson aware.

## 2018-09-21 NOTE — Patient Outreach (Signed)
[facility]) Care Management  09/21/2018  Jamieson Lisa Wicke April 23, 1995 [id number]   Referral from [place] RN CM.  "Patient needs assistance for the requested UM referral for SW services This patient will need a further SW assessment The mentioned probably need of community resources, DME resources (adult diapers) and possible transportation resources "  Successful outreach to patient's mother/caregiver today.   Community resources:  Patient has multiple resources including Medicaid, CAP services, Glass blower/designer, and [employer]. Patient has incontinence supplies delivered each month.   Mother denied need for "community resources" as patient already has a myriad of services. She also denied the need for DME resources and transportation resources.  Mother reports that patient used to receive nursing services through [facility].  Per mother, these services were to be long-term because of all of patient's medical issues.  Mother would like for patient to receive these services again but reports that disciplines already involved (CAP, [employer], and Glass blower/designer) have not been able to assist.   BSW agreed to send her a list of in-home providers in the area but explained that a doctors order will be necessary for service to be provided and that home health is a temporary service. BSW will follow up within the next two weeks.  Ronn Melena, BSW Social Worker [phone]

## 2018-09-21 NOTE — Telephone Encounter (Signed)
Patient mother called in wanting to speak with the nurse about the medication that the doctor wanted her to try. She states with all the side effects she dont feel comfortable and will like a call to discuss.

## 2018-09-22 ENCOUNTER — Other Ambulatory Visit: Payer: Self-pay | Admitting: *Deleted

## 2018-09-22 ENCOUNTER — Ambulatory Visit (HOSPITAL_BASED_OUTPATIENT_CLINIC_OR_DEPARTMENT_OTHER): Payer: Medicare Other | Attending: Adult Health | Admitting: Pulmonary Disease

## 2018-09-22 DIAGNOSIS — Z79899 Other long term (current) drug therapy: Secondary | ICD-10-CM | POA: Insufficient documentation

## 2018-09-22 DIAGNOSIS — G4710 Hypersomnia, unspecified: Secondary | ICD-10-CM | POA: Insufficient documentation

## 2018-09-22 DIAGNOSIS — R0683 Snoring: Secondary | ICD-10-CM

## 2018-09-22 DIAGNOSIS — G4719 Other hypersomnia: Secondary | ICD-10-CM

## 2018-09-22 NOTE — Patient Outreach (Addendum)
[facility]) Care Management  09/22/2018  Lori Miles February 06, 1996 [id number]   Care coordination  [facility] RN CM received a call from [facility] SW and a message from patient's mother on 09/21/18 [facility] RN CM was able to speak with [facility] SW on 09/21/18 about the conclusion of the call to initiate the [facility] SW referral for the patient  On 09/22/18 [facility] RN CM returned the call after the mother had left a voice message on 09/21/18  Patient's mother is able to verify HIPAA  Consent: [facility] RN CM reviewed [facility] services with patient's mother. Patient's mother gave verbal consent for telephonic RN CM services.  Lori Miles discussed her contact with [facility] SW She is to be sent a list of resources for home health services She states [facility] SW will not be able to further help her with what she is wanting  She informs [facility] RN CM that she is wanting long term ("maybe 6 to seven weeks", "to check her blood pressure, temperature") of a home visiting nurse services for Lori Miles. She and [facility] RN CM discussed today as on 09/17/18 that Cm recognizes this as home health services. She states today that the last long term home visiting nurse [facility]. She states she is aware that [facility] has changed its name and "I don't want to use them". CM discussed that [facility] is a home health services. [facility] RN CM again discussed that [facility] is not a home health services, [facility] staff does not provide long term care services and that [facility] staff are not completing home visits related to [place]. Then mother informs CM that she has been informed that [facility] "will start home visits again soon" [facility] RN CM informs her that this is still to be determined. [facility] RN CM encourages the mother to receive the list of home health agency from [facility] SW and to discuss it with the new pending [facility] provider to be seen on December 23 2018.  Lori Miles was previously being seen by Dr Pricilla Holm but the mother  reports her preference is to have the patient seen by a pending [facility] provider  [facility] RN CM discussed personal care services The mother reports Lori Miles receives personal care services from [place]. She denies what she is wanting is personal care services or hospice services. She states Willadean Carol is Lori Miles's care guide/care coordinator via [facility]  Lori Miles is unable to identify the name/type of the long term visiting nursing services she is wanting the patient to receive. She mentions [facility] and [facility]. [facility] RN CM also discusses each as listed home health agencies with limitations on home visits  [facility] RN CM and Lori Miles's mother conclude that [facility] staff has offered available resources but is not aware of a long term home visiting nurse resource to offer. Lori Miles, the patient's mother agrees. She states it would be better to address what she is requesting with the new primary care MD. She confirms a [facility] community RN CM (that is not making home visit related to [place]) would not be beneficial   [facility] RN CM spoke with [facility] SW, Amber to review the call with the mother. [facility] RN CM and [facility] SW conclude that [facility] staff has offered available resources but is not aware of a long term home visiting nurse resource to offer. This patient per mother already has lots of community resources and [facility] servciecs  [facility] RN CM spoke with  the [facility] community RN CM, Ann to update her on the services already provided to the mother and the need to cancel the [facility] community RN CM services as no home visits can be offered as the mother is wanting at this time   [facility] CM returned a call to Lori Miles to provide a warm case closure in which she agrees with She at this time was assessed about her possible need of transportation information She informs [facility] RN CM she is aware of the available resources but prefers to only use transportation offered by her husband or possible the [facility] staff on minimal occasions.  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 Lori Miles, the patient's mother agrees. She states it would be better to address what she is requesting with the new primary care MD. She confirms a [facility] community RN CM (that is not making home visit related to COVID) would not be beneficial at this time  [place] VillageKimberly L. Noelle PennerGibbs, RN, BSN, CCM [facility] Telephonic Care Management Care Coordinator Office number [phone] Mobile number [phone]  Main [facility] number [phone] Fax number [phone]

## 2018-09-23 ENCOUNTER — Other Ambulatory Visit: Payer: Self-pay

## 2018-09-23 ENCOUNTER — Ambulatory Visit (HOSPITAL_BASED_OUTPATIENT_CLINIC_OR_DEPARTMENT_OTHER): Payer: Medicare Other | Attending: Adult Health | Admitting: Pulmonary Disease

## 2018-09-23 VITALS — Ht <= 58 in | Wt 185.0 lb

## 2018-09-23 DIAGNOSIS — G4719 Other hypersomnia: Secondary | ICD-10-CM | POA: Diagnosis present

## 2018-09-23 DIAGNOSIS — G47419 Narcolepsy without cataplexy: Secondary | ICD-10-CM

## 2018-09-24 ENCOUNTER — Telehealth: Payer: Self-pay | Admitting: Adult Health

## 2018-09-24 NOTE — Telephone Encounter (Signed)
Called and spoke to pt's mother, [name]. Inquired the reason for her call was and she refused to speak with me. She states she would only speak with Janett Billow and did not want to speak to anyone else. I advised her that Janett Billow is unavailable and not sure when she would be available to call back and I am capable of handling her questions/concerns/needs. She refused and states she would only speak with Janett Billow.

## 2018-09-24 NOTE — Telephone Encounter (Signed)
Called spoke with patient's mother Aquantetta.  Per [name] she requested to only speak with me because she did not want to explain "everything to someone else".  [facility] NP ordered MSLT at last visit.  Insurance required accompanying NPSG.  [name] is concerned because the NPSG did not involve patient wearing a CPAP for the 2nd half of the test.  Explained to [name] that this test that was required by insurance does not utilize CPAP therapy for half of the testing.  [name] would like split night study repeated because "even the people at the sleep lab could tell that she needs CPAP."  Explained to [name] that it would be best to await the NPSG and MSLT results before asking for further testing.  [name] is aware that results are not yet available.  Will sign off and route to Parrett NP as Juluis Rainier

## 2018-09-25 DIAGNOSIS — G4719 Other hypersomnia: Secondary | ICD-10-CM

## 2018-09-25 DIAGNOSIS — R0683 Snoring: Secondary | ICD-10-CM | POA: Diagnosis not present

## 2018-09-25 DIAGNOSIS — R400 Somnolence: Secondary | ICD-10-CM

## 2018-09-25 NOTE — Telephone Encounter (Signed)
PSG 09/22/18 >> total sleep time 367.5 min.  AHI 0.7, SpO2 low 89%, PLMI 0.  Took trazodone and melatonin. MSLT 09/23/18 >> sleep onset in 5 of 5 naps with mean sleep latency 29 seconds.  1 of 5 naps with SOREM.    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  No evidence for sleep apnea.  She needs to have next available ROV with me.

## 2018-09-25 NOTE — Telephone Encounter (Signed)
Called spoke with patient's mother Lori Miles, who expressed some frustration with Lori Miles's test results.  Lori Miles is adamant that a previous sleep study done by neurology showed OSA and feels CPAP would really be beneficial for patient.  Lori Miles stated that she really feels patient needs another split night study - if this one is negative then she stated "she will finally agree to leave it alone."  Explained to patient about appt with Dr Halford Chessman to discuss in detail and that VS has opening today at 12N.  Patient unable to come for appt today due to previously scheduled procedure in [place].    Lori Miles did express additional concern about bringing patient into the office "and talking about her sleep in front of her because it sometimes makes her upset."  Televisit scheduled with [facility] NP for 7.14.2020 @ 1000 to discuss results in detail and discuss possibility of patient having another split night study which Lori Miles has reported that insurance already stated they will cover.  Will sign off.

## 2018-09-25 NOTE — Telephone Encounter (Signed)
Called Aquantetta, read her the recommendations per Dr. Halford Chessman, she states she is going to wait for Janett Billow to call her back because she knows her daughter stops breathing when sleeping and wants the study where she has to wear the mask.   Routing to [place] for further investigation and Dr. Halford Chessman as Juluis Rainier

## 2018-09-25 NOTE — Procedures (Signed)
    Patient Name: Lori Miles, Lori Miles Date: 09/22/2018 Gender: Female D.O.B: 1996/01/16 Age (years): 22 Referring Provider: [name] Parrett Height (inches): 60 Interpreting Physician: Chesley Mires MD, ABSM Weight (lbs): 189 RPSGT: Zadie Rhine BMI: 38 MRN: [id number] Neck Size: 15.50  CLINICAL INFORMATION Sleep Study Type: NPSG  Indication for sleep study: 23 year old female with persistent daytime sleepiness.  Prior sleep studies were negative for sleep apnea.  She presents for repeat nocturnal polysomnogram to be followed by multiple sleep latency test.  Epworth Sleepiness Score: 21  SLEEP STUDY TECHNIQUE As per the AASM Manual for the Scoring of Sleep and Associated Events v2.3 (April 2016) with a hypopnea requiring 4% desaturations.  The channels recorded and monitored were frontal, central and occipital EEG, electrooculogram (EOG), submentalis EMG (chin), nasal and oral airflow, thoracic and abdominal wall motion, anterior tibialis EMG, snore microphone, electrocardiogram, and pulse oximetry.  MEDICATIONS Medications self-administered by patient taken the night of the study : CLONIDINE, MELATONIN, TRAZODONE  SLEEP ARCHITECTURE The study was initiated at 10:37:25 PM and ended at 5:56:16 AM.  Sleep onset time was 0.1 minutes and the sleep efficiency was 83.7%%. The total sleep time was 367.5 minutes.  Stage REM latency was 43.0 minutes.  The patient spent 0.1%% of the night in stage N1 sleep, 45.2%% in stage N2 sleep, 18.6%% in stage N3 and 36.1% in REM.  Alpha intrusion was absent.  Supine sleep was 0.23%.  RESPIRATORY PARAMETERS The overall apnea/hypopnea index (AHI) was 0.7 per hour. There were 1 total apneas, including 0 obstructive, 1 central and 0 mixed apneas. There were 3 hypopneas and 0 RERAs.  The AHI during Stage REM sleep was 1.8 per hour.  AHI while supine was 0.0 per hour.  The mean oxygen saturation was 95.7%. The minimum SpO2 during sleep was  89.0%.  moderate snoring was noted during this study.  CARDIAC DATA The 2 lead EKG demonstrated sinus rhythm. The mean heart rate was 93.7 beats per minute. Other EKG findings include: None.  LEG MOVEMENT DATA The total PLMS were 0 with a resulting PLMS index of 0.0. Associated arousal with leg movement index was 0.0 .  IMPRESSIONS - She had more than 6 hours of sleep during this study. - She did not have significant sleep apnea.  Her AHI was 0.7 with an SpO2 low of 89%. - No significant periodic limb movements. - The patient snored with moderate snoring volume.  DIAGNOSIS - Snoring - Hypersomnia  RECOMMENDATIONS - Please correlate findings with multiple sleep latency test performed on 09/23/2018.  [Electronically signed] 09/25/2018 08:24 AM  Chesley Mires MD, ABSM Diplomate, American Board of Sleep Medicine   NPI: [id number]

## 2018-09-25 NOTE — Procedures (Signed)
    Patient Name: Lori Miles, Lori Miles Date: 09/23/2018 Gender: Female D.O.B: [birth year] Age (years): 22 Referring Provider: [name] Parrett Height (inches): 60 Interpreting Physician: Chesley Mires MD, ABSM Weight (lbs): 189 RPSGT: Jacolyn Reedy BMI: 38 MRN: [id number] Neck Size: 15.50  CLINICAL INFORMATION Sleep Study Type: MSLT  The patient was referred to the sleep center for evaluation of daytime sleepiness.  She had nocturnal polysomogram from 09/22/2018 which showed 367.5 minutes of total sleep time without other explaination for her daytime sleepiness.  Of note is that she reported to use melatonin and trazodone at the beginning of the nocturnal polysomnogram.  Epworth Sleepiness Score: 21  SLEEP STUDY TECHNIQUE A Multiple Sleep Latency Test was performed after an overnight polysomnogram according to the AASM scoring manual v2.3 (April 2016) and clinical guidelines. Five nap opportunities occurred over the course of the test which followed an overnight polysomnogram. The channels recorded and monitored were frontal, central, and occipital electroencephalography (EEG), right and left electrooculogram (EOG), chin electromyography (EMG), and electrocardiogram (EKG).  MEDICATIONS Medications taken by the patient : CLONIDINE, MELATONIN, TRAZODONE Medications administered by patient during sleep study : No sleep medicine administered.  IMPRESSIONS - There were 5 nap sessions.  She achieved sleep onset in all 5 nap sessions with a mean sleep latency of 29 seconds. - She sleep onset REM period in 1 of 5 nap sessions.  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  RECOMMENDATIONS - Return to sleep medicine clinic to further assess for narcolepsy.  [Electronically signed] 09/25/2018 08:38 AM  Chesley Mires MD, ABSM Diplomate, American Board of Sleep Medicine   NPI: [id number]

## 2018-09-26 DIAGNOSIS — G809 Cerebral palsy, unspecified: Secondary | ICD-10-CM | POA: Diagnosis not present

## 2018-09-26 DIAGNOSIS — J4520 Mild intermittent asthma, uncomplicated: Secondary | ICD-10-CM | POA: Diagnosis not present

## 2018-09-28 ENCOUNTER — Telehealth: Payer: Self-pay | Admitting: Adult Health

## 2018-09-29 ENCOUNTER — Telehealth: Payer: Self-pay | Admitting: Pulmonary Disease

## 2018-09-29 ENCOUNTER — Ambulatory Visit (INDEPENDENT_AMBULATORY_CARE_PROVIDER_SITE_OTHER): Payer: Medicare Other | Admitting: Adult Health

## 2018-09-29 ENCOUNTER — Other Ambulatory Visit: Payer: Self-pay

## 2018-09-29 ENCOUNTER — Encounter: Payer: Self-pay | Admitting: Adult Health

## 2018-09-29 DIAGNOSIS — G4719 Other hypersomnia: Secondary | ICD-10-CM

## 2018-09-29 DIAGNOSIS — R0683 Snoring: Secondary | ICD-10-CM | POA: Diagnosis not present

## 2018-09-29 DIAGNOSIS — G4700 Insomnia, unspecified: Secondary | ICD-10-CM

## 2018-09-29 DIAGNOSIS — G47429 Narcolepsy in conditions classified elsewhere without cataplexy: Secondary | ICD-10-CM | POA: Diagnosis not present

## 2018-09-29 NOTE — Telephone Encounter (Signed)
Pt has a televisit with TP today at 10am. Will address issues/concerns at that time.

## 2018-09-29 NOTE — Telephone Encounter (Signed)
Returned call from patient's mother, Lori Miles. She wanted to let Dr. Halford Miles and Lori Miles know that she continues to be concerned about her daughters breathing at night.  She reported that she has witnessed her daughter stop breathing at night and knows that she has sleep apnea even though testing has shown otherwise.  Patient's mother stated she wants another sleep study done and will be getting a second opinion.  She feels like no one is listening to her about concerns for the safety of her daughter.  Took the time to listen to her concern and to reiterate the message that she had already been given.  Nothing needed.  Just sending to VS and TP as FYI.  Thanks!

## 2018-09-29 NOTE — Telephone Encounter (Signed)
Per TP, pt mother's concerns were addressed that the visit today  Will sign off

## 2018-09-29 NOTE — Patient Instructions (Signed)
Follow up with our office As needed   Call back if you would like to discuss referral for second opinion.  Please contact office for sooner follow up if symptoms do not improve or worsen or seek emergency care

## 2018-09-29 NOTE — Progress Notes (Signed)
Virtual Visit via Telephone Note  I connected with Lori Miles on 09/29/18 at 10:00 AM EDT by telephone and verified that I am speaking with the correct person using two identifiers.  Location: Patient: Home (Mother Lori Miles present for visit as patient is unable to communicate  Provider: Office    I discussed the limitations, risks, security and privacy concerns of performing an evaluation and management service by telephone and the availability of in person appointments. I also discussed with the patient that there may be a patient responsible charge related to this service. The patient expressed understanding and agreed to proceed.   History of Present Illness: Today's tele-visit is a follow-up for hypersomnia and recent sleep study/M SLT  Patient is a 23 year old female never smoker that has a complicated medical history that includes cerebral palsy, mental insufficiency with severe behavioral issues and autism.  She has an underlying seizure disorder.  She also has asthma and allergic rhinitis followed by Dr. Neldon Miles with asthma and allergy. Patient has ongoing sleep issues and daytime hypersomnia.  She has had multiple sleep studies to evaluate this.  Sleep studies have been negative for sleep apnea in the past, and only notable for primary snoring. A CPAP titration study was done in June 2018 that showed a negative diagnostic sleep study but showed elimination of her snoring with 98% sleep efficiency on CPAP 10 cm H2O.  Patient is mother complains of ongoing snoring significant sleep disturbances insomnia and daytime sleepiness.  She frequently falls asleep during the daytime.  Patient uses trazodone and melatonin to help with sleep.  Which sometimes does not help. Patient has requested a CPAP machine on multiple occasions.  Has been declined due to her repeated negative sleep studies for sleep apnea.  A letter was written to her insurance company Medicaid requesting a CPAP machine as  patient did well on this with elimination of snoring and improved sleep efficiency however this was declined. Patient was set up for a repeat sleep study that was done on September 22, 2018 that showed total sleep time at 367 minutes.  There was no significant sleep apnea with a AHI at 0.7.  PLM I was 0.  She had a M SLT done on September 23, 2018 with sleep onset and 5 of 5 naps with mean sleep latency at 29 seconds.  This was felt to be consistent with narcolepsy. I tried to go over these results with patient's mother unfortunately was unable to relay this information effectively as patient's mother is demanding that a repeat sleep study be done as she is convinced that her daughter has sleep apnea and needs a CPAP machine.  After many trials to explain narcolepsy and previous sleep studies I was unable to relay information effectively.  I offered patient a referral to [facility] for a second opinion.  Patient's mother declined and says that she is already gotten an appointment for a sleep evaluation with another sleep provider.  I discussed possible treatments for narcolepsy however patient's mother did not want to discuss this or consider treatment options.    Observations/Objective: PSG 09/22/18 >> total sleep time 367.5 min.  AHI 0.7, SpO2 low 89%, PLMI 0.  Took trazodone and melatonin. MSLT 09/23/18 >> sleep onset in 5 of 5 naps with mean sleep latency 29 seconds.  1 of 5 naps with SOREM.    -Sleep study January 06, 2015 at [place] sleep study at [facility] AHI 0.4 (total sleep time 428 minutes)moderate to  loud snoring -Sleep study March 27, 2016 AHI 0.1 SaO2 low 90%, positive snoring -Sleep study December 10, 2017 AHI 0.3, SaO2 low 93% (sleep time 413 minutes) positive snoring  -A CPAP titration study was ordered June 2018 with known negative diagnostic sleep study with elimination of snoring on CPAP 10 cm H2O total sleep time 367 minutes with sleep efficiency  98%  Assessment and Plan: Narcolepsy -multiple sleep studies-total of 5 sleep studies showing no significant sleep apnea. M SLT study on September 23, 2018 consistent with narcolepsy. Unable to educate patient's mother on underlying disorder and possible treatment options.  Have offered a referral for second opinion at outlying medical centers such as [facility] or [facility].  She has declined.   Follow Up Instructions: Follow up with our office As needed   Call back if you would like to discuss referral for second opinion.  Please contact office for sooner follow up if symptoms do not improve or worsen or seek emergency care      I discussed the assessment and treatment plan with the patient. The patient was provided an opportunity to ask questions and all were answered. The patient agreed with the plan and demonstrated an understanding of the instructions.   The patient was advised to call back or seek an in-person evaluation if the symptoms worsen or if the condition fails to improve as anticipated.  I provided 18  minutes of non-face-to-face time during this encounter.   Lori Miles , NP

## 2018-10-02 ENCOUNTER — Ambulatory Visit: Payer: Self-pay

## 2018-10-05 ENCOUNTER — Other Ambulatory Visit: Payer: Self-pay

## 2018-10-05 NOTE — Patient Outreach (Signed)
[facility]) Care Management  10/05/2018  Lori Miles 1996-01-18 [id number]   Follow up call to patient's mom today.  She confirmed receipt of Home Care Directory that she requested be mailed to her.  Mother stated that this list was not helpful because she was already familiar with all of the agencies listed.  She also stated "this is not what I'm looking for"  Mother states that she is looking for a nurse to come into the home to check patient's vitals, etc.  Mother was adamant that patient has received this service before and that it was not Home Health.  BSW encouraged her to talk with medical provider about what service she is seeking.  BSW closing case at this time.  Ronn Melena, BSW Social Worker [phone]

## 2018-10-07 ENCOUNTER — Telehealth: Payer: Self-pay | Admitting: Pulmonary Disease

## 2018-10-07 NOTE — Telephone Encounter (Signed)
Spoke with pt's mom who was requesting to have sleep study mailed to address. Study has been printed and placed in mail for pt. Nothing further needed.

## 2018-10-08 ENCOUNTER — Telehealth: Payer: Self-pay | Admitting: Pulmonary Disease

## 2018-10-08 DIAGNOSIS — K5900 Constipation, unspecified: Secondary | ICD-10-CM | POA: Diagnosis not present

## 2018-10-08 DIAGNOSIS — N390 Urinary tract infection, site not specified: Secondary | ICD-10-CM | POA: Diagnosis not present

## 2018-10-08 NOTE — Telephone Encounter (Signed)
Call returned to patient mother, she states she ONLY wants to speak with Janett Billow and she will hold for however long it takes. I explained that Janett Billow is working with a provider and we cannot estimate what time she may be able to call her back. She reports she will wait as long as it takes. She reports her daughter is going to have surgery and she needs to discuss some things with Janett Billow.   Will route message to [place].

## 2018-10-12 DIAGNOSIS — J449 Chronic obstructive pulmonary disease, unspecified: Secondary | ICD-10-CM | POA: Diagnosis not present

## 2018-10-12 NOTE — Telephone Encounter (Signed)
Called [name], pt's mother. I inquired diligently to get any information from the mother but she refused repeatedly and stated, 'I just like talking to [place]'. She states it does not have to do with the pt's sleep study and also stated she is 'ok waiting a month for Jess to call'.

## 2018-10-13 ENCOUNTER — Other Ambulatory Visit: Payer: Self-pay | Admitting: Allergy and Immunology

## 2018-10-13 NOTE — Telephone Encounter (Signed)
Jess, please advise on this as pt's mother only wants to speak with you.

## 2018-10-14 NOTE — Telephone Encounter (Signed)
Lori Miles is aware that pt's mother only wants to speak with her.

## 2018-10-15 DIAGNOSIS — Z1159 Encounter for screening for other viral diseases: Secondary | ICD-10-CM | POA: Diagnosis not present

## 2018-10-16 NOTE — Telephone Encounter (Signed)
Called spoke with patient's mother Lori Miles who reported that she would TP and VS to be aware that patient's CPAP titration study from 08/2016 read by [facility] "does say that Lori Miles has a little bit of sleep apnea.  He said she doesn't have much but he recommended she have a trial of CPAP."  Advised pt's mother Lori Miles will route message to TP and VS - though this has already been discussed numerous times and at multiple office visits.  [name] reported they are going for a 2nd opinion and she "don't want to deal with [name] anymore."  Will sign and route to TP and VS as FYI.

## 2018-10-19 DIAGNOSIS — R569 Unspecified convulsions: Secondary | ICD-10-CM | POA: Diagnosis not present

## 2018-10-19 DIAGNOSIS — K5900 Constipation, unspecified: Secondary | ICD-10-CM | POA: Diagnosis not present

## 2018-10-19 DIAGNOSIS — N390 Urinary tract infection, site not specified: Secondary | ICD-10-CM | POA: Diagnosis not present

## 2018-10-23 DIAGNOSIS — Z79899 Other long term (current) drug therapy: Secondary | ICD-10-CM | POA: Diagnosis not present

## 2018-10-23 DIAGNOSIS — K219 Gastro-esophageal reflux disease without esophagitis: Secondary | ICD-10-CM | POA: Diagnosis not present

## 2018-10-23 DIAGNOSIS — N3281 Overactive bladder: Secondary | ICD-10-CM | POA: Diagnosis not present

## 2018-10-23 DIAGNOSIS — N390 Urinary tract infection, site not specified: Secondary | ICD-10-CM | POA: Diagnosis not present

## 2018-10-23 DIAGNOSIS — R3915 Urgency of urination: Secondary | ICD-10-CM | POA: Diagnosis not present

## 2018-10-23 DIAGNOSIS — Z87440 Personal history of urinary (tract) infections: Secondary | ICD-10-CM | POA: Diagnosis not present

## 2018-10-23 DIAGNOSIS — R350 Frequency of micturition: Secondary | ICD-10-CM | POA: Diagnosis not present

## 2018-10-27 NOTE — Progress Notes (Signed)
Reviewed and agree with assessment/plan.   Dalayah Deahl, MD [place] 03/13/2016, 12:24 PM Pager:  [phone]  

## 2018-10-30 NOTE — Progress Notes (Signed)
Reviewed and agree with assessment/plan.   Denijah Karrer, MD [place] [facility] 03/13/2016, 12:24 PM Pager:  [phone]  

## 2018-11-02 ENCOUNTER — Other Ambulatory Visit: Payer: Self-pay

## 2018-11-02 ENCOUNTER — Ambulatory Visit: Payer: Medicare Other | Attending: Family | Admitting: Physical Therapy

## 2018-11-02 DIAGNOSIS — R2689 Other abnormalities of gait and mobility: Secondary | ICD-10-CM

## 2018-11-02 DIAGNOSIS — R2681 Unsteadiness on feet: Secondary | ICD-10-CM

## 2018-11-02 DIAGNOSIS — M6281 Muscle weakness (generalized): Secondary | ICD-10-CM | POA: Diagnosis not present

## 2018-11-03 NOTE — Therapy (Signed)
[facility] [address] [place], [place], [address] Phone: [phone]   Fax:  [phone]  Physical Therapy Treatment  Patient Details  Name: Lori Miles MRN: [id number] Date of Birth: 05/25/1995 Referring Provider (PT): Rockwell Germany, NP   Encounter Date: 11/02/2018  PT End of Session - 11/03/18 2049    Visit Number  12    Number of Visits  15    Date for PT Re-Evaluation  12/03/18    Authorization Type  [employer] Medicare    Authorization Time Period  04-14-18 - 07-11-18; 08-03-18 - 10-16-18:  11-02-18 - 01-02-19    PT Start Time  0801    PT Stop Time  0845    PT Time Calculation (min)  44 min    Equipment Utilized During Treatment  --   heel lift   Activity Tolerance  Patient tolerated treatment well    Behavior During Therapy  [facility] for tasks assessed/performed       Past Medical History:  Diagnosis Date  . Asthma   . Autism    mom states no actual dx, but admits she has some symptoms  . Bacteriuria   . Chronic constipation   . Cognitive developmental delay   . Disruptive behavior disorder   . Family history of adverse reaction to anesthesia    mother had itching after anesthesia one time  . Frequency-urgency syndrome   . Gait disorder    ORGANIC PER NERUOLGIST NOTE (DR HICKLING)  . Generalized convulsive epilepsy ([place]) NEUROLOGIST-  DR HICKLING  . GERD (gastroesophageal reflux disease)   . Gout   . H/O sinus tachycardia   . History of acute respiratory failure 02/14/2015   SECONDARY TO CAP  . History of recurrent UTIs   . Interstitial cystitis   . Moderate intellectual disability   . OSA (obstructive sleep apnea)    no machine yet   . Partial epilepsy with impairment of consciousness ([place])    FOLLOWED BY DR HICKLING  . Pneumonia 2018 last time  . PONV (postoperative nausea and vomiting)   . Seizure ([place])    most recent sz " at the end of summer"-last sz years ago per mother  . Urinary  incontinence     Past Surgical History:  Procedure Laterality Date  . CYSTOSCOPY N/A 06/03/2017   Procedure: CYSTOSCOPY WITH EXAM UNDER ANESTHESIA;  Surgeon: Festus Aloe, MD;  Location: [facility];  Service: Urology;  Laterality: N/A;  . CYSTOSCOPY WITH HYDRODISTENSION AND BIOPSY  04/14/2012   Procedure: CYSTOSCOPY/BIOPSY/HYDRODISTENSION;  Surgeon: Hanley Ben, MD;  Location: [facility];  Service: Urology;;  instillation of marcaine and pyridium  . EUA/  PAP SMEAR/  BREAST EXAM  03-12-2016   dr Ihor Dow [facility]  . EUA/ VAGINOSCOPY/ COLPOSCOPY FO THE HYMENAL RING  10-04-2009    dr Delsa Sale  [facility]  . TONSILLECTOMY      There were no vitals filed for this visit.  Subjective Assessment - 11/03/18 2036    Subjective  [name] states pt had episode of trembling last weekend - states these episodes are always worse when she has a UTI; mother states her hand are trembling today -says there have been times at home when they have had to help her hold food & drink due to spillage due to hands trembling so much    Patient is accompained by:  Family member    Pertinent History  Cognitive developmentally delayed:  recurrent falls:  weakness:  tremor, unspecified:  generalized convulsive epilepsy;  autism spectrum disorder    Patient Stated Goals  improve walking and try to stop the tremors    Currently in Pain?  No/denies                       [facility] Adult PT Treatment/Exercise - 11/03/18 0001      Ambulation/Gait   Ambulation/Gait  Yes    Ambulation/Gait Assistance  4: Min guard    Ambulation Distance (Feet)  230 Feet    Gait Pattern  Step-through pattern   LLE internally rotates with foot flat contact at stance   Ambulation Surface  Level;Indoor    Gait Comments  gait pattern analyzed due to pt continuing to internally rotate LLE; consulted with another PT, Jamey Reas, DPT, who suggested trial of heel wedge in Lt shoe due to  apparent LLD; approx. 1/2" heel wedge trialed which resulted in incr. Lt ankle instability;   used shoe lift of various heights to even out pt's Lt pelvis - determined that 3/8" shoe lift would be correct height for Lt shoe       Knee/Hip Exercises: Aerobic   Recumbent Bike  SciFit level 1.5 x 3", 1" then pt discontinued this activity       Attempted standing activity to facilitate external rotation on LLE - pt stood in // bars with UE support - tried to facilitate LLE external rotation By having pt touch target on Lt lateral side with Lt foot but pt's RLE began trembling with this activity so it was discontinued due to  RLE instability - pt able to amb. From // bars to mat table without tremors noted in RLE       PT Education - 11/03/18 2047    Education Details  recommend 3/8" shoe lift for LLE    Person(s) Educated  Patient;Parent(s)    Methods  Explanation    Comprehension  Verbalized understanding          PT Long Term Goals - 11/03/18 2055      PT LONG TERM GOAL #1   Title  Pt. will amb. 500' on flat, even surface without LOB.      Baseline  230' on 08-03-18 with hand held assist - pt able to amb. without assist    Time  4    Period  Weeks    Status  On-going    Target Date  12/03/18      PT LONG TERM GOAL #2   Title  Pt will perform at least 10" on recumbent bike nonstop for improved endurance/activity tolerance.    Baseline  6 1/2" on 05-25-18;pt performed 5" on 08-03-18    Time  4    Period  Weeks    Status  On-going    Target Date  12/03/18      PT LONG TERM GOAL #3   Title  Mother will verbalize understanding of HEP to assist with LE strengthening.      Baseline  met 05-25-18    Status  Achieved      PT LONG TERM GOAL #4   Title  Perform sit to stand 10 reps without UE support from mat to demo increased bil. LE strength.    Baseline  5 reps performed on 08-03-18    Time  4    Period  Weeks    Status  New    Target Date  12/03/18  PT LONG TERM GOAL #5    Title  Orthotic consult and shoe lift for LLE will be obtained for improved gait pattern.    Time  4    Period  Weeks    Status  New    Target Date  12/03/18            Plan - 11/03/18 2057    Clinical Impression Statement  Pt returns to PT after being placed on hold after visit on 09-15-18 due to primary PT's hours of 11:00 - 7:00 with pt being unable to come during these times due to lack of transportation.  Pt continues to amb. with gait deviations including internal rotation of LLE with Lt pelvic protraction.  Shoe lift (3/8") recommended due to apparent LLD, with use of shoe lift to try to assist in reducing internal rotation of LLE.    Rehab Potential  Good    PT Frequency  1x / week    PT Duration  4 weeks    PT Next Visit Plan  cont LE strengthening; balance and gait training    Consulted and Agree with Plan of Care  Patient       Patient will benefit from skilled therapeutic intervention in order to improve the following deficits and impairments:  Abnormal gait, Decreased balance, Decreased strength, Decreased activity tolerance, Decreased endurance, Decreased safety awareness, Pain, Decreased coordination  Visit Diagnosis: 1. Other abnormalities of gait and mobility   2. Muscle weakness (generalized)   3. Unsteadiness on feet        Problem List Patient Active Problem List   Diagnosis Date Noted  . Glucosuria 06/01/2018  . Steroid-induced diabetes ([place]) 06/01/2018  . Not well controlled moderate persistent asthma 04/29/2018  . Gastroesophageal reflux disease 04/29/2018  . Recurrent infections 04/29/2018  . Dysuria 02/20/2018  . Chronic rhinitis 04/03/2017  . Recurrent falls 03/21/2017  . Weakness 03/21/2017  . Venous insufficiency 03/21/2017  . Excessive daytime sleepiness 03/05/2016  . 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 Cough variant asthma vs UACS/ vcd  03/15/2015  . Autism spectrum disorder 02/14/2015  . Nausea with vomiting 02/14/2015  .  Obesity, morbid ([place]) 12/13/2014  . 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 Acanthosis nigricans 11/14/2014  . Toeing-in 08/29/2014  . Generalized convulsive epilepsy ([place]) 09/28/2012  . Partial epilepsy with impairment of consciousness ([place]) 09/28/2012  . 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 Recurrent urinary tract infection 08/01/2011  . Asthma 08/01/2011  . Chronic constipation 08/01/2011  . Contraception 08/01/2011    Alda Lea, [place] 11/03/2018, 9:14 PM  [facility] [address] [place], [place], [address] Phone: [phone]   Fax:  [phone]  Name: DERITA MICHELSEN MRN: [id number] Date of Birth: Oct 28, 1995

## 2018-11-06 ENCOUNTER — Other Ambulatory Visit (INDEPENDENT_AMBULATORY_CARE_PROVIDER_SITE_OTHER): Payer: Self-pay | Admitting: Family

## 2018-11-06 ENCOUNTER — Telehealth: Payer: Self-pay | Admitting: Physical Therapy

## 2018-11-06 DIAGNOSIS — M205X9 Other deformities of toe(s) (acquired), unspecified foot: Secondary | ICD-10-CM

## 2018-11-06 DIAGNOSIS — R296 Repeated falls: Secondary | ICD-10-CM

## 2018-11-06 DIAGNOSIS — R269 Unspecified abnormalities of gait and mobility: Secondary | ICD-10-CM

## 2018-11-06 NOTE — Telephone Encounter (Signed)
Lori Miles returns to PT after being placed on hold for 6 weeks due to lack of availibility of early am appt times with  primary PT, Vinnie Level [name]. We trialed a shoe lift (3/8") for LLE which appeared to help in slightly reducing the internal rotation of her left leg in swing phase of gait.  Could we please get an order for a 3/8" shoe lift and also for an orthotic consult for LLE? If you agree, please place order in Epic and we will refer her to an orthotist for evaluation of appropriate orthosis and to obtain the shoe lift.  Thank you, Guido Sander, PT

## 2018-11-07 NOTE — Progress Notes (Signed)
Reviewed and agree with assessment/plan.   Kaikoa Magro, MD [facility] 03/13/2016, 12:24 PM Pager:  [phone]  

## 2018-11-09 ENCOUNTER — Ambulatory Visit: Payer: Medicare Other | Admitting: Physical Therapy

## 2018-11-09 ENCOUNTER — Other Ambulatory Visit: Payer: Self-pay

## 2018-11-09 DIAGNOSIS — R2689 Other abnormalities of gait and mobility: Secondary | ICD-10-CM | POA: Diagnosis not present

## 2018-11-09 DIAGNOSIS — R2681 Unsteadiness on feet: Secondary | ICD-10-CM | POA: Diagnosis not present

## 2018-11-09 DIAGNOSIS — M6281 Muscle weakness (generalized): Secondary | ICD-10-CM

## 2018-11-10 ENCOUNTER — Other Ambulatory Visit: Payer: Self-pay | Admitting: *Deleted

## 2018-11-10 ENCOUNTER — Telehealth: Payer: Self-pay

## 2018-11-10 MED ORDER — YUPELRI 175 MCG/3ML IN SOLN: 1.0000 | Freq: Every day | RESPIRATORY_TRACT | 2 refills | Status: DC

## 2018-11-10 MED ORDER — NEBULIZER MISC: 5 refills | Status: DC

## 2018-11-10 NOTE — Therapy (Signed)
[facility] [address] [place], [place], [address] Phone: [phone]   Fax:  [phone]  Physical Therapy Treatment  Patient Details  Name: Lori Miles MRN: [id number] Date of Birth: 13-Sep-1995 Referring Provider (PT): Rockwell Germany, NP   Encounter Date: 11/09/2018  PT End of Session - 11/10/18 2146    Visit Number  12    Number of Visits  15    Date for PT Re-Evaluation  12/03/18    Authorization Type  [employer] Medicare    Authorization Time Period  04-14-18 - 07-11-18; 08-03-18 - 10-16-18:  11-02-18 - 01-02-19    PT Start Time  0802    PT Stop Time  0848    PT Time Calculation (min)  46 min    Equipment Utilized During Treatment  --   heel lift   Activity Tolerance  Patient tolerated treatment well    Behavior During Therapy  [facility] for tasks assessed/performed       Past Medical History:  Diagnosis Date  . Asthma   . Autism    mom states no actual dx, but admits she has some symptoms  . Bacteriuria   . Chronic constipation   . Cognitive developmental delay   . Disruptive behavior disorder   . Family history of adverse reaction to anesthesia    mother had itching after anesthesia one time  . Frequency-urgency syndrome   . Gait disorder    ORGANIC PER NERUOLGIST NOTE (DR HICKLING)  . Generalized convulsive epilepsy ([place]) NEUROLOGIST-  DR HICKLING  . GERD (gastroesophageal reflux disease)   . Gout   . H/O sinus tachycardia   . History of acute respiratory failure 02/14/2015   SECONDARY TO CAP  . History of recurrent UTIs   . Interstitial cystitis   . Moderate intellectual disability   . OSA (obstructive sleep apnea)    no machine yet   . Partial epilepsy with impairment of consciousness ([place])    FOLLOWED BY DR HICKLING  . Pneumonia 2018 last time  . PONV (postoperative nausea and vomiting)   . Seizure ([place])    most recent sz " at the end of summer"-last sz years ago per mother  . Urinary  incontinence     Past Surgical History:  Procedure Laterality Date  . CYSTOSCOPY N/A 06/03/2017   Procedure: CYSTOSCOPY WITH EXAM UNDER ANESTHESIA;  Surgeon: Festus Aloe, MD;  Location: [facility];  Service: Urology;  Laterality: N/A;  . CYSTOSCOPY WITH HYDRODISTENSION AND BIOPSY  04/14/2012   Procedure: CYSTOSCOPY/BIOPSY/HYDRODISTENSION;  Surgeon: Hanley Ben, MD;  Location: [facility];  Service: Urology;;  instillation of marcaine and pyridium  . EUA/  PAP SMEAR/  BREAST EXAM  03-12-2016   dr Ihor Dow [facility]  . EUA/ VAGINOSCOPY/ COLPOSCOPY FO THE HYMENAL RING  10-04-2009    dr Delsa Sale  [facility]  . TONSILLECTOMY      There were no vitals filed for this visit.  Subjective Assessment - 11/10/18 2133    Subjective  [name] states pt had episode of LE's  trembling and both legs turning inward when they were in a store on Saturday - says she had to turn her legs straight for her to prevent her from falling    Patient is accompained by:  Family member    Pertinent History  Cognitive developmentally delayed:  recurrent falls:  weakness:  tremor, unspecified:  generalized convulsive epilepsy;  autism spectrum disorder    Patient Stated Goals  improve walking and try to stop the tremors    Currently in Pain?  No/denies                       [facility] Adult PT Treatment/Exercise - 11/10/18 0001      Ambulation/Gait   Ambulation/Gait  Yes    Ambulation/Gait Assistance  4: Min guard    Ambulation Distance (Feet)  115 Feet   120' with rollator   Gait Pattern  Step-through pattern   LLE internally rotates with foot flat contact at stance   Ambulation Surface  Level;Indoor    Gait Comments  practiced locking & unlocking brakes on rollator with turning to sit down on rollator and then sit to stand from rollator       Knee/Hip Exercises: Aerobic   Recumbent Bike  SciFit level 1.5 x 3"          Balance Exercises - 11/10/18 2139       Balance Exercises: Standing   Rockerboard  Anterior/posterior;10 reps;UE support  (Pended)        Pt performed stepping on stepping stones, touching balance bubbles for improved SLS and stepping over and back of Balance beam 5 reps each leg with min assist      PT Long Term Goals - 11/10/18 2150      PT LONG TERM GOAL #1   Title  Pt. will amb. 500' on flat, even surface without LOB.      Baseline  230' on 08-03-18 with hand held assist - pt able to amb. without assist    Time  4    Period  Weeks    Status  On-going      PT LONG TERM GOAL #2   Title  Pt will perform at least 10" on recumbent bike nonstop for improved endurance/activity tolerance.    Baseline  6 1/2" on 05-25-18;pt performed 5" on 08-03-18    Time  4    Period  Weeks    Status  On-going      PT LONG TERM GOAL #3   Title  Mother will verbalize understanding of HEP to assist with LE strengthening.      Baseline  met 05-25-18    Status  Achieved      PT LONG TERM GOAL #4   Title  Perform sit to stand 10 reps without UE support from mat to demo increased bil. LE strength.    Baseline  5 reps performed on 08-03-18    Time  4    Period  Weeks    Status  New      PT LONG TERM GOAL #5   Title  Orthotic consult and shoe lift for LLE will be obtained for improved gait pattern.    Time  4    Period  Weeks    Status  New            Plan - 11/10/18 2147    Clinical Impression Statement  Use of rollator is beneficial for patient in increasing safety and stability with ambulation and with use of seat to have for seated rest periods when pt fatigues, resulting in episodes of tremors and legs internally rotating.  [name] requests order for rollator to have for assistance with community ambulation.    Rehab Potential  Good    PT Frequency  1x / week    PT Duration  4 weeks    PT Treatment/Interventions  ADLs/Self Care Home  Management;DME Instruction;Gait training;Stair training;Orthotic  Fit/Training;Patient/family education;Neuromuscular re-education;Balance training;Therapeutic exercise;Therapeutic activities    PT Next Visit Plan  cont LE strengthening; balance and gait training    Recommended Other Services  request order for rollator    Consulted and Agree with Plan of Care  Patient       Patient will benefit from skilled therapeutic intervention in order to improve the following deficits and impairments:  Abnormal gait, Decreased balance, Decreased strength, Decreased activity tolerance, Decreased endurance, Decreased safety awareness, Pain, Decreased coordination  Visit Diagnosis: Other abnormalities of gait and mobility  Muscle weakness (generalized)  Unsteadiness on feet     Problem List Patient Active Problem List   Diagnosis Date Noted  . Glucosuria 06/01/2018  . Steroid-induced diabetes ([place]) 06/01/2018  . Not well controlled moderate persistent asthma 04/29/2018  . Gastroesophageal reflux disease 04/29/2018  . Recurrent infections 04/29/2018  . Dysuria 02/20/2018  . Chronic rhinitis 04/03/2017  . Recurrent falls 03/21/2017  . Weakness 03/21/2017  . Venous insufficiency 03/21/2017  . Excessive daytime sleepiness 03/05/2016  . 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 Cough variant asthma vs UACS/ vcd  03/15/2015  . Autism spectrum disorder 02/14/2015  . Nausea with vomiting 02/14/2015  . Obesity, morbid ([place]) 12/13/2014  . 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 Acanthosis nigricans 11/14/2014  . Toeing-in 08/29/2014  . Generalized convulsive epilepsy ([place]) 09/28/2012  . Partial epilepsy with impairment of consciousness ([place]) 09/28/2012  . 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 Recurrent urinary tract infection 08/01/2011  . Asthma 08/01/2011  . Chronic constipation 08/01/2011  . Contraception 08/01/2011    Alda Lea,  PT 11/10/2018, 9:52 PM  [facility] [address] [place], [place], [address] Phone: [phone]   Fax:  [phone]  Name: LAURALIE BLACKSHER MRN: [id number] Date of Birth: September 19, 1995

## 2018-11-10 NOTE — Telephone Encounter (Signed)
Patient's mom, [name]. She was concerned about the Yupelri not being paid for with [employer] anymore. I explained to mom that I would reach out to [facility] and find out what was going on. I have spoken with Estill Bamberg at [facility] in [place]. She told me that the patient has medicaid and medicaid does not cover the leftover 20% when the prescription is filled with [employer]. I spoke with mom and informed her of this information. Mom also requested a new prescription for Marcey's adult mask/tubing kits for the nebulizer. I am sending this to the patient's regular pharmacy.

## 2018-11-10 NOTE — Telephone Encounter (Signed)
Patient's mom call and stated that [facility] told her Yu[place]i needed to start going to her local pharmacy. Sent in refill to [facility].

## 2018-11-12 ENCOUNTER — Other Ambulatory Visit: Payer: Self-pay | Admitting: Internal Medicine

## 2018-11-12 ENCOUNTER — Other Ambulatory Visit: Payer: Self-pay | Admitting: Allergy and Immunology

## 2018-11-13 ENCOUNTER — Other Ambulatory Visit: Payer: Self-pay | Admitting: Internal Medicine

## 2018-11-13 ENCOUNTER — Telehealth: Payer: Self-pay

## 2018-11-13 ENCOUNTER — Other Ambulatory Visit: Payer: Self-pay | Admitting: Allergy and Immunology

## 2018-11-13 ENCOUNTER — Telehealth: Payer: Self-pay | Admitting: Internal Medicine

## 2018-11-13 ENCOUNTER — Telehealth: Payer: Self-pay | Admitting: Physical Therapy

## 2018-11-13 DIAGNOSIS — G40209 Localization-related (focal) (partial) symptomatic epilepsy and epileptic syndromes with complex partial seizures, not intractable, without status epilepticus: Secondary | ICD-10-CM

## 2018-11-13 DIAGNOSIS — R296 Repeated falls: Secondary | ICD-10-CM

## 2018-11-13 DIAGNOSIS — F819 Developmental disorder of scholastic skills, unspecified: Secondary | ICD-10-CM

## 2018-11-13 DIAGNOSIS — G40309 Generalized idiopathic epilepsy and epileptic syndromes, not intractable, without status epilepticus: Secondary | ICD-10-CM

## 2018-11-13 NOTE — Telephone Encounter (Signed)
Patient's mom called to inform us that she only received 2 boxes of Pulmicort nebulizer solution. I let her know that this was due to her being overdue for an office visit/telephone visit. Mom acknowledged understanding of this and she has been scheduled for a telephone visit with Webb Silversmith on 11-20-2018. Per Dr. Bruna Potter last note patient is to use the Pulmicort 2 times daily except during flares. She can then use it 4 times daily. Mom told me that she had to increase it over the past 3 days because Saidi is currently battling recurrent urinary tract infections and when she has the bad ones it seems to interfere with her asthma. She then has to increase her asthma meds as instructed by Dr. Neldon Mc. Patient's mom told me that when they only get 2 boxes they always run out before they are able to get a refill. I told her that I would make a note in the chart about the amount of boxes that they needed.

## 2018-11-13 NOTE — Telephone Encounter (Signed)
Patient's mother is concerned because pharmacy only gave 1 bottle of miralax instead of the 2 she normally gets. I explained that we sent script for 1,020 grams and have not wavered from that. In fact, we just sent another refill for 1,020 grams. I advised that she make sure pharmacy is giving her the correct amount.

## 2018-11-13 NOTE — Telephone Encounter (Signed)
Pls call pt's mother [name], she needs some clarification on rf for polyethylene, she stated that rf was only for one bottle but pt usually gets two.

## 2018-11-13 NOTE — Telephone Encounter (Signed)
Lori Miles, Lori Miles's mother reports that [place] had episode of tremors/instability with near fall while shopping last weekend.  We trialed a rollator with a seat which was beneficial for [name] to have for assistance with ambulation as well as to have a seat readily available during episodes of instability and shakiness. I would like to request an order for a rollator with a seat for her - if you agree would you please place an order in Epic for rollator and we will assist her in obtaining this DME. Mother states she may hold on getting the shoe lift (previously requested) at this time as we are uncertain that it will make a significant difference in [name]'s gait. Please contact me with any questions.  Thank you so much, Guido Sander, [place] [phone]

## 2018-11-16 ENCOUNTER — Other Ambulatory Visit: Payer: Self-pay

## 2018-11-16 ENCOUNTER — Ambulatory Visit: Payer: Medicare Other | Admitting: Physical Therapy

## 2018-11-16 DIAGNOSIS — R2681 Unsteadiness on feet: Secondary | ICD-10-CM | POA: Diagnosis not present

## 2018-11-16 DIAGNOSIS — M6281 Muscle weakness (generalized): Secondary | ICD-10-CM

## 2018-11-16 DIAGNOSIS — R2689 Other abnormalities of gait and mobility: Secondary | ICD-10-CM

## 2018-11-16 NOTE — Therapy (Signed)
[facility] [address] [place], [place], [address] Phone: [phone]   Fax:  [phone]  Physical Therapy Treatment  Patient Details  Name: Lori Miles MRN: [id number] Date of Birth: Apr 30, 1995 Referring Provider (PT): Rockwell Germany, NP   Encounter Date: 11/16/2018  PT End of Session - 11/16/18 2024    Visit Number  14    Number of Visits  15    Date for PT Re-Evaluation  12/03/18    Authorization Type  [employer] Medicare    Authorization Time Period  04-14-18 - 07-11-18; 08-03-18 - 10-16-18:  11-02-18 - 01-02-19    PT Start Time  0801    PT Stop Time  0846    PT Time Calculation (min)  45 min    Equipment Utilized During Treatment  --   heel lift   Activity Tolerance  Patient tolerated treatment well    Behavior During Therapy  [facility] for tasks assessed/performed       Past Medical History:  Diagnosis Date  . Asthma   . Autism    mom states no actual dx, but admits she has some symptoms  . Bacteriuria   . Chronic constipation   . Cognitive developmental delay   . Disruptive behavior disorder   . Family history of adverse reaction to anesthesia    mother had itching after anesthesia one time  . Frequency-urgency syndrome   . Gait disorder    ORGANIC PER NERUOLGIST NOTE (DR HICKLING)  . Generalized convulsive epilepsy ([place]) NEUROLOGIST-  DR HICKLING  . GERD (gastroesophageal reflux disease)   . Gout   . H/O sinus tachycardia   . History of acute respiratory failure 02/14/2015   SECONDARY TO CAP  . History of recurrent UTIs   . Interstitial cystitis   . Moderate intellectual disability   . OSA (obstructive sleep apnea)    no machine yet   . Partial epilepsy with impairment of consciousness ([place])    FOLLOWED BY DR HICKLING  . Pneumonia 2018 last time  . PONV (postoperative nausea and vomiting)   . Seizure ([place])    most recent sz " at the end of summer"-last sz years ago per mother  . Urinary  incontinence     Past Surgical History:  Procedure Laterality Date  . CYSTOSCOPY N/A 06/03/2017   Procedure: CYSTOSCOPY WITH EXAM UNDER ANESTHESIA;  Surgeon: Festus Aloe, MD;  Location: [facility];  Service: Urology;  Laterality: N/A;  . CYSTOSCOPY WITH HYDRODISTENSION AND BIOPSY  04/14/2012   Procedure: CYSTOSCOPY/BIOPSY/HYDRODISTENSION;  Surgeon: Hanley Ben, MD;  Location: [facility];  Service: Urology;;  instillation of marcaine and pyridium  . EUA/  PAP SMEAR/  BREAST EXAM  03-12-2016   dr Ihor Dow [facility]  . EUA/ VAGINOSCOPY/ COLPOSCOPY FO THE HYMENAL RING  10-04-2009    dr Delsa Sale  [facility]  . TONSILLECTOMY      There were no vitals filed for this visit.  Subjective Assessment - 11/16/18 2021    Subjective  [name] states pt still has UTI:  pt is requesting LMN for recumbent bike and for bedside table - I informed mother that I will contact her [employer] case manager, Oletta Cohn for approval of these items and wil write LMN upon receipt of quote    Patient is accompained by:  Family member    Pertinent History  Cognitive developmentally delayed:  recurrent falls:  weakness:  tremor, unspecified:  generalized convulsive epilepsy;  autism spectrum disorder  Patient Stated Goals  improve walking and try to stop the tremors    Currently in Pain?  No/denies                                    PT Long Term Goals - 11/16/18 2025      PT LONG TERM GOAL #1   Title  Pt. will amb. 500' on flat, even surface without LOB.      Baseline  230' on 08-03-18 with hand held assist - pt able to amb. without assist    Time  4    Period  Weeks    Status  On-going      PT LONG TERM GOAL #2   Title  Pt will perform at least 10" on recumbent bike nonstop for improved endurance/activity tolerance.    Baseline  6 1/2" on 05-25-18;pt performed 5" on 08-03-18    Time  4    Period  Weeks    Status  On-going      PT LONG TERM  GOAL #3   Title  Mother will verbalize understanding of HEP to assist with LE strengthening.      Baseline  met 05-25-18    Status  Achieved      PT LONG TERM GOAL #4   Title  Perform sit to stand 10 reps without UE support from mat to demo increased bil. LE strength.    Baseline  5 reps performed on 08-03-18    Time  4    Period  Weeks    Status  New      PT LONG TERM GOAL #5   Title  Orthotic consult and shoe lift for LLE will be obtained for improved gait pattern.    Time  4    Period  Weeks    Status  New              Patient will benefit from skilled therapeutic intervention in order to improve the following deficits and impairments:     Visit Diagnosis: Other abnormalities of gait and mobility  Muscle weakness (generalized)  Unsteadiness on feet     Problem List Patient Active Problem List   Diagnosis Date Noted  . Glucosuria 06/01/2018  . Steroid-induced diabetes ([place]) 06/01/2018  . Not well controlled moderate persistent asthma 04/29/2018  . Gastroesophageal reflux disease 04/29/2018  . Recurrent infections 04/29/2018  . Dysuria 02/20/2018  . Chronic rhinitis 04/03/2017  . Recurrent falls 03/21/2017  . Weakness 03/21/2017  . Venous insufficiency 03/21/2017  . Excessive daytime sleepiness 03/05/2016  . 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 Cough variant asthma vs UACS/ vcd  03/15/2015  . Autism spectrum disorder 02/14/2015  . Nausea with vomiting 02/14/2015  . Obesity, morbid ([place]) 12/13/2014  . 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 Acanthosis nigricans 11/14/2014  . Toeing-in 08/29/2014  . Generalized convulsive epilepsy ([place]) 09/28/2012  . Partial epilepsy with impairment of consciousness ([place]) 09/28/2012  . 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 Recurrent urinary tract infection 08/01/2011  . Asthma 08/01/2011  . Chronic constipation  08/01/2011  . Contraception 08/01/2011    Alda Lea, [place] 11/16/2018, 8:27 PM  [facility] [address] [place], [place], [address] Phone: [phone]   Fax:  [phone]  Name: Lori Miles MRN:  [id number] Date of Birth: Jul 06, 1995

## 2018-11-18 DIAGNOSIS — R569 Unspecified convulsions: Secondary | ICD-10-CM | POA: Diagnosis not present

## 2018-11-18 MED ORDER — NEBULIZER MISC: 5 refills | Status: DC

## 2018-11-18 MED ORDER — NEBULIZER MISC: 12 refills | Status: AC

## 2018-11-18 NOTE — Telephone Encounter (Signed)
I spoke with Iona Beard at [facility]. He assured me that they would get the adult nebulizer masks to the patient tonight or tomorrow. I have let the patient's mom know this as well.

## 2018-11-18 NOTE — Addendum Note (Signed)
Addended by: Lucrezia Starch I on: 11/18/2018 04:41 PM   Modules accepted: Orders

## 2018-11-18 NOTE — Addendum Note (Signed)
Addended by: Lucrezia Starch I on: 11/18/2018 01:20 PM   Modules accepted: Orders

## 2018-11-18 NOTE — Telephone Encounter (Signed)
[facility] never ordered the adult neb masks so I have sent them to [facility]. Patient's mom is aware.

## 2018-11-19 DIAGNOSIS — J45909 Unspecified asthma, uncomplicated: Secondary | ICD-10-CM | POA: Diagnosis not present

## 2018-11-20 ENCOUNTER — Ambulatory Visit (INDEPENDENT_AMBULATORY_CARE_PROVIDER_SITE_OTHER): Payer: Medicare Other | Admitting: Family Medicine

## 2018-11-20 ENCOUNTER — Other Ambulatory Visit: Payer: Self-pay

## 2018-11-20 ENCOUNTER — Encounter: Payer: Self-pay | Admitting: Family Medicine

## 2018-11-20 DIAGNOSIS — J310 Chronic rhinitis: Secondary | ICD-10-CM

## 2018-11-20 DIAGNOSIS — J4540 Moderate persistent asthma, uncomplicated: Secondary | ICD-10-CM | POA: Diagnosis not present

## 2018-11-20 DIAGNOSIS — K219 Gastro-esophageal reflux disease without esophagitis: Secondary | ICD-10-CM

## 2018-11-20 MED ORDER — BUDESONIDE 1 MG/2ML IN SUSP: RESPIRATORY_TRACT | 5 refills | Status: DC

## 2018-11-20 NOTE — Progress Notes (Signed)
RE: Lori Miles MRN: [id number] DOB: 03/11/1996 Date of Telemedicine Visit: 11/20/2018  Referring provider: No ref. provider found Primary care provider: Patient, No Pcp Per  Chief Complaint: Asthma   Telemedicine Follow Up Visit via Telephone: I connected with Lacinda Bussa for a follow up on 11/20/18 by telephone and verified that I am speaking with the correct person using two identifiers.   I discussed the limitations, risks, security and privacy concerns of performing an evaluation and management service by telephone and the availability of in person appointments. I also discussed with the patient that there may be a patient responsible charge related to this service. The patient expressed understanding and agreed to proceed.  Patient is at home accompanied by her mother who provided/contributed to the history.  Provider is at the office.  Visit start time: 9:52 Visit end time: 9:33 Insurance consent/check in by: Rosalita Levan Medical consent and medical assistant/nurse: Malcolm Metro  History of Present Illness: She is a 23 y.o. female, who is being followed for asthma, allergic rhinitis, and reflux. Her previous allergy office visit was on 06/09/2018 with Dr. Lucie Leather. At today's visit, her mother reports that Lori Miles's asthma has been moderately well controlled with some shortness of breath and wheezing for which she takes montelukast 10 mg once a day, budesonide 1 mg via nebulizer, Perforomist twice a day, and Yupelri once a day. She occasionally increases budesonide to 4 times a day for burst therapy with relief of symptoms. Allergic rhinitis is reported as well controlled with cetirizine once a day, saline rinses and saline gel frequently, and occasional use of Nasonex. Reflux is reported as well controlled with famotidine 20 mg twice a day. Her current medications are listed in the chart.  Assessment and Plan: Halsey is a 23 y.o. female with: Patient Instructions    1. Continue a combination of the following:   A. budesonide 1 mg nebulized twice a day  B. Perforomist nebulized twice a day  C. Yupelri nebulized daily  C. Nasonex one spray daily  D. montelukast 10 mg daily  E. cetirizine 10 mg daily  2. Continue to Treat reflux with a combination of the following:   A. Famotidine 40mg  twice a day  B. No caffeine or chocolate consumption  3. Continue albuterol nebulization every 4-6 hours if needed  4. "Action plan" for asthma flare up:   A. increase budesonide to 4 times a day  5. Attempt to remain away from prednisone in the future.  6. Return to clinic in 3 months or earlier if problem   Return in about 3 months (around 02/19/2019), or if symptoms worsen or fail to improve.  Meds ordered this encounter  Medications  . budesonide (PULMICORT) 1 MG/2ML nebulizer solution    Sig: Inhale 1 vial 4 times per day, during upper respiratory infections and asthma flares for 1-2 weeks    Dispense:  240 mL    Refill:  5    Medication List:  Current Outpatient Medications  Medication Sig Dispense Refill  . acetaminophen (TYLENOL) 325 MG tablet Take 2 tablets (650 mg total) by mouth every 6 (six) hours as needed for mild pain or moderate pain. 60 tablet 0  . albuterol (PROVENTIL) (2.5 MG/3ML) 0.083% nebulizer solution INHALE CONTENTS OF 1 VIAL IN NEBULIZER EVERY 4 TO 6 HOURS IF NEEDED FOR COUGH OR WHEEZING (Patient taking differently: Take 2.5 mg by nebulization every 4 (four) hours as needed for wheezing or shortness of breath. ) 150 mL 5  .  benzonatate (TESSALON) 200 MG capsule TAKE 1 CAPSULE(200 MG) BY MOUTH THREE TIMES DAILY AS NEEDED (Patient taking differently: Take 200 mg by mouth 3 (three) times daily as needed for cough. ) 60 capsule 0  . budesonide (PULMICORT) 1 MG/2ML nebulizer solution Inhale 1 vial 4 times per day, during upper respiratory infections and asthma flares for 1-2 weeks 240 mL 5  . cetirizine (ZYRTEC) 10 MG tablet TAKE 1  TABLET(10 MG) BY MOUTH DAILY 30 tablet 0  . clotrimazole-betamethasone (LOTRISONE) cream Apply to affected area 2 times daily prn (Patient taking differently: Apply 1 application topically daily. ) 45 g 0  . DEPAKOTE 500 MG DR tablet TAKE 1 TABLET BY MOUTH IN THE MORNING AND 2 AT NIGHT (Patient taking differently: Take 500-1,000 mg by mouth See admin instructions. Take 500 mg by mouth in the morning and 1000 mg by mouth at bedtime) 90 tablet 5  . famotidine (PEPCID) 20 MG tablet Take 1 tablet (20 mg total) by mouth every 12 (twelve) hours. NEEDS OFFICE VISIT FOR FURTHER REFILLS 60 tablet 0  . Levonorgestrel-Ethinyl Estradiol (SEASONIQUE) 0.15-0.03 &0.01 MG tablet Take 1 tablet by mouth daily. 3 Package 3  . Melatonin 3 MG TABS Take 3 mg by mouth at bedtime.     . montelukast (SINGULAIR) 10 MG tablet TAKE 1 TABLET(10 MG) BY MOUTH AT BEDTIME 30 tablet 0  . Nebulizer MISC Adult mask with tubing. Use as directed with nebulizer device. 4 each 12  . nitrofurantoin, macrocrystal-monohydrate, (MACROBID) 100 MG capsule     . ondansetron (ZOFRAN ODT) 4 MG disintegrating tablet Take 1 tablet (4 mg total) by mouth every 8 (eight) hours as needed for nausea. (Patient taking differently: Take 8 mg by mouth every 8 (eight) hours as needed for nausea. ) 10 tablet 0  . PERFOROMIST 20 MCG/2ML nebulizer solution PLACE 2 MLS BY NEBULIZATION 2 TIMES DAILY 120 mL 0  . polyethylene glycol powder (GLYCOLAX/MIRALAX) 17 GM/SCOOP powder Dissolve 17 grams (1 capful) in at least 8 ounces of water/juice and drink twice daily (MUST HAVE OFFICE VISIT FOR FURTHER REFILLS 1020 g 0  . Revefenacin (YUPELRI) 175 MCG/3ML SOLN Inhale 1 vial into the lungs daily. 270 mL 2  . senna (SENOKOT) 8.6 MG tablet Take 1 tablet by mouth at bedtime.     . traZODone (DESYREL) 50 MG tablet Take 2 tablets (100 mg total) by mouth at bedtime. 62 tablet 5   No current facility-administered medications for this visit.    Allergies: Allergies  Allergen  Reactions  . Abilify [Aripiprazole] Swelling and Palpitations  . Seroquel [Quetiapine Fumarate] Palpitations  . Bactrim [Sulfamethoxazole-Trimethoprim] Other (See Comments)    Abdominal pain  . Doxycycline Other (See Comments)    Stomach cramps  . Amoxicillin-Pot Clavulanate Hives    Has patient had a PCN reaction causing immediate rash, facial/tongue/throat swelling, SOB or lightheadedness with hypotension: No Has patient had a PCN reaction causing severe rash involving mucus membranes or skin necrosis: Yes Has patient had a PCN reaction that required hospitalization: No Has patient had a PCN reaction occurring within the last 10 years: No If all of the above answers are "NO", then may proceed with Cephalosporin use.   Marland Kitchen Keppra [Levetiracetam] Other (See Comments)    Aggressive behavior    I reviewed her past medical history, social history, family history, and environmental history and no significant changes have been reported from previous visit on 06/09/18.  Objective: Physical Exam Not obtained as encounter was done via telephone.  Previous notes and tests were reviewed.  I discussed the assessment and treatment plan with the patient. The patient was provided an opportunity to ask questions and all were answered. The patient agreed with the plan and demonstrated an understanding of the instructions.   The patient was advised to call back or seek an in-person evaluation if the symptoms worsen or if the condition fails to improve as anticipated.  I provided 31 minutes of non-face-to-face time during this encounter.  It was my pleasure to participate in Del AireJasmine Cicalese's care today. Please feel free to contact me with any questions or concerns.   Sincerely,  Thermon LeylandAnne Nahshon Reich, FNP

## 2018-11-20 NOTE — Patient Instructions (Signed)
  1. Continue a combination of the following:   A. budesonide 1 mg nebulized twice a day  B. Perforomist nebulized twice a day  C. Yupelri nebulized daily  C. Nasonex one spray daily  D. montelukast 10 mg daily  E. cetirizine 10 mg daily  2. Continue to Treat reflux with a combination of the following:   A. Famotidine 40mg  twice a day  B. No caffeine or chocolate consumption  3. Continue albuterol nebulization every 4-6 hours if needed  4. "Action plan" for asthma flare up:   A. increase budesonide to 4 times a day  5. Attempt to remain away from prednisone in the future.  6. Return to clinic in 3 months or earlier if problem

## 2018-11-30 ENCOUNTER — Ambulatory Visit: Payer: Medicare Other | Attending: Family | Admitting: Physical Therapy

## 2018-11-30 ENCOUNTER — Other Ambulatory Visit: Payer: Self-pay

## 2018-11-30 DIAGNOSIS — R2681 Unsteadiness on feet: Secondary | ICD-10-CM | POA: Diagnosis not present

## 2018-11-30 DIAGNOSIS — M6281 Muscle weakness (generalized): Secondary | ICD-10-CM | POA: Diagnosis not present

## 2018-11-30 DIAGNOSIS — R2689 Other abnormalities of gait and mobility: Secondary | ICD-10-CM | POA: Insufficient documentation

## 2018-12-01 NOTE — Therapy (Signed)
[facility] [address] [place], [place], [address] Phone: [phone]   Fax:  [phone]  Physical Therapy Treatment & Discharge Summary  Patient Details  Name: Lori Miles MRN: [id number] Date of Birth: [birth year] Referring Provider (PT): Rockwell Germany, NP   Encounter Date: 11/30/2018  PT End of Session - 12/01/18 2054    Visit Number  15    Number of Visits  15    Date for PT Re-Evaluation  12/03/18    Authorization Type  [employer] Medicare    Authorization Time Period  04-14-18 - 07-11-18; 08-03-18 - 10-16-18:  11-02-18 - 01-02-19    PT Start Time  0800    PT Stop Time  0846    PT Time Calculation (min)  46 min    Equipment Utilized During Treatment  --   heel lift   Activity Tolerance  Patient tolerated treatment well    Behavior During Therapy  [facility] for tasks assessed/performed       Past Medical History:  Diagnosis Date  . Asthma   . Autism    mom states no actual dx, but admits she has some symptoms  . Bacteriuria   . Chronic constipation   . Cognitive developmental delay   . Disruptive behavior disorder   . Family history of adverse reaction to anesthesia    mother had itching after anesthesia one time  . Frequency-urgency syndrome   . Gait disorder    ORGANIC PER NERUOLGIST NOTE (DR HICKLING)  . Generalized convulsive epilepsy ([place]) NEUROLOGIST-  DR HICKLING  . GERD (gastroesophageal reflux disease)   . Gout   . H/O sinus tachycardia   . History of acute respiratory failure 02/14/2015   SECONDARY TO CAP  . History of recurrent UTIs   . Interstitial cystitis   . Moderate intellectual disability   . OSA (obstructive sleep apnea)    no machine yet   . Partial epilepsy with impairment of consciousness ([place])    FOLLOWED BY DR HICKLING  . Pneumonia 2018 last time  . PONV (postoperative nausea and vomiting)   . Seizure ([place])    most recent sz " at the end of summer"-last sz years ago per mother   . Urinary incontinence     Past Surgical History:  Procedure Laterality Date  . CYSTOSCOPY N/A 06/03/2017   Procedure: CYSTOSCOPY WITH EXAM UNDER ANESTHESIA;  Surgeon: Festus Aloe, MD;  Location: [facility];  Service: Urology;  Laterality: N/A;  . CYSTOSCOPY WITH HYDRODISTENSION AND BIOPSY  04/14/2012   Procedure: CYSTOSCOPY/BIOPSY/HYDRODISTENSION;  Surgeon: Hanley Ben, MD;  Location: [place];  Service: Urology;;  instillation of marcaine and pyridium  . EUA/  PAP SMEAR/  BREAST EXAM  03-12-2016   dr Ihor Dow [facility]  . EUA/ VAGINOSCOPY/ COLPOSCOPY FO THE HYMENAL RING  10-04-2009    dr Delsa Sale  [facility]  . TONSILLECTOMY      There were no vitals filed for this visit.                    [place] Adult PT Treatment/Exercise - 12/01/18 0001      Ambulation/Gait   Ambulation/Gait  Yes    Ambulation/Gait Assistance  4: Min guard;5: Supervision    Ambulation Distance (Feet)  500 Feet    Gait Pattern  Step-through pattern   LLE internally rotates with foot flat contact at stance   Ambulation Surface  Level;Indoor    Stairs  Yes  Stairs Assistance  5: Supervision    Stair Management Technique  Two rails;Alternating pattern;Step to pattern   step to pattern with descension   Number of Stairs  4    Height of Stairs  6      Knee/Hip Exercises: Aerobic   Recumbent Bike  SciFit level 1.5 x 7" 15 secs   goal was for 10" - pt stopped after 7:15          Balance Exercises - 12/01/18 2050      Balance Exercises: Standing   Rockerboard  Anterior/posterior;10 reps;UE support    Other Standing Exercises  Pt performed agility ladder exercise to facilitate incr. step length and improved SLS; performed cone taps (4 cones) used and touched balance bubbles (2) for improved coordination and SLS on each leg       6 various heights stepping stones used for dynamic standing balance activity - min to mod hand held assist provided Due  to pt having some fear of falling with this activity   PT Education - 12/01/18 2052    Education Details  mother requested names/info for agility ladder, colored discs, and stepping stones used for balance activities in case she decides to purchase for home use    Person(s) Educated  Parent(s)    Methods  Explanation    Comprehension  Verbalized understanding          PT Long Term Goals - 11/30/18 0807      PT LONG TERM GOAL #1   Title  Pt. will amb. 500' on flat, even surface without LOB.      Baseline  230' on 08-03-18 with hand held assist - pt able to amb. without assist    Status  Achieved      PT LONG TERM GOAL #2   Title  Pt will perform at least 10" on recumbent bike nonstop for improved endurance/activity tolerance.    Baseline  6 1/2" on 05-25-18;pt performed 5" on 08-03-18; 7" 15 secs on bike - pt decided to stop after this amount of time - 11-30-18    Status  Not Met      PT LONG TERM GOAL #3   Title  Mother will verbalize understanding of HEP to assist with LE strengthening.      Status  Achieved      PT LONG TERM GOAL #4   Title  Perform sit to stand 10 reps without UE support from mat to demo increased bil. LE strength.    Baseline  met 11-30-18    Status  Achieved      PT LONG TERM GOAL #5   Title  Orthotic consult and shoe lift for LLE will be obtained for improved gait pattern.    Baseline  to be scheduled  - 11-30-18    Status  On-going            Plan - 12/01/18 2055    Clinical Impression Statement  Pt has met LTG's #1,3, 4 & 5:  LTG #2 not met as pt stopped after performing 7" 15 secs on SciFit (not 10" per stated goal):  Orders for rollator and AFO have been obtained from NP, Rockwell Germany, and these orders have been given to pt's mother. She has not yet obtained orthotic consult for AFO and has not yet obtained rollator as of this time, but states she plans to obtain both AFO and RW.  Pt is discharged due to plateau in functional status at this time  -  no further needs identified which warrant skilled PT intervention.    Rehab Potential  Good    PT Frequency  1x / week    PT Duration  4 weeks    PT Treatment/Interventions  ADLs/Self Care Home Management;DME Instruction;Gait training;Stair training;Orthotic Fit/Training;Patient/family education;Neuromuscular re-education;Balance training;Therapeutic exercise;Therapeutic activities    PT Next Visit Plan  cont LE strengthening; balance and gait training    Consulted and Agree with Plan of Care  Patient       Patient will benefit from skilled therapeutic intervention in order to improve the following deficits and impairments:  Abnormal gait, Decreased balance, Decreased strength, Decreased activity tolerance, Decreased endurance, Decreased safety awareness, Pain, Decreased coordination  Visit Diagnosis: Other abnormalities of gait and mobility  Muscle weakness (generalized)  Unsteadiness on feet     Problem List Patient Active Problem List   Diagnosis Date Noted  . Glucosuria 06/01/2018  . Steroid-induced diabetes ([place]) 06/01/2018  . Not well controlled moderate persistent asthma 04/29/2018  . Gastroesophageal reflux disease 04/29/2018  . Recurrent infections 04/29/2018  . Dysuria 02/20/2018  . Chronic rhinitis 04/03/2017  . Recurrent falls 03/21/2017  . Weakness 03/21/2017  . Venous insufficiency 03/21/2017  . Excessive daytime sleepiness 03/05/2016  . 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 Cough variant asthma vs UACS/ vcd  03/15/2015  . Autism spectrum disorder 02/14/2015  . Nausea with vomiting 02/14/2015  . Obesity, morbid ([name]) 12/13/2014  . 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 Acanthosis nigricans 11/14/2014  . Toeing-in 08/29/2014  . Generalized convulsive epilepsy ([place]) 09/28/2012  . Partial epilepsy with impairment of consciousness ([place]) 09/28/2012  .  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 Recurrent urinary tract infection 08/01/2011  . Asthma 08/01/2011  . Chronic constipation 08/01/2011  . Contraception 08/01/2011     PHYSICAL THERAPY DISCHARGE SUMMARY  Visits from Start of Care: 15  Current functional level related to goals / functional outcomes: See above for progress towards LTG's   Remaining deficits: Continued gait deviations with LLE internally rotating; continued intermittent tremors noted - no pattern determined as to the cause of these tremors Continued decreased standing balance  Education / Equipment: Pt has been instructed in HEP consisting of strengthening exercises and balance activities, including giving pt Zoom ball toy for activity to fascilitate improved standing endurance and balance and UE ROM & strengthening  Plan: Patient agrees to discharge.  Patient goals were partially met. Patient is being discharged due to meeting the stated rehab goals.  ?????        Pt has maximized functional progress at this time; mother has been informed that pt may return to PT if orthosis (AFO) is obtained, and gait training with new orthosis will be completed. Unable to continue to see patient on on-going basis as it would be considered maintenance therapy at this time. Mother verbalized understanding.   Alda Lea, PT 12/01/2018, 9:06 PM  [facility] [address] [place], [place], [address] Phone: [phone]   Fax:  [phone]  Name: ZELA SOBIESKI MRN: [id number] Date of Birth: 25-Jun-1995

## 2018-12-02 DIAGNOSIS — N390 Urinary tract infection, site not specified: Secondary | ICD-10-CM | POA: Diagnosis not present

## 2018-12-08 ENCOUNTER — Telehealth: Payer: Self-pay | Admitting: Internal Medicine

## 2018-12-08 MED ORDER — FAMOTIDINE 20 MG PO TABS: 20.0000 mg | ORAL_TABLET | Freq: Two times a day (BID) | ORAL | 0 refills | Status: DC

## 2018-12-08 MED ORDER — FAMOTIDINE 20 MG PO TABS: 20.0000 mg | ORAL_TABLET | Freq: Two times a day (BID) | ORAL | 1 refills | Status: DC

## 2018-12-08 NOTE — Telephone Encounter (Signed)
Rx sent 

## 2018-12-08 NOTE — Addendum Note (Signed)
Addended by: Larina Bras on: 12/08/2018 06:14 PM   Modules accepted: Orders

## 2018-12-08 NOTE — Telephone Encounter (Signed)
Pt is scheduled for 01/21/19 telephone visit and requested a refill on famotadine.

## 2018-12-10 ENCOUNTER — Other Ambulatory Visit: Payer: Self-pay | Admitting: Allergy and Immunology

## 2018-12-10 DIAGNOSIS — R300 Dysuria: Secondary | ICD-10-CM | POA: Diagnosis not present

## 2018-12-10 DIAGNOSIS — R350 Frequency of micturition: Secondary | ICD-10-CM | POA: Diagnosis not present

## 2018-12-10 DIAGNOSIS — J0100 Acute maxillary sinusitis, unspecified: Secondary | ICD-10-CM | POA: Diagnosis not present

## 2018-12-11 DIAGNOSIS — R300 Dysuria: Secondary | ICD-10-CM | POA: Diagnosis not present

## 2018-12-18 DIAGNOSIS — R569 Unspecified convulsions: Secondary | ICD-10-CM | POA: Diagnosis not present

## 2018-12-21 DIAGNOSIS — J4520 Mild intermittent asthma, uncomplicated: Secondary | ICD-10-CM | POA: Diagnosis not present

## 2018-12-21 DIAGNOSIS — G809 Cerebral palsy, unspecified: Secondary | ICD-10-CM | POA: Diagnosis not present

## 2018-12-23 DIAGNOSIS — R110 Nausea: Secondary | ICD-10-CM | POA: Diagnosis not present

## 2018-12-23 DIAGNOSIS — N390 Urinary tract infection, site not specified: Secondary | ICD-10-CM | POA: Diagnosis not present

## 2018-12-23 DIAGNOSIS — J4540 Moderate persistent asthma, uncomplicated: Secondary | ICD-10-CM | POA: Diagnosis not present

## 2018-12-23 DIAGNOSIS — R05 Cough: Secondary | ICD-10-CM | POA: Diagnosis not present

## 2018-12-23 DIAGNOSIS — K219 Gastro-esophageal reflux disease without esophagitis: Secondary | ICD-10-CM | POA: Diagnosis not present

## 2018-12-23 NOTE — ED Provider Notes (Signed)
[facility]    CSN: [id number] Arrival date & time: 05/20/18  1656      History   Chief Complaint Chief Complaint  Patient presents with  . URI  . Urinary Tract Infection    HPI Lori Miles is a 23 y.o. female.   Pt's legal guardian concerned that she has UTI. Notes that the patient has frequent UTI's. The patient is non-cooperative but not rude.  She does not want to contribute substantively to history. Guardian states that urine is darker in color.      Past Medical History:  Diagnosis Date  . Asthma   . Autism    mom states no actual dx, but admits she has some symptoms  . Bacteriuria   . Chronic constipation   . Cognitive developmental delay   . Disruptive behavior disorder   . Family history of adverse reaction to anesthesia    mother had itching after anesthesia one time  . Frequency-urgency syndrome   . Gait disorder    ORGANIC PER NERUOLGIST NOTE (DR HICKLING)  . 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 GERD (gastroesophageal reflux disease)   . Gout   . H/O sinus tachycardia   . History of acute respiratory failure 02/14/2015   SECONDARY TO CAP  . History of recurrent UTIs   . Interstitial cystitis   . Moderate intellectual disability   . OSA (obstructive sleep apnea)    no machine yet   . Partial epilepsy with impairment of consciousness ([facility])    FOLLOWED BY DR HICKLING  . Pneumonia 2018 last time  . PONV (postoperative nausea and vomiting)   . Seizure ([facility])    most recent sz " at the end of summer"-last sz years ago per mother  . Urinary incontinence     Patient Active Problem List   Diagnosis Date Noted  . Glucosuria 06/01/2018  . 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 Not well controlled moderate persistent asthma 04/29/2018  . Gastroesophageal reflux disease 04/29/2018  . Recurrent infections 04/29/2018  . Dysuria 02/20/2018  . Chronic rhinitis 04/03/2017  . Recurrent falls 03/21/2017  .  Weakness 03/21/2017  . Venous insufficiency 03/21/2017  . Excessive daytime sleepiness 03/05/2016  . 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 Cough variant asthma vs UACS/ vcd  03/15/2015  . Autism spectrum disorder 02/14/2015  . Nausea with vomiting 02/14/2015  . Obesity, morbid ([facility]) 12/13/2014  . 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 Acanthosis nigricans 11/14/2014  . Toeing-in 08/29/2014  . 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 Recurrent urinary tract infection 08/01/2011  . Asthma 08/01/2011  . Chronic constipation 08/01/2011  . Contraception 08/01/2011    Past Surgical History:  Procedure Laterality Date  . CYSTOSCOPY N/A 06/03/2017   Procedure: CYSTOSCOPY WITH EXAM UNDER ANESTHESIA;  Surgeon: Jerilee Field, MD;  Location: [facility];  Service: Urology;  Laterality: N/A;  . CYSTOSCOPY WITH HYDRODISTENSION AND BIOPSY  04/14/2012   Procedure: CYSTOSCOPY/BIOPSY/HYDRODISTENSION;  Surgeon: Lindaann Slough, MD;  Location: [facility];  Service: Urology;;  instillation of marcaine and pyridium  . EUA/  PAP SMEAR/  BREAST EXAM  03-12-2016   dr Erin Fulling [facility]  . EUA/ VAGINOSCOPY/ COLPOSCOPY FO THE HYMENAL RING  10-04-2009    dr Tamela Oddi  [facility]  . TONSILLECTOMY  OB History   No obstetric history on file.      Home Medications    Prior to Admission medications   Medication Sig Start Date End Date Taking? Authorizing Provider  acetaminophen (TYLENOL) 325 MG tablet Take 2 tablets (650 mg total) by mouth every 6 (six) hours as needed for mild pain or moderate pain. 02/27/17   Waynetta Pean, PA-C  albuterol (PROVENTIL) (2.5 MG/3ML) 0.083% nebulizer solution INHALE CONTENTS OF 1 VIAL IN NEBULIZER EVERY 4 TO 6  HOURS IF NEEDED FOR COUGH OR WHEEZING Patient taking differently: Take 2.5 mg by nebulization every 4 (four) hours as needed for wheezing or shortness of breath.  03/17/18   Kozlow, Donnamarie Poag, MD  benzonatate (TESSALON) 200 MG capsule TAKE 1 CAPSULE(200 MG) BY MOUTH THREE TIMES DAILY AS NEEDED Patient taking differently: Take 200 mg by mouth 3 (three) times daily as needed for cough.  08/26/18   Hoyt Koch, MD  budesonide (PULMICORT) 1 MG/2ML nebulizer solution Inhale 1 vial 4 times per day, during upper respiratory infections and asthma flares for 1-2 weeks 11/20/18   Ambs, Kathrine Cords, FNP  cetirizine (ZYRTEC) 10 MG tablet TAKE 1 TABLET(10 MG) BY MOUTH DAILY 12/10/18   Kozlow, Donnamarie Poag, MD  clotrimazole-betamethasone (LOTRISONE) cream Apply to affected area 2 times daily prn Patient taking differently: Apply 1 application topically daily.  06/29/18   Robyn Haber, MD  DEPAKOTE 500 MG DR tablet TAKE 1 TABLET BY MOUTH IN THE MORNING AND 2 AT NIGHT Patient taking differently: Take 500-1,000 mg by mouth See admin instructions. Take 500 mg by mouth in the morning and 1000 mg by mouth at bedtime 09/07/18   Rockwell Germany, NP  famotidine (PEPCID) 20 MG tablet Take 1 tablet (20 mg total) by mouth every 12 (twelve) hours. NEEDS OFFICE VISIT FOR FURTHER REFILLS 12/08/18   Pyrtle, Lajuan Lines, MD  Levonorgestrel-Ethinyl Estradiol (SEASONIQUE) 0.15-0.03 &0.01 MG tablet Take 1 tablet by mouth daily. 01/29/18   Lajean Manes, CNM  Melatonin 3 MG TABS Take 3 mg by mouth at bedtime.     [provider]  montelukast (SINGULAIR) 10 MG tablet TAKE 1 TABLET(10 MG) BY MOUTH AT BEDTIME 12/10/18   Kozlow, Donnamarie Poag, MD  Nebulizer MISC Adult mask with tubing. Use as directed with nebulizer device. 11/18/18   Kozlow, Donnamarie Poag, MD  nitrofurantoin, macrocrystal-monohydrate, (MACROBID) 100 MG capsule  11/17/18   [provider]  ondansetron (ZOFRAN ODT) 4 MG disintegrating tablet Take 1 tablet (4 mg total) by mouth  every 8 (eight) hours as needed for nausea. Patient taking differently: Take 8 mg by mouth every 8 (eight) hours as needed for nausea.  06/07/18   Larene Pickett, PA-C  PERFOROMIST 20 MCG/2ML nebulizer solution USE 2 MLS BY NEBULIZATION TWICE DAILY 12/11/18   Kozlow, Donnamarie Poag, MD  polyethylene glycol powder (GLYCOLAX/MIRALAX) 17 GM/SCOOP powder Dissolve 17 grams (1 capful) in at least 8 ounces of water/juice and drink twice daily (MUST HAVE OFFICE VISIT FOR FURTHER REFILLS 11/13/18   Pyrtle, Lajuan Lines, MD  Revefenacin (YUPELRI) 175 MCG/3ML SOLN Inhale 1 vial into the lungs daily. 11/10/18   Kozlow, Donnamarie Poag, MD  senna (SENOKOT) 8.6 MG tablet Take 1 tablet by mouth at bedtime.     [provider]  traZODone (DESYREL) 50 MG tablet Take 2 tablets (100 mg total) by mouth at bedtime. 09/07/18   Rockwell Germany, NP    Family History Family History  Problem Relation Age of Onset  .  Seizures Mother   . Seizures Sister        1 sister has  . Pancreatic cancer Sister   . Colon cancer Neg Hx   . Colon polyps Neg Hx   . Esophageal cancer Neg Hx   . Rectal cancer Neg Hx   . Stomach cancer Neg Hx     Social History Social History   Tobacco Use  . Smoking status: Never Smoker  . Smokeless tobacco: Never Used  Substance Use Topics  . Alcohol use: No    Alcohol/week: 0.0 standard drinks  . Drug use: No     Allergies   Abilify [aripiprazole], Seroquel [quetiapine fumarate], Bactrim [sulfamethoxazole-trimethoprim], Doxycycline, Amoxicillin-pot clavulanate, and Keppra [levetiracetam]   Review of Systems Review of Systems  Constitutional: Negative for chills and fever.  HENT: Negative for sore throat and tinnitus.   Eyes: Negative for redness.  Respiratory: Negative for cough and shortness of breath.   Cardiovascular: Negative for chest pain and palpitations.  Gastrointestinal: Negative for abdominal pain, diarrhea, nausea and vomiting.  Genitourinary: Positive for frequency. Negative for  dysuria and urgency.  Musculoskeletal: Negative for myalgias.  Skin: Negative for rash.       No lesions  Neurological: Negative for weakness.  Hematological: Does not bruise/bleed easily.  Psychiatric/Behavioral: Negative for suicidal ideas.     Physical Exam Triage Vital Signs ED Triage Vitals  Enc Vitals Group     BP 05/20/18 1818 113/68     Pulse Rate 05/20/18 1818 (!) 110     Resp 05/20/18 1818 16     Temp 05/20/18 1818 98.5 F (36.9 C)     Temp Source 05/20/18 1818 Temporal     SpO2 05/20/18 1818 99 %     Weight --      Height --      Head Circumference --      Peak Flow --      Pain Score 05/20/18 1925 0     Pain Loc --      Pain Edu? --      Excl. in GC? --    No data found.  Updated Vital Signs BP 113/68 (BP Location: Right Arm)   Pulse (!) 110   Temp 98.5 F (36.9 C) (Temporal)   Resp 16   SpO2 99%   Visual Acuity Right Eye Distance:   Left Eye Distance:   Bilateral Distance:    Right Eye Near:   Left Eye Near:    Bilateral Near:     Physical Exam Vitals signs and nursing note reviewed.  Constitutional:      General: She is not in acute distress.    Appearance: She is well-developed.  HENT:     Head: Normocephalic and atraumatic.  Eyes:     General: No scleral icterus.    Conjunctiva/sclera: Conjunctivae normal.     Pupils: Pupils are equal, round, and reactive to light.  Neck:     Musculoskeletal: Normal range of motion and neck supple.     Thyroid: No thyromegaly.     Vascular: No JVD.     Trachea: No tracheal deviation.  Cardiovascular:     Rate and Rhythm: Normal rate and regular rhythm.     Heart sounds: Normal heart sounds. No murmur. No friction rub. No gallop.   Pulmonary:     Effort: Pulmonary effort is normal.     Breath sounds: Normal breath sounds.  Abdominal:     General: Bowel sounds are normal. There is  no distension.     Palpations: Abdomen is soft.     Tenderness: There is no abdominal tenderness.  Musculoskeletal:  Normal range of motion.  Lymphadenopathy:     Cervical: No cervical adenopathy.  Skin:    General: Skin is warm and dry.  Neurological:     Mental Status: She is alert and oriented to person, place, and time.     Cranial Nerves: No cranial nerve deficit.  Psychiatric:        Behavior: Behavior normal.        Thought Content: Thought content normal.        Judgment: Judgment normal.      UC Treatments / Results  Labs (all labs ordered are listed, but only abnormal results are displayed) Labs Reviewed  POCT URINALYSIS DIP (DEVICE) - Abnormal; Notable for the following components:      Result Value   Glucose, UA 100 (*)    Bilirubin Urine SMALL (*)    Ketones, ur TRACE (*)    Protein, ur 30 (*)    Urobilinogen, UA 4.0 (*)    All other components within normal limits    EKG   Radiology No results found.  Procedures Procedures (including critical care time)  Medications Ordered in UC Medications  cefTRIAXone (ROCEPHIN) injection 1 g (1 g Intramuscular Given 05/20/18 1855)  methylPREDNISolone sodium succinate (SOLU-MEDROL) 125 mg/2 mL injection 80 mg (80 mg Intramuscular Given 05/20/18 1855)  ondansetron (ZOFRAN-ODT) disintegrating tablet 4 mg (4 mg Oral Given 05/20/18 1856)  methylPREDNISolone sodium succinate (SOLU-MEDROL) 125 mg/2 mL injection (has no administration in time range)  ondansetron (ZOFRAN-ODT) 4 MG disintegrating tablet (has no administration in time range)  cefTRIAXone (ROCEPHIN) 1 g injection (has no administration in time range)    Initial Impression / Assessment and Plan / UC Course  I have reviewed the triage vital signs and the nursing notes.  Pertinent labs & imaging results that were available during my care of the patient were reviewed by me and considered in my medical decision making (see chart for details).     No indication of UTI, but urine is very concentrated with trace ketones indicating dehydration. Also, the patient is spilling glucose  into her urine indicating onset of diabetes. Emphasized drinking plenty of fluids and to f/u with her PMD and/or endocrine ASAP.   During the course of discussion it became apparent that the patient was suffering from symptoms of sinusitis (which she did not endorse up front, but then admitted to later). Rx abx and prednisone.  Explained that the latter would make her blood sugar worse.   Final Clinical Impressions(s) / UC Diagnoses   Final diagnoses:  Subacute ethmoidal sinusitis     Discharge Instructions     FINISH ALL OF YOUR ANTIBIOTICS   ED Prescriptions    Medication Sig Dispense Auth. Provider   amoxicillin-clavulanate (AUGMENTIN) 875-125 MG tablet Take 1 tablet by mouth every 12 (twelve) hours. 14 tablet Arnaldo Nataliamond, Shametra Cumberland S, MD   predniSONE (DELTASONE) 20 MG tablet Take 2 tablets (40 mg total) by mouth daily. 8 tablet Arnaldo Nataliamond, Mattson Dayal S, MD     PDMP not reviewed this encounter.   Arnaldo Nataliamond, Devon Pretty S, MD 12/23/18 2013

## 2018-12-31 DIAGNOSIS — N390 Urinary tract infection, site not specified: Secondary | ICD-10-CM | POA: Diagnosis not present

## 2019-01-11 DIAGNOSIS — R296 Repeated falls: Secondary | ICD-10-CM | POA: Diagnosis not present

## 2019-01-11 DIAGNOSIS — F819 Developmental disorder of scholastic skills, unspecified: Secondary | ICD-10-CM | POA: Diagnosis not present

## 2019-01-12 DIAGNOSIS — N390 Urinary tract infection, site not specified: Secondary | ICD-10-CM | POA: Diagnosis not present

## 2019-01-13 ENCOUNTER — Telehealth: Payer: Self-pay | Admitting: Allergy and Immunology

## 2019-01-14 DIAGNOSIS — R569 Unspecified convulsions: Secondary | ICD-10-CM | POA: Diagnosis not present

## 2019-01-14 DIAGNOSIS — Z049 Encounter for examination and observation for unspecified reason: Secondary | ICD-10-CM | POA: Diagnosis not present

## 2019-01-19 DIAGNOSIS — J4520 Mild intermittent asthma, uncomplicated: Secondary | ICD-10-CM | POA: Diagnosis not present

## 2019-01-21 ENCOUNTER — Telehealth (INDEPENDENT_AMBULATORY_CARE_PROVIDER_SITE_OTHER): Payer: Self-pay | Admitting: Family

## 2019-01-21 ENCOUNTER — Encounter: Payer: Self-pay | Admitting: Internal Medicine

## 2019-01-21 ENCOUNTER — Telehealth: Payer: Self-pay | Admitting: Adult Health

## 2019-01-21 ENCOUNTER — Ambulatory Visit (INDEPENDENT_AMBULATORY_CARE_PROVIDER_SITE_OTHER): Payer: Medicare Other | Admitting: Internal Medicine

## 2019-01-21 ENCOUNTER — Other Ambulatory Visit: Payer: Self-pay

## 2019-01-21 VITALS — Ht <= 58 in | Wt 185.0 lb

## 2019-01-21 DIAGNOSIS — Z79899 Other long term (current) drug therapy: Secondary | ICD-10-CM

## 2019-01-21 DIAGNOSIS — R110 Nausea: Secondary | ICD-10-CM

## 2019-01-21 DIAGNOSIS — G40209 Localization-related (focal) (partial) symptomatic epilepsy and epileptic syndromes with complex partial seizures, not intractable, without status epilepticus: Secondary | ICD-10-CM

## 2019-01-21 DIAGNOSIS — K219 Gastro-esophageal reflux disease without esophagitis: Secondary | ICD-10-CM

## 2019-01-21 DIAGNOSIS — K5909 Other constipation: Secondary | ICD-10-CM

## 2019-01-21 DIAGNOSIS — G40309 Generalized idiopathic epilepsy and epileptic syndromes, not intractable, without status epilepticus: Secondary | ICD-10-CM

## 2019-01-21 DIAGNOSIS — R569 Unspecified convulsions: Secondary | ICD-10-CM | POA: Diagnosis not present

## 2019-01-21 MED ORDER — FAMOTIDINE 20 MG PO TABS: 20.0000 mg | ORAL_TABLET | Freq: Two times a day (BID) | ORAL | 3 refills | Status: DC

## 2019-01-21 NOTE — Progress Notes (Signed)
Subjective:    Patient ID: Loel Lofty, female    DOB: June 19, 1995, 23 y.o.   MRN: [id number]  This service was provided via telemedicine.  Doximity application with A/V communication The patient was located at home The provider was located in provider's GI office. The patient did consent to this telephone visit and is aware of possible charges through their insurance for this visit.   The persons participating in this telemedicine service were the patient's mother, the patient and I. Time spent on call: 15 min   HPI Jeni Duling is a 23 year old female with a history of GERD, chronic constipation, seizure disorder, autism, obesity, interstitial cystitis, frequent UTI, sleep apnea who is seen for follow-up.  She is seen virtually today in the setting of COVID-19.  The patient's mother provides the majority of the history for this encounter.  She has been dealing with UTI of late and has been on antibiotics.  She is having urodynamic testing.  She also is being set up with CPAP for sleep apnea.  From a GI perspective she has been doing well with famotidine 20 mg twice a day.  This is controlling her reflux symptoms.  She does occasionally have some nocturnal coughing.  Her constipation is well controlled with MiraLAX 17 g twice a day and intermittent senna.  She usually takes senna once per day as well.  No blood in her stool or melena.  No trouble with abdominal pain.   Review of Systems As per HPI, otherwise negative  Current Medications, Allergies, Past Medical History, Past Surgical History, Family History and Social History were reviewed in [employer] record.     Objective:   Physical Exam Ht 4\' 9"  (1.448 m)   Wt 185 lb (83.9 kg)   BMI 40.03 kg/m  No PE today, virtual visit  CBC    Component Value Date/Time   WBC 12.0 (H) 09/10/2018 0003   RBC 3.79 (L) 09/10/2018 0003   HGB 11.8 (L) 09/10/2018 0003   HCT 36.1 09/10/2018 0003   PLT 254  09/10/2018 0003   MCV 95.3 09/10/2018 0003   MCH 31.1 09/10/2018 0003   MCHC 32.7 09/10/2018 0003   RDW 13.4 09/10/2018 0003   LYMPHSABS 5.2 (H) 09/10/2018 0003   MONOABS 1.3 (H) 09/10/2018 0003   EOSABS 0.0 09/10/2018 0003   BASOSABS 0.0 09/10/2018 0003   CMP     Component Value Date/Time   NA 139 09/10/2018 0003   K 3.8 09/10/2018 0003   CL 107 09/10/2018 0003   CO2 24 09/10/2018 0003   GLUCOSE 103 (H) 09/10/2018 0003   BUN 14 09/10/2018 0003   CREATININE 0.74 09/10/2018 0003   CALCIUM 8.8 (L) 09/10/2018 0003   PROT 6.4 (L) 09/10/2018 0003   PROT 6.6 08/20/2016 1126   ALBUMIN 3.1 (L) 09/10/2018 0003   AST 15 09/10/2018 0003   ALT 14 09/10/2018 0003   ALKPHOS 37 (L) 09/10/2018 0003   BILITOT 0.2 (L) 09/10/2018 0003   GFRNONAA >60 09/10/2018 0003   GFRAA >60 09/10/2018 0003       Assessment & Plan:  23 year old female with a history of GERD, chronic constipation, seizure disorder, autism, obesity, interstitial cystitis, frequent UTI, sleep apnea who is seen for follow-up.  1.  GERD with chronic cough --symptoms well controlled with famotidine 20 mg twice daily --Continue famotidine 20 mg twice daily  2.  Constipation, chronic --well-controlled MiraLAX and senna --Continue MiraLAX 17 g twice daily --Continue senna 1  tablet daily as needed  3.  Nausea --occasional and relieved with Zofran.  Continue Zofran 4 mg every 8 hours as needed  4.  Sleep apnea --still waiting to get CPAP machine which should be available in the next several weeks.  1 to 2-year follow-up, sooner if needed

## 2019-01-21 NOTE — Telephone Encounter (Signed)
Noted  

## 2019-01-21 NOTE — Telephone Encounter (Signed)
Mom [name] Cuddeback called to report that Lori Miles has another urinary tract infection and has been having a few seizures. Mom also notes that she missed one dose of medication earlier this week. I talked with Mom about the seizures and recommended that we check a Depakote level when it is convenient to do. Mom agreed with this plan and will take her to [facility] next week. TG

## 2019-01-21 NOTE — Addendum Note (Signed)
Addended by: Larina Bras on: 01/21/2019 04:26 PM   Modules accepted: Orders

## 2019-01-21 NOTE — Patient Instructions (Signed)
Continue famotidine 20 mg twice daily. We have sent refills to your pharmacy.  Continue MiraLAX 17 g twice daily.  Continue senna 1 tablet daily as needed.  Continue Zofran 4 mg every 8 hours as needed.  Please follow up with Dr Hilarie Fredrickson in 1 to 2-years; sooner if needed.  If you are age 24 or older, your body mass index should be between 23-30. Your Body mass index is 40.03 kg/m. If this is out of the aforementioned range listed, please consider follow up with your Primary Care Provider.  If you are age 7 or younger, your body mass index should be between 19-25. Your Body mass index is 40.03 kg/m. If this is out of the aformentioned range listed, please consider follow up with your Primary Care Provider.

## 2019-01-28 ENCOUNTER — Other Ambulatory Visit: Payer: Self-pay

## 2019-01-28 ENCOUNTER — Ambulatory Visit (INDEPENDENT_AMBULATORY_CARE_PROVIDER_SITE_OTHER): Payer: Medicare Other | Admitting: Certified Nurse Midwife

## 2019-01-28 ENCOUNTER — Encounter: Payer: Self-pay | Admitting: Certified Nurse Midwife

## 2019-01-28 VITALS — BP 120/88 | HR 120 | Ht <= 58 in | Wt 190.6 lb

## 2019-01-28 DIAGNOSIS — Z3041 Encounter for surveillance of contraceptive pills: Secondary | ICD-10-CM

## 2019-01-28 DIAGNOSIS — Z01419 Encounter for gynecological examination (general) (routine) without abnormal findings: Secondary | ICD-10-CM

## 2019-01-28 DIAGNOSIS — Z79899 Other long term (current) drug therapy: Secondary | ICD-10-CM | POA: Diagnosis not present

## 2019-01-28 MED ORDER — TERCONAZOLE 0.4 % VA CREA: 1.0000 | TOPICAL_CREAM | Freq: Every day | VAGINAL | 2 refills | Status: DC

## 2019-01-28 MED ORDER — LEVONORGEST-ETH ESTRAD 91-DAY 0.15-0.03 &0.01 MG PO TABS: 1.0000 | ORAL_TABLET | Freq: Every day | ORAL | 3 refills | Status: DC

## 2019-01-28 NOTE — Progress Notes (Signed)
GYNECOLOGY ANNUAL PREVENTATIVE CARE ENCOUNTER NOTE  History:     Lori Miles is a 23 y.o. No obstetric history on file. female here for a routine annual gynecologic exam.  Current complaints: none.   Denies abnormal vaginal bleeding, discharge, pelvic pain, problems with intercourse or other gynecologic concerns. Mother at bedside as legal guardian.    Gynecologic History No LMP recorded (lmp unknown). (Menstrual status: Oral contraceptives). Contraception: OCP (estrogen/progesterone) Last Pap: 02/2016. Results were: normal with negative HPV  Obstetric History OB History  No obstetric history on file.    Past Medical History:  Diagnosis Date  . Asthma   . Autism    mom states no actual dx, but admits she has some symptoms  . Bacteriuria   . Chronic constipation   . Cognitive developmental delay   . Disruptive behavior disorder   . Family history of adverse reaction to anesthesia    mother had itching after anesthesia one time  . Frequency-urgency syndrome   . Gait disorder    ORGANIC PER NERUOLGIST NOTE (DR HICKLING)  . Generalized convulsive epilepsy ([place]) NEUROLOGIST-  DR HICKLING  . GERD (gastroesophageal reflux disease)   . Gout   . H/O sinus tachycardia   . History of acute respiratory failure 02/14/2015   SECONDARY TO CAP  . History of recurrent UTIs   . Interstitial cystitis   . Moderate intellectual disability   . OSA (obstructive sleep apnea)    no machine yet   . Partial epilepsy with impairment of consciousness ([place])    FOLLOWED BY DR HICKLING  . Pneumonia 2018 last time  . PONV (postoperative nausea and vomiting)   . Seizure ([place])    most recent sz " at the end of summer"-last sz years ago per mother  . Urinary incontinence     Past Surgical History:  Procedure Laterality Date  . CYSTOSCOPY N/A 06/03/2017   Procedure: CYSTOSCOPY WITH EXAM UNDER ANESTHESIA;  Surgeon: Festus Aloe, MD;  Location: [facility];  Service:  Urology;  Laterality: N/A;  . CYSTOSCOPY  2020  . CYSTOSCOPY WITH HYDRODISTENSION AND BIOPSY  04/14/2012   Procedure: CYSTOSCOPY/BIOPSY/HYDRODISTENSION;  Surgeon: Hanley Ben, MD;  Location: [place];  Service: Urology;;  instillation of marcaine and pyridium  . EUA/  PAP SMEAR/  BREAST EXAM  03-12-2016   dr Ihor Dow [facility]  . EUA/ VAGINOSCOPY/ COLPOSCOPY FO THE HYMENAL RING  10-04-2009    dr Delsa Sale  [facility]  . TONSILLECTOMY      Current Outpatient Medications on File Prior to Visit  Medication Sig Dispense Refill  . cephALEXin (KEFLEX) 250 MG capsule Take by mouth.    Marland Kitchen acetaminophen (TYLENOL) 325 MG tablet Take 2 tablets (650 mg total) by mouth every 6 (six) hours as needed for mild pain or moderate pain. 60 tablet 0  . albuterol (PROVENTIL) (2.5 MG/3ML) 0.083% nebulizer solution INHALE CONTENTS OF 1 VIAL IN NEBULIZER EVERY 4 TO 6 HOURS IF NEEDED FOR COUGH OR WHEEZING (Patient taking differently: Take 2.5 mg by nebulization every 4 (four) hours as needed for wheezing or shortness of breath. ) 150 mL 5  . benzonatate (TESSALON) 200 MG capsule TAKE 1 CAPSULE(200 MG) BY MOUTH THREE TIMES DAILY AS NEEDED (Patient taking differently: Take 200 mg by mouth 3 (three) times daily as needed for cough. ) 60 capsule 0  . budesonide (PULMICORT) 1 MG/2ML nebulizer solution Inhale 1 vial 4 times per day, during upper respiratory infections and asthma flares for  1-2 weeks 240 mL 5  . cefUROXime (CEFTIN) 500 MG tablet SMARTSIG:1 Tablet(s) By Mouth Every 12 Hours    . cetirizine (ZYRTEC) 10 MG tablet TAKE 1 TABLET(10 MG) BY MOUTH DAILY 30 tablet 5  . clotrimazole-betamethasone (LOTRISONE) cream Apply to affected area 2 times daily prn (Patient taking differently: Apply 1 application topically daily. ) 45 g 0  . DEPAKOTE 500 MG DR tablet TAKE 1 TABLET BY MOUTH IN THE MORNING AND 2 AT NIGHT (Patient taking differently: Take 500-1,000 mg by mouth See admin instructions. Take 500 mg by mouth  in the morning and 1000 mg by mouth at bedtime) 90 tablet 5  . famotidine (PEPCID) 20 MG tablet Take 1 tablet (20 mg total) by mouth every 12 (twelve) hours. 180 tablet 3  . hydrOXYzine (ATARAX/VISTARIL) 25 MG tablet Take 25 mg by mouth 3 (three) times daily.    Marland Kitchen. levofloxacin (LEVAQUIN) 750 MG tablet Take 750 mg by mouth daily.    . Melatonin 3 MG TABS Take 3 mg by mouth at bedtime.     . montelukast (SINGULAIR) 10 MG tablet TAKE 1 TABLET(10 MG) BY MOUTH AT BEDTIME 30 tablet 5  . Nebulizer MISC Adult mask with tubing. Use as directed with nebulizer device. 4 each 12  . ondansetron (ZOFRAN ODT) 4 MG disintegrating tablet Take 1 tablet (4 mg total) by mouth every 8 (eight) hours as needed for nausea. (Patient taking differently: Take 8 mg by mouth every 8 (eight) hours as needed for nausea. ) 10 tablet 0  . PERFOROMIST 20 MCG/2ML nebulizer solution USE 2 MLS BY NEBULIZATION TWICE DAILY 120 mL 5  . polyethylene glycol powder (GLYCOLAX/MIRALAX) 17 GM/SCOOP powder Dissolve 17 grams (1 capful) in at least 8 ounces of water/juice and drink twice daily (MUST HAVE OFFICE VISIT FOR FURTHER REFILLS 1020 g 0  . Revefenacin (YUPELRI) 175 MCG/3ML SOLN Inhale 1 vial into the lungs daily. 270 mL 2  . senna (SENOKOT) 8.6 MG tablet Take 1 tablet by mouth at bedtime.     . traZODone (DESYREL) 50 MG tablet Take 2 tablets (100 mg total) by mouth at bedtime. 62 tablet 5   No current facility-administered medications on file prior to visit.     Allergies  Allergen Reactions  . Abilify [Aripiprazole] Swelling and Palpitations  . Seroquel [Quetiapine Fumarate] Palpitations  . Bactrim [Sulfamethoxazole-Trimethoprim] Other (See Comments)    Abdominal pain  . Doxycycline Other (See Comments)    Stomach cramps  . Macrobid [Nitrofurantoin Macrocrystal]   . Amoxicillin-Pot Clavulanate Hives    Has patient had a PCN reaction causing immediate rash, facial/tongue/throat swelling, SOB or lightheadedness with hypotension:  No Has patient had a PCN reaction causing severe rash involving mucus membranes or skin necrosis: Yes Has patient had a PCN reaction that required hospitalization: No Has patient had a PCN reaction occurring within the last 10 years: No If all of the above answers are "NO", then may proceed with Cephalosporin use.   Marland Kitchen. Keppra [Levetiracetam] Other (See Comments)    Aggressive behavior     Social History:  reports that she has never smoked. She has never used smokeless tobacco. She reports that she does not drink alcohol or use drugs.  Family History  Problem Relation Age of Onset  . Seizures Mother   . Seizures Sister        1 sister has  . Pancreatic cancer Sister   . Colon cancer Neg Hx   . Colon polyps Neg Hx   .  Esophageal cancer Neg Hx   . Rectal cancer Neg Hx   . Stomach cancer Neg Hx     The following portions of the patient's history were reviewed and updated as appropriate: allergies, current medications, past family history, past medical history, past social history, past surgical history and problem list.  Review of Systems Pertinent items noted in HPI and remainder of comprehensive ROS otherwise negative.  Physical Exam:  BP 120/88   Pulse (!) 120   Ht 4\' 9"  (1.448 m)   Wt 190 lb 9.6 oz (86.5 kg)   LMP  (LMP Unknown)   BMI 41.25 kg/m  CONSTITUTIONAL:nfemale in no acute distress.  HENT:  Normocephalic, atraumatic, External right and left ear normal. Oropharynx is clear and moist EYES: Conjunctivae and EOM are normal. Pupils are equal, round, and reactive to light.   SKIN: Skin is warm and dry. No rash noted. Not diaphoretic. No erythema. No pallor. MUSCULOSKELETAL: Normal range of motion. No tenderness.  No cyanosis, clubbing, or edema.  2+ distal pulses. NEUROLOGIC:  Normal reflexes, muscle tone coordination.  PSYCHIATRIC: Normal mood and affect. Normal behavior. Normal judgment and thought content. CARDIOVASCULAR: Normal heart rate noted, regular rhythm  RESPIRATORY: Clear to auscultation bilaterally. Effort and breath sounds normal, no problems with respiration noted. ABDOMEN: Soft, no distention noted.  No tenderness, rebound or guarding.    Assessment and Plan:    1. Women's annual routine gynecological examination - Normal examination, pap smear due in December, patient needs to be sedated for pelvic examination  - Mother request refill for terazol vaginal cream, d/t frequent yeast infections because of patient needing depends daily, Rx sent to pharmacy  - terconazole (TERAZOL 7) 0.4 % vaginal cream; Place 1 applicator vaginally at bedtime.  Dispense: 45 g; Refill: 2  2. Encounter for birth control pills maintenance - No complaints on birth control pill, will like to continue on same pills  - Refill sent to pharmacy on file  - Levonorgestrel-Ethinyl Estradiol (SEASONIQUE) 0.15-0.03 &0.01 MG tablet; Take 1 tablet by mouth daily.  Dispense: 3 Package; Refill: 3  Will schedule pap smear under sedation- message sent to [phone] for scheduling.  Routine preventative health maintenance measures emphasized. Please refer to After Visit Summary for other counseling recommendations.      [place], CNM [facility], [facility]

## 2019-01-28 NOTE — Progress Notes (Addendum)
GYN presents for [employer]. Need refills.  Her PAP needs to scheduled at the Hospital, so she can be sedated.

## 2019-01-29 ENCOUNTER — Telehealth (INDEPENDENT_AMBULATORY_CARE_PROVIDER_SITE_OTHER): Payer: Self-pay | Admitting: Family

## 2019-01-29 LAB — VALPROIC ACID LEVEL: Valproic Acid Lvl: 81.7 mg/L (ref 50.0–100.0)

## 2019-01-29 NOTE — Telephone Encounter (Signed)
I called lab results to Mom. The Depakote level is therapeutic. Mom says that she has had a couple of seizures while she has been receiving antibiotics for a UTI but has otherwise been doing well. I recommended no change in the Depakote dose for now but ask Mom to call me if the seizures become more frequent or more severe. Mom agreed with this plan. TG

## 2019-02-01 ENCOUNTER — Other Ambulatory Visit: Payer: Self-pay | Admitting: Allergy and Immunology

## 2019-02-02 ENCOUNTER — Telehealth (INDEPENDENT_AMBULATORY_CARE_PROVIDER_SITE_OTHER): Payer: Self-pay | Admitting: Family

## 2019-02-02 NOTE — Telephone Encounter (Signed)
Mom contacted me to request a letter in support of a 3 bedroom apartment that would accommodate Ol[facility]'s equipment and supplies. I will write the letter and Mom will pick it up. TG

## 2019-02-03 ENCOUNTER — Telehealth: Payer: Self-pay | Admitting: Pulmonary Disease

## 2019-02-03 ENCOUNTER — Encounter (INDEPENDENT_AMBULATORY_CARE_PROVIDER_SITE_OTHER): Payer: Self-pay | Admitting: Family

## 2019-02-03 NOTE — Telephone Encounter (Signed)
Called back and she stated her daughter just received the CPAP machine yesterday and waiting on the mask. Was able to get assistance from Dr. Hilarie Fredrickson (she stated she was not sure of the name) to pay for the machine.  I told her that once her daughter has started using the machine to mark on the calendar 30 days from that date and call out office to set up an appointment for follow up after that date. She agreed and said they have a follow up appointment in January with Dr. Hilarie Fredrickson (?) to see if her daughter improves. Feels that the CPAP can help resolve other issues she is having with reflux and her seizures.  Nothing further needed at this time.

## 2019-02-09 ENCOUNTER — Ambulatory Visit: Payer: Medicare Other | Admitting: Family Medicine

## 2019-02-10 DIAGNOSIS — R569 Unspecified convulsions: Secondary | ICD-10-CM | POA: Diagnosis not present

## 2019-02-10 DIAGNOSIS — Z049 Encounter for examination and observation for unspecified reason: Secondary | ICD-10-CM | POA: Diagnosis not present

## 2019-02-15 ENCOUNTER — Other Ambulatory Visit: Payer: Self-pay | Admitting: Internal Medicine

## 2019-02-15 ENCOUNTER — Telehealth: Payer: Self-pay

## 2019-02-15 MED ORDER — POLYETHYLENE GLYCOL 3350 17 GM/SCOOP PO POWD: ORAL | 1 refills | Status: DC

## 2019-02-15 NOTE — Telephone Encounter (Signed)
[name] called stating that she had a question about the pt's surgery on 03/03/19.  Called pt and pt's mother wanted to know when she will hear about preadmission.  I explained to the pt that they usually call closer to her surgery date.  [name] stated that she needed to know because her husband needs to take off of work.  [name] also stated that she called the [facility] office and spoke with [facility].  I advised pt to wait to here back from [facility] since she is looking into it for her.  [name] verbalized understanding.

## 2019-02-15 NOTE — Telephone Encounter (Signed)
Called and spoke to pt's mother, [name]. They need a refill of Miralax.  Per last office note 01-21-19 pt is to continue Miralax bid. Refill sent.

## 2019-02-15 NOTE — Telephone Encounter (Signed)
[name] Sherril Croon called inquiring about rf for polyethylene, she stated that she needs to speak with a clinical person to clarify this prescription. Pls call her.

## 2019-02-17 DIAGNOSIS — R569 Unspecified convulsions: Secondary | ICD-10-CM | POA: Diagnosis not present

## 2019-02-22 DIAGNOSIS — J4520 Mild intermittent asthma, uncomplicated: Secondary | ICD-10-CM | POA: Diagnosis not present

## 2019-02-26 DIAGNOSIS — Z124 Encounter for screening for malignant neoplasm of cervix: Secondary | ICD-10-CM

## 2019-02-26 NOTE — H&P (Signed)
Lori Miles is an 23 y.o. No obstetric history on file. female.   Chief Complaint: needs pap smear HPI: Patient with autism and cognitive delay, for exam under anesthesia.  Past Medical History:  Diagnosis Date  . Asthma   . Autism    mom states no actual dx, but admits she has some symptoms  . Bacteriuria   . Chronic constipation   . Cognitive developmental delay   . Disruptive behavior disorder   . Family history of adverse reaction to anesthesia    mother had itching after anesthesia one time  . Frequency-urgency syndrome   . Gait disorder    ORGANIC PER NERUOLGIST NOTE (DR HICKLING)  . Generalized convulsive epilepsy ([place]) NEUROLOGIST-  DR HICKLING  . GERD (gastroesophageal reflux disease)   . Gout   . H/O sinus tachycardia   . History of acute respiratory failure 02/14/2015   SECONDARY TO CAP  . History of recurrent UTIs   . Interstitial cystitis   . Moderate intellectual disability   . OSA (obstructive sleep apnea)    no machine yet   . Partial epilepsy with impairment of consciousness ([place])    FOLLOWED BY DR HICKLING  . Pneumonia 2018 last time  . PONV (postoperative nausea and vomiting)   . Seizure ([place])    most recent sz " at the end of summer"-last sz years ago per mother  . Urinary incontinence     Past Surgical History:  Procedure Laterality Date  . CYSTOSCOPY N/A 06/03/2017   Procedure: CYSTOSCOPY WITH EXAM UNDER ANESTHESIA;  Surgeon: Festus Aloe, MD;  Location: [facility];  Service: Urology;  Laterality: N/A;  . CYSTOSCOPY  2020  . CYSTOSCOPY WITH HYDRODISTENSION AND BIOPSY  04/14/2012   Procedure: CYSTOSCOPY/BIOPSY/HYDRODISTENSION;  Surgeon: Hanley Ben, MD;  Location: [place];  Service: Urology;;  instillation of marcaine and pyridium  . EUA/  PAP SMEAR/  BREAST EXAM  03-12-2016   dr Ihor Dow [facility]  . EUA/ VAGINOSCOPY/ COLPOSCOPY FO THE HYMENAL RING  10-04-2009    dr Delsa Sale  [facility]  . TONSILLECTOMY       Family History  Problem Relation Age of Onset  . Seizures Mother   . Seizures Sister        1 sister has  . Pancreatic cancer Sister   . Colon cancer Neg Hx   . Colon polyps Neg Hx   . Esophageal cancer Neg Hx   . Rectal cancer Neg Hx   . Stomach cancer Neg Hx    Social History:  reports that she has never smoked. She has never used smokeless tobacco. She reports that she does not drink alcohol or use drugs.  Allergies:  Allergies  Allergen Reactions  . Abilify [Aripiprazole] Swelling and Palpitations  . Seroquel [Quetiapine Fumarate] Palpitations  . Bactrim [Sulfamethoxazole-Trimethoprim] Other (See Comments)    Abdominal pain  . Doxycycline Other (See Comments)    Stomach cramps  . Macrobid [Nitrofurantoin Macrocrystal] Other (See Comments)    Sharp pain/ given with Tylenol 325 mg  . Amoxicillin-Pot Clavulanate Hives    Has patient had a PCN reaction causing immediate rash, facial/tongue/throat swelling, SOB or lightheadedness with hypotension: No Has patient had a PCN reaction causing severe rash involving mucus membranes or skin necrosis: Yes Has patient had a PCN reaction that required hospitalization: No Has patient had a PCN reaction occurring within the last 10 years: No If all of the above answers are "NO", then may proceed with Cephalosporin use.   Marland Kitchen  Keppra [Levetiracetam] Other (See Comments)    Aggressive behavior     No medications prior to admission.    A comprehensive review of systems was negative.  There were no vitals taken for this visit. General appearance: alert, cooperative and appears stated age Neck: supple, symmetrical, trachea midline Lungs: normal effort Heart: regular rate and rhythm Abdomen: soft, non-tender; bowel sounds normal; no masses,  no organomegaly Skin: Skin color, texture, turgor normal. No rashes or lesions   Lab Results  Component Value Date   WBC 12.0 (H) 09/10/2018   HGB 11.8 (L) 09/10/2018   HCT 36.1 09/10/2018    MCV 95.3 09/10/2018   PLT 254 09/10/2018   Lab Results  Component Value Date   PREGTESTUR NEGATIVE 04/06/2018   HCG <5.0 09/10/2018     Assessment/Plan Active Problems:   Screening for cervical cancer  For exam under anesthesia and pap smear.    Likelihood of success is high.    Reva Bores 02/26/2019, 11:10 AM

## 2019-02-28 DIAGNOSIS — R300 Dysuria: Secondary | ICD-10-CM | POA: Diagnosis not present

## 2019-03-02 ENCOUNTER — Ambulatory Visit: Payer: Medicare Other | Admitting: Family Medicine

## 2019-03-02 ENCOUNTER — Other Ambulatory Visit: Payer: Self-pay

## 2019-03-02 ENCOUNTER — Encounter (HOSPITAL_COMMUNITY): Payer: Self-pay | Admitting: Family Medicine

## 2019-03-02 NOTE — Progress Notes (Signed)
Anesthesia Chart Review: SAME DAY WORK-UP   Case: [id number] Date/Time: 03/03/19 1045   Procedure: EXAM UNDER ANESTHESIA AND PAP SMEAR (N/A )   Anesthesia type: Choice   Pre-op diagnosis: Cognitive developmental delay   Location: [facility] OR ROOM 07 / [place] OR   Surgeons: Donnamae Jude, MD      DISCUSSION: Patient is a 23 year old female with cognitive developmental delay who is scheduled for the above procedure.  History includes never smoker, post-operative N/V, seizures, cognitive developmental delay, disruptive behavior disorder, autism, gait disorder, interstitial cystitis (with frequent UTI), GERD, asthma, chronic constipation, sinus tachycardia, dyspnea, snoring (evaluated for OSA).   Last seizure 02/27/19 and diagnosed with UTI (started on Macrobid and Keflex x 7 days 02/28/19).  Known seizure history with breakthrough seizures during times of infection.  (This was reported by mom to PAT RN and is mentioned in neurology documentation.)   Previous procedures include:  - Cystoscopy with Botox bladder injection 10/23/18 ([facility], [facility]) - Cystoscopy (MAC) 06/03/17 - She had vaginal exam/PAPD under MAC anesthesia 10/04/09 and 03/12/16  - Meatal dilation, Cystoscopy, bladder biopsy 04/14/12 ([facility])  Patient's mother is concerned that COVID-19 testing procedure may induce a seizure. Department assistant director Rolla Flatten, RN aware. May consider testing while under sedation.     PROVIDERS: Wyline Copas, MD is neurologist. Primarily sess Rockwell Germany, NP, last visit 05/26/18. Chesley Mires, MD is pulmonologist. Last visit 09/29/18 with Rexene Edison, NP. By notes, patient has had multiple sleep studies showing no significant sleep apnea and that 09/22/18 study showed "total sleep time at 367 minutes.  There was no significant sleep apnea with a AHI at 0.7.  PLM I was 0.  She had a M SLT done on September 23, 2018 with sleep onset and 5 of 5 naps with mean sleep latency at 29 seconds.  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 Mother was offered a second opinion.  - Allena Katz, MD is Allergiest - Zenovia Jarred, MD is GI - Hulen Luster, MD is pediatric urologist ([place]). Most recent visit was with urologist Alona Bene, MD 01/12/19. - She is not followed by cardiology regularly, but saw EP cardiologist Curt Bears, Will, MD on 05/14/15 for tachycardia and had echo and Holter monitor (see below).   LABS: Defer to anesthesiologist.   IMAGES/OTHER: DG ABD/Chest 09/10/18: FINDINGS: - Evaluation is limited due to body habitus. Hazy density over the right lung most likely soft tissue attenuation. No focal consolidation, pleural effusion, or pneumothorax. The cardiac silhouette is within normal limits. - No bowel dilatation or evidence of obstruction. No free air. The osseous structures are grossly unremarkable. IMPRESSION: Negative abdominal radiographs. No acute cardiopulmonary disease.  EEG 05/21/18: Impression: This is a normal record with the patient awake.  Normal EEG does not rule out the presence of seizures however the behaviors witnessed were nonepileptic in nature.    EKG: 05/21/17: Sinus rhythm Borderline T abnormalities, anterior leads Baseline wander in lead(s) II III aVF V3 Otherwise no significant change Confirmed by Addison Lank [phone]) on 05/21/2017 8:45:11 AM   CV: 24-48 Hour Holter Monitor 07/07/15: Minimum HR: 76 Maximum HR: 160 Mean HR: 100 Sinus rhythm with episodes of sinus tachycardia and sinus arrhythmia No evidence of SVT or significant bradycardia   Echo 05/29/15: Study Conclusions - Left ventricle: The cavity size was normal. Systolic function was  normal. The estimated ejection fraction was in the range of 55%   to 60%. Wall motion was normal; there were no regional wall   motion abnormalities. Left  ventricular diastolic function   parameters were normal. - Tricuspid valve: There was trivial regurgitation.   Past Medical History:  Diagnosis Date  . Asthma   . Autism    mom states no actual dx, but admits she has some symptoms  . Bacteriuria   . Chronic constipation   . Cognitive developmental delay   . Constipation   . Disruptive behavior disorder   . Dyspnea    if she walks much  . Family history of adverse reaction to anesthesia     "hives"-? if anesthesia did take antibiotic  . Frequency-urgency syndrome   . Gait disorder    ORGANIC PER NERUOLGIST NOTE (DR HICKLING)  . Generalized convulsive epilepsy ([place]) NEUROLOGIST-  DR HICKLING  . GERD (gastroesophageal reflux disease)   . Gout   . H/O sinus tachycardia   . History of acute respiratory failure 02/14/2015   SECONDARY TO CAP  . History of recurrent UTIs   . Interstitial cystitis   . Moderate intellectual disability   . OSA (obstructive sleep apnea)    no machine yet   . Partial epilepsy with impairment of consciousness ([place])    FOLLOWED BY DR HICKLING  . Pneumonia 2    4 times this year ( 2020)  . PONV (postoperative nausea and vomiting)   . Seizure ([place])    most recent sz " at the end of summer"-last sz years ago per mother  . Urinary incontinence     Past Surgical History:  Procedure Laterality Date  . CYSTOSCOPY N/A 06/03/2017   Procedure: CYSTOSCOPY WITH EXAM UNDER ANESTHESIA;  Surgeon: Festus Aloe, MD;  Location: [facility];  Service: Urology;  Laterality: N/A;  . CYSTOSCOPY  2020  . CYSTOSCOPY WITH HYDRODISTENSION AND BIOPSY  04/14/2012   Procedure: CYSTOSCOPY/BIOPSY/HYDRODISTENSION;  Surgeon: Hanley Ben, MD;  Location: [place];  Service: Urology;;  instillation of marcaine and pyridium  . EUA/  PAP SMEAR/  BREAST EXAM  03-12-2016   dr Ihor Dow [facility]  . EUA/ VAGINOSCOPY/ COLPOSCOPY FO THE HYMENAL RING  10-04-2009    dr Delsa Sale  [facility]  .  TONSILLECTOMY      MEDICATIONS: No current facility-administered medications for this encounter.   . cephALEXin (KEFLEX) 500 MG capsule  . fluconazole (DIFLUCAN) 150 MG tablet  . nitrofurantoin (MACRODANTIN) 100 MG capsule  . acetaminophen (TYLENOL) 325 MG tablet  . albuterol (PROVENTIL) (2.5 MG/3ML) 0.083% nebulizer solution  . benzonatate (TESSALON) 200 MG capsule  . budesonide (PULMICORT) 1 MG/2ML nebulizer solution  . cefUROXime (CEFTIN) 500 MG tablet  . cetirizine (ZYRTEC) 10 MG tablet  . clotrimazole-betamethasone (LOTRISONE) cream  . DEPAKOTE 500 MG DR tablet  . famotidine (PEPCID) 20 MG tablet  . hydrOXYzine (ATARAX/VISTARIL) 25 MG tablet  . levofloxacin (LEVAQUIN) 750 MG tablet  . Levonorgestrel-Ethinyl Estradiol (SEASONIQUE) 0.15-0.03 &0.01 MG tablet  . Melatonin 3 MG TABS  . montelukast (SINGULAIR) 10 MG tablet  . Nebulizer MISC  . ondansetron (ZOFRAN ODT) 4 MG disintegrating tablet  . PERFOROMIST 20 MCG/2ML nebulizer solution  . polyethylene glycol powder (GLYCOLAX/MIRALAX) 17 GM/SCOOP powder  . senna (SENOKOT) 8.6 MG tablet  . terconazole (TERAZOL 7) 0.4 % vaginal cream  . traZODone (DESYREL) 50 MG tablet  . YUPELRI 175 MCG/3ML SOLN    Myra Gianotti, PA-C Surgical Short Stay/Anesthesiology [facility] Phone [phone] [facility] Phone [phone] 03/02/2019 4:41 PM

## 2019-03-02 NOTE — Anesthesia Preprocedure Evaluation (Addendum)
Anesthesia Evaluation  Patient identified by MRN, date of birth, ID band Patient awake    Reviewed: Allergy & Precautions, NPO status , Patient's Chart, lab work & pertinent test results  History of Anesthesia Complications (+) PONV, Family history of anesthesia reaction and history of anesthetic complications  Airway Mallampati: II  TM Distance: >3 FB Neck ROM: Full    Dental  (+) Teeth Intact, Dental Advisory Given   Pulmonary asthma , sleep apnea , neg recent URI,    Pulmonary exam normal breath sounds clear to auscultation       Cardiovascular negative cardio ROS Normal cardiovascular exam Rhythm:Regular Rate:Normal     Neuro/Psych Seizures - (last seizure on Sunday), Well Controlled,  PSYCHIATRIC DISORDERS (Autism)    GI/Hepatic Neg liver ROS, GERD  Medicated,  Endo/Other  diabetes, Type 2Morbid obesity  Renal/GU negative Renal ROS Bladder dysfunction  IC UTI    Musculoskeletal negative musculoskeletal ROS (+)   Abdominal   Peds  (+) mental retardation Hematology negative hematology ROS (+)   Anesthesia Other Findings Day of surgery medications reviewed with the patient.  Reproductive/Obstetrics                            Anesthesia Physical Anesthesia Plan  ASA: III  Anesthesia Plan: MAC   Post-op Pain Management:    Induction: Intravenous  PONV Risk Score and Plan: 3 and Propofol infusion and Treatment may vary due to age or medical condition  Airway Management Planned: Nasal Cannula and Natural Airway  Additional Equipment:   Intra-op Plan:   Post-operative Plan:   Informed Consent: I have reviewed the patients History and Physical, chart, labs and discussed the procedure including the risks, benefits and alternatives for the proposed anesthesia with the patient or authorized representative who has indicated his/her understanding and acceptance.     Dental  advisory given  Plan Discussed with: CRNA and Anesthesiologist  Anesthesia Plan Comments: (See PAT note written 03/02/2019 by Myra Gianotti, PA-C including comments on preoperative COVID-19 testing.   Consent reviewed with POA)       Anesthesia Quick Evaluation

## 2019-03-02 NOTE — Progress Notes (Signed)
I spoke with Lori Miles, Lori Miles's mother. Lori Miles denies that patient nor anyone in the home has any s/s of Covid.  Lori Miles said patient cannot have Covid test, test  Possibly could cause a seizure. This has been approved by Lori Flatten, MSN, AD.  Lori Miles reports that [name] had a seizure Sunday involving "moving her head back and forth." Lori Miles also reported that patient was not eating or drinking. "This happens when she has a UTI".  Lori Miles was taken to Urgent an antibiotics were changed. Lori Miles has interstitial cystitis and has frequent UTI's . Patient's mother said that patient is much better, eating and drinking and no fever. Lori Miles does speak, difficult to understand.  Patient has almost fallen several times, mother reports that she hasn't fallen, "we are there to help her and now Lori Miles has a wheelchair that has a seat. Lori Miles ask=ed if patient nay take Miralax in am. I told her I was not sure, I would check with PA-C for anesthesia. I spoke with Lori Grams, PA- C , she will check and also review chart.  I instructed Lori Miles that Lori Miles is not to eat after midnight, but may have clear liquids until 0800.  Lori Miles drinks water or sprite, I told mother that either one is ok.

## 2019-03-03 ENCOUNTER — Encounter (HOSPITAL_COMMUNITY)
Admission: RE | Disposition: A | Discharge status: Home / Self Care | Payer: Self-pay | Source: Ambulatory Visit | Attending: Family Medicine

## 2019-03-03 ENCOUNTER — Ambulatory Visit (HOSPITAL_COMMUNITY): Payer: Medicare Other | Admitting: Certified Registered"

## 2019-03-03 ENCOUNTER — Other Ambulatory Visit: Payer: Self-pay

## 2019-03-03 ENCOUNTER — Ambulatory Visit (HOSPITAL_COMMUNITY)
Admission: RE | Admit: 2019-03-03 | Discharge: 2019-03-03 | Disposition: A | Discharge status: Home / Self Care | Payer: Medicare Other | Source: Ambulatory Visit | Attending: Family Medicine | Admitting: Family Medicine

## 2019-03-03 ENCOUNTER — Encounter (HOSPITAL_COMMUNITY): Payer: Self-pay | Admitting: Family Medicine

## 2019-03-03 DIAGNOSIS — G40409 Other generalized epilepsy and epileptic syndromes, not intractable, without status epilepticus: Secondary | ICD-10-CM | POA: Diagnosis not present

## 2019-03-03 DIAGNOSIS — F840 Autistic disorder: Secondary | ICD-10-CM | POA: Diagnosis not present

## 2019-03-03 DIAGNOSIS — Z79899 Other long term (current) drug therapy: Secondary | ICD-10-CM | POA: Insufficient documentation

## 2019-03-03 DIAGNOSIS — Z881 Allergy status to other antibiotic agents status: Secondary | ICD-10-CM | POA: Diagnosis not present

## 2019-03-03 DIAGNOSIS — Z886 Allergy status to analgesic agent status: Secondary | ICD-10-CM | POA: Insufficient documentation

## 2019-03-03 DIAGNOSIS — G4733 Obstructive sleep apnea (adult) (pediatric): Secondary | ICD-10-CM | POA: Diagnosis not present

## 2019-03-03 DIAGNOSIS — R3982 Chronic bladder pain: Secondary | ICD-10-CM | POA: Insufficient documentation

## 2019-03-03 DIAGNOSIS — K219 Gastro-esophageal reflux disease without esophagitis: Secondary | ICD-10-CM | POA: Insufficient documentation

## 2019-03-03 DIAGNOSIS — K5909 Other constipation: Secondary | ICD-10-CM | POA: Insufficient documentation

## 2019-03-03 DIAGNOSIS — Z880 Allergy status to penicillin: Secondary | ICD-10-CM | POA: Insufficient documentation

## 2019-03-03 DIAGNOSIS — Z793 Long term (current) use of hormonal contraceptives: Secondary | ICD-10-CM | POA: Diagnosis not present

## 2019-03-03 DIAGNOSIS — Z01419 Encounter for gynecological examination (general) (routine) without abnormal findings: Secondary | ICD-10-CM | POA: Diagnosis present

## 2019-03-03 DIAGNOSIS — Z124 Encounter for screening for malignant neoplasm of cervix: Secondary | ICD-10-CM

## 2019-03-03 DIAGNOSIS — J45909 Unspecified asthma, uncomplicated: Secondary | ICD-10-CM | POA: Insufficient documentation

## 2019-03-03 DIAGNOSIS — Z7951 Long term (current) use of inhaled steroids: Secondary | ICD-10-CM | POA: Diagnosis not present

## 2019-03-03 DIAGNOSIS — R6250 Unspecified lack of expected normal physiological development in childhood: Secondary | ICD-10-CM | POA: Diagnosis not present

## 2019-03-03 HISTORY — DX: Constipation, unspecified: K59.00

## 2019-03-03 HISTORY — DX: Dyspnea, unspecified: R06.00

## 2019-03-03 LAB — CBC
HCT: 37.5 % (ref 36.0–46.0)
Hemoglobin: 12.6 g/dL (ref 12.0–15.0)
MCH: 30.2 pg (ref 26.0–34.0)
MCHC: 33.6 g/dL (ref 30.0–36.0)
MCV: 89.9 fL (ref 80.0–100.0)
Platelets: 254 10*3/uL (ref 150–400)
RBC: 4.17 MIL/uL (ref 3.87–5.11)
RDW: 13.6 % (ref 11.5–15.5)
WBC: 9.1 10*3/uL (ref 4.0–10.5)
nRBC: 0 % (ref 0.0–0.2)

## 2019-03-03 LAB — BASIC METABOLIC PANEL
Anion gap: 12 (ref 5–15)
BUN: 9 mg/dL (ref 6–20)
CO2: 22 mmol/L (ref 22–32)
Calcium: 9 mg/dL (ref 8.9–10.3)
Chloride: 102 mmol/L (ref 98–111)
Creatinine, Ser: 0.87 mg/dL (ref 0.44–1.00)
GFR calc Af Amer: >60 mL/min (ref >60)
GFR calc non Af Amer: >60 mL/min (ref >60)
Glucose, Bld: 81 mg/dL (ref 70–99)
Potassium: 3.9 mmol/L (ref 3.5–5.1)
Sodium: 136 mmol/L (ref 135–145)

## 2019-03-03 LAB — POCT PREGNANCY, URINE: Preg Test, Ur: NEGATIVE

## 2019-03-03 SURGERY — EXAM UNDER ANESTHESIA
Anesthesia: Monitor Anesthesia Care | Site: Vagina

## 2019-03-03 MED ORDER — ONDANSETRON HCL 4 MG/2ML IJ SOLN
INTRAMUSCULAR | Status: DC | PRN
  Administered 2019-03-03: 4 mg via INTRAVENOUS

## 2019-03-03 MED ORDER — PROMETHAZINE HCL 25 MG/ML IJ SOLN: 6.2500 mg | INTRAMUSCULAR | Status: DC | PRN

## 2019-03-03 MED ORDER — LIDOCAINE 2% (20 MG/ML) 5 ML SYRINGE
INTRAMUSCULAR | Status: AC
  Filled 2019-03-03: qty 5

## 2019-03-03 MED ORDER — LACTATED RINGERS IV SOLN: INTRAVENOUS | Status: DC

## 2019-03-03 MED ORDER — LACTATED RINGERS IV SOLN: INTRAVENOUS | Status: DC | PRN

## 2019-03-03 MED ORDER — MIDAZOLAM HCL 5 MG/5ML IJ SOLN
INTRAMUSCULAR | Status: DC | PRN
  Administered 2019-03-03: 2 mg via INTRAVENOUS

## 2019-03-03 MED ORDER — PROPOFOL 10 MG/ML IV BOLUS
INTRAVENOUS | Status: DC | PRN
  Administered 2019-03-03: 30 mg via INTRAVENOUS

## 2019-03-03 MED ORDER — MIDAZOLAM HCL 2 MG/2ML IJ SOLN
INTRAMUSCULAR | Status: AC
  Filled 2019-03-03: qty 2

## 2019-03-03 MED ORDER — PROPOFOL 500 MG/50ML IV EMUL
INTRAVENOUS | Status: DC | PRN
  Administered 2019-03-03: 25 ug/kg/min via INTRAVENOUS

## 2019-03-03 MED ORDER — ONDANSETRON HCL 4 MG/2ML IJ SOLN
INTRAMUSCULAR | Status: AC
  Filled 2019-03-03: qty 2

## 2019-03-03 MED ORDER — LIDOCAINE 2% (20 MG/ML) 5 ML SYRINGE
INTRAMUSCULAR | Status: DC | PRN
  Administered 2019-03-03: 60 mg via INTRAVENOUS

## 2019-03-03 MED ORDER — FENTANYL CITRATE (PF) 250 MCG/5ML IJ SOLN
INTRAMUSCULAR | Status: AC
  Filled 2019-03-03: qty 5

## 2019-03-03 SURGICAL SUPPLY — 7 items
CATH ROBINSON RED A/P 16FR (CATHETERS) IMPLANT
COVER BACK TABLE 60X90IN (DRAPES) ×3 IMPLANT
GLOVE BIO SURGEON STRL SZ7 (GLOVE) ×3 IMPLANT
GLOVE BIOGEL PI IND STRL 7.0 (GLOVE) ×2 IMPLANT
GLOVE BIOGEL PI INDICATOR 7.0 (GLOVE) ×4
GOWN STRL REUS W/ TWL LRG LVL3 (GOWN DISPOSABLE) ×2 IMPLANT
GOWN STRL REUS W/TWL LRG LVL3 (GOWN DISPOSABLE) ×6

## 2019-03-03 NOTE — Anesthesia Procedure Notes (Signed)
Procedure Name: MAC Date/Time: 03/03/2019 11:41 AM Performed by: Alain Marion, CRNA Pre-anesthesia Checklist: Patient identified, Emergency Drugs available, Suction available, Patient being monitored and Timeout performed Patient Re-evaluated:Patient Re-evaluated prior to induction Oxygen Delivery Method: Simple face mask Induction Type: IV induction Placement Confirmation: positive ETCO2

## 2019-03-03 NOTE — Transfer of Care (Signed)
Immediate Anesthesia Transfer of Care Note  Patient: Lori Miles  Procedure(s) Performed: EXAM UNDER ANESTHESIA AND PAP SMEAR (N/A Vagina )  Patient Location: PACU, recovery in OR 7  Anesthesia Type:MAC  Level of Consciousness: awake, alert  and oriented  Airway & Oxygen Therapy: Patient Spontanous Breathing and Patient connected to face mask oxygen  Post-op Assessment: Report given to RN and Post -op Vital signs reviewed and stable  Post vital signs: Reviewed and stable  Last Vitals:  Vitals Value Taken Time  BP 106/58   Temp    Pulse 93   Resp 12   SpO2 98     Last Pain:  Vitals:   03/03/19 0931  PainSc: 0-No pain         Complications: No apparent anesthesia complications

## 2019-03-03 NOTE — Interval H&P Note (Signed)
History and Physical Interval Note:  03/03/2019 11:15 AM  Lori Miles  has presented today for surgery, with the diagnosis of Cognitive developmental delay.  The various methods of treatment have been discussed with the patient and family. After consideration of risks, benefits and other options for treatment, the patient has consented to  Procedure(s): EXAM UNDER ANESTHESIA AND PAP SMEAR (N/A) as a surgical intervention.  The patient's history has been reviewed, patient examined, no change in status, stable for surgery.  I have reviewed the patient's chart and labs.  Questions were answered to the patient's satisfaction.     Donnamae Jude

## 2019-03-03 NOTE — Op Note (Signed)
Preoperative diagnosis: Unable to tolerate exam in the office, Need for Pap smear screening  Postoperative diagnosis: Same  Procedure: Exam under anesthesia, Pap smear  Surgeon: Darron Doom, M.D.  Anesthesia: MAC  Findings: 6 week size uterus, no adnexal mass  Estimated blood loss: Less than 25 cc  Specimen: None  Reason for procedure: Patient is a 23 y.o. patient who is unable to tolerate exam in the office. She has never had a Pap smear and requests screening.  Procedure: Patient taken to the OR where she was placed in dorsal lithotomy in [place]. A timeout was performed. A speculum was placed inside the vagina. The cervix was visualized and Pap smear obtained. The speculum was removed from the vagina. An exam revealed 6 week size uterus that was firm. There were no adnexal mass or tenderness. The patient was awakened and taken to recovery, stable.  Donnamae Jude, MD 03/03/2019 12:13 PM

## 2019-03-04 LAB — CYTOLOGY - PAP: Diagnosis: NEGATIVE

## 2019-03-04 NOTE — Anesthesia Postprocedure Evaluation (Signed)
Anesthesia Post Note  Patient: Lori Miles  Procedure(s) Performed: EXAM UNDER ANESTHESIA AND PAP SMEAR (N/A Vagina )     Patient location during evaluation: PACU Anesthesia Type: MAC Level of consciousness: awake Pain management: pain level controlled Vital Signs Assessment: post-procedure vital signs reviewed and stable Respiratory status: spontaneous breathing, nonlabored ventilation, respiratory function stable and patient connected to nasal cannula oxygen Cardiovascular status: stable and blood pressure returned to baseline Postop Assessment: no apparent nausea or vomiting Anesthetic complications: no    Last Vitals:  Vitals:   03/03/19 1200 03/03/19 1215  BP: 117/66   Pulse: 95   Resp: 14   Temp: 36.4 C   SpO2: 100% 100%    Last Pain:  Vitals:   03/03/19 1215  PainSc: 0-No pain                 Catalina Gravel

## 2019-03-09 DIAGNOSIS — Z87440 Personal history of urinary (tract) infections: Secondary | ICD-10-CM | POA: Diagnosis not present

## 2019-03-10 ENCOUNTER — Other Ambulatory Visit (INDEPENDENT_AMBULATORY_CARE_PROVIDER_SITE_OTHER): Payer: Self-pay | Admitting: Family

## 2019-03-10 DIAGNOSIS — G4700 Insomnia, unspecified: Secondary | ICD-10-CM

## 2019-03-10 MED ORDER — TRAZODONE HCL 50 MG PO TABS: 100.0000 mg | ORAL_TABLET | Freq: Every day | ORAL | 5 refills | Status: DC

## 2019-03-15 ENCOUNTER — Telehealth (INDEPENDENT_AMBULATORY_CARE_PROVIDER_SITE_OTHER): Payer: Self-pay | Admitting: Family

## 2019-03-15 DIAGNOSIS — G40309 Generalized idiopathic epilepsy and epileptic syndromes, not intractable, without status epilepticus: Secondary | ICD-10-CM

## 2019-03-15 DIAGNOSIS — G40209 Localization-related (focal) (partial) symptomatic epilepsy and epileptic syndromes with complex partial seizures, not intractable, without status epilepticus: Secondary | ICD-10-CM

## 2019-03-15 DIAGNOSIS — R569 Unspecified convulsions: Secondary | ICD-10-CM | POA: Diagnosis not present

## 2019-03-15 DIAGNOSIS — Z049 Encounter for examination and observation for unspecified reason: Secondary | ICD-10-CM | POA: Diagnosis not present

## 2019-03-15 MED ORDER — DEPAKOTE 500 MG PO TBEC: DELAYED_RELEASE_TABLET | ORAL | 1 refills | Status: DC

## 2019-03-15 NOTE — Telephone Encounter (Signed)
Prescription was sent

## 2019-03-15 NOTE — Telephone Encounter (Signed)
  Who's calling (name and relationship to patient) : Mother / Makensie Mulhall   Best contact number: [phone]  Provider they see: Rockwell Germany   Reason for call: calling about there prescription Depakote needing a refill     PRESCRIPTION REFILL ONLY  Name of prescription: Depakote   Pharmacy: [facility]

## 2019-03-15 NOTE — Telephone Encounter (Signed)
Please send to the pharmacy °

## 2019-03-19 DIAGNOSIS — R569 Unspecified convulsions: Secondary | ICD-10-CM | POA: Diagnosis not present

## 2019-03-21 DIAGNOSIS — R319 Hematuria, unspecified: Secondary | ICD-10-CM | POA: Diagnosis not present

## 2019-03-21 DIAGNOSIS — N390 Urinary tract infection, site not specified: Secondary | ICD-10-CM | POA: Diagnosis not present

## 2019-03-22 DIAGNOSIS — H109 Unspecified conjunctivitis: Secondary | ICD-10-CM | POA: Diagnosis not present

## 2019-03-24 ENCOUNTER — Telehealth (INDEPENDENT_AMBULATORY_CARE_PROVIDER_SITE_OTHER): Payer: Self-pay | Admitting: Family

## 2019-03-24 DIAGNOSIS — G40209 Localization-related (focal) (partial) symptomatic epilepsy and epileptic syndromes with complex partial seizures, not intractable, without status epilepticus: Secondary | ICD-10-CM

## 2019-03-24 DIAGNOSIS — F840 Autistic disorder: Secondary | ICD-10-CM

## 2019-03-24 DIAGNOSIS — R296 Repeated falls: Secondary | ICD-10-CM

## 2019-03-24 DIAGNOSIS — F819 Developmental disorder of scholastic skills, unspecified: Secondary | ICD-10-CM

## 2019-03-24 DIAGNOSIS — G40309 Generalized idiopathic epilepsy and epileptic syndromes, not intractable, without status epilepticus: Secondary | ICD-10-CM

## 2019-03-24 NOTE — Telephone Encounter (Signed)
Mom called to request an updated PT referral for [facility]. I will send in the referral as requested. TG

## 2019-03-25 DIAGNOSIS — J4520 Mild intermittent asthma, uncomplicated: Secondary | ICD-10-CM | POA: Diagnosis not present

## 2019-03-29 ENCOUNTER — Other Ambulatory Visit: Payer: Self-pay

## 2019-03-29 ENCOUNTER — Encounter: Payer: Self-pay | Admitting: Internal Medicine

## 2019-03-29 ENCOUNTER — Ambulatory Visit (INDEPENDENT_AMBULATORY_CARE_PROVIDER_SITE_OTHER): Payer: Medicare Other | Admitting: Internal Medicine

## 2019-03-29 VITALS — BP 109/74 | HR 103 | Temp 98.0°F | Wt 185.0 lb

## 2019-03-29 DIAGNOSIS — N390 Urinary tract infection, site not specified: Secondary | ICD-10-CM

## 2019-03-29 MED ORDER — FOSFOMYCIN TROMETHAMINE 3 G PO PACK: PACK | ORAL | 0 refills | Status: DC

## 2019-03-29 MED ORDER — FOSFOMYCIN TROMETHAMINE 3 G PO PACK: 3.0000 g | PACK | ORAL | Status: DC

## 2019-03-29 NOTE — Progress Notes (Signed)
RFV: new visit for recurrent uti  Patient ID: Lori Miles, female   DOB: 1995-10-01, 24 y.o.   MRN: [id number]  HPI Lori Miles is a 24yo F with development delay, seizure disorder and Has hx of multiple drug allergies with recurrent UTI. She is here with her mother who explains her clinical history.  Nov urine cx - showed kleb pneumoniae which is at 50-100K -ESBL, and proteus 10-50,000CFU. Cipro R, cefuroxime I, nitorfurantoin R, bactrim S, - which looks like it dates back through august-  Blood in urine, frequency, -- also seizure starts back up (  And also sinus works up.   I have reviewed her outside notes.  abtx allergies/intolerances:  Bactrim - stomach cramps, - no improve with tylenol Nitrofurantoin - stomach cramps, improved with tylenol Amox/clav = hivs, desquamation of skin  Outpatient Encounter Medications as of 03/29/2019  Medication Sig  . acetaminophen (TYLENOL) 325 MG tablet Take 2 tablets (650 mg total) by mouth every 6 (six) hours as needed for mild pain or moderate pain.  Marland Kitchen albuterol (PROVENTIL) (2.5 MG/3ML) 0.083% nebulizer solution INHALE CONTENTS OF 1 VIAL IN NEBULIZER EVERY 4 TO 6 HOURS IF NEEDED FOR COUGH OR WHEEZING (Patient taking differently: Take 2.5 mg by nebulization every 4 (four) hours as needed for wheezing or shortness of breath. )  . benzonatate (TESSALON) 200 MG capsule TAKE 1 CAPSULE(200 MG) BY MOUTH THREE TIMES DAILY AS NEEDED (Patient taking differently: Take 200 mg by mouth 3 (three) times daily as needed for cough. )  . budesonide (PULMICORT) 1 MG/2ML nebulizer solution Inhale 1 vial 4 times per day, during upper respiratory infections and asthma flares for 1-2 weeks  . cephALEXin (KEFLEX) 500 MG capsule Take 500 mg by mouth 3 (three) times daily.  . clotrimazole-betamethasone (LOTRISONE) cream Apply to affected area 2 times daily prn (Patient taking differently: Apply 1 application topically daily. )  . DEPAKOTE 500 MG DR tablet TAKE 1 TABLET  BY MOUTH IN THE MORNING AND 2 AT NIGHT  . famotidine (PEPCID) 20 MG tablet Take 1 tablet (20 mg total) by mouth every 12 (twelve) hours.  . fluconazole (DIFLUCAN) 150 MG tablet Take 150 mg by mouth as needed (when on antibiotics).  . hydrOXYzine (ATARAX/VISTARIL) 25 MG tablet Take 25 mg by mouth 3 (three) times daily as needed (pain).   Marland Kitchen levofloxacin (LEVAQUIN) 750 MG tablet Take 750 mg by mouth daily.  . Levonorgestrel-Ethinyl Estradiol (SEASONIQUE) 0.15-0.03 &0.01 MG tablet Take 1 tablet by mouth daily.  . Melatonin 3 MG TABS Take 3 mg by mouth at bedtime.   . montelukast (SINGULAIR) 10 MG tablet TAKE 1 TABLET(10 MG) BY MOUTH AT BEDTIME  . Nebulizer MISC Adult mask with tubing. Use as directed with nebulizer device.  . ondansetron (ZOFRAN ODT) 4 MG disintegrating tablet Take 1 tablet (4 mg total) by mouth every 8 (eight) hours as needed for nausea. (Patient taking differently: Take 8 mg by mouth every 8 (eight) hours as needed for nausea. )  . PERFOROMIST 20 MCG/2ML nebulizer solution USE 2 MLS BY NEBULIZATION TWICE DAILY  . polyethylene glycol powder (GLYCOLAX/MIRALAX) 17 GM/SCOOP powder Dissolve 17 grams (1 capful) in at least 8 ounces of water/juice and drink twice daily  . senna (SENOKOT) 8.6 MG tablet Take 1 tablet by mouth at bedtime.   Marland Kitchen terconazole (TERAZOL 7) 0.4 % vaginal cream Place 1 applicator vaginally at bedtime.  . traZODone (DESYREL) 50 MG tablet Take 2 tablets (100 mg total) by mouth at bedtime.  Marland Kitchen  YUPELRI 175 MCG/3ML SOLN USE 1 VIAL IN NEBULIZER DAILY - DO NOT MIX WITH OTHER NEB MEDS,USE BEFORE OR AFTER  . cefUROXime (CEFTIN) 500 MG tablet SMARTSIG:1 Tablet(s) By Mouth Every 12 Hours  . cetirizine (ZYRTEC) 10 MG tablet TAKE 1 TABLET(10 MG) BY MOUTH DAILY (Patient not taking: Reported on 03/29/2019)  . nitrofurantoin (MACRODANTIN) 100 MG capsule Take 100 mg by mouth 2 (two) times daily. For 7 days, ordered 12/13.   No facility-administered encounter medications on file as of  03/29/2019.     Patient Active Problem List   Diagnosis Date Noted  . Screening for cervical cancer 02/26/2019  . Glucosuria 06/01/2018  . 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 Not well controlled moderate persistent asthma 04/29/2018  . Gastroesophageal reflux disease 04/29/2018  . Recurrent infections 04/29/2018  . Dysuria 02/20/2018  . Chronic rhinitis 04/03/2017  . Recurrent falls 03/21/2017  . Weakness 03/21/2017  . Venous insufficiency 03/21/2017  . Excessive daytime sleepiness 03/05/2016  . 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 Cough variant asthma vs UACS/ vcd  03/15/2015  . Autism spectrum disorder 02/14/2015  . Nausea with vomiting 02/14/2015  . Obesity, morbid ([place]) 12/13/2014  . 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 Acanthosis nigricans 11/14/2014  . Toeing-in 08/29/2014  . 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 Partial epilepsy with impairment of consciousness ([place]) 09/28/2012  . 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 Recurrent urinary tract infection 08/01/2011  . Asthma 08/01/2011  . Chronic constipation 08/01/2011  . Contraception 08/01/2011     Health Maintenance Due  Topic Date Due  . HIV Screening  10/04/2009  . INFLUENZA VACCINE  10/17/2018     Review of Systems  Physical Exam   BP 109/74   Pulse (!) 103   Temp 98 F (36.7 C)   Wt 185 lb (83.9 kg)   BMI 40.03 kg/m    Physical Exam  Constitutional:  oriented to person, place, and time. appears well-developed and well-nourished. No distress.  HENT: [place]/AT, PERRLA, no scleral icterus Mouth/Throat: Oropharynx is clear and moist. No oropharyngeal exudate.  Abdominal: Soft. Bowel sounds are normal.  exhibits no distension. There is no tenderness.  Lymphadenopathy: no cervical adenopathy. No axillary adenopathy Neurological: alert and oriented to person, place, and  time.  Skin: Skin is warm and dry. No rash noted. No erythema.  Psychiatric: a normal mood and affect.  behavior is normal.   CBC Lab Results  Component Value Date   WBC 9.1 03/03/2019   RBC 4.17 03/03/2019   HGB 12.6 03/03/2019   HCT 37.5 03/03/2019   PLT 254 03/03/2019   MCV 89.9 03/03/2019   MCH 30.2 03/03/2019   MCHC 33.6 03/03/2019   RDW 13.6 03/03/2019   LYMPHSABS 5.2 (H) 09/10/2018   MONOABS 1.3 (H) 09/10/2018   EOSABS 0.0 09/10/2018    BMET Lab Results  Component Value Date   NA 136 03/03/2019   K 3.9 03/03/2019   CL 102 03/03/2019   CO2 22 03/03/2019   GLUCOSE 81 03/03/2019   BUN 9 03/03/2019   CREATININE 0.87 03/03/2019   CALCIUM 9.0 03/03/2019   GFRNONAA >60 03/03/2019   GFRAA >60 03/03/2019      Assessment and Plan  Complicated uti with esbl proteus and klebsiella esbl = presumed uti, since painful, hematuria, I don't think that the medications that she received who have treated her symptoms. For now, we will  treat as complicated course -  Will give fosfomycin for 4 doses to give every 3 days. Will try to avoid picc line at this time.  Will need to check with [facility].   Dr Michel Harrow nurse -tammy

## 2019-03-29 NOTE — Progress Notes (Signed)
Patient's mother later called office back to ask if she could have Dr. Drue Second personal phone number/email. Advise family that she could utilize MyChart for any questions/concerns that may arise at anytime. Mother declined. Advise family to call the office whenever needed and speak with triage nurse to reach MD if needed.  Family was understanding.   Fosfomycin Rx sent to pharmacy per family request. Medication was previous entered as clinic administered medication.  New rx will need MD co-signature.

## 2019-03-29 NOTE — Progress Notes (Signed)
Called [phone]) Dr. Logan Bores triage nurse [name] and left VM regarding needing most recent urine culture results faxed to [phone] for Dr. Drue Second to review.  Lori Miles

## 2019-03-30 ENCOUNTER — Other Ambulatory Visit: Payer: Medicare Other

## 2019-03-30 ENCOUNTER — Telehealth: Payer: Self-pay

## 2019-03-30 ENCOUNTER — Other Ambulatory Visit: Payer: Self-pay | Admitting: *Deleted

## 2019-03-30 DIAGNOSIS — N390 Urinary tract infection, site not specified: Secondary | ICD-10-CM

## 2019-03-30 NOTE — Telephone Encounter (Signed)
Began prior authorization for patient's fosfomycin  Reference #: [id number]  Rosanna Randy, RN

## 2019-03-31 NOTE — Telephone Encounter (Signed)
Received approved prior authorization for patient's fosfomycin. Will notify pharmacy of approved status.   Koehn Salehi Loyola Mast, RN

## 2019-04-01 LAB — URINE CULTURE
MICRO NUMBER:: 10036897
SPECIMEN QUALITY:: ADEQUATE

## 2019-04-01 LAB — URINALYSIS, ROUTINE W REFLEX MICROSCOPIC
Bilirubin Urine: NEGATIVE
Glucose, UA: NEGATIVE
Hgb urine dipstick: NEGATIVE
Hyaline Cast: NONE SEEN /LPF
Nitrite: NEGATIVE
Protein, ur: NEGATIVE
Specific Gravity, Urine: 1.023 (ref 1.001–1.03)
pH: 6.5 (ref 5.0–8.0)

## 2019-04-05 ENCOUNTER — Telehealth: Payer: Self-pay

## 2019-04-05 NOTE — Telephone Encounter (Signed)
Patient's mother called office regarding medication change. States that since medication changed patient has been "wetting more". Patients mother would like to know what she should do regarding this.  Patient's mother would also like to inform MD that patient has been having episodes of diarrhea. Last episode was last weekend.  Would like to discuss medication change if possible. Will forward message to MD to advise on patient's concerns. Would like call back. Lorenso Courier, [place]

## 2019-04-07 NOTE — Telephone Encounter (Signed)
Patient's [name] called with follow up information. 1 - she would like the results of the urine they dropped off last week 2 - Jenessa took the fosfomycin 1/13 and 1/16, none since then. Mom feels it is not working, might do another dose tomorrow.  She states that Anatasia is having her regular symptoms of a UTI - mucous tinged with blood in her nose, daily vomiting of yellow substance, had a seizure 1/16 and 1/18.  She said Ranata's diarrhea has stopped, but she is still incontinent of urine and will wet through 2 adult diapers.  She is worried about Defne contracting pneumonia due to the mucous in her nose.  [name] would like a call back today to discuss the medication and alternatives. [phone]. Andree Coss, RN

## 2019-04-07 NOTE — Telephone Encounter (Signed)
RN called Patient's mother back, relayed the urine analysis results and Dr Feliz Beam advice.  Dr Drue Second wants Lori Miles 1) to finish the fosfomycin as directed, 2) to take Lori Miles to her primary care for assessment of sinus issues while she is on treatment with Fosfomycin for suspected/complicated UTI.   Patient's mother repeatedly refuses, states she needs another antibiotic for a sinus UTI, that she will get her daughter's urologist to call and speak with Dr Drue Second.   Patient's mother would not say when she had been seen by primary, only repeating that she needs medicine for sinus UTI and disconnected the call. Andree Coss, RN

## 2019-04-08 DIAGNOSIS — J019 Acute sinusitis, unspecified: Secondary | ICD-10-CM | POA: Diagnosis not present

## 2019-04-08 DIAGNOSIS — R300 Dysuria: Secondary | ICD-10-CM | POA: Diagnosis not present

## 2019-04-13 ENCOUNTER — Telehealth: Payer: Self-pay

## 2019-04-13 NOTE — Telephone Encounter (Signed)
Received call from Mother of patient stating. She had stopped patient short of one dose of fosfomycin pack due to patient's face breaking out.  Mother has since taken patient to [facility] PCP and was switched to Keflex and then changed again to Cipro.   Patient wanted Dr. Drue Second to know and asks her to call her if she has any additional advise.  Valarie Cones

## 2019-04-15 DIAGNOSIS — Z049 Encounter for examination and observation for unspecified reason: Secondary | ICD-10-CM | POA: Diagnosis not present

## 2019-04-15 DIAGNOSIS — R569 Unspecified convulsions: Secondary | ICD-10-CM | POA: Diagnosis not present

## 2019-04-19 NOTE — Telephone Encounter (Signed)
Patient mother called office back regarding message from last week. Patient's mother would also like to inform MD she has some results she would like for her to review. Will bring a copy of results to office tomorrow. Lorenso Courier, [place]

## 2019-04-26 ENCOUNTER — Telehealth: Payer: Self-pay | Admitting: Acute Care

## 2019-04-26 ENCOUNTER — Encounter: Payer: Self-pay | Admitting: Acute Care

## 2019-04-26 ENCOUNTER — Other Ambulatory Visit: Payer: Self-pay | Admitting: Physician Assistant

## 2019-04-26 ENCOUNTER — Other Ambulatory Visit: Payer: Self-pay

## 2019-04-26 ENCOUNTER — Ambulatory Visit: Payer: Medicare Other | Admitting: Internal Medicine

## 2019-04-26 ENCOUNTER — Telehealth: Payer: Self-pay | Admitting: Adult Health

## 2019-04-26 ENCOUNTER — Ambulatory Visit
Admission: RE | Admit: 2019-04-26 | Discharge: 2019-04-26 | Disposition: A | Discharge status: Home / Self Care | Payer: Medicare Other | Source: Ambulatory Visit | Attending: Physician Assistant | Admitting: Physician Assistant

## 2019-04-26 ENCOUNTER — Ambulatory Visit (INDEPENDENT_AMBULATORY_CARE_PROVIDER_SITE_OTHER): Payer: Medicare Other | Admitting: Acute Care

## 2019-04-26 VITALS — BP 118/78 | HR 107 | Temp 97.5°F

## 2019-04-26 DIAGNOSIS — R062 Wheezing: Secondary | ICD-10-CM

## 2019-04-26 DIAGNOSIS — R112 Nausea with vomiting, unspecified: Secondary | ICD-10-CM | POA: Diagnosis not present

## 2019-04-26 DIAGNOSIS — N390 Urinary tract infection, site not specified: Secondary | ICD-10-CM | POA: Diagnosis not present

## 2019-04-26 DIAGNOSIS — J4540 Moderate persistent asthma, uncomplicated: Secondary | ICD-10-CM

## 2019-04-26 DIAGNOSIS — R0600 Dyspnea, unspecified: Secondary | ICD-10-CM | POA: Diagnosis not present

## 2019-04-26 DIAGNOSIS — G4733 Obstructive sleep apnea (adult) (pediatric): Secondary | ICD-10-CM

## 2019-04-26 DIAGNOSIS — Z9989 Dependence on other enabling machines and devices: Secondary | ICD-10-CM

## 2019-04-26 LAB — D-DIMER, QUANTITATIVE: D-Dimer, Quant: 0.21 mcg/mL FEU (ref <0.50)

## 2019-04-26 LAB — PROTIME-INR
INR: 1.1 ratio — ABNORMAL HIGH (ref 0.8–1.0)
Prothrombin Time: 12.5 s (ref 9.6–13.1)

## 2019-04-26 MED ORDER — PREDNISONE 10 MG PO TABS: ORAL_TABLET | ORAL | 0 refills | Status: DC

## 2019-04-26 NOTE — Patient Instructions (Addendum)
It is nice to see you today.  Please continue to push fluids . If she is not making urine, call your PCP.  Continue taking your antibiotics as you have been doing. Continue Pulmicort Nebs and Performist nebs as you have been doing Continue Zyrtec daily as you have been doing Continue Singulair daily Continue Yupelri nebs as you have been doing Continue nasal spray.  We will order an incentive spirometer We will check a d dimer today. We will check some other labs.( PT, INR, PTT) We will do a short prednisone taper Prednisone taper; 10 mg tablets: 4 tabs x 1 days, 3 tabs x 1 days, 2 tabs x 1 days 1 tab x 1 days then stop. Continue the [employer] as you have been doing If you have bleeding that does not stop, please seek emergency care.   Please contact office for sooner follow up if symptoms do not improve or worsen or seek emergency care

## 2019-04-26 NOTE — Telephone Encounter (Signed)
[name] called back. The pharmacy does have medication and this was picked up. [name] verbalized understanding and denied any further questions or concerns at this time.

## 2019-04-26 NOTE — Telephone Encounter (Signed)
Went to the lobby to speak with the pt's mother  She was already checking in bc someone had scheduled the pt appt to see SG

## 2019-04-26 NOTE — Progress Notes (Signed)
History of Present Illness Lori Miles is a 24 y.o. female  never smoker that has a complicated medical history that includes cerebral palsy, mental insufficiency with severe behavioral issues and autism.  She has an underlying seizure disorder.  She also has asthma and allergic rhinitis followed by Dr. Lucie Leather with asthma and allergy. Patient has ongoing sleep issues and daytime hypersomnia, narcolepsy.  She has had multiple sleep studies to evaluate this.  Sleep studies have been negative for sleep apnea in the past, and only notable for primary snoring. Medicaid has refused to pay for CPAP machine as sleep studies do no document any significant sleep apnea. He mother is planning on having her re-evaluated at another sleep center. She is followed by Dr. Craige Cotta Dr. Maple Hudson . Maintenance Medications : Pulmicort Nebs Performist Nebs Zyrtec Singulair Yupelri nebs   05/05/2019 Acute Visit for Respiratory Problems per Mom Mother is also presenting history of UTI and nausea and vomiting which is being managed per PCP.  Pt. Presents today with complaints of breathing issues.  She is seen by Dr. Maple Hudson for OSA. She presents with symptoms that are more sinus related/ pulmonary . She has been seen by many physicians. She has recently been treated with Cipro and Levaquin for UTI positive for  Klebsiella pneumonia.  She was at [facility] on today  for a urine check for UTI. They did collect urine today. Per the mom, she was sent from [place] over to [name] today, as they felt she was having a breathing issue. She had a CXR 2/8, which showed Right lung base atelectasis , otherwise clear lungs.  Per mom she has been vomiting. She is taking Zofran. She has been wheezing and has had sinus issues. She has yellow secretions and per mom bloody secretions.She states there was blood in her secretions that the [facility] physicians were worried. Pt is  using saline spray  and gel , she is using her nebs and allergy medications.She is  using her tessalon Perles for cough. She denies  any leg pain.  Per mom she has been much less active. We will check a d dimer to r/o PE, as patient is on BCP's.   I have asked Mitsuko's mom to let PCP manage the UTI and Nausea and vomiting as they have been doing. I have advised her to use the Zofran and to push fluids as able. I have advised her to seek emergency care if symptoms worsen rather than improve.   She now has a CPAP machine. She wears this every night. Her mom states she has had less daytime sleepiness, but she is currently asleep in a wheelchair.  Sinuses appear dry upon inspection.    Test Results: 04/26/2019 CXR Mild right base atelectasis. Lungs elsewhere clear. Cardiac silhouette normal. PSG 09/22/18 >>total sleep time 367.5 min. AHI 0.7, SpO2 low 89%, PLMI 0. Took trazodone and melatonin. MSLT 09/23/18 >>sleep onset in 5 of 5 naps with mean sleep latency 29 seconds. 1 of 5 naps with SOREM.  Sleep study January 06, 2015 at [place] sleep study at [facility] AHI 0.4 (total sleep time 428 minutes)moderate to loud snoring -Sleep study March 27, 2016 AHI 0.1 SaO2 low 90%, positive snoring -Sleep study December 10, 2017 AHI 0.3, SaO2 low 93% (sleep time 413 minutes) positive snoring  -A CPAP titration study was ordered June 2018 with known negative diagnostic sleep study with elimination of snoring on CPAP 10 cm H2O total sleep time 367 minutes with sleep  efficiency 98%  CBC Latest Ref Rng & Units 04/27/2019 03/03/2019 09/10/2018  WBC 4.0 - 10.5 K/uL 16.3(H) 9.1 12.0(H)  Hemoglobin 12.0 - 15.0 g/dL 41.2 87.8 11.8(L)  Hematocrit 36.0 - 46.0 % 38.6 37.5 36.1  Platelets 150 - 400 K/uL 257 254 254    BMP Latest Ref Rng & Units 04/27/2019 03/03/2019 09/10/2018  Glucose 70 - 99 mg/dL 676(H) 81 209(O)  BUN 6 - 20 mg/dL 13 9 14   Creatinine 0.44 - 1.00 mg/dL 7.09 6.28  Sodium 135 - 145 mmol/L 137 136 139  Potassium 3.5 - 5.1 mmol/L 4.1 3.9 3.8    Chloride 98 - 111 mmol/L 106 102 107  CO2 22 - 32 mmol/L 18(L) 22 24  Calcium 8.9 - 10.3 mg/dL 8.9 9.0 3.66)    BNP    Component Value Date/Time   BNP 16.1 10/24/2014 0745    ProBNP    Component Value Date/Time   PROBNP 9.0 05/05/2015 1604    PFT No results found for: FEV1PRE, FEV1POST, FVCPRE, FVCPOST, TLC, DLCOUNC, PREFEV1FVCRT, PSTFEV1FVCRT  DG Chest 2 View  Result Date: 04/28/2019 CLINICAL DATA:  Shortness of breath, productive cough began this week EXAM: CHEST - 2 VIEW COMPARISON:  Radiograph 04/27/2019 FINDINGS: Slightly low volumes with some basilar atelectasis. Lungs are otherwise clear. No consolidation, features of edema, pneumothorax, or effusion. The cardiomediastinal contours are unremarkable. No acute osseous or soft tissue abnormality. IMPRESSION: Low volumes and atelectasis, otherwise no acute cardiopulmonary disease. Electronically Signed   By: 06/25/2019 M.D.   On: 04/28/2019 03:19   DG Chest 2 View  Result Date: 04/26/2019 CLINICAL DATA:  Cough and wheezing EXAM: CHEST - 2 VIEW COMPARISON:  September 09, 2018 FINDINGS: There is mild right base atelectasis. The lungs elsewhere are clear. Heart size and pulmonary vascularity are normal. No adenopathy. There is lower thoracic levoscoliosis. IMPRESSION: Mild right base atelectasis. Lungs elsewhere clear. Cardiac silhouette normal. Electronically Signed   By: September 11, 2018 III M.D.   On: 04/26/2019 07:44   DG Chest Portable 1 View  Result Date: 04/27/2019 CLINICAL DATA:  Cough and shortness of breath. Epistaxis. Vomiting. Hematuria. EXAM: PORTABLE CHEST 1 VIEW COMPARISON:  04/26/2019 FINDINGS: Poor inspiration. Grossly stable normal sized heart. Clear lungs with normal vascularity. Unremarkable bones. IMPRESSION: No acute abnormality. Electronically Signed   By: 06/24/2019 M.D.   On: 04/27/2019 12:15     Past medical hx Past Medical History:  Diagnosis Date  . Asthma   . Autism    mom states no actual dx, but  admits she has some symptoms  . Bacteriuria   . Chronic constipation   . Cognitive developmental delay   . Constipation   . Disruptive behavior disorder   . Dyspnea    if she walks much  . Family history of adverse reaction to anesthesia     "hives"-? if anesthesia did take antibiotic  . Frequency-urgency syndrome   . Gait disorder    ORGANIC PER NERUOLGIST NOTE (DR HICKLING)  . 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 GERD (gastroesophageal reflux disease)   . Gout   . H/O sinus tachycardia   . History of acute respiratory failure 02/14/2015   SECONDARY TO CAP  . History of recurrent UTIs   . Interstitial cystitis   . Moderate intellectual disability   . OSA (obstructive sleep apnea)    no machine yet   . Partial epilepsy with impairment of consciousness (HCC)    FOLLOWED BY  DR HICKLING  . Pneumonia 2    4 times this year ( 2020)  . PONV (postoperative nausea and vomiting)   . Seizure ([place])    most recent sz " at the end of summer"-last sz years ago per mother  . Urinary incontinence      Social History   Tobacco Use  . Smoking status: Never Smoker  . Smokeless tobacco: Never Used  Substance Use Topics  . Alcohol use: No    Alcohol/week: 0.0 standard drinks  . Drug use: No    Ms.Dor reports that she has never smoked. She has never used smokeless tobacco. She reports that she does not drink alcohol or use drugs.  Tobacco Cessation: Never smoker   Past surgical hx, Family hx, Social hx all reviewed.  Current Outpatient Medications on File Prior to Visit  Medication Sig  . acetaminophen (TYLENOL) 325 MG tablet Take 2 tablets (650 mg total) by mouth every 6 (six) hours as needed for mild pain or moderate pain.  Marland Kitchen albuterol (PROVENTIL) (2.5 MG/3ML) 0.083% nebulizer solution INHALE CONTENTS OF 1 VIAL IN NEBULIZER EVERY 4 TO 6 HOURS IF NEEDED FOR COUGH OR WHEEZING (Patient taking differently: Take 2.5 mg by nebulization every 4 (four)  hours as needed for wheezing or shortness of breath. )  . benzonatate (TESSALON) 200 MG capsule TAKE 1 CAPSULE(200 MG) BY MOUTH THREE TIMES DAILY AS NEEDED (Patient taking differently: Take 200 mg by mouth 3 (three) times daily as needed for cough. )  . budesonide (PULMICORT) 1 MG/2ML nebulizer solution Inhale 1 vial 4 times per day, during upper respiratory infections and asthma flares for 1-2 weeks  . cefUROXime (CEFTIN) 500 MG tablet SMARTSIG:1 Tablet(s) By Mouth Every 12 Hours  . cephALEXin (KEFLEX) 500 MG capsule Take 500 mg by mouth 3 (three) times daily.  . cetirizine (ZYRTEC) 10 MG tablet TAKE 1 TABLET(10 MG) BY MOUTH DAILY  . clotrimazole-betamethasone (LOTRISONE) cream Apply to affected area 2 times daily prn (Patient taking differently: Apply 1 application topically daily. )  . DEPAKOTE 500 MG DR tablet TAKE 1 TABLET BY MOUTH IN THE MORNING AND 2 AT NIGHT  . famotidine (PEPCID) 20 MG tablet Take 1 tablet (20 mg total) by mouth every 12 (twelve) hours.  . fluconazole (DIFLUCAN) 150 MG tablet Take 150 mg by mouth as needed (when on antibiotics).  . fosfomycin (MONUROL) 3 g PACK Take 3 grams by mouth  every 3 DAYS for 4 doses  . hydrOXYzine (ATARAX/VISTARIL) 25 MG tablet Take 25 mg by mouth 3 (three) times daily as needed (pain).   Marland Kitchen levofloxacin (LEVAQUIN) 750 MG tablet Take 750 mg by mouth daily.  . Levonorgestrel-Ethinyl Estradiol (SEASONIQUE) 0.15-0.03 &0.01 MG tablet Take 1 tablet by mouth daily.  . Melatonin 3 MG TABS Take 3 mg by mouth at bedtime.   . montelukast (SINGULAIR) 10 MG tablet TAKE 1 TABLET(10 MG) BY MOUTH AT BEDTIME  . Nebulizer MISC Adult mask with tubing. Use as directed with nebulizer device.  . nitrofurantoin (MACRODANTIN) 100 MG capsule Take 100 mg by mouth 2 (two) times daily. For 7 days, ordered 12/13.  Marland Kitchen ondansetron (ZOFRAN ODT) 4 MG disintegrating tablet Take 1 tablet (4 mg total) by mouth every 8 (eight) hours as needed for nausea. (Patient taking differently:  Take 8 mg by mouth every 8 (eight) hours as needed for nausea. )  . PERFOROMIST 20 MCG/2ML nebulizer solution USE 2 MLS BY NEBULIZATION TWICE DAILY  . polyethylene glycol powder (GLYCOLAX/MIRALAX) 17 GM/SCOOP  powder Dissolve 17 grams (1 capful) in at least 8 ounces of water/juice and drink twice daily  . senna (SENOKOT) 8.6 MG tablet Take 1 tablet by mouth at bedtime.   Marland Kitchen terconazole (TERAZOL 7) 0.4 % vaginal cream Place 1 applicator vaginally at bedtime.  . traZODone (DESYREL) 50 MG tablet Take 2 tablets (100 mg total) by mouth at bedtime.  Mikael Spray 175 MCG/3ML SOLN USE 1 VIAL IN NEBULIZER DAILY - DO NOT MIX WITH OTHER NEB MEDS,USE BEFORE OR AFTER   No current facility-administered medications on file prior to visit.     Allergies  Allergen Reactions  . Abilify [Aripiprazole] Swelling and Palpitations  . Seroquel [Quetiapine Fumarate] Palpitations  . Amoxicillin-Pot Clavulanate Hives and Other (See Comments)    Has patient had a PCN reaction causing immediate rash, facial/tongue/throat swelling, SOB or lightheadedness with hypotension: No Has patient had a PCN reaction causing severe rash involving mucus membranes or skin necrosis:    #  #  YES  #  #  Has patient had a PCN reaction that required hospitalization: No Has patient had a PCN reaction occurring within the last 10 years: No   . Bactrim [Sulfamethoxazole-Trimethoprim] Other (See Comments)    Abdominal pain  . Doxycycline Other (See Comments)    Stomach cramps  . Macrobid [Nitrofurantoin Macrocrystal] Other (See Comments)    Sharp pain/ given with Tylenol 325 mg  . Keppra [Levetiracetam] Other (See Comments)    Aggressive behavior     Review Of Systems: Per Mother  Constitutional:   No  weight loss, night sweats,  Fevers, chills, fatigue, or  lassitude.  HEENT:   No headaches,  Difficulty swallowing,  Tooth/dental problems, or  + Sore throat,                No sneezing, itching, ear ache, nasal congestion, post nasal  drip,   CV:  No chest pain,  Orthopnea, PND, swelling in lower extremities, anasarca, dizziness, palpitations, syncope.   GI  No heartburn, indigestion, abdominal pain, + nausea, + vomiting, No diarrhea, change in bowel habits, loss of appetite, bloody stools.   Resp: No shortness of breath with exertion or at rest.  No excess mucus, + productive cough,  No non-productive cough,  + coughing up of blood.  + change in color of mucus.  + wheezing.  No chest wall deformity  Skin: no rash or lesions.  GU: + dysuria, change in color of urine, no urgency or frequency.  No flank pain, + hematuria   MS:  No joint pain or swelling.  No decreased range of motion.  No back pain.  Psych:  No change in mood or affect. No depression or anxiety.  No memory loss.   Vital Signs BP 118/78 (BP Location: Left Arm, Cuff Size: Large)   Pulse (!) 107   Temp (!) 97.5 F (36.4 C) (Oral)   SpO2 97%    Physical Exam:  General- No distress,  A&Ox3, sitting in wheel chair asking for water ENT: No sinus tenderness, TM clear, dry,  pale nasal mucosa, no oral exudate,no post nasal drip, no LAN Cardiac: S1, S2, regular rate and rhythm, no murmur Chest: No wheeze/ rales/ dullness; diminished per bases, no accessory muscle use, no nasal flaring, no sternal retractions Abd.: Soft Non-tender, ND, BS +  Ext: No clubbing cyanosis, edema Neuro:  normal strength, MAE x 4, Alert to person and place , following commands Skin: No rashes,No lesions,  warm and dry Psych:  Patient with autism and cognitive delay   Assessment/Plan  Not well controlled moderate persistent asthma Pt seen for an acute visit for " breathing just not right" per mother. Pt was in no distress upon exam. She was afebrile with a sat of 97% and RR of 16 Plan Continue Pulmicort Nebs and Performist nebs as you have been doing Continue Zyrtec daily as you have been doing Continue Singulair daily Continue Yupelri nebs as you have been  doing Continue nasal spray.  We will order an incentive spirometer We will check a d dimer today. We will check some other labs.( PT, INR, PTT) We will do a short prednisone taper Prednisone taper; 10 mg tablets: 4 tabs x 1 days, 3 tabs x 1 days, 2 tabs x 1 days 1 tab x 1 days then stop. Continue the [employer] as you have been doing If you have bleeding that does not stop, please seek emergency care.   Please contact office for sooner follow up if symptoms do not improve or worsen or seek emergency care    Recurrent urinary tract infection I have asked the patient's mother to have the patient's PCP manage this as they have prescribed antibiotics and are culturing urine  Nausea with vomiting Pt. Is taking zofran  Plan Please continue to push fluids . I have asked the patient's mother to continue to manage this issue. Seek emergency care if condition worsens, or you notice a drop in urination  OSA on CPAP Currently on CPAP therapy Plan Follow up with managing MD. Wear every night for a minimal of 4 hours.   This appointment was over 60  minutes long with over 70% of the time being direct face to face patient care, assessment , plan of care , and follow up,    Bevelyn Ngo, NP 05/05/2019  8:41 PM

## 2019-04-26 NOTE — Telephone Encounter (Signed)
We already sent the prednisone-  Disp Refills Start End   predniSONE (DELTASONE) 10 MG tablet 10 tablet 0 04/26/2019    Sig: Take 4 tabs for 1 day, then 3 tabs for 1 day, 2 tabs for 1 day, then 1 tab for day, then stop   Sent to pharmacy as: predniSONE (DELTASONE) 10 MG tablet   E-Prescribing Status: Receipt confirmed by pharmacy (04/26/2019 11:14 AM EST)    ATC the pt's mother and inform her but there was no answer, and her VM is full

## 2019-04-27 ENCOUNTER — Other Ambulatory Visit: Payer: Self-pay

## 2019-04-27 ENCOUNTER — Emergency Department (HOSPITAL_COMMUNITY)
Admission: EM | Admit: 2019-04-27 | Discharge: 2019-04-28 | Disposition: A | Discharge status: Home / Self Care | Payer: Medicare Other | Source: Home / Self Care | Attending: Emergency Medicine | Admitting: Emergency Medicine

## 2019-04-27 ENCOUNTER — Emergency Department (HOSPITAL_COMMUNITY)
Admission: EM | Admit: 2019-04-27 | Discharge: 2019-04-27 | Disposition: A | Discharge status: Home / Self Care | Payer: Medicare Other | Attending: Emergency Medicine | Admitting: Emergency Medicine

## 2019-04-27 ENCOUNTER — Telehealth: Payer: Self-pay | Admitting: Acute Care

## 2019-04-27 ENCOUNTER — Emergency Department (HOSPITAL_COMMUNITY): Payer: Medicare Other

## 2019-04-27 ENCOUNTER — Telehealth: Payer: Self-pay | Admitting: *Deleted

## 2019-04-27 ENCOUNTER — Encounter (HOSPITAL_COMMUNITY): Payer: Self-pay | Admitting: Emergency Medicine

## 2019-04-27 DIAGNOSIS — Z79891 Long term (current) use of opiate analgesic: Secondary | ICD-10-CM | POA: Insufficient documentation

## 2019-04-27 DIAGNOSIS — R0602 Shortness of breath: Secondary | ICD-10-CM

## 2019-04-27 DIAGNOSIS — F840 Autistic disorder: Secondary | ICD-10-CM | POA: Insufficient documentation

## 2019-04-27 DIAGNOSIS — R479 Unspecified speech disturbances: Secondary | ICD-10-CM | POA: Insufficient documentation

## 2019-04-27 DIAGNOSIS — Z87440 Personal history of urinary (tract) infections: Secondary | ICD-10-CM | POA: Insufficient documentation

## 2019-04-27 DIAGNOSIS — R32 Unspecified urinary incontinence: Secondary | ICD-10-CM | POA: Diagnosis not present

## 2019-04-27 DIAGNOSIS — F79 Unspecified intellectual disabilities: Secondary | ICD-10-CM | POA: Insufficient documentation

## 2019-04-27 DIAGNOSIS — Z79899 Other long term (current) drug therapy: Secondary | ICD-10-CM | POA: Insufficient documentation

## 2019-04-27 LAB — COMPREHENSIVE METABOLIC PANEL
ALT: 22 U/L (ref 0–44)
AST: 26 U/L (ref 15–41)
Albumin: 3 g/dL — ABNORMAL LOW (ref 3.5–5.0)
Alkaline Phosphatase: 42 U/L (ref 38–126)
Anion gap: 13 (ref 5–15)
BUN: 13 mg/dL (ref 6–20)
CO2: 18 mmol/L — ABNORMAL LOW (ref 22–32)
Calcium: 8.9 mg/dL (ref 8.9–10.3)
Chloride: 106 mmol/L (ref 98–111)
Creatinine, Ser: 0.99 mg/dL (ref 0.44–1.00)
GFR calc Af Amer: >60 mL/min (ref >60)
GFR calc non Af Amer: >60 mL/min (ref >60)
Glucose, Bld: 169 mg/dL — ABNORMAL HIGH (ref 70–99)
Potassium: 4.1 mmol/L (ref 3.5–5.1)
Sodium: 137 mmol/L (ref 135–145)
Total Bilirubin: 0.6 mg/dL (ref 0.3–1.2)
Total Protein: 6.4 g/dL — ABNORMAL LOW (ref 6.5–8.1)

## 2019-04-27 LAB — URINALYSIS, ROUTINE W REFLEX MICROSCOPIC
Bacteria, UA: NONE SEEN
Bilirubin Urine: NEGATIVE
Glucose, UA: >=500 mg/dL — AB
Hgb urine dipstick: NEGATIVE
Ketones, ur: 20 mg/dL — AB
Leukocytes,Ua: NEGATIVE
Nitrite: NEGATIVE
Protein, ur: NEGATIVE mg/dL
Specific Gravity, Urine: 1.028 (ref 1.005–1.030)
pH: 5 (ref 5.0–8.0)

## 2019-04-27 LAB — CBC WITH DIFFERENTIAL/PLATELET
Abs Immature Granulocytes: 0.29 10*3/uL — ABNORMAL HIGH (ref 0.00–0.07)
Basophils Absolute: 0 10*3/uL (ref 0.0–0.1)
Basophils Relative: 0 %
Eosinophils Absolute: 0 10*3/uL (ref 0.0–0.5)
Eosinophils Relative: 0 %
HCT: 38.6 % (ref 36.0–46.0)
Hemoglobin: 12.5 g/dL (ref 12.0–15.0)
Immature Granulocytes: 2 %
Lymphocytes Relative: 14 %
Lymphs Abs: 2.3 10*3/uL (ref 0.7–4.0)
MCH: 29.8 pg (ref 26.0–34.0)
MCHC: 32.4 g/dL (ref 30.0–36.0)
MCV: 92.1 fL (ref 80.0–100.0)
Monocytes Absolute: 1 10*3/uL (ref 0.1–1.0)
Monocytes Relative: 6 %
Neutro Abs: 12.6 10*3/uL — ABNORMAL HIGH (ref 1.7–7.7)
Neutrophils Relative %: 78 %
Platelets: 257 10*3/uL (ref 150–400)
RBC: 4.19 MIL/uL (ref 3.87–5.11)
RDW: 14 % (ref 11.5–15.5)
WBC: 16.3 10*3/uL — ABNORMAL HIGH (ref 4.0–10.5)
nRBC: 0 % (ref 0.0–0.2)

## 2019-04-27 LAB — LIPASE, BLOOD: Lipase: 20 U/L (ref 11–51)

## 2019-04-27 MED ORDER — SODIUM CHLORIDE 0.9 % IV SOLN
1.0000 g | Freq: Once | INTRAVENOUS | Status: AC
  Administered 2019-04-27: 1 g via INTRAVENOUS
  Filled 2019-04-27: qty 10

## 2019-04-27 MED ORDER — SODIUM CHLORIDE 0.9 % IV SOLN: Freq: Once | INTRAVENOUS | Status: AC

## 2019-04-27 MED ORDER — ONDANSETRON HCL 4 MG/2ML IJ SOLN
4.0000 mg | Freq: Once | INTRAMUSCULAR | Status: AC
  Administered 2019-04-27: 4 mg via INTRAVENOUS
  Filled 2019-04-27: qty 2

## 2019-04-27 NOTE — Telephone Encounter (Signed)
Spoke with pt's mom and she just wanted [name] to know that she had to take pt to the hospital. She reported that she did report that the pt still had a UTI and the ED gave her medication. She just wanted to let us know. Nothing further is needed.

## 2019-04-27 NOTE — ED Notes (Signed)
Hx of chronic UTI's.  Has seen primary MD with pending lab work.  Multiple c/o this visit.  N/V and inability to keep fluids down, difficulty in urinating.  No diarrhea.  Also reported runny nose and some blood noted.  Pt in NAD.  Pt reports pain to nose and throat.  Mom giving hx with rapid speech.

## 2019-04-27 NOTE — ED Triage Notes (Signed)
Patient's mother reported SOB with productive cough onset this week  , denies fever or chills , seen here today chest x-ray , blood tests and urine test done - discharged home .

## 2019-04-27 NOTE — Telephone Encounter (Signed)
Patient's mother called to update Dr Drue Second: - 1/18 stopped the powder packets after 2 doses, states "it's not working, and it's only for the UTI not the sinus infection that always happens with the UTI." Was advised to continue the fosfomycin as directed, to see PCP for evaluation of sinus complaints. - 1/26 went to [facility] for UTI/Sinus infection, given 14 day course Cipro (finished 2/8). - 2/5 went to [facility] PCP, states she now has pneumonia after untreated UTI/sinus infection, given levaquin - 2/9 went to [facility] ER for UTI/pneumonia, given IV ceftriaxone 1 gm once.  Has [facility] follow up 2/15, is scheduled to see Dr Logan Bores at [facility] for "preop evaluation only" per patient. Patient's  mother would like antibiotics to treat the UTI/Sinus/Pneumonia infection. Labs/xrays available. Andree Coss, RN

## 2019-04-27 NOTE — Discharge Instructions (Addendum)
Please follow-up with Dr. Logan Bores of urology.  Please continue to take Levaquin as prescribed.  Please return to ED for any new or concerning symptoms.  Please continue taking the Levaquin as this will cover any bacterial infection of the bladder however there is no evidence of infection in urine today.

## 2019-04-27 NOTE — ED Provider Notes (Signed)
[facility] EMERGENCY DEPARTMENT Provider Note   CSN: [id number] Arrival date & time: 04/27/19  2993     History Chief Complaint  Patient presents with  . Enuresis    Lori Miles is a 24 y.o. female.  HPI Patient is a 24 year old female with a history of developmental delay/autism presented today with mother at bedside.  Mother is primary historian due to patient being nonverbal at baseline.  Patient is regularly seen by urology, Infectious disease and pulmonology.   Per mother patient has had a UTI today.  Mother states that patient has wet the bed which only occurs when patient has urinary tract infection.  Mother states that Lori Miles is recently put on fosfomycin but stopped the medication on 25 January 2 to patient having a rash.  Her face.  Patient was not started on Keflex and later switched to ciprofloxacin which Lori Miles took until Thursday of this past week.  Lori Miles was then placed on Levaquin for pneumonia.  Patient's mother states that the patient has been vomiting for the past 2 weeks however on repeat questioning it appears that patient has been able to take her medications as only vomited couple times in the past 2 days.  Mother states that patient has 2 days of Levaquin left and is concerned that Lori Miles needs additional antibiotics.  Mother additionally states that patient has had some epistaxis and one-point coughed up scant bloody sputum.  Current allergies coughed occasionally the past 2 weeks but is rarely productive of any sputum.   On my review of EMR Patient is a 24 year old female with a history of low capacity overactive bladder and recurrent UTI.  I discussed with Dr. Lear Ng urologist--patient's case who states that patient does have recurrent UTIs however half of the time that Lori Miles is evaluated her urine is clean and Lori Miles is thought to be experiencing symptoms of her overactive bladder.  Lori Miles has a history of urge incontinence has had urodynamics and  Botox in the past per mother--although on review of EMR it appears that urodynamics were failed and a cystoscopy is planned to be conducted in the future.  Level 5 caveat due to baseline intellectual delay/nonverbal status     Past Medical History:  Diagnosis Date  . Asthma   . Autism    mom states no actual dx, but admits Lori Miles has some symptoms  . Bacteriuria   . Chronic constipation   . Cognitive developmental delay   . Constipation   . Disruptive behavior disorder   . Dyspnea    if Lori Miles walks much  . Family history of adverse reaction to anesthesia     "hives"-? if anesthesia did take antibiotic  . Frequency-urgency syndrome   . Gait disorder    ORGANIC PER NERUOLGIST NOTE (DR HICKLING)  . 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 GERD (gastroesophageal reflux disease)   . Gout   . H/O sinus tachycardia   . History of acute respiratory failure 02/14/2015   SECONDARY TO CAP  . History of recurrent UTIs   . Interstitial cystitis   . Moderate intellectual disability   . OSA (obstructive sleep apnea)    no machine yet   . Partial epilepsy with impairment of consciousness (HCC)    FOLLOWED BY DR HICKLING  . Pneumonia 2    4 times this year ( 2020)  . PONV (postoperative nausea and vomiting)   . Seizure (HCC)    most recent sz " at the  end of summer"-last sz years ago per mother  . Urinary incontinence     Patient Active Problem List   Diagnosis Date Noted  . Screening for cervical cancer 02/26/2019  . Glucosuria 06/01/2018  . 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 Not well controlled moderate persistent asthma 04/29/2018  . Gastroesophageal reflux disease 04/29/2018  . Recurrent infections 04/29/2018  . Dysuria 02/20/2018  . Chronic rhinitis 04/03/2017  . Recurrent falls 03/21/2017  . Weakness 03/21/2017  . Venous insufficiency 03/21/2017  . Excessive daytime sleepiness 03/05/2016  . 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 Cough  variant asthma vs UACS/ vcd  03/15/2015  . Autism spectrum disorder 02/14/2015  . Nausea with vomiting 02/14/2015  . Obesity, morbid (HCC) 12/13/2014  . 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 Acanthosis nigricans 11/14/2014  . Toeing-in 08/29/2014  . 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 Recurrent urinary tract infection 08/01/2011  . Asthma 08/01/2011  . Chronic constipation 08/01/2011  . Contraception 08/01/2011    Past Surgical History:  Procedure Laterality Date  . CYSTOSCOPY N/A 06/03/2017   Procedure: CYSTOSCOPY WITH EXAM UNDER ANESTHESIA;  Surgeon: Jerilee Field, MD;  Location: [facility];  Service: Urology;  Laterality: N/A;  . CYSTOSCOPY  2020  . CYSTOSCOPY WITH HYDRODISTENSION AND BIOPSY  04/14/2012   Procedure: CYSTOSCOPY/BIOPSY/HYDRODISTENSION;  Surgeon: Lindaann Slough, MD;  Location: [facility];  Service: Urology;;  instillation of marcaine and pyridium  . EUA/  PAP SMEAR/  BREAST EXAM  03-12-2016   dr Erin Fulling [facility]  . EUA/ VAGINOSCOPY/ COLPOSCOPY FO THE HYMENAL RING  10-04-2009    dr Tamela Oddi  [facility]  . TONSILLECTOMY       OB History   No obstetric history on file.     Family History  Problem Relation Age of Onset  . Seizures Mother   . Seizures Sister        1 sister has  . Pancreatic cancer Sister   . Colon cancer Neg Hx   . Colon polyps Neg Hx   . Esophageal cancer Neg Hx   . Rectal cancer Neg Hx   . Stomach cancer Neg Hx     Social History   Tobacco Use  . Smoking status: Never Smoker  . Smokeless tobacco: Never Used  Substance Use Topics  . Alcohol use: No    Alcohol/week: 0.0 standard drinks  . Drug use: No    Home Medications Prior to Admission medications   Medication Sig  Start Date End Date Taking? Authorizing Provider  acetaminophen (TYLENOL) 325 MG tablet Take 2 tablets (650 mg total) by mouth every 6 (six) hours as needed for mild pain or moderate pain. 02/27/17   Everlene Farrier, PA-C  albuterol (PROVENTIL) (2.5 MG/3ML) 0.083% nebulizer solution INHALE CONTENTS OF 1 VIAL IN NEBULIZER EVERY 4 TO 6 HOURS IF NEEDED FOR COUGH OR WHEEZING Patient taking differently: Take 2.5 mg by nebulization every 4 (four) hours as needed for wheezing or shortness of breath.  03/17/18   Kozlow, Alvira Philips, MD  benzonatate (TESSALON) 200 MG capsule TAKE 1 CAPSULE(200 MG) BY MOUTH THREE TIMES DAILY AS NEEDED Patient taking differently: Take 200 mg by mouth 3 (three) times daily as needed for cough.  08/26/18   Myrlene Broker, MD  budesonide (PULMICORT) 1 MG/2ML nebulizer solution Inhale  1 vial 4 times per day, during upper respiratory infections and asthma flares for 1-2 weeks 11/20/18   Ambs, Kathrine Cords, FNP  cefUROXime (CEFTIN) 500 MG tablet SMARTSIG:1 Tablet(s) By Mouth Every 12 Hours 01/04/19   [provider]  cephALEXin (KEFLEX) 500 MG capsule Take 500 mg by mouth 3 (three) times daily.    [provider]  cetirizine (ZYRTEC) 10 MG tablet TAKE 1 TABLET(10 MG) BY MOUTH DAILY 12/10/18   Kozlow, Donnamarie Poag, MD  clotrimazole-betamethasone (LOTRISONE) cream Apply to affected area 2 times daily prn Patient taking differently: Apply 1 application topically daily.  06/29/18   Robyn Haber, MD  DEPAKOTE 500 MG DR tablet TAKE 1 TABLET BY MOUTH IN THE MORNING AND 2 AT NIGHT 03/15/19   Jodi Geralds, MD  famotidine (PEPCID) 20 MG tablet Take 1 tablet (20 mg total) by mouth every 12 (twelve) hours. 01/21/19   Pyrtle, Lajuan Lines, MD  fluconazole (DIFLUCAN) 150 MG tablet Take 150 mg by mouth as needed (when on antibiotics).    [provider]  fosfomycin (MONUROL) 3 g PACK Take 3 grams by mouth  every 3 DAYS for 4 doses 03/29/19   Carlyle Basques, MD  hydrOXYzine  (ATARAX/VISTARIL) 25 MG tablet Take 25 mg by mouth 3 (three) times daily as needed (pain).  01/13/19   [provider]  levofloxacin (LEVAQUIN) 750 MG tablet Take 750 mg by mouth daily. 12/11/18   [provider]  Levonorgestrel-Ethinyl Estradiol (SEASONIQUE) 0.15-0.03 &0.01 MG tablet Take 1 tablet by mouth daily. 01/28/19   Lajean Manes, CNM  Melatonin 3 MG TABS Take 3 mg by mouth at bedtime.     [provider]  montelukast (SINGULAIR) 10 MG tablet TAKE 1 TABLET(10 MG) BY MOUTH AT BEDTIME 12/10/18   Kozlow, Donnamarie Poag, MD  Nebulizer MISC Adult mask with tubing. Use as directed with nebulizer device. 11/18/18   Kozlow, Donnamarie Poag, MD  nitrofurantoin (MACRODANTIN) 100 MG capsule Take 100 mg by mouth 2 (two) times daily. For 7 days, ordered 12/13.    [provider]  ondansetron (ZOFRAN ODT) 4 MG disintegrating tablet Take 1 tablet (4 mg total) by mouth every 8 (eight) hours as needed for nausea. Patient taking differently: Take 8 mg by mouth every 8 (eight) hours as needed for nausea.  06/07/18   Larene Pickett, PA-C  PERFOROMIST 20 MCG/2ML nebulizer solution USE 2 MLS BY NEBULIZATION TWICE DAILY 12/11/18   Kozlow, Donnamarie Poag, MD  polyethylene glycol powder (GLYCOLAX/MIRALAX) 17 GM/SCOOP powder Dissolve 17 grams (1 capful) in at least 8 ounces of water/juice and drink twice daily 02/15/19   Pyrtle, Lajuan Lines, MD  predniSONE (DELTASONE) 10 MG tablet Take 4 tabs for 1  day, then 3 tabs for 1 day, 2 tabs for 1 day, then 1 tab for day, then stop 04/26/19   Magdalen Spatz, NP  senna (SENOKOT) 8.6 MG tablet Take 1 tablet by mouth at bedtime.     [provider]  terconazole (TERAZOL 7) 0.4 % vaginal cream Place 1 applicator vaginally at bedtime. 01/28/19   Lajean Manes, CNM  traZODone (DESYREL) 50 MG tablet Take 2 tablets (100 mg total) by mouth at bedtime. 03/10/19   Rockwell Germany, NP  YUPELRI 175 MCG/3ML SOLN USE 1 VIAL IN NEBULIZER DAILY - DO NOT MIX WITH OTHER NEB  MEDS,USE BEFORE OR AFTER 02/02/19   Kozlow, Donnamarie Poag, MD    Allergies    Abilify [aripiprazole], Seroquel [quetiapine fumarate], Amoxicillin-pot  clavulanate, Bactrim [sulfamethoxazole-trimethoprim], Doxycycline, Macrobid [nitrofurantoin macrocrystal], and Keppra [levetiracetam]  Review of Systems   Review of Systems  Unable to perform ROS: Patient nonverbal    Physical Exam Updated Vital Signs BP 114/90 (BP Location: Right Arm)   Pulse (!) 104   Temp 98.1 F (36.7 C) (Oral)   Resp 18   Ht 4\' 10"  (1.473 m)   Wt 83.9 kg   SpO2 100%   BMI 38.67 kg/m   Physical Exam Vitals and nursing note reviewed.  Constitutional:      General: Lori Miles is not in acute distress.    Comments: Patient is 24 year old female will occasionally say 1 word however is not able to reliably answer questions.  Lori Miles is able to follow some commands however  HENT:     Head: Normocephalic and atraumatic.     Nose: Nose normal.     Mouth/Throat:     Mouth: Mucous membranes are moist.  Eyes:     General: No scleral icterus. Cardiovascular:     Rate and Rhythm: Regular rhythm. Tachycardia present.     Pulses: Normal pulses.     Heart sounds: Normal heart sounds. No murmur.     Comments: Heart rate mildly tachycardic at 108 Pulmonary:     Effort: Pulmonary effort is normal. No respiratory distress.     Breath sounds: Normal breath sounds. No wheezing.  Abdominal:     Palpations: Abdomen is soft.     Tenderness: There is no abdominal tenderness. There is no right CVA tenderness, left CVA tenderness or rebound.  Musculoskeletal:     Cervical back: Normal range of motion.     Right lower leg: No edema.     Left lower leg: No edema.  Skin:    General: Skin is warm and dry.     Capillary Refill: Capillary refill takes less than 2 seconds.  Neurological:     Mental Status: Lori Miles is alert. Mental status is at baseline.  Psychiatric:     Comments: Patient is currently unperturbed, appears in good spirits with  occasional smiles does not appear to be in pain is difficult to assess mood, thought content and judgment due to patient's nonverbal status.     ED Results / Procedures / Treatments   Labs (all labs ordered are listed, but only abnormal results are displayed) Labs Reviewed  CBC WITH DIFFERENTIAL/PLATELET - Abnormal; Notable for the following components:      Result Value   WBC 16.3 (*)    Neutro Abs 12.6 (*)    Abs Immature Granulocytes 0.29 (*)    All other components within normal limits  COMPREHENSIVE METABOLIC PANEL - Abnormal; Notable for the following components:   CO2 18 (*)    Glucose, Bld 169 (*)    Total Protein 6.4 (*)    Albumin 3.0 (*)    All other components within normal limits  URINALYSIS, ROUTINE W REFLEX MICROSCOPIC - Abnormal; Notable for the following components:   Glucose, UA >=500 (*)    Ketones, ur 20 (*)    All other components within normal limits  URINE CULTURE  LIPASE, BLOOD    EKG None  Radiology DG Chest 2 View  Result Date: 04/26/2019 CLINICAL DATA:  Cough and wheezing EXAM: CHEST - 2 VIEW COMPARISON:  September 09, 2018 FINDINGS: There is mild right base atelectasis. The lungs elsewhere are clear. Heart size and pulmonary vascularity are normal. No adenopathy. There is lower thoracic levoscoliosis. IMPRESSION: Mild right base atelectasis. Lungs  elsewhere clear. Cardiac silhouette normal. Electronically Signed   By: Bretta Bang III M.D.   On: 04/26/2019 07:44   DG Chest Portable 1 View  Result Date: 04/27/2019 CLINICAL DATA:  Cough and shortness of breath. Epistaxis. Vomiting. Hematuria. EXAM: PORTABLE CHEST 1 VIEW COMPARISON:  04/26/2019 FINDINGS: Poor inspiration. Grossly stable normal sized heart. Clear lungs with normal vascularity. Unremarkable bones. IMPRESSION: No acute abnormality. Electronically Signed   By: Beckie Salts M.D.   On: 04/27/2019 12:15    Procedures Procedures (including critical care time)  Medications Ordered in  ED Medications  0.9 %  sodium chloride infusion ( Intravenous Stopped 04/27/19 1400)  ondansetron (ZOFRAN) injection 4 mg (4 mg Intravenous Given 04/27/19 1128)  cefTRIAXone (ROCEPHIN) 1 g in sodium chloride 0.9 % 100 mL IVPB (0 g Intravenous Stopped 04/27/19 1350)    ED Course  I have reviewed the triage vital signs and the nursing notes.  Pertinent labs & imaging results that were available during my care of the patient were reviewed by me and considered in my medical decision making (see chart for details).    MDM Rules/Calculators/A&P                      Patient is well-appearing 24 year old female presented today with mother.  As patient is nonverbal history is collected from mother.  Notably the patient has a history of recurrent UTIs.  Mother brought her to ED today due to concern after patient went to bed.  Mother states that this is asymptomatic urinary tract infection.  Physical exam is unremarkable.  Patient had mild tachycardia however on my review of EMR it appears that Lori Miles has had elevated heart rate in the past.  I discussed this with mother Lori Miles states that Lori Miles has actually been seen by cardiologist to "not done anything "which likely indicates the patient not having underlying arrhythmia or cardiac defect.  Has patient's outpatient appointments 2 days ago and 1 month ago Lori Miles had heart rate of over 100 consistent with today's tachycardia.  Patient is currently on levofloxacin.  I discussed case with Dr. Yetta Flock which is the patient's urologist.  He recommends that if urine does not appear acutely infected to leave patient on antibiotics and have her follow-up with Dr. Marcelyn Bruins of urology.  Patient does have mild leukocytosis of 16.3, urinalysis does not appear infected, CMP is unremarkable apart from mildly elevated glucose--consistent with patient being prediabetic.  Chest x-ray independently reviewed by myself there is no sign of infiltrate, fracture, or abnormality.  Reassessed  patient-Lori Miles has successfully been p.o. challenged.  Lori Miles is given Zofran, 1 g of Rocephin and 1 L of normal saline.  Lori Miles is well-appearing and continues to appear to be in no acute distress.  Lori Miles is interactive and moves all 4 extremities and appears to be at her baseline per mother.  Lengthy shared decision making conversation with mother--we will discharge patient at this time with follow-up with urology.  Her chest x-ray does not show any signs of infection and her urine shows no signs of infection.  Lori Miles is currently on Levaquin and although the source of her elevated WBC is unclear Lori Miles continues to be well-appearing at this time with no fever or abnormal vital signs apart from her resting tachycardia which appears to be normal for her.  Lori Miles will continue to see cardiology for this.  Lori Miles was given strict return precautions mother states her understanding the plan.  Lori Miles will continue to  take levofloxacin which Lori Miles has 2 days left in her course.  I discussed this case with my attending physician who cosigned this note including patient's presenting symptoms, physical exam, and planned diagnostics and interventions. Attending physician stated agreement with plan or made changes to plan which were implemented. Attending physician assessed patient at bedside.  Krishna Heuer Sifuentes was evaluated in Emergency Department on 04/27/2019 for the symptoms described in the history of present illness. Lori Miles was evaluated in the context of the global COVID-19 pandemic, which necessitated consideration that the patient might be at risk for infection with the SARS-CoV-2 virus that causes COVID-19. Institutional protocols and algorithms that pertain to the evaluation of patients at risk for COVID-19 are in a state of rapid change based on information released by regulatory bodies including the CDC and federal and state organizations. These policies and algorithms were followed during the patient's care in the ED.   Final  Clinical Impression(s) / ED Diagnoses Final diagnoses:  Urinary incontinence, unspecified type    Rx / DC Orders ED Discharge Orders    None       Gailen Shelter, [place] 04/27/19 2203    Margarita Grizzle, MD 05/03/19 1440

## 2019-04-28 ENCOUNTER — Emergency Department (HOSPITAL_COMMUNITY): Payer: Medicare Other

## 2019-04-28 DIAGNOSIS — R32 Unspecified urinary incontinence: Secondary | ICD-10-CM | POA: Diagnosis not present

## 2019-04-28 LAB — URINE CULTURE: Culture: <10000 — AB

## 2019-04-28 MED ORDER — PREDNISONE 20 MG PO TABS: ORAL_TABLET | ORAL | 0 refills | Status: DC

## 2019-04-28 MED ORDER — PROMETHAZINE HCL 25 MG RE SUPP: 25.0000 mg | Freq: Four times a day (QID) | RECTAL | 0 refills | Status: DC | PRN

## 2019-04-28 MED ORDER — PROMETHAZINE HCL 25 MG PO TABS: 25.0000 mg | ORAL_TABLET | Freq: Four times a day (QID) | ORAL | 0 refills | Status: DC | PRN

## 2019-04-28 NOTE — ED Provider Notes (Signed)
[facility] EMERGENCY DEPARTMENT Provider Note   CSN: [id number] Arrival date & time: 04/27/19  2110     History Chief Complaint  Patient presents with  . Shortness of Breath    Lori Miles is a 24 y.o. female.  24 yo F with a cc of sob.  Is been going on for the past few days.  She was seen in the ED yesterday for the same.  Had a significant work-up.  After she was discharged mom called the [facility] walk-in clinic and they told her that she needed to get a steroid prescription and to come immediately back to the hospital for evaluation.  Patient has no complaints on direct questioning.  Level 5 caveat intellectual disability  The history is provided by the patient.  Shortness of Breath Severity:  Moderate Onset quality:  Gradual Duration:  2 days Timing:  Constant Progression:  Worsening Chronicity:  New Relieved by:  Nothing Worsened by:  Nothing Ineffective treatments:  None tried      Past Medical History:  Diagnosis Date  . Asthma   . Autism    mom states no actual dx, but admits she has some symptoms  . Bacteriuria   . Chronic constipation   . Cognitive developmental delay   . Constipation   . Disruptive behavior disorder   . Dyspnea    if she walks much  . Family history of adverse reaction to anesthesia     "hives"-? if anesthesia did take antibiotic  . Frequency-urgency syndrome   . Gait disorder    ORGANIC PER NERUOLGIST NOTE (DR HICKLING)  . 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 GERD (gastroesophageal reflux disease)   . Gout   . H/O sinus tachycardia   . History of acute respiratory failure 02/14/2015   SECONDARY TO CAP  . History of recurrent UTIs   . Interstitial cystitis   . Moderate intellectual disability   . OSA (obstructive sleep apnea)    no machine yet   . Partial epilepsy with impairment of consciousness (HCC)    FOLLOWED BY DR HICKLING  . Pneumonia 2    4 times this year ( 2020)   . PONV (postoperative nausea and vomiting)   . Seizure (HCC)    most recent sz " at the end of summer"-last sz years ago per mother  . Urinary incontinence     Patient Active Problem List   Diagnosis Date Noted  . Screening for cervical cancer 02/26/2019  . Glucosuria 06/01/2018  . 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 Not well controlled moderate persistent asthma 04/29/2018  . Gastroesophageal reflux disease 04/29/2018  . Recurrent infections 04/29/2018  . Dysuria 02/20/2018  . Chronic rhinitis 04/03/2017  . Recurrent falls 03/21/2017  . Weakness 03/21/2017  . Venous insufficiency 03/21/2017  . Excessive daytime sleepiness 03/05/2016  . 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 Cough variant asthma vs UACS/ vcd  03/15/2015  . Autism spectrum disorder 02/14/2015  . Nausea with vomiting 02/14/2015  . Obesity, morbid (HCC) 12/13/2014  . 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 Acanthosis nigricans 11/14/2014  . Toeing-in 08/29/2014  . 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 Recurrent urinary tract infection 08/01/2011  . Asthma 08/01/2011  . Chronic constipation 08/01/2011  . Contraception 08/01/2011  Past Surgical History:  Procedure Laterality Date  . CYSTOSCOPY N/A 06/03/2017   Procedure: CYSTOSCOPY WITH EXAM UNDER ANESTHESIA;  Surgeon: Jerilee Field, MD;  Location: [facility];  Service: Urology;  Laterality: N/A;  . CYSTOSCOPY  2020  . CYSTOSCOPY WITH HYDRODISTENSION AND BIOPSY  04/14/2012   Procedure: CYSTOSCOPY/BIOPSY/HYDRODISTENSION;  Surgeon: Lindaann Slough, MD;  Location: [facility] [place];  Service: Urology;;  instillation of marcaine and pyridium  . EUA/  PAP SMEAR/  BREAST EXAM  03-12-2016   dr  Erin Fulling [facility]  . EUA/ VAGINOSCOPY/ COLPOSCOPY FO THE HYMENAL RING  10-04-2009    dr Tamela Oddi  [facility]  . TONSILLECTOMY       OB History   No obstetric history on file.     Family History  Problem Relation Age of Onset  . Seizures Mother   . Seizures Sister        1 sister has  . Pancreatic cancer Sister   . Colon cancer Neg Hx   . Colon polyps Neg Hx   . Esophageal cancer Neg Hx   . Rectal cancer Neg Hx   . Stomach cancer Neg Hx     Social History   Tobacco Use  . Smoking status: Never Smoker  . Smokeless tobacco: Never Used  Substance Use Topics  . Alcohol use: No    Alcohol/week: 0.0 standard drinks  . Drug use: No    Home Medications Prior to Admission medications   Medication Sig Start Date End Date Taking? Authorizing Provider  acetaminophen (TYLENOL) 325 MG tablet Take 2 tablets (650 mg total) by mouth every 6 (six) hours as needed for mild pain or moderate pain. 02/27/17   Everlene Farrier, PA-C  albuterol (PROVENTIL) (2.5 MG/3ML) 0.083% nebulizer solution INHALE CONTENTS OF 1 VIAL IN NEBULIZER EVERY 4 TO 6 HOURS IF NEEDED FOR COUGH OR WHEEZING Patient taking differently: Take 2.5 mg by nebulization every 4 (four) hours as needed for wheezing or shortness of breath.  03/17/18   Kozlow, Alvira Philips, MD  benzonatate (TESSALON) 200 MG capsule TAKE 1 CAPSULE(200 MG) BY MOUTH THREE TIMES DAILY AS NEEDED Patient taking differently: Take 200 mg by mouth 3 (three) times daily as needed for cough.  08/26/18   Myrlene Broker, MD  budesonide (PULMICORT) 1 MG/2ML nebulizer solution Inhale 1 vial 4 times per day, during upper respiratory infections and asthma flares for 1-2 weeks 11/20/18   Ambs, Norvel Richards, FNP  cefUROXime (CEFTIN) 500 MG tablet SMARTSIG:1 Tablet(s) By Mouth Every 12 Hours 01/04/19   [provider]  cephALEXin (KEFLEX) 500 MG capsule Take 500 mg by mouth 3 (three) times daily.    [provider]  cetirizine (ZYRTEC) 10 MG tablet TAKE 1  TABLET(10 MG) BY MOUTH DAILY 12/10/18   Kozlow, Alvira Philips, MD  clotrimazole-betamethasone (LOTRISONE) cream Apply to affected area 2 times daily prn Patient taking differently: Apply 1 application topically daily.  06/29/18   Elvina Sidle, MD  DEPAKOTE 500 MG DR tablet TAKE 1 TABLET BY MOUTH IN THE MORNING AND 2 AT NIGHT 03/15/19   Deetta Perla, MD  famotidine (PEPCID) 20 MG tablet Take 1 tablet (20 mg total) by mouth every 12 (twelve) hours. 01/21/19   Pyrtle, Carie Caddy, MD  fluconazole (DIFLUCAN) 150 MG tablet Take 150 mg by mouth as needed (when on antibiotics).    [provider]  fosfomycin (MONUROL) 3 g PACK Take 3 grams by mouth  every 3 DAYS for 4 doses 03/29/19  Judyann Munson, MD  hydrOXYzine (ATARAX/VISTARIL) 25 MG tablet Take 25 mg by mouth 3 (three) times daily as needed (pain).  01/13/19   [provider]  levofloxacin (LEVAQUIN) 750 MG tablet Take 750 mg by mouth daily. 12/11/18   [provider]  Levonorgestrel-Ethinyl Estradiol (SEASONIQUE) 0.15-0.03 &0.01 MG tablet Take 1 tablet by mouth daily. 01/28/19   Sharyon Cable, CNM  Melatonin 3 MG TABS Take 3 mg by mouth at bedtime.     [provider]  montelukast (SINGULAIR) 10 MG tablet TAKE 1 TABLET(10 MG) BY MOUTH AT BEDTIME 12/10/18   Kozlow, Alvira Philips, MD  Nebulizer MISC Adult mask with tubing. Use as directed with nebulizer device. 11/18/18   Kozlow, Alvira Philips, MD  nitrofurantoin (MACRODANTIN) 100 MG capsule Take 100 mg by mouth 2 (two) times daily. For 7 days, ordered 12/13.    [provider]  ondansetron (ZOFRAN ODT) 4 MG disintegrating tablet Take 1 tablet (4 mg total) by mouth every 8 (eight) hours as needed for nausea. Patient taking differently: Take 8 mg by mouth every 8 (eight) hours as needed for nausea.  06/07/18   Garlon Hatchet, PA-C  PERFOROMIST 20 MCG/2ML nebulizer solution USE 2 MLS BY NEBULIZATION TWICE DAILY 12/11/18   Kozlow, Alvira Philips, MD  polyethylene glycol powder  (GLYCOLAX/MIRALAX) 17 GM/SCOOP powder Dissolve 17 grams (1 capful) in at least 8 ounces of water/juice and drink twice daily 02/15/19   Pyrtle, Carie Caddy, MD  predniSONE (DELTASONE) 10 MG tablet Take 4 tabs for 1  day, then 3 tabs for 1 day, 2 tabs for 1 day, then 1 tab for day, then stop 04/26/19   Bevelyn Ngo, NP  predniSONE (DELTASONE) 20 MG tablet 2 tabs po daily x 4 days 04/28/19   Melene Plan, DO  promethazine (PHENERGAN) 25 MG suppository Place 1 suppository (25 mg total) rectally every 6 (six) hours as needed for nausea or vomiting. 04/28/19   Melene Plan, DO  promethazine (PHENERGAN) 25 MG tablet Take 1 tablet (25 mg total) by mouth every 6 (six) hours as needed for nausea or vomiting. 04/28/19   Melene Plan, DO  senna (SENOKOT) 8.6 MG tablet Take 1 tablet by mouth at bedtime.     [provider]  terconazole (TERAZOL 7) 0.4 % vaginal cream Place 1 applicator vaginally at bedtime. 01/28/19   Sharyon Cable, CNM  traZODone (DESYREL) 50 MG tablet Take 2 tablets (100 mg total) by mouth at bedtime. 03/10/19   Elveria Rising, NP  YUPELRI 175 MCG/3ML SOLN USE 1 VIAL IN NEBULIZER DAILY - DO NOT MIX WITH OTHER NEB MEDS,USE BEFORE OR AFTER 02/02/19   Kozlow, Alvira Philips, MD    Allergies    Abilify [aripiprazole], Seroquel [quetiapine fumarate], Amoxicillin-pot clavulanate, Bactrim [sulfamethoxazole-trimethoprim], Doxycycline, Macrobid [nitrofurantoin macrocrystal], and Keppra [levetiracetam]  Review of Systems   Review of Systems  Unable to perform ROS: Other (Intellectual disability)  Respiratory: Positive for shortness of breath.     Physical Exam Updated Vital Signs BP (!) 109/97   Pulse 86   Temp 98.4 F (36.9 C) (Oral)   Resp 20   SpO2 97%   Physical Exam Vitals and nursing note reviewed.  Constitutional:      General: She is not in acute distress.    Appearance: She is well-developed. She is not diaphoretic.  HENT:     Head: Normocephalic and atraumatic.  Eyes:      Pupils: Pupils are equal, round, and reactive to light.  Cardiovascular:     Rate and Rhythm: Normal rate and regular rhythm.     Heart sounds: No murmur. No friction rub. No gallop.   Pulmonary:     Effort: Pulmonary effort is normal.     Breath sounds: No wheezing or rales.  Abdominal:     General: There is no distension.     Palpations: Abdomen is soft.     Tenderness: There is no abdominal tenderness.  Musculoskeletal:        General: No tenderness.     Cervical back: Normal range of motion and neck supple.  Skin:    General: Skin is warm and dry.  Neurological:     Mental Status: She is alert and oriented to person, place, and time.  Psychiatric:        Behavior: Behavior normal.     ED Results / Procedures / Treatments   Labs (all labs ordered are listed, but only abnormal results are displayed) Labs Reviewed - No data to display  EKG EKG Interpretation  Date/Time:  Tuesday April 27 2019 21:40:27 EST Ventricular Rate:  92 PR Interval:  144 QRS Duration: 68 QT Interval:  312 QTC Calculation: 385 R Axis:   93 Text Interpretation: Normal sinus rhythm Rightward axis ST-t wave abnormality No significant change since last tracing Abnormal ECG Confirmed by Carmin Muskrat [phone]) on 04/27/2019 9:54:59 PM   Radiology DG Chest 2 View  Result Date: 04/28/2019 CLINICAL DATA:  Shortness of breath, productive cough began this week EXAM: CHEST - 2 VIEW COMPARISON:  Radiograph 04/27/2019 FINDINGS: Slightly low volumes with some basilar atelectasis. Lungs are otherwise clear. No consolidation, features of edema, pneumothorax, or effusion. The cardiomediastinal contours are unremarkable. No acute osseous or soft tissue abnormality. IMPRESSION: Low volumes and atelectasis, otherwise no acute cardiopulmonary disease. Electronically Signed   By: Lovena Le M.D.   On: 04/28/2019 03:19   DG Chest 2 View  Result Date: 04/26/2019 CLINICAL DATA:  Cough and wheezing EXAM: CHEST - 2 VIEW  COMPARISON:  September 09, 2018 FINDINGS: There is mild right base atelectasis. The lungs elsewhere are clear. Heart size and pulmonary vascularity are normal. No adenopathy. There is lower thoracic levoscoliosis. IMPRESSION: Mild right base atelectasis. Lungs elsewhere clear. Cardiac silhouette normal. Electronically Signed   By: Lowella Grip III M.D.   On: 04/26/2019 07:44   DG Chest Portable 1 View  Result Date: 04/27/2019 CLINICAL DATA:  Cough and shortness of breath. Epistaxis. Vomiting. Hematuria. EXAM: PORTABLE CHEST 1 VIEW COMPARISON:  04/26/2019 FINDINGS: Poor inspiration. Grossly stable normal sized heart. Clear lungs with normal vascularity. Unremarkable bones. IMPRESSION: No acute abnormality. Electronically Signed   By: Claudie Revering M.D.   On: 04/27/2019 12:15    Procedures Procedures (including critical care time)  Medications Ordered in ED Medications - No data to display  ED Course  I have reviewed the triage vital signs and the nursing notes.  Pertinent labs & imaging results that were available during my care of the patient were reviewed by me and considered in my medical decision making (see chart for details).    MDM Rules/Calculators/A&P                      24 yo F with a cc of shortness of breath.  Patient has no shortness of breath on my exam.  She is not tachypneic she is not hypoxic.  She has clear lung sounds for me.  Repeat chest x-ray today with  no change.  She was just seen here yesterday and had a significant work-up.  Do not feel that this needs to be repeated.  She unfortunately has a prolonged history of recurrent antibiotic use at mother's request and is currently being evaluated by infectious disease as well as urology for this.  She is on her last 2 doses of Levaquin.  I do not feel that I need to change antibiotics at this time.  Mom is requesting a prednisone prescription and something else for nausea.  We will have her follow-up with her doctor in the  office.  4:18 AM:  I have discussed the diagnosis/risks/treatment options with the patient and caregiver and believe the pt to be eligible for discharge home to follow-up with PCP, urology, ID. We also discussed returning to the ED immediately if new or worsening sx occur. We discussed the sx which are most concerning (e.g., sudden worsening pain, fever, inability to tolerate by mouth) that necessitate immediate return. Medications administered to the patient during their visit and any new prescriptions provided to the patient are listed below.  Medications given during this visit Medications - No data to display   The patient appears reasonably screen and/or stabilized for discharge and I doubt any other medical condition or other [facility] requiring further screening, evaluation, or treatment in the ED at this time prior to discharge.   Final Clinical Impression(s) / ED Diagnoses Final diagnoses:  Shortness of breath    Rx / DC Orders ED Discharge Orders         Ordered    predniSONE (DELTASONE) 20 MG tablet     04/28/19 0345    promethazine (PHENERGAN) 25 MG suppository  Every 6 hours PRN     04/28/19 0345    promethazine (PHENERGAN) 25 MG tablet  Every 6 hours PRN     04/28/19 0345           Melene Plan, DO 04/28/19 [phone]

## 2019-04-28 NOTE — Discharge Instructions (Signed)
Follow-up with your infectious disease doctor and urologist.  Please return for worsening trouble breathing.

## 2019-04-30 ENCOUNTER — Telehealth (HOSPITAL_COMMUNITY): Payer: Self-pay

## 2019-05-03 ENCOUNTER — Ambulatory Visit (INDEPENDENT_AMBULATORY_CARE_PROVIDER_SITE_OTHER): Payer: Medicare Other | Admitting: Internal Medicine

## 2019-05-03 ENCOUNTER — Encounter: Payer: Self-pay | Admitting: Internal Medicine

## 2019-05-03 ENCOUNTER — Other Ambulatory Visit: Payer: Self-pay

## 2019-05-03 VITALS — BP 96/64 | HR 106

## 2019-05-03 DIAGNOSIS — N390 Urinary tract infection, site not specified: Secondary | ICD-10-CM | POA: Diagnosis not present

## 2019-05-03 NOTE — Progress Notes (Signed)
RFV: follow up for complicated uti  Patient ID: Lori Miles, female   DOB: 1995/10/29, 24 y.o.   MRN: [id number]  HPI Lori Miles is a 24yo F with multiple medical issues, cognitive impairment, epilepsy, with hx of recurrent uti, and sinusitis.  Since we last saw her, it appears that she did not take 3 doses of fosfomycin (due ot hx of ESBL GNR UTI), mom reports that it was not working after 2 doses and held 3rd dose. She states that she has had numerous ED visits, for sob and tachycardia, treated with pneumonia, as well as uti. Her labs from 2/9 and urine cx does not suggest uti, cxr also appears clear. She was given another course of cephalexin 2 days ago.  She has been on 2 courses of FQ, and now on her second dose of cephalexin. She has < 100,000 kleb on urine cx from [facility] practice.  Azadeh appears non-toxic, pleasant, says thank you. Not tugging on any of her clothing  Her mom is fixated on the concept of her daughter having combined sinusitis-urinary tract infection concurrent infection. She also says that her daughter must avoid certain foods since that causes uti. She notices that when Lori Miles has epistaxis that she is also having blood in urine. I asked whether it maybe her menses noted but mother states it is not. She is being evaluated by urology, has upcoming procedure for bladder.  Outpatient Encounter Medications as of 05/03/2019  Medication Sig  . cephALEXin (KEFLEX) 500 MG capsule Take 500 mg by mouth 3 (three) times daily.  . predniSONE (DELTASONE) 10 MG tablet Take 4 tabs for 1  day, then 3 tabs for 1 day, 2 tabs for 1 day, then 1 tab for day, then stop  . predniSONE (DELTASONE) 20 MG tablet 2 tabs po daily x 4 days  . acetaminophen (TYLENOL) 325 MG tablet Take 2 tablets (650 mg total) by mouth every 6 (six) hours as needed for mild pain or moderate pain.  Marland Kitchen albuterol (PROVENTIL) (2.5 MG/3ML) 0.083% nebulizer solution INHALE CONTENTS OF 1 VIAL IN NEBULIZER EVERY 4 TO 6  HOURS IF NEEDED FOR COUGH OR WHEEZING (Patient taking differently: Take 2.5 mg by nebulization every 4 (four) hours as needed for wheezing or shortness of breath. )  . benzonatate (TESSALON) 200 MG capsule TAKE 1 CAPSULE(200 MG) BY MOUTH THREE TIMES DAILY AS NEEDED (Patient taking differently: Take 200 mg by mouth 3 (three) times daily as needed for cough. )  . budesonide (PULMICORT) 1 MG/2ML nebulizer solution Inhale 1 vial 4 times per day, during upper respiratory infections and asthma flares for 1-2 weeks  . cefUROXime (CEFTIN) 500 MG tablet SMARTSIG:1 Tablet(s) By Mouth Every 12 Hours  . cetirizine (ZYRTEC) 10 MG tablet TAKE 1 TABLET(10 MG) BY MOUTH DAILY  . ciprofloxacin (CIPRO) 250 MG tablet Take 250 mg by mouth 2 (two) times daily.  . clotrimazole-betamethasone (LOTRISONE) cream Apply to affected area 2 times daily prn (Patient taking differently: Apply 1 application topically daily. )  . DEPAKOTE 500 MG DR tablet TAKE 1 TABLET BY MOUTH IN THE MORNING AND 2 AT NIGHT  . erythromycin ophthalmic ointment APPLY INTO LOWER EYELID OF AFFECTED EYE 4 TIMES DAILY FOR 5 DAYS  . famotidine (PEPCID) 20 MG tablet Take 1 tablet (20 mg total) by mouth every 12 (twelve) hours.  . fluconazole (DIFLUCAN) 150 MG tablet Take 150 mg by mouth as needed (when on antibiotics).  . fosfomycin (MONUROL) 3 g PACK Take 3 grams  by mouth  every 3 DAYS for 4 doses  . hydrOXYzine (ATARAX/VISTARIL) 25 MG tablet Take 25 mg by mouth 3 (three) times daily as needed (pain).   Marland Kitchen levofloxacin (LEVAQUIN) 750 MG tablet Take 750 mg by mouth daily.  . Levonorgestrel-Ethinyl Estradiol (SEASONIQUE) 0.15-0.03 &0.01 MG tablet Take 1 tablet by mouth daily.  . Melatonin 3 MG TABS Take 3 mg by mouth at bedtime.   . montelukast (SINGULAIR) 10 MG tablet TAKE 1 TABLET(10 MG) BY MOUTH AT BEDTIME  . Nebulizer MISC Adult mask with tubing. Use as directed with nebulizer device.  . nitrofurantoin (MACRODANTIN) 100 MG capsule Take 100 mg by mouth 2  (two) times daily. For 7 days, ordered 12/13.  Marland Kitchen nitrofurantoin, macrocrystal-monohydrate, (MACROBID) 100 MG capsule Take 100 mg by mouth 2 (two) times daily.  . ondansetron (ZOFRAN ODT) 4 MG disintegrating tablet Take 1 tablet (4 mg total) by mouth every 8 (eight) hours as needed for nausea. (Patient taking differently: Take 8 mg by mouth every 8 (eight) hours as needed for nausea. )  . PERFOROMIST 20 MCG/2ML nebulizer solution USE 2 MLS BY NEBULIZATION TWICE DAILY  . polyethylene glycol powder (GLYCOLAX/MIRALAX) 17 GM/SCOOP powder Dissolve 17 grams (1 capful) in at least 8 ounces of water/juice and drink twice daily  . promethazine (PHENERGAN) 25 MG suppository Place 1 suppository (25 mg total) rectally every 6 (six) hours as needed for nausea or vomiting.  . promethazine (PHENERGAN) 25 MG tablet Take 1 tablet (25 mg total) by mouth every 6 (six) hours as needed for nausea or vomiting.  . senna (SENOKOT) 8.6 MG tablet Take 1 tablet by mouth at bedtime.   Marland Kitchen terconazole (TERAZOL 7) 0.4 % vaginal cream Place 1 applicator vaginally at bedtime.  . traZODone (DESYREL) 50 MG tablet Take 2 tablets (100 mg total) by mouth at bedtime.  Marland Kitchen trimethoprim-polymyxin b (POLYTRIM) ophthalmic solution   . YUPELRI 175 MCG/3ML SOLN USE 1 VIAL IN NEBULIZER DAILY - DO NOT MIX WITH OTHER NEB MEDS,USE BEFORE OR AFTER   No facility-administered encounter medications on file as of 05/03/2019.     Patient Active Problem List   Diagnosis Date Noted  . Screening for cervical cancer 02/26/2019  . Glucosuria 06/01/2018  . 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 Not well controlled moderate persistent asthma 04/29/2018  . Gastroesophageal reflux disease 04/29/2018  . Recurrent infections 04/29/2018  . Dysuria 02/20/2018  . Chronic rhinitis 04/03/2017  . Recurrent falls 03/21/2017  . Weakness 03/21/2017  . Venous insufficiency 03/21/2017  . Excessive daytime sleepiness 03/05/2016  . Disruptive behavior disorder  03/22/2015  . Cough variant asthma vs UACS/ vcd  03/15/2015  . Autism spectrum disorder 02/14/2015  . Nausea with vomiting 02/14/2015  . Obesity, morbid (HCC) 12/13/2014  . 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 Acanthosis nigricans 11/14/2014  . Toeing-in 08/29/2014  . 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 Recurrent urinary tract infection 08/01/2011  . Asthma 08/01/2011  . Chronic constipation 08/01/2011  . Contraception 08/01/2011     Health Maintenance Due  Topic Date Due  . HIV Screening  10/04/2009  . INFLUENZA VACCINE  10/17/2018    Sochx: no smoking or drinking Review of Systems Unable to obtain due to cognitive impairment. Denies pain Physical Exam   BP 96/64   Pulse (!) 106  gen = a x o by 1 in nad, in wheelchair Heent= PERRLA, EOMI, no signs of thrush Pulm= CTAB no w/c/r Abd= nontender Ext = trace edema CBC Lab Results  Component Value Date   WBC 16.3 (H) 04/27/2019   RBC 4.19 04/27/2019   HGB 12.5 04/27/2019   HCT 38.6 04/27/2019   PLT 257 04/27/2019   MCV 92.1 04/27/2019   MCH 29.8 04/27/2019   MCHC 32.4 04/27/2019   RDW 14.0 04/27/2019   LYMPHSABS 2.3 04/27/2019   MONOABS 1.0 04/27/2019   EOSABS 0.0 04/27/2019    BMET Lab Results  Component Value Date   NA 137 04/27/2019   K 4.1 04/27/2019   CL 106 04/27/2019   CO2 18 (L) 04/27/2019   GLUCOSE 169 (H) 04/27/2019   BUN 13 04/27/2019   CREATININE 0.99 04/27/2019   CALCIUM 8.9 04/27/2019   GFRNONAA >60 04/27/2019   GFRAA >60 04/27/2019      Assessment and Plan  Despite discussing that more antibiotics, places Lori Miles at further risk for MDRO, her mother states that she needs to be on an antibiotic continuously.  I will recommend to her pcp to be judicious  with abtx prescription. Would always get UA with reflex urine cx to see if need to treat when she is symptomatic. If low level CFU<100,000; then consider not treating  Very difficult case due to low health literacy, and her daughter's cognitive impairment and unable to articulate her feelings. Also need to figure out how to have her understand the concept that every sinusitis and positive urine culture does not need to be treated.   She has upcoming urology appointment that she should keep to see if need further investigation for hematuria.  Offered her second opinion within the practice if she would like.  Spent 35 min with patient's mother discussing that she does not need abtx at this time.

## 2019-05-04 ENCOUNTER — Telehealth: Payer: Self-pay | Admitting: Acute Care

## 2019-05-04 NOTE — Telephone Encounter (Signed)
Spoke with pt's mother, [name]. She is wanting to know how high does the pt's incentive spirometer should go. [name] thinks they were told 100.  Maralyn Sago - can you please clarify? Thanks.

## 2019-05-05 ENCOUNTER — Encounter: Payer: Self-pay | Admitting: Acute Care

## 2019-05-05 ENCOUNTER — Telehealth: Payer: Self-pay | Admitting: Acute Care

## 2019-05-05 NOTE — Assessment & Plan Note (Signed)
I have asked the patient's mother to have the patient's PCP manage this as they have prescribed antibiotics and are culturing urine

## 2019-05-05 NOTE — Telephone Encounter (Signed)
Have her set it to 1000 as her first goal. When she can do that easily, then 1200. Thanks

## 2019-05-05 NOTE — Telephone Encounter (Signed)
Spoke with pt's mother, [name]. She had a few questions about the incentive spirometer. I answered her questions to the best of my ability. Nothing further was needed.

## 2019-05-05 NOTE — Assessment & Plan Note (Signed)
Currently on CPAP therapy Plan Follow up with managing MD. Wear every night for a minimal of 4 hours.

## 2019-05-05 NOTE — Assessment & Plan Note (Addendum)
Pt seen for an acute visit for " breathing just not right" per mother. Pt was in no distress upon exam. She was afebrile with a sat of 97% and RR of 16 Plan Continue Pulmicort Nebs and Performist nebs as you have been doing Continue Zyrtec daily as you have been doing Continue Singulair daily Continue Yupelri nebs as you have been doing Continue nasal spray.  We will order an incentive spirometer We will check a d dimer today. We will check some other labs.( PT, INR, PTT) We will do a short prednisone taper Prednisone taper; 10 mg tablets: 4 tabs x 1 days, 3 tabs x 1 days, 2 tabs x 1 days 1 tab x 1 days then stop. Continue the [employer] as you have been doing If you have bleeding that does not stop, please seek emergency care.   Please contact office for sooner follow up if symptoms do not improve or worsen or seek emergency care

## 2019-05-05 NOTE — Assessment & Plan Note (Signed)
Pt. Is taking zofran  Plan Please continue to push fluids . I have asked the patient's mother to continue to manage this issue. Seek emergency care if condition worsens, or you notice a drop in urination

## 2019-05-05 NOTE — Telephone Encounter (Signed)
Spoke with pt's mother, [name]. She is aware of Sarah's response. Nothing further was needed.

## 2019-05-06 ENCOUNTER — Other Ambulatory Visit: Payer: Self-pay | Admitting: Allergy and Immunology

## 2019-05-06 NOTE — Progress Notes (Signed)
Reviewed and agree with assessment/plan.   Malya Cirillo, MD [facility] 03/13/2016, 12:24 PM Pager:  [phone]  

## 2019-05-09 ENCOUNTER — Emergency Department (HOSPITAL_COMMUNITY)
Admission: EM | Admit: 2019-05-09 | Discharge: 2019-05-09 | Disposition: A | Discharge status: Home / Self Care | Payer: Medicare Other | Attending: Emergency Medicine | Admitting: Emergency Medicine

## 2019-05-09 ENCOUNTER — Encounter (HOSPITAL_COMMUNITY): Payer: Self-pay

## 2019-05-09 ENCOUNTER — Other Ambulatory Visit: Payer: Self-pay

## 2019-05-09 ENCOUNTER — Emergency Department (HOSPITAL_COMMUNITY)
Admission: EM | Admit: 2019-05-09 | Discharge: 2019-05-09 | Disposition: A | Discharge status: Home / Self Care | Payer: Medicare Other | Source: Home / Self Care | Attending: Emergency Medicine | Admitting: Emergency Medicine

## 2019-05-09 ENCOUNTER — Emergency Department (HOSPITAL_COMMUNITY): Payer: Medicare Other

## 2019-05-09 DIAGNOSIS — R1110 Vomiting, unspecified: Secondary | ICD-10-CM | POA: Insufficient documentation

## 2019-05-09 DIAGNOSIS — F919 Conduct disorder, unspecified: Secondary | ICD-10-CM | POA: Insufficient documentation

## 2019-05-09 DIAGNOSIS — Z87440 Personal history of urinary (tract) infections: Secondary | ICD-10-CM | POA: Insufficient documentation

## 2019-05-09 DIAGNOSIS — F840 Autistic disorder: Secondary | ICD-10-CM | POA: Insufficient documentation

## 2019-05-09 DIAGNOSIS — Z8701 Personal history of pneumonia (recurrent): Secondary | ICD-10-CM | POA: Diagnosis not present

## 2019-05-09 DIAGNOSIS — R040 Epistaxis: Secondary | ICD-10-CM

## 2019-05-09 DIAGNOSIS — J45909 Unspecified asthma, uncomplicated: Secondary | ICD-10-CM | POA: Insufficient documentation

## 2019-05-09 DIAGNOSIS — F88 Other disorders of psychological development: Secondary | ICD-10-CM | POA: Insufficient documentation

## 2019-05-09 DIAGNOSIS — R062 Wheezing: Secondary | ICD-10-CM

## 2019-05-09 DIAGNOSIS — G40909 Epilepsy, unspecified, not intractable, without status epilepticus: Secondary | ICD-10-CM | POA: Diagnosis not present

## 2019-05-09 DIAGNOSIS — Z79899 Other long term (current) drug therapy: Secondary | ICD-10-CM | POA: Insufficient documentation

## 2019-05-09 LAB — CBC
HCT: 36.9 % (ref 36.0–46.0)
Hemoglobin: 11.9 g/dL — ABNORMAL LOW (ref 12.0–15.0)
MCH: 29.8 pg (ref 26.0–34.0)
MCHC: 32.2 g/dL (ref 30.0–36.0)
MCV: 92.5 fL (ref 80.0–100.0)
Platelets: 224 10*3/uL (ref 150–400)
RBC: 3.99 MIL/uL (ref 3.87–5.11)
RDW: 14.5 % (ref 11.5–15.5)
WBC: 15.5 10*3/uL — ABNORMAL HIGH (ref 4.0–10.5)
nRBC: 0 % (ref 0.0–0.2)

## 2019-05-09 LAB — COMPREHENSIVE METABOLIC PANEL
ALT: 16 U/L (ref 0–44)
AST: 17 U/L (ref 15–41)
Albumin: 2.6 g/dL — ABNORMAL LOW (ref 3.5–5.0)
Alkaline Phosphatase: 39 U/L (ref 38–126)
Anion gap: 10 (ref 5–15)
BUN: 9 mg/dL (ref 6–20)
CO2: 23 mmol/L (ref 22–32)
Calcium: 8.5 mg/dL — ABNORMAL LOW (ref 8.9–10.3)
Chloride: 106 mmol/L (ref 98–111)
Creatinine, Ser: 0.76 mg/dL (ref 0.44–1.00)
GFR calc Af Amer: >60 mL/min (ref >60)
GFR calc non Af Amer: >60 mL/min (ref >60)
Glucose, Bld: 102 mg/dL — ABNORMAL HIGH (ref 70–99)
Potassium: 4.1 mmol/L (ref 3.5–5.1)
Sodium: 139 mmol/L (ref 135–145)
Total Bilirubin: 0.4 mg/dL (ref 0.3–1.2)
Total Protein: 5.8 g/dL — ABNORMAL LOW (ref 6.5–8.1)

## 2019-05-09 LAB — LIPASE, BLOOD: Lipase: 24 U/L (ref 11–51)

## 2019-05-09 MED ORDER — SODIUM CHLORIDE 0.9% FLUSH: 3.0000 mL | Freq: Once | INTRAVENOUS | Status: DC

## 2019-05-09 NOTE — ED Triage Notes (Signed)
Patients mother reports that patient has had ongoing emesis with cough since early February. On arrival patient appears content and no cough nor emesis noted. Patient at baseline

## 2019-05-09 NOTE — Discharge Instructions (Addendum)
You have low blood and urine cultures that are pending.  Your urologist should follow up with the culture of the urine if it shows infection.  You will be notified if the blood culture shows infection.  Follow-up with your doctors

## 2019-05-09 NOTE — ED Provider Notes (Signed)
[facility]-EMERGENCY DEPT Provider Note   CSN: [id number] Arrival date & time: 05/09/19  1801     History Chief Complaint  Patient presents with  . Hemoptysis  . palpitaions    Lori Miles is a 23 y.o. female.  HPI Patient brought in by her mother.  Had been seen immediately prior to this at [facility].  Patient's mother states that they did not do there what she thought they needed to do and states she was told by the staff of the ER there to come to [place] long to be evaluated. Patient reportedly has had seizures.  History of seizures and may be coming more frequently.  Also has had some nosebleeds and occasional cough.  Also has had questionable urinary tract infections.  States that she was seen in the ER on the ninth.  Reviewed records she had a urine culture did not show infection at time.  Followed up with her urologist at that time they are worried that she may be colonized or they may be selecting out more dangerous organisms.  Also question of another urine culture since then that may have shown Klebsiella.  Patient is not on steroids now.  Has had a cough at time.  Has been recently on Levaquin and is currently on Keflex.  Has had no fevers.  Patient without complaints and is asking for Sprite.  Patient is due to have what sounds like cystoscopy by her urologist for further evaluation of infection and potential interstitial cystitis.  X-ray and blood work done at [facility] earlier in the day.    Past Medical History:  Diagnosis Date  . Asthma   . Autism    mom states no actual dx, but admits she has some symptoms  . Bacteriuria   . Chronic constipation   . Cognitive developmental delay   . Constipation   . Disruptive behavior disorder   . Dyspnea    if she walks much  . Family history of adverse reaction to anesthesia     "hives"-? if anesthesia did take antibiotic  . Frequency-urgency syndrome   . Gait disorder    ORGANIC  PER NERUOLGIST NOTE (DR HICKLING)  . 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 GERD (gastroesophageal reflux disease)   . Gout   . H/O sinus tachycardia   . History of acute respiratory failure 02/14/2015   SECONDARY TO CAP  . History of recurrent UTIs   . Interstitial cystitis   . Moderate intellectual disability   . OSA (obstructive sleep apnea)    no machine yet   . Partial epilepsy with impairment of consciousness (HCC)    FOLLOWED BY DR HICKLING  . Pneumonia 2    4 times this year ( 2020)  . PONV (postoperative nausea and vomiting)   . Seizure (HCC)    most recent sz " at the end of summer"-last sz years ago per mother  . Urinary incontinence     Patient Active Problem List   Diagnosis Date Noted  . Screening for cervical cancer 02/26/2019  . Glucosuria 06/01/2018  . 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 Not well controlled moderate persistent asthma 04/29/2018  . Gastroesophageal reflux disease 04/29/2018  . Recurrent infections 04/29/2018  . Dysuria 02/20/2018  . Chronic rhinitis 04/03/2017  . Recurrent falls 03/21/2017  . Weakness 03/21/2017  . Venous insufficiency 03/21/2017  . OSA on CPAP 09/30/2016  . Excessive daytime sleepiness 03/05/2016  . Disruptive  behavior disorder 03/22/2015  . Cough variant asthma vs UACS/ vcd  03/15/2015  . Autism spectrum disorder 02/14/2015  . Nausea with vomiting 02/14/2015  . Obesity, morbid ([place]) 12/13/2014  . 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 Acanthosis nigricans 11/14/2014  . Toeing-in 08/29/2014  . Generalized convulsive epilepsy ([place]) 09/28/2012  . Partial epilepsy with impairment of consciousness ([place]) 09/28/2012  . 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 Recurrent urinary tract infection 08/01/2011  . Asthma 08/01/2011  . Chronic constipation 08/01/2011  . Contraception  08/01/2011    Past Surgical History:  Procedure Laterality Date  . CYSTOSCOPY N/A 06/03/2017   Procedure: CYSTOSCOPY WITH EXAM UNDER ANESTHESIA;  Surgeon: Festus Aloe, MD;  Location: [facility];  Service: Urology;  Laterality: N/A;  . CYSTOSCOPY  2020  . CYSTOSCOPY WITH HYDRODISTENSION AND BIOPSY  04/14/2012   Procedure: CYSTOSCOPY/BIOPSY/HYDRODISTENSION;  Surgeon: Hanley Ben, MD;  Location: [facility];  Service: Urology;;  instillation of marcaine and pyridium  . EUA/  PAP SMEAR/  BREAST EXAM  03-12-2016   dr Ihor Dow [facility]  . EUA/ VAGINOSCOPY/ COLPOSCOPY FO THE HYMENAL RING  10-04-2009    dr Delsa Sale  [facility]  . TONSILLECTOMY       OB History   No obstetric history on file.     Family History  Problem Relation Age of Onset  . Seizures Mother   . Seizures Sister        1 sister has  . Pancreatic cancer Sister   . Colon cancer Neg Hx   . Colon polyps Neg Hx   . Esophageal cancer Neg Hx   . Rectal cancer Neg Hx   . Stomach cancer Neg Hx     Social History   Tobacco Use  . Smoking status: Never Smoker  . Smokeless tobacco: Never Used  Substance Use Topics  . Alcohol use: No    Alcohol/week: 0.0 standard drinks  . Drug use: No    Home Medications Prior to Admission medications   Medication Sig Start Date End Date Taking? Authorizing Provider  acetaminophen (TYLENOL) 325 MG tablet Take 2 tablets (650 mg total) by mouth every 6 (six) hours as needed for mild pain or moderate pain. 02/27/17   Waynetta Pean, PA-C  albuterol (PROVENTIL) (2.5 MG/3ML) 0.083% nebulizer solution INHALE CONTENTS OF 1 VIAL IN NEBULIZER EVERY 4 TO 6 HOURS IF NEEDED FOR COUGH OR WHEEZING Patient taking differently: Take 2.5 mg by nebulization every 4 (four) hours as needed for wheezing or shortness of breath.  03/17/18   Kozlow, Donnamarie Poag, MD  benzonatate (TESSALON) 200 MG capsule TAKE 1 CAPSULE(200 MG) BY MOUTH THREE TIMES DAILY AS NEEDED Patient not  taking: No sig reported 08/26/18   Hoyt Koch, MD  budesonide (PULMICORT) 1 MG/2ML nebulizer solution Inhale 1 vial 4 times per day, during upper respiratory infections and asthma flares for 1-2 weeks Patient taking differently: Take 1 mg by nebulization 4 (four) times daily. Inhale 1 vial 4 times per day, during upper respiratory infections and asthma flares for 1-2 weeks 11/20/18   Ambs, Kathrine Cords, FNP  cephALEXin (KEFLEX) 250 MG capsule Take 250 mg by mouth daily. 05/04/19   [provider]  cephALEXin (KEFLEX) 500 MG capsule Take 500 mg by mouth 3 (three) times daily.    [provider]  cetirizine (ZYRTEC) 10 MG tablet TAKE 1 TABLET(10 MG) BY MOUTH DAILY Patient  taking differently: Take 10 mg by mouth daily.  12/10/18   Kozlow, Alvira Philips, MD  clotrimazole-betamethasone (LOTRISONE) cream Apply to affected area 2 times daily prn Patient taking differently: Apply 1 application topically daily.  06/29/18   Elvina Sidle, MD  DEPAKOTE 500 MG DR tablet TAKE 1 TABLET BY MOUTH IN THE MORNING AND 2 AT NIGHT Patient taking differently: Take 500-1,000 mg by mouth See admin instructions. 500mg  in the morning and 1000mg  at night 03/15/19   , MD  famotidine (PEPCID) 20 MG tablet Take 1 tablet (20 mg total) by mouth every 12 (twelve) hours. 01/21/19   Pyrtle, Deetta Perla, MD  fluconazole (DIFLUCAN) 150 MG tablet Take 150 mg by mouth as needed (when on antibiotics).    [provider]  fosfomycin (MONUROL) 3 g PACK Take 3 grams by mouth  every 3 DAYS for 4 doses 03/29/19   Carie Caddy, MD  hydrOXYzine (ATARAX/VISTARIL) 25 MG tablet Take 25 mg by mouth 3 (three) times daily as needed (pain).  01/13/19   [provider]  levofloxacin (LEVAQUIN) 750 MG tablet Take 750 mg by mouth daily. 12/11/18   [provider]  Levonorgestrel-Ethinyl Estradiol (SEASONIQUE) 0.15-0.03 &0.01 MG tablet Take 1 tablet by mouth daily. 01/28/19   09-23-1996, CNM   Melatonin 3 MG TABS Take 3 mg by mouth at bedtime.     [provider]  mometasone (NASONEX) 50 MCG/ACT nasal spray Use one spray in each nostril once daily as directed. 05/06/19   Kozlow, Sharyon Cable, MD  montelukast (SINGULAIR) 10 MG tablet TAKE 1 TABLET(10 MG) BY MOUTH AT BEDTIME Patient taking differently: Take 10 mg by mouth at bedtime.  12/10/18   Kozlow, Alvira Philips, MD  Nebulizer MISC Adult mask with tubing. Use as directed with nebulizer device. 11/18/18   Kozlow, Alvira Philips, MD  ondansetron (ZOFRAN ODT) 4 MG disintegrating tablet Take 1 tablet (4 mg total) by mouth every 8 (eight) hours as needed for nausea. Patient taking differently: Take 8 mg by mouth every 8 (eight) hours as needed for nausea.  06/07/18   Alvira Philips, PA-C  PERFOROMIST 20 MCG/2ML nebulizer solution USE 2 MLS BY NEBULIZATION TWICE DAILY Patient taking differently: Take 20 mcg by nebulization 2 (two) times daily.  12/11/18   Kozlow, Garlon Hatchet, MD  polyethylene glycol powder (GLYCOLAX/MIRALAX) 17 GM/SCOOP powder Dissolve 17 grams (1 capful) in at least 8 ounces of water/juice and drink twice daily 02/15/19   Pyrtle, Alvira Philips, MD  promethazine (PHENERGAN) 25 MG suppository Place 1 suppository (25 mg total) rectally every 6 (six) hours as needed for nausea or vomiting. 04/28/19   Carie Caddy, DO  promethazine (PHENERGAN) 25 MG tablet Take 1 tablet (25 mg total) by mouth every 6 (six) hours as needed for nausea or vomiting. 04/28/19   Melene Plan, DO  senna (SENOKOT) 8.6 MG tablet Take 1 tablet by mouth at bedtime.     [provider]  terconazole (TERAZOL 7) 0.4 % vaginal cream Place 1 applicator vaginally at bedtime. 01/28/19   Melene Plan, CNM  traZODone (DESYREL) 50 MG tablet Take 2 tablets (100 mg total) by mouth at bedtime. 03/10/19   Sharyon Cable, NP  YUPELRI 175 MCG/3ML SOLN USE 1 VIAL IN NEBULIZER DAILY - DO NOT MIX WITH OTHER NEB MEDS,USE BEFORE OR AFTER Patient taking differently: Take 3 mLs by nebulization  daily. Do not mix with other neb meds, use before or after. 02/02/19   Elveria Rising  J, MD    Allergies    Abilify [aripiprazole], Seroquel [quetiapine fumarate], Amoxicillin-pot clavulanate, Bactrim [sulfamethoxazole-trimethoprim], Doxycycline, Macrobid [nitrofurantoin macrocrystal], and Keppra [levetiracetam]  Review of Systems   Review of Systems  Constitutional: Negative for appetite change and fatigue.  HENT: Positive for congestion and nosebleeds.   Respiratory: Positive for cough.   Gastrointestinal: Negative for abdominal pain.  Genitourinary: Negative for dysuria.  Skin: Negative for rash.  Neurological: Negative for seizures.  Psychiatric/Behavioral: Negative for confusion.    Physical Exam Updated Vital Signs BP 133/71   Pulse (!) 101   Temp 98.1 F (36.7 C) (Oral)   Resp (!) 21   Ht 4\' 10"  (1.473 m)   Wt 83.9 kg   SpO2 99%   BMI 38.66 kg/m   Physical Exam Vitals and nursing note reviewed.  HENT:     Nose:     Comments: Some dried blood in right nare.  No active bleeding.  Some mucosal injection. Cardiovascular:     Rate and Rhythm: Regular rhythm.  Pulmonary:     Breath sounds: No wheezing, rhonchi or rales.  Abdominal:     Tenderness: There is no abdominal tenderness.  Musculoskeletal:     Right lower leg: No edema.     Left lower leg: No edema.  Skin:    General: Skin is warm.  Neurological:     Comments: Sitting in bed.  Watching TV.  Reportedly at her baseline.     ED Results / Procedures / Treatments   Labs (all labs ordered are listed, but only abnormal results are displayed) Labs Reviewed  URINE CULTURE  CULTURE, BLOOD (ROUTINE X 2)  CULTURE, BLOOD (ROUTINE X 2)    EKG None  Radiology DG Chest Port 1 View  Result Date: 05/09/2019 CLINICAL DATA:  Cough EXAM: PORTABLE CHEST 1 VIEW COMPARISON:  Chest radiograph dated 04/28/2019 FINDINGS: The heart size and mediastinal contours are within normal limits. The lung volumes are low and  there is mild bibasilar atelectasis. There is no pleural effusion or pneumothorax. The visualized skeletal structures are unremarkable. IMPRESSION: Low lung volumes and mild bibasilar atelectasis. Electronically Signed   By: 06/26/2019 M.D.   On: 05/09/2019 15:09    Procedures Procedures (including critical care time)  Medications Ordered in ED Medications - No data to display  ED Course  I have reviewed the triage vital signs and the nursing notes.  Pertinent labs & imaging results that were available during my care of the patient were reviewed by me and considered in my medical decision making (see chart for details).    MDM Rules/Calculators/A&P                      Patient's mother is worried because she is worried the patient has a UTI.  States seizures are becoming a little more frequently.  Reviewed records.  Recent negative culture.  And is currently on antibiotics.  White count however is mildly elevated.  Not febrile.  X-ray does not show pneumonia.  Patient's mother states that the PCP thought that she needed blood cultures although they did not order them.  With frequent infections it is reasonable and they were ordered although no further blood work done.  I do not think she needs empiric antibiotics.  Urine culture also sent since she has had previous UTIs and the white count is mildly elevated.  However I think that the patient's urologist Dr. 05/11/2019 should follow-up the results and need for  treatment.  Will discharge home. Patient's mother states that she had a seizure while getting her discharge papers.  However patient was immediately awake appropriate and able answer questions. Final Clinical Impression(s) / ED Diagnoses Final diagnoses:  Epistaxis    Rx / DC Orders ED Discharge Orders    None       Benjiman Core, MD 05/09/19 [id number]

## 2019-05-09 NOTE — ED Notes (Signed)
MD at bedside. 

## 2019-05-09 NOTE — ED Triage Notes (Signed)
Patient's mother states that the patient has had increased seizures, hemoptysis, and increased N/V, and palpitations. Patient's mother reports that the patient has been on Keflex for UTI.  Patient was at [facility] ED earlier and was discharged. Patient's mother reports that she was not treated right,

## 2019-05-09 NOTE — ED Notes (Signed)
I spoke with mother concerning additional labs she wanted drawn on patient. Reviewed request with providers and family advised no further treatment warranted. Mother than called [facility] and shared her concerns and AC told her the same that as an [employer] fundraiser we cannot force an MD to order labs that they do not feel warranted.

## 2019-05-09 NOTE — ED Provider Notes (Signed)
[facility] EMERGENCY DEPARTMENT Provider Note   CSN: [id number] Arrival date & time: 05/09/19  1316     History No chief complaint on file.   Lori Miles is a 24 y.o. female.  HPI  24 year old female history of autism, UTIs, upper respiratory infection who presents today with mother concerned about intermittent wheezing and nosebleeds.  Patient does have seizure disorder and is having seizures somewhat increased from baseline.  She has had multiple evaluations and had medication levels checked recently and appear to be adequate.  Patient is taking p.o. but has had some intermittent vomiting.  Mother has not noted fever.  She gives albuterol with good relief of symptoms.  She is specifically concerned regarding internal bleeding.     Past Medical History:  Diagnosis Date  . Asthma   . Autism    mom states no actual dx, but admits she has some symptoms  . Bacteriuria   . Chronic constipation   . Cognitive developmental delay   . Constipation   . Disruptive behavior disorder   . Dyspnea    if she walks much  . Family history of adverse reaction to anesthesia     "hives"-? if anesthesia did take antibiotic  . Frequency-urgency syndrome   . Gait disorder    ORGANIC PER NERUOLGIST NOTE (DR HICKLING)  . 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 GERD (gastroesophageal reflux disease)   . Gout   . H/O sinus tachycardia   . History of acute respiratory failure 02/14/2015   SECONDARY TO CAP  . History of recurrent UTIs   . Interstitial cystitis   . Moderate intellectual disability   . OSA (obstructive sleep apnea)    no machine yet   . Partial epilepsy with impairment of consciousness (HCC)    FOLLOWED BY DR HICKLING  . Pneumonia 2    4 times this year ( 2020)  . PONV (postoperative nausea and vomiting)   . Seizure (HCC)    most recent sz " at the end of summer"-last sz years ago per mother  . Urinary incontinence      Patient Active Problem List   Diagnosis Date Noted  . Screening for cervical cancer 02/26/2019  . Glucosuria 06/01/2018  . 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 Not well controlled moderate persistent asthma 04/29/2018  . Gastroesophageal reflux disease 04/29/2018  . Recurrent infections 04/29/2018  . Dysuria 02/20/2018  . Chronic rhinitis 04/03/2017  . Recurrent falls 03/21/2017  . Weakness 03/21/2017  . Venous insufficiency 03/21/2017  . OSA on CPAP 09/30/2016  . Excessive daytime sleepiness 03/05/2016  . 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 Cough variant asthma vs UACS/ vcd  03/15/2015  . Autism spectrum disorder 02/14/2015  . Nausea with vomiting 02/14/2015  . Obesity, morbid (HCC) 12/13/2014  . 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 Acanthosis nigricans 11/14/2014  . Toeing-in 08/29/2014  . 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 Recurrent urinary tract infection 08/01/2011  . Asthma 08/01/2011  . Chronic constipation 08/01/2011  . Contraception 08/01/2011    Past Surgical History:  Procedure Laterality Date  . CYSTOSCOPY N/A 06/03/2017   Procedure: CYSTOSCOPY WITH EXAM UNDER ANESTHESIA;  Surgeon: Jerilee Field, MD;  Location: [facility];  Service: Urology;  Laterality: N/A;  . CYSTOSCOPY  2020  . CYSTOSCOPY WITH HYDRODISTENSION AND BIOPSY  04/14/2012   Procedure: CYSTOSCOPY/BIOPSY/HYDRODISTENSION;  Surgeon: Hanley Ben, MD;  Location: [place];  Service: Urology;;  instillation of marcaine and pyridium  . EUA/  PAP SMEAR/  BREAST EXAM  03-12-2016   dr Ihor Dow [facility]  . EUA/ VAGINOSCOPY/ COLPOSCOPY FO THE HYMENAL RING  10-04-2009    dr Delsa Sale  [facility]  . TONSILLECTOMY       OB  History   No obstetric history on file.     Family History  Problem Relation Age of Onset  . Seizures Mother   . Seizures Sister        1 sister has  . Pancreatic cancer Sister   . Colon cancer Neg Hx   . Colon polyps Neg Hx   . Esophageal cancer Neg Hx   . Rectal cancer Neg Hx   . Stomach cancer Neg Hx     Social History   Tobacco Use  . Smoking status: Never Smoker  . Smokeless tobacco: Never Used  Substance Use Topics  . Alcohol use: No    Alcohol/week: 0.0 standard drinks  . Drug use: No    Home Medications Prior to Admission medications   Medication Sig Start Date End Date Taking? Authorizing Provider  acetaminophen (TYLENOL) 325 MG tablet Take 2 tablets (650 mg total) by mouth every 6 (six) hours as needed for mild pain or moderate pain. 02/27/17   Waynetta Pean, PA-C  albuterol (PROVENTIL) (2.5 MG/3ML) 0.083% nebulizer solution INHALE CONTENTS OF 1 VIAL IN NEBULIZER EVERY 4 TO 6 HOURS IF NEEDED FOR COUGH OR WHEEZING Patient taking differently: Take 2.5 mg by nebulization every 4 (four) hours as needed for wheezing or shortness of breath.  03/17/18   Kozlow, Donnamarie Poag, MD  benzonatate (TESSALON) 200 MG capsule TAKE 1 CAPSULE(200 MG) BY MOUTH THREE TIMES DAILY AS NEEDED Patient taking differently: Take 200 mg by mouth 3 (three) times daily as needed for cough.  08/26/18   Hoyt Koch, MD  budesonide (PULMICORT) 1 MG/2ML nebulizer solution Inhale 1 vial 4 times per day, during upper respiratory infections and asthma flares for 1-2 weeks 11/20/18   Ambs, Kathrine Cords, FNP  cefUROXime (CEFTIN) 500 MG tablet SMARTSIG:1 Tablet(s) By Mouth Every 12 Hours 01/04/19   [provider]  cephALEXin (KEFLEX) 500 MG capsule Take 500 mg by mouth 3 (three) times daily.    [provider]  cetirizine (ZYRTEC) 10 MG tablet TAKE 1 TABLET(10 MG) BY MOUTH DAILY 12/10/18   Kozlow, Donnamarie Poag, MD  ciprofloxacin (CIPRO) 250 MG tablet Take 250 mg by mouth 2 (two) times daily.  04/12/19   [provider]  clotrimazole-betamethasone (LOTRISONE) cream Apply to affected area 2 times daily prn Patient taking differently: Apply 1 application topically daily.  06/29/18   Robyn Haber, MD  DEPAKOTE 500 MG DR tablet TAKE 1 TABLET BY MOUTH IN THE MORNING AND 2 AT NIGHT 03/15/19   Jodi Geralds, MD  erythromycin ophthalmic ointment APPLY INTO LOWER EYELID OF AFFECTED EYE 4 TIMES DAILY FOR 5 DAYS 03/22/19   [provider]  famotidine (PEPCID) 20 MG tablet Take 1 tablet (20 mg total) by mouth every 12 (twelve) hours. 01/21/19   Pyrtle, Lajuan Lines, MD  fluconazole (DIFLUCAN) 150 MG tablet Take 150 mg by mouth as needed (when on antibiotics).    [provider]  fosfomycin (MONUROL) 3 g PACK Take 3 grams by mouth  every 3 DAYS for 4  doses 03/29/19   Judyann Munson, MD  hydrOXYzine (ATARAX/VISTARIL) 25 MG tablet Take 25 mg by mouth 3 (three) times daily as needed (pain).  01/13/19   [provider]  levofloxacin (LEVAQUIN) 750 MG tablet Take 750 mg by mouth daily. 12/11/18   [provider]  Levonorgestrel-Ethinyl Estradiol (SEASONIQUE) 0.15-0.03 &0.01 MG tablet Take 1 tablet by mouth daily. 01/28/19   Sharyon Cable, CNM  Melatonin 3 MG TABS Take 3 mg by mouth at bedtime.     [provider]  mometasone (NASONEX) 50 MCG/ACT nasal spray Use one spray in each nostril once daily as directed. 05/06/19   Kozlow, Alvira Philips, MD  montelukast (SINGULAIR) 10 MG tablet TAKE 1 TABLET(10 MG) BY MOUTH AT BEDTIME 12/10/18   Kozlow, Alvira Philips, MD  Nebulizer MISC Adult mask with tubing. Use as directed with nebulizer device. 11/18/18   Kozlow, Alvira Philips, MD  nitrofurantoin (MACRODANTIN) 100 MG capsule Take 100 mg by mouth 2 (two) times daily. For 7 days, ordered 12/13.    [provider]  nitrofurantoin, macrocrystal-monohydrate, (MACROBID) 100 MG capsule Take 100 mg by mouth 2 (two) times daily. 04/08/19   [provider]  ondansetron  (ZOFRAN ODT) 4 MG disintegrating tablet Take 1 tablet (4 mg total) by mouth every 8 (eight) hours as needed for nausea. Patient taking differently: Take 8 mg by mouth every 8 (eight) hours as needed for nausea.  06/07/18   Garlon Hatchet, PA-C  PERFOROMIST 20 MCG/2ML nebulizer solution USE 2 MLS BY NEBULIZATION TWICE DAILY 12/11/18   Kozlow, Alvira Philips, MD  polyethylene glycol powder (GLYCOLAX/MIRALAX) 17 GM/SCOOP powder Dissolve 17 grams (1 capful) in at least 8 ounces of water/juice and drink twice daily 02/15/19   Pyrtle, Carie Caddy, MD  predniSONE (DELTASONE) 10 MG tablet Take 4 tabs for 1  day, then 3 tabs for 1 day, 2 tabs for 1 day, then 1 tab for day, then stop 04/26/19   Bevelyn Ngo, NP  predniSONE (DELTASONE) 20 MG tablet 2 tabs po daily x 4 days 04/28/19   Melene Plan, DO  promethazine (PHENERGAN) 25 MG suppository Place 1 suppository (25 mg total) rectally every 6 (six) hours as needed for nausea or vomiting. 04/28/19   Melene Plan, DO  promethazine (PHENERGAN) 25 MG tablet Take 1 tablet (25 mg total) by mouth every 6 (six) hours as needed for nausea or vomiting. 04/28/19   Melene Plan, DO  senna (SENOKOT) 8.6 MG tablet Take 1 tablet by mouth at bedtime.     [provider]  terconazole (TERAZOL 7) 0.4 % vaginal cream Place 1 applicator vaginally at bedtime. 01/28/19   Sharyon Cable, CNM  traZODone (DESYREL) 50 MG tablet Take 2 tablets (100 mg total) by mouth at bedtime. 03/10/19   Elveria Rising, NP  trimethoprim-polymyxin b (POLYTRIM) ophthalmic solution  03/23/19   [provider]  YUPELRI 175 MCG/3ML SOLN USE 1 VIAL IN NEBULIZER DAILY - DO NOT MIX WITH OTHER NEB MEDS,USE BEFORE OR AFTER 02/02/19   Kozlow, Alvira Philips, MD    Allergies    Abilify [aripiprazole], Seroquel [quetiapine fumarate], Amoxicillin-pot clavulanate, Bactrim [sulfamethoxazole-trimethoprim], Doxycycline, Macrobid [nitrofurantoin macrocrystal], and Keppra [levetiracetam]  Review of Systems   Review of Systems   HENT: Positive for congestion and nosebleeds.   Respiratory: Positive for wheezing.   Neurological: Positive for seizures.  All other systems reviewed and are negative.   Physical Exam Updated Vital Signs BP 134/68   Pulse 99   Temp  98.2 F (36.8 C) (Oral)   Resp 18   SpO2 95%   Physical Exam Vitals and nursing note reviewed.  Constitutional:      General: She is not in acute distress.    Appearance: Normal appearance. She is normal weight. She is not ill-appearing.  HENT:     Head: Normocephalic.     Right Ear: External ear normal.     Left Ear: External ear normal.     Nose: Nose normal.     Mouth/Throat:     Mouth: Mucous membranes are moist.  Eyes:     Extraocular Movements: Extraocular movements intact.     Pupils: Pupils are equal, round, and reactive to light.  Cardiovascular:     Rate and Rhythm: Normal rate and regular rhythm.     Pulses: Normal pulses.     Heart sounds: Normal heart sounds.  Pulmonary:     Effort: Pulmonary effort is normal.  Abdominal:     General: Bowel sounds are normal.     Palpations: Abdomen is soft.  Musculoskeletal:        General: Normal range of motion.     Cervical back: Normal range of motion.  Skin:    General: Skin is warm and dry.     Capillary Refill: Capillary refill takes less than 2 seconds.  Neurological:     General: No focal deficit present.     Mental Status: She is alert.     Comments: Patient nonverbal but awake alert and follows commands without difficulty  Psychiatric:        Mood and Affect: Mood normal.     ED Results / Procedures / Treatments   Labs (all labs ordered are listed, but only abnormal results are displayed) Labs Reviewed  CBC - Abnormal; Notable for the following components:      Result Value   WBC 15.5 (*)    Hemoglobin 11.9 (*)    All other components within normal limits  LIPASE, BLOOD  COMPREHENSIVE METABOLIC PANEL    EKG EKG Interpretation  Date/Time:  Sunday May 09 2019 13:34:30 EST Ventricular Rate:  106 PR Interval:  128 QRS Duration: 74 QT Interval:  324 QTC Calculation: 430 R Axis:   78 Text Interpretation: Sinus tachycardia T wave abnormality, consider inferior ischemia Abnormal ECG no acute STEMI Confirmed by Marianna Fuss ([id number]) on 05/09/2019 1:49:52 PM   Radiology No results found.  Procedures Procedures (including critical care time)  Medications Ordered in ED Medications  sodium chloride flush (NS) 0.9 % injection 3 mL (has no administration in time range)    ED Course  I have reviewed the triage vital signs and the nursing notes.  Pertinent labs & imaging results that were available during my care of the patient were reviewed by me and considered in my medical decision making (see chart for details).    MDM Rules/Calculators/A&P                      Patient appears stable from baseline.  Vital signs are normal.  White blood cell count is elevated at 15,500 but this is coming down from several weeks ago when she was treated for urinary tract infection and pneumonia.  Mother is concerned regarding pneumonia.  Patient's lungs are clear on my exam and she is not tachypneic, sats are normal, and afebrile.  We have discussed that further testing and antibiotics are probably not beneficial for the patient at this time.  Review of her hemoglobin shows that she has ranged from hemoglobin of 11.1-12.6 over the past year.  Today at 11.9 she appears to be in her average range. Mother requested chest x-Hamza Empson and chest x-Kayleanna Lorman obtained.  Chest x-Virgil Lightner is not crossing over in epic, however, I did go over results with reading.  These are consistent with my read which is some bibasilar atelectasis and poor inspiration.  There is no focal signs of infiltrate Final Clinical Impression(s) / ED Diagnoses Final diagnoses:  Frequent nosebleeds  Wheezing    Rx / DC Orders ED Discharge Orders    None       Margarita Grizzle, MD 05/09/19 1535

## 2019-05-09 NOTE — ED Notes (Signed)
Pt's mom came into the hallways and stated that her daughter had had a seizure. Dr. Rubin Payor was notified and assessed the pt before discharge. Pt's oxygen sats were 98 on room air and she was following commands without problems.

## 2019-05-09 NOTE — Discharge Instructions (Addendum)
No evidence of pneumonia on chest x-Lori Miles. No blood loss noted by blood count. Please call her doctor for follow up Return as needed.

## 2019-05-10 ENCOUNTER — Telehealth: Payer: Medicare Other | Admitting: Adult Health

## 2019-05-10 ENCOUNTER — Telehealth: Payer: Self-pay | Admitting: Acute Care

## 2019-05-10 ENCOUNTER — Encounter: Payer: Self-pay | Admitting: Adult Health

## 2019-05-10 ENCOUNTER — Telehealth (INDEPENDENT_AMBULATORY_CARE_PROVIDER_SITE_OTHER): Payer: Medicare Other | Admitting: Adult Health

## 2019-05-10 DIAGNOSIS — J45991 Cough variant asthma: Secondary | ICD-10-CM

## 2019-05-10 DIAGNOSIS — J310 Chronic rhinitis: Secondary | ICD-10-CM | POA: Diagnosis not present

## 2019-05-10 LAB — URINE CULTURE

## 2019-05-10 MED ORDER — PREDNISONE 20 MG PO TABS: 20.0000 mg | ORAL_TABLET | Freq: Every day | ORAL | 0 refills | Status: DC

## 2019-05-10 NOTE — Progress Notes (Signed)
Reviewed and agree with assessment/plan.   Ladarian Bonczek, MD [facility] 03/13/2016, 12:24 PM Pager:  [phone]  

## 2019-05-10 NOTE — Patient Instructions (Addendum)
Finish Keflex as directed Begin Prednisone 20mg  for 5 days .  Mucinex DM Twice daily  As needed  Cough/congestion  Stop Nasonex .  Saline nasal spray and gel. As needed   Continue on Pulmicort and Perforomist twice daily Use albuterol nebulizer or inhaler as needed Follow up with Dr. in next few weeks  Follow up with Dr. Sharyn Lull  As planned and As needed    Please contact office for sooner follow up if symptoms do not improve or worsen or seek emergency care

## 2019-05-10 NOTE — Telephone Encounter (Signed)
ATC, NA and the VM box was full, WCB

## 2019-05-10 NOTE — Telephone Encounter (Signed)
Spoke with the pt's mother  She states that the pt is not improving since last visit here on 04/26/2019  She states her breathing never improved, but is no worse  She is having nosebleeds and hemoptysis also- she states this is ongoing  Video visit with TP at 4 pm today

## 2019-05-10 NOTE — Progress Notes (Signed)
Virtual Visit via Video Note  I connected with Lori Miles on 05/10/19 at  2:00 PM EST by a video enabled telemedicine application and verified that I am speaking with the correct person using two identifiers.  Location: Patient: Home  Provider: Office    I discussed the limitations of evaluation and management by telemedicine and the availability of in person appointments. The patient expressed understanding and agreed to proceed.  History of Present Illness: 24 year old female never smoker that has a complicated medical history that includes cerebral palsy, mental insufficiency with severe behavioral issues and autism.,  Seizure disorder. Patient has asthma and allergic rhinitis followed by Dr. 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  Patient is accompanied by her mother who is her caregiver.  Patient is nonverbal.  She is alert and able to wait in the background.  Mom says that patient has been having ongoing cough congestion and nosebleeds.  Has been on several different antibiotics recently including Cipro, azithromycin, Levaquin, Keflex.  Patient has chronic urinary tract infections.  Is undergoing an evaluation for this currently. She is currently on Keflex.  Chest x-ray on February 8, February 9, February 10 and February 21 showed no acute process with bibasilar atelectasis. She has been seen in the emergency room twice over the last 2 weeks. ER records were reviewed. Mom complains that she continues to have ongoing nasal congestions intermittent nosebleeds and productive cough occasionally coughs up some blood-tinged mucus especially during her nosebleeds.  Patient has episodes of intermittent vomiting.  Also has reports of seizure activity per mother. Blood cultures were done in the emergency room yesterday are negative to date. Urine  culture 2 weeks ago was negative.  Patient Active Problem List   Diagnosis Date Noted  . Screening for cervical cancer 02/26/2019  . Glucosuria 06/01/2018  . 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 Not well controlled moderate persistent asthma 04/29/2018  . Gastroesophageal reflux disease 04/29/2018  . Recurrent infections 04/29/2018  . Dysuria 02/20/2018  . Chronic rhinitis 04/03/2017  . Recurrent falls 03/21/2017  . Weakness 03/21/2017  . Venous insufficiency 03/21/2017  . OSA on CPAP 09/30/2016  . Excessive daytime sleepiness 03/05/2016  . 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 Cough variant asthma vs UACS/ vcd  03/15/2015  . Autism spectrum disorder 02/14/2015  . Nausea with vomiting 02/14/2015  . Obesity, morbid ([place]) 12/13/2014  . 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 Acanthosis nigricans 11/14/2014  . Toeing-in 08/29/2014  . 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 Partial epilepsy with impairment of consciousness ([place]) 09/28/2012  . 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 Recurrent urinary tract infection 08/01/2011  . Asthma 08/01/2011  . Chronic constipation 08/01/2011  . Contraception 08/01/2011    Current Outpatient Medications on File Prior to Visit  Medication Sig Dispense Refill  . acetaminophen (TYLENOL) 325 MG tablet Take 2 tablets (650 mg total) by mouth every 6 (six) hours as needed for mild pain or moderate pain. 60 tablet 0  . albuterol (PROVENTIL) (2.5 MG/3ML) 0.083% nebulizer solution INHALE CONTENTS OF 1 VIAL IN NEBULIZER EVERY 4 TO 6 HOURS IF NEEDED FOR COUGH OR WHEEZING (Patient taking differently: Take 2.5 mg by nebulization every 4 (four) hours as needed for wheezing or shortness of breath. ) 150  mL 5  . benzonatate (TESSALON) 200 MG capsule TAKE 1 CAPSULE(200 MG) BY MOUTH THREE TIMES DAILY AS NEEDED 60 capsule 0  .  budesonide (PULMICORT) 1 MG/2ML nebulizer solution Inhale 1 vial 4 times per day, during upper respiratory infections and asthma flares for 1-2 weeks (Patient taking differently: Take 1 mg by nebulization 4 (four) times daily. Inhale 1 vial 4 times per day, during upper respiratory infections and asthma flares for 1-2 weeks) 240 mL 5  . cephALEXin (KEFLEX) 250 MG capsule Take 250 mg by mouth daily.    . cephALEXin (KEFLEX) 500 MG capsule Take 500 mg by mouth 3 (three) times daily.    . cetirizine (ZYRTEC) 10 MG tablet TAKE 1 TABLET(10 MG) BY MOUTH DAILY (Patient taking differently: Take 10 mg by mouth daily. ) 30 tablet 5  . clotrimazole-betamethasone (LOTRISONE) cream Apply to affected area 2 times daily prn (Patient taking differently: Apply 1 application topically daily. ) 45 g 0  . DEPAKOTE 500 MG DR tablet TAKE 1 TABLET BY MOUTH IN THE MORNING AND 2 AT NIGHT (Patient taking differently: Take 500-1,000 mg by mouth See admin instructions. 500mg  in the morning and 1000mg  at night) 90 tablet 1  . famotidine (PEPCID) 20 MG tablet Take 1 tablet (20 mg total) by mouth every 12 (twelve) hours. 180 tablet 3  . fluconazole (DIFLUCAN) 150 MG tablet Take 150 mg by mouth as needed (when on antibiotics).    . fosfomycin (MONUROL) 3 g PACK Take 3 grams by mouth  every 3 DAYS for 4 doses 12 g 0  . hydrOXYzine (ATARAX/VISTARIL) 25 MG tablet Take 25 mg by mouth 3 (three) times daily as needed (pain).     . Levonorgestrel-Ethinyl Estradiol (SEASONIQUE) 0.15-0.03 &0.01 MG tablet Take 1 tablet by mouth daily. 3 Package 3  . Melatonin 3 MG TABS Take 3 mg by mouth at bedtime.     . mometasone (NASONEX) 50 MCG/ACT nasal spray Use one spray in each nostril once daily as directed. 17 g 1  . montelukast (SINGULAIR) 10 MG tablet TAKE 1 TABLET(10 MG) BY MOUTH AT BEDTIME (Patient taking differently: Take 10 mg by mouth at bedtime. ) 30 tablet 5  . Nebulizer MISC Adult mask with tubing. Use as directed with nebulizer device. 4  each 12  . ondansetron (ZOFRAN ODT) 4 MG disintegrating tablet Take 1 tablet (4 mg total) by mouth every 8 (eight) hours as needed for nausea. (Patient taking differently: Take 8 mg by mouth every 8 (eight) hours as needed for nausea. ) 10 tablet 0  . PERFOROMIST 20 MCG/2ML nebulizer solution USE 2 MLS BY NEBULIZATION TWICE DAILY (Patient taking differently: Take 20 mcg by nebulization 2 (two) times daily. ) 120 mL 5  . polyethylene glycol powder (GLYCOLAX/MIRALAX) 17 GM/SCOOP powder Dissolve 17 grams (1 capful) in at least 8 ounces of water/juice and drink twice daily 1020 g 1  . promethazine (PHENERGAN) 25 MG suppository Place 1 suppository (25 mg total) rectally every 6 (six) hours as needed for nausea or vomiting. 12 each 0  . promethazine (PHENERGAN) 25 MG tablet Take 1 tablet (25 mg total) by mouth every 6 (six) hours as needed for nausea or vomiting. 30 tablet 0  . senna (SENOKOT) 8.6 MG tablet Take 1 tablet by mouth at bedtime.     terconazole (TERAZOL 7) 0.4 % vaginal cream Place 1 applicator vaginally at bedtime. 45 g 2  . traZODone (DESYREL) 50 MG tablet Take 2 tablets (100 mg total)  by mouth at bedtime. 62 tablet 5  . YUPELRI 175 MCG/3ML SOLN USE 1 VIAL IN NEBULIZER DAILY - DO NOT MIX WITH OTHER NEB MEDS,USE BEFORE OR AFTER (Patient taking differently: Take 3 mLs by nebulization daily. Do not mix with other neb meds, use before or after.) 90 mL 5  . levofloxacin (LEVAQUIN) 750 MG tablet Take 750 mg by mouth daily.     No current facility-administered medications on file prior to visit.    Observations/Objective: 05/10/2019 -patient does not appear in any acute distress    PSG 09/22/18 >>total sleep time 367.5 min. AHI 0.7, SpO2 low 89%, PLMI 0. Took trazodone and melatonin. MSLT 09/23/18 >>sleep onset in 5 of 5 naps with mean sleep latency 29 seconds. 1 of 5 naps with SOREM.    -Sleep study January 06, 2015 at [place] sleep study at [facility] AHI 0.4  (total sleep time 428 minutes)moderate to loud snoring -Sleep study March 27, 2016 AHI 0.1 SaO2 low 90%, positive snoring -Sleep study December 10, 2017 AHI 0.3, SaO2 low 93% (sleep time 413 minutes) positive snoring  -A CPAP titration study was ordered June 2018 with known negative diagnostic sleep study with elimination of snoring on CPAP 10 cm H2O total sleep time 367 minutes with sleep efficiency 98%  Assessment and Plan:  Acute rhinitis/bronchitis-hold on additional antibiotics at this time.  If patient is able on return visit would get a sputum culture. We will give a short course of low-dose steroids for 5 days Hold Nasonex for now as has ongoing nosebleeds. Follow blood culture data Patient is to finish Keflex as directed. Follow-up with Dr. Lucie Leather as planned  Plan  Patient Instructions  Finish Keflex as directed Begin Prednisone 20mg  for 5 days .  Mucinex DM Twice daily  As needed  Cough/congestion  Stop Nasonex .  Saline nasal spray and gel. As needed   Continue on Pulmicort and Perforomist twice daily Use albuterol nebulizer or inhaler as needed Follow up with Dr. in next few weeks  Follow up with Dr. Sharyn Lull  As planned and As needed    Please contact office for sooner follow up if symptoms do not improve or worsen or seek emergency care         Follow Up Instructions: Follow-up with Dr. Craige Cotta as planned and As needed      I discussed the assessment and treatment plan with the patient. The patient was provided an opportunity to ask questions and all were answered. The patient agreed with the plan and demonstrated an understanding of the instructions.   The patient was advised to call back or seek an in-person evaluation if the symptoms worsen or if the condition fails to improve as anticipated.  I provided  25  minutes of non-face-to-face time during this encounter.   Craige Cotta, NP

## 2019-05-13 ENCOUNTER — Other Ambulatory Visit: Payer: Self-pay | Admitting: Allergy and Immunology

## 2019-05-15 ENCOUNTER — Telehealth: Payer: Self-pay | Admitting: Internal Medicine

## 2019-05-15 NOTE — Telephone Encounter (Signed)
Mother called, no better p last televisit mother wants to know if she needs a cxr or more prednisone  rec ov with cxr 05/17/19 to see NP or Dr Craige Cotta but bring all active meds to visit  If wors over w/e to OR

## 2019-05-17 ENCOUNTER — Telehealth: Payer: Self-pay | Admitting: Adult Health

## 2019-05-17 ENCOUNTER — Encounter (HOSPITAL_COMMUNITY): Payer: Self-pay

## 2019-05-17 ENCOUNTER — Emergency Department (HOSPITAL_COMMUNITY)
Admission: EM | Admit: 2019-05-17 | Discharge: 2019-05-17 | Disposition: A | Discharge status: Home / Self Care | Payer: Medicare Other | Attending: Emergency Medicine | Admitting: Emergency Medicine

## 2019-05-17 ENCOUNTER — Emergency Department (HOSPITAL_COMMUNITY): Payer: Medicare Other

## 2019-05-17 ENCOUNTER — Other Ambulatory Visit: Payer: Self-pay

## 2019-05-17 ENCOUNTER — Other Ambulatory Visit (INDEPENDENT_AMBULATORY_CARE_PROVIDER_SITE_OTHER): Payer: Self-pay | Admitting: Family

## 2019-05-17 DIAGNOSIS — R042 Hemoptysis: Secondary | ICD-10-CM

## 2019-05-17 DIAGNOSIS — J45901 Unspecified asthma with (acute) exacerbation: Secondary | ICD-10-CM | POA: Diagnosis not present

## 2019-05-17 DIAGNOSIS — Z79899 Other long term (current) drug therapy: Secondary | ICD-10-CM | POA: Insufficient documentation

## 2019-05-17 DIAGNOSIS — J40 Bronchitis, not specified as acute or chronic: Secondary | ICD-10-CM

## 2019-05-17 DIAGNOSIS — G40209 Localization-related (focal) (partial) symptomatic epilepsy and epileptic syndromes with complex partial seizures, not intractable, without status epilepticus: Secondary | ICD-10-CM

## 2019-05-17 DIAGNOSIS — G40309 Generalized idiopathic epilepsy and epileptic syndromes, not intractable, without status epilepticus: Secondary | ICD-10-CM

## 2019-05-17 LAB — BASIC METABOLIC PANEL
Anion gap: 8 (ref 5–15)
BUN: 16 mg/dL (ref 6–20)
CO2: 23 mmol/L (ref 22–32)
Calcium: 8.2 mg/dL — ABNORMAL LOW (ref 8.9–10.3)
Chloride: 106 mmol/L (ref 98–111)
Creatinine, Ser: 0.89 mg/dL (ref 0.44–1.00)
GFR calc Af Amer: >60 mL/min (ref >60)
GFR calc non Af Amer: >60 mL/min (ref >60)
Glucose, Bld: 107 mg/dL — ABNORMAL HIGH (ref 70–99)
Potassium: 3.5 mmol/L (ref 3.5–5.1)
Sodium: 137 mmol/L (ref 135–145)

## 2019-05-17 LAB — CBC WITH DIFFERENTIAL/PLATELET
Abs Immature Granulocytes: 0.18 10*3/uL — ABNORMAL HIGH (ref 0.00–0.07)
Basophils Absolute: 0 10*3/uL (ref 0.0–0.1)
Basophils Relative: 0 %
Eosinophils Absolute: 0 10*3/uL (ref 0.0–0.5)
Eosinophils Relative: 0 %
HCT: 35.2 % — ABNORMAL LOW (ref 36.0–46.0)
Hemoglobin: 11.8 g/dL — ABNORMAL LOW (ref 12.0–15.0)
Immature Granulocytes: 1 %
Lymphocytes Relative: 42 %
Lymphs Abs: 5.8 10*3/uL — ABNORMAL HIGH (ref 0.7–4.0)
MCH: 30.7 pg (ref 26.0–34.0)
MCHC: 33.5 g/dL (ref 30.0–36.0)
MCV: 91.7 fL (ref 80.0–100.0)
Monocytes Absolute: 1.1 10*3/uL — ABNORMAL HIGH (ref 0.1–1.0)
Monocytes Relative: 8 %
Neutro Abs: 6.9 10*3/uL (ref 1.7–7.7)
Neutrophils Relative %: 49 %
Platelets: 231 10*3/uL (ref 150–400)
RBC: 3.84 MIL/uL — ABNORMAL LOW (ref 3.87–5.11)
RDW: 14.5 % (ref 11.5–15.5)
WBC: 14.1 10*3/uL — ABNORMAL HIGH (ref 4.0–10.5)
nRBC: 0 % (ref 0.0–0.2)

## 2019-05-17 LAB — D-DIMER, QUANTITATIVE: D-Dimer, Quant: <0.27 ug/mL-FEU (ref 0.00–0.50)

## 2019-05-17 MED ORDER — DEPAKOTE 500 MG PO TBEC: DELAYED_RELEASE_TABLET | ORAL | 1 refills | Status: DC

## 2019-05-17 NOTE — Telephone Encounter (Signed)
Patient's mother returning a phone call. [name] can be reached at [phone].

## 2019-05-17 NOTE — Telephone Encounter (Signed)
Message received from answering service that pt still having problems breathing. Coughing up blood and coming out of nose.  Covid test this morning for surgery.  See previous note of Dr. Sherene Sires.  Please advise.  Mom - [phone]

## 2019-05-17 NOTE — ED Triage Notes (Addendum)
Patient arrived POV brought in by mother.   Per mother lung doctor sent patient over.   C/O coughing up blood and shob   Patient diagnosed for pneumonia on the 5th.   Mother request for this RN to refer to notes in chart.   Patient is on low dose keflex for UTI.   Mother reports she is having surgery next week on the 8th and was covid tested today. Mother states nose started bleeding after covid test.

## 2019-05-17 NOTE — Telephone Encounter (Signed)
Called and spoke to [place], pt's mother. She states pt was seen in ED recently for epistaxis and hemoptysis. She states the pt was not prescribed abx by the ED because the ED physician felt pulmonary should prescribe the abx as we are more familiar with the pt - per pts mother. [name] also stated that pt is scheduled for a cystoscopy on 3/8 and her urologist thinks she may need abx as well and would like our office to prescribe. Advised [name] that pt will need a visit to assess the need of abx and follow up from ED. Video visit appt made with Buelah Manis, NP, for 3/2. [name] continued to request abx prior to visit. Informed her that the provider will assess if abx are needed and to keep the video visit. [name] also states the pt may not be present for video visit, informed her the pt must be present for visit, she verbalized understanding.   Will forward to [facility] as FYI.

## 2019-05-17 NOTE — Telephone Encounter (Signed)
ATC pt's mother. Mailbox full. WCB.

## 2019-05-17 NOTE — Telephone Encounter (Signed)
Called and spoke with Patient's Mother, [name].  [name], NP's recommendations given.  Understanding stated.  [name] stated she would take Patient to ED at [facility].  Nothing further at this time.

## 2019-05-17 NOTE — ED Provider Notes (Signed)
[facility]-EMERGENCY DEPT Provider Note   CSN: [id number] Arrival date & time: 05/17/19  1014     History Chief Complaint  Patient presents with  . Hemoptysis    Lori Miles is a 24 y.o. female.  HPI Patient with a history of autism and developmental delay brought to the emergency department by her mother who provides the history.  Patient has had cough, congestion nosebleeds and hemoptysis for approximately a month.  He has been on several different antibiotics and steroids without much improvement.  She also has a history of chronic urinary tract infections and is scheduled for cystoscopy next week.  She had a preop Covid test done today and reports the patient had a brief nosebleed immediately after the test which is since resolved.  Mother has been in contact with the patient's pulmonologist who recommended that she bring her to the emergency department for evaluation if the hemoptysis has continued. Per their notes, they have requested CBC, dimer and CXR be repeated today too. Mother reports patient no longer on steroids.     Past Medical History:  Diagnosis Date  . Asthma   . Autism    mom states no actual dx, but admits she has some symptoms  . Bacteriuria   . Chronic constipation   . Cognitive developmental delay   . Constipation   . Disruptive behavior disorder   . Dyspnea    if she walks much  . Family history of adverse reaction to anesthesia     "hives"-? if anesthesia did take antibiotic  . Frequency-urgency syndrome   . Gait disorder    ORGANIC PER NERUOLGIST NOTE (DR HICKLING)  . 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 GERD (gastroesophageal reflux disease)   . Gout   . H/O sinus tachycardia   . History of acute respiratory failure 02/14/2015   SECONDARY TO CAP  . History of recurrent UTIs   . Interstitial cystitis   . Moderate intellectual disability   . OSA (obstructive sleep apnea)    no machine yet    . Partial epilepsy with impairment of consciousness (HCC)    FOLLOWED BY DR HICKLING  . Pneumonia 2    4 times this year ( 2020)  . PONV (postoperative nausea and vomiting)   . Seizure (HCC)    most recent sz " at the end of summer"-last sz years ago per mother  . Urinary incontinence     Patient Active Problem List   Diagnosis Date Noted  . Screening for cervical cancer 02/26/2019  . Glucosuria 06/01/2018  . 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 Not well controlled moderate persistent asthma 04/29/2018  . Gastroesophageal reflux disease 04/29/2018  . Recurrent infections 04/29/2018  . Dysuria 02/20/2018  . Chronic rhinitis 04/03/2017  . Recurrent falls 03/21/2017  . Weakness 03/21/2017  . Venous insufficiency 03/21/2017  . OSA on CPAP 09/30/2016  . Excessive daytime sleepiness 03/05/2016  . 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 Cough variant asthma vs UACS/ vcd  03/15/2015  . Autism spectrum disorder 02/14/2015  . Nausea with vomiting 02/14/2015  . Obesity, morbid (HCC) 12/13/2014  . 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 Acanthosis nigricans 11/14/2014  . Toeing-in 08/29/2014  . 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 Recurrent urinary tract infection 08/01/2011  .  Asthma 08/01/2011  . Chronic constipation 08/01/2011  . Contraception 08/01/2011    Past Surgical History:  Procedure Laterality Date  . CYSTOSCOPY N/A 06/03/2017   Procedure: CYSTOSCOPY WITH EXAM UNDER ANESTHESIA;  Surgeon: Jerilee Field, MD;  Location: [facility];  Service: Urology;  Laterality: N/A;  . CYSTOSCOPY  2020  . CYSTOSCOPY WITH HYDRODISTENSION AND BIOPSY  04/14/2012   Procedure: CYSTOSCOPY/BIOPSY/HYDRODISTENSION;  Surgeon: Lindaann Slough, MD;  Location: [facility];  Service: Urology;;  instillation of marcaine and pyridium  . EUA/  PAP SMEAR/  BREAST EXAM  03-12-2016   dr Erin Fulling [facility]  . EUA/ VAGINOSCOPY/ COLPOSCOPY FO THE HYMENAL RING  10-04-2009    dr Tamela Oddi  [facility]  . TONSILLECTOMY       OB History   No obstetric history on file.     Family History  Problem Relation Age of Onset  . Seizures Mother   . Seizures Sister        1 sister has  . Pancreatic cancer Sister   . Colon cancer Neg Hx   . Colon polyps Neg Hx   . Esophageal cancer Neg Hx   . Rectal cancer Neg Hx   . Stomach cancer Neg Hx     Social History   Tobacco Use  . Smoking status: Never Smoker  . Smokeless tobacco: Never Used  Substance Use Topics  . Alcohol use: No    Alcohol/week: 0.0 standard drinks  . Drug use: No    Home Medications Prior to Admission medications   Medication Sig Start Date End Date Taking? Authorizing Provider  acetaminophen (TYLENOL) 325 MG tablet Take 2 tablets (650 mg total) by mouth every 6 (six) hours as needed for mild pain or moderate pain. 02/27/17  Yes Everlene Farrier, PA-C  albuterol (PROVENTIL) (2.5 MG/3ML) 0.083% nebulizer solution INHALE CONTENTS OF 1 VIAL IN NEBULIZER EVERY 4 TO 6 HOURS IF NEEDED FOR COUGH OR WHEEZING Patient taking differently: Take 2.5 mg by nebulization every 4 (four) hours as needed for wheezing or shortness of breath.  03/17/18  Yes Kozlow, Alvira Philips, MD  benzonatate (TESSALON) 200 MG capsule TAKE 1 CAPSULE(200 MG) BY MOUTH THREE TIMES DAILY AS NEEDED Patient taking differently: Take 200 mg by mouth 3 (three) times daily as needed for cough.  08/26/18  Yes Myrlene Broker, MD  budesonide (PULMICORT) 1 MG/2ML nebulizer solution Inhale 1 vial 4 times per day, during upper respiratory infections and asthma flares for 1-2 weeks Patient taking differently: Take 1 mg by nebulization 4 (four) times daily. Inhale 1 vial 4 times per day, during upper respiratory  infections and asthma flares for 1-2 weeks 11/20/18  Yes Ambs, Norvel Richards, FNP  cephALEXin (KEFLEX) 250 MG capsule Take 250 mg by mouth daily. 05/04/19  Yes [provider]  cetirizine (ZYRTEC) 10 MG tablet TAKE 1 TABLET(10 MG) BY MOUTH DAILY Patient taking differently: Take 10 mg by mouth daily.  12/10/18  Yes Kozlow, Alvira Philips, MD  clotrimazole-betamethasone (LOTRISONE) cream Apply to affected area 2 times daily prn Patient taking differently: Apply 1 application topically daily as needed (yeast).  06/29/18  Yes Elvina Sidle, MD  DEPAKOTE 500 MG DR tablet TAKE 1 TABLET BY MOUTH IN THE MORNING AND 2 AT NIGHT Patient taking differently: Take 500-1,000 mg by mouth See admin instructions. TAKE 500mg  BY MOUTH IN THE MORNING AND 1000mg  AT NIGHT 05/17/19  Yes , NP  famotidine (PEPCID) 20 MG tablet Take 1 tablet (20 mg total)  by mouth every 12 (twelve) hours. 01/21/19  Yes Pyrtle, Lajuan Lines, MD  fluconazole (DIFLUCAN) 150 MG tablet Take 150 mg by mouth as needed (when on antibiotics).   Yes [provider]  hydrOXYzine (ATARAX/VISTARIL) 25 MG tablet Take 25 mg by mouth 3 (three) times daily as needed (pain).  01/13/19  Yes [provider]  Levonorgestrel-Ethinyl Estradiol (SEASONIQUE) 0.15-0.03 &0.01 MG tablet Take 1 tablet by mouth daily. 01/28/19  Yes Lajean Manes, CNM  Melatonin 3 MG TABS Take 3 mg by mouth at bedtime.    Yes [provider]  mometasone (NASONEX) 50 MCG/ACT nasal spray Use one spray in each nostril once daily as directed. Patient taking differently: Place 1 spray into the nose daily.  05/06/19  Yes Kozlow, Donnamarie Poag, MD  montelukast (SINGULAIR) 10 MG tablet TAKE 1 TABLET(10 MG) BY MOUTH AT BEDTIME Patient taking differently: Take 10 mg by mouth at bedtime.  12/10/18  Yes Kozlow, Donnamarie Poag, MD  ondansetron (ZOFRAN ODT) 4 MG disintegrating tablet Take 1 tablet (4 mg total) by mouth every 8 (eight) hours as needed for nausea. Patient taking  differently: Take 8 mg by mouth every 8 (eight) hours as needed for nausea.  06/07/18  Yes Larene Pickett, PA-C  PERFOROMIST 20 MCG/2ML nebulizer solution USE 2 MLS BY NEBULIZATION TWICE DAILY Patient taking differently: Take 20 mcg by nebulization 2 (two) times daily.  12/11/18  Yes Kozlow, Donnamarie Poag, MD  polyethylene glycol powder (GLYCOLAX/MIRALAX) 17 GM/SCOOP powder Dissolve 17 grams (1 capful) in at least 8 ounces of water/juice and drink twice daily 02/15/19  Yes Pyrtle, Lajuan Lines, MD  promethazine (PHENERGAN) 25 MG suppository Place 1 suppository (25 mg total) rectally every 6 (six) hours as needed for nausea or vomiting. 04/28/19  Yes Deno Etienne, DO  promethazine (PHENERGAN) 25 MG tablet Take 1 tablet (25 mg total) by mouth every 6 (six) hours as needed for nausea or vomiting. 04/28/19  Yes Deno Etienne, DO  senna (SENOKOT) 8.6 MG tablet Take 1 tablet by mouth at bedtime.    Yes [provider]  terconazole (TERAZOL 7) 0.4 % vaginal cream Place 1 applicator vaginally at bedtime. Patient taking differently: Place 1 applicator vaginally at bedtime as needed (yeast).  01/28/19  Yes Lajean Manes, CNM  traZODone (DESYREL) 50 MG tablet Take 2 tablets (100 mg total) by mouth at bedtime. 03/10/19  Yes Goodpasture, Otila Kluver, NP  YUPELRI 175 MCG/3ML SOLN USE 1 VIAL VIA NEBULIZER DAILY Patient taking differently: Inhale 3 mLs into the lungs daily.  05/13/19  Yes Kozlow, Donnamarie Poag, MD  fosfomycin (MONUROL) 3 g PACK Take 3 grams by mouth  every 3 DAYS for 4 doses Patient not taking: Reported on 05/17/2019 03/29/19   Carlyle Basques, MD  Nebulizer MISC Adult mask with tubing. Use as directed with nebulizer device. 11/18/18   Kozlow, Donnamarie Poag, MD  predniSONE (DELTASONE) 20 MG tablet Take 1 tablet (20 mg total) by mouth daily with breakfast. Patient not taking: Reported on 05/17/2019 05/10/19   Parrett, Fonnie Mu, NP    Allergies    Abilify [aripiprazole], Seroquel [quetiapine fumarate], Amoxicillin-pot clavulanate,  Bactrim [sulfamethoxazole-trimethoprim], Doxycycline, Macrobid [nitrofurantoin macrocrystal], and Keppra [levetiracetam]  Review of Systems   Review of Systems  Constitutional: Negative for fever.  HENT: Positive for congestion, nosebleeds and sore throat.   Respiratory: Positive for cough. Negative for shortness of breath.        Hemoptysis  Cardiovascular: Negative for chest pain.  Gastrointestinal: Negative for  abdominal pain, diarrhea, nausea and vomiting.  Genitourinary: Negative for dysuria.  Musculoskeletal: Negative for myalgias.  Skin: Negative for rash.  Neurological: Negative for headaches.  Psychiatric/Behavioral: Negative for behavioral problems.    Physical Exam Updated Vital Signs BP 102/90 (BP Location: Left Arm)   Pulse (!) 110   Temp 98.2 F (36.8 C) (Oral)   Resp 20   SpO2 98%   Physical Exam Constitutional:      Appearance: Normal appearance.  HENT:     Head: Normocephalic and atraumatic.     Nose: Nose normal.     Comments: No active bleeding    Mouth/Throat:     Mouth: Mucous membranes are dry.  Eyes:     Extraocular Movements: Extraocular movements intact.     Conjunctiva/sclera: Conjunctivae normal.  Cardiovascular:     Rate and Rhythm: Normal rate.  Pulmonary:     Effort: Pulmonary effort is normal.     Breath sounds: Normal breath sounds.  Abdominal:     General: Abdomen is flat.     Palpations: Abdomen is soft.     Tenderness: There is no abdominal tenderness.  Musculoskeletal:        General: No swelling. Normal range of motion.     Cervical back: Neck supple.  Skin:    General: Skin is warm and dry.  Neurological:     General: No focal deficit present.     Mental Status: She is alert.  Psychiatric:        Mood and Affect: Mood normal.     ED Results / Procedures / Treatments   Labs (all labs ordered are listed, but only abnormal results are displayed) Labs Reviewed  CBC WITH DIFFERENTIAL/PLATELET - Abnormal; Notable for the  following components:      Result Value   WBC 14.1 (*)    RBC 3.84 (*)    Hemoglobin 11.8 (*)    HCT 35.2 (*)    Lymphs Abs 5.8 (*)    Monocytes Absolute 1.1 (*)    Abs Immature Granulocytes 0.18 (*)    All other components within normal limits  BASIC METABOLIC PANEL - Abnormal; Notable for the following components:   Glucose, Bld 107 (*)    Calcium 8.2 (*)    All other components within normal limits  D-DIMER, QUANTITATIVE (NOT AT [facility])    EKG None  Radiology DG Chest 2 View  Result Date: 05/17/2019 CLINICAL DATA:  Shortness of breath EXAM: CHEST - 2 VIEW COMPARISON:  05/09/2019 FINDINGS: Very low lung volumes. Patchy bibasilar opacities, atelectasis versus infiltrates. No effusions or pneumothorax. Heart is normal size. No acute bony abnormality. IMPRESSION: Low lung volumes with patchy bibasilar atelectasis or infiltrates. Electronically Signed   By: Charlett Nose M.D.   On: 05/17/2019 11:35    Procedures Procedures (including critical care time)  Medications Ordered in ED Medications - No data to display  ED Course  I have reviewed the triage vital signs and the nursing notes.  Pertinent labs & imaging results that were available during my care of the patient were reviewed by me and considered in my medical decision making (see chart for details).  Clinical Course as of May 17 1343  Mon May 17, 2019  1208 Patient patient is nontoxic-appearing, no vital signs concerning for sepsis.  She is alert and asking for Sprite and cheese.   [CS]  1242 WBC has improved from recent highs while taking prednisone.   [CS]  1341 Patient's labs and CXR  reviewed with Rubye Oaks, NP with Pulmonology who is familiar with the patient. She agrees Abx not indicated at this time. Patient has been resting comfortably, eating and drinking without difficulty and no nosebleed or hemoptysis while in the ED. Plan discharge with continued outpatient followup. Mother aware of the plan, questions  answered and patient ready for discharge.    [CS]    Clinical Course User Index [CS] Pollyann Savoy, MD   MDM Rules/Calculators/A&P                       Final Clinical Impression(s) / ED Diagnoses Final diagnoses:  Hemoptysis  Bronchitis    Rx / DC Orders ED Discharge Orders    None       Pollyann Savoy, MD 05/17/19 1345

## 2019-05-17 NOTE — Telephone Encounter (Signed)
Sorry to hear this . If she is coughing up bright red blood needs to go to ER for evaluation , if she declines.  Please order the following .  Please can go to [facility] for Chest xray . (stat)  Would also like labs with cbc w/ diff and  D Dimer (stat )   Please let her know it is difficult to evaluate this over the phone and this could be life threatening and needs evaluation .  Best is to take to ER if she is SOB and coughing up bright red blood.    Please contact office for sooner follow up if symptoms do not improve or worsen or seek emergency care

## 2019-05-17 NOTE — Telephone Encounter (Signed)
Spoke with pt's mother, wants Vinetta to be seen in-person with a CXR as MW suggested this weekend.  Per mother, Terrye is experiencing, sob, prod cough with bright red blood-tinged mucus, nose bleed.  It appears that pt presented to ED for epistaxis on 05/09/19, mother states that "she always has this problem".    Pt was originally offered an OV this morning by MW over the weekend, but because of symptoms and because pt's mother states that pt is waiting on Covid test results for upcoming sx, I tried to offer televisit.  Mother declined, stating that pt must come in for cxr.    Pharmacy: [facility] on [address].   Routing to TP- please advise.  Thanks.

## 2019-05-18 ENCOUNTER — Encounter: Payer: Self-pay | Admitting: Allergy & Immunology

## 2019-05-18 ENCOUNTER — Telehealth: Payer: Self-pay

## 2019-05-18 ENCOUNTER — Ambulatory Visit: Payer: Medicare Other | Admitting: Primary Care

## 2019-05-18 ENCOUNTER — Telehealth (INDEPENDENT_AMBULATORY_CARE_PROVIDER_SITE_OTHER): Payer: Medicare Other | Admitting: Pulmonary Disease

## 2019-05-18 ENCOUNTER — Ambulatory Visit (INDEPENDENT_AMBULATORY_CARE_PROVIDER_SITE_OTHER): Payer: Medicare Other | Admitting: Allergy & Immunology

## 2019-05-18 ENCOUNTER — Encounter: Payer: Self-pay | Admitting: Pulmonary Disease

## 2019-05-18 ENCOUNTER — Other Ambulatory Visit: Payer: Self-pay

## 2019-05-18 DIAGNOSIS — J310 Chronic rhinitis: Secondary | ICD-10-CM | POA: Diagnosis not present

## 2019-05-18 DIAGNOSIS — G4733 Obstructive sleep apnea (adult) (pediatric): Secondary | ICD-10-CM

## 2019-05-18 DIAGNOSIS — J40 Bronchitis, not specified as acute or chronic: Secondary | ICD-10-CM

## 2019-05-18 DIAGNOSIS — N390 Urinary tract infection, site not specified: Secondary | ICD-10-CM

## 2019-05-18 DIAGNOSIS — B999 Unspecified infectious disease: Secondary | ICD-10-CM

## 2019-05-18 DIAGNOSIS — R0981 Nasal congestion: Secondary | ICD-10-CM

## 2019-05-18 DIAGNOSIS — J4540 Moderate persistent asthma, uncomplicated: Secondary | ICD-10-CM

## 2019-05-18 DIAGNOSIS — Z7189 Other specified counseling: Secondary | ICD-10-CM

## 2019-05-18 DIAGNOSIS — F840 Autistic disorder: Secondary | ICD-10-CM | POA: Diagnosis not present

## 2019-05-18 DIAGNOSIS — F819 Developmental disorder of scholastic skills, unspecified: Secondary | ICD-10-CM

## 2019-05-18 DIAGNOSIS — Z9989 Dependence on other enabling machines and devices: Secondary | ICD-10-CM

## 2019-05-18 LAB — CULTURE, BLOOD (ROUTINE X 2)
Culture: NO GROWTH
Culture: NO GROWTH
Special Requests: ADEQUATE

## 2019-05-18 MED ORDER — PREDNISONE 10 MG PO TABS: ORAL_TABLET | ORAL | 0 refills | Status: DC

## 2019-05-18 NOTE — Telephone Encounter (Signed)
Patient's mother scheduled a Televisit today with Dr. Dellis Anes.

## 2019-05-18 NOTE — Progress Notes (Signed)
RE: NOA GALVAO MRN: [id number] DOB: May 31, 1995 Date of Telemedicine Visit: 05/18/2019  Referring provider: Karlyne Greenspan Primary care provider: Scifres, Nicole Cella, PA-C  Chief Complaint: See telephone doc from today   Telemedicine Follow Up Visit via Telephone: I connected with Daneen Schick for a follow up on 05/19/19 by telephone and verified that I am speaking with the correct person using two identifiers.   I discussed the limitations, risks, security and privacy concerns of performing an evaluation and management service by telephone and the availability of in person appointments. I also discussed with the patient that there may be a patient responsible charge related to this service. The patient expressed understanding and agreed to proceed.  Patient is in the car accompanied by her mother who provided/contributed to the history.  Provider is at the office.  Visit start time: 2:56 PM Visit end time: 3:26 PM Insurance consent/check in by: Fredric Mare Medical consent and medical assistant/nurse: Wynona Canes  History of Present Illness:  She is a 24 y.o. female, who is being followed for moderate persistent asthma as well as chronic rhinitis and GERD. Her previous allergy office visit was in September 2020 with Thermon Leyland, FNP.  At that visit, she was continued on her twice daily dosing of inhaled medications including budesonide, Perforomist, and Yupelri.  She was also continued on Nasonex, montelukast, and cetirizine.  Her reflux was controlled with famotidine 40 mg twice a day as well as some dietary restrictions.  Evidently she had had quite a few episodes requiring prednisone and Dr. Lucie Leather recommended that she stay away from prednisone due to the side effects.  In the interim, she has done well from a breathing perspective.  However, mom calls today due to a sick visit. Mom reports that her breathing is "very bad". Mom reports that her HR is either low or high (she cannot  seem to remember which is the case). She had pneumonia on February 5th. She has been coughing up the blood from the mouth and the nose.  In the last month, she has had a combination of 4 different antibiotics.  Apparently these included levofloxacin, ciprofloxacin, azithromycin, ceftazidime (this is where she tries to spell out over the phone today, I believe), and her current Keflex that she is on.  These are nicely reviewed in Pulmonology Nurse Practitioner Lorrene Reid telephone visit from today.  Apparently she has even been seen by infectious disease and additional antibiotics have been discouraged due to the risk of the development of multidrug-resistant organisms.  She is having surgery next Monday and is on a "low dose" Keflex for her surgery next Monday. 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 Mom thinks that this is related to her "low immune system".  However, mom does not go into more detail on the low immune system.  Mom's main complaint is her congestion, which is leading to difficulties going to physical therapy.  Apparently, at physical therapy with the congestion, it is assumed she has Covid so she is not allowed to go.  Apparently the patient has reported that she has throat pain.  Her mother reports that her "wheezing is getting out of control".   She has been on prednisone prior to February 5th. She had some more prednisone on the 9th. Then she had some more last week.  Mom is unable to tell me exactly how much prednisone she has had.  However, mom is asking for an antibiotic today because Dr. Lucie Leather "does not want  just been to have prednisone".  Mom then tells me that she thinks the prednisone was 10 mg daily or so.  She is not entirely sure.  Otherwise, there have been no changes to her past medical history, surgical history, family history, or social history.  Assessment and Plan:  Ayris is a 24 y.o. female with:  Moderate persistent asthma - stable on multiple nebulized  medications  Chronic rhinitis  Complicated past medical history, including high functioning autism    Jill Alexanders presents for a sick visit.  Review of the notes show that mom is searching around for some antibiotics.  She has literally had at least 3 classes of antibiotics and 4-5 rounds total in the last month, so I do not know what I would be treating if I added another 1 today.  Mom was asking the pulmonology nurse practitioner for IV antibiotics for 3 weeks, which he declined.  Evidently, mom hung up on him when he declined to give the antibiotics.  It is unclear what we would be treating.  Mom tells me that she needs to be on antibiotics prior to his surgery next week, but if that were the case then I would assume that the urologist would send in the antibiotic.  I did try to convince mom to get her into ENT so they can get a nasal swab so that we can do some more targeted treatment.  However, she tells me that [facility] want tolerate that.  I then recommended Afrin or another nose spray for a few days and mom said that [facility] would not tolerate that either.  I think a nose spray would be the best in this case, but mom continues to refuse any no spray aside from nasal saline.  I did go ahead and send in a higher dose prednisone burst to see if that would do the trick.  I also recommended the use of Mucinex, but mom tells me that she is afraid it would interfere with some of her medications.  Finally, I convinced mom to try the prednisone and I told her I would talk personally to Dr. Lucie Leather about what he recommends.  I did talk to Dr. Lucie Leather and he did not feel that antibiotics are indicated either.  This was communicated back to Bernice's mother.  Diagnostics: None.  Medication List:  Current Outpatient Medications  Medication Sig Dispense Refill  . acetaminophen (TYLENOL) 325 MG tablet Take 2 tablets (650 mg total) by mouth every 6 (six) hours as needed for mild pain or moderate pain. 60 tablet 0    . albuterol (PROVENTIL) (2.5 MG/3ML) 0.083% nebulizer solution INHALE CONTENTS OF 1 VIAL IN NEBULIZER EVERY 4 TO 6 HOURS IF NEEDED FOR COUGH OR WHEEZING (Patient taking differently: Take 2.5 mg by nebulization every 4 (four) hours as needed for wheezing or shortness of breath. ) 150 mL 5  . benzonatate (TESSALON) 200 MG capsule TAKE 1 CAPSULE(200 MG) BY MOUTH THREE TIMES DAILY AS NEEDED (Patient taking differently: Take 200 mg by mouth 3 (three) times daily as needed for cough. ) 60 capsule 0  . budesonide (PULMICORT) 1 MG/2ML nebulizer solution Inhale 1 vial 4 times per day, during upper respiratory infections and asthma flares for 1-2 weeks (Patient taking differently: Take 1 mg by nebulization 4 (four) times daily. Inhale 1 vial 4 times per day, during upper respiratory infections and asthma flares for 1-2 weeks) 240 mL 5  . cephALEXin (KEFLEX) 250 MG capsule Take 250 mg by mouth daily.    Marland Kitchen  DEPAKOTE 500 MG DR tablet TAKE 1 TABLET BY MOUTH IN THE MORNING AND 2 AT NIGHT (Patient taking differently: Take 500-1,000 mg by mouth See admin instructions. TAKE 500mg  BY MOUTH IN THE MORNING AND 1000mg  AT NIGHT) 90 tablet 1  . famotidine (PEPCID) 20 MG tablet Take 1 tablet (20 mg total) by mouth every 12 (twelve) hours. 180 tablet 3  . fluconazole (DIFLUCAN) 150 MG tablet Take 150 mg by mouth as needed (when on antibiotics).    . hydrOXYzine (ATARAX/VISTARIL) 25 MG tablet Take 25 mg by mouth 3 (three) times daily as needed (pain).     . Levonorgestrel-Ethinyl Estradiol (SEASONIQUE) 0.15-0.03 &0.01 MG tablet Take 1 tablet by mouth daily. 3 Package 3  . Melatonin 3 MG TABS Take 3 mg by mouth at bedtime.     . montelukast (SINGULAIR) 10 MG tablet TAKE 1 TABLET(10 MG) BY MOUTH AT BEDTIME (Patient taking differently: Take 10 mg by mouth at bedtime. ) 30 tablet 5  . Nebulizer MISC Adult mask with tubing. Use as directed with nebulizer device. 4 each 12  . ondansetron (ZOFRAN ODT) 4 MG disintegrating tablet Take 1  tablet (4 mg total) by mouth every 8 (eight) hours as needed for nausea. (Patient taking differently: Take 8 mg by mouth every 8 (eight) hours as needed for nausea. ) 10 tablet 0  . PERFOROMIST 20 MCG/2ML nebulizer solution USE 2 MLS BY NEBULIZATION TWICE DAILY (Patient taking differently: Take 20 mcg by nebulization 2 (two) times daily. ) 120 mL 5  . polyethylene glycol powder (GLYCOLAX/MIRALAX) 17 GM/SCOOP powder Dissolve 17 grams (1 capful) in at least 8 ounces of water/juice and drink twice daily 1020 g 1  . promethazine (PHENERGAN) 25 MG suppository Place 1 suppository (25 mg total) rectally every 6 (six) hours as needed for nausea or vomiting. 12 each 0  . promethazine (PHENERGAN) 25 MG tablet Take 1 tablet (25 mg total) by mouth every 6 (six) hours as needed for nausea or vomiting. 30 tablet 0  . senna (SENOKOT) 8.6 MG tablet Take 1 tablet by mouth at bedtime.     Marland Kitchen terconazole (TERAZOL 7) 0.4 % vaginal cream Place 1 applicator vaginally at bedtime. (Patient taking differently: Place 1 applicator vaginally at bedtime as needed (yeast). ) 45 g 2  . traZODone (DESYREL) 50 MG tablet Take 2 tablets (100 mg total) by mouth at bedtime. 62 tablet 5  . YUPELRI 175 MCG/3ML SOLN USE 1 VIAL VIA NEBULIZER DAILY (Patient taking differently: Inhale 3 mLs into the lungs daily. ) 270 mL 0  . cetirizine (ZYRTEC) 10 MG tablet TAKE 1 TABLET(10 MG) BY MOUTH DAILY (Patient taking differently: Take 10 mg by mouth daily. ) 30 tablet 5  . clotrimazole-betamethasone (LOTRISONE) cream Apply to affected area 2 times daily prn (Patient not taking: Reported on 05/18/2019) 45 g 0  . fosfomycin (MONUROL) 3 g PACK Take 3 grams by mouth  every 3 DAYS for 4 doses (Patient not taking: Reported on 05/18/2019) 12 g 0  . mometasone (NASONEX) 50 MCG/ACT nasal spray Use one spray in each nostril once daily as directed. (Patient not taking: Reported on 05/18/2019) 17 g 1  . predniSONE (DELTASONE) 10 MG tablet Take three tablets twice daily  for 7 days. 42 tablet 0   No current facility-administered medications for this visit.   Allergies: Allergies  Allergen Reactions  . Abilify [Aripiprazole] Swelling and Palpitations  . Seroquel [Quetiapine Fumarate] Palpitations  . Amoxicillin-Pot Clavulanate Hives and Other (See Comments)  Has patient had a PCN reaction causing immediate rash, facial/tongue/throat swelling, SOB or lightheadedness with hypotension: No Has patient had a PCN reaction causing severe rash involving mucus membranes or skin necrosis:    #  #  YES  #  #  Has patient had a PCN reaction that required hospitalization: No Has patient had a PCN reaction occurring within the last 10 years: No   . Bactrim [Sulfamethoxazole-Trimethoprim] Other (See Comments)    Abdominal pain  . Doxycycline Other (See Comments)    Stomach cramps  . Macrobid [Nitrofurantoin Macrocrystal] Other (See Comments)    Sharp pain/ given with Tylenol 325 mg  . Keppra [Levetiracetam] Other (See Comments)    Aggressive behavior    I reviewed her past medical history, social history, family history, and environmental history and no significant changes have been reported from previous visits.  Review of Systems  Constitutional: Negative for activity change, appetite change, chills, diaphoresis, fatigue and fever.  HENT: Positive for congestion and nosebleeds. Negative for hearing loss, postnasal drip, rhinorrhea, sinus pressure, sinus pain and sore throat.   Eyes: Negative for pain, discharge, redness and itching.  Respiratory: Negative for shortness of breath, wheezing and stridor.   Gastrointestinal: Negative for diarrhea, nausea and vomiting.  Endocrine: Negative for cold intolerance and heat intolerance.  Musculoskeletal: Negative for arthralgias, back pain, gait problem, joint swelling and myalgias.  Skin: Negative for rash.  Allergic/Immunologic: Negative for environmental allergies and food allergies.  Psychiatric/Behavioral:  Positive for agitation.    Objective:  Physical exam not obtained as encounter was done via telephone.   Previous notes and tests were reviewed.  I discussed the assessment and treatment plan with the patient. The patient was provided an opportunity to ask questions and all were answered. The patient agreed with the plan and demonstrated an understanding of the instructions.   The patient was advised to call back or seek an in-person evaluation if the symptoms worsen or if the condition fails to improve as anticipated.  I provided 30 minutes of non-face-to-face time during this encounter.  It was my pleasure to participate in Irene Holzman's care today. Please feel free to contact me with any questions or concerns.   Sincerely,  Alfonse Spruce, MD

## 2019-05-18 NOTE — Assessment & Plan Note (Signed)
This was a difficult visit to complete virtually.  There continues to be ongoing issues with health literacy and understanding appropriate antibiotic stewardship.  Patient has had a history of recurrent bronchitis-like episodes as well as urinary tract infections.  Patient has had multiple rounds of antibiotics over the last month without much symptom improvement.  Chest x-rays are clear.  Patient in no visible distress during the video visit.  Patient has had follow-ups with infectious disease with they were counseled on the appropriate use of antibiotics.  I believe giving the antibiotics as requested by the patient's mother: Ciprofloxacin or clindamycin, at greater than 21-day courses is clinically inappropriate and could be detrimental to the patient.  Patient's mother disagrees and reports that antibiotics are there "to help people".  I tried to explain that yes they were there to treat bacterial infections but the antibiotics that the patient has had already likely would have treated any sort of bacterial infection.  At this point in time we are running a significant risk of developing a adverse reaction to antibiotics such as C. difficile, tendinitis, or multidrug-resistant organisms as counseled previously by Dr. Ilsa Iha with infectious disease.  Plan: Continue follow-up with specialist Encouraged patient's mother to schedule an appointment with ear nose and throat Dr. Annalee Genta for second opinion, she declined

## 2019-05-18 NOTE — Assessment & Plan Note (Signed)
Patient carries a clinical diagnosis history of asthma No visible distress on video visit today Recently seen in the emergency room Status post 3-4 rounds of antibiotics within the last month, recent courses of prednisone Continues to have nasal congestion  Plan: Keep follow-up with Dr. Lucie Leather Keep follow-up with our office we will discuss this video visit with Dr. Craige Cotta

## 2019-05-18 NOTE — Telephone Encounter (Signed)
Please inform parent that I have not seen Lori Miles in 1 year.  She needs to be seen to address these issues.  I believe my schedule is full today so maybe she can come in tonight clinic with Dr. Dellis Anes this evening.

## 2019-05-18 NOTE — Assessment & Plan Note (Signed)
Based off of video visit exam as well as emergency room note I do not believe the patient needs additional antibiotics at this time  Plan: Continue follow-up with Dr. Lucie Leather Continue follow-up with infectious disease Dr. Ilsa Iha

## 2019-05-18 NOTE — Assessment & Plan Note (Signed)
Did not have time to fully address this on video visit today Patient's mother hung up video visit preemptively during discussion  Plan: Continue CPAP therapy

## 2019-05-18 NOTE — Telephone Encounter (Signed)
Patients mother called in stating Lori Miles was seen in the ER yesterday for a chronic UTI, congestion, pneumonia and coughing up blood as well as epistaxis. She states that Lori Miles is due to have surgery this coming up Monday, and her pulmonologist will not prescribe her an antibiotic. She has been on prednisone for the last 2 weeks off and on.   Mom wants Dr. Lucie Leather to prescribe her something for congestion "like an antibiotic." Her notes are in her past encounters from her ER trip, urgent care, pulmonology, urology/radiology. Please advise.

## 2019-05-18 NOTE — Assessment & Plan Note (Signed)
Continue nasal congestion  This continues to be a difficult symptom for management for the patient given her autism and developmental delay.  Patient needs to continue to use nasal saline rinses to the best of her ability as well as nasal saline gel.  Patient is intolerant of nasal sprays and is prone to nosebleeds from these.  These have been stopped in the past.  Plan: Continue current regimen Schedule follow-up with Dr. Lucie Leather

## 2019-05-18 NOTE — Assessment & Plan Note (Signed)
Due to length of visit unable to fully assess today  This continues to be a barrier to the patient's care.  Thankfully her mother helps care for her.  Plan: Continue follow-up with primary care

## 2019-05-18 NOTE — Assessment & Plan Note (Addendum)
Reviewed emergency room note with patient's mother Patient has had 3-4 rounds of antibiotics in the last month 5 recent chest x-rays Reviewed that likely the symptoms patient is dealing with is not bacterial I have significant concerns regarding recurrent use of antibiotics for the patient I shared those concerns with the patient's mother We discussed that I do not believe it is appropriate for [name] to do another round of antibiotics at this time I also reviewed the recent notes from infectious disease  Discussion: I am very concerned regarding the conversation on this video visit today.  Patient's mother is requesting ciprofloxacin or IV clindamycin for greater than 21 days for patient's symptoms of nasal congestion despite little symptom improvement with antibiotic coverage earlier this month.  Patient is already had coverage with fluoroquinolones.  I personally contacted urologist Dr. Logan Bores who specifically stated the patient does not need to be placed on ciprofloxacin.  He agrees with our stated work-up.  He also has the same concerns that unfortunately there continues to be a barrier and health literacy on the best way to care for the patient.  I explained to the patient's mother multiple times that clinically we need to be careful with our antibiotic use for the patient given the fact that she is an exceptionally high risk of developing a multidrug-resistant organism.  I explained multiple times that clinically I do not believe it is appropriate for [name] to proceed forward with additional antibiotics at this time.  I did encourage patient's mother to discuss the symptoms with patient's ear nose and throat provider Dr. Annalee Genta.  Patient's mother reports that they do not follow-up with Dr. Annalee Genta anymore since he does not treat her with antibiotics.  Primary care is coordinating a second ENT referral to Dr. Suszanne Conners.   Unfortunately patient's mother hung up the video visit before we could finish the  video visit.  I did not have time to offer setting up a in person evaluation with the respiratory clinic.  Plan: Continue to clinically monitor the patient I do not believe that we need to have additional antibiotics at this time Will route note to Dr. Ilsa Iha with infectious diseases FYI Will discuss case with Dr. Craige Cotta

## 2019-05-18 NOTE — Assessment & Plan Note (Signed)
This continues to be a ongoing barrier for the patient.  Plan: Continue follow-up with primary care

## 2019-05-18 NOTE — Progress Notes (Signed)
Virtual Visit via Video Note  I connected with Lori Miles on 05/18/19 at  9:00 AM EST by a video enabled telemedicine application and verified that I am speaking with the correct person using two identifiers.  Location: Patient: Home Provider: Office - [facility] - [address] [place], [address], [place], [place] [address]  I discussed the limitations of evaluation and management by telemedicine and the availability of in person appointments. The patient expressed understanding and agreed to proceed. I also discussed with the patient that there may be a patient responsible charge related to this service. The patient expressed understanding and agreed to proceed.  Patient consented to consult via telephone: Yes People present and their role in pt care: Pt   History of Present Illness:  24 year old female never smoker followed in our office for cough variant asthma  Past medical history: Epilepsy, weakness, GERD steroid-induced diabetes, behavior disorder, autism/developmental delay Smoking history: Never smoker Maintenance: Pulmicort nebs, Performist  Patient of Dr. Craige Cotta  Chief complaint:    23 year old female never smoker followed in our office for cough variant asthma.  Patient was seen in the emergency room on 05/17/2019.  Patient with history of autism and developmental delay.  Patient had a cough, congestion and nosebleeds for approximately a month.  She has been on several different antibiotics and steroids without much improvement.  She had a preop Covid test done today and reports that patient had a brief nosebleed immediately after the test which is since resolved.  Patient seen in the emergency room and discharged.  D-dimer negative.  Patient has had multiple rounds of antibiotics over the last 3 months. Patient continues to have persistent rhinitis symptoms, with no symptomatic relief based off of antibiotics.  Last note from TP NP in our office from 05/10/19 states:   Mom  says that patient has been having ongoing cough congestion and nosebleeds.  Has been on several different antibiotics recently including Cipro, azithromycin, Levaquin, Keflex.  Patient has chronic urinary tract infections.  Is undergoing an evaluation for this currently. She is currently on Keflex.  Chest x-ray on February 8, February 9, February 10 and February 21 showed no acute process with bibasilar atelectasis. She has been seen in the emergency room twice over the last 2 weeks. ER records were reviewed.  Video visit started today legal guardian Lori Miles (patient's mom) patient in the background.  Patient with history of autism and developmental delay.  Video visit being completed today after emergency room visit yesterday.  Patient mother reports patient was diagnosed with bronchitis yesterday.  Over the last month patient has had 5 chest x-rays.  Patient has also had 3-4 rounds of antibiotics mainly pertaining to chronic UTI which she is preparing to have surgery for next week with Dr. Logan Bores.  Patient's mother reporting that urology wants the patient on a antibiotic to help with her breathing.  Patient continues to have persistent nasal congestion despite antibiotic coverage.  Patient's mother reports that some of the antibiotics have helped but the patient typically benefits from having longer courses of antibiotics greater than 21 days.  Patient has been referred to an ear nose and throat specialist Dr. Annalee Genta.  They have stopped having follow-up with Dr. Thurmon Fair office after not receiving antibiotics at previous follow-ups.  Patient potentially could benefit from a surgical intervention by Dr. Annalee Genta but it was felt that due to the patient's autism as well as developmental delays this would not be appropriate.  Since then they have  not followed up with Dr. Annalee Genta.   Patient is also been followed by infectious disease Dr. Ilsa Iha.  She was seen in January/2021 and was prescribed  fosfomycin.  Patient's mother reporting that he did not take this.  Patient was seen again on 05/03/2019.  The assessment and plan as listed below from Dr. Ilsa Iha:  Assessment and Plan  Despite discussing that more antibiotics, places Lori Miles at further risk for MDRO, her mother states that she needs to be on an antibiotic continuously.  I will recommend to her pcp to be judicious with abtx prescription. Would always get UA with reflex urine cx to see if need to treat when she is symptomatic. If low level CFU<100,000; then consider not treating  Very difficult case due to low health literacy, and her daughter's cognitive impairment and unable to articulate her feelings. Also need to figure out how to have her understand the concept that every sinusitis and positive urine culture does not need to be treated.   She has upcoming urology appointment that she should keep to see if need further investigation for hematuria.  Offered her second opinion within the practice if she would like.  Spent 35 min with patient's mother discussing that she does not need abtx at this time.  Patient's mother is concerned as patient is scheduled to have surgery next week.  She reports that Dr. Sharlot Gowda office wants the patient on a "low antibiotic".  Patient's mother is specifically requesting: Ciprofloxacin or clindamycin IV.  She reports that patient has done better after having these in the past.  Patient only had a course of 3 days of ciprofloxacin was switched to Levaquin based off of concerns that there may be infiltrates versus atelectasis on her x-ray earlier this month.  Lab work from yesterday's emergency room shows an elevated white blood cell count after recent prednisone dosing.  Patient's rhinitis regimen is nasal saline drops as well as gel.  Patient is intolerant of nasal saline rinses.  Nasal spray medications have been discontinued due to recurrent nosebleeds when taking these.  Patient's mother is  frustrated as she feels the patient should been treated with antibiotics at the emergency room.  She is unsure why she needs to have a follow-up with our office today.  She is also frustrated that our office did not call her back yesterday.  Patient in no visible distress on video visit today in the backseat of the car.  Observations/Objective:  04/26/2019-chest x-ray-mild right base atelectasis, lungs elsewise clear, cardiac silhouette normal 04/27/2019-chest x-ray-poor inspiration, lungs clear, normal vascularity, no acute abnormality 04/28/2019-chest x-ray-low volumes and atelectasis, otherwise no acute cardiopulmonary disease 05/09/2019-chest x-ray-low lung volumes and mild bibasilar atelectasis 05/17/2019-chest x-ray-low lung volumes with patchy bibasilar atelectasis or infiltrates  05/17/2019-D-dimer-less than 0.27 05/17/2019-CBC with differential-white blood cell count 14.1, hemoglobin 11.8,  Spirometry 02/12/17 >> FEV1 1.58 (69%), FEV1% 93  Echo 05/29/15 >> EF 55 to 60%  03/18/2017-CT head without contrast-sinuses orbits: Frothy material within right maxillary sinus, sphenoid sinuses clear, left maxillary sinus clear, ethmoid air cells and frontal sinuses are clear, no mastoid effusion, negative head CT for acute intracranial abnormality, right maxillary sinus disease  Social History   Tobacco Use  Smoking Status Never Smoker  Smokeless Tobacco Never Used   Immunization History  Administered Date(s) Administered  . Pneumococcal Polysaccharide-23 08/06/2016  . Tdap 03/03/2017    Assessment and Plan:  Discussion: This was a very difficult video visit today.  Patient's mother specifically requesting antibiotics for treatment  of nasal congestion as well as bronchitis symptoms.  Specifically requesting either ciprofloxacin or clindamycin IV.  Specifically requesting 21 days or greater as a length of course.  We had multiple discussions today regarding antibiotics, antibiotic stewardship, risk  of overuse of antibiotics, as well as appropriate treatment for bronchitis symptoms.  Patient in the background of the video visit in no apparent distress.  Patient with clear chest x-ray yesterday.  Patient with one episode of epistaxis status post COVID-19 test.  Patient's mother adamant the patient needs to be on antibiotics for her surgery next week with Dr. Logan Bores.   I tried to explain multiple times to the patient's mother my concerns with the patient's recurrent antibiotic use.  I am also very concerned of the risk of placing the patient on additional antibiotics.  Clinically I am unsure what we would be essentially treating as her chest x-rays are clear and she is already had 3-4 rounds of antibiotics within the last month.  More importantly I am very concerned about the damage that can be done by prescribing additional floraquinolones or IV clindamycin.  I am also very concerned at the length of antibiotic course the patient's mother is looking for greater than 21 days.   I personally contacted Dr. Logan Bores after completing this video visit and updated Dr. Logan Bores.  He agreed with my assessment as well as work-up moving forward.  He is aware the patient did not receive antibiotics from our office today.  I will also discuss video visit with Dr. Craige Cotta.  Unfortunately patient's mother ended the video visit preemptively so we are not able to finish this visit in its entirety.  Bronchitis Reviewed emergency room note with patient's mother Patient has had 3-4 rounds of antibiotics in the last month 5 recent chest x-rays Reviewed that likely the symptoms patient is dealing with is not bacterial I have significant concerns regarding recurrent use of antibiotics for the patient I shared those concerns with the patient's mother We discussed that I do not believe it is appropriate for [name] to do another round of antibiotics at this time I also reviewed the recent notes from infectious  disease  Discussion: I am very concerned regarding the conversation on this video visit today.  Patient's mother is requesting ciprofloxacin or IV clindamycin for greater than 21 days for patient's symptoms of nasal congestion despite little symptom improvement with antibiotic coverage earlier this month.  Patient is already had coverage with fluoroquinolones.  I personally contacted urologist Dr. Logan Bores who specifically stated the patient does not need to be placed on ciprofloxacin.  He agrees with our stated work-up.  He also has the same concerns that unfortunately there continues to be a barrier and health literacy on the best way to care for the patient.  I explained to the patient's mother multiple times that clinically we need to be careful with our antibiotic use for the patient given the fact that she is an exceptionally high risk of developing a multidrug-resistant organism.  I explained multiple times that clinically I do not believe it is appropriate for [name] to proceed forward with additional antibiotics at this time.  I did encourage patient's mother to discuss the symptoms with patient's ear nose and throat provider Dr. Annalee Genta.  Patient's mother reports that they do not follow-up with Dr. Annalee Genta anymore since he does not treat her with antibiotics.  Primary care is coordinating a second ENT referral to Dr. Suszanne Conners.   Unfortunately patient's mother hung up the  video visit before we could finish the video visit.  I did not have time to offer setting up a in person evaluation with the respiratory clinic.  Plan: Continue to clinically monitor the patient I do not believe that we need to have additional antibiotics at this time Will route note to Dr. Graylon Good with infectious diseases FYI Will discuss case with Dr. Halford Chessman       Asthma Patient carries a clinical diagnosis history of asthma No visible distress on video visit today Recently seen in the emergency room Status post 3-4 rounds of  antibiotics within the last month, recent courses of prednisone Continues to have nasal congestion  Plan: Keep follow-up with Dr. Neldon Mc Keep follow-up with our office we will discuss this video visit with Dr. Halford Chessman  Chronic rhinitis Continue nasal congestion  This continues to be a difficult symptom for management for the patient given her autism and developmental delay.  Patient needs to continue to use nasal saline rinses to the best of her ability as well as nasal saline gel.  Patient is intolerant of nasal sprays and is prone to nosebleeds from these.  These have been stopped in the past.  Plan: Continue current regimen Schedule follow-up with Dr. Neldon Mc  OSA on CPAP Did not have time to fully address this on video visit today Patient's mother hung up video visit preemptively during discussion  Plan: Continue CPAP therapy  Cognitive developmental delay This continues to be a ongoing barrier for the patient.  Plan: Continue follow-up with primary care  Recurrent urinary tract infection Currently being managed by Dr. Amalia Hailey with [facility]  Personally contacted Dr. Amalia Hailey to discuss video visit today.  Dr. Amalia Hailey agrees with not proceeding forward with additional antibiotics.  He was updated regarding the patient's work-up.  We will route note to Dr. Amalia Hailey as Juluis Rainier.  Plan: Continue follow-up with Dr. Amalia Hailey as planned  Autism spectrum disorder Due to length of visit unable to fully assess today  This continues to be a barrier to the patient's care.  Thankfully her mother helps care for her.  Plan: Continue follow-up with primary care  Complex care coordination This was a difficult visit to complete virtually.  There continues to be ongoing issues with health literacy and understanding appropriate antibiotic stewardship.  Patient has had a history of recurrent bronchitis-like episodes as well as urinary tract infections.  Patient has had multiple rounds of  antibiotics over the last month without much symptom improvement.  Chest x-rays are clear.  Patient in no visible distress during the video visit.  Patient has had follow-ups with infectious disease with they were counseled on the appropriate use of antibiotics.  I believe giving the antibiotics as requested by the patient's mother: Ciprofloxacin or clindamycin, at greater than 21-day courses is clinically inappropriate and could be detrimental to the patient.  Patient's mother disagrees and reports that antibiotics are there "to help people".  I tried to explain that yes they were there to treat bacterial infections but the antibiotics that the patient has had already likely would have treated any sort of bacterial infection.  At this point in time we are running a significant risk of developing a adverse reaction to antibiotics such as C. difficile, tendinitis, or multidrug-resistant organisms as counseled previously by Dr. Graylon Good with infectious disease.  Plan: Continue follow-up with specialist Encouraged patient's mother to schedule an appointment with ear nose and throat Dr. Wilburn Cornelia for second opinion, she declined    Recurrent  infections Based off of video visit exam as well as emergency room note I do not believe the patient needs additional antibiotics at this time  Plan: Continue follow-up with Dr. Lucie Leather Continue follow-up with infectious disease Dr. Ilsa Iha    Follow Up Instructions:  Return in about 2 months (around 07/18/2019), or if symptoms worsen or fail to improve, for Follow up with Dr. Craige Cotta.    I discussed the assessment and treatment plan with the patient. The patient was provided an opportunity to ask questions and all were answered. The patient agreed with the plan and demonstrated an understanding of the instructions.   The patient was advised to call back or seek an in-person evaluation if the symptoms worsen or if the condition fails to improve as anticipated.  I  provided 60  minutes of non-face-to-face time during this encounter.   [name], NP

## 2019-05-18 NOTE — Patient Instructions (Signed)
You were seen today by Lori Ceo, NP  for:   It was a pleasure completing a video visit with you today.  I am sorry that it ended preemptively.  Once again to be clear I am very concerned about Lori Miles's antibiotic use.  I have personally contacted Dr. Logan Bores office and spoke with him directly stating that we did not treat the patient with antibiotics today.  He is aware and he agreed with this.  He also specifically stated that he does not want Lori Miles receiving ciprofloxacin unless it is coming through his office.  I will also route my message to Dr. Craige Cotta as well as Dr. Drue Second with infectious disease.  I do believe it is in the best interest of Lori Miles to follow-up with ear nose and throat - Dr. Annalee Genta.   I do hope Lori Miles starts to feel better.   Take Care,   Lori Miles  1. Bronchitis  Continue current respiratory medications  I believe that giving additional courses of antibiotics at this point in time would cause more harm than good  Keep scheduled follow-up with our office  Please contact ear nose and throat Dr. Thurmon Fair office regarding the nasal congestion or you can proceed forward with the second opinion that primary care is offered to Dr.Teohs office.   If symptoms worsen you can always present to an urgent care, primary care, our office for further evaluation  Follow Up:    Return in about 2 months (around 07/18/2019), or if symptoms worsen or fail to improve, for Follow up with Dr. Craige Cotta.   Please do your part to reduce the spread of COVID-19:      Reduce your risk of any infection  and COVID19 by using the similar precautions used for avoiding the common cold or flu:  Marland Kitchen Wash your hands often with soap and warm water for at least 20 seconds.  If soap and water are not readily available, use an alcohol-based hand sanitizer with at least 60% alcohol.  . If coughing or sneezing, cover your mouth and nose by coughing or sneezing into the elbow areas of your shirt or coat,  into a tissue or into your sleeve (not your hands). Lori Miles A MASK when in public  . Avoid shaking hands with others and consider head nods or verbal greetings only. . Avoid touching your eyes, nose, or mouth with unwashed hands.  . Avoid close contact with people who are sick. . Avoid places or events with large numbers of people in one location, like concerts or sporting events. . If you have some symptoms but not all symptoms, continue to monitor at home and seek medical attention if your symptoms worsen. . If you are having a medical emergency, call 911.   ADDITIONAL HEALTHCARE OPTIONS FOR PATIENTS  [facility] / e-Visit: [email or web address]         [facility]: [phone]  [facility]: [phone]                   [facility]: [phone]     It is flu season:   >>> Best ways to protect herself from the flu: Receive the yearly flu vaccine, practice good hand hygiene washing with soap and also using hand sanitizer when available, eat a nutritious meals, get adequate rest, hydrate appropriately   Please contact the office if your symptoms worsen or you have concerns that you are not improving.   Thank you for choosing  [facility] for your healthcare, and for allowing Lori Miles to partner with you on your healthcare journey. I am thankful to be able to provide care to you today.   Lori Headland FNP-C    Acute Bronchitis, Adult  Acute bronchitis is when air tubes in the lungs (bronchi) suddenly get swollen. The condition can make it hard for you to breathe. In adults, acute bronchitis usually goes away within 2 weeks. A cough caused by bronchitis may last up to 3 weeks. Smoking, allergies, and asthma can make the condition worse. What are the causes? This condition is caused by:  Cold and flu viruses. The most common cause of this condition is the virus that causes the common  cold.  Bacteria.  Substances that irritate the lungs, including: ? Smoke from cigarettes and other types of tobacco. ? Dust and pollen. ? Fumes from chemicals, gases, or burned fuel. ? Other materials that pollute indoor or outdoor air.  Close contact with someone who has acute bronchitis. What increases the risk? The following factors may make you more likely to develop this condition:  A weak body's defense system. This is also called the immune system.  Any condition that affects your lungs and breathing, such as asthma. What are the signs or symptoms? Symptoms of this condition include:  A cough.  Coughing up clear, yellow, or green mucus.  Wheezing.  Chest congestion.  Shortness of breath.  A fever.  Body aches.  Chills.  A sore throat. How is this treated? Acute bronchitis may go away over time without treatment. Your doctor may recommend:  Drinking more fluids.  Taking a medicine for a fever or cough.  Using a device that gets medicine into your lungs (inhaler).  Using a vaporizer or a humidifier. These are machines that add water or moisture in the air to help with coughing and poor breathing. Follow these instructions at home:  Activity  Get a lot of rest.  Avoid places where there are fumes from chemicals.  Return to your normal activities as told by your doctor. Ask your doctor what activities are safe for you. Lifestyle  Drink enough fluids to keep your pee (urine) pale yellow.  Do not drink alcohol.  Do not use any products that contain nicotine or tobacco, such as cigarettes, e-cigarettes, and chewing tobacco. If you need help quitting, ask your doctor. Be aware that: ? Your bronchitis will get worse if you smoke or breathe in other people's smoke (secondhand smoke). ? Your lungs will heal faster if you quit smoking. General instructions  Take over-the-counter and prescription medicines only as told by your doctor.  Use an inhaler,  cool mist vaporizer, or humidifier as told by your doctor.  Rinse your mouth often with salt water. To make salt water, dissolve -1 tsp (3-6 g) of salt in 1 cup (237 mL) of warm water.  Keep all follow-up visits as told by your doctor. This is important. How is this prevented? To lower your risk of getting this condition again:  Wash your hands often with soap and water. If soap and water are not available, use hand sanitizer.  Avoid contact with people who have cold symptoms.  Try not to touch your mouth, nose, or eyes with your hands.  Make sure to get the flu shot every year. Contact a doctor if:  Your symptoms do not get better in 2 weeks.  You vomit more than once or twice.  You have symptoms of loss of  fluid from your body (dehydration). These include: ? Dark urine. ? Dry skin or eyes. ? Increased thirst. ? Headaches. ? Confusion. ? Muscle cramps. Get help right away if:  You cough up blood.  You have chest pain.  You have very bad shortness of breath.  You become dehydrated.  You faint or keep feeling like you are going to faint.  You keep vomiting.  You have a very bad headache.  Your fever or chills get worse. These symptoms may be an emergency. Do not wait to see if the symptoms will go away. Get medical help right away. Call your local emergency services (911 in the [place] Do not drive yourself to the hospital. Summary  Acute bronchitis is when air tubes in the lungs (bronchi) suddenly get swollen. In adults, acute bronchitis usually goes away within 2 weeks.  Take over-the-counter and prescription medicines only as told by your doctor.  Drink enough fluid to keep your pee (urine) pale yellow.  Contact a doctor if your symptoms do not improve after 2 weeks of treatment.  Get help right away if you cough up blood, faint, or have chest pain or shortness of breath. This information is not intended to replace advice given to you by your health care  provider. Make sure you discuss any questions you have with your health care provider. Document Revised: 09/25/2018 Document Reviewed: 09/25/2018 Elsevier Patient Education  2020 Elsevier Inc.    Preventing Antibiotic Resistance Antibiotics are medicines that treat infections caused by bacteria. Antibiotic resistance means the medicine no longer works against the bacteria. Resistance can develop if you use antibiotics the wrong way. There are things you can do to help prevent resistance. What causes antibiotic resistance? Antibiotic resistance happens when bacteria come into contact with an antibiotic over and over again. Over time, the bacteria stop responding to the antibiotic that is used to kill them. How can this condition affect me? Antibiotic resistance can affect anyone. It makes infections:  Dangerous and even life-threatening.  More expensive to treat.  Difficult to treat, which may cause you to miss work or school. Preventing antibiotic resistance helps you stay healthy. What can increase my risk? You may have an increased risk for antibiotic resistance if you:  Have a chronic condition such as heart disease or diabetes.  Use antibiotics when they are not needed.  Are prescribed antibiotic medicine for a medical reason, but stop taking it earlier than prescribed. What actions can I take to prevent antibiotic resistance? To help prevent antibiotic resistance:  Work closely with your health care provider.  Stay up to date on vaccinations. Many vaccines prevent illnesses that are treated with antibiotics.  Understand when antibiotics are needed and when they are not needed.  Do not ask for an antibiotic prescription if you have been diagnosed with an illness that is caused by a virus. That will not make your illness go away faster. Ear infections, sinus infections, vomiting, and bronchitis are often caused by viruses.  Do not take antibiotics that are left over from a  previous prescription.  Do not take antibiotics that are prescribed for someone else.  If you are prescribed an antibiotic: ? Take your antibiotic medicine as told by your health care provider. Do not stop taking the antibiotic even if you start to feel better. ? Do not skip doses. Antibiotics work best when taken as prescribed. ? If you have been taking it for more than 10 days, ask your health care provider or  pharmacist if you should keep taking it. ? Do not save unused antibiotics for use at a later date. Get rid of unused medicine as told by your health care provider or pharmacist. What actions can I take to prevent infection? You can get an antibiotic-resistant infection even if you take steps to prevent antibiotic resistance or have not recently been exposed to antibiotics. To reduce your risk of infection:  Wash your hands often with soap and water. This is especially important if you are in a public place. If soap and water are not available, use a hand sanitizer.  Prepare food safely. Make sure you: ? Wash fruits and vegetables before you eat them. ? Keep raw meat, dairy products, and leftovers in the refrigerator. ? Keep raw meat and eggs from touching other foods. ? Disinfect all food preparation surfaces and utensils after use.  Avoid touching your eyes, nose, and mouth with unwashed hands.  Avoid being around people who are sick.  Take precautions, such as using condoms, to prevent sexually transmitted infections (STIs). Where can I get more information? To learn more about antibiotic resistance, visit the Centers for Disease Control and Prevention at TonerPromos.nocdc.gov Contact a health care provider if:  You have persistent diarrhea after taking antibiotics. Summary  Infections caused by resistant bacteria can be dangerous and even life-threatening for some people.  Take your antibiotic medicine as told by your health care provider. Do not stop taking the antibiotic even if you  start to feel better.  Do not take antibiotics that are prescribed for someone else.  Do not take antibiotics for an illness that is caused by a virus. That will not make your illness go away faster. Taking antibiotics when they are not needed can lead to antibiotic resistance.  Take steps to prevent infections. These include washing hands often with soap and water, preparing food safely, and avoiding contact with people who are sick. This information is not intended to replace advice given to you by your health care provider. Make sure you discuss any questions you have with your health care provider. Document Revised: 06/26/2018 Document Reviewed: 03/16/2018 Elsevier Patient Education  2020 Elsevier Inc.    Preventing MDRO Infections Multidrug-resistant organisms (MDRO) are bacteria that have become resistant to antibiotic medicines. This means that antibiotics cannot stop the bacteria from growing. Types of MDRO include:  Methicillin/oxacillin-resistant Staphylococcus aureus (MRSA).  Vancomycin-resistant enterococci (VRE).  Extended-spectrum beta-lactamases (ESBLs).  Clostridioides difficile (C. difficile).  Multi-drug resistant tuberculosis (MDR TB).  Penicillin-resistant Streptococcus pneumonia (PRSP).  Carbapenem-resistant enterobacteriaceae (CRE). Everyone has good and bad bacteria in his or her body, such as in the stomach or on the skin. Good bacteria help protect the body from infection. However, when you take an antibiotic medicine, it may kill both the good and bad bacteria, which then allows medicine-resistant bacteria to grow. Infections caused by MDRO can be difficult to treat. It is important to follow certain safety measures to prevent the spread of MDRO. What increases the risk? You are more likely to develop a MDRO infection if:  You were treated with an antibiotic medicine for a long time.  You have been in the hospital for a long time.  You recently had  major surgery, such as chest or abdominal surgery.  You have a weakened disease-fighting (immune) system. This may be caused by an illness, long-term (chronic) condition, or medical treatment.  You have a catheter that has stayed in for a long time, such as a urinary catheter  or vascular access device. MDRO are usually spread through hands that have the germs (contaminated hands). MDRO may also be spread through:  Medical equipment that was not cleaned properly.  Shared personal items, such as razors or towels.  Contaminated surfaces, such as a bathroom counter or sink.  Undercooked or raw meat. Animals treated with antibiotics may have medicine-resistant bacteria.  Water or vegetables contaminated with animal feces. How is this treated? MDRO infections are usually treated with antibiotic medicines that are taken by mouth (oral antibiotics). Treatment depends on the type of MDRO you have. MDRO infections are difficult to treat, and you may need to be hospitalized. Depending on how severe your infection is, you may need other treatments such as:  IV antibiotics.  High-dose antibiotics.  More than one antibiotic.  Antibiotics that you breathe in (inhaled antibiotics), if you have pneumonia.  A machine to help you breathe (ventilator). What actions can be taken? What hospitals are doing:  Encouraging staff, patients, and visitors to wash hands often with soap and warm water, and to use hand sanitizer when soap and water are not available.  Taking extra steps to prevent infection (contact precautions) with patients who are infected with MDRO. Contact precautions include: ? Having all healthcare workers and visitors wash their hands before and after leaving the room. ? Having all healthcare workers and visitors wear a gown and gloves while in the room, and asking them throw away the gown and gloves before leaving the room.  Prescribing antibiotic medicines only when they are needed.  Over-prescribing antibiotic medicines can help the spread of MDRO.  Keeping patients with MDRO in a room by themselves (isolation) or placing them in rooms with other patients who are already infected with MDRO.  Carefully cleaning and disinfecting hospital rooms and equipment.  Improving communication about which patients are infected with MDRO or who have been infected in the past.  Closely monitoring and tracking MDRO infections.  Educating staff and patients about the signs of infection. What you can do:   Wash your hands regularly with soap and warm water. If soap and water are not available, use hand sanitizer.  Take antibiotic medicines only when needed. Do not take antibiotic medicines for viral infections such as the common cold.  If you were prescribed an antibiotic medicine, take it only as told by your health care provider. Do not stop taking the antibiotic even if you start to feel better.  Do not share antibiotic medicines with others.  Do not share personal items, such as bath towels or razors.  If you have a catheter, care for it as told by your health care provider.  Keep all wounds clean and dry. Follow your health care provider's instructions about how to care for any wounds you have.  Practice safe food handling. This includes: ? Washing all fruits and vegetables. ? Washing all utensils that have come in contact with raw meat. ? Keeping a separate cutting board for raw meat. ? Cooking meat thoroughly. All poultry (including chicken and [place]) should be cooked to at least 165F (74C). Ground beef, pork, or lamb should be cooked to at least 160F (71C), and whole beef, pork, or lamb should be cooked to at least 145F (63C). ? Washing your hands with soap and warm water before and after cooking, especially after handling raw meat.  Clean and disinfect surfaces that are touched often. Use solutions or products that contain bleach. Do this on a regular  basis. What visitors  can do: Although it is rare, visitors can be infected with MDRO. To prevent this, visitors should:  Wash their hands with soap and warm water before and after visiting you. If soap and water are not available, they can use hand sanitizer.  Ask your health care provider if they need to wear gloves and gowns when they visit you. If they do need to wear these, make sure they throw away the gloves and gowns before they leave your room. Where to find more information You can find more information about preventing MDRO infections from:  Centers for Disease Control and Prevention: eBuzzed.gl Summary  MDRO are bacteria that have become resistant to antibiotic medicines.  You are more likely to be infected with a MDRO if you have been taking an antibiotic medicine for a long time or have been hospitalized for a long time.  If you were prescribed an antibiotic medicine, take it exactly as told by your health care provider.  Wash your hands regularly with soap and warm water. If soap and water are not available, use hand sanitizer.  Ask your health care provider whether your visitors need to wear gloves and gowns when visiting you. This information is not intended to replace advice given to you by your health care provider. Make sure you discuss any questions you have with your health care provider. Document Revised: 06/26/2018 Document Reviewed: 07/19/2016 Elsevier Patient Education  [place].

## 2019-05-18 NOTE — Assessment & Plan Note (Signed)
Currently being managed by Dr. Logan Bores with [facility]  Personally contacted Dr. Logan Bores to discuss video visit today.  Dr. Logan Bores agrees with not proceeding forward with additional antibiotics.  He was updated regarding the patient's work-up.  We will route note to Dr. Logan Bores as Lori Miles.  Plan: Continue follow-up with Dr. Logan Bores as planned

## 2019-05-18 NOTE — Progress Notes (Signed)
Call to patient mother to give information.  No antibiotics needed per Dr Lucie Leather.  Mom is waiting for her ride to pick her up to pick up prescription for prednisone.  Mom states that if she does not feel any better, she will call us back. Mom verbalized understanding, call ended.

## 2019-05-19 ENCOUNTER — Encounter: Payer: Self-pay | Admitting: Allergy & Immunology

## 2019-05-19 NOTE — Progress Notes (Signed)
Reviewed and agree with assessment/plan.   Jennika Ringgold, MD [facility] 03/13/2016, 12:24 PM Pager:  [phone]  

## 2019-05-19 NOTE — Telephone Encounter (Signed)
Pt had a video visit with [name] on 05/18/2019. Message will be closed.

## 2019-05-20 ENCOUNTER — Telehealth: Payer: Self-pay

## 2019-05-20 DIAGNOSIS — I872 Venous insufficiency (chronic) (peripheral): Secondary | ICD-10-CM

## 2019-05-20 DIAGNOSIS — Z7189 Other specified counseling: Secondary | ICD-10-CM

## 2019-05-20 DIAGNOSIS — F819 Developmental disorder of scholastic skills, unspecified: Secondary | ICD-10-CM

## 2019-05-20 DIAGNOSIS — F840 Autistic disorder: Secondary | ICD-10-CM

## 2019-05-20 NOTE — Telephone Encounter (Signed)
I will make a referral to [facility].   Laurell Josephs, RN

## 2019-05-20 NOTE — Telephone Encounter (Signed)
-----   Message from Judyann Munson, MD sent at 05/20/2019  3:52 PM EST ----- Is there someone -- that can help with case management/advocacy for the patient. I think the mom has poor health literacy but obviously she knows her daughter the best. She has multiple visits to various docs and ed/uc to get antibiotics (which may or may not be appropriate)  I guess I am thinking someone like amber. Or someone who helps those navigate the system for high medical users?

## 2019-05-26 ENCOUNTER — Telehealth: Payer: Self-pay | Admitting: Acute Care

## 2019-05-26 NOTE — Telephone Encounter (Signed)
Spoke with patient's mother. She stated that she wanted to call back to say she took Lori Miles to another clinic on the same day as her video visit on 05/18/19 and she was prescribed an antibiotic. She could not remember the doctors name but after reviewing the amoxicillin RX in the medication reconciliation tab, it was Dr. Salli Real with [facility].   While on the phone, she stated that she was very displeased with the video visit on 05/18/19 and that she could not get a word in with [place]. She had other family members around her during the visit and they were upset as well. She stated that after the visit, Lori Miles became upset as well and it took her a while to calm down.   She wants Dr. Craige Cotta to be aware of this interaction and she is considering taking Lori Miles to another office since no one will prescribe an antibiotic when [place] needs one. She wants to me route this message to Dr. Craige Cotta so he is aware. I asked her if she needed anything today, she denied needing anything today.   Will route to Dr. Craige Cotta as she requested so he is aware.

## 2019-05-28 ENCOUNTER — Telehealth: Payer: Self-pay

## 2019-05-28 NOTE — Telephone Encounter (Signed)
Spoke with patient's mom who requested to cancel her appointment with Dr. Daiva Eves on the 17th. Patient is reportedly undergoing a procedure and she'd like to get her settled before attending another appointment. Was concerned that she wouldn't be able to see Dr. Daiva Eves but let her know that when she reschedules to request him and they'll make sure she sees him.  Kyannah Climer Loyola Mast, RN

## 2019-05-30 NOTE — Telephone Encounter (Signed)
If she would prefer to have Kammi getting medical care elsewhere, then please ask her to let us know where she would like her medical records forwarded to.

## 2019-05-31 NOTE — Telephone Encounter (Signed)
Spoke with pt's mother and advised her of message  From Dr. Craige Cotta. She didn't mean for her to go somewhere permanently but just wanted to let Dr. Craige Cotta know about her experience. She just wants [name] to know when she is weak and sick like that she does need an abx and thought she needed it that day she spoke with [name]. She states [place] is doing well since she took the abx and would like for [name] to understand when she needs an abx. I explained understanding and apologized for the inconvenience and I was glad that Lori Miles was doing well.

## 2019-06-01 ENCOUNTER — Encounter: Payer: Self-pay | Admitting: Physical Therapy

## 2019-06-01 ENCOUNTER — Ambulatory Visit: Payer: Medicare Other | Attending: Family | Admitting: Physical Therapy

## 2019-06-01 ENCOUNTER — Other Ambulatory Visit: Payer: Self-pay

## 2019-06-01 DIAGNOSIS — R2681 Unsteadiness on feet: Secondary | ICD-10-CM | POA: Insufficient documentation

## 2019-06-01 DIAGNOSIS — M6281 Muscle weakness (generalized): Secondary | ICD-10-CM | POA: Diagnosis present

## 2019-06-01 DIAGNOSIS — R2689 Other abnormalities of gait and mobility: Secondary | ICD-10-CM

## 2019-06-01 NOTE — Telephone Encounter (Signed)
Referral received, reviewed the chart and I wonder if [facility] Case management and/or SW can assist her. I am not sure of the process so I will reach out to [facility] with [facility] for guidance.

## 2019-06-02 ENCOUNTER — Encounter: Payer: Self-pay | Admitting: Physical Therapy

## 2019-06-02 ENCOUNTER — Ambulatory Visit: Payer: Medicare Other | Admitting: Infectious Disease

## 2019-06-02 NOTE — Therapy (Signed)
[facility] [address] [place], [place], [address] Phone: [phone]   Fax:  [phone]  Physical Therapy Evaluation  Patient Details  Name: Lori Miles MRN: [id number] Date of Birth: 08-Jun-1995 Referring Provider (PT): Elveria Rising, NP   Encounter Date: 06/01/2019  PT End of Session - 06/02/19 1620    Visit Number  1    Number of Visits  7   eval + 6 visits   Date for PT Re-Evaluation  07/16/19    Authorization Type  UHC Medicare    Authorization Time Period  06-01-19 - 08-30-19    PT Start Time  0935    PT Stop Time  1017    PT Time Calculation (min)  42 min    Activity Tolerance  Patient tolerated treatment well    Behavior During Therapy  [facility] for tasks assessed/performed       Past Medical History:  Diagnosis Date  . Asthma   . Autism    mom states no actual dx, but admits she has some symptoms  . Bacteriuria   . Chronic constipation   . Cognitive developmental delay   . Constipation   . Disruptive behavior disorder   . Dyspnea    if she walks much  . Family history of adverse reaction to anesthesia     "hives"-? if anesthesia did take antibiotic  . Frequency-urgency syndrome   . Gait disorder    ORGANIC PER NERUOLGIST NOTE (DR HICKLING)  . 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 GERD (gastroesophageal reflux disease)   . Gout   . H/O sinus tachycardia   . History of acute respiratory failure 02/14/2015   SECONDARY TO CAP  . History of recurrent UTIs   . Interstitial cystitis   . Moderate intellectual disability   . OSA (obstructive sleep apnea)    no machine yet   . Partial epilepsy with impairment of consciousness (HCC)    FOLLOWED BY DR HICKLING  . Pneumonia 2    4 times this year ( 2020)  . PONV (postoperative nausea and vomiting)   . Seizure (HCC)    most recent sz " at the end of summer"-last sz years ago per mother  . Urinary incontinence      Past Surgical History:  Procedure Laterality Date  . CYSTOSCOPY N/A 06/03/2017   Procedure: CYSTOSCOPY WITH EXAM UNDER ANESTHESIA;  Surgeon: Jerilee Field, MD;  Location: [facility];  Service: Urology;  Laterality: N/A;  . CYSTOSCOPY  2020  . CYSTOSCOPY WITH HYDRODISTENSION AND BIOPSY  04/14/2012   Procedure: CYSTOSCOPY/BIOPSY/HYDRODISTENSION;  Surgeon: Lindaann Slough, MD;  Location: [facility] [place];  Service: Urology;;  instillation of marcaine and pyridium  . EUA/  PAP SMEAR/  BREAST EXAM  03-12-2016   dr Erin Fulling [facility]  . EUA/ VAGINOSCOPY/ COLPOSCOPY FO THE HYMENAL RING  10-04-2009    dr Tamela Oddi  [facility]  . TONSILLECTOMY      There were no vitals filed for this visit.   Subjective Assessment - 06/01/19 0941    Subjective  Pt presents for PT eval accompanied by her mother; pt is amb. without device    Pertinent History  Autism;  Developmental Delay; Generalized convulsive epilepsy; seizures; Recurrent falls    Patient Stated Goals  improve strength; improve balance; try to improve breathing function (I informed pt's mother that we do not specifically address this problem at [facility] but cardiovascular exercise would be included with exercises)  Currently in Pain?  Yes    Pain Score  --   unable to describe or rate the pain - pt points to her Lt thigh/knee area   Pain Location  Leg    Pain Orientation  Left    Pain Descriptors / Indicators  --   pt unable to state description of pain   Pain Type  Chronic pain    Pain Onset  More than a month ago    Pain Frequency  --   unable to accurately assess due to pt's cognitive deficits   Aggravating Factors   unknown    Pain Relieving Factors  unknown         [facility] PT Assessment - 06/02/19 0001      Assessment   Medical Diagnosis  Recurrent falls; Gait instability:  Epilepsy;  Autism; Developmental Delay    Referring Provider (PT)  Elveria Rising, NP    Onset Date/Surgical Date  --   Referral  date 03-24-19   Next MD Visit  06-11-19 with Elveria Rising, NP    Prior Therapy  Pt had PT at this facility from Jan. - March 2020 and then resumed in May - Sept. 2020      Precautions   Precautions  Fall      Balance Screen   Has the patient fallen in the past 6 months  No    Has the patient had a decrease in activity level because of a fear of falling?   No    Is the patient reluctant to leave their home because of a fear of falling?   No      Prior Function   Level of Independence  Independent with household mobility without device;Needs assistance with ADLs    Comments  pt obtained a rollator in Fall 2020 for assistance with community ambulation      ROM / Strength   AROM / PROM / Strength  Strength      Strength   Overall Strength Comments  unable to accurately assess due to cognitive deficits but grossly WFL's bil. LE's      Ambulation/Gait   Ambulation/Gait  Yes    Ambulation/Gait Assistance  4: Min guard    Ambulation Distance (Feet)  115 Feet    Assistive device  None   pt did not bring rollator to PT evaluation   Gait Pattern  Step-through pattern   LLE is internally rotated   Ambulation Surface  Level;Indoor      Standardized Balance Assessment   Standardized Balance Assessment  Timed Up and Go Test      Timed Up and Go Test   Normal TUG (seconds)  16.28                Objective measurements completed on examination: See above findings.              PT Education - 06/02/19 1619    Education Details  eval results    Person(s) Educated  Patient;Parent(s)    Methods  Explanation    Comprehension  Verbalized understanding          PT Long Term Goals - 06/02/19 1654      PT LONG TERM GOAL #1   Title  Pt. will amb. 500' on flat, even surface without LOB.      Time  6    Period  Weeks    Status  New    Target Date  07/16/19  PT LONG TERM GOAL #2   Title  Pt will perform at least 10" on recumbent bike nonstop for improved  endurance/activity tolerance.    Time  6    Period  Weeks    Status  New    Target Date  07/16/19      PT LONG TERM GOAL #3   Title  Mother will verbalize understanding of HEP to assist with LE strengthening.      Time  6    Period  Weeks    Status  New    Target Date  07/16/19      PT LONG TERM GOAL #4   Title  Perform sit to stand 5 reps without UE support from mat to demo increased bil. LE strength.    Time  6    Period  Weeks    Status  New    Target Date  07/16/19             Plan - 06/02/19 1622    Clinical Impression Statement  Pt is a 24 yr old lady with developmental delay, autism, gait disorder, and mild weakness with LLE more impaired than RLE.  Pt exhibits toeing in/internal rotation of LLE in standing and during gait. Pt's TUG score is 16.28 secs, indicative of mild fall risk.  Pt would benefit from PT to address gait deviations, LE weakness, and decreased endurance.    Personal Factors and Comorbidities  Behavior Pattern;Past/Current Experience;Fitness;Comorbidity 2;Time since onset of injury/illness/exacerbation    Examination-Activity Limitations  Stand;Locomotion Level;Bend;Squat;Stairs    Examination-Participation Restrictions  Community Activity;Interpersonal Relationship;Shop    Stability/Clinical Decision Making  Stable/Uncomplicated    Clinical Decision Making  Low    Rehab Potential  Good    PT Frequency  1x / week    PT Duration  6 weeks    PT Treatment/Interventions  ADLs/Self Care Home Management;Balance training;Gait training;Neuromuscular re-education;Stair training;Patient/family education;Therapeutic activities;Therapeutic exercise    PT Next Visit Plan  gait train, balance exercises, SciFit    Consulted and Agree with Plan of Care  Patient;Family member/caregiver    Family Member Consulted  [name]       Patient will benefit from skilled therapeutic intervention in order to improve the following deficits and impairments:  Abnormal  gait, Decreased balance, Decreased activity tolerance, Decreased strength, Pain, Decreased cognition  Visit Diagnosis: Other abnormalities of gait and mobility - Plan: PT plan of care cert/re-cert  Muscle weakness (generalized) - Plan: PT plan of care cert/re-cert  Unsteadiness on feet - Plan: PT plan of care cert/re-cert     Problem List Patient Active Problem List   Diagnosis Date Noted  . Complex care coordination 05/18/2019  . Screening for cervical cancer 02/26/2019  . Glucosuria 06/01/2018  . 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 Not well controlled moderate persistent asthma 04/29/2018  . Gastroesophageal reflux disease 04/29/2018  . Recurrent infections 04/29/2018  . Dysuria 02/20/2018  . Chronic rhinitis 04/03/2017  . Recurrent falls 03/21/2017  . Weakness 03/21/2017  . Venous insufficiency 03/21/2017  . OSA on CPAP 09/30/2016  . Excessive daytime sleepiness 03/05/2016  . Bronchitis 05/05/2015  . 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 Cough variant asthma vs UACS/ vcd  03/15/2015  . Autism spectrum disorder 02/14/2015  . Nausea with vomiting 02/14/2015  . Obesity, morbid (HCC) 12/13/2014  . 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 Acanthosis nigricans 11/14/2014  .  Toeing-in 08/29/2014  . Generalized convulsive epilepsy ([employer]) 09/28/2012  . Partial epilepsy with impairment of consciousness ([place]) 09/28/2012  . 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 Recurrent urinary tract infection 08/01/2011  . Asthma 08/01/2011  . Chronic constipation 08/01/2011  . Contraception 08/01/2011    Alda Lea, PT 06/02/2019, 4:59 PM  [facility] [address] [place], [place], [address] Phone: [phone]   Fax:  [phone]  Name: Lori Miles MRN: [id number] Date of Birth:  Sep 30, 1995

## 2019-06-03 ENCOUNTER — Other Ambulatory Visit: Payer: Self-pay | Admitting: Allergy and Immunology

## 2019-06-08 ENCOUNTER — Other Ambulatory Visit: Payer: Self-pay

## 2019-06-08 ENCOUNTER — Ambulatory Visit: Payer: Medicare Other | Admitting: Physical Therapy

## 2019-06-08 ENCOUNTER — Encounter: Payer: Self-pay | Admitting: Physical Therapy

## 2019-06-08 DIAGNOSIS — M6281 Muscle weakness (generalized): Secondary | ICD-10-CM

## 2019-06-08 DIAGNOSIS — R2681 Unsteadiness on feet: Secondary | ICD-10-CM

## 2019-06-08 DIAGNOSIS — R2689 Other abnormalities of gait and mobility: Secondary | ICD-10-CM

## 2019-06-09 NOTE — Therapy (Signed)
[facility] [address] [place], [place], [address] Phone: [phone]   Fax:  [phone]  Physical Therapy Treatment  Patient Details  Name: Lori Miles MRN: [id number] Date of Birth: 07/09/1995 Referring Provider (PT): Elveria Rising, NP   Encounter Date: 06/08/2019  PT End of Session - 06/09/19 1747    Visit Number  2    Number of Visits  7   eval + 6 visits   Date for PT Re-Evaluation  07/16/19    Authorization Type  UHC Medicare    Authorization Time Period  06-01-19 - 08-30-19    PT Start Time  0734   pt arrived early for scheduled 8:00 appt   PT Stop Time  0822    PT Time Calculation (min)  48 min    Activity Tolerance  Patient tolerated treatment well    Behavior During Therapy  [facility] for tasks assessed/performed       Past Medical History:  Diagnosis Date  . Asthma   . Autism    mom states no actual dx, but admits she has some symptoms  . Bacteriuria   . Chronic constipation   . Cognitive developmental delay   . Constipation   . Disruptive behavior disorder   . Dyspnea    if she walks much  . Family history of adverse reaction to anesthesia     "hives"-? if anesthesia did take antibiotic  . Frequency-urgency syndrome   . Gait disorder    ORGANIC PER NERUOLGIST NOTE (DR HICKLING)  . 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 GERD (gastroesophageal reflux disease)   . Gout   . H/O sinus tachycardia   . History of acute respiratory failure 02/14/2015   SECONDARY TO CAP  . History of recurrent UTIs   . Interstitial cystitis   . Moderate intellectual disability   . OSA (obstructive sleep apnea)    no machine yet   . Partial epilepsy with impairment of consciousness (HCC)    FOLLOWED BY DR HICKLING  . Pneumonia 2    4 times this year ( 2020)  . PONV (postoperative nausea and vomiting)   . Seizure (HCC)    most recent sz " at the end of summer"-last sz years ago  per mother  . Urinary incontinence     Past Surgical History:  Procedure Laterality Date  . CYSTOSCOPY N/A 06/03/2017   Procedure: CYSTOSCOPY WITH EXAM UNDER ANESTHESIA;  Surgeon: Jerilee Field, MD;  Location: [facility];  Service: Urology;  Laterality: N/A;  . CYSTOSCOPY  2020  . CYSTOSCOPY WITH HYDRODISTENSION AND BIOPSY  04/14/2012   Procedure: CYSTOSCOPY/BIOPSY/HYDRODISTENSION;  Surgeon: Lindaann Slough, MD;  Location: [facility] [place];  Service: Urology;;  instillation of marcaine and pyridium  . EUA/  PAP SMEAR/  BREAST EXAM  03-12-2016   dr Erin Fulling [facility]  . EUA/ VAGINOSCOPY/ COLPOSCOPY FO THE HYMENAL RING  10-04-2009    dr Tamela Oddi  [facility]  . TONSILLECTOMY      There were no vitals filed for this visit.                    [facility] Adult PT Treatment/Exercise - 06/09/19 0001      Ambulation/Gait   Ambulation/Gait  Yes    Ambulation/Gait Assistance  4: Min guard    Ambulation Distance (Feet)  115 Feet    Assistive device  None    Gait Pattern  Step-through pattern  Ambulation Surface  Level;Indoor      Exercises   Exercises  Knee/Hip      Knee/Hip Exercises: Aerobic   Recumbent Bike  SciFit level 2.5 x 6" with intermittent rest breaks - w/UE's & LE's      Knee/Hip Exercises: Machines for Strengthening   Cybex Leg Press  bil. LE's 40# 2 sets 10 reps       Knee/Hip Exercises: Standing   Forward Step Up  Left;Hand Hold: 2;Step Height: 6";Right;1 set;5 reps   3 reps LLE, then pt quit; RLE 5 reps     Pt performed LAQ with 2# weight 10 reps on each leg - pt seated on mat   Standing - no weight used - marching in place 10 reps each with bil. UE support on back of chair  Balance Exercises - 06/09/19 1753      Balance Exercises: Standing   Rockerboard  Anterior/posterior;10 seconds;UE support   attempted - pt stopped due to LLE weakness and tremors      Pt performed ladder activity 4 reps with min hand held assist to  increase step length and to improve SLS on each leg  [place]one taps to 4 cones with min HHA      PT Long Term Goals - 06/09/19 1751      PT LONG TERM GOAL #1   Title  Pt. will amb. 500' on flat, even surface without LOB.      Time  6    Period  Weeks    Status  New      PT LONG TERM GOAL #2   Title  Pt will perform at least 10" on recumbent bike nonstop for improved endurance/activity tolerance.    Time  6    Period  Weeks    Status  New      PT LONG TERM GOAL #3   Title  Mother will verbalize understanding of HEP to assist with LE strengthening.      Time  6    Period  Weeks    Status  New      PT LONG TERM GOAL #4   Title  Perform sit to stand 5 reps without UE support from mat to demo increased bil. LE strength.    Time  6    Period  Weeks    Status  New            Plan - 06/09/19 1748    Clinical Impression Statement  Pt exhibited gait deviations with LLE nearly buckling when pt stood up from chair in lobby to walk back to clinic gym; pt performed 3 reps of Lt step ups with bil. UE support on rails and then stopped this exercise due to c/o LLE weakness. Rockerboard activity attempted but pt had tremors in bil. LE's and pt discontinued this exercise.  Pt able to continue with other activities and exercises without LLE bucking.    Personal Factors and Comorbidities  Behavior Pattern;Past/Current Experience;Fitness;Comorbidity 2;Time since onset of injury/illness/exacerbation    Examination-Activity Limitations  Stand;Locomotion Level;Bend;Squat;Stairs    Examination-Participation Restrictions  Community Activity;Interpersonal Relationship;Shop    Stability/Clinical Decision Making  Stable/Uncomplicated    Rehab Potential  Good    PT Frequency  1x / week    PT Duration  6 weeks    PT Treatment/Interventions  ADLs/Self Care Home Management;Balance training;Gait training;Neuromuscular re-education;Stair training;Patient/family education;Therapeutic activities;Therapeutic  exercise    PT Next Visit Plan  gait train, balance exercises, SciFit  Consulted and Agree with Plan of Care  Patient;Family member/caregiver    Family Member Consulted  mother [name]       Patient will benefit from skilled therapeutic intervention in order to improve the following deficits and impairments:  Abnormal gait, Decreased balance, Decreased activity tolerance, Decreased strength, Pain, Decreased cognition  Visit Diagnosis: Other abnormalities of gait and mobility  Muscle weakness (generalized)  Unsteadiness on feet     Problem List Patient Active Problem List   Diagnosis Date Noted  . Complex care coordination 05/18/2019  . Screening for cervical cancer 02/26/2019  . Glucosuria 06/01/2018  . Steroid-induced diabetes ([place]) 06/01/2018  . Not well controlled moderate persistent asthma 04/29/2018  . Gastroesophageal reflux disease 04/29/2018  . Recurrent infections 04/29/2018  . Dysuria 02/20/2018  . Chronic rhinitis 04/03/2017  . Recurrent falls 03/21/2017  . Weakness 03/21/2017  . Venous insufficiency 03/21/2017  . OSA on CPAP 09/30/2016  . Excessive daytime sleepiness 03/05/2016  . Bronchitis 05/05/2015  . 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 Cough variant asthma vs UACS/ vcd  03/15/2015  . Autism spectrum disorder 02/14/2015  . Nausea with vomiting 02/14/2015  . Obesity, morbid ([place]) 12/13/2014  . 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 Acanthosis nigricans 11/14/2014  . Toeing-in 08/29/2014  . Generalized convulsive epilepsy ([place]) 09/28/2012  . Partial epilepsy with impairment of consciousness ([place]) 09/28/2012  . 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 Recurrent urinary tract infection 08/01/2011  . Asthma 08/01/2011  . Chronic constipation 08/01/2011  . Contraception 08/01/2011    Alda Lea, [place] 06/09/2019, 5:54 PM  [facility] [address] [place], [place], [address] Phone: [phone]   Fax:  [phone]  Name: Lori Miles MRN: [id number] Date of Birth: 1996-01-17

## 2019-06-10 ENCOUNTER — Other Ambulatory Visit: Payer: Self-pay | Admitting: Allergy and Immunology

## 2019-06-11 ENCOUNTER — Other Ambulatory Visit: Payer: Self-pay

## 2019-06-11 ENCOUNTER — Encounter (INDEPENDENT_AMBULATORY_CARE_PROVIDER_SITE_OTHER): Payer: Self-pay | Admitting: Family

## 2019-06-11 ENCOUNTER — Ambulatory Visit (INDEPENDENT_AMBULATORY_CARE_PROVIDER_SITE_OTHER): Payer: Medicare Other | Admitting: Family

## 2019-06-11 VITALS — BP 108/62 | HR 78 | Ht <= 58 in | Wt 190.8 lb

## 2019-06-11 DIAGNOSIS — G40209 Localization-related (focal) (partial) symptomatic epilepsy and epileptic syndromes with complex partial seizures, not intractable, without status epilepticus: Secondary | ICD-10-CM

## 2019-06-11 DIAGNOSIS — G478 Other sleep disorders: Secondary | ICD-10-CM | POA: Diagnosis not present

## 2019-06-11 DIAGNOSIS — F5105 Insomnia due to other mental disorder: Secondary | ICD-10-CM

## 2019-06-11 DIAGNOSIS — F819 Developmental disorder of scholastic skills, unspecified: Secondary | ICD-10-CM

## 2019-06-11 DIAGNOSIS — G40309 Generalized idiopathic epilepsy and epileptic syndromes, not intractable, without status epilepticus: Secondary | ICD-10-CM | POA: Diagnosis not present

## 2019-06-11 DIAGNOSIS — F840 Autistic disorder: Secondary | ICD-10-CM

## 2019-06-11 NOTE — Progress Notes (Signed)
[name]   MRN:  [id number]  05-22-1995   Provider: Elveria Rising NP-C Location of Care: [facility]  Visit type: Follow Up  Last visit: 05/26/2018  Referral source: Clementeen Graham, PA-C History from: Epic chart, Mom  Brief history:  Copied from previous record: Generalized and convulsive epilepsy, intellectual disability, autism spectrum disorder, disordered sleep and gait disorder  Today's concerns: Mom reports today that Lori Miles continues to have frequent urinary tract infections and has recently undergone work up that revealed bilateral ureteroceles. She has recently started taking daily prophylactic Keflex. She has a follow up appointment to discuss possible surgical repair. Lori Miles also has frequent respiratory infections and Mom notes that she receives frequent antibiotics and Prednisone as treatment.   Mom reports that Delores has been having seizures "nearly every day" over the last couple of months. She brought a video showing Londen nodding her head up, eyes rolled back and her tongue was twisted. She was not responsive to efforts to get her attention. Mom said that the episodes last for a few minutes up to 20 minutes in short back to back sessions. Mom denies missed doses of medication. She says that Lori Miles continues to have significant difficulty with sleep and can often be awake for 4-5 hours during the night. She has not been to ER for these events.   Lori Miles has ongoing problems with gait and receives physical therapy. Mom says that she was also recently diagnosed with Vitamin D deficiency. She has not fallen but has had some stumbling and near falls. Mom plans to get a gait belt to use when [place] is walking.   Lori Miles has been otherwise generally healthy since she was last seen. She has gained considerable weight over time and Mom says that she has been trying to modfiy her diet. Mom has no other health concerns for [facility] today other  than previously mentioned.    Review of systems: Please see HPI for neurologic and other pertinent review of systems. Otherwise all other systems were reviewed and were negative.  Problem List: Patient Active Problem List   Diagnosis Date Noted  . Complex care coordination 05/18/2019  . Screening for cervical cancer 02/26/2019  . Glucosuria 06/01/2018  . 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 Not well controlled moderate persistent asthma 04/29/2018  . Gastroesophageal reflux disease 04/29/2018  . Recurrent infections 04/29/2018  . Dysuria 02/20/2018  . Chronic rhinitis 04/03/2017  . Recurrent falls 03/21/2017  . Weakness 03/21/2017  . Venous insufficiency 03/21/2017  . OSA on CPAP 09/30/2016  . Excessive daytime sleepiness 03/05/2016  . Bronchitis 05/05/2015  . 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 Cough variant asthma vs UACS/ vcd  03/15/2015  . Autism spectrum disorder 02/14/2015  . Nausea with vomiting 02/14/2015  . Obesity, morbid (HCC) 12/13/2014  . 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 Acanthosis nigricans 11/14/2014  . Toeing-in 08/29/2014  . 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 Recurrent urinary tract infection 08/01/2011  . Asthma 08/01/2011  . Chronic constipation 08/01/2011  . Contraception 08/01/2011     Past Medical History:  Diagnosis Date  . Asthma   . Autism    mom states no actual dx, but admits she has some symptoms  . Bacteriuria   . Chronic constipation   . Cognitive developmental delay   .  Constipation   . Disruptive behavior disorder   . Dyspnea    if she walks much  . Family history of adverse reaction to anesthesia     "hives"-? if anesthesia did take antibiotic  . Frequency-urgency syndrome   .  Gait disorder    ORGANIC PER NERUOLGIST NOTE (DR HICKLING)  . 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 GERD (gastroesophageal reflux disease)   . Gout   . H/O sinus tachycardia   . History of acute respiratory failure 02/14/2015   SECONDARY TO CAP  . History of recurrent UTIs   . Interstitial cystitis   . Moderate intellectual disability   . OSA (obstructive sleep apnea)    no machine yet   . Partial epilepsy with impairment of consciousness (HCC)    FOLLOWED BY DR HICKLING  . Pneumonia 2    4 times this year ( 2020)  . PONV (postoperative nausea and vomiting)   . Seizure (HCC)    most recent sz " at the end of summer"-last sz years ago per mother  . Urinary incontinence     Past medical history comments: See HPI Copied from previous record: EEG on September 14, 2011, was normal. She was able to be successfully weaned off Keppra. The patient has significant issues with aggression, which was thought to be related to Keppra, but has persisted once it was discontinued. The patient was treated with Depakote both for behavior and possible seizures. She had palpitations and severe constipation on Seroquel. She had marked weight gain on Abilify and Depakote. When generic Divalproex was tried she had breakthrough seizures.   Nocturnal polysomnogram on October 27, 2011, failed to show significant periodic limb movements, cyanosis, sleep apnea, or cardiac arrhythmia. EEG on February 20, 2012, was a normal study with the patient awake.   EEG performed March 25, 2017 was normal  EEG performed May 21, 2018 was normal  Surgical history: Past Surgical History:  Procedure Laterality Date  . CYSTOSCOPY N/A 06/03/2017   Procedure: CYSTOSCOPY WITH EXAM UNDER ANESTHESIA;  Surgeon: Jerilee Field, MD;  Location: [facility];  Service: Urology;  Laterality: N/A;  . CYSTOSCOPY  2020  . CYSTOSCOPY WITH HYDRODISTENSION AND BIOPSY  04/14/2012   Procedure:  CYSTOSCOPY/BIOPSY/HYDRODISTENSION;  Surgeon: Lindaann Slough, MD;  Location: [facility] [place];  Service: Urology;;  instillation of marcaine and pyridium  . EUA/  PAP SMEAR/  BREAST EXAM  03-12-2016   dr Erin Fulling [facility]  . EUA/ VAGINOSCOPY/ COLPOSCOPY FO THE HYMENAL RING  10-04-2009    dr Tamela Oddi  [facility]  . TONSILLECTOMY       Family history: family history includes Pancreatic cancer in her sister; Seizures in her mother and sister.   Social history: Social History   Socioeconomic History  . Marital status: Single    Spouse name: Not on file  . Number of children: Not on file  . Years of education: Not on file  . Highest education level: Not on file  Occupational History  . Not on file  Tobacco Use  . Smoking status: Never Smoker  . Smokeless tobacco: Never Used  Substance and Sexual Activity  . Alcohol use: No    Alcohol/week: 0.0 standard drinks  . Drug use: No  . Sexual activity: Never    Birth control/protection: Pill  Other Topics Concern  . Not on file  Social History Narrative   Lives with mom ([name] Natal) and half-sister Lynnae Ludemann) who is also mentally impaired.  Has CAP assistance, urinary incontinence, needs help with feeding,dressing, toileting   Graduated from [employer].  She enjoys playing with cars, dancing, and dogs.   Social Determinants of Health   Financial Resource Strain:   . Difficulty of Paying Living Expenses:   Food Insecurity:   . Worried About Programme researcher, broadcasting/film/video in the Last Year:   . Barista in the Last Year:   Transportation Needs:   . Freight forwarder (Medical):   Marland Kitchen Lack of Transportation (Non-Medical):   Physical Activity:   . Days of Exercise per Week:   . Minutes of Exercise per Session:   Stress:   . Feeling of Stress :   Social Connections:   . Frequency of Communication with Friends and Family:   . Frequency of Social Gatherings with Friends and Family:   . Attends Religious  Services:   . Active Member of Clubs or Organizations:   . Attends Banker Meetings:   Marland Kitchen Marital Status:   Intimate Partner Violence:   . Fear of Current or Ex-Partner:   . Emotionally Abused:   Marland Kitchen Physically Abused:   . Sexually Abused:     Past/failed meds: Levetiracetam - aggressive behavior  Allergies: Allergies  Allergen Reactions  . Abilify [Aripiprazole] Swelling and Palpitations  . Seroquel [Quetiapine Fumarate] Palpitations  . Amoxicillin-Pot Clavulanate Hives and Other (See Comments)    Has patient had a PCN reaction causing immediate rash, facial/tongue/throat swelling, SOB or lightheadedness with hypotension: No Has patient had a PCN reaction causing severe rash involving mucus membranes or skin necrosis:    #  #  YES  #  #  Has patient had a PCN reaction that required hospitalization: No Has patient had a PCN reaction occurring within the last 10 years: No   . Bactrim [Sulfamethoxazole-Trimethoprim] Other (See Comments)    Abdominal pain  . Doxycycline Other (See Comments)    Stomach cramps  . Macrobid [Nitrofurantoin Macrocrystal] Other (See Comments)    Sharp pain/ given with Tylenol 325 mg  . Keppra [Levetiracetam] Other (See Comments)    Aggressive behavior     Immunizations: Immunization History  Administered Date(s) Administered  . Pneumococcal Polysaccharide-23 08/06/2016  . Tdap 03/03/2017    Diagnostics/Screenings: 03/27/17 - CT head - normal   Physical Exam: BP 108/62   Pulse 78   Ht 4' 9.5" (1.461 m)   Wt 190 lb 12.8 oz (86.5 kg)   BMI 40.57 kg/m   General: well developed, well nourished obese woman, seated on exam table, in no evident distress; black hair, brown eyes, right handed Head: normocephalic and atraumatic. Oropharynx benign. No dysmorphic features. Neck: supple with no carotid bruits. Cardiovascular: regular rate and rhythm, no murmurs. Respiratory: Clear to auscultation bilaterally Abdomen: Bowel sounds present all  four quadrants, abdomen soft, non-tender, non-distended.  Musculoskeletal: No skeletal deformities or obvious scoliosis.  Skin: no rashes or neurocutaneous lesions  Neurologic Exam Mental Status: Awake and fully alert. Limited language. Variable eye contact. Smiles responsively. Tolerant of invasions in to her space Cranial Nerves: Fundoscopic exam - red reflex present.  Unable to fully visualize fundus.  Pupils equal briskly reactive to light.  Turns to localize faces, objects and sounds in the periphery. Facial movements are symmetric. Motor: Normal functional bulk, tone and strength. She had mild outstretched hand tremor today. Sensory: Withdrawal x 4 Coordination: Unable to adequately assess due to patient's inability to participate in examination. No dysmetria when reaching for objects.  Gait and Station: Able to independently stand and bear weight. She turns the left toe in while walking. Balance is fair, does best with person assistance. Reflexes: Diminished and symmetric. Toes neutral. No clonus  Impression: 1. Partial epilepsy with impairment of consciousness 2. General convulsive epilepsy 3. Intellectual disability 4. Insomnia and sleep arousals 5. Gait disorder 6. Intermittent tremors 7. Autism spectrum disorder 8. At risk for falls   Recommendations for plan of care: The patient's previous [facility] records were reviewed. Lakendra has neither had nor required imaging or lab studies since the last visit, other than what has been performed by her urologist, pulmonologist, PCP and the ER. Mom is aware of those results. She said that a Depakote level was drawn before a recent procedure and that she was told that it was therapeutic. Karly is a 24 year old young woman with history of epilepsy, intellectual disability, insomnia and sleep arousals, gait disorder and autism. Mom reports increased seizure activity over the last 2 months. I recommended repeating an EEG and asked Mom to ask the  urology office to fax lab results to me. If the EEG is not revealing, we may consider a 48 hour ambulatory EEG. I instructed Mom to continue her medications without change for now. I told Mom that I will call when I receive the EEG report. We also talked about working to help Ragan to lose some weight, such as portion control, limiting snacks and helping her to be more active.  Mom agreed with this plan.   The medication list was reviewed and reconciled. No changes were made in the prescribed medications today. A complete medication list was provided to the patient.  Allergies as of 06/11/2019      Reactions   Abilify [aripiprazole] Swelling, Palpitations   Seroquel [quetiapine Fumarate] Palpitations   Amoxicillin-pot Clavulanate Hives, Other (See Comments)   Has patient had a PCN reaction causing immediate rash, facial/tongue/throat swelling, SOB or lightheadedness with hypotension: No Has patient had a PCN reaction causing severe rash involving mucus membranes or skin necrosis:    #  #  YES  #  #  Has patient had a PCN reaction that required hospitalization: No Has patient had a PCN reaction occurring within the last 10 years: No   Bactrim [sulfamethoxazole-trimethoprim] Other (See Comments)   Abdominal pain   Doxycycline Other (See Comments)   Stomach cramps   Macrobid [nitrofurantoin Macrocrystal] Other (See Comments)   Sharp pain/ given with Tylenol 325 mg   Keppra [levetiracetam] Other (See Comments)   Aggressive behavior       Medication List       Accurate as of June 11, 2019  8:23 AM. If you have any questions, ask your nurse or doctor.        acetaminophen 325 MG tablet Commonly known as: TYLENOL Take 2 tablets (650 mg total) by mouth every 6 (six) hours as needed for mild pain or moderate pain.   albuterol (2.5 MG/3ML) 0.083% nebulizer solution Commonly known as: PROVENTIL INHALE CONTENTS OF 1 VIAL IN NEBULIZER EVERY 4 TO 6 HOURS IF NEEDED FOR COUGH OR WHEEZING What  changed:   how much to take  how to take this  when to take this  reasons to take this  additional instructions   amoxicillin 500 MG tablet Commonly known as: AMOXIL Take 500 mg by mouth 3 (three) times daily.   benzonatate 200 MG capsule Commonly known as: TESSALON TAKE 1 CAPSULE(200 MG) BY MOUTH THREE TIMES DAILY  AS NEEDED   budesonide 1 MG/2ML nebulizer solution Commonly known as: PULMICORT Inhale 1 vial 4 times per day, during upper respiratory infections and asthma flares for 1-2 weeks What changed:   how much to take  how to take this  when to take this   cephALEXin 250 MG capsule Commonly known as: KEFLEX Take 250 mg by mouth daily.   cetirizine 10 MG tablet Commonly known as: ZYRTEC TAKE 1 TABLET(10 MG) BY MOUTH DAILY   clotrimazole-betamethasone cream Commonly known as: LOTRISONE Apply to affected area 2 times daily prn   Depakote 500 MG DR tablet Generic drug: divalproex TAKE 1 TABLET BY MOUTH IN THE MORNING AND 2 AT NIGHT What changed:   how much to take  how to take this  when to take this  additional instructions   famotidine 20 MG tablet Commonly known as: PEPCID Take 1 tablet (20 mg total) by mouth every 12 (twelve) hours.   fluconazole 150 MG tablet Commonly known as: DIFLUCAN Take 150 mg by mouth as needed (when on antibiotics).   fosfomycin 3 g Pack Commonly known as: MONUROL Take 3 grams by mouth  every 3 DAYS for 4 doses   hydrOXYzine 25 MG tablet Commonly known as: ATARAX/VISTARIL Take 25 mg by mouth 3 (three) times daily as needed (pain).   Levonorgestrel-Ethinyl Estradiol 0.15-0.03 &0.01 MG tablet Commonly known as: Seasonique Take 1 tablet by mouth daily.   melatonin 3 MG Tabs tablet Take 3 mg by mouth at bedtime.   mometasone 50 MCG/ACT nasal spray Commonly known as: NASONEX Use one spray in each nostril once daily as directed.   montelukast 10 MG tablet Commonly known as: SINGULAIR TAKE 1 TABLET(10 MG) BY  MOUTH AT BEDTIME   Nebulizer Misc Adult mask with tubing. Use as directed with nebulizer device.   ondansetron 4 MG disintegrating tablet Commonly known as: Zofran ODT Take 1 tablet (4 mg total) by mouth every 8 (eight) hours as needed for nausea. What changed: how much to take   Perforomist 20 MCG/2ML nebulizer solution Generic drug: formoterol USE 2 MLS BY NEBULIZATION TWICE DAILY   polyethylene glycol powder 17 GM/SCOOP powder Commonly known as: GLYCOLAX/MIRALAX Dissolve 17 grams (1 capful) in at least 8 ounces of water/juice and drink twice daily   predniSONE 10 MG tablet Commonly known as: DELTASONE Take three tablets twice daily for 7 days.   promethazine 25 MG suppository Commonly known as: PHENERGAN Place 1 suppository (25 mg total) rectally every 6 (six) hours as needed for nausea or vomiting.   promethazine 25 MG tablet Commonly known as: PHENERGAN Take 1 tablet (25 mg total) by mouth every 6 (six) hours as needed for nausea or vomiting.   senna 8.6 MG tablet Commonly known as: SENOKOT Take 1 tablet by mouth at bedtime.   terconazole 0.4 % vaginal cream Commonly known as: TERAZOL 7 Place 1 applicator vaginally at bedtime. What changed:   when to take this  reasons to take this   traZODone 50 MG tablet Commonly known as: DESYREL Take 2 tablets (100 mg total) by mouth at bedtime.   Yupelri 175 MCG/3ML nebulizer solution Generic drug: revefenacin USE 1 VIAL VIA NEBULIZER DAILY What changed: See the new instructions.      Total time spent with the patient was 30 minutes, of which 50% or more was spent in counseling and coordination of care.  Elveria Risingina Meighan Treto NP-C [facility] [phone] Fax [phone]

## 2019-06-11 NOTE — Patient Instructions (Signed)
Thank you for coming in today.   Instructions for you until your next appointment are as follows: 1. I have scheduled an EEG for you on Monday June 14, 2019 at 8AM at [facility].  I will call you when I receive the report.  2. Continue taking the Depakote as prescribed for now.  3. Try to work on [employer] lose some weight 4. Please plan to return for follow up in 6 months or sooner if needed.

## 2019-06-14 ENCOUNTER — Other Ambulatory Visit: Payer: Self-pay

## 2019-06-14 ENCOUNTER — Ambulatory Visit (HOSPITAL_COMMUNITY)
Admission: RE | Admit: 2019-06-14 | Discharge: 2019-06-14 | Disposition: A | Discharge status: Home / Self Care | Payer: Medicare Other | Source: Ambulatory Visit | Attending: Family | Admitting: Family

## 2019-06-14 DIAGNOSIS — F79 Unspecified intellectual disabilities: Secondary | ICD-10-CM | POA: Diagnosis not present

## 2019-06-14 DIAGNOSIS — G40209 Localization-related (focal) (partial) symptomatic epilepsy and epileptic syndromes with complex partial seizures, not intractable, without status epilepticus: Secondary | ICD-10-CM

## 2019-06-14 DIAGNOSIS — F840 Autistic disorder: Secondary | ICD-10-CM | POA: Insufficient documentation

## 2019-06-14 DIAGNOSIS — G40909 Epilepsy, unspecified, not intractable, without status epilepticus: Secondary | ICD-10-CM | POA: Diagnosis not present

## 2019-06-14 DIAGNOSIS — R569 Unspecified convulsions: Secondary | ICD-10-CM | POA: Diagnosis not present

## 2019-06-14 DIAGNOSIS — G40309 Generalized idiopathic epilepsy and epileptic syndromes, not intractable, without status epilepticus: Secondary | ICD-10-CM

## 2019-06-14 NOTE — Progress Notes (Signed)
EEG complete - results pending 

## 2019-06-15 NOTE — Procedures (Signed)
Patient:  Lori Miles   Sex: female  DOB:  06/22/1995  Date of study: 06/14/2019  Clinical history: This is a 24 year old female with history of autism and intellectual disability and history of epilepsy with a concern of seizure activity every day which is described as nodding her head, eyes rolling back and twisting her tongue.  She was not responsive during that time and the episodes may happen back-to-back last for a few minutes and up to 20 minutes total.  EEG was done to evaluate for possible epileptic event.  Medication: Melatonin, famotidine  Procedure: The tracing was carried out on a 32 channel digital [employer]well recorder reformatted into 16 channel montages with 1 devoted to EKG.  The 10 /20 international system electrode placement was used. Recording was done during awake state. Recording time 30.5 minutes.   Description of findings: Background rhythm consists of amplitude of 30 microvolt and frequency of 9-10 hertz posterior dominant rhythm. There was normal anterior posterior gradient noted. Background was well organized, continuous and symmetric with no focal slowing. There were frequent muscle artifact as well as blinking artifacts noted.  Hyperventilation resulted in slowing of the background activity. Photic stimulation using stepwise increase in photic frequency resulted in bilateral symmetric driving response. Throughout the recording there were no focal or generalized epileptiform activities in the form of spikes or sharps noted. There were no transient rhythmic activities or electrographic seizures noted. One lead EKG rhythm strip revealed sinus rhythm at a rate of 75 bpm.  Impression: This EEG is normal during awake state. Please note that normal EEG does not exclude epilepsy, clinical correlation is indicated.  If there is any clinical concern for seizure activity, a prolonged ambulatory EEG is recommended.    Keturah Shavers, MD

## 2019-06-16 ENCOUNTER — Telehealth (INDEPENDENT_AMBULATORY_CARE_PROVIDER_SITE_OTHER): Payer: Self-pay | Admitting: Family

## 2019-06-16 NOTE — Telephone Encounter (Signed)
I called Mom and let her know that the EEG done this week was normal. Mom is interested in [place] having a 48 hour study as she says that [place] continues to have frequent seizures. It is not clear to me that these events are seizures, so I asked Mom to video some events if she can, and that I will order the 48 hour study. Mom said that the family was moving soon and that she will call after they move to schedule the ambulatory EEG. TG

## 2019-06-22 ENCOUNTER — Ambulatory Visit: Payer: Medicare Other | Attending: Family | Admitting: Physical Therapy

## 2019-06-22 ENCOUNTER — Other Ambulatory Visit: Payer: Self-pay

## 2019-06-22 ENCOUNTER — Encounter: Payer: Self-pay | Admitting: Physical Therapy

## 2019-06-22 DIAGNOSIS — R2681 Unsteadiness on feet: Secondary | ICD-10-CM | POA: Diagnosis present

## 2019-06-22 DIAGNOSIS — M6281 Muscle weakness (generalized): Secondary | ICD-10-CM | POA: Diagnosis present

## 2019-06-22 DIAGNOSIS — R2689 Other abnormalities of gait and mobility: Secondary | ICD-10-CM | POA: Diagnosis not present

## 2019-06-23 NOTE — Therapy (Signed)
[facility] [address] [place], [place], [address] Phone: [phone]   Fax:  [phone]  Physical Therapy Treatment  Patient Details  Name: Lori Miles MRN: [id number] Date of Birth: [birth year] Referring Provider (PT): Rockwell Germany, NP   Encounter Date: 06/22/2019  PT End of Session - 06/23/19 1904    Visit Number  3    Number of Visits  7   eval + 6 visits   Date for PT Re-Evaluation  07/16/19    Authorization Type  UHC Medicare    Authorization Time Period  06-01-19 - 08-30-19    PT Start Time  0716    PT Stop Time  0802    PT Time Calculation (min)  46 min    Activity Tolerance  Patient tolerated treatment well    Behavior During Therapy  [facility] for tasks assessed/performed       Past Medical History:  Diagnosis Date  . Asthma   . Autism    mom states no actual dx, but admits she has some symptoms  . Bacteriuria   . Chronic constipation   . Cognitive developmental delay   . Constipation   . Disruptive behavior disorder   . Dyspnea    if she walks much  . Family history of adverse reaction to anesthesia     "hives"-? if anesthesia did take antibiotic  . Frequency-urgency syndrome   . Gait disorder    ORGANIC PER NERUOLGIST NOTE (DR HICKLING)  . Generalized convulsive epilepsy ([place]) NEUROLOGIST-  DR HICKLING  . GERD (gastroesophageal reflux disease)   . Gout   . H/O sinus tachycardia   . History of acute respiratory failure 02/14/2015   SECONDARY TO CAP  . History of recurrent UTIs   . Interstitial cystitis   . Moderate intellectual disability   . OSA (obstructive sleep apnea)    no machine yet   . Partial epilepsy with impairment of consciousness ([place])    FOLLOWED BY DR HICKLING  . Pneumonia 2    4 times this year ( 2020)  . PONV (postoperative nausea and vomiting)   . Seizure ([place])    most recent sz " at the end of summer"-last sz years ago per mother  . Urinary incontinence      Past Surgical History:  Procedure Laterality Date  . CYSTOSCOPY N/A 06/03/2017   Procedure: CYSTOSCOPY WITH EXAM UNDER ANESTHESIA;  Surgeon: Festus Aloe, MD;  Location[facility];  Service: Urology;  Laterality: N/A;  . CYSTOSCOPY  2020  . CYSTOSCOPY WITH HYDRODISTENSION AND BIOPSY  04/14/2012   Procedure: CYSTOSCOPY/BIOPSY/HYDRODISTENSION;  Surgeon: Hanley Ben, MD;  Location: [facility];  Service: Urology;;  instillation of marcaine and pyridium  . EUA/  PAP SMEAR/  BREAST EXAM  03-12-2016   dr Ihor Dow [facility]  . EUA/ VAGINOSCOPY/ COLPOSCOPY FO THE HYMENAL RING  10-04-2009    dr Delsa Sale  [facility]  . TONSILLECTOMY      There were no vitals filed for this visit.                    [facility] Adult PT Treatment/Exercise - 06/23/19 0001      Transfers   Transfers  Sit to Stand    Number of Reps  Other reps (comment)   5   Comments  no UE support used - feet on blue Airex for incr. challenge with balance      Ambulation/Gait   Ambulation/Gait  Yes    Ambulation/Gait Assistance  5: Supervision    Ambulation Distance (Feet)  350 Feet   115' (1 lap) later in session   Assistive device  None    Gait Pattern  Step-through pattern    Ambulation Surface  Level;Indoor      Knee/Hip Exercises: Aerobic   Recumbent Bike  SciFit level 2.5 x 5" with intermittent rest breaks - w/UE's & LE's      Knee/Hip Exercises: Machines for Strengthening   Cybex Leg Press  bil. LE's 40# 2 sets 10 reps       Knee/Hip Exercises: Standing   Hip Abduction  Stengthening;Right;Left;1 set   2# weight on each leg   Hip Extension  Stengthening;Right;Left;1 set   2# weight used on each leg         Balance Exercises - 06/23/19 1902      Balance Exercises: Standing   Other Standing Exercises  pt performed cone taps to 4 cones 2 reps each foot to fascilitate improved SLS       [name] given info on [employer] store for consult with  purchasing new, appropriate tennis shoes for improved  ambulation and foot support during gait       PT Long Term Goals - 06/23/19 1907      PT LONG TERM GOAL #1   Title  Pt. will amb. 500' on flat, even surface without LOB.      Time  6    Period  Weeks    Status  New      PT LONG TERM GOAL #2   Title  Pt will perform at least 10" on recumbent bike nonstop for improved endurance/activity tolerance.    Time  6    Period  Weeks    Status  New      PT LONG TERM GOAL #3   Title  Mother will verbalize understanding of HEP to assist with LE strengthening.      Time  6    Period  Weeks    Status  New      PT LONG TERM GOAL #4   Title  Perform sit to stand 5 reps without UE support from mat to demo increased bil. LE strength.    Time  6    Period  Weeks    Status  New            Plan - 06/23/19 1905    Clinical Impression Statement  Pt tolerated all exercises well today with infrequent short seated rest breaks taken.  Pt needed tactile cues for correct performance of hip strengthening exercises with use of 2# weight, as pt performed hip extension with each knee flexed.  No occurrences of knees buckling in today's session.    Personal Factors and Comorbidities  Behavior Pattern;Past/Current Experience;Fitness;Comorbidity 2;Time since onset of injury/illness/exacerbation    Examination-Activity Limitations  Stand;Locomotion Level;Bend;Squat;Stairs    Examination-Participation Restrictions  Community Activity;Interpersonal Relationship;Shop    Stability/Clinical Decision Making  Stable/Uncomplicated    Rehab Potential  Good    PT Frequency  1x / week    PT Duration  6 weeks    PT Treatment/Interventions  ADLs/Self Care Home Management;Balance training;Gait training;Neuromuscular re-education;Stair training;Patient/family education;Therapeutic activities;Therapeutic exercise    PT Next Visit Plan  gait train, balance exercises, SciFit    Consulted and Agree with Plan of Care   Patient;Family member/caregiver    Family Member Consulted  mother - [name]  Patient will benefit from skilled therapeutic intervention in order to improve the following deficits and impairments:  Abnormal gait, Decreased balance, Decreased activity tolerance, Decreased strength, Pain, Decreased cognition  Visit Diagnosis: Other abnormalities of gait and mobility  Muscle weakness (generalized)  Unsteadiness on feet     Problem List Patient Active Problem List   Diagnosis Date Noted  . Complex care coordination 05/18/2019  . Screening for cervical cancer 02/26/2019  . Glucosuria 06/01/2018  . 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 Not well controlled moderate persistent asthma 04/29/2018  . Gastroesophageal reflux disease 04/29/2018  . Recurrent infections 04/29/2018  . Dysuria 02/20/2018  . Chronic rhinitis 04/03/2017  . Recurrent falls 03/21/2017  . Weakness 03/21/2017  . Venous insufficiency 03/21/2017  . OSA on CPAP 09/30/2016  . Excessive daytime sleepiness 03/05/2016  . Bronchitis 05/05/2015  . 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 Cough variant asthma vs UACS/ vcd  03/15/2015  . Autism spectrum disorder 02/14/2015  . Nausea with vomiting 02/14/2015  . Obesity, morbid (HCC) 12/13/2014  . 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 Acanthosis nigricans 11/14/2014  . Toeing-in 08/29/2014  . 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 Recurrent urinary tract infection 08/01/2011  . Asthma 08/01/2011  . Chronic constipation 08/01/2011  . Contraception 08/01/2011    Kary Kos, PT 06/23/2019, 7:09 PM  [facility] [address] [place], [place], [address] Phone:  [phone]   Fax:  [phone]  Name: Lori Miles MRN: [id number] Date of Birth: 11-15-1995

## 2019-06-24 ENCOUNTER — Other Ambulatory Visit: Payer: Self-pay | Admitting: Allergy and Immunology

## 2019-06-29 ENCOUNTER — Ambulatory Visit: Payer: Medicare Other | Admitting: Physical Therapy

## 2019-06-29 ENCOUNTER — Encounter: Payer: Self-pay | Admitting: Physical Therapy

## 2019-06-29 ENCOUNTER — Other Ambulatory Visit: Payer: Self-pay

## 2019-06-29 DIAGNOSIS — R2689 Other abnormalities of gait and mobility: Secondary | ICD-10-CM | POA: Diagnosis not present

## 2019-06-29 DIAGNOSIS — M6281 Muscle weakness (generalized): Secondary | ICD-10-CM

## 2019-06-29 DIAGNOSIS — R2681 Unsteadiness on feet: Secondary | ICD-10-CM

## 2019-06-30 ENCOUNTER — Encounter: Payer: Self-pay | Admitting: Physical Therapy

## 2019-06-30 NOTE — Therapy (Signed)
[facility] [address] [place], [place], [address] Phone: [phone]   Fax:  [phone]  Physical Therapy Treatment  Patient Details  Name: Lori Miles MRN: [id number] Date of Birth: 02-26-1996 Referring Provider (PT): Elveria Rising, NP   Encounter Date: 06/29/2019  PT End of Session - 06/30/19 1507    Visit Number  4    Number of Visits  7   eval + 6 visits   Date for PT Re-Evaluation  07/16/19    Authorization Type  UHC Medicare    Authorization Time Period  06-01-19 - 08-30-19    PT Start Time  0733    PT Stop Time  0822    PT Time Calculation (min)  49 min    Activity Tolerance  Patient tolerated treatment well    Behavior During Therapy  [facility] for tasks assessed/performed       Past Medical History:  Diagnosis Date  . Asthma   . Autism    mom states no actual dx, but admits she has some symptoms  . Bacteriuria   . Chronic constipation   . Cognitive developmental delay   . Constipation   . Disruptive behavior disorder   . Dyspnea    if she walks much  . Family history of adverse reaction to anesthesia     "hives"-? if anesthesia did take antibiotic  . Frequency-urgency syndrome   . Gait disorder    ORGANIC PER NERUOLGIST NOTE (DR HICKLING)  . 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 GERD (gastroesophageal reflux disease)   . Gout   . H/O sinus tachycardia   . History of acute respiratory failure 02/14/2015   SECONDARY TO CAP  . History of recurrent UTIs   . Interstitial cystitis   . Moderate intellectual disability   . OSA (obstructive sleep apnea)    no machine yet   . Partial epilepsy with impairment of consciousness (HCC)    FOLLOWED BY DR HICKLING  . Pneumonia 2    4 times this year ( 2020)  . PONV (postoperative nausea and vomiting)   . Seizure (H[facility])    most recent sz " at the end of summer"-last sz years ago per mother  . Urinary incontinence      Past Surgical History:  Procedure Laterality Date  . CYSTOSCOPY N/A 06/03/2017   Procedure: CYSTOSCOPY WITH EXAM UNDER ANESTHESIA;  Surgeon: Jerilee Field, MD;  Location: [facility];  Service: Urology;  Laterality: N/A;  . CYSTOSCOPY  2020  . CYSTOSCOPY WITH HYDRODISTENSION AND BIOPSY  04/14/2012   Procedure: CYSTOSCOPY/BIOPSY/HYDRODISTENSION;  Surgeon: Lindaann Slough, MD;  Location: [facility];  Service: Urology;;  instillation of marcaine and pyridium  . EUA/  PAP SMEAR/  BREAST EXAM  03-12-2016   dr Erin Fulling [facility]  . EUA/ VAGINOSCOPY/ COLPOSCOPY FO THE HYMENAL RING  10-04-2009    dr Tamela Oddi  [facility]  . TONSILLECTOMY      There were no vitals filed for this visit.  Subjective Assessment - 06/29/19 0736    Subjective  [name] states pt has had a good week - says they changed the antibiotic from Keflex to a different one and she is doing much better    Pertinent History  Autism;  Developmental Delay; Generalized convulsive epilepsy; seizures; Recurrent falls    Patient Stated Goals  improve strength; improve balance; try to improve breathing function (I informed pt's mother that we do not specifically address  this problem at [facility] but cardiovascular exercise would be included with exercises)    Currently in Pain?  No/denies    Pain Onset  More than a month ago                       [facility] Adult PT Treatment/Exercise - 06/30/19 0001      Ambulation/Gait   Ambulation/Gait  Yes    Ambulation/Gait Assistance  5: Supervision    Ambulation Distance (Feet)  345 Feet   3 laps, 2 laps: 3 laps at end of session = 920' total     Neuro Re-ed    Neuro Re-ed Details   pt performed standing balance activity - obstacle course formed with use of cones for cone taps to fascilitate SLS on each leg, ladder to fascilitate increased step length and SLS, and "river stones" to fasciliitate increased step length, SLS and trunk control with static  standing on the river stone      Exercises   Exercises  Knee/Hip      Knee/Hip Exercises: Aerobic   Recumbent Bike  SciFit level 2.5 x 5" with intermittent rest breaks - w/UE's & LE's      Knee/Hip Exercises: Machines for Strengthening   Cybex Leg Press  50# bil. LE's 3 sets 10 reps      Knee/Hip Exercises: Standing   Forward Step Up  Both;Hand Hold: 1;Step Height: 6";1 set   3 reps on each leg          Balance Exercises - 06/30/19 1504      Balance Exercises: Standing   Rockerboard  Anterior/posterior;EO;Intermittent UE support;Other reps (comment)   2 sets 10 reps   Other Standing Exercises  standing on Bosu inside // bars with bil. UE support - weight shifts laterally 10 reps, squats x 10 reps with bil. UE support      Neuro Re-ed:  Pt stood on blue Airex - reached behind on Rt and Lt sides for bean bags placed on mat table - tossed in Basket for improved static standing balance on compliant surface;  Pt able to partially squat for more precision with tossing The bean bag into the basket (pt initiated this movement of partial squat independently without cues to do so)       PT Long Term Goals - 06/30/19 1510      PT LONG TERM GOAL #1   Title  Pt. will amb. 500' on flat, even surface without LOB.      Time  6    Period  Weeks    Status  New      PT LONG TERM GOAL #2   Title  Pt will perform at least 10" on recumbent bike nonstop for improved endurance/activity tolerance.    Time  6    Period  Weeks    Status  New      PT LONG TERM GOAL #3   Title  Mother will verbalize understanding of HEP to assist with LE strengthening.      Time  6    Period  Weeks    Status  New      PT LONG TERM GOAL #4   Title  Perform sit to stand 5 reps without UE support from mat to demo increased bil. LE strength.    Time  6    Period  Weeks    Status  New            Plan -  06/30/19 1507    Clinical Impression Statement  Pt had increased participation and compliance  with all activities in today's PT session.  Pt did express discomfort/some resistance in performing step up exercise on each leg for quad strengthening so this exercise was discontinued after pt performed 3 reps with each leg.  No tremors or unsteadiness noted in today's session.    Personal Factors and Comorbidities  Behavior Pattern;Past/Current Experience;Fitness;Comorbidity 2;Time since onset of injury/illness/exacerbation    Examination-Activity Limitations  Stand;Locomotion Level;Bend;Squat;Stairs    Examination-Participation Restrictions  Community Activity;Interpersonal Relationship;Shop    Stability/Clinical Decision Making  Stable/Uncomplicated    Rehab Potential  Good    PT Frequency  1x / week    PT Duration  6 weeks    PT Treatment/Interventions  ADLs/Self Care Home Management;Balance training;Gait training;Neuromuscular re-education;Stair training;Patient/family education;Therapeutic activities;Therapeutic exercise    PT Next Visit Plan  gait train, balance exercises, SciFit    Consulted and Agree with Plan of Care  Patient;Family member/caregiver    Family Member Consulted  mother [name]       Patient will benefit from skilled therapeutic intervention in order to improve the following deficits and impairments:  Abnormal gait, Decreased balance, Decreased activity tolerance, Decreased strength, Pain, Decreased cognition  Visit Diagnosis: Other abnormalities of gait and mobility  Muscle weakness (generalized)  Unsteadiness on feet     Problem List Patient Active Problem List   Diagnosis Date Noted  . Complex care coordination 05/18/2019  . Screening for cervical cancer 02/26/2019  . Glucosuria 06/01/2018  . Steroid-induced diabetes ([place]) 06/01/2018  . Not well controlled moderate persistent asthma 04/29/2018  . Gastroesophageal reflux disease 04/29/2018  . Recurrent infections 04/29/2018  . Dysuria 02/20/2018  . Chronic rhinitis 04/03/2017  . Recurrent falls  03/21/2017  . Weakness 03/21/2017  . Venous insufficiency 03/21/2017  . OSA on CPAP 09/30/2016  . Excessive daytime sleepiness 03/05/2016  . Bronchitis 05/05/2015  . 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 Cough variant asthma vs UACS/ vcd  03/15/2015  . Autism spectrum disorder 02/14/2015  . Nausea with vomiting 02/14/2015  . Obesity, morbid ([place]) 12/13/2014  . 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 Acanthosis nigricans 11/14/2014  . Toeing-in 08/29/2014  . Generalized convulsive epilepsy ([place]) 09/28/2012  . Partial epilepsy with impairment of consciousness ([place]) 09/28/2012  . 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 Recurrent urinary tract infection 08/01/2011  . Asthma 08/01/2011  . Chronic constipation 08/01/2011  . Contraception 08/01/2011    Alda Lea, PT 06/30/2019, 3:13 PM  [facility] [address] [place], [place], [address] Phone: [phone]   Fax:  [phone]  Name: Lori Miles MRN: [id number] Date of Birth: 1995/04/12

## 2019-07-06 ENCOUNTER — Other Ambulatory Visit: Payer: Self-pay

## 2019-07-06 ENCOUNTER — Ambulatory Visit: Payer: Medicare Other | Admitting: Physical Therapy

## 2019-07-06 DIAGNOSIS — R2689 Other abnormalities of gait and mobility: Secondary | ICD-10-CM

## 2019-07-06 DIAGNOSIS — M6281 Muscle weakness (generalized): Secondary | ICD-10-CM

## 2019-07-06 DIAGNOSIS — R2681 Unsteadiness on feet: Secondary | ICD-10-CM

## 2019-07-07 ENCOUNTER — Encounter: Payer: Self-pay | Admitting: Physical Therapy

## 2019-07-07 NOTE — Therapy (Signed)
[facility] [address] [place], [place], [address] Phone: [phone]   Fax:  [phone]  Physical Therapy Treatment  Patient Details  Name: Lori Miles MRN: [id number] Date of Birth: Feb 12, 1996 Referring Provider (PT): Rockwell Germany, NP   Encounter Date: 07/06/2019  PT End of Session - 07/07/19 2024    Visit Number  5    Number of Visits  7    Date for PT Re-Evaluation  07/16/19    Authorization Type  UHC Medicare    Authorization Time Period  06-01-19 - 08-30-19    PT Start Time  0716    PT Stop Time  0802    PT Time Calculation (min)  46 min    Activity Tolerance  Patient tolerated treatment well    Behavior During Therapy  [facility] for tasks assessed/performed       Past Medical History:  Diagnosis Date  . Asthma   . Autism    mom states no actual dx, but admits she has some symptoms  . Bacteriuria   . Chronic constipation   . Cognitive developmental delay   . Constipation   . Disruptive behavior disorder   . Dyspnea    if she walks much  . Family history of adverse reaction to anesthesia     "hives"-? if anesthesia did take antibiotic  . Frequency-urgency syndrome   . Gait disorder    ORGANIC PER NERUOLGIST NOTE (DR HICKLING)  . Generalized convulsive epilepsy ([place]) NEUROLOGIST-  DR HICKLING  . GERD (gastroesophageal reflux disease)   . Gout   . H/O sinus tachycardia   . History of acute respiratory failure 02/14/2015   SECONDARY TO CAP  . History of recurrent UTIs   . Interstitial cystitis   . Moderate intellectual disability   . OSA (obstructive sleep apnea)    no machine yet   . Partial epilepsy with impairment of consciousness ([place])    FOLLOWED BY DR HICKLING  . Pneumonia 2    4 times this year ( 2020)  . PONV (postoperative nausea and vomiting)   . Seizure ([place])    most recent sz " at the end of summer"-last sz years ago per mother  . Urinary incontinence     Past Surgical  History:  Procedure Laterality Date  . CYSTOSCOPY N/A 06/03/2017   Procedure: CYSTOSCOPY WITH EXAM UNDER ANESTHESIA;  Surgeon: Festus Aloe, MD;  Location: [facility];  Service: Urology;  Laterality: N/A;  . CYSTOSCOPY  2020  . CYSTOSCOPY WITH HYDRODISTENSION AND BIOPSY  04/14/2012   Procedure: CYSTOSCOPY/BIOPSY/HYDRODISTENSION;  Surgeon: Hanley Ben, MD;  Location: [facility];  Service: Urology;;  instillation of marcaine and pyridium  . EUA/  PAP SMEAR/  BREAST EXAM  03-12-2016   dr Ihor Dow [facility]  . EUA/ VAGINOSCOPY/ COLPOSCOPY FO THE HYMENAL RING  10-04-2009    dr Delsa Sale  [facility]  . TONSILLECTOMY      There were no vitals filed for this visit.                    [facility] Adult PT Treatment/Exercise - 07/07/19 0001      Ambulation/Gait   Ambulation/Gait  Yes    Ambulation/Gait Assistance  5: Supervision    Ambulation Distance (Feet)  350 Feet   230, 350, 115   Assistive device  None    Gait Pattern  Within Functional Limits    Ambulation Surface  Level;Indoor  Knee/Hip Exercises: Aerobic   Recumbent Bike  SciFit level 2.5 x 5" with intermittent rest breaks - w/UE's & LE's      Knee/Hip Exercises: Machines for Strengthening   Cybex Leg Press  50# bil. LE's 3 sets 10 reps          Balance Exercises - 07/07/19 2023      Balance Exercises: Standing   Rockerboard  Anterior/posterior;EO;Intermittent UE support;Other reps (comment)   2 sets 10 reps   Heel Raises  Both;10 reps    Sit to Stand  Standard surface   feet on Airex - 5 reps no UE support      Obstacle course - orange hurdles for stepping over and river stones for improved SLS and to fascilitate incr. step length       PT Long Term Goals - 07/07/19 2033      PT LONG TERM GOAL #1   Title  Pt. will amb. 500' on flat, even surface without LOB.      Time  6    Period  Weeks    Status  New      PT LONG TERM GOAL #2   Title  Pt will perform at  least 10" on recumbent bike nonstop for improved endurance/activity tolerance.    Time  6    Period  Weeks    Status  New      PT LONG TERM GOAL #3   Title  Mother will verbalize understanding of HEP to assist with LE strengthening.      Time  6    Period  Weeks    Status  New      PT LONG TERM GOAL #4   Title  Perform sit to stand 5 reps without UE support from mat to demo increased bil. LE strength.    Time  6    Period  Weeks    Status  New            Plan - 07/07/19 2025    Clinical Impression Statement  Pt performed kicking activity with bean bags for approx. 15' to fascilitate SLS, then decided she did not want to participate in this activity and Lt knee appeared to buckle and pt went to sit down - appeared to be behavior-related.  Pt's activity tolerance is improving with pt ambulating  further distances in PT session with minimal c/o fatigue.    Personal Factors and Comorbidities  Behavior Pattern;Past/Current Experience;Fitness;Comorbidity 2;Time since onset of injury/illness/exacerbation    Examination-Activity Limitations  Stand;Locomotion Level;Bend;Squat;Stairs    Examination-Participation Restrictions  Community Activity;Interpersonal Relationship;Shop    Stability/Clinical Decision Making  Stable/Uncomplicated    Rehab Potential  Good    PT Frequency  1x / week    PT Duration  6 weeks    PT Treatment/Interventions  ADLs/Self Care Home Management;Balance training;Gait training;Neuromuscular re-education;Stair training;Patient/family education;Therapeutic activities;Therapeutic exercise    PT Next Visit Plan  gait train, balance exercises, SciFit    Consulted and Agree with Plan of Care  Patient;Family member/caregiver    Family Member Consulted  [name]       Patient will benefit from skilled therapeutic intervention in order to improve the following deficits and impairments:  Abnormal gait, Decreased balance, Decreased activity tolerance, Decreased  strength, Pain, Decreased cognition  Visit Diagnosis: Other abnormalities of gait and mobility  Muscle weakness (generalized)  Unsteadiness on feet     Problem List Patient Active Problem List   Diagnosis Date Noted  .  Complex care coordination 05/18/2019  . Screening for cervical cancer 02/26/2019  . Glucosuria 06/01/2018  . 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 Not well controlled moderate persistent asthma 04/29/2018  . Gastroesophageal reflux disease 04/29/2018  . Recurrent infections 04/29/2018  . Dysuria 02/20/2018  . Chronic rhinitis 04/03/2017  . Recurrent falls 03/21/2017  . Weakness 03/21/2017  . Venous insufficiency 03/21/2017  . OSA on CPAP 09/30/2016  . Excessive daytime sleepiness 03/05/2016  . Bronchitis 05/05/2015  . 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 Cough variant asthma vs UACS/ vcd  03/15/2015  . Autism spectrum disorder 02/14/2015  . Nausea with vomiting 02/14/2015  . Obesity, morbid (HCC) 12/13/2014  . 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 Acanthosis nigricans 11/14/2014  . Toeing-in 08/29/2014  . 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 Recurrent urinary tract infection 08/01/2011  . Asthma 08/01/2011  . Chronic constipation 08/01/2011  . Contraception 08/01/2011    Kary Kos, PT 07/07/2019, 8:35 PM  [facility] [address] [place], [place], [address] Phone: [phone]   Fax:  [phone]  Name: Lori Miles MRN: [id number] Date of Birth: 1995-10-31

## 2019-07-13 ENCOUNTER — Other Ambulatory Visit: Payer: Self-pay

## 2019-07-13 ENCOUNTER — Ambulatory Visit: Payer: Medicare Other | Admitting: Physical Therapy

## 2019-07-13 ENCOUNTER — Encounter: Payer: Self-pay | Admitting: Physical Therapy

## 2019-07-13 DIAGNOSIS — R2689 Other abnormalities of gait and mobility: Secondary | ICD-10-CM | POA: Diagnosis not present

## 2019-07-13 DIAGNOSIS — R2681 Unsteadiness on feet: Secondary | ICD-10-CM

## 2019-07-13 DIAGNOSIS — M6281 Muscle weakness (generalized): Secondary | ICD-10-CM

## 2019-07-13 NOTE — Therapy (Signed)
[facility] [address] [place], [place], [address] Phone: [phone]   Fax:  [phone]  Physical Therapy Treatment  Patient Details  Name: Lori Miles MRN: [id number] Date of Birth: 03/12/1996 Referring Provider (PT): Elveria Rising, NP   Encounter Date: 07/13/2019  PT End of Session - 07/13/19 2037    Visit Number  6    Number of Visits  7    Date for PT Re-Evaluation  07/16/19    Authorization Type  UHC Medicare    Authorization Time Period  06-01-19 - 08-30-19    PT Start Time  0803    PT Stop Time  0847    PT Time Calculation (min)  44 min    Activity Tolerance  Patient tolerated treatment well    Behavior During Therapy  [facility] for tasks assessed/performed       Past Medical History:  Diagnosis Date  . Asthma   . Autism    mom states no actual dx, but admits she has some symptoms  . Bacteriuria   . Chronic constipation   . Cognitive developmental delay   . Constipation   . Disruptive behavior disorder   . Dyspnea    if she walks much  . Family history of adverse reaction to anesthesia     "hives"-? if anesthesia did take antibiotic  . Frequency-urgency syndrome   . Gait disorder    ORGANIC PER NERUOLGIST NOTE (DR HICKLING)  . 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 GERD (gastroesophageal reflux disease)   . Gout   . H/O sinus tachycardia   . History of acute respiratory failure 02/14/2015   SECONDARY TO CAP  . History of recurrent UTIs   . Interstitial cystitis   . Moderate intellectual disability   . OSA (obstructive sleep apnea)    no machine yet   . Partial epilepsy with impairment of consciousness (HCC)    FOLLOWED BY DR HICKLING  . Pneumonia 2    4 times this year ( 2020)  . PONV (postoperative nausea and vomiting)   . Seizure (HCC)    most recent sz " at the end of summer"-last sz years ago per mother  . Urinary incontinence     Past Surgical  History:  Procedure Laterality Date  . CYSTOSCOPY N/A 06/03/2017   Procedure: CYSTOSCOPY WITH EXAM UNDER ANESTHESIA;  Surgeon: Jerilee Field, MD;  Location: [facility] [place];  Service: Urology;  Laterality: N/A;  . CYSTOSCOPY  2020  . CYSTOSCOPY WITH HYDRODISTENSION AND BIOPSY  04/14/2012   Procedure: CYSTOSCOPY/BIOPSY/HYDRODISTENSION;  Surgeon: Lindaann Slough, MD;  Location: [facility] [place];  Service: Urology;;  instillation of marcaine and pyridium  . EUA/  PAP SMEAR/  BREAST EXAM  03-12-2016   dr Erin Fulling [facility]  . EUA/ VAGINOSCOPY/ COLPOSCOPY FO THE HYMENAL RING  10-04-2009    dr Tamela Oddi  [facility]  . TONSILLECTOMY      There were no vitals filed for this visit.  Subjective Assessment - 07/13/19 0805    Subjective  [name] states pt has had another UTI - received 2 injections for it - prednisone & antibiotic - is doing better now    Pertinent History  Autism;  Developmental Delay; Generalized convulsive epilepsy; seizures; Recurrent falls    Patient Stated Goals  improve strength; improve balance; try to improve breathing function (I informed pt's mother that we do not specifically address this problem at [facility] but cardiovascular exercise would be  included with exercises)    Currently in Pain?  No/denies    Pain Onset  More than a month ago                       [facility] Adult PT Treatment/Exercise - 07/13/19 0811      Transfers   Transfers  Sit to Stand;Stand to Sit    Number of Reps  Other reps (comment)   5   Comments  feet on Airex      Ambulation/Gait   Ambulation/Gait  Yes    Ambulation/Gait Assistance  5: Supervision    Ambulation/Gait Assistance Details  230   230, 115, 230, 230'   Assistive device  None    Gait Pattern  Within Functional Limits    Ambulation Surface  Level;Indoor      Knee/Hip Exercises: Aerobic   Recumbent Bike  SciFit level 2.5 x 5 1/2" with intermittent rest breaks - w/UE's & LE's      Knee/Hip  Exercises: Machines for Strengthening   Cybex Leg Press  50# bil. LE's 3 sets 10 reps      Knee/Hip Exercises: Standing   Forward Step Up  Right;Left;1 set;5 reps;Step Height: 6";Hand Hold: 2          Balance Exercises - 07/13/19 2035      Balance Exercises: Standing   Rockerboard  Anterior/posterior;EO;Other reps (comment);UE support   2 sets 10 reps   Sit to Stand  Standard surface;Foam/compliant surface   5 reps   Other Standing Exercises  stepping over and back of balance beam 5 reps each leg with minimal UE support on // bars        Obstacle course - 4 stepping stones, 2 orange hurdles - pt needed min HHA for safety and security to decr. Fear of falling With this activity; to fascilitate SLS on each leg   Cone taps (4) - tapping each foot to each cone 2 reps with min HHA to improve SLS and coordination on each leg       PT Long Term Goals - 07/13/19 2038      PT LONG TERM GOAL #1   Title  Pt. will amb. 500' on flat, even surface without LOB.      Time  6    Period  Weeks    Status  New      PT LONG TERM GOAL #2   Title  Pt will perform at least 10" on recumbent bike nonstop for improved endurance/activity tolerance.    Time  6    Period  Weeks    Status  New      PT LONG TERM GOAL #3   Title  Mother will verbalize understanding of HEP to assist with LE strengthening.      Time  6    Period  Weeks    Status  New      PT LONG TERM GOAL #4   Title  Perform sit to stand 5 reps without UE support from mat to demo increased bil. LE strength.    Time  6    Period  Weeks    Status  New            Plan - 07/13/19 2041    Clinical Impression Statement  Pt tolerated exercises well with no occurrences of knees buckling during today's PT session.  Pt did exhibit internal rotation of LLE with lifting leg to step over balance beam -  able to correct with less internal rotation (but not to neutral) with verbal cues.    Personal Factors and Comorbidities  Behavior  Pattern;Past/Current Experience;Fitness;Comorbidity 2;Time since onset of injury/illness/exacerbation    Examination-Activity Limitations  Stand;Locomotion Level;Bend;Squat;Stairs    Examination-Participation Restrictions  Community Activity;Interpersonal Relationship;Shop    Stability/Clinical Decision Making  Stable/Uncomplicated    Rehab Potential  Good    PT Frequency  1x / week    PT Duration  6 weeks    PT Treatment/Interventions  ADLs/Self Care Home Management;Balance training;Gait training;Neuromuscular re-education;Stair training;Patient/family education;Therapeutic activities;Therapeutic exercise    PT Next Visit Plan  gait train, balance exercises, SciFit    Consulted and Agree with Plan of Care  Patient;Family member/caregiver    Family Member Consulted  mother [name]       Patient will benefit from skilled therapeutic intervention in order to improve the following deficits and impairments:  Abnormal gait, Decreased balance, Decreased activity tolerance, Decreased strength, Pain, Decreased cognition  Visit Diagnosis: Other abnormalities of gait and mobility  Muscle weakness (generalized)  Unsteadiness on feet     Problem List Patient Active Problem List   Diagnosis Date Noted  . Complex care coordination 05/18/2019  . Screening for cervical cancer 02/26/2019  . Glucosuria 06/01/2018  . Steroid-induced diabetes ([place]) 06/01/2018  . Not well controlled moderate persistent asthma 04/29/2018  . Gastroesophageal reflux disease 04/29/2018  . Recurrent infections 04/29/2018  . Dysuria 02/20/2018  . Chronic rhinitis 04/03/2017  . Recurrent falls 03/21/2017  . Weakness 03/21/2017  . Venous insufficiency 03/21/2017  . OSA on CPAP 09/30/2016  . Excessive daytime sleepiness 03/05/2016  . Bronchitis 05/05/2015  . 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 Cough variant asthma vs UACS/ vcd  03/15/2015  . Autism spectrum disorder 02/14/2015  . Nausea with vomiting  02/14/2015  . Obesity, morbid ([place]) 12/13/2014  . 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 Acanthosis nigricans 11/14/2014  . Toeing-in 08/29/2014  . Generalized convulsive epilepsy ([place]) 09/28/2012  . Partial epilepsy with impairment of consciousness ([place]) 09/28/2012  . 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 Recurrent urinary tract infection 08/01/2011  . Asthma 08/01/2011  . Chronic constipation 08/01/2011  . Contraception 08/01/2011    Alda Lea, PT 07/13/2019, 8:46 PM  [facility] [address] [place], [place], [address] Phone: [phone]   Fax:  [phone]  Name: AMALIA EDGECOMBE MRN: [id number] Date of Birth: [birth year]

## 2019-07-14 ENCOUNTER — Other Ambulatory Visit (INDEPENDENT_AMBULATORY_CARE_PROVIDER_SITE_OTHER): Payer: Self-pay | Admitting: Family

## 2019-07-14 ENCOUNTER — Telehealth (INDEPENDENT_AMBULATORY_CARE_PROVIDER_SITE_OTHER): Payer: Self-pay | Admitting: Family

## 2019-07-14 DIAGNOSIS — G40309 Generalized idiopathic epilepsy and epileptic syndromes, not intractable, without status epilepticus: Secondary | ICD-10-CM

## 2019-07-14 DIAGNOSIS — G40209 Localization-related (focal) (partial) symptomatic epilepsy and epileptic syndromes with complex partial seizures, not intractable, without status epilepticus: Secondary | ICD-10-CM

## 2019-07-14 MED ORDER — DEPAKOTE 500 MG PO TBEC: DELAYED_RELEASE_TABLET | ORAL | 5 refills | Status: DC

## 2019-07-14 NOTE — Telephone Encounter (Signed)
Please send to the pharmacy °

## 2019-07-14 NOTE — Telephone Encounter (Signed)
Mom contacted me to request Depakote refill. I sent in the refill as requested. TG

## 2019-07-20 ENCOUNTER — Other Ambulatory Visit: Payer: Self-pay

## 2019-07-20 ENCOUNTER — Ambulatory Visit: Payer: Medicare Other | Attending: Family | Admitting: Physical Therapy

## 2019-07-20 DIAGNOSIS — R2681 Unsteadiness on feet: Secondary | ICD-10-CM | POA: Insufficient documentation

## 2019-07-20 DIAGNOSIS — M6281 Muscle weakness (generalized): Secondary | ICD-10-CM | POA: Insufficient documentation

## 2019-07-20 DIAGNOSIS — R2689 Other abnormalities of gait and mobility: Secondary | ICD-10-CM | POA: Diagnosis not present

## 2019-07-21 ENCOUNTER — Encounter: Payer: Self-pay | Admitting: Physical Therapy

## 2019-07-21 NOTE — Therapy (Signed)
[facility] [address] [place], [place], [address] Phone: [phone]   Fax:  [phone]  Physical Therapy Treatment  Patient Details  Name: Lori Miles MRN: [id number] Date of Birth: 1995-08-06 Referring Provider (PT): Rockwell Germany, NP   Encounter Date: 07/20/2019  PT End of Session - 07/20/19 0845    Visit Number  7    Number of Visits  11    Date for PT Re-Evaluation  10/15/19    Authorization Type  UHC Medicare    Authorization Time Period  06-01-19 - 08-30-19; 07-20-19 - 7- 5-21    PT Start Time  0801    PT Stop Time  0846    PT Time Calculation (min)  45 min    Activity Tolerance  Patient tolerated treatment well    Behavior During Therapy  [facility] for tasks assessed/performed       Past Medical History:  Diagnosis Date  . Asthma   . Autism    mom states no actual dx, but admits she has some symptoms  . Bacteriuria   . Chronic constipation   . Cognitive developmental delay   . Constipation   . Disruptive behavior disorder   . Dyspnea    if she walks much  . Family history of adverse reaction to anesthesia     "hives"-? if anesthesia did take antibiotic  . Frequency-urgency syndrome   . Gait disorder    ORGANIC PER NERUOLGIST NOTE (DR HICKLING)  . Generalized convulsive epilepsy ([place]) NEUROLOGIST-  DR HICKLING  . GERD (gastroesophageal reflux disease)   . Gout   . H/O sinus tachycardia   . History of acute respiratory failure 02/14/2015   SECONDARY TO CAP  . History of recurrent UTIs   . Interstitial cystitis   . Moderate intellectual disability   . OSA (obstructive sleep apnea)    no machine yet   . Partial epilepsy with impairment of consciousness ([place])    FOLLOWED BY DR HICKLING  . Pneumonia 2    4 times this year ( 2020)  . PONV (postoperative nausea and vomiting)   . Seizure ([place])    most recent sz " at the end of summer"-last sz years ago per mother  . Urinary incontinence      Past Surgical History:  Procedure Laterality Date  . CYSTOSCOPY N/A 06/03/2017   Procedure: CYSTOSCOPY WITH EXAM UNDER ANESTHESIA;  Surgeon: Festus Aloe, MD;  Location: [facility];  Service: Urology;  Laterality: N/A;  . CYSTOSCOPY  2020  . CYSTOSCOPY WITH HYDRODISTENSION AND BIOPSY  04/14/2012   Procedure: CYSTOSCOPY/BIOPSY/HYDRODISTENSION;  Surgeon: Hanley Ben, MD;  Location: [place];  Service: Urology;;  instillation of marcaine and pyridium  . EUA/  PAP SMEAR/  BREAST EXAM  03-12-2016   dr Ihor Dow [facility]  . EUA/ VAGINOSCOPY/ COLPOSCOPY FO THE HYMENAL RING  10-04-2009    dr Delsa Sale  [facility]  . TONSILLECTOMY      There were no vitals filed for this visit.  Subjective Assessment - 07/21/19 1436    Subjective  [name] states pt has had a good week - no problems or changes reported    Patient is accompained by:  Family member   mother, [name]   Pertinent History  Autism;  Developmental Delay; Generalized convulsive epilepsy; seizures; Recurrent falls    Patient Stated Goals  improve strength; improve balance; try to improve breathing function (I informed pt's mother that we do not specifically address  this problem at [facility] but cardiovascular exercise would be included with exercises)    Currently in Pain?  No/denies    Pain Onset  More than a month ago                       [facility] Adult PT Treatment/Exercise - 07/21/19 0001      Transfers   Transfers  Sit to Stand    Number of Reps  Other reps (comment)   5 reps   Comments  feet on Airex - no UE support used; CGA for safety but no major LOB occurred with this activity      Ambulation/Gait   Ambulation/Gait  Yes    Ambulation/Gait Assistance  5: Supervision    Ambulation/Gait Assistance Details  rollator was raised 2 notches to fascilitate more upright posture during use of this device with ambulation    Ambulation Distance (Feet)  230 Feet   additional 4 laps  (460') without use of rollator -2 laps x 2   Assistive device  4-wheeled walker    Gait Pattern  Within Functional Limits    Ambulation Surface  Level;Indoor      Neuro Re-ed    Neuro Re-ed Details   pt performed standing balance activity with obstacle course - used 4 stepping stones to fascilitate increased step length and SLS, 2 balance bubbles for tapping the top of each while standing on blue Airex pad for compliant surface training, and 4 cones for alternate tapping the top of each one for improved SLS      Knee/Hip Exercises: Stretches   Active Hamstring Stretch  Right;Left;1 rep;30 seconds   runner's stretch     Knee/Hip Exercises: Aerobic   Recumbent Bike  SciFit level 2.5 x 5" with 1 short rest break - w/UE's & LE's      Knee/Hip Exercises: Machines for Strengthening   Cybex Leg Press  50# bil. LE's 3 sets 10 reps      Knee/Hip Exercises: Standing   Forward Step Up  Right;Left;1 set;Hand Hold: 2;10 reps          Balance Exercises - 07/21/19 1446      Balance Exercises: Standing   Rockerboard  Anterior/posterior;EO;Other reps (comment);UE support   2 sets 10 reps            PT Long Term Goals - 07/21/19 1447      PT LONG TERM GOAL #1   Title  Pt. will amb. 500' on flat, even surface without LOB.      Baseline  350'without use of device prior to pt needing seated rest break    Time  6    Period  Weeks    Status  On-going    Target Date  08/20/19      PT LONG TERM GOAL #2   Title  Pt will perform at least 10" on recumbent bike nonstop for improved endurance/activity tolerance.    Baseline  5" with 1 short rest break on 07-20-19    Time  6    Period  Weeks    Status  On-going    Target Date  08/30/19      PT LONG TERM GOAL #3   Title  Mother will verbalize understanding of HEP to assist with LE strengthening.      Time  6    Period  Weeks    Status  Achieved      PT LONG TERM GOAL #4  Title  Perform sit to stand 5 reps without UE support from mat  to demo increased bil. LE strength.    Baseline  met 07-20-19    Time  6    Period  Weeks    Status  Achieved            Plan - 07/21/19 1451    Clinical Impression Statement  Pt has met LTG's #3 and 4:  LTG's #1 and 2 are ongoing as pt has not fully met - pt able to amb. 3 laps on track (350') and then requires seated rest break;  pt able to perform 5" on recumbent bike with 1 short rest period - pt unable to perform cycling on bike for 10" due to decreased activity tolerance/decr. endurance.    PT Frequency  1x / week    PT Duration  4 weeks    PT Treatment/Interventions  ADLs/Self Care Home Management;Balance training;Gait training;Neuromuscular re-education;Stair training;Patient/family education;Therapeutic activities;Therapeutic exercise    PT Next Visit Plan  gait train, balance exercises, SciFit    Consulted and Agree with Plan of Care  Patient;Family member/caregiver    Family Member Consulted  mother [name]       Patient will benefit from skilled therapeutic intervention in order to improve the following deficits and impairments:     Visit Diagnosis: Other abnormalities of gait and mobility - Plan: PT plan of care cert/re-cert  Muscle weakness (generalized) - Plan: PT plan of care cert/re-cert  Unsteadiness on feet - Plan: PT plan of care cert/re-cert     Problem List Patient Active Problem List   Diagnosis Date Noted  . Complex care coordination 05/18/2019  . Screening for cervical cancer 02/26/2019  . Glucosuria 06/01/2018  . Steroid-induced diabetes ([place]) 06/01/2018  . Not well controlled moderate persistent asthma 04/29/2018  . Gastroesophageal reflux disease 04/29/2018  . Recurrent infections 04/29/2018  . Dysuria 02/20/2018  . Chronic rhinitis 04/03/2017  . Recurrent falls 03/21/2017  . Weakness 03/21/2017  . Venous insufficiency 03/21/2017  . OSA on CPAP 09/30/2016  . Excessive daytime sleepiness 03/05/2016  . Bronchitis 05/05/2015  . Disruptive  behavior disorder 03/22/2015  . Cough variant asthma vs UACS/ vcd  03/15/2015  . Autism spectrum disorder 02/14/2015  . Nausea with vomiting 02/14/2015  . Obesity, morbid ([place]) 12/13/2014  . 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 Acanthosis nigricans 11/14/2014  . Toeing-in 08/29/2014  . Generalized convulsive epilepsy ([place]) 09/28/2012  . Partial epilepsy with impairment of consciousness ([place]) 09/28/2012  . 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 Recurrent urinary tract infection 08/01/2011  . Asthma 08/01/2011  . Chronic constipation 08/01/2011  . Contraception 08/01/2011    Alda Lea, [place] 07/21/2019, 3:03 PM  [facility] [address] [place], [place], [address] Phone: [phone]   Fax:  [phone]  Name: Lori Miles MRN: [id number] Date of Birth: [birth year]

## 2019-07-27 ENCOUNTER — Encounter: Payer: Self-pay | Admitting: Physical Therapy

## 2019-07-27 ENCOUNTER — Other Ambulatory Visit: Payer: Self-pay

## 2019-07-27 ENCOUNTER — Ambulatory Visit: Payer: Medicare Other | Admitting: Physical Therapy

## 2019-07-27 ENCOUNTER — Other Ambulatory Visit: Payer: Self-pay | Admitting: Urology

## 2019-07-27 DIAGNOSIS — R2689 Other abnormalities of gait and mobility: Secondary | ICD-10-CM | POA: Diagnosis not present

## 2019-07-27 DIAGNOSIS — R2681 Unsteadiness on feet: Secondary | ICD-10-CM

## 2019-07-27 DIAGNOSIS — N281 Cyst of kidney, acquired: Secondary | ICD-10-CM

## 2019-07-27 DIAGNOSIS — M6281 Muscle weakness (generalized): Secondary | ICD-10-CM

## 2019-07-28 ENCOUNTER — Encounter: Payer: Self-pay | Admitting: Physical Therapy

## 2019-07-28 NOTE — Therapy (Signed)
[facility] [address] [place], [place], [address] Phone: [phone]   Fax:  [phone]  Physical Therapy Treatment  Patient Details  Name: Lori Miles MRN: [id number] Date of Birth: 1995/04/28 Referring Provider (PT): Rockwell Germany, NP   Encounter Date: 07/27/2019  PT End of Session - 07/28/19 2004    Visit Number  8    Number of Visits  11    Date for PT Re-Evaluation  10/15/19    Authorization Type  [employer] Medicare    Authorization Time Period  06-01-19 - 08-30-19; 07-20-19 - 7- 5-21    PT Start Time  0731    PT Stop Time  0817    PT Time Calculation (min)  46 min    Activity Tolerance  Patient tolerated treatment well    Behavior During Therapy  [facility] for tasks assessed/performed       Past Medical History:  Diagnosis Date  . Asthma   . Autism    mom states no actual dx, but admits she has some symptoms  . Bacteriuria   . Chronic constipation   . Cognitive developmental delay   . Constipation   . Disruptive behavior disorder   . Dyspnea    if she walks much  . Family history of adverse reaction to anesthesia     "hives"-? if anesthesia did take antibiotic  . Frequency-urgency syndrome   . Gait disorder    ORGANIC PER NERUOLGIST NOTE (DR HICKLING)  . Generalized convulsive epilepsy ([place]) NEUROLOGIST-  DR HICKLING  . GERD (gastroesophageal reflux disease)   . Gout   . H/O sinus tachycardia   . History of acute respiratory failure 02/14/2015   SECONDARY TO CAP  . History of recurrent UTIs   . Interstitial cystitis   . Moderate intellectual disability   . OSA (obstructive sleep apnea)    no machine yet   . Partial epilepsy with impairment of consciousness ([place])    FOLLOWED BY DR HICKLING  . Pneumonia 2    4 times this year ( 2020)  . PONV (postoperative nausea and vomiting)   . Seizure ([employer])    most recent sz " at the end of summer"-last sz years ago per mother  . Urinary incontinence      Past Surgical History:  Procedure Laterality Date  . CYSTOSCOPY N/A 06/03/2017   Procedure: CYSTOSCOPY WITH EXAM UNDER ANESTHESIA;  Surgeon: Festus Aloe, MD;  Location: [facility];  Service: Urology;  Laterality: N/A;  . CYSTOSCOPY  2020  . CYSTOSCOPY WITH HYDRODISTENSION AND BIOPSY  04/14/2012   Procedure: CYSTOSCOPY/BIOPSY/HYDRODISTENSION;  Surgeon: Hanley Ben, MD;  Location: [facility];  Service: Urology;;  instillation of marcaine and pyridium  . EUA/  PAP SMEAR/  BREAST EXAM  03-12-2016   dr Ihor Dow [facility]  . EUA/ VAGINOSCOPY/ COLPOSCOPY FO THE HYMENAL RING  10-04-2009    dr Delsa Sale  [facility]  . TONSILLECTOMY      There were no vitals filed for this visit.  Subjective Assessment - 07/28/19 1958    Subjective  [name] reports no changes with pt - no falls    Patient is accompained by:  Family member   mother, [name]   Pertinent History  Autism;  Developmental Delay; Generalized convulsive epilepsy; seizures; Recurrent falls    Patient Stated Goals  improve strength; improve balance; try to improve breathing function (I informed pt's mother that we do not specifically address this problem at [facility] but  cardiovascular exercise would be included with exercises)    Currently in Pain?  No/denies    Pain Onset  More than a month ago                        [facility] Adult PT Treatment/Exercise - 07/28/19 0001      Transfers   Transfers  Sit to Stand    Comments  feet on Airex - no UE support used; CGA for safety but no major LOB occurred with this activity      Ambulation/Gait   Ambulation/Gait  Yes    Ambulation/Gait Assistance  5: Supervision    Ambulation Distance (Feet)  230 Feet   230', 115' - 575' total distance   Assistive device  None    Gait Pattern  Within Functional Limits    Ambulation Surface  Level;Indoor      Knee/Hip Exercises: Aerobic   Recumbent Bike  SciFit level 2.5 x 5" with 2 short rest breaks -  w/UE's & LE's      Knee/Hip Exercises: Machines for Strengthening   Cybex Leg Press  50# bil. LE's 3 sets 10 reps      Knee/Hip Exercises: Standing   Forward Step Up  Right;Left;1 set;Hand Hold: 2;5 reps    Other Standing Knee Exercises  pt performed standing hip extension and abduction bil. LE's 2# weight 10 reps each leg each exercise       Obstacle course - 4 stepping stones, 2 orange hurdles - pt needed min HHA for safety and security to decr. Fear of falling With this activity; to fascilitate SLS on each leg   Cone taps (4) - tapping each foot to each cone 2 reps with min HHA to improve SLS and coordination on each leg               PT Long Term Goals - 07/28/19 2007      PT LONG TERM GOAL #1   Title  Pt. will amb. 500' on flat, even surface without LOB.      Baseline  350'without use of device prior to pt needing seated rest break    Time  6    Period  Weeks    Status  On-going      PT LONG TERM GOAL #2   Title  Pt will perform at least 10" on recumbent bike nonstop for improved endurance/activity tolerance.    Baseline  5" with 1 short rest break on 07-20-19    Time  6    Period  Weeks    Status  On-going      PT LONG TERM GOAL #3   Title  Mother will verbalize understanding of HEP to assist with LE strengthening.      Time  6    Period  Weeks    Status  Achieved      PT LONG TERM GOAL #4   Title  Perform sit to stand 5 reps without UE support from mat to demo increased bil. LE strength.    Baseline  met 07-20-19    Time  6    Period  Weeks    Status  Achieved            Plan - 07/28/19 2005    Clinical Impression Statement  Pt had decreased participation in today's session with pt reporting Lt foot pain at times with slightly antalgic gait noted during session (occurred intermittently).  Lt ankle area  appeared to be slightly swollen with some puffiness noted compared to her Rt ankle area.    PT Frequency  1x / week    PT Duration  4 weeks    PT  Treatment/Interventions  ADLs/Self Care Home Management;Balance training;Gait training;Neuromuscular re-education;Stair training;Patient/family education;Therapeutic activities;Therapeutic exercise    PT Next Visit Plan  gait train, balance exercises, SciFit    Consulted and Agree with Plan of Care  Patient;Family member/caregiver    Family Member Consulted  mother [name]       Patient will benefit from skilled therapeutic intervention in order to improve the following deficits and impairments:     Visit Diagnosis: Other abnormalities of gait and mobility  Muscle weakness (generalized)  Unsteadiness on feet     Problem List Patient Active Problem List   Diagnosis Date Noted  . Complex care coordination 05/18/2019  . Screening for cervical cancer 02/26/2019  . Glucosuria 06/01/2018  . Steroid-induced diabetes ([place]) 06/01/2018  . Not well controlled moderate persistent asthma 04/29/2018  . Gastroesophageal reflux disease 04/29/2018  . Recurrent infections 04/29/2018  . Dysuria 02/20/2018  . Chronic rhinitis 04/03/2017  . Recurrent falls 03/21/2017  . Weakness 03/21/2017  . Venous insufficiency 03/21/2017  . OSA on CPAP 09/30/2016  . Excessive daytime sleepiness 03/05/2016  . Bronchitis 05/05/2015  . 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 Cough variant asthma vs UACS/ vcd  03/15/2015  . Autism spectrum disorder 02/14/2015  . Nausea with vomiting 02/14/2015  . Obesity, morbid ([place]) 12/13/2014  . 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 Acanthosis nigricans 11/14/2014  . Toeing-in 08/29/2014  . Generalized convulsive epilepsy ([place]) 09/28/2012  . Partial epilepsy with impairment of consciousness ([place]) 09/28/2012  . 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 Recurrent urinary tract infection 08/01/2011  . Asthma 08/01/2011  . Chronic constipation 08/01/2011  .  Contraception 08/01/2011    Alda Lea, PT 07/28/2019, 8:10 PM  [facility] [address] [place], [place], [address] Phone: [phone]   Fax:  [phone]  Name: Lori Miles MRN: [id number] Date of Birth: 03-13-1996

## 2019-07-29 ENCOUNTER — Ambulatory Visit
Admission: RE | Admit: 2019-07-29 | Discharge: 2019-07-29 | Disposition: A | Discharge status: Home / Self Care | Payer: Medicare Other | Source: Ambulatory Visit | Attending: Urology | Admitting: Urology

## 2019-07-29 ENCOUNTER — Encounter: Payer: Self-pay | Admitting: *Deleted

## 2019-07-29 ENCOUNTER — Other Ambulatory Visit: Payer: Self-pay

## 2019-07-29 DIAGNOSIS — N281 Cyst of kidney, acquired: Secondary | ICD-10-CM

## 2019-07-29 HISTORY — PX: IR RADIOLOGIST EVAL & MGMT: IMG5224

## 2019-07-29 NOTE — Consult Note (Signed)
Chief Complaint: Right renal cyst, symptomatic    Referring Physician(s): Dr Alona Bene, [facility] Dr. Nyra Capes, [facility]  History of Present Illness: Lori Miles is a 24 y.o. female presenting as a scheduled consultation to [facility] clinic today, kindly referred by Dr. Amalia Hailey of [facility], for evaluation for potential repeat image guided aspiration and possible sclerosis of recurrent right renal cyst.    Lori Miles's mother spoke with [name] via a telemedicine phone call, given the current COVID situation.  I confirmed the patient's identity with 2 identifiers.   Lori Miles is a young woman with autism, and her mother tells me that the primary problem with her urinary symptoms is recurrent UTI and cystitis.  It sounds like she is on continuous suppressive ABX therapy, with macrobid prescription.    She is a young woman with a right renal cyst that we have previously aspirated with image guidance, performed 04/01/2014 by my partner Dr. Laurence Ferrari.  Cyst was estimated 5.7cm x 4.5cm.  Moderate This aspirate yielded ~100cc of fluid.  A culture was apparently not sent.  Her mother tells me that she is confident that after that aspirate, her daughter acted different and seemed to get some relief.    She is also convinced that her current behavior tells her that she might be becoming symptomatic again from the right renal cyst.  She is concerned that the cyst might be causing alterations in her toileting behavior -- potentially causing her to void when otherwise she would not, or hold her urine longer than she might otherwise.    She has not had any recent fever.  She continues to see [facility].  We do not have updated imaging, but apparently Lori Miles has completed some [name] or CT imaging recently which shows [name] the right renal cyst has recurred.    Of note, Lori Miles's mother is concerned that moderate sedation will not be adequate for our procedure, despite that  was our strategy last time. She believes that only GETA will work to keep her safe and comfortable.    Past Medical History:  Diagnosis Date  . Asthma   . Autism    mom states no actual dx, but admits she has some symptoms  . Bacteriuria   . Chronic constipation   . Cognitive developmental delay   . Constipation   . Disruptive behavior disorder   . Dyspnea    if she walks much  . Family history of adverse reaction to anesthesia     "hives"-? if anesthesia did take antibiotic  . Frequency-urgency syndrome   . Gait disorder    ORGANIC PER NERUOLGIST NOTE (DR HICKLING)  . Generalized convulsive epilepsy ([place]) NEUROLOGIST-  DR HICKLING  . GERD (gastroesophageal reflux disease)   . Gout   . H/O sinus tachycardia   . History of acute respiratory failure 02/14/2015   SECONDARY TO CAP  . History of recurrent UTIs   . Interstitial cystitis   . Moderate intellectual disability   . OSA (obstructive sleep apnea)    no machine yet   . Partial epilepsy with impairment of consciousness ([place])    FOLLOWED BY DR HICKLING  . Pneumonia 2    4 times this year ( 2020)  . PONV (postoperative nausea and vomiting)   . Seizure ([place])    most recent sz " at the end of summer"-last sz years ago per mother  . Urinary incontinence     Past Surgical  History:  Procedure Laterality Date  . CYSTOSCOPY N/A 06/03/2017   Procedure: CYSTOSCOPY WITH EXAM UNDER ANESTHESIA;  Surgeon: Festus Aloe, MD;  Location[facility];  Service: Urology;  Laterality: N/A;  . CYSTOSCOPY  2020  . CYSTOSCOPY WITH HYDRODISTENSION AND BIOPSY  04/14/2012   Procedure: CYSTOSCOPY/BIOPSY/HYDRODISTENSION;  Surgeon: Hanley Ben, MD;  Location: [facility];  Service: Urology;;  instillation of marcaine and pyridium  . EUA/  PAP SMEAR/  BREAST EXAM  03-12-2016   dr Ihor Dow [facility]  . EUA/ VAGINOSCOPY/ COLPOSCOPY FO THE HYMENAL RING  10-04-2009    dr Delsa Sale  [facility]  . TONSILLECTOMY       Allergies: Abilify [aripiprazole], Seroquel [quetiapine fumarate], Amoxicillin-pot clavulanate, Bactrim [sulfamethoxazole-trimethoprim], Doxycycline, Macrobid [nitrofurantoin macrocrystal], and Keppra [levetiracetam]  Medications: Prior to Admission medications   Medication Sig Start Date End Date Taking? Authorizing Provider  acetaminophen (TYLENOL) 325 MG tablet Take 2 tablets (650 mg total) by mouth every 6 (six) hours as needed for mild pain or moderate pain. 02/27/17   Waynetta Pean, PA-C  albuterol (PROVENTIL) (2.5 MG/3ML) 0.083% nebulizer solution USE 1 VIAL IN NEBULIZER EVERY 4 TO 6 HOURS AS NEEDED FOR COUGH 06/24/19   Valentina Shaggy, MD  amoxicillin (AMOXIL) 500 MG tablet Take 500 mg by mouth 3 (three) times daily. 05/21/19   [provider]  benzonatate (TESSALON) 200 MG capsule TAKE 1 CAPSULE(200 MG) BY MOUTH THREE TIMES DAILY AS NEEDED Patient not taking: No sig reported 08/26/18   Hoyt Koch, MD  budesonide (PULMICORT) 1 MG/2ML nebulizer solution Inhale 1 vial 4 times per day, during upper respiratory infections and asthma flares for 1-2 weeks Patient taking differently: Take 1 mg by nebulization 4 (four) times daily. Inhale 1 vial 4 times per day, during upper respiratory infections and asthma flares for 1-2 weeks 11/20/18   Ambs, Kathrine Cords, FNP  cephALEXin (KEFLEX) 250 MG capsule Take 250 mg by mouth daily. 05/04/19   [provider]  cetirizine (ZYRTEC) 10 MG tablet TAKE 1 TABLET(10 MG) BY MOUTH DAILY 06/03/19   Kozlow, Donnamarie Poag, MD  clotrimazole-betamethasone (LOTRISONE) cream Apply to affected area 2 times daily prn Patient not taking: Reported on 05/18/2019 06/29/18   Robyn Haber, MD  DEPAKOTE 500 MG DR tablet TAKE 1 TABLET BY MOUTH IN THE MORNING AND 2 AT NIGHT 07/14/19   Rockwell Germany, NP  famotidine (PEPCID) 20 MG tablet Take 1 tablet (20 mg total) by mouth every 12 (twelve) hours. 01/21/19   Pyrtle, Lajuan Lines, MD  fluconazole (DIFLUCAN) 150 MG  tablet Take 150 mg by mouth as needed (when on antibiotics).    [provider]  fosfomycin (MONUROL) 3 g PACK Take 3 grams by mouth  every 3 DAYS for 4 doses Patient not taking: Reported on 05/18/2019 03/29/19   Carlyle Basques, MD  hydrOXYzine (ATARAX/VISTARIL) 25 MG tablet Take 25 mg by mouth 3 (three) times daily as needed (pain).  01/13/19   [provider]  Levonorgestrel-Ethinyl Estradiol (SEASONIQUE) 0.15-0.03 &0.01 MG tablet Take 1 tablet by mouth daily. 01/28/19   Lajean Manes, CNM  Melatonin 3 MG TABS Take 3 mg by mouth at bedtime.     [provider]  mometasone (NASONEX) 50 MCG/ACT nasal spray Use one spray in each nostril once daily as directed. 05/06/19   Kozlow, Donnamarie Poag, MD  montelukast (SINGULAIR) 10 MG tablet TAKE 1 TABLET(10 MG) BY MOUTH AT BEDTIME 06/03/19   Kozlow, Donnamarie Poag, MD  Nebulizer MISC Adult  mask with tubing. Use as directed with nebulizer device. 11/18/18   Kozlow, Donnamarie Poag, MD  ondansetron (ZOFRAN ODT) 4 MG disintegrating tablet Take 1 tablet (4 mg total) by mouth every 8 (eight) hours as needed for nausea. Patient taking differently: Take 8 mg by mouth every 8 (eight) hours as needed for nausea.  06/07/18   Larene Pickett, PA-C  PERFOROMIST 20 MCG/2ML nebulizer solution USE 2 MLS BY NEBULIZATION TWICE DAILY 06/10/19   Kozlow, Donnamarie Poag, MD  polyethylene glycol powder (GLYCOLAX/MIRALAX) 17 GM/SCOOP powder Dissolve 17 grams (1 capful) in at least 8 ounces of water/juice and drink twice daily 02/15/19   Pyrtle, Lajuan Lines, MD  predniSONE (DELTASONE) 10 MG tablet Take three tablets twice daily for 7 days. 05/18/19   Valentina Shaggy, MD  promethazine (PHENERGAN) 25 MG suppository Place 1 suppository (25 mg total) rectally every 6 (six) hours as needed for nausea or vomiting. 04/28/19   Deno Etienne, DO  promethazine (PHENERGAN) 25 MG tablet Take 1 tablet (25 mg total) by mouth every 6 (six) hours as needed for nausea or vomiting. 04/28/19   Deno Etienne, DO  senna  (SENOKOT) 8.6 MG tablet Take 1 tablet by mouth at bedtime.     [provider]  terconazole (TERAZOL 7) 0.4 % vaginal cream Place 1 applicator vaginally at bedtime. Patient taking differently: Place 1 applicator vaginally at bedtime as needed (yeast).  01/28/19   Lajean Manes, CNM  traZODone (DESYREL) 50 MG tablet Take 2 tablets (100 mg total) by mouth at bedtime. 03/10/19   Rockwell Germany, NP  YUPELRI 175 MCG/3ML SOLN USE 1 VIAL VIA NEBULIZER DAILY Patient taking differently: Inhale 3 mLs into the lungs daily.  05/13/19   Kozlow, Donnamarie Poag, MD     Family History  Problem Relation Age of Onset  . Seizures Mother   . Seizures Sister        1 sister has  . Pancreatic cancer Sister   . Colon cancer Neg Hx   . Colon polyps Neg Hx   . Esophageal cancer Neg Hx   . Rectal cancer Neg Hx   . Stomach cancer Neg Hx     Social History   Socioeconomic History  . Marital status: Single    Spouse name: Not on file  . Number of children: Not on file  . Years of education: Not on file  . Highest education level: Not on file  Occupational History  . Not on file  Tobacco Use  . Smoking status: Never Smoker  . Smokeless tobacco: Never Used  Substance and Sexual Activity  . Alcohol use: No    Alcohol/week: 0.0 standard drinks  . Drug use: No  . Sexual activity: Never    Birth control/protection: Pill  Other Topics Concern  . Not on file  Social History Narrative   Lives with mom ([name] Llamas) and half-sister Priya Matsen) who is also mentally impaired.  Has CAP assistance, urinary incontinence, needs help with feeding,dressing, toileting   Graduated from [employer].  She enjoys playing with cars, dancing, and dogs.   Social Determinants of Health   Financial Resource Strain:   . Difficulty of Paying Living Expenses:   Food Insecurity:   . Worried About Charity fundraiser in the Last Year:   . Arboriculturist in the Last Year:   Transportation Needs:   .  Film/video editor (Medical):   Marland Kitchen Lack of Transportation (Non-Medical):   Physical  Activity:   . Days of Exercise per Week:   . Minutes of Exercise per Session:   Stress:   . Feeling of Stress :   Social Connections:   . Frequency of Communication with Friends and Family:   . Frequency of Social Gatherings with Friends and Family:   . Attends Religious Services:   . Active Member of Clubs or Organizations:   . Attends Archivist Meetings:   Marland Kitchen Marital Status:        Review of Systems  Review of Systems: A 12 point ROS discussed and pertinent positives are indicated in the HPI above.  All other systems are negative.  Physical Exam No direct physical exam was performed (except for noted visual exam findings with Video Visits).    Vital Signs: There were no vitals taken for this visit.  Imaging: No results found.  Labs:  CBC: Recent Labs    03/03/19 0732 04/27/19 1025 05/09/19 1344 05/17/19 1156  WBC 9.1 16.3* 15.5* 14.1*  HGB 12.6 12.5 11.9* 11.8*  HCT 37.5 38.6 36.9 35.2*  PLT 254 257 224 231    COAGS: Recent Labs    04/26/19 1038  INR 1.1*    BMP: Recent Labs    03/03/19 0848 04/27/19 1025 05/09/19 1344 05/17/19 1156  NA 136 137 139 137  K 3.9 4.1 4.1 3.5  CL 102 106 106 106  CO2 22 18* 23 23  GLUCOSE 81 169* 102* 107*  BUN 9 13 9 16   CALCIUM 9.0 8.9 8.5* 8.2*  CREATININE 0.87 0.99 0.76 0.89  GFRNONAA >60 >60 >60 >60  GFRAA >60 >60 >60 >60    LIVER FUNCTION TESTS: Recent Labs    09/10/18 0003 04/27/19 1025 05/09/19 1344  BILITOT 0.2* 0.6 0.4  AST 15 26 17   ALT 14 22 16   ALKPHOS 37* 42 39  PROT 6.4* 6.4* 5.8*  ALBUMIN 3.1* 3.0* 2.6*    TUMOR MARKERS: No results for input(s): AFPTM, CEA, CA199, CHROMGRNA in the last 8760 hours.  Assessment and Plan:  Lori Johnica Armwood is a 24 year old female with autism and Urologic symptoms of chronic UTI/cystitis.    She has a right renal cyst that is also thought to be  contributing to discomfort and altering her behaviors.   I had a lengthy conversation with her mother regarding imaging guided aspiration and possible sclerosis of renal cyst.  I was specific that while there is a chance that the cyst may be causing discomfort, there is no way to be confident that 1 -- there is pain related to the cyst, 2 -- there will be any noticeable difference after aspirating/sclerosing, and 3 -- that there will be any positive effect with her chronic UTI/cystitis.    While there is only low confidence that this strategy might help her other symptoms, I did let her know that there is overall low risk of performing an aspiration/drainage, and that is probably a reasonable thing to perform for her, in the chance that we see a positive response.    Regarding the risk profile for aspiration and possible sclerosis, the main risks include bleeding, infection, injury to adjacent structure, need for further procedure/surgery, including retreatment, non-target embolization/sclerosant, temporary hematuria.  Less likely wound include more severe risks related to anesthesia, risk of cardiopulmonary collapse, death.    Her mother is very confident that this cannot be performed with moderate sedation as it was previously, and that general anesthesia will be required.  After our discussion she would like to proceed.   Plan: - Plan for image guided right cyst aspiration and possible ETOH sclerosis, at [facility] in [place], using [name] and fluoro.  This may be with either Dr. Earleen Newport, or with Dr. Laurence Ferrari, who has met her previously.  - Plan for general anesthesia.  Patient will need to have visit pre-op with anesthesia - we will need to review updated imaging.  This can either be any recent (within 6 months) imaging from Dr. Ignacia Bayley office with [facility], or if need be, updated renal US with [name].   Thank you for this interesting consult.  I greatly enjoyed meeting Sanii J Vides  and look forward to participating in their care.  A copy of this report was sent to the requesting provider on this date.  Electronically Signed: Corrie Mckusick 07/29/2019, 2:51 PM   I spent a total of  40 Minutes   in remote  clinical consultation, greater than 50% of which was counseling/coordinating care for right renal cyst.    Visit type: Audio only (telephone). Audio (no video) only due to patient's lack of internet/smartphone capability. Alternative for in-person consultation at [facility], [place] [address] [place], [place], [place]. This visit type was conducted due to national recommendations for restrictions regarding the COVID-19 Pandemic (e.g. social distancing).  This format is felt to be most appropriate for this patient at this time.  All issues noted in this document were discussed and addressed.

## 2019-08-02 ENCOUNTER — Telehealth: Payer: Self-pay | Admitting: Allergy & Immunology

## 2019-08-02 NOTE — Telephone Encounter (Signed)
Dr. Dellis Anes please advise I did not see a mention of Flonase at the last visit.

## 2019-08-02 NOTE — Telephone Encounter (Signed)
Patient mom called and needs Flonase called into [facility] .[phone]

## 2019-08-03 MED ORDER — FLUTICASONE PROPIONATE 50 MCG/ACT NA SUSP: 1.0000 | Freq: Every day | NASAL | 5 refills | Status: DC

## 2019-08-03 NOTE — Addendum Note (Signed)
Addended by: Dollene Cleveland R on: 08/03/2019 01:46 PM   Modules accepted: Orders

## 2019-08-03 NOTE — Telephone Encounter (Signed)
Prescription has been sent in. Called and advised to the patient's mother. Patient's mother verbalized understanding.

## 2019-08-03 NOTE — Telephone Encounter (Signed)
I am fine with Flonase 1 spray per nostril daily.  Malachi Bonds, MD [facility]

## 2019-08-09 ENCOUNTER — Other Ambulatory Visit: Payer: Self-pay | Admitting: Urology

## 2019-08-09 ENCOUNTER — Ambulatory Visit
Admission: RE | Admit: 2019-08-09 | Discharge: 2019-08-09 | Disposition: A | Discharge status: Home / Self Care | Payer: Self-pay | Source: Ambulatory Visit | Attending: Urology | Admitting: Urology

## 2019-08-09 DIAGNOSIS — N281 Cyst of kidney, acquired: Secondary | ICD-10-CM

## 2019-08-24 ENCOUNTER — Ambulatory Visit: Payer: Medicare Other | Admitting: Physical Therapy

## 2019-08-25 ENCOUNTER — Telehealth: Payer: Self-pay | Admitting: Pulmonary Disease

## 2019-08-25 NOTE — Telephone Encounter (Signed)
Noted.  Coralyn Helling, MD [facility]monary/Critical Care Pager - [phone] 08/25/2019, 5:06 PM

## 2019-08-25 NOTE — Telephone Encounter (Signed)
FYI Dr. Sood 

## 2019-08-30 ENCOUNTER — Other Ambulatory Visit: Payer: Self-pay

## 2019-08-30 ENCOUNTER — Ambulatory Visit: Payer: Medicare Other | Attending: Family | Admitting: Physical Therapy

## 2019-08-30 DIAGNOSIS — R2681 Unsteadiness on feet: Secondary | ICD-10-CM

## 2019-08-30 DIAGNOSIS — R2689 Other abnormalities of gait and mobility: Secondary | ICD-10-CM | POA: Insufficient documentation

## 2019-08-30 DIAGNOSIS — M6281 Muscle weakness (generalized): Secondary | ICD-10-CM | POA: Insufficient documentation

## 2019-08-31 ENCOUNTER — Other Ambulatory Visit (HOSPITAL_COMMUNITY): Payer: Self-pay | Admitting: Interventional Radiology

## 2019-08-31 ENCOUNTER — Encounter: Payer: Self-pay | Admitting: Physical Therapy

## 2019-08-31 DIAGNOSIS — N390 Urinary tract infection, site not specified: Secondary | ICD-10-CM

## 2019-08-31 DIAGNOSIS — N3090 Cystitis, unspecified without hematuria: Secondary | ICD-10-CM

## 2019-08-31 NOTE — Therapy (Signed)
[facility] [address] [place], [place], [address] Phone: [phone]   Fax:  [phone]  Physical Therapy Treatment  Patient Details  Name: Lori Miles MRN: [id number] Date of Birth: [birth year] Referring Provider (PT): Rockwell Germany, NP   Encounter Date: 08/30/2019   PT End of Session - 08/31/19 1958    Visit Number 9    Number of Visits 11    Date for PT Re-Evaluation 10/15/19    Authorization Type U[employer] Medicare    Authorization Time Period 06-01-19 - 08-30-19; 07-20-19 - 7- 5-21    PT Start Time 0850    PT Stop Time 0932    PT Time Calculation (min) 42 min    Activity Tolerance Patient tolerated treatment well    Behavior During Therapy [facility] for tasks assessed/performed           Past Medical History:  Diagnosis Date  . Asthma   . Autism    mom states no actual dx, but admits she has some symptoms  . Bacteriuria   . Chronic constipation   . Cognitive developmental delay   . Constipation   . Disruptive behavior disorder   . Dyspnea    if she walks much  . Family history of adverse reaction to anesthesia     "hives"-? if anesthesia did take antibiotic  . Frequency-urgency syndrome   . Gait disorder    ORGANIC PER NERUOLGIST NOTE (DR HICKLING)  . Generalized convulsive epilepsy ([place]) NEUROLOGIST-  DR HICKLING  . GERD (gastroesophageal reflux disease)   . Gout   . H/O sinus tachycardia   . History of acute respiratory failure 02/14/2015   SECONDARY TO CAP  . History of recurrent UTIs   . Interstitial cystitis   . Moderate intellectual disability   . OSA (obstructive sleep apnea)    no machine yet   . Partial epilepsy with impairment of consciousness ([place])    FOLLOWED BY DR HICKLING  . Pneumonia 2    4 times this year ( 2020)  . PONV (postoperative nausea and vomiting)   . Seizure ([place])    most recent sz " at the end of summer"-last sz years ago per mother  . Urinary incontinence      Past Surgical History:  Procedure Laterality Date  . CYSTOSCOPY N/A 06/03/2017   Procedure: CYSTOSCOPY WITH EXAM UNDER ANESTHESIA;  Surgeon: Festus Aloe, MD;  Location: [facility];  Service: Urology;  Laterality: N/A;  . CYSTOSCOPY  2020  . CYSTOSCOPY WITH HYDRODISTENSION AND BIOPSY  04/14/2012   Procedure: CYSTOSCOPY/BIOPSY/HYDRODISTENSION;  Surgeon: Hanley Ben, MD;  Location: [facility];  Service: Urology;;  instillation of marcaine and pyridium  . EUA/  PAP SMEAR/  BREAST EXAM  03-12-2016   dr Ihor Dow [facility]  . EUA/ VAGINOSCOPY/ COLPOSCOPY FO THE HYMENAL RING  10-04-2009    dr Delsa Sale  [facility]  . IR RADIOLOGIST EVAL & MGMT  07/29/2019  . TONSILLECTOMY      There were no vitals filed for this visit.   Subjective Assessment - 08/31/19 1944    Subjective Pt has obtained new shoes from [employer] - mother states pt does not have the orthotics in the new shoes - states the salesperson told her that her current orthotics are too small for her    Patient is accompained by: Family member   mother, [name]   Pertinent History Autism;  Developmental Delay; Generalized convulsive epilepsy; seizures; Recurrent falls  Patient Stated Goals improve strength; improve balance; try to improve breathing function (I informed pt's mother that we do not specifically address this problem at [facility] but cardiovascular exercise would be included with exercises)    Currently in Pain? No/denies    Pain Onset More than a month ago                             [facility] Adult PT Treatment/Exercise - 08/31/19 0001      Transfers   Transfers Sit to Stand    Number of Reps Other reps (comment)   5   Comments feet on foam pad - no UE support       Ambulation/Gait   Ambulation/Gait Yes    Ambulation/Gait Assistance 5: Supervision    Ambulation Distance (Feet) 350 Feet    Assistive device None    Gait Pattern Within Functional Limits     Ambulation Surface Level;Indoor    Stairs Yes    Stairs Assistance 5: Supervision    Stair Management Technique Two rails;Forwards;Alternating pattern;Step to pattern   alternating pattern ascending; step to descending    Number of Stairs 4    Height of Stairs 6      Knee/Hip Exercises: Aerobic   Recumbent Bike SciFit level 2.0 x 5" with 3 short rest breaks - w/UE's & LE's      Knee/Hip Exercises: Machines for Strengthening   Cybex Leg Press 50# bil. LE's 3 sets 10 reps      Knee/Hip Exercises: Standing   Hip Flexion Stengthening;Right;Left;1 set;10 reps;Knee straight   2# weight on each leg    Hip Abduction Stengthening;Right;Left;1 set;10 reps   2# weight on each leg   Hip Extension Stengthening;Right;Left;1 set;10 reps   2# weight on each leg         NeuroRe-ed:  Pt performed cone taps to 4 cones with min HHA - to fascilitate improved SLS on each leg  Touching 2 balance bubbles - straight ahead 2 reps each foot and then 2 diagonals with each foot with min HHA for improved SLS on each leg               PT Long Term Goals - 08/31/19 2005      PT LONG TERM GOAL #1   Title Pt. will amb. 500' on flat, even surface without LOB.      Baseline 350'without use of device prior to pt needing seated rest break    Time 6    Period Weeks    Status On-going      PT LONG TERM GOAL #2   Title Pt will perform at least 10" on recumbent bike nonstop for improved endurance/activity tolerance.    Baseline 5" with 1 short rest break on 07-20-19    Time 6    Period Weeks    Status On-going      PT LONG TERM GOAL #3   Title Mother will verbalize understanding of HEP to assist with LE strengthening.      Time 6    Period Weeks    Status Achieved      PT LONG TERM GOAL #4   Title Perform sit to stand 5 reps without UE support from mat to demo increased bil. LE strength.    Baseline met 07-20-19    Time 6    Period Weeks    Status Achieved  Plan - 08/31/19  1959    Clinical Impression Statement Pt's gait improved with use of new [place] shoes - they help control supination of Lt foot in swing, however, pt's LLE continues to be slightly internally rotated in swing and early stance phase of gait    PT Frequency 1x / week    PT Duration 4 weeks    PT Treatment/Interventions ADLs/Self Care Home Management;Balance training;Gait training;Neuromuscular re-education;Stair training;Patient/family education;Therapeutic activities;Therapeutic exercise    PT Next Visit Plan gait train, balance exercises, SciFit    Consulted and Agree with Plan of Care Patient;Family member/caregiver    Family Member Consulted mother [name]           Patient will benefit from skilled therapeutic intervention in order to improve the following deficits and impairments:     Visit Diagnosis: Other abnormalities of gait and mobility  Muscle weakness (generalized)  Unsteadiness on feet     Problem List Patient Active Problem List   Diagnosis Date Noted  . Complex care coordination 05/18/2019  . Screening for cervical cancer 02/26/2019  . Glucosuria 06/01/2018  . Steroid-induced diabetes ([place]) 06/01/2018  . Not well controlled moderate persistent asthma 04/29/2018  . Gastroesophageal reflux disease 04/29/2018  . Recurrent infections 04/29/2018  . Dysuria 02/20/2018  . Chronic rhinitis 04/03/2017  . Recurrent falls 03/21/2017  . Weakness 03/21/2017  . Venous insufficiency 03/21/2017  . OSA on CPAP 09/30/2016  . Excessive daytime sleepiness 03/05/2016  . Bronchitis 05/05/2015  . 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 Cough variant asthma vs UACS/ vcd  03/15/2015  . Autism spectrum disorder 02/14/2015  . Nausea with vomiting 02/14/2015  . Obesity, morbid ([place]) 12/13/2014  . 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 Acanthosis nigricans 11/14/2014  . Toeing-in  08/29/2014  . Generalized convulsive epilepsy ([place]) 09/28/2012  . Partial epilepsy with impairment of consciousness ([place]) 09/28/2012  . 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 Recurrent urinary tract infection 08/01/2011  . Asthma 08/01/2011  . Chronic constipation 08/01/2011  . Contraception 08/01/2011    Alda Lea, [place] 08/31/2019, 8:42 PM  [facility] [address] [place], [place], [address] Phone: [phone]   Fax:  [phone]  Name: Lori Miles MRN: [id number] Date of Birth: Dec 20, 1995

## 2019-09-01 ENCOUNTER — Telehealth (HOSPITAL_COMMUNITY): Payer: Self-pay | Admitting: Radiology

## 2019-09-01 NOTE — Telephone Encounter (Signed)
Called pt's mother to schedule sclerotherapy treatment with Dr. Loreta Ave. She is in a meeting and will call me back to schedule. JM

## 2019-09-02 NOTE — Progress Notes (Addendum)
Anesthesia PAT Evaluation:   Case: [id number] Date/Time: 09/08/19 0730   Procedure: IR Sclerotherapy Treatment (N/A )   Anesthesia type: General   Pre-op diagnosis: chronic UTI N39.0 and cystitis N30.90   Location: [facility]   Surgeons: Radiologist, Medication, MD      DISCUSSION: Patient is a 24 year old female scheduled for the above procedure. According to IR evaluation on 07/29/19 by Corrie Mckusick, DO, patient seen for potential repeat image guided aspiration and possible sclerosis of recurrent right renal cyst. She underwent previous aspiration with image guidance of a 5.7 cm x 4.5 cm right renal cyst on 04/01/14. Mother felt behaviors improved following aspiration and now is concerned that the cyst might be causing alteration in her toileting habits again. Mother believes general anesthesia is needed to keep Lori Miles "safe and comfortable" for planned procedure.   Her mother is Lori Miles, but she goes by Lori Miles".  History includes never smoker, post-operative N/V, seizures, cognitive developmental delay, disruptive behavior disorder, autism spectrum disorder, gait disorder, interstitial cystitis (with frequent UTIs), GERD, asthma, chronic constipation, sinus tachycardia, dyspnea, OSA (has CPAP, but not currently using because it is in storage while living with relatives), recurrent pneumonia, gait disorder.  Family member with hives after surgery/anesthesia.   Previous procedures include:  - Cystourethroscopy, urethral calibration, Hydrodistention of the bladder, pelvic floor trigger point injection of bupivacaine and triamcinolone for myalgia, bladder installation with phenazopyridine and bupivacaine, bilateral retrograde pyelograms ([facility], [facility]) - PAP Smear under anesthesia 03/03/19 ([facility], [facility]) - Cystoscopy with Botox bladder injection 10/23/18 ([facility], [facility]) - Cystoscopy (MAC) 06/03/17 - She had vaginal exam/PAPD under MAC anesthesia 10/04/09 and 03/12/16  - Meatal  dilation, Cystoscopy, bladder biopsy 04/14/12 ([facility])   Lori Miles has a history of recurrent infections. Mother believes Lori Miles has recurrent UTIs that develop into respiratory infections. Notes suggest that mother is often insistent on treatment with antibiotics, so she was referred to ID Carlyle Basques, MD who discussed concerns about risks of MDRO, and suggested not treating if CFU < 100,000 and offered a second opinion.  Last neurology visit 06/11/19 with Rockwell Germany, NP. Mother reported more frequent seizures ("nearly every day"; described as nodding head up, eyes rolled back, tongue twisted, then not responsive to get her attention with episodes last a few-20 minutes).  She had a normal awake EEG on 06/14/2019. NP unclear if events are actually seizures, but asked mother to video events. Future 48 Hour ambulatory EEG planned once after planned family move.  Known history of breakthrough seizures during times of infection. - They are still living with family while trying to purchase a new home, so ambulatory EEG has not been scheduled; however, on 09/06/19, mother reports that Lori Miles's seizures are currently controlled and that acute UTIs are when Lori Miles may have a breakthrough seizure. (< 10,000 col/ml 09/01/19 urine culture at [facility], "No further workup" per urology.)  She takes prednisone intermittently with asthma exacerbations, but reports no recent issues with asthma. Mother has been trying to discourage steroids whenever possible due to weight gain. She did finish Levaquin 1-2 weeks ago because mother says when Lori Miles gets a UTI it will always lead to a respiratory infection. (Of note, following her PAT visit, mother said she took [place] to [facility] to see Lori Mariscal, MD because she though Lori Miles may have a residual sinus infection and says Lori Miles was given a steroid injection and Rocephin injection.)    Mother's primary concerns: - Will she be admitted  for observation? I  told her that if there is a post-anesthesia issue such as concerns about vitals or uncontrolled pain or N/V then anesthesiologist may make a recommendation for overnight stay, otherwise decision for admission post-procedure is typically up to surgeon. (I forwarded this to Dr. Earleen Newport, but mother says she has also discussed with Dr. Earleen Newport.)  - Does she need a COVID-19 test, and if so, how will it be done? She has not been vaccinated. I spoke with anesthesiologist Dr. Oren Bracket.: She will need a test pre-op. She can't get it done on 09/07/19 due to scheduling conflict/transportation issues--also mother says she does not think Lori Miles will cooperate with test because last test caused a nose bleed. Mother says Jenya will need sedation--could consider sedation with oral Versed or other sedation per assigned anesthesiologist to do the test.  Mother willing to to try this, although she still questioned about COVID testing once under general anesthesia. I discussed reasons why we would like testing results prior to her procedure. I also sent a staff message with update to Dr Earleen Newport about this.    - Also mother says Latarra should be able to take morning medications with a few sips of water, but otherwise says she does better with medications in mashed bananas or in applesauce. I reiterated that morning day of surgery medications should be taken with sips of water and no food.   Anesthesia team to evaluate on the day of procedure.    UPDATE 09/08/19 7:33 AM: From Dr. Earleen Newport, regarding anticipated disposition post-procedure: "Our planned procedure would typically be 1 hr recovery for moderate sedation. If no problems with GETA, I would then assume we can DC safely home from PACU."     VS: BP 107/83   Pulse 80   Temp 37 C (Oral)   Resp 18   Ht 4' 9.5" (1.461 m)   Wt 84.1 kg   SpO2 99%   BMI 39.40 kg/m  Lori Miles examined in a wheelchair. Mother at side. Lori Miles did answer "yes" to a question,  although mother says her answers are inconsistent. Lori Miles did not take deep breaths on request, so lung sounds in the bases were diminished but overall I did not hear rhonchi or wheezes. Heart RRR, no murmur noted. No acute distress.    PROVIDERS: Scifres, Earlie Server, PA-C is PCP  - Wyline Copas, MD is neurologist. Primarily sees Rockwell Germany, NP, Last evaluation on 06/11/19. Alona Bene, MD is urologist [facility] Previously saw pediatric urologist Hulen Luster, MD. - Chesley Mires, MD is pulmonologist. Last visit 05/18/19 with Wyn Quaker, NP. By notes, patient has had multiple sleep studies showing no significant sleep apnea and that 09/22/18 study showed "total sleep time at 367 minutes. There was no significant sleep apnea with a AHI at 0.7. PLM I was 0. She had a M SLT done on September 23, 2018 with sleep onset and 5 of 5 naps with mean sleep latency at 29 seconds. 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 Mother was offered a second opinion.  - Allena Katz, MD is Allergiest - Zenovia Jarred, MD is GI - Carlyle Basques, MD is ID. Last evaluation 05/03/19. Notes indicate mother is "fixated on the concept of her daughter having combined sinusitis-urinary tract infection concurrent infections". Despite discussion risks of MDRO, mother still feels daughter needs to be on an antibiotic continuously.  Judicious use of antibiotics recommended and consider not treating if CFU < 100,000. 2nd opinion offered. - She is not followed by cardiology regularly, but saw EP cardiologist Curt Bears, Will, MD on 05/14/15 for tachycardia and had echo and Holter monitor (see below).   LABS: Preprocedure labs WNL. She is on   (all labs ordered are listed, but only abnormal results are displayed)  Labs Reviewed  GLUCOSE, CAPILLARY  CBC  BASIC METABOLIC PANEL      OTHER: EEG 06/14/19: Impression: This EEG is normal during awake state. Please note that normal EEG does not exclude epilepsy, clinical correlation is indicated.  If there is any clinical concern for seizure activity, a prolonged ambulatory EEG is recommended. - Also normal EEG on 05/21/18, 03/25/17, 02/20/12   IMAGES: CXR 05/17/19: FINDINGS: Very low lung volumes. Patchy bibasilar opacities, atelectasis versus infiltrates. No effusions or pneumothorax. Heart is normal size. No acute bony abnormality. IMPRESSION: Low lung volumes with patchy bibasilar atelectasis or infiltrates.   EKG: 05/21/17: Sinus rhythm Borderline T abnormalities, anterior leads Baseline wander in lead(s) II III aVF V3 Otherwise no significant change Confirmed by Addison Lank [phone]) on 05/21/2017 8:45:11 AM   CV: 24-48 Hour Holter Monitor 07/07/15: Minimum HR: 76 Maximum HR: 160 Mean HR: 100 Sinus rhythm with episodes of sinus tachycardia and sinus arrhythmia No evidence of SVT or significant bradycardia   Echo 05/29/15: Study Conclusions - Left ventricle: The cavity size was normal. Systolic function was normal. The estimated ejection fraction was in the range of 55% to 60%. Wall motion was normal; there were no regional wall motion abnormalities. Left ventricular diastolic function parameters were normal. - Tricuspid valve: There was trivial regurgitation.    Past Medical History:  Diagnosis Date  . Asthma   . Autism    mom states no actual dx, but admits she has some symptoms  . Bacteriuria   . Chronic constipation   . Cognitive developmental delay   . Constipation   . Disruptive behavior disorder   . Dyspnea    if she walks much  . Family history of adverse reaction to anesthesia     "hives"-? if anesthesia did take antibiotic  . Frequency-urgency syndrome   . Gait disorder    ORGANIC PER NERUOLGIST NOTE (DR HICKLING)  . Generalized convulsive epilepsy ([place]) NEUROLOGIST-   DR HICKLING  . GERD (gastroesophageal reflux disease)   . Gout   . H/O sinus tachycardia   . History of acute respiratory failure 02/14/2015   SECONDARY TO CAP  . History of recurrent UTIs   . Interstitial cystitis   . Moderate intellectual disability   . OSA (obstructive sleep apnea)    no machine yet   . Partial epilepsy with impairment of consciousness ([place])    FOLLOWED BY DR HICKLING  . Pneumonia 2    4 times this year ( 2020)  . PONV (postoperative nausea and vomiting)   . Seizure ([place])    most recent sz " at the end of summer"-last sz years ago per mother  . Urinary incontinence     Past Surgical History:  Procedure Laterality Date  . CYSTOSCOPY N/A 06/03/2017   Procedure: CYSTOSCOPY WITH EXAM UNDER ANESTHESIA;  Surgeon: Festus Aloe, MD;  Location: [facility];  Service: Urology;  Laterality: N/A;  . CYSTOSCOPY  2020  . CYSTOSCOPY WITH HYDRODISTENSION AND BIOPSY  04/14/2012   Procedure: CYSTOSCOPY/BIOPSY/HYDRODISTENSION;  Surgeon: Hanley Ben, MD;  Location: [facility];  Service: Urology;;  instillation  of marcaine and pyridium  . EUA/  PAP SMEAR/  BREAST EXAM  03-12-2016   dr Ihor [facility]  . EUA/ VAGINOSCOPY/ COLPOSCOPY FO THE HYMENAL RING  10-04-2009    dr Delsa Sale  [facility]  . IR RADIOLOGIST EVAL & MGMT  07/29/2019  . TONSILLECTOMY      MEDICATIONS: . acetaminophen (TYLENOL) 325 MG tablet  . albuterol (PROVENTIL) (2.5 MG/3ML) 0.083% nebulizer solution  . amoxicillin (AMOXIL) 500 MG tablet  . benzonatate (TESSALON) 200 MG capsule  . budesonide (PULMICORT) 1 MG/2ML nebulizer solution  . cephALEXin (KEFLEX) 500 MG capsule  . cetirizine (ZYRTEC) 10 MG tablet  . clotrimazole-betamethasone (LOTRISONE) cream  . DEPAKOTE 500 MG DR tablet  . famotidine (PEPCID) 20 MG tablet  . fluconazole (DIFLUCAN) 150 MG tablet  . fluticasone (FLONASE) 50 MCG/ACT nasal spray  . fosfomycin (MONUROL) 3 g PACK  . hydrOXYzine (ATARAX/VISTARIL)  25 MG tablet  . Levonorgestrel-Ethinyl Estradiol (SEASONIQUE) 0.15-0.03 &0.01 MG tablet  . Melatonin 3 MG TABS  . mometasone (NASONEX) 50 MCG/ACT nasal spray  . montelukast (SINGULAIR) 10 MG tablet  . Nebulizer MISC  . ondansetron (ZOFRAN ODT) 4 MG disintegrating tablet  . PERFOROMIST 20 MCG/2ML nebulizer solution  . polyethylene glycol powder (GLYCOLAX/MIRALAX) 17 GM/SCOOP powder  . predniSONE (DELTASONE) 10 MG tablet  . promethazine (PHENERGAN) 25 MG suppository  . promethazine (PHENERGAN) 25 MG tablet  . senna (SENOKOT) 8.6 MG tablet  . terconazole (TERAZOL 7) 0.4 % vaginal cream  . traZODone (DESYREL) 50 MG tablet  . YUPELRI 175 MCG/3ML SOLN   No current facility-administered medications for this encounter.    Myra Gianotti, PA-C Surgical Short Stay/Anesthesiology [facility] Phone [phone] [facility] Phone [phone] 09/06/2019 7:03 PM

## 2019-09-06 ENCOUNTER — Other Ambulatory Visit: Payer: Self-pay

## 2019-09-06 ENCOUNTER — Encounter (HOSPITAL_COMMUNITY): Payer: Self-pay

## 2019-09-06 ENCOUNTER — Encounter (HOSPITAL_COMMUNITY)
Admission: RE | Admit: 2019-09-06 | Discharge: 2019-09-06 | Disposition: A | Discharge status: Home / Self Care | Payer: Medicare Other | Source: Ambulatory Visit | Attending: Vascular Surgery | Admitting: Vascular Surgery

## 2019-09-06 DIAGNOSIS — Z7951 Long term (current) use of inhaled steroids: Secondary | ICD-10-CM | POA: Insufficient documentation

## 2019-09-06 DIAGNOSIS — M109 Gout, unspecified: Secondary | ICD-10-CM | POA: Insufficient documentation

## 2019-09-06 DIAGNOSIS — R32 Unspecified urinary incontinence: Secondary | ICD-10-CM | POA: Diagnosis not present

## 2019-09-06 DIAGNOSIS — R6259 Other lack of expected normal physiological development in childhood: Secondary | ICD-10-CM | POA: Insufficient documentation

## 2019-09-06 DIAGNOSIS — Z7952 Long term (current) use of systemic steroids: Secondary | ICD-10-CM | POA: Diagnosis not present

## 2019-09-06 DIAGNOSIS — Z79899 Other long term (current) drug therapy: Secondary | ICD-10-CM | POA: Diagnosis not present

## 2019-09-06 DIAGNOSIS — K5909 Other constipation: Secondary | ICD-10-CM | POA: Insufficient documentation

## 2019-09-06 DIAGNOSIS — Z8701 Personal history of pneumonia (recurrent): Secondary | ICD-10-CM | POA: Diagnosis not present

## 2019-09-06 DIAGNOSIS — K219 Gastro-esophageal reflux disease without esophagitis: Secondary | ICD-10-CM | POA: Insufficient documentation

## 2019-09-06 DIAGNOSIS — Z87440 Personal history of urinary (tract) infections: Secondary | ICD-10-CM | POA: Diagnosis not present

## 2019-09-06 DIAGNOSIS — Z793 Long term (current) use of hormonal contraceptives: Secondary | ICD-10-CM | POA: Insufficient documentation

## 2019-09-06 DIAGNOSIS — N3090 Cystitis, unspecified without hematuria: Secondary | ICD-10-CM | POA: Insufficient documentation

## 2019-09-06 DIAGNOSIS — J45909 Unspecified asthma, uncomplicated: Secondary | ICD-10-CM | POA: Diagnosis not present

## 2019-09-06 DIAGNOSIS — Z01812 Encounter for preprocedural laboratory examination: Secondary | ICD-10-CM | POA: Insufficient documentation

## 2019-09-06 DIAGNOSIS — G4733 Obstructive sleep apnea (adult) (pediatric): Secondary | ICD-10-CM | POA: Insufficient documentation

## 2019-09-06 HISTORY — DX: Full incontinence of feces: R15.9

## 2019-09-06 LAB — BASIC METABOLIC PANEL
Anion gap: 10 (ref 5–15)
BUN: 15 mg/dL (ref 6–20)
CO2: 22 mmol/L (ref 22–32)
Calcium: 9.5 mg/dL (ref 8.9–10.3)
Chloride: 105 mmol/L (ref 98–111)
Creatinine, Ser: 0.96 mg/dL (ref 0.44–1.00)
GFR calc Af Amer: >60 mL/min (ref >60)
GFR calc non Af Amer: >60 mL/min (ref >60)
Glucose, Bld: 91 mg/dL (ref 70–99)
Potassium: 4.8 mmol/L (ref 3.5–5.1)
Sodium: 137 mmol/L (ref 135–145)

## 2019-09-06 LAB — CBC
HCT: 38.5 % (ref 36.0–46.0)
Hemoglobin: 12.7 g/dL (ref 12.0–15.0)
MCH: 30.4 pg (ref 26.0–34.0)
MCHC: 33 g/dL (ref 30.0–36.0)
MCV: 92.1 fL (ref 80.0–100.0)
Platelets: 236 10*3/uL (ref 150–400)
RBC: 4.18 MIL/uL (ref 3.87–5.11)
RDW: 13.1 % (ref 11.5–15.5)
WBC: 10.2 10*3/uL (ref 4.0–10.5)
nRBC: 0 % (ref 0.0–0.2)

## 2019-09-06 LAB — GLUCOSE, CAPILLARY: Glucose-Capillary: 86 mg/dL (ref 70–99)

## 2019-09-06 NOTE — Anesthesia Preprocedure Evaluation (Addendum)
Anesthesia Evaluation  Patient identified by MRN, date of birth, ID band Patient awake    Reviewed: Allergy & Precautions, H&P , NPO status   History of Anesthesia Complications (+) PONV and history of anesthetic complications  Airway Mallampati: II   Neck ROM: full    Dental   Pulmonary shortness of breath, asthma , sleep apnea ,    breath sounds clear to auscultation       Cardiovascular negative cardio ROS   Rhythm:regular Rate:Normal     Neuro/Psych Seizures -,  autism    GI/Hepatic GERD  ,  Endo/Other  diabetes, Type 2  Renal/GU      Musculoskeletal   Abdominal   Peds  Hematology   Anesthesia Other Findings   Reproductive/Obstetrics                             Anesthesia Physical Anesthesia Plan  ASA: II  Anesthesia Plan: General   Post-op Pain Management:    Induction: Intravenous  PONV Risk Score and Plan: 4 or greater and Ondansetron, Dexamethasone, Midazolam, Scopolamine patch - Pre-op and Treatment may vary due to age or medical condition  Airway Management Planned: Oral ETT  Additional Equipment:   Intra-op Plan:   Post-operative Plan: Extubation in OR  Informed Consent: I have reviewed the patients History and Physical, chart, labs and discussed the procedure including the risks, benefits and alternatives for the proposed anesthesia with the patient or authorized representative who has indicated his/her understanding and acceptance.       Plan Discussed with: CRNA, Anesthesiologist and Surgeon  Anesthesia Plan Comments: (See PAT note written 09/06/2019 by Shonna Chock, PA-C. Mother thinks Brigitte will require sedation for COVID testing.  )       Anesthesia Quick Evaluation

## 2019-09-06 NOTE — Progress Notes (Signed)
Lori Miles shows no sign of pain, patients's mother with patient, Lori Miles who reports that she grabs things when she is hurting and cursing. Lori Miles answered that she is at the hospital, some speech disorder, but hospital was clear. Lori Miles also asked for a sprite clearly.  Mrs. Stonehocker states that patient will have to be asleep before Covid test is done. Shonna Chock spoke with Cyndia Diver regarding swab. Revonda Standard also  reviewed chart and saw patient.

## 2019-09-06 NOTE — Pre-Procedure Instructions (Addendum)
Lori Miles  09/06/2019    Your procedure is scheduled on Wednesday, June 23..  Report to [facility], Main Entrance or Entrance "A" at 5:45 AM             Your surgery or procedure is scheduled for  7:45  A.M.   Call this number if you have problems the morning of surgery: [phone]   Remember:  Do not eat or drink after midnight.  Take these medicines the morning of surgery with A SIP OF WATER:  DEPAKOTE             famotidine (PEPCID)             Levonorgestrel-Ethinyl Estradiol (SEASONIQUE)              PERFOROMIST nebulizer             budesonide (PULMICORT)         If needed:  acetaminophen (TYLENOL)  albuterol (PROVENTIL)  Nebulizer  Special instructions:   [place]- Preparing For Surgery  Before surgery, you can play an important role. Because skin is not sterile, your skin needs to be as free of germs as possible. You can reduce the number of germs on your skin by washing with CHG (chlorahexidine gluconate) Soap before surgery.  CHG is an antiseptic cleaner which kills germs and bonds with the skin to continue killing germs even after washing.    Oral Hygiene is also important to reduce your risk of infection.  Remember - BRUSH YOUR TEETH THE MORNING OF SURGERY WITH YOUR REGULAR TOOTHPASTE  Please do not use if you have an allergy to CHG or antibacterial soaps. If your skin becomes reddened/irritated stop using the CHG.  Do not shave (including legs and underarms) for at least 48 hours prior to first CHG shower. It is OK to shave your face.  Please follow these instructions carefully.   1. Shower the NIGHT BEFORE SURGERY and the MORNING OF SURGERY with CHG.   2. If you chose to wash your hair, wash your hair first as usual with your normal shampoo.  3. After you shampoo, wash your face and private area with the soap you use at home, then rinse your hair and body thoroughly to remove the shampoo and soap.                4. Use CHG as you  would any other liquid soap. You can apply CHG directly to the skin and wash gently with a scrungie or a clean washcloth.   5. Apply the CHG Soap to your body ONLY FROM THE NECK DOWN.  Do not use on open wounds or open sores. Avoid contact with your eyes, ears, mouth and genitals (private parts).  6. Wash thoroughly, paying special attention to the area where your surgery will be performed.  7. Thoroughly rinse your body with warm water from the neck down.  8. DO NOT shower/wash with your normal soap after using and rinsing off the CHG Soap.  9. Pat yourself dry with a CLEAN TOWEL.  10. Wear CLEAN PAJAMAS to bed the night before surgery, wear comfortable clothes the morning of surgery  11. Place CLEAN SHEETS on your bed the night of your first shower and DO NOT SLEEP WITH PETS.  Day of Surgery: Shower as instructed above. Do not wear lotions, powders, or perfumes, or deodorant.  Please wear clean clothes to the hospital/surgery center.   Remember to brush  your teeth WITH YOUR REGULAR TOOTHPASTE.  Do not wear jewelry, make-up or nail polish.  Do not shave 48 hours prior to surgery.    Do not bring valuables to the hospital.  [facility] is not responsible for any belongings or valuables.  Contacts, dentures or bridgework may not be worn into surgery.  Leave your suitcase in the car.  After surgery it may be brought to your room.  For patients admitted to the hospital, discharge time will be determined by your treatment team.  Patients discharged the day of surgery will not be allowed to drive home.   Please read over the following fact sheets that you were given.

## 2019-09-07 ENCOUNTER — Other Ambulatory Visit (HOSPITAL_COMMUNITY): Payer: Medicare Other

## 2019-09-07 ENCOUNTER — Other Ambulatory Visit: Payer: Self-pay | Admitting: Radiology

## 2019-09-07 ENCOUNTER — Ambulatory Visit: Payer: Medicare Other | Admitting: Physical Therapy

## 2019-09-07 DIAGNOSIS — M6281 Muscle weakness (generalized): Secondary | ICD-10-CM

## 2019-09-07 DIAGNOSIS — R2689 Other abnormalities of gait and mobility: Secondary | ICD-10-CM

## 2019-09-07 DIAGNOSIS — R2681 Unsteadiness on feet: Secondary | ICD-10-CM

## 2019-09-08 ENCOUNTER — Inpatient Hospital Stay (HOSPITAL_COMMUNITY): Payer: Medicare Other | Admitting: Vascular Surgery

## 2019-09-08 ENCOUNTER — Ambulatory Visit (HOSPITAL_COMMUNITY)
Admission: RE | Admit: 2019-09-08 | Discharge: 2019-09-08 | Disposition: A | Discharge status: Home / Self Care | Payer: Medicare Other | Attending: Interventional Radiology | Admitting: Interventional Radiology

## 2019-09-08 ENCOUNTER — Other Ambulatory Visit: Payer: Self-pay

## 2019-09-08 ENCOUNTER — Encounter (HOSPITAL_COMMUNITY)
Admission: RE | Disposition: A | Discharge status: Home / Self Care | Payer: Self-pay | Source: Home / Self Care | Attending: Interventional Radiology

## 2019-09-08 ENCOUNTER — Inpatient Hospital Stay (HOSPITAL_COMMUNITY): Payer: Medicare Other | Admitting: Anesthesiology

## 2019-09-08 ENCOUNTER — Ambulatory Visit (HOSPITAL_COMMUNITY)
Admission: RE | Admit: 2019-09-08 | Discharge: 2019-09-08 | Disposition: A | Discharge status: Home / Self Care | Payer: Medicare Other | Source: Ambulatory Visit | Attending: Interventional Radiology | Admitting: Interventional Radiology

## 2019-09-08 ENCOUNTER — Encounter: Payer: Self-pay | Admitting: Physical Therapy

## 2019-09-08 ENCOUNTER — Encounter (HOSPITAL_COMMUNITY): Payer: Self-pay | Admitting: Interventional Radiology

## 2019-09-08 DIAGNOSIS — G40909 Epilepsy, unspecified, not intractable, without status epilepticus: Secondary | ICD-10-CM | POA: Insufficient documentation

## 2019-09-08 DIAGNOSIS — N3090 Cystitis, unspecified without hematuria: Secondary | ICD-10-CM | POA: Diagnosis not present

## 2019-09-08 DIAGNOSIS — Z20822 Contact with and (suspected) exposure to covid-19: Secondary | ICD-10-CM | POA: Diagnosis not present

## 2019-09-08 DIAGNOSIS — J45909 Unspecified asthma, uncomplicated: Secondary | ICD-10-CM | POA: Insufficient documentation

## 2019-09-08 DIAGNOSIS — J4540 Moderate persistent asthma, uncomplicated: Secondary | ICD-10-CM | POA: Diagnosis not present

## 2019-09-08 DIAGNOSIS — F71 Moderate intellectual disabilities: Secondary | ICD-10-CM | POA: Insufficient documentation

## 2019-09-08 DIAGNOSIS — Z7952 Long term (current) use of systemic steroids: Secondary | ICD-10-CM | POA: Insufficient documentation

## 2019-09-08 DIAGNOSIS — F840 Autistic disorder: Secondary | ICD-10-CM | POA: Insufficient documentation

## 2019-09-08 DIAGNOSIS — N281 Cyst of kidney, acquired: Secondary | ICD-10-CM | POA: Diagnosis present

## 2019-09-08 DIAGNOSIS — E119 Type 2 diabetes mellitus without complications: Secondary | ICD-10-CM | POA: Insufficient documentation

## 2019-09-08 DIAGNOSIS — Z7951 Long term (current) use of inhaled steroids: Secondary | ICD-10-CM | POA: Diagnosis not present

## 2019-09-08 DIAGNOSIS — Z79899 Other long term (current) drug therapy: Secondary | ICD-10-CM | POA: Diagnosis not present

## 2019-09-08 DIAGNOSIS — F918 Other conduct disorders: Secondary | ICD-10-CM | POA: Insufficient documentation

## 2019-09-08 DIAGNOSIS — N390 Urinary tract infection, site not specified: Secondary | ICD-10-CM

## 2019-09-08 DIAGNOSIS — G4733 Obstructive sleep apnea (adult) (pediatric): Secondary | ICD-10-CM | POA: Diagnosis not present

## 2019-09-08 DIAGNOSIS — Z6839 Body mass index (BMI) 39.0-39.9, adult: Secondary | ICD-10-CM | POA: Diagnosis not present

## 2019-09-08 DIAGNOSIS — K219 Gastro-esophageal reflux disease without esophagitis: Secondary | ICD-10-CM | POA: Diagnosis not present

## 2019-09-08 DIAGNOSIS — M109 Gout, unspecified: Secondary | ICD-10-CM | POA: Diagnosis not present

## 2019-09-08 DIAGNOSIS — Z87440 Personal history of urinary (tract) infections: Secondary | ICD-10-CM | POA: Insufficient documentation

## 2019-09-08 DIAGNOSIS — L83 Acanthosis nigricans: Secondary | ICD-10-CM | POA: Insufficient documentation

## 2019-09-08 DIAGNOSIS — Z793 Long term (current) use of hormonal contraceptives: Secondary | ICD-10-CM | POA: Diagnosis not present

## 2019-09-08 HISTORY — PX: IR SCLEROTHERAPY OF A FLUID COLLECTION: IMG6090

## 2019-09-08 HISTORY — PX: RADIOLOGY WITH ANESTHESIA: SHX6223

## 2019-09-08 LAB — GLUCOSE, CAPILLARY: Glucose-Capillary: 111 mg/dL — ABNORMAL HIGH (ref 70–99)

## 2019-09-08 LAB — CBC
HCT: 40.5 % (ref 36.0–46.0)
Hemoglobin: 13.2 g/dL (ref 12.0–15.0)
MCH: 30.2 pg (ref 26.0–34.0)
MCHC: 32.6 g/dL (ref 30.0–36.0)
MCV: 92.7 fL (ref 80.0–100.0)
Platelets: 229 10*3/uL (ref 150–400)
RBC: 4.37 MIL/uL (ref 3.87–5.11)
RDW: 13.4 % (ref 11.5–15.5)
WBC: 17.4 10*3/uL — ABNORMAL HIGH (ref 4.0–10.5)
nRBC: 0 % (ref 0.0–0.2)

## 2019-09-08 LAB — SARS CORONAVIRUS 2 BY RT PCR (HOSPITAL ORDER, PERFORMED IN ~~LOC~~ HOSPITAL LAB): SARS Coronavirus 2: NEGATIVE

## 2019-09-08 LAB — PROTIME-INR
INR: 1.1 (ref 0.8–1.2)
Prothrombin Time: 13.8 seconds (ref 11.4–15.2)

## 2019-09-08 LAB — POCT PREGNANCY, URINE: Preg Test, Ur: NEGATIVE

## 2019-09-08 SURGERY — IR WITH ANESTHESIA
Anesthesia: General

## 2019-09-08 MED ORDER — CHLORHEXIDINE GLUCONATE 0.12 % MT SOLN
15.0000 mL | Freq: Once | OROMUCOSAL | Status: AC
  Administered 2019-09-08: 15 mL via OROMUCOSAL
  Filled 2019-09-08: qty 15

## 2019-09-08 MED ORDER — IOHEXOL 300 MG/ML  SOLN
100.0000 mL | Freq: Once | INTRAMUSCULAR | Status: AC | PRN
  Administered 2019-09-08: 10 mL

## 2019-09-08 MED ORDER — FENTANYL CITRATE (PF) 250 MCG/5ML IJ SOLN
INTRAMUSCULAR | Status: AC
  Filled 2019-09-08: qty 5

## 2019-09-08 MED ORDER — ONDANSETRON HCL 4 MG/2ML IJ SOLN
INTRAMUSCULAR | Status: DC | PRN
  Administered 2019-09-08: 4 mg via INTRAVENOUS

## 2019-09-08 MED ORDER — LIDOCAINE HCL 1 % IJ SOLN
INTRAMUSCULAR | Status: AC
  Filled 2019-09-08: qty 20

## 2019-09-08 MED ORDER — DEXMEDETOMIDINE HCL IN NACL 200 MCG/50ML IV SOLN
INTRAVENOUS | Status: AC
  Filled 2019-09-08: qty 100

## 2019-09-08 MED ORDER — LIDOCAINE 2% (20 MG/ML) 5 ML SYRINGE
INTRAMUSCULAR | Status: DC | PRN
  Administered 2019-09-08: 80 mg via INTRAVENOUS

## 2019-09-08 MED ORDER — ALCOHOL 98 % IJ SOLN
INTRAMUSCULAR | Status: AC
  Filled 2019-09-08: qty 10

## 2019-09-08 MED ORDER — DEXAMETHASONE SODIUM PHOSPHATE 10 MG/ML IJ SOLN
INTRAMUSCULAR | Status: DC | PRN
  Administered 2019-09-08: 10 mg via INTRAVENOUS

## 2019-09-08 MED ORDER — MIDAZOLAM HCL 2 MG/ML PO SYRP
16.0000 mg | ORAL_SOLUTION | Freq: Once | ORAL | Status: AC
  Administered 2019-09-08: 16 mg via ORAL
  Filled 2019-09-08: qty 8

## 2019-09-08 MED ORDER — KETOROLAC TROMETHAMINE 60 MG/2ML IM SOLN: 60.0000 mg | Freq: Once | INTRAMUSCULAR | Status: AC

## 2019-09-08 MED ORDER — KETOROLAC TROMETHAMINE 30 MG/ML IJ SOLN
INTRAMUSCULAR | Status: AC
  Administered 2019-09-08: 60 mg via INTRAMUSCULAR
  Filled 2019-09-08: qty 2

## 2019-09-08 MED ORDER — SUGAMMADEX SODIUM 200 MG/2ML IV SOLN
INTRAVENOUS | Status: DC | PRN
  Administered 2019-09-08: 200 mg via INTRAVENOUS

## 2019-09-08 MED ORDER — MIDAZOLAM HCL 2 MG/2ML IJ SOLN
INTRAMUSCULAR | Status: AC
  Filled 2019-09-08: qty 2

## 2019-09-08 MED ORDER — ROCURONIUM BROMIDE 10 MG/ML (PF) SYRINGE
PREFILLED_SYRINGE | INTRAVENOUS | Status: DC | PRN
  Administered 2019-09-08: 50 mg via INTRAVENOUS

## 2019-09-08 MED ORDER — PROPOFOL 10 MG/ML IV BOLUS
INTRAVENOUS | Status: AC
  Filled 2019-09-08: qty 20

## 2019-09-08 MED ORDER — LIDOCAINE HCL (PF) 1 % IJ SOLN
INTRAMUSCULAR | Status: DC | PRN
  Administered 2019-09-08: 5 mL

## 2019-09-08 MED ORDER — PROPOFOL 10 MG/ML IV BOLUS
INTRAVENOUS | Status: DC | PRN
  Administered 2019-09-08: 100 mg via INTRAVENOUS
  Administered 2019-09-08: 40 mg via INTRAVENOUS

## 2019-09-08 MED ORDER — ALCOHOL 98 % IJ SOLN
40.0000 mL | Freq: Once | INTRAMUSCULAR | Status: DC
  Filled 2019-09-08: qty 40

## 2019-09-08 MED ORDER — ORAL CARE MOUTH RINSE: 15.0000 mL | Freq: Once | OROMUCOSAL | Status: AC

## 2019-09-08 MED ORDER — FENTANYL CITRATE (PF) 250 MCG/5ML IJ SOLN
INTRAMUSCULAR | Status: DC | PRN
  Administered 2019-09-08: 100 ug via INTRAVENOUS
  Administered 2019-09-08: 50 ug via INTRAVENOUS

## 2019-09-08 MED ORDER — SODIUM CHLORIDE 0.9 % IV SOLN: INTRAVENOUS | Status: DC

## 2019-09-08 NOTE — H&P (Signed)
Chief Complaint: Patient was seen in consultation today for right renal cyst aspiration and probable ablation at the request of Dr Marcelyn Bruins   Supervising Physician: Gilmer Mor  Patient Status: [facility] - Out-pt  History of Present Illness: Lori Miles is a 24 y.o. female   Autistic pt Mother at bedside Known recurrent right renal cyst and UTIs Follows with Dr Logan Bores Urology at [facility]   Has had previous aspiration of same 03/22/2014 with Dr Archer Asa in IR 100 cc fluid- no labs performed This aspiration apparently relieved pts symptoms  Currently, pt has been showing signs of urination changes at home Mother believes cyst has returned; frequency especially Consulted with Dr Loreta Ave for possible treatment 07/29/19 He is aware of imaging from [facility] which reveals recurrence of cyst  Pt is scheduled now for right renal cyst aspiration/possible ablation under General anesthesia    Past Medical History:  Diagnosis Date  . Asthma   . Autism    mom states no actual dx, but admits she has some symptoms  . Bacteriuria   . Chronic constipation   . Cognitive developmental delay   . Constipation   . Disruptive behavior disorder   . Dyspnea    if she walks much  . Family history of adverse reaction to anesthesia     "hives"-? if anesthesia did take antibiotic  . Frequency-urgency syndrome   . Gait disorder    ORGANIC PER NERUOLGIST NOTE (DR HICKLING)  . 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 GERD (gastroesophageal reflux disease)   . Gout   . H/O sinus tachycardia   . History of acute respiratory failure 02/14/2015   SECONDARY TO CAP  . History of recurrent UTIs   . Incontinent of feces   . Interstitial cystitis   . Moderate intellectual disability   . OSA (obstructive sleep apnea)    no machine yet   . Partial epilepsy with impairment of consciousness (HCC)    FOLLOWED BY DR HICKLING  . Pneumonia 2    4 times this year ( 2020)  .  PONV (postoperative nausea and vomiting)   . Seizure (HCC)    most recent sz " at the end of summer"-last sz years ago per mother  . Urinary incontinence     Past Surgical History:  Procedure Laterality Date  . CYSTOSCOPY N/A 06/03/2017   Procedure: CYSTOSCOPY WITH EXAM UNDER ANESTHESIA;  Surgeon: Jerilee Field, MD;  Location: [facility];  Service: Urology;  Laterality: N/A;  . CYSTOSCOPY  2020  . CYSTOSCOPY WITH HYDRODISTENSION AND BIOPSY  04/14/2012   Procedure: CYSTOSCOPY/BIOPSY/HYDRODISTENSION;  Surgeon: Lindaann Slough, MD;  Location: [facility];  Service: Urology;;  instillation of marcaine and pyridium  . EUA/  PAP SMEAR/  BREAST EXAM  03-12-2016   dr Erin Fulling [facility]  . EUA/ VAGINOSCOPY/ COLPOSCOPY FO THE HYMENAL RING  10-04-2009    dr Tamela Oddi  [facility]  . IR RADIOLOGIST EVAL & MGMT  07/29/2019  . TONSILLECTOMY      Allergies: Abilify [aripiprazole], Seroquel [quetiapine fumarate], Amoxicillin-pot clavulanate, Bactrim [sulfamethoxazole-trimethoprim], Doxycycline, Macrobid [nitrofurantoin macrocrystal], and Keppra [levetiracetam]  Medications: Prior to Admission medications   Medication Sig Start Date End Date Taking? Authorizing Provider  acetaminophen (TYLENOL) 325 MG tablet Take 2 tablets (650 mg total) by mouth every 6 (six) hours as needed for mild pain or moderate pain. 02/27/17  Yes Everlene Farrier, PA-C  albuterol (PROVENTIL) (2.5 MG/3ML) 0.083% nebulizer solution USE 1 VIAL IN NEBULIZER  EVERY 4 TO 6 HOURS AS NEEDED FOR COUGH Patient taking differently: Take 2.5 mg by nebulization every 4 (four) hours as needed for wheezing or shortness of breath.  06/24/19  Yes Valentina Shaggy, MD  benzonatate (TESSALON) 200 MG capsule TAKE 1 CAPSULE(200 MG) BY MOUTH THREE TIMES DAILY AS NEEDED Patient taking differently: Take 200 mg by mouth 3 (three) times daily as needed for cough.  08/26/18  Yes Hoyt Koch, MD  budesonide (PULMICORT) 1  MG/2ML nebulizer solution Inhale 1 vial 4 times per day, during upper respiratory infections and asthma flares for 1-2 weeks Patient taking differently: Take 1 mg by nebulization 4 (four) times daily. Inhale 1 vial 4 times per day, during upper respiratory infections and asthma flares for 1-2 weeks 11/20/18  Yes Ambs, Kathrine Cords, FNP  cetirizine (ZYRTEC) 10 MG tablet TAKE 1 TABLET(10 MG) BY MOUTH DAILY Patient taking differently: Take 10 mg by mouth daily.  06/03/19  Yes Kozlow, Donnamarie Poag, MD  DEPAKOTE 500 MG DR tablet TAKE 1 TABLET BY MOUTH IN THE MORNING AND 2 AT NIGHT Patient taking differently: Take 500-1,000 mg by mouth See admin instructions. Take 500mg  in the morning and 1000mg  at night. 07/14/19  Yes Goodpasture, Otila Kluver, NP  famotidine (PEPCID) 20 MG tablet Take 1 tablet (20 mg total) by mouth every 12 (twelve) hours. 01/21/19  Yes Pyrtle, Lajuan Lines, MD  fluconazole (DIFLUCAN) 150 MG tablet Take 150 mg by mouth as needed (when on antibiotics).   Yes [provider]  hydrOXYzine (ATARAX/VISTARIL) 25 MG tablet Take 25 mg by mouth 3 (three) times daily as needed (pain).  01/13/19  Yes [provider]  Levonorgestrel-Ethinyl Estradiol (SEASONIQUE) 0.15-0.03 &0.01 MG tablet Take 1 tablet by mouth daily. 01/28/19  Yes Lajean Manes, CNM  Melatonin 3 MG TABS Take 3 mg by mouth at bedtime.    Yes [provider]  montelukast (SINGULAIR) 10 MG tablet TAKE 1 TABLET(10 MG) BY MOUTH AT BEDTIME Patient taking differently: Take 10 mg by mouth at bedtime.  06/03/19  Yes Kozlow, Donnamarie Poag, MD  ondansetron (ZOFRAN ODT) 4 MG disintegrating tablet Take 1 tablet (4 mg total) by mouth every 8 (eight) hours as needed for nausea. Patient taking differently: Take 8 mg by mouth every 8 (eight) hours as needed for nausea.  06/07/18  Yes Larene Pickett, PA-C  PERFOROMIST 20 MCG/2ML nebulizer solution USE 2 MLS BY NEBULIZATION TWICE DAILY Patient taking differently: Take 20 mcg by nebulization 2 (two) times  daily.  06/10/19  Yes Kozlow, Donnamarie Poag, MD  polyethylene glycol powder (GLYCOLAX/MIRALAX) 17 GM/SCOOP powder Dissolve 17 grams (1 capful) in at least 8 ounces of water/juice and drink twice daily Patient taking differently: Take 17 g by mouth See admin instructions. Dissolve 17 grams (1 capful) in at least 8 ounces of water/juice and drink twice daily 02/15/19  Yes Pyrtle, Lajuan Lines, MD  senna (SENOKOT) 8.6 MG tablet Take 1 tablet by mouth at bedtime.    Yes [provider]  traZODone (DESYREL) 50 MG tablet Take 2 tablets (100 mg total) by mouth at bedtime. 03/10/19  Yes Goodpasture, Otila Kluver, NP  YUPELRI 175 MCG/3ML SOLN USE 1 VIAL VIA NEBULIZER DAILY Patient taking differently: Inhale 3 mLs into the lungs daily.  05/13/19  Yes Kozlow, Donnamarie Poag, MD  amoxicillin (AMOXIL) 500 MG tablet Take 500 mg by mouth 3 (three) times daily. 05/21/19   [provider]  cephALEXin (KEFLEX) 500 MG capsule Take 500 mg by mouth  3 (three) times daily.  05/04/19   [provider]  clotrimazole-betamethasone (LOTRISONE) cream Apply to affected area 2 times daily prn Patient taking differently: Apply 1 application topically 2 (two) times daily as needed.  06/29/18   Elvina Sidle, MD  fluticasone (FLONASE) 50 MCG/ACT nasal spray Place 1 spray into both nostrils daily. 08/03/19   Alfonse Spruce, MD  fosfomycin (MONUROL) 3 g PACK Take 3 grams by mouth  every 3 DAYS for 4 doses Patient taking differently: Take 3 g by mouth See admin instructions. Every 3 daysfor 4 doses 03/29/19   Judyann Munson, MD  mometasone (NASONEX) 50 MCG/ACT nasal spray Use one spray in each nostril once daily as directed. 05/06/19   Kozlow, Alvira Philips, MD  Nebulizer MISC Adult mask with tubing. Use as directed with nebulizer device. 11/18/18   Kozlow, Alvira Philips, MD  predniSONE (DELTASONE) 10 MG tablet Take three tablets twice daily for 7 days. Patient taking differently: Take 30 mg by mouth in the morning and at bedtime. For 7 days. 05/18/19    Alfonse Spruce, MD  promethazine (PHENERGAN) 25 MG suppository Place 1 suppository (25 mg total) rectally every 6 (six) hours as needed for nausea or vomiting. 04/28/19   Melene Plan, DO  promethazine (PHENERGAN) 25 MG tablet Take 1 tablet (25 mg total) by mouth every 6 (six) hours as needed for nausea or vomiting. 04/28/19   Melene Plan, DO  terconazole (TERAZOL 7) 0.4 % vaginal cream Place 1 applicator vaginally at bedtime. Patient taking differently: Place 1 applicator vaginally at bedtime as needed (yeast).  01/28/19   Sharyon Cable, CNM     Family History  Problem Relation Age of Onset  . Seizures Mother   . Seizures Sister        1 sister has  . Pancreatic cancer Sister   . Colon cancer Neg Hx   . Colon polyps Neg Hx   . Esophageal cancer Neg Hx   . Rectal cancer Neg Hx   . Stomach cancer Neg Hx     Social History   Socioeconomic History  . Marital status: Single    Spouse name: Not on file  . Number of children: Not on file  . Years of education: Not on file  . Highest education level: Not on file  Occupational History  . Not on file  Tobacco Use  . Smoking status: Never Smoker  . Smokeless tobacco: Never Used  Vaping Use  . Vaping Use: Never used  Substance and Sexual Activity  . Alcohol use: No    Alcohol/week: 0.0 standard drinks  . Drug use: No  . Sexual activity: Never    Birth control/protection: Pill  Other Topics Concern  . Not on file  Social History Narrative   Lives with mom ([name] Sadik) and half-sister Pollie Poma) who is also mentally impaired.  Has CAP assistance, urinary incontinence, needs help with feeding,dressing, toileting   Graduated from [employer].  She enjoys playing with cars, dancing, and dogs.   Social Determinants of Health   Financial Resource Strain:   . Difficulty of Paying Living Expenses:   Food Insecurity:   . Worried About Programme researcher, broadcasting/film/video in the Last Year:   . Barista in the Last Year:    Transportation Needs:   . Freight forwarder (Medical):   Marland Kitchen Lack of Transportation (Non-Medical):   Physical Activity:   . Days of Exercise per Week:   .  Minutes of Exercise per Session:   Stress:   . Feeling of Stress :   Social Connections:   . Frequency of Communication with Friends and Family:   . Frequency of Social Gatherings with Friends and Family:   . Attends Religious Services:   . Active Member of Clubs or Organizations:   . Attends Banker Meetings:   Marland Kitchen Marital Status:      Review of Systems: A 12 point ROS discussed and pertinent positives are indicated in the HPI above.  All other systems are negative.  Review of Systems  Constitutional: Negative for fever.  Gastrointestinal: Negative for abdominal pain.    Vital Signs: There were no vitals taken for this visit.  Physical Exam Vitals reviewed.  HENT:     Mouth/Throat:     Mouth: Mucous membranes are moist.  Cardiovascular:     Rate and Rhythm: Normal rate and regular rhythm.     Heart sounds: Normal heart sounds.  Pulmonary:     Effort: Pulmonary effort is normal.     Breath sounds: Normal breath sounds.  Abdominal:     Palpations: Abdomen is soft.  Musculoskeletal:        General: Normal range of motion.  Skin:    General: Skin is warm.  Neurological:     Mental Status: She is alert. Mental status is at baseline.     Comments: Autistic Pleasant Follows commands  Psychiatric:     Comments: Mother at bedside Consented for pt     Imaging: No results found.  Labs:  CBC: Recent Labs    05/09/19 1344 05/17/19 1156 09/06/19 1032 09/08/19 0624  WBC 15.5* 14.1* 10.2 17.4*  HGB 11.9* 11.8* 12.7 13.2  HCT 36.9 35.2* 38.5 40.5  PLT 224 231 236 229    COAGS: Recent Labs    04/26/19 1038 09/08/19 0624  INR 1.1* 1.1    BMP: Recent Labs    04/27/19 1025 05/09/19 1344 05/17/19 1156 09/06/19 1032  NA 137 139 137 137  K 4.1 4.1 3.5 4.8  CL 106 106 106 105  CO2  18* 23 23 22   GLUCOSE 169* 102* 107* 91  BUN 13 9 16 15   CALCIUM 8.9 8.5* 8.2* 9.5  CREATININE 0.99 0.76 0.89 0.96  GFRNONAA >60 >60 >60 >60  GFRAA >60 >60 >60 >60    LIVER FUNCTION TESTS: Recent Labs    09/10/18 0003 04/27/19 1025 05/09/19 1344  BILITOT 0.2* 0.6 0.4  AST 15 26 17   ALT 14 22 16   ALKPHOS 37* 42 39  PROT 6.4* 6.4* 5.8*  ALBUMIN 3.1* 3.0* 2.6*    TUMOR MARKERS: No results for input(s): AFPTM, CEA, CA199, CHROMGRNA in the last 8760 hours.  Assessment and Plan:  Recurrent right renal cyst Scheduled for aspiration/ablation in IR pts Mother is aware of procedure benefits and risks including but not limited to Infection; bleeding; damage to surrounding structures. Agreeable to proceed Consent signed and in chart Pt may be admitted overnight- 24 hr observation-- Dr 06/25/19 will make decision after procedure  Thank you for this interesting consult.  I greatly enjoyed meeting Lori Miles and look forward to participating in their care.  A copy of this report was sent to the requesting provider on this date.  Electronically Signed: 05/11/19, PA-C 09/08/2019, 7:25 AM   I spent a total of  30 Minutes   in face to face in clinical consultation, greater than 50% of which was counseling/coordinating care  for right renal cyst aspiration/ablation

## 2019-09-08 NOTE — Progress Notes (Signed)
Patient in PACU/recovery.  Prescription given to patient's mother: Ibuprofen 600 mg TID, #10.   - Alwyn Ren, NP Interventional Radiology  -

## 2019-09-08 NOTE — Transfer of Care (Signed)
Immediate Anesthesia Transfer of Care Note  Patient: Lori Miles  Procedure(s) Performed: IR Sclerotherapy Treatment (N/A )  Patient Location: PACU  Anesthesia Type:General  Level of Consciousness: awake and alert   Airway & Oxygen Therapy: Patient Spontanous Breathing and Patient connected to nasal cannula oxygen  Post-op Assessment: Report given to RN and Post -op Vital signs reviewed and stable  Post vital signs: Reviewed and stable  Last Vitals:  Vitals Value Taken Time  BP 113/78 09/08/19 1040  Temp    Pulse 105 09/08/19 1041  Resp 14 09/08/19 1041  SpO2 100 % 09/08/19 1041  Vitals shown include unvalidated device data.  Last Pain:  Vitals:   09/08/19 0730  TempSrc: Oral         Complications: No complications documented.

## 2019-09-08 NOTE — Anesthesia Procedure Notes (Signed)
Procedure Name: Intubation Date/Time: 09/08/2019 9:03 AM Performed by: Shirlyn Goltz, CRNA Pre-anesthesia Checklist: Patient identified, Emergency Drugs available, Suction available and Patient being monitored Patient Re-evaluated:Patient Re-evaluated prior to induction Oxygen Delivery Method: Circle system utilized Preoxygenation: Pre-oxygenation with 100% oxygen Induction Type: IV induction Ventilation: Mask ventilation without difficulty Laryngoscope Size: Mac and 4 Grade View: Grade I Tube type: Oral Tube size: 7.0 mm Number of attempts: 1 Airway Equipment and Method: Stylet Placement Confirmation: ETT inserted through vocal cords under direct vision,  positive ETCO2 and breath sounds checked- equal and bilateral Secured at: 22 cm Tube secured with: Tape Dental Injury: Teeth and Oropharynx as per pre-operative assessment

## 2019-09-08 NOTE — Therapy (Signed)
[facility] [address] [place], [place], [address] Phone: [phone]   Fax:  [phone]  Physical Therapy Treatment & 10th Visit Progress Note    Reporting Period 06-01-19 - 09-07-19 Please see below for progress towards goals   Patient Details  Name: Lori Miles MRN: [id number] Date of Birth: [birth year] Referring Provider (PT): Rockwell Germany, NP   Encounter Date: 09/07/2019   PT End of Session - 09/08/19 1948    Visit Number 10    Number of Visits 11    Date for PT Re-Evaluation 10/15/19    Authorization Type UHC Medicare    Authorization Time Period 06-01-19 - 08-30-19; 07-20-19 - 7- 5-21    PT Start Time 0803    PT Stop Time 0845    PT Time Calculation (min) 42 min    Activity Tolerance Patient tolerated treatment well    Behavior During Therapy [facility] for tasks assessed/performed           Past Medical History:  Diagnosis Date  . Asthma   . Autism    mom states no actual dx, but admits she has some symptoms  . Bacteriuria   . Chronic constipation   . Cognitive developmental delay   . Constipation   . Disruptive behavior disorder   . Dyspnea    if she walks much  . Family history of adverse reaction to anesthesia     "hives"-? if anesthesia did take antibiotic  . Frequency-urgency syndrome   . Gait disorder    ORGANIC PER NERUOLGIST NOTE (DR HICKLING)  . Generalized convulsive epilepsy ([place]) NEUROLOGIST-  DR HICKLING  . GERD (gastroesophageal reflux disease)   . Gout   . H/O sinus tachycardia   . History of acute respiratory failure 02/14/2015   SECONDARY TO CAP  . History of recurrent UTIs   . Incontinent of feces   . Interstitial cystitis   . Moderate intellectual disability   . OSA (obstructive sleep apnea)    no machine yet   . Partial epilepsy with impairment of consciousness ([place])    FOLLOWED BY DR HICKLING  . Pneumonia 2    4 times this year ( 2020)  . PONV (postoperative  nausea and vomiting)   . Seizure ([place])    most recent sz " at the end of summer"-last sz years ago per mother  . Urinary incontinence     Past Surgical History:  Procedure Laterality Date  . CYSTOSCOPY N/A 06/03/2017   Procedure: CYSTOSCOPY WITH EXAM UNDER ANESTHESIA;  Surgeon: Festus Aloe, MD;  Location: [facility];  Service: Urology;  Laterality: N/A;  . CYSTOSCOPY  2020  . CYSTOSCOPY WITH HYDRODISTENSION AND BIOPSY  04/14/2012   Procedure: CYSTOSCOPY/BIOPSY/HYDRODISTENSION;  Surgeon: Hanley Ben, MD;  Location: [facility];  Service: Urology;;  instillation of marcaine and pyridium  . EUA/  PAP SMEAR/  BREAST EXAM  03-12-2016   dr Ihor Dow [facility]  . EUA/ VAGINOSCOPY/ COLPOSCOPY FO THE HYMENAL RING  10-04-2009    dr Delsa Sale  [facility]  . IR RADIOLOGIST EVAL & MGMT  07/29/2019  . IR SCLEROTHERAPY OF A FLUID COLLECTION  09/08/2019  . TONSILLECTOMY      There were no vitals filed for this visit.   Subjective Assessment - 09/08/19 1936    Subjective Mother reports no changes since last PT visit; has surgery to remove the cyst on her kidney tomorrow (on 09-08-19)    Patient is accompained by: Family member  Patient Stated Goals improve strength; improve balance; try to improve breathing function (I informed pt's mother that we do not specifically address this problem at [facility] but cardiovascular exercise would be included with exercises)    Currently in Pain? No/denies                             [facility] Adult PT Treatment/Exercise - 09/08/19 0001      Transfers   Transfers Sit to Stand    Number of Reps 10 reps    Comments 5 reps feet on floor, 5 reps feet on Airex      Ambulation/Gait   Ambulation/Gait Yes    Ambulation/Gait Assistance 5: Supervision    Ambulation Distance (Feet) 115 Feet   230', 230' (575' total )   Assistive device None    Gait Pattern Within Functional Limits    Ambulation Surface Level;Indoor     Stairs Yes    Stairs Assistance 5: Supervision    Stair Management Technique Two rails;Forwards;Alternating pattern;Step to pattern   alternating pattern ascending; step to descending    Number of Stairs 4    Height of Stairs 6      Knee/Hip Exercises: Aerobic   Recumbent Bike SciFit level 2.5 x 5" with 3 short rest breaks - w/UE's & LE's      Knee/Hip Exercises: Machines for Strengthening   Cybex Leg Press 50# bil. LE's 3 sets 10 reps               Balance Exercises - 09/08/19 0001      Balance Exercises: Standing   Rockerboard Anterior/posterior;EO;Other reps (comment);UE support   2 sets 10 reps   Other Standing Exercises obstacle course - standing on stepping stones - negotiating stones (5 used) to improve SLS: cone taps to 4 cones     Other Standing Exercises Comments pt performed balance activity - standing on Airex - reaching for bean bags and tossing in basket for improved  balance on compliant surfaces                   PT Long Term Goals - 09/08/19 1949      PT LONG TERM GOAL #1   Title Pt. will amb. 500' on flat, even surface without LOB.      Baseline 350'without use of device prior to pt needing seated rest break    Time 6    Period Weeks    Status On-going    Target Date 09/24/19      PT LONG TERM GOAL #2   Title Pt will perform at least 10" on recumbent bike nonstop for improved endurance/activity tolerance.    Baseline 5" with 1 short rest break on 09-07-19    Time 6    Period Weeks    Status On-going    Target Date 09/24/19      PT LONG TERM GOAL #3   Title Mother will verbalize understanding of HEP to assist with LE strengthening.      Baseline met 09-07-19    Time 6    Period Weeks    Status Achieved      PT LONG TERM GOAL #4   Title Perform sit to stand 5 reps without UE support from mat to demo increased bil. LE strength.    Baseline met 07-20-19    Time 6    Period Weeks    Status Achieved  Plan - 09/08/19  1952    Clinical Impression Statement This 10th visit progress note covers dates 06-01-19 - 09-07-19.  Pt has met LTG #3 with LTG's #1 and 2 ongoing as not fully achieved.  Pt able to amb. approx. 350' prior to c/o fatigue with request for seated rest period.  Pt has obtained new [place] sneakers which help to reduce gait devaitions;  Pt's Lt foot continues to be slightly internally rotated with lateral border of foot at initial contact of stance phase of gait.    PT Frequency 1x / week    PT Duration 4 weeks    PT Treatment/Interventions ADLs/Self Care Home Management;Balance training;Gait training;Neuromuscular re-education;Stair training;Patient/family education;Therapeutic activities;Therapeutic exercise    PT Next Visit Plan gait train, balance exercises, SciFit    Consulted and Agree with Plan of Care Patient;Family member/caregiver    Family Member Consulted mother [name]           Patient will benefit from skilled therapeutic intervention in order to improve the following deficits and impairments:     Visit Diagnosis: Other abnormalities of gait and mobility  Muscle weakness (generalized)  Unsteadiness on feet     Problem List Patient Active Problem List   Diagnosis Date Noted  . Complex care coordination 05/18/2019  . Screening for cervical cancer 02/26/2019  . Glucosuria 06/01/2018  . Steroid-induced diabetes ([place]) 06/01/2018  . Not well controlled moderate persistent asthma 04/29/2018  . Gastroesophageal reflux disease 04/29/2018  . Recurrent infections 04/29/2018  . Dysuria 02/20/2018  . Chronic rhinitis 04/03/2017  . Recurrent falls 03/21/2017  . Weakness 03/21/2017  . Venous insufficiency 03/21/2017  . OSA on CPAP 09/30/2016  . Excessive daytime sleepiness 03/05/2016  . Bronchitis 05/05/2015  . 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 Cough variant asthma vs UACS/ vcd  03/15/2015  . Autism spectrum disorder 02/14/2015  . Nausea with vomiting 02/14/2015  .  Obesity, morbid ([place]) 12/13/2014  . 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 Acanthosis nigricans 11/14/2014  . Toeing-in 08/29/2014  . Generalized convulsive epilepsy ([place]) 09/28/2012  . Partial epilepsy with impairment of consciousness ([place]) 09/28/2012  . 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 Recurrent urinary tract infection 08/01/2011  . Asthma 08/01/2011  . Chronic constipation 08/01/2011  . Contraception 08/01/2011    Alda Lea, [place] 09/08/2019, 8:03 PM  [facility] [address] [place], [place], [address] Phone: [phone]   Fax:  [phone]  Name: Lori Miles MRN: [id number] Date of Birth: August 24, 1995

## 2019-09-08 NOTE — Procedures (Signed)
Interventional Radiology Procedure Note  Procedure:   Image guided aspiration and sclerotherapy of a symptomatic right renal cyst, via 65F drain.  Cyst-o-gram to confirm the cyst is separate from any other structure. Aspirated ~60cc of brown, thin fluid.  ETOH sclerotherapy with 30cc.  dwell.   GETA.   EBL: 0  Complications: None  Recommendations:  - Routine wound care on right flank - To PACU for observation - Will observe her recovery, to determine need for admission.  DW bed-placement, and if admission needed, she would be #29 on the waiting list for bed.  - Follow up in clinic with Dr. Loreta Ave in ~4 weeks.  - Continue care with [facility] on schedule  Signed,  Yvone Neu. Loreta Ave, DO

## 2019-09-09 ENCOUNTER — Encounter (HOSPITAL_COMMUNITY): Payer: Self-pay | Admitting: Radiology

## 2019-09-09 NOTE — Anesthesia Postprocedure Evaluation (Signed)
Anesthesia Post Note  Patient: Lori Miles  Procedure(s) Performed: IR Sclerotherapy Treatment (N/A )     Patient location during evaluation: PACU Anesthesia Type: General Level of consciousness: awake and alert Pain management: pain level controlled Vital Signs Assessment: post-procedure vital signs reviewed and stable Respiratory status: spontaneous breathing, nonlabored ventilation, respiratory function stable and patient connected to nasal cannula oxygen Cardiovascular status: blood pressure returned to baseline and stable Postop Assessment: no apparent nausea or vomiting Anesthetic complications: no   No complications documented.  Last Vitals:  Vitals:   09/08/19 1140 09/08/19 1155  BP: 109/87 123/77  Pulse: (!) 106 (!) 107  Resp: 20 16  Temp:    SpO2: 99% 97%    Last Pain:  Vitals:   09/08/19 1140  TempSrc:   PainSc: 0-No pain                 Tajon Moring S

## 2019-09-10 ENCOUNTER — Ambulatory Visit (HOSPITAL_COMMUNITY): Admission: RE | Admit: 2019-09-10 | Payer: Medicare Other | Source: Ambulatory Visit

## 2019-09-14 ENCOUNTER — Encounter: Payer: Self-pay | Admitting: Physical Therapy

## 2019-09-14 ENCOUNTER — Other Ambulatory Visit: Payer: Self-pay

## 2019-09-14 ENCOUNTER — Ambulatory Visit: Payer: Medicare Other | Admitting: Physical Therapy

## 2019-09-14 DIAGNOSIS — M6281 Muscle weakness (generalized): Secondary | ICD-10-CM

## 2019-09-14 DIAGNOSIS — R2681 Unsteadiness on feet: Secondary | ICD-10-CM

## 2019-09-14 DIAGNOSIS — R2689 Other abnormalities of gait and mobility: Secondary | ICD-10-CM

## 2019-09-15 NOTE — Therapy (Signed)
[facility] [address] [place], [place], [address] Phone: [phone]   Fax:  [phone]  Physical Therapy Treatment  Patient Details  Name: Lori Miles MRN: [id number] Date of Birth: 22-Jul-1995 Referring Provider (PT): Rockwell Germany, NP   Encounter Date: 09/14/2019   PT End of Session - 09/15/19 0656    Visit Number 11    Number of Visits 13    Date for PT Re-Evaluation 10/15/19    Authorization Type UHC Medicare    Authorization Time Period 06-01-19 - 08-30-19; 07-20-19 - 7- 5-21    PT Start Time 0802    PT Stop Time 0845    PT Time Calculation (min) 43 min    Activity Tolerance Patient tolerated treatment well    Behavior During Therapy [facility] for tasks assessed/performed           Past Medical History:  Diagnosis Date  . Asthma   . Autism    mom states no actual dx, but admits she has some symptoms  . Bacteriuria   . Chronic constipation   . Cognitive developmental delay   . Constipation   . Disruptive behavior disorder   . Dyspnea    if she walks much  . Family history of adverse reaction to anesthesia     "hives"-? if anesthesia did take antibiotic  . Frequency-urgency syndrome   . Gait disorder    ORGANIC PER NERUOLGIST NOTE (DR HICKLING)  . Generalized convulsive epilepsy ([place]) NEUROLOGIST-  DR HICKLING  . GERD (gastroesophageal reflux disease)   . Gout   . H/O sinus tachycardia   . History of acute respiratory failure 02/14/2015   SECONDARY TO CAP  . History of recurrent UTIs   . Incontinent of feces   . Interstitial cystitis   . Moderate intellectual disability   . OSA (obstructive sleep apnea)    no machine yet   . Partial epilepsy with impairment of consciousness ([place])    FOLLOWED BY DR HICKLING  . Pneumonia 2    4 times this year ( 2020)  . PONV (postoperative nausea and vomiting)   . Seizure ([place])    most recent sz " at the end of summer"-last sz years ago per mother  .  Urinary incontinence     Past Surgical History:  Procedure Laterality Date  . CYSTOSCOPY N/A 06/03/2017   Procedure: CYSTOSCOPY WITH EXAM UNDER ANESTHESIA;  Surgeon: Festus Aloe, MD;  Location: [facility];  Service: Urology;  Laterality: N/A;  . CYSTOSCOPY  2020  . CYSTOSCOPY WITH HYDRODISTENSION AND BIOPSY  04/14/2012   Procedure: CYSTOSCOPY/BIOPSY/HYDRODISTENSION;  Surgeon: Hanley Ben, MD;  Location: [facility];  Service: Urology;;  instillation of marcaine and pyridium  . EUA/  PAP SMEAR/  BREAST EXAM  03-12-2016   dr Ihor Dow [facility]  . EUA/ VAGINOSCOPY/ COLPOSCOPY FO THE HYMENAL RING  10-04-2009    dr Delsa Sale  [facility]  . IR RADIOLOGIST EVAL & MGMT  07/29/2019  . IR SCLEROTHERAPY OF A FLUID COLLECTION  09/08/2019  . RADIOLOGY WITH ANESTHESIA N/A 09/08/2019   Procedure: IR Sclerotherapy Treatment;  Surgeon: Radiologist, Medication, MD;  Location: [facility];  Service: Radiology;  Laterality: N/A;  . TONSILLECTOMY      There were no vitals filed for this visit.   Subjective Assessment - 09/15/19 0648    Subjective Mother states pt is on new antibiotic -now has a new PCP (located on [place]); says surgery (to remove cyst)  went well last week    Patient is accompained by: Family member    Patient Stated Goals improve strength; improve balance; try to improve breathing function (I informed pt's mother that we do not specifically address this problem at [facility] but cardiovascular exercise would be included with exercises)    Currently in Pain? No/denies                             [facility] Adult PT Treatment/Exercise - 09/15/19 0001      Transfers   Transfers Sit to Stand    Number of Reps Other reps (comment)   5 reps   Comments 5 reps feet on Airex - no UE support used      Ambulation/Gait   Ambulation/Gait Yes    Ambulation/Gait Assistance 5: Supervision    Ambulation Distance (Feet) 230 Feet   230', 115', 230', 350' - laps  amb. during session   Assistive device None    Gait Pattern Within Functional Limits    Ambulation Surface Level;Indoor    Stairs Yes    Stairs Assistance 5: Supervision    Stair Management Technique Two rails;Forwards;Alternating pattern;Step to pattern   alternating pattern ascending; step to descending    Number of Stairs 4    Height of Stairs 6      Knee/Hip Exercises: Aerobic   Recumbent Bike SciFit level 2.5 x 5" with 3 short rest breaks - w/UE's & LE's      Knee/Hip Exercises: Machines for Strengthening   Cybex Leg Press 50# bil. LE's 3 sets 10 reps               Balance Exercises - 09/15/19 0001      Balance Exercises: Standing   Rockerboard Anterior/posterior;EO;Other reps (comment);UE support   2 sets 10 reps   Other Standing Exercises obstacle course - pt stepped over 1 orange hurdle, touched 2 stepping stone for targets for imrpoved SLS, cone taps to 4 cones progressing to tipping cone over and then standing back upright for improved SLS and coordination - standing on floor with CGA                   PT Long Term Goals - 09/15/19 5809      PT LONG TERM GOAL #1   Title Pt. will amb. 500' on flat, even surface without LOB.      Baseline 350'without use of device prior to pt needing seated rest break    Time 6    Period Weeks    Status On-going      PT LONG TERM GOAL #2   Title Pt will perform at least 10" on recumbent bike nonstop for improved endurance/activity tolerance.    Baseline 5" with 1 short rest break on 09-07-19    Time 6    Period Weeks    Status On-going      PT LONG TERM GOAL #3   Title Mother will verbalize understanding of HEP to assist with LE strengthening.      Baseline met 09-07-19    Time 6    Period Weeks    Status Achieved      PT LONG TERM GOAL #4   Title Perform sit to stand 5 reps without UE support from mat to demo increased bil. LE strength.    Baseline met 07-20-19    Time 6    Period Weeks    Status Achieved  Plan - 09/15/19 0657    Clinical Impression Statement Pt progressing towards goals - pt demonstrating improved endurance with pt amb. total of 10 laps  (1150') throughout session but continues to need coercion and encouragement to continue participation in activities.  Pt reported knee pain in tall kneeling position, but appeared to be due to pt not wanting to perform the activity in this position, wanting activity to be discontinued.    PT Frequency 1x / week    PT Duration 4 weeks    PT Treatment/Interventions ADLs/Self Care Home Management;Balance training;Gait training;Neuromuscular re-education;Stair training;Patient/family education;Therapeutic activities;Therapeutic exercise    PT Next Visit Plan gait train, balance exercises, SciFit    Consulted and Agree with Plan of Care Patient;Family member/caregiver    Family Member Consulted mother [name]           Patient will benefit from skilled therapeutic intervention in order to improve the following deficits and impairments:     Visit Diagnosis: Other abnormalities of gait and mobility  Muscle weakness (generalized)  Unsteadiness on feet     Problem List Patient Active Problem List   Diagnosis Date Noted  . Complex care coordination 05/18/2019  . Screening for cervical cancer 02/26/2019  . Glucosuria 06/01/2018  . Steroid-induced diabetes ([place]) 06/01/2018  . Not well controlled moderate persistent asthma 04/29/2018  . Gastroesophageal reflux disease 04/29/2018  . Recurrent infections 04/29/2018  . Dysuria 02/20/2018  . Chronic rhinitis 04/03/2017  . Recurrent falls 03/21/2017  . Weakness 03/21/2017  . Venous insufficiency 03/21/2017  . OSA on CPAP 09/30/2016  . Excessive daytime sleepiness 03/05/2016  . Bronchitis 05/05/2015  . 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 Cough variant asthma vs UACS/ vcd  03/15/2015  . Autism spectrum disorder 02/14/2015  . Nausea with vomiting 02/14/2015  .  Obesity, morbid ([place]) 12/13/2014  . 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 Acanthosis nigricans 11/14/2014  . Toeing-in 08/29/2014  . Generalized convulsive epilepsy ([place]) 09/28/2012  . Partial epilepsy with impairment of consciousness ([place]) 09/28/2012  . 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 Recurrent urinary tract infection 08/01/2011  . Asthma 08/01/2011  . Chronic constipation 08/01/2011  . Contraception 08/01/2011    Alda Lea, PT 09/15/2019, 7:04 AM  [facility] [address] [place], [place], [address] Phone: [phone]   Fax:  [phone]  Name: Lori Miles MRN: [id number] Date of Birth: 1995-05-21

## 2019-09-21 ENCOUNTER — Ambulatory Visit: Payer: Medicare Other | Attending: Family | Admitting: Physical Therapy

## 2019-09-21 ENCOUNTER — Other Ambulatory Visit: Payer: Self-pay

## 2019-09-21 ENCOUNTER — Encounter: Payer: Self-pay | Admitting: Physical Therapy

## 2019-09-21 DIAGNOSIS — M6281 Muscle weakness (generalized): Secondary | ICD-10-CM | POA: Diagnosis present

## 2019-09-21 DIAGNOSIS — R2681 Unsteadiness on feet: Secondary | ICD-10-CM

## 2019-09-21 DIAGNOSIS — R2689 Other abnormalities of gait and mobility: Secondary | ICD-10-CM | POA: Diagnosis present

## 2019-09-21 NOTE — Therapy (Signed)
[facility] [address] [place], [place], [address] Phone: [phone]   Fax:  [phone]           Physical Therapy Treatment & Discharge summary Patient Details  Name: Lori Miles MRN: [id number] Date of Birth: August 17, 1995 Referring Provider (PT): Rockwell Germany, NP   Encounter Date: 09/21/2019   PT End of Session - 09/21/19 1452    Visit Number 12    Number of Visits 13    Date for PT Re-Evaluation 10/15/19    Authorization Type [employer] Medicare    Authorization Time Period 06-01-19 - 08-30-19; 07-20-19 - 7- 5-21    PT Start Time 0803    PT Stop Time 0846    PT Time Calculation (min) 43 min    Activity Tolerance Patient tolerated treatment well    Behavior During Therapy [facility] for tasks assessed/performed           Past Medical History:  Diagnosis Date  . Asthma   . Autism    mom states no actual dx, but admits she has some symptoms  . Bacteriuria   . Chronic constipation   . Cognitive developmental delay   . Constipation   . Disruptive behavior disorder   . Dyspnea    if she walks much  . Family history of adverse reaction to anesthesia     "hives"-? if anesthesia did take antibiotic  . Frequency-urgency syndrome   . Gait disorder    ORGANIC PER NERUOLGIST NOTE (DR HICKLING)  . Generalized convulsive epilepsy ([place]) NEUROLOGIST-  DR HICKLING  . GERD (gastroesophageal reflux disease)   . Gout   . H/O sinus tachycardia   . History of acute respiratory failure 02/14/2015   SECONDARY TO CAP  . History of recurrent UTIs   . Incontinent of feces   . Interstitial cystitis   . Moderate intellectual disability   . OSA (obstructive sleep apnea)    no machine yet   . Partial epilepsy with impairment of consciousness ([place])    FOLLOWED BY DR HICKLING  . Pneumonia 2    4 times this year ( 2020)  . PONV (postoperative nausea and vomiting)   . Seizure ([place])    most recent sz " at the end of summer"-last sz  years ago per mother  . Urinary incontinence     Past Surgical History:  Procedure Laterality Date  . CYSTOSCOPY N/A 06/03/2017   Procedure: CYSTOSCOPY WITH EXAM UNDER ANESTHESIA;  Surgeon: Festus Aloe, MD;  Location: [facility];  Service: Urology;  Laterality: N/A;  . CYSTOSCOPY  2020  . CYSTOSCOPY WITH HYDRODISTENSION AND BIOPSY  04/14/2012   Procedure: CYSTOSCOPY/BIOPSY/HYDRODISTENSION;  Surgeon: Hanley Ben, MD;  Location: [place];  Service: Urology;;  instillation of marcaine and pyridium  . EUA/  PAP SMEAR/  BREAST EXAM  03-12-2016   dr Ihor Dow [facility]  . EUA/ VAGINOSCOPY/ COLPOSCOPY FO THE HYMENAL RING  10-04-2009    dr Delsa Sale  [facility]  . IR RADIOLOGIST EVAL & MGMT  07/29/2019  . IR SCLEROTHERAPY OF A FLUID COLLECTION  09/08/2019  . RADIOLOGY WITH ANESTHESIA N/A 09/08/2019   Procedure: IR Sclerotherapy Treatment;  Surgeon: Radiologist, Medication, MD;  Location: [place];  Service: Radiology;  Laterality: N/A;  . TONSILLECTOMY      There were no vitals filed for this visit.   Subjective Assessment - 09/21/19 1446    Subjective Mother reports no new problems -states this is her last day  for a while (D/C planned today)    Patient is accompained by: Family member    Patient Stated Goals improve strength; improve balance; try to improve breathing function (I informed pt's mother that we do not specifically address this problem at [facility] but cardiovascular exercise would be included with exercises)    Currently in Pain? No/denies                             [facility] Adult PT Treatment/Exercise - 09/21/19 0001      Transfers   Transfers Sit to Stand    Number of Reps Other reps (comment)   5   Comments 5 reps feet on Airex - no UE support used      Ambulation/Gait   Ambulation/Gait Yes    Ambulation/Gait Assistance 5: Supervision    Ambulation Distance (Feet) 230 Feet   10 additional laps = 1150   Assistive device None      Gait Pattern Within Functional Limits    Ambulation Surface Level;Indoor    Stairs Yes    Stairs Assistance 5: Supervision    Stair Management Technique Two rails;Forwards;Alternating pattern;Step to pattern   alternating pattern ascending; step to descending    Number of Stairs 4    Height of Stairs 6      Knee/Hip Exercises: Aerobic   Recumbent Bike SciFit level 2.5 x 6" with 2 short rest breaks - w/UE's & LE's      Knee/Hip Exercises: Machines for Strengthening   Cybex Leg Press 50# bil. LE's 3 sets 10 reps      Knee/Hip Exercises: Standing   Forward Step Up Right;Left;1 set;Hand Hold: 2;Step Height: 6"             Cone taps to 4 cones - then progressing to tipping over and standing upright with CGA          PT Long Term Goals - 09/21/19 1453      PT LONG TERM GOAL #1   Title Pt. will amb. 500' on flat, even surface without LOB.      Baseline met 09-21-19    Time 6    Period Weeks    Status Achieved      PT LONG TERM GOAL #2   Title Pt will perform at least 10" on recumbent bike nonstop for improved endurance/activity tolerance.    Baseline 5" with 1 short rest break on 09-07-19; 6" on 09-21-19    Time 6    Period Weeks    Status Not Met      PT LONG TERM GOAL #3   Title Mother will verbalize understanding of HEP to assist with LE strengthening.      Baseline met 09-07-19    Time 6    Period Weeks    Status Achieved      PT LONG TERM GOAL #4   Title Perform sit to stand 5 reps without UE support from mat to demo increased bil. LE strength.    Baseline met 07-20-19    Time 6    Period Weeks    Status Achieved                 Plan - 09/21/19 1456    Clinical Impression Statement Pt has met all LTG's except #2 -pt performed 6" on SCiFit with approx. 3 short rest periods. Pt has plateaued in maximizing functional progress at this time.    PT  Frequency 1x / week    PT Duration 4 weeks    PT Treatment/Interventions ADLs/Self Care Home  Management;Balance training;Gait training;Neuromuscular re-education;Stair training;Patient/family education;Therapeutic activities;Therapeutic exercise    PT Next Visit Plan D/C    Consulted and Agree with Plan of Care Patient;Family member/caregiver    Family Member Consulted mother [name]           Patient will benefit from skilled therapeutic intervention in order to improve the following deficits and impairments:     Visit Diagnosis: Other abnormalities of gait and mobility  Muscle weakness (generalized)  Unsteadiness on feet     Problem List Patient Active Problem List   Diagnosis Date Noted  . Complex care coordination 05/18/2019  . Screening for cervical cancer 02/26/2019  . Glucosuria 06/01/2018  . Steroid-induced diabetes ([place]) 06/01/2018  . Not well controlled moderate persistent asthma 04/29/2018  . Gastroesophageal reflux disease 04/29/2018  . Recurrent infections 04/29/2018  . Dysuria 02/20/2018  . Chronic rhinitis 04/03/2017  . Recurrent falls 03/21/2017  . Weakness 03/21/2017  . Venous insufficiency 03/21/2017  . OSA on CPAP 09/30/2016  . Excessive daytime sleepiness 03/05/2016  . Bronchitis 05/05/2015  . 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 Cough variant asthma vs UACS/ vcd  03/15/2015  . Autism spectrum disorder 02/14/2015  . Nausea with vomiting 02/14/2015  . Obesity, morbid ([place]) 12/13/2014  . 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 Acanthosis nigricans 11/14/2014  . Toeing-in 08/29/2014  . Generalized convulsive epilepsy ([place]) 09/28/2012  . Partial epilepsy with impairment of consciousness ([place]) 09/28/2012  . 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 Recurrent urinary tract infection 08/01/2011  . Asthma 08/01/2011  . Chronic constipation 08/01/2011  . Contraception 08/01/2011    PHYSICAL THERAPY DISCHARGE SUMMARY  Visits from  Start of Care: 12  Current functional level related to goals / functional outcomes: See above for progress towards goals   Remaining deficits: Cont. High level balance deficits and gait deviations with LLE internally rotating in swing/stance phase   Education / Equipment: HEP for LE strengthening and cardiovascular conditioning Plan: Patient agrees to discharge.  Patient goals were met. Patient is being discharged due to meeting the stated rehab goals.  ?????       Alda Lea, PT 09/21/2019, 3:03 PM  [facility] [address] [place], [place], [address] Phone: [phone]   Fax:  [phone]  Name: Lori Miles MRN: [id number] Date of Birth: [birth year]

## 2019-09-27 ENCOUNTER — Telehealth: Payer: Self-pay

## 2019-09-27 ENCOUNTER — Other Ambulatory Visit: Payer: Self-pay | Admitting: Interventional Radiology

## 2019-09-27 DIAGNOSIS — N281 Cyst of kidney, acquired: Secondary | ICD-10-CM

## 2019-09-27 NOTE — Telephone Encounter (Signed)
Patient's mother called requesting advice for patient's treatment. Patient was previously seen by Dr. Drue Second however her mother did not feel she was treating the patient's concerns appropriately. Dr. Drue Second recommended that another provider at [facility] assess patient to consider alternative treatments. Dr. Drue Second was treating UTI however mother did not complete regimens. Also reports multiple episodes of pneumonia. Asked for antibiotics that cover recurrent UTI's + sinus infections. Patient was seen at [facility] and underwent IR procedure on bladder. Patient scheduled with Dr. Daiva Eves 11/29/2019. Instructed mother to follow urologists instructions and take medication as prescribed until patient is seen.     Rickia Freeburg Loyola Mast, RN

## 2019-09-30 ENCOUNTER — Encounter: Payer: Self-pay | Admitting: *Deleted

## 2019-09-30 ENCOUNTER — Other Ambulatory Visit: Payer: Self-pay

## 2019-09-30 ENCOUNTER — Ambulatory Visit
Admission: RE | Admit: 2019-09-30 | Discharge: 2019-09-30 | Disposition: A | Discharge status: Home / Self Care | Payer: Medicare Other | Source: Ambulatory Visit | Attending: Interventional Radiology | Admitting: Interventional Radiology

## 2019-09-30 DIAGNOSIS — N281 Cyst of kidney, acquired: Secondary | ICD-10-CM

## 2019-09-30 HISTORY — PX: IR RADIOLOGIST EVAL & MGMT: IMG5224

## 2019-09-30 NOTE — Progress Notes (Signed)
Chief Complaint: Right renal cyst, symptomatic, SP therapy with aspiration & sclerosis  Referring Physician(s): Dr Marcelyn Bruins, [facility] Urology Dr. Yetta Flock, [facility] Urology  History of Present Illness: Lori Miles is a 24 y.o. 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  .    Lori Miles's mother took the phone call today via telemedicine phone call, given the current COVID situation.  I confirmed the patient's identity with 2 identifiers.   Lori Miles is a young woman with autism, and as such it is difficult to assess the location and nature of her symptoms.    After our treatment, she was discharged home on the same day, and her mother tells me she did fine.  She had no complaints or problems in the first 24-48 hours.  She changed the bandage repeatedly for some local bleeding, but her mother tells me that her daughter was probing the area at the bandage and "might have caused some of the bleeding."  Apparently it was only local oozing at the site of drain insertion and was easily managed with bandaid changes. At this point she has healed completely and she has no concern for infection.   Her mother says she feels that her daughter has had symptom relief.  She does have some ongoing symptoms at her flanks, indicated by her daughters behaviors, and that she has been treated for UTI's since her discharge.    I think it is difficult to pinpoint the etiology of her symptoms given the history of cystitis and UTI's.    She has follow up with Urology.    Past Medical History:  Diagnosis Date  . Asthma   . Autism    mom states no actual dx, but admits she has some symptoms  . Bacteriuria   . Chronic constipation   . Cognitive developmental delay   . Constipation   . Disruptive behavior disorder   . Dyspnea    if she walks much  . Family history of  adverse reaction to anesthesia     "hives"-? if anesthesia did take antibiotic  . Frequency-urgency syndrome   . Gait disorder    ORGANIC PER NERUOLGIST NOTE (DR HICKLING)  . 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 GERD (gastroesophageal reflux disease)   . Gout   . H/O sinus tachycardia   . History of acute respiratory failure 02/14/2015   SECONDARY TO CAP  . History of recurrent UTIs   . Incontinent of feces   . Interstitial cystitis   . Moderate intellectual disability   . OSA (obstructive sleep apnea)    no machine yet   . Partial epilepsy with impairment of consciousness (HCC)    FOLLOWED BY DR HICKLING  . Pneumonia 2    4 times this year ( 2020)  . PONV (postoperative nausea and vomiting)   . Seizure (HCC)    most recent sz " at the end of summer"-last sz years ago per mother  . Urinary incontinence     Past Surgical History:  Procedure Laterality Date  . CYSTOSCOPY N/A 06/03/2017   Procedure: CYSTOSCOPY WITH EXAM UNDER ANESTHESIA;  Surgeon: Jerilee Field, MD;  Location: [facility];  Service: Urology;  Laterality: N/A;  . CYSTOSCOPY  2020  . CYSTOSCOPY WITH HYDRODISTENSION AND BIOPSY  04/14/2012   Procedure: CYSTOSCOPY/BIOPSY/HYDRODISTENSION;  Surgeon: Lindaann Slough, MD;  Location: [facility];  Service: Urology;;  instillation of marcaine and pyridium  . EUA/  PAP SMEAR/  BREAST EXAM  03-12-2016   dr Erin Fulling [facility]  . EUA/ VAGINOSCOPY/ COLPOSCOPY FO THE HYMENAL RING  10-04-2009    dr Tamela Oddi  [facility]  . IR RADIOLOGIST EVAL & MGMT  07/29/2019  . IR SCLEROTHERAPY OF A FLUID COLLECTION  09/08/2019  . RADIOLOGY WITH ANESTHESIA N/A 09/08/2019   Procedure: IR Sclerotherapy Treatment;  Surgeon: Radiologist, Medication, MD;  Location: [facility] OR;  Service: Radiology;  Laterality: N/A;  . TONSILLECTOMY      Allergies: Abilify [aripiprazole], Seroquel [quetiapine fumarate], Amoxicillin-pot clavulanate, Bactrim  [sulfamethoxazole-trimethoprim], Doxycycline, Macrobid [nitrofurantoin macrocrystal], and Keppra [levetiracetam]  Medications: Prior to Admission medications   Medication Sig Start Date End Date Taking? Authorizing Provider  acetaminophen (TYLENOL) 325 MG tablet Take 2 tablets (650 mg total) by mouth every 6 (six) hours as needed for mild pain or moderate pain. 02/27/17   Everlene Farrier, PA-C  albuterol (PROVENTIL) (2.5 MG/3ML) 0.083% nebulizer solution USE 1 VIAL IN NEBULIZER EVERY 4 TO 6 HOURS AS NEEDED FOR COUGH Patient taking differently: Take 2.5 mg by nebulization every 4 (four) hours as needed for wheezing or shortness of breath.  06/24/19   Alfonse Spruce, MD  amoxicillin (AMOXIL) 500 MG tablet Take 500 mg by mouth 3 (three) times daily. 05/21/19   [provider]  benzonatate (TESSALON) 200 MG capsule TAKE 1 CAPSULE(200 MG) BY MOUTH THREE TIMES DAILY AS NEEDED Patient taking differently: Take 200 mg by mouth 3 (three) times daily as needed for cough.  08/26/18   Myrlene Broker, MD  budesonide (PULMICORT) 1 MG/2ML nebulizer solution Inhale 1 vial 4 times per day, during upper respiratory infections and asthma flares for 1-2 weeks Patient taking differently: Take 1 mg by nebulization 4 (four) times daily. Inhale 1 vial 4 times per day, during upper respiratory infections and asthma flares for 1-2 weeks 11/20/18   Ambs, Norvel Richards, FNP  cephALEXin (KEFLEX) 500 MG capsule Take 500 mg by mouth 3 (three) times daily.  05/04/19   [provider]  cetirizine (ZYRTEC) 10 MG tablet TAKE 1 TABLET(10 MG) BY MOUTH DAILY Patient taking differently: Take 10 mg by mouth daily.  06/03/19   Kozlow, Alvira Philips, MD  clotrimazole-betamethasone (LOTRISONE) cream Apply to affected area 2 times daily prn Patient taking differently: Apply 1 application topically 2 (two) times daily as needed.  06/29/18   Elvina Sidle, MD  DEPAKOTE 500 MG DR tablet TAKE 1 TABLET BY MOUTH IN THE MORNING AND 2 AT  NIGHT Patient taking differently: Take 500-1,000 mg by mouth See admin instructions. Take 500mg  in the morning and 1000mg  at night. 07/14/19   , NP  famotidine (PEPCID) 20 MG tablet Take 1 tablet (20 mg total) by mouth every 12 (twelve) hours. 01/21/19   Pyrtle, Elveria Rising, MD  fluconazole (DIFLUCAN) 150 MG tablet Take 150 mg by mouth as needed (when on antibiotics).    [provider]  fluticasone (FLONASE) 50 MCG/ACT nasal spray Place 1 spray into both nostrils daily. 08/03/19   Carie Caddy, MD  fosfomycin (MONUROL) 3 g PACK Take 3 grams by mouth  every 3 DAYS for 4 doses Patient taking differently: Take 3 g by mouth See admin instructions. Every 3 daysfor 4 doses 03/29/19   Alfonse Spruce, MD  hydrOXYzine (ATARAX/VISTARIL) 25 MG tablet Take 25 mg by mouth 3 (three) times daily as needed (pain).  01/13/19  [provider]  Levonorgestrel-Ethinyl Estradiol (SEASONIQUE) 0.15-0.03 &0.01 MG tablet Take 1 tablet by mouth daily. 01/28/19   Sharyon Cableogers, Veronica C, CNM  Melatonin 3 MG TABS Take 3 mg by mouth at bedtime.     [provider]  mometasone (NASONEX) 50 MCG/ACT nasal spray Use one spray in each nostril once daily as directed. 05/06/19   Kozlow, Alvira PhilipsEric J, MD  montelukast (SINGULAIR) 10 MG tablet TAKE 1 TABLET(10 MG) BY MOUTH AT BEDTIME Patient taking differently: Take 10 mg by mouth at bedtime.  06/03/19   Kozlow, Alvira PhilipsEric J, MD  Nebulizer MISC Adult mask with tubing. Use as directed with nebulizer device. 11/18/18   Kozlow, Alvira PhilipsEric J, MD  ondansetron (ZOFRAN ODT) 4 MG disintegrating tablet Take 1 tablet (4 mg total) by mouth every 8 (eight) hours as needed for nausea. Patient taking differently: Take 8 mg by mouth every 8 (eight) hours as needed for nausea.  06/07/18   Garlon HatchetSanders, Lisa M, PA-C  PERFOROMIST 20 MCG/2ML nebulizer solution USE 2 MLS BY NEBULIZATION TWICE DAILY Patient taking differently: Take 20 mcg by nebulization 2 (two) times daily.  06/10/19   Kozlow,  Alvira PhilipsEric J, MD  polyethylene glycol powder (GLYCOLAX/MIRALAX) 17 GM/SCOOP powder Dissolve 17 grams (1 capful) in at least 8 ounces of water/juice and drink twice daily Patient taking differently: Take 17 g by mouth See admin instructions. Dissolve 17 grams (1 capful) in at least 8 ounces of water/juice and drink twice daily 02/15/19   Pyrtle, Carie CaddyJay M, MD  predniSONE (DELTASONE) 10 MG tablet Take three tablets twice daily for 7 days. Patient taking differently: Take 30 mg by mouth in the morning and at bedtime. For 7 days. 05/18/19   Alfonse SpruceGallagher, Joel Louis, MD  promethazine (PHENERGAN) 25 MG suppository Place 1 suppository (25 mg total) rectally every 6 (six) hours as needed for nausea or vomiting. 04/28/19   Melene PlanFloyd, Dan, DO  promethazine (PHENERGAN) 25 MG tablet Take 1 tablet (25 mg total) by mouth every 6 (six) hours as needed for nausea or vomiting. 04/28/19   Melene PlanFloyd, Dan, DO  senna (SENOKOT) 8.6 MG tablet Take 1 tablet by mouth at bedtime.     [provider]  terconazole (TERAZOL 7) 0.4 % vaginal cream Place 1 applicator vaginally at bedtime. Patient taking differently: Place 1 applicator vaginally at bedtime as needed (yeast).  01/28/19   Sharyon Cableogers, Veronica C, CNM  traZODone (DESYREL) 50 MG tablet Take 2 tablets (100 mg total) by mouth at bedtime. 03/10/19   Elveria RisingGoodpasture, Tina, NP  YUPELRI 175 MCG/3ML SOLN USE 1 VIAL VIA NEBULIZER DAILY Patient taking differently: Inhale 3 mLs into the lungs daily.  05/13/19   Kozlow, Alvira PhilipsEric J, MD     Family History  Problem Relation Age of Onset  . Seizures Mother   . Seizures Sister        1 sister has  . Pancreatic cancer Sister   . Colon cancer Neg Hx   . Colon polyps Neg Hx   . Esophageal cancer Neg Hx   . Rectal cancer Neg Hx   . Stomach cancer Neg Hx     Social History   Socioeconomic History  . Marital status: Single    Spouse name: Not on file  . Number of children: Not on file  . Years of education: Not on file  . Highest education level: Not  on file  Occupational History  . Not on file  Tobacco Use  . Smoking status: Never Smoker  . Smokeless  tobacco: Never Used  Vaping Use  . Vaping Use: Never used  Substance and Sexual Activity  . Alcohol use: No    Alcohol/week: 0.0 standard drinks  . Drug use: No  . Sexual activity: Never    Birth control/protection: Pill  Other Topics Concern  . Not on file  Social History Narrative   Lives with mom ([name] Demont) and half-sister Paeton Latouche) who is also mentally impaired.  Has CAP assistance, urinary incontinence, needs help with feeding,dressing, toileting   Graduated from [employer].  She enjoys playing with cars, dancing, and dogs.   Social Determinants of Health   Financial Resource Strain:   . Difficulty of Paying Living Expenses:   Food Insecurity:   . Worried About Programme researcher, broadcasting/film/video in the Last Year:   . Barista in the Last Year:   Transportation Needs:   . Freight forwarder (Medical):   Marland Kitchen Lack of Transportation (Non-Medical):   Physical Activity:   . Days of Exercise per Week:   . Minutes of Exercise per Session:   Stress:   . Feeling of Stress :   Social Connections:   . Frequency of Communication with Friends and Family:   . Frequency of Social Gatherings with Friends and Family:   . Attends Religious Services:   . Active Member of Clubs or Organizations:   . Attends Banker Meetings:   Marland Kitchen Marital Status:        Review of Systems  Review of Systems: A 12 point ROS discussed and pertinent positives are indicated in the HPI above.  All other systems are negative.  Physical Exam No direct physical exam was performed (except for noted visual exam findings with Video Visits).    Vital Signs: There were no vitals taken for this visit.  Imaging: IR SCLEROTHERAPY OF A FLUID COLLECTION  Result Date: 09/08/2019 INDICATION: 24 year old female with a symptomatic right renal cyst, presents for aspiration and possible  sclerotherapy EXAM: IMAGE GUIDED DRAINAGE OF RIGHT RENAL CYST ALCOHOL SCLEROSIS OF RIGHT RENAL CYST MEDICATIONS: 60 MG INTRAMUSCULAR TORADOL ANESTHESIA/SEDATION: General endotracheal tube anesthesia COMPLICATIONS: None PROCEDURE: Informed written consent was obtained from the patient after a thorough discussion of the procedural risks, benefits and alternatives. All questions were addressed. Maximal Sterile Barrier Technique was utilized including caps, mask, sterile gowns, sterile gloves, sterile drape, hand hygiene and skin antiseptic. A timeout was performed prior to the initiation of the procedure. Patient was brought to the interventional suite, with the patient identified properly. The anesthesia team was present for induction of general anesthesia. The patient was then positioned in the prone position. Scout images using ultrasound rib performed projecting over the right flank. 1% lidocaine was used for local anesthesia. Small stab incision was made with 11 blade scalpel. Using ultrasound guidance, Chiba needle from an Accustick set was used to access the right renal cyst. The inner dilator and the stiffener were removed, and spontaneous brown fluid reflux through the indwelling catheter confirming location. Modified Seldinger technique was then used to place a 10 French pigtail drainage catheter, after dilation of the soft tissues. With the pigtail catheter within the cyst, contrast was infused. Images were stored. The contrast was then aspirated, estimating approximately 55 cc volume of the cyst. We elected to sclerose the cyst with 30 cc of absolute alcohol. A volume of 30 cc absolute alcohol and 10 cc of contrast were infused into the cyst. Once the cyst was full of the solution,  a 20 minutes dwell time was observed. All of the fluid was then aspirated with the final image stored demonstrating complete decompression. Catheter was removed. Sterile bandage was placed. Patient tolerated the procedure well  and remained hemodynamically stable throughout. No complications were encountered and no significant blood loss. FINDINGS: Infusing contrast into the right renal cyst demonstrates no communication with any normal structure such as vasculature, lymphatics, or the right renal collecting system. As such, sclerosis was safely initiated. Estimated volume of the cyst is 55 cc. IMPRESSION: Status post image guided drainage of symptomatic right renal cyst with alcohol sclerosis performed. Signed, Yvone Neu. Reyne Dumas, RPVI [facility] Electronically Signed   By: Gilmer Mor D.O.   On: 09/08/2019 12:55    Labs:  CBC: Recent Labs    05/09/19 1344 05/17/19 1156 09/06/19 1032 09/08/19 0624  WBC 15.5* 14.1* 10.2 17.4*  HGB 11.9* 11.8* 12.7 13.2  HCT 36.9 35.2* 38.5 40.5  PLT 224 231 236 229    COAGS: Recent Labs    04/26/19 1038 09/08/19 0624  INR 1.1* 1.1    BMP: Recent Labs    04/27/19 1025 05/09/19 1344 05/17/19 1156 09/06/19 1032  NA 137 139 137 137  K 4.1 4.1 3.5 4.8  CL 106 106 106 105  CO2 18* GLUCOSE 169* 102* 107* 91  BUN CALCIUM 8.9 8.5* 8.2* 9.5  CREATININE 0.99 0.76 0.89 0.96  GFRNONAA >60 >60 >60 >60  GFRAA >60 >60 >60 >60    LIVER FUNCTION TESTS: Recent Labs    04/27/19 1025 05/09/19 1344  BILITOT 0.6 0.4  AST 26 17  ALT 22 16  ALKPHOS 42 39  PROT 6.4* 5.8*  ALBUMIN 3.0* 2.6*    TUMOR MARKERS: No results for input(s): AFPTM, CEA, CA199, CHROMGRNA in the last 8760 hours.  Assessment and Plan:  Lori Miles is a 24 yo autistic female SP treatment of a symptomatic right renal cyst with aspiration and ETOH sclerosis.    It seems she has had some relief, according to her mother's interpretation of her behaviors.  She does, however, have ongoing problems with cystitis and recurrent UTI.   I did let her know that there is no role for surveillance imaging, as this only adds  expense, and that any further treatments would be based on her daughter's symptoms.  If there is any repeat imaging in the future which shows any cystic change that could be a new/recurrent source of new symptoms, we are happy to review.   We are happy to see her as needed in the future, and she understands.       Electronically Signed: Gilmer Mor 09/30/2019, 8:28 AM   I spent a total of    25 Minutes in remote  clinical consultation, greater than 50% of which was counseling/coordinating care for follow up, SP image guided cyst aspiration and ETOH sclerosis, right renal cyst.    Visit type: Audio only (telephone). Audio (no video) only due to patient's lack of internet/smartphone capability. Alternative for in-person consultation at [facility], [address] [place], [place], [place]. This visit type was conducted due to national recommendations for restrictions regarding the COVID-19 Pandemic (e.g. social distancing).  This format is felt to be most appropriate for this patient at this time.  All issues noted in this document were discussed and addressed.

## 2019-10-04 ENCOUNTER — Other Ambulatory Visit (INDEPENDENT_AMBULATORY_CARE_PROVIDER_SITE_OTHER): Payer: Self-pay | Admitting: Family

## 2019-10-04 DIAGNOSIS — G4700 Insomnia, unspecified: Secondary | ICD-10-CM

## 2019-10-05 ENCOUNTER — Telehealth: Payer: Self-pay | Admitting: Acute Care

## 2019-10-05 NOTE — Telephone Encounter (Signed)
Attempted to call nurse practitioner Corrie Dandy at [facility], placed on hold for over 10 minutes, will try back later.

## 2019-10-06 ENCOUNTER — Other Ambulatory Visit: Payer: Self-pay | Admitting: Allergy and Immunology

## 2019-10-06 NOTE — Telephone Encounter (Signed)
I called [facility] to speak with [facility] NP, as they requested. I have a patient at 8 here. I explained that when [facility] answered the phone. I was on hold until 11 am, so I hung up so as not to miss the appointment with my patient here in our office.

## 2019-10-06 NOTE — Telephone Encounter (Signed)
Patient mom called and needs the 4 boxes of Pulmicort to [facility] on [address] . [phone].

## 2019-10-06 NOTE — Telephone Encounter (Signed)
Forwarding to [facility] since they would provider would like to speak with her  Please call [phone] for Corrie Dandy, NP with [facility]

## 2019-11-02 ENCOUNTER — Telehealth: Payer: Self-pay

## 2019-11-02 NOTE — Telephone Encounter (Signed)
Patients mom called stating [employer] [facility] has been trying to reach [name] for weeks regarding the patients replacement nebulizer. I have looked in epic and in the nurses station, but I do not see anything regarding [employer].   Mom gave me the number to the representative at [employer] [facility] she has been dealing with.  Mariah Milling  [phone] ext. [phone]  I called and left Judeth Cornfield a detailed voicemail regarding the patients nebulizer.  I gave mom the update regarding my call.   Will follow up again towards the end of the day.

## 2019-11-02 NOTE — Telephone Encounter (Signed)
Wynona Canes and I worked on the forms and I have faxed them back to [employer]. Patients mom has been informed.

## 2019-11-09 ENCOUNTER — Other Ambulatory Visit: Payer: Self-pay | Admitting: Allergy & Immunology

## 2019-11-09 ENCOUNTER — Other Ambulatory Visit: Payer: Self-pay | Admitting: Family Medicine

## 2019-11-10 ENCOUNTER — Other Ambulatory Visit (INDEPENDENT_AMBULATORY_CARE_PROVIDER_SITE_OTHER): Payer: Self-pay | Admitting: Family

## 2019-11-10 ENCOUNTER — Telehealth (INDEPENDENT_AMBULATORY_CARE_PROVIDER_SITE_OTHER): Payer: Self-pay

## 2019-11-10 DIAGNOSIS — G4700 Insomnia, unspecified: Secondary | ICD-10-CM

## 2019-11-10 MED ORDER — TRAZODONE HCL 50 MG PO TABS: ORAL_TABLET | ORAL | 1 refills | Status: DC

## 2019-11-10 NOTE — Telephone Encounter (Signed)
Refill has been sent to the pharmacy.  

## 2019-11-12 ENCOUNTER — Telehealth: Payer: Self-pay | Admitting: Allergy and Immunology

## 2019-11-12 NOTE — Telephone Encounter (Signed)
Called to inform patient's mother that 2 boxes were sent in, instead of 4 because patient is due for an office visit. Mom then stated that she just had a telephone visit not too long ago, but when looking in her chart, she last had a "sick televisit" with Dr. Dellis Anes in March. She was last seen in the office 02/2019. Informed mom that we would need to see [facility] for a visit before any more refills could be sent in. Mom then became upset and stated that she was not going to bring [place] into the office due to "all of this mess," and that Kajah had a lot going on. I apologized and said it was our policy that the patient must be seen for further refills. She stated that she wanted to be scheduled for a televisit with Dr. Lucie Leather, and I informed her that Dr. Lucie Leather does not do any virtual visits, she said "he will for [facility], just ask the man." I then again informed her that Dr. Lucie Leather does not do any televisits, that I could let her speak with one of the other doctors. She did not want to speak to any of the doctors, only Dr. Lucie Leather. She became so mad that she raised her voice at me and stated, "stop trying to argue with me," then she hung up on me.  After hanging up on me, she returned a call to the office, speaking with Patsy. She scheduled an upcoming televisit with Thurston Hole.

## 2019-11-12 NOTE — Telephone Encounter (Signed)
Patient mom called and said that she went and pick up the Pulmicort for neb. And there was only 2 boxes and she always get 4 boxes because if she has asthma flare she has enough to give her. So she needs to pick up 4 at a time. [facility] on [address]. [phone]

## 2019-11-23 ENCOUNTER — Ambulatory Visit: Payer: Medicare Other | Admitting: Family Medicine

## 2019-11-25 NOTE — Patient Instructions (Addendum)
Asthma Continue budesonide 0.5 mg via nebulizer twice a day to prevent cough or wheeze Continue Perforomist via nebulizer twice a day to prevent cough or wheeze Continue montelukast 10 mg once a day to prevent cough or wheeze continue Continue albuterol 2 puffs every 4 hours as needed for cough or wheeze For asthma flare, increase budesonide 0.5 mg via nebulizer to 4 times a day for 1 to 2 weeks or until cough and wheeze free  Allergic rhinitis Continue cetirizine 10 mg once a day as needed for a runny nose Continue Nasonex 1 to 2 sprays in each nostril once a day as needed for stuffy nose Consider saline nasal rinses as needed for nasal symptoms. Use this before any medicated nasal sprays for best result  Reflux Continue famotidine 20 mg twice a day Continue dietary and lifestyle modifications as listed below  Call the clinic if this treatment plan is not working well for you  Follow up in 6 months or sooner if needed.

## 2019-11-25 NOTE — Progress Notes (Signed)
[address] [address] Dept: [phone]  FOLLOW UP NOTE  Patient ID: Lori Miles, female    DOB: 27-Dec-1995  Age: 24 y.o. MRN: [id number] Date of Office Visit: 11/26/2019  Assessment  Chief Complaint: Asthma  HPI Lori Miles is a 24 year old female who presents to the clinic for follow-up visit.  She was last seen in this clinic on 05/18/2019 by Dr. Dellis Anes for evaluation of asthma, allergic rhinitis, and reflux.  She is accompanied by her mother who assists with history.  At today's visit she reports her asthma has been moderately well controlled with occasional wheeze occurring especially when she is experiencing a urinary tract infection. Mom has stopped giving Lori Miles about 2 months ago as she believed this medication was worsening her urinary tract symptoms. She continues budesonide 0.5 mg twice a day via nebulizer, Perforomist twice a day via nebulizer, and montelukast 10 mg once a day. Allergic rhinitis is reported as well controlled with cetirizine once a day and Flonase nasal spray as needed.  Reflux is reported as well controlled with famotidine twice a day.  Her current medications are listed in the chart.   Drug Allergies:  Allergies  Allergen Reactions  . Abilify [Aripiprazole] Swelling and Palpitations  . Seroquel [Quetiapine Fumarate] Palpitations  . Amoxicillin-Pot Clavulanate Hives and Other (See Comments)    Has patient had a PCN reaction causing immediate rash, facial/tongue/throat swelling, SOB or lightheadedness with hypotension: No Has patient had a PCN reaction causing severe rash involving mucus membranes or skin necrosis:    #  #  YES  #  #  Has patient had a PCN reaction that required hospitalization: No Has patient had a PCN reaction occurring within the last 10 years: No   . Bactrim [Sulfamethoxazole-Trimethoprim] Other (See Comments)    Abdominal pain  . Doxycycline Other (See Comments)    Stomach cramps  .  Macrobid [Nitrofurantoin Macrocrystal] Other (See Comments)    Sharp pain/ given with Tylenol 325 mg  . Keppra [Levetiracetam] Other (See Comments)    Aggressive behavior     Physical Exam: BP 110/72   Pulse 87   Temp 98.1 F (36.7 C) (Temporal)   Resp 20   SpO2 97%    Physical Exam Vitals reviewed.  Constitutional:      Appearance: Normal appearance.  HENT:     Head: Normocephalic and atraumatic.     Right Ear: Tympanic membrane normal.     Left Ear: Tympanic membrane normal.     Nose:     Comments: Bilateral nares slightly erythematous with clear nasal drainage noted.  Pharynx normal.  Ears normal.  Eyes normal.    Mouth/Throat:     Pharynx: Oropharynx is clear.  Eyes:     Conjunctiva/sclera: Conjunctivae normal.  Cardiovascular:     Rate and Rhythm: Normal rate and regular rhythm.     Heart sounds: Normal heart sounds. No murmur heard.   Pulmonary:     Effort: Pulmonary effort is normal.     Breath sounds: Normal breath sounds.     Comments: Lungs clear to auscultation Musculoskeletal:        General: Normal range of motion.     Cervical back: Normal range of motion and neck supple.  Skin:    General: Skin is warm and dry.  Neurological:     Mental Status: She is alert and oriented to person, place, and time.  Psychiatric:  Mood and Affect: Mood normal.        Behavior: Behavior normal.        Thought Content: Thought content normal.        Judgment: Judgment normal.     Diagnostics: FVC 2.16, FEV1 2.08.  Predicted FVC 2.53, predicted FEV1 2.24.  Spirometry indicates normal ventilatory function.  Assessment and Plan: 1. COPD with asthma (HCC)   2. Chronic rhinitis   3. Gastroesophageal reflux disease, unspecified whether esophagitis present   4. Recurrent infections     Meds ordered this encounter  Medications  . budesonide (PULMICORT) 1 MG/2ML nebulizer solution    Sig: USE 1 VIAL VIA NEBULIZER FOUR TIMES DAILY DURING RESPIRATORY INFECTIONS  AND ASTHMA FLARES FOR 1-2 WEEKS    Dispense:  240 mL    Refill:  5    Patient Instructions  Asthma Continue budesonide 0.5 mg via nebulizer twice a day to prevent cough or wheeze Continue Perforomist via nebulizer twice a day to prevent cough or wheeze Continue montelukast 10 mg once a day to prevent cough or wheeze continue Continue albuterol 2 puffs every 4 hours as needed for cough or wheeze For asthma flare, increase budesonide 0.5 mg via nebulizer to 4 times a day for 1 to 2 weeks or until cough and wheeze free  Allergic rhinitis Continue cetirizine 10 mg once a day as needed for a runny nose Continue Nasonex 1 to 2 sprays in each nostril once a day as needed for stuffy nose Consider saline nasal rinses as needed for nasal symptoms. Use this before any medicated nasal sprays for best result  Reflux Continue famotidine 20 mg twice a day Continue dietary and lifestyle modifications as listed below  Call the clinic if this treatment plan is not working well for you  Follow up in 6 months or sooner if needed.   Return in about 6 months (around 05/25/2020), or if symptoms worsen or fail to improve.    Thank you for the opportunity to care for this patient.  Please do not hesitate to contact me with questions.  Thermon Leyland, FNP [facility]

## 2019-11-26 ENCOUNTER — Other Ambulatory Visit: Payer: Self-pay

## 2019-11-26 ENCOUNTER — Ambulatory Visit (INDEPENDENT_AMBULATORY_CARE_PROVIDER_SITE_OTHER): Payer: Medicare Other | Admitting: Family Medicine

## 2019-11-26 ENCOUNTER — Encounter: Payer: Self-pay | Admitting: Family Medicine

## 2019-11-26 VITALS — BP 110/72 | HR 87 | Temp 98.1°F | Resp 20

## 2019-11-26 DIAGNOSIS — K219 Gastro-esophageal reflux disease without esophagitis: Secondary | ICD-10-CM

## 2019-11-26 DIAGNOSIS — B999 Unspecified infectious disease: Secondary | ICD-10-CM | POA: Diagnosis not present

## 2019-11-26 DIAGNOSIS — J449 Chronic obstructive pulmonary disease, unspecified: Secondary | ICD-10-CM | POA: Diagnosis not present

## 2019-11-26 DIAGNOSIS — J310 Chronic rhinitis: Secondary | ICD-10-CM | POA: Diagnosis not present

## 2019-11-26 MED ORDER — BUDESONIDE 1 MG/2ML IN SUSP: RESPIRATORY_TRACT | 5 refills | Status: DC

## 2019-11-29 ENCOUNTER — Other Ambulatory Visit: Payer: Self-pay

## 2019-11-29 ENCOUNTER — Encounter: Payer: Self-pay | Admitting: Infectious Disease

## 2019-11-29 ENCOUNTER — Ambulatory Visit (INDEPENDENT_AMBULATORY_CARE_PROVIDER_SITE_OTHER): Payer: Medicare Other | Admitting: Infectious Disease

## 2019-11-29 VITALS — BP 111/80 | HR 105 | Temp 98.4°F

## 2019-11-29 DIAGNOSIS — L304 Erythema intertrigo: Secondary | ICD-10-CM

## 2019-11-29 DIAGNOSIS — B999 Unspecified infectious disease: Secondary | ICD-10-CM

## 2019-11-29 DIAGNOSIS — N390 Urinary tract infection, site not specified: Secondary | ICD-10-CM

## 2019-11-29 DIAGNOSIS — N281 Cyst of kidney, acquired: Secondary | ICD-10-CM | POA: Diagnosis not present

## 2019-11-29 DIAGNOSIS — F819 Developmental disorder of scholastic skills, unspecified: Secondary | ICD-10-CM

## 2019-11-29 HISTORY — DX: Cyst of kidney, acquired: N28.1

## 2019-11-29 HISTORY — DX: Erythema intertrigo: L30.4

## 2019-11-29 MED ORDER — NYSTATIN 100000 UNIT/GM EX POWD
1.0000 | Freq: Three times a day (TID) | CUTANEOUS | 4 refills | Status: DC
Start: 2019-11-29 — End: 2020-03-01

## 2019-11-29 NOTE — Progress Notes (Signed)
Subjective:   Patient did not verbalize complaints   Patient ID: Lori Miles, female    DOB: Nov 19, 1995, 24 y.o.   MRN: [id number]  HPI Lori Miles comes acc by her Mother who does all of the talking during the visit.  She states that her daughter has been doing more "pulling:" on herself as when she gets UTI's. She shows me some incontinent urine that is "smelling badly". She says patient is completing cipro  She has also had a renal cyst treated by IR recently.  Says that she also frequently has seizures 1 suffering from urinary tract infections  He has also been suffering from what sounds like intertrigo taking fluconazole.  Despite this however she still has increased induration on the right side versus the left.  She also taking a topical steroid antifungal combination  Past Medical History:  Diagnosis Date  . Asthma   . Autism    mom states no actual dx, but admits she has some symptoms  . Bacteriuria   . Chronic constipation   . Cognitive developmental delay   . Constipation   . Disruptive behavior disorder   . Dyspnea    if she walks much  . Family history of adverse reaction to anesthesia     "hives"-? if anesthesia did take antibiotic  . Frequency-urgency syndrome   . Gait disorder    ORGANIC PER NERUOLGIST NOTE (DR HICKLING)  . 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 GERD (gastroesophageal reflux disease)   . Gout   . H/O sinus tachycardia   . History of acute respiratory failure 02/14/2015   SECONDARY TO CAP  . History of recurrent UTIs   . Incontinent of feces   . Interstitial cystitis   . Moderate intellectual disability   . OSA (obstructive sleep apnea)    no machine yet   . Partial epilepsy with impairment of consciousness (HCC)    FOLLOWED BY DR HICKLING  . Pneumonia 2    4 times this year ( 2020)  . PONV (postoperative nausea and vomiting)   . Renal cyst 11/29/2019  . Seizure (HCC)    most recent sz " at the end of  summer"-last sz years ago per mother  . Urinary incontinence     Past Surgical History:  Procedure Laterality Date  . CYSTOSCOPY N/A 06/03/2017   Procedure: CYSTOSCOPY WITH EXAM UNDER ANESTHESIA;  Surgeon: Jerilee Field, MD;  Location[facility];  Service: Urology;  Laterality: N/A;  . CYSTOSCOPY  2020  . CYSTOSCOPY WITH HYDRODISTENSION AND BIOPSY  04/14/2012   Procedure: CYSTOSCOPY/BIOPSY/HYDRODISTENSION;  Surgeon: Lindaann Slough, MD;  Location: [facility] [place];  Service: Urology;;  instillation of marcaine and pyridium  . EUA/  PAP SMEAR/  BREAST EXAM  03-12-2016   dr Erin Fulling [facility]  . EUA/ VAGINOSCOPY/ COLPOSCOPY FO THE HYMENAL RING  10-04-2009    dr Tamela Oddi  [facility]  . IR RADIOLOGIST EVAL & MGMT  07/29/2019  . IR RADIOLOGIST EVAL & MGMT  09/30/2019  . IR SCLEROTHERAPY OF A FLUID COLLECTION  09/08/2019  . RADIOLOGY WITH ANESTHESIA N/A 09/08/2019   Procedure: IR Sclerotherapy Treatment;  Surgeon: Radiologist, Medication, MD;  Location: [facility] OR;  Service: Radiology;  Laterality: N/A;  . TONSILLECTOMY      Family History  Problem Relation Age of Onset  . Seizures Mother   . Seizures Sister        1 sister has  . Pancreatic cancer Sister   .  Colon cancer Neg Hx   . Colon polyps Neg Hx   . Esophageal cancer Neg Hx   . Rectal cancer Neg Hx   . Stomach cancer Neg Hx       Social History   Socioeconomic History  . Marital status: Single    Spouse name: Not on file  . Number of children: Not on file  . Years of education: Not on file  . Highest education level: Not on file  Occupational History  . Not on file  Tobacco Use  . Smoking status: Never Smoker  . Smokeless tobacco: Never Used  Vaping Use  . Vaping Use: Never used  Substance and Sexual Activity  . Alcohol use: No    Alcohol/week: 0.0 standard drinks  . Drug use: No  . Sexual activity: Never    Birth control/protection: Pill  Other Topics Concern  . Not on file  Social History  Narrative   Lives with mom ([name] Noe) and half-sister Marily Konczal) who is also mentally impaired.  Has CAP assistance, urinary incontinence, needs help with feeding,dressing, toileting   Graduated from [employer].  She enjoys playing with cars, dancing, and dogs.   Social Determinants of Health   Financial Resource Strain:   . Difficulty of Paying Living Expenses: Not on file  Food Insecurity:   . Worried About Programme researcher, broadcasting/film/video in the Last Year: Not on file  . Ran Out of Food in the Last Year: Not on file  Transportation Needs:   . Lack of Transportation (Medical): Not on file  . Lack of Transportation (Non-Medical): Not on file  Physical Activity:   . Days of Exercise per Week: Not on file  . Minutes of Exercise per Session: Not on file  Stress:   . Feeling of Stress : Not on file  Social Connections:   . Frequency of Communication with Friends and Family: Not on file  . Frequency of Social Gatherings with Friends and Family: Not on file  . Attends Religious Services: Not on file  . Active Member of Clubs or Organizations: Not on file  . Attends Banker Meetings: Not on file  . Marital Status: Not on file    Allergies  Allergen Reactions  . Abilify [Aripiprazole] Swelling and Palpitations  . Seroquel [Quetiapine Fumarate] Palpitations  . Amoxicillin-Pot Clavulanate Hives and Other (See Comments)    Has patient had a PCN reaction causing immediate rash, facial/tongue/throat swelling, SOB or lightheadedness with hypotension: No Has patient had a PCN reaction causing severe rash involving mucus membranes or skin necrosis:    #  #  YES  #  #  Has patient had a PCN reaction that required hospitalization: No Has patient had a PCN reaction occurring within the last 10 years: No   . Bactrim [Sulfamethoxazole-Trimethoprim] Other (See Comments)    Abdominal pain  . Doxycycline Other (See Comments)    Stomach cramps  . Macrobid [Nitrofurantoin  Macrocrystal] Other (See Comments)    Sharp pain/ given with Tylenol 325 mg  . Keppra [Levetiracetam] Other (See Comments)    Aggressive behavior      Current Outpatient Medications:  .  acetaminophen (TYLENOL) 325 MG tablet, Take 2 tablets (650 mg total) by mouth every 6 (six) hours as needed for mild pain or moderate pain., Disp: 60 tablet, Rfl: 0 .  albuterol (PROVENTIL) (2.5 MG/3ML) 0.083% nebulizer solution, USE 1 VIAL VIA NEBULIZER EVERY 4 TO 6 HOURS AS NEEDED FOR COUGH, Disp:  150 mL, Rfl: 0 .  benzonatate (TESSALON) 200 MG capsule, TAKE 1 CAPSULE(200 MG) BY MOUTH THREE TIMES DAILY AS NEEDED (Patient taking differently: Take 200 mg by mouth 3 (three) times daily as needed for cough. ), Disp: 60 capsule, Rfl: 0 .  budesonide (PULMICORT) 1 MG/2ML nebulizer solution, USE 1 VIAL VIA NEBULIZER FOUR TIMES DAILY DURING RESPIRATORY INFECTIONS AND ASTHMA FLARES FOR 1-2 WEEKS, Disp: 240 mL, Rfl: 5 .  cephALEXin (KEFLEX) 500 MG capsule, Take 500 mg by mouth 3 (three) times daily. , Disp: , Rfl:  .  cetirizine (ZYRTEC) 10 MG tablet, TAKE 1 TABLET(10 MG) BY MOUTH DAILY (Patient taking differently: Take 10 mg by mouth daily. ), Disp: 30 tablet, Rfl: 5 .  clotrimazole-betamethasone (LOTRISONE) cream, Apply to affected area 2 times daily prn (Patient taking differently: Apply 1 application topically 2 (two) times daily as needed. ), Disp: 45 g, Rfl: 0 .  DEPAKOTE 500 MG DR tablet, TAKE 1 TABLET BY MOUTH IN THE MORNING AND 2 AT NIGHT (Patient taking differently: Take 500-1,000 mg by mouth See admin instructions. Take 500mg  in the morning and 1000mg  at night.), Disp: 90 tablet, Rfl: 5 .  famotidine (PEPCID) 20 MG tablet, Take 1 tablet (20 mg total) by mouth every 12 (twelve) hours., Disp: 180 tablet, Rfl: 3 .  fluconazole (DIFLUCAN) 150 MG tablet, Take 150 mg by mouth as needed (when on antibiotics)., Disp: , Rfl:  .  fluticasone (FLONASE) 50 MCG/ACT nasal spray, Place 1 spray into both nostrils daily., Disp:  1 g, Rfl: 5 .  fosfomycin (MONUROL) 3 g PACK, Take 3 grams by mouth  every 3 DAYS for 4 doses (Patient taking differently: Take 3 g by mouth See admin instructions. Every 3 daysfor 4 doses), Disp: 12 g, Rfl: 0 .  hydrOXYzine (ATARAX/VISTARIL) 25 MG tablet, Take 25 mg by mouth 3 (three) times daily as needed (pain). , Disp: , Rfl:  .  Levonorgestrel-Ethinyl Estradiol (SEASONIQUE) 0.15-0.03 &0.01 MG tablet, Take 1 tablet by mouth daily., Disp: 3 Package, Rfl: 3 .  Melatonin 3 MG TABS, Take 3 mg by mouth at bedtime. , Disp: , Rfl:  .  mometasone (NASONEX) 50 MCG/ACT nasal spray, Use one spray in each nostril once daily as directed., Disp: 17 g, Rfl: 1 .  montelukast (SINGULAIR) 10 MG tablet, TAKE 1 TABLET(10 MG) BY MOUTH AT BEDTIME (Patient taking differently: Take 10 mg by mouth at bedtime. ), Disp: 30 tablet, Rfl: 5 .  Nebulizer MISC, Adult mask with tubing. Use as directed with nebulizer device., Disp: 4 each, Rfl: 12 .  ondansetron (ZOFRAN ODT) 4 MG disintegrating tablet, Take 1 tablet (4 mg total) by mouth every 8 (eight) hours as needed for nausea. (Patient taking differently: Take 8 mg by mouth every 8 (eight) hours as needed for nausea. ), Disp: 10 tablet, Rfl: 0 .  PERFOROMIST 20 MCG/2ML nebulizer solution, USE 2 MLS BY NEBULIZATION TWICE DAILY (Patient taking differently: Take 20 mcg by nebulization 2 (two) times daily. ), Disp: 120 mL, Rfl: 5 .  polyethylene glycol powder (GLYCOLAX/MIRALAX) 17 GM/SCOOP powder, Dissolve 17 grams (1 capful) in at least 8 ounces of water/juice and drink twice daily (Patient taking differently: Take 17 g by mouth See admin instructions. Dissolve 17 grams (1 capful) in at least 8 ounces of water/juice and drink twice daily), Disp: 1020 g, Rfl: 1 .  promethazine (PHENERGAN) 25 MG tablet, Take 1 tablet (25 mg total) by mouth every 6 (six) hours as needed for nausea  or vomiting., Disp: 30 tablet, Rfl: 0 .  senna (SENOKOT) 8.6 MG tablet, Take 1 tablet by mouth at bedtime.  , Disp: , Rfl:  .  terconazole (TERAZOL 7) 0.4 % vaginal cream, Place 1 applicator vaginally at bedtime. (Patient taking differently: Place 1 applicator vaginally at bedtime as needed (yeast). ), Disp: 45 g, Rfl: 2 .  traZODone (DESYREL) 50 MG tablet, TAKE 2 TABLETS(100 MG) BY MOUTH AT BEDTIME, Disp: 62 tablet, Rfl: 1   Review of Systems  Unable to perform ROS: Patient nonverbal       Objective:   Physical Exam Vitals reviewed.  HENT:     Head: Normocephalic.  Cardiovascular:     Rate and Rhythm: Normal rate and regular rhythm.  Pulmonary:     Effort: Pulmonary effort is normal. No respiratory distress.     Breath sounds: No wheezing.  Neurological:     Mental Status: She is alert. Mental status is at baseline.     sf  She follows commands  Right leg is slighlty more indurated vs left    Assessment & Plan:  Recurrent UTIs: Very difficult to clearly identify when the patient is having urinary tract infections with her mother thinks she is having 1 now.  We will check a UA and culture.  Also will check a CBC and BMP.  Intertrigo: I have prescribed some nystatin topical for now and she can continue the Azo if needed.

## 2019-11-30 ENCOUNTER — Ambulatory Visit (INDEPENDENT_AMBULATORY_CARE_PROVIDER_SITE_OTHER): Payer: Medicare Other | Admitting: Family

## 2019-11-30 ENCOUNTER — Encounter (INDEPENDENT_AMBULATORY_CARE_PROVIDER_SITE_OTHER): Payer: Self-pay | Admitting: Family

## 2019-11-30 ENCOUNTER — Telehealth: Payer: Self-pay

## 2019-11-30 VITALS — BP 112/74 | HR 104 | Ht <= 58 in | Wt 181.6 lb

## 2019-11-30 DIAGNOSIS — R21 Rash and other nonspecific skin eruption: Secondary | ICD-10-CM

## 2019-11-30 DIAGNOSIS — G40309 Generalized idiopathic epilepsy and epileptic syndromes, not intractable, without status epilepticus: Secondary | ICD-10-CM

## 2019-11-30 DIAGNOSIS — Z79899 Other long term (current) drug therapy: Secondary | ICD-10-CM

## 2019-11-30 DIAGNOSIS — G40209 Localization-related (focal) (partial) symptomatic epilepsy and epileptic syndromes with complex partial seizures, not intractable, without status epilepticus: Secondary | ICD-10-CM

## 2019-11-30 DIAGNOSIS — G4700 Insomnia, unspecified: Secondary | ICD-10-CM

## 2019-11-30 MED ORDER — NYSTATIN 100000 UNIT/GM EX CREA: 1.0000 "application " | TOPICAL_CREAM | Freq: Two times a day (BID) | CUTANEOUS | 0 refills | Status: DC

## 2019-11-30 NOTE — Telephone Encounter (Signed)
Because she lacked white blood cells in the urine she lacks one of the 3 criteria for treating a UTI  #1 symptoms, #2 pyuria and #3 confirmation w culture. She needs all 3 otherwise no treatment is indicated

## 2019-11-30 NOTE — Telephone Encounter (Signed)
Received call from patient's mother. She wanted to let Dr. Daiva Eves know that patient was seen by her neurologist and nystatin power was added to medication profile along with nystatin cream. Mother would also like to go ahead and start medication for UTI being that patient has chronic UTIs. LPN advised that the culture results are currently pending and that will determine what antibiotic to start once resulted. Mother did not accept advise and would like for MD to start patient on antibiotic. Routing message to provider for advise.  Valarie Cones

## 2019-11-30 NOTE — Progress Notes (Signed)
Lori Miles   MRN:  [id number]  11-08-1995   Provider: Elveria Rising NP-C Location of Care: [facility]  Visit type: Routine Follow-Up  Last visit: 06/11/2019  Referral source: Clementeen Graham, PA-C History from: Epic chart, Mom  Brief history:  Copied from previous record: Generalized and convulsive epilepsy, intellectual disability, autism spectrum disorder, disordered sleep and gait disorder. She also has asthma and history of frequent urinary tract infections.  Today's concerns: Mom reports today that Cinthia has been having frequent seizures for the last month or so. Mom estimates that Anastassia has a seizure almost every day in which her head nods, her eyes roll back and she stiffens her body. If she is lying down, she sits upright and has these behaviors. Mom says that she is unable to gain Lissa's attention during these events but that they are brief, typically lasting 30 seconds or less, but that she will sometimes do this behavior repeatedly for 30 minutes or more. Mom says that Ketara is confused, then sleepy and tired after the events. Mom says that Cleo does not miss medication doses. There are ongoing problems with insufficient sleep.   Mom reports that Gwyndolyn continues to have frequent urinary tract infections but that her problems with asthma have improved somewhat. Annisha's behavior is self directed and Mom notes that sometimes she will strike out at her mother, particularly if she is tired or not feeling well.   Mom is concerned today about a rash on Adriene's inner thighs. She has tried home remedies without improvement. Mom said that she was recently prescribed Nystatin powder for the rash but that it has not helped yet.   Shalayah has been otherwise generally healthy since she was last seen. Mom has no other health concerns for [facility] today other than previously mentioned.  Review of systems: Please see HPI for neurologic and other  pertinent review of systems. Otherwise all other systems were reviewed and were negative.  Problem List: Patient Active Problem List   Diagnosis Date Noted   Renal cyst 11/29/2019   Intertrigo 11/29/2019   Complex care coordination 05/18/2019   Screening for cervical cancer 02/26/2019   Glucosuria 06/01/2018   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   Not well controlled moderate persistent asthma 04/29/2018   Gastroesophageal reflux disease 04/29/2018   Recurrent infections 04/29/2018   Dysuria 02/20/2018   Chronic rhinitis 04/03/2017   Recurrent falls 03/21/2017   Weakness 03/21/2017   Venous insufficiency 03/21/2017   OSA on CPAP 09/30/2016   Excessive daytime sleepiness 03/05/2016   Bronchitis 05/05/2015   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 if anesthesia did take antibiotic   Frequency-urgency syndrome    Gait disorder    ORGANIC  PER NERUOLGIST NOTE (DR HICKLING)   Generalized convulsive epilepsy (HCC) NEUROLOGIST-  DR HICKLING   GERD (gastroesophageal reflux disease)    Gout    H/O sinus tachycardia    History of acute respiratory failure 02/14/2015   SECONDARY TO CAP   History of recurrent UTIs    Incontinent of feces    Interstitial cystitis    Intertrigo 11/29/2019   Moderate intellectual disability    OSA (obstructive sleep apnea)    no machine yet    Partial epilepsy with impairment of consciousness (HCC)    FOLLOWED BY DR HICKLING   Pneumonia 2    4 times this year ( 2020)   PONV (postoperative nausea and vomiting)    Renal cyst 11/29/2019   Seizure (HCC)    most recent sz " at the end of summer"-last sz years ago per mother   Urinary incontinence     Past medical history comments: See HPI Copied from previous record: EEG on September 14, 2011, was normal. She was able to be successfully weaned off Keppra. The patient has significant issues with aggression, which was thought to be related to Keppra, but has persisted once it was discontinued. The patient was treated with Depakote both for behavior and possible seizures. She had palpitations and severe constipation on Seroquel. She had marked weight gain on Abilify and Depakote. When generic Divalproex was tried she had breakthrough seizures.   Nocturnal polysomnogram on October 27, 2011, failed to show significant periodic limb movements, cyanosis, sleep apnea, or cardiac arrhythmia. EEG on February 20, 2012, was a normal study with the patient awake.   EEG performed March 25, 2017 was normal  EEG performed May 21, 2018 was normal  Surgical history: Past Surgical History:  Procedure Laterality Date   CYSTOSCOPY N/A 06/03/2017   Procedure: CYSTOSCOPY WITH EXAM UNDER ANESTHESIA;  Surgeon: Jerilee Field, MD;  Location: [facility];  Service: Urology;  Laterality: N/A;   CYSTOSCOPY  2020   CYSTOSCOPY WITH  HYDRODISTENSION AND BIOPSY  04/14/2012   Procedure: CYSTOSCOPY/BIOPSY/HYDRODISTENSION;  Surgeon: Lindaann Slough, MD;  Location: [facility];  Service: Urology;;  instillation of marcaine and pyridium   EUA/  PAP SMEAR/  BREAST EXAM  03-12-2016   dr Erin Fulling [facility]   EUA/ VAGINOSCOPY/ COLPOSCOPY FO THE HYMENAL RING  10-04-2009    dr Tamela Oddi  [facility]   IR RADIOLOGIST EVAL & MGMT  07/29/2019   IR RADIOLOGIST EVAL & MGMT  09/30/2019   IR SCLEROTHERAPY OF A FLUID COLLECTION  09/08/2019   RADIOLOGY WITH ANESTHESIA N/A 09/08/2019   Procedure: IR Sclerotherapy Treatment;  Surgeon: Radiologist, Medication, MD;  Location: [facility] OR;  Service: Radiology;  Laterality: N/A;   TONSILLECTOMY       Family history: family history includes Pancreatic cancer in her sister; Seizures in her mother and sister.   Social history: Social History   Socioeconomic History   Marital status: Single    Spouse name: Not on file   Number of children: Not on file   Years of education: Not on file   Highest education level: Not on file  Occupational History   Not on file  Tobacco Use   Smoking status: Never Smoker  Smokeless tobacco: Never Used  Vaping Use   Vaping Use: Never used  Substance and Sexual Activity   Alcohol use: No    Alcohol/week: 0.0 standard drinks   Drug use: No   Sexual activity: Never    Birth control/protection: Pill  Other Topics Concern   Not on file  Social History Narrative   Lives with mom ([name] Vanhook) and half-sister Juanetta Negash) who is also mentally impaired.  Has CAP assistance, urinary incontinence, needs help with feeding,dressing, toileting   Graduated from [employer].  She enjoys playing with cars, dancing, and dogs.   Social Determinants of Health   Financial Resource Strain:    Difficulty of Paying Living Expenses: Not on file  Food Insecurity:    Worried About Programme researcher, broadcasting/film/video in the Last Year: Not on file   [employer] of Food in the Last Year: Not on file  Transportation Needs:    Lack of Transportation (Medical): Not on file   Lack of Transportation (Non-Medical): Not on file  Physical Activity:    Days of Exercise per Week: Not on file   Minutes of Exercise per Session: Not on file  Stress:    Feeling of Stress : Not on file  Social Connections:    Frequency of Communication with Friends and Family: Not on file   Frequency of Social Gatherings with Friends and Family: Not on file   Attends Religious Services: Not on file   Active Member of Clubs or Organizations: Not on file   Attends Banker Meetings: Not on file   Marital Status: Not on file  Intimate Partner Violence:    Fear of Current or Ex-Partner: Not on file   Emotionally Abused: Not on file   Physically Abused: Not on file   Sexually Abused: Not on file    Past/failed meds: Levetiracetam - aggressive behavior  Allergies: Allergies  Allergen Reactions   Abilify [Aripiprazole] Swelling and Palpitations   Seroquel [Quetiapine Fumarate] Palpitations   Amoxicillin-Pot Clavulanate Hives and Other (See Comments)    Has patient had a PCN reaction causing immediate rash, facial/tongue/throat swelling, SOB or lightheadedness with hypotension: No Has patient had a PCN reaction causing severe rash involving mucus membranes or skin necrosis:    #  #  YES  #  #  Has patient had a PCN reaction that required hospitalization: No Has patient had a PCN reaction occurring within the last 10 years: No    Bactrim [Sulfamethoxazole-Trimethoprim] Other (See Comments)    Abdominal pain   Doxycycline Other (See Comments)    Stomach cramps   Macrobid [Nitrofurantoin Macrocrystal] Other (See Comments)    Sharp pain/ given with Tylenol 325 mg   Keppra [Levetiracetam] Other (See Comments)    Aggressive behavior     Immunizations: Immunization History  Administered Date(s) Administered   Pneumococcal  Polysaccharide-23 08/06/2016   Tdap 03/03/2017    Diagnostics/Screenings: 03/27/17 - CT head - normal  Physical Exam: BP 112/74    Pulse (!) 104    Ht  (1.473 m)    Wt 181 lb 9.6 oz (82.4 kg)    BMI 37.95 kg/m   General: well developed, well nourished, obese young woman, seated in exam room, in no evident distress; black hair, brown eyes, right handed Head: normocephalic and atraumatic. Oropharynx benign. No dysmorphic features. Neck: supple Cardiovascular: regular rate and rhythm, no murmurs. Respiratory: clear to auscultation bilaterally Abdomen: bowel sounds present all four quadrants, abdomen soft,  non-tender, non-distended. Musculoskeletal: no skeletal deformities or obvious scoliosis Skin: no rashes or neurocutaneous lesions  Neurologic Exam Mental Status: awake and fully alert. Limited language. Variable eye contact. Occasionally attempts to hug me. Unable to follow simple commands. Needs frequent redirection. Tolerant of invasions into her space. Makes grimacing face and whispers the word "bitch" frequently. Cranial Nerves: fundoscopic exam - red reflex present.  Unable to fully visualize fundus.  Pupils equal briskly reactive to light.  Turns to localize faces and objects in the periphery. Turns to localize sounds in the periphery. Facial movements are symmetric Motor: normal functional bulk, tone and strength Sensory: withdrawal x 4 Coordination: unable to adequately assess due to patient's inability to participate in examination. No dysmetria when reaching for objects Gait and Station: able to stand and bear weight. The left toe turns in when walking. Balance is fair, does best with person assistance. Reflexes: diminished and symmetric. Toes neutral. No clonus  Impression: 1. Partial epilepsy with impairment of consciousness 2. Generalized convulsive epilepsy 3. Intellectual disability 4. Insomnia and sleep arousals 5. Gait disorder 6. Intermittent tremors 7.  Autism spectrum disorder 8. At risk for falls  Recommendations for plan of care: The patient's previous [facility] records were reviewed. Selen has neither had nor required imaging or lab studies since the last visit, other than what was performed by her other providers. She is a 24 year old young woman with history of epilepsy, intellectual disability, insomnia and sleep arousals, gait disorder, autism, asthma and frequent urinary tract infections. Mom reports frequent seizure events despite compliance with Depakote. I recommended checking a Depakote level and performing an EEG. We may need to consider a 48 hour ambulatory EEG at home but the family is living with a maternal aunt and Mom wants to wait until they are in their own apartment later this year to do this procedure.   For the rash on her inner thighs, I recommended trial of Nystatin cream. I explained to Mom to apply thin layer on the rash, and once absorbed to apply the powder that she has at home.   I will see Nikkia back in follow up in 1 month to review these lab and EEG results. Mom agreed with the plans made today.   The medication list was reviewed and reconciled. I reviewed changes that were made in the prescribed medications today. A complete medication list was provided to the patient.  Allergies as of 11/30/2019      Reactions   Abilify [aripiprazole] Swelling, Palpitations   Seroquel [quetiapine Fumarate] Palpitations   Amoxicillin-pot Clavulanate Hives, Other (See Comments)   Has patient had a PCN reaction causing immediate rash, facial/tongue/throat swelling, SOB or lightheadedness with hypotension: No Has patient had a PCN reaction causing severe rash involving mucus membranes or skin necrosis:    #  #  YES  #  #  Has patient had a PCN reaction that required hospitalization: No Has patient had a PCN reaction occurring within the last 10 years: No   Bactrim [sulfamethoxazole-trimethoprim] Other (See Comments)   Abdominal  pain   Doxycycline Other (See Comments)   Stomach cramps   Macrobid [nitrofurantoin Macrocrystal] Other (See Comments)   Sharp pain/ given with Tylenol 325 mg   Keppra [levetiracetam] Other (See Comments)   Aggressive behavior       Medication List       Accurate as of November 30, 2019 11:59 PM. If you have any questions, ask your nurse or doctor.  acetaminophen 325 MG tablet Commonly known as: TYLENOL Take 2 tablets (650 mg total) by mouth every 6 (six) hours as needed for mild pain or moderate pain.   albuterol (2.5 MG/3ML) 0.083% nebulizer solution Commonly known as: PROVENTIL USE 1 VIAL VIA NEBULIZER EVERY 4 TO 6 HOURS AS NEEDED FOR COUGH   benzonatate 200 MG capsule Commonly known as: TESSALON TAKE 1 CAPSULE(200 MG) BY MOUTH THREE TIMES DAILY AS NEEDED   budesonide 1 MG/2ML nebulizer solution Commonly known as: PULMICORT USE 1 VIAL VIA NEBULIZER FOUR TIMES DAILY DURING RESPIRATORY INFECTIONS AND ASTHMA FLARES FOR 1-2 WEEKS   cephALEXin 500 MG capsule Commonly known as: KEFLEX Take 500 mg by mouth 3 (three) times daily.   cetirizine 10 MG tablet Commonly known as: ZYRTEC TAKE 1 TABLET(10 MG) BY MOUTH DAILY What changed: See the new instructions.   clotrimazole-betamethasone cream Commonly known as: LOTRISONE Apply to affected area 2 times daily prn   Depakote 500 MG DR tablet Generic drug: divalproex TAKE 1 TABLET BY MOUTH IN THE MORNING AND 2 AT NIGHT What changed:   how much to take  how to take this  when to take this  additional instructions   famotidine 20 MG tablet Commonly known as: PEPCID Take 1 tablet (20 mg total) by mouth every 12 (twelve) hours.   fluconazole 150 MG tablet Commonly known as: DIFLUCAN Take 150 mg by mouth as needed (when on antibiotics).   fluticasone 50 MCG/ACT nasal spray Commonly known as: Flonase Place 1 spray into both nostrils daily.   fosfomycin 3 g Pack Commonly known as: MONUROL Take 3 grams by  mouth  every 3 DAYS for 4 doses   hydrOXYzine 25 MG tablet Commonly known as: ATARAX/VISTARIL Take 25 mg by mouth 3 (three) times daily as needed (pain).   Levonorgestrel-Ethinyl Estradiol 0.15-0.03 &0.01 MG tablet Commonly known as: Seasonique Take 1 tablet by mouth daily.   melatonin 3 MG Tabs tablet Take 3 mg by mouth at bedtime.   mometasone 50 MCG/ACT nasal spray Commonly known as: NASONEX Use one spray in each nostril once daily as directed.   montelukast 10 MG tablet Commonly known as: SINGULAIR TAKE 1 TABLET(10 MG) BY MOUTH AT BEDTIME What changed: See the new instructions.   Nebulizer Misc Adult mask with tubing. Use as directed with nebulizer device.   nystatin powder Commonly known as: MYCOSTATIN/NYSTOP Apply 1 application topically 3 (three) times daily. What changed: Another medication with the same name was added. Make sure you understand how and when to take each. Changed by: Elveria Risingina Jakari Jacot, NP   nystatin cream Commonly known as: MYCOSTATIN Apply 1 application topically 2 (two) times daily. Apply to rash 2 times per day for 2 weeks What changed: You were already taking a medication with the same name, and this prescription was added. Make sure you understand how and when to take each. Changed by: Elveria Risingina Yanni Ruberg, NP   ondansetron 4 MG disintegrating tablet Commonly known as: Zofran ODT Take 1 tablet (4 mg total) by mouth every 8 (eight) hours as needed for nausea. What changed: how much to take   Perforomist 20 MCG/2ML nebulizer solution Generic drug: formoterol USE 2 MLS BY NEBULIZATION TWICE DAILY What changed: See the new instructions.   polyethylene glycol powder 17 GM/SCOOP powder Commonly known as: GLYCOLAX/MIRALAX Dissolve 17 grams (1 capful) in at least 8 ounces of water/juice and drink twice daily What changed:   how much to take  how to take this  when to  take this   promethazine 25 MG tablet Commonly known as: PHENERGAN Take 1  tablet (25 mg total) by mouth every 6 (six) hours as needed for nausea or vomiting.   senna 8.6 MG tablet Commonly known as: SENOKOT Take 1 tablet by mouth at bedtime.   terconazole 0.4 % vaginal cream Commonly known as: TERAZOL 7 Place 1 applicator vaginally at bedtime. What changed:   when to take this  reasons to take this   traZODone 50 MG tablet Commonly known as: DESYREL TAKE 2 TABLETS(100 MG) BY MOUTH AT BEDTIME        Total time spent with the patient was 30 minutes, of which 50% or more was spent in counseling and coordination of care.  Elveria Rising NP-C [facility] Ph. [phone] Fax [phone]

## 2019-11-30 NOTE — Patient Instructions (Signed)
Thank you for coming in today.   Instructions for you until your next appointment are as follows: 1. I have given you a lab order to get Elfreda's Depakote level checked. The blood should be drawn in the morning before the first dose of medication of the day. This can be done at the [facility], [address], any morning except Thursday. The blood can be drawn at this office on Thursday mornings. The lab opens 8:15AM.  2. I have ordered an EEG for [facility] as we discussed. You will receive a call about scheduling it.  3. I sent in a prescription for Nystatin for Shamia's rash. Apply a small amount to the rash twice per day. When the cream has been absorbed, you can apply a thin coat of the powder to the skin as well.  4. Please plan to return for follow up in 1 month or sooner if needed.

## 2019-12-01 NOTE — Telephone Encounter (Signed)
[name]  was advised per Dr Daiva Eves the urine culture has not completed and the criteria for treating a UTI was not present.   Advised she wait on culture.   Mother states her daughter is "pulling on herself" and there was blood in her pull up when she was in the office. Mother stated she knows when something is going on and would like to treat this before it turns into pneumonia like before. She will probably take her to see a different provider if she is not treated.  I informed Mom it was her right to take her daughter anywhere she felt comfortable and our office would call one the culture has completed.   Laurell Josephs, RN

## 2019-12-02 ENCOUNTER — Other Ambulatory Visit: Payer: Self-pay | Admitting: Allergy and Immunology

## 2019-12-02 ENCOUNTER — Encounter (INDEPENDENT_AMBULATORY_CARE_PROVIDER_SITE_OTHER): Payer: Self-pay | Admitting: Family

## 2019-12-02 MED ORDER — DEPAKOTE 500 MG PO TBEC: DELAYED_RELEASE_TABLET | ORAL | 5 refills | Status: DC

## 2019-12-02 MED ORDER — TRAZODONE HCL 50 MG PO TABS: ORAL_TABLET | ORAL | 5 refills | Status: DC

## 2019-12-05 ENCOUNTER — Telehealth: Payer: Self-pay | Admitting: Infectious Disease

## 2019-12-05 NOTE — Telephone Encounter (Signed)
I received a phone call from Britainy's mother today.  She is convinced that Nadiah has a urinary tract infection    When I saw Jood in the clinic she had some nonspecific symptoms that could be consistent with a urinary tract infection  HOWEVER SHE DID NOT HAVE PYURIA  Despite the fact that this formula of symptoms of urinary tract infection plus pyuria equals a possible urinary tract infection has been taught in medical school for decades I realize there are some physicians who seem to forget this.  That being said it said this remains the standard for diagnosing a urinary tract infection.  If a person has symptoms but no pyuria they DO NOT HAVE A UTI  Mother called me today still convinced the patient is having urinary tract infection and stating that she was in fact having fevers now.  She also is having some sinus congestion and what mom thinks is an asthmatic flare.  Due to the respiratory symptoms she needs to have a COVID-19 test before she could be seen in clinic.  Mom is convinced the patient does not have COVID-19 but regardless she has to have a test for this before she can be seen in our clinic as we are not equipped to see Covid positive patients or possible Covid positive patients.  At seems that she would likely bring her daughter to the emergency department.

## 2019-12-06 ENCOUNTER — Telehealth: Payer: Self-pay

## 2019-12-06 LAB — URINALYSIS, ROUTINE W REFLEX MICROSCOPIC
Bilirubin Urine: NEGATIVE
Glucose, UA: NEGATIVE
Hgb urine dipstick: NEGATIVE
Leukocytes,Ua: NEGATIVE
Nitrite: NEGATIVE
Protein, ur: NEGATIVE
Specific Gravity, Urine: 1.024 (ref 1.001–1.03)
pH: 6.5 (ref 5.0–8.0)

## 2019-12-06 LAB — CBC WITH DIFFERENTIAL/PLATELET
Absolute Monocytes: 1074 cells/uL — ABNORMAL HIGH (ref 200–950)
Basophils Absolute: 45 cells/uL (ref 0–200)
Basophils Relative: 0.4 %
Eosinophils Absolute: 23 cells/uL (ref 15–500)
Eosinophils Relative: 0.2 %
HCT: 36.3 % (ref 35.0–45.0)
Hemoglobin: 12 g/dL (ref 11.7–15.5)
Lymphs Abs: 4079 cells/uL — ABNORMAL HIGH (ref 850–3900)
MCH: 30.1 pg (ref 27.0–33.0)
MCHC: 33.1 g/dL (ref 32.0–36.0)
MCV: 91 fL (ref 80.0–100.0)
MPV: 10.5 fL (ref 7.5–12.5)
Monocytes Relative: 9.5 %
Neutro Abs: 6079 cells/uL (ref 1500–7800)
Neutrophils Relative %: 53.8 %
Platelets: 219 10*3/uL (ref 140–400)
RBC: 3.99 10*6/uL (ref 3.80–5.10)
RDW: 13.8 % (ref 11.0–15.0)
Total Lymphocyte: 36.1 %
WBC: 11.3 10*3/uL — ABNORMAL HIGH (ref 3.8–10.8)

## 2019-12-06 LAB — BASIC METABOLIC PANEL WITH GFR
BUN: 16 mg/dL (ref 7–25)
CO2: 24 mmol/L (ref 20–32)
Calcium: 9.4 mg/dL (ref 8.6–10.2)
Chloride: 106 mmol/L (ref 98–110)
Creat: 0.72 mg/dL (ref 0.50–1.10)
GFR, Est African American: 135 mL/min/{1.73_m2} (ref >=60)
GFR, Est Non African American: 116 mL/min/{1.73_m2} (ref >=60)
Glucose, Bld: 105 mg/dL — ABNORMAL HIGH (ref 65–99)
Potassium: 4.4 mmol/L (ref 3.5–5.3)
Sodium: 137 mmol/L (ref 135–146)

## 2019-12-06 LAB — URINE CULTURE
MICRO NUMBER:: 10945253
SPECIMEN QUALITY:: ADEQUATE

## 2019-12-06 NOTE — Telephone Encounter (Signed)
She does not meet criteria for UTI without pyuria. I do treat bcteruria in non pregnant pts not unegoung urologic procedures I reviewed cuktures and no role for fosfomycin or other antibiotics

## 2019-12-06 NOTE — Telephone Encounter (Signed)
Patient's mother called and would like to know if you have reviewed the patient's urine culture? She is also requesting a prescription for fosfomycin Angelene Rome T Pricilla Loveless

## 2019-12-07 NOTE — Telephone Encounter (Signed)
Spoke with patient's mother and advised her that per Dr. Daiva Eves patient does not meet the criteria for a an antibiotic. Patient's mother still requesting antibiotic. She requested to have message sent to Dr. Drue Second. She was advised Dr. Drue Second in not in the office at this time and she is no longer under Dr. Feliz Beam care. Patient's mother requested to speak to [name], RN about the situation.  Lori Miles Lori Miles

## 2019-12-10 ENCOUNTER — Other Ambulatory Visit: Payer: Self-pay | Admitting: Allergy and Immunology

## 2019-12-11 LAB — VALPROIC ACID LEVEL: Valproic Acid Lvl: 101.8 mg/L — ABNORMAL HIGH (ref 50.0–100.0)

## 2019-12-14 ENCOUNTER — Telehealth: Payer: Self-pay

## 2019-12-14 ENCOUNTER — Telehealth (INDEPENDENT_AMBULATORY_CARE_PROVIDER_SITE_OTHER): Payer: Self-pay | Admitting: Family

## 2019-12-14 NOTE — Telephone Encounter (Signed)
Spoke with patient's mother regarding U/A C&S results obtained at Urgent Care. Mother states she will provide [facility] a copy to review.  Valarie Cones

## 2019-12-14 NOTE — Telephone Encounter (Signed)
Patient's mother called office stating patient recently visited urgent care and tested pos for UTI. Is requesting refills on Fosfomycin 3 g pack. Would also like call from DR. Snider to discuss symptoms patient is experiencing. Lorenso Courier, [place]

## 2019-12-14 NOTE — Telephone Encounter (Signed)
Who's calling (name and relationship to patient) : Lori Miles mom  Best contact number: [phone]  Provider they see: Elveria Rising  Reason for call: Requesting a call back from Elveria Rising   Call ID:      PRESCRIPTION REFILL ONLY  Name of prescription:  Pharmacy:

## 2019-12-15 ENCOUNTER — Other Ambulatory Visit: Payer: Self-pay

## 2019-12-15 ENCOUNTER — Ambulatory Visit (HOSPITAL_COMMUNITY)
Admission: RE | Admit: 2019-12-15 | Discharge: 2019-12-15 | Disposition: A | Discharge status: Home / Self Care | Payer: Medicare Other | Source: Ambulatory Visit | Attending: Neurology | Admitting: Neurology

## 2019-12-15 DIAGNOSIS — G40209 Localization-related (focal) (partial) symptomatic epilepsy and epileptic syndromes with complex partial seizures, not intractable, without status epilepticus: Secondary | ICD-10-CM | POA: Diagnosis not present

## 2019-12-15 DIAGNOSIS — G40309 Generalized idiopathic epilepsy and epileptic syndromes, not intractable, without status epilepticus: Secondary | ICD-10-CM | POA: Diagnosis not present

## 2019-12-15 DIAGNOSIS — R569 Unspecified convulsions: Secondary | ICD-10-CM

## 2019-12-15 DIAGNOSIS — Z79899 Other long term (current) drug therapy: Secondary | ICD-10-CM | POA: Diagnosis not present

## 2019-12-15 NOTE — Progress Notes (Signed)
EEG complete - results pending 

## 2019-12-15 NOTE — Telephone Encounter (Signed)
I called and spoke to Mom. She wanted to let me know that Lori Miles has another UTI, which may account for the increased seizure activity. TG

## 2019-12-15 NOTE — Procedures (Signed)
Patient:  Lori Miles   Sex: female  DOB:  Jan 29, 1996  Date of study:    12/15/2019              Clinical history: This is a 24 year old female with history of seizure disorder who has been having frequent episodes of clinical seizure activity over the past month concerning for true epileptic event.  These episodes described as rolling of the eyes and stiffening of her body that may last for around 30 seconds or less.  These episodes may happen repeatedly for several minutes.  EEG was done to evaluate for possible epileptic event.  Medication: Depakote, famotidine, melatonin, hydroxyzine          Procedure: The tracing was carried out on a 32 channel digital Cadwell recorder reformatted into 16 channel montages with 1 devoted to EKG.  The 10 /20 international system electrode placement was used. Recording was done during awake, drowsiness and sleep states. Recording time 34.5 minutes.   Description of findings: Background rhythm consists of amplitude of 30 microvolt and frequency of 8-9 hertz posterior dominant rhythm. There was normal anterior posterior gradient noted. Background was well organized, continuous and symmetric with no focal slowing. There was muscle artifact noted.  There were also frequent blinking artifacts and pulse artifacts noted. During brief period of drowsiness and sleep there was gradual decrease in background frequency noted. During the early stages of sleep there were symmetrical sleep spindles and vertex sharp waves noted.  Hyperventilation resulted in slowing of the background activity. Photic stimulation using stepwise increase in photic frequency resulted in bilateral symmetric driving response. Throughout the recording there were no focal or generalized epileptiform activities in the form of spikes or sharps noted. There were no transient rhythmic activities or electrographic seizures noted. One lead EKG rhythm strip revealed sinus rhythm at a rate of 90  bpm.  Impression: This EEG is normal during awake and asleep states. Please note that normal EEG does not exclude epilepsy, clinical correlation is indicated.      Keturah Shavers, MD

## 2019-12-16 ENCOUNTER — Telehealth (INDEPENDENT_AMBULATORY_CARE_PROVIDER_SITE_OTHER): Payer: Self-pay | Admitting: Family

## 2019-12-16 NOTE — Telephone Encounter (Signed)
Dr Nab read the EEG as normal. I called Mom to let her know. I told her that if Lori Miles continues to have frequent spells that we need to arrange the ambulatory EEG at home. Mom agreed with this plan. TG

## 2019-12-27 ENCOUNTER — Other Ambulatory Visit (INDEPENDENT_AMBULATORY_CARE_PROVIDER_SITE_OTHER): Payer: Self-pay | Admitting: Family

## 2019-12-27 DIAGNOSIS — R21 Rash and other nonspecific skin eruption: Secondary | ICD-10-CM

## 2019-12-31 ENCOUNTER — Telehealth: Payer: Self-pay

## 2019-12-31 NOTE — Telephone Encounter (Signed)
Patients mother keeps calling about the patient's Urine culture and feels the patient has a UTI. Patient's mother has contacted the urologist and states she was advised to call our office. Patient's mother would like the patient to be restarted on fosfomycin by Dr. Drue Second. Patient's mother states she is getting frustrated.  Please advise. Lanie Schelling T Pricilla Loveless

## 2020-01-01 ENCOUNTER — Other Ambulatory Visit: Payer: Self-pay | Admitting: Internal Medicine

## 2020-01-10 ENCOUNTER — Other Ambulatory Visit: Payer: Self-pay | Admitting: Allergy and Immunology

## 2020-01-12 ENCOUNTER — Other Ambulatory Visit: Payer: Self-pay | Admitting: *Deleted

## 2020-01-12 NOTE — Telephone Encounter (Signed)
Mother states she will try to drop off sample tomorrow or Monday before she is seen on 11/2 with Dr. Daiva Eves.

## 2020-01-12 NOTE — Telephone Encounter (Signed)
Can the mother/ daughter, come to drop off urine specimen.

## 2020-01-12 NOTE — Progress Notes (Signed)
Error

## 2020-01-12 NOTE — Telephone Encounter (Signed)
Can someone please contact patient's mother

## 2020-01-13 ENCOUNTER — Ambulatory Visit: Payer: Medicare Other | Admitting: Infectious Disease

## 2020-01-14 ENCOUNTER — Other Ambulatory Visit: Payer: Self-pay

## 2020-01-14 ENCOUNTER — Other Ambulatory Visit: Payer: Medicare Other

## 2020-01-14 DIAGNOSIS — N390 Urinary tract infection, site not specified: Secondary | ICD-10-CM

## 2020-01-15 ENCOUNTER — Emergency Department (HOSPITAL_COMMUNITY): Payer: Medicare Other

## 2020-01-15 ENCOUNTER — Emergency Department (HOSPITAL_COMMUNITY)
Admission: EM | Admit: 2020-01-15 | Discharge: 2020-01-15 | Disposition: A | Discharge status: Home / Self Care | Payer: Medicare Other | Attending: Emergency Medicine | Admitting: Emergency Medicine

## 2020-01-15 ENCOUNTER — Encounter (HOSPITAL_COMMUNITY): Payer: Self-pay | Admitting: *Deleted

## 2020-01-15 DIAGNOSIS — Z7951 Long term (current) use of inhaled steroids: Secondary | ICD-10-CM | POA: Diagnosis not present

## 2020-01-15 DIAGNOSIS — J189 Pneumonia, unspecified organism: Secondary | ICD-10-CM | POA: Diagnosis not present

## 2020-01-15 DIAGNOSIS — R000 Tachycardia, unspecified: Secondary | ICD-10-CM | POA: Diagnosis not present

## 2020-01-15 DIAGNOSIS — R1110 Vomiting, unspecified: Secondary | ICD-10-CM | POA: Diagnosis not present

## 2020-01-15 DIAGNOSIS — R3981 Functional urinary incontinence: Secondary | ICD-10-CM | POA: Insufficient documentation

## 2020-01-15 DIAGNOSIS — J449 Chronic obstructive pulmonary disease, unspecified: Secondary | ICD-10-CM | POA: Insufficient documentation

## 2020-01-15 DIAGNOSIS — R059 Cough, unspecified: Secondary | ICD-10-CM | POA: Diagnosis present

## 2020-01-15 LAB — URINALYSIS, ROUTINE W REFLEX MICROSCOPIC
Bacteria, UA: NONE SEEN /HPF
Bilirubin Urine: NEGATIVE
Bilirubin Urine: NEGATIVE
Glucose, UA: NEGATIVE
Glucose, UA: NEGATIVE mg/dL
Hgb urine dipstick: NEGATIVE
Hyaline Cast: NONE SEEN /LPF
Ketones, ur: NEGATIVE mg/dL
Leukocytes,Ua: NEGATIVE
Nitrite: NEGATIVE
Nitrite: NEGATIVE
Protein, ur: NEGATIVE
Specific Gravity, Urine: 1.022 (ref 1.001–1.03)
Specific Gravity, Urine: 1.01 (ref 1.005–1.030)
pH: 5.5 (ref 5.0–8.0)
pH: 8 (ref 5.0–8.0)

## 2020-01-15 LAB — URINALYSIS, MICROSCOPIC (REFLEX): Bacteria, UA: NONE SEEN

## 2020-01-15 LAB — CBC WITH DIFFERENTIAL/PLATELET
Abs Immature Granulocytes: 0.08 10*3/uL — ABNORMAL HIGH (ref 0.00–0.07)
Basophils Absolute: 0 10*3/uL (ref 0.0–0.1)
Basophils Relative: 0 %
Eosinophils Absolute: 0.1 10*3/uL (ref 0.0–0.5)
Eosinophils Relative: 1 %
HCT: 37.2 % (ref 36.0–46.0)
Hemoglobin: 12.4 g/dL (ref 12.0–15.0)
Immature Granulocytes: 1 %
Lymphocytes Relative: 32 %
Lymphs Abs: 3 10*3/uL (ref 0.7–4.0)
MCH: 31.1 pg (ref 26.0–34.0)
MCHC: 33.3 g/dL (ref 30.0–36.0)
MCV: 93.2 fL (ref 80.0–100.0)
Monocytes Absolute: 0.7 10*3/uL (ref 0.1–1.0)
Monocytes Relative: 8 %
Neutro Abs: 5.5 10*3/uL (ref 1.7–7.7)
Neutrophils Relative %: 58 %
Platelets: 186 10*3/uL (ref 150–400)
RBC: 3.99 MIL/uL (ref 3.87–5.11)
RDW: 13.8 % (ref 11.5–15.5)
WBC: 9.3 10*3/uL (ref 4.0–10.5)
nRBC: 0 % (ref 0.0–0.2)

## 2020-01-15 LAB — COMPREHENSIVE METABOLIC PANEL
ALT: 22 U/L (ref 0–44)
AST: 24 U/L (ref 15–41)
Albumin: 3.2 g/dL — ABNORMAL LOW (ref 3.5–5.0)
Alkaline Phosphatase: 40 U/L (ref 38–126)
Anion gap: 8 (ref 5–15)
BUN: 9 mg/dL (ref 6–20)
CO2: 24 mmol/L (ref 22–32)
Calcium: 9 mg/dL (ref 8.9–10.3)
Chloride: 106 mmol/L (ref 98–111)
Creatinine, Ser: 0.95 mg/dL (ref 0.44–1.00)
GFR, Estimated: >60 mL/min (ref >60)
Glucose, Bld: 138 mg/dL — ABNORMAL HIGH (ref 70–99)
Potassium: 3.9 mmol/L (ref 3.5–5.1)
Sodium: 138 mmol/L (ref 135–145)
Total Bilirubin: 0.2 mg/dL — ABNORMAL LOW (ref 0.3–1.2)
Total Protein: 6.7 g/dL (ref 6.5–8.1)

## 2020-01-15 LAB — I-STAT BETA HCG BLOOD, ED (MC, WL, AP ONLY): I-stat hCG, quantitative: <5 m[IU]/mL (ref <5)

## 2020-01-15 MED ORDER — BENZONATATE 100 MG PO CAPS: 100.0000 mg | ORAL_CAPSULE | Freq: Three times a day (TID) | ORAL | 0 refills | Status: AC

## 2020-01-15 MED ORDER — LEVOFLOXACIN IN D5W 750 MG/150ML IV SOLN
750.0000 mg | Freq: Once | INTRAVENOUS | Status: AC
  Administered 2020-01-15: 750 mg via INTRAVENOUS
  Filled 2020-01-15: qty 150

## 2020-01-15 MED ORDER — LEVOFLOXACIN 750 MG PO TABS: 750.0000 mg | ORAL_TABLET | Freq: Every day | ORAL | 0 refills | Status: AC

## 2020-01-15 NOTE — ED Provider Notes (Signed)
  Face-to-face evaluation   History: Mother brought child here today because of concern about UTI secondary to her feeling poorly and having blood with urination.  She was recently treated for Proteus UTI with Augmentin, with a primary and secondary dose regimen.  Patient has been doing well otherwise.  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  She does not appear to be in distress.  She is conversant and is asking to go home.  12:25 PM-I discussed the findings and plan with mother who understands the current treatment plan to include urine culture with change of antibiotics if needed for coverage for UTI.  At this time the patient does not have clinical signs and symptoms of UTI.  The culture was ordered, for information purposes to guide future treatment, not necessarily to initiate additional antibiotic treatment.  Note that the Levaquin was given today, primarily to treat pneumonia.  It would likely be prudent to reassess her, should the culture show a single organism urine growth.  Medical screening examination/treatment/procedure(s) were conducted as a shared visit with non-physician practitioner(s) and myself.  I personally evaluated the patient during the encounter    Mancel Bale, MD 01/15/20 [phone]

## 2020-01-15 NOTE — ED Triage Notes (Signed)
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 She has been seen and treated with Augmentin without success. Urine sent to lab yesterday with results in chart.

## 2020-01-15 NOTE — Discharge Instructions (Addendum)
You were given a prescription for antibiotics. Please take the antibiotic prescription fully.   Please follow up with your primary care provider within 3-5 days for re-evaluation of your symptoms. If you do not have a primary care provider, information for a healthcare clinic has been provided for you to make arrangements for follow up care. Please return to the emergency department for any new or worsening symptoms.

## 2020-01-15 NOTE — ED Notes (Signed)
PA at bedside.

## 2020-01-15 NOTE — ED Notes (Signed)
MD approved PO. Patient received crackers, peanut butter and Sprite at this time.

## 2020-01-15 NOTE — ED Provider Notes (Signed)
[facility]-EMERGENCY DEPT Provider Note   CSN: [id number] Arrival date & time: 01/15/20  0865     History Chief Complaint  Patient presents with  . Urinary Tract Infection    Lori Miles is a 24 y.o. female.  HPI   24 year old female with a history of asthma, autism, bacteriuria, chronic constipation, and cognitive developmental delay, constipation, GERD, sinus tachycardia, interstitial cystitis, urinary incontinence, who presents to the emergency department today for evaluation due to concern for possible UTI.   Due to patients h/o cognitive delay, pt unable to provide reliable hx. Level 5 caveat applies. Mother provides history.   Mother states that she feels the patient has a UTI.  She dropped off a urine sample to the infectious disease clinic yesterday.  She states that when the patient has a UTI she experiences nasal congestion and flaring of her asthma.  She states that she has had increased wheezing, cough.  She is also had some urinary incontinence and vomiting.  She has had no fevers at home.  States the patient has been on 2 rounds of Augmentin earlier this month but she is not sure when the patient last finished her Augmentin.   Past Medical History:  Diagnosis Date  . Asthma   . Autism    mom states no actual dx, but admits she has some symptoms  . Bacteriuria   . Chronic constipation   . Cognitive developmental delay   . Constipation   . Disruptive behavior disorder   . Dyspnea    if she walks much  . Family history of adverse reaction to anesthesia     "hives"-? if anesthesia did take antibiotic  . Frequency-urgency syndrome   . Gait disorder    ORGANIC PER NERUOLGIST NOTE (DR HICKLING)  . 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 GERD (gastroesophageal reflux disease)   . Gout   . H/O sinus tachycardia   . History of acute respiratory failure 02/14/2015   SECONDARY TO CAP  . History of recurrent UTIs    . Incontinent of feces   . Interstitial cystitis   . Intertrigo 11/29/2019  . Moderate intellectual disability   . OSA (obstructive sleep apnea)    no machine yet   . Partial epilepsy with impairment of consciousness (HCC)    FOLLOWED BY DR HICKLING  . Pneumonia 2    4 times this year ( 2020)  . PONV (postoperative nausea and vomiting)   . Renal cyst 11/29/2019  . Seizure (HCC)    most recent sz " at the end of summer"-last sz years ago per mother  . Urinary incontinence     Patient Active Problem List   Diagnosis Date Noted  . Renal cyst 11/29/2019  . Intertrigo 11/29/2019  . Complex care coordination 05/18/2019  . Screening for cervical cancer 02/26/2019  . Glucosuria 06/01/2018  . 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 Not well controlled moderate persistent asthma 04/29/2018  . Gastroesophageal reflux disease 04/29/2018  . Recurrent infections 04/29/2018  . Dysuria 02/20/2018  . Chronic rhinitis 04/03/2017  . Recurrent falls 03/21/2017  . Weakness 03/21/2017  . Venous insufficiency 03/21/2017  . OSA on CPAP 09/30/2016  . Excessive daytime sleepiness 03/05/2016  . Bronchitis 05/05/2015  . 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 Cough variant asthma vs UACS/ vcd  03/15/2015  . Autism spectrum disorder 02/14/2015  . Nausea with vomiting 02/14/2015  . Obesity, morbid (HCC) 12/13/2014  . Mental retardation, moderate (I.Q.  35-49) 12/13/2014  . 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 Acanthosis nigricans 11/14/2014  . Toeing-in 08/29/2014  . 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 Recurrent urinary tract infection 08/01/2011  . 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 Chronic constipation 08/01/2011  . Contraception 08/01/2011    Past Surgical History:  Procedure Laterality Date  . CYSTOSCOPY  N/A 06/03/2017   Procedure: CYSTOSCOPY WITH EXAM UNDER ANESTHESIA;  Surgeon: Jerilee FieldEskridge, [name], MD;  [facility] [place];  Service: Urology;  Laterality: N/A;  . CYSTOSCOPY  2020  . CYSTOSCOPY WITH HYDRODISTENSION AND BIOPSY  04/14/2012   Procedure: CYSTOSCOPY/BIOPSY/HYDRODISTENSION;  Surgeon: Lindaann SloughMarc-Henry Nesi, MD;  [facility] [place];  Service: Urology;;  instillation of marcaine and pyridium  . EUA/  PAP SMEAR/  BREAST EXAM  03-12-2016   dr [facility]  . EUA/ VAGINOSCOPY/ COLPOSCOPY FO THE HYMENAL RING  10-04-2009    [facility]  . IR RADIOLOGIST EVAL & MGMT  07/29/2019  . IR RADIOLOGIST EVAL & MGMT  09/30/2019  . IR SCLEROTHERAPY OF A FLUID COLLECTION  09/08/2019  . RADIOLOGY WITH ANESTHESIA N/A 09/08/2019   Procedure: IR Sclerotherapy Treatment;  Surgeon: Radiologist, Medication, MD;  Location: [facility] OR;  Service: Radiology;  Laterality: N/A;  . TONSILLECTOMY       OB History   No obstetric history on file.     Family History  Problem Relation Age of Onset  . Seizures Mother   . Seizures Sister        1 sister has  . Pancreatic cancer Sister   . Colon cancer Neg Hx   . Colon polyps Neg Hx   . Esophageal cancer Neg Hx   . Rectal cancer Neg Hx   . Stomach cancer Neg Hx     Social History   Tobacco Use  . Smoking status: Never Smoker  . Smokeless tobacco: Never Used  Vaping Use  . Vaping Use: Never used  Substance Use Topics  . Alcohol use: No    Alcohol/week: 0.0 standard drinks  . Drug use: No    Home Medications Prior to Admission medications   Medication Sig Start Date End Date Taking? Authorizing Provider  acetaminophen (TYLENOL) 325 MG tablet Take 2 tablets (650 mg total) by mouth every 6 (six) hours as needed for mild pain or moderate pain. Patient not taking: Reported on 11/30/2019 02/27/17   Everlene Farrieransie, William, PA-C  albuterol (PROVENTIL) (2.5 MG/3ML) 0.083% nebulizer solution USE 1 VIAL VIA NEBULIZER EVERY 4 TO 6 HOURS  AS NEEDED FOR COUGH 11/09/19   Alfonse SpruceGallagher, Joel Louis, MD  benzonatate (TESSALON) 100 MG capsule Take 1 capsule (100 mg total) by mouth every 8 (eight) hours for 5 days. 01/15/20 01/20/20  Donyell Carrell S, PA-C  budesonide (PULMICORT) 1 MG/2ML nebulizer solution USE 1 VIAL VIA NEBULIZER FOUR TIMES DAILY DURING RESPIRATORY INFECTIONS AND ASTHMA FLARES FOR 1-2 WEEKS 11/26/19   Ambs, Norvel RichardsAnne M, FNP  cetirizine (ZYRTEC) 10 MG tablet TAKE 1 TABLET(10 MG) BY MOUTH DAILY 12/02/19   Kozlow, Alvira PhilipsEric J, MD  clotrimazole-betamethasone (LOTRISONE) cream Apply to affected area 2 times daily prn Patient not taking: Reported on 11/29/2019 06/29/18   Elvina SidleLauenstein, Kurt, MD  DEPAKOTE 500 MG DR tablet TAKE 1 TABLET BY MOUTH IN THE MORNING AND 2 AT NIGHT 12/02/19   Elveria RisingGoodpasture, Tina, NP  famotidine (PEPCID) 20  MG tablet TAKE 1 TABLET(20 MG) BY MOUTH EVERY 12 HOURS 01/03/20   Pyrtle, Carie Caddy, MD  fluconazole (DIFLUCAN) 150 MG tablet Take 150 mg by mouth as needed (when on antibiotics).    [provider]  fluticasone (FLONASE) 50 MCG/ACT nasal spray Place 1 spray into both nostrils daily. Patient not taking: Reported on 11/30/2019 08/03/19   Alfonse Spruce, MD  formoterol (PERFOROMIST) 20 MCG/2ML nebulizer solution USE 2 MLS BY NEBULIZATION TWICE DAILY 01/10/20   Ambs, Norvel Richards, FNP  hydrOXYzine (ATARAX/VISTARIL) 25 MG tablet Take 25 mg by mouth 3 (three) times daily as needed (pain).  01/13/19   [provider]  levofloxacin (LEVAQUIN) 750 MG tablet Take 1 tablet (750 mg total) by mouth daily for 6 days. 01/15/20 01/21/20  Madalina Rosman S, PA-C  Levonorgestrel-Ethinyl Estradiol (SEASONIQUE) 0.15-0.03 &0.01 MG tablet Take 1 tablet by mouth daily. 01/28/19   Sharyon Cable, CNM  Melatonin 3 MG TABS Take 3 mg by mouth at bedtime.     [provider]  mometasone (NASONEX) 50 MCG/ACT nasal spray Use one spray in each nostril once daily as directed. 05/06/19   Kozlow, Alvira Philips, MD  montelukast (SINGULAIR) 10  MG tablet TAKE 1 TABLET(10 MG) BY MOUTH AT BEDTIME 12/02/19   Kozlow, Alvira Philips, MD  Nebulizer MISC Adult mask with tubing. Use as directed with nebulizer device. 11/18/18   Kozlow, Alvira Philips, MD  nystatin (MYCOSTATIN/NYSTOP) powder Apply 1 application topically 3 (three) times daily. 11/29/19   Randall Hiss, MD  nystatin cream (MYCOSTATIN) APPLY TOPICALLY TO RASH TWICE DAILY FOR 2 WEEKS 12/27/19   Elveria Rising, NP  ondansetron (ZOFRAN ODT) 4 MG disintegrating tablet Take 1 tablet (4 mg total) by mouth every 8 (eight) hours as needed for nausea. Patient taking differently: Take 8 mg by mouth every 8 (eight) hours as needed for nausea.  06/07/18   Garlon Hatchet, PA-C  polyethylene glycol powder (GLYCOLAX/MIRALAX) 17 GM/SCOOP powder Dissolve 17 grams (1 capful) in at least 8 ounces of water/juice and drink twice daily Patient taking differently: Take 17 g by mouth See admin instructions. Dissolve 17 grams (1 capful) in at least 8 ounces of water/juice and drink twice daily 02/15/19   Pyrtle, Carie Caddy, MD  promethazine (PHENERGAN) 25 MG tablet Take 1 tablet (25 mg total) by mouth every 6 (six) hours as needed for nausea or vomiting. 04/28/19   Melene Plan, DO  senna (SENOKOT) 8.6 MG tablet Take 1 tablet by mouth at bedtime.     [provider]  terconazole (TERAZOL 7) 0.4 % vaginal cream Place 1 applicator vaginally at bedtime. Patient taking differently: Place 1 applicator vaginally at bedtime as needed (yeast).  01/28/19   Sharyon Cable, CNM  traZODone (DESYREL) 50 MG tablet TAKE 2 TABLETS(100 MG) BY MOUTH AT BEDTIME 12/02/19   Elveria Rising, NP    Allergies    Abilify [aripiprazole], Seroquel [quetiapine fumarate], Amoxicillin-pot clavulanate, Bactrim [sulfamethoxazole-trimethoprim], Doxycycline, Macrobid [nitrofurantoin macrocrystal], and Keppra [levetiracetam]  Review of Systems   Review of Systems  Unable to perform ROS: Patient nonverbal  Constitutional: Negative for fever.   HENT: Positive for congestion and rhinorrhea.   Respiratory: Positive for cough and wheezing.   Gastrointestinal: Positive for nausea and vomiting.    Physical Exam Updated Vital Signs BP 108/68   Pulse (!) 102   Temp 98.4 F (36.9 C) (Rectal)   Resp 19   Ht 4' 10.8" (1.494 m)   Wt 73.5 kg  SpO2 98%   BMI 32.94 kg/m   Physical Exam Vitals and nursing note reviewed.  Constitutional:      General: She is not in acute distress.    Appearance: She is well-developed. She is not ill-appearing or toxic-appearing.     Comments: Smiling on exam  HENT:     Head: Normocephalic and atraumatic.     Mouth/Throat:     Mouth: Mucous membranes are moist.  Eyes:     Conjunctiva/sclera: Conjunctivae normal.  Cardiovascular:     Rate and Rhythm: Regular rhythm. Tachycardia present.     Heart sounds: Normal heart sounds. No murmur heard.   Pulmonary:     Effort: Pulmonary effort is normal. No respiratory distress.     Breath sounds: Wheezing present. No rhonchi or rales.  Abdominal:     General: Bowel sounds are normal.     Palpations: Abdomen is soft.     Tenderness: There is no abdominal tenderness. There is no guarding or rebound.  Musculoskeletal:     Cervical back: Neck supple.  Skin:    General: Skin is warm and dry.  Neurological:     Mental Status: She is alert.     ED Results / Procedures / Treatments   Labs (all labs ordered are listed, but only abnormal results are displayed) Labs Reviewed  CBC WITH DIFFERENTIAL/PLATELET - Abnormal; Notable for the following components:      Result Value   Abs Immature Granulocytes 0.08 (*)    All other components within normal limits  COMPREHENSIVE METABOLIC PANEL - Abnormal; Notable for the following components:   Glucose, Bld 138 (*)    Albumin 3.2 (*)    Total Bilirubin 0.2 (*)    All other components within normal limits  URINALYSIS, ROUTINE W REFLEX MICROSCOPIC - Abnormal; Notable for the following components:    APPearance CLOUDY (*)    Hgb urine dipstick LARGE (*)    Protein, ur TRACE (*)    All other components within normal limits  URINE CULTURE  URINALYSIS, MICROSCOPIC (REFLEX)  I-STAT BETA HCG BLOOD, ED ([facility], [facility], [facility] ONLY)    EKG None  Radiology DG Chest Portable 1 View  Result Date: 01/15/2020 CLINICAL DATA:  Cough. Chronic urinary tract infections. Patient typically gets pneumonia with urinary tract infection. History of asthma. EXAM: PORTABLE CHEST 1 VIEW COMPARISON:  05/17/2019 FINDINGS: Shallow lung inflation. Heart size is normal. There is minimal patchy density in the MEDIAL RIGHT lung base, possibly representing early infectious infiltrate. No pleural effusions. No pulmonary edema. IMPRESSION: Possible early RIGHT lower lobe infiltrate. Electronically Signed   By: Norva Pavlov M.D.   On: 01/15/2020 09:30    Procedures Procedures (including critical care time)  Medications Ordered in ED Medications  levofloxacin (LEVAQUIN) IVPB 750 mg (750 mg Intravenous New Bag/Given 01/15/20 1152)    ED Course  I have reviewed the triage vital signs and the nursing notes.  Pertinent labs & imaging results that were available during my care of the patient were reviewed by me and considered in my medical decision making (see chart for details).    MDM Rules/Calculators/A&P                          24 year old female presenting the emergency department today for evaluation of UTI.  She is also having some URI symptoms.  Reviewed/interpreted labs CBC is w/o leukocytosis or anemia CMP is unremarkable Beta-hCG wnl UA with hematuria, trace protein. There  are no leukocytes or nitrites. There are 6-10 RBCs, no WBCs and no bacteria seen. Urine culture sent.   I recommended the patient be tested for Covid given she is having upper respiratory symptoms however the patient's mother adamantly declines this exam stating that there is no way that her daughter could have Covid as she has not been  exposed and continues to refuse testing despite multiple attempts.   CXR reviewed/interpreted - possible early right lower lobe infiltrate   11:38 AM Discussed case with Dr. Daiva Eves with infectious disease. He agrees that the patient does not appear to have a UTI today on UA. He is in agreement with using levaquin to treat the patients pnuemonia.   I discussed findings and w/u with the patients mother at bedside. Discussed that UA today does not look infected but we can do a urine culture for further eval. It does look like she may be developing a pneumonia so we will treat her with a course of Levaquin for this. There are no signs of sepsis today so I do not feel she needs to be admitted. She states she is in contact with pts physician, Dr. Yetta Flock, who also plans to speak with Dr. Algis Liming. I advised to give levaquin at home. Will give cough medication. Advised clsoe f/u and strict return precautions. She voices understanding of the plan and reasons to return. All questions answered, pt stable for d/c.   Case discussed with supervising physician, Dr. Effie Shy who is in agreement with the plan.   Final Clinical Impression(s) / ED Diagnoses Final diagnoses:  Community acquired pneumonia of right lung, unspecified part of lung    Rx / DC Orders ED Discharge Orders         Ordered    levofloxacin (LEVAQUIN) 750 MG tablet  Daily        01/15/20 1223    benzonatate (TESSALON) 100 MG capsule  Every 8 hours        01/15/20 1223           Karrie Meres, PA-C 01/15/20 1224    Mancel Bale, MD 01/15/20 1657

## 2020-01-16 LAB — URINE CULTURE

## 2020-01-17 ENCOUNTER — Telehealth: Payer: Self-pay | Admitting: Pulmonary Disease

## 2020-01-18 ENCOUNTER — Ambulatory Visit: Payer: Medicare Other | Admitting: Infectious Disease

## 2020-01-18 NOTE — Telephone Encounter (Signed)
Spoke with pt's mother, [name]. Pt was recently in the hospital for PNA. [name] is calling to schedule pt a HFU appointment. This has been scheduled with [name] on 01/20/20 at 0930. Nothing further was needed.

## 2020-01-20 ENCOUNTER — Encounter: Payer: Self-pay | Admitting: *Deleted

## 2020-01-20 ENCOUNTER — Ambulatory Visit (INDEPENDENT_AMBULATORY_CARE_PROVIDER_SITE_OTHER): Payer: Medicare Other

## 2020-01-20 ENCOUNTER — Telehealth: Payer: Self-pay | Admitting: *Deleted

## 2020-01-20 ENCOUNTER — Ambulatory Visit (INDEPENDENT_AMBULATORY_CARE_PROVIDER_SITE_OTHER): Payer: Medicare Other | Admitting: Adult Health

## 2020-01-20 ENCOUNTER — Encounter: Payer: Self-pay | Admitting: Adult Health

## 2020-01-20 ENCOUNTER — Other Ambulatory Visit: Payer: Self-pay

## 2020-01-20 VITALS — BP 96/70 | HR 117 | Temp 97.5°F | Ht <= 58 in | Wt 160.0 lb

## 2020-01-20 DIAGNOSIS — J189 Pneumonia, unspecified organism: Secondary | ICD-10-CM

## 2020-01-20 DIAGNOSIS — G4719 Other hypersomnia: Secondary | ICD-10-CM

## 2020-01-20 DIAGNOSIS — J45991 Cough variant asthma: Secondary | ICD-10-CM | POA: Diagnosis not present

## 2020-01-20 DIAGNOSIS — J181 Lobar pneumonia, unspecified organism: Secondary | ICD-10-CM

## 2020-01-20 NOTE — Progress Notes (Signed)
  ID: Lori Miles, female    DOB: August 22, 1995, 24 y.o.   MRN: [id number]  Chief Complaint  Patient presents with  . Follow-up    Asthma     Referring provider: Scifres, Peter Minium  HPI: 24 year old female never smoker followed for cough variant asthma. Patient has a complicated medical history that includes cerebral palsy, mental insufficiency with severe behavioral issues and autism, seizure disorder.  Patient is followed at the asthma and allergy center with Dr. Lucie Leather for her asthma and allergic rhinitis. Patient has hypersomnia with multiple negative sleep studies for sleep apnea and narcolepsy  TEST/EVENTS :  PSG 09/22/18 >>total sleep time 367.5 min. AHI 0.7, SpO2 low 89%, PLMI 0. Took trazodone and melatonin. MSLT 09/23/18 >>sleep onset in 5 of 5 naps with mean sleep latency 29 seconds. 1 of 5 naps with SOREM.    -Sleep study January 06, 2015 at [place] sleep study at [facility] AHI 0.4 (total sleep time 428 minutes)moderate to loud snoring -Sleep study March 27, 2016 AHI 0.1 SaO2 low 90%, positive snoring -Sleep study December 10, 2017 AHI 0.3, SaO2 low 93% (sleep time 413 minutes) positive snoring  -A CPAP titration study was ordered June 2018 with known negative diagnostic sleep study with elimination of snoring on CPAP 10 cm H2O total sleep time 367 minutes with sleep efficiency 98%  01/20/2020 Follow up : PNA , Asthma  Patient presents for a follow-up from the emergency room.  Patient was recently seen in the emergency room for possible urinary tract infection.  Chest x-ray showed a possible right lower lobe infiltrate.  Patient was started on Levaquin, she is on day 5 out of 7. Patient is nonverbal.  Mother says she is worried that her Pneumonia is not getting better.  Chest xray today shows clearance of Pneumonia  ER records reviewed. Urinary Micro showed no specific organism with possible contamination.   [facility] , not wearing it because current living situation.  [facility] is following with [facility] for sleep issues.     Allergies  Allergen Reactions  . Abilify [Aripiprazole] Swelling and Palpitations  . Seroquel [Quetiapine Fumarate] Palpitations  . Amoxicillin-Pot Clavulanate Hives and Other (See Comments)    Has patient had a PCN reaction causing immediate rash, facial/tongue/throat swelling, SOB or lightheadedness with hypotension: No Has patient had a PCN reaction causing severe rash involving mucus membranes or skin necrosis:    #  #  YES  #  #  Has patient had a PCN reaction that required hospitalization: No Has patient had a PCN reaction occurring within the last 10 years: No   . Bactrim [Sulfamethoxazole-Trimethoprim] Other (See Comments)    Abdominal pain  . Doxycycline Other (See Comments)    Stomach cramps  . Macrobid [Nitrofurantoin Macrocrystal] Other (See Comments)    Sharp pain/ given with Tylenol 325 mg  . Keppra [Levetiracetam] Other (See Comments)    Aggressive behavior     Immunization History  Administered Date(s) Administered  . Pneumococcal Polysaccharide-23 08/06/2016  . Tdap 03/03/2017    Past Medical History:  Diagnosis Date  . Asthma   . Autism    mom states no actual dx, but admits she has some symptoms  . Bacteriuria   . Chronic constipation   . Cognitive developmental delay   . Constipation   . Disruptive behavior disorder   . Dyspnea    if she walks much  . Family history of adverse reaction to anesthesia     "  hives"-? if anesthesia did take antibiotic  . Frequency-urgency syndrome   . Gait disorder    ORGANIC PER NERUOLGIST NOTE (DR HICKLING)  . 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 GERD (gastroesophageal reflux disease)   . Gout   . H/O sinus tachycardia   . History of acute respiratory failure 02/14/2015   SECONDARY TO CAP  . History of recurrent UTIs   . Incontinent of feces   . Interstitial  cystitis   . Intertrigo 11/29/2019  . Moderate intellectual disability   . OSA (obstructive sleep apnea)    no machine yet   . Partial epilepsy with impairment of consciousness (HCC)    FOLLOWED BY DR HICKLING  . Pneumonia 2    4 times this year ( 2020)  . PONV (postoperative nausea and vomiting)   . Renal cyst 11/29/2019  . Seizure (HCC)    most recent sz " at the end of summer"-last sz years ago per mother  . Urinary incontinence     Tobacco History: Social History   Tobacco Use  Smoking Status Never Smoker  Smokeless Tobacco Never Used   Counseling given: Not Answered   Outpatient Medications Prior to Visit  Medication Sig Dispense Refill  . acetaminophen (TYLENOL) 325 MG tablet Take 2 tablets (650 mg total) by mouth every 6 (six) hours as needed for mild pain or moderate pain. 60 tablet 0  . albuterol (PROVENTIL) (2.5 MG/3ML) 0.083% nebulizer solution USE 1 VIAL VIA NEBULIZER EVERY 4 TO 6 HOURS AS NEEDED FOR COUGH 150 mL 0  . benzonatate (TESSALON) 100 MG capsule Take 1 capsule (100 mg total) by mouth every 8 (eight) hours for 5 days. 15 capsule 0  . budesonide (PULMICORT) 1 MG/2ML nebulizer solution USE 1 VIAL VIA NEBULIZER FOUR TIMES DAILY DURING RESPIRATORY INFECTIONS AND ASTHMA FLARES FOR 1-2 WEEKS 240 mL 5  . cetirizine (ZYRTEC) 10 MG tablet TAKE 1 TABLET(10 MG) BY MOUTH DAILY 30 tablet 5  . clotrimazole-betamethasone (LOTRISONE) cream Apply to affected area 2 times daily prn 45 g 0  . DEPAKOTE 500 MG DR tablet TAKE 1 TABLET BY MOUTH IN THE MORNING AND 2 AT NIGHT 90 tablet 5  . famotidine (PEPCID) 20 MG tablet TAKE 1 TABLET(20 MG) BY MOUTH EVERY 12 HOURS 180 tablet 3  . fluconazole (DIFLUCAN) 150 MG tablet Take 150 mg by mouth as needed (when on antibiotics).    . fluticasone (FLONASE) 50 MCG/ACT nasal spray Place 1 spray into both nostrils daily. 1 g 5  . formoterol (PERFOROMIST) 20 MCG/2ML nebulizer solution USE 2 MLS BY NEBULIZATION TWICE DAILY 120 mL 1  . hydrOXYzine  (ATARAX/VISTARIL) 25 MG tablet Take 25 mg by mouth 3 (three) times daily as needed (pain).     Marland Kitchen levofloxacin (LEVAQUIN) 750 MG tablet Take 1 tablet (750 mg total) by mouth daily for 6 days. 6 tablet 0  . Levonorgestrel-Ethinyl Estradiol (SEASONIQUE) 0.15-0.03 &0.01 MG tablet Take 1 tablet by mouth daily. 3 Package 3  . Melatonin 3 MG TABS Take 3 mg by mouth at bedtime.     . mometasone (NASONEX) 50 MCG/ACT nasal spray Use one spray in each nostril once daily as directed. 17 g 1  . montelukast (SINGULAIR) 10 MG tablet TAKE 1 TABLET(10 MG) BY MOUTH AT BEDTIME 30 tablet 5  . Nebulizer MISC Adult mask with tubing. Use as directed with nebulizer device. 4 each 12  . nystatin (MYCOSTATIN/NYSTOP) powder Apply 1 application topically 3 (three) times daily. 15  g 4  . nystatin cream (MYCOSTATIN) APPLY TOPICALLY TO RASH TWICE DAILY FOR 2 WEEKS 30 g 0  . ondansetron (ZOFRAN ODT) 4 MG disintegrating tablet Take 1 tablet (4 mg total) by mouth every 8 (eight) hours as needed for nausea. (Patient taking differently: Take 8 mg by mouth every 8 (eight) hours as needed for nausea. ) 10 tablet 0  . polyethylene glycol powder (GLYCOLAX/MIRALAX) 17 GM/SCOOP powder Dissolve 17 grams (1 capful) in at least 8 ounces of water/juice and drink twice daily (Patient taking differently: Take 17 g by mouth See admin instructions. Dissolve 17 grams (1 capful) in at least 8 ounces of water/juice and drink twice daily) 1020 g 1  . promethazine (PHENERGAN) 25 MG tablet Take 1 tablet (25 mg total) by mouth every 6 (six) hours as needed for nausea or vomiting. 30 tablet 0  . senna (SENOKOT) 8.6 MG tablet Take 1 tablet by mouth at bedtime.     Marland Kitchen terconazole (TERAZOL 7) 0.4 % vaginal cream Place 1 applicator vaginally at bedtime. (Patient taking differently: Place 1 applicator vaginally at bedtime as needed (yeast). ) 45 g 2  . traZODone (DESYREL) 50 MG tablet TAKE 2 TABLETS(100 MG) BY MOUTH AT BEDTIME 62 tablet 5   No  facility-administered medications prior to visit.     Review of Systems:   Constitutional:   No  weight loss, night sweats,  Fevers, chills, fatigue, or  lassitude.  HEENT:   No headaches,  Difficulty swallowing,  Tooth/dental problems, or  Sore throat,                No sneezing, itching, ear ache, nasal congestion, post nasal drip,   CV:  No chest pain,  Orthopnea, PND, swelling in lower extremities, anasarca, dizziness, palpitations, syncope.   GI  No heartburn, indigestion, abdominal pain, nausea, vomiting, diarrhea, change in bowel habits, loss of appetite, bloody stools.   Resp:   No chest wall deformity  Skin: no rash or lesions.  GU: no dysuria, change in color of urine, no urgency or frequency.  No flank pain, no hematuria   MS:  No joint pain or swelling.  No decreased range of motion.  No back pain.    Physical Exam  BP 96/70 (BP Location: Left Arm, Patient Position: Sitting, Cuff Size: Normal)   Pulse (!) 117   Temp (!) 97.5 F (36.4 C) (Temporal)   Ht 4' 9.5" (1.461 m)   Wt 160 lb (72.6 kg)   SpO2 98%   BMI 34.02 kg/m   GEN: A/Ox3; pleasant , NAD, BMI 34    HEENT:  [place]/AT,   NOSE-clear, THROAT-clear, no lesions, no postnasal drip or exudate noted.   NECK:  Supple w/ fair ROM; no JVD; normal carotid impulses w/o bruits; no thyromegaly or nodules palpated; no lymphadenopathy.    RESP  Clear  P & A; w/o, wheezes/ rales/ or rhonchi. no accessory muscle use, no dullness to percussion  CARD:  RRR, no m/r/g, no peripheral edema, pulses intact, no cyanosis or clubbing.  GI:   Soft & nt; nml bowel sounds; no organomegaly or masses detected.   Musco: Warm bil, no deformities or joint swelling noted.   Neuro: alert, no focal deficits noted.    Skin: Warm, no lesions or rashes    Lab Results:  CBC    Component Value Date/Time   WBC 9.3 01/15/2020 0855   RBC 3.99 01/15/2020 0855   HGB 12.4 01/15/2020 0855   HCT 37.2  01/15/2020 0855   PLT 186 01/15/2020  0855   MCV 93.2 01/15/2020 0855   MCH 31.1 01/15/2020 0855   MCHC 33.3 01/15/2020 0855   RDW 13.8 01/15/2020 0855   LYMPHSABS 3.0 01/15/2020 0855   MONOABS 0.7 01/15/2020 0855   EOSABS 0.1 01/15/2020 0855   BASOSABS 0.0 01/15/2020 0855    BMET    Component Value Date/Time   NA 138 01/15/2020 0855   K 3.9 01/15/2020 0855   CL 106 01/15/2020 0855   CO2 24 01/15/2020 0855   GLUCOSE 138 (H) 01/15/2020 0855   BUN 9 01/15/2020 0855   CREATININE 0.95 01/15/2020 0855   CREATININE 0.72 11/29/2019 0851   CALCIUM 9.0 01/15/2020 0855   GFRNONAA >60 01/15/2020 0855   GFRNONAA 116 11/29/2019 0851   GFRAA 135 11/29/2019 0851    BNP    Component Value Date/Time   BNP 16.1 10/24/2014 0745    ProBNP    Component Value Date/Time   PROBNP 9.0 05/05/2015 1604    Imaging: DG Chest 2 View  Result Date: 01/20/2020 CLINICAL DATA:  Pneumonia EXAM: CHEST - 2 VIEW COMPARISON:  01/15/2020 FINDINGS: The heart size and mediastinal contours are within normal limits. Both lungs are clear. The visualized skeletal structures are unremarkable. IMPRESSION: No active cardiopulmonary disease. Electronically Signed   By: Marlan Palau M.D.   On: 01/20/2020 09:49   DG Chest Portable 1 View  Result Date: 01/15/2020 CLINICAL DATA:  Cough. Chronic urinary tract infections. Patient typically gets pneumonia with urinary tract infection. History of asthma. EXAM: PORTABLE CHEST 1 VIEW COMPARISON:  05/17/2019 FINDINGS: Shallow lung inflation. Heart size is normal. There is minimal patchy density in the MEDIAL RIGHT lung base, possibly representing early infectious infiltrate. No pleural effusions. No pulmonary edema. IMPRESSION: Possible early RIGHT lower lobe infiltrate. Electronically Signed   By: Norva Pavlov M.D.   On: 01/15/2020 09:30      No flowsheet data found.  No results found for: NITRICOXIDE      Assessment & Plan:   Pneumonia Pneumonia clinically improving.  Chest x-ray shows  clearance.  Patient is to finish antibiotics as directed. She is to continue follow-up with asthma and allergy as planned. Plan Patient Instructions  Finish Levaquin.  Mucinex DM Twice daily  As needed  Cough/congestion  Saline nasal spray and gel. As needed   Continue on Pulmicort and Perforomist twice daily Use albuterol nebulizer or inhaler as needed Follow up with Dr. Sharyn Lull as planned and As needed   Follow up with [facility] for sleep issues /CPAP .  Follow up with our office As needed   Follow up with Urology as planned and As needed   Please contact office for sooner follow up if symptoms do not improve or worsen or seek emergency care       Cough variant asthma vs UACS/ vcd  Continue on current regimen.  Plan  Patient Instructions  Finish Levaquin.  Mucinex DM Twice daily  As needed  Cough/congestion  Saline nasal spray and gel. As needed   Continue on Pulmicort and Perforomist twice daily Use albuterol nebulizer or inhaler as needed Follow up with Dr. Sharyn Lull as planned and As needed   Follow up with [facility] for sleep issues /CPAP .  Follow up with our office As needed   Follow up with Urology as planned and As needed   Please contact office for sooner follow up if symptoms do not improve or worsen or seek emergency care  Excessive daytime sleepiness [facility]     Rubye Oaksammy Corderius Saraceni, NP 01/20/2020

## 2020-01-20 NOTE — Assessment & Plan Note (Signed)
Continue on current regimen.  Plan  Patient Instructions  Finish Levaquin.  Mucinex DM Twice daily  As needed  Cough/congestion  Saline nasal spray and gel. As needed   Continue on Pulmicort and Perforomist twice daily Use albuterol nebulizer or inhaler as needed Follow up with Dr. Sharyn Lull as planned and As needed   Follow up with [facility] for sleep issues /CPAP .  Follow up with our office As needed   Follow up with Urology as planned and As needed   Please contact office for sooner follow up if symptoms do not improve or worsen or seek emergency care

## 2020-01-20 NOTE — Assessment & Plan Note (Signed)
Pneumonia clinically improving.  Chest x-ray shows clearance.  Patient is to finish antibiotics as directed. She is to continue follow-up with asthma and allergy as planned. Plan Patient Instructions  Finish Levaquin.  Mucinex DM Twice daily  As needed  Cough/congestion  Saline nasal spray and gel. As needed   Continue on Pulmicort and Perforomist twice daily Use albuterol nebulizer or inhaler as needed Follow up with Dr. Sharyn Lull as planned and As needed   Follow up with [facility] for sleep issues /CPAP .  Follow up with our office As needed   Follow up with Urology as planned and As needed   Please contact office for sooner follow up if symptoms do not improve or worsen or seek emergency care

## 2020-01-20 NOTE — Telephone Encounter (Signed)
[  11:18 AM] Eron, Goble Klepper's #[phone] mom asked for you to call her when you get a chance. she did not say what she wanted you to call her for.  I called and spoke with patient's mother, listed on the DPR, she just wanted to let [name] know that they use the Pulmicort twice a day and 4 times a day when she has a flare up.  Nothing further needed.

## 2020-01-20 NOTE — Patient Instructions (Addendum)
Finish Levaquin.  Mucinex DM Twice daily  As needed  Cough/congestion  Saline nasal spray and gel. As needed   Continue on Pulmicort and Perforomist twice daily Use albuterol nebulizer or inhaler as needed Follow up with Dr. Sharyn Lull as planned and As needed   Follow up with [facility] for sleep issues /CPAP .  Follow up with our office As needed   Follow up with Urology as planned and As needed   Please contact office for sooner follow up if symptoms do not improve or worsen or seek emergency care

## 2020-01-20 NOTE — Assessment & Plan Note (Signed)
Continue follow-up with [facility] practice

## 2020-01-21 NOTE — Progress Notes (Signed)
Reviewed and agree with assessment/plan.   Kenna Kirn, MD [place] [facility] 01/21/2020, 8:43 AM Pager:  [phone]  

## 2020-01-22 ENCOUNTER — Telehealth: Payer: Self-pay | Admitting: Pulmonary Disease

## 2020-01-22 MED ORDER — CLINDAMYCIN HCL 150 MG PO CAPS: 150.0000 mg | ORAL_CAPSULE | Freq: Three times a day (TID) | ORAL | 0 refills | Status: AC

## 2020-01-22 NOTE — Telephone Encounter (Signed)
Mother called, noted that Lori Miles was crying, complaining of sinus pain, runny nose, congestion; no improvement with levaquin.  Says that clindamycin works.  Refusing to take any nose sprays.  Will call in short course of clindamycin, f/u in clinic next week.

## 2020-01-28 ENCOUNTER — Telehealth (INDEPENDENT_AMBULATORY_CARE_PROVIDER_SITE_OTHER): Payer: Self-pay | Admitting: Family

## 2020-01-28 DIAGNOSIS — G4700 Insomnia, unspecified: Secondary | ICD-10-CM

## 2020-01-28 MED ORDER — MELATONIN 3 MG PO TABS: ORAL_TABLET | ORAL | 0 refills | Status: AC

## 2020-01-28 NOTE — Telephone Encounter (Signed)
Mom reports that for the last few months, Lori Miles has not been sleeping. Sometimes she won't go to sleep and stays up all night, and sometimes she goes to sleep but then awakens and is up and down during the night. She is receiving Trazodone 100mg  and Melatonin 3mg  at bedtime. I recommended to Mom that she increase the Melatonin to 6mg  at bedtime. I reminded Mom of the need for sleep hygiene and routine for [facility]. I asked Mom to let me know in a few weeks how she is doing. Mom agreed with these plans. TG

## 2020-02-01 ENCOUNTER — Telehealth: Payer: Self-pay | Admitting: Pulmonary Disease

## 2020-02-01 ENCOUNTER — Telehealth: Payer: Self-pay | Admitting: Allergy and Immunology

## 2020-02-01 MED ORDER — PREDNISONE 20 MG PO TABS: 20.0000 mg | ORAL_TABLET | Freq: Every day | ORAL | 0 refills | Status: DC

## 2020-02-01 NOTE — Telephone Encounter (Signed)
This is a Dr. Lucie Leather patient. I am routing to him.  Malachi Bonds, MD [facility]

## 2020-02-01 NOTE — Telephone Encounter (Signed)
Patient's mother states patient did have pneumonia due to an UTI. Mother states the lung doctor gave her medication but she is still wheezing and coughing.   Please advise.

## 2020-02-01 NOTE — Telephone Encounter (Signed)
Spoke with pt's [name] (guardian), states that pt had previously been on Levaquin for pneumonia.  Pt had completed Levaquin, started experiencing increased wheezing, sob.  Mother states that Dr. Kendrick Fries called in clindamicin #40 this weekend (this is not on pt's med list/no documentation of call in chart).  Pt started to feel better on this medication but is now having worsening sob/wheezing.   Mother denies fever, mucus production, chills. States she has been giving albuterol neb daily.  Requesting additional recs.   Pharmacy: [facility] on [address] to TP since [facility] is not in office.  Please advise on additional recs.  Thanks.

## 2020-02-01 NOTE — Telephone Encounter (Signed)
Spoke with mother, aware of recs.  rx sent to preferred pharmacy.  Nothing further needed at this time- will close encounter.

## 2020-02-01 NOTE — Telephone Encounter (Signed)
Very sorry to hear Lori Miles is not feeling better. Last ov , Chest xray showed PNA had cleared. . No further abx at this time. If Dr. Kendrick Fries sent in abx please finish them as directed.  Would add mucinex DM Twice daily  As needed  Cough/congestion  Continue on aggressive maintenance regimen  And use Albuterol rescue inhaler or Neb As needed    Advise no further abx , need to use cautiously as she has been on numerous abx and this leads to drug resistance.   Can send in prednisone 20mg  daily for 5 days , #5 . No refills   Keep f/up with asthma and allergy .   Please contact office for sooner follow up if symptoms do not improve or worsen or seek emergency care

## 2020-02-01 NOTE — Telephone Encounter (Signed)
Spoke with mom, she stated that patient had pneumonia and was on antibiotics for it however she doesn't feel like it has helped. Mom stated that patient has used her inhalers and nebulizer but is still wheezing, experiencing some shortness of breath and coughing. Per mom patient had an x-ray done a couple of weeks ago, which came back fine. Mom stated that she called the pulmonologist today in regards to this matter and they send in five 20 mg tablets of prednisone for patient to take for the next 5 days, mom does not feel like that is enough to help the patient. Mom feels the patient is not getting any better, she stated the x-ray a couple of weeks ago may not have shown anything but feels it would now. Mom is wondering what she can do to help the patient. Please advise

## 2020-02-02 NOTE — Telephone Encounter (Signed)
It is correct that her situation is very complex and she will only be missed served by using telephone care or E-med care and she needs to be seen in the clinic in person.

## 2020-02-02 NOTE — Telephone Encounter (Signed)
Please inform mom that patient needs to be seen by either [name] or pulmonary.  We should not use telephone contacts and E-med visits to address her issues.  Maybe there is an opening in the [place] clinic today or tomorrow

## 2020-02-02 NOTE — Telephone Encounter (Signed)
Mom called the office back and wants message sent to Dr. Lucie Leather requesting more Prednisone.  Reviewed  previous reply back from Dr. Lucie Leather with mom and asked when would be good for appointment today or tomorrow.  Mom states they do not have transportation due to her husband working two jobs.  They could only come tomorrow at 3 and have to be out for her husband to be at his second job by 4:30.  Appointment I offered then did not work.  She also only wants to see Dr. Lucie Leather.  Mom does not want to see pulmonary.  She also does not want to bring [place] out.  Had mom locate her calendar and made sure she could come  next Tuesday  for a visit with Dr. Lucie Leather.  Appointment would have to be early so they could be back home for her husband to go to work.  Made appointment for [facility] at 8:30 am on 02/08/20 in [place] office.  Informed mom I would send the message to Dr. Lucie Leather regarding Prednisone, but the decision may remain the same since he wanted her to be seen and evaluated before getting more medication.  Mom voiced understanding.

## 2020-02-02 NOTE — Telephone Encounter (Signed)
Pts mom understands and will call us back to schedule soon as she gets home and has her calendar in front of her

## 2020-02-03 NOTE — Telephone Encounter (Signed)
No telephone care or E-med care. She needs to be seen in the clinic in person.

## 2020-02-03 NOTE — Telephone Encounter (Signed)
Called and spoke with the patient's mother and advised that she needs to keep the appointment for [facility] and have her come into office so that we can further discuss the larger amounts of prednisone she was wanting and a plan for her asthma. She verbalized understanding and stated that she was given a prescription on 02/01/20 for 6 prednisone 20mg  to take 1 daily. She was wondering if she could get a little more prednisone to help her with the 2 day gap until her appointment. She stated that "if not then she will have to cancel her appointment and take her to the hospital". I advised that I would send the message and see what the provider recommended. Patient's mother verbalized understanding.

## 2020-02-03 NOTE — Telephone Encounter (Signed)
Called and advised to patient's mother and advised that we do have an on call physician in the evenings and on weekends if she needs anything for [facility] until her appointment. Patient's mother verbalized understanding.

## 2020-02-07 ENCOUNTER — Other Ambulatory Visit: Payer: Self-pay | Admitting: Allergy and Immunology

## 2020-02-08 ENCOUNTER — Other Ambulatory Visit: Payer: Self-pay

## 2020-02-08 ENCOUNTER — Telehealth: Payer: Self-pay | Admitting: Pulmonary Disease

## 2020-02-08 ENCOUNTER — Ambulatory Visit (INDEPENDENT_AMBULATORY_CARE_PROVIDER_SITE_OTHER): Payer: Medicare Other | Admitting: Allergy and Immunology

## 2020-02-08 VITALS — BP 118/80 | HR 117 | Temp 99.0°F | Resp 16 | Ht <= 58 in | Wt 181.6 lb

## 2020-02-08 DIAGNOSIS — B999 Unspecified infectious disease: Secondary | ICD-10-CM

## 2020-02-08 DIAGNOSIS — K219 Gastro-esophageal reflux disease without esophagitis: Secondary | ICD-10-CM

## 2020-02-08 DIAGNOSIS — J4540 Moderate persistent asthma, uncomplicated: Secondary | ICD-10-CM | POA: Diagnosis not present

## 2020-02-08 MED ORDER — MUPIROCIN CALCIUM 2 % EX CREA: 1.0000 "application " | TOPICAL_CREAM | Freq: Three times a day (TID) | CUTANEOUS | 0 refills | Status: DC

## 2020-02-08 MED ORDER — BACTROBAN NASAL 2 % NA OINT: 1.0000 "application " | TOPICAL_OINTMENT | Freq: Three times a day (TID) | NASAL | 0 refills | Status: DC

## 2020-02-08 NOTE — Patient Instructions (Addendum)
  1. Continue a combination of the following:   A. budesonide 1 mg nebulized twice a day  B. Perforomist nebulized twice a day  C. Nasal saline 2 times per day  D. montelukast 10 mg daily  E. cetirizine 10 mg daily  2. Continue to Treat reflux with a combination of the following:   A. Famotidine 40mg  twice a day  B. No caffeine or chocolate consumption  3. Treat infection of nose:   A.  Bactroban ointment applied in the nose 3 times a day for 21 days  4. "Action plan" for asthma flare up:   A. increase budesonide to 4 times a day  5. Continue albuterol nebulization every 4-6 hours if needed  6.  Blood - anti-pneumococcal 23 antibody titers, IgA/G/M, ANCA with reflex  7. Return to clinic in 3 weeks or earlier if problem  8.  Arrange for Covid vaccine component testing.

## 2020-02-08 NOTE — Progress Notes (Signed)
[place]   Follow-up Note  Referring Provider: Scifres, Nicole Cellaorothy, PA-C Primary Provider: Karlyne GreenspanScifres, [name], PA-C Date of Office Visit: 02/08/2020  Subjective:   Lori Miles (DOB: 1996-01-04) is a 24 y.o. female who [facility] in re-evaluation of the following:  HPI: Lori Miles returns to this clinic in evaluation of respiratory tract inflammation and reflux.  Her last visit with me in this clinic was 09 June 2018.  [name] does not really communicate although she can follow instructions.  Most of the history was obtained from speaking with her mother and review of her chart in Epic.  Since I have seen her in this clinic she has continued to have issues with recurrent "infections" involving both her upper airway and lower airway and her urinary tract.  She apparently contracted a pneumonia, community-acquired, treated empirically with Levaquin, towards the tail end of October with resolution of most of her respiratory tract symptoms.  She apparently has recurrent urinary tract infections including with Klebsiella and Proteus and has been on multiple antibiotics throughout the year for this issue and she is presently on ciprofloxacin.  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  She is going to see a another urologist on 14 February 2020 for a second opinion.  [facility] is that her nose hurts.  She points to her nose and states that she has pain.  Her mom believes that she requires more steroids because she thinks her airway is congested.  She just received a 5-day course of steroids from pulmonary to address this issue and her mom does not think that this is really helped her very much.  She will not use the nasal steroid because it irritates  her nose but she does perform nasal saline washes.  She is not really having much coughing or wheezing at this point in time although when she had her pneumonia certainly she had coughing and wheezing.  She also appears to have posttussive emesis on a pretty frequent basis but if she is not coughing that she is not really having any emesis.  She is using a combination of nebulized budesonide 2-4 times per day and Perforomist twice a day.  As well, she continues on famotidine twice a day to address her reflux issue.  In 2018 we evaluated her for possible immunodeficiency and found that she had very low titers of antipneumococcal antibodies and we immunize her with Pneumovax and she had a very good response.  All other evaluation at that point in time including immunoglobulin levels and complement studies and mannose-binding protein were normal.  She has not received the flu vaccine or the Covid vaccine.  Apparently there is been a history of her developing some type a reaction regarding the components found in these vaccines although the history is very vague and the documentation of this reaction is nonexistent.  Allergies as of 02/08/2020      Reactions   Abilify [aripiprazole] Swelling, Palpitations   Seroquel [quetiapine Fumarate] Palpitations   Amoxicillin-pot Clavulanate Hives, Other (See Comments)   Has patient had a PCN reaction causing immediate rash, facial/tongue/throat swelling, SOB or lightheadedness with hypotension: No Has patient had a PCN reaction causing severe rash involving mucus membranes or skin necrosis:    #  #  YES  #  #  Has patient had a PCN reaction that required hospitalization: No Has patient had a PCN reaction occurring within the last 10 years: No   Bactrim [sulfamethoxazole-trimethoprim] Other (See Comments)   Abdominal pain   Doxycycline Other (See Comments)   Stomach cramps   Macrobid [nitrofurantoin Macrocrystal] Other (See Comments)   Sharp pain/ given with  Tylenol 325 mg   Keppra [levetiracetam] Other (See Comments)   Aggressive behavior       Medication List      acetaminophen 325 MG tablet Commonly known as: TYLENOL Take 2 tablets (650 mg total) by mouth every 6 (six) hours as needed for mild pain or moderate pain.   albuterol (2.5 MG/3ML) 0.083% nebulizer solution Commonly known as: PROVENTIL USE 1 VIAL VIA NEBULIZER EVERY 4 TO 6 HOURS AS NEEDED FOR COUGH   benzonatate 200 MG capsule Commonly known as: TESSALON Take 200 mg by mouth 3 (three) times daily as needed.   budesonide 1 MG/2ML nebulizer solution Commonly known as: PULMICORT USE 1 VIAL VIA NEBULIZER FOUR TIMES DAILY DURING RESPIRATORY INFECTIONS AND ASTHMA FLARES FOR 1-2 WEEKS   cetirizine 10 MG tablet Commonly known as: ZYRTEC TAKE 1 TABLET(10 MG) BY MOUTH DAILY   ciprofloxacin 500 MG tablet Commonly known as: CIPRO Take 500 mg by mouth 2 (two) times daily.   clotrimazole-betamethasone cream Commonly known as: LOTRISONE Apply to affected area 2 times daily prn   Depakote 500 MG DR tablet Generic drug: divalproex TAKE 1 TABLET BY MOUTH IN THE MORNING AND 2 AT NIGHT   famotidine 20 MG tablet Commonly known as: PEPCID TAKE 1 TABLET(20 MG) BY MOUTH EVERY 12 HOURS   fluconazole 150 MG tablet Commonly known as: DIFLUCAN Take 150 mg by mouth as needed (when on antibiotics).   fluticasone 50 MCG/ACT nasal spray Commonly known as: Flonase Place 1 spray into both nostrils daily.   formoterol 20 MCG/2ML nebulizer solution Commonly known as: PERFOROMIST USE 2 MLS BY NEBULIZATION TWICE DAILY   hydrOXYzine 25 MG tablet Commonly known as: ATARAX/VISTARIL Take 25 mg by mouth 3 (three) times daily as needed (pain).   Levonorgestrel-Ethinyl Estradiol 0.15-0.03 &0.01 MG tablet Commonly known as: Seasonique Take 1 tablet by mouth daily.   melatonin 3 MG Tabs tablet Take 2 tablets (6mg ) by mouth at bedtime.   mometasone 50 MCG/ACT nasal spray Commonly known  as: NASONEX Use one spray in each nostril once daily as directed.   montelukast 10 MG tablet Commonly known as: SINGULAIR TAKE 1 TABLET(10 MG) BY MOUTH AT BEDTIME   Nebulizer Misc Adult mask with tubing. Use as directed with nebulizer device.   nystatin powder Commonly known as: MYCOSTATIN/NYSTOP Apply 1 application topically 3 (three) times daily.   nystatin cream Commonly known as: MYCOSTATIN APPLY TOPICALLY TO RASH TWICE DAILY FOR 2 WEEKS   ondansetron 4 MG disintegrating tablet Commonly known as: Zofran ODT Take 1 tablet (4 mg total) by mouth every 8 (eight) hours as needed for nausea. What changed: how much to take   polyethylene glycol powder 17 GM/SCOOP powder Commonly known as: GLYCOLAX/MIRALAX Dissolve 17 grams (1 capful) in at least 8 ounces of water/juice and drink twice daily   promethazine 25 MG tablet Commonly known as: PHENERGAN Take 1 tablet (25 mg total) by mouth every 6 (six) hours as needed for nausea or vomiting.   senna 8.6 MG tablet Commonly known as: SENOKOT Take 1 tablet by mouth at bedtime.   terconazole 0.4 % vaginal cream  Commonly known as: TERAZOL 7 Place 1 applicator vaginally at bedtime   traZODone 50 MG tablet Commonly known as: DESYREL TAKE 2 TABLETS(100 MG) BY MOUTH AT BEDTIME       Past Medical History:  Diagnosis Date  . Asthma   . Autism    mom states no actual dx, but admits she has some symptoms  . Bacteriuria   . Chronic constipation   . Cognitive developmental delay   . Constipation   . Disruptive behavior disorder   . Dyspnea    if she walks much  . Family history of adverse reaction to anesthesia     "hives"-? if anesthesia did take antibiotic  . Frequency-urgency syndrome   . Gait disorder    ORGANIC PER NERUOLGIST NOTE (DR HICKLING)  . 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 GERD (gastroesophageal reflux disease)   . Gout   . H/O sinus tachycardia   . History of acute respiratory  failure 02/14/2015   SECONDARY TO CAP  . History of recurrent UTIs   . Incontinent of feces   . Interstitial cystitis   . Intertrigo 11/29/2019  . Moderate intellectual disability   . OSA (obstructive sleep apnea)    no machine yet   . Partial epilepsy with impairment of consciousness (HCC)    FOLLOWED BY DR HICKLING  . Pneumonia 2    4 times this year ( 2020)  . PONV (postoperative nausea and vomiting)   . Renal cyst 11/29/2019  . Seizure (HCC)    most recent sz " at the end of summer"-last sz years ago per mother  . Urinary incontinence     Past Surgical History:  Procedure Laterality Date  . CYSTOSCOPY N/A 06/03/2017   Procedure: CYSTOSCOPY WITH EXAM UNDER ANESTHESIA;  Surgeon: Jerilee Field, MD;  Location: [facility];  Service: Urology;  Laterality: N/A;  . CYSTOSCOPY  2020  . CYSTOSCOPY WITH HYDRODISTENSION AND BIOPSY  04/14/2012   Procedure: CYSTOSCOPY/BIOPSY/HYDRODISTENSION;  Surgeon: Lindaann Slough, MD;  Location: [facility];  Service: Urology;;  instillation of marcaine and pyridium  . EUA/  PAP SMEAR/  BREAST EXAM  03-12-2016   dr Erin Fulling [facility]  . EUA/ VAGINOSCOPY/ COLPOSCOPY FO THE HYMENAL RING  10-04-2009    dr Tamela Oddi  [facility]  . IR RADIOLOGIST EVAL & MGMT  07/29/2019  . IR RADIOLOGIST EVAL & MGMT  09/30/2019  . IR SCLEROTHERAPY OF A FLUID COLLECTION  09/08/2019  . RADIOLOGY WITH ANESTHESIA N/A 09/08/2019   Procedure: IR Sclerotherapy Treatment;  Surgeon: Radiologist, Medication, MD;  Location: [facility] OR;  Service: Radiology;  Laterality: N/A;  . TONSILLECTOMY      Review of systems negative except as noted in HPI / PMHx or noted below:  Review of Systems  Constitutional: Negative.   HENT: Negative.   Eyes: Negative.   Respiratory: Negative.   Cardiovascular: Negative.   Gastrointestinal: Negative.   Genitourinary: Negative.   Musculoskeletal: Negative.   Skin: Negative.   Neurological: Negative.   Endo/Heme/Allergies:  Negative.   Psychiatric/Behavioral: Negative.      Objective:   Vitals:   02/08/20 0831  BP: 118/80  Pulse: (!) 117  Resp: 16  Temp: 99 F (37.2 C)  SpO2: 99%   Height: 4' 9.5" (146.1 cm)  Weight: 181 lb 9.6 oz (82.4 kg)   Physical Exam Constitutional:      Appearance: She is not diaphoretic.  HENT:     Head: Normocephalic.  Right Ear: Tympanic membrane, ear canal and external ear normal.     Left Ear: Tympanic membrane, ear canal and external ear normal.     Nose: Mucosal edema (Erythematous, crusty, bloody, lining of nostrils bilaterally) present. No rhinorrhea.     Mouth/Throat:     Pharynx: Uvula midline. No oropharyngeal exudate.  Eyes:     Conjunctiva/sclera: Conjunctivae normal.  Neck:     Thyroid: No thyromegaly.     Trachea: Trachea normal. No tracheal tenderness or tracheal deviation.  Cardiovascular:     Rate and Rhythm: Normal rate and regular rhythm.     Heart sounds: Normal heart sounds, S1 normal and S2 normal. No murmur heard.   Pulmonary:     Effort: No respiratory distress.     Breath sounds: Normal breath sounds. No stridor. No wheezing or rales.  Lymphadenopathy:     Head:     Right side of head: No tonsillar adenopathy.     Left side of head: No tonsillar adenopathy.     Cervical: No cervical adenopathy.  Skin:    Findings: No erythema or rash.     Nails: There is no clubbing.  Neurological:     Mental Status: She is alert.     Diagnostics:    Spirometry was performed and demonstrated an FEV1 of 0.58 at 26 % of predicted.  Assessment and Plan:   1. Recurrent infections   2. Not well controlled moderate persistent asthma   3. Gastroesophageal reflux disease, unspecified whether esophagitis present     1. Continue a combination of the following:   A. budesonide 1 mg nebulized twice a day  B. Perforomist nebulized twice a day  C. Nasal saline 2 times per day  D. montelukast 10 mg daily  E. cetirizine 10 mg daily  2.  Continue to Treat reflux with a combination of the following:   A. Famotidine  twice a day  B. No caffeine or chocolate consumption  3. Treat infection of nose:   A.  Bactroban ointment applied in the nose 3 times a day for 21 days  4. "Action plan" for asthma flare up:   A. increase budesonide to 4 times a day  5. Continue albuterol nebulization every 4-6 hours if needed  6.  Blood - anti-pneumococcal 23 antibody titers, IgA/G/M, ANCA with reflex  7. Return to clinic in 3 weeks or earlier if problem  8.  Arrange for Covid vaccine component testing.  Jamelia appears to have a infectious form of rhinitis that I will treat with Bactroban for the next 3 weeks.  She certainly has a history of recurrent infections and although her evaluation for a B-cell immunodeficiency and other forms of immunodeficiencies was negative in 2018 I think we need to see if she can maintain immunological B-cell memory by checking the blood test noted above and given the fact that she has recurrent issues with her respiratory tract in the face of good anti-inflammatory medications for her airway we will check an ANCA to look at a more aggressive form of inflammation affecting her airway.  I would like for her to consistently use 80 mg of famotidine per day to address her reflux which still appears to be an active issue.  She will continue to use anti-inflammatory medications for her airway as noted above.  I will see her back in this clinic in 3 weeks or earlier.  We will work through whether or not she can receive the Covid vaccine by performing  Covid vaccine component testing.  Laurette Schimke, MD Allergy / Immunology [facility]

## 2020-02-08 NOTE — Telephone Encounter (Signed)
Called and spoke to pt's mother, Lori Miles. She states she would like Dr. Craige Cotta be aware of pts visit with Dr. Lucie Leather today 02/08/20. OV note is in chart.   Will forward to Dr. Craige Cotta as [name] Childes.

## 2020-02-09 ENCOUNTER — Encounter: Payer: Self-pay | Admitting: Allergy and Immunology

## 2020-02-16 ENCOUNTER — Telehealth: Payer: Self-pay | Admitting: *Deleted

## 2020-02-16 NOTE — Telephone Encounter (Signed)
Patient's mother called regarding her upcoming appointment with Dr Renold Don.  [facility] wanted him to know that she is asking Dr Logan Bores ([facility]) to fix Joci's birth defect (bilateral ureterocele) so that she no longer has UTIs.  Also, she would like to know what prayer Dr Drue Second mentioned at their last office visit.  Patient's mother is aware this is not in the chart and that Dr Drue Second is out of the office, but message will be routed to her. Andree Coss, RN

## 2020-02-17 LAB — STREP PNEUMONIAE 23 SEROTYPES IGG
Pneumo Ab Type 1*: 4 ug/mL (ref >1.3)
Pneumo Ab Type 12 (12F)*: <0.1 ug/mL — ABNORMAL LOW (ref >1.3)
Pneumo Ab Type 14*: 0.2 ug/mL — ABNORMAL LOW (ref >1.3)
Pneumo Ab Type 17 (17F)*: 2.2 ug/mL (ref >1.3)
Pneumo Ab Type 19 (19F)*: 0.5 ug/mL — ABNORMAL LOW (ref >1.3)
Pneumo Ab Type 2*: 1.5 ug/mL (ref >1.3)
Pneumo Ab Type 20*: 1.6 ug/mL (ref >1.3)
Pneumo Ab Type 22 (22F)*: 2 ug/mL (ref >1.3)
Pneumo Ab Type 23 (23F)*: <0.1 ug/mL — ABNORMAL LOW (ref >1.3)
Pneumo Ab Type 26 (6B)*: 2.1 ug/mL (ref >1.3)
Pneumo Ab Type 3*: 2.4 ug/mL (ref >1.3)
Pneumo Ab Type 34 (10A)*: 0.5 ug/mL — ABNORMAL LOW (ref >1.3)
Pneumo Ab Type 4*: 0.4 ug/mL — ABNORMAL LOW (ref >1.3)
Pneumo Ab Type 43 (11A)*: 0.4 ug/mL — ABNORMAL LOW (ref >1.3)
Pneumo Ab Type 5*: 0.4 ug/mL — ABNORMAL LOW (ref >1.3)
Pneumo Ab Type 51 (7F)*: 0.6 ug/mL — ABNORMAL LOW (ref >1.3)
Pneumo Ab Type 54 (15B)*: 1.3 ug/mL — ABNORMAL LOW (ref >1.3)
Pneumo Ab Type 56 (18C)*: <0.1 ug/mL — ABNORMAL LOW (ref >1.3)
Pneumo Ab Type 57 (19A)*: 2 ug/mL (ref >1.3)
Pneumo Ab Type 68 (9V)*: 1.3 ug/mL — ABNORMAL LOW (ref >1.3)
Pneumo Ab Type 70 (33F)*: 1.3 ug/mL — ABNORMAL LOW (ref >1.3)
Pneumo Ab Type 8*: 0.6 ug/mL — ABNORMAL LOW (ref >1.3)
Pneumo Ab Type 9 (9N)*: 0.4 ug/mL — ABNORMAL LOW (ref >1.3)

## 2020-02-17 LAB — IGG, IGA, IGM
IgA/Immunoglobulin A, Serum: 54 mg/dL — ABNORMAL LOW (ref 87–352)
IgG (Immunoglobin G), Serum: 802 mg/dL (ref 586–1602)
IgM (Immunoglobulin M), Srm: 82 mg/dL (ref 26–217)

## 2020-02-18 ENCOUNTER — Telehealth: Payer: Self-pay | Admitting: Allergy and Immunology

## 2020-02-18 NOTE — Telephone Encounter (Signed)
Please advise 

## 2020-02-18 NOTE — Telephone Encounter (Signed)
Patient's mother states the medication that was provided for patient's nose infection was working, but her nose was bleeding this morning and now it is swelling near the top like it was when she first came in on 11/23. Mother would like to know if another medication could be called in or what should she do.  Please advise.

## 2020-02-21 NOTE — Telephone Encounter (Signed)
Called and spoke to mom. Mom verbalized understanding.

## 2020-02-21 NOTE — Telephone Encounter (Signed)
Please inform mom that she should continue with nasal Bactroban 3 times per day.  She should precede the use of Bactroban by using nasal saline for moisturization then apply the Bactroban.  Please let us know how this works as the week goes forward.

## 2020-02-22 ENCOUNTER — Telehealth: Payer: Self-pay | Admitting: *Deleted

## 2020-02-22 DIAGNOSIS — Z3041 Encounter for surveillance of contraceptive pills: Secondary | ICD-10-CM

## 2020-02-22 MED ORDER — LEVONORGEST-ETH ESTRAD 91-DAY 0.15-0.03 &0.01 MG PO TABS: 1.0000 | ORAL_TABLET | Freq: Every day | ORAL | 0 refills | Status: DC

## 2020-02-22 NOTE — Telephone Encounter (Signed)
Patient's mother came into clinic this morning stating they were getting the patient dressed and birth control pill. They dropped the pill on the floor and could not fine the pill. Mom is concerned that patient will have some bleeding. Tried to advise mom that she could give the patient another pill out of the pick and nurse will send in a refill for another pack. However, mom really didn't understand and stated she only have pills for rest of the week. Patient is suppose to have PAP completed at the hospital per mom. [facility] pharmacy called, pharmacy not open until 9 AM. Left voice message that patient's mom will be coming by to pick up another refill.   Clovis Pu, RN

## 2020-02-29 ENCOUNTER — Other Ambulatory Visit: Payer: Self-pay

## 2020-02-29 ENCOUNTER — Encounter: Payer: Self-pay | Admitting: Allergy and Immunology

## 2020-02-29 ENCOUNTER — Telehealth: Payer: Self-pay | Admitting: Internal Medicine

## 2020-02-29 ENCOUNTER — Ambulatory Visit (INDEPENDENT_AMBULATORY_CARE_PROVIDER_SITE_OTHER): Payer: Medicare Other | Admitting: Allergy and Immunology

## 2020-02-29 VITALS — BP 116/74 | HR 113 | Resp 18

## 2020-02-29 DIAGNOSIS — J3089 Other allergic rhinitis: Secondary | ICD-10-CM | POA: Diagnosis not present

## 2020-02-29 DIAGNOSIS — K219 Gastro-esophageal reflux disease without esophagitis: Secondary | ICD-10-CM | POA: Diagnosis not present

## 2020-02-29 DIAGNOSIS — B999 Unspecified infectious disease: Secondary | ICD-10-CM | POA: Diagnosis not present

## 2020-02-29 DIAGNOSIS — J4540 Moderate persistent asthma, uncomplicated: Secondary | ICD-10-CM

## 2020-02-29 NOTE — Telephone Encounter (Signed)
Pts mother states the pts asthma doctor would like to increase her pepcid to 40mg  daily. Mother states pt is not having any issues on the 20mg  dose but the doctor wanted to increase to 40mg  due to the pts wheezing and her acid reflux. Mother wanted to know if Dr. feels this needs to be increased. Please advise.

## 2020-02-29 NOTE — Progress Notes (Signed)
[place] [facility] [place] - [place] - [place] - [place]   Follow-up Note  Referring Provider: Scifres, Nicole Cella, PA-C Primary Provider: Karlyne Greenspan Date of Office Visit: 02/29/2020  Subjective:   Dannisha Eckmann Strong (DOB: November 14, 1995) is a 24 y.o. female who [facility] in re-evaluation of the following:  HPI: Letonya returns to this clinic in evaluation of asthma and reflux and a history of recurrent infections. I last saw her in this clinic on 08 February 2020.   During her last visit she appeared to have some form of infectious rhinitis and we treated her with topical Bactroban which has resulted in a very good improvement regarding all of her nasal complaints.  Her mom informs me that she occasionally does develop some issues with "breathing" and still occasionally gets a albuterol inhaler while she continues to utilize her nebulized steroid and long-acting bronchodilator.  There does not appear to be much problems with her upper airways while using montelukast.  She is not using a nasal steroid at this point.  There does not appear to be any issues with reflux.  Allergies as of 02/29/2020      Reactions   Abilify [aripiprazole] Swelling, Palpitations   Seroquel [quetiapine Fumarate] Palpitations   Amoxicillin-pot Clavulanate Hives, Other (See Comments)   Has patient had a PCN reaction causing immediate rash, facial/tongue/throat swelling, SOB or lightheadedness with hypotension: No Has patient had a PCN reaction causing severe rash involving mucus membranes or skin necrosis:    #  #  YES  #  #  Has patient had a PCN reaction that required hospitalization: No Has patient had a PCN reaction occurring within the last 10 years: No   Bactrim [sulfamethoxazole-trimethoprim] Other (See Comments)   Abdominal pain   Doxycycline Other (See Comments)   Stomach cramps   Macrobid [nitrofurantoin Macrocrystal] Other (See Comments)    Sharp pain/ given with Tylenol 325 mg   Keppra [levetiracetam] Other (See Comments)   Aggressive behavior       Medication List      acetaminophen 325 MG tablet Commonly known as: TYLENOL Take 2 tablets (650 mg total) by mouth every 6 (six) hours as needed for mild pain or moderate pain.   albuterol (2.5 MG/3ML) 0.083% nebulizer solution Commonly known as: PROVENTIL USE 1 VIAL VIA NEBULIZER EVERY 4 TO 6 HOURS AS NEEDED FOR COUGH   Bactroban Nasal 2 % Generic drug: mupirocin nasal ointment Place 1 application into the nose 3 (three) times daily. Use  in each nostril twice daily for twenty one (21) days.   benzonatate 200 MG capsule Commonly known as: TESSALON Take 200 mg by mouth 3 (three) times daily as needed.   budesonide 1 MG/2ML nebulizer solution Commonly known as: PULMICORT USE 1 VIAL VIA NEBULIZER FOUR TIMES DAILY DURING RESPIRATORY INFECTIONS AND ASTHMA FLARES FOR 1-2 WEEKS   cetirizine 10 MG tablet Commonly known as: ZYRTEC TAKE 1 TABLET(10 MG) BY MOUTH DAILY   ciprofloxacin 500 MG tablet Commonly known as: CIPRO Take 500 mg by mouth 2 (two) times daily.   Depakote 500 MG DR tablet Generic drug: divalproex TAKE 1 TABLET BY MOUTH IN THE MORNING AND 2 AT NIGHT   famotidine 20 MG tablet Commonly known as: PEPCID TAKE 1 TABLET(20 MG) BY MOUTH EVERY 12 HOURS   formoterol 20 MCG/2ML nebulizer solution Commonly known as: PERFOROMIST USE 2 MLS BY NEBULIZATION TWICE DAILY   hydrOXYzine 25 MG tablet Commonly known as:  ATARAX/VISTARIL Take 25 mg by mouth 3 (three) times daily as needed (pain).   Levonorgestrel-Ethinyl Estradiol 0.15-0.03 &0.01 MG tablet Commonly known as: Seasonique Take 1 tablet by mouth daily.   melatonin 3 MG Tabs tablet Take 2 tablets (6mg ) by mouth at bedtime.   mometasone 50 MCG/ACT nasal spray Commonly known as: NASONEX Use one spray in each nostril once daily as directed.   montelukast 10 MG tablet Commonly known as:  SINGULAIR TAKE 1 TABLET(10 MG) BY MOUTH AT BEDTIME   Nebulizer Misc Adult mask with tubing. Use as directed with nebulizer device.   polyethylene glycol powder 17 GM/SCOOP powder Commonly known as: GLYCOLAX/MIRALAX Dissolve 17 grams (1 capful) in at least 8 ounces of water/juice and drink twice daily    promethazine 25 MG tablet Commonly known as: PHENERGAN Take 1 tablet (25 mg total) by mouth every 6 (six) hours as needed for nausea or vomiting.   senna 8.6 MG tablet Commonly known as: SENOKOT Take 1 tablet by mouth at bedtime.   traZODone 50 MG tablet Commonly known as: DESYREL TAKE 2 TABLETS(100 MG) BY MOUTH AT BEDTIME       Past Medical History:  Diagnosis Date  . Asthma   . Autism    mom states no actual dx, but admits she has some symptoms  . Bacteriuria   . Chronic constipation   . Cognitive developmental delay   . Constipation   . Disruptive behavior disorder   . Dyspnea    if she walks much  . Family history of adverse reaction to anesthesia     "hives"-? if anesthesia did take antibiotic  . Frequency-urgency syndrome   . Gait disorder    ORGANIC PER NERUOLGIST NOTE (DR HICKLING)  . 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 GERD (gastroesophageal reflux disease)   . Gout   . H/O sinus tachycardia   . History of acute respiratory failure 02/14/2015   SECONDARY TO CAP  . History of recurrent UTIs   . Incontinent of feces   . Interstitial cystitis   . Intertrigo 11/29/2019  . Moderate intellectual disability   . OSA (obstructive sleep apnea)    no machine yet   . Partial epilepsy with impairment of consciousness (HCC)    FOLLOWED BY DR HICKLING  . Pneumonia 2    4 times this year ( 2020)  . PONV (postoperative nausea and vomiting)   . Renal cyst 11/29/2019  . Seizure (HCC)    most recent sz " at the end of summer"-last sz years ago per mother  . Urinary incontinence     Past Surgical History:  Procedure Laterality Date   . CYSTOSCOPY N/A 06/03/2017   Procedure: CYSTOSCOPY WITH EXAM UNDER ANESTHESIA;  Surgeon: Jerilee FieldEskridge, [name], MD;  [facility];  Service: Urology;  Laterality: N/A;  . CYSTOSCOPY  2020  . CYSTOSCOPY WITH HYDRODISTENSION AND BIOPSY  04/14/2012   Procedure: CYSTOSCOPY/BIOPSY/HYDRODISTENSION;  Surgeon: Lindaann SloughMarc-Henry Nesi, MD;  [facility] [place];  Service: Urology;;  instillation of marcaine and pyridium  . EUA/  PAP SMEAR/  BREAST EXAM  03-12-2016   dr [facility]  . EUA/ VAGINOSCOPY/ COLPOSCOPY FO THE HYMENAL RING  10-04-2009    dr Tam[facility]  . IR RADIOLOGIST EVAL & MGMT  07/29/2019  . IR RADIOLOGIST EVAL & MGMT  09/30/2019  . IR SCLEROTHERAPY OF A FLUID COLLECTION  09/08/2019  . RADIOLOGY WITH ANESTHESIA N/A 09/08/2019   Procedure: IR Sclerotherapy Treatment;  Surgeon: Radiologist, Medication, MD;  Location: [facility] OR;  Service: Radiology;  Laterality: N/A;  . TONSILLECTOMY      Review of systems negative except as noted in HPI / PMHx or noted below:  Review of Systems  Constitutional: Negative.   HENT: Negative.   Eyes: Negative.   Respiratory: Negative.   Cardiovascular: Negative.   Gastrointestinal: Negative.   Genitourinary: Negative.   Musculoskeletal: Negative.   Skin: Negative.   Neurological: Negative.   Endo/Heme/Allergies: Negative.   Psychiatric/Behavioral: Negative.      Objective:   Vitals:   02/29/20 0834  BP: 116/74  Pulse: (!) 113  Resp: 18  SpO2: 96%          Physical Exam Constitutional:      Appearance: She is not diaphoretic.  HENT:     Head: Normocephalic.     Right Ear: Tympanic membrane, ear canal and external ear normal.     Left Ear: Tympanic membrane, ear canal and external ear normal.     Nose: Nose normal. No mucosal edema or rhinorrhea.     Mouth/Throat:     Mouth: Oropharynx is clear and moist and mucous membranes are normal.     Pharynx: Uvula midline. No oropharyngeal exudate.  Eyes:      Conjunctiva/sclera: Conjunctivae normal.  Neck:     Thyroid: No thyromegaly.     Trachea: Trachea normal. No tracheal tenderness or tracheal deviation.  Cardiovascular:     Rate and Rhythm: Normal rate and regular rhythm.     Heart sounds: Normal heart sounds, S1 normal and S2 normal. No murmur heard.   Pulmonary:     Effort: No respiratory distress.     Breath sounds: Normal breath sounds. No stridor. No wheezing or rales.  Musculoskeletal:        General: No edema.  Lymphadenopathy:     Head:     Right side of head: No tonsillar adenopathy.     Left side of head: No tonsillar adenopathy.     Cervical: No cervical adenopathy.  Skin:    Findings: No erythema or rash.     Nails: There is no clubbing.  Neurological:     Mental Status: She is alert.     Diagnostics:   Results of blood tests obtained 08 February 2020 identified IgG 802 mg/DL, IgA 54 mg/DL, IgM 82 NG/DL, only 8 serotypes of pneumococcus with high titer antibodies on the pneumo 23 serotypes assay.  In comparison to 3 years ago she appears to have lost some protection directed against various serotypes.  Assessment and Plan:   1. Asthma, moderate persistent, well-controlled   2. Other allergic rhinitis   3. Gastroesophageal reflux disease, unspecified whether esophagitis present   4. Recurrent infections     1. Continue a combination of the following:   A. budesonide 1 mg nebulized twice a day  B. Perforomist nebulized twice a day  C. Nasal saline 2 times per day  D. montelukast 10 mg daily  E. cetirizine 10 mg daily  F. Bactroban ointment applied in the nose daily  2. Continue to Treat reflux with a combination of the following:   A. Famotidine 40mg  twice a day  B. No caffeine or chocolate consumption  3. "Action plan" for asthma flare up:   A. increase budesonide to 4 times a day  4. Continue albuterol nebulization every 4-6 hours if needed  5. Obtain a pneumovax vaccine with primary care  doctor and 6 weeks after vaccine return  to clinic for blood tests (Pneumo 23 / ANCA w/R)  6.  Arrange for Covid vaccine component testing.  I think Sharlene has had some improvement at this point while using Bactroban 3 times per day for the past 3 weeks and will now just have her utilize this agent 1 time per day at this point.  She will remain off nasal steroids at this point.  She will continue on anti-inflammatory agents for airway as noted above and therapy directed against reflux.  We need to work through whether or not she has lost B-cell memory by having her repeat a Pneumovax and then checking her post vaccination pneumococcal titer 6 weeks after that vaccine.  As well, a ANCA was not obtained during her last blood draw and will get that study performed when we check her Pneumo 23 titers.  I will contact her mom with the results of the blood test once it is available for review.  Laurette Schimke, MD Allergy / Immunology [facility]

## 2020-02-29 NOTE — Patient Instructions (Addendum)
  1. Continue a combination of the following:   A. budesonide 1 mg nebulized twice a day  B. Perforomist nebulized twice a day  C. Nasal saline 2 times per day  D. montelukast 10 mg daily  E. cetirizine 10 mg daily  F. Bactroban ointment applied in the nose daily  2. Continue to Treat reflux with a combination of the following:   A. Famotidine 40mg  twice a day  B. No caffeine or chocolate consumption  3. "Action plan" for asthma flare up:   A. increase budesonide to 4 times a day  4. Continue albuterol nebulization every 4-6 hours if needed  5. Obtain a pneumovax vaccine with primary care doctor and 6 weeks after vaccine return to clinic for blood tests (Pneumo 23 / ANCA w/R)  6.  Arrange for Covid vaccine component testing.

## 2020-02-29 NOTE — Telephone Encounter (Signed)
[name] is requesting a call back from a nurse sometime later, caller did not disclose any further information.

## 2020-02-29 NOTE — Telephone Encounter (Signed)
Spoke with pts mother and she is aware. 

## 2020-02-29 NOTE — Telephone Encounter (Signed)
Certainly okay to increase but I would recommend increasing to 20 mg twice daily (rather than 40 mg once daily) to see if asthma symptoms get better.  Asthma symptoms in some patients can be exacerbated by reflux

## 2020-03-01 ENCOUNTER — Ambulatory Visit (INDEPENDENT_AMBULATORY_CARE_PROVIDER_SITE_OTHER): Payer: Medicare Other | Admitting: Internal Medicine

## 2020-03-01 ENCOUNTER — Telehealth: Payer: Self-pay | Admitting: Allergy and Immunology

## 2020-03-01 ENCOUNTER — Telehealth: Payer: Self-pay

## 2020-03-01 ENCOUNTER — Encounter: Payer: Self-pay | Admitting: Internal Medicine

## 2020-03-01 ENCOUNTER — Encounter: Payer: Self-pay | Admitting: Allergy and Immunology

## 2020-03-01 VITALS — BP 108/75 | HR 118 | Temp 98.0°F | Wt 168.0 lb

## 2020-03-01 DIAGNOSIS — Z87440 Personal history of urinary (tract) infections: Secondary | ICD-10-CM | POA: Diagnosis not present

## 2020-03-01 LAB — URINALYSIS, ROUTINE W REFLEX MICROSCOPIC
Bilirubin Urine: NEGATIVE
Glucose, UA: NEGATIVE
Hgb urine dipstick: NEGATIVE
Hyaline Cast: NONE SEEN /LPF
Nitrite: NEGATIVE
Specific Gravity, Urine: 1.03 (ref 1.001–1.03)
pH: 5.5 (ref 5.0–8.0)

## 2020-03-01 MED ORDER — NYSTATIN 100000 UNIT/GM EX POWD: 1.0000 "application " | Freq: Three times a day (TID) | CUTANEOUS | 6 refills | Status: DC

## 2020-03-01 NOTE — Telephone Encounter (Signed)
-----   Message from Raymondo Band, MD sent at 03/01/2020  2:59 PM EST ----- Please let patient know that the urinalysis is rather normal and coupled with lack of symptoms, don't suggest UTI  thanks

## 2020-03-01 NOTE — Telephone Encounter (Signed)
Patient's mother made aware of results. States she still believes patient may have UTI even though UA does not indicate UTI. Valarie Cones

## 2020-03-01 NOTE — Telephone Encounter (Signed)
Please fax over my last note.  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

## 2020-03-01 NOTE — Telephone Encounter (Signed)
Office note faxed to patient's PCP office

## 2020-03-01 NOTE — Progress Notes (Signed)
Subjective:   Patient did not verbalize complaints; she is on the ipad throughout interview; when engaged not able to tell me anything except responding to her name and basic hello   Patient ID: Lori GreenerJasmine J Miles, female    DOB: August 28, 1995, 24 y.o.   MRN: [id number]  HPI Lori CellaJasmine is a young 24 yo female with asthma, developmental delay, epilepsy, behavior control issue on depekote, previously seen by [facility] for recurrent uti   She previously seen dr Drue SecondSnider and Daiva EvesVan Dam. And It appears from her mother that she was brought to multiple other providers in ?urgent care clinic  The entire history is via her mother.  Her mother related that if we don't treat her uti or each time uti comes she would develop pneumonia or sinusitis. This was also mentioned previous encounter in some form  She reports her daughter would be grabbing at her or different body parts of herself. Most recently scratching her groin (was dx'ed with intertrigo 11/2019 and given nystatin powder by dr Daiva EvesVan Dam -- which helped). There was prior ?blood in urine and foul odor of urine. At some point she has a bladder cyst removed by urology (previous note mention IR treatment of renal cyst -- I couldn't find record of that)    There doesn't seem to be urgency/frequency or if some form of dysuria  Patient has multiple prior urine cultures. Most showed < 100k colonies growth or multiple species that are not cultured. There were some that had proteus species, and esbl klebsiella  In 05/2017 had a ct renal stone study that shows: Adrenals/Urinary Tract: The adrenal glands are unremarkable. Unchanged 4.8 cm right renal cyst. No renal or ureteral calculi. No hydronephrosis. The bladder is unremarkable for the degree of Distention.  Her mother said she was given hydroxyzine prior to seeing me and that helps with the grabbing. She refers to hydroxyzine initially as a uti medication  Dr Daiva EvesVan Dam and Drue SecondSnider had suggested high threshold  to treat uti and concern of frequent use of abx  There seems to be some augmentin and cipro rx'ed recently   It appears also she has been followed by alergy/immunology for her asthma. There seems to be Ig levels and pneumococcal Ab's level testing. She is scheduled to receive pneumovax again and get repeat testing for pneumococcal ab. The igg level checked recently is normal. She was given bactroban topical for nasal rhinitis    Past Medical History:  Diagnosis Date  . Asthma   . Autism    mom states no actual dx, but admits she has some symptoms  . Bacteriuria   . Chronic constipation   . Cognitive developmental delay   . Constipation   . Disruptive behavior disorder   . Dyspnea    if she walks much  . Family history of adverse reaction to anesthesia     "hives"-? if anesthesia did take antibiotic  . Frequency-urgency syndrome   . Gait disorder    ORGANIC PER NERUOLGIST NOTE (DR HICKLING)  . 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 GERD (gastroesophageal reflux disease)   . Gout   . H/O sinus tachycardia   . History of acute respiratory failure 02/14/2015   SECONDARY TO CAP  . History of recurrent UTIs   . Incontinent of feces   . Interstitial cystitis   . Intertrigo 11/29/2019  . Moderate intellectual disability   . OSA (obstructive sleep apnea)    no machine yet   .  Partial epilepsy with impairment of consciousness (HCC)    FOLLOWED BY DR HICKLING  . Pneumonia 2    4 times this year ( 2020)  . PONV (postoperative nausea and vomiting)   . Renal cyst 11/29/2019  . Seizure (HCC)    most recent sz " at the end of summer"-last sz years ago per mother  . Urinary incontinence     Past Surgical History:  Procedure Laterality Date  . CYSTOSCOPY N/A 06/03/2017   Procedure: CYSTOSCOPY WITH EXAM UNDER ANESTHESIA;  Surgeon: Jerilee Field, MD;  Location: [facility];  Service: Urology;  Laterality: N/A;  . CYSTOSCOPY  2020  .  CYSTOSCOPY WITH HYDRODISTENSION AND BIOPSY  04/14/2012   Procedure: CYSTOSCOPY/BIOPSY/HYDRODISTENSION;  Surgeon: Lindaann Slough, MD;  Location: [facility] [place];  Service: Urology;;  instillation of marcaine and pyridium  . EUA/  PAP SMEAR/  BREAST EXAM  03-12-2016   dr Erin Fulling [facility]  . EUA/ VAGINOSCOPY/ COLPOSCOPY FO THE HYMENAL RING  10-04-2009    dr Tamela Oddi  [facility]  . IR RADIOLOGIST EVAL & MGMT  07/29/2019  . IR RADIOLOGIST EVAL & MGMT  09/30/2019  . IR SCLEROTHERAPY OF A FLUID COLLECTION  09/08/2019  . RADIOLOGY WITH ANESTHESIA N/A 09/08/2019   Procedure: IR Sclerotherapy Treatment;  Surgeon: Radiologist, Medication, MD;  Location: [facility];  Service: Radiology;  Laterality: N/A;  . TONSILLECTOMY      Family History  Problem Relation Age of Onset  . Seizures Mother   . Seizures Sister        1 sister has  . Pancreatic cancer Sister   . Colon cancer Neg Hx   . Colon polyps Neg Hx   . Esophageal cancer Neg Hx   . Rectal cancer Neg Hx   . Stomach cancer Neg Hx       Social History   Socioeconomic History  . Marital status: Single    Spouse name: Not on file  . Number of children: Not on file  . Years of education: Not on file  . Highest education level: Not on file  Occupational History  . Not on file  Tobacco Use  . Smoking status: Never Smoker  . Smokeless tobacco: Never Used  Vaping Use  . Vaping Use: Never used  Substance and Sexual Activity  . Alcohol use: No    Alcohol/week: 0.0 standard drinks  . Drug use: No  . Sexual activity: Never    Birth control/protection: Pill  Other Topics Concern  . Not on file  Social History Narrative   Lives with mom ([name] Mackintosh) and half-sister Donita Newland) who is also mentally impaired.  Has CAP assistance, urinary incontinence, needs help with feeding,dressing, toileting   Graduated from [employer].  She enjoys playing with cars, dancing, and dogs.   Social Determinants of Health   Financial  Resource Strain: Not on file  Food Insecurity: Not on file  Transportation Needs: Not on file  Physical Activity: Not on file  Stress: Not on file  Social Connections: Not on file    Allergies  Allergen Reactions  . Abilify [Aripiprazole] Swelling and Palpitations  . Seroquel [Quetiapine Fumarate] Palpitations  . Amoxicillin-Pot Clavulanate Hives and Other (See Comments)    Has patient had a PCN reaction causing immediate rash, facial/tongue/throat swelling, SOB or lightheadedness with hypotension: No Has patient had a PCN reaction causing severe rash involving mucus membranes or skin necrosis:    #  #  YES  #  #  Has patient had a PCN reaction that required hospitalization: No Has patient had a PCN reaction occurring within the last 10 years: No   . Bactrim [Sulfamethoxazole-Trimethoprim] Other (See Comments)    Abdominal pain  . Doxycycline Other (See Comments)    Stomach cramps  . Macrobid [Nitrofurantoin Macrocrystal] Other (See Comments)    Sharp pain/ given with Tylenol 325 mg  . Keppra [Levetiracetam] Other (See Comments)    Aggressive behavior      Current Outpatient Medications:  .  albuterol (PROVENTIL) (2.5 MG/3ML) 0.083% nebulizer solution, USE 1 VIAL VIA NEBULIZER EVERY 4 TO 6 HOURS AS NEEDED FOR COUGH, Disp: 150 mL, Rfl: 0 .  benzonatate (TESSALON) 200 MG capsule, Take 200 mg by mouth 3 (three) times daily as needed., Disp: , Rfl:  .  budesonide (PULMICORT) 1 MG/2ML nebulizer solution, USE 1 VIAL VIA NEBULIZER FOUR TIMES DAILY DURING RESPIRATORY INFECTIONS AND ASTHMA FLARES FOR 1-2 WEEKS, Disp: 240 mL, Rfl: 5 .  cetirizine (ZYRTEC) 10 MG tablet, TAKE 1 TABLET(10 MG) BY MOUTH DAILY, Disp: 30 tablet, Rfl: 5 .  clotrimazole-betamethasone (LOTRISONE) cream, Apply to affected area 2 times daily prn, Disp: 45 g, Rfl: 0 .  DEPAKOTE 500 MG DR tablet, TAKE 1 TABLET BY MOUTH IN THE MORNING AND 2 AT NIGHT, Disp: 90 tablet, Rfl: 5 .  fluconazole (DIFLUCAN) 150 MG tablet, Take  150 mg by mouth as needed (when on antibiotics)., Disp: , Rfl:  .  fluticasone (FLONASE) 50 MCG/ACT nasal spray, Place 1 spray into both nostrils daily., Disp: 1 g, Rfl: 5 .  formoterol (PERFOROMIST) 20 MCG/2ML nebulizer solution, USE 2 MLS BY NEBULIZATION TWICE DAILY, Disp: 120 mL, Rfl: 1 .  hydrOXYzine (ATARAX/VISTARIL) 25 MG tablet, Take 25 mg by mouth 3 (three) times daily as needed (pain). , Disp: , Rfl:  .  Levonorgestrel-Ethinyl Estradiol (SEASONIQUE) 0.15-0.03 &0.01 MG tablet, Take 1 tablet by mouth daily., Disp: 28 tablet, Rfl: 0 .  melatonin 3 MG TABS tablet, Take 2 tablets (6mg ) by mouth at bedtime., Disp: 60 tablet, Rfl: 0 .  mometasone (NASONEX) 50 MCG/ACT nasal spray, Use one spray in each nostril once daily as directed., Disp: 17 g, Rfl: 1 .  montelukast (SINGULAIR) 10 MG tablet, TAKE 1 TABLET(10 MG) BY MOUTH AT BEDTIME, Disp: 30 tablet, Rfl: 5 .  mupirocin nasal ointment (BACTROBAN NASAL) 2 %, Place 1 application into the nose 3 (three) times daily. Use  in each nostril twice daily for twenty one (21) days., Disp: 70 g, Rfl: 0 .  Nebulizer MISC, Adult mask with tubing. Use as directed with nebulizer device., Disp: 4 each, Rfl: 12 .  nystatin (MYCOSTATIN/NYSTOP) powder, Apply 1 application topically 3 (three) times daily., Disp: 15 g, Rfl: 4 .  nystatin cream (MYCOSTATIN), APPLY TOPICALLY TO RASH TWICE DAILY FOR 2 WEEKS, Disp: 30 g, Rfl: 0 .  ondansetron (ZOFRAN ODT) 4 MG disintegrating tablet, Take 1 tablet (4 mg total) by mouth every 8 (eight) hours as needed for nausea. (Patient taking differently: Take 8 mg by mouth every 8 (eight) hours as needed for nausea.), Disp: 10 tablet, Rfl: 0 .  polyethylene glycol powder (GLYCOLAX/MIRALAX) 17 GM/SCOOP powder, Dissolve 17 grams (1 capful) in at least 8 ounces of water/juice and drink twice daily (Patient taking differently: Take 17 g by mouth See admin instructions. Dissolve 17 grams (1 capful) in at least 8 ounces of water/juice and drink  twice daily), Disp: 1020 g, Rfl: 1 .  predniSONE (DELTASONE) 20 MG tablet, Take  1 tablet (20 mg total) by mouth daily with breakfast., Disp: 5 tablet, Rfl: 0 .  promethazine (PHENERGAN) 25 MG tablet, Take 1 tablet (25 mg total) by mouth every 6 (six) hours as needed for nausea or vomiting., Disp: 30 tablet, Rfl: 0 .  senna (SENOKOT) 8.6 MG tablet, Take 1 tablet by mouth at bedtime. , Disp: , Rfl:  .  terconazole (TERAZOL 7) 0.4 % vaginal cream, Place 1 applicator vaginally at bedtime. (Patient taking differently: Place 1 applicator vaginally at bedtime as needed (yeast).), Disp: 45 g, Rfl: 2 .  traZODone (DESYREL) 50 MG tablet, TAKE 2 TABLETS(100 MG) BY MOUTH AT BEDTIME, Disp: 62 tablet, Rfl: 5 .  acetaminophen (TYLENOL) 325 MG tablet, Take 2 tablets (650 mg total) by mouth every 6 (six) hours as needed for mild pain or moderate pain., Disp: 60 tablet, Rfl: 0 .  amoxicillin-clavulanate (AUGMENTIN) 875-125 MG tablet, Take 1 tablet by mouth 2 (two) times daily. (Patient not taking: Reported on 03/01/2020), Disp: , Rfl:  .  ciprofloxacin (CIPRO) 500 MG tablet, Take 500 mg by mouth 2 (two) times daily. (Patient not taking: Reported on 03/01/2020), Disp: , Rfl:    Review of Systems  Unable to perform ROS: Patient nonverbal       Objective:   Physical Exam Vitals reviewed.  Constitutional:      Appearance: Normal appearance.  HENT:     Head: Normocephalic.     Nose: Nose normal.  Eyes:     Extraocular Movements: Extraocular movements intact.     Pupils: Pupils are equal, round, and reactive to light.  Cardiovascular:     Rate and Rhythm: Normal rate and regular rhythm.  Pulmonary:     Effort: Pulmonary effort is normal. No respiratory distress.     Breath sounds: No wheezing.  Neurological:     General: No focal deficit present.     Mental Status: She is alert. Mental status is at baseline.     She is mostly nonverbal but tracking me    Assessment & Plan:    #mother reporting  history of recurrent UTIs   Difficult case. I don't think we had truly established dx of uti previously. Her sx reported (grabbing) is nonspecific and can be behavior related; given hydroxyzine that seems to control.   I referred to the idsa guideline on assypmatic bacteriuria and diagnostic criteria for uti to the mother. I discussed that we have to establish a pattern (see AVS) to ascertain uti  I also relate that allergy/immunology appear to be workup her up for frequent infection although I don't see/hear a story of any objective evidence of recurrent infection  She was given topical bactroban. This would have long term resistance issue rather than really addressing any rhinitis issue   -consider stopping bactroban topical -rx nystatin powder for use as needed for intetrigo -check ua today  -advise mother to avoid abx and seek it at our clinic -trial of recording vitals/symptoms and just observing sx. If pyelo occurs/diagnosed, we potentially could have a pattern for dx of her uti.   -discuss not treating asysmptomatic bacteriuria which patient likely have  -f/u as needed

## 2020-03-01 NOTE — Patient Instructions (Signed)
Thank you for using our service today   This is a complicated situation What we want to avoid is development of antibiotics resistance, NOT treating bacteria colonization (patient can have bacteria in the urine culture that represent only colonization and NOT infection), and have complication of antibiotics like cdiff   Therefore, we need to establish a pattern of her true UTI.  Record vitals, symptoms Do not use antibiotics until seen by [name] Check urine today; there are also standing urinalysis ordered you can repeat in 2-4 weeks if needed The worse case scenario if we don't treat UTI if it occurs is that it can ascend to cause kidney infection If we miss this and she ends up having kidney infection (if she goes to hospital, ask for ID team to see her), then we can establish a pattern of potentially true uti symptoms   So it is the set of symptoms/vitals BESIDES urine culture and urinalysis findings that establish UTI, not just presence of bacteria on culture    Thank you Follow up as needed   I have placed nystatin powder for you at the [facility] pharmacy you can pick up today

## 2020-03-01 NOTE — Telephone Encounter (Signed)
Dr Kozlow please advise 

## 2020-03-01 NOTE — Telephone Encounter (Signed)
[facility] physician called and stated why they needed to give the patient a Pneumovax vaccine again. Advise patient that Dr. Lucie Leather did recommended this due to recent labs. Dr. Lucie Leather can you advise on the performed on 02/08/2020 labs so that we may fax over to PCP office for verification as to why she is needing the pneumovax vaccine?

## 2020-03-01 NOTE — Telephone Encounter (Signed)
Patient's mother would like a nurse to call her. Patient's PCP is not wanting to give her the pneumovax because it has not been 5 years since her last one.  Please advise.

## 2020-03-08 ENCOUNTER — Encounter (HOSPITAL_COMMUNITY): Payer: Self-pay | Admitting: Emergency Medicine

## 2020-03-08 ENCOUNTER — Emergency Department (HOSPITAL_COMMUNITY): Payer: Medicare Other

## 2020-03-08 ENCOUNTER — Other Ambulatory Visit: Payer: Self-pay

## 2020-03-08 ENCOUNTER — Emergency Department (HOSPITAL_COMMUNITY)
Admission: EM | Admit: 2020-03-08 | Discharge: 2020-03-08 | Disposition: A | Discharge status: Home / Self Care | Payer: Medicare Other | Attending: Emergency Medicine | Admitting: Emergency Medicine

## 2020-03-08 DIAGNOSIS — R8290 Unspecified abnormal findings in urine: Secondary | ICD-10-CM

## 2020-03-08 DIAGNOSIS — Z7952 Long term (current) use of systemic steroids: Secondary | ICD-10-CM | POA: Insufficient documentation

## 2020-03-08 DIAGNOSIS — J441 Chronic obstructive pulmonary disease with (acute) exacerbation: Secondary | ICD-10-CM | POA: Diagnosis not present

## 2020-03-08 DIAGNOSIS — R82998 Other abnormal findings in urine: Secondary | ICD-10-CM | POA: Insufficient documentation

## 2020-03-08 DIAGNOSIS — R059 Cough, unspecified: Secondary | ICD-10-CM | POA: Diagnosis present

## 2020-03-08 DIAGNOSIS — R6250 Unspecified lack of expected normal physiological development in childhood: Secondary | ICD-10-CM

## 2020-03-08 LAB — BASIC METABOLIC PANEL
Anion gap: 10 (ref 5–15)
BUN: 9 mg/dL (ref 6–20)
CO2: 24 mmol/L (ref 22–32)
Calcium: 8.8 mg/dL — ABNORMAL LOW (ref 8.9–10.3)
Chloride: 106 mmol/L (ref 98–111)
Creatinine, Ser: 0.79 mg/dL (ref 0.44–1.00)
GFR, Estimated: >60 mL/min (ref >60)
Glucose, Bld: 142 mg/dL — ABNORMAL HIGH (ref 70–99)
Potassium: 3.6 mmol/L (ref 3.5–5.1)
Sodium: 140 mmol/L (ref 135–145)

## 2020-03-08 LAB — URINALYSIS, ROUTINE W REFLEX MICROSCOPIC
Bacteria, UA: NONE SEEN
Glucose, UA: 50 mg/dL — AB
Hgb urine dipstick: NEGATIVE
Ketones, ur: 5 mg/dL — AB
Leukocytes,Ua: NEGATIVE
Nitrite: NEGATIVE
Protein, ur: 30 mg/dL — AB
Specific Gravity, Urine: 1.031 — ABNORMAL HIGH (ref 1.005–1.030)
pH: 8 (ref 5.0–8.0)

## 2020-03-08 LAB — CBC
HCT: 34.5 % — ABNORMAL LOW (ref 36.0–46.0)
Hemoglobin: 11.4 g/dL — ABNORMAL LOW (ref 12.0–15.0)
MCH: 31 pg (ref 26.0–34.0)
MCHC: 33 g/dL (ref 30.0–36.0)
MCV: 93.8 fL (ref 80.0–100.0)
Platelets: 188 10*3/uL (ref 150–400)
RBC: 3.68 MIL/uL — ABNORMAL LOW (ref 3.87–5.11)
RDW: 14.2 % (ref 11.5–15.5)
WBC: 10 10*3/uL (ref 4.0–10.5)
nRBC: 0 % (ref 0.0–0.2)

## 2020-03-08 LAB — I-STAT BETA HCG BLOOD, ED (MC, WL, AP ONLY): I-stat hCG, quantitative: <5 m[IU]/mL (ref <5)

## 2020-03-08 NOTE — ED Triage Notes (Signed)
BIB mother, mother speaks for patient and states she has a UTI and an asthma exacerbation. States she always gets UTIs, they are chronic. Used her nebulizer trt this morning and 2 doses of Pulmicort, mother endorses patient is still wheezing. Mother thinks pt might need IV abx.

## 2020-03-08 NOTE — ED Notes (Signed)
Discharge paperwork given to and reviewed with pt mother. Pt mother to drive patient home.

## 2020-03-08 NOTE — Discharge Instructions (Addendum)
1.  Follow-up with your doctor within the next 5 to 7 days. 2.  Continue all of your regularly prescribed medications and care.

## 2020-03-08 NOTE — ED Provider Notes (Signed)
[facility]-EMERGENCY DEPT Provider Note   CSN: [id number] Arrival date & time: 03/08/20  8786     History No chief complaint on file.   Lori Miles is a 24 y.o. female.  HPI Patient's mother is the historian.  She reports that she and her daughter's  managing physician at [facility] have concern for recurrent urinary tract infection.  This has been a recurrent problem.  Patient was last treated on Levaquin, mother reports about a month ago.  Patient's mother reports that the symptoms include; the patient grabbing at her genital area and development of cough and pneumonia from having a UTI.  Patient's mother reports that she is not going back to the infectious disease provider.  I had reviewed the EMR noting, that patient has recurrent positive UA but ID has recommended against repeat antibiotics due to chronicity of urine contaminate.  Patient's mother reports that she and the patient's urologist at [facility] do not agree with this assessment and she will not be going back to the ID clinic.  Last documentation was [phone] whereupon the recommendation was for no empiric antibiotic.  Patient has not been vomiting.  Patient's mother reports that she drank a Sprite in the water after getting to the emergency department.  No fever.  Mom reports that sometimes she wheezes and is concerned that that could represent a pneumonia that develops after the patient's had a UTI.  Some of the symptoms that she attributes to UTI or foul-smelling urine and the patient grabbing at her genital area.  She also reports when she has a UTI or is sick she will have increased seizure frequency.    Past Medical History:  Diagnosis Date  . Asthma   . Autism    mom states no actual dx, but admits she has some symptoms  . Bacteriuria   . Chronic constipation   . Cognitive developmental delay   . Constipation   . Disruptive behavior disorder   . Dyspnea    if she walks much  .  Family history of adverse reaction to anesthesia     "hives"-? if anesthesia did take antibiotic  . Frequency-urgency syndrome   . Gait disorder    ORGANIC PER NERUOLGIST NOTE (DR HICKLING)  . 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 GERD (gastroesophageal reflux disease)   . Gout   . H/O sinus tachycardia   . History of acute respiratory failure 02/14/2015   SECONDARY TO CAP  . History of recurrent UTIs   . Incontinent of feces   . Interstitial cystitis   . Intertrigo 11/29/2019  . Moderate intellectual disability   . OSA (obstructive sleep apnea)    no machine yet   . Partial epilepsy with impairment of consciousness (HCC)    FOLLOWED BY DR HICKLING  . Pneumonia 2    4 times this year ( 2020)  . PONV (postoperative nausea and vomiting)   . Renal cyst 11/29/2019  . Seizure (HCC)    most recent sz " at the end of summer"-last sz years ago per mother  . Urinary incontinence     Patient Active Problem List   Diagnosis Date Noted  . Renal cyst 11/29/2019  . Intertrigo 11/29/2019  . Complex care coordination 05/18/2019  . Screening for cervical cancer 02/26/2019  . Glucosuria 06/01/2018  . 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 Not well controlled moderate persistent asthma 04/29/2018  . Gastroesophageal reflux disease 04/29/2018  . Recurrent infections 04/29/2018  .  Dysuria 02/20/2018  . Chronic rhinitis 04/03/2017  . Recurrent falls 03/21/2017  . Weakness 03/21/2017  . Venous insufficiency 03/21/2017  . OSA on CPAP 09/30/2016  . Excessive daytime sleepiness 03/05/2016  . Bronchitis 05/05/2015  . 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 Cough variant asthma vs UACS/ vcd  03/15/2015  . Pneumonia 02/14/2015  . Autism spectrum disorder 02/14/2015  . Nausea with vomiting 02/14/2015  . Obesity, morbid (HCC) 12/13/2014  . 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 Sleep arousal  disorder 11/14/2014  . Acanthosis nigricans 11/14/2014  . Toeing-in 08/29/2014  . 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 Recurrent urinary tract infection 08/01/2011  . 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 Chronic constipation 08/01/2011  . Contraception 08/01/2011    Past Surgical History:  Procedure Laterality Date  . CYSTOSCOPY N/A 06/03/2017   Procedure: CYSTOSCOPY WITH EXAM UNDER ANESTHESIA;  Surgeon: Jerilee Field, MD;  Location: [facility];  Service: Urology;  Laterality: N/A;  . CYSTOSCOPY  2020  . CYSTOSCOPY WITH HYDRODISTENSION AND BIOPSY  04/14/2012   Procedure: CYSTOSCOPY/BIOPSY/HYDRODISTENSION;  Surgeon: Lindaann Slough, MD;  Location: [facility];  Service: Urology;;  instillation of marcaine and pyridium  . EUA/  PAP SMEAR/  BREAST EXAM  03-12-2016   dr Erin Fulling [facility]  . EUA/ VAGINOSCOPY/ COLPOSCOPY FO THE HYMENAL RING  10-04-2009    dr Tamela Oddi  [facility]  . IR RADIOLOGIST EVAL & MGMT  07/29/2019  . IR RADIOLOGIST EVAL & MGMT  09/30/2019  . IR SCLEROTHERAPY OF A FLUID COLLECTION  09/08/2019  . RADIOLOGY WITH ANESTHESIA N/A 09/08/2019   Procedure: IR Sclerotherapy Treatment;  Surgeon: Radiologist, Medication, MD;  Location: [facility] OR;  Service: Radiology;  Laterality: N/A;  . TONSILLECTOMY       OB History   No obstetric history on file.     Family History  Problem Relation Age of Onset  . Seizures Mother   . Seizures Sister        1 sister has  . Pancreatic cancer Sister   . Colon cancer Neg Hx   . Colon polyps Neg Hx   . Esophageal cancer Neg Hx   . Rectal cancer Neg Hx   . Stomach cancer Neg Hx     Social History   Tobacco Use  . Smoking status: Never Smoker  . Smokeless tobacco: Never Used  Vaping Use  . Vaping Use: Never used  Substance Use Topics  . Alcohol use: No    Alcohol/week: 0.0  standard drinks  . Drug use: No    Home Medications Prior to Admission medications   Medication Sig Start Date End Date Taking? Authorizing Provider  acetaminophen (TYLENOL) 325 MG tablet Take 2 tablets (650 mg total) by mouth every 6 (six) hours as needed for mild pain or moderate pain. 02/27/17   Everlene Farrier, PA-C  albuterol (PROVENTIL) (2.5 MG/3ML) 0.083% nebulizer solution USE 1 VIAL VIA NEBULIZER EVERY 4 TO 6 HOURS AS NEEDED FOR COUGH 02/07/20   Ambs, Norvel Richards, FNP  amoxicillin-clavulanate (AUGMENTIN) 875-125 MG tablet Take 1 tablet by mouth 2 (two) times daily. Patient not taking: Reported on 03/01/2020 12/27/19   [provider]  benzonatate (TESSALON) 200 MG capsule Take 200 mg by mouth 3 (three) times daily as needed. 01/24/20   [provider]  budesonide (PULMICORT) 1 MG/2ML nebulizer  solution USE 1 VIAL VIA NEBULIZER FOUR TIMES DAILY DURING RESPIRATORY INFECTIONS AND ASTHMA FLARES FOR 1-2 WEEKS 11/26/19   Ambs, Norvel Richards, FNP  cetirizine (ZYRTEC) 10 MG tablet TAKE 1 TABLET(10 MG) BY MOUTH DAILY 12/02/19   Kozlow, Alvira Philips, MD  ciprofloxacin (CIPRO) 500 MG tablet Take 500 mg by mouth 2 (two) times daily. Patient not taking: Reported on 03/01/2020 02/04/20   [provider]  clotrimazole-betamethasone (LOTRISONE) cream Apply to affected area 2 times daily prn 06/29/18   Elvina Sidle, MD  DEPAKOTE 500 MG DR tablet TAKE 1 TABLET BY MOUTH IN THE MORNING AND 2 AT NIGHT 12/02/19   Elveria Rising, NP  fluconazole (DIFLUCAN) 150 MG tablet Take 150 mg by mouth as needed (when on antibiotics).    [provider]  fluticasone (FLONASE) 50 MCG/ACT nasal spray Place 1 spray into both nostrils daily. 08/03/19   Alfonse Spruce, MD  formoterol (PERFOROMIST) 20 MCG/2ML nebulizer solution USE 2 MLS BY NEBULIZATION TWICE DAILY 01/10/20   Ambs, Norvel Richards, FNP  hydrOXYzine (ATARAX/VISTARIL) 25 MG tablet Take 25 mg by mouth 3 (three) times daily as needed (pain).   01/13/19   [provider]  Levonorgestrel-Ethinyl Estradiol (SEASONIQUE) 0.15-0.03 &0.01 MG tablet Take 1 tablet by mouth daily. 02/22/20   Sharyon Cable, CNM  melatonin 3 MG TABS tablet Take 2 tablets ( ) by mouth at bedtime. 01/28/20   Elveria Rising, NP  mometasone (NASONEX) 50 MCG/ACT nasal spray Use one spray in each nostril once daily as directed. 05/06/19   Kozlow, Alvira Philips, MD  montelukast (SINGULAIR) 10 MG tablet TAKE 1 TABLET(10 MG) BY MOUTH AT BEDTIME 12/02/19   Kozlow, Alvira Philips, MD  mupirocin nasal ointment (BACTROBAN NASAL) 2 % Place 1 application into the nose 3 (three) times daily. Use  in each nostril twice daily for twenty one (21) days. 02/08/20   Kozlow, Alvira Philips, MD  Nebulizer MISC Adult mask with tubing. Use as directed with nebulizer device. 11/18/18   Kozlow, Alvira Philips, MD  nystatin (MYCOSTATIN/NYSTOP) powder Apply 1 application topically 3 (three) times daily. 03/01/20   Vu, Gershon Mussel T, MD  ondansetron (ZOFRAN ODT) 4 MG disintegrating tablet Take 1 tablet (4 mg total) by mouth every 8 (eight) hours as needed for nausea. Patient taking differently: Take 8 mg by mouth every 8 (eight) hours as needed for nausea. 06/07/18   Garlon Hatchet, PA-C  polyethylene glycol powder (GLYCOLAX/MIRALAX) 17 GM/SCOOP powder Dissolve 17 grams (1 capful) in at least 8 ounces of water/juice and drink twice daily Patient taking differently: Take 17 g by mouth See admin instructions. Dissolve 17 grams (1 capful) in at least 8 ounces of water/juice and drink twice daily 02/15/19   Pyrtle, Carie Caddy, MD  predniSONE (DELTASONE) 20 MG tablet Take 1 tablet (20 mg total) by mouth daily with breakfast. 02/01/20   Parrett, Virgel Bouquet, NP  promethazine (PHENERGAN) 25 MG tablet Take 1 tablet (25 mg total) by mouth every 6 (six) hours as needed for nausea or vomiting. 04/28/19   Melene Plan, DO  senna (SENOKOT) 8.6 MG tablet Take 1 tablet by mouth at bedtime.     [provider]  terconazole (TERAZOL 7) 0.4 %  vaginal cream Place 1 applicator vaginally at bedtime. Patient taking differently: Place 1 applicator vaginally at bedtime as needed (yeast). 01/28/19   Sharyon Cable, CNM  traZODone (DESYREL) 50 MG tablet TAKE 2 TABLETS(100 MG) BY MOUTH AT BEDTIME 12/02/19   Elveria Rising, NP  famotidine (PEPCID) 20 MG tablet TAKE 1 TABLET(20 MG) BY MOUTH EVERY 12 HOURS 01/03/20 02/29/20  Pyrtle, Carie CaddyJay M, MD    Allergies    Abilify [aripiprazole], Seroquel [quetiapine fumarate], Amoxicillin-pot clavulanate, Bactrim [sulfamethoxazole-trimethoprim], Doxycycline, Macrobid [nitrofurantoin macrocrystal], and Keppra [levetiracetam]  Review of Systems   Review of Systems Level 5 caveat cannot obtain review of systems from patient due to cognitive\developmental delay.  All history obtained from the patient's mother.  Full review of systems from mother. Physical Exam Updated Vital Signs BP 114/83   Pulse 87   Temp 98.6 F (37 C) (Oral)   Resp 16   Ht 4' 9.5" (1.461 m)   Wt 76 kg   SpO2 93%   BMI 35.63 kg/m   Physical Exam Constitutional:      Comments: Patient is alert and nontoxic.  She is cheerful and somewhat playful.  She does not seem ill at this time.  No respiratory distress.  Behavior is very juvenile.  HENT:     Head: Normocephalic and atraumatic.     Mouth/Throat:     Mouth: Mucous membranes are moist.     Pharynx: Oropharynx is clear.  Eyes:     Extraocular Movements: Extraocular movements intact.  Cardiovascular:     Rate and Rhythm: Normal rate and regular rhythm.  Pulmonary:     Effort: Pulmonary effort is normal.     Breath sounds: Normal breath sounds.  Abdominal:     General: There is no distension.     Palpations: Abdomen is soft.     Tenderness: There is no abdominal tenderness. There is no guarding.  Musculoskeletal:        General: No swelling or tenderness. Normal range of motion.  Skin:    General: Skin is warm and dry.  Neurological:     Comments: Patient is  alert and cheerful.  He is using both upper extremities to move her blankets and reach to interact.  Speech indicates cognitive delay.     ED Results / Procedures / Treatments   Labs (all labs ordered are listed, but only abnormal results are displayed) Labs Reviewed  URINALYSIS, ROUTINE W REFLEX MICROSCOPIC - Abnormal; Notable for the following components:      Result Value   Color, Urine AMBER (*)    APPearance HAZY (*)    Specific Gravity, Urine 1.031 (*)    Glucose, UA 50 (*)    Bilirubin Urine SMALL (*)    Ketones, ur 5 (*)    Protein, ur 30 (*)    All other components within normal limits  BASIC METABOLIC PANEL - Abnormal; Notable for the following components:   Glucose, Bld 142 (*)    Calcium 8.8 (*)    All other components within normal limits  CBC - Abnormal; Notable for the following components:   RBC 3.68 (*)    Hemoglobin 11.4 (*)    HCT 34.5 (*)    All other components within normal limits  URINE CULTURE  I-STAT BETA HCG BLOOD, ED ([facility], [facility], [facility] ONLY)    EKG None  Radiology DG Chest Port 1 View  Result Date: 03/08/2020 CLINICAL DATA:  Cough EXAM: PORTABLE CHEST 1 VIEW COMPARISON:  January 20, 2020 FINDINGS: Lungs are clear. Heart size and pulmonary vascularity are normal. No adenopathy. No bone lesions. IMPRESSION: Lungs clear.  Cardiac silhouette normal. Electronically Signed   By: Bretta BangWilliam  Woodruff III M.D.   On: 03/08/2020 14:36    Procedures Procedures (including critical care time)  Medications Ordered in ED Medications - No data to display  ED Course  I have reviewed the triage vital signs and the nursing notes.  Pertinent labs & imaging results that were available during my care of the patient were reviewed by me and considered in my medical decision making (see chart for details).    MDM Rules/Calculators/A&P                         Recheck: Patient continues to be well in appearance.  She is playing with a computer game.  She is cheerful and  laughing.  No signs of acute illness.  Patient brought in by her mother for concerns of possible UTI.  This has been a frequent concern.  Today's urinalysis is clear without any signs of infection.  Patient's mother also expressed concern for pneumonia.  Clinically, patient is well in appearance.  No signs of respiratory distress with clear lungs.  Chest x-ray is clear.  At this time patient stable for continued monitoring by her regular outpatient providers.  Final Clinical Impression(s) / ED Diagnoses Final diagnoses:  Developmental delay disorder  Cough  Bad odor of urine    Rx / DC Orders ED Discharge Orders    None       Arby Barrette, MD 03/08/20 1544

## 2020-03-08 NOTE — ED Notes (Signed)
Pt. Attempted to urinate, but unable to provide enough for urine sample at this time.

## 2020-03-09 LAB — URINE CULTURE: Culture: <10000 — AB

## 2020-03-14 ENCOUNTER — Other Ambulatory Visit: Payer: Self-pay | Admitting: Family Medicine

## 2020-03-16 ENCOUNTER — Other Ambulatory Visit: Payer: Self-pay

## 2020-03-16 ENCOUNTER — Telehealth: Payer: Self-pay | Admitting: Allergy and Immunology

## 2020-03-16 DIAGNOSIS — B999 Unspecified infectious disease: Secondary | ICD-10-CM

## 2020-03-16 MED ORDER — NYSTATIN NICU ORAL SYRINGE 100,000 UNITS/ML: OROMUCOSAL | 0 refills | Status: DC

## 2020-03-16 NOTE — Telephone Encounter (Signed)
Patient mom called and said that she is still sick. She is wheezing really bad. And she is coughing.she may have a sinus infection. She has a sore throat from all the drainage and she needs antibiotic. [facility] on [address] [phone].

## 2020-03-16 NOTE — Telephone Encounter (Signed)
Please inform Lori Miles's mom that we are not going to administer any additional antibiotics or steroids.  It is quite possible that she may have some fungal overgrowth and we will call out nystatin 5 mL swish and swallow 3 times a day for 10 days.

## 2020-03-16 NOTE — Telephone Encounter (Signed)
Please advise to possible sinus infection

## 2020-03-16 NOTE — Telephone Encounter (Signed)
Called and spoke to mom and informed her of the message. She agreed with mouth wash. She also would like Dr. Lucie Leather to discuss her daughters symptoms with his colleagues due to her having nose  bleeds, wheezing, and being agitated and lacking sleep. Mom is demanding that  an antibiotic be sent in for her daughter in fear of her condition being fatal. Please advise.

## 2020-03-18 DIAGNOSIS — U071 COVID-19: Secondary | ICD-10-CM

## 2020-03-18 HISTORY — DX: COVID-19: U07.1

## 2020-03-20 NOTE — Telephone Encounter (Signed)
Please inform mom that her last chest x-ray did not identify anything wrong with her lungs suggesting an infection.  We can obtain a limited sinus CT scan to look for a chronic infection of her sinuses.  Lets get that arranged.

## 2020-03-20 NOTE — Telephone Encounter (Signed)
Patient mother is believes her daughter is having symptoms of pneumonia and sinus infection. They will get the CT done but is strongly urging to get the antibiotics to have on hands since patient been having her symptoms since November. Patient's mother will seek further evaluation if necessary to get her daughter better if she believes we are not resolving her daughters issues.

## 2020-03-20 NOTE — Telephone Encounter (Signed)
Called mom to inform her of the note per Dr. Lucie Leather. Mom initially refused the CT Scan due to her not getting a sinus surgery. I informed mom that this CT Scan would be for Dr. Lucie Leather to rule out chronic infection of her sinus'. Mom still denied the CT Scan and said the Dr. Lucie Leather knows her daughter's history and knows she needs an antibiotic due to her still wheezing since before the Thanksgiving holiday of 2021. I informed her that I would inform Dr. Lucie Leather that she denied the CT Scan. Mom then changed her mind and stated that if Dr. Lucie Leather ordered the CT Scan then she would try it but she knows that the CT Scan is not going to show what's going on with her wheezing and stated that she needed a antibiotic. Mom further stated that she took Destany to the ED and was told to follow up with her PCP. I asked mom if she had made that appointment or is she had been seen since the ED visit and mom denied that she did due to her PCP not wanting to deal with her UTI conditions. I advised mom that she should follow up with her PCP per her ED notes. I informed mom that she could take her daughter to the imaging center anytime tomorrow so that her CT Scan could be completed. Mom verbally agreed.   Mom called back and is wanting to know if her daughter can get a chest Xray as well to rule out pneumonia as well as other conditions due to [facility] still not being herself and that the Xray that Dr. Lucie Leather looked at was old. I informed mom that she had an Xray on Dec. 22. 21 and I believed that was the Xray he was referring to but I'd ask him to confirm and let her know his response. Mom verbally agreed that she did have the Xray and voiced that she thinks something was missed on that picture and insist Dr. Lucie Leather should order another Xray to see if the pneumonia was just starting to develop at the time of her first Xray and due to the picture being taken early a true diagnosis of pneumonia or something explaining her symptoms  couldn't be found but it will show up in the next Xray if he orders a new one.  Please advise.

## 2020-03-20 NOTE — Telephone Encounter (Signed)
Please inform Lori Miles's mom that we want to limit her exposure to radiation including x-rays just like we want to limit her exposure to steroids and antibiotics.  It does not appear as though her situation requires radiation or steroids or antibiotics at this point in time other than to obtain a sinus CT scan as her last sinus CT scan was in 2017.  Her chest x-ray of 07 March 2020 is adequate in assessing her for lung infection.

## 2020-03-21 ENCOUNTER — Other Ambulatory Visit: Payer: Self-pay

## 2020-03-21 DIAGNOSIS — Z049 Encounter for examination and observation for unspecified reason: Secondary | ICD-10-CM | POA: Diagnosis not present

## 2020-03-21 DIAGNOSIS — Z3041 Encounter for surveillance of contraceptive pills: Secondary | ICD-10-CM

## 2020-03-21 DIAGNOSIS — R569 Unspecified convulsions: Secondary | ICD-10-CM | POA: Diagnosis not present

## 2020-03-21 MED ORDER — LEVONORGEST-ETH ESTRAD 91-DAY 0.15-0.03 &0.01 MG PO TABS: 1.0000 | ORAL_TABLET | Freq: Every day | ORAL | 3 refills | Status: DC

## 2020-03-21 NOTE — Telephone Encounter (Signed)
Refills on birth control pills. Patient will reschedule appointment today

## 2020-03-22 DIAGNOSIS — R059 Cough, unspecified: Secondary | ICD-10-CM | POA: Diagnosis not present

## 2020-03-22 DIAGNOSIS — N390 Urinary tract infection, site not specified: Secondary | ICD-10-CM | POA: Diagnosis not present

## 2020-03-23 ENCOUNTER — Telehealth: Payer: Self-pay

## 2020-03-23 ENCOUNTER — Other Ambulatory Visit: Payer: Self-pay

## 2020-03-23 ENCOUNTER — Inpatient Hospital Stay: Admission: RE | Admit: 2020-03-23 | Payer: Medicare Other | Source: Ambulatory Visit

## 2020-03-23 ENCOUNTER — Ambulatory Visit
Admission: RE | Admit: 2020-03-23 | Discharge: 2020-03-23 | Disposition: A | Discharge status: Home / Self Care | Payer: Medicare Other | Source: Ambulatory Visit | Attending: Allergy and Immunology | Admitting: Allergy and Immunology

## 2020-03-23 DIAGNOSIS — N390 Urinary tract infection, site not specified: Secondary | ICD-10-CM | POA: Diagnosis not present

## 2020-03-23 DIAGNOSIS — B999 Unspecified infectious disease: Secondary | ICD-10-CM

## 2020-03-23 DIAGNOSIS — J329 Chronic sinusitis, unspecified: Secondary | ICD-10-CM | POA: Diagnosis not present

## 2020-03-23 NOTE — Telephone Encounter (Signed)
I called mom and confirmed that patient did get the correct CT scan completed. Kayla corrected my error while mom and patient were at the imaging site. I did apologize to mom for the confusion and error. Mom verbalized understanding and was appreciative of the correction and that the scan was completed same day and time. I advised mom to call if there were future concerns or questions and mom verbally agreed.

## 2020-03-23 NOTE — Telephone Encounter (Signed)
Order correction for ct sinus

## 2020-03-23 NOTE — Addendum Note (Signed)
Addended by: Mliss Fritz I on: 03/23/2020 08:46 AM   Modules accepted: Orders

## 2020-03-23 NOTE — Telephone Encounter (Signed)
I called mom and confirmed that patient did get the correct CT scan completed. Kayla corrected my error while mom and patient were at the imaging site. I did apologize to mom for the confusion and error. Mom verbalized understanding and was appreciative of the correction and that the scan was completed same day and time. I advised mom to call if there were future concerns or questions and mom verbally agreed. 

## 2020-03-26 ENCOUNTER — Emergency Department (HOSPITAL_COMMUNITY)
Admission: EM | Admit: 2020-03-26 | Discharge: 2020-03-27 | Disposition: A | Discharge status: Home / Self Care | Payer: Medicare Other | Attending: Emergency Medicine | Admitting: Emergency Medicine

## 2020-03-26 ENCOUNTER — Other Ambulatory Visit: Payer: Self-pay

## 2020-03-26 ENCOUNTER — Emergency Department (HOSPITAL_COMMUNITY): Payer: Medicare Other

## 2020-03-26 ENCOUNTER — Encounter (HOSPITAL_COMMUNITY): Payer: Self-pay

## 2020-03-26 DIAGNOSIS — R059 Cough, unspecified: Secondary | ICD-10-CM | POA: Diagnosis not present

## 2020-03-26 DIAGNOSIS — Z7952 Long term (current) use of systemic steroids: Secondary | ICD-10-CM | POA: Diagnosis not present

## 2020-03-26 DIAGNOSIS — R062 Wheezing: Secondary | ICD-10-CM | POA: Diagnosis not present

## 2020-03-26 DIAGNOSIS — F840 Autistic disorder: Secondary | ICD-10-CM | POA: Diagnosis not present

## 2020-03-26 DIAGNOSIS — R0981 Nasal congestion: Secondary | ICD-10-CM | POA: Diagnosis not present

## 2020-03-26 DIAGNOSIS — J449 Chronic obstructive pulmonary disease, unspecified: Secondary | ICD-10-CM | POA: Diagnosis not present

## 2020-03-26 DIAGNOSIS — R000 Tachycardia, unspecified: Secondary | ICD-10-CM | POA: Diagnosis not present

## 2020-03-26 DIAGNOSIS — R509 Fever, unspecified: Secondary | ICD-10-CM | POA: Diagnosis not present

## 2020-03-26 LAB — CBC WITH DIFFERENTIAL/PLATELET
Abs Immature Granulocytes: 0.36 10*3/uL — ABNORMAL HIGH (ref 0.00–0.07)
Basophils Absolute: 0 10*3/uL (ref 0.0–0.1)
Basophils Relative: 1 %
Eosinophils Absolute: 0 10*3/uL (ref 0.0–0.5)
Eosinophils Relative: 0 %
HCT: 37.5 % (ref 36.0–46.0)
Hemoglobin: 12.4 g/dL (ref 12.0–15.0)
Immature Granulocytes: 6 %
Lymphocytes Relative: 31 %
Lymphs Abs: 1.9 10*3/uL (ref 0.7–4.0)
MCH: 30.8 pg (ref 26.0–34.0)
MCHC: 33.1 g/dL (ref 30.0–36.0)
MCV: 93.3 fL (ref 80.0–100.0)
Monocytes Absolute: 1.2 10*3/uL — ABNORMAL HIGH (ref 0.1–1.0)
Monocytes Relative: 20 %
Neutro Abs: 2.6 10*3/uL (ref 1.7–7.7)
Neutrophils Relative %: 42 %
Platelets: 191 10*3/uL (ref 150–400)
RBC: 4.02 MIL/uL (ref 3.87–5.11)
RDW: 14.2 % (ref 11.5–15.5)
WBC: 6.1 10*3/uL (ref 4.0–10.5)
nRBC: 0 % (ref 0.0–0.2)

## 2020-03-26 LAB — COMPREHENSIVE METABOLIC PANEL
ALT: 53 U/L — ABNORMAL HIGH (ref 0–44)
AST: 63 U/L — ABNORMAL HIGH (ref 15–41)
Albumin: 3.3 g/dL — ABNORMAL LOW (ref 3.5–5.0)
Alkaline Phosphatase: 40 U/L (ref 38–126)
Anion gap: 11 (ref 5–15)
BUN: 12 mg/dL (ref 6–20)
CO2: 22 mmol/L (ref 22–32)
Calcium: 8.9 mg/dL (ref 8.9–10.3)
Chloride: 104 mmol/L (ref 98–111)
Creatinine, Ser: 0.91 mg/dL (ref 0.44–1.00)
GFR, Estimated: >60 mL/min (ref >60)
Glucose, Bld: 88 mg/dL (ref 70–99)
Potassium: 3.7 mmol/L (ref 3.5–5.1)
Sodium: 137 mmol/L (ref 135–145)
Total Bilirubin: 0.3 mg/dL (ref 0.3–1.2)
Total Protein: 6.6 g/dL (ref 6.5–8.1)

## 2020-03-26 MED ORDER — ACETAMINOPHEN 325 MG PO TABS
650.0000 mg | ORAL_TABLET | Freq: Once | ORAL | Status: AC | PRN
  Administered 2020-03-26: 650 mg via ORAL
  Filled 2020-03-26: qty 2

## 2020-03-26 MED ORDER — SODIUM CHLORIDE 0.9 % IV BOLUS
1000.0000 mL | Freq: Once | INTRAVENOUS | Status: AC
  Administered 2020-03-26: 1000 mL via INTRAVENOUS

## 2020-03-26 MED ORDER — ALBUTEROL (5 MG/ML) CONTINUOUS INHALATION SOLN
10.0000 mg/h | INHALATION_SOLUTION | Freq: Once | RESPIRATORY_TRACT | Status: AC
  Administered 2020-03-26: 10 mg/h via RESPIRATORY_TRACT
  Filled 2020-03-26: qty 20

## 2020-03-26 NOTE — ED Provider Notes (Signed)
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 Review of chart shows multiple outpatient office visits with heart rate from 72 to 120 on multiple encounters.   She can be discharged home, mom agrees with discharge. Encouraged to follow up with pulmonology and/or PCP.    Elpidio Anis, PA-C 03/27/20 0015    Rozelle Logan, DO 03/29/20 1841

## 2020-03-26 NOTE — ED Notes (Signed)
Patients Heart rate went up to 170s after Albuterol treatment. Patel PA notified. Patients mother wants to leave and says "this is her normal and they can still go home." Altha PA told patients mother that she needs further testing done with fluids.

## 2020-03-26 NOTE — ED Triage Notes (Signed)
Patient's mother reports that she has been taking her daughter to see her PCP and pulmonologist since Thanksgiving, but continues to wheeze. Patient's mother states that she is currently taking Levaquin. sats in triage- 100%.  Patient's mother reports that her doctor does not want her to have a steroid, but mother states "that is what she needs."

## 2020-03-26 NOTE — ED Notes (Addendum)
Patient refused Covid test. Patients mother said that her daughter is not allowed to have anything up her nose due to "bad sinuses and nasal polyps that will bleed non stop if anything is put up her nose."

## 2020-03-26 NOTE — ED Provider Notes (Signed)
[facility]-EMERGENCY DEPT Provider Note   CSN: [id number] Arrival date & time: 03/26/20  0756     History Chief Complaint  Patient presents with  . Fever  . Wheezing    Lori Miles is a 25 y.o. female with pertinent past medical history of autism, seizure, disruptive behavior disorder, asthma that presents to the emergency department today for continuous wheezing.  Patient is brought in by mom who is primary caretaker and is the primary historian.  Patient is mainly nonverbal.  Mom states that they brought her in due to continuous wheezing which she has had since Thanksgiving.  States that she has had congestion and facial pain, has been asking her PCP for antibiotics for multiple days, states that they just started Levaquin for the past couple of days.  Has been taking this.  Mom states that this has not been helping and she has still had her wheezing and congestion.  States that she did contact her allergy and immunologist, mom states that she came here requesting steroids and nebulizer at this time.  Rejecting COVID swab.  Denies any fevers, chills. Did have fever here today 100.4 during triage.  Denies any sick contacts.  Has not been vaccinated against COVID.  No cough. No other complaints.  Per chart review, patient is on extensive medications for asthma including genocide, piriformis, montelukast.  Patient has been compliant with all of these. Per chart review patient has been trying to contact her allergy and immunologist, Dr. Lucie Leather who did not recommend any additional antibiotics or steroids at this time, also did not recommend repeat chest x-ray.  Mom states that she had to bring her to the emergency department because she was not satisfied with the care that he was providing.  States that she has been having the symptoms since Thanksgiving, has been taking her home Pulmicort without any relief.  States that she is also been battling a sinus infection.  Recent  CT 3 days ago did not show any sinus infection. HPI     Past Medical History:  Diagnosis Date  . Asthma   . Autism    mom states no actual dx, but admits she has some symptoms  . Bacteriuria   . Chronic constipation   . Cognitive developmental delay   . Constipation   . Disruptive behavior disorder   . Dyspnea    if she walks much  . Family history of adverse reaction to anesthesia     "hives"-? if anesthesia did take antibiotic  . Frequency-urgency syndrome   . Gait disorder    ORGANIC PER NERUOLGIST NOTE (DR HICKLING)  . 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 GERD (gastroesophageal reflux disease)   . Gout   . H/O sinus tachycardia   . History of acute respiratory failure 02/14/2015   SECONDARY TO CAP  . History of recurrent UTIs   . Incontinent of feces   . Interstitial cystitis   . Intertrigo 11/29/2019  . Moderate intellectual disability   . OSA (obstructive sleep apnea)    no machine yet   . Partial epilepsy with impairment of consciousness (HCC)    FOLLOWED BY DR HICKLING  . Pneumonia 2    4 times this year ( 2020)  . PONV (postoperative nausea and vomiting)   . Renal cyst 11/29/2019  . Seizure (HCC)    most recent sz " at the end of summer"-last sz years ago per mother  . Urinary incontinence  Patient Active Problem List   Diagnosis Date Noted  . Renal cyst 11/29/2019  . Intertrigo 11/29/2019  . Complex care coordination 05/18/2019  . Screening for cervical cancer 02/26/2019  . Glucosuria 06/01/2018  . 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 Not well controlled moderate persistent asthma 04/29/2018  . Gastroesophageal reflux disease 04/29/2018  . Recurrent infections 04/29/2018  . Dysuria 02/20/2018  . Chronic rhinitis 04/03/2017  . Recurrent falls 03/21/2017  . Weakness 03/21/2017  . Venous insufficiency 03/21/2017  . OSA on CPAP 09/30/2016  . Excessive daytime sleepiness 03/05/2016  . Bronchitis 05/05/2015  .  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 Cough variant asthma vs UACS/ vcd  03/15/2015  . Pneumonia 02/14/2015  . Autism spectrum disorder 02/14/2015  . Nausea with vomiting 02/14/2015  . Obesity, morbid (HCC) 12/13/2014  . 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 Acanthosis nigricans 11/14/2014  . Toeing-in 08/29/2014  . 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 Recurrent urinary tract infection 08/01/2011  . 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 Chronic constipation 08/01/2011  . Contraception 08/01/2011    Past Surgical History:  Procedure Laterality Date  . CYSTOSCOPY N/A 06/03/2017   Procedure: CYSTOSCOPY WITH EXAM UNDER ANESTHESIA;  Surgeon: Jerilee FieldEskridge, [name], MD;  [facility];  Service: Urology;  Laterality: N/A;  . CYSTOSCOPY  2020  . CYSTOSCOPY WITH HYDRODISTENSION AND BIOPSY  04/14/2012   Procedure: CYSTOSCOPY/BIOPSY/HYDRODISTENSION;  Surgeon: Lindaann SloughMarc-Henry Nesi, MD;  [facility] [place];  Service: Urology;;  instillation of marcaine and pyridium  . EUA/  PAP SMEAR/  BREAST EXAM  03-12-2016   dr [facility]  . EUA/ VAGINOSCOPY/ COLPOSCOPY FO THE HYMENAL RING  10-04-2009    [facility]  . IR RADIOLOGIST EVAL & MGMT  07/29/2019  . IR RADIOLOGIST EVAL & MGMT  09/30/2019  . IR SCLEROTHERAPY OF A FLUID COLLECTION  09/08/2019  . RADIOLOGY WITH ANESTHESIA N/A 09/08/2019   Procedure: IR Sclerotherapy Treatment;  Surgeon: Radiologist, Medication, MD;  Location: [facility] OR;  Service: Radiology;  Laterality: N/A;  . TONSILLECTOMY       OB History   No obstetric history on file.     Family History  Problem Relation Age of Onset  . Seizures Mother   . Seizures Sister        1 sister has  . Pancreatic cancer  Sister   . Colon cancer Neg Hx   . Colon polyps Neg Hx   . Esophageal cancer Neg Hx   . Rectal cancer Neg Hx   . Stomach cancer Neg Hx     Social History   Tobacco Use  . Smoking status: Never Smoker  . Smokeless tobacco: Never Used  Vaping Use  . Vaping Use: Never used  Substance Use Topics  . Alcohol use: No    Alcohol/week: 0.0 standard drinks  . Drug use: No    Home Medications Prior to Admission medications   Medication Sig Start Date End Date Taking? Authorizing Provider  acetaminophen (TYLENOL) 325 MG tablet Take 2 tablets (650 mg total) by mouth every 6 (six) hours as needed for mild pain or moderate pain. 02/27/17   Everlene Farrieransie, William, PA-C  albuterol (PROVENTIL) (2.5 MG/3ML) 0.083% nebulizer solution USE 1 VIAL VIA NEBULIZER EVERY 4 TO 6 HOURS AS NEEDED FOR COUGH  02/07/20   Hetty Blend, FNP  amoxicillin-clavulanate (AUGMENTIN) 875-125 MG tablet Take 1 tablet by mouth 2 (two) times daily. Patient not taking: Reported on 03/01/2020 12/27/19   [provider]  benzonatate (TESSALON) 200 MG capsule Take 200 mg by mouth 3 (three) times daily as needed. 01/24/20   [provider]  budesonide (PULMICORT) 1 MG/2ML nebulizer solution USE 1 VIAL VIA NEBULIZER FOUR TIMES DAILY DURING RESPIRATORY INFECTIONS AND ASTHMA FLARES FOR 1-2 WEEKS 11/26/19   Ambs, Norvel Richards, FNP  cetirizine (ZYRTEC) 10 MG tablet TAKE 1 TABLET(10 MG) BY MOUTH DAILY 12/02/19   Kozlow, Alvira Philips, MD  ciprofloxacin (CIPRO) 500 MG tablet Take 500 mg by mouth 2 (two) times daily. Patient not taking: Reported on 03/01/2020 02/04/20   [provider]  clotrimazole-betamethasone (LOTRISONE) cream Apply to affected area 2 times daily prn 06/29/18   Elvina Sidle, MD  DEPAKOTE 500 MG DR tablet TAKE 1 TABLET BY MOUTH IN THE MORNING AND 2 AT NIGHT 12/02/19   Elveria Rising, NP  fluconazole (DIFLUCAN) 150 MG tablet Take 150 mg by mouth as needed (when on antibiotics).    [provider]   fluticasone (FLONASE) 50 MCG/ACT nasal spray Place 1 spray into both nostrils daily. 08/03/19   Alfonse Spruce, MD  hydrOXYzine (ATARAX/VISTARIL) 25 MG tablet Take 25 mg by mouth 3 (three) times daily as needed (pain).  01/13/19   [provider]  Levonorgestrel-Ethinyl Estradiol (SEASONIQUE) 0.15-0.03 &0.01 MG tablet Take 1 tablet by mouth daily. 03/21/20   Sharyon Cable, CNM  melatonin 3 MG TABS tablet Take 2 tablets (6mg ) by mouth at bedtime. 01/28/20   13/12/21, NP  mometasone (NASONEX) 50 MCG/ACT nasal spray Use one spray in each nostril once daily as directed. 05/06/19   Kozlow, 05/08/19, MD  montelukast (SINGULAIR) 10 MG tablet TAKE 1 TABLET(10 MG) BY MOUTH AT BEDTIME 12/02/19   Kozlow, 12/04/19, MD  mupirocin nasal ointment (BACTROBAN NASAL) 2 % Place 1 application into the nose 3 (three) times daily. Use  in each nostril twice daily for twenty one (21) days. 02/08/20   Kozlow, 02/10/20, MD  Nebulizer MISC Adult mask with tubing. Use as directed with nebulizer device. 11/18/18   Kozlow, 01/18/19, MD  nystatin (MYCOSTATIN) 100000 UNITS/ML SUSP swish and swallow 3 times a day for 10 days. 03/16/20   Kozlow, 03/18/20, MD  nystatin (MYCOSTATIN/NYSTOP) powder Apply 1 application topically 3 (three) times daily. 03/01/20   Vu, 03/03/20 T, MD  ondansetron (ZOFRAN ODT) 4 MG disintegrating tablet Take 1 tablet (4 mg total) by mouth every 8 (eight) hours as needed for nausea. Patient taking differently: Take 8 mg by mouth every 8 (eight) hours as needed for nausea. 06/07/18   06/09/18, PA-C  PERFOROMIST 20 MCG/2ML nebulizer solution USE 1 VIAL VIA NEBULIZER TWICE DAILY 03/14/20   Ambs, 03/16/20, FNP  polyethylene glycol powder (GLYCOLAX/MIRALAX) 17 GM/SCOOP powder Dissolve 17 grams (1 capful) in at least 8 ounces of water/juice and drink twice daily Patient taking differently: Take 17 g by mouth See admin instructions. Dissolve 17 grams (1 capful) in at least 8 ounces of water/juice and  drink twice daily 02/15/19   Pyrtle, 02/17/19, MD  predniSONE (DELTASONE) 20 MG tablet Take 1 tablet (20 mg total) by mouth daily with breakfast. 02/01/20   Parrett, 02/03/20, NP  promethazine (PHENERGAN) 25 MG tablet Take 1 tablet (25 mg total) by mouth every 6 (six) hours as  needed for nausea or vomiting. 04/28/19   Melene Plan, DO  senna (SENOKOT) 8.6 MG tablet Take 1 tablet by mouth at bedtime.     [provider]  terconazole (TERAZOL 7) 0.4 % vaginal cream Place 1 applicator vaginally at bedtime. Patient taking differently: Place 1 applicator vaginally at bedtime as needed (yeast). 01/28/19   Sharyon Cable, CNM  traZODone (DESYREL) 50 MG tablet TAKE 2 TABLETS(100 MG) BY MOUTH AT BEDTIME 12/02/19   Elveria Rising, NP  famotidine (PEPCID) 20 MG tablet TAKE 1 TABLET(20 MG) BY MOUTH EVERY 12 HOURS 01/03/20 02/29/20  Pyrtle, Carie Caddy, MD    Allergies    Abilify [aripiprazole], Seroquel [quetiapine fumarate], Amoxicillin-pot clavulanate, Bactrim [sulfamethoxazole-trimethoprim], Doxycycline, Macrobid [nitrofurantoin macrocrystal], and Keppra [levetiracetam]  Review of Systems   Review of Systems  Constitutional: Negative for chills, diaphoresis, fatigue and fever.  HENT: Positive for congestion. Negative for facial swelling, rhinorrhea, sinus pressure, sinus pain, sneezing, sore throat and trouble swallowing.   Eyes: Negative for pain and visual disturbance.  Respiratory: Positive for wheezing. Negative for cough and shortness of breath.   Cardiovascular: Negative for chest pain, palpitations and leg swelling.  Gastrointestinal: Negative for abdominal distention, abdominal pain, diarrhea, nausea and vomiting.  Genitourinary: Negative for difficulty urinating.  Musculoskeletal: Negative for back pain, neck pain and neck stiffness.  Skin: Negative for pallor.  Neurological: Negative for dizziness, speech difficulty, weakness and headaches.  Psychiatric/Behavioral: Negative for confusion.     Physical Exam Updated Vital Signs BP 110/72 (BP Location: Right Arm)   Pulse (!) 120   Temp 99.2 F (37.3 C) (Oral)   Resp 15   Ht 4' 9.5" (1.461 m)   Wt 76 kg   SpO2 99%   BMI 35.63 kg/m   Physical Exam Constitutional:      General: She is not in acute distress.    Appearance: Normal appearance. She is not ill-appearing, toxic-appearing or diaphoretic.     Comments: Patient without acute respiratory stress.  Patient is sitting comfortably in bed, no tripoding, use of accessory muscles.  Patient is nonverbal, appears well.  HENT:     Head: Normocephalic and atraumatic.     Jaw: There is normal jaw occlusion. No trismus, swelling or malocclusion.     Nose: Congestion present. No rhinorrhea.     Right Sinus: No maxillary sinus tenderness or frontal sinus tenderness.     Left Sinus: No maxillary sinus tenderness or frontal sinus tenderness.     Mouth/Throat:     Mouth: Mucous membranes are moist. No oral lesions.     Dentition: Normal dentition.     Tongue: No lesions.     Palate: No mass and lesions.     Pharynx: Oropharynx is clear. Uvula midline. No pharyngeal swelling, oropharyngeal exudate, posterior oropharyngeal erythema or uvula swelling.     Tonsils: No tonsillar exudate or tonsillar abscesses. 1+ on the right. 1+ on the left.     Comments: Patient without tonsillar enlargement or exudate.  No signs of peritonsillar abscess, palate without any tenderness or masses palpated.  No swelling under the tongue, uvula is midline without any inflammation. Eyes:     General: No visual field deficit or scleral icterus.       Right eye: No discharge.        Left eye: No discharge.     Extraocular Movements: Extraocular movements intact.     Conjunctiva/sclera: Conjunctivae normal.     Pupils: Pupils are equal, round, and reactive to  light.  Cardiovascular:     Rate and Rhythm: Normal rate and regular rhythm.     Pulses: Normal pulses.     Heart sounds: Normal heart sounds.  No murmur heard. No friction rub. No gallop.   Pulmonary:     Effort: Pulmonary effort is normal. No respiratory distress.     Breath sounds: No stridor. Wheezing (Mild expiratory wheezes heard throughout all lung fields) present. No rhonchi or rales.  Chest:     Chest wall: No tenderness.  Abdominal:     General: Abdomen is flat. Bowel sounds are normal. There is no distension.     Palpations: Abdomen is soft.     Tenderness: There is no abdominal tenderness. There is no right CVA tenderness, left CVA tenderness, guarding or rebound.  Musculoskeletal:        General: No swelling or tenderness. Normal range of motion.     Cervical back: Normal range of motion and neck supple. No rigidity or tenderness.     Right lower leg: No edema.     Left lower leg: No edema.  Lymphadenopathy:     Cervical: No cervical adenopathy.  Skin:    General: Skin is warm and dry.     Capillary Refill: Capillary refill takes less than 2 seconds.     Coloration: Skin is not pale.     Findings: No erythema or rash.  Neurological:     General: No focal deficit present.     Mental Status: She is alert and oriented to person, place, and time.     Cranial Nerves: Cranial nerves are intact. No cranial nerve deficit or facial asymmetry.     Motor: Motor function is intact. No weakness.     Coordination: Coordination is intact.     Gait: Gait is intact. Gait normal.  Psychiatric:        Mood and Affect: Mood normal.        Behavior: Behavior normal.     ED Results / Procedures / Treatments   Labs (all labs ordered are listed, but only abnormal results are displayed) Labs Reviewed  CBC WITH DIFFERENTIAL/PLATELET - Abnormal; Notable for the following components:      Result Value   Monocytes Absolute 1.2 (*)    Abs Immature Granulocytes 0.36 (*)    All other components within normal limits  COMPREHENSIVE METABOLIC PANEL - Abnormal; Notable for the following components:   Albumin 3.3 (*)    AST 63 (*)     ALT 53 (*)    All other components within normal limits  SARS CORONAVIRUS 2 (TAT 6-24 HRS)    EKG EKG Interpretation  Date/Time:  Sunday March 26 2020 22:28:47 EST Ventricular Rate:  166 PR Interval:    QRS Duration: 80 QT Interval:  275 QTC Calculation: 457 R Axis:   68 Text Interpretation: Sinus tachycardia Paired ventricular premature complexes Aberrant complex Nonspecific repol abnormality, inferior leads 12 Lead; [name]-Likar sinus tachycardia, no STEMI Confirmed by Coralee PesaHorton, Kristie [id number]) on 03/26/2020 10:39:38 PM   Radiology DG Chest 2 View  Result Date: 03/26/2020 CLINICAL DATA:  Cough EXAM: CHEST - 2 VIEW COMPARISON:  March 08, 2020 FINDINGS: The cardiomediastinal silhouette is normal in contour. Low lung volumes. No pleural effusion. No pneumothorax. No acute pleuroparenchymal abnormality. Visualized abdomen is unremarkable. No acute osseous abnormality noted. IMPRESSION: No acute cardiopulmonary abnormality. Electronically Signed   By: Meda KlinefelterStephanie  Peacock MD   On: 03/26/2020 11:21    Procedures Procedures (including critical care  time)  Medications Ordered in ED Medications  acetaminophen (TYLENOL) tablet 650 mg (650 mg Oral Given 03/26/20 0901)  albuterol (PROVENTIL,VENTOLIN) solution continuous neb (10 mg/hr Nebulization Given 03/26/20 2117)  sodium chloride 0.9 % bolus 1,000 mL (1,000 mLs Intravenous New Bag/Given 03/26/20 2309)    ED Course  I have reviewed the triage vital signs and the nursing notes.  Pertinent labs & imaging results that were available during my care of the patient were reviewed by me and considered in my medical decision making (see chart for details).    MDM Rules/Calculators/A&P                          Myeasha Ballowe Puglia is a 25 y.o. female with pertinent past medical history of autism, seizure, disruptive behavior disorder, asthma that presents to the emergency department today for continuous wheezing.  Patient does have some mild wheezing on  exam, otherwise appears well. No auditory wheezing.  Patient does also have some tachycardia to 111 with temperature of 100.4 in triage, after Tylenol given temperature down to 99.2, however patient still slightly tachycardic to 109.  Mom requesting nebulizer, states that she does not want albuterol inhaler since they have been trying that at home without any relief.  They are rejecting COVID swab at this time.  Chest x-ray without any acute cardiopulmonary disease, CBC without any leukocytosis CBC and CMP unremarkable.  Nursing informed by nursing that patient wanted to leave mid continuous neb, patient has been here unfortunately for 14 hours in the waiting room.  When I went to go reevaluate patient, her heart rate was 165.  EKG performed which shows sinus tachycardia.  Did discuss with mom that she would be signing out AMA if she left and we need to evaluate her sinus tachycardia further. Did discuss risks of this with mom.  Mom states that this normally occurs when she has her albuterol.  Will give IV fluids at this time and reevaluate, did discuss that I do not feel comfortable with patient leaving with this tachycardia, I am unsure if albuterol would cause tachycardia this high.  Otherwise patient still appears well.  Dr. Wilkie Aye did also evaluate the patient.  Think this is mostly a viral syndrome, however if tachycardia does not improve I think we could proceed with CT PE study.   Pt care was handed off to S. Upstill PA-C at shift change.  Complete history and physical and current plan have been communicated.  Please refer to their note for the remainder of ED care and ultimate disposition.  Patient will need reevaluation after fluid bolus in an hour.  I discussed this case with my attending physician who cosigned this note including patient's presenting symptoms, physical exam, and planned diagnostics and interventions. Attending physician stated agreement with plan or made changes to plan which  were implemented.   Attending physician assessed patient at bedside.  Final Clinical Impression(s) / ED Diagnoses Final diagnoses:  Wheezing    Rx / DC Orders ED Discharge Orders    None       Farrel Gordon, PA-C 03/26/20 2355    Rozelle Logan, DO 03/27/20 0013

## 2020-03-27 ENCOUNTER — Telehealth: Payer: Self-pay | Admitting: Pulmonary Disease

## 2020-03-27 ENCOUNTER — Telehealth: Payer: Self-pay | Admitting: Cardiology

## 2020-03-27 ENCOUNTER — Telehealth: Payer: Self-pay | Admitting: Allergy

## 2020-03-27 NOTE — Telephone Encounter (Signed)
Patient went to the ED yesterday. Per mother, they gave her too much albuterol which caused her HR to jump up. She is calling to speak with Dr. Elberta Fortis or Sherri in regards to this. Patient has not been seen since 2017. States she called our answering service and was told she would receive a call back but never heard anything. I advised that technically Elga would be considered a NP so I wasn't sure if we could really do anything. She stated she was told that she can call anytime and speak with Sherri or Dr. Elberta Fortis. Please advise.

## 2020-03-27 NOTE — Telephone Encounter (Signed)
Patient's mother called back stating her x-ray was clear. Patient is still wheezing. ED gave her albuterol which caused her heart rate to jump "because they gave her too much". Mother states patient's lung doctor directed her to call our office for recommendations.  Please advise.

## 2020-03-27 NOTE — Telephone Encounter (Signed)
Spoke with pt's mother. She reports that pt went to ED yesterday for increased wheezing,  nasal congestion, prod cough (? Color) and had fever of 104 at ED. Pt did not get Covid tested due to not being able to have nasal swab due to nasal polyps. Pt did have cxr at ER and was given a Albuterol neb treatment. PT is currently on Levaquin for Sinus infection (Dr Lucie Leather) and UTI. PT is taking Perfomist and Pulmicort neb twice daily and Albuterol neb as needed. PT is not on Prednisone at this time.  Pt has NOT had Covid vaccine.  Please advise. [employer] [employer]

## 2020-03-27 NOTE — Telephone Encounter (Signed)
03/27/20  I am sorry to hear the patient's mother just agrees with my recommendations.  I believe it is completely reasonable to follow-up with allergy asthma who is currently treating the patient with Levaquin.  If the patient sinuses are fine I am unsure why she is on Levaquin for management of a sinus infection per her mother.  It appears that Dr. Lucie Leather documented on 03/27/2020 at 1528 that he would like to have the patient see him in the office tomorrow for a visit.  I agree with that recommendation.  I would also be cautious given the fact that she has an elevated temperature and is unvaccinated COVID-19.  Once again would recommend that if symptoms are worsening she needs to present to emergency room for further evaluation.  The patient's mother continues to disagree with above recommendations then please let her know that this triage message can be sent to Dr. Craige Cotta and the patient will have to wait until Dr. Craige Cotta is available to answer this when he is back in clinic.  [name] Headland, FNP

## 2020-03-27 NOTE — Telephone Encounter (Signed)
03/27/2020  Would recommend the patient follow-up with allergy asthma regarding her symptoms given that they are currently treating her for sinus infection.  If symptoms worsen present back to the emergency room.  [name] Headland, FNP

## 2020-03-27 NOTE — Telephone Encounter (Signed)
Please inform Lori Miles's mom that she should make sure Lori Miles is using nebulized budesonide 4 times per day until this viral induced flareup is over.  Usually they last somewhere between 7 to 10 days.

## 2020-03-27 NOTE — Telephone Encounter (Signed)
Patient has been placed on the schedule to see Dr. Lucie Leather tomorrow at 8:30, ok'd per Dr. Lucie Leather.

## 2020-03-27 NOTE — Discharge Instructions (Addendum)
Continue regular treatments at home and follow up with your doctor, pulmonologist and/or PCP, for recheck if symptoms persist.   Return to the emergency department with any new or worsening symptoms.

## 2020-03-27 NOTE — Telephone Encounter (Signed)
Physician on call--Late entry  Called by the mother of Lori Miles on 03/26/2020 afternoon.  She called to tell me that she was with [facility] in the [facility] ED as she was having persistent wheezing and had a fever up to 104.  She states they were still waiting for ED room and had been there since 7 AM in the morning.  She had already had a chest x-ray did not know the results of this and have been given antipyretics for her fever. Mother wanted me to let Dr. Lucie Leather know that they needed to go to the ED.

## 2020-03-27 NOTE — Telephone Encounter (Signed)
I called and spoke with the pt's mother, [name] and notified of response and she verbalized understanding.

## 2020-03-27 NOTE — Telephone Encounter (Signed)
Forwarding to [name] Headland, NP as Dr Craige Cotta is out of the office today.

## 2020-03-27 NOTE — Telephone Encounter (Signed)
Please advise 

## 2020-03-27 NOTE — Telephone Encounter (Signed)
I spoke to patients mom and she stated that she has been doing budesonide 4 times per day and this has been going on since Thanksgiving 01/2020. She states she looks bad, isn't eating, heart is beating really fast, she's wheezing really bad. Can't have Covid test because she can't put anything in her nose. Had a Tempeture of 104 was given two tylenol in the ER and it came down. A cardiologist called mom asking why her heart rate is so high, and the mom stated she was given too much nebulizer medication because they gave her a little more than normal because of all the wheezing she was doing.

## 2020-03-27 NOTE — Telephone Encounter (Signed)
Pt last seen 2019 by Dr. Elberta Fortis... pt recently being treated for UTI and Sinus infection.. pt has had 104 fever and significant sinus symptoms.. pt tested for COVID yesterday no result yet... pt had HR elevated to 166 on EKG in Epic seen by Pulmonary and her PCP... I advised her to continue to monitor and be sure she is getting enough fluids and once her Levaquin kicks in her HR should improve but until then she should rest and stay in close communication with her PCP... pts mom agrees.   Will advise Dr. Elberta Fortis. Sherri for review.

## 2020-03-27 NOTE — Telephone Encounter (Signed)
Have her come by the office tomorrow for a visit.

## 2020-03-27 NOTE — Telephone Encounter (Signed)
ATC patient's mother. Unable to leave vm due to mailbox being full.  Will call back.

## 2020-03-27 NOTE — Telephone Encounter (Signed)
Spoke to patient's mother, [name](DPR) and relayed below recommendations.  [name] would like Dr. Evlyn Courier recommendations. She is aware that Dr. Craige Cotta will be back in office tomorrow. She is okay with waiting on a response.   Dr. Craige Cotta, please advise. Thanks

## 2020-03-27 NOTE — Telephone Encounter (Signed)
Called and spoke to patient's Mother, [name](DPR). [name] called back and is not agreeable with previous recommendations(please see duplicate phone call). She stated that patient's sinuses are fine. Patient's current sx are more lung related vs allergy. Patient is experiencing sob with exertion and laying flat, wheezing, fever of 104 yesterday, sore throat, nasal drainage (unsure of color) and productive cough with yellow mucus. Sx have been present since thanksgiving.  Per [name], patient was prescribed Levaquin for UTI and sinus infection.    [name], please advise. Thanks

## 2020-03-28 ENCOUNTER — Telehealth: Payer: Self-pay | Admitting: Pulmonary Disease

## 2020-03-28 ENCOUNTER — Encounter: Payer: Self-pay | Admitting: Allergy and Immunology

## 2020-03-28 ENCOUNTER — Other Ambulatory Visit: Payer: Self-pay

## 2020-03-28 ENCOUNTER — Ambulatory Visit (INDEPENDENT_AMBULATORY_CARE_PROVIDER_SITE_OTHER): Payer: Medicare Other | Admitting: Allergy and Immunology

## 2020-03-28 VITALS — BP 110/70 | HR 100 | Temp 98.7°F | Resp 16

## 2020-03-28 DIAGNOSIS — K219 Gastro-esophageal reflux disease without esophagitis: Secondary | ICD-10-CM | POA: Diagnosis not present

## 2020-03-28 DIAGNOSIS — J3089 Other allergic rhinitis: Secondary | ICD-10-CM | POA: Diagnosis not present

## 2020-03-28 DIAGNOSIS — U071 COVID-19: Secondary | ICD-10-CM | POA: Diagnosis not present

## 2020-03-28 DIAGNOSIS — J4540 Moderate persistent asthma, uncomplicated: Secondary | ICD-10-CM | POA: Diagnosis not present

## 2020-03-28 NOTE — Patient Instructions (Addendum)
  1. Continue a combination of the following:   A. budesonide 1 mg nebulized twice a day  B. Perforomist nebulized twice a day  C. Nasal saline 2 times per day  D. montelukast 10 mg daily  E. cetirizine 10 mg daily  2. Continue to Treat reflux with a combination of the following:   A. Famotidine 40mg  twice a day  B. No caffeine or chocolate consumption  3. "Action plan" for asthma flare up:   A. increase budesonide to 4 times a day  4. Continue albuterol nebulization every 4-6 hours if needed  5. For this recent COVID infection:   A. Prednisone 10 mg - 1 tablet 1 time per day for 10 days only  B. Refer to monoclonal antibody infusion center  C. Do not remove mask in public  6.  Further treatment?

## 2020-03-28 NOTE — Telephone Encounter (Signed)
Spoke with pt's mother, aware of recs.  Nothing further needed at this time- will close encounter.

## 2020-03-28 NOTE — Telephone Encounter (Signed)
Agree with recommendations as detailed by [name] Headland.    It appears she was seen by Dr. Lucie Leather earlier today, and started on prednisone and referred to infusion center for monoclonal antibody therapy.

## 2020-03-28 NOTE — Progress Notes (Unsigned)
[place]   Follow-up Note  Referring Provider: Scifres, Lori Cellaorothy, PA-C Primary Provider: Karlyne GreenspanScifres, [name], PA-C Date of Office Visit: 03/28/2020  Subjective:   Lori Miles (DOB: 31-Oct-1995) is a 25 y.o. female who [facility] in re-evaluation of the following:  HPI: Lori CellaJasmine returns to this clinic in evaluation of asthma and reflux and a history of recurrent infections.  Her last visit to this clinic was 29 February 2020.  Lori Miles's most recent issue deals with the fact that she has developed a febrile illness over the course of the past several days with wheezing requiring her to go to the emergency room for evaluation at which point in time no additional therapy was administered.  She still continues to have wheezing and she still continues to have chills and feeling feverish.    She has not had any COVID vaccinations.  Allergies as of 03/28/2020      Reactions   Abilify [aripiprazole] Swelling, Palpitations   Seroquel [quetiapine Fumarate] Palpitations   Amoxicillin-pot Clavulanate Hives, Other (See Comments)   Has patient had a PCN reaction causing immediate rash, facial/tongue/throat swelling, SOB or lightheadedness with hypotension: No Has patient had a PCN reaction causing severe rash involving mucus membranes or skin necrosis:    #  #  YES  #  #  Has patient had a PCN reaction that required hospitalization: No Has patient had a PCN reaction occurring within the last 10 years: No   Bactrim [sulfamethoxazole-trimethoprim] Other (See Comments)   Abdominal pain   Doxycycline Other (See Comments)   Stomach cramps   Macrobid [nitrofurantoin Macrocrystal] Other (See Comments)   Sharp pain/ given with Tylenol 325 mg   Keppra [levetiracetam] Other (See Comments)   Aggressive behavior       Medication List      acetaminophen 325 MG tablet Commonly known as: TYLENOL Take 2  tablets (650 mg total) by mouth every 6 (six) hours as needed for mild pain or moderate pain.   albuterol (2.5 MG/3ML) 0.083% nebulizer solution Commonly known as: PROVENTIL USE 1 VIAL VIA NEBULIZER EVERY 4 TO 6 HOURS AS NEEDED FOR COUGH   benzonatate 200 MG capsule Commonly known as: TESSALON Take 200 mg by mouth 3 (three) times daily as needed.   budesonide 1 MG/2ML nebulizer solution Commonly known as: PULMICORT USE 1 VIAL VIA NEBULIZER FOUR TIMES DAILY DURING RESPIRATORY INFECTIONS AND ASTHMA FLARES FOR 1-2 WEEKS   cetirizine 10 MG tablet Commonly known as: ZYRTEC TAKE 1 TABLET(10 MG) BY MOUTH DAILY   ciprofloxacin 500 MG tablet Commonly known as: CIPRO Take 500 mg by mouth 2 (two) times daily.   clotrimazole-betamethasone cream Commonly known as: LOTRISONE Apply to affected area 2 times daily prn   Depakote 500 MG DR tablet Generic drug: divalproex TAKE 1 TABLET BY MOUTH IN THE MORNING AND 2 AT NIGHT   fluconazole 150 MG tablet Commonly known as: DIFLUCAN Take 150 mg by mouth as needed (when on antibiotics).   fluticasone 50 MCG/ACT nasal spray Commonly known as: Flonase Place 1 spray into both nostrils daily.   hydrOXYzine 25 MG tablet Commonly known as: ATARAX/VISTARIL Take 25 mg by mouth 3 (three) times daily as needed (pain).   Levonorgestrel-Ethinyl Estradiol 0.15-0.03 &0.01 MG tablet Commonly known as: Seasonique Take 1 tablet by mouth daily.   melatonin 3 MG Tabs tablet Take 2 tablets (6mg ) by mouth at bedtime.   mometasone  50 MCG/ACT nasal spray Commonly known as: NASONEX Use one spray in each nostril once daily as directed.   montelukast 10 MG tablet Commonly known as: SINGULAIR TAKE 1 TABLET(10 MG) BY MOUTH AT BEDTIME   Nebulizer Misc Adult mask with tubing. Use as directed with nebulizer device.   nystatin 100000 UNITS/ML Susp Commonly known as: MYCOSTATIN swish and swallow 3 times a day for 10 days.   nystatin powder Commonly known as:  MYCOSTATIN/NYSTOP Apply 1 application topically 3 (three) times daily.   ondansetron 4 MG disintegrating tablet Commonly known as: Zofran ODT Take 1 tablet (4 mg total) by mouth every 8 (eight) hours as needed for nausea. What changed: how much to take   Perforomist 20 MCG/2ML nebulizer solution Generic drug: formoterol USE 1 VIAL VIA NEBULIZER TWICE DAILY   polyethylene glycol powder 17 GM/SCOOP powder Commonly known as: GLYCOLAX/MIRALAX Dissolve 17 grams (1 capful) in at least 8 ounces of water/juice and drink twice daily   promethazine 25 MG tablet Commonly known as: PHENERGAN Take 1 tablet (25 mg total) by mouth every 6 (six) hours as needed for nausea or vomiting.   senna 8.6 MG tablet Commonly known as: SENOKOT Take 1 tablet by mouth at bedtime.   terconazole 0.4 % vaginal cream Commonly known as: TERAZOL 7 Place 1 applicator vaginally at bedtime.   traZODone 50 MG tablet Commonly known as: DESYREL TAKE 2 TABLETS(100 MG) BY MOUTH AT BEDTIME       Past Medical History:  Diagnosis Date  . Asthma   . Autism    mom states no actual dx, but admits she has some symptoms  . Bacteriuria   . Chronic constipation   . Cognitive developmental delay   . Constipation   . Disruptive behavior disorder   . Dyspnea    if she walks much  . Family history of adverse reaction to anesthesia     "hives"-? if anesthesia did take antibiotic  . Frequency-urgency syndrome   . Gait disorder    ORGANIC PER NERUOLGIST NOTE (DR HICKLING)  . 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 GERD (gastroesophageal reflux disease)   . Gout   . H/O sinus tachycardia   . History of acute respiratory failure 02/14/2015   SECONDARY TO CAP  . History of recurrent UTIs   . Incontinent of feces   . Interstitial cystitis   . Intertrigo 11/29/2019  . Moderate intellectual disability   . OSA (obstructive sleep apnea)    no machine yet   . Partial epilepsy with impairment of  consciousness (HCC)    FOLLOWED BY DR HICKLING  . Pneumonia 2    4 times this year ( 2020)  . PONV (postoperative nausea and vomiting)   . Renal cyst 11/29/2019  . Seizure (HCC)    most recent sz " at the end of summer"-last sz years ago per mother  . Urinary incontinence     Past Surgical History:  Procedure Laterality Date  . CYSTOSCOPY N/A 06/03/2017   Procedure: CYSTOSCOPY WITH EXAM UNDER ANESTHESIA;  Surgeon: Jerilee Field, MD;  Location: [facility];  Service: Urology;  Laterality: N/A;  . CYSTOSCOPY  2020  . CYSTOSCOPY WITH HYDRODISTENSION AND BIOPSY  04/14/2012   Procedure: CYSTOSCOPY/BIOPSY/HYDRODISTENSION;  Surgeon: Lindaann Slough, MD;  Location: [facility];  Service: Urology;;  instillation of marcaine and pyridium  . EUA/  PAP SMEAR/  BREAST EXAM  03-12-2016   dr Erin Fulling [facility]  . EUA/ VAGINOSCOPY/  COLPOSCOPY FO THE HYMENAL RING  10-04-2009    dr Tamela Oddi  [facility]  . IR RADIOLOGIST EVAL & MGMT  07/29/2019  . IR RADIOLOGIST EVAL & MGMT  09/30/2019  . IR SCLEROTHERAPY OF A FLUID COLLECTION  09/08/2019  . RADIOLOGY WITH ANESTHESIA N/A 09/08/2019   Procedure: IR Sclerotherapy Treatment;  Surgeon: Radiologist, Medication, MD;  Location: [facility];  Service: Radiology;  Laterality: N/A;  . TONSILLECTOMY      Review of systems negative except as noted in HPI / PMHx or noted below:  Review of Systems  Constitutional: Negative.   HENT: Negative.   Eyes: Negative.   Respiratory: Negative.   Cardiovascular: Negative.   Gastrointestinal: Negative.   Genitourinary: Negative.   Musculoskeletal: Negative.   Skin: Negative.   Neurological: Negative.   Endo/Heme/Allergies: Negative.   Psychiatric/Behavioral: Negative.      Objective:   Vitals:   03/28/20 0834  BP: 110/70  Pulse: 100  Resp: 16  Temp: 98.7 F (37.1 C)  SpO2: 99%          Physical Exam Constitutional:      Appearance: She is not diaphoretic.  HENT:     Head:  Normocephalic.     Right Ear: Tympanic membrane, ear canal and external ear normal.     Left Ear: Tympanic membrane, ear canal and external ear normal.     Nose: Nose normal. No mucosal edema or rhinorrhea.     Mouth/Throat:     Mouth: Oropharynx is clear and moist and mucous membranes are normal.     Pharynx: Uvula midline. No oropharyngeal exudate.  Eyes:     Conjunctiva/sclera: Conjunctivae normal.  Neck:     Thyroid: No thyromegaly.     Trachea: Trachea normal. No tracheal tenderness or tracheal deviation.  Cardiovascular:     Rate and Rhythm: Normal rate and regular rhythm.     Heart sounds: Normal heart sounds, S1 normal and S2 normal. No murmur heard.   Pulmonary:     Effort: No respiratory distress.     Breath sounds: Normal breath sounds. No stridor. No wheezing (Bilateral end expiratory wheezes) or rales.  Musculoskeletal:        General: No edema.  Lymphadenopathy:     Head:     Right side of head: No tonsillar adenopathy.     Left side of head: No tonsillar adenopathy.     Cervical: No cervical adenopathy.  Skin:    Findings: No erythema or rash.     Nails: There is no clubbing.  Neurological:     Mental Status: She is alert.     Diagnostics: Abbott BinaxNow swab test POSITIVE  Assessment and Plan:   1. COVID-19 virus infection   2. Not well controlled moderate persistent asthma   3. Other allergic rhinitis   4. Gastroesophageal reflux disease, unspecified whether esophagitis present     1. Continue a combination of the following:   A. budesonide 1 mg nebulized twice a day  B. Perforomist nebulized twice a day  C. Nasal saline 2 times per day  D. montelukast 10 mg daily  E. cetirizine 10 mg daily  2. Continue to Treat reflux with a combination of the following:   A. Famotidine 40mg  twice a day  B. No caffeine or chocolate consumption  3. "Action plan" for asthma flare up:   A. increase budesonide to 4 times a day  4. Continue albuterol  nebulization every 4-6 hours if needed  5. For this recent  COVID infection:   A. Prednisone 10 mg - 1 tablet 1 time per day for 10 days only  B. Refer to monoclonal antibody infusion center  C. Do not remove mask in public  6.  Further treatment?  Aidah appears to be very early in her COVID infection.  Given the fact that she has had some difficult to control asthma in the past I think she would be a candidate for monoclonal antibody infusion and we will refer her on to the infusion center.  As well, we tested her mother who arrived with her today and she is also positive for COVID and I have asked her to contact her primary care doctor regarding further evaluation of this infection.  Currently her mother has no symptoms at all.  Laurette Schimke, MD Allergy / Immunology [facility]

## 2020-03-28 NOTE — Telephone Encounter (Signed)
Spoke with Patient's Mother Lori Miles.  Patient was seen this morning per Mother by Allergy/Asthma. [name] stated Patient was checked for Covid and is positive.  Allergy/Asthma treating Patient and recommended antibody transfusion.

## 2020-03-29 ENCOUNTER — Encounter: Payer: Self-pay | Admitting: Allergy and Immunology

## 2020-03-29 ENCOUNTER — Telehealth: Payer: Self-pay | Admitting: Family

## 2020-03-29 NOTE — Telephone Encounter (Signed)
Case reviewed by Dr. Kendrick Fries. As she is >7 days from symptom onset - prescribed antibiotics by primary care on 03/22/20 due to sinus symptoms, does not qualify for MAB at this time.   Relayed recommendations to her caregiver her mother. Her mother is very adamant that they "just got covid in the ED" and thinks is adamant we should use that testing date as date of symptoms. Discussed that we have to use first date of known symptoms as MAB is not effective on later symptom dates.   Lori Sorrow, NP

## 2020-03-29 NOTE — Telephone Encounter (Signed)
Called to discuss with patient about COVID-19 symptoms and the use of one of the available treatments for those with mild to moderate Covid symptoms and at a high risk of hospitalization.  Pt appears to qualify for outpatient treatment due to co-morbid conditions and/or a member of an at-risk group in accordance with the FDA Emergency Use Authorization.    Spoke with patient's caretaker and legal guardian, her mother [name]. Very difficult to determine timing of symptoms.   Symptom onset: 03/22/2020 (difficult to determine) Vaccinated: No Booster? No Qualifiers: moderate persistent asthma, ethnicity  Patient was started on Levaquin by her primary care provider 03/22/2020 due to sinus infection. Seen in ED 03/26/2020 with reported respiratory symptoms of wheezing since Thanksiving - patient's mother refused COVID swab. Seen by Dr. Lucie Leather 03/28/20 and patient as well as her mother tested positive for COVID19. Paitnet started on Prednisone 10mg  daily x 10 days and referred to MAB.   On the phone mother is adamant that they got COVID from the ED and that the ED should "separate out the people with COVID" and describes spraying people in the ED with Lysol. She is adamant that they need this treatment though appears patient symptoms have been ongoing >7 days. Will review with MD.   

## 2020-03-30 ENCOUNTER — Telehealth: Payer: Self-pay | Admitting: Pulmonary Disease

## 2020-03-30 ENCOUNTER — Telehealth: Payer: Self-pay | Admitting: *Deleted

## 2020-03-30 NOTE — Telephone Encounter (Signed)
Called and spoke to pt's mother. She states she was informed that pt does not fit the criteria for the MAB infusion. Advised her to follow the other recommendations made by [name] Headland, NP. [name] verbalized understanding and denied any further questions or concerns at this time.

## 2020-03-30 NOTE — Telephone Encounter (Signed)
Called and spoke to patients mom and she verbally expressed understanding. Mom said that she is also give her pearls as well to help with the cough. Mom said that she is monitoring her very closely and that she is still not eating and turning red. I informed mom that I would get the information to Dr. Lucie Leather and to call back if she had further concerns. Mom verbally agreed and said that she would kep [name] updated on Tykeshia's progression.

## 2020-03-30 NOTE — Telephone Encounter (Addendum)
Called and spoke to pt's mother, informed her of the recs per Dr. Craige Cotta. She states she does not agree with this and will look to get the infusion elsewhere. Advised pt's mother, pt to seek emergency care if any new or worsening s/s develop. Nothing further needed at this time.

## 2020-03-30 NOTE — Telephone Encounter (Signed)
Lori Miles called this morning to give Lori Miles an update on Lori Miles.  Lori Miles states she contacted regarding Monoclonal antibody infusion and both she and Lori Miles were told they did not qualify for the infusion.  Per Lori Miles, they did not qualify for infusion at this time since they were being treated for sinus infection and on an antibiotic since 03/22/20.  Lori Miles states Lori Miles had fever last night, but got better with fever reducing medication.  Lori Miles did not take temperature, so she does not know the exact temp.  Lori Miles does have some wheezing and is taking her Prednisone and using Albuterol and Pulmicort neb solution.  She is vomiting off and on and trying to keep liquids down.  Lori Miles does not have a pulse ox at the house to check oxygen levels. Informed Lori Miles I would send note to Lori Miles for update.  Lori Miles voiced understanding.

## 2020-03-30 NOTE — Telephone Encounter (Signed)
Called and spoke to pt's mother, Lori Miles. She states she would like Dr. Craige Miles to speak with Dr. Kendrick Miles about Lori Miles, the patient,  getting the MAB infusion. Advised the mother that there are strict guidelines that are followed for pt's to receive the infusion and how it is ineffective if received without fitting the criteria. Per phone note from Lori Shields, NP, from 1.12.22 the pt, Lori Miles, does not fit the criteria. I informed the pts mother that even though she feels Lori Miles needs the infusion does not mean she fits the criteria to receive it. The pt's mother became agitated on the phone and said 'just send him the message and do what I ask you to do.'.   Dr. Craige Miles please advise. Thanks.

## 2020-03-30 NOTE — Telephone Encounter (Signed)
Please inform Lori Miles's mom that everything she relates to Lori Miles concerning Lori Miles actually sounds pretty favorable.  Hopefully within 5 to 7 days most of this will be over.  Continue on the prednisone and continue on the budesonide 4 times per day and make sure she uses famotidine twice a day as well as her other medications.  She can use ibuprofen or acetaminophen for fever.  We usually favor the use of ibuprofen over acetaminophen if she can tolerate this medicine.

## 2020-03-30 NOTE — Telephone Encounter (Signed)
Reviewed chart.  She is outside the therapeutic window for monoclonal antibody therapy and would not be indicated at this time.  She should continue budesonide bid and complete prednisone therapy prescribed by Dr. Lucie Leather.

## 2020-03-31 DIAGNOSIS — J4520 Mild intermittent asthma, uncomplicated: Secondary | ICD-10-CM | POA: Diagnosis not present

## 2020-04-03 ENCOUNTER — Telehealth: Payer: Self-pay | Admitting: Pulmonary Disease

## 2020-04-03 NOTE — Telephone Encounter (Signed)
04/03/20  Received telephone call on triage line regarding patient.  Patient mother reported that the questions were regarding her own personal care not for Jasemine.  Reinforced that if symptoms worsen pt needs to see emergent care at the ER  Nothing further needed  [name] Headland, FNP

## 2020-04-10 DIAGNOSIS — R399 Unspecified symptoms and signs involving the genitourinary system: Secondary | ICD-10-CM | POA: Insufficient documentation

## 2020-04-10 DIAGNOSIS — R3982 Chronic bladder pain: Secondary | ICD-10-CM | POA: Diagnosis not present

## 2020-04-14 ENCOUNTER — Other Ambulatory Visit: Payer: Self-pay | Admitting: Family Medicine

## 2020-04-14 ENCOUNTER — Other Ambulatory Visit: Payer: Self-pay | Admitting: Allergy and Immunology

## 2020-04-17 DIAGNOSIS — G4700 Insomnia, unspecified: Secondary | ICD-10-CM | POA: Diagnosis not present

## 2020-04-17 DIAGNOSIS — R300 Dysuria: Secondary | ICD-10-CM | POA: Diagnosis not present

## 2020-04-17 DIAGNOSIS — E7800 Pure hypercholesterolemia, unspecified: Secondary | ICD-10-CM | POA: Diagnosis not present

## 2020-04-19 DIAGNOSIS — Z049 Encounter for examination and observation for unspecified reason: Secondary | ICD-10-CM | POA: Diagnosis not present

## 2020-04-19 DIAGNOSIS — R569 Unspecified convulsions: Secondary | ICD-10-CM | POA: Diagnosis not present

## 2020-04-22 ENCOUNTER — Other Ambulatory Visit: Payer: Self-pay | Admitting: Internal Medicine

## 2020-04-25 ENCOUNTER — Encounter: Payer: Self-pay | Admitting: *Deleted

## 2020-04-28 ENCOUNTER — Other Ambulatory Visit (INDEPENDENT_AMBULATORY_CARE_PROVIDER_SITE_OTHER): Payer: Self-pay | Admitting: Family

## 2020-04-28 DIAGNOSIS — R531 Weakness: Secondary | ICD-10-CM

## 2020-04-28 DIAGNOSIS — F840 Autistic disorder: Secondary | ICD-10-CM

## 2020-04-28 DIAGNOSIS — R269 Unspecified abnormalities of gait and mobility: Secondary | ICD-10-CM

## 2020-04-28 DIAGNOSIS — R296 Repeated falls: Secondary | ICD-10-CM

## 2020-04-28 DIAGNOSIS — F819 Developmental disorder of scholastic skills, unspecified: Secondary | ICD-10-CM

## 2020-05-02 DIAGNOSIS — J4520 Mild intermittent asthma, uncomplicated: Secondary | ICD-10-CM | POA: Diagnosis not present

## 2020-05-03 ENCOUNTER — Encounter: Payer: Self-pay | Admitting: Internal Medicine

## 2020-05-03 ENCOUNTER — Ambulatory Visit (INDEPENDENT_AMBULATORY_CARE_PROVIDER_SITE_OTHER): Payer: Medicare Other | Admitting: Internal Medicine

## 2020-05-03 VITALS — BP 114/76 | HR 86 | Ht <= 58 in | Wt 158.0 lb

## 2020-05-03 DIAGNOSIS — K5909 Other constipation: Secondary | ICD-10-CM | POA: Diagnosis not present

## 2020-05-03 DIAGNOSIS — K219 Gastro-esophageal reflux disease without esophagitis: Secondary | ICD-10-CM | POA: Diagnosis not present

## 2020-05-03 MED ORDER — FAMOTIDINE 20 MG PO TABS: ORAL_TABLET | ORAL | 3 refills | Status: DC

## 2020-05-03 NOTE — Patient Instructions (Signed)
We have sent the following medications to your pharmacy for you to pick up at your convenience: Famotidine 20 mg twice daily  If you are age 26 or younger, your body mass index should be between 19-25. Your Body mass index is 33.6 kg/m. If this is out of the aformentioned range listed, please consider follow up with your Primary Care Provider.   Due to recent changes in healthcare laws, you may see the results of your imaging and laboratory studies on MyChart before your provider has had a chance to review them.  We understand that in some cases there may be results that are confusing or concerning to you. Not all laboratory results come back in the same time frame and the provider may be waiting for multiple results in order to interpret others.  Please give [name] 48 hours in order for your provider to thoroughly review all the results before contacting the office for clarification of your results.

## 2020-05-03 NOTE — Progress Notes (Signed)
   Subjective:    Patient ID: Lori Miles, female    DOB: 01-31-1996, 25 y.o.   MRN: [id number]  HPI Lori Miles is a 25 year old female with a history of GERD, history of constipation, seizure disorder, frequent UTI, sleep apnea, autism who is seen for follow-up.  She is here today with her mother.  She was last seen 01/21/2019.  Her mother reports that she has had ongoing issues with intermittent UTIs and is being treated for 1 now.  UTIs are frequently for her associated with fever and this can lower her seizure threshold.  Her last seizure was just a few days ago over the weekend.  She contracted COVID-19 in January but recovered uneventfully.  Famotidine is helping to control her acid reflux and indigestion symptoms.  Her mother noticed that she really needs this twice a day to get complete control of the symptoms.  No dysphagia or odynophagia.  Bowel movements have been mostly regular of late.  They do use senna to help with bowel movements on occasion.  MiraLAX also helps with bowel movement   Review of Systems As per HPI, otherwise negative  Current Medications, Allergies, Past Medical History, Past Surgical History, Family History and Social History were reviewed in [employer] record.     Objective:   Physical Exam BP 114/76   Pulse 86   Ht 4' 9.5" (1.461 m)   Wt 158 lb (71.7 kg)   BMI 33.60 kg/m  Gen: awake, alert, NAD HEENT: anicteric Abd: soft, nt/nd, +bs Neuro: nonfocal      Assessment & Plan:  25 year old female with a history of GERD, history of constipation, seizure disorder, frequent UTI, sleep apnea, autism who is seen for follow-up.  1.  GERD with history of chronic cough --currently well controlled with twice daily famotidine, continue current therapy --Famotidine 20 mg twice daily  2.  History of chronic constipation --well controlled with MiraLAX and senna, continue current therapy  1 to 2-year follow-up, sooner if  needed  20 minutes total spent today including patient facing time, coordination of care, reviewing medical history/procedures/pertinent radiology studies, and documentation of the encounter.

## 2020-05-08 ENCOUNTER — Telehealth (INDEPENDENT_AMBULATORY_CARE_PROVIDER_SITE_OTHER): Payer: Self-pay | Admitting: Family

## 2020-05-08 NOTE — Telephone Encounter (Signed)
  Who's calling (name and relationship to patient) : Lori Miles (mom)  Best contact number: [phone]  Provider they see: Elveria Rising  Reason for call: Mom states that she sent Lori Miles an e-mail a couple of weeks ago requesting a call back and has not heard anything. Requests call back.    PRESCRIPTION REFILL ONLY  Name of prescription:  Pharmacy:

## 2020-05-08 NOTE — Telephone Encounter (Signed)
I called but did not receive an answer or option for voicemail. I will try to call Mom tomorrow. TG

## 2020-05-09 ENCOUNTER — Encounter: Payer: Self-pay | Admitting: Allergy and Immunology

## 2020-05-09 ENCOUNTER — Ambulatory Visit (INDEPENDENT_AMBULATORY_CARE_PROVIDER_SITE_OTHER): Payer: Medicare Other | Admitting: Allergy and Immunology

## 2020-05-09 ENCOUNTER — Other Ambulatory Visit: Payer: Self-pay | Admitting: Allergy and Immunology

## 2020-05-09 ENCOUNTER — Other Ambulatory Visit: Payer: Self-pay

## 2020-05-09 VITALS — BP 98/60 | HR 110 | Temp 98.0°F | Ht <= 58 in | Wt 167.6 lb

## 2020-05-09 DIAGNOSIS — K219 Gastro-esophageal reflux disease without esophagitis: Secondary | ICD-10-CM | POA: Diagnosis not present

## 2020-05-09 DIAGNOSIS — J3089 Other allergic rhinitis: Secondary | ICD-10-CM | POA: Diagnosis not present

## 2020-05-09 DIAGNOSIS — J4540 Moderate persistent asthma, uncomplicated: Secondary | ICD-10-CM | POA: Diagnosis not present

## 2020-05-09 MED ORDER — MOMETASONE FUROATE 50 MCG/ACT NA SUSP: NASAL | 1 refills | Status: DC

## 2020-05-09 MED ORDER — ALBUTEROL SULFATE (2.5 MG/3ML) 0.083% IN NEBU: 2.5000 mg | INHALATION_SOLUTION | RESPIRATORY_TRACT | 1 refills | Status: DC | PRN

## 2020-05-09 MED ORDER — CETIRIZINE HCL 10 MG PO TABS: ORAL_TABLET | ORAL | 5 refills | Status: DC

## 2020-05-09 MED ORDER — BUDESONIDE 1 MG/2ML IN SUSP: RESPIRATORY_TRACT | 5 refills | Status: DC

## 2020-05-09 MED ORDER — FAMOTIDINE 20 MG PO TABS: ORAL_TABLET | ORAL | 3 refills | Status: DC

## 2020-05-09 MED ORDER — FORMOTEROL FUMARATE 20 MCG/2ML IN NEBU: INHALATION_SOLUTION | RESPIRATORY_TRACT | 5 refills | Status: DC

## 2020-05-09 MED ORDER — ALBUTEROL SULFATE HFA 108 (90 BASE) MCG/ACT IN AERS: 2.0000 | INHALATION_SPRAY | Freq: Four times a day (QID) | RESPIRATORY_TRACT | 1 refills | Status: DC | PRN

## 2020-05-09 MED ORDER — MONTELUKAST SODIUM 10 MG PO TABS: ORAL_TABLET | ORAL | 5 refills | Status: DC

## 2020-05-09 NOTE — Patient Instructions (Addendum)
  1. Continue a combination of the following:   A. budesonide 1 mg nebulized twice a day  B. Perforomist nebulized twice a day  C. Nasal saline 2 times per day  D. montelukast 10 mg daily  E. cetirizine 10 mg daily  2. Continue to Treat reflux with a combination of the following:   A. Famotidine 40mg  twice a day  B. No caffeine or chocolate consumption  3. "Action plan" for asthma flare up:   A. increase budesonide to 4 times a day  4. Continue albuterol nebulization every 4-6 hours if needed  5. Return to clinic in 6 months or earlier if problem

## 2020-05-09 NOTE — Telephone Encounter (Signed)
Mom returned Lori Miles's call.  [phone] is the correct number in the chart.

## 2020-05-09 NOTE — Telephone Encounter (Signed)
I tried to call Mom but did not receive answer or option for voicemail. I sent her an email. TG

## 2020-05-09 NOTE — Telephone Encounter (Signed)
I called and spoke to Mom. She wanted to verify that a PT referral was put in place for [facility] and I confirmed that I had done so. TG

## 2020-05-09 NOTE — Progress Notes (Signed)
[place] [facility] [place] [facility] [place] [facility] [place] -    Follow-up Note  Referring Provider: Scifres, Nicole Cella, PA-C Primary Provider: Karlyne Greenspan Date of Office Visit: 05/09/2020  Subjective:   Lori Miles (DOB: 06/27/1995) is a 25 y.o. female who [facility] in re-evaluation of the following:  HPI: Lori Miles returns to this clinic in evaluation of asthma and a history of reflux and a history of recurrent infections.  Her last visit to this clinic was 28 March 2020 at which point in time she had COVID.  She resolved her Covid infection after about 7 days and did well.  Her mom is here today as Nesta is predominantly nonverbal.  Her mom describes as very long and complicated story about urinary tract infections that recently have required 3 different antibiotics which is not working.  Currently these antibiotics are "messing" with her seizures and "messing" with her asthma.  She has had some wheezing and she has had some vomiting as well.  Her mom increased her Pulmicort to 2 ampoules twice a day and she is using albuterol.  She has not had any fever or ugly nasal discharge or chest pain or sputum production and there is actually not a lot of coughing.  She did visit with a gastroenterologist regarding her reflux disease at the beginning of February and apparently everything is going well using famotidine twice a day... although she continues to vomit.  Allergies as of 05/09/2020      Reactions   Abilify [aripiprazole] Swelling, Palpitations   Seroquel [quetiapine Fumarate] Palpitations   Amoxicillin-pot Clavulanate Hives, Other (See Comments)   Has patient had a PCN reaction causing immediate rash, facial/tongue/throat swelling, SOB or lightheadedness with hypotension: No Has patient had a PCN reaction causing severe rash involving mucus membranes or skin necrosis:    #  #  YES  #  #  Has patient had a PCN reaction that  required hospitalization: No Has patient had a PCN reaction occurring within the last 10 years: No   Bactrim [sulfamethoxazole-trimethoprim] Other (See Comments)   Abdominal pain   Doxycycline Other (See Comments)   Stomach cramps   Macrobid [nitrofurantoin Macrocrystal] Other (See Comments)   Sharp pain/ given with Tylenol 325 mg   Keppra [levetiracetam] Other (See Comments)   Aggressive behavior       Medication List      acetaminophen 325 MG tablet Commonly known as: TYLENOL Take 2 tablets (650 mg total) by mouth every 6 (six) hours as needed for mild pain or moderate pain.   Bactroban Nasal 2 % Generic drug: mupirocin nasal ointment Place 1 application into the nose 3 (three) times daily. Use  in each nostril twice daily for twenty one (21) days.   benzonatate 200 MG capsule Commonly known as: TESSALON Take 200 mg by mouth 3 (three) times daily as needed.   budesonide 1 MG/2ML nebulizer solution Commonly known as: PULMICORT USE 1 VIAL VIA NEBULIZER FOUR TIMES DAILY DURING RESPIRATORY INFECTIONS AND ASTHMA FLARES FOR 1-2 WEEKS   cefUROXime 500 MG tablet Commonly known as: CEFTIN Take by mouth.   cetirizine 10 MG tablet Commonly known as: ZYRTEC TAKE 1 TABLET(10 MG) BY MOUTH DAILY   clotrimazole-betamethasone cream Commonly known as: LOTRISONE Apply to affected area 2 times daily prn   Depakote 500 MG DR tablet Generic drug: divalproex TAKE 1 TABLET BY MOUTH IN THE MORNING AND 2 AT NIGHT   famotidine 20 MG  tablet Commonly known as: PEPCID TAKE 1 TABLET(20 MG) BY MOUTH EVERY 12 HOURS   fluconazole 150 MG tablet Commonly known as: DIFLUCAN Take 150 mg by mouth as needed (when on antibiotics).   fluticasone 50 MCG/ACT nasal spray Commonly known as: Flonase Place 1 spray into both nostrils daily.   hydrOXYzine 25 MG tablet Commonly known as: ATARAX/VISTARIL Take 25 mg by mouth 3 (three) times daily as needed (pain).   Levonorgestrel-Ethinyl Estradiol  0.15-0.03 &0.01 MG tablet Commonly known as: Seasonique Take 1 tablet by mouth daily.   melatonin 3 MG Tabs tablet Take 2 tablets (6mg ) by mouth at bedtime.   mometasone 50 MCG/ACT nasal spray Commonly known as: NASONEX Use one spray in each nostril once daily as directed.   montelukast 10 MG tablet Commonly known as: SINGULAIR TAKE 1 TABLET(10 MG) BY MOUTH AT BEDTIME   Nebulizer Misc Adult mask with tubing. Use as directed with nebulizer device.   nystatin 100000 UNITS/ML Susp Commonly known as: MYCOSTATIN swish and swallow 3 times a day for 10 days.   nystatin powder Commonly known as: MYCOSTATIN/NYSTOP Apply 1 application topically 3 (three) times daily.   ondansetron 4 MG disintegrating tablet Commonly known as: Zofran ODT Take 1 tablet (4 mg total) by mouth every 8 (eight) hours as needed for nausea. What changed: how much to take   Perforomist 20 MCG/2ML nebulizer solution Generic drug: formoterol USE 1 VIAL VIA NEBULIZER TWICE DAILY   polyethylene glycol powder 17 GM/SCOOP powder Commonly known as: GLYCOLAX/MIRALAX DISSOLVE 1 CAPFUL IN AT LEAST 8 OZ WATER/JUICE TWICE DAILY. KEEP OFFICE VISIT FOR FURTHER REFILLS   predniSONE 20 MG tablet Commonly known as: Deltasone Take 1 tablet (20 mg total) by mouth daily with breakfast.   promethazine 25 MG tablet Commonly known as: PHENERGAN Take 1 tablet (25 mg total) by mouth every 6 (six) hours as needed for nausea or vomiting.   senna 8.6 MG tablet Commonly known as: SENOKOT Take 1 tablet by mouth at bedtime.   terconazole 0.4 % vaginal cream Commonly known as: TERAZOL 7 Place 1 applicator vaginally at bedtime. What changed:   when to take this  reasons to take this   traZODone 50 MG tablet Commonly known as: DESYREL TAKE 2 TABLETS(100 MG) BY MOUTH AT BEDTIME       Past Medical History:  Diagnosis Date  . Asthma   . Autism    mom states no actual dx, but admits she has some symptoms  . Bacteriuria    . Chronic constipation   . Cognitive developmental delay   . Constipation   . COVID-19 03/2020  . Disruptive behavior disorder   . Dyspnea    if she walks much  . Family history of adverse reaction to anesthesia     "hives"-? if anesthesia did take antibiotic  . Frequency-urgency syndrome   . Gait disorder    ORGANIC PER NERUOLGIST NOTE (DR HICKLING)  . 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 GERD (gastroesophageal reflux disease)   . Gout   . H/O sinus tachycardia   . History of acute respiratory failure 02/14/2015   SECONDARY TO CAP  . History of recurrent UTIs   . Incontinent of feces   . Interstitial cystitis   . Intertrigo 11/29/2019  . Moderate intellectual disability   . OSA (obstructive sleep apnea)    no machine yet   . Partial epilepsy with impairment of consciousness (HCC)    FOLLOWED BY DR HICKLING  .  Pneumonia 2    4 times this year ( 2020)  . PONV (postoperative nausea and vomiting)   . Renal cyst 11/29/2019  . Seizure (HCC)    most recent sz " at the end of summer"-last sz years ago per mother  . Urinary incontinence     Past Surgical History:  Procedure Laterality Date  . CYSTOSCOPY N/A 06/03/2017   Procedure: CYSTOSCOPY WITH EXAM UNDER ANESTHESIA;  Surgeon: Jerilee Field, MD;  Location: [facility];  Service: Urology;  Laterality: N/A;  . CYSTOSCOPY  2020  . CYSTOSCOPY WITH HYDRODISTENSION AND BIOPSY  04/14/2012   Procedure: CYSTOSCOPY/BIOPSY/HYDRODISTENSION;  Surgeon: Lindaann Slough, MD;  Location: [facility] [place];  Service: Urology;;  instillation of marcaine and pyridium  . EUA/  PAP SMEAR/  BREAST EXAM  03-12-2016   dr Erin Fulling [facility]  . EUA/ VAGINOSCOPY/ COLPOSCOPY FO THE HYMENAL RING  10-04-2009    dr Tamela Oddi  [facility]  . IR RADIOLOGIST EVAL & MGMT  07/29/2019  . IR RADIOLOGIST EVAL & MGMT  09/30/2019  . IR SCLEROTHERAPY OF A FLUID COLLECTION  09/08/2019  . RADIOLOGY WITH ANESTHESIA N/A  09/08/2019   Procedure: IR Sclerotherapy Treatment;  Surgeon: Radiologist, Medication, MD;  Location: [facility];  Service: Radiology;  Laterality: N/A;  . TONSILLECTOMY      Review of systems negative except as noted in HPI / PMHx or noted below:  Review of Systems  Constitutional: Negative.   HENT: Negative.   Eyes: Negative.   Respiratory: Negative.   Cardiovascular: Negative.   Gastrointestinal: Negative.   Genitourinary: Negative.   Musculoskeletal: Negative.   Skin: Negative.   Neurological: Negative.   Endo/Heme/Allergies: Negative.   Psychiatric/Behavioral: Negative.      Objective:   Vitals:   05/09/20 0859  BP: 98/60  Pulse: (!) 110  Temp: 98 F (36.7 C)  SpO2: 97%   Height: 4' 9.5" (146.1 cm)  Weight: 167 lb 9.6 oz (76 kg)   Physical Exam Constitutional:      Appearance: She is not diaphoretic.  HENT:     Head: Normocephalic.     Right Ear: Tympanic membrane, ear canal and external ear normal.     Left Ear: Tympanic membrane, ear canal and external ear normal.     Nose: Nose normal. No mucosal edema or rhinorrhea.     Mouth/Throat:     Mouth: Oropharynx is clear and moist and mucous membranes are normal.     Pharynx: Uvula midline. No oropharyngeal exudate.  Eyes:     Conjunctiva/sclera: Conjunctivae normal.  Neck:     Thyroid: No thyromegaly.     Trachea: Trachea normal. No tracheal tenderness or tracheal deviation.  Cardiovascular:     Rate and Rhythm: Normal rate and regular rhythm.     Heart sounds: Normal heart sounds, S1 normal and S2 normal. No murmur heard.   Pulmonary:     Effort: No respiratory distress.     Breath sounds: Normal breath sounds. No stridor. No wheezing or rales.  Musculoskeletal:        General: No edema.  Lymphadenopathy:     Head:     Right side of head: No tonsillar adenopathy.     Left side of head: No tonsillar adenopathy.     Cervical: No cervical adenopathy.  Skin:    Findings: No erythema or rash.     Nails:  There is no clubbing.  Neurological:     Mental Status: She is alert.  Diagnostics:   The patient had an Asthma Control Test with the following results: ACT Total Score: 18.    Assessment and Plan:   1. Not well controlled moderate persistent asthma   2. Other allergic rhinitis   3. Gastroesophageal reflux disease, unspecified whether esophagitis present     1. Continue a combination of the following:   A. budesonide 1 mg nebulized twice a day  B. Perforomist nebulized twice a day  C. Nasal saline 2 times per day  D. montelukast 10 mg daily  E. cetirizine 10 mg daily  2. Continue to Treat reflux with a combination of the following:   A. Famotidine 40mg  twice a day  B. No caffeine or chocolate consumption  3. "Action plan" for asthma flare up:   A. increase budesonide to 4 times a day  4. Continue albuterol nebulization every 4-6 hours if needed  5. Return to clinic in 6 months or earlier if problem  We will have Yeva consistently use budesonide 4 times per day while she is having a slight flareup of her asthma and then when she does well she can decrease the dose to just twice a day.  I am not really going to give her any other therapy at this point in time.  I did inform her mom that she needs to contact her urologist and get a repeat urine culture to determine whether or not be antibiotics are working for her chronic UTI.  If she does well I will see her back in this clinic in 6 months or earlier if there is a problem.  Laurette SchimkeEric Taniya Dasher, MD Allergy / Immunology  [facility]

## 2020-05-10 ENCOUNTER — Encounter: Payer: Self-pay | Admitting: Allergy and Immunology

## 2020-05-17 DIAGNOSIS — Z049 Encounter for examination and observation for unspecified reason: Secondary | ICD-10-CM | POA: Diagnosis not present

## 2020-05-17 DIAGNOSIS — R569 Unspecified convulsions: Secondary | ICD-10-CM | POA: Diagnosis not present

## 2020-05-26 ENCOUNTER — Other Ambulatory Visit: Payer: Self-pay | Admitting: Infectious Disease

## 2020-05-30 DIAGNOSIS — J4520 Mild intermittent asthma, uncomplicated: Secondary | ICD-10-CM | POA: Diagnosis not present

## 2020-06-01 DIAGNOSIS — N390 Urinary tract infection, site not specified: Secondary | ICD-10-CM | POA: Diagnosis not present

## 2020-06-05 ENCOUNTER — Encounter: Payer: Self-pay | Admitting: Physical Therapy

## 2020-06-05 ENCOUNTER — Other Ambulatory Visit: Payer: Self-pay

## 2020-06-05 ENCOUNTER — Ambulatory Visit: Payer: Medicare Other | Attending: Family | Admitting: Physical Therapy

## 2020-06-05 DIAGNOSIS — R2681 Unsteadiness on feet: Secondary | ICD-10-CM | POA: Diagnosis not present

## 2020-06-05 DIAGNOSIS — R2689 Other abnormalities of gait and mobility: Secondary | ICD-10-CM

## 2020-06-05 DIAGNOSIS — M6281 Muscle weakness (generalized): Secondary | ICD-10-CM | POA: Diagnosis not present

## 2020-06-06 NOTE — Therapy (Signed)
[facility] [address] [place], [place], [address] Phone: [phone]   Fax:  [phone]  Physical Therapy Evaluation  Patient Details  Name: Lori Miles MRN: [id number] Date of Birth: 10/25/1995 Referring Provider (PT): Elveria Rising, NP   Encounter Date: 06/05/2020   PT End of Session - 06/06/20 1603    Visit Number 1    Number of Visits 7    Date for PT Re-Evaluation 07/21/20    Authorization Type [facility] Medicare/Medicaid    Authorization Time Period 06-05-20 - 08-05-20    PT Start Time 0847    PT Stop Time 0933    PT Time Calculation (min) 46 min    Activity Tolerance Patient tolerated treatment well    Behavior During Therapy [facility] for tasks assessed/performed           Past Medical History:  Diagnosis Date  . Asthma   . Autism    mom states no actual dx, but admits she has some symptoms  . Bacteriuria   . Chronic constipation   . Cognitive developmental delay   . Constipation   . COVID-19 03/2020  . Disruptive behavior disorder   . Dyspnea    if she walks much  . Family history of adverse reaction to anesthesia     "hives"-? if anesthesia did take antibiotic  . Frequency-urgency syndrome   . Gait disorder    ORGANIC PER NERUOLGIST NOTE (DR HICKLING)  . 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 GERD (gastroesophageal reflux disease)   . Gout   . H/O sinus tachycardia   . History of acute respiratory failure 02/14/2015   SECONDARY TO CAP  . History of recurrent UTIs   . Incontinent of feces   . Interstitial cystitis   . Intertrigo 11/29/2019  . Moderate intellectual disability   . OSA (obstructive sleep apnea)    no machine yet   . Partial epilepsy with impairment of consciousness (HCC)    FOLLOWED BY DR HICKLING  . Pneumonia 2    4 times this year ( 2020)  . PONV (postoperative nausea and vomiting)   . Renal cyst 11/29/2019  . Seizure (HCC)    most recent  sz " at the end of summer"-last sz years ago per mother  . Urinary incontinence     Past Surgical History:  Procedure Laterality Date  . CYSTOSCOPY N/A 06/03/2017   Procedure: CYSTOSCOPY WITH EXAM UNDER ANESTHESIA;  Surgeon: Jerilee Field, MD;  Location: [facility];  Service: Urology;  Laterality: N/A;  . CYSTOSCOPY  2020  . CYSTOSCOPY WITH HYDRODISTENSION AND BIOPSY  04/14/2012   Procedure: CYSTOSCOPY/BIOPSY/HYDRODISTENSION;  Surgeon: Lindaann Slough, MD;  Location[facility];  Service: Urology;;  instillation of marcaine and pyridium  . EUA/  PAP SMEAR/  BREAST EXAM  03-12-2016   dr Erin Fulling [facility]  . EUA/ VAGINOSCOPY/ COLPOSCOPY FO THE HYMENAL RING  10-04-2009    dr Tamela Oddi  [facility]  . IR RADIOLOGIST EVAL & MGMT  07/29/2019  . IR RADIOLOGIST EVAL & MGMT  09/30/2019  . IR SCLEROTHERAPY OF A FLUID COLLECTION  09/08/2019  . RADIOLOGY WITH ANESTHESIA N/A 09/08/2019   Procedure: IR Sclerotherapy Treatment;  Surgeon: Radiologist, Medication, MD;  [facility];  Service: Radiology;  Laterality: N/A;  . TONSILLECTOMY      There were no vitals filed for this visit.    Subjective Assessment - 06/05/20 0855    Subjective Pt presents for  PT eval - accompanied by her mother; reports no falls since discharge from PT in July 2021; mother reports pt has tremors arms, hands (whole body) which almost causes her to fall at times; reports pt continues to use rollator for assistance with [place] ambulation distances    Patient is accompained by: Family member   mother   Pertinent History autism, cognitive developmental delay, partial epilepsy, renal cyst    Patient Stated Goals improve balance and LE strength and walking    Currently in Pain? No/denies              [facility] PT Assessment - 06/06/20 0001      Assessment   Medical Diagnosis Gait abnormality; Weakness    Referring Provider (PT) Elveria Risingina Goodpasture, NP    Prior Therapy March - July 2021       Precautions   Precautions Other (comment)   tremors     Balance Screen   Has the patient fallen in the past 6 months No    Has the patient had a decrease in activity level because of a fear of falling?  No    Is the patient reluctant to leave their home because of a fear of falling?  No      Prior Function   Level of Independence Needs assistance with ADLs;Other (comment)   supervision needed due to decr. cognition     ROM / Strength   AROM / PROM / Strength Strength      Strength   Overall Strength Due to impaired cognition;Unable to assess    Overall Strength Comments Pt did not appear to give max effort with MMT bil. LE's;  Bil. quads are at least 3+/5 with MMT but appear to be at least 4/5 with functional tasks:  Rt hip flexors 3+/5:  Lt hip flexors 3/5; Bil. hamstrings 3+/5; bil. dorsiflexors 3+/5 with MMT but at least 4/5 with functional tasks      Transfers   Transfers Sit to Stand    Five time sit to stand comments  19.72 secs - no UE support - from mat      Ambulation/Gait   Ambulation/Gait Yes    Ambulation Distance (Feet) 230 Feet    Assistive device None    Gait Pattern Step-through pattern   LLE is internally rotated   Ambulation Surface Level;Indoor    Gait velocity 15.19 secs = 2.16 ft/sec with no device    Stairs Yes    Stairs Assistance 5: Supervision    Stair Management Technique Two rails;Forwards;Alternating pattern;Step to pattern   alternating pattern ascending; step to pattern descending   Number of Stairs 4    Height of Stairs 6    Gait Comments 3" walk test -- pt amb. 2" 30 secs nonstop - 3 laps = 345'      Standardized Balance Assessment   Standardized Balance Assessment Timed Up and Go Test      Timed Up and Go Test   Normal TUG (seconds) 22.44                      Objective measurements completed on examination: See above findings.                    PT Long Term Goals - 06/06/20 1617      PT LONG TERM GOAL #1    Title Pt. will amb. 500' on flat, even surface without LOB.  Time 6    Period Weeks    Status New    Target Date 07/21/20      PT LONG TERM GOAL #2   Title Pt will perform at least 10" on recumbent bike nonstop for improved endurance/activity tolerance.    Time 6    Period Weeks    Status New    Target Date 07/21/20      PT LONG TERM GOAL #3   Title Caregiver will correctly assist with HEP, including walking program,  to assist with LE strengthening and balance.    Time 6    Period Weeks    Status New    Target Date 07/21/20      PT LONG TERM GOAL #4   Title Improve 5x sit to stand score from 19.72 secs to </= 15 secs to demo improved LE strength.    Baseline 19.72 secs    Time 6    Period Weeks    Status New    Target Date 07/21/20      PT LONG TERM GOAL #5   Title Pt will improve TUG score from 22.44 secs to </= 16 secs without device to demo improved functional mobility.    Baseline 22.44 secs without device    Time 6    Period Weeks    Status New    Target Date 07/21/20                  Plan - 06/06/20 1605    Clinical Impression Statement Pt is a 25 yr old female with developmental delay, autism, gait disorder, and mild weakness with c/o pain with MMT bil. LE's.  Pt's LLE appears to be more impaired than RLE; pt exhibits gait deviations including toeing in/ internal rotation of LLE in stance phase of gait with decreased step length.  Pt will benefit from PT to address gait and balance deficits and LE weakness.    Personal Factors and Comorbidities Behavior Pattern;Comorbidity 1;Past/Current Experience;Fitness;Time since onset of injury/illness/exacerbation    Comorbidities autism, developmental delay, renal cyst    Examination-Activity Limitations Locomotion Level;Transfers;Carry;Stand;Stairs;Squat    Examination-Participation Restrictions Meal Prep;Cleaning;Laundry;Interpersonal Relationship;Shop    Stability/Clinical Decision Making Stable/Uncomplicated     Clinical Decision Making Moderate    Rehab Potential Fair    PT Frequency 1x / week    PT Duration 6 weeks   eval + 6 visits   PT Treatment/Interventions ADLs/Self Care Home Management;Gait training;Stair training;Therapeutic activities;Therapeutic exercise;Balance training;Neuromuscular re-education    PT Next Visit Plan LE strengthening and balance training    Consulted and Agree with Plan of Care Patient;Family member/caregiver    Family Member Consulted mother           Patient will benefit from skilled therapeutic intervention in order to improve the following deficits and impairments:  Abnormal gait,Decreased balance,Decreased strength,Pain  Visit Diagnosis: Other abnormalities of gait and mobility - Plan: PT plan of care cert/re-cert  Muscle weakness (generalized) - Plan: PT plan of care cert/re-cert  Unsteadiness on feet - Plan: PT plan of care cert/re-cert     Problem List Patient Active Problem List   Diagnosis Date Noted  . Renal cyst 11/29/2019  . Intertrigo 11/29/2019  . Complex care coordination 05/18/2019  . Screening for cervical cancer 02/26/2019  . Glucosuria 06/01/2018  . 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 Not well controlled moderate persistent asthma 04/29/2018  . Gastroesophageal reflux disease 04/29/2018  . Recurrent infections 04/29/2018  . Dysuria 02/20/2018  . Chronic  rhinitis 04/03/2017  . Recurrent falls 03/21/2017  . Weakness 03/21/2017  . Venous insufficiency 03/21/2017  . OSA on CPAP 09/30/2016  . Excessive daytime sleepiness 03/05/2016  . Bronchitis 05/05/2015  . 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 Cough variant asthma vs UACS/ vcd  03/15/2015  . Pneumonia 02/14/2015  . Autism spectrum disorder 02/14/2015  . Nausea with vomiting 02/14/2015  . Obesity, morbid (HCC) 12/13/2014  . 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 Acanthosis nigricans 11/14/2014  . Toeing-in 08/29/2014  . 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 Recurrent urinary tract infection 08/01/2011  . 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 Chronic constipation 08/01/2011  . Contraception 08/01/2011    Kary Kos, PT 06/06/2020, 4:24 PM  [facility] [address] [place], [place], [address] Phone: [phone]   Fax:  [phone]  Name: PIERINA SCHUKNECHT MRN: [id number] Date of Birth: 15-May-1995

## 2020-06-09 ENCOUNTER — Telehealth (INDEPENDENT_AMBULATORY_CARE_PROVIDER_SITE_OTHER): Payer: Self-pay | Admitting: Family

## 2020-06-09 DIAGNOSIS — G40209 Localization-related (focal) (partial) symptomatic epilepsy and epileptic syndromes with complex partial seizures, not intractable, without status epilepticus: Secondary | ICD-10-CM

## 2020-06-09 DIAGNOSIS — G40309 Generalized idiopathic epilepsy and epileptic syndromes, not intractable, without status epilepticus: Secondary | ICD-10-CM

## 2020-06-09 MED ORDER — DEPAKOTE 500 MG PO TBEC
DELAYED_RELEASE_TABLET | ORAL | 0 refills | Status: DC
Start: 2020-06-09 — End: 2020-07-04

## 2020-06-09 NOTE — Telephone Encounter (Signed)
Who's calling (name and relationship to patient) : Lori Miles mom   Best contact number: [phone]  Provider they see: Elveria Rising  Reason for call: Mom was told medication would be sent to the pharmacy. Mom is at the pharmacy and they don't have medication. depakote Mom doesn't want a call back she just wants medication sent to the pharmacy.  Call ID:      PRESCRIPTION REFILL ONLY  Name of prescription: depakote   Pharmacy: [facility] on [address]

## 2020-06-11 ENCOUNTER — Other Ambulatory Visit (INDEPENDENT_AMBULATORY_CARE_PROVIDER_SITE_OTHER): Payer: Self-pay | Admitting: Family

## 2020-06-11 DIAGNOSIS — G40309 Generalized idiopathic epilepsy and epileptic syndromes, not intractable, without status epilepticus: Secondary | ICD-10-CM

## 2020-06-11 DIAGNOSIS — G40209 Localization-related (focal) (partial) symptomatic epilepsy and epileptic syndromes with complex partial seizures, not intractable, without status epilepticus: Secondary | ICD-10-CM

## 2020-06-12 ENCOUNTER — Other Ambulatory Visit: Payer: Self-pay

## 2020-06-12 ENCOUNTER — Ambulatory Visit: Payer: Medicare Other | Admitting: Physical Therapy

## 2020-06-12 DIAGNOSIS — M6281 Muscle weakness (generalized): Secondary | ICD-10-CM | POA: Diagnosis not present

## 2020-06-12 DIAGNOSIS — R2681 Unsteadiness on feet: Secondary | ICD-10-CM

## 2020-06-12 DIAGNOSIS — R2689 Other abnormalities of gait and mobility: Secondary | ICD-10-CM | POA: Diagnosis not present

## 2020-06-13 ENCOUNTER — Encounter: Payer: Self-pay | Admitting: Physical Therapy

## 2020-06-13 NOTE — Therapy (Signed)
[facility] [address] [place], [place], [address] Phone: [phone]   Fax:  [phone]  Physical Therapy Treatment  Patient Details  Name: Lori Miles MRN: [id number] Date of Birth: Oct 18, 1995 Referring Provider (PT): Elveria Rising, NP   Encounter Date: 06/12/2020   PT End of Session - 06/13/20 2212    Visit Number 2    Number of Visits 7    Date for PT Re-Evaluation 07/21/20    Authorization Type [facility] Medicare/Medicaid    Authorization Time Period 06-05-20 - 08-05-20    PT Start Time 0850    PT Stop Time 0932    PT Time Calculation (min) 42 min    Activity Tolerance Patient tolerated treatment well    Behavior During Therapy [facility] for tasks assessed/performed           Past Medical History:  Diagnosis Date  . Asthma   . Autism    mom states no actual dx, but admits she has some symptoms  . Bacteriuria   . Chronic constipation   . Cognitive developmental delay   . Constipation   . COVID-19 03/2020  . Disruptive behavior disorder   . Dyspnea    if she walks much  . Family history of adverse reaction to anesthesia     "hives"-? if anesthesia did take antibiotic  . Frequency-urgency syndrome   . Gait disorder    ORGANIC PER NERUOLGIST NOTE (DR HICKLING)  . 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 GERD (gastroesophageal reflux disease)   . Gout   . H/O sinus tachycardia   . History of acute respiratory failure 02/14/2015   SECONDARY TO CAP  . History of recurrent UTIs   . Incontinent of feces   . Interstitial cystitis   . Intertrigo 11/29/2019  . Moderate intellectual disability   . OSA (obstructive sleep apnea)    no machine yet   . Partial epilepsy with impairment of consciousness (HCC)    FOLLOWED BY DR HICKLING  . Pneumonia 2    4 times this year ( 2020)  . PONV (postoperative nausea and vomiting)   . Renal cyst 11/29/2019  . Seizure (HCC)    most recent sz  " at the end of summer"-last sz years ago per mother  . Urinary incontinence     Past Surgical History:  Procedure Laterality Date  . CYSTOSCOPY N/A 06/03/2017   Procedure: CYSTOSCOPY WITH EXAM UNDER ANESTHESIA;  Surgeon: Jerilee Field, MD;  Location: [facility];  Service: Urology;  Laterality: N/A;  . CYSTOSCOPY  2020  . CYSTOSCOPY WITH HYDRODISTENSION AND BIOPSY  04/14/2012   Procedure: CYSTOSCOPY/BIOPSY/HYDRODISTENSION;  Surgeon: Lindaann Slough, MD;  Location: [facility];  Service: Urology;;  instillation of marcaine and pyridium  . EUA/  PAP SMEAR/  BREAST EXAM  03-12-2016   dr Erin Fulling [facility]  . EUA/ VAGINOSCOPY/ COLPOSCOPY FO THE HYMENAL RING  10-04-2009    dr Tamela Oddi  [facility]  . IR RADIOLOGIST EVAL & MGMT  07/29/2019  . IR RADIOLOGIST EVAL & MGMT  09/30/2019  . IR SCLEROTHERAPY OF A FLUID COLLECTION  09/08/2019  . RADIOLOGY WITH ANESTHESIA N/A 09/08/2019   Procedure: IR Sclerotherapy Treatment;  Surgeon: Radiologist, Medication, MD;  [facility]: [facility];  Service: Radiology;  Laterality: N/A;  . TONSILLECTOMY      There were no vitals filed for this visit.             [facility] Adult PT  Treatment/Exercise - 06-12-20     Transfers   Transfers Sit to Stand    Number of Reps Other reps (comment)   5 reps   Comments 5 reps feet on Airex - no UE support used      Ambulation/Gait   Ambulation/Gait Yes    Ambulation/Gait Assistance 5: Supervision    Ambulation Distance (Feet) 350';  230'   Assistive device None    Gait Pattern Within Functional Limits    Ambulation Surface Level;Indoor    Stairs Yes    Stairs Assistance 5: Supervision    Stair Management Technique Two rails;Forwards;Alternating pattern;Step to pattern   alternating pattern ascending; step to descending    Number of Stairs 4    Height of Stairs 6      Knee/Hip Exercises: Aerobic   Recumbent Bike SciFit level 2.5 x 6" with 3 short rest breaks - w/UE's & LE's      Knee/Hip  Exercises: Machines for Strengthening   Cybex Leg Press 50# bil. LE's 3 sets 10 reps           NeuroRe-ed;  Ladder on floor x 4 reps to facilitate increased step length with min hand held assist  Cone taps (4) with min assist - 2 reps with each foot                           PT Long Term Goals - 06/13/20 2214      PT LONG TERM GOAL #1   Title Pt. will amb. 500' on flat, even surface without LOB.      Time 6    Period Weeks    Status New      PT LONG TERM GOAL #2   Title Pt will perform at least 10" on recumbent bike nonstop for improved endurance/activity tolerance.    Time 6    Period Weeks    Status New      PT LONG TERM GOAL #3   Title Caregiver will correctly assist with HEP, including walking program,  to assist with LE strengthening and balance.    Time 6    Period Weeks    Status New      PT LONG TERM GOAL #4   Title Improve 5x sit to stand score from 19.72 secs to </= 15 secs to demo improved LE strength.    Baseline 19.72 secs    Time 6    Period Weeks    Status New      PT LONG TERM GOAL #5   Title Pt will improve TUG score from 22.44 secs to </= 16 secs without device to demo improved functional mobility.    Baseline 22.44 secs without device    Time 6    Period Weeks    Status New                 Plan - 06/13/20 2212    Clinical Impression Statement Pt required short frequent seated rest periods during session due to c/o fatigue.  Pt amb. 3 laps around track (350') nonstop for furthest distance in today's session for endurance training.  LLE noted to be internally rotated in stance .  Cont with POC.    Personal Factors and Comorbidities Behavior Pattern;Comorbidity 1;Past/Current Experience;Fitness;Time since onset of injury/illness/exacerbation    Comorbidities autism, developmental delay, renal cyst    Examination-Activity Limitations Locomotion Level;Transfers;Carry;Stand;Stairs;Squat    Examination-Participation  Restrictions Meal Prep;Cleaning;Laundry;Interpersonal Relationship;Shop  Stability/Clinical Decision Making Stable/Uncomplicated    Rehab Potential Fair    PT Frequency 1x / week    PT Duration 6 weeks   eval + 6 visits   PT Treatment/Interventions ADLs/Self Care Home Management;Gait training;Stair training;Therapeutic activities;Therapeutic exercise;Balance training;Neuromuscular re-education    PT Next Visit Plan LE strengthening and balance training    Consulted and Agree with Plan of Care Patient;Family member/caregiver    Family Member Consulted mother           Patient will benefit from skilled therapeutic intervention in order to improve the following deficits and impairments:  Abnormal gait,Decreased balance,Decreased strength,Pain  Visit Diagnosis: Other abnormalities of gait and mobility  Muscle weakness (generalized)  Unsteadiness on feet     Problem List Patient Active Problem List   Diagnosis Date Noted  . Renal cyst 11/29/2019  . Intertrigo 11/29/2019  . Complex care coordination 05/18/2019  . Screening for cervical cancer 02/26/2019  . Glucosuria 06/01/2018  . 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 Not well controlled moderate persistent asthma 04/29/2018  . Gastroesophageal reflux disease 04/29/2018  . Recurrent infections 04/29/2018  . Dysuria 02/20/2018  . Chronic rhinitis 04/03/2017  . Recurrent falls 03/21/2017  . Weakness 03/21/2017  . Venous insufficiency 03/21/2017  . OSA on CPAP 09/30/2016  . Excessive daytime sleepiness 03/05/2016  . Bronchitis 05/05/2015  . 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 Cough variant asthma vs UACS/ vcd  03/15/2015  . Pneumonia 02/14/2015  . Autism spectrum disorder 02/14/2015  . Nausea with vomiting 02/14/2015  . Obesity, morbid (HCC) 12/13/2014  . 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 Acanthosis  nigricans 11/14/2014  . Toeing-in 08/29/2014  . 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 Recurrent urinary tract infection 08/01/2011  . 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 Chronic constipation 08/01/2011  . Contraception 08/01/2011    Kary Kos, PT 06/13/2020, 10:16 PM  [facility] [address] [place], [place], [address] Phone: [phone]   Fax:  [phone]  Name: Lori Miles MRN: [id number] Date of Birth: 18-Feb-1996

## 2020-06-16 ENCOUNTER — Telehealth: Payer: Self-pay

## 2020-06-16 NOTE — Telephone Encounter (Signed)
Returned call to mother, mother answered and stated that she was on the phone with someone and will have to call back.

## 2020-06-19 ENCOUNTER — Encounter: Payer: Self-pay | Admitting: Physical Therapy

## 2020-06-19 ENCOUNTER — Other Ambulatory Visit: Payer: Self-pay

## 2020-06-19 ENCOUNTER — Ambulatory Visit: Payer: Medicare Other | Attending: Family | Admitting: Physical Therapy

## 2020-06-19 DIAGNOSIS — R2689 Other abnormalities of gait and mobility: Secondary | ICD-10-CM | POA: Insufficient documentation

## 2020-06-19 DIAGNOSIS — R2681 Unsteadiness on feet: Secondary | ICD-10-CM | POA: Insufficient documentation

## 2020-06-19 DIAGNOSIS — Z049 Encounter for examination and observation for unspecified reason: Secondary | ICD-10-CM | POA: Diagnosis not present

## 2020-06-19 DIAGNOSIS — M6281 Muscle weakness (generalized): Secondary | ICD-10-CM | POA: Diagnosis not present

## 2020-06-19 DIAGNOSIS — R569 Unspecified convulsions: Secondary | ICD-10-CM | POA: Diagnosis not present

## 2020-06-20 NOTE — Therapy (Signed)
[facility] [address] [place], [place], [address] Phone: [phone]   Fax:  [phone]  Physical Therapy Treatment  Patient Details  Name: Lori Miles MRN: [id number] Date of Birth: Oct 17, 1995 Referring Provider (PT): Elveria Rising, NP   Encounter Date: 06/19/2020   PT End of Session - 06/20/20 1855    Visit Number 3    Number of Visits 7    Date for PT Re-Evaluation 07/21/20    Authorization Type [facility] Medicare/Medicaid    Authorization Time Period 06-05-20 - 08-05-20    PT Start Time 0801    PT Stop Time 0845    PT Time Calculation (min) 44 min    Activity Tolerance Patient tolerated treatment well    Behavior During Therapy [facility] for tasks assessed/performed           Past Medical History:  Diagnosis Date  . Asthma   . Autism    mom states no actual dx, but admits she has some symptoms  . Bacteriuria   . Chronic constipation   . Cognitive developmental delay   . Constipation   . COVID-19 03/2020  . Disruptive behavior disorder   . Dyspnea    if she walks much  . Family history of adverse reaction to anesthesia     "hives"-? if anesthesia did take antibiotic  . Frequency-urgency syndrome   . Gait disorder    ORGANIC PER NERUOLGIST NOTE (DR HICKLING)  . 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 GERD (gastroesophageal reflux disease)   . Gout   . H/O sinus tachycardia   . History of acute respiratory failure 02/14/2015   SECONDARY TO CAP  . History of recurrent UTIs   . Incontinent of feces   . Interstitial cystitis   . Intertrigo 11/29/2019  . Moderate intellectual disability   . OSA (obstructive sleep apnea)    no machine yet   . Partial epilepsy with impairment of consciousness (HCC)    FOLLOWED BY DR HICKLING  . Pneumonia 2    4 times this year ( 2020)  . PONV (postoperative nausea and vomiting)   . Renal cyst 11/29/2019  . Seizure (HCC)    most recent sz  " at the end of summer"-last sz years ago per mother  . Urinary incontinence     Past Surgical History:  Procedure Laterality Date  . CYSTOSCOPY N/A 06/03/2017   Procedure: CYSTOSCOPY WITH EXAM UNDER ANESTHESIA;  Surgeon: Jerilee Field, MD;  Location: [name];  Service: Urology;  Laterality: N/A;  . CYSTOSCOPY  2020  . CYSTOSCOPY WITH HYDRODISTENSION AND BIOPSY  04/14/2012   Procedure: CYSTOSCOPY/BIOPSY/HYDRODISTENSION;  Surgeon: Lindaann Slough, MD;  Location: [facility];  Service: Urology;;  instillation of marcaine and pyridium  . EUA/  PAP SMEAR/  BREAST EXAM  03-12-2016   dr Erin Fulling [facility]  . EUA/ VAGINOSCOPY/ COLPOSCOPY FO THE HYMENAL RING  10-04-2009    dr Tamela Oddi  [facility]  . IR RADIOLOGIST EVAL & MGMT  07/29/2019  . IR RADIOLOGIST EVAL & MGMT  09/30/2019  . IR SCLEROTHERAPY OF A FLUID COLLECTION  09/08/2019  . RADIOLOGY WITH ANESTHESIA N/A 09/08/2019   Procedure: IR Sclerotherapy Treatment;  Surgeon: Radiologist, Medication, MD;  Location: [facility];  Service: Radiology;  Laterality: N/A;  . TONSILLECTOMY      There were no vitals filed for this visit.  [facility] Adult PT Treatment/Exercise - 06/20/20 0001      Transfers   Transfers Sit to Stand    Number of Reps Other reps (comment)   5   Comments feet on blue Airex      Ambulation/Gait   Ambulation/Gait Yes    Ambulation Distance (Feet) 230 Feet   230 x 4 reps = 960' total distance   Assistive device None    Gait Pattern Step-through pattern   LLE is internally rotated   Ambulation Surface Level;Indoor      Knee/Hip Exercises: Aerobic   Recumbent Bike SciFit level 2.5 x 5" with UE's and LE's      Knee/Hip Exercises: Machines for Strengthening   Total Gym Leg Press 50# bil. LE's 2 sets 10 reps      Knee/Hip Exercises: Standing   Hip Abduction Stengthening;Both;1 set;10 reps;Knee straight   3# weight on each leg   Hip Extension  Stengthening;Right;Left;1 set;10 reps;Knee straight   3# weight   Forward Step Up Both;1 set;5 reps   5 reps each leg   Other Standing Knee Exercises hip flexion 10 reps each leg with 3# weight               Balance Exercises - 06/20/20 0001      Balance Exercises: Standing   Rockerboard Anterior/posterior;EO;Intermittent UE support;Other reps (comment)   20 reps   Marching Solid surface;Static;10 reps    Other Standing Exercises ladder on floor to increase step length 4 reps with min hand held assist    Other Standing Exercises Comments Cone taps to 4 cones - pt standing on floor with CGA; pt stood on Bosu inside // bars with bil. UE support on // bars - weight shifts laterally 10 reps;  squats x 10 reps with UE support                  PT Long Term Goals - 06/20/20 1904      PT LONG TERM GOAL #1   Title Pt. will amb. 500' on flat, even surface without LOB.      Time 6    Period Weeks    Status New      PT LONG TERM GOAL #2   Title Pt will perform at least 10" on recumbent bike nonstop for improved endurance/activity tolerance.    Time 6    Period Weeks    Status New      PT LONG TERM GOAL #3   Title Caregiver will correctly assist with HEP, including walking program,  to assist with LE strengthening and balance.    Time 6    Period Weeks    Status New      PT LONG TERM GOAL #4   Title Improve 5x sit to stand score from 19.72 secs to </= 15 secs to demo improved LE strength.    Baseline 19.72 secs    Time 6    Period Weeks    Status New      PT LONG TERM GOAL #5   Title Pt will improve TUG score from 22.44 secs to </= 16 secs without device to demo improved functional mobility.    Baseline 22.44 secs without device    Time 6    Period Weeks    Status New                 Plan - 06/20/20 1855    Clinical Impression Statement Pt performed exercises well with few  seated rest breaks needed.  Pt very compliant with exercises in today's session.  LLE  continues to be internally rotated but pt able to externally rotate to neutral position with verbal cues. Pt demonstrated improved endurance in today's session.  Cont with POC.    Personal Factors and Comorbidities Behavior Pattern;Comorbidity 1;Past/Current Experience;Fitness;Time since onset of injury/illness/exacerbation    Comorbidities autism, developmental delay, renal cyst    Examination-Activity Limitations Locomotion Level;Transfers;Carry;Stand;Stairs;Squat    Examination-Participation Restrictions Meal Prep;Cleaning;Laundry;Interpersonal Relationship;Shop    Stability/Clinical Decision Making Stable/Uncomplicated    Rehab Potential Fair    PT Frequency 1x / week    PT Duration 6 weeks   eval + 6 visits   PT Treatment/Interventions ADLs/Self Care Home Management;Gait training;Stair training;Therapeutic activities;Therapeutic exercise;Balance training;Neuromuscular re-education    PT Next Visit Plan LE strengthening and balance training    Consulted and Agree with Plan of Care Patient;Family member/caregiver    Family Member Consulted mother           Patient will benefit from skilled therapeutic intervention in order to improve the following deficits and impairments:  Abnormal gait,Decreased balance,Decreased strength,Pain  Visit Diagnosis: Other abnormalities of gait and mobility  Muscle weakness (generalized)  Unsteadiness on feet     Problem List Patient Active Problem List   Diagnosis Date Noted  . Renal cyst 11/29/2019  . Intertrigo 11/29/2019  . Complex care coordination 05/18/2019  . Screening for cervical cancer 02/26/2019  . Glucosuria 06/01/2018  . 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 Not well controlled moderate persistent asthma 04/29/2018  . Gastroesophageal reflux disease 04/29/2018  . Recurrent infections 04/29/2018  . Dysuria 02/20/2018  . Chronic rhinitis 04/03/2017  . Recurrent falls 03/21/2017  . Weakness 03/21/2017  . Venous  insufficiency 03/21/2017  . OSA on CPAP 09/30/2016  . Excessive daytime sleepiness 03/05/2016  . Bronchitis 05/05/2015  . 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 Cough variant asthma vs UACS/ vcd  03/15/2015  . Pneumonia 02/14/2015  . Autism spectrum disorder 02/14/2015  . Nausea with vomiting 02/14/2015  . Obesity, morbid (HCC) 12/13/2014  . 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 Acanthosis nigricans 11/14/2014  . Toeing-in 08/29/2014  . 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 Recurrent urinary tract infection 08/01/2011  . 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 Chronic constipation 08/01/2011  . Contraception 08/01/2011    Kary Kos, PT 06/20/2020, 7:10 PM  [facility] [address] [place], [place], [address] Phone: [phone]   Fax:  [phone]  Name: Lori Miles MRN: [id number] Date of Birth: 08-22-1995

## 2020-06-27 DIAGNOSIS — J4520 Mild intermittent asthma, uncomplicated: Secondary | ICD-10-CM | POA: Diagnosis not present

## 2020-06-28 ENCOUNTER — Ambulatory Visit (INDEPENDENT_AMBULATORY_CARE_PROVIDER_SITE_OTHER): Payer: Medicare Other | Admitting: Family

## 2020-07-03 ENCOUNTER — Other Ambulatory Visit: Payer: Self-pay

## 2020-07-03 ENCOUNTER — Encounter: Payer: Self-pay | Admitting: Physical Therapy

## 2020-07-03 ENCOUNTER — Ambulatory Visit: Payer: Medicare Other | Admitting: Physical Therapy

## 2020-07-03 DIAGNOSIS — R2689 Other abnormalities of gait and mobility: Secondary | ICD-10-CM

## 2020-07-03 DIAGNOSIS — M6281 Muscle weakness (generalized): Secondary | ICD-10-CM | POA: Diagnosis not present

## 2020-07-03 DIAGNOSIS — R2681 Unsteadiness on feet: Secondary | ICD-10-CM | POA: Diagnosis not present

## 2020-07-03 NOTE — Therapy (Signed)
[facility] [address] [place], [place], [address] Phone: [phone]   Fax:  [phone]  Physical Therapy Treatment  Patient Details  Name: Lori Miles MRN: [id number] Date of Birth: 03/15/1996 Referring Provider (PT): Elveria Rising, NP   Encounter Date: 07/03/2020   PT End of Session - 07/03/20 0922    Visit Number 4    Number of Visits 7    Date for PT Re-Evaluation 07/21/20    Authorization Type [facility] Medicare/Medicaid    Authorization Time Period 06-05-20 - 08-05-20    PT Start Time 0815   pt arrived early for 8:45 appt   PT Stop Time 0900    PT Time Calculation (min) 45 min    Activity Tolerance Patient tolerated treatment well    Behavior During Therapy [facility] for tasks assessed/performed           Past Medical History:  Diagnosis Date  . Asthma   . Autism    mom states no actual dx, but admits she has some symptoms  . Bacteriuria   . Chronic constipation   . Cognitive developmental delay   . Constipation   . COVID-19 03/2020  . Disruptive behavior disorder   . Dyspnea    if she walks much  . Family history of adverse reaction to anesthesia     "hives"-? if anesthesia did take antibiotic  . Frequency-urgency syndrome   . Gait disorder    ORGANIC PER NERUOLGIST NOTE (DR HICKLING)  . 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 GERD (gastroesophageal reflux disease)   . Gout   . H/O sinus tachycardia   . History of acute respiratory failure 02/14/2015   SECONDARY TO CAP  . History of recurrent UTIs   . Incontinent of feces   . Interstitial cystitis   . Intertrigo 11/29/2019  . Moderate intellectual disability   . OSA (obstructive sleep apnea)    no machine yet   . Partial epilepsy with impairment of consciousness (HCC)    FOLLOWED BY DR HICKLING  . Pneumonia 2    4 times this year ( 2020)  . PONV (postoperative nausea and vomiting)   . Renal cyst 11/29/2019  .  Seizure (HCC)    most recent sz " at the end of summer"-last sz years ago per mother  . Urinary incontinence     Past Surgical History:  Procedure Laterality Date  . CYSTOSCOPY N/A 06/03/2017   Procedure: CYSTOSCOPY WITH EXAM UNDER ANESTHESIA;  Surgeon: Jerilee Field, MD;  Location: [facility];  Service: Urology;  Laterality: N/A;  . CYSTOSCOPY  2020  . CYSTOSCOPY WITH HYDRODISTENSION AND BIOPSY  04/14/2012   Procedure: CYSTOSCOPY/BIOPSY/HYDRODISTENSION;  Surgeon: Lindaann Slough, MD;  Location: [facility];  Service: Urology;;  instillation of marcaine and pyridium  . EUA/  PAP SMEAR/  BREAST EXAM  03-12-2016   dr Erin Fulling [facility]  . EUA/ VAGINOSCOPY/ COLPOSCOPY FO THE HYMENAL RING  10-04-2009    dr Tamela Oddi  [facility]  . IR RADIOLOGIST EVAL & MGMT  07/29/2019  . IR RADIOLOGIST EVAL & MGMT  09/30/2019  . IR SCLEROTHERAPY OF A FLUID COLLECTION  09/08/2019  . RADIOLOGY WITH ANESTHESIA N/A 09/08/2019   Procedure: IR Sclerotherapy Treatment;  Surgeon: Radiologist, Medication, MD;  Location: [facility];  Service: Radiology;  Laterality: N/A;  . TONSILLECTOMY      There were no vitals filed for this visit.   Subjective Assessment - 07/03/20 0818  Subjective Pt's reports both legs are hurting - mother says the pain started yesterday; has been using walker when walking prolonged distances    Patient is accompained by: Family member   mother   Pertinent History autism, cognitive developmental delay, partial epilepsy, renal cyst    Patient Stated Goals improve balance and LE strength and walking    Currently in Pain? Yes    Pain Score 6     Pain Location Leg    Pain Orientation Right;Left    Pain Descriptors / Indicators Aching    Pain Type Acute pain    Pain Onset Yesterday    Pain Frequency Intermittent    Aggravating Factors  unknown    Pain Relieving Factors non-weightbearing helps                             [facility] Adult PT  Treatment/Exercise - 07/03/20 0917      Ambulation/Gait   Ambulation/Gait Yes    Ambulation Distance (Feet) 230 Feet   230', 350'   Assistive device None    Gait Pattern Step-through pattern   LLE is internally rotated   Ambulation Surface Level;Indoor      Knee/Hip Exercises: Stretches   Active Hamstring Stretch Right;Left;1 rep;30 seconds   Runner's stretch     Knee/Hip Exercises: Aerobic   Recumbent Bike SciFit level 3.0 x 4", level 2.0 x 1" with UE's and LE's = 5" total      Knee/Hip Exercises: Machines for Strengthening   Total Gym Leg Press 50# bil. LE's 3 sets 10 reps      Knee/Hip Exercises: Standing   Hip Flexion Stengthening;Right;Left;10 reps;2 sets;Knee bent   10 reps with knee flexed, 10 reps with knee extended   Hip Abduction Stengthening;Both;1 set;10 reps;Knee straight   3# weight on each leg   Hip Extension Stengthening;Right;Left;1 set;10 reps;Knee straight   3# weight              Balance Exercises - 07/03/20 0001      Balance Exercises: Standing   Rockerboard Anterior/posterior;EO;Intermittent UE support;Other reps (comment)   20 reps   Marching Foam/compliant surface;Static;10 reps   with CGA to min assist   Other Standing Exercises Comments Cone taps to 5 cones - pt standing on floor with CGA; progressed to tipping cone over and then standing upright with CGA          Pt performed alternate tap ups to 6" step 5 reps each leg with CGA        PT Long Term Goals - 07/03/20 0926      PT LONG TERM GOAL #1   Title Pt. will amb. 500' on flat, even surface without LOB.      Time 6    Period Weeks    Status New      PT LONG TERM GOAL #2   Title Pt will perform at least 10" on recumbent bike nonstop for improved endurance/activity tolerance.    Time 6    Period Weeks    Status New      PT LONG TERM GOAL #3   Title Caregiver will correctly assist with HEP, including walking program,  to assist with LE strengthening and balance.    Time 6     Period Weeks    Status New      PT LONG TERM GOAL #4   Title Improve 5x sit to stand score from 19.72 secs  to </= 15 secs to demo improved LE strength.    Baseline 19.72 secs    Time 6    Period Weeks    Status New      PT LONG TERM GOAL #5   Title Pt will improve TUG score from 22.44 secs to </= 16 secs without device to demo improved functional mobility.    Baseline 22.44 secs without device    Time 6    Period Weeks    Status New                 Plan - 07/03/20 0932    Clinical Impression Statement Pt's gait pattern improved at end of session compared to that demonstrated at start of session, with more stability noted in LLE.  Less antalgic gait pattern noted at end of session with no c/o pain reported.  Pt continues to take frequent short seated rest periods during session.  Pt did well with SLS activities on solid surface with only CGA given for safety.  Cont with POC.    Personal Factors and Comorbidities Behavior Pattern;Comorbidity 1;Past/Current Experience;Fitness;Time since onset of injury/illness/exacerbation    Comorbidities autism, developmental delay, renal cyst    Examination-Activity Limitations Locomotion Level;Transfers;Carry;Stand;Stairs;Squat    Examination-Participation Restrictions Meal Prep;Cleaning;Laundry;Interpersonal Relationship;Shop    Stability/Clinical Decision Making Stable/Uncomplicated    Rehab Potential Fair    PT Frequency 1x / week    PT Duration 6 weeks   eval + 6 visits   PT Treatment/Interventions ADLs/Self Care Home Management;Gait training;Stair training;Therapeutic activities;Therapeutic exercise;Balance training;Neuromuscular re-education    PT Next Visit Plan LE strengthening and balance training    Consulted and Agree with Plan of Care Patient;Family member/caregiver    Family Member Consulted mother           Patient will benefit from skilled therapeutic intervention in order to improve the following deficits and impairments:   Abnormal gait,Decreased balance,Decreased strength,Pain  Visit Diagnosis: Other abnormalities of gait and mobility  Muscle weakness (generalized)  Unsteadiness on feet     Problem List Patient Active Problem List   Diagnosis Date Noted  . Renal cyst 11/29/2019  . Intertrigo 11/29/2019  . Complex care coordination 05/18/2019  . Screening for cervical cancer 02/26/2019  . Glucosuria 06/01/2018  . 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 Not well controlled moderate persistent asthma 04/29/2018  . Gastroesophageal reflux disease 04/29/2018  . Recurrent infections 04/29/2018  . Dysuria 02/20/2018  . Chronic rhinitis 04/03/2017  . Recurrent falls 03/21/2017  . Weakness 03/21/2017  . Venous insufficiency 03/21/2017  . OSA on CPAP 09/30/2016  . Excessive daytime sleepiness 03/05/2016  . Bronchitis 05/05/2015  . 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 Cough variant asthma vs UACS/ vcd  03/15/2015  . Pneumonia 02/14/2015  . Autism spectrum disorder 02/14/2015  . Nausea with vomiting 02/14/2015  . Obesity, morbid (HCC) 12/13/2014  . 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 Acanthosis nigricans 11/14/2014  . Toeing-in 08/29/2014  . 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 Recurrent urinary tract infection 08/01/2011  . 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 Chronic constipation 08/01/2011  . Contraception 08/01/2011    Kary Kos, PT 07/03/2020, 9:27 AM  [facility] [address] [place], [place], [address] Phone: [phone]   Fax:  [phone]  Name: Lori Miles MRN: [id number] Date of Birth: 13-Jun-1995

## 2020-07-04 ENCOUNTER — Encounter (INDEPENDENT_AMBULATORY_CARE_PROVIDER_SITE_OTHER): Payer: Self-pay | Admitting: Family

## 2020-07-04 ENCOUNTER — Other Ambulatory Visit: Payer: Self-pay | Admitting: Internal Medicine

## 2020-07-04 ENCOUNTER — Ambulatory Visit (INDEPENDENT_AMBULATORY_CARE_PROVIDER_SITE_OTHER): Payer: Medicare Other | Admitting: Family

## 2020-07-04 ENCOUNTER — Other Ambulatory Visit (INDEPENDENT_AMBULATORY_CARE_PROVIDER_SITE_OTHER): Payer: Self-pay | Admitting: Family

## 2020-07-04 VITALS — BP 102/70 | HR 88 | Ht 59.5 in | Wt 162.8 lb

## 2020-07-04 DIAGNOSIS — G40209 Localization-related (focal) (partial) symptomatic epilepsy and epileptic syndromes with complex partial seizures, not intractable, without status epilepticus: Secondary | ICD-10-CM

## 2020-07-04 DIAGNOSIS — G40309 Generalized idiopathic epilepsy and epileptic syndromes, not intractable, without status epilepticus: Secondary | ICD-10-CM

## 2020-07-04 DIAGNOSIS — F819 Developmental disorder of scholastic skills, unspecified: Secondary | ICD-10-CM

## 2020-07-04 DIAGNOSIS — F840 Autistic disorder: Secondary | ICD-10-CM

## 2020-07-04 DIAGNOSIS — G478 Other sleep disorders: Secondary | ICD-10-CM | POA: Diagnosis not present

## 2020-07-04 DIAGNOSIS — G4700 Insomnia, unspecified: Secondary | ICD-10-CM | POA: Diagnosis not present

## 2020-07-04 NOTE — Telephone Encounter (Signed)
Please send to the pharmacy °

## 2020-07-04 NOTE — Progress Notes (Signed)
Lori Miles   MRN:  [id number]  02/20/1996   Provider: Elveria Rising NP-C Location of Care: [facility]  Visit type: Routine visit  Last visit: 11/30/2019  Referral source: Clementeen Graham, PA-C History from: mother and chcn chart  Brief history:  Copied from previous record: Generalized and convulsive epilepsy, intellectual disability, autism spectrum disorder, disordered sleep and gait disorder. She also has asthma and history of frequent urinary tract infections. She is taking and tolerating Depakote for seizures and Trazodone for insomnia.  Today's concerns:  Mother reports today that Lori Miles continues to experience occasional brief seizures when she has an infection. Lori Miles has frequent UTI's and respiratory infections related to asthma. Lori Miles continues to have problems with sleep and Trazodone helps with that.   Lori Miles has lost some weight since her last visit and has been walking better with the reduction in weight. She continues to receive PT services because of ongoing gait problems and tendency to fall.   Lori Miles has been otherwise generally healthy since she was last seen. Mom has no other health concerns for Lori Miles  today other than previously mentioned.  Review of systems: Please see HPI for neurologic and other pertinent review of systems. Otherwise all other systems were reviewed and were negative.  Problem List: Patient Active Problem List   Diagnosis Date Noted  . Renal cyst 11/29/2019  . Intertrigo 11/29/2019  . Complex care coordination 05/18/2019  . Screening for cervical cancer 02/26/2019  . Glucosuria 06/01/2018  . 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 Not well controlled moderate persistent asthma 04/29/2018  . Gastroesophageal reflux disease 04/29/2018  . Recurrent infections 04/29/2018  . Dysuria 02/20/2018  . Chronic rhinitis 04/03/2017  . Recurrent falls 03/21/2017  . Weakness 03/21/2017  . Venous  insufficiency 03/21/2017  . OSA on CPAP 09/30/2016  . Excessive daytime sleepiness 03/05/2016  . Bronchitis 05/05/2015  . 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 Cough variant asthma vs UACS/ vcd  03/15/2015  . Pneumonia 02/14/2015  . Autism spectrum disorder 02/14/2015  . Nausea with vomiting 02/14/2015  . Obesity, morbid (HCC) 12/13/2014  . 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 Acanthosis nigricans 11/14/2014  . Toeing-in 08/29/2014  . 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 Recurrent urinary tract infection 08/01/2011  . 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 Chronic constipation 08/01/2011  . Contraception 08/01/2011     Past Medical History:  Diagnosis Date  . Asthma   . Autism    mom states no actual dx, but admits she has some symptoms  . Bacteriuria   . Chronic constipation   . Cognitive developmental delay   . Constipation   . COVID-19 03/2020  . Disruptive behavior disorder   . Dyspnea    if she walks much  . Family history of adverse reaction to anesthesia     "hives"-? if anesthesia did take antibiotic  . Frequency-urgency syndrome   . Gait disorder    ORGANIC PER NERUOLGIST NOTE (DR HICKLING)  . 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 GERD (gastroesophageal reflux disease)   . Gout   . H/O sinus tachycardia   . History of acute respiratory failure 02/14/2015   SECONDARY TO CAP  . History of recurrent UTIs   . Incontinent of feces   .  Interstitial cystitis   . Intertrigo 11/29/2019  . Moderate intellectual disability   . OSA (obstructive sleep apnea)    no machine yet   . Partial epilepsy with impairment of consciousness (HCC)    FOLLOWED BY DR HICKLING  . Pneumonia 2    4 times this year ( 2020)   . PONV (postoperative nausea and vomiting)   . Renal cyst 11/29/2019  . Seizure (HCC)    most recent sz " at the end of summer"-last sz years ago per mother  . Urinary incontinence     Past medical history comments: See HPI Copied from previous record: EEG on September 14, 2011, was normal. She was able to be successfully weaned off Keppra. The patient has significant issues with aggression, which was thought to be related to Keppra, but has persisted once it was discontinued. The patient was treated with Depakote both for behavior and possible seizures. She had palpitations and severe constipation on Seroquel. She had marked weight gain on Abilify and Depakote. When generic Divalproex was tried she had breakthrough seizures.   Nocturnal polysomnogram on October 27, 2011, failed to show significant periodic limb movements, cyanosis, sleep apnea, or cardiac arrhythmia. EEG on February 20, 2012, was a normal study with the patient awake.   EEG performed March 25, 2017 was normal  EEG performed May 21, 2018 was normal  Surgical history: Past Surgical History:  Procedure Laterality Date  . CYSTOSCOPY N/A 06/03/2017   Procedure: CYSTOSCOPY WITH EXAM UNDER ANESTHESIA;  Surgeon: Jerilee Field, MD;  Location: [facility];  Service: Urology;  Laterality: N/A;  . CYSTOSCOPY  2020  . CYSTOSCOPY WITH HYDRODISTENSION AND BIOPSY  04/14/2012   Procedure: CYSTOSCOPY/BIOPSY/HYDRODISTENSION;  Surgeon: Lindaann Slough, MD;  Location: [facility] ;  Service: Urology;;  instillation of marcaine and pyridium  . EUA/  PAP SMEAR/  BREAST EXAM  03-12-2016   dr Erin Fulling [facility]  . EUA/ VAGINOSCOPY/ COLPOSCOPY FO THE HYMENAL RING  10-04-2009    dr Tamela Oddi  [facility]  . IR RADIOLOGIST EVAL & MGMT  07/29/2019  . IR RADIOLOGIST EVAL & MGMT  09/30/2019  . IR SCLEROTHERAPY OF A FLUID COLLECTION  09/08/2019  . RADIOLOGY WITH ANESTHESIA N/A 09/08/2019   Procedure: IR Sclerotherapy Treatment;   Surgeon: Radiologist, Medication, MD;  Location: [facility];  Service: Radiology;  Laterality: N/A;  . TONSILLECTOMY      Family history: family history includes Pancreatic cancer in her sister; Seizures in her mother and sister.   Social history: Social History   Socioeconomic History  . Marital status: Single    Spouse name: Not on file  . Number of children: Not on file  . Years of education: Not on file  . Highest education level: Not on file  Occupational History  . Not on file  Tobacco Use  . Smoking status: Never Smoker  . Smokeless tobacco: Never Used  Vaping Use  . Vaping Use: Never used  Substance and Sexual Activity  . Alcohol use: No    Alcohol/week: 0.0 standard drinks  . Drug use: No  . Sexual activity: Never    Birth control/protection: Pill  Other Topics Concern  . Not on file  Social History Narrative   Lives with mom ([name] Lueth) and half-sister Aliece Honold) who is also mentally impaired.  Has CAP assistance, urinary incontinence, needs help with feeding,dressing, toileting   Graduated from [employer].  She enjoys playing with cars, dancing, and dogs.   Social Determinants  of Health   Financial Resource Strain: Not on file  Food Insecurity: Not on file  Transportation Needs: Not on file  Physical Activity: Not on file  Stress: Not on file  Social Connections: Not on file  Intimate Partner Violence: Not on file    Past/failed meds: Copied from previous record: Levetiracetam - aggressive behavior  Allergies: Allergies  Allergen Reactions  . Abilify [Aripiprazole] Swelling and Palpitations  . Seroquel [Quetiapine Fumarate] Palpitations  . Amoxicillin-Pot Clavulanate Hives and Other (See Comments)    Has patient had a PCN reaction causing immediate rash, facial/tongue/throat swelling, SOB or lightheadedness with hypotension: No Has patient had a PCN reaction causing severe rash involving mucus membranes or skin necrosis:    #  #  YES   #  #  Has patient had a PCN reaction that required hospitalization: No Has patient had a PCN reaction occurring within the last 10 years: No   . Bactrim [Sulfamethoxazole-Trimethoprim] Other (See Comments)    Abdominal pain  . Doxycycline Other (See Comments)    Stomach cramps  . Macrobid [Nitrofurantoin Macrocrystal] Other (See Comments)    Sharp pain/ given with Tylenol 325 mg  . Keppra [Levetiracetam] Other (See Comments)    Aggressive behavior    Immunizations: Immunization History  Administered Date(s) Administered  . Pneumococcal Polysaccharide-23 08/06/2016  . Tdap 03/03/2017    Diagnostics/Screenings: Copied from previous record: 12/15/19 rEEG - This EEG is normal during awake and asleep states. Please note that normal EEG does not exclude epilepsy, clinical correlation is indicated.  Keturah Shavers, MD  03/27/17 - CT head - normal  Physical Exam: BP 102/70   Pulse 88   Ht 4' 11.5" (1.511 m)   Wt 162 lb 12.8 oz (73.8 kg)   BMI 32.33 kg/m   Wt Readings from Last 3 Encounters:  07/04/20 162 lb 12.8 oz (73.8 kg)  05/09/20 167 lb 9.6 oz (76 kg)  05/03/20 158 lb (71.7 kg)   General: well developed, well nourished young woman, seated on exam table, in no evident distress; black hair, brown eyes, right handed Head: normocephalic and atraumatic. Oropharynx benign. No dysmorphic features. Neck: supple Cardiovascular: regular rate and rhythm, no murmurs. Respiratory: clear to auscultation bilaterally Abdomen: bowel sounds present all four quadrants, abdomen soft, non-tender, non-distended.  Musculoskeletal: no skeletal deformities or obvious scoliosis. Skin: no rashes or neurocutaneous lesions  Neurologic Exam Mental Status: awake and fully alert. Limited language. Variable eye contact. Smiles responsively. Able to follow very simple commands Cranial Nerves: fundoscopic exam - red reflex present.  Unable to fully visualize fundus.  Pupils equal briskly reactive to light.   Turns to localize faces and objects in the periphery. Turns to localize sounds in the periphery. Facial movements are symmetric. Motor: normal functional bulk, tone and strength Sensory: withdrawal x 4 Coordination: unable to adequately assess due to patient's inability to participate in examination. No dysmetria when reaching for objects. Gait and Station: Able to stand with assistance. Able to take steps but has poor balance and needs support. The left toes turn in when walking. Reflexes: diminished and symmetric. Toes neutral. No clonus  Impression: Generalized convulsive epilepsy (HCC)  Insomnia, unspecified type - Plan: traZODone (DESYREL) 50 MG tablet  Partial epilepsy with impairment of consciousness (HCC)  Cognitive developmental delay  Sleep arousal disorder  Autism spectrum disorder    Recommendations for plan of care: The patient's previous [facility] records were reviewed. Clare has neither had nor required imaging or lab studies since the  last visit, other than what has been performed by other providers. She continues to have intermittent brief seizures when she has an infection. She is compliant with medication and does not miss does. She has ongoing problems with insomnia but Trazodone usually helps to get her to sleep. I will make no changes in her treatment plan today. I will see Luvern back in follow up in 6 months or sooner if needed.   The medication list was reviewed and reconciled. No changes were made in the prescribed medications today. A complete medication list was provided to the patient.  Return in about 6 months (around 01/03/2021).   Allergies as of 07/04/2020      Reactions   Abilify [aripiprazole] Swelling, Palpitations   Seroquel [quetiapine Fumarate] Palpitations   Amoxicillin-pot Clavulanate Hives, Other (See Comments)   Has patient had a PCN reaction causing immediate rash, facial/tongue/throat swelling, SOB or lightheadedness with hypotension:  No Has patient had a PCN reaction causing severe rash involving mucus membranes or skin necrosis:    #  #  YES  #  #  Has patient had a PCN reaction that required hospitalization: No Has patient had a PCN reaction occurring within the last 10 years: No   Bactrim [sulfamethoxazole-trimethoprim] Other (See Comments)   Abdominal pain   Doxycycline Other (See Comments)   Stomach cramps   Macrobid [nitrofurantoin Macrocrystal] Other (See Comments)   Sharp pain/ given with Tylenol 325 mg   Keppra [levetiracetam] Other (See Comments)   Aggressive behavior       Medication List       Accurate as of July 04, 2020 11:59 PM. If you have any questions, ask your nurse or doctor.        acetaminophen 325 MG tablet Commonly known as: TYLENOL Take 2 tablets (650 mg total) by mouth every 6 (six) hours as needed for mild pain or moderate pain.   albuterol 108 (90 Base) MCG/ACT inhaler Commonly known as: VENTOLIN HFA Inhale 2 puffs into the lungs every 6 (six) hours as needed for wheezing or shortness of breath.   albuterol (2.5 MG/3ML) 0.083% nebulizer solution Commonly known as: PROVENTIL Take 3 mLs (2.5 mg total) by nebulization every 4 (four) hours as needed for wheezing or shortness of breath.   Bactroban Nasal 2 % Generic drug: mupirocin nasal ointment Place 1 application into the nose 3 (three) times daily. Use  in each nostril twice daily for twenty one (21) days.   benzonatate 200 MG capsule Commonly known as: TESSALON Take 200 mg by mouth 3 (three) times daily as needed.   budesonide 1 MG/2ML nebulizer solution Commonly known as: PULMICORT USE 1 VIAL VIA NEBULIZER FOUR TIMES DAILY DURING RESPIRATORY INFECTIONS AND ASTHMA FLARES FOR 1-2 WEEKS   cetirizine 10 MG tablet Commonly known as: ZYRTEC TAKE 1 TABLET(10 MG) BY MOUTH DAILY   clotrimazole-betamethasone cream Commonly known as: LOTRISONE Apply to affected area 2 times daily prn   Depakote 500 MG DR tablet Generic  drug: divalproex TAKE 1 TABLET BY MOUTH IN THE MORNING AND 2 TABLETS AT NIGHT What changed: additional instructions Changed by: Elveria Rising, NP   famotidine 20 MG tablet Commonly known as: PEPCID TAKE 1 TABLET(20 MG) BY MOUTH EVERY 12 HOURS   fluconazole 150 MG tablet Commonly known as: DIFLUCAN Take 150 mg by mouth as needed (when on antibiotics).   fluticasone 50 MCG/ACT nasal spray Commonly known as: Flonase Place 1 spray into both nostrils daily.   formoterol 20 MCG/2ML  nebulizer solution Commonly known as: Perforomist USE 1 VIAL VIA NEBULIZER TWICE DAILY   hydrOXYzine 25 MG tablet Commonly known as: ATARAX/VISTARIL Take 25 mg by mouth 3 (three) times daily as needed (pain).   Levonorgestrel-Ethinyl Estradiol 0.15-0.03 &0.01 MG tablet Commonly known as: Seasonique Take 1 tablet by mouth daily.   melatonin 3 MG Tabs tablet Take 2 tablets (6mg ) by mouth at bedtime.   mometasone 50 MCG/ACT nasal spray Commonly known as: NASONEX Use one spray in each nostril once daily as directed.   montelukast 10 MG tablet Commonly known as: SINGULAIR TAKE 1 TABLET(10 MG) BY MOUTH AT BEDTIME   Nebulizer Misc Adult mask with tubing. Use as directed with nebulizer device.   nystatin 100000 UNITS/ML Susp Commonly known as: MYCOSTATIN swish and swallow 3 times a day for 10 days.   nystatin powder Commonly known as: MYCOSTATIN/NYSTOP Apply 1 application topically 3 (three) times daily.   ondansetron 4 MG disintegrating tablet Commonly known as: Zofran ODT Take 1 tablet (4 mg total) by mouth every 8 (eight) hours as needed for nausea. What changed: how much to take   polyethylene glycol powder 17 GM/SCOOP powder Commonly known as: GLYCOLAX/MIRALAX DISSOLVE 1 CAPFULL IN AT LEAST 8 OZ WATER/JUICE TWICE DAILY. What changed: additional instructions Changed by: Beverley FiedlerJay M Pyrtle, MD   predniSONE 20 MG tablet Commonly known as: Deltasone Take 1 tablet (20 mg total) by mouth daily  with breakfast.   promethazine 25 MG tablet Commonly known as: PHENERGAN Take 1 tablet (25 mg total) by mouth every 6 (six) hours as needed for nausea or vomiting.   senna 8.6 MG tablet Commonly known as: SENOKOT Take 1 tablet by mouth at bedtime.   terconazole 0.4 % vaginal cream Commonly known as: TERAZOL 7 Place 1 applicator vaginally at bedtime. What changed:   when to take this  reasons to take this   traZODone 50 MG tablet Commonly known as: DESYREL TAKE 2 TABLETS(100 MG) BY MOUTH AT BEDTIME       Total time spent with the patient was 20 minutes, of which 50% or more was spent in counseling and coordination of care.  Elveria Risingina Madell Heino NP-C [facility] Ph. [phone] Fax [phone]

## 2020-07-05 NOTE — Patient Instructions (Signed)
Thank you for coming in today.   Instructions for you until your next appointment are as follows: 1. Continue giving Lori Miles the seizure medicine (Depakote) as prescribed.  2. Let me know if she has any seizures 3. Be sure to follow up with her other doctors.  4. Please sign up for MyChart if you have not done so. 5. Please plan to return for follow up in 6 months or sooner if needed.  At [facility], we are committed to providing exceptional care. You will receive a patient satisfaction survey through text or email regarding your visit today. Your opinion is important to me. Comments are appreciated.

## 2020-07-08 ENCOUNTER — Encounter (INDEPENDENT_AMBULATORY_CARE_PROVIDER_SITE_OTHER): Payer: Self-pay | Admitting: Family

## 2020-07-08 MED ORDER — TRAZODONE HCL 50 MG PO TABS
ORAL_TABLET | ORAL | 5 refills | Status: DC
Start: 2020-07-08 — End: 2021-02-02

## 2020-07-10 ENCOUNTER — Ambulatory Visit: Payer: Medicare Other | Admitting: Physical Therapy

## 2020-07-10 ENCOUNTER — Other Ambulatory Visit: Payer: Self-pay

## 2020-07-10 DIAGNOSIS — R2689 Other abnormalities of gait and mobility: Secondary | ICD-10-CM

## 2020-07-10 DIAGNOSIS — R2681 Unsteadiness on feet: Secondary | ICD-10-CM | POA: Diagnosis not present

## 2020-07-10 DIAGNOSIS — M6281 Muscle weakness (generalized): Secondary | ICD-10-CM | POA: Diagnosis not present

## 2020-07-11 ENCOUNTER — Ambulatory Visit: Payer: Medicare Other | Admitting: Physical Therapy

## 2020-07-11 NOTE — Therapy (Signed)
[facility] [address] [place], [place], [address] Phone: [phone]   Fax:  [phone]  Physical Therapy Treatment  Patient Details  Name: Lori Miles MRN: [id number] Date of Birth: Nov 26, 1995 Referring Provider (PT): Elveria Rising, NP   Encounter Date: 07/10/2020   PT End of Session - 07/11/20 2056    Visit Number 5    Number of Visits 7    Date for PT Re-Evaluation 07/21/20    Authorization Type [facility] Medicare/Medicaid    Authorization Time Period 06-05-20 - 08-05-20    PT Start Time 0800    PT Stop Time 0846    PT Time Calculation (min) 46 min    Activity Tolerance Patient tolerated treatment well    Behavior During Therapy [facility] for tasks assessed/performed           Past Medical History:  Diagnosis Date  . Asthma   . Autism    mom states no actual dx, but admits she has some symptoms  . Bacteriuria   . Chronic constipation   . Cognitive developmental delay   . Constipation   . COVID-19 03/2020  . Disruptive behavior disorder   . Dyspnea    if she walks much  . Family history of adverse reaction to anesthesia     "hives"-? if anesthesia did take antibiotic  . Frequency-urgency syndrome   . Gait disorder    ORGANIC PER NERUOLGIST NOTE (DR HICKLING)  . 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 GERD (gastroesophageal reflux disease)   . Gout   . H/O sinus tachycardia   . History of acute respiratory failure 02/14/2015   SECONDARY TO CAP  . History of recurrent UTIs   . Incontinent of feces   . Interstitial cystitis   . Intertrigo 11/29/2019  . Moderate intellectual disability   . OSA (obstructive sleep apnea)    no machine yet   . Partial epilepsy with impairment of consciousness (HCC)    FOLLOWED BY DR HICKLING  . Pneumonia 2    4 times this year ( 2020)  . PONV (postoperative nausea and vomiting)   . Renal cyst 11/29/2019  . Seizure (HCC)    most recent sz  " at the end of summer"-last sz years ago per mother  . Urinary incontinence     Past Surgical History:  Procedure Laterality Date  . CYSTOSCOPY N/A 06/03/2017   Procedure: CYSTOSCOPY WITH EXAM UNDER ANESTHESIA;  Surgeon: Jerilee Field, MD;  Location: [facility];  Service: Urology;  Laterality: N/A;  . CYSTOSCOPY  2020  . CYSTOSCOPY WITH HYDRODISTENSION AND BIOPSY  04/14/2012   Procedure: CYSTOSCOPY/BIOPSY/HYDRODISTENSION;  Surgeon: Lindaann Slough, MD;  Location: [facility];  Service: Urology;;  instillation of marcaine and pyridium  . EUA/  PAP SMEAR/  BREAST EXAM  03-12-2016   dr Erin Fulling [facility]  . EUA/ VAGINOSCOPY/ COLPOSCOPY FO THE HYMENAL RING  10-04-2009    dr Tamela Oddi  [facility]  . IR RADIOLOGIST EVAL & MGMT  07/29/2019  . IR RADIOLOGIST EVAL & MGMT  09/30/2019  . IR SCLEROTHERAPY OF A FLUID COLLECTION  09/08/2019  . RADIOLOGY WITH ANESTHESIA N/A 09/08/2019   Procedure: IR Sclerotherapy Treatment;  Surgeon: Radiologist, Medication, MD;  Location: [facility];  Service: Radiology;  Laterality: N/A;  . TONSILLECTOMY      There were no vitals filed for this visit.  [facility] Adult PT Treatment/Exercise - 07/11/20 0001      Transfers   Transfers Sit to Stand    Number of Reps Other reps (comment)   5 reps   Comments on blue Airex - without UE support from mat table      Ambulation/Gait   Ambulation/Gait Yes    Ambulation Distance (Feet) 345 Feet   230', 230'   Assistive device None    Gait Pattern Step-through pattern   LLE is internally rotated   Ambulation Surface Level;Indoor      Neuro Re-ed    Neuro Re-ed Details  Pt performed standing balance activities - cone taps to 3 cones; progressing to tipping cone over and standing upright with CGA      Knee/Hip Exercises: Stretches   Active Hamstring Stretch Right;Left;1 rep;30 seconds      Knee/Hip Exercises: Aerobic   Recumbent Bike SciFit level 2.5 x 5"       Knee/Hip Exercises: Machines for Strengthening   Total Gym Leg Press 50# bil. LE's 3 sets 10 reps      Knee/Hip Exercises: Standing   Hip Flexion Stengthening;Right;Left;10 reps;2 sets;Knee bent   10 reps with knee flexed, 10 reps with knee extended   Hip Abduction Stengthening;Both;1 set;10 reps;Knee straight   3# weight on each leg   Hip Extension Stengthening;Right;Left;1 set;10 reps;Knee straight   3# weight   Forward Step Up Right;Left;1 set;10 reps;Hand Hold: 1;Step Height: 6"               Balance Exercises - 07/11/20 0001      Balance Exercises: Standing   Rockerboard Anterior/posterior;EO;Intermittent UE support;Other reps (comment)   20 reps   Marching Foam/compliant surface;Static;10 reps   with CGA to min assist   Other Standing Exercises ladder on floor to increase step length 4 reps with min hand held assist                  PT Long Term Goals - 07/11/20 2052      PT LONG TERM GOAL #1   Title Pt. will amb. 500' on flat, even surface without LOB.      Time 6    Period Weeks    Status New      PT LONG TERM GOAL #2   Title Pt will perform at least 10" on recumbent bike nonstop for improved endurance/activity tolerance.    Time 6    Period Weeks    Status New      PT LONG TERM GOAL #3   Title Caregiver will correctly assist with HEP, including walking program,  to assist with LE strengthening and balance.    Time 6    Period Weeks    Status New      PT LONG TERM GOAL #4   Title Improve 5x sit to stand score from 19.72 secs to </= 15 secs to demo improved LE strength.    Baseline 19.72 secs    Time 6    Period Weeks    Status New      PT LONG TERM GOAL #5   Title Pt will improve TUG score from 22.44 secs to </= 16 secs without device to demo improved functional mobility.    Baseline 22.44 secs without device    Time 6    Period Weeks    Status New                 Plan - 07/11/20 2048  Clinical Impression Statement Pt is  progressing well towards goals; continues to require short seated rest breaks during session.  Cont with POC.    Personal Factors and Comorbidities Behavior Pattern;Comorbidity 1;Past/Current Experience;Fitness;Time since onset of injury/illness/exacerbation    Comorbidities autism, developmental delay, renal cyst    Examination-Activity Limitations Locomotion Level;Transfers;Carry;Stand;Stairs;Squat    Examination-Participation Restrictions Meal Prep;Cleaning;Laundry;Interpersonal Relationship;Shop    Stability/Clinical Decision Making Stable/Uncomplicated    Rehab Potential Fair    PT Frequency 1x / week    PT Duration 6 weeks   eval + 6 visits   PT Treatment/Interventions ADLs/Self Care Home Management;Gait training;Stair training;Therapeutic activities;Therapeutic exercise;Balance training;Neuromuscular re-education    PT Next Visit Plan LE strengthening and balance training    Consulted and Agree with Plan of Care Patient;Family member/caregiver    Family Member Consulted mother           Patient will benefit from skilled therapeutic intervention in order to improve the following deficits and impairments:  Abnormal gait,Decreased balance,Decreased strength,Pain  Visit Diagnosis: Other abnormalities of gait and mobility  Muscle weakness (generalized)  Unsteadiness on feet     Problem List Patient Active Problem List   Diagnosis Date Noted  . Renal cyst 11/29/2019  . Intertrigo 11/29/2019  . Complex care coordination 05/18/2019  . Screening for cervical cancer 02/26/2019  . Glucosuria 06/01/2018  . 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 Not well controlled moderate persistent asthma 04/29/2018  . Gastroesophageal reflux disease 04/29/2018  . Recurrent infections 04/29/2018  . Dysuria 02/20/2018  . Chronic rhinitis 04/03/2017  . Recurrent falls 03/21/2017  . Weakness 03/21/2017  . Venous insufficiency 03/21/2017  . OSA on CPAP 09/30/2016  . Excessive daytime  sleepiness 03/05/2016  . Insomnia 10/04/2015  . Bronchitis 05/05/2015  . 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 Cough variant asthma vs UACS/ vcd  03/15/2015  . Pneumonia 02/14/2015  . Autism spectrum disorder 02/14/2015  . Nausea with vomiting 02/14/2015  . Obesity, morbid (HCC) 12/13/2014  . 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 Acanthosis nigricans 11/14/2014  . Toeing-in 08/29/2014  . 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 Recurrent urinary tract infection 08/01/2011  . 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 Chronic constipation 08/01/2011  . Contraception 08/01/2011    Kary Kos, PT 07/11/2020, 8:56 PM  [facility] [address] [place], [place], [address] Phone: [phone]   Fax:  [phone]  Name: Lori Miles MRN: [id number] Date of Birth: 02-12-1996

## 2020-07-17 ENCOUNTER — Ambulatory Visit: Payer: Medicare Other | Attending: Family | Admitting: Physical Therapy

## 2020-07-17 ENCOUNTER — Encounter: Payer: Self-pay | Admitting: Physical Therapy

## 2020-07-17 ENCOUNTER — Other Ambulatory Visit: Payer: Self-pay

## 2020-07-17 DIAGNOSIS — M6281 Muscle weakness (generalized): Secondary | ICD-10-CM

## 2020-07-17 DIAGNOSIS — R2689 Other abnormalities of gait and mobility: Secondary | ICD-10-CM | POA: Diagnosis not present

## 2020-07-17 DIAGNOSIS — R2681 Unsteadiness on feet: Secondary | ICD-10-CM

## 2020-07-17 DIAGNOSIS — R569 Unspecified convulsions: Secondary | ICD-10-CM | POA: Diagnosis not present

## 2020-07-17 DIAGNOSIS — Z049 Encounter for examination and observation for unspecified reason: Secondary | ICD-10-CM | POA: Diagnosis not present

## 2020-07-17 NOTE — Therapy (Signed)
[facility] [address] [place], [place], [address] Phone: [phone]   Fax:  [phone]  Physical Therapy Treatment  Patient Details  Name: Lori Miles MRN: [id number] Date of Birth: [birth year] Referring Provider (PT): Elveria Rising, NP   Encounter Date: 07/17/2020   PT End of Session - 07/17/20 0925    Visit Number 6    Number of Visits 7    Date for PT Re-Evaluation 07/21/20    Authorization Type [facility] Medicare/Medicaid    Authorization Time Period 06-05-20 - 08-05-20    PT Start Time 0800    PT Stop Time 0845    PT Time Calculation (min) 45 min    Activity Tolerance Patient tolerated treatment well    Behavior During Therapy [facility] for tasks assessed/performed           Past Medical History:  Diagnosis Date  . Asthma   . Autism    mom states no actual dx, but admits she has some symptoms  . Bacteriuria   . Chronic constipation   . Cognitive developmental delay   . Constipation   . COVID-19 03/2020  . Disruptive behavior disorder   . Dyspnea    if she walks much  . Family history of adverse reaction to anesthesia     "hives"-? if anesthesia did take antibiotic  . Frequency-urgency syndrome   . Gait disorder    ORGANIC PER NERUOLGIST NOTE (DR HICKLING)  . 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 GERD (gastroesophageal reflux disease)   . Gout   . H/O sinus tachycardia   . History of acute respiratory failure 02/14/2015   SECONDARY TO CAP  . History of recurrent UTIs   . Incontinent of feces   . Interstitial cystitis   . Intertrigo 11/29/2019  . Moderate intellectual disability   . OSA (obstructive sleep apnea)    no machine yet   . Partial epilepsy with impairment of consciousness (HCC)    FOLLOWED BY DR HICKLING  . Pneumonia 2    4 times this year ( 2020)  . PONV (postoperative nausea and vomiting)   . Renal cyst 11/29/2019  . Seizure (HCC)    most recent sz  " at the end of summer"-last sz years ago per mother  . Urinary incontinence     Past Surgical History:  Procedure Laterality Date  . CYSTOSCOPY N/A 06/03/2017   Procedure: CYSTOSCOPY WITH EXAM UNDER ANESTHESIA;  Surgeon: Jerilee Field, MD;  Location: [facility];  Service: Urology;  Laterality: N/A;  . CYSTOSCOPY  2020  . CYSTOSCOPY WITH HYDRODISTENSION AND BIOPSY  04/14/2012   Procedure: CYSTOSCOPY/BIOPSY/HYDRODISTENSION;  Surgeon: Lindaann Slough, MD;  Location: [facility];  Service: Urology;;  instillation of marcaine and pyridium  . EUA/  PAP SMEAR/  BREAST EXAM  03-12-2016   dr Erin Fulling [facility]  . EUA/ VAGINOSCOPY/ COLPOSCOPY FO THE HYMENAL RING  10-04-2009    dr Tamela Oddi  [facility]  . IR RADIOLOGIST EVAL & MGMT  07/29/2019  . IR RADIOLOGIST EVAL & MGMT  09/30/2019  . IR SCLEROTHERAPY OF A FLUID COLLECTION  09/08/2019  . RADIOLOGY WITH ANESTHESIA N/A 09/08/2019   Procedure: IR Sclerotherapy Treatment;  Surgeon: Radiologist, Medication, MD;  [facility];  Service: Radiology;  Laterality: N/A;  . TONSILLECTOMY      There were no vitals filed for this visit.   Subjective Assessment - 07/17/20 0804    Subjective Mother reports pt still  has UTI - has been waiting on call back from MD but states it has been almost 2 weeks since specimen was given and still hasn't heard back from the MD; mother states pt almost fell on Saturday - her sister had to catch her to prevent her from falling; states her legs tremble and shake when she has a UTI    Patient is accompained by: Family member   mother   Pertinent History autism, cognitive developmental delay, partial epilepsy, renal cyst    Patient Stated Goals improve balance and LE strength and walking    Currently in Pain? No/denies    Pain Onset Rodney CruiseYesterday                             [facility] Adult PT Treatment/Exercise - 07/17/20 0808      Transfers   Transfers Sit to Stand;Stand to Sit     Sit to Stand 5: Supervision    Number of Reps Other reps (comment)   5   Comments feet on blue Airex - no UE support used      Ambulation/Gait   Ambulation/Gait Yes    Ambulation Distance (Feet) 230 Feet   230   Assistive device Rollator    Gait Pattern Step-through pattern   LLE is internally rotated   Ambulation Surface Level;Indoor      Knee/Hip Exercises: Aerobic   Nustep Level 4 x 3"; Level 2 x 2" for 5" total = with UE's & LE's      Knee/Hip Exercises: Machines for Strengthening   Total Gym Leg Press 50# bil. LE's 3 sets 10 reps      Knee/Hip Exercises: Standing   Forward Step Up Right;Left;1 set;5 reps;Hand Hold: 1;Step Height: 6"               Balance Exercises - 07/17/20 0001      Balance Exercises: Standing   Rockerboard Anterior/posterior;EO;Other reps (comment);UE support (P)    20 reps; bil. UE suppport 1st set 10 reps:  no UE support 2nd set 10 reps   Marching Foam/compliant surface;Static;10 reps (P)    with CGA to min assist   Other Standing Exercises Comments pt performed stepping over and (P)           Pt performed balance activity with various activities - ladder on floor to improve SLS and to increase step length, cones (4) To increase balance with turning (weaving in/out of cones) and performing cone taps to each one - then tipping over and standing upright And stepping stones (4) to increased balance on each leg and to improve feedforward balance reactions - pt performed with CGA  for safety        PT Long Term Goals - 07/17/20 0921      PT LONG TERM GOAL #1   Title Pt. will amb. 500' on flat, even surface without LOB.      Time 6    Period Weeks    Status New      PT LONG TERM GOAL #2   Title Pt will perform at least 10" on recumbent bike nonstop for improved endurance/activity tolerance.    Time 6    Period Weeks    Status New      PT LONG TERM GOAL #3   Title Caregiver will correctly assist with HEP, including walking program,  to  assist with LE strengthening and balance.    Time 6  Period Weeks    Status New      PT LONG TERM GOAL #4   Title Improve 5x sit to stand score from 19.72 secs to </= 15 secs to demo improved LE strength.    Baseline 19.72 secs    Time 6    Period Weeks    Status New      PT LONG TERM GOAL #5   Title Pt will improve TUG score from 22.44 secs to </= 16 secs without device to demo improved functional mobility.    Baseline 22.44 secs without device    Time 6    Period Weeks    Status New                 Plan - 07/17/20 8786    Clinical Impression Statement Pt is progressing well towards goals; able to use rollator for assistance with prolonged community ambulation distances, but also able to amb. 230' in clinic today without use of rollator.  Pt had no LOB with balance activities in today's session - plan D/C next session.    Personal Factors and Comorbidities Behavior Pattern;Comorbidity 1;Past/Current Experience;Fitness;Time since onset of injury/illness/exacerbation    Comorbidities autism, developmental delay, renal cyst    Examination-Activity Limitations Locomotion Level;Transfers;Carry;Stand;Stairs;Squat    Examination-Participation Restrictions Meal Prep;Cleaning;Laundry;Interpersonal Relationship;Shop    Stability/Clinical Decision Making Stable/Uncomplicated    Rehab Potential Fair    PT Frequency 1x / week    PT Duration 6 weeks   eval + 6 visits   PT Treatment/Interventions ADLs/Self Care Home Management;Gait training;Stair training;Therapeutic activities;Therapeutic exercise;Balance training;Neuromuscular re-education    PT Next Visit Plan LE strengthening and balance training    Consulted and Agree with Plan of Care Patient;Family member/caregiver    Family Member Consulted mother           Patient will benefit from skilled therapeutic intervention in order to improve the following deficits and impairments:  Abnormal gait,Decreased balance,Decreased  strength,Pain  Visit Diagnosis: Other abnormalities of gait and mobility  Muscle weakness (generalized)  Unsteadiness on feet     Problem List Patient Active Problem List   Diagnosis Date Noted  . Renal cyst 11/29/2019  . Intertrigo 11/29/2019  . Complex care coordination 05/18/2019  . Screening for cervical cancer 02/26/2019  . Glucosuria 06/01/2018  . 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 Not well controlled moderate persistent asthma 04/29/2018  . Gastroesophageal reflux disease 04/29/2018  . Recurrent infections 04/29/2018  . Dysuria 02/20/2018  . Chronic rhinitis 04/03/2017  . Recurrent falls 03/21/2017  . Weakness 03/21/2017  . Venous insufficiency 03/21/2017  . OSA on CPAP 09/30/2016  . Excessive daytime sleepiness 03/05/2016  . Insomnia 10/04/2015  . Bronchitis 05/05/2015  . 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 Cough variant asthma vs UACS/ vcd  03/15/2015  . Pneumonia 02/14/2015  . Autism spectrum disorder 02/14/2015  . Nausea with vomiting 02/14/2015  . Obesity, morbid (HCC) 12/13/2014  . 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 Acanthosis nigricans 11/14/2014  . Toeing-in 08/29/2014  . 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 Recurrent urinary tract infection 08/01/2011  . 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 Chronic constipation 08/01/2011  . Contraception 08/01/2011    Kary Kos, PT 07/17/2020, 9:29 AM  [facility] [address]  [place], [place], [address] Phone: [phone]   Fax:  [phone]  Name: NNEOMA HARRAL MRN: [id number] Date of Birth: Aug 25, 1995

## 2020-07-18 DIAGNOSIS — N390 Urinary tract infection, site not specified: Secondary | ICD-10-CM | POA: Diagnosis not present

## 2020-07-21 ENCOUNTER — Other Ambulatory Visit: Payer: Self-pay | Admitting: Family Medicine

## 2020-07-23 ENCOUNTER — Encounter (INDEPENDENT_AMBULATORY_CARE_PROVIDER_SITE_OTHER): Payer: Self-pay

## 2020-07-24 ENCOUNTER — Ambulatory Visit: Payer: Medicare Other | Admitting: Physical Therapy

## 2020-07-24 ENCOUNTER — Other Ambulatory Visit: Payer: Self-pay

## 2020-07-24 DIAGNOSIS — R2689 Other abnormalities of gait and mobility: Secondary | ICD-10-CM

## 2020-07-24 DIAGNOSIS — M6281 Muscle weakness (generalized): Secondary | ICD-10-CM | POA: Diagnosis not present

## 2020-07-24 DIAGNOSIS — R2681 Unsteadiness on feet: Secondary | ICD-10-CM

## 2020-07-25 NOTE — Therapy (Signed)
[facility] [address] [place], [place], [address] Phone: [phone]   Fax:  [phone]  Physical Therapy Treatment & Discharge Summary  Patient Details  Name: Lori Miles MRN: [id number] Date of Birth: [birth year] Referring Provider (PT): Rockwell Germany, NP   Encounter Date: 07/24/2020   PT End of Session - 07/25/20 2027    Visit Number 7    Number of Visits 7    Date for PT Re-Evaluation 07/21/20    Authorization Type [facility] Medicare/Medicaid    Authorization Time Period 06-05-20 - 08-05-20    PT Start Time 0800    PT Stop Time 0845    PT Time Calculation (min) 45 min    Activity Tolerance Patient tolerated treatment well    Behavior During Therapy [facility] for tasks assessed/performed           Past Medical History:  Diagnosis Date  . Asthma   . Autism    mom states no actual dx, but admits she has some symptoms  . Bacteriuria   . Chronic constipation   . Cognitive developmental delay   . Constipation   . COVID-19 03/2020  . Disruptive behavior disorder   . Dyspnea    if she walks much  . Family history of adverse reaction to anesthesia     "hives"-? if anesthesia did take antibiotic  . Frequency-urgency syndrome   . Gait disorder    ORGANIC PER NERUOLGIST NOTE (DR HICKLING)  . Generalized convulsive epilepsy ([place]) NEUROLOGIST-  DR HICKLING  . GERD (gastroesophageal reflux disease)   . Gout   . H/O sinus tachycardia   . History of acute respiratory failure 02/14/2015   SECONDARY TO CAP  . History of recurrent UTIs   . Incontinent of feces   . Interstitial cystitis   . Intertrigo 11/29/2019  . Moderate intellectual disability   . OSA (obstructive sleep apnea)    no machine yet   . Partial epilepsy with impairment of consciousness ([place])    FOLLOWED BY DR HICKLING  . Pneumonia 2    4 times this year ( 2020)  . PONV (postoperative nausea and vomiting)   . Renal cyst 11/29/2019  . Seizure ([place])     most recent sz " at the end of summer"-last sz years ago per mother  . Urinary incontinence     Past Surgical History:  Procedure Laterality Date  . CYSTOSCOPY N/A 06/03/2017   Procedure: CYSTOSCOPY WITH EXAM UNDER ANESTHESIA;  Surgeon: Festus Aloe, MD;  Location: [facility];  Service: Urology;  Laterality: N/A;  . CYSTOSCOPY  2020  . CYSTOSCOPY WITH HYDRODISTENSION AND BIOPSY  04/14/2012   Procedure: CYSTOSCOPY/BIOPSY/HYDRODISTENSION;  Surgeon: Hanley Ben, MD;  Location: [facility];  Service: Urology;;  instillation of marcaine and pyridium  . EUA/  PAP SMEAR/  BREAST EXAM  03-12-2016   dr Ihor Dow [facility]  . EUA/ VAGINOSCOPY/ COLPOSCOPY FO THE HYMENAL RING  10-04-2009    dr Delsa Sale  [facility]  . IR RADIOLOGIST EVAL & MGMT  07/29/2019  . IR RADIOLOGIST EVAL & MGMT  09/30/2019  . IR SCLEROTHERAPY OF A FLUID COLLECTION  09/08/2019  . RADIOLOGY WITH ANESTHESIA N/A 09/08/2019   Procedure: IR Sclerotherapy Treatment;  Surgeon: Radiologist, Medication, MD;  Location: [facility];  Service: Radiology;  Laterality: N/A;  . TONSILLECTOMY      There were no vitals filed for this visit.   Subjective Assessment - 07/25/20 2007    Subjective Mother  reports pt is now on Keflex for the UTI; no other problems reported; Mother states case manager, Jonelle Sidle said to send letter for the bike (recumbent) that I recommend    Patient is accompained by: Family member   mother   Pertinent History autism, cognitive developmental delay, partial epilepsy, renal cyst    Patient Stated Goals improve balance and LE strength and walking    Currently in Pain? No/denies    Pain Onset Yesterday                             [facility] Adult PT Treatment/Exercise - 07/25/20 0001      Transfers   Transfers Sit to Stand;Stand to Sit    Sit to Stand 5: Supervision    Number of Reps Other reps (comment)   5   Comments feet on blue Airex - no UE support used       Ambulation/Gait   Ambulation/Gait Yes    Ambulation Distance (Feet) 345 Feet   230', 230'   Assistive device None    Gait Pattern Step-through pattern   LLE is internally rotated   Ambulation Surface Level;Indoor      Knee/Hip Exercises: Aerobic   Recumbent Bike SciFit level 3.0 x 5" with UE's & LE's      Knee/Hip Exercises: Machines for Strengthening   Total Gym Leg Press 50# bil. LE's 3 sets 10 reps               Balance Exercises - 07/25/20 0001      Balance Exercises: Standing   Rockerboard Anterior/posterior;EO;Other reps (comment);UE support   20 reps; bil. UE suppport 1st set 10 reps:  no UE support 2nd set 10 reps   Marching Foam/compliant surface;Static;10 reps   with CGA to min assist   Other Standing Exercises Pt performed obstacle course - stepping on stepping stones for various heights and to facilitate SLS on each leg; cone taps to 4 cones, then tipping cone over and standing upright for improved coordination and SLS on each leg                  PT Long Term Goals - 07/24/20 0803      PT LONG TERM GOAL #1   Title Pt. will amb. 500' on flat, even surface without LOB.      Baseline met 07-24-20    Time 6    Period Weeks    Status Achieved      PT LONG TERM GOAL #2   Title Pt will perform at least 10" on recumbent bike nonstop for improved endurance/activity tolerance.    Baseline pt performed 5" nonstop on bike - not complete 10" due to time constraint    Time 6    Period Weeks    Status Partially Met      PT LONG TERM GOAL #3   Title Caregiver will correctly assist with HEP, including walking program,  to assist with LE strengthening and balance.    Baseline met 07-24-20    Time 6    Period Weeks    Status Achieved      PT LONG TERM GOAL #4   Title Improve 5x sit to stand score from 19.72 secs to </= 15 secs to demo improved LE strength.    Baseline 19.72 secs;  15.81 secs without UE support;    15.69 secs - 07-24-20    Time 6  Period Weeks     Status Partially Met      PT LONG TERM GOAL #5   Title Pt will improve TUG score from 22.44 secs to </= 16 secs without device to demo improved functional mobility.    Baseline 22.44 secs without device;  07-24-20 --  15.34 secs without device    Time 6    Period Weeks    Status Achieved                 Plan - 07/25/20 2031    Clinical Impression Statement Pt has met 5/5 LTG's - pt is doing well with mobility and balance at this time.  Pt uses rollator for assistance with prolonged community ambulation but is able to amb. independently for household and short community ambulation distances.  No further needs identified at this time.    Personal Factors and Comorbidities Behavior Pattern;Comorbidity 1;Past/Current Experience;Fitness;Time since onset of injury/illness/exacerbation    Comorbidities autism, developmental delay, renal cyst    Examination-Activity Limitations Locomotion Level;Transfers;Carry;Stand;Stairs;Squat    Examination-Participation Restrictions Meal Prep;Cleaning;Laundry;Interpersonal Relationship;Shop    Stability/Clinical Decision Making Stable/Uncomplicated    Rehab Potential Fair    PT Frequency 1x / week    PT Duration 6 weeks   eval + 6 visits   PT Treatment/Interventions ADLs/Self Care Home Management;Gait training;Stair training;Therapeutic activities;Therapeutic exercise;Balance training;Neuromuscular re-education    PT Next Visit Plan N/A - D/C on 07-24-20    Consulted and Agree with Plan of Care Patient;Family member/caregiver    Family Member Consulted mother           Patient will benefit from skilled therapeutic intervention in order to improve the following deficits and impairments:  Abnormal gait,Decreased balance,Decreased strength,Pain  Visit Diagnosis: Other abnormalities of gait and mobility  Muscle weakness (generalized)  Unsteadiness on feet     Problem List Patient Active Problem List   Diagnosis Date Noted  . Renal cyst  11/29/2019  . Intertrigo 11/29/2019  . Complex care coordination 05/18/2019  . Screening for cervical cancer 02/26/2019  . Glucosuria 06/01/2018  . Steroid-induced diabetes ([place]) 06/01/2018  . Not well controlled moderate persistent asthma 04/29/2018  . Gastroesophageal reflux disease 04/29/2018  . Recurrent infections 04/29/2018  . Dysuria 02/20/2018  . Chronic rhinitis 04/03/2017  . Recurrent falls 03/21/2017  . Weakness 03/21/2017  . Venous insufficiency 03/21/2017  . OSA on CPAP 09/30/2016  . Excessive daytime sleepiness 03/05/2016  . Insomnia 10/04/2015  . Bronchitis 05/05/2015  . 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 Cough variant asthma vs UACS/ vcd  03/15/2015  . Pneumonia 02/14/2015  . Autism spectrum disorder 02/14/2015  . Nausea with vomiting 02/14/2015  . Obesity, morbid ([place]) 12/13/2014  . 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 Acanthosis nigricans 11/14/2014  . Toeing-in 08/29/2014  . Generalized convulsive epilepsy ([place]) 09/28/2012  . Partial epilepsy with impairment of consciousness ([place]) 09/28/2012  . 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 Recurrent urinary tract infection 08/01/2011  . COPD with asthma ([place]) 08/01/2011  . Chronic constipation 08/01/2011  . Contraception 08/01/2011    PHYSICAL THERAPY DISCHARGE SUMMARY  Visits from Start of Care: 7  Current functional level related to goals / functional outcomes: See above for progress towards goals    Remaining deficits: Continued decreased high level balance skills Continued gait deviations with LLE internally rotated - pt able to position Lt leg in neutral position with verbal cues  Education / Equipment: Pt and mother have been instructed in HEP for strengthening and balance exercises; walking program has been recommended Plan: Patient agrees to discharge.  Patient goals were met.  Patient is being discharged due to meeting the stated rehab goals.  ?????   No further needs identified at this time         Alda Lea, PT 07/25/2020, 8:37 PM  [facility] [address] [place], [place], [address] Phone: [phone]   Fax:  [phone]  Name: Lori Miles MRN: [id number] Date of Birth: 1995-04-28

## 2020-07-28 DIAGNOSIS — E7800 Pure hypercholesterolemia, unspecified: Secondary | ICD-10-CM | POA: Diagnosis not present

## 2020-07-28 DIAGNOSIS — N390 Urinary tract infection, site not specified: Secondary | ICD-10-CM | POA: Diagnosis not present

## 2020-07-28 DIAGNOSIS — J4520 Mild intermittent asthma, uncomplicated: Secondary | ICD-10-CM | POA: Diagnosis not present

## 2020-07-28 DIAGNOSIS — E559 Vitamin D deficiency, unspecified: Secondary | ICD-10-CM | POA: Diagnosis not present

## 2020-07-28 DIAGNOSIS — R5383 Other fatigue: Secondary | ICD-10-CM | POA: Diagnosis not present

## 2020-07-28 DIAGNOSIS — Z0000 Encounter for general adult medical examination without abnormal findings: Secondary | ICD-10-CM | POA: Diagnosis not present

## 2020-07-28 DIAGNOSIS — Z79899 Other long term (current) drug therapy: Secondary | ICD-10-CM | POA: Diagnosis not present

## 2020-07-31 DIAGNOSIS — K219 Gastro-esophageal reflux disease without esophagitis: Secondary | ICD-10-CM | POA: Diagnosis not present

## 2020-07-31 DIAGNOSIS — J4540 Moderate persistent asthma, uncomplicated: Secondary | ICD-10-CM | POA: Diagnosis not present

## 2020-07-31 DIAGNOSIS — R269 Unspecified abnormalities of gait and mobility: Secondary | ICD-10-CM | POA: Diagnosis not present

## 2020-07-31 DIAGNOSIS — K5909 Other constipation: Secondary | ICD-10-CM | POA: Diagnosis not present

## 2020-07-31 DIAGNOSIS — N390 Urinary tract infection, site not specified: Secondary | ICD-10-CM | POA: Diagnosis not present

## 2020-07-31 DIAGNOSIS — J329 Chronic sinusitis, unspecified: Secondary | ICD-10-CM | POA: Diagnosis not present

## 2020-07-31 DIAGNOSIS — E559 Vitamin D deficiency, unspecified: Secondary | ICD-10-CM | POA: Diagnosis not present

## 2020-08-02 DIAGNOSIS — J4520 Mild intermittent asthma, uncomplicated: Secondary | ICD-10-CM | POA: Diagnosis not present

## 2020-08-03 ENCOUNTER — Other Ambulatory Visit: Payer: Self-pay | Admitting: Allergy and Immunology

## 2020-08-07 ENCOUNTER — Telehealth: Payer: Self-pay | Admitting: Allergy and Immunology

## 2020-08-07 MED ORDER — ALBUTEROL SULFATE (2.5 MG/3ML) 0.083% IN NEBU: 2.5000 mg | INHALATION_SOLUTION | RESPIRATORY_TRACT | 1 refills | Status: DC | PRN

## 2020-08-07 NOTE — Telephone Encounter (Signed)
Patient's mom called and said the pharmacy called her to say we denied Lori Miles's albuterol solution. Last seen 05/09/20. She said Lori Miles does not need it now, but the pharmacy likes to keep in on hand for her when she does need it. She would like to know why it was denied. [facility] on [address].

## 2020-08-07 NOTE — Telephone Encounter (Signed)
Called and spoke to patient's mother and informed her that a refill has been sent. Mother verbally expressed understanding and was pleased.

## 2020-08-08 DIAGNOSIS — N390 Urinary tract infection, site not specified: Secondary | ICD-10-CM | POA: Diagnosis not present

## 2020-08-17 DIAGNOSIS — R569 Unspecified convulsions: Secondary | ICD-10-CM | POA: Diagnosis not present

## 2020-08-17 DIAGNOSIS — Z049 Encounter for examination and observation for unspecified reason: Secondary | ICD-10-CM | POA: Diagnosis not present

## 2020-08-22 DIAGNOSIS — R569 Unspecified convulsions: Secondary | ICD-10-CM | POA: Diagnosis not present

## 2020-08-22 DIAGNOSIS — N390 Urinary tract infection, site not specified: Secondary | ICD-10-CM | POA: Diagnosis not present

## 2020-08-22 DIAGNOSIS — H538 Other visual disturbances: Secondary | ICD-10-CM | POA: Diagnosis not present

## 2020-08-28 DIAGNOSIS — J4520 Mild intermittent asthma, uncomplicated: Secondary | ICD-10-CM | POA: Diagnosis not present

## 2020-09-05 DIAGNOSIS — N390 Urinary tract infection, site not specified: Secondary | ICD-10-CM | POA: Diagnosis not present

## 2020-09-05 DIAGNOSIS — D500 Iron deficiency anemia secondary to blood loss (chronic): Secondary | ICD-10-CM | POA: Diagnosis not present

## 2020-09-05 DIAGNOSIS — A499 Bacterial infection, unspecified: Secondary | ICD-10-CM | POA: Diagnosis not present

## 2020-09-09 ENCOUNTER — Telehealth: Payer: Self-pay | Admitting: Allergy

## 2020-09-09 MED ORDER — PREDNISONE 10 MG PO TABS: ORAL_TABLET | ORAL | 0 refills | Status: DC

## 2020-09-09 MED ORDER — ALBUTEROL SULFATE (2.5 MG/3ML) 0.083% IN NEBU: 2.5000 mg | INHALATION_SOLUTION | RESPIRATORY_TRACT | 0 refills | Status: DC | PRN

## 2020-09-09 NOTE — Telephone Encounter (Signed)
Mother said they will call on Monday.   Mother called stating that the nurses that take care of her got covid-19.  She tested for covid-19 last week and she had negative testing. However, I'm not sure if it was done properly as she states that nothing can go too back in the nose as it causes nosebleeds.  She doesn't want to take her to get covid-19 testing anywhere but at our office.  She is antibiotics for UTI currently.  Patient has been wheezing which is not improving.  No fevers/chills.   Currently using Pulmicort BID and albuterol 4 times a day x 3 days.  She needs refills on albuterol. Sent in prednisone.  Advised mother that if she gets acutely worse over the weekend to take her to the [facility] for further evaluation.

## 2020-09-10 ENCOUNTER — Emergency Department (HOSPITAL_COMMUNITY): Payer: Medicare Other

## 2020-09-10 ENCOUNTER — Other Ambulatory Visit: Payer: Self-pay

## 2020-09-10 ENCOUNTER — Encounter (HOSPITAL_COMMUNITY): Payer: Self-pay

## 2020-09-10 ENCOUNTER — Emergency Department (HOSPITAL_COMMUNITY)
Admission: EM | Admit: 2020-09-10 | Discharge: 2020-09-10 | Disposition: A | Discharge status: Home / Self Care | Payer: Medicare Other | Attending: Emergency Medicine | Admitting: Emergency Medicine

## 2020-09-10 DIAGNOSIS — Z7951 Long term (current) use of inhaled steroids: Secondary | ICD-10-CM | POA: Diagnosis not present

## 2020-09-10 DIAGNOSIS — U071 COVID-19: Secondary | ICD-10-CM

## 2020-09-10 DIAGNOSIS — Z8616 Personal history of COVID-19: Secondary | ICD-10-CM | POA: Diagnosis not present

## 2020-09-10 DIAGNOSIS — L539 Erythematous condition, unspecified: Secondary | ICD-10-CM | POA: Insufficient documentation

## 2020-09-10 DIAGNOSIS — J449 Chronic obstructive pulmonary disease, unspecified: Secondary | ICD-10-CM | POA: Diagnosis not present

## 2020-09-10 DIAGNOSIS — J45909 Unspecified asthma, uncomplicated: Secondary | ICD-10-CM | POA: Diagnosis not present

## 2020-09-10 DIAGNOSIS — R062 Wheezing: Secondary | ICD-10-CM | POA: Diagnosis not present

## 2020-09-10 DIAGNOSIS — F840 Autistic disorder: Secondary | ICD-10-CM | POA: Diagnosis not present

## 2020-09-10 DIAGNOSIS — R059 Cough, unspecified: Secondary | ICD-10-CM | POA: Diagnosis not present

## 2020-09-10 DIAGNOSIS — J3489 Other specified disorders of nose and nasal sinuses: Secondary | ICD-10-CM | POA: Insufficient documentation

## 2020-09-10 LAB — RESP PANEL BY RT-PCR (FLU A&B, COVID) ARPGX2
Influenza A by PCR: NEGATIVE
Influenza B by PCR: NEGATIVE
SARS Coronavirus 2 by RT PCR: POSITIVE — AB

## 2020-09-10 LAB — GROUP A STREP BY PCR: Group A Strep by PCR: NOT DETECTED

## 2020-09-10 MED ORDER — IPRATROPIUM-ALBUTEROL 0.5-2.5 (3) MG/3ML IN SOLN: 3.0000 mL | RESPIRATORY_TRACT | 0 refills | Status: DC | PRN

## 2020-09-10 MED ORDER — ONDANSETRON 4 MG PO TBDP: 8.0000 mg | ORAL_TABLET | Freq: Three times a day (TID) | ORAL | 0 refills | Status: AC | PRN

## 2020-09-10 MED ORDER — BENZONATATE 100 MG PO CAPS: 100.0000 mg | ORAL_CAPSULE | Freq: Three times a day (TID) | ORAL | 0 refills | Status: DC

## 2020-09-10 MED ORDER — NIRMATRELVIR/RITONAVIR (PAXLOVID)TABLET: 3.0000 | ORAL_TABLET | Freq: Two times a day (BID) | ORAL | 0 refills | Status: AC

## 2020-09-10 MED ORDER — FLUTICASONE PROPIONATE 50 MCG/ACT NA SUSP: 2.0000 | Freq: Every day | NASAL | 0 refills | Status: DC

## 2020-09-10 MED ORDER — ONDANSETRON 4 MG PO TBDP
4.0000 mg | ORAL_TABLET | Freq: Once | ORAL | Status: AC
  Administered 2020-09-10: 4 mg via ORAL
  Filled 2020-09-10: qty 1

## 2020-09-10 NOTE — Telephone Encounter (Signed)
Patient's mother called back on Sunday morning stating she took her to the ER and diagnosed with Covid. See ER note.  Advised mom to continue meds as per their instructions.

## 2020-09-10 NOTE — ED Notes (Signed)
Discharge papers given to patient mother by PA.

## 2020-09-10 NOTE — ED Provider Notes (Signed)
[facility] EMERGENCY DEPARTMENT Provider Note   CSN: [id number] Arrival date & time: 09/10/20  0706    History Cough   Kerline J Heeg is a 25 y.o. female with past medical history significant for epilepsy, autism, asthma, cognitive delay, sinus tachycardia, recurrent UTI on chronic suppression who presents for evaluation of cough and sore throat.  Began 2 days ago.  Patient's caregivers tested positive for COVID last week.  She did not at home test yesterday which was negative.  She has had some congestion, rhinorrhea, nonproductive cough.  Mother states she feels like she has been wheezing more than normal.  Has been using her home albuterol as well as Pulmicort without relief.  Had contacted her asthma specialist yesterday who called her in refill of her albuterol as well as prednisone.  She took a dose yesterday morning as well as this morning.  Patient is not currently wheezing.  She denies any chest pain, shortness of breath, fever, abdominal pain, emesis.  Denies additional aggravating or alleviating factors.  History obtained from mother, patient past medical records.  No interpreter used.   Level 5 Caveat- Autism, Cognitive delay  HPI     Past Medical History:  Diagnosis Date   Asthma    Autism    mom states no actual dx, but admits she has some symptoms   Bacteriuria    Chronic constipation    Cognitive developmental delay    Constipation    COVID-19 03/2020   Disruptive behavior disorder    Dyspnea    if she walks much   Family history of adverse reaction to anesthesia     "hives"-? if anesthesia did take antibiotic   Frequency-urgency syndrome    Gait disorder    ORGANIC PER NERUOLGIST NOTE (DR HICKLING)   Generalized convulsive epilepsy (HCC) NEUROLOGIST-  DR HICKLING   GERD (gastroesophageal reflux disease)    Gout    H/O sinus tachycardia    History of acute respiratory failure 02/14/2015   SECONDARY TO CAP   History of recurrent UTIs     Incontinent of feces    Interstitial cystitis    Intertrigo 11/29/2019   Moderate intellectual disability    OSA (obstructive sleep apnea)    no machine yet    Partial epilepsy with impairment of consciousness (HCC)    FOLLOWED BY DR HICKLING   Pneumonia 2    4 times this year ( 2020)   PONV (postoperative nausea and vomiting)    Renal cyst 11/29/2019   Seizure (HCC)    most recent sz " at the end of summer"-last sz years ago per mother   Urinary incontinence     Patient Active Problem List   Diagnosis Date Noted   Renal cyst 11/29/2019   Intertrigo 11/29/2019   Complex care coordination 05/18/2019   Screening for cervical cancer 02/26/2019   Glucosuria 06/01/2018   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   Not well controlled moderate persistent asthma 04/29/2018   Gastroesophageal reflux disease 04/29/2018   Recurrent infections 04/29/2018   Dysuria 02/20/2018   Chronic rhinitis 04/03/2017   Recurrent falls 03/21/2017   Weakness 03/21/2017   Venous insufficiency 03/21/2017   OSA on CPAP 09/30/2016   Excessive daytime sleepiness 03/05/2016   Insomnia 10/04/2015   Bronchitis 05/05/2015   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   Cough variant asthma vs UACS/ vcd  03/15/2015   Pneumonia 02/14/2015   Autism spectrum disorder 02/14/2015   Nausea with vomiting 02/14/2015   Obesity,  morbid (HCC) 12/13/2014   Mental retardation, 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   Chronic constipation 08/01/2011   Contraception 08/01/2011    Past Surgical History:  Procedure Laterality Date    CYSTOSCOPY N/A 06/03/2017   Procedure: CYSTOSCOPY WITH EXAM UNDER ANESTHESIA;  Surgeon: Jerilee FieldEskridge, [name], MD;  [facility];  Service: Urology;  Laterality: N/A;   CYSTOSCOPY  2020   CYSTOSCOPY WITH HYDRODISTENSION AND BIOPSY  04/14/2012   Procedure: CYSTOSCOPY/BIOPSY/HYDRODISTENSION;  Surgeon: Lindaann SloughMarc-Henry Nesi, MD;  Location: [facility];  Service: Urology;;  instillation of marcaine and pyridium   EUA/  PAP SMEAR/  BREAST EXAM  03-12-2016   [facility]   EUA/ VAGINOSCOPY/ COLPOSCOPY FO THE HYMENAL RING  10-04-2009    dr Tam[facility]-moore  [facility]   IR RADIOLOGIST EVAL & MGMT  07/29/2019   IR RADIOLOGIST EVAL & MGMT  09/30/2019   IR SCLEROTHERAPY OF A FLUID COLLECTION  09/08/2019   RADIOLOGY WITH ANESTHESIA N/A 09/08/2019   Procedure: IR Sclerotherapy Treatment;  Surgeon: Radiologist, Medication, MD;  Location: [facility] OR;  Service: Radiology;  Laterality: N/A;   TONSILLECTOMY       OB History   No obstetric history on file.     Family History  Problem Relation Age of Onset   Seizures Mother    Seizures Sister        1 sister has   Pancreatic cancer Sister    Colon cancer Neg Hx    Colon polyps Neg Hx    Esophageal cancer Neg Hx    Rectal cancer Neg Hx    Stomach cancer Neg Hx     Social History   Tobacco Use   Smoking status: Never   Smokeless tobacco: Never  Vaping Use   Vaping Use: Never used  Substance Use Topics   Alcohol use: No    Alcohol/week: 0.0 standard drinks   Drug use: No    Home Medications Prior to Admission medications   Medication Sig Start Date End Date Taking? Authorizing Provider  benzonatate (TESSALON) 100 MG capsule Take 1 capsule (100 mg total) by mouth every 8 (eight) hours. 09/10/20  Yes Dynver Clemson A, PA-C  fluticasone (FLONASE) 50 MCG/ACT nasal spray Place 2 sprays into both nostrils daily. 09/10/20  Yes Colleen Kotlarz A, PA-C  ipratropium-albuterol (DUONEB) 0.5-2.5 (3) MG/3ML SOLN Take 3 mLs by  nebulization every 4 (four) hours as needed. 09/10/20  Yes Sheli Dorin A, PA-C  nirmatrelvir/ritonavir EUA (PAXLOVID) TABS Take 3 tablets by mouth 2 (two) times daily for 5 days. Take nirmatrelvir (150 mg) two tablets twice daily for 5 days and ritonavir (100 mg) one tablet twice daily for 5 days. 09/10/20 09/15/20 Yes Marlys Stegmaier A, PA-C  ondansetron (ZOFRAN ODT) 4 MG disintegrating tablet Take 2 tablets (8 mg total) by mouth every 8 (eight) hours as needed for nausea or vomiting. 09/10/20  Yes Citlalic Norlander A, PA-C  acetaminophen (TYLENOL) 325 MG tablet Take 2 tablets (650 mg total) by mouth every 6 (six) hours as needed for mild pain or moderate pain. 02/27/17   Everlene Farrieransie, William, PA-C  albuterol (PROVENTIL) (2.5 MG/3ML) 0.083% nebulizer solution Take  3 mLs (2.5 mg total) by nebulization every 4 (four) hours as needed for wheezing or shortness of breath. 09/09/20   Ellamae Sia, DO  albuterol (VENTOLIN HFA) 108 (90 Base) MCG/ACT inhaler Inhale 2 puffs into the lungs every 6 (six) hours as needed for wheezing or shortness of breath. 05/09/20   Kozlow, Alvira Philips, MD  budesonide (PULMICORT) 1 MG/2ML nebulizer solution USE 1 VIAL VIA NEBULIZER FOUR TIMES DAILY DURING RESPITORY INFECTIONS AND ASTHMA FLARES FOR 1-2 WEEKS 07/21/20   Kozlow, Alvira Philips, MD  cetirizine (ZYRTEC) 10 MG tablet TAKE 1 TABLET(10 MG) BY MOUTH DAILY 05/09/20   Kozlow, Alvira Philips, MD  clotrimazole-betamethasone (LOTRISONE) cream Apply to affected area 2 times daily prn 06/29/18   Elvina Sidle, MD  DEPAKOTE 500 MG DR tablet TAKE 1 TABLET BY MOUTH IN THE MORNING AND 2 TABLETS AT NIGHT 07/04/20   Elveria Rising, NP  famotidine (PEPCID) 20 MG tablet TAKE 1 TABLET(20 MG) BY MOUTH EVERY 12 HOURS 05/09/20   Kozlow, Alvira Philips, MD  fluconazole (DIFLUCAN) 150 MG tablet Take 150 mg by mouth as needed (when on antibiotics).    [provider]  formoterol (PERFOROMIST) 20 MCG/2ML nebulizer solution USE 1 VIAL VIA NEBULIZER TWICE DAILY 05/09/20    Kozlow, Alvira Philips, MD  hydrOXYzine (ATARAX/VISTARIL) 25 MG tablet Take 25 mg by mouth 3 (three) times daily as needed (pain).  01/13/19   [provider]  Levonorgestrel-Ethinyl Estradiol (SEASONIQUE) 0.15-0.03 &0.01 MG tablet Take 1 tablet by mouth daily. 03/21/20   Sharyon Cable, CNM  melatonin 3 MG TABS tablet Take 2 tablets (6mg ) by mouth at bedtime. 01/28/20   13/12/21, NP  mometasone (NASONEX) 50 MCG/ACT nasal spray Use one spray in each nostril once daily as directed. 05/09/20   Kozlow, 05/11/20, MD  montelukast (SINGULAIR) 10 MG tablet TAKE 1 TABLET(10 MG) BY MOUTH AT BEDTIME 05/09/20   Kozlow, 05/11/20, MD  mupirocin nasal ointment (BACTROBAN NASAL) 2 % Place 1 application into the nose 3 (three) times daily. Use  in each nostril twice daily for twenty one (21) days. 02/08/20   Kozlow, 02/10/20, MD  Nebulizer MISC Adult mask with tubing. Use as directed with nebulizer device. 11/18/18   Kozlow, 01/18/19, MD  nystatin (MYCOSTATIN) 100000 UNITS/ML SUSP swish and swallow 3 times a day for 10 days. 03/16/20   Kozlow, 03/18/20, MD  nystatin (MYCOSTATIN/NYSTOP) powder Apply 1 application topically 3 (three) times daily. 03/01/20   Vu, 03/03/20 T, MD  polyethylene glycol powder (GLYCOLAX/MIRALAX) 17 GM/SCOOP powder DISSOLVE 1 CAPFULL IN AT LEAST 8 OZ WATER/JUICE TWICE DAILY. 07/04/20   Pyrtle, 07/06/20, MD  predniSONE (DELTASONE) 10 MG tablet Take prednisone 40mg  daily x 2 days, 30mg  daily x 2 days, 20mg  daily x 2 days and 10mg  daily x 2 days. 09/09/20   , DO  promethazine (PHENERGAN) 25 MG tablet Take 1 tablet (25 mg total) by mouth every 6 (six) hours as needed for nausea or vomiting. 04/28/19   , DO  senna (SENOKOT) 8.6 MG tablet Take 1 tablet by mouth at bedtime.     [provider]  terconazole (TERAZOL 7) 0.4 % vaginal cream Place 1 applicator vaginally at bedtime. Patient taking differently: Place 1 applicator vaginally at bedtime as needed (yeast). 01/28/19   09/11/20, CNM  traZODone (DESYREL) 50 MG tablet TAKE 2 TABLETS(100 MG) BY MOUTH AT BEDTIME 07/08/20   06/26/19, NP    Allergies  Abilify [aripiprazole], Seroquel [quetiapine fumarate], Amoxicillin-pot clavulanate, Bactrim [sulfamethoxazole-trimethoprim], Doxycycline, Macrobid [nitrofurantoin macrocrystal], and Keppra [levetiracetam]  Review of Systems   Review of Systems  Unable to perform ROS: Other (Autisn, cognitive delay)  Constitutional: Negative.   HENT:  Positive for congestion, postnasal drip, rhinorrhea and sore throat. Negative for drooling, trouble swallowing and voice change.   Respiratory:  Positive for cough and wheezing. Negative for apnea, choking, chest tightness, shortness of breath and stridor.   Cardiovascular: Negative.   Gastrointestinal: Negative.   Genitourinary: Negative.   Musculoskeletal: Negative.   Skin: Negative.   Neurological: Negative.   All other systems reviewed and are negative.  Physical Exam Updated Vital Signs BP 109/71 (BP Location: Right Arm)   Pulse (!) 58   Temp 98.7 F (37.1 C) (Oral)   Resp 15   SpO2 100%   Physical Exam Vitals and nursing note reviewed.  Constitutional:      General: She is not in acute distress.    Appearance: She is well-developed. She is not ill-appearing, toxic-appearing or diaphoretic.  HENT:     Head: Normocephalic and atraumatic.     Jaw: There is normal jaw occlusion.     Nose: Congestion and rhinorrhea present.     Right Turbinates: Swollen.     Left Turbinates: Swollen.     Comments: Clear rhinorrhea and congestion to bilateral nares.  No sinus tenderness.    Mouth/Throat:     Lips: Pink.     Mouth: Mucous membranes are moist.     Pharynx: Uvula midline. Posterior oropharyngeal erythema present. No pharyngeal swelling, oropharyngeal exudate or uvula swelling.     Tonsils: No tonsillar exudate or tonsillar abscesses.     Comments: Posterior oropharynx erythematous.  Mucous membranes  moist.  Tonsils with erythema without exudate. Uvula midline without deviation.  No evidence of PTA or RPA.  No drooling, dysphasia or trismus.  Phonation normal. Eyes:     Pupils: Pupils are equal, round, and reactive to light.  Neck:     Trachea: Trachea and phonation normal.     Comments: No Neck stiffness or neck rigidity.   Cardiovascular:     Rate and Rhythm: Normal rate.     Comments: No murmurs rubs or gallops. Pulmonary:     Effort: No respiratory distress.     Comments: Clear to auscultation bilaterally without wheeze, rhonchi or rales.  No accessory muscle usage.  Able speak in full sentences. Abdominal:     General: There is no distension.     Comments: Soft, nontender without rebound or guarding.  No CVA tenderness.  Musculoskeletal:        General: Normal range of motion.     Cervical back: Full passive range of motion without pain and normal range of motion.     Comments: Moves all 4 extremities without difficulty.  Lower extremities without edema, erythema or warmth.  Skin:    General: Skin is warm and dry.     Comments: Brisk capillary refill.  No rashes or lesions.  Neurological:     General: No focal deficit present.     Mental Status: She is alert.     Comments: Ambulatory in department without difficulty.  Cranial nerves II through XII grossly intact.  No facial droop.  No aphasia.  Psychiatric:        Mood and Affect: Mood normal.    ED Results / Procedures / Treatments   Labs (all labs ordered are listed, but only abnormal results  are displayed) Labs Reviewed  RESP PANEL BY RT-PCR (FLU A&B, COVID) [id number] - Abnormal; Notable for the following components:      Result Value   SARS Coronavirus 2 by RT PCR POSITIVE (*)    All other components within normal limits  GROUP A STREP BY PCR    EKG None  Radiology DG Chest 2 View  Result Date: 09/10/2020 CLINICAL DATA:  Cough and wheezing. EXAM: CHEST - 2 VIEW COMPARISON:  03/26/2020 FINDINGS: Normal heart,  mediastinum and hila. Clear lungs.  No pleural effusion or pneumothorax. Skeletal structures are within normal limits. IMPRESSION: Normal chest radiographs. Electronically Signed   By: Amie Portland M.D.   On: 09/10/2020 08:18    Procedures Procedures   Medications Ordered in ED Medications  ondansetron (ZOFRAN-ODT) disintegrating tablet 4 mg (4 mg Oral Given 09/10/20 1052)    ED Course  I have reviewed the triage vital signs and the nursing notes.  Pertinent labs & imaging results that were available during my care of the patient were reviewed by me and considered in my medical decision making (see chart for details).  Here for evaluation of upper respiratory complaints.  She is afebrile, nonseptic, not ill-appearing.  Heart and lungs clear.  Abdomen soft, nontender.  Does have boggy nasal turbinates with clear rhinorrhea.  Posterior pharynx erythematous however no exudate, edema.  No evidence of PTA or RPA.  No clinical evidence of DVT on exam.  No tachycardia.  Recently started on prednisone yesterday by asthma specialist.  Did have recent closure to COVID and caregivers  X-ray obtained from triage today personally viewed interpreted symmetry evidence of cardiomegaly, pulmonary edema, infiltrates  COVID POSITIVE Strep neagtuve  Patient reassessed. No hypoxia or acute respiratory distress.  Patient reassessed. Discussed positive COVID test with mother.  Patient had episode of emesis in room.  Will give ODT Zofran and reassess  Patient given ODT Zofran.  Has tolerated p.o. challenge afterwards.  Given symptomatic management at home.  Discussed risk versus benefit of Paxlovid treatment.  Mother voiced understanding and would like to try the Paxlovid treatment.  Rx given.  Discussed medications that cannot be taken with this. She will FU with PCP and return for new or worsening symptoms  The patient has been appropriately medically screened and/or stabilized in the ED. I have low suspicion  for any other emergent medical condition which would require further screening, evaluation or treatment in the ED or require inpatient management.  Patient is hemodynamically stable and in no acute distress.  Patient able to ambulate in department prior to ED.  Evaluation does not show acute pathology that would require ongoing or additional emergent interventions while in the emergency department or further inpatient treatment.  I have discussed the diagnosis with the patient and answered all questions.  Pain is been managed while in the emergency department and patient has no further complaints prior to discharge.  Patient is comfortable with plan discussed in room and is stable for discharge at this time.  I have discussed strict return precautions for returning to the emergency department.  Patient was encouraged to follow-up with PCP/specialist refer to at discharge.     MDM Rules/Calculators/A&P                           Final Clinical Impression(s) / ED Diagnoses Final diagnoses:  COVID    Rx / DC Orders ED Discharge Orders  Ordered    ipratropium-albuterol (DUONEB) 0.5-2.5 (3) MG/3ML SOLN  Every 4 hours PRN        09/10/20 1108    ondansetron (ZOFRAN ODT) 4 MG disintegrating tablet  Every 8 hours PRN        09/10/20 1108    nirmatrelvir/ritonavir EUA (PAXLOVID) TABS  2 times daily        09/10/20 1108    benzonatate (TESSALON) 100 MG capsule  Every 8 hours        09/10/20 1112    fluticasone (FLONASE) 50 MCG/ACT nasal spray  Daily        09/10/20 1112             Heddy Vidana A, PA-C 09/10/20 1117    Sabas Sous, MD 09/10/20 1519

## 2020-09-10 NOTE — ED Triage Notes (Signed)
Patient mother report that patient has had congestion and wheezing with sore throat x 2 days. Unsure if exposed to covid. Patient alert and to baseline. NAD

## 2020-09-10 NOTE — ED Notes (Signed)
Pt able to tolerate PO intake at this time. PA made aware.

## 2020-09-10 NOTE — Discharge Instructions (Addendum)
COVID test is positive  Take the medications as prescribed  Return for new or worsening symptoms

## 2020-09-13 ENCOUNTER — Telehealth: Payer: Self-pay | Admitting: Pulmonary Disease

## 2020-09-13 NOTE — Telephone Encounter (Signed)
Pts mom informed of this and will call us next week to get pt on schedule for a follow up

## 2020-09-13 NOTE — Telephone Encounter (Signed)
I called the patient and her mother wanted Dr. Lucie Leather to know for her asthma the ipratropium-albuterol Duo nebulizer treatment has been helping with the wheezing. The Covid test came back positive mom states she does not really have COVID. They did an x-ray and strep test and both came back normal at the hospital. She stated that she has no plans to retest for COVID. She was also told that she did not really have to give her the antibiotic medication for Covid since her symptoms are mainly wheezing due to asthma. Mom just called backed and stated the patient does not like being without her mask on. I told her since she is in a room by herself its ok for her to take off the mask. The mom said that she sprays and cleans after her daily. She later on stated she does give the COVID antibiotic to Chaylee and has 2 days left of the medication. Please advise when you would like to see her back in office per patient request for next steps in asthma care plan.

## 2020-09-13 NOTE — Telephone Encounter (Signed)
Please inform Lori Miles's mom that she is welcome to come to the clinic should there be a problem as she moves forward with this recent infection.

## 2020-09-13 NOTE — Telephone Encounter (Signed)
Spoke with patient's mother. Stated that she wanted you to know that patient has COVID again. She is fine. Having some wheezing. She was put on Duoneb, Paxlovid and antibiotic for UTI in the ED.    Dr. Armandina Gemma

## 2020-09-13 NOTE — Telephone Encounter (Signed)
Patient mom called and said that a nurse would call her yesterday to check up on Lori Miles. And no one called. [phone].

## 2020-09-13 NOTE — Telephone Encounter (Signed)
Noted  

## 2020-09-14 ENCOUNTER — Other Ambulatory Visit: Payer: Self-pay | Admitting: *Deleted

## 2020-09-14 MED ORDER — PREDNISONE 5 MG PO TABS: 5.0000 mg | ORAL_TABLET | Freq: Every day | ORAL | 0 refills | Status: AC

## 2020-09-14 NOTE — Telephone Encounter (Signed)
Please advise to another prednisone taper to get her thru her current symptoms with covid

## 2020-09-14 NOTE — Telephone Encounter (Signed)
Please provide prednisone 10 mg tablet-1/2 tablet 1 time per day for 10 days only

## 2020-09-14 NOTE — Telephone Encounter (Signed)
Patient mom called and said that she needs a little bit more of the prednisone. It is helping but she is still congested. [facility] [address] [phone].

## 2020-09-14 NOTE — Telephone Encounter (Signed)
Prescription has been sent in. Called patients mother and advised. Patients mother verbalized understanding.  ?

## 2020-09-15 DIAGNOSIS — J4520 Mild intermittent asthma, uncomplicated: Secondary | ICD-10-CM | POA: Diagnosis not present

## 2020-09-20 ENCOUNTER — Telehealth: Payer: Self-pay | Admitting: Allergy and Immunology

## 2020-09-20 NOTE — Telephone Encounter (Signed)
[name] called requesting a call back from a nurse.   [phone]

## 2020-09-20 NOTE — Telephone Encounter (Signed)
Called and spoke with the patient's mother and she stated that Lori Miles is doing much bette. She stated that she has finished the prednisone and has stopped using the DuoNeb's for now and will resume if she is sick again. Right now she is going to resume regular Albuterol if needed. She was very thankful for the help she has received.

## 2020-09-21 ENCOUNTER — Other Ambulatory Visit: Payer: Self-pay | Admitting: Internal Medicine

## 2020-09-26 DIAGNOSIS — J4520 Mild intermittent asthma, uncomplicated: Secondary | ICD-10-CM | POA: Diagnosis not present

## 2020-10-03 DIAGNOSIS — N390 Urinary tract infection, site not specified: Secondary | ICD-10-CM | POA: Diagnosis not present

## 2020-10-12 ENCOUNTER — Telehealth: Payer: Self-pay | Admitting: Allergy and Immunology

## 2020-10-12 NOTE — Telephone Encounter (Signed)
Please inform Lori Miles's mom that we are not going to provide her any medications over the telephone.  She is too complex for that type of medical care.  She needs to be seen in the clinic.  Hopefully there is an opening today with someone in the [facility].

## 2020-10-12 NOTE — Telephone Encounter (Signed)
Patient mom called and said that she started wheezing yesterday and it is worse this morning and said her throat hurts. Needs the nose ointment you  gave  her before.. She said that she may need predisone too. [facility] [address] [phone]

## 2020-10-13 ENCOUNTER — Other Ambulatory Visit: Payer: Self-pay

## 2020-10-13 ENCOUNTER — Encounter: Payer: Self-pay | Admitting: Allergy

## 2020-10-13 ENCOUNTER — Ambulatory Visit (INDEPENDENT_AMBULATORY_CARE_PROVIDER_SITE_OTHER): Payer: Medicare Other | Admitting: Allergy

## 2020-10-13 ENCOUNTER — Telehealth: Payer: Self-pay | Admitting: Allergy

## 2020-10-13 VITALS — BP 100/68 | HR 108 | Resp 16

## 2020-10-13 DIAGNOSIS — J3089 Other allergic rhinitis: Secondary | ICD-10-CM

## 2020-10-13 DIAGNOSIS — J4541 Moderate persistent asthma with (acute) exacerbation: Secondary | ICD-10-CM | POA: Diagnosis not present

## 2020-10-13 DIAGNOSIS — J343 Hypertrophy of nasal turbinates: Secondary | ICD-10-CM | POA: Diagnosis not present

## 2020-10-13 DIAGNOSIS — K219 Gastro-esophageal reflux disease without esophagitis: Secondary | ICD-10-CM

## 2020-10-13 MED ORDER — OLOPATADINE HCL 0.2 % OP SOLN: OPHTHALMIC | 5 refills | Status: DC

## 2020-10-13 MED ORDER — PREDNISONE 10 MG PO TABS: ORAL_TABLET | ORAL | 0 refills | Status: DC

## 2020-10-13 NOTE — Telephone Encounter (Signed)
Patients mom is requesting to speak with a nurse. She is wanting an antibiotic to be sent in for patient. She states patient is experiencing the same symptoms as she does when sick. She's afraid patient will develop pneumonia if not put on antibiotics.

## 2020-10-13 NOTE — Progress Notes (Signed)
Follow-up Note  RE: Lori Miles MRN: [id number] DOB: [birth year] Date of Office Visit: 10/13/2020   History of present illness: Lori Miles is a 25 y.o. female presenting today for sick visit for wheezing.  She presents today with her mother.  She was last seen in the office on 05/09/2020 by Dr. Lucie Leather.   She is currently on Keflex for a UTI.  Mother states her asthma seems to flare when she has a UTI.  She has been on keflex and has about 2-3 more days left.  Mother states she has been wheezing for past several days now.  Mother gave her a albuterol treatment (duoneb) around 3-4 am this morning and has been needing to perform treatments every 4-6 hours at home.  Mother also states she is having nasal congestion.  Mother states her "nose is swollen".  She has difficulty getting Lori Miles to tolerate use of the nasal steroid spray.  Mother states they have used both flonase and nasonex.  She does however like the feeling of the nasal gel [place] thus they apply this during the day.  Mother did increase the pulmicort neb to 3-4 times a day currently with her symptoms.  Doing perforomist neb twice a day.  She takes singulair and zyrtec daily.   Mother states when this occurs with her UTIs she does respond well to systemic steroid.    Review of systems: Review of Systems  Constitutional: Negative.   HENT:  Positive for congestion.   Eyes: Negative.   Respiratory:  Positive for wheezing.   Cardiovascular: Negative.   Gastrointestinal: Negative.   Genitourinary:  Positive for dysuria.  Musculoskeletal: Negative.   Skin: Negative.   Neurological: Negative.    All other systems negative unless noted above in HPI  Past medical/social/surgical/family history have been reviewed and are unchanged unless specifically indicated below.  No changes  Medication List: Current Outpatient Medications  Medication Sig Dispense Refill   acetaminophen (TYLENOL) 325 MG tablet Take 2 tablets (650 mg  total) by mouth every 6 (six) hours as needed for mild pain or moderate pain. 60 tablet 0   albuterol (PROVENTIL) (2.5 MG/3ML) 0.083% nebulizer solution Take 3 mLs (2.5 mg total) by nebulization every 4 (four) hours as needed for wheezing or shortness of breath. 150 mL 0   albuterol (VENTOLIN HFA) 108 (90 Base) MCG/ACT inhaler Inhale 2 puffs into the lungs every 6 (six) hours as needed for wheezing or shortness of breath. 18 g 1   benzonatate (TESSALON) 100 MG capsule Take 1 capsule (100 mg total) by mouth every 8 (eight) hours. 21 capsule 0   budesonide (PULMICORT) 1 MG/2ML nebulizer solution USE 1 VIAL VIA NEBULIZER FOUR TIMES DAILY DURING RESPITORY INFECTIONS AND ASTHMA FLARES FOR 1-2 WEEKS 240 mL 3   cetirizine (ZYRTEC) 10 MG tablet TAKE 1 TABLET(10 MG) BY MOUTH DAILY 30 tablet 5   DEPAKOTE 500 MG DR tablet TAKE 1 TABLET BY MOUTH IN THE MORNING AND 2 TABLETS AT NIGHT 90 tablet 5   famotidine (PEPCID) 20 MG tablet TAKE 1 TABLET(20 MG) BY MOUTH EVERY 12 HOURS 180 tablet 3   fluconazole (DIFLUCAN) 150 MG tablet Take 150 mg by mouth as needed (when on antibiotics).     fluticasone (FLONASE) 50 MCG/ACT nasal spray Place 2 sprays into both nostrils daily. 11.1 mL 0   formoterol (PERFOROMIST) 20 MCG/2ML nebulizer solution USE 1 VIAL VIA NEBULIZER TWICE DAILY 120 mL 5   hydrOXYzine (ATARAX/VISTARIL) 25 MG tablet Take  25 mg by mouth 3 (three) times daily as needed (pain).      ipratropium-albuterol (DUONEB) 0.5-2.5 (3) MG/3ML SOLN Take 3 mLs by nebulization every 4 (four) hours as needed. 360 mL 0   Levonorgestrel-Ethinyl Estradiol (SEASONIQUE) 0.15-0.03 &0.01 MG tablet Take 1 tablet by mouth daily. 28 tablet 3   melatonin 3 MG TABS tablet Take 2 tablets (6mg ) by mouth at bedtime. 60 tablet 0   montelukast (SINGULAIR) 10 MG tablet TAKE 1 TABLET(10 MG) BY MOUTH AT BEDTIME 30 tablet 5   nystatin (MYCOSTATIN/NYSTOP) powder Apply 1 application topically 3 (three) times daily. 15 g 6   Olopatadine HCl 0.2 %  SOLN Can use one drop in each eye once daily if needed for red, itching, watery eyes. 2.5 mL 5   ondansetron (ZOFRAN ODT) 4 MG disintegrating tablet Take 2 tablets (8 mg total) by mouth every 8 (eight) hours as needed for nausea or vomiting. 20 tablet 0   polyethylene glycol powder (GLYCOLAX/MIRALAX) 17 GM/SCOOP powder DISSOLVE 1 CAPFUL(17 GRAMS) IN AT LEAST 8 OUNCES OF WATER/JUICE TWICE DAILY 1020 g 2   predniSONE (DELTASONE) 10 MG tablet Take prednisone 40mg  daily x 2 days, 30mg  daily x 2 days, 20mg  daily x 2 days and 10mg  daily x 2 days. 20 tablet 0   promethazine (PHENERGAN) 25 MG tablet Take 1 tablet (25 mg total) by mouth every 6 (six) hours as needed for nausea or vomiting. 30 tablet 0   senna (SENOKOT) 8.6 MG tablet Take 1 tablet by mouth at bedtime.      traZODone (DESYREL) 50 MG tablet TAKE 2 TABLETS(100 MG) BY MOUTH AT BEDTIME 62 tablet 5   cephALEXin (KEFLEX) 500 MG capsule Take 500 mg by mouth 3 (three) times daily.     Cholecalciferol (VITAMIN D3) 1.25 MG (50000 UT) CAPS Take 1 capsule by mouth once a week.     clotrimazole-betamethasone (LOTRISONE) cream Apply to affected area 2 times daily prn (Patient not taking: Reported on 10/13/2020) 45 g 0   FEROSUL 325 (65 Fe) MG tablet Take 325 mg by mouth daily.     mometasone (NASONEX) 50 MCG/ACT nasal spray Use one spray in each nostril once daily as directed. (Patient not taking: Reported on 10/13/2020) 17 g 1   mupirocin nasal ointment (BACTROBAN NASAL) 2 % Place 1 application into the nose 3 (three) times daily. Use  in each nostril twice daily for twenty one (21) days. (Patient not taking: Reported on 10/13/2020) 70 g 0   Nebulizer MISC Adult mask with tubing. Use as directed with nebulizer device. 4 each 12   terconazole (TERAZOL 7) 0.4 % vaginal cream Place 1 applicator vaginally at bedtime. (Patient not taking: Reported on 10/13/2020) 45 g 2   No current facility-administered medications for this visit.     Known medication  allergies: Allergies  Allergen Reactions   Abilify [Aripiprazole] Swelling and Palpitations   Seroquel [Quetiapine Fumarate] Palpitations   Amoxicillin-Pot Clavulanate Hives and Other (See Comments)    Has patient had a PCN reaction causing immediate rash, facial/tongue/throat swelling, SOB or lightheadedness with hypotension: No Has patient had a PCN reaction causing severe rash involving mucus membranes or skin necrosis:    #  #  YES  #  #  Has patient had a PCN reaction that required hospitalization: No Has patient had a PCN reaction occurring within the last 10 years: No    Bactrim [Sulfamethoxazole-Trimethoprim] Other (See Comments)    Abdominal pain   Doxycycline Other (See  Comments)    Stomach cramps   Macrobid [Nitrofurantoin Macrocrystal] Other (See Comments)    Sharp pain/ given with Tylenol 325 mg   Keppra [Levetiracetam] Other (See Comments)    Aggressive behavior      Physical examination: Blood pressure 100/68, pulse (!) 108, resp. rate 16, SpO2 97 %.  General: Alert, interactive, in no acute distress. HEENT: PERRLA, TMs pearly gray, turbinates moderately edematous without discharge, post-pharynx non erythematous. Neck: Supple without lymphadenopathy. Lungs: Mildly decreased breath sounds with expiratory wheezing bilaterally. {no increased work of breathing. CV: Normal S1, S2 without murmurs. Abdomen: Nondistended, nontender. Skin: Warm and dry, without lesions or rashes. Extremities:  No clubbing, cyanosis or edema. Neuro:   Grossly intact.  Diagnositics/Labs:  Spirometry: FEV1: 1.84 L 80%, FVC: 1.86 L 71% predicted.  This FEV1 is normal   Assessment and plan: Moderate persistent asthma with acute exacerbation -Per mother she tends to have an exacerbation when she has a UTI which she currently is being treated for with Keflex.  She has only a couple days left of her antibiotic regimen.  Recommend that she do a prednisone burst at this time to help with her  asthma symptoms decrease albuterol needs. Allergic rhinitis-on exam she does have enlarged turbinates and thus recommended she be more consistent with use of the nasal steroid spray.  Also advised that prednisone will help to improve the enlargement of her turbinates so that she can have better airflow through her nose GERD-continue current regimen   1. Continue a combination of the following:   A. budesonide 1 mg nebulized twice a day  B. Perforomist nebulized twice a day  C. Nasal saline 2 times per day  D. montelukast 10 mg daily  E. cetirizine 10 mg daily  2. Continue to Treat reflux with a combination of the following:   A. Famotidine 40mg  twice a day  B. No caffeine or chocolate consumption  3. "Action plan" for current asthma/sinus flare up:   A. increase budesonide to 4 times a day  B. Nasal steroid spray Flonase or Nasonex 2 sprays each nostril daily  C. Prednisone burst as directed   4. Continue albuterol nebulization every 4-6 hours if needed  5. Return to clinic in 6 months or earlier if problem  I appreciate the opportunity to take part in Charity's care. Please do not hesitate to contact me with questions.  Sincerely,   , MD Allergy/Immunology [facility]

## 2020-10-13 NOTE — Telephone Encounter (Signed)
I spoke to patient's mother and inform her of Dr. Randell Patient message. Patient's mother disagree and would like for me to speak to Dr. Lucie Leather on Monday. She states Dr. Lucie Leather knows her daughter better and she is willing to wait. Patient's mother did say if Saphia gets worse she will take her to urgent care or the emergency room. I will call her back on Monday with Dr. Kathyrn Lass recommendations

## 2020-10-13 NOTE — Patient Instructions (Signed)
  1. Continue a combination of the following:   A. budesonide 1 mg nebulized twice a day  B. Perforomist nebulized twice a day  C. Nasal saline 2 times per day  D. montelukast 10 mg daily  E. cetirizine 10 mg daily  2. Continue to Treat reflux with a combination of the following:   A. Famotidine 40mg  twice a day  B. No caffeine or chocolate consumption  3. "Action plan" for current asthma/sinus flare up:   A. increase budesonide to 4 times a day  B. Nasal steroid spray Flonase or Nasonex 2 sprays each nostril daily  C. Prednisone burst as directed   4. Continue albuterol nebulization every 4-6 hours if needed  5. Return to clinic in 6 months or earlier if problem

## 2020-10-16 ENCOUNTER — Telehealth (INDEPENDENT_AMBULATORY_CARE_PROVIDER_SITE_OTHER): Payer: Self-pay | Admitting: Family

## 2020-10-16 NOTE — Telephone Encounter (Signed)
I spoke to Dr. Lucie Leather and he would like to see patient in office tomorrow. He will not do any treatment over the phone. I called patient's mother but unable to reach and voicemail was full.

## 2020-10-16 NOTE — Telephone Encounter (Signed)
  Who's calling (name and relationship to patient) : Lori Miles, Lori Miles (Mother Best contact number: [phone] (Home) Provider they see:  Elveria Rising, NP Reason for call: Please contact mom in regards to e-mail sent on 7/29.  PRESCRIPTION REFILL ONLY  Name of prescription:  Pharmacy:

## 2020-10-16 NOTE — Telephone Encounter (Signed)
Mom called wondering what could be done since patient still has the same symptoms. I did explain to her Dr. Lucie Leather wanted to see patient tomorrow in office. Mom stated she could only do an 8:30 as she will not have transportation for any time later. I have scheduled patient for tomorrow at 9 am but she will be coming in at 8:30am.

## 2020-10-17 ENCOUNTER — Ambulatory Visit (INDEPENDENT_AMBULATORY_CARE_PROVIDER_SITE_OTHER): Payer: Medicare Other | Admitting: Allergy and Immunology

## 2020-10-17 ENCOUNTER — Other Ambulatory Visit: Payer: Self-pay

## 2020-10-17 VITALS — BP 126/80 | HR 116 | Temp 98.0°F | Resp 16 | Ht <= 58 in | Wt 161.4 lb

## 2020-10-17 DIAGNOSIS — N390 Urinary tract infection, site not specified: Secondary | ICD-10-CM | POA: Diagnosis not present

## 2020-10-17 DIAGNOSIS — K219 Gastro-esophageal reflux disease without esophagitis: Secondary | ICD-10-CM | POA: Diagnosis not present

## 2020-10-17 DIAGNOSIS — J4550 Severe persistent asthma, uncomplicated: Secondary | ICD-10-CM

## 2020-10-17 DIAGNOSIS — J3089 Other allergic rhinitis: Secondary | ICD-10-CM | POA: Diagnosis not present

## 2020-10-17 MED ORDER — OLOPATADINE HCL 0.2 % OP SOLN: OPHTHALMIC | 5 refills | Status: DC

## 2020-10-17 MED ORDER — FAMOTIDINE 20 MG PO TABS: ORAL_TABLET | ORAL | 3 refills | Status: DC

## 2020-10-17 MED ORDER — ALBUTEROL SULFATE (2.5 MG/3ML) 0.083% IN NEBU: 2.5000 mg | INHALATION_SOLUTION | RESPIRATORY_TRACT | 0 refills | Status: DC | PRN

## 2020-10-17 MED ORDER — ALBUTEROL SULFATE HFA 108 (90 BASE) MCG/ACT IN AERS: 2.0000 | INHALATION_SPRAY | Freq: Four times a day (QID) | RESPIRATORY_TRACT | 1 refills | Status: DC | PRN

## 2020-10-17 MED ORDER — BUDESONIDE 1 MG/2ML IN SUSP: RESPIRATORY_TRACT | 3 refills | Status: DC

## 2020-10-17 MED ORDER — FORMOTEROL FUMARATE 20 MCG/2ML IN NEBU: INHALATION_SOLUTION | RESPIRATORY_TRACT | 5 refills | Status: DC

## 2020-10-17 MED ORDER — MONTELUKAST SODIUM 10 MG PO TABS: ORAL_TABLET | ORAL | 5 refills | Status: DC

## 2020-10-17 NOTE — Telephone Encounter (Signed)
Called mom and she informs that she is in an appointment and unable to discuss anything. Let mom know we did not receive an e-mail. Mom states she sent the email to [facility] directly.

## 2020-10-17 NOTE — Telephone Encounter (Signed)
I called and spoke to Mom. She wanted to schedule 6 month revisit for [facility]. I scheduled the appointment for December 26, 2020. TG

## 2020-10-17 NOTE — Patient Instructions (Addendum)
  1. Continue a combination of the following:   A. budesonide 1 mg nebulized twice a day  B. Perforomist nebulized twice a day  C. Nasal saline 2 times per day  D. montelukast 10 mg daily  E. cetirizine 10 mg daily  2. Continue to Treat reflux with a combination of the following:   A. Famotidine 40mg  twice a day  B. No caffeine or chocolate consumption  3. "Action plan" for asthma flare up:   A. increase budesonide to 4 times a day  4. Continue albuterol or DUONEB nebulization every 4-6 hours if needed  5. NO MORE prednisone.   6. NO MORE antibiotics for respiratory tract.  7. Start Tezepelumab injections when approved by insurance  8. Treat persistent urinary tract infection. Why is this persistent?   9. Obtain fall flu vaccine  10. Return to clinic 12 weeks or earlier if problem

## 2020-10-17 NOTE — Progress Notes (Signed)
[name]   Follow-up Note  Referring Provider: Scifres, Nicole Cellaorothy, PA-C Primary Provider: Karlyne GreenspanScifres, [name], PA-C Date of Office Visit: 10/17/2020  Subjective:   Lori HeckJasmine J Miles (DOB: 06/18/1995) is a 25 y.o. female who [facility] in re-evaluation of the following:  HPI: Lori CellaJasmine presents to this clinic in evaluation of asthma and allergic rhinitis and reflux..  I last saw her in this clinic on 09 May 2020 and she did visit with Dr. Delorse LekPadgett on 13 October 2020 with an asthma flareup.  Apparently she has been having wheezing and coughing and she is using her bronchodilator multiple times per day and Dr. Delorse LekPadgett gave her prednisone during her last visit and it did not appear to help very much.  As well, her mom states that she might be having some nasal congestion.  Her reflux appears to be under good control.  She just does not feel well and she has not felt well since her chronic urinary tract infection that apparently has been resistant to therapy administered in the present.  It sounds as though she may have Klebsiella and Proteus infection and she has been given Keflex recently and she had another culture performed and she will be following up with her urologist concerning further management of this issue.  Allergies as of 10/17/2020       Reactions   Abilify [aripiprazole] Swelling, Palpitations   Seroquel [quetiapine Fumarate] Palpitations   Amoxicillin-pot Clavulanate Hives, Other (See Comments)   Has patient had a PCN reaction causing immediate rash, facial/tongue/throat swelling, SOB or lightheadedness with hypotension: No Has patient had a PCN reaction causing severe rash involving mucus membranes or skin necrosis:    #  #  YES  #  #  Has patient had a PCN reaction that required hospitalization: No Has patient had a PCN reaction occurring within the last 10 years: No   Bactrim  [sulfamethoxazole-trimethoprim] Other (See Comments)   Abdominal pain   Doxycycline Other (See Comments)   Stomach cramps   Macrobid [nitrofurantoin Macrocrystal] Other (See Comments)   Sharp pain/ given with Tylenol 325 mg   Keppra [levetiracetam] Other (See Comments)   Aggressive behavior         Medication List    acetaminophen 325 MG tablet Commonly known as: TYLENOL Take 2 tablets (650 mg total) by mouth every 6 (six) hours as needed for mild pain or moderate pain.   albuterol 108 (90 Base) MCG/ACT inhaler Commonly known as: VENTOLIN HFA Inhale 2 puffs into the lungs every 6 (six) hours as needed for wheezing or shortness of breath.   albuterol (2.5 MG/3ML) 0.083% nebulizer solution Commonly known as: PROVENTIL Take 3 mLs (2.5 mg total) by nebulization every 4 (four) hours as needed for wheezing or shortness of breath.   Bactroban Nasal 2 % Generic drug: mupirocin nasal ointment Place 1 application into the nose 3 (three) times daily. Use  in each nostril twice daily for twenty one (21) days.   benzonatate 100 MG capsule Commonly known as: TESSALON Take 1 capsule (100 mg total) by mouth every 8 (eight) hours.   budesonide 1 MG/2ML nebulizer solution Commonly known as: PULMICORT USE 1 VIAL VIA NEBULIZER FOUR TIMES DAILY DURING RESPITORY INFECTIONS AND ASTHMA FLARES FOR 1-2 WEEKS   cephALEXin 500 MG capsule Commonly known as: KEFLEX Take 500 mg by mouth 3 (three) times daily.   cetirizine 10 MG tablet Commonly known  as: ZYRTEC TAKE 1 TABLET(10 MG) BY MOUTH DAILY   clotrimazole-betamethasone cream Commonly known as: LOTRISONE Apply to affected area 2 times daily prn   Depakote 500 MG DR tablet Generic drug: divalproex TAKE 1 TABLET BY MOUTH IN THE MORNING AND 2 TABLETS AT NIGHT   famotidine 20 MG tablet Commonly known as: PEPCID TAKE 1 TABLET(20 MG) BY MOUTH EVERY 12 HOURS   FeroSul 325 (65 FE) MG tablet Generic drug: ferrous sulfate Take 325 mg by mouth  daily.   fluconazole 150 MG tablet Commonly known as: DIFLUCAN Take 150 mg by mouth as needed (when on antibiotics).   fluticasone 50 MCG/ACT nasal spray Commonly known as: FLONASE Place 2 sprays into both nostrils daily.   formoterol 20 MCG/2ML nebulizer solution Commonly known as: Perforomist USE 1 VIAL VIA NEBULIZER TWICE DAILY   hydrOXYzine 25 MG tablet Commonly known as: ATARAX/VISTARIL Take 25 mg by mouth 3 (three) times daily as needed (pain).   ipratropium-albuterol 0.5-2.5 (3) MG/3ML Soln Commonly known as: DUONEB Take 3 mLs by nebulization every 4 (four) hours as needed.   Levonorgestrel-Ethinyl Estradiol 0.15-0.03 &0.01 MG tablet Commonly known as: Seasonique Take 1 tablet by mouth daily.   melatonin 3 MG Tabs tablet Take 2 tablets (6mg ) by mouth at bedtime.   mometasone 50 MCG/ACT nasal spray Commonly known as: NASONEX Use one spray in each nostril once daily as directed.   montelukast 10 MG tablet Commonly known as: SINGULAIR TAKE 1 TABLET(10 MG) BY MOUTH AT BEDTIME   Nebulizer Misc Adult mask with tubing. Use as directed with nebulizer device.   nystatin powder Commonly known as: MYCOSTATIN/NYSTOP Apply 1 application topically 3 (three) times daily.   Olopatadine HCl 0.2 % Soln Can use one drop in each eye once daily if needed for red, itching, watery eyes.   ondansetron 4 MG disintegrating tablet Commonly known as: Zofran ODT Take 2 tablets (8 mg total) by mouth every 8 (eight) hours as needed for nausea or vomiting.   polyethylene glycol powder 17 GM/SCOOP powder Commonly known as: GLYCOLAX/MIRALAX DISSOLVE 1 CAPFUL(17 GRAMS) IN AT LEAST 8 OUNCES OF WATER/JUICE TWICE DAILY   promethazine 25 MG tablet Commonly known as: PHENERGAN Take 1 tablet (25 mg total) by mouth every 6 (six) hours as needed for nausea or vomiting.   senna 8.6 MG tablet Commonly known as: SENOKOT Take 1 tablet by mouth at bedtime.   terconazole 0.4 % vaginal  cream Commonly known as: TERAZOL 7 Place 1 applicator vaginally at bedtime.   traZODone 50 MG tablet Commonly known as: DESYREL TAKE 2 TABLETS(100 MG) BY MOUTH AT BEDTIME   Vitamin D3 1.25 MG (50000 UT) Caps Take 1 capsule by mouth once a week.        Past Medical History:  Diagnosis Date   Asthma    Autism    mom states no actual dx, but admits she has some symptoms   Bacteriuria    Chronic constipation    Cognitive developmental delay    Constipation    COVID-19 03/2020   Disruptive behavior disorder    Dyspnea    if she walks much   Family history of adverse reaction to anesthesia     "hives"-? if anesthesia did take antibiotic   Frequency-urgency syndrome    Gait disorder    ORGANIC PER NERUOLGIST NOTE (DR HICKLING)   Generalized convulsive epilepsy (HCC) NEUROLOGIST-  DR HICKLING   GERD (gastroesophageal reflux disease)    Gout    H/O sinus  tachycardia    History of acute respiratory failure 02/14/2015   SECONDARY TO CAP   History of recurrent UTIs    Incontinent of feces    Interstitial cystitis    Intertrigo 11/29/2019   Moderate intellectual disability    OSA (obstructive sleep apnea)    no machine yet    Partial epilepsy with impairment of consciousness (HCC)    FOLLOWED BY DR HICKLING   Pneumonia 2    4 times this year ( 2020)   PONV (postoperative nausea and vomiting)    Renal cyst 11/29/2019   Seizure (HCC)    most recent sz " at the end of summer"-last sz years ago per mother   Urinary incontinence     Past Surgical History:  Procedure Laterality Date   CYSTOSCOPY N/A 06/03/2017   Procedure: CYSTOSCOPY WITH EXAM UNDER ANESTHESIA;  Surgeon: Jerilee Field, MD;  Location[facility];  Service: Urology;  Laterality: N/A;   CYSTOSCOPY  2020   CYSTOSCOPY WITH HYDRODISTENSION AND BIOPSY  04/14/2012   Procedure: CYSTOSCOPY/BIOPSY/HYDRODISTENSION;  Surgeon: Lindaann Slough, MD;  Location: [facility];  Service:  Urology;;  instillation of marcaine and pyridium   EUA/  PAP SMEAR/  BREAST EXAM  03-12-2016   dr Erin Fulling [facility]   EUA/ VAGINOSCOPY/ COLPOSCOPY FO THE HYMENAL RING  10-04-2009    dr Tamela Oddi  [facility]   IR RADIOLOGIST EVAL & MGMT  07/29/2019   IR RADIOLOGIST EVAL & MGMT  09/30/2019   IR SCLEROTHERAPY OF A FLUID COLLECTION  09/08/2019   RADIOLOGY WITH ANESTHESIA N/A 09/08/2019   Procedure: IR Sclerotherapy Treatment;  Surgeon: Radiologist, Medication, MD;  Location: [facility] OR;  Service: Radiology;  Laterality: N/A;   TONSILLECTOMY      Review of systems negative except as noted in HPI / PMHx or noted below:  Review of Systems  Constitutional: Negative.   HENT: Negative.    Eyes: Negative.   Respiratory: Negative.    Cardiovascular: Negative.   Gastrointestinal: Negative.   Genitourinary: Negative.   Musculoskeletal: Negative.   Skin: Negative.   Neurological: Negative.   Endo/Heme/Allergies: Negative.   Psychiatric/Behavioral: Negative.      Objective:   Vitals:   10/17/20 0838  BP: 126/80  Pulse: (!) 116  Resp: 16  Temp: 98 F (36.7 C)  SpO2: 96%   Height: 4' 9.5" (146.1 cm)  Weight: 161 lb 6.4 oz (73.2 kg)   Physical Exam Constitutional:      Appearance: She is not diaphoretic.  HENT:     Head: Normocephalic.     Right Ear: Tympanic membrane, ear canal and external ear normal.     Left Ear: Tympanic membrane, ear canal and external ear normal.     Nose: Nose normal. No mucosal edema or rhinorrhea.     Mouth/Throat:     Pharynx: Uvula midline. No oropharyngeal exudate.  Eyes:     Conjunctiva/sclera: Conjunctivae normal.  Neck:     Thyroid: No thyromegaly.     Trachea: Trachea normal. No tracheal tenderness or tracheal deviation.  Cardiovascular:     Rate and Rhythm: Normal rate and regular rhythm.     Heart sounds: Normal heart sounds, S1 normal and S2 normal. No murmur heard. Pulmonary:     Effort: No respiratory distress.     Breath sounds: Normal breath  sounds. No stridor. No wheezing or rales.  Lymphadenopathy:     Head:     Right side of head: No tonsillar adenopathy.  Left side of head: No tonsillar adenopathy.     Cervical: No cervical adenopathy.  Skin:    Findings: No erythema or rash.     Nails: There is no clubbing.  Neurological:     Mental Status: She is alert.    Diagnostics: none  Assessment and Plan:   1. Not well controlled severe persistent asthma   2. Other allergic rhinitis   3. Gastroesophageal reflux disease, unspecified whether esophagitis present   4. Urinary tract infection without hematuria, site unspecified     1. Continue a combination of the following:   A. budesonide 1 mg nebulized twice a day  B. Perforomist nebulized twice a day  C. Nasal saline 2 times per day  D. montelukast 10 mg daily  E. cetirizine 10 mg daily  2. Continue to Treat reflux with a combination of the following:   A. Famotidine 40mg  twice a day  B. No caffeine or chocolate consumption  3. "Action plan" for asthma flare up:   A. increase budesonide to 4 times a day  4. Continue albuterol or DUONEB nebulization every 4-6 hours if needed  5. NO MORE prednisone.   6. NO MORE antibiotics for respiratory tract.  7. Start Tezepelumab injections when approved by insurance  8. Treat persistent urinary tract infection. Why is this persistent?   9. Obtain fall flu vaccine  10. Return to clinic 12 weeks or earlier if problem  Rather than give Annet more systemic steroids and more antibiotics for her airway issue we will see if we can get approval for an anti-TSLP antibody to address her respiratory tract inflammation.  I am not that excited about giving her any prednisone in the face of a resilient urinary tract infection involving both Klebsiella and Proteus until that infection is adequately treated by her urologist.  The question of why she has a persistent urinary tract infection does not appear to be answered at least  based upon discussions with her mom today.  I will see her back in this clinic in 12 weeks or earlier if there is a problem.  , MD Allergy / Immunology [facility]

## 2020-10-18 ENCOUNTER — Encounter: Payer: Self-pay | Admitting: Allergy and Immunology

## 2020-10-18 ENCOUNTER — Telehealth: Payer: Self-pay | Admitting: Pulmonary Disease

## 2020-10-18 NOTE — Telephone Encounter (Signed)
She either needs to have an acute visit scheduled with [name], contact her PCP for an acute visit, or go to an urgent care center.

## 2020-10-18 NOTE — Telephone Encounter (Signed)
I called and spoke with mother [name] who stated patient has been in constant pain with recurrent UTIs and has also been wheezing and coughing and mother is worried she is getting PNA. Looks like she saw Dr. Lucie Leather yesterday who increased nebs but did not want to give anymore ABX or prednisone as to not interfere with UTI. Patient just finished Kelflex with no relief. Mother knows patient needs prednisone. She is wondering if she should take her to UC. Nurse at Dr. Lucie Leather office told mother to call here and ask Dr. Craige Cotta. I will send to Dr. Craige Cotta who is in hospital as high priority.  Dr. Craige Cotta, please advise on recs.

## 2020-10-18 NOTE — Telephone Encounter (Signed)
Patient was seen in clinic 10/17/2020 and mom feels she needs an antibiotic to help her, Dr. Lucie Leather please advise.

## 2020-10-18 NOTE — Telephone Encounter (Signed)
Patient's mother called and states that patient is up all night crying due to throat irritation, she is wheezing and whistling. Mother does not want patient to develop pneumonia and thinks she needs an antibiotic.  Uses [facility] on [address].   Please advise.

## 2020-10-18 NOTE — Telephone Encounter (Signed)
I called and spoke with patient mom, who is on DPR, regarding Dr. Craige Cotta recs. Mother verbalized understanding but does not want to keep bringing her out anywhere. She is adamant that she needs prednisone to feel better. She stated she lack transportation and can do a phone visit or she will just have to take her to the Ed. She asked if Dr. Craige Cotta would read Dr. Lucie Leather notes again and see what he said. I informed patient that I will let him know and we will reach out once we hear back.  Dr. Craige Cotta, please advise. Thanks!

## 2020-10-19 ENCOUNTER — Other Ambulatory Visit: Payer: Self-pay | Admitting: Primary Care

## 2020-10-19 ENCOUNTER — Telehealth: Payer: Self-pay | Admitting: Primary Care

## 2020-10-19 DIAGNOSIS — Z9189 Other specified personal risk factors, not elsewhere classified: Secondary | ICD-10-CM | POA: Diagnosis not present

## 2020-10-19 DIAGNOSIS — A499 Bacterial infection, unspecified: Secondary | ICD-10-CM | POA: Diagnosis not present

## 2020-10-19 DIAGNOSIS — R0600 Dyspnea, unspecified: Secondary | ICD-10-CM

## 2020-10-19 DIAGNOSIS — N390 Urinary tract infection, site not specified: Secondary | ICD-10-CM | POA: Diagnosis not present

## 2020-10-19 NOTE — Telephone Encounter (Signed)
Patient's mother called back and states the lung doctor will not give her any antibiotics because of Dr. Kathyrn Lass note. Mother wants Dr. Lucie Leather to take that out because they may be okay with giving her antibiotics if needed. Informed patient that Dr. Lucie Leather states patient can get a strep test with her primary doctor and if it is positive she can get antibiotics. Mother states she is having a hard time getting her primary doctor to do anything and wants to know if a strep test can be done at our office.  Informed mother Dr. Lucie Leather advised patient could use nystatin 42ml and mother would like that called into the pharmacy, [facility] on [address].   Please call mother when medication is called in and if strep test can be done here.

## 2020-10-19 NOTE — Telephone Encounter (Signed)
She can obtain a strep test with her primary care doctor.  She can use nystatin 5 mL swish and gargle and spit 3 times a day for 10 days to treat possible fungal overgrowth.  No antibiotics unless of course strep test is positive

## 2020-10-19 NOTE — Telephone Encounter (Signed)
Spoke with Lori Miles  She says she called [name] back to cancel pt's appt that was scheduled for tomorrow  She states pt has been prescribed abx by urgent care and that she will call us for appt if pt not improving soon  Appt cancelled and will close encounter

## 2020-10-19 NOTE — Telephone Encounter (Signed)
Spoke with the pt's mother, [name] and scheduled appt with Beth for tomorrow at 11 am.

## 2020-10-19 NOTE — Telephone Encounter (Signed)
Dr. Kathyrn Lass note from 10/17/20 clearly states in capital letters "no more prednisone and no more antibiotics for respiratory tract."  I personally have not seen Safina in office since 2019 - she has been seen multiple times since then by nurse practitioners with last visit on 01/20/20 with [name] Parrett.  She needs to have an in office visit to determine if prednisone prescription is appropriate.

## 2020-10-19 NOTE — Progress Notes (Deleted)
@Patient  ID: , female    DOB: 04/13/1995, 25 y.o.   MRN: [id number]  No chief complaint on file.   Referring provider: Scifres, [id number]  HPI: 25 year old female, never smoked. PMH significant for COPD with asthma, chronic rhinitis, OSA on CPAP, hx pneumonia, GERD, chronic constipation, autism spectrum disorder, mental retardation, generalized convulsive epilepsy, obesity. Patient of Dr. 30, last seen by Craige Cotta NP on 01/20/20.   10/20/2020 Patient presents today for acute visit, concern for possible pneumonia. Her mother called out office with reports of UTI symptoms along with wheezing and cough. She just finished Keflex with no improvement. Her mother is adamant that she needs prednisone. She saw Dr. 12/20/2020 on 10/17/20 who increased nebulizer regiment but stated very clearly that she was to have no more prednisone and no more antibiotics for respiratory tract.      Allergies  Allergen Reactions   Abilify [Aripiprazole] Swelling and Palpitations   Seroquel [Quetiapine Fumarate] Palpitations   Amoxicillin-Pot Clavulanate Hives and Other (See Comments)    Has patient had a PCN reaction causing immediate rash, facial/tongue/throat swelling, SOB or lightheadedness with hypotension: No Has patient had a PCN reaction causing severe rash involving mucus membranes or skin necrosis:    #  #  YES  #  #  Has patient had a PCN reaction that required hospitalization: No Has patient had a PCN reaction occurring within the last 10 years: No    Bactrim [Sulfamethoxazole-Trimethoprim] Other (See Comments)    Abdominal pain   Doxycycline Other (See Comments)    Stomach cramps   Macrobid [Nitrofurantoin Macrocrystal] Other (See Comments)    Sharp pain/ given with Tylenol 325 mg   Keppra [Levetiracetam] Other (See Comments)    Aggressive behavior     Immunization History  Administered Date(s) Administered   Pneumococcal Polysaccharide-23 08/06/2016   Tdap 03/03/2017     Past Medical History:  Diagnosis Date   Asthma    Autism    mom states no actual dx, but admits she has some symptoms   Bacteriuria    Chronic constipation    Cognitive developmental delay    Constipation    COVID-19 03/2020   Disruptive behavior disorder    Dyspnea    if she walks much   Family history of adverse reaction to anesthesia     "hives"-? if anesthesia did take antibiotic   Frequency-urgency syndrome    Gait disorder    ORGANIC PER NERUOLGIST NOTE (DR HICKLING)   Generalized convulsive epilepsy (HCC) NEUROLOGIST-  DR HICKLING   GERD (gastroesophageal reflux disease)    Gout    H/O sinus tachycardia    History of acute respiratory failure 02/14/2015   SECONDARY TO CAP   History of recurrent UTIs    Incontinent of feces    Interstitial cystitis    Intertrigo 11/29/2019   Moderate intellectual disability    OSA (obstructive sleep apnea)    no machine yet    Partial epilepsy with impairment of consciousness (HCC)    FOLLOWED BY DR HICKLING   Pneumonia 2    4 times this year ( 2020)   PONV (postoperative nausea and vomiting)    Renal cyst 11/29/2019   Seizure (HCC)    most recent sz " at the end of summer"-last sz years ago per mother   Urinary incontinence     Tobacco History: Social History   Tobacco Use  Smoking Status Never  Smokeless Tobacco Never  Counseling given: Not Answered   Outpatient Medications Prior to Visit  Medication Sig Dispense Refill   acetaminophen (TYLENOL) 325 MG tablet Take 2 tablets (650 mg total) by mouth every 6 (six) hours as needed for mild pain or moderate pain. 60 tablet 0   albuterol (PROVENTIL) (2.5 MG/3ML) 0.083% nebulizer solution Take 3 mLs (2.5 mg total) by nebulization every 4 (four) hours as needed for wheezing or shortness of breath. 150 mL 0   albuterol (VENTOLIN HFA) 108 (90 Base) MCG/ACT inhaler Inhale 2 puffs into the lungs every 6 (six) hours as needed for wheezing or shortness of breath. 18 g 1    benzonatate (TESSALON) 100 MG capsule Take 1 capsule (100 mg total) by mouth every 8 (eight) hours. 21 capsule 0   budesonide (PULMICORT) 1 MG/2ML nebulizer solution USE 1 VIAL VIA NEBULIZER FOUR TIMES DAILY DURING RESPITORY INFECTIONS AND ASTHMA FLARES FOR 1-2 WEEKS 240 mL 3   cephALEXin (KEFLEX) 500 MG capsule Take 500 mg by mouth 3 (three) times daily.     cetirizine (ZYRTEC) 10 MG tablet TAKE 1 TABLET(10 MG) BY MOUTH DAILY 30 tablet 5   Cholecalciferol (VITAMIN D3) 1.25 MG (50000 UT) CAPS Take 1 capsule by mouth once a week.     clotrimazole-betamethasone (LOTRISONE) cream Apply to affected area 2 times daily prn 45 g 0   DEPAKOTE 500 MG DR tablet TAKE 1 TABLET BY MOUTH IN THE MORNING AND 2 TABLETS AT NIGHT 90 tablet 5   famotidine (PEPCID) 20 MG tablet TAKE 1 TABLET(20 MG) BY MOUTH EVERY 12 HOURS 180 tablet 3   FEROSUL 325 (65 Fe) MG tablet Take 325 mg by mouth daily.     fluconazole (DIFLUCAN) 150 MG tablet Take 150 mg by mouth as needed (when on antibiotics).     fluticasone (FLONASE) 50 MCG/ACT nasal spray Place 2 sprays into both nostrils daily. 11.1 mL 0   formoterol (PERFOROMIST) 20 MCG/2ML nebulizer solution USE 1 VIAL VIA NEBULIZER TWICE DAILY 120 mL 5   hydrOXYzine (ATARAX/VISTARIL) 25 MG tablet Take 25 mg by mouth 3 (three) times daily as needed (pain).      ipratropium-albuterol (DUONEB) 0.5-2.5 (3) MG/3ML SOLN Take 3 mLs by nebulization every 4 (four) hours as needed. 360 mL 0   Levonorgestrel-Ethinyl Estradiol (SEASONIQUE) 0.15-0.03 &0.01 MG tablet Take 1 tablet by mouth daily. 28 tablet 3   melatonin 3 MG TABS tablet Take 2 tablets (6mg ) by mouth at bedtime. 60 tablet 0   mometasone (NASONEX) 50 MCG/ACT nasal spray Use one spray in each nostril once daily as directed. 17 g 1   montelukast (SINGULAIR) 10 MG tablet TAKE 1 TABLET(10 MG) BY MOUTH AT BEDTIME 30 tablet 5   mupirocin nasal ointment (BACTROBAN NASAL) 2 % Place 1 application into the nose 3 (three) times daily. Use  in  each nostril twice daily for twenty one (21) days. (Patient taking differently: Place 1 application into the nose 3 (three) times daily. Use  in each nostril twice daily for twenty one (21) days.) 70 g 0   Nebulizer MISC Adult mask with tubing. Use as directed with nebulizer device. 4 each 12   nystatin (MYCOSTATIN/NYSTOP) powder Apply 1 application topically 3 (three) times daily. 15 g 6   Olopatadine HCl 0.2 % SOLN Can use one drop in each eye once daily if needed for red, itching, watery eyes. 2.5 mL 5   ondansetron (ZOFRAN ODT) 4 MG disintegrating tablet Take 2 tablets (8 mg total) by mouth every  8 (eight) hours as needed for nausea or vomiting. 20 tablet 0   polyethylene glycol powder (GLYCOLAX/MIRALAX) 17 GM/SCOOP powder DISSOLVE 1 CAPFUL(17 GRAMS) IN AT LEAST 8 OUNCES OF WATER/JUICE TWICE DAILY 1020 g 2   predniSONE (DELTASONE) 10 MG tablet Take prednisone 40mg  daily x 2 days, 30mg  daily x 2 days, 20mg  daily x 2 days and 10mg  daily x 2 days. 20 tablet 0   promethazine (PHENERGAN) 25 MG tablet Take 1 tablet (25 mg total) by mouth every 6 (six) hours as needed for nausea or vomiting. 30 tablet 0   senna (SENOKOT) 8.6 MG tablet Take 1 tablet by mouth at bedtime.      terconazole (TERAZOL 7) 0.4 % vaginal cream Place 1 applicator vaginally at bedtime. 45 g 2   traZODone (DESYREL) 50 MG tablet TAKE 2 TABLETS(100 MG) BY MOUTH AT BEDTIME 62 tablet 5   No facility-administered medications prior to visit.      Review of Systems  Review of Systems   Physical Exam  There were no vitals taken for this visit. Physical Exam   Lab Results:  CBC    Component Value Date/Time   WBC 6.1 03/26/2020 2052   RBC 4.02 03/26/2020 2052   HGB 12.4 03/26/2020 2052   HCT 37.5 03/26/2020 2052   PLT 191 03/26/2020 2052   MCV 93.3 03/26/2020 2052   MCH 30.8 03/26/2020 2052   MCHC 33.1 03/26/2020 2052   RDW 14.2 03/26/2020 2052   LYMPHSABS 1.9 03/26/2020 2052   MONOABS 1.2 (H) 03/26/2020 2052    EOSABS 0.0 03/26/2020 2052   BASOSABS 0.0 03/26/2020 2052    BMET    Component Value Date/Time   NA 137 03/26/2020 2052   K 3.7 03/26/2020 2052   CL 104 03/26/2020 2052   CO2 22 03/26/2020 2052   GLUCOSE 88 03/26/2020 2052   BUN 12 03/26/2020 2052   CREATININE 0.91 03/26/2020 2052   CREATININE 0.72 11/29/2019 0851   CALCIUM 8.9 03/26/2020 2052   GFRNONAA >60 03/26/2020 2052   GFRNONAA 116 11/29/2019 0851   GFRAA 135 11/29/2019 0851    BNP    Component Value Date/Time   BNP 16.1 10/24/2014 0745    ProBNP    Component Value Date/Time   PROBNP 9.0 05/05/2015 1604    Imaging: No results found.   Assessment & Plan:   No problem-specific Assessment & Plan notes found for this encounter.     12/01/2019, NP 10/19/2020

## 2020-10-19 NOTE — Telephone Encounter (Signed)
Please advise will need to come in to be seen for evaluation , first available spot   Please contact office for sooner follow up if symptoms do not improve or worsen or seek emergency care

## 2020-10-19 NOTE — Telephone Encounter (Addendum)
Spoke to Lori Miles's mother and relayed below recommendations.  Lori Miles stated that she she did not ask for an prednisone, she simply asked for abx. I then proceeded to repeat Dr. Evlyn Courier message and Lonie Peak stated that she is concerned that Lori Miles will develop PNA from UTI as she has a hx. She stated that she received a call from a nurse after hours who stated that she would contact Dr. Kathyrn Lass office. I made her aware that I did not see documentation of this and she said that the nurse told her that she would not be documenting because it was after hours.  She stated that Lori Miles has not seen Dr. Craige Cotta because he is never in clinic. She stated that [facility] will be switching to [employer]. I attempted to explain to Acqunaetta that Lori Miles has to have a primary pulmonologist. She stated that this has been discussed with [name] previously and [name] is agreeable with Lori Miles seeing her only.   Acuquanetta is requesting a call from [name] directly.  [name], please advise. Thanks

## 2020-10-20 ENCOUNTER — Ambulatory Visit: Payer: Medicare Other | Admitting: Primary Care

## 2020-10-26 DIAGNOSIS — J4520 Mild intermittent asthma, uncomplicated: Secondary | ICD-10-CM | POA: Diagnosis not present

## 2020-10-27 ENCOUNTER — Other Ambulatory Visit: Payer: Self-pay

## 2020-10-27 ENCOUNTER — Telehealth: Payer: Self-pay

## 2020-10-27 ENCOUNTER — Other Ambulatory Visit: Payer: Self-pay | Admitting: Obstetrics

## 2020-10-27 DIAGNOSIS — N939 Abnormal uterine and vaginal bleeding, unspecified: Secondary | ICD-10-CM

## 2020-10-27 MED ORDER — MEGESTROL ACETATE 40 MG PO TABS: 40.0000 mg | ORAL_TABLET | Freq: Two times a day (BID) | ORAL | 0 refills | Status: DC

## 2020-10-27 MED ORDER — ESTROGENS CONJUGATED 1.25 MG PO TABS: ORAL_TABLET | ORAL | 0 refills | Status: DC

## 2020-10-27 NOTE — Telephone Encounter (Signed)
TC from patient mother, she reports patient is starting to bleed a lot more since starting a new antibiotic. She is special needs and uses diapers. Discuss with Dr.Harper patient should go to ER to be check out.

## 2020-10-27 NOTE — Telephone Encounter (Signed)
Patient is special needs and Dr.Harper has decided to call in Premarin 1.25 mg for bleeding. F/up appointment on 11/09/2020

## 2020-11-02 DIAGNOSIS — J4520 Mild intermittent asthma, uncomplicated: Secondary | ICD-10-CM | POA: Diagnosis not present

## 2020-11-07 ENCOUNTER — Ambulatory Visit (INDEPENDENT_AMBULATORY_CARE_PROVIDER_SITE_OTHER): Payer: Medicare Other | Admitting: Obstetrics and Gynecology

## 2020-11-07 ENCOUNTER — Other Ambulatory Visit: Payer: Self-pay

## 2020-11-07 ENCOUNTER — Encounter: Payer: Self-pay | Admitting: Obstetrics and Gynecology

## 2020-11-07 DIAGNOSIS — Z3041 Encounter for surveillance of contraceptive pills: Secondary | ICD-10-CM

## 2020-11-07 MED ORDER — SEASONIQUE 0.15-0.03 &0.01 MG PO TABS: 1.0000 | ORAL_TABLET | Freq: Every day | ORAL | 3 refills | Status: DC

## 2020-11-07 MED ORDER — TERCONAZOLE 0.8 % VA CREA: 1.0000 | TOPICAL_CREAM | Freq: Every day | VAGINAL | 6 refills | Status: DC

## 2020-11-07 MED ORDER — NYSTATIN 100000 UNIT/GM EX POWD: 1.0000 "application " | Freq: Three times a day (TID) | CUTANEOUS | 6 refills | Status: DC

## 2020-11-07 MED ORDER — ESTROGENS CONJUGATED 1.25 MG PO TABS: ORAL_TABLET | ORAL | 3 refills | Status: DC

## 2020-11-07 MED ORDER — FLUCONAZOLE 150 MG PO TABS: 150.0000 mg | ORAL_TABLET | ORAL | 3 refills | Status: DC | PRN

## 2020-11-07 NOTE — Progress Notes (Signed)
25 yo P0 presenting for the evaluation of breakthrough bleeding while on COC. Patient is accompanied by her mother. History is provided by mother secondary to autism. She reports a monthly period lasting 5-7 days with intermenstrual bleeding consisting of spotting. Patient denies any pelvic pain or abnormal discharge. Patient is currently taking 2 antibiotics due to recurrent UTI. The intermenstrual bleeding started with the addition of the second antibiotics a few months ago. Patient also reports monthly vulva pruritis associated with recurrent yeast infections and desires refill on diflucan  Past Medical History:  Diagnosis Date   Asthma    Autism    mom states no actual dx, but admits she has some symptoms   Bacteriuria    Chronic constipation    Cognitive developmental delay    Constipation    COVID-19 03/2020   Disruptive behavior disorder    Dyspnea    if she walks much   Family history of adverse reaction to anesthesia     "hives"-? if anesthesia did take antibiotic   Frequency-urgency syndrome    Gait disorder    ORGANIC PER NERUOLGIST NOTE (DR HICKLING)   Generalized convulsive epilepsy (HCC) NEUROLOGIST-  DR HICKLING   GERD (gastroesophageal reflux disease)    Gout    H/O sinus tachycardia    History of acute respiratory failure 02/14/2015   SECONDARY TO CAP   History of recurrent UTIs    Incontinent of feces    Interstitial cystitis    Intertrigo 11/29/2019   Moderate intellectual disability    OSA (obstructive sleep apnea)    no machine yet    Partial epilepsy with impairment of consciousness (HCC)    FOLLOWED BY DR HICKLING   Pneumonia 2    4 times this year ( 2020)   PONV (postoperative nausea and vomiting)    Renal cyst 11/29/2019   Seizure (HCC)    most recent sz " at the end of summer"-last sz years ago per mother   Urinary incontinence    Past Surgical History:  Procedure Laterality Date   CYSTOSCOPY N/A 06/03/2017   Procedure: CYSTOSCOPY WITH EXAM UNDER  ANESTHESIA;  Surgeon: Jerilee Field, MD;  Location: [facility];  Service: Urology;  Laterality: N/A;   CYSTOSCOPY  2020   CYSTOSCOPY WITH HYDRODISTENSION AND BIOPSY  04/14/2012   Procedure: CYSTOSCOPY/BIOPSY/HYDRODISTENSION;  Surgeon: Lindaann Slough, MD;  Location: [facility];  Service: Urology;;  instillation of marcaine and pyridium   EUA/  PAP SMEAR/  BREAST EXAM  03-12-2016   dr Erin Fulling [facility]   EUA/ VAGINOSCOPY/ COLPOSCOPY FO THE HYMENAL RING  10-04-2009    dr Tamela Oddi  [facility]   IR RADIOLOGIST EVAL & MGMT  07/29/2019   IR RADIOLOGIST EVAL & MGMT  09/30/2019   IR SCLEROTHERAPY OF A FLUID COLLECTION  09/08/2019   RADIOLOGY WITH ANESTHESIA N/A 09/08/2019   Procedure: IR Sclerotherapy Treatment;  Surgeon: Radiologist, Medication, MD;  Location: [facility];  Service: Radiology;  Laterality: N/A;   TONSILLECTOMY     Family History  Problem Relation Age of Onset   Seizures Mother    Seizures Sister        1 sister has   Pancreatic cancer Sister    Colon cancer Neg Hx    Colon polyps Neg Hx    Esophageal cancer Neg Hx    Rectal cancer Neg Hx    Stomach cancer Neg Hx    Social History   Tobacco Use   Smoking status: Never  Smokeless tobacco: Never  Vaping Use   Vaping Use: Never used  Substance Use Topics   Alcohol use: No    Alcohol/week: 0.0 standard drinks   Drug use: No   ROS See pertinent in HPI. All other systems reviewed and non contributory Blood pressure 108/75, pulse (!) 109, height 4' 9.5" (1.461 m), weight 161 lb (73 kg). GENERAL: Well-developed, well-nourished female in no acute distress.  NEURO: alert and oriented x 3 Patient not cooperative for a pelvic exam- needs sedation  A/P 25 yo with breakthrough vaginal bleeding on COC likely secondary to antibiotic usage - Refill on Seasonique provided as patient's mother reports improvement in her breakthrough bleeding - Rx diflucan and terazol cream provided. Discussed placing terazol  cream on her fresh diaper to assist with vulva irritation. Dicussed benefits of a skin protector with diaper usage - Discussed changing  contraception to depo-provera or IUD as they are less affected by antibiotic usage - Patient's mother is considering IUD. Can coordinate IUD placement at the time of pap smear under anesthesia - RTC prn for annual exam coordination

## 2020-11-07 NOTE — Progress Notes (Signed)
Pt mother states that she has been having breakthrough bleeding for the last 3-4 months. States she has been on chronic antibiotics which is causing the bleeding. She is currently taking Keflex and changing between either Cipro or Augmentin depending on which bacteria is present at the time. Mother states she does not have the issues as much when she is on the brand name meds of the [facility] and Premarin.

## 2020-11-13 DIAGNOSIS — E7800 Pure hypercholesterolemia, unspecified: Secondary | ICD-10-CM | POA: Diagnosis not present

## 2020-11-13 DIAGNOSIS — Z79899 Other long term (current) drug therapy: Secondary | ICD-10-CM | POA: Diagnosis not present

## 2020-11-13 DIAGNOSIS — N390 Urinary tract infection, site not specified: Secondary | ICD-10-CM | POA: Diagnosis not present

## 2020-11-13 DIAGNOSIS — E119 Type 2 diabetes mellitus without complications: Secondary | ICD-10-CM | POA: Diagnosis not present

## 2020-11-13 DIAGNOSIS — A499 Bacterial infection, unspecified: Secondary | ICD-10-CM | POA: Diagnosis not present

## 2020-11-24 ENCOUNTER — Other Ambulatory Visit: Payer: Self-pay | Admitting: Allergy and Immunology

## 2020-11-28 ENCOUNTER — Ambulatory Visit: Payer: Medicare Other | Admitting: Allergy and Immunology

## 2020-11-30 DIAGNOSIS — J4520 Mild intermittent asthma, uncomplicated: Secondary | ICD-10-CM | POA: Diagnosis not present

## 2020-12-03 DIAGNOSIS — J4520 Mild intermittent asthma, uncomplicated: Secondary | ICD-10-CM | POA: Diagnosis not present

## 2020-12-06 ENCOUNTER — Telehealth: Payer: Self-pay

## 2020-12-06 NOTE — Telephone Encounter (Signed)
Pt's legal guardian Lori Miles, Lori Miles  is scheduled for an ablation consult on 12/13/20. She wants her daughter to have a hysterectomy. She said the ablation did not fix her bleeding and she does not think it will fix her daughter's bleeding either. She said insurance will cover the hysterectomy with the appropriate "insurance code."  Provider notified

## 2020-12-11 ENCOUNTER — Telehealth: Payer: Self-pay

## 2020-12-11 NOTE — Telephone Encounter (Signed)
Informed pt's mom "[name]" appeal for Lori Miles was approved on Saturday.  We will inform pharmacy once their doors open at 9 am.

## 2020-12-12 ENCOUNTER — Other Ambulatory Visit: Payer: Self-pay | Admitting: Obstetrics and Gynecology

## 2020-12-13 ENCOUNTER — Ambulatory Visit: Payer: Medicare Other | Admitting: Obstetrics and Gynecology

## 2020-12-16 ENCOUNTER — Encounter: Payer: Self-pay | Admitting: Obstetrics and Gynecology

## 2020-12-16 DIAGNOSIS — N920 Excessive and frequent menstruation with regular cycle: Secondary | ICD-10-CM | POA: Insufficient documentation

## 2020-12-16 NOTE — Progress Notes (Signed)
Pt canceled appt. 

## 2020-12-22 ENCOUNTER — Telehealth (INDEPENDENT_AMBULATORY_CARE_PROVIDER_SITE_OTHER): Payer: Self-pay

## 2020-12-22 DIAGNOSIS — G40309 Generalized idiopathic epilepsy and epileptic syndromes, not intractable, without status epilepticus: Secondary | ICD-10-CM

## 2020-12-22 DIAGNOSIS — G40209 Localization-related (focal) (partial) symptomatic epilepsy and epileptic syndromes with complex partial seizures, not intractable, without status epilepticus: Secondary | ICD-10-CM

## 2020-12-22 NOTE — Telephone Encounter (Signed)
I called Mom to check to see if Lori Miles had enough Depakote for the weekend. Mom said that she did so I will send in the refill on Monday. TG

## 2020-12-25 MED ORDER — DEPAKOTE 500 MG PO TBEC: DELAYED_RELEASE_TABLET | ORAL | 5 refills | Status: DC

## 2020-12-26 ENCOUNTER — Ambulatory Visit (INDEPENDENT_AMBULATORY_CARE_PROVIDER_SITE_OTHER): Payer: Medicare Other | Admitting: Family

## 2020-12-26 ENCOUNTER — Other Ambulatory Visit: Payer: Self-pay

## 2020-12-26 ENCOUNTER — Encounter (INDEPENDENT_AMBULATORY_CARE_PROVIDER_SITE_OTHER): Payer: Self-pay | Admitting: Family

## 2020-12-26 VITALS — BP 110/78 | HR 110 | Ht <= 58 in | Wt 161.0 lb

## 2020-12-26 DIAGNOSIS — G40309 Generalized idiopathic epilepsy and epileptic syndromes, not intractable, without status epilepticus: Secondary | ICD-10-CM | POA: Diagnosis not present

## 2020-12-26 DIAGNOSIS — G4700 Insomnia, unspecified: Secondary | ICD-10-CM | POA: Diagnosis not present

## 2020-12-26 DIAGNOSIS — G40209 Localization-related (focal) (partial) symptomatic epilepsy and epileptic syndromes with complex partial seizures, not intractable, without status epilepticus: Secondary | ICD-10-CM

## 2020-12-26 DIAGNOSIS — Z79899 Other long term (current) drug therapy: Secondary | ICD-10-CM

## 2020-12-26 DIAGNOSIS — F819 Developmental disorder of scholastic skills, unspecified: Secondary | ICD-10-CM

## 2020-12-26 DIAGNOSIS — R296 Repeated falls: Secondary | ICD-10-CM

## 2020-12-26 DIAGNOSIS — R269 Unspecified abnormalities of gait and mobility: Secondary | ICD-10-CM

## 2020-12-26 DIAGNOSIS — F840 Autistic disorder: Secondary | ICD-10-CM

## 2020-12-28 DIAGNOSIS — Z79899 Other long term (current) drug therapy: Secondary | ICD-10-CM | POA: Diagnosis not present

## 2020-12-28 NOTE — Progress Notes (Signed)
Lori Miles   MRN:  [id number]  Mar 04, 1996   Provider: Elveria Rising NP-C Location of Care: [facility]  Visit type: Return visit  Last visit: 07/04/20  Referral source: Clementeen Graham, PA-C History from: Epic chart and patient's mother  Brief history:  Copied from previous record: Generalized and convulsive epilepsy, intellectual disability, autism spectrum disorder, disordered sleep and gait disorder. She also has asthma and history of frequent urinary tract infections. She is taking and tolerating Depakote for seizures and Trazodone for insomnia.  Today's concerns: Mom reports today that Lori Miles continues to have frequent UTI's and when she has an infection that she has more fumbling movements and staring behaviors. Mom feels that these are seizures and said that she did similar behaviors when she was young and first diagnosed. Mom reports that Lori Miles has not missed medication doses. She has ongoing problems with insomnia and sometimes awakens very early, then remains awake after that.   Mom has also notes that Lori Miles is not walking as well as when she was receiving PT and wonders if that can be restarted. She has tried taking her to a gym to ride a stationary bike but says that Lori Miles is easily distracted in public places.   Rodnesha has been otherwise generally healthy since she was last seen. Mom has no other health concerns for her today other than previously mentioned.  Review of systems: Please see HPI for neurologic and other pertinent review of systems. Otherwise all other systems were reviewed and were negative.  Problem List: Patient Active Problem List   Diagnosis Date Noted   Menorrhagia 12/16/2020   Renal cyst 11/29/2019   Intertrigo 11/29/2019   Complex care coordination 05/18/2019   Screening for cervical cancer 02/26/2019   Glucosuria 06/01/2018   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   Not well controlled moderate persistent  asthma 04/29/2018   Gastroesophageal reflux disease 04/29/2018   Recurrent infections 04/29/2018   Dysuria 02/20/2018   Chronic rhinitis 04/03/2017   Recurrent falls 03/21/2017   Weakness 03/21/2017   Venous insufficiency 03/21/2017   OSA on CPAP 09/30/2016   Excessive daytime sleepiness 03/05/2016   Insomnia 10/04/2015   Bronchitis 05/05/2015   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 if anesthesia did take antibiotic   Frequency-urgency syndrome    Gait disorder    ORGANIC PER NERUOLGIST NOTE (DR HICKLING)   Generalized  convulsive epilepsy (HCC) NEUROLOGIST-  DR HICKLING   GERD (gastroesophageal reflux disease)    Gout    H/O sinus tachycardia    History of acute respiratory failure 02/14/2015   SECONDARY TO CAP   History of recurrent UTIs    Incontinent of feces    Interstitial cystitis    Intertrigo 11/29/2019   Moderate intellectual  disability    OSA (obstructive sleep apnea)    no machine yet    Partial epilepsy with impairment of consciousness (HCC)    FOLLOWED BY DR HICKLING   Pneumonia 2    4 times this year ( 2020)   PONV (postoperative nausea and vomiting)    Renal cyst 11/29/2019   Seizure (HCC)    most recent sz " at the end of summer"-last sz years ago per mother   Urinary incontinence     Past medical history comments: See HPI Copied from previous record: EEG on September 14, 2011, was normal. She was able to be successfully weaned off Keppra. The patient has significant issues with aggression, which was thought to be related to Keppra, but has persisted once it was discontinued. The patient was treated with Depakote both for behavior and possible seizures. She had palpitations and severe constipation on Seroquel. She had marked weight gain on Abilify and Depakote. When generic Divalproex was tried she had breakthrough seizures.      Nocturnal polysomnogram on October 27, 2011, failed to show significant periodic limb movements, cyanosis, sleep apnea, or cardiac arrhythmia.  EEG on February 20, 2012, was a normal study with the patient awake.    EEG performed March 25, 2017 was normal   EEG performed May 21, 2018 was normal  Surgical history: Past Surgical History:  Procedure Laterality Date   CYSTOSCOPY N/A 06/03/2017   Procedure: CYSTOSCOPY WITH EXAM UNDER ANESTHESIA;  Surgeon: Jerilee Field, MD;  Location: [facility];  Service: Urology;  Laterality: N/A;   CYSTOSCOPY  2020   CYSTOSCOPY WITH HYDRODISTENSION AND BIOPSY  04/14/2012   Procedure: CYSTOSCOPY/BIOPSY/HYDRODISTENSION;  Surgeon: Lindaann Slough, MD;  Location: [facility];  Service: Urology;;  instillation of marcaine and pyridium   EUA/  PAP SMEAR/  BREAST EXAM  03-12-2016   dr Erin Fulling [facility]   EUA/ VAGINOSCOPY/ COLPOSCOPY FO THE HYMENAL RING  10-04-2009    dr Tamela Oddi  [facility]   IR RADIOLOGIST EVAL & MGMT   07/29/2019   IR RADIOLOGIST EVAL & MGMT  09/30/2019   IR SCLEROTHERAPY OF A FLUID COLLECTION  09/08/2019   RADIOLOGY WITH ANESTHESIA N/A 09/08/2019   Procedure: IR Sclerotherapy Treatment;  Surgeon: Radiologist, Medication, MD;  Location: [facility];  Service: Radiology;  Laterality: N/A;   TONSILLECTOMY       Family history: family history includes Pancreatic cancer in her sister; Seizures in her mother and sister.   Social history: Social History   Socioeconomic History   Marital status: Single    Spouse name: Not on file   Number of children: Not on file   Years of education: Not on file   Highest education level: Not on file  Occupational History   Not on file  Tobacco Use   Smoking status: Never   Smokeless tobacco: Never  Vaping Use   Vaping Use: Never used  Substance and Sexual Activity   Alcohol use: No    Alcohol/week: 0.0 standard drinks   Drug use: No   Sexual activity: Never    Birth control/protection: Pill  Other Topics  Concern   Not on file  Social History Narrative   Lives with mom ([name] Malmquist) and half-sister Chryl Holten) who is also mentally impaired.  Has CAP assistance, urinary incontinence, needs help with feeding,dressing, toileting   Graduated from [employer].  She enjoys playing with cars, dancing, and dogs.   Social Determinants of Health   Financial Resource Strain: Not on file  Food Insecurity: Not on file  Transportation Needs: Not on file  Physical Activity: Not on file  Stress: Not on file  Social Connections: Not on file  Intimate Partner Violence: Not on file    Past/failed meds: Copied from previous record: Levetiracetam - aggressive behavior   Allergies: Allergies  Allergen Reactions   Abilify [Aripiprazole] Swelling and Palpitations   Seroquel [Quetiapine Fumarate] Palpitations   Amoxicillin-Pot Clavulanate Hives and Other (See Comments)    Has patient had a PCN reaction causing immediate rash, facial/tongue/throat  swelling, SOB or lightheadedness with hypotension: No Has patient had a PCN reaction causing severe rash involving mucus membranes or skin necrosis:    #  #  YES  #  #  Has patient had a PCN reaction that required hospitalization: No Has patient had a PCN reaction occurring within the last 10 years: No    Bactrim [Sulfamethoxazole-Trimethoprim] Other (See Comments)    Abdominal pain   Doxycycline Other (See Comments)    Stomach cramps   Macrobid [Nitrofurantoin Macrocrystal] Other (See Comments)    Sharp pain/ given with Tylenol 325 mg   Keppra [Levetiracetam] Other (See Comments)    Aggressive behavior     Immunizations: Immunization History  Administered Date(s) Administered   Pneumococcal Polysaccharide-23 08/06/2016   Tdap 03/03/2017    Diagnostics/Screenings: Copied from previous record: 12/15/19 rEEG - This EEG is normal during awake and asleep states. Please note that normal EEG does not exclude epilepsy, clinical correlation is indicated.  Keturah Shavers, MD   03/27/17 - CT head - normal   Physical Exam: BP 110/78   Pulse (!) 110   Ht 4' 9.5" (1.461 m)   Wt 161 lb (73 kg)   BMI 34.24 kg/m   Wt Readings from Last 3 Encounters:  12/26/20 161 lb (73 kg)  11/07/20 161 lb (73 kg)  10/17/20 161 lb 6.4 oz (73.2 kg)  General: well developed, well nourished young woman seated, in no evident distress Head: normocephalic and atraumatic. Oropharynx benign. No dysmorphic features. Neck: supple Cardiovascular: regular rate and rhythm, no murmurs. Respiratory: clear to auscultation bilaterally Abdomen: bowel sounds present all four quadrants, abdomen soft, non-tender, non-distended.  Musculoskeletal: no skeletal deformities or obvious scoliosis. Skin: no rashes or neurocutaneous lesions  Neurologic Exam Mental Status: awake and fully alert. Very limited language. Variable eye contact. Smiles responsively. Able to follow a very few simple commands but needs frequent  redirection. Cranial Nerves: fundoscopic exam - red reflex present.  Unable to fully visualize fundus.  Pupils equal briskly reactive to light.  Turns to localize faces and objects in the periphery. Turns to localize sounds in the periphery.  Motor: normal functional bulk, tone and strength Sensory: withdrawal x 4 Coordination: unable to adequately assess due to patient's inability to participate in examination. No dysmetria when reaching for objects. Gait and Station: able to stand and bear weight. Able to take steps but has poor balance and needs support. The left toes turn in when walking.  Impression: Generalized convulsive epilepsy (HCC) - Plan: Valproic acid level, Valproic acid level  Partial epilepsy with impairment  of consciousness (HCC) - Plan: Valproic acid level, Valproic acid level  Encounter for long-term (current) use of high-risk medication - Plan: Valproic acid level, Valproic acid level  Insomnia, unspecified type  Autism spectrum disorder - Plan: Ambulatory referral to Physical Therapy  Cognitive developmental delay - Plan: Ambulatory referral to Physical Therapy  Abnormality of gait - Plan: Ambulatory referral to Physical Therapy  Recurrent falls - Plan: Ambulatory referral to Physical Therapy  Obesity, morbid (HCC) - Plan: Ambulatory referral to Physical Therapy   Recommendations for plan of care: The patient's previous [facility] records were reviewed. Lori Miles has neither had nor required imaging or lab studies since the last visit, other than what has been performed by other providers. She is a 25 year old woman with autism, cognitive delay, epilepsy, problems walking and insomnia. She has frequent UTI's and Mom feels that Lori Miles is having partial or focal seizures when UTI's are present. I recommended checking a Depakote level, and asked Mom to video any possible seizure episodes for me to see. If this continues we can also perform an EEG to better evaluate for seizure  activity. I will call Mom when I receive the lab results.   Lori Miles is having ongoing problems with walking and I recommended another course of PT. I will send an order for that.   I will otherwise see Lori Miles back in follow up in 6 months or sooner if needed. Mom agreed with the plans made today.   The medication list was reviewed and reconciled. No changes were made in the prescribed medications today. A complete medication list was provided to the patient.  Orders Placed This Encounter  Procedures   Valproic acid level    Must be drawn in the early morning before his first dose of medication of the day    Standing Status:   Future    Number of Occurrences:   1    Standing Expiration Date:   03/28/2021   Ambulatory referral to Physical Therapy    Referral Priority:   Routine    Referral Type:   Physical Medicine    Referral Reason:   Specialty Services Required    Requested Specialty:   Physical Therapy    Number of Visits Requested:   1    Return in about 6 months (around 06/26/2021).   Allergies as of 12/26/2020       Reactions   Abilify [aripiprazole] Swelling, Palpitations   Seroquel [quetiapine Fumarate] Palpitations   Amoxicillin-pot Clavulanate Hives, Other (See Comments)   Has patient had a PCN reaction causing immediate rash, facial/tongue/throat swelling, SOB or lightheadedness with hypotension: No Has patient had a PCN reaction causing severe rash involving mucus membranes or skin necrosis:    #  #  YES  #  #  Has patient had a PCN reaction that required hospitalization: No Has patient had a PCN reaction occurring within the last 10 years: No   Bactrim [sulfamethoxazole-trimethoprim] Other (See Comments)   Abdominal pain   Doxycycline Other (See Comments)   Stomach cramps   Macrobid [nitrofurantoin Macrocrystal] Other (See Comments)   Sharp pain/ given with Tylenol 325 mg   Keppra [levetiracetam] Other (See Comments)   Aggressive behavior         Medication  List        Accurate as of December 26, 2020 11:59 PM. If you have any questions, ask your nurse or doctor.          acetaminophen 325 MG tablet Commonly  known as: TYLENOL Take 2 tablets (650 mg total) by mouth every 6 (six) hours as needed for mild pain or moderate pain.   albuterol (2.5 MG/3ML) 0.083% nebulizer solution Commonly known as: PROVENTIL Take 3 mLs (2.5 mg total) by nebulization every 4 (four) hours as needed for wheezing or shortness of breath.   albuterol 108 (90 Base) MCG/ACT inhaler Commonly known as: VENTOLIN HFA Inhale 2 puffs into the lungs every 6 (six) hours as needed for wheezing or shortness of breath.   amoxicillin-clavulanate 500-125 MG tablet Commonly known as: AUGMENTIN Take 1 tablet by mouth 2 (two) times daily.   Bactroban Nasal 2 % Generic drug: mupirocin nasal ointment Place 1 application into the nose 3 (three) times daily. Use  in each nostril twice daily for twenty one (21) days.   benzonatate 100 MG capsule Commonly known as: TESSALON Take 1 capsule (100 mg total) by mouth every 8 (eight) hours.   budesonide 1 MG/2ML nebulizer solution Commonly known as: PULMICORT USE 1 VIAL VIA NEBULIZER FOUR TIMES DAILY DURING RESPITORY INFECTIONS AND ASTHMA FLARES FOR 1-2 WEEKS   cephALEXin 500 MG capsule Commonly known as: KEFLEX Take 500 mg by mouth 3 (three) times daily.   cetirizine 10 MG tablet Commonly known as: ZYRTEC TAKE 1 TABLET(10 MG) BY MOUTH DAILY   ciprofloxacin 500 MG tablet Commonly known as: CIPRO Take 500 mg by mouth 2 (two) times daily.   clotrimazole-betamethasone cream Commonly known as: LOTRISONE Apply to affected area 2 times daily prn   Depakote 500 MG DR tablet Generic drug: divalproex TAKE 1 TABLET BY MOUTH IN THE MORNING AND 2 TABLETS AT NIGHT   estrogens (conjugated) 1.25 MG tablet Commonly known as: Premarin TAKE ONE BY MOUTH TWICE PER DAY   famotidine 20 MG tablet Commonly known as: PEPCID TAKE 1  TABLET(20 MG) BY MOUTH EVERY 12 HOURS   FeroSul 325 (65 FE) MG tablet Generic drug: ferrous sulfate Take 325 mg by mouth daily.   fluconazole 150 MG tablet Commonly known as: DIFLUCAN Take 1 tablet (150 mg total) by mouth as needed (when on antibiotics).   fluticasone 50 MCG/ACT nasal spray Commonly known as: FLONASE Place 2 sprays into both nostrils daily.   fluticasone 50 MCG/ACT nasal spray Commonly known as: FLONASE Place into the nose.   formoterol 20 MCG/2ML nebulizer solution Commonly known as: Perforomist USE 1 VIAL VIA NEBULIZER TWICE DAILY   hydrOXYzine 25 MG tablet Commonly known as: ATARAX/VISTARIL Take 25 mg by mouth 3 (three) times daily as needed (pain).   ipratropium-albuterol 0.5-2.5 (3) MG/3ML Soln Commonly known as: DUONEB Take 3 mLs by nebulization every 4 (four) hours as needed.   melatonin 3 MG Tabs tablet Take 2 tablets (6mg ) by mouth at bedtime.   methenamine 1 g tablet Commonly known as: HIPREX Take by mouth.   mometasone 50 MCG/ACT nasal spray Commonly known as: NASONEX Use one spray in each nostril once daily as directed.   montelukast 10 MG tablet Commonly known as: SINGULAIR TAKE 1 TABLET(10 MG) BY MOUTH AT BEDTIME   Nebulizer Misc Adult mask with tubing. Use as directed with nebulizer device.   nystatin powder Commonly known as: MYCOSTATIN/NYSTOP Apply 1 application topically 3 (three) times daily.   Olopatadine HCl 0.2 % Soln Can use one drop in each eye once daily if needed for red, itching, watery eyes.   ondansetron 4 MG disintegrating tablet Commonly known as: Zofran ODT Take 2 tablets (8 mg total) by mouth every 8 (eight) hours  as needed for nausea or vomiting.   polyethylene glycol powder 17 GM/SCOOP powder Commonly known as: GLYCOLAX/MIRALAX DISSOLVE 1 CAPFUL(17 GRAMS) IN AT LEAST 8 OUNCES OF WATER/JUICE TWICE DAILY   predniSONE 10 MG tablet Commonly known as: DELTASONE Take prednisone 40mg  daily x 2 days, 30mg   daily x 2 days, 20mg  daily x 2 days and 10mg  daily x 2 days.   promethazine 25 MG tablet Commonly known as: PHENERGAN Take 1 tablet (25 mg total) by mouth every 6 (six) hours as needed for nausea or vomiting.   Seasonique 0.15-0.03 &0.01 MG tablet Generic drug: Levonorgestrel-Ethinyl Estradiol Take 1 tablet by mouth daily.   senna 8.6 MG tablet Commonly known as: SENOKOT Take 1 tablet by mouth at bedtime.   terconazole 0.4 % vaginal cream Commonly known as: TERAZOL 7 Place 1 applicator vaginally at bedtime.   terconazole 0.8 % vaginal cream Commonly known as: TERAZOL 3 Place 1 applicator vaginally at bedtime. Apply nightly for three nights.   traZODone 50 MG tablet Commonly known as: DESYREL TAKE 2 TABLETS(100 MG) BY MOUTH AT BEDTIME   Vitamin D3 1.25 MG (50000 UT) Caps Take 1 capsule by mouth once a week.        Total time spent with the patient was 30 minutes, of which 50% or more was spent in counseling and coordination of care.  Elveria Rising NP-C [facility] Ph. [phone] Fax [phone]

## 2020-12-29 ENCOUNTER — Encounter (INDEPENDENT_AMBULATORY_CARE_PROVIDER_SITE_OTHER): Payer: Self-pay | Admitting: Family

## 2020-12-29 DIAGNOSIS — Z79899 Other long term (current) drug therapy: Secondary | ICD-10-CM | POA: Insufficient documentation

## 2020-12-29 LAB — VALPROIC ACID LEVEL: Valproic Acid Lvl: 76 mg/L (ref 50.0–100.0)

## 2020-12-29 NOTE — Patient Instructions (Addendum)
Thank you for coming in today.   Instructions for you until your next appointment are as follows: I have given you a blood test order to check the Depakote level. I will call you when I receive the results.  Be sure to video any possible seizure events. Let me know if you are able to video anything and I will arrange a time to see your video.  I will order PT for [place] to help with walking.  Be sure to follow up with her other specialists as scheduled Please sign up for MyChart if you have not done so. Please plan to return for follow up in 6 months or sooner if needed.  [facility], we are committed to providing exceptional care. You will receive a patient satisfaction survey through text or email regarding your visit today. Your opinion is important to me. Comments are appreciated.

## 2021-01-02 DIAGNOSIS — J4520 Mild intermittent asthma, uncomplicated: Secondary | ICD-10-CM | POA: Diagnosis not present

## 2021-01-07 DIAGNOSIS — R8290 Unspecified abnormal findings in urine: Secondary | ICD-10-CM | POA: Diagnosis not present

## 2021-01-09 ENCOUNTER — Other Ambulatory Visit: Payer: Self-pay | Admitting: Obstetrics

## 2021-01-09 DIAGNOSIS — Z710 Person encountering health services to consult on behalf of another person: Secondary | ICD-10-CM

## 2021-01-22 ENCOUNTER — Ambulatory Visit: Payer: Medicare Other | Attending: Family | Admitting: Physical Therapy

## 2021-01-22 ENCOUNTER — Encounter: Payer: Self-pay | Admitting: Physical Therapy

## 2021-01-22 ENCOUNTER — Other Ambulatory Visit: Payer: Self-pay

## 2021-01-22 DIAGNOSIS — R2689 Other abnormalities of gait and mobility: Secondary | ICD-10-CM | POA: Diagnosis not present

## 2021-01-22 DIAGNOSIS — R296 Repeated falls: Secondary | ICD-10-CM | POA: Diagnosis not present

## 2021-01-22 DIAGNOSIS — M6281 Muscle weakness (generalized): Secondary | ICD-10-CM | POA: Diagnosis not present

## 2021-01-22 DIAGNOSIS — R269 Unspecified abnormalities of gait and mobility: Secondary | ICD-10-CM | POA: Insufficient documentation

## 2021-01-22 DIAGNOSIS — F840 Autistic disorder: Secondary | ICD-10-CM | POA: Diagnosis not present

## 2021-01-22 DIAGNOSIS — R2681 Unsteadiness on feet: Secondary | ICD-10-CM | POA: Diagnosis not present

## 2021-01-22 DIAGNOSIS — F819 Developmental disorder of scholastic skills, unspecified: Secondary | ICD-10-CM | POA: Insufficient documentation

## 2021-01-22 NOTE — Therapy (Signed)
[facility] [address] [place], [place], [address] Phone: [phone]   Fax:  [phone]  Physical Therapy Evaluation  Patient Details  Name: Lori Miles MRN: [id number] Date of Birth: 1995/07/14 Referring Provider (PT): Elveria Rising, NP   Encounter Date: 01/22/2021   PT End of Session - 01/23/21 2007     Visit Number 1    Number of Visits 7    Authorization Type [employer] Medicare    Authorization Time Period 01-22-21 - 03-24-21    PT Start Time 0800    PT Stop Time 0846    PT Time Calculation (min) 46 min    Activity Tolerance Patient tolerated treatment well    Behavior During Therapy [facility] for tasks assessed/performed             Past Medical History:  Diagnosis Date   Asthma    Autism    mom states no actual dx, but admits she has some symptoms   Bacteriuria    Chronic constipation    Cognitive developmental delay    Constipation    COVID-19 03/2020   Disruptive behavior disorder    Dyspnea    if she walks much   Family history of adverse reaction to anesthesia     "hives"-? if anesthesia did take antibiotic   Frequency-urgency syndrome    Gait disorder    ORGANIC PER NERUOLGIST NOTE (DR HICKLING)   Generalized convulsive epilepsy (HCC) NEUROLOGIST-  DR HICKLING   GERD (gastroesophageal reflux disease)    Gout    H/O sinus tachycardia    History of acute respiratory failure 02/14/2015   SECONDARY TO CAP   History of recurrent UTIs    Incontinent of feces    Interstitial cystitis    Intertrigo 11/29/2019   Moderate intellectual disability    OSA (obstructive sleep apnea)    no machine yet    Partial epilepsy with impairment of consciousness (HCC)    FOLLOWED BY DR HICKLING   Pneumonia 2    4 times this year ( 2020)   PONV (postoperative nausea and vomiting)    Renal cyst 11/29/2019   Seizure (HCC)    most recent sz " at the end of summer"-last sz years ago per mother   Urinary  incontinence     Past Surgical History:  Procedure Laterality Date   CYSTOSCOPY N/A 06/03/2017   Procedure: CYSTOSCOPY WITH EXAM UNDER ANESTHESIA;  Surgeon: Jerilee Field, MD;  Location: [facility];  Service: Urology;  Laterality: N/A;   CYSTOSCOPY  2020   CYSTOSCOPY WITH HYDRODISTENSION AND BIOPSY  04/14/2012   Procedure: CYSTOSCOPY/BIOPSY/HYDRODISTENSION;  Surgeon: Lindaann Slough, MD;  Location: [facility];  Service: Urology;;  instillation of marcaine and pyridium   EUA/  PAP SMEAR/  BREAST EXAM  03-12-2016   dr Erin Fulling [facility]   EUA/ VAGINOSCOPY/ COLPOSCOPY FO THE HYMENAL RING  10-04-2009    dr Tamela Oddi  [facility]   IR RADIOLOGIST EVAL & MGMT  07/29/2019   IR RADIOLOGIST EVAL & MGMT  09/30/2019   IR SCLEROTHERAPY OF A FLUID COLLECTION  09/08/2019   RADIOLOGY WITH ANESTHESIA N/A 09/08/2019   Procedure: IR Sclerotherapy Treatment;  Surgeon: Radiologist, Medication, MD;  [facility];  Service: Radiology;  Laterality: N/A;   TONSILLECTOMY      There were no vitals filed for this visit.         [facility] PT Assessment - 01/23/21 2001  Assessment   Medical Diagnosis Gait Abnormality    Referring Provider (PT) Rockwell Germany, NP    Onset Date/Surgical Date --   Referrral date 12-29-20   Hand Dominance Right    Prior Therapy March - May 2022 for 7 visits (at this facility)      Precautions   Precautions Fall      Restrictions   Weight Bearing Restrictions No      Balance Screen   Has the patient fallen in the past 6 months No    Has the patient had a decrease in activity level because of a fear of falling?  No    Is the patient reluctant to leave their home because of a fear of falling?  No      Home Environment   Living Environment Private residence    Type of [place] Access Level entry      Prior Function   Level of Independence Independent with household mobility without device;Needs assistance with ADLs;Independent  with basic ADLs;Needs assistance with gait   supervision needed due to decr. cognition     ROM / Strength   AROM / PROM / Strength Strength      Strength   Overall Strength Deficits    Strength Assessment Site Hip;Knee;Ankle    Right/Left Hip Right;Left    Right Hip Flexion 4-/5    Left Hip Flexion 3+/5    Right/Left Knee Right;Left    Right Knee Flexion 3+/5    Right Knee Extension 4/5    Left Knee Flexion 3+/5    Left Knee Extension 4/5    Right/Left Ankle Right;Left    Right Ankle Dorsiflexion 4/5    Left Ankle Dorsiflexion 4-/5      Transfers   Transfers Sit to Stand;Stand to Sit    Sit to Stand 5: Supervision    Five time sit to stand comments  15.93    Stand to Sit 5: Supervision    Comments no UE support used from mat table      Ambulation/Gait   Ambulation/Gait Yes    Ambulation/Gait Assistance 5: Supervision    Ambulation Distance (Feet) 350 Feet    Assistive device None    Gait velocity 18.38 = 1.78 ft/sec without device    Stairs Yes    Stairs Assistance 5: Supervision    Stair Management Technique Two rails;Alternating pattern;Step to pattern   alternating with ascension; step to with descension   Number of Stairs 4    Height of Stairs 6    Gait Comments 3 laps (350') in  2'14.62      Standardized Balance Assessment   Standardized Balance Assessment Timed Up and Go Test      Timed Up and Go Test   Normal TUG (seconds) 16.93                        Objective measurements completed on examination: See above findings.             feet together 17 secs, 13 secs with EO  Feet together with EC 10 secs  SLS approx. 2-3 secs on each LE  Picked up tissue box off floor with SBA   PT Education - 01/23/21 2006     Education Details eval results discussed with pt and mother - with POC and LOS discussed    Person(s) Educated Patient;Parent(s)    Methods Explanation    Comprehension Verbalized understanding  PT  Short Term Goals - 01/23/21 2026       PT SHORT TERM GOAL #1   Title Improve TUG score from 16.93 secs to </= 15 secs without device to reduce fall risk.    Baseline 16.93 secs without device    Time 3    Period Weeks    Status New    Target Date 02/16/21      PT SHORT TERM GOAL #2   Title Perform HEP for balance and strengthening with mother's assist.    Time 3    Period Weeks    Status New    Target Date 02/16/21               PT Long Term Goals - 01/23/21 2029       PT LONG TERM GOAL #1   Title Pt. will amb. 500' on flat, even surface without LOB.    Time 6    Period Weeks    Status New    Target Date 03/09/21      PT LONG TERM GOAL #2   Title Pt will perform at least 10" on recumbent bike nonstop for improved endurance/activity tolerance.    Time 6    Period Weeks    Status New    Target Date 03/09/21      PT LONG TERM GOAL #3   Title Improve TUG score from 16.93 to </= 13.5 secs without device.    Baseline 16.93 secs without device    Time 6    Period Weeks    Status New    Target Date 03/09/21      PT LONG TERM GOAL #4   Title Improve 5x sit to stand score from 15.93  secs to </= 12 secs to demo improved LE strength.    Baseline 15.93 secs without UE support from high/low mat table    Time 6    Period Weeks    Status New    Target Date 03/09/21                    Plan - 01/23/21 2016     Clinical Impression Statement Pt is a 25 yr old female with autism, developmental delay and gait disorder who presents with abnormal gait pattern with LLE internally rotated, increased Rt pelvic retraction in stance and decreased push off on LLE in stance due to weakness in Lt plantarflexors.  Pt also demonstrates decreased balance and functional mobility with TUG score of 16.93 secs, indicative of fall risk as score > 13.5 secs.  Pt also demonstrates decreased activity tolerance/decr. endurance as she was able to amb. 350' without device in 2" 15 secs and  required seated rest period after this time.  Pt will benefit from PT to address gait & balance deficits and LE weakness.    Personal Factors and Comorbidities Behavior Pattern;Comorbidity 1;Past/Current Experience;Fitness;Time since onset of injury/illness/exacerbation    Comorbidities autism, developmental delay, gait disorder, asthma    Examination-Activity Limitations Locomotion Level;Transfers;Squat;Stairs;Stand;Carry    Examination-Participation Restrictions Cleaning;Community Activity;Interpersonal Relationship;Laundry;Shop;Meal Prep    Stability/Clinical Decision Making Stable/Uncomplicated    Clinical Decision Making Moderate    Rehab Potential Fair    PT Frequency 1x / week    PT Duration 6 weeks    PT Treatment/Interventions ADLs/Self Care Home Management;Gait training;Stair training;Functional mobility training;Therapeutic activities;Therapeutic exercise;Balance training;Patient/family education;Neuromuscular re-education    PT Next Visit Plan LE strengthening & balance training    Consulted and Agree with Plan  of Care Patient;Family member/caregiver    Family Member Consulted mother             Patient will benefit from skilled therapeutic intervention in order to improve the following deficits and impairments:  Abnormal gait, Decreased activity tolerance, Decreased balance, Decreased coordination, Decreased strength, Decreased endurance  Visit Diagnosis: Other abnormalities of gait and mobility - Plan: PT plan of care cert/re-cert  Muscle weakness (generalized) - Plan: PT plan of care cert/re-cert  Unsteadiness on feet - Plan: PT plan of care cert/re-cert     Problem List Patient Active Problem List   Diagnosis Date Noted   Encounter for long-term (current) use of high-risk medication 12/29/2020   Menorrhagia 12/16/2020   Renal cyst 11/29/2019   Intertrigo 11/29/2019   Complex care coordination 05/18/2019   Screening for cervical cancer 02/26/2019   Glucosuria  06/01/2018   Steroid-induced diabetes ([place]) 06/01/2018   Not well controlled moderate persistent asthma 04/29/2018   Gastroesophageal reflux disease 04/29/2018   Recurrent infections 04/29/2018   Dysuria 02/20/2018   Chronic rhinitis 04/03/2017   Recurrent falls 03/21/2017   Weakness 03/21/2017   Venous insufficiency 03/21/2017   OSA on CPAP 09/30/2016   Excessive daytime sleepiness 03/05/2016   Insomnia 10/04/2015   Bronchitis 05/05/2015   Abnormality of gait 03/22/2015   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   Cough variant asthma vs UACS/ vcd  03/15/2015   Pneumonia 02/14/2015   Autism spectrum disorder 02/14/2015   Nausea with vomiting 02/14/2015   Obesity, morbid ([place]) 12/13/2014   Mental retardation, 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

## 2021-01-23 ENCOUNTER — Encounter: Payer: Self-pay | Admitting: Physical Therapy

## 2021-01-23 ENCOUNTER — Ambulatory Visit (INDEPENDENT_AMBULATORY_CARE_PROVIDER_SITE_OTHER): Payer: Medicare Other | Admitting: Allergy and Immunology

## 2021-01-23 ENCOUNTER — Other Ambulatory Visit: Payer: Self-pay | Admitting: Allergy and Immunology

## 2021-01-23 VITALS — BP 110/60 | HR 112 | Temp 98.5°F | Resp 20 | Ht <= 58 in | Wt 163.4 lb

## 2021-01-23 DIAGNOSIS — K219 Gastro-esophageal reflux disease without esophagitis: Secondary | ICD-10-CM

## 2021-01-23 DIAGNOSIS — J3089 Other allergic rhinitis: Secondary | ICD-10-CM

## 2021-01-23 DIAGNOSIS — J4550 Severe persistent asthma, uncomplicated: Secondary | ICD-10-CM

## 2021-01-23 MED ORDER — IPRATROPIUM-ALBUTEROL 0.5-2.5 (3) MG/3ML IN SOLN
3.0000 mL | RESPIRATORY_TRACT | 2 refills | Status: DC | PRN
Start: 2021-01-23 — End: 2021-10-09

## 2021-01-23 MED ORDER — FAMOTIDINE 20 MG PO TABS: ORAL_TABLET | ORAL | 3 refills | Status: DC

## 2021-01-23 MED ORDER — CETIRIZINE HCL 10 MG PO TABS: 10.0000 mg | ORAL_TABLET | Freq: Two times a day (BID) | ORAL | 5 refills | Status: DC | PRN

## 2021-01-23 MED ORDER — ALBUTEROL SULFATE (2.5 MG/3ML) 0.083% IN NEBU: 2.5000 mg | INHALATION_SOLUTION | RESPIRATORY_TRACT | 0 refills | Status: DC | PRN

## 2021-01-23 MED ORDER — FORMOTEROL FUMARATE 20 MCG/2ML IN NEBU: INHALATION_SOLUTION | RESPIRATORY_TRACT | 5 refills | Status: DC

## 2021-01-23 MED ORDER — MONTELUKAST SODIUM 10 MG PO TABS: ORAL_TABLET | ORAL | 5 refills | Status: DC

## 2021-01-23 MED ORDER — BUDESONIDE 1 MG/2ML IN SUSP
RESPIRATORY_TRACT | 3 refills | Status: DC
Start: 2021-01-23 — End: 2021-09-24

## 2021-01-23 MED ORDER — OLOPATADINE HCL 0.2 % OP SOLN: OPHTHALMIC | 5 refills | Status: DC

## 2021-01-23 MED ORDER — IPRATROPIUM-ALBUTEROL 0.5-2.5 (3) MG/3ML IN SOLN
3.0000 mL | RESPIRATORY_TRACT | 5 refills | Status: DC | PRN
Start: 2021-01-23 — End: 2021-01-23

## 2021-01-23 MED ORDER — ALBUTEROL SULFATE HFA 108 (90 BASE) MCG/ACT IN AERS: 2.0000 | INHALATION_SPRAY | Freq: Four times a day (QID) | RESPIRATORY_TRACT | 1 refills | Status: DC | PRN

## 2021-01-23 NOTE — Progress Notes (Signed)
[place] - [place] - [place] - [place] - [place]   Follow-up Note  Referring Provider: Scifres, Nicole Cella, PA-C Primary Provider: Karlyne Greenspan Date of Office Visit: 01/23/2021  Subjective:   Lori Miles (DOB: August 31, 1995) is a 25 y.o. female who [facility] in re-evaluation of the following:  HPI: Lori Miles returns to this clinic in evaluation of asthma, allergic rhinitis, and reflux.  I last saw in this clinic on 23 October 2020.  Lori Miles is here today with Lori Miles mom who provides all of Lori Miles history.  Apparently Lori Miles has been okay with Lori Miles respiratory tract and has not required a systemic steroid or an antibiotic for a respiratory tract issue.  Lori Miles still uses a short acting bronchodilator on what sounds like a daily basis but overall Lori Miles has not had a lot of respiratory tract symptoms that have prevented Lori Miles from sleeping at night or going around the daytime and doing the things that need to be done.  Lori Miles does have some occasional sniffing and snorting and some nasal congestion and some occasional wheezing.  This occurs even though Lori Miles has been consistently using anti-inflammatory agents for both Lori Miles upper and lower airway.  Lori Miles is somewhat intolerant of using nasal steroids for Lori Miles develops issues with epistaxis.  Lori Miles has had very little problems with reflux or Lori Miles throat while continuing to use famotidine.  Lori Miles is still having a rather significant issue with chronic urinary tract infections for which Lori Miles has been placed on Keflex on a daily basis and just finished a course of ciprofloxacin.  Lori Miles did not receive vaccinations for COVID or flu.  Allergies as of 01/23/2021       Reactions   Abilify [aripiprazole] Swelling, Palpitations   Seroquel [quetiapine Fumarate] Palpitations   Amoxicillin-pot Clavulanate Hives, Other (See Comments)   Has patient had a PCN reaction causing immediate rash, facial/tongue/throat swelling, SOB  or lightheadedness with hypotension: No Has patient had a PCN reaction causing severe rash involving mucus membranes or skin necrosis:    #  #  YES  #  #  Has patient had a PCN reaction that required hospitalization: No Has patient had a PCN reaction occurring within the last 10 years: No   Bactrim [sulfamethoxazole-trimethoprim] Other (See Comments)   Abdominal pain   Doxycycline Other (See Comments)   Stomach cramps   Macrobid [nitrofurantoin Macrocrystal] Other (See Comments)   Sharp pain/ given with Tylenol 325 mg   Keppra [levetiracetam] Other (See Comments)   Aggressive behavior         Medication List    acetaminophen 325 MG tablet Commonly known as: TYLENOL Take 2 tablets (650 mg total) by mouth every 6 (six) hours as needed for mild pain or moderate pain.   albuterol (2.5 MG/3ML) 0.083% nebulizer solution Commonly known as: PROVENTIL Take 3 mLs (2.5 mg total) by nebulization every 4 (four) hours as needed for wheezing or shortness of breath.   albuterol 108 (90 Base) MCG/ACT inhaler Commonly known as: VENTOLIN HFA Inhale 2 puffs into the lungs every 6 (six) hours as needed for wheezing or shortness of breath.   benzonatate 100 MG capsule Commonly known as: TESSALON Take 1 capsule (100 mg total) by mouth every 8 (eight) hours.   budesonide 1 MG/2ML nebulizer solution Commonly known as: PULMICORT USE 1 VIAL VIA NEBULIZER FOUR TIMES DAILY DURING RESPITORY INFECTIONS AND ASTHMA FLARES FOR 1-2 WEEKS   cephALEXin 500 MG capsule Commonly known  as: KEFLEX Take 500 mg by mouth 3 (three) times daily.   cetirizine 10 MG tablet Commonly known as: ZYRTEC TAKE 1 TABLET(10 MG) BY MOUTH DAILY   ciprofloxacin 500 MG tablet Commonly known as: CIPRO Take 500 mg by mouth 2 (two) times daily.   clotrimazole-betamethasone cream Commonly known as: LOTRISONE Apply to affected area 2 times daily prn   Depakote 500 MG DR tablet Generic drug: divalproex TAKE 1 TABLET BY MOUTH  IN THE MORNING AND 2 TABLETS AT NIGHT   estrogens (conjugated) 1.25 MG tablet Commonly known as: Premarin TAKE ONE BY MOUTH TWICE PER DAY   famotidine 20 MG tablet Commonly known as: PEPCID TAKE 1 TABLET(20 MG) BY MOUTH EVERY 12 HOURS   FeroSul 325 (65 FE) MG tablet Generic drug: ferrous sulfate Take 325 mg by mouth daily.   fluconazole 150 MG tablet Commonly known as: DIFLUCAN Take 1 tablet (150 mg total) by mouth as needed (when on antibiotics).   fluticasone 50 MCG/ACT nasal spray Commonly known as: FLONASE Place 2 sprays into both nostrils daily.   formoterol 20 MCG/2ML nebulizer solution Commonly known as: Perforomist USE 1 VIAL VIA NEBULIZER TWICE DAILY   hydrOXYzine 25 MG tablet Commonly known as: ATARAX/VISTARIL Take 25 mg by mouth 3 (three) times daily as needed (pain).   ipratropium-albuterol 0.5-2.5 (3) MG/3ML Soln Commonly known as: DUONEB Take 3 mLs by nebulization every 4 (four) hours as needed.   melatonin 3 MG Tabs tablet Take 2 tablets (6mg ) by mouth at bedtime.   mometasone 50 MCG/ACT nasal spray Commonly known as: NASONEX Use one spray in each nostril once daily as directed.   montelukast 10 MG tablet Commonly known as: SINGULAIR TAKE 1 TABLET(10 MG) BY MOUTH AT BEDTIME   Nebulizer Misc Adult mask with tubing. Use as directed with nebulizer device.   nystatin powder Commonly known as: MYCOSTATIN/NYSTOP Apply 1 application topically 3 (three) times daily.   Olopatadine HCl 0.2 % Soln Can use one drop in each eye once daily if needed for red, itching, watery eyes.   ondansetron 4 MG disintegrating tablet Commonly known as: Zofran ODT Take 2 tablets (8 mg total) by mouth every 8 (eight) hours as needed for nausea or vomiting.   polyethylene glycol powder 17 GM/SCOOP powder Commonly known as: GLYCOLAX/MIRALAX DISSOLVE 1 CAPFUL(17 GRAMS) IN AT LEAST 8 OUNCES OF WATER/JUICE TWICE DAILY   Seasonique 0.15-0.03 &0.01 MG tablet Generic drug:  Levonorgestrel-Ethinyl Estradiol Take 1 tablet by mouth daily.   senna 8.6 MG tablet Commonly known as: SENOKOT Take 1 tablet by mouth at bedtime.   terconazole 0.4 % vaginal cream Commonly known as: TERAZOL 7 Place 1 applicator vaginally at bedtime.   terconazole 0.8 % vaginal cream Commonly known as: TERAZOL 3 Place 1 applicator vaginally at bedtime. Apply nightly for three nights.   traZODone 50 MG tablet Commonly known as: DESYREL TAKE 2 TABLETS(100 MG) BY MOUTH AT BEDTIME   Vitamin D3 1.25 MG (50000 UT) Caps Take 1 capsule by mouth once a week.    Past Medical History:  Diagnosis Date   Asthma    Autism    mom states no actual dx, but admits Lori Miles has some symptoms   Bacteriuria    Chronic constipation    Cognitive developmental delay    Constipation    COVID-19 03/2020   Disruptive behavior disorder    Dyspnea    if Lori Miles walks much   Family history of adverse reaction to anesthesia     "  hives"-? if anesthesia did take antibiotic   Frequency-urgency syndrome    Gait disorder    ORGANIC PER NERUOLGIST NOTE (DR HICKLING)   Generalized convulsive epilepsy (HCC) NEUROLOGIST-  DR HICKLING   GERD (gastroesophageal reflux disease)    Gout    H/O sinus tachycardia    History of acute respiratory failure 02/14/2015   SECONDARY TO CAP   History of recurrent UTIs    Incontinent of feces    Interstitial cystitis    Intertrigo 11/29/2019   Moderate intellectual disability    OSA (obstructive sleep apnea)    no machine yet    Partial epilepsy with impairment of consciousness (HCC)    FOLLOWED BY DR HICKLING   Pneumonia 2    4 times this year ( 2020)   PONV (postoperative nausea and vomiting)    Renal cyst 11/29/2019   Seizure (HCC)    most recent sz " at the end of summer"-last sz years ago per mother   Urinary incontinence     Past Surgical History:  Procedure Laterality Date   CYSTOSCOPY N/A 06/03/2017   Procedure: CYSTOSCOPY WITH EXAM UNDER ANESTHESIA;   Surgeon: Jerilee Field, MD;  Location: [facility];  Service: Urology;  Laterality: N/A;   CYSTOSCOPY  2020   CYSTOSCOPY WITH HYDRODISTENSION AND BIOPSY  04/14/2012   Procedure: CYSTOSCOPY/BIOPSY/HYDRODISTENSION;  Surgeon: Lindaann Slough, MD;  Location: [facility];  Service: Urology;;  instillation of marcaine and pyridium   EUA/  PAP SMEAR/  BREAST EXAM  03-12-2016   dr Erin Fulling [facility]   EUA/ VAGINOSCOPY/ COLPOSCOPY FO THE HYMENAL RING  10-04-2009    dr Tamela Oddi  [facility]   IR RADIOLOGIST EVAL & MGMT  07/29/2019   IR RADIOLOGIST EVAL & MGMT  09/30/2019   IR SCLEROTHERAPY OF A FLUID COLLECTION  09/08/2019   RADIOLOGY WITH ANESTHESIA N/A 09/08/2019   Procedure: IR Sclerotherapy Treatment;  Surgeon: Radiologist, Medication, MD;  [facility];  Service: Radiology;  Laterality: N/A;   TONSILLECTOMY      Review of systems negative except as noted in HPI / PMHx or noted below:  Review of Systems  Constitutional: Negative.   HENT: Negative.    Eyes: Negative.   Respiratory: Negative.    Cardiovascular: Negative.   Gastrointestinal: Negative.   Genitourinary: Negative.   Musculoskeletal: Negative.   Skin: Negative.   Neurological: Negative.   Endo/Heme/Allergies: Negative.   Psychiatric/Behavioral: Negative.      Objective:   Vitals:   01/23/21 0843  BP: 110/60  Pulse: (!) 112  Resp: 20  Temp: 98.5 F (36.9 C)  SpO2: 97%   Height: 4' 9.5" (146.1 cm)  Weight: 163 lb 6.4 oz (74.1 kg)   Physical Exam Constitutional:      Appearance: Lori Miles is not diaphoretic.  HENT:     Head: Normocephalic.     Right Ear: Tympanic membrane, ear canal and external ear normal.     Left Ear: Tympanic membrane, ear canal and external ear normal.     Nose: Nose normal. No mucosal edema or rhinorrhea.     Mouth/Throat:     Pharynx: Uvula midline. No oropharyngeal exudate.  Eyes:     Conjunctiva/sclera: Conjunctivae normal.  Neck:     Thyroid: No thyromegaly.      Trachea: Trachea normal. No tracheal tenderness or tracheal deviation.  Cardiovascular:     Rate and Rhythm: Normal rate and regular rhythm.     Heart sounds: Normal heart sounds, S1 normal and  S2 normal. No murmur heard. Pulmonary:     Effort: No respiratory distress.     Breath sounds: Normal breath sounds. No stridor. No wheezing or rales.  Lymphadenopathy:     Head:     Right side of head: No tonsillar adenopathy.     Left side of head: No tonsillar adenopathy.     Cervical: No cervical adenopathy.  Skin:    Findings: No erythema or rash.     Nails: There is no clubbing.  Neurological:     Mental Status: Lori Miles is alert.    Diagnostics: none   Assessment and Plan:   1. Asthma, severe persistent, well-controlled   2. Other allergic rhinitis   3. Gastroesophageal reflux disease, unspecified whether esophagitis present     1. Continue a combination of the following:   A. budesonide 1 mg nebulized twice a day  B. Perforomist nebulized twice a day  C. Nasal saline / gel 2 times per day  D. montelukast 10 mg daily  E. cetirizine 10 mg daily  2. Continue to Treat reflux with a combination of the following:   A. Famotidine 40mg  twice a day  B. No caffeine or chocolate consumption   3. "Action plan" for asthma flare up:   A. increase budesonide to 4 times a day  B. Albuterol or DUONEB nebulization every 4-6 hours if needed  4. If needed:   A. Cetirizine 10 mg - 1 tablet 1 time per day  5. Return to clinic 12 weeks or earlier if problem  Lori Miles appears to be doing okay at this point in time on Lori Miles current therapy of anti-inflammatory medications for Lori Miles airway and therapy directed against reflux.  We will continue Lori Miles on the plan noted above and assuming Lori Miles does well with this plan I will see Lori Miles back in this clinic in 12 weeks or earlier if there is a problem.  [name], MD Allergy / Immunology [facility]

## 2021-01-23 NOTE — Patient Instructions (Addendum)
  1. Continue a combination of the following:   A. budesonide 1 mg nebulized twice a day  B. Perforomist nebulized twice a day  C. Nasal saline / gel 2 times per day  D. montelukast 10 mg daily  E. cetirizine 10 mg daily  2. Continue to Treat reflux with a combination of the following:   A. Famotidine 40mg  twice a day  B. No caffeine or chocolate consumption   3. "Action plan" for asthma flare up:   A. increase budesonide to 4 times a day  B. Albuterol or DUONEB nebulization every 4-6 hours if needed  4. If needed:   A. Cetirizine 10 mg - 1 tablet 1 time per day  5. Return to clinic 12 weeks or earlier if problem

## 2021-01-24 ENCOUNTER — Encounter: Payer: Self-pay | Admitting: Allergy and Immunology

## 2021-01-26 ENCOUNTER — Telehealth: Payer: Self-pay | Admitting: Obstetrics and Gynecology

## 2021-01-26 NOTE — Telephone Encounter (Signed)
Mom called requesting Lori Miles's Premarin prescription be increased in quantity.  She takes as directed twice a day but due to continuous bleeding she needs it daily.  Current prescription is for only 10 days worth of medication.   Mom is desperate for a solution to Lori Miles's bleeding.  Mom states Lori Miles is bleeding and passing clots. Mom has declined the recommended IUD.  She states Lori Miles will pull it out, she has done it before.  She is requesting a permanent or long term solution to Lori Miles's bleeding.   Lori Miles wears pulls-ups and they are not allotted an adequate supply to manage the bleeding.   Mom has requested a call back.

## 2021-01-27 DIAGNOSIS — R7303 Prediabetes: Secondary | ICD-10-CM | POA: Diagnosis not present

## 2021-01-27 DIAGNOSIS — R300 Dysuria: Secondary | ICD-10-CM | POA: Diagnosis not present

## 2021-01-27 DIAGNOSIS — E7800 Pure hypercholesterolemia, unspecified: Secondary | ICD-10-CM | POA: Diagnosis not present

## 2021-01-27 DIAGNOSIS — J0101 Acute recurrent maxillary sinusitis: Secondary | ICD-10-CM | POA: Diagnosis not present

## 2021-01-27 DIAGNOSIS — R6250 Unspecified lack of expected normal physiological development in childhood: Secondary | ICD-10-CM | POA: Diagnosis not present

## 2021-01-27 DIAGNOSIS — R350 Frequency of micturition: Secondary | ICD-10-CM | POA: Diagnosis not present

## 2021-02-01 DIAGNOSIS — J4520 Mild intermittent asthma, uncomplicated: Secondary | ICD-10-CM | POA: Diagnosis not present

## 2021-02-02 ENCOUNTER — Other Ambulatory Visit (INDEPENDENT_AMBULATORY_CARE_PROVIDER_SITE_OTHER): Payer: Self-pay | Admitting: Family

## 2021-02-02 DIAGNOSIS — G4700 Insomnia, unspecified: Secondary | ICD-10-CM

## 2021-02-02 DIAGNOSIS — J4520 Mild intermittent asthma, uncomplicated: Secondary | ICD-10-CM | POA: Diagnosis not present

## 2021-02-02 MED ORDER — TRAZODONE HCL 50 MG PO TABS: ORAL_TABLET | ORAL | 5 refills | Status: DC

## 2021-02-04 DIAGNOSIS — N3001 Acute cystitis with hematuria: Secondary | ICD-10-CM | POA: Diagnosis not present

## 2021-02-04 DIAGNOSIS — J019 Acute sinusitis, unspecified: Secondary | ICD-10-CM | POA: Diagnosis not present

## 2021-02-05 ENCOUNTER — Other Ambulatory Visit: Payer: Self-pay | Admitting: Obstetrics

## 2021-02-05 ENCOUNTER — Encounter: Payer: Self-pay | Admitting: Physical Therapy

## 2021-02-05 ENCOUNTER — Other Ambulatory Visit: Payer: Self-pay

## 2021-02-05 ENCOUNTER — Ambulatory Visit: Payer: Medicare Other | Admitting: Physical Therapy

## 2021-02-05 ENCOUNTER — Telehealth: Payer: Self-pay | Admitting: Allergy and Immunology

## 2021-02-05 DIAGNOSIS — N939 Abnormal uterine and vaginal bleeding, unspecified: Secondary | ICD-10-CM

## 2021-02-05 DIAGNOSIS — R2689 Other abnormalities of gait and mobility: Secondary | ICD-10-CM | POA: Diagnosis not present

## 2021-02-05 DIAGNOSIS — F819 Developmental disorder of scholastic skills, unspecified: Secondary | ICD-10-CM | POA: Diagnosis not present

## 2021-02-05 DIAGNOSIS — M6281 Muscle weakness (generalized): Secondary | ICD-10-CM

## 2021-02-05 DIAGNOSIS — R269 Unspecified abnormalities of gait and mobility: Secondary | ICD-10-CM | POA: Diagnosis not present

## 2021-02-05 DIAGNOSIS — R2681 Unsteadiness on feet: Secondary | ICD-10-CM | POA: Diagnosis not present

## 2021-02-05 DIAGNOSIS — R296 Repeated falls: Secondary | ICD-10-CM | POA: Diagnosis not present

## 2021-02-05 MED ORDER — NORETHINDRONE ACETATE 5 MG PO TABS: 5.0000 mg | ORAL_TABLET | Freq: Every day | ORAL | 2 refills | Status: DC

## 2021-02-05 MED ORDER — NORETHINDRONE ACETATE 5 MG PO TABS: 10.0000 mg | ORAL_TABLET | Freq: Every day | ORAL | 2 refills | Status: DC

## 2021-02-05 NOTE — Telephone Encounter (Signed)
Patient's mother was not satisfied with Dr. Kathyrn Lass previous note. She stated that Dr. Lucie Leather know's the patient and so he should know that she needs the prednisone. She said that the 2 urgent care doctors told her to take it along with the amoxicillin. I did let her know at this time Dr. Lucie Leather thinks it needs to run its course for the 7-14 days. The patient's mother said that she does not have a viral respiratory infection its the same issue she has been having for years due to yeast infections and she wants to avoid her from having to be admitted into the hospital. She does not want the prednisone sent in by the urgent care doctor's, but from Dr. Lucie Leather.

## 2021-02-05 NOTE — Telephone Encounter (Signed)
Patient's mother does not want to bring her into bee seen at this time. She states she has been to the doctor's office twice and has already seen Dr. Lucie Leather not that long ago there is no point of them coming in for a visit.

## 2021-02-05 NOTE — Telephone Encounter (Signed)
Please inform Thomasa's mom that she probably has a viral respiratory tract infection of some type.  There are many different viral respiratory tract infections in the community right now.  Most of these viral infections will run 7 to 14 days at the most.  Amoxicillin usually does not help this issue unless of course there is a bacterial infection.  If she can do well without prednisone I would not give her any prednisone.  See what happens over the course of 7 to 14 days.  Hopefully she will start to improve within that timeframe.

## 2021-02-05 NOTE — Telephone Encounter (Signed)
Mom called in and states that she had to take Lori Miles to urgent care twice over the weekend.  Mom states Lori Miles's face is red, her eyes are puffy, nose is stuffy, and her face is swollen.  She was given Amoxicillin and prescribed prednisone.  Mom states she has not given the prednisone because she wants Dr. Kathyrn Miles opinion on whether she should take that or not.  Mom states Lori Miles isn't acting like her usual self and isn't playing with her dolls or anything.  Mom would like to know what to do.  Please advise.

## 2021-02-05 NOTE — Progress Notes (Signed)
Patient taking unopposed estrogen for AUB.  Norethindrone 5 mg daily started to prevent endometrial hyperplasia.  Brock Bad, MD 02/05/2021 1:14 PM

## 2021-02-06 ENCOUNTER — Encounter: Payer: Self-pay | Admitting: Physical Therapy

## 2021-02-06 NOTE — Therapy (Signed)
[facility] [address] [place], [place], [address] Phone: [phone]   Fax:  [phone]  Physical Therapy Treatment  Patient Details  Name: Lori Miles MRN: [id number] Date of Birth: March 14, 1996 Referring Provider (PT): Elveria Rising, NP   Encounter Date: 02/05/2021   PT End of Session - 02/06/21 1704     Visit Number 2    Number of Visits 7    Authorization Type UHC Medicare    Authorization Time Period 01-22-21 - 03-24-21    PT Start Time 0801    PT Stop Time 0845    PT Time Calculation (min) 44 min    Activity Tolerance Patient tolerated treatment well    Behavior During Therapy [facility] for tasks assessed/performed             Past Medical History:  Diagnosis Date   Asthma    Autism    mom states no actual dx, but admits she has some symptoms   Bacteriuria    Chronic constipation    Cognitive developmental delay    Constipation    COVID-19 03/2020   Disruptive behavior disorder    Dyspnea    if she walks much   Family history of adverse reaction to anesthesia     "hives"-? if anesthesia did take antibiotic   Frequency-urgency syndrome    Gait disorder    ORGANIC PER NERUOLGIST NOTE (DR HICKLING)   Generalized convulsive epilepsy (HCC) NEUROLOGIST-  DR HICKLING   GERD (gastroesophageal reflux disease)    Gout    H/O sinus tachycardia    History of acute respiratory failure 02/14/2015   SECONDARY TO CAP   History of recurrent UTIs    Incontinent of feces    Interstitial cystitis    Intertrigo 11/29/2019   Moderate intellectual disability    OSA (obstructive sleep apnea)    no machine yet    Partial epilepsy with impairment of consciousness (HCC)    FOLLOWED BY DR HICKLING   Pneumonia 2    4 times this year ( 2020)   PONV (postoperative nausea and vomiting)    Renal cyst 11/29/2019   Seizure (HCC)    most recent sz " at the end of summer"-last sz years ago per mother   Urinary  incontinence     Past Surgical History:  Procedure Laterality Date   CYSTOSCOPY N/A 06/03/2017   Procedure: CYSTOSCOPY WITH EXAM UNDER ANESTHESIA;  Surgeon: Jerilee Field, MD;  Location: [facility];  Service: Urology;  Laterality: N/A;   CYSTOSCOPY  2020   CYSTOSCOPY WITH HYDRODISTENSION AND BIOPSY  04/14/2012   Procedure: CYSTOSCOPY/BIOPSY/HYDRODISTENSION;  Surgeon: Lindaann Slough, MD;  Location: [facility];  Service: Urology;;  instillation of marcaine and pyridium   EUA/  PAP SMEAR/  BREAST EXAM  03-12-2016   dr Erin Fulling [facility]   EUA/ VAGINOSCOPY/ COLPOSCOPY FO THE HYMENAL RING  10-04-2009    dr Tamela Oddi  [facility]   IR RADIOLOGIST EVAL & MGMT  07/29/2019   IR RADIOLOGIST EVAL & MGMT  09/30/2019   IR SCLEROTHERAPY OF A FLUID COLLECTION  09/08/2019   RADIOLOGY WITH ANESTHESIA N/A 09/08/2019   Procedure: IR Sclerotherapy Treatment;  Surgeon: Radiologist, Medication, MD;  Location: [facility];  Service: Radiology;  Laterality: N/A;   TONSILLECTOMY      There were no vitals filed for this visit.  [facility] Adult PT Treatment/Exercise - 02/06/21 0001       Transfers   Transfers Sit to Stand;Stand to Sit    Sit to Stand 4: Min guard    Stand to Sit 5: Supervision    Number of Reps 10 reps    Comments feet on Airex; no UE support used on 2nd set of 5 reps      Ambulation/Gait   Ambulation/Gait Yes    Ambulation/Gait Assistance 5: Supervision   pt used HHA - to coerce pt to walk during session   Ambulation Distance (Feet) 350 Feet   3 laps   Assistive device None   HHA from PT (for security)   Stairs Yes    Stairs Assistance 5: Supervision    Stair Management Technique Two rails;Alternating pattern;Step to pattern   alternating with ascension; step to with descension   Number of Stairs 4    Height of Stairs 6      Neuro Re-ed    Neuro Re-ed Details  Pt performed cone taps to 4 cones with min HHA:  performed stepping on  various height stepping stones with min HHA x 2 reps for improved SLS      Exercises   Exercises Knee/Hip      Knee/Hip Exercises: Aerobic   Recumbent Bike SciFit level 2.0 x 5" with UE's & LE's      Knee/Hip Exercises: Standing   Forward Step Up Right;Left;1 set;10 reps    Other Standing Knee Exercises 2# weight on each leg - stepping over and back of balance beam 5 reps each leg with min assist      Knee/Hip Exercises: Seated   Long Arc Quad Strengthening;Right;Left;1 set;10 reps   2# weight                      PT Short Term Goals - 02/06/21 1709       PT SHORT TERM GOAL #1   Title Improve TUG score from 16.93 secs to </= 15 secs without device to reduce fall risk.    Baseline 16.93 secs without device    Time 3    Period Weeks    Status New    Target Date 02/16/21      PT SHORT TERM GOAL #2   Title Perform HEP for balance and strengthening with mother's assist.    Time 3    Period Weeks    Status New    Target Date 02/16/21               PT Long Term Goals - 02/06/21 1709       PT LONG TERM GOAL #1   Title Pt. will amb. 500' on flat, even surface without LOB.    Time 6    Period Weeks    Status New      PT LONG TERM GOAL #2   Title Pt will perform at least 10" on recumbent bike nonstop for improved endurance/activity tolerance.    Time 6    Period Weeks    Status New      PT LONG TERM GOAL #3   Title Improve TUG score from 16.93 to </= 13.5 secs without device.    Baseline 16.93 secs without device    Time 6    Period Weeks    Status New      PT LONG TERM GOAL #4   Title Improve 5x sit to stand score from 15.93  secs to </=  12 secs to demo improved LE strength.    Baseline 15.93 secs without UE support from high/low mat table    Time 6    Period Weeks    Status New                   Plan - 02/06/21 1704     Clinical Impression Statement Pt initially hesitant to participate in PT session but was persuaded to try  exercises and was able to complete the full session with frequent short rest breaks.  Pt able to perform balance activity of stepping on stepping stones for improved SLS with min HHA.  Cont with POC.    Personal Factors and Comorbidities Behavior Pattern;Comorbidity 1;Past/Current Experience;Fitness;Time since onset of injury/illness/exacerbation    Comorbidities autism, developmental delay, gait disorder, asthma    Examination-Activity Limitations Locomotion Level;Transfers;Squat;Stairs;Stand;Carry    Examination-Participation Restrictions Cleaning;Community Activity;Interpersonal Relationship;Laundry;Shop;Meal Prep    Stability/Clinical Decision Making Stable/Uncomplicated    Rehab Potential Fair    PT Frequency 1x / week    PT Duration 6 weeks    PT Treatment/Interventions ADLs/Self Care Home Management;Gait training;Stair training;Functional mobility training;Therapeutic activities;Therapeutic exercise;Balance training;Patient/family education;Neuromuscular re-education    PT Next Visit Plan LE strengthening & balance training    Consulted and Agree with Plan of Care Patient;Family member/caregiver    Family Member Consulted mother             Patient will benefit from skilled therapeutic intervention in order to improve the following deficits and impairments:  Abnormal gait, Decreased activity tolerance, Decreased balance, Decreased coordination, Decreased strength, Decreased endurance  Visit Diagnosis: Other abnormalities of gait and mobility  Muscle weakness (generalized)  Unsteadiness on feet     Problem List Patient Active Problem List   Diagnosis Date Noted   Encounter for long-term (current) use of high-risk medication 12/29/2020   Menorrhagia 12/16/2020   Renal cyst 11/29/2019   Intertrigo 11/29/2019   Complex care coordination 05/18/2019   Screening for cervical cancer 02/26/2019   Glucosuria 06/01/2018   Steroid-induced diabetes ([place]) 06/01/2018   Not well  controlled moderate persistent asthma 04/29/2018   Gastroesophageal reflux disease 04/29/2018   Recurrent infections 04/29/2018   Dysuria 02/20/2018   Chronic rhinitis 04/03/2017   Recurrent falls 03/21/2017   Weakness 03/21/2017   Venous insufficiency 03/21/2017   OSA on CPAP 09/30/2016   Excessive daytime sleepiness 03/05/2016   Insomnia 10/04/2015   Bronchitis 05/05/2015   Abnormality of gait 03/22/2015   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   Cough variant asthma vs UACS/ vcd  03/15/2015   Pneumonia 02/14/2015   Autism spectrum disorder 02/14/2015   Nausea with vomiting 02/14/2015   Obesity, morbid ([place]) 12/13/2014   Mental retardation, 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 [place], [place], [address] Phone: [phone]   Fax:  [phone]  Name: Lori Miles MRN: [id number] Date of Birth: Apr 18, 1995

## 2021-02-07 NOTE — Telephone Encounter (Signed)
Spoke to mom and she stated she had prednisone sent into the pharmacy from one of [facility] other doctors.  20mg  prednisone 10 tablets.

## 2021-02-07 NOTE — Telephone Encounter (Signed)
Mom called again. She said Lori Miles has green discharge from her nose and is congested. She cannot eat and does not feel good. She would like a call back by the end of the day.

## 2021-02-12 ENCOUNTER — Other Ambulatory Visit: Payer: Self-pay

## 2021-02-12 ENCOUNTER — Ambulatory Visit: Payer: Medicare Other | Admitting: Physical Therapy

## 2021-02-12 ENCOUNTER — Encounter: Payer: Self-pay | Admitting: Physical Therapy

## 2021-02-12 DIAGNOSIS — R296 Repeated falls: Secondary | ICD-10-CM | POA: Diagnosis not present

## 2021-02-12 DIAGNOSIS — R2681 Unsteadiness on feet: Secondary | ICD-10-CM

## 2021-02-12 DIAGNOSIS — R2689 Other abnormalities of gait and mobility: Secondary | ICD-10-CM

## 2021-02-12 DIAGNOSIS — M6281 Muscle weakness (generalized): Secondary | ICD-10-CM | POA: Diagnosis not present

## 2021-02-12 DIAGNOSIS — R269 Unspecified abnormalities of gait and mobility: Secondary | ICD-10-CM | POA: Diagnosis not present

## 2021-02-12 DIAGNOSIS — F819 Developmental disorder of scholastic skills, unspecified: Secondary | ICD-10-CM | POA: Diagnosis not present

## 2021-02-12 NOTE — Therapy (Signed)
[facility] [address] [place], [place], [address] Phone: [phone]   Fax:  [phone]  Physical Therapy Treatment  Patient Details  Name: Lori Miles MRN: [id number] Date of Birth: 1995-07-09 Referring Provider (PT): Rockwell Germany, NP   Encounter Date: 02/12/2021   PT End of Session - 02/12/21 1943     Visit Number 3    Number of Visits 7    Authorization Type UHC Medicare    Authorization Time Period 01-22-21 - 03-24-21    PT Start Time 0800    PT Stop Time 0845    PT Time Calculation (min) 45 min    Activity Tolerance Patient tolerated treatment well    Behavior During Therapy [facility] for tasks assessed/performed             Past Medical History:  Diagnosis Date   Asthma    Autism    mom states no actual dx, but admits she has some symptoms   Bacteriuria    Chronic constipation    Cognitive developmental delay    Constipation    COVID-19 03/2020   Disruptive behavior disorder    Dyspnea    if she walks much   Family history of adverse reaction to anesthesia     "hives"-? if anesthesia did take antibiotic   Frequency-urgency syndrome    Gait disorder    ORGANIC PER NERUOLGIST NOTE (DR HICKLING)   Generalized convulsive epilepsy ([place]) NEUROLOGIST-  DR HICKLING   GERD (gastroesophageal reflux disease)    Gout    H/O sinus tachycardia    History of acute respiratory failure 02/14/2015   SECONDARY TO CAP   History of recurrent UTIs    Incontinent of feces    Interstitial cystitis    Intertrigo 11/29/2019   Moderate intellectual disability    OSA (obstructive sleep apnea)    no machine yet    Partial epilepsy with impairment of consciousness ([place])    FOLLOWED BY DR HICKLING   Pneumonia 2    4 times this year ( 2020)   PONV (postoperative nausea and vomiting)    Renal cyst 11/29/2019   Seizure ([place])    most recent sz " at the end of summer"-last sz years ago per mother   Urinary  incontinence     Past Surgical History:  Procedure Laterality Date   CYSTOSCOPY N/A 06/03/2017   Procedure: CYSTOSCOPY WITH EXAM UNDER ANESTHESIA;  Surgeon: Festus Aloe, MD;  Location: [facility];  Service: Urology;  Laterality: N/A;   CYSTOSCOPY  2020   CYSTOSCOPY WITH HYDRODISTENSION AND BIOPSY  04/14/2012   Procedure: CYSTOSCOPY/BIOPSY/HYDRODISTENSION;  Surgeon: Hanley Ben, MD;  Location: [facility];  Service: Urology;;  instillation of marcaine and pyridium   EUA/  PAP SMEAR/  BREAST EXAM  03-12-2016   dr Ihor Dow [facility]   EUA/ VAGINOSCOPY/ COLPOSCOPY FO THE HYMENAL RING  10-04-2009    dr Delsa Sale  [place] & MGMT  07/29/2019   IR RADIOLOGIST EVAL & MGMT  09/30/2019   IR SCLEROTHERAPY OF A FLUID COLLECTION  09/08/2019   RADIOLOGY WITH ANESTHESIA N/A 09/08/2019   Procedure: IR Sclerotherapy Treatment;  Surgeon: Radiologist, Medication, MD;  Location: [facility];  Service: Radiology;  Laterality: N/A;   TONSILLECTOMY      There were no vitals filed for this visit.   Subjective Assessment - 02/12/21 0800     Subjective [name] reports pt's UTI is better -mobility  is about the same; mother states she is still bumping into things at home at times    Currently in Pain? No/denies                               [facility] Adult PT Treatment/Exercise - 02/12/21 0001       Transfers   Transfers Sit to Stand;Stand to Sit    Sit to Stand 4: Min guard    Stand to Sit 4: Min guard    Number of Reps Other reps (comment)   5 reps   Comments feet on Airex; no UE support used on 2nd set of 5 reps      Ambulation/Gait   Ambulation/Gait Yes    Ambulation/Gait Assistance 5: Supervision    Ambulation Distance (Feet) 200 Feet   200' x 2 reps - rest break between reps   Assistive device None    Stairs Yes    Stairs Assistance 5: Supervision    Stair Management Technique Two rails;Alternating pattern;Step to pattern    alternating with ascension; step to with descension   Number of Stairs 4    Height of Stairs 6      Knee/Hip Exercises: Aerobic   Recumbent Bike SciFit level 2.0 x 5" with UE's & LE's      Knee/Hip Exercises: Standing   Hip Flexion Stengthening;Right;Left;1 set;10 reps;Knee straight   2# weight - cues to perform slowly and hold for 2 secs for strengthening   Hip Abduction Stengthening;Right;Left;1 set;10 reps;Knee straight   2# weight   Hip Extension Stengthening;Right;Left;1 set;10 reps   2# weight   Forward Step Up Right;Left;1 set;10 reps      Knee/Hip Exercises: Seated   Long Arc Quad Strengthening;Right;Left;1 set;10 reps   2# weight                Balance Exercises - 02/12/21 0001       Balance Exercises: Standing   Rockerboard Anterior/posterior;EO;Other reps (comment);UE support   20 reps - EO   Marching Solid surface;Static;10 reps   with min HHA   Other Standing Exercises Pt performed touching colored discs (4 total) with each foot with min HHA for improved coordination and SLS; performed stepping activity on stepping stones (6) of various heights for improved SLS on each leg                  PT Short Term Goals - 02/12/21 1950       PT SHORT TERM GOAL #1   Title Improve TUG score from 16.93 secs to </= 15 secs without device to reduce fall risk.    Baseline 16.93 secs without device    Time 3    Period Weeks    Status New    Target Date 02/16/21      PT SHORT TERM GOAL #2   Title Perform HEP for balance and strengthening with mother's assist.    Time 3    Period Weeks    Status New    Target Date 02/16/21               PT Long Term Goals - 02/12/21 1951       PT LONG TERM GOAL #1   Title Pt. will amb. 500' on flat, even surface without LOB.    Time 6    Period Weeks    Status New      PT LONG TERM GOAL #  2   Title Pt will perform at least 10" on recumbent bike nonstop for improved endurance/activity tolerance.    Time 6     Period Weeks    Status New      PT LONG TERM GOAL #3   Title Improve TUG score from 16.93 to </= 13.5 secs without device.    Baseline 16.93 secs without device    Time 6    Period Weeks    Status New      PT LONG TERM GOAL #4   Title Improve 5x sit to stand score from 15.93  secs to </= 12 secs to demo improved LE strength.    Baseline 15.93 secs without UE support from high/low mat table    Time 6    Period Weeks    Status New                   Plan - 02/12/21 1944     Clinical Impression Statement Pt needed coercion to fully participate in some of the strengthening activities and reported pain in both legs during exercise, but this discomfort was relieved with seated rest period.  Pt appears to c/o discomfort in LE's to avoid full participation in exercises but able to resume with rest period and persuasion.  Pt did well with balance exercises with only min HHA needed for security and to reduce fear of falling.    Personal Factors and Comorbidities Behavior Pattern;Comorbidity 1;Past/Current Experience;Fitness;Time since onset of injury/illness/exacerbation    Comorbidities autism, developmental delay, gait disorder, asthma    Examination-Activity Limitations Locomotion Level;Transfers;Squat;Stairs;Stand;Carry    Examination-Participation Restrictions Cleaning;Community Activity;Interpersonal Relationship;Laundry;Shop;Meal Prep    Stability/Clinical Decision Making Stable/Uncomplicated    Rehab Potential Fair    PT Frequency 1x / week    PT Duration 6 weeks    PT Treatment/Interventions ADLs/Self Care Home Management;Gait training;Stair training;Functional mobility training;Therapeutic activities;Therapeutic exercise;Balance training;Patient/family education;Neuromuscular re-education    PT Next Visit Plan check STG's - LE strengthening & balance training    Consulted and Agree with Plan of Care Patient;Family member/caregiver    Family Member Consulted mother              Patient will benefit from skilled therapeutic intervention in order to improve the following deficits and impairments:  Abnormal gait, Decreased activity tolerance, Decreased balance, Decreased coordination, Decreased strength, Decreased endurance  Visit Diagnosis: Other abnormalities of gait and mobility  Muscle weakness (generalized)  Unsteadiness on feet     Problem List Patient Active Problem List   Diagnosis Date Noted   Encounter for long-term (current) use of high-risk medication 12/29/2020   Menorrhagia 12/16/2020   Renal cyst 11/29/2019   Intertrigo 11/29/2019   Complex care coordination 05/18/2019   Screening for cervical cancer 02/26/2019   Glucosuria 06/01/2018   Steroid-induced diabetes ([place]) 06/01/2018   Not well controlled moderate persistent asthma 04/29/2018   Gastroesophageal reflux disease 04/29/2018   Recurrent infections 04/29/2018   Dysuria 02/20/2018   Chronic rhinitis 04/03/2017   Recurrent falls 03/21/2017   Weakness 03/21/2017   Venous insufficiency 03/21/2017   OSA on CPAP 09/30/2016   Excessive daytime sleepiness 03/05/2016   Insomnia 10/04/2015   Bronchitis 05/05/2015   Abnormality of gait 03/22/2015   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   Cough variant asthma vs UACS/ vcd  03/15/2015   Pneumonia 02/14/2015   Autism spectrum disorder 02/14/2015   Nausea with vomiting 02/14/2015   Obesity, morbid ([place]) 12/13/2014   Mental retardation, 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

## 2021-02-13 ENCOUNTER — Telehealth (INDEPENDENT_AMBULATORY_CARE_PROVIDER_SITE_OTHER): Payer: Medicare Other | Admitting: Obstetrics and Gynecology

## 2021-02-13 ENCOUNTER — Encounter: Payer: Self-pay | Admitting: Obstetrics and Gynecology

## 2021-02-13 DIAGNOSIS — N920 Excessive and frequent menstruation with regular cycle: Secondary | ICD-10-CM

## 2021-02-13 DIAGNOSIS — Z760 Encounter for issue of repeat prescription: Secondary | ICD-10-CM

## 2021-02-13 NOTE — Progress Notes (Signed)
GYNECOLOGY VIRTUAL VISIT ENCOUNTER NOTE  [facility] location: [facility]   Patient location: Home  I connected with Lori Miles mother on 02/13/21 at  8:15 AM EST by MyChart Video Encounter and verified that I am speaking with the correct person using two identifiers.   I discussed the limitations, risks, security and privacy concerns of performing an evaluation and management service virtually and the availability of in person appointments. I also discussed with the patient that there may be a patient responsible charge related to this service. The patient expressed understanding and agreed to proceed.   History:  Lori Miles is a 25 y.o. P0 female being evaluated today for medication management. The patient's mother denies any abnormal vaginal discharge, pelvic pain or other concerns. Patient is under our care for the management of menorrhagia with regular cycle. She is currently well managed with Seasonique and will at time supplement with premarin on first few days of menstrual cycle if flow is excessive. Based on the number of refills requested, it appears that premarin is being used far too often. During a previous visit, Mirena IUD was discussed as an option to manage her menorrhagia. The patient's mother seemed receptive to that but now states that the patient will pull the IUD. Patient's mother desires her to have a hysterectomy       Past Medical History:  Diagnosis Date   Asthma    Autism    mom states no actual dx, but admits she has some symptoms   Bacteriuria    Chronic constipation    Cognitive developmental delay    Constipation    COVID-19 03/2020   Disruptive behavior disorder    Dyspnea    if she walks much   Family history of adverse reaction to anesthesia     "hives"-? if anesthesia did take antibiotic   Frequency-urgency syndrome    Gait disorder    ORGANIC PER NERUOLGIST NOTE (DR HICKLING)   Generalized convulsive  epilepsy (HCC) NEUROLOGIST-  DR HICKLING   GERD (gastroesophageal reflux disease)    Gout    H/O sinus tachycardia    History of acute respiratory failure 02/14/2015   SECONDARY TO CAP   History of recurrent UTIs    Incontinent of feces    Interstitial cystitis    Intertrigo 11/29/2019   Moderate intellectual disability    OSA (obstructive sleep apnea)    no machine yet    Partial epilepsy with impairment of consciousness (HCC)    FOLLOWED BY DR HICKLING   Pneumonia 2    4 times this year ( 2020)   PONV (postoperative nausea and vomiting)    Renal cyst 11/29/2019   Seizure (HCC)    most recent sz " at the end of summer"-last sz years ago per mother   Urinary incontinence    Past Surgical History:  Procedure Laterality Date   CYSTOSCOPY N/A 06/03/2017   Procedure: CYSTOSCOPY WITH EXAM UNDER ANESTHESIA;  Surgeon: Jerilee Field, MD;  [facility];  Service: Urology;  Laterality: N/A;   CYSTOSCOPY  2020   CYSTOSCOPY WITH HYDRODISTENSION AND BIOPSY  04/14/2012   Procedure: CYSTOSCOPY/BIOPSY/HYDRODISTENSION;  Surgeon: Lindaann Slough, MD;  Location: [facility];  Service: Urology;;  instillation of marcaine and pyridium   EUA/  PAP SMEAR/  BREAST EXAM  03-12-2016   dr Erin Fulling [facility]   EUA/ VAGINOSCOPY/ COLPOSCOPY FO THE HYMENAL RING  10-04-2009    dr Tamela Oddi  [facility]   IR RADIOLOGIST EVAL & MGMT  07/29/2019   IR RADIOLOGIST EVAL & MGMT  09/30/2019   IR SCLEROTHERAPY OF A FLUID COLLECTION  09/08/2019   RADIOLOGY WITH ANESTHESIA N/A 09/08/2019   Procedure: IR Sclerotherapy Treatment;  Surgeon: Radiologist, Medication, MD;  Location: [facility];  Service: Radiology;  Laterality: N/A;   TONSILLECTOMY     The following portions of the patient's history were reviewed and updated as appropriate: allergies, current medications, past family history, past medical history, past social history, past surgical history and problem list.   Health Maintenance:   Normal pap and negative HRHPV on 02/2019.    Review of Systems:  Pertinent items noted in HPI and remainder of comprehensive ROS otherwise negative.  Physical Exam:   General:  Alert, oriented and cooperative. Patient appears to be in no acute distress.  Mental Status: Normal mood and affect. Normal behavior. Normal judgment and thought content.   Respiratory: Normal respiratory effort, no problems with respiration noted  Rest of physical exam deferred due to type of encounter  Labs and Imaging No results found. However, due to the size of the patient record, not all encounters were searched. Please check Results Review for a complete set of results. No results found.     Assessment and Plan:     1. Encounter for medication refill - Informed patient that refill on premarin is inappropriate at this time give the excessive usage and concern for endometrial hyperplasia/malignancy - Discussed placement of IUD under anesthesia and keeping the strings very short making it impossible for the patient to remove the IUD herself. She declined that option - Patient will have to return in 3 months or at her convenience for vaginal hysterectomy consultation per patient's mother request. Informed mother that patient will have to be present in order for a physical exam to be performed and assessment of her pelvis. Patient's mother feels that bringing her daughter to the office would trigger behavioral issues in her daughter. She truly wishes not to discuss any medical decisions or surgery in the presence of her daughter       I discussed the assessment and treatment plan with the patient. The patient was provided an opportunity to ask questions and all were answered. The patient agreed with the plan and demonstrated an understanding of the instructions.   The patient was advised to call back or seek an in-person evaluation/go to the ED if the symptoms worsen or if the condition fails to improve as  anticipated.  I provided 20 minutes of face-to-face time during this encounter.   Lori Antigua, MD [facility] [employer], [facility]

## 2021-02-16 ENCOUNTER — Other Ambulatory Visit: Payer: Self-pay | Admitting: Obstetrics and Gynecology

## 2021-02-18 DIAGNOSIS — R0981 Nasal congestion: Secondary | ICD-10-CM | POA: Diagnosis not present

## 2021-02-18 DIAGNOSIS — H6503 Acute serous otitis media, bilateral: Secondary | ICD-10-CM | POA: Diagnosis not present

## 2021-02-18 DIAGNOSIS — J0100 Acute maxillary sinusitis, unspecified: Secondary | ICD-10-CM | POA: Diagnosis not present

## 2021-02-19 ENCOUNTER — Ambulatory Visit: Payer: Medicare Other | Attending: Physician Assistant | Admitting: Physical Therapy

## 2021-02-19 ENCOUNTER — Other Ambulatory Visit: Payer: Self-pay

## 2021-02-19 VITALS — BP 119/69 | HR 90

## 2021-02-19 DIAGNOSIS — R2681 Unsteadiness on feet: Secondary | ICD-10-CM | POA: Diagnosis not present

## 2021-02-19 DIAGNOSIS — R2689 Other abnormalities of gait and mobility: Secondary | ICD-10-CM | POA: Insufficient documentation

## 2021-02-19 DIAGNOSIS — M6281 Muscle weakness (generalized): Secondary | ICD-10-CM | POA: Diagnosis not present

## 2021-02-20 ENCOUNTER — Encounter: Payer: Self-pay | Admitting: Physical Therapy

## 2021-02-20 NOTE — Addendum Note (Signed)
Addended by: Robet Leu A on: 02/20/2021 09:53 AM   Modules accepted: Orders

## 2021-02-20 NOTE — Telephone Encounter (Signed)
Patients mom called stating Lori Miles needs a refill on her Flonase. She is still having sinus/nasal issues. Mom states she is also using the nasal saline to help as the patient has a sore in her nose that is hurting her. Mom is wondering if there is a cream/ointment she can put in her nose to help with this sore? The saline nasal gel isn't helping. She mentioned an ointment/medicine that started with a M that could help with the sore but unsure of the name.  Please Advise  [facility] [place]

## 2021-02-20 NOTE — Therapy (Signed)
[facility] [address] [place], [place], [address] Phone: [phone]   Fax:  [phone]  Physical Therapy Treatment  Patient Details  Name: Lori Miles MRN: [id number] Date of Birth: [birth year] Referring Provider (PT): Rockwell Germany, NP   Encounter Date: 02/19/2021   PT End of Session - 02/20/21 1918     Visit Number 4    Number of Visits 7    Date for PT Re-Evaluation 03/09/21    Authorization Type [employer] Medicare    Authorization Time Period 01-22-21 - 03-24-21    PT Start Time 0801    PT Stop Time 0845    PT Time Calculation (min) 44 min    Activity Tolerance Patient tolerated treatment well    Behavior During Therapy [facility] for tasks assessed/performed             Past Medical History:  Diagnosis Date   Asthma    Autism    mom states no actual dx, but admits she has some symptoms   Bacteriuria    Chronic constipation    Cognitive developmental delay    Constipation    COVID-19 03/2020   Disruptive behavior disorder    Dyspnea    if she walks much   Family history of adverse reaction to anesthesia     "hives"-? if anesthesia did take antibiotic   Frequency-urgency syndrome    Gait disorder    ORGANIC PER NERUOLGIST NOTE (DR HICKLING)   Generalized convulsive epilepsy ([place]) NEUROLOGIST-  DR HICKLING   GERD (gastroesophageal reflux disease)    Gout    H/O sinus tachycardia    History of acute respiratory failure 02/14/2015   SECONDARY TO CAP   History of recurrent UTIs    Incontinent of feces    Interstitial cystitis    Intertrigo 11/29/2019   Moderate intellectual disability    OSA (obstructive sleep apnea)    no machine yet    Partial epilepsy with impairment of consciousness ([place])    FOLLOWED BY DR HICKLING   Pneumonia 2    4 times this year ( 2020)   PONV (postoperative nausea and vomiting)    Renal cyst 11/29/2019   Seizure ([place])    most recent sz " at the end of summer"-last sz  years ago per mother   Urinary incontinence     Past Surgical History:  Procedure Laterality Date   CYSTOSCOPY N/A 06/03/2017   Procedure: CYSTOSCOPY WITH EXAM UNDER ANESTHESIA;  Surgeon: Festus Aloe, MD;  Location: [facility];  Service: Urology;  Laterality: N/A;   CYSTOSCOPY  2020   CYSTOSCOPY WITH HYDRODISTENSION AND BIOPSY  04/14/2012   Procedure: CYSTOSCOPY/BIOPSY/HYDRODISTENSION;  Surgeon: Hanley Ben, MD;  Location: [place];  Service: Urology;;  instillation of marcaine and pyridium   EUA/  PAP SMEAR/  BREAST EXAM  03-12-2016   dr Ihor Dow [facility]   EUA/ VAGINOSCOPY/ COLPOSCOPY FO THE HYMENAL RING  10-04-2009    dr Delsa Sale  [place] & MGMT  07/29/2019   IR RADIOLOGIST EVAL & MGMT  09/30/2019   IR SCLEROTHERAPY OF A FLUID COLLECTION  09/08/2019   RADIOLOGY WITH ANESTHESIA N/A 09/08/2019   Procedure: IR Sclerotherapy Treatment;  Surgeon: Radiologist, Medication, MD;  Location: [facility];  Service: Radiology;  Laterality: N/A;   TONSILLECTOMY      Vitals:   02/19/21 0808  BP: 119/69  Pulse: 90     Subjective Assessment - 02/20/21 1420  Subjective [name] states pt is now on predisone - says the tissues/membranes in her nose are inflamed and are "in a ball" in her nose    Currently in Pain? No/denies                               [facility] Adult PT Treatment/Exercise - 02/20/21 0001       Transfers   Transfers Sit to Stand;Stand to Sit    Sit to Stand 4: Min guard    Stand to Sit 4: Min guard    Number of Reps 10 reps    Comments feet on floor 5 reps; 5 reps feet on Airex      Ambulation/Gait   Ambulation/Gait Yes    Ambulation/Gait Assistance 5: Supervision    Ambulation Distance (Feet) 400 Feet    Assistive device None    Stairs Yes    Stairs Assistance 5: Supervision    Stair Management Technique Two rails;Alternating pattern;Step to pattern    Number of Stairs 4    Height of  Stairs 6    Gait Comments 200' x 2 reps      Therapeutic Activites    Therapeutic Activities Other Therapeutic Activities    Other Therapeutic Activities pt performed jumping with UE suppport on mini trampoline - approx. 5 jumps      Neuro Re-ed    Neuro Re-ed Details  Pt performed ladder negotiation on floor using a step over step sequence with min HHA and also performed stepping on stepping stones of various heights with min HHA to improve SLS on each leg      Exercises   Exercises Knee/Hip      Knee/Hip Exercises: Aerobic   Recumbent Bike SciFit level 2.5 x 5" with UE's & LE's      Knee/Hip Exercises: Machines for Strengthening   Cybex Leg Press bil. LE's  50# 3 sets 10 reps      Knee/Hip Exercises: Standing   Hip Flexion Stengthening;Right;Left;1 set;10 reps;Knee straight   2# weight - cues to perform slowly and hold for 2 secs for strengthening   Hip Abduction Stengthening;Right;Left;1 set;10 reps;Knee straight   2# weight   Hip Extension Stengthening;Right;Left;1 set;10 reps   2# weight   Forward Step Up Right;Left;1 set;10 reps                       PT Short Term Goals - 02/20/21 1919       PT SHORT TERM GOAL #1   Title Improve TUG score from 16.93 secs to </= 15 secs without device to reduce fall risk.    Baseline 16.93 secs without device;    Time 3    Period Weeks    Status New    Target Date 02/16/21      PT SHORT TERM GOAL #2   Title Perform HEP for balance and strengthening with mother's assist.    Baseline met 02-19-21    Time 3    Period Weeks    Status Achieved    Target Date 02/16/21               PT Long Term Goals - 02/20/21 1919       PT LONG TERM GOAL #1   Title Pt. will amb. 500' on flat, even surface without LOB.    Time 6    Period Weeks    Status New  PT LONG TERM GOAL #2   Title Pt will perform at least 10" on recumbent bike nonstop for improved endurance/activity tolerance.    Time 6    Period Weeks    Status  New      PT LONG TERM GOAL #3   Title Improve TUG score from 16.93 to </= 13.5 secs without device.    Baseline 16.93 secs without device    Time 6    Period Weeks    Status New      PT LONG TERM GOAL #4   Title Improve 5x sit to stand score from 15.93  secs to </= 12 secs to demo improved LE strength.    Baseline 15.93 secs without UE support from high/low mat table    Time 6    Period Weeks    Status New                   Plan - 02/20/21 1925     Clinical Impression Statement PT session focused on LE strengthening, balance and gait training with pt performing jumps on mini trampoline with UE support on bar for first time.  Pt able to perform stepping stone activity to facilitate improved SLS on each leg with min HHA due to fear of falling with this exercise.  Pt is progressing well - cont with POC.    Personal Factors and Comorbidities Behavior Pattern;Comorbidity 1;Past/Current Experience;Fitness;Time since onset of injury/illness/exacerbation    Comorbidities autism, developmental delay, gait disorder, asthma    Examination-Activity Limitations Locomotion Level;Transfers;Squat;Stairs;Stand;Carry    Examination-Participation Restrictions Cleaning;Community Activity;Interpersonal Relationship;Laundry;Shop;Meal Prep    Stability/Clinical Decision Making Stable/Uncomplicated    Rehab Potential Fair    PT Frequency 1x / week    PT Duration 6 weeks    PT Treatment/Interventions ADLs/Self Care Home Management;Gait training;Stair training;Functional mobility training;Therapeutic activities;Therapeutic exercise;Balance training;Patient/family education;Neuromuscular re-education    PT Next Visit Plan check STG #1 - cont LE strengthening & balance training    Consulted and Agree with Plan of Care Patient;Family member/caregiver    Family Member Consulted mother             Patient will benefit from skilled therapeutic intervention in order to improve the following deficits  and impairments:  Abnormal gait, Decreased activity tolerance, Decreased balance, Decreased coordination, Decreased strength, Decreased endurance  Visit Diagnosis: Other abnormalities of gait and mobility  Muscle weakness (generalized)  Unsteadiness on feet     Problem List Patient Active Problem List   Diagnosis Date Noted   Encounter for long-term (current) use of high-risk medication 12/29/2020   Menorrhagia 12/16/2020   Renal cyst 11/29/2019   Intertrigo 11/29/2019   Complex care coordination 05/18/2019   Screening for cervical cancer 02/26/2019   Glucosuria 06/01/2018   Steroid-induced diabetes ([place]) 06/01/2018   Not well controlled moderate persistent asthma 04/29/2018   Gastroesophageal reflux disease 04/29/2018   Recurrent infections 04/29/2018   Dysuria 02/20/2018   Chronic rhinitis 04/03/2017   Recurrent falls 03/21/2017   Weakness 03/21/2017   Venous insufficiency 03/21/2017   OSA on CPAP 09/30/2016   Excessive daytime sleepiness 03/05/2016   Insomnia 10/04/2015   Bronchitis 05/05/2015   Abnormality of gait 03/22/2015   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   Cough variant asthma vs UACS/ vcd  03/15/2015   Pneumonia 02/14/2015   Autism spectrum disorder 02/14/2015   Nausea with vomiting 02/14/2015   Obesity, morbid ([place]) 12/13/2014   Mental retardation, 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 [place], [place], [address] Phone: [phone]   Fax:  [phone]  Name: Lori Miles MRN: [id number] Date of Birth: 1995-09-13

## 2021-02-26 ENCOUNTER — Other Ambulatory Visit: Payer: Self-pay

## 2021-02-26 ENCOUNTER — Ambulatory Visit: Payer: Medicare Other | Admitting: Physical Therapy

## 2021-02-26 DIAGNOSIS — R2681 Unsteadiness on feet: Secondary | ICD-10-CM | POA: Diagnosis not present

## 2021-02-26 DIAGNOSIS — R2689 Other abnormalities of gait and mobility: Secondary | ICD-10-CM

## 2021-02-26 DIAGNOSIS — M6281 Muscle weakness (generalized): Secondary | ICD-10-CM

## 2021-02-27 NOTE — Therapy (Signed)
[facility] [address] [place], [place], [address] Phone: [phone]   Fax:  [phone]  Physical Therapy Treatment  Patient Details  Name: Lori Miles MRN: [id number] Date of Birth: 06/16/1995 Referring Provider (PT): Rockwell Germany, NP   Encounter Date: 02/26/2021   PT End of Session - 02/27/21 1343     Visit Number 5    Number of Visits 7    Date for PT Re-Evaluation 03/09/21    Authorization Type [employer] Medicare    Authorization Time Period 01-22-21 - 03-24-21    PT Start Time 0800    PT Stop Time 0845    PT Time Calculation (min) 45 min    Activity Tolerance Patient tolerated treatment well    Behavior During Therapy [facility] for tasks assessed/performed             Past Medical History:  Diagnosis Date   Asthma    Autism    mom states no actual dx, but admits she has some symptoms   Bacteriuria    Chronic constipation    Cognitive developmental delay    Constipation    COVID-19 03/2020   Disruptive behavior disorder    Dyspnea    if she walks much   Family history of adverse reaction to anesthesia     "hives"-? if anesthesia did take antibiotic   Frequency-urgency syndrome    Gait disorder    ORGANIC PER NERUOLGIST NOTE (DR HICKLING)   Generalized convulsive epilepsy ([place]) NEUROLOGIST-  DR HICKLING   GERD (gastroesophageal reflux disease)    Gout    H/O sinus tachycardia    History of acute respiratory failure 02/14/2015   SECONDARY TO CAP   History of recurrent UTIs    Incontinent of feces    Interstitial cystitis    Intertrigo 11/29/2019   Moderate intellectual disability    OSA (obstructive sleep apnea)    no machine yet    Partial epilepsy with impairment of consciousness ([place])    FOLLOWED BY DR HICKLING   Pneumonia 2    4 times this year ( 2020)   PONV (postoperative nausea and vomiting)    Renal cyst 11/29/2019   Seizure ([place])    most recent sz " at the end of summer"-last  sz years ago per mother   Urinary incontinence     Past Surgical History:  Procedure Laterality Date   CYSTOSCOPY N/A 06/03/2017   Procedure: CYSTOSCOPY WITH EXAM UNDER ANESTHESIA;  Surgeon: Festus Aloe, MD;  Location: [facility];  Service: Urology;  Laterality: N/A;   CYSTOSCOPY  2020   CYSTOSCOPY WITH HYDRODISTENSION AND BIOPSY  04/14/2012   Procedure: CYSTOSCOPY/BIOPSY/HYDRODISTENSION;  Surgeon: Hanley Ben, MD;  Location: [facility];  Service: Urology;;  instillation of marcaine and pyridium   EUA/  PAP SMEAR/  BREAST EXAM  03-12-2016   dr Ihor Dow [facility]   EUA/ VAGINOSCOPY/ COLPOSCOPY FO THE HYMENAL RING  10-04-2009    dr Delsa Sale  [place] & MGMT  07/29/2019   IR RADIOLOGIST EVAL & MGMT  09/30/2019   IR SCLEROTHERAPY OF A FLUID COLLECTION  09/08/2019   RADIOLOGY WITH ANESTHESIA N/A 09/08/2019   Procedure: IR Sclerotherapy Treatment;  Surgeon: Radiologist, Medication, MD;  Location: [facility];  Service: Radiology;  Laterality: N/A;   TONSILLECTOMY      There were no vitals filed for this visit.   Subjective Assessment - 02/26/21 0804     Subjective  Mother states pt's left leg "was acting up" yesterday - says pt wanted to lie down last night rather than doing activities as she usually does    Currently in Pain? No/denies                               [facility] Adult PT Treatment/Exercise - 02/27/21 0001       Transfers   Transfers Sit to Stand;Stand to Sit    Sit to Stand 4: Min guard    Stand to Sit 5: Supervision    Number of Reps 10 reps    Comments --   feet on Airex - all 10 reps without UE support     Ambulation/Gait   Ambulation/Gait Yes    Ambulation/Gait Assistance 5: Supervision    Ambulation Distance (Feet) 350 Feet   3 consecutive laps - 2 reps during session   Assistive device None    Stairs Yes    Stairs Assistance 5: Supervision    Stair Management Technique Two rails;Alternating  pattern;Step to pattern    Number of Stairs 4    Height of Stairs 6      Exercises   Exercises Knee/Hip      Knee/Hip Exercises: Aerobic   Recumbent Bike SciFit level 3.0 x 6" with UE's & LE's      Knee/Hip Exercises: Machines for Strengthening   Cybex Leg Press bil. LE's  50# 3 sets 10 reps      Knee/Hip Exercises: Standing   Hip Flexion Stengthening;Right;Left;1 set;10 reps;Knee straight   2# weight - cues to perform slowly and hold for 2 secs for strengthening   Hip Abduction Stengthening;Right;Left;1 set;10 reps;Knee straight   2# weight   Hip Extension Stengthening;Right;Left;1 set;10 reps   2# weight   Forward Step Up Right;Left;1 set;10 reps                 Balance Exercises - 02/27/21 0001       Balance Exercises: Standing   Rockerboard Anterior/posterior;EO;Other reps (comment);UE support   20 reps - EO   Step Over Hurdles / Cones stepping over and back of balance beam - 5 reps each leg with minimal UE support on // bars    Marching Solid surface;Static;10 reps   with min HHA   Other Standing Exercises Pt performed stepping stones with min HHA for improved SLS                  PT Short Term Goals - 02/27/21 1348       PT SHORT TERM GOAL #1   Title Improve TUG score from 16.93 secs to </= 15 secs without device to reduce fall risk.    Baseline 16.93 secs without device;    Time 3    Period Weeks    Status New    Target Date 02/16/21      PT SHORT TERM GOAL #2   Title Perform HEP for balance and strengthening with mother's assist.    Baseline met 02-19-21    Time 3    Period Weeks    Status Achieved    Target Date 02/16/21               PT Long Term Goals - 02/27/21 1348       PT LONG TERM GOAL #1   Title Pt. will amb. 500' on flat, even surface without LOB.    Time 6  Period Weeks    Status New      PT LONG TERM GOAL #2   Title Pt will perform at least 10" on recumbent bike nonstop for improved endurance/activity tolerance.     Time 6    Period Weeks    Status New      PT LONG TERM GOAL #3   Title Improve TUG score from 16.93 to </= 13.5 secs without device.    Baseline 16.93 secs without device    Time 6    Period Weeks    Status New      PT LONG TERM GOAL #4   Title Improve 5x sit to stand score from 15.93  secs to </= 12 secs to demo improved LE strength.    Baseline 15.93 secs without UE support from high/low mat table    Time 6    Period Weeks    Status New                   Plan - 02/27/21 1344     Clinical Impression Statement Pt did well with SLS activities with minimal hand held assist needed/wanted with negotiation of stepping stones of various heights.  Pt expressed some discomfort in LLE with standing on Airex for balance training on compliant surfaces, appeared to be more behaviorally driven to avoid participation in the exercise rather than true discomfort in LLE as pt able to immediately resume activity with coercion.  Cont with POC.    Personal Factors and Comorbidities Behavior Pattern;Comorbidity 1;Past/Current Experience;Fitness;Time since onset of injury/illness/exacerbation    Comorbidities autism, developmental delay, gait disorder, asthma    Examination-Activity Limitations Locomotion Level;Transfers;Squat;Stairs;Stand;Carry    Examination-Participation Restrictions Cleaning;Community Activity;Interpersonal Relationship;Laundry;Shop;Meal Prep    Stability/Clinical Decision Making Stable/Uncomplicated    Rehab Potential Fair    PT Frequency 1x / week    PT Duration 6 weeks    PT Treatment/Interventions ADLs/Self Care Home Management;Gait training;Stair training;Functional mobility training;Therapeutic activities;Therapeutic exercise;Balance training;Patient/family education;Neuromuscular re-education    PT Next Visit Plan cont LE strengthening & balance training    Consulted and Agree with Plan of Care Patient;Family member/caregiver    Family Member Consulted mother              Patient will benefit from skilled therapeutic intervention in order to improve the following deficits and impairments:  Abnormal gait, Decreased activity tolerance, Decreased balance, Decreased coordination, Decreased strength, Decreased endurance  Visit Diagnosis: Other abnormalities of gait and mobility  Muscle weakness (generalized)  Unsteadiness on feet     Problem List Patient Active Problem List   Diagnosis Date Noted   Encounter for long-term (current) use of high-risk medication 12/29/2020   Menorrhagia 12/16/2020   Renal cyst 11/29/2019   Intertrigo 11/29/2019   Complex care coordination 05/18/2019   Screening for cervical cancer 02/26/2019   Glucosuria 06/01/2018   Steroid-induced diabetes ([place]) 06/01/2018   Not well controlled moderate persistent asthma 04/29/2018   Gastroesophageal reflux disease 04/29/2018   Recurrent infections 04/29/2018   Dysuria 02/20/2018   Chronic rhinitis 04/03/2017   Recurrent falls 03/21/2017   Weakness 03/21/2017   Venous insufficiency 03/21/2017   OSA on CPAP 09/30/2016   Excessive daytime sleepiness 03/05/2016   Insomnia 10/04/2015   Bronchitis 05/05/2015   Abnormality of gait 03/22/2015   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   Cough variant asthma vs UACS/ vcd  03/15/2015   Pneumonia 02/14/2015   Autism spectrum disorder 02/14/2015   Nausea with vomiting 02/14/2015   Obesity, morbid (  [place]) 12/13/2014   Mental retardation, 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

## 2021-03-02 DIAGNOSIS — J4520 Mild intermittent asthma, uncomplicated: Secondary | ICD-10-CM | POA: Diagnosis not present

## 2021-03-04 ENCOUNTER — Encounter (HOSPITAL_COMMUNITY): Payer: Self-pay | Admitting: Emergency Medicine

## 2021-03-04 ENCOUNTER — Other Ambulatory Visit: Payer: Self-pay

## 2021-03-04 ENCOUNTER — Emergency Department (HOSPITAL_COMMUNITY)
Admission: EM | Admit: 2021-03-04 | Discharge: 2021-03-05 | Disposition: A | Discharge status: Home / Self Care | Payer: Medicare Other | Attending: Emergency Medicine | Admitting: Emergency Medicine

## 2021-03-04 DIAGNOSIS — Z8616 Personal history of COVID-19: Secondary | ICD-10-CM | POA: Insufficient documentation

## 2021-03-04 DIAGNOSIS — Z7951 Long term (current) use of inhaled steroids: Secondary | ICD-10-CM | POA: Insufficient documentation

## 2021-03-04 DIAGNOSIS — J449 Chronic obstructive pulmonary disease, unspecified: Secondary | ICD-10-CM | POA: Diagnosis not present

## 2021-03-04 DIAGNOSIS — F840 Autistic disorder: Secondary | ICD-10-CM | POA: Diagnosis not present

## 2021-03-04 DIAGNOSIS — R300 Dysuria: Secondary | ICD-10-CM | POA: Insufficient documentation

## 2021-03-04 DIAGNOSIS — J4520 Mild intermittent asthma, uncomplicated: Secondary | ICD-10-CM | POA: Diagnosis not present

## 2021-03-04 DIAGNOSIS — J45909 Unspecified asthma, uncomplicated: Secondary | ICD-10-CM | POA: Diagnosis not present

## 2021-03-04 DIAGNOSIS — Z711 Person with feared health complaint in whom no diagnosis is made: Secondary | ICD-10-CM

## 2021-03-04 NOTE — ED Triage Notes (Signed)
Mom reports frequent UTI's.  Reports they received urine culture result today that showed enterococcus faecalis.  States she was told to bring pt to ED.

## 2021-03-04 NOTE — ED Provider Notes (Signed)
Emergency Medicine Provider Triage Evaluation Note  Lori Miles , a 25 y.o. female  was evaluated in triage.  Pt complains of uti,  Pt sent here by MD for IV vancomycin  Review of Systems  Positive: frequency Negative: Fever or chills  Physical Exam  BP 120/83    Pulse (!) 109    Temp 98.9 F (37.2 C)    Resp 18    SpO2 97%  Gen:   Awake, no distress   Resp:  Normal effort  MSK:   Other:    Medical Decision Making  Medically screening exam initiated at 9:27 PM.  Appropriate orders placed.  Lori Miles was informed that the remainder of the evaluation will be completed by another provider, this initial triage assessment does not replace that evaluation, and the importance of remaining in the ED until their evaluation is complete.     Osie Cheeks 03/04/21 2128    Vanetta Mulders, MD 03/07/21 1331

## 2021-03-05 ENCOUNTER — Ambulatory Visit: Payer: Medicare Other | Admitting: Physical Therapy

## 2021-03-05 DIAGNOSIS — Z711 Person with feared health complaint in whom no diagnosis is made: Secondary | ICD-10-CM | POA: Diagnosis not present

## 2021-03-05 LAB — URINALYSIS, ROUTINE W REFLEX MICROSCOPIC
Glucose, UA: NEGATIVE mg/dL
Hgb urine dipstick: NEGATIVE
Ketones, ur: 15 mg/dL — AB
Leukocytes,Ua: NEGATIVE
Nitrite: NEGATIVE
Protein, ur: NEGATIVE mg/dL
Specific Gravity, Urine: >1.03 — ABNORMAL HIGH (ref 1.005–1.030)
pH: 5.5 (ref 5.0–8.0)

## 2021-03-05 NOTE — ED Provider Notes (Signed)
[facility] EMERGENCY DEPARTMENT Provider Note   CSN: [id number] Arrival date & time: 03/04/21  1437     History No chief complaint on file.  Level 5 caveat: Cognitive delay  Lori Miles is a 25 y.o. female with a PMHx of cognitive developmental delay, moderate intellectual disability, who presents to the ED complaining of need for IV antibiotics onset today.  Mother reports she was told to come to the ED so patient can be treated with IV vancomycin due to enterococcus faecalis noted on urine culture that resulted on 12/10.  Mother brought paperwork in from [place] [facility] walk-in clinic that showed a urinalysis collected on 12/4 and resulted onset 12/10 positive for growth.  Mother reports the patient has frequent UTIs. Mother denies the patient complaining of any symptoms.  Mother did not give the patient any medications for her symptoms.  Mother denies the patient having fever, chills, nausea, vomiting, abdominal pain.    Per pt chart review: Pt had a UA completed on 12/16 with a culture that resulted on 12/18 that was negative for growth.   The history is provided by the patient and a parent. No language interpreter was used.      Past Medical History:  Diagnosis Date   Asthma    Autism    mom states no actual dx, but admits she has some symptoms   Bacteriuria    Chronic constipation    Cognitive developmental delay    Constipation    COVID-19 03/2020   Disruptive behavior disorder    Dyspnea    if she walks much   Family history of adverse reaction to anesthesia     "hives"-? if anesthesia did take antibiotic   Frequency-urgency syndrome    Gait disorder    ORGANIC PER NERUOLGIST NOTE (DR HICKLING)   Generalized convulsive epilepsy ([place]) NEUROLOGIST-  DR HICKLING   GERD (gastroesophageal reflux disease)    Gout    H/O sinus tachycardia    History of acute respiratory failure 02/14/2015   SECONDARY TO CAP   History of recurrent UTIs    Incontinent of  feces    Interstitial cystitis    Intertrigo 11/29/2019   Moderate intellectual disability    OSA (obstructive sleep apnea)    no machine yet    Partial epilepsy with impairment of consciousness ([place])    FOLLOWED BY DR HICKLING   Pneumonia 2    4 times this year ( 2020)   PONV (postoperative nausea and vomiting)    Renal cyst 11/29/2019   Seizure ([place])    most recent sz " at the end of summer"-last sz years ago per mother   Urinary incontinence     Patient Active Problem List   Diagnosis Date Noted   Encounter for long-term (current) use of high-risk medication 12/29/2020   Menorrhagia 12/16/2020   Renal cyst 11/29/2019   Intertrigo 11/29/2019   Complex care coordination 05/18/2019   Screening for cervical cancer 02/26/2019   Glucosuria 06/01/2018   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   Not well controlled moderate persistent asthma 04/29/2018   Gastroesophageal reflux disease 04/29/2018   Recurrent infections 04/29/2018   Dysuria 02/20/2018   Chronic rhinitis 04/03/2017   Recurrent falls 03/21/2017   Weakness 03/21/2017   Venous insufficiency 03/21/2017   OSA on CPAP 09/30/2016   Excessive daytime sleepiness 03/05/2016   Insomnia 10/04/2015   Bronchitis 05/05/2015   Abnormality of gait 03/22/2015   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   Cough variant  asthma vs UACS/ vcd  03/15/2015   Pneumonia 02/14/2015   Autism spectrum disorder 02/14/2015   Nausea with vomiting 02/14/2015   Obesity, morbid ([place]) 12/13/2014   Mental retardation, 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   Chronic  constipation 08/01/2011   Contraception 08/01/2011    Past Surgical History:  Procedure Laterality Date   CYSTOSCOPY N/A 06/03/2017   Procedure: CYSTOSCOPY WITH EXAM UNDER ANESTHESIA;  Surgeon: Festus Aloe, MD;  [facility];  Service: Urology;  Laterality: N/A;   CYSTOSCOPY  2020   CYSTOSCOPY WITH HYDRODISTENSION AND BIOPSY  04/14/2012   Procedure: CYSTOSCOPY/BIOPSY/HYDRODISTENSION;  Surgeon: Hanley Ben, MD;  Location: [facility];  Service: Urology;;  instillation of marcaine and pyridium   EUA/  PAP SMEAR/  BREAST EXAM  03-12-2016   dr Ihor Dow [facility]   EUA/ VAGINOSCOPY/ COLPOSCOPY FO THE HYMENAL RING  10-04-2009    dr Delsa Sale  [place] & MGMT  07/29/2019   IR RADIOLOGIST EVAL & MGMT  09/30/2019   IR SCLEROTHERAPY OF A FLUID COLLECTION  09/08/2019   RADIOLOGY WITH ANESTHESIA N/A 09/08/2019   Procedure: IR Sclerotherapy Treatment;  Surgeon: Radiologist, Medication, MD;  Location: [facility];  Service: Radiology;  Laterality: N/A;   TONSILLECTOMY       OB History   No obstetric history on file.     Family History  Problem Relation Age of Onset   Seizures Mother    Seizures Sister        1 sister has   Pancreatic cancer Sister    Colon cancer Neg Hx    Colon polyps Neg Hx    Esophageal cancer Neg Hx    Rectal cancer Neg Hx    Stomach cancer Neg Hx     Social History   Tobacco Use   Smoking status: Never   Smokeless tobacco: Never  Vaping Use   Vaping Use: Never used  Substance Use Topics   Alcohol use: No    Alcohol/week: 0.0 standard drinks   Drug use: No    Home Medications Prior to Admission medications   Medication Sig Start Date End Date Taking? Authorizing Provider  acetaminophen (TYLENOL) 325 MG tablet Take 2 tablets (650 mg total) by mouth every 6 (six) hours as needed for mild pain or moderate pain. 02/27/17   Waynetta Pean, PA-C  albuterol (PROVENTIL) (2.5 MG/3ML) 0.083% nebulizer  solution Take 3 mLs (2.5 mg total) by nebulization every 4 (four) hours as needed for wheezing or shortness of breath. 01/23/21   Kozlow, Donnamarie Poag, MD  albuterol (VENTOLIN HFA) 108 (90 Base) MCG/ACT inhaler INHALE 2 PUFFS INTO THE LUNGS EVERY 6 HOURS AS NEEDED FOR WHEEZING OR SHORTNESS OF BREATH 01/23/21   Kozlow, Donnamarie Poag, MD  amoxicillin-clavulanate (AUGMENTIN) 500-125 MG tablet Take 1 tablet by mouth 2 (two) times daily. 10/19/20   [provider]  benzonatate (TESSALON) 100 MG capsule Take 1 capsule (100 mg total) by mouth every 8 (eight) hours. 09/10/20   Henderly, Britni A, PA-C  budesonide (PULMICORT) 1 MG/2ML nebulizer solution USE 1 VIAL VIA NEBULIZER FOUR TIMES DAILY DURING RESPITORY INFECTIONS AND ASTHMA  FLARES FOR 1-2 WEEKS 01/23/21   Kozlow, Alvira Philips, MD  cephALEXin (KEFLEX) 500 MG capsule Take 500 mg by mouth 3 (three) times daily. 10/06/20   [provider]  cetirizine (ZYRTEC) 10 MG tablet Take 1 tablet (10 mg total) by mouth 2 (two) times daily as needed for allergies (Can use an extra dose during flare ups). 01/23/21   Kozlow, Alvira Philips, MD  Cholecalciferol (VITAMIN D3) 1.25 MG (50000 UT) CAPS Take 1 capsule by mouth once a week. 09/21/20   [provider]  ciprofloxacin (CIPRO) 500 MG tablet Take 500 mg by mouth 2 (two) times daily. 10/20/20   [provider]  clotrimazole-betamethasone (LOTRISONE) cream Apply to affected area 2 times daily prn 06/29/18   Elvina Sidle, MD  DEPAKOTE 500 MG DR tablet TAKE 1 TABLET BY MOUTH IN THE MORNING AND 2 TABLETS AT NIGHT 12/25/20   Elveria Rising, NP  estrogens, conjugated, (PREMARIN) 1.25 MG tablet TAKE ONE BY MOUTH TWICE PER DAY 11/07/20   Constant, Peggy, MD  famotidine (PEPCID) 20 MG tablet TAKE 1 TABLET(20 MG) BY MOUTH EVERY 12 HOURS 01/23/21   Kozlow, Alvira Philips, MD  FEROSUL 325 (65 Fe) MG tablet Take 325 mg by mouth daily. 10/01/20   [provider]  fluconazole (DIFLUCAN) 150 MG tablet Take 1 tablet (150 mg total)  by mouth as needed (when on antibiotics). 11/07/20   Constant, Peggy, MD  fluticasone (FLONASE) 50 MCG/ACT nasal spray Place 2 sprays into both nostrils daily. 09/10/20   Henderly, Britni A, PA-C  formoterol (PERFOROMIST) 20 MCG/2ML nebulizer solution USE 1 VIAL VIA NEBULIZER TWICE DAILY 01/23/21   Kozlow, Alvira Philips, MD  hydrOXYzine (ATARAX/VISTARIL) 25 MG tablet Take 25 mg by mouth 3 (three) times daily as needed (pain).  01/13/19   [provider]  ipratropium-albuterol (DUONEB) 0.5-2.5 (3) MG/3ML SOLN Take 3 mLs by nebulization every 4 (four) hours as needed. 01/23/21   Kozlow, Alvira Philips, MD  melatonin 3 MG TABS tablet Take 2 tablets (6mg ) by mouth at bedtime. 01/28/20   13/12/21, NP  mometasone (NASONEX) 50 MCG/ACT nasal spray Use one spray in each nostril once daily as directed. 05/09/20   Kozlow, 05/11/20, MD  montelukast (SINGULAIR) 10 MG tablet TAKE 1 TABLET(10 MG) BY MOUTH AT BEDTIME 01/23/21   Kozlow, 13/8/22, MD  mupirocin nasal ointment (BACTROBAN NASAL) 2 % Place 1 application into the nose 3 (three) times daily. Use  in each nostril twice daily for twenty one (21) days. Patient taking differently: Place 1 application into the nose 3 (three) times daily. Use  in each nostril twice daily for twenty one (21) days. 02/08/20   Kozlow, 02/10/20, MD  Nebulizer MISC Adult mask with tubing. Use as directed with nebulizer device. 11/18/18   Kozlow, 01/18/19, MD  norethindrone (AYGESTIN) 5 MG tablet Take 2 tablets (10 mg total) by mouth daily. 02/05/21   02/07/21, MD  nystatin (MYCOSTATIN/NYSTOP) powder Apply 1 application topically 3 (three) times daily. 11/07/20   Constant, Peggy, MD  Olopatadine HCl 0.2 % SOLN Can use one drop in each eye once daily if needed for red, itching, watery eyes. 01/23/21   Kozlow, 13/8/22, MD  ondansetron (ZOFRAN ODT) 4 MG disintegrating tablet Take 2 tablets (8 mg total) by mouth every 8 (eight) hours as needed for nausea or vomiting. 09/10/20   Henderly, Britni A,  PA-C  polyethylene glycol powder (GLYCOLAX/MIRALAX) 17 GM/SCOOP powder DISSOLVE 1 CAPFUL(17 GRAMS) IN AT LEAST  8 OUNCES OF WATER/JUICE TWICE DAILY 09/21/20   Pyrtle, Lajuan Lines, MD  senna (SENOKOT) 8.6 MG tablet Take 1 tablet by mouth at bedtime.     [provider]  terconazole (TERAZOL 3) 0.8 % vaginal cream Place 1 applicator vaginally at bedtime. Apply nightly for three nights. 11/07/20   Constant, Peggy, MD  terconazole (TERAZOL 7) 0.4 % vaginal cream Place 1 applicator vaginally at bedtime. 01/28/19   Lajean Manes, CNM  traZODone (DESYREL) 50 MG tablet TAKE 2 TABLETS(100 MG) BY MOUTH AT BEDTIME 02/02/21   Rockwell Germany, NP    Allergies    Abilify [aripiprazole], Seroquel [quetiapine fumarate], Amoxicillin-pot clavulanate, Bactrim [sulfamethoxazole-trimethoprim], Doxycycline, Macrobid [nitrofurantoin macrocrystal], and Keppra [levetiracetam]  Review of Systems   Review of Systems  Unable to perform ROS: Other (Cognitive developmental delay)   Physical Exam Updated Vital Signs BP 105/78    Pulse (!) 102    Temp 98.9 F (37.2 C) (Oral)    Resp 18    Ht 4\' 9"  (1.448 m)    Wt 83.9 kg    SpO2 100%    BMI 40.03 kg/m   Physical Exam Vitals and nursing note reviewed.  Constitutional:      General: She is awake. She is not in acute distress.    Appearance: She is not ill-appearing, toxic-appearing or diaphoretic.     Comments: Pleasant, well-appearing, nontoxic-appearing patient.  HENT:     Head: Normocephalic and atraumatic.     Mouth/Throat:     Pharynx: No oropharyngeal exudate.  Eyes:     General: No scleral icterus.    Conjunctiva/sclera: Conjunctivae normal.  Cardiovascular:     Rate and Rhythm: Normal rate and regular rhythm.     Pulses: Normal pulses.     Heart sounds: Normal heart sounds.  Pulmonary:     Effort: Pulmonary effort is normal. No respiratory distress.     Breath sounds: Normal breath sounds. No wheezing.  Abdominal:     General: Bowel sounds are  normal.     Palpations: Abdomen is soft. There is no mass.     Tenderness: There is no abdominal tenderness. There is no right CVA tenderness, left CVA tenderness, guarding or rebound.     Comments: No abdominal tenderness to palpation.  No CVA tenderness bilaterally.  Musculoskeletal:        General: Normal range of motion.     Cervical back: Normal range of motion and neck supple.     Comments: No C, T, L, S spinal tenderness to palpation.  No tenderness to palpation of C, T, L, S musculature.  No overlying skin changes.  Skin:    General: Skin is warm and dry.  Neurological:     Mental Status: She is alert.  Psychiatric:        Behavior: Behavior normal. Behavior is cooperative.    ED Results / Procedures / Treatments   Labs (all labs ordered are listed, but only abnormal results are displayed) Labs Reviewed  URINALYSIS, ROUTINE W REFLEX MICROSCOPIC - Abnormal; Notable for the following components:      Result Value   Color, Urine AMBER (*)    Specific Gravity, Urine >1.030 (*)    Bilirubin Urine SMALL (*)    Ketones, ur 15 (*)    Bacteria, UA RARE (*)    All other components within normal limits  URINE CULTURE    EKG None  Radiology No results found.  Procedures Procedures   Medications Ordered in ED  Medications - No data to display  ED Course  I have reviewed the triage vital signs and the nursing notes.  Pertinent labs & imaging results that were available during my care of the patient were reviewed by me and considered in my medical decision making (see chart for details).  Clinical Course as of 03/05/21 1007  Mon Mar 05, 2021  0705 Case discussed with Attending who will evaluate patient. [SB]  0758 Patient re-evaluated, patient fully dressed and standing in room with mother. Mother upset that patient not receiving IV vancomycin. Discussed thoroughly with patient mother that recent urine culture noted no growth and there is no infection noted on urinalysis in  the ED today, therefore no need for IV abx at this time. Mother was informed by Attending same information and that she can be discharged. Will call [facility] to find out more information. Also requested that patient provide urine sample for urine culture. Mother agreeable.  [SB]  0802 Attempted to call [facility] walk-in clinic where patient mother reports that she was informed to bring the patient in due to positive culture that resulted on 12/10.  Mother notified.  Discussed with mother discharge treatment plan at this time. [SB]  0808 RN notified me that the patients ride was here and that they were ready to be discharged. [SB]  [id number] Discussed with mother discharge treatment plan and inability to get in contact with [facility] walk-in clinic. Mother notifies that she left a voicemail with them. Patient appears safe for discharge at this time. [SB]    Clinical Course User Index [SB] Phelix Fudala A, PA-C   MDM Rules/Calculators/A&P                         Patient brought into the ED by her mother, Patient's mother with concerns of needing IV vancomycin.  Mother reports she was called by [facility] walk-in clinic on yesterday and informed to go to the ED for IV vancomycin due to positive growth of Enterococcus faecalis on urinalysis that resulted on 12/10.  Patient is asymptomatic in the ED.  Vital signs stable, patient afebrile, oxygen saturation 100%.  On exam patient without acute cardiovascular, respiratory, abdominal exam findings.  No bilateral CVA tenderness on exam. Differential diagnosis includes acute cystitis, pyelonephritis, or nephrolithiasis.  No need for imaging at this time, patient afebrile, no CVA tenderness on exam, patient well-appearing. Urinalysis today in the ED without acute signs of infection.   Pt tolerating PO intake in ED. Patient very pleasant, during my evaluation, patient asking to go home and to eat food notably [employer]. Upon chart review, patient mother with multiple  request of antibiotics for UTI when not medically necessary. Mother brings in document from [facility] urgent care of an urine culture collected on 12/4 and resulted on 12/10 positive for enterococcus faecalis. Patient with urinalysis culture collected on 12/16, that resulted on 12/18 and was negative for growth. Discussed with patient and mother in detail regarding negative urinalysis today and no need for antibiotics at this time.  Patient mother adamant that patient needs IV vancomycin to treat UTI that the patient has.  Discussed with mother thoroughly regarding urinalysis from ED visit today, recent urine culture from 12/16 with no growth and how that negates antibiotic use at this time.  Mother still adamant on patient needing antibiotics.    Case discussed with attending who also evaluated patient and informed that there was no need for antibiotics at this time  and that patient could be safely discharged.  Following that discussion, mother requested to speak with me again, spoke with patient's mother and informed that if patient is able to urinate we can collect a urine culture today to see if there is any microbial growth.  Also discussed with patient mother that I would call [facility] walk-in clinic to speak with the provider who notified her that patient needed IV vancomycin. Patient mother agreeable at this time.    Attempted to call [facility] walk-in clinic multiple times, however they are not open until several hours from now.  Went to inform mother of this when RN notified me that mother was ready to go when her ride was outside.  Prior to discharge, patient is fully dressed, pleasant, well-appearing and standing in room.  Patient again verbalizing she is ready to go home and wants [employer].  Discussed with patient mother of failed attempt to call [facility] walk-in clinic, mother reports similar issue and that she left him a voicemail.  Mother reports that she may take the patient to another ED to be treated with  IV vancomycin.  Discussed with mother that she will be should be able to see the results of patient's urine culture on MyChart.  Mother agreeable to discharge treatment plan.  Due to patient being afebrile, no acute abdominal tenderness to palpation, no CVA tenderness bilaterally, urinalysis without concerns for infection. Low suspicion for acute cystitis, nephrolithiasis or pyelonephritis at this time.  Supportive care measures and strict return precautions discussed with patient's mother today.  Mother acknowledges and verbalized understanding.  Patient for safe discharge at this time.  Follow-up as indicated in discharge paperwork.  Final Clinical Impression(s) / ED Diagnoses Final diagnoses:  Worried well    Rx / DC Orders ED Discharge Orders     None        Gaberial Cada A, PA-C 03/05/21 1007    Quintella Reichert, MD 03/05/21 [phone]

## 2021-03-05 NOTE — ED Notes (Signed)
Mother requested purewick to obtain UA sample due to pt being incontinent

## 2021-03-05 NOTE — Discharge Instructions (Signed)
Your urinalysis was negative for infection in the ED today.  You have an urine culture that is ordered and pending with results and 48 hours.  Follow-up on MyChart.  You may follow-up with your primary care provider as needed.  Ensure to maintain fluid intake.  Return to the emergency department if you are experiencing increasing or worsening abdominal pain, urinary symptoms, or decreased fluid intake.

## 2021-03-05 NOTE — ED Notes (Signed)
Pa  at bedside. 

## 2021-03-05 NOTE — ED Notes (Signed)
Per mother pt has hx of frequent UTI seen by provider and advised to come to ED due to positive UA culture

## 2021-03-05 NOTE — ED Notes (Signed)
Pt in room at this time  

## 2021-03-06 LAB — URINE CULTURE

## 2021-03-08 NOTE — Telephone Encounter (Signed)
Error

## 2021-03-13 ENCOUNTER — Encounter: Payer: Self-pay | Admitting: Physical Therapy

## 2021-03-13 ENCOUNTER — Ambulatory Visit: Payer: Medicare Other | Admitting: Physical Therapy

## 2021-03-13 ENCOUNTER — Other Ambulatory Visit: Payer: Self-pay

## 2021-03-13 DIAGNOSIS — R2689 Other abnormalities of gait and mobility: Secondary | ICD-10-CM

## 2021-03-13 DIAGNOSIS — R2681 Unsteadiness on feet: Secondary | ICD-10-CM | POA: Diagnosis not present

## 2021-03-13 DIAGNOSIS — M6281 Muscle weakness (generalized): Secondary | ICD-10-CM | POA: Diagnosis not present

## 2021-03-13 NOTE — Therapy (Signed)
[facility] [address] [place], [place], [address] Phone: [phone]   Fax:  [phone]  Physical Therapy Treatment  Patient Details  Name: Lori Miles MRN: [id number] Date of Birth: January 26, 1996 Referring Provider (PT): Rockwell Germany, NP   Encounter Date: 03/13/2021   PT End of Session - 03/13/21 1005     Visit Number 6    Number of Visits 7    Date for PT Re-Evaluation 03/09/21    Authorization Type UHC Medicare    Authorization Time Period 01-22-21 - 03-24-21    PT Start Time 0759    PT Stop Time 0845    PT Time Calculation (min) 46 min    Activity Tolerance Patient tolerated treatment well    Behavior During Therapy [facility] for tasks assessed/performed             Past Medical History:  Diagnosis Date   Asthma    Autism    mom states no actual dx, but admits she has some symptoms   Bacteriuria    Chronic constipation    Cognitive developmental delay    Constipation    COVID-19 03/2020   Disruptive behavior disorder    Dyspnea    if she walks much   Family history of adverse reaction to anesthesia     "hives"-? if anesthesia did take antibiotic   Frequency-urgency syndrome    Gait disorder    ORGANIC PER NERUOLGIST NOTE (DR HICKLING)   Generalized convulsive epilepsy ([place]) NEUROLOGIST-  DR HICKLING   GERD (gastroesophageal reflux disease)    Gout    H/O sinus tachycardia    History of acute respiratory failure 02/14/2015   SECONDARY TO CAP   History of recurrent UTIs    Incontinent of feces    Interstitial cystitis    Intertrigo 11/29/2019   Moderate intellectual disability    OSA (obstructive sleep apnea)    no machine yet    Partial epilepsy with impairment of consciousness ([place])    FOLLOWED BY DR HICKLING   Pneumonia 2    4 times this year ( 2020)   PONV (postoperative nausea and vomiting)    Renal cyst 11/29/2019   Seizure ([place])    most recent sz " at the end of summer"-last  sz years ago per mother   Urinary incontinence     Past Surgical History:  Procedure Laterality Date   CYSTOSCOPY N/A 06/03/2017   Procedure: CYSTOSCOPY WITH EXAM UNDER ANESTHESIA;  Surgeon: Festus Aloe, MD;  Location: [facility];  Service: Urology;  Laterality: N/A;   CYSTOSCOPY  2020   CYSTOSCOPY WITH HYDRODISTENSION AND BIOPSY  04/14/2012   Procedure: CYSTOSCOPY/BIOPSY/HYDRODISTENSION;  Surgeon: Hanley Ben, MD;  Location: [facility];  Service: Urology;;  instillation of marcaine and pyridium   EUA/  PAP SMEAR/  BREAST EXAM  03-12-2016   dr Ihor Dow [facility]   EUA/ VAGINOSCOPY/ COLPOSCOPY FO THE HYMENAL RING  10-04-2009    dr Delsa Sale  [place] & MGMT  07/29/2019   IR RADIOLOGIST EVAL & MGMT  09/30/2019   IR SCLEROTHERAPY OF A FLUID COLLECTION  09/08/2019   RADIOLOGY WITH ANESTHESIA N/A 09/08/2019   Procedure: IR Sclerotherapy Treatment;  Surgeon: Radiologist, Medication, MD;  Location: [facility];  Service: Radiology;  Laterality: N/A;   TONSILLECTOMY      There were no vitals filed for this visit.   Subjective Assessment - 03/13/21 0754     Subjective  Mother states pt finished up the antibiotic Vancomysin yesterday; states Truly dropped her cup yesterday in the car trying to hold it - doesn't understand why that happens sometimes    Currently in Pain? No/denies                               [facility] Adult PT Treatment/Exercise - 03/13/21 0821       Transfers   Transfers Sit to Stand;Stand to Sit    Sit to Stand 5: Supervision;4: Min guard    Stand to Sit 5: Supervision    Number of Reps 10 reps    Comments No UE support used - feet on floor 5 reps; 5 reps feet on Airex - from hi/lo table      Ambulation/Gait   Ambulation/Gait Yes    Ambulation/Gait Assistance 5: Supervision    Ambulation Distance (Feet) 350 Feet   3 reps during session = 1050' total   Assistive device None      Knee/Hip  Exercises: Aerobic   Recumbent Bike SciFit level 2.5 x 10" with UE's & LE's      Knee/Hip Exercises: Machines for Strengthening   Cybex Leg Press bil. LE's  60#  2 sets 10 reps      Knee/Hip Exercises: Standing   Hip Flexion Stengthening;Right;Left;1 set;10 reps;Knee straight   2# weight - cues to perform slowly and hold for 2 secs for strengthening   Hip Abduction Stengthening;Right;Left;1 set;10 reps;Knee straight   2# weight   Hip Extension Stengthening;Right;Left;1 set;10 reps   2# weight   Forward Step Up Right;Left;1 set;10 reps;Hand Hold: 2;Step Height: 6"   10 reps each leg - 1 set each                Balance Exercises - 03/13/21 0001       Balance Exercises: Standing   Rockerboard Anterior/posterior;EO;Other reps (comment);UE support   30 reps - EO   Step Over Hurdles / Cones stepping over and back of balance beam - 5 reps each leg with minimal hand held asssit    Marching Static;10 reps;Foam/compliant surface   with min HHA   Other Standing Exercises Pt performed stepping stones with SBA to CGA for improved SLS on each leg                  PT Short Term Goals - 03/13/21 1008       PT SHORT TERM GOAL #1   Title Improve TUG score from 16.93 secs to </= 15 secs without device to reduce fall risk.    Baseline 16.93 secs without device;    Time 3    Period Weeks    Status New    Target Date 02/16/21      PT SHORT TERM GOAL #2   Title Perform HEP for balance and strengthening with mother's assist.    Baseline met 02-19-21    Time 3    Period Weeks    Status Achieved    Target Date 02/16/21               PT Long Term Goals - 03/13/21 1008       PT LONG TERM GOAL #1   Title Pt. will amb. 500' on flat, even surface without LOB.    Time 6    Period Weeks    Status New      PT LONG TERM GOAL #2  Title Pt will perform at least 10" on recumbent bike nonstop for improved endurance/activity tolerance.    Time 6    Period Weeks    Status New       PT LONG TERM GOAL #3   Title Improve TUG score from 16.93 to </= 13.5 secs without device.    Baseline 16.93 secs without device    Time 6    Period Weeks    Status New      PT LONG TERM GOAL #4   Title Improve 5x sit to stand score from 15.93  secs to </= 12 secs to demo improved LE strength.    Baseline 15.93 secs without UE support from high/low mat table    Time 6    Period Weeks    Status New                   Plan - 03/13/21 1006     Clinical Impression Statement Pt demonstrating improved balance with pt performing stepping stone balance activity with only SBA for first time today - min hand held assist has been needed for safety in previous sessions.  Pt also demonstrating increased strength in bil. LE's with weights increased from 50# to 60# on leg press.  Pt is progressing well towards LTG's - plan D/C next session.    Personal Factors and Comorbidities Behavior Pattern;Comorbidity 1;Past/Current Experience;Fitness;Time since onset of injury/illness/exacerbation    Comorbidities autism, developmental delay, gait disorder, asthma    Examination-Activity Limitations Locomotion Level;Transfers;Squat;Stairs;Stand;Carry    Examination-Participation Restrictions Cleaning;Community Activity;Interpersonal Relationship;Laundry;Shop;Meal Prep    Stability/Clinical Decision Making Stable/Uncomplicated    Rehab Potential Fair    PT Frequency 1x / week    PT Duration 6 weeks    PT Treatment/Interventions ADLs/Self Care Home Management;Gait training;Stair training;Functional mobility training;Therapeutic activities;Therapeutic exercise;Balance training;Patient/family education;Neuromuscular re-education    PT Next Visit Plan cont LE strengthening & balance training    Consulted and Agree with Plan of Care Patient;Family member/caregiver    Family Member Consulted mother             Patient will benefit from skilled therapeutic intervention in order to improve the  following deficits and impairments:  Abnormal gait, Decreased activity tolerance, Decreased balance, Decreased coordination, Decreased strength, Decreased endurance  Visit Diagnosis: Other abnormalities of gait and mobility  Muscle weakness (generalized)  Unsteadiness on feet     Problem List Patient Active Problem List   Diagnosis Date Noted   Encounter for long-term (current) use of high-risk medication 12/29/2020   Menorrhagia 12/16/2020   Renal cyst 11/29/2019   Intertrigo 11/29/2019   Complex care coordination 05/18/2019   Screening for cervical cancer 02/26/2019   Glucosuria 06/01/2018   Steroid-induced diabetes ([place]) 06/01/2018   Not well controlled moderate persistent asthma 04/29/2018   Gastroesophageal reflux disease 04/29/2018   Recurrent infections 04/29/2018   Dysuria 02/20/2018   Chronic rhinitis 04/03/2017   Recurrent falls 03/21/2017   Weakness 03/21/2017   Venous insufficiency 03/21/2017   OSA on CPAP 09/30/2016   Excessive daytime sleepiness 03/05/2016   Insomnia 10/04/2015   Bronchitis 05/05/2015   Abnormality of gait 03/22/2015   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   Cough variant asthma vs UACS/ vcd  03/15/2015   Pneumonia 02/14/2015   Autism spectrum disorder 02/14/2015   Nausea with vomiting 02/14/2015   Obesity, morbid ([place]) 12/13/2014   Mental retardation, 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 [place], [place], [address] Phone: [phone]   Fax:  [phone]  Name: AUBRINA NIEMAN MRN: [id number] Date of Birth: 01/09/1996

## 2021-03-27 ENCOUNTER — Telehealth: Payer: Self-pay

## 2021-03-27 ENCOUNTER — Ambulatory Visit: Payer: Medicare Other | Attending: Physician Assistant | Admitting: Physical Therapy

## 2021-03-27 ENCOUNTER — Other Ambulatory Visit: Payer: Self-pay | Admitting: *Deleted

## 2021-03-27 ENCOUNTER — Other Ambulatory Visit: Payer: Self-pay

## 2021-03-27 DIAGNOSIS — M6281 Muscle weakness (generalized): Secondary | ICD-10-CM | POA: Diagnosis not present

## 2021-03-27 DIAGNOSIS — R2681 Unsteadiness on feet: Secondary | ICD-10-CM | POA: Diagnosis not present

## 2021-03-27 DIAGNOSIS — R2689 Other abnormalities of gait and mobility: Secondary | ICD-10-CM | POA: Diagnosis not present

## 2021-03-27 MED ORDER — BROVANA 15 MCG/2ML IN NEBU: 15.0000 ug | INHALATION_SOLUTION | Freq: Two times a day (BID) | RESPIRATORY_TRACT | 5 refills | Status: DC

## 2021-03-27 NOTE — Telephone Encounter (Signed)
Can we see if we can get her Norm Salt which is basically Perforomist but by a different name.

## 2021-03-27 NOTE — Telephone Encounter (Signed)
New prescription has been sent in. Called patients mother and let her know, she wanted to know if the Rosalyn Gess can be mixed with the Pulmicort nebulizer medication like the Perforomist was mixed.

## 2021-03-27 NOTE — Telephone Encounter (Signed)
Patients mom called stating the Perforomist is still on back order. Patients mom states she has contacted multiple pharmacies. The patient is currently using the Pulmicort and Ipratropium. Mom is wondering if you wanted to change the Perforomist to anything else or to keep doing the Pulmicort and ipratropium for now?   [facility]

## 2021-03-28 NOTE — Telephone Encounter (Signed)
Patient's mother would like a call back about medication. Mother did not provide medication name but stated it was recently prescribed.   Best contact number: [phone]

## 2021-03-28 NOTE — Telephone Encounter (Signed)
Prior authorization is needed for Brovona. Will initiate PA in the morning 03/29/21

## 2021-03-28 NOTE — Telephone Encounter (Signed)
Spoke to patient mom and informed her of Dr. Lucie Leather recommendation and she understood.

## 2021-03-28 NOTE — Therapy (Signed)
[facility] [address] [place], [place], [address] Phone: [phone]   Fax:  [phone]  Physical Therapy Treatment & Discharge Summary  Patient Details  Name: Lori Miles MRN: [id number] Date of Birth: Nov 06, 1995 Referring Provider (PT): Rockwell Germany, NP   Encounter Date: 03/27/2021   PT End of Session - 03/28/21 1226     Visit Number 7    Number of Visits 7    Date for PT Re-Evaluation 03/09/21    Authorization Type UHC Medicare    Authorization Time Period 01-22-21 - 03-24-21    PT Start Time 0757    PT Stop Time 0845    PT Time Calculation (min) 48 min    Activity Tolerance Patient tolerated treatment well    Behavior During Therapy [facility] for tasks assessed/performed             Past Medical History:  Diagnosis Date   Asthma    Autism    mom states no actual dx, but admits she has some symptoms   Bacteriuria    Chronic constipation    Cognitive developmental delay    Constipation    COVID-19 03/2020   Disruptive behavior disorder    Dyspnea    if she walks much   Family history of adverse reaction to anesthesia     "hives"-? if anesthesia did take antibiotic   Frequency-urgency syndrome    Gait disorder    ORGANIC PER NERUOLGIST NOTE (DR HICKLING)   Generalized convulsive epilepsy ([place]) NEUROLOGIST-  DR HICKLING   GERD (gastroesophageal reflux disease)    Gout    H/O sinus tachycardia    History of acute respiratory failure 02/14/2015   SECONDARY TO CAP   History of recurrent UTIs    Incontinent of feces    Interstitial cystitis    Intertrigo 11/29/2019   Moderate intellectual disability    OSA (obstructive sleep apnea)    no machine yet    Partial epilepsy with impairment of consciousness ([place])    FOLLOWED BY DR HICKLING   Pneumonia 2    4 times this year ( 2020)   PONV (postoperative nausea and vomiting)    Renal cyst 11/29/2019   Seizure ([place])    most recent sz " at the  end of summer"-last sz years ago per mother   Urinary incontinence     Past Surgical History:  Procedure Laterality Date   CYSTOSCOPY N/A 06/03/2017   Procedure: CYSTOSCOPY WITH EXAM UNDER ANESTHESIA;  Surgeon: Festus Aloe, MD;  [facility]: [facility];  Service: Urology;  Laterality: N/A;   CYSTOSCOPY  2020   CYSTOSCOPY WITH HYDRODISTENSION AND BIOPSY  04/14/2012   Procedure: CYSTOSCOPY/BIOPSY/HYDRODISTENSION;  Surgeon: Hanley Ben, MD;  Location: [facility];  Service: Urology;;  instillation of marcaine and pyridium   EUA/  PAP SMEAR/  BREAST EXAM  03-12-2016   dr Ihor Dow [facility]   EUA/ VAGINOSCOPY/ COLPOSCOPY FO THE HYMENAL RING  10-04-2009    dr Delsa Sale  [place] & MGMT  07/29/2019   IR RADIOLOGIST EVAL & MGMT  09/30/2019   IR SCLEROTHERAPY OF A FLUID COLLECTION  09/08/2019   RADIOLOGY WITH ANESTHESIA N/A 09/08/2019   Procedure: IR Sclerotherapy Treatment;  Surgeon: Radiologist, Medication, MD;  Location: [facility];  Service: Radiology;  Laterality: N/A;   TONSILLECTOMY      There were no vitals filed for this visit.   Subjective Assessment - 03/27/21 0759  Subjective Mother states pt is now on Keflex as the other antibiotic did not completely clear up the UTI; pt reports no pain or problems - D/C is planned today    Currently in Pain? No/denies                               [facility] Adult PT Treatment/Exercise - 03/28/21 0001       Transfers   Transfers Sit to Stand;Stand to Sit    Sit to Stand 5: Supervision;4: Min guard    Five time sit to stand comments  25.35, 15.10    Stand to Sit 5: Supervision    Number of Reps Other reps (comment)   5 reps - performed 2 times as pt did not understand and follow instructions on 1st trial   Comments No UE support used - feet on floor - pt had wide BOS      Ambulation/Gait   Ambulation/Gait Yes    Ambulation/Gait Assistance 5: Supervision    Ambulation  Distance (Feet) 500 Feet    Assistive device None    Gait Pattern Within Functional Limits    Stairs Yes    Stairs Assistance 5: Supervision    Stair Management Technique Two rails;Alternating pattern;Step to pattern    Number of Stairs 4    Height of Stairs 6      Standardized Balance Assessment   Standardized Balance Assessment Timed Up and Go Test      Timed Up and Go Test   TUG Normal TUG    Normal TUG (seconds) 15.12      Exercises   Exercises Knee/Hip      Knee/Hip Exercises: Aerobic   Nustep Nustep level 4 x 5" with UE's and LE's      Knee/Hip Exercises: Machines for Strengthening   Cybex Leg Press bil. LE's  50# 3 sets 10 reps      Knee/Hip Exercises: Standing   Hip Flexion Stengthening;Right;Left;1 set;10 reps;Knee straight   3# weight - cues to perform slowly and hold for 2 secs for strengthening   Hip Abduction Stengthening;Right;Left;1 set;10 reps;Knee straight   3# weight   Hip Extension Stengthening;Right;Left;1 set;10 reps   3# weight   Forward Step Up Right;Left;1 set;10 reps;Hand Hold: 2;Step Height: 6"   10 reps each leg - 1 set each                      PT Short Term Goals - 03/28/21 1227       PT SHORT TERM GOAL #1   Title Improve TUG score from 16.93 secs to </= 15 secs without device to reduce fall risk.    Baseline 16.93 secs without device;    Time 3    Period Weeks    Status Not Met    Target Date 02/16/21      PT SHORT TERM GOAL #2   Title Perform HEP for balance and strengthening with mother's assist.    Baseline met 02-19-21    Time 3    Period Weeks    Status Achieved    Target Date 02/16/21               PT Long Term Goals - 03/27/21 0809       PT LONG TERM GOAL #1   Title Pt. will amb. 500' on flat, even surface without LOB.    Baseline met 03-27-21  Time 6    Period Weeks    Status Achieved      PT LONG TERM GOAL #2   Title Pt will perform at least 10" on recumbent bike nonstop for improved  endurance/activity tolerance.    Time 6    Period Weeks    Status Partially Met      PT LONG TERM GOAL #3   Title Improve TUG score from 16.93 to </= 13.5 secs without device.    Baseline 16.93 secs without device; TUG score 15.12 secs on 03-27-21    Time 6    Period Weeks    Status Not Met      PT LONG TERM GOAL #4   Title Improve 5x sit to stand score from 15.93  secs to </= 12 secs to demo improved LE strength.    Baseline 15.93 secs without UE support from high/low mat table; 15.10 secs on 03-27-21    Time 6    Period Weeks    Status Not Met                   Plan - 03/28/21 1229     Clinical Impression Statement Pt has met LTG #1 and partially met LTG #2; LTG's #3 and 4 not met.  Pt is discharged due to completion of program and no further needs identified at this time.  Pt has plateaued in maximizing functional status at current time - no further PT is warranted.    Personal Factors and Comorbidities Behavior Pattern;Comorbidity 1;Past/Current Experience;Fitness;Time since onset of injury/illness/exacerbation    Comorbidities autism, developmental delay, gait disorder, asthma    Examination-Activity Limitations Locomotion Level;Transfers;Squat;Stairs;Stand;Carry    Examination-Participation Restrictions Cleaning;Community Activity;Interpersonal Relationship;Laundry;Shop;Meal Prep    Stability/Clinical Decision Making Stable/Uncomplicated    Rehab Potential Fair    PT Frequency 1x / week    PT Duration 6 weeks    PT Treatment/Interventions ADLs/Self Care Home Management;Gait training;Stair training;Functional mobility training;Therapeutic activities;Therapeutic exercise;Balance training;Patient/family education;Neuromuscular re-education    PT Next Visit Plan D/C 03-27-21    Consulted and Agree with Plan of Care Patient;Family member/caregiver    Family Member Consulted mother             Patient will benefit from skilled therapeutic intervention in order to  improve the following deficits and impairments:  Abnormal gait, Decreased activity tolerance, Decreased balance, Decreased coordination, Decreased strength, Decreased endurance  Visit Diagnosis: Other abnormalities of gait and mobility  Muscle weakness (generalized)  Unsteadiness on feet     Problem List Patient Active Problem List   Diagnosis Date Noted   Encounter for long-term (current) use of high-risk medication 12/29/2020   Menorrhagia 12/16/2020   Renal cyst 11/29/2019   Intertrigo 11/29/2019   Complex care coordination 05/18/2019   Screening for cervical cancer 02/26/2019   Glucosuria 06/01/2018   Steroid-induced diabetes ([place]) 06/01/2018   Not well controlled moderate persistent asthma 04/29/2018   Gastroesophageal reflux disease 04/29/2018   Recurrent infections 04/29/2018   Dysuria 02/20/2018   Chronic rhinitis 04/03/2017   Recurrent falls 03/21/2017   Weakness 03/21/2017   Venous insufficiency 03/21/2017   OSA on CPAP 09/30/2016   Excessive daytime sleepiness 03/05/2016   Insomnia 10/04/2015   Bronchitis 05/05/2015   Abnormality of gait 03/22/2015   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   Cough variant asthma vs UACS/ vcd  03/15/2015   Pneumonia 02/14/2015   Autism spectrum disorder 02/14/2015   Nausea with vomiting 02/14/2015   Obesity, morbid ([place]) 12/13/2014  Mental retardation, 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   Chronic constipation 08/01/2011   Contraception 08/01/2011    PHYSICAL THERAPY DISCHARGE SUMMARY  Visits from Start of Care:  7  Current functional  level related to goals / functional outcomes: See above for progress towards goals   Remaining deficits: Continued mild gait deviations as pt internally rotates LLE in swing phase of gait - able to externally rotate Lt leg with verbal cues    Education / Equipment: Pt/mother have been instructed in HEP for balance and strengthening exs; pt is supposed to be receiving a recumbent bike for home use through [employer] program   Patient agrees to discharge. Patient goals were partially met. Patient is being discharged due to meeting the stated rehab goals.   Alda Lea, PT 03/28/2021, 12:32 PM  [facility] [address] [place], [place], [address] Phone: [phone]   Fax:  [phone]  Name: Lori Miles MRN: [id number] Date of Birth: 1996-02-24

## 2021-03-28 NOTE — Telephone Encounter (Signed)
Please inform Lori Miles's mom that although the instructions state not to mix those together most of her patients commonly to mix them together without any adverse effects.  But Perforomist or Brovana should only be used twice a day and not 4 times a day.

## 2021-03-29 ENCOUNTER — Other Ambulatory Visit: Payer: Self-pay | Admitting: *Deleted

## 2021-03-29 MED ORDER — BROVANA 15 MCG/2ML IN NEBU: 15.0000 ug | INHALATION_SOLUTION | Freq: Two times a day (BID) | RESPIRATORY_TRACT | 5 refills | Status: DC

## 2021-03-29 NOTE — Telephone Encounter (Signed)
PA has been submitted through CoverMyMeds and is currently pending approval/denial.  

## 2021-03-29 NOTE — Telephone Encounter (Signed)
Insurance determined that the medication is covered under the Medicare part B. Resent prescription with diagnosis code and instruction to file under Part B. Called patients mom and advised, patients mother verbalized understanding.

## 2021-04-03 DIAGNOSIS — J4520 Mild intermittent asthma, uncomplicated: Secondary | ICD-10-CM | POA: Diagnosis not present

## 2021-04-04 DIAGNOSIS — J4520 Mild intermittent asthma, uncomplicated: Secondary | ICD-10-CM | POA: Diagnosis not present

## 2021-04-11 DIAGNOSIS — J4540 Moderate persistent asthma, uncomplicated: Secondary | ICD-10-CM | POA: Diagnosis not present

## 2021-04-11 DIAGNOSIS — R0981 Nasal congestion: Secondary | ICD-10-CM | POA: Diagnosis not present

## 2021-04-11 DIAGNOSIS — Z1389 Encounter for screening for other disorder: Secondary | ICD-10-CM | POA: Diagnosis not present

## 2021-04-11 DIAGNOSIS — J329 Chronic sinusitis, unspecified: Secondary | ICD-10-CM | POA: Diagnosis not present

## 2021-05-02 DIAGNOSIS — J4520 Mild intermittent asthma, uncomplicated: Secondary | ICD-10-CM | POA: Diagnosis not present

## 2021-05-05 DIAGNOSIS — J4520 Mild intermittent asthma, uncomplicated: Secondary | ICD-10-CM | POA: Diagnosis not present

## 2021-06-02 DIAGNOSIS — J4520 Mild intermittent asthma, uncomplicated: Secondary | ICD-10-CM | POA: Diagnosis not present

## 2021-06-04 DIAGNOSIS — J4520 Mild intermittent asthma, uncomplicated: Secondary | ICD-10-CM | POA: Diagnosis not present

## 2021-06-14 DIAGNOSIS — E559 Vitamin D deficiency, unspecified: Secondary | ICD-10-CM | POA: Diagnosis not present

## 2021-06-25 ENCOUNTER — Other Ambulatory Visit (INDEPENDENT_AMBULATORY_CARE_PROVIDER_SITE_OTHER): Payer: Self-pay | Admitting: Family

## 2021-06-25 DIAGNOSIS — G40309 Generalized idiopathic epilepsy and epileptic syndromes, not intractable, without status epilepticus: Secondary | ICD-10-CM

## 2021-06-25 DIAGNOSIS — G40209 Localization-related (focal) (partial) symptomatic epilepsy and epileptic syndromes with complex partial seizures, not intractable, without status epilepticus: Secondary | ICD-10-CM

## 2021-06-25 DIAGNOSIS — G4700 Insomnia, unspecified: Secondary | ICD-10-CM

## 2021-06-25 MED ORDER — DEPAKOTE 500 MG PO TBEC: DELAYED_RELEASE_TABLET | ORAL | 5 refills | Status: DC

## 2021-06-25 MED ORDER — TRAZODONE HCL 50 MG PO TABS: ORAL_TABLET | ORAL | 5 refills | Status: DC

## 2021-07-03 DIAGNOSIS — R3982 Chronic bladder pain: Secondary | ICD-10-CM | POA: Diagnosis not present

## 2021-07-03 DIAGNOSIS — J4520 Mild intermittent asthma, uncomplicated: Secondary | ICD-10-CM | POA: Diagnosis not present

## 2021-07-03 DIAGNOSIS — N390 Urinary tract infection, site not specified: Secondary | ICD-10-CM | POA: Diagnosis not present

## 2021-07-05 DIAGNOSIS — N390 Urinary tract infection, site not specified: Secondary | ICD-10-CM | POA: Diagnosis not present

## 2021-07-17 ENCOUNTER — Telehealth: Payer: Self-pay | Admitting: Cardiology

## 2021-07-17 NOTE — Telephone Encounter (Signed)
Mom asked me to call back later, was not a good time. ?Wants to discuss pt and herself re-establishing with Dr. Curt Bears. ?Aware I will call her back this week/next to discuss as this is not urgent. ?Pt agreeable to plan. ?

## 2021-07-17 NOTE — Telephone Encounter (Signed)
Patient's mother calling to speak with Sherri to give an update on herself and the patient. She states they are both patient's but she refused to give her name an date of birth to document in her own chart. She states she has never had to do that before and just wants Sherri to call her back. ?

## 2021-08-01 NOTE — Telephone Encounter (Signed)
Followed up with mom, apologized for the delay. ?Spent 20 minutes on phone with mom. ?Mom states that Lori Miles, herself and her sister all need to get in to see Dr. Elberta Fortis. ?Advised that PCP/MD needs to place a referral if they feel person needs to be seen by EP. ?Mom verbalized understanding and agreeable to plan.  ? ?

## 2021-08-06 DIAGNOSIS — J4520 Mild intermittent asthma, uncomplicated: Secondary | ICD-10-CM | POA: Diagnosis not present

## 2021-08-14 ENCOUNTER — Other Ambulatory Visit: Payer: Self-pay | Admitting: Internal Medicine

## 2021-08-27 DIAGNOSIS — H538 Other visual disturbances: Secondary | ICD-10-CM | POA: Diagnosis not present

## 2021-08-28 ENCOUNTER — Emergency Department (HOSPITAL_BASED_OUTPATIENT_CLINIC_OR_DEPARTMENT_OTHER): Payer: Medicare Other | Admitting: Radiology

## 2021-08-28 ENCOUNTER — Emergency Department (HOSPITAL_BASED_OUTPATIENT_CLINIC_OR_DEPARTMENT_OTHER)
Admission: EM | Admit: 2021-08-28 | Discharge: 2021-08-28 | Disposition: A | Discharge status: Home / Self Care | Payer: Medicare Other | Attending: Emergency Medicine | Admitting: Emergency Medicine

## 2021-08-28 ENCOUNTER — Other Ambulatory Visit: Payer: Self-pay

## 2021-08-28 ENCOUNTER — Encounter (HOSPITAL_BASED_OUTPATIENT_CLINIC_OR_DEPARTMENT_OTHER): Payer: Self-pay | Admitting: *Deleted

## 2021-08-28 DIAGNOSIS — Z711 Person with feared health complaint in whom no diagnosis is made: Secondary | ICD-10-CM | POA: Diagnosis not present

## 2021-08-28 DIAGNOSIS — R062 Wheezing: Secondary | ICD-10-CM | POA: Diagnosis not present

## 2021-08-28 DIAGNOSIS — R69 Illness, unspecified: Secondary | ICD-10-CM | POA: Diagnosis present

## 2021-08-28 DIAGNOSIS — N390 Urinary tract infection, site not specified: Secondary | ICD-10-CM | POA: Diagnosis not present

## 2021-08-28 LAB — URINALYSIS, ROUTINE W REFLEX MICROSCOPIC
Bilirubin Urine: NEGATIVE
Glucose, UA: NEGATIVE mg/dL
Hgb urine dipstick: NEGATIVE
Leukocytes,Ua: NEGATIVE
Nitrite: NEGATIVE
Specific Gravity, Urine: 1.037 — ABNORMAL HIGH (ref 1.005–1.030)
pH: 6 (ref 5.0–8.0)

## 2021-08-28 MED ORDER — ONDANSETRON 4 MG PO TBDP
4.0000 mg | ORAL_TABLET | Freq: Once | ORAL | Status: AC
Start: 2021-08-28 — End: 2021-08-28
  Administered 2021-08-28: 4 mg via ORAL
  Filled 2021-08-28: qty 1

## 2021-08-28 NOTE — ED Notes (Signed)
Pt verbalizes understanding of discharge instructions. Opportunity for questioning and answers were provided. Pt discharged from ED to home with mother.   ? ?

## 2021-08-28 NOTE — ED Triage Notes (Signed)
Pt brought in by her mother as mother thinks pt has UTI.  She had her urine checked a couple of days ago and she was negative for UTI but mother feels that she does have a UTI and would like this checked.  Mother also notes that pt has not shown any interest in playing on her ipod or with her dolls and she has been having wheezing.  Pt appears in no distress at this time.

## 2021-08-28 NOTE — ED Notes (Signed)
Hat placed on toilet and pt mother is helping pt to void in order to collect sample.  Mother is pt legal guardian

## 2021-08-28 NOTE — ED Provider Notes (Signed)
[facility] EMERGENCY DEPT  Provider Note  CSN: [id number] Arrival date & time: 08/28/21 0134  History Chief Complaint  Patient presents with   Illness    Lori Miles is a 26 y.o. female with history of developmental delay and recurrent UTI brought by mother for evaluation due to concerns of a UTI which the mother describes as malodorous urine, weakness, and lack of interest in activities that she she usually enjoys. The patient has had two recent negative urine cultures in her Urologists office but mother is stating that in the past the patient has had UTI diagnosed by glass bottle cultures (presumably blood cultures) and that she spent several days in the hospital in Dec/Jan 2022-23 for the same and the only way they found the infection was with blood tests. She reports she was told to come to the hospital by the Urology office RN. The patient has not had any reported fevers or vomiting. She has had some wheezing recently.    Home Medications Prior to Admission medications   Medication Sig Start Date End Date Taking? Authorizing Provider  acetaminophen (TYLENOL) 325 MG tablet Take 2 tablets (650 mg total) by mouth every 6 (six) hours as needed for mild pain or moderate pain. 02/27/17   Everlene Farrier, PA-C  albuterol (PROVENTIL) (2.5 MG/3ML) 0.083% nebulizer solution Take 3 mLs (2.5 mg total) by nebulization every 4 (four) hours as needed for wheezing or shortness of breath. 01/23/21   Kozlow, Alvira Philips, MD  albuterol (VENTOLIN HFA) 108 (90 Base) MCG/ACT inhaler INHALE 2 PUFFS INTO THE LUNGS EVERY 6 HOURS AS NEEDED FOR WHEEZING OR SHORTNESS OF BREATH 01/23/21   Kozlow, Alvira Philips, MD  amoxicillin-clavulanate (AUGMENTIN) 500-125 MG tablet Take 1 tablet by mouth 2 (two) times daily. 10/19/20   [provider]  benzonatate (TESSALON) 100 MG capsule Take 1 capsule (100 mg total) by mouth every 8 (eight) hours. 09/10/20   Henderly, Britni A, PA-C  BROVANA 15 MCG/2ML NEBU Take 2  mLs (15 mcg total) by nebulization in the morning and at bedtime. 03/29/21   Kozlow, Alvira Philips, MD  budesonide (PULMICORT) 1 MG/2ML nebulizer solution USE 1 VIAL VIA NEBULIZER FOUR TIMES DAILY DURING RESPITORY INFECTIONS AND ASTHMA FLARES FOR 1-2 WEEKS 01/23/21   Kozlow, Alvira Philips, MD  cephALEXin (KEFLEX) 500 MG capsule Take 500 mg by mouth 3 (three) times daily. 10/06/20   [provider]  cetirizine (ZYRTEC) 10 MG tablet Take 1 tablet (10 mg total) by mouth 2 (two) times daily as needed for allergies (Can use an extra dose during flare ups). 01/23/21   Kozlow, Alvira Philips, MD  Cholecalciferol (VITAMIN D3) 1.25 MG (50000 UT) CAPS Take 1 capsule by mouth once a week. 09/21/20   [provider]  ciprofloxacin (CIPRO) 500 MG tablet Take 500 mg by mouth 2 (two) times daily. 10/20/20   [provider]  clotrimazole-betamethasone (LOTRISONE) cream Apply to affected area 2 times daily prn 06/29/18   Elvina Sidle, MD  DEPAKOTE 500 MG DR tablet TAKE 1 TABLET BY MOUTH IN THE MORNING AND 2 TABLETS AT NIGHT 06/25/21   Elveria Rising, NP  estrogens, conjugated, (PREMARIN) 1.25 MG tablet TAKE ONE BY MOUTH TWICE PER DAY 11/07/20   Constant, Peggy, MD  famotidine (PEPCID) 20 MG tablet TAKE 1 TABLET(20 MG) BY MOUTH EVERY 12 HOURS 01/23/21   Kozlow, Alvira Philips, MD  FEROSUL 325 (65 Fe) MG tablet Take 325 mg by mouth daily. 10/01/20   [provider]  fluconazole (DIFLUCAN) 150 MG tablet Take 1 tablet (150 mg total) by mouth as needed (when on antibiotics). 11/07/20   Constant, Peggy, MD  fluticasone (FLONASE) 50 MCG/ACT nasal spray Place 2 sprays into both nostrils daily. 09/10/20   Henderly, Britni A, PA-C  formoterol (PERFOROMIST) 20 MCG/2ML nebulizer solution USE 1 VIAL VIA NEBULIZER TWICE DAILY 01/23/21   Kozlow, Donnamarie Poag, MD  hydrOXYzine (ATARAX/VISTARIL) 25 MG tablet Take 25 mg by mouth 3 (three) times daily as needed (pain).  01/13/19   [provider]  ipratropium-albuterol (DUONEB) 0.5-2.5  (3) MG/3ML SOLN Take 3 mLs by nebulization every 4 (four) hours as needed. 01/23/21   Kozlow, Donnamarie Poag, MD  melatonin 3 MG TABS tablet Take 2 tablets (6mg ) by mouth at bedtime. 01/28/20   Rockwell Germany, NP  mometasone (NASONEX) 50 MCG/ACT nasal spray Use one spray in each nostril once daily as directed. 05/09/20   Kozlow, Donnamarie Poag, MD  montelukast (SINGULAIR) 10 MG tablet TAKE 1 TABLET(10 MG) BY MOUTH AT BEDTIME 01/23/21   Kozlow, Donnamarie Poag, MD  mupirocin nasal ointment (BACTROBAN NASAL) 2 % Place 1 application into the nose 3 (three) times daily. Use  in each nostril twice daily for twenty one (21) days. Patient taking differently: Place 1 application into the nose 3 (three) times daily. Use  in each nostril twice daily for twenty one (21) days. 02/08/20   Kozlow, Donnamarie Poag, MD  Nebulizer MISC Adult mask with tubing. Use as directed with nebulizer device. 11/18/18   Kozlow, Donnamarie Poag, MD  norethindrone (AYGESTIN) 5 MG tablet Take 2 tablets (10 mg total) by mouth daily. 02/05/21   Shelly Bombard, MD  nystatin (MYCOSTATIN/NYSTOP) powder Apply 1 application topically 3 (three) times daily. 11/07/20   Constant, Peggy, MD  Olopatadine HCl 0.2 % SOLN Can use one drop in each eye once daily if needed for red, itching, watery eyes. 01/23/21   Kozlow, Donnamarie Poag, MD  ondansetron (ZOFRAN ODT) 4 MG disintegrating tablet Take 2 tablets (8 mg total) by mouth every 8 (eight) hours as needed for nausea or vomiting. 09/10/20   Henderly, Britni A, PA-C  polyethylene glycol powder (GLYCOLAX/MIRALAX) 17 GM/SCOOP powder DISSOLVE 1 CAPFUL(17 GRAMS) IN AT LEAST 8 OUNCES OF WATER/JUICE TWICE DAILY 08/14/21   Pyrtle, Lajuan Lines, MD  senna (SENOKOT) 8.6 MG tablet Take 1 tablet by mouth at bedtime.     [provider]  terconazole (TERAZOL 3) 0.8 % vaginal cream Place 1 applicator vaginally at bedtime. Apply nightly for three nights. 11/07/20   Constant, Peggy, MD  terconazole (TERAZOL 7) 0.4 % vaginal cream Place 1 applicator vaginally at  bedtime. 01/28/19   Lajean Manes, CNM  traZODone (DESYREL) 50 MG tablet TAKE 2 TABLETS(100 MG) BY MOUTH AT BEDTIME 06/25/21   Rockwell Germany, NP     Allergies    Abilify [aripiprazole], Seroquel [quetiapine fumarate], Amoxicillin-pot clavulanate, Bactrim [sulfamethoxazole-trimethoprim], Doxycycline, Macrobid [nitrofurantoin macrocrystal], and Keppra [levetiracetam]   Review of Systems   Review of Systems Please see HPI for pertinent positives and negatives  Physical Exam BP 120/72 (BP Location: Right Arm)   Pulse 96   Temp 98.5 F (36.9 C) (Oral)   Resp 16   Wt 77.6 kg   SpO2 100%   BMI 37.02 kg/m   Physical Exam Vitals and nursing note reviewed.  Constitutional:      Appearance: Normal appearance.  HENT:     Head: Normocephalic and atraumatic.     Nose: Nose normal.  Mouth/Throat:     Mouth: Mucous membranes are moist.  Eyes:     Extraocular Movements: Extraocular movements intact.     Conjunctiva/sclera: Conjunctivae normal.  Cardiovascular:     Rate and Rhythm: Normal rate.  Pulmonary:     Effort: Pulmonary effort is normal.     Breath sounds: Normal breath sounds. No wheezing, rhonchi or rales.  Abdominal:     General: Abdomen is flat.     Palpations: Abdomen is soft.     Tenderness: There is no abdominal tenderness.  Musculoskeletal:        General: No swelling. Normal range of motion.     Cervical back: Neck supple.  Skin:    General: Skin is warm and dry.  Neurological:     General: No focal deficit present.     Mental Status: She is alert. Mental status is at baseline.  Psychiatric:        Mood and Affect: Mood normal.     ED Results / Procedures / Treatments   EKG None  Procedures Procedures  Medications Ordered in the ED Medications - No data to display  Initial Impression and Plan  Patient is here with mother who reports concern for UTI. Urine here shows no signs of infection. I personally viewed the images from radiology  studies and agree with radiologist interpretation: CXR is clear. She is unconvinced by the two negative urine cultures she has had in the last 2 weeks. She is asking for additional diagnostic testing to be done but is unable to specify which tests she wants. An extensive review of the patient's EMR shows that she has never had a positive blood culture. Her last ED visit in the [facility] system was 03/04/2021 where she had only a negative urine and urine culture, no other blood tests were done and she was not admitted. This is not consistent with the mother's history but she refuses to accept that there is no record of the patient being admitted in that time frame. The patient is well appearing, alert and responsive to verbal stimuli. She has normal vitals, no apparent rash, no wheezing or other respiratory symptoms. The patient does not have an apparent emergent medical condition. I do not see any indication for further ED testing at this time. Patient will be discharged, advised to follow up with PCP for further evaluation if mother's concerns persist.   ED Course       MDM Rules/Calculators/A&P Medical Decision Making Problems Addressed: Physically well but worried: acute illness or injury  Amount and/or Complexity of Data Reviewed Labs: ordered. Decision-making details documented in ED Course. Radiology: ordered and independent interpretation performed. Decision-making details documented in ED Course.    Final Clinical Impression(s) / ED Diagnoses Final diagnoses:  Physically well but worried    Rx / DC Orders ED Discharge Orders     None        Truddie Hidden, MD 08/28/21 [phone]

## 2021-08-28 NOTE — ED Notes (Signed)
Patient transported to X-ray 

## 2021-08-29 ENCOUNTER — Telehealth: Payer: Self-pay | Admitting: Allergy and Immunology

## 2021-08-29 NOTE — Telephone Encounter (Signed)
Patient mom call and said that she took her to urgent care yesterday and gave her cough meds and forgot to give her the prednisone . She is very congested and need the prednisone. Appointment is 11/13/2021. With you . [phone].

## 2021-09-01 DIAGNOSIS — J320 Chronic maxillary sinusitis: Secondary | ICD-10-CM | POA: Diagnosis not present

## 2021-09-01 DIAGNOSIS — J209 Acute bronchitis, unspecified: Secondary | ICD-10-CM | POA: Diagnosis not present

## 2021-09-01 DIAGNOSIS — R051 Acute cough: Secondary | ICD-10-CM | POA: Diagnosis not present

## 2021-09-03 DIAGNOSIS — J4520 Mild intermittent asthma, uncomplicated: Secondary | ICD-10-CM | POA: Diagnosis not present

## 2021-09-04 ENCOUNTER — Other Ambulatory Visit (INDEPENDENT_AMBULATORY_CARE_PROVIDER_SITE_OTHER): Payer: Self-pay | Admitting: Family

## 2021-09-04 DIAGNOSIS — F840 Autistic disorder: Secondary | ICD-10-CM

## 2021-09-04 DIAGNOSIS — G40309 Generalized idiopathic epilepsy and epileptic syndromes, not intractable, without status epilepticus: Secondary | ICD-10-CM

## 2021-09-04 DIAGNOSIS — R296 Repeated falls: Secondary | ICD-10-CM

## 2021-09-04 DIAGNOSIS — F819 Developmental disorder of scholastic skills, unspecified: Secondary | ICD-10-CM

## 2021-09-04 DIAGNOSIS — R269 Unspecified abnormalities of gait and mobility: Secondary | ICD-10-CM

## 2021-09-04 NOTE — Telephone Encounter (Signed)
[name] called back to speak to nurse that called her to follow up previous message from 08-29-2021.   I spoke to Dr. Lucie Leather who stated he would not give her any medication until she is seen in office. I asked mom if she wanted to schedule an appointment. Mom denied stating she did not want to bring pt in due to her being sick. Mom stated she had let the nurse know she could not bring pt in due to pt being sick and mom hurt her leg in February - which makes it difficult to bring patient in.   I advised mom Dr. Lucie Leather was firm in wanting to see patient before he sends in anything. Mom stated she will not bring in patient and if she did Dr. Lucie Leather would need to meet her and patient outside to complete visit. Mom then stated patient does have the prednisone but she needs more - whatever she was given by Urgent care was not enough. Mom states eagle should be sending a note to Dr. Lucie Leather as pt was just seen 2 days ago and that should be enough for him to give pt more prednisone.   Please advise.

## 2021-09-04 NOTE — Telephone Encounter (Signed)
I called the patient she is scheduled for 10/09/21 at 3:40 with Dr. Lucie Leather for further evaluation. She requested that no one else call her back.

## 2021-09-04 NOTE — Telephone Encounter (Signed)
I called the patient about needing prednisone. She was given prednisone from the urgent care clinic, but the patient's mother stated she was not given enough prednisone. Before we could go over the prescribed dose she told me she had to call me back. I did try to get her to call the urgent care clinic so they can discuss/ resolve the prednisone issue. The patient's mother said they will not send in more prednisone.

## 2021-09-04 NOTE — Telephone Encounter (Signed)
I called the patient's mother back. She will try making an appointment for earlier than August to be evaluated I have to call back after lunch she is currently at a doctor's appointment.

## 2021-09-24 ENCOUNTER — Ambulatory Visit: Payer: Self-pay | Admitting: Physical Therapy

## 2021-09-24 ENCOUNTER — Other Ambulatory Visit: Payer: Self-pay | Admitting: Allergy and Immunology

## 2021-09-24 ENCOUNTER — Telehealth: Payer: Self-pay

## 2021-09-24 MED ORDER — BUDESONIDE 1 MG/2ML IN SUSP: RESPIRATORY_TRACT | 0 refills | Status: DC

## 2021-09-24 MED ORDER — FORMOTEROL FUMARATE 20 MCG/2ML IN NEBU: INHALATION_SOLUTION | RESPIRATORY_TRACT | 0 refills | Status: DC

## 2021-09-24 NOTE — Telephone Encounter (Signed)
Patient's mother, [name] - DOB/Pharmacy verified - Called in requesting medication refills on Perforomist and Pulmicort nebulizer solutions. Mother was advised LOV: 01/23/21 F/U 12 weeks, which is why medication refills were being denied.   Mother advised patient keep 10/09/21 @ 3:40 pm follow up appt w/ Dr. Lucie Leather for any future refills. Mother advised due to her falling a lot and having to have physical therapy, she may or may not be able to make/keep appt if there's an emergency due to her falling. Reemphasized to mother, I understand but in order for any future medication refills patient must keep follow up appointment.   Mother verbalized understanding, no further questions.  Electronically sent in medications to pharmacy.

## 2021-09-25 DIAGNOSIS — N390 Urinary tract infection, site not specified: Secondary | ICD-10-CM | POA: Diagnosis not present

## 2021-09-29 DIAGNOSIS — R350 Frequency of micturition: Secondary | ICD-10-CM | POA: Diagnosis not present

## 2021-09-29 DIAGNOSIS — N3000 Acute cystitis without hematuria: Secondary | ICD-10-CM | POA: Diagnosis not present

## 2021-10-01 ENCOUNTER — Ambulatory Visit: Payer: Medicare Other | Attending: Family | Admitting: Physical Therapy

## 2021-10-01 ENCOUNTER — Encounter: Payer: Self-pay | Admitting: Physical Therapy

## 2021-10-01 DIAGNOSIS — R269 Unspecified abnormalities of gait and mobility: Secondary | ICD-10-CM | POA: Insufficient documentation

## 2021-10-01 DIAGNOSIS — R2689 Other abnormalities of gait and mobility: Secondary | ICD-10-CM | POA: Diagnosis not present

## 2021-10-01 DIAGNOSIS — F840 Autistic disorder: Secondary | ICD-10-CM | POA: Insufficient documentation

## 2021-10-01 DIAGNOSIS — G40309 Generalized idiopathic epilepsy and epileptic syndromes, not intractable, without status epilepticus: Secondary | ICD-10-CM | POA: Diagnosis not present

## 2021-10-01 DIAGNOSIS — R296 Repeated falls: Secondary | ICD-10-CM | POA: Insufficient documentation

## 2021-10-01 DIAGNOSIS — M6281 Muscle weakness (generalized): Secondary | ICD-10-CM | POA: Diagnosis not present

## 2021-10-01 DIAGNOSIS — F819 Developmental disorder of scholastic skills, unspecified: Secondary | ICD-10-CM | POA: Insufficient documentation

## 2021-10-01 DIAGNOSIS — R2681 Unsteadiness on feet: Secondary | ICD-10-CM | POA: Insufficient documentation

## 2021-10-01 NOTE — Therapy (Signed)
OUTPATIENT PHYSICAL THERAPY NEURO EVALUATION   Patient Name: Lori Miles MRN: [id number] DOB:01-25-1996, 26 y.o., female Today's Date: 10/01/2021   PCP: Clementeen Graham, PA-C REFERRING PROVIDER: Elveria Rising, NP    PT End of Session - 10/01/21 1942     Visit Number 1    Number of Visits 7    Date for PT Re-Evaluation 11/23/21    Authorization Type [employer] Medicare    Authorization Time Period 10-01-21 - 12-15-21    PT Start Time 0758    PT Stop Time 0845    PT Time Calculation (min) 47 min    Activity Tolerance Patient tolerated treatment well    Behavior During Therapy [facility] for tasks assessed/performed             Past Medical History:  Diagnosis Date   Asthma    Autism    mom states no actual dx, but admits she has some symptoms   Bacteriuria    Chronic constipation    Cognitive developmental delay    Constipation    COVID-19 03/2020   Disruptive behavior disorder    Dyspnea    if she walks much   Family history of adverse reaction to anesthesia     "hives"-? if anesthesia did take antibiotic   Frequency-urgency syndrome    Gait disorder    ORGANIC PER NERUOLGIST NOTE (DR HICKLING)   Generalized convulsive epilepsy (HCC) NEUROLOGIST-  DR HICKLING   GERD (gastroesophageal reflux disease)    Gout    H/O sinus tachycardia    History of acute respiratory failure 02/14/2015   SECONDARY TO CAP   History of recurrent UTIs    Incontinent of feces    Interstitial cystitis    Intertrigo 11/29/2019   Moderate intellectual disability    OSA (obstructive sleep apnea)    no machine yet    Partial epilepsy with impairment of consciousness (HCC)    FOLLOWED BY DR HICKLING   Pneumonia 2    4 times this year ( 2020)   PONV (postoperative nausea and vomiting)    Renal cyst 11/29/2019   Seizure (HCC)    most recent sz " at the end of summer"-last sz years ago per mother   Urinary incontinence    Past Surgical History:  Procedure Laterality Date   CYSTOSCOPY  N/A 06/03/2017   Procedure: CYSTOSCOPY WITH EXAM UNDER ANESTHESIA;  Surgeon: Jerilee Field, MD;  Location: [facility];  Service: Urology;  Laterality: N/A;   CYSTOSCOPY  2020   CYSTOSCOPY WITH HYDRODISTENSION AND BIOPSY  04/14/2012   Procedure: CYSTOSCOPY/BIOPSY/HYDRODISTENSION;  Surgeon: Lindaann Slough, MD;  Location: [facility];  Service: Urology;;  instillation of marcaine and pyridium   EUA/  PAP SMEAR/  BREAST EXAM  03-12-2016   dr Erin Fulling [facility]   EUA/ VAGINOSCOPY/ COLPOSCOPY FO THE HYMENAL RING  10-04-2009    dr Tamela Oddi  [facility]   IR RADIOLOGIST EVAL & MGMT  07/29/2019   IR RADIOLOGIST EVAL & MGMT  09/30/2019   IR SCLEROTHERAPY OF A FLUID COLLECTION  09/08/2019   RADIOLOGY WITH ANESTHESIA N/A 09/08/2019   Procedure: IR Sclerotherapy Treatment;  Surgeon: Radiologist, Medication, MD;  Location: [facility] OR;  Service: Radiology;  Laterality: N/A;   TONSILLECTOMY     Patient Active Problem List   Diagnosis Date Noted   Encounter for long-term (current) use of high-risk medication 12/29/2020   Menorrhagia 12/16/2020   Renal cyst 11/29/2019   Intertrigo 11/29/2019   Complex care coordination 05/18/2019  Screening for cervical cancer 02/26/2019   Glucosuria 06/01/2018   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   Not well controlled moderate persistent asthma 04/29/2018   Gastroesophageal reflux disease 04/29/2018   Recurrent infections 04/29/2018   Dysuria 02/20/2018   Chronic rhinitis 04/03/2017   Recurrent falls 03/21/2017   Weakness 03/21/2017   Venous insufficiency 03/21/2017   OSA on CPAP 09/30/2016   Excessive daytime sleepiness 03/05/2016   Insomnia 10/04/2015   Bronchitis 05/05/2015   Abnormality of gait 03/22/2015   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   Cough variant asthma vs UACS/ vcd  03/15/2015   Pneumonia 02/14/2015   Autism spectrum disorder 02/14/2015   Nausea with vomiting 02/14/2015   Obesity, morbid (HCC) 12/13/2014    Mental retardation, 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   Chronic constipation 08/01/2011   Contraception 08/01/2011    ONSET DATE: Referral date 09-05-21  REFERRING DIAG: Diagnosis G40.309 (ICD-10-CM) - Generalized convulsive epilepsy (HCC) F81.9 (ICD-10-CM) - Cognitive developmental delay E66.01 (ICD-10-CM) - Obesity, morbid (HCC) F84.0 (ICD-10-CM) - Autism spectrum disorder R26.9 (ICD-10-CM) - Abnormality of gait R29.6 (ICD-10-CM) - Recurrent falls   THERAPY DIAG:  Other abnormalities of gait and mobility - Plan: PT plan of care cert/re-cert  Muscle weakness (generalized) - Plan: PT plan of care cert/re-cert  Unsteadiness on feet - Plan: PT plan of care cert/re-cert  Rationale for Evaluation and Treatment Rehabilitation  SUBJECTIVE:                                                                                                                                                                                              SUBJECTIVE STATEMENT: [name] states not a lot of changes since D/C from OP PT in Jan. Of this year; states her feet still swell at times; Lt leg still gives way at times with prolonged ambulation and she will fall if not able to sit down somewhere quickly Pt accompanied by:  mother, [name]  PERTINENT HISTORY: Autism spectrum disorder, h/o generalized convulsive epilepsy, steroid induced diabetes, h/o seizures, moderate intellectual disability  PAIN:  Are you having pain? No - pt does not respond when asks if she has pain; mother states she has swelling in both legs  PRECAUTIONS:  None  WEIGHT BEARING RESTRICTIONS No  FALLS: Has patient fallen  in last 6 months? No  LIVING ENVIRONMENT: Lives with: lives with their family Lives in: House/apartment Stairs: No Has following equipment at home: Environmental consultant - 4 wheeled, Wheelchair (manual), and Electronics engineer  PLOF: Independent with household mobility with device, Needs assistance with ADLs, and Needs assistance with homemaking  Needs assistance with community ambulation (supervision needed due to decreased cognition)  PATIENT GOALS increase activity to increase strength; improve balance and endurance  OBJECTIVE:   DIAGNOSTIC FINDINGS: N/A  COGNITION: Overall cognitive status: Impaired - developmental delay   SENSATION: WFL  COORDINATION: WFL's bil. LE's   POSTURE: rounded shoulders and forward head  LOWER EXTREMITY ROM:   WFL's bil. LE's   LOWER EXTREMITY MMT:  accurate assessment is questionable due to decreased cognition and decreased effort exhibited during testing  MMT Right Eval Left Eval  Hip flexion 3+ 3+  Hip extension    Hip abduction    Hip adduction    Hip internal rotation    Hip external rotation    Knee flexion 3+ 3+  Knee extension 4 3+  Ankle dorsiflexion 4 4-  Ankle plantarflexion    Ankle inversion    Ankle eversion    (Blank rows = not tested)  BED MOBILITY:  Independent  TRANSFERS: Assistive device utilized: None  Sit to stand: SBA Stand to sit: SBA   STAIRS:  Level of Assistance:  SBA  Stair Negotiation Technique: Step to Pattern Alternating Pattern  with Bilateral Rails  Number of Stairs: 4   Height of Stairs: 6  Comments: pt ascended steps using step over step sequence and descended steps using step by step  GAIT: Gait pattern: step through pattern; LLE internally rotated with increased supination noted in Lt foot in stance Distance walked: 230' in 1" 37 secs Assistive device utilized: None Level of assistance: SBA Comments: see above Gait velocity:  13.6  secs = 2.41 ft/sec without device  FUNCTIONAL TESTs:  5 times sit to stand: 1x without UE support; 3 reps total - pt used UE support on reps 2 and 3; did not complete 5 reps Timed up and go (TUG): 15.91 secs without device  Pt unable to perform SLS on either RLE or  LLE Pt stepped forward with RLE - held 7.93 secs Pt able to pick up object off floor and return to standing without LOB Pt would not attempt standing with feet together in today's eval  TODAY'S TREATMENT:  Pt rode SciFIt level 2.0 x 5" with use of bil. UE's and LE's   PATIENT EDUCATION: Education details: eval results with POC of 6 weeks Person educated: Parent Education method: Explanation Education comprehension: verbalized understanding   HOME EXERCISE PROGRAM: To be issued    GOALS: Goals reviewed with patient? Yes  SHORT TERM GOALS: Target date: 10/22/2021 (3 weeks)  Amb. 460' nonstop without device with SBA to demo increased endurance. Baseline: 230' with SBA Goal status: INITIAL  2.  Improve TUG score to </= 13.5 secs without device to decrease fall risk.  Baseline: 15.91 secs Goal status: INITIAL  3.  Perform HEP for balance and strengthening with mother's assist.  Baseline:  Goal status: INITIAL   LONG TERM GOALS: Target date: 11/12/2021 - 6 weeks  Pt will amb. 600' without device on flat, even surface without LOB with SBA. Baseline: 230'  Goal status: INITIAL  2.  Perform at least 10" on recumbent bike nonstop for improved endurance/activity.  Baseline: 3 1/2" prior to resting with doing SciFit exercise Goal status:  INITIAL  3.  Improve TUG score to </= 11.5 secs without device to decrease fall risk.  Baseline: 15.91 secs without device Baseline:  Goal status: INITIAL  4.  Pt will perform sit to stand 5 times without use of UE support from mat table. Baseline: 1 rep without UE support at initial eval Goal status: INITIAL  5.  Provide updated HEP as appropriate to be performed with  mother's assist. Baseline:  Goal status: INITIAL  ASSESSMENT:  CLINICAL IMPRESSION: Patient is a 26 y.o. female  who was seen today for physical therapy evaluation and treatment for gait abnormality.  PMH includes autism, developmental delay and h/o seizures/epilepsy.  Pt exhibits abnormal gait pattern with LLE internally rotated in stance, decreased push off of LLE in stance due to weakness in Lt plantarflexors.  Pt also demonstrates decreased balance wit inability to perform SLS on either leg and TUG score 15.91 secs, indicative of fall risk.  Pt also demonstrates decreased activity tolerance/ decreased endurance as she amb. 230' in 1" 37 secs prior to requesting a seated rest period.  Pt will benefit from PT to address gait & balance deficits and LE weakness.      OBJECTIVE IMPAIRMENTS Abnormal gait, decreased activity tolerance, decreased balance, decreased cognition, decreased endurance, and decreased strength.   ACTIVITY LIMITATIONS carrying, lifting, bending, squatting, stairs, and locomotion level  PARTICIPATION LIMITATIONS: meal prep, cleaning, laundry, and shopping  PERSONAL FACTORS Behavior pattern, Fitness, Past/current experiences, Time since onset of injury/illness/exacerbation, and 1 comorbidity: Behavior patterns/cognition  are also affecting patient's functional outcome.   REHAB POTENTIAL: Fair   CLINICAL DECISION MAKING: Stable/uncomplicated  EVALUATION COMPLEXITY: Low  PLAN: PT FREQUENCY: 1x/week  PT DURATION: 6 weeks  PLANNED INTERVENTIONS: Therapeutic exercises, Therapeutic activity, Neuromuscular re-education, Balance training, Gait training, Patient/Family education, Self Care, and Stair training  PLAN FOR NEXT SESSION: LE strengthening and balance training   Marsean Elkhatib, [place], PT 10/01/2021, 8:46 PM

## 2021-10-02 ENCOUNTER — Telehealth: Payer: Self-pay | Admitting: Obstetrics and Gynecology

## 2021-10-02 DIAGNOSIS — J4520 Mild intermittent asthma, uncomplicated: Secondary | ICD-10-CM | POA: Diagnosis not present

## 2021-10-04 ENCOUNTER — Ambulatory Visit (INDEPENDENT_AMBULATORY_CARE_PROVIDER_SITE_OTHER): Payer: Medicare Other | Admitting: Adult Health

## 2021-10-04 ENCOUNTER — Encounter: Payer: Self-pay | Admitting: Adult Health

## 2021-10-04 DIAGNOSIS — J45991 Cough variant asthma: Secondary | ICD-10-CM

## 2021-10-04 DIAGNOSIS — J310 Chronic rhinitis: Secondary | ICD-10-CM

## 2021-10-04 NOTE — Assessment & Plan Note (Signed)
Cough variant asthma complicated by chronic rhinitis. Patient is prone to recurrent flares per mother.  Currently today seems to be at baseline.  With no exacerbation of symptoms.  Exam is unrevealing. Would continue on maintenance nebulizers with budesonide and Perforomist.  Asthma action plan discussed.  Albuterol inhaler and nebulizer as needed.  Continue on trigger prevention with Singulair and Zyrtec daily. Saline nasal rinses as needed  Plan  Patient Instructions  Mucinex DM Twice daily  As needed  Cough/congestion  Saline nasal spray and gel. As needed   Continue on Pulmicort and Perforomist twice daily, rinse after use  Continue on Singulair and Zyrtec daily  Use albuterol nebulizer or inhaler as needed  Follow up with Dr. Craige Cotta  or Fey Coghill NP in 1 year and As needed   Please contact office for sooner follow up if symptoms do not improve or worsen or seek emergency care

## 2021-10-04 NOTE — Patient Instructions (Addendum)
Mucinex DM Twice daily  As needed  Cough/congestion  Saline nasal spray and gel. As needed   Continue on Pulmicort and Perforomist twice daily, rinse after use  Continue on Singulair and Zyrtec daily  Use albuterol nebulizer or inhaler as needed  Follow up with Dr. Craige Cotta  or Patrina Andreas NP in 1 year and As needed   Please contact office for sooner follow up if symptoms do not improve or worsen or seek emergency care

## 2021-10-04 NOTE — Progress Notes (Signed)
_0  ID: Lori Miles, female    DOB: [birth year], 26 y.o.   MRN: [id number]  Chief Complaint  Patient presents with   Follow-up    Referring provider: No ref. provider found  HPI: 26 year old female never smoker followed for cough variant asthma and allergic rhinitis  Complicated medical history includes cerebral palsy, mental insufficiency with severe behavioral issues and autism, seizure disorder, chronic UTIs Patient has hypersomnia with multiple negative sleep studies for sleep apnea and narcolepsy [facility] Dr. Neldon Mc   TEST/EVENTS :  PSG 09/22/18 >> total sleep time 367.5 min.  AHI 0.7, SpO2 low 89%, PLMI 0.  Took trazodone and melatonin. MSLT 09/23/18 >> sleep onset in 5 of 5 naps with mean sleep latency 29 seconds.  1 of 5 naps with SOREM.      -Sleep study January 06, 2015 at [place] sleep study at [facility] AHI 0.4 (total sleep time 428 minutes) moderate to loud snoring -Sleep study March 27, 2016 AHI 0.1 SaO2 low 90%, positive snoring -Sleep study December 10, 2017 AHI 0.3, SaO2 low 93% (sleep time 413 minutes) positive snoring   -A CPAP titration study was ordered June 2018 with known negative diagnostic sleep study with elimination of snoring on CPAP 10 cm H2O total sleep time 367 minutes with sleep efficiency 98%  10/04/2021 Follow up ; [facility]  Patient presents for a follow-up visit.  Last seen in the office January 20, 2020.  Patient has underlying cough variant asthma.  Is maintained on budesonide and Perforomist nebulizers twice daily.  She takes Singulair and Zyrtec daily.  Patient is accompanied by her mother who is her caregiver. She says that Tannis is very prone to recurrent bronchitis and is on antibiotics frequently.  Also has complicated medical history with chronic urinary tract infections.  She says whenever she has a UTI it seems to cause flare in her asthma and other medical  issues. Chest x-ray last month showed clear lungs.  She was last on antibiotics 3 weeks ago for a bronchitis says that she was treated at a local urgent care.  Symptoms have improved.  Currently no fever or discolored mucus.  Has daily nasal congestion and drainage.  Currently on Zyrtec and Singulair.   Allergies  Allergen Reactions   Abilify [Aripiprazole] Swelling and Palpitations   Seroquel [Quetiapine Fumarate] Palpitations   Amoxicillin-Pot Clavulanate Hives and Other (See Comments)    Has patient had a PCN reaction causing immediate rash, facial/tongue/throat swelling, SOB or lightheadedness with hypotension: No Has patient had a PCN reaction causing severe rash involving mucus membranes or skin necrosis:    #  #  YES  #  #  Has patient had a PCN reaction that required hospitalization: No Has patient had a PCN reaction occurring within the last 10 years: No    Bactrim [Sulfamethoxazole-Trimethoprim] Other (See Comments)    Abdominal pain   Doxycycline Other (See Comments)    Stomach cramps   Macrobid [Nitrofurantoin Macrocrystal] Other (See Comments)    Sharp pain/ given with Tylenol 325 mg   Keppra [Levetiracetam] Other (See Comments)    Aggressive behavior     Immunization History  Administered Date(s) Administered   DTaP 02/06/1995, 04/30/1995, 07/02/1995, 01/14/1996, 10/10/1998   Hepatitis B, ped/adol August 29, 1994, 02/06/1995, 01/14/1996   HiB (PRP-T) 02/06/1995, 04/30/1995, 07/02/1995, 01/14/1996   IPV 02/06/1995, 04/30/1995, 07/02/1995, 01/14/1996, 10/10/1998   MMR 01/14/1996, 10/10/1998   Pneumococcal Polysaccharide-23 08/06/2016, 03/07/2020   Tdap  03/03/2017   Varicella 01/14/1996    Past Medical History:  Diagnosis Date   Asthma    Autism    mom states no actual dx, but admits she has some symptoms   Bacteriuria    Chronic constipation    Cognitive developmental delay    Constipation    COVID-19 03/2020   Disruptive behavior disorder    Dyspnea    if she  walks much   Family history of adverse reaction to anesthesia     "hives"-? if anesthesia did take antibiotic   Frequency-urgency syndrome    Gait disorder    ORGANIC PER NERUOLGIST NOTE (DR HICKLING)   Generalized convulsive epilepsy ([place]) NEUROLOGIST-  DR HICKLING   GERD (gastroesophageal reflux disease)    Gout    H/O sinus tachycardia    History of acute respiratory failure 02/14/2015   SECONDARY TO CAP   History of recurrent UTIs    Incontinent of feces    Interstitial cystitis    Intertrigo 11/29/2019   Moderate intellectual disability    OSA (obstructive sleep apnea)    no machine yet    Partial epilepsy with impairment of consciousness ([place])    FOLLOWED BY DR HICKLING   Pneumonia 2    4 times this year ( 2020)   PONV (postoperative nausea and vomiting)    Renal cyst 11/29/2019   Seizure ([place])    most recent sz " at the end of summer"-last sz years ago per mother   Urinary incontinence     Tobacco History: Social History   Tobacco Use  Smoking Status Never  Smokeless Tobacco Never   Counseling given: Not Answered   Outpatient Medications Prior to Visit  Medication Sig Dispense Refill   acetaminophen (TYLENOL) 325 MG tablet Take 2 tablets (650 mg total) by mouth every 6 (six) hours as needed for mild pain or moderate pain. 60 tablet 0   albuterol (PROVENTIL) (2.5 MG/3ML) 0.083% nebulizer solution Take 3 mLs (2.5 mg total) by nebulization every 4 (four) hours as needed for wheezing or shortness of breath. 150 mL 0   albuterol (VENTOLIN HFA) 108 (90 Base) MCG/ACT inhaler INHALE 2 PUFFS INTO THE LUNGS EVERY 6 HOURS AS NEEDED FOR WHEEZING OR SHORTNESS OF BREATH 54 g 0   benzonatate (TESSALON) 100 MG capsule Take 1 capsule (100 mg total) by mouth every 8 (eight) hours. 21 capsule 0   budesonide (PULMICORT) 1 MG/2ML nebulizer solution USE 1 VIAL VIA NEBULIZER FOUR TIMES DAILY DURING RESPITORY INFECTIONS AND ASTHMA FLARES FOR 1-2 WEEKS 30 mL 0   cetirizine (ZYRTEC) 10 MG  tablet Take 1 tablet (10 mg total) by mouth 2 (two) times daily as needed for allergies (Can use an extra dose during flare ups). 60 tablet 5   Cholecalciferol (VITAMIN D3) 1.25 MG (50000 UT) CAPS Take 1 capsule by mouth once a week.     DEPAKOTE 500 MG DR tablet TAKE 1 TABLET BY MOUTH IN THE MORNING AND 2 TABLETS AT NIGHT 90 tablet 5   famotidine (PEPCID) 20 MG tablet TAKE 1 TABLET(20 MG) BY MOUTH EVERY 12 HOURS 180 tablet 3   FEROSUL 325 (65 Fe) MG tablet Take 325 mg by mouth daily.     fluconazole (DIFLUCAN) 150 MG tablet Take 1 tablet (150 mg total) by mouth as needed (when on antibiotics). 30 tablet 3   formoterol (PERFOROMIST) 20 MCG/2ML nebulizer solution USE 1 VIAL VIA NEBULIZER TWICE DAILY 40 mL 0   hydrOXYzine (ATARAX/VISTARIL) 25 MG  tablet Take 25 mg by mouth 3 (three) times daily as needed (pain).      ipratropium-albuterol (DUONEB) 0.5-2.5 (3) MG/3ML SOLN Take 3 mLs by nebulization every 4 (four) hours as needed. 360 mL 2   melatonin 3 MG TABS tablet Take 2 tablets (17m) by mouth at bedtime. 60 tablet 0   montelukast (SINGULAIR) 10 MG tablet TAKE 1 TABLET(10 MG) BY MOUTH AT BEDTIME 30 tablet 5   Nebulizer MISC Adult mask with tubing. Use as directed with nebulizer device. 4 each 12   nystatin (MYCOSTATIN/NYSTOP) powder Apply 1 application topically 3 (three) times daily. 15 g 6   Olopatadine HCl 0.2 % SOLN Can use one drop in each eye once daily if needed for red, itching, watery eyes. 2.5 mL 5   ondansetron (ZOFRAN ODT) 4 MG disintegrating tablet Take 2 tablets (8 mg total) by mouth every 8 (eight) hours as needed for nausea or vomiting. 20 tablet 0   polyethylene glycol powder (GLYCOLAX/MIRALAX) 17 GM/SCOOP powder DISSOLVE 1 CAPFUL(17 GRAMS) IN AT LEAST 8 OUNCES OF WATER/JUICE TWICE DAILY 1020 g 2   SEASONIQUE 0.15-0.03 &0.01 MG tablet Take 1 tablet by mouth daily.     senna (SENOKOT) 8.6 MG tablet Take 1 tablet by mouth at bedtime.      terconazole (TERAZOL 3) 0.8 % vaginal cream  Place 1 applicator vaginally at bedtime. Apply nightly for three nights. 20 g 6   terconazole (TERAZOL 7) 0.4 % vaginal cream Place 1 applicator vaginally at bedtime. 45 g 2   traZODone (DESYREL) 50 MG tablet TAKE 2 TABLETS(100 MG) BY MOUTH AT BEDTIME 62 tablet 5   cephALEXin (KEFLEX) 500 MG capsule Take 500 mg by mouth 3 (three) times daily. (Patient not taking: Reported on 10/04/2021)     amoxicillin-clavulanate (AUGMENTIN) 500-125 MG tablet Take 1 tablet by mouth 2 (two) times daily. (Patient not taking: Reported on 10/04/2021)     BROVANA 15 MCG/2ML NEBU Take 2 mLs (15 mcg total) by nebulization in the morning and at bedtime. (Patient not taking: Reported on 10/04/2021) 120 mL 5   ciprofloxacin (CIPRO) 500 MG tablet Take 500 mg by mouth 2 (two) times daily. (Patient not taking: Reported on 10/04/2021)     clotrimazole-betamethasone (LOTRISONE) cream Apply to affected area 2 times daily prn (Patient not taking: Reported on 10/04/2021) 45 g 0   estrogens, conjugated, (PREMARIN) 1.25 MG tablet TAKE ONE BY MOUTH TWICE PER DAY (Patient not taking: Reported on 10/04/2021) 20 tablet 3   fluticasone (FLONASE) 50 MCG/ACT nasal spray Place 2 sprays into both nostrils daily. (Patient not taking: Reported on 10/04/2021) 11.1 mL 0   mometasone (NASONEX) 50 MCG/ACT nasal spray Use one spray in each nostril once daily as directed. (Patient not taking: Reported on 10/04/2021) 17 g 1   mupirocin nasal ointment (BACTROBAN NASAL) 2 % Place 1 application into the nose 3 (three) times daily. Use  in each nostril twice daily for twenty one (21) days. (Patient not taking: Reported on 10/04/2021) 70 g 0   norethindrone (AYGESTIN) 5 MG tablet Take 2 tablets (10 mg total) by mouth daily. (Patient not taking: Reported on 10/04/2021) 60 tablet 2   No facility-administered medications prior to visit.     Review of Systems:   Constitutional:   No  weight loss, night sweats,  Fevers, chills, fatigue, or  lassitude.  HEENT:   No  headaches,  Difficulty swallowing,  Tooth/dental problems, or  Sore throat,  No sneezing, itching, ear ache,  +nasal congestion, post nasal drip,   CV:  No chest pain,  Orthopnea, PND, swelling in lower extremities, anasarca, dizziness, palpitations, syncope.   GI  No heartburn, indigestion, abdominal pain, nausea, vomiting, diarrhea, change in bowel habits, loss of appetite, bloody stools.   Resp:  No chest wall deformity  Skin: no rash or lesions.  GU: no dysuria, change in color of urine, no urgency or frequency.  No flank pain, no hematuria   MS:  No joint pain or swelling.  No decreased range of motion.  No back pain.    Physical Exam  BP 90/60 (BP Location: Left Arm, Patient Position: Sitting, Cuff Size: Large)   Pulse (!) 108   Temp 99.3 F (37.4 C) (Oral)   Ht 4' 9.5" (1.461 m)   Wt 150 lb (68 kg)   SpO2 97%   BMI 31.90 kg/m   GEN: A/Ox3; pleasant , NAD, well nourished , non verbal    HEENT:  [place]/AT,   NOSE-clear, THROAT-clear drainage , no lesions, no postnasal drip or exudate noted.   NECK:  Supple w/ fair ROM; no JVD; normal carotid impulses w/o bruits; no thyromegaly or nodules palpated; no lymphadenopathy.    RESP  Clear  P & A; w/o, wheezes/ rales/ or rhonchi. no accessory muscle use, no dullness to percussion  CARD:  RRR, no m/r/g, no peripheral edema, pulses intact, no cyanosis or clubbing.  GI:   Soft & nt; nml bowel sounds; no organomegaly or masses detected.   Musco: Warm bil, no deformities or joint swelling noted.   Neuro: alert, no focal deficits noted.    Skin: Warm, no lesions or rashes    Lab Results:  CBC    Component Value Date/Time   WBC 6.1 03/26/2020 2052   RBC 4.02 03/26/2020 2052   HGB 12.4 03/26/2020 2052   HCT 37.5 03/26/2020 2052   PLT 191 03/26/2020 2052   MCV 93.3 03/26/2020 2052   MCH 30.8 03/26/2020 2052   MCHC 33.1 03/26/2020 2052   RDW 14.2 03/26/2020 2052   LYMPHSABS 1.9 03/26/2020 2052   MONOABS  1.2 (H) 03/26/2020 2052   EOSABS 0.0 03/26/2020 2052   BASOSABS 0.0 03/26/2020 2052    BMET    Component Value Date/Time   NA 137 03/26/2020 2052   K 3.7 03/26/2020 2052   CL 104 03/26/2020 2052   CO2 22 03/26/2020 2052   GLUCOSE 88 03/26/2020 2052   BUN 12 03/26/2020 2052   CREATININE 0.91 03/26/2020 2052   CREATININE 0.72 11/29/2019 0851   CALCIUM 8.9 03/26/2020 2052   GFRNONAA >60 03/26/2020 2052   GFRNONAA 116 11/29/2019 0851   GFRAA 135 11/29/2019 0851    BNP    Component Value Date/Time   BNP 16.1 10/24/2014 0745    ProBNP    Component Value Date/Time   PROBNP 9.0 05/05/2015 1604    Imaging: No results found.        No data to display          No results found for: "NITRICOXIDE"      Assessment & Plan:   Cough variant asthma vs UACS/ vcd  Cough variant asthma complicated by chronic rhinitis. Patient is prone to recurrent flares per mother.  Currently today seems to be at baseline.  With no exacerbation of symptoms.  Exam is unrevealing. Would continue on maintenance nebulizers with budesonide and Perforomist.  Asthma action plan discussed.  Albuterol inhaler and nebulizer as  needed.  Continue on trigger prevention with Singulair and Zyrtec daily. Saline nasal rinses as needed  Plan  Patient Instructions  Mucinex DM Twice daily  As needed  Cough/congestion  Saline nasal spray and gel. As needed   Continue on Pulmicort and Perforomist twice daily, rinse after use  Continue on Singulair and Zyrtec daily  Use albuterol nebulizer or inhaler as needed  Follow up with Dr. Halford Chessman  or Xander Jutras NP in 1 year and As needed   Please contact office for sooner follow up if symptoms do not improve or worsen or seek emergency care      Chronic rhinitis Continue on current regimen.  Plan  Patient Instructions  Mucinex DM Twice daily  As needed  Cough/congestion  Saline nasal spray and gel. As needed   Continue on Pulmicort and Perforomist twice daily,  rinse after use  Continue on Singulair and Zyrtec daily  Use albuterol nebulizer or inhaler as needed  Follow up with Dr. Halford Chessman  or Yates Weisgerber NP in 1 year and As needed   Please contact office for sooner follow up if symptoms do not improve or worsen or seek emergency care   '     Melessa Cowell, NP 10/04/2021

## 2021-10-04 NOTE — Progress Notes (Signed)
Reviewed and agree with assessment/plan.   Coralyn Helling, MD [facility] 10/04/2021, 10:20 AM Pager:  [phone]

## 2021-10-04 NOTE — Assessment & Plan Note (Signed)
Continue on current regimen.  Plan  Patient Instructions  Mucinex DM Twice daily  As needed  Cough/congestion  Saline nasal spray and gel. As needed   Continue on Pulmicort and Perforomist twice daily, rinse after use  Continue on Singulair and Zyrtec daily  Use albuterol nebulizer or inhaler as needed  Follow up with Dr. Craige Cotta  or Amantha Sklar NP in 1 year and As needed   Please contact office for sooner follow up if symptoms do not improve or worsen or seek emergency care   '

## 2021-10-06 DIAGNOSIS — J0100 Acute maxillary sinusitis, unspecified: Secondary | ICD-10-CM | POA: Diagnosis not present

## 2021-10-08 ENCOUNTER — Ambulatory Visit: Payer: Medicare Other | Admitting: Physical Therapy

## 2021-10-09 ENCOUNTER — Ambulatory Visit (INDEPENDENT_AMBULATORY_CARE_PROVIDER_SITE_OTHER): Payer: Medicare Other | Admitting: Family

## 2021-10-09 ENCOUNTER — Ambulatory Visit (INDEPENDENT_AMBULATORY_CARE_PROVIDER_SITE_OTHER): Payer: Medicare Other | Admitting: Allergy and Immunology

## 2021-10-09 ENCOUNTER — Encounter (INDEPENDENT_AMBULATORY_CARE_PROVIDER_SITE_OTHER): Payer: Self-pay | Admitting: Family

## 2021-10-09 VITALS — BP 112/62 | HR 107 | Temp 97.6°F | Ht <= 58 in | Wt 178.0 lb

## 2021-10-09 VITALS — BP 112/80 | HR 76 | Ht <= 58 in | Wt 178.1 lb

## 2021-10-09 DIAGNOSIS — F840 Autistic disorder: Secondary | ICD-10-CM

## 2021-10-09 DIAGNOSIS — J4540 Moderate persistent asthma, uncomplicated: Secondary | ICD-10-CM | POA: Insufficient documentation

## 2021-10-09 DIAGNOSIS — G4700 Insomnia, unspecified: Secondary | ICD-10-CM | POA: Diagnosis not present

## 2021-10-09 DIAGNOSIS — F819 Developmental disorder of scholastic skills, unspecified: Secondary | ICD-10-CM

## 2021-10-09 DIAGNOSIS — G40209 Localization-related (focal) (partial) symptomatic epilepsy and epileptic syndromes with complex partial seizures, not intractable, without status epilepticus: Secondary | ICD-10-CM | POA: Diagnosis not present

## 2021-10-09 DIAGNOSIS — K219 Gastro-esophageal reflux disease without esophagitis: Secondary | ICD-10-CM | POA: Diagnosis not present

## 2021-10-09 DIAGNOSIS — E559 Vitamin D deficiency, unspecified: Secondary | ICD-10-CM | POA: Insufficient documentation

## 2021-10-09 DIAGNOSIS — J310 Chronic rhinitis: Secondary | ICD-10-CM

## 2021-10-09 DIAGNOSIS — R269 Unspecified abnormalities of gait and mobility: Secondary | ICD-10-CM

## 2021-10-09 DIAGNOSIS — G40309 Generalized idiopathic epilepsy and epileptic syndromes, not intractable, without status epilepticus: Secondary | ICD-10-CM

## 2021-10-09 DIAGNOSIS — N390 Urinary tract infection, site not specified: Secondary | ICD-10-CM

## 2021-10-09 DIAGNOSIS — G478 Other sleep disorders: Secondary | ICD-10-CM | POA: Diagnosis not present

## 2021-10-09 DIAGNOSIS — R296 Repeated falls: Secondary | ICD-10-CM

## 2021-10-09 MED ORDER — CETIRIZINE HCL 10 MG PO TABS: 10.0000 mg | ORAL_TABLET | Freq: Two times a day (BID) | ORAL | 5 refills | Status: DC | PRN

## 2021-10-09 MED ORDER — BUDESONIDE 1 MG/2ML IN SUSP: RESPIRATORY_TRACT | 0 refills | Status: DC

## 2021-10-09 MED ORDER — ALBUTEROL SULFATE HFA 108 (90 BASE) MCG/ACT IN AERS: 2.0000 | INHALATION_SPRAY | RESPIRATORY_TRACT | 0 refills | Status: DC | PRN

## 2021-10-09 MED ORDER — ALBUTEROL SULFATE (2.5 MG/3ML) 0.083% IN NEBU: 2.5000 mg | INHALATION_SOLUTION | RESPIRATORY_TRACT | 0 refills | Status: DC | PRN

## 2021-10-09 MED ORDER — IPRATROPIUM-ALBUTEROL 0.5-2.5 (3) MG/3ML IN SOLN: 3.0000 mL | RESPIRATORY_TRACT | 2 refills | Status: DC | PRN

## 2021-10-09 MED ORDER — MONTELUKAST SODIUM 10 MG PO TABS: 10.0000 mg | ORAL_TABLET | Freq: Every evening | ORAL | 5 refills | Status: DC

## 2021-10-09 MED ORDER — FORMOTEROL FUMARATE 20 MCG/2ML IN NEBU: 20.0000 ug | INHALATION_SOLUTION | Freq: Two times a day (BID) | RESPIRATORY_TRACT | 5 refills | Status: DC

## 2021-10-09 NOTE — Patient Instructions (Addendum)
  1. Continue a combination of the following:   A. budesonide 1 mg nebulized twice a day  B. Perforomist nebulized twice a day  C. Nasal saline / gel 2 times per day  D. montelukast 10 mg daily  E. cetirizine 10 mg daily  2. Continue to Treat reflux with a combination of the following:   A. Famotidine 40mg  twice a day  B. No caffeine or chocolate consumption   3. "Action plan" for asthma flare up:   A. increase budesonide to 4 times a day  B. Albuterol or DUONEB nebulization every 4-6 hours if needed  4. If needed:   A. Cetirizine 10 mg - 1 tablet 1 time per day  5. Need to follow up with urologist or find a different urologist to direct care concerning chronic UTI. Why is she having recurrent UTI?  6. Return to clinic 12 weeks or earlier if problem. Do not miss appointment.  7. Plan for fall flu vaccine

## 2021-10-09 NOTE — Progress Notes (Unsigned)
[name]   MRN:  [id number]  03-10-1996   Provider: Rockwell Germany NP-C Location of Care: [facility]  Visit type: Return visit  Last visit: 12/26/2020  Referral source: Maude Leriche, PA-C History from: Epic chart, patient's mother  Brief history:  Copied from previous record: Generalized and convulsive epilepsy, intellectual disability, autism spectrum disorder, disordered sleep and gait disorder. She also has asthma and history of frequent urinary tract infections. She is taking and tolerating Depakote for seizures and Trazodone for insomnia.  Today's concerns: Mom reports today that Lori Miles has remained seizure free since her last visit. She has had recurrent respiratory and urinary tract infections. She is going to physical therapy for her problems with gait and tendency to fall. Lori Miles has behaviors consistent with autism and can be fairly self-directed.   Mom is having her own health issues and is overwhelmed with Lori Miles's care at times.   [name] has been otherwise generally healthy since she was last seen. Mom has no other health concerns for her today other than previously mentioned.  Review of systems: Please see HPI for neurologic and other pertinent review of systems. Otherwise all other systems were reviewed and were negative.  Problem List: Patient Active Problem List   Diagnosis Date Noted   Encounter for long-term (current) use of high-risk medication 12/29/2020   Menorrhagia 12/16/2020   Renal cyst 11/29/2019   Intertrigo 11/29/2019   Complex care coordination 05/18/2019   Screening for cervical cancer 02/26/2019   Glucosuria 06/01/2018   Steroid-induced diabetes ([place]) 06/01/2018   Not well controlled moderate persistent asthma 04/29/2018   Gastroesophageal reflux disease 04/29/2018   Recurrent infections 04/29/2018   Dysuria 02/20/2018   Chronic rhinitis 04/03/2017   Recurrent falls 03/21/2017   Weakness 03/21/2017    Venous insufficiency 03/21/2017   OSA on CPAP 09/30/2016   Excessive daytime sleepiness 03/05/2016   Insomnia 10/04/2015   Bronchitis 05/05/2015   Abnormality of gait 03/22/2015   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   Cough variant asthma vs UACS/ vcd  03/15/2015   Pneumonia 02/14/2015   Autism spectrum disorder 02/14/2015   Nausea with vomiting 02/14/2015   Obesity, morbid ([place]) 12/13/2014   Mental retardation, 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 if anesthesia did take antibiotic   Frequency-urgency syndrome    Gait disorder    ORGANIC PER NERUOLGIST NOTE (DR HICKLING)   Generalized convulsive epilepsy ([place]) NEUROLOGIST-  DR HICKLING   GERD (gastroesophageal reflux disease)    Gout    H/O sinus tachycardia    History of acute respiratory failure 02/14/2015   SECONDARY TO CAP  History of recurrent UTIs    Incontinent of feces    Interstitial cystitis    Intertrigo 11/29/2019   Moderate intellectual disability    OSA (obstructive sleep apnea)    no machine yet    Partial epilepsy with impairment of consciousness ([place])    FOLLOWED BY DR HICKLING   Pneumonia 2    4 times  this year ( 2020)   PONV (postoperative nausea and vomiting)    Renal cyst 11/29/2019   Seizure ([place])    most recent sz " at the end of summer"-last sz years ago per mother   Urinary incontinence     Past medical history comments: See HPI Copied from previous record: EEG on September 14, 2011, was normal. She was able to be successfully weaned off Keppra. The patient has significant issues with aggression, which was thought to be related to [place], but has persisted once it was discontinued. The patient was treated with Depakote both for behavior and possible seizures. She had palpitations and severe constipation on Seroquel. She had marked weight gain on Abilify and Depakote. When generic Divalproex was tried she had breakthrough seizures.      Nocturnal polysomnogram on October 27, 2011, failed to show significant periodic limb movements, cyanosis, sleep apnea, or cardiac arrhythmia.  EEG on February 20, 2012, was a normal study with the patient awake.    EEG performed March 25, 2017 was normal   EEG performed May 21, 2018 was normal  Surgical history: Past Surgical History:  Procedure Laterality Date   CYSTOSCOPY N/A 06/03/2017   Procedure: CYSTOSCOPY WITH EXAM UNDER ANESTHESIA;  Surgeon: Festus Aloe, MD;  Location: [facility];  Service: Urology;  Laterality: N/A;   CYSTOSCOPY  2020   CYSTOSCOPY WITH HYDRODISTENSION AND BIOPSY  04/14/2012   Procedure: CYSTOSCOPY/BIOPSY/HYDRODISTENSION;  Surgeon: Hanley Ben, MD;  Location: [place];  Service: Urology;;  instillation of marcaine and pyridium   EUA/  PAP SMEAR/  BREAST EXAM  03-12-2016   dr Ihor Dow [facility]   EUA/ VAGINOSCOPY/ COLPOSCOPY FO THE HYMENAL RING  10-04-2009    dr Delsa Sale  [place] & MGMT  07/29/2019   IR RADIOLOGIST EVAL & MGMT  09/30/2019   IR SCLEROTHERAPY OF A FLUID COLLECTION  09/08/2019   RADIOLOGY WITH ANESTHESIA N/A 09/08/2019   Procedure: IR Sclerotherapy  Treatment;  Surgeon: Radiologist, Medication, MD;  Location: [place];  Service: Radiology;  Laterality: N/A;   TONSILLECTOMY       Family history: family history includes Pancreatic cancer in her sister; Seizures in her mother and sister.   Social history: Social History   Socioeconomic History   Marital status: Single    Spouse name: Not on file   Number of children: Not on file   Years of education: Not on file   Highest education level: Not on file  Occupational History   Not on file  Tobacco Use   Smoking status: Never   Smokeless tobacco: Never  Vaping Use   Vaping Use: Never used  Substance and Sexual Activity   Alcohol use: No    Alcohol/week: 0.0 standard drinks of alcohol   Drug use: No   Sexual activity: Never    Birth control/protection: Pill  Other Topics Concern   Not on file  Social History Narrative   Lives with mom ([name] Amborn) and half-sister Alexarae Oliva) who is also mentally impaired.  Has CAP assistance, urinary incontinence, needs help with feeding,dressing, toileting  Graduated from [employer].  She enjoys playing with cars, dancing, and dogs.   Social Determinants of Health   Financial Resource Strain: Not on file  Food Insecurity: Not on file  Transportation Needs: Not on file  Physical Activity: Not on file  Stress: Not on file  Social Connections: Not on file  Intimate Partner Violence: Not on file    Past/failed meds: Copied from previous record: Levetiracetam - aggressive behavior   Allergies: Allergies  Allergen Reactions   Abilify [Aripiprazole] Swelling and Palpitations   Seroquel [Quetiapine Fumarate] Palpitations   Amoxicillin-Pot Clavulanate Hives and Other (See Comments)    Has patient had a PCN reaction causing immediate rash, facial/tongue/throat swelling, SOB or lightheadedness with hypotension: No Has patient had a PCN reaction causing severe rash involving mucus membranes or skin necrosis:    #  #  YES  #   #  Has patient had a PCN reaction that required hospitalization: No Has patient had a PCN reaction occurring within the last 10 years: No    Bactrim [Sulfamethoxazole-Trimethoprim] Other (See Comments)    Abdominal pain   Doxycycline Other (See Comments)    Stomach cramps   Macrobid [Nitrofurantoin Macrocrystal] Other (See Comments)    Sharp pain/ given with Tylenol 325 mg   Keppra [Levetiracetam] Other (See Comments)    Aggressive behavior     Immunizations: Immunization History  Administered Date(s) Administered   DTaP 02/06/1995, 04/30/1995, 07/02/1995, 01/14/1996, 10/10/1998   Hepatitis B, ped/adol 31[birth year]1994, 02/06/1995, 01/14/1996   HiB (PRP-T) 02/06/1995, 04/30/1995, 07/02/1995, 01/14/1996   IPV 02/06/1995, 04/30/1995, 07/02/1995, 01/14/1996, 10/10/1998   MMR 01/14/1996, 10/10/1998   Pneumococcal Polysaccharide-23 08/06/2016, 03/07/2020   Tdap 03/03/2017   Varicella 01/14/1996    Diagnostics/Screenings: Copied from previous record: 12/15/19 rEEG - This EEG is normal during awake and asleep states. Please note that normal EEG does not exclude epilepsy, clinical correlation is indicated.  Teressa Lower, MD   03/27/17 - CT head - normal  Physical Exam: There were no vitals taken for this visit.  General: well developed, well nourished girl, seated in exam room, in no evident distress Head: normocephalic and atraumatic. Oropharynx benign. No dysmorphic features. Neck: supple Cardiovascular: regular rate and rhythm, no murmurs. Respiratory: clear to auscultation bilaterally Abdomen: bowel sounds present all four quadrants, abdomen soft, non-tender, non-distended.  Musculoskeletal: no skeletal deformities or obvious scoliosis. Skin: no rashes or neurocutaneous lesions  Neurologic Exam Mental Status: awake and fully alert. Limited language. Variable eye contact. Smiles responsively. Resistant to putting down her ipad for examination Cranial Nerves: fundoscopic exam - red  reflex present.  Unable to fully visualize fundus.  Pupils equal briskly reactive to light.  Turns to localize faces and objects in the periphery. Turns to localize sounds in the periphery. Facial movements are symmetric  Motor: normal functional bulk, tone and strength Sensory: withdrawal x 4 Coordination: unable to adequately assess due to patient's inability to participate in examination. No dysmetria when reaching for objects. Gait and Station: able to stand with assistance. Able to take steps but has poor balance and needs support.  Reflexes: diminished and symmetric. Toes neutral. No clonus   Impression: Generalized convulsive epilepsy ([place])  Cognitive developmental delay  Obesity, morbid ([place])  Autism spectrum disorder  Abnormality of gait  Recurrent falls  Partial epilepsy with impairment of consciousness ([facility])  Insomnia, unspecified type  Sleep arousal disorder   Recommendations for plan of care: The patient's previous Epic records were reviewed. Brynli has neither had nor  required imaging or lab studies since the last visit, other than what has been performed by other providers. She has remained seizure free since her last visit. I instructed Mom to continue her medications without change and to let me know if Tanieka has any seizures. I will see her back in follow up in 6 months or sooner if needed. Mom agreed with the plans made today.  The medication list was reviewed and reconciled. No changes were made in the prescribed medications today. A complete medication list was provided to the patient.  Return in about 6 months (around 04/11/2022).   Allergies as of 10/09/2021       Reactions   Abilify [aripiprazole] Swelling, Palpitations   Seroquel [quetiapine Fumarate] Palpitations   Amoxicillin-pot Clavulanate Hives, Other (See Comments)   Has patient had a PCN reaction causing immediate rash, facial/tongue/throat swelling, SOB or lightheadedness with hypotension:  No Has patient had a PCN reaction causing severe rash involving mucus membranes or skin necrosis:    #  #  YES  #  #  Has patient had a PCN reaction that required hospitalization: No Has patient had a PCN reaction occurring within the last 10 years: No   Bactrim [sulfamethoxazole-trimethoprim] Other (See Comments)   Abdominal pain   Doxycycline Other (See Comments)   Stomach cramps   Macrobid [nitrofurantoin Macrocrystal] Other (See Comments)   Sharp pain/ given with Tylenol 325 mg   Keppra [levetiracetam] Other (See Comments)   Aggressive behavior         Medication List        Accurate as of October 09, 2021  7:56 AM. If you have any questions, ask your nurse or doctor.          acetaminophen 325 MG tablet Commonly known as: TYLENOL Take 2 tablets (650 mg total) by mouth every 6 (six) hours as needed for mild pain or moderate pain.   albuterol (2.5 MG/3ML) 0.083% nebulizer solution Commonly known as: PROVENTIL Take 3 mLs (2.5 mg total) by nebulization every 4 (four) hours as needed for wheezing or shortness of breath.   albuterol 108 (90 Base) MCG/ACT inhaler Commonly known as: VENTOLIN HFA INHALE 2 PUFFS INTO THE LUNGS EVERY 6 HOURS AS NEEDED FOR WHEEZING OR SHORTNESS OF BREATH   benzonatate 100 MG capsule Commonly known as: TESSALON Take 1 capsule (100 mg total) by mouth every 8 (eight) hours.   budesonide 1 MG/2ML nebulizer solution Commonly known as: PULMICORT USE 1 VIAL VIA NEBULIZER FOUR TIMES DAILY DURING RESPITORY INFECTIONS AND ASTHMA FLARES FOR 1-2 WEEKS   cephALEXin 500 MG capsule Commonly known as: KEFLEX Take 500 mg by mouth 3 (three) times daily.   cetirizine 10 MG tablet Commonly known as: ZYRTEC Take 1 tablet (10 mg total) by mouth 2 (two) times daily as needed for allergies (Can use an extra dose during flare ups).   Depakote 500 MG DR tablet Generic drug: divalproex TAKE 1 TABLET BY MOUTH IN THE MORNING AND 2 TABLETS AT NIGHT   famotidine 20  MG tablet Commonly known as: PEPCID TAKE 1 TABLET(20 MG) BY MOUTH EVERY 12 HOURS   FeroSul 325 (65 FE) MG tablet Generic drug: ferrous sulfate Take 325 mg by mouth daily.   fluconazole 150 MG tablet Commonly known as: DIFLUCAN Take 1 tablet (150 mg total) by mouth as needed (when on antibiotics).   formoterol 20 MCG/2ML nebulizer solution Commonly known as: Perforomist USE 1 VIAL VIA NEBULIZER TWICE DAILY   hydrOXYzine 25 MG  tablet Commonly known as: ATARAX Take 25 mg by mouth 3 (three) times daily as needed (pain).   ipratropium-albuterol 0.5-2.5 (3) MG/3ML Soln Commonly known as: DUONEB Take 3 mLs by nebulization every 4 (four) hours as needed.   melatonin 3 MG Tabs tablet Take 2 tablets (44m) by mouth at bedtime.   montelukast 10 MG tablet Commonly known as: SINGULAIR TAKE 1 TABLET(10 MG) BY MOUTH AT BEDTIME   Nebulizer Misc Adult mask with tubing. Use as directed with nebulizer device.   nystatin powder Commonly known as: MYCOSTATIN/NYSTOP Apply 1 application topically 3 (three) times daily.   Olopatadine HCl 0.2 % Soln Can use one drop in each eye once daily if needed for red, itching, watery eyes.   ondansetron 4 MG disintegrating tablet Commonly known as: Zofran ODT Take 2 tablets (8 mg total) by mouth every 8 (eight) hours as needed for nausea or vomiting.   polyethylene glycol powder 17 GM/SCOOP powder Commonly known as: GLYCOLAX/MIRALAX DISSOLVE 1 CAPFUL(17 GRAMS) IN AT LEAST 8 OUNCES OF WATER/JUICE TWICE DAILY   Seasonique 0.15-0.03 &0.01 MG tablet Generic drug: Levonorgestrel-Ethinyl Estradiol Take 1 tablet by mouth daily.   senna 8.6 MG tablet Commonly known as: SENOKOT Take 1 tablet by mouth at bedtime.   terconazole 0.4 % vaginal cream Commonly known as: TERAZOL 7 Place 1 applicator vaginally at bedtime.   terconazole 0.8 % vaginal cream Commonly known as: TERAZOL 3 Place 1 applicator vaginally at bedtime. Apply nightly for three nights.    traZODone 50 MG tablet Commonly known as: DESYREL TAKE 2 TABLETS(100 MG) BY MOUTH AT BEDTIME   Vitamin D3 1.25 MG (50000 UT) Caps Take 1 capsule by mouth once a week.      Total time spent with the patient was 20 minutes, of which 50% or more was spent in counseling and coordination of care.  TRockwell GermanyNP-C [facility] Ph. [phone] [phone]

## 2021-10-09 NOTE — Patient Instructions (Signed)
It was a pleasure to see you today!  Instructions for you until your next appointment are as follows: Continue Talaysha's medications as prescribed Let me know if she has seizures Please sign up for MyChart if you have not done so. Please plan to return for follow up in 6 months or sooner if needed.  Feel free to contact our office during normal business hours at [phone] with questions or concerns. If there is no answer or the call is outside business hours, please leave a message and our clinic staff will call you back within the next business day.  If you have an urgent concern, please stay on the line for our after-hours answering service and ask for the on-call neurologist.     I also encourage you to use MyChart to communicate with me more directly. If you have not yet signed up for MyChart within [facility], the front desk staff can help you. However, please note that this inbox is NOT monitored on nights or weekends, and response can take up to 2 business days.  Urgent matters should be discussed with the on-call pediatric neurologist.   At [facility], we are committed to providing exceptional care. You will receive a patient satisfaction survey through text or email regarding your visit today. Your opinion is important to me. Comments are appreciated.

## 2021-10-09 NOTE — Progress Notes (Unsigned)
[place] [facility] [place] - [place] - [place] - [place]   Follow-up Note  Referring Provider: No ref. provider found Primary Provider: Scifres, Peter Minium (Inactive) Date of Office Visit: 10/09/2021  Subjective:   Lori Miles (DOB: 1995/04/12) is a 26 y.o. female who [facility] in re-evaluation of the following:  HPI: Lori Miles returns to this clinic in reevaluation of asthma, allergic rhinitis, reflux.  I last saw her in this clinic on 23 January 2021.  Her mom is with her today as she is nonverbal  She has a very significant problem with treatment for recurrent infections predominately involving her urinary tract but occasionally with the development of "sinus" and "bronchitis".  Her "sinus" is manifested as stuffiness, bumps on her skin, ears turning red, occasional puffy eyes, and more seizure activity.  Her "bronchitis" is manifested as wheezing, and some coughing.  Apparently she will develop urinary tract infections and then developed sinus and bronchitis.  She has a very big problem with recurrent urinary tract infections and has required the ministration of multiple antibiotics to get this under control mostly administered by the urgent care center but occasionally by her urologist.  She has had culture positive identification of both Klebsiella and Proteus.  Currently she is on amoxicillin for an episode of Proteus UTI.  She continues to use budesonide and Perforomist twice a day and occasionally will go up to 4 times per day if she gets a little bit more wheezing.  This actually does appear to help her somewhat.  She has not had any problems with reflux although she does have recurrent sore throats.  She has been using famotidine.  Allergies as of 10/09/2021       Reactions   Abilify [aripiprazole] Swelling, Palpitations   Seroquel [quetiapine Fumarate] Palpitations   Amoxicillin-pot Clavulanate Hives, Other (See  Comments)   Has patient had a PCN reaction causing immediate rash, facial/tongue/throat swelling, SOB or lightheadedness with hypotension: No Has patient had a PCN reaction causing severe rash involving mucus membranes or skin necrosis:    #  #  YES  #  #  Has patient had a PCN reaction that required hospitalization: No Has patient had a PCN reaction occurring within the last 10 years: No   Bactrim [sulfamethoxazole-trimethoprim] Other (See Comments)   Abdominal pain   Doxycycline Other (See Comments)   Stomach cramps   Macrobid [nitrofurantoin Macrocrystal] Other (See Comments)   Sharp pain/ given with Tylenol 325 mg   Keppra [levetiracetam] Other (See Comments)   Aggressive behavior         Medication List    acetaminophen 325 MG tablet Commonly known as: TYLENOL Take 2 tablets (650 mg total) by mouth every 6 (six) hours as needed for mild pain or moderate pain.   albuterol (2.5 MG/3ML) 0.083% nebulizer solution Commonly known as: PROVENTIL Take 3 mLs (2.5 mg total) by nebulization every 4 (four) hours as needed for wheezing or shortness of breath.   albuterol 108 (90 Base) MCG/ACT inhaler Commonly known as: VENTOLIN HFA INHALE 2 PUFFS INTO THE LUNGS EVERY 6 HOURS AS NEEDED FOR WHEEZING OR SHORTNESS OF BREATH   budesonide 1 MG/2ML nebulizer solution Commonly known as: PULMICORT USE 1 VIAL VIA NEBULIZER FOUR TIMES DAILY DURING RESPITORY INFECTIONS AND ASTHMA FLARES FOR 1-2 WEEKS   cetirizine 10 MG tablet Commonly known as: ZYRTEC Take 1 tablet (10 mg total) by mouth 2 (two) times daily as needed for allergies (  Can use an extra dose during flare ups).   Depakote 500 MG DR tablet Generic drug: divalproex TAKE 1 TABLET BY MOUTH IN THE MORNING AND 2 TABLETS AT NIGHT   famotidine 20 MG tablet Commonly known as: PEPCID TAKE 1 TABLET(20 MG) BY MOUTH EVERY 12 HOURS   FeroSul 325 (65 FE) MG tablet Generic drug: ferrous sulfate Take 325 mg by mouth daily.   fluconazole 150  MG tablet Commonly known as: DIFLUCAN Take 1 tablet (150 mg total) by mouth as needed (when on antibiotics).   formoterol 20 MCG/2ML nebulizer solution Commonly known as: Perforomist USE 1 VIAL VIA NEBULIZER TWICE DAILY   hydrOXYzine 25 MG tablet Commonly known as: ATARAX Take 25 mg by mouth 3 (three) times daily as needed (pain).   ipratropium-albuterol 0.5-2.5 (3) MG/3ML Soln Commonly known as: DUONEB Take 3 mLs by nebulization every 4 (four) hours as needed.   melatonin 3 MG Tabs tablet Take 2 tablets (6mg ) by mouth at bedtime.   montelukast 10 MG tablet Commonly known as: SINGULAIR TAKE 1 TABLET(10 MG) BY MOUTH AT BEDTIME   Nebulizer Misc Adult mask with tubing. Use as directed with nebulizer device.   nystatin powder Commonly known as: MYCOSTATIN/NYSTOP Apply 1 application topically 3 (three) times daily.   Olopatadine HCl 0.2 % Soln Can use one drop in each eye once daily if needed for red, itching, watery eyes.   ondansetron 4 MG disintegrating tablet Commonly known as: Zofran ODT Take 2 tablets (8 mg total) by mouth every 8 (eight) hours as needed for nausea or vomiting.   polyethylene glycol powder 17 GM/SCOOP powder Commonly known as: GLYCOLAX/MIRALAX DISSOLVE 1 CAPFUL(17 GRAMS) IN AT LEAST 8 OUNCES OF WATER/JUICE TWICE DAILY   Seasonique 0.15-0.03 &0.01 MG tablet Generic drug: Levonorgestrel-Ethinyl Estradiol Take 1 tablet by mouth daily.   senna 8.6 MG tablet Commonly known as: SENOKOT Take 1 tablet by mouth at bedtime.   terconazole 0.8 % vaginal cream Commonly known as: TERAZOL 3 Place 1 applicator vaginally at bedtime. Apply nightly for three nights.   traZODone 50 MG tablet Commonly known as: DESYREL TAKE 2 TABLETS(100 MG) BY MOUTH AT BEDTIME   Vitamin D3 1.25 MG (50000 UT) Caps Take 1 capsule by mouth once a week.    Past Medical History:  Diagnosis Date   Asthma    Autism    mom states no actual dx, but admits she has some symptoms    Bacteriuria    Chronic constipation    Cognitive developmental delay    Constipation    COVID-19 03/2020   Disruptive behavior disorder    Dyspnea    if she walks much   Family history of adverse reaction to anesthesia     "hives"-? if anesthesia did take antibiotic   Frequency-urgency syndrome    Gait disorder    ORGANIC PER NERUOLGIST NOTE (DR HICKLING)   Generalized convulsive epilepsy (HCC) NEUROLOGIST-  DR HICKLING   GERD (gastroesophageal reflux disease)    Gout    H/O sinus tachycardia    History of acute respiratory failure 02/14/2015   SECONDARY TO CAP   History of recurrent UTIs    Incontinent of feces    Interstitial cystitis    Intertrigo 11/29/2019   Moderate intellectual disability    OSA (obstructive sleep apnea)    no machine yet    Partial epilepsy with impairment of consciousness (HCC)    FOLLOWED BY DR HICKLING   Pneumonia 2    4 times  this year ( 2020)   PONV (postoperative nausea and vomiting)    Renal cyst 11/29/2019   Seizure (HCC)    most recent sz " at the end of summer"-last sz years ago per mother   Urinary incontinence     Past Surgical History:  Procedure Laterality Date   CYSTOSCOPY N/A 06/03/2017   Procedure: CYSTOSCOPY WITH EXAM UNDER ANESTHESIA;  Surgeon: Jerilee Field, MD;  Location: [facility];  Service: Urology;  Laterality: N/A;   CYSTOSCOPY  2020   CYSTOSCOPY WITH HYDRODISTENSION AND BIOPSY  04/14/2012   Procedure: CYSTOSCOPY/BIOPSY/HYDRODISTENSION;  Surgeon: Lindaann Slough, MD;  Location: [facility];  Service: Urology;;  instillation of marcaine and pyridium   EUA/  PAP SMEAR/  BREAST EXAM  03-12-2016   dr Erin Fulling [facility]   EUA/ VAGINOSCOPY/ COLPOSCOPY FO THE HYMENAL RING  10-04-2009    dr Tamela Oddi  [facility]   IR RADIOLOGIST EVAL & MGMT  07/29/2019   IR RADIOLOGIST EVAL & MGMT  09/30/2019   IR SCLEROTHERAPY OF A FLUID COLLECTION  09/08/2019   RADIOLOGY WITH ANESTHESIA N/A 09/08/2019   Procedure: IR  Sclerotherapy Treatment;  Surgeon: Radiologist, Medication, MD;  Location: [facility];  Service: Radiology;  Laterality: N/A;   TONSILLECTOMY      Review of systems negative except as noted in HPI / PMHx or noted below:  Review of Systems  Constitutional: Negative.   HENT: Negative.    Eyes: Negative.   Respiratory: Negative.    Cardiovascular: Negative.   Gastrointestinal: Negative.   Genitourinary: Negative.   Musculoskeletal: Negative.   Skin: Negative.   Neurological: Negative.   Endo/Heme/Allergies: Negative.   Psychiatric/Behavioral: Negative.       Objective:   Vitals:   10/09/21 1501  BP: 112/62  Pulse: (!) 107  Temp: 97.6 F (36.4 C)  SpO2: 97%   Height: 4' 9.5" (146.1 cm)  Weight: 178 lb (80.7 kg)   Physical Exam Constitutional:      Appearance: She is not diaphoretic.  HENT:     Head: Normocephalic.     Right Ear: Tympanic membrane, ear canal and external ear normal.     Left Ear: Tympanic membrane, ear canal and external ear normal.     Nose: Nose normal. No mucosal edema or rhinorrhea.     Mouth/Throat:     Pharynx: Uvula midline. No oropharyngeal exudate.  Eyes:     Conjunctiva/sclera: Conjunctivae normal.  Neck:     Thyroid: No thyromegaly.     Trachea: Trachea normal. No tracheal tenderness or tracheal deviation.  Cardiovascular:     Rate and Rhythm: Normal rate and regular rhythm.     Heart sounds: Normal heart sounds, S1 normal and S2 normal. No murmur heard. Pulmonary:     Effort: No respiratory distress.     Breath sounds: Normal breath sounds. No stridor. No wheezing or rales.  Lymphadenopathy:     Head:     Right side of head: No tonsillar adenopathy.     Left side of head: No tonsillar adenopathy.     Cervical: No cervical adenopathy.  Skin:    Findings: No erythema or rash.     Nails: There is no clubbing.  Neurological:     Mental Status: She is alert.     Diagnostics:      Assessment and Plan:   No diagnosis  found.  Patient Instructions   1. Continue a combination of the following:   A. budesonide 1 mg nebulized twice  a day  B. Perforomist nebulized twice a day  C. Nasal saline / gel 2 times per day  D. montelukast 10 mg daily  E. cetirizine 10 mg daily  2. Continue to Treat reflux with a combination of the following:   A. Famotidine 40mg  twice a day  B. No caffeine or chocolate consumption   3. "Action plan" for asthma flare up:   A. increase budesonide to 4 times a day  B. Albuterol or DUONEB nebulization every 4-6 hours if needed  4. If needed:   A. Cetirizine 10 mg - 1 tablet 1 time per day  5. Need to follow up with urologist or find a different urologist to direct care concerning chronic UTI. Why is she having recurrent UTI?  5. Return to clinic 12 weeks or earlier if problem  7. Plan for fall flu vaccine  , MD Allergy / Immunology [facility]

## 2021-10-10 ENCOUNTER — Telehealth: Payer: Self-pay | Admitting: Allergy and Immunology

## 2021-10-10 ENCOUNTER — Telehealth: Payer: Self-pay | Admitting: Internal Medicine

## 2021-10-10 ENCOUNTER — Encounter: Payer: Self-pay | Admitting: Allergy and Immunology

## 2021-10-10 NOTE — Telephone Encounter (Signed)
Patient mom called about the meds that was sent in. Needs to talk with the nurse // [phone]

## 2021-10-10 NOTE — Telephone Encounter (Signed)
Spoke to mom and she stated that she did not want the office to send any of the refills into the pharmacy. She spoke to the pharmacy and let them know that she did not want the refills at this time and that she would contact them when she needed refills and they would contact the office. She also states that patient does not use the inhaler but uses the nebulizer only.

## 2021-10-11 ENCOUNTER — Encounter (INDEPENDENT_AMBULATORY_CARE_PROVIDER_SITE_OTHER): Payer: Self-pay | Admitting: Family

## 2021-10-15 NOTE — Telephone Encounter (Signed)
If blood is mixed with the stool we would likely recommend a colonoscopy This would be done obviously after she is sedated Let's bring her to the office an appt and for this conversation with the knowledge that if rectal exam is too stressful it can be deferred to the time of colonoscopy versus flexible sigmoidoscopy

## 2021-10-15 NOTE — Telephone Encounter (Signed)
Patient's mother called again stating that patient is having issues with her reflux and bleeding.  Please call and advise.  Thank you.

## 2021-10-15 NOTE — Telephone Encounter (Signed)
I will try to determine if she still constipated?  If she is using senna and MiraLAX is this resulting in normal bowel movement? Is there any pain associated with the rectal bleeding? Is the rectal bleeding associated with straining or hard or large stool? The answers to these questions will dictate my recommendation, certainly would be reasonable to have her seen in the office though she must be made aware of the rectal exam of the advised

## 2021-10-15 NOTE — Telephone Encounter (Signed)
Pts mother calling, states pt is still having BRB per rectum. She reports she is taking her 2 Senna tabs and 2 doses of miralax. Pt is still getting UTI's every month. Mother wants to know if she needs to do anything different. Please advise.

## 2021-10-15 NOTE — Telephone Encounter (Signed)
Pts mother reports that sometimes the stools are hard and sometimes they are soft. She has a bowel movement every day and states she has no straining. She is not sitting on the toilet for a long time like she used to and she does not have any pain with bowel movements. Mother thinks the blood just comes out with the stool. Mentioned that for an OV she would have to have a rectal exam and she states something would have to be given to her to "calm her down" prior to the exam.

## 2021-10-16 ENCOUNTER — Ambulatory Visit: Payer: Medicare Other | Admitting: Physical Therapy

## 2021-10-16 NOTE — Telephone Encounter (Signed)
Pts mother aware of Dr. Lauro Franklin recommendations. Pt scheduled to see Dr. Rhea Belton 12/25/21 at 8:30am. Mother aware of appt.

## 2021-10-18 NOTE — Telephone Encounter (Signed)
noted 

## 2021-10-19 ENCOUNTER — Other Ambulatory Visit: Payer: Self-pay

## 2021-10-19 ENCOUNTER — Telehealth: Payer: Self-pay

## 2021-10-19 MED ORDER — TERCONAZOLE 0.8 % VA CREA: 1.0000 | TOPICAL_CREAM | Freq: Every day | VAGINAL | 0 refills | Status: DC

## 2021-10-19 NOTE — Telephone Encounter (Signed)
S/w mother (authorized on [place]) and advised that rx for terconazole was sent

## 2021-10-29 ENCOUNTER — Telehealth: Payer: Self-pay

## 2021-10-29 ENCOUNTER — Other Ambulatory Visit: Payer: Self-pay | Admitting: Allergy and Immunology

## 2021-10-29 NOTE — Telephone Encounter (Signed)
Patient's mother, Lori Miles, called in - DOB/Pharmacy verified - stated normally Budesonide (Pulmicort) 1 mg/2 mL nebulizer solution quantity is 240 mL not 60 mL due number of times patient uses her nebulizer when she's in an asthma flare. Mother requested additional quantity be sent in to pharmacy.  Approved 240 ml with RF: 1 in Surescripts for patient.

## 2021-10-31 DIAGNOSIS — J4520 Mild intermittent asthma, uncomplicated: Secondary | ICD-10-CM | POA: Diagnosis not present

## 2021-11-05 DIAGNOSIS — R300 Dysuria: Secondary | ICD-10-CM | POA: Diagnosis not present

## 2021-11-05 DIAGNOSIS — N390 Urinary tract infection, site not specified: Secondary | ICD-10-CM | POA: Diagnosis not present

## 2021-11-06 ENCOUNTER — Ambulatory Visit: Payer: Medicare Other | Attending: Family | Admitting: Physical Therapy

## 2021-11-06 ENCOUNTER — Encounter: Payer: Self-pay | Admitting: Obstetrics and Gynecology

## 2021-11-06 ENCOUNTER — Ambulatory Visit (INDEPENDENT_AMBULATORY_CARE_PROVIDER_SITE_OTHER): Payer: Medicare Other | Admitting: Obstetrics and Gynecology

## 2021-11-06 VITALS — Ht <= 58 in | Wt 170.0 lb

## 2021-11-06 DIAGNOSIS — Z01419 Encounter for gynecological examination (general) (routine) without abnormal findings: Secondary | ICD-10-CM

## 2021-11-06 DIAGNOSIS — R2681 Unsteadiness on feet: Secondary | ICD-10-CM | POA: Diagnosis not present

## 2021-11-06 DIAGNOSIS — M6281 Muscle weakness (generalized): Secondary | ICD-10-CM | POA: Insufficient documentation

## 2021-11-06 DIAGNOSIS — R2689 Other abnormalities of gait and mobility: Secondary | ICD-10-CM | POA: Insufficient documentation

## 2021-11-06 DIAGNOSIS — R32 Unspecified urinary incontinence: Secondary | ICD-10-CM | POA: Diagnosis not present

## 2021-11-06 DIAGNOSIS — Z760 Encounter for issue of repeat prescription: Secondary | ICD-10-CM | POA: Diagnosis not present

## 2021-11-06 MED ORDER — SEASONIQUE 0.15-0.03 &0.01 MG PO TABS: 1.0000 | ORAL_TABLET | Freq: Every day | ORAL | 5 refills | Status: AC

## 2021-11-06 MED ORDER — TERCONAZOLE 0.8 % VA CREA: 1.0000 | TOPICAL_CREAM | Freq: Every day | VAGINAL | 3 refills | Status: DC

## 2021-11-06 MED ORDER — NYSTATIN 100000 UNIT/GM EX POWD: 1.0000 | Freq: Three times a day (TID) | CUTANEOUS | 0 refills | Status: DC

## 2021-11-06 MED ORDER — FLUCONAZOLE 150 MG PO TABS: 150.0000 mg | ORAL_TABLET | ORAL | 3 refills | Status: DC | PRN

## 2021-11-06 NOTE — Progress Notes (Signed)
Pt presents for AEX. Per pt mother, pt has recurring yeast infection due to incontinence and wearing pull ups.   Pt requesting Rx refill for terconazole.

## 2021-11-06 NOTE — Progress Notes (Signed)
Subjective:     Lori Miles is a 26 y.o. female P0 with BMI 36 who is here for a comprehensive physical exam. The patient is accompanied by her mother who is the legal representative. Patient has autism. Patient denies any complaints. Mother also reports no concerns other than on going management for recurrent UTI. She also reports some urinary incontinence for which she wears a pull up and as a result has recurrent candida infections. Her menses are well controlled on Seasonique. Mother is requesting refill on birth control and anti-fungal treatments. She also desires a referral to urogynecology top evaluate incontinence  Past Medical History:  Diagnosis Date   Asthma    Autism    mom states no actual dx, but admits she has some symptoms   Bacteriuria    Chronic constipation    Cognitive developmental delay    Constipation    COVID-19 03/2020   Disruptive behavior disorder    Dyspnea    if she walks much   Family history of adverse reaction to anesthesia     "hives"-? if anesthesia did take antibiotic   Frequency-urgency syndrome    Gait disorder    ORGANIC PER NERUOLGIST NOTE (DR HICKLING)   Generalized convulsive epilepsy (HCC) NEUROLOGIST-  DR HICKLING   GERD (gastroesophageal reflux disease)    Gout    H/O sinus tachycardia    History of acute respiratory failure 02/14/2015   SECONDARY TO CAP   History of recurrent UTIs    Incontinent of feces    Interstitial cystitis    Intertrigo 11/29/2019   Moderate intellectual disability    OSA (obstructive sleep apnea)    no machine yet    Partial epilepsy with impairment of consciousness (HCC)    FOLLOWED BY DR HICKLING   Pneumonia 2    4 times this year ( 2020)   PONV (postoperative nausea and vomiting)    Renal cyst 11/29/2019   Seizure (HCC)    most recent sz " at the end of summer"-last sz years ago per mother   Urinary incontinence    .  Social History   Socioeconomic History   Marital status: Single    Spouse  name: Not on file   Number of children: Not on file   Years of education: Not on file   Highest education level: Not on file  Occupational History   Not on file  Tobacco Use   Smoking status: Never   Smokeless tobacco: Never  Vaping Use   Vaping Use: Never used  Substance and Sexual Activity   Alcohol use: No    Alcohol/week: 0.0 standard drinks of alcohol   Drug use: No   Sexual activity: Never    Birth control/protection: Pill  Other Topics Concern   Not on file  Social History Narrative   Lives with mom (Lori Miles) and half-sister Lori Miles) who is also mentally impaired.  Has CAP assistance, urinary incontinence, needs help with feeding,dressing, toileting   Graduated from [employer].  She enjoys playing with cars, dancing, and dogs.   Social Determinants of Health   Financial Resource Strain: Not on file  Food Insecurity: Not on file  Transportation Needs: Not on file  Physical Activity: Not on file  Stress: Not on file  Social Connections: Not on file  Intimate Partner Violence: Not on file   Health Maintenance  Topic Date Due   COVID-19 Vaccine (1) Never done   URINE MICROALBUMIN  Never done  HIV Screening  Never done   Hepatitis C Screening  Never done   INFLUENZA VACCINE  10/16/2021   PAP-Cervical Cytology Screening  03/02/2022   PAP SMEAR-Modifier  03/02/2022   TETANUS/TDAP  03/04/2027   HPV VACCINES  Aged Out       Review of Systems Pertinent items noted in HPI and remainder of comprehensive ROS otherwise negative.   Objective:    GENERAL: Well-developed, well-nourished female in no acute distress.  NEURO: alert and oriented x 3 Physical exam deferred per mother     Assessment:    Healthy female exam.      Plan:    Pap smear will be performed under anesthesia Refill on medications provided Patient referred to urogynecology  See After Visit Summary for Counseling Recommendations

## 2021-11-07 ENCOUNTER — Encounter: Payer: Self-pay | Admitting: Physical Therapy

## 2021-11-07 NOTE — Therapy (Signed)
OUTPATIENT PHYSICAL THERAPY NEURO TREATMENT NOTE   Patient Name: Lori Miles MRN: [id number] DOB:1996-03-02, 26 y.o., female Today's Date: 11/07/2021   PCP: Clementeen GrahamScifres, [name], PA-C REFERRING PROVIDER: Elveria RisingGoodpasture, Tina, NP    PT End of Session - 11/07/21 1141     Visit Number 2    Number of Visits 7    Date for PT Re-Evaluation 11/23/21    Authorization Type [employer] Medicare    Authorization Time Period 10-01-21 - 12-15-21    PT Start Time 0801    PT Stop Time 0845    PT Time Calculation (min) 44 min    Activity Tolerance Patient tolerated treatment well    [facility] for tasks assessed/performed             Past Medical History:  Diagnosis Date   Asthma    Autism    mom states no actual dx, but admits she has some symptoms   Bacteriuria    Chronic constipation    Cognitive developmental delay    Constipation    COVID-19 03/2020   Disruptive behavior disorder    Dyspnea    if she walks much   Family history of adverse reaction to anesthesia     "hives"-? if anesthesia did take antibiotic   Frequency-urgency syndrome    Gait disorder    ORGANIC PER NERUOLGIST NOTE (DR HICKLING)   Generalized convulsive epilepsy (HCC) NEUROLOGIST-  DR HICKLING   GERD (gastroesophageal reflux disease)    Gout    H/O sinus tachycardia    History of acute respiratory failure 02/14/2015   SECONDARY TO CAP   History of recurrent UTIs    Incontinent of feces    Interstitial cystitis    Intertrigo 11/29/2019   Moderate intellectual disability    OSA (obstructive sleep apnea)    no machine yet    Partial epilepsy with impairment of consciousness (HCC)    FOLLOWED BY DR HICKLING   Pneumonia 2    4 times this year ( 2020)   PONV (postoperative nausea and vomiting)    Renal cyst 11/29/2019   Seizure (HCC)    most recent sz " at the end of summer"-last sz years ago per mother   Urinary incontinence    Past Surgical History:  Procedure Laterality Date    CYSTOSCOPY N/A 06/03/2017   Procedure: CYSTOSCOPY WITH EXAM UNDER ANESTHESIA;  Surgeon: Jerilee FieldEskridge, [name], MD;  [facility];  Service: Urology;  Laterality: N/A;   CYSTOSCOPY  2020   CYSTOSCOPY WITH HYDRODISTENSION AND BIOPSY  04/14/2012   Procedure: CYSTOSCOPY/BIOPSY/HYDRODISTENSION;  Surgeon: Lindaann SloughMarc-Henry Nesi, MD;  Location:  SUR[facility]ERY CENTER;  Service: Urology;;  instillation of marcaine and pyridium   EUA/  PAP SMEAR/  BREAST EXAM  03-12-2016   [facility]   EUA/ VAGINOSCOPY/ COLPOSCOPY FO THE HYMENAL RING  10-04-2009    [facility]   IR RADIOLOGIST EVAL & MGMT  07/29/2019   IR RADIOLOGIST EVAL & MGMT  09/30/2019   IR SCLEROTHERAPY OF A FLUID COLLECTION  09/08/2019   RADIOLOGY WITH ANESTHESIA N/A 09/08/2019   Procedure: IR Sclerotherapy Treatment;  Surgeon: Radiologist, Medication, MD;  Location: [facility];  Service: Radiology;  Laterality: N/A;   TONSILLECTOMY     Patient Active Problem List   Diagnosis Date Noted   Moderate persistent asthma, uncomplicated 10/09/2021   Vitamin D deficiency 10/09/2021   Encounter for long-term (current) use of high-risk medication 12/29/2020   Menorrhagia 12/16/2020  UTI symptoms 04/10/2020   Renal cyst 11/29/2019   Intertrigo 11/29/2019   Complex care coordination 05/18/2019   Chronic bladder pain 03/03/2019   Screening for cervical cancer 02/26/2019   Glucosuria 06/01/2018   Steroid-induced diabetes ([place]) 06/01/2018   Not well controlled moderate persistent asthma 04/29/2018   Gastroesophageal reflux disease 04/29/2018   Recurrent infections 04/29/2018   Dysuria 02/20/2018   Chronic rhinitis 04/03/2017   Recurrent falls 03/21/2017   Weakness 03/21/2017   Venous insufficiency 03/21/2017   OSA on CPAP 09/30/2016   Excessive daytime sleepiness 03/05/2016   Insomnia 10/04/2015   Acute recurrent pansinusitis 07/11/2015   Bronchitis 05/05/2015   Abnormality of gait 03/22/2015   Disruptive behavior  disorder 03/22/2015   Cough variant asthma vs UACS/ vcd  03/15/2015   Pneumonia 02/14/2015   Autism spectrum disorder 02/14/2015   Nausea with vomiting 02/14/2015   Obesity, morbid ([place]) 12/13/2014   Mental retardation, 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   Chronic constipation 08/01/2011   Contraception 08/01/2011   Encopresis with constipation and overflow incontinence 02/15/2011   Functional urinary incontinence 02/02/2010   Other convulsions 09/05/2009    ONSET DATE: Referral date 09-05-21  REFERRING DIAG: Diagnosis G40.309 (ICD-10-CM) - Generalized convulsive epilepsy ([place]) F81.9 (ICD-10-CM) - Cognitive developmental delay E66.01 (ICD-10-CM) - Obesity, morbid ([place]) F84.0 (ICD-10-CM) - Autism spectrum disorder R26.9 (ICD-10-CM) - Abnormality of gait R29.6 (ICD-10-CM) - Recurrent falls   THERAPY DIAG:  Other abnormalities of gait and mobility  Muscle weakness (generalized)  Unsteadiness on feet  Rationale for Evaluation and Treatment Rehabilitation  SUBJECTIVE:                                                                                                                                                                                              SUBJECTIVE STATEMENT: [name] reports pt had UTI last week and was prescribed antibiotics; no other changes reported since initial eval on 10-01-21;  this is pt's first treatment session since eval due to scheduling issues with pt's mother needing specific days and times and due to primary PT's limited schedule availability  Pt accompanied by:  mother,  Lori Miles  PERTINENT HISTORY: Autism spectrum disorder, h/o generalized convulsive epilepsy, steroid induced diabetes, h/o seizures, moderate intellectual disability  PAIN:  Are you  having pain? No - pt does not respond when asks if she has pain; mother states she has swelling in both legs  PRECAUTIONS: None  WEIGHT BEARING RESTRICTIONS No  FALLS: Has patient fallen in last 6 months? No  LIVING ENVIRONMENT: Lives with: lives with their family Lives in: House/apartment Stairs: No Has following equipment at home: Environmental consultant - 4 wheeled, Wheelchair (manual), and Tour manager  PLOF: Independent with household mobility with device, Needs assistance with ADLs, and Needs assistance with homemaking  Needs assistance with community ambulation (supervision needed due to decreased cognition)  PATIENT GOALS increase activity to increase strength; improve balance and endurance  OBJECTIVE:  TREATMENT 11-06-21:     STAIRS:  Level of Assistance:  SBA  Stair Negotiation Technique: Step to Pattern Alternating Pattern  with Bilateral Rails  Number of Stairs: 4   Height of Stairs: 6  Comments: pt ascended steps using step over step sequence and descended steps using step by step  GAIT: Gait pattern: step through pattern; LLE internally rotated with increased supination noted in Lt foot in stance Distance walked: 230'  x 2 reps Assistive device utilized: None Level of assistance: SBA Comments: LLE continues to internally rotate  THEREX:  Pt performed 5 times sit to stand from high low mat table with feet on Airex without UE support  Leg press 40# 3 sets 10 reps bil. LE's  SciFit level 2.5 x 5" with bil. UE's and LE's - cues to try to maintain at least 60 steps/min for cardiovascular conditioning Step ups onto 6" step 10 reps RLE and LLE with LUE support on hand rail   NEURORE-ED:  Rockerboard anterior/posteriorly inside // bars 10 reps with UE support on // bars Rocking forward -  alternating stepping down to floor with each foot 5 times each LE  Cone taps to 4 cones with min HHA to improve SLS on each leg Walked in figure 8 pattern around cones on floor to improve balance with turning - SBA  Pt jumped over the 4 cones 2 reps - CGA for safety  Pt stood on Airex inside // bars without UE support - performed head turns horizontally 5 reps with CGA for safety due to mild postural instability  PATIENT EDUCATION: Education details: eval results with POC of 6 weeks Person educated: Parent Education method: Explanation Education comprehension: verbalized understanding   HOME EXERCISE PROGRAM: To be issued    GOALS: Goals reviewed with patient? Yes  SHORT TERM GOALS: Target date: 10-22-21 (3 weeks);  extended out due to lack of attendance - 11-12-21  Amb. 460' nonstop without device with SBA to demo increased endurance. Baseline: 230' with SBA Goal status:  Ongoing  2.  Improve TUG score to </= 13.5 secs without device to decrease fall risk.  Baseline: 15.91 secs Goal status: Ongoing  3.  Perform HEP for balance and strengthening with mother's assist.  Baseline:  Goal status: Ongoing   LONG TERM GOALS: Target date: 12-15-21 extended out due to first tx session on 11-06-21 - 6 weeks  Pt will amb. 600' without device on flat, even surface without LOB with SBA. Baseline: 230'  Goal status: INITIAL  2.  Perform at least 10" on recumbent bike nonstop for improved endurance/activity.  Baseline: 3 1/2" prior to resting with doing SciFit exercise Goal status: INITIAL  3.  Improve TUG score to </= 11.5 secs without device to decrease fall risk.  Baseline: 15.91 secs without device Baseline:  Goal status: INITIAL  4.  Pt will perform sit to stand 5 times without use of UE support from mat table. Baseline: 1 rep without UE support at initial eval Goal status: INITIAL  5.  Provide updated HEP as appropriate to be performed with mother's assist. Baseline:   Goal status: INITIAL  ASSESSMENT:  CLINICAL IMPRESSION: PT session focused on bil. LE strengthening, gait training for endurance training and balance training to facilitate improved SLS on each leg.  Pt needed UE support for safety with some of balance activities, however, pt able to safely jump over 4 cones (approx. 8" high) without LOB.  All STG's ongoing as pt has not attended any treatment sessions since initial eval on 10-01-21.  Pt will benefit from continued PT to address balance, strength, and gait deficits.  Cont with POC.     OBJECTIVE IMPAIRMENTS Abnormal gait, decreased activity tolerance, decreased balance, decreased cognition, decreased endurance, and decreased strength.   ACTIVITY LIMITATIONS carrying, lifting, bending, squatting, stairs, and locomotion level  PARTICIPATION LIMITATIONS: meal prep, cleaning, laundry, and shopping  PERSONAL FACTORS Behavior pattern, Fitness, Past/current experiences, Time since onset of injury/illness/exacerbation, and 1 comorbidity: Behavior patterns/cognition  are also affecting patient's functional outcome.   REHAB POTENTIAL: Fair   CLINICAL DECISION MAKING: Stable/uncomplicated  EVALUATION COMPLEXITY: Low  PLAN: PT FREQUENCY: 1x/week  PT DURATION: 6 weeks  PLANNED INTERVENTIONS: Therapeutic exercises, Therapeutic activity, Neuromuscular re-education, Balance training, Gait training, Patient/Family education, Self Care, and Stair training  PLAN FOR NEXT SESSION: LE strengthening and balance training   Keiyana Stehr, Donavan Burnet, PT 11/07/2021, 11:44 AM

## 2021-11-11 DIAGNOSIS — R509 Fever, unspecified: Secondary | ICD-10-CM | POA: Diagnosis not present

## 2021-11-11 DIAGNOSIS — R062 Wheezing: Secondary | ICD-10-CM | POA: Diagnosis not present

## 2021-11-11 DIAGNOSIS — H66002 Acute suppurative otitis media without spontaneous rupture of ear drum, left ear: Secondary | ICD-10-CM | POA: Diagnosis not present

## 2021-11-11 DIAGNOSIS — U071 COVID-19: Secondary | ICD-10-CM | POA: Diagnosis not present

## 2021-11-11 DIAGNOSIS — R6883 Chills (without fever): Secondary | ICD-10-CM | POA: Diagnosis not present

## 2021-11-11 DIAGNOSIS — R059 Cough, unspecified: Secondary | ICD-10-CM | POA: Diagnosis not present

## 2021-11-11 DIAGNOSIS — Z8709 Personal history of other diseases of the respiratory system: Secondary | ICD-10-CM | POA: Diagnosis not present

## 2021-11-13 ENCOUNTER — Ambulatory Visit: Payer: Medicare Other | Admitting: Allergy and Immunology

## 2021-11-13 ENCOUNTER — Ambulatory Visit: Payer: Medicare Other | Admitting: Physical Therapy

## 2021-11-14 ENCOUNTER — Encounter (HOSPITAL_COMMUNITY): Payer: Self-pay | Admitting: Emergency Medicine

## 2021-11-14 ENCOUNTER — Other Ambulatory Visit: Payer: Self-pay

## 2021-11-14 ENCOUNTER — Emergency Department (HOSPITAL_COMMUNITY)
Admission: EM | Admit: 2021-11-14 | Discharge: 2021-11-14 | Disposition: A | Discharge status: Home / Self Care | Payer: Medicare Other | Attending: Emergency Medicine | Admitting: Emergency Medicine

## 2021-11-14 DIAGNOSIS — D72829 Elevated white blood cell count, unspecified: Secondary | ICD-10-CM | POA: Insufficient documentation

## 2021-11-14 DIAGNOSIS — H6690 Otitis media, unspecified, unspecified ear: Secondary | ICD-10-CM | POA: Diagnosis not present

## 2021-11-14 DIAGNOSIS — R1114 Bilious vomiting: Secondary | ICD-10-CM | POA: Diagnosis not present

## 2021-11-14 DIAGNOSIS — N390 Urinary tract infection, site not specified: Secondary | ICD-10-CM | POA: Insufficient documentation

## 2021-11-14 DIAGNOSIS — R112 Nausea with vomiting, unspecified: Secondary | ICD-10-CM | POA: Diagnosis not present

## 2021-11-14 DIAGNOSIS — F19230 Other psychoactive substance dependence with withdrawal, uncomplicated: Secondary | ICD-10-CM | POA: Diagnosis not present

## 2021-11-14 LAB — CBC WITH DIFFERENTIAL/PLATELET
Abs Immature Granulocytes: 0 10*3/uL (ref 0.00–0.07)
Basophils Absolute: 0 10*3/uL (ref 0.0–0.1)
Basophils Relative: 0 %
Eosinophils Absolute: 0 10*3/uL (ref 0.0–0.5)
Eosinophils Relative: 0 %
HCT: 38.2 % (ref 36.0–46.0)
Hemoglobin: 12.6 g/dL (ref 12.0–15.0)
Lymphocytes Relative: 55 %
Lymphs Abs: 9.8 10*3/uL — ABNORMAL HIGH (ref 0.7–4.0)
MCH: 31 pg (ref 26.0–34.0)
MCHC: 33 g/dL (ref 30.0–36.0)
MCV: 94.1 fL (ref 80.0–100.0)
Monocytes Absolute: 1.1 10*3/uL — ABNORMAL HIGH (ref 0.1–1.0)
Monocytes Relative: 6 %
Neutro Abs: 6.9 10*3/uL (ref 1.7–7.7)
Neutrophils Relative %: 39 %
Platelets: 282 10*3/uL (ref 150–400)
RBC: 4.06 MIL/uL (ref 3.87–5.11)
RDW: 13.5 % (ref 11.5–15.5)
WBC: 17.8 10*3/uL — ABNORMAL HIGH (ref 4.0–10.5)
nRBC: 0 % (ref 0.0–0.2)
nRBC: 0 /100 WBC

## 2021-11-14 LAB — COMPREHENSIVE METABOLIC PANEL
ALT: 22 U/L (ref 0–44)
AST: 15 U/L (ref 15–41)
Albumin: 2.7 g/dL — ABNORMAL LOW (ref 3.5–5.0)
Alkaline Phosphatase: 38 U/L (ref 38–126)
Anion gap: 8 (ref 5–15)
BUN: 16 mg/dL (ref 6–20)
CO2: 24 mmol/L (ref 22–32)
Calcium: 7.9 mg/dL — ABNORMAL LOW (ref 8.9–10.3)
Chloride: 106 mmol/L (ref 98–111)
Creatinine, Ser: 1.06 mg/dL — ABNORMAL HIGH (ref 0.44–1.00)
GFR, Estimated: >60 mL/min (ref >60)
Glucose, Bld: 109 mg/dL — ABNORMAL HIGH (ref 70–99)
Potassium: 3.4 mmol/L — ABNORMAL LOW (ref 3.5–5.1)
Sodium: 138 mmol/L (ref 135–145)
Total Bilirubin: 0.2 mg/dL — ABNORMAL LOW (ref 0.3–1.2)
Total Protein: 5.2 g/dL — ABNORMAL LOW (ref 6.5–8.1)

## 2021-11-14 LAB — URINALYSIS, ROUTINE W REFLEX MICROSCOPIC
Bilirubin Urine: NEGATIVE
Glucose, UA: NEGATIVE mg/dL
Hgb urine dipstick: NEGATIVE
Ketones, ur: NEGATIVE mg/dL
Leukocytes,Ua: NEGATIVE
Nitrite: NEGATIVE
Protein, ur: NEGATIVE mg/dL
Specific Gravity, Urine: 1.012 (ref 1.005–1.030)
pH: 6 (ref 5.0–8.0)

## 2021-11-14 MED ORDER — SODIUM CHLORIDE 0.9 % IV BOLUS
500.0000 mL | Freq: Once | INTRAVENOUS | Status: AC
  Administered 2021-11-14: 500 mL via INTRAVENOUS

## 2021-11-14 MED ORDER — PREDNISONE 20 MG PO TABS: 40.0000 mg | ORAL_TABLET | Freq: Every day | ORAL | 0 refills | Status: DC

## 2021-11-14 NOTE — ED Provider Notes (Signed)
[facility] EMERGENCY DEPARTMENT Provider Note   CSN: [id number] Arrival date & time: 11/14/21  1646     History  Chief Complaint  Patient presents with   Urinary Tract Infection   Otitis Media    Lori Miles is a 26 y.o. female.  HPI  Cognitive dysfunction, multiple medical problems including obesity, recurrent urinary tract infection, seizures now presents with her mother with concern for ongoing nausea, vomiting, diarrhea in the context of recent positive COVID test and abnormal urinalysis.  Notably those results were followed with urine culture, and today they were notified of abnormal culture results with suggestion that the patient's urinalysis may be refractory to the amoxicillin she is currently taking for antibiotics.  She was referred here by her primary care physician due to positive urine culture, and the after mentioned symptoms.    Home Medications Prior to Admission medications   Medication Sig Start Date End Date Taking? Authorizing Provider  predniSONE (DELTASONE) 20 MG tablet Take 2 tablets (40 mg total) by mouth daily with breakfast. For the next four days 11/14/21  Yes Carmin Muskrat, MD  acetaminophen (TYLENOL) 325 MG tablet Take 2 tablets (650 mg total) by mouth every 6 (six) hours as needed for mild pain or moderate pain. 02/27/17   Waynetta Pean, PA-C  albuterol (PROVENTIL) (2.5 MG/3ML) 0.083% nebulizer solution Take 3 mLs (2.5 mg total) by nebulization every 4 (four) hours as needed for wheezing or shortness of breath. 10/09/21   Kozlow, Donnamarie Poag, MD  albuterol (VENTOLIN HFA) 108 (90 Base) MCG/ACT inhaler Inhale 2 puffs into the lungs every 4 (four) hours as needed for wheezing or shortness of breath. 10/09/21   Kozlow, Donnamarie Poag, MD  amoxicillin-clavulanate (AUGMENTIN) 875-125 MG tablet Take 1 tablet by mouth 2 (two) times daily.    [provider]  cetirizine (ZYRTEC) 10 MG tablet Take 1 tablet (10 mg total) by mouth 2 (two) times  daily as needed for allergies (Can use an extra dose during flare ups). 10/09/21   Kozlow, Donnamarie Poag, MD  Cholecalciferol (VITAMIN D3) 1.25 MG (50000 UT) CAPS Take 1 capsule by mouth once a week. 09/21/20   [provider]  DEPAKOTE 500 MG DR tablet TAKE 1 TABLET BY MOUTH IN THE MORNING AND 2 TABLETS AT NIGHT 06/25/21   Rockwell Germany, NP  famotidine (PEPCID) 20 MG tablet TAKE 1 TABLET(20 MG) BY MOUTH EVERY 12 HOURS 01/23/21   Kozlow, Donnamarie Poag, MD  FEROSUL 325 (65 Fe) MG tablet Take 325 mg by mouth daily. 10/01/20   [provider]  fluconazole (DIFLUCAN) 150 MG tablet Take 1 tablet (150 mg total) by mouth as needed (when on antibiotics). 11/06/21   Constant, Peggy, MD  formoterol (PERFOROMIST) 20 MCG/2ML nebulizer solution Take 2 mLs (20 mcg total) by nebulization 2 (two) times daily. USE 1 VIAL VIA NEBULIZER TWICE DAILY 10/09/21   Kozlow, Donnamarie Poag, MD  hydrOXYzine (ATARAX/VISTARIL) 25 MG tablet Take 25 mg by mouth 3 (three) times daily as needed (pain).  01/13/19   [provider]  ipratropium-albuterol (DUONEB) 0.5-2.5 (3) MG/3ML SOLN Take 3 mLs by nebulization every 4 (four) hours as needed. 10/09/21   Kozlow, Donnamarie Poag, MD  melatonin 3 MG TABS tablet Take 2 tablets (6mg ) by mouth at bedtime. 01/28/20   Rockwell Germany, NP  montelukast (SINGULAIR) 10 MG tablet Take 1 tablet (10 mg total) by mouth at bedtime. TAKE 1 TABLET(10 MG) BY MOUTH AT BEDTIME 10/09/21   Kozlow, Donnamarie Poag,  MD  Nebulizer MISC Adult mask with tubing. Use as directed with nebulizer device. 11/18/18   Kozlow, Alvira Philips, MD  nystatin (MYCOSTATIN/NYSTOP) powder Apply 1 application topically 3 (three) times daily. 11/07/20   Constant, Peggy, MD  nystatin powder Apply 1 Application topically 3 (three) times daily. 11/06/21   Constant, Peggy, MD  Olopatadine HCl 0.2 % SOLN Can use one drop in each eye once daily if needed for red, itching, watery eyes. 01/23/21   Kozlow, Alvira Philips, MD  ondansetron (ZOFRAN ODT) 4 MG disintegrating tablet  Take 2 tablets (8 mg total) by mouth every 8 (eight) hours as needed for nausea or vomiting. 09/10/20   Henderly, Britni A, PA-C  polyethylene glycol powder (GLYCOLAX/MIRALAX) 17 GM/SCOOP powder DISSOLVE 1 CAPFUL(17 GRAMS) IN AT LEAST 8 OUNCES OF WATER/JUICE TWICE DAILY 08/14/21   Pyrtle, Carie Caddy, MD  PULMICORT 1 MG/2ML nebulizer solution USE 1 VIAL IN NEBULIZER 4 TIMES DAILY DURING RESPITORY INFECTIONS AND ASTHMA FLARES 10/29/21   Kozlow, Alvira Philips, MD  SEASONIQUE 0.15-0.03 &0.01 MG tablet Take 1 tablet by mouth daily. 11/06/21   Constant, Peggy, MD  senna (SENOKOT) 8.6 MG tablet Take 1 tablet by mouth at bedtime.     [provider]  terconazole (TERAZOL 3) 0.8 % vaginal cream Place 1 applicator vaginally at bedtime. Apply nightly for three nights. 11/06/21   Constant, Peggy, MD  traZODone (DESYREL) 50 MG tablet TAKE 2 TABLETS(100 MG) BY MOUTH AT BEDTIME 06/25/21   Elveria Rising, NP      Allergies    Abilify [aripiprazole], Seroquel [quetiapine fumarate], Amoxicillin-pot clavulanate, Bactrim [sulfamethoxazole-trimethoprim], Doxycycline, Macrobid [nitrofurantoin macrocrystal], and Keppra [levetiracetam]    Review of Systems   Review of Systems  Unable to perform ROS: Other (mental retardation)    Physical Exam Updated Vital Signs BP (!) 109/59   Pulse 76   Temp 98.1 F (36.7 C) (Oral)   Resp 17   SpO2 100%  Physical Exam Vitals and nursing note reviewed.  Constitutional:      General: She is not in acute distress.    Appearance: She is well-developed. She is obese.  HENT:     Head: Normocephalic and atraumatic.  Eyes:     Conjunctiva/sclera: Conjunctivae normal.  Cardiovascular:     Rate and Rhythm: Normal rate and regular rhythm.  Pulmonary:     Effort: Pulmonary effort is normal. No respiratory distress.     Breath sounds: Normal breath sounds. No stridor.  Abdominal:     General: There is no distension.     Tenderness: There is no abdominal tenderness. There is no  guarding.  Skin:    General: Skin is warm and dry.  Neurological:     Mental Status: She is alert and oriented to person, place, and time.     Cranial Nerves: No cranial nerve deficit.  Psychiatric:        Behavior: Behavior is withdrawn.        Cognition and Memory: Cognition is impaired. Memory is impaired.     Comments: retardat     ED Results / Procedures / Treatments   Labs (all labs ordered are listed, but only abnormal results are displayed) Labs Reviewed  CBC WITH DIFFERENTIAL/PLATELET - Abnormal; Notable for the following components:      Result Value   WBC 17.8 (*)    Lymphs Abs 9.8 (*)    Monocytes Absolute 1.1 (*)    All other components within normal limits  COMPREHENSIVE METABOLIC PANEL - Abnormal;  Notable for the following components:   Potassium 3.4 (*)    Glucose, Bld 109 (*)    Creatinine, Ser 1.06 (*)    Calcium 7.9 (*)    Total Protein 5.2 (*)    Albumin 2.7 (*)    Total Bilirubin 0.2 (*)    All other components within normal limits  URINE CULTURE  URINALYSIS, ROUTINE W REFLEX MICROSCOPIC    EKG None  Radiology No results found.  Procedures Procedures    Medications Ordered in ED Medications  sodium chloride 0.9 % bolus 500 mL (0 mLs Intravenous Stopped 11/14/21 2110)    ED Course/ Medical Decision Making/ A&P This patient with a Hx of mental retardation, recurrent urinary tract infection, recent COVID diagnosis presents to the ED for concern of episodic nausea vomiting questionable ongoing urinary tract infection in the context of taking amoxicillin, this involves an extensive number of treatment options, and is a complaint that carries with it a high risk of complications and morbidity.    The differential diagnosis includes UTI, bacteremia, sepsis, dehydration   Social Determinants of Health:  Cognitive impairment, obesity  Additional history obtained:  Additional history and/or information obtained from mother, chart review,  notable for details of HPI included above   After the initial evaluation, orders, including: Urinalysis, labs, monitoring were initiated.   Patient placed on Cardiac and Pulse-Oximetry Monitors. The patient was maintained on a cardiac monitor.  The cardiac monitored showed an rhythm of 75 sinus normal The patient was also maintained on pulse oximetry. The readings were typically 100% room air normal   On repeat evaluation of the patient improved    Lab Tests:  I personally interpreted labs.  The pertinent results include: Urinalysis unremarkable, culture sent.  Patient does have leukocytosis, but absolute neutrophil count is 0, and she is on steroids, suggesting medication related effect given the absence of fever   Dispostion / Final MDM:  After consideration of the diagnostic results and the patient's response to treatment, patient will be discharged home to follow-up with primary care.  Hospitalization was a consideration given patient's mental retardation, recent urinary tract infection.  Ever, here the patient is afebrile, no hypotension, no tachycardia, no evidence for bacteremia, sepsis.  She does have leukocytosis as above, but with a normal absolute immature granulocyte count, no substantial evidence for bacteremia, sepsis suspicion for steroid related leukocytosis.  Mother with preference to go home, follow-up with primary care and this is reasonable.  With her recent COVID diagnosis, urinary tract infection she will continue her amoxicillin, follow-up with primary care.  Mother requests a several more days of steroids, as these typically do improve the patient's congestion, this was facilitated.  Patient will also continue using her albuterol regularly.  Final Clinical Impression(s) / ED Diagnoses Final diagnoses:  Bilious vomiting with nausea  Leukocytosis, unspecified type    Rx / DC Orders ED Discharge Orders          Ordered    predniSONE (DELTASONE) 20 MG tablet   Daily with breakfast        11/14/21 2314              Gerhard Munch, MD 11/14/21 2315

## 2021-11-14 NOTE — ED Triage Notes (Signed)
Pt brought here from home w/ mom for positive Urinary Culture that was taken on 8/27. Pt was sent for IV antibiotics due to recurrent UTIs and ear infection. Pt has been on amoxcillin for ear infection w/ no relief. Hx of c-diff due to antibiotic use. Pt also tested positive for COVID on 8/27.

## 2021-11-14 NOTE — ED Provider Triage Note (Signed)
Emergency Medicine Provider Triage Evaluation Note  Lori Miles , a 26 y.o. female  was evaluated in triage.  Pt with Hx of moderate mental retardation and autism.  Accompanied with mother.  Complains of multidrug-resistant UTI, recommended by urology to come to the ED for IV antibiotics.  Recent culture from 3 days ago demonstrated this.  Denies fevers, however with some nausea and vomiting.  Currently finishing another antibiotic for a recent ear infection.  Review of Systems  Positive:  Negative: See above  Physical Exam  BP (!) 109/59   Pulse 97   Temp 98.4 F (36.9 C) (Oral)   Resp 18   SpO2 100%  Gen:   Awake, no distress, smiling, interactive and playful Resp:  Normal effort  MSK:   Moves extremities without difficulty  Other:  Cognitive developmental delay at baseline.  Patient's mother confirms she is at baseline.  Medical Decision Making  Medically screening exam initiated at 5:15 PM.  Appropriate orders placed.  Lori Miles was informed that the remainder of the evaluation will be completed by another provider, this initial triage assessment does not replace that evaluation, and the importance of remaining in the ED until their evaluation is complete.     Cecil Cobbs, PA-C 11/14/21 1723

## 2021-11-14 NOTE — Discharge Instructions (Addendum)
As discussed, it is important that you monitor your daughter's condition carefully and do not hesitate to return here.  Otherwise, please call your family physician tomorrow to discuss appropriate ongoing outpatient follow-up.  Today's evaluation has been most notable for a normal urinalysis, but a urine culture has been sent.  Please use the albuterol nebulizer every 4 hours for the next 2 days, then as needed for additional symptom relief.

## 2021-11-15 LAB — URINE CULTURE: Culture: <10000 — AB

## 2021-11-20 ENCOUNTER — Ambulatory Visit: Payer: Medicare Other | Attending: Family | Admitting: Physical Therapy

## 2021-11-20 DIAGNOSIS — R2689 Other abnormalities of gait and mobility: Secondary | ICD-10-CM | POA: Diagnosis not present

## 2021-11-20 DIAGNOSIS — M6281 Muscle weakness (generalized): Secondary | ICD-10-CM | POA: Diagnosis not present

## 2021-11-20 DIAGNOSIS — R2681 Unsteadiness on feet: Secondary | ICD-10-CM | POA: Diagnosis not present

## 2021-11-20 NOTE — Therapy (Signed)
OUTPATIENT PHYSICAL THERAPY NEURO TREATMENT NOTE   Patient Name: Lori Miles MRN: [id number] DOB:1995/06/12, 26 y.o., female Today's Date: 11/21/2021   PCP: Clementeen Graham, PA-C REFERRING PROVIDER: Elveria Rising, NP    PT End of Session - 11/21/21 0830     Visit Number 3    Number of Visits 7    Date for PT Re-Evaluation 11/23/21    Authorization Type [employer] Medicare    Authorization Time Period 10-01-21 - 12-15-21    PT Start Time 0758    PT Stop Time 0844    PT Time Calculation (min) 46 min    Activity Tolerance Patient tolerated treatment well    Behavior During Therapy [facility] for tasks assessed/performed              Past Medical History:  Diagnosis Date   Asthma    Autism    mom states no actual dx, but admits she has some symptoms   Bacteriuria    Chronic constipation    Cognitive developmental delay    Constipation    COVID-19 03/2020   Disruptive behavior disorder    Dyspnea    if she walks much   Family history of adverse reaction to anesthesia     "hives"-? if anesthesia did take antibiotic   Frequency-urgency syndrome    Gait disorder    ORGANIC PER NERUOLGIST NOTE (DR HICKLING)   Generalized convulsive epilepsy (HCC) NEUROLOGIST-  DR HICKLING   GERD (gastroesophageal reflux disease)    Gout    H/O sinus tachycardia    History of acute respiratory failure 02/14/2015   SECONDARY TO CAP   History of recurrent UTIs    Incontinent of feces    Interstitial cystitis    Intertrigo 11/29/2019   Moderate intellectual disability    OSA (obstructive sleep apnea)    no machine yet    Partial epilepsy with impairment of consciousness (HCC)    FOLLOWED BY DR HICKLING   Pneumonia 2    4 times this year ( 2020)   PONV (postoperative nausea and vomiting)    Renal cyst 11/29/2019   Seizure (HCC)    most recent sz " at the end of summer"-last sz years ago per mother   Urinary incontinence    Past Surgical History:  Procedure Laterality Date    CYSTOSCOPY N/A 06/03/2017   Procedure: CYSTOSCOPY WITH EXAM UNDER ANESTHESIA;  Surgeon: Jerilee Field, MD;  Location: [facility];  Service: Urology;  Laterality: N/A;   CYSTOSCOPY  2020   CYSTOSCOPY WITH HYDRODISTENSION AND BIOPSY  04/14/2012   Procedure: CYSTOSCOPY/BIOPSY/HYDRODISTENSION;  Surgeon: Lindaann Slough, MD;  Location: [facility];  Service: Urology;;  instillation of marcaine and pyridium   EUA/  PAP SMEAR/  BREAST EXAM  03-12-2016   dr Erin Fulling [facility]   EUA/ VAGINOSCOPY/ COLPOSCOPY FO THE HYMENAL RING  10-04-2009    dr Tamela Oddi  [facility]   IR RADIOLOGIST EVAL & MGMT  07/29/2019   IR RADIOLOGIST EVAL & MGMT  09/30/2019   IR SCLEROTHERAPY OF A FLUID COLLECTION  09/08/2019   RADIOLOGY WITH ANESTHESIA N/A 09/08/2019   Procedure: IR Sclerotherapy Treatment;  Surgeon: Radiologist, Medication, MD;  Location: [facility];  Service: Radiology;  Laterality: N/A;   TONSILLECTOMY     Patient Active Problem List   Diagnosis Date Noted   Moderate persistent asthma, uncomplicated 10/09/2021   Vitamin D deficiency 10/09/2021   Encounter for long-term (current) use of high-risk medication 12/29/2020   Menorrhagia 12/16/2020  UTI symptoms 04/10/2020   Renal cyst 11/29/2019   Intertrigo 11/29/2019   Complex care coordination 05/18/2019   Chronic bladder pain 03/03/2019   Screening for cervical cancer 02/26/2019   Glucosuria 06/01/2018   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   Not well controlled moderate persistent asthma 04/29/2018   Gastroesophageal reflux disease 04/29/2018   Recurrent infections 04/29/2018   Dysuria 02/20/2018   Chronic rhinitis 04/03/2017   Recurrent falls 03/21/2017   Weakness 03/21/2017   Venous insufficiency 03/21/2017   OSA on CPAP 09/30/2016   Excessive daytime sleepiness 03/05/2016   Insomnia 10/04/2015   Acute recurrent pansinusitis 07/11/2015   Bronchitis 05/05/2015   Abnormality of gait 03/22/2015   Disruptive behavior  disorder 03/22/2015   Cough variant asthma vs UACS/ vcd  03/15/2015   Pneumonia 02/14/2015   Autism spectrum disorder 02/14/2015   Nausea with vomiting 02/14/2015   Obesity, morbid (HCC) 12/13/2014   Mental retardation, 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   Chronic constipation 08/01/2011   Contraception 08/01/2011   Encopresis with constipation and overflow incontinence 02/15/2011   Functional urinary incontinence 02/02/2010   Other convulsions 09/05/2009    ONSET DATE: Referral date 09-05-21  REFERRING DIAG: Diagnosis G40.309 (ICD-10-CM) - Generalized convulsive epilepsy (HCC) F81.9 (ICD-10-CM) - Cognitive developmental delay E66.01 (ICD-10-CM) - Obesity, morbid (HCC) F84.0 (ICD-10-CM) - Autism spectrum disorder R26.9 (ICD-10-CM) - Abnormality of gait R29.6 (ICD-10-CM) - Recurrent falls   THERAPY DIAG:  Other abnormalities of gait and mobility  Muscle weakness (generalized)  Unsteadiness on feet  Rationale for Evaluation and Treatment Rehabilitation  SUBJECTIVE:                                                                                                                                                                                              SUBJECTIVE STATEMENT: [name] reports pt is feeling better - had UTI last week but finished the antibiotics; pt wearing new [employer] shoes - Mother states she really likes them  Pt accompanied by:  mother, [name]  PERTINENT HISTORY: Autism spectrum disorder, h/o generalized convulsive epilepsy, steroid induced diabetes, h/o seizures, moderate intellectual  disability  PAIN:  Are you having pain? No - pt does not respond when asks if she has pain; mother states she has swelling in both legs  PRECAUTIONS: None  WEIGHT BEARING RESTRICTIONS No  FALLS: Has patient fallen in last 6 months? No  LIVING ENVIRONMENT: Lives with: lives with their family Lives in: House/apartment Stairs: No Has following equipment at home: Environmental consultant - 4 wheeled, Wheelchair (manual), and Electronics engineer  PLOF: Independent with household mobility with device, Needs assistance with ADLs, and Needs assistance with homemaking  Needs assistance with community ambulation (supervision needed due to decreased cognition)  PATIENT GOALS increase activity to increase strength; improve balance and endurance  OBJECTIVE:  TREATMENT 11-20-21:    STAIRS:  Level of Assistance:  SBA  Stair Negotiation Technique: Step to Pattern Alternating Pattern  with Bilateral Rails  Number of Stairs: 4   Height of Stairs: 6  Comments: pt ascended steps using step over step sequence and descended steps using step by step  GAIT: Gait pattern: step through pattern; LLE internally rotated with increased supination noted in Lt foot in stance Distance walked: 230'  x 2 reps = 460' total distance Assistive device utilized: None Level of assistance: SBA Comments: LLE continues to internally rotate  THEREX:  Pt performed 5 times sit to stand from high low mat table with feet on Airex without UE support from high low mat table  Leg press 40# 3 sets 10 reps bil. LE's  SciFit level 3.0 x 5" with bil. UE's and LE's - cues to try to maintain at least 65 steps/min for cardiovascular conditioning  Step ups onto 6" step 10 reps RLE and LLE with LUE support on hand rail   NEURORE-ED:  Rockerboard anterior/posteriorly inside // bars 10 reps with UE support on // bars  Cone taps to 4 cones with min HHA to improve SLS on each leg;  Pt jumped over the 4 cones 2 reps - SBA for safety; tap ups to each  cone with CGA 2 reps each foot to each cone - to improve SLS  Pt performed SLS balance activity - with 6 stepping stones of various sizes - min HHA for safety - pt stepped on each stone to improve SLS and feedforward balance reaction  PATIENT EDUCATION: Education details: eval results with POC of 6 weeks Person educated: Parent Education method: Explanation Education comprehension: verbalized understanding   HOME EXERCISE PROGRAM: To be issued    GOALS: Goals reviewed with patient? Yes  SHORT TERM GOALS: Target date: 10-22-21 (3 weeks);  extended out due to lack of attendance - 11-12-21  Amb. 460' nonstop without device with SBA to demo increased endurance. Baseline: 230' with SBA Goal status:  Ongoing  2.  Improve TUG score to </= 13.5 secs without device to decrease fall risk.  Baseline: 15.91 secs Goal status: Ongoing  3.  Perform HEP for balance and strengthening with mother's assist.  Baseline:  Goal status: Ongoing   LONG TERM GOALS: Target date: 12-15-21 extended out due to first tx session on 11-06-21 - 6 weeks  Pt will amb. 600' without device on flat, even surface without LOB with SBA. Baseline: 230'  Goal status: INITIAL  2.  Perform at least 10" on recumbent bike nonstop for improved endurance/activity.  Baseline: 3 1/2" prior to resting with doing SciFit exercise Goal status: INITIAL  3.  Improve TUG score to </= 11.5 secs without device to decrease fall risk.  Baseline: 15.91 secs without device Baseline:  Goal status: INITIAL  4.  Pt will perform sit to stand 5 times without use of UE support from mat table. Baseline: 1 rep without UE support  at initial eval Goal status: INITIAL  5.  Provide updated HEP as appropriate to be performed with mother's assist. Baseline:  Goal status: INITIAL  ASSESSMENT:  CLINICAL IMPRESSION: PT session focused on balance training to improve SLS on each leg, strengthening exercises for bil. LE's and gait training  without use of device.  Pt able to jump over cones with SBA without LOB.  Pt compliant with exercises & activities in today's session.  Pt is progressing well.  Cont with POC.     OBJECTIVE IMPAIRMENTS Abnormal gait, decreased activity tolerance, decreased balance, decreased cognition, decreased endurance, and decreased strength.   ACTIVITY LIMITATIONS carrying, lifting, bending, squatting, stairs, and locomotion level  PARTICIPATION LIMITATIONS: meal prep, cleaning, laundry, and shopping  PERSONAL FACTORS Behavior pattern, Fitness, Past/current experiences, Time since onset of injury/illness/exacerbation, and 1 comorbidity: Behavior patterns/cognition  are also affecting patient's functional outcome.   REHAB POTENTIAL: Fair   CLINICAL DECISION MAKING: Stable/uncomplicated  EVALUATION COMPLEXITY: Low  PLAN: PT FREQUENCY: 1x/week  PT DURATION: 6 weeks  PLANNED INTERVENTIONS: Therapeutic exercises, Therapeutic activity, Neuromuscular re-education, Balance training, Gait training, Patient/Family education, Self Care, and Stair training  PLAN FOR NEXT SESSION: LE strengthening and balance training   Marcelle Hepner, [place], PT 11/21/2021, 12:30 PM

## 2021-11-21 ENCOUNTER — Encounter: Payer: Self-pay | Admitting: Physical Therapy

## 2021-11-26 ENCOUNTER — Encounter: Payer: Self-pay | Admitting: Physical Therapy

## 2021-11-26 ENCOUNTER — Ambulatory Visit: Payer: Medicare Other | Admitting: Physical Therapy

## 2021-11-26 DIAGNOSIS — R2681 Unsteadiness on feet: Secondary | ICD-10-CM | POA: Diagnosis not present

## 2021-11-26 DIAGNOSIS — M6281 Muscle weakness (generalized): Secondary | ICD-10-CM

## 2021-11-26 DIAGNOSIS — R2689 Other abnormalities of gait and mobility: Secondary | ICD-10-CM | POA: Diagnosis not present

## 2021-11-26 NOTE — Therapy (Signed)
OUTPATIENT PHYSICAL THERAPY NEURO TREATMENT NOTE   Patient Name: Lori Miles MRN: [id number] DOB:1995-04-30, 26 y.o., female Today's Date: 11/26/2021   PCP: Lori Leriche, PA-C REFERRING PROVIDER: Rockwell Germany, NP    PT End of Session - 11/26/21 0851     Visit Number 4    Number of Visits 7    Date for PT Re-Evaluation 11/23/21    Authorization Type UHC Medicare    Authorization Time Period 10-01-21 - 12-15-21    PT Start Time 0759    PT Stop Time 0845    PT Time Calculation (min) 46 min    Activity Tolerance Patient tolerated treatment well    Behavior During Therapy [facility] for tasks assessed/performed              Past Medical History:  Diagnosis Date   Asthma    Autism    mom states no actual dx, but admits she has some symptoms   Bacteriuria    Chronic constipation    Cognitive developmental delay    Constipation    COVID-19 03/2020   Disruptive behavior disorder    Dyspnea    if she walks much   Family history of adverse reaction to anesthesia     "hives"-? if anesthesia did take antibiotic   Frequency-urgency syndrome    Gait disorder    ORGANIC PER NERUOLGIST NOTE (DR HICKLING)   Generalized convulsive epilepsy ([place]) NEUROLOGIST-  DR HICKLING   GERD (gastroesophageal reflux disease)    Gout    H/O sinus tachycardia    History of acute respiratory failure 02/14/2015   SECONDARY TO CAP   History of recurrent UTIs    Incontinent of feces    Interstitial cystitis    Intertrigo 11/29/2019   Moderate intellectual disability    OSA (obstructive sleep apnea)    no machine yet    Partial epilepsy with impairment of consciousness ([place])    FOLLOWED BY DR HICKLING   Pneumonia 2    4 times this year ( 2020)   PONV (postoperative nausea and vomiting)    Renal cyst 11/29/2019   Seizure ([place])    most recent sz " at the end of summer"-last sz years ago per mother   Urinary incontinence    Past Surgical History:  Procedure Laterality Date    CYSTOSCOPY N/A 06/03/2017   Procedure: CYSTOSCOPY WITH EXAM UNDER ANESTHESIA;  Surgeon: Festus Aloe, MD;  [facility];  Service: Urology;  Laterality: N/A;   CYSTOSCOPY  2020   CYSTOSCOPY WITH HYDRODISTENSION AND BIOPSY  04/14/2012   Procedure: CYSTOSCOPY/BIOPSY/HYDRODISTENSION;  Surgeon: Hanley Ben, MD;  Location: [place];  Service: Urology;;  instillation of marcaine and pyridium   EUA/  PAP SMEAR/  BREAST EXAM  03-12-2016   dr Ihor Dow [facility]   EUA/ VAGINOSCOPY/ COLPOSCOPY FO THE HYMENAL RING  10-04-2009    dr Delsa Sale  [place] & MGMT  07/29/2019   IR RADIOLOGIST EVAL & MGMT  09/30/2019   IR SCLEROTHERAPY OF A FLUID COLLECTION  09/08/2019   RADIOLOGY WITH ANESTHESIA N/A 09/08/2019   Procedure: IR Sclerotherapy Treatment;  Surgeon: Radiologist, Medication, MD;  Location: [place];  Service: Radiology;  Laterality: N/A;   TONSILLECTOMY     Patient Active Problem List   Diagnosis Date Noted   Moderate persistent asthma, uncomplicated 99991111   Vitamin D deficiency 10/09/2021   Encounter for long-term (current) use of high-risk medication 12/29/2020   Menorrhagia 12/16/2020  UTI symptoms 04/10/2020   Renal cyst 11/29/2019   Intertrigo 11/29/2019   Complex care coordination 05/18/2019   Chronic bladder pain 03/03/2019   Screening for cervical cancer 02/26/2019   Glucosuria 06/01/2018   Steroid-induced diabetes ([place]) 06/01/2018   Not well controlled moderate persistent asthma 04/29/2018   Gastroesophageal reflux disease 04/29/2018   Recurrent infections 04/29/2018   Dysuria 02/20/2018   Chronic rhinitis 04/03/2017   Recurrent falls 03/21/2017   Weakness 03/21/2017   Venous insufficiency 03/21/2017   OSA on CPAP 09/30/2016   Excessive daytime sleepiness 03/05/2016   Insomnia 10/04/2015   Acute recurrent pansinusitis 07/11/2015   Bronchitis 05/05/2015   Abnormality of gait 03/22/2015   Disruptive behavior  disorder 03/22/2015   Cough variant asthma vs UACS/ vcd  03/15/2015   Pneumonia 02/14/2015   Autism spectrum disorder 02/14/2015   Nausea with vomiting 02/14/2015   Obesity, morbid ([place]) 12/13/2014   Mental retardation, 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   Acanthosis nigricans 11/14/2014   Toeing-in 08/29/2014   Generalized convulsive epilepsy ([place]) 09/28/2012   Partial epilepsy with impairment of consciousness ([place]) 09/28/2012   Cognitive developmental delay 09/28/2012   Recurrent urinary tract infection 08/01/2011   COPD with asthma ([place]) 08/01/2011   Chronic constipation 08/01/2011   Contraception 08/01/2011   Encopresis with constipation and overflow incontinence 02/15/2011   Functional urinary incontinence 02/02/2010   Other convulsions 09/05/2009    ONSET DATE: Referral date 09-05-21  REFERRING DIAG: Diagnosis G40.309 (ICD-10-CM) - Generalized convulsive epilepsy ([place]) F81.9 (ICD-10-CM) - Cognitive developmental delay E66.01 (ICD-10-CM) - Obesity, morbid ([place]) F84.0 (ICD-10-CM) - Autism spectrum disorder R26.9 (ICD-10-CM) - Abnormality of gait R29.6 (ICD-10-CM) - Recurrent falls   THERAPY DIAG:  Other abnormalities of gait and mobility  Muscle weakness (generalized)  Unsteadiness on feet  Rationale for Evaluation and Treatment Rehabilitation  SUBJECTIVE:                                                                                                                                                                                              SUBJECTIVE STATEMENT: [name] reports no changes since previous treatment session last week  Pt accompanied by:  mother, Lori Miles  PERTINENT HISTORY: Autism spectrum disorder, h/o generalized convulsive epilepsy, steroid induced diabetes, h/o seizures, moderate intellectual disability  PAIN:  Are you having pain? No - pt does not respond when asks if  she has pain; mother states she has swelling in both legs  PRECAUTIONS: None  WEIGHT BEARING RESTRICTIONS No  FALLS: Has patient fallen in last 6 months?  No  LIVING ENVIRONMENT: Lives with: lives with their family Lives in: House/apartment Stairs: No Has following equipment at home: Environmental consultant - 4 wheeled, Wheelchair (manual), and Tour manager  PLOF: Independent with household mobility with device, Needs assistance with ADLs, and Needs assistance with homemaking  Needs assistance with community ambulation (supervision needed due to decreased cognition)  PATIENT GOALS increase activity to increase strength; improve balance and endurance  OBJECTIVE:  TREATMENT 11-26-21:    STAIRS:  Level of Assistance:  SBA  Stair Negotiation Technique: Step to Pattern Alternating Pattern  with Bilateral Rails  Number of Stairs: 4 X 3 reps = 12 steps total  Height of Stairs: 6  Comments: pt ascended steps using step over step sequence and descended steps using step by step  GAIT: Gait pattern: step through pattern; LLE internally rotated with increased supination noted in Lt foot in stance Distance walked: 350' x 1 reps:  230' on 2nd rep of gait training = 570' total distance Assistive device utilized: None Level of assistance: SBA Comments: LLE continues to internally rotate  THEREX:  Pt performed 10 times sit to stand from high low mat table with feet on Airex without UE support from high low mat table  Leg press 40# 3 sets 10 reps bil. LE's  SciFit level 3.0 x 5" with bil. UE's and LE's - cues to try to maintain at least 60 steps/min for cardiovascular conditioning  Standing hip flexion, abduction and extension with 3# weight 10 reps each direction each leg - UE support on chair   NEURORE-ED:  Rockerboard anterior/posteriorly inside // bars 10 reps with UE support on // bars  Cone taps to 4 cones with min HHA to improve SLS on each leg;  Pt jumped over the 4 cones 2 reps - SBA for  safety; tap ups to each cone with CGA 2 reps each foot to each cone - to improve SLS  Pt performed SLS balance activity - with 6 stepping stones of various sizes - min HHA for safety - pt stepped on each stone to improve SLS and feedforward balance reaction  Pt stood on Airex by mat table - played Zoomball for standing balance on compliant surface - pt did not use mat table for assist with balance with this activity  PATIENT EDUCATION: Education details: discussed playing zoom ball at home in standing for improved balance Person educated: Parent Education method: Explanation Education comprehension: verbalized understanding   HOME EXERCISE PROGRAM: To be issued    GOALS: Goals reviewed with patient? Yes  SHORT TERM GOALS: Target date: 10-22-21 (3 weeks);  extended out due to lack of attendance - 11-12-21  Amb. 460' nonstop without device with SBA to demo increased endurance. Baseline: 230' with SBA Goal status:  Ongoing  2.  Improve TUG score to </= 13.5 secs without device to decrease fall risk.  Baseline: 15.91 secs Goal status: Ongoing  3.  Perform HEP for balance and strengthening with mother's assist.  Baseline:  Goal status: Ongoing   LONG TERM GOALS: Target date: 12-15-21 extended out due to first tx session on 11-06-21 - 6 weeks  Pt will amb. 600' without device on flat, even surface without LOB with SBA. Baseline: 230'  Goal status: INITIAL  2.  Perform at least 10" on recumbent bike nonstop for improved endurance/activity.  Baseline: 3 1/2" prior to resting with doing SciFit exercise Goal status: INITIAL  3.  Improve TUG score to </= 11.5 secs without device to decrease fall risk.  Baseline: 15.91 secs without device Baseline:  Goal status: INITIAL  4.  Pt will perform sit to stand 5 times without use of UE support from mat table. Baseline: 1 rep without UE support at initial eval Goal status: INITIAL  5.  Provide updated HEP as appropriate to be performed  with mother's assist. Baseline:  Goal status: INITIAL  ASSESSMENT:  CLINICAL IMPRESSION: PT session focused on strengthening and balance training on compliant and also on noncompliant surfaces.  Pt had no LOB with zoom ball activity standing on Airex.  Pt able to jump over cones with bil. LE's as in previous treatment session with no LOB.  Pt is progressing well towards goals.   Cont with POC.     OBJECTIVE IMPAIRMENTS Abnormal gait, decreased activity tolerance, decreased balance, decreased cognition, decreased endurance, and decreased strength.   ACTIVITY LIMITATIONS carrying, lifting, bending, squatting, stairs, and locomotion level  PARTICIPATION LIMITATIONS: meal prep, cleaning, laundry, and shopping  PERSONAL FACTORS Behavior pattern, Fitness, Past/current experiences, Time since onset of injury/illness/exacerbation, and 1 comorbidity: Behavior patterns/cognition  are also affecting patient's functional outcome.   REHAB POTENTIAL: Fair   CLINICAL DECISION MAKING: Stable/uncomplicated  EVALUATION COMPLEXITY: Low  PLAN: PT FREQUENCY: 1x/week  PT DURATION: 6 weeks  PLANNED INTERVENTIONS: Therapeutic exercises, Therapeutic activity, Neuromuscular re-education, Balance training, Gait training, Patient/Family education, Self Care, and Stair training  PLAN FOR NEXT SESSION: LE strengthening and balance training   Lori Miles, Lori Miles, PT 11/26/2021, 7:35 PM

## 2021-11-29 DIAGNOSIS — J4520 Mild intermittent asthma, uncomplicated: Secondary | ICD-10-CM | POA: Diagnosis not present

## 2021-12-04 DIAGNOSIS — N390 Urinary tract infection, site not specified: Secondary | ICD-10-CM | POA: Diagnosis not present

## 2021-12-04 DIAGNOSIS — R1110 Vomiting, unspecified: Secondary | ICD-10-CM | POA: Diagnosis not present

## 2021-12-04 DIAGNOSIS — R269 Unspecified abnormalities of gait and mobility: Secondary | ICD-10-CM | POA: Diagnosis not present

## 2021-12-04 DIAGNOSIS — K219 Gastro-esophageal reflux disease without esophagitis: Secondary | ICD-10-CM | POA: Diagnosis not present

## 2021-12-04 DIAGNOSIS — J4540 Moderate persistent asthma, uncomplicated: Secondary | ICD-10-CM | POA: Diagnosis not present

## 2021-12-04 DIAGNOSIS — E559 Vitamin D deficiency, unspecified: Secondary | ICD-10-CM | POA: Diagnosis not present

## 2021-12-04 DIAGNOSIS — L304 Erythema intertrigo: Secondary | ICD-10-CM | POA: Diagnosis not present

## 2021-12-04 DIAGNOSIS — D508 Other iron deficiency anemias: Secondary | ICD-10-CM | POA: Diagnosis not present

## 2021-12-10 ENCOUNTER — Other Ambulatory Visit (INDEPENDENT_AMBULATORY_CARE_PROVIDER_SITE_OTHER): Payer: Self-pay | Admitting: Family

## 2021-12-10 DIAGNOSIS — G40209 Localization-related (focal) (partial) symptomatic epilepsy and epileptic syndromes with complex partial seizures, not intractable, without status epilepticus: Secondary | ICD-10-CM

## 2021-12-10 DIAGNOSIS — G40309 Generalized idiopathic epilepsy and epileptic syndromes, not intractable, without status epilepticus: Secondary | ICD-10-CM

## 2021-12-17 ENCOUNTER — Ambulatory Visit: Payer: Medicare Other | Admitting: Physical Therapy

## 2021-12-19 ENCOUNTER — Telehealth: Payer: Self-pay

## 2021-12-19 NOTE — Telephone Encounter (Signed)
Called and spoke with patient's mother, told her I had Dr. Para March schedule available and she will be operating on 12/5.  Patient's mother states Dr. Elly Modena told her that she would complete her exam and pap smear at the same time when the patient is having a procedure done by her GI doctor so she does not have to be put to sleep twice.  (Message sent to Dr. Elly Modena to confirm)  Patient's mother also states she has a dentist appointment on 12/5. Explained to her that I would send a message to Dr. Elly Modena to confirm the possibility of a combo case, I would also go ahead and schedule her on 12/5 since that is Dr. Domenic Schwab ONLY OR day and if Dr. Elly Modena is unable to do a combo case with her GI doctor, she will need to consider rescheduling her dentist appointment to make the 12/5 surgery date work, or she will have to be scheduled out into January 2024.

## 2021-12-21 ENCOUNTER — Telehealth: Payer: Self-pay

## 2021-12-21 NOTE — Telephone Encounter (Signed)
Called patient's mother, there seems to be a disconnect from what I am trying to telling her versus what she thinks is feasible. Also patient's mother continued to talk over me as I was trying to relay a message from Dr. Elly Modena to her.  Advised patient that Dr. Elly Modena is ONLY operating on 12/5, she could either keep that date, or be rescheduled in January 2024. Tried to explain to her that it is NOT always feasible to get 2 different doctors together to operate. Advised her when she goes to see her GI doctor, to let him know that Dr. Elly Modena has [facility] scheduled on 12/5 and see if we will be able to do her procedure then.  Patient's mother continued to talk over me saying she has a dentist appointment on that day, tried to explain to her a dentist appointment is much easier to reschedule and accommodate versus a surgery and she needs to consider rescheduling her dentist appointment to another day as December 5 is the ONLY day I have available for Dr. Elly Modena and available OR time. Patient's mother states she is not cancelling that appointment.  Patient mother's states "Dr. Elly Modena told her there would be multiple days she operates and there was even nurses in the room to hear her say that and she needs to figure something out."  Asked patient's mother would she like for me to cancel her surgery on December 5th as she stated she is not going to cancel the dentist appointment. She said she will keep the scheduled surgery appointment on 12/5. Advised her if she no shows or cancels without giving [name] a 72hr notice she will be charge a $100 no show fee.

## 2021-12-21 NOTE — Telephone Encounter (Signed)
-----   Message from Mora Bellman, MD sent at 12/20/2021  8:17 AM EDT ----- Regarding: RE: Surgery in December Good morning  I told her that in the ideal world it would be nice to combine both procedure but it is not always feasible because of our schedule. She can either keep 12/5 or reschedule it  Thank  you for checking in  Marcus  ----- Message ----- From: Francia Greaves Sent: 12/19/2021   2:50 PM EDT To: Mora Bellman, MD Subject: Surgery in December                            This patient mother's said you mention something about doing this EUA & pap when she has her procedure w/ her GI MD?

## 2021-12-24 ENCOUNTER — Encounter: Payer: Self-pay | Admitting: Physical Therapy

## 2021-12-24 ENCOUNTER — Ambulatory Visit: Payer: Medicare Other | Attending: Family | Admitting: Physical Therapy

## 2021-12-24 DIAGNOSIS — M6281 Muscle weakness (generalized): Secondary | ICD-10-CM | POA: Insufficient documentation

## 2021-12-24 DIAGNOSIS — R2689 Other abnormalities of gait and mobility: Secondary | ICD-10-CM | POA: Diagnosis not present

## 2021-12-24 DIAGNOSIS — R2681 Unsteadiness on feet: Secondary | ICD-10-CM | POA: Diagnosis not present

## 2021-12-24 NOTE — Therapy (Signed)
OUTPATIENT PHYSICAL THERAPY NEURO TREATMENT NOTE   Patient Name: Lori Miles MRN: [id number] DOB:1995-04-13, 26 y.o., female Today's Date: 12/24/2021   PCP: Clementeen Graham, PA-C REFERRING PROVIDER: Elveria Rising, NP    PT End of Session - 12/24/21 0853     Visit Number 5    Number of Visits 7    Date for PT Re-Evaluation 11/23/21    Authorization Type [employer] Medicare    Authorization Time Period 10-01-21 - 12-15-21; 12-24-21 - 01-24-22    PT Start Time 0755    PT Stop Time 0840    PT Time Calculation (min) 45 min    Activity Tolerance Patient tolerated treatment well    Behavior During Therapy [facility] for tasks assessed/performed               Past Medical History:  Diagnosis Date   Asthma    Autism    mom states no actual dx, but admits she has some symptoms   Bacteriuria    Chronic constipation    Cognitive developmental delay    Constipation    COVID-19 03/2020   Disruptive behavior disorder    Dyspnea    if she walks much   Family history of adverse reaction to anesthesia     "hives"-? if anesthesia did take antibiotic   Frequency-urgency syndrome    Gait disorder    ORGANIC PER NERUOLGIST NOTE (DR HICKLING)   Generalized convulsive epilepsy (HCC) NEUROLOGIST-  DR HICKLING   GERD (gastroesophageal reflux disease)    Gout    H/O sinus tachycardia    History of acute respiratory failure 02/14/2015   SECONDARY TO CAP   History of recurrent UTIs    Incontinent of feces    Interstitial cystitis    Intertrigo 11/29/2019   Moderate intellectual disability    OSA (obstructive sleep apnea)    no machine yet    Partial epilepsy with impairment of consciousness (HCC)    FOLLOWED BY DR HICKLING   Pneumonia 2    4 times this year ( 2020)   PONV (postoperative nausea and vomiting)    Renal cyst 11/29/2019   Seizure (HCC)    most recent sz " at the end of summer"-last sz years ago per mother   Urinary incontinence    Past Surgical History:  Procedure  Laterality Date   CYSTOSCOPY N/A 06/03/2017   Procedure: CYSTOSCOPY WITH EXAM UNDER ANESTHESIA;  Surgeon: Jerilee Field, MD;  [facility];  Service: Urology;  Laterality: N/A;   CYSTOSCOPY  2020   CYSTOSCOPY WITH HYDRODISTENSION AND BIOPSY  04/14/2012   Procedure: CYSTOSCOPY/BIOPSY/HYDRODISTENSION;  Surgeon: Lindaann Slough, MD;  Location: [facility];  Service: Urology;;  instillation of marcaine and pyridium   EUA/  PAP SMEAR/  BREAST EXAM  03-12-2016   dr Erin Fulling [facility]   EUA/ VAGINOSCOPY/ COLPOSCOPY FO THE HYMENAL RING  10-04-2009    dr Tamela Oddi  [facility]   IR RADIOLOGIST EVAL & MGMT  07/29/2019   IR RADIOLOGIST EVAL & MGMT  09/30/2019   IR SCLEROTHERAPY OF A FLUID COLLECTION  09/08/2019   RADIOLOGY WITH ANESTHESIA N/A 09/08/2019   Procedure: IR Sclerotherapy Treatment;  Surgeon: Radiologist, Medication, MD;  Location: [facility] OR;  Service: Radiology;  Laterality: N/A;   TONSILLECTOMY     Patient Active Problem List   Diagnosis Date Noted   Moderate persistent asthma, uncomplicated 10/09/2021   Vitamin D deficiency 10/09/2021   Encounter for long-term (current) use of high-risk medication 12/29/2020  Menorrhagia 12/16/2020   UTI symptoms 04/10/2020   Renal cyst 11/29/2019   Intertrigo 11/29/2019   Complex care coordination 05/18/2019   Chronic bladder pain 03/03/2019   Screening for cervical cancer 02/26/2019   Glucosuria 06/01/2018   Steroid-induced diabetes ([place]) 06/01/2018   Not well controlled moderate persistent asthma 04/29/2018   Gastroesophageal reflux disease 04/29/2018   Recurrent infections 04/29/2018   Dysuria 02/20/2018   Chronic rhinitis 04/03/2017   Recurrent falls 03/21/2017   Weakness 03/21/2017   Venous insufficiency 03/21/2017   OSA on CPAP 09/30/2016   Excessive daytime sleepiness 03/05/2016   Insomnia 10/04/2015   Acute recurrent pansinusitis 07/11/2015   Bronchitis 05/05/2015   Abnormality of gait 03/22/2015    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   Cough variant asthma vs UACS/ vcd  03/15/2015   Pneumonia 02/14/2015   Autism spectrum disorder 02/14/2015   Nausea with vomiting 02/14/2015   Obesity, morbid ([place]) 12/13/2014   Mental retardation, 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/2011   Chronic constipation 08/01/2011   Contraception 08/01/2011   Encopresis with constipation and overflow incontinence 02/15/2011   Functional urinary incontinence 02/02/2010   Other convulsions 09/05/2009    ONSET DATE: Referral date 09-05-21  REFERRING DIAG: Diagnosis G40.309 (ICD-10-CM) - Generalized convulsive epilepsy ([place]) F81.9 (ICD-10-CM) - Cognitive developmental delay E66.01 (ICD-10-CM) - Obesity, morbid ([place]) F84.0 (ICD-10-CM) - Autism spectrum disorder R26.9 (ICD-10-CM) - Abnormality of gait R29.6 (ICD-10-CM) - Recurrent falls   THERAPY DIAG:  Other abnormalities of gait and mobility - Plan: PT plan of care cert/re-cert  Muscle weakness (generalized) - Plan: PT plan of care cert/re-cert  Unsteadiness on feet - Plan: PT plan of care cert/re-cert  Rationale for Evaluation and Treatment Rehabilitation  SUBJECTIVE:                                                                                                                                                                                              SUBJECTIVE STATEMENT: Pt reports no falls and no changes since previous visit 4 weeks ago; Mother reports pt has had another UTI and has been on antibiotics; pt has not attended PT in past month due to appt. Conflicts and lack of appt  availability for preferred day and time  Pt accompanied by:  mother, [name]  PERTINENT  HISTORY: Autism spectrum disorder, h/o generalized convulsive epilepsy, steroid induced diabetes, h/o seizures, moderate intellectual disability  PAIN:  Are you having pain? No - pt does not respond when asks if she has pain; mother states she has swelling in both legs  PRECAUTIONS: None  WEIGHT BEARING RESTRICTIONS No  FALLS: Has patient fallen in last 6 months? No  LIVING ENVIRONMENT: Lives with: lives with their family Lives in: House/apartment Stairs: No Has following equipment at home: Environmental consultant - 4 wheeled, Wheelchair (manual), and Tour manager  PLOF: Independent with household mobility with device, Needs assistance with ADLs, and Needs assistance with homemaking  Needs assistance with community ambulation (supervision needed due to decreased cognition)  PATIENT GOALS increase activity to increase strength; improve balance and endurance  OBJECTIVE:  TREATMENT 11-26-21:    STAIRS:  Level of Assistance:  SBA  Stair Negotiation Technique: Step to Pattern Alternating Pattern  with Bilateral Rails  Number of Stairs: 4 X 3 reps = 12 steps total  Height of Stairs: 6  Comments: pt ascended steps using step over step sequence and descended steps using step by step  GAIT: Gait pattern: step through pattern; LLE internally rotated with increased supination noted in Lt foot in stance Distance walked: 230' x 1 reps:  230' on 2nd rep of gait training =460' total distance Assistive device utilized: None Level of assistance: SBA Comments: LLE continues to internally rotate  THEREX:    Leg press 50# 3 sets 10 reps bil. LE's - seat at 12  SciFit level 3.0 x 5" with bil. UE's and LE's - cues to try to maintain at least 60 steps/min for cardiovascular conditioning  Standing hip flexion, abduction and extension with 3# weight 10 reps each direction each leg - UE support on  chair   NEURORE-ED:  Rockerboard anterior/posteriorly inside // bars 10 reps with UE support on // bars  Cone taps to 4 cones with min HHA to improve SLS on each leg;  Pt jumped over the 4 cones 2 reps - SBA for safety; tap ups to each cone with CGA 2 reps each foot to each cone - to improve SLS  Pt performed SLS balance activity - with 6 stepping stones of various sizes - min HHA for safety - pt stepped on each stone to improve SLS and feedforward balance reaction  Pt stood on balance beam perpendicular for improved hip strategy - pt performed alternate lifting of  RUE and LLE 5 reps each  PATIENT EDUCATION: Education details: POC - LTG's Person educated: Parent Education method: Explanation Education comprehension: verbalized understanding   HOME EXERCISE PROGRAM: To be issued    GOALS: Goals reviewed with patient? Yes  SHORT TERM GOALS: Target date: 10-22-21 (3 weeks);  extended out due to lack of attendance - 11-12-21  Amb. 460' nonstop without device with SBA to demo increased endurance. Baseline: 230' with SBA Goal status:  Ongoing  2.  Improve TUG score to </= 13.5 secs without device to decrease fall risk.  Baseline: 15.91 secs Goal status: Ongoing  3.  Perform HEP for balance and strengthening with mother's assist.  Baseline:  Goal status: Ongoing   LONG TERM GOALS: Target date: 01-15-22   Pt will amb. 600' without device on flat, even surface without LOB with SBA. Baseline: 230'  Goal status: PROGRESSING  2.  Perform at least 10" on recumbent bike nonstop for improved endurance/activity.  Baseline: 3 1/2" prior to resting with doing SciFit exercise Goal status: PROGRESSING  3.  Improve TUG score to </= 11.5 secs without device to decrease fall risk.  Baseline: 15.91 secs without device Baseline:  Goal status: PROGRESSING  4.  Pt will perform sit to stand 5 times without use of UE support from mat table. Baseline: 1 rep without UE support at initial  eval Goal status: PROGRESSING  5.  Provide updated HEP as appropriate to be performed with mother's assist. Baseline:  Goal status:  PROGRESSING  ASSESSMENT:  CLINICAL IMPRESSION: LTG's are in progress with pt not having attended PT since 11-26-21 (total of 3 PT sessions attended since initial eval in July).  Pt is progressing well - performance is improved with encouragement positive feedback.  No LTG's fully achieved at this time - renewal completed for 4 additional weeks for 3 additional sessions including today's session.  Cont with POC.     OBJECTIVE IMPAIRMENTS Abnormal gait, decreased activity tolerance, decreased balance, decreased cognition, decreased endurance, and decreased strength.   ACTIVITY LIMITATIONS carrying, lifting, bending, squatting, stairs, and locomotion level  PARTICIPATION LIMITATIONS: meal prep, cleaning, laundry, and shopping  PERSONAL FACTORS Behavior pattern, Fitness, Past/current experiences, Time since onset of injury/illness/exacerbation, and 1 comorbidity: Behavior patterns/cognition  are also affecting patient's functional outcome.   REHAB POTENTIAL: Fair   CLINICAL DECISION MAKING: Stable/uncomplicated  EVALUATION COMPLEXITY: Low  PLAN: PT FREQUENCY: 1x/week  PT DURATION: 6 weeks  PLANNED INTERVENTIONS: Therapeutic exercises, Therapeutic activity, Neuromuscular re-education, Balance training, Gait training, Patient/Family education, Self Care, and Stair training  PLAN FOR NEXT SESSION: LE strengthening and balance training   Cherylyn Sundby, Donavan Burnet, PT 12/24/2021, 9:12 AM

## 2021-12-25 ENCOUNTER — Encounter: Payer: Self-pay | Admitting: Internal Medicine

## 2021-12-25 ENCOUNTER — Ambulatory Visit (INDEPENDENT_AMBULATORY_CARE_PROVIDER_SITE_OTHER): Payer: Medicare Other | Admitting: Internal Medicine

## 2021-12-25 VITALS — BP 90/70 | HR 103 | Ht <= 58 in | Wt 159.0 lb

## 2021-12-25 DIAGNOSIS — K219 Gastro-esophageal reflux disease without esophagitis: Secondary | ICD-10-CM

## 2021-12-25 DIAGNOSIS — K5909 Other constipation: Secondary | ICD-10-CM

## 2021-12-25 DIAGNOSIS — R112 Nausea with vomiting, unspecified: Secondary | ICD-10-CM

## 2021-12-25 DIAGNOSIS — K625 Hemorrhage of anus and rectum: Secondary | ICD-10-CM | POA: Diagnosis not present

## 2021-12-25 NOTE — Progress Notes (Signed)
   Subjective:    Patient ID: Lori Miles, female    DOB: Jul 30, 1995, 26 y.o.   MRN: [id number]  HPI Lori Miles is a 26 year old female with a history of GERD, chronic constipation, seizure disorder, frequent UTI, sleep apnea and autism who is here for follow-up.  She is here today with her mother.  She was last seen in February 2022.  An issue over the last 4 to 6 weeks is regular red blood per rectum.  She continues on senna 2 tablets daily and twice daily MiraLAX.  Bowel movements have been softer and formed without the need for significant straining.  No diarrhea.  She is seeing red blood with wiping as well as mixed with stool.  She is also seen some stool in her depends.  She has had intermittent vaginal bleeding as well but she is confident that most of the blood is per rectum.  Dr. Elly Modena will be performing EUA with Pap smear on 02/19/2022.  She continues to have intermittent nausea for which Zofran works well.  Famotidine 20 mg every 12 hours is controlling her reflux.  No frequent abdominal pain.  No recent seizure and seizure disorder is apparently well controlled.   Review of Systems As per HPI, otherwise negative  Current Medications, Allergies, Past Medical History, Past Surgical History, Family History and Social History were reviewed in [employer] record.    Objective:   Physical Exam BP 90/70   Pulse (!) 103   Ht 4' 9.5" (1.461 m)   Wt 159 lb (72.1 kg)   BMI 33.81 kg/m  Gen: awake, alert, NAD HEENT: anicteric  CV: RRR, no mrg Pulm: CTA b/l Abd: soft, NT/ND, +BS throughout Ext: no c/c/e Neuro: nonfocal     Latest Ref Rng & Units 11/14/2021    5:24 PM 03/26/2020    8:52 PM 03/08/2020   10:30 AM  CBC  WBC 4.0 - 10.5 K/uL 17.8  6.1  10.0   Hemoglobin 12.0 - 15.0 g/dL 12.6  12.4  11.4   Hematocrit 36.0 - 46.0 % 38.2  37.5  34.5   Platelets 150 - 400 K/uL 282  191  188        Assessment & Plan:   26 year old female with a  history of GERD, chronic constipation, seizure disorder, frequent UTI, sleep apnea and autism who is here for follow-up.   Rectal bleeding --ongoing and mixed with stool despite constipation being under control.  Fortunately hemoglobin as of about 6 weeks ago was normal.  We discussed further evaluation with colonoscopy with MAC.  We will reviewed the risk, benefits and alternatives and her mother is agreeable and wishes to proceed -- Colonoscopy in the [place]  2.  Chronic constipation --under control with twice daily MiraLAX and senna 2 tablets daily.  We will continue this regimen for now  3.  GERD with history of chronic cough --continue famotidine 20 mg twice daily  4.  Chronic nausea --Zofran 4 mg every 6-8 hours as needed  30 minutes total spent today including patient facing time, coordination of care, reviewing medical history/procedures/pertinent radiology studies, and documentation of the encounter.

## 2021-12-25 NOTE — Patient Instructions (Signed)
_______________________________________________________  If you are age 27 or older, your body mass index should be between 23-30. Your Body mass index is 33.81 kg/m. If this is out of the aforementioned range listed, please consider follow up with your Primary Care Provider.  If you are age 53 or younger, your body mass index should be between 19-25. Your Body mass index is 33.81 kg/m. If this is out of the aformentioned range listed, please consider follow up with your Primary Care Provider.   ________________________________________________________  The [facility] providers would like to encourage you to use [facility] to communicate with providers for non-urgent requests or questions.  Due to long hold times on the telephone, sending your provider a message by [facility] may be a faster and more efficient way to get a response.  Please allow 48 business hours for a response.  Please remember that this is for non-urgent requests.  _______________________________________________________  Continue Senna, Miralax, Pepcid, and Zofran  You have been scheduled for a colonoscopy. Please follow written instructions given to you at your visit today.  Please pick up your prep supplies at the pharmacy within the next 1-3 days. If you use inhalers (even only as needed), please bring them with you on the day of your procedure.

## 2021-12-26 ENCOUNTER — Other Ambulatory Visit (INDEPENDENT_AMBULATORY_CARE_PROVIDER_SITE_OTHER): Payer: Self-pay | Admitting: Family

## 2021-12-26 DIAGNOSIS — G4700 Insomnia, unspecified: Secondary | ICD-10-CM

## 2021-12-27 NOTE — Telephone Encounter (Signed)
Last seen 10/09/21 due f/u in jan  Requested front contact patient to sched

## 2022-01-02 DIAGNOSIS — J4520 Mild intermittent asthma, uncomplicated: Secondary | ICD-10-CM | POA: Diagnosis not present

## 2022-01-08 ENCOUNTER — Ambulatory Visit: Payer: Medicare Other | Admitting: Physical Therapy

## 2022-01-08 DIAGNOSIS — M6281 Muscle weakness (generalized): Secondary | ICD-10-CM

## 2022-01-08 DIAGNOSIS — R2681 Unsteadiness on feet: Secondary | ICD-10-CM

## 2022-01-08 DIAGNOSIS — R2689 Other abnormalities of gait and mobility: Secondary | ICD-10-CM

## 2022-01-08 NOTE — Therapy (Signed)
OUTPATIENT PHYSICAL THERAPY NEURO TREATMENT NOTE   Patient Name: Lori Miles MRN: [id number] DOB:1995/12/25, 26 y.o., female Today's Date: 01/09/2022   PCP: Maude Leriche, PA-C REFERRING PROVIDER: Rockwell Germany, NP    PT End of Session - 01/09/22 1945     Visit Number 6    Number of Visits 7    Date for PT Re-Evaluation 11/23/21    Authorization Type U[employer] Medicare    Authorization Time Period 10-01-21 - 12-15-21; 12-24-21 - 01-24-22    PT Start Time 0800    PT Stop Time 0850    PT Time Calculation (min) 50 min    Activity Tolerance Patient tolerated treatment well    Behavior During Therapy [name] for tasks assessed/performed                Past Medical History:  Diagnosis Date   Asthma    Autism    mom states no actual dx, but admits she has some symptoms   Bacteriuria    Chronic constipation    Cognitive developmental delay    Constipation    COVID-19 03/2020   Disruptive behavior disorder    Dyspnea    if she walks much   Family history of adverse reaction to anesthesia     "hives"-? if anesthesia did take antibiotic   Frequency-urgency syndrome    Gait disorder    ORGANIC PER NERUOLGIST NOTE (DR HICKLING)   Generalized convulsive epilepsy ([place]) NEUROLOGIST-  DR HICKLING   GERD (gastroesophageal reflux disease)    Gout    H/O sinus tachycardia    History of acute respiratory failure 02/14/2015   SECONDARY TO CAP   History of recurrent UTIs    Incontinent of feces    Interstitial cystitis    Intertrigo 11/29/2019   Moderate intellectual disability    OSA (obstructive sleep apnea)    no machine yet    Partial epilepsy with impairment of consciousness ([place])    FOLLOWED BY DR HICKLING   Pneumonia 2    4 times this year ( 2020)   PONV (postoperative nausea and vomiting)    Renal cyst 11/29/2019   Seizure ([place])    most recent sz " at the end of summer"-last sz years ago per mother   Urinary incontinence    Past Surgical History:  Procedure  Laterality Date   CYSTOSCOPY N/A 06/03/2017   Procedure: CYSTOSCOPY WITH EXAM UNDER ANESTHESIA;  Surgeon: Festus Aloe, MD;  Location: [facility];  Service: Urology;  Laterality: N/A;   CYSTOSCOPY  2020   CYSTOSCOPY WITH HYDRODISTENSION AND BIOPSY  04/14/2012   Procedure: CYSTOSCOPY/BIOPSY/HYDRODISTENSION;  Surgeon: Hanley Ben, MD;  Location: [place];  Service: Urology;;  instillation of marcaine and pyridium   EUA/  PAP SMEAR/  BREAST EXAM  03-12-2016   dr Ihor Dow [facility]   EUA/ VAGINOSCOPY/ COLPOSCOPY FO THE HYMENAL RING  10-04-2009    dr Delsa Sale  [place] & MGMT  07/29/2019   IR RADIOLOGIST EVAL & MGMT  09/30/2019   IR SCLEROTHERAPY OF A FLUID COLLECTION  09/08/2019   RADIOLOGY WITH ANESTHESIA N/A 09/08/2019   Procedure: IR Sclerotherapy Treatment;  Surgeon: Radiologist, Medication, MD;  Location: [place];  Service: Radiology;  Laterality: N/A;   TONSILLECTOMY     Patient Active Problem List   Diagnosis Date Noted   Moderate persistent asthma, uncomplicated 99991111   Vitamin D deficiency 10/09/2021   Encounter for long-term (current) use of high-risk medication  12/29/2020   Menorrhagia 12/16/2020   UTI symptoms 04/10/2020   Renal cyst 11/29/2019   Intertrigo 11/29/2019   Complex care coordination 05/18/2019   Chronic bladder pain 03/03/2019   Screening for cervical cancer 02/26/2019   Glucosuria 06/01/2018   Steroid-induced diabetes ([place]) 06/01/2018   Not well controlled moderate persistent asthma 04/29/2018   Gastroesophageal reflux disease 04/29/2018   Recurrent infections 04/29/2018   Dysuria 02/20/2018   Chronic rhinitis 04/03/2017   Recurrent falls 03/21/2017   Weakness 03/21/2017   Venous insufficiency 03/21/2017   OSA on CPAP 09/30/2016   Excessive daytime sleepiness 03/05/2016   Insomnia 10/04/2015   Acute recurrent pansinusitis 07/11/2015   Bronchitis 05/05/2015   Abnormality of gait 03/22/2015    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   Cough variant asthma vs UACS/ vcd  03/15/2015   Pneumonia 02/14/2015   Autism spectrum disorder 02/14/2015   Nausea with vomiting 02/14/2015   Obesity, morbid ([place]) 12/13/2014   Mental retardation, 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   Chronic constipation 08/01/2011   Contraception 08/01/2011   Encopresis with constipation and overflow incontinence 02/15/2011   Functional urinary incontinence 02/02/2010   Other convulsions 09/05/2009    ONSET DATE: Referral date 09-05-21  REFERRING DIAG: Diagnosis G40.309 (ICD-10-CM) - Generalized convulsive epilepsy ([place]) F81.9 (ICD-10-CM) - Cognitive developmental delay E66.01 (ICD-10-CM) - Obesity, morbid ([place]) F84.0 (ICD-10-CM) - Autism spectrum disorder R26.9 (ICD-10-CM) - Abnormality of gait R29.6 (ICD-10-CM) - Recurrent falls   THERAPY DIAG:  Other abnormalities of gait and mobility  Muscle weakness (generalized)  Unsteadiness on feet  Rationale for Evaluation and Treatment Rehabilitation  SUBJECTIVE:                                                                                                                                                                                              SUBJECTIVE STATEMENT: [name] reports no changes - states pt had another UTI with 2 different bacteriums and had to be given Keflex  Pt accompanied by:  mother, [name]  PERTINENT HISTORY: Autism spectrum disorder, h/o generalized convulsive epilepsy, steroid induced diabetes, h/o seizures, moderate intellectual disability  PAIN:  Are  you having pain? No - pt does not respond when asks if she has pain; mother states she has swelling in both legs  PRECAUTIONS: None  WEIGHT BEARING RESTRICTIONS No  FALLS: Has patient fallen in last 6 months? No  LIVING ENVIRONMENT: Lives with: lives with their family Lives in: House/apartment Stairs: No Has following equipment at home: Environmental consultant - 4 wheeled, Wheelchair (manual), and Electronics engineer  PLOF: Independent with household mobility with device, Needs assistance with ADLs, and Needs assistance with homemaking  Needs assistance with community ambulation (supervision needed due to decreased cognition)  PATIENT GOALS increase activity to increase strength; improve balance and endurance  OBJECTIVE:  TREATMENT 01-08-22    STAIRS:  Level of Assistance:  SBA  Stair Negotiation Technique: Step to Pattern Alternating Pattern  with Bilateral Rails  Number of Stairs: 4 X 3 reps = 12 steps total  Height of Stairs: 6  Comments: pt ascended steps using step over step sequence and descended steps using step by step  GAIT: Gait pattern: step through pattern; LLE internally rotated with increased supination noted in Lt foot in stance Distance walked: 230' x 1 rep:  230' on 2nd rep of gait training =460' total distance Assistive device utilized: None Level of assistance: SBA Comments: LLE continues to internally rotate  THEREX:    Leg press 50# 3 sets 10 reps bil. LE's - seat at 12  SciFit level 3.0 x 6" with bil. UE's and LE's - cues to try to maintain at least 60 steps/min for cardiovascular conditioning  Standing hip flexion, abduction and extension with 3# weight 10 reps each direction each leg - UE support on chair   NEURORE-ED:  Rockerboard anterior/posteriorly inside // bars 10 reps with UE support on // bars  Cone taps to 3 cones with min HHA to improve SLS on each leg;  Pt jumped over  4 cones 2 reps - SBA for safety; tap ups to each cone with CGA 2 reps each foot to  each cone - to improve SLS  Pt performed SLS balance activity - with 6 stepping stones of various sizes - min HHA for safety - pt stepped on each stone to improve SLS and feedforward balance reaction  Pt stood on balance beam perpendicular for improved hip strategy - pt performed alternate lifting of  RUE and LLE 5 reps each  Stepping stones of various heights to increase SLS and feedforward balance reactions - CGA to min HHA given for support and to decrease fear of LOB with this activity  PATIENT EDUCATION: Education details: POC - LTG's Person educated: Parent Education method: Explanation Education comprehension: verbalized understanding   HOME EXERCISE PROGRAM: To be issued    GOALS: Goals reviewed with patient? Yes  SHORT TERM GOALS: Target date: 10-22-21 (3 weeks);  extended out due to lack of attendance - 11-12-21  Amb. 460' nonstop without device with SBA to demo increased endurance. Baseline: 230' with SBA Goal status:  Ongoing  2.  Improve TUG score to </= 13.5 secs without device to decrease fall risk.  Baseline: 15.91 secs Goal status: Ongoing  3.  Perform HEP for balance and strengthening with mother's assist.  Baseline:  Goal status: Ongoing   LONG TERM GOALS: Target date: 01-15-22   Pt will amb. 600' without device on flat, even surface without LOB with SBA. Baseline: 230'  Goal status: PROGRESSING  2.  Perform at least 10" on recumbent bike nonstop for improved endurance/activity.  Baseline: 3 1/2" prior to resting with doing SciFit exercise Goal status: PROGRESSING  3.  Improve TUG score to </= 11.5 secs without device to decrease fall risk.  Baseline: 15.91 secs without device Baseline:  Goal status: PROGRESSING  4.  Pt will perform sit to stand 5 times without use of UE support from mat table. Baseline: 1 rep without UE support at initial eval Goal status: PROGRESSING  5.  Provide updated HEP as appropriate to be performed with mother's  assist. Baseline:  Goal status:  PROGRESSING  ASSESSMENT:  CLINICAL IMPRESSION: PT session focused on balance training to facilitate SLS on each leg and strengthening exercises for bil. LE's. Pt did well maintaining balance with all activities.  LLE continues to internally rotate during swing phase of gait but pt able to externally rotate LLE with verbal cues.  Cont with POC.    OBJECTIVE IMPAIRMENTS Abnormal gait, decreased activity tolerance, decreased balance, decreased cognition, decreased endurance, and decreased strength.   ACTIVITY LIMITATIONS carrying, lifting, bending, squatting, stairs, and locomotion level  PARTICIPATION LIMITATIONS: meal prep, cleaning, laundry, and shopping  PERSONAL FACTORS Behavior pattern, Fitness, Past/current experiences, Time since onset of injury/illness/exacerbation, and 1 comorbidity: Behavior patterns/cognition  are also affecting patient's functional outcome.   REHAB POTENTIAL: Fair   CLINICAL DECISION MAKING: Stable/uncomplicated  EVALUATION COMPLEXITY: Low  PLAN: PT FREQUENCY: 1x/week  PT DURATION: 6 weeks  PLANNED INTERVENTIONS: Therapeutic exercises, Therapeutic activity, Neuromuscular re-education, Balance training, Gait training, Patient/Family education, Self Care, and Stair training  PLAN FOR NEXT SESSION: D/C next session; LE strengthening and balance training   Kenith Trickel, [place], PT 01/09/2022, 7:48 PM

## 2022-01-09 ENCOUNTER — Encounter: Payer: Self-pay | Admitting: Physical Therapy

## 2022-01-14 ENCOUNTER — Encounter: Payer: Self-pay | Admitting: Physical Therapy

## 2022-01-14 ENCOUNTER — Ambulatory Visit: Payer: Medicare Other | Admitting: Physical Therapy

## 2022-01-14 DIAGNOSIS — R2681 Unsteadiness on feet: Secondary | ICD-10-CM

## 2022-01-14 DIAGNOSIS — M6281 Muscle weakness (generalized): Secondary | ICD-10-CM

## 2022-01-14 DIAGNOSIS — R2689 Other abnormalities of gait and mobility: Secondary | ICD-10-CM

## 2022-01-14 NOTE — Therapy (Signed)
OUTPATIENT PHYSICAL THERAPY NEURO TREATMENT NOTE/DISCHARGE SUMMARY   Patient Name: Lori Miles MRN: [id number] DOB:[birth year], 26 y.o., female Today's Date: 01/14/2022   PCP: Maude Leriche, PA-C REFERRING PROVIDER: Rockwell Germany, NP    PT End of Session - 01/14/22 0901     Visit Number 7    Number of Visits 7    Date for PT Re-Evaluation 11/23/21    Authorization Type UHC Medicare    Authorization Time Period 10-01-21 - 12-15-21; 12-24-21 - 01-24-22    PT Start Time 0758    PT Stop Time 0845    PT Time Calculation (min) 47 min    Activity Tolerance Patient tolerated treatment well    Behavior During Therapy [facility] for tasks assessed/performed                 Past Medical History:  Diagnosis Date   Asthma    Autism    mom states no actual dx, but admits she has some symptoms   Bacteriuria    Chronic constipation    Cognitive developmental delay    Constipation    COVID-19 03/2020   Disruptive behavior disorder    Dyspnea    if she walks much   Family history of adverse reaction to anesthesia     "hives"-? if anesthesia did take antibiotic   Frequency-urgency syndrome    Gait disorder    ORGANIC PER NERUOLGIST NOTE (DR HICKLING)   Generalized convulsive epilepsy ([place]) NEUROLOGIST-  DR HICKLING   GERD (gastroesophageal reflux disease)    Gout    H/O sinus tachycardia    History of acute respiratory failure 02/14/2015   SECONDARY TO CAP   History of recurrent UTIs    Incontinent of feces    Interstitial cystitis    Intertrigo 11/29/2019   Moderate intellectual disability    OSA (obstructive sleep apnea)    no machine yet    Partial epilepsy with impairment of consciousness ([name])    FOLLOWED BY DR HICKLING   Pneumonia 2    4 times this year ( 2020)   PONV (postoperative nausea and vomiting)    Renal cyst 11/29/2019   Seizure ([place])    most recent sz " at the end of summer"-last sz years ago per mother   Urinary incontinence    Past Surgical  History:  Procedure Laterality Date   CYSTOSCOPY N/A 06/03/2017   Procedure: CYSTOSCOPY WITH EXAM UNDER ANESTHESIA;  Surgeon: Festus Aloe, MD;  Location: [facility];  Service: Urology;  Laterality: N/A;   CYSTOSCOPY  2020   CYSTOSCOPY WITH HYDRODISTENSION AND BIOPSY  04/14/2012   Procedure: CYSTOSCOPY/BIOPSY/HYDRODISTENSION;  Surgeon: Hanley Ben, MD;  Location: [place];  Service: Urology;;  instillation of marcaine and pyridium   EUA/  PAP SMEAR/  BREAST EXAM  03-12-2016   dr Ihor Dow [facility]   EUA/ VAGINOSCOPY/ COLPOSCOPY FO THE HYMENAL RING  10-04-2009    dr Delsa Sale  [place] & MGMT  07/29/2019   IR RADIOLOGIST EVAL & MGMT  09/30/2019   IR SCLEROTHERAPY OF A FLUID COLLECTION  09/08/2019   RADIOLOGY WITH ANESTHESIA N/A 09/08/2019   Procedure: IR Sclerotherapy Treatment;  Surgeon: Radiologist, Medication, MD;  Location: [place];  Service: Radiology;  Laterality: N/A;   TONSILLECTOMY     Patient Active Problem List   Diagnosis Date Noted   Moderate persistent asthma, uncomplicated 41/66/0630   Vitamin D deficiency 10/09/2021   Encounter for long-term (current) use of  high-risk medication 12/29/2020   Menorrhagia 12/16/2020   UTI symptoms 04/10/2020   Renal cyst 11/29/2019   Intertrigo 11/29/2019   Complex care coordination 05/18/2019   Chronic bladder pain 03/03/2019   Screening for cervical cancer 02/26/2019   Glucosuria 06/01/2018   Steroid-induced diabetes ([place]) 06/01/2018   Not well controlled moderate persistent asthma 04/29/2018   Gastroesophageal reflux disease 04/29/2018   Recurrent infections 04/29/2018   Dysuria 02/20/2018   Chronic rhinitis 04/03/2017   Recurrent falls 03/21/2017   Weakness 03/21/2017   Venous insufficiency 03/21/2017   OSA on CPAP 09/30/2016   Excessive daytime sleepiness 03/05/2016   Insomnia 10/04/2015   Acute recurrent pansinusitis 07/11/2015   Bronchitis 05/05/2015   Abnormality of  gait 03/22/2015   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   Cough variant asthma vs UACS/ vcd  03/15/2015   Pneumonia 02/14/2015   Autism spectrum disorder 02/14/2015   Nausea with vomiting 02/14/2015   Obesity, morbid ([place]) 12/13/2014   Mental retardation, 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   Chronic constipation 08/01/2011   Contraception 08/01/2011   Encopresis with constipation and overflow incontinence 02/15/2011   Functional urinary incontinence 02/02/2010   Other convulsions 09/05/2009    ONSET DATE: Referral date 09-05-21  REFERRING DIAG: Diagnosis G40.309 (ICD-10-CM) - Generalized convulsive epilepsy ([place]) F81.9 (ICD-10-CM) - Cognitive developmental delay E66.01 (ICD-10-CM) - Obesity, morbid ([place]) F84.0 (ICD-10-CM) - Autism spectrum disorder R26.9 (ICD-10-CM) - Abnormality of gait R29.6 (ICD-10-CM) - Recurrent falls   THERAPY DIAG:  Other abnormalities of gait and mobility  Muscle weakness (generalized)  Unsteadiness on feet  Rationale for Evaluation and Treatment Rehabilitation  SUBJECTIVE:                                                                                                                                                                                              SUBJECTIVE STATEMENT: [name] reports no changes - states pt had another UTI with 2 different bacteriums and had to be given Keflex  Pt accompanied by:  mother, [name]  PERTINENT HISTORY: Autism spectrum disorder, h/o generalized convulsive epilepsy, steroid induced diabetes, h/o seizures, moderate intellectual  disability  PAIN:  Are you having pain? No - pt does not respond when asks if she has pain; mother states she has swelling in both  legs  PRECAUTIONS: None  WEIGHT BEARING RESTRICTIONS No  FALLS: Has patient fallen in last 6 months? No  LIVING ENVIRONMENT: Lives with: lives with their family Lives in: House/apartment Stairs: No Has following equipment at home: Environmental consultant - 4 wheeled, Wheelchair (manual), and Electronics engineer  PLOF: Independent with household mobility with device, Needs assistance with ADLs, and Needs assistance with homemaking  Needs assistance with community ambulation (supervision needed due to decreased cognition)  PATIENT GOALS increase activity to increase strength; improve balance and endurance   OBJECTIVE:  TREATMENT 01-14-22    STAIRS:  Level of Assistance:  SBA  Stair Negotiation Technique: Step to Pattern Alternating Pattern  with Bilateral Rails  Number of Stairs: 4   Height of Stairs: 6  Comments: pt ascended steps using step over step sequence and descended steps using step by step  GAIT: Gait pattern: step through pattern; LLE internally rotated with increased supination noted in Lt foot in stance Distance walked: 600'  Assistive device utilized: None Level of assistance: SBA Comments: LLE continues to internally rotate   THEREX:  Pt performed sit to stand from high low mat table without UE support 5 reps for LTG assessment - pt able to perform without use of UE's   Leg press 50# 3 sets 10 reps bil. LE's - seat at 12  SciFit level 2.0 x 10" with bil. UE's and LE's - cues to try to maintain at least 60 steps/min for cardiovascular conditioning  Step ups with RLE onto 6" step with 1 UE support on hand rail   NEURORE-ED:  TUG score 13.37 secs without device  Rockerboard anterior/posteriorly inside // bars 10 reps with UE support on // bars  Cone taps to 3 cones with min HHA to improve SLS on each leg;  Pt jumped over  4 cones 2 reps -  SBA for safety; tap ups to each cone with CGA 2 reps each foot to each cone - to improve SLS   PATIENT EDUCATION: Education details: POC - LTG's Person educated: Parent Education method: Explanation Education comprehension: verbalized understanding   HOME EXERCISE PROGRAM: To be issued    GOALS: Goals reviewed with patient? Yes  SHORT TERM GOALS: Target date: 10-22-21 (3 weeks);  extended out due to lack of attendance - 11-12-21  Amb. 460' nonstop without device with SBA to demo increased endurance. Baseline: 230' with SBA Goal status:  Ongoing  2.  Improve TUG score to </= 13.5 secs without device to decrease fall risk.  Baseline: 15.91 secs Goal status: Ongoing  3.  Perform HEP for balance and strengthening with mother's assist.  Baseline:  Goal status: Ongoing   LONG TERM GOALS: Target date: 01-15-22   Pt will amb. 600' without device on flat, even surface without LOB with SBA. Baseline: 230':  600' without device Goal status: Goal met 01-14-22  2.  Perform at least 10" on recumbent bike nonstop for improved endurance/activity.  Baseline: 3 1/2" prior to resting with doing SciFit exercise Goal status: Goal met - pt took short seated rest period after initial 5", but then able to resume exercise on SciFit for 5" additional for 10" total time  3.  Improve TUG score to </= 11.5 secs without device to decrease fall risk.  Baseline: 15.91 secs without device; 13.37 secs without device Goal status: Partially met 01-14-22  4.  Pt will perform sit to stand 5 times without use of UE support from mat table. Baseline: 1 rep without UE support at  initial eval Goal status: Goal met 01-14-22  5.  Provide updated HEP as appropriate to be performed with mother's assist. Baseline:  Goal status:  Goal met 01-14-22  ASSESSMENT:  CLINICAL IMPRESSION: Pt has met LTG's #1, 2, 4 and 5:  LTG #3 partially met as TUG score has improved from 15.91 secs to 13.37 secs, but not to stated  goal of 11.5 secs.  Pt is discharged due to completion of program and goals met - pt has maximized functional progress at this time - no further needs identified.     OBJECTIVE IMPAIRMENTS Abnormal gait, decreased activity tolerance, decreased balance, decreased cognition, decreased endurance, and decreased strength.   ACTIVITY LIMITATIONS carrying, lifting, bending, squatting, stairs, and locomotion level  PARTICIPATION LIMITATIONS: meal prep, cleaning, laundry, and shopping  PERSONAL FACTORS Behavior pattern, Fitness, Past/current experiences, Time since onset of injury/illness/exacerbation, and 1 comorbidity: Behavior patterns/cognition  are also affecting patient's functional outcome.   REHAB POTENTIAL: Fair   CLINICAL DECISION MAKING: Stable/uncomplicated  EVALUATION COMPLEXITY: Low  PLAN: PT FREQUENCY: 1x/week  PT DURATION: 6 weeks  PLANNED INTERVENTIONS: Therapeutic exercises, Therapeutic activity, Neuromuscular re-education, Balance training, Gait training, Patient/Family education, Self Care, and Stair training  PLAN FOR NEXT SESSION: D/C 01-14-22    PHYSICAL THERAPY DISCHARGE SUMMARY  Visits from Start of Care: 7  Current functional level related to goals / functional outcomes: See above for progress towards LTG's   Remaining deficits: Continued decreased high level balance skills and mild gait deviation with LLE internally rotating during swing    Education / Equipment: Pt and mother have been instructed in HEP for strengthening and balance activities to do together at home such as cone taps, Zoom ball in tall kneeling and stepping stones which pt's mother has ordered from online    Patient agrees to discharge. Patient goals were met. Patient is being discharged due to meeting the stated rehab goals.    Alda Lea, PT 01/14/2022, 9:06 AM

## 2022-02-10 DIAGNOSIS — J4520 Mild intermittent asthma, uncomplicated: Secondary | ICD-10-CM | POA: Diagnosis not present

## 2022-02-12 DIAGNOSIS — N390 Urinary tract infection, site not specified: Secondary | ICD-10-CM | POA: Diagnosis not present

## 2022-02-19 ENCOUNTER — Encounter (HOSPITAL_BASED_OUTPATIENT_CLINIC_OR_DEPARTMENT_OTHER): Payer: Self-pay

## 2022-02-19 ENCOUNTER — Ambulatory Visit (HOSPITAL_BASED_OUTPATIENT_CLINIC_OR_DEPARTMENT_OTHER): Admit: 2022-02-19 | Payer: Medicare Other | Admitting: Obstetrics and Gynecology

## 2022-02-19 SURGERY — EXAM UNDER ANESTHESIA
Anesthesia: Choice

## 2022-02-20 ENCOUNTER — Telehealth: Payer: Self-pay | Admitting: Internal Medicine

## 2022-02-20 ENCOUNTER — Telehealth: Payer: Self-pay | Admitting: Allergy and Immunology

## 2022-02-20 NOTE — Telephone Encounter (Signed)
Inbound call from patients mother requesting a call back to discuss patients upcoming procedure with Dr. Rhea Belton on 12/12

## 2022-02-20 NOTE — Telephone Encounter (Signed)
Patient request a call from the Nurse patient would not give reason please advise

## 2022-02-21 NOTE — Telephone Encounter (Addendum)
Patient's mother has questions regarding vegetables prior to colonoscopy scheduled for 02-26-22.  Instructed mother that patient can have cooked vegetables 5 days prior to procedure.  Mother instructed that patient should be on clear liquids the day prior and the day of the procedure.  Mother verbalized understanding and had no other questions.

## 2022-02-21 NOTE — Telephone Encounter (Signed)
Inbound call from patient mom needing to be advised on prep instructions.  Please advise, thank you .

## 2022-02-21 NOTE — Telephone Encounter (Signed)
I called and spoke with the patients mother and she stated that she did return Lori Miles's call yesterday but had a hard time getting through. She stated that she called back three times and 2 of those three times it sounded like somebody picked up the phone and slammed it down and hung up on her. She stated that the 3rd time it seemed like she was in queue for an hour and a half and finally she hung up. I advised that I would let my manager and boss know about this situation and apologized. Patients mother stated that she thought Lori Miles had a follow up appointment in January but there wasn't one scheduled. I advised that her last appointment scheduled was in August but it was cancelled. She stated it may have been cancelled because the mother fell and has been having some issues. She stated that Lori Miles has a lot of upcoming appointments including surgery in February. She stated that she tried to fill one of her nebulizer medications and that there was not anymore refills because she needed an appointment. She stated that she called our office and someone told her she would scheduled her for January and send in a refill. She stated that she was able to pick up the medication but that there was not an appointment scheduled for Lori Miles to see Lori Miles in January. I did go ahead and get her scheduled for an appointment in January to see Lori Miles and advised mom that if she has any issues with getting any other medications refilled to call and let me know and that we can get her refills until her appointment in January if there are any issues. I did also remind her that we have moved and advised the best way to come into the new office. Patients mother verbalized understanding and will call back if she needs anything further.

## 2022-02-25 NOTE — Telephone Encounter (Signed)
Mother wanted to know when she could give her daughter the pills that she has to take in the am. Discussed with her that the pills need to be given prior to 8am. Pts mother aware.

## 2022-02-25 NOTE — Telephone Encounter (Signed)
Inbound call from patient mother requesting to speak with a nurse.

## 2022-02-26 ENCOUNTER — Encounter: Payer: Self-pay | Admitting: Internal Medicine

## 2022-02-26 ENCOUNTER — Ambulatory Visit (AMBULATORY_SURGERY_CENTER): Payer: Medicare Other | Admitting: Internal Medicine

## 2022-02-26 VITALS — BP 99/68 | HR 75 | Temp 98.7°F | Resp 29 | Ht <= 58 in | Wt 159.0 lb

## 2022-02-26 DIAGNOSIS — Z951 Presence of aortocoronary bypass graft: Secondary | ICD-10-CM | POA: Diagnosis not present

## 2022-02-26 DIAGNOSIS — K640 First degree hemorrhoids: Secondary | ICD-10-CM

## 2022-02-26 DIAGNOSIS — K5909 Other constipation: Secondary | ICD-10-CM | POA: Diagnosis not present

## 2022-02-26 DIAGNOSIS — K625 Hemorrhage of anus and rectum: Secondary | ICD-10-CM

## 2022-02-26 DIAGNOSIS — M1990 Unspecified osteoarthritis, unspecified site: Secondary | ICD-10-CM | POA: Diagnosis not present

## 2022-02-26 DIAGNOSIS — G4733 Obstructive sleep apnea (adult) (pediatric): Secondary | ICD-10-CM | POA: Diagnosis not present

## 2022-02-26 DIAGNOSIS — Z91013 Allergy to seafood: Secondary | ICD-10-CM | POA: Diagnosis not present

## 2022-02-26 DIAGNOSIS — Z79899 Other long term (current) drug therapy: Secondary | ICD-10-CM | POA: Diagnosis not present

## 2022-02-26 DIAGNOSIS — Z7982 Long term (current) use of aspirin: Secondary | ICD-10-CM | POA: Diagnosis not present

## 2022-02-26 DIAGNOSIS — Z8673 Personal history of transient ischemic attack (TIA), and cerebral infarction without residual deficits: Secondary | ICD-10-CM | POA: Diagnosis not present

## 2022-02-26 MED ORDER — SODIUM CHLORIDE 0.9 % IV SOLN: 500.0000 mL | Freq: Once | INTRAVENOUS | Status: DC

## 2022-02-26 NOTE — Progress Notes (Signed)
Vss nad trans to pacu °

## 2022-02-26 NOTE — Op Note (Signed)
[facility] Patient Name: Lori Miles Procedure Date: 02/26/2022 11:01 AM MRN: [id number] Endoscopist: Beverley Fiedler , MD, [id number] Age: 26 Referring MD:  Date of Birth: 1995/05/09 Gender: Female Account #: [id number] Procedure:                Colonoscopy Indications:              Rectal bleeding (intermittent), chronic constipation Medicines:                Monitored Anesthesia Care Procedure:                Pre-Anesthesia Assessment:                           - Prior to the procedure, a History and Physical                            was performed, and patient medications and                            allergies were reviewed. The patient's tolerance of                            previous anesthesia was also reviewed. The risks                            and benefits of the procedure and the sedation                            options and risks were discussed with the patient.                            All questions were answered, and informed consent                            was obtained. Prior Anticoagulants: The patient has                            taken no anticoagulant or antiplatelet agents. ASA                            Grade Assessment: III - A patient with severe                            systemic disease. After reviewing the risks and                            benefits, the patient was deemed in satisfactory                            condition to undergo the procedure.                           After obtaining informed consent, the colonoscope  was passed under direct vision. Throughout the                            procedure, the patient's blood pressure, pulse, and                            oxygen saturations were monitored continuously. The                            CF [id number] #[id number] was introduced through the anus                            and advanced to the cecum, identified by                             appendiceal orifice and ileocecal valve. The                            colonoscopy was performed without difficulty. The                            patient tolerated the procedure well. The quality                            of the bowel preparation was good. The ileocecal                            valve, appendiceal orifice, and rectum were                            photographed. Scope In: 11:21:16 AM Scope Out: 11:34:39 AM Scope Withdrawal Time: 0 hours 8 minutes 46 seconds  Total Procedure Duration: 0 hours 13 minutes 23 seconds  Findings:                 The digital rectal exam was normal.                           The colon (entire examined portion) appeared normal.                           Internal hemorrhoids were found during                            retroflexion. The hemorrhoids were small. Complications:            No immediate complications. Estimated Blood Loss:     Estimated blood loss: none. Impression:               - The entire examined colon is normal.                           - Small internal hemorrhoids. Intermittent rectal                            bleeding most likely secondary to hemorrhoids  and/or fissure in setting of chronic constipation.                           - No specimens collected. Recommendation:           - Patient has a contact number available for                            emergencies. The signs and symptoms of potential                            delayed complications were discussed with the                            patient. Return to normal activities tomorrow.                            Written discharge instructions were provided to the                            patient.                           - Resume previous diet.                           - Continue present medications.                           - Repeat colonoscopy age 15 for screening purposes. Beverley Fiedler, MD 02/26/2022 11:37:56 AM This report  has been signed electronically.

## 2022-02-26 NOTE — Progress Notes (Signed)
GASTROENTEROLOGY PROCEDURE H&P NOTE   Primary Care Physician: Scifres, [name], PA-C (Inactive)    Reason for Procedure:  Rectal bleeding  Plan:    Colonoscopy  Patient is appropriate for endoscopic procedure(s) in the ambulatory ([facility]) setting.  The nature of the procedure, as well as the risks, benefits, and alternatives were carefully and thoroughly reviewed with the patient. Ample time for discussion and questions allowed. The patient understood, was satisfied, and agreed to proceed.     HPI: Lori Miles is a 26 y.o. female who presents for colonoscopy.  Medical history as below.  Tolerated the prep.  No recent chest pain or shortness of breath.  No abdominal pain today.  Past Medical History:  Diagnosis Date   Asthma    Autism    mom states no actual dx, but admits she has some symptoms   Bacteriuria    Chronic constipation    Cognitive developmental delay    Constipation    COVID-19 03/2020   Disruptive behavior disorder    Dyspnea    if she walks much   Family history of adverse reaction to anesthesia     "hives"-? if anesthesia did take antibiotic   Frequency-urgency syndrome    Gait disorder    ORGANIC PER NERUOLGIST NOTE (DR HICKLING)   Generalized convulsive epilepsy (HCC) NEUROLOGIST-  DR HICKLING   GERD (gastroesophageal reflux disease)    Gout    H/O sinus tachycardia    History of acute respiratory failure 02/14/2015   SECONDARY TO CAP   History of recurrent UTIs    Incontinent of feces    Interstitial cystitis    Intertrigo 11/29/2019   Moderate intellectual disability    OSA (obstructive sleep apnea)    no machine yet    Partial epilepsy with impairment of consciousness (HCC)    FOLLOWED BY DR HICKLING   Pneumonia 2    4 times this year ( 2020)   PONV (postoperative nausea and vomiting)    Renal cyst 11/29/2019   Seizure (HCC)    most recent sz " at the end of summer"-last sz years ago per mother   Urinary incontinence     Past  Surgical History:  Procedure Laterality Date   CYSTOSCOPY N/A 06/03/2017   Procedure: CYSTOSCOPY WITH EXAM UNDER ANESTHESIA;  Surgeon: Jerilee Field, MD;  Location: [facility];  Service: Urology;  Laterality: N/A;   CYSTOSCOPY  2020   CYSTOSCOPY WITH HYDRODISTENSION AND BIOPSY  04/14/2012   Procedure: CYSTOSCOPY/BIOPSY/HYDRODISTENSION;  Surgeon: Lindaann Slough, MD;  Location: [facility];  Service: Urology;;  instillation of marcaine and pyridium   EUA/  PAP SMEAR/  BREAST EXAM  03-12-2016   dr Erin Fulling [facility]   EUA/ VAGINOSCOPY/ COLPOSCOPY FO THE HYMENAL RING  10-04-2009    dr Tamela Oddi  [facility]   IR RADIOLOGIST EVAL & MGMT  07/29/2019   IR RADIOLOGIST EVAL & MGMT  09/30/2019   IR SCLEROTHERAPY OF A FLUID COLLECTION  09/08/2019   RADIOLOGY WITH ANESTHESIA N/A 09/08/2019   Procedure: IR Sclerotherapy Treatment;  Surgeon: Radiologist, Medication, MD;  Location: [facility];  Service: Radiology;  Laterality: N/A;   TONSILLECTOMY      Prior to Admission medications   Medication Sig Start Date End Date Taking? Authorizing Provider  acetaminophen (TYLENOL) 325 MG tablet Take 2 tablets (650 mg total) by mouth every 6 (six) hours as needed for mild pain or moderate pain. 02/27/17  Yes Everlene Farrier, PA-C  cetirizine (ZYRTEC) 10  MG tablet Take 1 tablet (10 mg total) by mouth 2 (two) times daily as needed for allergies (Can use an extra dose during flare ups). 10/09/21  Yes Kozlow, Donnamarie Poag, MD  DEPAKOTE 500 MG DR tablet TAKE 1 TABLET BY MOUTH IN THE MORNING AND 2 AT NIGHT 12/12/21  Yes Goodpasture, Otila Kluver, NP  famotidine (PEPCID) 20 MG tablet TAKE 1 TABLET(20 MG) BY MOUTH EVERY 12 HOURS 01/23/21  Yes Kozlow, Donnamarie Poag, MD  hydrOXYzine (ATARAX/VISTARIL) 25 MG tablet Take 25 mg by mouth 3 (three) times daily as needed (pain).  01/13/19  Yes [provider]  ipratropium-albuterol (DUONEB) 0.5-2.5 (3) MG/3ML SOLN Take 3 mLs by nebulization every 4 (four) hours as needed. 10/09/21   Yes Kozlow, Donnamarie Poag, MD  melatonin 3 MG TABS tablet Take 2 tablets (6mg ) by mouth at bedtime. 01/28/20  Yes Goodpasture, Otila Kluver, NP  montelukast (SINGULAIR) 10 MG tablet Take 1 tablet (10 mg total) by mouth at bedtime. TAKE 1 TABLET(10 MG) BY MOUTH AT BEDTIME 10/09/21  Yes Kozlow, Donnamarie Poag, MD  ondansetron (ZOFRAN ODT) 4 MG disintegrating tablet Take 2 tablets (8 mg total) by mouth every 8 (eight) hours as needed for nausea or vomiting. 09/10/20  Yes Henderly, Britni A, PA-C  polyethylene glycol powder (GLYCOLAX/MIRALAX) 17 GM/SCOOP powder DISSOLVE 1 CAPFUL(17 GRAMS) IN AT LEAST 8 OUNCES OF WATER/JUICE TWICE DAILY 08/14/21  Yes Delyle Weider, Lajuan Lines, MD  PULMICORT 1 MG/2ML nebulizer solution USE 1 VIAL IN NEBULIZER 4 TIMES DAILY DURING RESPITORY INFECTIONS AND ASTHMA FLARES 10/29/21  Yes Kozlow, Donnamarie Poag, MD  SEASONIQUE 0.15-0.03 &0.01 MG tablet Take 1 tablet by mouth daily. 11/06/21  Yes Constant, Peggy, MD  senna (SENOKOT) 8.6 MG tablet Take 1 tablet by mouth at bedtime.    Yes [provider]  traZODone (DESYREL) 50 MG tablet TAKE 2 TABLETS(100 MG) BY MOUTH AT BEDTIME 12/27/21  Yes Goodpasture, Otila Kluver, NP  albuterol (PROVENTIL) (2.5 MG/3ML) 0.083% nebulizer solution Take 3 mLs (2.5 mg total) by nebulization every 4 (four) hours as needed for wheezing or shortness of breath. 10/09/21   Kozlow, Donnamarie Poag, MD  albuterol (VENTOLIN HFA) 108 (90 Base) MCG/ACT inhaler Inhale 2 puffs into the lungs every 4 (four) hours as needed for wheezing or shortness of breath. Patient not taking: Reported on 12/25/2021 10/09/21   Jiles Prows, MD  amoxicillin-clavulanate (AUGMENTIN) 875-125 MG tablet Take 1 tablet by mouth 2 (two) times daily. Patient not taking: Reported on 12/25/2021    [provider]  Cholecalciferol (VITAMIN D3) 1.25 MG (50000 UT) CAPS Take 1 capsule by mouth once a week. 09/21/20   [provider]  FEROSUL 325 (65 Fe) MG tablet Take 325 mg by mouth daily. 10/01/20   [provider]   fluconazole (DIFLUCAN) 150 MG tablet Take 1 tablet (150 mg total) by mouth as needed (when on antibiotics). 11/06/21   Constant, Peggy, MD  formoterol (PERFOROMIST) 20 MCG/2ML nebulizer solution Take 2 mLs (20 mcg total) by nebulization 2 (two) times daily. USE 1 VIAL VIA NEBULIZER TWICE DAILY 10/09/21   Kozlow, Donnamarie Poag, MD  Nebulizer MISC Adult mask with tubing. Use as directed with nebulizer device. 11/18/18   Kozlow, Donnamarie Poag, MD  nystatin (MYCOSTATIN/NYSTOP) powder Apply 1 application topically 3 (three) times daily. 11/07/20   Constant, Peggy, MD  nystatin powder Apply 1 Application topically 3 (three) times daily. 11/06/21   Constant, Peggy, MD  Olopatadine HCl 0.2 % SOLN Can use one drop in each eye once daily  if needed for red, itching, watery eyes. 01/23/21   Kozlow, Alvira Philips, MD  predniSONE (DELTASONE) 20 MG tablet Take 2 tablets (40 mg total) by mouth daily with breakfast. For the next four days Patient not taking: Reported on 12/25/2021 11/14/21   Gerhard Munch, MD  terconazole (TERAZOL 3) 0.8 % vaginal cream Place 1 applicator vaginally at bedtime. Apply nightly for three nights. 11/06/21   Constant, Peggy, MD    Current Outpatient Medications  Medication Sig Dispense Refill   acetaminophen (TYLENOL) 325 MG tablet Take 2 tablets (650 mg total) by mouth every 6 (six) hours as needed for mild pain or moderate pain. 60 tablet 0   cetirizine (ZYRTEC) 10 MG tablet Take 1 tablet (10 mg total) by mouth 2 (two) times daily as needed for allergies (Can use an extra dose during flare ups). 60 tablet 5   DEPAKOTE 500 MG DR tablet TAKE 1 TABLET BY MOUTH IN THE MORNING AND 2 AT NIGHT 90 tablet 5   famotidine (PEPCID) 20 MG tablet TAKE 1 TABLET(20 MG) BY MOUTH EVERY 12 HOURS 180 tablet 3   hydrOXYzine (ATARAX/VISTARIL) 25 MG tablet Take 25 mg by mouth 3 (three) times daily as needed (pain).      ipratropium-albuterol (DUONEB) 0.5-2.5 (3) MG/3ML SOLN Take 3 mLs by nebulization every 4 (four) hours as needed.  360 mL 2   melatonin 3 MG TABS tablet Take 2 tablets (6mg ) by mouth at bedtime. 60 tablet 0   montelukast (SINGULAIR) 10 MG tablet Take 1 tablet (10 mg total) by mouth at bedtime. TAKE 1 TABLET(10 MG) BY MOUTH AT BEDTIME 30 tablet 5   ondansetron (ZOFRAN ODT) 4 MG disintegrating tablet Take 2 tablets (8 mg total) by mouth every 8 (eight) hours as needed for nausea or vomiting. 20 tablet 0   polyethylene glycol powder (GLYCOLAX/MIRALAX) 17 GM/SCOOP powder DISSOLVE 1 CAPFUL(17 GRAMS) IN AT LEAST 8 OUNCES OF WATER/JUICE TWICE DAILY 1020 g 2   PULMICORT 1 MG/2ML nebulizer solution USE 1 VIAL IN NEBULIZER 4 TIMES DAILY DURING RESPITORY INFECTIONS AND ASTHMA FLARES 240 mL 1   SEASONIQUE 0.15-0.03 &0.01 MG tablet Take 1 tablet by mouth daily. 91 tablet 5   senna (SENOKOT) 8.6 MG tablet Take 1 tablet by mouth at bedtime.      traZODone (DESYREL) 50 MG tablet TAKE 2 TABLETS(100 MG) BY MOUTH AT BEDTIME 62 tablet 2   albuterol (PROVENTIL) (2.5 MG/3ML) 0.083% nebulizer solution Take 3 mLs (2.5 mg total) by nebulization every 4 (four) hours as needed for wheezing or shortness of breath. 150 mL 0   albuterol (VENTOLIN HFA) 108 (90 Base) MCG/ACT inhaler Inhale 2 puffs into the lungs every 4 (four) hours as needed for wheezing or shortness of breath. (Patient not taking: Reported on 12/25/2021) 54 g 0   amoxicillin-clavulanate (AUGMENTIN) 875-125 MG tablet Take 1 tablet by mouth 2 (two) times daily. (Patient not taking: Reported on 12/25/2021)     Cholecalciferol (VITAMIN D3) 1.25 MG (50000 UT) CAPS Take 1 capsule by mouth once a week.     FEROSUL 325 (65 Fe) MG tablet Take 325 mg by mouth daily.     fluconazole (DIFLUCAN) 150 MG tablet Take 1 tablet (150 mg total) by mouth as needed (when on antibiotics). 30 tablet 3   formoterol (PERFOROMIST) 20 MCG/2ML nebulizer solution Take 2 mLs (20 mcg total) by nebulization 2 (two) times daily. USE 1 VIAL VIA NEBULIZER TWICE DAILY 200 mL 5   Nebulizer MISC Adult mask with  tubing. Use as directed with nebulizer device. 4 each 12   nystatin (MYCOSTATIN/NYSTOP) powder Apply 1 application topically 3 (three) times daily. 15 g 6   nystatin powder Apply 1 Application topically 3 (three) times daily. 30 g 0   Olopatadine HCl 0.2 % SOLN Can use one drop in each eye once daily if needed for red, itching, watery eyes. 2.5 mL 5   predniSONE (DELTASONE) 20 MG tablet Take 2 tablets (40 mg total) by mouth daily with breakfast. For the next four days (Patient not taking: Reported on 12/25/2021) 8 tablet 0   terconazole (TERAZOL 3) 0.8 % vaginal cream Place 1 applicator vaginally at bedtime. Apply nightly for three nights. 20 g 3   Current Facility-Administered Medications  Medication Dose Route Frequency Provider Last Rate Last Admin   0.9 %  sodium chloride infusion  500 mL Intravenous Once Evella Kasal, Lajuan Lines, MD        Allergies as of 02/26/2022 - Review Complete 02/26/2022  Allergen Reaction Noted   Abilify [aripiprazole] Swelling and Palpitations 10/11/2012   Seroquel [quetiapine fumarate] Palpitations 09/28/2012   Amoxicillin-pot clavulanate Hives and Other (See Comments) 02/07/2011   Bactrim [sulfamethoxazole-trimethoprim] Other (See Comments) 03/17/2018   Doxycycline Other (See Comments) 02/28/2016   Macrobid [nitrofurantoin macrocrystal] Other (See Comments) 01/21/2019   Keppra [levetiracetam] Other (See Comments) 10/11/2012    Family History  Problem Relation Age of Onset   Seizures Mother    Seizures Sister        1 sister has   Pancreatic cancer Sister    Colon cancer Neg Hx    Colon polyps Neg Hx    Esophageal cancer Neg Hx    Rectal cancer Neg Hx    Stomach cancer Neg Hx     Social History   Socioeconomic History   Marital status: Single    Spouse name: Not on file   Number of children: Not on file   Years of education: Not on file   Highest education level: Not on file  Occupational History   Not on file  Tobacco Use   Smoking status: Never    Smokeless tobacco: Never  Vaping Use   Vaping Use: Never used  Substance and Sexual Activity   Alcohol use: No    Alcohol/week: 0.0 standard drinks of alcohol   Drug use: No   Sexual activity: Never    Birth control/protection: Pill  Other Topics Concern   Not on file  Social History Narrative   Lives with mom ([name] Abt) and half-sister Roxene Sniff) who is also mentally impaired.  Has CAP assistance, urinary incontinence, needs help with feeding,dressing, toileting   Graduated from [employer].  She enjoys playing with cars, dancing, and dogs.   Social Determinants of Health   Financial Resource Strain: Not on file  Food Insecurity: Not on file  Transportation Needs: Not on file  Physical Activity: Not on file  Stress: Not on file  Social Connections: Not on file  Intimate Partner Violence: Not on file    Physical Exam: Vital signs in last 24 hours: @BP  99/67   Pulse (!) 108   Temp 98.7 F (37.1 C)   Ht 4\' 9"  (1.448 m)   Wt 159 lb (72.1 kg)   SpO2 98%   BMI 34.41 kg/m  GEN: NAD EYE: Sclerae anicteric ENT: MMM CV: Non-tachycardic Pulm: CTA b/l GI: Soft, NT/ND NEURO:  Alert & Oriented x 3   Zenovia Jarred, MD [facility]  02/26/2022 11:02  AM

## 2022-02-26 NOTE — Progress Notes (Signed)
Pt's states no medical or surgical changes since previsit or office visit. 

## 2022-02-26 NOTE — Patient Instructions (Addendum)
-   Patient has a contact number available for emergencies. The signs and symptoms of potential delayed complications were discussed with the patient. Return to normal activities tomorrow. Written discharge instructions were provided to the patient. - Resume previous diet. - Continue present medications. - Repeat colonoscopy age 26 for screening purposes.   YOU HAD AN ENDOSCOPIC PROCEDURE TODAY AT THE [facility]:   Refer to the procedure report that was given to you for any specific questions about what was found during the examination.  If the procedure report does not answer your questions, please call your gastroenterologist to clarify.  If you requested that your care partner not be given the details of your procedure findings, then the procedure report has been included in a sealed envelope for you to review at your convenience later.  YOU SHOULD EXPECT: Some feelings of bloating in the abdomen. Passage of more gas than usual.  Walking can help get rid of the air that was put into your GI tract during the procedure and reduce the bloating. If you had a lower endoscopy (such as a colonoscopy or flexible sigmoidoscopy) you may notice spotting of blood in your stool or on the toilet paper. If you underwent a bowel prep for your procedure, you may not have a normal bowel movement for a few days.  Please Note:  You might notice some irritation and congestion in your nose or some drainage.  This is from the oxygen used during your procedure.  There is no need for concern and it should clear up in a day or so.  SYMPTOMS TO REPORT IMMEDIATELY:  Following lower endoscopy (colonoscopy or flexible sigmoidoscopy):  Excessive amounts of blood in the stool  Significant tenderness or worsening of abdominal pains  Swelling of the abdomen that is new, acute  Fever of 100F or higher   For urgent or emergent issues, a gastroenterologist can be reached at any hour by calling [phone]  [phone]. Do not use MyChart messaging for urgent concerns.    DIET:  We do recommend a small meal at first, but then you may proceed to your regular diet.  Drink plenty of fluids but you should avoid alcoholic beverages for 24 hours.  ACTIVITY:  You should plan to take it easy for the rest of today and you should NOT DRIVE or use heavy machinery until tomorrow (because of the sedation medicines used during the test).    FOLLOW UP: Our staff will call the number listed on your records the next business day following your procedure.  We will call around 7:15- 8:00 am to check on you and address any questions or concerns that you may have regarding the information given to you following your procedure. If we do not reach you, we will leave a message.     If any biopsies were taken you will be contacted by phone or by letter within the next 1-3 weeks.  Please call us at [phone] if you have not heard about the biopsies in 3 weeks.    SIGNATURES/CONFIDENTIALITY: You and/or your care partner have signed paperwork which will be entered into your electronic medical record.  These signatures attest to the fact that that the information above on your After Visit Summary has been reviewed and is understood.  Full responsibility of the confidentiality of this discharge information lies with you and/or your care-partner.

## 2022-02-27 ENCOUNTER — Telehealth: Payer: Self-pay | Admitting: *Deleted

## 2022-02-27 NOTE — Telephone Encounter (Signed)
  Follow up Call-     02/26/2022   10:35 AM  Call back number  Post procedure Call Back phone  # [phone]  Permission to leave phone message Yes     Patient questions:  Do you have a fever, pain , or abdominal swelling? No. Pain Score  0 *  Have you tolerated food without any problems? Yes.    Have you been able to return to your normal activities? Yes.    Do you have any questions about your discharge instructions: Diet   No. Medications  No. Follow up visit  No.  Do you have questions or concerns about your Care? No.  Actions: * If pain score is 4 or above: No action needed, pain <4.

## 2022-02-28 ENCOUNTER — Other Ambulatory Visit: Payer: Self-pay | Admitting: Allergy and Immunology

## 2022-03-04 ENCOUNTER — Telehealth: Payer: Self-pay | Admitting: Allergy and Immunology

## 2022-03-04 DIAGNOSIS — N390 Urinary tract infection, site not specified: Secondary | ICD-10-CM | POA: Diagnosis not present

## 2022-03-04 MED ORDER — PULMICORT 1 MG/2ML IN SUSP: RESPIRATORY_TRACT | 0 refills | Status: DC

## 2022-03-04 NOTE — Telephone Encounter (Signed)
Patients mom called and stated that the prescription for pulmicort was called in wrong. She stated that this has happened before. Mom is requesting this prescription be sent in the correct way. Pharmacy is [facility] on [address]. Moms call back number is [phone]

## 2022-03-04 NOTE — Telephone Encounter (Signed)
Called patients mother and expressed to her that the medication was approved for what was sent. Mom states that she is going through a fare up and that the two boxes that were sent are not going to last until her next appointment. I informed mom that we would send in for four boxes but if she is using it so often she would need to come in sooner. Mom expressed that she was seen elsewhere.

## 2022-03-08 DIAGNOSIS — J320 Chronic maxillary sinusitis: Secondary | ICD-10-CM | POA: Diagnosis not present

## 2022-03-08 DIAGNOSIS — L304 Erythema intertrigo: Secondary | ICD-10-CM | POA: Diagnosis not present

## 2022-03-08 DIAGNOSIS — J4540 Moderate persistent asthma, uncomplicated: Secondary | ICD-10-CM | POA: Diagnosis not present

## 2022-03-12 DIAGNOSIS — J4520 Mild intermittent asthma, uncomplicated: Secondary | ICD-10-CM | POA: Diagnosis not present

## 2022-03-12 DIAGNOSIS — J069 Acute upper respiratory infection, unspecified: Secondary | ICD-10-CM | POA: Diagnosis not present

## 2022-03-12 DIAGNOSIS — J0100 Acute maxillary sinusitis, unspecified: Secondary | ICD-10-CM | POA: Diagnosis not present

## 2022-03-16 DIAGNOSIS — R0981 Nasal congestion: Secondary | ICD-10-CM | POA: Diagnosis not present

## 2022-03-16 DIAGNOSIS — R059 Cough, unspecified: Secondary | ICD-10-CM | POA: Diagnosis not present

## 2022-03-16 DIAGNOSIS — Z8709 Personal history of other diseases of the respiratory system: Secondary | ICD-10-CM | POA: Diagnosis not present

## 2022-03-16 DIAGNOSIS — Z03818 Encounter for observation for suspected exposure to other biological agents ruled out: Secondary | ICD-10-CM | POA: Diagnosis not present

## 2022-03-16 DIAGNOSIS — R6889 Other general symptoms and signs: Secondary | ICD-10-CM | POA: Diagnosis not present

## 2022-03-23 DIAGNOSIS — J208 Acute bronchitis due to other specified organisms: Secondary | ICD-10-CM | POA: Diagnosis not present

## 2022-03-25 ENCOUNTER — Telehealth (INDEPENDENT_AMBULATORY_CARE_PROVIDER_SITE_OTHER): Payer: Self-pay | Admitting: Family

## 2022-03-25 NOTE — Telephone Encounter (Signed)
I spoke with [name], patient's mother, this morning and rescheduled patient's 03/26/22 appointment to 05/09/22. Mother requested a Tuesday morning for the appointment if possible. I did not see any Tuesday morning available outside of the [facility].   I advised mother I would ask if this was possible.  Please advise. Cameron Sprang

## 2022-03-26 ENCOUNTER — Ambulatory Visit (INDEPENDENT_AMBULATORY_CARE_PROVIDER_SITE_OTHER): Payer: Medicare Other | Admitting: Family

## 2022-03-27 ENCOUNTER — Ambulatory Visit: Payer: Medicare Other | Admitting: Obstetrics and Gynecology

## 2022-04-02 ENCOUNTER — Ambulatory Visit (INDEPENDENT_AMBULATORY_CARE_PROVIDER_SITE_OTHER): Payer: Medicare Other | Admitting: Allergy and Immunology

## 2022-04-02 ENCOUNTER — Other Ambulatory Visit: Payer: Self-pay

## 2022-04-02 ENCOUNTER — Encounter: Payer: Self-pay | Admitting: Allergy and Immunology

## 2022-04-02 VITALS — BP 100/62 | HR 99 | Temp 98.3°F | Resp 16 | Ht <= 58 in | Wt 149.0 lb

## 2022-04-02 DIAGNOSIS — J4550 Severe persistent asthma, uncomplicated: Secondary | ICD-10-CM

## 2022-04-02 DIAGNOSIS — J3089 Other allergic rhinitis: Secondary | ICD-10-CM

## 2022-04-02 DIAGNOSIS — K219 Gastro-esophageal reflux disease without esophagitis: Secondary | ICD-10-CM | POA: Diagnosis not present

## 2022-04-02 MED ORDER — CETIRIZINE HCL 10 MG PO TABS: 10.0000 mg | ORAL_TABLET | Freq: Two times a day (BID) | ORAL | 5 refills | Status: DC | PRN

## 2022-04-02 MED ORDER — PULMICORT 1 MG/2ML IN SUSP: 1.0000 mg | Freq: Four times a day (QID) | RESPIRATORY_TRACT | 2 refills | Status: DC

## 2022-04-02 MED ORDER — FORMOTEROL FUMARATE 20 MCG/2ML IN NEBU: 20.0000 ug | INHALATION_SOLUTION | Freq: Two times a day (BID) | RESPIRATORY_TRACT | 3 refills | Status: DC

## 2022-04-02 MED ORDER — MONTELUKAST SODIUM 10 MG PO TABS: 10.0000 mg | ORAL_TABLET | Freq: Every evening | ORAL | 5 refills | Status: DC

## 2022-04-02 MED ORDER — FAMOTIDINE 20 MG PO TABS: 20.0000 mg | ORAL_TABLET | Freq: Two times a day (BID) | ORAL | 3 refills | Status: DC

## 2022-04-02 NOTE — Patient Instructions (Signed)
  1. Continue a combination of the following:   A. budesonide 1 mg nebulized twice a day  B. Perforomist nebulized twice a day  C. Nasal saline / gel 2 times per day  D. montelukast 10 mg daily  E. cetirizine 10 mg daily  2. Continue to Treat reflux with a combination of the following:   A. Famotidine 40mg  twice a day  B. No caffeine or chocolate consumption   3. "Action plan" for asthma flare up:   A. increase budesonide to 4 times a day  B. Albuterol or DUONEB nebulization every 4-6 hours if needed  4. If needed:   A. Cetirizine 10 mg - 1 tablet 1 time per day  5. Return to clinic 6 months or earlier if problem.

## 2022-04-02 NOTE — Progress Notes (Signed)
[place] - [place] - [place] - [place] - [place]   Follow-up Note  Referring Provider: No ref. provider found Primary Provider: Scifres, Peter Minium (Inactive) Date of Office Visit: 04/02/2022  Subjective:   Lori Miles (DOB: 1995/09/28) is a 27 y.o. female who [facility] in re-evaluation of the following:  HPI: Lori Miles returns to this clinic in reevaluation of asthma, allergic rhinitis, reflux. I last saw her in this clinic 09 October 2021.  Overall she has done pretty well with her airway and she has only had 1 significant flareup that developed at the very beginning of January 2024 that required 2 antibiotics and systemic steroids and fortunately that has completely resolved and at this point in time she has no problems with her nose or her chest and does not need to use a short acting bronchodilator.  Apparently all of her diagnostic tests for viral infections were negative.  She uses preventative medications for her airway including the use of nebulized budesonide and formoterol and montelukast.  She uses nasal saline.  The inside of her nose also became somewhat irritated during her recent respiratory tract infection especially involving the left side.  In the past she has had an ulcer develop on that side that required the administration of Bactroban.  Her reflux appears to be under very good control with the use of famotidine 1-2 times per day.  She still having significant problems with recurrent urinary tract infections involving Klebsiella, Proteus, E. coli.  She does not receive the flu vaccine.  Allergies as of 04/02/2022       Reactions   Abilify [aripiprazole] Swelling, Palpitations   Seroquel [quetiapine Fumarate] Palpitations   Amoxicillin-pot Clavulanate Hives, Other (See Comments)   Has patient had a PCN reaction causing immediate rash, facial/tongue/throat swelling, SOB or lightheadedness with  hypotension: No Has patient had a PCN reaction causing severe rash involving mucus membranes or skin necrosis:    #  #  YES  #  #  Has patient had a PCN reaction that required hospitalization: No Has patient had a PCN reaction occurring within the last 10 years: No   Bactrim [sulfamethoxazole-trimethoprim] Other (See Comments)   Abdominal pain   Doxycycline Other (See Comments)   Stomach cramps   Macrobid [nitrofurantoin Macrocrystal] Other (See Comments)   Sharp pain/ given with Tylenol 325 mg   Keppra [levetiracetam] Other (See Comments)   Aggressive behavior         Medication List    acetaminophen 325 MG tablet Commonly known as: TYLENOL Take 2 tablets (650 mg total) by mouth every 6 (six) hours as needed for mild pain or moderate pain.   albuterol 108 (90 Base) MCG/ACT inhaler Commonly known as: VENTOLIN HFA Inhale 2 puffs into the lungs every 4 (four) hours as needed for wheezing or shortness of breath.   albuterol (2.5 MG/3ML) 0.083% nebulizer solution Commonly known as: PROVENTIL Take 3 mLs (2.5 mg total) by nebulization every 4 (four) hours as needed for wheezing or shortness of breath.   benzonatate 200 MG capsule Commonly known as: TESSALON Take 200 mg by mouth 3 (three) times daily.   cetirizine 10 MG tablet Commonly known as: ZYRTEC Take 1 tablet (10 mg total) by mouth 2 (two) times daily as needed for allergies (Can use an extra dose during flare ups).   Depakote 500 MG DR tablet Generic drug: divalproex TAKE 1 TABLET BY MOUTH IN THE MORNING AND 2 AT  NIGHT   famotidine 20 MG tablet Commonly known as: PEPCID TAKE 1 TABLET(20 MG) BY MOUTH EVERY 12 HOURS   FeroSul 325 (65 FE) MG tablet Generic drug: ferrous sulfate Take 325 mg by mouth daily.   fluconazole 150 MG tablet Commonly known as: DIFLUCAN Take 1 tablet (150 mg total) by mouth as needed (when on antibiotics).   formoterol 20 MCG/2ML nebulizer solution Commonly known as: Perforomist Take 2  mLs (20 mcg total) by nebulization 2 (two) times daily. USE 1 VIAL VIA NEBULIZER TWICE DAILY   hydrOXYzine 25 MG tablet Commonly known as: ATARAX Take 25 mg by mouth 3 (three) times daily as needed (pain).   ipratropium-albuterol 0.5-2.5 (3) MG/3ML Soln Commonly known as: DUONEB Take 3 mLs by nebulization every 4 (four) hours as needed.   melatonin 3 MG Tabs tablet Take 2 tablets (6mg ) by mouth at bedtime.   montelukast 10 MG tablet Commonly known as: SINGULAIR Take 1 tablet (10 mg total) by mouth at bedtime. TAKE 1 TABLET(10 MG) BY MOUTH AT BEDTIME   Nebulizer Misc Adult mask with tubing. Use as directed with nebulizer device.   nystatin powder Commonly known as: MYCOSTATIN/NYSTOP Apply 1 application topically 3 (three) times daily.   nystatin powder Commonly known as: nystatin Apply 1 Application topically 3 (three) times daily.   Olopatadine HCl 0.2 % Soln Can use one drop in each eye once daily if needed for red, itching, watery eyes.   ondansetron 4 MG disintegrating tablet Commonly known as: Zofran ODT Take 2 tablets (8 mg total) by mouth every 8 (eight) hours as needed for nausea or vomiting.   polyethylene glycol powder 17 GM/SCOOP powder Commonly known as: GLYCOLAX/MIRALAX DISSOLVE 1 CAPFUL(17 GRAMS) IN AT LEAST 8 OUNCES OF WATER/JUICE TWICE DAILY   predniSONE 20 MG tablet Commonly known as: DELTASONE Take 2 tablets (40 mg total) by mouth daily with breakfast. For the next four days   Pulmicort 1 MG/2ML nebulizer solution Generic drug: budesonide USE 1 VIAL VIA NEBULIZER FOUR TIMES DAILY DURING RESPITORY INFECTIONS AND ASTHMA   Seasonique 0.15-0.03 &0.01 MG tablet Generic drug: Levonorgestrel-Ethinyl Estradiol Take 1 tablet by mouth daily.   senna 8.6 MG tablet Commonly known as: SENOKOT Take 1 tablet by mouth at bedtime.   terconazole 0.8 % vaginal cream Commonly known as: TERAZOL 3 Place 1 applicator vaginally at bedtime. Apply nightly for three  nights.   traZODone 50 MG tablet Commonly known as: DESYREL TAKE 2 TABLETS(100 MG) BY MOUTH AT BEDTIME   Vitamin D3 1.25 MG (50000 UT) Caps Take 1 capsule by mouth once a week.    Past Medical History:  Diagnosis Date   Asthma    Autism    mom states no actual dx, but admits she has some symptoms   Bacteriuria    Chronic constipation    Cognitive developmental delay    Constipation    COVID-19 03/2020   Disruptive behavior disorder    Dyspnea    if she walks much   Family history of adverse reaction to anesthesia     "hives"-? if anesthesia did take antibiotic   Frequency-urgency syndrome    Gait disorder    ORGANIC PER NERUOLGIST NOTE (DR HICKLING)   Generalized convulsive epilepsy ([place]) NEUROLOGIST-  DR HICKLING   GERD (gastroesophageal reflux disease)    Gout    H/O sinus tachycardia    History of acute respiratory failure 02/14/2015   SECONDARY TO CAP   History of recurrent UTIs  Incontinent of feces    Interstitial cystitis    Intertrigo 11/29/2019   Moderate intellectual disability    OSA (obstructive sleep apnea)    no machine yet    Partial epilepsy with impairment of consciousness ([place])    FOLLOWED BY DR HICKLING   Pneumonia 2    4 times this year ( 2020)   PONV (postoperative nausea and vomiting)    Renal cyst 11/29/2019   Seizure ([place])    most recent sz " at the end of summer"-last sz years ago per mother   Urinary incontinence     Past Surgical History:  Procedure Laterality Date   CYSTOSCOPY N/A 06/03/2017   Procedure: CYSTOSCOPY WITH EXAM UNDER ANESTHESIA;  Surgeon: Festus Aloe, MD;  Location: [facility];  Service: Urology;  Laterality: N/A;   CYSTOSCOPY  2020   CYSTOSCOPY WITH HYDRODISTENSION AND BIOPSY  04/14/2012   Procedure: CYSTOSCOPY/BIOPSY/HYDRODISTENSION;  Surgeon: Hanley Ben, MD;  Location: [place];  Service: Urology;;  instillation of marcaine and pyridium   EUA/  PAP SMEAR/  BREAST EXAM   03-12-2016   dr Ihor Dow [facility]   EUA/ VAGINOSCOPY/ COLPOSCOPY FO THE HYMENAL RING  10-04-2009    dr Delsa Sale  [place] & MGMT  07/29/2019   IR RADIOLOGIST EVAL & MGMT  09/30/2019   IR SCLEROTHERAPY OF A FLUID COLLECTION  09/08/2019   RADIOLOGY WITH ANESTHESIA N/A 09/08/2019   Procedure: IR Sclerotherapy Treatment;  Surgeon: Radiologist, Medication, MD;  Location: [place];  Service: Radiology;  Laterality: N/A;   TONSILLECTOMY      Review of systems negative except as noted in HPI / PMHx or noted below:  Review of Systems  Constitutional: Negative.   HENT: Negative.    Eyes: Negative.   Respiratory: Negative.    Cardiovascular: Negative.   Gastrointestinal: Negative.   Genitourinary: Negative.   Musculoskeletal: Negative.   Skin: Negative.   Neurological: Negative.   Endo/Heme/Allergies: Negative.   Psychiatric/Behavioral: Negative.       Objective:   Vitals:   04/02/22 0854  BP: 100/62  Pulse: 99  Resp: 16  Temp: 98.3 F (36.8 C)  SpO2: 96%   Height: 4' 9.5" (146.1 cm)  Weight: 149 lb (67.6 kg) (Verbal)   Physical Exam Constitutional:      Appearance: She is not diaphoretic.  HENT:     Head: Normocephalic.     Right Ear: Tympanic membrane, ear canal and external ear normal.     Left Ear: Tympanic membrane, ear canal and external ear normal.     Nose: Mucosal edema (Slight erythema left septal wall) present. No rhinorrhea.     Mouth/Throat:     Pharynx: Uvula midline. No oropharyngeal exudate.  Eyes:     Conjunctiva/sclera: Conjunctivae normal.  Neck:     Thyroid: No thyromegaly.     Trachea: Trachea normal. No tracheal tenderness or tracheal deviation.  Cardiovascular:     Rate and Rhythm: Normal rate and regular rhythm.     Heart sounds: Normal heart sounds, S1 normal and S2 normal. No murmur heard. Pulmonary:     Effort: No respiratory distress.     Breath sounds: Normal breath sounds. No stridor. No wheezing or rales.   Lymphadenopathy:     Head:     Right side of head: No tonsillar adenopathy.     Left side of head: No tonsillar adenopathy.     Cervical: No cervical adenopathy.  Skin:  Findings: No erythema or rash.     Nails: There is no clubbing.  Neurological:     Mental Status: She is alert.     Diagnostics: none  Assessment and Plan:   1. Asthma, severe persistent, well-controlled   2. Other allergic rhinitis   3. Gastroesophageal reflux disease, unspecified whether esophagitis present    1. Continue a combination of the following:   A. budesonide 1 mg nebulized twice a day  B. Perforomist nebulized twice a day  C. Nasal saline / gel 2 times per day  D. montelukast 10 mg daily  E. cetirizine 10 mg daily  2. Continue to Treat reflux with a combination of the following:   A. Famotidine 40mg  twice a day  B. No caffeine or chocolate consumption   3. "Action plan" for asthma flare up:   A. increase budesonide to 4 times a day  B. Albuterol or DUONEB nebulization every 4-6 hours if needed  4. If needed:   A. Cetirizine 10 mg - 1 tablet 1 time per day  5. Return to clinic 6 months or earlier if problem.   Liley appears to be doing pretty good at this point in time after having pretty aggressive therapy including 2 antibiotics and a systemic steroid recently for a respiratory tract infection.  Given what is going on within the community lately I suspect that that respiratory tract infection was probably viral in etiology.  But she is improved significantly and there is no need for any additional evaluation or treatment for this issue at this point.  She will continue to use preventative anti-inflammatory agents for her airway as noted above and also continue to address the issue with reflux as noted above.  Assuming she does well with this plan I will see her back in this clinic in 6 months or earlier if there is a problem.  , MD Allergy / Immunology [facility]

## 2022-04-03 ENCOUNTER — Encounter (HOSPITAL_BASED_OUTPATIENT_CLINIC_OR_DEPARTMENT_OTHER): Payer: Self-pay

## 2022-04-03 ENCOUNTER — Encounter: Payer: Self-pay | Admitting: Allergy and Immunology

## 2022-04-03 ENCOUNTER — Ambulatory Visit (HOSPITAL_BASED_OUTPATIENT_CLINIC_OR_DEPARTMENT_OTHER): Admit: 2022-04-03 | Payer: Medicare Other | Admitting: Obstetrics and Gynecology

## 2022-04-03 SURGERY — EXAM UNDER ANESTHESIA
Anesthesia: Choice

## 2022-04-04 ENCOUNTER — Other Ambulatory Visit (INDEPENDENT_AMBULATORY_CARE_PROVIDER_SITE_OTHER): Payer: Self-pay | Admitting: Family

## 2022-04-04 DIAGNOSIS — G4700 Insomnia, unspecified: Secondary | ICD-10-CM

## 2022-04-09 DIAGNOSIS — N390 Urinary tract infection, site not specified: Secondary | ICD-10-CM | POA: Diagnosis not present

## 2022-04-10 DIAGNOSIS — J4520 Mild intermittent asthma, uncomplicated: Secondary | ICD-10-CM | POA: Diagnosis not present

## 2022-04-11 NOTE — Telephone Encounter (Signed)
Right now all the Tuesday mornings are booked. I will be happy to contact Mom if someone cancels. TG

## 2022-04-25 NOTE — Progress Notes (Addendum)
I spoke with Delana Meyer Pala's mother, Ms A. janari, avis is not at home, she said to call her in am.  I asked Ms. Yaklin to call the pharmacy in am and update patient's medication.    I spoke with Forde Radon this afternoon, she is away form home and she did not speak to pharmacy- "the number was wrong."  [place] December reports that Melissie has not complained of chest pain or shortness of breath.  [name] denies having any s/s of Covid in her household, also denies any known exposure to Covid.   Shir's PCP is with [facility] [address], neurologist is Peter Minium, NP.  Dr. Carmelina Peal is patient's allergist. Bambie was seen by a cardiologist, Dr. Curt Bears  February 26 , 2017.  Abigael had been having a fast heart rate and shortness of breath. Patient had a ECHO. Patient was to follow up, I do see where patient had some phone calls, where Covid was discussed.  04/29/22 Jacqualyn Posey, Jeralyn's mother called to ask if she has to talk to the pharmacy, "they are on the other line," [name] said.  I told [name] that she needs to speak to the pharmacy or bring a list with current mediations, dose and when to take the medication with her.

## 2022-04-26 ENCOUNTER — Encounter (HOSPITAL_COMMUNITY): Payer: Self-pay | Admitting: Obstetrics and Gynecology

## 2022-04-26 ENCOUNTER — Other Ambulatory Visit: Payer: Self-pay

## 2022-04-29 NOTE — Anesthesia Preprocedure Evaluation (Signed)
Anesthesia Evaluation  Patient identified by MRN, date of birth, ID band Patient awake    Reviewed: Allergy & Precautions, NPO status , Patient's Chart, lab work & pertinent test results  History of Anesthesia Complications (+) PONV and history of anesthetic complications  Airway Mallampati: II  TM Distance: >3 FB Neck ROM: Full    Dental no notable dental hx. (+) Teeth Intact, Dental Advisory Given   Pulmonary asthma , sleep apnea (non compliant with CPAP)    Pulmonary exam normal breath sounds clear to auscultation       Cardiovascular negative cardio ROS Normal cardiovascular exam Rhythm:Regular Rate:Normal     Neuro/Psych Seizures -, Well Controlled,   negative psych ROS   GI/Hepatic Neg liver ROS,GERD  ,,  Endo/Other  negative endocrine ROS    Renal/GU negative Renal ROS  negative genitourinary   Musculoskeletal negative musculoskeletal ROS (+)    Abdominal   Peds  Hematology negative hematology ROS (+)   Anesthesia Other Findings autism  Reproductive/Obstetrics                             Anesthesia Physical Anesthesia Plan  ASA: 3  Anesthesia Plan: MAC   Post-op Pain Management: Ofirmev IV (intra-op)*   Induction: Intravenous  PONV Risk Score and Plan: 3 and Midazolam, Treatment may vary due to age or medical condition and Ondansetron  Airway Management Planned: Natural Airway and Simple Face Mask  Additional Equipment:   Intra-op Plan:   Post-operative Plan: Extubation in OR  Informed Consent: I have reviewed the patients History and Physical, chart, labs and discussed the procedure including the risks, benefits and alternatives for the proposed anesthesia with the patient or authorized representative who has indicated his/her understanding and acceptance.     Dental advisory given  Plan Discussed with: CRNA  Anesthesia Plan Comments:        Anesthesia  Quick Evaluation

## 2022-04-29 NOTE — H&P (Signed)
Lori Miles is an 27 y.o. female with autism here for scheduled exam under anesthesia and cervical cancer screening. Patient is accompanied by her mother who is her legal representative and historian. She reports that her menses are well controlled with continuous COC.She is without any current complaints    Menstrual History: No LMP recorded. (Menstrual status: Oral contraceptives).    Past Medical History:  Diagnosis Date   Autism disorder    Bacteriuria    Chronic constipation    Cognitive developmental delay    Constipation    COVID-19 03/2020   Disruptive behavior disorder    Dyspnea    if she walks much   Family history of adverse reaction to anesthesia     "hives"-? if anesthesia did take antibiotic   Frequency-urgency syndrome    Gait disorder    ORGANIC PER NERUOLGIST NOTE (DR HICKLING)   Generalized convulsive epilepsy ([place]) NEUROLOGIST-  DR HICKLING   GERD (gastroesophageal reflux disease)    Gout    H/O sinus tachycardia    History of acute respiratory failure 02/14/2015   SECONDARY TO CAP   History of recurrent UTIs    Incontinent of feces    Interstitial cystitis    Intertrigo 11/29/2019   Language development disorder    answers yes and no only   Moderate intellectual disability    OSA (obstructive sleep apnea)    no machine yet    Partial epilepsy with impairment of consciousness ([place])    FOLLOWED BY DR HICKLING   Pneumonia 2    4 times this year ( 2020)   PONV (postoperative nausea and vomiting)    Seizure ([place])    04/26/22 over 20 years   Urinary incontinence     Past Surgical History:  Procedure Laterality Date   CYSTOSCOPY N/A 06/03/2017   Procedure: CYSTOSCOPY WITH EXAM UNDER ANESTHESIA;  Surgeon: Festus Aloe, MD;  Location: [facility];  Service: Urology;  Laterality: N/A;   CYSTOSCOPY  2020   CYSTOSCOPY WITH HYDRODISTENSION AND BIOPSY  04/14/2012   Procedure: CYSTOSCOPY/BIOPSY/HYDRODISTENSION;  Surgeon: Hanley Ben, MD;  Location: [place];  Service: Urology;;  instillation of marcaine and pyridium   EUA/  PAP SMEAR/  BREAST EXAM  03-12-2016   dr Ihor Dow [facility]   EUA/ VAGINOSCOPY/ COLPOSCOPY FO THE HYMENAL RING  10-04-2009    dr Delsa Sale  [place] & MGMT  07/29/2019   IR RADIOLOGIST EVAL & MGMT  09/30/2019   IR SCLEROTHERAPY OF A FLUID COLLECTION  09/08/2019   RADIOLOGY WITH ANESTHESIA N/A 09/08/2019   Procedure: IR Sclerotherapy Treatment;  Surgeon: Radiologist, Medication, MD;  Location: [place];  Service: Radiology;  Laterality: N/A;   TONSILLECTOMY      Family History  Problem Relation Age of Onset   Seizures Mother    Seizures Sister        1 sister has   Pancreatic cancer Sister    Colon cancer Neg Hx    Colon polyps Neg Hx    Esophageal cancer Neg Hx    Rectal cancer Neg Hx    Stomach cancer Neg Hx     Social History:  reports that she has never smoked. She has never used smokeless tobacco. She reports that she does not drink alcohol and does not use drugs.  Allergies:  Allergies  Allergen Reactions   Abilify [Aripiprazole] Swelling and Palpitations   Seroquel [Quetiapine Fumarate] Palpitations   Amoxicillin-Pot Clavulanate Hives and  Other (See Comments)    Has patient had a PCN reaction causing immediate rash, facial/tongue/throat swelling, SOB or lightheadedness with hypotension: No Has patient had a PCN reaction causing severe rash involving mucus membranes or skin necrosis:    #  #  YES  #  #  Has patient had a PCN reaction that required hospitalization: No Has patient had a PCN reaction occurring within the last 10 years: No    Bactrim [Sulfamethoxazole-Trimethoprim] Other (See Comments)    Abdominal pain   Doxycycline Other (See Comments)    Stomach cramps   Macrobid [Nitrofurantoin Macrocrystal] Other (See Comments)    Sharp pain/ given with Tylenol 325 mg   Keppra [Levetiracetam] Other (See Comments)    Aggressive behavior      Medications Prior to Admission  Medication Sig Dispense Refill Last Dose   acetaminophen (TYLENOL) 325 MG tablet Take 2 tablets (650 mg total) by mouth every 6 (six) hours as needed for mild pain or moderate pain. 60 tablet 0 Past Month   benzonatate (TESSALON) 200 MG capsule Take 200 mg by mouth 3 (three) times daily.   04/30/2022   cetirizine (ZYRTEC) 10 MG tablet Take 1 tablet (10 mg total) by mouth 2 (two) times daily as needed for allergies (Can use an extra dose during flare ups). (Patient taking differently: Take 10 mg by mouth See admin instructions. Daily at 3pm can take additional 10 mg for asthma or allergies) 60 tablet 5    Cholecalciferol (VITAMIN D3) 1.25 MG (50000 UT) CAPS Take 1 capsule by mouth once a week.      DEPAKOTE 500 MG DR tablet TAKE 1 TABLET BY MOUTH IN THE MORNING AND 2 AT NIGHT (Patient taking differently: Take 500 mg by mouth See admin instructions. TAKE 500 mg  TABLET BY MOUTH IN THE MORNING AND 1000 mg AT NIGHT) 90 tablet 5 04/30/2022   famotidine (PEPCID) 20 MG tablet Take 1 tablet (20 mg total) by mouth 2 (two) times daily. TAKE 1 TABLET(20 MG) BY MOUTH EVERY 12 HOURS 180 tablet 3 04/30/2022   FEROSUL 325 (65 Fe) MG tablet Take 325 mg by mouth daily with breakfast.   04/30/2022   fluconazole (DIFLUCAN) 150 MG tablet Take 1 tablet (150 mg total) by mouth as needed (when on antibiotics). 30 tablet 3 Past Week   formoterol (PERFOROMIST) 20 MCG/2ML nebulizer solution Take 2 mLs (20 mcg total) by nebulization 2 (two) times daily. USE 1 VIAL VIA NEBULIZER TWICE DAILY 200 mL 3 04/30/2022   ipratropium-albuterol (DUONEB) 0.5-2.5 (3) MG/3ML SOLN Take 3 mLs by nebulization every 4 (four) hours as needed. (Patient taking differently: Take 3 mLs by nebulization every 4 (four) hours as needed (Asthma).) 360 mL 2 04/30/2022   melatonin 3 MG TABS tablet Take 2 tablets (30m) by mouth at bedtime. (Patient taking differently: Take 3 mg by mouth at bedtime.) 60 tablet 0 04/29/2022    montelukast (SINGULAIR) 10 MG tablet Take 1 tablet (10 mg total) by mouth at bedtime. TAKE 1 TABLET(10 MG) BY MOUTH AT BEDTIME (Patient taking differently: Take 10 mg by mouth See admin instructions. Takes at 3 pm) 30 tablet 5 04/29/2022   Nebulizer MISC Adult mask with tubing. Use as directed with nebulizer device. 4 each 12 04/30/2022   ondansetron (ZOFRAN ODT) 4 MG disintegrating tablet Take 2 tablets (8 mg total) by mouth every 8 (eight) hours as needed for nausea or vomiting. 20 tablet 0 Past Week   polyethylene glycol powder (GLYCOLAX/MIRALAX) 17 GM/SCOOP  powder DISSOLVE 1 CAPFUL(17 GRAMS) IN AT LEAST 8 OUNCES OF WATER/JUICE TWICE DAILY 1020 g 2 04/30/2022   PULMICORT 1 MG/2ML nebulizer solution Take 2 mLs (1 mg total) by nebulization in the morning, at noon, in the evening, and at bedtime. USE 1 VIAL VIA NEBULIZER FOUR TIMES DAILY DURING RESPITORY INFECTIONS AND ASTHMA 240 mL 2 04/30/2022   SEASONIQUE 0.15-0.03 &0.01 MG tablet Take 1 tablet by mouth daily. 91 tablet 5 04/30/2022   senna (SENOKOT) 8.6 MG tablet Take 1 tablet by mouth at bedtime.    04/29/2022   terconazole (TERAZOL 3) 0.8 % vaginal cream Place 1 applicator vaginally at bedtime. Apply nightly for three nights. (Patient taking differently: 1 applicator See admin instructions. In pull-up alternate every other week with A&D) 20 g 3 04/29/2022   traZODone (DESYREL) 50 MG tablet TAKE 2 TABLETS(100 MG) BY MOUTH AT BEDTIME 62 tablet 5 04/29/2022   Vitamins A & D (VITAMIN A & D) ointment Apply 1 Application topically See admin instructions. Put In pull up every other week alternate with Terconazole   Past Week   albuterol (PROVENTIL) (2.5 MG/3ML) 0.083% nebulizer solution Take 3 mLs (2.5 mg total) by nebulization every 4 (four) hours as needed for wheezing or shortness of breath. 150 mL 0 Unknown   albuterol (VENTOLIN HFA) 108 (90 Base) MCG/ACT inhaler Inhale 2 puffs into the lungs every 4 (four) hours as needed for wheezing or shortness of breath.  (Patient not taking: Reported on 04/02/2022) 54 g 0 Not Taking   hydrOXYzine (ATARAX/VISTARIL) 25 MG tablet Take 25 mg by mouth 3 (three) times daily as needed (pain).    More than a month   nystatin (MYCOSTATIN/NYSTOP) powder Apply 1 application topically 3 (three) times daily. (Patient taking differently: Apply 1 application  topically daily as needed (redness on legs).) 15 g 6 Unknown   nystatin powder Apply 1 Application topically 3 (three) times daily. 30 g 0 Unknown   Olopatadine HCl 0.2 % SOLN Can use one drop in each eye once daily if needed for red, itching, watery eyes. (Patient not taking: Reported on 04/29/2022) 2.5 mL 5 Unknown    Review of Systems See pertinent in HPI. All other systems reviewed and non contributory Blood pressure (!) 108/94, pulse (!) 118, temperature 98.2 F (36.8 C), temperature source Oral, resp. rate 18, height 4' 9.5" (1.461 m), weight 68 kg, SpO2 100 %. Physical Exam GENERAL: Well-developed, well-nourished female in no acute distress.  LUNGS: Clear to auscultation bilaterally.  HEART: Regular rate and rhythm. ABDOMEN: Soft, nontender, nondistended. No organomegaly. PELVIC: Deferred to OR EXTREMITIES: No cyanosis, clubbing, or edema, 2+ distal pulses.  No results found. However, due to the size of the patient record, not all encounters were searched. Please check Results Review for a complete set of results.  No results found.  Assessment/Plan: 27 yo with cognitive developmental delay here for exam under anesthesia - Process explained to patient and her mother - Consent signed - All questions were answered  Corine Solorio 04/30/2022, 11:45 AM

## 2022-04-30 ENCOUNTER — Ambulatory Visit (HOSPITAL_COMMUNITY): Payer: 59 | Admitting: Anesthesiology

## 2022-04-30 ENCOUNTER — Ambulatory Visit (HOSPITAL_BASED_OUTPATIENT_CLINIC_OR_DEPARTMENT_OTHER): Payer: 59 | Admitting: Anesthesiology

## 2022-04-30 ENCOUNTER — Other Ambulatory Visit (HOSPITAL_COMMUNITY): Admission: RE | Admit: 2022-04-30 | Payer: 59 | Source: Ambulatory Visit

## 2022-04-30 ENCOUNTER — Ambulatory Visit (HOSPITAL_COMMUNITY)
Admission: RE | Admit: 2022-04-30 | Discharge: 2022-04-30 | Disposition: A | Discharge status: Home / Self Care | Payer: 59 | Attending: Obstetrics and Gynecology | Admitting: Obstetrics and Gynecology

## 2022-04-30 ENCOUNTER — Encounter (HOSPITAL_COMMUNITY): Payer: Self-pay | Admitting: Obstetrics and Gynecology

## 2022-04-30 ENCOUNTER — Other Ambulatory Visit: Payer: Self-pay

## 2022-04-30 ENCOUNTER — Encounter (HOSPITAL_COMMUNITY)
Admission: RE | Disposition: A | Discharge status: Home / Self Care | Payer: Self-pay | Source: Home / Self Care | Attending: Obstetrics and Gynecology

## 2022-04-30 DIAGNOSIS — G4733 Obstructive sleep apnea (adult) (pediatric): Secondary | ICD-10-CM | POA: Insufficient documentation

## 2022-04-30 DIAGNOSIS — F840 Autistic disorder: Secondary | ICD-10-CM

## 2022-04-30 DIAGNOSIS — G4730 Sleep apnea, unspecified: Secondary | ICD-10-CM

## 2022-04-30 DIAGNOSIS — R569 Unspecified convulsions: Secondary | ICD-10-CM | POA: Insufficient documentation

## 2022-04-30 DIAGNOSIS — Z124 Encounter for screening for malignant neoplasm of cervix: Secondary | ICD-10-CM

## 2022-04-30 DIAGNOSIS — Z01419 Encounter for gynecological examination (general) (routine) without abnormal findings: Secondary | ICD-10-CM | POA: Diagnosis not present

## 2022-04-30 DIAGNOSIS — J45909 Unspecified asthma, uncomplicated: Secondary | ICD-10-CM | POA: Diagnosis not present

## 2022-04-30 DIAGNOSIS — K219 Gastro-esophageal reflux disease without esophagitis: Secondary | ICD-10-CM | POA: Diagnosis not present

## 2022-04-30 HISTORY — DX: Autistic disorder: F84.0

## 2022-04-30 HISTORY — DX: Developmental disorder of speech and language, unspecified: F80.9

## 2022-04-30 SURGERY — EXAM UNDER ANESTHESIA
Anesthesia: Monitor Anesthesia Care | Site: Vagina

## 2022-04-30 MED ORDER — CHLORHEXIDINE GLUCONATE 0.12 % MT SOLN
OROMUCOSAL | Status: AC
  Filled 2022-04-30: qty 15

## 2022-04-30 MED ORDER — FENTANYL CITRATE (PF) 100 MCG/2ML IJ SOLN: 25.0000 ug | INTRAMUSCULAR | Status: DC | PRN

## 2022-04-30 MED ORDER — ACETAMINOPHEN 10 MG/ML IV SOLN
1000.0000 mg | Freq: Once | INTRAVENOUS | Status: AC
  Administered 2022-04-30: 1000 mg via INTRAVENOUS
  Filled 2022-04-30: qty 100

## 2022-04-30 MED ORDER — PROPOFOL 10 MG/ML IV BOLUS
INTRAVENOUS | Status: DC | PRN
  Administered 2022-04-30: 20 mg via INTRAVENOUS
  Administered 2022-04-30 (×2): 30 mg via INTRAVENOUS

## 2022-04-30 MED ORDER — 0.9 % SODIUM CHLORIDE (POUR BTL) OPTIME: TOPICAL | Status: DC | PRN

## 2022-04-30 MED ORDER — PROPOFOL 10 MG/ML IV BOLUS
INTRAVENOUS | Status: AC
  Filled 2022-04-30: qty 20

## 2022-04-30 MED ORDER — LACTATED RINGERS IV SOLN: INTRAVENOUS | Status: DC

## 2022-04-30 MED ORDER — CHLORHEXIDINE GLUCONATE 0.12 % MT SOLN: 15.0000 mL | Freq: Once | OROMUCOSAL | Status: AC

## 2022-04-30 MED ORDER — LIDOCAINE 2% (20 MG/ML) 5 ML SYRINGE
INTRAMUSCULAR | Status: AC
  Filled 2022-04-30: qty 5

## 2022-04-30 MED ORDER — DEXMEDETOMIDINE HCL IN NACL 80 MCG/20ML IV SOLN
INTRAVENOUS | Status: AC
  Filled 2022-04-30: qty 20

## 2022-04-30 MED ORDER — ORAL CARE MOUTH RINSE
15.0000 mL | Freq: Once | OROMUCOSAL | Status: AC
  Administered 2022-04-30: 15 mL via OROMUCOSAL

## 2022-04-30 MED ORDER — MIDAZOLAM HCL 5 MG/5ML IJ SOLN
INTRAMUSCULAR | Status: DC | PRN
  Administered 2022-04-30: 2 mg via INTRAVENOUS

## 2022-04-30 MED ORDER — DEXMEDETOMIDINE HCL IN NACL 80 MCG/20ML IV SOLN
INTRAVENOUS | Status: DC | PRN
  Administered 2022-04-30 (×2): 4 ug via BUCCAL

## 2022-04-30 MED ORDER — MIDAZOLAM HCL 2 MG/2ML IJ SOLN
INTRAMUSCULAR | Status: AC
  Filled 2022-04-30: qty 2

## 2022-04-30 MED ORDER — POVIDONE-IODINE 10 % EX SWAB
2.0000 | Freq: Once | CUTANEOUS | Status: AC
  Administered 2022-04-30: 2 via TOPICAL

## 2022-04-30 SURGICAL SUPPLY — 4 items
GOWN STRL REUS W/ TWL LRG LVL3 (GOWN DISPOSABLE) IMPLANT
GOWN STRL REUS W/ TWL XL LVL3 (GOWN DISPOSABLE) IMPLANT
GOWN STRL REUS W/TWL LRG LVL3 (GOWN DISPOSABLE) ×2
GOWN STRL REUS W/TWL XL LVL3 (GOWN DISPOSABLE) ×1

## 2022-04-30 NOTE — Transfer of Care (Signed)
Immediate Anesthesia Transfer of Care Note  Patient: Lori Miles  Procedure(s) Performed: EXAM UNDER ANESTHESIA WITH PAP SMEAR (Vagina )  Patient Location: PACU  Anesthesia Type:MAC  Level of Consciousness: awake and patient cooperative  Airway & Oxygen Therapy: Patient Spontanous Breathing  Post-op Assessment: Report given to RN and Post -op Vital signs reviewed and stable  Post vital signs: Reviewed and stable  Last Vitals:  Vitals Value Taken Time  BP 116/75 04/30/22 1230  Temp 36.8 C 04/30/22 1227  Pulse 95 04/30/22 1232  Resp 14 04/30/22 1232  SpO2 97 % 04/30/22 1232  Vitals shown include unvalidated device data.  Last Pain:  Vitals:   04/30/22 1227  TempSrc:   PainSc: Asleep         Complications: No notable events documented.

## 2022-04-30 NOTE — Op Note (Signed)
Preoperative diagnosis: Unable to tolerate exam in the office, Need for Pap smear screening   Postoperative diagnosis: Same   Procedure: Exam under anesthesia, Pap smear   Surgeon: Mora Bellman, M.D.   Anesthesia: conscious sedation   Findings: 6 week size uterus, no adnexal mass   Estimated blood loss: Less than 25 cc   Specimen: cervical cytology   Reason for procedure: Patient is a 27 y.o. patient who is unable to tolerate exam in the office here for cervical cancer screening. Last pap smear 02/2019 was normal   Procedure: Patient taken to the OR where she was placed in dorsal lithotomy in [place]. A timeout was performed. A speculum was placed inside the vagina. The cervix was visualized and Pap smear obtained. The speculum was removed from the vagina. An exam revealed 6 week size uterus that was firm. There were no adnexal mass or tenderness. The patient was awakened and taken to recovery, stable.

## 2022-04-30 NOTE — Anesthesia Postprocedure Evaluation (Signed)
Anesthesia Post Note  Patient: Lori Miles  Procedure(s) Performed: EXAM UNDER ANESTHESIA WITH PAP SMEAR (Vagina )     Patient location during evaluation: PACU Anesthesia Type: MAC Level of consciousness: awake and alert Pain management: pain level controlled Vital Signs Assessment: post-procedure vital signs reviewed and stable Respiratory status: spontaneous breathing, nonlabored ventilation, respiratory function stable and patient connected to nasal cannula oxygen Cardiovascular status: stable and blood pressure returned to baseline Postop Assessment: no apparent nausea or vomiting Anesthetic complications: no  No notable events documented.  Last Vitals:  Vitals:   04/30/22 1230 04/30/22 1245  BP: 116/75 125/84  Pulse: 96 99  Resp: 15 11  Temp:  36.8 C  SpO2: 97% 97%    Last Pain:  Vitals:   04/30/22 1245  TempSrc:   PainSc: 0-No pain                 Elzia Hott L Anatasia Tino

## 2022-05-02 DIAGNOSIS — N390 Urinary tract infection, site not specified: Secondary | ICD-10-CM | POA: Diagnosis not present

## 2022-05-03 LAB — CYTOLOGY - PAP: Diagnosis: NEGATIVE

## 2022-05-06 ENCOUNTER — Other Ambulatory Visit: Payer: Self-pay | Admitting: Obstetrics and Gynecology

## 2022-05-06 MED ORDER — LEVONORGEST-ETH ESTRAD 91-DAY 0.15-0.03 &0.01 MG PO TABS: 1.0000 | ORAL_TABLET | Freq: Every day | ORAL | 4 refills | Status: DC

## 2022-05-09 ENCOUNTER — Encounter (INDEPENDENT_AMBULATORY_CARE_PROVIDER_SITE_OTHER): Payer: Self-pay | Admitting: Family

## 2022-05-09 ENCOUNTER — Ambulatory Visit (INDEPENDENT_AMBULATORY_CARE_PROVIDER_SITE_OTHER): Payer: 59 | Admitting: Family

## 2022-05-09 VITALS — BP 100/72 | HR 84 | Ht <= 58 in

## 2022-05-09 DIAGNOSIS — G40309 Generalized idiopathic epilepsy and epileptic syndromes, not intractable, without status epilepticus: Secondary | ICD-10-CM | POA: Diagnosis not present

## 2022-05-09 DIAGNOSIS — R2689 Other abnormalities of gait and mobility: Secondary | ICD-10-CM

## 2022-05-09 DIAGNOSIS — G478 Other sleep disorders: Secondary | ICD-10-CM

## 2022-05-09 DIAGNOSIS — R269 Unspecified abnormalities of gait and mobility: Secondary | ICD-10-CM | POA: Diagnosis not present

## 2022-05-09 DIAGNOSIS — G40209 Localization-related (focal) (partial) symptomatic epilepsy and epileptic syndromes with complex partial seizures, not intractable, without status epilepticus: Secondary | ICD-10-CM | POA: Diagnosis not present

## 2022-05-09 DIAGNOSIS — F819 Developmental disorder of scholastic skills, unspecified: Secondary | ICD-10-CM

## 2022-05-09 DIAGNOSIS — G4700 Insomnia, unspecified: Secondary | ICD-10-CM | POA: Diagnosis not present

## 2022-05-09 DIAGNOSIS — F840 Autistic disorder: Secondary | ICD-10-CM

## 2022-05-09 MED ORDER — DEPAKOTE 500 MG PO TBEC: DELAYED_RELEASE_TABLET | ORAL | 5 refills | Status: DC

## 2022-05-09 MED ORDER — TRAZODONE HCL 50 MG PO TABS: ORAL_TABLET | ORAL | 5 refills | Status: DC

## 2022-05-09 NOTE — Patient Instructions (Signed)
It was a pleasure to see you today!  Instructions for you until your next appointment are as follows: Continue Lori Miles's medications as prescribed Call if seizures occur I agree with restarting PT in March Please sign up for MyChart if you have not done so. Please plan to return for follow up in 6 months or sooner if needed.   Feel free to contact our office during normal business hours at [phone] with questions or concerns. If there is no answer or the call is outside business hours, please leave a message and our clinic staff will call you back within the next business day.  If you have an urgent concern, please stay on the line for our after-hours answering service and ask for the on-call neurologist.     I also encourage you to use MyChart to communicate with me more directly. If you have not yet signed up for MyChart within [facility], the front desk staff can help you. However, please note that this inbox is NOT monitored on nights or weekends, and response can take up to 2 business days.  Urgent matters should be discussed with the on-call pediatric neurologist.   At [facility], we are committed to providing exceptional care. You will receive a patient satisfaction survey through text or email regarding your visit today. Your opinion is important to me. Comments are appreciated.

## 2022-05-09 NOTE — Progress Notes (Signed)
Lori Miles   MRN:  [id number]  1995/09/22   Provider: Rockwell Germany NP-C Location of Care: [facility]  Visit type: Return visit  Last visit: 10/09/2021  Referral source: Scifres, [name], PA-C (Inactive) History from: Epic chart and patient's mother  Brief history:  Copied from previous record: Generalized and convulsive epilepsy, intellectual disability, autism spectrum disorder, disordered sleep and gait disorder. She also has asthma and history of frequent urinary tract infections. She is taking and tolerating Depakote for seizures and Trazodone for insomnia.   Today's concerns: Has remained seizure free since last visit Was receiving PT last year, will restart again March Has ongoing problem with recurrent [place] has been otherwise generally healthy since she was last seen. No health concerns today other than previously mentioned.  Review of systems: Please see HPI for neurologic and other pertinent review of systems. Otherwise all other systems were reviewed and were negative.  Problem List: Patient Active Problem List   Diagnosis Date Noted   Autism 04/30/2022   Well woman exam 04/30/2022   Moderate persistent asthma, uncomplicated 99991111   Vitamin D deficiency 10/09/2021   Encounter for long-term (current) use of high-risk medication 12/29/2020   Menorrhagia 12/16/2020   UTI symptoms 04/10/2020   Renal cyst 11/29/2019   Intertrigo 11/29/2019   Complex care coordination 05/18/2019   Chronic bladder pain 03/03/2019   Screening for cervical cancer 02/26/2019   Glucosuria 06/01/2018   Steroid-induced diabetes ([place]) 06/01/2018   Not well controlled moderate persistent asthma 04/29/2018   Gastroesophageal reflux disease 04/29/2018   Recurrent infections 04/29/2018   Dysuria 02/20/2018   Chronic rhinitis 04/03/2017   Recurrent falls 03/21/2017   Weakness 03/21/2017   Venous insufficiency 03/21/2017    OSA on CPAP 09/30/2016   Excessive daytime sleepiness 03/05/2016   Insomnia 10/04/2015   Acute recurrent pansinusitis 07/11/2015   Bronchitis 05/05/2015   Abnormality of gait 03/22/2015   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 if anesthesia did take antibiotic   Frequency-urgency syndrome    Gait disorder    ORGANIC PER NERUOLGIST NOTE (DR HICKLING)   Generalized convulsive epilepsy ([place]) NEUROLOGIST-  DR HICKLING   GERD (gastroesophageal reflux disease)    Gout    H/O sinus tachycardia    History of acute respiratory failure 02/14/2015   SECONDARY TO CAP  History of recurrent UTIs    Incontinent of feces    Interstitial cystitis    Intertrigo 11/29/2019   Language development disorder    answers yes and no only   Moderate intellectual disability    OSA  (obstructive sleep apnea)    no machine yet    Partial epilepsy with impairment of consciousness ([place])    FOLLOWED BY DR HICKLING   Pneumonia 2    4 times this year ( 2020)   PONV (postoperative nausea and vomiting)    Seizure ([place])    04/26/22 over 20 years   Urinary incontinence     Past medical history comments: See HPI Copied from previous record: EEG on September 14, 2011, was normal. She was able to be successfully weaned off Keppra. The patient has significant issues with aggression, which was thought to be related to [place], but has persisted once it was discontinued. The patient was treated with Depakote both for behavior and possible seizures. She had palpitations and severe constipation on Seroquel. She had marked weight gain on Abilify and Depakote. When generic Divalproex was tried she had breakthrough seizures.      Nocturnal polysomnogram on October 27, 2011, failed to show significant periodic limb movements, cyanosis, sleep apnea, or cardiac arrhythmia.  EEG on February 20, 2012, was a normal study with the patient awake.    EEG performed March 25, 2017 was normal   EEG performed May 21, 2018 was normal  Surgical history: Past Surgical History:  Procedure Laterality Date   CYSTOSCOPY N/A 06/03/2017   Procedure: CYSTOSCOPY WITH EXAM UNDER ANESTHESIA;  Surgeon: Festus Aloe, MD;  Location: [facility];  Service: Urology;  Laterality: N/A;   CYSTOSCOPY  2020   CYSTOSCOPY WITH HYDRODISTENSION AND BIOPSY  04/14/2012   Procedure: CYSTOSCOPY/BIOPSY/HYDRODISTENSION;  Surgeon: Hanley Ben, MD;  Location: [facility];  Service: Urology;;  instillation of marcaine and pyridium   EUA/  PAP SMEAR/  BREAST EXAM  03-12-2016   dr Ihor Dow [facility]   EUA/ VAGINOSCOPY/ COLPOSCOPY FO THE HYMENAL RING  10-04-2009    dr Delsa Sale  [place] & MGMT  07/29/2019   IR RADIOLOGIST EVAL & MGMT  09/30/2019   IR SCLEROTHERAPY OF A FLUID COLLECTION   09/08/2019   RADIOLOGY WITH ANESTHESIA N/A 09/08/2019   Procedure: IR Sclerotherapy Treatment;  Surgeon: Radiologist, Medication, MD;  Location: [facility];  Service: Radiology;  Laterality: N/A;   TONSILLECTOMY      Family history: family history includes Pancreatic cancer in her sister; Seizures in her mother and sister.   Social history: Social History   Socioeconomic History   Marital status: Single    Spouse name: Not on file   Number of children: Not on file   Years of education: Not on file   Highest education level: Not on file  Occupational History   Not on file  Tobacco Use   Smoking status: Never   Smokeless tobacco: Never  Vaping Use   Vaping Use: Never used  Substance and Sexual Activity   Alcohol use: No    Alcohol/week: 0.0 standard drinks of alcohol   Drug use: No   Sexual activity: Never    Birth control/protection: Pill  Other Topics Concern   Not on file  Social History Narrative   Lives with mom ([name] Vigo) and half-sister Urania Burdine) who is also mentally impaired.  Has CAP assistance, urinary incontinence, needs help with feeding,dressing, toileting   Graduated  from [employer].  She enjoys playing with cars, dancing, and dogs.   Social Determinants of Health   Financial Resource Strain: Not on file  Food Insecurity: Not on file  Transportation Needs: Not on file  Physical Activity: Not on file  Stress: Not on file  Social Connections: Not on file  Intimate Partner Violence: Not on file   Past/failed meds: Copied from previous record: Levetiracetam - aggressive behavior Divalproex - breakthrough seizures  Allergies: Allergies  Allergen Reactions   Abilify [Aripiprazole] Swelling and Palpitations   Seroquel [Quetiapine Fumarate] Palpitations   Amoxicillin-Pot Clavulanate Hives and Other (See Comments)    Has patient had a PCN reaction causing immediate rash, facial/tongue/throat swelling, SOB or lightheadedness with hypotension:  No Has patient had a PCN reaction causing severe rash involving mucus membranes or skin necrosis:    #  #  YES  #  #  Has patient had a PCN reaction that required hospitalization: No Has patient had a PCN reaction occurring within the last 10 years: No    Bactrim [Sulfamethoxazole-Trimethoprim] Other (See Comments)    Abdominal pain   Doxycycline Other (See Comments)    Stomach cramps   Macrobid [Nitrofurantoin Macrocrystal] Other (See Comments)    Sharp pain/ given with Tylenol 325 mg   Keppra [Levetiracetam] Other (See Comments)    Aggressive behavior     Immunizations: Immunization History  Administered Date(s) Administered   DTaP 02/06/1995, 04/30/1995, 07/02/1995, 01/14/1996, 10/10/1998   HIB (PRP-T) 02/06/1995, 04/30/1995, 07/02/1995, 01/14/1996   Hepatitis B, PED/ADOLESCENT [birth year], 02/06/1995, 01/14/1996   IPV 02/06/1995, 04/30/1995, 07/02/1995, 01/14/1996, 10/10/1998   MMR 01/14/1996, 10/10/1998   Pneumococcal Polysaccharide-23 08/06/2016, 03/07/2020   Tdap 03/03/2017   Varicella 01/14/1996    Diagnostics/Screenings: Copied from previous record: 12/15/19 rEEG - This EEG is normal during awake and asleep states. Please note that normal EEG does not exclude epilepsy, clinical correlation is indicated.  Teressa Lower, MD   03/27/17 - CT head - normal  Physical Exam: BP 100/72 (BP Location: Right Arm, Patient Position: Sitting, Cuff Size: Normal)   Pulse 84   Ht 4' 9.5" (1.461 m)   BMI 31.90 kg/m   General: well developed, well nourished young woman, seated in exam room, in no evident distress Head: normocephalic and atraumatic. Oropharynx benign. No dysmorphic features. Neck: supple Cardiovascular: regular rate and rhythm, no murmurs. Respiratory: clear to auscultation bilaterally Abdomen: bowel sounds present all four quadrants, abdomen soft, non-tender, non-distended. Musculoskeletal: no skeletal deformities or obvious scoliosis.  Skin: no rashes or  neurocutaneous lesions  Neurologic Exam Mental Status: awake and fully alert. Limited language. Variable eye contact. Smiles responsively. Played with a baby doll during the visit. Resistant to stop play for examination. Needs frequent redirection. Cranial Nerves: fundoscopic exam - red reflex present.  Unable to fully visualize fundus.  Pupils equal briskly reactive to light.  Turns to localize faces and objects in the periphery. Turns to localize sounds in the periphery. Facial movements are symmetric  Motor: normal functional bulk, tone and strength Sensory: withdrawal x 4 Coordination: unable to adequately assess due to patient's inability to participate in examination. No dysmetria when reaching for objects. Gait and Station: able to stand with assistance. Able to take steps but has poor balance and needs support.  Reflexes: diminished and symmetric. Toes neutral. No clonus   Impression: Generalized convulsive epilepsy ([place]) - Plan: DEPAKOTE 500 MG DR tablet  Partial epilepsy with impairment of consciousness ([place]) - Plan: DEPAKOTE 500 MG DR tablet  Insomnia, unspecified type - Plan: traZODone (DESYREL) 50 MG tablet  Cognitive developmental delay  Obesity, morbid ([place])  Autism spectrum disorder  Abnormality of gait  Sleep arousal disorder - Plan: traZODone (DESYREL) 50 MG tablet  Poor balance   Recommendations for plan of care: The patient's previous Epic records were reviewed. No recent diagnostic studies to be reviewed with the patient.  Plan until next visit: Continue medications as prescribed  Restart PT when insurance allows Call if seizure soccur Return in about 6 months (around 11/07/2022).  The medication list was reviewed and reconciled. No changes were made in the prescribed medications today. A complete medication list was provided to the patient.  Allergies as of 05/09/2022       Reactions   Abilify [aripiprazole] Swelling, Palpitations   Seroquel [quetiapine  Fumarate] Palpitations   Amoxicillin-pot Clavulanate Hives, Other (See Comments)   Has patient had a PCN reaction causing immediate rash, facial/tongue/throat swelling, SOB or lightheadedness with hypotension: No Has patient had a PCN reaction causing severe rash involving mucus membranes or skin necrosis:    #  #  YES  #  #  Has patient had a PCN reaction that required hospitalization: No Has patient had a PCN reaction occurring within the last 10 years: No   Bactrim [sulfamethoxazole-trimethoprim] Other (See Comments)   Abdominal pain   Doxycycline Other (See Comments)   Stomach cramps   Macrobid [nitrofurantoin Macrocrystal] Other (See Comments)   Sharp pain/ given with Tylenol 325 mg   Keppra [levetiracetam] Other (See Comments)   Aggressive behavior         Medication List        Accurate as of May 09, 2022 11:59 PM. If you have any questions, ask your nurse or doctor.          STOP taking these medications    cephALEXin 500 MG capsule Commonly known as: KEFLEX Stopped by: Rockwell Germany, NP       TAKE these medications    acetaminophen 325 MG tablet Commonly known as: TYLENOL Take 2 tablets (650 mg total) by mouth every 6 (six) hours as needed for mild pain or moderate pain.   albuterol 108 (90 Base) MCG/ACT inhaler Commonly known as: VENTOLIN HFA Inhale 2 puffs into the lungs every 4 (four) hours as needed for wheezing or shortness of breath.   albuterol (2.5 MG/3ML) 0.083% nebulizer solution Commonly known as: PROVENTIL Take 3 mLs (2.5 mg total) by nebulization every 4 (four) hours as needed for wheezing or shortness of breath.   amoxicillin-clavulanate 500-125 MG tablet Commonly known as: AUGMENTIN Take 1 tablet by mouth 3 (three) times daily.   benzonatate 200 MG capsule Commonly known as: TESSALON Take 200 mg by mouth 3 (three) times daily.   cetirizine 10 MG tablet Commonly known as: ZYRTEC Take 1 tablet (10 mg total) by mouth 2 (two)  times daily as needed for allergies (Can use an extra dose during flare ups). What changed:  when to take this additional instructions   Depakote 500 MG DR tablet Generic drug: divalproex TAKE 1 TABLET BY MOUTH IN THE MORNING AND 2 AT NIGHT What changed:  how much to take how to take this when to take this additional instructions   famotidine 20 MG tablet Commonly known as: PEPCID Take 1 tablet (20 mg total) by mouth 2 (two) times daily. TAKE 1 TABLET(20 MG) BY MOUTH EVERY 12 HOURS   FeroSul 325 (65 FE) MG tablet Generic drug: ferrous sulfate Take  325 mg by mouth daily with breakfast.   fluconazole 150 MG tablet Commonly known as: DIFLUCAN Take 1 tablet (150 mg total) by mouth as needed (when on antibiotics).   formoterol 20 MCG/2ML nebulizer solution Commonly known as: Perforomist Take 2 mLs (20 mcg total) by nebulization 2 (two) times daily. USE 1 VIAL VIA NEBULIZER TWICE DAILY   hydrOXYzine 25 MG tablet Commonly known as: ATARAX Take 25 mg by mouth 3 (three) times daily as needed (pain).   ipratropium-albuterol 0.5-2.5 (3) MG/3ML Soln Commonly known as: DUONEB Take 3 mLs by nebulization every 4 (four) hours as needed. What changed: reasons to take this   melatonin 3 MG Tabs tablet Take 2 tablets ('6mg'$ ) by mouth at bedtime. What changed:  how much to take how to take this when to take this additional instructions   montelukast 10 MG tablet Commonly known as: SINGULAIR Take 1 tablet (10 mg total) by mouth at bedtime. TAKE 1 TABLET(10 MG) BY MOUTH AT BEDTIME What changed:  when to take this additional instructions   Nebulizer Misc Adult mask with tubing. Use as directed with nebulizer device.   nystatin powder Commonly known as: MYCOSTATIN/NYSTOP Apply 1 application topically 3 (three) times daily. What changed:  when to take this reasons to take this   nystatin powder Commonly known as: nystatin Apply 1 Application topically 3 (three) times  daily. What changed: Another medication with the same name was changed. Make sure you understand how and when to take each.   ondansetron 4 MG disintegrating tablet Commonly known as: Zofran ODT Take 2 tablets (8 mg total) by mouth every 8 (eight) hours as needed for nausea or vomiting.   polyethylene glycol powder 17 GM/SCOOP powder Commonly known as: GLYCOLAX/MIRALAX DISSOLVE 1 CAPFUL(17 GRAMS) IN AT LEAST 8 OUNCES OF WATER/JUICE TWICE DAILY   Pulmicort 1 MG/2ML nebulizer solution Generic drug: budesonide Take 2 mLs (1 mg total) by nebulization in the morning, at noon, in the evening, and at bedtime. USE 1 VIAL VIA NEBULIZER FOUR TIMES DAILY DURING RESPITORY INFECTIONS AND ASTHMA   Seasonique 0.15-0.03 &0.01 MG tablet Generic drug: Levonorgestrel-Ethinyl Estradiol Take 1 tablet by mouth daily.   Levonorgestrel-Ethinyl Estradiol 0.15-0.03 &0.01 MG tablet Commonly known as: AMETHIA Take 1 tablet by mouth daily.   senna 8.6 MG tablet Commonly known as: SENOKOT Take 1 tablet by mouth at bedtime.   terconazole 0.8 % vaginal cream Commonly known as: TERAZOL 3 Place 1 applicator vaginally at bedtime. Apply nightly for three nights. What changed:  how to take this when to take this additional instructions   traZODone 50 MG tablet Commonly known as: DESYREL TAKE 2 TABLETS(100 MG) BY MOUTH AT BEDTIME   vitamin A & D ointment Apply 1 Application topically See admin instructions. Put In pull up every other week alternate with Terconazole   Vitamin D3 1.25 MG (50000 UT) capsule Generic drug: Cholecalciferol Take 1 capsule by mouth once a week.      Total time spent with the patient was 25 minutes, of which 50% or more was spent in counseling and coordination of care.  Rockwell Germany NP-C [facility] [id number] [place], [place] [address] Ph. [phone] Fax [phone]

## 2022-05-12 ENCOUNTER — Encounter (INDEPENDENT_AMBULATORY_CARE_PROVIDER_SITE_OTHER): Payer: Self-pay | Admitting: Family

## 2022-05-12 DIAGNOSIS — R2689 Other abnormalities of gait and mobility: Secondary | ICD-10-CM | POA: Insufficient documentation

## 2022-05-15 DIAGNOSIS — J4520 Mild intermittent asthma, uncomplicated: Secondary | ICD-10-CM | POA: Diagnosis not present

## 2022-05-20 DIAGNOSIS — N390 Urinary tract infection, site not specified: Secondary | ICD-10-CM | POA: Diagnosis not present

## 2022-05-29 ENCOUNTER — Other Ambulatory Visit: Payer: Self-pay | Admitting: Obstetrics and Gynecology

## 2022-05-30 ENCOUNTER — Other Ambulatory Visit (INDEPENDENT_AMBULATORY_CARE_PROVIDER_SITE_OTHER): Payer: Self-pay | Admitting: Family

## 2022-05-30 DIAGNOSIS — M205X9 Other deformities of toe(s) (acquired), unspecified foot: Secondary | ICD-10-CM

## 2022-05-30 DIAGNOSIS — R531 Weakness: Secondary | ICD-10-CM

## 2022-05-30 DIAGNOSIS — R296 Repeated falls: Secondary | ICD-10-CM

## 2022-05-30 DIAGNOSIS — F819 Developmental disorder of scholastic skills, unspecified: Secondary | ICD-10-CM

## 2022-05-30 DIAGNOSIS — R269 Unspecified abnormalities of gait and mobility: Secondary | ICD-10-CM

## 2022-05-30 DIAGNOSIS — F840 Autistic disorder: Secondary | ICD-10-CM

## 2022-06-03 ENCOUNTER — Other Ambulatory Visit: Payer: Self-pay | Admitting: Obstetrics and Gynecology

## 2022-06-03 MED ORDER — TERCONAZOLE 0.8 % VA CREA: TOPICAL_CREAM | VAGINAL | 6 refills | Status: DC

## 2022-06-04 DIAGNOSIS — N390 Urinary tract infection, site not specified: Secondary | ICD-10-CM | POA: Diagnosis not present

## 2022-06-04 DIAGNOSIS — R3982 Chronic bladder pain: Secondary | ICD-10-CM | POA: Diagnosis not present

## 2022-06-17 DIAGNOSIS — J4520 Mild intermittent asthma, uncomplicated: Secondary | ICD-10-CM | POA: Diagnosis not present

## 2022-06-24 DIAGNOSIS — N390 Urinary tract infection, site not specified: Secondary | ICD-10-CM | POA: Diagnosis not present

## 2022-07-11 ENCOUNTER — Encounter: Payer: Self-pay | Admitting: Physical Therapy

## 2022-07-11 ENCOUNTER — Ambulatory Visit: Payer: 59 | Attending: Family | Admitting: Physical Therapy

## 2022-07-11 DIAGNOSIS — R531 Weakness: Secondary | ICD-10-CM | POA: Diagnosis not present

## 2022-07-11 DIAGNOSIS — F819 Developmental disorder of scholastic skills, unspecified: Secondary | ICD-10-CM | POA: Diagnosis not present

## 2022-07-11 DIAGNOSIS — M6281 Muscle weakness (generalized): Secondary | ICD-10-CM | POA: Diagnosis not present

## 2022-07-11 DIAGNOSIS — R269 Unspecified abnormalities of gait and mobility: Secondary | ICD-10-CM | POA: Insufficient documentation

## 2022-07-11 DIAGNOSIS — R296 Repeated falls: Secondary | ICD-10-CM | POA: Diagnosis not present

## 2022-07-11 DIAGNOSIS — F840 Autistic disorder: Secondary | ICD-10-CM | POA: Insufficient documentation

## 2022-07-11 DIAGNOSIS — R2689 Other abnormalities of gait and mobility: Secondary | ICD-10-CM | POA: Diagnosis not present

## 2022-07-11 DIAGNOSIS — M205X9 Other deformities of toe(s) (acquired), unspecified foot: Secondary | ICD-10-CM | POA: Insufficient documentation

## 2022-07-11 DIAGNOSIS — R2681 Unsteadiness on feet: Secondary | ICD-10-CM | POA: Diagnosis not present

## 2022-07-11 NOTE — Therapy (Signed)
OUTPATIENT PHYSICAL THERAPY NEURO EVALUATION   Patient Name: Lori Miles MRN: [id number] DOB:01-11-1996, 27 y.o., female Today's Date: 07/11/2022   PCP: Karlyne Greenspan REFERRING PROVIDER: Elveria Rising, NP    PT End of Session - 07/11/22 1700     Visit Number 1    Number of Visits 7   eval + 6 sessions   Date for PT Re-Evaluation 09/06/22   pushed out due to scheduling   Authorization Type [facility] Medicare    Authorization Time Period 07-11-22 - 10-09-22    PT Start Time 0800    PT Stop Time 0845    PT Time Calculation (min) 45 min    Activity Tolerance Patient tolerated treatment well    Behavior During Therapy [facility] for tasks assessed/performed              Past Medical History:  Diagnosis Date   Autism disorder    Bacteriuria    Chronic constipation    Cognitive developmental delay    Constipation    COVID-19 03/2020   Disruptive behavior disorder    Dyspnea    if she walks much   Family history of adverse reaction to anesthesia     "hives"-? if anesthesia did take antibiotic   Frequency-urgency syndrome    Gait disorder    ORGANIC PER NERUOLGIST NOTE (DR HICKLING)   Generalized convulsive epilepsy NEUROLOGIST-  DR HICKLING   GERD (gastroesophageal reflux disease)    Gout    H/O sinus tachycardia    History of acute respiratory failure 02/14/2015   SECONDARY TO CAP   History of recurrent UTIs    Incontinent of feces    Interstitial cystitis    Intertrigo 11/29/2019   Language development disorder    answers yes and no only   Moderate intellectual disability    OSA (obstructive sleep apnea)    no machine yet    Partial epilepsy with impairment of consciousness    FOLLOWED BY DR HICKLING   Pneumonia 2    4 times this year ( 2020)   PONV (postoperative nausea and vomiting)    Seizure    04/26/22 over 20 years   Urinary incontinence    Past Surgical History:  Procedure Laterality Date   CYSTOSCOPY N/A 06/03/2017   Procedure: CYSTOSCOPY  WITH EXAM UNDER ANESTHESIA;  Surgeon: Jerilee Field, MD;  Location: [facility];  Service: Urology;  Laterality: N/A;   CYSTOSCOPY  2020   CYSTOSCOPY WITH HYDRODISTENSION AND BIOPSY  04/14/2012   Procedure: CYSTOSCOPY/BIOPSY/HYDRODISTENSION;  Surgeon: Lindaann Slough, MD;  Location: [facility];  Service: Urology;;  instillation of marcaine and pyridium   EUA/  PAP SMEAR/  BREAST EXAM  03-12-2016   dr Erin Fulling [facility]   EUA/ VAGINOSCOPY/ COLPOSCOPY FO THE HYMENAL RING  10-04-2009    dr Tamela Oddi  [facility]   IR RADIOLOGIST EVAL & MGMT  07/29/2019   IR RADIOLOGIST EVAL & MGMT  09/30/2019   IR SCLEROTHERAPY OF A FLUID COLLECTION  09/08/2019   RADIOLOGY WITH ANESTHESIA N/A 09/08/2019   Procedure: IR Sclerotherapy Treatment;  Surgeon: Radiologist, Medication, MD;  Location: [facility];  Service: Radiology;  Laterality: N/A;   TONSILLECTOMY     Patient Active Problem List   Diagnosis Date Noted   Poor balance 05/12/2022   Autism 04/30/2022   Well woman exam 04/30/2022   Moderate persistent asthma, uncomplicated 10/09/2021   Vitamin D deficiency 10/09/2021   Encounter for long-term (current) use of high-risk medication 12/29/2020  Menorrhagia 12/16/2020   UTI symptoms 04/10/2020   Renal cyst 11/29/2019   Intertrigo 11/29/2019   Complex care coordination 05/18/2019   Chronic bladder pain 03/03/2019   Screening for cervical cancer 02/26/2019   Glucosuria 06/01/2018   Steroid-induced diabetes 06/01/2018   Not well controlled moderate persistent asthma 04/29/2018   Gastroesophageal reflux disease 04/29/2018   Recurrent infections 04/29/2018   Dysuria 02/20/2018   Chronic rhinitis 04/03/2017   Recurrent falls 03/21/2017   Weakness 03/21/2017   Venous insufficiency 03/21/2017   OSA on CPAP 09/30/2016   Excessive daytime sleepiness 03/05/2016   Insomnia 10/04/2015   Acute recurrent pansinusitis 07/11/2015   Bronchitis 05/05/2015   Abnormality of gait 03/22/2015    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   Cough variant asthma vs UACS/ vcd  03/15/2015   Pneumonia 02/14/2015   Autism spectrum disorder 02/14/2015   Nausea with vomiting 02/14/2015   Obesity, morbid 12/13/2014   Mental retardation, 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   Acanthosis nigricans 11/14/2014   Toeing-in 08/29/2014   Generalized convulsive epilepsy 09/28/2012   Partial epilepsy with impairment of consciousness 09/28/2012   Cognitive developmental delay 09/28/2012   Recurrent urinary tract infection 08/01/2011   COPD with asthma 08/01/2011   Chronic constipation 08/01/2011   Contraception 08/01/2011   Encopresis with constipation and overflow incontinence 02/15/2011   Functional urinary incontinence 02/02/2010   Other convulsions 09/05/2009    ONSET DATE: Referral date 09-05-21  REFERRING DIAG:  Diagnosis F81.9 (ICD-10-CM) - Cognitive developmental delay M20.5X9 (ICD-10-CM) - In-toeing, unspecified laterality E66.01 (ICD-10-CM) - Obesity, morbid F84.0 (ICD-10-CM) - Autism spectrum disorder R26.9 (ICD-10-CM) - Abnormality of gait R29.6 (ICD-10-CM) - Recurrent falls R53.1 (ICD-10-CM) - Weakness  THERAPY DIAG:  Other abnormalities of gait and mobility  Muscle weakness (generalized)  Unsteadiness on feet  Rationale for Evaluation and Treatment Rehabilitation  SUBJECTIVE:                                                                                                                                                                                              SUBJECTIVE STATEMENT: [name] states pt is having more pain in her feet and swelling in her feet; mother reports she had a seizure 3 days ago - had shaking and reports she drops things: using wheelchair and walker (rollator)  depending on how she is doing that particular day;  pt using no device at today's eval;  Pt accompanied by:   mother, [name]  PERTINENT HISTORY: Autism spectrum disorder, h/o generalized convulsive epilepsy, steroid induced diabetes, h/o seizures, moderate  intellectual disability  PAIN:  Are you having pain? Yes -  pt rates pain 10/10 but unsure if pt able to cognitively understand rating scale and rate accurately -points to her Rt knee and left foot ; mother states she has swelling in both legs  PRECAUTIONS: None  WEIGHT BEARING RESTRICTIONS No  FALLS: Has patient fallen in last 6 months? No  LIVING ENVIRONMENT: Lives with: lives with their family Lives in: House/apartment Stairs: No Has following equipment at home: Environmental consultant - 4 wheeled, Wheelchair (manual), and Tour manager  PLOF: Independent with household mobility with device, Needs assistance with ADLs, and Needs assistance with homemaking  Needs assistance with community ambulation (supervision needed due to decreased cognition)  PATIENT GOALS increase activity to increase strength; improve balance and endurance  OBJECTIVE:   DIAGNOSTIC FINDINGS: N/A  COGNITION: Overall cognitive status: Impaired - developmental delay   SENSATION: WFL  COORDINATION: WFL's bil. LE's   POSTURE: rounded shoulders and forward head  LOWER EXTREMITY ROM:   WFL's bil. LE's   LOWER EXTREMITY MMT:  accurate assessment is questionable due to decreased cognition and decreased effort exhibited during testing  MMT Right Eval Left Eval  Hip flexion 3+ 3+  Hip extension    Hip abduction    Hip adduction    Hip internal rotation    Hip external rotation    Knee flexion 3+ 3+  Knee extension 4 3+  Ankle dorsiflexion 4 4-  Ankle plantarflexion    Ankle inversion    Ankle eversion    (Blank rows = not tested)  BED MOBILITY:  Independent  TRANSFERS: Assistive device utilized: None  Sit to stand: SBA Stand to sit: SBA   STAIRS:  Level of Assistance:  SBA  Stair Negotiation Technique: Step to Pattern Alternating Pattern  with Bilateral  Rails  Number of Stairs: 4   Height of Stairs: 6  Comments: pt ascended steps using step over step sequence and descended steps using step by step  GAIT: Gait pattern: step through pattern; LLE internally rotated with increased supination noted in Lt foot in stance Distance walked: 350' in 3" walk test Assistive device utilized: None Level of assistance: SBA Comments: see above Gait velocity:  13.62 secs = 2.41 ft/sec without device;  16.53 secs on 1st trial = 1.98 ft/sec without device  FUNCTIONAL TESTs:   5 times sit to stand: 16.22 secs from high/low mat table without UE support Timed up and go (TUG): 15.12 secs without device  Pt unable to perform SLS on either RLE or LLE without UE support Pt able to pick up object off floor and return to standing without LOB Pt stood with feet together on floor for 30 secs with SBA     PATIENT EDUCATION: Education details: eval results with POC of 6 weeks Person educated: Parent Education method: Explanation Education comprehension: verbalized understanding   HOME EXERCISE PROGRAM: To be issued    GOALS: Goals reviewed with patient? Yes  SHORT TERM GOALS: Target date: 08/01/2022 (3 weeks)  Amb. 465' (4 laps in gym) nonstop without device with SBA to demo increased endurance. Baseline: 230' with SBA Goal status: INITIAL  2.  Improve TUG score to </= 13.5 secs without device to decrease fall risk.  Baseline: 15.12 secs Goal status: INITIAL  3.  Perform HEP for balance and strengthening with mother's assist.  Baseline:  Goal status: INITIAL   LONG TERM GOALS: Target date: 08/22/2022 - 6 weeks  Pt will amb. 600' without device on  flat, even surface without LOB with SBA. Baseline: 230'  Goal status: INITIAL  2.  Perform at least 10" on recumbent bike nonstop for improved endurance/activity.  Baseline:  Goal status: INITIAL  3.  Improve TUG score to </= 11.5 secs without device to decrease fall risk.  Baseline: 15.12 secs  without device Baseline:  Goal status: INITIAL  4.  Pt will perform sit to stand 5 times without use of UE support from mat table in </= 13 secs to demo improved LE strength. Baseline:   Goal status: INITIAL  5.  Provide updated HEP as appropriate to be performed with mother's assist. Baseline:  Goal status: INITIAL  ASSESSMENT:  CLINICAL IMPRESSION: Patient is a 27 y.o. female  who was seen today for physical therapy evaluation and treatment for gait abnormality.  PMH includes autism, developmental delay and h/o seizures/epilepsy.  Pt exhibits abnormal gait pattern with LLE internally rotated in stance, decreased push off of LLE in stance due to weakness in Lt plantarflexors.  Pt also demonstrates decreased balance wit inability to perform SLS on either leg and TUG score 15.12 secs, indicative of fall risk.  Pt also demonstrates decreased activity tolerance/ decreased endurance as she amb. 350' in 3" prior to requesting a seated rest period.  Pt will benefit from PT to address gait & balance deficits and LE weakness.      OBJECTIVE IMPAIRMENTS Abnormal gait, decreased activity tolerance, decreased balance, decreased cognition, decreased endurance, and decreased strength.   ACTIVITY LIMITATIONS carrying, lifting, bending, squatting, stairs, and locomotion level  PARTICIPATION LIMITATIONS: meal prep, cleaning, laundry, and shopping  PERSONAL FACTORS Behavior pattern, Fitness, Past/current experiences, Time since onset of injury/illness/exacerbation, and 1 comorbidity: Behavior patterns/cognition  are also affecting patient's functional outcome.   REHAB POTENTIAL: Good for goals established  CLINICAL DECISION MAKING: Stable/uncomplicated  EVALUATION COMPLEXITY: Low  PLAN: PT FREQUENCY: 1x/week  PT DURATION: 6 weeks  PLANNED INTERVENTIONS: Therapeutic exercises, Therapeutic activity, Neuromuscular re-education, Balance training, Gait training, Patient/Family education, Self Care,  and Stair training  PLAN FOR NEXT SESSION: LE strengthening and balance training   Aloria Looper, Donavan Burnet, PT 07/11/2022, 5:03 PM

## 2022-07-16 DIAGNOSIS — F5101 Primary insomnia: Secondary | ICD-10-CM | POA: Diagnosis not present

## 2022-07-16 DIAGNOSIS — Z79899 Other long term (current) drug therapy: Secondary | ICD-10-CM | POA: Diagnosis not present

## 2022-07-16 DIAGNOSIS — Z993 Dependence on wheelchair: Secondary | ICD-10-CM | POA: Diagnosis not present

## 2022-07-16 DIAGNOSIS — D500 Iron deficiency anemia secondary to blood loss (chronic): Secondary | ICD-10-CM | POA: Diagnosis not present

## 2022-07-16 DIAGNOSIS — J4550 Severe persistent asthma, uncomplicated: Secondary | ICD-10-CM | POA: Diagnosis not present

## 2022-07-16 DIAGNOSIS — N390 Urinary tract infection, site not specified: Secondary | ICD-10-CM | POA: Diagnosis not present

## 2022-07-16 DIAGNOSIS — J4520 Mild intermittent asthma, uncomplicated: Secondary | ICD-10-CM | POA: Diagnosis not present

## 2022-07-16 DIAGNOSIS — Z9181 History of falling: Secondary | ICD-10-CM | POA: Diagnosis not present

## 2022-07-16 DIAGNOSIS — E559 Vitamin D deficiency, unspecified: Secondary | ICD-10-CM | POA: Diagnosis not present

## 2022-07-22 ENCOUNTER — Ambulatory Visit (INDEPENDENT_AMBULATORY_CARE_PROVIDER_SITE_OTHER): Payer: 59 | Admitting: Internal Medicine

## 2022-07-22 ENCOUNTER — Encounter: Payer: Self-pay | Admitting: Internal Medicine

## 2022-07-22 ENCOUNTER — Telehealth: Payer: Self-pay

## 2022-07-22 VITALS — BP 114/68 | HR 104 | Ht <= 58 in | Wt 156.0 lb

## 2022-07-22 DIAGNOSIS — J4540 Moderate persistent asthma, uncomplicated: Secondary | ICD-10-CM

## 2022-07-22 DIAGNOSIS — J301 Allergic rhinitis due to pollen: Secondary | ICD-10-CM | POA: Diagnosis not present

## 2022-07-22 DIAGNOSIS — K219 Gastro-esophageal reflux disease without esophagitis: Secondary | ICD-10-CM

## 2022-07-22 DIAGNOSIS — G4733 Obstructive sleep apnea (adult) (pediatric): Secondary | ICD-10-CM | POA: Diagnosis not present

## 2022-07-22 NOTE — Progress Notes (Signed)
Lori Miles    [id number]    1996/02/14  Primary Care Physician:Scifres, Lori Miles (Inactive) Date of Appointment: 07/22/2022 Established Patient Visit  Chief complaint:   Chief Complaint  Patient presents with   Follow-up    Pt is here to switch providers due to office location. Pt has had some asthma flares since last visit. Has some occasional wheezing.     HPI: Lori Miles is a 27 y.o. woman with autism, nonverbal and dyspnea and cough suspectd to be asthma with rhinitis and reflux.   Interval Updates: Lori Miles has severe developmental delay, autism and seizure disorder. She has significant limitations with her ADLs and is full assist with daily caregivers/nurses.  Former patient of Lori. Craige Miles. Last seen by TP about a year ago. Here to establish with me today.   She has childhood asthma.  She also has OSA on CPAP - mom hasn't been putting her on it.   Also sees Lori. Lucie Miles for reflux and rhinitis.  Frequent UTIs requiring ABX. Frequent flares for asthma.   Takes budesonide and formoterol nebs twice daily scheduled.   Takes duoneb prn. Feels albuterol doesn't work as well. Takes only as needed, not everyday.   Has frequent URIs requiring steroids.   Takes singulair,  Also takes ayr saline gel twice daily in the nose for dryness and irritation  I have reviewed the patient's family social and past medical history and updated as appropriate.   Past Medical History:  Diagnosis Date   Autism disorder    Bacteriuria    Chronic constipation    Cognitive developmental delay    Constipation    COVID-19 03/2020   Disruptive behavior disorder    Dyspnea    if she walks much   Family history of adverse reaction to anesthesia     "hives"-? if anesthesia did take antibiotic   Frequency-urgency syndrome    Gait disorder    ORGANIC PER NERUOLGIST NOTE (Lori Miles)   Generalized convulsive epilepsy (HCC) NEUROLOGIST-  Lori Miles   GERD  (gastroesophageal reflux disease)    Gout    H/O sinus tachycardia    History of acute respiratory failure 02/14/2015   SECONDARY TO CAP   History of recurrent UTIs    Incontinent of feces    Interstitial cystitis    Intertrigo 11/29/2019   Language development disorder    answers yes and no only   Moderate intellectual disability    OSA (obstructive sleep apnea)    no machine yet    Partial epilepsy with impairment of consciousness (HCC)    FOLLOWED BY Lori Miles   Pneumonia 2    4 times this year ( 2020)   PONV (postoperative nausea and vomiting)    Seizure (HCC)    04/26/22 over 20 years   Urinary incontinence     Past Surgical History:  Procedure Laterality Date   CYSTOSCOPY N/A 06/03/2017   Procedure: CYSTOSCOPY WITH EXAM UNDER ANESTHESIA;  Surgeon: Lori Field, MD;  Location: [facility];  Service: Urology;  Laterality: N/A;   CYSTOSCOPY  2020   CYSTOSCOPY WITH HYDRODISTENSION AND BIOPSY  04/14/2012   Procedure: CYSTOSCOPY/BIOPSY/HYDRODISTENSION;  Surgeon: Lori Slough, MD;  Location: [facility];  Service: Urology;;  instillation of marcaine and pyridium   EUA/  PAP SMEAR/  BREAST EXAM  03-12-2016   Lori Lori Miles [facility]   EUA/ VAGINOSCOPY/ COLPOSCOPY FO THE HYMENAL RING  10-04-2009  Lori Lori Miles  [facility]   IR RADIOLOGIST EVAL & MGMT  07/29/2019   IR RADIOLOGIST EVAL & MGMT  09/30/2019   IR SCLEROTHERAPY OF A FLUID COLLECTION  09/08/2019   RADIOLOGY WITH ANESTHESIA N/A 09/08/2019   Procedure: IR Sclerotherapy Treatment;  Surgeon: Radiologist, Medication, MD;  Location: [facility];  Service: Radiology;  Laterality: N/A;   TONSILLECTOMY      Family History  Problem Relation Age of Onset   Seizures Mother    Seizures Sister        1 sister has   Pancreatic cancer Sister    Colon cancer Neg Hx    Colon polyps Neg Hx    Esophageal cancer Neg Hx    Rectal cancer Neg Hx    Stomach cancer Neg Hx     Social History   Occupational  History   Not on file  Tobacco Use   Smoking status: Never   Smokeless tobacco: Never  Vaping Use   Vaping Use: Never used  Substance and Sexual Activity   Alcohol use: No    Alcohol/week: 0.0 standard drinks of alcohol   Drug use: No   Sexual activity: Never    Birth control/protection: Pill     Physical Exam: Blood pressure 114/68, pulse (!) 104, height 4' 9.5" (1.461 m), weight 156 lb (70.8 kg), SpO2 98 %.  Gen:      No acute distress ENT:  small septal ulceration is healing well, right nare more patent than left, no nasal polyps, mucus membranes moist Lungs:    No increased respiratory effort, symmetric chest wall excursion, clear to auscultation bilaterally, no wheezes or crackles CV:         Regular rate and rhythm; no murmurs, rubs, or gallops.  No pedal edema   Data Reviewed: Imaging: I have personally reviewed the chest xray June 2023  CT sinus Jan 2022 - normal CT sinus  PFTs:      No data to display         I have personally reviewed the patient's PFTs and spirometry July 2022 difficult to interpret.    Labs: Lab Results  Component Value Date   NA 138 11/14/2021   K 3.4 (L) 11/14/2021   CO2 24 11/14/2021   GLUCOSE 109 (H) 11/14/2021   BUN 16 11/14/2021   CREATININE 1.06 (H) 11/14/2021   CALCIUM 7.9 (L) 11/14/2021   GFRNONAA >60 11/14/2021   Lab Results  Component Value Date   WBC 17.8 (H) 11/14/2021   HGB 12.6 11/14/2021   HCT 38.2 11/14/2021   MCV 94.1 11/14/2021   PLT 282 11/14/2021    Immunization status: Immunization History  Administered Date(s) Administered   DTaP 02/06/1995, 04/30/1995, 07/02/1995, 01/14/1996, 10/10/1998   HIB (PRP-T) 02/06/1995, 04/30/1995, 07/02/1995, 01/14/1996   Hepatitis B, PED/ADOLESCENT Nov 27, 1995, 02/06/1995, 01/14/1996   IPV 02/06/1995, 04/30/1995, 07/02/1995, 01/14/1996, 10/10/1998   MMR 01/14/1996, 10/10/1998   Pneumococcal Polysaccharide-23 08/06/2016, 03/07/2020   Tdap 03/03/2017   Varicella  01/14/1996    External Records Personally Reviewed: allergy, pulmonary, urology  Assessment:  Moderate persistent asthma, well controlled Rhinitis, chronic, controlled with recent nasal septum ulceration GERD, controlled OSA, not yet on cpap.    Plan/Recommendations:  Continue the budesonide and aformoterol nebulizers daily.   Continue duoneb up to 4 times a day as needed. If the breathing gets worse, start taking the duoneb rescue treatments. If you're needing them every 4 hours call our office because you might need to be treated for an asthma  flare.  Start using CPAP therapy with boiled or distilled water. Follow up with sleep doctor at [facility] as we discussed.  Continue the acid reflux medication - pepcid  Continue singulair for allergies.   The area in the nose is healing well - keep using the saline ayr gel as needed for dryness and irritation.    Return to Care: Return in about 3 months (around 10/22/2022).   Durel Salts, MD Pulmonary and Critical Care Medicine [facility] Office:[phone]

## 2022-07-22 NOTE — Patient Instructions (Addendum)
Please schedule follow up scheduled with myself in 3 months.  If my schedule is not open yet, we will contact you with a reminder closer to that time. Please call [phone] if you haven't heard from [name] a month before.   Continue the budesonide and aformoterol nebulizers daily.   Continue duoneb up to 4 times a day as needed. If the breathing gets worse, start taking the duoneb rescue treatments. If you're needing them every 4 hours call our office because you might need to be treated for an asthma flare.   Start using CPAP therapy with boiled or distilled water. Follow up with sleep doctor at [facility] as we discussed.  Continue the acid reflux medication - pepcid  Continue singulair for allergies.   The area in the nose is healing well - keep using the saline ayr gel as needed for dryness and irritation.

## 2022-07-22 NOTE — Telephone Encounter (Signed)
Provider has Received/reviewed/completed/signed forms from [facility] - DOB verified.  Completed/Signed forms have been faxed back to [facility].

## 2022-08-05 ENCOUNTER — Ambulatory Visit: Payer: 59 | Attending: Family | Admitting: Physical Therapy

## 2022-08-05 DIAGNOSIS — M6281 Muscle weakness (generalized): Secondary | ICD-10-CM | POA: Diagnosis not present

## 2022-08-05 DIAGNOSIS — R2689 Other abnormalities of gait and mobility: Secondary | ICD-10-CM | POA: Insufficient documentation

## 2022-08-05 DIAGNOSIS — R2681 Unsteadiness on feet: Secondary | ICD-10-CM | POA: Insufficient documentation

## 2022-08-05 NOTE — Therapy (Signed)
OUTPATIENT PHYSICAL THERAPY NEURO TREATMENT NOTE   Patient Name: Lori Miles MRN: [id number] DOB:23-Dec-1995, 27 y.o., female Today's Date: 08/06/2022   PCP: Clementeen Graham, PA-C REFERRING PROVIDER: Elveria Rising, NP    PT End of Session - 08/06/22 1937     Visit Number 2    Number of Visits 7   eval + 6 sessions   Date for PT Re-Evaluation 09/06/22   pushed out due to scheduling   Authorization Type [facility] Medicare    Authorization Time Period 07-11-22 - 10-09-22    PT Start Time 0758    PT Stop Time 0844    PT Time Calculation (min) 46 min    Activity Tolerance Patient tolerated treatment well    Behavior During Therapy [facility] for tasks assessed/performed               Past Medical History:  Diagnosis Date   Autism disorder    Bacteriuria    Chronic constipation    Cognitive developmental delay    Constipation    COVID-19 03/2020   Disruptive behavior disorder    Dyspnea    if she walks much   Family history of adverse reaction to anesthesia     "hives"-? if anesthesia did take antibiotic   Frequency-urgency syndrome    Gait disorder    ORGANIC PER NERUOLGIST NOTE (DR HICKLING)   Generalized convulsive epilepsy (HCC) NEUROLOGIST-  DR HICKLING   GERD (gastroesophageal reflux disease)    Gout    H/O sinus tachycardia    History of acute respiratory failure 02/14/2015   SECONDARY TO CAP   History of recurrent UTIs    Incontinent of feces    Interstitial cystitis    Intertrigo 11/29/2019   Language development disorder    answers yes and no only   Moderate intellectual disability    OSA (obstructive sleep apnea)    no machine yet    Partial epilepsy with impairment of consciousness (HCC)    FOLLOWED BY DR HICKLING   Pneumonia 2    4 times this year ( 2020)   PONV (postoperative nausea and vomiting)    Seizure (HCC)    04/26/22 over 20 years   Urinary incontinence    Past Surgical History:  Procedure Laterality Date   CYSTOSCOPY N/A 06/03/2017    Procedure: CYSTOSCOPY WITH EXAM UNDER ANESTHESIA;  Surgeon: Jerilee Field, MD;  Location: [facility];  Service: Urology;  Laterality: N/A;   CYSTOSCOPY  2020   CYSTOSCOPY WITH HYDRODISTENSION AND BIOPSY  04/14/2012   Procedure: CYSTOSCOPY/BIOPSY/HYDRODISTENSION;  Surgeon: Lindaann Slough, MD;  Location:  [facility];  Service: Urology;;  instillation of marcaine and pyridium   EUA/  PAP SMEAR/  BREAST EXAM  03-12-2016   dr Erin Fulling [facility]   EUA/ VAGINOSCOPY/ COLPOSCOPY FO THE HYMENAL RING  10-04-2009    dr Tamela Oddi  [facility]   IR RADIOLOGIST EVAL & MGMT  07/29/2019   IR RADIOLOGIST EVAL & MGMT  09/30/2019   IR SCLEROTHERAPY OF A FLUID COLLECTION  09/08/2019   RADIOLOGY WITH ANESTHESIA N/A 09/08/2019   Procedure: IR Sclerotherapy Treatment;  Surgeon: Radiologist, Medication, MD;  Location: [facility];  Service: Radiology;  Laterality: N/A;   TONSILLECTOMY     Patient Active Problem List   Diagnosis Date Noted   Poor balance 05/12/2022   Autism 04/30/2022   Well woman exam 04/30/2022   Moderate persistent asthma, uncomplicated 10/09/2021   Vitamin D deficiency 10/09/2021   Encounter for long-term (current)  use of high-risk medication 12/29/2020   Menorrhagia 12/16/2020   UTI symptoms 04/10/2020   Renal cyst 11/29/2019   Intertrigo 11/29/2019   Complex care coordination 05/18/2019   Chronic bladder pain 03/03/2019   Screening for cervical cancer 02/26/2019   Glucosuria 06/01/2018   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   Not well controlled moderate persistent asthma 04/29/2018   Gastroesophageal reflux disease 04/29/2018   Recurrent infections 04/29/2018   Dysuria 02/20/2018   Chronic rhinitis 04/03/2017   Recurrent falls 03/21/2017   Weakness 03/21/2017   Venous insufficiency 03/21/2017   OSA on CPAP 09/30/2016   Excessive daytime sleepiness 03/05/2016   Insomnia 10/04/2015   Acute recurrent pansinusitis 07/11/2015   Bronchitis 05/05/2015    Abnormality of gait 03/22/2015   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   Acanthosis nigricans 11/14/2014   Toeing-in 08/29/2014   Generalized convulsive epilepsy (HCC) 09/28/2012   Partial epilepsy with impairment of consciousness (HCC) 09/28/2012   Cognitive developmental delay 09/28/2012   Recurrent urinary tract infection 08/01/2011   COPD with asthma 08/01/2011   Chronic constipation 08/01/2011   Contraception 08/01/2011   Encopresis with constipation and overflow incontinence 02/15/2011   Functional urinary incontinence 02/02/2010   Other convulsions 09/05/2009    ONSET DATE: Referral date 09-05-21  REFERRING DIAG:  Diagnosis F81.9 (ICD-10-CM) - Cognitive developmental delay M20.5X9 (ICD-10-CM) - In-toeing, unspecified laterality E66.01 (ICD-10-CM) - Obesity, morbid F84.0 (ICD-10-CM) - Autism spectrum disorder R26.9 (ICD-10-CM) - Abnormality of gait R29.6 (ICD-10-CM) - Recurrent falls R53.1 (ICD-10-CM) - Weakness  THERAPY DIAG:  Other abnormalities of gait and mobility  Muscle weakness (generalized)  Unsteadiness on feet  Rationale for Evaluation and Treatment Rehabilitation  SUBJECTIVE:                                                                                                                                                                                              SUBJECTIVE STATEMENT: [name] states pt has had "2 UTI's together and back to back" but is doing a little better now - is not shaking as bad as she was; mother reports no falls since eval 3 weeks ago Pt accompanied by:  mother, [name]  PERTINENT HISTORY: Autism spectrum disorder, h/o  generalized convulsive epilepsy, steroid induced diabetes, h/o seizures, moderate intellectual disability  PAIN:  Are you having  pain? Yes -  pt rates pain 10/10 but unsure if pt able to cognitively understand rating scale and rate accurately -points to her Rt knee and left foot ; mother states she has swelling in both legs  PRECAUTIONS: None  WEIGHT BEARING RESTRICTIONS No  FALLS: Has patient fallen in last 6 months? No  LIVING ENVIRONMENT: Lives with: lives with their family Lives in: House/apartment Stairs: No Has following equipment at home: Environmental consultant - 4 wheeled, Wheelchair (manual), and Tour manager  PLOF: Independent with household mobility with device, Needs assistance with ADLs, and Needs assistance with homemaking  Needs assistance with community ambulation (supervision needed due to decreased cognition)  PATIENT GOALS increase activity to increase strength; improve balance and endurance  OBJECTIVE:   STAIRS:  Level of Assistance:  SBA  Stair Negotiation Technique: Step to Pattern Alternating Pattern  with Bilateral Rails  Number of Stairs: 4 Steps x 2 reps = 8 steps total   Height of Stairs: 6  Comments: pt ascended steps using step over step sequence and descended steps using step by step  GAIT: Gait pattern: step through pattern; LLE internally rotated with increased supination noted in Lt foot in stance Distance walked: 260' on 1st rep:  260' x 1 on 2nd rep Assistive device utilized: None Level of assistance: SBA Comments: see above  NEURO RE-ED:  Standing on Airex - pt performed marching 10 reps each leg with min HHA for improved SLS on compliant surface  Rockerboard anterior/posteriorly inside // bars 10 reps with UE support on // bars  Cone taps to 3 cones with min HHA to improve SLS on each leg standing on blue mat for compliant surface training  Stepping stones (6) of varying height - pt stepped on each stone with 1 foot - with min HHA for improved  balance and SLS on each leg  THEREX: Sit to stand from mat without UE support 5 reps - feet on floor   Leg press 40# 3 sets 10 reps bil. LE's - seat at 13   SciFit level 3.0 x 5" with bil. UE's and LE's - goal to try to maintain at least 75 steps/min   PATIENT EDUCATION: Education details: eval results with POC of 6 weeks Person educated: Parent Education method: Explanation Education comprehension: verbalized understanding   HOME EXERCISE PROGRAM: To be issued    GOALS: Goals reviewed with patient? Yes  SHORT TERM GOALS: Target date: 08-01-22 (3 weeks)  Amb. 465' (4 laps in gym) nonstop without device with SBA to demo increased endurance. Baseline: 230' with SBA Goal status: INITIAL  2.  Improve TUG score to </= 13.5 secs without device to decrease fall risk.  Baseline: 15.12 secs Goal status: INITIAL  3.  Perform HEP for balance and strengthening with mother's assist.  Baseline:  Goal status: INITIAL   LONG TERM GOALS: Target date: 08-22-22 - 6 weeks  Pt will amb. 600' without device on flat, even surface without LOB with SBA. Baseline: 230'  Goal status: INITIAL  2.  Perform at least 10" on recumbent bike nonstop for improved endurance/activity.  Baseline:  Goal status: INITIAL  3.  Improve TUG score to </= 11.5 secs without device to decrease fall risk.  Baseline: 15.12 secs without device Baseline:  Goal status: INITIAL  4.  Pt will perform sit to stand 5 times without use of UE support from mat table in </= 13 secs to demo improved LE strength. Baseline:   Goal status: INITIAL  5.  Provide updated HEP as appropriate to be performed with mother's assist. Baseline:  Goal status: INITIAL  ASSESSMENT:  CLINICAL IMPRESSION: PT session focused on balance and strengthening exercises and endurance training with pt amb. 460' total distance (230' on each rep of gait training) prior to need for seated rest period.  Pt's LLE continues to internally rotate during  swing phase of gait with pelvic protraction noted.  Cont with POC.      OBJECTIVE IMPAIRMENTS Abnormal gait, decreased activity tolerance, decreased balance, decreased cognition, decreased endurance, and decreased strength.   ACTIVITY LIMITATIONS carrying, lifting, bending, squatting, stairs, and locomotion level  PARTICIPATION LIMITATIONS: meal prep, cleaning, laundry, and shopping  PERSONAL FACTORS Behavior pattern, Fitness, Past/current experiences, Time since onset of injury/illness/exacerbation, and 1 comorbidity: Behavior patterns/cognition  are also affecting patient's functional outcome.   REHAB POTENTIAL: Good for goals established  CLINICAL DECISION MAKING: Stable/uncomplicated  EVALUATION COMPLEXITY: Low  PLAN: PT FREQUENCY: 1x/week  PT DURATION: 6 weeks  PLANNED INTERVENTIONS: Therapeutic exercises, Therapeutic activity, Neuromuscular re-education, Balance training, Gait training, Patient/Family education, Self Care, and Stair training  PLAN FOR NEXT SESSION: LE strengthening and balance training   Bobbye Petti, Donavan Burnet, PT 08/06/2022, 7:59 PM

## 2022-08-06 ENCOUNTER — Encounter: Payer: Self-pay | Admitting: Physical Therapy

## 2022-08-13 ENCOUNTER — Ambulatory Visit: Payer: 59 | Admitting: Physical Therapy

## 2022-08-13 ENCOUNTER — Encounter: Payer: Self-pay | Admitting: Physical Therapy

## 2022-08-13 DIAGNOSIS — R2681 Unsteadiness on feet: Secondary | ICD-10-CM | POA: Diagnosis not present

## 2022-08-13 DIAGNOSIS — M6281 Muscle weakness (generalized): Secondary | ICD-10-CM

## 2022-08-13 DIAGNOSIS — R2689 Other abnormalities of gait and mobility: Secondary | ICD-10-CM | POA: Diagnosis not present

## 2022-08-13 NOTE — Therapy (Signed)
OUTPATIENT PHYSICAL THERAPY NEURO TREATMENT NOTE   Patient Name: Lori Miles MRN: [id number] DOB:January 26, 1996, 27 y.o., female Today's Date: 08/13/2022   PCP: Karlyne Greenspan REFERRING PROVIDER: Elveria Rising, NP    PT End of Session - 08/13/22 0857     Visit Number 3    Number of Visits 7   eval + 6 sessions   Date for PT Re-Evaluation 09/06/22   pushed out due to scheduling   Authorization Type [facility] Medicare    Authorization Time Period 07-11-22 - 10-09-22    PT Start Time 0757    PT Stop Time 0845    PT Time Calculation (min) 48 min    Activity Tolerance Patient tolerated treatment well    Behavior During Therapy [facility] for tasks assessed/performed                Past Medical History:  Diagnosis Date   Autism disorder    Bacteriuria    Chronic constipation    Cognitive developmental delay    Constipation    COVID-19 03/2020   Disruptive behavior disorder    Dyspnea    if she walks much   Family history of adverse reaction to anesthesia     "hives"-? if anesthesia did take antibiotic   Frequency-urgency syndrome    Gait disorder    ORGANIC PER NERUOLGIST NOTE (DR HICKLING)   Generalized convulsive epilepsy (HCC) NEUROLOGIST-  DR HICKLING   GERD (gastroesophageal reflux disease)    Gout    H/O sinus tachycardia    History of acute respiratory failure 02/14/2015   SECONDARY TO CAP   History of recurrent UTIs    Incontinent of feces    Interstitial cystitis    Intertrigo 11/29/2019   Language development disorder    answers yes and no only   Moderate intellectual disability    OSA (obstructive sleep apnea)    no machine yet    Partial epilepsy with impairment of consciousness (HCC)    FOLLOWED BY DR HICKLING   Pneumonia 2    4 times this year ( 2020)   PONV (postoperative nausea and vomiting)    Seizure (HCC)    04/26/22 over 20 years   Urinary incontinence    Past Surgical History:  Procedure Laterality Date   CYSTOSCOPY N/A 06/03/2017    Procedure: CYSTOSCOPY WITH EXAM UNDER ANESTHESIA;  Surgeon: Jerilee Field, MD;  Location: [facility];  Service: Urology;  Laterality: N/A;   CYSTOSCOPY  2020   CYSTOSCOPY WITH HYDRODISTENSION AND BIOPSY  04/14/2012   Procedure: CYSTOSCOPY/BIOPSY/HYDRODISTENSION;  Surgeon: Lindaann Slough, MD;  Location: [facility];  Service: Urology;;  instillation of marcaine and pyridium   EUA/  PAP SMEAR/  BREAST EXAM  03-12-2016   dr Erin Fulling [facility]   EUA/ VAGINOSCOPY/ COLPOSCOPY FO THE HYMENAL RING  10-04-2009    dr Tamela Oddi  [facility]   IR RADIOLOGIST EVAL & MGMT  07/29/2019   IR RADIOLOGIST EVAL & MGMT  09/30/2019   IR SCLEROTHERAPY OF A FLUID COLLECTION  09/08/2019   RADIOLOGY WITH ANESTHESIA N/A 09/08/2019   Procedure: IR Sclerotherapy Treatment;  Surgeon: Radiologist, Medication, MD;  Location: [facility];  Service: Radiology;  Laterality: N/A;   TONSILLECTOMY     Patient Active Problem List   Diagnosis Date Noted   Poor balance 05/12/2022   Autism 04/30/2022   Well woman exam 04/30/2022   Moderate persistent asthma, uncomplicated 10/09/2021   Vitamin D deficiency 10/09/2021   Encounter for long-term (  current) use of high-risk medication 12/29/2020   Menorrhagia 12/16/2020   UTI symptoms 04/10/2020   Renal cyst 11/29/2019   Intertrigo 11/29/2019   Complex care coordination 05/18/2019   Chronic bladder pain 03/03/2019   Screening for cervical cancer 02/26/2019   Glucosuria 06/01/2018   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   Not well controlled moderate persistent asthma 04/29/2018   Gastroesophageal reflux disease 04/29/2018   Recurrent infections 04/29/2018   Dysuria 02/20/2018   Chronic rhinitis 04/03/2017   Recurrent falls 03/21/2017   Weakness 03/21/2017   Venous insufficiency 03/21/2017   OSA on CPAP 09/30/2016   Excessive daytime sleepiness 03/05/2016   Insomnia 10/04/2015   Acute recurrent pansinusitis 07/11/2015   Bronchitis 05/05/2015    Abnormality of gait 03/22/2015   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   Acanthosis nigricans 11/14/2014   Toeing-in 08/29/2014   Generalized convulsive epilepsy (HCC) 09/28/2012   Partial epilepsy with impairment of consciousness (HCC) 09/28/2012   Cognitive developmental delay 09/28/2012   Recurrent urinary tract infection 08/01/2011   COPD with asthma 08/01/2011   Chronic constipation 08/01/2011   Contraception 08/01/2011   Encopresis with constipation and overflow incontinence 02/15/2011   Functional urinary incontinence 02/02/2010   Other convulsions 09/05/2009    ONSET DATE: Referral date 09-05-21  REFERRING DIAG:  Diagnosis F81.9 (ICD-10-CM) - Cognitive developmental delay M20.5X9 (ICD-10-CM) - In-toeing, unspecified laterality E66.01 (ICD-10-CM) - Obesity, morbid F84.0 (ICD-10-CM) - Autism spectrum disorder R26.9 (ICD-10-CM) - Abnormality of gait R29.6 (ICD-10-CM) - Recurrent falls R53.1 (ICD-10-CM) - Weakness  THERAPY DIAG:  Other abnormalities of gait and mobility  Muscle weakness (generalized)  Unsteadiness on feet  Rationale for Evaluation and Treatment Rehabilitation  SUBJECTIVE:                                                                                                                                                                                              SUBJECTIVE STATEMENT: [name] reports no changes or problems since previous session last week; mother states she thinks the UTI is still not cleared up Pt accompanied by:  mother, [name]  PERTINENT HISTORY: Autism spectrum disorder, h/o generalized convulsive epilepsy, steroid induced  diabetes, h/o seizures, moderate intellectual disability  PAIN:  Are you having pain? Yes -  pt rates pain 10/10 but unsure if pt able to  cognitively understand rating scale and rate accurately -points to her Rt knee and left foot ; mother states she has swelling in both legs  PRECAUTIONS: None  WEIGHT BEARING RESTRICTIONS No  FALLS: Has patient fallen in last 6 months? No  LIVING ENVIRONMENT: Lives with: lives with their family Lives in: House/apartment Stairs: No Has following equipment at home: Environmental consultant - 4 wheeled, Wheelchair (manual), and Tour manager  PLOF: Independent with household mobility with device, Needs assistance with ADLs, and Needs assistance with homemaking  Needs assistance with community ambulation (supervision needed due to decreased cognition)  PATIENT GOALS increase activity to increase strength; improve balance and endurance  OBJECTIVE:   STAIRS:  Level of Assistance:  SBA  Stair Negotiation Technique: Step to Pattern Alternating Pattern  with Bilateral Rails  Number of Stairs: 4 Steps x 2 reps = 8 steps total  (performed 2nd rep approx. 10" after 1st rep)  Height of Stairs: 6  Comments: pt ascended steps using step over step sequence and descended steps using step by step; pt reported LLE discomfort with descension with increased flexion of Lt knee noted - provided mod HHA for pt to descend last 2 steps on 1st rep; pt took a seated rest break and was able to continue with exercise; appeared to be behavioral rather than actual weakness or instability as pt able to resume without difficulty after seated rest break  GAIT: Gait pattern: step through pattern; LLE internally rotated with increased supination noted in Lt foot in stance Distance walked: 260' on 1st rep:  260' x 1 on 2nd rep Assistive device utilized: None Level of assistance: SBA Comments: see above  NEURO RE-ED:  Standing on Airex - pt performed marching 5 reps each leg with min HHA for  improved SLS on compliant surface  Rockerboard anterior/posteriorly inside // bars 10 reps with UE support on // bars  Obstacle course - Stepping stones (4) of varying height - pt stepped on each stone with 1 foot - with min HHA for improved balance and SLS on each leg, ladder on floor to facilitate increased step length using step over step sequence and 3 cones to facilitate SLS on each leg as pt performed combination of cone taps and tipping cone over and standing upright with min HHA  THEREX: Sit to stand from mat without UE support 5 reps - feet on Airex   Leg press 50# 3 sets 10 reps bil. LE's - seat at 13   SciFit level 2.5 x 5:30"  with bil. UE's and LE's - goal to try to maintain at least 75 steps/min   Standing bil. Hip flexion, abduction and extension with 3# weight - forward, back and side kicks 10 reps each leg  PATIENT EDUCATION: Education details: walking program recommended Person educated: Parent Education method: Explanation Education comprehension: verbalized understanding   HOME EXERCISE PROGRAM: To be issued    GOALS: Goals reviewed with patient? Yes  SHORT TERM GOALS: Target date: 08-01-22 (3 weeks)  Amb. 465' (4 laps in gym) nonstop without device with SBA to demo increased endurance. Baseline: 230' with SBA Goal status: In progress - 08-13-22  2.  Improve TUG score to </= 13.5 secs without device to decrease fall risk.  Baseline: 15.12 secs Goal status: In progress - 08-13-22  3.  Perform HEP for balance and strengthening with mother's assist.  Baseline:  Goal status: Goal met 08-13-22   LONG TERM GOALS: Target date: 08-22-22 - 6 weeks  Pt will amb. 600'  without device on flat, even surface without LOB with SBA. Baseline: 230'  Goal status: INITIAL  2.  Perform at least 10" on recumbent bike nonstop for improved endurance/activity.  Baseline:  Goal status: INITIAL  3.  Improve TUG score to </= 11.5 secs without device to decrease fall risk.   Baseline: 15.12 secs without device Baseline:  Goal status: INITIAL  4.  Pt will perform sit to stand 5 times without use of UE support from mat table in </= 13 secs to demo improved LE strength. Baseline:   Goal status: INITIAL  5.  Provide updated HEP as appropriate to be performed with mother's assist. Baseline:  Goal status: INITIAL  ASSESSMENT:  CLINICAL IMPRESSION: Pt met STG#3 as pt is independent with HEP with mother's assist (same exercises as issued in previous OP PT admission):   PT session focused on strengthening bil. LE's, balance exercises to improve SLS on each leg and ambbulation for endurance training.  Pt is progressing well towards goals.   Cont with POC.      OBJECTIVE IMPAIRMENTS Abnormal gait, decreased activity tolerance, decreased balance, decreased cognition, decreased endurance, and decreased strength.   ACTIVITY LIMITATIONS carrying, lifting, bending, squatting, stairs, and locomotion level  PARTICIPATION LIMITATIONS: meal prep, cleaning, laundry, and shopping  PERSONAL FACTORS Behavior pattern, Fitness, Past/current experiences, Time since onset of injury/illness/exacerbation, and 1 comorbidity: Behavior patterns/cognition  are also affecting patient's functional outcome.   REHAB POTENTIAL: Good for goals established  CLINICAL DECISION MAKING: Stable/uncomplicated  EVALUATION COMPLEXITY: Low  PLAN: PT FREQUENCY: 1x/week  PT DURATION: 6 weeks  PLANNED INTERVENTIONS: Therapeutic exercises, Therapeutic activity, Neuromuscular re-education, Balance training, Gait training, Patient/Family education, Self Care, and Stair training  PLAN FOR NEXT SESSION: LE strengthening and balance training   Cassie Henkels, Donavan Burnet, PT 08/13/2022, 9:01 AM

## 2022-08-19 DIAGNOSIS — J4520 Mild intermittent asthma, uncomplicated: Secondary | ICD-10-CM | POA: Diagnosis not present

## 2022-08-20 DIAGNOSIS — L700 Acne vulgaris: Secondary | ICD-10-CM | POA: Diagnosis not present

## 2022-08-20 DIAGNOSIS — L249 Irritant contact dermatitis, unspecified cause: Secondary | ICD-10-CM | POA: Diagnosis not present

## 2022-08-26 ENCOUNTER — Ambulatory Visit: Payer: 59 | Attending: Family | Admitting: Physical Therapy

## 2022-08-26 DIAGNOSIS — R2689 Other abnormalities of gait and mobility: Secondary | ICD-10-CM | POA: Diagnosis not present

## 2022-08-26 DIAGNOSIS — M6281 Muscle weakness (generalized): Secondary | ICD-10-CM | POA: Insufficient documentation

## 2022-08-26 DIAGNOSIS — R2681 Unsteadiness on feet: Secondary | ICD-10-CM | POA: Insufficient documentation

## 2022-08-26 NOTE — Therapy (Signed)
OUTPATIENT PHYSICAL THERAPY NEURO TREATMENT NOTE   Patient Name: Lori Miles MRN: [id number] DOB:1995-07-23, 27 y.o., female Today's Date: 08/27/2022   PCP: Clementeen Graham, PA-C REFERRING PROVIDER: Elveria Rising, NP    PT End of Session - 08/27/22 0917     Visit Number 4    Number of Visits 7   eval + 6 sessions   Date for PT Re-Evaluation 09/06/22   pushed out due to scheduling   Authorization Type [facility] Medicare    Authorization Time Period 07-11-22 - 10-09-22    PT Start Time 0758    PT Stop Time 0845    PT Time Calculation (min) 47 min    Activity Tolerance Patient tolerated treatment well    Behavior During Therapy [facility] for tasks assessed/performed                 Past Medical History:  Diagnosis Date   Autism disorder    Bacteriuria    Chronic constipation    Cognitive developmental delay    Constipation    COVID-19 03/2020   Disruptive behavior disorder    Dyspnea    if she walks much   Family history of adverse reaction to anesthesia     "hives"-? if anesthesia did take antibiotic   Frequency-urgency syndrome    Gait disorder    ORGANIC PER NERUOLGIST NOTE (DR HICKLING)   Generalized convulsive epilepsy (HCC) NEUROLOGIST-  DR HICKLING   GERD (gastroesophageal reflux disease)    Gout    H/O sinus tachycardia    History of acute respiratory failure 02/14/2015   SECONDARY TO CAP   History of recurrent UTIs    Incontinent of feces    Interstitial cystitis    Intertrigo 11/29/2019   Language development disorder    answers yes and no only   Moderate intellectual disability    OSA (obstructive sleep apnea)    no machine yet    Partial epilepsy with impairment of consciousness (HCC)    FOLLOWED BY DR HICKLING   Pneumonia 2    4 times this year ( 2020)   PONV (postoperative nausea and vomiting)    Seizure (HCC)    04/26/22 over 20 years   Urinary incontinence    Past Surgical History:  Procedure Laterality Date   CYSTOSCOPY N/A  06/03/2017   Procedure: CYSTOSCOPY WITH EXAM UNDER ANESTHESIA;  Surgeon: Jerilee Field, MD;  Location: [facility];  Service: Urology;  Laterality: N/A;   CYSTOSCOPY  2020   CYSTOSCOPY WITH HYDRODISTENSION AND BIOPSY  04/14/2012   Procedure: CYSTOSCOPY/BIOPSY/HYDRODISTENSION;  Surgeon: Lindaann Slough, MD;  Location: [facility];  Service: Urology;;  instillation of marcaine and pyridium   EUA/  PAP SMEAR/  BREAST EXAM  03-12-2016   dr Erin Fulling [facility]   EUA/ VAGINOSCOPY/ COLPOSCOPY FO THE HYMENAL RING  10-04-2009    dr Tamela Oddi  [facility]   IR RADIOLOGIST EVAL & MGMT  07/29/2019   IR RADIOLOGIST EVAL & MGMT  09/30/2019   IR SCLEROTHERAPY OF A FLUID COLLECTION  09/08/2019   RADIOLOGY WITH ANESTHESIA N/A 09/08/2019   Procedure: IR Sclerotherapy Treatment;  Surgeon: Radiologist, Medication, MD;  Location: [facility];  Service: Radiology;  Laterality: N/A;   TONSILLECTOMY     Patient Active Problem List   Diagnosis Date Noted   Poor balance 05/12/2022   Autism 04/30/2022   Well woman exam 04/30/2022   Moderate persistent asthma, uncomplicated 10/09/2021   Vitamin D deficiency 10/09/2021   Encounter for  long-term (current) use of high-risk medication 12/29/2020   Menorrhagia 12/16/2020   UTI symptoms 04/10/2020   Renal cyst 11/29/2019   Intertrigo 11/29/2019   Complex care coordination 05/18/2019   Chronic bladder pain 03/03/2019   Screening for cervical cancer 02/26/2019   Glucosuria 06/01/2018   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   Not well controlled moderate persistent asthma 04/29/2018   Gastroesophageal reflux disease 04/29/2018   Recurrent infections 04/29/2018   Dysuria 02/20/2018   Chronic rhinitis 04/03/2017   Recurrent falls 03/21/2017   Weakness 03/21/2017   Venous insufficiency 03/21/2017   OSA on CPAP 09/30/2016   Excessive daytime sleepiness 03/05/2016   Insomnia 10/04/2015   Acute recurrent pansinusitis 07/11/2015   Bronchitis  05/05/2015   Abnormality of gait 03/22/2015   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   Acanthosis nigricans 11/14/2014   Toeing-in 08/29/2014   Generalized convulsive epilepsy (HCC) 09/28/2012   Partial epilepsy with impairment of consciousness (HCC) 09/28/2012   Cognitive developmental delay 09/28/2012   Recurrent urinary tract infection 08/01/2011   COPD with asthma 08/01/2011   Chronic constipation 08/01/2011   Contraception 08/01/2011   Encopresis with constipation and overflow incontinence 02/15/2011   Functional urinary incontinence 02/02/2010   Other convulsions 09/05/2009    ONSET DATE: Referral date 09-05-21  REFERRING DIAG:  Diagnosis F81.9 (ICD-10-CM) - Cognitive developmental delay M20.5X9 (ICD-10-CM) - In-toeing, unspecified laterality E66.01 (ICD-10-CM) - Obesity, morbid F84.0 (ICD-10-CM) - Autism spectrum disorder R26.9 (ICD-10-CM) - Abnormality of gait R29.6 (ICD-10-CM) - Recurrent falls R53.1 (ICD-10-CM) - Weakness  THERAPY DIAG:  Other abnormalities of gait and mobility  Muscle weakness (generalized)  Unsteadiness on feet  Rationale for Evaluation and Treatment Rehabilitation  SUBJECTIVE:                                                                                                                                                                                              SUBJECTIVE STATEMENT: [name] reports pt still has a UTI - states she needs a different medication because the Keflex isn't working for her- is going to call MD and request another antibiotic.  Mother reports no falls or changes  Pt accompanied by:  mother,  [name]  PERTINENT HISTORY: Autism spectrum disorder, h/o generalized convulsive epilepsy, steroid induced diabetes, h/o seizures, moderate intellectual disability  PAIN:  Are you having pain? Yes -  pt rates pain 10/10 but unsure if pt able to cognitively understand rating scale and rate accurately -points to her Rt knee and left foot ; mother states she has swelling in both legs  PRECAUTIONS: None  WEIGHT BEARING RESTRICTIONS No  FALLS: Has patient fallen in last 6 months? No  LIVING ENVIRONMENT: Lives with: lives with their family Lives in: House/apartment Stairs: No Has following equipment at home: Environmental consultant - 4 wheeled, Wheelchair (manual), and Tour manager  PLOF: Independent with household mobility with device, Needs assistance with ADLs, and Needs assistance with homemaking  Needs assistance with community ambulation (supervision needed due to decreased cognition)  PATIENT GOALS increase activity to increase strength; improve balance and endurance   OBJECTIVE:   STAIRS:  Level of Assistance:  SBA  Stair Negotiation Technique: Step to Pattern Alternating Pattern  with Bilateral Rails  Number of Stairs: 4 Steps x 3 reps =  12 steps total (3 reps consecutive)  Height of Stairs: 12  Comments: pt ascended steps using step over step sequence and descended step by step sequence - bilateral hand rails used with both ascension and descension  GAIT: Gait pattern: step through pattern; LLE internally rotated with increased supination noted in Lt foot in stance Distance walked: 260' on 1st rep;  260' x 2nd rep with HHA near end of session Assistive device utilized: None Level of assistance: SBA - to min HHA Comments: No device  NEURO RE-ED:  Standing on Airex - pt performed marching 10 reps each leg with min UE support on // bars for improved SLS on compliant surface  Rockerboard anterior/posteriorly inside // bars 10 reps with UE support on // bars; 10 reps with 2 finger support  on // bars with EO  Pt performed cone taps to 4 cones - standing on floor - 3 reps to each cone with each foot;    THEREX: Sit to stand from high low mat table 3 reps with feet on floor   Leg press 60# 3 sets 10 reps bil. LE's - seat at 13   SciFit level 2.5 x 5"  with bil. UE's and LE's - goal to try to maintain at least 70 steps/min   Standing bil. Hip flexion, abduction and extension with 3# weight - forward, back and side kicks 10 reps each leg   Step ups onto 6" step - RLE 10 reps:  LLE 10 reps   Pt performed tap ups to 1st step with 3# on each leg 10 reps for hip flexor strengthening   PATIENT EDUCATION: Education details: walking program recommended Person educated: Parent Education method: Explanation Education comprehension: verbalized understanding   HOME EXERCISE PROGRAM: To be issued    GOALS: Goals reviewed with patient? Yes  SHORT TERM GOALS: Target date: 08-01-22 (3 weeks)  Amb. 465' (4 laps in gym) nonstop without device with SBA to demo increased endurance. Baseline: 230' with SBA Goal status: In progress - 08-13-22  2.  Improve TUG score to </= 13.5 secs without device to decrease fall risk.  Baseline: 15.12 secs Goal status: In progress - 08-13-22  3.  Perform HEP for balance and strengthening with mother's assist.  Baseline:  Goal status: Goal met 08-13-22   LONG TERM GOALS: Target date: 08-22-22 - 6 weeks  Pt will amb. 600' without device on flat, even surface without LOB with SBA. Baseline: 230'  Goal status: INITIAL  2.  Perform at least 10" on recumbent bike nonstop  for improved endurance/activity.  Baseline:  Goal status: INITIAL  3.  Improve TUG score to </= 11.5 secs without device to decrease fall risk.  Baseline: 15.12 secs without device Baseline:  Goal status: INITIAL  4.  Pt will perform sit to stand 5 times without use of UE support from mat table in </= 13 secs to demo improved LE strength. Baseline:   Goal status: INITIAL  5.   Provide updated HEP as appropriate to be performed with mother's assist. Baseline:  Goal status: INITIAL  ASSESSMENT:  CLINICAL IMPRESSION: PT session focused on bil. LE strengthening with use of 3# weight used for PRE's with pt performing exercises well with min. cues for correct position and speed.  Pt's LLE continues to internally rotate and Lt foot slightly supinates in swing phase of gait.  Cont to recommend walking program for cardiovascular conditioning and aerobic activity, as pt's mother states pt does not like to do rote, specific exercises at home.  Pt is progressing well towards goals.   Cont with POC.      OBJECTIVE IMPAIRMENTS Abnormal gait, decreased activity tolerance, decreased balance, decreased cognition, decreased endurance, and decreased strength.   ACTIVITY LIMITATIONS carrying, lifting, bending, squatting, stairs, and locomotion level  PARTICIPATION LIMITATIONS: meal prep, cleaning, laundry, and shopping  PERSONAL FACTORS Behavior pattern, Fitness, Past/current experiences, Time since onset of injury/illness/exacerbation, and 1 comorbidity: Behavior patterns/cognition  are also affecting patient's functional outcome.   REHAB POTENTIAL: Good for goals established  CLINICAL DECISION MAKING: Stable/uncomplicated  EVALUATION COMPLEXITY: Low  PLAN: PT FREQUENCY: 1x/week  PT DURATION: 6 weeks  PLANNED INTERVENTIONS: Therapeutic exercises, Therapeutic activity, Neuromuscular re-education, Balance training, Gait training, Patient/Family education, Self Care, and Stair training  PLAN FOR NEXT SESSION: LE strengthening and balance training   Tranesha Lessner, Donavan Burnet, PT 08/27/2022, 9:24 AM

## 2022-08-27 ENCOUNTER — Encounter: Payer: Self-pay | Admitting: Physical Therapy

## 2022-08-29 DIAGNOSIS — H538 Other visual disturbances: Secondary | ICD-10-CM | POA: Diagnosis not present

## 2022-08-29 DIAGNOSIS — R569 Unspecified convulsions: Secondary | ICD-10-CM | POA: Diagnosis not present

## 2022-09-10 ENCOUNTER — Ambulatory Visit: Payer: 59 | Admitting: Physical Therapy

## 2022-09-10 DIAGNOSIS — R2689 Other abnormalities of gait and mobility: Secondary | ICD-10-CM | POA: Diagnosis not present

## 2022-09-10 DIAGNOSIS — R2681 Unsteadiness on feet: Secondary | ICD-10-CM | POA: Diagnosis not present

## 2022-09-10 DIAGNOSIS — M6281 Muscle weakness (generalized): Secondary | ICD-10-CM | POA: Diagnosis not present

## 2022-09-10 NOTE — Therapy (Signed)
OUTPATIENT PHYSICAL THERAPY NEURO TREATMENT NOTE   Patient Name: Lori Miles MRN: [id number] DOB:[birth year], 27 y.o., female Today's Date: 09/11/2022   PCP: Karlyne Greenspan REFERRING PROVIDER: Elveria Rising, NP    PT End of Session - 09/11/22 1225     Visit Number 5    Number of Visits 7   eval + 6 sessions   Date for PT Re-Evaluation 09/06/22   pushed out due to scheduling   Authorization Type [facility] Medicare    Authorization Time Period 07-11-22 - 10-09-22    PT Start Time 0759    PT Stop Time 0845    PT Time Calculation (min) 46 min    Activity Tolerance Patient tolerated treatment well    Behavior During Therapy [facility] for tasks assessed/performed                  Past Medical History:  Diagnosis Date   Autism disorder    Bacteriuria    Chronic constipation    Cognitive developmental delay    Constipation    COVID-19 03/2020   Disruptive behavior disorder    Dyspnea    if she walks much   Family history of adverse reaction to anesthesia     "hives"-? if anesthesia did take antibiotic   Frequency-urgency syndrome    Gait disorder    ORGANIC PER NERUOLGIST NOTE (DR HICKLING)   Generalized convulsive epilepsy (HCC) NEUROLOGIST-  DR HICKLING   GERD (gastroesophageal reflux disease)    Gout    H/O sinus tachycardia    History of acute respiratory failure 02/14/2015   SECONDARY TO CAP   History of recurrent UTIs    Incontinent of feces    Interstitial cystitis    Intertrigo 11/29/2019   Language development disorder    answers yes and no only   Moderate intellectual disability    OSA (obstructive sleep apnea)    no machine yet    Partial epilepsy with impairment of consciousness (HCC)    FOLLOWED BY DR HICKLING   Pneumonia 2    4 times this year ( 2020)   PONV (postoperative nausea and vomiting)    Seizure (HCC)    04/26/22 over 20 years   Urinary incontinence    Past Surgical History:  Procedure Laterality Date   CYSTOSCOPY N/A  06/03/2017   Procedure: CYSTOSCOPY WITH EXAM UNDER ANESTHESIA;  Surgeon: Jerilee Field, MD;  Location[facility];  Service: Urology;  Laterality: N/A;   CYSTOSCOPY  2020   CYSTOSCOPY WITH HYDRODISTENSION AND BIOPSY  04/14/2012   Procedure: CYSTOSCOPY/BIOPSY/HYDRODISTENSION;  Surgeon: Lindaann Slough, MD;  Location: [facility];  Service: Urology;;  instillation of marcaine and pyridium   EUA/  PAP SMEAR/  BREAST EXAM  03-12-2016   dr Erin Fulling [facility]   EUA/ VAGINOSCOPY/ COLPOSCOPY FO THE HYMENAL RING  10-04-2009    dr Tamela Oddi  [facility]   IR RADIOLOGIST EVAL & MGMT  07/29/2019   IR RADIOLOGIST EVAL & MGMT  09/30/2019   IR SCLEROTHERAPY OF A FLUID COLLECTION  09/08/2019   RADIOLOGY WITH ANESTHESIA N/A 09/08/2019   Procedure: IR Sclerotherapy Treatment;  Surgeon: Radiologist, Medication, MD;  Location: [facility];  Service: Radiology;  Laterality: N/A;   TONSILLECTOMY     Patient Active Problem List   Diagnosis Date Noted   Poor balance 05/12/2022   Autism 04/30/2022   Well woman exam 04/30/2022   Moderate persistent asthma, uncomplicated 10/09/2021   Vitamin D deficiency 10/09/2021   Encounter  for long-term (current) use of high-risk medication 12/29/2020   Menorrhagia 12/16/2020   UTI symptoms 04/10/2020   Renal cyst 11/29/2019   Intertrigo 11/29/2019   Complex care coordination 05/18/2019   Chronic bladder pain 03/03/2019   Screening for cervical cancer 02/26/2019   Glucosuria 06/01/2018   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   Not well controlled moderate persistent asthma 04/29/2018   Gastroesophageal reflux disease 04/29/2018   Recurrent infections 04/29/2018   Dysuria 02/20/2018   Chronic rhinitis 04/03/2017   Recurrent falls 03/21/2017   Weakness 03/21/2017   Venous insufficiency 03/21/2017   OSA on CPAP 09/30/2016   Excessive daytime sleepiness 03/05/2016   Insomnia 10/04/2015   Acute recurrent pansinusitis 07/11/2015   Bronchitis  05/05/2015   Abnormality of gait 03/22/2015   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   Acanthosis nigricans 11/14/2014   Toeing-in 08/29/2014   Generalized convulsive epilepsy (HCC) 09/28/2012   Partial epilepsy with impairment of consciousness (HCC) 09/28/2012   Cognitive developmental delay 09/28/2012   Recurrent urinary tract infection 08/01/2011   COPD with asthma 08/01/2011   Chronic constipation 08/01/2011   Contraception 08/01/2011   Encopresis with constipation and overflow incontinence 02/15/2011   Functional urinary incontinence 02/02/2010   Other convulsions 09/05/2009    ONSET DATE: Referral date 09-05-21  REFERRING DIAG:  Diagnosis F81.9 (ICD-10-CM) - Cognitive developmental delay M20.5X9 (ICD-10-CM) - In-toeing, unspecified laterality E66.01 (ICD-10-CM) - Obesity, morbid F84.0 (ICD-10-CM) - Autism spectrum disorder R26.9 (ICD-10-CM) - Abnormality of gait R29.6 (ICD-10-CM) - Recurrent falls R53.1 (ICD-10-CM) - Weakness  THERAPY DIAG:  Other abnormalities of gait and mobility  Muscle weakness (generalized)  Unsteadiness on feet  Rationale for Evaluation and Treatment Rehabilitation  SUBJECTIVE:                                                                                                                                                                                              SUBJECTIVE STATEMENT: [name] reports pt is doing well - brought rollator in today; right brake is not working properly.  Mother states pt had problem with left leg on Saturday - "it didn't want to move"; recommended that she take rollator back to [facility] for brake to be  fixed  Pt accompanied by:  mother, [name]  PERTINENT HISTORY: Autism spectrum disorder, h/o generalized convulsive  epilepsy, steroid induced diabetes, h/o seizures, moderate intellectual disability  PAIN:  Are you having pain? Yes -  pt rates pain 10/10 but unsure if pt able to cognitively understand rating scale and rate accurately -points to her Rt knee and left foot ; mother states she has swelling in both legs  PRECAUTIONS: None  WEIGHT BEARING RESTRICTIONS No  FALLS: Has patient fallen in last 6 months? No  LIVING ENVIRONMENT: Lives with: lives with their family Lives in: House/apartment Stairs: No Has following equipment at home: Environmental consultant - 4 wheeled, Wheelchair (manual), and Tour manager  PLOF: Independent with household mobility with device, Needs assistance with ADLs, and Needs assistance with homemaking  Needs assistance with community ambulation (supervision needed due to decreased cognition)  PATIENT GOALS increase activity to increase strength; improve balance and endurance   OBJECTIVE:   STAIRS:  Level of Assistance:  SBA  Stair Negotiation Technique: Step to Pattern Alternating Pattern  with Bilateral Rails  Number of Stairs: 4 Steps x 3 reps =  12 steps total (3 reps consecutive)  Height of Stairs: 12  Comments: pt ascended steps using step over step sequence and descended step by step sequence - bilateral hand rails used with both ascension and descension  GAIT: Gait pattern: step through pattern; LLE internally rotated with increased supination noted in Lt foot in stance Distance walked: 230' on 1st rep with rollator;  230' x 2nd rep with HHA;  175' with SBA on 3rd rep at end of session Assistive device utilized: None Level of assistance: SBA - to min HHA Comments: No device  NEURO RE-ED:  Standing on Airex - pt performed marching 10 reps each leg with min UE support on // bars for improved SLS on compliant surface  Obstacle course - pt performed stepping  on stepping stones (6 used) various heights for improved SLS and feedforward balance reaction  Pt performed cone taps to 3 balance bubbles - standing on floor - 5 reps to each balance bubble with each foot with CGA  Pt stood on floor - tossed 1kg medicine ball to trampoline - pt unable to catch ball on the return; attempted this activity to facilitate improved dynamic balance and coordination with tossing and catching object  Pt stood on floor - reached down to pick up 5 cones individually and turned around 180 degrees to place on counter; mother reported that pt would not bend down to tie shoes or pick up anything at home - pt had no difficulty reaching down to retrieve cone off floor; pt then placed individual cone from countertop back down onto floor with SBA - pt had no LOB with this activity  Pt walked and tapped balloon with consistent cues to continue walking toward PT as pt tended to stop walking and then tap balloon - 15' total distance for this activity  THEREX: Sit to stand from high low mat table 5 reps with feet on floor - no UE support used   Leg press 60# 3 sets 10 reps bil. LE's - seat at 13   SciFit level 2.5 x 5"  with bil. UE's and LE's - goal to try to maintain at least 70 steps/min     PATIENT EDUCATION: Education details: walking program recommended Person educated: Parent Education method: Explanation Education comprehension: verbalized understanding   HOME EXERCISE PROGRAM: To be issued    GOALS: Goals reviewed with patient? Yes  SHORT TERM GOALS: Target date: 08-01-22 (3 weeks)  Amb. 465' (4 laps in  gym) nonstop without device with SBA to demo increased endurance. Baseline: 230' with SBA Goal status: In progress - 08-13-22  2.  Improve TUG score to </= 13.5 secs without device to decrease fall risk.  Baseline: 15.12 secs Goal status: In progress - 08-13-22  3.  Perform HEP for balance and strengthening with mother's assist.  Baseline:  Goal status:  Goal met 08-13-22   LONG TERM GOALS: Target date: 08-22-22 - 6 weeks;  New target date 09-30-22 (2 additional sessions only)   Pt will amb. 600' without device on flat, even surface without LOB with SBA. Baseline: 230'  Goal status: In progress - 09-10-22  2.  Perform at least 10" on recumbent bike nonstop for improved endurance/activity.  Baseline: 5" on 09-10-22 Goal status: In progress 09-10-22  3.  Improve TUG score to </= 11.5 secs without device to decrease fall risk.  Baseline: 15.12 secs without device;  Baseline:  Goal status: In progress  4.  Pt will perform sit to stand 5 times without use of UE support from mat table in </= 13 secs to demo improved LE strength. Baseline:   Goal status: In progress  5.  Provide updated HEP as appropriate to be performed with mother's assist. Baseline:  Goal status:  Ongoing - HEP has been initiated - will continue to update as appropriate  ASSESSMENT:  CLINICAL IMPRESSION: Pt has not fully met STG's but is progressing with increasing strength, balance and endurance.  Pt has only attended 4 treatment sessions since eval on 07-11-22.  Pt unable to catch 1 kg medicine ball from tossing against trampoline standing on floor due to decreased reaction time and coordination and possibly due to decreased comprehension with this activity.  Pt able to bend down to floor and pick up cones without difficulty and without LOB.  Cont with POC.      OBJECTIVE IMPAIRMENTS Abnormal gait, decreased activity tolerance, decreased balance, decreased cognition, decreased endurance, and decreased strength.   ACTIVITY LIMITATIONS carrying, lifting, bending, squatting, stairs, and locomotion level  PARTICIPATION LIMITATIONS: meal prep, cleaning, laundry, and shopping  PERSONAL FACTORS Behavior pattern, Fitness, Past/current experiences, Time since onset of injury/illness/exacerbation, and 1 comorbidity: Behavior patterns/cognition  are also affecting patient's  functional outcome.   REHAB POTENTIAL: Good for goals established  CLINICAL DECISION MAKING: Stable/uncomplicated  EVALUATION COMPLEXITY: Low  PLAN: PT FREQUENCY: 1x/week  PT DURATION: 6 weeks  PLANNED INTERVENTIONS: Therapeutic exercises, Therapeutic activity, Neuromuscular re-education, Balance training, Gait training, Patient/Family education, Self Care, and Stair training  PLAN FOR NEXT SESSION: LE strengthening and balance training; continue for 2 sessions per POC   Yesika Rispoli, Donavan Burnet, PT 09/11/2022, 12:27 PM

## 2022-09-11 ENCOUNTER — Encounter: Payer: Self-pay | Admitting: Physical Therapy

## 2022-09-17 DIAGNOSIS — J4520 Mild intermittent asthma, uncomplicated: Secondary | ICD-10-CM | POA: Diagnosis not present

## 2022-09-24 ENCOUNTER — Ambulatory Visit: Payer: 59 | Attending: Family | Admitting: Physical Therapy

## 2022-09-24 DIAGNOSIS — R2681 Unsteadiness on feet: Secondary | ICD-10-CM | POA: Insufficient documentation

## 2022-09-24 DIAGNOSIS — R2689 Other abnormalities of gait and mobility: Secondary | ICD-10-CM | POA: Insufficient documentation

## 2022-09-24 DIAGNOSIS — M6281 Muscle weakness (generalized): Secondary | ICD-10-CM | POA: Insufficient documentation

## 2022-09-24 NOTE — Therapy (Signed)
OUTPATIENT PHYSICAL THERAPY NEURO TREATMENT NOTE   Patient Name: Lori Miles MRN: [id number] DOB:June 07, 1995, 27 y.o., female Today's Date: 09/24/2022   PCP: Scifres, Peter Minium REFERRING PROVIDER: Elveria Rising, NP           Past Medical History:  Diagnosis Date   Autism disorder    Bacteriuria    Chronic constipation    Cognitive developmental delay    Constipation    COVID-19 03/2020   Disruptive behavior disorder    Dyspnea    if she walks much   Family history of adverse reaction to anesthesia     "hives"-? if anesthesia did take antibiotic   Frequency-urgency syndrome    Gait disorder    ORGANIC PER NERUOLGIST NOTE (DR HICKLING)   Generalized convulsive epilepsy (HCC) NEUROLOGIST-  DR HICKLING   GERD (gastroesophageal reflux disease)    Gout    H/O sinus tachycardia    History of acute respiratory failure 02/14/2015   SECONDARY TO CAP   History of recurrent UTIs    Incontinent of feces    Interstitial cystitis    Intertrigo 11/29/2019   Language development disorder    answers yes and no only   Moderate intellectual disability    OSA (obstructive sleep apnea)    no machine yet    Partial epilepsy with impairment of consciousness (HCC)    FOLLOWED BY DR HICKLING   Pneumonia 2    4 times this year ( 2020)   PONV (postoperative nausea and vomiting)    Seizure (HCC)    04/26/22 over 20 years   Urinary incontinence    Past Surgical History:  Procedure Laterality Date   CYSTOSCOPY N/A 06/03/2017   Procedure: CYSTOSCOPY WITH EXAM UNDER ANESTHESIA;  Surgeon: Jerilee Field, MD;  Location: [facility];  Service: Urology;  Laterality: N/A;   CYSTOSCOPY  2020   CYSTOSCOPY WITH HYDRODISTENSION AND BIOPSY  04/14/2012   Procedure: CYSTOSCOPY/BIOPSY/HYDRODISTENSION;  Surgeon: Lindaann Slough, MD;  Location: [facility];  Service: Urology;;  instillation of marcaine and pyridium   EUA/  PAP SMEAR/  BREAST EXAM  03-12-2016    dr Erin Fulling [facility]   EUA/ VAGINOSCOPY/ COLPOSCOPY FO THE HYMENAL RING  10-04-2009    dr Tamela Oddi  [facility]   IR RADIOLOGIST EVAL & MGMT  07/29/2019   IR RADIOLOGIST EVAL & MGMT  09/30/2019   IR SCLEROTHERAPY OF A FLUID COLLECTION  09/08/2019   RADIOLOGY WITH ANESTHESIA N/A 09/08/2019   Procedure: IR Sclerotherapy Treatment;  Surgeon: Radiologist, Medication, MD;  Location: [facility];  Service: Radiology;  Laterality: N/A;   TONSILLECTOMY     Patient Active Problem List   Diagnosis Date Noted   Poor balance 05/12/2022   Autism 04/30/2022   Well woman exam 04/30/2022   Moderate persistent asthma, uncomplicated 10/09/2021   Vitamin D deficiency 10/09/2021   Encounter for long-term (current) use of high-risk medication 12/29/2020   Menorrhagia 12/16/2020   UTI symptoms 04/10/2020   Renal cyst 11/29/2019   Intertrigo 11/29/2019   Complex care coordination 05/18/2019   Chronic bladder pain 03/03/2019   Screening for cervical cancer 02/26/2019   Glucosuria 06/01/2018   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   Not well controlled moderate persistent asthma 04/29/2018   Gastroesophageal reflux disease 04/29/2018   Recurrent infections 04/29/2018   Dysuria 02/20/2018   Chronic rhinitis 04/03/2017   Recurrent falls 03/21/2017   Weakness 03/21/2017   Venous insufficiency 03/21/2017   OSA on CPAP 09/30/2016   Excessive daytime  sleepiness 03/05/2016   Insomnia 10/04/2015   Acute recurrent pansinusitis 07/11/2015   Bronchitis 05/05/2015   Abnormality of gait 03/22/2015   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   Acanthosis nigricans 11/14/2014   Toeing-in 08/29/2014    Generalized convulsive epilepsy (HCC) 09/28/2012   Partial epilepsy with impairment of consciousness (HCC) 09/28/2012   Cognitive developmental delay 09/28/2012   Recurrent urinary tract infection 08/01/2011   COPD with asthma 08/01/2011   Chronic constipation 08/01/2011   Contraception 08/01/2011   Encopresis with constipation and overflow incontinence 02/15/2011   Functional urinary incontinence 02/02/2010   Other convulsions 09/05/2009    ONSET DATE: Referral date 09-05-21  REFERRING DIAG:  Diagnosis F81.9 (ICD-10-CM) - Cognitive developmental delay M20.5X9 (ICD-10-CM) - In-toeing, unspecified laterality E66.01 (ICD-10-CM) - Obesity, morbid F84.0 (ICD-10-CM) - Autism spectrum disorder R26.9 (ICD-10-CM) - Abnormality of gait R29.6 (ICD-10-CM) - Recurrent falls R53.1 (ICD-10-CM) - Weakness  THERAPY DIAG:  No diagnosis found.  Rationale for Evaluation and Treatment Rehabilitation  SUBJECTIVE:                                                                                                                                                                                              SUBJECTIVE STATEMENT: [name] reports feet is not as swollen as they have been in the past  Pt accompanied by:  mother, [name]  PERTINENT HISTORY: Autism spectrum disorder, h/o generalized convulsive epilepsy, steroid induced diabetes, h/o seizures, moderate intellectual disability  PAIN:  Are you having pain? Yes -  pt rates pain 10/10 but unsure if pt able to cognitively understand rating scale and rate accurately -points to her Rt knee and left foot ; mother states she has swelling in both legs  PRECAUTIONS: None  WEIGHT BEARING RESTRICTIONS No  FALLS: Has patient fallen in last 6 months? No  LIVING ENVIRONMENT: Lives with: lives with their family Lives in: House/apartment Stairs: No Has following equipment at home: Environmental consultant - 4 wheeled, Wheelchair (manual), and Tour manager  PLOF: Independent  with household mobility with device, Needs assistance with ADLs, and Needs assistance with homemaking  Needs assistance with community ambulation (supervision needed due to decreased cognition)  PATIENT GOALS increase activity to increase strength; improve balance and endurance   OBJECTIVE:   STAIRS:  Level of Assistance:  SBA  Stair Negotiation Technique: Step to Pattern Alternating Pattern  with Bilateral Rails  Number of Stairs: 4 Steps x 3 reps =  12 steps total (3 reps consecutive)  Height of Stairs: 12  Comments: pt ascended steps using step over step sequence and descended step by step sequence - bilateral hand rails used with both ascension and descension  GAIT: Gait pattern: step through pattern; LLE internally rotated with increased supination noted in Lt foot in stance Distance walked: 230' on 1st rep with rollator;  230' x 2nd rep with HHA;  175' with SBA on 3rd rep at end of session Assistive device utilized: None Level of assistance: SBA - to min HHA Comments: No device  NEURO RE-ED:  Standing on [employer] - pt performed marching 10 reps each leg with min UE support on // bars for improved SLS on compliant surface  Obstacle course - pt performed stepping on stepping stones (6 used) various heights for improved SLS and feedforward balance reaction  Pt performed cone taps to 3 balance bubbles - standing on floor - 5 reps to each balance bubble with each foot with CGA  Pt stood on floor - tossed 1kg medicine ball to trampoline - pt unable to catch ball on the return; attempted this activity to facilitate improved dynamic balance and coordination with tossing and catching object  Pt stood on floor - reached down to pick up 5 cones individually and turned around 180 degrees to place on counter; mother reported that pt would not bend down to tie shoes or pick up anything at home - pt had no difficulty reaching down to retrieve cone off floor; pt then placed individual cone from  countertop back down onto floor with SBA - pt had no LOB with this activity  Pt walked and tapped balloon with consistent cues to continue walking toward PT as pt tended to stop walking and then tap balloon - 15' total distance for this activity  THEREX: Sit to stand from high low mat table 5 reps with feet on floor - no UE support used   Leg press 60# 3 sets 10 reps bil. LE's - seat at 13   SciFit level 2.5 x 5"  with bil. UE's and LE's - goal to try to maintain at least 70 steps/min     PATIENT EDUCATION: Education details: walking program recommended Person educated: Parent Education method: Explanation Education comprehension: verbalized understanding   HOME EXERCISE PROGRAM: To be issued    GOALS: Goals reviewed with patient? Yes  SHORT TERM GOALS: Target date: 08-01-22 (3 weeks)  Amb. 465' (4 laps in gym) nonstop without device with SBA to demo increased endurance. Baseline: 230' with SBA Goal status: In progress - 08-13-22  2.  Improve TUG score to </= 13.5 secs without device to decrease fall risk.  Baseline: 15.12 secs Goal status: In progress - 08-13-22  3.  Perform HEP for balance and strengthening with mother's assist.  Baseline:  Goal status: Goal met 08-13-22   LONG TERM GOALS: Target date: 08-22-22 - 6 weeks;  New target date 09-30-22 (2 additional sessions only)   Pt will amb. 600' without device on flat, even surface without LOB with SBA. Baseline: 230'  Goal status: In progress - 09-10-22  2.  Perform at least 10" on recumbent bike nonstop for improved endurance/activity.  Baseline: 5" on 09-10-22 Goal status: In progress 09-10-22  3.  Improve TUG score to </= 11.5 secs without device to decrease fall risk.  Baseline:  15.12 secs without device;  Baseline:  Goal status: In progress  4.  Pt will perform sit to stand 5 times without use of UE support from mat table in </= 13 secs to demo improved LE strength. Baseline:   Goal status: In progress  5.   Provide updated HEP as appropriate to be performed with mother's assist. Baseline:  Goal status:  Ongoing - HEP has been initiated - will continue to update as appropriate  ASSESSMENT:  CLINICAL IMPRESSION: Pt has not fully met STG's but is progressing with increasing strength, balance and endurance.  Pt has only attended 4 treatment sessions since eval on 07-11-22.  Pt unable to catch 1 kg medicine ball from tossing against trampoline standing on floor due to decreased reaction time and coordination and possibly due to decreased comprehension with this activity.  Pt able to bend down to floor and pick up cones without difficulty and without LOB.  Cont with POC.      OBJECTIVE IMPAIRMENTS Abnormal gait, decreased activity tolerance, decreased balance, decreased cognition, decreased endurance, and decreased strength.   ACTIVITY LIMITATIONS carrying, lifting, bending, squatting, stairs, and locomotion level  PARTICIPATION LIMITATIONS: meal prep, cleaning, laundry, and shopping  PERSONAL FACTORS Behavior pattern, Fitness, Past/current experiences, Time since onset of injury/illness/exacerbation, and 1 comorbidity: Behavior patterns/cognition  are also affecting patient's functional outcome.   REHAB POTENTIAL: Good for goals established  CLINICAL DECISION MAKING: Stable/uncomplicated  EVALUATION COMPLEXITY: Low  PLAN: PT FREQUENCY: 1x/week  PT DURATION: 6 weeks  PLANNED INTERVENTIONS: Therapeutic exercises, Therapeutic activity, Neuromuscular re-education, Balance training, Gait training, Patient/Family education, Self Care, and Stair training  PLAN FOR NEXT SESSION: LE strengthening and balance training; continue for 2 sessions per POC   Aloysius Heinle, Donavan Burnet, PT 09/24/2022, 8:53 AM

## 2022-09-25 ENCOUNTER — Encounter: Payer: Self-pay | Admitting: Physical Therapy

## 2022-09-30 ENCOUNTER — Ambulatory Visit: Payer: 59 | Admitting: Physical Therapy

## 2022-09-30 DIAGNOSIS — M6281 Muscle weakness (generalized): Secondary | ICD-10-CM | POA: Diagnosis not present

## 2022-09-30 DIAGNOSIS — R2689 Other abnormalities of gait and mobility: Secondary | ICD-10-CM

## 2022-09-30 DIAGNOSIS — R2681 Unsteadiness on feet: Secondary | ICD-10-CM

## 2022-09-30 NOTE — Therapy (Unsigned)
OUTPATIENT PHYSICAL THERAPY NEURO TREATMENT NOTE/DISCHARGE SUMMARY   Patient Name: Lori Miles MRN: [id number] DOB:28-Jan-1996, 27 y.o., female Today's Date: 10/01/2022   PCP: Clementeen Graham, PA-C REFERRING PROVIDER: Elveria Rising, NP    PT End of Session - 10/01/22 2046     Visit Number 7    Number of Visits 7   eval + 6 sessions   Date for PT Re-Evaluation 10/04/22   pushed out due to scheduling   Authorization Type [facility] Medicare    Authorization Time Period 07-11-22 - 10-09-22    PT Start Time 0759    PT Stop Time 0845    PT Time Calculation (min) 46 min    Activity Tolerance Patient tolerated treatment well    Behavior During Therapy [facility] for tasks assessed/performed                    Past Medical History:  Diagnosis Date   Autism disorder    Bacteriuria    Chronic constipation    Cognitive developmental delay    Constipation    COVID-19 03/2020   Disruptive behavior disorder    Dyspnea    if she walks much   Family history of adverse reaction to anesthesia     "hives"-? if anesthesia did take antibiotic   Frequency-urgency syndrome    Gait disorder    ORGANIC PER NERUOLGIST NOTE (DR HICKLING)   Generalized convulsive epilepsy (HCC) NEUROLOGIST-  DR HICKLING   GERD (gastroesophageal reflux disease)    Gout    H/O sinus tachycardia    History of acute respiratory failure 02/14/2015   SECONDARY TO CAP   History of recurrent UTIs    Incontinent of feces    Interstitial cystitis    Intertrigo 11/29/2019   Language development disorder    answers yes and no only   Moderate intellectual disability    OSA (obstructive sleep apnea)    no machine yet    Partial epilepsy with impairment of consciousness (HCC)    FOLLOWED BY DR HICKLING   Pneumonia 2    4 times this year ( 2020)   PONV (postoperative nausea and vomiting)    Seizure (HCC)    04/26/22 over 20 years   Urinary incontinence    Past Surgical History:  Procedure Laterality Date    CYSTOSCOPY N/A 06/03/2017   Procedure: CYSTOSCOPY WITH EXAM UNDER ANESTHESIA;  Surgeon: Jerilee Field, MD;  Location: [facility];  Service: Urology;  Laterality: N/A;   CYSTOSCOPY  2020   CYSTOSCOPY WITH HYDRODISTENSION AND BIOPSY  04/14/2012   Procedure: CYSTOSCOPY/BIOPSY/HYDRODISTENSION;  Surgeon: Lindaann Slough, MD;  Location: [facility];  Service: Urology;;  instillation of marcaine and pyridium   EUA/  PAP SMEAR/  BREAST EXAM  03-12-2016   dr Erin Fulling [facility]   EUA/ VAGINOSCOPY/ COLPOSCOPY FO THE HYMENAL RING  10-04-2009    dr Tamela Oddi  [facility]   IR RADIOLOGIST EVAL & MGMT  07/29/2019   IR RADIOLOGIST EVAL & MGMT  09/30/2019   IR SCLEROTHERAPY OF A FLUID COLLECTION  09/08/2019   RADIOLOGY WITH ANESTHESIA N/A 09/08/2019   Procedure: IR Sclerotherapy Treatment;  Surgeon: Radiologist, Medication, MD;  Location: [facility] OR;  Service: Radiology;  Laterality: N/A;   TONSILLECTOMY     Patient Active Problem List   Diagnosis Date Noted   Poor balance 05/12/2022   Autism 04/30/2022   Well woman exam 04/30/2022   Moderate persistent asthma, uncomplicated 10/09/2021   Vitamin D deficiency 10/09/2021  Encounter for long-term (current) use of high-risk medication 12/29/2020   Menorrhagia 12/16/2020   UTI symptoms 04/10/2020   Renal cyst 11/29/2019   Intertrigo 11/29/2019   Complex care coordination 05/18/2019   Chronic bladder pain 03/03/2019   Screening for cervical cancer 02/26/2019   Glucosuria 06/01/2018   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   Not well controlled moderate persistent asthma 04/29/2018   Gastroesophageal reflux disease 04/29/2018   Recurrent infections 04/29/2018   Dysuria 02/20/2018   Chronic rhinitis 04/03/2017   Recurrent falls 03/21/2017   Weakness 03/21/2017   Venous insufficiency 03/21/2017   OSA on CPAP 09/30/2016   Excessive daytime sleepiness 03/05/2016   Insomnia 10/04/2015   Acute recurrent pansinusitis 07/11/2015    Bronchitis 05/05/2015   Abnormality of gait 03/22/2015   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   Acanthosis nigricans 11/14/2014   Toeing-in 08/29/2014   Generalized convulsive epilepsy (HCC) 09/28/2012   Partial epilepsy with impairment of consciousness (HCC) 09/28/2012   Cognitive developmental delay 09/28/2012   Recurrent urinary tract infection 08/01/2011   COPD with asthma 08/01/2011   Chronic constipation 08/01/2011   Contraception 08/01/2011   Encopresis with constipation and overflow incontinence 02/15/2011   Functional urinary incontinence 02/02/2010   Other convulsions 09/05/2009    ONSET DATE: Referral date 09-05-21  REFERRING DIAG:  Diagnosis F81.9 (ICD-10-CM) - Cognitive developmental delay M20.5X9 (ICD-10-CM) - In-toeing, unspecified laterality E66.01 (ICD-10-CM) - Obesity, morbid F84.0 (ICD-10-CM) - Autism spectrum disorder R26.9 (ICD-10-CM) - Abnormality of gait R29.6 (ICD-10-CM) - Recurrent falls R53.1 (ICD-10-CM) - Weakness  THERAPY DIAG:  Other abnormalities of gait and mobility  Muscle weakness (generalized)  Unsteadiness on feet  Rationale for Evaluation and Treatment Rehabilitation  SUBJECTIVE:                                                                                                                                                                                              SUBJECTIVE STATEMENT: [name] states the edema in pt's feet has gone down - says the exercise has helped a lot.  Mother requests that pt continue with PT; informed her that insurance will not pay for maintenance and that pt needs to exercise on a regular basis - at  [facility], walking program, etc.    Pt accompanied by:  mother, [name]  PERTINENT HISTORY:  Autism spectrum disorder, h/o generalized convulsive epilepsy, steroid induced diabetes, h/o seizures, moderate intellectual disability  PAIN:  Are you having pain? No pain reported on 09-24-22  PRECAUTIONS: None  WEIGHT BEARING RESTRICTIONS No  FALLS: Has patient fallen in last 6 months? No  LIVING ENVIRONMENT: Lives with: lives with their family Lives in: House/apartment Stairs: No Has following equipment at home: Environmental consultant - 4 wheeled, Wheelchair (manual), and Tour manager  PLOF: Independent with household mobility with device, Needs assistance with ADLs, and Needs assistance with homemaking  Needs assistance with community ambulation (supervision needed due to decreased cognition)  PATIENT GOALS increase activity to increase strength; improve balance and endurance   OBJECTIVE:   STAIRS:  Level of Assistance:  SBA  Stair Negotiation Technique: Step to Pattern Alternating Pattern  with Bilateral Rails  Number of Stairs: 4 Steps x 3 reps =  12 steps total (3 reps consecutive)  Height of Stairs: 12  Comments: pt ascended steps using step over step sequence and descended step by step sequence - bilateral hand rails used with both ascension and descension  GAIT: Gait pattern: step through pattern; LLE internally rotated with increased supination noted in Lt foot in stance Distance walked: 515'  Assistive device utilized: None Level of assistance: SBA - to min HHA Comments: No device  NEURO RE-ED:  Standing on Airex - pt performed marching 10 reps each leg with min UE support on // bars for improved SLS on compliant surface  Obstacle course - pt performed stepping on stepping stones (6 used) various heights for improved SLS and feedforward balance reaction - min HHA  Pt performed cone taps to 2 balance bubbles - standing on floor - 3  reps to each balance bubble with each foot with  CGA   THEREX: Sit to stand from high low mat table 5 reps with feet on floor - no UE support used   Leg press 50# 3 sets 10 reps bil. LE's - seat at 13   SciFit level 2.5 x 6"  with bil. UE's and LE's - goal to try to maintain at least 80 steps/min   5x sit to stand score 17.06 secs without device from mat table  PATIENT EDUCATION: Education details: walking program recommended Person educated: Parent Education method: Explanation Education comprehension: verbalized understanding   HOME EXERCISE PROGRAM: To be issued    GOALS: Goals reviewed with patient? Yes  SHORT TERM GOALS: Target date: 08-01-22 (3 weeks)  Amb. 465' (4 laps in gym) nonstop without device with SBA to demo increased endurance. Baseline: 230' with SBA Goal status: In progress - 08-13-22  2.  Improve TUG score to </= 13.5 secs without device to decrease fall risk.  Baseline: 15.12 secs Goal status: In progress - 08-13-22  3.  Perform HEP for balance and strengthening with mother's assist.  Baseline:  Goal status: Goal met 08-13-22   LONG TERM GOALS: Target date: 08-22-22 - 6 weeks;  New target date 09-30-22 (2 additional sessions only)   Pt will amb. 600' without device on flat, even surface without LOB with SBA. Baseline: 230';  515' on 09-30-22 Goal status: Partially met 09-30-22  2.  Perform at least 10" on recumbent bike nonstop for improved endurance/activity.  Baseline: 6" on 09-30-22 Goal status: Not met - 09-30-22  3.  Improve TUG score to </= 11.5 secs without device to decrease fall risk.  Baseline: 15.12 secs without device;   09-30-22 -- 14.44 secs without device Baseline:  Goal status:  Not met 09-30-22  4.  Pt will perform sit to stand 5 times without use of UE support from mat table in </= 13 secs to demo improved LE strength. Baseline:  17.06 secs Goal status: Not met  5.  Provide updated HEP as appropriate to be performed with mother's assist. Baseline:  Goal status:  Goal met  09-30-22  ASSESSMENT:  CLINICAL IMPRESSION: Pt has met LTG #5;  LTG#1 partially met as pt able to amb. 515' without device with SBA, but not 600' per stated LTG due to fatigue after amb. 515'.  LTG's #2 and #3 not met as pt's TUG score 14.44 secs, improved from 15.12 secs but not to stated goal level.  LTG #2 not met due to fatigue/pt stopping exercise on SciFit after riding bike for 6".  LTG #4 not met as pt's 5x sit to stand score 17.06 secs (goal </= 13 secs).  Pt is discharged due to completion of program and plateau in maximizing functional status at this time.  Mother has been informed that pt does need to exercise on regular basis, but no need for skilled PT services warranted at current time.     OBJECTIVE IMPAIRMENTS Abnormal gait, decreased activity tolerance, decreased balance, decreased cognition, decreased endurance, and decreased strength.   ACTIVITY LIMITATIONS carrying, lifting, bending, squatting, stairs, and locomotion level  PARTICIPATION LIMITATIONS: meal prep, cleaning, laundry, and shopping  PERSONAL FACTORS Behavior pattern, Fitness, Past/current experiences, Time since onset of injury/illness/exacerbation, and 1 comorbidity: Behavior patterns/cognition  are also affecting patient's functional outcome.   REHAB POTENTIAL: Good for goals established  CLINICAL DECISION MAKING: Stable/uncomplicated  EVALUATION COMPLEXITY: Low  PLAN: PT FREQUENCY: 1x/week  PT DURATION: 6 weeks  PLANNED INTERVENTIONS: Therapeutic exercises, Therapeutic activity, Neuromuscular re-education, Balance training, Gait training, Patient/Family education, Self Care, and Stair training  PLAN FOR NEXT SESSION: D/C 09-30-22 - program completed - pt has plateaued in maximizing functional status in PT at this time    PHYSICAL THERAPY DISCHARGE SUMMARY  Visits from Start of Care: 7  Current functional level related to goals / functional outcomes: See above for progress towards goals    Remaining deficits: Pt continues to have gait deviation including LLE internal rotation with supination in swing phase of gait.  Pt is able to amb. Short community distances without use of device.  Pt has rollator for assistance with prolonged ambulation distance in community.    Decreased high level balance skills   Education / Equipment: [name] has been instructed in HEP and activities to facilitate improved standing balance.  Consistent participation in exercise program such as going to [facility] for riding recumbent bike and/or walking on track has been recommended to pt's mother. Mother reports pt will only do a couple of laps at the [facility] and then she stops.  Pt's behavior impacts participation in HEP/walking program.   Patient agrees to discharge. Patient goals were not met. Patient is being discharged due to maximized rehab potential.  Pt has completed number of requested authorized visits from insurance - no need for skilled PT intervention at this time - pt does need to exercise on regular basis but skilled PT is not warranted at this time - unable to justify need for continuation of PT services.       Kary Kos, PT 10/01/2022, 8:52 PM

## 2022-10-01 ENCOUNTER — Encounter: Payer: Self-pay | Admitting: Physical Therapy

## 2022-10-03 ENCOUNTER — Telehealth: Payer: Self-pay | Admitting: Internal Medicine

## 2022-10-03 DIAGNOSIS — N390 Urinary tract infection, site not specified: Secondary | ICD-10-CM | POA: Diagnosis not present

## 2022-10-03 NOTE — Telephone Encounter (Signed)
I called PT to set up a FU appt w/Dr. Celine Mans. Mom states daughter has monthly UTI's and because of that does not want to use her CPAP at night. Would not make appt w/o conferring with the Dr. Tiajuana Amass. Please call mom @

## 2022-10-04 NOTE — Telephone Encounter (Signed)
Called and spoke with patient's mother. She received a call yesterday from the the front desk to get [facility] scheduled for her 3 month follow up in August. She wants to hold off until at least September because Nick has not been wearing her cpap machine. She has been dealing with constant UTIs and not able to sleep well at night. I advised her that it would be ok to hold off until September, she verbalized understanding.    Nothing further needed at time of call.

## 2022-10-04 NOTE — Telephone Encounter (Signed)
[name] mother is returning phone call. [name] phone number is [phone].

## 2022-10-08 ENCOUNTER — Telehealth: Payer: Self-pay | Admitting: Allergy and Immunology

## 2022-10-08 NOTE — Telephone Encounter (Signed)
Patient's mom called requesting to have a nurse give her a call back. Mom stated she could not talk at the moment but to give her a few minutes and call her back.

## 2022-10-08 NOTE — Telephone Encounter (Signed)
Called patient - DOB/DPR verified - LMOVM to contact office.

## 2022-10-08 NOTE — Telephone Encounter (Signed)
Banita from [employer] called and stated the medication cetirizine cetirizine (ZYRTEC) 10 MG tablet is not covered by [employer] anymore and patient needs a prior authorization or switch medication. Call back number [phone].

## 2022-10-10 ENCOUNTER — Telehealth: Payer: Self-pay

## 2022-10-10 ENCOUNTER — Other Ambulatory Visit (HOSPITAL_COMMUNITY): Payer: Self-pay

## 2022-10-10 NOTE — Telephone Encounter (Signed)
Pharmacy Patient Advocate Encounter   Received notification from Pt Calls Messages that prior authorization for Cetirizine HCl 10MG  tablets is required/requested.   Insurance verification completed.   The patient is insured through [employer] .   Per test claim: PA required; PA submitted to [facility] via CoverMyMeds Key/confirmation #/EOC [facility] Status is pending

## 2022-10-11 NOTE — Telephone Encounter (Signed)
I called and spoke with the patients mother and she stated that she was not at home and didn't have the medication with her that she needed to discuss. She stated that she will call back on Monday.

## 2022-10-14 NOTE — Telephone Encounter (Signed)
Pharmacy Patient Advocate Encounter  Received notification from [facility] that Prior Authorization for Cetirizine HCl 10MG  tablets has been DENIED. Please advise how you'd like to proceed. Full denial letter will be uploaded to the media tab. See denial reason below.  Over the counter (OTC) or non-prescription drugs are excluded from coverage under Medicare rules.   PA #/Case ID/Reference #: [id number]  Please be advised we currently do not have a Pharmacist to review denials, therefore you will need to process appeals accordingly as needed. Thanks for your support at this time. Contact for appeals are as follows: Phone: [phone], Fax: n/a

## 2022-10-15 DIAGNOSIS — L249 Irritant contact dermatitis, unspecified cause: Secondary | ICD-10-CM | POA: Diagnosis not present

## 2022-10-15 DIAGNOSIS — L700 Acne vulgaris: Secondary | ICD-10-CM | POA: Diagnosis not present

## 2022-10-22 DIAGNOSIS — N390 Urinary tract infection, site not specified: Secondary | ICD-10-CM | POA: Diagnosis not present

## 2022-10-23 DIAGNOSIS — J4520 Mild intermittent asthma, uncomplicated: Secondary | ICD-10-CM | POA: Diagnosis not present

## 2022-10-25 ENCOUNTER — Other Ambulatory Visit: Payer: Self-pay | Admitting: Allergy and Immunology

## 2022-10-29 NOTE — Telephone Encounter (Signed)
Patient's mother called back to discuss getting approval for a medication and needs a prescription corrected.   Best contact number: [phone]

## 2022-10-30 ENCOUNTER — Telehealth: Payer: Self-pay

## 2022-10-30 NOTE — Telephone Encounter (Signed)
Pts mother is scheduled to see Dr. Rhea Belton 01/24/23. She is asking if Magline needs an appt. She reports she is doing fine and not having any issues.States she usually does their appts together so she does not have to come to the office twice. Please advise.

## 2022-10-30 NOTE — Telephone Encounter (Signed)
Ok to double book with me on same day

## 2022-10-31 NOTE — Telephone Encounter (Signed)
Called and left a voicemail asking for return call to discuss.  

## 2022-10-31 NOTE — Telephone Encounter (Signed)
Pt scheduled to see Dr. Rhea Belton 01/24/23 at 8:30am, appt double booked as approved per Dr. Rhea Belton. Other open slot placed on hold to make up for the double book. PTs mother aware of appt.

## 2022-11-04 MED ORDER — FORMOTEROL FUMARATE 20 MCG/2ML IN NEBU: 20.0000 ug | INHALATION_SOLUTION | Freq: Two times a day (BID) | RESPIRATORY_TRACT | 3 refills | Status: DC

## 2022-11-04 NOTE — Addendum Note (Signed)
Addended by: Orson Aloe on: 11/04/2022 04:34 PM   Modules accepted: Orders

## 2022-11-05 NOTE — Telephone Encounter (Signed)
Patient's mother called yesterday due to the perforomist nebulizer solution being denied. The perforomist nebulizer solution has been sent in. Patient has also been able to get the zytec from the pharmacy.

## 2022-11-11 DIAGNOSIS — N390 Urinary tract infection, site not specified: Secondary | ICD-10-CM | POA: Diagnosis not present

## 2022-11-13 ENCOUNTER — Other Ambulatory Visit: Payer: Self-pay | Admitting: Allergy and Immunology

## 2022-11-13 ENCOUNTER — Telehealth: Payer: Self-pay | Admitting: Allergy and Immunology

## 2022-11-13 MED ORDER — MONTELUKAST SODIUM 10 MG PO TABS: 10.0000 mg | ORAL_TABLET | Freq: Every evening | ORAL | 0 refills | Status: DC

## 2022-11-13 NOTE — Telephone Encounter (Signed)
Sent in 1 curtesy refill pt does need to be seen mom informed

## 2022-11-13 NOTE — Progress Notes (Signed)
Lori Miles   MRN:  [id number]  Jul 07, 1995   Provider: Elveria Rising NP-C Location of Care: [facility]  Visit type: Return visit  Last visit: 05/09/2022  Referral source: Scifres, [name], PA-C (Inactive) History from: Epic chart and patient's mother  Brief history:  Copied from previous record: Generalized and convulsive epilepsy, intellectual disability, autism spectrum disorder, disordered sleep and gait disorder. She also has asthma and history of frequent urinary tract infections. She is taking and tolerating Depakote for seizures and Trazodone for insomnia  Today's concerns: Mom reports that Lori Miles was diagnosed with UTI yesterday and will start antibiotic today Lori Miles has intermittent problems with skin picking and has recently pulled at her skin until she removed an entire toenail.  Lori Miles has ongoing problems with gait and low stamina. She has gained weight since her last visit which also has reduced her ability to walk.  Mom believes her weight gain is from snacks given to her by relatives and because she is very inactive.  Mom notes that she needs an order to restart PT in September. Mom reports that Lori Miles screams out at times, startles easily and is restless at times. She feels that these behaviors may be worse when she has an asthma exacerbation or UTI.  Lori Miles has been otherwise generally healthy since she was last seen. No health concerns today other than previously mentioned.  Review of systems: Please see HPI for neurologic and other pertinent review of systems. Otherwise all other systems were reviewed and were negative.  Problem List: Patient Active Problem List   Diagnosis Date Noted   Poor balance 05/12/2022   Autism 04/30/2022   Well woman exam 04/30/2022   Moderate persistent asthma, uncomplicated 10/09/2021   Vitamin D deficiency 10/09/2021   Encounter for long-term (current) use of high-risk  medication 12/29/2020   Menorrhagia 12/16/2020   UTI symptoms 04/10/2020   Renal cyst 11/29/2019   Intertrigo 11/29/2019   Complex care coordination 05/18/2019   Chronic bladder pain 03/03/2019   Screening for cervical cancer 02/26/2019   Glucosuria 06/01/2018   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   Not well controlled moderate persistent asthma 04/29/2018   Gastroesophageal reflux disease 04/29/2018   Recurrent infections 04/29/2018   Dysuria 02/20/2018   Chronic rhinitis 04/03/2017   Recurrent falls 03/21/2017   Weakness 03/21/2017   Venous insufficiency 03/21/2017   OSA on CPAP 09/30/2016   Excessive daytime sleepiness 03/05/2016   Insomnia 10/04/2015   Acute recurrent pansinusitis 07/11/2015   Bronchitis 05/05/2015   Abnormality of gait 03/22/2015   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 if anesthesia did take antibiotic   Frequency-urgency syndrome    Gait disorder    ORGANIC PER NERUOLGIST NOTE (DR HICKLING)   Generalized convulsive epilepsy (HCC) NEUROLOGIST-  DR HICKLING   GERD (gastroesophageal reflux disease)    Gout    H/O sinus tachycardia    History of acute respiratory failure 02/14/2015   SECONDARY TO CAP   History of recurrent UTIs    Incontinent of feces    Interstitial cystitis    Intertrigo 11/29/2019   Language development disorder    answers yes and no only   Moderate intellectual disability    OSA (obstructive sleep apnea)    no machine yet    Partial epilepsy with impairment of consciousness (HCC)    FOLLOWED BY DR HICKLING   Pneumonia 2    4 times this year ( 2020)   PONV (postoperative nausea and vomiting)    Seizure (HCC)    04/26/22 over 20 years   Urinary incontinence     Past medical history comments: See HPI Copied from previous record: EEG on September 14, 2011, was normal. She was able to be successfully weaned off Keppra. The patient has significant issues with aggression, which was thought to be related to Keppra, but has persisted once it was discontinued. The patient was treated with Depakote both for behavior and possible seizures. She had palpitations and severe constipation on Seroquel. She had marked weight gain on Abilify and Depakote. When generic Divalproex was tried she had breakthrough seizures.      Nocturnal polysomnogram on October 27, 2011, failed to show significant periodic limb movements, cyanosis, sleep apnea, or cardiac arrhythmia.  EEG on February 20, 2012, was a normal study with the patient awake.    EEG performed March 25, 2017 was normal   EEG performed May 21, 2018 was normal  Surgical history: Past Surgical History:  Procedure Laterality Date   CYSTOSCOPY N/A 06/03/2017   Procedure: CYSTOSCOPY WITH EXAM UNDER ANESTHESIA;  Surgeon: Jerilee Field, MD;  Location: [facility];  Service: Urology;  Laterality: N/A;   CYSTOSCOPY  2020   CYSTOSCOPY WITH HYDRODISTENSION AND BIOPSY  04/14/2012   Procedure: CYSTOSCOPY/BIOPSY/HYDRODISTENSION;  Surgeon: Lindaann Slough, MD;  Location: [facility];  Service: Urology;;  instillation of marcaine and pyridium   EUA/  PAP SMEAR/  BREAST EXAM  03-12-2016   dr Erin Fulling [facility]   EUA/ VAGINOSCOPY/ COLPOSCOPY FO THE HYMENAL RING  10-04-2009    dr Tamela Oddi  [facility]   IR RADIOLOGIST EVAL & MGMT  07/29/2019   IR RADIOLOGIST EVAL & MGMT  09/30/2019   IR SCLEROTHERAPY OF A FLUID COLLECTION  09/08/2019   RADIOLOGY WITH ANESTHESIA N/A 09/08/2019   Procedure: IR Sclerotherapy Treatment;  Surgeon: Radiologist, Medication, MD;  Location: [facility];  Service: Radiology;  Laterality: N/A;   TONSILLECTOMY      Family history: family history includes Pancreatic cancer in her sister; Seizures in her mother and sister.   Social history: Social History   Socioeconomic History   Marital status: Single    Spouse name: Not on file   Number of children: Not on file   Years of education: Not on file   Highest education level: Not on file  Occupational History   Not on file  Tobacco Use   Smoking status: Never   Smokeless tobacco: Never  Vaping Use   Vaping status: Never Used  Substance and Sexual Activity   Alcohol use: No    Alcohol/week: 0.0 standard drinks of alcohol   Drug use: No   Sexual activity: Never    Birth control/protection: Pill  Other Topics Concern   Not on file  Social History Narrative   Lives with mom ([name] Weatherford) and half-sister Danaysia Scavone) who is also mentally impaired.  Has CAP assistance, urinary incontinence, needs help with feeding,dressing, toileting   Graduated from [employer].  She enjoys playing with cars, dancing, and dogs.   Social Determinants of Health   Financial Resource Strain: Not on file  Food Insecurity: Not on file  Transportation Needs: Not on  file  Physical Activity: Not on file  Stress: Not on file  Social Connections: Not on file  Intimate Partner Violence: Not on file    Past/failed meds: Copied from previous record: Levetiracetam - aggressive behavior Divalproex - breakthrough seizures   Allergies: Allergies  Allergen Reactions   Abilify [Aripiprazole] Swelling and Palpitations   Seroquel [Quetiapine Fumarate] Palpitations   Amoxicillin-Pot Clavulanate Hives and Other (See Comments)    Has patient had a PCN reaction causing immediate rash, facial/tongue/throat swelling, SOB or lightheadedness with hypotension: No Has patient had a PCN reaction causing severe rash involving mucus membranes or skin necrosis:    #  #  YES  #  #  Has patient had a PCN reaction that required hospitalization: No Has patient had a PCN reaction occurring within the last 10 years: No    Bactrim [Sulfamethoxazole-Trimethoprim] Other (See Comments)    Abdominal pain   Doxycycline Other (See Comments)    Stomach cramps   Macrobid [Nitrofurantoin Macrocrystal] Other (See Comments)    Sharp pain/ given with Tylenol 325 mg   Keppra [Levetiracetam] Other (See Comments)    Aggressive behavior     Immunizations: Immunization History  Administered Date(s) Administered   DTaP 02/06/1995, 04/30/1995, 07/02/1995, 01/14/1996, 10/10/1998   HIB (PRP-T) 02/06/1995, 04/30/1995, 07/02/1995, 01/14/1996   Hepatitis B, PED/ADOLESCENT 10/23/1994, 02/06/1995, 01/14/1996   IPV 02/06/1995, 04/30/1995, 07/02/1995, 01/14/1996, 10/10/1998   MMR 01/14/1996, 10/10/1998   Pneumococcal Polysaccharide-23 08/06/2016, 03/07/2020   Tdap 03/03/2017   Varicella 01/14/1996    Diagnostics/Screenings: Copied from previous record: 12/15/19 rEEG - This EEG is normal during awake and asleep states. Please note that normal EEG does not exclude epilepsy, clinical correlation is indicated.  Keturah Shavers, MD   03/27/17 - CT head - normal  Physical Exam: BP 100/60   Pulse  (!) 110   Wt 178 lb 3.2 oz (80.8 kg)   BMI 37.89 kg/m   Wt Readings from Last 3 Encounters:  11/14/22 178 lb 3.2 oz (80.8 kg)  07/22/22 156 lb (70.8 kg)  04/30/22 150 lb (68 kg)    General: well developed, well nourished obese young woman, seated, in exam room, in no evident distress Head: normocephalic and atraumatic. Oropharynx difficult to examine but appears benign. No dysmorphic features. Neck: supple Cardiovascular: regular rate and rhythm, no murmurs. Respiratory: clear to auscultation bilaterally Abdomen: bowel sounds present all four quadrants, abdomen soft, non-tender, non-distended.  Musculoskeletal: no skeletal deformities or obvious scoliosis.  Skin: no rashes or neurocutaneous lesions  Neurologic Exam Mental Status: awake and fully alert. Has limited language. Variable eye contact. Smiles responsively. Behavior is immature. Needs frequent redirection. Cranial Nerves:  fundoscopic exam - red reflex present.  Unable to fully visualize fundus.  Pupils equal briskly reactive to light.  Turns to localize faces and objects in the periphery. Turns to localize sounds in the periphery. Facial movements are symmetric Motor: normal functional bulk, tone and strength Sensory: withdrawal x 4 Coordination: unable to adequately assess due to patient's inability to participate in examination. No dysmetria when reaching for objects. Gait and Station: able to stand and bear weight. Able to take steps but has poor balance and needs support.  Reflexes: diminished and symmetric. Toes neutral. No clonus   Impression: Generalized convulsive epilepsy (HCC) - Plan: DEPAKOTE 500 MG DR tablet  Partial epilepsy with impairment of consciousness (HCC) - Plan: DEPAKOTE 500 MG DR tablet  Sleep arousal disorder  Disruptive behavior disorder  Recurrent urinary tract infection  COPD with asthma  Cognitive developmental delay  In-toeing, unspecified laterality - Plan: Ambulatory referral to  Physical Therapy  Obesity, morbid (HCC) - Plan: Ambulatory referral to Physical Therapy  Autism spectrum disorder  Abnormality of gait - Plan: Ambulatory referral to Physical Therapy  Recurrent falls - Plan: Ambulatory referral to Physical Therapy  Weakness - Plan: Ambulatory referral to Physical Therapy  Poor balance - Plan: Ambulatory referral to Physical Therapy  Compulsive skin picking  Weight gain   Recommendations for plan of care: The patient's previous Epic records were reviewed. No recent diagnostic studies to be reviewed with the patient. I am concerned about her weight gain and talked with Mom about limiting snacks and portion sizes Plan until next visit: Continue medications as prescribed  PT order sent Call for questions or concerns Return in about 6 months (around 05/16/2023).  The medication list was reviewed and reconciled. No changes were made in the prescribed medications today. A complete medication list was provided to the patient.  Orders Placed This Encounter  Procedures   Ambulatory referral to Physical Therapy    Referral Priority:   Routine    Referral Type:   Physical Medicine    Referral Reason:   Specialty Services Required    Requested Specialty:   Physical Therapy    Number of Visits Requested:   1     Allergies as of 11/14/2022       Reactions   Abilify [aripiprazole] Swelling, Palpitations   Seroquel [quetiapine Fumarate] Palpitations   Amoxicillin-pot Clavulanate Hives, Other (See Comments)   Has patient had a PCN reaction causing immediate rash, facial/tongue/throat swelling, SOB or lightheadedness with hypotension: No Has patient had a PCN reaction causing severe rash involving mucus membranes or skin necrosis:    #  #  YES  #  #  Has patient had a PCN reaction that required hospitalization: No Has patient had a PCN reaction occurring within the last 10 years: No   Bactrim [sulfamethoxazole-trimethoprim] Other (See Comments)    Abdominal pain   Doxycycline Other (See Comments)   Stomach cramps   Macrobid [nitrofurantoin Macrocrystal] Other (See Comments)   Sharp pain/ given with Tylenol 325 mg   Keppra [levetiracetam] Other (See Comments)   Aggressive behavior         Medication List        Accurate as of November 14, 2022 11:59 PM. If you have any questions, ask your nurse or doctor.          acetaminophen 325 MG tablet Commonly known as: TYLENOL Take 2 tablets (650 mg total) by mouth every 6 (six) hours as needed for mild pain or moderate  pain.   albuterol 108 (90 Base) MCG/ACT inhaler Commonly known as: VENTOLIN HFA Inhale 2 puffs into the lungs every 4 (four) hours as needed for wheezing or shortness of breath.   albuterol (2.5 MG/3ML) 0.083% nebulizer solution Commonly known as: PROVENTIL Take 3 mLs (2.5 mg total) by nebulization every 4 (four) hours as needed for wheezing or shortness of breath.   benzonatate 200 MG capsule Commonly known as: TESSALON Take 200 mg by mouth 3 (three) times daily.   cefUROXime 500 MG tablet Commonly known as: CEFTIN Take 500 mg by mouth 2 (two) times daily.   cetirizine 10 MG tablet Commonly known as: ZYRTEC Take 1 tablet (10 mg total) by mouth 2 (two) times daily as needed for allergies (Can use an extra dose during flare ups). What changed:  when to take this additional instructions   Depakote 500 MG DR tablet Generic drug: divalproex TAKE 1 TABLET BY MOUTH IN THE MORNING AND 2 AT NIGHT   famotidine 20 MG tablet Commonly known as: PEPCID Take 1 tablet (20 mg total) by mouth 2 (two) times daily. TAKE 1 TABLET(20 MG) BY MOUTH EVERY 12 HOURS   FeroSul 325 (65 Fe) MG tablet Generic drug: ferrous sulfate Take 325 mg by mouth daily with breakfast.   fluconazole 150 MG tablet Commonly known as: DIFLUCAN Take 1 tablet (150 mg total) by mouth as needed (when on antibiotics).   formoterol 20 MCG/2ML nebulizer solution Commonly known as:  Perforomist Take 2 mLs (20 mcg total) by nebulization 2 (two) times daily. USE 1 VIAL VIA NEBULIZER TWICE DAILY   hydrOXYzine 25 MG tablet Commonly known as: ATARAX Take 25 mg by mouth 3 (three) times daily as needed (pain).   ipratropium-albuterol 0.5-2.5 (3) MG/3ML Soln Commonly known as: DUONEB Take 3 mLs by nebulization every 4 (four) hours as needed.   melatonin 3 MG Tabs tablet Take 2 tablets (6mg ) by mouth at bedtime. What changed:  how much to take how to take this when to take this additional instructions   montelukast 10 MG tablet Commonly known as: SINGULAIR Take 1 tablet (10 mg total) by mouth at bedtime. TAKE 1 TABLET(10 MG) BY MOUTH AT BEDTIME   Nebulizer Misc Adult mask with tubing. Use as directed with nebulizer device.   ondansetron 4 MG disintegrating tablet Commonly known as: Zofran ODT Take 2 tablets (8 mg total) by mouth every 8 (eight) hours as needed for nausea or vomiting.   polyethylene glycol powder 17 GM/SCOOP powder Commonly known as: GLYCOLAX/MIRALAX DISSOLVE 1 CAPFUL(17 GRAMS) IN AT LEAST 8 OUNCES OF WATER/JUICE TWICE DAILY   Pulmicort 1 MG/2ML nebulizer solution Generic drug: budesonide Take 2 mLs (1 mg total) by nebulization in the morning, at noon, in the evening, and at bedtime. USE 1 VIAL VIA NEBULIZER FOUR TIMES DAILY DURING RESPITORY INFECTIONS AND ASTHMA   Seasonique 0.15-0.03 &0.01 MG tablet Generic drug: Levonorgestrel-Ethinyl Estradiol Take 1 tablet by mouth daily. What changed: Another medication with the same name was removed. Continue taking this medication, and follow the directions you see here. Changed by: Elveria Rising   senna 8.6 MG tablet Commonly known as: SENOKOT Take 1 tablet by mouth at bedtime.   terconazole 0.8 % vaginal cream Commonly known as: TERAZOL 3 In pull-up alternate every other week with A&D   traZODone 50 MG tablet Commonly known as: DESYREL TAKE 2 TABLETS(100 MG) BY MOUTH AT BEDTIME    vitamin A & D ointment Apply 1 Application topically See admin instructions. Put  In pull up every other week alternate with Terconazole   Vitamin D3 1.25 MG (50000 UT) Caps Take 1 capsule by mouth once a week.      Total time spent with the patient was 25 minutes, of which 50% or more was spent in counseling and coordination of care.  Elveria Rising NP-C [facility] [address] [place], [place] [address] Ph. [phone] Fax [phone]

## 2022-11-13 NOTE — Patient Instructions (Incomplete)
It was a pleasure to see you today!  Instructions for you until your next appointment are as follows: Continue medications as prescribed Please sign up for MyChart if you have not done so. Please plan to return for follow up in 6 months or sooner if needed.  Feel free to contact our office during normal business hours at [phone] with questions or concerns. If there is no answer or the call is outside business hours, please leave a message and our clinic staff will call you back within the next business day.  If you have an urgent concern, please stay on the line for our after-hours answering service and ask for the on-call neurologist.     I also encourage you to use MyChart to communicate with me more directly. If you have not yet signed up for MyChart within [facility], the front desk staff can help you. However, please note that this inbox is NOT monitored on nights or weekends, and response can take up to 2 business days.  Urgent matters should be discussed with the on-call pediatric neurologist.   At [facility], we are committed to providing exceptional care. You will receive a patient satisfaction survey through text or email regarding your visit today. Your opinion is important to me. Comments are appreciated.

## 2022-11-13 NOTE — Telephone Encounter (Signed)
Patients mother called requesting a refill on medication montelukast (SINGULAIR) 10 MG tablet [[id number]] please advise

## 2022-11-14 ENCOUNTER — Ambulatory Visit (INDEPENDENT_AMBULATORY_CARE_PROVIDER_SITE_OTHER): Payer: 59 | Admitting: Family

## 2022-11-14 ENCOUNTER — Encounter (INDEPENDENT_AMBULATORY_CARE_PROVIDER_SITE_OTHER): Payer: Self-pay | Admitting: Family

## 2022-11-14 VITALS — BP 100/60 | HR 110 | Wt 178.2 lb

## 2022-11-14 DIAGNOSIS — F919 Conduct disorder, unspecified: Secondary | ICD-10-CM

## 2022-11-14 DIAGNOSIS — R2689 Other abnormalities of gait and mobility: Secondary | ICD-10-CM | POA: Diagnosis not present

## 2022-11-14 DIAGNOSIS — R531 Weakness: Secondary | ICD-10-CM | POA: Diagnosis not present

## 2022-11-14 DIAGNOSIS — J4489 Other specified chronic obstructive pulmonary disease: Secondary | ICD-10-CM

## 2022-11-14 DIAGNOSIS — F424 Excoriation (skin-picking) disorder: Secondary | ICD-10-CM

## 2022-11-14 DIAGNOSIS — M205X9 Other deformities of toe(s) (acquired), unspecified foot: Secondary | ICD-10-CM | POA: Diagnosis not present

## 2022-11-14 DIAGNOSIS — F819 Developmental disorder of scholastic skills, unspecified: Secondary | ICD-10-CM

## 2022-11-14 DIAGNOSIS — G478 Other sleep disorders: Secondary | ICD-10-CM

## 2022-11-14 DIAGNOSIS — G40309 Generalized idiopathic epilepsy and epileptic syndromes, not intractable, without status epilepticus: Secondary | ICD-10-CM | POA: Diagnosis not present

## 2022-11-14 DIAGNOSIS — R635 Abnormal weight gain: Secondary | ICD-10-CM

## 2022-11-14 DIAGNOSIS — R269 Unspecified abnormalities of gait and mobility: Secondary | ICD-10-CM | POA: Diagnosis not present

## 2022-11-14 DIAGNOSIS — F840 Autistic disorder: Secondary | ICD-10-CM

## 2022-11-14 DIAGNOSIS — R296 Repeated falls: Secondary | ICD-10-CM

## 2022-11-14 DIAGNOSIS — G40209 Localization-related (focal) (partial) symptomatic epilepsy and epileptic syndromes with complex partial seizures, not intractable, without status epilepticus: Secondary | ICD-10-CM | POA: Diagnosis not present

## 2022-11-14 DIAGNOSIS — N390 Urinary tract infection, site not specified: Secondary | ICD-10-CM

## 2022-11-14 MED ORDER — DEPAKOTE 500 MG PO TBEC
DELAYED_RELEASE_TABLET | ORAL | 5 refills | Status: DC
Start: 2022-11-14 — End: 2023-05-01

## 2022-11-16 ENCOUNTER — Encounter (INDEPENDENT_AMBULATORY_CARE_PROVIDER_SITE_OTHER): Payer: Self-pay | Admitting: Family

## 2022-11-16 DIAGNOSIS — R635 Abnormal weight gain: Secondary | ICD-10-CM | POA: Insufficient documentation

## 2022-11-20 ENCOUNTER — Telehealth: Payer: Self-pay | Admitting: Internal Medicine

## 2022-11-20 NOTE — Telephone Encounter (Signed)
Patient's mother is calling wanting an appointment in September with Dr.Desai. there is no availability. Please call and advise. [phone]

## 2022-11-21 DIAGNOSIS — J4520 Mild intermittent asthma, uncomplicated: Secondary | ICD-10-CM | POA: Diagnosis not present

## 2022-11-21 NOTE — Telephone Encounter (Signed)
done

## 2022-11-21 NOTE — Telephone Encounter (Signed)
Expand All Collapse All                                                                   Patient's mother is calling wanting an appointment in September with Dr.Desai. there is no availability. Please call and advise. [phone]    FYI: patient is still not using CPAP yet Please advise as to when she will need to be seen?                                                   Lori [facility]                                          [id number]                                          1995-08-29   Primary Care Physician:Scifres, Peter Minium (Inactive) Date of Appointment: 07/22/2022 Established Patient Visit     Plan/Recommendations:   Continue the budesonide and aformoterol nebulizers daily.    Continue duoneb up to 4 times a day as needed. If the breathing gets worse, start taking the duoneb rescue treatments. If you're needing them every 4 hours call our office because you might need to be treated for an asthma flare.  Start using CPAP therapy with boiled or distilled water. Follow up with sleep doctor at [facility] as we discussed.   Continue the acid reflux medication - pepcid   Continue singulair for allergies.    The area in the nose is healing well - keep using the saline ayr gel as needed for dryness and irritation.      Return to Care: Return in about 3 months (around 10/22/2022).

## 2022-11-26 DIAGNOSIS — L700 Acne vulgaris: Secondary | ICD-10-CM | POA: Diagnosis not present

## 2022-11-26 DIAGNOSIS — N390 Urinary tract infection, site not specified: Secondary | ICD-10-CM | POA: Diagnosis not present

## 2022-11-26 DIAGNOSIS — L249 Irritant contact dermatitis, unspecified cause: Secondary | ICD-10-CM | POA: Diagnosis not present

## 2022-11-27 ENCOUNTER — Encounter: Payer: Self-pay | Admitting: Internal Medicine

## 2022-11-27 ENCOUNTER — Ambulatory Visit (INDEPENDENT_AMBULATORY_CARE_PROVIDER_SITE_OTHER): Payer: 59 | Admitting: Internal Medicine

## 2022-11-27 VITALS — BP 100/60 | HR 104 | Ht <= 58 in | Wt 178.0 lb

## 2022-11-27 DIAGNOSIS — K219 Gastro-esophageal reflux disease without esophagitis: Secondary | ICD-10-CM

## 2022-11-27 DIAGNOSIS — J4540 Moderate persistent asthma, uncomplicated: Secondary | ICD-10-CM | POA: Diagnosis not present

## 2022-11-27 DIAGNOSIS — J309 Allergic rhinitis, unspecified: Secondary | ICD-10-CM

## 2022-11-27 DIAGNOSIS — G4733 Obstructive sleep apnea (adult) (pediatric): Secondary | ICD-10-CM

## 2022-11-27 NOTE — Progress Notes (Signed)
Lori Miles    [id number]    Oct 26, 1995  Primary Care Physician:Scifres, Peter Minium (Inactive) Date of Appointment: 11/27/2022 Established Patient Visit  Chief complaint:   Chief Complaint  Patient presents with   Follow-up    Pt is here for OSA F/U visit.     HPI: Lori Miles is a 27 y.o. woman with autism, nonverbal and dyspnea and cough suspectd to be asthma with rhinitis and reflux.  Lori Miles has severe developmental delay, autism and seizure disorder. She has significant limitations with her ADLs and is full assist with daily caregivers/nurses.   Interval Updates: Here for follow up. Hasn't been using cpap machine. She says she never called for an appointment, she just wanted to know when she needed to be seen   Currently being treated for UTI. Has frequent UTIs.   Childhood asthma - current therapy budesonide and formoterol twice daily.  Takes prn duonebs - 1-2 times a day. No interval hospitalizations, no ED visits, no prednisone.   Also sees Dr. Lucie Leather for reflux and rhinitis.   Takes singulair,  Also takes ayr saline gel twice daily in the nose for dryness and irritation  I have reviewed the patient's family social and past medical history and updated as appropriate.   Past Medical History:  Diagnosis Date   Autism disorder    Bacteriuria    Chronic constipation    Cognitive developmental delay    Constipation    COVID-19 03/2020   Disruptive behavior disorder    Dyspnea    if she walks much   Family history of adverse reaction to anesthesia     "hives"-? if anesthesia did take antibiotic   Frequency-urgency syndrome    Gait disorder    ORGANIC PER NERUOLGIST NOTE (DR HICKLING)   Generalized convulsive epilepsy (HCC) NEUROLOGIST-  DR HICKLING   GERD (gastroesophageal reflux disease)    Gout    H/O sinus tachycardia    History of acute respiratory failure 02/14/2015   SECONDARY TO CAP   History of recurrent UTIs     Incontinent of feces    Interstitial cystitis    Intertrigo 11/29/2019   Language development disorder    answers yes and no only   Moderate intellectual disability    OSA (obstructive sleep apnea)    no machine yet    Partial epilepsy with impairment of consciousness (HCC)    FOLLOWED BY DR HICKLING   Pneumonia 2    4 times this year ( 2020)   PONV (postoperative nausea and vomiting)    Seizure (HCC)    04/26/22 over 20 years   Urinary incontinence     Past Surgical History:  Procedure Laterality Date   CYSTOSCOPY N/A 06/03/2017   Procedure: CYSTOSCOPY WITH EXAM UNDER ANESTHESIA;  Surgeon: Jerilee Field, MD;  [facility]: [facility];  Service: Urology;  Laterality: N/A;   CYSTOSCOPY  2020   CYSTOSCOPY WITH HYDRODISTENSION AND BIOPSY  04/14/2012   Procedure: CYSTOSCOPY/BIOPSY/HYDRODISTENSION;  Surgeon: Lindaann Slough, MD;  Location: [facility];  Service: Urology;;  instillation of marcaine and pyridium   EUA/  PAP SMEAR/  BREAST EXAM  03-12-2016   dr Erin Fulling [facility]   EUA/ VAGINOSCOPY/ COLPOSCOPY FO THE HYMENAL RING  10-04-2009    dr Tamela Oddi  [facility]   IR RADIOLOGIST EVAL & MGMT  07/29/2019   IR RADIOLOGIST EVAL & MGMT  09/30/2019   IR SCLEROTHERAPY OF A FLUID COLLECTION  09/08/2019   RADIOLOGY WITH ANESTHESIA N/A 09/08/2019   Procedure: IR Sclerotherapy Treatment;  Surgeon: Radiologist, Medication, MD;  Location: [facility];  Service: Radiology;  Laterality: N/A;   TONSILLECTOMY      Family History  Problem Relation Age of Onset   Seizures Mother    Seizures Sister        1 sister has   Pancreatic cancer Sister    Colon cancer Neg Hx    Colon polyps Neg Hx    Esophageal cancer Neg Hx    Rectal cancer Neg Hx    Stomach cancer Neg Hx     Social History   Occupational History   Not on file  Tobacco Use   Smoking status: Never   Smokeless tobacco: Never  Vaping Use   Vaping status: Never Used  Substance and Sexual Activity   Alcohol use:  No    Alcohol/week: 0.0 standard drinks of alcohol   Drug use: No   Sexual activity: Never    Birth control/protection: Pill     Physical Exam: Blood pressure 100/60, pulse (!) 104, height 4' 9.5" (1.461 m), weight 178 lb (80.7 kg), SpO2 97%.  Gen:      No acute distress, nonverbal, cooperative Lungs:   no respiratory distress, no increased wob CV:         tachycardic, regular   Data Reviewed: Imaging: I have personally reviewed the chest xray June 2023  CT sinus Jan 2022 - normal CT sinus  PFTs:      No data to display         I have personally reviewed the patient's PFTs and spirometry July 2022 difficult to interpret.    Labs: Lab Results  Component Value Date   NA 138 11/14/2021   K 3.4 (L) 11/14/2021   CO2 24 11/14/2021   GLUCOSE 109 (H) 11/14/2021   BUN 16 11/14/2021   CREATININE 1.06 (H) 11/14/2021   CALCIUM 7.9 (L) 11/14/2021   GFRNONAA >60 11/14/2021   Lab Results  Component Value Date   WBC 17.8 (H) 11/14/2021   HGB 12.6 11/14/2021   HCT 38.2 11/14/2021   MCV 94.1 11/14/2021   PLT 282 11/14/2021    Immunization status: Immunization History  Administered Date(s) Administered   DTaP 02/06/1995, 04/30/1995, 07/02/1995, 01/14/1996, 10/10/1998   HIB (PRP-T) 02/06/1995, 04/30/1995, 07/02/1995, 01/14/1996   Hepatitis B, PED/ADOLESCENT [birth year]03-1995, 02/06/1995, 01/14/1996   IPV 02/06/1995, 04/30/1995, 07/02/1995, 01/14/1996, 10/10/1998   MMR 01/14/1996, 10/10/1998   Pneumococcal Polysaccharide-23 08/06/2016, 03/07/2020   Tdap 03/03/2017   Varicella 01/14/1996    External Records Personally Reviewed: allergy, pulmonary, urology  Assessment:  Moderate persistent asthma, well controlled Rhinitis, chronic, controlled GERD, controlled OSA, not yet on cpap.    Plan/Recommendations:  Lori Miles needs to start using her CPAP.   Continue budesonide and formoterol twice daily.  Continue the duonebs as needed for asthma.   Continue singulair,  saline gel for dryness.   Continue pepcid for acid reflux.    Return to Care: Return in about 6 months (around 05/27/2023), or or as needed for sleep apnea.. I recommend she follow up after using cpap for a month.   Durel Salts, MD Pulmonary and Critical Care Medicine [facility] Office:[phone]

## 2022-11-27 NOTE — Patient Instructions (Addendum)
It was a pleasure to see you today!  Please schedule follow up scheduled with myself in 6 months.  If my schedule is not open yet, we will contact you with a reminder closer to that time. Please call [phone] if you haven't heard from [name] a month before, and always call us sooner if issues or concerns arise. You can also send [name] a message through MyChart, but but aware that this is not to be used for urgent issues and it may take up to 5-7 days to receive a reply. Please be aware that you will likely be able to view your results before I have a chance to respond to them. Please give [name] 5 business days to respond to any non-urgent results.  It was a pleasure to see you today!   Aloise needs to start using her CPAP.   Continue budesonide and formoterol twice daily.  Continue the duonebs as needed for asthma.   Continue singulair, saline gel for dryness.   Continue pepcid for acid reflux.

## 2022-12-03 ENCOUNTER — Ambulatory Visit: Payer: 59 | Admitting: Physical Therapy

## 2022-12-10 ENCOUNTER — Other Ambulatory Visit: Payer: Self-pay

## 2022-12-10 ENCOUNTER — Encounter: Payer: Self-pay | Admitting: Allergy and Immunology

## 2022-12-10 ENCOUNTER — Ambulatory Visit (INDEPENDENT_AMBULATORY_CARE_PROVIDER_SITE_OTHER): Payer: 59 | Admitting: Allergy and Immunology

## 2022-12-10 VITALS — BP 98/66 | HR 112 | Temp 98.4°F | Resp 18

## 2022-12-10 DIAGNOSIS — J3089 Other allergic rhinitis: Secondary | ICD-10-CM | POA: Diagnosis not present

## 2022-12-10 DIAGNOSIS — J4550 Severe persistent asthma, uncomplicated: Secondary | ICD-10-CM | POA: Diagnosis not present

## 2022-12-10 DIAGNOSIS — K219 Gastro-esophageal reflux disease without esophagitis: Secondary | ICD-10-CM

## 2022-12-10 MED ORDER — MONTELUKAST SODIUM 10 MG PO TABS: 10.0000 mg | ORAL_TABLET | Freq: Every evening | ORAL | 5 refills | Status: DC

## 2022-12-10 MED ORDER — CETIRIZINE HCL 10 MG PO TABS: 10.0000 mg | ORAL_TABLET | Freq: Two times a day (BID) | ORAL | 5 refills | Status: DC | PRN

## 2022-12-10 MED ORDER — FORMOTEROL FUMARATE 20 MCG/2ML IN NEBU: 20.0000 ug | INHALATION_SOLUTION | Freq: Two times a day (BID) | RESPIRATORY_TRACT | 3 refills | Status: DC

## 2022-12-10 MED ORDER — IPRATROPIUM-ALBUTEROL 0.5-2.5 (3) MG/3ML IN SOLN: 3.0000 mL | RESPIRATORY_TRACT | 2 refills | Status: DC | PRN

## 2022-12-10 MED ORDER — ALBUTEROL SULFATE (2.5 MG/3ML) 0.083% IN NEBU: 2.5000 mg | INHALATION_SOLUTION | RESPIRATORY_TRACT | 1 refills | Status: AC | PRN

## 2022-12-10 MED ORDER — PULMICORT 1 MG/2ML IN SUSP: 1.0000 mg | Freq: Four times a day (QID) | RESPIRATORY_TRACT | 2 refills | Status: DC

## 2022-12-10 NOTE — Progress Notes (Unsigned)
[place] - [place] - [place] - [place] - [place]   Follow-up Note  Referring Provider: No ref. provider found Primary Provider: Scifres, Peter Minium (Inactive) Date of Office Visit: 12/10/2022  Subjective:   Lori Miles (DOB: 06/24/1995) is a 27 y.o. female who [facility] in re-evaluation of the following:  HPI: Lori Miles returns to this clinic in evaluation of asthma, allergic rhinitis, reflux.  I last saw her in this clinic 02 April 2022.  She is done very well with her upper airways.  She has not required an antibiotic treating episode of sinusitis.  She has been using nasal gel on a pretty consistent basis and also continues on montelukast and cetirizine.  She has done very well with her asthma.  She has not required a systemic steroid to treat exacerbation.  She uses a short acting bronchodilator about twice a month while she continues on nebulized budesonide and formoterol.  Her reflux is under very good control on her current therapy.  Allergies as of 12/10/2022       Reactions   Abilify [aripiprazole] Swelling, Palpitations   Seroquel [quetiapine Fumarate] Palpitations   Amoxicillin-pot Clavulanate Hives, Other (See Comments)   Has patient had a PCN reaction causing immediate rash, facial/tongue/throat swelling, SOB or lightheadedness with hypotension: No Has patient had a PCN reaction causing severe rash involving mucus membranes or skin necrosis:    #  #  YES  #  #  Has patient had a PCN reaction that required hospitalization: No Has patient had a PCN reaction occurring within the last 10 years: No   Bactrim [sulfamethoxazole-trimethoprim] Other (See Comments)   Abdominal pain   Doxycycline Other (See Comments)   Stomach cramps   Macrobid [nitrofurantoin Macrocrystal] Other (See Comments)   Sharp pain/ given with Tylenol 325 mg   Keppra [levetiracetam] Other (See Comments)   Aggressive behavior          Medication List    acetaminophen 325 MG tablet Commonly known as: TYLENOL Take 2 tablets (650 mg total) by mouth every 6 (six) hours as needed for mild pain or moderate pain.   albuterol 108 (90 Base) MCG/ACT inhaler Commonly known as: VENTOLIN HFA Inhale 2 puffs into the lungs every 4 (four) hours as needed for wheezing or shortness of breath.   albuterol (2.5 MG/3ML) 0.083% nebulizer solution Commonly known as: PROVENTIL Take 3 mLs (2.5 mg total) by nebulization every 4 (four) hours as needed for wheezing or shortness of breath.   cefUROXime 500 MG tablet Commonly known as: CEFTIN Take 500 mg by mouth 2 (two) times daily.   cetirizine 10 MG tablet Commonly known as: ZYRTEC Take 1 tablet (10 mg total) by mouth 2 (two) times daily as needed for allergies (Can use an extra dose during flare ups).   Depakote 500 MG DR tablet Generic drug: divalproex TAKE 1 TABLET BY MOUTH IN THE MORNING AND 2 AT NIGHT   famotidine 20 MG tablet Commonly known as: PEPCID Take 1 tablet (20 mg total) by mouth 2 (two) times daily. TAKE 1 TABLET(20 MG) BY MOUTH EVERY 12 HOURS   FeroSul 325 (65 Fe) MG tablet Generic drug: ferrous sulfate Take 325 mg by mouth daily with breakfast.   fluconazole 150 MG tablet Commonly known as: DIFLUCAN Take 1 tablet (150 mg total) by mouth as needed (when on antibiotics).   formoterol 20 MCG/2ML nebulizer solution Commonly known as: Perforomist Take 2 mLs (20 mcg total)  by nebulization 2 (two) times daily. USE 1 VIAL VIA NEBULIZER TWICE DAILY   hydrOXYzine 25 MG tablet Commonly known as: ATARAX Take 25 mg by mouth 3 (three) times daily as needed (pain).   ipratropium-albuterol 0.5-2.5 (3) MG/3ML Soln Commonly known as: DUONEB Take 3 mLs by nebulization every 4 (four) hours as needed.   melatonin 3 MG Tabs tablet Take 2 tablets (6mg ) by mouth at bedtime.   montelukast 10 MG tablet Commonly known as: SINGULAIR Take 1 tablet (10 mg total) by mouth at  bedtime. TAKE 1 TABLET(10 MG) BY MOUTH AT BEDTIME   Nebulizer Misc Adult mask with tubing. Use as directed with nebulizer device.   ondansetron 4 MG disintegrating tablet Commonly known as: Zofran ODT Take 2 tablets (8 mg total) by mouth every 8 (eight) hours as needed for nausea or vomiting.   polyethylene glycol powder 17 GM/SCOOP powder Commonly known as: GLYCOLAX/MIRALAX DISSOLVE 1 CAPFUL(17 GRAMS) IN AT LEAST 8 OUNCES OF WATER/JUICE TWICE DAILY   Pulmicort 1 MG/2ML nebulizer solution Generic drug: budesonide Take 2 mLs (1 mg total) by nebulization in the morning, at noon, in the evening, and at bedtime. USE 1 VIAL VIA NEBULIZER FOUR TIMES DAILY DURING RESPITORY INFECTIONS AND ASTHMA   Seasonique 0.15-0.03 &0.01 MG tablet Generic drug: Levonorgestrel-Ethinyl Estradiol Take 1 tablet by mouth daily.   senna 8.6 MG tablet Commonly known as: SENOKOT Take 1 tablet by mouth at bedtime.   terconazole 0.8 % vaginal cream Commonly known as: TERAZOL 3 In pull-up alternate every other week with A&D   traZODone 50 MG tablet Commonly known as: DESYREL TAKE 2 TABLETS(100 MG) BY MOUTH AT BEDTIME   vitamin A & D ointment Apply 1 Application topically See admin instructions. Put In pull up every other week alternate with Terconazole   Vitamin D3 1.25 MG (50000 UT) Caps Take 1 capsule by mouth once a week.    Past Medical History:  Diagnosis Date   Asthma    Autism disorder    Bacteriuria    Chronic constipation    Cognitive developmental delay    Constipation    COVID-19 03/2020   Disruptive behavior disorder    Dyspnea    if she walks much   Family history of adverse reaction to anesthesia     "hives"-? if anesthesia did take antibiotic   Frequency-urgency syndrome    Gait disorder    ORGANIC PER NERUOLGIST NOTE (DR HICKLING)   Generalized convulsive epilepsy (HCC) NEUROLOGIST-  DR HICKLING   GERD (gastroesophageal reflux disease)    Gout    H/O sinus tachycardia     History of acute respiratory failure 02/14/2015   SECONDARY TO CAP   History of recurrent UTIs    Incontinent of feces    Interstitial cystitis    Intertrigo 11/29/2019   Language development disorder    answers yes and no only   Moderate intellectual disability    OSA (obstructive sleep apnea)    no machine yet    Partial epilepsy with impairment of consciousness (HCC)    FOLLOWED BY DR HICKLING   Pneumonia 2    4 times this year ( 2020)   PONV (postoperative nausea and vomiting)    Seizure (HCC)    04/26/22 over 20 years   Urinary incontinence     Past Surgical History:  Procedure Laterality Date   CYSTOSCOPY N/A 06/03/2017   Procedure: CYSTOSCOPY WITH EXAM UNDER ANESTHESIA;  Surgeon: Jerilee Field, MD;  Location: [facility];  Service: Urology;  Laterality: N/A;   CYSTOSCOPY  2020   CYSTOSCOPY WITH HYDRODISTENSION AND BIOPSY  04/14/2012   Procedure: CYSTOSCOPY/BIOPSY/HYDRODISTENSION;  Surgeon: Lindaann Slough, MD;  Location: [facility];  Service: Urology;;  instillation of marcaine and pyridium   EUA/  PAP SMEAR/  BREAST EXAM  03-12-2016   dr Erin Fulling [facility]   EUA/ VAGINOSCOPY/ COLPOSCOPY FO THE HYMENAL RING  10-04-2009    dr Tamela Oddi  [facility]   IR RADIOLOGIST EVAL & MGMT  07/29/2019   IR RADIOLOGIST EVAL & MGMT  09/30/2019   IR SCLEROTHERAPY OF A FLUID COLLECTION  09/08/2019   RADIOLOGY WITH ANESTHESIA N/A 09/08/2019   Procedure: IR Sclerotherapy Treatment;  Surgeon: Radiologist, Medication, MD;  Location: [facility];  Service: Radiology;  Laterality: N/A;   TONSILLECTOMY      Review of systems negative except as noted in HPI / PMHx or noted below:  Review of Systems  Constitutional: Negative.   HENT: Negative.    Eyes: Negative.   Respiratory: Negative.    Cardiovascular: Negative.   Gastrointestinal: Negative.   Genitourinary: Negative.   Musculoskeletal: Negative.   Skin: Negative.   Neurological: Negative.   Endo/Heme/Allergies:  Negative.   Psychiatric/Behavioral: Negative.       Objective:   Vitals:   12/10/22 0959  BP: 98/66  Pulse: (!) 112  Resp: 18  Temp: 98.4 F (36.9 C)  SpO2: 96%          Physical Exam Constitutional:      Appearance: She is not diaphoretic.  HENT:     Head: Normocephalic.     Right Ear: Tympanic membrane, ear canal and external ear normal.     Left Ear: Tympanic membrane, ear canal and external ear normal.     Nose: Nose normal. No mucosal edema or rhinorrhea.     Mouth/Throat:     Pharynx: Uvula midline. No oropharyngeal exudate.  Eyes:     Conjunctiva/sclera: Conjunctivae normal.  Neck:     Thyroid: No thyromegaly.     Trachea: Trachea normal. No tracheal tenderness or tracheal deviation.  Cardiovascular:     Rate and Rhythm: Normal rate and regular rhythm.     Heart sounds: Normal heart sounds, S1 normal and S2 normal. No murmur heard. Pulmonary:     Effort: No respiratory distress.     Breath sounds: Normal breath sounds. No stridor. No wheezing or rales.  Lymphadenopathy:     Head:     Right side of head: No tonsillar adenopathy.     Left side of head: No tonsillar adenopathy.     Cervical: No cervical adenopathy.  Skin:    Findings: No erythema or rash.     Nails: There is no clubbing.  Neurological:     Mental Status: She is alert.     Diagnostics:    Spirometry was performed and demonstrated an FEV1 of *** at *** % of predicted.  The patient had an Asthma Control Test with the following results: ACT Total Score: 20.    Assessment and Plan:   1. Asthma, severe persistent, well-controlled   2. Other allergic rhinitis   3. Gastroesophageal reflux disease, unspecified whether esophagitis present    1. Continue a combination of the following:   A. budesonide 1 mg nebulized twice a day  B. Perforomist nebulized twice a day  C. Nasal saline / gel 2 times per day  D. montelukast 10 mg daily  E. cetirizine 10 mg daily  2.  Continue to Treat  reflux with a combination of the following:   A. Famotidine 40mg  twice a day  B. No caffeine or chocolate consumption   3. "Action plan" for asthma flare up:   A. Increase budesonide to 4 times a day  B. Albuterol or DUONEB nebulization every 4-6 hours if needed  4. If needed:   A. Cetirizine 10 mg - 1 tablet 1 time per day  5. Return to clinic 6 months or earlier if problem.   6. Obtain fall flu vaccine  Lori Miles appears to be quite stable regarding her multiorgan atopic disease and her reflux on her current plan of using anti-inflammatory agents for airway and an H2 receptor blocker on a consistent basis.  This has been a very good 63-month interval not requiring the administration of systemic steroids antibiotics.  I will see her back in this clinic in 6 months or earlier if there is a problem.   Laurette Schimke, MD Allergy / Immunology [facility]

## 2022-12-10 NOTE — Patient Instructions (Addendum)
  1. Continue a combination of the following:   A. budesonide 1 mg nebulized twice a day  B. Perforomist nebulized twice a day  C. Nasal saline / gel 2 times per day  D. montelukast 10 mg daily  E. cetirizine 10 mg daily  2. Continue to Treat reflux with a combination of the following:   A. Famotidine 40mg  twice a day  B. No caffeine or chocolate consumption   3. "Action plan" for asthma flare up:   A. Increase budesonide to 4 times a day  B. Albuterol or DUONEB nebulization every 4-6 hours if needed  4. If needed:   A. Cetirizine 10 mg - 1 tablet 1 time per day  5. Return to clinic 6 months or earlier if problem.   6. Obtain fall flu vaccine

## 2022-12-11 ENCOUNTER — Encounter: Payer: Self-pay | Admitting: Allergy and Immunology

## 2022-12-12 DIAGNOSIS — N390 Urinary tract infection, site not specified: Secondary | ICD-10-CM | POA: Diagnosis not present

## 2022-12-23 DIAGNOSIS — J4520 Mild intermittent asthma, uncomplicated: Secondary | ICD-10-CM | POA: Diagnosis not present

## 2022-12-31 DIAGNOSIS — N390 Urinary tract infection, site not specified: Secondary | ICD-10-CM | POA: Diagnosis not present

## 2023-01-06 ENCOUNTER — Encounter: Payer: Self-pay | Admitting: Physical Therapy

## 2023-01-06 ENCOUNTER — Ambulatory Visit: Payer: 59 | Attending: Family | Admitting: Physical Therapy

## 2023-01-06 DIAGNOSIS — R531 Weakness: Secondary | ICD-10-CM | POA: Insufficient documentation

## 2023-01-06 DIAGNOSIS — R269 Unspecified abnormalities of gait and mobility: Secondary | ICD-10-CM | POA: Insufficient documentation

## 2023-01-06 DIAGNOSIS — R2689 Other abnormalities of gait and mobility: Secondary | ICD-10-CM

## 2023-01-06 DIAGNOSIS — M205X9 Other deformities of toe(s) (acquired), unspecified foot: Secondary | ICD-10-CM | POA: Insufficient documentation

## 2023-01-06 DIAGNOSIS — M6281 Muscle weakness (generalized): Secondary | ICD-10-CM | POA: Diagnosis not present

## 2023-01-06 DIAGNOSIS — R296 Repeated falls: Secondary | ICD-10-CM | POA: Insufficient documentation

## 2023-01-06 DIAGNOSIS — R2681 Unsteadiness on feet: Secondary | ICD-10-CM

## 2023-01-06 NOTE — Therapy (Signed)
OUTPATIENT PHYSICAL THERAPY NEURO EVALUATION   Patient Name: Lori Miles MRN: [id number] DOB:02-02-1996, 27 y.o., female Today's Date: 01/06/2023   PCP: Karlyne Greenspan REFERRING PROVIDER: Elveria Rising, NP    PT End of Session - 01/06/23 1914     Visit Number 1    Number of Visits 7   eval + 6 sessions   Date for PT Re-Evaluation 02/21/23   pushed out due to scheduling   Authorization Type [facility] Medicare    Authorization Time Period 01-06-23 - 03-18-23    PT Start Time 0758    PT Stop Time 0844    PT Time Calculation (min) 46 min    Activity Tolerance Patient limited by pain;Other (comment)   pt reported discomfort in legs, sitting frequently during eval - needed coercion at times to participate in evaluation   Behavior During Therapy [facility] for tasks assessed/performed               Past Medical History:  Diagnosis Date   Asthma    Autism disorder    Bacteriuria    Chronic constipation    Cognitive developmental delay    Constipation    COVID-19 03/2020   Disruptive behavior disorder    Dyspnea    if she walks much   Family history of adverse reaction to anesthesia     "hives"-? if anesthesia did take antibiotic   Frequency-urgency syndrome    Gait disorder    ORGANIC PER NERUOLGIST NOTE (DR HICKLING)   Generalized convulsive epilepsy (HCC) NEUROLOGIST-  DR HICKLING   GERD (gastroesophageal reflux disease)    Gout    H/O sinus tachycardia    History of acute respiratory failure 02/14/2015   SECONDARY TO CAP   History of recurrent UTIs    Incontinent of feces    Interstitial cystitis    Intertrigo 11/29/2019   Language development disorder    answers yes and no only   Moderate intellectual disability    OSA (obstructive sleep apnea)    no machine yet    Partial epilepsy with impairment of consciousness (HCC)    FOLLOWED BY DR HICKLING   Pneumonia 2    4 times this year ( 2020)   PONV (postoperative nausea and vomiting)    Seizure  (HCC)    04/26/22 over 20 years   Urinary incontinence    Past Surgical History:  Procedure Laterality Date   CYSTOSCOPY N/A 06/03/2017   Procedure: CYSTOSCOPY WITH EXAM UNDER ANESTHESIA;  Surgeon: Jerilee Field, MD;  Location: [facility];  Service: Urology;  Laterality: N/A;   CYSTOSCOPY  2020   CYSTOSCOPY WITH HYDRODISTENSION AND BIOPSY  04/14/2012   Procedure: CYSTOSCOPY/BIOPSY/HYDRODISTENSION;  Surgeon: Lindaann Slough, MD;  Location:  S[facility]ERY [facility];  Service: Urology;;  instillation of marcaine and pyridium   EUA/  PAP SMEAR/  BREAST EXAM  03-12-2016   dr Erin Fulling [facility]   EUA/ VAGINOSCOPY/ COLPOSCOPY FO THE HYMENAL RING  10-04-2009    dr Tamela Oddi  [facility]   IR RADIOLOGIST EVAL & MGMT  07/29/2019   IR RADIOLOGIST EVAL & MGMT  09/30/2019   IR SCLEROTHERAPY OF A FLUID COLLECTION  09/08/2019   RADIOLOGY WITH ANESTHESIA N/A 09/08/2019   Procedure: IR Sclerotherapy Treatment;  Surgeon: Radiologist, Medication, MD;  Location: [facility] OR;  Service: Radiology;  Laterality: N/A;   TONSILLECTOMY     Patient Active Problem List   Diagnosis Date Noted   Weight gain 11/16/2022   Compulsive skin picking 11/14/2022  Poor balance 05/12/2022   Autism 04/30/2022   Well woman exam 04/30/2022   Moderate persistent asthma, uncomplicated 10/09/2021   Vitamin D deficiency 10/09/2021   Encounter for long-term (current) use of high-risk medication 12/29/2020   Menorrhagia 12/16/2020   UTI symptoms 04/10/2020   Renal cyst 11/29/2019   Intertrigo 11/29/2019   Complex care coordination 05/18/2019   Chronic bladder pain 03/03/2019   Screening for cervical cancer 02/26/2019   Glucosuria 06/01/2018   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   Not well controlled moderate persistent asthma 04/29/2018   Gastroesophageal reflux disease 04/29/2018   Recurrent infections 04/29/2018   Dysuria 02/20/2018   Chronic rhinitis 04/03/2017   Recurrent falls 03/21/2017   Weakness  03/21/2017   Venous insufficiency 03/21/2017   OSA on CPAP 09/30/2016   Excessive daytime sleepiness 03/05/2016   Insomnia 10/04/2015   Acute recurrent pansinusitis 07/11/2015   Bronchitis 05/05/2015   Abnormality of gait 03/22/2015   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   Cough variant asthma vs UACS/ vcd  03/15/2015   Pneumonia 02/14/2015   Autism spectrum disorder 02/14/2015   Nausea with vomiting 02/14/2015   Obesity, morbid (HCC) 12/13/2014   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 12/13/2014   Insomnia due to mental disorder 12/13/2014   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   Acanthosis nigricans 11/14/2014   Toeing-in 08/29/2014   Generalized convulsive epilepsy (HCC) 09/28/2012   Partial epilepsy with impairment of consciousness (HCC) 09/28/2012   Cognitive developmental delay 09/28/2012   Recurrent urinary tract infection 08/01/2011   COPD with asthma (HCC) 08/01/2011   Chronic constipation 08/01/2011   Contraception 08/01/2011   Encopresis with constipation and overflow incontinence 02/15/2011   Functional urinary incontinence 02/02/2010   Other convulsions 09/05/2009    ONSET DATE: Referral date 11-14-22  REFERRING DIAG:  F81.9 (ICD-10-CM) - Cognitive developmental delay M20.5X9 (ICD-10-CM) - In-toeing, unspecified laterality E66.01 (ICD-10-CM) - Obesity, morbid F84.0 (ICD-10-CM) - Autism spectrum disorder R26.9 (ICD-10-CM) - Abnormality of gait R29.6 (ICD-10-CM) - Recurrent falls R53.1 (ICD-10-CM) - Weakness  THERAPY DIAG:  Other abnormalities of gait and mobility  Muscle weakness (generalized)  Unsteadiness on feet  Rationale for Evaluation and Treatment Rehabilitation  SUBJECTIVE:                                                                                                                                                                                              SUBJECTIVE STATEMENT: [name] states pt is having more  pain in her feet and swelling in her feet; mother reports pt using rollator or wheelchair for prolonged distances - says they are able to  catch her before she falls; pt points to Lt knee and expresses discomfort/pain.  Mother reports pt has UTI at this time Pt accompanied by:  mother, [name]  PERTINENT HISTORY: Autism spectrum disorder, h/o generalized convulsive epilepsy, steroid induced diabetes, h/o seizures, moderate intellectual disability  PAIN:  Are you having pain? Yes -  pt rates pain 10/10 but unsure if pt able to cognitively understand rating scale and rate accurately -points to her Rt knee and left foot ; mother states she has swelling in both legs; pt is not consistently ambulating with an antalgic gait pattern  PRECAUTIONS: None  WEIGHT BEARING RESTRICTIONS No  FALLS: Has patient fallen in last 6 months? No  LIVING ENVIRONMENT: Lives with: lives with their family Lives in: House/apartment Stairs: No Has following equipment at home: Environmental consultant - 4 wheeled, Wheelchair (manual), and Tour manager  PLOF: Independent with household mobility with device, Needs assistance with ADLs, and Needs assistance with homemaking  Needs assistance with community ambulation (supervision needed due to decreased cognition)  PATIENT GOALS increase activity to increase strength; improve balance and endurance  OBJECTIVE:   DIAGNOSTIC FINDINGS: N/A  COGNITION: Overall cognitive status: Impaired - developmental delay   SENSATION: WFL  COORDINATION: WFL's bil. LE's   POSTURE: rounded shoulders and forward head  LOWER EXTREMITY ROM:   WFL's bil. LE's   LOWER EXTREMITY MMT:  accurate assessment is questionable due to decreased cognition and decreased effort exhibited during testing  MMT Right Eval Left Eval  Hip flexion 3+ 3+  Hip extension    Hip abduction    Hip adduction    Hip internal rotation    Hip external rotation    Knee flexion 3+ 3+  Knee extension 4 3+  Ankle  dorsiflexion 4 4-  Ankle plantarflexion    Ankle inversion    Ankle eversion    (Blank rows = not tested)  Pt reports pain/discomfort with resistance with MMT  BED MOBILITY:  Independent  TRANSFERS: Assistive device utilized: None  Sit to stand: SBA Stand to sit: SBA   STAIRS:  Level of Assistance:  SBA  Stair Negotiation Technique: Step to Pattern Alternating Pattern  with Bilateral Rails  Number of Stairs: 4   Height of Stairs: 6  Comments: pt ascended & descended steps using step over step sequence using rails  GAIT: Gait pattern: step through pattern; LLE internally rotated with increased supination noted in Lt foot in stance Distance walked: 115' with no device x 2 reps (rest break between reps) Assistive device utilized: None Level of assistance: SBA Comments: see above Gait velocity:  15.64 secs = 2.10 ft/sec without device  FUNCTIONAL TESTs:   5 times sit to stand: 33.68 secs from high/low mat table without UE support Timed up and go (TUG):    17.69 secs without device  Pt unable to attempt SLS on RLE due to c/o shaking/discomfort; able to stand on LLE for approx. 2 secs with UE support Pt able to pick up object off floor and return to standing without LOB - gel bottle  Pt stood with feet together on floor for 30 secs with SBA     PATIENT EDUCATION: Education details: eval results with POC of 6 weeks Person educated: Parent Education method: Explanation Education comprehension: verbalized understanding   HOME EXERCISE PROGRAM: To be issued    GOALS: Goals reviewed with patient? Yes  SHORT TERM GOALS: Target date: 01/31/2023 (3 weeks)  Amb. 350' (3 laps in gym) nonstop without device with SBA  to demo increased endurance. Baseline: 115' x 2 reps with SBA Goal status: INITIAL  2.  Improve TUG score to </= 15 secs without device to decrease fall risk.  Baseline: 17.69 secs  Goal status: INITIAL  3.  Perform HEP for balance and strengthening with  mother's assist.  Baseline:  Goal status: INITIAL   LONG TERM GOALS: Target date: 02/21/2023 - 6 weeks  Pt will amb. 465' without device on flat, even surface without LOB with SBA. Baseline: 115' x 2 reps Goal status: INITIAL  2.  Perform at least 10" on recumbent bike nonstop for improved endurance/activity.  Baseline:  Goal status: INITIAL  3.  Improve TUG score to </= 13.5 secs without device to decrease fall risk.  Baseline: 17.69 secs without device Goal status: INITIAL  4.  Pt will perform sit to stand 5 times without use of UE support from mat table in </= 20 secs to demo improved LE strength. Baseline:  33.68 secs with UE support intermittently Goal status: INITIAL  5.  Provide updated HEP as appropriate to be performed with mother's assist. Baseline:  Goal status: INITIAL  ASSESSMENT:  CLINICAL IMPRESSION: Patient is a 27 y.o. female  who was seen today for physical therapy evaluation and treatment for gait abnormality.  PMH includes autism, developmental delay and h/o seizures/epilepsy.  Pt exhibits abnormal gait pattern with LLE internally rotated in stance, decreased push off of LLE in stance due to weakness in Lt plantarflexors.  Pt also demonstrates decreased balance with inability to perform SLS on either leg and TUG score 17.69 secs, indicative of fall risk.  Pt reporting bil. LE pain intermittently in today's eval session, however, did not consistently ambulate with antalgic gait pattern.   Pt will benefit from PT to address gait & balance deficits and LE weakness.      OBJECTIVE IMPAIRMENTS Abnormal gait, decreased activity tolerance, decreased balance, decreased cognition, decreased endurance, and decreased strength.   ACTIVITY LIMITATIONS carrying, lifting, bending, squatting, stairs, and locomotion level  PARTICIPATION LIMITATIONS: meal prep, cleaning, laundry, and shopping  PERSONAL FACTORS Behavior pattern, Fitness, Past/current experiences, Time since  onset of injury/illness/exacerbation, and 1 comorbidity: Behavior patterns/cognition  are also affecting patient's functional outcome.   REHAB POTENTIAL: Good for goals established  CLINICAL DECISION MAKING: Stable/uncomplicated  EVALUATION COMPLEXITY: Low  PLAN: PT FREQUENCY: 1x/week   PT DURATION: 6 weeks + eval   PLANNED INTERVENTIONS: Therapeutic exercises, Therapeutic activity, Neuromuscular re-education, Balance training, Gait training, Patient/Family education, Self Care, and Stair training  PLAN FOR NEXT SESSION: LE strengthening and balance training   Lori Miles, Donavan Burnet, PT 01/06/2023, 7:19 PM

## 2023-01-14 ENCOUNTER — Ambulatory Visit: Payer: 59 | Admitting: Physical Therapy

## 2023-01-14 DIAGNOSIS — M6281 Muscle weakness (generalized): Secondary | ICD-10-CM | POA: Diagnosis not present

## 2023-01-14 DIAGNOSIS — R269 Unspecified abnormalities of gait and mobility: Secondary | ICD-10-CM | POA: Diagnosis not present

## 2023-01-14 DIAGNOSIS — R2689 Other abnormalities of gait and mobility: Secondary | ICD-10-CM

## 2023-01-14 DIAGNOSIS — F5101 Primary insomnia: Secondary | ICD-10-CM | POA: Diagnosis not present

## 2023-01-14 DIAGNOSIS — R2681 Unsteadiness on feet: Secondary | ICD-10-CM

## 2023-01-14 DIAGNOSIS — R296 Repeated falls: Secondary | ICD-10-CM | POA: Diagnosis not present

## 2023-01-14 DIAGNOSIS — M205X9 Other deformities of toe(s) (acquired), unspecified foot: Secondary | ICD-10-CM | POA: Diagnosis not present

## 2023-01-14 DIAGNOSIS — D500 Iron deficiency anemia secondary to blood loss (chronic): Secondary | ICD-10-CM | POA: Diagnosis not present

## 2023-01-14 DIAGNOSIS — R531 Weakness: Secondary | ICD-10-CM | POA: Diagnosis not present

## 2023-01-14 DIAGNOSIS — J4550 Severe persistent asthma, uncomplicated: Secondary | ICD-10-CM | POA: Diagnosis not present

## 2023-01-14 DIAGNOSIS — Z789 Other specified health status: Secondary | ICD-10-CM | POA: Diagnosis not present

## 2023-01-14 DIAGNOSIS — K219 Gastro-esophageal reflux disease without esophagitis: Secondary | ICD-10-CM | POA: Diagnosis not present

## 2023-01-14 DIAGNOSIS — N390 Urinary tract infection, site not specified: Secondary | ICD-10-CM | POA: Diagnosis not present

## 2023-01-14 DIAGNOSIS — Z9989 Dependence on other enabling machines and devices: Secondary | ICD-10-CM | POA: Diagnosis not present

## 2023-01-14 NOTE — Therapy (Unsigned)
OUTPATIENT PHYSICAL THERAPY NEURO TREATMENT NOTE   Patient Name: Lori Miles MRN: [id number] DOB:April 20, 1995, 27 y.o., female Today's Date: 01/15/2023   PCP: Lori Miles REFERRING PROVIDER: Elveria Rising, NP    PT End of Session - 01/15/23 1703     Visit Number 2    Number of Visits 7   eval + 6 sessions   Date for PT Re-Evaluation 02/21/23   pushed out due to scheduling   Authorization Type [facility] Medicare    Authorization Time Period 01-06-23 - 03-18-23    PT Start Time 0848    PT Stop Time 0932    PT Time Calculation (min) 44 min    Activity Tolerance Patient limited by pain;Other (comment)   pt reported discomfort in legs, sitting frequently during eval - needed coercion at times to participate in evaluation   Behavior During Therapy [facility] for tasks assessed/performed                Past Medical History:  Diagnosis Date   Asthma    Autism disorder    Bacteriuria    Chronic constipation    Cognitive developmental delay    Constipation    COVID-19 03/2020   Disruptive behavior disorder    Dyspnea    if she walks much   Family history of adverse reaction to anesthesia     "hives"-? if anesthesia did take antibiotic   Frequency-urgency syndrome    Gait disorder    ORGANIC PER NERUOLGIST NOTE (Lori Miles)   Generalized convulsive epilepsy (HCC) NEUROLOGIST-  Lori Miles   GERD (gastroesophageal reflux disease)    Gout    H/O sinus tachycardia    History of acute respiratory failure 02/14/2015   SECONDARY TO CAP   History of recurrent UTIs    Incontinent of feces    Interstitial cystitis    Intertrigo 11/29/2019   Language development disorder    answers yes and no only   Moderate intellectual disability    OSA (obstructive sleep apnea)    no machine yet    Partial epilepsy with impairment of consciousness (HCC)    FOLLOWED BY Lori Miles   Pneumonia 2    4 times this year ( 2020)   PONV (postoperative nausea and vomiting)     Seizure (HCC)    04/26/22 over 20 years   Urinary incontinence    Past Surgical History:  Procedure Laterality Date   CYSTOSCOPY N/A 06/03/2017   Procedure: CYSTOSCOPY WITH EXAM UNDER ANESTHESIA;  Surgeon: Lori Field, Miles;  Location: [facility];  Service: Urology;  Laterality: N/A;   CYSTOSCOPY  2020   CYSTOSCOPY WITH HYDRODISTENSION AND BIOPSY  04/14/2012   Procedure: CYSTOSCOPY/BIOPSY/HYDRODISTENSION;  Surgeon: Lori Slough, Miles;  Location: [facility];  Service: Urology;;  instillation of marcaine and pyridium   EUA/  PAP SMEAR/  BREAST EXAM  03-12-2016   Lori Lori Miles [facility]   EUA/ VAGINOSCOPY/ COLPOSCOPY FO THE HYMENAL RING  10-04-2009    Lori Lori Miles  [facility]   IR RADIOLOGIST EVAL & MGMT  07/29/2019   IR RADIOLOGIST EVAL & MGMT  09/30/2019   IR SCLEROTHERAPY OF A FLUID COLLECTION  09/08/2019   RADIOLOGY WITH ANESTHESIA N/A 09/08/2019   Procedure: IR Sclerotherapy Treatment;  Surgeon: Lori Miles;  Location: [facility];  Service: Radiology;  Laterality: N/A;   TONSILLECTOMY     Patient Active Problem List   Diagnosis Date Noted   Weight gain 11/16/2022   Compulsive skin  picking 11/14/2022   Poor balance 05/12/2022   Autism 04/30/2022   Well woman exam 04/30/2022   Moderate persistent asthma, uncomplicated 10/09/2021   Vitamin D deficiency 10/09/2021   Encounter for long-term (current) use of high-risk medication 12/29/2020   Menorrhagia 12/16/2020   UTI symptoms 04/10/2020   Renal cyst 11/29/2019   Intertrigo 11/29/2019   Complex care coordination 05/18/2019   Chronic bladder pain 03/03/2019   Screening for cervical cancer 02/26/2019   Glucosuria 06/01/2018   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   Not well controlled moderate persistent asthma 04/29/2018   Gastroesophageal reflux disease 04/29/2018   Recurrent infections 04/29/2018   Dysuria 02/20/2018   Chronic rhinitis 04/03/2017   Recurrent falls 03/21/2017   Weakness  03/21/2017   Venous insufficiency 03/21/2017   OSA on CPAP 09/30/2016   Excessive daytime sleepiness 03/05/2016   Insomnia 10/04/2015   Acute recurrent pansinusitis 07/11/2015   Bronchitis 05/05/2015   Abnormality of gait 03/22/2015   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   Cough variant asthma vs UACS/ vcd  03/15/2015   Pneumonia 02/14/2015   Autism spectrum disorder 02/14/2015   Nausea with vomiting 02/14/2015   Obesity, morbid (HCC) 12/13/2014   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 12/13/2014   Insomnia due to mental disorder 12/13/2014   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   Acanthosis nigricans 11/14/2014   Toeing-in 08/29/2014   Generalized convulsive epilepsy (HCC) 09/28/2012   Partial epilepsy with impairment of consciousness (HCC) 09/28/2012   Cognitive developmental delay 09/28/2012   Recurrent urinary tract infection 08/01/2011   COPD with asthma (HCC) 08/01/2011   Chronic constipation 08/01/2011   Contraception 08/01/2011   Encopresis with constipation and overflow incontinence 02/15/2011   Functional urinary incontinence 02/02/2010   Other convulsions 09/05/2009    ONSET DATE: Referral date 11-14-22  REFERRING DIAG:  F81.9 (ICD-10-CM) - Cognitive developmental delay M20.5X9 (ICD-10-CM) - In-toeing, unspecified laterality E66.01 (ICD-10-CM) - Obesity, morbid F84.0 (ICD-10-CM) - Autism spectrum disorder R26.9 (ICD-10-CM) - Abnormality of gait R29.6 (ICD-10-CM) - Recurrent falls R53.1 (ICD-10-CM) - Weakness  THERAPY DIAG:  Other abnormalities of gait and mobility  Muscle weakness (generalized)  Unsteadiness on feet  Rationale for Evaluation and Treatment Rehabilitation  SUBJECTIVE:                                                                                                                                                                                              SUBJECTIVE STATEMENT: [name] reports that pt did have a  UTI and is now on antibiotics; no other changes or problems reported Pt accompanied by:  mother, Lori Miles  PERTINENT  HISTORY: Autism spectrum disorder, h/o generalized convulsive epilepsy, steroid induced diabetes, h/o seizures, moderate intellectual disability  PAIN:  Are you having pain? Yes -  pt rates pain 10/10 but unsure if pt able to cognitively understand rating scale and rate accurately -points to her Rt knee and left foot ; mother states she has swelling in both legs; pt is not consistently ambulating with an antalgic gait pattern  PRECAUTIONS: None  WEIGHT BEARING RESTRICTIONS No  FALLS: Has patient fallen in last 6 months? No  LIVING ENVIRONMENT: Lives with: lives with their family Lives in: House/apartment Stairs: No Has following equipment at home: Environmental consultant - 4 wheeled, Wheelchair (manual), and Tour manager  PLOF: Independent with household mobility with device, Needs assistance with ADLs, and Needs assistance with homemaking  Needs assistance with community ambulation (supervision needed due to decreased cognition)  PATIENT GOALS increase activity to increase strength; improve balance and endurance  OBJECTIVE:   DIAGNOSTIC FINDINGS: N/A  COGNITION: Overall cognitive status: Impaired - developmental delay   SENSATION: WFL  COORDINATION: WFL's bil. LE's   POSTURE: rounded shoulders and forward head  LOWER EXTREMITY ROM:   WFL's bil. LE's   LOWER EXTREMITY MMT:  accurate assessment is questionable due to decreased cognition and decreased effort exhibited during testing  MMT Right Eval Left Eval  Hip flexion 3+ 3+  Hip extension    Hip abduction    Hip adduction    Hip internal rotation    Hip external rotation    Knee flexion 3+ 3+  Knee extension 4 3+  Ankle dorsiflexion 4 4-  Ankle plantarflexion    Ankle inversion    Ankle eversion    (Blank rows = not tested)  Pt reports pain/discomfort with resistance with MMT  BED MOBILITY:   Independent  TRANSFERS: Assistive device utilized: None  Sit to stand: SBA Stand to sit: SBA   STAIRS:  Level of Assistance:  SBA  Stair Negotiation Technique: Step to Pattern Alternating Pattern  with Bilateral Rails  Number of Stairs: 4   Height of Stairs: 6  Comments: pt ascended & descended steps using step over step sequence using rails  GAIT: Gait pattern: step through pattern; LLE internally rotated with increased supination noted in Lt foot in stance Distance walked: 115' with no device x 2 reps (rest break between reps) Assistive device utilized: None Level of assistance: SBA Comments: see above Gait velocity:  15.64 secs = 2.10 ft/sec without device  FUNCTIONAL TESTs:   5 times sit to stand: 33.68 secs from high/low mat table without UE support Timed up and go (TUG):    17.69 secs without device  Pt unable to attempt SLS on RLE due to c/o shaking/discomfort; able to stand on LLE for approx. 2 secs with UE support Pt able to pick up object off floor and return to standing without LOB - gel bottle  Pt stood with feet together on floor for 30 secs with SBA     PATIENT EDUCATION: Education details: eval results with POC of 6 weeks Person educated: Parent Education method: Explanation Education comprehension: verbalized understanding   HOME EXERCISE PROGRAM: To be issued    Today's Treatment:  01-14-23  Gait:Gait pattern: step through pattern; LLE internally rotated with increased supination noted in Lt foot in stance Distance walked: 200' x 1, 400' x 1 (rest break between reps) Assistive device utilized: None Level of assistance: SBA  Step training - 4 steps x 3 reps using bil. Hand rails - step  over step sequence with ascension and descension  TherEx: SciFit Level 3.5 x 5" with bil. Ue's and LE's  Leg press bil. LE's 40# 10 reps x 3 sets   NeuroRe-ed: Pt picked up 5 cones from table to floor and then back up onto computer table - standing on red  mat for compliant surface training  Rockerboard 10 reps x 2 sets inside // bars with UE support  Stepping over black balance beam - 3# weight on each leg - 10 reps each with CGA  Pt performed standing/stepping on trampoline tossing red medicine ball (2kg) to/from PT approx. 3' away for compliant surface training       GOALS: Goals reviewed with patient? Yes  SHORT TERM GOALS: Target date: 01/31/2023 (3 weeks)  Amb. 350' (3 laps in gym) nonstop without device with SBA to demo increased endurance. Baseline: 115' x 2 reps with SBA Goal status: INITIAL  2.  Improve TUG score to </= 15 secs without device to decrease fall risk.  Baseline: 17.69 secs  Goal status: INITIAL  3.  Perform HEP for balance and strengthening with mother's assist.  Baseline:  Goal status: INITIAL   LONG TERM GOALS: Target date: 02/21/2023 - 6 weeks  Pt will amb. 465' without device on flat, even surface without LOB with SBA. Baseline: 115' x 2 reps Goal status: INITIAL  2.  Perform at least 10" on recumbent bike nonstop for improved endurance/activity.  Baseline:  Goal status: INITIAL  3.  Improve TUG score to </= 13.5 secs without device to decrease fall risk.  Baseline: 17.69 secs without device Goal status: INITIAL  4.  Pt will perform sit to stand 5 times without use of UE support from mat table in </= 20 secs to demo improved LE strength. Baseline:  33.68 secs with UE support intermittently Goal status: INITIAL  5.  Provide updated HEP as appropriate to be performed with mother's assist. Baseline:  Goal status: INITIAL  ASSESSMENT:  CLINICAL IMPRESSION: PT session focused on balance training on compliant surfaces and on LE strengthening.  Pt tolerated exercises well without c/o pain in either LE and with few seated rest breaks.  Cont with POC.     OBJECTIVE IMPAIRMENTS Abnormal gait, decreased activity tolerance, decreased balance, decreased cognition, decreased endurance, and  decreased strength.   ACTIVITY LIMITATIONS carrying, lifting, bending, squatting, stairs, and locomotion level  PARTICIPATION LIMITATIONS: meal prep, cleaning, laundry, and shopping  PERSONAL FACTORS Behavior pattern, Fitness, Past/current experiences, Time since onset of injury/illness/exacerbation, and 1 comorbidity: Behavior patterns/cognition  are also affecting patient's functional outcome.   REHAB POTENTIAL: Good for goals established  CLINICAL DECISION MAKING: Stable/uncomplicated  EVALUATION COMPLEXITY: Low  PLAN: PT FREQUENCY: 1x/week   PT DURATION: 6 weeks + eval   PLANNED INTERVENTIONS: Therapeutic exercises, Therapeutic activity, Neuromuscular re-education, Balance training, Gait training, Patient/Family education, Self Care, and Stair training  PLAN FOR NEXT SESSION: LE strengthening and balance training   Anne Sebring, Donavan Burnet, PT 01/15/2023, 5:15 PM

## 2023-01-15 ENCOUNTER — Encounter: Payer: Self-pay | Admitting: Physical Therapy

## 2023-01-23 ENCOUNTER — Encounter: Payer: Self-pay | Admitting: Physical Therapy

## 2023-01-23 ENCOUNTER — Ambulatory Visit: Payer: 59 | Attending: Family | Admitting: Physical Therapy

## 2023-01-23 DIAGNOSIS — R2681 Unsteadiness on feet: Secondary | ICD-10-CM | POA: Insufficient documentation

## 2023-01-23 DIAGNOSIS — M6281 Muscle weakness (generalized): Secondary | ICD-10-CM | POA: Insufficient documentation

## 2023-01-23 DIAGNOSIS — R2689 Other abnormalities of gait and mobility: Secondary | ICD-10-CM | POA: Diagnosis not present

## 2023-01-23 NOTE — Therapy (Signed)
OUTPATIENT PHYSICAL THERAPY NEURO TREATMENT NOTE   Patient Name: Lori Miles MRN: [id number] DOB:05/07/1995, 27 y.o., female Today's Date: 01/23/2023   PCP: Karlyne Greenspan REFERRING PROVIDER: Elveria Rising, NP    PT End of Session - 01/23/23 1045     Visit Number 3    Number of Visits 7   eval + 6 sessions   Date for PT Re-Evaluation 02/21/23   pushed out due to scheduling   Authorization Type [facility] Medicare    Authorization Time Period 01-06-23 - 03-18-23    PT Start Time 0756    PT Stop Time 0845    PT Time Calculation (min) 49 min    Activity Tolerance Patient tolerated treatment well    Behavior During Therapy [facility] for tasks assessed/performed                 Past Medical History:  Diagnosis Date   Asthma    Autism disorder    Bacteriuria    Chronic constipation    Cognitive developmental delay    Constipation    COVID-19 03/2020   Disruptive behavior disorder    Dyspnea    if she walks much   Family history of adverse reaction to anesthesia     "hives"-? if anesthesia did take antibiotic   Frequency-urgency syndrome    Gait disorder    ORGANIC PER NERUOLGIST NOTE (DR HICKLING)   Generalized convulsive epilepsy (HCC) NEUROLOGIST-  DR HICKLING   GERD (gastroesophageal reflux disease)    Gout    H/O sinus tachycardia    History of acute respiratory failure 02/14/2015   SECONDARY TO CAP   History of recurrent UTIs    Incontinent of feces    Interstitial cystitis    Intertrigo 11/29/2019   Language development disorder    answers yes and no only   Moderate intellectual disability    OSA (obstructive sleep apnea)    no machine yet    Partial epilepsy with impairment of consciousness (HCC)    FOLLOWED BY DR HICKLING   Pneumonia 2    4 times this year ( 2020)   PONV (postoperative nausea and vomiting)    Seizure (HCC)    04/26/22 over 20 years   Urinary incontinence    Past Surgical History:  Procedure Laterality Date   CYSTOSCOPY  N/A 06/03/2017   Procedure: CYSTOSCOPY WITH EXAM UNDER ANESTHESIA;  Surgeon: Jerilee Field, MD;  Location: [facility];  Service: Urology;  Laterality: N/A;   CYSTOSCOPY  2020   CYSTOSCOPY WITH HYDRODISTENSION AND BIOPSY  04/14/2012   Procedure: CYSTOSCOPY/BIOPSY/HYDRODISTENSION;  Surgeon: Lindaann Slough, MD;  Location: [facility];  Service: Urology;;  instillation of marcaine and pyridium   EUA/  PAP SMEAR/  BREAST EXAM  03-12-2016   dr Erin Fulling [facility]   EUA/ VAGINOSCOPY/ COLPOSCOPY FO THE HYMENAL RING  10-04-2009    dr Tamela Oddi  [facility]   IR RADIOLOGIST EVAL & MGMT  07/29/2019   IR RADIOLOGIST EVAL & MGMT  09/30/2019   IR SCLEROTHERAPY OF A FLUID COLLECTION  09/08/2019   RADIOLOGY WITH ANESTHESIA N/A 09/08/2019   Procedure: IR Sclerotherapy Treatment;  Surgeon: Radiologist, Medication, MD;  Location: [facility] OR;  Service: Radiology;  Laterality: N/A;   TONSILLECTOMY     Patient Active Problem List   Diagnosis Date Noted   Weight gain 11/16/2022   Compulsive skin picking 11/14/2022   Poor balance 05/12/2022   Autism 04/30/2022   Well woman exam 04/30/2022  Moderate persistent asthma, uncomplicated 10/09/2021   Vitamin D deficiency 10/09/2021   Encounter for long-term (current) use of high-risk medication 12/29/2020   Menorrhagia 12/16/2020   UTI symptoms 04/10/2020   Renal cyst 11/29/2019   Intertrigo 11/29/2019   Complex care coordination 05/18/2019   Chronic bladder pain 03/03/2019   Screening for cervical cancer 02/26/2019   Glucosuria 06/01/2018   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   Not well controlled moderate persistent asthma 04/29/2018   Gastroesophageal reflux disease 04/29/2018   Recurrent infections 04/29/2018   Dysuria 02/20/2018   Chronic rhinitis 04/03/2017   Recurrent falls 03/21/2017   Weakness 03/21/2017   Venous insufficiency 03/21/2017   OSA on CPAP 09/30/2016   Excessive daytime sleepiness 03/05/2016   Insomnia  10/04/2015   Acute recurrent pansinusitis 07/11/2015   Bronchitis 05/05/2015   Abnormality of gait 03/22/2015   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   Cough variant asthma vs UACS/ vcd  03/15/2015   Pneumonia 02/14/2015   Autism spectrum disorder 02/14/2015   Nausea with vomiting 02/14/2015   Obesity, morbid (HCC) 12/13/2014   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 12/13/2014   Insomnia due to mental disorder 12/13/2014   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   Acanthosis nigricans 11/14/2014   Toeing-in 08/29/2014   Generalized convulsive epilepsy (HCC) 09/28/2012   Partial epilepsy with impairment of consciousness (HCC) 09/28/2012   Cognitive developmental delay 09/28/2012   Recurrent urinary tract infection 08/01/2011   COPD with asthma (HCC) 08/01/2011   Chronic constipation 08/01/2011   Contraception 08/01/2011   Encopresis with constipation and overflow incontinence 02/15/2011   Functional urinary incontinence 02/02/2010   Other convulsions 09/05/2009    ONSET DATE: Referral date 11-14-22  REFERRING DIAG:  F81.9 (ICD-10-CM) - Cognitive developmental delay M20.5X9 (ICD-10-CM) - In-toeing, unspecified laterality E66.01 (ICD-10-CM) - Obesity, morbid F84.0 (ICD-10-CM) - Autism spectrum disorder R26.9 (ICD-10-CM) - Abnormality of gait R29.6 (ICD-10-CM) - Recurrent falls R53.1 (ICD-10-CM) - Weakness  THERAPY DIAG:  Other abnormalities of gait and mobility  Muscle weakness (generalized)  Unsteadiness on feet  Rationale for Evaluation and Treatment Rehabilitation  SUBJECTIVE:                                                                                                                                                                                              SUBJECTIVE STATEMENT: [name] reports that pt still has UTI - mother states pt was bleeding this morning but it stopped -thought they were going to be late to PT appt.  Pt accompanied  by:  mother, [name]  PERTINENT HISTORY: Autism spectrum disorder, h/o generalized convulsive epilepsy, steroid  induced diabetes, h/o seizures, moderate intellectual disability  PAIN:  Are you having pain? Yes -  pt rates pain 10/10 but unsure if pt able to cognitively understand rating scale and rate accurately -points to her Rt knee and left foot ; mother states she has swelling in both legs; pt is not consistently ambulating with an antalgic gait pattern  PRECAUTIONS: None  WEIGHT BEARING RESTRICTIONS No  FALLS: Has patient fallen in last 6 months? No  LIVING ENVIRONMENT: Lives with: lives with their family Lives in: House/apartment Stairs: No Has following equipment at home: Environmental consultant - 4 wheeled, Wheelchair (manual), and Tour manager  PLOF: Independent with household mobility with device, Needs assistance with ADLs, and Needs assistance with homemaking  Needs assistance with community ambulation (supervision needed due to decreased cognition)  PATIENT GOALS increase activity to increase strength; improve balance and endurance  OBJECTIVE:   DIAGNOSTIC FINDINGS: N/A  COGNITION: Overall cognitive status: Impaired - developmental delay   SENSATION: WFL  COORDINATION: WFL's bil. LE's   POSTURE: rounded shoulders and forward head  LOWER EXTREMITY ROM:   WFL's bil. LE's   LOWER EXTREMITY MMT:  accurate assessment is questionable due to decreased cognition and decreased effort exhibited during testing  MMT Right Eval Left Eval  Hip flexion 3+ 3+  Hip extension    Hip abduction    Hip adduction    Hip internal rotation    Hip external rotation    Knee flexion 3+ 3+  Knee extension 4 3+  Ankle dorsiflexion 4 4-  Ankle plantarflexion    Ankle inversion    Ankle eversion    (Blank rows = not tested)  Pt reports pain/discomfort with resistance with MMT  BED MOBILITY:  Independent  TRANSFERS: Assistive device utilized: None  Sit to stand: SBA Stand to sit:  SBA   STAIRS:  Level of Assistance:  SBA  Stair Negotiation Technique: Step to Pattern Alternating Pattern  with Bilateral Rails  Number of Stairs: 4   Height of Stairs: 6  Comments: pt ascended & descended steps using step over step sequence using rails  GAIT: Gait pattern: step through pattern; LLE internally rotated with increased supination noted in Lt foot in stance Distance walked: 115' with no device x 2 reps (rest break between reps) Assistive device utilized: None Level of assistance: SBA Comments: see above Gait velocity:  15.64 secs = 2.10 ft/sec without device  FUNCTIONAL TESTs:   5 times sit to stand: 33.68 secs from high/low mat table without UE support Timed up and go (TUG):    17.69 secs without device  Pt unable to attempt SLS on RLE due to c/o shaking/discomfort; able to stand on LLE for approx. 2 secs with UE support Pt able to pick up object off floor and return to standing without LOB - gel bottle  Pt stood with feet together on floor for 30 secs with SBA     PATIENT EDUCATION: Education details: eval results with POC of 6 weeks Person educated: Parent Education method: Explanation Education comprehension: verbalized understanding   HOME EXERCISE PROGRAM: To be issued    Today's Treatment:  01-23-23  Gait:Gait pattern: step through pattern; LLE internally rotated with increased supination noted in Lt foot in stance Distance walked: 350' (3 laps) x 1: 115' x 1 with CGA   Assistive device utilized: None Level of assistance: SBA  Step training - 4 steps x 3 reps using bil. Hand rails - step over step sequence with ascension and descension  TherEx: SciFit Level 3.1 x 4" with bil. Ue's and LE's; level 2.0 x 1" for final minute due to pt c/o discomfort in Lt knee  Leg press bil. LE's 40# 10 reps x 3 sets  LAQ's 3# weight 10 reps  Seated - hip flexion RLE & LLE with 3# weight   NeuroRe-ed: Pt stood on incline (ramp) - performed alternating  stepping forward and backward 10 reps with each leg with CGA  Rockerboard 10 reps x 2 sets inside // bars with UE support  Stepping over black balance beam - 3# weight on each leg - 10 reps each with CGA  Pt performed standing/stepping on trampoline tossing red medicine ball (2kg) to/from PT approx. 2' away for compliant surface training; pt performed small range jumping on trampoline with LUE support on bar and with Rt HHA from PT - 5 reps - stopped after 5th rep due to pt's c/o Rt knee discomfort  Pt performed sit to stand 5 reps from high/low mat table with feet on Airex for compliant surface training  Marching on Airex 10 reps with UE support on // bars   Stepping down to floor from Airex inside // bars - 5 reps with RLE and 5 reps with LLE with UE support on bars with CGA     GOALS: Goals reviewed with patient? Yes  SHORT TERM GOALS: Target date: 01/31/2023 (3 weeks)  Amb. 350' (3 laps in gym) nonstop without device with SBA to demo increased endurance. Baseline: 115' x 2 reps with SBA Goal status: INITIAL  2.  Improve TUG score to </= 15 secs without device to decrease fall risk.  Baseline: 17.69 secs  Goal status: INITIAL  3.  Perform HEP for balance and strengthening with mother's assist.  Baseline:  Goal status: INITIAL   LONG TERM GOALS: Target date: 02/21/2023 - 6 weeks  Pt will amb. 465' without device on flat, even surface without LOB with SBA. Baseline: 115' x 2 reps Goal status: INITIAL  2.  Perform at least 10" on recumbent bike nonstop for improved endurance/activity.  Baseline:  Goal status: INITIAL  3.  Improve TUG score to </= 13.5 secs without device to decrease fall risk.  Baseline: 17.69 secs without device Goal status: INITIAL  4.  Pt will perform sit to stand 5 times without use of UE support from mat table in </= 20 secs to demo improved LE strength. Baseline:  33.68 secs with UE support intermittently Goal status: INITIAL  5.  Provide  updated HEP as appropriate to be performed with mother's assist. Baseline:  Goal status: INITIAL  ASSESSMENT:  CLINICAL IMPRESSION: PT session focused on LE strengthening exercises with use of 3# weight for PRE's.  Pt expressed Rt knee discomfort and had some mild instability of Rt knee after completion of leg press exercise.  Pt able to perform LAQ exercise with 3# weight on RLE in seated position and then reported Rt knee felt better.  Pt did well with gait training with no knee instability exhibited.   Cont with POC.     OBJECTIVE IMPAIRMENTS Abnormal gait, decreased activity tolerance, decreased balance, decreased cognition, decreased endurance, and decreased strength.   ACTIVITY LIMITATIONS carrying, lifting, bending, squatting, stairs, and locomotion level  PARTICIPATION LIMITATIONS: meal prep, cleaning, laundry, and shopping  PERSONAL FACTORS Behavior pattern, Fitness, Past/current experiences, Time since onset of injury/illness/exacerbation, and 1 comorbidity: Behavior patterns/cognition  are also affecting patient's functional outcome.   REHAB POTENTIAL: Good for goals established  CLINICAL  DECISION MAKING: Stable/uncomplicated  EVALUATION COMPLEXITY: Low  PLAN: PT FREQUENCY: 1x/week   PT DURATION: 6 weeks + eval   PLANNED INTERVENTIONS: Therapeutic exercises, Therapeutic activity, Neuromuscular re-education, Balance training, Gait training, Patient/Family education, Self Care, and Stair training  PLAN FOR NEXT SESSION: LE strengthening and balance training   Mayzee Reichenbach, Donavan Burnet, PT 01/23/2023, 10:47 AM

## 2023-01-24 ENCOUNTER — Encounter: Payer: Self-pay | Admitting: Internal Medicine

## 2023-01-24 ENCOUNTER — Other Ambulatory Visit: Payer: Self-pay

## 2023-01-24 ENCOUNTER — Ambulatory Visit: Payer: 59 | Admitting: Internal Medicine

## 2023-01-24 ENCOUNTER — Ambulatory Visit (INDEPENDENT_AMBULATORY_CARE_PROVIDER_SITE_OTHER): Payer: 59 | Admitting: Internal Medicine

## 2023-01-24 ENCOUNTER — Telehealth: Payer: Self-pay | Admitting: Internal Medicine

## 2023-01-24 VITALS — BP 102/60 | HR 85 | Ht <= 58 in | Wt 178.0 lb

## 2023-01-24 DIAGNOSIS — K5909 Other constipation: Secondary | ICD-10-CM | POA: Diagnosis not present

## 2023-01-24 DIAGNOSIS — K219 Gastro-esophageal reflux disease without esophagitis: Secondary | ICD-10-CM | POA: Diagnosis not present

## 2023-01-24 MED ORDER — POLYETHYLENE GLYCOL 3350 17 GM/SCOOP PO POWD: ORAL | 2 refills | Status: AC

## 2023-01-24 MED ORDER — FAMOTIDINE 20 MG PO TABS: 20.0000 mg | ORAL_TABLET | Freq: Two times a day (BID) | ORAL | 3 refills | Status: DC

## 2023-01-24 MED ORDER — FAMOTIDINE 20 MG PO TABS: ORAL_TABLET | ORAL | 6 refills | Status: DC

## 2023-01-24 NOTE — Telephone Encounter (Signed)
Patient mother called regard medication Famotidine 20 mg and stated that if it is possible to take the medicine 2x in the morning and 1x-2x at night. And for it to be sent over to [facility]. Please advise.

## 2023-01-24 NOTE — Telephone Encounter (Signed)
Ok

## 2023-01-24 NOTE — Telephone Encounter (Signed)
Previous pepcid 20mg  script was for 2 tabs each am. Pts mother requesting a prescription be sent in for 2 tabs in the am and 1-2 tabs in the pm. Please advise.

## 2023-01-24 NOTE — Patient Instructions (Signed)
We have sent the following medications to your pharmacy for you to pick up at your convenience: Miralax daily and famotidine 20 mg twice daily.   _______________________________________________________  If your blood pressure at your visit was 140/90 or greater, please contact your primary care physician to follow up on this.  _______________________________________________________  If you are age 28 or older, your body mass index should be between 23-30. Your Body mass index is 37.85 kg/m. If this is out of the aforementioned range listed, please consider follow up with your Primary Care Provider.  If you are age 73 or younger, your body mass index should be between 19-25. Your Body mass index is 37.85 kg/m. If this is out of the aformentioned range listed, please consider follow up with your Primary Care Provider.   ________________________________________________________  The [facility] providers would like to encourage you to use [facility] to communicate with providers for non-urgent requests or questions.  Due to long hold times on the telephone, sending your provider a message by [facility] may be a faster and more efficient way to get a response.  Please allow 48 business hours for a response.  Please remember that this is for non-urgent requests.  _______________________________________________________

## 2023-01-24 NOTE — Progress Notes (Signed)
   Subjective:    Patient ID: Lori Miles, female    DOB: 10-Mar-1996, 27 y.o.   MRN: [id number]  HPI Lori Miles is a 27 year old female with a history of GERD, chronic constipation associated with occasional minor rectal bleeding due to hemorrhoids, seizure disorder, history of recurrent UTI, sleep apnea and autism who is here for follow-up.  She is here today with her mother.  She came for colonoscopy to evaluate rectal bleeding on 02/26/2022.  This was normal.  Small hemorrhoids were seen.  Overall she has been doing well.  She is continue MiraLAX daily and 2 senna at bedtime.  With this her bowel movements have been regular.  Her mother is trying to increase her overall water intake.  Her reflux and heartburn has been well-controlled on famotidine 20 mg twice daily.  She has not had issues with nausea and vomiting of late.  She continues to follow-up with neurology for her seizure disorder.   Review of Systems As per HPI, otherwise negative  Current Medications, Allergies, Past Medical History, Past Surgical History, Family History and Social History were reviewed in [employer] record.    Objective:   Physical Exam BP 102/60 (BP Location: Left Arm, Patient Position: Sitting, Cuff Size: Normal)   Pulse 85   Ht 4' 9.5" (1.461 m)   Wt 178 lb (80.7 kg)   SpO2 100%   BMI 37.85 kg/m  Gen: awake, alert, NAD HEENT: anicteric  Abd: soft, NT/ND, +BS throughout Ext: no c/c/e Neuro: nonfocal     Assessment & Plan:   27 year old female with a history of GERD, chronic constipation associated with occasional minor rectal bleeding due to hemorrhoids, seizure disorder, history of recurrent UTI, sleep apnea and autism who is here for follow-up.  Constipation with hemorrhoidal bleeding --bleeding controlled now that she is using MiraLAX 17 g daily and senna --Continue MiraLAX 17 g daily by prescription --Continue senna 2 to 3 tablets at bedtime  2.  GERD  with history of chronic cough --currently controlled on famotidine continue 20 mg twice daily  3.  Occasional nausea --less of an issue of late.  If recurrent she can continue ondansetron 4 mg every 6-8 hours as needed  Follow-up in 1 to 2 years, sooner if needed  20 minutes total spent today including patient facing time, coordination of care, reviewing medical history/procedures/pertinent radiology studies, and documentation of the encounter.

## 2023-01-24 NOTE — Telephone Encounter (Signed)
Script sent in as requested.

## 2023-01-28 ENCOUNTER — Ambulatory Visit: Payer: 59 | Admitting: Physical Therapy

## 2023-01-28 DIAGNOSIS — R2681 Unsteadiness on feet: Secondary | ICD-10-CM | POA: Diagnosis not present

## 2023-01-28 DIAGNOSIS — R2689 Other abnormalities of gait and mobility: Secondary | ICD-10-CM

## 2023-01-28 DIAGNOSIS — M6281 Muscle weakness (generalized): Secondary | ICD-10-CM | POA: Diagnosis not present

## 2023-01-28 NOTE — Therapy (Unsigned)
OUTPATIENT PHYSICAL THERAPY NEURO TREATMENT NOTE   Patient Name: Lori Miles MRN: [id number] DOB:01-13-1996, 27 y.o., female Today's Date: 01/29/2023   PCP: Karlyne Greenspan REFERRING PROVIDER: Elveria Rising, NP    PT End of Session - 01/29/23 1711     Visit Number 4    Number of Visits 7   eval + 6 sessions   Date for PT Re-Evaluation 02/21/23   pushed out due to scheduling   Authorization Type [facility] Medicare    Authorization Time Period 01-06-23 - 03-18-23    PT Start Time 0759    PT Stop Time 0845    PT Time Calculation (min) 46 min    Activity Tolerance Patient tolerated treatment well    Behavior During Therapy [facility] for tasks assessed/performed                  Past Medical History:  Diagnosis Date   Asthma    Autism disorder    Bacteriuria    Chronic constipation    Cognitive developmental delay    Constipation    COVID-19 03/2020   Disruptive behavior disorder    Dyspnea    if she walks much   Family history of adverse reaction to anesthesia     "hives"-? if anesthesia did take antibiotic   Frequency-urgency syndrome    Gait disorder    ORGANIC PER NERUOLGIST NOTE (DR HICKLING)   Generalized convulsive epilepsy (HCC) NEUROLOGIST-  DR HICKLING   GERD (gastroesophageal reflux disease)    Gout    H/O sinus tachycardia    History of acute respiratory failure 02/14/2015   SECONDARY TO CAP   History of recurrent UTIs    Incontinent of feces    Interstitial cystitis    Intertrigo 11/29/2019   Language development disorder    answers yes and no only   Moderate intellectual disability    OSA (obstructive sleep apnea)    no machine yet    Partial epilepsy with impairment of consciousness (HCC)    FOLLOWED BY DR HICKLING   Pneumonia 2    4 times this year ( 2020)   PONV (postoperative nausea and vomiting)    Seizure (HCC)    04/26/22 over 20 years   Urinary incontinence    Past Surgical History:  Procedure Laterality Date    CYSTOSCOPY N/A 06/03/2017   Procedure: CYSTOSCOPY WITH EXAM UNDER ANESTHESIA;  Surgeon: Jerilee Field, MD;  Location: [facility];  Service: Urology;  Laterality: N/A;   CYSTOSCOPY  2020   CYSTOSCOPY WITH HYDRODISTENSION AND BIOPSY  04/14/2012   Procedure: CYSTOSCOPY/BIOPSY/HYDRODISTENSION;  Surgeon: Lindaann Slough, MD;  Location: [facility];  Service: Urology;;  instillation of marcaine and pyridium   EUA/  PAP SMEAR/  BREAST EXAM  03-12-2016   dr Erin Fulling [facility]   EUA/ VAGINOSCOPY/ COLPOSCOPY FO THE HYMENAL RING  10-04-2009    dr Tamela Oddi  [facility]   IR RADIOLOGIST EVAL & MGMT  07/29/2019   IR RADIOLOGIST EVAL & MGMT  09/30/2019   IR SCLEROTHERAPY OF A FLUID COLLECTION  09/08/2019   RADIOLOGY WITH ANESTHESIA N/A 09/08/2019   Procedure: IR Sclerotherapy Treatment;  Surgeon: Radiologist, Medication, MD;  Location: [facility] OR;  Service: Radiology;  Laterality: N/A;   TONSILLECTOMY     Patient Active Problem List   Diagnosis Date Noted   Weight gain 11/16/2022   Compulsive skin picking 11/14/2022   Poor balance 05/12/2022   Autism 04/30/2022   Well woman exam 04/30/2022  Moderate persistent asthma, uncomplicated 10/09/2021   Vitamin D deficiency 10/09/2021   Encounter for long-term (current) use of high-risk medication 12/29/2020   Menorrhagia 12/16/2020   UTI symptoms 04/10/2020   Renal cyst 11/29/2019   Intertrigo 11/29/2019   Complex care coordination 05/18/2019   Chronic bladder pain 03/03/2019   Screening for cervical cancer 02/26/2019   Glucosuria 06/01/2018   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   Not well controlled moderate persistent asthma 04/29/2018   Gastroesophageal reflux disease 04/29/2018   Recurrent infections 04/29/2018   Dysuria 02/20/2018   Chronic rhinitis 04/03/2017   Recurrent falls 03/21/2017   Weakness 03/21/2017   Venous insufficiency 03/21/2017   OSA on CPAP 09/30/2016   Excessive daytime sleepiness 03/05/2016    Insomnia 10/04/2015   Acute recurrent pansinusitis 07/11/2015   Bronchitis 05/05/2015   Abnormality of gait 03/22/2015   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   Cough variant asthma vs UACS/ vcd  03/15/2015   Pneumonia 02/14/2015   Autism spectrum disorder 02/14/2015   Nausea with vomiting 02/14/2015   Obesity, morbid (HCC) 12/13/2014   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 12/13/2014   Insomnia due to mental disorder 12/13/2014   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   Acanthosis nigricans 11/14/2014   Toeing-in 08/29/2014   Generalized convulsive epilepsy (HCC) 09/28/2012   Partial epilepsy with impairment of consciousness (HCC) 09/28/2012   Cognitive developmental delay 09/28/2012   Recurrent urinary tract infection 08/01/2011   COPD with asthma (HCC) 08/01/2011   Chronic constipation 08/01/2011   Contraception 08/01/2011   Encopresis with constipation and overflow incontinence 02/15/2011   Functional urinary incontinence 02/02/2010   Other convulsions 09/05/2009    ONSET DATE: Referral date 11-14-22  REFERRING DIAG:  F81.9 (ICD-10-CM) - Cognitive developmental delay M20.5X9 (ICD-10-CM) - In-toeing, unspecified laterality E66.01 (ICD-10-CM) - Obesity, morbid F84.0 (ICD-10-CM) - Autism spectrum disorder R26.9 (ICD-10-CM) - Abnormality of gait R29.6 (ICD-10-CM) - Recurrent falls R53.1 (ICD-10-CM) - Weakness  THERAPY DIAG:  Other abnormalities of gait and mobility  Muscle weakness (generalized)  Unsteadiness on feet  Rationale for Evaluation and Treatment Rehabilitation  SUBJECTIVE:                                                                                                                                                                                              SUBJECTIVE STATEMENT: [name] reports that pt still has UTI; no other new problems or changes going on  Pt accompanied by:  mother, [name]  PERTINENT HISTORY: Autism spectrum  disorder, h/o generalized convulsive epilepsy, steroid induced diabetes, h/o seizures, moderate intellectual disability  PAIN:  Are you having  pain? Yes -  pt rates pain 10/10 but unsure if pt able to cognitively understand rating scale and rate accurately -points to her Rt knee and left foot ; mother states she has swelling in both legs; pt is not consistently ambulating with an antalgic gait pattern  PRECAUTIONS: None  WEIGHT BEARING RESTRICTIONS No  FALLS: Has patient fallen in last 6 months? No  LIVING ENVIRONMENT: Lives with: lives with their family Lives in: House/apartment Stairs: No Has following equipment at home: Environmental consultant - 4 wheeled, Wheelchair (manual), and Tour manager  PLOF: Independent with household mobility with device, Needs assistance with ADLs, and Needs assistance with homemaking  Needs assistance with community ambulation (supervision needed due to decreased cognition)  PATIENT GOALS increase activity to increase strength; improve balance and endurance  OBJECTIVE:   DIAGNOSTIC FINDINGS: N/A  COGNITION: Overall cognitive status: Impaired - developmental delay   SENSATION: WFL  COORDINATION: WFL's bil. LE's   POSTURE: rounded shoulders and forward head  LOWER EXTREMITY ROM:   WFL's bil. LE's   LOWER EXTREMITY MMT:  accurate assessment is questionable due to decreased cognition and decreased effort exhibited during testing  MMT Right Eval Left Eval  Hip flexion 3+ 3+  Hip extension    Hip abduction    Hip adduction    Hip internal rotation    Hip external rotation    Knee flexion 3+ 3+  Knee extension 4 3+  Ankle dorsiflexion 4 4-  Ankle plantarflexion    Ankle inversion    Ankle eversion    (Blank rows = not tested)  Pt reports pain/discomfort with resistance with MMT  BED MOBILITY:  Independent  TRANSFERS: Assistive device utilized: None  Sit to stand: SBA Stand to sit: SBA   STAIRS:  Level of Assistance:  SBA  Stair  Negotiation Technique: Step to Pattern Alternating Pattern  with Bilateral Rails  Number of Stairs: 4   Height of Stairs: 6  Comments: pt ascended & descended steps using step over step sequence using rails  GAIT: Gait pattern: step through pattern; LLE internally rotated with increased supination noted in Lt foot in stance Distance walked: 35' with no device x 3 reps (no rest break between reps) Assistive device utilized: None Level of assistance: SBA Comments: see above Gait velocity:  15.64 secs = 2.10 ft/sec without device  FUNCTIONAL TESTs:   5 times sit to stand: 33.68 secs from high/low mat table without UE support Timed up and go (TUG):    17.69 secs without device  Pt unable to attempt SLS on RLE due to c/o shaking/discomfort; able to stand on LLE for approx. 2 secs with UE support Pt able to pick up object off floor and return to standing without LOB - gel bottle  Pt stood with feet together on floor for 30 secs with SBA     PATIENT EDUCATION: Education details: eval results with POC of 6 weeks Person educated: Parent Education method: Explanation Education comprehension: verbalized understanding   HOME EXERCISE PROGRAM: To be issued    Today's Treatment:  01-28-23  Gait:Gait pattern: step through pattern; LLE internally rotated with increased supination noted in Lt foot in stance Distance walked: 350' (3 laps) x 1: 115' x 1 with CGA   Assistive device utilized: None Level of assistance: SBA  Step training - 4 steps x 3 reps using bil. Hand rails - step over step sequence with ascension and descension  TherEx: NuStep Level 3 x 5" with bil. Ue's and LE's  Leg press bil. LE's 50# 10 reps x 3 sets  Sit to stand from mat 5 reps - feet on floor - no UE support  Standing hip extension and hip abduction with 3# weight 10 reps each direction each leg - UE support used on bar   NeuroRe-ed: Pt transferred to mat on floor - tall kneeling position - tossed ball  approx. 5 reps, then c/o knee pain so this activity was discontinued  Rockerboard 10 reps x 2 sets inside // bars with UE support  Stepping over black balance beam - 10 reps each with CGA   Obstacle course - stepping stones of various heights (6) used for improved SLS and 2 balance bubbles to use as target - pt negotiated obstacle course 4 times with HHA from PT  Marching on Airex 10 reps with UE support on // bars   Stepping down to floor from Airex inside // bars - 5 reps with RLE and 5 reps with LLE with UE support on bars with CGA     GOALS: Goals reviewed with patient? Yes  SHORT TERM GOALS: Target date: 01/31/2023 (3 weeks)  Amb. 350' (3 laps in gym) nonstop without device with SBA to demo increased endurance. Baseline: 115' x 2 reps with SBA Goal status: INITIAL  2.  Improve TUG score to </= 15 secs without device to decrease fall risk.  Baseline: 17.69 secs  Goal status: INITIAL  3.  Perform HEP for balance and strengthening with mother's assist.  Baseline:  Goal status: INITIAL   LONG TERM GOALS: Target date: 02/21/2023 - 6 weeks  Pt will amb. 465' without device on flat, even surface without LOB with SBA. Baseline: 115' x 2 reps Goal status: INITIAL  2.  Perform at least 10" on recumbent bike nonstop for improved endurance/activity.  Baseline:  Goal status: INITIAL  3.  Improve TUG score to </= 13.5 secs without device to decrease fall risk.  Baseline: 17.69 secs without device Goal status: INITIAL  4.  Pt will perform sit to stand 5 times without use of UE support from mat table in </= 20 secs to demo improved LE strength. Baseline:  33.68 secs with UE support intermittently Goal status: INITIAL  5.  Provide updated HEP as appropriate to be performed with mother's assist. Baseline:  Goal status: INITIAL  ASSESSMENT:  CLINICAL IMPRESSION: PT session focused on LE strengthening exercises, gait training without device and balance training to improve SLS  on each leg.  Pt c/o intermittent Lt knee pain during therapy but was able to take short rest period and continue with exercises.  Pt was unable to tolerate tall kneeling position due to knee discomfort so this activity was discontinued.  Cont with POC.     OBJECTIVE IMPAIRMENTS Abnormal gait, decreased activity tolerance, decreased balance, decreased cognition, decreased endurance, and decreased strength.   ACTIVITY LIMITATIONS carrying, lifting, bending, squatting, stairs, and locomotion level  PARTICIPATION LIMITATIONS: meal prep, cleaning, laundry, and shopping  PERSONAL FACTORS Behavior pattern, Fitness, Past/current experiences, Time since onset of injury/illness/exacerbation, and 1 comorbidity: Behavior patterns/cognition  are also affecting patient's functional outcome.   REHAB POTENTIAL: Good for goals established  CLINICAL DECISION MAKING: Stable/uncomplicated  EVALUATION COMPLEXITY: Low  PLAN: PT FREQUENCY: 1x/week   PT DURATION: 6 weeks + eval   PLANNED INTERVENTIONS: Therapeutic exercises, Therapeutic activity, Neuromuscular re-education, Balance training, Gait training, Patient/Family education, Self Care, and Stair training  PLAN FOR NEXT SESSION: Check STG's next session;  LE strengthening and balance  training   Jonie Burdell Suzanne, PT 01/29/2023, 5:12 PM

## 2023-01-29 ENCOUNTER — Encounter: Payer: Self-pay | Admitting: Physical Therapy

## 2023-01-30 ENCOUNTER — Telehealth: Payer: Self-pay | Admitting: Internal Medicine

## 2023-01-30 ENCOUNTER — Other Ambulatory Visit: Payer: Self-pay | Admitting: Obstetrics and Gynecology

## 2023-01-30 ENCOUNTER — Telehealth: Payer: Self-pay

## 2023-01-30 DIAGNOSIS — J4520 Mild intermittent asthma, uncomplicated: Secondary | ICD-10-CM | POA: Diagnosis not present

## 2023-01-30 NOTE — Telephone Encounter (Signed)
Patients mother stating that she no longer needs a call from the nurse.

## 2023-01-30 NOTE — Telephone Encounter (Signed)
Inbound call from patients mother requesting a call to discuss pepcid. Please advise.

## 2023-01-30 NOTE — Telephone Encounter (Signed)
Patient mom requesting a refill on patients diflucan and her terazol cream. Request refill for 45 g instead of 20g. States that the 20g is a smaller tube and patient goes through the 20g fast.

## 2023-01-30 NOTE — Telephone Encounter (Signed)
Noted  

## 2023-01-31 ENCOUNTER — Other Ambulatory Visit: Payer: Self-pay

## 2023-01-31 MED ORDER — FLUCONAZOLE 150 MG PO TABS: 150.0000 mg | ORAL_TABLET | ORAL | 3 refills | Status: DC | PRN

## 2023-01-31 MED ORDER — TERCONAZOLE 0.4 % VA CREA
1.0000 | TOPICAL_CREAM | Freq: Every day | VAGINAL | 0 refills | Status: AC
Start: 2023-01-31 — End: ?

## 2023-01-31 NOTE — Telephone Encounter (Signed)
Mom aware.

## 2023-02-04 ENCOUNTER — Ambulatory Visit: Payer: 59 | Admitting: Physical Therapy

## 2023-02-04 ENCOUNTER — Encounter: Payer: Self-pay | Admitting: Physical Therapy

## 2023-02-04 DIAGNOSIS — M6281 Muscle weakness (generalized): Secondary | ICD-10-CM

## 2023-02-04 DIAGNOSIS — R2681 Unsteadiness on feet: Secondary | ICD-10-CM | POA: Diagnosis not present

## 2023-02-04 DIAGNOSIS — R2689 Other abnormalities of gait and mobility: Secondary | ICD-10-CM | POA: Diagnosis not present

## 2023-02-04 NOTE — Therapy (Unsigned)
OUTPATIENT PHYSICAL THERAPY NEURO TREATMENT NOTE   Patient Name: Lori Miles MRN: [id number] DOB:December 24, 1995, 27 y.o., female Today's Date: 02/05/2023   PCP: Clementeen Graham, PA-C REFERRING PROVIDER: Elveria Rising, NP    PT End of Session - 02/05/23 [phone]     Visit Number 5    Number of Visits 7   eval + 6 sessions   Date for PT Re-Evaluation 02/21/23   pushed out due to scheduling   Authorization Type [facility] Medicare    Authorization Time Period 01-06-23 - 03-18-23    PT Start Time 0757    PT Stop Time 0845    PT Time Calculation (min) 48 min    Activity Tolerance Patient tolerated treatment well    Behavior During Therapy [facility] for tasks assessed/performed                   Past Medical History:  Diagnosis Date   Asthma    Autism disorder    Bacteriuria    Chronic constipation    Cognitive developmental delay    Constipation    COVID-19 03/2020   Disruptive behavior disorder    Dyspnea    if she walks much   Family history of adverse reaction to anesthesia     "hives"-? if anesthesia did take antibiotic   Frequency-urgency syndrome    Gait disorder    ORGANIC PER NERUOLGIST NOTE (DR HICKLING)   Generalized convulsive epilepsy (HCC) NEUROLOGIST-  DR HICKLING   GERD (gastroesophageal reflux disease)    Gout    H/O sinus tachycardia    History of acute respiratory failure 02/14/2015   SECONDARY TO CAP   History of recurrent UTIs    Incontinent of feces    Interstitial cystitis    Intertrigo 11/29/2019   Language development disorder    answers yes and no only   Moderate intellectual disability    OSA (obstructive sleep apnea)    no machine yet    Partial epilepsy with impairment of consciousness (HCC)    FOLLOWED BY DR HICKLING   Pneumonia 2    4 times this year ( 2020)   PONV (postoperative nausea and vomiting)    Seizure (HCC)    04/26/22 over 20 years   Urinary incontinence    Past Surgical History:  Procedure Laterality Date    CYSTOSCOPY N/A 06/03/2017   Procedure: CYSTOSCOPY WITH EXAM UNDER ANESTHESIA;  Surgeon: Jerilee Field, MD;  Location: [facility];  Service: Urology;  Laterality: N/A;   CYSTOSCOPY  2020   CYSTOSCOPY WITH HYDRODISTENSION AND BIOPSY  04/14/2012   Procedure: CYSTOSCOPY/BIOPSY/HYDRODISTENSION;  Surgeon: Lindaann Slough, MD;  Location: [facility];  Service: Urology;;  instillation of marcaine and pyridium   EUA/  PAP SMEAR/  BREAST EXAM  03-12-2016   dr Erin Fulling [facility]   EUA/ VAGINOSCOPY/ COLPOSCOPY FO THE HYMENAL RING  10-04-2009    dr Tamela Oddi  [facility]   IR RADIOLOGIST EVAL & MGMT  07/29/2019   IR RADIOLOGIST EVAL & MGMT  09/30/2019   IR SCLEROTHERAPY OF A FLUID COLLECTION  09/08/2019   RADIOLOGY WITH ANESTHESIA N/A 09/08/2019   Procedure: IR Sclerotherapy Treatment;  Surgeon: Radiologist, Medication, MD;  Location: [facility] OR;  Service: Radiology;  Laterality: N/A;   TONSILLECTOMY     Patient Active Problem List   Diagnosis Date Noted   Weight gain 11/16/2022   Compulsive skin picking 11/14/2022   Poor balance 05/12/2022   Autism 04/30/2022   Well woman exam 04/30/2022  Moderate persistent asthma, uncomplicated 10/09/2021   Vitamin D deficiency 10/09/2021   Encounter for long-term (current) use of high-risk medication 12/29/2020   Menorrhagia 12/16/2020   UTI symptoms 04/10/2020   Renal cyst 11/29/2019   Intertrigo 11/29/2019   Complex care coordination 05/18/2019   Chronic bladder pain 03/03/2019   Screening for cervical cancer 02/26/2019   Glucosuria 06/01/2018   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   Not well controlled moderate persistent asthma 04/29/2018   Gastroesophageal reflux disease 04/29/2018   Recurrent infections 04/29/2018   Dysuria 02/20/2018   Chronic rhinitis 04/03/2017   Recurrent falls 03/21/2017   Weakness 03/21/2017   Venous insufficiency 03/21/2017   OSA on CPAP 09/30/2016   Excessive daytime sleepiness 03/05/2016    Insomnia 10/04/2015   Acute recurrent pansinusitis 07/11/2015   Bronchitis 05/05/2015   Abnormality of gait 03/22/2015   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   Cough variant asthma vs UACS/ vcd  03/15/2015   Pneumonia 02/14/2015   Autism spectrum disorder 02/14/2015   Nausea with vomiting 02/14/2015   Obesity, morbid (HCC) 12/13/2014   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 12/13/2014   Insomnia due to mental disorder 12/13/2014   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   Acanthosis nigricans 11/14/2014   Toeing-in 08/29/2014   Generalized convulsive epilepsy (HCC) 09/28/2012   Partial epilepsy with impairment of consciousness (HCC) 09/28/2012   Cognitive developmental delay 09/28/2012   Recurrent urinary tract infection 08/01/2011   COPD with asthma (HCC) 08/01/2011   Chronic constipation 08/01/2011   Contraception 08/01/2011   Encopresis with constipation and overflow incontinence 02/15/2011   Functional urinary incontinence 02/02/2010   Other convulsions 09/05/2009    ONSET DATE: Referral date 11-14-22  REFERRING DIAG:  F81.9 (ICD-10-CM) - Cognitive developmental delay M20.5X9 (ICD-10-CM) - In-toeing, unspecified laterality E66.01 (ICD-10-CM) - Obesity, morbid F84.0 (ICD-10-CM) - Autism spectrum disorder R26.9 (ICD-10-CM) - Abnormality of gait R29.6 (ICD-10-CM) - Recurrent falls R53.1 (ICD-10-CM) - Weakness  THERAPY DIAG:  Other abnormalities of gait and mobility  Muscle weakness (generalized)  Unsteadiness on feet  Rationale for Evaluation and Treatment Rehabilitation  SUBJECTIVE:                                                                                                                                                                                              SUBJECTIVE STATEMENT: No new problems or changes reported by pt's mother - pt looking at Ipad at start of session Pt accompanied by:  mother, [name]  PERTINENT HISTORY: Autism  spectrum disorder, h/o generalized convulsive epilepsy, steroid induced diabetes, h/o seizures, moderate intellectual disability  PAIN:  Are you  having pain? Yes -  pt rates pain 10/10 but unsure if pt able to cognitively understand rating scale and rate accurately -points to her Rt knee and left foot ; mother states she has swelling in both legs; pt is not consistently ambulating with an antalgic gait pattern  PRECAUTIONS: None  WEIGHT BEARING RESTRICTIONS No  FALLS: Has patient fallen in last 6 months? No  LIVING ENVIRONMENT: Lives with: lives with their family Lives in: House/apartment Stairs: No Has following equipment at home: Environmental consultant - 4 wheeled, Wheelchair (manual), and Tour manager  PLOF: Independent with household mobility with device, Needs assistance with ADLs, and Needs assistance with homemaking  Needs assistance with community ambulation (supervision needed due to decreased cognition)  PATIENT GOALS increase activity to increase strength; improve balance and endurance  OBJECTIVE:   DIAGNOSTIC FINDINGS: N/A  COGNITION: Overall cognitive status: Impaired - developmental delay   SENSATION: WFL  COORDINATION: WFL's bil. LE's   POSTURE: rounded shoulders and forward head  LOWER EXTREMITY ROM:   WFL's bil. LE's   LOWER EXTREMITY MMT:  accurate assessment is questionable due to decreased cognition and decreased effort exhibited during testing  MMT Right Eval Left Eval  Hip flexion 3+ 3+  Hip extension    Hip abduction    Hip adduction    Hip internal rotation    Hip external rotation    Knee flexion 3+ 3+  Knee extension 4 3+  Ankle dorsiflexion 4 4-  Ankle plantarflexion    Ankle inversion    Ankle eversion    (Blank rows = not tested)  Pt reports pain/discomfort with resistance with MMT  BED MOBILITY:  Independent  TRANSFERS: Assistive device utilized: None  Sit to stand: SBA Stand to sit: SBA   STAIRS:  Level of Assistance:  SBA  Stair  Negotiation Technique: Step to Pattern Alternating Pattern  with Bilateral Rails  Number of Stairs: 4   Height of Stairs: 6  Comments: pt ascended & descended steps using step over step sequence using rails  GAIT: Gait pattern: step through pattern; LLE internally rotated with increased supination noted in Lt foot in stance Distance walked: 27' with no device x 3 reps (no rest break between reps) Assistive device utilized: None Level of assistance: SBA Comments: see above Gait velocity:  15.64 secs = 2.10 ft/sec without device  FUNCTIONAL TESTs:   5 times sit to stand: 33.68 secs from high/low mat table without UE support Timed up and go (TUG):    17.69 secs without device  Pt unable to attempt SLS on RLE due to c/o shaking/discomfort; able to stand on LLE for approx. 2 secs with UE support Pt able to pick up object off floor and return to standing without LOB - gel bottle  Pt stood with feet together on floor for 30 secs with SBA     PATIENT EDUCATION: Education details: eval results with POC of 6 weeks Person educated: Parent Education method: Explanation Education comprehension: verbalized understanding   HOME EXERCISE PROGRAM: To be issued    Today's Treatment:  02-04-23  Gait:Gait pattern: step through pattern; LLE internally rotated with increased supination noted in Lt foot in stance Distance walked: 350' (3 laps) x 1: 230' x 1 with CGA   Assistive device utilized: None Level of assistance: SBA  Step training - 4 steps x 2 reps using bil. Hand rails - step over step sequence with ascension and descension  Pt amb. Approx. 35' x 2 reps tossing/catching medium sized  ball for improved balance with dynamic gait - CGA for safety  TherEx: SciFit Level 3 x 6" with bil. Ue's and LE's  Leg press bil. LE's 50# 10 reps x 3 sets  Sit to stand from mat 5 reps - feet on Airex - no UE support  Step up exercise RLE and LLE 10 reps each leg for strengthening - bil. UE  support on hand rails   NeuroRe-ed: TUG score = 17.25 secs without device  Rockerboard 10 reps x 2 sets inside // bars with UE support  Stepping over black balance beam - 10 reps each with CGA  Obstacle course - used lower height hurdles for stepping over and 3 cones for cone taps for improved SLS on each leg and to improve balance with small turns as pt performed fig. 8's around cones  Marching on Airex 10 reps with UE support on // bars   Stepping down to floor from Airex with min HHA - 5 reps with RLE and 5 reps with LLE with min HHA            Pt stood on blue mat for compliant surface training - performed tossing and catching ball to PT for improved dynamic standing balance  GOALS: Goals reviewed with patient? Yes  SHORT TERM GOALS: Target date: 01/31/2023 (3 weeks)  Amb. 350' (3 laps in gym) nonstop without device with SBA to demo increased endurance. Baseline: 115' x 2 reps with SBA;  350' Goal status: Goal met 02-04-23  2.  Improve TUG score to </= 15 secs without device to decrease fall risk.  Baseline: 17.69 secs;  17.25 secs  Goal status: Not met 02-04-23  3.  Perform HEP for balance and strengthening with mother's assist.  Baseline:  Goal status: Goal met 02-04-23   LONG TERM GOALS: Target date: 02/21/2023 - 6 weeks  Pt will amb. 465' without device on flat, even surface without LOB with SBA. Baseline: 115' x 2 reps Goal status: INITIAL  2.  Perform at least 10" on recumbent bike nonstop for improved endurance/activity.  Baseline:  Goal status: INITIAL  3.  Improve TUG score to </= 13.5 secs without device to decrease fall risk.  Baseline: 17.69 secs without device Goal status: INITIAL  4.  Pt will perform sit to stand 5 times without use of UE support from mat table in </= 20 secs to demo improved LE strength. Baseline:  33.68 secs with UE support intermittently Goal status: INITIAL  5.  Provide updated HEP as appropriate to be performed with mother's  assist. Baseline:  Goal status: INITIAL  ASSESSMENT:  CLINICAL IMPRESSION: Pt has met STG's #1 & 3:  STG #2 not met as TUG score was 17.25 secs in today's session, compared to 17.69 secs at initial eval.  Goal not met as set at </= 15 secs.  Pt did well with balance activities with no major LOB occurring.  Pt's RLE continues to internally rotate during gait but pt able to externally rotate Rt foot to neutral with verbal cues.  Cont with POC.     OBJECTIVE IMPAIRMENTS Abnormal gait, decreased activity tolerance, decreased balance, decreased cognition, decreased endurance, and decreased strength.   ACTIVITY LIMITATIONS carrying, lifting, bending, squatting, stairs, and locomotion level  PARTICIPATION LIMITATIONS: meal prep, cleaning, laundry, and shopping  PERSONAL FACTORS Behavior pattern, Fitness, Past/current experiences, Time since onset of injury/illness/exacerbation, and 1 comorbidity: Behavior patterns/cognition  are also affecting patient's functional outcome.   REHAB POTENTIAL: Good for goals established  CLINICAL DECISION MAKING: Stable/uncomplicated  EVALUATION COMPLEXITY: Low  PLAN: PT FREQUENCY: 1x/week   PT DURATION: 6 weeks + eval   PLANNED INTERVENTIONS: Therapeutic exercises, Therapeutic activity, Neuromuscular re-education, Balance training, Gait training, Patient/Family education, Self Care, and Stair training  PLAN FOR NEXT SESSION:  LE strengthening and balance training   Atreus Hasz, Donavan Burnet, PT 02/05/2023, 9:46 AM

## 2023-02-11 ENCOUNTER — Ambulatory Visit: Payer: 59 | Admitting: Physical Therapy

## 2023-02-11 DIAGNOSIS — R2681 Unsteadiness on feet: Secondary | ICD-10-CM | POA: Diagnosis not present

## 2023-02-11 DIAGNOSIS — R2689 Other abnormalities of gait and mobility: Secondary | ICD-10-CM

## 2023-02-11 DIAGNOSIS — M6281 Muscle weakness (generalized): Secondary | ICD-10-CM | POA: Diagnosis not present

## 2023-02-11 NOTE — Therapy (Unsigned)
OUTPATIENT PHYSICAL THERAPY NEURO TREATMENT NOTE   Patient Name: Lori Miles MRN: [id number] DOB:1995/06/11, 27 y.o., female Today's Date: 02/12/2023   PCP: Karlyne Greenspan REFERRING PROVIDER: Elveria Rising, NP    PT End of Session - 02/12/23 1626     Visit Number 6    Number of Visits 7   eval + 6 sessions   Date for PT Re-Evaluation 02/21/23   pushed out due to scheduling   Authorization Type [facility] Medicare    Authorization Time Period 01-06-23 - 03-18-23    PT Start Time 0758    PT Stop Time 0845    PT Time Calculation (min) 47 min    Activity Tolerance Patient tolerated treatment well    Behavior During Therapy [facility] for tasks assessed/performed                    Past Medical History:  Diagnosis Date   Asthma    Autism disorder    Bacteriuria    Chronic constipation    Cognitive developmental delay    Constipation    COVID-19 03/2020   Disruptive behavior disorder    Dyspnea    if she walks much   Family history of adverse reaction to anesthesia     "hives"-? if anesthesia did take antibiotic   Frequency-urgency syndrome    Gait disorder    ORGANIC PER NERUOLGIST NOTE (DR HICKLING)   Generalized convulsive epilepsy (HCC) NEUROLOGIST-  DR HICKLING   GERD (gastroesophageal reflux disease)    Gout    H/O sinus tachycardia    History of acute respiratory failure 02/14/2015   SECONDARY TO CAP   History of recurrent UTIs    Incontinent of feces    Interstitial cystitis    Intertrigo 11/29/2019   Language development disorder    answers yes and no only   Moderate intellectual disability    OSA (obstructive sleep apnea)    no machine yet    Partial epilepsy with impairment of consciousness (HCC)    FOLLOWED BY DR HICKLING   Pneumonia 2    4 times this year ( 2020)   PONV (postoperative nausea and vomiting)    Seizure (HCC)    04/26/22 over 20 years   Urinary incontinence    Past Surgical History:  Procedure Laterality Date    CYSTOSCOPY N/A 06/03/2017   Procedure: CYSTOSCOPY WITH EXAM UNDER ANESTHESIA;  Surgeon: Jerilee Field, MD;  Location: [facility];  Service: Urology;  Laterality: N/A;   CYSTOSCOPY  2020   CYSTOSCOPY WITH HYDRODISTENSION AND BIOPSY  04/14/2012   Procedure: CYSTOSCOPY/BIOPSY/HYDRODISTENSION;  Surgeon: Lindaann Slough, MD;  Location: [facility];  Service: Urology;;  instillation of marcaine and pyridium   EUA/  PAP SMEAR/  BREAST EXAM  03-12-2016   dr Erin Fulling [facility]   EUA/ VAGINOSCOPY/ COLPOSCOPY FO THE HYMENAL RING  10-04-2009    dr Tamela Oddi  [facility]   IR RADIOLOGIST EVAL & MGMT  07/29/2019   IR RADIOLOGIST EVAL & MGMT  09/30/2019   IR SCLEROTHERAPY OF A FLUID COLLECTION  09/08/2019   RADIOLOGY WITH ANESTHESIA N/A 09/08/2019   Procedure: IR Sclerotherapy Treatment;  Surgeon: Radiologist, Medication, MD;  Location: [facility] OR;  Service: Radiology;  Laterality: N/A;   TONSILLECTOMY     Patient Active Problem List   Diagnosis Date Noted   Weight gain 11/16/2022   Compulsive skin picking 11/14/2022   Poor balance 05/12/2022   Autism 04/30/2022   Well woman exam  04/30/2022   Moderate persistent asthma, uncomplicated 10/09/2021   Vitamin D deficiency 10/09/2021   Encounter for long-term (current) use of high-risk medication 12/29/2020   Menorrhagia 12/16/2020   UTI symptoms 04/10/2020   Renal cyst 11/29/2019   Intertrigo 11/29/2019   Complex care coordination 05/18/2019   Chronic bladder pain 03/03/2019   Screening for cervical cancer 02/26/2019   Glucosuria 06/01/2018   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   Not well controlled moderate persistent asthma 04/29/2018   Gastroesophageal reflux disease 04/29/2018   Recurrent infections 04/29/2018   Dysuria 02/20/2018   Chronic rhinitis 04/03/2017   Recurrent falls 03/21/2017   Weakness 03/21/2017   Venous insufficiency 03/21/2017   OSA on CPAP 09/30/2016   Excessive daytime sleepiness 03/05/2016    Insomnia 10/04/2015   Acute recurrent pansinusitis 07/11/2015   Bronchitis 05/05/2015   Abnormality of gait 03/22/2015   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   Cough variant asthma vs UACS/ vcd  03/15/2015   Pneumonia 02/14/2015   Autism spectrum disorder 02/14/2015   Nausea with vomiting 02/14/2015   Obesity, morbid (HCC) 12/13/2014   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 12/13/2014   Insomnia due to mental disorder 12/13/2014   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   Acanthosis nigricans 11/14/2014   Toeing-in 08/29/2014   Generalized convulsive epilepsy (HCC) 09/28/2012   Partial epilepsy with impairment of consciousness (HCC) 09/28/2012   Cognitive developmental delay 09/28/2012   Recurrent urinary tract infection 08/01/2011   COPD with asthma (HCC) 08/01/2011   Chronic constipation 08/01/2011   Contraception 08/01/2011   Encopresis with constipation and overflow incontinence 02/15/2011   Functional urinary incontinence 02/02/2010   Other convulsions 09/05/2009    ONSET DATE: Referral date 11-14-22  REFERRING DIAG:  F81.9 (ICD-10-CM) - Cognitive developmental delay M20.5X9 (ICD-10-CM) - In-toeing, unspecified laterality E66.01 (ICD-10-CM) - Obesity, morbid F84.0 (ICD-10-CM) - Autism spectrum disorder R26.9 (ICD-10-CM) - Abnormality of gait R29.6 (ICD-10-CM) - Recurrent falls R53.1 (ICD-10-CM) - Weakness  THERAPY DIAG:  Other abnormalities of gait and mobility  Muscle weakness (generalized)  Unsteadiness on feet  Rationale for Evaluation and Treatment Rehabilitation  SUBJECTIVE:                                                                                                                                                                                              SUBJECTIVE STATEMENT: [name] reports pt has been complaining about her legs hurting during the past week, cause of pain unknown;  mother states she has been given pt  Hydroxyzine for the pain Pt accompanied by:  mother, Lori Miles  PERTINENT HISTORY: Autism spectrum disorder, h/o  generalized convulsive epilepsy, steroid induced diabetes, h/o seizures, moderate intellectual disability  PAIN:  Are you having pain? Yes -  pt rates pain 10/10 but unsure if pt able to cognitively understand rating scale and rate accurately -points to her Rt knee and left foot ; mother states she has swelling in both legs; pt is not consistently ambulating with an antalgic gait pattern  PRECAUTIONS: None  WEIGHT BEARING RESTRICTIONS No  FALLS: Has patient fallen in last 6 months? No  LIVING ENVIRONMENT: Lives with: lives with their family Lives in: House/apartment Stairs: No Has following equipment at home: Environmental consultant - 4 wheeled, Wheelchair (manual), and Tour manager  PLOF: Independent with household mobility with device, Needs assistance with ADLs, and Needs assistance with homemaking  Needs assistance with community ambulation (supervision needed due to decreased cognition)  PATIENT GOALS increase activity to increase strength; improve balance and endurance  OBJECTIVE:   DIAGNOSTIC FINDINGS: N/A  COGNITION: Overall cognitive status: Impaired - developmental delay   SENSATION: WFL  COORDINATION: WFL's bil. LE's   POSTURE: rounded shoulders and forward head  LOWER EXTREMITY ROM:   WFL's bil. LE's   LOWER EXTREMITY MMT:  accurate assessment is questionable due to decreased cognition and decreased effort exhibited during testing  MMT Right Eval Left Eval  Hip flexion 3+ 3+  Hip extension    Hip abduction    Hip adduction    Hip internal rotation    Hip external rotation    Knee flexion 3+ 3+  Knee extension 4 3+  Ankle dorsiflexion 4 4-  Ankle plantarflexion    Ankle inversion    Ankle eversion    (Blank rows = not tested)  Pt reports pain/discomfort with resistance with MMT  BED MOBILITY:  Independent  TRANSFERS: Assistive device utilized: None   Sit to stand: SBA Stand to sit: SBA   STAIRS:  Level of Assistance:  SBA  Stair Negotiation Technique: Step to Pattern Alternating Pattern  with Bilateral Rails  Number of Stairs: 4   Height of Stairs: 6  Comments: pt ascended & descended steps using step over step sequence using rails  GAIT: Gait pattern: step through pattern; LLE internally rotated with increased supination noted in Lt foot in stance Distance walked: 39' with no device x 3 reps (no rest break between reps) Assistive device utilized: None Level of assistance: SBA Comments: see above Gait velocity:  15.64 secs = 2.10 ft/sec without device  FUNCTIONAL TESTs:   5 times sit to stand: 33.68 secs from high/low mat table without UE support Timed up and go (TUG):    17.69 secs without device  Pt unable to attempt SLS on RLE due to c/o shaking/discomfort; able to stand on LLE for approx. 2 secs with UE support Pt able to pick up object off floor and return to standing without LOB - gel bottle  Pt stood with feet together on floor for 30 secs with SBA     PATIENT EDUCATION: Education details: eval results with POC of 6 weeks Person educated: Parent Education method: Explanation Education comprehension: verbalized understanding   HOME EXERCISE PROGRAM: To be issued    Today's Treatment:  02-11-23  Gait:Gait pattern: step through pattern; LLE internally rotated with increased supination noted in Lt foot in stance Distance walked: 115' , 200' x 1, 200' x 1, 400' = 915' total distance Assistive device utilized: None Level of assistance: SBA  Step training - 4 steps x 2 reps using bil. Hand rails - step over  step sequence with ascension and descension    TherEx: SciFit Level 3 x 6" with bil. Ue's and LE's  Leg press bil. LE's 50# 10 reps x 3 sets  Sit to stand from mat 5 reps - feet on Airex - no UE support     NeuroRe-ed:  Rockerboard 10 reps x 2 sets inside // bars with UE support  Stepping  over black balance beam - 10 reps each with CGA  Marching on Airex 10 reps with UE support on // bars   Attempted sidestepping on blue foam balance beam inside // bars but pt had Lt knee instability so this activity was discontinued             Pt stood on blue mat for compliant surface training - performed tossing and catching ball to PT for improved standing balance;  performed picking up bean bags off of mat (5 used) and  tossed individually into basket - pt walked approx. 4' on mat to retrieve bean bag - CGA - cues to bend knees when reaching down for bean bag   GOALS: Goals reviewed with patient? Yes  SHORT TERM GOALS: Target date: 01/31/2023 (3 weeks)  Amb. 350' (3 laps in gym) nonstop without device with SBA to demo increased endurance. Baseline: 115' x 2 reps with SBA;  350' Goal status: Goal met 02-04-23  2.  Improve TUG score to </= 15 secs without device to decrease fall risk.  Baseline: 17.69 secs;  17.25 secs  Goal status: Not met 02-04-23  3.  Perform HEP for balance and strengthening with mother's assist.  Baseline:  Goal status: Goal met 02-04-23   LONG TERM GOALS: Target date: 02/21/2023 - 6 weeks  Pt will amb. 465' without device on flat, even surface without LOB with SBA. Baseline: 115' x 2 reps Goal status: INITIAL  2.  Perform at least 10" on recumbent bike nonstop for improved endurance/activity.  Baseline:  Goal status: INITIAL  3.  Improve TUG score to </= 13.5 secs without device to decrease fall risk.  Baseline: 17.69 secs without device Goal status: INITIAL  4.  Pt will perform sit to stand 5 times without use of UE support from mat table in </= 20 secs to demo improved LE strength. Baseline:  33.68 secs with UE support intermittently Goal status: INITIAL  5.  Provide updated HEP as appropriate to be performed with mother's assist. Baseline:  Goal status: INITIAL  ASSESSMENT:  CLINICAL IMPRESSION: PT session focused on bil. LE  strengthening, gait training for strengthening & endurance training, and balance training on compliant surfaces.  Pt had Lt knee instability with sidestepping on blue foam balance beam inside // bars so this activity was discontinued.  Pt able to maintain balance with standing on blue mat with no knee instability occurring.  Cont with POC.     OBJECTIVE IMPAIRMENTS Abnormal gait, decreased activity tolerance, decreased balance, decreased cognition, decreased endurance, and decreased strength.   ACTIVITY LIMITATIONS carrying, lifting, bending, squatting, stairs, and locomotion level  PARTICIPATION LIMITATIONS: meal prep, cleaning, laundry, and shopping  PERSONAL FACTORS Behavior pattern, Fitness, Past/current experiences, Time since onset of injury/illness/exacerbation, and 1 comorbidity: Behavior patterns/cognition  are also affecting patient's functional outcome.   REHAB POTENTIAL: Good for goals established  CLINICAL DECISION MAKING: Stable/uncomplicated  EVALUATION COMPLEXITY: Low  PLAN: PT FREQUENCY: 1x/week   PT DURATION: 6 weeks + eval   PLANNED INTERVENTIONS: Therapeutic exercises, Therapeutic activity, Neuromuscular re-education, Balance training, Gait training, Patient/Family education, Self  Care, and Stair training  PLAN FOR NEXT SESSION:  LE strengthening and balance training   Lori Miles, Lori Miles, PT 02/12/2023, 4:28 PM

## 2023-02-12 ENCOUNTER — Encounter: Payer: Self-pay | Admitting: Physical Therapy

## 2023-02-20 ENCOUNTER — Ambulatory Visit: Payer: 59 | Attending: Family | Admitting: Physical Therapy

## 2023-02-20 ENCOUNTER — Encounter: Payer: Self-pay | Admitting: Physical Therapy

## 2023-02-20 DIAGNOSIS — R2689 Other abnormalities of gait and mobility: Secondary | ICD-10-CM | POA: Diagnosis not present

## 2023-02-20 DIAGNOSIS — M6281 Muscle weakness (generalized): Secondary | ICD-10-CM

## 2023-02-20 DIAGNOSIS — R2681 Unsteadiness on feet: Secondary | ICD-10-CM

## 2023-02-20 NOTE — Therapy (Signed)
OUTPATIENT PHYSICAL THERAPY NEURO TREATMENT NOTE/DISCHARGE SUMMARY   Patient Name: Lori Miles MRN: [id number] DOB:07-06-1995, 27 y.o., female Today's Date: 02/20/2023   PCP: Karlyne Greenspan REFERRING PROVIDER: Elveria Rising, NP    PT End of Session - 02/20/23 0749     Visit Number 7    Number of Visits 7   eval + 6 sessions   Date for PT Re-Evaluation 02/21/23   pushed out due to scheduling   Authorization Type [facility] Medicare    Authorization Time Period 01-06-23 - 03-18-23    PT Start Time 0755    PT Stop Time 0845    PT Time Calculation (min) 50 min    Activity Tolerance Patient tolerated treatment well    Behavior During Therapy [facility] for tasks assessed/performed                    Past Medical History:  Diagnosis Date   Asthma    Autism disorder    Bacteriuria    Chronic constipation    Cognitive developmental delay    Constipation    COVID-19 03/2020   Disruptive behavior disorder    Dyspnea    if she walks much   Family history of adverse reaction to anesthesia     "hives"-? if anesthesia did take antibiotic   Frequency-urgency syndrome    Gait disorder    ORGANIC PER NERUOLGIST NOTE (DR HICKLING)   Generalized convulsive epilepsy (HCC) NEUROLOGIST-  DR HICKLING   GERD (gastroesophageal reflux disease)    Gout    H/O sinus tachycardia    History of acute respiratory failure 02/14/2015   SECONDARY TO CAP   History of recurrent UTIs    Incontinent of feces    Interstitial cystitis    Intertrigo 11/29/2019   Language development disorder    answers yes and no only   Moderate intellectual disability    OSA (obstructive sleep apnea)    no machine yet    Partial epilepsy with impairment of consciousness (HCC)    FOLLOWED BY DR HICKLING   Pneumonia 2    4 times this year ( 2020)   PONV (postoperative nausea and vomiting)    Seizure (HCC)    04/26/22 over 20 years   Urinary incontinence    Past Surgical History:  Procedure  Laterality Date   CYSTOSCOPY N/A 06/03/2017   Procedure: CYSTOSCOPY WITH EXAM UNDER ANESTHESIA;  Surgeon: Jerilee Field, MD;  Location: [facility];  Service: Urology;  Laterality: N/A;   CYSTOSCOPY  2020   CYSTOSCOPY WITH HYDRODISTENSION AND BIOPSY  04/14/2012   Procedure: CYSTOSCOPY/BIOPSY/HYDRODISTENSION;  Surgeon: Lindaann Slough, MD;  Location: [facility];  Service: Urology;;  instillation of marcaine and pyridium   EUA/  PAP SMEAR/  BREAST EXAM  03-12-2016   dr Erin Fulling [facility]   EUA/ VAGINOSCOPY/ COLPOSCOPY FO THE HYMENAL RING  10-04-2009    dr Tamela Oddi  [facility]   IR RADIOLOGIST EVAL & MGMT  07/29/2019   IR RADIOLOGIST EVAL & MGMT  09/30/2019   IR SCLEROTHERAPY OF A FLUID COLLECTION  09/08/2019   RADIOLOGY WITH ANESTHESIA N/A 09/08/2019   Procedure: IR Sclerotherapy Treatment;  Surgeon: Radiologist, Medication, MD;  Location: [facility] OR;  Service: Radiology;  Laterality: N/A;   TONSILLECTOMY     Patient Active Problem List   Diagnosis Date Noted   Weight gain 11/16/2022   Compulsive skin picking 11/14/2022   Poor balance 05/12/2022   Autism 04/30/2022   Well woman  exam 04/30/2022   Moderate persistent asthma, uncomplicated 10/09/2021   Vitamin D deficiency 10/09/2021   Encounter for long-term (current) use of high-risk medication 12/29/2020   Menorrhagia 12/16/2020   UTI symptoms 04/10/2020   Renal cyst 11/29/2019   Intertrigo 11/29/2019   Complex care coordination 05/18/2019   Chronic bladder pain 03/03/2019   Screening for cervical cancer 02/26/2019   Glucosuria 06/01/2018   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   Not well controlled moderate persistent asthma 04/29/2018   Gastroesophageal reflux disease 04/29/2018   Recurrent infections 04/29/2018   Dysuria 02/20/2018   Chronic rhinitis 04/03/2017   Recurrent falls 03/21/2017   Weakness 03/21/2017   Venous insufficiency 03/21/2017   OSA on CPAP 09/30/2016   Excessive daytime sleepiness  03/05/2016   Insomnia 10/04/2015   Acute recurrent pansinusitis 07/11/2015   Bronchitis 05/05/2015   Abnormality of gait 03/22/2015   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   Cough variant asthma vs UACS/ vcd  03/15/2015   Pneumonia 02/14/2015   Autism spectrum disorder 02/14/2015   Nausea with vomiting 02/14/2015   Obesity, morbid (HCC) 12/13/2014   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 12/13/2014   Insomnia due to mental disorder 12/13/2014   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   Acanthosis nigricans 11/14/2014   Toeing-in 08/29/2014   Generalized convulsive epilepsy (HCC) 09/28/2012   Partial epilepsy with impairment of consciousness (HCC) 09/28/2012   Cognitive developmental delay 09/28/2012   Recurrent urinary tract infection 08/01/2011   COPD with asthma (HCC) 08/01/2011   Chronic constipation 08/01/2011   Contraception 08/01/2011   Encopresis with constipation and overflow incontinence 02/15/2011   Functional urinary incontinence 02/02/2010   Other convulsions 09/05/2009    ONSET DATE: Referral date 11-14-22  REFERRING DIAG:  F81.9 (ICD-10-CM) - Cognitive developmental delay M20.5X9 (ICD-10-CM) - In-toeing, unspecified laterality E66.01 (ICD-10-CM) - Obesity, morbid F84.0 (ICD-10-CM) - Autism spectrum disorder R26.9 (ICD-10-CM) - Abnormality of gait R29.6 (ICD-10-CM) - Recurrent falls R53.1 (ICD-10-CM) - Weakness  THERAPY DIAG:  Other abnormalities of gait and mobility  Muscle weakness (generalized)  Unsteadiness on feet  Rationale for Evaluation and Treatment Rehabilitation  SUBJECTIVE:                                                                                                                                                                                              SUBJECTIVE STATEMENT: [name] reports pt has another UTI - says Zenaida went to her sister's house for Thanksgiving and she ate a lot of things she shouldn't  have, which has caused her to develop another UTI Pt accompanied by:  mother, [name]  PERTINENT  HISTORY: Autism spectrum disorder, h/o generalized convulsive epilepsy, steroid induced diabetes, h/o seizures, moderate intellectual disability  PAIN:  Are you having pain? None reported  PRECAUTIONS: None  WEIGHT BEARING RESTRICTIONS No  FALLS: Has patient fallen in last 6 months? No  LIVING ENVIRONMENT: Lives with: lives with their family Lives in: House/apartment Stairs: No Has following equipment at home: Environmental consultant - 4 wheeled, Wheelchair (manual), and Tour manager  PLOF: Independent with household mobility with device, Needs assistance with ADLs, and Needs assistance with homemaking  Needs assistance with community ambulation (supervision needed due to decreased cognition)  PATIENT GOALS increase activity to increase strength; improve balance and endurance  OBJECTIVE:   DIAGNOSTIC FINDINGS: N/A  COGNITION: Overall cognitive status: Impaired - developmental delay   SENSATION: WFL  COORDINATION: WFL's bil. LE's   POSTURE: rounded shoulders and forward head  LOWER EXTREMITY ROM:   WFL's bil. LE's   LOWER EXTREMITY MMT:  accurate assessment is questionable due to decreased cognition and decreased effort exhibited during testing  MMT Right Eval Left Eval  Hip flexion 3+ 3+  Hip extension    Hip abduction    Hip adduction    Hip internal rotation    Hip external rotation    Knee flexion 3+ 3+  Knee extension 4 3+  Ankle dorsiflexion 4 4-  Ankle plantarflexion    Ankle inversion    Ankle eversion    (Blank rows = not tested)  Pt reports pain/discomfort with resistance with MMT  BED MOBILITY:  Independent  TRANSFERS: Assistive device utilized: None  Sit to stand: SBA Stand to sit: SBA   STAIRS:  Level of Assistance:  SBA  Stair Negotiation Technique: Step to Pattern Alternating Pattern  with Bilateral Rails  Number of Stairs: 4   Height of Stairs:  6  Comments: pt ascended & descended steps using step over step sequence using rails  GAIT: Gait pattern: step through pattern; LLE internally rotated with increased supination noted in Lt foot in stance Distance walked: 52' with no device x 3 reps (no rest break between reps) Assistive device utilized: None Level of assistance: SBA Comments: see above Gait velocity:  15.64 secs = 2.10 ft/sec without device  FUNCTIONAL TESTs:   5 times sit to stand: 33.68 secs from high/low mat table without UE support Timed up and go (TUG):    17.69 secs without device  Pt unable to attempt SLS on RLE due to c/o shaking/discomfort; able to stand on LLE for approx. 2 secs with UE support Pt able to pick up object off floor and return to standing without LOB - gel bottle  Pt stood with feet together on floor for 30 secs with SBA     PATIENT EDUCATION: Education details: eval results with POC of 6 weeks Person educated: Parent Education method: Explanation Education comprehension: verbalized understanding   HOME EXERCISE PROGRAM: To be issued    Today's Treatment:  02-20-23  Gait:  Gait pattern: step through pattern; LLE internally rotated with increased supination noted in Lt foot in stance Distance walked: 115' x laps a= 460' Assistive device utilized: None Level of assistance: SBA  Step training - 4 steps x 2 reps using bil. Hand rails - step over step sequence with ascension and descension    TherEx: SciFit Level 2.5 x 2:40, then level 1.4 x with bil. Ue's and Le's x 5:30" = 8" total  Leg press bil. LE's 40# 10 reps x 3 sets  Sit to stand from mat  2 reps - feet on Airex - with UE support   5x sit to stand 20.35 secs without UE support from mat    NeuroRe-ed:  Rockerboard 10 reps x 2 sets inside // bars with UE support  Stepping over black balance beam - 5 reps each with CGA  Sit to stand with feet on Airex 2 reps from mat with RUE support   TUG score = 18.65, 17.35  secs without device   GOALS: Goals reviewed with patient? Yes  SHORT TERM GOALS: Target date: 01/31/2023 (3 weeks)  Amb. 350' (3 laps in gym) nonstop without device with SBA to demo increased endurance. Baseline: 115' x 2 reps with SBA;  350' Goal status: Goal met 02-04-23  2.  Improve TUG score to </= 15 secs without device to decrease fall risk.  Baseline: 17.69 secs;  17.25 secs  Goal status: Not met 02-04-23  3.  Perform HEP for balance and strengthening with mother's assist.  Baseline:  Goal status: Goal met 02-04-23   LONG TERM GOALS: Target date: 02/21/2023 - 6 weeks  Pt will amb. 465' without device on flat, even surface without LOB with SBA. Baseline: 115' x 2 reps;  02-20-23 - 115' x 4 = 460' without device without LOB Goal status: Goal met 02-20-23  2.  Perform at least 10" on recumbent bike nonstop for improved endurance/activity.  Baseline:  Goal status: Not met - 02-20-23 - pt rode approx. 8" with intermittent rest breaks  3.  Improve TUG score to </= 13.5 secs without device to decrease fall risk.  Baseline: 17.69 secs without device; 02-20-23 = 18.65, 17.35 secs Goal status: Not met 02-20-23  4.  Pt will perform sit to stand 5 times without use of UE support from mat table in </= 20 secs to demo improved LE strength. Baseline:  33.68 secs with UE support intermittently; 20.35 secs on 02-20-23 Goal status: Partially met 02-20-23  5.  Provide updated HEP as appropriate to be performed with mother's assist. Baseline:  Goal status: Goal met 02-20-23 ASSESSMENT:  CLINICAL IMPRESSION: PT session focused on LTG assessment for discharge as pt has completed 6/6 authorized visits at this time.  Pt has met LTG #1 and 5;  LTG's # 2 and 3 have not been met as pt unable to perform SciFit exercise for 10" nonstop due to c/o fatigue and TUG score remains same as that at initial eval, 17.35 secs without device (score at eval was 17.69 secs  without device).  Pt has partially met  LTG #4 as 5x sit to stand score = 20.35 secs (goal </= 20 secs).  Pt has plateaued in maximizing functional progress at this time - no further needs identified.  D/C due to completion of program.    OBJECTIVE IMPAIRMENTS Abnormal gait, decreased activity tolerance, decreased balance, decreased cognition, decreased endurance, and decreased strength.   ACTIVITY LIMITATIONS carrying, lifting, bending, squatting, stairs, and locomotion level  PARTICIPATION LIMITATIONS: meal prep, cleaning, laundry, and shopping  PERSONAL FACTORS Behavior pattern, Fitness, Past/current experiences, Time since onset of injury/illness/exacerbation, and 1 comorbidity: Behavior patterns/cognition  are also affecting patient's functional outcome.   REHAB POTENTIAL: Good for goals established  CLINICAL DECISION MAKING: Stable/uncomplicated  EVALUATION COMPLEXITY: Low  PLAN: PT FREQUENCY: 1x/week   PT DURATION: 6 weeks + eval   PLANNED INTERVENTIONS: Therapeutic exercises, Therapeutic activity, Neuromuscular re-education, Balance training, Gait training, Patient/Family education, Self Care, and Stair training  PLAN FOR NEXT SESSION:    D/C due to  completion of program - 6/6 visits completed     PHYSICAL THERAPY DISCHARGE SUMMARY  Visits from Start of Care: 7  Current functional level related to goals / functional outcomes: See above for progress towards goals   Remaining deficits: Continued decreased high level balance skills and decreased high level gait Decreased endurance/activity tolerance    Education / Equipment: HEP including exercises and therapeutic activities for improving balance has been provided.  Strongly emphasized need for exercise at [facility] for use of recumbent bike to pt's mother for continued acitvity on regular basis.   Patient/mother agrees to discharge.  Patient goals were partially met. Patient is being discharged due to maximized rehab potential.  LTG's were partially met.  Pt  has completed 6/6 authorized visits at this time - pt has plateaued in maximizing functional progress at this time - no further needs identified which warrant skilled PT intervention.   Kary Kos, PT 02/20/2023, 1:21 PM

## 2023-02-27 DIAGNOSIS — L249 Irritant contact dermatitis, unspecified cause: Secondary | ICD-10-CM | POA: Diagnosis not present

## 2023-02-28 ENCOUNTER — Other Ambulatory Visit: Payer: Self-pay | Admitting: Obstetrics and Gynecology

## 2023-02-28 MED ORDER — TERCONAZOLE 0.8 % VA CREA: TOPICAL_CREAM | VAGINAL | 6 refills | Status: DC

## 2023-03-06 DIAGNOSIS — J4520 Mild intermittent asthma, uncomplicated: Secondary | ICD-10-CM | POA: Diagnosis not present

## 2023-03-21 DIAGNOSIS — N390 Urinary tract infection, site not specified: Secondary | ICD-10-CM | POA: Diagnosis not present

## 2023-04-04 ENCOUNTER — Other Ambulatory Visit (INDEPENDENT_AMBULATORY_CARE_PROVIDER_SITE_OTHER): Payer: Self-pay

## 2023-04-04 DIAGNOSIS — G478 Other sleep disorders: Secondary | ICD-10-CM

## 2023-04-04 DIAGNOSIS — G4700 Insomnia, unspecified: Secondary | ICD-10-CM

## 2023-04-04 MED ORDER — TRAZODONE HCL 50 MG PO TABS: ORAL_TABLET | ORAL | 0 refills | Status: DC

## 2023-04-08 ENCOUNTER — Other Ambulatory Visit: Payer: Self-pay | Admitting: Obstetrics and Gynecology

## 2023-04-08 ENCOUNTER — Other Ambulatory Visit (INDEPENDENT_AMBULATORY_CARE_PROVIDER_SITE_OTHER): Payer: Self-pay | Admitting: Family

## 2023-04-08 DIAGNOSIS — G478 Other sleep disorders: Secondary | ICD-10-CM

## 2023-04-08 DIAGNOSIS — G4700 Insomnia, unspecified: Secondary | ICD-10-CM

## 2023-04-10 DIAGNOSIS — N390 Urinary tract infection, site not specified: Secondary | ICD-10-CM | POA: Diagnosis not present

## 2023-04-10 DIAGNOSIS — K219 Gastro-esophageal reflux disease without esophagitis: Secondary | ICD-10-CM | POA: Diagnosis not present

## 2023-04-10 DIAGNOSIS — J4550 Severe persistent asthma, uncomplicated: Secondary | ICD-10-CM | POA: Diagnosis not present

## 2023-04-10 DIAGNOSIS — J320 Chronic maxillary sinusitis: Secondary | ICD-10-CM | POA: Diagnosis not present

## 2023-04-10 DIAGNOSIS — F5101 Primary insomnia: Secondary | ICD-10-CM | POA: Diagnosis not present

## 2023-04-14 DIAGNOSIS — J4520 Mild intermittent asthma, uncomplicated: Secondary | ICD-10-CM | POA: Diagnosis not present

## 2023-04-15 ENCOUNTER — Telehealth: Payer: Self-pay | Admitting: *Deleted

## 2023-04-15 ENCOUNTER — Telehealth: Payer: Self-pay | Admitting: Allergy and Immunology

## 2023-04-15 MED ORDER — NEBULIZER MISC: 1.0000 | 1 refills | Status: AC | PRN

## 2023-04-15 MED ORDER — NEBULIZER MISC: 1.0000 | 1 refills | Status: DC | PRN

## 2023-04-15 MED ORDER — MEGESTROL ACETATE 40 MG PO TABS: 40.0000 mg | ORAL_TABLET | Freq: Two times a day (BID) | ORAL | 5 refills | Status: AC

## 2023-04-15 MED ORDER — LEVONORGEST-ETH ESTRAD 91-DAY 0.15-0.03 &0.01 MG PO TABS: 1.0000 | ORAL_TABLET | Freq: Every day | ORAL | 4 refills | Status: DC

## 2023-04-15 MED ORDER — PORTABLE COMPRESSOR NEBULIZER MISC: 1.0000 | 1 refills | Status: DC | PRN

## 2023-04-15 MED ORDER — PORTABLE COMPRESSOR NEBULIZER MISC: 1.0000 | 1 refills | Status: AC | PRN

## 2023-04-15 MED ORDER — NEBULIZER MISC: 1 refills | Status: DC

## 2023-04-15 MED ORDER — PORTABLE COMPRESSOR NEBULIZER MISC: 1 refills | Status: DC

## 2023-04-15 NOTE — Addendum Note (Signed)
Addended by: Dub Mikes on: 04/15/2023 10:33 AM   Modules accepted: Orders

## 2023-04-15 NOTE — Addendum Note (Signed)
Addended by: Dollene Cleveland R on: 04/15/2023 10:30 AM   Modules accepted: Orders

## 2023-04-15 NOTE — Addendum Note (Signed)
Addended by: Robet Leu A on: 04/15/2023 10:25 AM   Modules accepted: Orders

## 2023-04-15 NOTE — Telephone Encounter (Signed)
Pt mother call to office stating no pharmacies can get the [place] Brand [facility] in stock.  Now pt is having breakthrough bleeding with generic.  Pt mother ask if there is something that pt may take to stop the bleeding.  Pt mother made aware message to be sent to provider for review and recommendations.    I also called pharmacy this morning to verify if product was available. According to pharmacy, Valetta Fuller is no longer an option for them to order, only generics. Is there another Brand that would be similar for pt use?  Please review and advise.

## 2023-04-15 NOTE — Telephone Encounter (Addendum)
Called and spoke to om and she expressed that the nebulizer at home is no longer working. Mom stated that she also needed portable nebulizer for travel. I informed mom that we can send prescriptions over to [facility] and she'd have to pay the difference. Mom expressed that the person she spoke to at [facility] informed her that it would be no charge.  I did inform mom that whatever insurance didn't cover she'd have to pay. Mom did disconnect the call.  Prescription has been sent to [facility] at fax number [phone]

## 2023-04-15 NOTE — Telephone Encounter (Signed)
Patient's mom requesting a nebulizer for the pt.she requested a call back to explain which one she needs.

## 2023-04-28 DIAGNOSIS — J309 Allergic rhinitis, unspecified: Secondary | ICD-10-CM | POA: Diagnosis not present

## 2023-05-01 ENCOUNTER — Encounter (INDEPENDENT_AMBULATORY_CARE_PROVIDER_SITE_OTHER): Payer: Self-pay | Admitting: Family

## 2023-05-01 ENCOUNTER — Ambulatory Visit (INDEPENDENT_AMBULATORY_CARE_PROVIDER_SITE_OTHER): Payer: 59 | Admitting: Family

## 2023-05-01 VITALS — BP 118/82 | HR 104 | Ht <= 58 in | Wt 156.0 lb

## 2023-05-01 DIAGNOSIS — F840 Autistic disorder: Secondary | ICD-10-CM

## 2023-05-01 DIAGNOSIS — G4700 Insomnia, unspecified: Secondary | ICD-10-CM

## 2023-05-01 DIAGNOSIS — R2689 Other abnormalities of gait and mobility: Secondary | ICD-10-CM

## 2023-05-01 DIAGNOSIS — G40209 Localization-related (focal) (partial) symptomatic epilepsy and epileptic syndromes with complex partial seizures, not intractable, without status epilepticus: Secondary | ICD-10-CM | POA: Diagnosis not present

## 2023-05-01 DIAGNOSIS — G40309 Generalized idiopathic epilepsy and epileptic syndromes, not intractable, without status epilepticus: Secondary | ICD-10-CM | POA: Diagnosis not present

## 2023-05-01 DIAGNOSIS — F819 Developmental disorder of scholastic skills, unspecified: Secondary | ICD-10-CM

## 2023-05-01 DIAGNOSIS — G478 Other sleep disorders: Secondary | ICD-10-CM | POA: Diagnosis not present

## 2023-05-01 DIAGNOSIS — R269 Unspecified abnormalities of gait and mobility: Secondary | ICD-10-CM

## 2023-05-01 MED ORDER — DEPAKOTE 500 MG PO TBEC
DELAYED_RELEASE_TABLET | ORAL | 5 refills | Status: DC
Start: 2023-05-01 — End: 2023-10-28

## 2023-05-01 MED ORDER — TRAZODONE HCL 50 MG PO TABS: ORAL_TABLET | ORAL | 5 refills | Status: DC

## 2023-05-01 NOTE — Patient Instructions (Signed)
It was a pleasure to see you today!  Instructions for you until your next appointment are as follows: Continue Deion's medications as prescribed Let me know if she has any seizures or if you have any concerns Please sign up for MyChart if you have not done so. Please plan to return for follow up in 6 months or sooner if needed.  Feel free to contact our office during normal business hours at [phone] with questions or concerns. If there is no answer or the call is outside business hours, please leave a message and our clinic staff will call you back within the next business day.  If you have an urgent concern, please stay on the line for our after-hours answering service and ask for the on-call neurologist.     I also encourage you to use MyChart to communicate with me more directly. If you have not yet signed up for MyChart within [facility], the front desk staff can help you. However, please note that this inbox is NOT monitored on nights or weekends, and response can take up to 2 business days.  Urgent matters should be discussed with the on-call pediatric neurologist.   At [facility], we are committed to providing exceptional care. You will receive a patient satisfaction survey through text or email regarding your visit today. Your opinion is important to me. Comments are appreciated.

## 2023-05-01 NOTE — Progress Notes (Addendum)
Lori Miles   MRN:  [id number]  1996-01-24   Provider: Elveria Rising NP-C Location of Care: [facility]  Visit type: Return visit  Last visit: 11/14/2022  Referral source: Scifres, [name], PA-C (Inactive) History from: Epic chart and patient's mother  Brief history:  Copied from previous record: Generalized and convulsive epilepsy, intellectual disability, autism spectrum disorder, disordered sleep and gait disorder. She also has asthma and history of frequent urinary tract infections. She is taking and tolerating Depakote for seizures and Trazodone for insomnia   Today's concerns: Mom reports that Lori Miles had a brief partial seizure in the setting of illness but otherwise has remained seizure free since her last visit Mom reports that Lori Miles continues to experience intermittent UTI's and asthma flares.  She has been receiving physical therapy related to her ongoing problems with unsteady gait and limited endurance when walking. Mom reports that her therapist is working on getting her a therapeutic bike and a bath chair. Lori Miles's behavior is variable. She is very self directed and sometimes becomes agitated and restless.  Lori Miles has been otherwise generally healthy since she was last seen. No health concerns today other than previously mentioned.  Review of systems: Please see HPI for neurologic and other pertinent review of systems. Otherwise all other systems were reviewed and were negative.  Problem List: Patient Active Problem List   Diagnosis Date Noted   Weight gain 11/16/2022   Compulsive skin picking 11/14/2022   Poor balance 05/12/2022   Autism 04/30/2022   Well woman exam 04/30/2022   Moderate persistent asthma, uncomplicated 10/09/2021   Vitamin D deficiency 10/09/2021   Encounter for long-term (current) use of high-risk medication 12/29/2020   Menorrhagia 12/16/2020   UTI symptoms 04/10/2020   Renal cyst  11/29/2019   Intertrigo 11/29/2019   Complex care coordination 05/18/2019   Chronic bladder pain 03/03/2019   Screening for cervical cancer 02/26/2019   Glucosuria 06/01/2018   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   Not well controlled moderate persistent asthma 04/29/2018   Gastroesophageal reflux disease 04/29/2018   Recurrent infections 04/29/2018   Dysuria 02/20/2018   Chronic rhinitis 04/03/2017   Recurrent falls 03/21/2017   Weakness 03/21/2017   Venous insufficiency 03/21/2017   OSA on CPAP 09/30/2016   Excessive daytime sleepiness 03/05/2016   Insomnia 10/04/2015   Acute recurrent pansinusitis 07/11/2015   Bronchitis 05/05/2015   Abnormality of gait 03/22/2015   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   Cough variant asthma vs UACS/ vcd  03/15/2015   Pneumonia 02/14/2015   Autism spectrum disorder 02/14/2015   Nausea with vomiting 02/14/2015   Obesity, morbid (HCC) 12/13/2014   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 12/13/2014   Insomnia due to mental disorder 12/13/2014   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   Acanthosis nigricans 11/14/2014   Toeing-in 08/29/2014   Generalized convulsive epilepsy (HCC) 09/28/2012   Partial epilepsy with impairment of consciousness (HCC) 09/28/2012   Cognitive developmental delay 09/28/2012   Recurrent urinary tract infection 08/01/2011   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 if anesthesia  did take antibiotic   Frequency-urgency syndrome    Gait disorder    ORGANIC PER NERUOLGIST NOTE (DR HICKLING)   Generalized convulsive epilepsy (HCC) NEUROLOGIST-  DR HICKLING   GERD (gastroesophageal reflux disease)    Gout    H/O sinus tachycardia    History of acute respiratory failure 02/14/2015   SECONDARY TO CAP   History of recurrent UTIs    Incontinent of feces    Interstitial cystitis    Intertrigo 11/29/2019   Language development disorder    answers yes and no only   Moderate intellectual disability    OSA (obstructive sleep apnea)    no machine yet    Partial epilepsy with impairment of consciousness (HCC)    FOLLOWED BY DR HICKLING   Pneumonia 2    4 times this year ( 2020)   PONV (postoperative nausea and vomiting)    Seizure (HCC)    04/26/22 over 20 years   Urinary incontinence     Past medical history comments: See HPI Copied from previous record: EEG on September 14, 2011, was normal. She was able to be successfully weaned off Keppra. The patient has significant issues with aggression, which was thought to be related to Keppra, but has persisted once it was discontinued. The patient was treated with Depakote both for behavior and possible seizures. She had palpitations and severe constipation on Seroquel. She had marked weight gain on Abilify and Depakote. When generic Divalproex was tried she had breakthrough seizures.      Nocturnal polysomnogram on October 27, 2011, failed to show significant periodic limb movements, cyanosis, sleep apnea, or cardiac arrhythmia.  EEG on February 20, 2012, was a normal study with the patient awake.    EEG performed March 25, 2017 was normal   EEG performed May 21, 2018 was normal  Surgical history: Past Surgical History:  Procedure Laterality Date   CYSTOSCOPY N/A 06/03/2017   Procedure: CYSTOSCOPY WITH EXAM UNDER ANESTHESIA;  Surgeon: Jerilee Field, MD;  Location: [facility];  Service: Urology;  Laterality:  N/A;   CYSTOSCOPY  2020   CYSTOSCOPY WITH HYDRODISTENSION AND BIOPSY  04/14/2012   Procedure: CYSTOSCOPY/BIOPSY/HYDRODISTENSION;  Surgeon: Lindaann Slough, MD;  Location: [facility];  Service: Urology;;  instillation of marcaine and pyridium   EUA/  PAP SMEAR/  BREAST EXAM  03-12-2016   dr Erin Fulling [facility]   EUA/ VAGINOSCOPY/ COLPOSCOPY FO THE HYMENAL RING  10-04-2009    dr Tamela Oddi  [facility]   IR RADIOLOGIST EVAL & MGMT  07/29/2019   IR RADIOLOGIST EVAL & MGMT  09/30/2019   IR SCLEROTHERAPY OF A FLUID COLLECTION  09/08/2019   RADIOLOGY WITH ANESTHESIA N/A 09/08/2019   Procedure: IR Sclerotherapy Treatment;  Surgeon: Radiologist, Medication, MD;  Location: [facility] OR;  Service: Radiology;  Laterality: N/A;   TONSILLECTOMY       Family history: family history includes Pancreatic cancer in her sister; Seizures in her mother and sister.   Social history: Social History   Socioeconomic History   Marital status: Single    Spouse name: Not on file   Number of children: Not on file   Years of education: Not on file   Highest education level: Not on file  Occupational History   Not on file  Tobacco Use   Smoking status:  Never   Smokeless tobacco: Never  Vaping Use   Vaping status: Never Used  Substance and Sexual Activity   Alcohol use: No    Alcohol/week: 0.0 standard drinks of alcohol   Drug use: No   Sexual activity: Never    Birth control/protection: Pill  Other Topics Concern   Not on file  Social History Narrative   Lives with mom ([name] Baillargeon) and half-sister Alaze Garverick) who is also mentally impaired.  Has CAP assistance, urinary incontinence, needs help with feeding,dressing, toileting   Graduated from [employer].  She enjoys playing with cars, dancing, and dogs.   Social Drivers of Corporate investment banker Strain: Not on file  Food Insecurity: Low Risk  (04/28/2023)   Received from [facility]   Hunger Vital Sign    Worried About Running  Out of Food in the Last Year: Never true    Ran Out of Food in the Last Year: Never true  Transportation Needs: No Transportation Needs (04/28/2023)   Received from [facility]    In the past 12 months, has lack of reliable transportation kept you from medical appointments, meetings, work or from getting things needed for daily living? : No  Physical Activity: Not on file  Stress: Not on file  Social Connections: Not on file  Intimate Partner Violence: Not on file    Past/failed meds: Copied from previous record: Levetiracetam - aggressive behavior Divalproex - breakthrough seizures  Allergies: Allergies  Allergen Reactions   Abilify [Aripiprazole] Swelling and Palpitations   Seroquel [Quetiapine Fumarate] Palpitations   Amoxicillin-Pot Clavulanate Hives and Other (See Comments)    Has patient had a PCN reaction causing immediate rash, facial/tongue/throat swelling, SOB or lightheadedness with hypotension: No Has patient had a PCN reaction causing severe rash involving mucus membranes or skin necrosis:    #  #  YES  #  #  Has patient had a PCN reaction that required hospitalization: No Has patient had a PCN reaction occurring within the last 10 years: No    Bactrim [Sulfamethoxazole-Trimethoprim] Other (See Comments)    Abdominal pain   Doxycycline Other (See Comments)    Stomach cramps   Macrobid [Nitrofurantoin Macrocrystal] Other (See Comments)    Sharp pain/ given with Tylenol 325 mg   Keppra [Levetiracetam] Other (See Comments)    Aggressive behavior     Immunizations: Immunization History  Administered Date(s) Administered   DTaP 02/06/1995, 04/30/1995, 07/02/1995, 01/14/1996, 10/10/1998   HIB (PRP-T) 02/06/1995, 04/30/1995, 07/02/1995, 01/14/1996   Hepatitis B, PED/ADOLESCENT 1995/02/08, 02/06/1995, 01/14/1996   IPV 02/06/1995, 04/30/1995, 07/02/1995, 01/14/1996, 10/10/1998   MMR 01/14/1996, 10/10/1998   Pneumococcal Polysaccharide-23 08/06/2016,  03/07/2020   Tdap 03/03/2017   Varicella 01/14/1996   Diagnostics/Screenings: Copied from previous record: 12/15/19 rEEG - This EEG is normal during awake and asleep states. Please note that normal EEG does not exclude epilepsy, clinical correlation is indicated.  Keturah Shavers, MD   03/27/17 - CT head - normal  Physical Exam: BP 118/82   Pulse (!) 104   Ht 4' 9.5" (1.461 m)   Wt 156 lb (70.8 kg)   BMI 33.17 kg/m   Wt Readings from Last 3 Encounters:  05/01/23 156 lb (70.8 kg)  01/24/23 178 lb (80.7 kg)  11/27/22 178 lb (80.7 kg)    General: well developed, well nourished obese young woman, seated on exam table, in no evident distress Head: normocephalic and atraumatic. Oropharynx difficult to examine but appears benign. No  dysmorphic features. Neck: supple Cardiovascular: regular rate and rhythm, no murmurs. Respiratory: clear to auscultation bilaterally Abdomen: bowel sounds present all four quadrants, abdomen soft, non-tender, non-distended. Musculoskeletal: no skeletal deformities or obvious scoliosis. Skin: no rashes or neurocutaneous lesions  Neurologic Exam Mental Status: awake and fully alert. Has limited language. Variable eye contact. Immature behavior. Needs frequent redirection Cranial Nerves: fundoscopic exam - red reflex present.  Unable to fully visualize fundus.  Pupils equal briskly reactive to light.  Turns to localize faces and objects in the periphery. Turns to localize sounds in the periphery. Facial movements are symmetric Motor: normal functional bulk, tone and strength Sensory: withdrawal x 4 Coordination: unable to adequately assess due to patient's inability to participate in examination. No dysmetria with reach for objects. Gait and Station: able to stand and walk but has poor balance and needs support  Impression: Generalized convulsive epilepsy (HCC) - Plan: DEPAKOTE 500 MG DR tablet  Partial epilepsy with impairment of consciousness (HCC) -  Plan: DEPAKOTE 500 MG DR tablet  Insomnia, unspecified type - Plan: traZODone (DESYREL) 50 MG tablet  Sleep arousal disorder - Plan: traZODone (DESYREL) 50 MG tablet  Cognitive developmental delay  Obesity, morbid (HCC)  Autism spectrum disorder  Abnormality of gait  Poor balance    Recommendations for plan of care: The patient's previous Epic records were reviewed. No recent diagnostic studies to be reviewed with the patient. Memorie's seizures are under fairly good control at this time.  Plan until next visit: Continue medications as prescribed  I agree with equipment needs for [facility].  Call if seizures occur or for questions or concerns Return in about 6 months (around 10/29/2023).  The medication list was reviewed and reconciled. No changes were made in the prescribed medications today. A complete medication list was provided to the patient.  Allergies as of 05/01/2023       Reactions   Abilify [aripiprazole] Swelling, Palpitations   Seroquel [quetiapine Fumarate] Palpitations   Amoxicillin-pot Clavulanate Hives, Other (See Comments)   Has patient had a PCN reaction causing immediate rash, facial/tongue/throat swelling, SOB or lightheadedness with hypotension: No Has patient had a PCN reaction causing severe rash involving mucus membranes or skin necrosis:    #  #  YES  #  #  Has patient had a PCN reaction that required hospitalization: No Has patient had a PCN reaction occurring within the last 10 years: No   Bactrim [sulfamethoxazole-trimethoprim] Other (See Comments)   Abdominal pain   Doxycycline Other (See Comments)   Stomach cramps   Macrobid [nitrofurantoin Macrocrystal] Other (See Comments)   Sharp pain/ given with Tylenol 325 mg   Keppra [levetiracetam] Other (See Comments)   Aggressive behavior         Medication List        Accurate as of May 01, 2023 11:59 PM. If you have any questions, ask your nurse or doctor.          acetaminophen 325  MG tablet Commonly known as: TYLENOL Take 2 tablets (650 mg total) by mouth every 6 (six) hours as needed for mild pain or moderate pain.   albuterol (2.5 MG/3ML) 0.083% nebulizer solution Commonly known as: PROVENTIL Take 3 mLs (2.5 mg total) by nebulization every 4 (four) hours as needed for wheezing or shortness of breath.   cefUROXime 500 MG tablet Commonly known as: CEFTIN Take 500 mg by mouth 2 (two) times daily.   cetirizine 10 MG tablet Commonly known as: ZYRTEC Take 1 tablet (  10 mg total) by mouth 2 (two) times daily as needed for allergies (Can use an extra dose during flare ups).   Depakote 500 MG DR tablet Generic drug: divalproex TAKE 1 TABLET BY MOUTH IN THE MORNING AND 2 AT NIGHT   famotidine 20 MG tablet Commonly known as: PEPCID TAKE 2 TABLET(40 MG) BY MOUTH EVERY AM AND TAKE 1-2 TABS EVERY PM AS NEEDED.   FeroSul 325 (65 Fe) MG tablet Generic drug: ferrous sulfate Take 325 mg by mouth daily with breakfast.   fluconazole 150 MG tablet Commonly known as: DIFLUCAN Take 1 tablet (150 mg total) by mouth as needed (when on antibiotics).   formoterol 20 MCG/2ML nebulizer solution Commonly known as: Perforomist Take 2 mLs (20 mcg total) by nebulization 2 (two) times daily. USE 1 VIAL VIA NEBULIZER TWICE DAILY   hydrOXYzine 25 MG tablet Commonly known as: ATARAX Take 25 mg by mouth 3 (three) times daily as needed (pain).   ipratropium-albuterol 0.5-2.5 (3) MG/3ML Soln Commonly known as: DUONEB Take 3 mLs by nebulization every 4 (four) hours as needed.   megestrol 40 MG tablet Commonly known as: MEGACE Take 1 tablet (40 mg total) by mouth 2 (two) times daily. Can increase to two tablets twice a day in the event of heavy bleeding   melatonin 3 MG Tabs tablet Take 2 tablets (6mg ) by mouth at bedtime. What changed:  how much to take how to take this when to take this additional instructions   montelukast 10 MG tablet Commonly known as: SINGULAIR Take 1  tablet (10 mg total) by mouth at bedtime. TAKE 1 TABLET(10 MG) BY MOUTH AT BEDTIME   Nebulizer Misc Adult mask with tubing. Use as directed with nebulizer device.   Portable Compressor Nebulizer Misc 1 kit by Does not apply route as needed.   Nebulizer Misc 1 kit by Does not apply route as needed.   ondansetron 4 MG disintegrating tablet Commonly known as: Zofran ODT Take 2 tablets (8 mg total) by mouth every 8 (eight) hours as needed for nausea or vomiting.   polyethylene glycol powder 17 GM/SCOOP powder Commonly known as: GLYCOLAX/MIRALAX DISSOLVE 1 CAPFUL(17 GRAMS) IN AT LEAST 8 OUNCES OF WATER/JUICE TWICE DAILY   Pulmicort 1 MG/2ML nebulizer solution Generic drug: budesonide Take 2 mLs (1 mg total) by nebulization in the morning, at noon, in the evening, and at bedtime. USE 1 VIAL VIA NEBULIZER FOUR TIMES DAILY DURING RESPITORY INFECTIONS AND ASTHMA   Seasonique 0.15-0.03 &0.01 MG tablet Generic drug: Levonorgestrel-Ethinyl Estradiol Take 1 tablet by mouth daily.   Levonorgestrel-Ethinyl Estradiol 0.15-0.03 &0.01 MG tablet Commonly known as: AMETHIA Take 1 tablet by mouth daily.   senna 8.6 MG tablet Commonly known as: SENOKOT Take 1 tablet by mouth at bedtime.   terconazole 0.4 % vaginal cream Commonly known as: TERAZOL 7 Place 1 applicator vaginally at bedtime.   terconazole 0.8 % vaginal cream Commonly known as: TERAZOL 3 In pull-up alternate every other week with A&D   traZODone 50 MG tablet Commonly known as: DESYREL TAKE 2 TABLETS(100 MG) BY MOUTH AT BEDTIME   vitamin A & D ointment Apply 1 Application topically See admin instructions. Put In pull up every other week alternate with Terconazole   Vitamin D3 1.25 MG (50000 UT) Caps Take 1 capsule by mouth once a week.      Total time spent with the patient was 30 minutes, of which 50% or more was spent in counseling and coordination of care.  Inetta Fermo  Edina Winningham NP-C [facility] [address], [address] [place], [place] [address] Ph. [phone] Fax [phone]

## 2023-05-02 ENCOUNTER — Encounter (INDEPENDENT_AMBULATORY_CARE_PROVIDER_SITE_OTHER): Payer: Self-pay | Admitting: Family

## 2023-05-12 MED ORDER — NEBULIZER MISC: 1 refills | Status: AC

## 2023-05-12 NOTE — Telephone Encounter (Signed)
 Mom called requesting to speak to me stating she is frustrated with our nurses. Mom is requesting for a compact nebulizer that [name]'s nurse can take with them when they are out. She said the one she has is blue and compact and also still has nebulizer tubbing/mask. She asked me to get in contact with [name] with [employer]. [name] states the prescription is to be sent in with the following in the comments: Nebulizer with administrative kit and filters. If this is not in the comments insurance will not cover. I had [name] put the prescription in and [name] stamp has been placed on the updated prescription.  Prescription, notes and demographics have been faxed to [phone]. I emailed [name] to see if she received the request. Waiting for an updated. I informed mom I will call her if I can an update.

## 2023-05-12 NOTE — Addendum Note (Signed)
 Addended by: Ralene Muskrat on: 05/12/2023 03:48 PM   Modules accepted: Orders

## 2023-05-20 DIAGNOSIS — J4520 Mild intermittent asthma, uncomplicated: Secondary | ICD-10-CM | POA: Diagnosis not present

## 2023-05-23 DIAGNOSIS — U071 COVID-19: Secondary | ICD-10-CM | POA: Diagnosis not present

## 2023-05-23 DIAGNOSIS — J029 Acute pharyngitis, unspecified: Secondary | ICD-10-CM | POA: Diagnosis not present

## 2023-05-23 DIAGNOSIS — R350 Frequency of micturition: Secondary | ICD-10-CM | POA: Diagnosis not present

## 2023-05-23 DIAGNOSIS — J069 Acute upper respiratory infection, unspecified: Secondary | ICD-10-CM | POA: Diagnosis not present

## 2023-05-26 ENCOUNTER — Telehealth: Payer: Self-pay | Admitting: *Deleted

## 2023-05-26 NOTE — Telephone Encounter (Signed)
 Called and spoke with patient's mother, Lori Miles (DPR), regarding CPAP machine.  She states she has not been wearing the CPAP machine because they have not moved and everything is boxed up and there is no room for the machine.  She uses [facility] for the CPAP machine.  The patient also tested positive for Covid on 05/23/23 at [facility].  Patient's mother did not believe she had Covid, she said it was just sinus and her Asthma like normal for her.  She was given medication by her PCP. I let her know I would make Dr. Celine Mans aware of need for reschedule.  Rescheduled her visit for 07/01/2023 at 9:45 am, advised to arrive by 9:30 am for check in .  She verbalized understanding.  Dr. Celine Mans, this is just an FYI.  Thank you.

## 2023-05-27 ENCOUNTER — Ambulatory Visit: Payer: 59 | Admitting: Internal Medicine

## 2023-06-03 DIAGNOSIS — L700 Acne vulgaris: Secondary | ICD-10-CM | POA: Diagnosis not present

## 2023-06-03 DIAGNOSIS — L810 Postinflammatory hyperpigmentation: Secondary | ICD-10-CM | POA: Diagnosis not present

## 2023-06-03 DIAGNOSIS — L249 Irritant contact dermatitis, unspecified cause: Secondary | ICD-10-CM | POA: Diagnosis not present

## 2023-06-12 ENCOUNTER — Other Ambulatory Visit: Payer: Self-pay | Admitting: Allergy and Immunology

## 2023-07-01 ENCOUNTER — Ambulatory Visit (INDEPENDENT_AMBULATORY_CARE_PROVIDER_SITE_OTHER): Admitting: Internal Medicine

## 2023-07-01 ENCOUNTER — Encounter: Payer: Self-pay | Admitting: Internal Medicine

## 2023-07-01 VITALS — BP 106/70 | HR 104 | Ht <= 58 in | Wt 154.0 lb

## 2023-07-01 DIAGNOSIS — K219 Gastro-esophageal reflux disease without esophagitis: Secondary | ICD-10-CM

## 2023-07-01 DIAGNOSIS — J328 Other chronic sinusitis: Secondary | ICD-10-CM

## 2023-07-01 DIAGNOSIS — J45909 Unspecified asthma, uncomplicated: Secondary | ICD-10-CM | POA: Diagnosis not present

## 2023-07-01 MED ORDER — AZELASTINE HCL 0.1 % NA SOLN: 1.0000 | Freq: Two times a day (BID) | NASAL | 12 refills | Status: AC

## 2023-07-01 MED ORDER — IPRATROPIUM BROMIDE 0.03 % NA SOLN: 2.0000 | Freq: Two times a day (BID) | NASAL | 11 refills | Status: AC

## 2023-07-01 NOTE — Progress Notes (Signed)
 Lori Miles    [id number]    11-01-1995  Primary Care Physician:Scifres, Peter Minium (Inactive) Date of Appointment: 07/01/2023 Established Patient Visit  Chief complaint:   Chief Complaint  Patient presents with   Follow-up    Patient asthma is out of control and she is having sinus problem. P     HPI: Lori Miles is a 28 y.o. woman with autism, nonverbal and dyspnea and cough suspectd to be asthma with rhinitis and reflux.  Ollie has severe developmental delay, autism and seizure disorder. She has significant limitations with her ADLs and is full assist with daily caregivers/nurses. Frequent UTIs.  Allergist - Dr. Lucie Leather for reflux and rhinitis.   Interval Updates: Here for follow up. Not using CPAP. Here for concerns with Sinus issues.  Recent UTI treated with Keflex.  Says that her nose swells up when she has a UTI.   Current sinus treatment - ayr gel twice daily.  She can't do nasal sprays - she does have ipratropium nasal spray.  Taking montelukast and cetirizine.   Received prednisone 2-3 weeks ago - for asthma exacerbation.  Childhood asthma - current therapy budesonide and formoterol twice daily.    I have reviewed the patient's family social and past medical history and updated as appropriate.   Past Medical History:  Diagnosis Date   Asthma    Autism disorder    Bacteriuria    Chronic constipation    Cognitive developmental delay    Constipation    COVID-19 03/2020   Disruptive behavior disorder    Dyspnea    if she walks much   Family history of adverse reaction to anesthesia     "hives"-? if anesthesia did take antibiotic   Frequency-urgency syndrome    Gait disorder    ORGANIC PER NERUOLGIST NOTE (DR HICKLING)   Generalized convulsive epilepsy (HCC) NEUROLOGIST-  DR HICKLING   GERD (gastroesophageal reflux disease)    Gout    H/O sinus tachycardia    History of acute respiratory failure 02/14/2015   SECONDARY TO CAP    History of recurrent UTIs    Incontinent of feces    Interstitial cystitis    Intertrigo 11/29/2019   Language development disorder    answers yes and no only   Moderate intellectual disability    OSA (obstructive sleep apnea)    no machine yet    Partial epilepsy with impairment of consciousness (HCC)    FOLLOWED BY DR HICKLING   Pneumonia 2    4 times this year ( 2020)   PONV (postoperative nausea and vomiting)    Seizure (HCC)    04/26/22 over 20 years   Urinary incontinence     Past Surgical History:  Procedure Laterality Date   CYSTOSCOPY N/A 06/03/2017   Procedure: CYSTOSCOPY WITH EXAM UNDER ANESTHESIA;  Surgeon: Jerilee Field, MD;  Location: [facility];  Service: Urology;  Laterality: N/A;   CYSTOSCOPY  2020   CYSTOSCOPY WITH HYDRODISTENSION AND BIOPSY  04/14/2012   Procedure: CYSTOSCOPY/BIOPSY/HYDRODISTENSION;  Surgeon: Lindaann Slough, MD;  Location: [facility];  Service: Urology;;  instillation of marcaine and pyridium   EUA/  PAP SMEAR/  BREAST EXAM  03-12-2016   dr Erin Fulling [facility]   EUA/ VAGINOSCOPY/ COLPOSCOPY FO THE HYMENAL RING  10-04-2009    dr Tamela Oddi  [facility]   IR RADIOLOGIST EVAL & MGMT  07/29/2019   IR RADIOLOGIST EVAL & MGMT  09/30/2019  IR SCLEROTHERAPY OF A FLUID COLLECTION  09/08/2019   RADIOLOGY WITH ANESTHESIA N/A 09/08/2019   Procedure: IR Sclerotherapy Treatment;  Surgeon: Radiologist, Medication, MD;  Location: [facility] OR;  Service: Radiology;  Laterality: N/A;   TONSILLECTOMY      Family History  Problem Relation Age of Onset   Seizures Mother    Seizures Sister        1 sister has   Pancreatic cancer Sister    Colon cancer Neg Hx    Colon polyps Neg Hx    Esophageal cancer Neg Hx    Rectal cancer Neg Hx    Stomach cancer Neg Hx     Social History   Occupational History   Not on file  Tobacco Use   Smoking status: Never   Smokeless tobacco: Never  Vaping Use   Vaping status: Never Used  Substance and  Sexual Activity   Alcohol use: No    Alcohol/week: 0.0 standard drinks of alcohol   Drug use: No   Sexual activity: Never    Birth control/protection: Pill     Physical Exam: Blood pressure 106/70, pulse (!) 104, height 4\' 9"  (1.448 m), weight 154 lb (69.9 kg), SpO2 100%.  Gen:      No distress, non verbal, cooperative ENT: slight nasal deviation to the left, no polyps Lungs:  no respiratory distress CV:        tachycardic, regular   Data Reviewed: Imaging: I have personally reviewed the chest xray June 2023  CT sinus Jan 2022 - normal CT sinus  PFTs:      No data to display         I have personally reviewed the patient's PFTs and spirometry July 2022 difficult to interpret.    Labs: Lab Results  Component Value Date   NA 138 11/14/2021   K 3.4 (L) 11/14/2021   CO2 24 11/14/2021   GLUCOSE 109 (H) 11/14/2021   BUN 16 11/14/2021   CREATININE 1.06 (H) 11/14/2021   CALCIUM 7.9 (L) 11/14/2021   GFRNONAA >60 11/14/2021   Lab Results  Component Value Date   WBC 17.8 (H) 11/14/2021   HGB 12.6 11/14/2021   HCT 38.2 11/14/2021   MCV 94.1 11/14/2021   PLT 282 11/14/2021    Immunization status: Immunization History  Administered Date(s) Administered   DTaP 02/06/1995, 04/30/1995, 07/02/1995, 01/14/1996, 10/10/1998   HIB (PRP-T) 02/06/1995, 04/30/1995, 07/02/1995, 01/14/1996   Hepatitis B, PED/ADOLESCENT [birth year]-1994, 02/06/1995, 01/14/1996   IPV 02/06/1995, 04/30/1995, 07/02/1995, 01/14/1996, 10/10/1998   MMR 01/14/1996, 10/10/1998   Pneumococcal Polysaccharide-23 08/06/2016, 03/07/2020   Tdap 03/03/2017   Varicella 01/14/1996    External Records Personally Reviewed: allergy, pulmonary, urology  Assessment:  Moderate persistent asthma, well controlled Rhinitis, chronic, not well controlled GERD, controlled OSA, not on cpap   Plan/Recommendations:  Let me know when you start using CPAP again and we can follow up on this.   For asthma Continue  budesonide and formoterol twice daily.  Continue the duonebs as needed for shortness of breath  For sinus drainage and congestion Continue singulair, zyrtec saline gel for dryness.  Start using astelin nasal spray 1 spray each side of the nose daily Can also use ipratropium nasal spray up to 4 times a day as needed  Continue pepcid for acid reflux.    Return to Care: Return in about 6 months (around 12/31/2023).\  Durel Salts, MD Pulmonary and Critical Care Medicine [facility] [phone]

## 2023-07-01 NOTE — Patient Instructions (Addendum)
 It was a pleasure to see you today!  Please schedule follow up scheduled with myself in 6 months.  If my schedule is not open yet, we will contact you with a reminder closer to that time. Please call [phone] if you haven't heard from us  a month before, and always call us  sooner if issues or concerns arise. You can also send us  a message through MyChart, but but aware that this is not to be used for urgent issues and it may take up to 5-7 days to receive a reply. Please be aware that you will likely be able to view your results before I have a chance to respond to them. Please give us  5 business days to respond to any non-urgent results.    Let me know when you start using CPAP again and we can follow up on this.   For asthma Continue budesonide and formoterol twice daily.  Continue the duonebs as needed for shortness of breath  For sinus drainage and congestion Continue singulair, zyrtec saline gel for dryness.  Start using astelin nasal spray 1 spray each side of the nose daily Can also use ipratropium nasal spray up to 4 times a day as needed  Continue pepcid for acid reflux.

## 2023-07-02 DIAGNOSIS — J4520 Mild intermittent asthma, uncomplicated: Secondary | ICD-10-CM | POA: Diagnosis not present

## 2023-07-08 ENCOUNTER — Other Ambulatory Visit (INDEPENDENT_AMBULATORY_CARE_PROVIDER_SITE_OTHER): Payer: Self-pay | Admitting: Family

## 2023-07-08 DIAGNOSIS — R296 Repeated falls: Secondary | ICD-10-CM

## 2023-07-08 DIAGNOSIS — R269 Unspecified abnormalities of gait and mobility: Secondary | ICD-10-CM

## 2023-07-08 DIAGNOSIS — R2689 Other abnormalities of gait and mobility: Secondary | ICD-10-CM

## 2023-07-29 DIAGNOSIS — L304 Erythema intertrigo: Secondary | ICD-10-CM | POA: Diagnosis not present

## 2023-07-29 DIAGNOSIS — D500 Iron deficiency anemia secondary to blood loss (chronic): Secondary | ICD-10-CM | POA: Diagnosis not present

## 2023-07-29 DIAGNOSIS — Z0000 Encounter for general adult medical examination without abnormal findings: Secondary | ICD-10-CM | POA: Diagnosis not present

## 2023-07-29 DIAGNOSIS — Z79899 Other long term (current) drug therapy: Secondary | ICD-10-CM | POA: Diagnosis not present

## 2023-07-29 DIAGNOSIS — K5909 Other constipation: Secondary | ICD-10-CM | POA: Diagnosis not present

## 2023-07-29 DIAGNOSIS — N390 Urinary tract infection, site not specified: Secondary | ICD-10-CM | POA: Diagnosis not present

## 2023-07-29 DIAGNOSIS — E559 Vitamin D deficiency, unspecified: Secondary | ICD-10-CM | POA: Diagnosis not present

## 2023-07-29 DIAGNOSIS — J4550 Severe persistent asthma, uncomplicated: Secondary | ICD-10-CM | POA: Diagnosis not present

## 2023-07-29 DIAGNOSIS — J320 Chronic maxillary sinusitis: Secondary | ICD-10-CM | POA: Diagnosis not present

## 2023-07-31 DIAGNOSIS — Z136 Encounter for screening for cardiovascular disorders: Secondary | ICD-10-CM | POA: Diagnosis not present

## 2023-07-31 DIAGNOSIS — Z13220 Encounter for screening for lipoid disorders: Secondary | ICD-10-CM | POA: Diagnosis not present

## 2023-08-01 ENCOUNTER — Other Ambulatory Visit: Payer: Self-pay | Admitting: Allergy and Immunology

## 2023-08-05 ENCOUNTER — Telehealth: Payer: Self-pay | Admitting: Allergy and Immunology

## 2023-08-05 NOTE — Telephone Encounter (Signed)
 Mother of patient called and stated that Albuterol  was called in for her daughter, but that is not what she needed, and that what she needed is Pulmicort  and Performist. She states that either a Benita or Athena Bland has sent something to our office from [facility] on [address]. Best contact number [phone]

## 2023-08-05 NOTE — Telephone Encounter (Signed)
 Called patient's mother, Lori Miles - DOB/NEED Updated DPR -   Last seen: 12/10/22  return 6 months no appt has been scheduled   Mom stated when she last spoke to provider, she advised him she would be having surgery - didn't know when she would be able to bring patient in due to recovery.   Mom also stated she had advised someone at the office before as well - no documentation in patient's chart.  Mom stated she has already had surgery but is still not moving around good - tried to make appointment but mom stated she didn't have patient's calendar w/her - will contact office tomorrow, Thursday, 08/06/23 to schedule office visit.  Mom advised a courtesy medication refill on Budesonide  (Pulmicort ) and Formoterol  ( Performist) would be sent to [facility] and message would be forwarded to provider for next step with appointment necessity.  Mom stated she had just received text stating prescriptions had been received by pharmacy and in the process of being filled - I advised they were approved via surescripts but I would forward message as well to provider.  Mom verbalized understanding to all, no further questions.

## 2023-08-06 MED ORDER — FORMOTEROL FUMARATE 20 MCG/2ML IN NEBU: 20.0000 ug | INHALATION_SOLUTION | Freq: Two times a day (BID) | RESPIRATORY_TRACT | 0 refills | Status: DC

## 2023-08-06 MED ORDER — BUDESONIDE 1 MG/2ML IN SUSP: 1.0000 mg | Freq: Four times a day (QID) | RESPIRATORY_TRACT | 0 refills | Status: DC

## 2023-08-06 NOTE — Telephone Encounter (Signed)
 Patients mom/guardian called back and informed Herminia Lope that she would like to speak with the office manager or Moira Andrews. I called and mom asked if it would be okay to call me back tomorrow 08/07/23. I informed mom that would be fine.

## 2023-08-06 NOTE — Telephone Encounter (Signed)
 Per provider:  Needs appointment   Patient's mother, Acquanette called back in - DOB/NEED Updated DPR - advised of provider notation above.  Mom stated medications were not sent in yesterday (08/05/23) even though her text stated they were - requesting refills.  Mom stated she had back surgery 04/23rd - having major issues since surgery - falling a lot- in physical therapy as well- no transportation/difficulty obtaining transportation.  I asked mom to look at patient's calendar to schedule office visit -   Scheduled office visit: 08/25/23  3 pm w/Anne.  Mom advised updated message would be forwarded to provider for approval to send courtesy refill.  Mom verbalized understanding to all - stated her paperwork shows provider stated follow up as needed - I reiterated 12/10/22 office notes states -  Return to clinic 6 months or earlier if problem.

## 2023-08-06 NOTE — Telephone Encounter (Signed)
 Per Provider:  Refill meds    Courtesy medication refills sent to [facility].

## 2023-08-12 DIAGNOSIS — J4520 Mild intermittent asthma, uncomplicated: Secondary | ICD-10-CM | POA: Diagnosis not present

## 2023-08-24 ENCOUNTER — Other Ambulatory Visit: Payer: Self-pay | Admitting: Allergy and Immunology

## 2023-08-24 NOTE — Progress Notes (Deleted)
   [address] [place] [address] Dept: [phone]  FOLLOW UP NOTE  Patient ID: Lori Miles, female    DOB: 07/15/1995  Age: 28 y.o. MRN: [id number] Date of Office Visit: 08/25/2023  Assessment  Chief Complaint: No chief complaint on file.  HPI Lori Miles is a 28 year old female who presents to the clinic for follow-up visit.  She was last seen in this clinic on 12/10/2022 by Dr. Kozlow for evaluation of asthma, allergic rhinitis, and reflux.  Discussed the use of AI scribe software for clinical note transcription with the patient, who gave verbal consent to proceed.  History of Present Illness      Drug Allergies:  Allergies  Allergen Reactions   Abilify  [Aripiprazole ] Swelling and Palpitations   Seroquel [Quetiapine Fumarate] Palpitations   Amoxicillin -Pot Clavulanate Hives and Other (See Comments)    Has patient had a PCN reaction causing immediate rash, facial/tongue/throat swelling, SOB or lightheadedness with hypotension: No Has patient had a PCN reaction causing severe rash involving mucus membranes or skin necrosis:    #  #  YES  #  #  Has patient had a PCN reaction that required hospitalization: No Has patient had a PCN reaction occurring within the last 10 years: No    Bactrim  [Sulfamethoxazole -Trimethoprim ] Other (See Comments)    Abdominal pain   Doxycycline  Other (See Comments)    Stomach cramps   Macrobid  [Nitrofurantoin  Macrocrystal] Other (See Comments)    Sharp pain/ given with Tylenol  325 mg   Keppra [Levetiracetam] Other (See Comments)    Aggressive behavior     Physical Exam: There were no vitals taken for this visit.   Physical Exam  Diagnostics:    Assessment and Plan: No diagnosis found.  No orders of the defined types were placed in this encounter.   There are no Patient Instructions on file for this visit.  No follow-ups on file.    Thank you for the opportunity to care for this patient.  Please do not hesitate  to contact me with questions.  Marinus Sic, FNP [facility]

## 2023-08-24 NOTE — Patient Instructions (Incomplete)
  1. Continue a combination of the following:   A. budesonide 1 mg nebulized twice a day  B. Perforomist nebulized twice a day  C. Nasal saline / gel 2 times per day  D. montelukast 10 mg daily  E. cetirizine 10 mg daily  2. Continue to Treat reflux with a combination of the following:   A. Famotidine 40mg  twice a day  B. No caffeine or chocolate consumption   3. "Action plan" for asthma flare up:   A. Increase budesonide to 4 times a day  B. Albuterol or DUONEB nebulization every 4-6 hours if needed  4. If needed:   A. Cetirizine 10 mg - 1 tablet 1 time per day  5. Return to clinic 6 months or earlier if problem.   6. Obtain fall flu vaccine

## 2023-08-25 ENCOUNTER — Telehealth: Payer: Self-pay

## 2023-08-25 ENCOUNTER — Ambulatory Visit: Admitting: Family Medicine

## 2023-08-25 NOTE — Telephone Encounter (Signed)
 Patient's mother, Lori Miles called in - DOB/NEED DPR verified - stated she may not be able to bring patient in due to her having kidney infection - pt is acting out and mom is having a hard time with her due to mom's back surgery.  Reviewed 08/10/23 telephone encounter w/mom.  Rescheduled today's appt to 09/01/23 @ 2:40 pm w/Anne.

## 2023-08-31 NOTE — Patient Instructions (Incomplete)
  1. Continue a combination of the following:   A. budesonide  1 mg nebulized twice a day  B. Perforomist  nebulized twice a day  C. Nasal saline / gel 2 times per day  D. montelukast  10 mg daily  E. cetirizine  10 mg daily  2. Continue to Treat reflux with a combination of the following:   A. Follow up with your GI provider  B. No caffeine or chocolate consumption   3. Action plan for asthma flare up:   A. Increase budesonide  to 4 times a day  B. Albuterol  or DUONEB nebulization every 4-6 hours if needed  4. If needed:   A. Cetirizine  10 mg - 1 tablet 1 time per day  5. Return to clinic 6 months or earlier if problem.

## 2023-08-31 NOTE — Progress Notes (Unsigned)
   [address] [place] [address] Dept: [phone]  FOLLOW UP NOTE  Patient ID: Lori Miles, female    DOB: 1995-06-27  Age: 28 y.o. MRN: [id number] Date of Office Visit: 09/01/2023  Assessment  Chief Complaint: No chief complaint on file.  HPI Lori Miles is a 28 year old female who presents to the clinic for follow-up visit.  She was last seen in this clinic on 12/10/2022 by Dr. Kozlow for evaluation of asthma, allergic rhinitis, and reflux.  Discussed the use of AI scribe software for clinical note transcription with the patient, who gave verbal consent to proceed.  History of Present Illness      Drug Allergies:  Allergies  Allergen Reactions   Abilify  [Aripiprazole ] Swelling and Palpitations   Seroquel [Quetiapine Fumarate] Palpitations   Amoxicillin -Pot Clavulanate Hives and Other (See Comments)    Has patient had a PCN reaction causing immediate rash, facial/tongue/throat swelling, SOB or lightheadedness with hypotension: No Has patient had a PCN reaction causing severe rash involving mucus membranes or skin necrosis:    #  #  YES  #  #  Has patient had a PCN reaction that required hospitalization: No Has patient had a PCN reaction occurring within the last 10 years: No    Bactrim  [Sulfamethoxazole -Trimethoprim ] Other (See Comments)    Abdominal pain   Doxycycline  Other (See Comments)    Stomach cramps   Macrobid  [Nitrofurantoin  Macrocrystal] Other (See Comments)    Sharp pain/ given with Tylenol  325 mg   Keppra [Levetiracetam] Other (See Comments)    Aggressive behavior     Physical Exam: There were no vitals taken for this visit.   Physical Exam  Diagnostics:    Assessment and Plan: No diagnosis found.  No orders of the defined types were placed in this encounter.   There are no Patient Instructions on file for this visit.  No follow-ups on file.    Thank you for the opportunity to care for this patient.  Please do not hesitate  to contact me with questions.  Marinus Sic, FNP [facility]

## 2023-09-01 ENCOUNTER — Telehealth: Payer: Self-pay | Admitting: Allergy and Immunology

## 2023-09-01 ENCOUNTER — Encounter: Payer: Self-pay | Admitting: Family Medicine

## 2023-09-01 ENCOUNTER — Ambulatory Visit (INDEPENDENT_AMBULATORY_CARE_PROVIDER_SITE_OTHER): Admitting: Family Medicine

## 2023-09-01 VITALS — BP 124/82 | HR 112 | Temp 98.2°F | Resp 18

## 2023-09-01 DIAGNOSIS — K219 Gastro-esophageal reflux disease without esophagitis: Secondary | ICD-10-CM

## 2023-09-01 DIAGNOSIS — J4550 Severe persistent asthma, uncomplicated: Secondary | ICD-10-CM | POA: Insufficient documentation

## 2023-09-01 DIAGNOSIS — J3089 Other allergic rhinitis: Secondary | ICD-10-CM | POA: Diagnosis not present

## 2023-09-01 MED ORDER — IPRATROPIUM-ALBUTEROL 0.5-2.5 (3) MG/3ML IN SOLN: RESPIRATORY_TRACT | 1 refills | Status: AC

## 2023-09-01 MED ORDER — BUDESONIDE 1 MG/2ML IN SUSP: 1.0000 mg | Freq: Two times a day (BID) | RESPIRATORY_TRACT | 3 refills | Status: DC

## 2023-09-01 MED ORDER — MONTELUKAST SODIUM 10 MG PO TABS: 10.0000 mg | ORAL_TABLET | Freq: Every day | ORAL | 5 refills | Status: DC

## 2023-09-01 MED ORDER — FORMOTEROL FUMARATE 20 MCG/2ML IN NEBU: 20.0000 ug | INHALATION_SOLUTION | Freq: Two times a day (BID) | RESPIRATORY_TRACT | 2 refills | Status: DC

## 2023-09-01 NOTE — Telephone Encounter (Signed)
 Famotidine  has been removed from the AVS. Please send out new AVS. Thank you

## 2023-09-01 NOTE — Telephone Encounter (Signed)
 Patients mom called soon after she left office from visit with Marinus Sic. She stated she wanted a nurse to call her back. I asked her if everything was ok and she stated that lady has famotadine on my daughters paperwork and we do not get this medication from you all we get it from her gas doctor. I kept telling her that we only get asthma medications from [facility] and not the famotadine. How many times do I have to repeat myself about this. She was not listening to me. I let patient know that I will let the nurse know that she is getting that medication from a different provider and that patient only needs asthma medication from us .

## 2023-09-15 DIAGNOSIS — J4520 Mild intermittent asthma, uncomplicated: Secondary | ICD-10-CM | POA: Diagnosis not present

## 2023-09-23 DIAGNOSIS — N390 Urinary tract infection, site not specified: Secondary | ICD-10-CM | POA: Diagnosis not present

## 2023-09-29 ENCOUNTER — Encounter: Payer: Self-pay | Admitting: Physical Therapy

## 2023-09-29 ENCOUNTER — Ambulatory Visit: Attending: Family | Admitting: Physical Therapy

## 2023-09-29 DIAGNOSIS — M6281 Muscle weakness (generalized): Secondary | ICD-10-CM | POA: Diagnosis not present

## 2023-09-29 DIAGNOSIS — R2689 Other abnormalities of gait and mobility: Secondary | ICD-10-CM | POA: Insufficient documentation

## 2023-09-29 DIAGNOSIS — R2681 Unsteadiness on feet: Secondary | ICD-10-CM | POA: Diagnosis not present

## 2023-09-29 DIAGNOSIS — R269 Unspecified abnormalities of gait and mobility: Secondary | ICD-10-CM | POA: Insufficient documentation

## 2023-09-29 DIAGNOSIS — R296 Repeated falls: Secondary | ICD-10-CM | POA: Diagnosis not present

## 2023-09-29 NOTE — Therapy (Unsigned)
 OUTPATIENT PHYSICAL THERAPY NEURO EVALUATION   Patient Name: Lori Miles MRN: [id number] DOB:07/23/1995, 28 y.o., female Today's Date: 09/30/2023   PCP: Reinaldo Naomie RIGGERS REFERRING PROVIDER: [place], NP    PT End of Session - 09/30/23 2101     Visit Number 1    Number of Visits 7   6 visits + eval   Date for PT Re-Evaluation 11/24/23   pushed out to allow for scheduling delays   Authorization Type [facility] DUAL    Authorization Time Period NO auth required    PT Start Time 0758    PT Stop Time 0845    PT Time Calculation (min) 47 min    Activity Tolerance Patient tolerated treatment well    Behavior During Therapy [facility] for tasks assessed/performed             Past Medical History:  Diagnosis Date   Asthma    Autism disorder    Bacteriuria    Chronic constipation    Cognitive developmental delay    Constipation    COVID-19 03/2020   Disruptive behavior disorder    Dyspnea    if she walks much   Family history of adverse reaction to anesthesia     hives-? if anesthesia did take antibiotic   Frequency-urgency syndrome    Gait disorder    ORGANIC PER NERUOLGIST NOTE (DR HICKLING)   Generalized convulsive epilepsy (HCC) NEUROLOGIST-  DR HICKLING   GERD (gastroesophageal reflux disease)    Gout    H/O sinus tachycardia    History of acute respiratory failure 02/14/2015   SECONDARY TO CAP   History of recurrent UTIs    Incontinent of feces    Interstitial cystitis    Intertrigo 11/29/2019   Language development disorder    answers yes and no only   Moderate intellectual disability    OSA (obstructive sleep apnea)    no machine yet    Partial epilepsy with impairment of consciousness (HCC)    FOLLOWED BY DR HICKLING   Pneumonia 2    4 times this year ( 2020)   PONV (postoperative nausea and vomiting)    Seizure (HCC)    04/26/22 over 20 years   Urinary incontinence    Past Surgical History:  Procedure Laterality Date    CYSTOSCOPY N/A 06/03/2017   Procedure: CYSTOSCOPY WITH EXAM UNDER ANESTHESIA;  Surgeon: Nieves Cough, MD;  Location: [facility];  Service: Urology;  Laterality: N/A;   CYSTOSCOPY  2020   CYSTOSCOPY WITH HYDRODISTENSION AND BIOPSY  04/14/2012   Procedure: CYSTOSCOPY/BIOPSY/HYDRODISTENSION;  Surgeon: Thomasine Oiler, MD;  Location: [facility];  Service: Urology;;  instillation of marcaine  and pyridium    EUA/  PAP SMEAR/  BREAST EXAM  03-12-2016   dr corene [facility]   EUA/ VAGINOSCOPY/ COLPOSCOPY FO THE HYMENAL RING  10-04-2009    dr rogelio  [facility]   IR RADIOLOGIST EVAL & MGMT  07/29/2019   IR RADIOLOGIST EVAL & MGMT  09/30/2019   IR SCLEROTHERAPY OF A FLUID COLLECTION  09/08/2019   RADIOLOGY WITH ANESTHESIA N/A 09/08/2019   Procedure: IR Sclerotherapy Treatment;  Surgeon: Radiologist, Medication, MD;  Location: [facility] OR;  Service: Radiology;  Laterality: N/A;   TONSILLECTOMY     Patient Active Problem List   Diagnosis Date Noted   Other allergic rhinitis 09/01/2023   Asthma, severe persistent, well-controlled 09/01/2023   Weight gain 11/16/2022   Compulsive skin picking 11/14/2022   Poor balance 05/12/2022  Autism 04/30/2022   Well woman exam 04/30/2022   Moderate persistent asthma, uncomplicated 10/09/2021   Vitamin D deficiency 10/09/2021   Encounter for long-term (current) use of high-risk medication 12/29/2020   Menorrhagia 12/16/2020   UTI symptoms 04/10/2020   Renal cyst 11/29/2019   Intertrigo 11/29/2019   Complex care coordination 05/18/2019   Chronic bladder pain 03/03/2019   Screening for cervical cancer 02/26/2019   Glucosuria 06/01/2018   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   Not well controlled moderate persistent asthma 04/29/2018   Gastroesophageal reflux disease 04/29/2018   Recurrent infections 04/29/2018   Dysuria 02/20/2018   Chronic rhinitis 04/03/2017   Recurrent falls 03/21/2017   Weakness 03/21/2017   Venous  insufficiency 03/21/2017   OSA on CPAP 09/30/2016   Excessive daytime sleepiness 03/05/2016   Insomnia 10/04/2015   Acute recurrent pansinusitis 07/11/2015   Bronchitis 05/05/2015   Abnormality of gait 03/22/2015   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   Cough variant asthma vs UACS/ vcd  03/15/2015   Pneumonia 02/14/2015   Autism spectrum disorder 02/14/2015   Nausea with vomiting 02/14/2015   Obesity, morbid (HCC) 12/13/2014   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 12/13/2014   Insomnia due to mental disorder 12/13/2014   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   Acanthosis nigricans 11/14/2014   Toeing-in 08/29/2014   Generalized convulsive epilepsy (HCC) 09/28/2012   Partial epilepsy with impairment of consciousness (HCC) 09/28/2012   Cognitive developmental delay 09/28/2012   Recurrent urinary tract infection 08/01/2011   COPD with asthma (HCC) 08/01/2011   Chronic constipation 08/01/2011   Contraception 08/01/2011   Encopresis with constipation and overflow incontinence 02/15/2011   Functional urinary incontinence 02/02/2010   Other convulsions 09/05/2009    ONSET DATE: Referral date 07-08-23  REFERRING DIAG:   Diagnosis  R26.9 (ICD-10-CM) - Abnormality of gait  R29.6 (ICD-10-CM) - Recurrent falls  R26.89 (ICD-10-CM) - Poor balance    THERAPY DIAG:  Other abnormalities of gait and mobility  Muscle weakness (generalized)  Unsteadiness on feet  Rationale for Evaluation and Treatment Rehabilitation  SUBJECTIVE:                                                                                                                                                                                              SUBJECTIVE STATEMENT: [name] states pt continues to have intermittent pain in her legs - has not been back to [facility] because mother had surgery and she (mother)  has not been able to return yet; mother reports pt using rollator or wheelchair  for prolonged distances: pt points to each knee/lower leg and reports  discomfort/pain.  Mother reports pt has UTI at this time - mother states UTI was never resolved so is on another antibiotic Pt accompanied by: mother, [name]  PERTINENT HISTORY: Autism spectrum disorder, h/o generalized convulsive epilepsy, steroid induced diabetes, h/o seizures, moderate intellectual disability  PAIN:  Are you having pain? Unknown - pt does not express pain but mother states pt has occasional pain in her legs:  mother states she has swelling in both legs   PRECAUTIONS: None  WEIGHT BEARING RESTRICTIONS No  FALLS: Has patient fallen in last 6 months? No - mother states they have caught her several times and prevented her from falling  LIVING ENVIRONMENT: Lives with: lives with their family Lives in: House/apartment Stairs: No Has following equipment at home: Environmental consultant - 4 wheeled, Wheelchair (manual), and Tour manager  PLOF: Independent with household mobility with device, Needs assistance with ADLs, and Needs assistance with homemaking  Needs assistance with community ambulation (supervision needed due to decreased cognition)  PATIENT GOALS :  increase strength in legs, improve balance and endurance; mother states goal is to return to working out at [employer]  OBJECTIVE:   DIAGNOSTIC FINDINGS: N/A  COGNITION: Overall cognitive status: Impaired - developmental delay   SENSATION: WFL  COORDINATION: WFL's bil. LE's   POSTURE: rounded shoulders and forward head  LOWER EXTREMITY ROM:   WFL's bil. LE's   LOWER EXTREMITY MMT:  accurate assessment is questionable due to decreased cognition and decreased effort exhibited during testing  MMT Right Eval Left Eval  Hip flexion 3+ 3+  Hip extension    Hip abduction    Hip adduction    Hip internal rotation    Hip external rotation    Knee flexion 3+ 3+  Knee extension 4 3+  Ankle dorsiflexion 4 4-  Ankle plantarflexion    Ankle inversion     Ankle eversion    (Blank rows = not tested)  Pt reports pain/discomfort with resistance with MMT  BED MOBILITY:  Independent  TRANSFERS: Assistive device utilized: None  Sit to stand: SBA Stand to sit: SBA   STAIRS:  Level of Assistance: SBA  Stair Negotiation Technique: Step to Pattern Alternating Pattern  with Bilateral Rails  Number of Stairs: 4   Height of Stairs: 6  Comments: pt ascended & descended steps using step over step sequence using rails  GAIT: Gait pattern: step through pattern; LLE internally rotated with increased supination noted in Lt foot in stance Distance walked:  Assistive device utilized: None Level of assistance: SBA Comments: see above Gait velocity:  15.44 secs =  ft/sec without device  FUNCTIONAL TESTs:   5 times sit to stand: 20.03 secs from high/low mat table without UE support Timed up and go (TUG):    23.97 secs without device  Pt unable to perform SLS on either leg due to imbalance/postural instability Pt able to pick up object off floor and return to standing without LOB - gel bottle used  - with CGA for safety     PATIENT EDUCATION: Education details: eval results with POC of 6 weeks Person educated: Parent Education method: Explanation Education comprehension: verbalized understanding   HOME EXERCISE PROGRAM: To be issued    GOALS: Goals reviewed with patient? Yes  SHORT TERM GOALS: Target date:  (3 weeks) 10-31-23  Amb. 350' (3 laps in gym) nonstop without device with SBA to demo increased endurance. Baseline: 115' with SBA Goal status: INITIAL  2.  Improve TUG score to </= 19 secs without  device to decrease fall risk.  Baseline: 23.97 secs  Goal status: INITIAL  3.  Perform HEP for balance and strengthening with mother's assist.  Baseline:  Goal status: INITIAL   LONG TERM GOALS: Target date: 11-21-23  (6 weeks)  Pt will amb. 465' without device on flat, even surface without LOB with SBA. Baseline: 115' on  09-29-23 Goal status: INITIAL  2.  Perform at least 10 on recumbent bike nonstop for improved endurance/activity.  Baseline:  Goal status: INITIAL  3.  Improve TUG score to </= 16 secs without device to decrease fall risk.  Baseline: 23.97 secs without device Goal status: INITIAL  4.  Pt will perform sit to stand 5 times without use of UE support from mat table in </= 17 secs to demo improved LE strength. Baseline:  20.03 secs with UE support intermittently Goal status: INITIAL  5.  Provide updated HEP as appropriate to be performed with mother's assist. Baseline:  Goal status: INITIAL  ASSESSMENT:  CLINICAL IMPRESSION: Patient is a 28 y.o. female  who was seen today for physical therapy evaluation and treatment for gait and balance impairments.  PMH includes autism, developmental delay and h/o seizures/epilepsy.  Pt exhibits abnormal gait pattern with LLE internally rotated in stance, decreased push off of LLE in stance due to weakness in Lt plantarflexors.  Pt also demonstrates decreased balance with inability to perform SLS on either leg and TUG score 23.97 secs, indicative of fall risk.  Pt reporting bil. LE pain intermittently in today's eval session, however, did not consistently ambulate with antalgic gait pattern.   Pt will benefit from PT to address gait & balance deficits and LE weakness.      OBJECTIVE IMPAIRMENTS Abnormal gait, decreased activity tolerance, decreased balance, decreased cognition, decreased endurance, and decreased strength.   ACTIVITY LIMITATIONS carrying, lifting, bending, squatting, stairs, and locomotion level  PARTICIPATION LIMITATIONS: meal prep, cleaning, laundry, and shopping  PERSONAL FACTORS Behavior pattern, Fitness, Past/current experiences, Time since onset of injury/illness/exacerbation, and 1 comorbidity: Behavior patterns/cognition are also affecting patient's functional outcome.   REHAB POTENTIAL: Good for goals established  CLINICAL  DECISION MAKING: Stable/uncomplicated  EVALUATION COMPLEXITY: Low  PLAN: PT FREQUENCY: 1x/week   PT DURATION: 6 weeks + eval   PLANNED INTERVENTIONS: Therapeutic exercises, Therapeutic activity, Neuromuscular re-education, Balance training, Gait training, Patient/Family education, Self Care, and Stair training  PLAN FOR NEXT SESSION: LE strengthening and balance training   Caroljean Monsivais, [place], PT 09/30/2023, 9:04 PM

## 2023-09-30 ENCOUNTER — Encounter: Payer: Self-pay | Admitting: Physical Therapy

## 2023-10-13 ENCOUNTER — Encounter: Payer: Self-pay | Admitting: Physical Therapy

## 2023-10-13 ENCOUNTER — Ambulatory Visit: Admitting: Physical Therapy

## 2023-10-13 DIAGNOSIS — R2681 Unsteadiness on feet: Secondary | ICD-10-CM

## 2023-10-13 DIAGNOSIS — M6281 Muscle weakness (generalized): Secondary | ICD-10-CM

## 2023-10-13 DIAGNOSIS — R269 Unspecified abnormalities of gait and mobility: Secondary | ICD-10-CM | POA: Diagnosis not present

## 2023-10-13 DIAGNOSIS — R2689 Other abnormalities of gait and mobility: Secondary | ICD-10-CM

## 2023-10-13 DIAGNOSIS — R296 Repeated falls: Secondary | ICD-10-CM | POA: Diagnosis not present

## 2023-10-13 NOTE — Therapy (Signed)
 OUTPATIENT PHYSICAL THERAPY NEURO TREATMENT NOTE   Patient Name: Lori Miles MRN: [id number] DOB:01/14/1996, 28 y.o., female Today's Date: 10/13/2023   PCP: Reinaldo Naomie RIGGERS REFERRING PROVIDER: [place], NP    PT End of Session - 10/13/23 2031     Visit Number 2    Number of Visits 7   6 visits + eval   Date for PT Re-Evaluation 11/24/23   pushed out to allow for scheduling delays   Authorization Type [facility] Medicaid DUAL    Authorization Time Period NO auth required    PT Start Time 0756    PT Stop Time 0844    PT Time Calculation (min) 48 min    Equipment Utilized During Treatment Gait belt    Activity Tolerance Patient tolerated treatment well    Behavior During Therapy WFL for tasks assessed/performed              Past Medical History:  Diagnosis Date   Asthma    Autism disorder    Bacteriuria    Chronic constipation    Cognitive developmental delay    Constipation    COVID-19 03/2020   Disruptive behavior disorder    Dyspnea    if she walks much   Family history of adverse reaction to anesthesia     hives-? if anesthesia did take antibiotic   Frequency-urgency syndrome    Gait disorder    ORGANIC PER NERUOLGIST NOTE (DR HICKLING)   Generalized convulsive epilepsy (HCC) NEUROLOGIST-  DR HICKLING   GERD (gastroesophageal reflux disease)    Gout    H/O sinus tachycardia    History of acute respiratory failure 02/14/2015   SECONDARY TO CAP   History of recurrent UTIs    Incontinent of feces    Interstitial cystitis    Intertrigo 11/29/2019   Language development disorder    answers yes and no only   Moderate intellectual disability    OSA (obstructive sleep apnea)    no machine yet    Partial epilepsy with impairment of consciousness (HCC)    FOLLOWED BY DR HICKLING   Pneumonia 2    4 times this year ( 2020)   PONV (postoperative nausea and vomiting)    Seizure (HCC)    04/26/22 over 20 years   Urinary incontinence    Past  Surgical History:  Procedure Laterality Date   CYSTOSCOPY N/A 06/03/2017   Procedure: CYSTOSCOPY WITH EXAM UNDER ANESTHESIA;  Surgeon: Nieves Cough, MD;  Location: [facility];  Service: Urology;  Laterality: N/A;   CYSTOSCOPY  2020   CYSTOSCOPY WITH HYDRODISTENSION AND BIOPSY  04/14/2012   Procedure: CYSTOSCOPY/BIOPSY/HYDRODISTENSION;  Surgeon: Thomasine Oiler, MD;  Location: [facility];  Service: Urology;;  instillation of marcaine  and pyridium    EUA/  PAP SMEAR/  BREAST EXAM  03-12-2016   dr corene [facility]   EUA/ VAGINOSCOPY/ COLPOSCOPY FO THE HYMENAL RING  10-04-2009    dr rogelio  [facility]   IR RADIOLOGIST EVAL & MGMT  07/29/2019   IR RADIOLOGIST EVAL & MGMT  09/30/2019   IR SCLEROTHERAPY OF A FLUID COLLECTION  09/08/2019   RADIOLOGY WITH ANESTHESIA N/A 09/08/2019   Procedure: IR Sclerotherapy Treatment;  Surgeon: Radiologist, Medication, MD;  Location: [facility];  Service: Radiology;  Laterality: N/A;   TONSILLECTOMY     Patient Active Problem List   Diagnosis Date Noted   Other allergic rhinitis 09/01/2023   Asthma, severe persistent, well-controlled 09/01/2023   Weight gain 11/16/2022  Compulsive skin picking 11/14/2022   Poor balance 05/12/2022   Autism 04/30/2022   Well woman exam 04/30/2022   Moderate persistent asthma, uncomplicated 10/09/2021   Vitamin D deficiency 10/09/2021   Encounter for long-term (current) use of high-risk medication 12/29/2020   Menorrhagia 12/16/2020   UTI symptoms 04/10/2020   Renal cyst 11/29/2019   Intertrigo 11/29/2019   Complex care coordination 05/18/2019   Chronic bladder pain 03/03/2019   Screening for cervical cancer 02/26/2019   Glucosuria 06/01/2018   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   Not well controlled moderate persistent asthma 04/29/2018   Gastroesophageal reflux disease 04/29/2018   Recurrent infections 04/29/2018   Dysuria 02/20/2018   Chronic rhinitis 04/03/2017   Recurrent falls  03/21/2017   Weakness 03/21/2017   Venous insufficiency 03/21/2017   OSA on CPAP 09/30/2016   Excessive daytime sleepiness 03/05/2016   Insomnia 10/04/2015   Acute recurrent pansinusitis 07/11/2015   Bronchitis 05/05/2015   Abnormality of gait 03/22/2015   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   Cough variant asthma vs UACS/ vcd  03/15/2015   Pneumonia 02/14/2015   Autism spectrum disorder 02/14/2015   Nausea with vomiting 02/14/2015   Obesity, morbid (HCC) 12/13/2014   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 12/13/2014   Insomnia due to mental disorder 12/13/2014   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   Acanthosis nigricans 11/14/2014   Toeing-in 08/29/2014   Generalized convulsive epilepsy (HCC) 09/28/2012   Partial epilepsy with impairment of consciousness (HCC) 09/28/2012   Cognitive developmental delay 09/28/2012   Recurrent urinary tract infection 08/01/2011   COPD with asthma (HCC) 08/01/2011   Chronic constipation 08/01/2011   Contraception 08/01/2011   Encopresis with constipation and overflow incontinence 02/15/2011   Functional urinary incontinence 02/02/2010   Other convulsions 09/05/2009    ONSET DATE: Referral date 07-08-23  REFERRING DIAG:   Diagnosis  R26.9 (ICD-10-CM) - Abnormality of gait  R29.6 (ICD-10-CM) - Recurrent falls  R26.89 (ICD-10-CM) - Poor balance    THERAPY DIAG:  Other abnormalities of gait and mobility  Muscle weakness (generalized)  Unsteadiness on feet  Rationale for Evaluation and Treatment Rehabilitation  SUBJECTIVE:                                                                                                                                                                                              SUBJECTIVE STATEMENT: [name] states she thinks pt may have another UTI - states they noticed her Rt leg tremored at times over the weekend Pt accompanied by: mother, [name]  PERTINENT HISTORY:  Autism spectrum disorder, h/o generalized convulsive epilepsy, steroid induced diabetes,  h/o seizures, moderate intellectual disability  PAIN:  Are you having pain? pt does not express pain but mother states pt has occasional pain in her legs:  mother states she has swelling in both legs   PRECAUTIONS: None  WEIGHT BEARING RESTRICTIONS No  FALLS: Has patient fallen in last 6 months? No - mother states they have caught her several times and prevented her from falling  LIVING ENVIRONMENT: Lives with: lives with their family Lives in: House/apartment Stairs: No Has following equipment at home: Environmental consultant - 4 wheeled, Wheelchair (manual), and Tour manager  PLOF: Independent with household mobility with device, Needs assistance with ADLs, and Needs assistance with homemaking  Needs assistance with community ambulation (supervision needed due to decreased cognition)  PATIENT GOALS :  increase strength in legs, improve balance and endurance; mother states goal is to return to working out at [employer]  OBJECTIVE:   DIAGNOSTIC FINDINGS: N/A  COGNITION: Overall cognitive status: Impaired - developmental delay   SENSATION: WFL  COORDINATION: WFL's bil. LE's   POSTURE: rounded shoulders and forward head  LOWER EXTREMITY ROM:   WFL's bil. LE's   LOWER EXTREMITY MMT:  accurate assessment is questionable due to decreased cognition and decreased effort exhibited during testing  MMT Right Eval Left Eval  Hip flexion 3+ 3+  Hip extension    Hip abduction    Hip adduction    Hip internal rotation    Hip external rotation    Knee flexion 3+ 3+  Knee extension 4 3+  Ankle dorsiflexion 4 4-  Ankle plantarflexion    Ankle inversion    Ankle eversion    (Blank rows = not tested)  Pt reports pain/discomfort with resistance with MMT  BED MOBILITY:  Independent  TRANSFERS: Assistive device utilized: None  Sit to stand: SBA Stand to sit: SBA   STAIRS:  Level of Assistance:  SBA  Stair Negotiation Technique: Step to Pattern Alternating Pattern  with Bilateral Rails  Number of Stairs: 4   Height of Stairs: 6  Comments: pt ascended & descended steps using step over step sequence using rails  GAIT: Gait pattern: step through pattern; LLE internally rotated with increased supination noted in Lt foot in stance Distance walked:  Assistive device utilized: None Level of assistance: SBA Comments: see above Gait velocity:  15.44 secs =  ft/sec without device  FUNCTIONAL TESTs:   5 times sit to stand: 20.03 secs from high/low mat table without UE support Timed up and go (TUG):    23.97 secs without device  Pt unable to perform SLS on either leg due to imbalance/postural instability Pt able to pick up object off floor and return to standing without LOB - gel bottle used  - with CGA for safety     PATIENT EDUCATION: Education details: eval results with POC of 6 weeks Person educated: Parent Education method: Explanation Education comprehension: verbalized understanding   HOME EXERCISE PROGRAM: To be issued  Today's Treatment:  10-13-23  Gait:Gait pattern: step through pattern; LLE internally rotated with increased supination noted in Lt foot in stance Distance walked: 180' x 1 rep; 100' x 1 rep - HHA due to pt hesitancy to amb. independently Assistive device utilized: No Level of assistance: HHA from PT  TherEx: SciFit Level 3 x 10 with bil. Ue's and LE's for strengthening & cardiovascular conditioning  Leg press bil. LE's 50# 10 reps x 3 sets  Sit to stand from mat 5 reps - feet on Airex - no UE  support     NeuroRe-ed:  Rockerboard 10 reps x 2 sets inside // bars with UE support  Marching on Airex 10 reps with UE support on // bars Sit to stand from mat with feet on Airex - 5 reps - cues to perform without UE support but pt used hands intermittently Attempted tall kneeling position on mat on floor but pt reported knee pain in this position  so transferred to standing  Pt performed tandem walking on blue foam balance beam 2 reps with use of bar at mirror for assist with balance  TherAct:   In standing position by side of mat, pt performed Zoom ball - 10 reps x 2 sets for improved trunk control, balance and UE strength and coordination   Pt performed sidestepping on blue foam balance beam - reaching for squizzies placed in various locations on mirror; used bar for UE support for assist with balance recovery           Pt performed kicking medium sized purple ball - PT rolled ball slowly to patient - pt kicked ball back to PT, intermittently alternating feet ; approx. 5 reps with each LE - no UE support             used                 GOALS: Goals reviewed with patient? Yes  SHORT TERM GOALS: Target date:  (3 weeks) 10-31-23  Amb. 350' (3 laps in gym) nonstop without device with SBA to demo increased endurance. Baseline: 115' with SBA Goal status: INITIAL  2.  Improve TUG score to </= 19 secs without device to decrease fall risk.  Baseline: 23.97 secs  Goal status: INITIAL  3.  Perform HEP for balance and strengthening with mother's assist.  Baseline:  Goal status: INITIAL   LONG TERM GOALS: Target date: 11-21-23  (6 weeks)  Pt will amb. 465' without device on flat, even surface without LOB with SBA. Baseline: 115' on 09-29-23 Goal status: INITIAL  2.  Perform at least 10 on recumbent bike nonstop for improved endurance/activity.  Baseline:  Goal status: INITIAL  3.  Improve TUG score to </= 16 secs without device to decrease fall risk.  Baseline: 23.97 secs without device Goal status: INITIAL  4.  Pt will perform sit to stand 5 times without use of UE support from mat table in </= 17 secs to demo improved LE strength. Baseline:  20.03 secs with UE support intermittently Goal status: INITIAL  5.  Provide updated HEP as appropriate to be performed with mother's assist. Baseline:  Goal status:  INITIAL  ASSESSMENT:  CLINICAL IMPRESSION: PT session focused on activities to improve standing balance, coordination, and LE strengthening.  Pt expressed RLE discomfort during rockerboard activity, but pain quickly appeared to subside with seated rest period.  Pt needed UE support with sidestepping and tandem walking on blue foam balance beam.  Pt's LLE continues to internally rotate during gait but pt able to externally rotate  Lt leg with verbal cues.  Cont with POC.    OBJECTIVE IMPAIRMENTS Abnormal gait, decreased activity tolerance, decreased balance, decreased cognition, decreased endurance, and decreased strength.   ACTIVITY LIMITATIONS carrying, lifting, bending, squatting, stairs, and locomotion level  PARTICIPATION LIMITATIONS: meal prep, cleaning, laundry, and shopping  PERSONAL FACTORS Behavior pattern, Fitness, Past/current experiences, Time since onset of injury/illness/exacerbation, and 1 comorbidity: Behavior patterns/cognition are also affecting patient's functional outcome.   REHAB POTENTIAL: Good for goals established  CLINICAL DECISION MAKING: Stable/uncomplicated  EVALUATION COMPLEXITY: Low  PLAN: PT FREQUENCY: 1x/week   PT DURATION: 6 weeks + eval   PLANNED INTERVENTIONS: Therapeutic exercises, Therapeutic activity, Neuromuscular re-education, Balance training, Gait training, Patient/Family education, Self Care, and Stair training  PLAN FOR NEXT SESSION: LE strengthening and balance training   Daneka Lantigua, [place], PT 10/13/2023, 8:33 PM

## 2023-10-19 ENCOUNTER — Other Ambulatory Visit: Payer: Self-pay | Admitting: Internal Medicine

## 2023-10-20 ENCOUNTER — Ambulatory Visit: Admitting: Physical Therapy

## 2023-10-20 DIAGNOSIS — J4520 Mild intermittent asthma, uncomplicated: Secondary | ICD-10-CM | POA: Diagnosis not present

## 2023-10-21 ENCOUNTER — Ambulatory Visit: Attending: Family | Admitting: Physical Therapy

## 2023-10-21 DIAGNOSIS — R2689 Other abnormalities of gait and mobility: Secondary | ICD-10-CM | POA: Diagnosis not present

## 2023-10-21 DIAGNOSIS — M6281 Muscle weakness (generalized): Secondary | ICD-10-CM | POA: Diagnosis not present

## 2023-10-21 DIAGNOSIS — R2681 Unsteadiness on feet: Secondary | ICD-10-CM | POA: Insufficient documentation

## 2023-10-22 ENCOUNTER — Encounter: Payer: Self-pay | Admitting: Physical Therapy

## 2023-10-22 NOTE — Therapy (Addendum)
 OUTPATIENT PHYSICAL THERAPY NEURO TREATMENT NOTE   Patient Name: Lori Miles MRN: [id number] DOB:Jul 22, 1995, 28 y.o., female Today's Date: 10/22/2023   PCP: Reinaldo Naomie RIGGERS REFERRING PROVIDER: [place], NP    PT End of Session - 10/22/23 1036     Visit Number 3    Number of Visits 7   6 visits + eval   Date for PT Re-Evaluation 11/24/23   pushed out to allow for scheduling delays   Authorization Type [facility] Medicaid DUAL    Authorization Time Period NO auth required    PT Start Time 0758    PT Stop Time 0845    PT Time Calculation (min) 47 min    Equipment Utilized During Treatment Gait belt    Activity Tolerance Patient tolerated treatment well    Behavior During Therapy WFL for tasks assessed/performed              Past Medical History:  Diagnosis Date   Asthma    Autism disorder    Bacteriuria    Chronic constipation    Cognitive developmental delay    Constipation    COVID-19 03/2020   Disruptive behavior disorder    Dyspnea    if she walks much   Family history of adverse reaction to anesthesia     hives-? if anesthesia did take antibiotic   Frequency-urgency syndrome    Gait disorder    ORGANIC PER NERUOLGIST NOTE (DR HICKLING)   Generalized convulsive epilepsy (HCC) NEUROLOGIST-  DR HICKLING   GERD (gastroesophageal reflux disease)    Gout    H/O sinus tachycardia    History of acute respiratory failure 02/14/2015   SECONDARY TO CAP   History of recurrent UTIs    Incontinent of feces    Interstitial cystitis    Intertrigo 11/29/2019   Language development disorder    answers yes and no only   Moderate intellectual disability    OSA (obstructive sleep apnea)    no machine yet    Partial epilepsy with impairment of consciousness (HCC)    FOLLOWED BY DR HICKLING   Pneumonia 2    4 times this year ( 2020)   PONV (postoperative nausea and vomiting)    Seizure (HCC)    04/26/22 over 20 years   Urinary incontinence    Past  Surgical History:  Procedure Laterality Date   CYSTOSCOPY N/A 06/03/2017   Procedure: CYSTOSCOPY WITH EXAM UNDER ANESTHESIA;  Surgeon: Nieves Cough, MD;  Location: [facility];  Service: Urology;  Laterality: N/A;   CYSTOSCOPY  2020   CYSTOSCOPY WITH HYDRODISTENSION AND BIOPSY  04/14/2012   Procedure: CYSTOSCOPY/BIOPSY/HYDRODISTENSION;  Surgeon: Thomasine Oiler, MD;  Location: [facility];  Service: Urology;;  instillation of marcaine  and pyridium    EUA/  PAP SMEAR/  BREAST EXAM  03-12-2016   dr corene [facility]   EUA/ VAGINOSCOPY/ COLPOSCOPY FO THE HYMENAL RING  10-04-2009    dr rogelio  [facility]   IR RADIOLOGIST EVAL & MGMT  07/29/2019   IR RADIOLOGIST EVAL & MGMT  09/30/2019   IR SCLEROTHERAPY OF A FLUID COLLECTION  09/08/2019   RADIOLOGY WITH ANESTHESIA N/A 09/08/2019   Procedure: IR Sclerotherapy Treatment;  Surgeon: Radiologist, Medication, MD;  Location: [facility] OR;  Service: Radiology;  Laterality: N/A;   TONSILLECTOMY     Patient Active Problem List   Diagnosis Date Noted   Other allergic rhinitis 09/01/2023   Asthma, severe persistent, well-controlled 09/01/2023   Weight gain 11/16/2022  Compulsive skin picking 11/14/2022   Poor balance 05/12/2022   Autism 04/30/2022   Well woman exam 04/30/2022   Moderate persistent asthma, uncomplicated 10/09/2021   Vitamin D deficiency 10/09/2021   Encounter for long-term (current) use of high-risk medication 12/29/2020   Menorrhagia 12/16/2020   UTI symptoms 04/10/2020   Renal cyst 11/29/2019   Intertrigo 11/29/2019   Complex care coordination 05/18/2019   Chronic bladder pain 03/03/2019   Screening for cervical cancer 02/26/2019   Glucosuria 06/01/2018   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   Not well controlled moderate persistent asthma 04/29/2018   Gastroesophageal reflux disease 04/29/2018   Recurrent infections 04/29/2018   Dysuria 02/20/2018   Chronic rhinitis 04/03/2017   Recurrent falls  03/21/2017   Weakness 03/21/2017   Venous insufficiency 03/21/2017   OSA on CPAP 09/30/2016   Excessive daytime sleepiness 03/05/2016   Insomnia 10/04/2015   Acute recurrent pansinusitis 07/11/2015   Bronchitis 05/05/2015   Abnormality of gait 03/22/2015   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   Cough variant asthma vs UACS/ vcd  03/15/2015   Pneumonia 02/14/2015   Autism spectrum disorder 02/14/2015   Nausea with vomiting 02/14/2015   Obesity, morbid (HCC) 12/13/2014   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 12/13/2014   Insomnia due to mental disorder 12/13/2014   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   Acanthosis nigricans 11/14/2014   Toeing-in 08/29/2014   Generalized convulsive epilepsy (HCC) 09/28/2012   Partial epilepsy with impairment of consciousness (HCC) 09/28/2012   Cognitive developmental delay 09/28/2012   Recurrent urinary tract infection 08/01/2011   COPD with asthma (HCC) 08/01/2011   Chronic constipation 08/01/2011   Contraception 08/01/2011   Encopresis with constipation and overflow incontinence 02/15/2011   Functional urinary incontinence 02/02/2010   Other convulsions 09/05/2009    ONSET DATE: Referral date 07-08-23  REFERRING DIAG:   Diagnosis  R26.9 (ICD-10-CM) - Abnormality of gait  R29.6 (ICD-10-CM) - Recurrent falls  R26.89 (ICD-10-CM) - Poor balance    THERAPY DIAG:  Other abnormalities of gait and mobility  Muscle weakness (generalized)  Unsteadiness on feet  Rationale for Evaluation and Treatment Rehabilitation  SUBJECTIVE:                                                                                                                                                                                              SUBJECTIVE STATEMENT: [name] reports pt is doing better today than she was at time of previous PT session last Monday - says she was given a new medication for her UTI. Working on trying to get  brakes on rollator fixed but has  not received call back from [facility]  Pt accompanied by: mother, [name]  PERTINENT HISTORY: Autism spectrum disorder, h/o generalized convulsive epilepsy, steroid induced diabetes, h/o seizures, moderate intellectual disability  PAIN:  Are you having pain? pt does not express pain but mother states pt has occasional pain in her legs:  mother states she has swelling in both legs   PRECAUTIONS: None  WEIGHT BEARING RESTRICTIONS No  FALLS: Has patient fallen in last 6 months? No - mother states they have caught her several times and prevented her from falling  LIVING ENVIRONMENT: Lives with: lives with their family Lives in: House/apartment Stairs: No Has following equipment at home: Environmental consultant - 4 wheeled, Wheelchair (manual), and Tour manager  PLOF: Independent with household mobility with device, Needs assistance with ADLs, and Needs assistance with homemaking  Needs assistance with community ambulation (supervision needed due to decreased cognition)  PATIENT GOALS :  increase strength in legs, improve balance and endurance; mother states goal is to return to working out at [employer]  OBJECTIVE:   DIAGNOSTIC FINDINGS: N/A  COGNITION: Overall cognitive status: Impaired - developmental delay   SENSATION: WFL  COORDINATION: WFL's bil. LE's   POSTURE: rounded shoulders and forward head  LOWER EXTREMITY ROM:   WFL's bil. LE's   LOWER EXTREMITY MMT:  accurate assessment is questionable due to decreased cognition and decreased effort exhibited during testing  MMT Right Eval Left Eval  Hip flexion 3+ 3+  Hip extension    Hip abduction    Hip adduction    Hip internal rotation    Hip external rotation    Knee flexion 3+ 3+  Knee extension 4 3+  Ankle dorsiflexion 4 4-  Ankle plantarflexion    Ankle inversion    Ankle eversion    (Blank rows = not tested)  Pt reports pain/discomfort with resistance with MMT  BED MOBILITY:   Independent  TRANSFERS: Assistive device utilized: None  Sit to stand: SBA Stand to sit: SBA   STAIRS:  Level of Assistance: SBA  Stair Negotiation Technique: Step to Pattern Alternating Pattern  with Bilateral Rails  Number of Stairs: 4   Height of Stairs: 6  Comments: pt ascended & descended steps using step over step sequence using rails  GAIT: Gait pattern: step through pattern; LLE internally rotated with increased supination noted in Lt foot in stance Distance walked:  Assistive device utilized: None Level of assistance: SBA Comments: see above Gait velocity:  15.44 secs =  ft/sec without device  FUNCTIONAL TESTs:   5 times sit to stand: 20.03 secs from high/low mat table without UE support Timed up and go (TUG):    23.97 secs without device  Pt unable to perform SLS on either leg due to imbalance/postural instability Pt able to pick up object off floor and return to standing without LOB - gel bottle used  - with CGA for safety     PATIENT EDUCATION: Education details: eval results with POC of 6 weeks Person educated: Parent Education method: Explanation Education comprehension: verbalized understanding   HOME EXERCISE PROGRAM: To be issued  Today's Treatment:  10-21-23  Gait:  Gait pattern: step through pattern; LLE internally rotated with increased supination noted in Lt foot in stance Distance walked: 350' x 1 rep at start of session:  230' x 1 rep at end of session with min HHA per pt's request and to encourage participation Assistive device utilized: No Level of assistance: HHA from PT  Step training - 4  steps x 2 reps with use of bil. Handrails for support & assist with balance; step over step with ascension and step by step sequence with descension   TherEx: SciFit Level 3 x 5 with bil. Ue's and LE's for strengthening & cardiovascular conditioning  Leg press bil. LE's 50# 10 reps x 3 sets  Sit to stand from mat 5 reps - feet on Airex - no UE  support           Pt performed step up onto 6 step 10 reps each leg for quad strengthening - with CGA to min assist           NeuroRe-ed:  Rockerboard 10 reps x 2 sets inside // bars with UE support  Blue foam balance beam placed inside // bars - pt performed 2 laps of sidestepping with UE support on // bars  Pt performed tandem walking on blue foam balance beam 2 reps with use of // bars for UE support progressed to tipping cone over and standing upright for improved SLS on each leg and for improved coordination  Pt stood on blue mat on floor - performed marching 10 reps each leg with CGA;  Pt performed cone taps to 4 cones with min HHA - standing on mat with cones on floor  Pt stood on floor - performed tipping cone over  (4 used) and standing cone upright for improved SLS on each leg and for improved coordination                GOALS: Goals reviewed with patient? Yes  SHORT TERM GOALS: Target date:  (3 weeks) 10-31-23  Amb. 350' (3 laps in gym) nonstop without device with SBA to demo increased endurance. Baseline: 115' with SBA Goal status: INITIAL  2.  Improve TUG score to </= 19 secs without device to decrease fall risk.  Baseline: 23.97 secs  Goal status: INITIAL  3.  Perform HEP for balance and strengthening with mother's assist.  Baseline:  Goal status: INITIAL   LONG TERM GOALS: Target date: 11-21-23  (6 weeks)  Pt will amb. 465' without device on flat, even surface without LOB with SBA. Baseline: 115' on 09-29-23 Goal status: INITIAL  2.  Perform at least 10 on recumbent bike nonstop for improved endurance/activity.  Baseline:  Goal status: INITIAL  3.  Improve TUG score to </= 16 secs without device to decrease fall risk.  Baseline: 23.97 secs without device Goal status: INITIAL  4.  Pt will perform sit to stand 5 times without use of UE support from mat table in </= 17 secs to demo improved LE strength. Baseline:  20.03 secs with UE support  intermittently Goal status: INITIAL  5.  Provide updated HEP as appropriate to be performed with mother's assist. Baseline:  Goal status: INITIAL  ASSESSMENT:  CLINICAL IMPRESSION: PT session focused on gait training without device, balance training to improve SLS on each leg and strengthening exercises.  Pt continues to need encouragement for participation in some activities but improved participation in today's session compared to that in previous session last week.  Pt tolerated exercises well with no c/o pain in either leg.  Cont with POC.    OBJECTIVE IMPAIRMENTS Abnormal gait, decreased activity tolerance, decreased balance, decreased cognition, decreased endurance, and decreased strength.   ACTIVITY LIMITATIONS carrying, lifting, bending, squatting, stairs, and locomotion level  PARTICIPATION LIMITATIONS: meal prep, cleaning, laundry, and shopping  PERSONAL FACTORS Behavior pattern, Fitness, Past/current experiences, Time since onset of  injury/illness/exacerbation, and 1 comorbidity: Behavior patterns/cognition are also affecting patient's functional outcome.   REHAB POTENTIAL: Good for goals established  CLINICAL DECISION MAKING: Stable/uncomplicated  EVALUATION COMPLEXITY: Low  PLAN: PT FREQUENCY: 1x/week   PT DURATION: 6 weeks + eval   PLANNED INTERVENTIONS: Therapeutic exercises, Therapeutic activity, Neuromuscular re-education, Balance training, Gait training, Patient/Family education, Self Care, and Stair training  PLAN FOR NEXT SESSION: LE strengthening and balance training   Bronislaw Switzer, [place], PT 10/22/2023, 10:39 AM

## 2023-10-27 ENCOUNTER — Ambulatory Visit: Admitting: Physical Therapy

## 2023-10-27 NOTE — Progress Notes (Signed)
 Lori Miles   MRN:  [id number]  03/16/1996   Provider: [name] Bollman NP-C Location of Care: [facility]  Visit type: Return visit  Last visit: 05/01/2023  Referral source: Scifres, [name], PA-C (Inactive) History from: Epic chart and patient's mother  Brief history:  Copied from previous record: Generalized and convulsive epilepsy, intellectual disability, autism spectrum disorder, disordered sleep and gait disorder. She also has asthma and history of frequent urinary tract infections. She is taking and tolerating Depakote  for seizures and Trazodone  for insomnia   Today's concerns: Mom reports that Lori Miles had a brief partial seizure when she was sick with UTI but has otherwise remained seizure free.  Mom reports that she has had fewer asthma flairs over the summer.  She is receiving Physical Therapy for ongoing problems with gait. Mom reports that Lori Miles falls easily if not supported when walking on uneven surfaces No new equipment needs today Mom reports that behavior is generally good but that Lori Miles can be restless at times.  [name] has been otherwise generally healthy since she was last seen. No health concerns today other than previously mentioned.  Review of systems: Please see HPI for neurologic and other pertinent review of systems. Otherwise all other systems were reviewed and were negative.  Problem List: Patient Active Problem List   Diagnosis Date Noted   Other allergic rhinitis 09/01/2023   Asthma, severe persistent, well-controlled 09/01/2023   Weight gain 11/16/2022   Compulsive skin picking 11/14/2022   Poor balance 05/12/2022   Autism 04/30/2022   Well woman exam 04/30/2022   Moderate persistent asthma, uncomplicated 10/09/2021   Vitamin D deficiency 10/09/2021   Encounter for long-term (current) use of high-risk medication 12/29/2020   Menorrhagia 12/16/2020   UTI symptoms 04/10/2020   Renal cyst  11/29/2019   Intertrigo 11/29/2019   Complex care coordination 05/18/2019   Chronic bladder pain 03/03/2019   Screening for cervical cancer 02/26/2019   Glucosuria 06/01/2018   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   Not well controlled moderate persistent asthma 04/29/2018   Gastroesophageal reflux disease 04/29/2018   Recurrent infections 04/29/2018   Dysuria 02/20/2018   Chronic rhinitis 04/03/2017   Recurrent falls 03/21/2017   Weakness 03/21/2017   Venous insufficiency 03/21/2017   OSA on CPAP 09/30/2016   Excessive daytime sleepiness 03/05/2016   Insomnia 10/04/2015   Acute recurrent pansinusitis 07/11/2015   Bronchitis 05/05/2015   Abnormality of gait 03/22/2015   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   Cough variant asthma vs UACS/ vcd  03/15/2015   Pneumonia 02/14/2015   Autism spectrum disorder 02/14/2015   Nausea with vomiting 02/14/2015   Obesity, morbid (HCC) 12/13/2014   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 12/13/2014   Insomnia due to mental disorder 12/13/2014   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   Acanthosis nigricans 11/14/2014   Toeing-in 08/29/2014   Generalized convulsive epilepsy (HCC) 09/28/2012   Partial epilepsy with impairment of consciousness (HCC) 09/28/2012   Cognitive developmental delay 09/28/2012   Recurrent urinary tract infection 08/01/2011   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 if anesthesia  did take antibiotic   Frequency-urgency syndrome    Gait disorder    ORGANIC PER NERUOLGIST NOTE (DR HICKLING)   Generalized convulsive epilepsy (HCC) NEUROLOGIST-  DR HICKLING   GERD (gastroesophageal reflux disease)    Gout    H/O sinus tachycardia    History of acute respiratory failure 02/14/2015   SECONDARY TO CAP   History of recurrent UTIs    Incontinent of feces    Interstitial cystitis    Intertrigo 11/29/2019   Language development disorder    answers yes and no only   Moderate intellectual disability    OSA (obstructive sleep apnea)    no machine yet    Partial epilepsy with impairment of consciousness (HCC)    FOLLOWED BY DR HICKLING   Pneumonia 2    4 times this year ( 2020)   PONV (postoperative nausea and vomiting)    Seizure (HCC)    04/26/22 over 20 years   Urinary incontinence     Past medical history comments: See HPI Copied from previous record: EEG on September 14, 2011, was normal. She was able to be successfully weaned off Keppra. The patient has significant issues with aggression, which was thought to be related to Keppra, but has persisted once it was discontinued. The patient was treated with Depakote  both for behavior and possible seizures. She had palpitations and severe constipation on Seroquel. She had marked weight gain on Abilify  and Depakote . When generic Divalproex  was tried she had breakthrough seizures.      Nocturnal polysomnogram on October 27, 2011, failed to show significant periodic limb movements, cyanosis, sleep apnea, or cardiac arrhythmia.  EEG on February 20, 2012, was a normal study with the patient awake.    EEG performed March 25, 2017 was normal   EEG performed May 21, 2018 was normal  Surgical history: Past Surgical History:  Procedure Laterality Date   CYSTOSCOPY N/A 06/03/2017   Procedure: CYSTOSCOPY WITH EXAM UNDER ANESTHESIA;  Surgeon: Nieves Cough, MD;  Location: [facility];  Service: Urology;  Laterality:  N/A;   CYSTOSCOPY  2020   CYSTOSCOPY WITH HYDRODISTENSION AND BIOPSY  04/14/2012   Procedure: CYSTOSCOPY/BIOPSY/HYDRODISTENSION;  Surgeon: Thomasine Oiler, MD;  Location: [facility];  Service: Urology;;  instillation of marcaine  and pyridium    EUA/  PAP SMEAR/  BREAST EXAM  03-12-2016   dr corene [facility]   EUA/ VAGINOSCOPY/ COLPOSCOPY FO THE HYMENAL RING  10-04-2009    dr rogelio  [facility]   IR RADIOLOGIST EVAL & MGMT  07/29/2019   IR RADIOLOGIST EVAL & MGMT  09/30/2019   IR SCLEROTHERAPY OF A FLUID COLLECTION  09/08/2019   RADIOLOGY WITH ANESTHESIA N/A 09/08/2019   Procedure: IR Sclerotherapy Treatment;  Surgeon: Radiologist, Medication, MD;  Location: [facility] OR;  Service: Radiology;  Laterality: N/A;   TONSILLECTOMY      Family history: family history includes Pancreatic cancer in her sister; Seizures in her mother and sister.   Social history: Social History   Socioeconomic History   Marital status: Single    Spouse name: Not on file   Number of children: Not on file   Years of education: Not on file   Highest education level: Not on file  Occupational History   Not on file  Tobacco Use  Smoking status: Never   Smokeless tobacco: Never  Vaping Use   Vaping status: Never Used  Substance and Sexual Activity   Alcohol  use: No    Alcohol /week: 0.0 standard drinks of alcohol    Drug use: No   Sexual activity: Never    Birth control/protection: Pill  Other Topics Concern   Not on file  Social History Narrative   Lives with mom ([name] Krah) and half-sister Oretha Weismann) who is also mentally impaired.  Has CAP assistance, urinary incontinence, needs help with feeding,dressing, toileting   Graduated from [employer].  She enjoys playing with cars, dancing, and dogs.   Social Drivers of Corporate investment banker Strain: Not on file  Food Insecurity: Low Risk  (04/28/2023)   Received from [facility]   Hunger Vital Sign    Within the past 12  months, you worried that your food would run out before you got money to buy more: Never true    Within the past 12 months, the food you bought just didn't last and you didn't have money to get more. : Never true  Transportation Needs: No Transportation Needs (04/28/2023)   Received from [facility]    In the past 12 months, has lack of reliable transportation kept you from medical appointments, meetings, work or from getting things needed for daily living? : No  Physical Activity: Not on file  Stress: Not on file  Social Connections: Not on file  Intimate Partner Violence: Not on file    Past/failed meds: Copied from previous record: Levetiracetam - aggressive behavior Divalproex  - breakthrough seizures  Allergies: Allergies  Allergen Reactions   Abilify  [Aripiprazole ] Swelling and Palpitations   Seroquel [Quetiapine Fumarate] Palpitations   Amoxicillin -Pot Clavulanate Hives and Other (See Comments)    Has patient had a PCN reaction causing immediate rash, facial/tongue/throat swelling, SOB or lightheadedness with hypotension: No Has patient had a PCN reaction causing severe rash involving mucus membranes or skin necrosis:    #  #  YES  #  #  Has patient had a PCN reaction that required hospitalization: No Has patient had a PCN reaction occurring within the last 10 years: No    Bactrim  [Sulfamethoxazole -Trimethoprim ] Other (See Comments)    Abdominal pain   Doxycycline  Other (See Comments)    Stomach cramps   Macrobid  [Nitrofurantoin  Macrocrystal] Other (See Comments)    Sharp pain/ given with Tylenol  325 mg   Keppra [Levetiracetam] Other (See Comments)    Aggressive behavior     Immunizations: Immunization History  Administered Date(s) Administered   DTaP 02/06/1995, 04/30/1995, 07/02/1995, 01/14/1996, 10/10/1998   HIB (PRP-T) 02/06/1995, 04/30/1995, 07/02/1995, 01/14/1996   Hepatitis B, PED/ADOLESCENT 03/01/95, 02/06/1995, 01/14/1996   IPV 02/06/1995,  04/30/1995, 07/02/1995, 01/14/1996, 10/10/1998   MMR 01/14/1996, 10/10/1998   Pneumococcal Polysaccharide-23 08/06/2016, 03/07/2020   Tdap 03/03/2017   Varicella 01/14/1996    Diagnostics/Screenings: Copied from previous record: 12/15/19 rEEG - This EEG is normal during awake and asleep states. Please note that normal EEG does not exclude epilepsy, clinical correlation is indicated.  Norwood Abu, MD   03/27/17 - CT head - normal  Physical Exam: BP 120/78   Pulse (!) 112   Ht 4' 9.5 (1.461 m)   Wt 176 lb (79.8 kg)   BMI 37.43 kg/m   General: well developed, well nourished obese woman, seated on exam table, in no evident distress Head: normocephalic and atraumatic. Oropharynx difficult to examine but appears benign. No dysmorphic features.  Neck: supple Cardiovascular: regular rate and rhythm, no murmurs. Respiratory: clear to auscultation bilaterally Abdomen: bowel sounds present all four quadrants, abdomen soft, non-tender, non-distended.  Musculoskeletal: no skeletal deformities or obvious scoliosis.  Skin: no rashes or neurocutaneous lesions  Neurologic Exam Mental Status: awake and fully alert. Has limited language.  Variable eye contact. Immature behavior. Needs frequent redirection.  Cranial Nerves: fundoscopic exam - red reflex present.  Unable to fully visualize fundus.  Pupils equal briskly reactive to light.  Turns to localize faces and objects in the periphery. Turns to localize sounds in the periphery. Facial movements are symmetric Motor: fairly normal functional bulk, tone and strength Sensory: withdrawal x 4 Coordination: unable to adequately assess due to patient's inability to participate in examination. No dysmetria with reach for objects. Gait and Station: able to stand and walk but has poor balance and needs support. Tends to turn the left foot in more than the right Reflexes: diminished and symmetric. Toes neutral. No clonus   Impression: Generalized  convulsive epilepsy (HCC) - Plan: DEPAKOTE  500 MG DR tablet  Partial epilepsy with impairment of consciousness (HCC) - Plan: DEPAKOTE  500 MG DR tablet  Insomnia, unspecified type - Plan: traZODone  (DESYREL ) 50 MG tablet  Sleep arousal disorder - Plan: traZODone  (DESYREL ) 50 MG tablet  Abnormality of gait  Recurrent falls  Poor balance  Cognitive developmental delay  Obesity, morbid (HCC)  Autism spectrum disorder  Disruptive behavior disorder  In-toeing, unspecified laterality   Recommendations for plan of care: The patient's previous Epic records were reviewed. No recent diagnostic studies to be reviewed with the patient.  Plan until next visit: Continue medications as prescribed  Continue Physical Therapy Call for questions or concerns Return in about 6 months (around 04/29/2024).  The medication list was reviewed and reconciled. No changes were made in the prescribed medications today. A complete medication list was provided to the patient.  Allergies as of 10/28/2023       Reactions   Abilify  [aripiprazole ] Swelling, Palpitations   Seroquel [quetiapine Fumarate] Palpitations   Amoxicillin -pot Clavulanate Hives, Other (See Comments)   Has patient had a PCN reaction causing immediate rash, facial/tongue/throat swelling, SOB or lightheadedness with hypotension: No Has patient had a PCN reaction causing severe rash involving mucus membranes or skin necrosis:    #  #  YES  #  #  Has patient had a PCN reaction that required hospitalization: No Has patient had a PCN reaction occurring within the last 10 years: No   Bactrim  [sulfamethoxazole -trimethoprim ] Other (See Comments)   Abdominal pain   Doxycycline  Other (See Comments)   Stomach cramps   Macrobid  [nitrofurantoin  Macrocrystal] Other (See Comments)   Sharp pain/ given with Tylenol  325 mg   Keppra [levetiracetam] Other (See Comments)   Aggressive behavior         Medication List        Accurate as of October 28, 2023 11:59 PM. If you have any questions, ask your nurse or doctor.          acetaminophen  325 MG tablet Commonly known as: TYLENOL  Take 2 tablets (650 mg total) by mouth every 6 (six) hours as needed for mild pain or moderate pain.   albuterol  (2.5 MG/3ML) 0.083% nebulizer solution Commonly known as: PROVENTIL  Take 3 mLs (2.5 mg total) by nebulization every 4 (four) hours as needed for wheezing or shortness of breath.   azelastine  0.1 % nasal spray Commonly known as: ASTELIN  Place 1 spray into both nostrils 2 (  two) times daily. Use in each nostril as directed   budesonide  1 MG/2ML nebulizer solution Commonly known as: Pulmicort  Take 2 mLs (1 mg total) by nebulization 2 (two) times daily. USE 1 VIAL VIA NEBULIZER FOUR TIMES DAILY DURING RESPITORY INFECTIONS AND ASTHMA for 2 weeks then STOP   cetirizine  10 MG tablet Commonly known as: ZYRTEC  Take 1 tablet (10 mg total) by mouth 2 (two) times daily as needed for allergies (Can use an extra dose during flare ups).   Depakote  500 MG DR tablet Generic drug: divalproex  TAKE 1 TABLET BY MOUTH IN THE MORNING AND 2 AT NIGHT   famotidine  20 MG tablet Commonly known as: PEPCID  TAKE 2 TABLETS BY MOUTH EVERY MORNING AND 1 TO 2 EVERY EVENING AS NEEDED   FeroSul 325 (65 Fe) MG tablet Generic drug: ferrous sulfate Take 325 mg by mouth daily with breakfast.   fluconazole  150 MG tablet Commonly known as: DIFLUCAN  Take 1 tablet (150 mg total) by mouth as needed (when on antibiotics).   formoterol  20 MCG/2ML nebulizer solution Commonly known as: Perforomist  Take 2 mLs (20 mcg total) by nebulization 2 (two) times daily. USE 1 VIAL VIA NEBULIZER TWICE DAILY   hydrOXYzine  25 MG tablet Commonly known as: ATARAX  Take 25 mg by mouth 3 (three) times daily as needed (pain).   ipratropium 0.03 % nasal spray Commonly known as: ATROVENT  Place 2 sprays into both nostrils every 12 (twelve) hours.   ipratropium-albuterol  0.5-2.5 (3) MG/3ML  Soln Commonly known as: DUONEB Can use one vial in nebulizer every four to six hours as needed for cough, wheeze, shortness of breath.   megestrol  40 MG tablet Commonly known as: MEGACE  Take 1 tablet (40 mg total) by mouth 2 (two) times daily. Can increase to two tablets twice a day in the event of heavy bleeding   melatonin 3 MG Tabs tablet Take 2 tablets (6mg ) by mouth at bedtime. What changed:  how much to take how to take this when to take this additional instructions   montelukast  10 MG tablet Commonly known as: SINGULAIR  Take 1 tablet (10 mg total) by mouth at bedtime.   Nebulizer Misc Adult mask with tubing. Use as directed with nebulizer device.   Portable Compressor Nebulizer Misc 1 kit by Does not apply route as needed.   Nebulizer Misc 1 kit by Does not apply route as needed.   Nebulizer Sales promotion account executive and filters   ondansetron  4 MG disintegrating tablet Commonly known as: Zofran  ODT Take 2 tablets (8 mg total) by mouth every 8 (eight) hours as needed for nausea or vomiting.   polyethylene glycol powder 17 GM/SCOOP powder Commonly known as: GLYCOLAX /MIRALAX  DISSOLVE 1 CAPFUL(17 GRAMS) IN AT LEAST 8 OUNCES OF WATER HILLIARD TWICE DAILY   Seasonique  0.15-0.03 &0.01 MG tablet Generic drug: Levonorgestrel-Ethinyl Estradiol Take 1 tablet by mouth daily.   senna 8.6 MG tablet Commonly known as: SENOKOT Take 1 tablet by mouth at bedtime.   terconazole  0.4 % vaginal cream Commonly known as: TERAZOL 7  Place 1 applicator vaginally at bedtime.   terconazole  0.8 % vaginal cream Commonly known as: TERAZOL 3  In pull-up alternate every other week with A&D   traZODone  50 MG tablet Commonly known as: DESYREL  TAKE 2 TABLETS(100 MG) BY MOUTH AT BEDTIME   vitamin A & D ointment Apply 1 Application topically See admin instructions. Put In pull up every other week alternate with Terconazole       Total time spent with the patient was 30  minutes, of which 50% or more was spent in counseling and coordination of care.  [name] Bollman NP-C [facility] [address] [place], [place] [address] Ph. [phone] Fax [phone]

## 2023-10-28 ENCOUNTER — Encounter (INDEPENDENT_AMBULATORY_CARE_PROVIDER_SITE_OTHER): Payer: Self-pay | Admitting: Family

## 2023-10-28 ENCOUNTER — Ambulatory Visit (INDEPENDENT_AMBULATORY_CARE_PROVIDER_SITE_OTHER): Payer: 59 | Admitting: Family

## 2023-10-28 VITALS — BP 120/78 | HR 112 | Ht <= 58 in | Wt 176.0 lb

## 2023-10-28 DIAGNOSIS — R296 Repeated falls: Secondary | ICD-10-CM | POA: Diagnosis not present

## 2023-10-28 DIAGNOSIS — G40209 Localization-related (focal) (partial) symptomatic epilepsy and epileptic syndromes with complex partial seizures, not intractable, without status epilepticus: Secondary | ICD-10-CM

## 2023-10-28 DIAGNOSIS — G40309 Generalized idiopathic epilepsy and epileptic syndromes, not intractable, without status epilepticus: Secondary | ICD-10-CM | POA: Diagnosis not present

## 2023-10-28 DIAGNOSIS — R2689 Other abnormalities of gait and mobility: Secondary | ICD-10-CM

## 2023-10-28 DIAGNOSIS — F840 Autistic disorder: Secondary | ICD-10-CM

## 2023-10-28 DIAGNOSIS — G4700 Insomnia, unspecified: Secondary | ICD-10-CM

## 2023-10-28 DIAGNOSIS — F819 Developmental disorder of scholastic skills, unspecified: Secondary | ICD-10-CM | POA: Diagnosis not present

## 2023-10-28 DIAGNOSIS — M205X9 Other deformities of toe(s) (acquired), unspecified foot: Secondary | ICD-10-CM

## 2023-10-28 DIAGNOSIS — F919 Conduct disorder, unspecified: Secondary | ICD-10-CM

## 2023-10-28 DIAGNOSIS — G478 Other sleep disorders: Secondary | ICD-10-CM

## 2023-10-28 DIAGNOSIS — Z6837 Body mass index (BMI) 37.0-37.9, adult: Secondary | ICD-10-CM

## 2023-10-28 DIAGNOSIS — R269 Unspecified abnormalities of gait and mobility: Secondary | ICD-10-CM

## 2023-10-28 MED ORDER — DEPAKOTE 500 MG PO TBEC: DELAYED_RELEASE_TABLET | ORAL | 5 refills | Status: AC

## 2023-10-28 MED ORDER — TRAZODONE HCL 50 MG PO TABS: ORAL_TABLET | ORAL | 5 refills | Status: AC

## 2023-10-28 NOTE — Patient Instructions (Signed)
 It was a pleasure to see you today!  Instructions for you until your next appointment are as follows: Continue giving Taydem's medications as prescribed Let me know if she has seizures or if you have any concerns Please sign up for MyChart if you have not done so. Please plan to return for follow up in 6 months or sooner if needed.  Feel free to contact our office during normal business hours at [phone] with questions or concerns. If there is no answer or the call is outside business hours, please leave a message and our clinic staff will call you back within the next business day.  If you have an urgent concern, please stay on the line for our after-hours answering service and ask for the on-call neurologist.     I also encourage you to use MyChart to communicate with me more directly. If you have not yet signed up for MyChart within [facility], the front desk staff can help you. However, please note that this inbox is NOT monitored on nights or weekends, and response can take up to 2 business days.  Urgent matters should be discussed with the on-call pediatric neurologist.   At [facility], we are committed to providing exceptional care. You will receive a patient satisfaction survey through text or email regarding your visit today. Your opinion is important to me. Comments are appreciated.

## 2023-11-03 ENCOUNTER — Encounter: Payer: Self-pay | Admitting: Physical Therapy

## 2023-11-03 ENCOUNTER — Ambulatory Visit: Admitting: Physical Therapy

## 2023-11-03 DIAGNOSIS — R2681 Unsteadiness on feet: Secondary | ICD-10-CM

## 2023-11-03 DIAGNOSIS — R2689 Other abnormalities of gait and mobility: Secondary | ICD-10-CM

## 2023-11-03 DIAGNOSIS — M6281 Muscle weakness (generalized): Secondary | ICD-10-CM

## 2023-11-03 NOTE — Therapy (Signed)
 OUTPATIENT PHYSICAL THERAPY NEURO TREATMENT NOTE   Patient Name: Lori Miles MRN: [id number] DOB:1996/02/02, 28 y.o., female Today's Date: 11/03/2023   PCP: Reinaldo Naomie RIGGERS REFERRING PROVIDER: [place], NP    PT End of Session - 11/03/23 2012     Visit Number 4    Number of Visits 7   6 visits + eval   Date for PT Re-Evaluation 11/24/23   pushed out to allow for scheduling delays   Authorization Type [facility] Medicaid DUAL    Authorization Time Period NO auth required    PT Start Time 1105    PT Stop Time 1150    PT Time Calculation (min) 45 min    Equipment Utilized During Treatment Gait belt    Activity Tolerance Patient tolerated treatment well    Behavior During Therapy WFL for tasks assessed/performed              Past Medical History:  Diagnosis Date   Asthma    Autism disorder    Bacteriuria    Chronic constipation    Cognitive developmental delay    Constipation    COVID-19 03/2020   Disruptive behavior disorder    Dyspnea    if she walks much   Family history of adverse reaction to anesthesia     hives-? if anesthesia did take antibiotic   Frequency-urgency syndrome    Gait disorder    ORGANIC PER NERUOLGIST NOTE (DR HICKLING)   Generalized convulsive epilepsy (HCC) NEUROLOGIST-  DR HICKLING   GERD (gastroesophageal reflux disease)    Gout    H/O sinus tachycardia    History of acute respiratory failure 02/14/2015   SECONDARY TO CAP   History of recurrent UTIs    Incontinent of feces    Interstitial cystitis    Intertrigo 11/29/2019   Language development disorder    answers yes and no only   Moderate intellectual disability    OSA (obstructive sleep apnea)    no machine yet    Partial epilepsy with impairment of consciousness (HCC)    FOLLOWED BY DR HICKLING   Pneumonia 2    4 times this year ( 2020)   PONV (postoperative nausea and vomiting)    Seizure (HCC)    04/26/22 over 20 years   Urinary incontinence    Past  Surgical History:  Procedure Laterality Date   CYSTOSCOPY N/A 06/03/2017   Procedure: CYSTOSCOPY WITH EXAM UNDER ANESTHESIA;  Surgeon: Nieves Cough, MD;  Location: [facility];  Service: Urology;  Laterality: N/A;   CYSTOSCOPY  2020   CYSTOSCOPY WITH HYDRODISTENSION AND BIOPSY  04/14/2012   Procedure: CYSTOSCOPY/BIOPSY/HYDRODISTENSION;  Surgeon: Thomasine Oiler, MD;  Location: [facility];  Service: Urology;;  instillation of marcaine  and pyridium    EUA/  PAP SMEAR/  BREAST EXAM  03-12-2016   dr corene [facility]   EUA/ VAGINOSCOPY/ COLPOSCOPY FO THE HYMENAL RING  10-04-2009    dr rogelio  [facility]   IR RADIOLOGIST EVAL & MGMT  07/29/2019   IR RADIOLOGIST EVAL & MGMT  09/30/2019   IR SCLEROTHERAPY OF A FLUID COLLECTION  09/08/2019   RADIOLOGY WITH ANESTHESIA N/A 09/08/2019   Procedure: IR Sclerotherapy Treatment;  Surgeon: Radiologist, Medication, MD;  Location: [facility] OR;  Service: Radiology;  Laterality: N/A;   TONSILLECTOMY     Patient Active Problem List   Diagnosis Date Noted   Other allergic rhinitis 09/01/2023   Asthma, severe persistent, well-controlled 09/01/2023   Weight gain 11/16/2022  Compulsive skin picking 11/14/2022   Poor balance 05/12/2022   Autism 04/30/2022   Well woman exam 04/30/2022   Moderate persistent asthma, uncomplicated 10/09/2021   Vitamin D deficiency 10/09/2021   Encounter for long-term (current) use of high-risk medication 12/29/2020   Menorrhagia 12/16/2020   UTI symptoms 04/10/2020   Renal cyst 11/29/2019   Intertrigo 11/29/2019   Complex care coordination 05/18/2019   Chronic bladder pain 03/03/2019   Screening for cervical cancer 02/26/2019   Glucosuria 06/01/2018   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   Not well controlled moderate persistent asthma 04/29/2018   Gastroesophageal reflux disease 04/29/2018   Recurrent infections 04/29/2018   Dysuria 02/20/2018   Chronic rhinitis 04/03/2017   Recurrent falls  03/21/2017   Weakness 03/21/2017   Venous insufficiency 03/21/2017   OSA on CPAP 09/30/2016   Excessive daytime sleepiness 03/05/2016   Insomnia 10/04/2015   Acute recurrent pansinusitis 07/11/2015   Bronchitis 05/05/2015   Abnormality of gait 03/22/2015   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   Cough variant asthma vs UACS/ vcd  03/15/2015   Pneumonia 02/14/2015   Autism spectrum disorder 02/14/2015   Nausea with vomiting 02/14/2015   Obesity, morbid (HCC) 12/13/2014   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 12/13/2014   Insomnia due to mental disorder 12/13/2014   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   Acanthosis nigricans 11/14/2014   Toeing-in 08/29/2014   Generalized convulsive epilepsy (HCC) 09/28/2012   Partial epilepsy with impairment of consciousness (HCC) 09/28/2012   Cognitive developmental delay 09/28/2012   Recurrent urinary tract infection 08/01/2011   COPD with asthma (HCC) 08/01/2011   Chronic constipation 08/01/2011   Contraception 08/01/2011   Encopresis with constipation and overflow incontinence 02/15/2011   Functional urinary incontinence 02/02/2010   Other convulsions 09/05/2009    ONSET DATE: Referral date 07-08-23  REFERRING DIAG:   Diagnosis  R26.9 (ICD-10-CM) - Abnormality of gait  R29.6 (ICD-10-CM) - Recurrent falls  R26.89 (ICD-10-CM) - Poor balance    THERAPY DIAG:  Other abnormalities of gait and mobility  Muscle weakness (generalized)  Unsteadiness on feet  Rationale for Evaluation and Treatment Rehabilitation  SUBJECTIVE:                                                                                                                                                                                              SUBJECTIVE STATEMENT: [name] reports pt had appt with [name], NP, last week; mother states that [name] told her that pt's legs would probably get weaker as she got older.  Mother states pt's legs  started tremoring and had partial collapse when she  was having her stand - with need for pt to sit down Mother reports pt has another UTI  Pt accompanied by: mother, Lori Miles  PERTINENT HISTORY: Autism spectrum disorder, h/o generalized convulsive epilepsy, steroid induced diabetes, h/o seizures, moderate intellectual disability  PAIN:  Are you having pain? pt does not express pain but mother states pt has occasional pain in her legs:  mother states she has swelling in both legs   PRECAUTIONS: None  WEIGHT BEARING RESTRICTIONS No  FALLS: Has patient fallen in last 6 months? No - mother states they have caught her several times and prevented her from falling  LIVING ENVIRONMENT: Lives with: lives with their family Lives in: House/apartment Stairs: No Has following equipment at home: Environmental consultant - 4 wheeled, Wheelchair (manual), and Tour manager  PLOF: Independent with household mobility with device, Needs assistance with ADLs, and Needs assistance with homemaking  Needs assistance with community ambulation (supervision needed due to decreased cognition)  PATIENT GOALS :  increase strength in legs, improve balance and endurance; mother states goal is to return to working out at [employer]  OBJECTIVE:   DIAGNOSTIC FINDINGS: N/A  COGNITION: Overall cognitive status: Impaired - developmental delay   SENSATION: WFL  COORDINATION: WFL's bil. LE's   POSTURE: rounded shoulders and forward head  LOWER EXTREMITY ROM:   WFL's bil. LE's   LOWER EXTREMITY MMT:  accurate assessment is questionable due to decreased cognition and decreased effort exhibited during testing  MMT Right Eval Left Eval  Hip flexion 3+ 3+  Hip extension    Hip abduction    Hip adduction    Hip internal rotation    Hip external rotation    Knee flexion 3+ 3+  Knee extension 4 3+  Ankle dorsiflexion 4 4-  Ankle plantarflexion    Ankle inversion    Ankle eversion    (Blank rows = not tested)  Pt reports  pain/discomfort with resistance with MMT  BED MOBILITY:  Independent  TRANSFERS: Assistive device utilized: None  Sit to stand: SBA Stand to sit: SBA   STAIRS:  Level of Assistance: SBA  Stair Negotiation Technique: Step to Pattern Alternating Pattern  with Bilateral Rails  Number of Stairs: 4   Height of Stairs: 6  Comments: pt ascended & descended steps using step over step sequence using rails  GAIT: Gait pattern: step through pattern; LLE internally rotated with increased supination noted in Lt foot in stance Distance walked:  Assistive device utilized: None Level of assistance: SBA Comments: see above Gait velocity:  15.44 secs =  ft/sec without device  FUNCTIONAL TESTs:   5 times sit to stand: 20.03 secs from high/low mat table without UE support Timed up and go (TUG):    23.97 secs without device  Pt unable to perform SLS on either leg due to imbalance/postural instability Pt able to pick up object off floor and return to standing without LOB - gel bottle used  - with CGA for safety     PATIENT EDUCATION: Education details: eval results with POC of 6 weeks Person educated: Parent Education method: Explanation Education comprehension: verbalized understanding   HOME EXERCISE PROGRAM: To be issued  Today's Treatment:  11-03-23  Gait:  Gait pattern: step through pattern; LLE internally rotated with increased supination noted in Lt foot in stance Distance walked: 230' x 1 rep at start of session:  115' x 1 rep at end of session with min HHA  Assistive device utilized: No Level of assistance: HHA from PT  Step training - 4 steps x 2 reps with use of bil. Handrails for support & assist with balance; step over step with ascension and step by step sequence with descension   TherEx: SciFit Level 3 x 6 with bil. Ue's and LE's for strengthening & cardiovascular conditioning  Leg press bil. LE's 50# 10 reps x 3 sets  Sit to stand from mat 5 reps - feet on  Airex - no UE support                     NeuroRe-ed:  Rockerboard 10 reps x 2 sets inside // bars with UE support  Blue foam balance beam placed inside // bars - pt performed 2 laps of sidestepping with UE support on // bars  Pt performed tandem walking on blue foam balance beam 2 reps with use of // bars for UE support  Pt stood on floor - performed tipping cone over  (4 used) and standing cone upright for improved SLS on each leg and for improved coordination - with min HHA             TherAct:   Pt performed stepping over 4 lower height orange hurdles x 2 reps with min HHA   Performed kicking bean bags (4) alternating feet 20' x 2 reps - for improved coordination of each LE and for improved SLS   GOALS: Goals reviewed with patient? Yes  SHORT TERM GOALS: Target date:  (3 weeks) 10-31-23  Amb. 350' (3 laps in gym) nonstop without device with SBA to demo increased endurance. Baseline: 115' with SBA Goal status: INITIAL  2.  Improve TUG score to </= 19 secs without device to decrease fall risk.  Baseline: 23.97 secs  Goal status: INITIAL  3.  Perform HEP for balance and strengthening with mother's assist.  Baseline:  Goal status: INITIAL   LONG TERM GOALS: Target date: 11-21-23  (6 weeks)  Pt will amb. 465' without device on flat, even surface without LOB with SBA. Baseline: 115' on 09-29-23 Goal status: INITIAL  2.  Perform at least 10 on recumbent bike nonstop for improved endurance/activity.  Baseline:  Goal status: INITIAL  3.  Improve TUG score to </= 16 secs without device to decrease fall risk.  Baseline: 23.97 secs without device Goal status: INITIAL  4.  Pt will perform sit to stand 5 times without use of UE support from mat table in </= 17 secs to demo improved LE strength. Baseline:  20.03 secs with UE support intermittently Goal status: INITIAL  5.  Provide updated HEP as appropriate to be performed with mother's assist. Baseline:  Goal status:  INITIAL  ASSESSMENT:  CLINICAL IMPRESSION: PT session focused on balance training to improve SLS on each leg and to improve SLS on compliant surfaces.  Pt did well with stepping over lower height orange hurdles with pt slightly hopping over hurdles on 1st rep with CGA.  Pt tolerated strengthening exercises well with increased participation with less persuasion needed in today's session.  Cont with POC.    OBJECTIVE IMPAIRMENTS Abnormal gait, decreased activity tolerance, decreased balance, decreased cognition, decreased endurance, and decreased strength.   ACTIVITY LIMITATIONS carrying, lifting, bending, squatting, stairs, and locomotion level  PARTICIPATION LIMITATIONS: meal prep, cleaning, laundry, and shopping  PERSONAL FACTORS Behavior pattern, Fitness, Past/current experiences, Time since onset of injury/illness/exacerbation, and 1 comorbidity: Behavior patterns/cognition are also affecting patient's functional outcome.   REHAB POTENTIAL: Good for goals established  CLINICAL DECISION MAKING:  Stable/uncomplicated  EVALUATION COMPLEXITY: Low  PLAN: PT FREQUENCY: 1x/week   PT DURATION: 6 weeks + eval   PLANNED INTERVENTIONS: Therapeutic exercises, Therapeutic activity, Neuromuscular re-education, Balance training, Gait training, Patient/Family education, Self Care, and Stair training  PLAN FOR NEXT SESSION: LE strengthening and balance training   Oliviah Agostini, [place], PT 11/03/2023, 8:16 PM

## 2023-11-05 ENCOUNTER — Telehealth (INDEPENDENT_AMBULATORY_CARE_PROVIDER_SITE_OTHER): Payer: Self-pay | Admitting: Family

## 2023-11-05 ENCOUNTER — Telehealth (INDEPENDENT_AMBULATORY_CARE_PROVIDER_SITE_OTHER): Payer: Self-pay

## 2023-11-05 NOTE — Telephone Encounter (Signed)
 Received a call from patients Pharmacy.   They called to inform us  that they are no longer able to get the Brand Name of her Depakote . Pharmacy did not want to call patients mother due to the mothers attitude.   I verbalized understanding of this.   I contacted patients mother to inform her of this. Mom stated that she just picked up a prescription from them and that they can get the medication. She also stated that she doesn't understand why they called us  with this information. Mom stated that it is the pharmacies job to find her another pharmacy to use.   I informed mom that this is not the case. Mom disagreed. We agreered to disagree and ended the call.   SS, CCMA

## 2023-11-05 NOTE — Telephone Encounter (Signed)
 I called and spoke with Mom. She said that Lori Miles had received her refill for Depakote  for this month. She said that the pharmacy didn't know if they could get it next month but would help her to locate some. She had no further questions.

## 2023-11-05 NOTE — Telephone Encounter (Signed)
 Who's calling (name and relationship to patient) : Lori Miles; mom   Best contact number:  [phone]  Provider they see: [name] Bollman, NP   Reason for call: Mom called in stating that she would like to speak with [name], regarding the pharmacist.    Call ID:      PRESCRIPTION REFILL ONLY  Name of prescription:  Pharmacy:

## 2023-11-06 ENCOUNTER — Ambulatory Visit: Admitting: Physical Therapy

## 2023-11-10 ENCOUNTER — Encounter: Payer: Self-pay | Admitting: Physical Therapy

## 2023-11-10 ENCOUNTER — Ambulatory Visit: Admitting: Physical Therapy

## 2023-11-10 DIAGNOSIS — M6281 Muscle weakness (generalized): Secondary | ICD-10-CM | POA: Diagnosis not present

## 2023-11-10 DIAGNOSIS — R2681 Unsteadiness on feet: Secondary | ICD-10-CM

## 2023-11-10 DIAGNOSIS — R2689 Other abnormalities of gait and mobility: Secondary | ICD-10-CM

## 2023-11-10 NOTE — Therapy (Signed)
 OUTPATIENT PHYSICAL THERAPY NEURO TREATMENT NOTE   Patient Name: Lori Miles MRN: [id number] DOB:April 11, 1995, 28 y.o., female Today's Date: 11/10/2023   PCP: Reinaldo Naomie RIGGERS REFERRING PROVIDER: [place], NP    PT End of Session - 11/10/23 1421     Visit Number 5    Number of Visits 7   6 visits + eval   Date for PT Re-Evaluation 11/24/23   pushed out to allow for scheduling delays   Authorization Type [facility] Medicaid DUAL    Authorization Time Period NO auth required    PT Start Time 0759    PT Stop Time 0845    PT Time Calculation (min) 46 min    Equipment Utilized During Treatment Gait belt    Activity Tolerance Patient tolerated treatment well    Behavior During Therapy WFL for tasks assessed/performed               Past Medical History:  Diagnosis Date   Asthma    Autism disorder    Bacteriuria    Chronic constipation    Cognitive developmental delay    Constipation    COVID-19 03/2020   Disruptive behavior disorder    Dyspnea    if she walks much   Family history of adverse reaction to anesthesia     hives-? if anesthesia did take antibiotic   Frequency-urgency syndrome    Gait disorder    ORGANIC PER NERUOLGIST NOTE (DR HICKLING)   Generalized convulsive epilepsy (HCC) NEUROLOGIST-  DR HICKLING   GERD (gastroesophageal reflux disease)    Gout    H/O sinus tachycardia    History of acute respiratory failure 02/14/2015   SECONDARY TO CAP   History of recurrent UTIs    Incontinent of feces    Interstitial cystitis    Intertrigo 11/29/2019   Language development disorder    answers yes and no only   Moderate intellectual disability    OSA (obstructive sleep apnea)    no machine yet    Partial epilepsy with impairment of consciousness (HCC)    FOLLOWED BY DR HICKLING   Pneumonia 2    4 times this year ( 2020)   PONV (postoperative nausea and vomiting)    Seizure (HCC)    04/26/22 over 20 years   Urinary incontinence    Past  Surgical History:  Procedure Laterality Date   CYSTOSCOPY N/A 06/03/2017   Procedure: CYSTOSCOPY WITH EXAM UNDER ANESTHESIA;  Surgeon: Nieves Cough, MD;  Location[facility];  Service: Urology;  Laterality: N/A;   CYSTOSCOPY  2020   CYSTOSCOPY WITH HYDRODISTENSION AND BIOPSY  04/14/2012   Procedure: CYSTOSCOPY/BIOPSY/HYDRODISTENSION;  Surgeon: Thomasine Oiler, MD;  Location: [facility];  Service: Urology;;  instillation of marcaine  and pyridium    EUA/  PAP SMEAR/  BREAST EXAM  03-12-2016   dr corene [facility]   EUA/ VAGINOSCOPY/ COLPOSCOPY FO THE HYMENAL RING  10-04-2009    dr rogelio  [facility]   IR RADIOLOGIST EVAL & MGMT  07/29/2019   IR RADIOLOGIST EVAL & MGMT  09/30/2019   IR SCLEROTHERAPY OF A FLUID COLLECTION  09/08/2019   RADIOLOGY WITH ANESTHESIA N/A 09/08/2019   Procedure: IR Sclerotherapy Treatment;  Surgeon: Radiologist, Medication, MD;  Location: [facility] OR;  Service: Radiology;  Laterality: N/A;   TONSILLECTOMY     Patient Active Problem List   Diagnosis Date Noted   Other allergic rhinitis 09/01/2023   Asthma, severe persistent, well-controlled 09/01/2023   Weight gain 11/16/2022  Compulsive skin picking 11/14/2022   Poor balance 05/12/2022   Autism 04/30/2022   Well woman exam 04/30/2022   Moderate persistent asthma, uncomplicated 10/09/2021   Vitamin D deficiency 10/09/2021   Encounter for long-term (current) use of high-risk medication 12/29/2020   Menorrhagia 12/16/2020   UTI symptoms 04/10/2020   Renal cyst 11/29/2019   Intertrigo 11/29/2019   Complex care coordination 05/18/2019   Chronic bladder pain 03/03/2019   Screening for cervical cancer 02/26/2019   Glucosuria 06/01/2018   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   Not well controlled moderate persistent asthma 04/29/2018   Gastroesophageal reflux disease 04/29/2018   Recurrent infections 04/29/2018   Dysuria 02/20/2018   Chronic rhinitis 04/03/2017   Recurrent falls  03/21/2017   Weakness 03/21/2017   Venous insufficiency 03/21/2017   OSA on CPAP 09/30/2016   Excessive daytime sleepiness 03/05/2016   Insomnia 10/04/2015   Acute recurrent pansinusitis 07/11/2015   Bronchitis 05/05/2015   Abnormality of gait 03/22/2015   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   Cough variant asthma vs UACS/ vcd  03/15/2015   Pneumonia 02/14/2015   Autism spectrum disorder 02/14/2015   Nausea with vomiting 02/14/2015   Obesity, morbid (HCC) 12/13/2014   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 12/13/2014   Insomnia due to mental disorder 12/13/2014   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   Acanthosis nigricans 11/14/2014   Toeing-in 08/29/2014   Generalized convulsive epilepsy (HCC) 09/28/2012   Partial epilepsy with impairment of consciousness (HCC) 09/28/2012   Cognitive developmental delay 09/28/2012   Recurrent urinary tract infection 08/01/2011   COPD with asthma (HCC) 08/01/2011   Chronic constipation 08/01/2011   Contraception 08/01/2011   Encopresis with constipation and overflow incontinence 02/15/2011   Functional urinary incontinence 02/02/2010   Other convulsions 09/05/2009    ONSET DATE: Referral date 07-08-23  REFERRING DIAG:   Diagnosis  R26.9 (ICD-10-CM) - Abnormality of gait  R29.6 (ICD-10-CM) - Recurrent falls  R26.89 (ICD-10-CM) - Poor balance    THERAPY DIAG:  Other abnormalities of gait and mobility  Muscle weakness (generalized)  Unsteadiness on feet  Rationale for Evaluation and Treatment Rehabilitation  SUBJECTIVE:                                                                                                                                                                                              SUBJECTIVE STATEMENT: [name] reports pt was prescibed another antibiotic for the UTI  - Ceftin  500 mg - prescribed by MD last week. Mother states pt is doing better today with taking antibiotic.    Rollator not fixed yet - mother states  they have been busy with appts.  Pt accompanied by: mother, [name]  PERTINENT HISTORY: Autism spectrum disorder, h/o generalized convulsive epilepsy, steroid induced diabetes, h/o seizures, moderate intellectual disability  PAIN:  Are you having pain? pt does not express pain but mother states pt has occasional pain in her legs:  mother states she has swelling in both legs   PRECAUTIONS: None  WEIGHT BEARING RESTRICTIONS No  FALLS: Has patient fallen in last 6 months? No - mother states they have caught her several times and prevented her from falling  LIVING ENVIRONMENT: Lives with: lives with their family Lives in: House/apartment Stairs: No Has following equipment at home: Environmental consultant - 4 wheeled, Wheelchair (manual), and Tour manager  PLOF: Independent with household mobility with device, Needs assistance with ADLs, and Needs assistance with homemaking  Needs assistance with community ambulation (supervision needed due to decreased cognition)  PATIENT GOALS :  increase strength in legs, improve balance and endurance; mother states goal is to return to working out at [employer]  OBJECTIVE:   DIAGNOSTIC FINDINGS: N/A  COGNITION: Overall cognitive status: Impaired - developmental delay   SENSATION: WFL  COORDINATION: WFL's bil. LE's   POSTURE: rounded shoulders and forward head  LOWER EXTREMITY ROM:   WFL's bil. LE's   LOWER EXTREMITY MMT:  accurate assessment is questionable due to decreased cognition and decreased effort exhibited during testing  MMT Right Eval Left Eval  Hip flexion 3+ 3+  Hip extension    Hip abduction    Hip adduction    Hip internal rotation    Hip external rotation    Knee flexion 3+ 3+  Knee extension 4 3+  Ankle dorsiflexion 4 4-  Ankle plantarflexion    Ankle inversion    Ankle eversion    (Blank rows = not tested)  Pt reports pain/discomfort with resistance with MMT  BED MOBILITY:   Independent  TRANSFERS: Assistive device utilized: None  Sit to stand: SBA Stand to sit: SBA   STAIRS:  Level of Assistance: SBA  Stair Negotiation Technique: Step to Pattern Alternating Pattern  with Bilateral Rails  Number of Stairs: 4   Height of Stairs: 6  Comments: pt ascended & descended steps using step over step sequence using rails  GAIT: Gait pattern: step through pattern; LLE internally rotated with increased supination noted in Lt foot in stance Distance walked:  Assistive device utilized: None Level of assistance: SBA Comments: see above Gait velocity:  15.44 secs =  ft/sec without device  FUNCTIONAL TESTs:   5 times sit to stand: 20.03 secs from high/low mat table without UE support Timed up and go (TUG):    23.97 secs without device  Pt unable to perform SLS on either leg due to imbalance/postural instability Pt able to pick up object off floor and return to standing without LOB - gel bottle used  - with CGA for safety     PATIENT EDUCATION: Education details: eval results with POC of 6 weeks Person educated: Parent Education method: Explanation Education comprehension: verbalized understanding   HOME EXERCISE PROGRAM: To be issued  Today's Treatment:  11-10-23  Gait:  Gait pattern: step through pattern; LLE internally rotated with increased supination noted in Lt foot in stance Distance walked: 350' x 1 rep (3 laps) at start of session for STG assessment Level of assistance: intermittent HHA from PT  Step training - 4 steps x 2 reps with use of bil. Handrails for support & assist with balance; step over step  with ascension and step by step sequence with descension   TherEx: SciFit Level 3 x 6 with bil. Ue's and LE's for strengthening & cardiovascular conditioning  Leg press bil. LE's 50# 10 reps x 3 sets  Sit to stand from mat 5 reps - feet on blue mat - no UE support  Step ups 10 reps RLE and 10 reps LLE onto 6 step with use of handrails  for UE support - for Rt & Lt quad strengthening            TherAct:  TUG score - 16.5 secs without device  Rockerboard 10 reps x 2 sets inside // bars with UE support; 1st rep with bil. UE support on // bars, 2nd rep with LUE support only on // bars  Pt performed stepping on stepping stones of various heights (6 used) - on floor - with min HHA x 4 laps  Standing on [employer] - touching balance bubble 3 reps each foot for improved SLS on each leg on compliant surface   4 cones placed on floor - pt performed fig. 8's around cones to improve balance with turning                GOALS: Goals reviewed with patient? Yes  SHORT TERM GOALS: Target date:  (3 weeks) 10-31-23  Amb. 350' (3 laps in gym) nonstop without device with SBA to demo increased endurance. Baseline: 68' with SBA Goal status: Goal met 11-10-23  2.  Improve TUG score to </= 19 secs without device to decrease fall risk.  Baseline: 23.97 secs:  16.5 secs without device  Goal status: Goal met 11-10-23  3.  Perform HEP for balance and strengthening with mother's assist.  Baseline:  Goal status: Goal met 11-10-23   LONG TERM GOALS: Target date: 11-21-23  (6 weeks)  Pt will amb. 465' without device on flat, even surface without LOB with SBA. Baseline: 115' on 09-29-23 Goal status: INITIAL  2.  Perform at least 10 on recumbent bike nonstop for improved endurance/activity.  Baseline:  Goal status: INITIAL  3.  Improve TUG score to </= 16 secs without device to decrease fall risk.  Baseline: 23.97 secs without device Goal status: INITIAL  4.  Pt will perform sit to stand 5 times without use of UE support from mat table in </= 17 secs to demo improved LE strength. Baseline:  20.03 secs with UE support intermittently Goal status: INITIAL  5.  Provide updated HEP as appropriate to be performed with mother's assist. Baseline:  Goal status: INITIAL  ASSESSMENT:  CLINICAL IMPRESSION: PT session focused on gait  training, standing balance, LE strengthening, and also assessment of STG's. Pt has met STG's #1-3.  TUG score has improved from 23.97 secs at eval to 16.5 secs in today's session (no device used).  Pt had increased participation in today's session, with few short rest breaks needed.  Cont with POC.    OBJECTIVE IMPAIRMENTS Abnormal gait, decreased activity tolerance, decreased balance, decreased cognition, decreased endurance, and decreased strength.   ACTIVITY LIMITATIONS carrying, lifting, bending, squatting, stairs, and locomotion level  PARTICIPATION LIMITATIONS: meal prep, cleaning, laundry, and shopping  PERSONAL FACTORS Behavior pattern, Fitness, Past/current experiences, Time since onset of injury/illness/exacerbation, and 1 comorbidity: Behavior patterns/cognition are also affecting patient's functional outcome.   REHAB POTENTIAL: Good for goals established  CLINICAL DECISION MAKING: Stable/uncomplicated  EVALUATION COMPLEXITY: Low  PLAN: PT FREQUENCY: 1x/week   PT DURATION: 6 weeks + eval   PLANNED INTERVENTIONS:  Therapeutic exercises, Therapeutic activity, Neuromuscular re-education, Balance training, Gait training, Patient/Family education, Self Care, and Stair training  PLAN FOR NEXT SESSION: LE strengthening and balance training   Osaze Hubbert, [place], PT 11/10/2023, 2:24 PM

## 2023-11-18 ENCOUNTER — Ambulatory Visit: Attending: Family | Admitting: Physical Therapy

## 2023-11-18 DIAGNOSIS — R2689 Other abnormalities of gait and mobility: Secondary | ICD-10-CM | POA: Diagnosis not present

## 2023-11-18 DIAGNOSIS — M6281 Muscle weakness (generalized): Secondary | ICD-10-CM | POA: Diagnosis not present

## 2023-11-18 DIAGNOSIS — R2681 Unsteadiness on feet: Secondary | ICD-10-CM | POA: Insufficient documentation

## 2023-11-18 NOTE — Therapy (Unsigned)
 OUTPATIENT PHYSICAL THERAPY NEURO TREATMENT NOTE   Patient Name: Lori Miles MRN: [id number] DOB:November 21, 1995, 28 y.o., female Today's Date: 11/19/2023   PCP: Reinaldo Naomie RIGGERS REFERRING PROVIDER: [place], NP    PT End of Session - 11/19/23 1910     Visit Number 6    Number of Visits 7   6 visits + eval   Date for PT Re-Evaluation 11/24/23   pushed out to allow for scheduling delays   Authorization Type [facility] Medicaid DUAL    Authorization Time Period NO auth required    PT Start Time 0848    PT Stop Time 0932    PT Time Calculation (min) 44 min    Equipment Utilized During Treatment Gait belt    Activity Tolerance Patient tolerated treatment well    Behavior During Therapy WFL for tasks assessed/performed                Past Medical History:  Diagnosis Date   Asthma    Autism disorder    Bacteriuria    Chronic constipation    Cognitive developmental delay    Constipation    COVID-19 03/2020   Disruptive behavior disorder    Dyspnea    if she walks much   Family history of adverse reaction to anesthesia     hives-? if anesthesia did take antibiotic   Frequency-urgency syndrome    Gait disorder    ORGANIC PER NERUOLGIST NOTE (DR HICKLING)   Generalized convulsive epilepsy (HCC) NEUROLOGIST-  DR HICKLING   GERD (gastroesophageal reflux disease)    Gout    H/O sinus tachycardia    History of acute respiratory failure 02/14/2015   SECONDARY TO CAP   History of recurrent UTIs    Incontinent of feces    Interstitial cystitis    Intertrigo 11/29/2019   Language development disorder    answers yes and no only   Moderate intellectual disability    OSA (obstructive sleep apnea)    no machine yet    Partial epilepsy with impairment of consciousness (HCC)    FOLLOWED BY DR HICKLING   Pneumonia 2    4 times this year ( 2020)   PONV (postoperative nausea and vomiting)    Seizure (HCC)    04/26/22 over 20 years   Urinary incontinence     Past Surgical History:  Procedure Laterality Date   CYSTOSCOPY N/A 06/03/2017   Procedure: CYSTOSCOPY WITH EXAM UNDER ANESTHESIA;  Surgeon: Nieves Cough, MD;  Location: [facility];  Service: Urology;  Laterality: N/A;   CYSTOSCOPY  2020   CYSTOSCOPY WITH HYDRODISTENSION AND BIOPSY  04/14/2012   Procedure: CYSTOSCOPY/BIOPSY/HYDRODISTENSION;  Surgeon: Thomasine Oiler, MD;  Location: [facility] [place];  Service: Urology;;  instillation of marcaine  and pyridium    EUA/  PAP SMEAR/  BREAST EXAM  03-12-2016   dr corene [facility]   EUA/ VAGINOSCOPY/ COLPOSCOPY FO THE HYMENAL RING  10-04-2009    dr rogelio  [facility]   IR RADIOLOGIST EVAL & MGMT  07/29/2019   IR RADIOLOGIST EVAL & MGMT  09/30/2019   IR SCLEROTHERAPY OF A FLUID COLLECTION  09/08/2019   RADIOLOGY WITH ANESTHESIA N/A 09/08/2019   Procedure: IR Sclerotherapy Treatment;  Surgeon: Radiologist, Medication, MD;  Location: [facility] OR;  Service: Radiology;  Laterality: N/A;   TONSILLECTOMY     Patient Active Problem List   Diagnosis Date Noted   Other allergic rhinitis 09/01/2023   Asthma, severe persistent, well-controlled 09/01/2023   Weight gain  11/16/2022   Compulsive skin picking 11/14/2022   Poor balance 05/12/2022   Autism 04/30/2022   Well woman exam 04/30/2022   Moderate persistent asthma, uncomplicated 10/09/2021   Vitamin D deficiency 10/09/2021   Encounter for long-term (current) use of high-risk medication 12/29/2020   Menorrhagia 12/16/2020   UTI symptoms 04/10/2020   Renal cyst 11/29/2019   Intertrigo 11/29/2019   Complex care coordination 05/18/2019   Chronic bladder pain 03/03/2019   Screening for cervical cancer 02/26/2019   Glucosuria 06/01/2018   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   Not well controlled moderate persistent asthma 04/29/2018   Gastroesophageal reflux disease 04/29/2018   Recurrent infections 04/29/2018   Dysuria 02/20/2018   Chronic rhinitis 04/03/2017    Recurrent falls 03/21/2017   Weakness 03/21/2017   Venous insufficiency 03/21/2017   OSA on CPAP 09/30/2016   Excessive daytime sleepiness 03/05/2016   Insomnia 10/04/2015   Acute recurrent pansinusitis 07/11/2015   Bronchitis 05/05/2015   Abnormality of gait 03/22/2015   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   Cough variant asthma vs UACS/ vcd  03/15/2015   Pneumonia 02/14/2015   Autism spectrum disorder 02/14/2015   Nausea with vomiting 02/14/2015   Obesity, morbid (HCC) 12/13/2014   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 12/13/2014   Insomnia due to mental disorder 12/13/2014   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   Acanthosis nigricans 11/14/2014   Toeing-in 08/29/2014   Generalized convulsive epilepsy (HCC) 09/28/2012   Partial epilepsy with impairment of consciousness (HCC) 09/28/2012   Cognitive developmental delay 09/28/2012   Recurrent urinary tract infection 08/01/2011   COPD with asthma (HCC) 08/01/2011   Chronic constipation 08/01/2011   Contraception 08/01/2011   Encopresis with constipation and overflow incontinence 02/15/2011   Functional urinary incontinence 02/02/2010   Other convulsions 09/05/2009    ONSET DATE: Referral date 07-08-23  REFERRING DIAG:   Diagnosis  R26.9 (ICD-10-CM) - Abnormality of gait  R29.6 (ICD-10-CM) - Recurrent falls  R26.89 (ICD-10-CM) - Poor balance    THERAPY DIAG:  Other abnormalities of gait and mobility  Muscle weakness (generalized)  Unsteadiness on feet  Rationale for Evaluation and Treatment Rehabilitation  SUBJECTIVE:                                                                                                                                                                                              SUBJECTIVE STATEMENT: [name] reports she thinks pt has a recurrent UTI: overall is doing well - no changes in status reported  Pt accompanied by: mother, [name]  PERTINENT  HISTORY: Autism spectrum disorder, h/o generalized convulsive epilepsy, steroid induced  diabetes, h/o seizures, moderate intellectual disability  PAIN:  Are you having pain? pt does not express pain but mother states pt has occasional pain in her legs:  mother states she has swelling in both legs   PRECAUTIONS: None  WEIGHT BEARING RESTRICTIONS No  FALLS: Has patient fallen in last 6 months? No - mother states they have caught her several times and prevented her from falling  LIVING ENVIRONMENT: Lives with: lives with their family Lives in: House/apartment Stairs: No Has following equipment at home: Environmental consultant - 4 wheeled, Wheelchair (manual), and Tour manager  PLOF: Independent with household mobility with device, Needs assistance with ADLs, and Needs assistance with homemaking  Needs assistance with community ambulation (supervision needed due to decreased cognition)  PATIENT GOALS :  increase strength in legs, improve balance and endurance; mother states goal is to return to working out at [employer]  OBJECTIVE:   DIAGNOSTIC FINDINGS: N/A  COGNITION: Overall cognitive status: Impaired - developmental delay   SENSATION: WFL  COORDINATION: WFL's bil. LE's   POSTURE: rounded shoulders and forward head  LOWER EXTREMITY ROM:   WFL's bil. LE's   LOWER EXTREMITY MMT:  accurate assessment is questionable due to decreased cognition and decreased effort exhibited during testing  MMT Right Eval Left Eval  Hip flexion 3+ 3+  Hip extension    Hip abduction    Hip adduction    Hip internal rotation    Hip external rotation    Knee flexion 3+ 3+  Knee extension 4 3+  Ankle dorsiflexion 4 4-  Ankle plantarflexion    Ankle inversion    Ankle eversion    (Blank rows = not tested)  Pt reports pain/discomfort with resistance with MMT  BED MOBILITY:  Independent  TRANSFERS: Assistive device utilized: None  Sit to stand: SBA Stand to sit: SBA   STAIRS:  Level of Assistance:  SBA  Stair Negotiation Technique: Step to Pattern Alternating Pattern  with Bilateral Rails  Number of Stairs: 4   Height of Stairs: 6  Comments: pt ascended & descended steps using step over step sequence using rails  GAIT: Gait pattern: step through pattern; LLE internally rotated with increased supination noted in Lt foot in stance Distance walked:  Assistive device utilized: None Level of assistance: SBA Comments: see above Gait velocity:  15.44 secs =  ft/sec without device  FUNCTIONAL TESTs:   5 times sit to stand: 20.03 secs from high/low mat table without UE support Timed up and go (TUG):    23.97 secs without device  Pt unable to perform SLS on either leg due to imbalance/postural instability Pt able to pick up object off floor and return to standing without LOB - gel bottle used  - with CGA for safety     PATIENT EDUCATION: Education details: eval results with POC of 6 weeks Person educated: Parent Education method: Explanation Education comprehension: verbalized understanding   HOME EXERCISE PROGRAM: To be issued  Today's Treatment:  11-18-23  Gait:  Gait pattern: step through pattern; LLE internally rotated with increased supination noted in Lt foot in stance Distance walked: 200' x 1 rep; 200' x 1 rep near end of session Level of assistance: intermittent HHA from PT  Step training - 4 steps x 3 reps with use of bil. Handrails for support & assist with balance; step over step with ascension and step by step sequence with descension   TherEx: SciFit Level 3 x 5 with bil. Ue's and LE's for strengthening &  cardiovascular conditioning  Leg press bil. LE's 50# 10 reps x 3 sets  Step ups 10 reps RLE and 10 reps LLE onto 6 step with use of handrails for UE support - for Rt & Lt quad strengthening            TherAct:  Pt performed side stepping on blue foam balance beam placed inside // bars for improved balance on compliant surface - 2 laps with UE support  on // bars prn  Pt performed tandem walking on blue foam balance beam - 2 reps inside // bars with UE support on // bars with CGA for improved ankle strategy in maintaining balance    NeuroRe-ed:   Pt performed cone taps to 4 cones to improve SLS on each leg - progressed to tipping cone over and standing upright with HHA for improved balance and coordination each leg  Rockerboard 10 reps x 2 sets inside // bars with UE support; 1st rep with bil. UE support on // bars, 2nd rep with LUE support only on // bars             Pt performed touching balance bubbles (3) to improve SLS on each leg - standing on floor - with CGA  - 3 reps each leg             GOALS: Goals reviewed with patient? Yes  SHORT TERM GOALS: Target date:  (3 weeks) 10-31-23  Amb. 350' (3 laps in gym) nonstop without device with SBA to demo increased endurance. Baseline: 46' with SBA Goal status: Goal met 11-10-23  2.  Improve TUG score to </= 19 secs without device to decrease fall risk.  Baseline: 23.97 secs:  16.5 secs without device  Goal status: Goal met 11-10-23  3.  Perform HEP for balance and strengthening with mother's assist.  Baseline:  Goal status: Goal met 11-10-23   LONG TERM GOALS: Target date: 11-21-23  (6 weeks)  Pt will amb. 465' without device on flat, even surface without LOB with SBA. Baseline: 115' on 09-29-23 Goal status: INITIAL  2.  Perform at least 10 on recumbent bike nonstop for improved endurance/activity.  Baseline:  Goal status: INITIAL  3.  Improve TUG score to </= 16 secs without device to decrease fall risk.  Baseline: 23.97 secs without device Goal status: INITIAL  4.  Pt will perform sit to stand 5 times without use of UE support from mat table in </= 17 secs to demo improved LE strength. Baseline:  20.03 secs with UE support intermittently Goal status: INITIAL  5.  Provide updated HEP as appropriate to be performed with mother's assist. Baseline:  Goal status:  INITIAL  ASSESSMENT:  CLINICAL IMPRESSION: PT session focused on LE strengthening, gait training without device, and balance training to improve SLS on each leg.  Pt able to position LLE in neutral with cues, as she has tendency to internally rotate LLE during gait.  Pt tolerated exercises well - requests HHA for SLS activities but able to perform activities with minimal assistance.  Cont with POC.    OBJECTIVE IMPAIRMENTS Abnormal gait, decreased activity tolerance, decreased balance, decreased cognition, decreased endurance, and decreased strength.   ACTIVITY LIMITATIONS carrying, lifting, bending, squatting, stairs, and locomotion level  PARTICIPATION LIMITATIONS: meal prep, cleaning, laundry, and shopping  PERSONAL FACTORS Behavior pattern, Fitness, Past/current experiences, Time since onset of injury/illness/exacerbation, and 1 comorbidity: Behavior patterns/cognition are also affecting patient's functional outcome.   REHAB POTENTIAL: Good for goals established  CLINICAL DECISION MAKING: Stable/uncomplicated  EVALUATION COMPLEXITY: Low  PLAN: PT FREQUENCY: 1x/week   PT DURATION: 6 weeks + eval   PLANNED INTERVENTIONS: Therapeutic exercises, Therapeutic activity, Neuromuscular re-education, Balance training, Gait training, Patient/Family education, Self Care, and Stair training  PLAN FOR NEXT SESSION: Check LTG's - D/C:  cont LE strengthening and balance training   Verta Riedlinger, [place], PT 11/19/2023, 7:14 PM

## 2023-11-19 ENCOUNTER — Encounter: Payer: Self-pay | Admitting: Physical Therapy

## 2023-11-24 ENCOUNTER — Ambulatory Visit: Admitting: Physical Therapy

## 2023-11-24 ENCOUNTER — Encounter: Payer: Self-pay | Admitting: Physical Therapy

## 2023-11-24 DIAGNOSIS — M6281 Muscle weakness (generalized): Secondary | ICD-10-CM | POA: Diagnosis not present

## 2023-11-24 DIAGNOSIS — R2689 Other abnormalities of gait and mobility: Secondary | ICD-10-CM

## 2023-11-24 DIAGNOSIS — R2681 Unsteadiness on feet: Secondary | ICD-10-CM

## 2023-11-24 NOTE — Therapy (Unsigned)
 OUTPATIENT PHYSICAL THERAPY NEURO TREATMENT NOTE/DISCHARGE SUMMARY   Patient Name: Lori Miles MRN: [id number] DOB:01-24-1996, 28 y.o., female Today's Date: 11/25/2023   PCP: Reinaldo Naomie RIGGERS REFERRING PROVIDER: [place], NP    PT End of Session - 11/25/23 2012     Visit Number 7    Number of Visits 7   6 visits + eval   Date for PT Re-Evaluation 11/24/23   pushed out to allow for scheduling delays   Authorization Type [facility] Medicaid DUAL    Authorization Time Period NO auth required    PT Start Time 0755    PT Stop Time 0845    PT Time Calculation (min) 50 min    Equipment Utilized During Treatment Gait belt    Activity Tolerance Patient tolerated treatment well    Behavior During Therapy WFL for tasks assessed/performed                 Past Medical History:  Diagnosis Date   Asthma    Autism disorder    Bacteriuria    Chronic constipation    Cognitive developmental delay    Constipation    COVID-19 03/2020   Disruptive behavior disorder    Dyspnea    if she walks much   Family history of adverse reaction to anesthesia     hives-? if anesthesia did take antibiotic   Frequency-urgency syndrome    Gait disorder    ORGANIC PER NERUOLGIST NOTE (DR HICKLING)   Generalized convulsive epilepsy (HCC) NEUROLOGIST-  DR HICKLING   GERD (gastroesophageal reflux disease)    Gout    H/O sinus tachycardia    History of acute respiratory failure 02/14/2015   SECONDARY TO CAP   History of recurrent UTIs    Incontinent of feces    Interstitial cystitis    Intertrigo 11/29/2019   Language development disorder    answers yes and no only   Moderate intellectual disability    OSA (obstructive sleep apnea)    no machine yet    Partial epilepsy with impairment of consciousness (HCC)    FOLLOWED BY DR HICKLING   Pneumonia 2    4 times this year ( 2020)   PONV (postoperative nausea and vomiting)    Seizure (HCC)    04/26/22 over 20 years   Urinary  incontinence    Past Surgical History:  Procedure Laterality Date   CYSTOSCOPY N/A 06/03/2017   Procedure: CYSTOSCOPY WITH EXAM UNDER ANESTHESIA;  Surgeon: Nieves Cough, MD;  Location: [facility];  Service: Urology;  Laterality: N/A;   CYSTOSCOPY  2020   CYSTOSCOPY WITH HYDRODISTENSION AND BIOPSY  04/14/2012   Procedure: CYSTOSCOPY/BIOPSY/HYDRODISTENSION;  Surgeon: Thomasine Oiler, MD;  Location: [facility];  Service: Urology;;  instillation of marcaine  and pyridium    EUA/  PAP SMEAR/  BREAST EXAM  03-12-2016   dr corene [facility]   EUA/ VAGINOSCOPY/ COLPOSCOPY FO THE HYMENAL RING  10-04-2009    dr rogelio  [facility]   IR RADIOLOGIST EVAL & MGMT  07/29/2019   IR RADIOLOGIST EVAL & MGMT  09/30/2019   IR SCLEROTHERAPY OF A FLUID COLLECTION  09/08/2019   RADIOLOGY WITH ANESTHESIA N/A 09/08/2019   Procedure: IR Sclerotherapy Treatment;  Surgeon: Radiologist, Medication, MD;  Location: [facility];  Service: Radiology;  Laterality: N/A;   TONSILLECTOMY     Patient Active Problem List   Diagnosis Date Noted   Other allergic rhinitis 09/01/2023   Asthma, severe persistent, well-controlled 09/01/2023  Weight gain 11/16/2022   Compulsive skin picking 11/14/2022   Poor balance 05/12/2022   Autism 04/30/2022   Well woman exam 04/30/2022   Moderate persistent asthma, uncomplicated 10/09/2021   Vitamin D deficiency 10/09/2021   Encounter for long-term (current) use of high-risk medication 12/29/2020   Menorrhagia 12/16/2020   UTI symptoms 04/10/2020   Renal cyst 11/29/2019   Intertrigo 11/29/2019   Complex care coordination 05/18/2019   Chronic bladder pain 03/03/2019   Screening for cervical cancer 02/26/2019   Glucosuria 06/01/2018   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   Not well controlled moderate persistent asthma 04/29/2018   Gastroesophageal reflux disease 04/29/2018   Recurrent infections 04/29/2018   Dysuria 02/20/2018   Chronic rhinitis  04/03/2017   Recurrent falls 03/21/2017   Weakness 03/21/2017   Venous insufficiency 03/21/2017   OSA on CPAP 09/30/2016   Excessive daytime sleepiness 03/05/2016   Insomnia 10/04/2015   Acute recurrent pansinusitis 07/11/2015   Bronchitis 05/05/2015   Abnormality of gait 03/22/2015   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   Cough variant asthma vs UACS/ vcd  03/15/2015   Pneumonia 02/14/2015   Autism spectrum disorder 02/14/2015   Nausea with vomiting 02/14/2015   Obesity, morbid (HCC) 12/13/2014   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 12/13/2014   Insomnia due to mental disorder 12/13/2014   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   Acanthosis nigricans 11/14/2014   Toeing-in 08/29/2014   Generalized convulsive epilepsy (HCC) 09/28/2012   Partial epilepsy with impairment of consciousness (HCC) 09/28/2012   Cognitive developmental delay 09/28/2012   Recurrent urinary tract infection 08/01/2011   COPD with asthma (HCC) 08/01/2011   Chronic constipation 08/01/2011   Contraception 08/01/2011   Encopresis with constipation and overflow incontinence 02/15/2011   Functional urinary incontinence 02/02/2010   Other convulsions 09/05/2009    ONSET DATE: Referral date 07-08-23  REFERRING DIAG:   Diagnosis  R26.9 (ICD-10-CM) - Abnormality of gait  R29.6 (ICD-10-CM) - Recurrent falls  R26.89 (ICD-10-CM) - Poor balance    THERAPY DIAG:  Other abnormalities of gait and mobility  Muscle weakness (generalized)  Unsteadiness on feet  Rationale for Evaluation and Treatment Rehabilitation  SUBJECTIVE:                                                                                                                                                                                              SUBJECTIVE STATEMENT: [name] reports she has another UTI because she has bumps on her face and she has been wetting too much; no other problems reported - planning  on getting brakes on rollator fixed.  Ready for  discharge today.  Pt accompanied by: mother, [name]  PERTINENT HISTORY: Autism spectrum disorder, h/o generalized convulsive epilepsy, steroid induced diabetes, h/o seizures, moderate intellectual disability  PAIN:  Are you having pain? pt does not express pain but mother states pt has occasional pain in her legs:  mother states she has swelling in both legs   PRECAUTIONS: None  WEIGHT BEARING RESTRICTIONS No  FALLS: Has patient fallen in last 6 months? No - mother states they have caught her several times and prevented her from falling  LIVING ENVIRONMENT: Lives with: lives with their family Lives in: House/apartment Stairs: No Has following equipment at home: Environmental consultant - 4 wheeled, Wheelchair (manual), and Tour manager  PLOF: Independent with household mobility with device, Needs assistance with ADLs, and Needs assistance with homemaking  Needs assistance with community ambulation (supervision needed due to decreased cognition)  PATIENT GOALS :  increase strength in legs, improve balance and endurance; mother states goal is to return to working out at [employer]  OBJECTIVE:   DIAGNOSTIC FINDINGS: N/A  COGNITION: Overall cognitive status: Impaired - developmental delay   SENSATION: WFL  COORDINATION: WFL's bil. LE's   POSTURE: rounded shoulders and forward head  LOWER EXTREMITY ROM:   WFL's bil. LE's   LOWER EXTREMITY MMT:  accurate assessment is questionable due to decreased cognition and decreased effort exhibited during testing  MMT Right Eval Left Eval  Hip flexion 3+ 3+  Hip extension    Hip abduction    Hip adduction    Hip internal rotation    Hip external rotation    Knee flexion 3+ 3+  Knee extension 4 3+  Ankle dorsiflexion 4 4-  Ankle plantarflexion    Ankle inversion    Ankle eversion    (Blank rows = not tested)  Pt reports pain/discomfort with resistance with MMT  BED MOBILITY:   Independent  TRANSFERS: Assistive device utilized: None  Sit to stand: SBA Stand to sit: SBA   STAIRS:  Level of Assistance: SBA  Stair Negotiation Technique: Step to Pattern Alternating Pattern  with Bilateral Rails  Number of Stairs: 4   Height of Stairs: 6  Comments: pt ascended & descended steps using step over step sequence using rails  GAIT: Gait pattern: step through pattern; LLE internally rotated with increased supination noted in Lt foot in stance Distance walked:  Assistive device utilized: None Level of assistance: SBA Comments: see above Gait velocity:  15.44 secs =  ft/sec without device  FUNCTIONAL TESTs:   5 times sit to stand: 20.03 secs from high/low mat table without UE support Timed up and go (TUG):    23.97 secs without device  Pt unable to perform SLS on either leg due to imbalance/postural instability Pt able to pick up object off floor and return to standing without LOB - gel bottle used  - with CGA for safety     PATIENT EDUCATION: Education details: eval results with POC of 6 weeks Person educated: Parent Education method: Explanation Education comprehension: verbalized understanding   HOME EXERCISE PROGRAM: To be issued  Today's Treatment:  11-24-23  Gait:  Gait pattern: step through pattern; LLE internally rotated with increased supination noted in Lt foot in stance Distance walked: 480' without device  Level of assistance: intermittent HHA from PT  Step training - 4 steps x 3 reps with use of bil. Handrails for support & assist with balance; step over step with ascension and step by step sequence with descension  Gait velocity:  15.66 secs, 14.75 secs = 2.22 ft/sec without device   TherEx: SciFit Level 3 x 8 with bil. Ue's and LE's for strengthening & cardiovascular conditioning  Leg press bil. LE's 50# 10 reps x 3 sets             TherAct: TUG score = 14.13 secs without device 5x sit to stand 17.5 secs without UE support  from mat table  Pt performed side stepping on blue foam balance beam placed inside // bars for improved balance on compliant surface - 2 laps with UE support on // bars prn  Pt performed tandem walking on blue foam balance beam - 2 reps inside // bars with UE support on // bars with CGA for improved ankle strategy in maintaining balance    NeuroRe-ed:   Pt performed cone taps to 4 cones to improve SLS on each leg - progressed to tipping cone over and standing upright with HHA for improved balance and coordination each leg  Rockerboard 10 reps x 2 sets inside // bars with UE support; 1st rep with bil. UE support on // bars, 2nd rep with LUE support only on // bars             Pt performed touching balance bubbles (3) to improve SLS on each leg - standing on floor - with CGA  - 3 reps each leg              Pt performed SLS on each leg;  RLE 9 secs:  LLE >10 secs           GOALS: Goals reviewed with patient? Yes  SHORT TERM GOALS: Target date:  (3 weeks) 10-31-23  Amb. 350' (3 laps in gym) nonstop without device with SBA to demo increased endurance. Baseline: 70' with SBA Goal status: Goal met 11-10-23  2.  Improve TUG score to </= 19 secs without device to decrease fall risk.  Baseline: 23.97 secs:  16.5 secs without device  Goal status: Goal met 11-10-23  3.  Perform HEP for balance and strengthening with mother's assist.  Baseline:  Goal status: Goal met 11-10-23   LONG TERM GOALS: Target date: 11-21-23  (6 weeks)  Pt will amb. 465' without device on flat, even surface without LOB with SBA. Baseline: 115' on 09-29-23; 480' on 11-24-23  Goal status: Goal met 11-24-23  2.  Perform at least 10 on recumbent bike nonstop for improved endurance/activity.  Baseline:  Goal status:  Partially met 11-24-23 - pt performed 8 on Scifit  3.  Improve TUG score to </= 16 secs without device to decrease fall risk.  Baseline: 23.97 secs without device; 14.13 secs without device Goal status:  Goal met 11-24-23  4.  Pt will perform sit to stand 5 times without use of UE support from mat table in </= 17 secs to demo improved LE strength. Baseline:  20.03 secs with UE support intermittently;  17.5 secs Goal status: Goal partially met 11-24-23  5.  Provide updated HEP as appropriate to be performed with mother's assist. Baseline:  Goal status: Goal met 11-24-23  ASSESSMENT:  CLINICAL IMPRESSION: Pt has met LTG's #1, 3, & 5:  LTG #2 partially met as pt performed 8 continuous activity on SciFit in today's session - LTG set for 10;  LTG #4 partially met as pt performed 5x sit to stand transfers without UE support from mat in 17.5 secs (goal set for </= 17 secs).  Pt ambulating without device modified independently.  Mother reports pt has rollator for assistance with community ambulation.  Pt is discharged due to completion of program and plateau in maximizing functional progress at this time.  No further needs identified at this time.      OBJECTIVE IMPAIRMENTS Abnormal gait, decreased activity tolerance, decreased balance, decreased cognition, decreased endurance, and decreased strength.   ACTIVITY LIMITATIONS carrying, lifting, bending, squatting, stairs, and locomotion level  PARTICIPATION LIMITATIONS: meal prep, cleaning, laundry, and shopping  PERSONAL FACTORS Behavior pattern, Fitness, Past/current experiences, Time since onset of injury/illness/exacerbation, and 1 comorbidity: Behavior patterns/cognition are also affecting patient's functional outcome.   REHAB POTENTIAL: Good for goals established  CLINICAL DECISION MAKING: Stable/uncomplicated  EVALUATION COMPLEXITY: Low  PLAN: PT FREQUENCY: 1x/week   PT DURATION: 6 weeks + eval   PLANNED INTERVENTIONS: Therapeutic exercises, Therapeutic activity, Neuromuscular re-education, Balance training, Gait training, Patient/Family education, Self Care, and Stair training  PLAN FOR NEXT SESSION: N/A - D/C on 11-24-23   PHYSICAL  THERAPY DISCHARGE SUMMARY  Visits from Start of Care: 7  Current functional level related to goals / functional outcomes: See above for progress towards goals   Remaining deficits: Continued mild gait deviations with LLE internally rotated at times - pt able to externally rotate LLE with cues Decreased high level balance skills   Education / Equipment: Pt and mother have been instructed in HEP for LE strengthening and balance exercises. Continue to recommend going to [facility] for walking program and use of recumbent bike for aerobic exercise.    Patient agrees to discharge. Patient goals were partially met. Patient is being discharged due to meeting the stated rehab goals. Pt has plateaued in maximizing functional progress at this time.  No further needs identified at this time.   Roxanna [place], PT 11/25/2023, 8:38 PM

## 2023-11-26 DIAGNOSIS — H53143 Visual discomfort, bilateral: Secondary | ICD-10-CM | POA: Diagnosis not present

## 2023-11-26 DIAGNOSIS — H1045 Other chronic allergic conjunctivitis: Secondary | ICD-10-CM | POA: Diagnosis not present

## 2023-12-01 ENCOUNTER — Ambulatory Visit: Admitting: Physical Therapy

## 2023-12-05 DIAGNOSIS — F819 Developmental disorder of scholastic skills, unspecified: Secondary | ICD-10-CM | POA: Diagnosis not present

## 2023-12-05 DIAGNOSIS — N3010 Interstitial cystitis (chronic) without hematuria: Secondary | ICD-10-CM | POA: Diagnosis not present

## 2023-12-05 DIAGNOSIS — Z87440 Personal history of urinary (tract) infections: Secondary | ICD-10-CM | POA: Diagnosis not present

## 2023-12-18 DIAGNOSIS — R8279 Other abnormal findings on microbiological examination of urine: Secondary | ICD-10-CM | POA: Diagnosis not present

## 2023-12-19 ENCOUNTER — Other Ambulatory Visit: Payer: Self-pay | Admitting: Allergy and Immunology

## 2023-12-23 ENCOUNTER — Other Ambulatory Visit: Payer: Self-pay | Admitting: Family Medicine

## 2023-12-24 DIAGNOSIS — L249 Irritant contact dermatitis, unspecified cause: Secondary | ICD-10-CM | POA: Diagnosis not present

## 2023-12-24 DIAGNOSIS — L304 Erythema intertrigo: Secondary | ICD-10-CM | POA: Diagnosis not present

## 2023-12-24 DIAGNOSIS — L700 Acne vulgaris: Secondary | ICD-10-CM | POA: Diagnosis not present

## 2024-02-19 ENCOUNTER — Other Ambulatory Visit: Payer: Self-pay | Admitting: Internal Medicine

## 2024-04-01 ENCOUNTER — Other Ambulatory Visit: Payer: Self-pay | Admitting: Family Medicine

## 2024-04-01 ENCOUNTER — Other Ambulatory Visit: Payer: Self-pay | Admitting: Obstetrics and Gynecology

## 2024-04-07 ENCOUNTER — Other Ambulatory Visit: Payer: Self-pay | Admitting: Family Medicine

## 2024-04-08 ENCOUNTER — Other Ambulatory Visit: Payer: Self-pay

## 2024-04-27 ENCOUNTER — Ambulatory Visit (INDEPENDENT_AMBULATORY_CARE_PROVIDER_SITE_OTHER): Admitting: Family
